# Patient Record
Sex: Male | Born: 1950 | Race: Black or African American | Hispanic: No | Smoking: Never smoker
Health system: Southern US, Community
[De-identification: ages and names within clinical notes are randomized; demographics above are authoritative.]

## PROBLEM LIST (undated history)

## (undated) DIAGNOSIS — Z950 Presence of cardiac pacemaker: Secondary | ICD-10-CM

## (undated) DIAGNOSIS — I5023 Acute on chronic systolic (congestive) heart failure: Secondary | ICD-10-CM

## (undated) DIAGNOSIS — I2510 Atherosclerotic heart disease of native coronary artery without angina pectoris: Secondary | ICD-10-CM

## (undated) DIAGNOSIS — N183 Chronic kidney disease, stage 3 unspecified: Secondary | ICD-10-CM

## (undated) DIAGNOSIS — E7800 Pure hypercholesterolemia, unspecified: Secondary | ICD-10-CM

## (undated) DIAGNOSIS — I4891 Unspecified atrial fibrillation: Secondary | ICD-10-CM

## (undated) DIAGNOSIS — K219 Gastro-esophageal reflux disease without esophagitis: Secondary | ICD-10-CM

## (undated) DIAGNOSIS — G629 Polyneuropathy, unspecified: Secondary | ICD-10-CM

## (undated) DIAGNOSIS — D869 Sarcoidosis, unspecified: Secondary | ICD-10-CM

## (undated) DIAGNOSIS — R972 Elevated prostate specific antigen [PSA]: Principal | ICD-10-CM

## (undated) DIAGNOSIS — I219 Acute myocardial infarction, unspecified: Secondary | ICD-10-CM

## (undated) DIAGNOSIS — I639 Cerebral infarction, unspecified: Secondary | ICD-10-CM

## (undated) DIAGNOSIS — R519 Headache, unspecified: Secondary | ICD-10-CM

## (undated) DIAGNOSIS — Z95810 Presence of automatic (implantable) cardiac defibrillator: Secondary | ICD-10-CM

## (undated) DIAGNOSIS — I420 Dilated cardiomyopathy: Secondary | ICD-10-CM

## (undated) DIAGNOSIS — I472 Ventricular tachycardia: Secondary | ICD-10-CM

## (undated) DIAGNOSIS — Z9981 Dependence on supplemental oxygen: Secondary | ICD-10-CM

## (undated) DIAGNOSIS — I4729 Other ventricular tachycardia: Secondary | ICD-10-CM

## (undated) DIAGNOSIS — I209 Angina pectoris, unspecified: Secondary | ICD-10-CM

## (undated) DIAGNOSIS — R51 Headache: Secondary | ICD-10-CM

## (undated) DIAGNOSIS — F419 Anxiety disorder, unspecified: Secondary | ICD-10-CM

## (undated) DIAGNOSIS — I509 Heart failure, unspecified: Secondary | ICD-10-CM

## (undated) DIAGNOSIS — M109 Gout, unspecified: Secondary | ICD-10-CM

## (undated) DIAGNOSIS — I10 Essential (primary) hypertension: Secondary | ICD-10-CM

## (undated) HISTORY — DX: Presence of automatic (implantable) cardiac defibrillator: Z95.810

## (undated) HISTORY — DX: Gastro-esophageal reflux disease without esophagitis: K21.9

## (undated) HISTORY — DX: Chronic kidney disease, stage 3 unspecified: N18.30

## (undated) HISTORY — DX: Cerebral infarction, unspecified: I63.9

## (undated) HISTORY — DX: Ventricular tachycardia: I47.2

## (undated) HISTORY — DX: Other ventricular tachycardia: I47.29

## (undated) HISTORY — DX: Pure hypercholesterolemia, unspecified: E78.00

## (undated) HISTORY — DX: Acute on chronic systolic (congestive) heart failure: I50.23

## (undated) HISTORY — DX: Unspecified atrial fibrillation: I48.91

## (undated) HISTORY — PX: BACK SURGERY: SHX140

## (undated) HISTORY — DX: Elevated prostate specific antigen (PSA): R97.20

## (undated) HISTORY — DX: Chronic kidney disease, stage 3 (moderate): N18.3

## (undated) HISTORY — PX: INSERT / REPLACE / REMOVE PACEMAKER: SUR710

## (undated) HISTORY — PX: CARDIAC CATHETERIZATION: SHX172

---

## 1997-11-21 ENCOUNTER — Emergency Department (HOSPITAL_COMMUNITY): Admission: EM | Admit: 1997-11-21 | Discharge: 1997-11-21 | Payer: Self-pay | Admitting: Emergency Medicine

## 1999-11-30 ENCOUNTER — Encounter: Payer: Self-pay | Admitting: Emergency Medicine

## 1999-11-30 ENCOUNTER — Emergency Department (HOSPITAL_COMMUNITY): Admission: EM | Admit: 1999-11-30 | Discharge: 1999-11-30 | Payer: Self-pay | Admitting: Emergency Medicine

## 2000-03-06 ENCOUNTER — Emergency Department (HOSPITAL_COMMUNITY): Admission: EM | Admit: 2000-03-06 | Discharge: 2000-03-06 | Payer: Self-pay | Admitting: Emergency Medicine

## 2000-03-06 ENCOUNTER — Encounter: Payer: Self-pay | Admitting: Emergency Medicine

## 2000-11-24 ENCOUNTER — Emergency Department (HOSPITAL_COMMUNITY): Admission: EM | Admit: 2000-11-24 | Discharge: 2000-11-24 | Payer: Self-pay | Admitting: Emergency Medicine

## 2001-06-01 ENCOUNTER — Encounter: Payer: Self-pay | Admitting: Emergency Medicine

## 2001-06-01 ENCOUNTER — Emergency Department (HOSPITAL_COMMUNITY): Admission: EM | Admit: 2001-06-01 | Discharge: 2001-06-01 | Payer: Self-pay | Admitting: Emergency Medicine

## 2002-01-29 ENCOUNTER — Encounter: Payer: Self-pay | Admitting: Emergency Medicine

## 2002-01-29 ENCOUNTER — Emergency Department (HOSPITAL_COMMUNITY): Admission: EM | Admit: 2002-01-29 | Discharge: 2002-01-29 | Payer: Self-pay | Admitting: Emergency Medicine

## 2006-04-26 ENCOUNTER — Inpatient Hospital Stay (HOSPITAL_COMMUNITY): Admission: EM | Admit: 2006-04-26 | Discharge: 2006-05-02 | Payer: Self-pay | Admitting: Emergency Medicine

## 2006-10-25 ENCOUNTER — Emergency Department (HOSPITAL_COMMUNITY): Admission: EM | Admit: 2006-10-25 | Discharge: 2006-10-25 | Payer: Self-pay | Admitting: Emergency Medicine

## 2007-01-03 HISTORY — PX: PACEMAKER INSERTION: SHX728

## 2007-01-06 ENCOUNTER — Ambulatory Visit: Payer: Self-pay | Admitting: Internal Medicine

## 2007-01-06 ENCOUNTER — Inpatient Hospital Stay (HOSPITAL_COMMUNITY): Admission: EM | Admit: 2007-01-06 | Discharge: 2007-01-11 | Payer: Self-pay | Admitting: Emergency Medicine

## 2007-01-08 ENCOUNTER — Encounter (INDEPENDENT_AMBULATORY_CARE_PROVIDER_SITE_OTHER): Payer: Self-pay | Admitting: Cardiology

## 2007-01-14 ENCOUNTER — Ambulatory Visit: Payer: Self-pay | Admitting: Internal Medicine

## 2007-01-14 ENCOUNTER — Ambulatory Visit (HOSPITAL_COMMUNITY): Admission: RE | Admit: 2007-01-14 | Discharge: 2007-01-15 | Payer: Self-pay | Admitting: Internal Medicine

## 2007-01-17 ENCOUNTER — Inpatient Hospital Stay (HOSPITAL_COMMUNITY): Admission: EM | Admit: 2007-01-17 | Discharge: 2007-01-19 | Payer: Self-pay | Admitting: Emergency Medicine

## 2007-01-17 ENCOUNTER — Ambulatory Visit: Payer: Self-pay | Admitting: Internal Medicine

## 2007-01-18 ENCOUNTER — Encounter: Payer: Self-pay | Admitting: Internal Medicine

## 2007-01-30 ENCOUNTER — Ambulatory Visit: Payer: Self-pay

## 2007-02-21 ENCOUNTER — Emergency Department (HOSPITAL_COMMUNITY): Admission: EM | Admit: 2007-02-21 | Discharge: 2007-02-21 | Payer: Self-pay | Admitting: Emergency Medicine

## 2007-03-05 ENCOUNTER — Emergency Department (HOSPITAL_COMMUNITY): Admission: EM | Admit: 2007-03-05 | Discharge: 2007-03-05 | Payer: Self-pay | Admitting: Emergency Medicine

## 2007-03-05 ENCOUNTER — Ambulatory Visit: Payer: Self-pay | Admitting: Cardiology

## 2007-03-20 ENCOUNTER — Ambulatory Visit: Payer: Self-pay | Admitting: Internal Medicine

## 2007-03-27 ENCOUNTER — Emergency Department (HOSPITAL_COMMUNITY): Admission: EM | Admit: 2007-03-27 | Discharge: 2007-03-27 | Payer: Self-pay | Admitting: Emergency Medicine

## 2007-04-23 ENCOUNTER — Inpatient Hospital Stay (HOSPITAL_COMMUNITY): Admission: AD | Admit: 2007-04-23 | Discharge: 2007-04-26 | Payer: Self-pay | Admitting: *Deleted

## 2007-06-30 ENCOUNTER — Inpatient Hospital Stay (HOSPITAL_COMMUNITY): Admission: EM | Admit: 2007-06-30 | Discharge: 2007-07-02 | Payer: Self-pay | Admitting: Emergency Medicine

## 2007-08-05 ENCOUNTER — Inpatient Hospital Stay (HOSPITAL_COMMUNITY): Admission: AD | Admit: 2007-08-05 | Discharge: 2007-08-09 | Payer: Self-pay | Admitting: *Deleted

## 2007-09-17 ENCOUNTER — Encounter (INDEPENDENT_AMBULATORY_CARE_PROVIDER_SITE_OTHER): Payer: Self-pay | Admitting: *Deleted

## 2007-09-17 ENCOUNTER — Ambulatory Visit (HOSPITAL_COMMUNITY): Admission: RE | Admit: 2007-09-17 | Discharge: 2007-09-17 | Payer: Self-pay | Admitting: *Deleted

## 2007-09-17 ENCOUNTER — Ambulatory Visit: Payer: Self-pay | Admitting: Vascular Surgery

## 2007-09-20 ENCOUNTER — Emergency Department (HOSPITAL_COMMUNITY): Admission: EM | Admit: 2007-09-20 | Discharge: 2007-09-20 | Payer: Self-pay | Admitting: Emergency Medicine

## 2007-10-08 IMAGING — CR DG CHEST 2V
2 series · 2 of 2 positions shown · non-contrast
Comparison: None.

CLINICAL DATA: Short of breath. 
 CHEST - 2 VIEW:

[w chest pa]
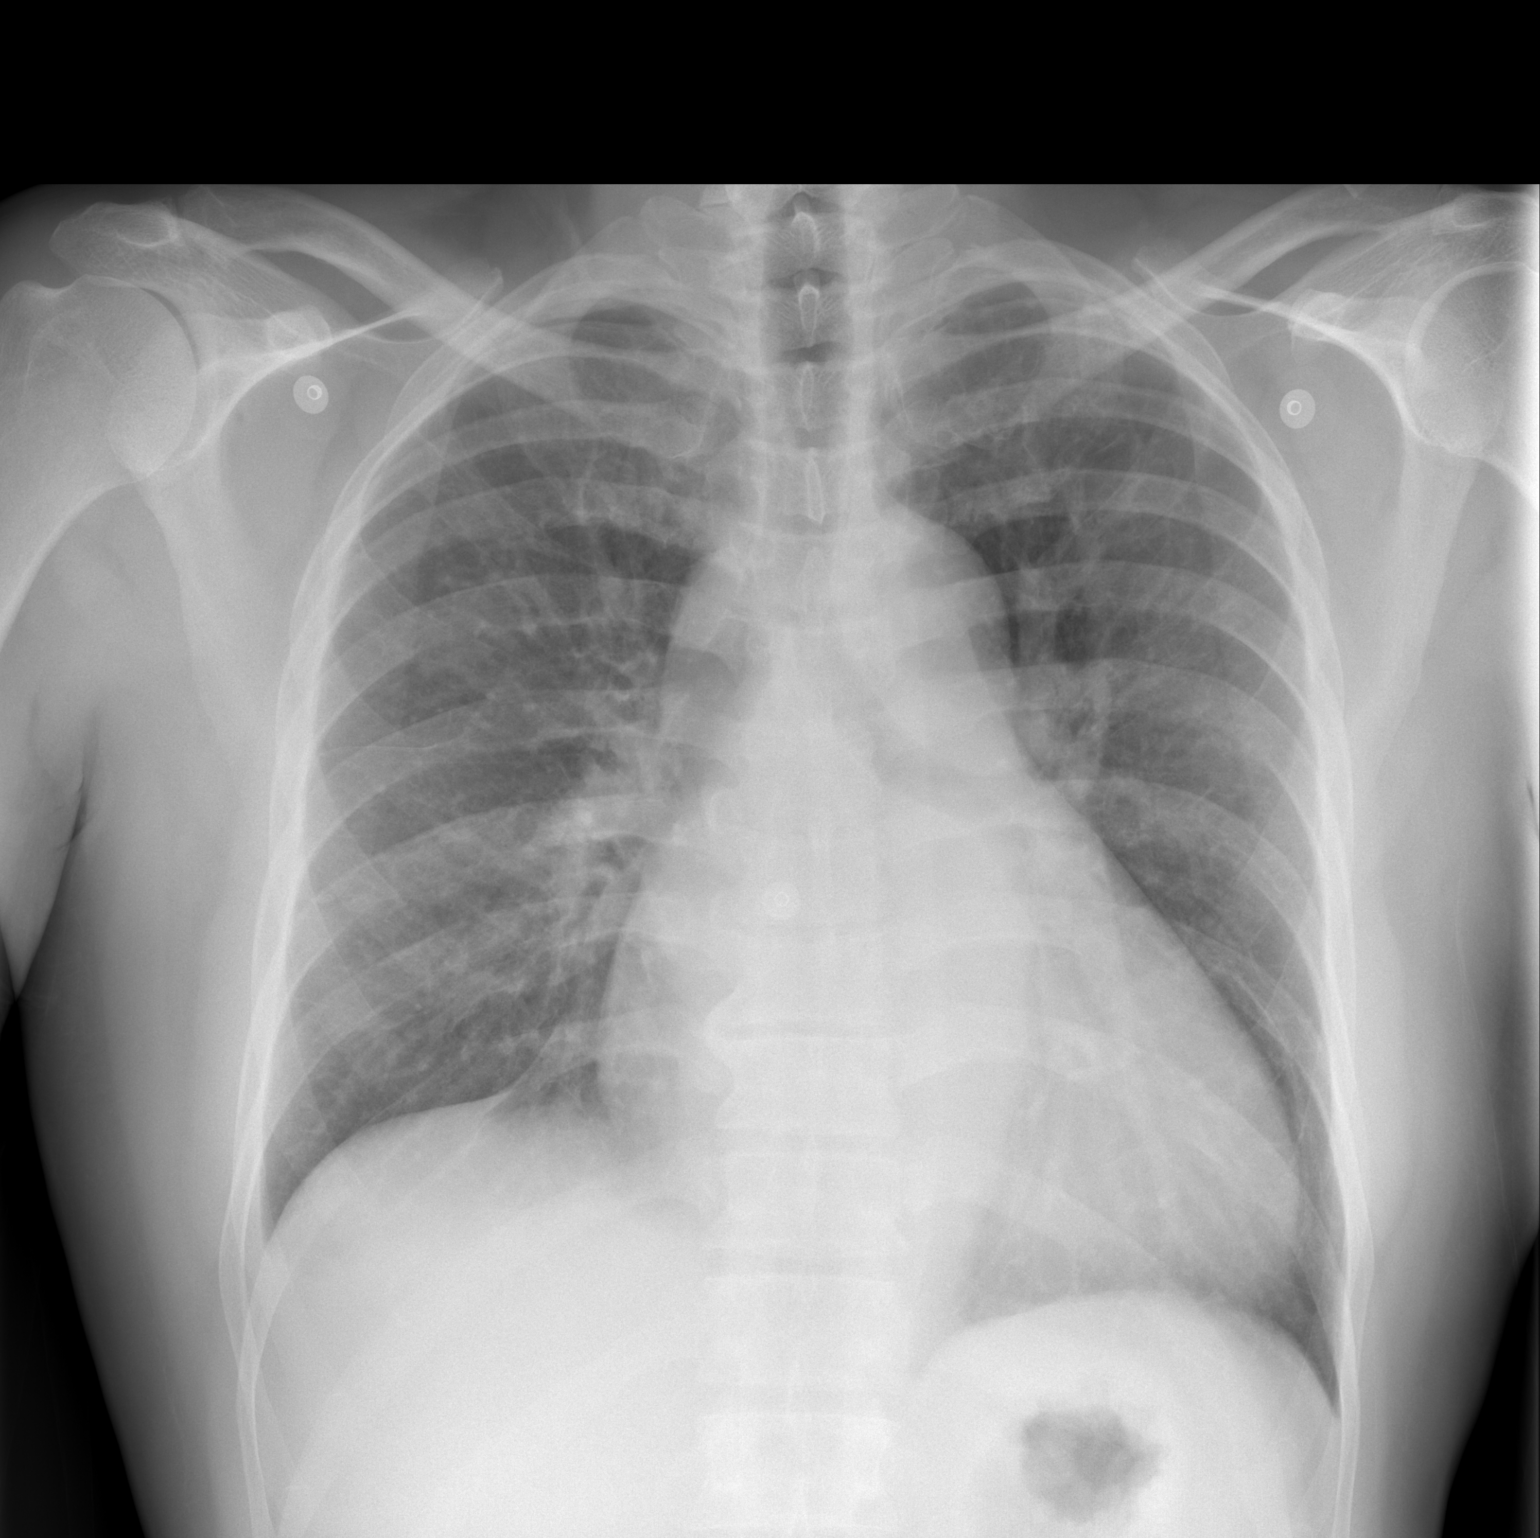

[w chest lat]
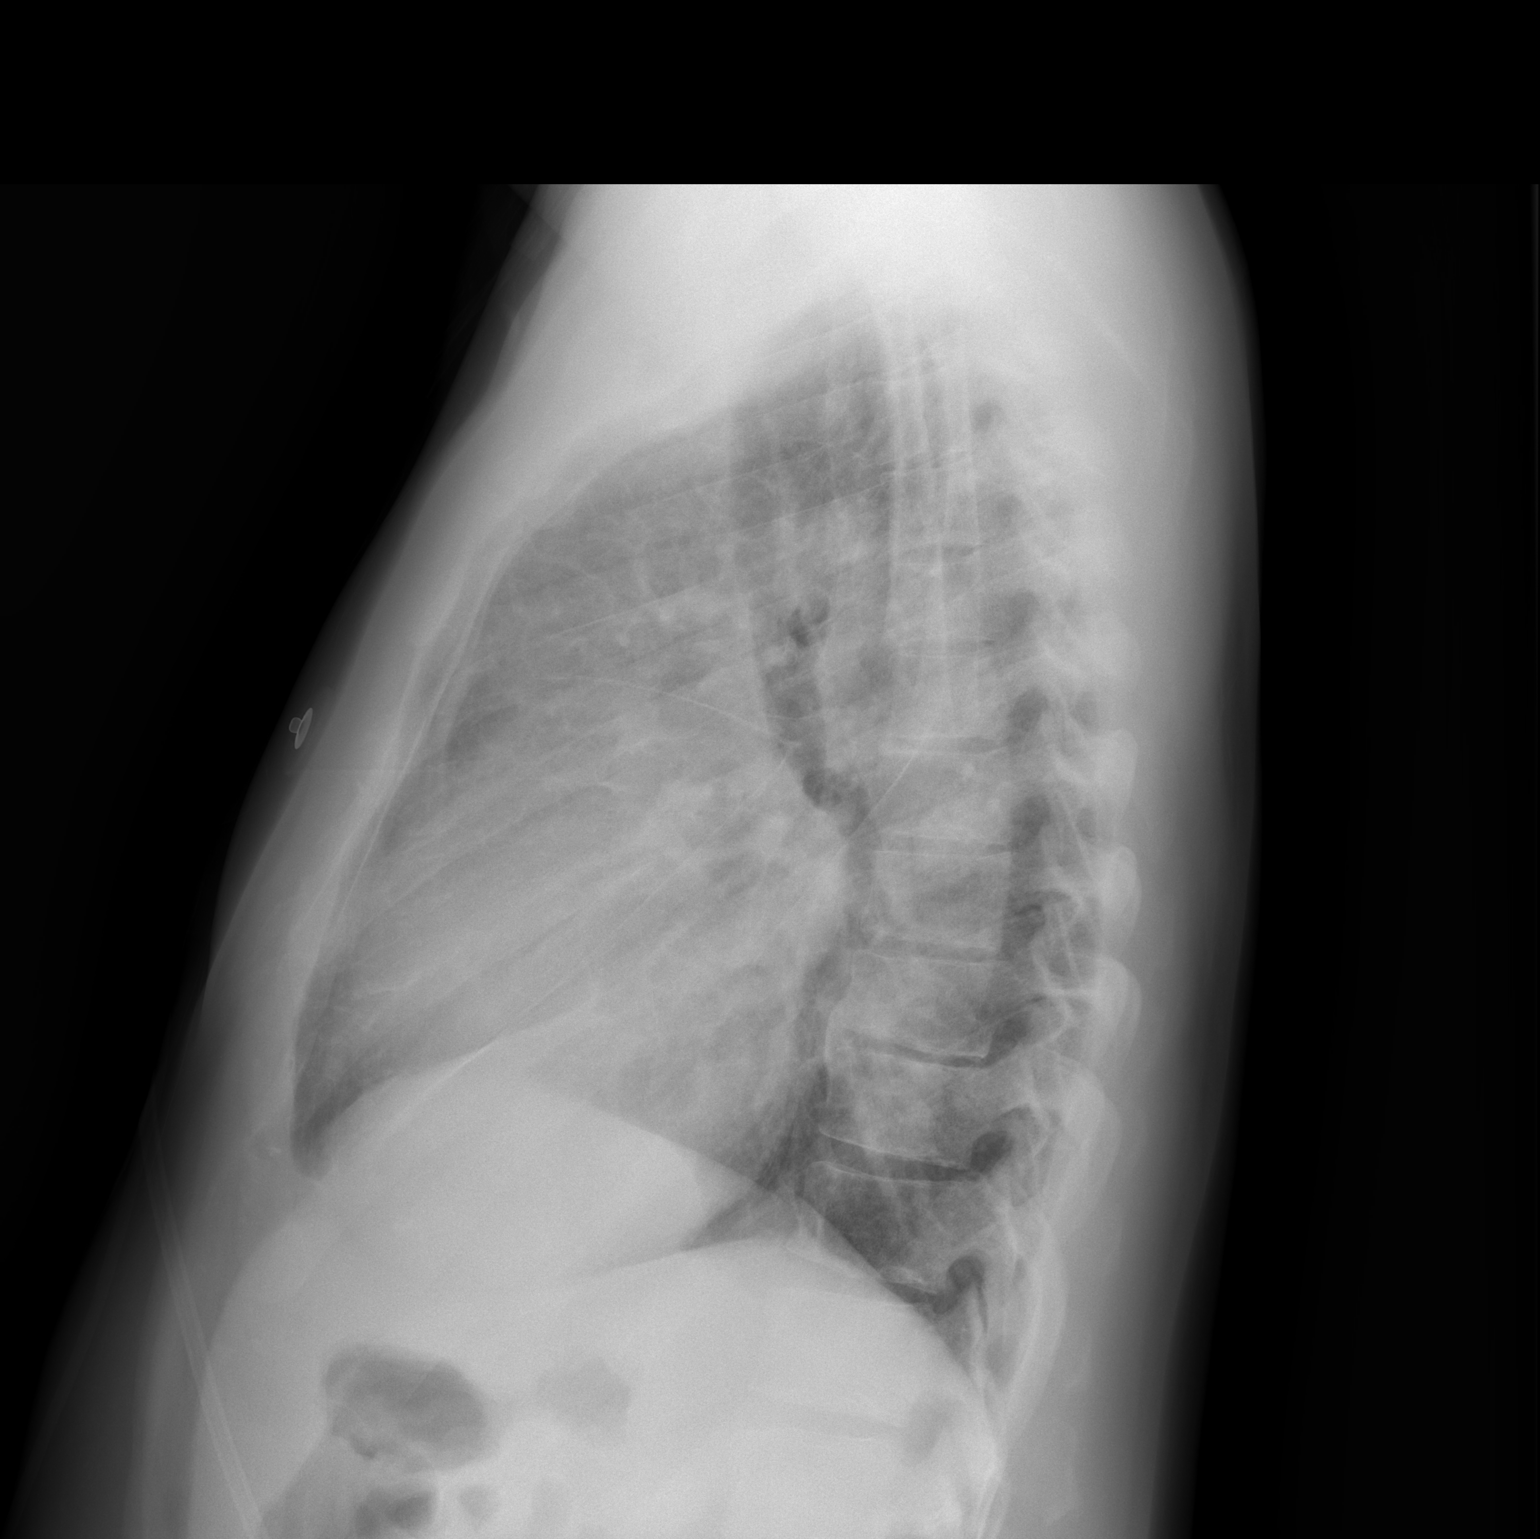

[2 of 2 positions shown; findings below may reference images not displayed]

FINDINGS: The heart is enlarged.  There is venous hypertension without frank congestive heart failure.  There may be a few Aufmim?Muri lines but there is no pleural fluid.  There is no pulmonary air space disease.
IMPRESSION: Cardiomegaly with venous hypertension ? no frank congestive heart failure or active disease.

## 2008-01-27 ENCOUNTER — Inpatient Hospital Stay (HOSPITAL_COMMUNITY): Admission: EM | Admit: 2008-01-27 | Discharge: 2008-02-05 | Payer: Self-pay | Admitting: Emergency Medicine

## 2008-01-27 ENCOUNTER — Ambulatory Visit: Payer: Self-pay | Admitting: Cardiology

## 2008-02-04 ENCOUNTER — Encounter (INDEPENDENT_AMBULATORY_CARE_PROVIDER_SITE_OTHER): Payer: Self-pay | Admitting: Gastroenterology

## 2008-04-07 IMAGING — CR DG CHEST 1V PORT
1 series · 1 of 1 positions shown · non-contrast
Comparison: 04/26/06.

CLINICAL DATA: Chest pain.  Short of breath.  Cough.
 PORTABLE CHEST ([DATE]):

[view not recorded]
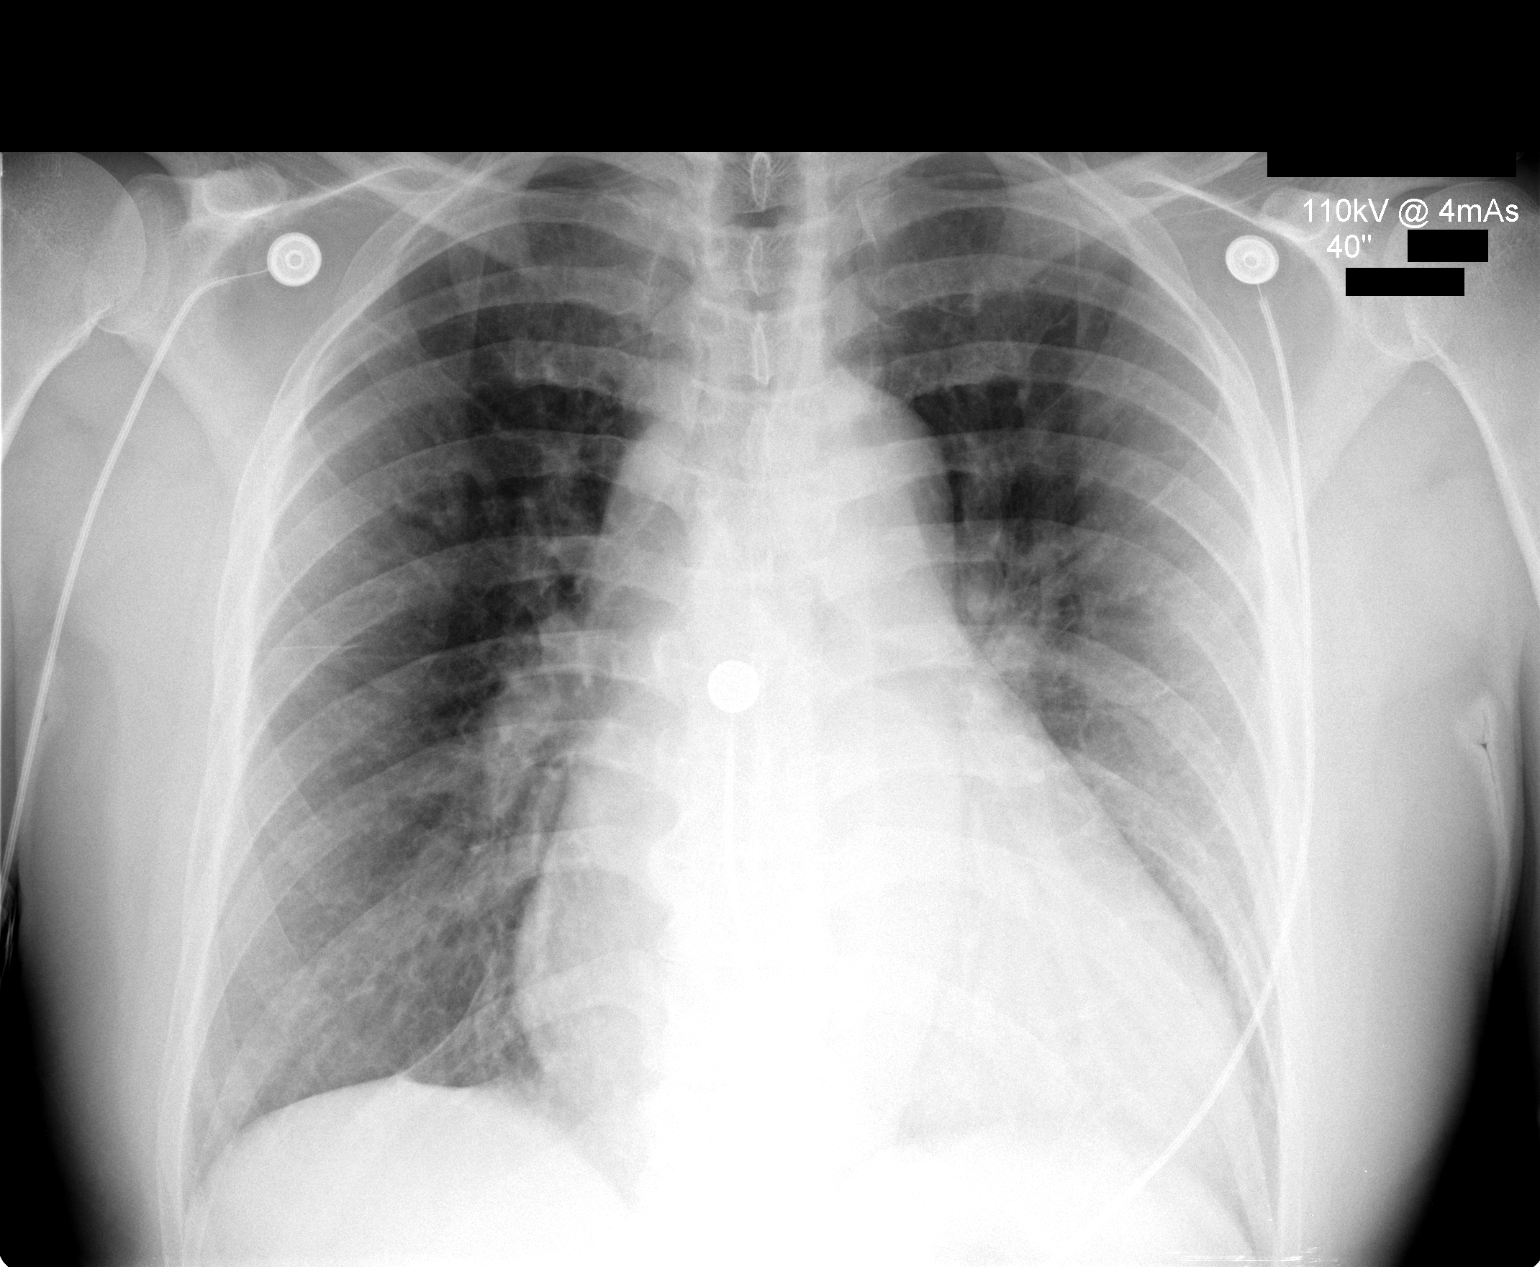

[1 of 1 positions shown; findings below may reference images not displayed]

FINDINGS: Heart size is enlarged.  There is mild vascular congestion without edema.  There is some mild left lower lobe atelectasis.  There is no effusion.
IMPRESSION: Cardiac enlargement with vascular congestion.  No significant edema.  There is mild left lower lobe atelectasis.

## 2008-06-21 ENCOUNTER — Emergency Department (HOSPITAL_COMMUNITY): Admission: EM | Admit: 2008-06-21 | Discharge: 2008-06-21 | Payer: Self-pay | Admitting: Emergency Medicine

## 2008-06-28 IMAGING — CR DG CHEST 2V
2 series · 2 of 2 positions shown · non-contrast
Comparison: 01/06/07.

CLINICAL DATA: Post pacer insertion.
 CHEST ? 2 VIEW:

[w chest pa]
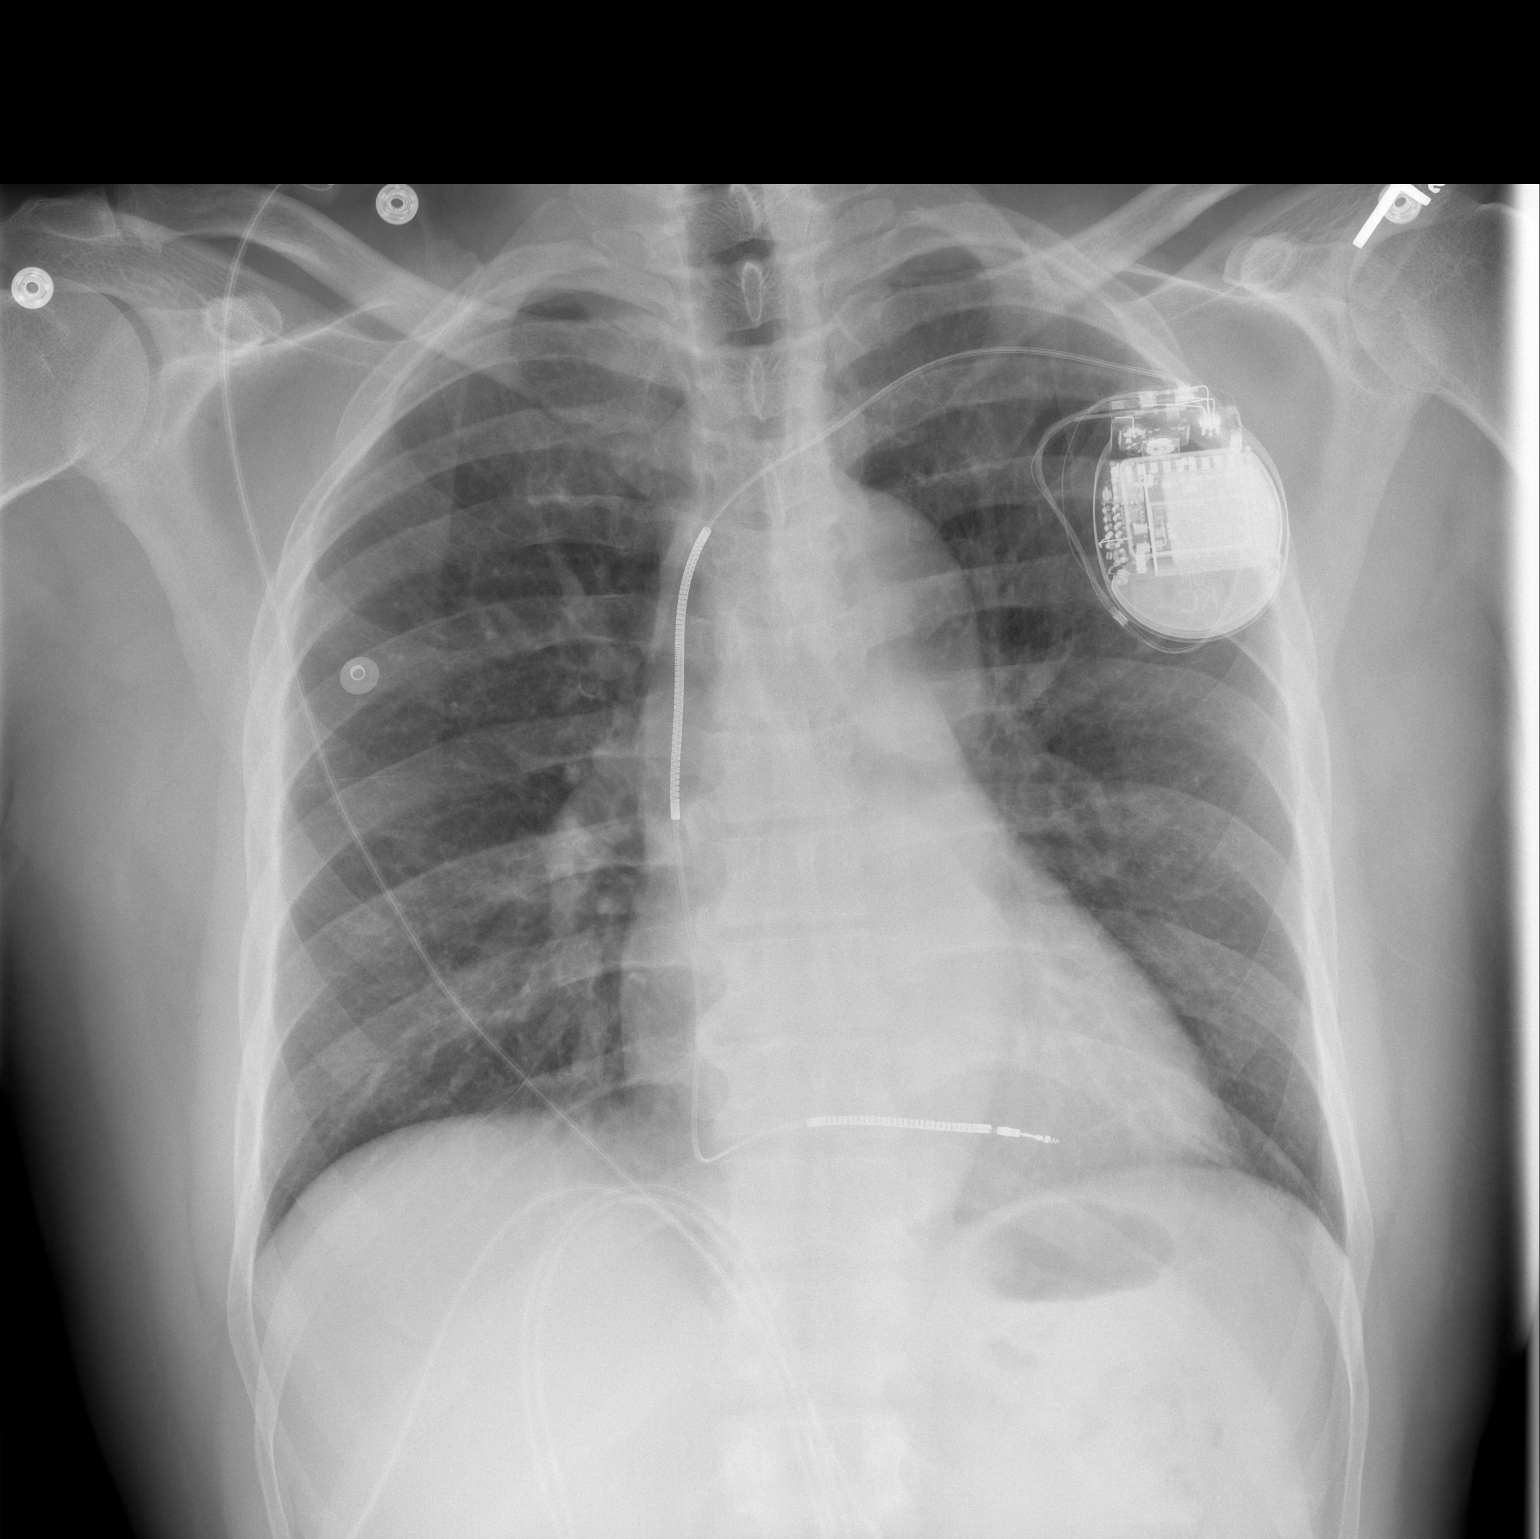

[w chest lat]
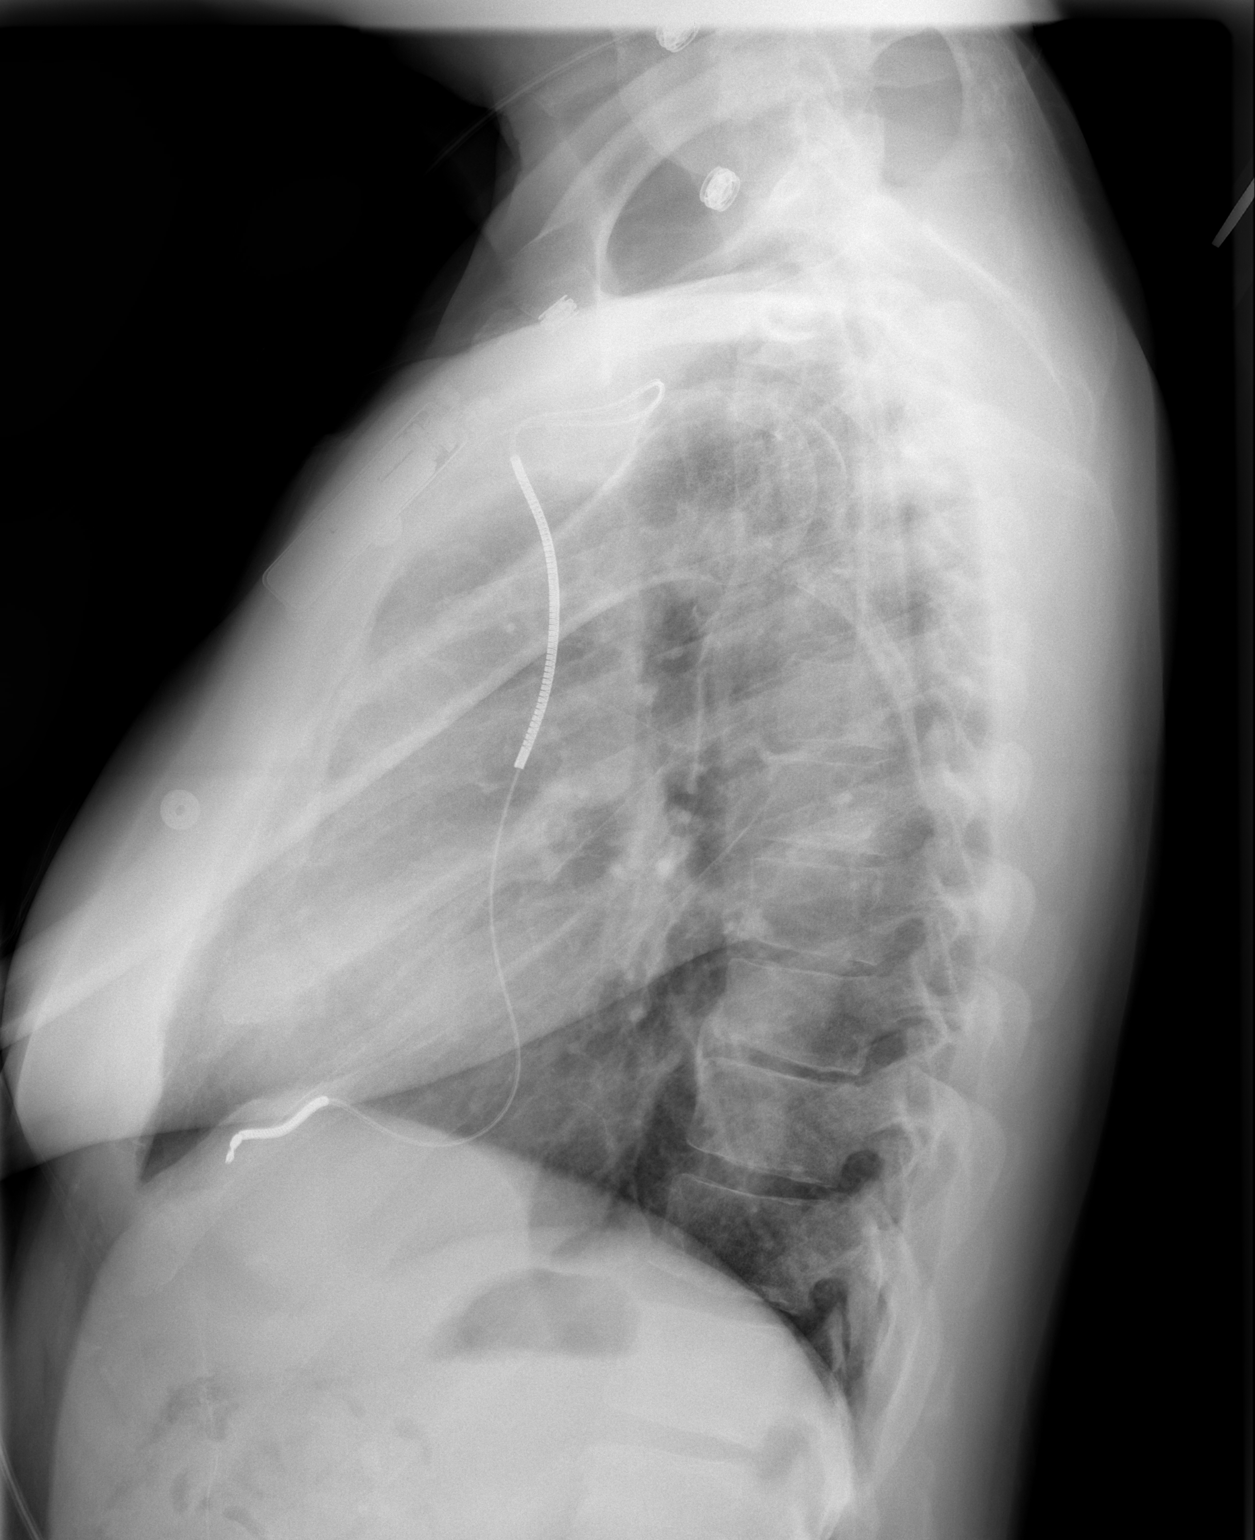

[2 of 2 positions shown; findings below may reference images not displayed]

FINDINGS: Two views of the chest show a permanent pacer present with AICD lead.  The lungs are clear, and no pneumothorax is noted.  Cardiomegaly is stable.
IMPRESSION: Permanent pacer now present with AICD lead.  No pneumothorax.

## 2008-08-04 IMAGING — CT CT HEAD W/O CM
1 series · 16 of 30 positions shown, 20 images · IV contrast (agent unspecified)
Comparison: none

CLINICAL DATA: Short of breath. Headache.
 HEAD CT WITHOUT CONTRAST:
TECHNIQUE: Contiguous axial images were obtained from the base of the skull through the vertex according to standard protocol without contrast.

[Series 2: head_seq 4.5 h37s st · axial · 0.43mm/px · z∈[+1026,+1170]mm · 16 of 36 slices shown, 20 images]
[im 2/36  brain]
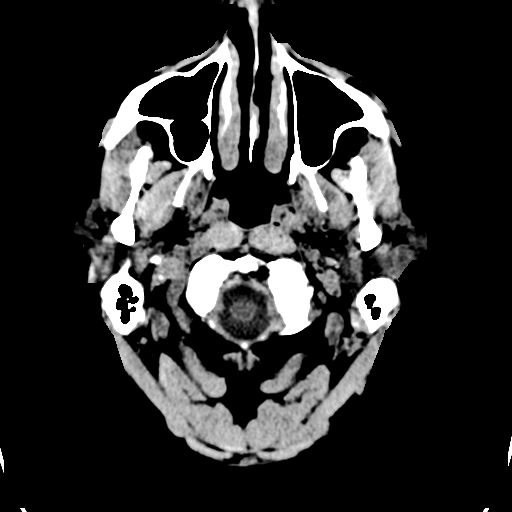
[im 2/36  bone]
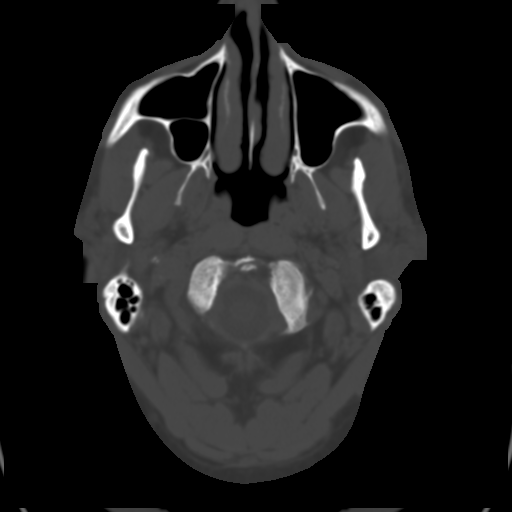
[im 4/36  brain]
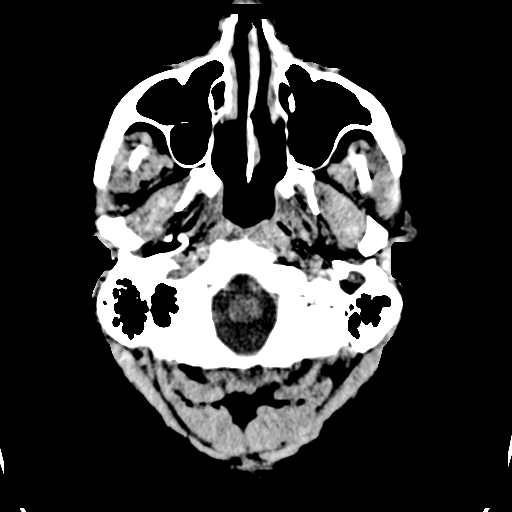
[im 7/36  brain]
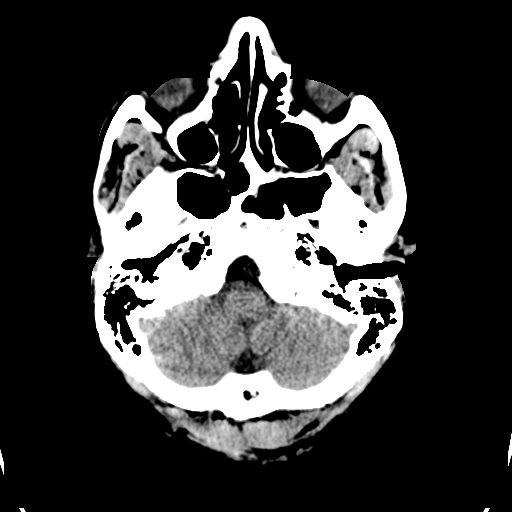
[im 9/36  brain]
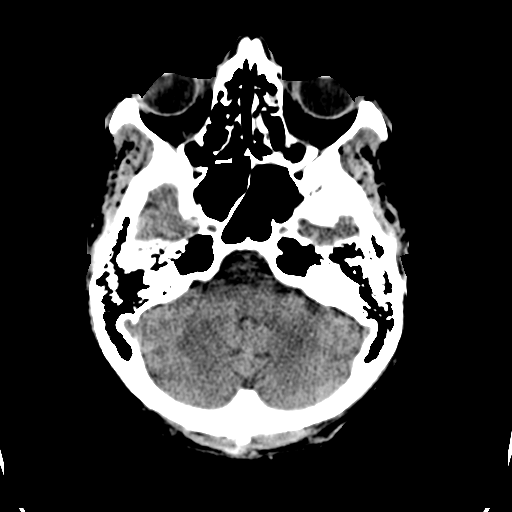
[im 10/36  brain]
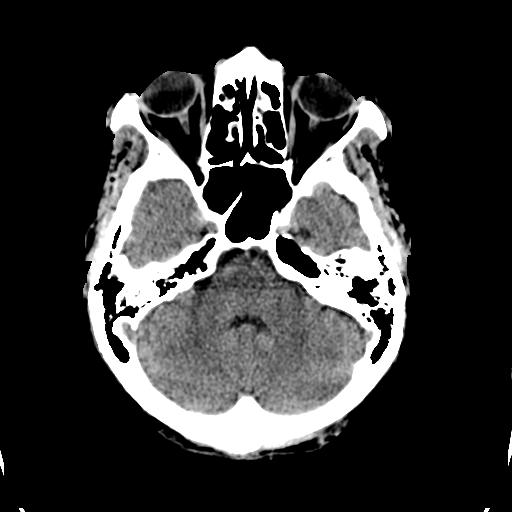
[im 10/36  bone]
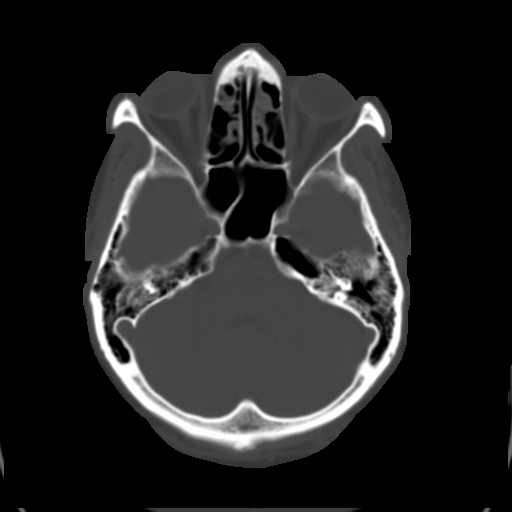
[im 13/36  brain]
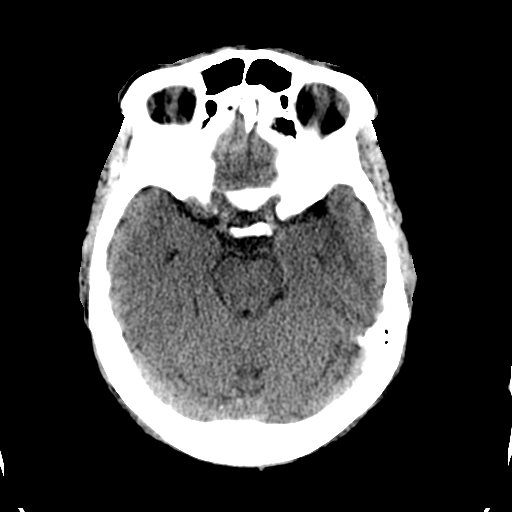
[im 15/36  brain]
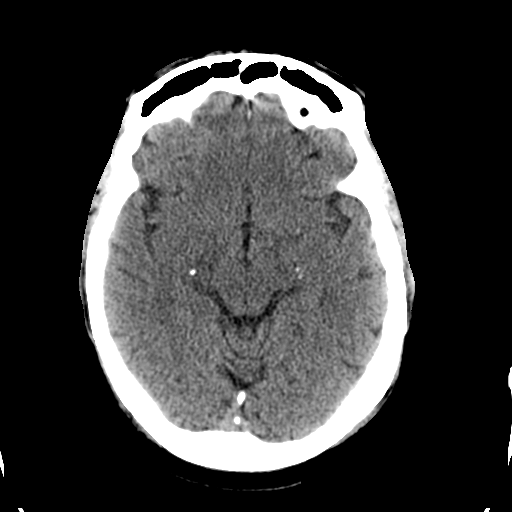
[im 17/36  brain]
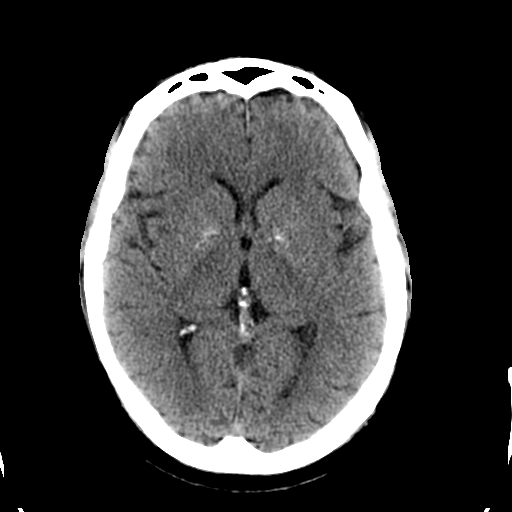
[im 19/36  brain]
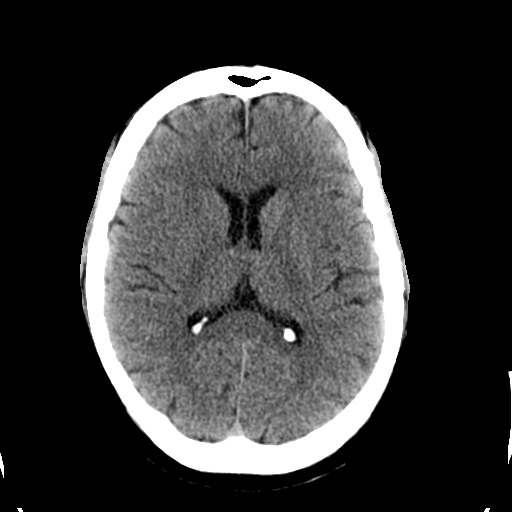
[im 19/36  bone]
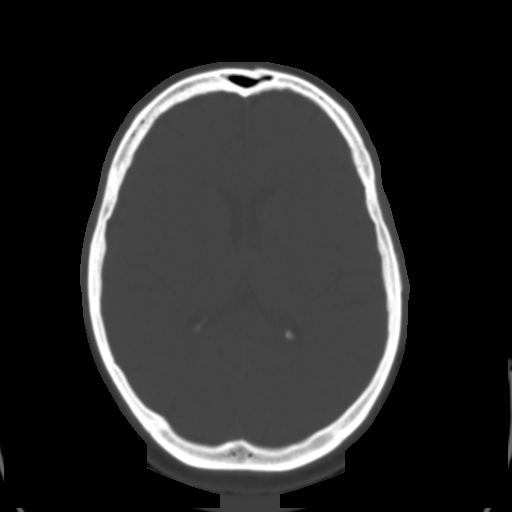
[im 21/36  brain]
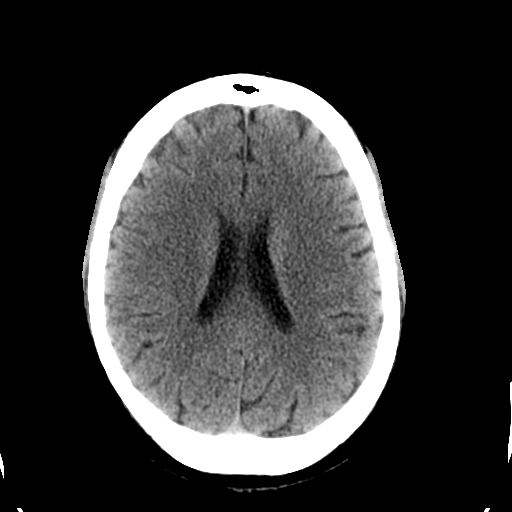
[im 23/36  brain]
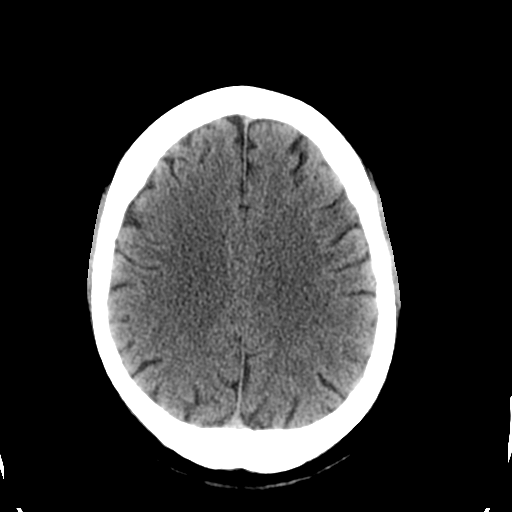
[im 26/36  brain]
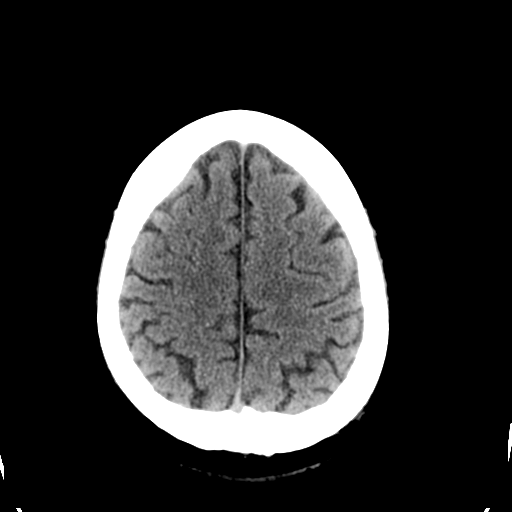
[im 27/36  brain]
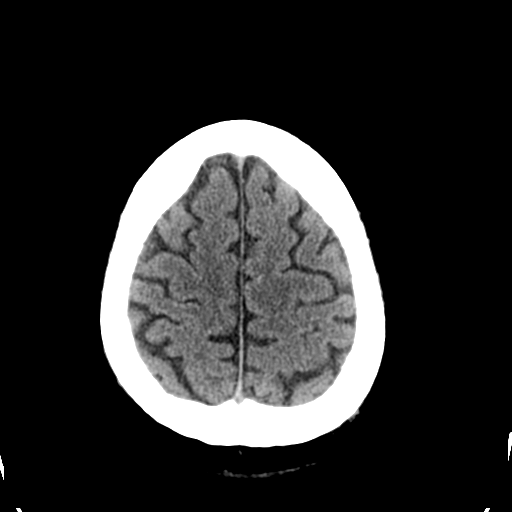
[im 27/36  bone]
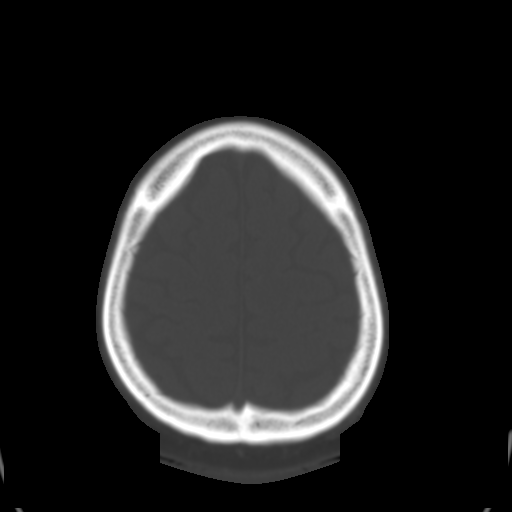
[im 29/36  brain]
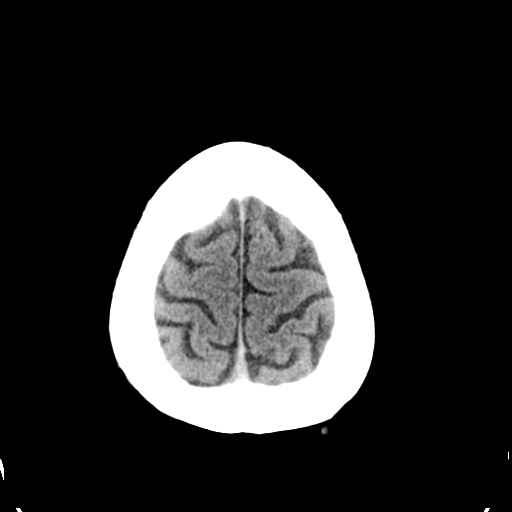
[im 32/36  brain]
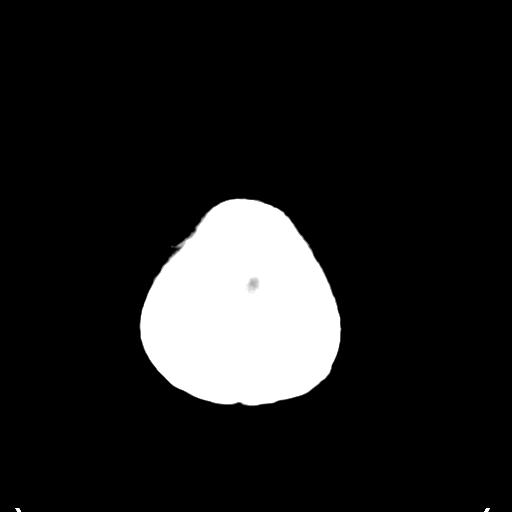
[im 34/36  brain]
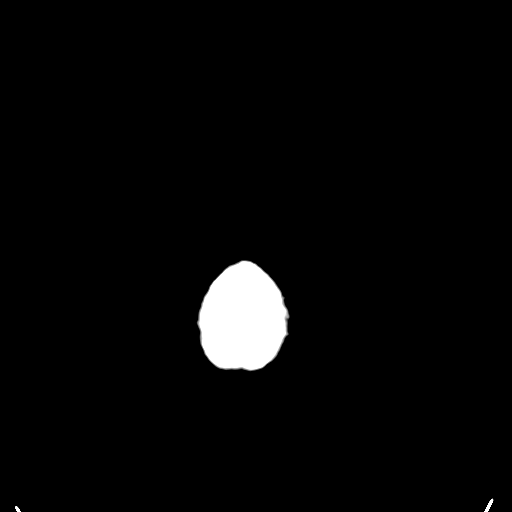

[16 of 30 positions shown; findings below may reference images not displayed]

FINDINGS: There is no mass effect, midline shift, or acute intracranial hemorrhage. Minimal chronic ischemic changes are present in the periventricular white matter. Mastoid air cells are clear. Mucosal thickening in the paranasal sinuses is present compatible with chronic inflammatory changes in the sinuses.
IMPRESSION: No acute intracranial pathology.  Chronic changes.

## 2008-08-11 ENCOUNTER — Inpatient Hospital Stay (HOSPITAL_COMMUNITY): Admission: AD | Admit: 2008-08-11 | Discharge: 2008-08-14 | Payer: Self-pay | Admitting: *Deleted

## 2008-10-04 IMAGING — CR DG CHEST 2V
2 series · 2 of 2 positions shown · non-contrast
Comparison: 03/05/2007

CLINICAL DATA: CHF, chest pain

CHEST - 2 VIEW

[w chest pa]
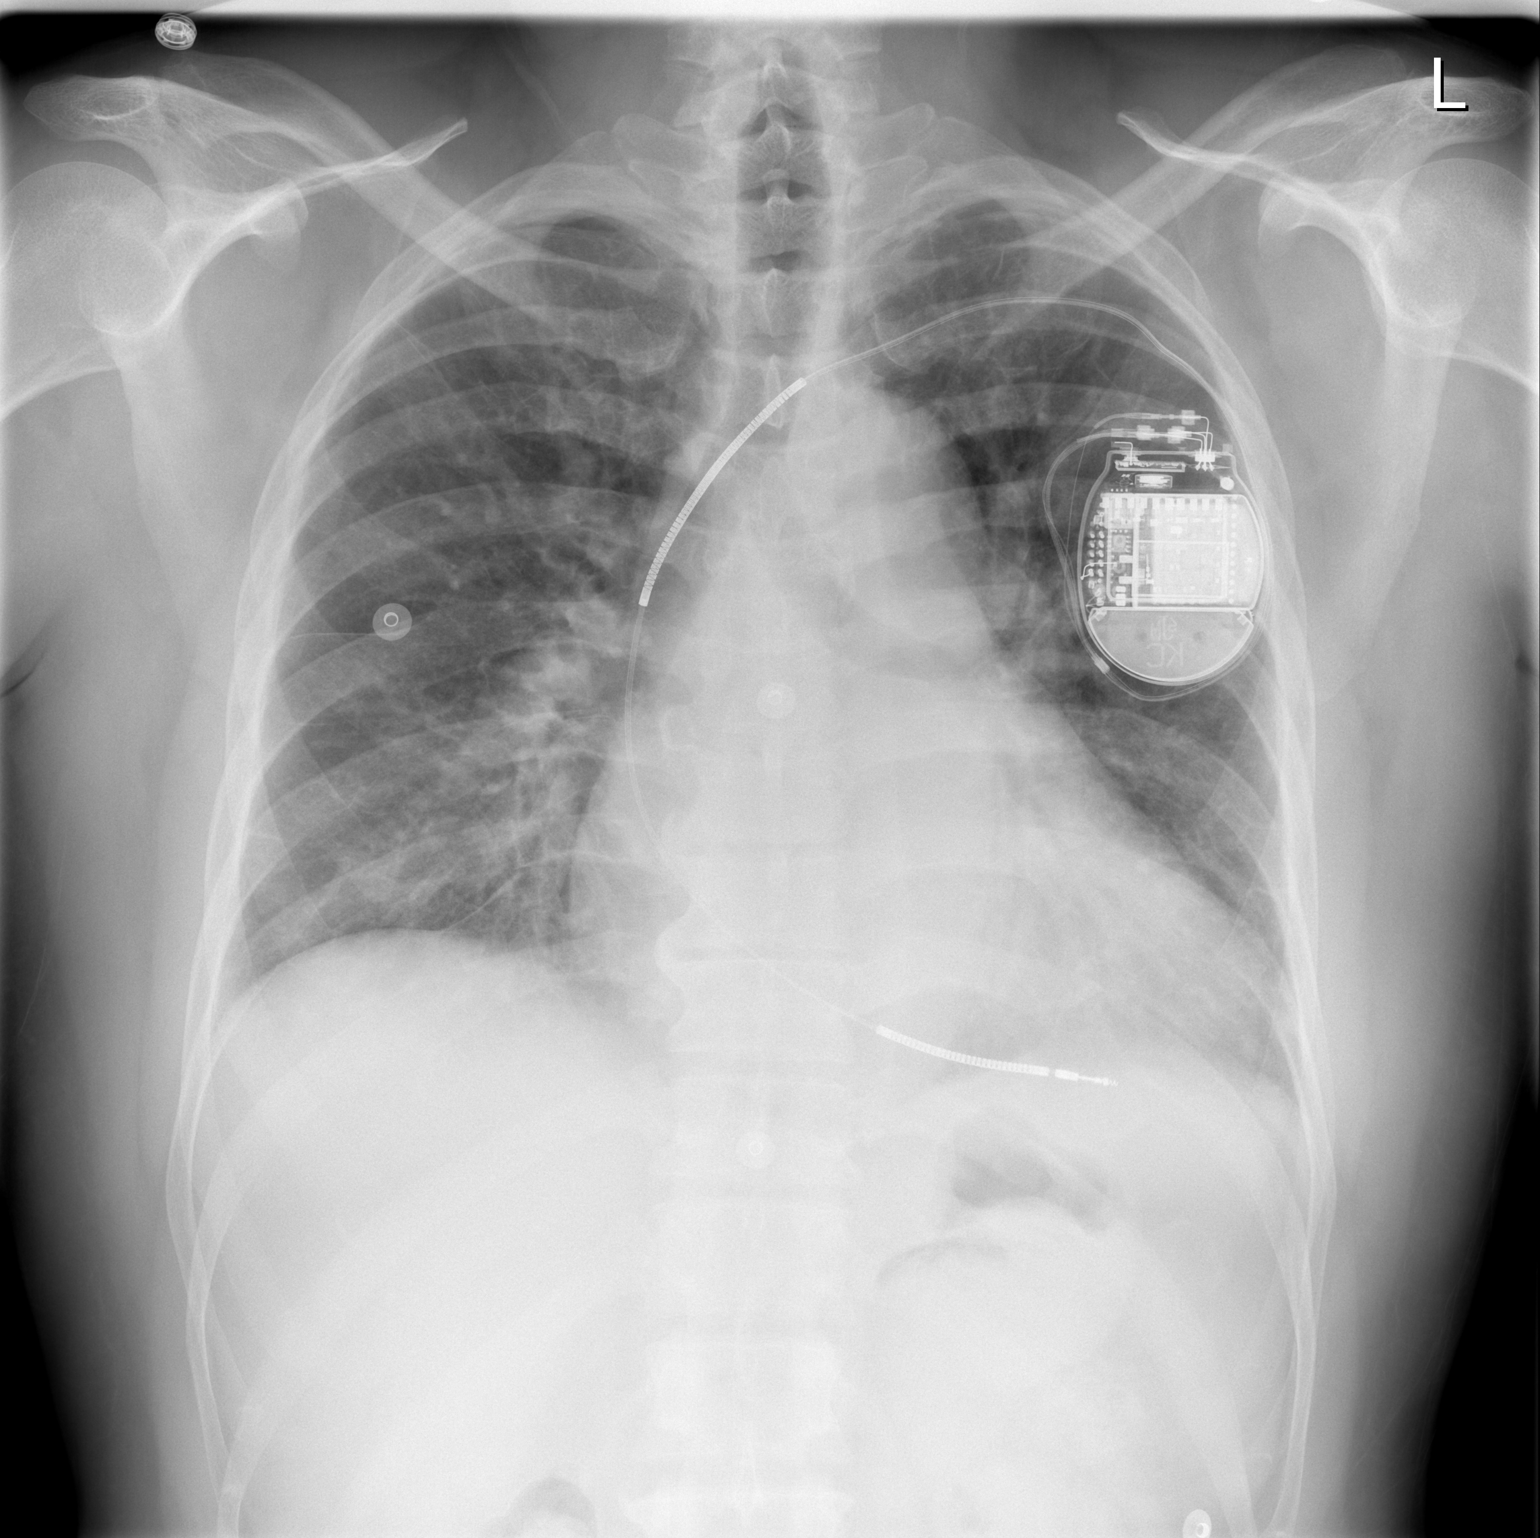

[w chest ap]
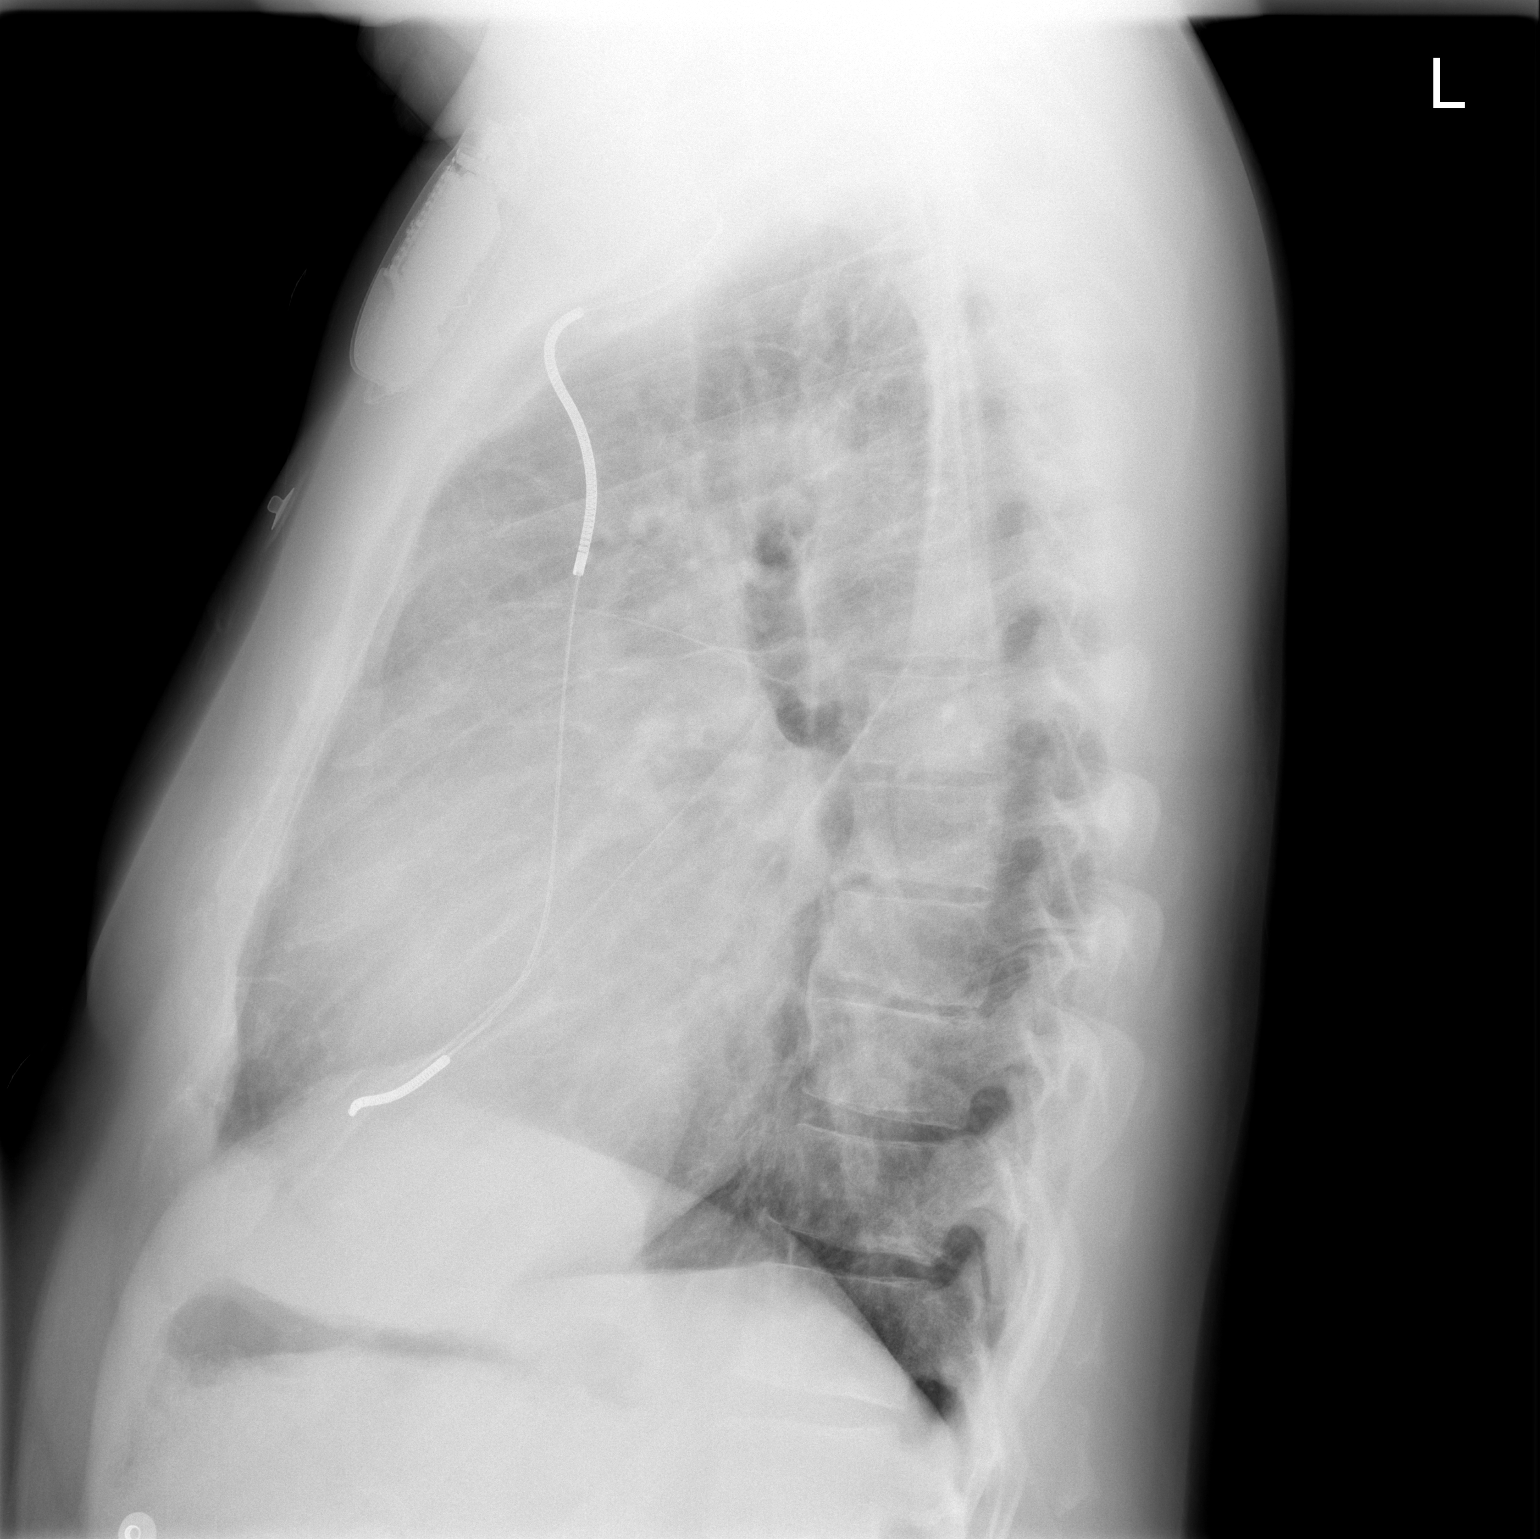

[2 of 2 positions shown; findings below may reference images not displayed]

FINDINGS: Left subclavian AICD stable.  Cardiomegaly stable.  Mild
perihilar and bibasilar interstitial infiltrates or edema slightly
more conspicuous than on prior study.  No effusion.  Septal lines
seen at the periphery of both lung bases.
IMPRESSION: 1.  Stable cardiomegaly with probable early interstitial edema.

## 2008-12-11 IMAGING — CR DG CHEST 2V
2 series · 2 of 2 positions shown · non-contrast
Comparison: 04/23/2007

CLINICAL DATA: Chest pain shortness of breath

CHEST - 2 VIEW

[w chest pa]
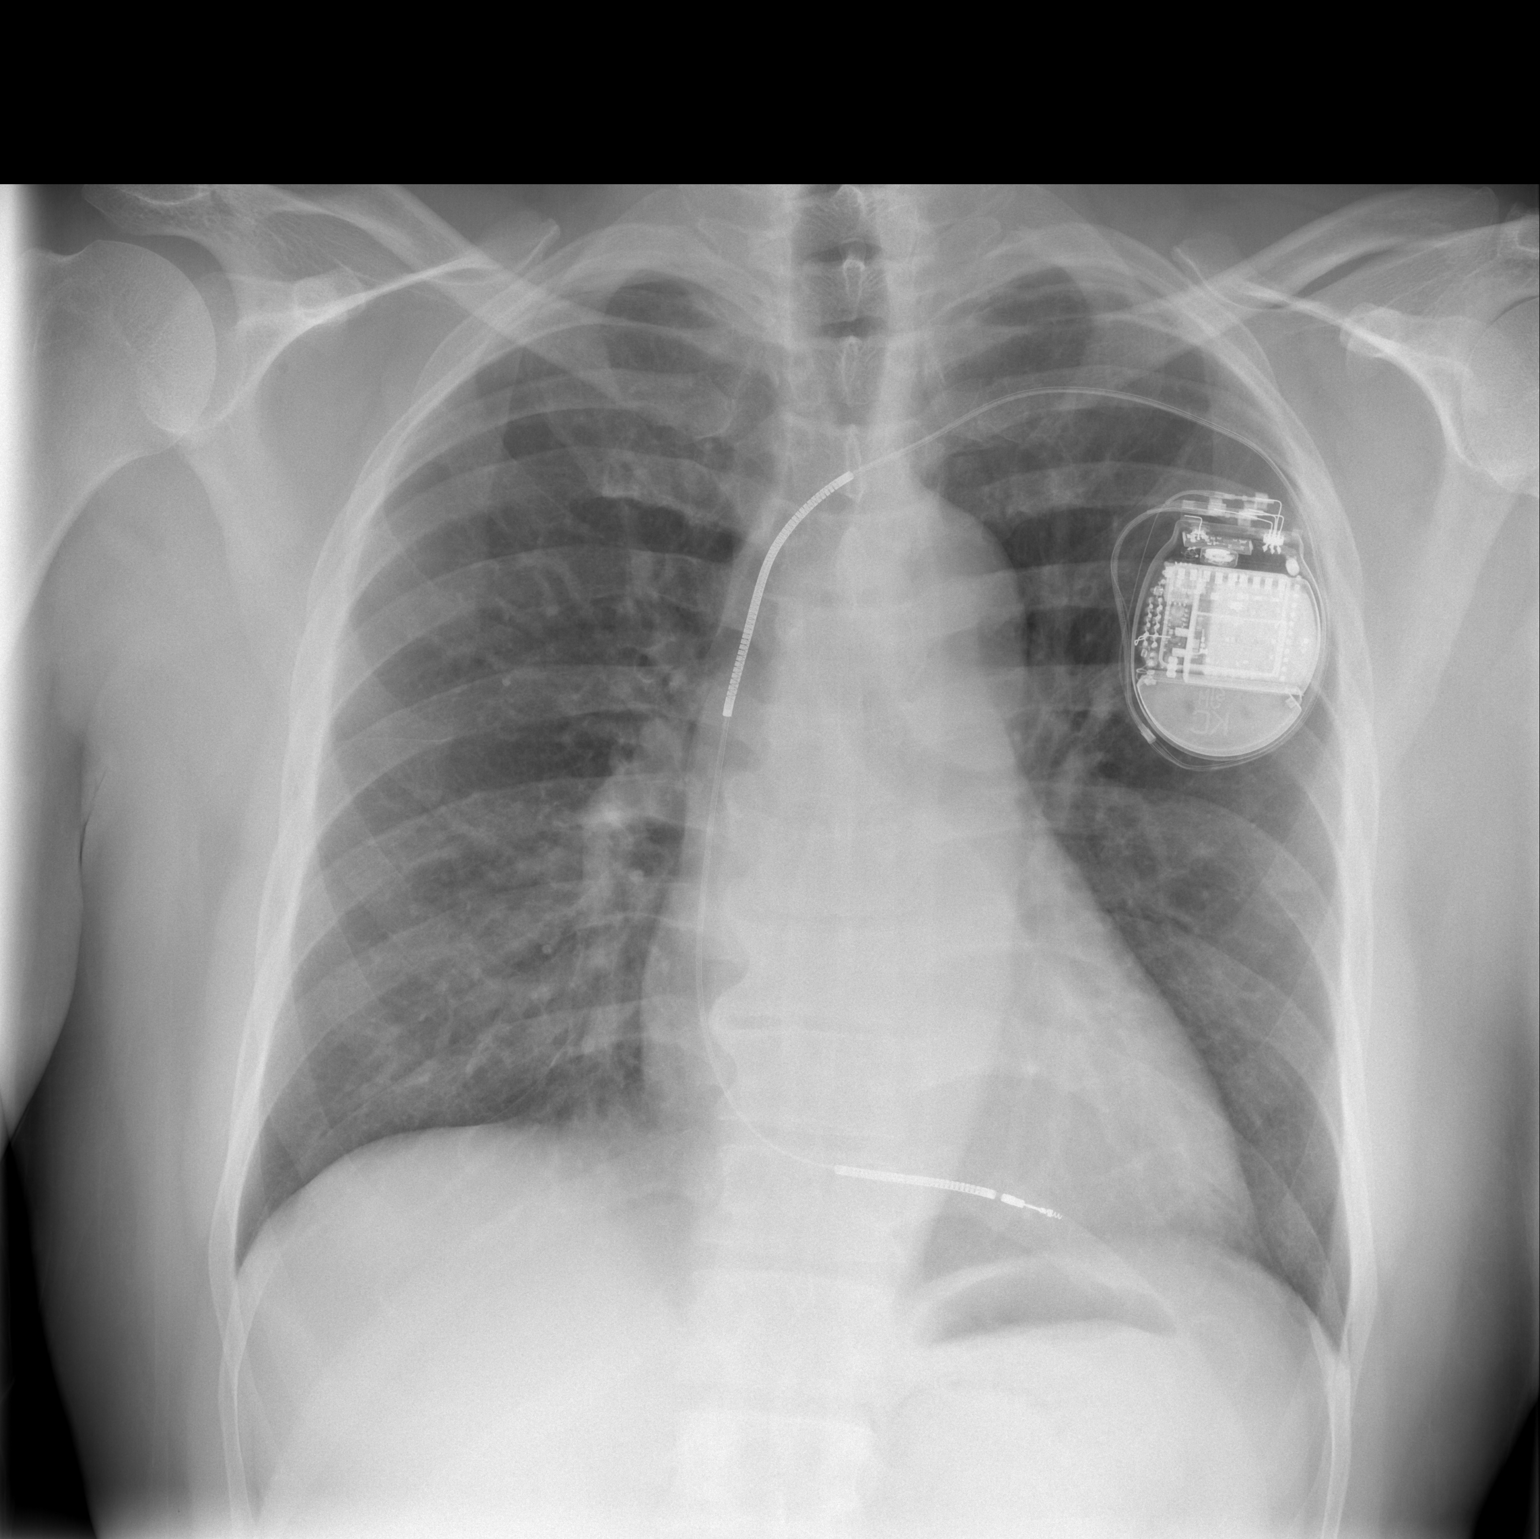

[w chest lat]
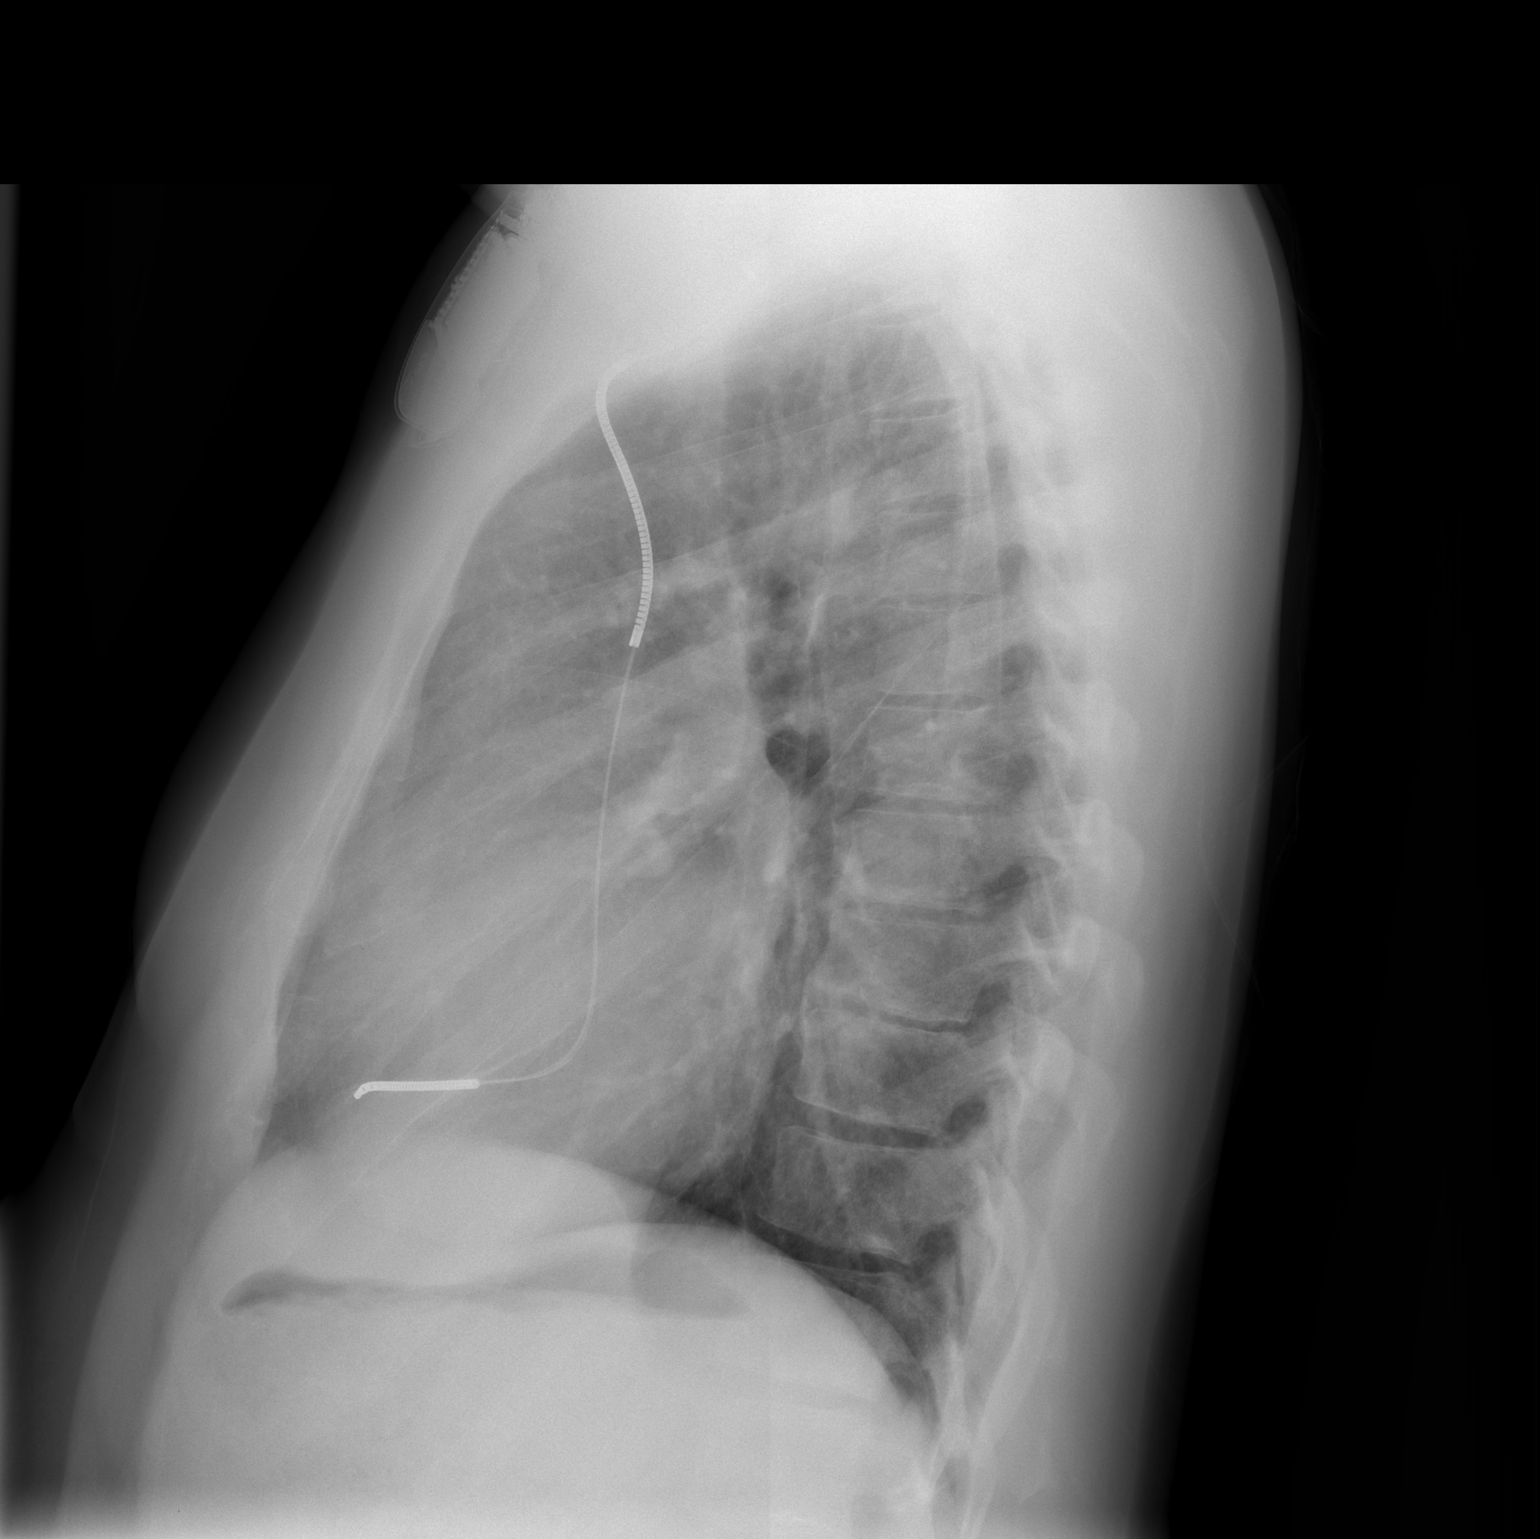

[2 of 2 positions shown; findings below may reference images not displayed]

FINDINGS: Pacer is in satisfactory position.  Mild cardiac
enlargement.  The lungs are clear.  Degenerative changes mid
thoracic spine.
IMPRESSION: Cardiac enlargement.  No acute pulmonary process.

## 2009-01-16 IMAGING — CR DG CHEST 2V
2 series · 2 of 2 positions shown · non-contrast
Comparison: Chest x-ray of 06/30/2007

CLINICAL DATA: Short of breath, hypertension, chest pain

CHEST - 2 VIEW

[w chest pa]
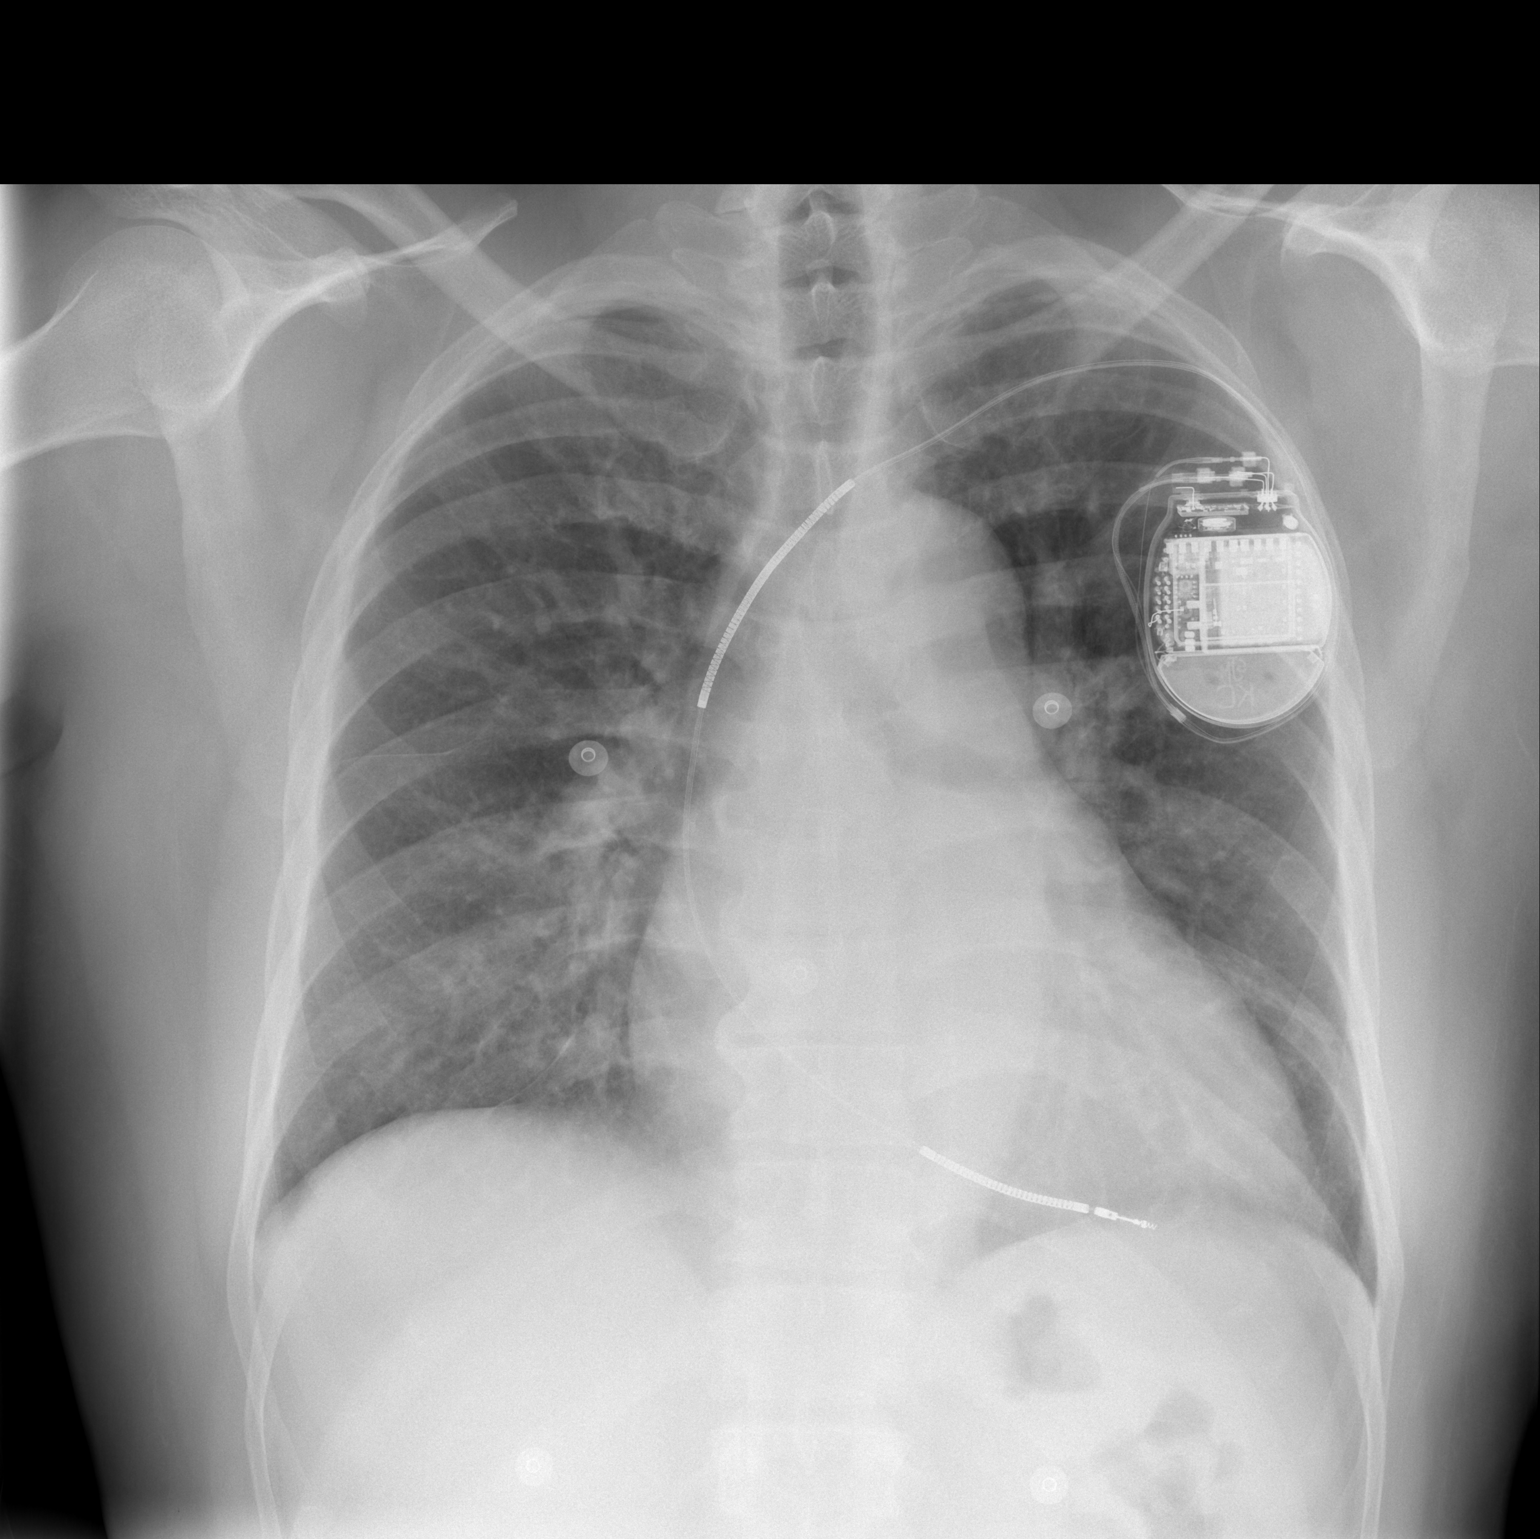

[w chest lat]
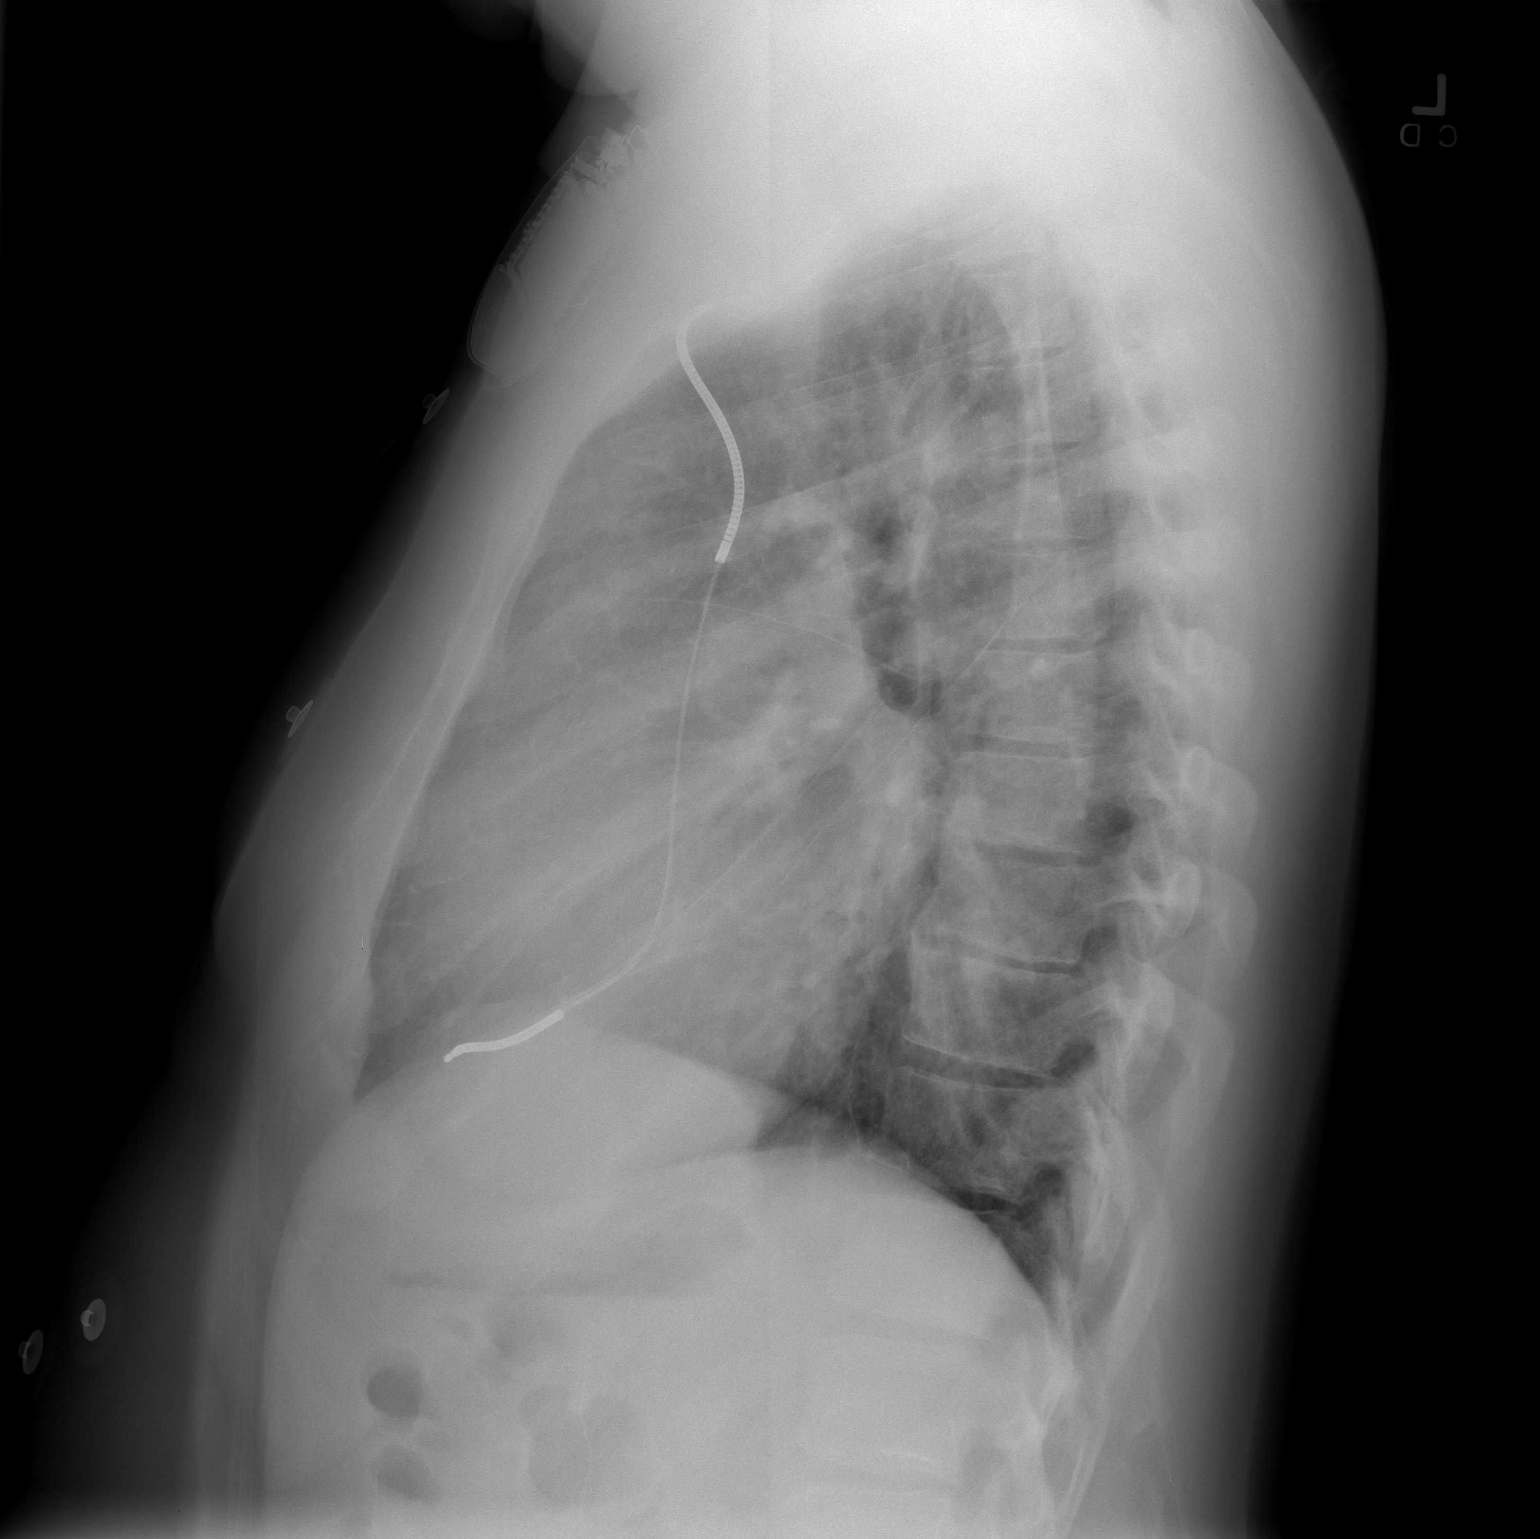

[2 of 2 positions shown; findings below may reference images not displayed]

FINDINGS: Cardiomegaly is stable.  There may be mild pulmonary
vascular congestion present.  Pacer with AICD lead remains.
IMPRESSION: Stable cardiomegaly.  Question of mild pulmonary vascular
congestion.

## 2009-01-16 IMAGING — US US ABDOMEN COMPLETE
1 series · 13 of 25 positions shown · non-contrast
Comparison: None

CLINICAL DATA: Right upper quadrant pain, nausea, sarcoidosis

ABDOMEN ULTRASOUND
TECHNIQUE: Complete abdominal ultrasound examination was performed
including evaluation of the liver, gallbladder, bile ducts,
pancreas, kidneys, spleen, IVC, and abdominal aorta.

[Series 1: unknown · 0.33mm/px · 13 of 89 slices shown]
[im 1/89]
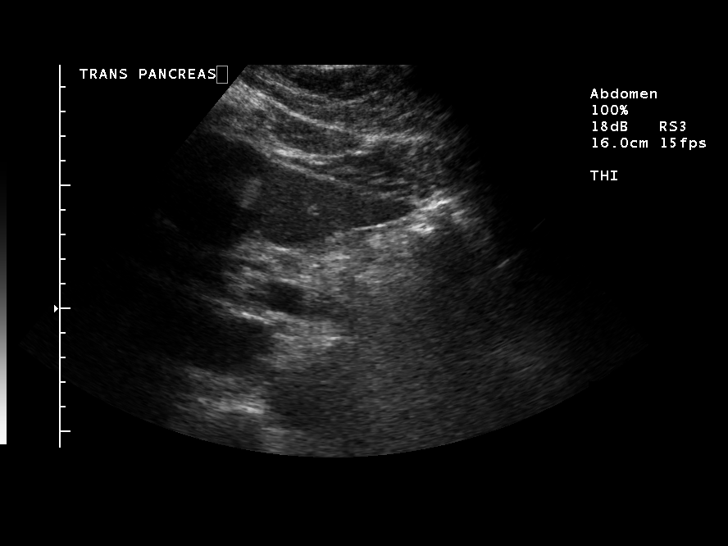
[im 8/89]
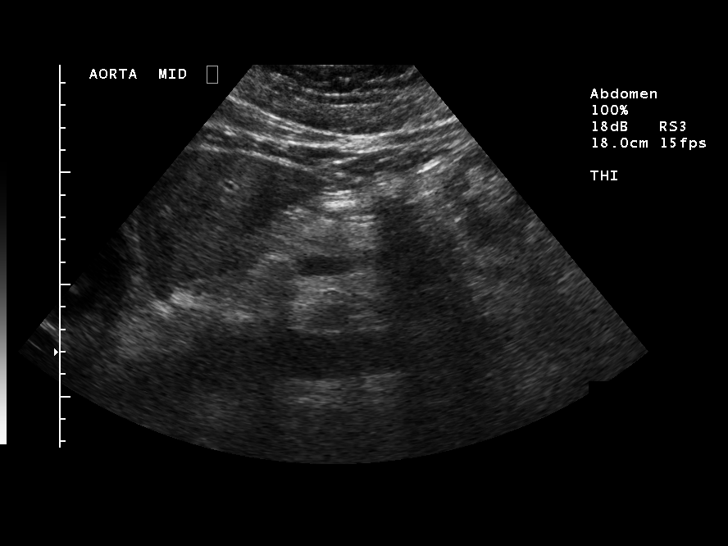
[im 15/89]
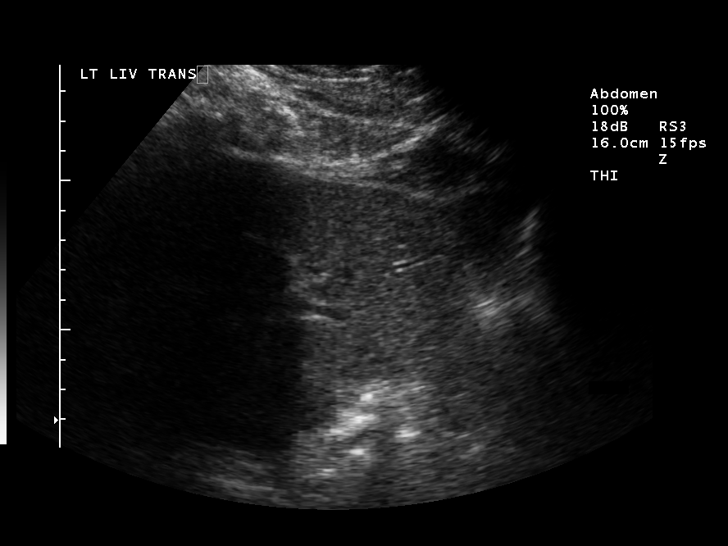
[im 23/89]
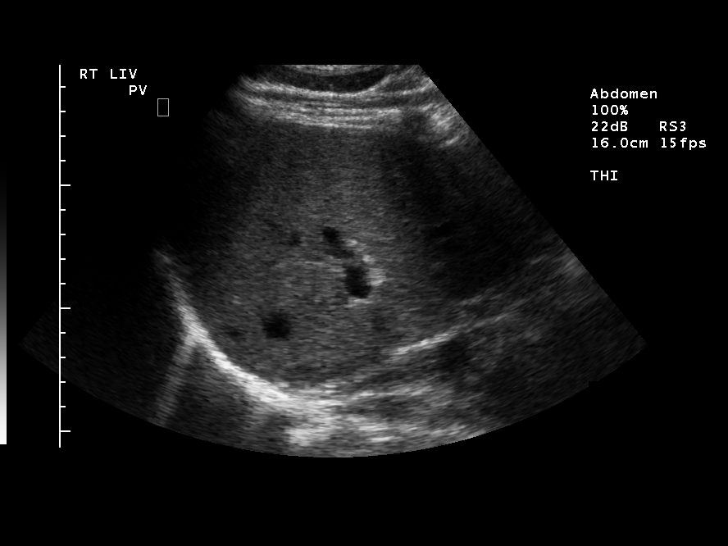
[im 30/89]
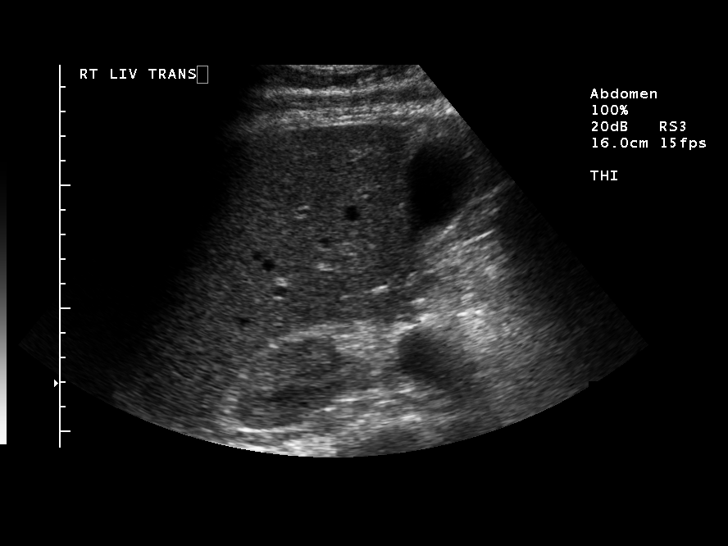
[im 37/89]
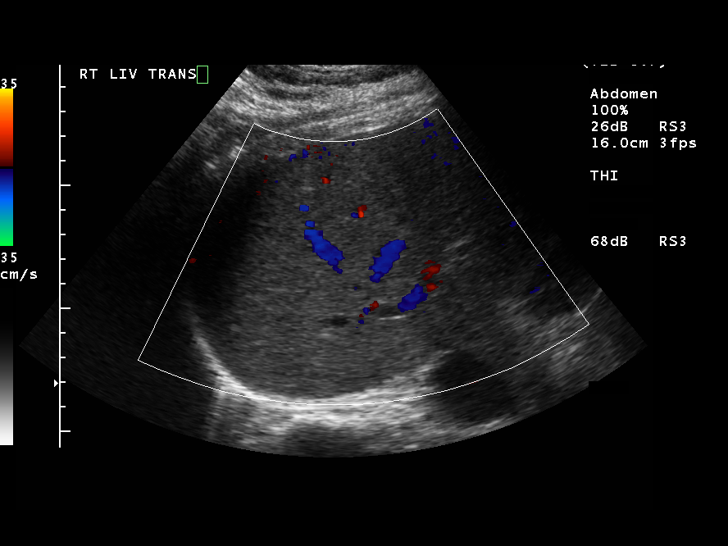
[im 45/89]
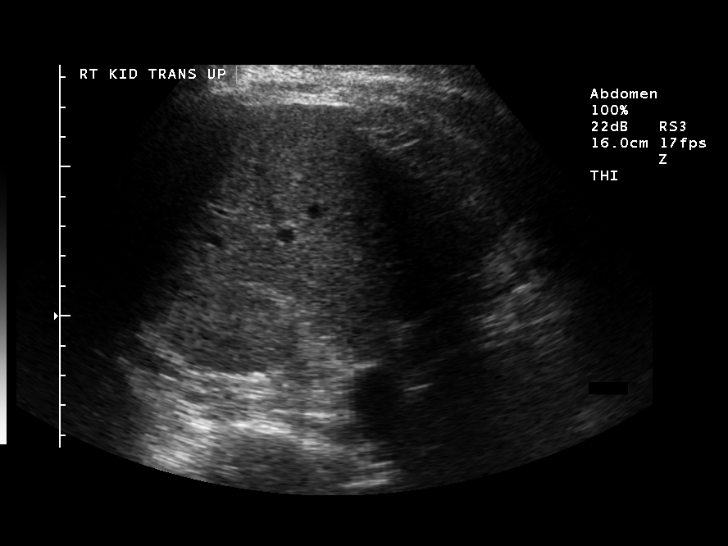
[im 52/89]
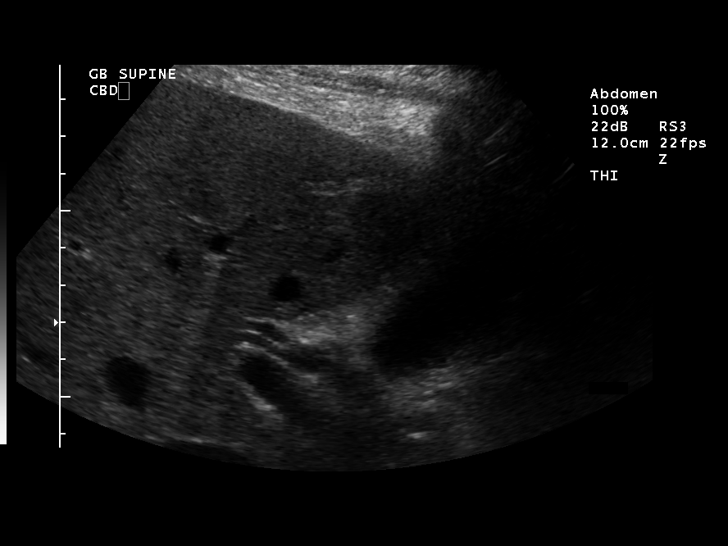
[im 59/89]
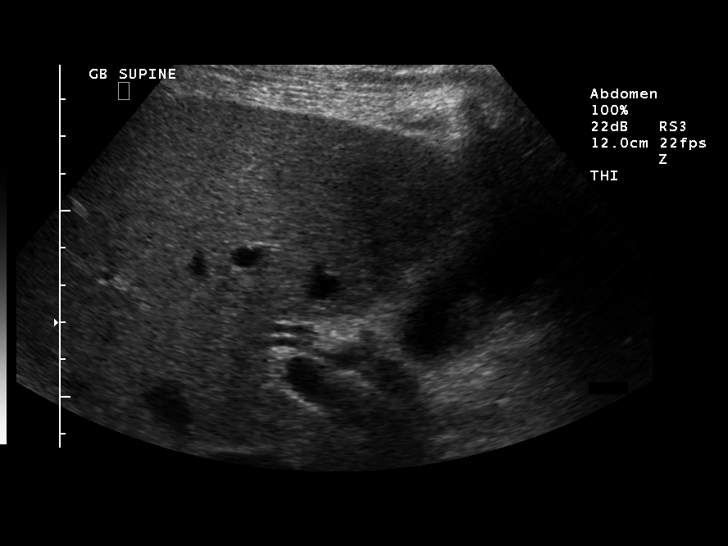
[im 67/89]
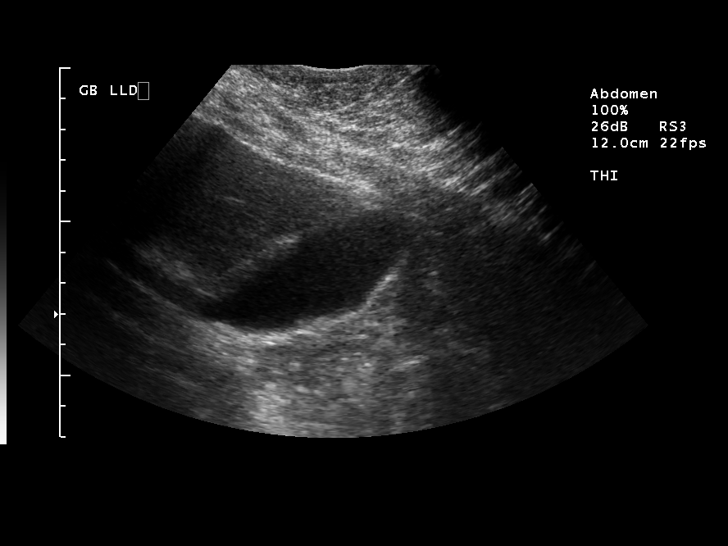
[im 74/89]
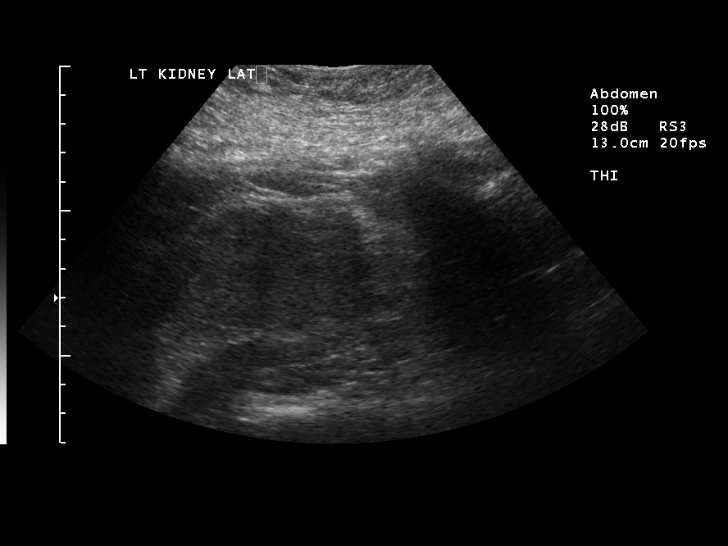
[im 81/89]
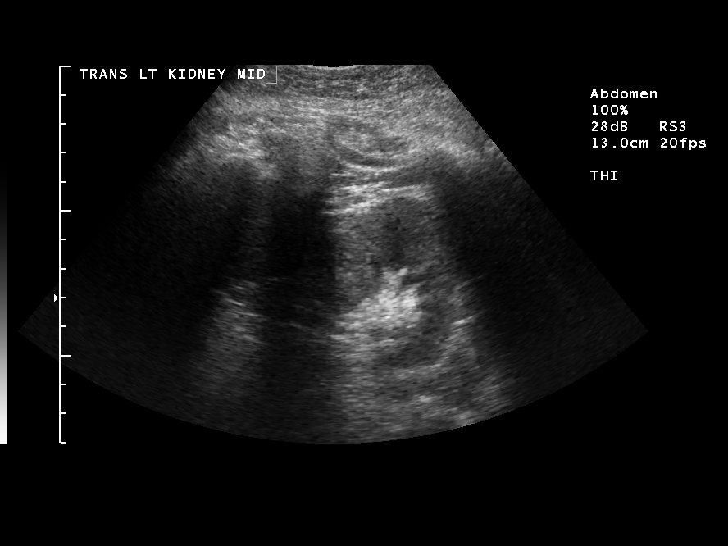
[im 89/89]
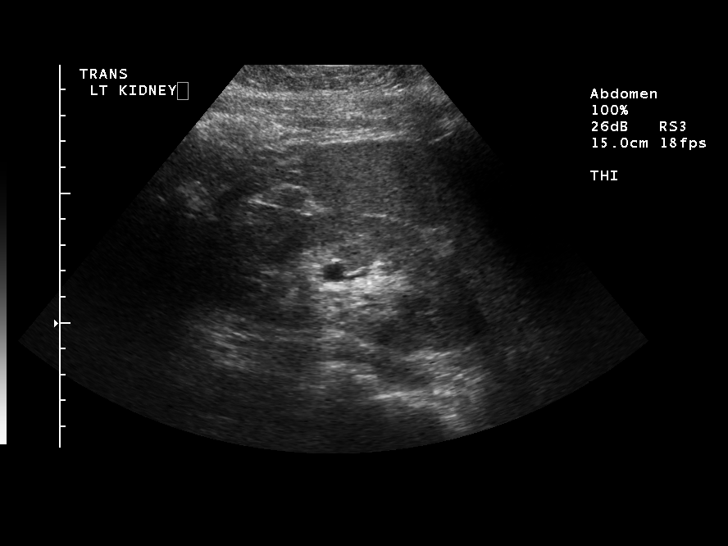

[13 of 25 positions shown; findings below may reference images not displayed]

FINDINGS: The gallbladder wall is somewhat thickened measuring up
to 7 mm.  However no gallstones are seen and no pain is present.
This finding could indicate either early edema of cholecystitis or
be due to hypoproteinemia.  The liver has a normal echogenic
pattern.  The intrahepatic ducts are slightly prominent.  Common
bile duct measures 5.8 mm which is within normal limits. IVC,
pancreas, and spleen appear normal.  No hydronephrosis is seen.
The right kidney measures 10.9 cm sagittally with left kidney
measuring 12.2 cm.  The kidneys appears slightly echogenic and
correlation with renal laboratory values is recommended.  The aorta
is normal in caliber.
IMPRESSION: 1.  Somewhat thickened gallbladder wall but no gallstones or pain.
Consider edema of cholecystitis versus wall thickening secondary to
hypoproteinemia.
2.  Slightly prominent intrahepatic bile ducts.
3.  Slightly echogenic renal parenchyma.  Correlate with renal
laboratory values.

## 2009-03-03 IMAGING — CR DG FOREARM 2V*R*
2 series · 2 of 2 positions shown · non-contrast
Comparison: No priors.

CLINICAL DATA: Injury.  Arm went through a glass table

RIGHT FOREARM - 2 VIEW

[x forearm ap right]
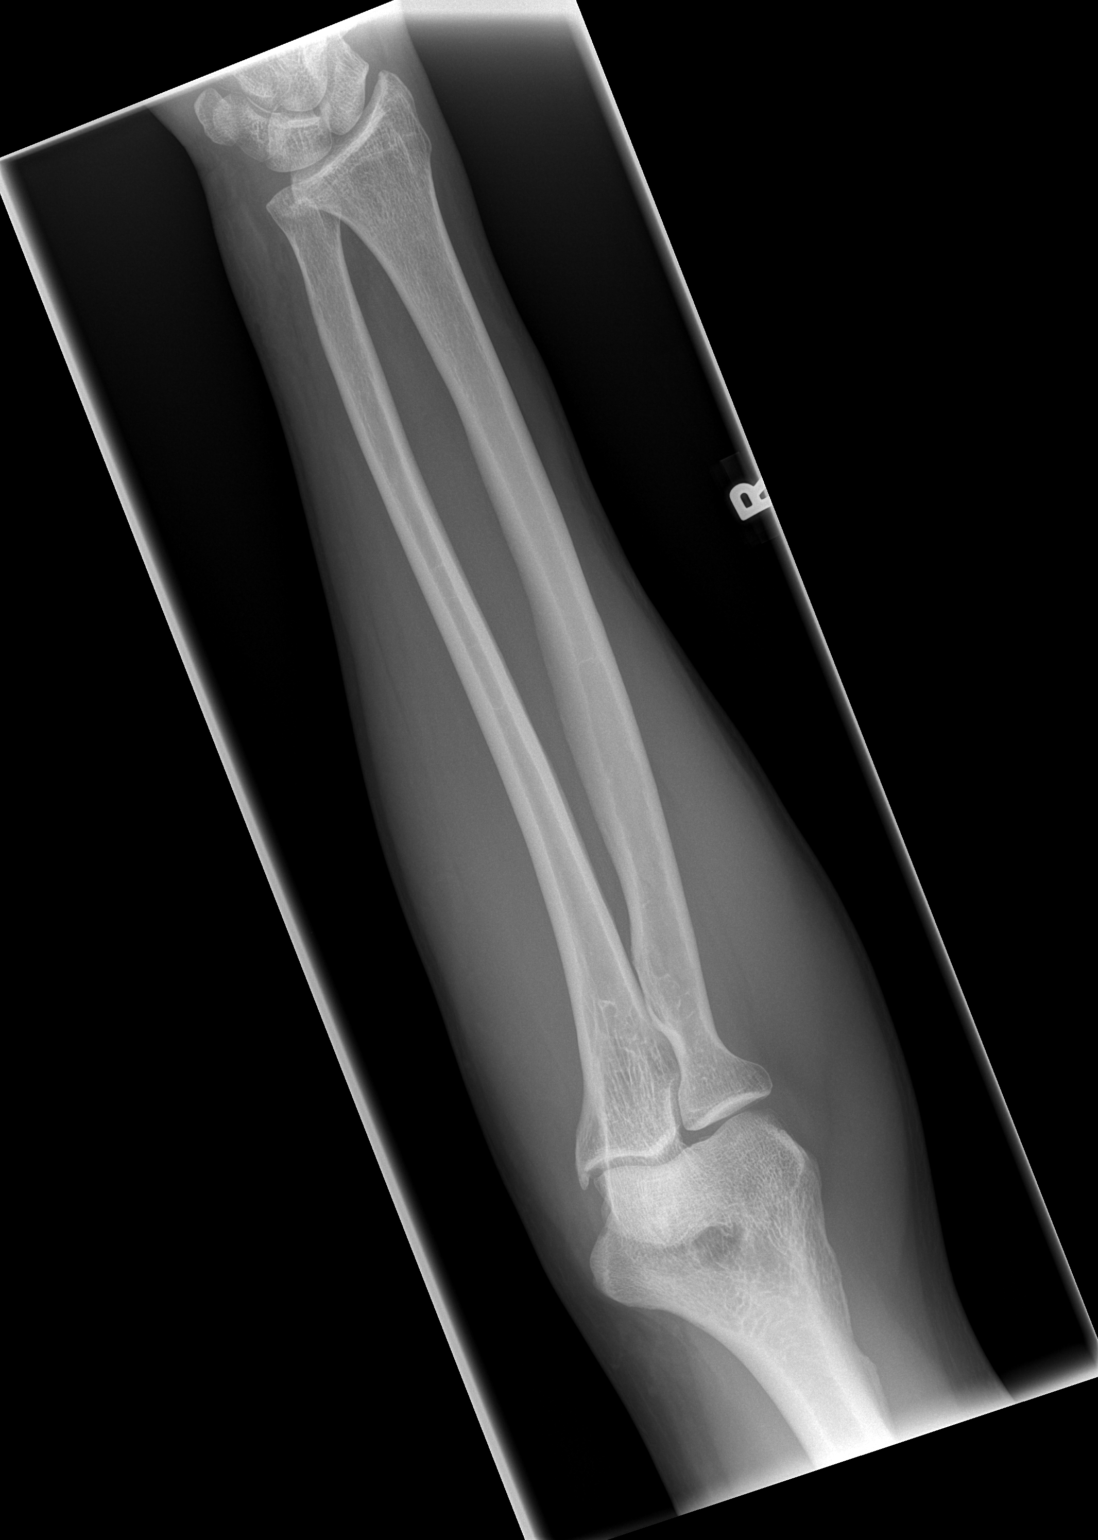

[x forearm lat right]
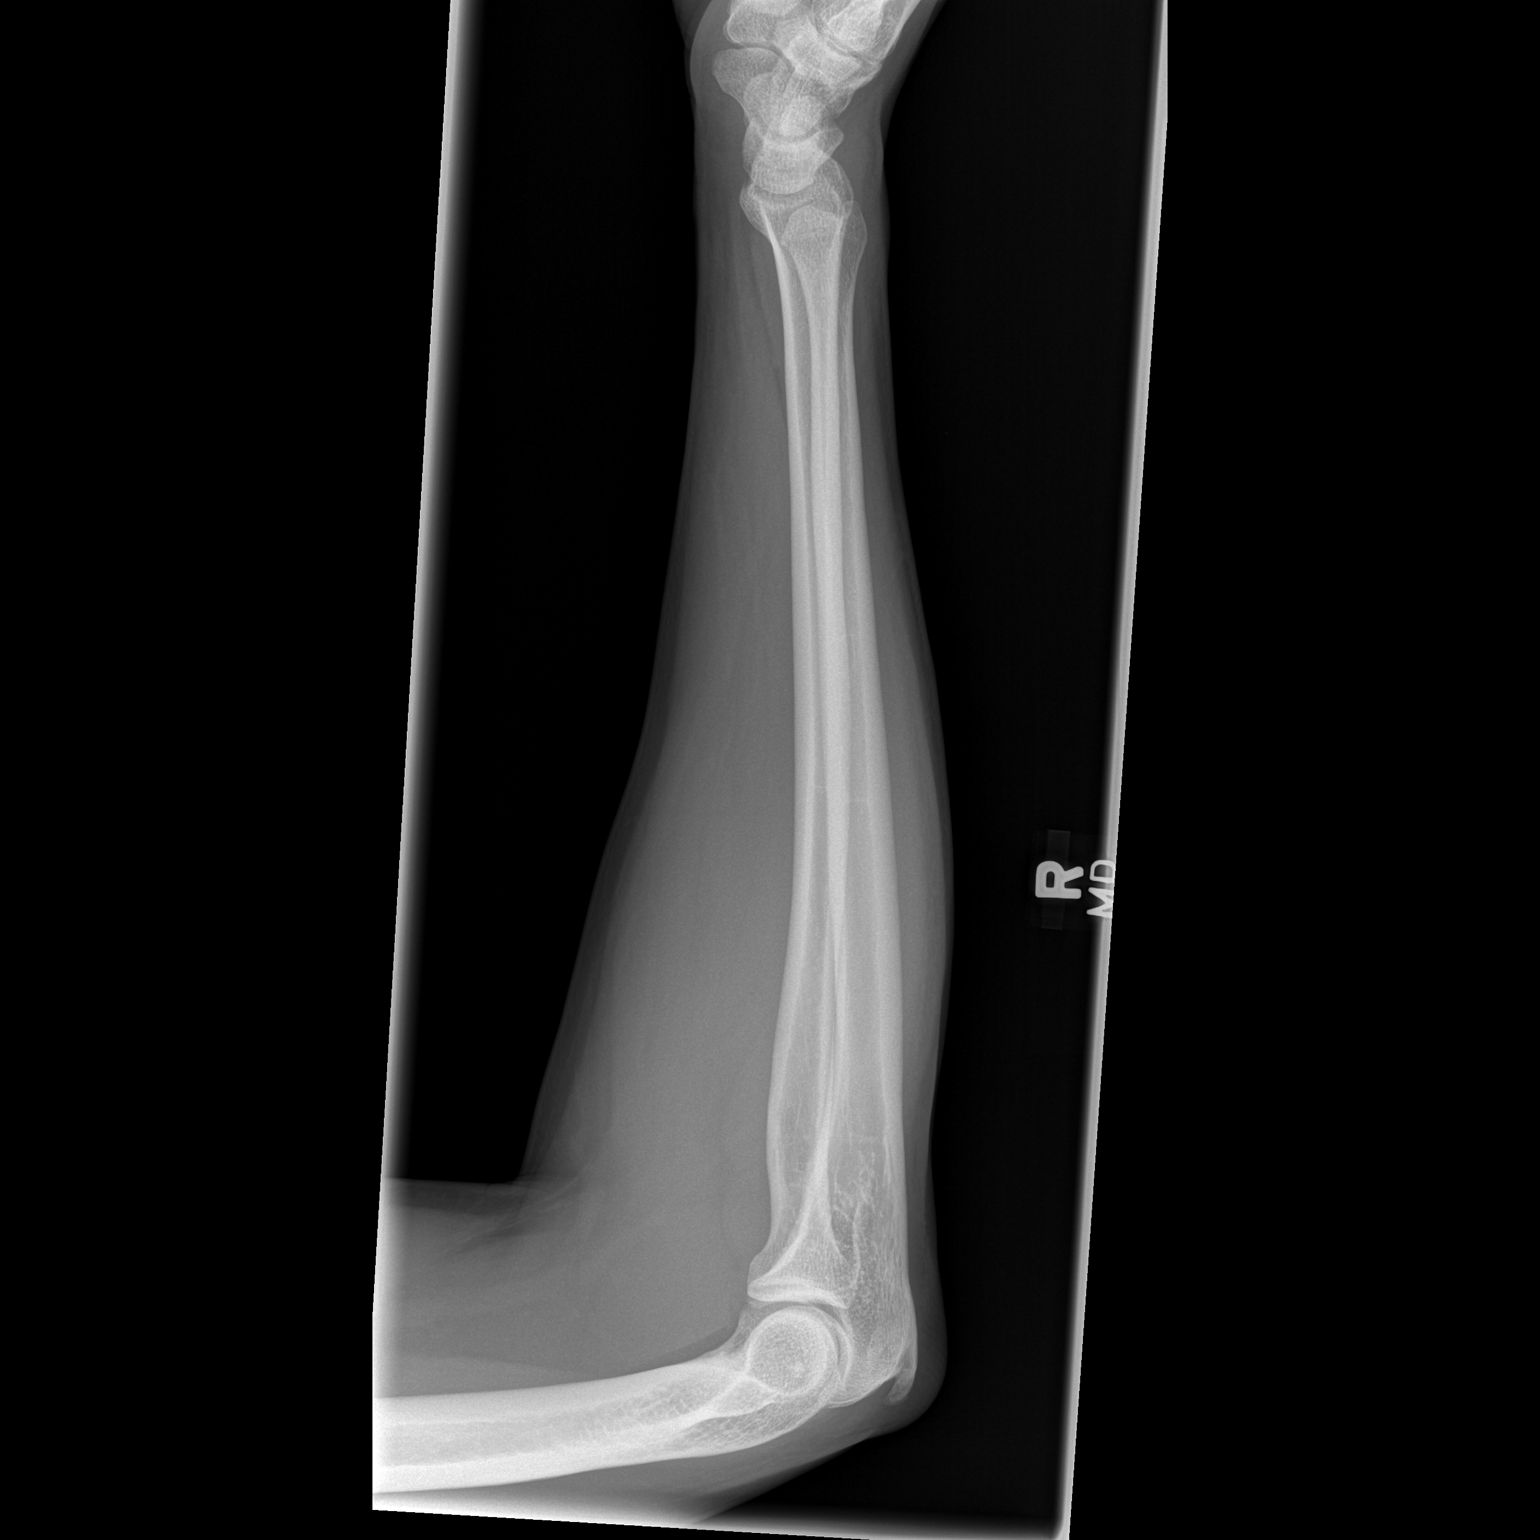

[2 of 2 positions shown; findings below may reference images not displayed]

FINDINGS: No acute osseous abnormality.  No radiopaque foreign
body.
IMPRESSION: No fracture.  No radiopaque foreign body.

## 2009-03-03 IMAGING — CR DG WRIST COMPLETE 3+V*R*
3 series · 3 of 3 positions shown · non-contrast
Comparison: None

CLINICAL DATA: Injury.  Laceration and soft tissue swelling.

RIGHT WRIST - COMPLETE 3+ VIEW

[x wrist pa right (1 of 2)]
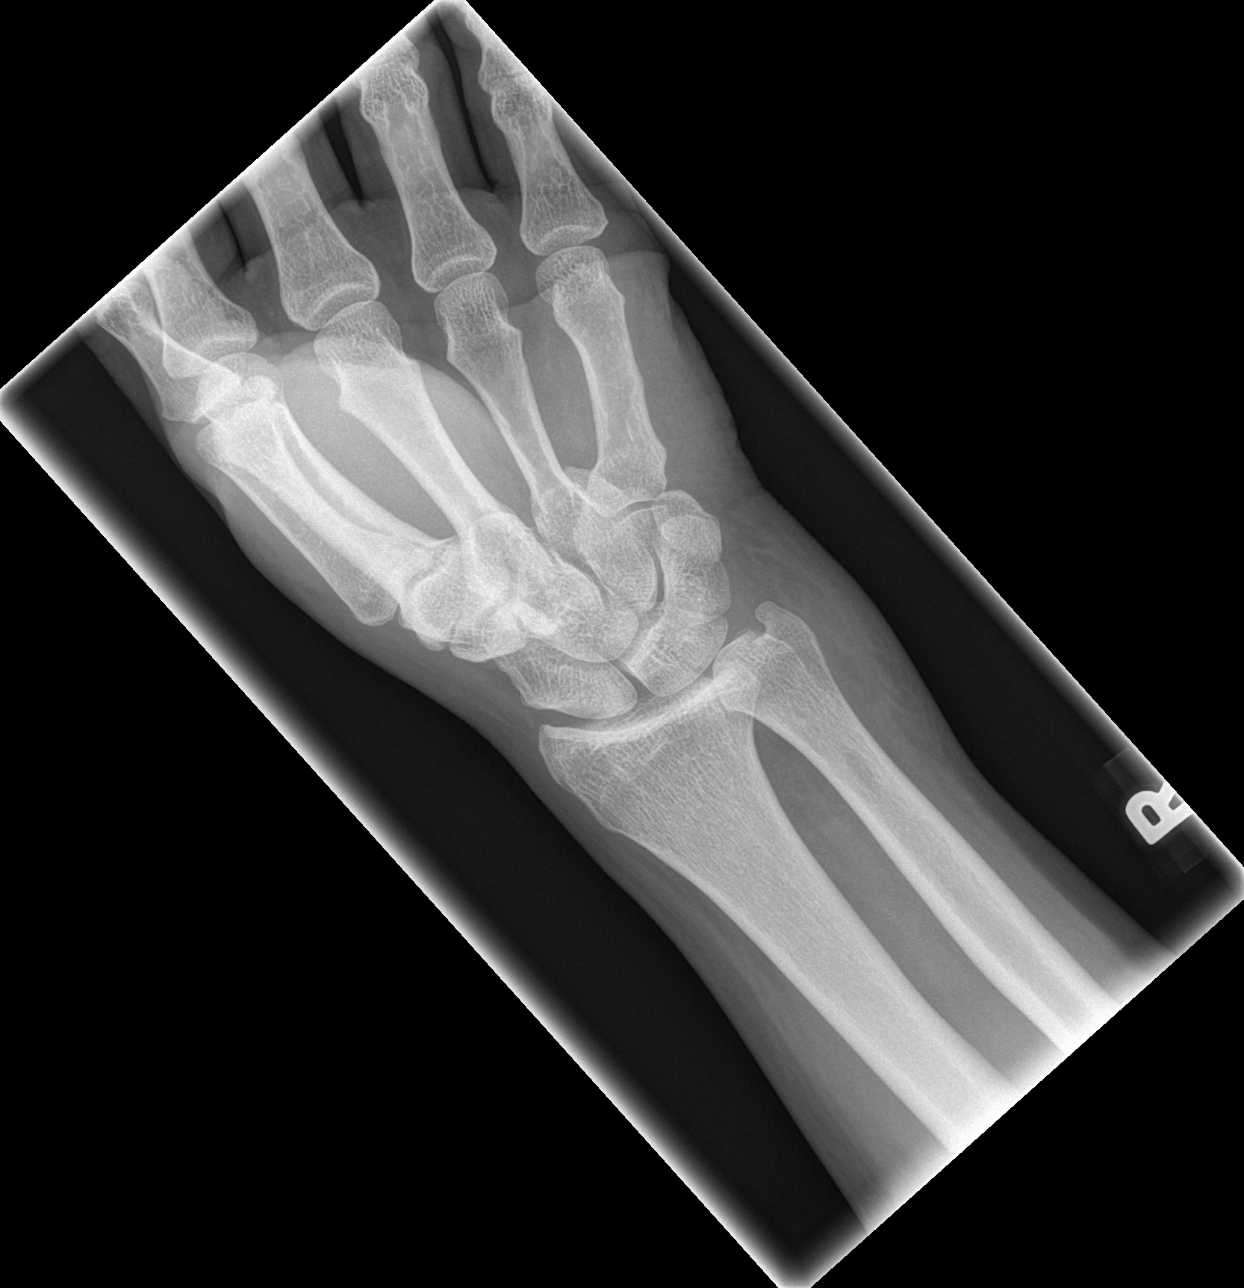

[x wrist pa right (2 of 2)]
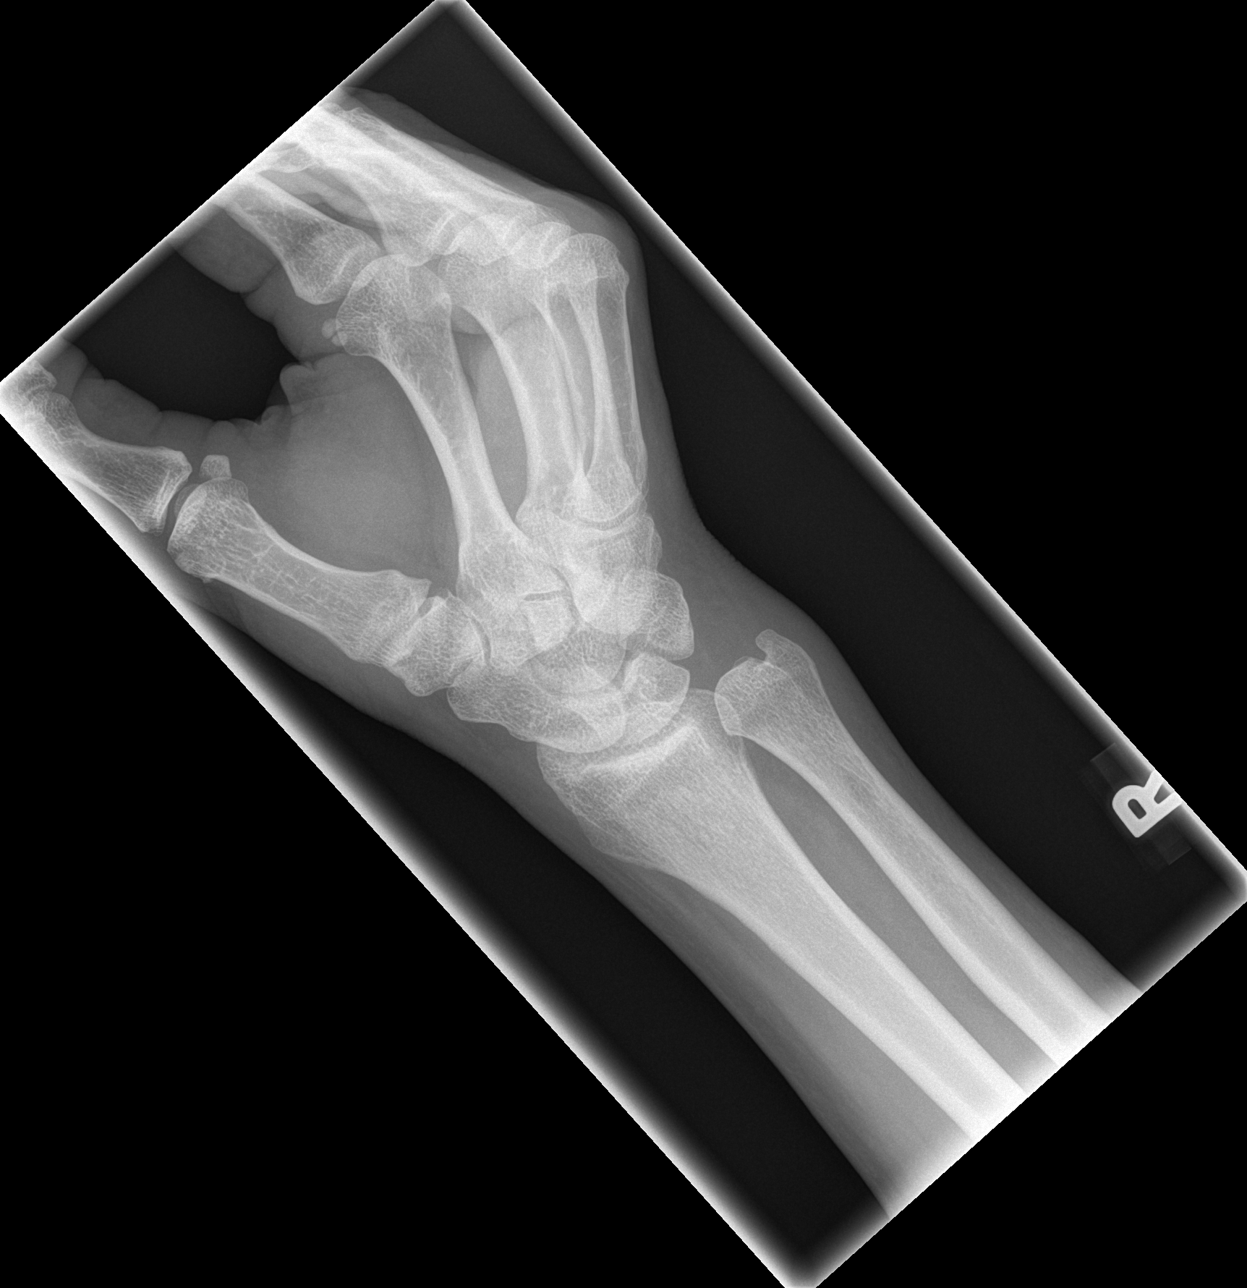

[x wrist lat right]
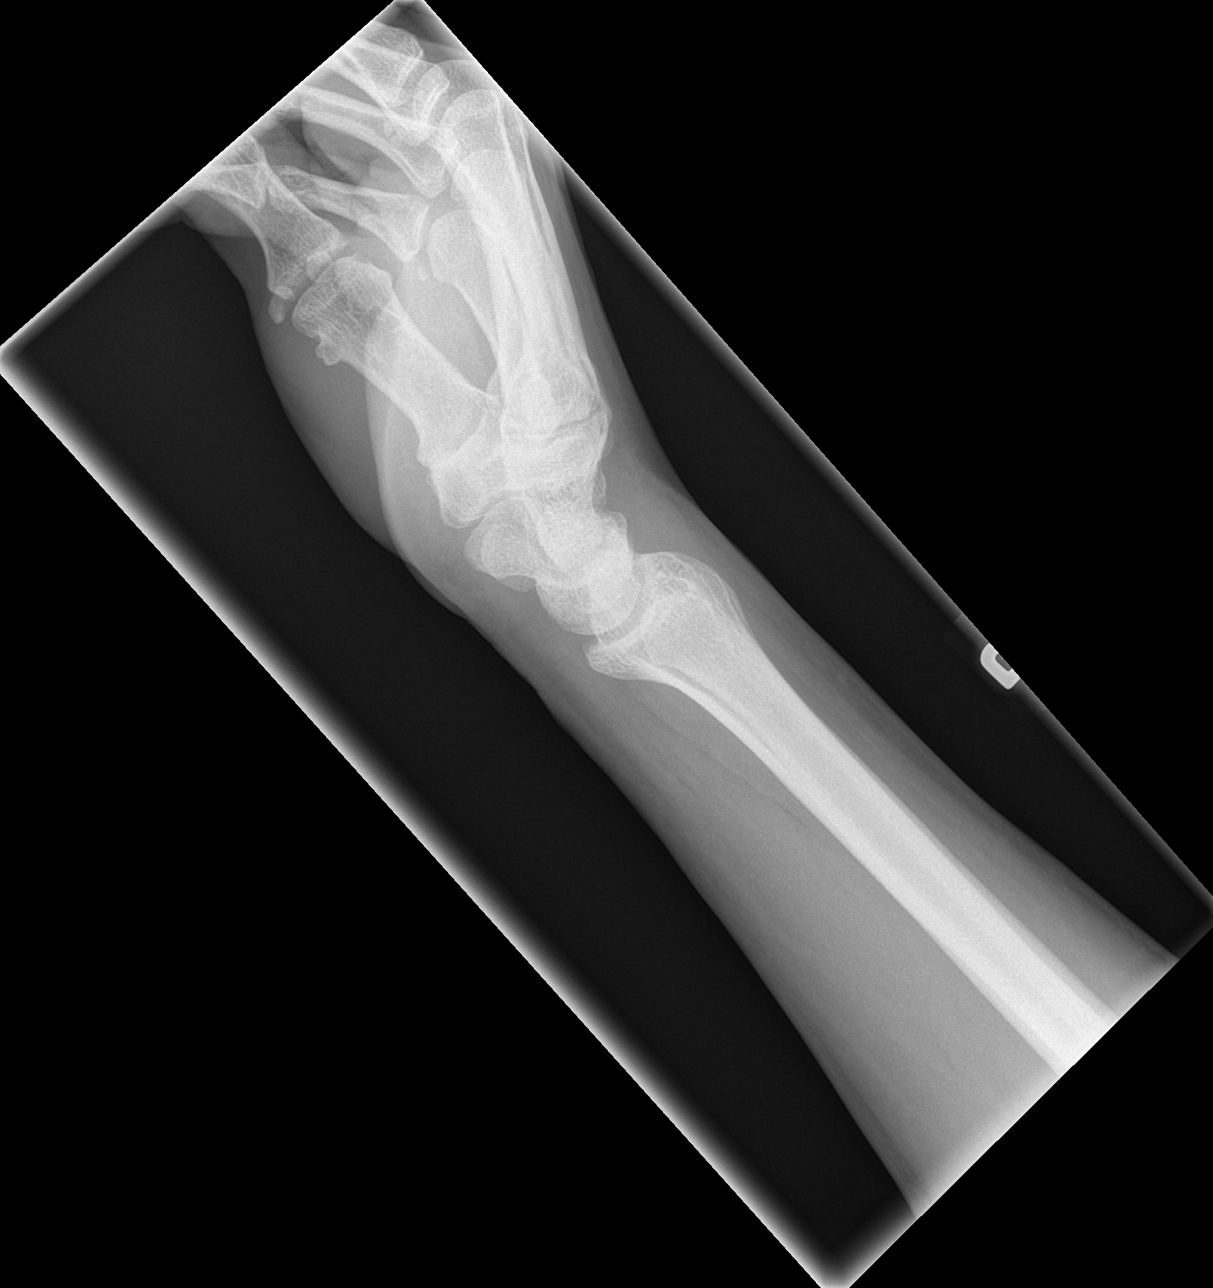

[3 of 3 positions shown; findings below may reference images not displayed]

FINDINGS: No fracture.  No radiopaque foreign body.  Soft tissue
swelling medially.
IMPRESSION: No acute osseous findings.  No radiopaque foreign body.

## 2009-03-03 IMAGING — CR DG HAND COMPLETE 3+V*R*
3 series · 3 of 3 positions shown · non-contrast
Comparison: Radiographs of forearm and wrist

CLINICAL DATA: Injury

RIGHT HAND - COMPLETE 3+ VIEW

[x hand ap right]
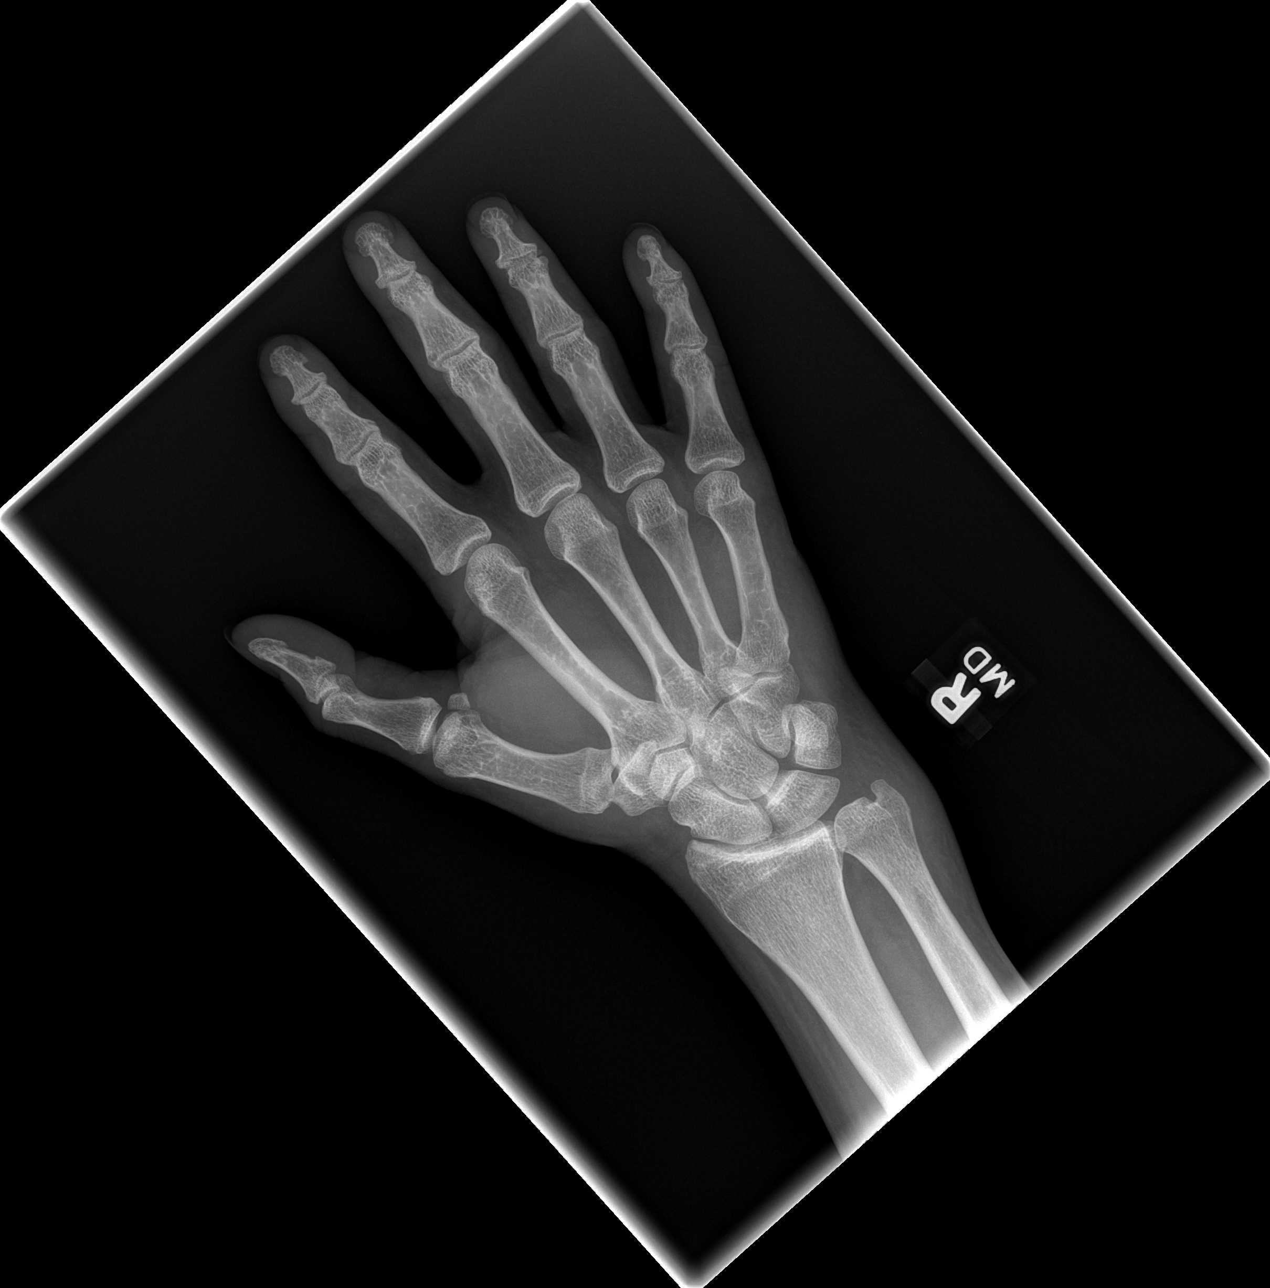

[x hand oblique right]
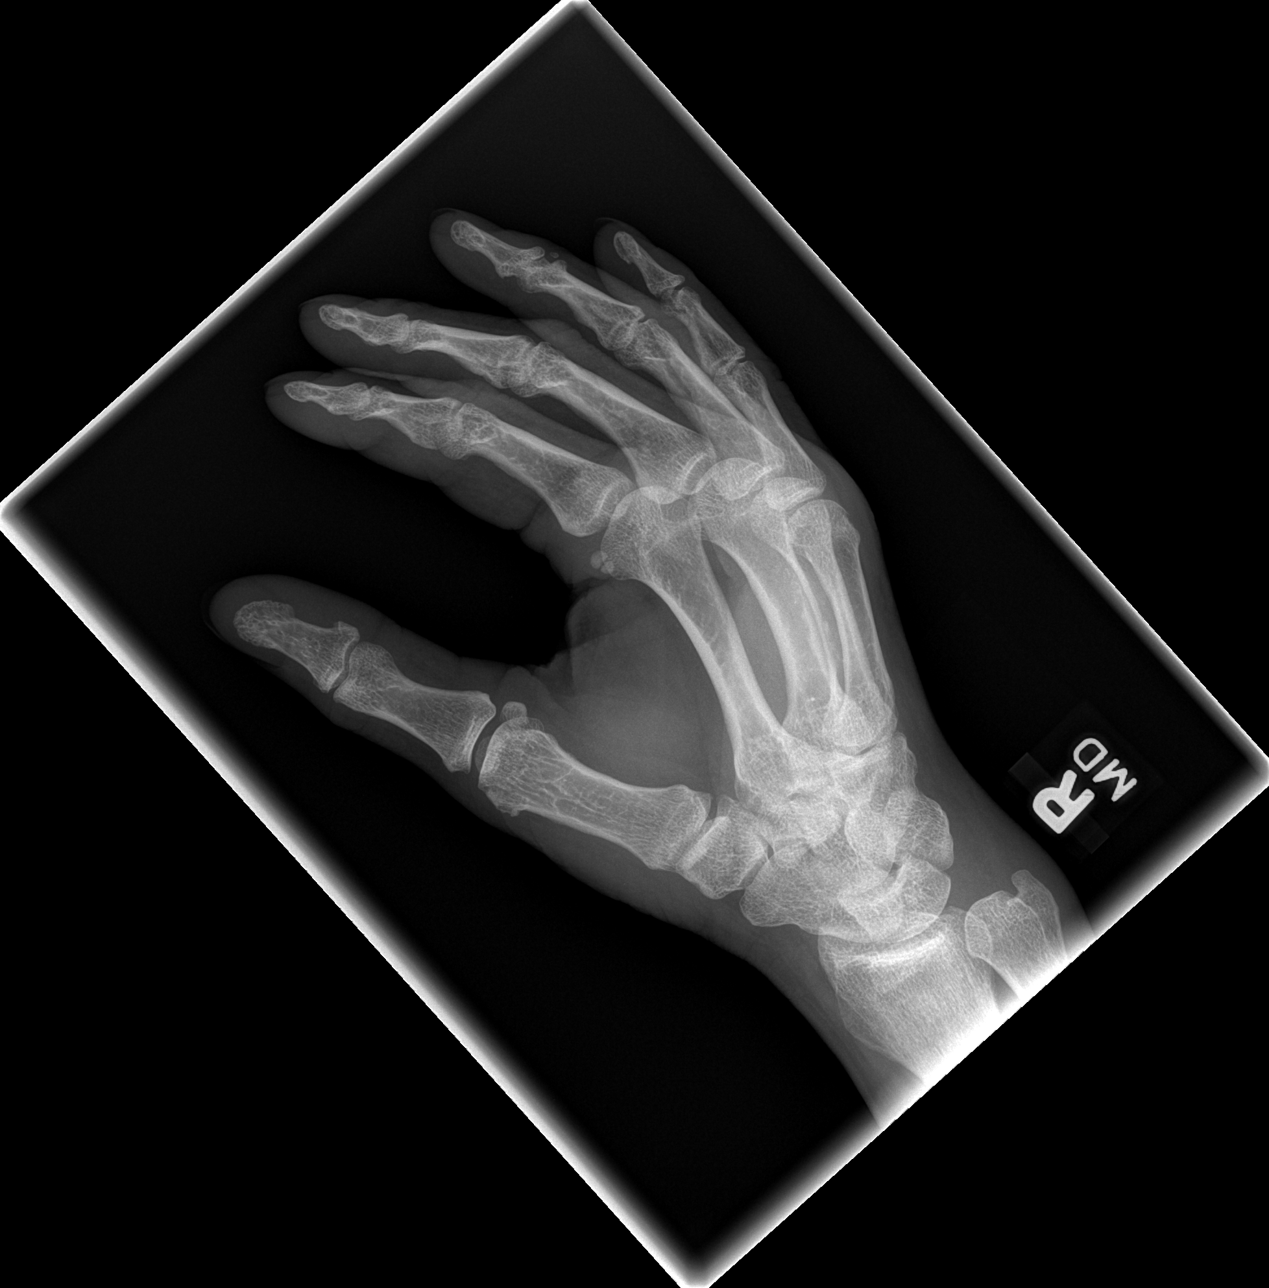

[x hand lat right]
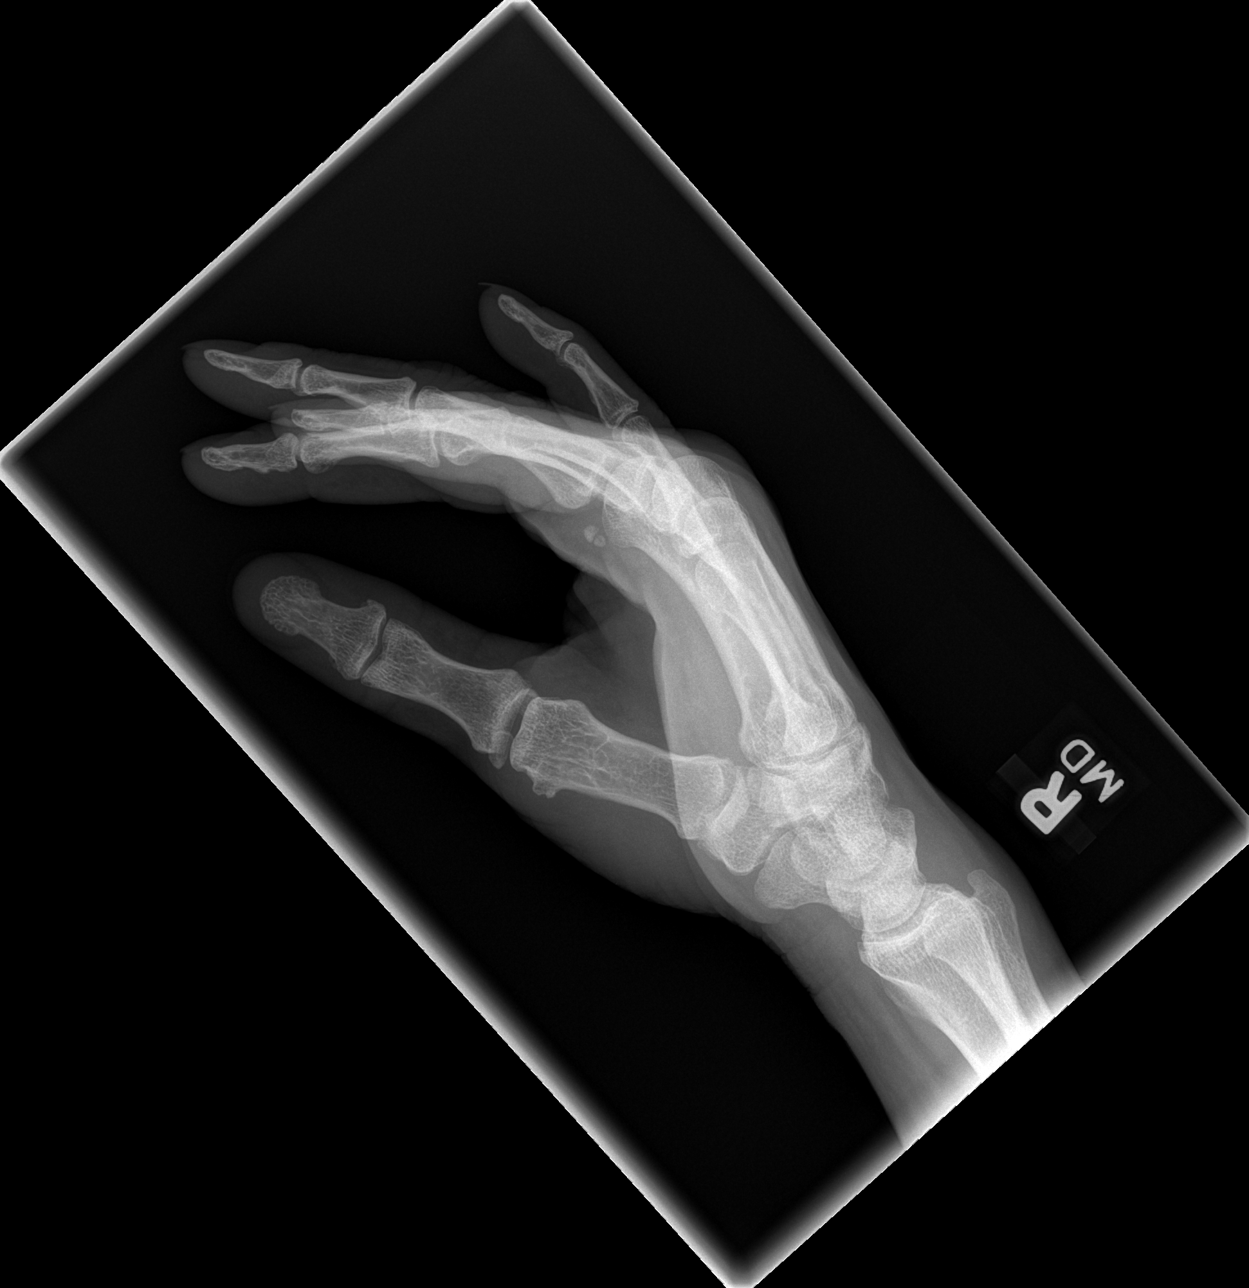

[3 of 3 positions shown; findings below may reference images not displayed]

FINDINGS: No fracture.  No radiopaque foreign body.
IMPRESSION: No acute bony abnormality.  No radiopaque foreign body.

## 2009-03-07 ENCOUNTER — Inpatient Hospital Stay (HOSPITAL_COMMUNITY): Admission: EM | Admit: 2009-03-07 | Discharge: 2009-03-11 | Payer: Self-pay | Admitting: Emergency Medicine

## 2009-04-27 ENCOUNTER — Ambulatory Visit: Payer: Self-pay | Admitting: Internal Medicine

## 2009-04-27 DIAGNOSIS — I10 Essential (primary) hypertension: Secondary | ICD-10-CM | POA: Insufficient documentation

## 2009-04-27 DIAGNOSIS — Z95810 Presence of automatic (implantable) cardiac defibrillator: Secondary | ICD-10-CM | POA: Insufficient documentation

## 2009-04-27 DIAGNOSIS — I472 Ventricular tachycardia, unspecified: Secondary | ICD-10-CM | POA: Insufficient documentation

## 2009-04-27 DIAGNOSIS — K219 Gastro-esophageal reflux disease without esophagitis: Secondary | ICD-10-CM

## 2009-04-27 DIAGNOSIS — E7800 Pure hypercholesterolemia, unspecified: Secondary | ICD-10-CM

## 2009-04-27 DIAGNOSIS — I5020 Unspecified systolic (congestive) heart failure: Secondary | ICD-10-CM | POA: Insufficient documentation

## 2009-04-27 DIAGNOSIS — I428 Other cardiomyopathies: Secondary | ICD-10-CM | POA: Insufficient documentation

## 2009-04-27 DIAGNOSIS — N182 Chronic kidney disease, stage 2 (mild): Secondary | ICD-10-CM

## 2009-04-29 ENCOUNTER — Telehealth: Payer: Self-pay | Admitting: Internal Medicine

## 2009-05-18 ENCOUNTER — Inpatient Hospital Stay (HOSPITAL_COMMUNITY): Admission: EM | Admit: 2009-05-18 | Discharge: 2009-05-25 | Payer: Self-pay | Admitting: Emergency Medicine

## 2009-05-19 ENCOUNTER — Encounter (INDEPENDENT_AMBULATORY_CARE_PROVIDER_SITE_OTHER): Payer: Self-pay | Admitting: Cardiology

## 2009-06-16 ENCOUNTER — Inpatient Hospital Stay (HOSPITAL_COMMUNITY): Admission: EM | Admit: 2009-06-16 | Discharge: 2009-06-24 | Payer: Self-pay | Admitting: Emergency Medicine

## 2009-07-03 ENCOUNTER — Emergency Department (HOSPITAL_COMMUNITY): Admission: EM | Admit: 2009-07-03 | Discharge: 2009-07-04 | Payer: Self-pay | Admitting: Emergency Medicine

## 2009-07-10 IMAGING — CR DG CHEST 2V
2 series · 2 of 2 positions shown · non-contrast
Comparison: 08/05/2007.

CLINICAL DATA: Chest pain.

CHEST - 2 VIEW

[w chest pa]
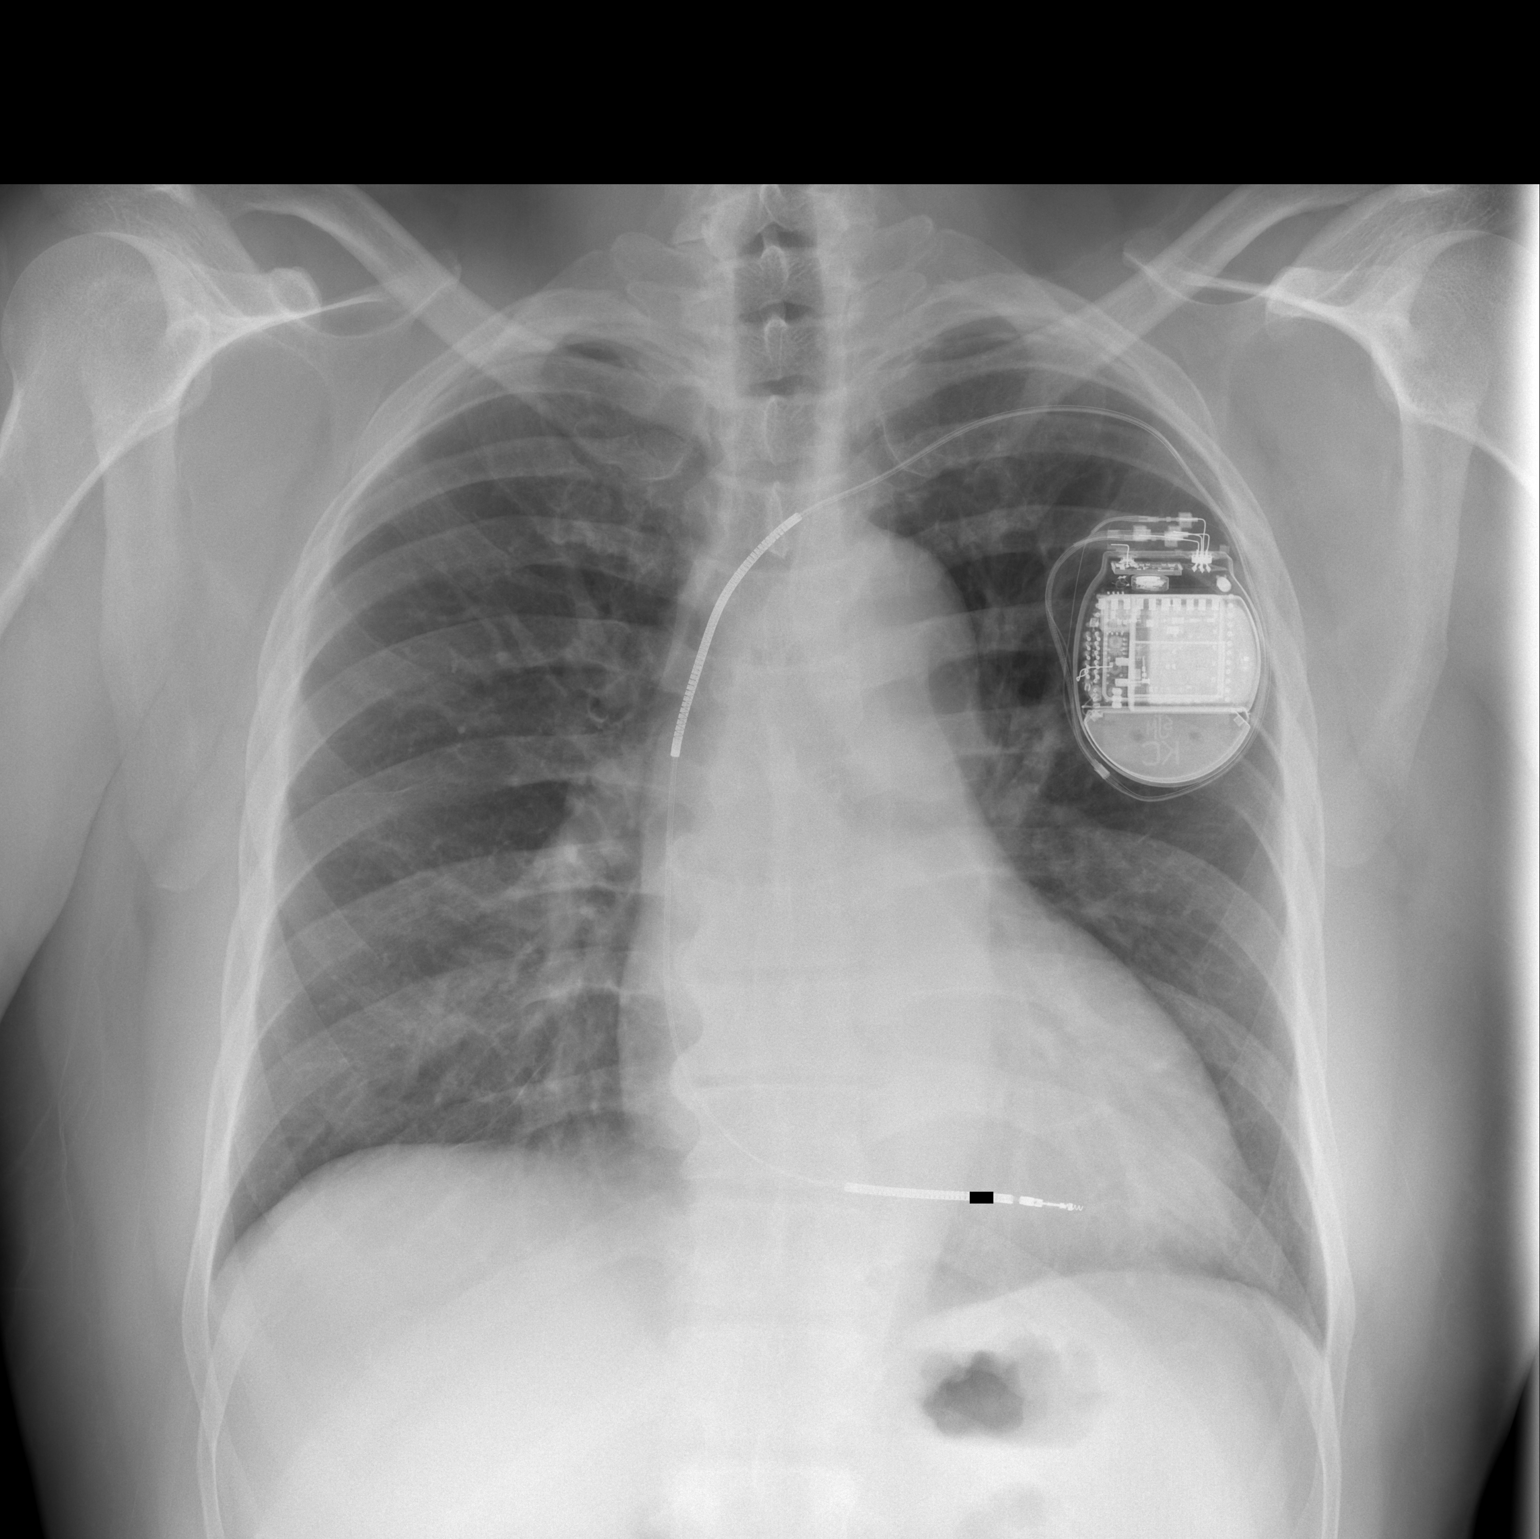

[w chest lat]
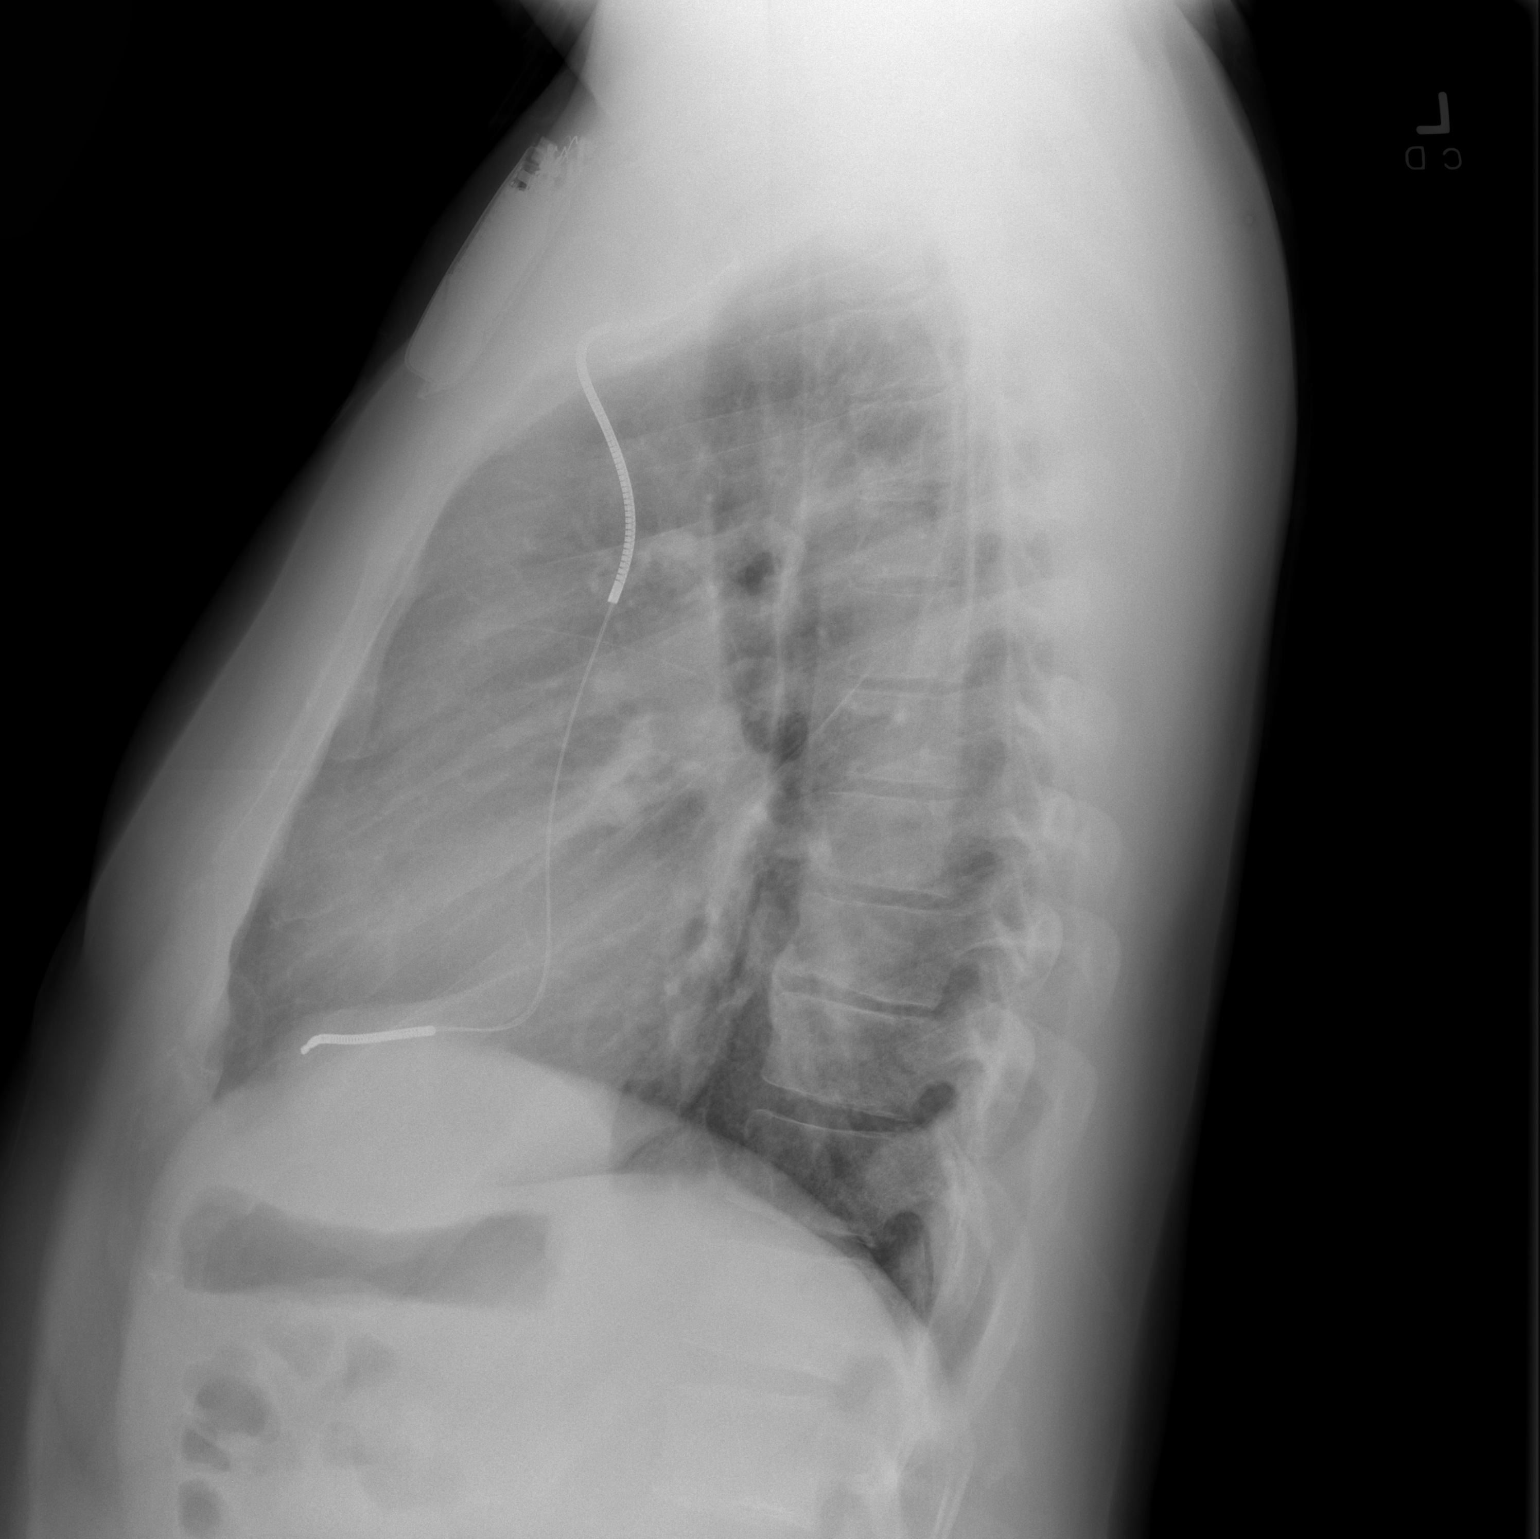

[2 of 2 positions shown; findings below may reference images not displayed]

FINDINGS: The heart is mildly enlarged.  AICD is in good position
with the tip in the right ventricle.  There is no heart failure.
The lungs are clear without infiltrate or effusion.
IMPRESSION: No active cardiopulmonary disease.

## 2009-07-13 IMAGING — CR DG CHEST 1V PORT
1 series · 1 of 1 positions shown · non-contrast
Comparison: [DATE]

CLINICAL DATA: Chest pain.  Cardiomyopathy.

PORTABLE CHEST - 1 VIEW

[view not recorded]
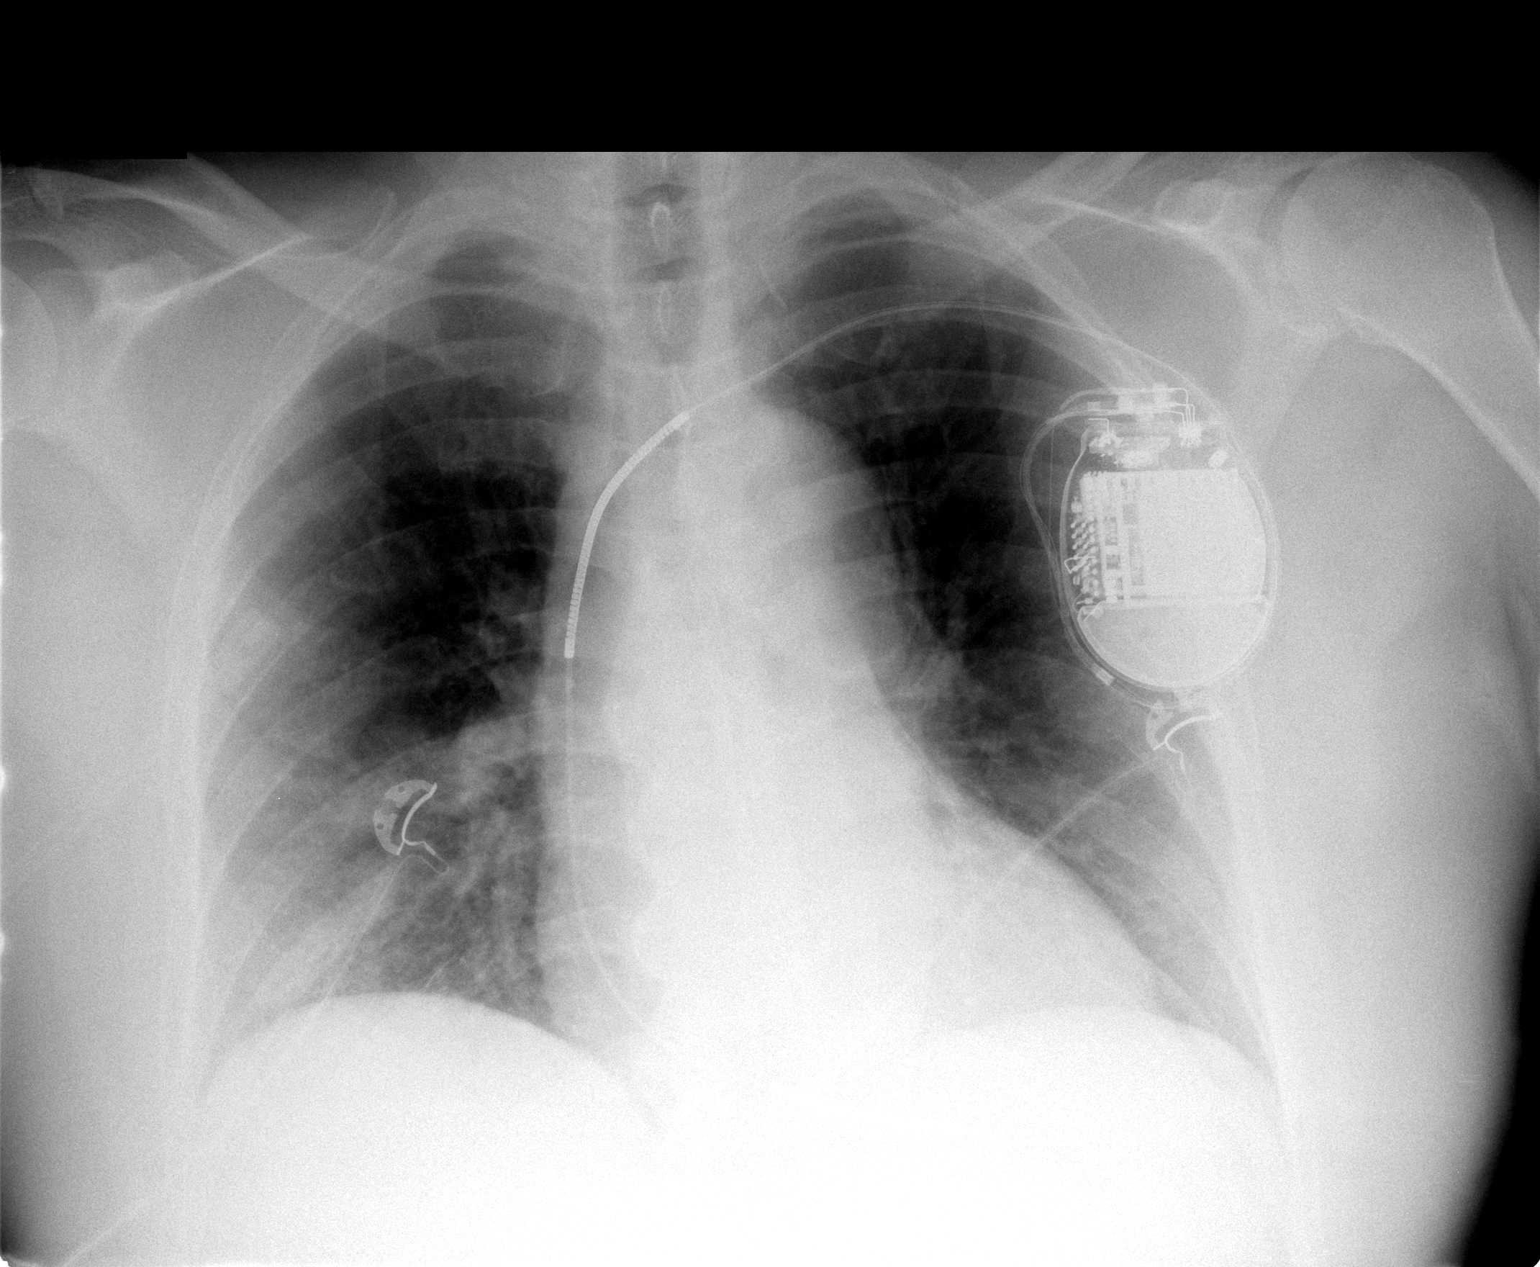

[1 of 1 positions shown; findings below may reference images not displayed]

FINDINGS: AICD remains in place.  Artifact overlies the chest
otherwise.  The patient has taken a poor inspiration.  Because of
this, I cannot completely rule out basilar atelectasis or
infiltrate.  Pulmonary vascularity is normal.  No effusions.
IMPRESSION: Poor inspiration.  No definite active process.  Prominent markings
at the lung bases probably because of the poor inspiration.  Cannot
completely rule out basilar atelectasis or infiltrate.

## 2009-09-01 ENCOUNTER — Emergency Department (HOSPITAL_COMMUNITY): Admission: EM | Admit: 2009-09-01 | Discharge: 2009-09-02 | Payer: Self-pay | Admitting: Emergency Medicine

## 2009-09-05 ENCOUNTER — Encounter (INDEPENDENT_AMBULATORY_CARE_PROVIDER_SITE_OTHER): Payer: Self-pay | Admitting: Cardiovascular Disease

## 2009-09-05 ENCOUNTER — Inpatient Hospital Stay (HOSPITAL_COMMUNITY): Admission: EM | Admit: 2009-09-05 | Discharge: 2009-09-13 | Payer: Self-pay | Admitting: Emergency Medicine

## 2009-09-18 ENCOUNTER — Emergency Department (HOSPITAL_COMMUNITY)
Admission: EM | Admit: 2009-09-18 | Discharge: 2009-09-18 | Payer: Self-pay | Source: Home / Self Care | Admitting: Emergency Medicine

## 2009-10-12 ENCOUNTER — Ambulatory Visit: Payer: Self-pay | Admitting: Internal Medicine

## 2009-10-13 LAB — CONVERTED CEMR LAB
Albumin: 3.9 g/dL (ref 3.5–5.2)
Alkaline Phosphatase: 59 units/L (ref 39–117)
Bilirubin, Direct: 0.1 mg/dL (ref 0.0–0.3)
TSH: 1.64 microintl units/mL (ref 0.35–5.50)
Total Bilirubin: 1 mg/dL (ref 0.3–1.2)

## 2009-12-03 ENCOUNTER — Inpatient Hospital Stay (HOSPITAL_COMMUNITY)
Admission: EM | Admit: 2009-12-03 | Discharge: 2009-12-13 | Payer: Self-pay | Source: Home / Self Care | Attending: Cardiology | Admitting: Cardiology

## 2009-12-03 IMAGING — CT CT HEAD W/O CM
1 series · 16 of 30 positions shown, 20 images · non-contrast
Comparison: 02/21/2007.

CLINICAL DATA: Chest pain.  Headache.  Neck pain.

CT HEAD WITHOUT CONTRAST
TECHNIQUE: Contiguous axial images were obtained from the base of
the skull through the vertex without contrast.

[Series 2: head_seq 4.5 h37s st · axial · 0.43mm/px · z∈[+261,+387]mm · 16 of 32 slices shown, 20 images]
[im 2/32  brain]
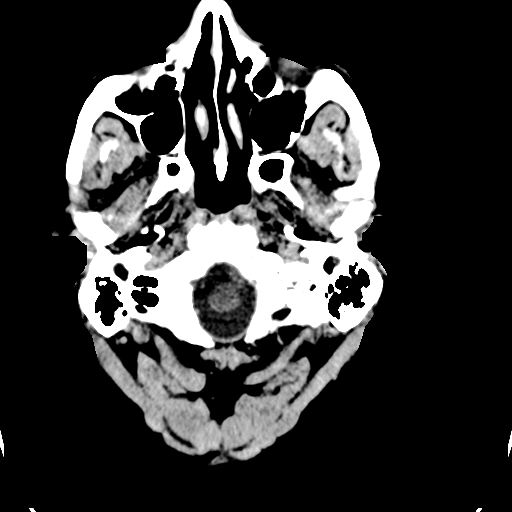
[im 2/32  bone]
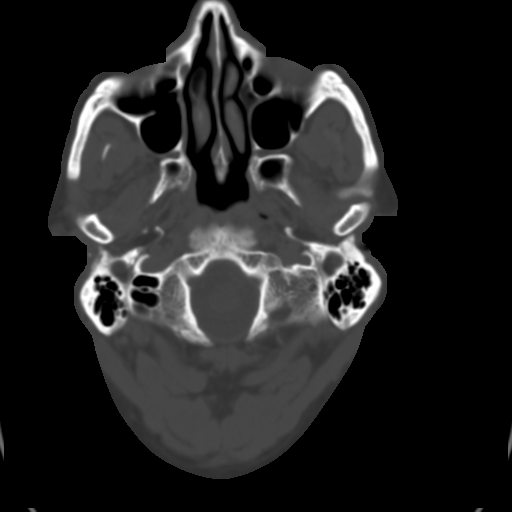
[im 4/32  brain]
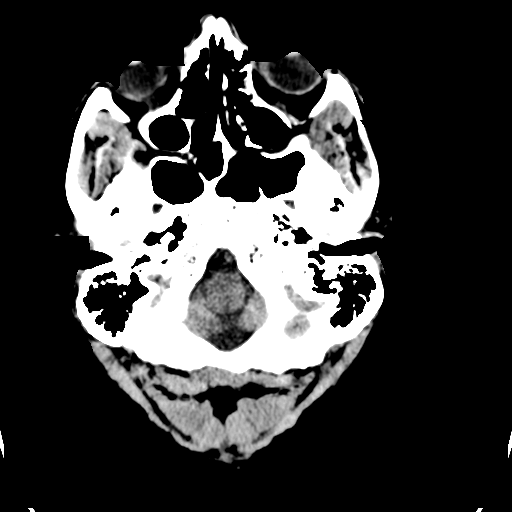
[im 6/32  brain]
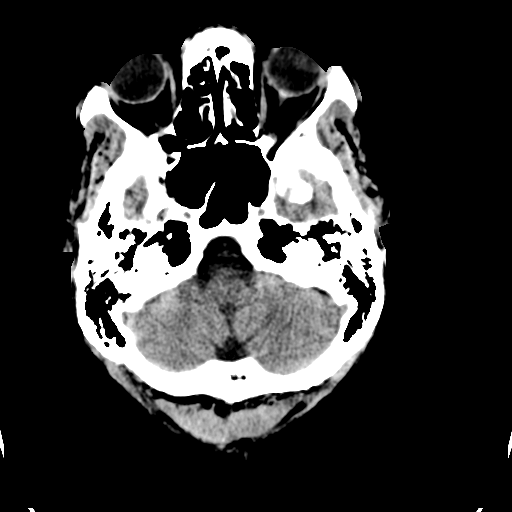
[im 8/32  brain]
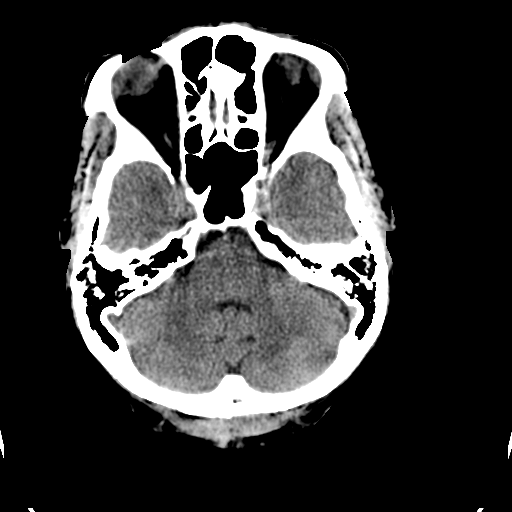
[im 9/32  brain]
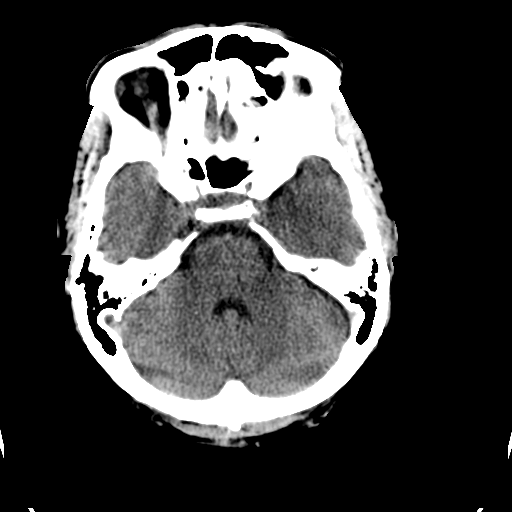
[im 9/32  bone]
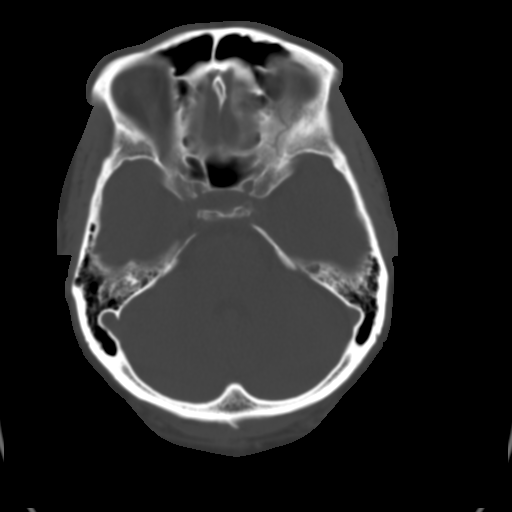
[im 11/32  brain]
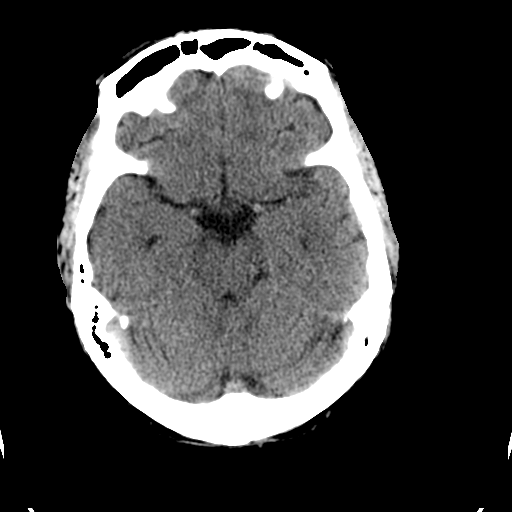
[im 13/32  brain]
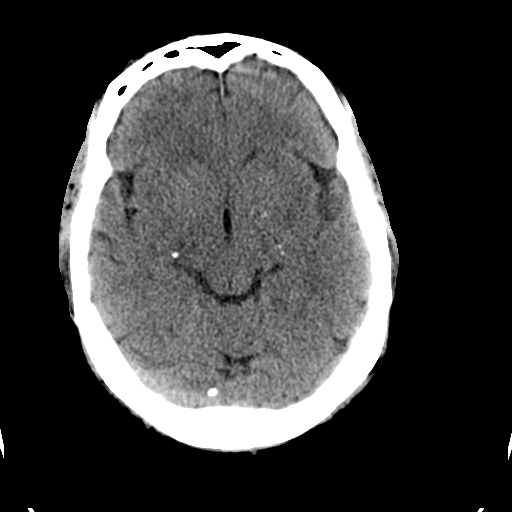
[im 15/32  brain]
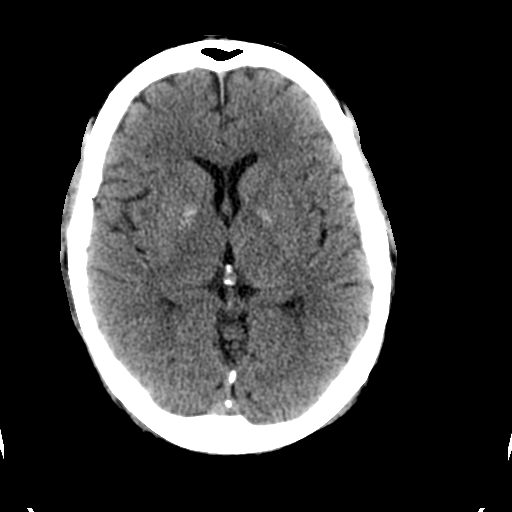
[im 17/32  brain]
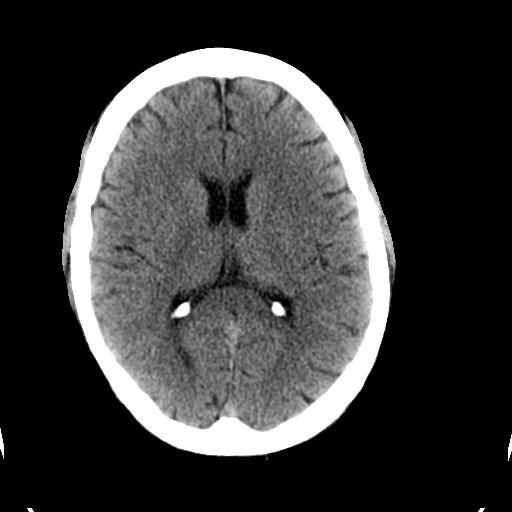
[im 17/32  bone]
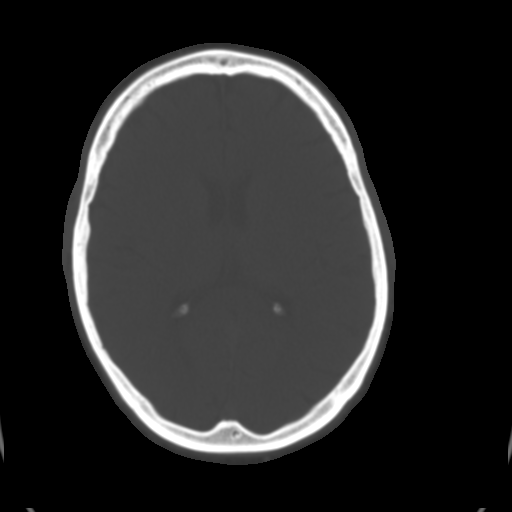
[im 19/32  brain]
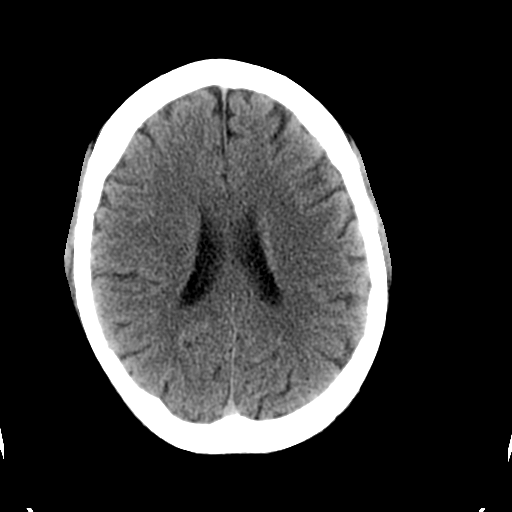
[im 21/32  brain]
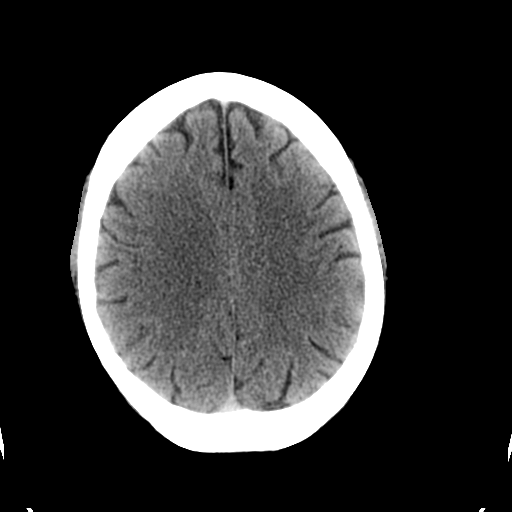
[im 23/32  brain]
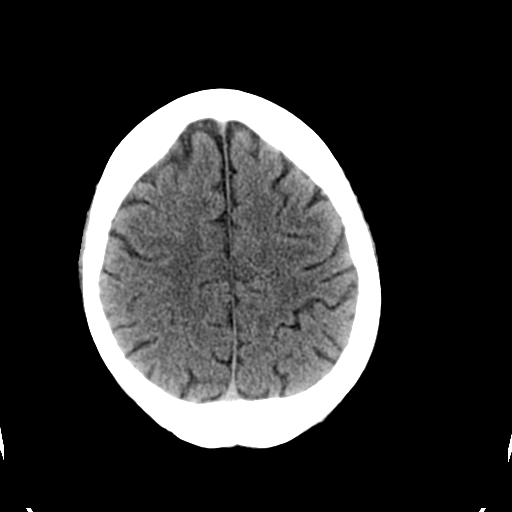
[im 24/32  brain]
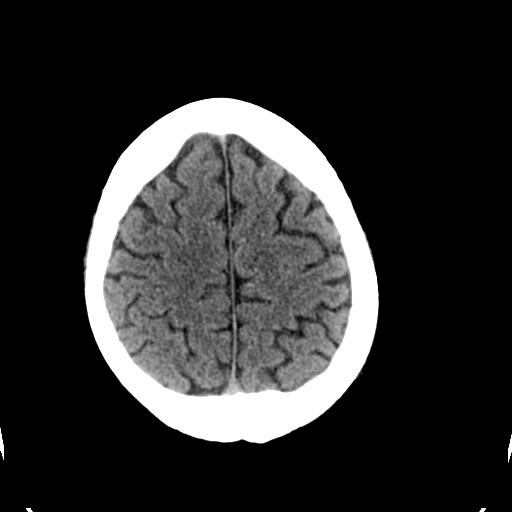
[im 24/32  bone]
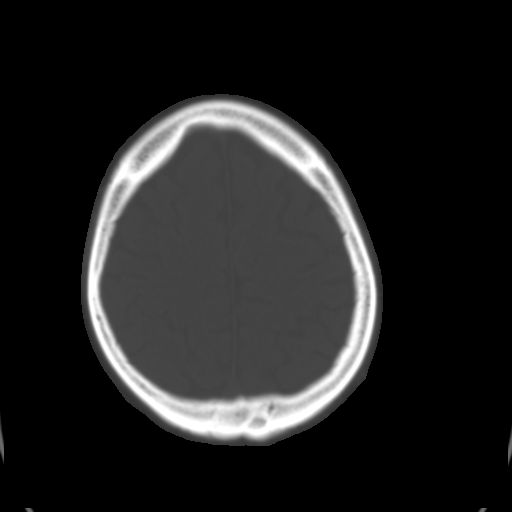
[im 26/32  brain]
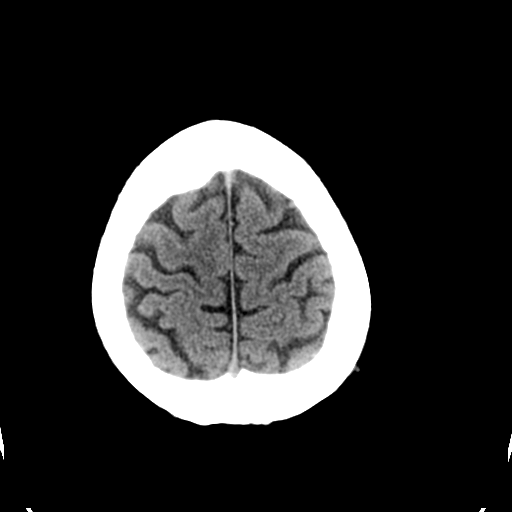
[im 28/32  brain]
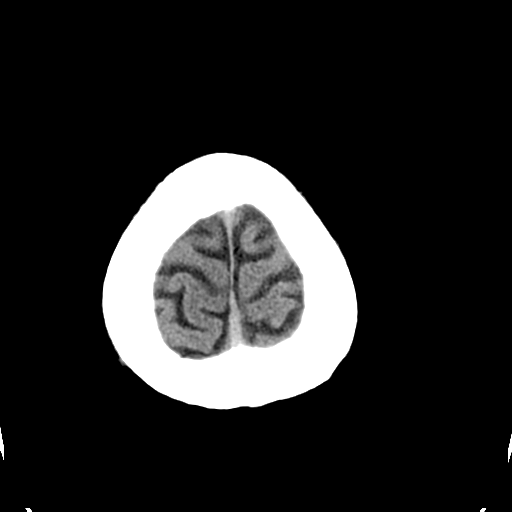
[im 30/32  brain]
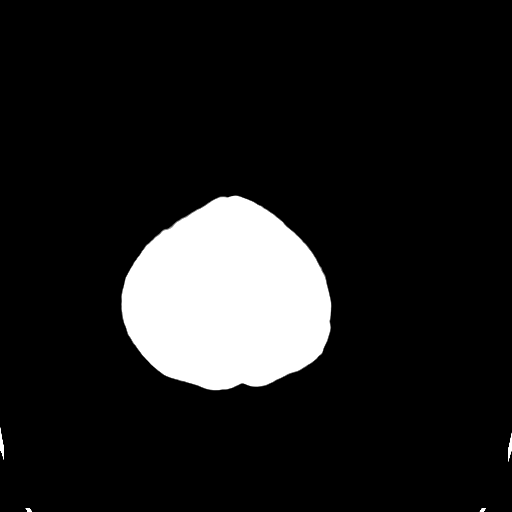

[16 of 30 positions shown; findings below may reference images not displayed]

FINDINGS: Benign basal ganglia calcifications are present
bilaterally. No mass lesion, mass effect, midline shift,
hydrocephalus, hemorrhage.  No territorial ischemia or acute
infarction. Mild ethmoid sinus mucosal thickening.
IMPRESSION: No acute intracranial abnormality.

## 2009-12-03 IMAGING — CR DG CHEST 2V
2 series · 2 of 2 positions shown · non-contrast
Comparison: 01/30/2008

CLINICAL DATA: Chest pain.  Shortness of breath.

CHEST - 2 VIEW

[w chest pa]
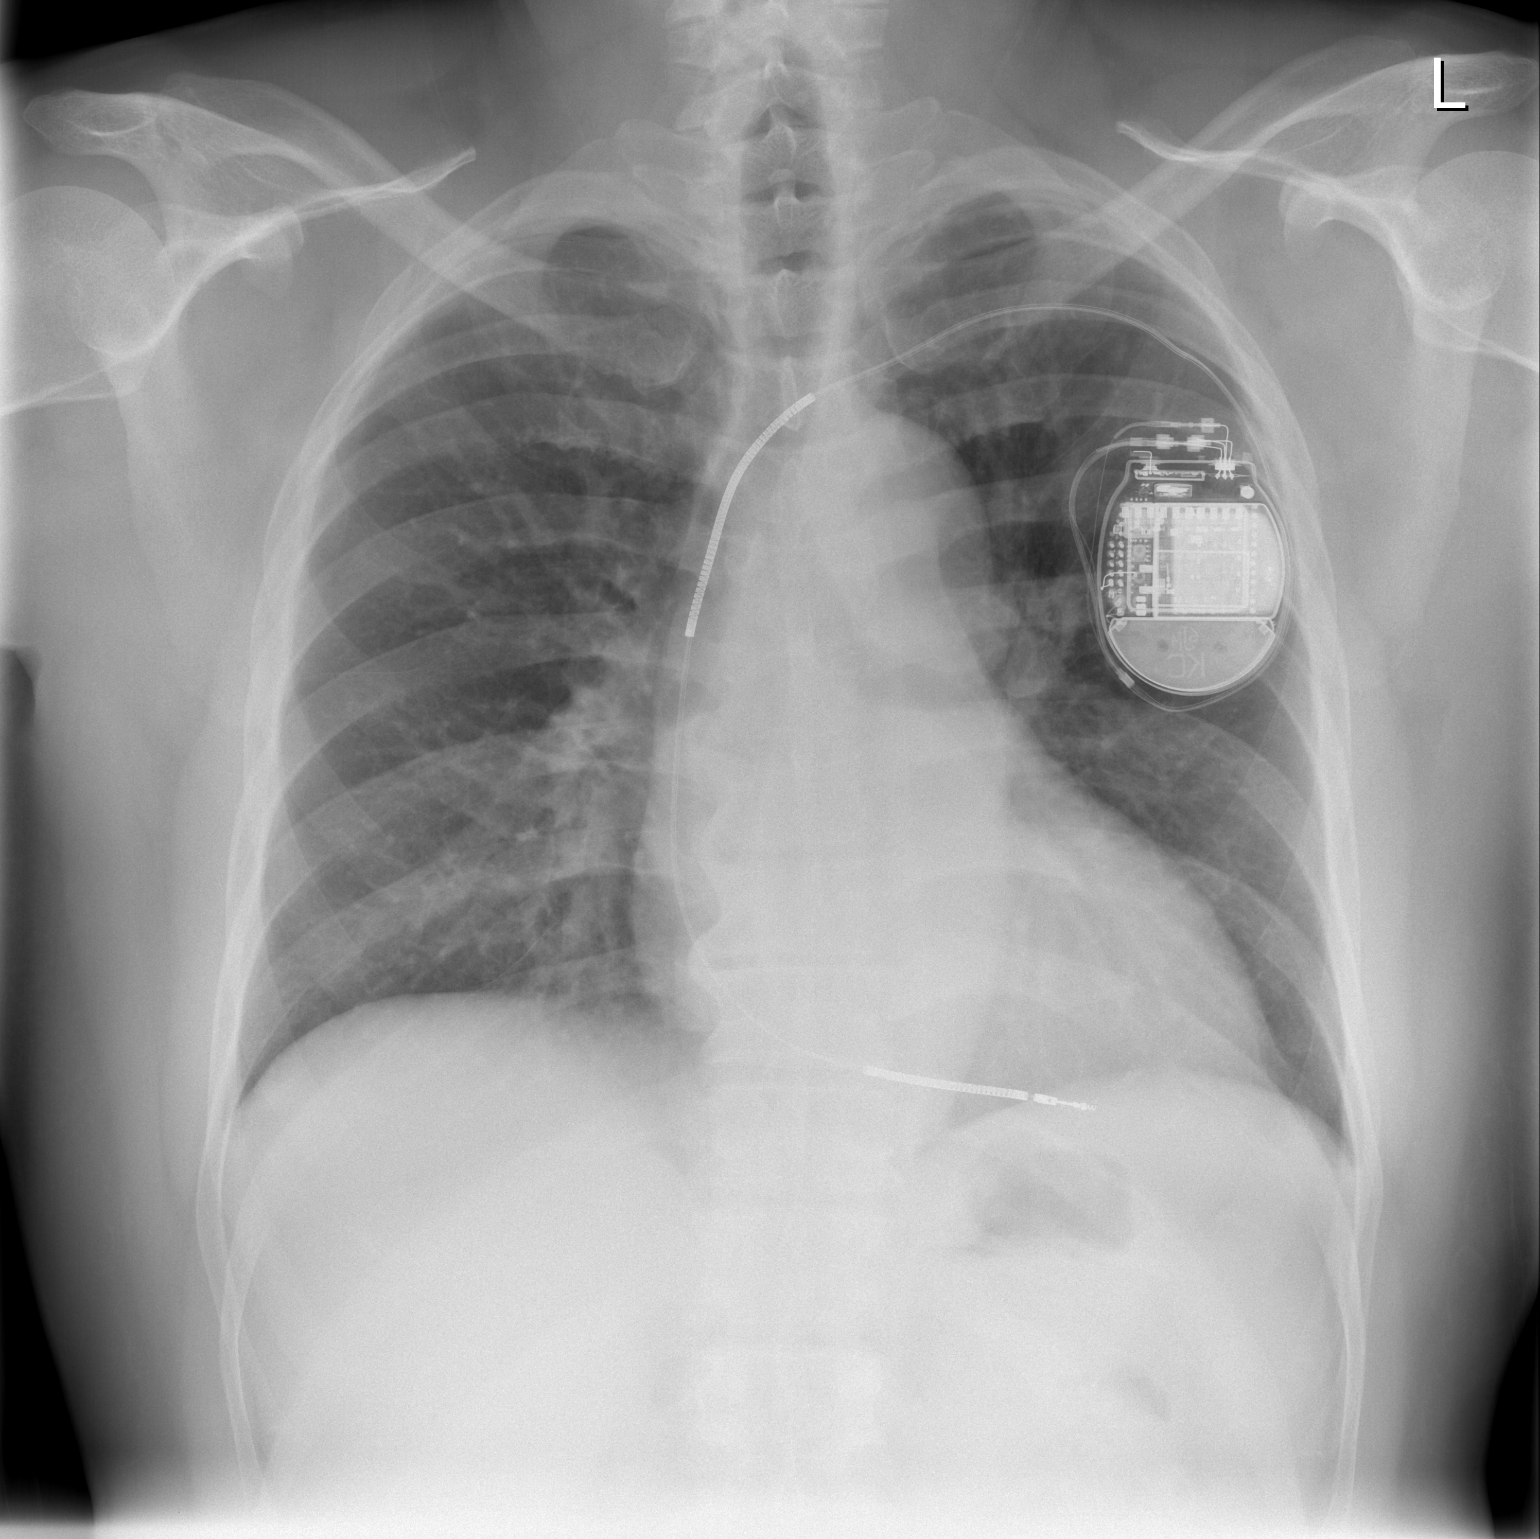

[w chest lat]
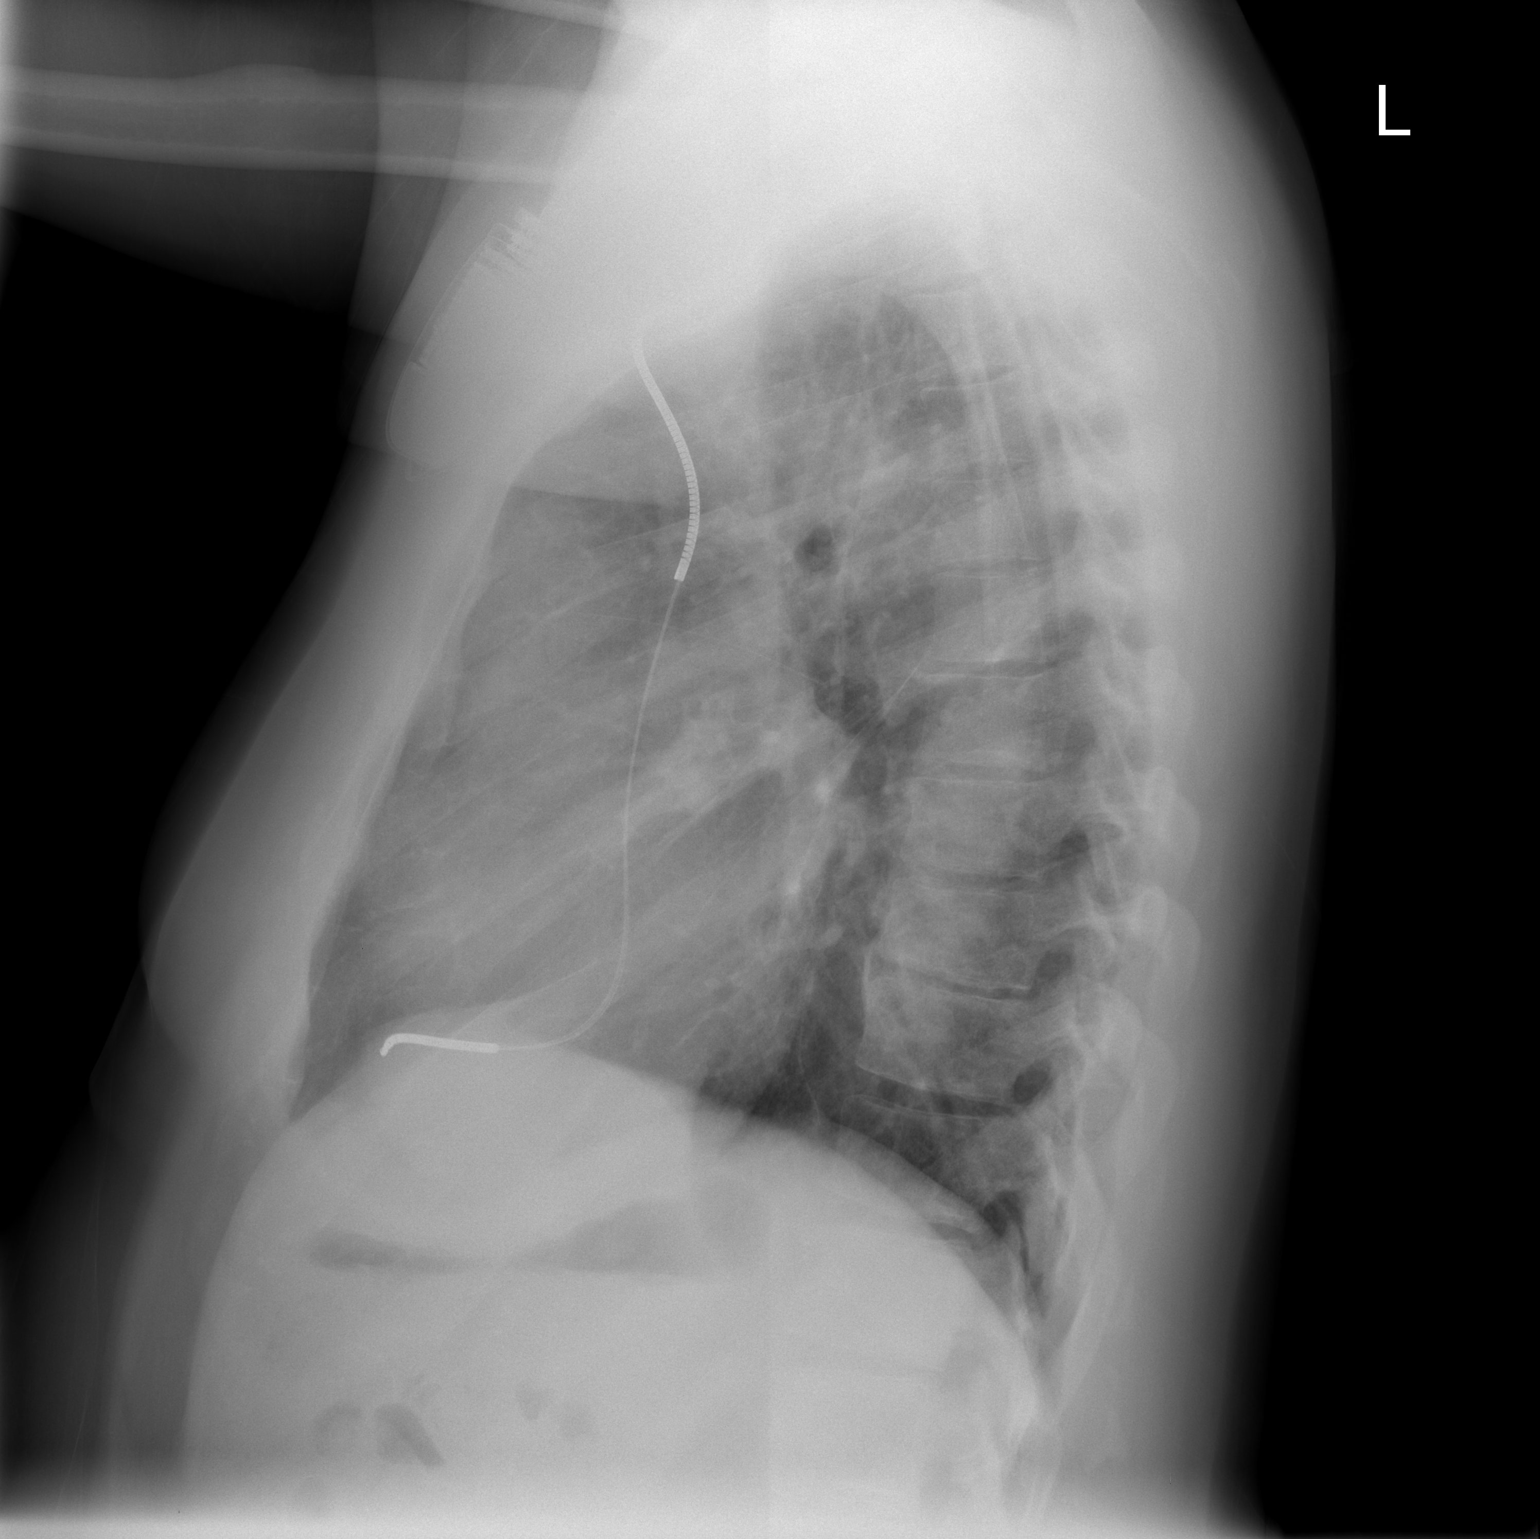

[2 of 2 positions shown; findings below may reference images not displayed]

FINDINGS: Mild cardiomegaly stable.  There is no evidence of
pulmonary infiltrate or edema.  There is no evidence of pleural
effusion.  AICD remains in appropriate position.
IMPRESSION: Stable mild cardiomegaly.  No acute findings.

## 2010-01-14 ENCOUNTER — Encounter (INDEPENDENT_AMBULATORY_CARE_PROVIDER_SITE_OTHER): Payer: Self-pay | Admitting: *Deleted

## 2010-01-23 IMAGING — CR DG CHEST 2V
2 series · 2 of 2 positions shown · non-contrast
Comparison: 06/21/2008 and earlier.

CLINICAL DATA: 58-year-old male chest pain.  Sarcoidosis.

CHEST - 2 VIEW

[w chest pa]
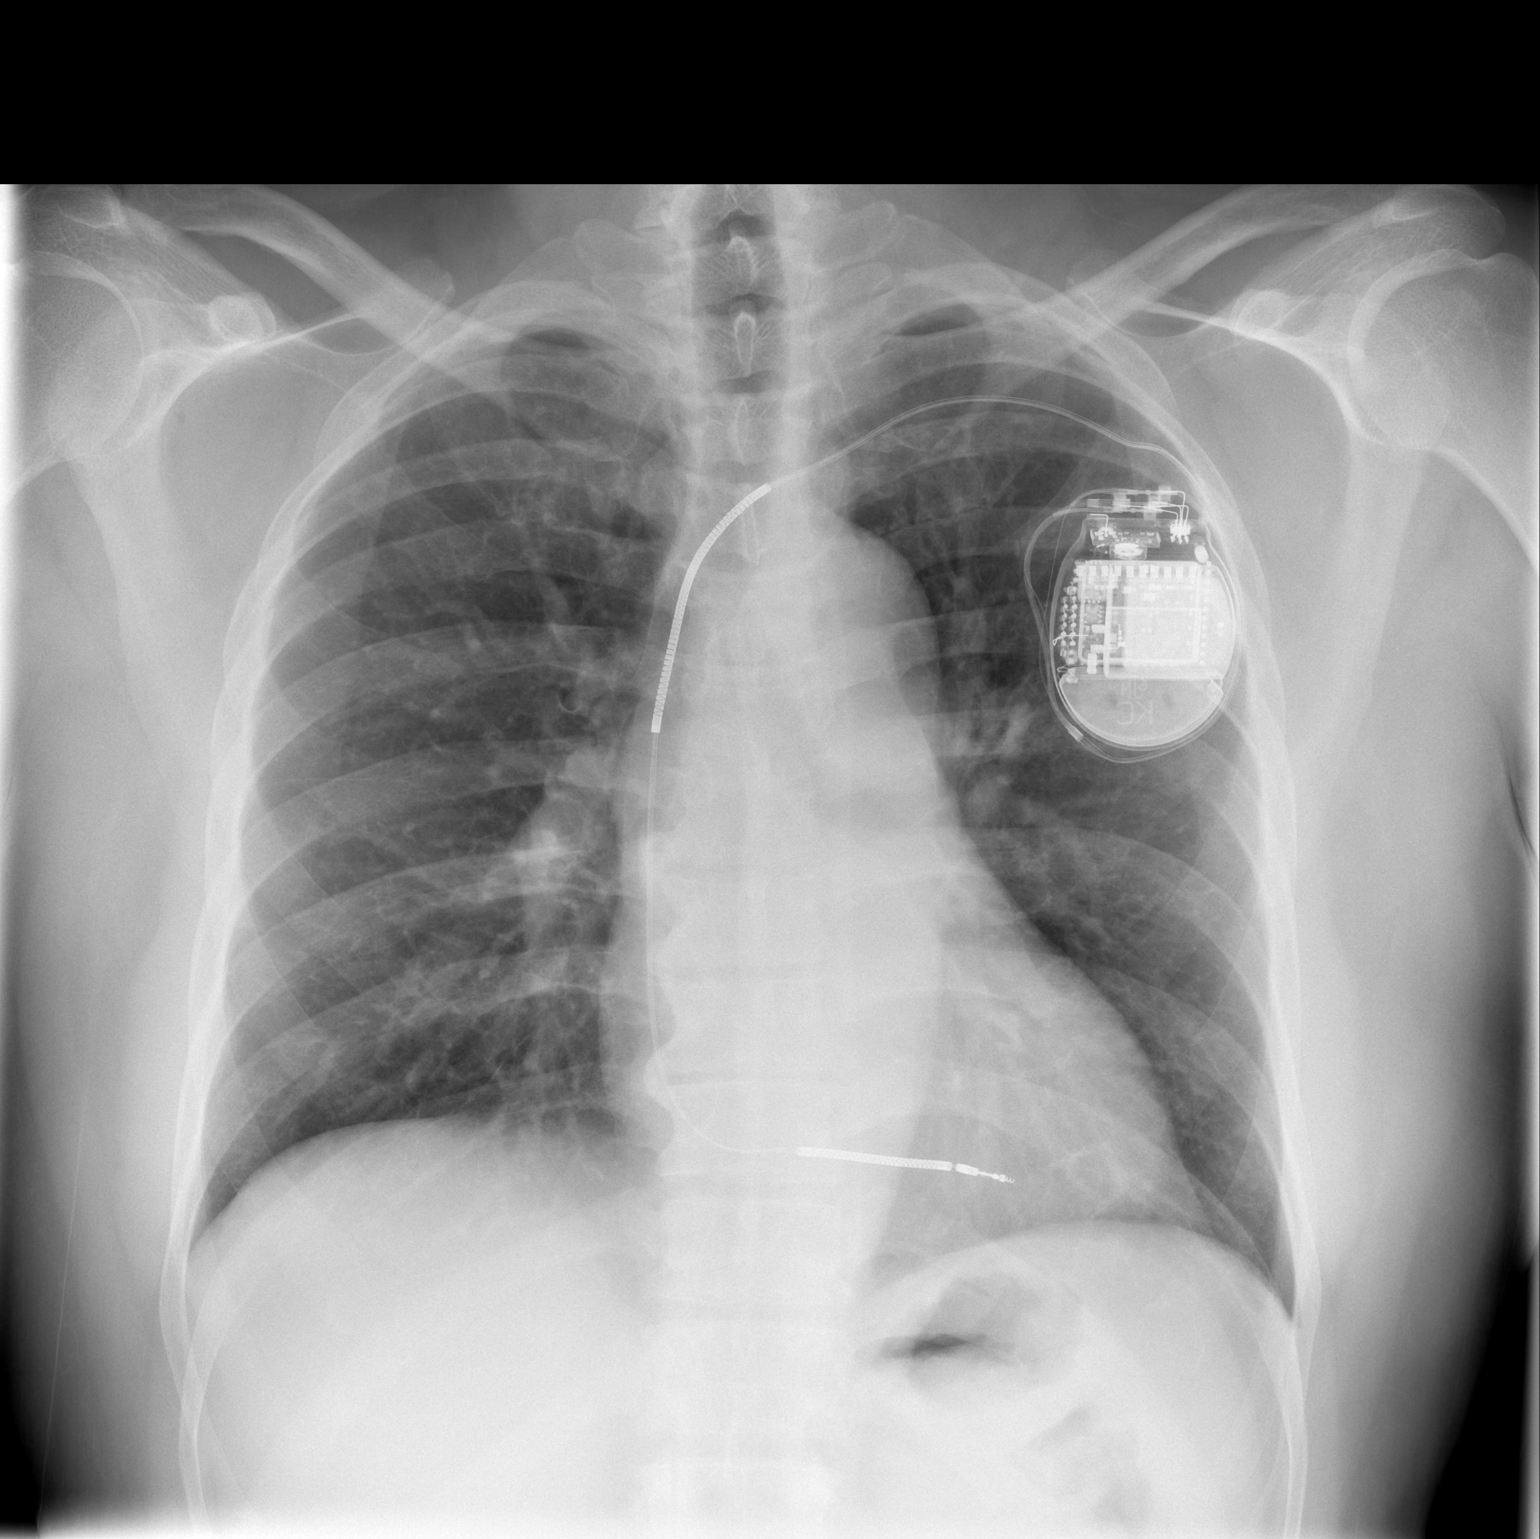

[w chest lat]
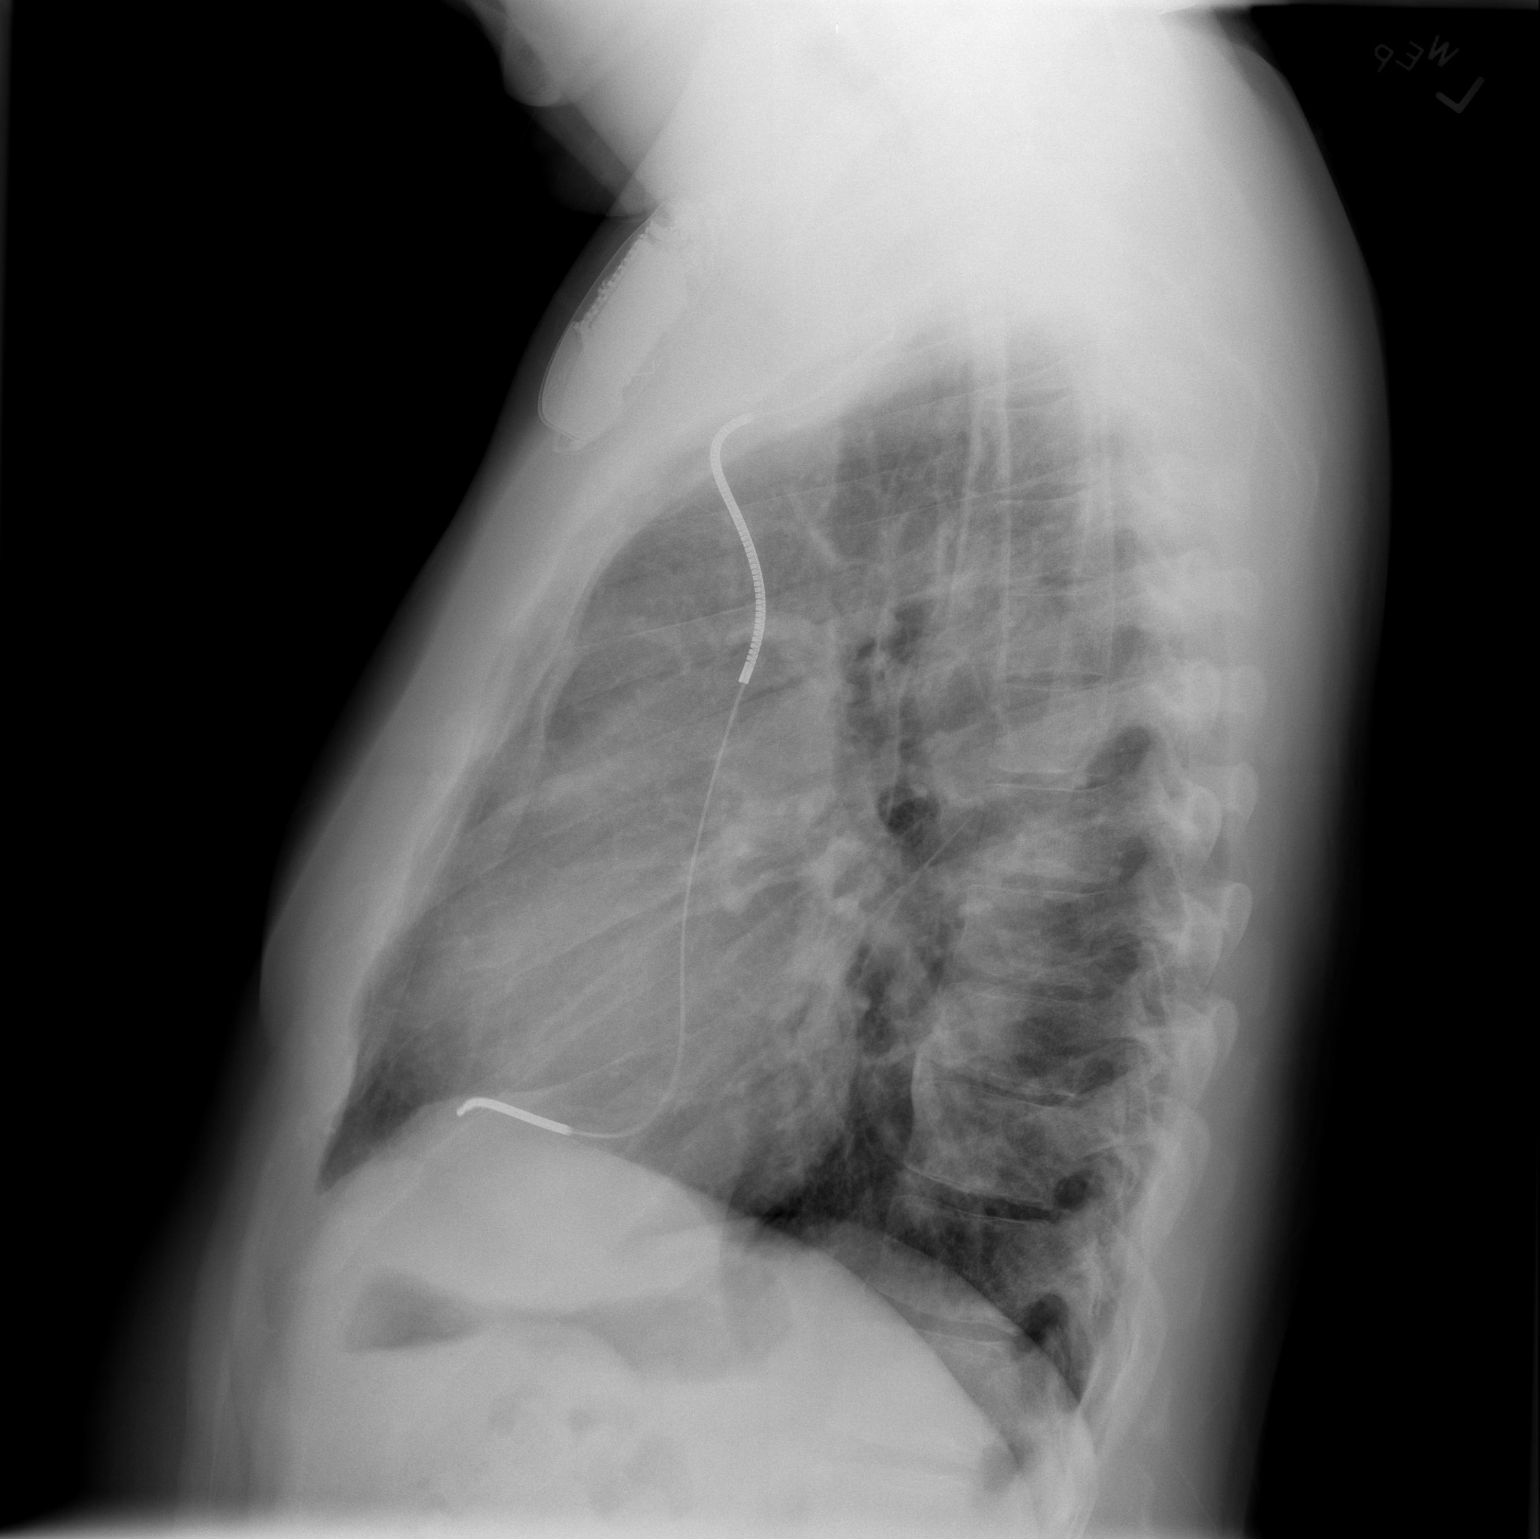

[2 of 2 positions shown; findings below may reference images not displayed]

FINDINGS: Stable left chest cardiac AICD. Stable cardiomegaly and
mediastinal contours.  Stable lung volumes.  No pneumothorax,
pulmonary edema, pleural effusion, consolidation, or confluent
airspace opacity. Stable visualized osseous structures.
IMPRESSION: No acute cardiopulmonary abnormality.

## 2010-02-01 NOTE — Cardiovascular Report (Signed)
Summary: Office Visit   Office Visit   Imported By: Roderic Ovens 10/14/2009 15:15:44  _____________________________________________________________________  External Attachment:    Type:   Image     Comment:   External Document

## 2010-02-01 NOTE — Assessment & Plan Note (Signed)
Summary: f2y/pacer firing   History of Present Illness: [name] returns today for followup.  He is a pleasant 60 yo man with a h/o DCM and class 2.  He is s/p ICD implant in 2009.  He denies any intercurrent ICD therapies.  No c/p or sob.  He denies peripheral edema.  He was hospitalized with Chest pain and CHF 6 weeks ago.  He does admit to dietary indiscretion.  Current Medications (verified): 1)  Carvedilol 12.5 Mg Tabs (Carvedilol) .... Take One Tablet By Mouth Twice A Day 2)  Furosemide 80 Mg Tabs (Furosemide) .... Take One Tablet By Mouth Two Times A Day 3)  Aldactone 25 Mg Tabs (Spironolactone) .... Two Times A Day 4)  Lisinopril 10 Mg Tabs (Lisinopril) .... Take One Tablet By Mouth Daily 5)  Protonix 40 Mg Tbec (Pantoprazole Sodium) .... Take One Tablet By Mouth Once Daily.  Allergies (verified): No Known Drug Allergies  Review of Systems  The patient denies chest pain, syncope, dyspnea on exertion, and peripheral edema.    Vital Signs:  Patient profile:   60 year old male Height:      74 inches Weight:      237 pounds BMI:     30.54 Pulse rate:   80 / minute BP sitting:   138 / 104  (left arm)  Vitals Entered By: Laurance Flatten CMA (April 27, 2009 4:30 PM)  Physical Exam  General:  Well developed, well nourished, in no acute distress.  HEENT: normal Neck: supple. No JVD. Carotids 2+ bilaterally no bruits Cor: RRR with a soft S4 gallop. Lungs: CTA with no wheezes, rales, or rhonchi. Well healed ICD incision. Ab: soft, nontender. nondistended. No HSM. Good bowel sounds Ext: warm. no cyanosis, clubbing or edema Neuro: alert and oriented. Grossly nonfocal. affect pleasant     ICD Specifications Following MD:  Lewayne Bunting, MD     ICD Vendor:  [employer]     ICD Model Number:  1207     ICD Serial Number:  [id number] ICD DOI:  01/14/2007     ICD Implanting MD:  Lewayne Bunting, MD  Lead 1:    Location: RV     DOI: 01/14/2007     Model #: 1308     Serial #: [id number]      Status: active  ICD Follow Up Remote Check?  No Battery Voltage:  3.14 V     Charge Time:  10.9 seconds     Battery Est. Longevity:  5.9 years Underlying rhythm:  SR ICD Dependent:  No       ICD Device Measurements Right Ventricle:  Amplitude: 12 mV, Impedance: 430 ohms, Threshold: 1.25 V at 0.5 msec Shock Impedance: 49 ohms   Episodes Coumadin:  No Shock:  5     ATP:  13     Nonsustained:  0     Ventricular Pacing:  <1%  Brady Parameters Mode VVI     Lower Rate Limit:  40      Tachy Zones VF:  240     VT:  210     VT1:  184     Tech Comments:  SVT discriminator time out off, sinus redect interval decreased to 3.  Checked by [employer].  ROV 3 months with [name]. Altha Harm, LPN  April 27, 2009 4:53 PM  MD Comments:  Agree with above.  Impression & Recommendations:  Problem # 1:  IMPLANTATION OF DEFIBRILLATOR, HX OF (ICD-V45.02) His device is  working normally.  Will recheck in several months. His device is reprogrammed to minimize ICD shocks for rapid atrial fib.  Problem # 2:  CONGESTIVE HEART FAILURE, SYSTOLIC DYSFUNCTION (ICD-428.20) His symptoms are class 2.  I have asked him to increase his carvedilol to 25 mg twice daily. A low sodium diet is recommended. His updated medication list for this problem includes:    Carvedilol 12.5 Mg Tabs (Carvedilol) .Marland Kitchen... Take one tablet by mouth twice a day    Furosemide 80 Mg Tabs (Furosemide) .Marland Kitchen... Take one tablet by mouth two times a day    Aldactone 25 Mg Tabs (Spironolactone) .Marland Kitchen..Marland Kitchen Two times a day    Lisinopril 10 Mg Tabs (Lisinopril) .Marland Kitchen... Take one tablet by mouth daily  Problem # 3:  VENTRICULAR TACHYCARDIA (ICD-427.1) He has had no VT since his last visit, though he has been shocked for what appears to be rapid atrial fibrillation. His updated medication list for this problem includes:    Carvedilol 12.5 Mg Tabs (Carvedilol) .Marland Kitchen... Take one tablet by mouth twice a day    Lisinopril 10 Mg Tabs (Lisinopril) .Marland Kitchen... Take one tablet  by mouth daily  Patient Instructions: 1)  Your physician recommends that you schedule a follow-up appointment in: 6 months with Dr Ladona Ridgel 2)  Your physician has recommended you make the following change in your medication: increase Carvedilol to 25mg (take 2 12.5 mg tablets twice daily) two times a day

## 2010-02-01 NOTE — Assessment & Plan Note (Signed)
Summary: device/saf   Visit Type:  Follow-up   History of Present Illness: Mr. Nill returns today after receiving another ICD shock. He states that he has had more and more SOB over the past few days.  He was in his USOH until last night when he felt dizzy and was shocked.  After he felt well.  After the episode he felt better.  He thinks he is more sob than usual.  He denies dietary indiscretion.  Current Medications (verified): 1)  Carvedilol 25 Mg Tabs (Carvedilol) .... Take One Tablet By Mouth Once Daily 2)  Furosemide 80 Mg Tabs (Furosemide) .... Take One Tablet By Mouth Once Daily 3)  Aldactone 25 Mg Tabs (Spironolactone) .... Once Daily 4)  Lisinopril 10 Mg Tabs (Lisinopril) .... Take One Tablet By Mouth Daily 5)  Protonix 40 Mg Tbec (Pantoprazole Sodium) .... Take One Tablet By Mouth Once Daily. 6)  Colcrys 0.6 Mg Tabs (Colchicine) .... Once Daily 7)  Warfarin Sodium 10 Mg Tabs (Warfarin Sodium) .... Use As Directed By Anticoagulation Clinic 8)  Allopurinol 100 Mg Tabs (Allopurinol) .... As Needed  Allergies: 1)  ! 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  The patient denies chest pain, syncope, and peripheral edema.    Vital Signs:  Patient profile:   60 year old male Height:      74 inches Weight:      227 pounds BMI:     29.25 Pulse rate:   71 / minute BP sitting:   82 / 66  (left arm)  Vitals Entered By: Laurance Flatten CMA (October 12, 2009 2:33 PM)  Physical Exam  General:  Well developed, well  nourished, in no acute distress.  HEENT: normal Neck: supple. No JVD. Carotids 2+ bilaterally no bruits Cor: RRR with a soft S4 gallop. Lungs: CTA with no wheezes, rales, or rhonchi. Well healed ICD incision. Ab: soft, nontender. nondistended. No HSM. Good bowel sounds Ext: warm. no cyanosis, clubbing or edema Neuro: alert and oriented. Grossly nonfocal. affect pleasant     ICD Specifications Following MD:  Lewayne Bunting, MD     ICD Vendor:  [employer]     ICD Model Number:  1207     ICD Serial Number:  [id number] ICD DOI:  01/14/2007     ICD Implanting MD:  Lewayne Bunting, MD  Lead 1:    Location: RV     DOI: 01/14/2007     Model #: 8119     Serial #: [id number]     Status: active  ICD Follow Up Remote Check?  No Battery Voltage:  3.14 V     Charge Time:  10.9 seconds     Battery Est. Longevity:  5.8 years Underlying rhythm:  SR ICD Dependent:  No       ICD Device Measurements Right Ventricle:  Amplitude: 12 mV, Impedance: 430 ohms, Threshold: 1.25 V at 0.5 msec Shock Impedance: 44 ohms   Episodes Coumadin:  No Shock:  1     ATP:  7     ICD Appropriate Therapy?  No Ventricular Pacing:  <1%  Brady Parameters Mode VVI     Lower  Rate Limit:  40      Tachy Zones VF:  240     VT:  210     VT1:  184     Next Remote Date:  01/13/2010     Next Cardiology Appt Due:  10/03/2010 Tech Comments:  Reprogrammed to 2 zone device.  ATP and shock therapy delivered for A-fib with RVR, + coumadin.  [facility] transmissions every 3 months. ROV 1 year with [name]. Altha Harm, LPN  October 12, 2009 2:54 PM  MD Comments:  Agree with above.  Impression & Recommendations:  Problem # 1:  VENTRICULAR TACHYCARDIA (ICD-427.1)  I suspect his recent shock is due to atrial fib.  Will followup in several months. His updated medication list for this problem includes:    Carvedilol 25 Mg Tabs (Carvedilol) .Marland Kitchen... Take 1/2 tablet by mouth once daily    Lisinopril 10 Mg Tabs (Lisinopril) .Marland Kitchen... Take one tablet by  mouth daily    Warfarin Sodium 10 Mg Tabs (Warfarin sodium) ..... Use as directed by anticoagulation clinic    Amiodarone Hcl 200 Mg Tabs (Amiodarone hcl) ..... One by mouth daily  Orders: TLB-TSH (Thyroid Stimulating Hormone) (84443-TSH) TLB-Hepatic/Liver Function Pnl (80076-HEPATIC)  His updated medication list for this problem includes:    Carvedilol 25 Mg Tabs (Carvedilol) .Marland Kitchen... Take 1/2 tablet by mouth once daily    Lisinopril 10 Mg Tabs (Lisinopril) .Marland Kitchen... Take one tablet by mouth daily    Warfarin Sodium 10 Mg Tabs (Warfarin sodium) ..... Use as directed by anticoagulation clinic    Amiodarone Hcl 200 Mg Tabs (Amiodarone hcl) ..... One by mouth daily  Problem # 2:  IMPLANTATION OF DEFIBRILLATOR, HX OF (ICD-V45.02) His device is working normally.  Will recheck in several months.   Problem # 3:  CONGESTIVE HEART FAILURE, SYSTOLIC DYSFUNCTION (ICD-428.20) His symptoms remain class 2-3.  Will continue current meds and a low sodium diet except for increasing lasix for 3 days. His updated medication list for this problem includes:    Carvedilol 25 Mg Tabs (Carvedilol) .Marland Kitchen... Take 1/2 tablet by mouth once daily    Furosemide 80 Mg Tabs (Furosemide) .Marland Kitchen... Take one tablet by mouth once daily    Aldactone 25 Mg Tabs (Spironolactone) ..... Once daily    Lisinopril 10 Mg Tabs (Lisinopril) .Marland Kitchen... Take one tablet by mouth daily    Warfarin Sodium 10 Mg Tabs (Warfarin sodium) ..... Use as directed by anticoagulation clinic    Amiodarone Hcl 200 Mg Tabs (Amiodarone hcl) ..... One by mouth daily  Orders: TLB-TSH (Thyroid Stimulating Hormone) (84443-TSH) TLB-Hepatic/Liver Function Pnl (80076-HEPATIC)  Patient Instructions: 1)  Your physician wants you to follow-up in: 6 months with Dr Court Joy will receive a reminder letter in the mail two months in advance. If you don't receive a letter, please call our office to schedule the follow-up appointment. 2)  Your physician has recommended you make  the following change in your medication: decrease Carvedilol to 12.5mg  two times a day(1/2 tablet twice daily of the 25mg ), increase Furosemide to 80mg  two times a day for 3 days then go back to once daily and start Amiodarone 200mg  daily 3)  Your physician recommends that you return for lab work today Prescriptions: AMIODARONE HCL 200 MG TABS (AMIODARONE HCL) one by mouth daily  #30 x 11   Entered by:   Dennis Bast, RN, BSN   Authorized by:   Laren Boom, MD, [facility]   Signed by:   Dennis Bast, RN, BSN  on 10/12/2009   Method used:   Electronically to        [facility] [phone]* (retail)       [address]       [place], [place]  [id number]       Ph: [phone] or [phone]       Fax: [phone]   RxID:   [id number]

## 2010-02-01 NOTE — Progress Notes (Signed)
Summary: calling regarding medication  Phone Note Call from Patient Call back at [phone]   Caller: Patient Summary of Call: Pt calling regarding medications Initial call taken by: Judie Grieve,  April 29, 2009 8:37 AM  Follow-up for Phone Call        called and spoke with lady at home number.  She will have him call me back Dennis Bast, RN, BSN  April 29, 2009 8:55 AM pt is already on Carvedilol 25mg  two times a day Will discuss with Dr Ladona Ridgel and call pt back.make sure he is taking.  Left message to call me  Dennis Bast, RN, BSN  April 29, 2009 3:04 PM

## 2010-02-03 ENCOUNTER — Ambulatory Visit: Admit: 2010-02-03 | Payer: Self-pay | Admitting: Internal Medicine

## 2010-02-03 NOTE — Letter (Signed)
Summary: Device-Delinquent Phone Journalist, newspaper, Main Office  [address]   [place], [place] [address]   Phone: [phone]  Fax: [phone]     January 14, 2010 MRN: [id number]   [facility] [address] [place], [place]  [address]   Dear Mr. Kue,  According to our records, you were scheduled for a device phone transmission on 01-13-2010.     We did not receive any results from this check.  If you transmitted on your scheduled day, please call us to help troubleshoot your system.  If you forgot to send your transmission, please send one upon receipt of this letter.  Thank you,   Arch[facility]ural [facility]

## 2010-02-13 ENCOUNTER — Emergency Department (HOSPITAL_COMMUNITY): Payer: PRIVATE HEALTH INSURANCE

## 2010-02-13 ENCOUNTER — Emergency Department (HOSPITAL_COMMUNITY)
Admission: EM | Admit: 2010-02-13 | Discharge: 2010-02-13 | Disposition: A | Discharge status: Home / Self Care | Payer: PRIVATE HEALTH INSURANCE | Attending: Emergency Medicine | Admitting: Emergency Medicine

## 2010-02-13 DIAGNOSIS — Z7901 Long term (current) use of anticoagulants: Secondary | ICD-10-CM | POA: Insufficient documentation

## 2010-02-13 DIAGNOSIS — R079 Chest pain, unspecified: Secondary | ICD-10-CM | POA: Insufficient documentation

## 2010-02-13 DIAGNOSIS — I509 Heart failure, unspecified: Secondary | ICD-10-CM | POA: Insufficient documentation

## 2010-02-13 DIAGNOSIS — E785 Hyperlipidemia, unspecified: Secondary | ICD-10-CM | POA: Insufficient documentation

## 2010-02-13 DIAGNOSIS — K219 Gastro-esophageal reflux disease without esophagitis: Secondary | ICD-10-CM | POA: Insufficient documentation

## 2010-02-13 DIAGNOSIS — R6889 Other general symptoms and signs: Secondary | ICD-10-CM | POA: Insufficient documentation

## 2010-02-13 DIAGNOSIS — Z79899 Other long term (current) drug therapy: Secondary | ICD-10-CM | POA: Insufficient documentation

## 2010-02-13 DIAGNOSIS — I10 Essential (primary) hypertension: Secondary | ICD-10-CM | POA: Insufficient documentation

## 2010-02-13 DIAGNOSIS — Z950 Presence of cardiac pacemaker: Secondary | ICD-10-CM | POA: Insufficient documentation

## 2010-02-13 DIAGNOSIS — I252 Old myocardial infarction: Secondary | ICD-10-CM | POA: Insufficient documentation

## 2010-02-13 DIAGNOSIS — I4891 Unspecified atrial fibrillation: Secondary | ICD-10-CM | POA: Insufficient documentation

## 2010-02-13 LAB — BASIC METABOLIC PANEL
BUN: 18 mg/dL (ref 6–23)
Creatinine, Ser: 1.67 mg/dL — ABNORMAL HIGH (ref 0.4–1.5)
GFR calc non Af Amer: 42 mL/min — ABNORMAL LOW (ref >60)
Potassium: 4.2 mEq/L (ref 3.5–5.1)

## 2010-02-13 LAB — CBC
HCT: 41.6 % (ref 39.0–52.0)
Platelets: 190 10*3/uL (ref 150–400)
RDW: 12.7 % (ref 11.5–15.5)
WBC: 6.6 10*3/uL (ref 4.0–10.5)

## 2010-02-13 LAB — DIFFERENTIAL
Basophils Absolute: 0 10*3/uL (ref 0.0–0.1)
Eosinophils Relative: 3 % (ref 0–5)
Lymphocytes Relative: 24 % (ref 12–46)
Neutrophils Relative %: 63 % (ref 43–77)

## 2010-02-13 LAB — URINE MICROSCOPIC-ADD ON

## 2010-02-13 LAB — CK TOTAL AND CKMB (NOT AT ARMC): Total CK: 165 U/L (ref 7–232)

## 2010-02-13 LAB — PROTIME-INR: INR: 0.96 (ref 0.00–1.49)

## 2010-02-13 LAB — URINALYSIS, ROUTINE W REFLEX MICROSCOPIC
Bilirubin Urine: NEGATIVE
Hgb urine dipstick: NEGATIVE
Specific Gravity, Urine: 1.022 (ref 1.005–1.030)
pH: 6 (ref 5.0–8.0)

## 2010-02-13 LAB — BRAIN NATRIURETIC PEPTIDE: Pro B Natriuretic peptide (BNP): 685 pg/mL — ABNORMAL HIGH (ref 0.0–100.0)

## 2010-02-13 LAB — POCT CARDIAC MARKERS
CKMB, poc: 2.1 ng/mL (ref 1.0–8.0)
Troponin i, poc: <0.05 ng/mL (ref 0.00–0.09)

## 2010-02-15 ENCOUNTER — Telehealth: Payer: Self-pay | Admitting: Internal Medicine

## 2010-02-21 ENCOUNTER — Encounter (INDEPENDENT_AMBULATORY_CARE_PROVIDER_SITE_OTHER): Payer: Self-pay | Admitting: *Deleted

## 2010-02-23 NOTE — Progress Notes (Signed)
Summary: no device  Phone Note Call from Patient Call back at [phone]   Caller: Patient Reason for Call: Talk to Nurse Summary of Call: pt states he does not have a deivce. pt states he keeps getting letters re transmitting when he does not have a device. Initial call taken by: Roe Coombs,  February 16, 2010 8:07 AM  Follow-up for Phone Call        pt has not received transmitter to send merlin transmissions.  transmitter ordered for patient.  pt to send transmission once received. Vella Kohler  February 16, 2010 5:16 PM

## 2010-02-27 ENCOUNTER — Emergency Department (HOSPITAL_COMMUNITY)
Admission: EM | Admit: 2010-02-27 | Discharge: 2010-02-27 | Disposition: A | Discharge status: Home / Self Care | Payer: PRIVATE HEALTH INSURANCE | Attending: Emergency Medicine | Admitting: Emergency Medicine

## 2010-02-27 ENCOUNTER — Emergency Department (HOSPITAL_COMMUNITY): Payer: PRIVATE HEALTH INSURANCE

## 2010-02-27 DIAGNOSIS — I509 Heart failure, unspecified: Secondary | ICD-10-CM | POA: Insufficient documentation

## 2010-02-27 DIAGNOSIS — Z95810 Presence of automatic (implantable) cardiac defibrillator: Secondary | ICD-10-CM | POA: Insufficient documentation

## 2010-02-27 DIAGNOSIS — I10 Essential (primary) hypertension: Secondary | ICD-10-CM | POA: Insufficient documentation

## 2010-02-27 DIAGNOSIS — I4891 Unspecified atrial fibrillation: Secondary | ICD-10-CM | POA: Insufficient documentation

## 2010-02-27 DIAGNOSIS — D869 Sarcoidosis, unspecified: Secondary | ICD-10-CM | POA: Insufficient documentation

## 2010-02-27 DIAGNOSIS — R0789 Other chest pain: Secondary | ICD-10-CM | POA: Insufficient documentation

## 2010-02-27 DIAGNOSIS — R42 Dizziness and giddiness: Secondary | ICD-10-CM | POA: Insufficient documentation

## 2010-02-27 DIAGNOSIS — K219 Gastro-esophageal reflux disease without esophagitis: Secondary | ICD-10-CM | POA: Insufficient documentation

## 2010-02-27 DIAGNOSIS — R209 Unspecified disturbances of skin sensation: Secondary | ICD-10-CM | POA: Insufficient documentation

## 2010-02-27 DIAGNOSIS — I4510 Unspecified right bundle-branch block: Secondary | ICD-10-CM | POA: Insufficient documentation

## 2010-02-27 DIAGNOSIS — E785 Hyperlipidemia, unspecified: Secondary | ICD-10-CM | POA: Insufficient documentation

## 2010-02-27 DIAGNOSIS — R110 Nausea: Secondary | ICD-10-CM | POA: Insufficient documentation

## 2010-02-27 DIAGNOSIS — M79609 Pain in unspecified limb: Secondary | ICD-10-CM | POA: Insufficient documentation

## 2010-02-27 DIAGNOSIS — I4949 Other premature depolarization: Secondary | ICD-10-CM | POA: Insufficient documentation

## 2010-02-27 DIAGNOSIS — R0602 Shortness of breath: Secondary | ICD-10-CM | POA: Insufficient documentation

## 2010-02-27 DIAGNOSIS — I252 Old myocardial infarction: Secondary | ICD-10-CM | POA: Insufficient documentation

## 2010-02-27 LAB — POCT CARDIAC MARKERS
CKMB, poc: 4.4 ng/mL (ref 1.0–8.0)
CKMB, poc: 4 ng/mL (ref 1.0–8.0)
Myoglobin, poc: 183 ng/mL (ref 12–200)
Troponin i, poc: <0.05 ng/mL (ref 0.00–0.09)

## 2010-02-27 LAB — DIFFERENTIAL
Basophils Absolute: 0 10*3/uL (ref 0.0–0.1)
Eosinophils Absolute: 0.4 10*3/uL (ref 0.0–0.7)
Lymphs Abs: 1.8 10*3/uL (ref 0.7–4.0)
Neutro Abs: 5.5 10*3/uL (ref 1.7–7.7)

## 2010-02-27 LAB — COMPREHENSIVE METABOLIC PANEL
ALT: 49 U/L (ref 0–53)
AST: 49 U/L — ABNORMAL HIGH (ref 0–37)
Albumin: 3.3 g/dL — ABNORMAL LOW (ref 3.5–5.2)
Alkaline Phosphatase: 50 U/L (ref 39–117)
Chloride: 107 mEq/L (ref 96–112)
GFR calc Af Amer: >60 mL/min (ref >60)
Potassium: 3.9 mEq/L (ref 3.5–5.1)
Sodium: 133 mEq/L — ABNORMAL LOW (ref 135–145)
Total Bilirubin: 0.8 mg/dL (ref 0.3–1.2)

## 2010-02-27 LAB — CBC
MCV: 88.4 fL (ref 78.0–100.0)
Platelets: 179 10*3/uL (ref 150–400)
RBC: 4.58 MIL/uL (ref 4.22–5.81)
WBC: 8.4 10*3/uL (ref 4.0–10.5)

## 2010-03-01 NOTE — Letter (Signed)
Summary: Device-Delinquent Phone Journalist, newspaper, Main Office  [address]   [place], [place] [address]   Phone: [phone]  Fax: [phone]     February 21, 2010 MRN: [id number]   [facility] [address] [place], [place]  [address]   Dear [name],  According to our records, you were scheduled for a device phone transmission on 02-17-2010.     We did not receive any results from this check.  If you transmitted on your scheduled day, please call us to help troubleshoot your system.  If you forgot to send your transmission, please send one upon receipt of this letter.  Thank you,   [facility]

## 2010-03-14 LAB — BASIC METABOLIC PANEL
BUN: 20 mg/dL (ref 6–23)
BUN: 27 mg/dL — ABNORMAL HIGH (ref 6–23)
BUN: 34 mg/dL — ABNORMAL HIGH (ref 6–23)
CO2: 26 mEq/L (ref 19–32)
CO2: 27 mEq/L (ref 19–32)
Calcium: 9.1 mg/dL (ref 8.4–10.5)
Calcium: 9.3 mg/dL (ref 8.4–10.5)
Calcium: 9.6 mg/dL (ref 8.4–10.5)
Chloride: 102 mEq/L (ref 96–112)
Chloride: 104 mEq/L (ref 96–112)
Chloride: 106 mEq/L (ref 96–112)
Chloride: 106 mEq/L (ref 96–112)
Creatinine, Ser: 1.54 mg/dL — ABNORMAL HIGH (ref 0.4–1.5)
Creatinine, Ser: 1.71 mg/dL — ABNORMAL HIGH (ref 0.4–1.5)
Creatinine, Ser: 1.82 mg/dL — ABNORMAL HIGH (ref 0.4–1.5)
GFR calc Af Amer: 46 mL/min — ABNORMAL LOW (ref >60)
GFR calc Af Amer: 50 mL/min — ABNORMAL LOW (ref >60)
GFR calc Af Amer: 50 mL/min — ABNORMAL LOW (ref >60)
GFR calc Af Amer: 56 mL/min — ABNORMAL LOW (ref >60)
GFR calc non Af Amer: 29 mL/min — ABNORMAL LOW (ref >60)
GFR calc non Af Amer: 46 mL/min — ABNORMAL LOW (ref >60)
GFR calc non Af Amer: 48 mL/min — ABNORMAL LOW (ref >60)
Glucose, Bld: 110 mg/dL — ABNORMAL HIGH (ref 70–99)
Glucose, Bld: 92 mg/dL (ref 70–99)
Glucose, Bld: 99 mg/dL (ref 70–99)
Potassium: 3.8 mEq/L (ref 3.5–5.1)
Potassium: 3.9 mEq/L (ref 3.5–5.1)
Potassium: 4.4 mEq/L (ref 3.5–5.1)
Potassium: 4.6 mEq/L (ref 3.5–5.1)
Sodium: 137 mEq/L (ref 135–145)
Sodium: 138 mEq/L (ref 135–145)
Sodium: 139 mEq/L (ref 135–145)
Sodium: 140 mEq/L (ref 135–145)

## 2010-03-14 LAB — URINE MICROSCOPIC-ADD ON

## 2010-03-14 LAB — COMPREHENSIVE METABOLIC PANEL
ALT: 38 U/L (ref 0–53)
BUN: 18 mg/dL (ref 6–23)
CO2: 25 mEq/L (ref 19–32)
Calcium: 9.3 mg/dL (ref 8.4–10.5)
Creatinine, Ser: 1.45 mg/dL (ref 0.4–1.5)
GFR calc non Af Amer: 50 mL/min — ABNORMAL LOW (ref >60)
Glucose, Bld: 104 mg/dL — ABNORMAL HIGH (ref 70–99)

## 2010-03-14 LAB — PROTIME-INR
INR: 1.04 (ref 0.00–1.49)
INR: 1.04 (ref 0.00–1.49)
INR: 1.96 — ABNORMAL HIGH (ref 0.00–1.49)
INR: 2.08 — ABNORMAL HIGH (ref 0.00–1.49)
Prothrombin Time: 13.8 seconds (ref 11.6–15.2)
Prothrombin Time: 17.4 seconds — ABNORMAL HIGH (ref 11.6–15.2)
Prothrombin Time: 25.4 seconds — ABNORMAL HIGH (ref 11.6–15.2)

## 2010-03-14 LAB — URINALYSIS, ROUTINE W REFLEX MICROSCOPIC
Bilirubin Urine: NEGATIVE
Glucose, UA: NEGATIVE mg/dL
Hgb urine dipstick: NEGATIVE
Ketones, ur: NEGATIVE mg/dL
pH: 6 (ref 5.0–8.0)

## 2010-03-14 LAB — MAGNESIUM: Magnesium: 2.3 mg/dL (ref 1.5–2.5)

## 2010-03-14 LAB — POCT CARDIAC MARKERS
CKMB, poc: 9.5 ng/mL (ref 1.0–8.0)
Troponin i, poc: <0.05 ng/mL (ref 0.00–0.09)

## 2010-03-14 LAB — CBC
HCT: 40.8 % (ref 39.0–52.0)
HCT: 41.5 % (ref 39.0–52.0)
HCT: 42.6 % (ref 39.0–52.0)
HCT: 44.6 % (ref 39.0–52.0)
Hemoglobin: 13.8 g/dL (ref 13.0–17.0)
Hemoglobin: 14 g/dL (ref 13.0–17.0)
Hemoglobin: 14.4 g/dL (ref 13.0–17.0)
Hemoglobin: 14.5 g/dL (ref 13.0–17.0)
MCH: 30.6 pg (ref 26.0–34.0)
MCH: 30.8 pg (ref 26.0–34.0)
MCH: 30.9 pg (ref 26.0–34.0)
MCHC: 33.7 g/dL (ref 30.0–36.0)
MCHC: 33.8 g/dL (ref 30.0–36.0)
MCHC: 34.1 g/dL (ref 30.0–36.0)
MCHC: 34.3 g/dL (ref 30.0–36.0)
MCV: 89.7 fL (ref 78.0–100.0)
MCV: 90.1 fL (ref 78.0–100.0)
MCV: 90.5 fL (ref 78.0–100.0)
MCV: 91.4 fL (ref 78.0–100.0)
Platelets: 182 10*3/uL (ref 150–400)
Platelets: 183 10*3/uL (ref 150–400)
Platelets: 184 10*3/uL (ref 150–400)
RBC: 4.57 MIL/uL (ref 4.22–5.81)
RBC: 4.68 MIL/uL (ref 4.22–5.81)
RBC: 4.75 MIL/uL (ref 4.22–5.81)
RDW: 12.9 % (ref 11.5–15.5)
RDW: 13.3 % (ref 11.5–15.5)
WBC: 4.5 10*3/uL (ref 4.0–10.5)
WBC: 4.9 10*3/uL (ref 4.0–10.5)

## 2010-03-14 LAB — DIFFERENTIAL
Basophils Relative: 0 % (ref 0–1)
Monocytes Relative: 13 % — ABNORMAL HIGH (ref 3–12)
Neutro Abs: 2.9 10*3/uL (ref 1.7–7.7)
Neutrophils Relative %: 46 % (ref 43–77)

## 2010-03-14 LAB — URINE CULTURE
Culture  Setup Time: 201112091852
Special Requests: NEGATIVE

## 2010-03-14 LAB — DIGOXIN LEVEL
Digoxin Level: 0.3 ng/mL — ABNORMAL LOW (ref 0.8–2.0)
Digoxin Level: <0.2 ng/mL — ABNORMAL LOW (ref 0.8–2.0)

## 2010-03-14 LAB — BRAIN NATRIURETIC PEPTIDE
Pro B Natriuretic peptide (BNP): 1623 pg/mL — ABNORMAL HIGH (ref 0.0–100.0)
Pro B Natriuretic peptide (BNP): 67 pg/mL (ref 0.0–100.0)

## 2010-03-14 LAB — CARDIAC PANEL(CRET KIN+CKTOT+MB+TROPI)
CK, MB: 11.4 ng/mL (ref 0.3–4.0)
Total CK: 629 U/L — ABNORMAL HIGH (ref 7–232)

## 2010-03-14 LAB — LIPID PANEL
Cholesterol: 209 mg/dL — ABNORMAL HIGH (ref 0–200)
HDL: 85 mg/dL (ref >39)

## 2010-03-14 LAB — ANTI-NUCLEAR AB-TITER (ANA TITER): ANA Titer 1: 1:320 {titer} — ABNORMAL HIGH

## 2010-03-14 LAB — POCT I-STAT, CHEM 8
Calcium, Ion: 1.21 mmol/L (ref 1.12–1.32)
Creatinine, Ser: 1.6 mg/dL — ABNORMAL HIGH (ref 0.4–1.5)
Glucose, Bld: 109 mg/dL — ABNORMAL HIGH (ref 70–99)
Potassium: 3.4 mEq/L — ABNORMAL LOW (ref 3.5–5.1)
TCO2: 23 mmol/L (ref 0–100)

## 2010-03-17 LAB — CBC
HCT: 46.3 % (ref 39.0–52.0)
HCT: 43.2 % (ref 39.0–52.0)
HCT: 43.8 % (ref 39.0–52.0)
HCT: 44.7 % (ref 39.0–52.0)
HCT: 45 % (ref 39.0–52.0)
HCT: 46.1 % (ref 39.0–52.0)
Hemoglobin: 16.1 g/dL (ref 13.0–17.0)
Hemoglobin: 15.5 g/dL (ref 13.0–17.0)
Hemoglobin: 15.7 g/dL (ref 13.0–17.0)
Hemoglobin: 15.8 g/dL (ref 13.0–17.0)
MCH: 31.8 pg (ref 26.0–34.0)
MCH: 31.2 pg (ref 26.0–34.0)
MCH: 31.7 pg (ref 26.0–34.0)
MCH: 31.7 pg (ref 26.0–34.0)
MCHC: 34 g/dL (ref 30.0–36.0)
MCHC: 34.4 g/dL (ref 30.0–36.0)
MCHC: 34.7 g/dL (ref 30.0–36.0)
MCHC: 35.4 g/dL (ref 30.0–36.0)
MCV: 91.4 fL (ref 78.0–100.0)
MCV: 90.7 fL (ref 78.0–100.0)
MCV: 91.4 fL (ref 78.0–100.0)
Platelets: 228 10*3/uL (ref 150–400)
Platelets: 188 10*3/uL (ref 150–400)
Platelets: 200 10*3/uL (ref 150–400)
Platelets: 203 10*3/uL (ref 150–400)
Platelets: 207 10*3/uL (ref 150–400)
Platelets: 214 10*3/uL (ref 150–400)
Platelets: 223 10*3/uL (ref 150–400)
RBC: 5.07 MIL/uL (ref 4.22–5.81)
RBC: 4.72 MIL/uL (ref 4.22–5.81)
RBC: 4.88 MIL/uL (ref 4.22–5.81)
RBC: 4.95 MIL/uL (ref 4.22–5.81)
RBC: 5.02 MIL/uL (ref 4.22–5.81)
RDW: 12.7 % (ref 11.5–15.5)
RDW: 12.9 % (ref 11.5–15.5)
RDW: 13 % (ref 11.5–15.5)
RDW: 13.1 % (ref 11.5–15.5)
RDW: 13.1 % (ref 11.5–15.5)
RDW: 13.1 % (ref 11.5–15.5)
WBC: 5.9 10*3/uL (ref 4.0–10.5)
WBC: 5 10*3/uL (ref 4.0–10.5)
WBC: 5.1 10*3/uL (ref 4.0–10.5)
WBC: 5.1 10*3/uL (ref 4.0–10.5)
WBC: 5.2 10*3/uL (ref 4.0–10.5)
WBC: 5.3 10*3/uL (ref 4.0–10.5)

## 2010-03-17 LAB — PROTIME-INR
INR: 0.98 (ref 0.00–1.49)
INR: 1.04 (ref 0.00–1.49)
INR: 1.25 (ref 0.00–1.49)
INR: 1.36 (ref 0.00–1.49)
INR: 1.97 — ABNORMAL HIGH (ref 0.00–1.49)
Prothrombin Time: 13.2 seconds (ref 11.6–15.2)
Prothrombin Time: 13.8 seconds (ref 11.6–15.2)
Prothrombin Time: 14.4 seconds (ref 11.6–15.2)
Prothrombin Time: 19.6 seconds — ABNORMAL HIGH (ref 11.6–15.2)
Prothrombin Time: 23.1 seconds — ABNORMAL HIGH (ref 11.6–15.2)

## 2010-03-17 LAB — POCT CARDIAC MARKERS
CKMB, poc: 4.2 ng/mL (ref 1.0–8.0)
Myoglobin, poc: 164 ng/mL (ref 12–200)
Myoglobin, poc: 270 ng/mL (ref 12–200)

## 2010-03-17 LAB — COMPREHENSIVE METABOLIC PANEL
Albumin: 3.8 g/dL (ref 3.5–5.2)
Alkaline Phosphatase: 58 U/L (ref 39–117)
BUN: 28 mg/dL — ABNORMAL HIGH (ref 6–23)
CO2: 22 mEq/L (ref 19–32)
Chloride: 110 mEq/L (ref 96–112)
Creatinine, Ser: 1.69 mg/dL — ABNORMAL HIGH (ref 0.4–1.5)
GFR calc non Af Amer: 42 mL/min — ABNORMAL LOW (ref >60)
Glucose, Bld: 127 mg/dL — ABNORMAL HIGH (ref 70–99)
Potassium: 5 mEq/L (ref 3.5–5.1)
Total Bilirubin: 1 mg/dL (ref 0.3–1.2)

## 2010-03-17 LAB — BASIC METABOLIC PANEL
BUN: 23 mg/dL (ref 6–23)
BUN: 27 mg/dL — ABNORMAL HIGH (ref 6–23)
BUN: 27 mg/dL — ABNORMAL HIGH (ref 6–23)
CO2: 31 mEq/L (ref 19–32)
Calcium: 9.2 mg/dL (ref 8.4–10.5)
Calcium: 9.3 mg/dL (ref 8.4–10.5)
Calcium: 9.8 mg/dL (ref 8.4–10.5)
Chloride: 103 mEq/L (ref 96–112)
Creatinine, Ser: 1.74 mg/dL — ABNORMAL HIGH (ref 0.4–1.5)
Creatinine, Ser: 2.18 mg/dL — ABNORMAL HIGH (ref 0.4–1.5)
GFR calc Af Amer: 38 mL/min — ABNORMAL LOW (ref >60)
GFR calc Af Amer: 49 mL/min — ABNORMAL LOW (ref >60)
GFR calc non Af Amer: 31 mL/min — ABNORMAL LOW (ref >60)
GFR calc non Af Amer: 33 mL/min — ABNORMAL LOW (ref >60)
GFR calc non Af Amer: 33 mL/min — ABNORMAL LOW (ref >60)
GFR calc non Af Amer: 39 mL/min — ABNORMAL LOW (ref >60)
GFR calc non Af Amer: 40 mL/min — ABNORMAL LOW (ref >60)
Glucose, Bld: 112 mg/dL — ABNORMAL HIGH (ref 70–99)
Glucose, Bld: 96 mg/dL (ref 70–99)
Potassium: 3.8 mEq/L (ref 3.5–5.1)
Potassium: 4.2 mEq/L (ref 3.5–5.1)
Sodium: 138 mEq/L (ref 135–145)

## 2010-03-17 LAB — DIFFERENTIAL
Basophils Absolute: 0 10*3/uL (ref 0.0–0.1)
Basophils Absolute: 0 10*3/uL (ref 0.0–0.1)
Basophils Relative: 0 % (ref 0–1)
Basophils Relative: 1 % (ref 0–1)
Eosinophils Relative: 9 % — ABNORMAL HIGH (ref 0–5)
Monocytes Absolute: 0.8 10*3/uL (ref 0.1–1.0)
Neutro Abs: 2.5 10*3/uL (ref 1.7–7.7)
Neutro Abs: 2 10*3/uL (ref 1.7–7.7)
Neutrophils Relative %: 35 % — ABNORMAL LOW (ref 43–77)

## 2010-03-17 LAB — LIPID PANEL
Cholesterol: 184 mg/dL (ref 0–200)
HDL: 75 mg/dL (ref >39)
Total CHOL/HDL Ratio: 2.5 RATIO

## 2010-03-17 LAB — HEPARIN LEVEL (UNFRACTIONATED)
Heparin Unfractionated: 0.23 IU/mL — ABNORMAL LOW (ref 0.30–0.70)
Heparin Unfractionated: 0.33 IU/mL (ref 0.30–0.70)
Heparin Unfractionated: 0.34 IU/mL (ref 0.30–0.70)
Heparin Unfractionated: 0.35 IU/mL (ref 0.30–0.70)
Heparin Unfractionated: 0.4 IU/mL (ref 0.30–0.70)
Heparin Unfractionated: 0.48 IU/mL (ref 0.30–0.70)
Heparin Unfractionated: 0.52 IU/mL (ref 0.30–0.70)
Heparin Unfractionated: 0.55 IU/mL (ref 0.30–0.70)

## 2010-03-17 LAB — BRAIN NATRIURETIC PEPTIDE
Pro B Natriuretic peptide (BNP): 664 pg/mL — ABNORMAL HIGH (ref 0.0–100.0)
Pro B Natriuretic peptide (BNP): 352 pg/mL — ABNORMAL HIGH (ref 0.0–100.0)

## 2010-03-17 LAB — CK TOTAL AND CKMB (NOT AT ARMC): Total CK: 342 U/L — ABNORMAL HIGH (ref 7–232)

## 2010-03-17 LAB — CARDIAC PANEL(CRET KIN+CKTOT+MB+TROPI)
Relative Index: 2.8 — ABNORMAL HIGH (ref 0.0–2.5)
Total CK: 265 U/L — ABNORMAL HIGH (ref 7–232)
Troponin I: 0.04 ng/mL (ref 0.00–0.06)
Troponin I: 0.04 ng/mL (ref 0.00–0.06)

## 2010-03-20 LAB — CBC
HCT: 43.7 % (ref 39.0–52.0)
HCT: 40.6 % (ref 39.0–52.0)
HCT: 40.6 % (ref 39.0–52.0)
HCT: 42 % (ref 39.0–52.0)
HCT: 43 % (ref 39.0–52.0)
HCT: 44.3 % (ref 39.0–52.0)
Hemoglobin: 15.3 g/dL (ref 13.0–17.0)
Hemoglobin: 13.5 g/dL (ref 13.0–17.0)
Hemoglobin: 13.7 g/dL (ref 13.0–17.0)
Hemoglobin: 14.1 g/dL (ref 13.0–17.0)
Hemoglobin: 14.3 g/dL (ref 13.0–17.0)
Hemoglobin: 14.6 g/dL (ref 13.0–17.0)
MCH: 31.9 pg (ref 26.0–34.0)
MCHC: 35 g/dL (ref 30.0–36.0)
MCHC: 32.9 g/dL (ref 30.0–36.0)
MCHC: 33.3 g/dL (ref 30.0–36.0)
MCHC: 33.4 g/dL (ref 30.0–36.0)
MCHC: 33.5 g/dL (ref 30.0–36.0)
MCHC: 33.7 g/dL (ref 30.0–36.0)
MCV: 91.1 fL (ref 78.0–100.0)
MCV: 90.2 fL (ref 78.0–100.0)
MCV: 90.3 fL (ref 78.0–100.0)
MCV: 92.4 fL (ref 78.0–100.0)
MCV: 92.7 fL (ref 78.0–100.0)
MCV: 93.2 fL (ref 78.0–100.0)
MCV: 93.5 fL (ref 78.0–100.0)
Platelets: 174 10*3/uL (ref 150–400)
Platelets: 183 10*3/uL (ref 150–400)
Platelets: 184 10*3/uL (ref 150–400)
Platelets: 209 10*3/uL (ref 150–400)
Platelets: 224 10*3/uL (ref 150–400)
RBC: 4.12 MIL/uL — ABNORMAL LOW (ref 4.22–5.81)
RBC: 4.21 MIL/uL — ABNORMAL LOW (ref 4.22–5.81)
RBC: 4.41 MIL/uL (ref 4.22–5.81)
RBC: 4.55 MIL/uL (ref 4.22–5.81)
RBC: 4.61 MIL/uL (ref 4.22–5.81)
RBC: 4.76 MIL/uL (ref 4.22–5.81)
RDW: 13.2 % (ref 11.5–15.5)
RDW: 13.3 % (ref 11.5–15.5)
RDW: 13.5 % (ref 11.5–15.5)
RDW: 13.6 % (ref 11.5–15.5)
RDW: 13.6 % (ref 11.5–15.5)
RDW: 13.7 % (ref 11.5–15.5)
RDW: 13.8 % (ref 11.5–15.5)
WBC: 5.2 10*3/uL (ref 4.0–10.5)
WBC: 5.4 10*3/uL (ref 4.0–10.5)
WBC: 5.5 10*3/uL (ref 4.0–10.5)
WBC: 6.1 10*3/uL (ref 4.0–10.5)
WBC: 6.4 10*3/uL (ref 4.0–10.5)
WBC: 6.5 10*3/uL (ref 4.0–10.5)

## 2010-03-20 LAB — DIFFERENTIAL
Basophils Absolute: 0.1 10*3/uL (ref 0.0–0.1)
Basophils Relative: 2 % — ABNORMAL HIGH (ref 0–1)
Basophils Relative: 1 % (ref 0–1)
Eosinophils Absolute: 0.3 10*3/uL (ref 0.0–0.7)
Eosinophils Relative: 6 % — ABNORMAL HIGH (ref 0–5)
Eosinophils Relative: 5 % (ref 0–5)
Monocytes Absolute: 0.8 10*3/uL (ref 0.1–1.0)
Monocytes Absolute: 0.6 10*3/uL (ref 0.1–1.0)
Monocytes Relative: 12 % (ref 3–12)
Monocytes Relative: 10 % (ref 3–12)
Neutro Abs: 1.9 10*3/uL (ref 1.7–7.7)
Neutro Abs: 3.3 10*3/uL (ref 1.7–7.7)

## 2010-03-20 LAB — BASIC METABOLIC PANEL
BUN: 29 mg/dL — ABNORMAL HIGH (ref 6–23)
BUN: 21 mg/dL (ref 6–23)
BUN: 29 mg/dL — ABNORMAL HIGH (ref 6–23)
CO2: 22 mEq/L (ref 19–32)
CO2: 24 mEq/L (ref 19–32)
CO2: 25 mEq/L (ref 19–32)
CO2: 25 mEq/L (ref 19–32)
Calcium: 8.9 mg/dL (ref 8.4–10.5)
Calcium: 9.3 mg/dL (ref 8.4–10.5)
Chloride: 109 mEq/L (ref 96–112)
Chloride: 107 mEq/L (ref 96–112)
Chloride: 111 mEq/L (ref 96–112)
Creatinine, Ser: 1.57 mg/dL — ABNORMAL HIGH (ref 0.4–1.5)
Creatinine, Ser: 1.67 mg/dL — ABNORMAL HIGH (ref 0.4–1.5)
Creatinine, Ser: 1.67 mg/dL — ABNORMAL HIGH (ref 0.4–1.5)
GFR calc Af Amer: 48 mL/min — ABNORMAL LOW (ref >60)
GFR calc Af Amer: 51 mL/min — ABNORMAL LOW (ref >60)
GFR calc Af Amer: 55 mL/min — ABNORMAL LOW (ref >60)
GFR calc non Af Amer: 30 mL/min — ABNORMAL LOW (ref >60)
GFR calc non Af Amer: 39 mL/min — ABNORMAL LOW (ref >60)
Glucose, Bld: 124 mg/dL — ABNORMAL HIGH (ref 70–99)
Glucose, Bld: 104 mg/dL — ABNORMAL HIGH (ref 70–99)
Glucose, Bld: 111 mg/dL — ABNORMAL HIGH (ref 70–99)
Glucose, Bld: 99 mg/dL (ref 70–99)
Potassium: 3.7 mEq/L (ref 3.5–5.1)
Potassium: 4 mEq/L (ref 3.5–5.1)
Potassium: 4.6 mEq/L (ref 3.5–5.1)
Sodium: 139 mEq/L (ref 135–145)
Sodium: 141 mEq/L (ref 135–145)
Sodium: 141 mEq/L (ref 135–145)

## 2010-03-20 LAB — COMPREHENSIVE METABOLIC PANEL
ALT: 53 U/L (ref 0–53)
AST: 44 U/L — ABNORMAL HIGH (ref 0–37)
Calcium: 9 mg/dL (ref 8.4–10.5)
GFR calc Af Amer: 51 mL/min — ABNORMAL LOW (ref >60)
Sodium: 140 mEq/L (ref 135–145)
Total Protein: 6.7 g/dL (ref 6.0–8.3)

## 2010-03-20 LAB — POCT CARDIAC MARKERS
CKMB, poc: 2.3 ng/mL (ref 1.0–8.0)
CKMB, poc: 4.3 ng/mL (ref 1.0–8.0)
Myoglobin, poc: 168 ng/mL (ref 12–200)
Myoglobin, poc: 237 ng/mL (ref 12–200)
Myoglobin, poc: 368 ng/mL (ref 12–200)
Troponin i, poc: <0.05 ng/mL (ref 0.00–0.09)

## 2010-03-20 LAB — PROTIME-INR
INR: 1.93 — ABNORMAL HIGH (ref 0.00–1.49)
INR: 1.96 — ABNORMAL HIGH (ref 0.00–1.49)
INR: 2.12 — ABNORMAL HIGH (ref 0.00–1.49)
INR: 2.18 — ABNORMAL HIGH (ref 0.00–1.49)
Prothrombin Time: 14.3 seconds (ref 11.6–15.2)
Prothrombin Time: 23.6 seconds — ABNORMAL HIGH (ref 11.6–15.2)
Prothrombin Time: 24.1 seconds — ABNORMAL HIGH (ref 11.6–15.2)

## 2010-03-20 LAB — LIPID PANEL
HDL: 67 mg/dL (ref >39)
Total CHOL/HDL Ratio: 2.6 RATIO
Triglycerides: 76 mg/dL (ref <150)
VLDL: 15 mg/dL (ref 0–40)

## 2010-03-20 LAB — CARDIAC PANEL(CRET KIN+CKTOT+MB+TROPI)
CK, MB: 2.1 ng/mL (ref 0.3–4.0)
Total CK: 110 U/L (ref 7–232)

## 2010-03-20 LAB — URINALYSIS, ROUTINE W REFLEX MICROSCOPIC
Glucose, UA: NEGATIVE mg/dL
Leukocytes, UA: NEGATIVE
Protein, ur: 100 mg/dL — AB
Specific Gravity, Urine: 1.017 (ref 1.005–1.030)
Urobilinogen, UA: 1 mg/dL (ref 0.0–1.0)

## 2010-03-20 LAB — MAGNESIUM: Magnesium: 2.1 mg/dL (ref 1.5–2.5)

## 2010-03-20 LAB — HEPARIN LEVEL (UNFRACTIONATED)
Heparin Unfractionated: 0.41 IU/mL (ref 0.30–0.70)
Heparin Unfractionated: 0.46 IU/mL (ref 0.30–0.70)
Heparin Unfractionated: 0.58 IU/mL (ref 0.30–0.70)

## 2010-03-20 LAB — URINE MICROSCOPIC-ADD ON

## 2010-03-20 LAB — BRAIN NATRIURETIC PEPTIDE: Pro B Natriuretic peptide (BNP): 119 pg/mL — ABNORMAL HIGH (ref 0.0–100.0)

## 2010-03-20 LAB — DIGOXIN LEVEL: Digoxin Level: 0.2 ng/mL — ABNORMAL LOW (ref 0.8–2.0)

## 2010-03-20 LAB — CK TOTAL AND CKMB (NOT AT ARMC): Total CK: 155 U/L (ref 7–232)

## 2010-03-20 LAB — APTT: aPTT: 35 seconds (ref 24–37)

## 2010-03-20 LAB — TROPONIN I: Troponin I: 0.05 ng/mL (ref 0.00–0.06)

## 2010-03-21 LAB — LIPID PANEL
Cholesterol: 210 mg/dL — ABNORMAL HIGH (ref 0–200)
HDL: 98 mg/dL (ref >39)
LDL Cholesterol: 95 mg/dL (ref 0–99)
Triglycerides: 83 mg/dL (ref <150)

## 2010-03-21 LAB — BRAIN NATRIURETIC PEPTIDE
Pro B Natriuretic peptide (BNP): 108 pg/mL — ABNORMAL HIGH (ref 0.0–100.0)
Pro B Natriuretic peptide (BNP): 806 pg/mL — ABNORMAL HIGH (ref 0.0–100.0)
Pro B Natriuretic peptide (BNP): 960 pg/mL — ABNORMAL HIGH (ref 0.0–100.0)

## 2010-03-21 LAB — BASIC METABOLIC PANEL
BUN: 22 mg/dL (ref 6–23)
BUN: 45 mg/dL — ABNORMAL HIGH (ref 6–23)
BUN: 54 mg/dL — ABNORMAL HIGH (ref 6–23)
BUN: 61 mg/dL — ABNORMAL HIGH (ref 6–23)
CO2: 24 mEq/L (ref 19–32)
CO2: 26 mEq/L (ref 19–32)
CO2: 27 mEq/L (ref 19–32)
Calcium: 10 mg/dL (ref 8.4–10.5)
Calcium: 10 mg/dL (ref 8.4–10.5)
Calcium: 9.8 mg/dL (ref 8.4–10.5)
Chloride: 102 mEq/L (ref 96–112)
Chloride: 103 mEq/L (ref 96–112)
Chloride: 104 mEq/L (ref 96–112)
Chloride: 106 mEq/L (ref 96–112)
Creatinine, Ser: 1.64 mg/dL — ABNORMAL HIGH (ref 0.4–1.5)
Creatinine, Ser: 2 mg/dL — ABNORMAL HIGH (ref 0.4–1.5)
Creatinine, Ser: 2 mg/dL — ABNORMAL HIGH (ref 0.4–1.5)
Creatinine, Ser: 2.44 mg/dL — ABNORMAL HIGH (ref 0.4–1.5)
GFR calc Af Amer: 38 mL/min — ABNORMAL LOW (ref >60)
GFR calc Af Amer: 42 mL/min — ABNORMAL LOW (ref >60)
GFR calc Af Amer: 51 mL/min — ABNORMAL LOW (ref >60)
GFR calc non Af Amer: 27 mL/min — ABNORMAL LOW (ref >60)
GFR calc non Af Amer: 30 mL/min — ABNORMAL LOW (ref >60)
GFR calc non Af Amer: 31 mL/min — ABNORMAL LOW (ref >60)
GFR calc non Af Amer: 42 mL/min — ABNORMAL LOW (ref >60)
GFR calc non Af Amer: 43 mL/min — ABNORMAL LOW (ref >60)
Glucose, Bld: 104 mg/dL — ABNORMAL HIGH (ref 70–99)
Glucose, Bld: 105 mg/dL — ABNORMAL HIGH (ref 70–99)
Glucose, Bld: 135 mg/dL — ABNORMAL HIGH (ref 70–99)
Glucose, Bld: 97 mg/dL (ref 70–99)
Potassium: 3.7 mEq/L (ref 3.5–5.1)
Potassium: 3.8 mEq/L (ref 3.5–5.1)
Potassium: 4.5 mEq/L (ref 3.5–5.1)
Potassium: 4.8 mEq/L (ref 3.5–5.1)
Sodium: 138 mEq/L (ref 135–145)
Sodium: 140 mEq/L (ref 135–145)
Sodium: 140 mEq/L (ref 135–145)
Sodium: 141 mEq/L (ref 135–145)

## 2010-03-21 LAB — CBC
HCT: 39.7 % (ref 39.0–52.0)
HCT: 45.5 % (ref 39.0–52.0)
HCT: 46.5 % (ref 39.0–52.0)
Hemoglobin: 15.4 g/dL (ref 13.0–17.0)
Hemoglobin: 16.9 g/dL (ref 13.0–17.0)
MCHC: 33.2 g/dL (ref 30.0–36.0)
MCHC: 34 g/dL (ref 30.0–36.0)
MCHC: 34.2 g/dL (ref 30.0–36.0)
MCHC: 34.2 g/dL (ref 30.0–36.0)
MCV: 92 fL (ref 78.0–100.0)
MCV: 92 fL (ref 78.0–100.0)
MCV: 92.2 fL (ref 78.0–100.0)
Platelets: 195 10*3/uL (ref 150–400)
Platelets: 199 10*3/uL (ref 150–400)
Platelets: 213 10*3/uL (ref 150–400)
Platelets: 223 10*3/uL (ref 150–400)
RBC: 4.93 MIL/uL (ref 4.22–5.81)
RBC: 5.19 MIL/uL (ref 4.22–5.81)
RBC: 5.47 MIL/uL (ref 4.22–5.81)
RDW: 13 % (ref 11.5–15.5)
RDW: 13.2 % (ref 11.5–15.5)
RDW: 13.2 % (ref 11.5–15.5)
RDW: 13.4 % (ref 11.5–15.5)
WBC: 5.4 10*3/uL (ref 4.0–10.5)
WBC: 5.6 10*3/uL (ref 4.0–10.5)
WBC: 6.6 10*3/uL (ref 4.0–10.5)

## 2010-03-21 LAB — CARDIAC PANEL(CRET KIN+CKTOT+MB+TROPI)
CK, MB: 2.5 ng/mL (ref 0.3–4.0)
Relative Index: 2.9 — ABNORMAL HIGH (ref 0.0–2.5)
Total CK: 110 U/L (ref 7–232)
Troponin I: 0.04 ng/mL (ref 0.00–0.06)

## 2010-03-21 LAB — DIFFERENTIAL
Basophils Absolute: 0 10*3/uL (ref 0.0–0.1)
Eosinophils Absolute: 0.3 10*3/uL (ref 0.0–0.7)
Eosinophils Relative: 6 % — ABNORMAL HIGH (ref 0–5)
Lymphocytes Relative: 41 % (ref 12–46)
Lymphs Abs: 2.2 10*3/uL (ref 0.7–4.0)
Neutrophils Relative %: 43 % (ref 43–77)

## 2010-03-21 LAB — PROTIME-INR
INR: 1 (ref 0.00–1.49)
INR: 1.81 — ABNORMAL HIGH (ref 0.00–1.49)
INR: 1.95 — ABNORMAL HIGH (ref 0.00–1.49)
INR: 1.95 — ABNORMAL HIGH (ref 0.00–1.49)
Prothrombin Time: 13.1 seconds (ref 11.6–15.2)
Prothrombin Time: 13.7 seconds (ref 11.6–15.2)
Prothrombin Time: 20.8 seconds — ABNORMAL HIGH (ref 11.6–15.2)
Prothrombin Time: 22.1 seconds — ABNORMAL HIGH (ref 11.6–15.2)

## 2010-03-21 LAB — HEPARIN LEVEL (UNFRACTIONATED)
Heparin Unfractionated: 0.39 IU/mL (ref 0.30–0.70)
Heparin Unfractionated: 0.43 IU/mL (ref 0.30–0.70)
Heparin Unfractionated: 0.63 IU/mL (ref 0.30–0.70)
Heparin Unfractionated: 0.64 IU/mL (ref 0.30–0.70)
Heparin Unfractionated: 0.77 IU/mL — ABNORMAL HIGH (ref 0.30–0.70)
Heparin Unfractionated: 0.92 IU/mL — ABNORMAL HIGH (ref 0.30–0.70)

## 2010-03-21 LAB — POCT CARDIAC MARKERS
CKMB, poc: 3.6 ng/mL (ref 1.0–8.0)
Myoglobin, poc: 108 ng/mL (ref 12–200)
Troponin i, poc: <0.05 ng/mL (ref 0.00–0.09)

## 2010-03-25 LAB — CBC
HCT: 44.7 % (ref 39.0–52.0)
HCT: 46.3 % (ref 39.0–52.0)
Hemoglobin: 15.3 g/dL (ref 13.0–17.0)
MCHC: 33.7 g/dL (ref 30.0–36.0)
MCHC: 34 g/dL (ref 30.0–36.0)
MCV: 92.6 fL (ref 78.0–100.0)
MCV: 93.3 fL (ref 78.0–100.0)
MCV: 94.3 fL (ref 78.0–100.0)
MCV: 94.7 fL (ref 78.0–100.0)
Platelets: 180 10*3/uL (ref 150–400)
Platelets: 193 10*3/uL (ref 150–400)
Platelets: 193 10*3/uL (ref 150–400)
Platelets: 215 10*3/uL (ref 150–400)
RBC: 4.73 MIL/uL (ref 4.22–5.81)
RBC: 4.85 MIL/uL (ref 4.22–5.81)
RBC: 4.91 MIL/uL (ref 4.22–5.81)
RDW: 11.8 % (ref 11.5–15.5)
WBC: 4.9 10*3/uL (ref 4.0–10.5)
WBC: 5 10*3/uL (ref 4.0–10.5)
WBC: 5.6 10*3/uL (ref 4.0–10.5)

## 2010-03-25 LAB — BASIC METABOLIC PANEL
BUN: 20 mg/dL (ref 6–23)
CO2: 26 mEq/L (ref 19–32)
CO2: 30 mEq/L (ref 19–32)
CO2: 33 mEq/L — ABNORMAL HIGH (ref 19–32)
Calcium: 9.6 mg/dL (ref 8.4–10.5)
Chloride: 103 mEq/L (ref 96–112)
Chloride: 104 mEq/L (ref 96–112)
Chloride: 105 mEq/L (ref 96–112)
Chloride: 110 mEq/L (ref 96–112)
Creatinine, Ser: 1.58 mg/dL — ABNORMAL HIGH (ref 0.4–1.5)
Creatinine, Ser: 1.96 mg/dL — ABNORMAL HIGH (ref 0.4–1.5)
GFR calc Af Amer: 43 mL/min — ABNORMAL LOW (ref >60)
GFR calc Af Amer: 47 mL/min — ABNORMAL LOW (ref >60)
GFR calc non Af Amer: 33 mL/min — ABNORMAL LOW (ref >60)
Glucose, Bld: 106 mg/dL — ABNORMAL HIGH (ref 70–99)
Glucose, Bld: 98 mg/dL (ref 70–99)
Potassium: 4.3 mEq/L (ref 3.5–5.1)
Potassium: 4.4 mEq/L (ref 3.5–5.1)
Sodium: 138 mEq/L (ref 135–145)
Sodium: 139 mEq/L (ref 135–145)
Sodium: 141 mEq/L (ref 135–145)

## 2010-03-25 LAB — GLUCOSE, CAPILLARY: Glucose-Capillary: 99 mg/dL (ref 70–99)

## 2010-03-25 LAB — POCT CARDIAC MARKERS
Myoglobin, poc: 128 ng/mL (ref 12–200)
Troponin i, poc: <0.05 ng/mL (ref 0.00–0.09)
Troponin i, poc: <0.05 ng/mL (ref 0.00–0.09)

## 2010-03-25 LAB — LIPID PANEL
HDL: 89 mg/dL (ref >39)
Total CHOL/HDL Ratio: 2.3 RATIO
VLDL: 22 mg/dL (ref 0–40)

## 2010-03-25 LAB — CARDIAC PANEL(CRET KIN+CKTOT+MB+TROPI)
CK, MB: 3.9 ng/mL (ref 0.3–4.0)
Relative Index: 2.4 (ref 0.0–2.5)
Relative Index: 2.6 — ABNORMAL HIGH (ref 0.0–2.5)
Total CK: 141 U/L (ref 7–232)
Total CK: 161 U/L (ref 7–232)
Troponin I: 0.02 ng/mL (ref 0.00–0.06)

## 2010-03-25 LAB — DIFFERENTIAL
Basophils Relative: 1 % (ref 0–1)
Eosinophils Absolute: 0.4 10*3/uL (ref 0.0–0.7)
Eosinophils Relative: 8 % — ABNORMAL HIGH (ref 0–5)
Neutrophils Relative %: 33 % — ABNORMAL LOW (ref 43–77)

## 2010-03-25 LAB — PROTIME-INR: Prothrombin Time: 13 seconds (ref 11.6–15.2)

## 2010-03-25 LAB — BRAIN NATRIURETIC PEPTIDE
Pro B Natriuretic peptide (BNP): 331 pg/mL — ABNORMAL HIGH (ref 0.0–100.0)
Pro B Natriuretic peptide (BNP): 428 pg/mL — ABNORMAL HIGH (ref 0.0–100.0)

## 2010-03-25 LAB — TSH: TSH: 2.864 u[IU]/mL (ref 0.350–4.500)

## 2010-03-25 LAB — HEMOGLOBIN A1C: Mean Plasma Glucose: 126 mg/dL

## 2010-03-25 LAB — D-DIMER, QUANTITATIVE: D-Dimer, Quant: <0.22 ug/mL-FEU (ref 0.00–0.48)

## 2010-04-08 ENCOUNTER — Inpatient Hospital Stay (HOSPITAL_COMMUNITY)
Admission: EM | Admit: 2010-04-08 | Discharge: 2010-04-13 | DRG: 292 | Disposition: A | Discharge status: Home / Self Care | Payer: PRIVATE HEALTH INSURANCE | Attending: Cardiology | Admitting: Cardiology

## 2010-04-08 ENCOUNTER — Emergency Department (HOSPITAL_COMMUNITY): Payer: PRIVATE HEALTH INSURANCE

## 2010-04-08 DIAGNOSIS — I428 Other cardiomyopathies: Secondary | ICD-10-CM | POA: Diagnosis present

## 2010-04-08 DIAGNOSIS — K219 Gastro-esophageal reflux disease without esophagitis: Secondary | ICD-10-CM | POA: Diagnosis present

## 2010-04-08 DIAGNOSIS — I509 Heart failure, unspecified: Secondary | ICD-10-CM | POA: Diagnosis present

## 2010-04-08 DIAGNOSIS — Z91199 Patient's noncompliance with other medical treatment and regimen due to unspecified reason: Secondary | ICD-10-CM

## 2010-04-08 DIAGNOSIS — I129 Hypertensive chronic kidney disease with stage 1 through stage 4 chronic kidney disease, or unspecified chronic kidney disease: Secondary | ICD-10-CM | POA: Diagnosis present

## 2010-04-08 DIAGNOSIS — I4891 Unspecified atrial fibrillation: Secondary | ICD-10-CM | POA: Diagnosis present

## 2010-04-08 DIAGNOSIS — E119 Type 2 diabetes mellitus without complications: Secondary | ICD-10-CM | POA: Diagnosis present

## 2010-04-08 DIAGNOSIS — I5023 Acute on chronic systolic (congestive) heart failure: Principal | ICD-10-CM | POA: Diagnosis present

## 2010-04-08 DIAGNOSIS — Z95810 Presence of automatic (implantable) cardiac defibrillator: Secondary | ICD-10-CM

## 2010-04-08 DIAGNOSIS — Z7901 Long term (current) use of anticoagulants: Secondary | ICD-10-CM

## 2010-04-08 DIAGNOSIS — E7800 Pure hypercholesterolemia, unspecified: Secondary | ICD-10-CM | POA: Diagnosis present

## 2010-04-08 DIAGNOSIS — N183 Chronic kidney disease, stage 3 unspecified: Secondary | ICD-10-CM | POA: Diagnosis present

## 2010-04-08 DIAGNOSIS — N289 Disorder of kidney and ureter, unspecified: Secondary | ICD-10-CM | POA: Diagnosis present

## 2010-04-08 DIAGNOSIS — Z9119 Patient's noncompliance with other medical treatment and regimen: Secondary | ICD-10-CM

## 2010-04-08 LAB — BASIC METABOLIC PANEL
BUN: 15 mg/dL (ref 6–23)
CO2: 22 mEq/L (ref 19–32)
Calcium: 9.3 mg/dL (ref 8.4–10.5)
Chloride: 108 mEq/L (ref 96–112)
Creatinine, Ser: 1.66 mg/dL — ABNORMAL HIGH (ref 0.4–1.5)
GFR calc Af Amer: 51 mL/min — ABNORMAL LOW (ref >60)
GFR calc non Af Amer: 42 mL/min — ABNORMAL LOW (ref >60)
Glucose, Bld: 97 mg/dL (ref 70–99)
Potassium: 3.8 mEq/L (ref 3.5–5.1)
Sodium: 137 mEq/L (ref 135–145)

## 2010-04-08 LAB — DIFFERENTIAL
Basophils Absolute: 0 10*3/uL (ref 0.0–0.1)
Eosinophils Relative: 5 % (ref 0–5)
Lymphocytes Relative: 36 % (ref 12–46)
Lymphs Abs: 2.4 10*3/uL (ref 0.7–4.0)
Monocytes Absolute: 0.6 10*3/uL (ref 0.1–1.0)
Monocytes Relative: 10 % (ref 3–12)
Neutro Abs: 3.3 10*3/uL (ref 1.7–7.7)

## 2010-04-08 LAB — CBC
HCT: 41.8 % (ref 39.0–52.0)
Hemoglobin: 14.4 g/dL (ref 13.0–17.0)
MCHC: 34.4 g/dL (ref 30.0–36.0)
MCV: 89.9 fL (ref 78.0–100.0)

## 2010-04-08 LAB — PROTIME-INR
INR: 1.05 (ref 0.00–1.49)
Prothrombin Time: 13.9 seconds (ref 11.6–15.2)

## 2010-04-08 LAB — BRAIN NATRIURETIC PEPTIDE: Pro B Natriuretic peptide (BNP): 1386 pg/mL — ABNORMAL HIGH (ref 0.0–100.0)

## 2010-04-08 LAB — POCT CARDIAC MARKERS: Myoglobin, poc: 153 ng/mL (ref 12–200)

## 2010-04-09 LAB — CARDIAC PANEL(CRET KIN+CKTOT+MB+TROPI)
CK, MB: 3.9 ng/mL (ref 0.3–4.0)
CK, MB: 4.1 ng/mL — ABNORMAL HIGH (ref 0.3–4.0)
Relative Index: 3 — ABNORMAL HIGH (ref 0.0–2.5)
Relative Index: 3.1 — ABNORMAL HIGH (ref 0.0–2.5)
Relative Index: 3.3 — ABNORMAL HIGH (ref 0.0–2.5)
Relative Index: 3.1 — ABNORMAL HIGH (ref 0.0–2.5)
Total CK: 131 U/L (ref 7–232)
Total CK: 121 U/L (ref 7–232)
Total CK: 125 U/L (ref 7–232)
Total CK: 102 U/L (ref 7–232)
Total CK: 180 U/L (ref 7–232)
Total CK: 239 U/L — ABNORMAL HIGH (ref 7–232)
Troponin I: 0.05 ng/mL (ref 0.00–0.06)
Troponin I: 0.08 ng/mL — ABNORMAL HIGH (ref 0.00–0.06)
Troponin I: 0.07 ng/mL — ABNORMAL HIGH (ref 0.00–0.06)
Troponin I: 0.03 ng/mL (ref 0.00–0.06)
Troponin I: 0.03 ng/mL (ref 0.00–0.06)

## 2010-04-09 LAB — URINE CULTURE: Colony Count: NO GROWTH

## 2010-04-09 LAB — BASIC METABOLIC PANEL
BUN: 22 mg/dL (ref 6–23)
BUN: 23 mg/dL (ref 6–23)
CO2: 21 mEq/L (ref 19–32)
CO2: 25 mEq/L (ref 19–32)
CO2: 26 mEq/L (ref 19–32)
CO2: 28 mEq/L (ref 19–32)
Calcium: 9.1 mg/dL (ref 8.4–10.5)
Calcium: 8.8 mg/dL (ref 8.4–10.5)
Chloride: 104 mEq/L (ref 96–112)
Chloride: 106 mEq/L (ref 96–112)
Chloride: 107 mEq/L (ref 96–112)
Creatinine, Ser: 1.91 mg/dL — ABNORMAL HIGH (ref 0.4–1.5)
Creatinine, Ser: 1.53 mg/dL — ABNORMAL HIGH (ref 0.4–1.5)
Creatinine, Ser: 1.67 mg/dL — ABNORMAL HIGH (ref 0.4–1.5)
Creatinine, Ser: 1.73 mg/dL — ABNORMAL HIGH (ref 0.4–1.5)
Creatinine, Ser: 1.86 mg/dL — ABNORMAL HIGH (ref 0.4–1.5)
GFR calc Af Amer: 45 mL/min — ABNORMAL LOW (ref >60)
GFR calc non Af Amer: 38 mL/min — ABNORMAL LOW (ref >60)
Glucose, Bld: 119 mg/dL — ABNORMAL HIGH (ref 70–99)
Glucose, Bld: 103 mg/dL — ABNORMAL HIGH (ref 70–99)
Glucose, Bld: 106 mg/dL — ABNORMAL HIGH (ref 70–99)
Glucose, Bld: 123 mg/dL — ABNORMAL HIGH (ref 70–99)

## 2010-04-09 LAB — PROTIME-INR
INR: 1.07 (ref 0.00–1.49)
Prothrombin Time: 14.1 seconds (ref 11.6–15.2)

## 2010-04-09 LAB — LIPID PANEL: VLDL: 17 mg/dL (ref 0–40)

## 2010-04-09 LAB — CBC
HCT: 43.3 % (ref 39.0–52.0)
Hemoglobin: 15.2 g/dL (ref 13.0–17.0)
MCHC: 34.8 g/dL (ref 30.0–36.0)
MCHC: 34.4 g/dL (ref 30.0–36.0)
MCV: 88.9 fL (ref 78.0–100.0)
MCV: 92.5 fL (ref 78.0–100.0)
Platelets: 194 10*3/uL (ref 150–400)
RDW: 12.7 % (ref 11.5–15.5)
RDW: 12.8 % (ref 11.5–15.5)
WBC: 6.9 10*3/uL (ref 4.0–10.5)
WBC: 4.6 10*3/uL (ref 4.0–10.5)

## 2010-04-09 LAB — TSH: TSH: 2.47 u[IU]/mL (ref 0.350–4.500)

## 2010-04-09 LAB — COMPREHENSIVE METABOLIC PANEL
AST: 58 U/L — ABNORMAL HIGH (ref 0–37)
Albumin: 3.4 g/dL — ABNORMAL LOW (ref 3.5–5.2)
BUN: 15 mg/dL (ref 6–23)
Calcium: 9.4 mg/dL (ref 8.4–10.5)
Chloride: 109 mEq/L (ref 96–112)
Creatinine, Ser: 1.54 mg/dL — ABNORMAL HIGH (ref 0.4–1.5)
GFR calc Af Amer: 56 mL/min — ABNORMAL LOW (ref >60)
Glucose, Bld: 157 mg/dL — ABNORMAL HIGH (ref 70–99)
Total Bilirubin: 0.6 mg/dL (ref 0.3–1.2)
Total Protein: 7.8 g/dL (ref 6.0–8.3)

## 2010-04-09 LAB — URINALYSIS, ROUTINE W REFLEX MICROSCOPIC
Glucose, UA: NEGATIVE mg/dL
Nitrite: NEGATIVE
Protein, ur: NEGATIVE mg/dL
pH: 5.5 (ref 5.0–8.0)

## 2010-04-09 LAB — DIGOXIN LEVEL: Digoxin Level: <0.2 ng/mL — ABNORMAL LOW (ref 0.8–2.0)

## 2010-04-09 LAB — BRAIN NATRIURETIC PEPTIDE
Pro B Natriuretic peptide (BNP): 168 pg/mL — ABNORMAL HIGH (ref 0.0–100.0)
Pro B Natriuretic peptide (BNP): 63 pg/mL (ref 0.0–100.0)

## 2010-04-09 LAB — APTT: aPTT: 32 seconds (ref 24–37)

## 2010-04-10 LAB — BRAIN NATRIURETIC PEPTIDE: Pro B Natriuretic peptide (BNP): 383 pg/mL — ABNORMAL HIGH (ref 0.0–100.0)

## 2010-04-10 LAB — BASIC METABOLIC PANEL
Chloride: 103 mEq/L (ref 96–112)
GFR calc Af Amer: 41 mL/min — ABNORMAL LOW (ref >60)
Potassium: 3.7 mEq/L (ref 3.5–5.1)

## 2010-04-10 LAB — PROTIME-INR
INR: 1.48 (ref 0.00–1.49)
Prothrombin Time: 18.1 seconds — ABNORMAL HIGH (ref 11.6–15.2)

## 2010-04-10 LAB — HEPARIN LEVEL (UNFRACTIONATED): Heparin Unfractionated: 0.47 IU/mL (ref 0.30–0.70)

## 2010-04-11 LAB — CBC
MCH: 31.3 pg (ref 26.0–34.0)
MCV: 89.5 fL (ref 78.0–100.0)
MCV: 90.9 fL (ref 78.0–100.0)
Platelets: 202 10*3/uL (ref 150–400)
Platelets: 195 10*3/uL (ref 150–400)
RDW: 12.7 % (ref 11.5–15.5)
RDW: 13.4 % (ref 11.5–15.5)
WBC: 4.8 10*3/uL (ref 4.0–10.5)

## 2010-04-11 LAB — POCT CARDIAC MARKERS
CKMB, poc: 2.8 ng/mL (ref 1.0–8.0)
Troponin i, poc: <0.05 ng/mL (ref 0.00–0.09)

## 2010-04-11 LAB — DIFFERENTIAL
Eosinophils Relative: 9 % — ABNORMAL HIGH (ref 0–5)
Lymphs Abs: 2.3 10*3/uL (ref 0.7–4.0)
Neutrophils Relative %: 27 % — ABNORMAL LOW (ref 43–77)

## 2010-04-11 LAB — BASIC METABOLIC PANEL
BUN: 24 mg/dL — ABNORMAL HIGH (ref 6–23)
BUN: 19 mg/dL (ref 6–23)
Chloride: 104 mEq/L (ref 96–112)
Chloride: 110 mEq/L (ref 96–112)
Creatinine, Ser: 2.14 mg/dL — ABNORMAL HIGH (ref 0.4–1.5)
Creatinine, Ser: 1.43 mg/dL (ref 0.4–1.5)
GFR calc non Af Amer: 32 mL/min — ABNORMAL LOW (ref >60)

## 2010-04-11 LAB — BRAIN NATRIURETIC PEPTIDE: Pro B Natriuretic peptide (BNP): 309 pg/mL — ABNORMAL HIGH (ref 0.0–100.0)

## 2010-04-11 LAB — HEPARIN LEVEL (UNFRACTIONATED): Heparin Unfractionated: 0.42 IU/mL (ref 0.30–0.70)

## 2010-04-12 ENCOUNTER — Inpatient Hospital Stay (HOSPITAL_COMMUNITY): Payer: PRIVATE HEALTH INSURANCE

## 2010-04-12 LAB — BASIC METABOLIC PANEL
CO2: 27 mEq/L (ref 19–32)
Calcium: 9.2 mg/dL (ref 8.4–10.5)
Creatinine, Ser: 2.22 mg/dL — ABNORMAL HIGH (ref 0.4–1.5)
GFR calc Af Amer: 37 mL/min — ABNORMAL LOW (ref >60)
GFR calc non Af Amer: 30 mL/min — ABNORMAL LOW (ref >60)

## 2010-04-12 LAB — PROTIME-INR
INR: 2.26 — ABNORMAL HIGH (ref 0.00–1.49)
Prothrombin Time: 25.1 seconds — ABNORMAL HIGH (ref 11.6–15.2)

## 2010-04-12 LAB — CBC
HCT: 44.6 % (ref 39.0–52.0)
Hemoglobin: 15.3 g/dL (ref 13.0–17.0)
RBC: 5 MIL/uL (ref 4.22–5.81)
WBC: 5.3 10*3/uL (ref 4.0–10.5)

## 2010-04-12 LAB — HEPARIN LEVEL (UNFRACTIONATED): Heparin Unfractionated: 0.76 IU/mL — ABNORMAL HIGH (ref 0.30–0.70)

## 2010-04-12 LAB — BRAIN NATRIURETIC PEPTIDE: Pro B Natriuretic peptide (BNP): 251 pg/mL — ABNORMAL HIGH (ref 0.0–100.0)

## 2010-04-13 ENCOUNTER — Inpatient Hospital Stay (HOSPITAL_COMMUNITY): Payer: PRIVATE HEALTH INSURANCE

## 2010-04-13 LAB — PROTIME-INR
INR: 2.24 — ABNORMAL HIGH (ref 0.00–1.49)
Prothrombin Time: 24.9 seconds — ABNORMAL HIGH (ref 11.6–15.2)

## 2010-04-13 LAB — BASIC METABOLIC PANEL
BUN: 31 mg/dL — ABNORMAL HIGH (ref 6–23)
Calcium: 9.7 mg/dL (ref 8.4–10.5)
Chloride: 100 mEq/L (ref 96–112)
Creatinine, Ser: 2.33 mg/dL — ABNORMAL HIGH (ref 0.4–1.5)

## 2010-04-13 LAB — BRAIN NATRIURETIC PEPTIDE: Pro B Natriuretic peptide (BNP): 211 pg/mL — ABNORMAL HIGH (ref 0.0–100.0)

## 2010-04-18 LAB — DIFFERENTIAL
Basophils Absolute: 0 10*3/uL (ref 0.0–0.1)
Basophils Relative: 1 % (ref 0–1)
Basophils Relative: 1 % (ref 0–1)
Eosinophils Absolute: 0.2 10*3/uL (ref 0.0–0.7)
Eosinophils Relative: 10 % — ABNORMAL HIGH (ref 0–5)
Eosinophils Relative: 10 % — ABNORMAL HIGH (ref 0–5)
Eosinophils Relative: 5 % (ref 0–5)
Lymphocytes Relative: 29 % (ref 12–46)
Lymphocytes Relative: 53 % — ABNORMAL HIGH (ref 12–46)
Lymphs Abs: 1.1 10*3/uL (ref 0.7–4.0)
Monocytes Absolute: 0.5 10*3/uL (ref 0.1–1.0)
Neutro Abs: 1 10*3/uL — ABNORMAL LOW (ref 1.7–7.7)
Neutrophils Relative %: 63 % (ref 43–77)

## 2010-04-18 LAB — CBC
HCT: 41.5 % (ref 39.0–52.0)
HCT: 43.1 % (ref 39.0–52.0)
HCT: 43.2 % (ref 39.0–52.0)
Hemoglobin: 14.2 g/dL (ref 13.0–17.0)
Hemoglobin: 14.8 g/dL (ref 13.0–17.0)
MCHC: 34.5 g/dL (ref 30.0–36.0)
MCV: 92 fL (ref 78.0–100.0)
MCV: 92.2 fL (ref 78.0–100.0)
Platelets: 169 10*3/uL (ref 150–400)
Platelets: 178 10*3/uL (ref 150–400)
Platelets: 189 10*3/uL (ref 150–400)
Platelets: 189 10*3/uL (ref 150–400)
RBC: 4.7 MIL/uL (ref 4.22–5.81)
RDW: 13 % (ref 11.5–15.5)
RDW: 13.2 % (ref 11.5–15.5)
WBC: 4 10*3/uL (ref 4.0–10.5)
WBC: 4.3 10*3/uL (ref 4.0–10.5)
WBC: 4.6 10*3/uL (ref 4.0–10.5)

## 2010-04-18 LAB — COMPREHENSIVE METABOLIC PANEL
Albumin: 3.3 g/dL — ABNORMAL LOW (ref 3.5–5.2)
Alkaline Phosphatase: 53 U/L (ref 39–117)
BUN: 29 mg/dL — ABNORMAL HIGH (ref 6–23)
CO2: 26 mEq/L (ref 19–32)
Chloride: 104 mEq/L (ref 96–112)
Creatinine, Ser: 1.68 mg/dL — ABNORMAL HIGH (ref 0.4–1.5)
GFR calc non Af Amer: 42 mL/min — ABNORMAL LOW (ref >60)
Potassium: 4.8 mEq/L (ref 3.5–5.1)
Total Bilirubin: 0.8 mg/dL (ref 0.3–1.2)

## 2010-04-18 LAB — BASIC METABOLIC PANEL
BUN: 11 mg/dL (ref 6–23)
BUN: 25 mg/dL — ABNORMAL HIGH (ref 6–23)
CO2: 24 mEq/L (ref 19–32)
CO2: 28 mEq/L (ref 19–32)
CO2: 30 mEq/L (ref 19–32)
Calcium: 8.9 mg/dL (ref 8.4–10.5)
Calcium: 9.2 mg/dL (ref 8.4–10.5)
Calcium: 9.3 mg/dL (ref 8.4–10.5)
Chloride: 100 mEq/L (ref 96–112)
Chloride: 104 mEq/L (ref 96–112)
Chloride: 105 mEq/L (ref 96–112)
Creatinine, Ser: 1.63 mg/dL — ABNORMAL HIGH (ref 0.4–1.5)
Creatinine, Ser: 1.96 mg/dL — ABNORMAL HIGH (ref 0.4–1.5)
Creatinine, Ser: 2.07 mg/dL — ABNORMAL HIGH (ref 0.4–1.5)
GFR calc Af Amer: 40 mL/min — ABNORMAL LOW (ref >60)
GFR calc Af Amer: 43 mL/min — ABNORMAL LOW (ref >60)
GFR calc Af Amer: 48 mL/min — ABNORMAL LOW (ref >60)
GFR calc Af Amer: >60 mL/min (ref >60)
GFR calc non Af Amer: 33 mL/min — ABNORMAL LOW (ref >60)
GFR calc non Af Amer: 35 mL/min — ABNORMAL LOW (ref >60)
GFR calc non Af Amer: 40 mL/min — ABNORMAL LOW (ref >60)
GFR calc non Af Amer: 56 mL/min — ABNORMAL LOW (ref >60)
Glucose, Bld: 126 mg/dL — ABNORMAL HIGH (ref 70–99)
Glucose, Bld: 162 mg/dL — ABNORMAL HIGH (ref 70–99)
Glucose, Bld: 99 mg/dL (ref 70–99)
Potassium: 3.7 mEq/L (ref 3.5–5.1)
Potassium: 4 mEq/L (ref 3.5–5.1)
Potassium: 4.1 mEq/L (ref 3.5–5.1)
Sodium: 138 mEq/L (ref 135–145)
Sodium: 139 mEq/L (ref 135–145)
Sodium: 139 mEq/L (ref 135–145)
Sodium: 140 mEq/L (ref 135–145)

## 2010-04-18 LAB — CK TOTAL AND CKMB (NOT AT ARMC)
CK, MB: 1.7 ng/mL (ref 0.3–4.0)
CK, MB: 2.1 ng/mL (ref 0.3–4.0)
Relative Index: 1.3 (ref 0.0–2.5)
Relative Index: 1.7 (ref 0.0–2.5)
Total CK: 125 U/L (ref 7–232)
Total CK: 130 U/L (ref 7–232)

## 2010-04-18 LAB — POCT CARDIAC MARKERS
CKMB, poc: 2.2 ng/mL (ref 1.0–8.0)
CKMB, poc: 2.5 ng/mL (ref 1.0–8.0)
Troponin i, poc: <0.05 ng/mL (ref 0.00–0.09)

## 2010-04-18 LAB — TROPONIN I: Troponin I: 0.01 ng/mL (ref 0.00–0.06)

## 2010-04-18 LAB — PROTIME-INR
INR: 1 (ref 0.00–1.49)
Prothrombin Time: 13.8 seconds (ref 11.6–15.2)

## 2010-04-18 LAB — D-DIMER, QUANTITATIVE: D-Dimer, Quant: 0.23 ug/mL-FEU (ref 0.00–0.48)

## 2010-04-18 LAB — DIGOXIN LEVEL: Digoxin Level: <0.2 ng/mL — ABNORMAL LOW (ref 0.8–2.0)

## 2010-04-18 LAB — MAGNESIUM: Magnesium: 2.1 mg/dL (ref 1.5–2.5)

## 2010-04-18 LAB — APTT: aPTT: 35 seconds (ref 24–37)

## 2010-05-05 NOTE — Discharge Summary (Signed)
[name], [name]             ACCOUNT NO.:  [id number]  MEDICAL RECORD NO.:  [id number]           PATIENT TYPE:  I  LOCATION:  4711                         FACILITY:  [facility]  PHYSICIAN:  [name], M.D. DATE OF BIRTH:  1950-11-18  DATE OF ADMISSION:  04/08/2010 DATE OF DISCHARGE:  04/13/2010                              DISCHARGE SUMMARY   ADMITTING DIAGNOSES: 1. Decompensated systolic heart failure secondary to noncompliance to     medication and diet. 2. Uncontrolled hypertension. 3. Severe nonischemic cardiomyopathy status post implantable     cardioverter defibrillator in the past. 4. History of atrial fibrillation and nonsustained ventricular     tachycardia. 5. Hypercholesteremia. 6. Sarcoidosis. 7. Glucose intolerance. 8. Chronic kidney disease. 9. Gastroesophageal reflux disease.  DISCHARGE DIAGNOSES: 1. Compensated systolic heart failure. 2. Hypertension. 3. Severe nonischemic cardiomyopathy status post implantable     cardioverter defibrillator. 4. History of atrial fibrillation and nonsustained ventricular     tachycardia, asymptomatic. 5. Hypercholesteremia. 6. Sarcoidosis. 7. Glucose intolerance. 8. Gastroesophageal reflux disease. 9. Chronic kidney disease.  DISCHARGE HOME MEDICATIONS: 1. Amiodarone 100 mg 1 tablet daily. 2. Carvedilol 12.5 mg 1 tablet twice daily. 3. Lasix 80 mg 1 tablet twice daily. 4. Percocet 5/325 1-2 tablets every 8 hours for pain. 5. Protonix 40 mg 1 tablet daily. 6. Crestor 5 mg 1 tablet daily at night. 7. Aldactone 25 mg 1 tablet twice daily. 8. Carafate 1 gram every 6 hours with meals. 9. Warfarin 12.5 mg 1 tablet daily. 10.Allopurinol 100 mg 1 tablet daily.  DIET:  Low-salt, low cholesterol, 1800 calories ADA diet.  The patient has been advised to monitor his weight and blood pressure daily.  If there is gain of 3-4 pounds in 1-2 days or 2 pounds overnight or develops shortness of breath, abdominal or hand or  feet swelling should call my office immediately.  The patient has been advised to followup with me in 1 week.  We will check CBC, BMP and PT/INR in 1 week.  The patient has been advised to stay off the ACE inhibitor at present.  Once we will check his renal function as outpatient, if renal function remained stable we will restart him on ACE inhibitor as indicated as an outpatient.  CONDITION AT DISCHARGE:  Stable.  BRIEF HISTORY AND HOSPITAL COURSE:  [name] is a 60 year old black male with past medical history significant for severe nonischemic cardiomyopathy status post ICD in the past, history of recurrent CHF secondary to systolic dysfunction, hypertension, history of AFib and nonsustained VT in the past, glucose intolerance, sarcoidosis, GERD, and chronic kidney disease.  He came to the ER complaining of progressive increasing shortness of breath associated with abdominal swelling and leg swelling for last 1 week.  Denies any chest pain, nausea, vomiting, diaphoresis.  Denies palpitation, lightheadedness or syncope butcomplains of generalized weakness and no energy.  Denies any ICD discharges.  Denies noncompliance to medicine although his INR is only 1.05.  Denies excessive salty food intake.  Denies cough, fever, chills. Denies any urinary complaints.  PAST MEDICAL HISTORY:  As above.  PAST SURGICAL HISTORY:  He had ICD  in the past.  ALLERGIES:  No known drug allergies.  MEDICATIONS:  At home: 1. He was on warfarin 12.5 mg p.o. daily. 2. Allopurinol 100 mg daily. 3. Amiodarone 100 mg p.o. daily. 4. Coreg 3.125 mg p.o. b.i.d. 5. Crestor 5 mg p.o. daily. 6. Digoxin 0.25 mg daily. 7. Lasix 80 mg p.o. daily. 8. Lisinopril 5 mg p.o. daily. 9. Protonix 40 mg p.o. daily. 10.Aldactone 25 mg p.o. daily.  SOCIAL HISTORY:  He is divorced on disability.  No history of smoking or alcohol abuse.  FAMILY HISTORY:  Positive for coronary artery disease.  PHYSICAL EXAMINATION:   GENERAL:  He is alert, awake and oriented x3. Blood pressure was 139/101, pulse was 91, respiration rate 18, temperature was 97.8. EYES:  Conjunctivae was pink. NECK:  Supple.  Positive JVD and he has bibasilar rales. CARDIOVASCULAR:  S1-S2 are normal.  There was soft systolic murmur heard and had S3 gallop. ABDOMEN:  Soft.  Bowel sounds were present, distended. EXTREMITIES:  There is no clubbing, cyanosis.  There was 1+ edema.  LABORATORY DATA:  His hemoglobin was 14.4, hematocrit 41.8, white count was 6.7.  Cardiac enzymes CK-MB was 2.1, troponin-I was less than 0.01, his PT/INR was 13.9.  His BNP was 1386.  Sodium was 137, potassium 3.8, BUN 15, creatinine 1.66, glucose was 97.  His dig level was less than 0.2.  His repeat BNP on April 09, 2010, was 1349, on April 12, 2010, BNP is 251, today BNP is 211, which is trending down.  BRIEF HOSPITAL COURSE:  The patient was admitted to telemetry unit.  MI was ruled out by serial enzymes and EKG.  The patient was started on IV Lasix and continued on his home medication and was started on heparin plus Coumadin per pharmacy protocol.  The patient did not had any episodes of chest pain during the hospital stay but complained of vague abdominal pain.  The patient had extensive GI workup in the past which has been negative.  The patient's chest x-ray today showed no evidence of pulmonary edema or infiltrate.  The patient has been ambulating in the hallway without any problems.  His abdominal and leg swelling is completely resolved.  The patient has been discussed in the past regarding cardiac transplant which he has refused but stated he will talk to doctor at [facility].  The patient is being also followed at [place].     Eduardo Osier. Sharyn [name], M.D.     MNH/MEDQ  D:  04/13/2010  T:  04/14/2010  Job:  [id number]  Electronically Signed by Rinaldo Cloud M.D. on 05/05/2010 08:30:37 AM

## 2010-05-17 NOTE — Consult Note (Signed)
NAMEMANOJ, Darrell             ACCOUNT NO.:  [id number]   MEDICAL RECORD NO.:  [id number]          PATIENT TYPE:  INP   LOCATION:  1423                         FACILITY:  [facility]   PHYSICIAN:  Doylene Canning. Ladona Ridgel, MD    DATE OF BIRTH:  [birth year]   DATE OF CONSULTATION:  01/11/2007  DATE OF DISCHARGE:  01/11/2007                                 CONSULTATION   REQUESTED BY:  Dr. Rinaldo Cloud.   INDICATION FOR CONSULTATION:  Evaluation of nonischemic cardiomyopathy,  congestive heart failure, EF 15%, for consideration for ICD  implantation.  The patient also has a history of nonsustained VT.  The  patient is a 60 year old man with longstanding nonischemic  cardiomyopathy, who was admitted to the hospital with shortness of  breath, chest pain and cough and was found to be fairly decompensated  with regard to his heart failure.  He has been treated with intravenous  diuresis and has improved.  He has nonsustained VT and notes symptomatic  palpitations, though he has never had frank syncope associated with his  palpitations.  He does have dizziness.  He at baseline has class II  heart failure symptoms.   PAST MEDICAL HISTORY:  Notable for:  1. A remote history of drug abuse.  2. He has a history of sarcoidosis.  3. He has a history of dyslipidemia and hypertension.   FAMILY HISTORY:  Notable for hypertension and bradycardia.  His father  has a pacemaker and also has heart failure.   SOCIAL HISTORY:  The patient presently denies alcohol or drug use.   MEDICATIONS:  1. Carvedilol 6.25 twice daily.  2. Altace 2.5 mg daily.  3. Digoxin 0.25 mg daily.  4. Lasix 40 mg twice daily.  5. Aldactone 25 mg daily.  6. Lipitor 40 mg daily.   REVIEW OF SYSTEMS:  Otherwise unremarkable.  He does admit to occasional  problems with dietary and medical noncompliance.   PHYSICAL EXAM:  GENERAL:  He is pleasant, well-appearing 60 year old man  in no acute distress.  VITAL SIGNS:  The blood  pressure was 118/60, the pulse was 78 and  regular and the respirations were 18.  HEENT:  Normocephalic and atraumatic.  Pupils are equal and round.  The  oropharynx is moist.  The sclerae are anicteric.  NECK:  Revealed 7 cm of jugular venous distention.  There is no  thyromegaly.  Trachea is midline.  The carotid are 2+ and symmetric.  LUNGS:  Clear bilaterally to auscultation; no wheezes, rales or rhonchi  are present.  There is no increased work of breathing.  CARDIOVASCULAR:  Exam revealed a regular rate and rhythm and a normal S1 and S2.  There  is soft S4 gallop present.  The PMI was enlarged and laterally  displaced.  ABDOMEN:  Soft, nontender and nondistended.  There is no organomegaly.  The bowel sounds are present.  There is no rebound or guarding.  EXTREMITIES:  Demonstrate no cyanosis, clubbing or edema.  The pulses  are 2+ and symmetric.  NEUROLOGIC:  Alert and oriented x3 with cranial nerves intact.  The  strength was  5/5 and symmetric.   EKG:  Demonstrates sinus rhythm with left atrial enlargement and  incomplete right bundle branch block.  The QRS duration was 100  milliseconds.   IMPRESSION:  1. Nonischemic cardiomyopathy with severe left ventricular      dysfunction, ejection fraction of 20%.  2. Congestive heart failure, acute on chronic systolic heart failure      with a narrow QRS.  3. Hypertension.  4. Dyslipidemia.  5. Nonsustained ventricular tachycardia.   DISCUSSION:  I have discussed the treatment options with the patient and  the risks, benefits, goals and expectations of prophylactic ICD  insertion have been discussed with him.  He is considering his options.  I have recommended that he be allowed to go home and return early next  week for ICD implantation.      Doylene Canning. Ladona Ridgel, MD  Electronically Signed     GWT/MEDQ  D:  01/11/2007  T:  01/12/2007  Job:  [id number]

## 2010-05-17 NOTE — Discharge Summary (Signed)
[name], [name]             ACCOUNT NO.:  [id number]   MEDICAL RECORD NO.:  [id number]          PATIENT TYPE:  INP   LOCATION:  3703                         FACILITY:  [facility]   PHYSICIAN:  Elmore Guise., M.D.DATE OF BIRTH:  07-12-1950   DATE OF ADMISSION:  04/23/2007  DATE OF DISCHARGE:  04/26/2007                               DISCHARGE SUMMARY   DISCHARGE DIAGNOSES:  1. Congestive heart failure exacerbation.  2. 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  3. Hypertension.  4. Dyslipidemia  5. Chronic headaches.   HISTORY OF PRESENT ILLNESS:  [name] is a 60 year old male who  presented with worsening dyspnea.  We tried to treat him as an  outpatient with increasing dose of Lasix.  However, he continued to have  worsening orthopnea, dyspnea on exertion.  He was admitted for IV  diuresis.   HOSPITAL COURSE:  The patient's hospital course was uncomplicated.  He  underwent diuresis with Lasix initially at 80 mg IV 3 times daily.  He  had excellent diuresis of approximately 1.6-2 liters every day.  His  Lasix dose was then decrease down to twice daily and then changed to  oral.  His creatinine did peak at 2.0.  His blood pressure normalized on  his current treatment.  He now is having no further orthopnea.  He has  been up and ambulatory without any significant problems.   MEDICATIONS:  He will be discharged home today on the following  medications:  1. Coreg 12.5 mg twice daily.  2. Digoxin 0.25 mg daily.  3. Aldactone 25 mg daily.  4. Lipitor 10 mg daily.  5. Lasix 120 mg in the morning and 80 mg in the evening.  6. Altace 5 mg daily.  7. Hydralazine 25 mg twice daily.  8. Topamax once daily.  9. Percocet 5/325 mg, as needed for pain.   Unless he has any further problems, I plan to see him back in the office  in 2 weeks.  His lab results on discharge showed a BUN and creatinine of  35 and 2.0.  His BNP peaked at  1480.      Elmore Guise., M.D.  Electronically Signed     TWK/MEDQ  D:  04/26/2007  T:  04/27/2007  Job:  [id number]

## 2010-05-17 NOTE — H&P (Signed)
[name], [name]             ACCOUNT NO.:  [id number]   MEDICAL RECORD NO.:  [id number]          PATIENT TYPE:  EMS   LOCATION:  ED                           [facility]ACILITY:  [facility]   PHYSICIAN:  Armanda Magic, M.D.     DATE OF BIRTH:  11-09-1950   DATE OF ADMISSION:  06/30/2007  DATE OF DISCHARGE:                              HISTORY & PHYSICAL   PRIMARY CARDIOLOGIST:  Rosine Abe, M.D.   CHIEF COMPLAINT:  Chest pain.   HISTORY OF PRESENT ILLNESS:  This is a 60 year old African-American male  who was in his usual state of health until 4 days ago when he developed  weakness, increasing shortness of breath, fluid retention and occasional  chest pressure, midsternal location, with no radiation.  He has a  history of a non-ischemic dilated cardiomyopathy and was actually  admitted back in April of 2009 for CHF exacerbation.  Since then, he has  seen Dr. Reyes Ivan and has had his Lasix dose adjusted.  The patient is  unclear as to what medications he is on at this time.  He states his  chest pain has been present constantly for 4 days.  It currently is  7/10.  He says he always gets the say type of chest pain whenever he  gets volume overloaded.  He also complains of a headache and apparently  has chronic headaches.  Apparently, the patient has no nitroglycerin to  take and has not tried any.  He states when he gets volume overloaded,  he has this chest pain that will not go away until he is diuresed.  Over  the past 4 days, he has taken 120 mg of Lasix daily.   PAST MEDICAL HISTORY:  1. Questionable MI per the patient, although there is no evidence of      this in the records.  2. Congestive heart failure status post a recent hospitalization in      April of 2009.  3. Status post AICD implantation.  4. Chronic headaches.  5. 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  6. Dyslipidemia.  7. Hypertension.   PAST SURGICAL HISTORY:  Status post AICD  implantation.   ALLERGIES:  He has an intolerance to INDOMETHACIN.   MEDICATIONS:  1. He thinks possibly digoxin 0.25 mg a day, which is on his discharge      sheet from April.  2. He states he thinks he is on Diovan 320 mg a day, although there is      no record of this on his discharge medications back in April.  3. Lasix 40 mg b.i.d.  4. Coreg 12.5 mg b.i.d.  5. Aspirin 325 mg every other day.  6. Aldactone 25 mg a day.  7. He also thinks he might be on Avapro 150 mg a day, but he is not      sure if it might not be Altace 5 mg a day.  8. Also, under his discharge medications from April, he was on      hydralazine 25 mg b.i.d., and he does not remember if he is on this  or not.  9. Also from his discharge, he was on Lipitor 10 mg a day.  10.From his discharge, he was on Topamax.   SOCIAL HISTORY:  He denies any tobacco or alcohol use, except for a very  rare drinking of beer.   FAMILY HISTORY:  His father is 75.  He had a history of an MI and  congestive heart failure.  His mother is 48 with hypertension.  He has a  sister who died of an MI at 63, a nephew who died of an MI or 96 and a  brother with a heart murmur.   REVIEW OF SYSTEMS:  His 12 review of systems, other than what is stated  in the HPI, is negative.   PHYSICAL EXAMINATION:  VITAL SIGNS:  Blood pressure is 132/81, heart  rate 67, O2 saturation 96% on room air.  GENERAL:  A well-developed, well-nourished, black male in no acute  distress.  HEENT:  Benign.  Pupils equal, round and reactive to light and  accommodation.  Extraocular muscles are intact bilaterally.  NECK:  Supple without bruit.  LUNGS:  Clear to auscultation anteriorly except for crackles at the  bases bilaterally.  HEART:  Regular rate and rhythm.  No murmurs, rubs, or gallops.  Normal  S1 and S2.  ABDOMEN:  Soft, nontender, nondistended, with active bowel sounds.  No  hepatosplenomegaly.  EXTREMITIES:  No edema.   LABORATORY DATA:  BNP  of 601.  Sodium 140, potassium 3.8, chloride 106,  bicarbonate 26, BUN 19, creatinine 1.64.  White cell count is 4.6,  hemoglobin 14.1, hematocrit 41.7, platelet count 114.  Point of care  markers x1 is negative.  Chest x-ray shows no acute disease.  There is  cardiomegaly.  EKG shows sinus rhythm with LVH by voltage criteria, left  anterior fascicular block, incomplete right bundle branch block and T  wave inversions in V3 through V6 and one in aVL.  I do not have an old  EKG for comparison.   ASSESSMENT:  1. Congestive heart failure exacerbation with elevated BNP.  There is      no overt congestive heart failure on chest x-ray, but he does have      crackles in his lung bases.  2. Chest pain, questionably secondary to congestive heart failure.      His enzymes are negative x1, and pain has been constant for 4 days      with no elevation in his enzymes.  He says he gets similar pain      every time he has been admitted with congestive heart failure      exacerbation.  3. Dilated cardiomyopathy, ejection fraction of 10%.  4. Hypertension, stable.   PLAN:  1. Admit to telemetry bed under Dr. Silva Bandy service.  Rule out      myocardial infarction with serial enzymes.  2. IV Lasix 40 mg b.i.d.  3. Will have the patient's family bring in his medications from home      since there is a discrepancy in the discharge medications from      April hospitalization and what he can remember today.  I will go      ahead and continue him on Aldactone 25 mg a day, aspirin, Coreg      12.5 mg b.i.d., digoxin 0.25 mg a day, Lipitor 10 mg a day, Altace      5 mg a day and hydralazine 25 mg b.i.d. since this is what his  discharge summary stated several months ago, and we will have a      reconciliation of these in the morning.  4. Subcutaneous Lovenox per pharmacy.  5. IV nitroglycerin drip for chest pain.  6. Chest a BMET and BNP, as well as a TSH in the a.m.  Further      treatment per Dr.  Reyes Ivan.      Armanda Magic, M.D.  Electronically Signed     TT/MEDQ  D:  06/30/2007  T:  06/30/2007  Job:  [id number]   cc:   Elmore Guise., M.D.  Fax: [phone]

## 2010-05-17 NOTE — H&P (Signed)
NAMEVARNELL, ORVIS             ACCOUNT NO.:  [id number]   MEDICAL RECORD NO.:  [id number]          PATIENT TYPE:  INP   LOCATION:  3703                         FACILITY:  [facility]   PHYSICIAN:  Elmore Guise., M.D.DATE OF BIRTH:  01-10-1950   DATE OF ADMISSION:  04/23/2007  DATE OF DISCHARGE:                              HISTORY & PHYSICAL   INDICATION FOR ADMISSION:  CHF exacerbation.   HISTORY OF PRESENT ILLNESS:  Mr. Riolo is a very pleasant 60 year old  African American male with past medical history of nonischemic  cardiomyopathy, hypertension, dyslipidemia and history of sarcoidosis  who presents with one month history of progressive shortness of breath.  He has over the same time period had required increasing doses of Lasix,  and actually over the last week has started Zaroxolyn with no  significant effects or improvement in his symptoms.  Actually, his  symptoms have worsened.  He is now limited to exertion around for short  steps, primarily around the house or for a short burst of activity  because of his shortness of breath.  He has three-pillow orthopnea and  in some cases is sitting upright in a recliner.  He also notes PND, as  well as lower extremity edema.  His weight has not changed despite  increasing doses of Lasix and Zaroxolyn.  Since starting Zaroxolyn, he  has also noticed some loose stools.  He had a fever last week and some  tightness, as well as early satiety in his abdomen.  He has had  occasional nausea, but no vomiting and no cough.  He is taking his  medications as instructed.   CURRENT MEDICATIONS:  1. Diovan 320 mg daily.  2. Coreg 12.5 mg twice daily.  3. Digoxin 0.125 mg daily.  4. Aldactone 25 mg daily.  5. Lipitor 10 mg daily.  6. Lasix 160 mg twice daily.  7. Altace 5 mg daily.  8. Hydralazine 50 mg twice daily.  9. Zaroxolyn 5 mg twice daily.  10.Topamax 25 mg daily.  11.Baclofen p.r.n.   ALLERGIES:  INDOMETHACIN.   REVIEW OF  SYSTEMS:  As per HPI.  All others are negative.   FAMILY HISTORY:  Positive for heart disease (CHF and coronary disease),  as well as CVA.   SOCIAL HISTORY:  He is single.  No tobacco or alcohol currently.   PHYSICAL EXAMINATION:  VITAL SIGNS:  His weight is to 232 pounds, his  dry weight is 221 pounds.  Blood pressure is 138/100.  His heart rate is  84 and regular.  GENERAL:  He is a very pleasant middle-aged African American male alert  and oriented x4 in no acute distress.  NECK:  He has mild JVD.  LUNGS:  Bibasilar crackles.  HEART:  Regular with S3/S4 gallop.  ABDOMEN:  Soft, nontender, nondistended.  No rebound or guarding.  EXTREMITIES:  Have 1+ pretibial edema.   His blood work done today in the office showed a BNP level of 935,  typically he runs in the 200-300 range.  His BUN and creatinine are 23  and 1.4 with a potassium level of  4.7.   He has had full workup in the past and has undergone an ICD implant by  Dr. Lewayne Bunting for a diagnosis of nonischemic cardiomyopathy.   IMPRESSION:  1. Congestive heart failure exacerbation.  2. History of dilated cardiomyopathy.  3. Hypertension.  4. Dyslipidemia.  5. History of sarcoidosis.   PLAN:  1. The patient will be admitted for IV diuretics.  We will start Lasix      80 mg IV q.8 h.  My goal is to achieve somewhere between 2-3 liters      negative per day.  We will continue his Diovan, Coreg, digoxin,      Aldactone and Lipitor as before, as well as his hydralazine.  He      will have blood work, including a CBC, CMP, as well as a chest x-      ray and an EKG on admission.  Because of his headaches, we will      continue Topamax, as well as cardiology p.r.n. orders to help with      any other musculoskeletal pains or complaints of nausea, vomiting      or diarrhea.      Elmore Guise., M.D.  Electronically Signed     TWK/MEDQ  D:  04/23/2007  T:  04/23/2007  Job:  [id number]

## 2010-05-17 NOTE — Assessment & Plan Note (Signed)
[facility]                         ELECTROPHYSIOLOGY OFFICE NOTE   EDD, REPPERT                    MRN:          [id number]  DATE:03/20/2007                            DOB:          1950/08/18    [name] returns today for follow-up.  He is a very pleasant, middle-  aged male with a nonischemic cardiomyopathy and nonsustained ventricular  tachycardia.  He underwent ICD implantation back in January of this  year.  He returns today for follow-up.  The patient has had a history of  dizzy spells in the past and these have resolved.  He had no specific  complaints today.   PHYSICAL EXAM:  He is a pleasant well-appearing middle-aged man in no  acute distress.  Blood pressure was 138/74, the pulse was 60 and regular, respirations  were 18.  NECK:  Revealed no jugular venous distention.  LUNGS:  Clear bilaterally to auscultation.  No wheezes, rales or rhonchi  are present.  CARDIOVASCULAR:  A regular rate and rhythm with normal S1 and S2. There  were no murmurs, rubs or gallops today except for an S4.  EXTREMITIES:  Demonstrated no edema.   Interrogation of his defibrillator demonstrates a St. Jude current VRRF  model 1207-36 single chamber device. The R waves were greater than 12,  the impedance was 410,  the threshold was less than a volt at 0.4  milliseconds.  He was 99% V sensed.  There were no intercurrent IC  therapies.  His threshold was 1.5 and 0.5.   IMPRESSION:  1. Nonischemic cardiomyopathy.  2. Congestive heart failure, presently class II.  3. Hypertension.   DISCUSSION:  Overall [name] is stable. His defibrillator is working  normally.  We will plan to see him back in the office in approximately 9  months.     Doylene Canning. Ladona Ridgel, MD  Electronically Signed    GWT/MedQ  DD: 03/20/2007  DT: 03/21/2007  Job #: [id number]   cc:   Elmore Guise., M.D.

## 2010-05-17 NOTE — Assessment & Plan Note (Signed)
[facility]                         ELECTROPHYSIOLOGY OFFICE NOTE   ALTAN, KRAAI                    MRN:          [id number]  DATE:01/17/2007                            DOB:          10-11-1950    [name] is an add-on today.  His presenting circumstance is I hurt  all over in my chest; it has gotten worse and I cough a lot more.   HISTORY OF PRESENT ILLNESS:  Mr. Kron is a 60 year old male.  He has  a history of nonischemic cardiomyopathy, ejection fraction estimated at  15%.  He has a history of NSVT and recently December 14, 2007, had a  cardioverter-defibrillator implanted.  At the time of his admission  December 12th he was on Zithromax for URI/bronchial distress.   The implant went well but the patient was complaining of chest  discomfort so it was thought to be secondary to implant site and that is  where the patient indicated that most of the discomfort was.  He was  discharged December 15, 2007, with Percocet which seemed to be working  well.  On the next day, January 14th, his cough had become more severe.  Before it was productive of clear sputum, now the sputum was yellow and  very viscous.  The patient has felt hot and cold all Wednesday, with  cough or movement he notes increased chest pain.  He is unable to lie  flat secondary to the chest pain, swallow is also difficult If you put  something down my throat.  Chest x-ray in the office shows slight right  lower lobe atelectasis.  The patient was not febrile when his  temperature was taken here at the office.   ALLERGY:  No known drug allergies.   PAST MEDICAL HISTORY:  1. Nonischemic cardiomyopathy, ejection fraction 15%.  2. Patient is day 3 status post implant St. Jude Current single      chamber cardioverter-defibrillator.  3. Hypertension.  4. Dyslipidemia.  5. Sarcoidosis.  6. Recent URI/bronchial infection.  7. History of nonsustained ventricular  tachycardia.   MEDS:  1. Diovan 320 mg daily.  2. Coreg 12.5 mg twice daily.  3. BiDil three times daily.  4. Digoxin 0.25 mg daily.  5. Aldactone 25 mg daily.  6. Lasix 40 mg daily.  7. Lipitor 10 mg daily at bedtime.   REVIEW OF SYSTEMS:  Hot and cold flashes.  Chest pain with movement with  cough with deep breathing.  Upper extremities feel numb.  The patient is  not complaining of dyspnea.  He does have a productive cough which has  gotten worse.  He has no palpitation, no dizziness, no presyncope.   PHYSICAL EXAM:  He is alert and oriented x3, he is in distress  definitely from chest pain, he feels sick.  Temperature 98.3, blood  pressure 132/85, respirations 18, pulse was 80.  LUNGS:  Rales at the right base, otherwise clear.  HEART:  Regular rate and rhythm without murmur.  Electrocardiogram shows  sinus arrhythmia, rate of 80.  ABDOMEN:  Soft, nondistended, bowel sounds are present.  EXTREMITIES:  Show  no clubbing, cyanosis or edema.  NEUROLOGIC:  Grossly intact.  The patient has chest pain with  inspiration or movement, it is centralized chest pain.  Actual palpation  in the area of the cardioverter-defibrillator does not produce or  inspire chest pain or pain of any kind.   PLAN:  1. Admit to telemetry.  2. Admission laboratories will be CBC, BMET, CK, CK MB and troponin I.  3. Culture of urine, sputum and blood x2.  4. Start IV Rocephin after cultures.  5. Pain control with both Percocet and morphine.  6. Continue home meds.  7. Cardiology admission but he may need may need Internal Medicine      consult.      Maple Mirza, PA  Electronically Signed      Doylene Canning. Ladona Ridgel, MD  Electronically Signed   GM/MedQ  DD: 01/17/2007  DT: 01/17/2007  Job #: [id number]

## 2010-05-17 NOTE — Discharge Summary (Signed)
[name], [name]             ACCOUNT NO.:  [id number]   MEDICAL RECORD NO.:  [id number]          PATIENT TYPE:  INP   LOCATION:  4731                         FACILITY:  [facility]   PHYSICIAN:  Elmore Guise., M.D.DATE OF BIRTH:  [birth year]   DATE OF ADMISSION:  08/11/2008  DATE OF DISCHARGE:  08/14/2008                               DISCHARGE SUMMARY   DISCHARGE DIAGNOSES:  1. Nonischemic cardiomyopathy (ejection fraction 10-15%).  2. Hypertension.  3. Gastroesophageal reflux disease.  4. History of liver function test abnormalities.  5. Questionable history of noncompliance.   HISTORY OF PRESENT ILLNESS:  [name] is a 60 year old African  American male who presented with increasing shortness of breath, chest  pain, orthopnea, PND.  He was admitted for mild heart failure  exacerbation.   HOSPITAL COURSE:  The patient's hospital course was uncomplicated.  He  was initially placed on a milrinone drip to help with better cardiac  output.  He was also placed on Lasix 60 mg IV twice daily.  He had  excellent urine output.  He was weaned off his milrinone over the next  36 hours.  We changed his Lasix to oral dosing.  We did have to decrease  down his medications decreasing his Altace to 5 mg once daily and  decreasing down his Coreg to 12.5 mg twice daily.  His blood pressure  was stable, running from 105-115 over 60-70.  He has now been up and  ambulatory.  He denies any further problems with shortness of breath.  He has had no orthopnea or PND.  His renal function ranged from 1.6-1.8  during his hospitalization.  Because of burning with urination, A UA was  checked and it was negative.  He will be discharged home today to  continue the following medications.  1. Coreg 12.5 mg twice daily.  2. Altace 5 mg daily.  3. Lasix 80 mg once daily.  4. Spironolactone 25 mg once daily.  5. Protonix 40 mg daily.  6. Digoxin 0.125 mg daily.   I recommend followup appointment with  Dr. Reyes Ivan of [facility]  Cardiology in 2 weeks.  He should have routine post hospitalization  recommendations.  He should have less than 2 L fluid intake per day.  He  should do daily weights and should his weight increase greater than 3  pounds in any 24 or 48-hour period, he is to increase his Lasix to twice  daily.  I strongly encouraged him to take his medications as listed  above.  If he can continue with good compliance, I would recommend  referral to a tertiary care center for possible transplant evaluation.      Elmore Guise., M.D.  Electronically Signed     TWK/MEDQ  D:  08/14/2008  T:  08/15/2008  Job:  [id number]

## 2010-05-17 NOTE — H&P (Signed)
NAMEEMIDIO, WARRELL             ACCOUNT NO.:  [id number]   MEDICAL RECORD NO.:  [id number]          PATIENT TYPE:  INP   LOCATION:  4731                         FACILITY:  [facility]   PHYSICIAN:  Elmore Guise., M.D.DATE OF BIRTH:  1950/11/27   DATE OF ADMISSION:  08/11/2008  DATE OF DISCHARGE:                              HISTORY & PHYSICAL   INDICATION FOR ADMISSION:  CHF exacerbation.   HISTORY OF PRESENT ILLNESS:  Mr. Eskelson is a 60 year old white male  with past medical history of nonischemic cardiomyopathy, hypertension,  reflux disease, status post single chamber ICD implant, dyslipidemia,  who presents with 3-week history of increasing dyspnea.  The patient has  had increased stress here recently.  His father passed away  approximately 1 week ago.  Since that time, Mr. Whitby reports  increased dyspnea on exertion, orthopnea, PND, very mild lower extremity  edema.  His weight is up 2 pounds.  He also reports some heaviness in  his chest.  He states that when he tries to get up to do anything he  just has no energy.  He also states I hurt all over.  He came for an  office visit yesterday, when seen, he had very mild crackles in his  bases as well as a soft S4 on exam.  I recommend that he increase his  Lasix.  He did this overnight; however, continues to feel bad today.  He  now presents for hospital admission.  He denies any fever or chills.  He  does report a knot in his stomach.  This comes and goes, but tends to  get worse when his fluid level was high.  He denies any diarrhea or  constipation.  He reports good urine output.   REVIEW OF SYSTEMS:  As per HPI.  All others are negative.   CURRENT MEDICATIONS:  1. Coreg 25 mg twice daily.  2. Lasix 120 mg twice daily.  3. Protonix 40 mg daily.  4. Ramipril 5 mg daily.  5. Digoxin 0.125 mg daily.  6. Spironolactone 25 mg daily.   ALLERGIES:  INDOMETHACIN.   SOCIAL HISTORY:  The patient is currently on disability.   He denies any  tobacco or alcohol use.   FAMILY HISTORY:  Positive for heart disease, heart failure, and stroke.   PHYSICAL EXAMINATION:  VITAL SIGNS:  Blood pressure 150/70, heart rate  80.  GENERAL:  He is a very pleasant middle-aged [place] American male, alert  and oriented x4 in no acute distress.  HEENT:  Unremarkable.  Sclera are anicteric.  Mucous membranes are  moist.  NECK:  Supple with very mild JVD.  LUNGS:  Bibasilar crackles.  HEART:  Regular with 2/6 holosystolic murmur with soft S4 noted.  ABDOMEN:  Soft, nontender, nondistended.  No rebound or guarding.  No  masses noted.  EXTREMITIES:  Warm with trace pretibial edema.  NEUROLOGIC:  No focal deficits.   His last echo done, January 11, 2008, showed an EF of 10-15% with severe  diffuse global hypokinesis.  Mild aortic valve regurgitation was noted.   IMPRESSION:  1. 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  2. History of nonischemic dilated cardiomyopathy.  3. Hypertension.  4. Gastroesophageal reflux disease.   PLAN:  The patient will be admitted to the hospital.  We will start him  on nitroglycerin as well as milrinone.  I will continue his Coreg for  now.  He will be placed on IV Lasix 60 mg q.12 h.  He will have routine  blood work including CBC, CMP, BNP, TSH.  He does have known mild  chronic liver enzyme abnormalities, which was thought secondary to  hepatic congestion.  He has undergone upper quadrant ultrasound, which  had shown thickened gallbladder wall, but no gallstones and no  significant liver abnormalities.  Further measures as his course  dictates.      Elmore Guise., M.D.  Electronically Signed     TWK/MEDQ  D:  08/11/2008  T:  08/12/2008  Job:  [id number]

## 2010-05-17 NOTE — Discharge Summary (Signed)
NAMELAMON, ROTUNDO             ACCOUNT NO.:  [id number]   MEDICAL RECORD NO.:  [id number]          PATIENT TYPE:  INP   LOCATION:  2040                         FACILITY:  [facility]   PHYSICIAN:  Maple Mirza, PA   DATE OF BIRTH:  1950/12/30   DATE OF ADMISSION:  01/14/2007  DATE OF DISCHARGE:  01/15/2007                               DISCHARGE SUMMARY   ALLERGIES:  No known drug allergies.   This dictation and exam greater than 35 minutes.   FINAL DIAGNOSES:  1. Discharged day 1 status post implant of a St. Jude CURRENT, VR, RS      single-chamber cardioverter defibrillator.  2. Non-sustained ventricular tachycardia/non-ischemic cardiomyopathy.   SECONDARY DIAGNOSES:  1. Recent admission January 4, to January 11, 2007 with mild acute on      chronic New York Heart Association class II-III congestive heart      failure.  2. Non-ischemic cardiomyopathy, ejection fraction 15%.  3. Hypertension.  4. Dyslipidemia.  5. Sarcoidosis.   PROCEDURE:  January 14, 2007, implant St. Jude CURRENT single-chamber  cardioverter defibrillator by Dr. Lewayne Bunting.  Defibrillator threshold  study less than or equal to 15 joules.   BRIEF HISTORY:  [name] is a 60 year old male recently admitted to  [facility], January 4 to January 9.  At that time he  was complaining of chest pain.  He was found to have mild acute on  chronic New [place] Association class II congestive heart failure.  He had  successful IV diuretic therapy.   PAST MEDICAL HISTORY:  Significant for:  1. 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  He has an non-      ischemic cardiomyopathy.  He has refused cardioverter defibrillator      in the past.  He has a history of non-sustained ventricular      tachycardia, hypertension, dyslipidemia and sarcoidosis.  During      this hospitalization on January 4, an ARB was substituted for ACE I      secondary to cough.  He was seen by Dr. Lewayne Bunting  January 11, 2007.  [name] suggested cardioverter defibrillator and plans      for prophylaxis against sudden cardiac death.  ICD was planned for      January 14, 2007.   HOSPITAL COURSE:  The patient presented January 14, 2007.  He underwent  successful implant of single-chamber cardioverter defibrillator.  He has  only complaint of soreness at the incision, which will be treated with  oral analgesics.  His device has been interrogated.  All values within  normal limits.  Chest x-ray shows that the lead is in appropriate  position.  There is no pneumothorax and the lungs are without congestive  heart failure.  There is dilated cardiomyopathy.  His mobility and  incision care has been discussed with the patient.  He discharges  January 13 on:  1. Diovan 320 mg daily.  2. Coreg 12.5 mg twice daily, this is a new dose given to him January      9 by Dr. [name].  3. BiDil 1 tab 3 times daily.  4. Digoxin 0.25 mg daily.  5. Aldactone 25 mg daily.  6. Lasix 40 mg daily.  7. Zithromax 250 mg daily.  8. Lipitor 10 mg daily at bedtime.   He is asked to keep his incision dry for the next 7 days, sponge bathe  until Monday, January 19.  Followup with [facility], [address]  1. ICD clinic Wednesday, January 28 at 9:40.  2. See [name] Thursday, April 23 at 11 o'clock.   LABORATORY STUDIES:  For this admission:  Digoxin level 0.5.  Serum  electrolytes on January 12, the day of admission:  Sodium 138, potassium  4.3, chloride 102, carbonate 28, BUN 35, creatinine 1.5, free glucose  78.  This basic metabolic profile was obtained because his creatinine  had risen during his hospital stay from January 4 to January 9.  On  January 9, his creatinine was 2.05, on January 12 2.53.  Other  laboratory studies:  Complete blood count:  White cells 6.6, hemoglobin  15.3, hematocrit 44, platelets 251.  Cholesterol 179, triglycerides 126,  HDL 70, LDL 84.  Troponin I studies  during the admission for congestive  heart failure, 0.05 and 0.03, 0.04.      Maple Mirza, PA     GM/MEDQ  D:  01/15/2007  T:  01/15/2007  Job:  [id number]   cc:   Doylene Canning. Ladona Ridgel, MD  Eduardo Osier. [name], M.D.

## 2010-05-17 NOTE — Discharge Summary (Signed)
[name], [name]             ACCOUNT NO.:  [id number]   MEDICAL RECORD NO.:  [id number]          PATIENT TYPE:  INP   LOCATION:  1423                         FACILITY:  [facility]   PHYSICIAN:  Mohan N. [name], M.D. DATE OF BIRTH:  1950-12-25   DATE OF ADMISSION:  01/06/2007  DATE OF DISCHARGE:  01/11/2007                               DISCHARGE SUMMARY   ADMITTING DIAGNOSES:  1. Atypical chest pain.  2. Congestive heart failure.  3. Nonischemic dilated cardiomyopathy.   FINAL DIAGNOSES:  1. Compensated congestive heart failure.  2. Nonischemic severe dilated cardiomyopathy, status post nonsustained      ventricular tachycardia.  3. Hypertension.  4. Mild renal insufficiency.  5. Hypercholesterolemia.  6. History of sarcoidosis.   DISCHARGE HOME MEDICATIONS:  1. Diovan 320 mg one tablet daily.  2. Coreg 12.5 mg one tablet every 12 hours.  3. BiDil 1 tablet three times a day.  4. Digoxin 0.25 mg one tablet daily.  5. Aldactone 25 mg one tablet daily.  6. Lasix 40 mg one tablet daily.  7. Zithromax 250 mg one tablet daily x3 days.  8. Lipitor 10 mg one tablet daily.   DIET:  Low salt, low cholesterol.  The patient has been advised to  restrict fluid to 1 liter per day and monitor his weight every day.   ACTIVITY:  As tolerated.   The patient is advised to follow up with [name] on Monday at [facility] and with me in one week.   CONDITION ON DISCHARGE:  Stable.   HISTORY:  Mr. Paskett is a 60 year old black male with a past medical  history significant for severe nonischemic dilated cardiomyopathy,  congestive heart failure, hypertension, hypercholesterolemia,  sarcoidosis.  He was admitted by Dr. Shana Chute on January 06, 2007 because  of chest pain.  The pain was localized without radiation and was noted  to be in mild congestive heart failure.   PAST MEDICAL HISTORY:  As above.   PAST SURGICAL HISTORY:  Back surgeries in the past.   No known drug  allergies.   HOME MEDICATIONS:  He was on:  1. Coreg 25 mg one tablet every 12 hours.  2. Altace 5 mg daily.  3. Digoxin 0.25 mg daily.  4. Lasix 40 mg daily.  5. Aldactone 25 mg daily.  6. Lipitor 10 mg daily.   SOCIAL HISTORY:  He is married and has 3 children.  No history of  smoking or alcohol abuse.  History of doing cocaine 7-8 years ago, quit  in 2004.   FAMILY HISTORY:  Father is alive, also has nonischemic dilated  cardiomyopathy.  He is hypertensive and has CA of the prostate.  Mother  is alive.  She has hypertension.  One sister and one brother in good  health.   PHYSICAL EXAMINATION:  GENERAL:  He was alert, awake, oriented in mild  respiratory distress.  VITAL SIGNS:  His blood pressure was 168/110, pulse 91, temperature  97.9.  NECK:  Supple.  No JVD.  LUNGS:  Bibasilar rales.  CARDIOVASCULAR:  S1 S2 normal.  Soft systolic murmur and  S3 gallop.  EXTREMITIES:  There is no clubbing, cyanosis, or edema.   LABORATORY:  A chest x-ray showed cardiomegaly with a question of mild  vascular congestion.  His BNP was 1280.  BNP on January 5, was 438.  Today BNP is 52.  His 2 sets of cardiac enzymes are negative.  His dig  level was 7.08.  CBC 14.9, hematocrit 43.2, white count of 5.8.  His  potassium is 3.7, BUN 28, creatinine 1.85, glucose is 90.   HOSPITAL COURSE:  The patient was admitted to a telemetry unit.  Initially, he was started on subcutaneous Lovenox and serial enzymes and  EKG was obtained.  The patient was ruled out for MI.  The patient did  not have any episodes of chest pain during the hospital stay.  The  patient had an episode of epistaxis.  Lovenox was discontinued.  The  patient was started on IV diuretics with good diuresis and control of  his blood pressure.  The patient was started on ARB and ACE inhibitors  were held because of coughing.  The patient's renal function remained  stable.  His creatinine has slightly gone up.  We will cut down the  dose  of diuretics, and probably his  renal insufficiency is attributed to  hypotension and hypovolemia.   The patient will be discharged home on the above medications and will be  followed up in my office in 1 week.  The patient will be scheduled for  ICD as an outpatient early next week.  We will check his BMP in 1 week.      Eduardo Osier. [name], M.D.  Electronically Signed     MNH/MEDQ  D:  01/11/2007  T:  01/11/2007  Job:  [id number]   cc:   Dr Juline Patch Cardiology

## 2010-05-17 NOTE — H&P (Signed)
[name], STOCKHAUSEN             ACCOUNT NO.:  [id number]   MEDICAL RECORD NO.:  [id number]          PATIENT TYPE:  INP   LOCATION:  1404                         FACILITY:  [facility]   PHYSICIAN:  [name], MD    DATE OF BIRTH:  19-Oct-1950   DATE OF ADMISSION:  01/27/2008  DATE OF DISCHARGE:                              HISTORY & PHYSICAL   CARDIOLOGIST:  Rosine Abe, M.D., [name].   PRIMARY CARE PHYSICIAN:  The patient has no primary care physician.   CHIEF COMPLAINT:  Chest pain.   HISTORY OF THE PRESENT ILLNESS:  The patient is a 60 year old male with  a history of nonischemic cardiomyopathy.  Echocardiogram performed in  January 2009 showed an ejection fraction of 10-15% with moderate RV  systolic dysfunction and a left ventricular end-diastolic dimension of  56 mm.  He status post AICD and negative course following cath in April  2008.  He presents with substernal chest pain.  He notes he has been  doing well over the last 3 months ago.  Apparently 3 months ago he was  hospitalized in [facility].  He notes that he had a myocardial  infarction.  The patient states he had a cardiac catheterization there  that was negative, however.  He probably was admitted for decompensated  heart failure; but, since then over the last few months he has done well  until yesterday where he notes chest tightness, substernal, severe  associated with sweats and arm numbness.  The patient last night had  significant orthopnea and shooting chest pains to the neck.  He also  notes recent weight gain; he is usually stable in the range of 232  pounds and he is now 242 pounds per the patient as he does weigh himself  frequently; and, he is also noted the sensation of bloating and  decreased appetite, no lower extremity edema and no paroxysmal nocturnal  dyspnea.  In the emergency room he was given aspirin 162 mg and morphine  4 mg IV, which helped the pain.   PAST  MEDICAL AND SURGICAL HISTORY:  The patient's past medical history  and PAS surgical history are as above.  He also is a history of:  1. Hypertension.  2. Hyperlipidemia.  3. History of chronic headaches.  4. History of gastroesophageal reflux disease.  5. He has a history of polysubstance abuse, but known since 2004.   ALLERGIES:  THE PATIENT IS ALLERGIC TO INDOMETHACIN, WHICH CAUSES  NAUSEA, VOMITING AND DIARRHEA.   MEDICATIONS:  The medications the patient is on include:  1. Coreg 25 mg twice a day.  2. Digoxin 0.125 mg daily.  3. Spironolactone 25 mg daily.  4. Hydralazine 25 mg twice a day.  5. Diovan 40 mg daily.  6. Lasix 60 mg twice a day.  7. Protonix 40 mg daily.  8. Aspirin 81 mg daily.   SOCIAL HISTORY:  The patient is unemployed and currently is on  disability.  No history of tobacco or alcohol.  History of drug use in  the past as above.   FAMILY HISTORY:  The family history is positive for coronary artery  disease, congestive heart failure and stroke.   REVIEW OF SYSTEMS:  The 14-point review of systems is negative unless  stated above.   PHYSICAL EXAMINATION:  VITAL SIGNS:  Temperature is 98 with a blood  pressure of 165/74, respiratory rate of 18, and sat is 97% on room air.  The patient notes his weight as above.  GENERAL APPEARANCE:  In general he appears comfortable and does not  appear to be chronically ill.  HEENT:  Head is normocephalic and atraumatic.  Pupils are equal, round  and react to light with the extraocular movements being intact.  Oropharynx reveals poor dentition, but no posterior pharyngeal lesions.  NECK:  The neck is supple with no lymphadenopathy or thyromegaly.  He  has jugulovenous distention to the jaw.  No carotid bruits.  LUNGS:  The lungs are clear to auscultation bilaterally.  HEART:  Cardiovascular exam reveals a displacement of his point of  maximal impulse leftward, no S3 noted however, and irregular not fast or  slow.   ABDOMEN:  The patient's abdomen is full, nondistended with no  hepatosplenomegaly.  He does have hepatojugular reflux.  The patient is  nontender in the abdomen.  EXTREMITIES:  The extremities show no clubbing, cyanosis or edema.  SKIN:  The skin shows no rashes.  NEUROLOGIC:  On neurologic exam he is alert and oriented x4.  Cranial  nerves II-XII are grossly intact.  Sensation is grossly intact.   LABORATORY DATA:  The laboratory data showed a digoxin level of less  than 0.2.  White count is 4 with a hemoglobin of 14.2, hematocrit 41.5,  platelets of 189,000 with segs of 40% and leukocytes of 29%.  Sodium was  139 with a potassium 4.1, chloride 109, bicarb 24, BUN 12, creatinine  1.32, and glucose 91.  BNP was 614 (this the B-natriuretic peptide).  The B-natriuretic peptide in August 2009 was 276.  The patient's chest x-  ray showed no acute cardiopulmonary disease.  There was some mild  cardiomegaly and the AICD was in place.  The patient's cardiac markers  point care at1 9:08 and 20:13 were negative.  EKG showed normal sinus  rhythm with a vent rate of 74 beats/minute, left ventricular hypertrophy  and left axis deviation noted, PR interval was 188, QRS 106 and QT  corrected at 479.   ASSESSMENT AND PLAN:  This is a patient with a history of significant  systolic heart failure who presents with acute on chronic systolic heart  disease decompensation.  The patient will be admitted to a telemetry bed  and we will draw cardiac markers x3, and get an electrocardiogram in  morning, although his ischemic rule out may be low yield.  Primarily we  will try to diurese him, vasodilate him and decrease his afterload for  his heart failure.  He will be given Lasix 80 mg intravenously now as  well as nitrates and we will follow his urine output closely.  Strict  ins and outs, 2 grams sodium diet with fluid restriction.  His weights  will be checked daily.  We will also get a D-dimer to rule out   pulmonary embolus as an exacerbater of this or another alternative  explanation for his symptoms.  I suspect that his decompensation is due  to dietary indiscretion and possibly a nonadherence of medications as  his digoxin level is low.      [name], MD  Electronically  Signed     DDP/MEDQ  D:  01/27/2008  T:  01/28/2008  Job:  [id number]

## 2010-05-17 NOTE — Discharge Summary (Signed)
[name], Darrell             ACCOUNT NO.:  [id number]   MEDICAL RECORD NO.:  [id number]          PATIENT TYPE:  INP   LOCATION:  1426                         FACILITY:  [facility]   PHYSICIAN:  Elmore Guise., M.D.DATE OF BIRTH:  18-Sep-1950   DATE OF ADMISSION:  08/05/2007  DATE OF DISCHARGE:  08/09/2007                               DISCHARGE SUMMARY   DISCHARGE DIAGNOSES:  1. Congestive heart failure exacerbation.  2. History of nonischemic dilated cardiomyopathy (ejection fraction 10-      15%).  3. Hypertension.  4. Dyslipidemia.  5. Gastroesophageal reflux disease.   HISTORY OF PRESENT ILLNESS:  [name] is a 60 year old African  American male who presented with increasing dyspnea on exertion, weight  gain, orthopnea and mild lower extremity edema.  He was admitted for  treatment of volume overload and CHF.   HOSPITAL COURSE:  The patient's hospital course was uncomplicated.  We  maximized his Coreg as well as increase his hydralazine to help with  afterload reduction.  He was treated with IV Lasix 80 mg q.8 hours  initially decreasing down to q.12 hours then changing to p.o. Lasix 120  mg in the morning and 80 at night.  He had excellent diuresis minus 3-4  liters daily.  His BNP on admission was 2200 and decrease down to 200 on  discharge.  His creatinine did peak at 2.1; however, on day of discharge  was 1.9.  He has had no further problems with orthopnea or PND.  He has  been up and ambulatory with no significant shortness of breath.   He will be discharged home to continue the following medications:  1. Coreg 25 mg twice daily.  2. Digoxin 0.125 mg daily.  3. Spironolactone 25 mg daily.  4. Hydralazine 50 mg 2 times daily.  5. Altace 5 mg daily.  6. Lasix 40 mg 3 tablets in the morning, 2 tablets at night.  7. Protonix 40 mg daily.  8. Aspirin 81 mg daily.  He is to stop his Diovan, Zocor and nitroglycerin patch.  His statin was  stopped because of muscle  aches and pains as well as pain around his  liver.   He had an abdominal ultrasound which showed mild thickening in the  gallbladder wall; however, no stones.  The IVC, pancreas, spleen all  appear normal.  The aorta was normal.  His pain and liver congestion was  likely secondary to his CHF.  He is to continue CHF restrictions of less  than 2 liters of fluid intake per day.  He is to do daily weights.  If he has any trouble with orthopnea or increasing shortness of breath,  he is to give me a call.  At that time I would like him to increase his  Lasix up to 120 mg twice daily.  Otherwise his followup appointment will  be with Dr. Reyes Ivan in 2 weeks.      Elmore Guise., M.D.  Electronically Signed     TWK/MEDQ  D:  08/09/2007  T:  08/09/2007  Job:  [id number]

## 2010-05-17 NOTE — Discharge Summary (Signed)
NAMEJUN, OSMENT             ACCOUNT NO.:  [id number]   MEDICAL RECORD NO.:  [id number]          PATIENT TYPE:  INP   LOCATION:  5524                         FACILITY:  [facility]   PHYSICIAN:  Thomas C. Wall, MD, FACCDATE OF BIRTH:  09-29-1950   DATE OF ADMISSION:  01/17/2007  DATE OF DISCHARGE:  01/19/2007                               DISCHARGE SUMMARY   PRIMARY CARDIOLOGIST:  Eduardo Osier. [name], M.D., EP is Dr. Lewayne Bunting.   DISCHARGE DIAGNOSES:  Generalized chest discomfort with increased cough  in the setting of recent implantation of a single chamber ICD on  December 14, 2007.  The patient's symptoms were felt most likely  secondary to bronchitis.   PAST MEDICAL HISTORY:  1. Includes nonischemic cardiomyopathy with EF of 15%, status post      implantable a St Jude current single chamber cardioverter      defibrillator.  2. Hypertension.  3. Dyslipidemia.  4. Sarcoidosis.  5. Recent upper respiratory infection.  6. History of nonsustained VTach.   HOSPITAL COURSE:  Mr. Hauk was just discharged here on January 13,  status post implant of his ICD.  Saw [name] in the office for EP  followup appointment January 15 as a walk in.  He complained of  generalized chest discomfort with increased cough.  When the patient had  been admitted on December 12 he was already on Zithromax for upper  respiratory infection.  The patient had no problems during implant of  his device.  He was discharged home in stable condition on January 13.  When he presented to the office on January 15 chest x-ray showed slight  right lower lobe atelectasis.  The patient was afebrile but sent to  [facility] for further followup.  On the 16th the patient complained of  some difficulty swallowing.  WBC within normal limits, H and H normal,  felt to be pleuritic chest discomfort in the setting of recent  bronchitis.  The patient underwent 2-D echocardiogram on January 16.  No  signs of pericardial  effusion, EF of 10-15% without change.  Dr. Daleen Squibb in  to see the patient on day of discharge.  The patient is still  complaining of difficulty swallowing and mild chest discomfort  generalized, worse with coughing.  Enzymes were negative.  Afebrile.  The patient is being treated with amoxicillin.  If no further problems  will plan on discharging him home this evening tentatively around 5 p.m.  The patient is instructed to continue his medications as prescribed at  discharge on the 13th.   MEDICATIONS:  1. This includes Diovan 320.  2. Coreg 12.5 b.i.d.  3. BiDil t.i.d.  4. Digoxin 0.25 daily.  5. Aldactone 25 daily.  6. Lasix 40 daily.  7. Lipitor daily.  8. He has been given a prescription for amoxicillin 500 mg t.i.d. for      5 days.  9. He may use Naprosyn (Aleve) one every 12 hours as needed for      discomfort.   Followup and wound care as previously prescribed on January 15, 2007, in  [place]  thorough note.   Discharge time less than 30 minutes.      Dorian Pod, ACNP      Jesse Sans. Daleen Squibb, MD, [facility]  Electronically Signed    MB/MEDQ  D:  01/19/2007  T:  01/19/2007  Job:  [id number]   cc:   Eduardo Osier. [name], M.D.

## 2010-05-17 NOTE — Op Note (Signed)
[name], [name]             ACCOUNT NO.:  [id number]   MEDICAL RECORD NO.:  [id number]          PATIENT TYPE:  INP   LOCATION:  1404                         FACILITY:  [facility]   PHYSICIAN:  Shirley Friar, MDDATE OF BIRTH:  02/20/1950   DATE OF PROCEDURE:  DATE OF DISCHARGE:  02/05/2008                               OPERATIVE REPORT   PROCEDURE:  Upper endoscopy.   INDICATIONS:  Epigastric pain, dysphagia.   MEDICINES:  Fentanyl 100 mcg IV, Versed 10 mg IV.   FINDINGS:  The endoscope was inserted to the oropharynx.  Esophagus was  intubated which was normal in its entirety.  There was no evidence of  esophageal stricture or esophageal ulcer seen.  The scope was easily  passed down into the stomach where a moderate amount of liquid and food  particles were seen in dependent portion of the fundus.  Retroflexion  was done which revealed a small hiatal hernia.  The scope was  straightened and advanced down to the distal part of stomach where there  were a few small benign appearing gastric antral nodules with yellowish  overlying mucosa.  The surrounding mucosa was normal in appearance.  The  endoscope was advanced through the pyloric channel into the duodenal  bulb and second portion of the duodenum which were both normal.  The  endoscope was withdrawn back into the stomach and a biopsy of one these  nodules was obtained for histology.  A random biopsy was also taken of  the gastric antral mucosa to check for histology and H pylori.  The  endoscope was then withdrawn to confirm the above findings.   ASSESSMENT:  1. Gastric antral nodule, status post biopsy.  Suspect benign reactive      tissue.  Other possibilities include carcinoid.  2. Small hiatal hernia.  3. No endoscopic evidence of esophageal stricture or peptic ulcer      disease.   PLAN:  1. Follow-up on path.  2. Heart-healthy diet.  3. Change proton pump inhibitor to Prilosec 2 pills once a day.  4. No  further inpatient workup.  5. The patient needs to have an outpatient screening colonoscopy at      some point in the near future.      Shirley Friar, MD  Electronically Signed    VCS/MEDQ  D:  02/04/2008  T:  02/05/2008  Job:  [id number]   cc:   Elmore Guise., M.D.  Fax: [phone]

## 2010-05-17 NOTE — Discharge Summary (Signed)
NAMEKENITH, TRICKEL             ACCOUNT NO.:  [id number]   MEDICAL RECORD NO.:  [id number]          PATIENT TYPE:  INP   LOCATION:  1404                         FACILITY:  [facility]   PHYSICIAN:  Elmore Guise., M.D.DATE OF BIRTH:  02-25-1950   DATE OF ADMISSION:  01/27/2008  DATE OF DISCHARGE:  02/05/2008                               DISCHARGE SUMMARY   DISCHARGE DIAGNOSES:  1. History of dilated nonischemic cardiomyopathy.  2. Recent viral syndrome.  3. Gastroesophageal reflux disease (status post      esophagogastroduodenoscopy during this hospitalization).  4. Hypertension.   HISTORY OF PRESENT ILLNESS:  Mr. Lindstrom is a 60 year old gentleman who  was admitted with chest pain and CHF exacerbation.   HOSPITAL COURSE:  Patient's hospital course was uncomplicated.  He was  noted to have a viral syndrome versus possible upper respiratory tract  infection and was started on azithromycin.  He had a fever up to 100.8  for approximately 48 hours which resolved after 24 hours of p.o.  antibiotics.  He had been up and ambulatory but continued to have off  and on epigastric pain, dysphasia, and GI burning.  A GI consult was  obtained.  Patient then underwent EGD showing no ulcer, stricture, or  web.  He was noted to have a nodule which was biopsied.  It was  recommended at that time to treat him with Prilosec.  Patient has  tolerated a normal diet for the last 5 days without any problems with  nausea, vomiting, or diarrhea.  He has had no further trouble with chest  pain or shortness of breath.  His GI discomfort resolved after his EGD.  He will be discharged home today to continue the following medications:  1. Coreg 25 mg twice daily.  2. Lasix 80 mg daily.  3. Protonix 40 mg daily until he finishes his current supply then      changing to Prilosec 40 mg daily.  4. Hydralazine 25 mg daily.  5. Digoxin 0.125 mg daily.  6. Patient did finish a 7-day course of azithromycin and  therefore      does not need any further antibiotics.  7. He will also take spironolactone 25 mg daily.  8. Mucinex on a p.r.n. basis.   He should have heart failure restrictions including performing daily  weights.  Should his weight increase more than 1 to 2 pounds in 24 hours  or 3 to 4 pounds in 48 hours, he is to increase his Lasix up to twice  daily.  He did have pain when he took his Diovan therefore this was  stopped.  I discussed his heart failure restrictions with him at  length.  He understands that he needs to take in less than 2 L of fluid  intake per day.  He will call the office if he has any further problems.  I plan to see him back here in the office in 2 weeks.  He will follow up  with Dr. Bosie Clos with [facility] in 4 weeks.      Elmore Guise., M.D.  Electronically Signed  TWK/MEDQ  D:  02/05/2008  T:  02/05/2008  Job:  [id number]

## 2010-05-17 NOTE — Consult Note (Signed)
NAMELOREN, SAWAYA             ACCOUNT NO.:  [id number]   MEDICAL RECORD NO.:  [id number]          PATIENT TYPE:  INP   LOCATION:  1404                         FACILITY:  [facility]   PHYSICIAN:  Shirley Friar, MDDATE OF BIRTH:  1950-07-07   DATE OF CONSULTATION:  02/04/2008  DATE OF DISCHARGE:                                 CONSULTATION   REQUESTING PHYSICIAN:  Dr. Lady Deutscher   REASON FOR CONSULT:  Dysphagia, epigastric abdominal pain.   HISTORY OF PRESENT ILLNESS:  Mr. Tebbetts is a 60 year old black male who  was admitted on January 27, 2008, secondary to chest pain and was  treated for congestive heart failure exacerbation.  Patient was set up  to go home yesterday after resolution of his CHF exacerbation, but then  he started having some abdominal pain and indigestion-type symptoms.  He  was given  a GI cocktail for resolution.  He reports a 2-year history of  solid food dysphagia and intermittent epigastric pain.  He feels like it  is difficult to swallow, and it has worsened over the last couple of  years.  He denies any trouble swallowing liquids.  He also describes a  burning in his chest and epigastric discomfort.  He gives a remote  history of stomach ulcers and has been on Tagamet in the past.  He  denies any regular NSAID use.  The epigastric discomfort and indigestion  he had yesterday were relieved with a GI cocktail.  He takes  pantoprazole at home but says that does not help with his burning  sensation.   PAST MEDICAL HISTORY:  1. Coronary artery disease.  2. Congestive heart failure.  3. Status post AICD and pacemaker placement.  4. Hypertension.  5. Hyperlipidemia.  6. History of reflux as stated above.  7. History of chronic headaches.  8. History of polysubstance abuse.   CURRENT MEDICINES:  Tylenol, Xanax, aspirin, Zithromax, Coreg, Flonase,  furosemide, hydralazine, Claritin.   ALLERGIES:  INDOMETHACIN.   SOCIAL HISTORY:  He denies alcohol,  history of drug abuse per chart  history.   PHYSICAL EXAMINATION:  VITAL SIGNS:  Temperature 98.1, pulse 72, blood  pressure 114/83, O2 saturation 98% on room air.  GENERAL:  Alert, in no acute distress.  ABDOMEN:  Minimal tenderness in epigastric region, otherwise nontender,  soft, nondistended, positive bowel sounds.   IMPRESSION:  A 60 year old black male with 2 years of progressive solid  food dysphagia and intermittent epigastric pain concerning for peptic  ulcer disease versus gastroesophageal reflux disease.  I doubt he has an  esophageal stricture or esophageal ring based on his presentation.  I  would recommend an upper endoscopy to  further evaluate his symptoms prior to discharge with possible  esophageal dilation.  I discussed the risks and benefits of upper  endoscopy, and he agrees to proceed.  He will most likely need to be on  continued antireflux medicine depending on the results of his upper  endoscopy.      Shirley Friar, MD  Electronically Signed     VCS/MEDQ  D:  02/04/2008  T:  02/04/2008  Job:  [id number]   cc:   Elmore Guise., M.D.  Fax: [phone]

## 2010-05-17 NOTE — Op Note (Signed)
NAMEEMAN, MORIMOTO             ACCOUNT NO.:  [id number]   MEDICAL RECORD NO.:  [id number]          PATIENT TYPE:  INP   LOCATION:  2040                         FACILITY:  [facility]   PHYSICIAN:  Doylene Canning. Ladona Ridgel, MD    DATE OF BIRTH:  03/31/1950   DATE OF PROCEDURE:  01/14/2007  DATE OF DISCHARGE:                               OPERATIVE REPORT   PROCEDURE PERFORMED:  Implantation of a single chamber ICD.   INTRODUCTION:  The patient is a 60 year old male with longstanding  nonischemic cardiomyopathy, nonsustained VT and near syncope who is  admitted to the hospital with heart failure exacerbation.  He had  recurrent dizzy spells, documented nonsustained VT and is now referred  for ICD implantation.   PROCEDURE:  After informed was obtained, the patient was taken to the  diagnostic catheterization lab in the fasting state.  After the usual  preparation and draping, intravenous fentanyl and midazolam was given  for sedation.  Lidocaine 30 mL was infiltrated in the left  infraclavicular region.  A 7 cm incision was carried out over this  region.  Electrocautery utilized to dissect down to the fascial plane.  The left subclavian vein was then punctured and the St. Jude model 7121  65 cm active fixation defibrillation lead serial number [id number] was  advanced into the right ventricle.  Mapping was carried out at the final  site.  The R-waves measured 23 mV and impedance was 612 ohms, threshold  0.7 volts at 0.5 milliseconds and 10 volts pacing did not stimulate the  diaphragm.  With the ventricular lead in satisfactory position, it was  secure the subpectoralis fascia with a figure-of-eight silk suture and  the sewing sleeve also secured with silk suture.  Electrocautery was  utilized to make a subcutaneous pocket.  Kanamycin irrigation was  utilized to irrigate the pocket.  Electrocautery was utilized to assure  hemostasis.  The St. Jude current VRRF model 1207-36 single chamber  defibrillator was connected to the defibrillation lead and placed back  in the subcutaneous pocket and secured with silk suture.  The pocket was  again irrigated with Kanamycin and the incision closed with a layer of 2-  0 Vicryl, followed by layer of 3-0 Vicryl.  Benzoin was painted on the  skin, Steri-Strips were applied and pressure dressing was placed.  The  patient was returned to his room in satisfactory condition.   COMPLICATIONS:  There were no immediate procedure complications.   RESULTS:  This demonstrates successful implantation of a St. Jude single  chamber defibrillator in a patient with nonischemic cardiomyopathy,  congestive heart failure class II and nonsustained ventricular  tachycardia associated with near syncope.      Doylene Canning. Ladona Ridgel, MD  Electronically Signed     GWT/MEDQ  D:  01/14/2007  T:  01/15/2007  Job:  [id number]   cc:   Eduardo Osier. [name], M.D.

## 2010-05-17 NOTE — Discharge Summary (Signed)
[name], [name]             ACCOUNT NO.:  [id number]   MEDICAL RECORD NO.:  [id number]          PATIENT TYPE:  INP   LOCATION:  1440                         FACILITY:  [facility]   PHYSICIAN:  Cassell Clement, M.D. DATE OF BIRTH:  20-Oct-1950   DATE OF ADMISSION:  06/30/2007  DATE OF DISCHARGE:  07/02/2007                               DISCHARGE SUMMARY   FINAL DIAGNOSIS:  1. 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  2. Exacerbation of congestive heart failure.  3. Status post automatic implantable cardioverter defibrillator      implant.  4. Chronic headaches.  5. Dyslipidemia.  6. Hypertensive cardiovascular disease.  7. Chest pain, myocardial infarction ruled out.   OPERATIONS PERFORMED:  None.   HISTORY:  This 60 year old African American male was in his usual state  of health until four days prior to admission when he developed weakness  and increasing shortness of breath.  He had been having a lot of  headaches and had been using a lot of BC powders at the time.  He noted  evidence of fluid retention, shortness of breath and some mid sternal  chest tightness without radiation.  He had been hospitalized in April  2009 for exacerbation of CHF and he has been followed in the office by  Dr. Reyes Ivan.  He has had his Lasix dose adjusted.   EXAM:  His blood pressure is 132/81, heart rate 67, O2 saturation 96% on  room air.  LUNGS:  Are essentially clear except for a few crackles at the bases.  HEART:  Reveals no gallop.  ABDOMEN:  Nontender.  AICD site looks fine.  EXTREMITIES:  Show no phlebitis.   LAB DATA:  Include a B-natriuretic peptide of 601, sodium 140, potassium  of 3.8, BUN 19, creatinine 1.6, hemoglobin 14, white count 4600.  Chest  X-ray showed cardiomegaly but no acute disease and no radiographic  evidence of CHF.  EKG showed sinus rhythm with LVH by voltage with left  anterior hemi-block and an incomplete right bundle branch  block with T-  wave inversions V3-V6.   HOSPITAL COURSE:  The patient was admitted to Dr. Silva Bandy service by  Dr. Mayford Knife, he was treated with IV Lasix.  He was continued on his home  medication.  He ruled out for an acute myocardial infarction by enzymes.  B-natriuretic peptide improved in the hospital.  Telemetry showed normal  sinus rhythm.  Blood pressure remained normal.  By July 02, 2007 the  patient was stable from the cardiac standpoint.  He continued to have  headaches and on the outpatient basis he is followed by a headache  specialist.  He requires Zofran IV for nausea.  On the day of discharge  blood pressure is 128/89, pulse 66, his B-natriuretic peptide was down  to 455, potassium 3.9, BUN 23, creatinine 1.56.   The patient is being discharged on the following regimen:  He will be on  a low-sodium heart-healthy diet.  He is to see Dr. Lady Deutscher in one  week and he is to call the office for  an appointment.   DISCHARGE MEDICATIONS:  1. Diovan 321 daily.  2. Coreg 12.5 mg b.i.d.  3. Digoxin 0.25 mg daily.  4. Spironolactone 25 mg daily.  5. Lipitor 10 mg daily.  6. Lasix 120 mg twice a day, and he may use extra Lasix p.r.n.  7. Altace 5 mg daily.  8. Tylenol as needed for headache.  9. Phenergan 25 mg p.o. if needed for nausea.  10.He was advised to avoid large doses of BC powders because this      could exacerbate his cardiac condition.   CONDITION ON DISCHARGE:  Improved.           ______________________________  Cassell Clement, M.D.     TB/MEDQ  D:  07/02/2007  T:  07/02/2007  Job:  [id number]   cc:   Elmore Guise., M.D.  Fax: [phone]

## 2010-05-17 NOTE — H&P (Signed)
[name], [name]             ACCOUNT NO.:  [id number]   MEDICAL RECORD NO.:  [id number]          PATIENT TYPE:  INP   LOCATION:  1426                         FACILITY:  [facility] [place]   PHYSICIAN:  Elmore Guise., M.D.DATE OF BIRTH:  Feb 21, 1950   DATE OF ADMISSION:  08/05/2007  DATE OF DISCHARGE:                              HISTORY & PHYSICAL   INDICATION FOR ADMISSION:  Congestive heart failure exacerbation.   HISTORY OF PRESENT ILLNESS:  [name] is a very pleasant 60 year old  African American male with past medical history of dilated  cardiomyopathy (EF of 10%), dyslipidemia, hypertension, remote history  of substance abuse who presents for evaluation of orthopnea and  increased dyspnea.  The patient reports over the last 3-4 days he has  been having progressive dyspnea on exertion, weight gain (5 pounds),  orthopnea, PND and now some burning in his chest.  He has increased his  Lasix up to help him with his swelling.  Unfortunately, he has seen no  improvement in his urine output.  He had to stop his nitroglycerin patch  because of headaches.  He does report increased stress which likely is  an exacerbator for his current CHF exacerbation.  He states that he has  been up at the hospital off-and-on for the last week because his father  was admitted with pneumonia.  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  While at the hospital, he was off of his normal schedule,  having difficulty with his sleep.  He denies any salt intake.  He does  report compliance with his medications.  He has not used any illegal  substances.  He has watched his fluid intake, but feels like his  medications just aren't working.  He denies any discharges from his  device.  He has had no fever, chills, nausea, vomiting or diarrhea.   REVIEW OF SYSTEMS:  Are otherwise negative.   CURRENT MEDICATIONS:  1. Diovan 320 mg daily.  2. Coreg 12.5 mg twice daily.  3. Digoxin 0.125 mg daily.  4.  Aldactone 25 mg daily.  5. Lipitor 10 mg daily.  6. Lasix 160 mg twice daily.  7. Altace 5 mg daily.  8. Hydralazine 25 mg twice daily.  9. Baclofen p.r.n.   ALLERGIES:  TO INDOMETHACIN CAUSING NAUSEA, VOMITING, DIARRHEA.   FAMILY HISTORY:  Is positive for heart failure, coronary disease and  stroke.   SOCIAL HISTORY:  He is unemployed.  No tobacco or alcohol use.  He  drinks 1 to 2 soft drinks daily.   PHYSICAL EXAMINATION:  Weight is 237 up from 232.  His blood pressure is  142/100.  His heart rate is 96 and regular.  In general, he is very pleasant middle-aged [place] American male alert  and oriented x4.  No acute distress.  HEENT:  Is normal.  NECK:  Supple.  No lymphadenopathy, 2+ carotids.  No JVD.  No bruits.  LUNGS:  Have coarse breath sounds with bibasilar crackles.  HEART:  Is regular with S3-S4 gallop noted.  ABDOMEN:  Is soft, nontender, nondistended.  No rebound or guarding.  He  does have a hepatomegaly with liver felt 5 fingerbreadths below the  right costal margin.  He is mildly tender there.  EXTREMITIES:  Warm with trace pretibial edema.   His most recent blood work was done July 14 showing a BNP of 511.  This  was down from 935 earlier.  He also had a BUN and creatinine of 19 and  1.3.  He had a urine sodium of 242.  His blood work is still pending at  time of dictation now.  His defibrillator was interrogated to evaluate  for any arrhythmias.  He has had no tachyarrhythmias noted.  His  threshold is stable at 1.25 volts at 0.5 milliseconds.  His sensing is  normal.  He has had no discharges from his device.   IMPRESSION:  1. Congestive heart failure exacerbation.  2. History of dilated cardiomyopathy (ejection fraction 10%).  3. Hypertension.  4. Dyslipidemia.   PLAN:  The patient will be admitted for IV diuresis.  We will restart  his nitroglycerin patch.  I plan to increase his Coreg up 25 mg twice  daily.  I will stop his ACE inhibitor and continue  his Diovan for now.  I would also increase his hydralazine to 50 mg twice daily.  We will get  a right upper quadrant ultrasound as well as check his LFTs.  Unless he  has any further problems, further recommendations pending his blood work  and response to diuresis.  The patient has now had multiple cardiac  admissions after he has improved.  He will likely need referral to the  transplant center since he is essentially failing medical therapy.      Elmore Guise., M.D.  Electronically Signed     TWK/MEDQ  D:  08/05/2007  T:  08/05/2007  Job:  [id number]

## 2010-05-17 NOTE — Consult Note (Signed)
[name], [name]             ACCOUNT NO.:  [id number]   MEDICAL RECORD NO.:  [id number]          PATIENT TYPE:  EMS   LOCATION:  MAJO                         FACILITY:  [facility]   PHYSICIAN:  Darrell Mirza, PA   DATE OF BIRTH:  09-10-1950   DATE OF CONSULTATION:  DATE OF DISCHARGE:                                 CONSULTATION   ALLERGIES:  This patient has no known drug allergies.   TIME FOR THIS CONSULTATION AND DICTATION:  Greater than 55 minutes.   BRIEF HISTORY OF PRESENTING CIRCUMSTANCE:  My head hurts so badly and  my chest is on fire when I cough.   HISTORY OF PRESENT ILLNESS:  [name] is a 60 year old male.  He has  a history of nonischemic cardiomyopathy, nonsustained ventricular  tachycardia, and presyncope.  He was admitted in January with acute on  chronic congestive heart failure.  At that time, he had a [employer]. Jude  Current VR R single chamber cardioverter defibrillator implanted by Dr.  Ladona Ridgel on January 14, 2007.  Past medical history includes hypertension,  dyslipidemia, sarcoidosis.  We are asked to see in the emergency room  because he called our office stating that it felt as if his device was  popping out of his chest.  It was thought that perhaps the patient had a  dehiscence at the pocket of his cardioverter defibrillator.  The long  and short of this is, this is not so.  His pocket is healing nicely.  There is no abnormal swelling or erythema.   When asked if his device had ever delivered a shocking therapy, he says  that about 2 weeks after the implant he had a significant spasm.  He  saw Dr. Reyes Ivan apparently soon after.  The patient is unsure as to the  result of what sounded like an interrogation of the device in Dr.  Silva Bandy office.   TODAY, March 05, 2007:  The patient presents with a constellation of  painful symptoms:   1. For the longest, he has been plagued by headaches.  In the past,      these have been attributed to increased blood  pressure (and today      the patient's blood pressure is up).  2. She also has complaints of being beset in the last 2-3 days by:      a.     Paranasal/frontal pain.      b.     Bilateral temporal pain.      c.     Sore throat and hoarseness.      d.     Stiff neck.      e.     Pleuritic chest pain.  Terrible with cough and movement.      f.     Pain at the cardioverter defibrillator site.  Moving his       left arm gives him a pulling sensation at the ICD site.      g.     Two days of copious dark yellow nasal drainage for which       actually Dr. [name]  has prescribed an antibiotic, unknown brand       or dosage.      h.     Fevers and chills for the last 2 days.      i.     Night sweats for about one week.  Of note, this cough is not       particularly productive but, however, his sinus drainage is the       one producing symptoms.      j.     Diarrhea this morning three to four times.      k.     Bilateral knee aches and pain which started after all the       above symptoms have been existing for about 48 hours.   ALLERGIES:  The patient has no known drug allergies.   MEDICATIONS:  The patient was started on antibiotic therapy yesterday  prescribed by Dr. [name]   1. Altace 5 mg daily.  2. Coreg 12.5 mg daily.  3. Lanoxin 0.125 mg daily.  4. Lipitor 10 mg at bedtime.  5. Spirolactone 25 mg daily.  6. Diovan 320 mg daily.  Of note, the patient was seen in the      emergency room February 21, 2007.  He was seen for headache.  The      patient has no history of migraines, a CT of the head February 19      shows no acute intracranial pathology, however, there is mucosal      thickening in the paranasal sinuses consistent with chronic      inflammatory changes.   REVIEW OF SYSTEMS:  The patient is complaining of fevers and chills.  This could be subjective.  He is not feverish on vitals on today.  He is  complaining of headache, stiff neck, temporal and paranasal pain.  He   has hoarseness for about 2-3 days with sore throat, pleuritic chest pain  and diarrhea.  Also, night sweats.   PHYSICAL EXAMINATION:  VITAL SIGNS:  Temperature 97.7, pulses 81,  respirations 22, blood pressure 163/112, oxygen saturation 99% on room  air.  Later, his blood pressure had moderated to 140/82.  HEENT:  The patient is tender at the bilateral temporal zones, bilateral  paranasal sinus areas and tender at the nape of the neck.  Oropharynx is  erythematous, but there is no purulent crusting.  NECK:  Tender to palpation, especially at the nape, but there are no  bruits.  CHEST:  Lungs are clear to auscultation bilaterally.  Chest x-ray in the  emergency room today no active chest disease.  HEART:  Regular rate and rhythm.  Electrocardiogram shows sinus rhythm,  no ST - T changes.  ABDOMEN:  Bowel sounds are present, nondistended, nontender.  Once  again, the patient had diarrhea this morning two to three times.  EXTREMITIES:  No edema.   The ICD pocket has been inspected as well and by the physician assistant  and also by Dr. Juanda Chance.  Incision is well-healed.  There is no taught  swelling.  Manipulation of the ICD site only shows very minor  discomfort.  There is no erythema.   IMPRESSION:  1. Acute sinusitis, pharyngitis, pleurisy versus septic insult.  2. Hypertension will need to better control.  The patient took all      antihypertensive this morning.  3. ICD implant January 14, 2007.      a.     Nonischemic cardiomyopathy.  b.     An SVT.      c.     Presyncope .  4. New York Heart Association class II congestive heart failure with      acute on chronic admission January 2009.  5. Chronic headaches, negative CT scan February 21, 2007, on emergency      room.  Admit.  6. Sarcoidosis question latest update on this.  The patient has not      been seen for this for quite some time he says.   PLAN:  1. Culture the blood.  Nasal discharge.  A stat strip tests as well  as      urine sampling.  2. Labs are ESR, ANA and complete blood count.  3. Better blood pressure control going forward.  4. Possible device interrogation the patient claims at discharge, 2      weeks after implant.   LABORATORY STUDIES:  Laboratory studies that I know of here and done in  the emergency room, sodium is 137, potassium 4.7, chloride 110,  carbonate 31, BUN is 19, creatinine 1.3, glucose 109.  The BNP is 429, D-  dimer 0.31.  Complete blood count is in white cells 6.2, hemoglobin  14.3, hematocrit 41.9, platelets are 202.  Studies and progress are  urine culture and sensitivity, ANA and the ESR.   ASSESSMENT/PLAN:  Once again, Dr. Juanda Chance has seen this patient with me,  manipulated the pacer site.  It looks in good according to him.  No  swelling or erythema.  The patient probably has chronic sinus infection.  The patient has not started antibiotics yet.  This will continue, and  the patient will have referral to outpatient office visit with Dr.  Dillard Cannon Dr. Allene Pyo telephone number is [phone], and the  office visit is scheduled for March 12.   MEDICATIONS ON DISCHARGE FROM THE EMERGENCY ROOM:  1. Altace 5 mg daily.  2. Coreg 12.5 mg recommending twice a day.  3. Lanoxin 0.125 mg daily.  4. Lipitor 10 mg daily.  5. Spironolactone 25 mg daily.  6. Diovan 320 mg daily.  7. Lasix to take as before this admission.  8. Add the antibiotic as prescribed by Dr. [name].  9. Add Indomethacin 50 mg three times daily with food for a 10-day      course.  10.Pepcid AC 20 mg twice daily.   FOLLOWUP:  Once again, outpatient follow-up with Dr. Dillard Cannon  March 12.      Darrell Mirza, PA     GM/MEDQ  D:  03/05/2007  T:  03/05/2007  Job:  [id number]   cc:   Doylene Canning. Ladona Ridgel, MD  Eduardo Osier. [name], M.D.  Osvaldo Shipper. Spruill, M.D.  Kristine Garbe. Ezzard Standing, M.D.

## 2010-05-20 NOTE — Discharge Summary (Signed)
NAMEBLANCHE, GALLIEN             ACCOUNT NO.:  [id number]   MEDICAL RECORD NO.:  [id number]          PATIENT TYPE:  INP   LOCATION:  3739                         FACILITY:  [facility]   PHYSICIAN:  Mohan N. [name], M.D. DATE OF BIRTH:  24-Oct-1950   DATE OF ADMISSION:  04/27/2006  DATE OF DISCHARGE:  05/02/2006                               DISCHARGE SUMMARY   ADMITTING DIAGNOSES:  1. Probable small non-Q-wave myocardial infarction.  2. Mild congestive heart failure.  3. Uncontrolled hypertension.  4. Coronary artery disease.  5. History of myocardial infarction in the past.  6. Sarcoidosis.   DISCHARGE DIAGNOSES:  1. Severe nonischemic dilated cardiomyopathy.  2. Compensated congestive heart failure.  3. Hypertension.  4. Questionable history of myocardial infarction in the past.  5. Sarcoidosis.  6. Hypercholesteremia.   DISCHARGE MEDICATIONS:  1. Coreg 6.25 mg 1 tablet every 12 hours.  2. Altace 2.5 mg 1 capsule twice daily.  3. Digoxin 0.25 mg 1 tablet daily.  4. Lasix 40 mg 1 tablet twice daily.  5. Aldactone 25 mg 1 tablet daily.  6. Lipitor 10 mg 1 tablet daily.   The patient has been advised for low salt, low cholesterol diet.  The  patient has been advised to monitor his weight daily.  If he has any  shortness of breath, swelling of the legs or weight gain more than 2  pounds, should report to me immediately.  CBC BMP and BNP blood work in  1 week.  Follow-up with me in 1 week.   CONDITION AT DISCHARGE:  Stable.   BRIEF HISTORY AND HOSPITAL COURSE:  Mr. Falco is a 60 year old black  male with past medical history significant for coronary artery disease,  history of MI in 2003, hypertension, history of congestive heart  failure, enlarged heart, history of sarcoidosis, was admitted via ER  complaining of retrosternal chest pain grade 7/10 radiating to the right  arm associated shortness of breath, nausea, vomiting. States the past  few months is having recurrent  chest pain and progressive increasing  shortness of breath with minimal exertion associated with palpitation.  Denies lightheadedness or syncope.  The patient was seen in the [place] in  January 2008, and was told to have enlarged heart.  Denies any PND,  orthopnea, leg swelling.  Cardiology consult is called as the patient  had recurrent chest pain earlier today and was noted to have elevated  CPK-MB.   PAST MEDICAL HISTORY:  As above.   PAST SURGICAL HISTORY:  He had back surgery in the past.   ALLERGIES:  NO KNOWN DRUG ALLERGIES.   MEDICATION:  He is on aspirin, Lasix and metoprolol.  He is noncompliant  to medications.   SOCIAL HISTORY:  He is married, three children.  No history of smoking  or alcohol abuse.  Used to do cocaine 7-8 years ago, quit in 2004.   FAMILY HISTORY:  Father is alive, he has nonischemic dilated  cardiomyopathy, he has hypertension and CA of prostate.  Mother is  alive.  She has hypertension and vertigo.  One sister and one brother in  good  health.   PHYSICAL EXAMINATION:  GENERAL APPEARANCE:  He was alert and oriented x3  in no acute distress.  VITAL SIGNS:  Blood pressure was 135/95, pulse was 86 regular.  HEENT:  Conjunctivae was pink.  NECK:  Supple, positive JVD, no bruit.  LUNGS:  He has decreased breath sounds at bases.  CARDIOVASCULAR:  S1 and S2 was normal.  There was soft systolic murmur  and S3 gallop.  ABDOMEN:  Soft.  Bowel sounds present, obese, nontender.  EXTREMITIES:  There was no clubbing, cyanosis or edema.   His EKG showed normal sinus rhythm with ST depression in anterolateral  leads which were more pronounced than prior EKG done earlier today and  also changes of LVH.   His hemoglobin was 13.4, hematocrit 39.7, white count of 4.9.  Potassium  was 3.4.  BNP was 1490.  BUN 19, creatinine 1.63.  His cardiac enzymes:  CPK was 242, MB 6.8, relative index 2.8 which was slightly elevated,  however, troponin-I was 0.04.  Repeat troponin  and cardiac panels were  also negative.   BRIEF HOSPITAL COURSE:  The patient was admitted to telemetry unit.  The  patient ruled in for probable very small non-Q-wave MI due to elevated  CPK-MB.  The patient subsequently underwent left cath which showed  severely depressed LV systolic function with EF of 10-15%.  All his  native coronary arteries and precordial coronary arteries were patent.  The patient did not have any further episodes of chest pain.  The  patient was started on beta blockers and ACE inhibitors and diuretics  with good diuresis.  The patient's BNP has gradually come down.  The  patient is eager to go home.  The patient will be discharged home on  above medications and will be followed up in my office in 1 week.  He  will get 2-D echo in few months.  If his LV function remains depressed  below 35%, will get EP consult for possible ICD.           ______________________________  Eduardo Osier. [name], M.D.     MNH/MEDQ  D:  05/02/2006  T:  05/02/2006  Job:  [id number]

## 2010-05-20 NOTE — Cardiovascular Report (Signed)
NAMEJAHLANI, LORENTZ             ACCOUNT NO.:  [id number]   MEDICAL RECORD NO.:  [id number]           PATIENT TYPE:   LOCATION:                                 FACILITY:   PHYSICIAN:  Mohan N. [name], M.D.      DATE OF BIRTH:   DATE OF PROCEDURE:  04/27/2006  DATE OF DISCHARGE:                            CARDIAC CATHETERIZATION   PROCEDURES:  1. Left cardiac catheterization.  2. Selective left and right coronary angiography.  3. Left ventriculography via right groin using Judkins technique.   INDICATION FOR THE PROCEDURE:  Mr. Hu is a 60 year old black male  with past medical history significant for coronary artery disease,  history of MI in 2003, hypertension, congestive heart failure, history  of sarcoidosis, was admitted via ER complaining of retrosternal chest  pain rated 7/10 radiating to the right arm associated with shortness of  breath, nausea and vomiting.  States for the past few months he is  having recurrent chest pain and increasing progressive shortness of  breath with minimal exertion associated with palpitations.  Denies any  lightheadedness or syncope.  States was seen at [facility] in January 2008 and  was told to have enlarged heart.  Denies any PND, orthopnea, leg  swelling.  Cardiology consultation is called as the patient had  recurrent chest pain earlier today and was noted to have elevated CPK MB  of 6.2, total CK of 242 with total  index of 2.8 due to multiple risk  factors and recurrent congestive heart failure admitted, elevated CPK MB  and ST depression and anterolateral leads associated with LVH.  Discussed with patient regarding left cath, possible PTCA stenting.  Risks and benefits, i.e. at that time are stroke, need for emergency  CABG, risk of restenosis, local vascular complications, et Karie Soda, and  consented for the procedure.   PROCEDURE:  After obtaining the informed consent, patient was brought to  the catheterization lab and was placed on  the fluoroscopy table.  The  right groin was prepped and draped in the usual fashion.  Xylocaine 2%  was used for local anesthesia in the right groin.  With the help of thin  walled needle, 6-French arterial sheath was placed.  The sheath was  aspirated and flushed.  Next, a 6-French left Judkins catheter was  advanced over the wire under fluoroscopic guidance up to the ascending  aorta.  The wire was pulled down.  The catheter was aspirated and  connected to the manifold.  The catheter was further advanced and  engaged into left coronary ostium.  Multiple views of the left system  were taken.  Next, the catheter was disengaged and was pulled out over  the wire and was replaced with a 6-French right Judkins catheter, which  was advanced over the wire under fluoroscopic guidance up to the  ascending aorta.  The wire was pulled out.  The catheter was aspirated  and connected to the manifold.  The catheter was further advanced and  engaged into the right coronary ostium.  Multiple views of the right  system were taken.  Next, the catheter was  disengaged and was pulled out  over the wire and was replaced with a 6-French pigtail catheter, which  was advanced over the wire under fluoroscopic guidance up to the  ascending aorta.  The wire was pulled out.  The catheter was aspirated  and connected to the manifold.  Catheter was further advanced across the  aortic valve into the LV.  LV pressures were recorded.  Next, LV-graph  was done in 30 degree RAO position.  Post angiographic pressures were  recorded from LV and then pullback pressures were recorded from the  aorta.  There was no gradient across the aortic valve.  Next, the  pigtail catheter was pulled out over the wire, the sheaths were  aspirated and flushed.   FINDINGS:  The LV was severely dilated.  There was global hypokinesia.  EF of 10% to 15%.  The left main was long which was patent.  LAD was  patent.  Diagonal 1 to diagonal 2 were  small, which were patent.  Ramus  was  patent.  Left circumflex was patent.  The OM1 was moderate size, which  was patent.  RCA has patent.  The patient tolerated the procedure well.  There were no complications.  The patient was transferred to the  recovery room in stable condition.      Eduardo Osier. [name], M.D.  Electronically Signed     MNH/MEDQ  D:  11/22/2006  T:  11/22/2006  Job:  [id number]   cc:   catheterization laboratory  Mohan N. [name], M.D.

## 2010-05-20 NOTE — H&P (Signed)
[name], [name]             ACCOUNT NO.:  [id number]   MEDICAL RECORD NO.:  [id number]          PATIENT TYPE:  INP   LOCATION:  1410                         FACILITY:  [facility]   PHYSICIAN:  Ladell Pier, M.D.   DATE OF BIRTH:  1950/11/05   DATE OF ADMISSION:  04/26/2006  DATE OF DISCHARGE:                              HISTORY & PHYSICAL   CHIEF COMPLAINT:  Shortness of breath, chest pain and epigastric pain.   HISTORY OF PRESENT ILLNESS:  The patient is a 60 year old African-  American male with a past medical history significant for sarcoidosis.  The patient stated that, over the past month, he has been getting  progressively short of breath.  He states that now he can not even walk  up his driveway.  He states that, whenever he walks, he gets really  short of breath with nausea and sometimes vomiting.  He complains of  chest pain, more so on the right side, radiating down to the right arm.  He states that, for the past 2 days, he has been getting more nausea and  vomiting but no diaphoresis.   PAST MEDICAL HISTORY:  Significant for sarcoidosis, diagnosed by Dr.  Bruna Potter in 1980.   FAMILY HISTORY:  His mother is 37 years old with a history of vertigo.  Father is 50 years old with dementia, prostate cancer, CHF, two siblings  that are healthy.   SOCIAL HISTORY:  He is married with three children.  No tobacco or  alcohol use.  Quit cocaine back in May 2004.  He is employed doing [employer].   MEDICATIONS:  He was started on these medications January 2008:  1. Aspirin 325 mg daily.  2. Lasix 40 mg daily.  3. Metoprolol tartrate 50 mg half b.i.d.   ALLERGIES:  NONE.   REVIEW OF SYSTEMS:  As per stated in HPI.   PHYSICAL EXAM:  VITAL SIGNS:  Temperature 98, blood pressure 139/109,  pulse 88, respirations 20, pulse ox 98% on room air.  HEENT:  Head normocephalic, atraumatic.  Pupils equal, round, reactive  to light.  Throat without erythema.  CARDIOVASCULAR:  Regular rate rhythm.  LUNGS:  Clear bilaterally.  ABDOMEN:  Positive bowel sounds.  EXTREMITIES:  Without edema.   LABORATORY:  BNP 1,490, sodium 141, potassium 3.4, chloride 108, CO2 26,  glucose 97, BUN 19, creatinine 1.63.  WBC 4.9, hemoglobin 13.4,  platelets 212,  first set of a cardiac enzymes normal at CK, myoglobin  192, MB 6.3, troponin less than 0.05. this is a day.  We reviewed chest  x-ray.  EKG showed sinus tachycardia with first-degree AV block.   ASSESSMENT/PLAN:  1. 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  Will admit him to a tele-      bed, give him IV Lasix, replace potassium.  Will cycle enzymes,      check his TSH, lipid panel.  Will also get a cardiology consult.      We will also start him on prednisone 40 mg b.i.d.  for presumptive      cardiac sarcoid.  He is also on a beta blocker, will continue      Toprol XL at 50 mg daily.  2. Hypokalemia.  Will replace his potassium and check his magnesium      level.  3. Sarcoidosis as per above.  Will start on prednisone and check a 2-D      echo to rule out as part of the workup for cardiac sarcoid      prophylaxis.  Will put him on Lovenox for DVT prophylaxis and      Protonix for GI prophylaxis.      Ladell Pier, M.D.  Electronically Signed     NJ/MEDQ  D:  04/26/2006  T:  04/26/2006  Job:  [id number]

## 2010-06-26 ENCOUNTER — Inpatient Hospital Stay (HOSPITAL_COMMUNITY)
Admission: EM | Admit: 2010-06-26 | Discharge: 2010-07-02 | DRG: 292 | Disposition: A | Discharge status: Home Health | Payer: PRIVATE HEALTH INSURANCE | Source: Ambulatory Visit | Attending: Cardiology | Admitting: Cardiology

## 2010-06-26 ENCOUNTER — Emergency Department (HOSPITAL_COMMUNITY)
Admission: EM | Admit: 2010-06-26 | Discharge: 2010-06-26 | Disposition: A | Discharge status: Other Acute Inpatient Hospital | Payer: PRIVATE HEALTH INSURANCE | Source: Home / Self Care | Attending: Emergency Medicine | Admitting: Emergency Medicine

## 2010-06-26 ENCOUNTER — Emergency Department (HOSPITAL_COMMUNITY): Payer: PRIVATE HEALTH INSURANCE

## 2010-06-26 DIAGNOSIS — R12 Heartburn: Secondary | ICD-10-CM | POA: Diagnosis present

## 2010-06-26 DIAGNOSIS — N179 Acute kidney failure, unspecified: Secondary | ICD-10-CM | POA: Diagnosis present

## 2010-06-26 DIAGNOSIS — I4891 Unspecified atrial fibrillation: Secondary | ICD-10-CM | POA: Insufficient documentation

## 2010-06-26 DIAGNOSIS — K219 Gastro-esophageal reflux disease without esophagitis: Secondary | ICD-10-CM | POA: Diagnosis present

## 2010-06-26 DIAGNOSIS — I10 Essential (primary) hypertension: Secondary | ICD-10-CM | POA: Insufficient documentation

## 2010-06-26 DIAGNOSIS — I509 Heart failure, unspecified: Secondary | ICD-10-CM | POA: Diagnosis present

## 2010-06-26 DIAGNOSIS — E7800 Pure hypercholesterolemia, unspecified: Secondary | ICD-10-CM | POA: Diagnosis present

## 2010-06-26 DIAGNOSIS — M109 Gout, unspecified: Secondary | ICD-10-CM | POA: Diagnosis present

## 2010-06-26 DIAGNOSIS — I428 Other cardiomyopathies: Secondary | ICD-10-CM | POA: Diagnosis present

## 2010-06-26 DIAGNOSIS — R0989 Other specified symptoms and signs involving the circulatory and respiratory systems: Secondary | ICD-10-CM | POA: Insufficient documentation

## 2010-06-26 DIAGNOSIS — I5021 Acute systolic (congestive) heart failure: Principal | ICD-10-CM | POA: Diagnosis present

## 2010-06-26 DIAGNOSIS — I129 Hypertensive chronic kidney disease with stage 1 through stage 4 chronic kidney disease, or unspecified chronic kidney disease: Secondary | ICD-10-CM | POA: Diagnosis present

## 2010-06-26 DIAGNOSIS — E875 Hyperkalemia: Secondary | ICD-10-CM | POA: Diagnosis present

## 2010-06-26 DIAGNOSIS — Z95810 Presence of automatic (implantable) cardiac defibrillator: Secondary | ICD-10-CM | POA: Insufficient documentation

## 2010-06-26 DIAGNOSIS — N189 Chronic kidney disease, unspecified: Secondary | ICD-10-CM | POA: Diagnosis present

## 2010-06-26 DIAGNOSIS — R0789 Other chest pain: Secondary | ICD-10-CM | POA: Diagnosis present

## 2010-06-26 DIAGNOSIS — E785 Hyperlipidemia, unspecified: Secondary | ICD-10-CM | POA: Insufficient documentation

## 2010-06-26 DIAGNOSIS — R0602 Shortness of breath: Secondary | ICD-10-CM | POA: Insufficient documentation

## 2010-06-26 DIAGNOSIS — I252 Old myocardial infarction: Secondary | ICD-10-CM | POA: Insufficient documentation

## 2010-06-26 DIAGNOSIS — R0609 Other forms of dyspnea: Secondary | ICD-10-CM | POA: Insufficient documentation

## 2010-06-26 LAB — COMPREHENSIVE METABOLIC PANEL
AST: 48 U/L — ABNORMAL HIGH (ref 0–37)
Albumin: 3.1 g/dL — ABNORMAL LOW (ref 3.5–5.2)
Calcium: 9.1 mg/dL (ref 8.4–10.5)
Creatinine, Ser: 1.74 mg/dL — ABNORMAL HIGH (ref 0.50–1.35)
GFR calc non Af Amer: 40 mL/min — ABNORMAL LOW (ref >60)
Sodium: 136 mEq/L (ref 135–145)
Total Protein: 7.3 g/dL (ref 6.0–8.3)

## 2010-06-26 LAB — URINALYSIS, ROUTINE W REFLEX MICROSCOPIC
Glucose, UA: NEGATIVE mg/dL
Leukocytes, UA: NEGATIVE
Protein, ur: 100 mg/dL — AB
Specific Gravity, Urine: 1.015 (ref 1.005–1.030)
pH: 6 (ref 5.0–8.0)

## 2010-06-26 LAB — DIFFERENTIAL
Basophils Absolute: 0 10*3/uL (ref 0.0–0.1)
Eosinophils Absolute: 0.4 10*3/uL (ref 0.0–0.7)
Eosinophils Relative: 8 % — ABNORMAL HIGH (ref 0–5)
Lymphocytes Relative: 26 % (ref 12–46)
Neutrophils Relative %: 52 % (ref 43–77)

## 2010-06-26 LAB — PROTIME-INR
INR: 1.03 (ref 0.00–1.49)
Prothrombin Time: 13.7 seconds (ref 11.6–15.2)

## 2010-06-26 LAB — APTT: aPTT: 34 seconds (ref 24–37)

## 2010-06-26 LAB — CBC
Platelets: 244 10*3/uL (ref 150–400)
RBC: 4.73 MIL/uL (ref 4.22–5.81)
RDW: 13.5 % (ref 11.5–15.5)
WBC: 4.9 10*3/uL (ref 4.0–10.5)

## 2010-06-26 LAB — URINE MICROSCOPIC-ADD ON

## 2010-06-27 LAB — PROTIME-INR
INR: 1.14 (ref 0.00–1.49)
INR: 1.09 (ref 0.00–1.49)
Prothrombin Time: 14.8 seconds (ref 11.6–15.2)
Prothrombin Time: 14.3 seconds (ref 11.6–15.2)

## 2010-06-27 LAB — CARDIAC PANEL(CRET KIN+CKTOT+MB+TROPI)
CK, MB: 2.8 ng/mL (ref 0.3–4.0)
Relative Index: 2.2 (ref 0.0–2.5)
Relative Index: 2.5 (ref 0.0–2.5)
Total CK: 149 U/L (ref 7–232)
Troponin I: <0.3 ng/mL (ref <0.30)
Troponin I: <0.3 ng/mL (ref <0.30)

## 2010-06-27 LAB — BASIC METABOLIC PANEL
BUN: 20 mg/dL (ref 6–23)
Chloride: 101 mEq/L (ref 96–112)
Creatinine, Ser: 1.56 mg/dL — ABNORMAL HIGH (ref 0.50–1.35)
GFR calc Af Amer: 55 mL/min — ABNORMAL LOW (ref >60)
GFR calc non Af Amer: 46 mL/min — ABNORMAL LOW (ref >60)
Glucose, Bld: 93 mg/dL (ref 70–99)

## 2010-06-27 LAB — CBC
Hemoglobin: 14.4 g/dL (ref 13.0–17.0)
MCH: 31 pg (ref 26.0–34.0)
MCHC: 34.9 g/dL (ref 30.0–36.0)
MCHC: 35.2 g/dL (ref 30.0–36.0)
Platelets: 212 10*3/uL (ref 150–400)
RDW: 13.5 % (ref 11.5–15.5)
WBC: 6.2 10*3/uL (ref 4.0–10.5)

## 2010-06-27 LAB — COMPREHENSIVE METABOLIC PANEL
ALT: 32 U/L (ref 0–53)
Albumin: 2.9 g/dL — ABNORMAL LOW (ref 3.5–5.2)
Alkaline Phosphatase: 65 U/L (ref 39–117)
GFR calc Af Amer: 51 mL/min — ABNORMAL LOW (ref >60)
Glucose, Bld: 107 mg/dL — ABNORMAL HIGH (ref 70–99)
Potassium: 3.9 mEq/L (ref 3.5–5.1)
Sodium: 135 mEq/L (ref 135–145)
Total Protein: 7.2 g/dL (ref 6.0–8.3)

## 2010-06-27 LAB — DIGOXIN LEVEL: Digoxin Level: <0.3 ng/mL — ABNORMAL LOW (ref 0.8–2.0)

## 2010-06-27 LAB — PRO B NATRIURETIC PEPTIDE: Pro B Natriuretic peptide (BNP): 2051 pg/mL — ABNORMAL HIGH (ref 0–125)

## 2010-06-27 LAB — HEPARIN LEVEL (UNFRACTIONATED)
Heparin Unfractionated: 0.36 IU/mL (ref 0.30–0.70)
Heparin Unfractionated: 0.31 IU/mL (ref 0.30–0.70)

## 2010-06-28 LAB — PROTIME-INR
INR: 1.18 (ref 0.00–1.49)
Prothrombin Time: 15.2 seconds (ref 11.6–15.2)

## 2010-06-28 LAB — BASIC METABOLIC PANEL
BUN: 27 mg/dL — ABNORMAL HIGH (ref 6–23)
Calcium: 9.2 mg/dL (ref 8.4–10.5)
Creatinine, Ser: 1.83 mg/dL — ABNORMAL HIGH (ref 0.50–1.35)
GFR calc Af Amer: 46 mL/min — ABNORMAL LOW (ref >60)
GFR calc non Af Amer: 38 mL/min — ABNORMAL LOW (ref >60)
Glucose, Bld: 129 mg/dL — ABNORMAL HIGH (ref 70–99)
Potassium: 4.2 mEq/L (ref 3.5–5.1)

## 2010-06-28 LAB — URINE CULTURE: Culture: NO GROWTH

## 2010-06-28 LAB — HEPARIN LEVEL (UNFRACTIONATED): Heparin Unfractionated: 0.43 IU/mL (ref 0.30–0.70)

## 2010-06-28 LAB — CBC
HCT: 43.6 % (ref 39.0–52.0)
MCHC: 34.9 g/dL (ref 30.0–36.0)
RDW: 13.2 % (ref 11.5–15.5)
WBC: 4.9 10*3/uL (ref 4.0–10.5)

## 2010-06-29 LAB — CBC
HCT: 45.6 % (ref 39.0–52.0)
Hemoglobin: 16.1 g/dL (ref 13.0–17.0)
MCHC: 35.3 g/dL (ref 30.0–36.0)
RBC: 5.28 MIL/uL (ref 4.22–5.81)
WBC: 4.2 10*3/uL (ref 4.0–10.5)

## 2010-06-29 LAB — PROTIME-INR
INR: 1.42 (ref 0.00–1.49)
Prothrombin Time: 17.6 seconds — ABNORMAL HIGH (ref 11.6–15.2)

## 2010-06-29 LAB — HEPARIN LEVEL (UNFRACTIONATED): Heparin Unfractionated: 0.52 IU/mL (ref 0.30–0.70)

## 2010-06-30 LAB — BASIC METABOLIC PANEL
CO2: 24 mEq/L (ref 19–32)
Calcium: 9.2 mg/dL (ref 8.4–10.5)
Creatinine, Ser: 1.99 mg/dL — ABNORMAL HIGH (ref 0.50–1.35)
GFR calc non Af Amer: 34 mL/min — ABNORMAL LOW (ref >60)
Glucose, Bld: 91 mg/dL (ref 70–99)
Sodium: 132 mEq/L — ABNORMAL LOW (ref 135–145)

## 2010-06-30 LAB — CBC
Hemoglobin: 16.5 g/dL (ref 13.0–17.0)
MCH: 30.7 pg (ref 26.0–34.0)
Platelets: 228 10*3/uL (ref 150–400)
RBC: 5.37 MIL/uL (ref 4.22–5.81)

## 2010-06-30 LAB — PROTIME-INR
INR: 1.68 — ABNORMAL HIGH (ref 0.00–1.49)
Prothrombin Time: 20.1 seconds — ABNORMAL HIGH (ref 11.6–15.2)

## 2010-06-30 LAB — PRO B NATRIURETIC PEPTIDE: Pro B Natriuretic peptide (BNP): 621.6 pg/mL — ABNORMAL HIGH (ref 0–125)

## 2010-07-01 LAB — BASIC METABOLIC PANEL
CO2: 18 mEq/L — ABNORMAL LOW (ref 19–32)
Calcium: 9.9 mg/dL (ref 8.4–10.5)
Chloride: 93 mEq/L — ABNORMAL LOW (ref 96–112)
Sodium: 128 mEq/L — ABNORMAL LOW (ref 135–145)

## 2010-07-01 LAB — CBC
HCT: 45.7 % (ref 39.0–52.0)
Hemoglobin: 15.5 g/dL (ref 13.0–17.0)
MCH: 29.5 pg (ref 26.0–34.0)
MCV: 86.9 fL (ref 78.0–100.0)
RBC: 5.26 MIL/uL (ref 4.22–5.81)

## 2010-07-01 LAB — PRO B NATRIURETIC PEPTIDE: Pro B Natriuretic peptide (BNP): 453.1 pg/mL — ABNORMAL HIGH (ref 0–125)

## 2010-07-01 LAB — DIGOXIN LEVEL: Digoxin Level: 1.1 ng/mL (ref 0.8–2.0)

## 2010-07-02 ENCOUNTER — Inpatient Hospital Stay (HOSPITAL_COMMUNITY): Payer: PRIVATE HEALTH INSURANCE

## 2010-07-02 LAB — BASIC METABOLIC PANEL
Calcium: 10 mg/dL (ref 8.4–10.5)
Creatinine, Ser: 2.28 mg/dL — ABNORMAL HIGH (ref 0.50–1.35)
GFR calc non Af Amer: 29 mL/min — ABNORMAL LOW (ref >60)
Glucose, Bld: 93 mg/dL (ref 70–99)
Sodium: 130 mEq/L — ABNORMAL LOW (ref 135–145)

## 2010-07-02 LAB — HEPARIN LEVEL (UNFRACTIONATED): Heparin Unfractionated: <0.1 IU/mL — ABNORMAL LOW (ref 0.30–0.70)

## 2010-07-02 LAB — CBC
MCHC: 35.1 g/dL (ref 30.0–36.0)
MCV: 86.2 fL (ref 78.0–100.0)
Platelets: 210 10*3/uL (ref 150–400)
RDW: 13 % (ref 11.5–15.5)
WBC: 6.4 10*3/uL (ref 4.0–10.5)

## 2010-07-02 LAB — PROTIME-INR: INR: 2.82 — ABNORMAL HIGH (ref 0.00–1.49)

## 2010-07-07 NOTE — Consult Note (Signed)
[name], [name]             ACCOUNT NO.:  [id number]  MEDICAL RECORD NO.:  [id number]  LOCATION:  4742                         FACILITY:  [facility]  PHYSICIAN:  Shirley Friar, MDDATE OF BIRTH:  07-16-1950  DATE OF CONSULTATION:  06/29/2010 DATE OF DISCHARGE:                                CONSULTATION   REQUESTING PHYSICIAN:  Mohan N. [name], MD  REASON FOR CONSULTATION:  Abdominal pain and heartburn.  HISTORY OF PRESENT ILLNESS:  [name] is a pleasant 60 year old black male with congestive heart failure who was admitted secondary to CHF exacerbation and has been medically managed for that.  He has a long history of heartburn and epigastric pain as well as dysphagia and was seen in consultation by me in February 2010; and at that time, an upper endoscopy was done, which was unrevealing.  It did show a small hiatal hernia, but no endoscopic evidence of esophageal stricture or peptic ulcer disease.  Benign-appearing antral nodules noted.  He states that despite daily proton pump inhibitor therapy, he continues to have burning especially with eating and feels like his food hangs up a lot after his first swallow of food.  He denies any nausea, vomiting, or regurgitation, is able to maintain his weight despite this heartburn sensation.  He does note that he feels like he has problems with his sinuses and feels like he has postnasal drip with coughing up a lot of phlegm.  He describes a gagging sensation when he eats followed by burning and then cramping in his upper part of his abdomen in the midline.  PAST MEDICAL HISTORY: 1. Congestive heart failure. 2. Hypertension. 3. Atrial fibrillation. 4. Hypercholesterolemia. 5. Sarcoidosis. 6. Chronic kidney disease. 7. Gastroesophageal reflux disease. 8. Cardiomyopathy. 9. Sarcoidosis.  CURRENT MEDICATIONS:  Heparin IV, Coumadin, allopurinol, amiodarone, Coreg, Lanoxin, Lasix, Protonix, Crestor, Aldactone, Carafate;  doses listed in the hospital record.  ALLERGIES:  No known drug allergies.  REVIEW OF SYSTEMS:  Negative from GI standpoint except as stated above.  PHYSICAL EXAM:  VITAL SIGNS:  Temperature 98.1, pulse 72, blood pressure 90/58. GENERAL:  Alert, well nourished, in no acute distress. ABDOMEN:  Soft, nontender, and nondistended.  Positive bowel sounds.  LABORATORY DATA:  White blood count 4.2, hemoglobin 16.1, platelet count 224, INR 1.42.  Other labs noted and listed in the hospital record.  IMPRESSION:  A 60 year old black male with a longstanding history of epigastric pain, heartburn, and dysphagia.  His esophagogastroduodenoscopy in 2010 was unrevealing and history is not consistent with obstructive process in his esophagus.  I suspect he has dysmotility, but he could have an infectious esophagitis such as Candida esophagitis with his worsening burning sensation.  I would recommend upper endoscopy in a.m. to further evaluate and if that is unrevealing, he will need an outpatient barium swallow and/or a manometry study will need to be considered if his dysphagia worsens.  He will need to remain on a proton pump inhibitor therapy daily and maybe twice a day as an outpatient depending on his upper endoscopy results.     Shirley Friar, MD     VCS/MEDQ  D:  06/29/2010  T:  06/30/2010  Job:  [id number]  cc:   Eduardo Osier. [name], M.D.  Electronically Signed by Charlott Rakes MD on 07/07/2010 04:36:43 PM

## 2010-07-07 NOTE — Op Note (Signed)
  [name], [name]             ACCOUNT NO.:  [id number]  MEDICAL RECORD NO.:  [id number]  LOCATION:  4742                         FACILITY:  [facility]  PHYSICIAN:  Shirley Friar, MDDATE OF BIRTH:  Aug 06, 1950  DATE OF PROCEDURE:  06/30/2010 DATE OF DISCHARGE:                              OPERATIVE REPORT   INDICATIONS:  Heartburn, abdominal pain.  MEDICATIONS:  Fentanyl 37.5 mcg IV, Versed 4 mg IV, Cetacaine spray x2.  FINDINGS:  Endoscope was inserted through oropharynx and esophagus was intubated which was normal in its entirety.  The Z-line was regular and was noted to be at 46 cm from the incisors.  Endoscope was advanced down to the stomach and in the distal, there were 3 benign-appearing yellowish small nodules that were noted.  The endoscope was retroflexed and the proximal stomach was unremarkable.  Endoscope was straightened and advanced into the duodenum.  Bulb and second portion of duodenum which were both normal.  Endoscope was then withdrawn to confirm above findings.  ASSESSMENT: 1. Normal upper endoscopy. 2. Benign nodule biopsied in 2010, they were xanthelasmas (benign).  PLAN: 1. Resume diet. 2. Outpatient follow up of his dysphagia to determine whether barium     swallow and/or manometry is necessary.     Shirley Friar, MD     VCS/MEDQ  D:  06/30/2010  T:  07/01/2010  Job:  [id number]  cc:   Eduardo Osier. [name], M.D.  Electronically Signed by Charlott Rakes MD on 07/07/2010 04:36:49 PM

## 2010-07-20 NOTE — Discharge Summary (Signed)
[name], [name]             ACCOUNT NO.:  [id number]  MEDICAL RECORD NO.:  [id number]  LOCATION:  4742                         FACILITY:  [facility]  PHYSICIAN:  Arney Mayabb N. [name], M.D. DATE OF BIRTH:  March 12, 1950  DATE OF ADMISSION:  06/26/2010 DATE OF DISCHARGE:  07/02/2010                              DISCHARGE SUMMARY   ADMITTING DIAGNOSES: 1. Atypical chest pain, rule out myocardial infarction. 2. Decompensated systolic heart failure. 3. Severe nonischemic dilated cardiomyopathy. 4. Hypertension. 5. Glucose intolerance. 6. Hypercholesteremia. 7. Chronic kidney disease. 8. History of nonsustained ventricular tachycardia and paroxysmal     atrial fibrillation. 9. Gastroesophageal reflux disease. 10.Gouty arthritis.  DISCHARGE DIAGNOSES: 1. Status post atypical chest pain, myocardial infarction ruled out. 2. Compensated systolic heart failure. 3. Severe nonischemic cardiomyopathy. 4. Hypertension. 5. Glucose intolerance. 6. Hypercholesteremia. 7. Status post hyperkalemia. 8. Acute on chronic kidney disease, stable. 9. History of nonsustained ventricular tachycardia and paroxysmal     atrial fibrillation in the past. 10.Gastroesophageal reflux disease. 11.Gouty arthritis. 12.History of sarcoidosis.  DISCHARGE MEDICATIONS: 1. Carvedilol 6.25 mg 1 tablet twice daily with meals. 2. Digoxin 0.25 mg 1 tablet daily. 3. Protonix 40 mg 1 tablet twice daily. 4. Aldactone 25 mg 1 tablet daily. 5. Allopurinol 100 mg 1 tablet daily. 6. Amiodarone 100 mg 1 tablet daily. 7. Furosemide 1 tablet 80 mg 1 tablet twice daily. 8. Crestor 5 mg 1 tablet daily. 9. Carafate 1 g twice daily. 10.Warfarin 10 mg 1 tablet every evening.  DIET:  Low salt, low cholesterol.  The patient has been advised to avoid sweets.  The patient has been advised to monitor weight daily and reduce weight.  The patient has been advised to follow the heart failure instructions and if he notices any  increasing shortness of breath or coughing or swelling in the legs, arm, or feet or abdominal swelling or weight gain of 3 pounds overnight or 5 pounds in a week should call my office immediately.  Follow up with me in 1 week.  Follow up with Dr. Bosie Clos, GI in 3 weeks.  CONDITION AT DISCHARGE:  Stable.  BRIEF HISTORY AND HOSPITAL COURSE:  [name] is a 60 year old black male with past medical history significant for severe nonischemic dilated cardiomyopathy status post ICD in the past, history of recent recurrent systolic heart failure, hypertension, history of paroxysmal AFib and nonsustained VT, glucose intolerance, sarcoidosis, GERD, chronic kidney disease, and multiple admissions to the hospital.  He came to the ER complaining of progressive increasing shortness of breath associated with pleuritic chest pain and generalized weakness.  Denies any history of PND or orthopnea, but denies any palpitation, lightheadedness, or syncope.  Denies noncompliance to the medication or diet.  Denies any fever or chills.  Denies any urinary complaints. Complains of dry cough, but the patient was noted to have decompensated CHF with pro-BNP of 2361.  PAST MEDICAL HISTORY:  As above.  PAST SURGICAL HISTORY:  He had ICD placed in the past.  ALLERGIES:  No known drug allergies.  MEDICATION:  At home, he was on: 1. Lasix 80 mg p.o. b.i.d. 2. Aldactone 25 mg p.o. b.i.d. 3. Coreg 12.5 mg p.o. b.i.d. 4. Amiodarone 100 mg  p.o. daily. 5. Protonix 40 mg p.o. daily. 6. Crestor 5 mg p.o. daily. 7. Coumadin 10 mg p.o. daily, which probably he has stopped as his INR     was in therapeutic range.  SOCIAL HISTORY:  He is divorced, on disability.  No history of smoking or alcohol abuse.  FAMILY HISTORY:  Positive for coronary artery disease.  PHYSICAL EXAMINATION:  GENERAL:  He is alert, awake, and oriented x3, in no acute distress. VITAL SIGNS:  Blood pressure was 131/100.  Pulse was 89 and  regular. Afebrile. HEENT: Conjunctivae were pink. NECK:  Supple.  Positive JVD.  No bruit. LUNGS:  He has bibasilar rales. CARDIOVASCULAR:  S1 and S2 were normal.  There was soft systolic murmur and S3 gallop. ABDOMEN:  Soft.  Bowel sounds present.  Mildly distended. EXTREMITIES:  There was no clubbing, cyanosis, or edema.  LABORATORY DATA:  Hemoglobin was 14.4, hematocrit 40.9, and white count of 4.9.  His pro-BNP was 2361.  His two sets of cardiac enzymes were negative.  His PT was 14.3 and INR 1.09.  His TSH was 3.43.  Repeat pro- BNP on June 28, 2010, was 1027 which is trending down.  On June 30, 2010, pro-BNP is 621.  Today, pro-BNP is 635.  His digoxin level was 1.1.  His PT today is 30.1 and INR of 2.82.  His admission chest x-ray showed cardiomegaly with mild vascular congestion.  Repeat chest x-ray done today showed negative for congestive heart failure or pneumonia,  BRIEF HOSPITAL COURSE:  The patient was admitted to Telemetry Unit.  MI was ruled out by serial enzymes and EKG.  The patient was started on IV Lasix with good diuresis.  The patient did not have any further episodes of chest pain during the hospital stay, but the patient had vague abdominal pain and GI consultation was obtained with Dr. Bosie Clos.  The patient subsequently underwent upper endoscopy which showed normal EGD. The patient's Protonix dose was increased to b.i.d. with resolution of his abdominal pain.  The patient has been ambulating in the hallway without any problems.  The patient had multiple episodes of few beats of nonsustained VT during the hospital stay, but remained asymptomatic. His electrolytes and magnesium were normal range.  The patient will be discharged home on above medications and will be followed up in my office in 1 week.     [name]. [name], M.D.     MNH/MEDQ  D:  07/02/2010  T:  07/03/2010  Job:  [id number]  Electronically Signed by Rinaldo Cloud M.D. on 07/20/2010  11:10:48 PM

## 2010-08-19 IMAGING — CR DG CHEST 1V PORT
1 series · 1 of 1 positions shown · non-contrast
Comparison: 08/11/2008

CLINICAL DATA: Chest pain

PORTABLE CHEST - 1 VIEW

[view not recorded]
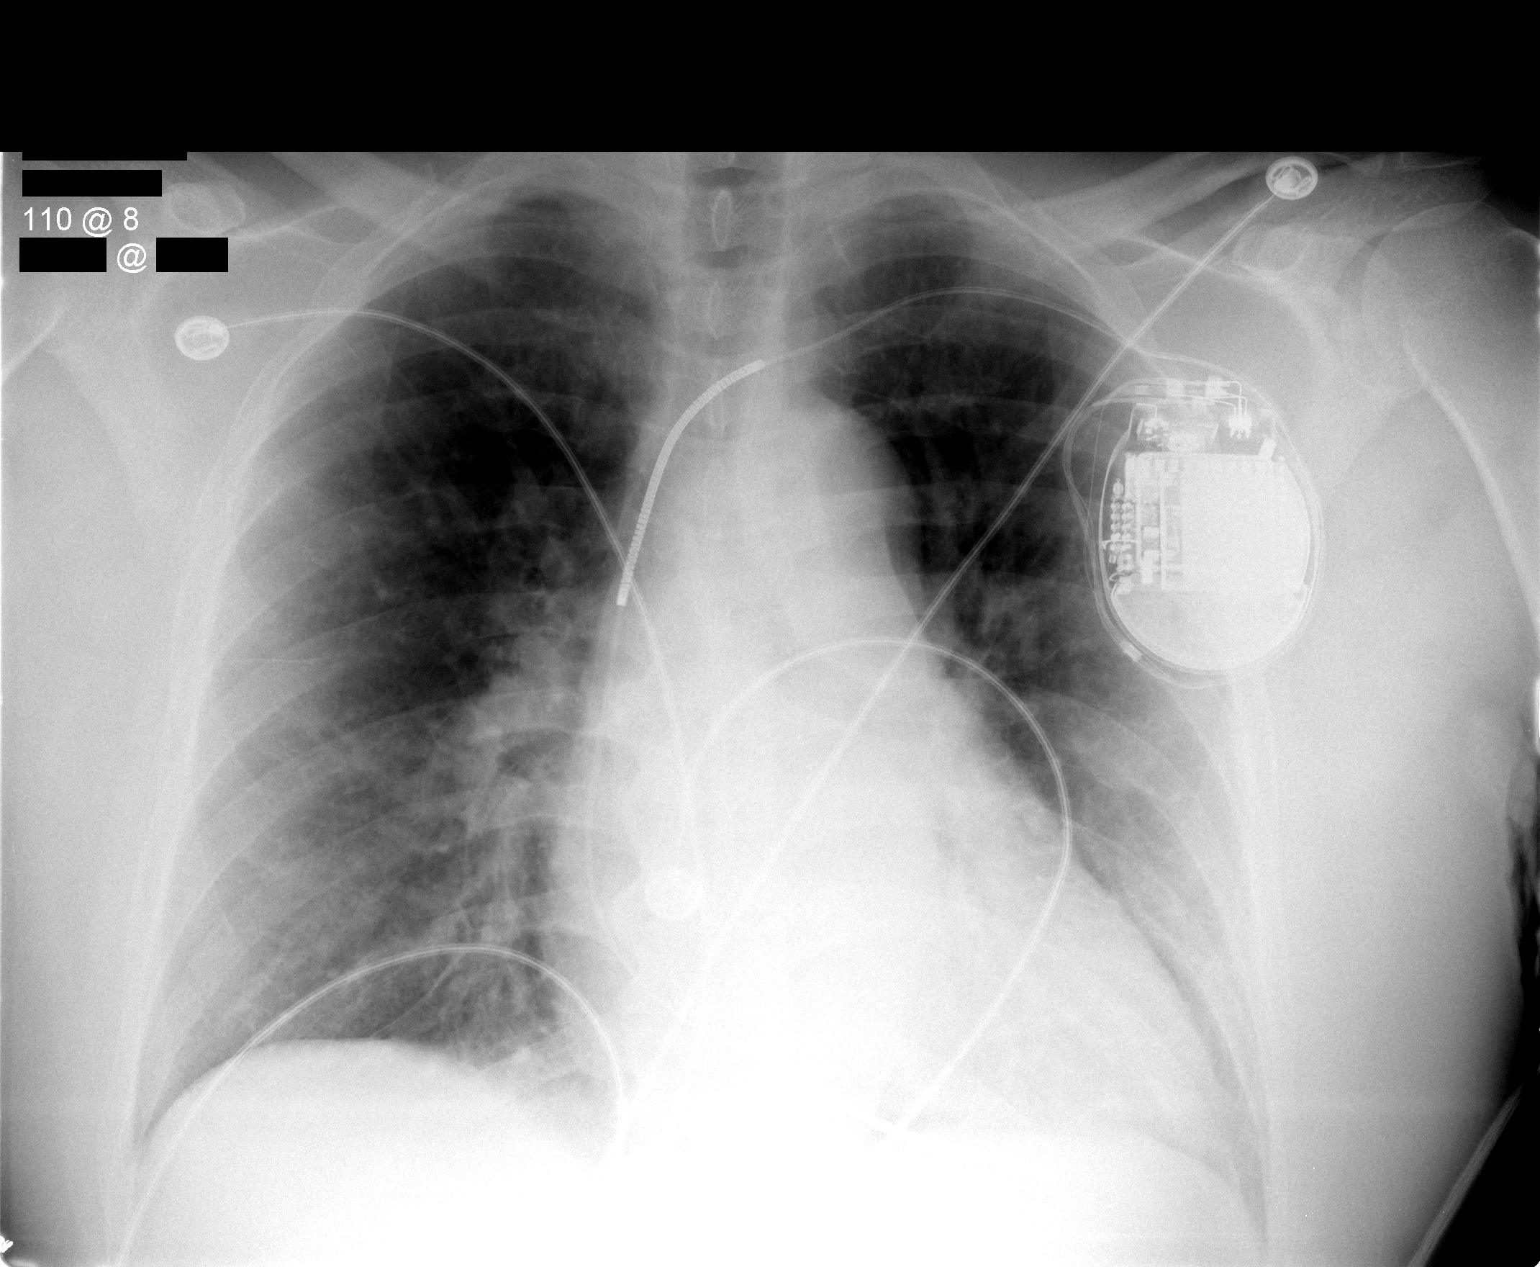

[1 of 1 positions shown; findings below may reference images not displayed]

FINDINGS: Left-sided AICD noted.  Mild enlargement of the
cardiomediastinal silhouette is noted.  No definite opacity is seen
allowing for technique.  No pleural effusion.
IMPRESSION: Mild cardiomegaly, no edema or focal acute finding. If the
patient's symptoms continue, consider PA and lateral chest
radiographs obtained at full inspiration when the patient is
clinically able.

## 2010-08-19 IMAGING — CR DG CERVICAL SPINE COMPLETE 4+V
6 series · 6 of 6 positions shown · non-contrast
Comparison: None.

CLINICAL DATA: Left arm numbness.

CERVICAL SPINE - COMPLETE 4+ VIEW

[w c-spine lat *]
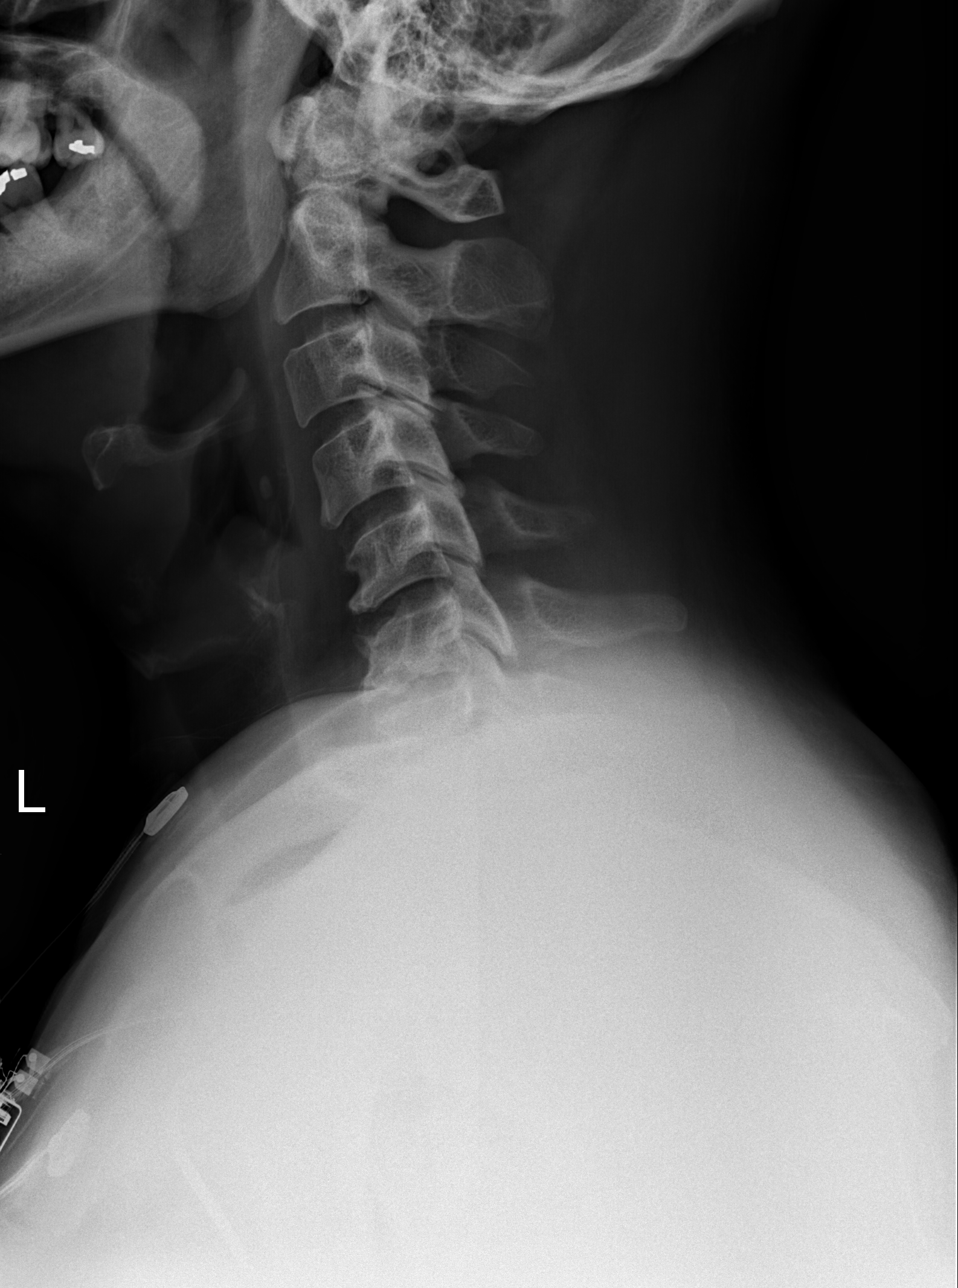

[w c-spine oblique (1 of 2)]
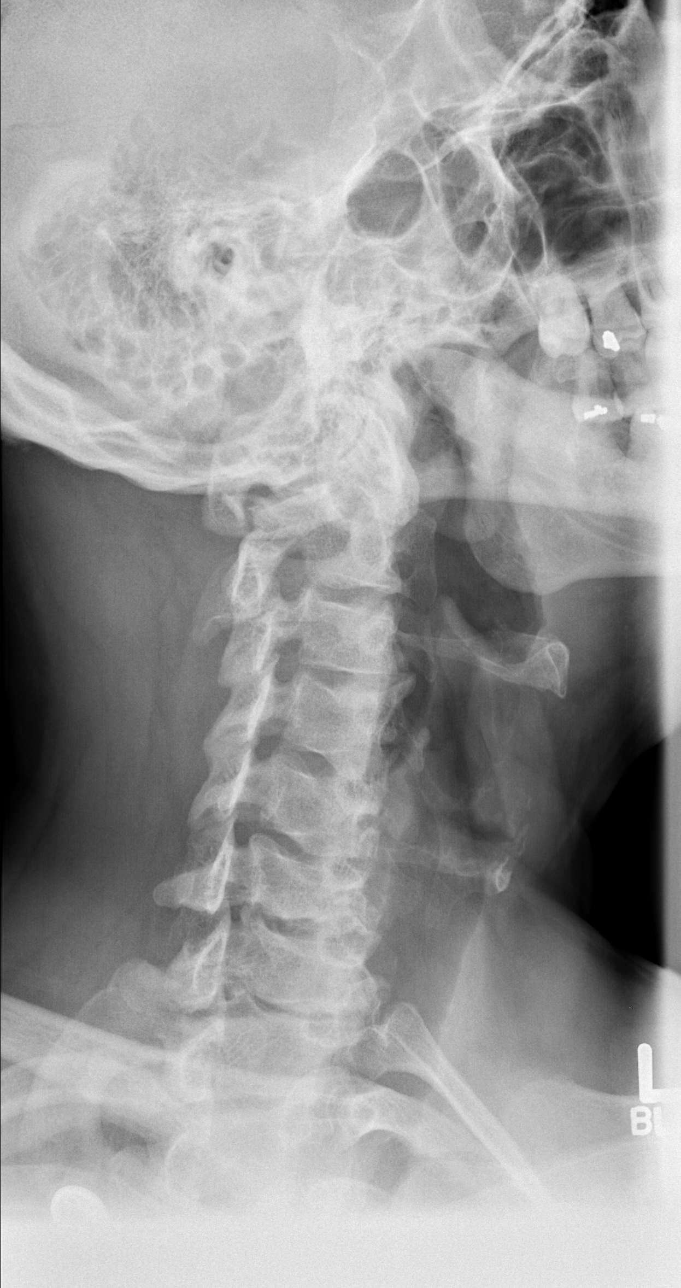

[w c-spine oblique (2 of 2)]
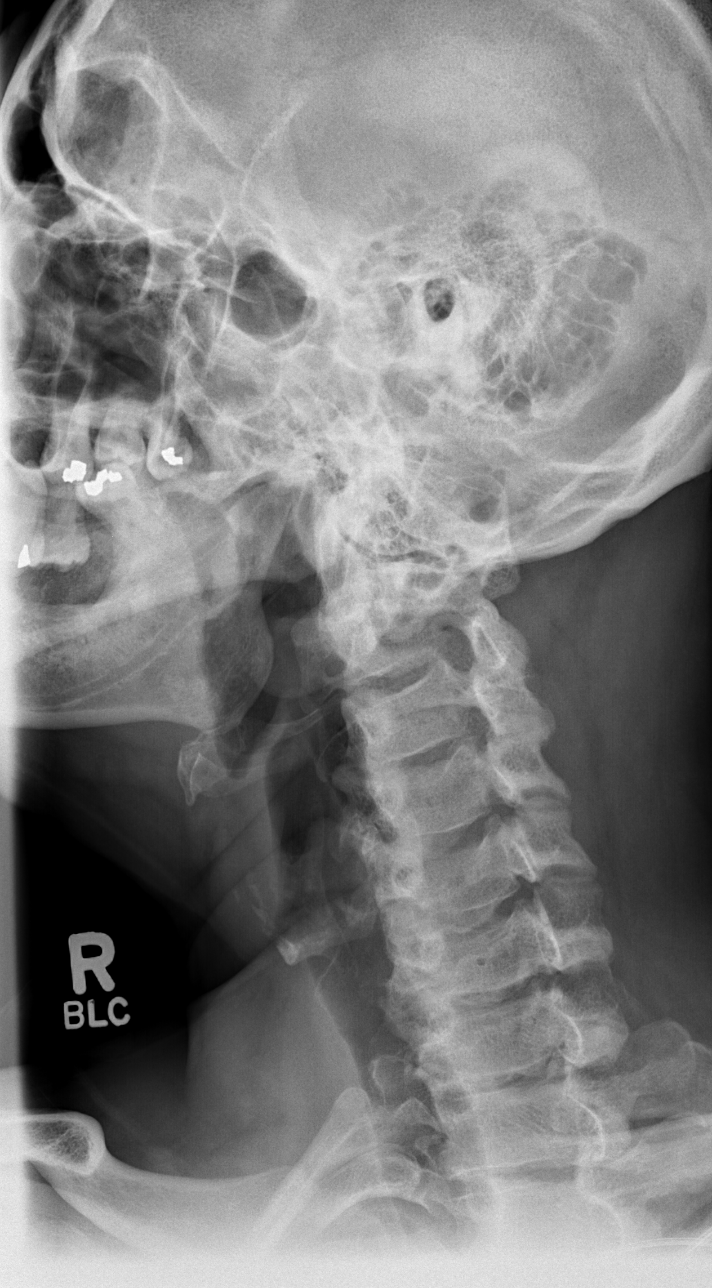

[w c-spine a.p.]
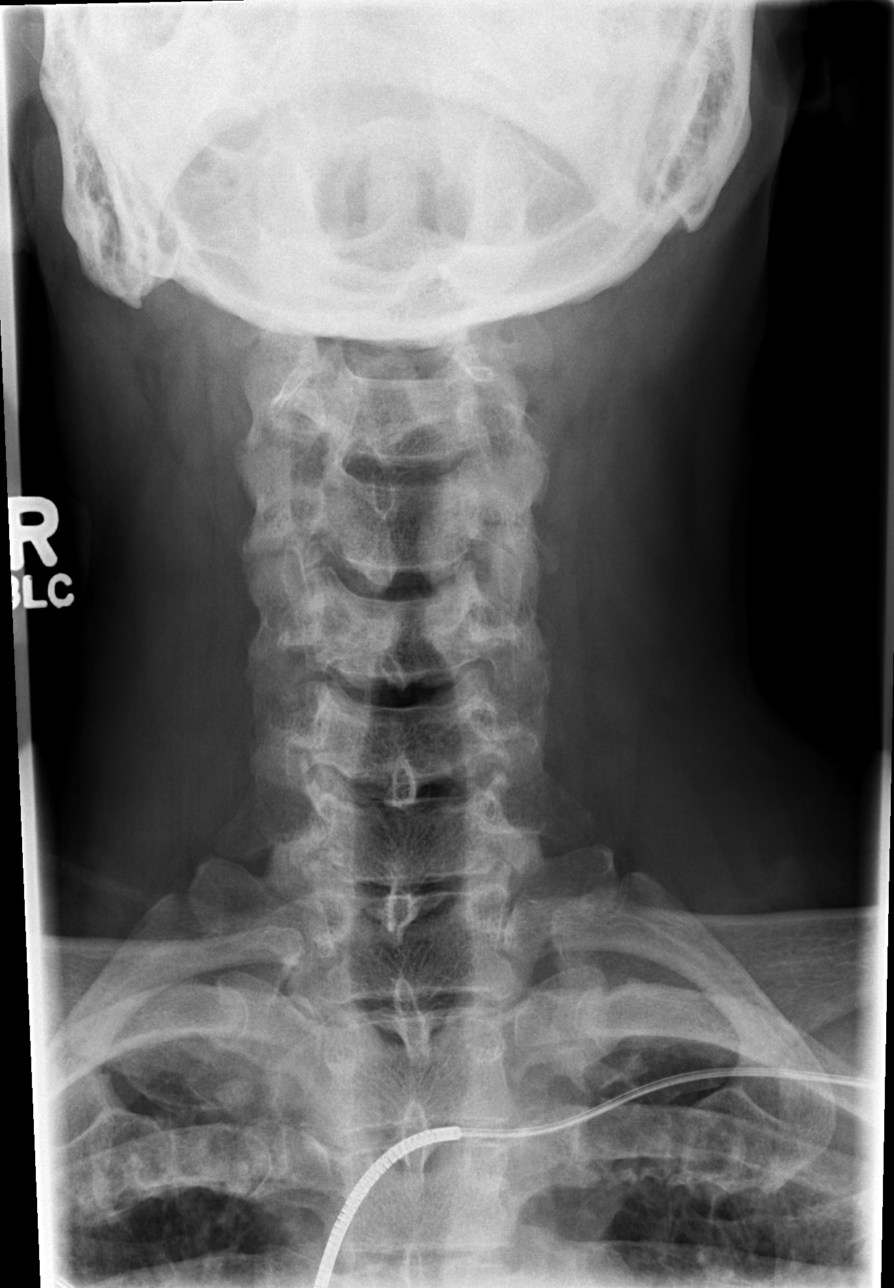

[w c-spine odontoid]
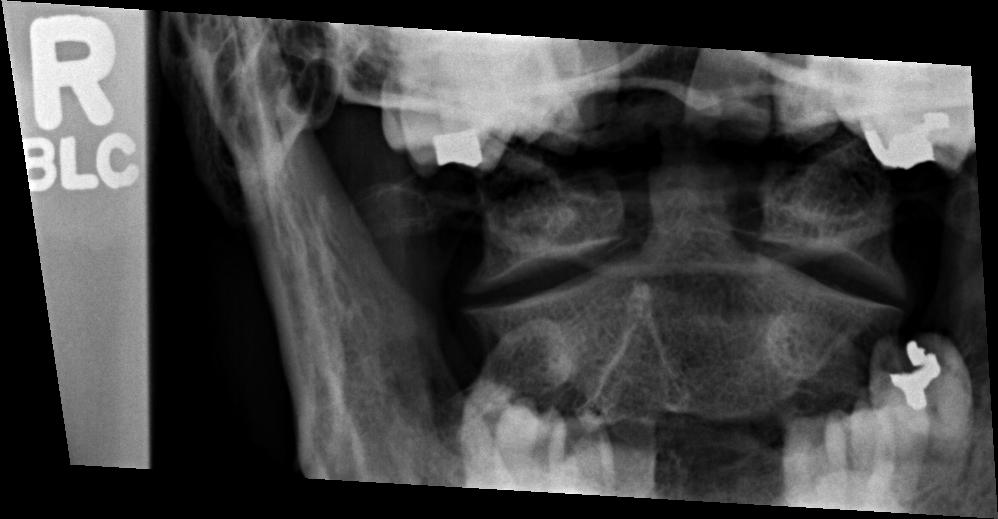

[w swimmers view *]
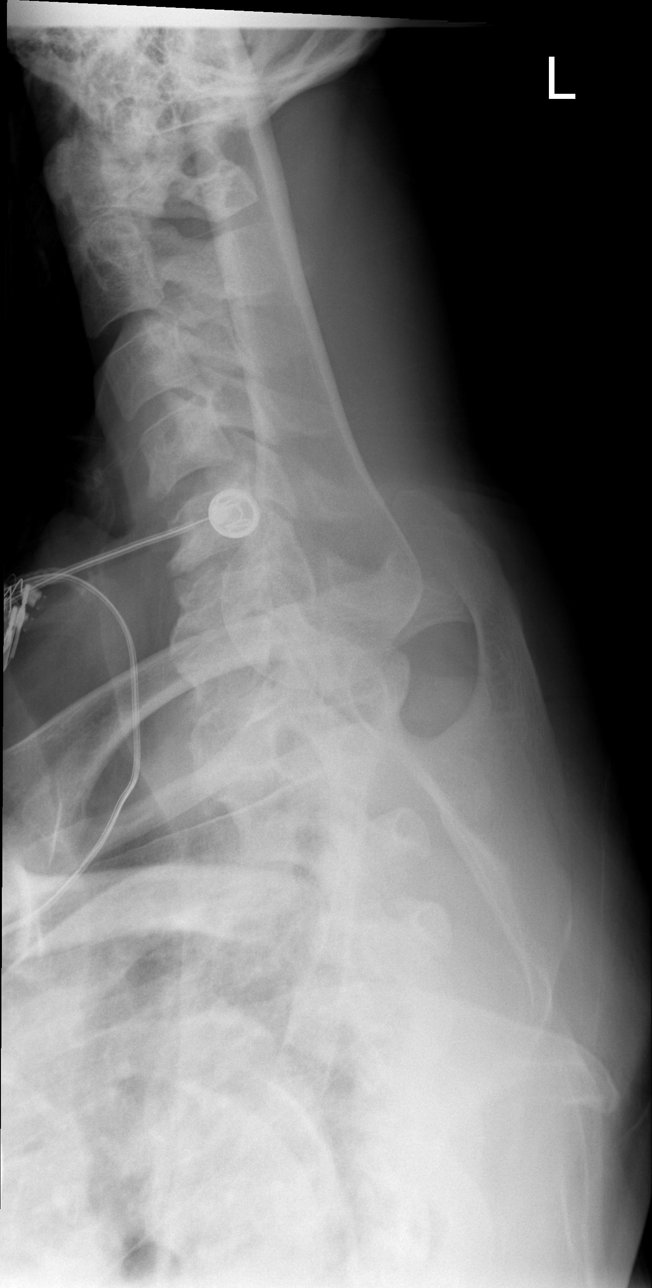

[6 of 6 positions shown; findings below may reference images not displayed]

FINDINGS: There is degenerative disc disease primarily at C6-7 and
to a lesser area of C5-6 and C7-T1.  There is slight reversal of
the cervical lordosis in the lower cervical spine.  There is no
fracture or prevertebral soft tissue swelling.  There are no severe
foraminal stenoses.
IMPRESSION: Degenerative disc disease in the lower cervical spine.

## 2010-08-22 IMAGING — CR DG CHEST 2V
2 series · 2 of 2 positions shown · non-contrast
Comparison: 03/07/2009.  08/11/2008

CLINICAL DATA: History of chest pain.  Cardiomyopathy.  Acute
coronary syndrome.  Dyspnea.

CHEST - 2 VIEW

[w chest pa]
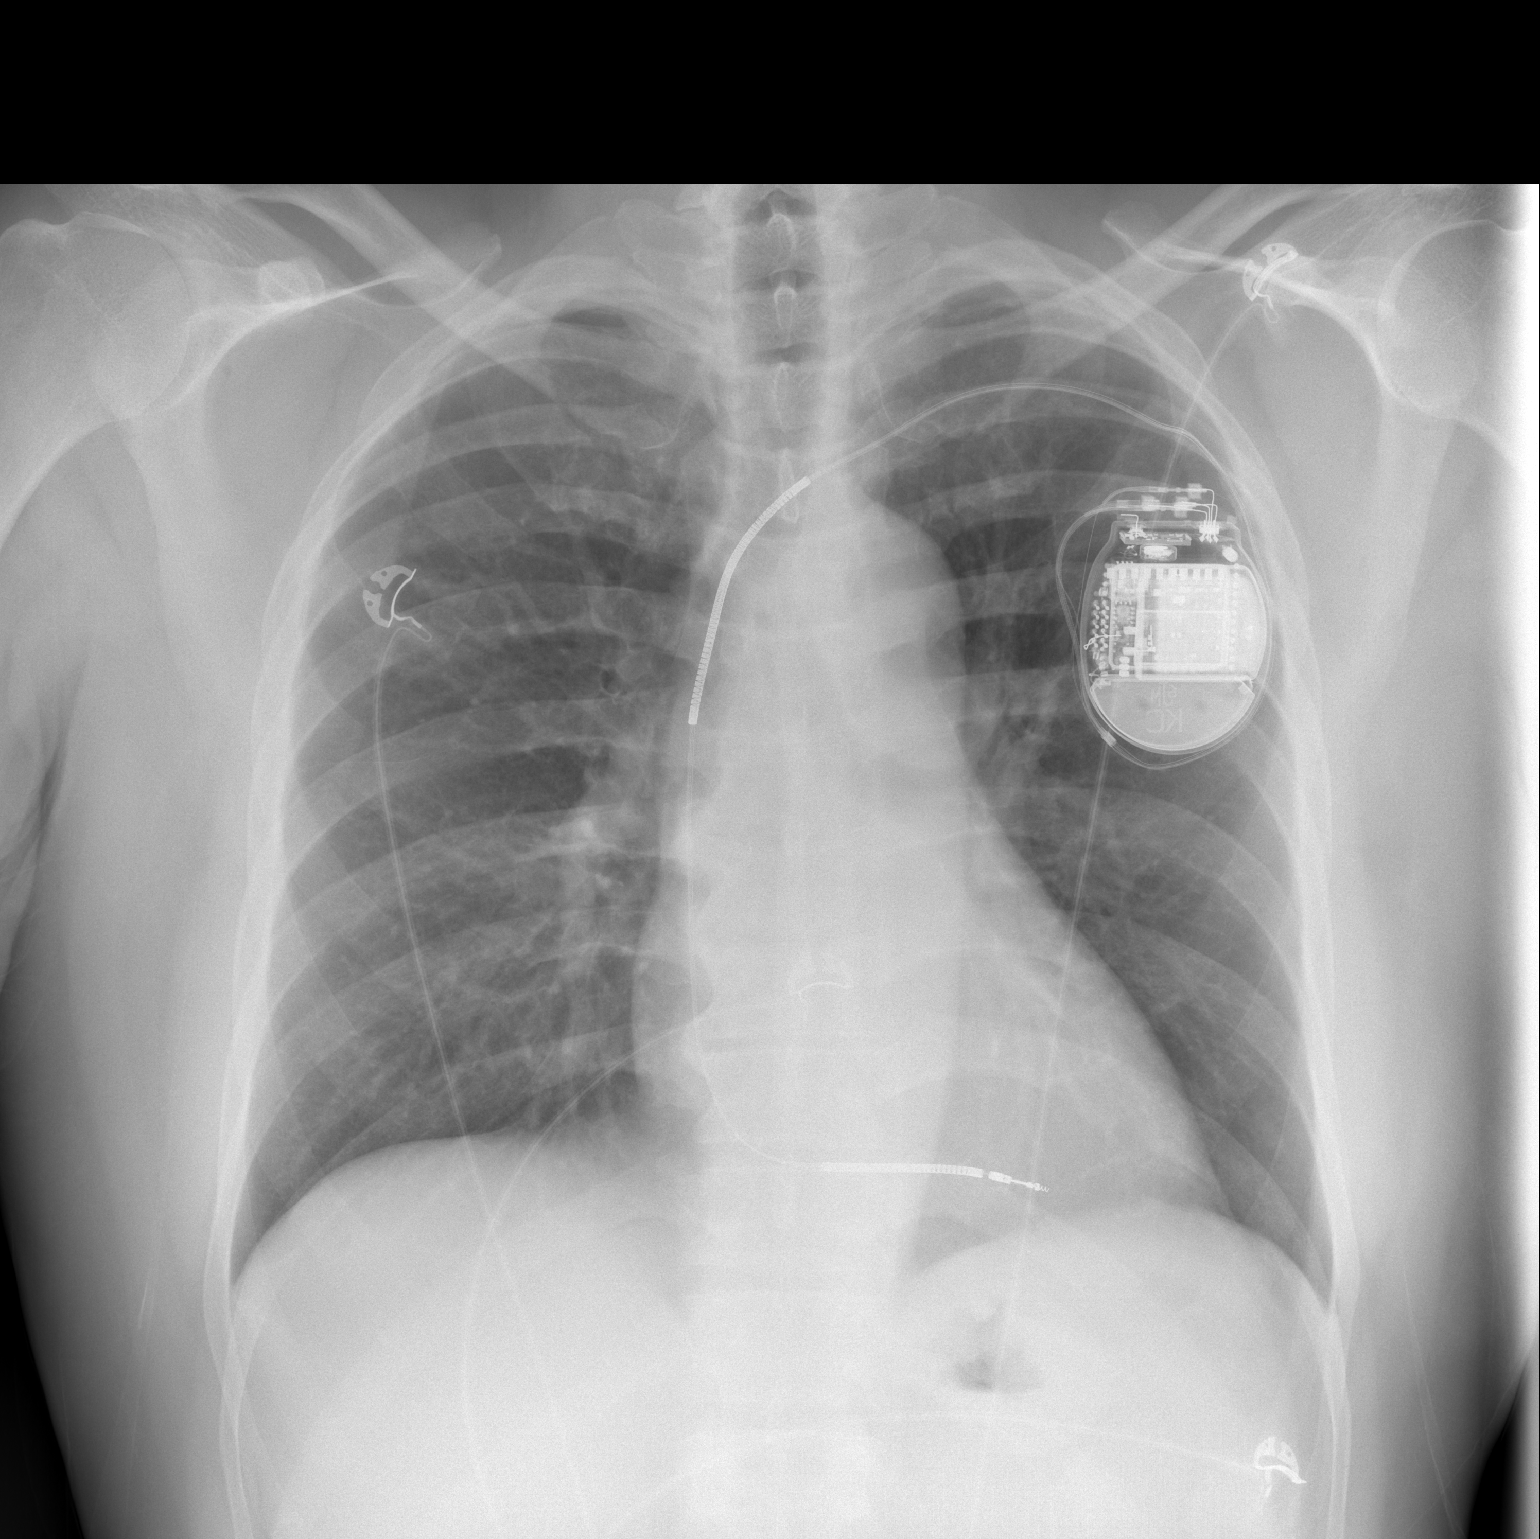

[w chest lat]
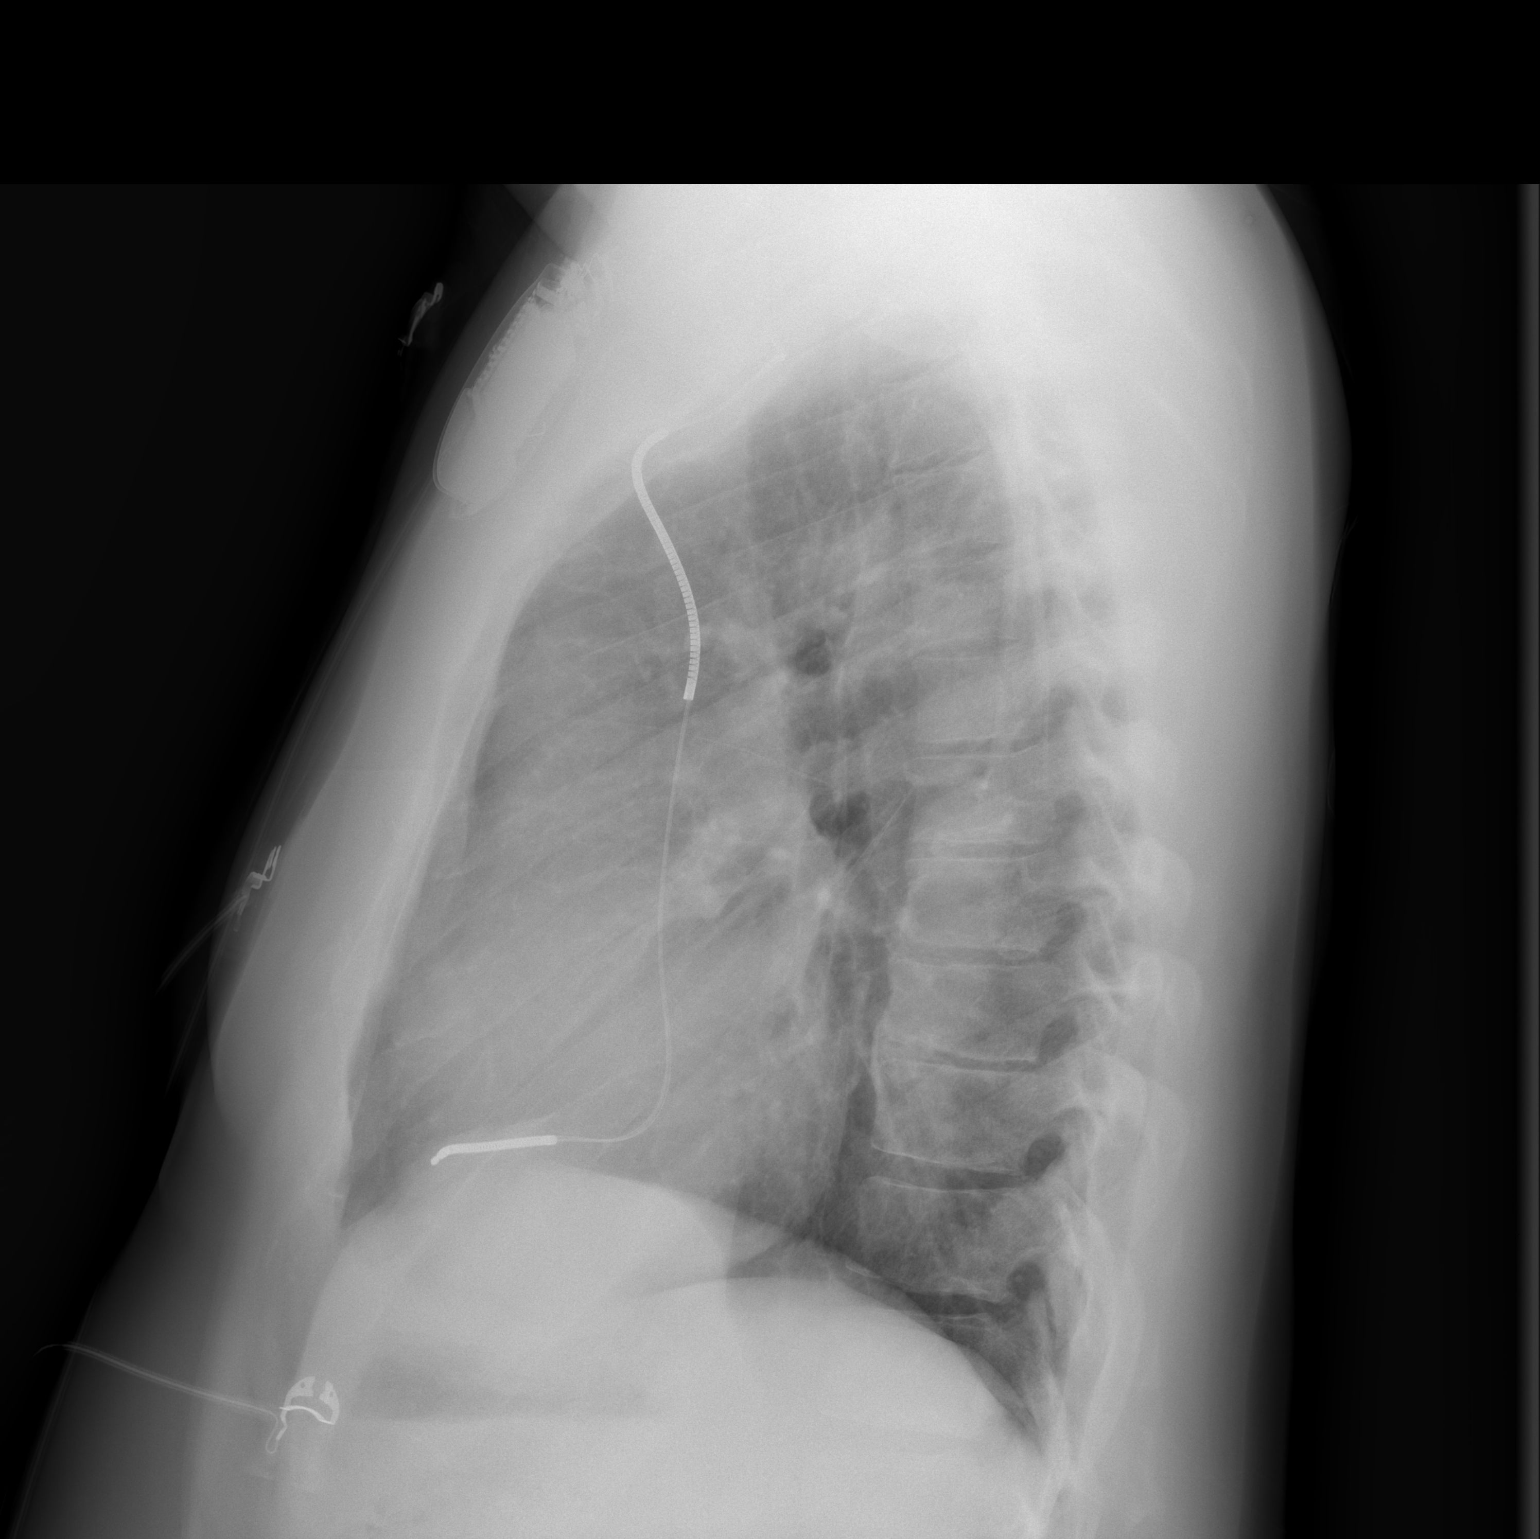

[2 of 2 positions shown; findings below may reference images not displayed]

FINDINGS: Single lead AICD is seen with controller device on the
left. The cardiac silhouette is normal size and shape. No pleural
abnormality is evident. Bones appear average for age. There are
osteophytes in the spine. The lungs are well aerated and free of
infiltrates.
IMPRESSION: AICD is in place.  No acute or active cardiopulmonary process is
identified.

## 2010-09-08 ENCOUNTER — Encounter: Payer: Self-pay | Admitting: *Deleted

## 2010-09-16 ENCOUNTER — Inpatient Hospital Stay (HOSPITAL_COMMUNITY)
Admission: EM | Admit: 2010-09-16 | Discharge: 2010-09-22 | DRG: 292 | Disposition: A | Discharge status: Home Health | Payer: PRIVATE HEALTH INSURANCE | Attending: Cardiology | Admitting: Cardiology

## 2010-09-16 ENCOUNTER — Emergency Department (HOSPITAL_COMMUNITY): Payer: PRIVATE HEALTH INSURANCE

## 2010-09-16 DIAGNOSIS — E7800 Pure hypercholesterolemia, unspecified: Secondary | ICD-10-CM | POA: Diagnosis present

## 2010-09-16 DIAGNOSIS — D869 Sarcoidosis, unspecified: Secondary | ICD-10-CM | POA: Diagnosis present

## 2010-09-16 DIAGNOSIS — Z79899 Other long term (current) drug therapy: Secondary | ICD-10-CM

## 2010-09-16 DIAGNOSIS — I129 Hypertensive chronic kidney disease with stage 1 through stage 4 chronic kidney disease, or unspecified chronic kidney disease: Secondary | ICD-10-CM | POA: Diagnosis present

## 2010-09-16 DIAGNOSIS — E739 Lactose intolerance, unspecified: Secondary | ICD-10-CM | POA: Diagnosis present

## 2010-09-16 DIAGNOSIS — I5020 Unspecified systolic (congestive) heart failure: Principal | ICD-10-CM | POA: Diagnosis present

## 2010-09-16 DIAGNOSIS — I428 Other cardiomyopathies: Secondary | ICD-10-CM | POA: Diagnosis present

## 2010-09-16 DIAGNOSIS — K219 Gastro-esophageal reflux disease without esophagitis: Secondary | ICD-10-CM | POA: Diagnosis present

## 2010-09-16 DIAGNOSIS — I4891 Unspecified atrial fibrillation: Secondary | ICD-10-CM | POA: Diagnosis present

## 2010-09-16 DIAGNOSIS — I509 Heart failure, unspecified: Secondary | ICD-10-CM | POA: Diagnosis present

## 2010-09-16 DIAGNOSIS — Z7901 Long term (current) use of anticoagulants: Secondary | ICD-10-CM

## 2010-09-16 DIAGNOSIS — N189 Chronic kidney disease, unspecified: Secondary | ICD-10-CM | POA: Diagnosis present

## 2010-09-16 DIAGNOSIS — Z95810 Presence of automatic (implantable) cardiac defibrillator: Secondary | ICD-10-CM

## 2010-09-16 LAB — LIPID PANEL
HDL: 71 mg/dL (ref >39)
Total CHOL/HDL Ratio: 2.5 RATIO
VLDL: 33 mg/dL (ref 0–40)

## 2010-09-16 LAB — POCT I-STAT TROPONIN I: Troponin i, poc: 0.21 ng/mL (ref 0.00–0.08)

## 2010-09-16 LAB — DIFFERENTIAL
Eosinophils Absolute: 0.4 10*3/uL (ref 0.0–0.7)
Eosinophils Relative: 8 % — ABNORMAL HIGH (ref 0–5)
Lymphs Abs: 2.3 10*3/uL (ref 0.7–4.0)
Monocytes Absolute: 0.7 10*3/uL (ref 0.1–1.0)
Monocytes Relative: 13 % — ABNORMAL HIGH (ref 3–12)

## 2010-09-16 LAB — CARDIAC PANEL(CRET KIN+CKTOT+MB+TROPI)
Relative Index: 2.9 — ABNORMAL HIGH (ref 0.0–2.5)
Total CK: 187 U/L (ref 7–232)
Troponin I: <0.3 ng/mL (ref <0.30)

## 2010-09-16 LAB — BASIC METABOLIC PANEL
BUN: 25 mg/dL — ABNORMAL HIGH (ref 6–23)
Calcium: 9.6 mg/dL (ref 8.4–10.5)
Creatinine, Ser: 1.65 mg/dL — ABNORMAL HIGH (ref 0.50–1.35)
GFR calc Af Amer: 52 mL/min — ABNORMAL LOW (ref >60)
GFR calc non Af Amer: 43 mL/min — ABNORMAL LOW (ref >60)

## 2010-09-16 LAB — PRO B NATRIURETIC PEPTIDE: Pro B Natriuretic peptide (BNP): 6407 pg/mL — ABNORMAL HIGH (ref 0–125)

## 2010-09-16 LAB — CBC
HCT: 38.3 % — ABNORMAL LOW (ref 39.0–52.0)
MCH: 30.9 pg (ref 26.0–34.0)
MCHC: 34.7 g/dL (ref 30.0–36.0)
MCV: 89.1 fL (ref 78.0–100.0)
Platelets: 183 10*3/uL (ref 150–400)
RDW: 13.9 % (ref 11.5–15.5)
WBC: 5.7 10*3/uL (ref 4.0–10.5)

## 2010-09-16 LAB — HEPARIN LEVEL (UNFRACTIONATED): Heparin Unfractionated: 0.5 IU/mL (ref 0.30–0.70)

## 2010-09-17 LAB — BASIC METABOLIC PANEL
CO2: 26 mEq/L (ref 19–32)
Calcium: 9.5 mg/dL (ref 8.4–10.5)
Chloride: 103 mEq/L (ref 96–112)
Creatinine, Ser: 1.72 mg/dL — ABNORMAL HIGH (ref 0.50–1.35)
GFR calc Af Amer: 49 mL/min — ABNORMAL LOW (ref >60)
Sodium: 138 mEq/L (ref 135–145)

## 2010-09-17 LAB — PROTIME-INR
INR: 1.03 (ref 0.00–1.49)
Prothrombin Time: 13.7 seconds (ref 11.6–15.2)

## 2010-09-18 LAB — CBC
HCT: 45.6 % (ref 39.0–52.0)
MCV: 87.7 fL (ref 78.0–100.0)
Platelets: 202 10*3/uL (ref 150–400)
RBC: 5.2 MIL/uL (ref 4.22–5.81)
RDW: 13.5 % (ref 11.5–15.5)
WBC: 5.2 10*3/uL (ref 4.0–10.5)

## 2010-09-18 LAB — BASIC METABOLIC PANEL
CO2: 29 mEq/L (ref 19–32)
Chloride: 98 mEq/L (ref 96–112)
Creatinine, Ser: 2.06 mg/dL — ABNORMAL HIGH (ref 0.50–1.35)
GFR calc Af Amer: 40 mL/min — ABNORMAL LOW (ref >60)
Potassium: 3.9 mEq/L (ref 3.5–5.1)

## 2010-09-18 LAB — MAGNESIUM: Magnesium: 2.2 mg/dL (ref 1.5–2.5)

## 2010-09-18 LAB — PROTIME-INR: Prothrombin Time: 13.7 seconds (ref 11.6–15.2)

## 2010-09-18 LAB — PRO B NATRIURETIC PEPTIDE: Pro B Natriuretic peptide (BNP): 1772 pg/mL — ABNORMAL HIGH (ref 0–125)

## 2010-09-18 LAB — DIGOXIN LEVEL: Digoxin Level: 0.7 ng/mL — ABNORMAL LOW (ref 0.8–2.0)

## 2010-09-18 LAB — HEPARIN LEVEL (UNFRACTIONATED): Heparin Unfractionated: 0.57 [IU]/mL (ref 0.30–0.70)

## 2010-09-19 LAB — CBC
MCH: 30.7 pg (ref 26.0–34.0)
MCHC: 35.4 g/dL (ref 30.0–36.0)
MCV: 86.8 fL (ref 78.0–100.0)
Platelets: 212 10*3/uL (ref 150–400)
RDW: 13.4 % (ref 11.5–15.5)

## 2010-09-19 LAB — HEPARIN LEVEL (UNFRACTIONATED): Heparin Unfractionated: 0.58 IU/mL (ref 0.30–0.70)

## 2010-09-20 LAB — CBC
HCT: 44.9 % (ref 39.0–52.0)
MCH: 31.2 pg (ref 26.0–34.0)
MCHC: 36.1 g/dL — ABNORMAL HIGH (ref 30.0–36.0)
MCV: 86.3 fL (ref 78.0–100.0)
Platelets: 213 10*3/uL (ref 150–400)
RDW: 13.5 % (ref 11.5–15.5)
WBC: 4.9 10*3/uL (ref 4.0–10.5)

## 2010-09-20 LAB — BASIC METABOLIC PANEL
BUN: 41 mg/dL — ABNORMAL HIGH (ref 6–23)
Calcium: 10.3 mg/dL (ref 8.4–10.5)
Chloride: 98 mEq/L (ref 96–112)
Creatinine, Ser: 2.19 mg/dL — ABNORMAL HIGH (ref 0.50–1.35)
GFR calc Af Amer: 37 mL/min — ABNORMAL LOW (ref >60)

## 2010-09-20 LAB — PROTIME-INR: INR: 1.43 (ref 0.00–1.49)

## 2010-09-21 ENCOUNTER — Inpatient Hospital Stay (HOSPITAL_COMMUNITY): Payer: PRIVATE HEALTH INSURANCE

## 2010-09-21 LAB — BASIC METABOLIC PANEL
BUN: 34 — ABNORMAL HIGH
CO2: 28
CO2: 29
Calcium: 10
Calcium: 9.4
Calcium: 9.5
Calcium: 9.6
Chloride: 103
Chloride: 99
Creatinine, Ser: 1.53 — ABNORMAL HIGH
Creatinine, Ser: 2.05 — ABNORMAL HIGH
GFR calc Af Amer: 46 — ABNORMAL LOW
GFR calc Af Amer: 46 — ABNORMAL LOW
GFR calc Af Amer: 54 — ABNORMAL LOW
GFR calc non Af Amer: 34 — ABNORMAL LOW
GFR calc non Af Amer: 38 — ABNORMAL LOW
GFR calc non Af Amer: 45 — ABNORMAL LOW
GFR calc non Af Amer: 47 — ABNORMAL LOW
Glucose, Bld: 109 — ABNORMAL HIGH
Glucose, Bld: 78
Glucose, Bld: 82
Glucose, Bld: 90
Glucose, Bld: 96
Potassium: 3.6
Potassium: 3.7
Potassium: 3.7
Potassium: 3.9
Sodium: 137
Sodium: 138
Sodium: 138

## 2010-09-21 LAB — CBC
HCT: 46.3 % (ref 39.0–52.0)
HCT: 39.1
HCT: 42
HCT: 43.2
HCT: 44
Hemoglobin: 15.7 g/dL (ref 13.0–17.0)
Hemoglobin: 14.7
Hemoglobin: 14.9
MCHC: 33.9 g/dL (ref 30.0–36.0)
MCHC: 34.6
MCHC: 34.7
MCV: 86.9 fL (ref 78.0–100.0)
MCV: 89
MCV: 89.7
Platelets: 258
RBC: 4.4
RBC: 4.71
RBC: 4.81
RDW: 13.3
RDW: 13.5
RDW: 13.9
WBC: 4.8
WBC: 6.2

## 2010-09-21 LAB — COMPREHENSIVE METABOLIC PANEL
BUN: 25 — ABNORMAL HIGH
CO2: 23
Calcium: 9.3
Chloride: 108
Creatinine, Ser: 1.55 — ABNORMAL HIGH
GFR calc non Af Amer: 47 — ABNORMAL LOW
Glucose, Bld: 107 — ABNORMAL HIGH
Total Bilirubin: 1.2

## 2010-09-21 LAB — HEPARIN LEVEL (UNFRACTIONATED)
Heparin Unfractionated: 0.77 IU/mL — ABNORMAL HIGH (ref 0.30–0.70)
Heparin Unfractionated: 0.53 IU/mL (ref 0.30–0.70)

## 2010-09-21 LAB — DIFFERENTIAL
Basophils Absolute: 0
Basophils Absolute: 0
Basophils Relative: 1
Eosinophils Relative: 8 — ABNORMAL HIGH
Eosinophils Relative: 8 — ABNORMAL HIGH
Lymphocytes Relative: 39
Monocytes Absolute: 1
Monocytes Relative: 17 — ABNORMAL HIGH
Neutro Abs: 1.8
Neutro Abs: 2.1
Neutrophils Relative %: 44

## 2010-09-21 LAB — CARDIAC PANEL(CRET KIN+CKTOT+MB+TROPI)
CK, MB: 5.7 — ABNORMAL HIGH
Relative Index: 2.7 — ABNORMAL HIGH
Relative Index: 2.8 — ABNORMAL HIGH
Total CK: 145
Total CK: 203
Troponin I: 0.04

## 2010-09-21 LAB — DIGOXIN LEVEL
Digoxin Level: 0.5 — ABNORMAL LOW
Digoxin Level: <0.8 — ABNORMAL LOW

## 2010-09-21 LAB — POCT CARDIAC MARKERS
CKMB, poc: 4.8
Myoglobin, poc: 191
Operator id: 4074
Troponin i, poc: <0.05

## 2010-09-21 LAB — LIPID PANEL
Cholesterol: 179
Total CHOL/HDL Ratio: 2.6

## 2010-09-21 LAB — PROTIME-INR
INR: 1.89 — ABNORMAL HIGH (ref 0.00–1.49)
INR: 1.1

## 2010-09-22 LAB — EXPECTORATED SPUTUM ASSESSMENT W GRAM STAIN, RFLX TO RESP C

## 2010-09-22 LAB — CBC
HCT: 48.1
Hemoglobin: 16.2
MCHC: 36.3 g/dL — ABNORMAL HIGH (ref 30.0–36.0)
Platelets: 216 10*3/uL (ref 150–400)
Platelets: 253
RDW: 13.3 % (ref 11.5–15.5)
RDW: 13.1
WBC: 5 10*3/uL (ref 4.0–10.5)
WBC: 8.1

## 2010-09-22 LAB — B-NATRIURETIC PEPTIDE (CONVERTED LAB): Pro B Natriuretic peptide (BNP): 383 — ABNORMAL HIGH

## 2010-09-22 LAB — BASIC METABOLIC PANEL
BUN: 21
Calcium: 10.1
Chloride: 95 mEq/L — ABNORMAL LOW (ref 96–112)
GFR calc Af Amer: 37 mL/min — ABNORMAL LOW (ref >60)
GFR calc non Af Amer: 30 mL/min — ABNORMAL LOW (ref >60)
GFR calc non Af Amer: 52 — ABNORMAL LOW
Glucose, Bld: 103 — ABNORMAL HIGH
Potassium: 3.9 mEq/L (ref 3.5–5.1)
Potassium: 4.1
Sodium: 131 — ABNORMAL LOW

## 2010-09-22 LAB — PRO B NATRIURETIC PEPTIDE: Pro B Natriuretic peptide (BNP): 616 pg/mL — ABNORMAL HIGH (ref 0–125)

## 2010-09-22 LAB — CULTURE, BLOOD (ROUTINE X 2)
Culture: NO GROWTH
Culture: NO GROWTH

## 2010-09-22 LAB — CULTURE, RESPIRATORY W GRAM STAIN: Culture: NORMAL

## 2010-09-22 LAB — URINE CULTURE: Colony Count: NO GROWTH

## 2010-09-22 LAB — PROTIME-INR
INR: 2.2 — ABNORMAL HIGH (ref 0.00–1.49)
Prothrombin Time: 24.8 seconds — ABNORMAL HIGH (ref 11.6–15.2)

## 2010-09-22 LAB — DIFFERENTIAL
Basophils Absolute: 0
Eosinophils Absolute: 0.2
Eosinophils Relative: 3
Lymphs Abs: 2.6
Neutrophils Relative %: 51

## 2010-09-22 LAB — CK TOTAL AND CKMB (NOT AT ARMC)
CK, MB: 2.9
Relative Index: 2.1

## 2010-09-22 LAB — HEPARIN LEVEL (UNFRACTIONATED): Heparin Unfractionated: 0.46 IU/mL (ref 0.30–0.70)

## 2010-09-22 LAB — TROPONIN I: Troponin I: 0.04

## 2010-09-23 LAB — CBC
HCT: 41.2
Hemoglobin: 14.3
MCHC: 34.7
RBC: 4.62

## 2010-09-23 LAB — DIFFERENTIAL
Basophils Relative: 2 — ABNORMAL HIGH
Eosinophils Relative: 10 — ABNORMAL HIGH
Lymphocytes Relative: 35
Monocytes Absolute: 0.8
Monocytes Relative: 15 — ABNORMAL HIGH
Neutro Abs: 2.1

## 2010-09-23 LAB — BASIC METABOLIC PANEL
CO2: 26
Calcium: 9.4
GFR calc Af Amer: >60
Glucose, Bld: 119 — ABNORMAL HIGH
Potassium: 4.2
Sodium: 143

## 2010-09-23 LAB — POCT CARDIAC MARKERS
CKMB, poc: 6.2
Myoglobin, poc: 119
Operator id: 2725
Troponin i, poc: <0.05

## 2010-09-26 LAB — DIFFERENTIAL
Basophils Absolute: 0.1
Basophils Relative: 1
Neutro Abs: 2.7
Neutrophils Relative %: 43

## 2010-09-26 LAB — I-STAT 8, (EC8 V) (CONVERTED LAB)
Acid-base deficit: 1
Hemoglobin: 15
Potassium: 4.7
Sodium: 137
TCO2: 22

## 2010-09-26 LAB — URINE MICROSCOPIC-ADD ON

## 2010-09-26 LAB — CULTURE, BLOOD (ROUTINE X 2)
Culture: NO GROWTH
Culture: NO GROWTH

## 2010-09-26 LAB — URINALYSIS, ROUTINE W REFLEX MICROSCOPIC
Hgb urine dipstick: NEGATIVE
Protein, ur: 100 — AB
Urobilinogen, UA: 0.2

## 2010-09-26 LAB — URINE CULTURE: Colony Count: NO GROWTH

## 2010-09-26 LAB — CBC
MCHC: 34.1
RDW: 13.8

## 2010-09-26 LAB — POCT I-STAT CREATININE
Creatinine, Ser: 1.3
Operator id: 295021

## 2010-09-27 ENCOUNTER — Emergency Department (HOSPITAL_COMMUNITY): Payer: PRIVATE HEALTH INSURANCE

## 2010-09-27 ENCOUNTER — Emergency Department (HOSPITAL_COMMUNITY)
Admission: EM | Admit: 2010-09-27 | Discharge: 2010-09-27 | Disposition: A | Discharge status: Home / Self Care | Payer: PRIVATE HEALTH INSURANCE | Attending: Emergency Medicine | Admitting: Emergency Medicine

## 2010-09-27 DIAGNOSIS — M79609 Pain in unspecified limb: Secondary | ICD-10-CM

## 2010-09-27 DIAGNOSIS — I519 Heart disease, unspecified: Secondary | ICD-10-CM | POA: Insufficient documentation

## 2010-09-27 DIAGNOSIS — I4891 Unspecified atrial fibrillation: Secondary | ICD-10-CM | POA: Insufficient documentation

## 2010-09-27 DIAGNOSIS — Z7901 Long term (current) use of anticoagulants: Secondary | ICD-10-CM | POA: Insufficient documentation

## 2010-09-27 DIAGNOSIS — Z950 Presence of cardiac pacemaker: Secondary | ICD-10-CM | POA: Insufficient documentation

## 2010-09-27 DIAGNOSIS — M25579 Pain in unspecified ankle and joints of unspecified foot: Secondary | ICD-10-CM | POA: Insufficient documentation

## 2010-09-27 DIAGNOSIS — I509 Heart failure, unspecified: Secondary | ICD-10-CM | POA: Insufficient documentation

## 2010-09-27 LAB — BASIC METABOLIC PANEL
BUN: 35 — ABNORMAL HIGH
CO2: 26
Calcium: 10
Calcium: 10
Calcium: 9.5
Creatinine, Ser: 1.71 — ABNORMAL HIGH
GFR calc Af Amer: 50 — ABNORMAL LOW
GFR calc non Af Amer: 34 — ABNORMAL LOW
GFR calc non Af Amer: 34 — ABNORMAL LOW
Glucose, Bld: 96
Glucose, Bld: 98
Sodium: 138
Sodium: 138

## 2010-09-27 LAB — POCT I-STAT, CHEM 8
Calcium, Ion: 1.27 mmol/L (ref 1.12–1.32)
Glucose, Bld: 108 mg/dL — ABNORMAL HIGH (ref 70–99)
HCT: 47 % (ref 39.0–52.0)
Hemoglobin: 16 g/dL (ref 13.0–17.0)

## 2010-09-27 LAB — B-NATRIURETIC PEPTIDE (CONVERTED LAB)
Pro B Natriuretic peptide (BNP): 1097 — ABNORMAL HIGH
Pro B Natriuretic peptide (BNP): 1438 — ABNORMAL HIGH

## 2010-09-27 LAB — COMPREHENSIVE METABOLIC PANEL
AST: 50 — ABNORMAL HIGH
BUN: 18
CO2: 24
Chloride: 106
Creatinine, Ser: 1.49
GFR calc non Af Amer: 49 — ABNORMAL LOW
Glucose, Bld: 115 — ABNORMAL HIGH
Total Bilirubin: 0.6

## 2010-09-27 LAB — CBC
HCT: 40.7
Hemoglobin: 13.9
MCV: 91.5
RBC: 4.45
WBC: 5.2

## 2010-09-27 LAB — PROTIME-INR: Prothrombin Time: 35.9 seconds — ABNORMAL HIGH (ref 11.6–15.2)

## 2010-09-29 LAB — BASIC METABOLIC PANEL
BUN: 21
BUN: 23
CO2: 26
CO2: 28
Chloride: 103
Chloride: 104
Chloride: 106
Creatinine, Ser: 1.56 — ABNORMAL HIGH
Creatinine, Ser: 1.61 — ABNORMAL HIGH
GFR calc Af Amer: 53 — ABNORMAL LOW
Potassium: 3.8

## 2010-09-29 LAB — COMPREHENSIVE METABOLIC PANEL
ALT: 87 — ABNORMAL HIGH
BUN: 21
CO2: 25
Calcium: 9.4
Creatinine, Ser: 1.5
GFR calc non Af Amer: 48 — ABNORMAL LOW
Glucose, Bld: 116 — ABNORMAL HIGH

## 2010-09-29 LAB — DIFFERENTIAL
Eosinophils Absolute: 0.3
Eosinophils Relative: 7 — ABNORMAL HIGH
Lymphs Abs: 2.2
Monocytes Absolute: 0.6
Monocytes Relative: 13 — ABNORMAL HIGH

## 2010-09-29 LAB — B-NATRIURETIC PEPTIDE (CONVERTED LAB)
Pro B Natriuretic peptide (BNP): 455 — ABNORMAL HIGH
Pro B Natriuretic peptide (BNP): 601 — ABNORMAL HIGH
Pro B Natriuretic peptide (BNP): 672 — ABNORMAL HIGH

## 2010-09-29 LAB — CBC
HCT: 41.7
MCHC: 33.9
MCV: 91.1
RBC: 4.57
WBC: 4.9

## 2010-09-29 LAB — PROTIME-INR
INR: 0.9
Prothrombin Time: 12.7

## 2010-09-29 LAB — POCT CARDIAC MARKERS: Operator id: 261601

## 2010-09-29 LAB — APTT: aPTT: 37

## 2010-09-29 LAB — CARDIAC PANEL(CRET KIN+CKTOT+MB+TROPI)
CK, MB: 3.2
Total CK: 107
Troponin I: 0.03

## 2010-09-29 LAB — DIGOXIN LEVEL: Digoxin Level: <0.2 — ABNORMAL LOW

## 2010-09-30 LAB — CBC
MCHC: 34.5
MCV: 92.8
Platelets: 207

## 2010-09-30 LAB — COMPREHENSIVE METABOLIC PANEL
AST: 40 — ABNORMAL HIGH
Albumin: 3.5
Calcium: 9.8
Creatinine, Ser: 1.46
GFR calc Af Amer: >60

## 2010-09-30 LAB — BASIC METABOLIC PANEL
CO2: 29
CO2: 29
Calcium: 9.4
Calcium: 9.4
Calcium: 9.6
Chloride: 100
Chloride: 104
Creatinine, Ser: 1.95 — ABNORMAL HIGH
GFR calc Af Amer: 39 — ABNORMAL LOW
GFR calc Af Amer: 41 — ABNORMAL LOW
GFR calc Af Amer: 43 — ABNORMAL LOW
GFR calc non Af Amer: 36 — ABNORMAL LOW
Sodium: 136
Sodium: 141

## 2010-09-30 LAB — B-NATRIURETIC PEPTIDE (CONVERTED LAB): Pro B Natriuretic peptide (BNP): 276 — ABNORMAL HIGH

## 2010-10-03 LAB — GLUCOSE, CAPILLARY
Glucose-Capillary: 101 — ABNORMAL HIGH
Glucose-Capillary: 115 — ABNORMAL HIGH

## 2010-10-12 LAB — DIFFERENTIAL
Basophils Absolute: 0
Basophils Relative: 1
Eosinophils Absolute: 0.7
Eosinophils Relative: 12 — ABNORMAL HIGH
Lymphocytes Relative: 34
Lymphs Abs: 1.9
Monocytes Absolute: 0.7
Monocytes Relative: 13 — ABNORMAL HIGH
Neutro Abs: 2.3
Neutrophils Relative %: 41 — ABNORMAL LOW

## 2010-10-12 LAB — POCT CARDIAC MARKERS
CKMB, poc: 2.8
CKMB, poc: 4
Myoglobin, poc: 130
Myoglobin, poc: 93.1
Operator id: 4708
Operator id: 4708
Troponin i, poc: <0.05
Troponin i, poc: <0.05

## 2010-10-12 LAB — BASIC METABOLIC PANEL WITH GFR
CO2: 25
GFR calc Af Amer: >60
GFR calc non Af Amer: 58 — ABNORMAL LOW
Glucose, Bld: 110 — ABNORMAL HIGH
Potassium: 4.2
Sodium: 140

## 2010-10-12 LAB — PROTIME-INR
INR: 1
Prothrombin Time: 13.3

## 2010-10-12 LAB — CBC
HCT: 41.3
Hemoglobin: 14.2
MCHC: 34.5
MCV: 89.7
Platelets: 222
RBC: 4.6
RDW: 13
WBC: 5.6

## 2010-10-12 LAB — APTT: aPTT: 34

## 2010-10-12 LAB — BASIC METABOLIC PANEL
BUN: 18
Calcium: 9.4
Chloride: 108
Creatinine, Ser: 1.29

## 2010-10-12 LAB — B-NATRIURETIC PEPTIDE (CONVERTED LAB): Pro B Natriuretic peptide (BNP): 1150 — ABNORMAL HIGH

## 2010-10-12 LAB — DIGOXIN LEVEL: Digoxin Level: <0.2 — ABNORMAL LOW

## 2010-10-12 NOTE — Discharge Summary (Signed)
[name], [name]             ACCOUNT NO.:  [id number]  MEDICAL RECORD NO.:  [id number]  LOCATION:  3738                         FACILITY:  [facility]  PHYSICIAN:  Eduardo Osier. [name], M.D. DATE OF BIRTH:  Sep 24, 1950  DATE OF ADMISSION:  09/16/2010 DATE OF DISCHARGE:  09/22/2010                              DISCHARGE SUMMARY   ADMITTING DIAGNOSES: 1. Decompensated systolic heart failure. 2. New onset atrial fibrillation. 3. History of paroxysmal atrial fibrillation in the past. 4. Atypical chest pain, rule out myocardial infarction. 5. Severe nonischemic dilated cardiomyopathy. 6. Hypertension. 7. History of nonsustained ventricular tachycardia and paroxysmal     atrial fibrillation in the past. 8. Hypercholesteremia. 9. Gastroesophageal reflux disease. 10.Chronic kidney disease, 11.Glucose intolerance. 12.History of sarcoidosis.  DISCHARGE DIAGNOSES: 1. Compensated systolic heart failure. 2. Chronic atrial fibrillation, status post atypical chest pain,     myocardial infarction ruled out. 3. Severe nonischemic dilated cardiomyopathy. 4. Hypertension. 5. History of nonsustained ventricular tachycardia in the past. 6. Hypercholesteremia. 7. Gastroesophageal reflux disease. 8. Chronic kidney disease. 9. Glucose intolerance. 10.History of sarcoidosis.  DISCHARGE HOME MEDICATIONS: 1. Amiodarone 200 mg 1 tablet daily. 2. Nexium 40 mg 1 capsule daily. 3. Warfarin 10 mg 1 tablet daily in the evening. 4. Carvedilol 6.25 mg 1 tablet twice daily. 5. Digoxin 0.25 mg 1 tablet daily. 6. Furosemide 80 mg 1 tablet twice daily. 7. Spironolactone 25 mg 1 tablet daily. 8. Crestor 5 mg 1 tablet daily. 9. Carafate 1 gram every 6 hours. 10.Allopurinol 100 mg 1 tablet daily.  DIET:  Low-salt, low-cholesterol.  The patient has been advised to avoid sweets.  ACTIVITY:  Increase activity slowly as tolerated.  FOLLOW UP:  With me in 1 week.  We will monitor his pro-time and  digoxin level in 1 week.  Heart failure instructions have been given.  The patient has been advised to weigh daily and chart.  The patient also has been advised if he notices any difficulty breathing, dry hacking cough or weight gain of 2 pounds in 1 day of 5 pounds in 1 week should call my office immediately.  CONDITION AT DISCHARGE:  Stable.  BRIEF HISTORY AND HOSPITAL COURSE:  [name] is a 60 year old black male with past medical history significant for multiple medical problems i.e. severe nonischemic cardiomyopathy, status post ICD in the past, history of recurrent systolic heart failure, hypertension, history of nonsustained VT in the past, paroxysmal AFib in the past, glucose intolerance, GERD, chronic kidney disease, history of sarcoidosis.  He came to the ER complaining of progressive increasing shortness of breath associated with pleuritic chest pain and abdominal pain and abdominal and ankle swelling for last few days.  The patient also complains of palpitation off and on for last 2 days associated with feeling weak and tired.  He states he missed Coumadin for last 2 days.  Denies any excessive salty food intake or excessive fluid intake.  Denies any cough, fever, or chills.  States has gained approximately 15 pounds in last few days.  PAST MEDICAL HISTORY:  As above.  PAST SURGICAL HISTORY:  ICD in the past.  ALLERGIES:  NO KNOWN DRUG ALLERGIES.  MEDICATION AT HOME:  He was on  Coreg, digoxin, Lasix, Aldactone, amiodarone, Crestor, Coumadin, Protonix, Carafate, and allopurinol.  SOCIAL HISTORY:  He is divorced, on disability.  No history of smoking or alcohol abuse.  FAMILY HISTORY:  Positive for coronary artery disease.  PHYSICAL EXAMINATION:  GENERAL:  He was alert and oriented x3, in no acute distress. VITAL SIGNS:  Blood pressure was 125/102, pulse was 109, irregularly irregular. HEENT:  Conjunctivae was pink. NECK:  Supple, positive JVD. LUNGS:  He has  bibasilar decreased breath sounds with rales. CARDIOVASCULAR:  Irregularly irregular.  S1 and S2 was normal.  There was soft systolic murmur and S3 gallop. ABDOMEN:  Soft.  Bowel sounds were present, mildly distended, nontender. EXTREMITIES:  There was no clubbing or cyanosis.  There was trace edema.  LABORATORY DATA:  Hemoglobin 13.3, hematocrit 38.3, white count 5.7. Sodium 141, potassium 4.0, glucose 90, BUN 25, creatinine 1.65.  Pro BNP was 6407 on September 14.  On September 16, Pro BNP was down to 1772. On September 18, Pro BNP was 1066.  Today, pro BNP is 616, which is trending down.  Today, hemoglobin is 17.6, hematocrit 48.5, white count 5.0.  PT/INR today is 24.8 with INR of 2.20.  Three sets of cardiac enzymes were negative.  His digoxin level was 0.7.  Cholesterol was 178, LDL 74, HDL 71, and triglycerides were 163.  Chest x-ray showed vascular congestion with mild cardiomegaly, mildly increased interstitial marking raising question of mild interstitial edema.  Repeat chest x-ray done this morning showed no evidence of acute cardiopulmonary disease.  BRIEF HOSPITAL COURSE:  The patient was admitted to telemetry unit.  MI was ruled out by serial enzymes and EKG.  The patient was started on IV heparin and Coumadin per pharmacy protocol.  The patient did not have any episodes of anginal chest pain during the hospital stay.  His 3 sets of cardiac enzymes were negative.  The patient was continued on p.o. amiodarone, had brief episodes of nonsustained few beats of V-tach, but remained asymptomatic.  His p.o. Lasix was switched to IV Lasix with good diuresis.  His pro BNP is trending down.  The patient has been ambulating in hallway without any problems.  The patient remained in AFib during the hospital stay with controlled ventricular response.  The patient's INR is in therapeutic range.  The patient will be discharged home on above medications and will be followed up in my  office in 1 week.  We will monitor his PT/INR closely.  The patient was discussed at length regarding importance of taking his Coumadin regularly as now he is in chronic AFib with severely depressed LV systolic function.  The patient is at high risk of cardiogenic CVA if he is noncompliant to his medications especially his Coumadin.  The patient understands the risk. The patient will need close follow-up.  He will be followed up in my office next week.     Eduardo Osier. [name], M.D.     [facility]  D:  09/22/2010  T:  09/22/2010  Job:  [id number]  Electronically Signed by Rinaldo Cloud M.D. on 10/12/2010 07:46:43 PM

## 2010-10-17 ENCOUNTER — Emergency Department (HOSPITAL_COMMUNITY)
Admission: EM | Admit: 2010-10-17 | Discharge: 2010-10-18 | Disposition: A | Discharge status: Home / Self Care | Payer: PRIVATE HEALTH INSURANCE | Attending: Emergency Medicine | Admitting: Emergency Medicine

## 2010-10-17 ENCOUNTER — Emergency Department (HOSPITAL_COMMUNITY): Payer: PRIVATE HEALTH INSURANCE

## 2010-10-17 DIAGNOSIS — Z7901 Long term (current) use of anticoagulants: Secondary | ICD-10-CM | POA: Insufficient documentation

## 2010-10-17 DIAGNOSIS — R0602 Shortness of breath: Secondary | ICD-10-CM | POA: Insufficient documentation

## 2010-10-17 DIAGNOSIS — Z950 Presence of cardiac pacemaker: Secondary | ICD-10-CM | POA: Insufficient documentation

## 2010-10-17 DIAGNOSIS — I4891 Unspecified atrial fibrillation: Secondary | ICD-10-CM | POA: Insufficient documentation

## 2010-10-17 DIAGNOSIS — R0989 Other specified symptoms and signs involving the circulatory and respiratory systems: Secondary | ICD-10-CM | POA: Insufficient documentation

## 2010-10-17 DIAGNOSIS — I519 Heart disease, unspecified: Secondary | ICD-10-CM | POA: Insufficient documentation

## 2010-10-17 DIAGNOSIS — R0609 Other forms of dyspnea: Secondary | ICD-10-CM | POA: Insufficient documentation

## 2010-10-17 DIAGNOSIS — R079 Chest pain, unspecified: Secondary | ICD-10-CM | POA: Insufficient documentation

## 2010-10-17 DIAGNOSIS — I509 Heart failure, unspecified: Secondary | ICD-10-CM | POA: Insufficient documentation

## 2010-10-17 LAB — COMPREHENSIVE METABOLIC PANEL
ALT: 54 U/L — ABNORMAL HIGH (ref 0–53)
AST: 53 U/L — ABNORMAL HIGH (ref 0–37)
Calcium: 10.1 mg/dL (ref 8.4–10.5)
GFR calc Af Amer: 50 mL/min — ABNORMAL LOW (ref >90)
Sodium: 138 mEq/L (ref 135–145)
Total Protein: 7.8 g/dL (ref 6.0–8.3)

## 2010-10-17 LAB — PRO B NATRIURETIC PEPTIDE: Pro B Natriuretic peptide (BNP): 4160 pg/mL — ABNORMAL HIGH (ref 0–125)

## 2010-10-17 LAB — CBC
Platelets: 196 10*3/uL (ref 150–400)
RDW: 13.2 % (ref 11.5–15.5)
WBC: 5.1 10*3/uL (ref 4.0–10.5)

## 2010-10-17 LAB — DIFFERENTIAL
Basophils Absolute: 0 10*3/uL (ref 0.0–0.1)
Basophils Relative: 1 % (ref 0–1)
Eosinophils Absolute: 0.4 10*3/uL (ref 0.0–0.7)
Eosinophils Relative: 8 % — ABNORMAL HIGH (ref 0–5)

## 2010-10-17 LAB — CK TOTAL AND CKMB (NOT AT ARMC)
CK, MB: 4.9 ng/mL — ABNORMAL HIGH (ref 0.3–4.0)
Relative Index: 2.6 — ABNORMAL HIGH (ref 0.0–2.5)

## 2010-10-17 LAB — POCT I-STAT TROPONIN I: Troponin i, poc: 0.02 ng/mL (ref 0.00–0.08)

## 2010-10-17 LAB — D-DIMER, QUANTITATIVE: D-Dimer, Quant: 0.45 ug/mL-FEU (ref 0.00–0.48)

## 2010-10-17 LAB — PROTIME-INR: Prothrombin Time: 15.3 seconds — ABNORMAL HIGH (ref 11.6–15.2)

## 2010-10-30 IMAGING — CR DG CHEST 1V PORT
1 series · 1 of 1 positions shown · non-contrast
Comparison: 03/10/2009 and earlier.

CLINICAL DATA: 59-year-old male with chest pain, weakness,
shortness of breath.

PORTABLE CHEST - 1 VIEW

[view not recorded]
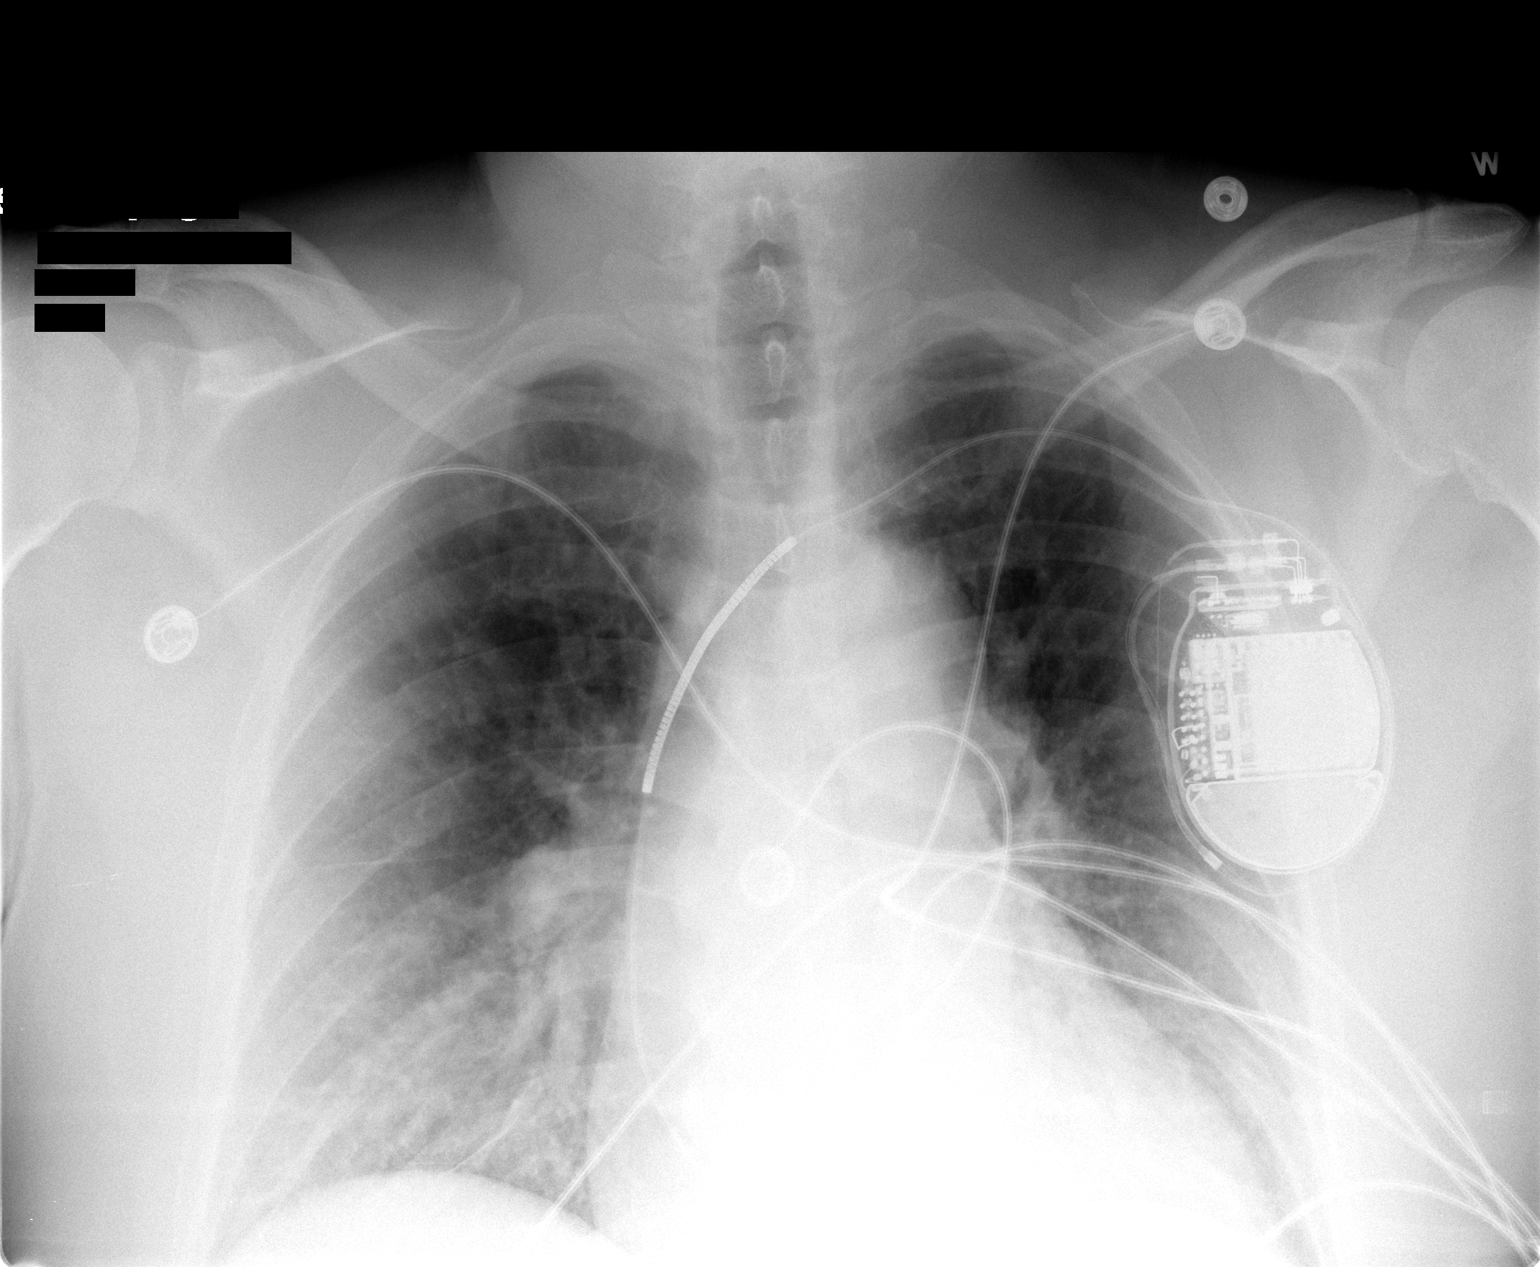

[1 of 1 positions shown; findings below may reference images not displayed]

FINDINGS: Portable AP semi upright view at 8395 hours.  Stable left
chest cardiac AICD.  Stable cardiac size and mediastinal contours.
Increased interstitial markings bilaterally, basilar predominant.
No pneumothorax, pleural effusion or consolidation. Visualized
tracheal air column is within normal limits.
IMPRESSION: Increased basilar interstitial markings suggestive of pulmonary
vascular congestion.  Query early interstitial edema.

## 2010-11-28 IMAGING — CR DG CHEST 1V PORT
1 series · 1 of 1 positions shown · non-contrast
Comparison: 05/18/2009

CLINICAL DATA: Chest pain/short of breath

PORTABLE CHEST - 1 VIEW

[view not recorded]
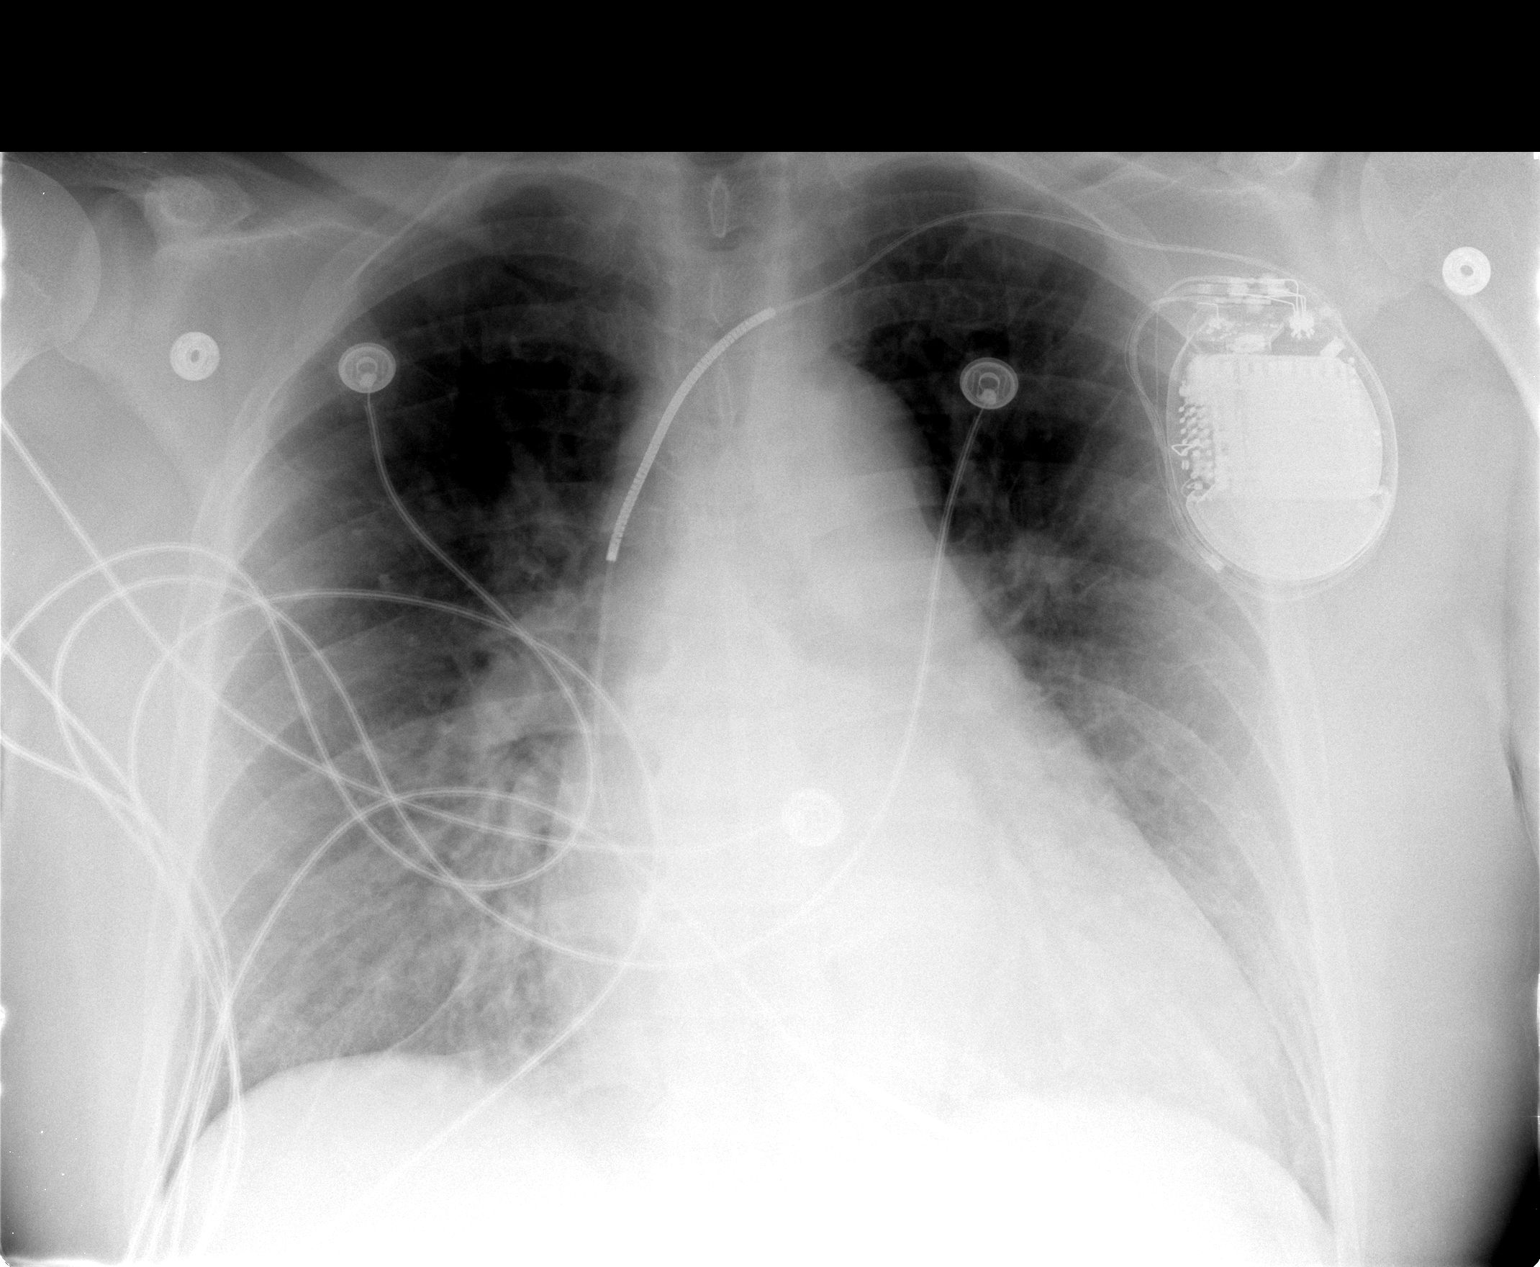

[1 of 1 positions shown; findings below may reference images not displayed]

FINDINGS: Heart mildly enlarged.  There is mild vascular congestion
without significant pleural fluid or airspace consolidation in one-
view.  A single lead AICD is unchanged.
IMPRESSION: Mild congestive heart failure - no lung consolidation or
significant pleural fluid in one-view.

## 2010-12-01 IMAGING — CR DG ABDOMEN 2V
3 series · 3 of 3 positions shown · non-contrast
Comparison: None.

CLINICAL DATA: Abdominal pain.

ABDOMEN - 2 VIEW

[w abdomen upright *]
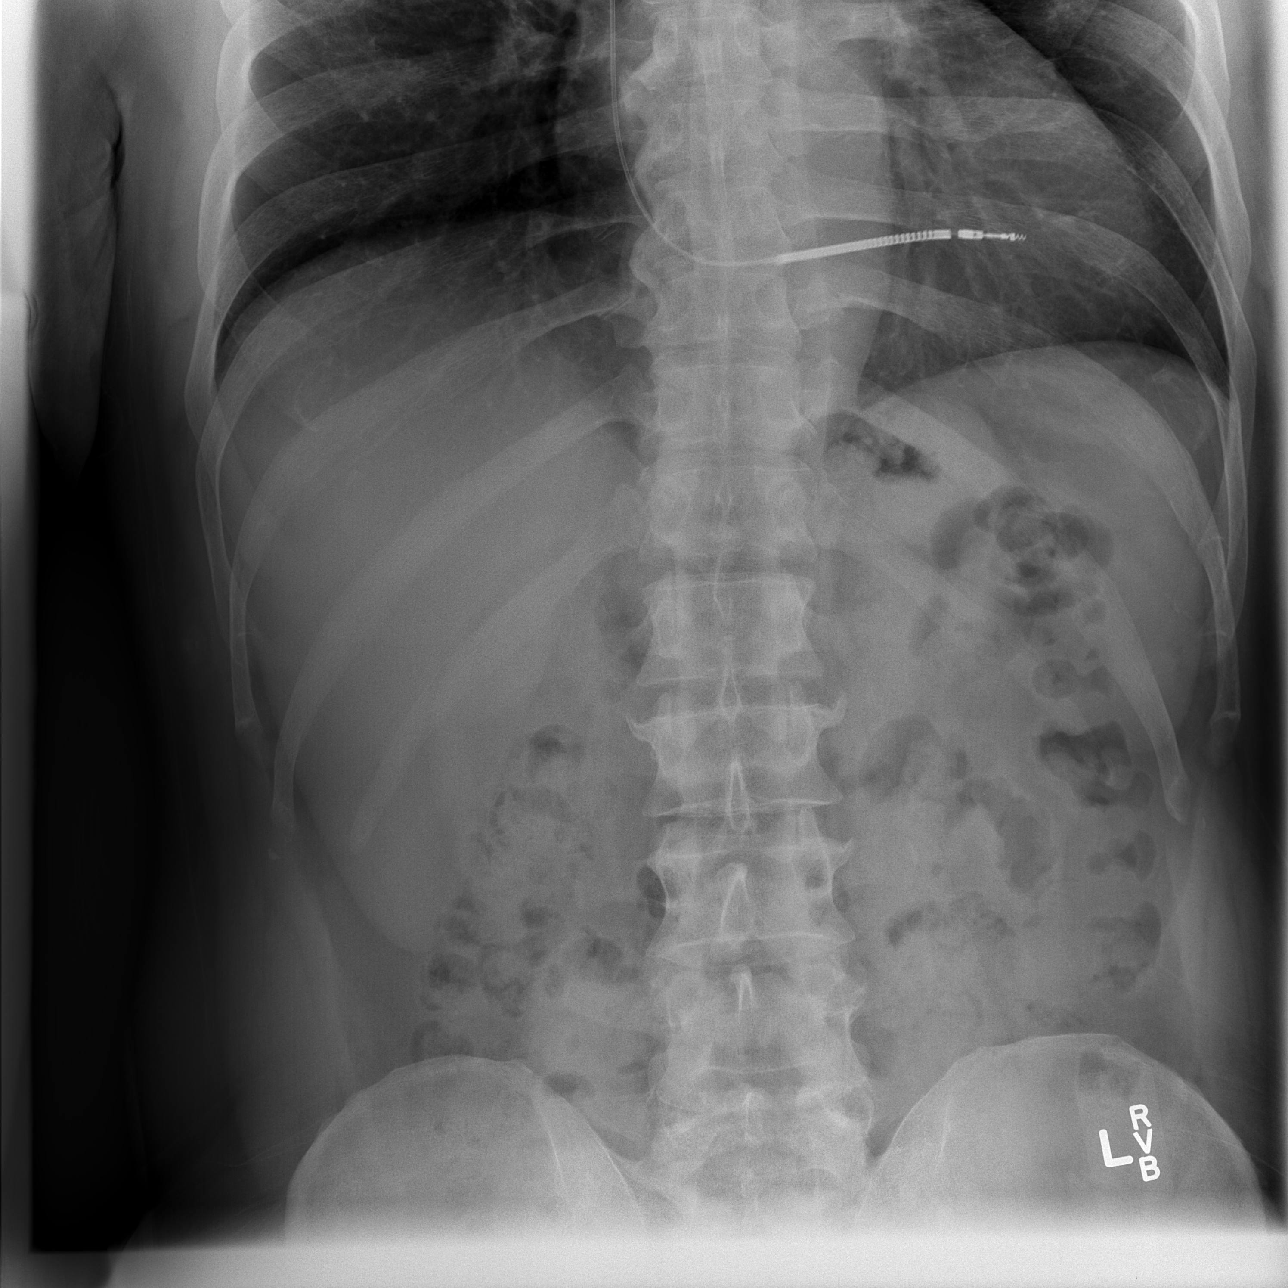

[t abdomen supine (1 of 2)]
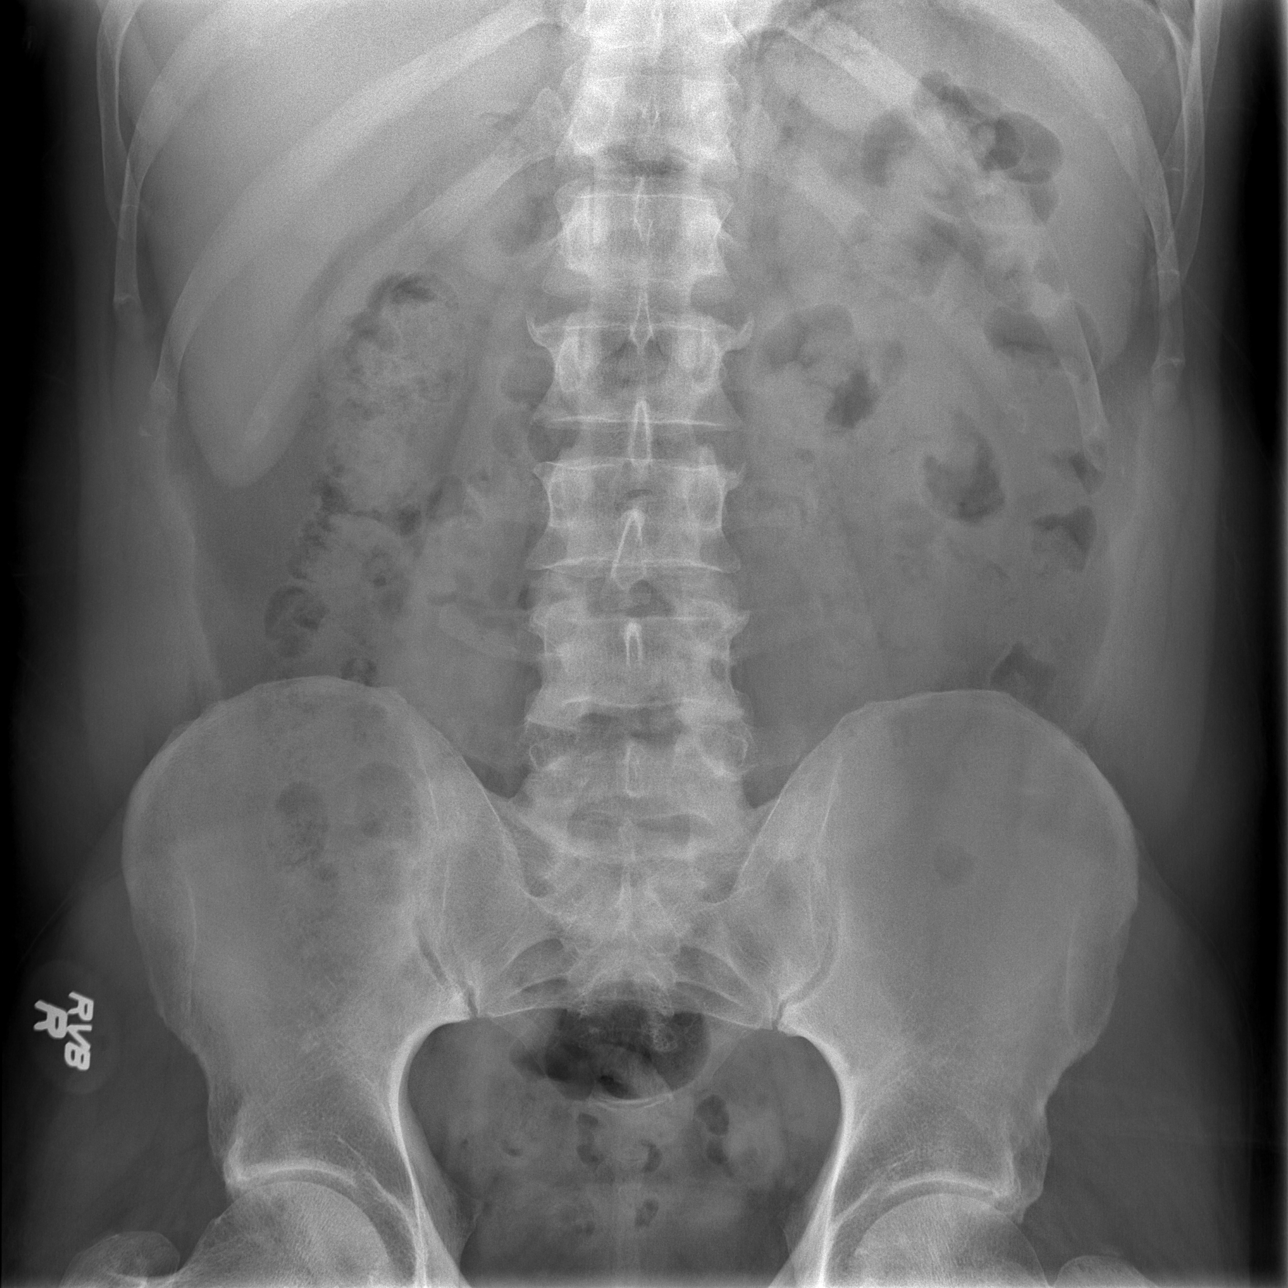

[t abdomen supine (2 of 2)]
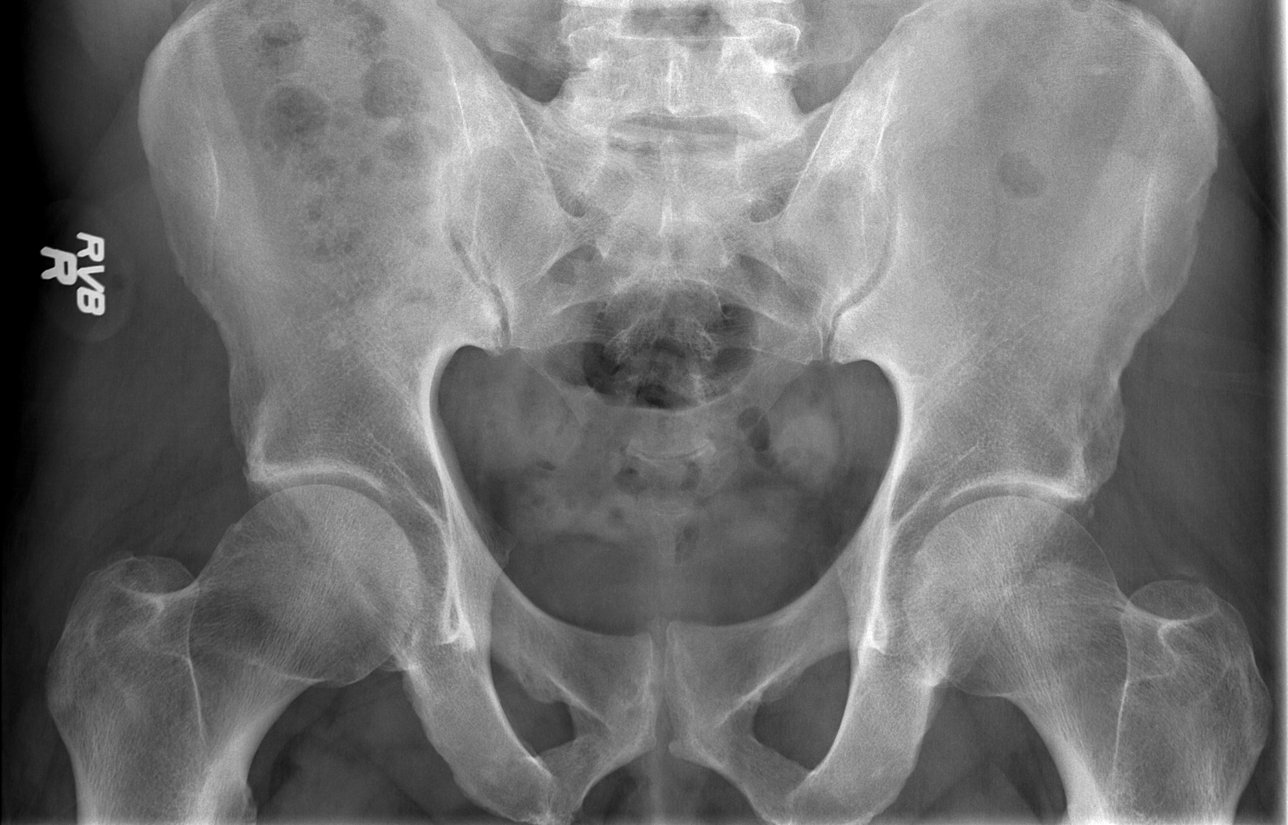

[3 of 3 positions shown; findings below may reference images not displayed]

FINDINGS: There is no free intraperitoneal air.  Bowel gas pattern
is unremarkable.  No unexpected calcification.
IMPRESSION: No acute finding.

## 2010-12-04 IMAGING — CR DG CHEST 2V
2 series · 2 of 2 positions shown · non-contrast
Comparison: 03/10/2009.  06/16/2009.

CLINICAL DATA: History of heart failure.  Coughing.  Congestion.

CHEST - 2 VIEW

[w chest pa]
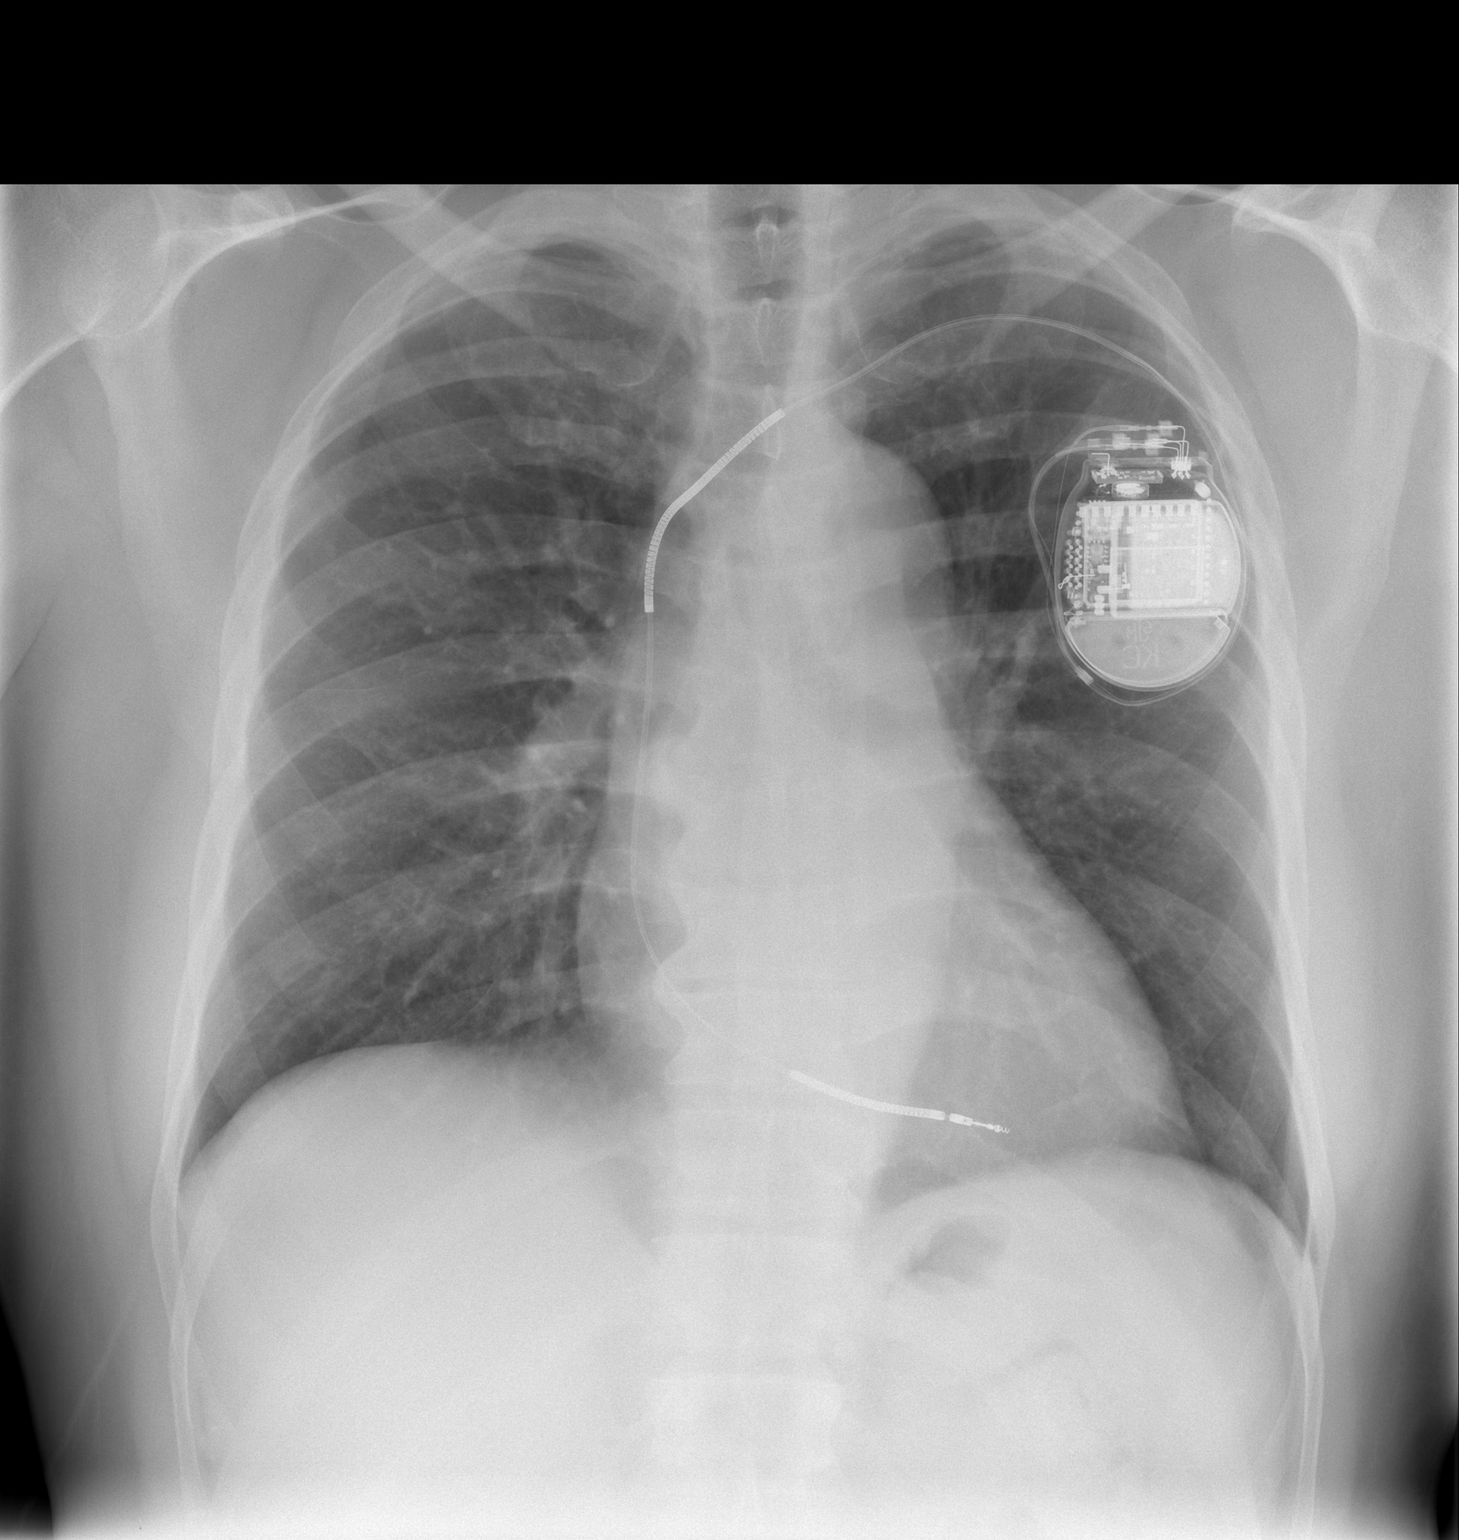

[w chest lat]
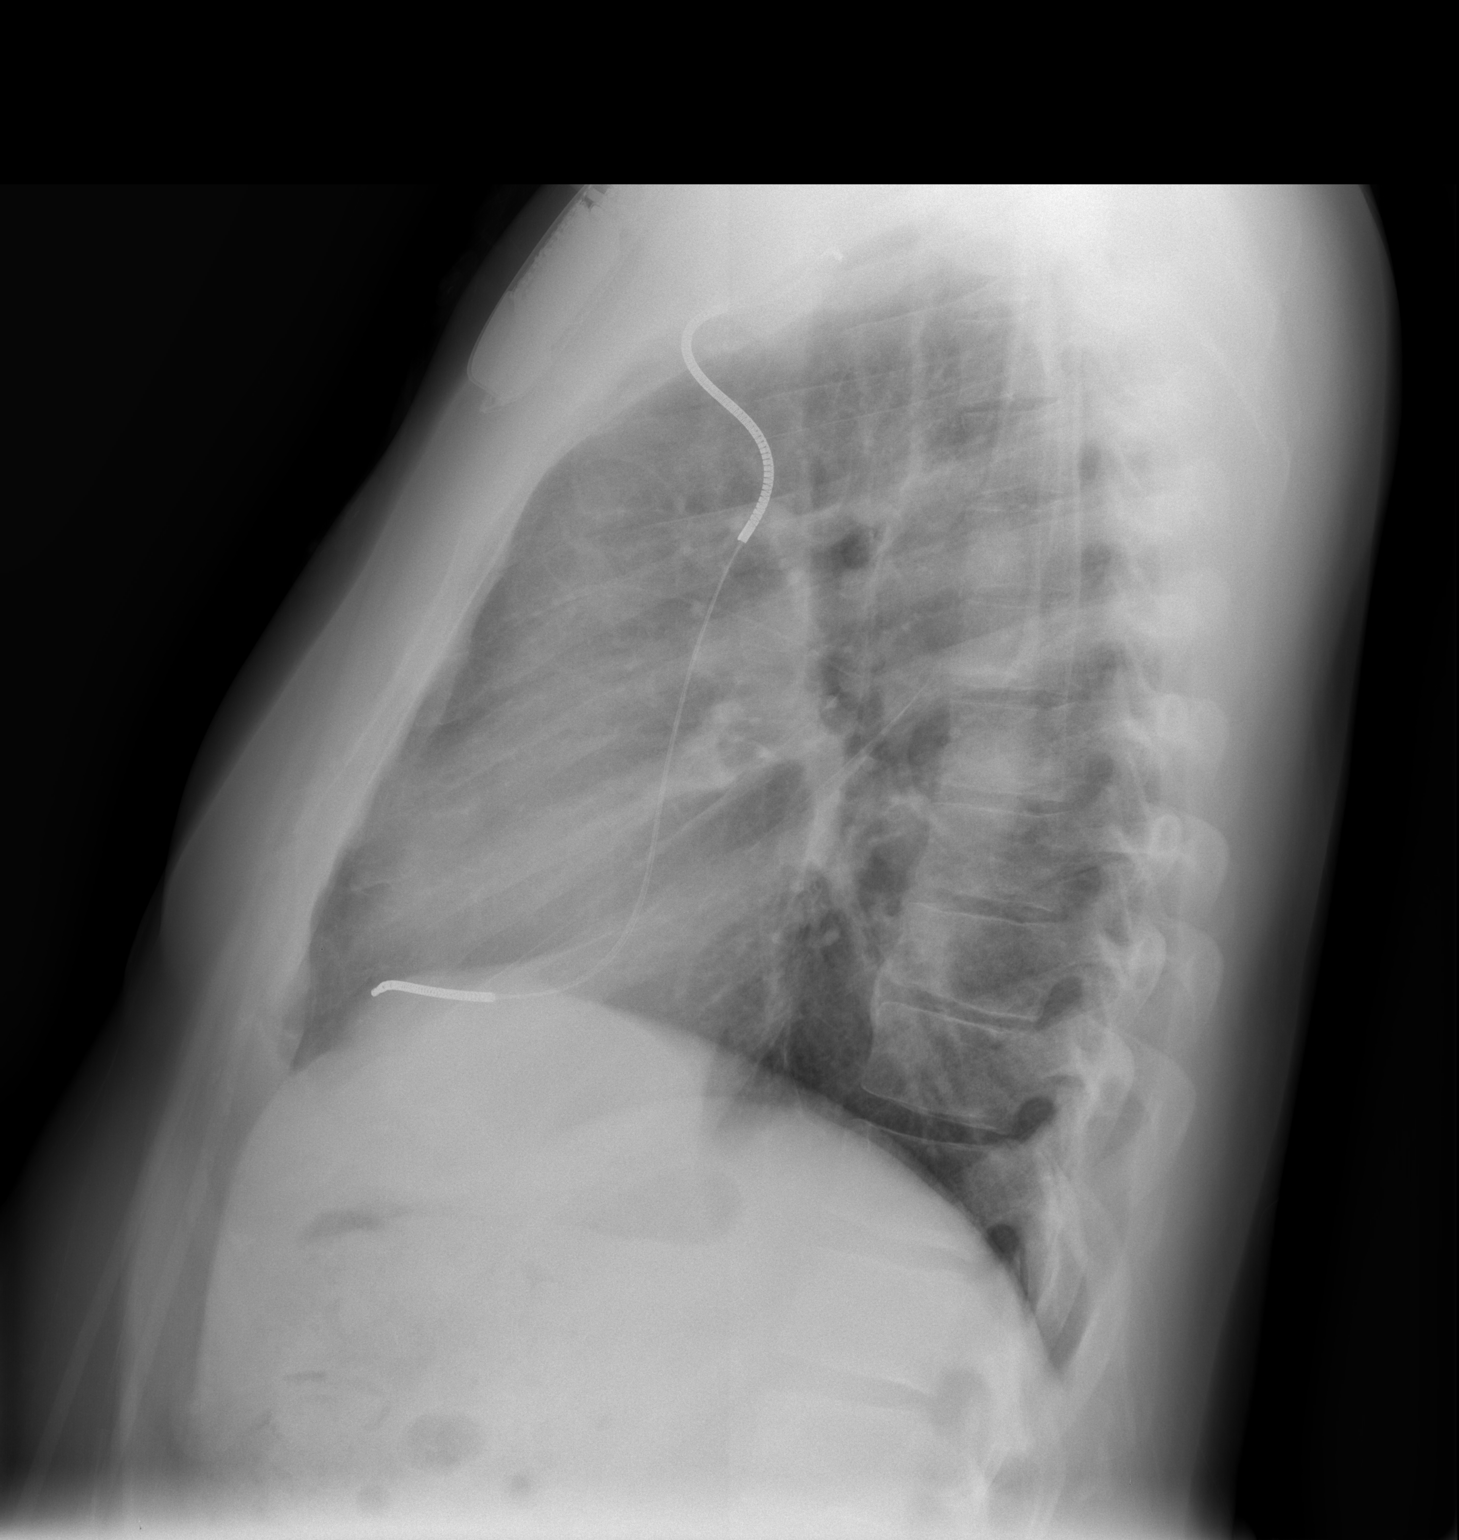

[2 of 2 positions shown; findings below may reference images not displayed]

FINDINGS: Single lead AICD is in place with controller device on
the left.  No pneumothorax is evident. The cardiac silhouette is
minimally enlarged. No pulmonary edema, pneumonia, or pleural
effusion is seen.  Osteophytes are present in the spine.
IMPRESSION: The cardiac silhouette is minimally enlarged. AICD in place.  No
pulmonary edema or pleural effusion is evident.

## 2010-12-07 ENCOUNTER — Encounter: Payer: Self-pay | Admitting: Emergency Medicine

## 2010-12-07 ENCOUNTER — Inpatient Hospital Stay (HOSPITAL_COMMUNITY)
Admission: EM | Admit: 2010-12-07 | Discharge: 2010-12-13 | DRG: 292 | Disposition: A | Discharge status: Home / Self Care | Payer: PRIVATE HEALTH INSURANCE | Attending: Cardiology | Admitting: Cardiology

## 2010-12-07 ENCOUNTER — Emergency Department (HOSPITAL_COMMUNITY): Payer: PRIVATE HEALTH INSURANCE

## 2010-12-07 DIAGNOSIS — I509 Heart failure, unspecified: Secondary | ICD-10-CM | POA: Diagnosis present

## 2010-12-07 DIAGNOSIS — D869 Sarcoidosis, unspecified: Secondary | ICD-10-CM | POA: Diagnosis present

## 2010-12-07 DIAGNOSIS — I5023 Acute on chronic systolic (congestive) heart failure: Principal | ICD-10-CM | POA: Diagnosis present

## 2010-12-07 DIAGNOSIS — I4891 Unspecified atrial fibrillation: Secondary | ICD-10-CM | POA: Diagnosis present

## 2010-12-07 DIAGNOSIS — Z95810 Presence of automatic (implantable) cardiac defibrillator: Secondary | ICD-10-CM

## 2010-12-07 DIAGNOSIS — Z7901 Long term (current) use of anticoagulants: Secondary | ICD-10-CM

## 2010-12-07 DIAGNOSIS — K219 Gastro-esophageal reflux disease without esophagitis: Secondary | ICD-10-CM | POA: Diagnosis present

## 2010-12-07 DIAGNOSIS — I472 Ventricular tachycardia, unspecified: Secondary | ICD-10-CM | POA: Diagnosis present

## 2010-12-07 DIAGNOSIS — E739 Lactose intolerance, unspecified: Secondary | ICD-10-CM | POA: Diagnosis present

## 2010-12-07 DIAGNOSIS — I129 Hypertensive chronic kidney disease with stage 1 through stage 4 chronic kidney disease, or unspecified chronic kidney disease: Secondary | ICD-10-CM | POA: Diagnosis present

## 2010-12-07 DIAGNOSIS — I4729 Other ventricular tachycardia: Secondary | ICD-10-CM | POA: Diagnosis present

## 2010-12-07 DIAGNOSIS — Z8249 Family history of ischemic heart disease and other diseases of the circulatory system: Secondary | ICD-10-CM

## 2010-12-07 DIAGNOSIS — I5021 Acute systolic (congestive) heart failure: Secondary | ICD-10-CM | POA: Diagnosis present

## 2010-12-07 DIAGNOSIS — I428 Other cardiomyopathies: Secondary | ICD-10-CM | POA: Diagnosis present

## 2010-12-07 DIAGNOSIS — R0600 Dyspnea, unspecified: Secondary | ICD-10-CM

## 2010-12-07 DIAGNOSIS — N189 Chronic kidney disease, unspecified: Secondary | ICD-10-CM | POA: Diagnosis present

## 2010-12-07 HISTORY — DX: Heart failure, unspecified: I50.9

## 2010-12-07 HISTORY — DX: Essential (primary) hypertension: I10

## 2010-12-07 HISTORY — DX: Sarcoidosis, unspecified: D86.9

## 2010-12-07 LAB — PROTIME-INR: INR: 1.3 (ref 0.00–1.49)

## 2010-12-07 LAB — CBC
Hemoglobin: 13.5 g/dL (ref 13.0–17.0)
MCH: 30.7 pg (ref 26.0–34.0)
RBC: 4.4 MIL/uL (ref 4.22–5.81)

## 2010-12-07 LAB — BASIC METABOLIC PANEL
CO2: 22 mEq/L (ref 19–32)
Calcium: 9.7 mg/dL (ref 8.4–10.5)
Glucose, Bld: 108 mg/dL — ABNORMAL HIGH (ref 70–99)
Sodium: 140 mEq/L (ref 135–145)

## 2010-12-07 LAB — POCT I-STAT TROPONIN I: Troponin i, poc: 0.03 ng/mL (ref 0.00–0.08)

## 2010-12-07 MED ORDER — FUROSEMIDE 10 MG/ML IJ SOLN
80.0000 mg | Freq: Once | INTRAMUSCULAR | Status: AC
  Administered 2010-12-07: 80 mg via INTRAVENOUS
  Filled 2010-12-07: qty 8

## 2010-12-07 NOTE — ED Provider Notes (Signed)
History     CSN: [id number] Arrival date & time: 12/07/2010  7:47 PM   First MD Initiated Contact with Patient 12/07/10 2053      Chief Complaint  Patient presents with  . Chest Pain    (Consider location/radiation/quality/duration/timing/severity/associated sxs/prior treatment) Patient is a 60 y.o. male presenting with shortness of breath. The history is provided by the patient.  Shortness of Breath  The current episode started yesterday. The onset was sudden. The problem occurs continuously. The problem has been unchanged. The problem is severe. The symptoms are relieved by nothing. The symptoms are aggravated by activity (lying flat). Associated symptoms include chest pressure, cough and shortness of breath. Pertinent negatives include no chest pain and no fever. The cough is non-productive. He has had prior hospitalizations. Past medical history comments: CHF. There were no sick contacts. He has received no recent medical care.    Past Medical History  Diagnosis Date  . Hypertension   . CHF (congestive heart failure)   . Sarcoidosis   . Cardiomyopathy     History reviewed. No pertinent past surgical history.  No family history on file.  History  Substance Use Topics  . Smoking status: Never Smoker   . Smokeless tobacco: Not on file  . Alcohol Use: No      Review of Systems  Unable to perform ROS Constitutional: Negative for fever and chills.  Respiratory: Positive for cough and shortness of breath.   Cardiovascular: Negative for chest pain, palpitations and leg swelling.  Gastrointestinal: Negative for nausea, vomiting, abdominal pain and abdominal distention.  Musculoskeletal: Negative for myalgias and arthralgias.  Skin: Negative for color change and rash.  Neurological: Negative for light-headedness and headaches.  All other systems reviewed and are negative.    Allergies  Indomethacin  Home Medications   Current Outpatient Rx  Name Route Sig Dispense  Refill  . ALLOPURINOL 100 MG PO TABS Oral Take 100 mg by mouth daily.      Marland Kitchen CARVEDILOL 25 MG PO TABS Oral Take 12.5 mg by mouth 2 (two) times daily with a meal.      . DIGOXIN 0.25 MG PO TABS Oral Take 250 mcg by mouth daily.      Marland Kitchen PANTOPRAZOLE SODIUM 40 MG PO TBEC Oral Take 40 mg by mouth daily.      Marland Kitchen ROSUVASTATIN CALCIUM 5 MG PO TABS Oral Take 5 mg by mouth daily.      . SUCRALFATE 1 G PO TABS Oral Take 1 g by mouth 4 (four) times daily.      . WARFARIN SODIUM 10 MG PO TABS Oral Take 12 mg by mouth daily. Pt cuts tab into 4 pieces to make 12 mg       BP 139/91  Pulse 77  Temp(Src) 97.4 F (36.3 C) (Oral)  Resp 20  SpO2 98%  Physical Exam  Nursing note and vitals reviewed. Constitutional: He is oriented to person, place, and time. He appears well-developed and well-nourished.  HENT:  Head: Normocephalic and atraumatic.  Eyes: EOM are normal. Pupils are equal, round, and reactive to light.  Cardiovascular: Normal rate and regular rhythm.  Exam reveals gallop and S3.   No murmur heard. Pulmonary/Chest: Effort normal. No respiratory distress. He has rhonchi (mild, bases).  Abdominal: Soft. He exhibits no fluid wave and no ascites. There is no tenderness.  Musculoskeletal: He exhibits no edema.  Neurological: He is alert and oriented to person, place, and time.  Skin: Skin is warm and  dry.  Psychiatric: He has a normal mood and affect.    ED Course  Procedures (including critical care time)  Labs Reviewed  BASIC METABOLIC PANEL - Abnormal; Notable for the following:    Glucose, Bld 108 (*)    BUN 27 (*)    Creatinine, Ser 1.72 (*)    GFR calc non Af Amer 41 (*)    GFR calc Af Amer 48 (*)    All other components within normal limits  PRO B NATRIURETIC PEPTIDE - Abnormal; Notable for the following:    BNP, POC 6737.0 (*)    All other components within normal limits  PROTIME-INR - Abnormal; Notable for the following:    Prothrombin Time 16.4 (*)    All other components  within normal limits  CBC  POCT I-STAT TROPONIN I  I-STAT TROPONIN I   Dg Chest 2 View  12/07/2010  *RADIOLOGY REPORT*  Clinical Data: Chest pain with weakness tonight.  CHEST - 2 VIEW  Comparison: 10/17/2010.  Findings: Left subclavian AICD is unchanged.  There is stable cardiomegaly.  The mediastinal contours and pulmonary vascularity are unchanged.  There is no pleural effusion.  IMPRESSION: Stable cardiomegaly and AICD position.  No acute cardiopulmonary process.  Original Report Authenticated By: Gerrianne Scale, M.D.    Date: 12/07/2010  Rate: 89  Rhythm: atrial fibrillation  QRS Axis: left  Intervals: normal  ST/T Wave abnormalities: nonspecific T wave changes  Conduction Disutrbances:none  Narrative Interpretation: a fib, non specific T wave changes, unchanged from prior  Old EKG Reviewed: uncanged    1. CHF exacerbation   2. Dyspnea       MDM  This is a 60 year old male with a history of CAD, as well as CHF who presents with 2 days worth of progressive shortness of breath, and chest heaviness, which she says is typical of when he gets fluid overloaded. This is accompanied by worsening dyspnea on exertion, inability to lay flat and he says that over the past 2 months since he was last discharged, he has gained 20 pounds gradually. Says that his abdomen is larger than when he is at his dry weight, but denies any abdominal pain, or distention. Exam is remarkable for mild crackles on exam, soft S3 noted, no significant edema. Will check labs, chest x-ray, EKG and treat symptoms with Lasix to evaluate for improvement.  Patient began to diurese, but has not noticed much improvement in his symptoms. Labs are remarkable for a BNP of 6700; the patient has had elevations this high in the past, however at discharge after his last hospitalization, his BNP was noted to be less than 1000. Discussed the patient with his cardiologist, who will admit the patient to the hospital for further  treatment. Patient expresses understanding of this plan      Theotis Burrow, MD 12/07/10 [phone]

## 2010-12-07 NOTE — Progress Notes (Addendum)
61 year old male with a history of congestive heart failure states he has gained about 20 pounds over the last several weeks and has noted some abdominal distention. Yesterday started having progressive dyspnea. Denies any chest pain. On exam, he has bibasilar rales worse on the right, 1+ presacral edema and trace pretibial edema. His BNP is elevated. He will need to be admitted to be diuresed.

## 2010-12-07 NOTE — ED Notes (Signed)
PT. REPORTS SUBSTERNAL CHEST PAIN WITH SOB AND PRODUCTIVE COUGH , PAIN RADIATING TO NECK AND RIGHT SHOULDER , DENIES NAUSEA OR VOMITTING , NO DIAPHORESIS.  EXERTIONAL DYSPNEA

## 2010-12-07 NOTE — ED Notes (Signed)
Pt noted to have a 9 beat run of v-tach.  Pt unaware, sitting up talking on the phone.  EDP made aware.  Copy of rhythm made and placed in pts chart.

## 2010-12-08 ENCOUNTER — Other Ambulatory Visit: Payer: Self-pay

## 2010-12-08 ENCOUNTER — Encounter (HOSPITAL_COMMUNITY): Payer: Self-pay | Admitting: *Deleted

## 2010-12-08 DIAGNOSIS — I5021 Acute systolic (congestive) heart failure: Secondary | ICD-10-CM | POA: Diagnosis present

## 2010-12-08 LAB — COMPREHENSIVE METABOLIC PANEL
AST: 33 U/L (ref 0–37)
BUN: 27 mg/dL — ABNORMAL HIGH (ref 6–23)
CO2: 25 mEq/L (ref 19–32)
Calcium: 10.1 mg/dL (ref 8.4–10.5)
Chloride: 108 mEq/L (ref 96–112)
Creatinine, Ser: 1.72 mg/dL — ABNORMAL HIGH (ref 0.50–1.35)
GFR calc Af Amer: 48 mL/min — ABNORMAL LOW (ref >90)
GFR calc non Af Amer: 41 mL/min — ABNORMAL LOW (ref >90)
Total Bilirubin: 0.6 mg/dL (ref 0.3–1.2)

## 2010-12-08 LAB — DIFFERENTIAL
Basophils Absolute: 0 10*3/uL (ref 0.0–0.1)
Eosinophils Relative: 7 % — ABNORMAL HIGH (ref 0–5)
Lymphocytes Relative: 50 % — ABNORMAL HIGH (ref 12–46)
Monocytes Absolute: 0.7 10*3/uL (ref 0.1–1.0)
Monocytes Relative: 11 % (ref 3–12)

## 2010-12-08 LAB — CARDIAC PANEL(CRET KIN+CKTOT+MB+TROPI)
CK, MB: 4.1 ng/mL — ABNORMAL HIGH (ref 0.3–4.0)
Total CK: 170 U/L (ref 7–232)
Troponin I: <0.3 ng/mL (ref <0.30)

## 2010-12-08 LAB — CBC
HCT: 40.4 % (ref 39.0–52.0)
Hemoglobin: 13.8 g/dL (ref 13.0–17.0)
MCHC: 34.2 g/dL (ref 30.0–36.0)
MCV: 90.8 fL (ref 78.0–100.0)
RDW: 13.2 % (ref 11.5–15.5)
WBC: 6.6 10*3/uL (ref 4.0–10.5)

## 2010-12-08 LAB — APTT: aPTT: 35 seconds (ref 24–37)

## 2010-12-08 LAB — PROTIME-INR
INR: 1.26 (ref 0.00–1.49)
Prothrombin Time: 16.1 seconds — ABNORMAL HIGH (ref 11.6–15.2)

## 2010-12-08 MED ORDER — BIOTENE DRY MOUTH MT LIQD
15.0000 mL | Freq: Two times a day (BID) | OROMUCOSAL | Status: DC
  Administered 2010-12-08 – 2010-12-13 (×10): 15 mL via OROMUCOSAL

## 2010-12-08 MED ORDER — SODIUM CHLORIDE 0.9 % IJ SOLN
3.0000 mL | Freq: Two times a day (BID) | INTRAMUSCULAR | Status: DC
  Administered 2010-12-08 – 2010-12-13 (×10): 3 mL via INTRAVENOUS

## 2010-12-08 MED ORDER — ACETAMINOPHEN 325 MG PO TABS
650.0000 mg | ORAL_TABLET | ORAL | Status: DC | PRN
  Filled 2010-12-08: qty 2

## 2010-12-08 MED ORDER — MORPHINE SULFATE 2 MG/ML IJ SOLN
2.0000 mg | INTRAMUSCULAR | Status: DC | PRN
  Administered 2010-12-08 – 2010-12-13 (×5): 2 mg via INTRAVENOUS
  Filled 2010-12-08 (×5): qty 1

## 2010-12-08 MED ORDER — DIGOXIN 250 MCG PO TABS
250.0000 ug | ORAL_TABLET | Freq: Every day | ORAL | Status: DC
  Administered 2010-12-08 – 2010-12-13 (×6): 250 ug via ORAL
  Filled 2010-12-08 (×6): qty 1

## 2010-12-08 MED ORDER — PANTOPRAZOLE SODIUM 40 MG PO TBEC
40.0000 mg | DELAYED_RELEASE_TABLET | Freq: Every day | ORAL | Status: DC
  Administered 2010-12-08 – 2010-12-13 (×6): 40 mg via ORAL
  Filled 2010-12-08 (×6): qty 1

## 2010-12-08 MED ORDER — SUCRALFATE 1 G PO TABS
1.0000 g | ORAL_TABLET | Freq: Four times a day (QID) | ORAL | Status: DC
  Administered 2010-12-08 – 2010-12-13 (×23): 1 g via ORAL
  Filled 2010-12-08 (×24): qty 1

## 2010-12-08 MED ORDER — WARFARIN SODIUM 6 MG PO TABS
12.0000 mg | ORAL_TABLET | Freq: Once | ORAL | Status: AC
  Administered 2010-12-08: 12 mg via ORAL
  Filled 2010-12-08: qty 2

## 2010-12-08 MED ORDER — AMIODARONE HCL 200 MG PO TABS
200.0000 mg | ORAL_TABLET | Freq: Every day | ORAL | Status: DC
  Administered 2010-12-08 – 2010-12-13 (×6): 200 mg via ORAL
  Filled 2010-12-08 (×6): qty 1

## 2010-12-08 MED ORDER — FUROSEMIDE 10 MG/ML IJ SOLN
80.0000 mg | Freq: Two times a day (BID) | INTRAMUSCULAR | Status: DC
  Administered 2010-12-08 – 2010-12-12 (×10): 80 mg via INTRAVENOUS
  Filled 2010-12-08 (×13): qty 8

## 2010-12-08 MED ORDER — SODIUM CHLORIDE 0.9 % IJ SOLN
3.0000 mL | INTRAMUSCULAR | Status: DC | PRN
  Administered 2010-12-12: 3 mL via INTRAVENOUS

## 2010-12-08 MED ORDER — ALLOPURINOL 100 MG PO TABS
100.0000 mg | ORAL_TABLET | Freq: Every day | ORAL | Status: DC
  Administered 2010-12-08 – 2010-12-13 (×6): 100 mg via ORAL
  Filled 2010-12-08 (×6): qty 1

## 2010-12-08 MED ORDER — ROSUVASTATIN CALCIUM 5 MG PO TABS
5.0000 mg | ORAL_TABLET | Freq: Every day | ORAL | Status: DC
  Administered 2010-12-08 – 2010-12-13 (×6): 5 mg via ORAL
  Filled 2010-12-08 (×6): qty 1

## 2010-12-08 MED ORDER — ONDANSETRON HCL 4 MG/2ML IJ SOLN
4.0000 mg | Freq: Four times a day (QID) | INTRAMUSCULAR | Status: DC | PRN
  Administered 2010-12-11 – 2010-12-13 (×2): 4 mg via INTRAVENOUS
  Filled 2010-12-08 (×2): qty 2

## 2010-12-08 MED ORDER — POTASSIUM CHLORIDE CRYS ER 20 MEQ PO TBCR
20.0000 meq | EXTENDED_RELEASE_TABLET | Freq: Two times a day (BID) | ORAL | Status: DC
  Administered 2010-12-08 – 2010-12-09 (×4): 20 meq via ORAL
  Filled 2010-12-08 (×5): qty 1

## 2010-12-08 MED ORDER — SODIUM CHLORIDE 0.9 % IV SOLN: 250.0000 mL | INTRAVENOUS | Status: DC | PRN

## 2010-12-08 MED ORDER — CARVEDILOL 3.125 MG PO TABS
3.1250 mg | ORAL_TABLET | Freq: Two times a day (BID) | ORAL | Status: DC
  Administered 2010-12-08 – 2010-12-13 (×12): 3.125 mg via ORAL
  Filled 2010-12-08 (×14): qty 1

## 2010-12-08 NOTE — Progress Notes (Signed)
Patient arrived to unit via stretcher from emergency department.  Patient alert and oriented x3, placed on telemetry.  Patient oriented to room.  Safety video to be viewed tomorrow per patient request.  Dr. [name] notified of patient's arrival.  MD aware of patient's vitals and midsternal pain of 5/10 described as "sharp."  Patient states this is the same pain he had while in the emergency department.  Dr. Marni Griffon aware and has no new orders at this time.  MD to put orders in for patient.  Advised that patient may drink water until orders are placed.  Skin intact-scar to left chest, BLE dry/flaky skin, scabs to right shin.  Continent of bowel and bladder, last bowel movement 12/07/10.  Will continue to monitor.

## 2010-12-08 NOTE — H&P (Signed)
[name] is an 60 y.o. male.   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 hypertension chronic atrial fibrillation history of nonsustained VT glucose intolerance GERD sarcoidosis chronic kidney disease came to the ER complaining of progressive increasing shortness of breath so seated with abdominal swelling and weight gain of approximately 20 pounds in last few weeks patient has history of PND orthopnea and minimal leg swelling denies any anginal chest pain nausea vomiting diaphoresis states feels occasional palpitation denies any lightheadedness or syncopal episode denies any ICD discharges patient had multiple admissions for recurrent decompensated systolic heart failure secondary to noncompliance to medication patient states he missed a day or 2 off Coumadin dose. Patient denies any dietary indiscretion or salty food intake states he has been taking his medications and in fact increased the dose of Lasix without improvement in his breathing so decided to come to the ED patient denies any cough fever chills denies any urinary complaints patient was noted to have BNP of her above 6000 her received IV Lasix in the ED with improvement in her symptoms  Past Medical History  Diagnosis Date  . Hypertension   . CHF (congestive heart failure)   . Sarcoidosis   . Cardiomyopathy     Past Surgical History  Procedure Date  . Back surgery 1987    Ruptured disk repair    History reviewed. No pertinent family history. Social History:  reports that he has never smoked. He does not have any smokeless tobacco history on file. He reports that he does not drink alcohol or use illicit drugs.  Allergies: No Active Allergies  Medications  Prior to Admission  Medication Dose Route Frequency Provider Last Rate Last Dose  . 0.9 %  sodium chloride infusion  250 mL Intravenous PRN Robynn Pane, MD      . acetaminophen (TYLENOL) tablet 650 mg  650 mg Oral Q4H PRN Robynn Pane, MD      . allopurinol (ZYLOPRIM) tablet 100 mg  100 mg Oral Daily Robynn Pane, MD      . antiseptic oral rinse (BIOTENE) solution 15 mL  15 mL Mouth Rinse BID Robynn Pane, MD   15 mL at 12/08/10 0327  . carvedilol (COREG) tablet 3.125 mg  3.125 mg Oral Q12H Robynn Pane, MD   3.125 mg at 12/08/10 0226  . digoxin (LANOXIN) tablet 250 mcg  250 mcg Oral Daily Robynn Pane, MD      . furosemide (LASIX) injection 80 mg  80 mg Intravenous Once Theotis Burrow, MD   80 mg at 12/07/10 2217  . furosemide (LASIX) injection 80 mg  80 mg Intravenous BID Robynn Pane, MD      . morphine 2 MG/ML injection 2 mg  2 mg Intravenous Q4H PRN Robynn Pane, MD   2 mg at 12/08/10 0323  . ondansetron (ZOFRAN) injection 4 mg  4 mg Intravenous Q6H PRN Robynn Pane, MD      . pantoprazole (PROTONIX) EC tablet 40 mg  40 mg Oral Daily Robynn Pane, MD      . potassium chloride SA (K-DUR,KLOR-CON) CR tablet 20 mEq  20 mEq Oral BID Robynn Pane, MD   20 mEq at 12/08/10 0225  . rosuvastatin (CRESTOR) tablet 5 mg  5 mg Oral q1800 Robynn Pane, MD      . sodium chloride 0.9 % injection 3 mL  3 mL Intravenous Q12H Robynn Pane, MD   3 mL at 12/08/10 0323  . sodium chloride 0.9 % injection 3 mL  3 mL Intravenous PRN Robynn Pane, MD      . sucralfate (CARAFATE) tablet 1 g  1 g Oral QID Robynn Pane, MD      . warfarin (COUMADIN) tablet 12 mg  12 mg Oral ONCE-1800 Janice Coffin, [facility]       No current outpatient prescriptions on file as of 12/08/2010.    Results for orders placed during the hospital encounter of 12/07/10 (from the past 48 hour(s))  CBC     Status: Normal   Collection Time   12/07/10  8:38 PM      Component Value Range  Comment   WBC 5.9  4.0 - 10.5 (K/uL)    RBC 4.40  4.22 - 5.81 (MIL/uL)    Hemoglobin 13.5  13.0 - 17.0 (g/dL)    HCT 16.1  09.6 - 04.5 (%)    MCV 91.6  78.0 - 100.0 (fL)    MCH 30.7  26.0 - 34.0 (pg)    MCHC 33.5  30.0 - 36.0 (g/dL)    RDW 40.9  81.1 - 91.4 (%)    Platelets 188  150 - 400 (K/uL)   BASIC METABOLIC PANEL     Status: Abnormal   Collection Time   12/07/10  8:38 PM      Component Value Range Comment   Sodium 140  135 - 145 (mEq/L)    Potassium 4.0  3.5 - 5.1 (mEq/L)    Chloride 110  96 - 112 (mEq/L)    CO2 22  19 - 32 (mEq/L)    Glucose, Bld 108 (*) 70 - 99 (mg/dL)    BUN 27 (*) 6 - 23 (mg/dL)    Creatinine, Ser 7.82 (*) 0.50 - 1.35 (mg/dL)    Calcium 9.7  8.4 - 10.5 (mg/dL)    GFR calc non Af Amer 41 (*) >90 (mL/min)    GFR calc Af Amer 48 (*) >90 (mL/min)   PRO B NATRIURETIC PEPTIDE     Status: Abnormal   Collection Time   12/07/10  8:40 PM      Component Value Range Comment   BNP, POC 6737.0 (*) 0 - 125 (pg/mL)   POCT I-STAT TROPONIN I     Status: Normal   Collection Time   12/07/10  9:00 PM      Component Value Range Comment   Troponin i, poc 0.03  0.00 - 0.08 (ng/mL)    Comment 3            PROTIME-INR     Status: Abnormal   Collection Time   12/07/10  9:56 PM      Component Value Range Comment   Prothrombin Time 16.4 (*) 11.6 - 15.2 (seconds)    INR 1.30  0.00 - 1.49    CBC     Status: Normal   Collection Time   12/08/10  2:02 AM      Component Value Range Comment   WBC 6.6  4.0 - 10.5 (K/uL)    RBC 4.45  4.22 - 5.81 (MIL/uL)    Hemoglobin 13.8  13.0 - 17.0 (g/dL)    HCT 95.6  21.3 - 08.6 (%)    MCV 90.8  78.0 - 100.0 (  fL)    MCH 31.0  26.0 - 34.0 (pg)    MCHC 34.2  30.0 - 36.0 (g/dL)    RDW 16.1  09.6 - 04.5 (%)    Platelets 193  150 - 400 (K/uL)   DIFFERENTIAL     Status: Abnormal   Collection Time   12/08/10  2:02 AM      Component Value Range Comment   Neutrophils Relative 32 (*) 43 - 77 (%)    Neutro Abs 2.1  1.7 - 7.7 (K/uL)    Lymphocytes  Relative 50 (*) 12 - 46 (%)    Lymphs Abs 3.3  0.7 - 4.0 (K/uL)    Monocytes Relative 11  3 - 12 (%)    Monocytes Absolute 0.7  0.1 - 1.0 (K/uL)    Eosinophils Relative 7 (*) 0 - 5 (%)    Eosinophils Absolute 0.4  0.0 - 0.7 (K/uL)    Basophils Relative 1  0 - 1 (%)    Basophils Absolute 0.0  0.0 - 0.1 (K/uL)   COMPREHENSIVE METABOLIC PANEL     Status: Abnormal   Collection Time   12/08/10  2:02 AM      Component Value Range Comment   Sodium 142  135 - 145 (mEq/L)    Potassium 3.6  3.5 - 5.1 (mEq/L)    Chloride 108  96 - 112 (mEq/L)    CO2 25  19 - 32 (mEq/L)    Glucose, Bld 96  70 - 99 (mg/dL)    BUN 27 (*) 6 - 23 (mg/dL)    Creatinine, Ser 4.09 (*) 0.50 - 1.35 (mg/dL)    Calcium 81.1  8.4 - 10.5 (mg/dL)    Total Protein 7.2  6.0 - 8.3 (g/dL)    Albumin 3.1 (*) 3.5 - 5.2 (g/dL)    AST 33  0 - 37 (U/L)    ALT 28  0 - 53 (U/L)    Alkaline Phosphatase 52  39 - 117 (U/L)    Total Bilirubin 0.6  0.3 - 1.2 (mg/dL)    GFR calc non Af Amer 41 (*) >90 (mL/min)    GFR calc Af Amer 48 (*) >90 (mL/min)   APTT     Status: Normal   Collection Time   12/08/10  2:02 AM      Component Value Range Comment   aPTT 35  24 - 37 (seconds)   PROTIME-INR     Status: Abnormal   Collection Time   12/08/10  2:02 AM      Component Value Range Comment   Prothrombin Time 16.1 (*) 11.6 - 15.2 (seconds)    INR 1.26  0.00 - 1.49    MAGNESIUM     Status: Normal   Collection Time   12/08/10  2:02 AM      Component Value Range Comment   Magnesium 2.0  1.5 - 2.5 (mg/dL)    Dg Chest 2 View  91/04/7827  *RADIOLOGY REPORT*  Clinical Data: Chest pain with weakness tonight.  CHEST - 2 VIEW  Comparison: 10/17/2010.  Findings: Left subclavian AICD is unchanged.  There is stable cardiomegaly.  The mediastinal contours and pulmonary vascularity are unchanged.  There is no pleural effusion.  IMPRESSION: Stable cardiomegaly and AICD position.  No acute cardiopulmonary process.  Original Report Authenticated By: Gerrianne Scale, M.D.    Review of Systems  Constitutional: Negative for fever and chills.  Eyes: Negative for blurred vision.  Respiratory: Positive for cough and  shortness of breath. Negative for hemoptysis and sputum production.   Cardiovascular: Positive for palpitations, orthopnea, leg swelling and PND. Negative for chest pain.  Gastrointestinal: Positive for heartburn.  Genitourinary: Negative for dysuria and urgency.  Musculoskeletal: Negative for myalgias.  Neurological: Negative for dizziness and headaches.    Blood pressure 126/90, pulse 94, temperature 97.3 F (36.3 C), temperature source Oral, resp. rate 18, height 6\' 3"  (1.905 m), weight 104.917 kg (231 lb 4.8 oz), SpO2 94.00%. Physical Exam  Constitutional: He appears well-developed. No distress.  HENT:  Head: Normocephalic.  Eyes: Conjunctivae are normal.  Neck: Neck supple. JVD present. No thyromegaly present.  Cardiovascular:       Irregularly irregular S1 and S2 is normal there is soft systolic murmur and S3 gallop present  Respiratory: He has no wheezes.       By basilar Rales present  GI: Soft. Bowel sounds are normal. He exhibits distension. There is no tenderness. There is no rebound.  Musculoskeletal:       No clubbing cyanosis trace edema noted  Lymphadenopathy:    He has no cervical adenopathy.     Assessment/Plan Decompensated systolic heart failure rule out ischemia Nonischemic dilated cardiomyopathy status post by V. ICD in the past Hypertension Chronic atrial fibrillation History of nonsustained VT asymptomatic Glucose intolerance Chronic kidney disease History of sarcoidosis GERD Plan Rule out MI protocol Restart home medications Change Lasix to IV Hold ACE and ARB in view of her chronic kidney disease Check labs in a.m.  Helmer Dull N 12/08/2010, 9:46 AM

## 2010-12-08 NOTE — Progress Notes (Signed)
Patient complaining of new onset of severe, throbbing headache rated 10/10, continues to complain of sharp chest pain 5 out of 10.  Dr. Basilio Cairo notified of patient pain and patient refusal to take tylenol for pain; patient states tylenol "tears his stomach up."  New order received for IV morphine.  Will continue to monitor.

## 2010-12-08 NOTE — ED Provider Notes (Signed)
I saw and evaluated the patient, reviewed the resident's note and I agree with the findings and plan. I reviewed the resident's reading of the ECG and agree with the exception that there is a borderline prolonged QT interval, and T wave inversions in the anterolateral leads are slightly more pronounced although this may be due to lead placement.  Dione Booze, MD 12/08/10 1040

## 2010-12-08 NOTE — Progress Notes (Signed)
ANTICOAGULATION CONSULT NOTE - Initial Consult  Pharmacy Consult for coumadin  Indication: atrial fibrillation  No Active Allergies  Patient Measurements: Height: 6\' 3"  (190.5 cm) Weight: 231 lb 4.8 oz (104.917 kg) (scale a) IBW/kg (Calculated) : 84.5  Adjusted Body Weight:    Vital Signs: Temp: 97.4 F (36.3 C) (12/06 0037) Temp src: Oral (12/05 2002) BP: 122/96 mmHg (12/06 0037) Pulse Rate: 99  (12/06 0037)  Labs:  Basename 12/07/10 2156 12/07/10 2038  HGB -- 13.5  HCT -- 40.3  PLT -- 188  APTT -- --  LABPROT 16.4* --  INR 1.30 --  HEPARINUNFRC -- --  CREATININE -- 1.72*  CKTOTAL -- --  CKMB -- --  TROPONINI -- --   Estimated Creatinine Clearance: 59.9 ml/min (by C-G formula based on Cr of 1.72).  Medical History: Past Medical History  Diagnosis Date  . Hypertension   . CHF (congestive heart failure)   . Sarcoidosis   . Cardiomyopathy     Medications:  Prescriptions prior to admission  Medication Sig Dispense Refill  . allopurinol (ZYLOPRIM) 100 MG tablet Take 100 mg by mouth daily.        . carvedilol (COREG) 25 MG tablet Take 12.5 mg by mouth 2 (two) times daily with a meal.        . digoxin (LANOXIN) 0.25 MG tablet Take 250 mcg by mouth daily.        . pantoprazole (PROTONIX) 40 MG tablet Take 40 mg by mouth daily.        . rosuvastatin (CRESTOR) 5 MG tablet Take 5 mg by mouth daily.        . sucralfate (CARAFATE) 1 G tablet Take 1 g by mouth 4 (four) times daily.        Marland Kitchen warfarin (COUMADIN) 10 MG tablet Take 12 mg by mouth daily. Pt cuts tab into 4 pieces to make 12 mg         Assessment: Coumadin for hx afib with inr of 1.3  Goal of Therapy:  INR 2-3   Plan:  Coumadin 12mg  at 1800. Daily INR for f/u and subsequent dosing   Janice Coffin 12/08/2010,2:05 AM

## 2010-12-08 NOTE — Progress Notes (Signed)
Pt. Had another 9 beat run of v-tach. Pt. Laying in bed awake, asymptomatic.  Strip placed in chart.  Will continue to monitor patient.

## 2010-12-09 LAB — CBC
HCT: 43.6 % (ref 39.0–52.0)
Hemoglobin: 15 g/dL (ref 13.0–17.0)
MCHC: 34.4 g/dL (ref 30.0–36.0)
MCV: 89.9 fL (ref 78.0–100.0)
RDW: 13.1 % (ref 11.5–15.5)
WBC: 4.8 10*3/uL (ref 4.0–10.5)

## 2010-12-09 LAB — BASIC METABOLIC PANEL
BUN: 31 mg/dL — ABNORMAL HIGH (ref 6–23)
CO2: 28 mEq/L (ref 19–32)
Calcium: 10.3 mg/dL (ref 8.4–10.5)
GFR calc non Af Amer: 36 mL/min — ABNORMAL LOW (ref >90)
Glucose, Bld: 139 mg/dL — ABNORMAL HIGH (ref 70–99)

## 2010-12-09 LAB — CARDIAC PANEL(CRET KIN+CKTOT+MB+TROPI)
CK, MB: 3.5 ng/mL (ref 0.3–4.0)
Total CK: 135 U/L (ref 7–232)

## 2010-12-09 LAB — DIGOXIN LEVEL: Digoxin Level: 0.6 ng/mL — ABNORMAL LOW (ref 0.8–2.0)

## 2010-12-09 LAB — HEPARIN LEVEL (UNFRACTIONATED): Heparin Unfractionated: 0.59 IU/mL (ref 0.30–0.70)

## 2010-12-09 MED ORDER — POTASSIUM CHLORIDE CRYS ER 20 MEQ PO TBCR
40.0000 meq | EXTENDED_RELEASE_TABLET | Freq: Once | ORAL | Status: AC
  Administered 2010-12-09: 40 meq via ORAL
  Filled 2010-12-09: qty 2

## 2010-12-09 MED ORDER — POTASSIUM CHLORIDE CRYS ER 20 MEQ PO TBCR: 40.0000 meq | EXTENDED_RELEASE_TABLET | Freq: Once | ORAL | Status: DC

## 2010-12-09 MED ORDER — HEPARIN BOLUS VIA INFUSION
4000.0000 [IU] | Freq: Once | INTRAVENOUS | Status: AC
  Administered 2010-12-09: 4000 [IU] via INTRAVENOUS

## 2010-12-09 MED ORDER — WARFARIN SODIUM 6 MG PO TABS
12.0000 mg | ORAL_TABLET | Freq: Once | ORAL | Status: AC
  Administered 2010-12-09: 12 mg via ORAL
  Filled 2010-12-09 (×2): qty 2

## 2010-12-09 MED ORDER — HEPARIN SOD (PORCINE) IN D5W 100 UNIT/ML IV SOLN
1250.0000 [IU]/h | INTRAVENOUS | Status: DC
  Administered 2010-12-09: 1500 [IU]/h via INTRAVENOUS
  Administered 2010-12-10: 1250 [IU]/h via INTRAVENOUS
  Administered 2010-12-10: 1500 [IU]/h via INTRAVENOUS
  Filled 2010-12-09 (×4): qty 250

## 2010-12-09 MED ORDER — POTASSIUM CHLORIDE CRYS ER 20 MEQ PO TBCR
20.0000 meq | EXTENDED_RELEASE_TABLET | Freq: Three times a day (TID) | ORAL | Status: DC
  Administered 2010-12-09 – 2010-12-10 (×3): 20 meq via ORAL
  Filled 2010-12-09 (×4): qty 1

## 2010-12-09 NOTE — Progress Notes (Signed)
Followed up with patient today in regards to holistic alternatives for heart failure.  At this time there are no in hospital holistic opportunities.  Spoke in regards to physician follow up.  Will continue to monitor and assist as needed.  Zella Ball 12/09/2010 [phone]

## 2010-12-09 NOTE — Progress Notes (Signed)
   CARE MANAGEMENT NOTE 12/09/2010  Patient:  [name], [name]   Account Number:  [id number]  Date Initiated:  12/08/2010  Documentation initiated by:  Shannan Harper  Subjective/Objective Assessment:   Patient admitted with worsening SOB, edema.  Hx of CHF, rule out MI as well.     Action/Plan:   Patient will discharge to home with [facility] RN.   Anticipated DC Date:  12/11/2010   Anticipated DC Plan:  HOME W HOME HEALTH SERVICES      DC Planning Services  CM consult      [facility] Choice  HOME HEALTH   Choice offered to / List presented to:  C-1 Patient        HH arranged  HH-1 RN  HH-10 DISEASE MANAGEMENT      HH agency  [facility]   Status of service:  In process, will continue to follow Medicare Important Message given?   (If response is "NO", the following Medicare IM given date fields will be blank) Date Medicare IM given:   Date Additional Medicare IM given:    Discharge Disposition:  HOME W HOME HEALTH SERVICES  Per UR Regulation:  Reviewed for med. necessity/level of care/duration of stay  Comments:  Heart Failure Patient:  Met with patient and significant other to discuss d/c planning.  He was reluctant at first with [facility] but significant other assisted with conversation to support the resource.  He agreed.  He also stated he was not pleased with his MD and wanted to change.  He has been wanting to be under Verdis Prime, MD.  Explained would look into this and speak with him tomorrow morning.  Also significant other wanted information on holistic alternatives here at hospital.  Hilda Lias called with [facility] and [facility] referral completed for [facility].  Spoke with patient on edcuation, daily weights, diet, and zones.  Will continue to monitor. 12/09/10 0920 Shannan Harper, RN, BSN late entry  Will meet with patient today for discharge planning related to CHF disease process. UR Completed. 12/08/10 1359 Shannan Harper, RN, BSN

## 2010-12-09 NOTE — Progress Notes (Signed)
ANTICOAGULATION CONSULT NOTE - Initial Consult  Pharmacy Consult for Heparin and Coumadin Indication: chest pain/ACS and atrial fibrillation  Assessment: 60 yo male on coumadin PTA for afib (home dose is 12mg  daily). INR subtherapeutic at 1.22. Watch INR rise due to potential for increase with amiodarone. Now starting IV heparin for bridging and r/o MI protocol.  Goal of Therapy:  INR 2-3 Heparin level 0.3-0.7 units/ml   Plan:  1. Coumadin 12mg  PO x1 today. Check INR in AM. 2. Heparin IV bolus: 4000 units x1. 3. Heparin IV drip rate: 1500 units/hr 4. Check 6 hour heparin level  5. Daily heparin level and CBC.  Ival Bible 12/09/2010,1:47 PM  No Active Allergies  Patient Measurements: Height: 6\' 3"  (190.5 cm) Weight: 223 lb 8.7 oz (101.4 kg) (a scale) IBW/kg (Calculated) : 84.5  Heparin Dosing Weight: 101.4kg  Vital Signs: Temp: 98.2 F (36.8 C) (12/07 0456) BP: 121/90 mmHg (12/07 1123) Pulse Rate: 60  (12/07 1123)  Labs:  Alvira Philips 12/09/10 4098 12/08/10 2355 12/08/10 1559 12/08/10 0950 12/08/10 0202 12/07/10 2156 12/07/10 2038  HGB 15.0 -- -- -- 13.8 -- --  HCT 43.6 -- -- -- 40.4 -- 40.3  PLT 207 -- -- -- 193 -- 188  APTT -- -- -- -- 35 -- --  LABPROT 15.7* -- -- -- 16.1* 16.4* --  INR 1.22 -- -- -- 1.26 1.30 --  HEPARINUNFRC -- -- -- -- -- -- --  CREATININE 1.92* -- -- -- 1.72* -- 1.72*  CKTOTAL -- 135 170 174 -- -- --  CKMB -- 3.5 4.1* 4.3* -- -- --  TROPONINI -- <0.30 <0.30 <0.30 -- -- --   Estimated Creatinine Clearance: 52.8 ml/min (by C-G formula based on Cr of 1.92).  Medical History: Past Medical History  Diagnosis Date  . Hypertension   . CHF (congestive heart failure)   . Sarcoidosis   . Cardiomyopathy     Medications:  Scheduled:    . allopurinol  100 mg Oral Daily  . amiodarone  200 mg Oral Daily  . antiseptic oral rinse  15 mL Mouth Rinse BID  . carvedilol  3.125 mg Oral Q12H  . digoxin  250 mcg Oral Daily  . furosemide  80 mg  Intravenous BID  . pantoprazole  40 mg Oral Daily  . potassium chloride  20 mEq Oral TID  . potassium chloride  40 mEq Oral Once  . rosuvastatin  5 mg Oral q1800  . sodium chloride  3 mL Intravenous Q12H  . sucralfate  1 g Oral QID  . warfarin  12 mg Oral ONCE-1800  . DISCONTD: potassium chloride  20 mEq Oral BID  . DISCONTD: potassium chloride  40 mEq Oral Once

## 2010-12-09 NOTE — Progress Notes (Signed)
MT informed pt had 9 beat v-tach pt is asymptomatic bp 117/85 hr 65 will continue to monitor

## 2010-12-09 NOTE — Progress Notes (Signed)
Subjective:  Patient complains of left-sided upper jaw pain states has seen dentists in the past and was told that will require dental extraction but could not afford it Denies any chest pain or shortness of breath patient denies any fever or chills Heparin was not restarted by mistake Continues to have vague abdominal pain had extensive GI workup which was negative recently had endoscopy also returns nonrevealing The patient requesting for a second opinion with Dr. Verdis Prime   Objective:  Vital Signs in the last 24 hours: Temp:  [96.8 F (36 C)-98.2 F (36.8 C)] 98.2 F (36.8 C) (12/07 0456) Pulse Rate:  [58-80] 60  (12/07 1123) Resp:  [18-19] 18  (12/07 0456) BP: (114-129)/(79-91) 121/90 mmHg (12/07 1123) SpO2:  [93 %-99 %] 93 % (12/07 0456) Weight:  [101.4 kg (223 lb 8.7 oz)] 223 lb 8.7 oz (101.4 kg) (12/07 0456)  Intake/Output from previous day: 12/06 0701 - 12/07 0700 In: 483 [P.O.:480; I.V.:3] Out: 3175 [Urine:3175] Intake/Output from this shift: Total I/O In: 243 [P.O.:240; I.V.:3] Out: -   Physical Exam: General appearance: alert and cooperative Neck: no adenopathy, no carotid bruit, no JVD and supple, symmetrical, trachea midline Lungs: diminished breath sounds bibasilar Heart: Irregularly irregular S1-S2 normal there is soft systolic murmur and S3 gallop present Abdomen: soft, non-tender; bowel sounds normal; no masses,  no organomegaly Extremities: extremities normal, atraumatic, no cyanosis or edema  Lab Results:  [facility] 12/09/10 0638 12/08/10 0202  WBC 4.8 6.6  HGB 15.0 13.8  PLT 207 193    Basename 12/09/10 0638 12/08/10 0202  NA 139 142  K 3.3* 3.6  CL 101 108  CO2 28 25  GLUCOSE 139* 96  BUN 31* 27*  CREATININE 1.92* 1.72*    Basename 12/08/10 2355 12/08/10 1559  TROPONINI <0.30 <0.30   Hepatic Function Panel  Basename 12/08/10 0202  PROT 7.2  ALBUMIN 3.1*  AST 33  ALT 28  ALKPHOS 52  BILITOT 0.6  BILIDIR --  IBILI --   No  results found for this basename: CHOL in the last 72 hours No results found for this basename: PROTIME in the last 72 hours  Imaging: Dg Chest 2 View  12/07/2010  *RADIOLOGY REPORT*  Clinical Data: Chest pain with weakness tonight.  CHEST - 2 VIEW  Comparison: 10/17/2010.  Findings: Left subclavian AICD is unchanged.  There is stable cardiomegaly.  The mediastinal contours and pulmonary vascularity are unchanged.  There is no pleural effusion.  IMPRESSION: Stable cardiomegaly and AICD position.  No acute cardiopulmonary process.  Original Report Authenticated By: Gerrianne Scale, M.D.    Cardiac Studies:  Assessment/Plan:  Resolving decompensated systolic heart failure  Nonischemic dilated cardiomyopathy status post by V. ICD Chronic atrial fibrillation Nonsustained VT asymptomatic Glucose intolerance Chronic kidney disease History of sarcoidosis GERD  left jaw pain her rule out dental caries/abscess Plan Restart heparin and continue Coumadin per pharmacy protocol Consult with dentist Consult with Dr. Verdis Prime for second opinion per patient's request Check labs in a.m.  LOS: 2 days    Ethlyn Alto N 12/09/2010, 12:14 PM

## 2010-12-09 NOTE — Progress Notes (Signed)
ANTICOAGULATION CONSULT NOTE:  Follow-Up Consult  Pharmacy Consult for Heparin Indication: chest pain/ACS and atrial fibrillation  Assessment: [name] with Afib + CP/ACS on heparin gtt.  Heparin level of 0.59 is therapeutic for goal 0.3-0.7.   Plan:  1.  Continue heparin gtt at 1500 units/hr 2.  F/U AM labs   Phillips Climes, PharmD

## 2010-12-10 LAB — PRO B NATRIURETIC PEPTIDE: Pro B Natriuretic peptide (BNP): 2072 pg/mL — ABNORMAL HIGH (ref 0–125)

## 2010-12-10 LAB — CBC
Hemoglobin: 16 g/dL (ref 13.0–17.0)
MCH: 30.7 pg (ref 26.0–34.0)
Platelets: 227 10*3/uL (ref 150–400)
RBC: 5.22 MIL/uL (ref 4.22–5.81)
WBC: 6.1 10*3/uL (ref 4.0–10.5)

## 2010-12-10 LAB — HEPARIN LEVEL (UNFRACTIONATED)
Heparin Unfractionated: 0.72 IU/mL — ABNORMAL HIGH (ref 0.30–0.70)
Heparin Unfractionated: 0.76 IU/mL — ABNORMAL HIGH (ref 0.30–0.70)

## 2010-12-10 LAB — BASIC METABOLIC PANEL
CO2: 24 mEq/L (ref 19–32)
Chloride: 100 mEq/L (ref 96–112)
Creatinine, Ser: 1.9 mg/dL — ABNORMAL HIGH (ref 0.50–1.35)
GFR calc Af Amer: 43 mL/min — ABNORMAL LOW (ref >90)
Potassium: 3.5 mEq/L (ref 3.5–5.1)

## 2010-12-10 LAB — PROTIME-INR: Prothrombin Time: 18.9 seconds — ABNORMAL HIGH (ref 11.6–15.2)

## 2010-12-10 MED ORDER — POTASSIUM CHLORIDE CRYS ER 20 MEQ PO TBCR
20.0000 meq | EXTENDED_RELEASE_TABLET | Freq: Three times a day (TID) | ORAL | Status: DC
  Administered 2010-12-10 – 2010-12-13 (×9): 20 meq via ORAL
  Filled 2010-12-10 (×9): qty 1

## 2010-12-10 MED ORDER — PENICILLIN V POTASSIUM 500 MG PO TABS
500.0000 mg | ORAL_TABLET | Freq: Four times a day (QID) | ORAL | Status: DC
  Administered 2010-12-10 – 2010-12-13 (×14): 500 mg via ORAL
  Filled 2010-12-10 (×16): qty 1

## 2010-12-10 MED ORDER — ZOLPIDEM TARTRATE 5 MG PO TABS
10.0000 mg | ORAL_TABLET | Freq: Every evening | ORAL | Status: DC | PRN
  Administered 2010-12-11 (×2): 10 mg via ORAL
  Filled 2010-12-10 (×2): qty 2

## 2010-12-10 MED ORDER — WARFARIN SODIUM 10 MG PO TABS
12.5000 mg | ORAL_TABLET | Freq: Once | ORAL | Status: AC
  Administered 2010-12-10: 12.5 mg via ORAL
  Filled 2010-12-10: qty 1

## 2010-12-10 MED ORDER — POTASSIUM CHLORIDE 20 MEQ/15ML (10%) PO LIQD
20.0000 meq | Freq: Three times a day (TID) | ORAL | Status: DC
  Administered 2010-12-10: 20 meq via ORAL
  Filled 2010-12-10 (×2): qty 15

## 2010-12-10 MED ORDER — HEPARIN SOD (PORCINE) IN D5W 100 UNIT/ML IV SOLN
1100.0000 [IU]/h | INTRAVENOUS | Status: AC
  Administered 2010-12-11: 1100 [IU]/h via INTRAVENOUS
  Filled 2010-12-10 (×3): qty 250

## 2010-12-10 NOTE — Progress Notes (Signed)
Subjective:  Patient complains of left-sided jaw pain states seen dentist as outpatient and was given some antibiotics States breathing has improved overall feels better but complaints of occasional painting feeling in the chest and lower abdominal  after eating No anginal chest pain or shortness of breath  Objective:  Vital Signs in the last 24 hours: Temp:  [97.4 F (36.3 C)-98.5 F (36.9 C)] 97.4 F (36.3 C) (12/08 0418) Pulse Rate:  [60-71] 70  (12/08 0418) Resp:  [18-20] 20  (12/08 0418) BP: (112-127)/(81-84) 112/84 mmHg (12/08 0418) SpO2:  [96 %-98 %] 96 % (12/08 0418) Weight:  [100.7 kg (222 lb 0.1 oz)] 222 lb 0.1 oz (100.7 kg) (12/08 0418)  Intake/Output from previous day: 12/07 0701 - 12/08 0700 In: 851 [P.O.:840; I.V.:3; IV Piggyback:8] Out: 875 [Urine:875] Intake/Output from this shift: Total I/O In: 240 [P.O.:240] Out: -   Physical Exam: General appearance: alert and cooperative Neck: no adenopathy, no carotid bruit and no JVD Lungs: Decreased breath sounds at bases air entry markedly improved Heart: irregularly irregular rhythm and S1 and S2 is soft there is soft systolic murmur and S3 gallop Abdomen: soft, non-tender; bowel sounds normal; no masses,  no organomegaly Extremities: extremities normal, atraumatic, no cyanosis or edema  Lab Results:  [facility] 12/10/10 0610 12/09/10 0638  WBC 6.1 4.8  HGB 16.0 15.0  PLT 227 207    Basename 12/10/10 0610 12/09/10 0638  NA 138 139  K 3.5 3.3*  CL 100 101  CO2 24 28  GLUCOSE 127* 139*  BUN 32* 31*  CREATININE 1.90* 1.92*    Basename 12/08/10 2355 12/08/10 1559  TROPONINI <0.30 <0.30   Hepatic Function Panel  Basename 12/08/10 0202  PROT 7.2  ALBUMIN 3.1*  AST 33  ALT 28  ALKPHOS 52  BILITOT 0.6  BILIDIR --  IBILI --   No results found for this basename: CHOL in the last 72 hours No results found for this basename: PROTIME in the last 72 hours  Imaging: Imaging results have been  reviewed  Cardiac Studies:  Assessment/Plan:  Resolving decompensated systolic heart failure EF 15-20% Nonischemic dilated cardiomyopathy status post ICD in the past Chronic A. fib Nonsustained VT Glucose intolerance Chronic kidney disease History of sarcoidosis GERD Dental caries Plan And Pen VK  Will DC home once INR above 2  LOS: 3 days    Dynisha Due N 12/10/2010, 11:40 AM

## 2010-12-10 NOTE — Consult Note (Signed)
ANTICOAGULATION CONSULT NOTE: Follow-Up Consult    Pharmacy Consult for Heparin, Coumadin  Indication: chest pain/ACS and atrial fibrillation   Assessment:  [name] with Afib + CP/ACS on heparin bridge.  Heparin infusing at 1500 units/hr with a Heparin level of 0.72  ( goal 0.3-0.7. ) INR 1.55  Goal 2 - 3.  Plan:  Decrease Heparin infusion to 1250 units/hr.  Next Heparin level in 6 hours. Coumadin 12.5mg  today.  Kirsten Spearing, Elisha Headland, Pharm.D. 12/10/2010 2:15 PM

## 2010-12-10 NOTE — Consult Note (Signed)
ANTICOAGULATION CONSULT NOTE: Follow-Up Consult    Pharmacy Consult for Heparin, Coumadin  Indication: chest pain/ACS and atrial fibrillation   Assessment:  [name] with Afib + CP/ACS on heparin bridge.  Heparin infusing at 1250 units/hr with a Heparin level of 0.76 ( goal 0.3-0.7. )  Plan:  Decrease Heparin infusion to 1100 units/hr.   Next Heparin level/cbc in 6 hours.   [name] L. Illene Bolus, PharmD, BCPS Clinical Pharmacist Pager: [phone] 12/10/2010 10:12 PM

## 2010-12-11 ENCOUNTER — Inpatient Hospital Stay (HOSPITAL_COMMUNITY): Payer: PRIVATE HEALTH INSURANCE

## 2010-12-11 LAB — BASIC METABOLIC PANEL
BUN: 36 mg/dL — ABNORMAL HIGH (ref 6–23)
CO2: 25 mEq/L (ref 19–32)
Calcium: 9.9 mg/dL (ref 8.4–10.5)
Chloride: 102 mEq/L (ref 96–112)
Creatinine, Ser: 2.06 mg/dL — ABNORMAL HIGH (ref 0.50–1.35)
GFR calc Af Amer: 39 mL/min — ABNORMAL LOW (ref >90)
GFR calc non Af Amer: 33 mL/min — ABNORMAL LOW (ref >90)
Glucose, Bld: 108 mg/dL — ABNORMAL HIGH (ref 70–99)
Potassium: 4.6 mEq/L (ref 3.5–5.1)
Sodium: 137 mEq/L (ref 135–145)

## 2010-12-11 LAB — CBC
MCV: 89.5 fL (ref 78.0–100.0)
Platelets: 226 10*3/uL (ref 150–400)
RDW: 12.9 % (ref 11.5–15.5)
WBC: 5.4 10*3/uL (ref 4.0–10.5)

## 2010-12-11 LAB — PROTIME-INR
INR: 2.16 — ABNORMAL HIGH (ref 0.00–1.49)
Prothrombin Time: 24.5 seconds — ABNORMAL HIGH (ref 11.6–15.2)

## 2010-12-11 MED ORDER — WARFARIN SODIUM 10 MG PO TABS
10.0000 mg | ORAL_TABLET | Freq: Once | ORAL | Status: AC
  Administered 2010-12-11: 10 mg via ORAL
  Filled 2010-12-11: qty 1

## 2010-12-11 NOTE — Progress Notes (Signed)
ANTICOAGULATION CONSULT NOTE: Follow-Up Consult    Pharmacy Consult for Heparin, Coumadin  Indication: chest pain/ACS and atrial fibrillation    Assessment: Day # 4/5 Coumadin + Heparin Bridge   [name] with Afib + CP/ACS on heparin bridge. Heparin infusing at 1100units/hr with a Heparin level of 0.54   ( goal 0.3-0.7. )  INR 2.16  with goal  2 - 3  Plan: Continue Heparin infusion until tomorrow at 6 AM  (will be Day #5/5) Coumadin 10 mg today.  Laportia Carley, Elisha Headland, Pharm.D. 12/11/2010 2:15 PM

## 2010-12-11 NOTE — Progress Notes (Signed)
Patient had 14 beat run v-tach. Patient asleep in bed.  Will continue to monitor patient.  [name]

## 2010-12-11 NOTE — Progress Notes (Signed)
Subjective:  Patient denies any chest pain or shortness of breath Left upper jaw pain is resolved denies any fever or chills  wishes to see her Dr. Katrinka Blazing for second opinion prior to discharge Objective:  Vital Signs in the last 24 hours: Temp:  [97.7 F (36.5 C)-98.7 F (37.1 C)] 97.7 F (36.5 C) (12/09 0545) Pulse Rate:  [60-72] 66  (12/09 0938) Resp:  [16-18] 16  (12/09 0545) BP: (93-123)/(59-87) 123/87 mmHg (12/09 0937) SpO2:  [96 %-98 %] 97 % (12/09 0545) Weight:  [100.4 kg (221 lb 5.5 oz)] 221 lb 5.5 oz (100.4 kg) (12/09 0545)  Intake/Output from previous day: 12/08 0701 - 12/09 0700 In: 1058.8 [P.O.:660; I.V.:382.8; IV Piggyback:16] Out: 1955 [Urine:1955] Intake/Output from this shift: Total I/O In: 120 [P.O.:120] Out: -   Physical Exam: General appearance: alert and cooperative Neck: no carotid bruit, no JVD and supple, symmetrical, trachea midline Lungs: clear to auscultation bilaterally Heart: irregularly irregular rhythm and S1-S2 soft there is soft systolic murmur and S3 gallop Abdomen: soft, non-tender; bowel sounds normal; no masses,  no organomegaly Extremities: extremities normal, atraumatic, no cyanosis or edema  Lab Results:  Basename 12/11/10 0500 12/10/10 0610  WBC 5.4 6.1  HGB 15.8 16.0  PLT 226 227    Basename 12/11/10 0500 12/10/10 0610  NA 137 138  K 4.6 3.5  CL 102 100  CO2 25 24  GLUCOSE 108* 127*  BUN 36* 32*  CREATININE 2.06* 1.90*    Basename 12/08/10 2355 12/08/10 1559  TROPONINI <0.30 <0.30   Hepatic Function Panel No results found for this basename: PROT,ALBUMIN,AST,ALT,ALKPHOS,BILITOT,BILIDIR,IBILI in the last 72 hours No results found for this basename: CHOL in the last 72 hours No results found for this basename: PROTIME in the last 72 hours  Imaging: Imaging results have been reviewed  Cardiac Studies:  Assessment/Plan:  Resolving decompensated systolic heart failure Nonischemic dilated cardiomyopathy status post  ICD Chronic atrial fibrillation Nonsustained VT asymptomatic Hypertension Chronic kidney disease Glucose intolerance GERD History of sarcoidosis Dental caries Plan Continue present management Check basic metabolic panel and pro BNP in a.m. Check chest x-ray in a.m. We will DC home tomorrow once seen by Dr. Katrinka Blazing  and if no further recommendation  LOS: 4 days    Joren Rehm N 12/11/2010, 10:52 AM

## 2010-12-11 NOTE — Progress Notes (Addendum)
ANTICOAGULATION CONSULT NOTE - Follow Up Consult  Pharmacy Consult for heparin Indication: Afib/CP  No Active Allergies  Patient Measurements: Height: 6\' 3"  (190.5 cm) Weight: 221 lb 5.5 oz (100.4 kg) IBW/kg (Calculated) : 84.5   Vital Signs: Temp: 97.7 F (36.5 C) (12/09 0545) Temp src: Oral (12/09 0545) BP: 93/59 mmHg (12/09 0545) Pulse Rate: 72  (12/09 0545)  Labs:  Basename 12/11/10 0500 12/10/10 2055 12/10/10 0610 12/09/10 4098 12/08/10 2355 12/08/10 1559 12/08/10 0950  HGB 15.8 -- 16.0 -- -- -- --  HCT 47.0 -- 46.9 43.6 -- -- --  PLT 226 -- 227 207 -- -- --  APTT -- -- -- -- -- -- --  LABPROT 24.5* -- 18.9* 15.7* -- -- --  INR 2.16* -- 1.55* 1.22 -- -- --  HEPARINUNFRC 0.68 0.76* 0.72* -- -- -- --  CREATININE 2.06* -- 1.90* 1.92* -- -- --  CKTOTAL -- -- -- -- 135 170 174  CKMB -- -- -- -- 3.5 4.1* 4.3*  TROPONINI -- -- -- -- <0.30 <0.30 <0.30   Estimated Creatinine Clearance: 45.6 ml/min (by C-G formula based on Cr of 2.06).   Medications:  Scheduled:    . allopurinol  100 mg Oral Daily  . amiodarone  200 mg Oral Daily  . antiseptic oral rinse  15 mL Mouth Rinse BID  . carvedilol  3.125 mg Oral Q12H  . digoxin  250 mcg Oral Daily  . furosemide  80 mg Intravenous BID  . pantoprazole  40 mg Oral Daily  . penicillin v potassium  500 mg Oral Q6H  . potassium chloride  20 mEq Oral TID  . rosuvastatin  5 mg Oral q1800  . sodium chloride  3 mL Intravenous Q12H  . sucralfate  1 g Oral QID  . warfarin  12.5 mg Oral ONCE-1800  . DISCONTD: potassium chloride  20 mEq Oral TID  . DISCONTD: potassium chloride  20 mEq Oral TID   Infusions:    . heparin 1,100 Units/hr (12/10/10 2301)  . DISCONTD: heparin 1,250 Units/hr (12/10/10 1842)    Assessment: 60yo male now therapeutic on heparin after rate changes.  Goal of Therapy:  Heparin level 0.3-0.5   Plan:  Will continue heparin gtt at current rate and confirm stable with additional level.  Colleen Can PharmD BCPS 12/11/2010,7:35 AM

## 2010-12-12 LAB — CBC
HCT: 48.3 % (ref 39.0–52.0)
Hemoglobin: 15.9 g/dL (ref 13.0–17.0)
MCH: 29.6 pg (ref 26.0–34.0)
MCHC: 32.9 g/dL (ref 30.0–36.0)

## 2010-12-12 LAB — BASIC METABOLIC PANEL
BUN: 35 mg/dL — ABNORMAL HIGH (ref 6–23)
Calcium: 9.8 mg/dL (ref 8.4–10.5)
GFR calc non Af Amer: 30 mL/min — ABNORMAL LOW (ref >90)
Glucose, Bld: 97 mg/dL (ref 70–99)
Sodium: 137 mEq/L (ref 135–145)

## 2010-12-12 LAB — PROTIME-INR: INR: 2.43 — ABNORMAL HIGH (ref 0.00–1.49)

## 2010-12-12 MED ORDER — WARFARIN SODIUM 7.5 MG PO TABS
7.5000 mg | ORAL_TABLET | Freq: Once | ORAL | Status: AC
  Administered 2010-12-12: 7.5 mg via ORAL
  Filled 2010-12-12: qty 1

## 2010-12-12 MED ORDER — FUROSEMIDE 80 MG PO TABS
80.0000 mg | ORAL_TABLET | Freq: Two times a day (BID) | ORAL | Status: DC
  Administered 2010-12-13 (×2): 80 mg via ORAL
  Filled 2010-12-12 (×5): qty 1

## 2010-12-12 NOTE — Progress Notes (Signed)
Subjective:  Patient complains of vague chest pain without any associated symptoms Tolerating heparin and Coumadin well his INR isn't therapeutic range Pro BNP is gradually trending down  Objective:  Vital Signs in the last 24 hours: Temp:  [97.1 F (36.2 C)-97.9 F (36.6 C)] 97.9 F (36.6 C) (12/10 1342) Pulse Rate:  [53-80] 80  (12/10 1342) Resp:  [18-20] 20  (12/10 1342) BP: (104-126)/(68-79) 107/71 mmHg (12/10 1342) SpO2:  [95 %-97 %] 97 % (12/10 1342) Weight:  [100.2 kg (220 lb 14.4 oz)] 220 lb 14.4 oz (100.2 kg) (12/10 0452)  Intake/Output from previous day: 12/09 0701 - 12/10 0700 In: 1403.6 [P.O.:1020; I.V.:383.6] Out: 1700 [Urine:1700] Intake/Output from this shift: Total I/O In: 360 [P.O.:360] Out: 825 [Urine:825]  Physical Exam: General appearance: alert and cooperative Neck: no carotid bruit, no JVD and supple, symmetrical, trachea midline Lungs: clear to auscultation bilaterally Heart: irregularly irregular rhythm and S1-S2 is soft there is no S3 gallop Abdomen: soft, non-tender; bowel sounds normal; no masses,  no organomegaly Extremities: extremities normal, atraumatic, no cyanosis or edema Pulses: 2+ and symmetric  Lab Results:  Basename 12/12/10 0635 12/11/10 0500  WBC 4.4 5.4  HGB 15.9 15.8  PLT 217 226    Basename 12/12/10 0635 12/11/10 0500  NA 137 137  K 4.3 4.6  CL 101 102  CO2 26 25  GLUCOSE 97 108*  BUN 35* 36*  CREATININE 2.26* 2.06*   No results found for this basename: TROPONINI:2,CK,MB:2 in the last 72 hours Hepatic Function Panel No results found for this basename: PROT,ALBUMIN,AST,ALT,ALKPHOS,BILITOT,BILIDIR,IBILI in the last 72 hours No results found for this basename: CHOL in the last 72 hours No results found for this basename: PROTIME in the last 72 hours  Imaging: Imaging results have been reviewed  Cardiac Studies:  Assessment/Plan:  Compensated systolic heart failure Nonischemic dilated cardiomyopathy status post  ICD in the past Chronic atrial fibrillation Status post nonsustained ventricular tachycardia asymptomatic Hypertension Chronic kidney disease Glucose intolerance GERD History of sarcoidosis  dental caries Plan Agree with DC heparin Continue with present management Patient requesting to see Dr. Katrinka Blazing  prior to discharge  LOS: 5 days    Davide Risdon N 12/12/2010, 6:24 PM

## 2010-12-12 NOTE — Progress Notes (Addendum)
ANTICOAGULATION CONSULT NOTE: Follow-Up Consult    Pharmacy Consult for Heparin, Coumadin  Indication: chest pain/ACS and atrial fibrillation    Assessment: Day # 5/5 Coumadin + Heparin Bridge   [name] with Afib + CP/ACS on heparin bridge.  Heparin level 0.62 (goal 0.3-0.7), INR 2.43 (goal 2-3) - therapeutic.  Hgb 15.9 -stable.  No bleed noted per notes.  ACS/Chest pain resolved, and pt awaiting d/c.    Amiodarone initiated this admission, which takes weeks to achieve steady state and can elevate INR.  Must monitor INR closely after d/c.     INR 2.43  with goal  2 - 3  Plan: D/C heparin Coumadin 7.5 mg today.  Carmella Kees E, Pharm.D. 12/12/2010 9:23 AM

## 2010-12-13 LAB — CBC
HCT: 51.8 % (ref 39.0–52.0)
Hemoglobin: 18 g/dL — ABNORMAL HIGH (ref 13.0–17.0)
MCH: 31.2 pg (ref 26.0–34.0)
MCHC: 34.7 g/dL (ref 30.0–36.0)

## 2010-12-13 MED ORDER — PENICILLIN V POTASSIUM 500 MG PO TABS: 500.0000 mg | ORAL_TABLET | Freq: Four times a day (QID) | ORAL | Status: AC

## 2010-12-13 MED ORDER — FUROSEMIDE 80 MG PO TABS: 80.0000 mg | ORAL_TABLET | Freq: Two times a day (BID) | ORAL | Status: DC

## 2010-12-13 MED ORDER — AMIODARONE HCL 200 MG PO TABS: 200.0000 mg | ORAL_TABLET | Freq: Every day | ORAL | Status: DC

## 2010-12-13 MED ORDER — WARFARIN SODIUM 10 MG PO TABS: 10.0000 mg | ORAL_TABLET | Freq: Once | ORAL | Status: DC

## 2010-12-13 MED ORDER — CARVEDILOL 6.25 MG PO TABS: 6.2500 mg | ORAL_TABLET | Freq: Two times a day (BID) | ORAL | Status: DC

## 2010-12-13 MED ORDER — WARFARIN SODIUM 7.5 MG PO TABS
7.5000 mg | ORAL_TABLET | Freq: Once | ORAL | Status: AC
  Administered 2010-12-13: 7.5 mg via ORAL
  Filled 2010-12-13: qty 1

## 2010-12-13 MED ORDER — POTASSIUM CHLORIDE CRYS ER 20 MEQ PO TBCR: 20.0000 meq | EXTENDED_RELEASE_TABLET | Freq: Two times a day (BID) | ORAL | Status: DC

## 2010-12-13 NOTE — Consults (Signed)
Admit date: 12/07/2010 Referring Physician  Dr. [name] Primary Physician Robynn Pane, MD, MD Primary Cardiologist  Dr. [name] Reason for Consultation  Evaluation of nonischemic cardiomyopathy, heart failure, second opinion at the request of patient.  HPI: 60 year old male with nonischemic cardiomyopathy EF 20-25% on echo in system 09/05/09, chronic systolic heart failure, defibrillator placed by [name], chronic kidney disease with creatinine of approximately 2.1, atrial fibrillation with chronic anticoagulation, hypertension, sarcoidosis who was admitted for increased shortness of breath, increased abdominal girth and symptoms of acute on chronic systolic heart failure.  Since his hospitalization he has diuresed with IV furosemide. His net output is 4.9 L and he overall feels better. He states that at home he has not seen a dramatic effect from Lasix 80 mg by mouth twice a day. He also admits that over the holidays he may have had more salt at church functions. He admits that he had had increased abdominal girth and sacral edema.  He has a history of non-sustained ventricular tachycardia and this was demonstrated on telemetry while he was admitted. Telemetry was personally viewed.  Currently he is ambulating the hallway well without any significant difficulty. He does at times feel some chest pressure/epigastric pressure when he walks. I reassured him that his prior cardiac catheterization demonstrated a nonischemic cardiomyopathy. I discussed this with Dr. [name].  He relates [place] a strong family history of cardiovascular disease. He had a nephew that was 77 years old that he says died from a massive heart attack. He is sister in her 30s and died from a "massive heart attack. I wonder if there was a degree of hypertrophic cardiomyopathy that may have led to their sudden death.  I discussed his digoxin dosing with Dr. [name]. With his chronic kidney disease he has checked a digoxin level and  it is in the normal range.  In regards to nonsustained ventricular tachycardia, he is on amiodarone as well as carvedilol. He has a defibrillator in place for protection. The patient did admit that he has been noncompliant with checking his defibrillator that was placed by [name]. I encouraged him to see their device clinic for interrogation.    PMH:   Past Medical History  Diagnosis Date  . Hypertension   . CHF (congestive heart failure)   . Sarcoidosis   . Cardiomyopathy     PSH:   Past Surgical History  Procedure Date  . Back surgery 1987    Ruptured disk repair   Allergies:  Review of patient's allergies indicates no active allergies. Prior to Admit Meds:   Prescriptions prior to admission  Medication Sig Dispense Refill  . allopurinol (ZYLOPRIM) 100 MG tablet Take 100 mg by mouth daily.        . carvedilol (COREG) 25 MG tablet Take 12.5 mg by mouth 2 (two) times daily with a meal.        . digoxin (LANOXIN) 0.25 MG tablet Take 250 mcg by mouth daily.        . pantoprazole (PROTONIX) 40 MG tablet Take 40 mg by mouth daily.        . rosuvastatin (CRESTOR) 5 MG tablet Take 5 mg by mouth daily.        . sucralfate (CARAFATE) 1 G tablet Take 1 g by mouth 4 (four) times daily.        Marland Kitchen warfarin (COUMADIN) 10 MG tablet Take 12 mg by mouth daily. Pt cuts tab into 4 pieces to make 12 mg  Fam HX:   History reviewed. No pertinent family history. Social HX:    History   Social History  . Marital Status: Divorced    Spouse Name: N/A    Number of Children: N/A  . Years of Education: N/A   Occupational History  . Not on file.   Social History Main Topics  . Smoking status: Never Smoker   . Smokeless tobacco: Not on file  . Alcohol Use: No  . Drug Use: No  . Sexually Active: Not Currently   Other Topics Concern  . Not on file   Social History Narrative  . No narrative on file     ROS:  All 11 ROS were addressed and are negative except what is stated in the  HPI  Physical Exam: Blood pressure 115/80, pulse 79, temperature 97.4 F (36.3 C), temperature source Oral, resp. rate 18, height 6\' 3"  (1.905 m), weight 99.3 kg (218 lb 14.7 oz), SpO2 95.00%.    General: Well developed, well nourished, in no acute distress Head: Eyes PERRLA, No xanthomas.   Normal cephalic and atramatic  Lungs:   Clear bilaterally to auscultation and percussion. Normal respiratory effort. No wheezes, no rales. Heart:  Irreg Irreg S1 S2 Pulses are 2+ & equal.            No carotid bruit. Mild JVD.   Abdomen: Bowel sounds are positive, abdomen soft and non-tender, mildly distended. Msk:  Back normal.  Normal strength and tone for age. Extremities:   No clubbing, cyanosis or edema.  DP +1 Neuro: Alert and oriented X 3, non-focal, MAE x 4 GU: Deferred Rectal: Deferred Psych:  Good affect, responds appropriately    Labs:   Lab Results  Component Value Date   WBC 5.5 12/13/2010   HGB 18.0* 12/13/2010   HCT 51.8 12/13/2010   MCV 89.8 12/13/2010   PLT 219 12/13/2010    Lab 12/12/10 0635 12/08/10 0202  NA 137 --  K 4.3 --  CL 101 --  CO2 26 --  BUN 35* --  CREATININE 2.26* --  CALCIUM 9.8 --  PROT -- 7.2  BILITOT -- 0.6  ALKPHOS -- 52  ALT -- 28  AST -- 33  GLUCOSE 97 --   No results found for this basename: PTT   Lab Results  Component Value Date   INR 2.52* 12/13/2010   INR 2.43* 12/12/2010   INR 2.16* 12/11/2010   Lab Results  Component Value Date   CKTOTAL 135 12/08/2010   CKMB 3.5 12/08/2010   TROPONINI <0.30 12/08/2010        Radiology:  Dg Chest 2 View  12/11/2010  *RADIOLOGY REPORT*  Clinical Data: Short of breath.  Cough.  Congestive heart failure.  CHEST - 2 VIEW  Comparison: 12/07/2010  Findings: Mild cardiomegaly stable.  AICD remains in appropriate position.  Both lungs remain clear.  No evidence of pleural effusion.  No mass or lymphadenopathy identified.  IMPRESSION: Stable cardiomegaly.  No active lung disease.  Original Report  Authenticated By: Danae Orleans, M.D.    EKG:  Atrial fibrillation, rate controlled, left anterior fascicular block  ASSESSMENT/PLAN:    Acute on Chronic systolic heart failure - nonischemic cardiomyopathy possibly from hypertension/sarcoidosis  I reviewed his medications and current medical strategy. I agree with current plan. Some suggestions are to increase his home Lasix dose from 80 mg twice a day up to 160 mg twice a day. With his abdominal distention, he may not be absorbing the  Lasix as well either. You could consider Demadex which sometimes is absorbed better in these situations. I agree with beta blocker usage and at one point he was on 25 mg twice a day. Continue to try to uptitrate carvedilol. Blood pressure is likely the issue. I agree with amiodarone given both his atrial fibrillation as well as nonsustained ventricular tachycardia. Defibrillator is in place and I encouraged him to have this interrogated, [name] placed. Given his chronic kidney disease and increased creatinine, he likely is not a candidate at this point for ACE inhibitor or ARB. I asked him about hydralazine and he has taken this in the past. He does not need this at this point do to lower blood pressure. I also discussed with he the digoxin and his levels have been checked recently and her fine.  We had it checked about the importance of continuation with Dr. [name] secondary to his long-standing relationship of understanding the complexity of his medical case. Nonetheless, I am happy to participate in his medical care if necessary.  Atrial fibrillation-currently well rate controlled. Amiodarone. Beta blocker.  Chronic anticoagulation-INR is therapeutic.  Hypertension-stable, well controlled.  He seems able to be discharged. I would encourage close followup within 7 days. Another possibility for him would be the [place] [facility] failure clinic necessary.  I have discussed the findings with Dr.  [name].     Donato Schultz, MD  12/13/2010  10:18 AM

## 2010-12-13 NOTE — Progress Notes (Signed)
ANTICOAGULATION CONSULT NOTE: Follow-Up Consult    Pharmacy Consult for Heparin, Coumadin  Indication: chest pain/ACS and atrial fibrillation    Assessment: [name] with Afib + Chest pain/ACS on Coumadin.  INR 2.43-->2.52 therapeutic.  Hgb stable.  No bleed noted per notes.   Amiodarone initiated this admission, which takes weeks to achieve steady state and can elevate INR.  Must monitor INR closely after d/c.     INR 2.52  with goal  2 - 3  Plan: Coumadin 7.5 mg PO x 1 today.  Briunna Leicht E, Pharm.D. 12/13/2010 9:38 AM

## 2010-12-13 NOTE — Discharge Summary (Signed)
  Discharge summary dictated on 12/13/2010 dictation number is [phone]

## 2010-12-13 NOTE — Discharge Summary (Signed)
[name], [name]             ACCOUNT NO.:  [id number]  MEDICAL RECORD NO.:  [id number]  LOCATION:  4705                         FACILITY:  [facility]  PHYSICIAN:  [name]. [name], M.D. DATE OF BIRTH:  Jun 18, 1950  DATE OF ADMISSION:  12/07/2010 DATE OF DISCHARGE:  12/13/2010                              DISCHARGE SUMMARY   ADMITTING DIAGNOSES: 1. Decompensated systolic heart failure. 2. Nonischemic dilated cardiomyopathy status post biventricular     implantable cardioverter defibrillator in the past. 3. Hypertension. 4. Chronic atrial fibrillation. 5. History of nonsustained ventricular tachycardia, asymptomatic. 6. Glucose intolerance. 7. Chronic kidney disease. 8. History of sarcoidosis. 9. Gastroesophageal reflux disease.  DISCHARGE DIAGNOSES: 1. Compensated systolic heart failure, severe. 2. Severe nonischemic dilated cardiomyopathy status post biventricular     implantable cardioverter defibrillator. 3. Hypertension. 4. Chronic atrial fibrillation. 5. Status post nonsustained ventricular tachycardia, asymptomatic. 6. Glucose intolerance. 7. Chronic kidney disease. 8. History of sarcoidosis. 9. Gastroesophageal reflux disease.  DISCHARGE HOME MEDICATIONS: 1. Amiodarone 200 mg 1 tablet daily. 2. Lasix 80 mg 1 tablet twice daily. 3. Potassium chloride 20 mEq 1 tablet twice daily. 4. Carvedilol 6.25 mg 1 tablet twice daily. 5. Warfarin 10 mg 1 tablet daily. 6. Allopurinol 100 mg 1 tablet daily. 7. Digoxin 0.25 mg 1 tablet daily. 8. Protonix 40 mg 1 tablet daily. 9. Crestor 5 mg 1 tablet daily. 10.Carafate 1 g 4 times daily. 11.Penicillin V potassium 500 mg 1 tablet every 6 hours for 7 more     days.  DIET:  Low salt, low cholesterol.  ACTIVITY:  Increase activity slowly as tolerated.  Heart failure instructions have been given.  The patient has been advised to monitor weight daily.  If he gains 3 pounds in 1 day or 5 pounds in a week, or if he notices any  abdominal swelling, shortness of breath, leg swelling, difficulty breathing, should call my office.  The patient has been advised to monitor blood pressure and weights daily.  Follow up with me in 1 week.  The patient has been advised to refrain from excessive salty food intake.  CONDITION AT DISCHARGE:  Stable.  BRIEF HISTORY AND HOSPITAL COURSE:  [name] is a 60 year old black male with past medical history significant for multiple medical problems, i.e., nonischemic dilated cardiomyopathy, status post ICD in the past, history of recurrent systolic heart failure secondary to noncompliance to meds and diet, hypertension, chronic atrial fibrillation, history of nonsustained VT, glucose intolerance, GERD, chronic kidney disease, history of sarcoidosis.  He came to the ER complaining of progressive increasing shortness of breath associated with abdominal swelling and weight gain of approximately 20 pounds in the last few weeks.  The patient gives history of PND, orthopnea, and minimal leg swelling.  Denies any anginal chest pain, nausea, vomiting, diaphoresis.  States he feels occasional palpitations, denies any lightheadedness or syncopal episodes.  Denies any ICD discharges.  The patient denies any salty food intake.  States he has been taking his medications recently, in fact he increased his does of Lasix without improvement in his breathing so decided to come to ED.  The patient denies any cough, fever, chills.  Denies any urinary complaints.  The patient  was noted to have BNP of above 6000.  The patient received IV Lasix in the ED with improvement in his symptoms.  PAST MEDICAL HISTORY:  As above.  PAST SURGICAL HISTORY:  He had back surgery in the past.  SOCIAL HISTORY:  The patient denies any social history.  Denies history of smoking or drug abuse.  ALLERGIES:  No known drug allergies.  PHYSICAL EXAMINATION:  GENERAL:  He was alert, awake, oriented x3. VITAL SIGNS:   His blood pressure was 126/90, pulse was 94. EYES:  Conjunctivae was pink. NECK:  Supple.  Positive JVD.  No thyromegaly. CARDIOVASCULAR:  Irregularly irregular, S1 and S2 were normal.  There was soft systolic murmur and S3 gallop. LUNGS:  He has bibasilar rales. ABDOMEN:  Soft.  Bowel sounds were present.  There was mild abdominal distention.  There was no rebound tenderness. EXTREMITIES:  There was no clubbing, cyanosis.  Trace edema.  LABORATORY DATA:  His sodium was 140, potassium 4.0, BUN 27, creatinine 1.72.  Hemoglobin 13.5, hematocrit 40.3, white count of 5.9.  His PT was 16.4, INR of 1.30, repeat PT was 18.9, INR 1.55.  Today PT is 27.6, INR 2.52.  His TSH is 5.23 which is slightly elevated.  His proBNP was 6737, repeat proBNP was 4062.  Repeat BNP on December 09, 2010, was 2072, December 12, 2010, BNP was trending down to 755.  Chest x-ray showed stable cardiomegaly and AICD, no acute cardiopulmonary process.  BRIEF HOSPITAL COURSE:  The patient was admitted to telemetry unit.  MI was ruled out by serial enzymes and EKG.  His p.o. Lasix was switched to IV Lasix with good diuresis with gradual normalization of his proBNP. The patient did not had any episodes of anginal chest pain during the hospital stay.  His ACE inhibitors and ARBs are held in view of chronic renal insufficiency.  The patient requested for a second opinion with Dr. Katrinka [name].  Dr. Anne [name] saw him in consult.  The patient's blood pressure has been in low 100s.  Coreg dose has been increased.  We will start BiDil as an outpatient if his blood pressure tolerates.  The patient was on BiDil in the past, but could not afford it, and then was switched to isosorbide and hydralazine generic, but he could not tolerate.  The patient was discussed in the past regarding further evaluation for cardiac transplant but has refused.  The patient will follow up with me in 1 week.  We will discuss with him if he still wishes to  follow up with Dr. Katrinka [name] and we will schedule him as an outpatient.     [name]. [name], M.D.     [facility]MED[facility]  D:  12/13/2010  T:  12/13/2010  Job:  [id number]

## 2010-12-14 ENCOUNTER — Encounter: Payer: Self-pay | Admitting: Cardiology

## 2010-12-15 IMAGING — CT CT HEAD W/O CM
1 series · 16 of 30 positions shown, 20 images · non-contrast
Comparison: 06/21/2008

CLINICAL DATA: Posterior headache.  Dizziness.  Hypertension.

CT HEAD WITHOUT CONTRAST
TECHNIQUE: Contiguous axial images were obtained from the base of
the skull through the vertex without contrast.

[Series 2: headseq 4.8 h45s · axial · 0.39mm/px · z∈[-130,+3]mm · 16 of 30 slices shown, 20 images]
[im 2/30  brain]
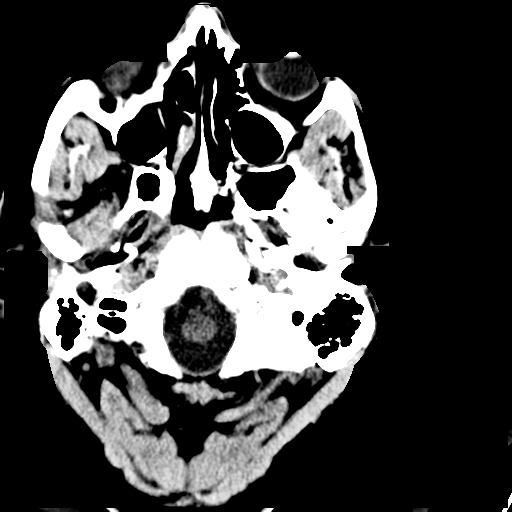
[im 2/30  bone]
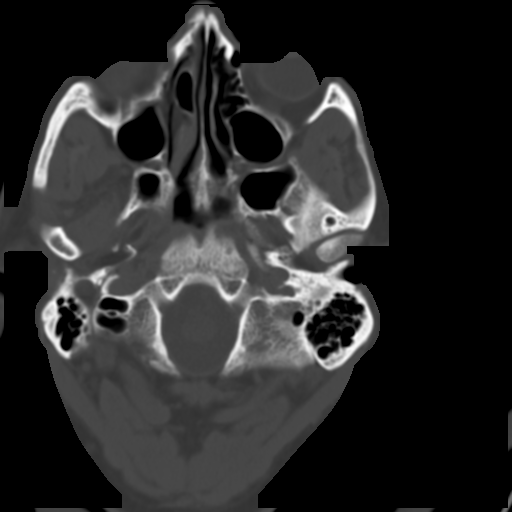
[im 4/30  brain]
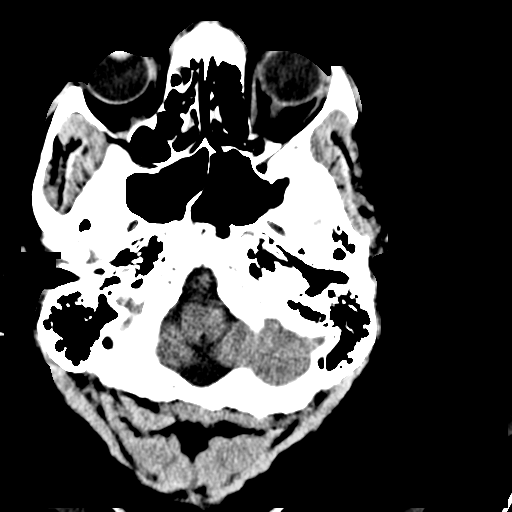
[im 6/30  brain]
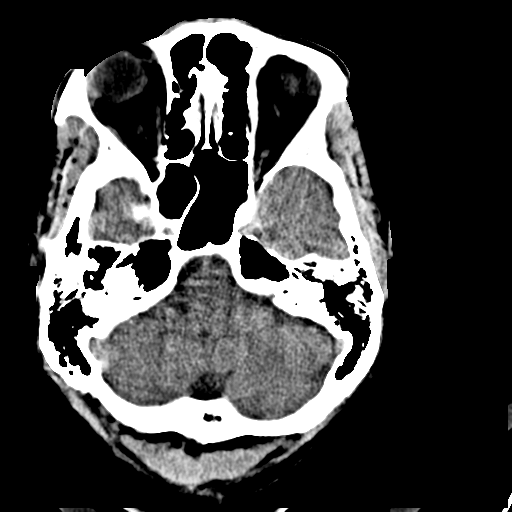
[im 8/30  brain]
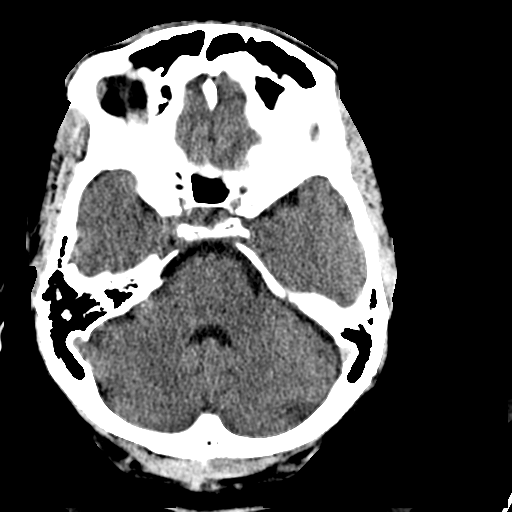
[im 9/30  brain]
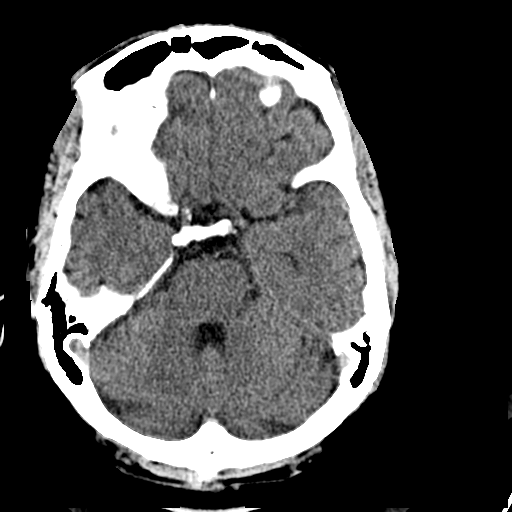
[im 9/30  bone]
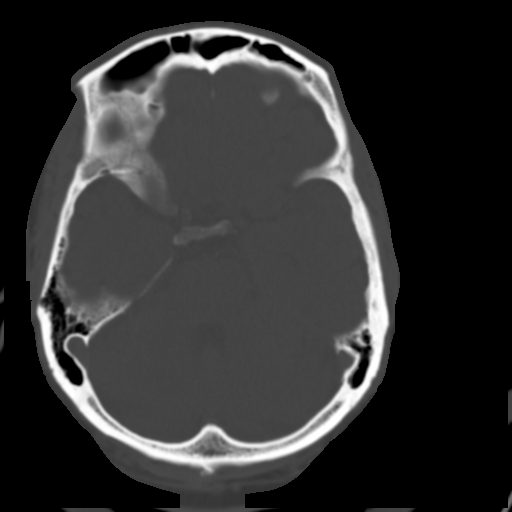
[im 11/30  brain]
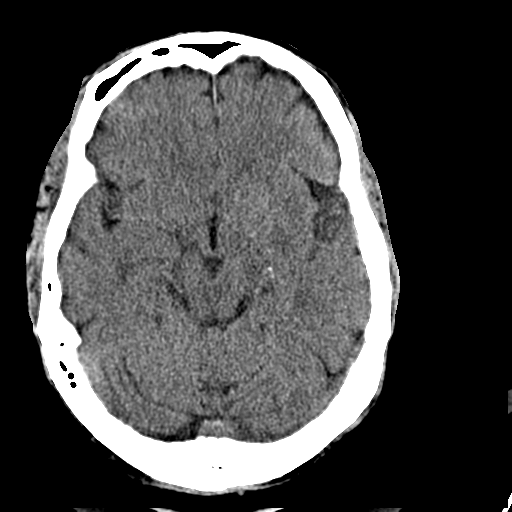
[im 13/30  brain]
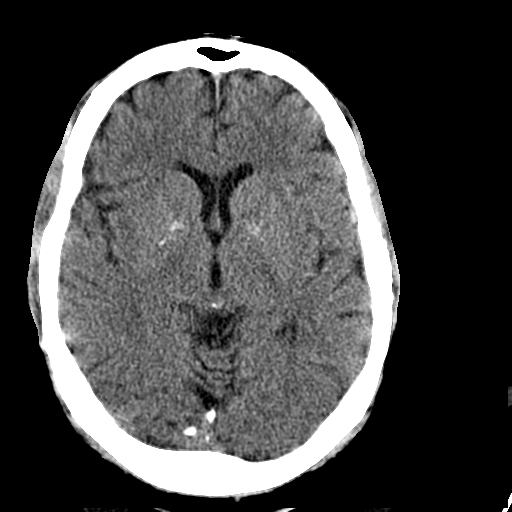
[im 15/30  brain]
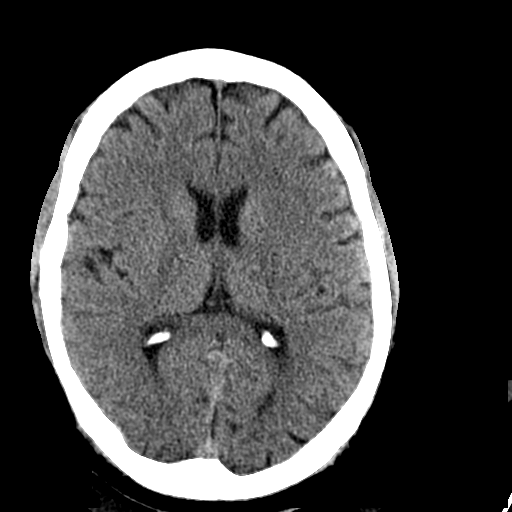
[im 16/30  brain]
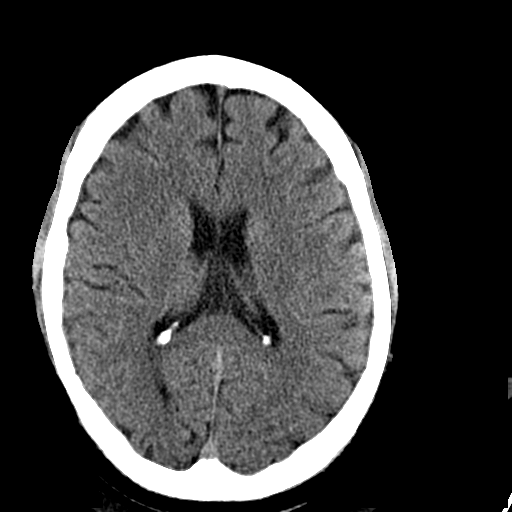
[im 16/30  bone]
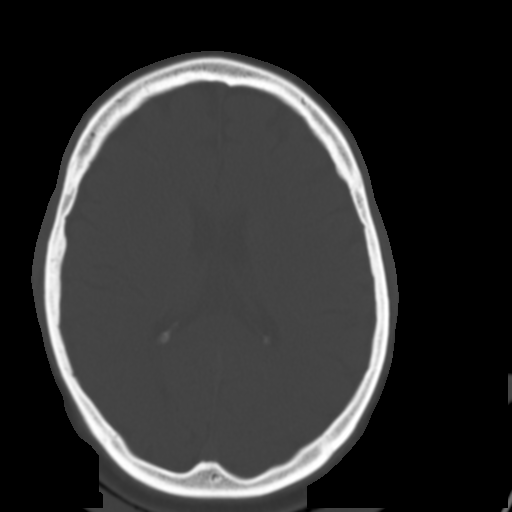
[im 18/30  brain]
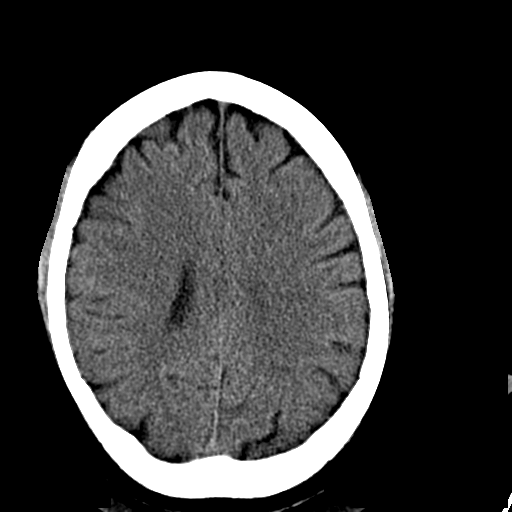
[im 20/30  brain]
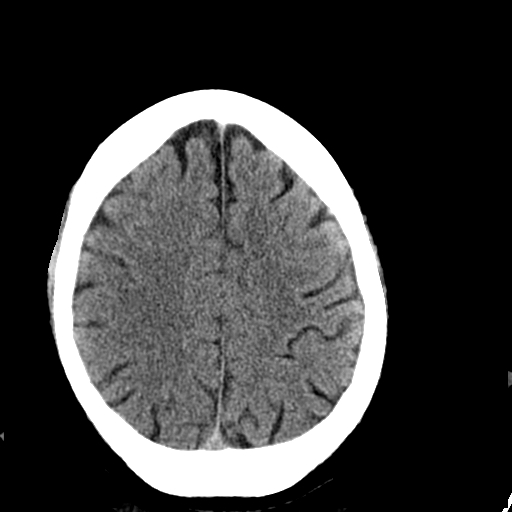
[im 22/30  brain]
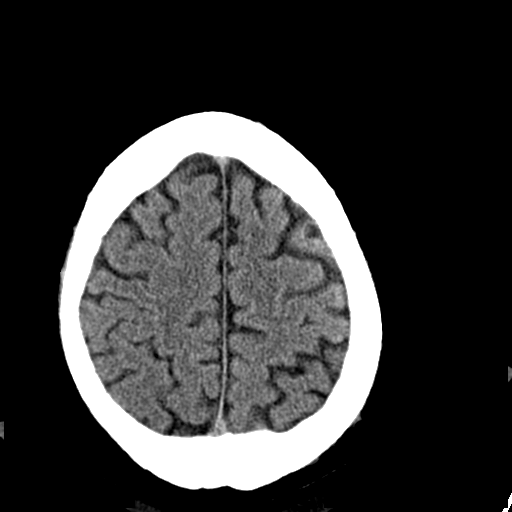
[im 23/30  brain]
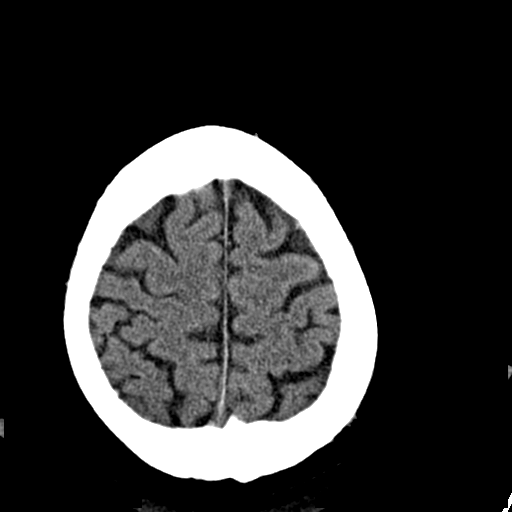
[im 23/30  bone]
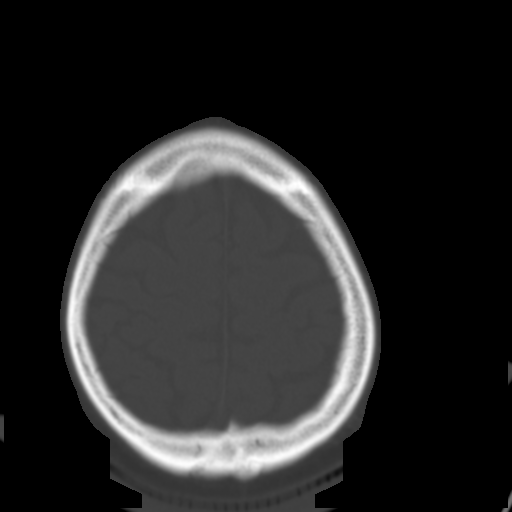
[im 25/30  brain]
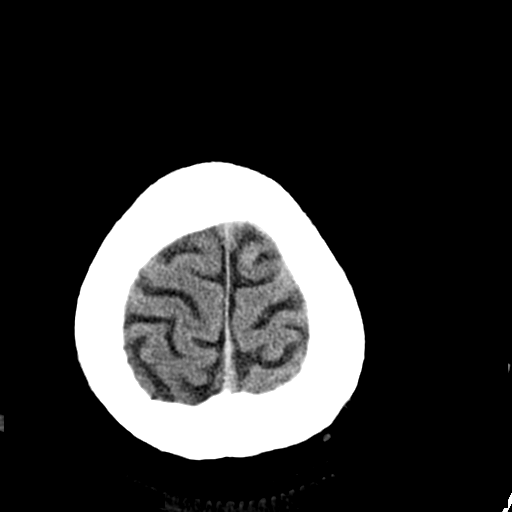
[im 27/30  brain]
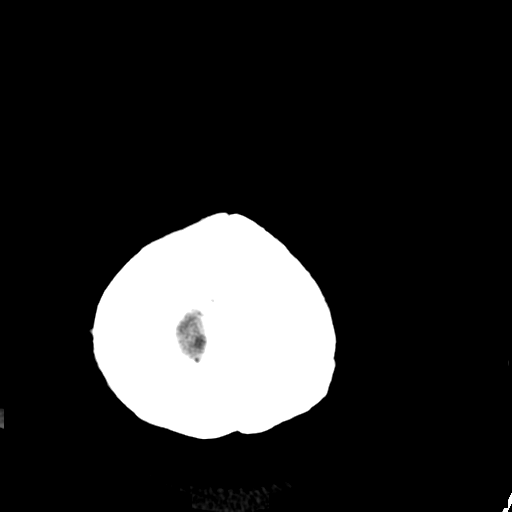
[im 29/30  brain]
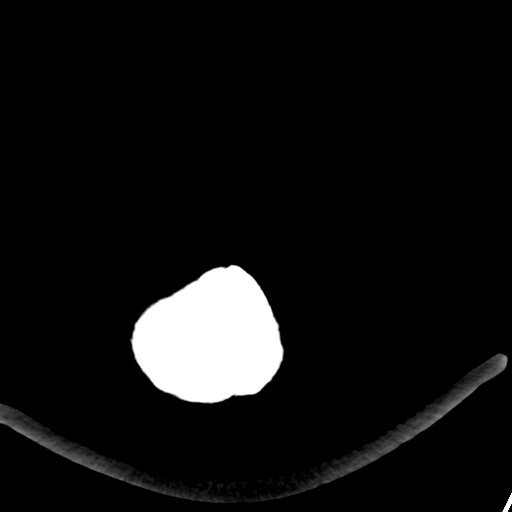

[16 of 30 positions shown; findings below may reference images not displayed]

FINDINGS: Bone windows demonstrate partially aerated petrous
apices. Clear paranasal sinuses and mastoid air cells.

Soft tissue windows demonstrate mildly age advanced cerebral
atrophy.  Physiologic calcifications in the basal ganglia.  Dural
based calcifications. No  mass lesion, hemorrhage, hydrocephalus,
acute infarct, intra-axial, or extra-axial fluid collection.
IMPRESSION: Mildly age advanced cerebral atrophy without acute finding.

## 2011-01-03 HISTORY — PX: PERIPHERALLY INSERTED CENTRAL CATHETER INSERTION: SHX2221

## 2011-01-06 ENCOUNTER — Encounter: Payer: Self-pay | Admitting: Internal Medicine

## 2011-01-10 ENCOUNTER — Ambulatory Visit (INDEPENDENT_AMBULATORY_CARE_PROVIDER_SITE_OTHER): Payer: PRIVATE HEALTH INSURANCE | Admitting: Internal Medicine

## 2011-01-10 ENCOUNTER — Other Ambulatory Visit: Payer: Self-pay | Admitting: Cardiology

## 2011-01-10 ENCOUNTER — Encounter: Payer: Self-pay | Admitting: Internal Medicine

## 2011-01-10 DIAGNOSIS — I5021 Acute systolic (congestive) heart failure: Secondary | ICD-10-CM

## 2011-01-10 DIAGNOSIS — I428 Other cardiomyopathies: Secondary | ICD-10-CM

## 2011-01-10 DIAGNOSIS — Z95810 Presence of automatic (implantable) cardiac defibrillator: Secondary | ICD-10-CM

## 2011-01-10 DIAGNOSIS — I509 Heart failure, unspecified: Secondary | ICD-10-CM

## 2011-01-10 LAB — ICD DEVICE OBSERVATION
BATTERY VOLTAGE: 2.9027 V
BRDY-0002RV: 40 {beats}/min
RV LEAD AMPLITUDE: 11.7 mv
RV LEAD IMPEDENCE ICD: 400 Ohm
TOT-0007: 1
TOT-0010: 7
TZAT-0013SLOWVT: 2
TZAT-0019SLOWVT: 7.5 V
TZAT-0020SLOWVT: 1 ms
TZON-0005SLOWVT: 6
TZST-0001SLOWVT: 2
TZST-0001SLOWVT: 4
TZST-0003SLOWVT: 36 J
VF: 0

## 2011-01-10 NOTE — Progress Notes (Signed)
HPI [name] returns today for followup. He is a 61 year old man with a nonischemic cardiomyopathy, atrial fibrillation, chronic systolic heart failure, status post ICD implantation. He has had multiple hospitalizations for congestive heart failure. He denies medical noncompliance and he denies dietary noncompliance. He has had no recent ICD shocks. No Known Allergies   Current Outpatient Prescriptions  Medication Sig Dispense Refill  . allopurinol (ZYLOPRIM) 100 MG tablet Take 100 mg by mouth daily.        Marland Kitchen amiodarone (PACERONE) 200 MG tablet Take 1 tablet (200 mg total) by mouth daily.  30 tablet  3  . carvedilol (COREG) 6.25 MG tablet Take 1 tablet (6.25 mg total) by mouth 2 (two) times daily with a meal.  60 tablet  3  . digoxin (LANOXIN) 0.25 MG tablet Take 250 mcg by mouth daily.        . furosemide (LASIX) 80 MG tablet Take 1 tablet (80 mg total) by mouth 2 (two) times daily.  60 tablet  3  . pantoprazole (PROTONIX) 40 MG tablet Take 40 mg by mouth daily.        . potassium chloride SA (K-DUR,KLOR-CON) 20 MEQ tablet Take 1 tablet (20 mEq total) by mouth 2 (two) times daily.  60 tablet  3  . rosuvastatin (CRESTOR) 5 MG tablet Take 5 mg by mouth daily.        . sucralfate (CARAFATE) 1 G tablet Take 1 g by mouth 4 (four) times daily.        Marland Kitchen warfarin (COUMADIN) 10 MG tablet Take 1 tablet (10 mg total) by mouth one time only at 6 PM.  35 tablet  3     Past Medical History  Diagnosis Date  . Hypertension   . CHF (congestive heart failure)   . Sarcoidosis   . Cardiomyopathy     non ischemic by cath  . Acute on chronic systolic heart failure   . Automatic implantable cardiac defibrillator in situ   . Atrial fibrillation   . NSVT (nonsustained ventricular tachycardia)   . Chronic kidney disease (CKD), stage III (moderate)   . GERD (gastroesophageal reflux disease)   . Hypercholesteremia     ROS:   All systems reviewed and negative except as noted in the HPI.   Past  Surgical History  Procedure Date  . Back surgery 1987    Ruptured disk repair     Family History  Problem Relation Age of Onset  . Heart disease    . Heart failure    . Stroke       History   Social History  . Marital Status: Divorced    Spouse Name: N/A    Number of Children: N/A  . Years of Education: N/A   Occupational History  . Not on file.   Social History Main Topics  . Smoking status: Never Smoker   . Smokeless tobacco: Not on file  . Alcohol Use: No  . Drug Use: No  . Sexually Active: Not Currently   Other Topics Concern  . Not on file   Social History Narrative  . No narrative on file     BP 120/88  Pulse 56  Ht 6\' 3"  (1.905 m)  Wt 106.051 kg (233 lb 12.8 oz)  BMI 29.22 kg/m2  Physical Exam:  Well appearing NAD HEENT: Unremarkable Neck:  No JVD, no thyromegally Lungs:  Clear with no wheezes, rales, or rhonchi. Well-healed ICD incision HEART:  IRegular rate rhythm, no murmurs, no  rubs, no clicks Abd:  soft, positive bowel sounds, no organomegally, no rebound, no guarding Ext:  2 plus pulses, no edema, no cyanosis, no clubbing Skin:  No rashes no nodules Neuro:  CN II through XII intact, motor grossly intact  DEVICE  Normal device function.  See PaceArt for details.   Assess/Plan:

## 2011-01-10 NOTE — Assessment & Plan Note (Signed)
His current symptoms are class 2-3. The easiest way to help his heart failure would be to return him to sinus rhythm. He is currently in atrial fibrillation. He will continue his current medical therapy for now, maintain a low-sodium diet, and we will increase his dose of amiodarone. My expectation is that we would attempt to cardiovert him once he has been therapeutic with his INR.

## 2011-01-10 NOTE — Patient Instructions (Signed)
Your physician recommends that you schedule a follow-up appointment in: 3 months with Dr Taylor  

## 2011-01-10 NOTE — Assessment & Plan Note (Signed)
His device is working normally. Plan to recheck in several months. He has had no recurrent ventricular arrhythmias.

## 2011-01-15 ENCOUNTER — Encounter (HOSPITAL_COMMUNITY): Payer: Self-pay | Admitting: *Deleted

## 2011-01-15 ENCOUNTER — Emergency Department (HOSPITAL_COMMUNITY): Payer: PRIVATE HEALTH INSURANCE

## 2011-01-15 ENCOUNTER — Other Ambulatory Visit: Payer: Self-pay

## 2011-01-15 ENCOUNTER — Inpatient Hospital Stay (HOSPITAL_COMMUNITY)
Admission: EM | Admit: 2011-01-15 | Discharge: 2011-01-18 | DRG: 292 | Disposition: A | Discharge status: Home / Self Care | Payer: PRIVATE HEALTH INSURANCE | Attending: Cardiology | Admitting: Cardiology

## 2011-01-15 DIAGNOSIS — D869 Sarcoidosis, unspecified: Secondary | ICD-10-CM | POA: Diagnosis present

## 2011-01-15 DIAGNOSIS — E739 Lactose intolerance, unspecified: Secondary | ICD-10-CM | POA: Diagnosis present

## 2011-01-15 DIAGNOSIS — Z95810 Presence of automatic (implantable) cardiac defibrillator: Secondary | ICD-10-CM

## 2011-01-15 DIAGNOSIS — I509 Heart failure, unspecified: Secondary | ICD-10-CM | POA: Diagnosis present

## 2011-01-15 DIAGNOSIS — K029 Dental caries, unspecified: Secondary | ICD-10-CM | POA: Diagnosis present

## 2011-01-15 DIAGNOSIS — I129 Hypertensive chronic kidney disease with stage 1 through stage 4 chronic kidney disease, or unspecified chronic kidney disease: Secondary | ICD-10-CM | POA: Diagnosis present

## 2011-01-15 DIAGNOSIS — K219 Gastro-esophageal reflux disease without esophagitis: Secondary | ICD-10-CM | POA: Diagnosis present

## 2011-01-15 DIAGNOSIS — I4891 Unspecified atrial fibrillation: Secondary | ICD-10-CM | POA: Diagnosis present

## 2011-01-15 DIAGNOSIS — I5023 Acute on chronic systolic (congestive) heart failure: Principal | ICD-10-CM | POA: Diagnosis present

## 2011-01-15 DIAGNOSIS — N184 Chronic kidney disease, stage 4 (severe): Secondary | ICD-10-CM | POA: Diagnosis present

## 2011-01-15 DIAGNOSIS — I428 Other cardiomyopathies: Secondary | ICD-10-CM | POA: Diagnosis present

## 2011-01-15 HISTORY — DX: Acute myocardial infarction, unspecified: I21.9

## 2011-01-15 HISTORY — DX: Atherosclerotic heart disease of native coronary artery without angina pectoris: I25.10

## 2011-01-15 LAB — CBC
HCT: 46.4 % (ref 39.0–52.0)
MCV: 88.7 fL (ref 78.0–100.0)
Platelets: 237 10*3/uL (ref 150–400)
RBC: 5.23 MIL/uL (ref 4.22–5.81)
RDW: 12.9 % (ref 11.5–15.5)
WBC: 4.8 10*3/uL (ref 4.0–10.5)

## 2011-01-15 LAB — CARDIAC PANEL(CRET KIN+CKTOT+MB+TROPI)
CK, MB: 4.1 ng/mL — ABNORMAL HIGH (ref 0.3–4.0)
Total CK: 156 U/L (ref 7–232)
Troponin I: <0.3 ng/mL (ref <0.30)

## 2011-01-15 LAB — COMPREHENSIVE METABOLIC PANEL
ALT: 29 U/L (ref 0–53)
AST: 35 U/L (ref 0–37)
CO2: 25 mEq/L (ref 19–32)
Chloride: 104 mEq/L (ref 96–112)
GFR calc Af Amer: 46 mL/min — ABNORMAL LOW (ref >90)
GFR calc non Af Amer: 39 mL/min — ABNORMAL LOW (ref >90)
Glucose, Bld: 104 mg/dL — ABNORMAL HIGH (ref 70–99)
Sodium: 140 mEq/L (ref 135–145)
Total Bilirubin: 0.3 mg/dL (ref 0.3–1.2)

## 2011-01-15 LAB — URINE MICROSCOPIC-ADD ON

## 2011-01-15 LAB — DIFFERENTIAL
Basophils Absolute: 0 10*3/uL (ref 0.0–0.1)
Lymphocytes Relative: 51 % — ABNORMAL HIGH (ref 12–46)
Lymphs Abs: 2.4 10*3/uL (ref 0.7–4.0)
Monocytes Absolute: 0.5 10*3/uL (ref 0.1–1.0)
Neutro Abs: 1.5 10*3/uL — ABNORMAL LOW (ref 1.7–7.7)

## 2011-01-15 LAB — URINALYSIS, ROUTINE W REFLEX MICROSCOPIC
Bilirubin Urine: NEGATIVE
Ketones, ur: NEGATIVE mg/dL
Nitrite: NEGATIVE
Protein, ur: 100 mg/dL — AB
Urobilinogen, UA: 0.2 mg/dL (ref 0.0–1.0)

## 2011-01-15 LAB — APTT: aPTT: 38 seconds — ABNORMAL HIGH (ref 24–37)

## 2011-01-15 LAB — URINE CULTURE: Culture: NO GROWTH

## 2011-01-15 MED ORDER — ASPIRIN 81 MG PO CHEW
162.0000 mg | CHEWABLE_TABLET | Freq: Once | ORAL | Status: AC
  Administered 2011-01-15: 162 mg via ORAL
  Filled 2011-01-15: qty 2

## 2011-01-15 MED ORDER — FUROSEMIDE 10 MG/ML IJ SOLN
80.0000 mg | Freq: Once | INTRAMUSCULAR | Status: AC
  Administered 2011-01-16: 80 mg via INTRAVENOUS
  Filled 2011-01-15: qty 8

## 2011-01-15 NOTE — ED Notes (Signed)
The pt has has some chest pain with sob and his heart has been racing since last pm with some lt arm pain also

## 2011-01-15 NOTE — ED Provider Notes (Signed)
History     CSN: [id number]  Arrival date & time 01/15/11  2031   First MD Initiated Contact with Patient 01/15/11 2201      Chief Complaint  Patient presents with  . Chest Pain    (Consider location/radiation/quality/duration/timing/severity/associated sxs/prior treatment) HPI History provided by pt.   Pt had intermittent, non-radiating, substernal chest heaviness w/ associated lightheadedness and worse than baseline SOB yesterday.  Aggravated by exertion.  Today the pain has been constant and increasingly more severe and is now associated w/ palpitations and LUE paresthesias as well.  Had a syncopal episode this evening that was witnessed by his mother.  Per prior chart, pt admitted to hospital for CHF exacerbation 12/5-12/11.  He is currently taking 120mg  lasix bid.    Past Medical History  Diagnosis Date  . Hypertension   . CHF (congestive heart failure)   . Sarcoidosis   . Cardiomyopathy     non ischemic by cath  . Acute on chronic systolic heart failure   . Automatic implantable cardiac defibrillator in situ   . Atrial fibrillation   . NSVT (nonsustained ventricular tachycardia)   . Chronic kidney disease (CKD), stage III (moderate)   . GERD (gastroesophageal reflux disease)   . Hypercholesteremia   . Coronary artery disease   . Myocardial infarction     Past Surgical History  Procedure Date  . Back surgery 1987    Ruptured disk repair    Family History  Problem Relation Age of Onset  . Heart disease    . Heart failure    . Stroke      History  Substance Use Topics  . Smoking status: Never Smoker   . Smokeless tobacco: Not on file  . Alcohol Use: No      Review of Systems  All other systems reviewed and are negative.    Allergies  Review of patient's allergies indicates no known allergies.  Home Medications   Current Outpatient Rx  Name Route Sig Dispense Refill  . ALLOPURINOL 100 MG PO TABS Oral Take 100 mg by mouth daily.      .  AMIODARONE HCL 200 MG PO TABS Oral Take 200 mg by mouth daily.    . ASPIRIN EC 81 MG PO TBEC Oral Take 81 mg by mouth daily.    Marland Kitchen CARVEDILOL 12.5 MG PO TABS Oral Take 12.5 mg by mouth 2 (two) times daily with a meal.    . DIGOXIN 0.25 MG PO TABS Oral Take 250 mcg by mouth daily.     . FUROSEMIDE 80 MG PO TABS Oral Take 80 mg by mouth 2 (two) times daily.    Marland Kitchen PANTOPRAZOLE SODIUM 40 MG PO TBEC Oral Take 40 mg by mouth daily.     Marland Kitchen POTASSIUM CHLORIDE CRYS ER 20 MEQ PO TBCR Oral Take 20 mEq by mouth 2 (two) times daily.    Marland Kitchen ROSUVASTATIN CALCIUM 5 MG PO TABS Oral Take 5 mg by mouth daily.     . SUCRALFATE 1 G PO TABS Oral Take 1 g by mouth 4 (four) times daily.     . WARFARIN SODIUM 10 MG PO TABS Oral Take 10 mg by mouth one time only at 6 PM.      BP 126/91  Pulse 53  Temp(Src) 98.3 F (36.8 C) (Oral)  Resp 16  SpO2 100%  Physical Exam  Nursing note and vitals reviewed. Constitutional: He is oriented to person, place, and time. He appears well-developed and well-nourished. No  distress.  HENT:  Head: Normocephalic and atraumatic.  Eyes:       Normal appearance  Neck: Normal range of motion.  Cardiovascular: Normal rate.        Irregular rhythm   Pulmonary/Chest: Effort normal and breath sounds normal.       Mild crackles at bilateral lung bases  Musculoskeletal:       No peripheral edema or calf ttp  Neurological: He is alert and oriented to person, place, and time.  Skin: Skin is warm and dry. No rash noted.  Psychiatric: He has a normal mood and affect. His behavior is normal.    ED Course  Procedures (including critical care time)  Labs Reviewed  DIFFERENTIAL - Abnormal; Notable for the following:    Neutrophils Relative 31 (*)    Neutro Abs 1.5 (*)    Lymphocytes Relative 51 (*)    Eosinophils Relative 6 (*)    All other components within normal limits  COMPREHENSIVE METABOLIC PANEL - Abnormal; Notable for the following:    Glucose, Bld 104 (*)    BUN 30 (*)     Creatinine, Ser 1.78 (*)    Albumin 3.2 (*)    GFR calc non Af Amer 39 (*)    GFR calc Af Amer 46 (*)    All other components within normal limits  URINALYSIS, ROUTINE W REFLEX MICROSCOPIC - Abnormal; Notable for the following:    Hgb urine dipstick TRACE (*)    Protein, ur 100 (*)    All other components within normal limits  CARDIAC PANEL(CRET KIN+CKTOT+MB+TROPI) - Abnormal; Notable for the following:    CK, MB 4.1 (*)    Relative Index 2.6 (*)    All other components within normal limits  PROTIME-INR - Abnormal; Notable for the following:    Prothrombin Time 20.5 (*)    INR 1.72 (*)    All other components within normal limits  APTT - Abnormal; Notable for the following:    aPTT 38 (*)    All other components within normal limits  PRO B NATRIURETIC PEPTIDE - Abnormal; Notable for the following:    Pro B Natriuretic peptide (BNP) 2490.0 (*)    All other components within normal limits  CBC  D-DIMER, QUANTITATIVE  URINE MICROSCOPIC-ADD ON  URINE CULTURE   Dg Chest Port 1 View  01/15/2011  *RADIOLOGY REPORT*  Clinical Data: Shortness of breath, CHF, chest pain  PORTABLE CHEST - 1 VIEW  Comparison: 12/11/2010  Findings: Stable cardiomegaly and left subclavian AICD.  Negative for CHF, pneumonia, effusion or pneumothorax.  Trachea midline.  IMPRESSION: Cardiomegaly without CHF or pneumonia.  Original Report Authenticated By: Judie Petit. Ruel Favors, M.D.     1. Congestive heart failure       MDM  Pt presents w/ c/o chest pain and worse than baseline SOB that is aggravated by exertion since yesterday.  Taking 120mg  po lasix bid.  Admitted for CHF exacerbation last month.  Afebrile, VS w/in nml limits, atrial fibrillation, no respiratory distress, mild crackles bilateral lung bases, no peripheral edema on exam.  CXR shows cardiomegaly w/out effusion.  BNP elevated at 2500 and labs otherwise unremarkable.  Consulted Dr. Algie Coffer and he will admit for CHF exacerbation.  80mg  IV lasix ordered  as well as 4mg  of morphine for CP.  Pt refuses nitroglycerin d/t SE profile.    Medical screening examination/treatment/procedure(s) were conducted as a shared visit with non-physician practitioner(s) and myself.  I personally evaluated the patient during the  encounter Pt is a 61 year old man with cardiomyopathy, has an AICD, and is on aggressive medical management for CHF.  He came in the a heaviness in his chest and shortness of breath.  Exam shows bibasilar rales.  Lab workup shows CHF with elevated BNP of 2490.  Admitted to Dr. [name], with Dr. Algie Coffer covering. Osvaldo Human, M.D.     Arie Sabina Mabie, PA 01/16/11 0045  Carleene Cooper III, MD 01/17/11 1318

## 2011-01-16 ENCOUNTER — Other Ambulatory Visit: Payer: Self-pay

## 2011-01-16 ENCOUNTER — Encounter (HOSPITAL_COMMUNITY): Payer: Self-pay | Admitting: *Deleted

## 2011-01-16 LAB — CARDIAC PANEL(CRET KIN+CKTOT+MB+TROPI)
Relative Index: 2.8 — ABNORMAL HIGH (ref 0.0–2.5)
Relative Index: 3 — ABNORMAL HIGH (ref 0.0–2.5)
Total CK: 120 U/L (ref 7–232)
Troponin I: <0.3 ng/mL (ref <0.30)

## 2011-01-16 MED ORDER — ALPRAZOLAM 0.25 MG PO TABS
0.2500 mg | ORAL_TABLET | Freq: Two times a day (BID) | ORAL | Status: DC | PRN
  Administered 2011-01-16: 0.25 mg via ORAL
  Filled 2011-01-16: qty 1

## 2011-01-16 MED ORDER — PANTOPRAZOLE SODIUM 40 MG PO TBEC
40.0000 mg | DELAYED_RELEASE_TABLET | Freq: Every day | ORAL | Status: DC
  Administered 2011-01-16 – 2011-01-18 (×3): 40 mg via ORAL
  Filled 2011-01-16 (×3): qty 1

## 2011-01-16 MED ORDER — CARVEDILOL 3.125 MG PO TABS
3.1250 mg | ORAL_TABLET | Freq: Two times a day (BID) | ORAL | Status: DC
  Administered 2011-01-16: 3.125 mg via ORAL
  Filled 2011-01-16 (×4): qty 1

## 2011-01-16 MED ORDER — ACETAMINOPHEN 325 MG PO TABS: 650.0000 mg | ORAL_TABLET | ORAL | Status: DC | PRN

## 2011-01-16 MED ORDER — FUROSEMIDE 10 MG/ML IJ SOLN
40.0000 mg | Freq: Two times a day (BID) | INTRAMUSCULAR | Status: DC
  Administered 2011-01-16: 40 mg via INTRAVENOUS
  Filled 2011-01-16 (×5): qty 4

## 2011-01-16 MED ORDER — SODIUM CHLORIDE 0.9 % IJ SOLN
3.0000 mL | Freq: Two times a day (BID) | INTRAMUSCULAR | Status: DC
  Administered 2011-01-16 – 2011-01-18 (×5): 3 mL via INTRAVENOUS

## 2011-01-16 MED ORDER — POTASSIUM CHLORIDE CRYS ER 10 MEQ PO TBCR
10.0000 meq | EXTENDED_RELEASE_TABLET | Freq: Every day | ORAL | Status: DC
  Administered 2011-01-17 – 2011-01-18 (×2): 10 meq via ORAL
  Filled 2011-01-16 (×3): qty 1

## 2011-01-16 MED ORDER — CARVEDILOL 6.25 MG PO TABS
6.2500 mg | ORAL_TABLET | Freq: Two times a day (BID) | ORAL | Status: DC
  Filled 2011-01-16 (×3): qty 1

## 2011-01-16 MED ORDER — DIGOXIN 125 MCG PO TABS
0.1250 mg | ORAL_TABLET | Freq: Every day | ORAL | Status: DC
  Administered 2011-01-17: 0.125 mg via ORAL
  Filled 2011-01-16 (×3): qty 1

## 2011-01-16 MED ORDER — SODIUM CHLORIDE 0.9 % IV SOLN: 250.0000 mL | INTRAVENOUS | Status: DC | PRN

## 2011-01-16 MED ORDER — HEPARIN SOD (PORCINE) IN D5W 100 UNIT/ML IV SOLN
1650.0000 [IU]/h | INTRAVENOUS | Status: DC
  Administered 2011-01-16: 1650 [IU]/h via INTRAVENOUS
  Administered 2011-01-16: 1400 [IU]/h via INTRAVENOUS
  Administered 2011-01-17: 1650 [IU]/h via INTRAVENOUS
  Filled 2011-01-16 (×3): qty 250

## 2011-01-16 MED ORDER — WARFARIN SODIUM 7.5 MG PO TABS
15.0000 mg | ORAL_TABLET | Freq: Once | ORAL | Status: AC
  Administered 2011-01-16: 15 mg via ORAL
  Filled 2011-01-16 (×2): qty 2

## 2011-01-16 MED ORDER — ONDANSETRON HCL 4 MG/2ML IJ SOLN
4.0000 mg | Freq: Four times a day (QID) | INTRAMUSCULAR | Status: DC | PRN
  Administered 2011-01-16: 4 mg via INTRAVENOUS
  Filled 2011-01-16: qty 2

## 2011-01-16 MED ORDER — MORPHINE SULFATE 4 MG/ML IJ SOLN
4.0000 mg | Freq: Once | INTRAMUSCULAR | Status: AC
  Administered 2011-01-16: 4 mg via INTRAVENOUS
  Filled 2011-01-16: qty 1

## 2011-01-16 MED ORDER — SODIUM CHLORIDE 0.9 % IJ SOLN: 3.0000 mL | INTRAMUSCULAR | Status: DC | PRN

## 2011-01-16 MED ORDER — HYDROCODONE-ACETAMINOPHEN 5-325 MG PO TABS
1.0000 | ORAL_TABLET | Freq: Four times a day (QID) | ORAL | Status: DC | PRN
  Administered 2011-01-16 – 2011-01-18 (×4): 2 via ORAL
  Filled 2011-01-16 (×4): qty 2

## 2011-01-16 MED ORDER — ALLOPURINOL 100 MG PO TABS
100.0000 mg | ORAL_TABLET | Freq: Every day | ORAL | Status: DC
  Administered 2011-01-17 – 2011-01-18 (×2): 100 mg via ORAL
  Filled 2011-01-16 (×3): qty 1

## 2011-01-16 MED ORDER — HEPARIN BOLUS VIA INFUSION
1500.0000 [IU] | Freq: Once | INTRAVENOUS | Status: AC
  Administered 2011-01-16: 1500 [IU] via INTRAVENOUS
  Filled 2011-01-16: qty 1500

## 2011-01-16 MED ORDER — AMIODARONE HCL 200 MG PO TABS
200.0000 mg | ORAL_TABLET | Freq: Every day | ORAL | Status: DC
  Administered 2011-01-17 – 2011-01-18 (×2): 200 mg via ORAL
  Filled 2011-01-16 (×3): qty 1

## 2011-01-16 MED ORDER — ISOSORB DINITRATE-HYDRALAZINE 20-37.5 MG PO TABS
1.0000 | ORAL_TABLET | Freq: Two times a day (BID) | ORAL | Status: DC
  Administered 2011-01-16 – 2011-01-18 (×4): 1 via ORAL
  Filled 2011-01-16 (×6): qty 1

## 2011-01-16 MED ORDER — ROSUVASTATIN CALCIUM 5 MG PO TABS
5.0000 mg | ORAL_TABLET | Freq: Every day | ORAL | Status: DC
  Administered 2011-01-16 – 2011-01-17 (×2): 5 mg via ORAL
  Filled 2011-01-16 (×3): qty 1

## 2011-01-16 NOTE — Progress Notes (Signed)
Patient arrived to unit from ED via stretcher. Patient is alert & oriented. Patient's vital signs, initial assessment, chf education, unit information done-see charting.  Patient afib on monitor. Call bell in reach, bed in low position. Will cont to monitor. awalker,rn

## 2011-01-16 NOTE — Progress Notes (Signed)
Patient's heart rate now sustaining in 40's-50's with "occasional" pacer spikes even though patient has an ICD which is to pace on demand.  Patient is awake, alert and asymptomatic. Vital signs taken, 97.4, 18, 103/69, 60, 93% on room air. Dr Algie Coffer on call and notified. Received order to hold this morning meds until md can assess icd/pacer. Will continue to monitor. awalker,rn

## 2011-01-16 NOTE — Progress Notes (Signed)
Subjective:  Complains of feeling tired fatigued with no energy noted to be in A. fib with slow ventricular response ICD reprogrammed Dr. Algie Coffer discussed with patient regarding TEE cardioversion His INR remains subtherapeutic  Objective:  Vital Signs in the last 24 hours: Temp:  [97.8 F (36.6 C)-98.3 F (36.8 C)] 98.3 F (36.8 C) (01/14 0248) Pulse Rate:  [42-101] 101  (01/14 1027) Resp:  [15-23] 18  (01/14 1027) BP: (103-143)/(69-98) 103/69 mmHg (01/14 1027) SpO2:  [93 %-100 %] 93 % (01/14 0248) Weight:  [101.263 kg (223 lb 3.9 oz)] 101.263 kg (223 lb 3.9 oz) (01/14 0248)  Intake/Output from previous day: 01/13 0701 - 01/14 0700 In: 240 [P.O.:240] Out: 925 [Urine:925] Intake/Output from this shift: Total I/O In: 480 [P.O.:480] Out: 500 [Urine:500]  Physical Exam: General appearance: alert and cooperative Neck: no carotid bruit, no JVD and supple, symmetrical, trachea midline Lungs: Decreased breath sounds at bases Heart: irregularly irregular rhythm and S1-S2 soft there is soft systolic murmur and S3 gallop Abdomen: soft, non-tender; bowel sounds normal; no masses,  no organomegaly Extremities: extremities normal, atraumatic, no cyanosis or edema  Lab Results:  [facility] 01/15/11 2156  WBC 4.8  HGB 16.2  PLT 237    Basename 01/15/11 2156  NA 140  K 4.2  CL 104  CO2 25  GLUCOSE 104*  BUN 30*  CREATININE 1.78*    Basename 01/16/11 0852 01/15/11 2156  TROPONINI <0.30 <0.30   Hepatic Function Panel  Basename 01/15/11 2156  PROT 7.9  ALBUMIN 3.2*  AST 35  ALT 29  ALKPHOS 60  BILITOT 0.3  BILIDIR --  IBILI --   No results found for this basename: CHOL in the last 72 hours No results found for this basename: PROTIME in the last 72 hours  Imaging:   Cardiac Studies:  Assessment/Plan:  Resolving acute on chronic mild systolic heart failure Nonischemic dilated cardiomyopathy Hypertension Chronic atrial fibrillation History of nonsustained VT  in the past Glucose intolerance Chronic kidney disease stage IV History of sarcoidosis GERD Plan Continue present management Restart heparin per pharmacy protocol with no bolus  LOS: 1 day    Daphane Odekirk N 01/16/2011, 2:04 PM

## 2011-01-16 NOTE — H&P (Signed)
[name] is an 61 y.o. male.   Chief Complaint: CP and shortness of breath.  HPI: 61 years old black male with 1 week history of exertional shortness of breath and 1 day history of substernal chest pain. No fever or cough and cold. Had recent follow up by [name] for ICD check-up.  Past Medical History  Diagnosis Date  . Hypertension   . CHF (congestive heart failure)   . Sarcoidosis   . Cardiomyopathy     non ischemic by cath  . Acute on chronic systolic heart failure   . Automatic implantable cardiac defibrillator in situ   . Atrial fibrillation   . NSVT (nonsustained ventricular tachycardia)   . Chronic kidney disease (CKD), stage III (moderate)   . GERD (gastroesophageal reflux disease)   . Hypercholesteremia   . Coronary artery disease   . Myocardial infarction       Past Surgical History  Procedure Date  . Back surgery 1987    Ruptured disk repair    Family History  Problem Relation Age of Onset  . Heart disease    . Heart failure    . Stroke     Social History:  reports that he has never smoked. He does not have any smokeless tobacco history on file. He reports that he does not drink alcohol or use illicit drugs.  Allergies: No Known Allergies  Medications Prior to Admission  Medication Dose Route Frequency Provider Last Rate Last Dose  . aspirin chewable tablet 162 mg  162 mg Oral Once Otilio Miu, PA   162 mg at 01/15/11 2231  . furosemide (LASIX) injection 80 mg  80 mg Intravenous Once Otilio Miu, PA   80 mg at 01/16/11 0007  . morphine 4 MG/ML injection 4 mg  4 mg Intravenous Once Otilio Miu, PA   4 mg at 01/16/11 0023   Medications Prior to Admission  Medication Sig Dispense Refill  . allopurinol (ZYLOPRIM) 100 MG tablet Take 100 mg by mouth daily.        Marland Kitchen amiodarone (PACERONE) 200 MG tablet Take 200 mg by mouth daily.      . digoxin (LANOXIN) 0.25 MG tablet Take 250 mcg by mouth daily.       . furosemide  (LASIX) 80 MG tablet Take 80 mg by mouth 2 (two) times daily.      . pantoprazole (PROTONIX) 40 MG tablet Take 40 mg by mouth daily.       . potassium chloride SA (K-DUR,KLOR-CON) 20 MEQ tablet Take 20 mEq by mouth 2 (two) times daily.      . rosuvastatin (CRESTOR) 5 MG tablet Take 5 mg by mouth daily.       . sucralfate (CARAFATE) 1 G tablet Take 1 g by mouth 4 (four) times daily.       Marland Kitchen warfarin (COUMADIN) 10 MG tablet Take 10 mg by mouth one time only at 6 PM.        Results for orders placed during the hospital encounter of 01/15/11 (from the past 48 hour(s))  CBC     Status: Normal   Collection Time   01/15/11  9:56 PM      Component Value Range Comment   WBC 4.8  4.0 - 10.5 (K/uL)    RBC 5.23  4.22 - 5.81 (MIL/uL)    Hemoglobin 16.2  13.0 - 17.0 (g/dL)    HCT 16.1  09.6 - 04.5 (%)    MCV  88.7  78.0 - 100.0 (fL)    MCH 31.0  26.0 - 34.0 (pg)    MCHC 34.9  30.0 - 36.0 (g/dL)    RDW 21.3  08.6 - 57.8 (%)    Platelets 237  150 - 400 (K/uL)   DIFFERENTIAL     Status: Abnormal   Collection Time   01/15/11  9:56 PM      Component Value Range Comment   Neutrophils Relative 31 (*) 43 - 77 (%)    Neutro Abs 1.5 (*) 1.7 - 7.7 (K/uL)    Lymphocytes Relative 51 (*) 12 - 46 (%)    Lymphs Abs 2.4  0.7 - 4.0 (K/uL)    Monocytes Relative 11  3 - 12 (%)    Monocytes Absolute 0.5  0.1 - 1.0 (K/uL)    Eosinophils Relative 6 (*) 0 - 5 (%)    Eosinophils Absolute 0.3  0.0 - 0.7 (K/uL)    Basophils Relative 1  0 - 1 (%)    Basophils Absolute 0.0  0.0 - 0.1 (K/uL)   COMPREHENSIVE METABOLIC PANEL     Status: Abnormal   Collection Time   01/15/11  9:56 PM      Component Value Range Comment   Sodium 140  135 - 145 (mEq/L)    Potassium 4.2  3.5 - 5.1 (mEq/L)    Chloride 104  96 - 112 (mEq/L)    CO2 25  19 - 32 (mEq/L)    Glucose, Bld 104 (*) 70 - 99 (mg/dL)    BUN 30 (*) 6 - 23 (mg/dL)    Creatinine, Ser 4.69 (*) 0.50 - 1.35 (mg/dL)    Calcium 62.9  8.4 - 10.5 (mg/dL)    Total Protein 7.9   6.0 - 8.3 (g/dL)    Albumin 3.2 (*) 3.5 - 5.2 (g/dL)    AST 35  0 - 37 (U/L) HEMOLYSIS AT THIS LEVEL MAY AFFECT RESULT   ALT 29  0 - 53 (U/L)    Alkaline Phosphatase 60  39 - 117 (U/L)    Total Bilirubin 0.3  0.3 - 1.2 (mg/dL)    GFR calc non Af Amer 39 (*) >90 (mL/min)    GFR calc Af Amer 46 (*) >90 (mL/min)   CARDIAC PANEL(CRET KIN+CKTOT+MB+TROPI)     Status: Abnormal   Collection Time   01/15/11  9:56 PM      Component Value Range Comment   Total CK 156  7 - 232 (U/L)    CK, MB 4.1 (*) 0.3 - 4.0 (ng/mL)    Troponin I <0.30  <0.30 (ng/mL)    Relative Index 2.6 (*) 0.0 - 2.5    PROTIME-INR     Status: Abnormal   Collection Time   01/15/11  9:56 PM      Component Value Range Comment   Prothrombin Time 20.5 (*) 11.6 - 15.2 (seconds)    INR 1.72 (*) 0.00 - 1.49    APTT     Status: Abnormal   Collection Time   01/15/11  9:56 PM      Component Value Range Comment   aPTT 38 (*) 24 - 37 (seconds)   D-DIMER, QUANTITATIVE     Status: Normal   Collection Time   01/15/11  9:56 PM      Component Value Range Comment   D-Dimer, Quant 0.26  0.00 - 0.48 (ug/mL-FEU)   PRO B NATRIURETIC PEPTIDE     Status: Abnormal   Collection Time  01/15/11  9:56 PM      Component Value Range Comment   Pro B Natriuretic peptide (BNP) 2490.0 (*) 0 - 125 (pg/mL)   URINALYSIS, ROUTINE W REFLEX MICROSCOPIC     Status: Abnormal   Collection Time   01/15/11 10:01 PM      Component Value Range Comment   Color, Urine YELLOW  YELLOW     APPearance CLEAR  CLEAR     Specific Gravity, Urine 1.008  1.005 - 1.030     pH 5.5  5.0 - 8.0     Glucose, UA NEGATIVE  NEGATIVE (mg/dL)    Hgb urine dipstick TRACE (*) NEGATIVE     Bilirubin Urine NEGATIVE  NEGATIVE     Ketones, ur NEGATIVE  NEGATIVE (mg/dL)    Protein, ur 962 (*) NEGATIVE (mg/dL)    Urobilinogen, UA 0.2  0.0 - 1.0 (mg/dL)    Nitrite NEGATIVE  NEGATIVE     Leukocytes, UA NEGATIVE  NEGATIVE    URINE MICROSCOPIC-ADD ON     Status: Normal   Collection Time    01/15/11 10:01 PM      Component Value Range Comment   Squamous Epithelial / LPF RARE  RARE     WBC, UA 0-2  <3 (WBC/hpf)    RBC / HPF 0-2  <3 (RBC/hpf)    Bacteria, UA RARE  RARE     Dg Chest Port 1 View  01/15/2011  *RADIOLOGY REPORT*  Clinical Data: Shortness of breath, CHF, chest pain  PORTABLE CHEST - 1 VIEW  Comparison: 12/11/2010  Findings: Stable cardiomegaly and left subclavian AICD.  Negative for CHF, pneumonia, effusion or pneumothorax.  Trachea midline.  IMPRESSION: Cardiomegaly without CHF or pneumonia.  Original Report Authenticated By: Judie Petit. Ruel Favors, M.D.    @ROS @ Wears reading glasses, No hearing loss, No weight gain or loss, + chest pain, + congestive heart failure, + dilated cardiomyopathy, + shortness of breath, + Sarcoidosis, No hepatitis, No renal stone, No CVA, No joint pain, No rash.  Blood pressure 138/87, pulse 65, temperature 97.8 F (36.6 C), temperature source Oral, resp. rate 18, SpO2 100.00%. Constitutional: He is oriented to person, place, and time. He appears well-developed and well-nourished. No distress.  HENT: Head: Normocephalic and atraumatic, Brown eyes, Conj- pink, Sclera-non-icteric.  Neck: Normal range of motion. + JVD at 30 degree angle. Cardiovascular: Irregular rhythm, normal S1, S2. III/VI systolic murmur. Pulmonary/Chest: Effort normal and breath sounds normal. Rare basal crackles. Abdomen: Soft and non-tender. Ext. : No peripheral edema or calf tenderness on palpation  Neurological: He is alert and oriented to person, place, and time.  Skin: Skin is warm and dry. No rash noted.  Psychiatric: He has a normal mood and affect. His behavior is normal.    Assessment/Plan Acute on chronic left heart systolic murmur. Chest pain Dilated cardiomyopathy with ICD Sarcoidosis  Admit to telebed, IV lasix, home medication, O2, 2-D echo.  Vincenzina Jagoda S 01/16/2011, 12:58 AM

## 2011-01-16 NOTE — Progress Notes (Signed)
PHARMACY - ANTICOAGULATION CONSULT INITIAL NOTE for heparin  Pharmacy Consult for: Heparin (patient on coumadin with subtherapeutic INR)  Indication: atrial fibrillation    Patient Data:   Allergies: No Known Allergies  Patient Measurements: Height: 6\' 3"  (190.5 cm) Weight: 223 lb 3.9 oz (101.263 kg) (scale c) IBW/kg (Calculated) : 84.5    Vital Signs: Temp:  [97.8 F (36.6 C)-98.3 F (36.8 C)] 98.3 F (36.8 C) (01/14 0248) Pulse Rate:  [42-101] 101  (01/14 1027) Resp:  [15-23] 18  (01/14 1027) BP: (103-143)/(69-98) 103/69 mmHg (01/14 1027) SpO2:  [93 %-100 %] 93 % (01/14 0248) Weight:  [223 lb 3.9 oz (101.263 kg)] 223 lb 3.9 oz (101.263 kg) (01/14 0248)  Intake/Output from previous day:  Intake/Output Summary (Last 24 hours) at 01/16/11 1426 Last data filed at 01/16/11 1320  Gross per 24 hour  Intake    720 ml  Output   1425 ml  Net   -705 ml    Labs:  Basename 01/16/11 0852 01/15/11 2156  HGB -- 16.2  HCT -- 46.4  PLT -- 237  APTT -- 38*  LABPROT -- 20.5*  INR -- 1.72*  HEPARINUNFRC -- --  CREATININE -- 1.78*  CKTOTAL 121 156  CKMB 3.4 4.1*  TROPONINI <0.30 <0.30   Estimated Creatinine Clearance: 52.1 ml/min (by C-G formula based on Cr of 1.78).  Medical History: Past Medical History  Diagnosis Date  . Hypertension   . CHF (congestive heart failure)   . Sarcoidosis   . Cardiomyopathy     non ischemic by cath  . Acute on chronic systolic heart failure   . Automatic implantable cardiac defibrillator in situ   . Atrial fibrillation   . NSVT (nonsustained ventricular tachycardia)   . Chronic kidney disease (CKD), stage III (moderate)   . GERD (gastroesophageal reflux disease)   . Hypercholesteremia   . Coronary artery disease   . Myocardial infarction   . Shortness of breath     Scheduled medications:     . allopurinol  100 mg Oral Daily  . amiodarone  200 mg Oral Daily  . aspirin  162 mg Oral Once  . carvedilol  3.125 mg Oral BID WC  .  digoxin  0.125 mg Oral Daily  . furosemide  40 mg Intravenous Q12H  . furosemide  80 mg Intravenous Once  . isosorbide-hydrALAZINE  1 tablet Oral BID  .  morphine injection  4 mg Intravenous Once  . pantoprazole  40 mg Oral Q1200  . potassium chloride  10 mEq Oral Daily  . rosuvastatin  5 mg Oral Q2000  . sodium chloride  3 mL Intravenous Q12H  . warfarin  15 mg Oral ONCE-1800  . DISCONTD: carvedilol  6.25 mg Oral BID WC     Assessment:  61 y.o. male with h/o atrial fibrillation admitted on 01/15/2011 with chest pain and shortness of breath. Pharmacy consulted to manage Heparin, pharmacy alreadly dosing coumadin.   Goal of Therapy:  1. Heparin level= 0.3-0.7 2. PT/INR=2-3  Plan: Start heparin drip at 1400 units/hr. (no bolus) Check heparin level 6 hours after drip start. Continue coumadin 15mg  po today at 1800. Daily heparin level/CBC, PT/INR.  Wendie Simmer, PharmD, BCPS Clinical Pharmacist  Pager: [phone]

## 2011-01-16 NOTE — Progress Notes (Signed)
PHARMACY - ANTICOAGULATION CONSULT INITIAL NOTE  Pharmacy Consult for: Coumadin  Indication: atrial fibrillation    Patient Data:   Allergies: No Known Allergies  Patient Measurements:   Adjusted Body Weight:   Vital Signs: Temp:  [97.8 F (36.6 C)-98.3 F (36.8 C)] 97.8 F (36.6 C) (01/14 0019) Pulse Rate:  [42-65] 58  (01/14 0130) Resp:  [15-23] 15  (01/14 0130) BP: (126-143)/(82-98) 126/83 mmHg (01/14 0130) SpO2:  [95 %-100 %] 99 % (01/14 0130)  Intake/Output from previous day:  Intake/Output Summary (Last 24 hours) at 01/16/11 0233 Last data filed at 01/16/11 0015  Gross per 24 hour  Intake      0 ml  Output    500 ml  Net   -500 ml    Labs:  [facility] 01/15/11 2156  HGB 16.2  HCT 46.4  PLT 237  APTT 38*  LABPROT 20.5*  INR 1.72*  HEPARINUNFRC --  CREATININE 1.78*  CKTOTAL 156  CKMB 4.1*  TROPONINI <0.30   The CrCl is unknown because both a height and weight (above a minimum accepted value) are required for this calculation.  Medical History: Past Medical History  Diagnosis Date  . Hypertension   . CHF (congestive heart failure)   . Sarcoidosis   . Cardiomyopathy     non ischemic by cath  . Acute on chronic systolic heart failure   . Automatic implantable cardiac defibrillator in situ   . Atrial fibrillation   . NSVT (nonsustained ventricular tachycardia)   . Chronic kidney disease (CKD), stage III (moderate)   . GERD (gastroesophageal reflux disease)   . Hypercholesteremia   . Coronary artery disease   . Myocardial infarction     Scheduled medications:     . allopurinol  100 mg Oral Daily  . amiodarone  200 mg Oral Daily  . aspirin  162 mg Oral Once  . carvedilol  6.25 mg Oral Q12H  . digoxin  0.125 mg Oral Daily  . furosemide  40 mg Intravenous Q12H  . furosemide  80 mg Intravenous Once  . isosorbide-hydrALAZINE  1 tablet Oral BID  .  morphine injection  4 mg Intravenous Once  . pantoprazole  40 mg Oral Q1200  . potassium  chloride  10 mEq Oral Daily  . rosuvastatin  5 mg Oral Q2000  . sodium chloride  3 mL Intravenous Q12H     Assessment:  61 y.o. male with h/o atrial fibrillation admitted on 01/15/2011 with chest pain and shortness of breath. Pharmacy consulted to manage Coumadin. Home Coumadin regimen of 10mg  po daily. INR is below-goal.  Goal of Therapy:  1. INR 2-3  Plan:  1. Coumadin 15mg  po today at 18:00. 2. Daily PT / INR starting 01/17/11.   Dineen Kid Thad Ranger, PharmD 01/16/2011, 2:33 AM

## 2011-01-16 NOTE — Progress Notes (Signed)
PHARMACY - ANTICOAGULATION CONSULT FOLLOW UP (HEPARIN LEVEL)  Pharmacy Consult for: Heparin (patient on coumadin with subtherapeutic INR)  Indication: atrial fibrillation    Patient Data:   Allergies: No Known Allergies  Patient Measurements: Height: 6\' 3"  (190.5 cm) Weight: 223 lb 3.9 oz (101.263 kg) (scale c) IBW/kg (Calculated) : 84.5    Vital Signs: Temp:  [97.6 F (36.4 C)-98.3 F (36.8 C)] 97.8 F (36.6 C) (01/14 2107) Pulse Rate:  [42-101] 75  (01/14 2107) Resp:  [15-18] 18  (01/14 2107) BP: (103-143)/(66-98) 111/66 mmHg (01/14 2107) SpO2:  [93 %-100 %] 97 % (01/14 2107) Weight:  [223 lb 3.9 oz (101.263 kg)] 223 lb 3.9 oz (101.263 kg) (01/14 0248)  Intake/Output from previous day:  Intake/Output Summary (Last 24 hours) at 01/16/11 2203 Last data filed at 01/16/11 2133  Gross per 24 hour  Intake   1118 ml  Output   2150 ml  Net  -1032 ml    Labs:  Basename 01/16/11 2101 01/16/11 1542 01/16/11 0852 01/15/11 2156  HGB -- -- -- 16.2  HCT -- -- -- 46.4  PLT -- -- -- 237  APTT -- -- -- 38*  LABPROT -- -- -- 20.5*  INR -- -- -- 1.72*  HEPARINUNFRC 0.19* -- -- --  CREATININE -- -- -- 1.78*  CKTOTAL -- 120 121 156  CKMB -- 3.6 3.4 4.1*  TROPONINI -- <0.30 <0.30 <0.30   Estimated Creatinine Clearance: 52.1 ml/min (by C-G formula based on Cr of 1.78).  Medical History: Past Medical History  Diagnosis Date  . Hypertension   . CHF (congestive heart failure)   . Sarcoidosis   . Cardiomyopathy     non ischemic by cath  . Acute on chronic systolic heart failure   . Automatic implantable cardiac defibrillator in situ   . Atrial fibrillation   . NSVT (nonsustained ventricular tachycardia)   . Chronic kidney disease (CKD), stage III (moderate)   . GERD (gastroesophageal reflux disease)   . Hypercholesteremia   . Coronary artery disease   . Myocardial infarction   . Shortness of breath     Scheduled medications:     . allopurinol  100 mg Oral Daily  .  amiodarone  200 mg Oral Daily  . aspirin  162 mg Oral Once  . carvedilol  3.125 mg Oral BID WC  . digoxin  0.125 mg Oral Daily  . furosemide  40 mg Intravenous Q12H  . furosemide  80 mg Intravenous Once  . isosorbide-hydrALAZINE  1 tablet Oral BID  .  morphine injection  4 mg Intravenous Once  . pantoprazole  40 mg Oral Q1200  . potassium chloride  10 mEq Oral Daily  . rosuvastatin  5 mg Oral Q2000  . sodium chloride  3 mL Intravenous Q12H  . warfarin  15 mg Oral ONCE-1800  . DISCONTD: carvedilol  6.25 mg Oral BID WC     Assessment:  61 y.o. male with h/o atrial fibrillation admitted on 01/15/2011 with chest pain and shortness of breath. On Coumadin prior to admission.  Heparin IV drip started today @1400  units/hr  The 6 hr Heparin level =0.19 is sub-therapeutic.  No bleeding reported.   Goal of Therapy:  1. Heparin level= 0.3-0.7 2.  INR =2-3  Plan:  Give bolus IV heparin 1500 units x1 now and increase heparin drip to 1650 units/hr. Check heparin level 6 hours after rate increase. Continue daily INR, Heparin level and CBC.   Arman Filter, RPh Clinical  Pharmacist  Pager: [phone]

## 2011-01-16 NOTE — Progress Notes (Signed)
DR Algie Coffer wanted AM meds held until pt's pacemaker is interrogated  [employer] production planner

## 2011-01-16 NOTE — Progress Notes (Signed)
*  PRELIMINARY RESULTS* Echocardiogram 2D Echocardiogram has been performed.  [name] 01/16/2011, 4:11 PM 

## 2011-01-16 NOTE — Plan of Care (Signed)
Problem: Consults Goal: Heart Failure Patient Education (See Patient Education module for education specifics.) Outcome: Completed/Met Date Met:  01/16/11 Patient has frequent admits. Patient is very knowledgeable of chf, daily weights, food/diet, when to contact md, etc.

## 2011-01-17 ENCOUNTER — Encounter (HOSPITAL_COMMUNITY)
Admission: EM | Disposition: A | Discharge status: Home / Self Care | Payer: Self-pay | Source: Home / Self Care | Attending: Cardiology

## 2011-01-17 ENCOUNTER — Encounter (HOSPITAL_COMMUNITY): Payer: Self-pay | Admitting: *Deleted

## 2011-01-17 ENCOUNTER — Inpatient Hospital Stay (HOSPITAL_COMMUNITY): Payer: PRIVATE HEALTH INSURANCE | Admitting: Certified Registered"

## 2011-01-17 ENCOUNTER — Inpatient Hospital Stay (HOSPITAL_COMMUNITY): Payer: PRIVATE HEALTH INSURANCE

## 2011-01-17 ENCOUNTER — Other Ambulatory Visit: Payer: Self-pay

## 2011-01-17 ENCOUNTER — Encounter (HOSPITAL_COMMUNITY): Payer: Self-pay | Admitting: Certified Registered"

## 2011-01-17 HISTORY — PX: CARDIOVERSION: SHX1299

## 2011-01-17 HISTORY — PX: TEE WITHOUT CARDIOVERSION: SHX5443

## 2011-01-17 LAB — CBC
HCT: 45.5 % (ref 39.0–52.0)
Hemoglobin: 16 g/dL (ref 13.0–17.0)
MCHC: 35.2 g/dL (ref 30.0–36.0)
WBC: 6 10*3/uL (ref 4.0–10.5)

## 2011-01-17 LAB — CARDIAC PANEL(CRET KIN+CKTOT+MB+TROPI)
CK, MB: 3.2 ng/mL (ref 0.3–4.0)
Relative Index: 3.2 — ABNORMAL HIGH (ref 0.0–2.5)
Total CK: 100 U/L (ref 7–232)
Troponin I: <0.3 ng/mL (ref <0.30)

## 2011-01-17 LAB — HEPARIN LEVEL (UNFRACTIONATED): Heparin Unfractionated: 1.04 IU/mL — ABNORMAL HIGH (ref 0.30–0.70)

## 2011-01-17 LAB — BASIC METABOLIC PANEL
CO2: 28 mEq/L (ref 19–32)
Calcium: 10.2 mg/dL (ref 8.4–10.5)
Glucose, Bld: 104 mg/dL — ABNORMAL HIGH (ref 70–99)
Sodium: 139 mEq/L (ref 135–145)

## 2011-01-17 LAB — PROTIME-INR: INR: 1.71 — ABNORMAL HIGH (ref 0.00–1.49)

## 2011-01-17 SURGERY — ECHOCARDIOGRAM, TRANSESOPHAGEAL
Anesthesia: Moderate Sedation

## 2011-01-17 MED ORDER — MIDAZOLAM HCL 10 MG/2ML IJ SOLN: 10.0000 mg | Freq: Once | INTRAMUSCULAR | Status: DC

## 2011-01-17 MED ORDER — FENTANYL CITRATE 0.05 MG/ML IJ SOLN
INTRAMUSCULAR | Status: DC | PRN
  Administered 2011-01-17 (×2): 25 ug via INTRAVENOUS

## 2011-01-17 MED ORDER — FENTANYL CITRATE 0.05 MG/ML IJ SOLN: 250.0000 ug | Freq: Once | INTRAMUSCULAR | Status: DC

## 2011-01-17 MED ORDER — SODIUM CHLORIDE 0.9 % IV SOLN: 250.0000 mL | INTRAVENOUS | Status: DC | PRN

## 2011-01-17 MED ORDER — CARVEDILOL 3.125 MG PO TABS
3.1250 mg | ORAL_TABLET | Freq: Two times a day (BID) | ORAL | Status: DC
  Administered 2011-01-18: 3.125 mg via ORAL
  Filled 2011-01-17 (×3): qty 1

## 2011-01-17 MED ORDER — HEPARIN SOD (PORCINE) IN D5W 100 UNIT/ML IV SOLN
1450.0000 [IU]/h | INTRAVENOUS | Status: DC
  Filled 2011-01-17 (×2): qty 250

## 2011-01-17 MED ORDER — PENICILLIN V POTASSIUM 500 MG PO TABS
500.0000 mg | ORAL_TABLET | Freq: Four times a day (QID) | ORAL | Status: DC
  Filled 2011-01-17 (×2): qty 1

## 2011-01-17 MED ORDER — FENTANYL CITRATE 0.05 MG/ML IJ SOLN
INTRAMUSCULAR | Status: AC
  Filled 2011-01-17: qty 2

## 2011-01-17 MED ORDER — SODIUM CHLORIDE 0.9 % IJ SOLN: 3.0000 mL | INTRAMUSCULAR | Status: DC | PRN

## 2011-01-17 MED ORDER — MIDAZOLAM HCL 10 MG/2ML IJ SOLN
INTRAMUSCULAR | Status: DC | PRN
  Administered 2011-01-17: 1 mg via INTRAVENOUS

## 2011-01-17 MED ORDER — PROPOFOL 10 MG/ML IV EMUL
INTRAVENOUS | Status: DC | PRN
  Administered 2011-01-17 (×2): 50 mg via INTRAVENOUS

## 2011-01-17 MED ORDER — SODIUM CHLORIDE 0.45 % IV SOLN: INTRAVENOUS | Status: DC

## 2011-01-17 MED ORDER — BUTAMBEN-TETRACAINE-BENZOCAINE 2-2-14 % EX AERO
INHALATION_SPRAY | CUTANEOUS | Status: DC | PRN
  Administered 2011-01-17: 2 via TOPICAL

## 2011-01-17 MED ORDER — BENZOCAINE 20 % MT SOLN
1.0000 "application " | OROMUCOSAL | Status: DC | PRN
  Filled 2011-01-17: qty 57

## 2011-01-17 MED ORDER — FUROSEMIDE 10 MG/ML IJ SOLN
40.0000 mg | Freq: Two times a day (BID) | INTRAMUSCULAR | Status: DC
  Administered 2011-01-18: 40 mg via INTRAVENOUS
  Filled 2011-01-17 (×3): qty 4

## 2011-01-17 MED ORDER — WARFARIN SODIUM 10 MG PO TABS
10.0000 mg | ORAL_TABLET | Freq: Once | ORAL | Status: AC
  Administered 2011-01-17: 10 mg via ORAL
  Filled 2011-01-17: qty 1

## 2011-01-17 MED ORDER — SODIUM CHLORIDE 0.9 % IJ SOLN: 3.0000 mL | Freq: Two times a day (BID) | INTRAMUSCULAR | Status: DC

## 2011-01-17 MED ORDER — HEPARIN SOD (PORCINE) IN D5W 100 UNIT/ML IV SOLN
1150.0000 [IU]/h | INTRAVENOUS | Status: DC
  Administered 2011-01-17 – 2011-01-18 (×2): 1150 [IU]/h via INTRAVENOUS
  Filled 2011-01-17 (×2): qty 250

## 2011-01-17 MED ORDER — MORPHINE SULFATE 2 MG/ML IJ SOLN
2.0000 mg | Freq: Four times a day (QID) | INTRAMUSCULAR | Status: DC | PRN
  Administered 2011-01-17 – 2011-01-18 (×3): 2 mg via INTRAVENOUS
  Filled 2011-01-17 (×3): qty 1

## 2011-01-17 MED ORDER — MIDAZOLAM HCL 10 MG/2ML IJ SOLN
INTRAMUSCULAR | Status: AC
  Filled 2011-01-17: qty 2

## 2011-01-17 MED ORDER — PENICILLIN V POTASSIUM 250 MG PO TABS
500.0000 mg | ORAL_TABLET | Freq: Four times a day (QID) | ORAL | Status: DC
  Administered 2011-01-17 – 2011-01-18 (×3): 500 mg via ORAL
  Filled 2011-01-17 (×7): qty 2

## 2011-01-17 NOTE — Transfer of Care (Signed)
Immediate Anesthesia Transfer of Care Note  Patient: [name]  Procedure(s) Performed:  TRANSESOPHAGEAL ECHOCARDIOGRAM (TEE); CARDIOVERSION  Patient Location: PACU  Anesthesia Type: MAC  Level of Consciousness: awake, alert , oriented and sedated  Airway & Oxygen Therapy: Patient Spontanous Breathing and Patient connected to nasal cannula oxygen  Post-op Assessment: Report given to PACU RN, Post -op Vital signs reviewed and stable and Patient moving all extremities  Post vital signs: Reviewed and stable Filed Vitals:   01/17/11 1400  BP: 127/61  Pulse:   Temp:   Resp: 17    Complications: No apparent anesthesia complications

## 2011-01-17 NOTE — Anesthesia Postprocedure Evaluation (Signed)
Anesthesia Post Note  Patient: [name]  Procedure(s) Performed:  TRANSESOPHAGEAL ECHOCARDIOGRAM (TEE); CARDIOVERSION  Anesthesia type: MAC  Patient location: PACU  Post pain: Pain level controlled and Adequate analgesia  Post assessment: Post-op Vital signs reviewed, Patient's Cardiovascular Status Stable and Respiratory Function Stable  Last Vitals:  Filed Vitals:   01/17/11 1510  BP: 103/52  Pulse:   Temp:   Resp: 22    Post vital signs: Reviewed and stable  Level of consciousness: awake, alert  and oriented  Complications: No apparent anesthesia complications

## 2011-01-17 NOTE — Progress Notes (Signed)
PHARMACY - ANTICOAGULATION CONSULT FOLLOW UP (HEPARIN LEVEL)  Pharmacy Consult for: Heparin and Coumadin  Indication: atrial fibrillation    Patient Data:   Allergies: No Known Allergies  Patient Measurements: Height: 6\' 3"  (190.5 cm) Weight: 223 lb 14.4 oz (101.56 kg) (scale b) IBW/kg (Calculated) : 84.5    Vital Signs: Temp:  [97.5 F (36.4 C)-97.8 F (36.6 C)] 97.5 F (36.4 C) (01/15 0300) Pulse Rate:  [54-101] 61  (01/15 0300) Resp:  [18] 18  (01/15 0300) BP: (103-120)/(66-88) 107/77 mmHg (01/15 0300) SpO2:  [96 %-98 %] 98 % (01/15 0300) Weight:  [223 lb 14.4 oz (101.56 kg)] 223 lb 14.4 oz (101.56 kg) (01/15 0300)  Intake/Output from previous day:  Intake/Output Summary (Last 24 hours) at 01/17/11 0816 Last data filed at 01/17/11 0811  Gross per 24 hour  Intake    878 ml  Output   1225 ml  Net   -347 ml    Labs:  Basename 01/17/11 0535 01/17/11 0238 01/16/11 2346 01/16/11 2101 01/16/11 1542 01/15/11 2156  HGB 16.0 -- -- -- -- 16.2  HCT 45.5 -- -- -- -- 46.4  PLT 215 -- -- -- -- 237  APTT -- -- -- -- -- 38*  LABPROT 20.4* -- -- -- -- 20.5*  INR 1.71* -- -- -- -- 1.72*  HEPARINUNFRC 0.88* -- -- 0.19* -- --  CREATININE 1.84* -- -- -- -- 1.78*  CKTOTAL -- 100 HEMOLYZED SPECIMEN, RESULTS MAY BE AFFECTED -- 120 --  CKMB -- 3.2 HEMOLYZED SPECIMEN, RESULTS MAY BE AFFECTED -- 3.6 --  TROPONINI -- <0.30 HEMOLYZED SPECIMEN, RESULTS MAY BE AFFECTED -- <0.30 --   Estimated Creatinine Clearance: 54.4 ml/min (by C-G formula based on Cr of 1.84).   Assessment:  61 y.o. male with h/o atrial fibrillation for anticoagulation  Goal of Therapy:  1. Heparin level= 0.3-0.7 2.  INR =2-3  Plan:    Hold Heparin x 1 hour, then decrease Heparin 1450 units/hr.  Check heparin level in 8 hours. Coumadin 10 mg today  Geannie Risen, PharmD, BCPS

## 2011-01-17 NOTE — Progress Notes (Signed)
Pt BP: 97/69 HR: 52.  Dr. Algie Coffer notified. Orders given not to give AM dose of Lasix and Coreg.

## 2011-01-17 NOTE — Anesthesia Preprocedure Evaluation (Signed)
Anesthesia Evaluation    Airway Mallampati: II  Neck ROM: full    Dental   Pulmonary shortness of breath,          Cardiovascular hypertension, + CAD, + Past MI and +CHF + dysrhythmias Atrial Fibrillation     Neuro/Psych    GI/Hepatic GERD-  ,  Endo/Other    Renal/GU      Musculoskeletal   Abdominal   Peds  Hematology   Anesthesia Other Findings   Reproductive/Obstetrics                           Anesthesia Physical Anesthesia Plan  ASA: III  Anesthesia Plan: MAC   Post-op Pain Management:    Induction:   Airway Management Planned:   Additional Equipment:   Intra-op Plan:   Post-operative Plan:   Informed Consent: I have reviewed the patients History and Physical, chart, labs and discussed the procedure including the risks, benefits and alternatives for the proposed anesthesia with the patient or authorized representative who has indicated his/her understanding and acceptance.     Plan Discussed with:   Anesthesia Plan Comments:         Anesthesia Quick Evaluation

## 2011-01-17 NOTE — Preoperative (Signed)
Beta Blockers   Reason not to administer Beta Blockers:Not Applicable 

## 2011-01-17 NOTE — Progress Notes (Signed)
01/17/11 1750 Nursing Note: Pt's heparin drip to be stopped for an hour and at 1900 decreased to 11.15ml/hr per pharmacy. Pt stable with no complaints at this time. Will continue to monitor pt. Luismario Coston Scientist, clinical (histocompatibility and immunogenetics).

## 2011-01-17 NOTE — Progress Notes (Signed)
ANTICOAGULATION CONSULT NOTE - Follow Up Consult  Pharmacy Consult for Heparin Indication: atrial fibrillation  No Known Allergies  Patient Measurements: Height: 6\' 3"  (190.5 cm) Weight: 223 lb 14.4 oz (101.56 kg) (scale b) IBW/kg (Calculated) : 84.5  Heparin dosing Weight: 100 KG  Vital Signs: Temp: 97.4 F (36.3 C) (01/15 1527) Temp src: Oral (01/15 1527) BP: 116/81 mmHg (01/15 1527) Pulse Rate: 67  (01/15 1527)  Labs:  Basename 01/17/11 1602 01/17/11 0535 01/17/11 0238 01/16/11 2346 01/16/11 2101 01/16/11 1542 01/15/11 2156  HGB -- 16.0 -- -- -- -- 16.2  HCT -- 45.5 -- -- -- -- 46.4  PLT -- 215 -- -- -- -- 237  APTT -- -- -- -- -- -- 38*  LABPROT -- 20.4* -- -- -- -- 20.5*  INR -- 1.71* -- -- -- -- 1.72*  HEPARINUNFRC 1.04* 0.88* -- -- 0.19* -- --  CREATININE -- 1.84* -- -- -- -- 1.78*  CKTOTAL -- -- 100 HEMOLYZED SPECIMEN, RESULTS MAY BE AFFECTED -- 120 --  CKMB -- -- 3.2 HEMOLYZED SPECIMEN, RESULTS MAY BE AFFECTED -- 3.6 --  TROPONINI -- -- <0.30 HEMOLYZED SPECIMEN, RESULTS MAY BE AFFECTED -- <0.30 --   Estimated Creatinine Clearance: 54.4 ml/min (by C-G formula based on Cr of 1.84).  Assessment: Heparin level continues to increase today despite rate decrease.  No bleeding noted & level drawn appropriately.  Goal of Therapy:  Heparin level 0.3-0.7 units/ml   Plan:  1) Hold heparin x 1 hr then decrease heparin rate to 1150 units/hr 2) Recheck 6hr heparin level after restarted.    [name] 01/17/2011,5:49 PM

## 2011-01-17 NOTE — Progress Notes (Signed)
01/17/11 1850 Nursing Note: Heparin restarted at a rate of 11.5 per pharmacy. Pt is stable with no complaints. Will continue to monitor pt. Tiara Bartoli Scientist, clinical (histocompatibility and immunogenetics).

## 2011-01-17 NOTE — Op Note (Signed)
INDICATIONS:   The patient is 61 years old black male with chronic atrial fibrillation and weakness and shortness of breath.Marland Kitchen  PROCEDURE:  Informed consent was discussed including risks, benefits and alternatives for the procedure.  Risks include, but are not limited to, cough, sore throat, vomiting, nausea, somnolence, esophageal and stomach trauma or perforation, bleeding, low blood pressure, aspiration, pneumonia, infection, trauma to the teeth and death.    Patient was given sedation.  The oropharynx was anesthetized with topical lidocaine.  The transesophageal probe was inserted in the esophagus and stomach and multiple views were obtained.  Agitated saline was used after the transesophageal probe was removed from the body.  The patient was kept under observation until the patient left the procedure room.  The patient left the procedure room in stable condition.   COMPLICATIONS:  There were no immediate complications.  FINDINGS:  1. LEFT VENTRICLE: The left ventricle is normal in structure with mild dysfunction.  Mild generalized hypokinesia. No thrombus or masses seen in the left ventricle.  2. RIGHT VENTRICLE:  The right ventricle is normal in structure and function without any thrombus or masses.    3. LEFT ATRIUM:  The left atrium is normal without any thrombus or masses. Significant smoke seen  4. LEFT ATRIAL APPENDAGE:  The left atrial appendage is free of any thrombus or masses.  5. RIGHT ATRIUM:  The right atrium is free of any thrombus or masses.    6. ATRIAL SEPTUM:  The atrial septum is normal without any ASD or PFO with sonicated saline injection.   7. MITRAL VALVE:  The mitral valve is normal in structure and function with mild to moderate regurgitation. No masses, stenosis or vegetations.  8. TRICUSPID VALVE:  The tricuspid valve is normal in structure and function with mild to moderate regurgitation. No masses, stenosis or vegetations.  9. AORTIC VALVE:  The aortic valve  is normal in structure and function without regurgitation, masses, stenosis or vegetations.   10. PULMONIC VALVE:  The pulmonic valve is normal in structure and function without regurgitation, masses, stenosis or vegetations.  11. AORTIC ARCH, ASCENDING AND DESCENDING AORTA:  The aorta had mild atherosclerosis in the ascending or descending aorta.  The aortic arch was normal.  IMPRESSION:  Mild LV and RV systolic dysfunction.Marland Kitchen  RECOMMENDATIONS:   Biphasic cardioversion for A. fibrillation.

## 2011-01-17 NOTE — OR Nursing (Signed)
01-17-2011 1408 Cardioversion Note  1410 -One Shock at 150j-successful

## 2011-01-17 NOTE — Progress Notes (Signed)
  Echocardiogram Echocardiogram Transesophageal has been performed.  [name] 01/17/2011, 2:16 PM

## 2011-01-17 NOTE — Progress Notes (Signed)
01/15/11 1527 Nursing Note: Pt returned from TEE. Pt stable with no complaints and alert and oriented. Pt sitting on side of bed at this time. Will continue to monitor pt. Vitals signs : 97.4,67,93% room air,20,116/81. [name] Scientist, clinical (histocompatibility and immunogenetics).

## 2011-01-17 NOTE — Progress Notes (Signed)
Subjective:  Patient denies any chest pain or shortness of breath Complains of vague abdominal discomfort no nausea vomiting or diarrhea Patient remains in atrial fibrillation with controlled ventricular response and is scheduled for TEE assisted cardioversion today  Objective:  Vital Signs in the last 24 hours: Temp:  [97.5 F (36.4 C)-97.8 F (36.6 C)] 97.5 F (36.4 C) (01/15 0300) Pulse Rate:  [52-75] 52  (01/15 0841) Resp:  [18] 18  (01/15 0300) BP: (97-120)/(66-88) 97/69 mmHg (01/15 0841) SpO2:  [96 %-98 %] 98 % (01/15 0300) Weight:  [101.56 kg (223 lb 14.4 oz)] 101.56 kg (223 lb 14.4 oz) (01/15 0300)  Intake/Output from previous day: 01/14 0701 - 01/15 0700 In: 878 [P.O.:840; I.V.:38] Out: 1225 [Urine:1225] Intake/Output from this shift: Total I/O In: 0  Out: 300 [Urine:300]  Physical Exam: General appearance: alert and cooperative Neck: no carotid bruit, no JVD and supple, symmetrical, trachea midline Lungs: clear to auscultation bilaterally Heart: irregularly irregular rhythm and S1-S2 soft there is soft systolic murmur and S3 gallop Abdomen: soft, non-tender; bowel sounds normal; no masses,  no organomegaly Extremities: extremities normal, atraumatic, no cyanosis or edema  Lab Results:  Basename 01/17/11 0535 01/15/11 2156  WBC 6.0 4.8  HGB 16.0 16.2  PLT 215 237    Basename 01/17/11 0535 01/15/11 2156  NA 139 140  K 3.6 4.2  CL 100 104  CO2 28 25  GLUCOSE 104* 104*  BUN 32* 30*  CREATININE 1.84* 1.78*    Basename 01/17/11 0238 01/16/11 2346  TROPONINI <0.30 HEMOLYZED SPECIMEN, RESULTS MAY BE AFFECTED   Hepatic Function Panel  Basename 01/15/11 2156  PROT 7.9  ALBUMIN 3.2*  AST 35  ALT 29  ALKPHOS 60  BILITOT 0.3  BILIDIR --  IBILI --   No results found for this basename: CHOL in the last 72 hours No results found for this basename: PROTIME in the last 72 hours  Imaging: Ct Head Wo Contrast  01/17/2011  *RADIOLOGY REPORT*  Clinical  Data: Severe progressive headache.  CT HEAD WITHOUT CONTRAST  Technique:  Contiguous axial images were obtained from the base of the skull through the vertex without contrast.  Comparison: 09/01/2009  Findings: There is no acute intracranial hemorrhage, infarction, or mass lesion.  No significant abnormality of the brain parenchyma. Benign basal ganglia calcifications, unchanged.  No acute osseous abnormality.  Old healed fracture of the anterior wall of the frontal sinus.  IMPRESSION: No acute abnormalities.  Original Report Authenticated By: Gwynn Burly, M.D.   Dg Chest Port 1 View  01/15/2011  *RADIOLOGY REPORT*  Clinical Data: Shortness of breath, CHF, chest pain  PORTABLE CHEST - 1 VIEW  Comparison: 12/11/2010  Findings: Stable cardiomegaly and left subclavian AICD.  Negative for CHF, pneumonia, effusion or pneumothorax.  Trachea midline.  IMPRESSION: Cardiomegaly without CHF or pneumonia.  Original Report Authenticated By: Judie Petit. Ruel Favors, M.D.    Cardiac Studies:  Assessment/Plan:  Resolving mild decompensated systolic heart failure Nonischemic cardiomyopathy Hypertension Chronic atrial fibrillation History of nonsustained VT Glucose intolerance Chronic kidney disease stage IV History of sarcoidosis GERD Plan Continue present management Schedule for TEE cardioversion today by Dr. Algie Coffer Continue heparin and Coumadin per pharmacy Will DC home once INR about 2   LOS: 2 days    Varetta Chavers N 01/17/2011, 12:06 PM

## 2011-01-18 ENCOUNTER — Encounter (HOSPITAL_COMMUNITY): Payer: Self-pay | Admitting: Cardiovascular Disease

## 2011-01-18 MED ORDER — CARVEDILOL 6.25 MG PO TABS: 6.2500 mg | ORAL_TABLET | Freq: Two times a day (BID) | ORAL | Status: DC

## 2011-01-18 MED ORDER — RAMIPRIL 2.5 MG PO CAPS
2.5000 mg | ORAL_CAPSULE | Freq: Every day | ORAL | Status: DC
  Administered 2011-01-18: 2.5 mg via ORAL
  Filled 2011-01-18: qty 1

## 2011-01-18 MED ORDER — PENICILLIN V POTASSIUM 500 MG PO TABS: 500.0000 mg | ORAL_TABLET | Freq: Four times a day (QID) | ORAL | Status: DC

## 2011-01-18 MED ORDER — WARFARIN SODIUM 2.5 MG PO TABS
12.5000 mg | ORAL_TABLET | Freq: Once | ORAL | Status: DC
  Filled 2011-01-18: qty 1

## 2011-01-18 MED ORDER — HYDROCODONE-ACETAMINOPHEN 5-325 MG PO TABS
1.0000 | ORAL_TABLET | Freq: Four times a day (QID) | ORAL | Status: DC | PRN
Start: 2011-01-18 — End: 2011-01-24

## 2011-01-18 MED ORDER — RAMIPRIL 2.5 MG PO CAPS: 2.5000 mg | ORAL_CAPSULE | Freq: Every day | ORAL | Status: DC

## 2011-01-18 NOTE — Progress Notes (Signed)
   CARE MANAGEMENT NOTE HEART FAILURE  01/18/2011   Patient:  [name], [name]   Account Number:  [id number]    Date Initiated:  01/18/2011  Documentation initiated by:  Tera Mater  Subjective/Objective Assessment:   61yo male admitted from ED with c/o SOB and Chest Pain. Pt. lives alone.  HX CHF, AFIB, HTN.   Action/Plan:   Spoke with pt. at length about HF.  Pt. has book and could repeat the warning signs and the colors of the zones.  Pt. stated he was using [facility] prior to admit for [facility] RN and would be interested in continuing this.   Anticipated DC Date:  01/20/2011  Anticipated DC Plan:  HOME W HOME HEALTH SERVICES  DC Planning Services:  CM consult    PAC Choice:  Resumption Of Svcs/PTA Provider   Choice offered to / List presented to:      [facility] arranged:  HH-1 RN  HH-10 DISEASE MANAGEMENT     HH agency:  [facility]    Status of service:  In process, will continue to follow  Medicare Important Message Given:  NO (If response is "NO", the following Medicare IM given date fields will be blank) Date Medicare IM Given:   Date Additional Medicare IM Given:    Discharge Disposition:    Per UR Regulation:  Reviewed for med. necessity/level of care/duration of stay  Comments:   01/18/11 1150  Spoke with pt. about [facility] services and HF managment.  Pt. has been given a scale.  Spoke with Hilda Lias, with [facility] to give order for [facility] RN.  Order placed in [facility]. UR completed.  Tera Mater, RN, BSN [phone]   Initial CM contact:  01/18/2011 11:50 AM  By:  Tera Mater Initial CSW contact:     By:      Is this an INP Readmission < 30 days:  N (If "YES" please see readmission information at the bottom of note)  Patient living status prior to this admission:  ALONE  Patient setting prior to this admission:  HOME  Comorbid conditions being treated that contributed to this admission:  AFIB, CHF, HTN  CHF Readmission Risk:   high  Type of patient education provided  HF Patient Education Assessment / Teach Back  HF Zone Tool / Magnet  Limit salt intake  Weigh daily     Patient education provided by  [facility]    Was referral made to [facility]:  N  Is the patient's PCP the same as attending:  N PCP:    Readmission < 30 Days If pt has HH, did they contact the agency before going to the ED:   Name of [facility] agency:    Was the follow-up physician visit scheduled prior to discharge:    Did the patient follow-up with the physician prior to this readmission:    Was there HF Clinic visits prior to readmission:    Were there ED visits between admissions:    Readmit type:    If unscheduled and related indicate reason for readmit:

## 2011-01-18 NOTE — Progress Notes (Signed)
ANTICOAGULATION CONSULT NOTE - Follow Up Consult  Pharmacy Consult for Heparin/Coumadin Indication: atrial fibrillation  No Known Allergies  Patient Measurements: Height: 6\' 3"  (190.5 cm) Weight: 225 lb 12.8 oz (102.422 kg) (scale C) IBW/kg (Calculated) : 84.5  Heparin dosing Weight: 100 KG  Vital Signs: Temp: 98.3 F (36.8 C) (01/16 0506) Temp src: Oral (01/16 0506) BP: 119/79 mmHg (01/16 0646) Pulse Rate: 57  (01/16 0932)  Labs:  Basename 01/18/11 0106 01/17/11 1602 01/17/11 0535 01/17/11 0238 01/16/11 2346 01/16/11 1542 01/15/11 2156  HGB -- -- 16.0 -- -- -- 16.2  HCT -- -- 45.5 -- -- -- 46.4  PLT -- -- 215 -- -- -- 237  APTT -- -- -- -- -- -- 38*  LABPROT 24.2* -- 20.4* -- -- -- 20.5*  INR 2.13* -- 1.71* -- -- -- 1.72*  HEPARINUNFRC 0.23* 1.04* 0.88* -- -- -- --  CREATININE -- -- 1.84* -- -- -- 1.78*  CKTOTAL -- -- -- 100 HEMOLYZED SPECIMEN, RESULTS MAY BE AFFECTED 120 --  CKMB -- -- -- 3.2 HEMOLYZED SPECIMEN, RESULTS MAY BE AFFECTED 3.6 --  TROPONINI -- -- -- <0.30 HEMOLYZED SPECIMEN, RESULTS MAY BE AFFECTED <0.30 --   Estimated Creatinine Clearance: 54.7 ml/min (by C-G formula based on Cr of 1.84).  Assessment: 61 yo male with afib, s/p DCCV.  On IV heparin and Coumadin. Heparin level a little subtherapeutic this AM, but INR is now therapeutic.  No bleeding noted.  Goal of Therapy:  Heparin level 0.3-0.7 units/ml INR 2-3   Plan:  1. Spoke with Dr. [name], will d/c IV heparin since INR therapeutic. 2) Coumadin 12.5 mg tonight to keep INR in therapeutic range.  Usual home dose of Coumadin 10 mg daily.   Samyiah Halvorsen C 01/18/2011,11:33 AM

## 2011-01-18 NOTE — Discharge Instructions (Signed)
Heart Failure Heart failure (HF) is a condition in which the heart has trouble pumping blood. This means your heart does not pump blood efficiently for your body to work well. In some cases of HF, fluid may back up into your lungs or you may have swelling (edema) in your lower legs. HF is a long-term (chronic) condition. It is important for you to take good care of yourself and follow your caregiver's treatment plan. CAUSES   Health conditions:   High blood pressure (hypertension) causes the heart muscle to work harder than normal. When pressure in the blood vessels is high, the heart needs to pump (contract) with more force in order to circulate blood throughout the body. High blood pressure eventually causes the heart to become stiff and weak.   Coronary artery disease (CAD) is the buildup of cholesterol and fat (plaques) in the arteries of the heart. The blockage in the arteries deprives the heart muscle of oxygen and blood. This can cause chest pain and may lead to a heart attack. High blood pressure can also contribute to CAD.   Heart attack (myocardial infarction) occurs when 1 or more arteries in the heart become blocked. The loss of oxygen damages the muscle tissue of the heart. When this happens, part of the heart muscle dies. The injured tissue does not contract as well and weakens the heart's ability to pump blood.   Abnormal heart valves can cause HF when the heart valves do not open and close properly. This makes the heart muscle pump harder to keep the blood flowing.   Heart muscle disease (cardiomyopathy or myocarditis) is damage to the heart muscle from a variety of causes. These can include drug or alcohol abuse, infections, or unknown reasons. These can increase the risk of HF.   Lung disease makes the heart work harder because the lungs do not work properly. This can cause a strain on the heart leading it to fail.   Diabetes increases the risk of HF. High blood sugar contributes  to high fat (lipid) levels in the blood. Diabetes can also cause slow damage to tiny blood vessels that carry important nutrients to the heart muscle. When the heart does not get enough oxygen and food, it can cause the heart to become weak and stiff. This leads to a heart that does not contract efficiently.   Other diseases can contribute to HF. These include abnormal heart rhythms, thyroid problems, and low blood counts (anemia).   Unhealthy lifestyle habits:   Obesity.   Smoking.   Eating foods high in fat and cholesterol.   Eating or drinking beverages high in salt.   Drug or alcohol abuse.   Lack of exercise.  SYMPTOMS  HF symptoms may vary and can be hard to detect. Symptoms may include:  Shortness of breath with activity, such as climbing stairs.   Persistent cough.   Swelling of the feet, ankles, legs, or abdomen.   Unexplained weight gain.   Difficulty breathing when lying flat.   Waking from sleep because of the need to sit up and get more air.   Rapid heartbeat.   Fatigue and loss of energy.   Feeling lightheaded or close to fainting.  DIAGNOSIS  A diagnosis of HF is based on your history, symptoms, physical examination, and diagnostic tests. Diagnostic tests for HF may include:  EKG.   Chest X-ray.   Blood tests.   Exercise stress test.   Blood oxygen test (arterial blood gas).   Evaluation   by a heart doctor (cardiologist).   Ultrasound evaluation of the heart (echocardiogram).   Heart artery test to look for blockages (angiogram).   Radioactive imaging to look at the heart (radionuclide test).  TREATMENT  Treatment is aimed at managing the symptoms of HF. Medicines, lifestyle changes, or surgical intervention may be necessary to treat HF.  Medicines to help treat HF may include:   Angiotensin-converting enzyme (ACE) inhibitors. These block the effects of a blood protein called angiotensin-converting enzyme. ACE inhibitors relax (dilate) the  blood vessels and help lower blood pressure. This decreases the workload of the heart, slows the progression of HF, and improves symptoms.   Angiotensin receptor blockers (ARBs). These medications work similar to ACE inhibitors. ARBs may be an alternative for people who cannot tolerate an ACE inhibitor.   Aldosterone antagonists. This medication helps get rid of extra fluid from your body. This lowers the volume of blood the heart has to pump.   Water pills (diuretics). Diuretics cause the kidneys to remove salt and water from the blood. The extra fluid is removed by urination. By removing extra fluid from the body, diuretics help lower the workload of the heart and help prevent fluid buildup in the lungs so breathing is easier.   Beta blockers. These prevent the heart from beating too fast and improve heart muscle strength. Beta blockers help maintain a normal heart rate, control blood pressure, and improve HF symptoms.   Digitalis. This increases the force of the heartbeat and may be helpful to people with HF or heart rhythm problems.   Healthy lifestyle changes include:   Stopping smoking.   Eating a healthy diet. Avoid foods high in fat. Avoid foods fried in oil or made with fat. A dietician can help with healthy food choices.   Limiting how much salt you eat.   Limiting alcohol intake to no more than 1 drink per day for women and 2 drinks per day for men. Drinking more than that is harmful to your heart. If your heart has already been damaged by alcohol or you have severe HF, drinking alcohol should be stopped completely.   Exercising as directed by your caregiver.   Surgical treatment for HF may include:   Procedures to open blocked arteries, repair damaged heart valves, or remove damaged heart muscle tissue.   A pacemaker to help heart muscle function and to control certain abnormal heart rhythms.   A defibrillator to possibly prevent sudden cardiac death.  HOME CARE  INSTRUCTIONS   Activity level. Your caregiver can help you determine what type of exercise program may be helpful. It is important to maintain your strength. Pace your physical activity to avoid shortness of breath or chest pain. Rest for 1 hour before and after meals. A cardiac rehabilitation program may be helpful to some people with HF.   Diet. Eat a heart healthy diet. Food choices should be low in saturated fat and cholesterol. Talk to a dietician to learn about heart healthy foods.   Salt intake. When you have HF, you need to limit the amount of salt you eat. Eat less than 1500 milligrams (mg) of salt per day or as recommended by your caregiver.   Weight monitoring. Weigh yourself every day. You should weigh yourself in the morning after you urinate and before you eat breakfast. Wear the same amount of clothing each time you weigh yourself. Record your weight daily. Bring your recorded weights to your clinic visits. Tell your caregiver right away if   you have gained 3 lb/1.4 kg in 1 day, or 5 lb/2.3 kg in a week or whatever amount you were told to report.   Blood pressure monitoring. This should be done as directed by your caregiver. A home blood pressure cuff can be purchased at a drugstore. Record your blood pressure numbers and bring them to your clinic visits. Tell your caregiver if you become dizzy or lightheaded upon standing up.   Smoking. If you are currently a smoker, it is time to quit. Nicotine makes your heart work harder by causing your blood vessels to constrict. Do not use nicotine gum or patches before talking to your caregiver.   Follow up. Be sure to schedule a follow-up visit with your caregiver. Keep all your appointments.  SEEK MEDICAL CARE IF:   Your weight increases by 3 lb/1.4 kg in 1 day or 5 lb/2.3 kg in a week.   You notice increasing shortness of breath that is unusual for you. This may happen during rest, sleep, or with activity.   You cough more than normal,  especially with physical activity.   You notice more swelling in your hands, feet, ankles, or belly (abdomen).   You are unable to sleep because it is hard to breathe.   You cough up bloody mucus (sputum).   You begin to feel "jumping" or "fluttering" sensations (palpitations) in your chest.  SEEK IMMEDIATE MEDICAL CARE IF:   You have severe chest pain or pressure which may include symptoms such as:   Pain or pressure in the arms, neck, jaw, or back.   Feeling sweaty.   Feeling sick to your stomach (nauseous).   Feeling short of breath while at rest.   Having a fast or irregular heartbeat.   You experience stroke symptoms. These symptoms include:   Facial weakness or numbness.   Weakness or numbness in an arm, leg, or on one side of your body.   Blurred vision.   Difficulty talking or thinking.   Dizziness or fainting.   Severe headache.  THESE ARE MEDICAL EMERGENCIES. Do not wait to see if the symptoms go away. Call your local emergency services (911 in [place] DO NOT drive yourself to the hospital. IMPORTANT  Make a list of every medicine, vitamin, or herbal supplement you are taking. Keep the list with you at all times. Show it to your caregiver at every visit. Keep the list up-to-date.   Ask your caregiver or pharmacist to write an explanation of each medicine you are taking. This should include:   Why you are taking it.   The possible side effects.   The best time of day to take it.   Foods to take with it or what foods to avoid.   When to stop taking it.  MAKE SURE YOU:   Understand these instructions.   Will watch your condition.   Will get help right away if you are not doing well or get worse.  Document Released: 12/19/2004 Document Revised: 08/31/2010 Document Reviewed: 04/02/2009 [employer] Patient Information 2012 [employer]. 

## 2011-01-18 NOTE — Discharge Summary (Signed)
  Discharge summary dictated on 01/18/2011 dictation number is [phone]

## 2011-01-18 NOTE — Progress Notes (Signed)
01/18/11 1550 Nursing Note: Pt to be discharged per MD's order. IV and cardiac monitor discontinued per MD's order. Pt recieved all discharge information including CHF packet and education. Pt verbalize understanding. Will escort pt to car safely. George Hugh RN

## 2011-01-19 NOTE — Discharge Summary (Signed)
NAMEQUANTARIUS, [name]             ACCOUNT NO.:  [id number]  MEDICAL RECORD NO.:  [id number]  LOCATION:  4732                         FACILITY:  [facility]  PHYSICIAN:  Marisah Laker N. [name], M.D. DATE OF BIRTH:  09/14/1950  DATE OF ADMISSION:  01/15/2011 DATE OF DISCHARGE:  01/18/2011                              DISCHARGE SUMMARY   ADMITTING DIAGNOSES:  Acute on chronic systolic heart failure, chest pain, dilated cardiomyopathy with ICD, sarcoidosis and chronic atrial fibrillation.  DISCHARGE DIAGNOSES: 1. Compensated systolic heart failure, severe nonischemic dilated     cardiomyopathy, status post BiV ICD. 2. Status post atrial fibrillation, status post DC cardioversion with     TEE assistance. 3. Hypertension. 4. History of nonsustained VT in the past. 5. Glucose intolerance. 6. Chronic kidney disease, stage 4 stable. 7. History of sarcoidosis. 8. Gastroesophageal reflux disease. 9. Dental caries, left jaw.  DISCHARGE HOME MEDICATIONS: 1. Hydrocodone/acetaminophen 5/325, 1-2 tablets every 6 h. as needed     for pain. 2. Penicillin V potassium 500 mg 1 tablet every 6 h. for 2 weeks. 3. Ramipril 2.5 mg 1 capsule daily. 4. Allopurinol 100 mg 1 tablet daily. 5. Amiodarone 200 mg 1 tablet daily. 6. Enteric-coated aspirin 81 mg 1 tablet daily. 7. Carvedilol 6.25 mg 1 tablet twice daily. 8. Digoxin 0.25 mg 1 tablet daily. 9. Lasix 80 mg 1 tablet twice daily. 10.Protonix 40 mg 1 tablet daily. 11.Potassium chloride 20 mEq 1 tablet twice daily. 12.Crestor 5 mg 1 tablet daily. 13.Carafate 1 g 4 times daily. 14.Warfarin 10 mg 1 tablet daily.  DIET:  Low salt, low cholesterol 1800 calories ADA diet.  The patient has been advised to monitor blood pressure and weight daily.  Heart failure instructions have been given.  The patient has been advised to monitor ProTime every week.  We will also check renal function next week while on ACE inhibitors.  CONDITION AT DISCHARGE:   Stable.  FOLLOWUP:  With me in 1 week.  BRIEF HISTORY AND HOSPITAL COURSE:  [name] is a 61 year old male with past medical history significantfor multiple medical problems, i.e., nonischemic dilated cardiomyopathy, status post ICD in the past, history of recurrent systolic heart failure secondary to noncompliance to meds and diet, hypertension, chronic atrial fibrillation, history of nonsustained VT, glucose intolerance, GERD, chronic kidney disease, history of sarcoidosis was admitted by Dr. Algie Coffer because of 1-week history of exertional shortness of breath and 1-day history of substernal vague chest pain.  The patient denies any fever, chills, cough and cold. The patient recently saw [name] for ICD evaluation.  PAST MEDICAL HISTORY:  As above.  PAST SURGICAL HISTORY:  He had back surgery in the past.  SOCIAL HISTORY:  Denies any history of alcohol or tobacco abuse.  ALLERGIES:  No known drug allergies.  PHYSICAL EXAMINATION:  GENERAL:  He was alert, awake and oriented x3. VITAL SIGNS:  His blood pressure was 138/87, pulse was 65, temperature was 97.8 and O2 sats were 100%. EYES:  Conjunctivae was pink.  Sclerae was nonicteric. NECK:  Supple.  Positive JVD. CARDIOVASCULAR:  Irregularly irregular.  S1 and S2 was normal.  There was 3/6 systolic murmur.  LUNGS:  He has bibasilar crackles.  ABDOMEN:  Soft and nontender. EXTREMITIES:  There is no clubbing, cyanosis or edema. NEURO:  Grossly intact.  LABORATORY DATA:  Sodium was 140, potassium 4.2, BUN 30, creatinine 1.78.  Three sets of cardiac enzymes were negative.  His proBNP was 2490, hemoglobin was 16.2, hematocrit 46.4, white count of 4.8.  His D- dimer were negative 0.26.  TSH was slightly elevated at 6.07.  BRIEF HOSPITAL COURSE:  The patient was admitted to Telemetry Unit.  MI was ruled out by serial enzymes and EKG.  The patient had episode of AFib with slow ventricular response.  His ICD was reprogrammed.   The patient subsequently underwent a TEE-assisted cardioversion without any problems.  The patient was started on IV Lasix with good diuresis.  The patient remained after cardioversion in sinus rhythm.  His INR is in therapeutic range today.  The patient will be discharged home on above medications and will be followed up in my office in 1 week.  The patient has been advised to check PT/INR every week and take his medications regularly.  The patient also has been instructed regarding fluid restriction and lifestyle modification and avoidance of salty food intake.     Eduardo Osier. [name], M.D.     [facility]  D:  01/18/2011  T:  01/19/2011  Job:  [id number]

## 2011-01-24 ENCOUNTER — Emergency Department (HOSPITAL_COMMUNITY)
Admission: EM | Admit: 2011-01-24 | Discharge: 2011-01-24 | Disposition: A | Discharge status: Home / Self Care | Payer: PRIVATE HEALTH INSURANCE | Attending: Emergency Medicine | Admitting: Emergency Medicine

## 2011-01-24 ENCOUNTER — Encounter (HOSPITAL_COMMUNITY): Payer: Self-pay | Admitting: Emergency Medicine

## 2011-01-24 DIAGNOSIS — R51 Headache: Secondary | ICD-10-CM | POA: Insufficient documentation

## 2011-01-24 DIAGNOSIS — I2510 Atherosclerotic heart disease of native coronary artery without angina pectoris: Secondary | ICD-10-CM | POA: Insufficient documentation

## 2011-01-24 DIAGNOSIS — Z7901 Long term (current) use of anticoagulants: Secondary | ICD-10-CM | POA: Insufficient documentation

## 2011-01-24 DIAGNOSIS — I4891 Unspecified atrial fibrillation: Secondary | ICD-10-CM | POA: Insufficient documentation

## 2011-01-24 DIAGNOSIS — W503XXA Accidental bite by another person, initial encounter: Secondary | ICD-10-CM | POA: Insufficient documentation

## 2011-01-24 DIAGNOSIS — E7800 Pure hypercholesterolemia, unspecified: Secondary | ICD-10-CM | POA: Insufficient documentation

## 2011-01-24 DIAGNOSIS — K219 Gastro-esophageal reflux disease without esophagitis: Secondary | ICD-10-CM | POA: Insufficient documentation

## 2011-01-24 DIAGNOSIS — R5383 Other fatigue: Secondary | ICD-10-CM | POA: Insufficient documentation

## 2011-01-24 DIAGNOSIS — I252 Old myocardial infarction: Secondary | ICD-10-CM | POA: Insufficient documentation

## 2011-01-24 DIAGNOSIS — I129 Hypertensive chronic kidney disease with stage 1 through stage 4 chronic kidney disease, or unspecified chronic kidney disease: Secondary | ICD-10-CM | POA: Insufficient documentation

## 2011-01-24 DIAGNOSIS — Z7982 Long term (current) use of aspirin: Secondary | ICD-10-CM | POA: Insufficient documentation

## 2011-01-24 DIAGNOSIS — S01512A Laceration without foreign body of oral cavity, initial encounter: Secondary | ICD-10-CM

## 2011-01-24 DIAGNOSIS — R5381 Other malaise: Secondary | ICD-10-CM | POA: Insufficient documentation

## 2011-01-24 DIAGNOSIS — S01502A Unspecified open wound of oral cavity, initial encounter: Secondary | ICD-10-CM | POA: Insufficient documentation

## 2011-01-24 DIAGNOSIS — D869 Sarcoidosis, unspecified: Secondary | ICD-10-CM | POA: Insufficient documentation

## 2011-01-24 DIAGNOSIS — I509 Heart failure, unspecified: Secondary | ICD-10-CM | POA: Insufficient documentation

## 2011-01-24 DIAGNOSIS — N183 Chronic kidney disease, stage 3 unspecified: Secondary | ICD-10-CM | POA: Insufficient documentation

## 2011-01-24 DIAGNOSIS — Z79899 Other long term (current) drug therapy: Secondary | ICD-10-CM | POA: Insufficient documentation

## 2011-01-24 LAB — CBC
HCT: 43.9 % (ref 39.0–52.0)
Hemoglobin: 15.2 g/dL (ref 13.0–17.0)
RBC: 4.98 MIL/uL (ref 4.22–5.81)

## 2011-01-24 LAB — PROTIME-INR
INR: 3.69 — ABNORMAL HIGH (ref 0.00–1.49)
Prothrombin Time: 37.2 seconds — ABNORMAL HIGH (ref 11.6–15.2)

## 2011-01-24 MED ORDER — PHENYLEPHRINE HCL 1 % NA SOLN
1.0000 [drp] | NASAL | Status: AC
  Administered 2011-01-24: 1 [drp] via NASAL
  Filled 2011-01-24: qty 15

## 2011-01-24 MED ORDER — ALBUTEROL (5 MG/ML) CONTINUOUS INHALATION SOLN: 15.0000 mg/h | INHALATION_SOLUTION | RESPIRATORY_TRACT | Status: DC

## 2011-01-24 MED ORDER — MORPHINE SULFATE 4 MG/ML IJ SOLN
6.0000 mg | Freq: Once | INTRAMUSCULAR | Status: AC
  Administered 2011-01-24: 6 mg via INTRAVENOUS
  Filled 2011-01-24: qty 2

## 2011-01-24 MED ORDER — PHYTONADIONE 5 MG PO TABS
5.0000 mg | ORAL_TABLET | ORAL | Status: AC
  Administered 2011-01-24: 5 mg via ORAL
  Filled 2011-01-24 (×2): qty 1

## 2011-01-24 MED ORDER — EPINEPHRINE HCL (NASAL) 0.1 % NA SOLN
1.0000 [drp] | NASAL | Status: DC
  Filled 2011-01-24: qty 30

## 2011-01-24 MED ORDER — PHYTONADIONE 5 MG PO TABS: 10.0000 mg | ORAL_TABLET | Freq: Once | ORAL | Status: DC

## 2011-01-24 MED ORDER — HYDROCODONE-ACETAMINOPHEN 5-500 MG PO TABS: 1.0000 | ORAL_TABLET | Freq: Four times a day (QID) | ORAL | Status: AC | PRN

## 2011-01-24 MED ORDER — METHYLPREDNISOLONE SODIUM SUCC 125 MG IJ SOLR: 125.0000 mg | Freq: Once | INTRAMUSCULAR | Status: DC

## 2011-01-24 NOTE — ED Notes (Signed)
Bite his tongue last night and now tongue will not stop bleeding has a bad h/a

## 2011-01-24 NOTE — ED Provider Notes (Signed)
  I performed a history and physical examination of [name] and discussed his management with Dr. Meredith Pel  I agree with the history, physical, assessment, and plan of care, with the following exceptions: None  I was present for the following procedures: Laceration Repair and attempts at hemostasis of the tongue laceration. Time Spent in Critical Care of the patient: None   Markeesha Char, Elvis Coil, MD 01/24/11 [phone]

## 2011-01-24 NOTE — ED Notes (Signed)
Tongue continues to bleed

## 2011-01-24 NOTE — ED Notes (Signed)
Bleeding has been controlled. Laceration to tongue no longer bleeding

## 2011-01-24 NOTE — ED Notes (Signed)
Pt reports biting tongue last night. Bleeding has persisted since last night. At this time pt has laceration to dorsal surface of right side tongue. Bleeding present. Pt provided with gauze and emesis bag.

## 2011-01-24 NOTE — ED Notes (Signed)
Pt moved to room. Pt reports dizziness with ambulation.

## 2011-01-24 NOTE — ED Notes (Signed)
Called pharmacy to remind need to send vitamin k tablet.

## 2011-01-24 NOTE — ED Provider Notes (Signed)
History     CSN: [id number]  Arrival date & time 01/24/11  7829   First MD Initiated Contact with Patient 01/24/11 1055      Chief Complaint  Patient presents with  . Facial Pain    (Consider location/radiation/quality/duration/timing/severity/associated sxs/prior treatment) Patient is a 61 y.o. male presenting with skin laceration. The history is provided by the patient.  Laceration  Incident onset: last night  Pain location: tongue. The laceration is 1 cm in size. Injury mechanism: accidentally bit tongue while eating  The pain is mild. The pain has been constant since onset. He reports no foreign bodies present.    Past Medical History  Diagnosis Date  . Hypertension   . CHF (congestive heart failure)   . Sarcoidosis   . Cardiomyopathy     non ischemic by cath  . Acute on chronic systolic heart failure   . Automatic implantable cardiac defibrillator in situ   . Atrial fibrillation   . NSVT (nonsustained ventricular tachycardia)   . Chronic kidney disease (CKD), stage III (moderate)   . GERD (gastroesophageal reflux disease)   . Hypercholesteremia   . Coronary artery disease   . Myocardial infarction   . Shortness of breath     Past Surgical History  Procedure Date  . Back surgery 1987    Ruptured disk repair  . Pacemaker insertion     with ICD  . Tee without cardioversion 01/17/2011    Procedure: TRANSESOPHAGEAL ECHOCARDIOGRAM (TEE);  Surgeon: Ricki Rodriguez, MD;  Location: [facility] ENDOSCOPY;  Service: Cardiovascular;  Laterality: N/A;  . Cardioversion 01/17/2011    Procedure: CARDIOVERSION;  Surgeon: Ricki Rodriguez, MD;  Location: [facility] ENDOSCOPY;  Service: Cardiovascular;  Laterality: N/A;    Family History  Problem Relation Age of Onset  . Heart disease    . Heart failure    . Stroke    . Anesthesia problems Neg Hx   . Hypotension Neg Hx   . Malignant hyperthermia Neg Hx   . Pseudochol deficiency Neg Hx     History  Substance Use Topics  . Smoking status:  Never Smoker   . Smokeless tobacco: Not on file  . Alcohol Use: No      Review of Systems  Constitutional: Positive for fatigue (mild). Negative for fever, chills, activity change and appetite change.  HENT: Negative for congestion, sore throat, rhinorrhea, neck pain and neck stiffness.   Eyes: Negative for photophobia, redness and visual disturbance.  Respiratory: Negative for cough, shortness of breath and wheezing.   Cardiovascular: Negative for chest pain, palpitations and leg swelling.  Gastrointestinal: Negative for nausea, vomiting, abdominal pain, diarrhea, constipation and blood in stool.  Genitourinary: Negative for dysuria, urgency, hematuria and flank pain.  Musculoskeletal: Negative for back pain.  Skin: Positive for wound (tongue laceration). Negative for rash.  Neurological: Negative for dizziness, seizures, facial asymmetry, speech difficulty, weakness, light-headedness, numbness and headaches.  Psychiatric/Behavioral: Negative for confusion.  All other systems reviewed and are negative.    Allergies  Review of patient's allergies indicates no known allergies.  Home Medications   Current Outpatient Rx  Name Route Sig Dispense Refill  . AMIODARONE HCL 200 MG PO TABS Oral Take 200 mg by mouth daily.    . ASPIRIN EC 81 MG PO TBEC Oral Take 81 mg by mouth daily.    Marland Kitchen CARVEDILOL 6.25 MG PO TABS Oral Take 6.25 mg by mouth 2 (two) times daily with a meal.    . DIGOXIN 0.25 MG  PO TABS Oral Take 250 mcg by mouth daily.     . FUROSEMIDE 80 MG PO TABS Oral Take 80 mg by mouth 2 (two) times daily.    Marland Kitchen HYDROCODONE-ACETAMINOPHEN 5-325 MG PO TABS Oral Take 1-2 tablets by mouth every 6 (six) hours as needed. For pain    . PENICILLIN V POTASSIUM 500 MG PO TABS Oral Take 500 mg by mouth 4 (four) times daily. Starting on 01-18-11 -- for 14 days    . POTASSIUM CHLORIDE CRYS ER 20 MEQ PO TBCR Oral Take 20 mEq by mouth 2 (two) times daily.    Marland Kitchen RAMIPRIL 2.5 MG PO CAPS Oral Take 2.5 mg  by mouth daily.    Marland Kitchen ROSUVASTATIN CALCIUM 5 MG PO TABS Oral Take 5 mg by mouth at bedtime.     . SUCRALFATE 1 G PO TABS Oral Take 1 g by mouth 4 (four) times daily -  with meals and at bedtime.     . WARFARIN SODIUM 10 MG PO TABS Oral Take 10 mg by mouth at bedtime.       BP 125/90  Pulse 60  Temp(Src) 98.1 F (36.7 C) (Oral)  Resp 16  SpO2 99%  Physical Exam  Nursing note and vitals reviewed. Constitutional: He is oriented to person, place, and time. He appears well-developed and well-nourished.  Non-toxic appearance. No distress.  HENT:  Head: Normocephalic and atraumatic.  Mouth/Throat: Oropharynx is clear and moist.       1cm superficial laceration to right underside of tongue with blood oozing from laceration.   Eyes: Conjunctivae and EOM are normal. Pupils are equal, round, and reactive to light. No scleral icterus.  Neck: Normal range of motion. Neck supple. No JVD present.  Cardiovascular: Normal rate, regular rhythm, normal heart sounds and intact distal pulses.   No murmur heard. Pulmonary/Chest: Effort normal and breath sounds normal. No respiratory distress. He has no wheezes. He has no rales.  Abdominal: Soft. Bowel sounds are normal. He exhibits no distension. There is no tenderness. There is no rebound and no guarding.  Musculoskeletal: Normal range of motion.  Neurological: He is alert and oriented to person, place, and time. He has normal strength. No cranial nerve deficit. GCS eye subscore is 4. GCS verbal subscore is 5. GCS motor subscore is 6.  Skin: Skin is warm and dry. No rash noted. He is not diaphoretic.  Psychiatric: He has a normal mood and affect.    ED Course  LACERATION REPAIR Date/Time: 01/24/2011 1:55 PM Performed by: Verne Carrow Authorized by: Wyona Almas B Consent: Verbal consent obtained. Consent given by: patient Patient understanding: patient states understanding of the procedure being performed Patient consent: the patient's  understanding of the procedure matches consent given Procedure consent: procedure consent matches procedure scheduled Site marked: the operative site was marked Patient identity confirmed: verbally with patient, arm band, provided demographic data and hospital-assigned identification number Location: tongue. Laceration length: 1 cm Foreign bodies: no foreign bodies Tendon involvement: none Nerve involvement: none Vascular damage: no Anesthesia: local infiltration Local anesthetic: lidocaine 1% with epinephrine Anesthetic total: 0.5 ml Patient sedated: no Wound mucous membrane closure material used: 5-0 vicryl. Number of sutures: 2 Technique: simple and horizontal mattress Approximation: close Approximation difficulty: simple Dressing: 4x4 sterile gauze Patient tolerance: Patient tolerated the procedure well with no immediate complications.   (including critical care time)  Labs Reviewed  PROTIME-INR - Abnormal; Notable for the following:    Prothrombin Time 37.2 (*)  INR 3.69 (*)    All other components within normal limits  CBC  URINALYSIS, ROUTINE W REFLEX MICROSCOPIC   No results found.   1. Tongue laceration       MDM  61yo AAM with PMH significant for CHF, CAD, and atrial fibrillation on coumadin who presents to the ED due to tongue laceration. He accidentally bit his tongue while eating last night. Has had bleeding from tongue since. On exam he has superficial 1cm laceration to tongue. Blood does not appear to be arterial as it is just oozing out slowly. He states on ROS that he has had mild fatigue but no dyspnea, palpitations, lightheadedness, or dizziness. Will apply phenylephrine topically with compression and reassess. Checking CBC due to fatigue and will check INR.    INR 3.6. Pt reassessed. Bleeding as slowed to minimal oozing with topical phenylephrine. Additional phenylephrine applied. Will give pain med and will suture. 5mg  oral vit K given.   Tongue  sutured. After lac repair and lido c epi bleeding stopped.   Pt monitored to return of bleeding. He has already called his doctor for follow up tomorrow. Return precautions given.       Verne Carrow, MD 01/24/11 [phone]

## 2011-01-26 ENCOUNTER — Encounter (HOSPITAL_COMMUNITY): Payer: Self-pay | Admitting: *Deleted

## 2011-01-26 ENCOUNTER — Emergency Department (HOSPITAL_COMMUNITY)
Admission: EM | Admit: 2011-01-26 | Discharge: 2011-01-26 | Disposition: A | Discharge status: Home / Self Care | Payer: PRIVATE HEALTH INSURANCE | Attending: Emergency Medicine | Admitting: Emergency Medicine

## 2011-01-26 DIAGNOSIS — E7800 Pure hypercholesterolemia, unspecified: Secondary | ICD-10-CM | POA: Insufficient documentation

## 2011-01-26 DIAGNOSIS — R040 Epistaxis: Secondary | ICD-10-CM | POA: Insufficient documentation

## 2011-01-26 DIAGNOSIS — S01512A Laceration without foreign body of oral cavity, initial encounter: Secondary | ICD-10-CM

## 2011-01-26 DIAGNOSIS — I509 Heart failure, unspecified: Secondary | ICD-10-CM | POA: Insufficient documentation

## 2011-01-26 DIAGNOSIS — W503XXA Accidental bite by another person, initial encounter: Secondary | ICD-10-CM | POA: Insufficient documentation

## 2011-01-26 DIAGNOSIS — I129 Hypertensive chronic kidney disease with stage 1 through stage 4 chronic kidney disease, or unspecified chronic kidney disease: Secondary | ICD-10-CM | POA: Insufficient documentation

## 2011-01-26 DIAGNOSIS — I252 Old myocardial infarction: Secondary | ICD-10-CM | POA: Insufficient documentation

## 2011-01-26 DIAGNOSIS — Z7901 Long term (current) use of anticoagulants: Secondary | ICD-10-CM | POA: Insufficient documentation

## 2011-01-26 DIAGNOSIS — I4891 Unspecified atrial fibrillation: Secondary | ICD-10-CM | POA: Insufficient documentation

## 2011-01-26 DIAGNOSIS — D869 Sarcoidosis, unspecified: Secondary | ICD-10-CM | POA: Insufficient documentation

## 2011-01-26 DIAGNOSIS — Z79899 Other long term (current) drug therapy: Secondary | ICD-10-CM | POA: Insufficient documentation

## 2011-01-26 DIAGNOSIS — I5022 Chronic systolic (congestive) heart failure: Secondary | ICD-10-CM | POA: Insufficient documentation

## 2011-01-26 DIAGNOSIS — S01502A Unspecified open wound of oral cavity, initial encounter: Secondary | ICD-10-CM | POA: Insufficient documentation

## 2011-01-26 DIAGNOSIS — Z7982 Long term (current) use of aspirin: Secondary | ICD-10-CM | POA: Insufficient documentation

## 2011-01-26 DIAGNOSIS — N183 Chronic kidney disease, stage 3 unspecified: Secondary | ICD-10-CM | POA: Insufficient documentation

## 2011-01-26 DIAGNOSIS — Z95810 Presence of automatic (implantable) cardiac defibrillator: Secondary | ICD-10-CM | POA: Insufficient documentation

## 2011-01-26 DIAGNOSIS — I2510 Atherosclerotic heart disease of native coronary artery without angina pectoris: Secondary | ICD-10-CM | POA: Insufficient documentation

## 2011-01-26 DIAGNOSIS — K219 Gastro-esophageal reflux disease without esophagitis: Secondary | ICD-10-CM | POA: Insufficient documentation

## 2011-01-26 LAB — BASIC METABOLIC PANEL
BUN: 26 mg/dL — ABNORMAL HIGH (ref 6–23)
CO2: 23 mEq/L (ref 19–32)
Calcium: 9.8 mg/dL (ref 8.4–10.5)
Chloride: 107 mEq/L (ref 96–112)
Creatinine, Ser: 1.64 mg/dL — ABNORMAL HIGH (ref 0.50–1.35)
GFR calc Af Amer: 51 mL/min — ABNORMAL LOW (ref >90)

## 2011-01-26 LAB — URINALYSIS, ROUTINE W REFLEX MICROSCOPIC
Bilirubin Urine: NEGATIVE
Ketones, ur: NEGATIVE mg/dL
Nitrite: NEGATIVE
Protein, ur: 100 mg/dL — AB
pH: 5.5 (ref 5.0–8.0)

## 2011-01-26 LAB — CBC
MCV: 88.7 fL (ref 78.0–100.0)
Platelets: 176 10*3/uL (ref 150–400)
RDW: 12.8 % (ref 11.5–15.5)
WBC: 4.8 10*3/uL (ref 4.0–10.5)

## 2011-01-26 LAB — PROTIME-INR: Prothrombin Time: 23.5 seconds — ABNORMAL HIGH (ref 11.6–15.2)

## 2011-01-26 LAB — APTT: aPTT: 41 seconds — ABNORMAL HIGH (ref 24–37)

## 2011-01-26 MED ORDER — OXYMETAZOLINE HCL 0.05 % NA SOLN: 2.0000 | Freq: Three times a day (TID) | NASAL | Status: AC

## 2011-01-26 MED ORDER — AMINOCAPROIC ACID SOLUTION 5% (50 MG/ML)
5.0000 mL | Freq: Once | ORAL | Status: AC
  Administered 2011-01-26: 5 mL via ORAL
  Filled 2011-01-26: qty 100

## 2011-01-26 MED ORDER — AMINOCAPROIC ACID 25 % PO SYRP: 1.0000 g | ORAL_SOLUTION | ORAL | Status: AC

## 2011-01-26 MED ORDER — AMINOCAPROIC ACID SOLUTION 5% (50 MG/ML): 5.0000 mL | Freq: Once | ORAL | Status: DC

## 2011-01-26 NOTE — ED Notes (Addendum)
Pt bit tongue and had 2 stitches on right side of tongue.  Stitches are still bleeding. Pt also complains of rt side of his nose bleeding during his sleep.  Pt does take blood thinners, doesn't recall name of med.  Pt oriented and alert X4.  Pain 8/10

## 2011-01-26 NOTE — ED Provider Notes (Signed)
History     CSN: [id number]  Arrival date & time 01/26/11  0913   First MD Initiated Contact with Patient 01/26/11 1026      Chief Complaint  Patient presents with  . Coagulation Disorder    (Consider location/radiation/quality/duration/timing/severity/associated sxs/prior treatment) Patient is a 61 y.o. male presenting with nosebleeds and skin laceration. The history is provided by the patient.  Epistaxis  This is a new problem. The current episode started yesterday. Episode frequency: intermittently. The problem has been resolved. The problem is associated with anticoagulants. The bleeding has been from both nares. He has tried applying pressure for the symptoms. The treatment provided significant relief.  Laceration  Incident onset: about 3 days ago  Pain location: tongue. The laceration is 1 cm in size. Injury mechanism: bit tongue. The pain is mild.    Past Medical History  Diagnosis Date  . Hypertension   . CHF (congestive heart failure)   . Sarcoidosis   . Cardiomyopathy     non ischemic by cath  . Acute on chronic systolic heart failure   . Automatic implantable cardiac defibrillator in situ   . Atrial fibrillation   . NSVT (nonsustained ventricular tachycardia)   . Chronic kidney disease (CKD), stage III (moderate)   . GERD (gastroesophageal reflux disease)   . Hypercholesteremia   . Coronary artery disease   . Myocardial infarction   . Shortness of breath     Past Surgical History  Procedure Date  . Back surgery 1987    Ruptured disk repair  . Pacemaker insertion     with ICD  . Tee without cardioversion 01/17/2011    Procedure: TRANSESOPHAGEAL ECHOCARDIOGRAM (TEE);  Surgeon: Ricki Rodriguez, MD;  Location: [facility] ENDOSCOPY;  Service: Cardiovascular;  Laterality: N/A;  . Cardioversion 01/17/2011    Procedure: CARDIOVERSION;  Surgeon: Ricki Rodriguez, MD;  Location: [facility] ENDOSCOPY;  Service: Cardiovascular;  Laterality: N/A;    Family History  Problem Relation Age  of Onset  . Heart disease    . Heart failure    . Stroke    . Anesthesia problems Neg Hx   . Hypotension Neg Hx   . Malignant hyperthermia Neg Hx   . Pseudochol deficiency Neg Hx     History  Substance Use Topics  . Smoking status: Never Smoker   . Smokeless tobacco: Not on file  . Alcohol Use: No      Review of Systems  Constitutional: Negative for fever, chills, activity change, appetite change and fatigue.  HENT: Positive for nosebleeds. Negative for congestion, sore throat, rhinorrhea, neck pain and neck stiffness.   Eyes: Negative for photophobia, redness and visual disturbance.  Respiratory: Negative for cough, shortness of breath and wheezing.   Cardiovascular: Negative for chest pain, palpitations and leg swelling.  Gastrointestinal: Negative for nausea, vomiting, abdominal pain, diarrhea, constipation and blood in stool.  Genitourinary: Negative for dysuria, urgency, hematuria and flank pain.  Musculoskeletal: Negative for back pain.  Skin: Positive for wound (laceration to tongue). Negative for rash.  Neurological: Negative for dizziness, seizures, facial asymmetry, speech difficulty, weakness, light-headedness, numbness and headaches.  Psychiatric/Behavioral: Negative for confusion.  All other systems reviewed and are negative.    Allergies  Review of patient's allergies indicates no known allergies.  Home Medications   Current Outpatient Rx  Name Route Sig Dispense Refill  . AMIODARONE HCL 200 MG PO TABS Oral Take 200 mg by mouth daily.    . ASPIRIN EC 81 MG PO TBEC Oral  Take 81 mg by mouth daily.    Marland Kitchen CARVEDILOL 6.25 MG PO TABS Oral Take 6.25 mg by mouth 2 (two) times daily with a meal.    . DIGOXIN 0.25 MG PO TABS Oral Take 250 mcg by mouth daily.     . FUROSEMIDE 80 MG PO TABS Oral Take 80 mg by mouth 2 (two) times daily.    Marland Kitchen HYDROCODONE-ACETAMINOPHEN 5-500 MG PO TABS Oral Take 1 tablet by mouth every 6 (six) hours as needed for pain. 10 tablet 0  .  PENICILLIN V POTASSIUM 500 MG PO TABS Oral Take 500 mg by mouth 4 (four) times daily. Starting on 01-18-11 -- for 14 days    . POTASSIUM CHLORIDE CRYS ER 20 MEQ PO TBCR Oral Take 20 mEq by mouth 2 (two) times daily.    Marland Kitchen RAMIPRIL 2.5 MG PO CAPS Oral Take 2.5 mg by mouth daily.    Marland Kitchen ROSUVASTATIN CALCIUM 5 MG PO TABS Oral Take 5 mg by mouth at bedtime.     . SUCRALFATE 1 G PO TABS Oral Take 1 g by mouth 4 (four) times daily -  with meals and at bedtime.     . WARFARIN SODIUM 10 MG PO TABS Oral Take 10 mg by mouth at bedtime.       BP 131/94  Pulse 61  Temp(Src) 98.7 F (37.1 C) (Oral)  Resp 16  SpO2 96%  Physical Exam  Nursing note and vitals reviewed. Constitutional: He is oriented to person, place, and time. He appears well-developed and well-nourished.  Non-toxic appearance. No distress.  HENT:  Head: Normocephalic and atraumatic.  Nose: No sinus tenderness. Epistaxis (dried blood in bilateral nares with erosion medially in right nostril and on lateral aspect of left nostril anteriorly. ) is observed.  Mouth/Throat: Oropharynx is clear and moist.       Tongue laceration appears to be healing. No active bleeding. Sutures still in place.     Eyes: Conjunctivae and EOM are normal. Pupils are equal, round, and reactive to light. No scleral icterus.  Neck: Normal range of motion. Neck supple. No JVD present.  Cardiovascular: Normal rate, regular rhythm, normal heart sounds and intact distal pulses.   No murmur heard. Pulmonary/Chest: Effort normal and breath sounds normal. No respiratory distress. He has no wheezes. He has no rales.  Abdominal: Soft. Bowel sounds are normal. He exhibits no distension. There is no tenderness. There is no rebound and no guarding.  Musculoskeletal: Normal range of motion.  Neurological: He is alert and oriented to person, place, and time. He has normal strength. No cranial nerve deficit. GCS eye subscore is 4. GCS verbal subscore is 5. GCS motor subscore is  6.  Skin: Skin is warm and dry. No rash noted. He is not diaphoretic.  Psychiatric: He has a normal mood and affect.    ED Course  Procedures (including critical care time)  Labs Reviewed  PROTIME-INR - Abnormal; Notable for the following:    Prothrombin Time 23.5 (*)    INR 2.05 (*)    All other components within normal limits  APTT - Abnormal; Notable for the following:    aPTT 41 (*)    All other components within normal limits  URINALYSIS, ROUTINE W REFLEX MICROSCOPIC - Abnormal; Notable for the following:    Color, Urine AMBER (*) BIOCHEMICALS MAY BE AFFECTED BY COLOR   APPearance CLOUDY (*)    Protein, ur 100 (*)    All other components within normal limits  BASIC METABOLIC PANEL - Abnormal; Notable for the following:    Glucose, Bld 116 (*)    BUN 26 (*)    Creatinine, Ser 1.64 (*)    GFR calc non Af Amer 44 (*)    GFR calc Af Amer 51 (*)    All other components within normal limits  URINE MICROSCOPIC-ADD ON - Abnormal; Notable for the following:    Bacteria, UA MANY (*)    All other components within normal limits  CBC   No results found.   1. Tongue laceration   2. Epistaxis       MDM  61yo AAM with PMH significant for CHF, CAD, and atrial fibrillation on coumadin who was seen in the ED 2 days ago by me for tongue laceration from accidentally biting his tongue. His INR was elevated at that time and we had a little bit of difficulty stopping the bleeding although it was just oozing from the laceration. Direct pressure was applied with topical phenylephrine, eventually he received 5mg  oral vitamin K and laceration repair. The bleeding stopped 2 days ago after lac repair and 0.54mL injection of lido with epi. He returns today due to return of bleeding from tongue last night also having bleeding from bilateral nares and thought his urine was pink one time last night. On exam there is no active tongue or nose bleeding. No blood in stool although he says it was maybe a  little dark. Do not think heme checking the stool will be beneficial as melena could be explained by swallowed blood from tongue lac or nose bleeding. Checking INR and CBC. Will also get UA.   INR 2. Hg normal. No bleeding. Discussed with pts cardiologist who is also his PCP will f/u patient as needed. Does not want pt to stop coumadin.   Oral surgery consulted by phone only recs amicar oral rinse and spit hourly will follow up with patient in clinic on Monday.   Pt reassessed after swish has had intermittent mild oozing. No active bleeding. Wants to go home. Will d/c.          Verne Carrow, MD 01/26/11 [phone]

## 2011-01-26 NOTE — ED Notes (Signed)
Pt d/c in NAD. Pt ambulated with quick, steady gait. D/c instructions discussed and pt voiced understanding

## 2011-01-26 NOTE — ED Provider Notes (Signed)
I saw and evaluated the patient, reviewed the resident's note and I agree with the findings and plan.  Jonaven Hilgers, MD 01/26/11 2242 

## 2011-01-26 NOTE — ED Notes (Signed)
To ed for eval of bleeding tongue. Pt has sutures in right side of tongue place 2 days ago. Site started bleeding last night. No airway compromise noted.

## 2011-02-07 ENCOUNTER — Emergency Department (HOSPITAL_COMMUNITY)
Admission: EM | Admit: 2011-02-07 | Discharge: 2011-02-07 | Disposition: A | Discharge status: Home / Self Care | Payer: PRIVATE HEALTH INSURANCE | Attending: Emergency Medicine | Admitting: Emergency Medicine

## 2011-02-07 ENCOUNTER — Encounter (HOSPITAL_COMMUNITY): Payer: Self-pay | Admitting: *Deleted

## 2011-02-07 DIAGNOSIS — Z7901 Long term (current) use of anticoagulants: Secondary | ICD-10-CM | POA: Insufficient documentation

## 2011-02-07 DIAGNOSIS — I5022 Chronic systolic (congestive) heart failure: Secondary | ICD-10-CM | POA: Insufficient documentation

## 2011-02-07 DIAGNOSIS — M25476 Effusion, unspecified foot: Secondary | ICD-10-CM | POA: Insufficient documentation

## 2011-02-07 DIAGNOSIS — D869 Sarcoidosis, unspecified: Secondary | ICD-10-CM | POA: Insufficient documentation

## 2011-02-07 DIAGNOSIS — I4891 Unspecified atrial fibrillation: Secondary | ICD-10-CM | POA: Insufficient documentation

## 2011-02-07 DIAGNOSIS — I129 Hypertensive chronic kidney disease with stage 1 through stage 4 chronic kidney disease, or unspecified chronic kidney disease: Secondary | ICD-10-CM | POA: Insufficient documentation

## 2011-02-07 DIAGNOSIS — Z7982 Long term (current) use of aspirin: Secondary | ICD-10-CM | POA: Insufficient documentation

## 2011-02-07 DIAGNOSIS — M25473 Effusion, unspecified ankle: Secondary | ICD-10-CM | POA: Insufficient documentation

## 2011-02-07 DIAGNOSIS — M7989 Other specified soft tissue disorders: Secondary | ICD-10-CM | POA: Insufficient documentation

## 2011-02-07 DIAGNOSIS — Z950 Presence of cardiac pacemaker: Secondary | ICD-10-CM | POA: Insufficient documentation

## 2011-02-07 DIAGNOSIS — E7800 Pure hypercholesterolemia, unspecified: Secondary | ICD-10-CM | POA: Insufficient documentation

## 2011-02-07 DIAGNOSIS — I2510 Atherosclerotic heart disease of native coronary artery without angina pectoris: Secondary | ICD-10-CM | POA: Insufficient documentation

## 2011-02-07 DIAGNOSIS — N183 Chronic kidney disease, stage 3 unspecified: Secondary | ICD-10-CM | POA: Insufficient documentation

## 2011-02-07 DIAGNOSIS — M109 Gout, unspecified: Secondary | ICD-10-CM | POA: Insufficient documentation

## 2011-02-07 DIAGNOSIS — Z79899 Other long term (current) drug therapy: Secondary | ICD-10-CM | POA: Insufficient documentation

## 2011-02-07 DIAGNOSIS — K219 Gastro-esophageal reflux disease without esophagitis: Secondary | ICD-10-CM | POA: Insufficient documentation

## 2011-02-07 DIAGNOSIS — I509 Heart failure, unspecified: Secondary | ICD-10-CM | POA: Insufficient documentation

## 2011-02-07 DIAGNOSIS — M79609 Pain in unspecified limb: Secondary | ICD-10-CM | POA: Insufficient documentation

## 2011-02-07 DIAGNOSIS — I252 Old myocardial infarction: Secondary | ICD-10-CM | POA: Insufficient documentation

## 2011-02-07 MED ORDER — OXYCODONE-ACETAMINOPHEN 5-325 MG PO TABS: 1.0000 | ORAL_TABLET | Freq: Four times a day (QID) | ORAL | Status: AC | PRN

## 2011-02-07 MED ORDER — HYDROMORPHONE HCL PF 2 MG/ML IJ SOLN
2.0000 mg | Freq: Once | INTRAMUSCULAR | Status: AC
  Administered 2011-02-07: 2 mg via INTRAMUSCULAR
  Filled 2011-02-07: qty 1

## 2011-02-07 MED ORDER — HYDROMORPHONE HCL PF 1 MG/ML IJ SOLN: 1.0000 mg | Freq: Once | INTRAMUSCULAR | Status: DC

## 2011-02-07 NOTE — ED Provider Notes (Signed)
History     CSN: [id number]  Arrival date & time 02/07/11  1232   First MD Initiated Contact with Patient 02/07/11 1348      Chief Complaint  Patient presents with  . Foot Pain    right    (Consider location/radiation/quality/duration/timing/severity/associated sxs/prior treatment) Patient is a 61 y.o. male presenting with lower extremity pain. The history is provided by the patient.  Foot Pain This is a new problem. The current episode started 2 days ago. The problem occurs constantly. The problem has been gradually worsening. Associated symptoms comments: No fever, injury, other issues. The symptoms are aggravated by walking and standing. The symptoms are relieved by nothing. He has tried nothing for the symptoms. The treatment provided no relief.    Past Medical History  Diagnosis Date  . Hypertension   . CHF (congestive heart failure)   . Sarcoidosis   . Cardiomyopathy     non ischemic by cath  . Acute on chronic systolic heart failure   . Automatic implantable cardiac defibrillator in situ   . Atrial fibrillation   . NSVT (nonsustained ventricular tachycardia)   . Chronic kidney disease (CKD), stage III (moderate)   . GERD (gastroesophageal reflux disease)   . Hypercholesteremia   . Coronary artery disease   . Myocardial infarction   . Shortness of breath     Past Surgical History  Procedure Date  . Back surgery 1987    Ruptured disk repair  . Pacemaker insertion     with ICD  . Tee without cardioversion 01/17/2011    Procedure: TRANSESOPHAGEAL ECHOCARDIOGRAM (TEE);  Surgeon: Ricki Rodriguez, MD;  Location: [facility] ENDOSCOPY;  Service: Cardiovascular;  Laterality: N/A;  . Cardioversion 01/17/2011    Procedure: CARDIOVERSION;  Surgeon: Ricki Rodriguez, MD;  Location: [facility] ENDOSCOPY;  Service: Cardiovascular;  Laterality: N/A;    Family History  Problem Relation Age of Onset  . Heart disease    . Heart failure    . Stroke    . Anesthesia problems Neg Hx   .  Hypotension Neg Hx   . Malignant hyperthermia Neg Hx   . Pseudochol deficiency Neg Hx     History  Substance Use Topics  . Smoking status: Never Smoker   . Smokeless tobacco: Not on file  . Alcohol Use: No      Review of Systems  Constitutional: Negative for fever.  Cardiovascular: Positive for leg swelling.  Musculoskeletal: Positive for joint swelling.  All other systems reviewed and are negative.    Allergies  Review of patient's allergies indicates no known allergies.  Home Medications   Current Outpatient Rx  Name Route Sig Dispense Refill  . AMIODARONE HCL 200 MG PO TABS Oral Take 200 mg by mouth daily.    . ASPIRIN EC 81 MG PO TBEC Oral Take 81 mg by mouth daily.    Marland Kitchen CARVEDILOL 6.25 MG PO TABS Oral Take 6.25 mg by mouth 2 (two) times daily with a meal.    . DIGOXIN 0.25 MG PO TABS Oral Take 250 mcg by mouth daily.     . FUROSEMIDE 80 MG PO TABS Oral Take 80 mg by mouth 2 (two) times daily.    Marland Kitchen POTASSIUM CHLORIDE CRYS ER 20 MEQ PO TBCR Oral Take 20 mEq by mouth 2 (two) times daily.    Marland Kitchen RAMIPRIL 2.5 MG PO CAPS Oral Take 2.5 mg by mouth daily.    Marland Kitchen ROSUVASTATIN CALCIUM 5 MG PO TABS Oral Take 5 mg  by mouth at bedtime.     . SUCRALFATE 1 G PO TABS Oral Take 1 g by mouth 4 (four) times daily -  with meals and at bedtime.     . WARFARIN SODIUM 10 MG PO TABS Oral Take 10 mg by mouth at bedtime.       BP 119/88  Pulse 76  Temp(Src) 98.2 F (36.8 C) (Oral)  Resp 18  SpO2 96%  Physical Exam  Nursing note and vitals reviewed. Constitutional: He is oriented to person, place, and time. He appears well-developed and well-nourished. He appears distressed.  HENT:  Head: Normocephalic and atraumatic.  Musculoskeletal:       Right foot: He exhibits bony tenderness.       Feet:  Neurological: He is alert and oriented to person, place, and time.  Skin: Skin is warm and dry.    ED Course  Procedures (including critical care time)  Labs Reviewed - No data to  display No results found.   No diagnosis found.    MDM   Patient with right MTP joint pain consistent with gout. Started 2 days ago he has warmth swelling and pain over that area of his foot. He's had similar episodes in the past. However patient is on Coumadin and has a history of chronic kidney disease he is not a candidate for colchicine or indomethacin. In the past he's only been able to receive pain medications. Also he cannot get steroids due to being on Coumadin. Will give IM pain medication and oral pain medication.  3:16 PM After pain medication patient feeling much better. Will discharge home with pain medication and followup with history her doctor.       Gwyneth Sprout, MD 02/07/11 [phone]

## 2011-02-07 NOTE — Progress Notes (Signed)
Orthopedic Tech Progress Note Patient Details:  [name] 1950/05/23 [id number]  Other Ortho Devices Ortho Device Location: cam walker Ortho Device Interventions: Application   Cammer, Mickie Bail 02/07/2011, 3:15 PM

## 2011-02-07 NOTE — ED Notes (Signed)
Pt thinks he is having gout flare up in right foot

## 2011-02-13 IMAGING — CT CT HEAD W/O CM
3 of 4 series · 16 of 40 positions shown, 19 images · non-contrast
Comparison: Head CT 07/03/2009.  Cervical spine radiographs
03/07/2009.

CT HEAD

CLINICAL DATA: Headache and right-sided neck pain.  Loss of
consciousness.  Head injury.

CT HEAD WITHOUT CONTRAST
CT CERVICAL SPINE WITHOUT CONTRAST
TECHNIQUE: Multidetector CT imaging of the head and cervical spine
was performed following the standard protocol without intravenous
contrast.  Multiplanar CT image reconstructions of the cervical
spine were also generated.

[Series 3: head_seq 4.5 h37s st · axial · 0.43mm/px · z∈[-93,-10]mm · 3 of 36 slices shown]
[im 9/36  brain]
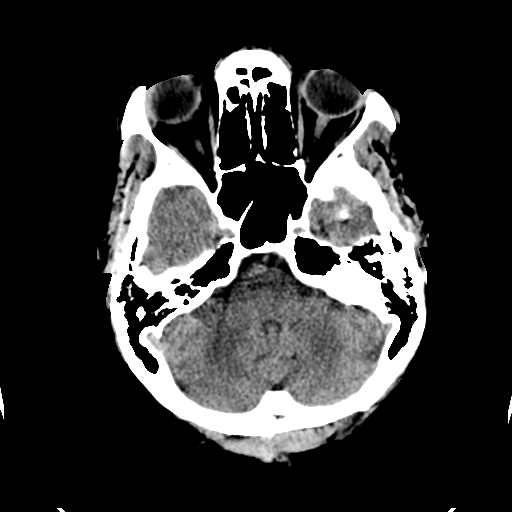
[im 18/36  brain]
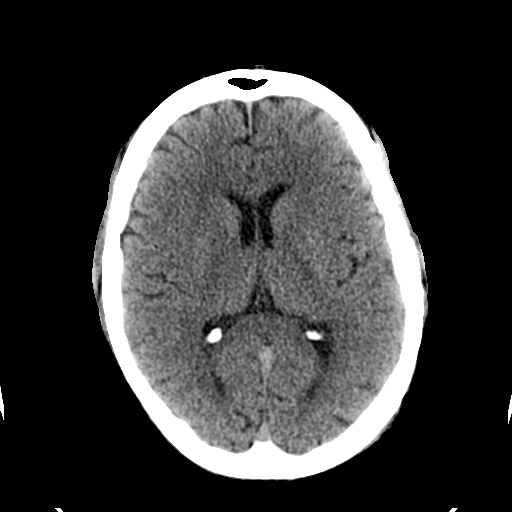
[im 27/36  brain]
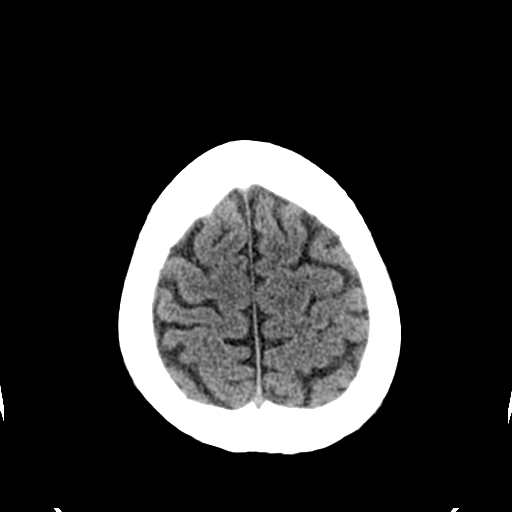

[Series 602: <mpr thick range> · axial · 0.34mm/px · z∈[-323,-185]mm · 10 of 87 slices shown, 13 images]
[im 8/87  brain]
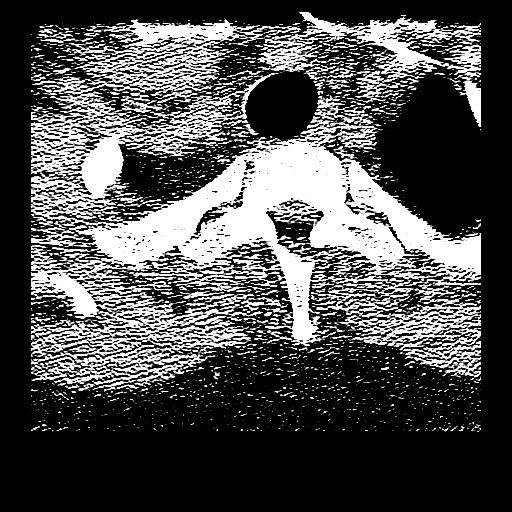
[im 8/87  bone]
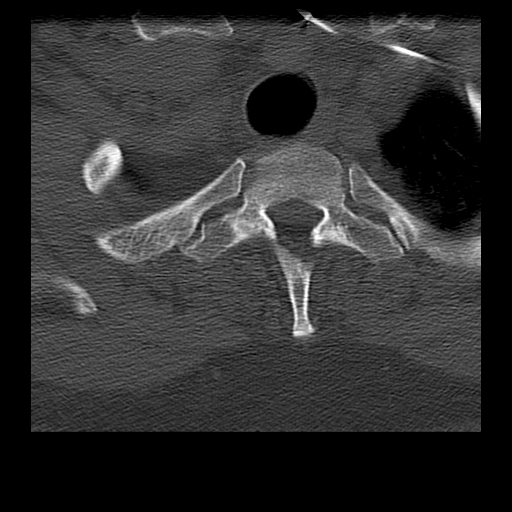
[im 16/87  brain]
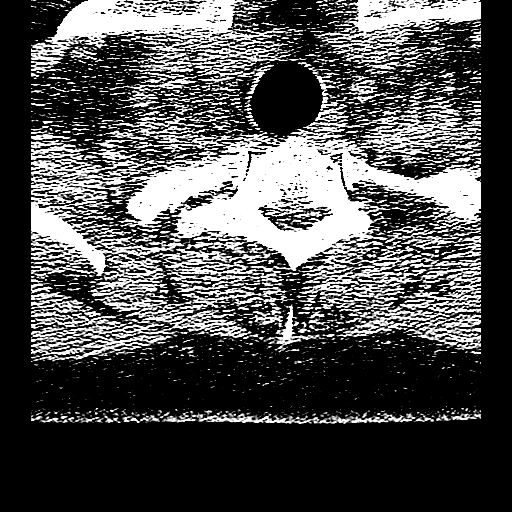
[im 24/87  brain]
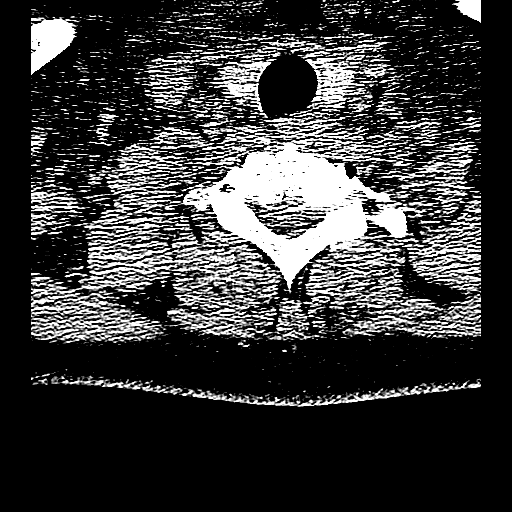
[im 32/87  brain]
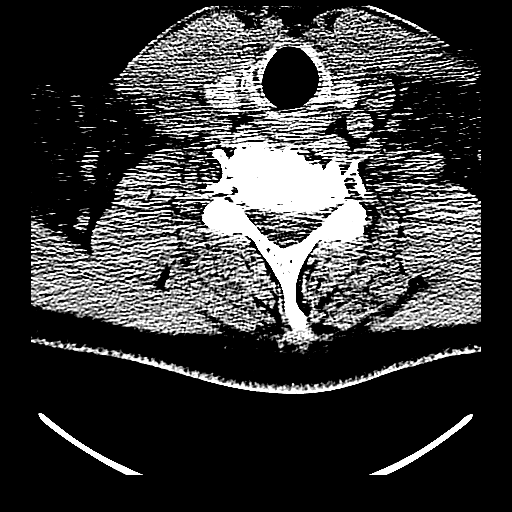
[im 40/87  brain]
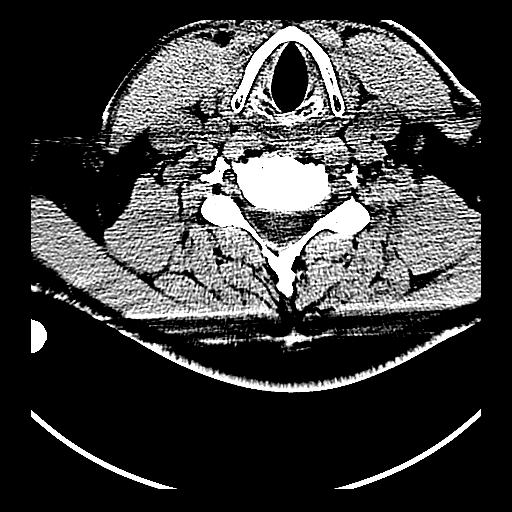
[im 40/87  bone]
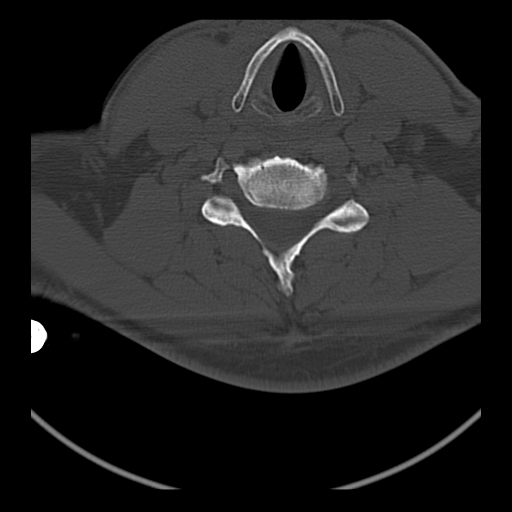
[im 47/87  brain]
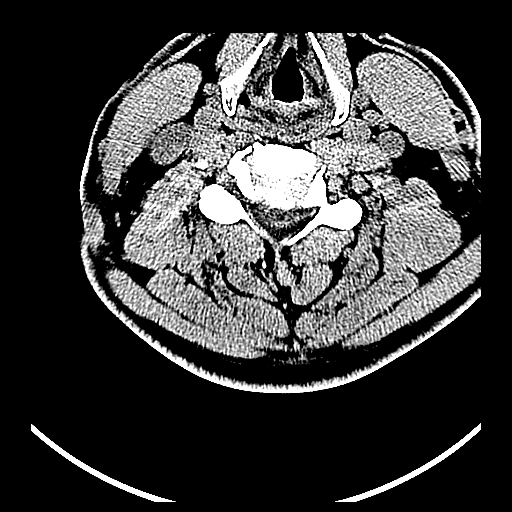
[im 55/87  brain]
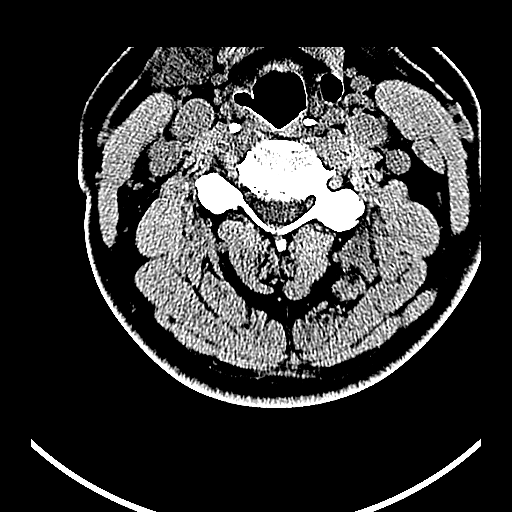
[im 63/87  brain]
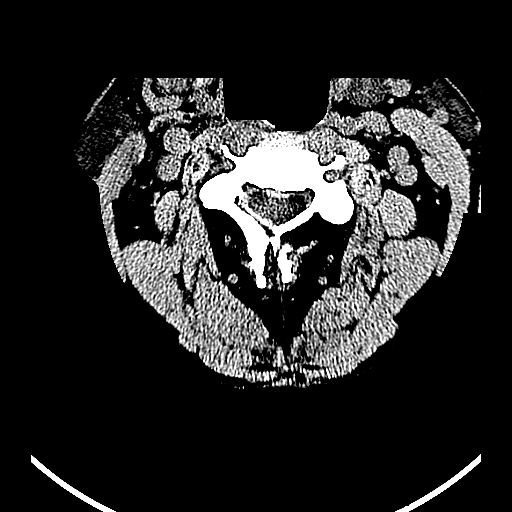
[im 71/87  brain]
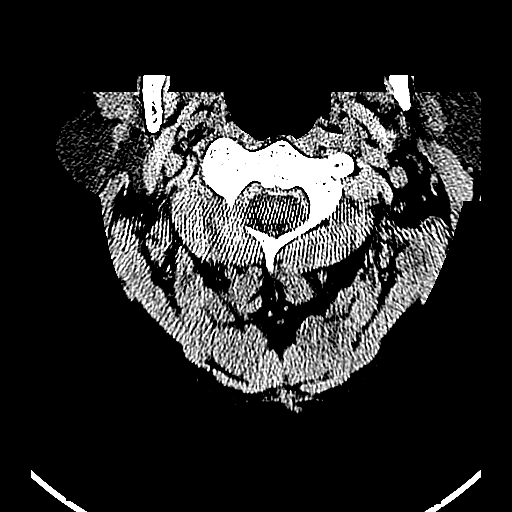
[im 71/87  bone]
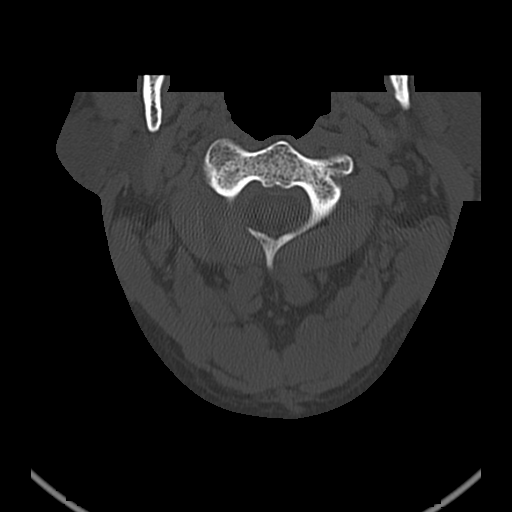
[im 79/87  brain]
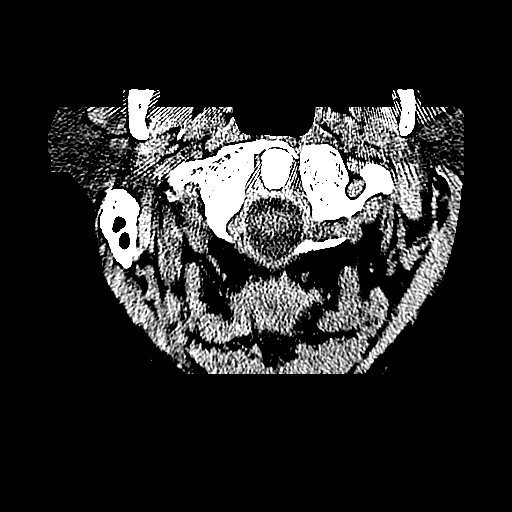

[Series 603: <mpr thick range(1)> · coronal · 0.34mm/px · 3 of 42 slices shown]
[im 14/42  brain]
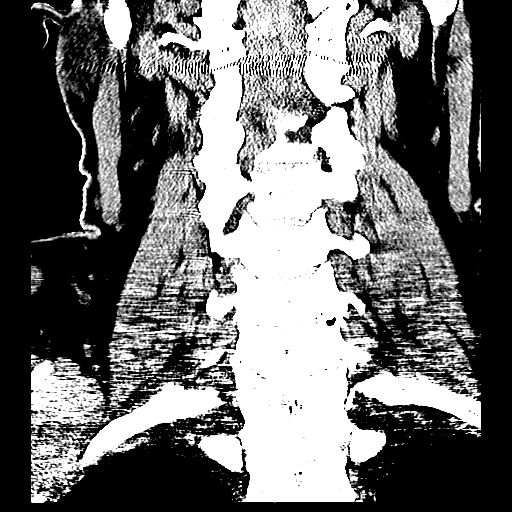
[im 19/42  brain]
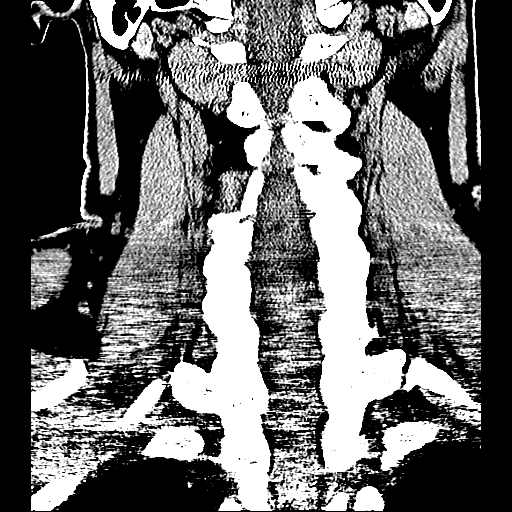
[im 23/42  brain]
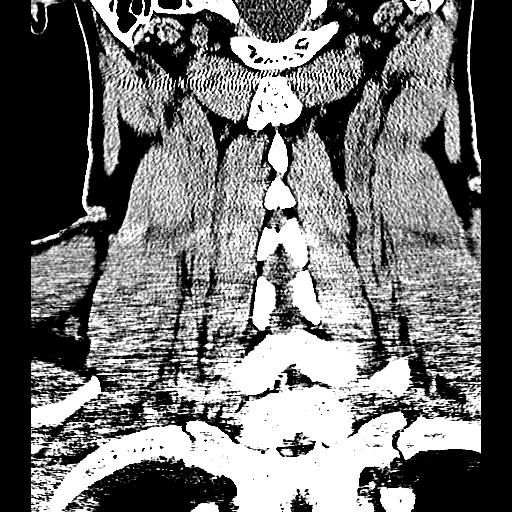

[16 of 40 positions shown; findings below may reference images not displayed]

FINDINGS: There is no evidence of acute intracranial hemorrhage,
mass lesion, brain edema or extra-axial fluid collection.  The
ventricles and subarachnoid spaces are appropriately sized for age.
There is no CT evidence of acute cortical infarction.  There are
stable calcifications within the basal ganglia bilaterally.

The visualized paranasal sinuses are clear. The calvarium is
intact. There is a stable osseous septation in the right maxillary
sinus.
IMPRESSION: Stable examination.  No acute intracranial or calvarial findings.

CT CERVICAL SPINE
FINDINGS: There is reversal of the usual cervical lordosis.  There
is no focal angulation or listhesis.  There is no evidence of acute
fracture.

There is multilevel spondylosis with disc bulging and osteophytes
most advanced at C6-C7.  Central disc protrusions are present at
multiple levels.  No acute soft tissue findings are demonstrated.
IMPRESSION: 1.  Negative for acute cervical spine fracture, subluxation or
static signs of instability.
2.  Diffuse spondylosis, similar to prior radiographs.

## 2011-02-17 IMAGING — CR DG CHEST 2V
2 series · 2 of 2 positions shown · non-contrast
Comparison: 07/03/2009

CLINICAL DATA: Shortness of breath.  Chest pain.  Hypertension.

CHEST - 2 VIEW

[w chest pa]
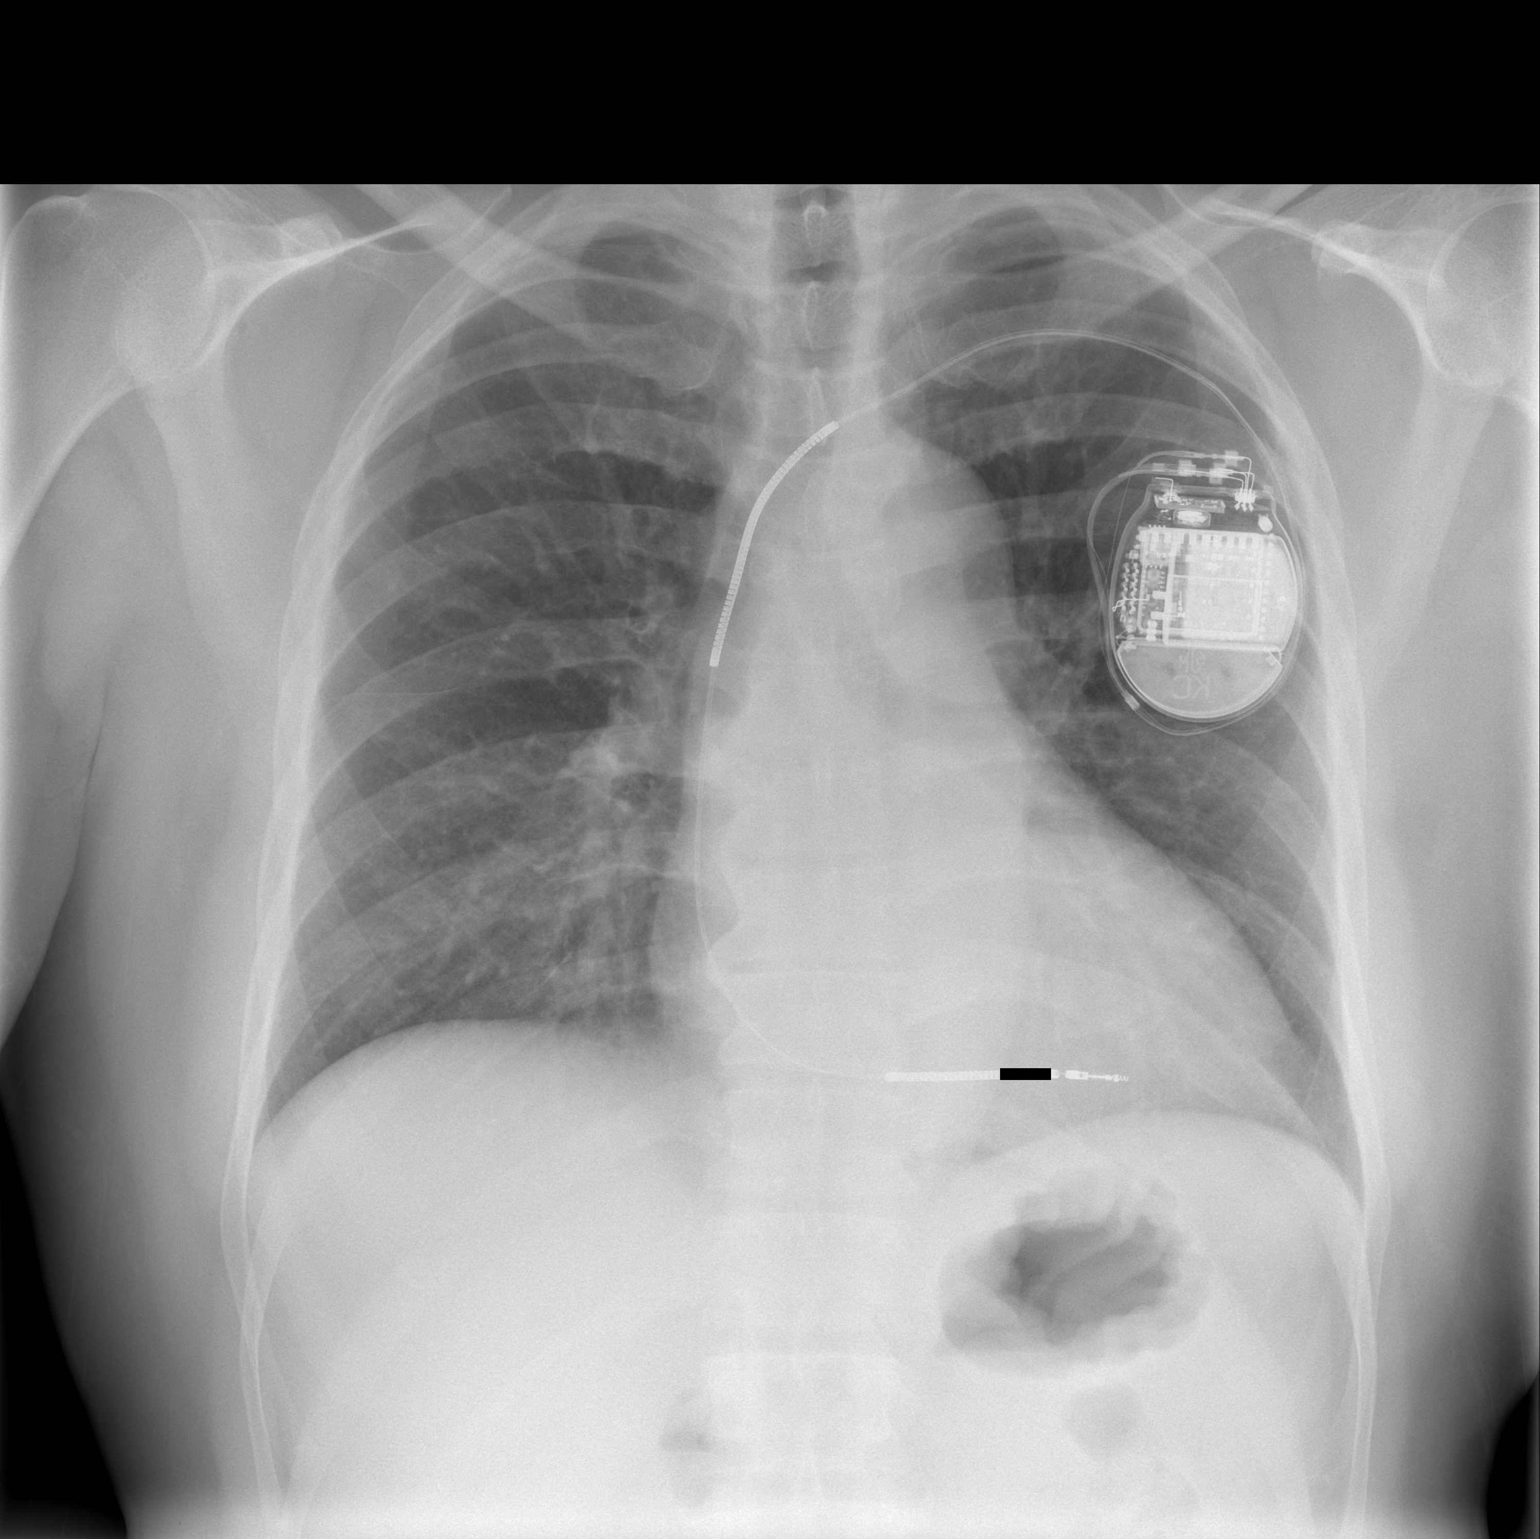

[w chest lat]
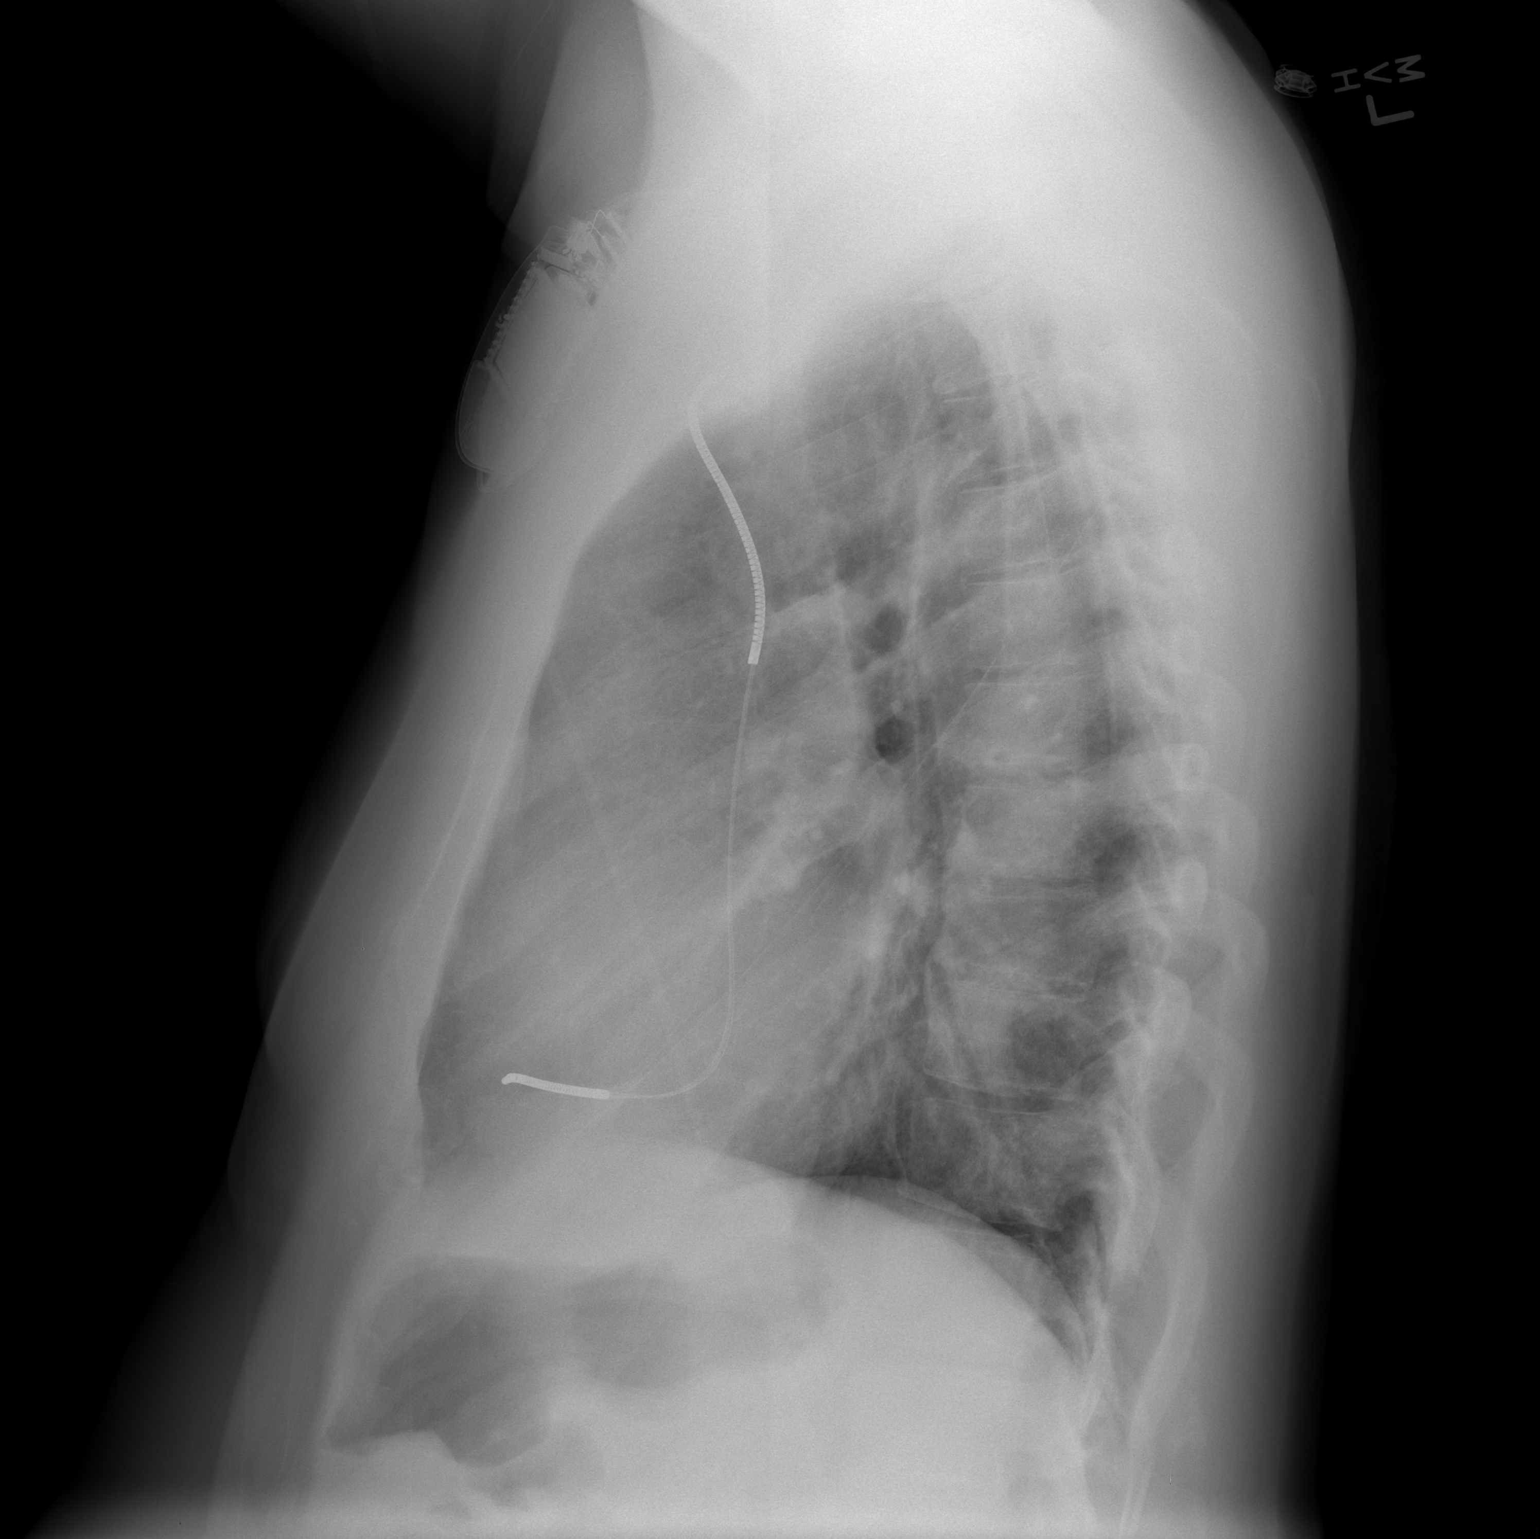

[2 of 2 positions shown; findings below may reference images not displayed]

FINDINGS: A single lead AICD is in place.  Heart is mildly
enlarged.  Chronic interstitial coarsening is stable.  There is no
evidence for edema or effusions to suggest failure.  No focal
airspace disease is evident.
IMPRESSION: 1.  Stable cardiomegaly without failure.
2.  Mild chronic interstitial coarsening.

## 2011-02-19 ENCOUNTER — Emergency Department (HOSPITAL_COMMUNITY): Payer: PRIVATE HEALTH INSURANCE

## 2011-02-19 ENCOUNTER — Encounter (HOSPITAL_COMMUNITY): Payer: Self-pay | Admitting: Family Medicine

## 2011-02-19 ENCOUNTER — Inpatient Hospital Stay (HOSPITAL_COMMUNITY)
Admission: EM | Admit: 2011-02-19 | Discharge: 2011-02-23 | DRG: 292 | Disposition: A | Discharge status: Home / Self Care | Payer: PRIVATE HEALTH INSURANCE | Attending: Cardiology | Admitting: Cardiology

## 2011-02-19 ENCOUNTER — Other Ambulatory Visit: Payer: Self-pay

## 2011-02-19 DIAGNOSIS — I129 Hypertensive chronic kidney disease with stage 1 through stage 4 chronic kidney disease, or unspecified chronic kidney disease: Secondary | ICD-10-CM | POA: Diagnosis present

## 2011-02-19 DIAGNOSIS — I428 Other cardiomyopathies: Secondary | ICD-10-CM | POA: Diagnosis present

## 2011-02-19 DIAGNOSIS — I5023 Acute on chronic systolic (congestive) heart failure: Principal | ICD-10-CM | POA: Diagnosis present

## 2011-02-19 DIAGNOSIS — R0600 Dyspnea, unspecified: Secondary | ICD-10-CM

## 2011-02-19 DIAGNOSIS — Z95810 Presence of automatic (implantable) cardiac defibrillator: Secondary | ICD-10-CM

## 2011-02-19 DIAGNOSIS — I2510 Atherosclerotic heart disease of native coronary artery without angina pectoris: Secondary | ICD-10-CM | POA: Diagnosis present

## 2011-02-19 DIAGNOSIS — K219 Gastro-esophageal reflux disease without esophagitis: Secondary | ICD-10-CM | POA: Diagnosis present

## 2011-02-19 DIAGNOSIS — I4891 Unspecified atrial fibrillation: Secondary | ICD-10-CM | POA: Diagnosis present

## 2011-02-19 DIAGNOSIS — I509 Heart failure, unspecified: Secondary | ICD-10-CM | POA: Diagnosis present

## 2011-02-19 DIAGNOSIS — Z8249 Family history of ischemic heart disease and other diseases of the circulatory system: Secondary | ICD-10-CM

## 2011-02-19 DIAGNOSIS — N184 Chronic kidney disease, stage 4 (severe): Secondary | ICD-10-CM | POA: Diagnosis present

## 2011-02-19 LAB — CBC
HCT: 39.7 % (ref 39.0–52.0)
Hemoglobin: 13.7 g/dL (ref 13.0–17.0)
MCH: 30 pg (ref 26.0–34.0)
MCHC: 34 g/dL (ref 30.0–36.0)
MCHC: 34.5 g/dL (ref 30.0–36.0)
MCV: 88.2 fL (ref 78.0–100.0)
Platelets: 229 10*3/uL (ref 150–400)
RBC: 4.57 MIL/uL (ref 4.22–5.81)
RDW: 13.1 % (ref 11.5–15.5)
WBC: 6 10*3/uL (ref 4.0–10.5)

## 2011-02-19 LAB — COMPREHENSIVE METABOLIC PANEL
BUN: 30 mg/dL — ABNORMAL HIGH (ref 6–23)
Calcium: 10 mg/dL (ref 8.4–10.5)
GFR calc Af Amer: 54 mL/min — ABNORMAL LOW (ref >90)
Glucose, Bld: 96 mg/dL (ref 70–99)
Sodium: 142 mEq/L (ref 135–145)
Total Protein: 7.4 g/dL (ref 6.0–8.3)

## 2011-02-19 LAB — CREATININE, SERUM
GFR calc Af Amer: 60 mL/min — ABNORMAL LOW (ref >90)
GFR calc non Af Amer: 51 mL/min — ABNORMAL LOW (ref >90)

## 2011-02-19 LAB — POCT I-STAT TROPONIN I

## 2011-02-19 LAB — DIGOXIN LEVEL: Digoxin Level: 0.3 ng/mL — ABNORMAL LOW (ref 0.8–2.0)

## 2011-02-19 LAB — CARDIAC PANEL(CRET KIN+CKTOT+MB+TROPI)
CK, MB: 4.9 ng/mL — ABNORMAL HIGH (ref 0.3–4.0)
Total CK: 199 U/L (ref 7–232)
Troponin I: <0.3 ng/mL (ref <0.30)

## 2011-02-19 LAB — PRO B NATRIURETIC PEPTIDE: Pro B Natriuretic peptide (BNP): 5177 pg/mL — ABNORMAL HIGH (ref 0–125)

## 2011-02-19 MED ORDER — ASPIRIN 325 MG PO TABS
325.0000 mg | ORAL_TABLET | ORAL | Status: AC
  Administered 2011-02-19: 325 mg via ORAL
  Filled 2011-02-19: qty 1

## 2011-02-19 MED ORDER — HEPARIN SODIUM (PORCINE) 5000 UNIT/ML IJ SOLN
5000.0000 [IU] | Freq: Three times a day (TID) | INTRAMUSCULAR | Status: DC
  Administered 2011-02-20 (×2): 5000 [IU] via SUBCUTANEOUS
  Filled 2011-02-19 (×5): qty 1

## 2011-02-19 MED ORDER — WARFARIN SODIUM 10 MG PO TABS
10.0000 mg | ORAL_TABLET | ORAL | Status: AC
  Administered 2011-02-20: 10 mg via ORAL
  Filled 2011-02-19: qty 1

## 2011-02-19 MED ORDER — SODIUM CHLORIDE 0.9 % IV SOLN: 250.0000 mL | INTRAVENOUS | Status: DC | PRN

## 2011-02-19 MED ORDER — SODIUM CHLORIDE 0.9 % IJ SOLN
3.0000 mL | Freq: Two times a day (BID) | INTRAMUSCULAR | Status: DC
  Administered 2011-02-20 – 2011-02-21 (×3): 3 mL via INTRAVENOUS

## 2011-02-19 MED ORDER — FUROSEMIDE 10 MG/ML IJ SOLN
80.0000 mg | Freq: Two times a day (BID) | INTRAMUSCULAR | Status: DC
  Administered 2011-02-20 – 2011-02-23 (×7): 80 mg via INTRAVENOUS
  Filled 2011-02-19 (×9): qty 8

## 2011-02-19 MED ORDER — ONDANSETRON HCL 4 MG/2ML IJ SOLN: 4.0000 mg | Freq: Four times a day (QID) | INTRAMUSCULAR | Status: DC | PRN

## 2011-02-19 MED ORDER — DIGOXIN 250 MCG PO TABS
0.2500 mg | ORAL_TABLET | Freq: Every day | ORAL | Status: DC
  Administered 2011-02-20 – 2011-02-23 (×5): 0.25 mg via ORAL
  Filled 2011-02-19 (×5): qty 1

## 2011-02-19 MED ORDER — MORPHINE SULFATE 2 MG/ML IJ SOLN
2.0000 mg | Freq: Once | INTRAMUSCULAR | Status: AC
  Administered 2011-02-19: 2 mg via INTRAVENOUS
  Filled 2011-02-19: qty 1

## 2011-02-19 MED ORDER — ASPIRIN EC 81 MG PO TBEC
81.0000 mg | DELAYED_RELEASE_TABLET | Freq: Every day | ORAL | Status: DC
  Administered 2011-02-20 – 2011-02-23 (×5): 81 mg via ORAL
  Filled 2011-02-19 (×5): qty 1

## 2011-02-19 MED ORDER — ROSUVASTATIN CALCIUM 5 MG PO TABS
5.0000 mg | ORAL_TABLET | Freq: Every day | ORAL | Status: DC
  Administered 2011-02-20 – 2011-02-22 (×4): 5 mg via ORAL
  Filled 2011-02-19 (×5): qty 1

## 2011-02-19 MED ORDER — CARVEDILOL 6.25 MG PO TABS
6.2500 mg | ORAL_TABLET | Freq: Two times a day (BID) | ORAL | Status: DC
  Administered 2011-02-20 – 2011-02-23 (×8): 6.25 mg via ORAL
  Filled 2011-02-19 (×9): qty 1

## 2011-02-19 MED ORDER — ACETAMINOPHEN 325 MG PO TABS
650.0000 mg | ORAL_TABLET | ORAL | Status: DC | PRN
  Administered 2011-02-20: 650 mg via ORAL
  Filled 2011-02-19: qty 2

## 2011-02-19 MED ORDER — SODIUM CHLORIDE 0.9 % IJ SOLN: 3.0000 mL | INTRAMUSCULAR | Status: DC | PRN

## 2011-02-19 MED ORDER — RAMIPRIL 2.5 MG PO CAPS
2.5000 mg | ORAL_CAPSULE | Freq: Every day | ORAL | Status: DC
  Administered 2011-02-20 – 2011-02-23 (×5): 2.5 mg via ORAL
  Filled 2011-02-19 (×5): qty 1

## 2011-02-19 NOTE — ED Notes (Signed)
Per pt, started having increased SOB and chest pain that started last night. sts feel like CHF. sts chest pain in the middle of his chest, non-radiating. Slight swelling in foot, hand, and stomach.

## 2011-02-19 NOTE — ED Notes (Signed)
Pt pain free after morphine was given.  Attempt to call report to [phone]

## 2011-02-19 NOTE — ED Notes (Signed)
Paged [name]

## 2011-02-19 NOTE — Progress Notes (Signed)
ANTICOAGULATION CONSULT NOTE - Initial Consult  Pharmacy Consult for Coumadin Indication: atrial fibrillation  No Known Allergies  Patient Measurements: Height: 6\' 3"  (190.5 cm) Weight: 234 lb 2.1 oz (106.2 kg) IBW/kg (Calculated) : 84.5  Heparin Dosing Weight: n/a  Vital Signs: Temp: 97.5 F (36.4 C) (02/17 2107) Temp src: Oral (02/17 2107) BP: 154/84 mmHg (02/17 2115) Pulse Rate: 86  (02/17 2107)  Labs:  Basename 02/19/11 1630 02/19/11 1621  HGB 13.7 --  HCT 40.3 --  PLT 229 --  APTT -- --  LABPROT 13.9 --  INR 1.05 --  HEPARINUNFRC -- --  CREATININE -- 1.55*  CKTOTAL -- --  CKMB -- --  TROPONINI -- --   Estimated Creatinine Clearance: 66 ml/min (by C-G formula based on Cr of 1.55).  Medical History: Past Medical History  Diagnosis Date  . Hypertension   . CHF (congestive heart failure)   . Sarcoidosis   . Cardiomyopathy     non ischemic by cath  . Acute on chronic systolic heart failure   . Automatic implantable cardiac defibrillator in situ   . Atrial fibrillation   . NSVT (nonsustained ventricular tachycardia)   . Chronic kidney disease (CKD), stage III (moderate)   . GERD (gastroesophageal reflux disease)   . Hypercholesteremia   . Coronary artery disease   . Myocardial infarction   . Shortness of breath     Medications:  Prescriptions prior to admission  Medication Sig Dispense Refill  . amiodarone (PACERONE) 200 MG tablet Take 200 mg by mouth daily.      Marland Kitchen aspirin EC 81 MG tablet Take 81 mg by mouth daily.      . carvedilol (COREG) 6.25 MG tablet Take 6.25 mg by mouth 2 (two) times daily with a meal.      . digoxin (LANOXIN) 0.25 MG tablet Take 250 mcg by mouth daily.       . furosemide (LASIX) 80 MG tablet Take 80 mg by mouth 2 (two) times daily.      . potassium chloride SA (K-DUR,KLOR-CON) 20 MEQ tablet Take 20 mEq by mouth 2 (two) times daily.      . ramipril (ALTACE) 2.5 MG capsule Take 2.5 mg by mouth daily.      . rosuvastatin  (CRESTOR) 5 MG tablet Take 5 mg by mouth at bedtime.       . sucralfate (CARAFATE) 1 G tablet Take 1 g by mouth 4 (four) times daily -  with meals and at bedtime.       Marland Kitchen warfarin (COUMADIN) 10 MG tablet Take 10 mg by mouth at bedtime.         Assessment: 61 yo male on chronic Coumadin for afib.  Home dose 10 mg daily, last dose unknown.  INR subtherapeutic today.  Goal of Therapy:  INR 2-3   Plan:  1. Coumadin 12.5 mg po x 1 tonight. 2. Daily INR.  Seri Kimmer, Gwenlyn Found 02/19/2011,9:49 PM

## 2011-02-19 NOTE — ED Provider Notes (Signed)
History     CSN: [id number]  Arrival date & time 02/19/11  1545   First MD Initiated Contact with Patient 02/19/11 1613      Chief Complaint  Patient presents with  . Shortness of Breath  . Chest Pain    (Consider location/radiation/quality/duration/timing/severity/associated sxs/prior treatment) Patient is a 61 y.o. male presenting with shortness of breath. The history is provided by the patient.  Shortness of Breath  The problem occurs occasionally. The problem has been gradually worsening. The problem is moderate. The symptoms are relieved by nothing. The symptoms are aggravated by activity and a supine position. Associated symptoms include chest pressure (when lying flat) and shortness of breath. Pertinent negatives include no chest pain, no fever, no rhinorrhea and no sore throat. Associated symptoms comments: Feels distended in his abdomen. He has had prior hospitalizations. There were no sick contacts.    Past Medical History  Diagnosis Date  . Hypertension   . CHF (congestive heart failure)   . Sarcoidosis   . Cardiomyopathy     non ischemic by cath  . Acute on chronic systolic heart failure   . Automatic implantable cardiac defibrillator in situ   . Atrial fibrillation   . NSVT (nonsustained ventricular tachycardia)   . Chronic kidney disease (CKD), stage III (moderate)   . GERD (gastroesophageal reflux disease)   . Hypercholesteremia   . Coronary artery disease   . Myocardial infarction   . Shortness of breath     Past Surgical History  Procedure Date  . Back surgery 1987    Ruptured disk repair  . Pacemaker insertion     with ICD  . Tee without cardioversion 01/17/2011    Procedure: TRANSESOPHAGEAL ECHOCARDIOGRAM (TEE);  Surgeon: Ricki Rodriguez, MD;  Location: [facility] ENDOSCOPY;  Service: Cardiovascular;  Laterality: N/A;  . Cardioversion 01/17/2011    Procedure: CARDIOVERSION;  Surgeon: Ricki Rodriguez, MD;  Location: [facility] ENDOSCOPY;  Service: Cardiovascular;   Laterality: N/A;    Family History  Problem Relation Age of Onset  . Heart disease    . Heart failure    . Stroke    . Anesthesia problems Neg Hx   . Hypotension Neg Hx   . Malignant hyperthermia Neg Hx   . Pseudochol deficiency Neg Hx     History  Substance Use Topics  . Smoking status: Never Smoker   . Smokeless tobacco: Not on file  . Alcohol Use: No      Review of Systems  Constitutional: Negative for fever and chills.  HENT: Negative for congestion, sore throat, rhinorrhea and neck pain.   Eyes: Negative for pain, redness and visual disturbance.  Respiratory: Positive for shortness of breath. Negative for chest tightness.   Cardiovascular: Negative for chest pain and leg swelling.  Gastrointestinal: Negative for nausea, vomiting, abdominal pain, diarrhea and constipation.  Genitourinary: Negative for dysuria and difficulty urinating.  Musculoskeletal: Negative for back pain and arthralgias.  Skin: Negative for rash and wound.  Neurological: Negative for dizziness, weakness and headaches.  Psychiatric/Behavioral: Negative for confusion and dysphoric mood.  All other systems reviewed and are negative.    Allergies  Review of patient's allergies indicates no known allergies.  Home Medications   Current Outpatient Rx  Name Route Sig Dispense Refill  . AMIODARONE HCL 200 MG PO TABS Oral Take 200 mg by mouth daily.    . ASPIRIN EC 81 MG PO TBEC Oral Take 81 mg by mouth daily.    Marland Kitchen CARVEDILOL 6.25 MG  PO TABS Oral Take 6.25 mg by mouth 2 (two) times daily with a meal.    . DIGOXIN 0.25 MG PO TABS Oral Take 250 mcg by mouth daily.     . FUROSEMIDE 80 MG PO TABS Oral Take 80 mg by mouth 2 (two) times daily.    Marland Kitchen POTASSIUM CHLORIDE CRYS ER 20 MEQ PO TBCR Oral Take 20 mEq by mouth 2 (two) times daily.    Marland Kitchen RAMIPRIL 2.5 MG PO CAPS Oral Take 2.5 mg by mouth daily.    Marland Kitchen ROSUVASTATIN CALCIUM 5 MG PO TABS Oral Take 5 mg by mouth at bedtime.     . SUCRALFATE 1 G PO TABS Oral  Take 1 g by mouth 4 (four) times daily -  with meals and at bedtime.     . WARFARIN SODIUM 10 MG PO TABS Oral Take 10 mg by mouth at bedtime.       BP 134/94  Pulse 54  Temp 98.3 F (36.8 C)  Resp 21  SpO2 100%  Physical Exam  Constitutional: He is oriented to person, place, and time. He appears well-developed and well-nourished. No distress.  HENT:  Head: Normocephalic and atraumatic.  Right Ear: External ear normal.  Left Ear: External ear normal.  Mouth/Throat: Oropharynx is clear and moist.  Eyes: Pupils are equal, round, and reactive to light.  Neck: Normal range of motion. Neck supple.  Cardiovascular: Normal rate, regular rhythm, normal heart sounds and intact distal pulses.  Exam reveals no gallop and no friction rub.   No murmur heard.      Pacemaker scar at left chest  Pulmonary/Chest: Breath sounds normal. No respiratory distress. He has no wheezes. He has no rales.       Tachypnea  Abdominal: Soft. He exhibits distension (mild distention, but not tender). There is no tenderness. There is no rebound and no guarding.  Musculoskeletal: Normal range of motion. He exhibits no edema and no tenderness.  Lymphadenopathy:    He has no cervical adenopathy.  Neurological: He is alert and oriented to person, place, and time.  Skin: Skin is warm and dry. No rash noted. No erythema.  Psychiatric: He has a normal mood and affect. His behavior is normal.    ED Course  Procedures (including critical care time)  Results for orders placed during the hospital encounter of 02/19/11  CBC      Component Value Range   WBC 4.6  4.0 - 10.5 (K/uL)   RBC 4.57  4.22 - 5.81 (MIL/uL)   Hemoglobin 13.7  13.0 - 17.0 (g/dL)   HCT 16.1  09.6 - 04.5 (%)   MCV 88.2  78.0 - 100.0 (fL)   MCH 30.0  26.0 - 34.0 (pg)   MCHC 34.0  30.0 - 36.0 (g/dL)   RDW 40.9  81.1 - 91.4 (%)   Platelets 229  150 - 400 (K/uL)  PRO B NATRIURETIC PEPTIDE      Component Value Range   Pro B Natriuretic peptide (BNP)  5177.0 (*) 0 - 125 (pg/mL)  PROTIME-INR      Component Value Range   Prothrombin Time 13.9  11.6 - 15.2 (seconds)   INR 1.05  0.00 - 1.49   COMPREHENSIVE METABOLIC PANEL      Component Value Range   Sodium 142  135 - 145 (mEq/L)   Potassium 4.6  3.5 - 5.1 (mEq/L)   Chloride 111  96 - 112 (mEq/L)   CO2 23  19 - 32 (mEq/L)  Glucose, Bld 96  70 - 99 (mg/dL)   BUN 30 (*) 6 - 23 (mg/dL)   Creatinine, Ser 4.09 (*) 0.50 - 1.35 (mg/dL)   Calcium 81.1  8.4 - 10.5 (mg/dL)   Total Protein 7.4  6.0 - 8.3 (g/dL)   Albumin 3.0 (*) 3.5 - 5.2 (g/dL)   AST 39 (*) 0 - 37 (U/L)   ALT 34  0 - 53 (U/L)   Alkaline Phosphatase 64  39 - 117 (U/L)   Total Bilirubin 0.3  0.3 - 1.2 (mg/dL)   GFR calc non Af Amer 47 (*) >90 (mL/min)   GFR calc Af Amer 54 (*) >90 (mL/min)  DIGOXIN LEVEL      Component Value Range   Digoxin Level 0.3 (*) 0.8 - 2.0 (ng/mL)  POCT I-STAT TROPONIN I      Component Value Range   Troponin i, poc 0.02  0.00 - 0.08 (ng/mL)   Comment 3           CARDIAC PANEL(CRET KIN+CKTOT+MB+TROPI)      Component Value Range   Total CK 199  7 - 232 (U/L)   CK, MB 4.9 (*) 0.3 - 4.0 (ng/mL)   Troponin I <0.30  <0.30 (ng/mL)   Relative Index 2.5  0.0 - 2.5   CBC      Component Value Range   WBC 6.0  4.0 - 10.5 (K/uL)   RBC 4.50  4.22 - 5.81 (MIL/uL)   Hemoglobin 13.7  13.0 - 17.0 (g/dL)   HCT 91.4  78.2 - 95.6 (%)   MCV 88.2  78.0 - 100.0 (fL)   MCH 30.4  26.0 - 34.0 (pg)   MCHC 34.5  30.0 - 36.0 (g/dL)   RDW 21.3  08.6 - 57.8 (%)   Platelets 241  150 - 400 (K/uL)  CREATININE, SERUM      Component Value Range   Creatinine, Ser 1.43 (*) 0.50 - 1.35 (mg/dL)   GFR calc non Af Amer 51 (*) >90 (mL/min)   GFR calc Af Amer 60 (*) >90 (mL/min)    Chest Portable 1 View  02/19/2011  *RADIOLOGY REPORT*  Clinical Data: Short of breath.  CHF.  Sarcoidosis.  PORTABLE CHEST - 1 VIEW  Comparison: 01/14/2010.  Findings: Left subclavian AICD.  Cardiopericardial silhouette upper limits of normal for  projection.  Pulmonary vascular congestion. Stable appearance of the pulmonary hila, mildly enlarged.  Basilar atelectasis.  No airspace disease or effusion.  IMPRESSION: Borderline cardiomegaly with pulmonary vascular congestion.  Original Report Authenticated By: Andreas Newport, M.D.   Imaging independently viewed by me, and interpreted by radiologist.   Date: 02/20/2011  Rate: 84  Rhythm: atrial fibrillation  QRS Axis: normal  Intervals: normal  ST/T Wave abnormalities: ST depressions laterally  Conduction Disutrbances:none  Narrative Interpretation:   Old EKG Reviewed: unchanged   1. Dyspnea   2. CHF (congestive heart failure)       MDM  6657: 61 year-old male with history of CHF with recent EF of 35-40% in January, hypertension, sarcoidosis, CAD he was dyspnea that started yesterday. Patient states he's also been nauseated for the past few days as result has not been able taking his medications for the past 3 days. His dyspnea is worse with exertion and worse with lying flat. At baseline he has a two-pillow orthopnea. He is not hypoxic and satting in the upper 90s on room air. His lungs are clear. He does have some fluid in his abdomen but does not  appear volume overloaded his extremities. His BNP is elevated at 5000. Initial troponin is negative. Chest x-ray shows pulmonary vascular congestion. Cardiology has been consulted and pt admitted for further management.         Sheran Luz, MD 02/20/11 [phone]

## 2011-02-19 NOTE — H&P (Signed)
[name] is an 61 y.o. male.   Chief Complaint: Shortness of breath and nausea HPI: 61 years old black male has progressively worsening of shortness of breath without chest pain, cough or fever. H/O dilated cardiomyopathy with ICD implantation. Off medications x 3 days for nausea.  Past Medical History  Diagnosis Date  . Hypertension   . CHF (congestive heart failure)   . Sarcoidosis   . Cardiomyopathy     non ischemic by cath  . Acute on chronic systolic heart failure   . Automatic implantable cardiac defibrillator in situ   . Atrial fibrillation   . NSVT (nonsustained ventricular tachycardia)   . Chronic kidney disease (CKD), stage III (moderate)   . GERD (gastroesophageal reflux disease)   . Hypercholesteremia   . Coronary artery disease   . Myocardial infarction   . Shortness of breath       Past Surgical History  Procedure Date  . Back surgery 1987    Ruptured disk repair  . Pacemaker insertion     with ICD  . Tee without cardioversion 01/17/2011    Procedure: TRANSESOPHAGEAL ECHOCARDIOGRAM (TEE);  Surgeon: Ricki Rodriguez, MD;  Location: [facility] ENDOSCOPY;  Service: Cardiovascular;  Laterality: N/A;  . Cardioversion 01/17/2011    Procedure: CARDIOVERSION;  Surgeon: Ricki Rodriguez, MD;  Location: [facility] ENDOSCOPY;  Service: Cardiovascular;  Laterality: N/A;    Family History  Problem Relation Age of Onset  . Heart disease    . Heart failure    . Stroke    . Anesthesia problems Neg Hx   . Hypotension Neg Hx   . Malignant hyperthermia Neg Hx   . Pseudochol deficiency Neg Hx    Social History:  reports that he has never smoked. He does not have any smokeless tobacco history on file. He reports that he does not drink alcohol or use illicit drugs.  Allergies: No Known Allergies  Medications Prior to Admission  Medication Dose Route Frequency Provider Last Rate Last Dose  . aspirin tablet 325 mg  325 mg Oral STAT Glynn Octave, MD   325 mg at 02/19/11 1636    Medications Prior to Admission  Medication Sig Dispense Refill  . amiodarone (PACERONE) 200 MG tablet Take 200 mg by mouth daily.      Marland Kitchen aspirin EC 81 MG tablet Take 81 mg by mouth daily.      . carvedilol (COREG) 6.25 MG tablet Take 6.25 mg by mouth 2 (two) times daily with a meal.      . digoxin (LANOXIN) 0.25 MG tablet Take 250 mcg by mouth daily.       . furosemide (LASIX) 80 MG tablet Take 80 mg by mouth 2 (two) times daily.      . potassium chloride SA (K-DUR,KLOR-CON) 20 MEQ tablet Take 20 mEq by mouth 2 (two) times daily.      . ramipril (ALTACE) 2.5 MG capsule Take 2.5 mg by mouth daily.      . rosuvastatin (CRESTOR) 5 MG tablet Take 5 mg by mouth at bedtime.       . sucralfate (CARAFATE) 1 G tablet Take 1 g by mouth 4 (four) times daily -  with meals and at bedtime.       Marland Kitchen warfarin (COUMADIN) 10 MG tablet Take 10 mg by mouth at bedtime.         Results for orders placed during the hospital encounter of 02/19/11 (from the past 48 hour(s))  COMPREHENSIVE METABOLIC PANEL  Status: Abnormal   Collection Time   02/19/11  4:21 PM      Component Value Range Comment   Sodium 142  135 - 145 (mEq/L)    Potassium 4.6  3.5 - 5.1 (mEq/L)    Chloride 111  96 - 112 (mEq/L)    CO2 23  19 - 32 (mEq/L)    Glucose, Bld 96  70 - 99 (mg/dL)    BUN 30 (*) 6 - 23 (mg/dL)    Creatinine, Ser 1.19 (*) 0.50 - 1.35 (mg/dL)    Calcium 14.7  8.4 - 10.5 (mg/dL)    Total Protein 7.4  6.0 - 8.3 (g/dL)    Albumin 3.0 (*) 3.5 - 5.2 (g/dL)    AST 39 (*) 0 - 37 (U/L)    ALT 34  0 - 53 (U/L)    Alkaline Phosphatase 64  39 - 117 (U/L)    Total Bilirubin 0.3  0.3 - 1.2 (mg/dL)    GFR calc non Af Amer 47 (*) >90 (mL/min)    GFR calc Af Amer 54 (*) >90 (mL/min)   DIGOXIN LEVEL     Status: Abnormal   Collection Time   02/19/11  4:21 PM      Component Value Range Comment   Digoxin Level 0.3 (*) 0.8 - 2.0 (ng/mL)   CBC     Status: Normal   Collection Time   02/19/11  4:30 PM      Component Value Range  Comment   WBC 4.6  4.0 - 10.5 (K/uL)    RBC 4.57  4.22 - 5.81 (MIL/uL)    Hemoglobin 13.7  13.0 - 17.0 (g/dL)    HCT 82.9  56.2 - 13.0 (%)    MCV 88.2  78.0 - 100.0 (fL)    MCH 30.0  26.0 - 34.0 (pg)    MCHC 34.0  30.0 - 36.0 (g/dL)    RDW 86.5  78.4 - 69.6 (%)    Platelets 229  150 - 400 (K/uL)   PROTIME-INR     Status: Normal   Collection Time   02/19/11  4:30 PM      Component Value Range Comment   Prothrombin Time 13.9  11.6 - 15.2 (seconds)    INR 1.05  0.00 - 1.49    PRO B NATRIURETIC PEPTIDE     Status: Abnormal   Collection Time   02/19/11  4:31 PM      Component Value Range Comment   Pro B Natriuretic peptide (BNP) 5177.0 (*) 0 - 125 (pg/mL)   POCT I-STAT TROPONIN I     Status: Normal   Collection Time   02/19/11  5:39 PM      Component Value Range Comment   Troponin i, poc 0.02  0.00 - 0.08 (ng/mL)    Comment 3             Chest Portable 1 View  02/19/2011  *RADIOLOGY REPORT*  Clinical Data: Short of breath.  CHF.  Sarcoidosis.  PORTABLE CHEST - 1 VIEW  Comparison: 01/14/2010.  Findings: Left subclavian AICD.  Cardiopericardial silhouette upper limits of normal for projection.  Pulmonary vascular congestion. Stable appearance of the pulmonary hila, mildly enlarged.  Basilar atelectasis.  No airspace disease or effusion.  IMPRESSION: Borderline cardiomegaly with pulmonary vascular congestion.  Original Report Authenticated By: Andreas Newport, M.D.    @ROS @  Constitutional: Positive for fatigue (mild). Negative for fever, chills, activity change and appetite change.  HENT: Negative for congestion, sore  throat, rhinorrhea, neck pain and neck stiffness.  Eyes: Negative for photophobia, redness and visual disturbance.  Respiratory: Negative for cough and wheezing. + shortness of breath. Cardiovascular: Negative for chest pain, palpitations and leg swelling.  Gastrointestinal: Negative for nausea, vomiting, abdominal pain, diarrhea, constipation and blood in stool.    Genitourinary: Negative for dysuria, urgency, hematuria and flank pain.  Musculoskeletal: Negative for back pain.  Skin: Negative for rash.  Neurological: Negative for dizziness, seizures, facial asymmetry, speech difficulty, weakness, light-headedness, numbness and headaches.  Psychiatric/Behavioral: Negative for confusion.   Blood pressure 134/94, pulse 54, temperature 98.3 F (36.8 C), resp. rate 21, SpO2 100.00%.   Assessment/Plan Acute on chronic left heart systolic failure Atrial fibrillation Hypertension CKD, II H/O Sarcoidosis Non-ischemic cardiomyopathy  Plan: IV lasix R/O MI Home medications  Lloyd Ayo S 02/19/2011, 7:03 PM

## 2011-02-19 NOTE — ED Notes (Signed)
Pt continues to have 6/10 CP.  Will notify Dr. [name],

## 2011-02-19 NOTE — ED Notes (Signed)
Printed two old ekgs from [facility]. 

## 2011-02-19 NOTE — ED Notes (Signed)
Per dr Algie Coffer, pt has had negative enzymes.  He is to have 2mg  IV morphine and then go to 4700

## 2011-02-20 ENCOUNTER — Other Ambulatory Visit: Payer: Self-pay

## 2011-02-20 LAB — CARDIAC PANEL(CRET KIN+CKTOT+MB+TROPI)
CK, MB: 4.4 ng/mL — ABNORMAL HIGH (ref 0.3–4.0)
CK, MB: 3.9 ng/mL (ref 0.3–4.0)
Total CK: 160 U/L (ref 7–232)
Total CK: 152 U/L (ref 7–232)

## 2011-02-20 LAB — BASIC METABOLIC PANEL
BUN: 27 mg/dL — ABNORMAL HIGH (ref 6–23)
Calcium: 10 mg/dL (ref 8.4–10.5)
Chloride: 109 mEq/L (ref 96–112)
Creatinine, Ser: 1.41 mg/dL — ABNORMAL HIGH (ref 0.50–1.35)
GFR calc Af Amer: 61 mL/min — ABNORMAL LOW (ref >90)

## 2011-02-20 LAB — PROTIME-INR: INR: 1.06 (ref 0.00–1.49)

## 2011-02-20 LAB — HEPARIN LEVEL (UNFRACTIONATED): Heparin Unfractionated: 0.64 IU/mL (ref 0.30–0.70)

## 2011-02-20 MED ORDER — WARFARIN SODIUM 7.5 MG PO TABS
15.0000 mg | ORAL_TABLET | Freq: Once | ORAL | Status: AC
  Administered 2011-02-20: 15 mg via ORAL
  Filled 2011-02-20: qty 2

## 2011-02-20 MED ORDER — ZOLPIDEM TARTRATE 5 MG PO TABS
10.0000 mg | ORAL_TABLET | Freq: Every evening | ORAL | Status: DC | PRN
  Administered 2011-02-20 – 2011-02-22 (×3): 10 mg via ORAL
  Filled 2011-02-20 (×3): qty 2

## 2011-02-20 MED ORDER — HEPARIN BOLUS VIA INFUSION
4000.0000 [IU] | Freq: Once | INTRAVENOUS | Status: AC
  Administered 2011-02-20: 4000 [IU] via INTRAVENOUS
  Filled 2011-02-20: qty 4000

## 2011-02-20 MED ORDER — HEPARIN SOD (PORCINE) IN D5W 100 UNIT/ML IV SOLN
1500.0000 [IU]/h | INTRAVENOUS | Status: DC
  Administered 2011-02-20 – 2011-02-22 (×4): 1500 [IU]/h via INTRAVENOUS
  Filled 2011-02-20 (×7): qty 250

## 2011-02-20 NOTE — Progress Notes (Signed)
  Echocardiogram 2D Echocardiogram has been performed.  [name] 02/20/2011, 11:37 AM

## 2011-02-20 NOTE — Progress Notes (Signed)
ANTICOAGULATION CONSULT NOTE - Follow Up Consult  Pharmacy Consult for Coumadin Indication: atrial fibrillation  No Known Allergies  Patient Measurements: Height: 6\' 3"  (190.5 cm) Weight: 234 lb 1.6 oz (106.187 kg) (scale c) IBW/kg (Calculated) : 84.5   Vital Signs: Temp: 97.9 F (36.6 C) (02/18 1353) Temp src: Oral (02/18 1353) BP: 103/63 mmHg (02/18 1353) Pulse Rate: 62  (02/18 1353)  Labs:  Basename 02/20/11 1939 02/20/11 1329 02/20/11 0535 02/19/11 2030 02/19/11 2020 02/19/11 1630 02/19/11 1621  HGB -- -- -- 13.7 -- 13.7 --  HCT -- -- -- 39.7 -- 40.3 --  PLT -- -- -- 241 -- 229 --  APTT -- -- -- -- -- -- --  LABPROT -- -- 14.0 -- -- 13.9 --  INR -- -- 1.06 -- -- 1.05 --  HEPARINUNFRC 0.64 -- -- -- -- -- --  CREATININE -- -- 1.41* 1.43* -- -- 1.55*  CKTOTAL -- 152 160 -- 199 -- --  CKMB -- 3.9 4.4* -- 4.9* -- --  TROPONINI -- <0.30 <0.30 -- <0.30 -- --   Estimated Creatinine Clearance: 72.5 ml/min (by C-G formula based on Cr of 1.41).   Medications:  Scheduled:     . aspirin EC  81 mg Oral Daily  . carvedilol  6.25 mg Oral Q12H  . digoxin  0.25 mg Oral Daily  . furosemide  80 mg Intravenous BID  . heparin  4,000 Units Intravenous Once  . ramipril  2.5 mg Oral Daily  . rosuvastatin  5 mg Oral Q2000  . sodium chloride  3 mL Intravenous Q12H  . warfarin  10 mg Oral NOW  . warfarin  15 mg Oral ONCE-1800  . DISCONTD: heparin  5,000 Units Subcutaneous Q8H    Assessment: Heparin bridge with subtherapeutic INR for afib. Heparin level 0.64 in goal.  Goal of Therapy:  INR 2-3, Heparin level 0.3-0.7   Plan:  Continue heparin at 1500 units/hr and check next level in am.  Pasty Spillers 02/20/2011,8:47 PM

## 2011-02-20 NOTE — Progress Notes (Signed)
INITIAL ADULT NUTRITION ASSESSMENT Date: 02/20/2011   Time: 12:37 PM Reason for Assessment: Consult  ASSESSMENT: Male 61 y.o.  Dx: SOB, nausea  Hx:  Past Medical History  Diagnosis Date  . Hypertension   . CHF (congestive heart failure)   . Sarcoidosis   . Cardiomyopathy     non ischemic by cath  . Acute on chronic systolic heart failure   . Automatic implantable cardiac defibrillator in situ   . Atrial fibrillation   . NSVT (nonsustained ventricular tachycardia)   . Chronic kidney disease (CKD), stage III (moderate)   . GERD (gastroesophageal reflux disease)   . Hypercholesteremia   . Coronary artery disease   . Myocardial infarction   . Shortness of breath     Related Meds:     . aspirin EC  81 mg Oral Daily  . aspirin  325 mg Oral STAT  . carvedilol  6.25 mg Oral Q12H  . digoxin  0.25 mg Oral Daily  . furosemide  80 mg Intravenous BID  . heparin  4,000 Units Intravenous Once  .  morphine injection  2 mg Intravenous Once  . ramipril  2.5 mg Oral Daily  . rosuvastatin  5 mg Oral Q2000  . sodium chloride  3 mL Intravenous Q12H  . warfarin  10 mg Oral NOW  . warfarin  15 mg Oral ONCE-1800  . DISCONTD: heparin  5,000 Units Subcutaneous Q8H     Ht: 6\' 3"  (190.5 cm)  Wt: 234 lb 1.6 oz (106.187 kg) (scale c)  Ideal Wt:  89.1 kg % Ideal Wt: 119%  Usual Wt:  Wt Readings from Last 3 Encounters:  02/20/11 234 lb 1.6 oz (106.187 kg)  01/18/11 225 lb 12.8 oz (102.422 kg)  01/18/11 225 lb 12.8 oz (102.422 kg)  01/10/11   233 lbs   % Usual Wt: 104%  Body mass index is 29.26 kg/(m^2). Overweight  Food/Nutrition Related Hx: Patient recently completed a 32 day fast, he only ate fruit. The fast was for "personal reasons." thinks he lost about 7 lbs.   Labs:  CMP     Component Value Date/Time   NA 140 02/20/2011 0535   K 4.1 02/20/2011 0535   CL 109 02/20/2011 0535   CO2 23 02/20/2011 0535   GLUCOSE 93 02/20/2011 0535   BUN 27* 02/20/2011 0535   CREATININE 1.41*  02/20/2011 0535   CALCIUM 10.0 02/20/2011 0535   PROT 7.4 02/19/2011 1621   ALBUMIN 3.0* 02/19/2011 1621   AST 39* 02/19/2011 1621   ALT 34 02/19/2011 1621   ALKPHOS 64 02/19/2011 1621   BILITOT 0.3 02/19/2011 1621   GFRNONAA 52* 02/20/2011 0535   GFRAA 61* 02/20/2011 0535     Intake/Output Summary (Last 24 hours) at 02/20/11 1246 Last data filed at 02/20/11 1207  Gross per 24 hour  Intake    266 ml  Output   1425 ml  Net  -1159 ml      Diet Order: Cardiac  Supplements/Tube Feeding: none  IVF:    heparin    Estimated Nutritional Needs:   Kcal: 2200-2400 Protein: 85-95 gm Fluid: 1000 ml fluid restriction  RD was asked to see patient from HR rounds, concerns about his fasting. Patient has since started eating normal foods, no problems since returning to meals. Does not plan on completing another fast any time soon. RD explained the importance of nutrition for good health, patient understands. Weight appears to be moderately stable, possibly elevated during fast. Patient was  recently discharged, at last admission received CHF booklet and had education. RD provided the booklet again but patient denied need for RD to go over it with him again.   NUTRITION DIAGNOSIS: -Inadequate oral intake (NI-2.1).  Status: Resolved  RELATED TO: 32 day fast  AS EVIDENCE BY: reportedly only ate a little bit of fruit for 32 day  MONITORING/EVALUATION(Goals): Goal: patient will continue to eat a normal meal pattern Monitor: Education needs  EDUCATION NEEDS: -Education needs addressed  INTERVENTION: 1. RD provided CHF education booklet and stressed the importance of eating regular meals   Dietitian [phone]  DOCUMENTATION CODES Per approved criteria  -Not Applicable    Clarene Duke MARIE 02/20/2011, 12:37 PM

## 2011-02-20 NOTE — Progress Notes (Addendum)
ANTICOAGULATION CONSULT NOTE - Follow Up Consult  Pharmacy Consult for Coumadin Indication: atrial fibrillation  No Known Allergies  Patient Measurements: Height: 6\' 3"  (190.5 cm) Weight: 234 lb 1.6 oz (106.187 kg) (scale c) IBW/kg (Calculated) : 84.5   Vital Signs: Temp: 97.7 F (36.5 C) (02/18 0418) Temp src: Oral (02/18 0418) BP: 122/88 mmHg (02/18 1021) Pulse Rate: 80  (02/18 1025)  Labs:  Basename 02/20/11 0535 02/19/11 2030 02/19/11 2020 02/19/11 1630 02/19/11 1621  HGB -- 13.7 -- 13.7 --  HCT -- 39.7 -- 40.3 --  PLT -- 241 -- 229 --  APTT -- -- -- -- --  LABPROT 14.0 -- -- 13.9 --  INR 1.06 -- -- 1.05 --  HEPARINUNFRC -- -- -- -- --  CREATININE 1.41* 1.43* -- -- 1.55*  CKTOTAL 160 -- 199 -- --  CKMB 4.4* -- 4.9* -- --  TROPONINI <0.30 -- <0.30 -- --   Estimated Creatinine Clearance: 72.5 ml/min (by C-G formula based on Cr of 1.41).   Medications:  Scheduled:    . aspirin EC  81 mg Oral Daily  . aspirin  325 mg Oral STAT  . carvedilol  6.25 mg Oral Q12H  . digoxin  0.25 mg Oral Daily  . furosemide  80 mg Intravenous BID  . heparin  5,000 Units Subcutaneous Q8H  .  morphine injection  2 mg Intravenous Once  . ramipril  2.5 mg Oral Daily  . rosuvastatin  5 mg Oral Q2000  . sodium chloride  3 mL Intravenous Q12H  . warfarin  10 mg Oral NOW    Assessment: Pt on chronic warfarin for Afib.  Noted medication non-compliance immediately prior to admission due to nausea x 3 days.  INR at baseline on admission with no change today after 10mg  dose given last night. No bleeding noted.   Goal of Therapy:  INR 2-3   Plan:  1) Warfarin 15mg  today 2) F/U INR  Rasheen Schewe, [facility] 02/20/2011,10:39 AM  Addendum: MD request to start heparin bridge while INR subtherapeutic.  Heparin dosing wt= 105.8 kg; Will start heparin to target heparin level 0.3-0.7 1) Heparin 4000 units IV bolus x1 2) Heparin drip at 1500 units/hr 3) Check 6 hr heparin level 4) Daily  heparin level & CBC while on heparin

## 2011-02-20 NOTE — ED Provider Notes (Signed)
I saw and evaluated the patient, reviewed the resident's note and I agree with the findings and plan.  Hx nonischemic cardiomyopathy, off meds x 3 days due to nausea.  Increased SOB, orthopnea, abdominal swelling.  No chest pain.  Lungs clear.  tachypneic but no distress.  Glynn Octave, MD 02/20/11 [phone]

## 2011-02-20 NOTE — Progress Notes (Signed)
Subjective:  Patient denies any chest pain states breathing is improved States missed his medication for few days Patient am back in atrial fibrillation recently had TEE cardioversion INR remains subtherapeutic  Objective:  Vital Signs in the last 24 hours: Temp:  [97.5 F (36.4 C)-98.3 F (36.8 C)] 97.7 F (36.5 C) (02/18 0418) Pulse Rate:  [54-96] 80  (02/18 1025) Resp:  [17-21] 18  (02/18 0418) BP: (122-156)/(84-112) 122/88 mmHg (02/18 1021) SpO2:  [97 %-100 %] 100 % (02/18 0418) Weight:  [106.187 kg (234 lb 1.6 oz)-106.2 kg (234 lb 2.1 oz)] 106.187 kg (234 lb 1.6 oz) (02/18 0418)  Intake/Output from previous day: 02/17 0701 - 02/18 0700 In: 123 [P.O.:120; I.V.:3] Out: 425 [Urine:425] Intake/Output from this shift: Total I/O In: 143 [P.O.:140; I.V.:3] Out: -   Physical Exam: General appearance: alert and cooperative Neck: no carotid bruit, no JVD and supple, symmetrical, trachea midline Lungs: Decreased breath sounds at bases with occasional rales Heart: irregularly irregular rhythm, S1, S2 normal and Soft systolic murmur and S3 gallop noted Abdomen: soft, non-tender; bowel sounds normal; no masses,  no organomegaly Extremities: extremities normal, atraumatic, no cyanosis or edema  Lab Results:  Basename 02/19/11 2030 02/19/11 1630  WBC 6.0 4.6  HGB 13.7 13.7  PLT 241 229    Basename 02/20/11 0535 02/19/11 2030 02/19/11 1621  NA 140 -- 142  K 4.1 -- 4.6  CL 109 -- 111  CO2 23 -- 23  GLUCOSE 93 -- 96  BUN 27* -- 30*  CREATININE 1.41* 1.43* --    Basename 02/20/11 0535 02/19/11 2020  TROPONINI <0.30 <0.30   Hepatic Function Panel  Basename 02/19/11 1621  PROT 7.4  ALBUMIN 3.0*  AST 39*  ALT 34  ALKPHOS 64  BILITOT 0.3  BILIDIR --  IBILI --   No results found for this basename: CHOL in the last 72 hours No results found for this basename: PROTIME in the last 72 hours  Imaging: Imaging results have been reviewed and Chest Portable 1  View  02/19/2011  *RADIOLOGY REPORT*  Clinical Data: Short of breath.  CHF.  Sarcoidosis.  PORTABLE CHEST - 1 VIEW  Comparison: 01/14/2010.  Findings: Left subclavian AICD.  Cardiopericardial silhouette upper limits of normal for projection.  Pulmonary vascular congestion. Stable appearance of the pulmonary hila, mildly enlarged.  Basilar atelectasis.  No airspace disease or effusion.  IMPRESSION: Borderline cardiomegaly with pulmonary vascular congestion.  Original Report Authenticated By: Andreas Newport, M.D.    Cardiac Studies:  Assessment/Plan:  Decompensated acute systolic heart failure Recurrent atrial fibrillation Hypertension History of nonsustained VT in the past Glucose intolerance Nonischemic cardiomyopathy Chronic kidney  disease stage IV GERD History of sarcoidosis Plan Change heparin to IV per pharmacy Continue Coumadin per pharmacy protocol Check labs in a.m.  LOS: 1 day    Avrohom Mckelvin N 02/20/2011, 12:05 PM

## 2011-02-20 NOTE — Progress Notes (Signed)
Pt had a 14 beat run of v.tach. VS stable. Pt states he was feeling funny but now feels fine. Dr. [name] was paged. Will continue to monitor.

## 2011-02-20 NOTE — Progress Notes (Signed)
02/20/11 1600 UR Completed. Tera Mater, RN, BSN [phone]

## 2011-02-21 LAB — PROTIME-INR: Prothrombin Time: 16.1 seconds — ABNORMAL HIGH (ref 11.6–15.2)

## 2011-02-21 LAB — CBC
HCT: 43.5 % (ref 39.0–52.0)
Hemoglobin: 15.3 g/dL (ref 13.0–17.0)
MCH: 30.5 pg (ref 26.0–34.0)
MCV: 86.8 fL (ref 78.0–100.0)
RBC: 5.01 MIL/uL (ref 4.22–5.81)
WBC: 6.5 10*3/uL (ref 4.0–10.5)

## 2011-02-21 LAB — BASIC METABOLIC PANEL
BUN: 30 mg/dL — ABNORMAL HIGH (ref 6–23)
CO2: 24 mEq/L (ref 19–32)
Calcium: 10.1 mg/dL (ref 8.4–10.5)
Chloride: 103 mEq/L (ref 96–112)
Creatinine, Ser: 1.62 mg/dL — ABNORMAL HIGH (ref 0.50–1.35)

## 2011-02-21 MED ORDER — SUCRALFATE 1 GM/10ML PO SUSP
1.0000 g | Freq: Three times a day (TID) | ORAL | Status: DC
  Administered 2011-02-21 – 2011-02-23 (×7): 1 g via ORAL
  Filled 2011-02-21 (×11): qty 10

## 2011-02-21 MED ORDER — PANTOPRAZOLE SODIUM 40 MG PO TBEC: 40.0000 mg | DELAYED_RELEASE_TABLET | Freq: Every day | ORAL | Status: DC

## 2011-02-21 MED ORDER — COLCHICINE 0.6 MG PO TABS
0.6000 mg | ORAL_TABLET | Freq: Every day | ORAL | Status: DC
  Administered 2011-02-21 – 2011-02-23 (×3): 0.6 mg via ORAL
  Filled 2011-02-21 (×4): qty 1

## 2011-02-21 MED ORDER — PANTOPRAZOLE SODIUM 40 MG PO TBEC
40.0000 mg | DELAYED_RELEASE_TABLET | Freq: Every day | ORAL | Status: DC
  Administered 2011-02-21 – 2011-02-23 (×3): 40 mg via ORAL
  Filled 2011-02-21 (×3): qty 1

## 2011-02-21 MED ORDER — WARFARIN SODIUM 7.5 MG PO TABS
15.0000 mg | ORAL_TABLET | Freq: Once | ORAL | Status: AC
  Administered 2011-02-21: 15 mg via ORAL
  Filled 2011-02-21: qty 2

## 2011-02-21 MED ORDER — AMIODARONE HCL 200 MG PO TABS: 200.0000 mg | ORAL_TABLET | Freq: Two times a day (BID) | ORAL | Status: DC

## 2011-02-21 MED ORDER — AMIODARONE HCL 200 MG PO TABS
200.0000 mg | ORAL_TABLET | Freq: Two times a day (BID) | ORAL | Status: DC
  Administered 2011-02-21 – 2011-02-23 (×4): 200 mg via ORAL
  Filled 2011-02-21 (×5): qty 1

## 2011-02-21 NOTE — Progress Notes (Signed)
Pt complaining of nausea. Dr. [name] was paged. Will continue to monitor.

## 2011-02-21 NOTE — Progress Notes (Signed)
Subjective:  Patient complains of vague abdominal pain and right  foot pain Denies any chest pain breathing has improved Patient remains in atrial fibrillation patient had few beats of nonsustained V. tach asymptomatic  Objective:  Vital Signs in the last 24 hours: Temp:  [97.7 F (36.5 C)-99.5 F (37.5 C)] 97.7 F (36.5 C) (02/19 1329) Pulse Rate:  [50-72] 72  (02/19 1329) Resp:  [18-19] 18  (02/19 1329) BP: (110-135)/(75-82) 135/75 mmHg (02/19 1329) SpO2:  [95 %-96 %] 96 % (02/19 1329) Weight:  [103.057 kg (227 lb 3.2 oz)] 103.057 kg (227 lb 3.2 oz) (02/19 0444)  Intake/Output from previous day: 02/18 0701 - 02/19 0700 In: 1258 [P.O.:980; I.V.:278] Out: 4925 [Urine:4925] Intake/Output from this shift:    Physical Exam: Neck: no carotid bruit, no JVD and supple, symmetrical, trachea midline Lungs: Decreased breath sounds at bases with fine rales Heart: irregularly irregular rhythm, S1, S2 normal and Soft systolic murmur and S3 gallop noted Abdomen: soft, non-tender; bowel sounds normal; no masses,  no organomegaly Extremities: extremities normal, atraumatic, no cyanosis or edema Pulses: 2+ and symmetric  Lab Results:  Basename 02/21/11 0638 02/19/11 2030  WBC 6.5 6.0  HGB 15.3 13.7  PLT 235 241    Basename 02/21/11 0638 02/20/11 0535  NA 137 140  K 3.7 4.1  CL 103 109  CO2 24 23  GLUCOSE 110* 93  BUN 30* 27*  CREATININE 1.62* 1.41*    Basename 02/20/11 1329 02/20/11 0535  TROPONINI <0.30 <0.30   Hepatic Function Panel  Basename 02/19/11 1621  PROT 7.4  ALBUMIN 3.0*  AST 39*  ALT 34  ALKPHOS 64  BILITOT 0.3  BILIDIR --  IBILI --   No results found for this basename: CHOL in the last 72 hours No results found for this basename: PROTIME in the last 72 hours  Imaging: Imaging results have been reviewed and No results found.  Cardiac Studies:  Assessment/Plan:  Decompensated acute systolic heart failure Recurrent atrial  fibrillation Hypertension Recurrent nonsustained VT Glucose intolerance Nonischemic cardiomyopathy Chronic kidney disease GERD History of sarcoidosis Gouty arthritis Plan Add amiodarone per orders Add colchicine per orders Restart PPIs and Carafate  LOS: 2 days    [name] N 02/21/2011, 7:09 PM

## 2011-02-21 NOTE — Progress Notes (Signed)
ANTICOAGULATION CONSULT NOTE - Follow Up Consult  Pharmacy Consult for Heparin --> Coumadin Indication: atrial fibrillation  No Known Allergies  Patient Measurements: Height: 6\' 3"  (190.5 cm) Weight: 227 lb 3.2 oz (103.057 kg) (scale c) IBW/kg (Calculated) : 84.5  Heparin Dosing Wt: 103 kg  Vital Signs: Temp: 98 F (36.7 C) (02/19 0444) Temp src: Oral (02/19 0444) BP: 112/82 mmHg (02/19 0444) Pulse Rate: 50  (02/19 0444)  Labs:  Basename 02/21/11 1610 02/20/11 1939 02/20/11 1329 02/20/11 0535 02/19/11 2030 02/19/11 2020 02/19/11 1630  HGB 15.3 -- -- -- 13.7 -- --  HCT 43.5 -- -- -- 39.7 -- 40.3  PLT 235 -- -- -- 241 -- 229  APTT -- -- -- -- -- -- --  LABPROT 16.1* -- -- 14.0 -- -- 13.9  INR 1.26 -- -- 1.06 -- -- 1.05  HEPARINUNFRC 0.58 0.64 -- -- -- -- --  CREATININE 1.62* -- -- 1.41* 1.43* -- --  CKTOTAL -- -- 152 160 -- 199 --  CKMB -- -- 3.9 4.4* -- 4.9* --  TROPONINI -- -- <0.30 <0.30 -- <0.30 --   Estimated Creatinine Clearance: 62.2 ml/min (by C-G formula based on Cr of 1.62).   Medications:  Scheduled:     . aspirin EC  81 mg Oral Daily  . carvedilol  6.25 mg Oral Q12H  . digoxin  0.25 mg Oral Daily  . furosemide  80 mg Intravenous BID  . heparin  4,000 Units Intravenous Once  . ramipril  2.5 mg Oral Daily  . rosuvastatin  5 mg Oral Q2000  . sodium chloride  3 mL Intravenous Q12H  . warfarin  15 mg Oral ONCE-1800  . DISCONTD: heparin  5,000 Units Subcutaneous Q8H    Assessment: Pt on chronic warfarin for Afib. Noted medication non-compliance immediately prior to admission due to nausea x 3 days. INR at baseline on admission.  Slowly rising to goal.  He is being bridged with heparin while INR<2. No bleeding noted.   Goal of Therapy:  INR 2-3, Heparin level 0.3-0.7   Plan:  1) Repeat warfarin 15mg  po x 1 today. 2) Continue heparin at 1500 units/hr  3) Check heparin level, CBC, INR in am.  [name] 02/21/2011,9:31 AM

## 2011-02-21 NOTE — Progress Notes (Signed)
Pt had 6 beat run of V.tach. Dr. [name] notified.

## 2011-02-22 LAB — CBC
HCT: 43.9 % (ref 39.0–52.0)
Hemoglobin: 15.7 g/dL (ref 13.0–17.0)
MCH: 31 pg (ref 26.0–34.0)
MCV: 86.6 fL (ref 78.0–100.0)
Platelets: 240 10*3/uL (ref 150–400)
RBC: 5.07 MIL/uL (ref 4.22–5.81)
WBC: 6.3 10*3/uL (ref 4.0–10.5)

## 2011-02-22 LAB — BASIC METABOLIC PANEL
BUN: 35 mg/dL — ABNORMAL HIGH (ref 6–23)
CO2: 24 mEq/L (ref 19–32)
Calcium: 9.9 mg/dL (ref 8.4–10.5)
Chloride: 101 mEq/L (ref 96–112)
Creatinine, Ser: 1.8 mg/dL — ABNORMAL HIGH (ref 0.50–1.35)
Glucose, Bld: 128 mg/dL — ABNORMAL HIGH (ref 70–99)

## 2011-02-22 LAB — PROTIME-INR: Prothrombin Time: 19.6 seconds — ABNORMAL HIGH (ref 11.6–15.2)

## 2011-02-22 MED ORDER — WARFARIN SODIUM 10 MG PO TABS
10.0000 mg | ORAL_TABLET | Freq: Once | ORAL | Status: AC
  Administered 2011-02-22: 10 mg via ORAL
  Filled 2011-02-22: qty 1

## 2011-02-22 NOTE — Progress Notes (Signed)
Subjective:  Patient denies any chest pain or shortness of breath Abdominal pain improved Patient remains in A. fib with controlled ventricle response no further episodes of nonsustained VT  Objective:  Vital Signs in the last 24 hours: Temp:  [97.9 F (36.6 C)-98.7 F (37.1 C)] 97.9 F (36.6 C) (02/20 1400) Pulse Rate:  [52-72] 67  (02/20 1400) Resp:  [18-20] 20  (02/20 1400) BP: (107-115)/(71-77) 110/72 mmHg (02/20 1400) SpO2:  [93 %-98 %] 98 % (02/20 1400) Weight:  [102.1 kg (225 lb 1.4 oz)] 102.1 kg (225 lb 1.4 oz) (02/20 0424)  Intake/Output from previous day: 02/19 0701 - 02/20 0700 In: 1355 [P.O.:1080; I.V.:275] Out: 2100 [Urine:2100] Intake/Output from this shift: Total I/O In: 480 [P.O.:480] Out: 300 [Urine:300]  Physical Exam: Neck: no adenopathy, no carotid bruit, no JVD, supple, symmetrical, trachea midline and thyroid not enlarged, symmetric, no tenderness/mass/nodules Lungs: clear to auscultation bilaterally Heart: irregularly irregular rhythm, S1, S2 normal and Soft S3 gallop noted and soft systolic murmur noted Abdomen: soft, non-tender; bowel sounds normal; no masses,  no organomegaly Extremities: extremities normal, atraumatic, no cyanosis or edema  Lab Results:  Basename 02/22/11 0525 02/21/11 0638  WBC 6.3 6.5  HGB 15.7 15.3  PLT 240 235    Basename 02/22/11 0525 02/21/11 0638  NA 136 137  K 3.4* 3.7  CL 101 103  CO2 24 24  GLUCOSE 128* 110*  BUN 35* 30*  CREATININE 1.80* 1.62*    Basename 02/20/11 1329 02/20/11 0535  TROPONINI <0.30 <0.30   Hepatic Function Panel No results found for this basename: PROT,ALBUMIN,AST,ALT,ALKPHOS,BILITOT,BILIDIR,IBILI in the last 72 hours No results found for this basename: CHOL in the last 72 hours No results found for this basename: PROTIME in the last 72 hours  Imaging: Imaging results have been reviewed and No results found.  Cardiac Studies:  Assessment/Plan:  Resolving acute decompensated  systolic heart failure Chronic atrial fibrillation Hypertension Recurrent status post nonsustained VT Glucose intolerance Nonischemic cardiomyopathy Chronic kidney disease stage IV GERD History of sarcoidosis Gouty arthritis Plan Continue present management Check labs in a.m.  LOS: 3 days    Henreitta Spittler N 02/22/2011, 6:12 PM

## 2011-02-22 NOTE — Progress Notes (Signed)
ANTICOAGULATION CONSULT NOTE - Follow Up Consult  Pharmacy Consult for Heparin --> Coumadin Indication: atrial fibrillation  No Known Allergies  Patient Measurements: Height: 6\' 3"  (190.5 cm) Weight: 225 lb 1.4 oz (102.1 kg) (c scale) IBW/kg (Calculated) : 84.5  Heparin Dosing Wt: 103 kg  Vital Signs: Temp: 98.5 F (36.9 C) (02/20 0424) Temp src: Oral (02/20 0424) BP: 115/75 mmHg (02/20 0424) Pulse Rate: 72  (02/20 0424)  Labs:  [name] 02/22/11 0525 02/21/11 4540 02/20/11 1939 02/20/11 1329 02/20/11 0535 02/19/11 2030 02/19/11 2020  HGB 15.7 15.3 -- -- -- -- --  HCT 43.9 43.5 -- -- -- 39.7 --  PLT 240 235 -- -- -- 241 --  APTT -- -- -- -- -- -- --  LABPROT 19.6* 16.1* -- -- 14.0 -- --  INR 1.63* 1.26 -- -- 1.06 -- --  HEPARINUNFRC 0.42 0.58 0.64 -- -- -- --  CREATININE 1.80* 1.62* -- -- 1.41* -- --  CKTOTAL -- -- -- 152 160 -- 199  CKMB -- -- -- 3.9 4.4* -- 4.9*  TROPONINI -- -- -- <0.30 <0.30 -- <0.30   Estimated Creatinine Clearance: 55.8 ml/min (by C-G formula based on Cr of 1.8).   Medications:  Scheduled:     . amiodarone  200 mg Oral BID  . aspirin EC  81 mg Oral Daily  . carvedilol  6.25 mg Oral Q12H  . colchicine  0.6 mg Oral Daily  . digoxin  0.25 mg Oral Daily  . furosemide  80 mg Intravenous BID  . pantoprazole  40 mg Oral Q1200  . ramipril  2.5 mg Oral Daily  . rosuvastatin  5 mg Oral Q2000  . sodium chloride  3 mL Intravenous Q12H  . sucralfate  1 g Oral TID WC & HS  . warfarin  15 mg Oral ONCE-1800  . DISCONTD: amiodarone  200 mg Oral BID  . DISCONTD: pantoprazole  40 mg Oral Q0600  . DISCONTD: pantoprazole  40 mg Oral Q0600    Assessment: Pt on chronic warfarin for Afib. Noted medication non-compliance immediately prior to admission due to nausea x 3 days. INR at baseline on admission.    INR rising to goal.  He is being bridged with heparin while INR<2. No bleeding noted. Noted home amiodarone resumed.  Goal of Therapy:  INR 2-3,  Heparin level 0.3-0.7   Plan:  1) Resume patient's home warfarin dose of 10mg  daily 2) Continue heparin at 1500 units/hr  3) Check heparin level, CBC, INR in am.  [name] 02/22/2011,9:47 AM

## 2011-02-22 NOTE — Progress Notes (Signed)
Pt had 7 beats of V-Tach, pt was asymptomatic vital signs were stable, BP-110/75, P-71, O2- 93ra. Will continue to monitor pt.---Ajwa Kimberley, D, rn

## 2011-02-23 LAB — BASIC METABOLIC PANEL
CO2: 23 mEq/L (ref 19–32)
Calcium: 9.8 mg/dL (ref 8.4–10.5)
Chloride: 101 mEq/L (ref 96–112)
Creatinine, Ser: 1.97 mg/dL — ABNORMAL HIGH (ref 0.50–1.35)
Glucose, Bld: 107 mg/dL — ABNORMAL HIGH (ref 70–99)

## 2011-02-23 LAB — CBC
HCT: 44.9 % (ref 39.0–52.0)
MCV: 87.4 fL (ref 78.0–100.0)
Platelets: 220 10*3/uL (ref 150–400)
RBC: 5.14 MIL/uL (ref 4.22–5.81)
RDW: 13.2 % (ref 11.5–15.5)
WBC: 6.1 10*3/uL (ref 4.0–10.5)

## 2011-02-23 LAB — HEPARIN LEVEL (UNFRACTIONATED): Heparin Unfractionated: 0.56 IU/mL (ref 0.30–0.70)

## 2011-02-23 MED ORDER — WARFARIN SODIUM 10 MG PO TABS
10.0000 mg | ORAL_TABLET | Freq: Once | ORAL | Status: DC
  Filled 2011-02-23: qty 1

## 2011-02-23 MED ORDER — ALLOPURINOL 100 MG PO TABS: 100.0000 mg | ORAL_TABLET | Freq: Every day | ORAL | Status: DC

## 2011-02-23 MED ORDER — PANTOPRAZOLE SODIUM 40 MG PO TBEC: 40.0000 mg | DELAYED_RELEASE_TABLET | Freq: Every day | ORAL | Status: DC

## 2011-02-23 NOTE — Discharge Instructions (Signed)
Heart Failure Heart failure (HF) is a condition in which the heart has trouble pumping blood. This means your heart does not pump blood efficiently for your body to work well. In some cases of HF, fluid may back up into your lungs or you may have swelling (edema) in your lower legs. HF is a long-term (chronic) condition. It is important for you to take good care of yourself and follow your caregiver's treatment plan. CAUSES   Health conditions:   High blood pressure (hypertension) causes the heart muscle to work harder than normal. When pressure in the blood vessels is high, the heart needs to pump (contract) with more force in order to circulate blood throughout the body. High blood pressure eventually causes the heart to become stiff and weak.   Coronary artery disease (CAD) is the buildup of cholesterol and fat (plaques) in the arteries of the heart. The blockage in the arteries deprives the heart muscle of oxygen and blood. This can cause chest pain and may lead to a heart attack. High blood pressure can also contribute to CAD.   Heart attack (myocardial infarction) occurs when 1 or more arteries in the heart become blocked. The loss of oxygen damages the muscle tissue of the heart. When this happens, part of the heart muscle dies. The injured tissue does not contract as well and weakens the heart's ability to pump blood.   Abnormal heart valves can cause HF when the heart valves do not open and close properly. This makes the heart muscle pump harder to keep the blood flowing.   Heart muscle disease (cardiomyopathy or myocarditis) is damage to the heart muscle from a variety of causes. These can include drug or alcohol abuse, infections, or unknown reasons. These can increase the risk of HF.   Lung disease makes the heart work harder because the lungs do not work properly. This can cause a strain on the heart leading it to fail.   Diabetes increases the risk of HF. High blood sugar contributes  to high fat (lipid) levels in the blood. Diabetes can also cause slow damage to tiny blood vessels that carry important nutrients to the heart muscle. When the heart does not get enough oxygen and food, it can cause the heart to become weak and stiff. This leads to a heart that does not contract efficiently.   Other diseases can contribute to HF. These include abnormal heart rhythms, thyroid problems, and low blood counts (anemia).   Unhealthy lifestyle habits:   Obesity.   Smoking.   Eating foods high in fat and cholesterol.   Eating or drinking beverages high in salt.   Drug or alcohol abuse.   Lack of exercise.  SYMPTOMS  HF symptoms may vary and can be hard to detect. Symptoms may include:  Shortness of breath with activity, such as climbing stairs.   Persistent cough.   Swelling of the feet, ankles, legs, or abdomen.   Unexplained weight gain.   Difficulty breathing when lying flat.   Waking from sleep because of the need to sit up and get more air.   Rapid heartbeat.   Fatigue and loss of energy.   Feeling lightheaded or close to fainting.  DIAGNOSIS  A diagnosis of HF is based on your history, symptoms, physical examination, and diagnostic tests. Diagnostic tests for HF may include:  EKG.   Chest X-ray.   Blood tests.   Exercise stress test.   Blood oxygen test (arterial blood gas).   Evaluation   by a heart doctor (cardiologist).   Ultrasound evaluation of the heart (echocardiogram).   Heart artery test to look for blockages (angiogram).   Radioactive imaging to look at the heart (radionuclide test).  TREATMENT  Treatment is aimed at managing the symptoms of HF. Medicines, lifestyle changes, or surgical intervention may be necessary to treat HF.  Medicines to help treat HF may include:   Angiotensin-converting enzyme (ACE) inhibitors. These block the effects of a blood protein called angiotensin-converting enzyme. ACE inhibitors relax (dilate) the  blood vessels and help lower blood pressure. This decreases the workload of the heart, slows the progression of HF, and improves symptoms.   Angiotensin receptor blockers (ARBs). These medications work similar to ACE inhibitors. ARBs may be an alternative for people who cannot tolerate an ACE inhibitor.   Aldosterone antagonists. This medication helps get rid of extra fluid from your body. This lowers the volume of blood the heart has to pump.   Water pills (diuretics). Diuretics cause the kidneys to remove salt and water from the blood. The extra fluid is removed by urination. By removing extra fluid from the body, diuretics help lower the workload of the heart and help prevent fluid buildup in the lungs so breathing is easier.   Beta blockers. These prevent the heart from beating too fast and improve heart muscle strength. Beta blockers help maintain a normal heart rate, control blood pressure, and improve HF symptoms.   Digitalis. This increases the force of the heartbeat and may be helpful to people with HF or heart rhythm problems.   Healthy lifestyle changes include:   Stopping smoking.   Eating a healthy diet. Avoid foods high in fat. Avoid foods fried in oil or made with fat. A dietician can help with healthy food choices.   Limiting how much salt you eat.   Limiting alcohol intake to no more than 1 drink per day for women and 2 drinks per day for men. Drinking more than that is harmful to your heart. If your heart has already been damaged by alcohol or you have severe HF, drinking alcohol should be stopped completely.   Exercising as directed by your caregiver.   Surgical treatment for HF may include:   Procedures to open blocked arteries, repair damaged heart valves, or remove damaged heart muscle tissue.   A pacemaker to help heart muscle function and to control certain abnormal heart rhythms.   A defibrillator to possibly prevent sudden cardiac death.  HOME CARE  INSTRUCTIONS   Activity level. Your caregiver can help you determine what type of exercise program may be helpful. It is important to maintain your strength. Pace your physical activity to avoid shortness of breath or chest pain. Rest for 1 hour before and after meals. A cardiac rehabilitation program may be helpful to some people with HF.   Diet. Eat a heart healthy diet. Food choices should be low in saturated fat and cholesterol. Talk to a dietician to learn about heart healthy foods.   Salt intake. When you have HF, you need to limit the amount of salt you eat. Eat less than 1500 milligrams (mg) of salt per day or as recommended by your caregiver.   Weight monitoring. Weigh yourself every day. You should weigh yourself in the morning after you urinate and before you eat breakfast. Wear the same amount of clothing each time you weigh yourself. Record your weight daily. Bring your recorded weights to your clinic visits. Tell your caregiver right away if   you have gained 3 lb/1.4 kg in 1 day, or 5 lb/2.3 kg in a week or whatever amount you were told to report.   Blood pressure monitoring. This should be done as directed by your caregiver. A home blood pressure cuff can be purchased at a drugstore. Record your blood pressure numbers and bring them to your clinic visits. Tell your caregiver if you become dizzy or lightheaded upon standing up.   Smoking. If you are currently a smoker, it is time to quit. Nicotine makes your heart work harder by causing your blood vessels to constrict. Do not use nicotine gum or patches before talking to your caregiver.   Follow up. Be sure to schedule a follow-up visit with your caregiver. Keep all your appointments.  SEEK MEDICAL CARE IF:   Your weight increases by 3 lb/1.4 kg in 1 day or 5 lb/2.3 kg in a week.   You notice increasing shortness of breath that is unusual for you. This may happen during rest, sleep, or with activity.   You cough more than normal,  especially with physical activity.   You notice more swelling in your hands, feet, ankles, or belly (abdomen).   You are unable to sleep because it is hard to breathe.   You cough up bloody mucus (sputum).   You begin to feel "jumping" or "fluttering" sensations (palpitations) in your chest.  SEEK IMMEDIATE MEDICAL CARE IF:   You have severe chest pain or pressure which may include symptoms such as:   Pain or pressure in the arms, neck, jaw, or back.   Feeling sweaty.   Feeling sick to your stomach (nauseous).   Feeling short of breath while at rest.   Having a fast or irregular heartbeat.   You experience stroke symptoms. These symptoms include:   Facial weakness or numbness.   Weakness or numbness in an arm, leg, or on one side of your body.   Blurred vision.   Difficulty talking or thinking.   Dizziness or fainting.   Severe headache.  THESE ARE MEDICAL EMERGENCIES. Do not wait to see if the symptoms go away. Call your local emergency services (911 in [place] DO NOT drive yourself to the hospital. IMPORTANT  Make a list of every medicine, vitamin, or herbal supplement you are taking. Keep the list with you at all times. Show it to your caregiver at every visit. Keep the list up-to-date.   Ask your caregiver or pharmacist to write an explanation of each medicine you are taking. This should include:   Why you are taking it.   The possible side effects.   The best time of day to take it.   Foods to take with it or what foods to avoid.   When to stop taking it.  MAKE SURE YOU:   Understand these instructions.   Will watch your condition.   Will get help right away if you are not doing well or get worse.  Document Released: 12/19/2004 Document Revised: 08/31/2010 Document Reviewed: 04/02/2009 [employer] Patient Information 2012 [employer]. 

## 2011-02-23 NOTE — Progress Notes (Signed)
ANTICOAGULATION CONSULT NOTE - Follow Up Consult  Pharmacy Consult for  Heparin->Coumadin Indication: atrial fibrillation  No Known Allergies  Patient Measurements: Height: 6\' 3"  (190.5 cm) Weight: 225 lb 1.4 oz (102.1 kg) (scale c) IBW/kg (Calculated) : 84.5  Heparin Dosing Weight: 103 kg  Vital Signs: Temp: 98 F (36.7 C) (02/21 0556) Temp src: Oral (02/21 0556) BP: 116/77 mmHg (02/21 1033) Pulse Rate: 71  (02/21 1033)  Labs:  Basename 02/23/11 0535 02/22/11 0525 02/21/11 0638 02/20/11 1329  HGB 15.7 15.7 -- --  HCT 44.9 43.9 43.5 --  PLT 220 240 235 --  APTT -- -- -- --  LABPROT 25.2* 19.6* 16.1* --  INR 2.24* 1.63* 1.26 --  HEPARINUNFRC 0.56 0.42 0.58 --  CREATININE 1.97* 1.80* 1.62* --  CKTOTAL -- -- -- 152  CKMB -- -- -- 3.9  TROPONINI -- -- -- <0.30   Estimated Creatinine Clearance: 51 ml/min (by C-G formula based on Cr of 1.97).  Assessment:   Heparin level therapeutic on 1500 units/hr.   INR now into therapeutic range.   CBC stable.      Amiodarone resumed 2/19 pm - 200 mg BID.  Goal of Therapy:  INR 2-3 Heparin level 0.3-0.7   Plan:     Will discontinue Heparin as INR back into target range.     Continue with home Coumadin dose of 10 mg tonight.   Continue daily PT/INR for now.  Dennie Fetters, [place] Pager: [phone] 02/23/2011,12:43 PM

## 2011-02-23 NOTE — Discharge Summary (Signed)
  Discharge summary dictated on 02/23/2011 dictation number is [id number]

## 2011-02-24 NOTE — Discharge Summary (Signed)
[name], [name]             ACCOUNT NO.:  [id number]  MEDICAL RECORD NO.:  [id number]  LOCATION:  4702                         FACILITY:  [facility]  PHYSICIAN:  [name]. [name], M.D. DATE OF BIRTH:  [birth year]  DATE OF ADMISSION:  02/19/2011 DATE OF DISCHARGE:  02/23/2011                              DISCHARGE SUMMARY   ADMITTING DIAGNOSES:  Acute on chronic left heart systolic failure, new onset atrial fibrillation, hypertension, chronic kidney disease stage II, history of sarcoidosis, nonischemic cardiomyopathy.  DISCHARGE DIAGNOSES: 1. Compensated systolic heart failure. 2. 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 3. Recurrent atrial fibrillation. 4. Hypertension. 5. Status post nonsustained ventricular tachycardia, asymptomatic. 6. Glucose intolerance. 7. Chronic kidney disease stage IV. 8. History of sarcoidosis. 9. Gastroesophageal reflux disease. 10.History of gouty arthritis.  DISCHARGE HOME MEDICATIONS:  Protonix 40 mg 1 tablet daily, allopurinol 100 mg 1 tablet daily, amiodarone 200 mg 1 tablet daily, enteric-coated aspirin 81 mg 1 tablet daily, carvedilol 6.25 mg 1 tablet twice daily, colchicine 0.6 mg 1 tablet daily, digoxin 0.25 mg 1 tablet daily, furosemide 80 mg 1 tablet twice daily, potassium chloride 20 mEq twice daily, ramipril 2.5 mg 1 capsule daily, Crestor 5 mg 1 tablet daily, Carafate 1 g 4 times daily, warfarin 10 mg 1 tablet daily at bedtime.  DIET:  Low salt, low cholesterol 1800 calories ADA diet.  The patient has been given instructions regarding heart failure and restrict fluid to 1 L per 24 hours.  The patient has been advised to monitor weight daily.  We will check PT/INR early next week.  CONDITION AT DISCHARGE:  Stable.  BRIEF HISTORY AND HOSPITAL COURSE:  [name] is a 61 year old male with past medical history significant for multiple medical problems and multiple admissions, i.e., nonischemic  dilated cardiomyopathy status post ICD in the past, history of recurrent systolic heart failure secondary to noncompliance to meds and diet, hypertension, history of atrial fibrillation, recently had TEE cardioversion in January 2013, glucose intolerance, history of nonsustained VT, GERD, chronic kidney disease, history of sarcoidosis, was admitted by Dr. Algie Coffer because of progressive worsening shortness of breath without chest pain, cough, or fever.  The patient states he has not taken medication for last 3 days because of nausea.  The patient was noted to be in congestive heart failure with new onset of AFib with controlled ventricular response. The patient denies any palpitation, lightheadedness, or syncope.  Denies any ICD discharges.  PHYSICAL EXAMINATION:  VITAL SIGNS:  His blood pressure was 134/94, pulse was 54, he was afebrile. NECK:  Supple.  Positive JVD. LUNGS:  He has bibasilar rales. CARDIOVASCULAR:  Irregularly irregular.  S1, S2 was normal.  There was soft systolic murmur and S3 gallop. ABDOMEN:  Soft.  Bowel sounds are present.  Nontender. EXTREMITIES:  There is no clubbing, cyanosis, or edema.  LABS:  Sodium was 142, potassium 4.6, BUN 30, creatinine 1.55.  His 3 sets of cardiac enzymes, troponin I were negative.  BNP was 5177. Repeat proBNP today is 418.  His electrolytes today, sodium 136, potassium 3.6, BUN 39, creatinine 1.97, glucose is 107, hemoglobin is 15.7, hematocrit 44.9, white count  of 6.1.  Admission PT was 13.9, INR of 1.05.  Today PT/INR, PT is 25.2, INR is 2.24.  His TSH is in therapeutic range 2.38.  Chest x-ray showed borderline cardiomegaly with pulmonary vascular congestion.  BRIEF HOSPITAL COURSE:  The patient was admitted to telemetry unit, was started on IV Lasix and continued on his home medication with good diuresis.  The patient was started on IV heparin and Coumadin was restarted per pharmacy protocol.  His proBNP is trending  down. Clinically his exams suggest no evidence of heart failure.  The patient remained in AFib during the hospital stay with controlled ventricular response.  The patient's INR is in therapeutic range and will be discharged home on above medications.  The patient was discussed briefly regarding TEE-assisted cardioversion again but due to dilated left atrium and very high risk of reoccurrence of recurrent atrial fibrillation, the patient decided to be treated medically with rate controlled and chronic anticoagulation.  The patient will be discharged home on above medications and will be followed up in my office in 1 week.     [name]. [name], M.D.     [facility]/MEDQ  D:  02/23/2011  T:  02/24/2011  Job:  [id number]

## 2011-03-02 IMAGING — CR DG CHEST 2V
2 series · 2 of 2 positions shown · non-contrast
Comparison: 09/05/2009

CLINICAL DATA: Short of breath

CHEST - 2 VIEW

[w chest pa]
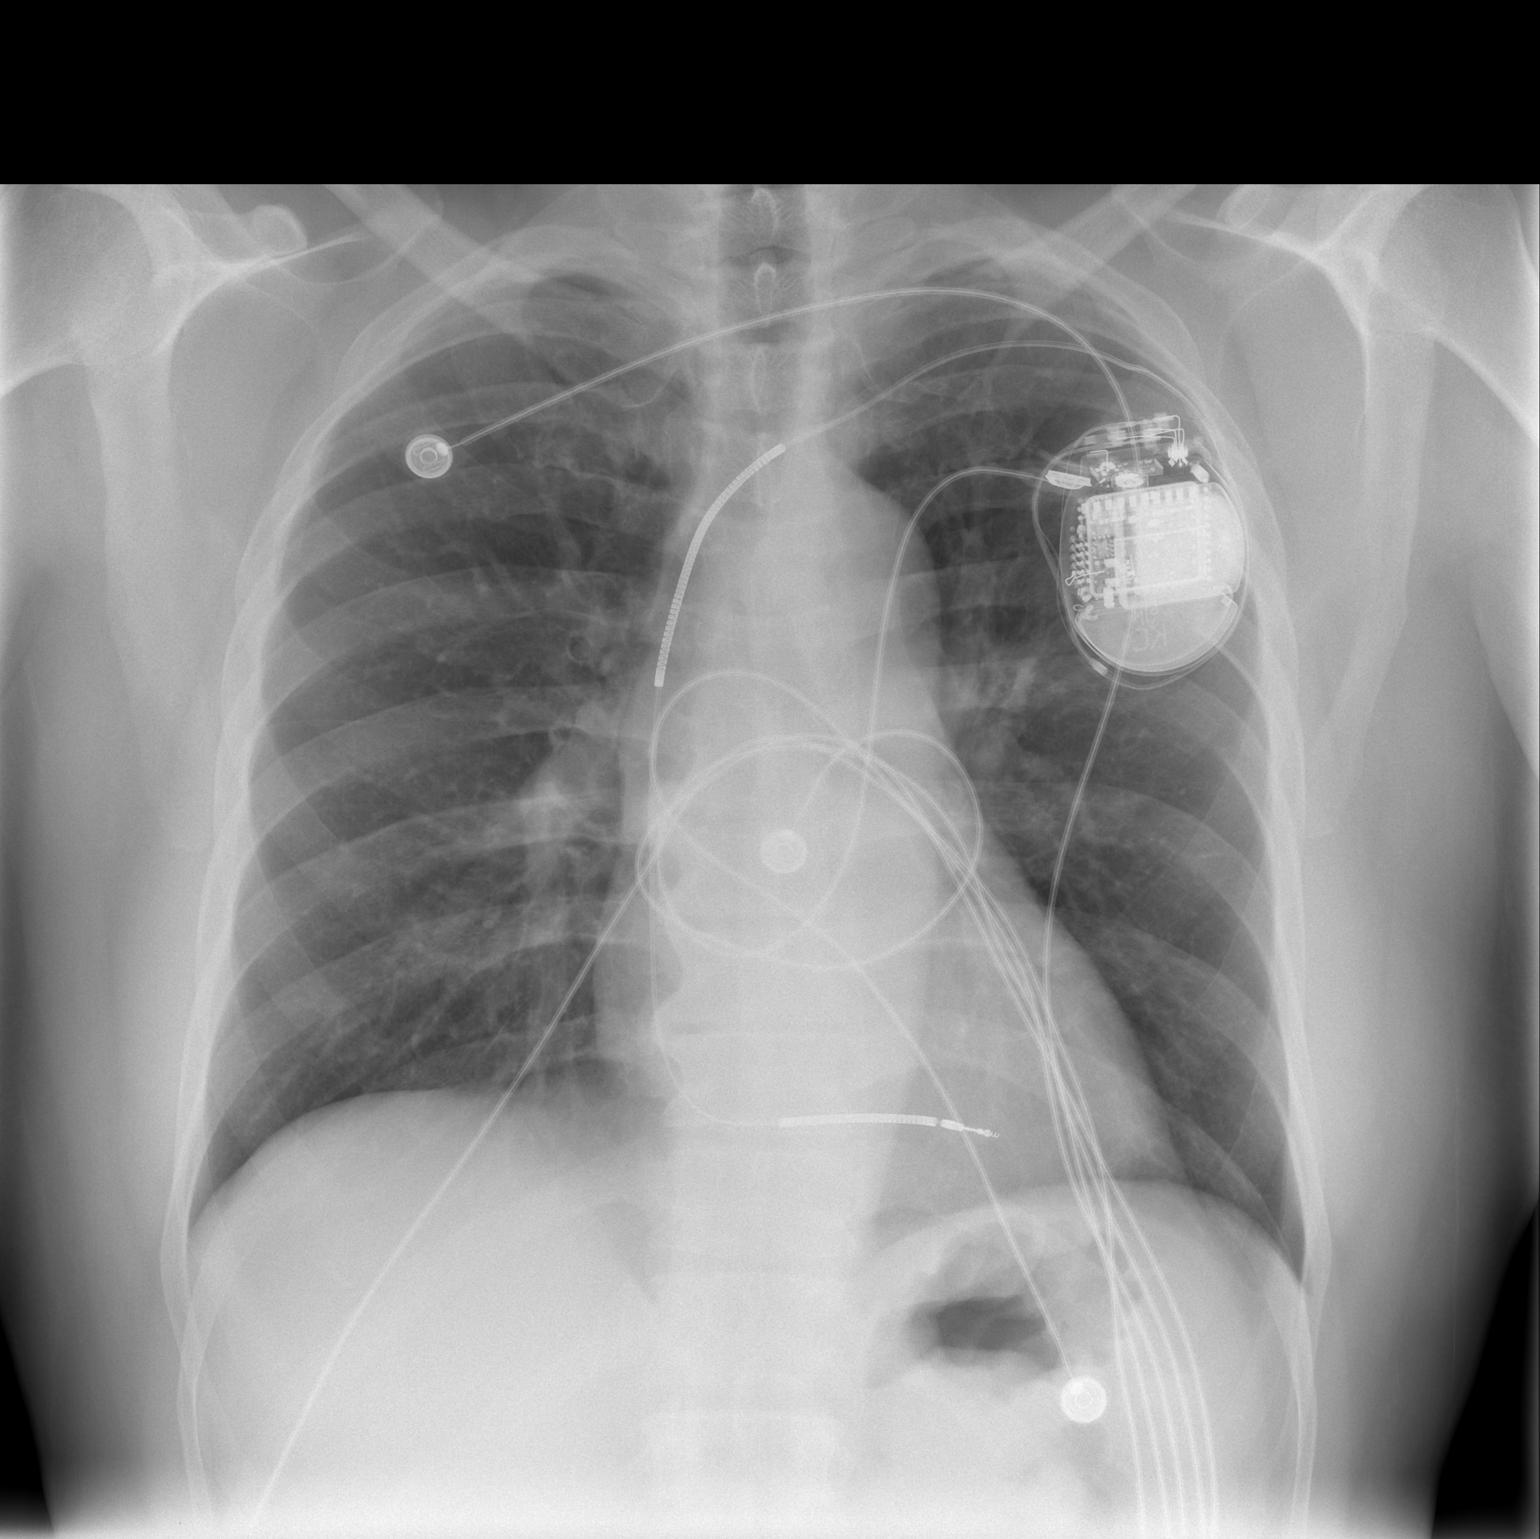

[w chest lat]
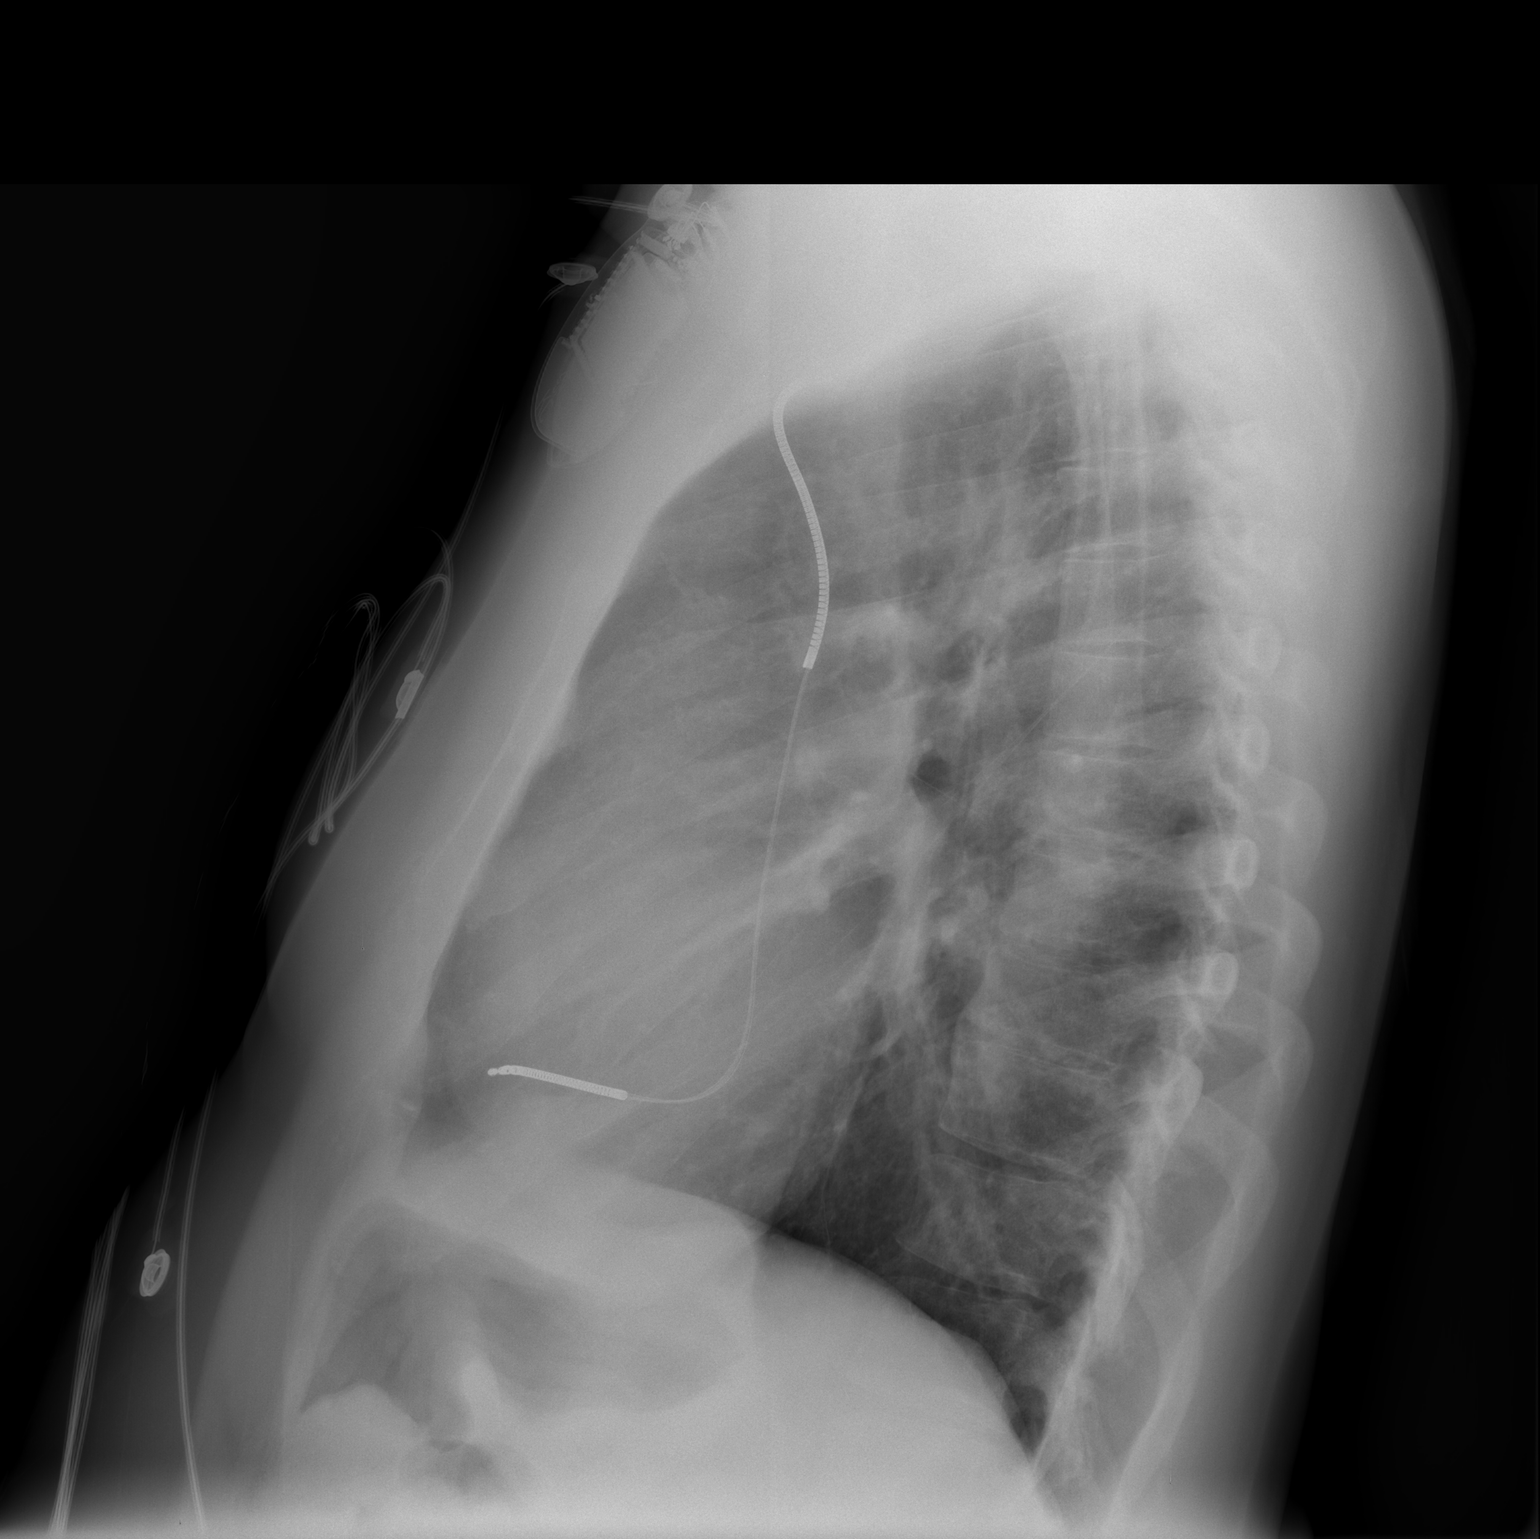

[2 of 2 positions shown; findings below may reference images not displayed]

FINDINGS: Stable left subclavian AICD device.  Upper normal heart
size.  Clear lungs.  Bronchitic changes.
IMPRESSION: No active cardiopulmonary disease.

## 2011-03-16 ENCOUNTER — Telehealth: Payer: Self-pay | Admitting: Internal Medicine

## 2011-03-16 NOTE — Telephone Encounter (Signed)
Please return call to patient at [phone] Until 3:30pm    Patient experiencing frequent A-Fib, would like to be seen

## 2011-03-16 NOTE — Telephone Encounter (Signed)
Spoke with pt. He was hospitalized in February and found to be in A-fib. Had TEE cardioversion in January 2013. He states he is now in Afib and wondering if he needs to be cardioverted again. He has seen Dr. [name] since hospitalization and has appt to see him next week.   No symptoms now and states this has been ongoing.  He is taking Coumadin.  I asked pt to contact Dr. [name] regarding on going Afib. Dr. [name] can then contact us if he feels pt needs to be seen prior to his next scheduled appt. Pt agreeable with this plan

## 2011-04-17 ENCOUNTER — Other Ambulatory Visit: Payer: Self-pay | Admitting: Cardiology

## 2011-04-25 ENCOUNTER — Inpatient Hospital Stay (HOSPITAL_COMMUNITY)
Admission: EM | Admit: 2011-04-25 | Discharge: 2011-04-28 | DRG: 292 | Disposition: A | Discharge status: Home / Self Care | Payer: PRIVATE HEALTH INSURANCE | Attending: Cardiology | Admitting: Cardiology

## 2011-04-25 ENCOUNTER — Emergency Department (HOSPITAL_COMMUNITY): Payer: PRIVATE HEALTH INSURANCE

## 2011-04-25 ENCOUNTER — Encounter (HOSPITAL_COMMUNITY): Payer: Self-pay | Admitting: Emergency Medicine

## 2011-04-25 DIAGNOSIS — I4729 Other ventricular tachycardia: Secondary | ICD-10-CM | POA: Diagnosis present

## 2011-04-25 DIAGNOSIS — I5023 Acute on chronic systolic (congestive) heart failure: Principal | ICD-10-CM | POA: Diagnosis present

## 2011-04-25 DIAGNOSIS — I252 Old myocardial infarction: Secondary | ICD-10-CM

## 2011-04-25 DIAGNOSIS — Z91199 Patient's noncompliance with other medical treatment and regimen due to unspecified reason: Secondary | ICD-10-CM

## 2011-04-25 DIAGNOSIS — E739 Lactose intolerance, unspecified: Secondary | ICD-10-CM | POA: Diagnosis present

## 2011-04-25 DIAGNOSIS — D869 Sarcoidosis, unspecified: Secondary | ICD-10-CM | POA: Diagnosis present

## 2011-04-25 DIAGNOSIS — I2510 Atherosclerotic heart disease of native coronary artery without angina pectoris: Secondary | ICD-10-CM | POA: Diagnosis present

## 2011-04-25 DIAGNOSIS — I129 Hypertensive chronic kidney disease with stage 1 through stage 4 chronic kidney disease, or unspecified chronic kidney disease: Secondary | ICD-10-CM | POA: Diagnosis present

## 2011-04-25 DIAGNOSIS — M109 Gout, unspecified: Secondary | ICD-10-CM | POA: Diagnosis present

## 2011-04-25 DIAGNOSIS — Z95810 Presence of automatic (implantable) cardiac defibrillator: Secondary | ICD-10-CM

## 2011-04-25 DIAGNOSIS — I472 Ventricular tachycardia, unspecified: Secondary | ICD-10-CM | POA: Diagnosis present

## 2011-04-25 DIAGNOSIS — E7800 Pure hypercholesterolemia, unspecified: Secondary | ICD-10-CM | POA: Diagnosis present

## 2011-04-25 DIAGNOSIS — N184 Chronic kidney disease, stage 4 (severe): Secondary | ICD-10-CM | POA: Diagnosis present

## 2011-04-25 DIAGNOSIS — K219 Gastro-esophageal reflux disease without esophagitis: Secondary | ICD-10-CM | POA: Diagnosis present

## 2011-04-25 DIAGNOSIS — I509 Heart failure, unspecified: Secondary | ICD-10-CM | POA: Diagnosis present

## 2011-04-25 DIAGNOSIS — Z9119 Patient's noncompliance with other medical treatment and regimen: Secondary | ICD-10-CM

## 2011-04-25 DIAGNOSIS — I428 Other cardiomyopathies: Secondary | ICD-10-CM | POA: Diagnosis present

## 2011-04-25 DIAGNOSIS — I4891 Unspecified atrial fibrillation: Secondary | ICD-10-CM | POA: Diagnosis present

## 2011-04-25 LAB — CARDIAC PANEL(CRET KIN+CKTOT+MB+TROPI)
CK, MB: 11.1 ng/mL (ref 0.3–4.0)
Relative Index: 2 (ref 0.0–2.5)
Total CK: 567 U/L — ABNORMAL HIGH (ref 7–232)
Troponin I: <0.3 ng/mL (ref <0.30)

## 2011-04-25 LAB — CBC
HCT: 38.7 % — ABNORMAL LOW (ref 39.0–52.0)
MCH: 31.3 pg (ref 26.0–34.0)
MCV: 91.1 fL (ref 78.0–100.0)
Platelets: 202 10*3/uL (ref 150–400)
RBC: 4.25 MIL/uL (ref 4.22–5.81)
RDW: 14.3 % (ref 11.5–15.5)
WBC: 6.3 10*3/uL (ref 4.0–10.5)

## 2011-04-25 LAB — DIGOXIN LEVEL: Digoxin Level: 0.3 ng/mL — ABNORMAL LOW (ref 0.8–2.0)

## 2011-04-25 LAB — COMPREHENSIVE METABOLIC PANEL
AST: 39 U/L — ABNORMAL HIGH (ref 0–37)
BUN: 30 mg/dL — ABNORMAL HIGH (ref 6–23)
CO2: 22 mEq/L (ref 19–32)
Calcium: 9.8 mg/dL (ref 8.4–10.5)
Chloride: 108 mEq/L (ref 96–112)
Creatinine, Ser: 1.93 mg/dL — ABNORMAL HIGH (ref 0.50–1.35)
GFR calc non Af Amer: 36 mL/min — ABNORMAL LOW (ref >90)
Total Bilirubin: 0.7 mg/dL (ref 0.3–1.2)

## 2011-04-25 LAB — DIFFERENTIAL
Basophils Absolute: 0 10*3/uL (ref 0.0–0.1)
Basophils Relative: 0 % (ref 0–1)
Lymphocytes Relative: 42 % (ref 12–46)
Neutro Abs: 2.6 10*3/uL (ref 1.7–7.7)
Neutrophils Relative %: 46 % (ref 43–77)

## 2011-04-25 MED ORDER — FUROSEMIDE 10 MG/ML IJ SOLN
80.0000 mg | Freq: Once | INTRAMUSCULAR | Status: AC
  Administered 2011-04-25: 80 mg via INTRAVENOUS
  Filled 2011-04-25: qty 8

## 2011-04-25 MED ORDER — WARFARIN SODIUM 10 MG PO TABS
10.0000 mg | ORAL_TABLET | ORAL | Status: AC
  Administered 2011-04-25: 10 mg via ORAL
  Filled 2011-04-25 (×2): qty 1

## 2011-04-25 MED ORDER — WARFARIN SODIUM 10 MG PO TABS
10.0000 mg | ORAL_TABLET | Freq: Every day | ORAL | Status: DC
  Administered 2011-04-26: 10 mg via ORAL
  Filled 2011-04-25 (×3): qty 1

## 2011-04-25 MED ORDER — WARFARIN - PHARMACIST DOSING INPATIENT: Freq: Every day | Status: DC

## 2011-04-25 MED ORDER — ASPIRIN 81 MG PO CHEW
324.0000 mg | CHEWABLE_TABLET | Freq: Once | ORAL | Status: AC
  Administered 2011-04-25: 324 mg via ORAL
  Filled 2011-04-25: qty 4

## 2011-04-25 NOTE — ED Provider Notes (Signed)
History     CSN: [id number]  Arrival date & time 04/25/11  1757   First MD Initiated Contact with Patient 04/25/11 1832      Chief Complaint  Patient presents with  . Chest Pain    (Consider location/radiation/quality/duration/timing/severity/associated sxs/prior treatment) HPI Comments: Patient presents with three-day history of chest pain, shortness of breath, dyspnea on exertion, fatigue, PND orthopnea. 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 He normally takes the Lasix twice a day but states he is not responding to it for the past couple days has taken 100 twice a day. He is a constant left-sided chest pain for the past 3 days and is unable walk around his house which is normally able to. Had increased swelling in his hands, feet and abdomen. Good by mouth intake or decreased urine output. Patient of Dr. [name].  The history is provided by the patient.    Past Medical History  Diagnosis Date  . Hypertension   . CHF (congestive heart failure)   . Sarcoidosis   . Cardiomyopathy     non ischemic by cath  . Acute on chronic systolic heart failure   . Automatic implantable cardiac defibrillator in situ   . Atrial fibrillation   . NSVT (nonsustained ventricular tachycardia)   . Chronic kidney disease (CKD), stage III (moderate)   . GERD (gastroesophageal reflux disease)   . Hypercholesteremia   . Coronary artery disease   . Myocardial infarction   . Shortness of breath     Past Surgical History  Procedure Date  . Back surgery 1987    Ruptured disk repair  . Pacemaker insertion     with ICD  . Tee without cardioversion 01/17/2011    Procedure: TRANSESOPHAGEAL ECHOCARDIOGRAM (TEE);  Surgeon: Ricki Rodriguez, MD;  Location: [facility] ENDOSCOPY;  Service: Cardiovascular;  Laterality: N/A;  . Cardioversion 01/17/2011    Procedure: CARDIOVERSION;  Surgeon: Ricki Rodriguez, MD;  Location: [facility] ENDOSCOPY;  Service: Cardiovascular;  Laterality: N/A;    Family  History  Problem Relation Age of Onset  . Heart disease    . Heart failure    . Stroke    . Anesthesia problems Neg Hx   . Hypotension Neg Hx   . Malignant hyperthermia Neg Hx   . Pseudochol deficiency Neg Hx     History  Substance Use Topics  . Smoking status: Never Smoker   . Smokeless tobacco: Not on file  . Alcohol Use: No      Review of Systems  Constitutional: Positive for activity change and fatigue. Negative for fever.  HENT: Negative for rhinorrhea.   Respiratory: Positive for cough, chest tightness and shortness of breath.   Cardiovascular: Positive for chest pain and leg swelling.  Gastrointestinal: Negative for nausea, vomiting and abdominal pain.  Genitourinary: Negative for dysuria and hematuria.  Musculoskeletal: Negative for back pain.  Skin: Negative for rash.  Neurological: Negative for syncope and headaches.    Allergies  Review of patient's allergies indicates no known allergies.  Home Medications   Current Outpatient Rx  Name Route Sig Dispense Refill  . ALLOPURINOL 100 MG PO TABS Oral Take 1 tablet (100 mg total) by mouth daily. 30 tablet 3  . AMIODARONE HCL 200 MG PO TABS Oral Take 200 mg by mouth daily.    . ASPIRIN EC 81 MG PO TBEC Oral Take 81 mg by mouth daily.    Marland Kitchen CARVEDILOL 6.25 MG PO TABS Oral Take 6.25 mg  by mouth 2 (two) times daily with a meal.    . COLCHICINE 0.6 MG PO TABS Oral Take 0.6 mg by mouth daily.    Marland Kitchen DIGOXIN 0.25 MG PO TABS Oral Take 250 mcg by mouth daily.     . FUROSEMIDE 80 MG PO TABS Oral Take 80 mg by mouth 2 (two) times daily.    . ISOSORB DINITRATE-HYDRALAZINE 20-37.5 MG PO TABS Oral Take 1 tablet by mouth daily.    Marland Kitchen PANTOPRAZOLE SODIUM 40 MG PO TBEC Oral Take 40 mg by mouth daily at 12 noon.    Marland Kitchen POTASSIUM CHLORIDE CRYS ER 20 MEQ PO TBCR Oral Take 20 mEq by mouth 2 (two) times daily.    Marland Kitchen RAMIPRIL 2.5 MG PO CAPS Oral Take 2.5 mg by mouth daily.    Marland Kitchen ROSUVASTATIN CALCIUM 5 MG PO TABS Oral Take 5 mg by mouth at  bedtime.     . SUCRALFATE 1 G PO TABS Oral Take 1 g by mouth 4 (four) times daily -  with meals and at bedtime.     . WARFARIN SODIUM 10 MG PO TABS Oral Take 10 mg by mouth at bedtime.     Marland Kitchen ZOLPIDEM TARTRATE 10 MG PO TABS Oral Take 10 mg by mouth at bedtime as needed. For sleep      BP 131/97  Pulse 119  Temp(Src) 98 F (36.7 C) (Oral)  Resp 22  SpO2 93%  Physical Exam  Constitutional: He is oriented to person, place, and time. He appears well-developed and well-nourished. No distress.  HENT:  Head: Normocephalic.  Mouth/Throat: Oropharynx is clear and moist. No oropharyngeal exudate.  Eyes: Conjunctivae are normal. Pupils are equal, round, and reactive to light.  Neck: Normal range of motion. Neck supple. JVD present.  Cardiovascular: Normal rate, regular rhythm and normal heart sounds.   Pulmonary/Chest: Effort normal. He has rales.       Bibasilar crackle  Abdominal: Soft. There is no tenderness. There is no rebound and no guarding.  Musculoskeletal: He exhibits edema and tenderness.       +2 pedal edema  Neurological: He is alert and oriented to person, place, and time.  Skin: Skin is warm.    ED Course  Procedures (including critical care time)  Labs Reviewed  CBC - Abnormal; Notable for the following:    HCT 38.7 (*)    All other components within normal limits  COMPREHENSIVE METABOLIC PANEL - Abnormal; Notable for the following:    Glucose, Bld 114 (*)    BUN 30 (*)    Creatinine, Ser 1.93 (*)    Albumin 3.3 (*)    AST 39 (*)    GFR calc non Af Amer 36 (*)    GFR calc Af Amer 41 (*)    All other components within normal limits  CARDIAC PANEL(CRET KIN+CKTOT+MB+TROPI) - Abnormal; Notable for the following:    Total CK 567 (*)    CK, MB 11.1 (*)    All other components within normal limits  PRO B NATRIURETIC PEPTIDE - Abnormal; Notable for the following:    Pro B Natriuretic peptide (BNP) 9779.0 (*)    All other components within normal limits  PROTIME-INR -  Abnormal; Notable for the following:    Prothrombin Time 26.2 (*)    INR 2.36 (*)    All other components within normal limits  DIFFERENTIAL  POCT I-STAT TROPONIN I  DIGOXIN LEVEL   Dg Chest 2 View  04/25/2011  *RADIOLOGY REPORT*  Clinical Data: Chest pain  CHEST - 2 VIEW  Comparison: 02/19/2011  Findings: Left chest wall AICD is noted with lead in the right ventricle.  The heart size appears mildly enlarged.  No pleural effusion or edema identified.  No airspace consolidation identified.  IMPRESSION:  1.  No active cardiopulmonary abnormality. 2.  Mild cardiac enlargement.  Original Report Authenticated By: Rosealee Albee, M.D.     No diagnosis found.    MDM  Chest pain, shortness of breath, PND, orthopnea dyspnea on exertion. Vitals stable, no distress. Atrial fibrillation with variable response.  Acute on chronic heart failure from nonischemic cardio myopathy.Volume overload on exam.  BNP elevated, increased PND and orthopnea. DOubt MI. Doubt PE given therapeutic INR.  I discussed with Dr. [name] who recommended a second dose of 80 mg lasix and reassessment. Patient with clean cath and does not think he needs rule out.  He received 160 mg of Lasix and had 1200 cc of urine output. He still remains short of breath and unable to walk. We'll discuss admission with Dr. [name].   Date: 04/25/2011  Rate: 109  Rhythm: atrial fibrillation  QRS Axis: normal  Intervals: QT prolonged  ST/T Wave abnormalities: nonspecific ST/T changes and ST depressions laterally  Conduction Disutrbances:none  Narrative Interpretation: Lateral ST depressions and T-wave inversions unchanged  Old EKG Reviewed: unchanged          Glynn Octave, MD 04/26/11 1121

## 2011-04-25 NOTE — ED Notes (Signed)
Patient transported to X-ray 

## 2011-04-25 NOTE — ED Notes (Signed)
Pt here from home with c/o chest pain and sob that started 2 days ago. Pt has hx ofchf and takes lasix 80mg  bid

## 2011-04-25 NOTE — ED Notes (Signed)
Placed pt on 2L O2 for O2 sat ranging from 86-97%.

## 2011-04-25 NOTE — H&P (Signed)
[name] is an 61 y.o. male.   Chief Complaint: Atypical chest pain/progressive increasing shortness of breath HPI: Patient is 61 year old male with past medical history significant for multiple medical problems i.e. nonischemic dilated cardiomyopathy status post ICD in the past history of recurrent acute systolic heart failure secondary to noncompliance to diet medication, hypertension, chronic atrial fibrillation, glucose intolerance, history of nonsustained VT, GERD of chronic kidney disease stage IV, history of sarcoidosis, history of gouty arthritis, came to the ER complaining of progressive increasing shortness of breath associated with and and abdominal swelling associated with vague left-sided chest pain off and on for last 3 days. Patient also gives history of PND orthopnea. Denies any fever or chills. Denies any urinary complaints. Denies any noncompliance to medication or excessive salty food intake. States he increased the dose of Lasix 200 mg twice daily despite that his breathing progressively got worse and could barely walk in the house without getting shortness of breath so decided to come to the ER. Patient was noted to be in mild acute decompensated systolic heart failure with elevated BNP of of 9000  Past Medical History  Diagnosis Date  . Hypertension   . CHF (congestive heart failure)   . Sarcoidosis   . Cardiomyopathy     non ischemic by cath  . Acute on chronic systolic heart failure   . Automatic implantable cardiac defibrillator in situ   . Atrial fibrillation   . NSVT (nonsustained ventricular tachycardia)   . Chronic kidney disease (CKD), stage III (moderate)   . GERD (gastroesophageal reflux disease)   . Hypercholesteremia   . Coronary artery disease   . Myocardial infarction   . Shortness of breath     Past Surgical History  Procedure Date  . Back surgery 1987    Ruptured disk repair  . Pacemaker insertion     with ICD  . Tee without cardioversion  01/17/2011    Procedure: TRANSESOPHAGEAL ECHOCARDIOGRAM (TEE);  Surgeon: Ricki Rodriguez, MD;  Location: [facility] ENDOSCOPY;  Service: Cardiovascular;  Laterality: N/A;  . Cardioversion 01/17/2011    Procedure: CARDIOVERSION;  Surgeon: Ricki Rodriguez, MD;  Location: [facility] ENDOSCOPY;  Service: Cardiovascular;  Laterality: N/A;    Family History  Problem Relation Age of Onset  . Heart disease    . Heart failure    . Stroke    . Anesthesia problems Neg Hx   . Hypotension Neg Hx   . Malignant hyperthermia Neg Hx   . Pseudochol deficiency Neg Hx    Social History:  reports that he has never smoked. He does not have any smokeless tobacco history on file. He reports that he does not drink alcohol or use illicit drugs.  Allergies: No Known Allergies   (Not in a hospital admission)  Results for orders placed during the hospital encounter of 04/25/11 (from the past 48 hour(s))  CBC     Status: Abnormal   Collection Time   04/25/11  6:18 PM      Component Value Range Comment   WBC 6.3  4.0 - 10.5 (K/uL)    RBC 4.25  4.22 - 5.81 (MIL/uL)    Hemoglobin 13.3  13.0 - 17.0 (g/dL)    HCT 16.1 (*) 09.6 - 52.0 (%)    MCV 91.1  78.0 - 100.0 (fL)    MCH 31.3  26.0 - 34.0 (pg)    MCHC 34.4  30.0 - 36.0 (g/dL)    RDW 04.5  40.9 - 81.1 (%)  Platelets 202  150 - 400 (K/uL)   COMPREHENSIVE METABOLIC PANEL     Status: Abnormal   Collection Time   04/25/11  6:18 PM      Component Value Range Comment   Sodium 141  135 - 145 (mEq/L)    Potassium 4.4  3.5 - 5.1 (mEq/L)    Chloride 108  96 - 112 (mEq/L)    CO2 22  19 - 32 (mEq/L)    Glucose, Bld 114 (*) 70 - 99 (mg/dL)    BUN 30 (*) 6 - 23 (mg/dL)    Creatinine, Ser 1.61 (*) 0.50 - 1.35 (mg/dL)    Calcium 9.8  8.4 - 10.5 (mg/dL)    Total Protein 7.3  6.0 - 8.3 (g/dL)    Albumin 3.3 (*) 3.5 - 5.2 (g/dL)    AST 39 (*) 0 - 37 (U/L)    ALT 30  0 - 53 (U/L)    Alkaline Phosphatase 62  39 - 117 (U/L)    Total Bilirubin 0.7  0.3 - 1.2 (mg/dL)    GFR calc non Af  Amer 36 (*) >90 (mL/min)    GFR calc Af Amer 41 (*) >90 (mL/min)   DIFFERENTIAL     Status: Normal   Collection Time   04/25/11  6:34 PM      Component Value Range Comment   Neutrophils Relative 46  43 - 77 (%)    Neutro Abs 2.6  1.7 - 7.7 (K/uL)    Lymphocytes Relative 42  12 - 46 (%)    Lymphs Abs 2.4  0.7 - 4.0 (K/uL)    Monocytes Relative 11  3 - 12 (%)    Monocytes Absolute 0.6  0.1 - 1.0 (K/uL)    Eosinophils Relative 1  0 - 5 (%)    Eosinophils Absolute 0.1  0.0 - 0.7 (K/uL)    Basophils Relative 0  0 - 1 (%)    Basophils Absolute 0.0  0.0 - 0.1 (K/uL)   PROTIME-INR     Status: Abnormal   Collection Time   04/25/11  6:34 PM      Component Value Range Comment   Prothrombin Time 26.2 (*) 11.6 - 15.2 (seconds)    INR 2.36 (*) 0.00 - 1.49    POCT I-STAT TROPONIN I     Status: Normal   Collection Time   04/25/11  6:34 PM      Component Value Range Comment   Troponin i, poc 0.04  0.00 - 0.08 (ng/mL)    Comment 3            CARDIAC PANEL(CRET KIN+CKTOT+MB+TROPI)     Status: Abnormal   Collection Time   04/25/11  6:35 PM      Component Value Range Comment   Total CK 567 (*) 7 - 232 (U/L)    CK, MB 11.1 (*) 0.3 - 4.0 (ng/mL)    Troponin I <0.30  <0.30 (ng/mL)    Relative Index 2.0  0.0 - 2.5    PRO B NATRIURETIC PEPTIDE     Status: Abnormal   Collection Time   04/25/11  6:35 PM      Component Value Range Comment   Pro B Natriuretic peptide (BNP) 9779.0 (*) 0 - 125 (pg/mL)   DIGOXIN LEVEL     Status: Abnormal   Collection Time   04/25/11  6:35 PM      Component Value Range Comment   Digoxin Level 0.3 (*) 0.8 - 2.0 (ng/mL)  Dg Chest 2 View  04/25/2011  *RADIOLOGY REPORT*  Clinical Data: Chest pain  CHEST - 2 VIEW  Comparison: 02/19/2011  Findings: Left chest wall AICD is noted with lead in the right ventricle.  The heart size appears mildly enlarged.  No pleural effusion or edema identified.  No airspace consolidation identified.  IMPRESSION:  1.  No active cardiopulmonary  abnormality. 2.  Mild cardiac enlargement.  Original Report Authenticated By: Rosealee Albee, M.D.    Review of Systems  Constitutional: Negative for fever, chills and weight loss.  HENT: Negative for hearing loss.   Eyes: Negative for blurred vision and double vision.  Respiratory: Positive for shortness of breath. Negative for cough, hemoptysis and sputum production.   Cardiovascular: Positive for chest pain, orthopnea and PND. Negative for palpitations.  Gastrointestinal: Negative for heartburn, nausea, vomiting and abdominal pain.  Genitourinary: Negative for dysuria.  Neurological: Negative for dizziness and headaches.    Blood pressure 121/94, pulse 86, temperature 98 F (36.7 C), temperature source Oral, resp. rate 20, SpO2 98.00%. Physical Exam  Constitutional: He is oriented to person, place, and time. He appears well-developed.  HENT:  Head: Normocephalic and atraumatic.  Nose: Nose normal.  Mouth/Throat: No oropharyngeal exudate.  Eyes: Conjunctivae are normal. Left eye exhibits no discharge. No scleral icterus.  Neck: Normal range of motion. Neck supple. JVD present. No tracheal deviation present. No thyromegaly present.  Cardiovascular:       Irregularly irregular S1-S2 soft no soft systolic murmur and S3 gallop  Respiratory:       Decreased breath sounds at bases with occasional basilar rales  GI: Soft. Bowel sounds are normal. He exhibits distension. There is no tenderness. There is no rebound.  Musculoskeletal:       No clubbing cyanosis trace edema  Lymphadenopathy:    He has no cervical adenopathy.  Neurological: He is alert and oriented to person, place, and time.     Assessment/Plan Acute on chronic systolic heart failure Atypical chest pain rule out MI Nonischemic dilated cardiomyopathy status post ICD Chronic atrial fibrillation History of nonsustained VT Hypertension Glucose intolerance Chronic kidney disease stage IV History of gouty  arthritis GERD History of sarcoidosis Plan As per orders Check old records  [facility] N 04/25/2011, 9:51 PM

## 2011-04-25 NOTE — Progress Notes (Signed)
ANTICOAGULATION CONSULT NOTE - Initial Consult  Pharmacy Consult for Coumadin Indication: atrial fibrillation  No Known Allergies  Patient Measurements:   Ht 75 inches Wt ~100 kg  Vital Signs: Temp: 98 F (36.7 C) (04/23 1806) Temp src: Oral (04/23 1806) BP: 121/94 mmHg (04/23 2045) Pulse Rate: 86  (04/23 2045)  Labs:  Basename 04/25/11 1835 04/25/11 1834 04/25/11 1818  HGB -- -- 13.3  HCT -- -- 38.7*  PLT -- -- 202  APTT -- -- --  LABPROT -- 26.2* --  INR -- 2.36* --  HEPARINUNFRC -- -- --  CREATININE -- -- 1.93*  CKTOTAL 567* -- --  CKMB 11.1* -- --  TROPONINI <0.30 -- --   Estimated CrCl ~ 40 ml/min The CrCl is unknown because both a height and weight (above a minimum accepted value) are required for this calculation.  Medical History: Past Medical History  Diagnosis Date  . Hypertension   . CHF (congestive heart failure)   . Sarcoidosis   . Cardiomyopathy     non ischemic by cath  . Acute on chronic systolic heart failure   . Automatic implantable cardiac defibrillator in situ   . Atrial fibrillation   . NSVT (nonsustained ventricular tachycardia)   . Chronic kidney disease (CKD), stage III (moderate)   . GERD (gastroesophageal reflux disease)   . Hypercholesteremia   . Coronary artery disease   . Myocardial infarction   . Shortness of breath     Medications:  See electronic med rec - coumadin 10mg  daily at bedtime  Assessment: 61 y.o. male presents with chest pain/SOB - acute on chronic systolic heart failure.  Pt on chronic coumadin for afib. Baseline INR therapeutic=2.36. CBC stable.  Goal of Therapy:  INR 2-3   Plan:  1. Daily INR 2. Continue home dose - coumadin 10mg  po daily. First dose tonight.  Christoper Fabian, PharmD, BCPS Clinical pharmacist, pager [phone] 04/25/2011,10:17 PM

## 2011-04-25 NOTE — ED Notes (Signed)
Pt unable to walk and check pulse ox.  Pt states he does not feel able to.

## 2011-04-25 NOTE — ED Notes (Signed)
Attempted to call report.  RN unavailable to take report and will call back.

## 2011-04-25 NOTE — ED Notes (Signed)
Notified MD Rancour about CKMB value

## 2011-04-26 ENCOUNTER — Encounter (HOSPITAL_COMMUNITY): Payer: Self-pay | Admitting: *Deleted

## 2011-04-26 LAB — CBC
MCH: 30.7 pg (ref 26.0–34.0)
MCHC: 33.9 g/dL (ref 30.0–36.0)
MCV: 90.7 fL (ref 78.0–100.0)
Platelets: 226 10*3/uL (ref 150–400)
RDW: 14.3 % (ref 11.5–15.5)
WBC: 6.5 10*3/uL (ref 4.0–10.5)

## 2011-04-26 LAB — COMPREHENSIVE METABOLIC PANEL
ALT: 32 U/L (ref 0–53)
AST: 40 U/L — ABNORMAL HIGH (ref 0–37)
Calcium: 9.9 mg/dL (ref 8.4–10.5)
Creatinine, Ser: 1.88 mg/dL — ABNORMAL HIGH (ref 0.50–1.35)
GFR calc Af Amer: 43 mL/min — ABNORMAL LOW (ref >90)
Sodium: 140 mEq/L (ref 135–145)
Total Protein: 7.8 g/dL (ref 6.0–8.3)

## 2011-04-26 LAB — BASIC METABOLIC PANEL
BUN: 29 mg/dL — ABNORMAL HIGH (ref 6–23)
CO2: 26 mEq/L (ref 19–32)
Calcium: 9.4 mg/dL (ref 8.4–10.5)
Creatinine, Ser: 1.94 mg/dL — ABNORMAL HIGH (ref 0.50–1.35)
Glucose, Bld: 93 mg/dL (ref 70–99)

## 2011-04-26 LAB — PROTIME-INR
INR: 1.99 — ABNORMAL HIGH (ref 0.00–1.49)
INR: 2.19 — ABNORMAL HIGH (ref 0.00–1.49)
Prothrombin Time: 22.9 seconds — ABNORMAL HIGH (ref 11.6–15.2)

## 2011-04-26 LAB — CARDIAC PANEL(CRET KIN+CKTOT+MB+TROPI)
CK, MB: 9.3 ng/mL (ref 0.3–4.0)
CK, MB: 7.1 ng/mL (ref 0.3–4.0)
CK, MB: 6 ng/mL — ABNORMAL HIGH (ref 0.3–4.0)
Relative Index: 2 (ref 0.0–2.5)
Total CK: 490 U/L — ABNORMAL HIGH (ref 7–232)
Total CK: 349 U/L — ABNORMAL HIGH (ref 7–232)
Total CK: 297 U/L — ABNORMAL HIGH (ref 7–232)

## 2011-04-26 LAB — DIFFERENTIAL
Basophils Absolute: 0 10*3/uL (ref 0.0–0.1)
Eosinophils Absolute: 0.2 10*3/uL (ref 0.0–0.7)
Eosinophils Relative: 4 % (ref 0–5)
Monocytes Absolute: 0.7 10*3/uL (ref 0.1–1.0)

## 2011-04-26 LAB — MAGNESIUM: Magnesium: 2 mg/dL (ref 1.5–2.5)

## 2011-04-26 LAB — PRO B NATRIURETIC PEPTIDE: Pro B Natriuretic peptide (BNP): 7359 pg/mL — ABNORMAL HIGH (ref 0–125)

## 2011-04-26 MED ORDER — PANTOPRAZOLE SODIUM 40 MG PO TBEC
40.0000 mg | DELAYED_RELEASE_TABLET | Freq: Every day | ORAL | Status: DC
  Administered 2011-04-26 – 2011-04-28 (×3): 40 mg via ORAL
  Filled 2011-04-26 (×3): qty 1

## 2011-04-26 MED ORDER — DIGOXIN 250 MCG PO TABS
0.2500 mg | ORAL_TABLET | Freq: Every day | ORAL | Status: DC
  Administered 2011-04-26 – 2011-04-28 (×3): 0.25 mg via ORAL
  Filled 2011-04-26 (×3): qty 1

## 2011-04-26 MED ORDER — ACETAMINOPHEN 325 MG PO TABS: 650.0000 mg | ORAL_TABLET | ORAL | Status: DC | PRN

## 2011-04-26 MED ORDER — ISOSORB DINITRATE-HYDRALAZINE 20-37.5 MG PO TABS
1.0000 | ORAL_TABLET | Freq: Two times a day (BID) | ORAL | Status: DC
  Administered 2011-04-26 – 2011-04-28 (×5): 1 via ORAL
  Filled 2011-04-26 (×7): qty 1

## 2011-04-26 MED ORDER — SODIUM CHLORIDE 0.9 % IJ SOLN: 3.0000 mL | INTRAMUSCULAR | Status: DC | PRN

## 2011-04-26 MED ORDER — ATORVASTATIN CALCIUM 10 MG PO TABS
10.0000 mg | ORAL_TABLET | Freq: Every day | ORAL | Status: DC
  Administered 2011-04-26 – 2011-04-27 (×2): 10 mg via ORAL
  Filled 2011-04-26 (×3): qty 1

## 2011-04-26 MED ORDER — ALLOPURINOL 100 MG PO TABS
100.0000 mg | ORAL_TABLET | Freq: Every day | ORAL | Status: DC
  Administered 2011-04-26 – 2011-04-28 (×3): 100 mg via ORAL
  Filled 2011-04-26 (×3): qty 1

## 2011-04-26 MED ORDER — SUCRALFATE 1 G PO TABS
1.0000 g | ORAL_TABLET | Freq: Three times a day (TID) | ORAL | Status: DC
  Administered 2011-04-26 – 2011-04-28 (×10): 1 g via ORAL
  Filled 2011-04-26 (×13): qty 1

## 2011-04-26 MED ORDER — POTASSIUM CHLORIDE CRYS ER 20 MEQ PO TBCR
20.0000 meq | EXTENDED_RELEASE_TABLET | Freq: Two times a day (BID) | ORAL | Status: DC
  Administered 2011-04-26 (×2): 20 meq via ORAL
  Filled 2011-04-26 (×3): qty 1

## 2011-04-26 MED ORDER — ONDANSETRON HCL 4 MG/2ML IJ SOLN: 4.0000 mg | Freq: Four times a day (QID) | INTRAMUSCULAR | Status: DC | PRN

## 2011-04-26 MED ORDER — DIGOXIN 250 MCG PO TABS: 250.0000 ug | ORAL_TABLET | Freq: Every day | ORAL | Status: DC

## 2011-04-26 MED ORDER — SODIUM CHLORIDE 0.9 % IJ SOLN
3.0000 mL | Freq: Two times a day (BID) | INTRAMUSCULAR | Status: DC
  Administered 2011-04-26 – 2011-04-27 (×5): 3 mL via INTRAVENOUS

## 2011-04-26 MED ORDER — ALPRAZOLAM 0.25 MG PO TABS: 0.2500 mg | ORAL_TABLET | Freq: Two times a day (BID) | ORAL | Status: DC | PRN

## 2011-04-26 MED ORDER — ASPIRIN EC 81 MG PO TBEC
81.0000 mg | DELAYED_RELEASE_TABLET | Freq: Every day | ORAL | Status: DC
  Administered 2011-04-26 – 2011-04-28 (×3): 81 mg via ORAL
  Filled 2011-04-26 (×3): qty 1

## 2011-04-26 MED ORDER — FUROSEMIDE 10 MG/ML IJ SOLN
80.0000 mg | Freq: Two times a day (BID) | INTRAMUSCULAR | Status: DC
  Administered 2011-04-26 – 2011-04-27 (×3): 80 mg via INTRAVENOUS
  Filled 2011-04-26 (×5): qty 8

## 2011-04-26 MED ORDER — ASPIRIN EC 81 MG PO TBEC: 81.0000 mg | DELAYED_RELEASE_TABLET | Freq: Every day | ORAL | Status: DC

## 2011-04-26 MED ORDER — POTASSIUM CHLORIDE CRYS ER 20 MEQ PO TBCR
20.0000 meq | EXTENDED_RELEASE_TABLET | Freq: Two times a day (BID) | ORAL | Status: DC
  Administered 2011-04-26 – 2011-04-28 (×5): 20 meq via ORAL
  Filled 2011-04-26 (×5): qty 1

## 2011-04-26 MED ORDER — SODIUM CHLORIDE 0.9 % IV SOLN: 250.0000 mL | INTRAVENOUS | Status: DC | PRN

## 2011-04-26 MED ORDER — CARVEDILOL 3.125 MG PO TABS
3.1250 mg | ORAL_TABLET | Freq: Two times a day (BID) | ORAL | Status: DC
  Administered 2011-04-26 – 2011-04-28 (×5): 3.125 mg via ORAL
  Filled 2011-04-26 (×7): qty 1

## 2011-04-26 NOTE — Progress Notes (Signed)
ANTICOAGULATION CONSULT NOTE - Follow Up Consult  Pharmacy Consult for coumadin Indication: atrial fibrillation  No Known Allergies  Patient Measurements: Height: 6' 3.5" (191.8 cm) Weight: 224 lb 4.8 oz (101.742 kg) IBW/kg (Calculated) : 85.65    Vital Signs: Temp: 97.6 F (36.4 C) (04/24 0622) Temp src: Oral (04/24 0021) BP: 116/72 mmHg (04/24 0622) Pulse Rate: 77  (04/24 0622)  Labs:  [name] 04/26/11 0550 04/26/11 0038 04/25/11 1835 04/25/11 1834 04/25/11 1818  HGB -- 14.2 -- -- 13.3  HCT -- 41.9 -- -- 38.7*  PLT -- 226 -- -- 202  APTT -- 44* -- -- --  LABPROT 24.7* 22.9* -- 26.2* --  INR 2.19* 1.99* -- 2.36* --  HEPARINUNFRC -- -- -- -- --  CREATININE 1.94* 1.88* -- -- 1.93*  CKTOTAL -- 490* 567* -- --  CKMB -- 9.3* 11.1* -- --  TROPONINI -- <0.30 <0.30 -- --   Estimated Creatinine Clearance: 48.5 ml/min (by C-G formula based on Cr of 1.94).   Medications:  Scheduled:    . allopurinol  100 mg Oral Daily  . aspirin  324 mg Oral Once  . aspirin EC  81 mg Oral Daily  . atorvastatin  10 mg Oral q1800  . carvedilol  3.125 mg Oral BID WC  . digoxin  0.25 mg Oral Daily  . furosemide  80 mg Intravenous Once  . furosemide  80 mg Intravenous Once  . furosemide  80 mg Intravenous BID  . isosorbide-hydrALAZINE  1 tablet Oral BID  . pantoprazole  40 mg Oral Q1200  . potassium chloride SA  20 mEq Oral BID  . sodium chloride  3 mL Intravenous Q12H  . sucralfate  1 g Oral TID WC & HS  . warfarin  10 mg Oral To Minor  . warfarin  10 mg Oral q1800  . Warfarin - Pharmacist Dosing Inpatient   Does not apply q1800  . DISCONTD: aspirin EC  81 mg Oral Daily  . DISCONTD: digoxin  250 mcg Oral Daily    Assessment: 61 y.o. male presents with chest pain/SOB - acute on chronic systolic heart failure. Pt on chronic coumadin for afib and INR therapeutic=2.19. CBC stable.  Goal of Therapy:  INR 2-3   Plan:  -Will continue coumadin 10mg /day (home regimen) -PT/INR daily for  now  Harland German, Pharm D 04/26/2011 8:23 AM

## 2011-04-26 NOTE — Progress Notes (Signed)
Noted pt had not received lunch tray.  Spoke with Christiane Ha will stated she will order meal tray for lunch.  Pt made aware.  Amanda Pea, Charity fundraiser.

## 2011-04-26 NOTE — Progress Notes (Signed)
Pt with 5 beats of V. Tach. VS stable and documented. Pt asymptomatic. Dr. [name] notified. Orders received to ensure BMET and pro-BNP were to be drawn by lab this AM. Will continue to montor.

## 2011-04-26 NOTE — Plan of Care (Signed)
Problem: Phase I Progression Outcomes Goal: EF % per last Echo/documented,Core Reminder form on chart Outcome: Completed/Met Date Met:  04/26/11 EF 20-25% per ECHO performed on 02/20/11.

## 2011-04-26 NOTE — Progress Notes (Signed)
Pt c/o chest pain to upper chest.  Dr. [name] notified.  MD stated not cardiac related.  Kcl ordered.  Will cont. To monitor.  Amanda Pea, RN

## 2011-04-26 NOTE — Progress Notes (Signed)
Subjective:  Patient complains of leg chest and upper abdominal pain increases with deep breathing. No hemoptysis. States breathing has improved abdominal and and swelling also has improved  Objective:  Vital Signs in the last 24 hours: Temp:  [97.6 F (36.4 C)-98 F (36.7 C)] 97.6 F (36.4 C) (04/24 0622) Pulse Rate:  [68-119] 91  (04/24 1235) Resp:  [20-22] 20  (04/24 0622) BP: (98-153)/(72-126) 98/73 mmHg (04/24 1235) SpO2:  [93 %-100 %] 100 % (04/24 0622) Weight:  [101.742 kg (224 lb 4.8 oz)] 101.742 kg (224 lb 4.8 oz) (04/24 0021)  Intake/Output from previous day: 04/23 0701 - 04/24 0700 In: 483 [P.O.:480; I.V.:3] Out: 3525 [Urine:3525] Intake/Output from this shift: Total I/O In: 240 [P.O.:240] Out: 650 [Urine:650]  Physical Exam: Neck: JVD - a few cm above sternal notch, no adenopathy, no carotid bruit and supple, symmetrical, trachea midline Lungs: Degrees Presser at bases with bibasilar Rales air entry improved Heart: irregularly irregular rhythm, S1, S2 normal and Soft systolic murmur and S3 gallop noted Abdomen: soft, non-tender; bowel sounds normal; no masses,  no organomegaly Extremities: extremities normal, atraumatic, no cyanosis or edema  Lab Results:  Basename 04/26/11 0038 04/25/11 1818  WBC 6.5 6.3  HGB 14.2 13.3  PLT 226 202    Basename 04/26/11 0550 04/26/11 0038  NA 141 140  K 3.3* 3.7  CL 105 104  CO2 26 26  GLUCOSE 93 90  BUN 29* 28*  CREATININE 1.94* 1.88*    Basename 04/26/11 0819 04/26/11 0038  TROPONINI <0.30 <0.30   Hepatic Function Panel  Basename 04/26/11 0038  PROT 7.8  ALBUMIN 3.7  AST 40*  ALT 32  ALKPHOS 61  BILITOT 1.0  BILIDIR --  IBILI --   No results found for this basename: CHOL in the last 72 hours No results found for this basename: PROTIME in the last 72 hours  Imaging: Imaging results have been reviewed and Dg Chest 2 View  04/25/2011  *RADIOLOGY REPORT*  Clinical Data: Chest pain  CHEST - 2 VIEW   Comparison: 02/19/2011  Findings: Left chest wall AICD is noted with lead in the right ventricle.  The heart size appears mildly enlarged.  No pleural effusion or edema identified.  No airspace consolidation identified.  IMPRESSION:  1.  No active cardiopulmonary abnormality. 2.  Mild cardiac enlargement.  Original Report Authenticated By: Rosealee Albee, M.D.    Cardiac Studies:  Assessment/Plan:  Resolving Acute on chronic systolic heart failure  Atypical chest pain rule out MI  Nonischemic dilated cardiomyopathy status post ICD  Chronic atrial fibrillation  History of nonsustained VT  Hypertension  Glucose intolerance  Chronic kidney disease stage IV  History of gouty arthritis  GERD  History of sarcoidosis Plan Continue present management Check labs in a.m.  LOS: 1 day    Shahzad Thomann N 04/26/2011, 1:09 PM

## 2011-04-26 NOTE — Progress Notes (Signed)
Pt c/o chest discomfort and Dr. [name] in to see pt at this time.  Sucrafate and ptotonix given.  MD. States not cardiac related.  Meds given are ok.  Amanda Pea, RN

## 2011-04-27 LAB — BASIC METABOLIC PANEL
GFR calc Af Amer: 43 mL/min — ABNORMAL LOW (ref >90)
GFR calc non Af Amer: 37 mL/min — ABNORMAL LOW (ref >90)
Glucose, Bld: 91 mg/dL (ref 70–99)
Potassium: 3.5 mEq/L (ref 3.5–5.1)
Sodium: 139 mEq/L (ref 135–145)

## 2011-04-27 LAB — CBC
HCT: 42.8 % (ref 39.0–52.0)
Platelets: 212 10*3/uL (ref 150–400)
RDW: 14.1 % (ref 11.5–15.5)
WBC: 5.2 10*3/uL (ref 4.0–10.5)

## 2011-04-27 LAB — PROTIME-INR
INR: 2.87 — ABNORMAL HIGH (ref 0.00–1.49)
Prothrombin Time: 30.5 seconds — ABNORMAL HIGH (ref 11.6–15.2)

## 2011-04-27 LAB — PRO B NATRIURETIC PEPTIDE: Pro B Natriuretic peptide (BNP): 2609 pg/mL — ABNORMAL HIGH (ref 0–125)

## 2011-04-27 MED ORDER — POTASSIUM CHLORIDE CRYS ER 20 MEQ PO TBCR
40.0000 meq | EXTENDED_RELEASE_TABLET | Freq: Once | ORAL | Status: AC
  Administered 2011-04-27: 40 meq via ORAL
  Filled 2011-04-27: qty 2

## 2011-04-27 MED ORDER — WARFARIN SODIUM 7.5 MG PO TABS
7.5000 mg | ORAL_TABLET | Freq: Once | ORAL | Status: AC
  Administered 2011-04-27: 7.5 mg via ORAL
  Filled 2011-04-27: qty 1

## 2011-04-27 MED ORDER — FUROSEMIDE 80 MG PO TABS
80.0000 mg | ORAL_TABLET | Freq: Two times a day (BID) | ORAL | Status: DC
  Administered 2011-04-27 – 2011-04-28 (×2): 80 mg via ORAL
  Filled 2011-04-27 (×4): qty 1

## 2011-04-27 MED ORDER — METOLAZONE 5 MG PO TABS
5.0000 mg | ORAL_TABLET | Freq: Every day | ORAL | Status: DC
  Administered 2011-04-27 – 2011-04-28 (×2): 5 mg via ORAL
  Filled 2011-04-27 (×2): qty 1

## 2011-04-27 MED ORDER — HYDROCODONE-ACETAMINOPHEN 5-325 MG PO TABS
1.0000 | ORAL_TABLET | Freq: Four times a day (QID) | ORAL | Status: DC | PRN
  Administered 2011-04-27: 1 via ORAL
  Filled 2011-04-27: qty 1

## 2011-04-27 NOTE — Progress Notes (Signed)
UR Completed. Toivo Bordon, RN, Nurse Case Manager [phone]     

## 2011-04-27 NOTE — Progress Notes (Signed)
Subjective:  Patient complains of vague abdominal pain. Denies any chest pain states breathing is improved. Had extensive workup by GI in the past which has been negative  Objective:  Vital Signs in the last 24 hours: Temp:  [97.3 F (36.3 C)-98.7 F (37.1 C)] 97.3 F (36.3 C) (04/25 0605) Pulse Rate:  [72-91] 86  (04/25 1036) Resp:  [18-20] 18  (04/25 0605) BP: (97-114)/(61-73) 106/73 mmHg (04/25 0605) SpO2:  [94 %-99 %] 95 % (04/25 0605) Weight:  [101.4 kg (223 lb 8.7 oz)] 101.4 kg (223 lb 8.7 oz) (04/25 0605)  Intake/Output from previous day: 04/24 0701 - 04/25 0700 In: 543 [P.O.:540; I.V.:3] Out: 2400 [Urine:2400] Intake/Output from this shift: Total I/O In: 3 [I.V.:3] Out: 700 [Urine:700]  Physical Exam: Neck: no adenopathy, no carotid bruit, no JVD and supple, symmetrical, trachea midline Lungs: Decreased breath sounds at bases air entry improved Heart: irregularly irregular rhythm, S1, S2 normal and Soft systolic murmur and S3 gallop noted Abdomen: soft, non-tender; bowel sounds normal; no masses,  no organomegaly Extremities: extremities normal, atraumatic, no cyanosis or edema  Lab Results:  Basename 04/27/11 0458 04/26/11 0038  WBC 5.2 6.5  HGB 14.9 14.2  PLT 212 226    Basename 04/27/11 0458 04/26/11 0550  NA 139 141  K 3.5 3.3*  CL 103 105  CO2 24 26  GLUCOSE 91 93  BUN 33* 29*  CREATININE 1.88* 1.94*    Basename 04/26/11 1617 04/26/11 0819  TROPONINI <0.30 <0.30   Hepatic Function Panel  Basename 04/26/11 0038  PROT 7.8  ALBUMIN 3.7  AST 40*  ALT 32  ALKPHOS 61  BILITOT 1.0  BILIDIR --  IBILI --   No results found for this basename: CHOL in the last 72 hours No results found for this basename: PROTIME in the last 72 hours  Imaging: Imaging results have been reviewed and Dg Chest 2 View  04/25/2011  *RADIOLOGY REPORT*  Clinical Data: Chest pain  CHEST - 2 VIEW  Comparison: 02/19/2011  Findings: Left chest wall AICD is noted with lead in  the right ventricle.  The heart size appears mildly enlarged.  No pleural effusion or edema identified.  No airspace consolidation identified.  IMPRESSION:  1.  No active cardiopulmonary abnormality. 2.  Mild cardiac enlargement.  Original Report Authenticated By: Rosealee Albee, M.D.    Cardiac Studies:  Assessment/Plan:  Resolving Acute on chronic systolic heart failure  Atypical chest pain rule out MI  Nonischemic dilated cardiomyopathy status post ICD  Chronic atrial fibrillation  History of nonsustained VT  Hypertension  Glucose intolerance  Chronic kidney disease stage IV  History of gouty arthritis  GERD  History of sarcoidosis Plan Change IV Lasix to by mouth Add Zaroxolyn per orders Check labs in a.m.  LOS: 2 days    Adreona Brand N 04/27/2011, 10:59 AM

## 2011-04-27 NOTE — Progress Notes (Signed)
ANTICOAGULATION CONSULT NOTE - Follow Up Consult  Pharmacy Consult for coumadin Indication: atrial fibrillation  No Known Allergies  Patient Measurements: Height: 6' 3.5" (191.8 cm) Weight: 223 lb 8.7 oz (101.4 kg) IBW/kg (Calculated) : 85.65    Vital Signs: Temp: 97.3 F (36.3 C) (04/25 0605) Temp src: Oral (04/25 0605) BP: 106/73 mmHg (04/25 0605) Pulse Rate: 72  (04/25 0605)  Labs:  Alvira Philips 04/27/11 0458 04/26/11 1617 04/26/11 0819 04/26/11 0550 04/26/11 0038 04/25/11 1818  HGB 14.9 -- -- -- 14.2 --  HCT 42.8 -- -- -- 41.9 38.7*  PLT 212 -- -- -- 226 202  APTT -- -- -- -- 44* --  LABPROT 30.5* -- -- 24.7* 22.9* --  INR 2.87* -- -- 2.19* 1.99* --  HEPARINUNFRC -- -- -- -- -- --  CREATININE 1.88* -- -- 1.94* 1.88* --  CKTOTAL -- 297* 349* -- 490* --  CKMB -- 6.0* 7.1* -- 9.3* --  TROPONINI -- <0.30 <0.30 -- <0.30 --   Estimated Creatinine Clearance: 50 ml/min (by C-G formula based on Cr of 1.88).  Assessment: 61 y.o. male presents with chest pain/SOB - acute on chronic systolic heart failure. Pt on chronic coumadin for afib and INR therapeutic=2.8. CBC stable. No bleeding issues have been noted. INR continues to steadily rise, will decrease dose slightly for tonight.  Goal of Therapy:  INR 2-3   Plan:  Warfarin 7.5 tonight PT/INR daily for now  Sheppard Coil PharmD. 04/27/2011 10:19 AM

## 2011-04-28 LAB — BASIC METABOLIC PANEL
Calcium: 9.6 mg/dL (ref 8.4–10.5)
GFR calc Af Amer: 37 mL/min — ABNORMAL LOW (ref >90)
GFR calc non Af Amer: 32 mL/min — ABNORMAL LOW (ref >90)
Potassium: 3.8 mEq/L (ref 3.5–5.1)
Sodium: 137 mEq/L (ref 135–145)

## 2011-04-28 LAB — CBC
MCHC: 35.1 g/dL (ref 30.0–36.0)
Platelets: 238 10*3/uL (ref 150–400)
RDW: 14 % (ref 11.5–15.5)
WBC: 5 10*3/uL (ref 4.0–10.5)

## 2011-04-28 LAB — PROTIME-INR
INR: 3.62 — ABNORMAL HIGH (ref 0.00–1.49)
Prothrombin Time: 36.6 seconds — ABNORMAL HIGH (ref 11.6–15.2)

## 2011-04-28 MED ORDER — WARFARIN SODIUM 6 MG PO TABS: 9.0000 mg | ORAL_TABLET | Freq: Every day | ORAL | Status: DC

## 2011-04-28 MED ORDER — METOLAZONE 5 MG PO TABS: 5.0000 mg | ORAL_TABLET | Freq: Every day | ORAL | Status: DC

## 2011-04-28 MED ORDER — PANTOPRAZOLE SODIUM 40 MG PO TBEC: 40.0000 mg | DELAYED_RELEASE_TABLET | Freq: Every day | ORAL | Status: DC

## 2011-04-28 MED ORDER — HYDROCODONE-ACETAMINOPHEN 5-325 MG PO TABS: 1.0000 | ORAL_TABLET | Freq: Four times a day (QID) | ORAL | Status: AC | PRN

## 2011-04-28 NOTE — Progress Notes (Signed)
ANTICOAGULATION CONSULT NOTE - Follow Up Consult  Pharmacy Consult for coumadin Indication: atrial fibrillation  No Known Allergies  Patient Measurements: Height: 6' 3.5" (191.8 cm) Weight: 221 lb 1.9 oz (100.3 kg) (a scale) IBW/kg (Calculated) : 85.65    Vital Signs: Temp: 97.6 F (36.4 C) (04/26 0604) BP: 100/75 mmHg (04/26 1009) Pulse Rate: 89  (04/26 1009)  Labs:  Basename 04/28/11 0459 04/27/11 0458 04/26/11 1617 04/26/11 0819 04/26/11 0550 04/26/11 0038  HGB 15.8 14.9 -- -- -- --  HCT 45.0 42.8 -- -- -- 41.9  PLT 238 212 -- -- -- 226  APTT -- -- -- -- -- 44*  LABPROT 36.6* 30.5* -- -- 24.7* --  INR 3.62* 2.87* -- -- 2.19* --  HEPARINUNFRC -- -- -- -- -- --  CREATININE 2.12* 1.88* -- -- 1.94* --  CKTOTAL -- -- 297* 349* -- 490*  CKMB -- -- 6.0* 7.1* -- 9.3*  TROPONINI -- -- <0.30 <0.30 -- <0.30   Estimated Creatinine Clearance: 44.4 ml/min (by C-G formula based on Cr of 2.12).  Assessment: 61 y.o. male presents with chest pain/SOB - acute on chronic systolic heart failure. Pt on chronic coumadin for Afib. Home dose was 10 mg/day. INR supratherapeutic at 3.6. CBC stable. No bleeding issues have been noted. Renal function is worsening with SCr increasing from 1.88 to 2.12. UOP 1 mL/kg/hr (625 mL). Patient is to be discharged today. Recommend holding today and tomorrows dose.   Goal of Therapy:  INR 2-3   Plan:  - Hold warfarin dose today and tomorrow - Recommend restarting warfarin Sunday at 5 mg and follow up Monday for further evaluation.  Irineo Axon. [place] D Candidate  04/28/2011 1:38 PM

## 2011-04-28 NOTE — Progress Notes (Signed)
ACE inhibitor therapy not prescribed due to worsening renal function. Sheppard Coil PharmD.

## 2011-04-28 NOTE — Progress Notes (Signed)
Nursing Note: Pt to be discharged per MD's order. Pt's IV and cardiac monitor discontinued per MD's order. Pt received all discharge information including CHF teaching booklet. Pt verbalized understanding. Will continue to monitor pt until discharged. [name] Scientist, clinical (histocompatibility and immunogenetics).

## 2011-04-28 NOTE — Progress Notes (Signed)
The agree with the above assessment and plan. INR continues to rise and now above goal. Noted dose change at discharge by cardiology. Sheppard Coil PharmD

## 2011-04-28 NOTE — Discharge Summary (Signed)
[name], [name]             ACCOUNT NO.:  [id number]  MEDICAL RECORD NO.:  [id number]  LOCATION:  4711                         FACILITY:  [facility]  PHYSICIAN:  Srihan Brutus N. [name], M.D. DATE OF BIRTH:  21-Nov-1950  DATE OF ADMISSION:  04/25/2011 DATE OF DISCHARGE:  04/28/2011                              DISCHARGE SUMMARY   ADMITTING DIAGNOSIS: 1. Acute on chronic systolic heart failure. 2. Atypical chest pain, rule out myocardial infarction. 3. 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 4. Chronic atrial fibrillation. 5. History of nonsustained ventricular tachycardia. 6. Hypertension. 7. Glucose intolerance. 8. Chronic kidney disease, stage 4. 9. History of gouty arthritis. 10.Gastroesophageal reflux disease. 11.History of sarcoidosis.  DISCHARGE HOME MEDICATIONS: 1. Warfarin 9 mg daily. 2. Zaroxolyn 5 mg 1 tablet daily. 3. Lasix 80 mg twice daily. 4. Allopurinol 100 mg daily. 5. Amiodarone 200 mg daily. 6. Enteric-coated aspirin 81 mg daily. 7. Carvedilol 6.25 mg twice daily. 8. Colchicine 0.6 mg daily. 9. Digoxin 0.25 mg 1 tablet daily. 10.BiDil 1 tablet twice daily. 11.Protonix 40 mg daily. 12.Potassium chloride 20 mEq twice daily. 13.Crestor 5 mg daily. 14.Carafate 1 g every 6 hours. 15.Ambien 10 mg 1 tablet daily as needed. 16.The patient has been advised to stop ramipril for now.  DIET:  Low-salt, low-cholesterol, 1800-calorie ADA diet.  The patient has been advised to monitor weights daily and chart, heart failure instructions have been given.  CONDITION AT DISCHARGE:  Stable.  Follow up with me in 1 week.  Check PT/INR in 1 week.  BRIEF HISTORY AND HOSPITAL COURSE:  [name] is a 61 year old male with past medical history significant for multiple medical problems, i.e., nonischemic dilated cardiomyopathy, status post ICD in the past, history of recurrent systolic heart failure secondary to noncompliance to diet and medication,  hypertension, chronic atrial fibrillation, history of recent DC cardioversion to sinus rhythm, subsequently went back into atrial fibrillation, glucose intolerance, history of nonsustained VT,  GERD, chronic kidney disease stage 4,  history of sarcoidosis, history of gouty arthritis.  He came to the ER complaining of progressive increasing shortness of breath associated with abdominal swelling associated with vague left-sided chest pain off and on for last 3 days.  The patient also gives history of PND or orthopnea.  Denies any fever or chills.  Denies any urinary complaints.  Denies any noncompliance to medication or excessive salty food intake.  States he increased the dose of Lasix 200 mg twice a day.  Despite that, his breathing progressively got worst and could barely walk in the house without getting short of breath, so decided to come to the ER.  The patient was noted to be in mild acute decompensated systolic heart failure with elevated BNP of above 9000.  PAST MEDICAL HISTORY:  As above.  EXAMINATION:  GENERAL: He was alert, awake, oriented, in no acute distress. VITAL SIGNS: Blood pressure was 121/94, pulse was 86 after receiving IV 80 of Lasix. EYES: Conjunctivae were pink. NECK: Supple.  Positive JVD. LUNGS: Decreased breath sounds at bases with basilar rales. CARDIOVASCULAR: Irregularly irregular.  S1-S2 were soft.  There was soft systolic murmur and S3 gallop. ABDOMEN: Soft, mildly distended, nontender. EXTREMITIES: There  was no clubbing, cyanosis.  There was trace edema.  ADMISSION LABS:  ProBNP was 9,779, repeat proBNP on 24th was 7359, yesterday proBNP was 2609, today proBNP is 1651 which is trending down. His 4 sets of troponin I were negative.  Sodium was 141, potassium 4.4, BUN 30, creatinine 1.93.  Today sodium is 137, potassium 3.8, BUN 34, creatinine 2.12, hemoglobin is 15.8, hematocrit 45.0, white count of 5.0.  His PT/INR; PT was 26.2, INR 2.36, yesterday  PT was 30.5, INR 2.87, today PT is 36.6, INR 3.62.  His TSH was slightly high at 6.24. We will recheck as outpatient, may need to be started on Synthroid low dose if his TSH remains elevated.  BRIEF HOSPITAL COURSE:  The patient was admitted to telemetry unit,  MI was ruled out by serial enzymes and EKG.  The patient did not had any episodes of chest pain during the hospital stay and was started on IV Lasix and home medications with good diuresis and progressive normalization of proBNP.  The patient lost approximately 10 pounds during his hospital stay in last 3 days.  The patient is ambulating in hallway without any problems.  IV Lasix was switched to p.o. and p.o. Zaroxolyn was added to his medication regimen.  His BiDil dose was also increased to once a day to twice a day.  His ACE inhibitors have been discontinued for now.  The patient will be closely followed up in my office in 1 week.  We will check his renal function.  ProBNP and INR in 1 week.  His Coumadin dose has been reduced to 9 mg daily.     Eduardo Osier. [name], M.D.     [facility]  D:  04/28/2011  T:  04/28/2011  Job:  [id number]

## 2011-04-28 NOTE — Progress Notes (Signed)
   CARE MANAGEMENT NOTE HEART FAILURE  04/28/2011   Patient:  [name], [name]   Account Number:  [id number]    Date Initiated:  04/27/2011  Documentation initiated by:  Tera Mater  Subjective/Objective Assessment:   61yo male admitted with SOB and abdominal swelling.  HX: CHF, CKD, HTN, AICD.  Pt. lives with parents in [name].   Action/Plan:   Discharge planning for [facility] RN for HF management   Anticipated DC Date:  04/30/2011  Anticipated DC Plan:  HOME W HOME HEALTH SERVICES  DC Planning Services:  CM consult    [facility] Choice:  HOME HEALTH   Choice offered to / List presented to:  C-1 Patient    HH arranged:  HH-11 PATIENT REFUSED      Status of service:  Completed, signed off  Medicare Important Message Given:  NO (If response is "NO", the following Medicare IM given date fields will be blank) Date Medicare IM Given:   Date Additional Medicare IM Given:    Discharge Disposition:  HOME/SELF CARE  Per UR Regulation:  Reviewed for med. necessity/level of care/duration of stay  If discussed at Long Length of Stay Meetings, dates discussed:    Comments:   04/28/11 1345 Spoke with pt. about HH services and HF education.  Pt. had Living Better with Heart Failure and is well educated on the material.  Pt. states he does weigh himself everyday.  He stated he had went out of town this weekend and he ate at a buffet.  He admits to eating way too much.  He states he will now watch more closely what and how much he eats.  He did not want HH services at this time.  Pt. will discharge today. Tera Mater, RN, BSN Nurse Case Manager [phone]   Initial CM contact:  04/28/2011 01:45 PM  By:  Tera Mater Initial CSW contact:     By:      Is this an INP Readmission < 30 days:  N (If "YES" please see readmission information at the bottom of note)  Patient living status prior to this admission:  FAMILY  Patient setting prior to this admission:  HOME  Comorbid  conditions being treated that contributed to this admission:  CHF, CKD, HTN, CKD, AICD  CHF Readmission Risk:  high  Type of patient education provided  HF Zone Tool / Magnet  HF Patient Education Assessment / Teach Back  Limit salt intake  Weigh daily     Patient education provided by  [facility]    Was referral made to [facility]:  N  Is the patient's PCP the same as attending:  N PCP:    Readmission < 30 Days If pt has HH, did they contact the agency before going to the ED:   Name of [facility] agency:    Was the follow-up physician visit scheduled prior to discharge:    Did the patient follow-up with the physician prior to this readmission:    Was there HF Clinic visits prior to readmission:    Were there ED visits between admissions:    Readmit type:    If unscheduled and related indicate reason for readmit:

## 2011-04-28 NOTE — Discharge Instructions (Signed)
Heart Failure Heart failure (HF) is a condition in which the heart has trouble pumping blood. This means your heart does not pump blood efficiently for your body to work well. In some cases of HF, fluid may back up into your lungs or you may have swelling (edema) in your lower legs. HF is a long-term (chronic) condition. It is important for you to take good care of yourself and follow your caregiver's treatment plan. CAUSES   Health conditions:   High blood pressure (hypertension) causes the heart muscle to work harder than normal. When pressure in the blood vessels is high, the heart needs to pump (contract) with more force in order to circulate blood throughout the body. High blood pressure eventually causes the heart to become stiff and weak.   Coronary artery disease (CAD) is the buildup of cholesterol and fat (plaques) in the arteries of the heart. The blockage in the arteries deprives the heart muscle of oxygen and blood. This can cause chest pain and may lead to a heart attack. High blood pressure can also contribute to CAD.   Heart attack (myocardial infarction) occurs when 1 or more arteries in the heart become blocked. The loss of oxygen damages the muscle tissue of the heart. When this happens, part of the heart muscle dies. The injured tissue does not contract as well and weakens the heart's ability to pump blood.   Abnormal heart valves can cause HF when the heart valves do not open and close properly. This makes the heart muscle pump harder to keep the blood flowing.   Heart muscle disease (cardiomyopathy or myocarditis) is damage to the heart muscle from a variety of causes. These can include drug or alcohol abuse, infections, or unknown reasons. These can increase the risk of HF.   Lung disease makes the heart work harder because the lungs do not work properly. This can cause a strain on the heart leading it to fail.   Diabetes increases the risk of HF. High blood sugar contributes  to high fat (lipid) levels in the blood. Diabetes can also cause slow damage to tiny blood vessels that carry important nutrients to the heart muscle. When the heart does not get enough oxygen and food, it can cause the heart to become weak and stiff. This leads to a heart that does not contract efficiently.   Other diseases can contribute to HF. These include abnormal heart rhythms, thyroid problems, and low blood counts (anemia).   Unhealthy lifestyle habits:   Obesity.   Smoking.   Eating foods high in fat and cholesterol.   Eating or drinking beverages high in salt.   Drug or alcohol abuse.   Lack of exercise.  SYMPTOMS  HF symptoms may vary and can be hard to detect. Symptoms may include:  Shortness of breath with activity, such as climbing stairs.   Persistent cough.   Swelling of the feet, ankles, legs, or abdomen.   Unexplained weight gain.   Difficulty breathing when lying flat.   Waking from sleep because of the need to sit up and get more air.   Rapid heartbeat.   Fatigue and loss of energy.   Feeling lightheaded or close to fainting.  DIAGNOSIS  A diagnosis of HF is based on your history, symptoms, physical examination, and diagnostic tests. Diagnostic tests for HF may include:  EKG.   Chest X-ray.   Blood tests.   Exercise stress test.   Blood oxygen test (arterial blood gas).   Evaluation   by a heart doctor (cardiologist).   Ultrasound evaluation of the heart (echocardiogram).   Heart artery test to look for blockages (angiogram).   Radioactive imaging to look at the heart (radionuclide test).  TREATMENT  Treatment is aimed at managing the symptoms of HF. Medicines, lifestyle changes, or surgical intervention may be necessary to treat HF.  Medicines to help treat HF may include:   Angiotensin-converting enzyme (ACE) inhibitors. These block the effects of a blood protein called angiotensin-converting enzyme. ACE inhibitors relax (dilate) the  blood vessels and help lower blood pressure. This decreases the workload of the heart, slows the progression of HF, and improves symptoms.   Angiotensin receptor blockers (ARBs). These medications work similar to ACE inhibitors. ARBs may be an alternative for people who cannot tolerate an ACE inhibitor.   Aldosterone antagonists. This medication helps get rid of extra fluid from your body. This lowers the volume of blood the heart has to pump.   Water pills (diuretics). Diuretics cause the kidneys to remove salt and water from the blood. The extra fluid is removed by urination. By removing extra fluid from the body, diuretics help lower the workload of the heart and help prevent fluid buildup in the lungs so breathing is easier.   Beta blockers. These prevent the heart from beating too fast and improve heart muscle strength. Beta blockers help maintain a normal heart rate, control blood pressure, and improve HF symptoms.   Digitalis. This increases the force of the heartbeat and may be helpful to people with HF or heart rhythm problems.   Healthy lifestyle changes include:   Stopping smoking.   Eating a healthy diet. Avoid foods high in fat. Avoid foods fried in oil or made with fat. A dietician can help with healthy food choices.   Limiting how much salt you eat.   Limiting alcohol intake to no more than 1 drink per day for women and 2 drinks per day for men. Drinking more than that is harmful to your heart. If your heart has already been damaged by alcohol or you have severe HF, drinking alcohol should be stopped completely.   Exercising as directed by your caregiver.   Surgical treatment for HF may include:   Procedures to open blocked arteries, repair damaged heart valves, or remove damaged heart muscle tissue.   A pacemaker to help heart muscle function and to control certain abnormal heart rhythms.   A defibrillator to possibly prevent sudden cardiac death.  HOME CARE  INSTRUCTIONS   Activity level. Your caregiver can help you determine what type of exercise program may be helpful. It is important to maintain your strength. Pace your physical activity to avoid shortness of breath or chest pain. Rest for 1 hour before and after meals. A cardiac rehabilitation program may be helpful to some people with HF.   Diet. Eat a heart healthy diet. Food choices should be low in saturated fat and cholesterol. Talk to a dietician to learn about heart healthy foods.   Salt intake. When you have HF, you need to limit the amount of salt you eat. Eat less than 1500 milligrams (mg) of salt per day or as recommended by your caregiver.   Weight monitoring. Weigh yourself every day. You should weigh yourself in the morning after you urinate and before you eat breakfast. Wear the same amount of clothing each time you weigh yourself. Record your weight daily. Bring your recorded weights to your clinic visits. Tell your caregiver right away if   you have gained 3 lb/1.4 kg in 1 day, or 5 lb/2.3 kg in a week or whatever amount you were told to report.   Blood pressure monitoring. This should be done as directed by your caregiver. A home blood pressure cuff can be purchased at a drugstore. Record your blood pressure numbers and bring them to your clinic visits. Tell your caregiver if you become dizzy or lightheaded upon standing up.   Smoking. If you are currently a smoker, it is time to quit. Nicotine makes your heart work harder by causing your blood vessels to constrict. Do not use nicotine gum or patches before talking to your caregiver.   Follow up. Be sure to schedule a follow-up visit with your caregiver. Keep all your appointments.  SEEK MEDICAL CARE IF:   Your weight increases by 3 lb/1.4 kg in 1 day or 5 lb/2.3 kg in a week.   You notice increasing shortness of breath that is unusual for you. This may happen during rest, sleep, or with activity.   You cough more than normal,  especially with physical activity.   You notice more swelling in your hands, feet, ankles, or belly (abdomen).   You are unable to sleep because it is hard to breathe.   You cough up bloody mucus (sputum).   You begin to feel "jumping" or "fluttering" sensations (palpitations) in your chest.  SEEK IMMEDIATE MEDICAL CARE IF:   You have severe chest pain or pressure which may include symptoms such as:   Pain or pressure in the arms, neck, jaw, or back.   Feeling sweaty.   Feeling sick to your stomach (nauseous).   Feeling short of breath while at rest.   Having a fast or irregular heartbeat.   You experience stroke symptoms. These symptoms include:   Facial weakness or numbness.   Weakness or numbness in an arm, leg, or on one side of your body.   Blurred vision.   Difficulty talking or thinking.   Dizziness or fainting.   Severe headache.  THESE ARE MEDICAL EMERGENCIES. Do not wait to see if the symptoms go away. Call your local emergency services (911 in [place] DO NOT drive yourself to the hospital. IMPORTANT  Make a list of every medicine, vitamin, or herbal supplement you are taking. Keep the list with you at all times. Show it to your caregiver at every visit. Keep the list up-to-date.   Ask your caregiver or pharmacist to write an explanation of each medicine you are taking. This should include:   Why you are taking it.   The possible side effects.   The best time of day to take it.   Foods to take with it or what foods to avoid.   When to stop taking it.  MAKE SURE YOU:   Understand these instructions.   Will watch your condition.   Will get help right away if you are not doing well or get worse.  Document Released: 12/19/2004 Document Revised: 12/08/2010 Document Reviewed: 04/02/2009 [employer] Patient Information 2012 [employer].Heart Failure Heart failure (HF) is a condition in which the heart has trouble pumping blood. This means your heart  does not pump blood efficiently for your body to work well. In some cases of HF, fluid may back up into your lungs or you may have swelling (edema) in your lower legs. HF is a long-term (chronic) condition. It is important for you to take good care of yourself and follow your caregiver's treatment plan. CAUSES     Health conditions:   High blood pressure (hypertension) causes the heart muscle to work harder than normal. When pressure in the blood vessels is high, the heart needs to pump (contract) with more force in order to circulate blood throughout the body. High blood pressure eventually causes the heart to become stiff and weak.   Coronary artery disease (CAD) is the buildup of cholesterol and fat (plaques) in the arteries of the heart. The blockage in the arteries deprives the heart muscle of oxygen and blood. This can cause chest pain and may lead to a heart attack. High blood pressure can also contribute to CAD.   Heart attack (myocardial infarction) occurs when 1 or more arteries in the heart become blocked. The loss of oxygen damages the muscle tissue of the heart. When this happens, part of the heart muscle dies. The injured tissue does not contract as well and weakens the heart's ability to pump blood.   Abnormal heart valves can cause HF when the heart valves do not open and close properly. This makes the heart muscle pump harder to keep the blood flowing.   Heart muscle disease (cardiomyopathy or myocarditis) is damage to the heart muscle from a variety of causes. These can include drug or alcohol abuse, infections, or unknown reasons. These can increase the risk of HF.   Lung disease makes the heart work harder because the lungs do not work properly. This can cause a strain on the heart leading it to fail.   Diabetes increases the risk of HF. High blood sugar contributes to high fat (lipid) levels in the blood. Diabetes can also cause slow damage to tiny blood vessels that carry  important nutrients to the heart muscle. When the heart does not get enough oxygen and food, it can cause the heart to become weak and stiff. This leads to a heart that does not contract efficiently.   Other diseases can contribute to HF. These include abnormal heart rhythms, thyroid problems, and low blood counts (anemia).   Unhealthy lifestyle habits:   Obesity.   Smoking.   Eating foods high in fat and cholesterol.   Eating or drinking beverages high in salt.   Drug or alcohol abuse.   Lack of exercise.  SYMPTOMS  HF symptoms may vary and can be hard to detect. Symptoms may include:  Shortness of breath with activity, such as climbing stairs.   Persistent cough.   Swelling of the feet, ankles, legs, or abdomen.   Unexplained weight gain.   Difficulty breathing when lying flat.   Waking from sleep because of the need to sit up and get more air.   Rapid heartbeat.   Fatigue and loss of energy.   Feeling lightheaded or close to fainting.  DIAGNOSIS  A diagnosis of HF is based on your history, symptoms, physical examination, and diagnostic tests. Diagnostic tests for HF may include:  EKG.   Chest X-ray.   Blood tests.   Exercise stress test.   Blood oxygen test (arterial blood gas).   Evaluation by a heart doctor (cardiologist).   Ultrasound evaluation of the heart (echocardiogram).   Heart artery test to look for blockages (angiogram).   Radioactive imaging to look at the heart (radionuclide test).  TREATMENT  Treatment is aimed at managing the symptoms of HF. Medicines, lifestyle changes, or surgical intervention may be necessary to treat HF.  Medicines to help treat HF may include:   Angiotensin-converting enzyme (ACE) inhibitors. These block the effects of a blood   protein called angiotensin-converting enzyme. ACE inhibitors relax (dilate) the blood vessels and help lower blood pressure. This decreases the workload of the heart, slows the progression  of HF, and improves symptoms.   Angiotensin receptor blockers (ARBs). These medications work similar to ACE inhibitors. ARBs may be an alternative for people who cannot tolerate an ACE inhibitor.   Aldosterone antagonists. This medication helps get rid of extra fluid from your body. This lowers the volume of blood the heart has to pump.   Water pills (diuretics). Diuretics cause the kidneys to remove salt and water from the blood. The extra fluid is removed by urination. By removing extra fluid from the body, diuretics help lower the workload of the heart and help prevent fluid buildup in the lungs so breathing is easier.   Beta blockers. These prevent the heart from beating too fast and improve heart muscle strength. Beta blockers help maintain a normal heart rate, control blood pressure, and improve HF symptoms.   Digitalis. This increases the force of the heartbeat and may be helpful to people with HF or heart rhythm problems.   Healthy lifestyle changes include:   Stopping smoking.   Eating a healthy diet. Avoid foods high in fat. Avoid foods fried in oil or made with fat. A dietician can help with healthy food choices.   Limiting how much salt you eat.   Limiting alcohol intake to no more than 1 drink per day for women and 2 drinks per day for men. Drinking more than that is harmful to your heart. If your heart has already been damaged by alcohol or you have severe HF, drinking alcohol should be stopped completely.   Exercising as directed by your caregiver.   Surgical treatment for HF may include:   Procedures to open blocked arteries, repair damaged heart valves, or remove damaged heart muscle tissue.   A pacemaker to help heart muscle function and to control certain abnormal heart rhythms.   A defibrillator to possibly prevent sudden cardiac death.  HOME CARE INSTRUCTIONS   Activity level. Your caregiver can help you determine what type of exercise program may be helpful. It  is important to maintain your strength. Pace your physical activity to avoid shortness of breath or chest pain. Rest for 1 hour before and after meals. A cardiac rehabilitation program may be helpful to some people with HF.   Diet. Eat a heart healthy diet. Food choices should be low in saturated fat and cholesterol. Talk to a dietician to learn about heart healthy foods.   Salt intake. When you have HF, you need to limit the amount of salt you eat. Eat less than 1500 milligrams (mg) of salt per day or as recommended by your caregiver.   Weight monitoring. Weigh yourself every day. You should weigh yourself in the morning after you urinate and before you eat breakfast. Wear the same amount of clothing each time you weigh yourself. Record your weight daily. Bring your recorded weights to your clinic visits. Tell your caregiver right away if you have gained 3 lb/1.4 kg in 1 day, or 5 lb/2.3 kg in a week or whatever amount you were told to report.   Blood pressure monitoring. This should be done as directed by your caregiver. A home blood pressure cuff can be purchased at a drugstore. Record your blood pressure numbers and bring them to your clinic visits. Tell your caregiver if you become dizzy or lightheaded upon standing up.   Smoking. If you are   currently a smoker, it is time to quit. Nicotine makes your heart work harder by causing your blood vessels to constrict. Do not use nicotine gum or patches before talking to your caregiver.   Follow up. Be sure to schedule a follow-up visit with your caregiver. Keep all your appointments.  SEEK MEDICAL CARE IF:   Your weight increases by 3 lb/1.4 kg in 1 day or 5 lb/2.3 kg in a week.   You notice increasing shortness of breath that is unusual for you. This may happen during rest, sleep, or with activity.   You cough more than normal, especially with physical activity.   You notice more swelling in your hands, feet, ankles, or belly (abdomen).   You  are unable to sleep because it is hard to breathe.   You cough up bloody mucus (sputum).   You begin to feel "jumping" or "fluttering" sensations (palpitations) in your chest.  SEEK IMMEDIATE MEDICAL CARE IF:   You have severe chest pain or pressure which may include symptoms such as:   Pain or pressure in the arms, neck, jaw, or back.   Feeling sweaty.   Feeling sick to your stomach (nauseous).   Feeling short of breath while at rest.   Having a fast or irregular heartbeat.   You experience stroke symptoms. These symptoms include:   Facial weakness or numbness.   Weakness or numbness in an arm, leg, or on one side of your body.   Blurred vision.   Difficulty talking or thinking.   Dizziness or fainting.   Severe headache.  THESE ARE MEDICAL EMERGENCIES. Do not wait to see if the symptoms go away. Call your local emergency services (911 in [place] DO NOT drive yourself to the hospital. IMPORTANT  Make a list of every medicine, vitamin, or herbal supplement you are taking. Keep the list with you at all times. Show it to your caregiver at every visit. Keep the list up-to-date.   Ask your caregiver or pharmacist to write an explanation of each medicine you are taking. This should include:   Why you are taking it.   The possible side effects.   The best time of day to take it.   Foods to take with it or what foods to avoid.   When to stop taking it.  MAKE SURE YOU:   Understand these instructions.   Will watch your condition.   Will get help right away if you are not doing well or get worse.  Document Released: 12/19/2004 Document Revised: 12/08/2010 Document Reviewed: 04/02/2009 [employer] Patient Information 2012 [employer]. 

## 2011-04-28 NOTE — Discharge Summary (Signed)
  Discharge summary dictated on 04/28/2011 dictation number is [phone]

## 2011-05-13 NOTE — Discharge Summary (Signed)
[name], [name]             ACCOUNT NO.:  [id number]  MEDICAL RECORD NO.:  [id number]  LOCATION:  4711                         FACILITY:  [facility]  PHYSICIAN:  Sehaj Mcenroe N. [name], M.D. DATE OF BIRTH:  11-12-1950  DATE OF ADMISSION:  04/25/2011 DATE OF DISCHARGE:  04/28/2011                              DISCHARGE SUMMARY   ADDENDUM  We need to add in discharge home medications, Norco 5/325 mg tablet 1-2 tablets every 6 hours as needed for pain.     Eduardo Osier. [name], M.D.     [facility]  D:  05/12/2011  T:  05/12/2011  Job:  [id number]

## 2011-05-17 IMAGING — CR DG CHEST 1V PORT
1 series · 1 of 1 positions shown · non-contrast
Comparison: 09/18/2009

CLINICAL DATA: Chest pain.

PORTABLE CHEST - 1 VIEW

[view not recorded]
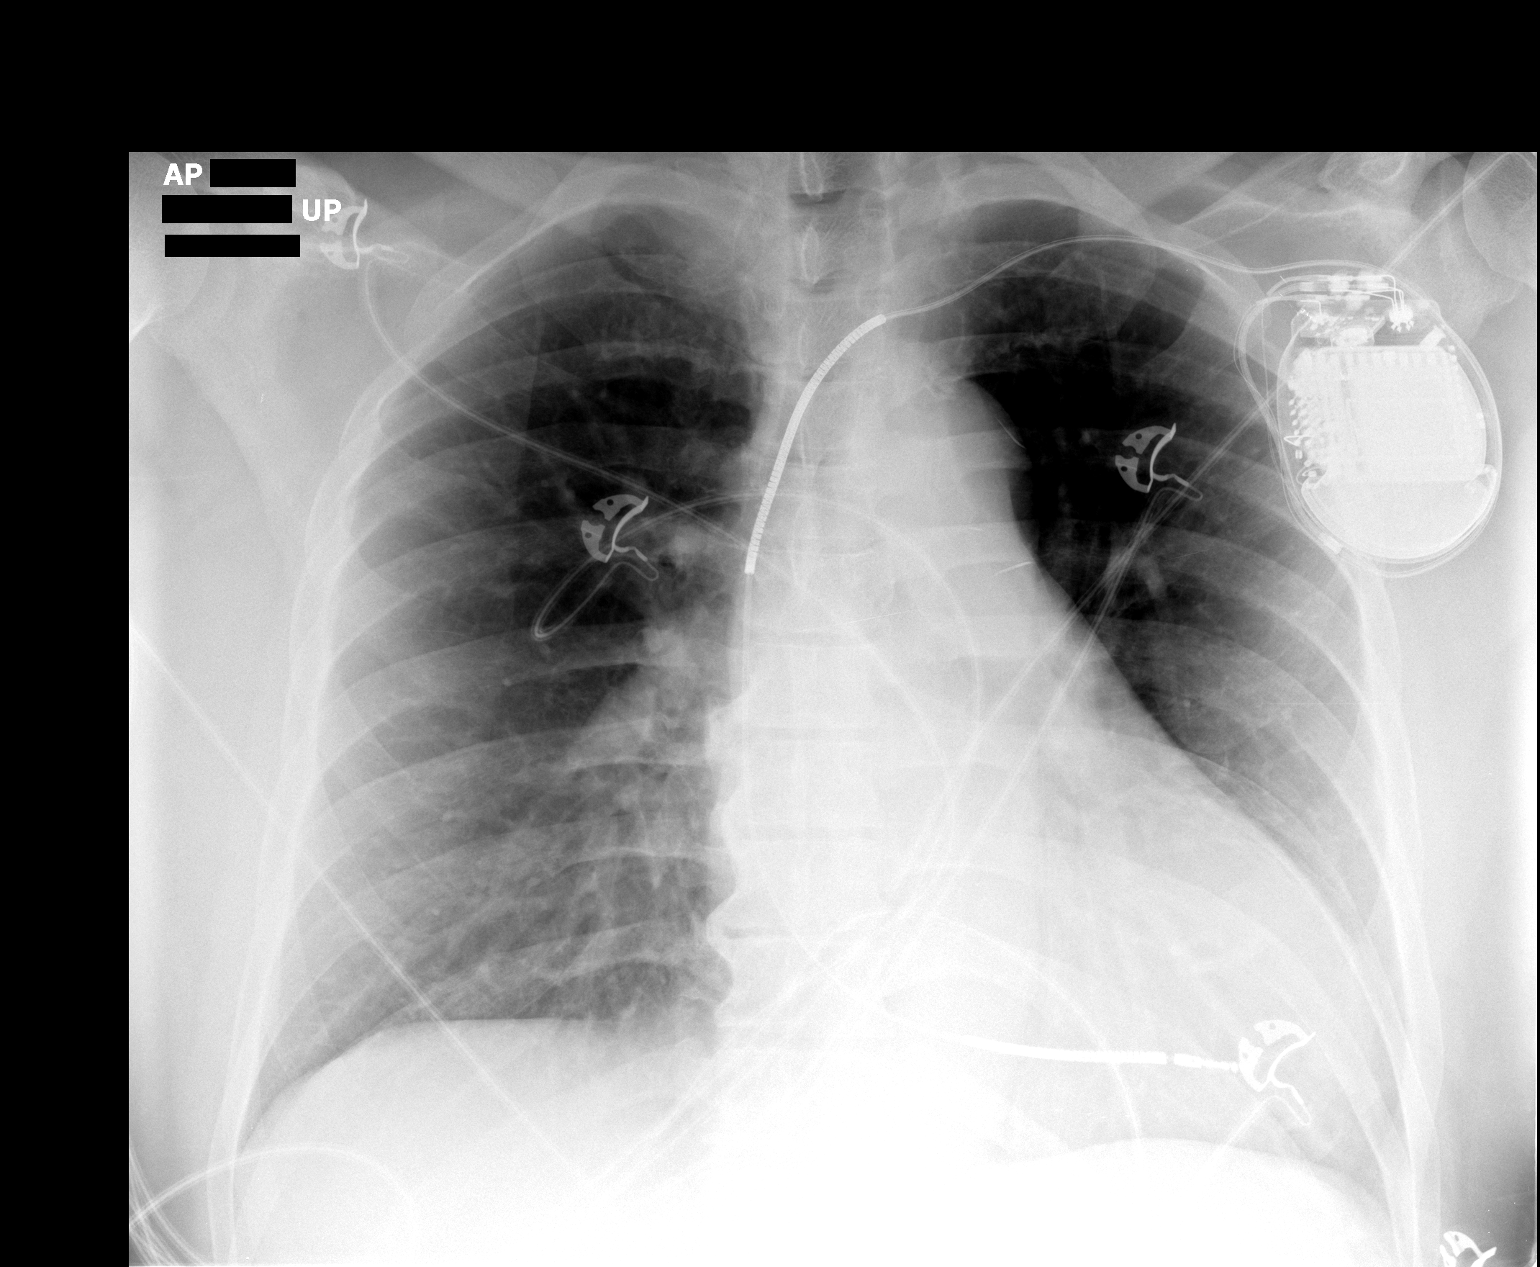

[1 of 1 positions shown; findings below may reference images not displayed]

FINDINGS: Transvenous defibrillator is in place.  The heart size
and vascularity are normal and the lungs are clear.  No acute
osseous abnormality.
IMPRESSION: No acute abnormalities.

## 2011-05-24 IMAGING — US US RENAL
1 series · 14 of 25 positions shown · non-contrast
Comparison: None.

CLINICAL DATA: Hypertension, left flank pain

RENAL/URINARY TRACT ULTRASOUND COMPLETE

[Series 1: us renal · 0.30mm/px · 14 of 47 slices shown]
[im 1/47]
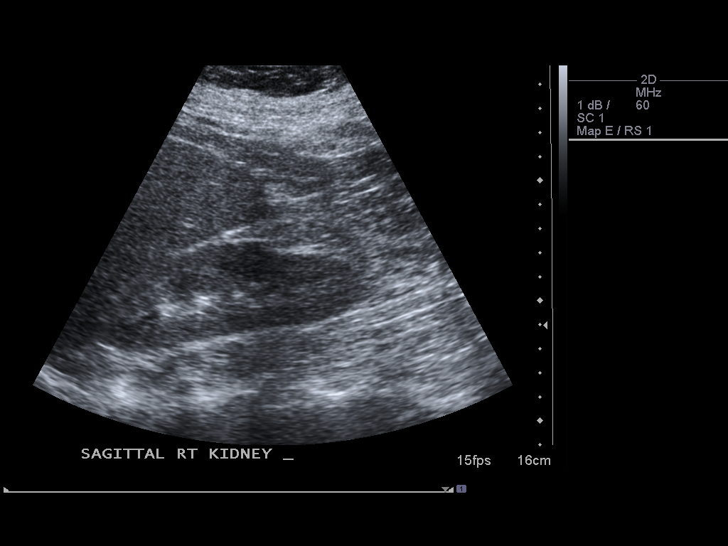
[im 4/47]
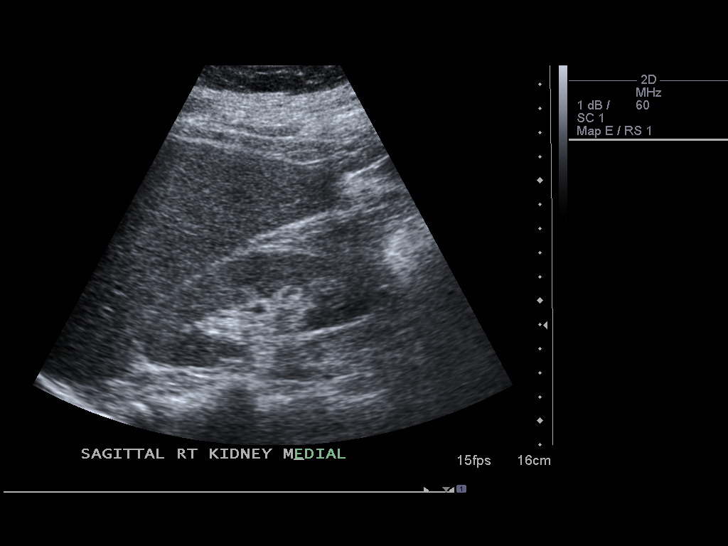
[im 8/47]
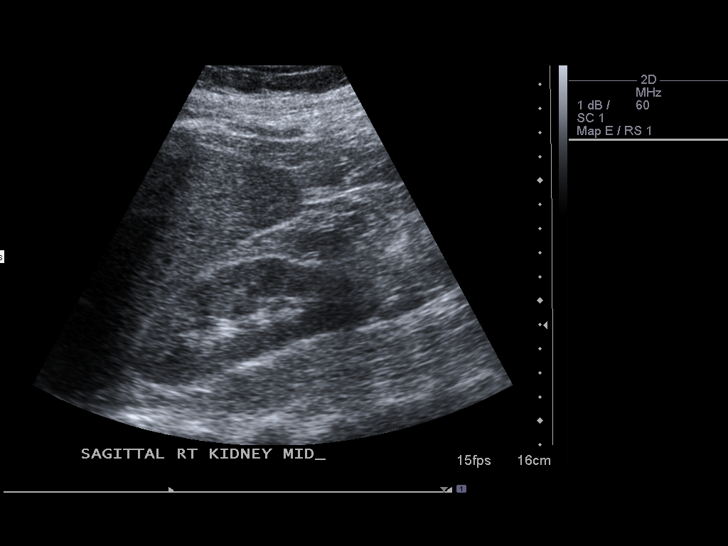
[im 12/47]
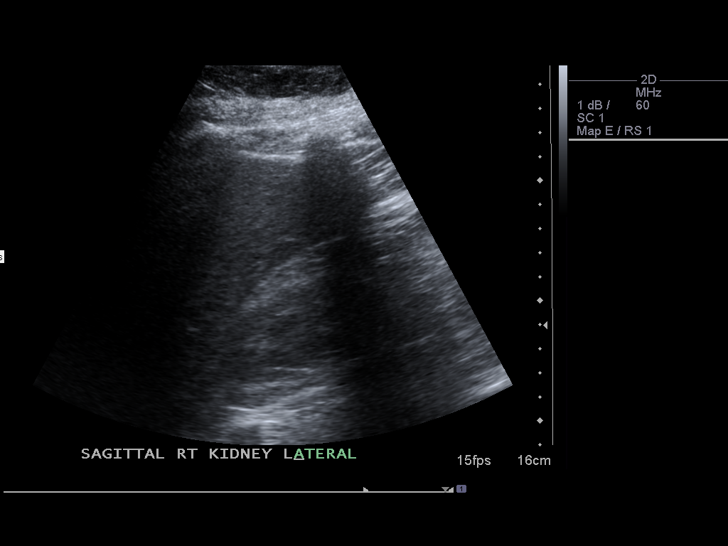
[im 16/47]
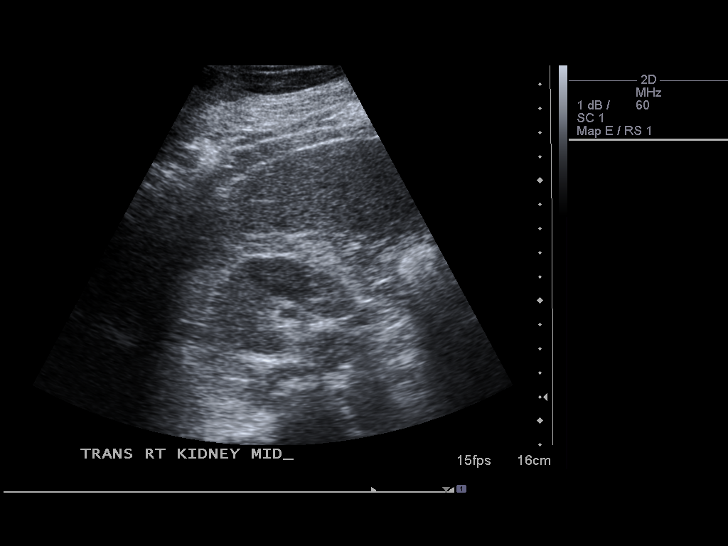
[im 18/47]
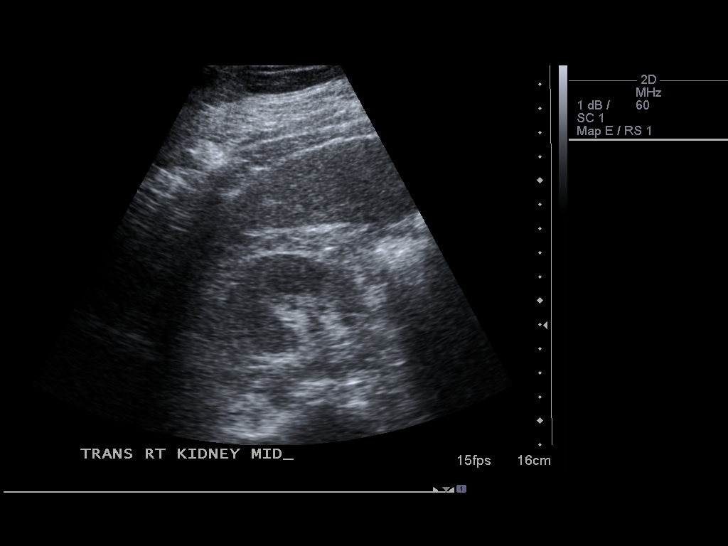
[im 22/47]
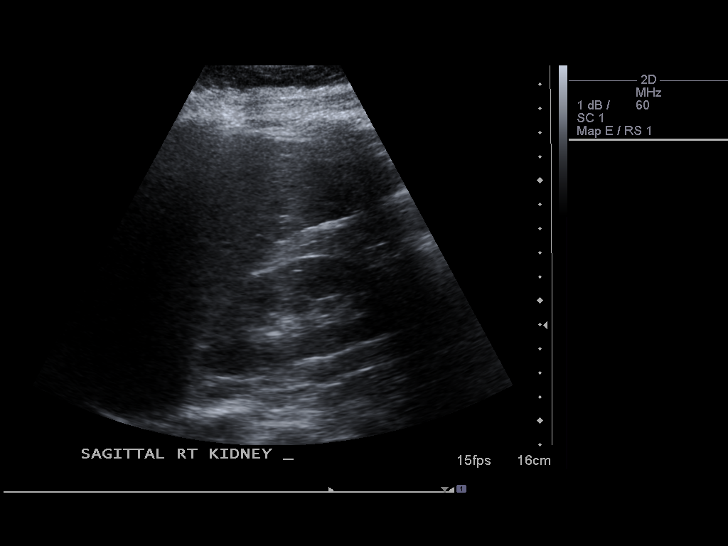
[im 25/47]
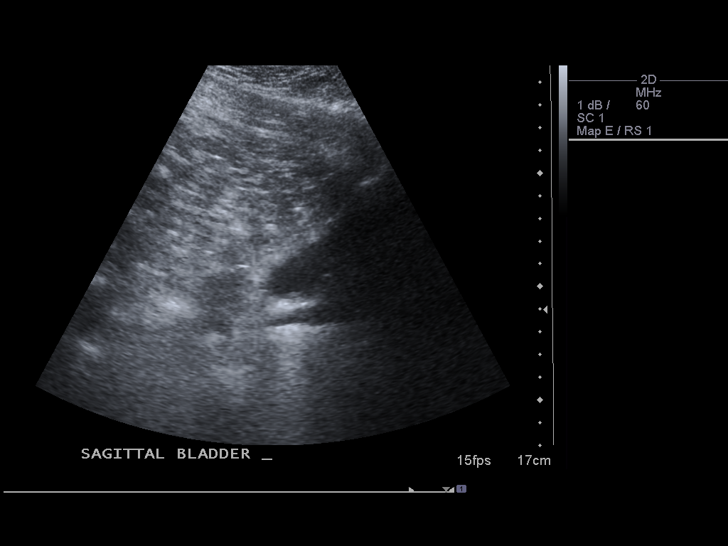
[im 29/47]
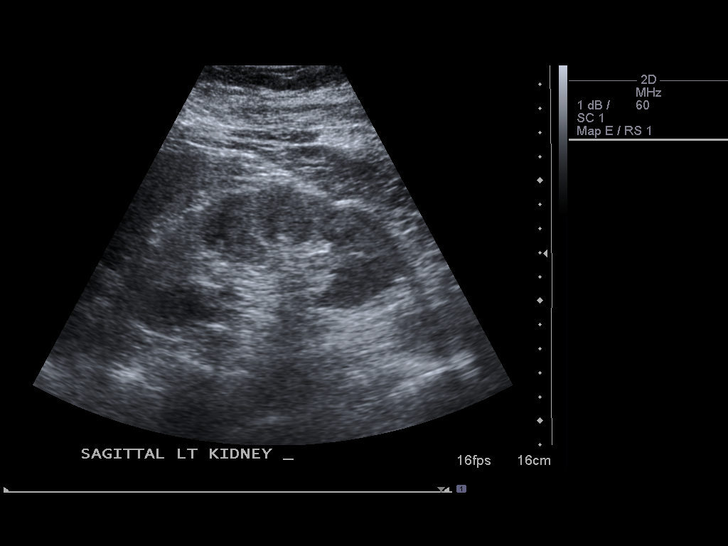
[im 31/47]
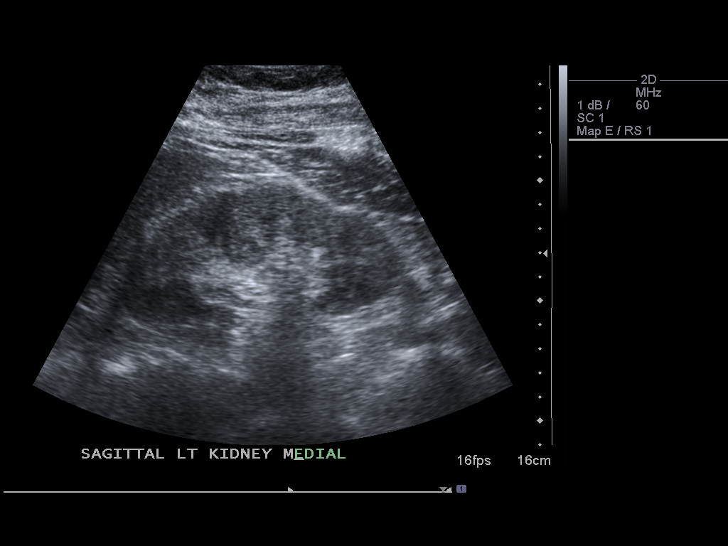
[im 35/47]
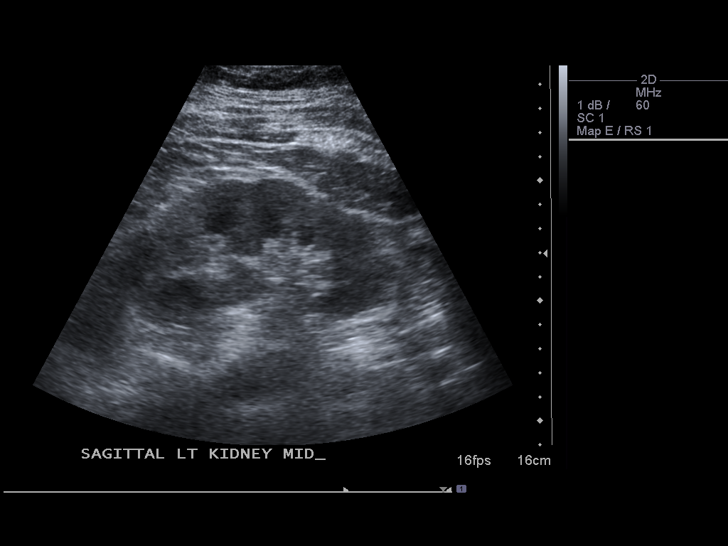
[im 39/47]
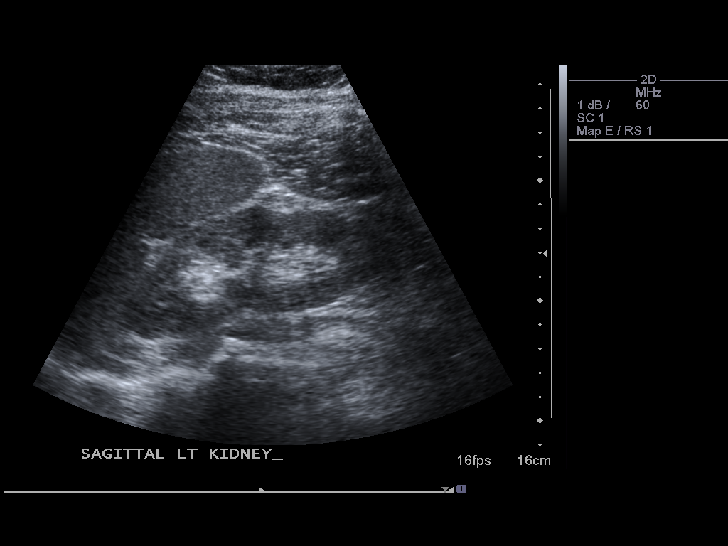
[im 43/47]
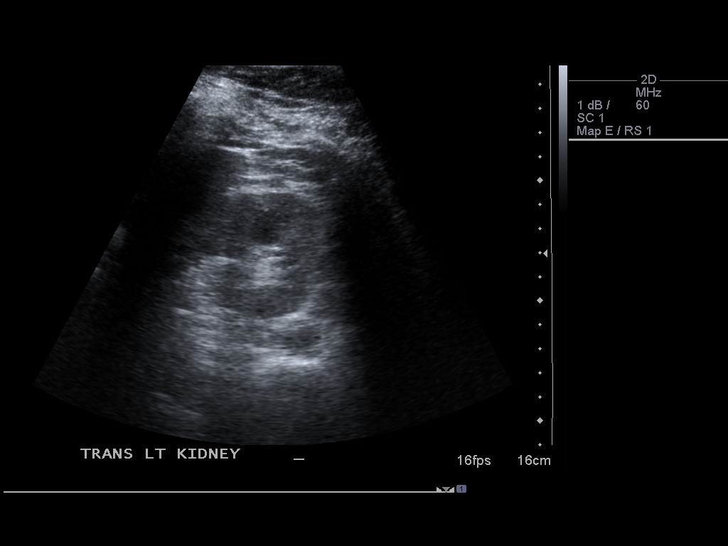
[im 47/47]
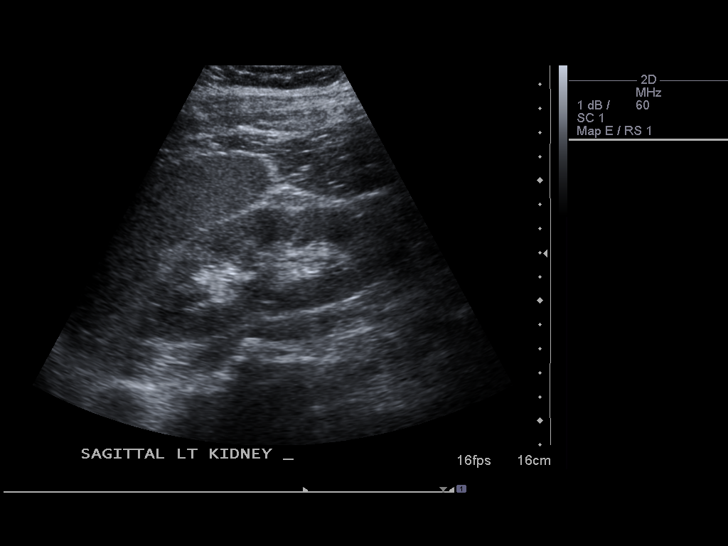

[14 of 25 positions shown; findings below may reference images not displayed]

FINDINGS: Right Kidney:  No hydronephrosis is seen.  The right kidney
measures 11.3 cm sagittally.

Left Kidney:  No hydronephrosis.  The left kidney measures 12.0 cm.

Bladder:  The urinary bladder is not well distended.
IMPRESSION: No hydronephrosis.

## 2011-06-15 ENCOUNTER — Ambulatory Visit (INDEPENDENT_AMBULATORY_CARE_PROVIDER_SITE_OTHER): Payer: PRIVATE HEALTH INSURANCE | Admitting: *Deleted

## 2011-06-15 DIAGNOSIS — I472 Ventricular tachycardia, unspecified: Secondary | ICD-10-CM

## 2011-06-15 DIAGNOSIS — I428 Other cardiomyopathies: Secondary | ICD-10-CM

## 2011-06-15 LAB — ICD DEVICE OBSERVATION
BATTERY VOLTAGE: 2.6621 V
BRDY-0002RV: 45 {beats}/min
DEVICE MODEL ICD: 521670
HV IMPEDENCE: 51 Ohm
PACEART VT: 0
RV LEAD THRESHOLD: 1.5 V
TOT-0009: 4
TOT-0010: 7
TZAT-0001SLOWVT: 1
TZAT-0012SLOWVT: 200 ms
TZAT-0019SLOWVT: 7.5 V
TZAT-0020SLOWVT: 1 ms
TZON-0004SLOWVT: 20
TZON-0005SLOWVT: 6
TZON-0010SLOWVT: 80 ms
TZST-0001SLOWVT: 2
TZST-0001SLOWVT: 4
TZST-0003SLOWVT: 20 J
TZST-0003SLOWVT: 36 J
TZST-0003SLOWVT: 36 J
VENTRICULAR PACING ICD: 6.3 pct
VF: 0

## 2011-06-15 NOTE — Progress Notes (Signed)
defib check in clinic  

## 2011-06-19 ENCOUNTER — Emergency Department (HOSPITAL_COMMUNITY)
Admission: EM | Admit: 2011-06-19 | Discharge: 2011-06-20 | Disposition: A | Discharge status: Home / Self Care | Payer: PRIVATE HEALTH INSURANCE | Attending: Emergency Medicine | Admitting: Emergency Medicine

## 2011-06-19 ENCOUNTER — Encounter (HOSPITAL_COMMUNITY): Payer: Self-pay | Admitting: Emergency Medicine

## 2011-06-19 ENCOUNTER — Emergency Department (HOSPITAL_COMMUNITY): Payer: PRIVATE HEALTH INSURANCE

## 2011-06-19 DIAGNOSIS — I4891 Unspecified atrial fibrillation: Secondary | ICD-10-CM | POA: Insufficient documentation

## 2011-06-19 DIAGNOSIS — Z79899 Other long term (current) drug therapy: Secondary | ICD-10-CM | POA: Insufficient documentation

## 2011-06-19 DIAGNOSIS — Z7901 Long term (current) use of anticoagulants: Secondary | ICD-10-CM | POA: Insufficient documentation

## 2011-06-19 DIAGNOSIS — I509 Heart failure, unspecified: Secondary | ICD-10-CM | POA: Insufficient documentation

## 2011-06-19 DIAGNOSIS — E7800 Pure hypercholesterolemia, unspecified: Secondary | ICD-10-CM | POA: Insufficient documentation

## 2011-06-19 DIAGNOSIS — I129 Hypertensive chronic kidney disease with stage 1 through stage 4 chronic kidney disease, or unspecified chronic kidney disease: Secondary | ICD-10-CM | POA: Insufficient documentation

## 2011-06-19 DIAGNOSIS — Z7982 Long term (current) use of aspirin: Secondary | ICD-10-CM | POA: Insufficient documentation

## 2011-06-19 DIAGNOSIS — I2510 Atherosclerotic heart disease of native coronary artery without angina pectoris: Secondary | ICD-10-CM | POA: Insufficient documentation

## 2011-06-19 DIAGNOSIS — N183 Chronic kidney disease, stage 3 unspecified: Secondary | ICD-10-CM | POA: Insufficient documentation

## 2011-06-19 DIAGNOSIS — Z950 Presence of cardiac pacemaker: Secondary | ICD-10-CM | POA: Insufficient documentation

## 2011-06-19 DIAGNOSIS — I5022 Chronic systolic (congestive) heart failure: Secondary | ICD-10-CM | POA: Insufficient documentation

## 2011-06-19 DIAGNOSIS — M62838 Other muscle spasm: Secondary | ICD-10-CM

## 2011-06-19 DIAGNOSIS — I252 Old myocardial infarction: Secondary | ICD-10-CM | POA: Insufficient documentation

## 2011-06-19 LAB — CBC
HCT: 43.9 % (ref 39.0–52.0)
Hemoglobin: 15.5 g/dL (ref 13.0–17.0)
MCH: 31.1 pg (ref 26.0–34.0)
MCV: 88 fL (ref 78.0–100.0)
Platelets: 207 10*3/uL (ref 150–400)
RBC: 4.99 MIL/uL (ref 4.22–5.81)
WBC: 6 10*3/uL (ref 4.0–10.5)

## 2011-06-19 LAB — BASIC METABOLIC PANEL
CO2: 27 mEq/L (ref 19–32)
Chloride: 98 mEq/L (ref 96–112)
Creatinine, Ser: 2.33 mg/dL — ABNORMAL HIGH (ref 0.50–1.35)
Glucose, Bld: 130 mg/dL — ABNORMAL HIGH (ref 70–99)

## 2011-06-19 LAB — TROPONIN I: Troponin I: <0.3 ng/mL (ref <0.30)

## 2011-06-19 MED ORDER — OXYCODONE-ACETAMINOPHEN 5-325 MG PO TABS: 2.0000 | ORAL_TABLET | Freq: Four times a day (QID) | ORAL | Status: AC | PRN

## 2011-06-19 MED ORDER — DIAZEPAM 5 MG PO TABS
5.0000 mg | ORAL_TABLET | Freq: Once | ORAL | Status: AC
  Administered 2011-06-19: 5 mg via ORAL
  Filled 2011-06-19: qty 1

## 2011-06-19 MED ORDER — OXYCODONE-ACETAMINOPHEN 5-325 MG PO TABS
2.0000 | ORAL_TABLET | Freq: Once | ORAL | Status: AC
  Administered 2011-06-19: 2 via ORAL
  Filled 2011-06-19: qty 2

## 2011-06-19 MED ORDER — DIAZEPAM 5 MG PO TABS: 5.0000 mg | ORAL_TABLET | Freq: Three times a day (TID) | ORAL | Status: AC | PRN

## 2011-06-19 NOTE — ED Notes (Addendum)
Pt reports chest, back, neck, arm pain since yesterday. He reports nothing makes it better or worse. No SOB or nausea or diaphoresis associated. Cardiac hx. Pt took aspirin today. He does not take nitro bc "it makes it feel like his head is going to burst wide open".  Pacemaker checked yesterday by cardiologist and he said no issues.

## 2011-06-19 NOTE — Discharge Instructions (Signed)
Muscle Spasm  Take medications as prescribed.  Warm moist heat, either from warm bath, hot water bottle, or heating pad to the spasm area will help with symptoms.  Expect to be sore for 7-10 days.  Follow up with your regular doctor.  If you do not have a doctor, follow up with one of the numbers listed.  Return to the ER for worsening pain, new weakness, numbness, loss of bowel or bladder control, or other concerning symptoms.  PSYCH ANXIOLYTICS BENZODIAZEPINES  PSYCH ANXIOLYTICS BENZODIAZEPINES: You have been prescribed a medication that belongs to a class called Benzodiazepines.      These medicines are used to treat anxiety (nervousness), tremors, seizures, vertigo, insomnia, nausea (especially that associated with chemotherapy), alcohol or sedative drug withdrawal, and muscle spasm; they may also be given (usually intravenously) in the ED for sedation during procedures. This medication is a "scheduled substance" that means it is against the law to share it or give it to anyone else.     You have been given a medication, or a prescription for a medication, that causes drowsiness or dizziness.  DO NOT drive a car, operate machinery, ride a bike, or perform jobs that require you to be alert until you know how you are going to react to this medicine.     Make sure your doctor knows if you have any of these conditions before you take this medication:  an alcohol or drug abuse problem, depression, glaucoma, kidney or liver disease, lung disease or breathing difficulties, myasthenia gravis, Parkinson's disease.     If you are on any of the following medications make sure that your doctor knows before you start this medication as they can cause some undesirable interactions:  Seizure medicines (used for convulsion or epilepsy), chloroquine, cimetidine (Tagamet), disulfiram (Antabuse), or erythromycin.     DO NOT drink alcoholic beverages while taking this medicine.     If you become dizzy, sit or  lie down at the first signs.  You should be careful going up and down stairs.     This medication may cause side-effects.  If they are bothersome, discontinue the medication.  If they are severe, follow-up with your physician or return to the Emergency Department for a recheck.  These side-effects include:  dizziness, depression, headaches, blurry vision, and problems sleeping. Tell your doctor if you are taking any of the following medicines:      DO NOT drink alcoholic beverages while taking this medicine.     If you become dizzy, sit or lie down at the first signs.  You should be careful going up and down stairs. DO NOT take more of this medicine than prescribed.  Taking too much of this medicine can cause DEATH.      If you miss a dose do not "double up."  DO NOT take extra doses as this will not help you feel any better any faster.  It may even cause unwanted side-effects.     Contact your doctor immediately if you develop an allergic reaction, you feel lightheaded, confused, drowsy, or experience thoughts of hurting yourself or others.  Call also if you experience yellowing of the eyes or skin, or abnormal muscle twitching or movements.     DO NOT take this medication if you are pregnant or are nursing or you are actively trying to become pregnant.     Keep this medication out of the reach of children.  Always keep this medication in child-proof containers.  DO  NOT give your medication to anyone else. This drug may be habit-forming (addictive).  DO NOT use it for more than one week without discussing it with your regular doctor.  You have been given a medication, or a prescription for a medication, that causes drowsiness or dizziness.  DO NOT drive a car, operate machinery, ride a bike, or perform jobs that require you to be alert until you know how you are going to react to this medicine.  THESE INSTRUCTIONS ARE NOT COMPREHENSIVE (complete):  Ask your pharmacist for additional  information and precautions for this medication.  PAIN ACETAMINOPHEN OXYCODONE  PAIN ACETAMINOPHEN OXYCODONE: You have been given a medication that contains acetaminophen and oxycodone.      This medication is used to relieve pain.     DO NOT take this medication if you have liver disease or drink alcohol on a daily basis.     DO NOT take this medication if you are taking other over-the-counter medications that contain Tylenol or acetaminophen (the active ingredient in Tylenol).     If you have side-effects that you think are caused by this medicine, tell your doctor.     DO NOT drink alcoholic beverages while taking this medicine.     If you become dizzy, sit or lie down at the first signs.  You should be careful going up and down stairs.     If you are pregnant or breastfeeding, notify your doctor before taking this medication.     Keep this medication out of the reach of children.  Always keep this medication in child-proof containers.  DO NOT give your medication to anyone else. This medication can be HABIT-FORMING.  Discontinue use when no longer needed and never give this medication to others.  You have been given a medication, or a prescription for a medication, that causes drowsiness or dizziness.  DO NOT drive a car, operate machinery, or perform jobs that require you to be alert until you know how you are going to react to this medicine.  THESE INSTRUCTIONS ARE NOT COMPREHENSIVE (complete):  Ask your pharmacist for additional information and precautions for this medication.   PAIN, GENERAL - WITH PAIN MEDICATION  PAIN, GENERAL: You have been seen today for treatment of your pain.  The physician who treated you did not feel that the cause of your pain was dangerous and felt it was OK to treat your symptoms.  You will be given a prescription for pain medication to treat your pain. Use the pain medication as needed for discomfort. It may be to your advantage to take the  pain medications regularly, around the clock as directed for the next a few days. By doing this, you can build up a helpful amount of the medicine in your system.  YOU SHOULD SEEK MEDICAL ATTENTION IMMEDIATELY, EITHER HERE OR AT THE NEAREST EMERGENCY DEPARTMENT, IF ANY OF THE FOLLOWING OCCURS:      Your pain becomes worse, despite treatment with pain medications.     You develop any other significant symptoms.  MUSCLE STRAIN, GENERAL  MUSCLE STRAIN, GENERAL: You have been diagnosed with a muscle strain.  Any muscle in the body can be strained. A strain is an injury to muscles where some of the muscle fibers are injured by being stretched or partially torn. This usually happens by overusing the muscle or performing an activity that the muscle is not used to doing.  Some of the symptoms of a strain include pain, muscle cramping, and  soreness to the touch.  Often, the pain and stiffness in the muscle is worse the next day. This is much like what happens when a person begins exercising for the first time. After the exercise session, the person may feel pretty good, however the next day all of the exercised muscles feel stiff and sore.  The general treatment for a strain includes the following:      Resting the affected part.     Pain medication.     Muscle relaxant medications.     Warm compresses (such as a warm, moist towel).     Gentle stretching of the injured muscle.     And when tolerated, gentle massage of the affected area. This injury is self-limited (it gets better on its own) and rarely requires specific treatment.  YOU SHOULD SEEK MEDICAL ATTENTION IMMEDIATELY, EITHER HERE OR AT THE NEAREST EMERGENCY DEPARTMENT, IF ANY OF THE FOLLOWING OCCURS:      Significant increase in swelling of the affected area.     Worsening pain instead of gradual improvement.     Redness of the skin over the affected area.     Inability to use the affected limb. Weakness or numbness of  the limb.

## 2011-06-19 NOTE — ED Provider Notes (Signed)
History     CSN: [id number]  Arrival date & time 06/19/11  1948   First MD Initiated Contact with Patient 06/19/11 2304      Chief Complaint  Patient presents with  . Chest Pain    (Consider location/radiation/quality/duration/timing/severity/associated sxs/prior treatment) HPI 61 year old male presents to emergency room complaining of right shoulder arm chest and back pain starting yesterday morning. Pain has been constant since onset. Pain is worse with movement of his shoulder and arm. Patient describes pain as sharp burning stinging. It feels like the pain he had with sciatica in the past. Patient denies any trauma. Patient went fishing on Saturday, do a lot of casting, also lifted a chair above his head. Pain came on gradually while sitting in church on Sunday. Patient denies any heaviness substernal pain, no nausea vomiting no sweating. Patient with significant history of CHF, coronary disease status post MI. Patient is not missed any doses of his medications.  Past Medical History  Diagnosis Date  . Hypertension   . CHF (congestive heart failure)   . Sarcoidosis   . Cardiomyopathy     non ischemic by cath  . Acute on chronic systolic heart failure   . Automatic implantable cardiac defibrillator in situ   . Atrial fibrillation   . NSVT (nonsustained ventricular tachycardia)   . GERD (gastroesophageal reflux disease)   . Hypercholesteremia   . Coronary artery disease   . Myocardial infarction   . Shortness of breath   . Chronic kidney disease (CKD), stage III (moderate)     Past Surgical History  Procedure Date  . Back surgery 1987    Ruptured disk repair  . Pacemaker insertion     with ICD  . Tee without cardioversion 01/17/2011    Procedure: TRANSESOPHAGEAL ECHOCARDIOGRAM (TEE);  Surgeon: Ricki Rodriguez, MD;  Location: [facility] ENDOSCOPY;  Service: Cardiovascular;  Laterality: N/A;  . Cardioversion 01/17/2011    Procedure: CARDIOVERSION;  Surgeon: Ricki Rodriguez, MD;   Location: [facility] ENDOSCOPY;  Service: Cardiovascular;  Laterality: N/A;    Family History  Problem Relation Age of Onset  . Heart disease    . Heart failure    . Stroke    . Anesthesia problems Neg Hx   . Hypotension Neg Hx   . Malignant hyperthermia Neg Hx   . Pseudochol deficiency Neg Hx     History  Substance Use Topics  . Smoking status: Never Smoker   . Smokeless tobacco: Not on file  . Alcohol Use: No      Review of Systems  All other systems reviewed and are negative.    Allergies  Review of patient's allergies indicates no known allergies.  Home Medications   Current Outpatient Rx  Name Route Sig Dispense Refill  . ALLOPURINOL 100 MG PO TABS Oral Take 1 tablet (100 mg total) by mouth daily. 30 tablet 3  . AMIODARONE HCL 200 MG PO TABS Oral Take 200 mg by mouth daily.    . ASPIRIN EC 81 MG PO TBEC Oral Take 81 mg by mouth every other day.     Marland Kitchen CARVEDILOL 6.25 MG PO TABS Oral Take 6.25 mg by mouth 2 (two) times daily with a meal.    . COLCHICINE 0.6 MG PO TABS Oral Take 0.6 mg by mouth daily.    Marland Kitchen DIGOXIN 0.25 MG PO TABS Oral Take 250 mcg by mouth daily.     . FUROSEMIDE 80 MG PO TABS Oral Take 80 mg by mouth 2 (  two) times daily.    Marland Kitchen METOLAZONE 5 MG PO TABS Oral Take 1 tablet (5 mg total) by mouth daily. 30 tablet 3  . PANTOPRAZOLE SODIUM 40 MG PO TBEC Oral Take 40 mg by mouth daily at 12 noon.    Marland Kitchen POTASSIUM CHLORIDE CRYS ER 20 MEQ PO TBCR Oral Take 20 mEq by mouth 2 (two) times daily.    Marland Kitchen ROSUVASTATIN CALCIUM 5 MG PO TABS Oral Take 5 mg by mouth at bedtime.     . SUCRALFATE 1 G PO TABS Oral Take 1 g by mouth 4 (four) times daily -  with meals and at bedtime.     . WARFARIN SODIUM 6 MG PO TABS Oral Take 9 mg by mouth at bedtime.      BP 110/88  Pulse 74  Temp 98 F (36.7 C) (Oral)  Resp 20  SpO2 98%  Physical Exam  Nursing note and vitals reviewed. Constitutional: He is oriented to person, place, and time. He appears well-developed and well-nourished.    HENT:  Head: Normocephalic and atraumatic.  Nose: Nose normal.  Mouth/Throat: Oropharynx is clear and moist.  Eyes: Conjunctivae and EOM are normal. Pupils are equal, round, and reactive to light.  Neck: Normal range of motion. Neck supple. No JVD present. No tracheal deviation present. No thyromegaly present.  Cardiovascular: Normal rate, regular rhythm, normal heart sounds and intact distal pulses.  Exam reveals no gallop and no friction rub.   No murmur heard. Pulmonary/Chest: Effort normal and breath sounds normal. No stridor. No respiratory distress. He has no wheezes. He has no rales. He exhibits no tenderness.  Abdominal: Soft. Bowel sounds are normal. He exhibits no distension and no mass. There is no tenderness. There is no rebound and no guarding.  Musculoskeletal: Normal range of motion. He exhibits tenderness (tenderness with palpation of musculature around the right scapular area, pain with movement of the shoulder reproducing the pain.). He exhibits no edema.  Lymphadenopathy:    He has no cervical adenopathy.  Neurological: He is oriented to person, place, and time. He has normal reflexes. No cranial nerve deficit. He exhibits normal muscle tone. Coordination normal.  Skin: Skin is dry. No rash noted. No erythema. No pallor.  Psychiatric: He has a normal mood and affect. His behavior is normal. Judgment and thought content normal.    ED Course  Procedures (including critical care time)  Labs Reviewed  BASIC METABOLIC PANEL - Abnormal; Notable for the following:    Glucose, Bld 130 (*)     BUN 46 (*)     Creatinine, Ser 2.33 (*)     GFR calc non Af Amer 29 (*)     GFR calc Af Amer 33 (*)     All other components within normal limits  CBC  TROPONIN I  POCT I-STAT TROPONIN I   Dg Chest 2 View  06/19/2011  *RADIOLOGY REPORT*  Clinical Data: Chest pain, shortness of breath.  CHEST - 2 VIEW  Comparison: 04/25/2011  Findings: Heart and mediastinal contours are within  normal limits. No focal opacities or effusions.  No acute bony abnormality. Left AICD remains in place, unchanged.  IMPRESSION: No active cardiopulmonary disease.  Original Report Authenticated By: Cyndie Chime, M.D.    Date: 06/19/2011  Rate: 82  Rhythm: atrial fibrillation  QRS Axis: left  Intervals: afib  ST/T Wave abnormalities: nonspecific ST/T changes  Conduction Disutrbances:none  Narrative Interpretation:   Old EKG Reviewed: none available and unchanged  1. Muscle spasm       MDM  61 year old male with right-sided pain appears to be muscle spasm. Will treat with Percocet and Valium. Do not feel this is ACS, PE, or other life-threatening event        Olivia Mackie, MD 06/19/11 2354

## 2011-06-20 NOTE — ED Notes (Signed)
Pt denies any questions and reports decrease pain upon discharge. 

## 2011-07-12 ENCOUNTER — Encounter: Payer: Self-pay | Admitting: Internal Medicine

## 2011-07-21 ENCOUNTER — Other Ambulatory Visit: Payer: Self-pay | Admitting: Orthopaedic Surgery

## 2011-07-21 DIAGNOSIS — M542 Cervicalgia: Secondary | ICD-10-CM

## 2011-07-24 ENCOUNTER — Ambulatory Visit
Admission: RE | Admit: 2011-07-24 | Discharge: 2011-07-24 | Disposition: A | Discharge status: Home / Self Care | Payer: PRIVATE HEALTH INSURANCE | Source: Ambulatory Visit | Attending: Orthopaedic Surgery | Admitting: Orthopaedic Surgery

## 2011-07-24 DIAGNOSIS — M542 Cervicalgia: Secondary | ICD-10-CM

## 2011-07-28 IMAGING — CR DG CHEST 1V PORT
1 series · 1 of 1 positions shown · non-contrast
Comparison: 12/03/2009

CLINICAL DATA: Chest pain

PORTABLE CHEST - 1 VIEW

[AP]
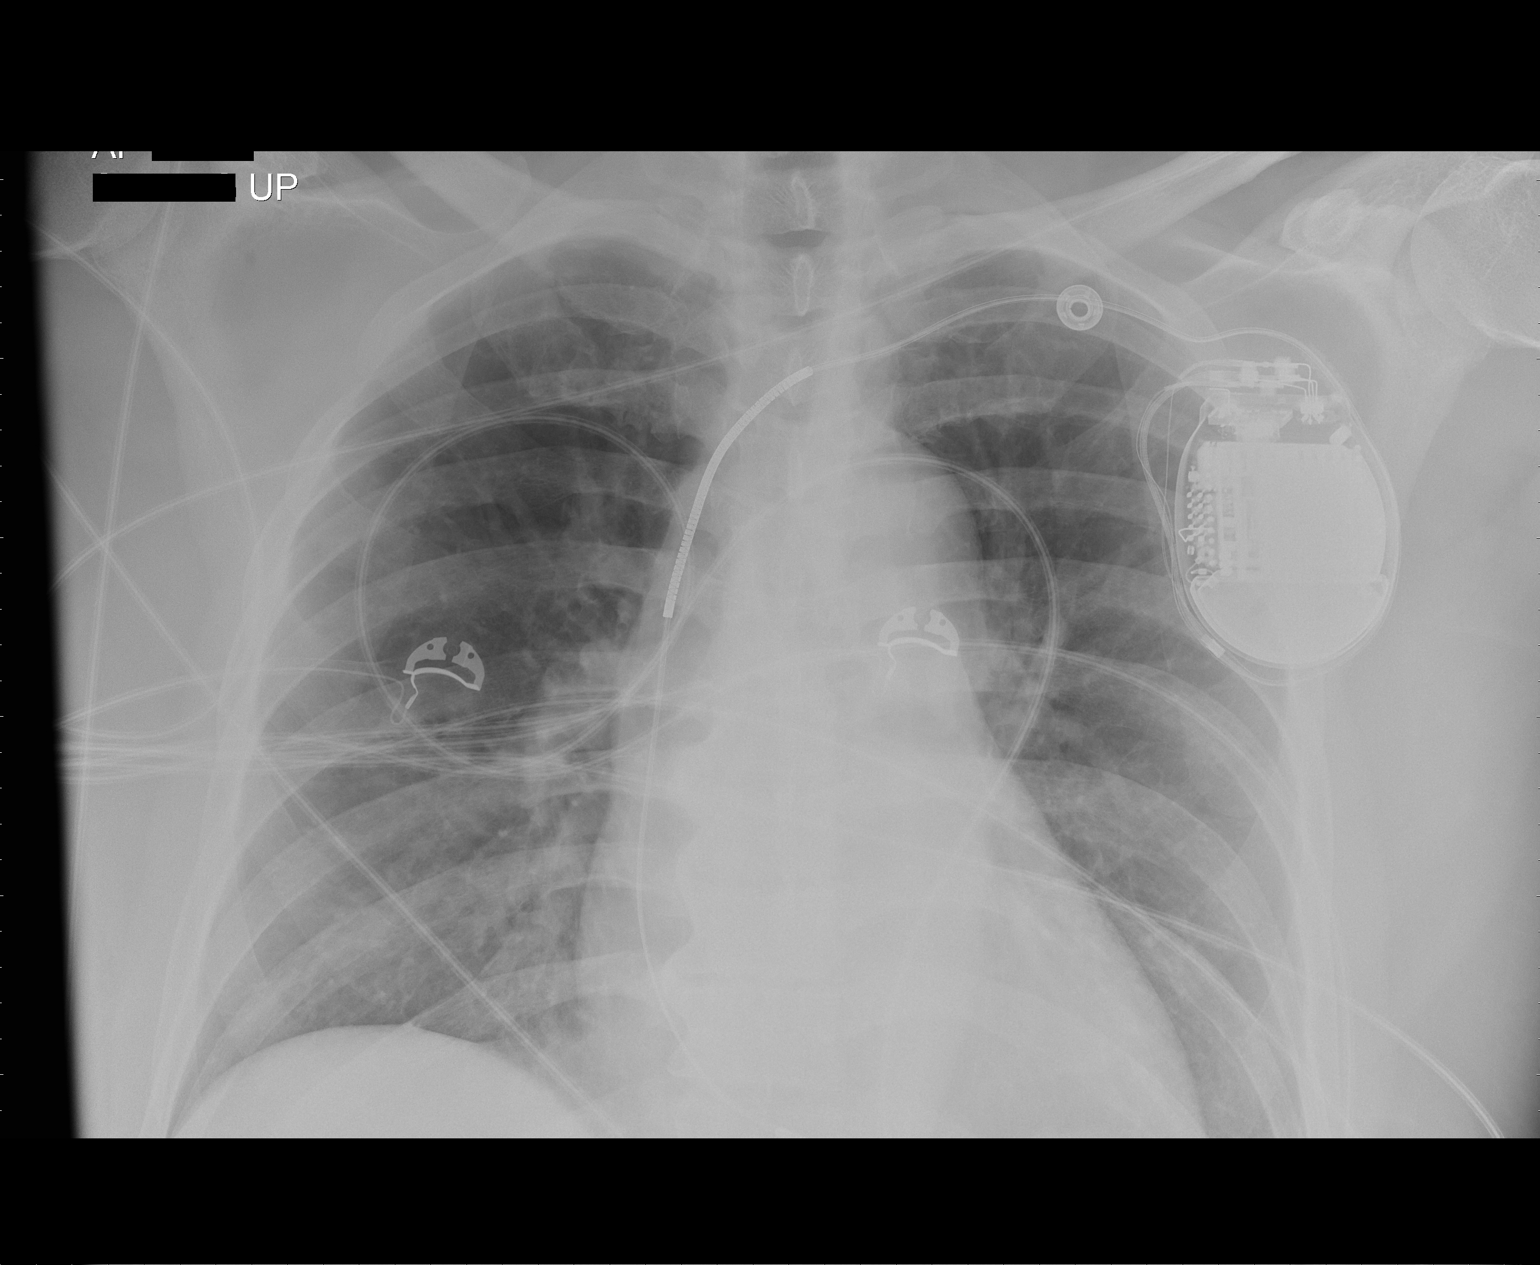

[1 of 1 positions shown; findings below may reference images not displayed]

FINDINGS: Left subclavian AICD stable.  Telemetry leads overlie the
chest.  Heart size stable compared to prior portable radiograph.
There is mild pulmonary vascular congestion without edema.
Negative for pneumothorax.  No visible pleural effusion.
IMPRESSION: Pulmonary vascular congestion without edema.

## 2011-07-31 ENCOUNTER — Emergency Department (HOSPITAL_COMMUNITY): Payer: PRIVATE HEALTH INSURANCE

## 2011-07-31 ENCOUNTER — Encounter (HOSPITAL_COMMUNITY): Payer: Self-pay | Admitting: *Deleted

## 2011-07-31 ENCOUNTER — Inpatient Hospital Stay (HOSPITAL_COMMUNITY)
Admission: EM | Admit: 2011-07-31 | Discharge: 2011-08-05 | DRG: 292 | Disposition: A | Discharge status: Home / Self Care | Payer: PRIVATE HEALTH INSURANCE | Attending: Cardiology | Admitting: Cardiology

## 2011-07-31 DIAGNOSIS — E739 Lactose intolerance, unspecified: Secondary | ICD-10-CM | POA: Diagnosis present

## 2011-07-31 DIAGNOSIS — I252 Old myocardial infarction: Secondary | ICD-10-CM

## 2011-07-31 DIAGNOSIS — N184 Chronic kidney disease, stage 4 (severe): Secondary | ICD-10-CM | POA: Diagnosis present

## 2011-07-31 DIAGNOSIS — Z79899 Other long term (current) drug therapy: Secondary | ICD-10-CM

## 2011-07-31 DIAGNOSIS — K219 Gastro-esophageal reflux disease without esophagitis: Secondary | ICD-10-CM | POA: Diagnosis present

## 2011-07-31 DIAGNOSIS — D869 Sarcoidosis, unspecified: Secondary | ICD-10-CM | POA: Diagnosis present

## 2011-07-31 DIAGNOSIS — I2510 Atherosclerotic heart disease of native coronary artery without angina pectoris: Secondary | ICD-10-CM | POA: Diagnosis present

## 2011-07-31 DIAGNOSIS — I5023 Acute on chronic systolic (congestive) heart failure: Principal | ICD-10-CM

## 2011-07-31 DIAGNOSIS — I509 Heart failure, unspecified: Secondary | ICD-10-CM | POA: Diagnosis present

## 2011-07-31 DIAGNOSIS — M109 Gout, unspecified: Secondary | ICD-10-CM | POA: Diagnosis present

## 2011-07-31 DIAGNOSIS — Z7982 Long term (current) use of aspirin: Secondary | ICD-10-CM

## 2011-07-31 DIAGNOSIS — Z9119 Patient's noncompliance with other medical treatment and regimen: Secondary | ICD-10-CM

## 2011-07-31 DIAGNOSIS — I428 Other cardiomyopathies: Secondary | ICD-10-CM | POA: Diagnosis present

## 2011-07-31 DIAGNOSIS — I4891 Unspecified atrial fibrillation: Secondary | ICD-10-CM | POA: Diagnosis present

## 2011-07-31 DIAGNOSIS — Z91199 Patient's noncompliance with other medical treatment and regimen due to unspecified reason: Secondary | ICD-10-CM

## 2011-07-31 DIAGNOSIS — Z7901 Long term (current) use of anticoagulants: Secondary | ICD-10-CM

## 2011-07-31 DIAGNOSIS — Z95810 Presence of automatic (implantable) cardiac defibrillator: Secondary | ICD-10-CM

## 2011-07-31 DIAGNOSIS — R0789 Other chest pain: Secondary | ICD-10-CM | POA: Diagnosis present

## 2011-07-31 DIAGNOSIS — I129 Hypertensive chronic kidney disease with stage 1 through stage 4 chronic kidney disease, or unspecified chronic kidney disease: Secondary | ICD-10-CM | POA: Diagnosis present

## 2011-07-31 DIAGNOSIS — N179 Acute kidney failure, unspecified: Secondary | ICD-10-CM | POA: Diagnosis present

## 2011-07-31 HISTORY — DX: Presence of cardiac pacemaker: Z95.0

## 2011-07-31 HISTORY — DX: Angina pectoris, unspecified: I20.9

## 2011-07-31 LAB — CBC WITH DIFFERENTIAL/PLATELET
Basophils Absolute: 0 10*3/uL (ref 0.0–0.1)
Eosinophils Absolute: 0.4 10*3/uL (ref 0.0–0.7)
Eosinophils Absolute: 0.5 10*3/uL (ref 0.0–0.7)
Eosinophils Relative: 8 % — ABNORMAL HIGH (ref 0–5)
Eosinophils Relative: 8 % — ABNORMAL HIGH (ref 0–5)
HCT: 38.4 % — ABNORMAL LOW (ref 39.0–52.0)
HCT: 44.2 % (ref 39.0–52.0)
Hemoglobin: 14.8 g/dL (ref 13.0–17.0)
Lymphocytes Relative: 45 % (ref 12–46)
Lymphs Abs: 2.1 10*3/uL (ref 0.7–4.0)
Lymphs Abs: 2.7 10*3/uL (ref 0.7–4.0)
MCH: 31 pg (ref 26.0–34.0)
MCH: 30.8 pg (ref 26.0–34.0)
MCV: 91.6 fL (ref 78.0–100.0)
MCV: 91.9 fL (ref 78.0–100.0)
Monocytes Absolute: 0.6 10*3/uL (ref 0.1–1.0)
Monocytes Absolute: 0.6 10*3/uL (ref 0.1–1.0)
Monocytes Relative: 9 % (ref 3–12)
Platelets: 212 10*3/uL (ref 150–400)
Platelets: 245 10*3/uL (ref 150–400)
RBC: 4.81 MIL/uL (ref 4.22–5.81)
RDW: 14.8 % (ref 11.5–15.5)

## 2011-07-31 LAB — COMPREHENSIVE METABOLIC PANEL
BUN: 24 mg/dL — ABNORMAL HIGH (ref 6–23)
CO2: 26 mEq/L (ref 19–32)
Calcium: 10.4 mg/dL (ref 8.4–10.5)
Creatinine, Ser: 1.76 mg/dL — ABNORMAL HIGH (ref 0.50–1.35)
GFR calc Af Amer: 46 mL/min — ABNORMAL LOW (ref >90)
GFR calc non Af Amer: 40 mL/min — ABNORMAL LOW (ref >90)
Glucose, Bld: 100 mg/dL — ABNORMAL HIGH (ref 70–99)
Total Protein: 8.7 g/dL — ABNORMAL HIGH (ref 6.0–8.3)

## 2011-07-31 LAB — PROTIME-INR
INR: 1.08 (ref 0.00–1.49)
Prothrombin Time: 14.6 seconds (ref 11.6–15.2)

## 2011-07-31 LAB — CARDIAC PANEL(CRET KIN+CKTOT+MB+TROPI)
Relative Index: 1.3 (ref 0.0–2.5)
Total CK: 877 U/L — ABNORMAL HIGH (ref 7–232)
Troponin I: <0.3 ng/mL (ref <0.30)

## 2011-07-31 LAB — BASIC METABOLIC PANEL
Calcium: 9.7 mg/dL (ref 8.4–10.5)
Creatinine, Ser: 1.74 mg/dL — ABNORMAL HIGH (ref 0.50–1.35)
GFR calc non Af Amer: 41 mL/min — ABNORMAL LOW (ref >90)
Glucose, Bld: 112 mg/dL — ABNORMAL HIGH (ref 70–99)
Sodium: 142 mEq/L (ref 135–145)

## 2011-07-31 LAB — MAGNESIUM: Magnesium: 1.9 mg/dL (ref 1.5–2.5)

## 2011-07-31 LAB — APTT: aPTT: 32 seconds (ref 24–37)

## 2011-07-31 MED ORDER — ONDANSETRON HCL 4 MG/2ML IJ SOLN
4.0000 mg | Freq: Four times a day (QID) | INTRAMUSCULAR | Status: DC | PRN
  Administered 2011-08-01 – 2011-08-04 (×3): 4 mg via INTRAVENOUS
  Filled 2011-07-31 (×3): qty 2

## 2011-07-31 MED ORDER — HEPARIN BOLUS VIA INFUSION
4000.0000 [IU] | Freq: Once | INTRAVENOUS | Status: AC
  Administered 2011-07-31: 4000 [IU] via INTRAVENOUS
  Filled 2011-07-31: qty 4000

## 2011-07-31 MED ORDER — METOLAZONE 5 MG PO TABS
5.0000 mg | ORAL_TABLET | ORAL | Status: DC
  Administered 2011-08-01 – 2011-08-05 (×5): 5 mg via ORAL
  Filled 2011-07-31 (×6): qty 1

## 2011-07-31 MED ORDER — ALLOPURINOL 100 MG PO TABS
100.0000 mg | ORAL_TABLET | Freq: Every day | ORAL | Status: DC
  Administered 2011-08-01 – 2011-08-05 (×4): 100 mg via ORAL
  Filled 2011-07-31 (×5): qty 1

## 2011-07-31 MED ORDER — WARFARIN - PHARMACIST DOSING INPATIENT: Freq: Every day | Status: DC

## 2011-07-31 MED ORDER — FUROSEMIDE 10 MG/ML IJ SOLN
80.0000 mg | Freq: Two times a day (BID) | INTRAMUSCULAR | Status: DC
  Administered 2011-08-01 – 2011-08-02 (×4): 80 mg via INTRAVENOUS
  Filled 2011-07-31 (×6): qty 8

## 2011-07-31 MED ORDER — SODIUM CHLORIDE 0.9 % IV SOLN: 250.0000 mL | INTRAVENOUS | Status: DC | PRN

## 2011-07-31 MED ORDER — WARFARIN SODIUM 6 MG PO TABS
12.0000 mg | ORAL_TABLET | Freq: Once | ORAL | Status: AC
  Administered 2011-08-01: 12 mg via ORAL
  Filled 2011-07-31 (×2): qty 2

## 2011-07-31 MED ORDER — COLCHICINE 0.6 MG PO TABS
0.6000 mg | ORAL_TABLET | Freq: Every day | ORAL | Status: DC
  Administered 2011-08-01 – 2011-08-05 (×4): 0.6 mg via ORAL
  Filled 2011-07-31 (×5): qty 1

## 2011-07-31 MED ORDER — SODIUM CHLORIDE 0.9 % IJ SOLN
3.0000 mL | INTRAMUSCULAR | Status: DC | PRN
  Administered 2011-08-04: 3 mL via INTRAVENOUS

## 2011-07-31 MED ORDER — AMIODARONE HCL 200 MG PO TABS
200.0000 mg | ORAL_TABLET | Freq: Every day | ORAL | Status: DC
  Administered 2011-08-01 – 2011-08-05 (×5): 200 mg via ORAL
  Filled 2011-07-31 (×5): qty 1

## 2011-07-31 MED ORDER — ATORVASTATIN CALCIUM 10 MG PO TABS
10.0000 mg | ORAL_TABLET | Freq: Every day | ORAL | Status: DC
  Administered 2011-08-01 – 2011-08-04 (×4): 10 mg via ORAL
  Filled 2011-07-31 (×5): qty 1

## 2011-07-31 MED ORDER — CARVEDILOL 6.25 MG PO TABS
6.2500 mg | ORAL_TABLET | Freq: Two times a day (BID) | ORAL | Status: DC
  Administered 2011-08-01 – 2011-08-03 (×5): 6.25 mg via ORAL
  Filled 2011-07-31 (×7): qty 1

## 2011-07-31 MED ORDER — DIGOXIN 250 MCG PO TABS
250.0000 ug | ORAL_TABLET | Freq: Every day | ORAL | Status: DC
  Administered 2011-08-01 – 2011-08-05 (×5): 250 ug via ORAL
  Filled 2011-07-31 (×5): qty 1

## 2011-07-31 MED ORDER — HEPARIN (PORCINE) IN NACL 100-0.45 UNIT/ML-% IJ SOLN
1400.0000 [IU]/h | INTRAMUSCULAR | Status: DC
  Administered 2011-07-31 – 2011-08-02 (×3): 1400 [IU]/h via INTRAVENOUS
  Filled 2011-07-31 (×7): qty 250

## 2011-07-31 MED ORDER — ISOSORB DINITRATE-HYDRALAZINE 20-37.5 MG PO TABS
1.0000 | ORAL_TABLET | Freq: Three times a day (TID) | ORAL | Status: DC
  Administered 2011-07-31 – 2011-08-05 (×11): 1 via ORAL
  Filled 2011-07-31 (×16): qty 1

## 2011-07-31 MED ORDER — ASPIRIN EC 81 MG PO TBEC
81.0000 mg | DELAYED_RELEASE_TABLET | ORAL | Status: DC
  Administered 2011-08-01 – 2011-08-05 (×3): 81 mg via ORAL
  Filled 2011-07-31 (×3): qty 1

## 2011-07-31 MED ORDER — MORPHINE SULFATE 2 MG/ML IJ SOLN
2.0000 mg | Freq: Four times a day (QID) | INTRAMUSCULAR | Status: DC | PRN
  Administered 2011-08-01 – 2011-08-05 (×6): 2 mg via INTRAVENOUS
  Filled 2011-07-31 (×6): qty 1

## 2011-07-31 MED ORDER — SUCRALFATE 1 G PO TABS
1.0000 g | ORAL_TABLET | Freq: Three times a day (TID) | ORAL | Status: DC
  Administered 2011-08-01 – 2011-08-05 (×6): 1 g via ORAL
  Filled 2011-07-31 (×21): qty 1

## 2011-07-31 MED ORDER — SODIUM CHLORIDE 0.9 % IJ SOLN
3.0000 mL | Freq: Two times a day (BID) | INTRAMUSCULAR | Status: DC
  Administered 2011-07-31 – 2011-08-05 (×5): 3 mL via INTRAVENOUS

## 2011-07-31 MED ORDER — ACETAMINOPHEN 325 MG PO TABS: 650.0000 mg | ORAL_TABLET | ORAL | Status: DC | PRN

## 2011-07-31 MED ORDER — FUROSEMIDE 10 MG/ML IJ SOLN
80.0000 mg | Freq: Once | INTRAMUSCULAR | Status: AC
  Administered 2011-07-31: 80 mg via INTRAVENOUS
  Filled 2011-07-31: qty 8

## 2011-07-31 MED ORDER — PANTOPRAZOLE SODIUM 40 MG PO TBEC
40.0000 mg | DELAYED_RELEASE_TABLET | Freq: Every day | ORAL | Status: DC
  Administered 2011-08-01 – 2011-08-05 (×4): 40 mg via ORAL
  Filled 2011-07-31 (×5): qty 1

## 2011-07-31 MED ORDER — POTASSIUM CHLORIDE CRYS ER 20 MEQ PO TBCR
20.0000 meq | EXTENDED_RELEASE_TABLET | Freq: Two times a day (BID) | ORAL | Status: DC
  Administered 2011-07-31 – 2011-08-05 (×10): 20 meq via ORAL
  Filled 2011-07-31 (×11): qty 1

## 2011-07-31 NOTE — ED Notes (Signed)
Pt here for chest pain radiaiting to ribs, afib with rvr in triage, pt sts pain increases with ambulation and movemnet, and sts pressure in his back, feels sob when he walkes. Hx of similar pain and sts it was CHF

## 2011-07-31 NOTE — ED Provider Notes (Signed)
I have seen and examined this patient with the resident.  I agree with the resident's note, assessment and plan except as indicated.    I seen the patient's EKG and agree with the resident's read.  Patient presents with worsening shortness of breath and decreased ability to complete his normal daily activities secondary to medication noncompliance.  Patient admits that he has not had his medications over the last week.  He does have a known history of CHF.  Patient will be given a dose of Lasix here.  We've contacted Dr. [name] the patient's cardiologist for admission for his CHF exacerbation.  Nat Christen, MD 07/31/11 2020

## 2011-07-31 NOTE — ED Provider Notes (Signed)
History     CSN: [id number]  Arrival date & time 07/31/11  1727   First MD Initiated Contact with Patient 07/31/11 1838      Chief Complaint  Patient presents with  . Chest Pain  . Shortness of Breath    (Consider location/radiation/quality/duration/timing/severity/associated sxs/prior treatment) Patient is a 61 y.o. male presenting with shortness of breath. The history is provided by the patient.  Shortness of Breath  The current episode started 2 days ago. The onset was gradual. The problem occurs frequently. The problem has been gradually worsening. The problem is moderate. The symptoms are relieved by rest. The symptoms are aggravated by activity. Associated symptoms include chest pressure, orthopnea, cough and shortness of breath. Pertinent negatives include no chest pain, no fever, no rhinorrhea, no sore throat, no stridor and no wheezing. Urine output has been normal. There were no sick contacts. He has received no recent medical care.    Past Medical History  Diagnosis Date  . Hypertension   . CHF (congestive heart failure)   . Sarcoidosis   . Cardiomyopathy     non ischemic by cath  . Acute on chronic systolic heart failure   . Automatic implantable cardiac defibrillator in situ   . Atrial fibrillation   . NSVT (nonsustained ventricular tachycardia)   . GERD (gastroesophageal reflux disease)   . Hypercholesteremia   . Coronary artery disease   . Myocardial infarction   . Shortness of breath   . Chronic kidney disease (CKD), stage III (moderate)   . Pacemaker   . Anginal pain     Past Surgical History  Procedure Date  . Back surgery 1987    Ruptured disk repair  . Pacemaker insertion     with ICD  . Tee without cardioversion 01/17/2011    Procedure: TRANSESOPHAGEAL ECHOCARDIOGRAM (TEE);  Surgeon: Ricki Rodriguez, MD;  Location: [facility] ENDOSCOPY;  Service: Cardiovascular;  Laterality: N/A;  . Cardioversion 01/17/2011    Procedure: CARDIOVERSION;  Surgeon: Ricki Rodriguez, MD;  Location: [facility] ENDOSCOPY;  Service: Cardiovascular;  Laterality: N/A;  . Cardiac catheterization   . Insert / replace / remove pacemaker     Family History  Problem Relation Age of Onset  . Heart disease    . Heart failure    . Stroke    . Anesthesia problems Neg Hx   . Hypotension Neg Hx   . Malignant hyperthermia Neg Hx   . Pseudochol deficiency Neg Hx     History  Substance Use Topics  . Smoking status: Never Smoker   . Smokeless tobacco: Not on file  . Alcohol Use: No      Review of Systems  Constitutional: Negative for fever, activity change, appetite change and fatigue.  HENT: Negative for congestion, sore throat, facial swelling, rhinorrhea, trouble swallowing, neck pain, neck stiffness, voice change and sinus pressure.   Eyes: Negative.   Respiratory: Positive for cough and shortness of breath. Negative for choking, chest tightness, wheezing and stridor.   Cardiovascular: Positive for orthopnea. Negative for chest pain.  Gastrointestinal: Negative for nausea, vomiting and abdominal pain.  Genitourinary: Negative for dysuria, urgency, frequency, hematuria, flank pain and difficulty urinating.  Musculoskeletal: Negative for back pain and gait problem.  Skin: Negative for rash and wound.  Neurological: Negative for facial asymmetry, weakness, numbness and headaches.  Psychiatric/Behavioral: Negative for behavioral problems, confusion and agitation. The patient is not nervous/anxious and is not hyperactive.   All other systems reviewed and are negative.  Allergies  Review of patient's allergies indicates no known allergies.  Home Medications   No current outpatient prescriptions on file.  BP 128/90  Pulse 84  Temp 98.5 F (36.9 C) (Oral)  Resp 22  Ht 6\' 3"  (1.905 m)  Wt 230 lb (104.327 kg)  BMI 28.75 kg/m2  SpO2 100%  Physical Exam  Nursing note and vitals reviewed. Constitutional: He is oriented to person, place, and time. He appears  well-developed and well-nourished. No distress.  HENT:  Head: Normocephalic and atraumatic.  Right Ear: External ear normal.  Left Ear: External ear normal.  Mouth/Throat: No oropharyngeal exudate.  Eyes: Conjunctivae and EOM are normal. Pupils are equal, round, and reactive to light. Right eye exhibits no discharge. Left eye exhibits no discharge.  Neck: Normal range of motion. Neck supple. JVD present. No tracheal deviation present. No thyromegaly present.  Cardiovascular: Normal rate, regular rhythm, normal heart sounds and intact distal pulses.  Exam reveals no gallop and no friction rub.   No murmur heard. Pulmonary/Chest: Effort normal. No respiratory distress. He has no wheezes. He has rales (bilateral basilar rales). He exhibits no tenderness.  Abdominal: Soft. Bowel sounds are normal. He exhibits no distension. There is no tenderness. There is no rebound and no guarding.  Musculoskeletal: Normal range of motion. He exhibits edema (1+ pitting edema around the ankles). He exhibits no tenderness.  Lymphadenopathy:    He has no cervical adenopathy.  Neurological: He is alert and oriented to person, place, and time. No cranial nerve deficit.  Skin: Skin is warm and dry. No rash noted. He is not diaphoretic. No pallor.  Psychiatric: He has a normal mood and affect. His behavior is normal.    ED Course  Procedures (including critical care time)  Labs Reviewed  CBC WITH DIFFERENTIAL - Abnormal; Notable for the following:    RBC 4.19 (*)     HCT 38.4 (*)     Neutrophils Relative 42 (*)     Eosinophils Relative 8 (*)     All other components within normal limits  BASIC METABOLIC PANEL - Abnormal; Notable for the following:    Glucose, Bld 112 (*)     BUN 26 (*)     Creatinine, Ser 1.74 (*)     GFR calc non Af Amer 41 (*)     GFR calc Af Amer 47 (*)     All other components within normal limits  PRO B NATRIURETIC PEPTIDE - Abnormal; Notable for the following:    Pro B Natriuretic  peptide (BNP) 7983.0 (*)     All other components within normal limits  CBC WITH DIFFERENTIAL - Abnormal; Notable for the following:    Neutrophils Relative 38 (*)     Eosinophils Relative 8 (*)     All other components within normal limits  COMPREHENSIVE METABOLIC PANEL - Abnormal; Notable for the following:    Glucose, Bld 100 (*)     BUN 24 (*)     Creatinine, Ser 1.76 (*)     Total Protein 8.7 (*)     AST 83 (*)     ALT 63 (*)     GFR calc non Af Amer 40 (*)     GFR calc Af Amer 46 (*)     All other components within normal limits  PRO B NATRIURETIC PEPTIDE - Abnormal; Notable for the following:    Pro B Natriuretic peptide (BNP) 10112.0 (*)     All other components within normal limits  DIGOXIN LEVEL - Abnormal; Notable for the following:    Digoxin Level 0.5 (*)     All other components within normal limits  CARDIAC PANEL(CRET KIN+CKTOT+MB+TROPI) - Abnormal; Notable for the following:    Total CK 877 (*)     CK, MB 11.1 (*)     All other components within normal limits  TROPONIN I  PROTIME-INR  APTT  PROTIME-INR  MAGNESIUM  PROTIME-INR  CBC  HEPARIN LEVEL (UNFRACTIONATED)  TSH  CARDIAC PANEL(CRET KIN+CKTOT+MB+TROPI)  BASIC METABOLIC PANEL  CARDIAC PANEL(CRET KIN+CKTOT+MB+TROPI)   Dg Chest 2 View  07/31/2011  *RADIOLOGY REPORT*  Clinical Data: Chest pain, shortness of breath  CHEST - 2 VIEW  Comparison: 06/19/2011  Findings: Cardiomegaly is noted.  No acute infiltrate or pulmonary edema.  A single lead cardiac pacemaker is unchanged in position. There is linear atelectasis left base.  IMPRESSION: Cardiomegaly.  No acute infiltrate or pulmonary edema.  Linear atelectasis left base.  Original Report Authenticated By: Natasha Mead, M.D.     No diagnosis found.    MDM  61 year old male patient with past medical history of congestive heart failure cardiomyopathy coronary artery disease presents with progressing shortness of breath and dyspnea on exertion. Patient  complains of orthopnea cough chest pressure and shortness of breath has been progressively worsening over one week. Patient says she's been noncompliant with his medications for the past week as he lost them by accident. Patient without fevers nausea vomiting chest pain, pain or rashes. Patient with exam findings above consistent with heart failure. Patient says he is unable to walk around his house and do his daily activities because of his dyspnea. Patient says this is similar to his previous heart failure exacerbations. No evidence of ACS as EKG is unchanged chest x-ray is normal troponin is negative. Patient with no evidence of pneumonia pulmonary embolism pneumothorax. We'll give patient 80 mg Lasix and he'll likely need admitting to the hospital because of his dyspnea on exertion with congestive heart failure exacerbation  Results for orders placed during the hospital encounter of 07/31/11  TROPONIN I      Component Value Range   Troponin I <0.30  <0.30 ng/mL  CBC WITH DIFFERENTIAL      Component Value Range   WBC 5.4  4.0 - 10.5 K/uL   RBC 4.19 (*) 4.22 - 5.81 MIL/uL   Hemoglobin 13.0  13.0 - 17.0 g/dL   HCT 21.3 (*) 08.6 - 57.8 %   MCV 91.6  78.0 - 100.0 fL   MCH 31.0  26.0 - 34.0 pg   MCHC 33.9  30.0 - 36.0 g/dL   RDW 46.9  62.9 - 52.8 %   Platelets 212  150 - 400 K/uL   Neutrophils Relative 42 (*) 43 - 77 %   Neutro Abs 2.3  1.7 - 7.7 K/uL   Lymphocytes Relative 38  12 - 46 %   Lymphs Abs 2.1  0.7 - 4.0 K/uL   Monocytes Relative 11  3 - 12 %   Monocytes Absolute 0.6  0.1 - 1.0 K/uL   Eosinophils Relative 8 (*) 0 - 5 %   Eosinophils Absolute 0.4  0.0 - 0.7 K/uL   Basophils Relative 1  0 - 1 %   Basophils Absolute 0.0  0.0 - 0.1 K/uL  BASIC METABOLIC PANEL      Component Value Range   Sodium 142  135 - 145 mEq/L   Potassium 4.1  3.5 - 5.1 mEq/L   Chloride 111  96 - 112 mEq/L   CO2 19  19 - 32 mEq/L   Glucose, Bld 112 (*) 70 - 99 mg/dL   BUN 26 (*) 6 - 23 mg/dL   Creatinine,  Ser 2.95 (*) 0.50 - 1.35 mg/dL   Calcium 9.7  8.4 - 62.1 mg/dL   GFR calc non Af Amer 41 (*) >90 mL/min   GFR calc Af Amer 47 (*) >90 mL/min  PRO B NATRIURETIC PEPTIDE      Component Value Range   Pro B Natriuretic peptide (BNP) 7983.0 (*) 0 - 125 pg/mL  PROTIME-INR      Component Value Range   Prothrombin Time 14.6  11.6 - 15.2 seconds   INR 1.12  0.00 - 1.49  CBC WITH DIFFERENTIAL      Component Value Range   WBC 6.0  4.0 - 10.5 K/uL   RBC 4.81  4.22 - 5.81 MIL/uL   Hemoglobin 14.8  13.0 - 17.0 g/dL   HCT 30.8  65.7 - 84.6 %   MCV 91.9  78.0 - 100.0 fL   MCH 30.8  26.0 - 34.0 pg   MCHC 33.5  30.0 - 36.0 g/dL   RDW 96.2  95.2 - 84.1 %   Platelets 245  150 - 400 K/uL   Neutrophils Relative 38 (*) 43 - 77 %   Neutro Abs 2.3  1.7 - 7.7 K/uL   Lymphocytes Relative 45  12 - 46 %   Lymphs Abs 2.7  0.7 - 4.0 K/uL   Monocytes Relative 9  3 - 12 %   Monocytes Absolute 0.6  0.1 - 1.0 K/uL   Eosinophils Relative 8 (*) 0 - 5 %   Eosinophils Absolute 0.5  0.0 - 0.7 K/uL   Basophils Relative 0  0 - 1 %   Basophils Absolute 0.0  0.0 - 0.1 K/uL  COMPREHENSIVE METABOLIC PANEL      Component Value Range   Sodium 140  135 - 145 mEq/L   Potassium 3.6  3.5 - 5.1 mEq/L   Chloride 104  96 - 112 mEq/L   CO2 26  19 - 32 mEq/L   Glucose, Bld 100 (*) 70 - 99 mg/dL   BUN 24 (*) 6 - 23 mg/dL   Creatinine, Ser 3.24 (*) 0.50 - 1.35 mg/dL   Calcium 40.1  8.4 - 02.7 mg/dL   Total Protein 8.7 (*) 6.0 - 8.3 g/dL   Albumin 3.5  3.5 - 5.2 g/dL   AST 83 (*) 0 - 37 U/L   ALT 63 (*) 0 - 53 U/L   Alkaline Phosphatase 67  39 - 117 U/L   Total Bilirubin 1.0  0.3 - 1.2 mg/dL   GFR calc non Af Amer 40 (*) >90 mL/min   GFR calc Af Amer 46 (*) >90 mL/min  APTT      Component Value Range   aPTT 32  24 - 37 seconds  PROTIME-INR      Component Value Range   Prothrombin Time 14.2  11.6 - 15.2 seconds   INR 1.08  0.00 - 1.49  MAGNESIUM      Component Value Range   Magnesium 1.9  1.5 - 2.5 mg/dL  PRO B  NATRIURETIC PEPTIDE      Component Value Range   Pro B Natriuretic peptide (BNP) 10112.0 (*) 0 - 125 pg/mL  DIGOXIN LEVEL      Component Value Range   Digoxin Level 0.5 (*) 0.8 - 2.0 ng/mL  CARDIAC  PANEL(CRET KIN+CKTOT+MB+TROPI)      Component Value Range   Total CK 877 (*) 7 - 232 U/L   CK, MB 11.1 (*) 0.3 - 4.0 ng/mL   Troponin I <0.30  <0.30 ng/mL   Relative Index 1.3  0.0 - 2.5   DG Chest 2 View (Final result)   Result time:07/31/11 1826    Final result by Rad Results In Interface (07/31/11 18:26:38)    Narrative:   *RADIOLOGY REPORT*  Clinical Data: Chest pain, shortness of breath  CHEST - 2 VIEW  Comparison: 06/19/2011  Findings: Cardiomegaly is noted. No acute infiltrate or pulmonary edema. A single lead cardiac pacemaker is unchanged in position. There is linear atelectasis left base.  IMPRESSION: Cardiomegaly. No acute infiltrate or pulmonary edema. Linear atelectasis left base.  Original Report Authenticated By: Natasha Mead, M.D.    Date: 07/31/2011  Rate: 104  Rhythm: atrial fibrillation  QRS Axis: left  Intervals: normal  ST/T Wave abnormalities: normal  Conduction Disutrbances:none  Narrative Interpretation:   Old EKG Reviewed: unchanged    Case discussed with Dr. Bobby Rumpf, MD 08/01/11 [phone]

## 2011-07-31 NOTE — ED Notes (Signed)
Patient transported to X-ray 

## 2011-07-31 NOTE — H&P (Signed)
[name] is an 61 y.o. male.   Chief Complaint: Progressive increasing shortness of breath/chest pain HPI: Patient is 61 year old male with past medical history significant for multiple medical problems i.e. nonischemic dilated cardiomyopathy status post ICD in the past history of recurrent systolic heart failure secondary to noncompliance to diet and medication, hypertension, chronic atrial fibrillation, history of DC cardioversion in the past converted back to his A. fib, glucose intolerance, history of nonsustained VT, GERD, chronic kidney disease stage IV, history of sarcoidosis, history of gouty arthritis, came to ER complaining of progressive increasing shortness of breath associated with abdominal and leg swelling for last few days. Also complains of vague right-sided chest pain off and on. Patient states he went to [place] and while coming back he lost his bag age and his medication for last 1-1/2 week he has not taken any medications and has noticed progressive increasing shortness of breath associated with abdominal and leg swelling. Patient also gives history of PND orthopnea and states has been sleeping on recliner for last few days. Patient denies any fever or chills. Denies noncompliance to diet states in fact he was doing well for last few months prior to this episode.  Past Medical History  Diagnosis Date  . Hypertension   . CHF (congestive heart failure)   . Sarcoidosis   . Cardiomyopathy     non ischemic by cath  . Acute on chronic systolic heart failure   . Automatic implantable cardiac defibrillator in situ   . Atrial fibrillation   . NSVT (nonsustained ventricular tachycardia)   . GERD (gastroesophageal reflux disease)   . Hypercholesteremia   . Coronary artery disease   . Myocardial infarction   . Shortness of breath   . Chronic kidney disease (CKD), stage III (moderate)     Past Surgical History  Procedure Date  . Back surgery 1987    Ruptured disk repair    . Pacemaker insertion     with ICD  . Tee without cardioversion 01/17/2011    Procedure: TRANSESOPHAGEAL ECHOCARDIOGRAM (TEE);  Surgeon: Ricki Rodriguez, MD;  Location: [facility] ENDOSCOPY;  Service: Cardiovascular;  Laterality: N/A;  . Cardioversion 01/17/2011    Procedure: CARDIOVERSION;  Surgeon: Ricki Rodriguez, MD;  Location: [facility] ENDOSCOPY;  Service: Cardiovascular;  Laterality: N/A;    Family History  Problem Relation Age of Onset  . Heart disease    . Heart failure    . Stroke    . Anesthesia problems Neg Hx   . Hypotension Neg Hx   . Malignant hyperthermia Neg Hx   . Pseudochol deficiency Neg Hx    Social History:  reports that he has never smoked. He does not have any smokeless tobacco history on file. He reports that he does not drink alcohol or use illicit drugs.  Allergies: No Known Allergies   (Not in a hospital admission)  Results for orders placed during the hospital encounter of 07/31/11 (from the past 48 hour(s))  TROPONIN I     Status: Normal   Collection Time   07/31/11  5:50 PM      Component Value Range Comment   Troponin I <0.30  <0.30 ng/mL   CBC WITH DIFFERENTIAL     Status: Abnormal   Collection Time   07/31/11  5:50 PM      Component Value Range Comment   WBC 5.4  4.0 - 10.5 K/uL    RBC 4.19 (*) 4.22 - 5.81 MIL/uL    Hemoglobin 13.0  13.0 - 17.0 g/dL    HCT 16.1 (*) 09.6 - 52.0 %    MCV 91.6  78.0 - 100.0 fL    MCH 31.0  26.0 - 34.0 pg    MCHC 33.9  30.0 - 36.0 g/dL    RDW 04.5  40.9 - 81.1 %    Platelets 212  150 - 400 K/uL    Neutrophils Relative 42 (*) 43 - 77 %    Neutro Abs 2.3  1.7 - 7.7 K/uL    Lymphocytes Relative 38  12 - 46 %    Lymphs Abs 2.1  0.7 - 4.0 K/uL    Monocytes Relative 11  3 - 12 %    Monocytes Absolute 0.6  0.1 - 1.0 K/uL    Eosinophils Relative 8 (*) 0 - 5 %    Eosinophils Absolute 0.4  0.0 - 0.7 K/uL    Basophils Relative 1  0 - 1 %    Basophils Absolute 0.0  0.0 - 0.1 K/uL   BASIC METABOLIC PANEL     Status: Abnormal    Collection Time   07/31/11  5:50 PM      Component Value Range Comment   Sodium 142  135 - 145 mEq/L    Potassium 4.1  3.5 - 5.1 mEq/L    Chloride 111  96 - 112 mEq/L    CO2 19  19 - 32 mEq/L    Glucose, Bld 112 (*) 70 - 99 mg/dL    BUN 26 (*) 6 - 23 mg/dL    Creatinine, Ser 9.14 (*) 0.50 - 1.35 mg/dL    Calcium 9.7  8.4 - 78.2 mg/dL    GFR calc non Af Amer 41 (*) >90 mL/min    GFR calc Af Amer 47 (*) >90 mL/min   PRO B NATRIURETIC PEPTIDE     Status: Abnormal   Collection Time   07/31/11  5:50 PM      Component Value Range Comment   Pro B Natriuretic peptide (BNP) 7983.0 (*) 0 - 125 pg/mL   PROTIME-INR     Status: Normal   Collection Time   07/31/11  5:50 PM      Component Value Range Comment   Prothrombin Time 14.6  11.6 - 15.2 seconds    INR 1.12  0.00 - 1.49    Dg Chest 2 View  07/31/2011  *RADIOLOGY REPORT*  Clinical Data: Chest pain, shortness of breath  CHEST - 2 VIEW  Comparison: 06/19/2011  Findings: Cardiomegaly is noted.  No acute infiltrate or pulmonary edema.  A single lead cardiac pacemaker is unchanged in position. There is linear atelectasis left base.  IMPRESSION: Cardiomegaly.  No acute infiltrate or pulmonary edema.  Linear atelectasis left base.  Original Report Authenticated By: Natasha Mead, M.D.    Review of Systems  Constitutional: Negative for fever and chills.  HENT: Negative for hearing loss.   Eyes: Negative for blurred vision and double vision.  Respiratory: Positive for shortness of breath. Negative for cough, hemoptysis and sputum production.   Cardiovascular: Positive for chest pain, orthopnea, leg swelling and PND. Negative for palpitations and claudication.  Gastrointestinal: Negative for nausea, vomiting and abdominal pain.  Neurological: Negative for dizziness and headaches.    Blood pressure 137/102, pulse 93, temperature 98.2 F (36.8 C), temperature source Oral, resp. rate 28, SpO2 95.00%. Physical Exam  Constitutional: He is oriented to  person, place, and time. He appears well-developed and well-nourished.  HENT:  Head: Normocephalic and  atraumatic.  Eyes: Conjunctivae are normal. Left eye exhibits no discharge. No scleral icterus.  Neck: Normal range of motion. Neck supple. JVD present. No tracheal deviation present. No thyromegaly present.  Cardiovascular:       Irregularly irregular S1 and S2 soft there was soft systolic murmur and S3 gallop  Respiratory:       Decreased breath sound at bases with bibasilar Rales  GI: Soft. Bowel sounds are normal. He exhibits distension. There is no tenderness. There is no rebound and no guarding.  Musculoskeletal:       No clubbing cyanosis 1+ edema noted  Neurological: He is alert and oriented to person, place, and time.     Assessment/Plan Acute on chronic systolic heart failure secondary to noncompliance to medication Atypical chest pain rule out MI Nonischemic dilated cardiomyopathy status post ICD in the past Chronic atrial fibrillation with subtherapeutic INR secondary to noncompliance to medication History of nonsustained VT Hypertension Glucose intolerance Chronic kidney disease stage IV History of gouty arthritis History of GERD History of sarcoidosis Plan As per orders  Dontavion Noxon N 07/31/2011, 9:22 PM

## 2011-07-31 NOTE — Progress Notes (Signed)
ANTICOAGULATION CONSULT NOTE - Initial Consult  Pharmacy Consult for heparin + coumadin Indication: atrial fibrillation  No Known Allergies  Patient Measurements:   Heparin Dosing Weight: 100.3kg  Vital Signs: Temp: 98.2 F (36.8 C) (07/29 2117) Temp src: Oral (07/29 2117) BP: 137/102 mmHg (07/29 2117) Pulse Rate: 93  (07/29 1733)  Labs:  Basename 07/31/11 1750  HGB 13.0  HCT 38.4*  PLT 212  APTT --  LABPROT 14.6  INR 1.12  HEPARINUNFRC --  CREATININE 1.74*  CKTOTAL --  CKMB --  TROPONINI <0.30    The CrCl is unknown because both a height and weight (above a minimum accepted value) are required for this calculation.   Medical History: Past Medical History  Diagnosis Date  . Hypertension   . CHF (congestive heart failure)   . Sarcoidosis   . Cardiomyopathy     non ischemic by cath  . Acute on chronic systolic heart failure   . Automatic implantable cardiac defibrillator in situ   . Atrial fibrillation   . NSVT (nonsustained ventricular tachycardia)   . GERD (gastroesophageal reflux disease)   . Hypercholesteremia   . Coronary artery disease   . Myocardial infarction   . Shortness of breath   . Chronic kidney disease (CKD), stage III (moderate)     Medications:   (Not in a hospital admission)  Assessment: 17 yom presented to the ED with increasing SOB + CP. He has been out of his meds for about a week because his luggage had been lost while traveling. He is on chronic coumadin for afib. His INR is subtherapeutic at 1.12. H/H and plts are WNL. To initiate heparin while INR is subtherapeutic and resume his warfarin.   Goal of Therapy:  INR 2-3 Heparin level 0.3-0.7 units/ml Monitor platelets by anticoagulation protocol: Yes   Plan:  1. Heparin bolus 4000 units IV x 1 2. Heparin gtt 1400 units/hr 3. Check a 6 hour heparin level 4. Daily heparin level and CBC 5. Coumadin 12mg  PO x 1 tonight 6. Daily INR  Sarinity Dicicco, Drake Leach 07/31/2011,9:52  PM

## 2011-07-31 NOTE — ED Notes (Signed)
Pt reports several day hx of sob with swelling to stomach and lower extremities. Pt reports 2 day hx of central chest pressure. Denies diaphoresis or nausea.

## 2011-08-01 LAB — BASIC METABOLIC PANEL
BUN: 24 mg/dL — ABNORMAL HIGH (ref 6–23)
CO2: 21 mEq/L (ref 19–32)
Calcium: 9.3 mg/dL (ref 8.4–10.5)
Chloride: 107 mEq/L (ref 96–112)
Creatinine, Ser: 2 mg/dL — ABNORMAL HIGH (ref 0.50–1.35)

## 2011-08-01 LAB — CBC
MCH: 30.7 pg (ref 26.0–34.0)
Platelets: 194 10*3/uL (ref 150–400)
RBC: 4.23 MIL/uL (ref 4.22–5.81)
WBC: 6.3 10*3/uL (ref 4.0–10.5)

## 2011-08-01 LAB — PROTIME-INR: Prothrombin Time: 14.1 seconds (ref 11.6–15.2)

## 2011-08-01 LAB — CARDIAC PANEL(CRET KIN+CKTOT+MB+TROPI)
Relative Index: 1.4 (ref 0.0–2.5)
Relative Index: 1.5 (ref 0.0–2.5)
Total CK: 539 U/L — ABNORMAL HIGH (ref 7–232)
Total CK: 423 U/L — ABNORMAL HIGH (ref 7–232)
Troponin I: <0.3 ng/mL (ref <0.30)

## 2011-08-01 LAB — HEPARIN LEVEL (UNFRACTIONATED): Heparin Unfractionated: 0.52 IU/mL (ref 0.30–0.70)

## 2011-08-01 LAB — TSH: TSH: 4.478 u[IU]/mL (ref 0.350–4.500)

## 2011-08-01 MED ORDER — WARFARIN SODIUM 6 MG PO TABS
12.0000 mg | ORAL_TABLET | Freq: Once | ORAL | Status: AC
  Administered 2011-08-01: 12 mg via ORAL
  Filled 2011-08-01: qty 2

## 2011-08-01 NOTE — Progress Notes (Signed)
CRITICAL VALUE ALERT  Critical value received:  CKMB: 11.1  Date of notification:  07/31/11  Time of notification:  2348  Critical value read back:yes  Nurse who received alert:  Nickolas Madrid, RN  MD notified (1st page):  Dr. [name]  MD aware of Critical value.  Orders given to continue cycles of cardiac enzymes. Will continue to monitor.

## 2011-08-01 NOTE — Progress Notes (Signed)
Subjective:  No anginal chest pain or states breathing is improved feels little better Objective:  Vital Signs in the last 24 hours: Temp:  [98.1 F (36.7 C)-98.5 F (36.9 C)] 98.1 F (36.7 C) (07/30 0515) Pulse Rate:  [82-93] 82  (07/30 0515) Resp:  [20-28] 20  (07/30 0515) BP: (106-137)/(75-102) 106/75 mmHg (07/30 0515) SpO2:  [95 %-100 %] 100 % (07/30 0515) FiO2 (%):  [2 %] 2 % (07/29 1808) Weight:  [103.42 kg (228 lb)-104.327 kg (230 lb)] 103.42 kg (228 lb) (07/30 0515)  Intake/Output from previous day: 07/29 0701 - 07/30 0700 In: 243 [P.O.:240; I.V.:3] Out: 900 [Urine:900] Intake/Output from this shift: Total I/O In: 120 [P.O.:120] Out: -   Physical Exam: Neck: no adenopathy, no carotid bruit, no JVD and supple, symmetrical, trachea midline Lungs: Bibasilar Rales noted Heart: irregularly irregular rhythm, S1, S2 normal and Soft systolic murmur and S3 gallop noted Abdomen: soft, non-tender; bowel sounds normal; no masses,  no organomegaly Extremities: extremities normal, atraumatic, no cyanosis or edema  Lab Results:  [facility] 08/01/11 0633 07/31/11 2237  WBC 6.3 6.0  HGB 13.0 14.8  PLT 194 245    Basename 08/01/11 0633 07/31/11 2237  NA 141 140  K 3.7 3.6  CL 107 104  CO2 21 26  GLUCOSE 86 100*  BUN 24* 24*  CREATININE 2.00* 1.76*    Basename 08/01/11 0981 07/31/11 2236  TROPONINI <0.30 <0.30   Hepatic Function Panel  Basename 07/31/11 2237  PROT 8.7*  ALBUMIN 3.5  AST 83*  ALT 63*  ALKPHOS 67  BILITOT 1.0  BILIDIR --  IBILI --   No results found for this basename: CHOL in the last 72 hours No results found for this basename: PROTIME in the last 72 hours  Imaging: Imaging results have been reviewed and Dg Chest 2 View  07/31/2011  *RADIOLOGY REPORT*  Clinical Data: Chest pain, shortness of breath  CHEST - 2 VIEW  Comparison: 06/19/2011  Findings: Cardiomegaly is noted.  No acute infiltrate or pulmonary edema.  A single lead cardiac pacemaker  is unchanged in position. There is linear atelectasis left base.  IMPRESSION: Cardiomegaly.  No acute infiltrate or pulmonary edema.  Linear atelectasis left base.  Original Report Authenticated By: Natasha Mead, M.D.    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  LOS: 1 day    Ralpheal Zappone N 08/01/2011, 9:55 AM

## 2011-08-01 NOTE — Progress Notes (Signed)
Pts BP 98/48 manually this AM. Spoke with Dr. [name], he asked to hold Bidil this AM. That was done. Will continue to monitor. [name], [name]

## 2011-08-01 NOTE — Progress Notes (Signed)
Dr. [name] notified of patients elevated CKMB 7.6 and CK 539. No further orders at this time. [name], Melida Quitter

## 2011-08-01 NOTE — Progress Notes (Signed)
ANTICOAGULATION CONSULT NOTE   Pharmacy Consult for Heparin and Coumadin Indication: atrial fibrillation  No Known Allergies  Patient Measurements: Height: 6\' 3"  (190.5 cm) Weight: 228 lb (103.42 kg) (scale a) IBW/kg (Calculated) : 84.5  Heparin Dosing Weight: 100.3kg  Vital Signs: Temp: 98.1 F (36.7 C) (07/30 0515) Temp src: Oral (07/30 0515) BP: 106/75 mmHg (07/30 0515) Pulse Rate: 82  (07/30 0515)  Labs:  Basename 08/01/11 1610 07/31/11 2237 07/31/11 2236 07/31/11 1750  HGB 13.0 14.8 -- --  HCT 38.3* 44.2 -- 38.4*  PLT 194 245 -- 212  APTT -- 32 -- --  LABPROT 14.1 14.2 -- 14.6  INR 1.07 1.08 -- 1.12  HEPARINUNFRC 0.52 -- -- --  CREATININE -- 1.76* -- 1.74*  CKTOTAL -- -- 877* --  CKMB -- -- 11.1* --  TROPONINI -- -- <0.30 <0.30    Estimated Creatinine Clearance: 57.4 ml/min (by C-G formula based on Cr of 1.76).  Assessment: 61 yo male Afib for anticoagulation   Goal of Therapy:  INR 2-3 Heparin level 0.3-0.7 units/ml Monitor platelets by anticoagulation protocol: Yes   Plan:  Continue Heparin at current rate  Coumadin 12 mg today  Eddie Candle 08/01/2011,7:20 AM

## 2011-08-02 LAB — BASIC METABOLIC PANEL
CO2: 25 mEq/L (ref 19–32)
Calcium: 9.4 mg/dL (ref 8.4–10.5)
Chloride: 102 mEq/L (ref 96–112)
Glucose, Bld: 93 mg/dL (ref 70–99)
Sodium: 139 mEq/L (ref 135–145)

## 2011-08-02 LAB — HEPARIN LEVEL (UNFRACTIONATED)
Heparin Unfractionated: 1.07 IU/mL — ABNORMAL HIGH (ref 0.30–0.70)
Heparin Unfractionated: 1.04 IU/mL — ABNORMAL HIGH (ref 0.30–0.70)

## 2011-08-02 LAB — CBC
HCT: 40.8 % (ref 39.0–52.0)
Hemoglobin: 14.1 g/dL (ref 13.0–17.0)
RBC: 4.53 MIL/uL (ref 4.22–5.81)
WBC: 5.2 10*3/uL (ref 4.0–10.5)

## 2011-08-02 LAB — PRO B NATRIURETIC PEPTIDE: Pro B Natriuretic peptide (BNP): 4120 pg/mL — ABNORMAL HIGH (ref 0–125)

## 2011-08-02 LAB — PROTIME-INR
INR: 1.32 (ref 0.00–1.49)
Prothrombin Time: 16.6 seconds — ABNORMAL HIGH (ref 11.6–15.2)

## 2011-08-02 MED ORDER — ZOLPIDEM TARTRATE 5 MG PO TABS
5.0000 mg | ORAL_TABLET | Freq: Every evening | ORAL | Status: DC | PRN
  Administered 2011-08-02 – 2011-08-05 (×3): 5 mg via ORAL
  Filled 2011-08-02 (×3): qty 1

## 2011-08-02 MED ORDER — HEPARIN (PORCINE) IN NACL 100-0.45 UNIT/ML-% IJ SOLN
1000.0000 [IU]/h | INTRAMUSCULAR | Status: DC
  Administered 2011-08-03: 1000 [IU]/h via INTRAVENOUS
  Filled 2011-08-02 (×3): qty 250

## 2011-08-02 MED ORDER — WARFARIN SODIUM 6 MG PO TABS
12.0000 mg | ORAL_TABLET | Freq: Once | ORAL | Status: AC
  Administered 2011-08-02: 12 mg via ORAL
  Filled 2011-08-02: qty 2

## 2011-08-02 MED ORDER — HEPARIN (PORCINE) IN NACL 100-0.45 UNIT/ML-% IJ SOLN
1250.0000 [IU]/h | INTRAMUSCULAR | Status: DC
  Administered 2011-08-02: 1250 [IU]/h via INTRAVENOUS
  Filled 2011-08-02 (×2): qty 250

## 2011-08-02 NOTE — Progress Notes (Signed)
ANTICOAGULATION CONSULT NOTE   Pharmacy Consult for Heparin and Coumadin Indication: atrial fibrillation  No Known Allergies  Patient Measurements: Height: 6\' 3"  (190.5 cm) Weight: 224 lb (101.606 kg) (a scale) IBW/kg (Calculated) : 84.5  Heparin Dosing Weight: 100.3kg  Vital Signs: Temp: 97.7 F (36.5 C) (07/31 0426) Temp src: Oral (07/31 0426) BP: 98/79 mmHg (07/31 0426) Pulse Rate: 77  (07/31 0426)  Labs:  Basename 08/02/11 0545 08/01/11 1419 08/01/11 0633 07/31/11 2237 07/31/11 2236 07/31/11 1750  HGB 14.1 -- 13.0 -- -- --  HCT 40.8 -- 38.3* 44.2 -- --  PLT 224 -- 194 245 -- --  APTT -- -- -- 32 -- --  LABPROT 16.6* -- 14.1 14.2 -- --  INR 1.32 -- 1.07 1.08 -- --  HEPARINUNFRC 1.07* -- 0.52 -- -- --  CREATININE -- -- 2.00* 1.76* -- 1.74*  CKTOTAL -- 423* 539* -- 877* --  CKMB -- 6.5* 7.6* -- 11.1* --  TROPONINI -- <0.30 <0.30 -- <0.30 --    Estimated Creatinine Clearance: 50.1 ml/min (by C-G formula based on Cr of 2).  Assessment: 61 yo male Afib for anticoagulation   Goal of Therapy:  INR 2-3 Heparin level 0.3-0.7 units/ml Monitor platelets by anticoagulation protocol: Yes   Plan:  Hold Heparin x 1 hour, then decrease Heparin 1250 units/hr Check heparin level in 6 hours. Coumadin 12 mg today  Eddie Candle 08/02/2011,6:51 AM

## 2011-08-02 NOTE — Progress Notes (Signed)
Subjective:  Patient denies any chest pain states breathing has improved Objective:  Vital Signs in the last 24 hours: Temp:  [97.3 F (36.3 C)-98.4 F (36.9 C)] 97.4 F (36.3 C) (07/31 1421) Pulse Rate:  [72-85] 79  (07/31 1421) Resp:  [20-22] 20  (07/31 1421) BP: (96-122)/(72-85) 122/85 mmHg (07/31 1421) SpO2:  [96 %-100 %] 98 % (07/31 1421) Weight:  [101.606 kg (224 lb)] 101.606 kg (224 lb) (07/31 0426)  Intake/Output from previous day: 07/30 0701 - 07/31 0700 In: 1880.5 [P.O.:1320; I.V.:560.5] Out: 3875 [Urine:3875] Intake/Output from this shift: Total I/O In: 960 [P.O.:960] Out: 1125 [Urine:1125]  Physical Exam: Neck: no adenopathy, no carotid bruit, no JVD and supple, symmetrical, trachea midline Lungs: Bibasilar Rales noted in air entry has improved Heart: irregularly irregular rhythm, S1, S2 normal and Soft systolic murmur and S3 gallop noted Abdomen: soft, non-tender; bowel sounds normal; no masses,  no organomegaly Extremities: extremities normal, atraumatic, no cyanosis or edema  Lab Results:  Basename 08/02/11 0545 08/01/11 0633  WBC 5.2 6.3  HGB 14.1 13.0  PLT 224 194    Basename 08/02/11 0545 08/01/11 0633  NA 139 141  K 3.8 3.7  CL 102 107  CO2 25 21  GLUCOSE 93 86  BUN 31* 24*  CREATININE 2.40* 2.00*    Basename 08/01/11 1419 08/01/11 0633  TROPONINI <0.30 <0.30   Hepatic Function Panel  Basename 07/31/11 2237  PROT 8.7*  ALBUMIN 3.5  AST 83*  ALT 63*  ALKPHOS 67  BILITOT 1.0  BILIDIR --  IBILI --   No results found for this basename: CHOL in the last 72 hours No results found for this basename: PROTIME in the last 72 hours  Imaging: Imaging results have been reviewed and Dg Chest 2 View  07/31/2011  *RADIOLOGY REPORT*  Clinical Data: Chest pain, shortness of breath  CHEST - 2 VIEW  Comparison: 06/19/2011  Findings: Cardiomegaly is noted.  No acute infiltrate or pulmonary edema.  A single lead cardiac pacemaker is unchanged in  position. There is linear atelectasis left base.  IMPRESSION: Cardiomegaly.  No acute infiltrate or pulmonary edema.  Linear atelectasis left base.  Original Report Authenticated By: Natasha Mead, M.D.    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

## 2011-08-02 NOTE — Progress Notes (Signed)
ANTICOAGULATION CONSULT NOTE - Follow Up Consult  Pharmacy Consult for Heparin Indication: atrial fibrillation  No Known Allergies  Patient Measurements: Height: 6\' 3"  (190.5 cm) Weight: 224 lb (101.606 kg) (a scale) IBW/kg (Calculated) : 84.5  Heparin Dosing Weight: 100.3 kg  Vital Signs: Temp: 97.4 F (36.3 C) (07/31 1421) Temp src: Oral (07/31 1421) BP: 122/85 mmHg (07/31 1421) Pulse Rate: 79  (07/31 1421)  Labs:  Basename 08/02/11 1605 08/02/11 0545 08/01/11 1419 08/01/11 0633 07/31/11 2237 07/31/11 2236  HGB -- 14.1 -- 13.0 -- --  HCT -- 40.8 -- 38.3* 44.2 --  PLT -- 224 -- 194 245 --  APTT -- -- -- -- 32 --  LABPROT -- 16.6* -- 14.1 14.2 --  INR -- 1.32 -- 1.07 1.08 --  HEPARINUNFRC 1.04* 1.07* -- 0.52 -- --  CREATININE -- 2.40* -- 2.00* 1.76* --  CKTOTAL -- -- 423* 539* -- 877*  CKMB -- -- 6.5* 7.6* -- 11.1*  TROPONINI -- -- <0.30 <0.30 -- <0.30    Estimated Creatinine Clearance: 41.7 ml/min (by C-G formula based on Cr of 2.4).   Medications:  Infusions:    . heparin 1,250 Units/hr (08/02/11 0730)  . DISCONTD: heparin 1,400 Units/hr (08/02/11 0343)    Assessment: 61 yo male on heparin to bridge Coumadin for Afib while INR is low. Heparin level this morning was >1, heparin was held for 1 hr and the rate was decreased to 1250 units/hr. Follow-up heparin level is also >1. I spoke with the nurse who was in the room with the patient. Lab drew blood from right arm and heparin is infusing in the left arm. Appears level was drawn appropriately. Pump settings were correct. No bleeding noted.  Goal of Therapy:  Heparin level 0.3-0.7 units/ml Monitor platelets by anticoagulation protocol: Yes   Plan:  -Hold heparin for 1 hr then resume at 1000 units/hr -Heparin level 6 hours after resumed   [facility], [place].D., BCPS Clinical Pharmacist Pager: [phone] 08/02/2011 5:59 PM

## 2011-08-02 NOTE — Plan of Care (Signed)
Problem: Phase I Progression Outcomes Goal: EF % per last Echo/documented,Core Reminder form on chart Outcome: Completed/Met Date Met:  08/02/11 ECHO 02/20/11 20 to 25 %

## 2011-08-03 LAB — CBC
Hemoglobin: 15.1 g/dL (ref 13.0–17.0)
MCH: 31.6 pg (ref 26.0–34.0)
MCHC: 35.6 g/dL (ref 30.0–36.0)
RDW: 14.5 % (ref 11.5–15.5)

## 2011-08-03 LAB — PROTIME-INR: Prothrombin Time: 18.7 seconds — ABNORMAL HIGH (ref 11.6–15.2)

## 2011-08-03 LAB — BASIC METABOLIC PANEL
BUN: 36 mg/dL — ABNORMAL HIGH (ref 6–23)
BUN: 40 mg/dL — ABNORMAL HIGH (ref 6–23)
Chloride: 97 mEq/L (ref 96–112)
Creatinine, Ser: 2.56 mg/dL — ABNORMAL HIGH (ref 0.50–1.35)
GFR calc non Af Amer: 25 mL/min — ABNORMAL LOW (ref >90)
Glucose, Bld: 113 mg/dL — ABNORMAL HIGH (ref 70–99)
Glucose, Bld: 86 mg/dL (ref 70–99)
Potassium: 3.7 mEq/L (ref 3.5–5.1)
Potassium: 4.1 mEq/L (ref 3.5–5.1)

## 2011-08-03 MED ORDER — FUROSEMIDE 80 MG PO TABS
80.0000 mg | ORAL_TABLET | Freq: Two times a day (BID) | ORAL | Status: DC
  Administered 2011-08-03 – 2011-08-05 (×4): 80 mg via ORAL
  Filled 2011-08-03 (×6): qty 1

## 2011-08-03 MED ORDER — WARFARIN SODIUM 6 MG PO TABS
12.0000 mg | ORAL_TABLET | Freq: Once | ORAL | Status: AC
  Administered 2011-08-03: 12 mg via ORAL
  Filled 2011-08-03: qty 2

## 2011-08-03 MED ORDER — CARVEDILOL 3.125 MG PO TABS
3.1250 mg | ORAL_TABLET | Freq: Two times a day (BID) | ORAL | Status: DC
  Administered 2011-08-03 – 2011-08-05 (×3): 3.125 mg via ORAL
  Filled 2011-08-03 (×7): qty 1

## 2011-08-03 NOTE — Progress Notes (Signed)
3:30 AM    Heparin level remains slightly high at 0.76. No bleeding complications noted. Will continue at current rate of 10units/kg/hr and recheck in 6 hours. And will adjust downward if the Xa level remains elevated. Presuming saturation from the previous levels--most likely via bolus.  Janice Coffin

## 2011-08-03 NOTE — Progress Notes (Signed)
Subjective:  Patient denies any chest pain or shortness of breath denies any palpitations. Hent few beats of nonsustained VT asymptomatic on the monitor Objective:  Vital Signs in the last 24 hours: Temp:  [97.4 F (36.3 C)-98 F (36.7 C)] 98 F (36.7 C) (08/01 0439) Pulse Rate:  [57-79] 72  (08/01 1013) Resp:  [18-20] 18  (08/01 0439) BP: (107-122)/(68-85) 108/68 mmHg (08/01 1013) SpO2:  [93 %-98 %] 96 % (08/01 0439) Weight:  [100.109 kg (220 lb 11.2 oz)] 100.109 kg (220 lb 11.2 oz) (08/01 0439)  Intake/Output from previous day: 07/31 0701 - 08/01 0700 In: 2566.3 [P.O.:2296; I.V.:270.3] Out: 2850 [Urine:2850] Intake/Output from this shift: Total I/O In: 240 [P.O.:240] Out: 0   Physical Exam: Neck: no adenopathy, no carotid bruit, no JVD and supple, symmetrical, trachea midline Lungs: Decreased breath sounds at the bases with occasional rales  Heart: irregularly irregular rhythm, S1, S2 normal and Soft systolic murmur and Ama that appropriate Abdomen: soft, non-tender; bowel sounds normal; no masses,  no organomegaly Extremities: extremities normal, atraumatic, no cyanosis or edema Pulses: 2+ and symmetric  Lab Results:  Basename 08/03/11 0050 08/02/11 0545  WBC 5.0 5.2  HGB 15.1 14.1  PLT 252 224    Basename 08/03/11 0050 08/02/11 0545  NA 137 139  K 3.7 3.8  CL 98 102  CO2 26 25  GLUCOSE 113* 93  BUN 36* 31*  CREATININE 2.56* 2.40*    Basename 08/01/11 1419 08/01/11 0633  TROPONINI <0.30 <0.30   Hepatic Function Panel  Basename 07/31/11 2237  PROT 8.7*  ALBUMIN 3.5  AST 83*  ALT 63*  ALKPHOS 67  BILITOT 1.0  BILIDIR --  IBILI --   No results found for this basename: CHOL in the last 72 hours No results found for this basename: PROTIME in the last 72 hours  Imaging: Imaging results have been reviewed and No results found.  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

## 2011-08-03 NOTE — Progress Notes (Signed)
Pharmacist Heart Failure Core Measure Documentation  Assessment: [name] has an EF documented as 20-25% on 02/20/11 by ECHO.  Rationale: Heart failure patients with left ventricular systolic dysfunction (LVSD) and an EF < 40% should be prescribed an angiotensin converting enzyme inhibitor (ACEI) or angiotensin receptor blocker (ARB) at discharge unless a contraindication is documented in the medical record.  This patient is not currently on an ACEI or ARB for HF.  This note is being placed in the record in order to provide documentation that a contraindication to the use of these agents is present for this encounter.  ACE Inhibitor or Angiotensin Receptor Blocker is contraindicated (specify all that apply)  []   ACEI allergy AND ARB allergy []   Angioedema []   Moderate or severe aortic stenosis []   Hyperkalemia []   Hypotension []   Renal artery stenosis [x]   Worsening renal function, preexisting renal disease or dysfunction   [name], [name] 08/03/2011 1:32 PM

## 2011-08-03 NOTE — ED Provider Notes (Signed)
I have seen and examined this patient with the resident.  I agree with the resident's note, assessment and plan except as indicated.     Nat Christen, MD 08/03/11 0100

## 2011-08-03 NOTE — Progress Notes (Signed)
Pt with 21 beat run of VT, sitting up in chair, asymptomatic with no no distress. VS stable. MD made aware/ orders noted.

## 2011-08-03 NOTE — Clinical Documentation Improvement (Signed)
RENAL FAILURE DOCUMENTATION CLARIFICATION QUERY  THIS DOCUMENT IS NOT A PERMANENT PART OF THE MEDICAL RECORD  Please update your documentation within the medical record to reflect your response to this query.                                                                                     08/03/11  Dr. [name] and/or Associates,  In a better effort to capture your patient's severity of illness, reflect appropriate length of stay and utilization of resources, a review of the patient medical record has revealed the following indicators:   Admitted for Acute on Chronic Systolic Heart Failure Treated with IV Lasix Serial Chemistries  "Chronic kidney disease stage IV" [name],Darrell N  07/31/2011, 9:22 PM     H&P  Bun/Cr/GFR   (black male)  this admission Creat has increased 0.82 mg/dl GFR has decreased > 65%, from 47 to 29   Component     Latest Ref Rng 07/31/2011 08/01/2011 08/02/2011 08/03/2011  BUN     6 - 23 mg/dL 26 (H) 24 (H) 31 (H) 36 (H)  Creat     0.50 - 1.35 mg/dL 7.84 (H) 6.96 (H) 2.95 (H) 2.56 (H)  GFR calc Af Amer     >90 mL/min 47 (L) 40 (L) 32 (L) 29 (L)     Based on your clinical judgment, please document in the progress notes and discharge summary if a condition below provides greater specificity regarding the patient's renal status during this admission:   - Acute Renal Failure on Stage 4 Chronic Kidney Disease   - Acute Kidney Injury on Stage 4 Chronic Kidney Disease   - Acute on Chronic Renal Failure   - Other Condition   - Unable to Clinically Determine   In responding to this query please exercise your independent judgment.    The fact that a query is asked, does not imply that any particular answer is desired or expected.   Reviewed: 08/10/11 - query not addressed - [email or web address].  [name] rn  Thank You,  [name]  RN BSN CCDS Certified Clinical Documentation Specialist:  Pager  Health Information Management [place]    TO  RESPOND TO THE THIS QUERY, FOLLOW THE INSTRUCTIONS BELOW:  1. If needed, update documentation for the patient's encounter via the notes activity.  2. Access this query again and click edit on the In [employer].  3. After updating, or not, click F2 to complete all highlighted (required) fields concerning your review. Select "additional documentation in the medical record" OR "no additional documentation provided".  4. Click Sign note button.  5. The deficiency will fall out of your In Basket *Please let us know if you are not able to complete this workflow by phone or e-mail (listed below).

## 2011-08-03 NOTE — Progress Notes (Signed)
ANTICOAGULATION CONSULT NOTE - Follow Up Consult  Pharmacy Consult for Heparin/Coumadin Indication: atrial fibrillation  No Known Allergies  Patient Measurements: Height: 6\' 3"  (190.5 cm) Weight: 220 lb 11.2 oz (100.109 kg) (a scale) IBW/kg (Calculated) : 84.5  Heparin Dosing Weight: 100.3 kg  Vital Signs: Temp: 98 F (36.7 C) (08/01 0439) Temp src: Oral (08/01 0439) BP: 108/68 mmHg (08/01 1013) Pulse Rate: 72  (08/01 1013)  Labs:  Basename 08/03/11 0904 08/03/11 0050 08/02/11 1605 08/02/11 0545 08/01/11 1419 08/01/11 0633 07/31/11 2237 07/31/11 2236  HGB -- 15.1 -- 14.1 -- -- -- --  HCT -- 42.4 -- 40.8 -- 38.3* -- --  PLT -- 252 -- 224 -- 194 -- --  APTT -- -- -- -- -- -- 32 --  LABPROT -- 18.7* -- 16.6* -- 14.1 -- --  INR -- 1.53* -- 1.32 -- 1.07 -- --  HEPARINUNFRC 0.52 0.76* 1.04* -- -- -- -- --  CREATININE -- 2.56* -- 2.40* -- 2.00* -- --  CKTOTAL -- -- -- -- 423* 539* -- 877*  CKMB -- -- -- -- 6.5* 7.6* -- 11.1*  TROPONINI -- -- -- -- <0.30 <0.30 -- <0.30    Estimated Creatinine Clearance: 36.2 ml/min (by C-G formula based on Cr of 2.56).   Medications:  Infusions:     . heparin 1,000 Units/hr (08/03/11 0414)  . DISCONTD: heparin Stopped (08/02/11 1819)    Assessment: 61 yo male on heparin to bridge Coumadin for Afib while INR is subtherapeutic. Heparin level this am 0.52 units/ml. INR 1.53 No bleeding noted.  Goal of Therapy:  Heparin level 0.3-0.7 units/ml Monitor platelets by anticoagulation protocol: Yes   Plan:  -Cont heparin at 1000 units/hr -Coumadin 12 mg again today. -Cont daily heparin level, CBC and INR.   Talbert Cage, Pharm.D. Clinical Pharmacist Pager: [phone] 08/03/2011 10:45 AM

## 2011-08-04 LAB — BASIC METABOLIC PANEL
CO2: 27 mEq/L (ref 19–32)
Chloride: 97 mEq/L (ref 96–112)
Glucose, Bld: 100 mg/dL — ABNORMAL HIGH (ref 70–99)
Potassium: 4.2 mEq/L (ref 3.5–5.1)
Sodium: 136 mEq/L (ref 135–145)

## 2011-08-04 LAB — CBC
HCT: 45.6 % (ref 39.0–52.0)
Hemoglobin: 15.9 g/dL (ref 13.0–17.0)
WBC: 5.1 10*3/uL (ref 4.0–10.5)

## 2011-08-04 LAB — PROTIME-INR: INR: 1.96 — ABNORMAL HIGH (ref 0.00–1.49)

## 2011-08-04 MED ORDER — ALUM & MAG HYDROXIDE-SIMETH 200-200-20 MG/5ML PO SUSP
30.0000 mL | Freq: Four times a day (QID) | ORAL | Status: DC | PRN
  Administered 2011-08-05: 30 mL via ORAL
  Filled 2011-08-04: qty 30

## 2011-08-04 MED ORDER — WARFARIN SODIUM 6 MG PO TABS
9.0000 mg | ORAL_TABLET | Freq: Once | ORAL | Status: AC
  Administered 2011-08-04: 9 mg via ORAL
  Filled 2011-08-04: qty 1

## 2011-08-04 MED ORDER — HEPARIN (PORCINE) IN NACL 100-0.45 UNIT/ML-% IJ SOLN
950.0000 [IU]/h | INTRAMUSCULAR | Status: DC
  Administered 2011-08-04 – 2011-08-05 (×2): 950 [IU]/h via INTRAVENOUS
  Filled 2011-08-04 (×3): qty 250

## 2011-08-04 NOTE — Progress Notes (Signed)
   CARE MANAGEMENT NOTE 08/04/2011  Patient:  Darrell, [name]   Account Number:  [id number]  Date Initiated:  08/04/2011  Documentation initiated by:  Tera Mater  Subjective/Objective Assessment:   61yo male admitted with CHF.  Pt. lives with his parents.     Action/Plan:   Discharge planning   Anticipated DC Date:  08/05/2011   Anticipated DC Plan:  HOME W HOME HEALTH SERVICES      DC Planning Services  CM consult      Choice offered to / List presented to:             Status of service:  In process, will continue to follow Medicare Important Message given?   (If response is "NO", the following Medicare IM given date fields will be blank) Date Medicare IM given:   Date Additional Medicare IM given:    Discharge Disposition:    Per UR Regulation:  Reviewed for med. necessity/level of care/duration of stay  If discussed at Long Length of Stay Meetings, dates discussed:    Comments:  08/04/11 1507 Tera Mater, RN, BSN NCM [phone] Has had IV diuretics, po Lasix started 8/1.  May discharge home tomorrow 8/3.  No needs noted at this time.

## 2011-08-04 NOTE — Progress Notes (Signed)
Subjective:  Patient denies any further palpitations. Denies any chest pain or shortness of breath. Patient had the episode of nonsustained VT yesterday no ICD discharges. Feels better today  Objective:  Vital Signs in the last 24 hours: Temp:  [97.2 F (36.2 C)-98 F (36.7 C)] 97.2 F (36.2 C) (08/02 0600) Pulse Rate:  [68-81] 79  (08/02 1022) Resp:  [18-20] 18  (08/02 0600) BP: (98-118)/(72-83) 98/79 mmHg (08/02 0910) SpO2:  [97 %-100 %] 100 % (08/02 0600) Weight:  [99.292 kg (218 lb 14.4 oz)] 99.292 kg (218 lb 14.4 oz) (08/02 0600)  Intake/Output from previous day: 08/01 0701 - 08/02 0700 In: 1189.8 [P.O.:900; I.V.:289.8] Out: 1425 [Urine:1425] Intake/Output from this shift: Total I/O In: 240 [P.O.:240] Out: -   Physical Exam: Neck: no adenopathy, no carotid bruit, no JVD and supple, symmetrical, trachea midline Lungs: Decreased breath sound at bases Heart: irregularly irregular rhythm, S1, S2 normal and Soft systolic murmur and S3 gallop noted Abdomen: soft, non-tender; bowel sounds normal; no masses,  no organomegaly Extremities: extremities normal, atraumatic, no cyanosis or edema  Lab Results:  Basename 08/04/11 0650 08/03/11 0050  WBC 5.1 5.0  HGB 15.9 15.1  PLT 221 252    Basename 08/04/11 0650 08/03/11 2055  NA 136 138  K 4.2 4.1  CL 97 97  CO2 27 29  GLUCOSE 100* 86  BUN 43* 40*  CREATININE 2.74* 2.58*    Basename 08/01/11 1419  TROPONINI <0.30   Hepatic Function Panel No results found for this basename: PROT,ALBUMIN,AST,ALT,ALKPHOS,BILITOT,BILIDIR,IBILI in the last 72 hours No results found for this basename: CHOL in the last 72 hours No results found for this basename: PROTIME in the last 72 hours  Imaging: Imaging results have been reviewed and No results found.  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  LOS: 4 days    Josi Roediger N 08/04/2011, 12:01 PM

## 2011-08-04 NOTE — Progress Notes (Signed)
Pt's BP 98/79.   Asymptomatic.  Dr. [name] notified.  INstructed to hold coreg this am.  Will cont. To monitor.  Amanda Pea, Charity fundraiser.

## 2011-08-04 NOTE — Progress Notes (Signed)
ANTICOAGULATION CONSULT NOTE - Follow Up Consult  Pharmacy Consult for Heparin/Coumadin Indication: atrial fibrillation  No Known Allergies  Patient Measurements: Height: 6\' 3"  (190.5 cm) Weight: 218 lb 14.4 oz (99.292 kg) (Scale A.) IBW/kg (Calculated) : 84.5  Heparin Dosing Weight: 100.3 kg  Vital Signs: Temp: 97.2 F (36.2 C) (08/02 0600) Temp src: Oral (08/02 0600) BP: 98/79 mmHg (08/02 0910) Pulse Rate: 79  (08/02 1022)  Labs:  Basename 08/04/11 0650 08/03/11 2055 08/03/11 0904 08/03/11 0050 08/02/11 0545 08/01/11 1419  HGB 15.9 -- -- 15.1 -- --  HCT 45.6 -- -- 42.4 40.8 --  PLT 221 -- -- 252 224 --  APTT -- -- -- -- -- --  LABPROT 22.7* -- -- 18.7* 16.6* --  INR 1.96* -- -- 1.53* 1.32 --  HEPARINUNFRC 0.62 -- 0.52 0.76* -- --  CREATININE 2.74* 2.58* -- 2.56* -- --  CKTOTAL -- -- -- -- -- 423*  CKMB -- -- -- -- -- 6.5*  TROPONINI -- -- -- -- -- <0.30    Estimated Creatinine Clearance: 33.8 ml/min (by C-G formula based on Cr of 2.74).  Assessment: 61 yo male on heparin to bridge Coumadin for Afib while INR is subtherapeutic. Heparin level this am 0.62 units/ml, starting to slightly creep up again. INR 1.96 after good increase from yesterday. No bleeding noted.  Goal of Therapy:  Heparin level 0.3-0.7 units/ml Monitor platelets by anticoagulation protocol: Yes   Plan:  1. Slightly decrease heparin to 950 units/hr 2. Coumadin 9mg  PO x 1 tonight (home dose) 3. F/u AM INR and heparin level 4. Consider reducing colchicine dose due to renal function (see sticky note)  Lysle Pearl, PharmD, BCPS Pager # [phone] 08/04/2011 11:14 AM

## 2011-08-05 LAB — BASIC METABOLIC PANEL
CO2: 23 mEq/L (ref 19–32)
Calcium: 9.9 mg/dL (ref 8.4–10.5)
Chloride: 94 mEq/L — ABNORMAL LOW (ref 96–112)
Sodium: 133 mEq/L — ABNORMAL LOW (ref 135–145)

## 2011-08-05 LAB — CBC
Hemoglobin: 16.4 g/dL (ref 13.0–17.0)
MCHC: 36.8 g/dL — ABNORMAL HIGH (ref 30.0–36.0)
RBC: 5.08 MIL/uL (ref 4.22–5.81)

## 2011-08-05 LAB — PROTIME-INR
INR: 2.58 — ABNORMAL HIGH (ref 0.00–1.49)
Prothrombin Time: 28.1 seconds — ABNORMAL HIGH (ref 11.6–15.2)

## 2011-08-05 LAB — PRO B NATRIURETIC PEPTIDE: Pro B Natriuretic peptide (BNP): 949.2 pg/mL — ABNORMAL HIGH (ref 0–125)

## 2011-08-05 LAB — HEPARIN LEVEL (UNFRACTIONATED): Heparin Unfractionated: 0.58 IU/mL (ref 0.30–0.70)

## 2011-08-05 MED ORDER — DIGOXIN 250 MCG PO TABS: 250.0000 ug | ORAL_TABLET | Freq: Every day | ORAL | Status: DC

## 2011-08-05 MED ORDER — AMIODARONE HCL 200 MG PO TABS: 200.0000 mg | ORAL_TABLET | Freq: Every day | ORAL | Status: DC

## 2011-08-05 MED ORDER — PANTOPRAZOLE SODIUM 40 MG PO TBEC: 40.0000 mg | DELAYED_RELEASE_TABLET | Freq: Every day | ORAL | Status: DC

## 2011-08-05 MED ORDER — ALLOPURINOL 100 MG PO TABS: 100.0000 mg | ORAL_TABLET | Freq: Every day | ORAL | Status: DC

## 2011-08-05 MED ORDER — FUROSEMIDE 80 MG PO TABS: 80.0000 mg | ORAL_TABLET | Freq: Two times a day (BID) | ORAL | Status: DC

## 2011-08-05 MED ORDER — CARVEDILOL 6.25 MG PO TABS: 3.1250 mg | ORAL_TABLET | Freq: Two times a day (BID) | ORAL | Status: DC

## 2011-08-05 MED ORDER — SUCRALFATE 1 G PO TABS: 1.0000 g | ORAL_TABLET | Freq: Three times a day (TID) | ORAL | Status: DC

## 2011-08-05 MED ORDER — METOLAZONE 5 MG PO TABS: 2.5000 mg | ORAL_TABLET | Freq: Every day | ORAL | Status: DC

## 2011-08-05 MED ORDER — ISOSORB DINITRATE-HYDRALAZINE 20-37.5 MG PO TABS: 1.0000 | ORAL_TABLET | Freq: Three times a day (TID) | ORAL | Status: DC

## 2011-08-05 MED ORDER — WARFARIN SODIUM 6 MG PO TABS
9.0000 mg | ORAL_TABLET | Freq: Every day | ORAL | Status: DC
  Filled 2011-08-05: qty 1

## 2011-08-05 MED ORDER — WARFARIN SODIUM 6 MG PO TABS: 9.0000 mg | ORAL_TABLET | Freq: Every day | ORAL | Status: DC

## 2011-08-05 MED ORDER — POTASSIUM CHLORIDE CRYS ER 20 MEQ PO TBCR: 20.0000 meq | EXTENDED_RELEASE_TABLET | Freq: Two times a day (BID) | ORAL | Status: DC

## 2011-08-05 MED ORDER — ROSUVASTATIN CALCIUM 5 MG PO TABS: 5.0000 mg | ORAL_TABLET | Freq: Every day | ORAL | Status: DC

## 2011-08-05 NOTE — Discharge Summary (Signed)
  Discharge summary dictated on 08/05/2011 dictation number is [phone]

## 2011-08-05 NOTE — Progress Notes (Signed)
ANTICOAGULATION CONSULT NOTE - Follow Up Consult  Pharmacy Consult for Heparin/Coumadin Indication: atrial fibrillation  No Known Allergies  Patient Measurements: Height: 6\' 3"  (190.5 cm) Weight: 218 lb 1.6 oz (98.93 kg) (scale a) IBW/kg (Calculated) : 84.5  Heparin Dosing Weight: 100.3 kg  Vital Signs: Temp: 97.5 F (36.4 C) (08/03 0451) Temp src: Oral (08/03 0451) BP: 92/68 mmHg (08/03 0451) Pulse Rate: 71  (08/03 0451)  Labs:  Alvira Philips 08/05/11 5784 08/04/11 0650 08/03/11 2055 08/03/11 0904 08/03/11 0050  HGB 16.4 15.9 -- -- --  HCT 44.6 45.6 -- -- 42.4  PLT 232 221 -- -- 252  APTT -- -- -- -- --  LABPROT 28.1* 22.7* -- -- 18.7*  INR 2.58* 1.96* -- -- 1.53*  HEPARINUNFRC 0.58 0.62 -- 0.52 --  CREATININE 2.74* 2.74* 2.58* -- --  CKTOTAL -- -- -- -- --  CKMB -- -- -- -- --  TROPONINI -- -- -- -- --    Estimated Creatinine Clearance: 33.8 ml/min (by C-G formula based on Cr of 2.74).  Assessment: 61 yo male on heparin to bridge Coumadin for Afib while INR is subtherapeutic. Heparin level this am 0.58 units/ml.  INR 2.58  No bleeding noted.  Goal of Therapy:  INR 2-3 Heparin level 0.3-0.7 units/ml Monitor platelets by anticoagulation protocol: Yes   Plan:  1. D/C heparin 2. Coumadin 9mg  PO x 1 tonight (home dose) 3. F/u AM INR  4. Consider reducing colchicine dose due to renal function (see sticky note)  Talbert Cage, PharmD Pager # [phone]  08/05/2011 11:50 AM

## 2011-08-05 NOTE — Discharge Summary (Signed)
[name], [name]             ACCOUNT NO.:  [id number]  MEDICAL RECORD NO.:  [id number]  LOCATION:  4711                         FACILITY:  [facility]  PHYSICIAN:  Kennidi Yoshida N. [name], M.D. DATE OF BIRTH:  [birth year]  DATE OF ADMISSION:  07/31/2011 DATE OF DISCHARGE:  08/05/2011                              DISCHARGE SUMMARY   ADMITTING DIAGNOSES: 1. Acute on chronic systolic heart failure secondary to noncompliance     to medication. 2. Atypical chest pain, rule out myocardial infarction. 3. 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 4. Chronic atrial fibrillation with subtherapeutic INR secondary to     noncompliance to the medication. 5. History of nonsustained ventricular tachycardia. 6. Hypertension. 7. Glucose intolerance. 8. Chronic kidney disease, stage IV. 9. History of gouty arthritis. 10.History of gastroesophageal reflux disease. 11.History of sarcoidosis.  DISCHARGE DIAGNOSES: 1. Compensated systolic heart failure. 2. Status post atypical chest pain, myocardial infarction ruled out. 3. 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 4. Chronic atrial fibrillation. 5. History of nonsustained ventricular tachycardia. 6. Hypertension. 7. Glucose intolerance. 8. Acute on chronic kidney disease stage IV, stable. 9. History of gouty arthritis. 10.Gastroesophageal reflux disease. 11.History of sarcoidosis.  DISCHARGE HOME MEDICATIONS: 1. BiDil 20/37.5 mg 1 tablet 3 times daily. 2. Carvedilol 3.125 mg 1 tablet twice daily. 3. Zaroxolyn 2.5 mg 1 tablet daily. 4. Allopurinol 100 mg 1 tablet daily as before. 5. Amiodarone 200 mg 1 tablet daily. 6. Enteric-coated aspirin 81 mg 1 tablet daily. 7. Colchicine 0.6 mg daily. 8. Digoxin 0.25 mg 1 tablet daily. 9. Lasix 80 mg 1 tablet twice daily. 10.Protonix 40 mg 1 tablet daily. 11.Potassium chloride 20 mEq 1 tablet twice daily. 12.Crestor 5 mg 1 tablet daily. 13.Carafate 1 g 4 times  daily as before. 14.Warfarin 9 mg daily.  DIET:  Low salt, low cholesterol.  The patient has been advised to monitor PT/INR early next week.  The patient has been advised to check PT/INR early next week and then every monthly.  Heart failure instructions have been given.  The patient has been advised to take medications regularly.  Follow up with me in 1 week.  CONDITION AT DISCHARGE:  Stable.  BRIEF HISTORY AND HOSPITAL COURSE:  Line is a 61 year old black male with past medical history significant for multiple medical problems, i.e., nonischemic dilated cardiomyopathy status post ICD in the past, history of recurrent systolic heart failure secondary to noncompliance to diet and medications, hypertension, chronic atrial fibrillation, history of DC cardioversion in the past, converted back to AFib, glucose intolerance, history of nonsustained VT, GERD, chronic kidney disease stage IV, history of sarcoidosis, history of gouty arthritis.  He came to the ER complaining of progressive increasing shortness of breath associated with abdominal and leg swelling for last few days.  Also complains of vague right-sided chest pain off and on.  The patient states he went to [place] and while coming back, he lost his bag and his medication.  For the last 1-1/2 weeks, he has not taken any medication.  He was noted to have progressive increasing shortness of breath associated with abdominal and leg swelling.  The patient also gives history of PND, orthopnea,  and states has been sleeping on recliner for last few days.  The patient denies any fever or chills. Denies noncompliance to diet.  States in fact he was doing well for last 3 months prior to this episode.  PAST MEDICAL HISTORY:  As above.  PAST SURGICAL HISTORY:  He had back surgery in the past.  Had ICD placed in the past.  Had TEE cardioversion in January of 2013.  PHYSICAL EXAMINATION:  GENERAL:  He was alert, awake, oriented x3 in  no acute distress. VITAL SIGNS:  Blood pressure was 137/102, pulse was 93.  He was afebrile. HEENT:  Conjunctivae pink. NECK:  Supple.  Positive JVD. CARDIOVASCULAR EXAM:  Irregularly irregular.  S1, S2 was soft.  There was soft systolic murmur and S3 gallop. LUNGS:  He has decreased breath sounds at bases with bibasilar rales. ABDOMEN:  Soft.  Bowel sounds are present.  Mildly distended. EXTREMITIES:  There was no clubbing, cyanosis.  There was 1+ edema.  LABS:  Sodium was 142, potassium 4.1, BUN 26, creatinine 1.74.  His BNP was 57846.  Repeat BNP was 4120.  Today BNP is 949.  His 4 sets of troponin I were negative.  CK was 877, MB 11.1, repeat CK 539, MB 7.6, next set CK 423, MB 6.5, which is trending down.  Troponin I 4 sets were less than 0.30.  Hemoglobin was 13, hematocrit 38.4, white count of 5.4. Repeat hemoglobin today is 16.4, hematocrit 44.6, white count of 5.6. His chest x-ray showed cardiomegaly with no acute infiltrate or pulmonary edema, linear atelectasis, left base.  EKG showed atrial fibrillation with PVCs, left anterior fascicular block, ST-T wave changes in lateral leads.  BRIEF HOSPITAL COURSE:  The patient was admitted to telemetry unit, was restarted on his all medication.  Lasix was switched initially to IV and then switched back to p.o.  His BNP is trending down.  The patient is diuresing well.  His creatinine did bump up to 2.74, which has been stable for last 2 days.  His Zaroxolyn dose has been reduced.  The patient will be discharged home on above medications and will follow his renal function and PT/INR as outpatient.  Heart failure instructions have been given.  The patient has been advised regarding his lifestyle modification, diet, and compliance with medication.     [name]. [name], M.D.     [facility]/MEDQ  D:  08/05/2011  T:  08/05/2011  Job:  [id number]

## 2011-08-11 IMAGING — CR DG CHEST 1V PORT
1 series · 1 of 1 positions shown · non-contrast
Comparison: 02/13/2010

CLINICAL DATA: 60-year-old with chest palpitations.  Shortness of
breath with exertion.

PORTABLE CHEST - 1 VIEW

[AP]
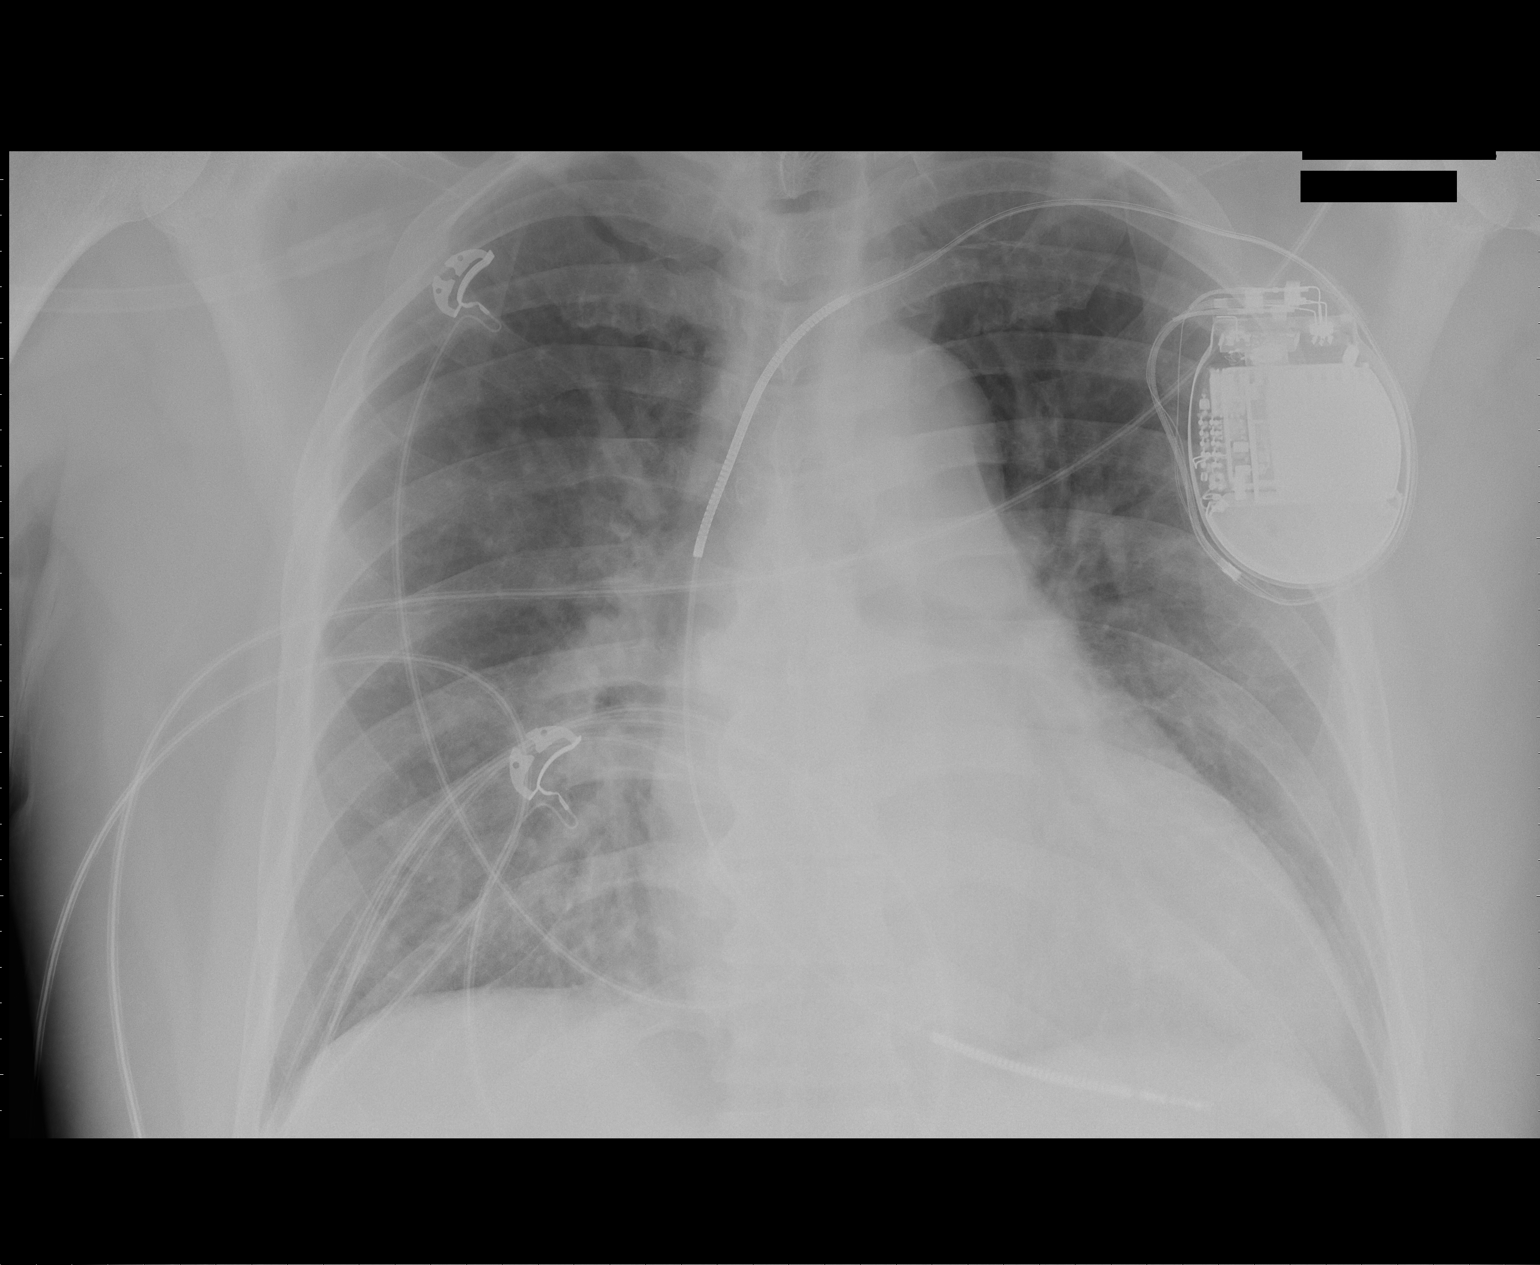

[1 of 1 positions shown; findings below may reference images not displayed]

FINDINGS: Single view of the chest demonstrates a left cardiac ICD.
There are perihilar densities suggesting vascular congestion.
Heart size is upper limits of normal.  No focal airspace disease.
IMPRESSION: Perihilar densities suggestive for vascular congestion.  No focal
airspace disease.

## 2011-08-15 ENCOUNTER — Encounter (HOSPITAL_COMMUNITY): Payer: Self-pay | Admitting: Pharmacy Technician

## 2011-08-16 ENCOUNTER — Other Ambulatory Visit (HOSPITAL_COMMUNITY): Payer: Self-pay | Admitting: Orthopaedic Surgery

## 2011-08-17 ENCOUNTER — Encounter (HOSPITAL_COMMUNITY): Payer: Self-pay

## 2011-08-17 ENCOUNTER — Encounter (HOSPITAL_COMMUNITY)
Admission: RE | Admit: 2011-08-17 | Discharge: 2011-08-17 | Disposition: A | Discharge status: Home / Self Care | Payer: PRIVATE HEALTH INSURANCE | Source: Ambulatory Visit | Attending: Orthopaedic Surgery | Admitting: Orthopaedic Surgery

## 2011-08-17 HISTORY — DX: Gout, unspecified: M10.9

## 2011-08-17 LAB — URINALYSIS, ROUTINE W REFLEX MICROSCOPIC
Glucose, UA: NEGATIVE mg/dL
Hgb urine dipstick: NEGATIVE
Leukocytes, UA: NEGATIVE
Specific Gravity, Urine: 1.011 (ref 1.005–1.030)
pH: 5 (ref 5.0–8.0)

## 2011-08-17 LAB — COMPREHENSIVE METABOLIC PANEL
ALT: 83 U/L — ABNORMAL HIGH (ref 0–53)
AST: 84 U/L — ABNORMAL HIGH (ref 0–37)
Albumin: 3.4 g/dL — ABNORMAL LOW (ref 3.5–5.2)
Alkaline Phosphatase: 73 U/L (ref 39–117)
Glucose, Bld: 107 mg/dL — ABNORMAL HIGH (ref 70–99)
Potassium: 4 mEq/L (ref 3.5–5.1)
Sodium: 138 mEq/L (ref 135–145)
Total Protein: 7.5 g/dL (ref 6.0–8.3)

## 2011-08-17 LAB — CBC
Hemoglobin: 14.5 g/dL (ref 13.0–17.0)
MCHC: 34.8 g/dL (ref 30.0–36.0)
Platelets: 234 10*3/uL (ref 150–400)

## 2011-08-17 LAB — APTT: aPTT: 35 seconds (ref 24–37)

## 2011-08-17 MED ORDER — CEFAZOLIN SODIUM-DEXTROSE 2-3 GM-% IV SOLR
2.0000 g | INTRAVENOUS | Status: DC
  Filled 2011-08-17: qty 50

## 2011-08-17 NOTE — Consult Note (Addendum)
Anesthesia Chart Review:  Patient is a 61 year old male posted for C5-6, C6-7 ACDF by Dr. Ophelia Charter on 08/18/2011. His PAT appointment was on the afternoon of 08/17/2011.   His history includes nonischemic dilated cardiomyopathy, status post [employer] ICD 01/14/07, chronic atrial fibrillation s/p cardioversion 01/2011, chronic systolic CHF, CKD stage III-IV, GERD, Sarcoidosis, HTN, gouty arthritis, non-smoker, hypercholesterolemia.  He was hospitalized from 07/31/2011 to 08/05/2011 for acute on chronic systolic heart failure secondary to noncompliance. He also presented with atypical chest pain, but ruled out for myocardial infarction. He also had an episode of nonsustained ventricular tachycardia without ICD discharges during that admission.     His primary Cardiologist is Dr. [name].  His EP Cardiologist is [name]. Dr. [name] has not seen Mr. Molner since his discharge from the hospital on 08/05/11.  EKG on 08/01/11 showed atrial fibrillation at 86 beats per minute with premature ventricular or aberrantly conducted complexes, left anterior fascicular block, lateral ST/T wave abnormality, consider ischemia. Although the lateral T-wave abnormality was not really apparent on his unconfirmed EKG from 06/19/11, it was present on a prior EKG from 04/26/11.    2D Echo on 02/20/11 showed: - Left ventricle: The cavity size was mildly dilated. Systolic function was severely reduced. The estimated ejection fraction was in the range of 20% to 25%. Diffuse hypokinesis. - Aortic valve: Mild regurgitation. - Mitral valve: Mild regurgitation. - Left atrium: The atrium was mildly dilated. - Atrial septum: No defect or patent foramen ovale was identified. - Tricuspid valve: Moderate regurgitation. - Pulmonary arteries: PA peak pressure: 56mm Hg (S).  TEE on 01/17/11 showed: - Left ventricle: Systolic function was moderately reduced. The estimated ejection fraction was in the range of 35% to 40%. Diffuse  hypokinesis. - Aortic valve: Trivial regurgitation. - Mitral valve: Mild regurgitation. - Left atrium: The atrium was dilated. No evidence of thrombus in the atrial cavity or appendage. There was moderatecontinuous spontaneous echo contrast ("smoke") in the cavity. - Right ventricle: Systolic function was mildly reduced. - Right atrium: No evidence of thrombus in the atrial cavity or appendage. - Atrial septum: No defect or patent foramen ovale was Identified.  Nuclear stress test on 12/06/09 showed: Reduced left ventricular function with quantitative ejection  fraction of 20%. Global hypokinesis present as well as dyskinetic wall motion involving the septum and inferior wall. Inferior wall scar present without evidence of inducible ischemia.  Cardiac cath on 04/27/06 showed "The LV was severely dilated. There was global hypokinesia. EF of 10% to 15%. The left main was long which was patent. LAD was  patent. Diagonal 1 to diagonal 2 were small, which were patent. Ramus was patent. Left circumflex was patent. The OM1 was moderate size, which was patent. RCA has patent."  CXR on 07/31/11 showed Cardiomegaly. No acute infiltrate or pulmonary edema. Linear atelectasis left base.  Labs reviewed.  BUN/Cr are stable at 51/2.50.  Glucose is 107.  AST/ALT are elevated at 84/83 (AST/ALT were 83/63 on 07/31/11).  CBC is WNL.  PT/INR 19.0/1.56.  PTT 35.  (I called his PT/INR and PTT results to [facility] at Dr. Bonnita Hollow office.)  Patient will need repeat coags and BMET pre-operatively.  I reviewed above with Anesthesiologist Dr. Gypsy Balsam who agrees that due to patient's extensive medical history, recent admission for CHF, and history of non-compliance that he will need clearance from Dr. [name].  I have contacted Dr. Annitta Jersey and Elnita Maxwell at Dr. Bonnita Hollow office.    Shonna Chock, PA-C 08/17/11 1610  Update 08/17/11 1700:  I received a note from Dr. [name] that stated" "Patient high risk for cardioembolic CVA/MI in  view of (decreased) LV systolic function and chronic afib.  Patient non-compliant to meds and follow-up.  Not seen since d/c in 04/28/11 and 08/05/11.  No labs either since above discharges as out patient."  I called and spoke with Dr. [name] to further clarify.  He said that because Mr. Boyajian had non-ischemic cardiomyopathy with recent echo (02/2011) and no ischemia by 2011 stress and patent coronaries by cath that he would not plan to repeat any cardiac testing at this time.  If he had known about surgery, he would have recommended a Lovenox bridge at this point would not be feasible since patient's surgery is scheduled for tomorrow.  Dr. [name] thought Mr. Egolf may have seen Nephrologist Dr. Lowell Guitar in the past, but could not confirm this.  He reports that patient's baseline Cr is ~ 2.0, but had been up to ~2.75 during his last admission.  Patient will need to be examined by his Anesthesiologist pre-operatively to evaluate for any recurrent CHF symptoms and follow-up lab results.  If labs are reasonable and patient is not experiencing any acute CV/CHF then can likely proceed to the OR--with known risk as outlined by Dr. [name].  I reviewed above again with Dr. Maryann Conners after my communication with Dr. [name], and he agreed with this plan.  (I did not update Dr. Bonnita Hollow office further since his office was closed.)

## 2011-08-17 NOTE — Pre-Procedure Instructions (Signed)
20 [name]  08/17/2011   Your procedure is scheduled on:  Friday, August 16th  Report to [facility] at 0530 AM.  Call this number if you have problems the morning of surgery: [phone]   Remember:   Do not eat food or drink:After Midnight.  Take these medicines the morning of surgery with A SIP OF WATER: coreg, colchicine, digoxin, protonix, allopurinol, carafate   Do not wear jewelry, make-up or nail polish.  Do not wear lotions, powders, or perfumes.  Do not shave 48 hours prior to surgery. Men may shave face and neck.  Do not bring valuables to the hospital.  Contacts, dentures or bridgework may not be worn into surgery.  Leave suitcase in the car. After surgery it may be brought to your room.  For patients admitted to the hospital, checkout time is 11:00 AM the day of discharge.    Special Instructions: CHG Shower Use Special Wash: 1/2 bottle night before surgery and 1/2 bottle morning of surgery.   Please read over the following fact sheets that you were given: Pain Booklet, Coughing and Deep Breathing, MRSA Information and Surgical Site Infection Prevention

## 2011-08-17 NOTE — Progress Notes (Signed)
Arlys John with [employer] notified. Also form faxed to dr Ladona Ridgel.  Revonda Standard will review chart pre op. Recent hospitalization in epic.

## 2011-08-18 ENCOUNTER — Encounter (HOSPITAL_COMMUNITY): Payer: Self-pay | Admitting: *Deleted

## 2011-08-18 ENCOUNTER — Ambulatory Visit (HOSPITAL_COMMUNITY)
Admission: RE | Admit: 2011-08-18 | Discharge: 2011-08-18 | Disposition: A | Discharge status: Home / Self Care | Payer: PRIVATE HEALTH INSURANCE | Source: Ambulatory Visit | Attending: Orthopaedic Surgery | Admitting: Orthopaedic Surgery

## 2011-08-18 ENCOUNTER — Encounter (HOSPITAL_COMMUNITY): Payer: Self-pay | Admitting: Pharmacy Technician

## 2011-08-18 ENCOUNTER — Inpatient Hospital Stay (HOSPITAL_COMMUNITY)
Admission: EM | Admit: 2011-08-18 | Discharge: 2011-08-23 | DRG: 982 | Disposition: A | Discharge status: Home / Self Care | Payer: PRIVATE HEALTH INSURANCE | Attending: Cardiovascular Disease | Admitting: Cardiovascular Disease

## 2011-08-18 ENCOUNTER — Encounter (HOSPITAL_COMMUNITY): Payer: Self-pay

## 2011-08-18 ENCOUNTER — Encounter (HOSPITAL_COMMUNITY)
Admission: RE | Disposition: A | Discharge status: Home / Self Care | Payer: Self-pay | Source: Ambulatory Visit | Attending: Orthopaedic Surgery

## 2011-08-18 ENCOUNTER — Emergency Department (HOSPITAL_COMMUNITY): Payer: PRIVATE HEALTH INSURANCE

## 2011-08-18 DIAGNOSIS — E739 Lactose intolerance, unspecified: Secondary | ICD-10-CM | POA: Diagnosis present

## 2011-08-18 DIAGNOSIS — I428 Other cardiomyopathies: Secondary | ICD-10-CM | POA: Diagnosis present

## 2011-08-18 DIAGNOSIS — K089 Disorder of teeth and supporting structures, unspecified: Secondary | ICD-10-CM | POA: Diagnosis present

## 2011-08-18 DIAGNOSIS — I5022 Chronic systolic (congestive) heart failure: Secondary | ICD-10-CM | POA: Diagnosis present

## 2011-08-18 DIAGNOSIS — M47812 Spondylosis without myelopathy or radiculopathy, cervical region: Secondary | ICD-10-CM | POA: Diagnosis present

## 2011-08-18 DIAGNOSIS — I472 Ventricular tachycardia, unspecified: Secondary | ICD-10-CM | POA: Diagnosis present

## 2011-08-18 DIAGNOSIS — I4891 Unspecified atrial fibrillation: Secondary | ICD-10-CM | POA: Diagnosis present

## 2011-08-18 DIAGNOSIS — Z95810 Presence of automatic (implantable) cardiac defibrillator: Secondary | ICD-10-CM

## 2011-08-18 DIAGNOSIS — D869 Sarcoidosis, unspecified: Secondary | ICD-10-CM | POA: Diagnosis present

## 2011-08-18 DIAGNOSIS — Z01812 Encounter for preprocedural laboratory examination: Secondary | ICD-10-CM

## 2011-08-18 DIAGNOSIS — I129 Hypertensive chronic kidney disease with stage 1 through stage 4 chronic kidney disease, or unspecified chronic kidney disease: Secondary | ICD-10-CM | POA: Diagnosis present

## 2011-08-18 DIAGNOSIS — Z5309 Procedure and treatment not carried out because of other contraindication: Secondary | ICD-10-CM | POA: Insufficient documentation

## 2011-08-18 DIAGNOSIS — J329 Chronic sinusitis, unspecified: Secondary | ICD-10-CM | POA: Diagnosis present

## 2011-08-18 DIAGNOSIS — R339 Retention of urine, unspecified: Secondary | ICD-10-CM | POA: Diagnosis not present

## 2011-08-18 DIAGNOSIS — M109 Gout, unspecified: Secondary | ICD-10-CM | POA: Diagnosis present

## 2011-08-18 DIAGNOSIS — I509 Heart failure, unspecified: Secondary | ICD-10-CM | POA: Diagnosis present

## 2011-08-18 DIAGNOSIS — N184 Chronic kidney disease, stage 4 (severe): Secondary | ICD-10-CM | POA: Diagnosis present

## 2011-08-18 DIAGNOSIS — Z5181 Encounter for therapeutic drug level monitoring: Secondary | ICD-10-CM

## 2011-08-18 DIAGNOSIS — R0789 Other chest pain: Principal | ICD-10-CM | POA: Diagnosis present

## 2011-08-18 DIAGNOSIS — Z7901 Long term (current) use of anticoagulants: Secondary | ICD-10-CM | POA: Insufficient documentation

## 2011-08-18 DIAGNOSIS — I2510 Atherosclerotic heart disease of native coronary artery without angina pectoris: Secondary | ICD-10-CM | POA: Diagnosis present

## 2011-08-18 DIAGNOSIS — I4729 Other ventricular tachycardia: Secondary | ICD-10-CM | POA: Diagnosis present

## 2011-08-18 DIAGNOSIS — E7800 Pure hypercholesterolemia, unspecified: Secondary | ICD-10-CM | POA: Diagnosis present

## 2011-08-18 DIAGNOSIS — E876 Hypokalemia: Secondary | ICD-10-CM | POA: Diagnosis not present

## 2011-08-18 DIAGNOSIS — K219 Gastro-esophageal reflux disease without esophagitis: Secondary | ICD-10-CM | POA: Diagnosis present

## 2011-08-18 DIAGNOSIS — R079 Chest pain, unspecified: Secondary | ICD-10-CM

## 2011-08-18 DIAGNOSIS — I252 Old myocardial infarction: Secondary | ICD-10-CM

## 2011-08-18 DIAGNOSIS — G479 Sleep disorder, unspecified: Secondary | ICD-10-CM | POA: Diagnosis present

## 2011-08-18 LAB — POCT I-STAT, CHEM 8
BUN: 53 mg/dL — ABNORMAL HIGH (ref 6–23)
Calcium, Ion: 1.25 mmol/L (ref 1.13–1.30)
Chloride: 106 meq/L (ref 96–112)
Creatinine, Ser: 2.5 mg/dL — ABNORMAL HIGH (ref 0.50–1.35)
Glucose, Bld: 104 mg/dL — ABNORMAL HIGH (ref 70–99)
HCT: 44 % (ref 39.0–52.0)
Hemoglobin: 15 g/dL (ref 13.0–17.0)
Potassium: 3.5 mEq/L (ref 3.5–5.1)
Sodium: 140 mEq/L (ref 135–145)
TCO2: 25 mmol/L (ref 0–100)

## 2011-08-18 LAB — PROTIME-INR
INR: 1.5 — ABNORMAL HIGH (ref 0.00–1.49)
INR: 1.4 (ref 0.00–1.49)
Prothrombin Time: 18.4 seconds — ABNORMAL HIGH (ref 11.6–15.2)
Prothrombin Time: 17.4 seconds — ABNORMAL HIGH (ref 11.6–15.2)

## 2011-08-18 LAB — CBC WITH DIFFERENTIAL/PLATELET
Basophils Absolute: 0 10*3/uL (ref 0.0–0.1)
Basophils Relative: 1 % (ref 0–1)
Eosinophils Absolute: 0.4 10*3/uL (ref 0.0–0.7)
Eosinophils Relative: 8 % — ABNORMAL HIGH (ref 0–5)
HCT: 41.5 % (ref 39.0–52.0)
Hemoglobin: 14.3 g/dL (ref 13.0–17.0)
Lymphocytes Relative: 44 % (ref 12–46)
Lymphs Abs: 2.1 K/uL (ref 0.7–4.0)
MCH: 31.2 pg (ref 26.0–34.0)
MCHC: 34.5 g/dL (ref 30.0–36.0)
MCV: 90.4 fL (ref 78.0–100.0)
Monocytes Absolute: 0.6 K/uL (ref 0.1–1.0)
Monocytes Relative: 12 % (ref 3–12)
Neutro Abs: 1.7 10*3/uL (ref 1.7–7.7)
Neutrophils Relative %: 35 % — ABNORMAL LOW (ref 43–77)
Platelets: 243 K/uL (ref 150–400)
RBC: 4.59 MIL/uL (ref 4.22–5.81)
RDW: 13.7 % (ref 11.5–15.5)
WBC: 4.8 K/uL (ref 4.0–10.5)

## 2011-08-18 LAB — TROPONIN I: Troponin I: <0.3 ng/mL (ref <0.30)

## 2011-08-18 LAB — BASIC METABOLIC PANEL
CO2: 26 mEq/L (ref 19–32)
Glucose, Bld: 99 mg/dL (ref 70–99)
Potassium: 3.7 mEq/L (ref 3.5–5.1)
Sodium: 140 mEq/L (ref 135–145)

## 2011-08-18 LAB — APTT: aPTT: 31 seconds (ref 24–37)

## 2011-08-18 SURGERY — ANTERIOR CERVICAL DECOMPRESSION/DISCECTOMY FUSION 2 LEVELS
Anesthesia: General

## 2011-08-18 MED ORDER — HEPARIN (PORCINE) IN NACL 100-0.45 UNIT/ML-% IJ SOLN
1000.0000 [IU]/h | INTRAMUSCULAR | Status: DC
  Administered 2011-08-18 – 2011-08-21 (×3): 1000 [IU]/h via INTRAVENOUS
  Filled 2011-08-18 (×6): qty 250

## 2011-08-18 MED ORDER — MORPHINE SULFATE 4 MG/ML IJ SOLN
4.0000 mg | Freq: Once | INTRAMUSCULAR | Status: AC
  Administered 2011-08-19: 4 mg via INTRAVENOUS
  Filled 2011-08-18: qty 1

## 2011-08-18 MED ORDER — AMIODARONE HCL 200 MG PO TABS
200.0000 mg | ORAL_TABLET | Freq: Every day | ORAL | Status: DC
  Administered 2011-08-19 – 2011-08-23 (×5): 200 mg via ORAL
  Filled 2011-08-18 (×6): qty 1

## 2011-08-18 MED ORDER — HEPARIN BOLUS VIA INFUSION
4000.0000 [IU] | Freq: Once | INTRAVENOUS | Status: AC
  Administered 2011-08-18: 4000 [IU] via INTRAVENOUS

## 2011-08-18 MED ORDER — FUROSEMIDE 80 MG PO TABS
80.0000 mg | ORAL_TABLET | Freq: Two times a day (BID) | ORAL | Status: DC
  Administered 2011-08-19 – 2011-08-23 (×7): 80 mg via ORAL
  Filled 2011-08-18 (×4): qty 4
  Filled 2011-08-18: qty 1
  Filled 2011-08-18 (×2): qty 4
  Filled 2011-08-18 (×3): qty 1
  Filled 2011-08-18: qty 4

## 2011-08-18 MED ORDER — BUPIVACAINE-EPINEPHRINE PF 0.25-1:200000 % IJ SOLN
INTRAMUSCULAR | Status: AC
  Filled 2011-08-18: qty 30

## 2011-08-18 MED ORDER — POTASSIUM CHLORIDE CRYS ER 20 MEQ PO TBCR
20.0000 meq | EXTENDED_RELEASE_TABLET | Freq: Two times a day (BID) | ORAL | Status: DC
  Administered 2011-08-19 – 2011-08-21 (×5): 20 meq via ORAL
  Filled 2011-08-18 (×7): qty 1

## 2011-08-18 MED ORDER — THROMBIN 20000 UNITS EX SOLR
CUTANEOUS | Status: AC
  Filled 2011-08-18: qty 20000

## 2011-08-18 MED ORDER — ALLOPURINOL 100 MG PO TABS
100.0000 mg | ORAL_TABLET | Freq: Every day | ORAL | Status: DC
  Administered 2011-08-19 – 2011-08-23 (×5): 100 mg via ORAL
  Filled 2011-08-18 (×6): qty 1

## 2011-08-18 MED ORDER — PANTOPRAZOLE SODIUM 40 MG PO TBEC
40.0000 mg | DELAYED_RELEASE_TABLET | Freq: Every day | ORAL | Status: DC
  Administered 2011-08-19 – 2011-08-23 (×4): 40 mg via ORAL
  Filled 2011-08-18: qty 1

## 2011-08-18 MED ORDER — ISOSORB DINITRATE-HYDRALAZINE 20-37.5 MG PO TABS
1.0000 | ORAL_TABLET | Freq: Three times a day (TID) | ORAL | Status: DC
  Administered 2011-08-19 – 2011-08-20 (×7): 1 via ORAL
  Filled 2011-08-18 (×10): qty 1

## 2011-08-18 MED ORDER — SUCRALFATE 1 G PO TABS
1.0000 g | ORAL_TABLET | Freq: Four times a day (QID) | ORAL | Status: DC
  Administered 2011-08-19 – 2011-08-23 (×10): 1 g via ORAL
  Filled 2011-08-18 (×21): qty 1

## 2011-08-18 MED ORDER — METOLAZONE 2.5 MG PO TABS
2.5000 mg | ORAL_TABLET | Freq: Every day | ORAL | Status: DC
  Administered 2011-08-19 – 2011-08-23 (×5): 2.5 mg via ORAL
  Filled 2011-08-18 (×5): qty 1

## 2011-08-18 MED ORDER — DIGOXIN 250 MCG PO TABS
0.2500 mg | ORAL_TABLET | Freq: Every day | ORAL | Status: DC
  Administered 2011-08-19 – 2011-08-23 (×4): 0.25 mg via ORAL
  Filled 2011-08-18: qty 1
  Filled 2011-08-18: qty 2
  Filled 2011-08-18: qty 1
  Filled 2011-08-18 (×2): qty 2
  Filled 2011-08-18: qty 1

## 2011-08-18 SURGICAL SUPPLY — 48 items
APL SKNCLS STERI-STRIP NONHPOA (GAUZE/BANDAGES/DRESSINGS)
BENZOIN TINCTURE PRP APPL 2/3 (GAUZE/BANDAGES/DRESSINGS) ×2 IMPLANT
BLADE SURG ROTATE 9660 (MISCELLANEOUS) IMPLANT
BUPIVACAINE 0.25% W/EPI IMPLANT
BUR ROUND FLUTED 4 SOFT TCH (BURR) ×2 IMPLANT
CLOTH BEACON ORANGE TIMEOUT ST (SAFETY) ×3 IMPLANT
COLLAR CERV LO CONTOUR FIRM DE (SOFTGOODS) ×1 IMPLANT
CORDS BIPOLAR (ELECTRODE) ×3 IMPLANT
COVER SURGICAL LIGHT HANDLE (MISCELLANEOUS) ×3 IMPLANT
DRAPE C-ARM 42X72 X-RAY (DRAPES) ×3 IMPLANT
DRAPE MICROSCOPE LEICA (MISCELLANEOUS) ×3 IMPLANT
DRAPE PROXIMA HALF (DRAPES) ×3 IMPLANT
DURAPREP 6ML APPLICATOR 50/CS (WOUND CARE) ×3 IMPLANT
ELECT COATED BLADE 2.86 ST (ELECTRODE) ×3 IMPLANT
ELECT REM PT RETURN 9FT ADLT (ELECTROSURGICAL)
ELECTRODE REM PT RTRN 9FT ADLT (ELECTROSURGICAL) ×1 IMPLANT
EVACUATOR 1/8 PVC DRAIN (DRAIN) ×1 IMPLANT
GAUZE XEROFORM 1X8 LF (GAUZE/BANDAGES/DRESSINGS) ×1 IMPLANT
GLOVE BIOGEL PI IND STRL 7.5 (GLOVE) ×2 IMPLANT
GLOVE BIOGEL PI IND STRL 8 (GLOVE) ×2 IMPLANT
GLOVE BIOGEL PI INDICATOR 7.5 (GLOVE) ×1
GLOVE BIOGEL PI INDICATOR 8 (GLOVE) ×1
GLOVE ECLIPSE 7.0 STRL STRAW (GLOVE) ×3 IMPLANT
GLOVE ORTHO TXT STRL SZ7.5 (GLOVE) ×3 IMPLANT
GOWN PREVENTION PLUS LG XLONG (DISPOSABLE) IMPLANT
GOWN PREVENTION PLUS XLARGE (GOWN DISPOSABLE) ×3 IMPLANT
GOWN STRL NON-REIN LRG LVL3 (GOWN DISPOSABLE) ×6 IMPLANT
HEAD HALTER (SOFTGOODS) ×1 IMPLANT
HEMOSTAT SURGICEL 2X14 (HEMOSTASIS) IMPLANT
KIT BASIN OR (CUSTOM PROCEDURE TRAY) ×3 IMPLANT
KIT ROOM TURNOVER OR (KITS) ×1 IMPLANT
MANIFOLD NEPTUNE II (INSTRUMENTS) ×1 IMPLANT
NDL 25GX 5/8IN NON SAFETY (NEEDLE) ×1 IMPLANT
NEEDLE 25GX 5/8IN NON SAFETY (NEEDLE) ×2 IMPLANT
NS IRRIG 1000ML POUR BTL (IV SOLUTION) ×3 IMPLANT
PACK ORTHO CERVICAL (CUSTOM PROCEDURE TRAY) ×3 IMPLANT
PAD ARMBOARD 7.5X6 YLW CONV (MISCELLANEOUS) ×2 IMPLANT
PATTIES SURGICAL .5 X.5 (GAUZE/BANDAGES/DRESSINGS) ×2 IMPLANT
SPONGE GAUZE 4X4 12PLY (GAUZE/BANDAGES/DRESSINGS) ×1 IMPLANT
STAPLER VISISTAT 35W (STAPLE) ×1 IMPLANT
STRIP CLOSURE SKIN 1/2X4 (GAUZE/BANDAGES/DRESSINGS) ×1 IMPLANT
SURGIFLO TRUKIT (HEMOSTASIS) IMPLANT
SUT VIC AB 3-0 X1 27 (SUTURE) ×3 IMPLANT
SUT VICRYL 4-0 PS2 18IN ABS (SUTURE) ×4 IMPLANT
SYR 30ML SLIP (SYRINGE) ×3 IMPLANT
TOWEL OR 17X24 6PK STRL BLUE (TOWEL DISPOSABLE) ×3 IMPLANT
TOWEL OR 17X26 10 PK STRL BLUE (TOWEL DISPOSABLE) ×3 IMPLANT
WATER STERILE IRR 1000ML POUR (IV SOLUTION) ×1 IMPLANT

## 2011-08-18 NOTE — OR Nursing (Signed)
Case cancelled due to pt INR elevated.

## 2011-08-18 NOTE — Progress Notes (Signed)
Spoke to [facility] in Dr. Bonnita Hollow office requesting new orders since pt rescheduled.

## 2011-08-18 NOTE — ED Provider Notes (Signed)
I saw and evaluated the patient, reviewed the resident's note and I agree with the findings and plan.  I reviewed and agree with Dr. Viann Shove ECG interpretation.  Pt with pending low back surgery, however requires to be constantly anticoagulated.  Pt is here to be admitted for bridge from coumadin to heparin in anticipation of surgery on Monday with Dr. Ophelia Charter.  Dr. Algie Coffer has seen pt and will admit.  Pt has no complaints at present.    Gavin Pound. Oletta Lamas, MD 08/18/11 2312

## 2011-08-18 NOTE — Progress Notes (Signed)
Patient arrives back to short stay alert and oriented. Patient cancelled per Dr. Ophelia Charter. Patient did not receive any medications for sedation will discharge patient to follow instructions per Dr. Ophelia Charter

## 2011-08-18 NOTE — H&P (Signed)
[name] is an 61 y.o. male.   Chief Complaint: Chest pain. HPI: 61 years old male is scheduled to have C-spine fusion surgery on Monday. He also has atrial fibrillation requiring chronic anticoagulation. He stopped his coumadin x 2 days and now has dull chest pain off and on. No fever or cough.  Past Medical History  Diagnosis Date  . CHF (congestive heart failure)   . Sarcoidosis   . Cardiomyopathy     non ischemic by cath  . Acute on chronic systolic heart failure   . Automatic implantable cardiac defibrillator in situ   . Atrial fibrillation   . NSVT (nonsustained ventricular tachycardia)   . GERD (gastroesophageal reflux disease)   . Hypercholesteremia   . Coronary artery disease   . Myocardial infarction   . Shortness of breath   . Chronic kidney disease (CKD), stage III (moderate)   . Pacemaker   . Anginal pain   . Gout   . Hypertension     dr [name]      Past Surgical History  Procedure Date  . Back surgery 1987    Ruptured disk repair  . Pacemaker insertion     with ICD  . Tee without cardioversion 01/17/2011    Procedure: TRANSESOPHAGEAL ECHOCARDIOGRAM (TEE);  Surgeon: Ricki Rodriguez, MD;  Location: [facility] ENDOSCOPY;  Service: Cardiovascular;  Laterality: N/A;  . Cardioversion 01/17/2011    Procedure: CARDIOVERSION;  Surgeon: Ricki Rodriguez, MD;  Location: [facility] ENDOSCOPY;  Service: Cardiovascular;  Laterality: N/A;  . Cardiac catheterization   . Insert / replace / remove pacemaker     Family History  Problem Relation Age of Onset  . Heart disease    . Heart failure    . Stroke    . Anesthesia problems Neg Hx   . Hypotension Neg Hx   . Malignant hyperthermia Neg Hx   . Pseudochol deficiency Neg Hx    Social History:  reports that he has never smoked. He does not have any smokeless tobacco history on file. He reports that he does not drink alcohol or use illicit drugs.  Allergies: No Known Allergies   (Not in a hospital admission)  Results for  orders placed during the hospital encounter of 08/18/11 (from the past 48 hour(s))  PROTIME-INR     Status: Abnormal   Collection Time   08/18/11  6:18 PM      Component Value Range Comment   Prothrombin Time 17.4 (*) 11.6 - 15.2 seconds    INR 1.40  0.00 - 1.49   CBC WITH DIFFERENTIAL     Status: Abnormal   Collection Time   08/18/11  9:13 PM      Component Value Range Comment   WBC 4.8  4.0 - 10.5 K/uL    RBC 4.59  4.22 - 5.81 MIL/uL    Hemoglobin 14.3  13.0 - 17.0 g/dL    HCT 16.1  09.6 - 04.5 %    MCV 90.4  78.0 - 100.0 fL    MCH 31.2  26.0 - 34.0 pg    MCHC 34.5  30.0 - 36.0 g/dL    RDW 40.9  81.1 - 91.4 %    Platelets 243  150 - 400 K/uL    Neutrophils Relative 35 (*) 43 - 77 %    Neutro Abs 1.7  1.7 - 7.7 K/uL    Lymphocytes Relative 44  12 - 46 %    Lymphs Abs 2.1  0.7 - 4.0  K/uL    Monocytes Relative 12  3 - 12 %    Monocytes Absolute 0.6  0.1 - 1.0 K/uL    Eosinophils Relative 8 (*) 0 - 5 %    Eosinophils Absolute 0.4  0.0 - 0.7 K/uL    Basophils Relative 1  0 - 1 %    Basophils Absolute 0.0  0.0 - 0.1 K/uL   TROPONIN I     Status: Normal   Collection Time   08/18/11  9:14 PM      Component Value Range Comment   Troponin I <0.30  <0.30 ng/mL   POCT I-STAT, CHEM 8     Status: Abnormal   Collection Time   08/18/11  9:56 PM      Component Value Range Comment   Sodium 140  135 - 145 mEq/L    Potassium 3.5  3.5 - 5.1 mEq/L    Chloride 106  96 - 112 mEq/L    BUN 53 (*) 6 - 23 mg/dL    Creatinine, Ser 0.45 (*) 0.50 - 1.35 mg/dL    Glucose, Bld 409 (*) 70 - 99 mg/dL    Calcium, Ion 8.11  9.14 - 1.30 mmol/L    TCO2 25  0 - 100 mmol/L    Hemoglobin 15.0  13.0 - 17.0 g/dL    HCT 78.2  95.6 - 21.3 %    Dg Chest Portable 1 View  08/18/2011  *RADIOLOGY REPORT*  Clinical Data: Chest pain and shortness of breath.  History of hypertension, CHF.  PORTABLE CHEST - 1 VIEW  Comparison: 07/31/2011  Findings: Patient has left-sided AICD, lead to the right ventricle. Heart is  enlarged.  There are no focal consolidations or pleural effusions.  No overt edema. Visualized osseous structures have a normal appearance.  IMPRESSION:  1.  Cardiomegaly. 2.  No overt edema.  Original Report Authenticated By: Patterson Hammersmith, M.D.    @ROS @ Constitutional: Negative for fever and chills.  HENT: Negative for hearing loss.  Eyes: Negative for blurred vision and double vision.  Respiratory: Positive for shortness of breath. Negative for cough, hemoptysis and sputum production.  Cardiovascular: Positive for chest pain, orthopnea, leg swelling and PND. Negative for palpitations and claudication.  Gastrointestinal: Negative for nausea, vomiting and abdominal pain.  Neurological: Negative for dizziness and headaches.   Blood pressure 118/76, pulse 49, temperature 97.8 F (36.6 C), temperature source Oral, resp. rate 23, SpO2 98.00%. Constitutional: He appears well-developed and well-nourished.  HENT: Normocephalic, atraumatic, Brown eyes, conjunctivae are normal. No scleral icterus.  Neck: Normal range of motion. Neck supple. JVD present. No tracheal deviation present. No thyromegaly present.  Cardiovascular: Irregularly irregular S1 and S2, soft systolic murmur.  Respiratory: Decreased breath sound at bases.  GI: Soft. Bowel sounds are normal. There is no tenderness. There is no rebound and no guarding.  EXT:No clubbing cyanosis Trace edema noted  Neurological: He is alert and oriented to person, place, and time.    Assessment/Plan Chest pain Cervical disc disease Dilated cardiomyopathy Hypertension Sarcoidosis S/P ICD  R/O MI,  Start IV heparin Home medications but hold warfarin  [name] S 08/18/2011, 10:25 PM

## 2011-08-18 NOTE — ED Notes (Signed)
Pt due for surgery and started bleeding today and his INR is off was told to come here to be admitted for this.

## 2011-08-18 NOTE — Progress Notes (Signed)
Pt failed to take Coreg.  Dr Sampson Goon called..instructed to not give Coreg.

## 2011-08-18 NOTE — Preoperative (Addendum)
Beta Blockers   Reason not to administer Beta Blockers:Not Applicable, last dose 08/21/11 at 0930

## 2011-08-18 NOTE — Anesthesia Preprocedure Evaluation (Addendum)
Anesthesia Evaluation  Patient identified by MRN, date of birth, ID band Patient awake    Reviewed: Allergy & Precautions, H&P , NPO status , Patient's Chart, lab work & pertinent test results, reviewed documented beta blocker date and time   Airway Mallampati: I TM Distance: >3 FB Neck ROM: Full    Dental  (+) Teeth Intact and Dental Advisory Given   Pulmonary shortness of breath and with exertion,  breath sounds clear to auscultation        Cardiovascular hypertension, Pt. on medications and Pt. on home beta blockers + angina with exertion + CAD, + Past MI and +CHF + dysrhythmias Atrial Fibrillation + pacemaker + Cardiac Defibrillator Rhythm:Regular Rate:Normal  Needs disabling of AICD before OR   Neuro/Psych negative neurological ROS  negative psych ROS   GI/Hepatic negative GI ROS, Neg liver ROS, GERD-  Medicated and Controlled,  Endo/Other  negative endocrine ROS  Renal/GU Renal InsufficiencyRenal disease  negative genitourinary   Musculoskeletal   Abdominal   Peds  Hematology negative hematology ROS (+)   Anesthesia Other Findings See surgeon's H&P   Reproductive/Obstetrics negative OB ROS                          Anesthesia Physical Anesthesia Plan  ASA: IV  Anesthesia Plan: General   Post-op Pain Management:    Induction: Intravenous  Airway Management Planned: Oral ETT  Additional Equipment: Arterial line  Intra-op Plan:   Post-operative Plan: Extubation in OR  Informed Consent: I have reviewed the patients History and Physical, chart, labs and discussed the procedure including the risks, benefits and alternatives for the proposed anesthesia with the patient or authorized representative who has indicated his/her understanding and acceptance.   Dental advisory given  Plan Discussed with: CRNA, Anesthesiologist and Surgeon  Anesthesia Plan Comments:        Anesthesia  Quick Evaluation

## 2011-08-18 NOTE — Transfer of Care (Signed)
Immediate Anesthesia Transfer of Care Note  Case Cancelled 

## 2011-08-18 NOTE — Progress Notes (Signed)
SPOKE WITH BRIAN SMALL ADVISED OF PTS ARRIVAL AND SURGERY  SCHEDULED AT 0730.  THE REP WILL BE IN OR HOLDING.

## 2011-08-18 NOTE — Anesthesia Postprocedure Evaluation (Signed)
Case Cancelled

## 2011-08-18 NOTE — Progress Notes (Signed)
Patient ID: [name], male   DOB: 12-27-1950, 61 y.o.   MRN: [id number] INR at 1.5 .   Patient had stopped coumadin 2 days ago and not the five days as requested. AM labs returned and INR is 1.5 .     This would put him at increased risk for cervical epidural hematoma and possible cord compression with neurologic deficit.   This elective procedure will be rescheduled next Monday if possible.  Discussed with patient , he understands and agrees. Discussed with his family.

## 2011-08-18 NOTE — Progress Notes (Signed)
Paged [employer] rep Kerry Fort, notified pt was rescheduled for Monday at 1130.

## 2011-08-18 NOTE — ED Provider Notes (Signed)
History     CSN: [id number]  Arrival date & time 08/18/11  1742   First MD Initiated Contact with Patient 08/18/11 2106      Chief Complaint  Patient presents with  . Coagulation Disorder    (Consider location/radiation/quality/duration/timing/severity/associated sxs/prior treatment) Patient is a 61 y.o. male presenting with chest pain. The history is provided by the patient.  Chest Pain The chest pain began 3 - 5 hours ago. Chest pain occurs constantly. The chest pain is improving. The pain is currently at 6/10. The severity of the pain is moderate. The quality of the pain is described as similar to previous episodes and heavy. The pain does not radiate. Pertinent negatives for primary symptoms include no fever, no shortness of breath, no cough, no abdominal pain, no nausea and no vomiting. He tried nothing for the symptoms.     Past Medical History  Diagnosis Date  . CHF (congestive heart failure)   . Sarcoidosis   . Cardiomyopathy     non ischemic by cath  . Acute on chronic systolic heart failure   . Automatic implantable cardiac defibrillator in situ   . Atrial fibrillation   . NSVT (nonsustained ventricular tachycardia)   . GERD (gastroesophageal reflux disease)   . Hypercholesteremia   . Coronary artery disease   . Myocardial infarction   . Shortness of breath   . Chronic kidney disease (CKD), stage III (moderate)   . Pacemaker   . Anginal pain   . Gout   . Hypertension     [name]    Past Surgical History  Procedure Date  . Back surgery 1987    Ruptured disk repair  . Pacemaker insertion     with ICD  . Tee without cardioversion 01/17/2011    Procedure: TRANSESOPHAGEAL ECHOCARDIOGRAM (TEE);  Surgeon: Ricki Rodriguez, MD;  Location: [facility] ENDOSCOPY;  Service: Cardiovascular;  Laterality: N/A;  . Cardioversion 01/17/2011    Procedure: CARDIOVERSION;  Surgeon: Ricki Rodriguez, MD;  Location: [facility] ENDOSCOPY;  Service: Cardiovascular;  Laterality: N/A;  . Cardiac  catheterization   . Insert / replace / remove pacemaker     Family History  Problem Relation Age of Onset  . Heart disease    . Heart failure    . Stroke    . Anesthesia problems Neg Hx   . Hypotension Neg Hx   . Malignant hyperthermia Neg Hx   . Pseudochol deficiency Neg Hx     History  Substance Use Topics  . Smoking status: Never Smoker   . Smokeless tobacco: Not on file  . Alcohol Use: No      Review of Systems  Constitutional: Negative for fever and chills.  HENT: Negative for neck pain.   Respiratory: Negative for cough and shortness of breath.   Cardiovascular: Positive for chest pain. Negative for leg swelling.  Gastrointestinal: Negative for nausea, vomiting, abdominal pain and blood in stool.  Genitourinary: Negative.   Musculoskeletal: Negative.   Skin: Negative.   Neurological: Negative for headaches.  Psychiatric/Behavioral: Negative.   All other systems reviewed and are negative.    Allergies  Review of patient's allergies indicates no known allergies.  Home Medications   Current Outpatient Rx  Name Route Sig Dispense Refill  . ALLOPURINOL 100 MG PO TABS Oral Take 100 mg by mouth daily.    . AMIODARONE HCL 200 MG PO TABS Oral Take 200 mg by mouth daily.    . ASPIRIN EC 81 MG PO TBEC Oral  Take 81 mg by mouth every other day.     Marland Kitchen CARVEDILOL 3.125 MG PO TABS Oral Take 3.125 mg by mouth 2 (two) times daily with a meal.    . COLCHICINE 0.6 MG PO TABS Oral Take 0.6 mg by mouth daily.    Marland Kitchen DIGOXIN 0.25 MG PO TABS Oral Take 0.25 mg by mouth daily.    . FUROSEMIDE 80 MG PO TABS Oral Take 80 mg by mouth 2 (two) times daily.    . ISOSORB DINITRATE-HYDRALAZINE 20-37.5 MG PO TABS Oral Take 1 tablet by mouth 3 (three) times daily.    Marland Kitchen METOLAZONE 2.5 MG PO TABS Oral Take 2.5 mg by mouth daily.    Marland Kitchen PANTOPRAZOLE SODIUM 40 MG PO TBEC Oral Take 40 mg by mouth daily at 12 noon.    Marland Kitchen POTASSIUM CHLORIDE CRYS ER 20 MEQ PO TBCR Oral Take 20 mEq by mouth 2 (two) times  daily.    Marland Kitchen ROSUVASTATIN CALCIUM 5 MG PO TABS Oral Take 5 mg by mouth at bedtime.    . SUCRALFATE 1 G PO TABS Oral Take 1 g by mouth 4 (four) times daily.    . WARFARIN SODIUM 6 MG PO TABS Oral Take 9 mg by mouth at bedtime.       BP 117/81  Pulse 45  Temp 97.8 F (36.6 C) (Oral)  Resp 15  SpO2 99%  Physical Exam  Nursing note and vitals reviewed. Constitutional: He is oriented to person, place, and time. He appears well-developed and well-nourished.  HENT:  Head: Normocephalic and atraumatic.  Right Ear: External ear normal.  Left Ear: External ear normal.  Nose: Nose normal.  Eyes: Right eye exhibits no discharge. Left eye exhibits no discharge.  Neck: Neck supple.  Cardiovascular: Normal rate, regular rhythm, normal heart sounds and intact distal pulses.   Pulmonary/Chest: Effort normal and breath sounds normal. He has no wheezes. He has no rales. He exhibits no tenderness.    Abdominal: Soft. He exhibits no distension and no mass. There is no tenderness. There is no rebound and no guarding.  Musculoskeletal: He exhibits no edema.  Neurological: He is alert and oriented to person, place, and time.  Skin: Skin is warm and dry.    ED Course  Procedures (including critical care time)  Labs Reviewed  PROTIME-INR - Abnormal; Notable for the following:    Prothrombin Time 17.4 (*)     All other components within normal limits  CBC WITH DIFFERENTIAL - Abnormal; Notable for the following:    Neutrophils Relative 35 (*)     Eosinophils Relative 8 (*)     All other components within normal limits  POCT I-STAT, CHEM 8 - Abnormal; Notable for the following:    BUN 53 (*)     Creatinine, Ser 2.50 (*)     Glucose, Bld 104 (*)     All other components within normal limits  TROPONIN I   Dg Chest Portable 1 View  08/18/2011  *RADIOLOGY REPORT*  Clinical Data: Chest pain and shortness of breath.  History of hypertension, CHF.  PORTABLE CHEST - 1 VIEW  Comparison: 07/31/2011   Findings: Patient has left-sided AICD, lead to the right ventricle. Heart is enlarged.  There are no focal consolidations or pleural effusions.  No overt edema. Visualized osseous structures have a normal appearance.  IMPRESSION:  1.  Cardiomegaly. 2.  No overt edema.  Original Report Authenticated By: Patterson Hammersmith, M.D.     Date: 08/18/2011  Rate: 61  Rhythm: paced rhythm  QRS Axis: left  Intervals: QT prolonged  ST/T Wave abnormalities: nonspecific ST/T changes  Conduction Disutrbances:none  Narrative Interpretation:   Old EKG Reviewed: changes noted   1. Chest pain       MDM  61 yo male who was scheduled for neck surgery this AM but INR was still 1.5 who's told to come in by his cardiologist to bridge over the weekend with heparin. Now also having chest pressure over past 3 hours. Pain improved with morphine. Discussed with cardiology, who will place patient on heparin and admit to hospital. Doubt ACS at this time with his chest pain without other symptoms, but will likely need rule out while in the hospital prior to his surgery.        Pricilla Loveless, MD 08/18/11 [phone]

## 2011-08-18 NOTE — Progress Notes (Signed)
ANTICOAGULATION CONSULT NOTE - Initial Consult  Pharmacy Consult for heparin Indication: chest pain/ACS and atrial fibrillation  No Known Allergies  Vital Signs: Temp: 97.8 F (36.6 C) (08/16 2211) Temp src: Oral (08/16 2211) BP: 118/76 mmHg (08/16 2211) Pulse Rate: 49  (08/16 2211)  Labs:  Basename 08/18/11 2156 08/18/11 2114 08/18/11 2113 08/18/11 1818 08/18/11 0641 08/17/11 1415  HGB 15.0 -- 14.3 -- -- --  HCT 44.0 -- 41.5 -- -- 41.7  PLT -- -- 243 -- -- 234  APTT -- -- -- -- 31 35  LABPROT -- -- -- 17.4* 18.4* 19.0*  INR -- -- -- 1.40 1.50* 1.56*  HEPARINUNFRC -- -- -- -- -- --  CREATININE 2.50* -- -- -- 2.52* 2.50*  CKTOTAL -- -- -- -- -- --  CKMB -- -- -- -- -- --  TROPONINI -- <0.30 -- -- -- --    The CrCl is unknown because both a height and weight (above a minimum accepted value) are required for this calculation.   Medical History: Past Medical History  Diagnosis Date  . CHF (congestive heart failure)   . Sarcoidosis   . Cardiomyopathy     non ischemic by cath  . Acute on chronic systolic heart failure   . Automatic implantable cardiac defibrillator in situ   . Atrial fibrillation   . NSVT (nonsustained ventricular tachycardia)   . GERD (gastroesophageal reflux disease)   . Hypercholesteremia   . Coronary artery disease   . Myocardial infarction   . Shortness of breath   . Chronic kidney disease (CKD), stage III (moderate)   . Pacemaker   . Anginal pain   . Gout   . Hypertension     [name]   Assessment: 44 yom on chronic coumadin for history of afib. Coumadin has been on hold for pending surgery on Monday. INR today is 1.4. Presented to the ED with CP to start heparin for CP while coumadin is on hold. CBC is WNL and no bleeding noted.   Goal of Therapy:  Heparin level 0.3-0.7 units/ml Monitor platelets by anticoagulation protocol: Yes   Plan:  1. Heparin bolus 4000 units/hr 2. Heparin gtt 1000 units/hr 3. Check a 6 hour heparin  level 4. Daily heparin level and CBC  Jaaliyah Lucatero, Drake Leach 08/18/2011,11:08 PM

## 2011-08-18 NOTE — Progress Notes (Signed)
Pt ptt drawn 0650 today

## 2011-08-19 ENCOUNTER — Other Ambulatory Visit: Payer: Self-pay

## 2011-08-19 ENCOUNTER — Encounter (HOSPITAL_COMMUNITY): Payer: Self-pay | Admitting: Nurse Practitioner

## 2011-08-19 LAB — CARDIAC PANEL(CRET KIN+CKTOT+MB+TROPI)
CK, MB: 3.6 ng/mL (ref 0.3–4.0)
Relative Index: 2.2 (ref 0.0–2.5)
Relative Index: 2.3 (ref 0.0–2.5)
Relative Index: 2.5 (ref 0.0–2.5)
Total CK: 183 U/L (ref 7–232)
Troponin I: <0.3 ng/mL (ref <0.30)
Troponin I: <0.3 ng/mL (ref <0.30)

## 2011-08-19 LAB — CBC WITH DIFFERENTIAL/PLATELET
Basophils Absolute: 0 10*3/uL (ref 0.0–0.1)
Basophils Relative: 0 % (ref 0–1)
Eosinophils Absolute: 0.4 10*3/uL (ref 0.0–0.7)
HCT: 45 % (ref 39.0–52.0)
Hemoglobin: 15.3 g/dL (ref 13.0–17.0)
MCH: 30.5 pg (ref 26.0–34.0)
MCHC: 34 g/dL (ref 30.0–36.0)
Monocytes Absolute: 0.6 10*3/uL (ref 0.1–1.0)
Monocytes Relative: 11 % (ref 3–12)
Neutro Abs: 1.9 10*3/uL (ref 1.7–7.7)
RDW: 13.5 % (ref 11.5–15.5)

## 2011-08-19 LAB — LIPID PANEL
HDL: 87 mg/dL (ref >39)
LDL Cholesterol: 74 mg/dL (ref 0–99)

## 2011-08-19 LAB — HEPARIN LEVEL (UNFRACTIONATED)
Heparin Unfractionated: 0.43 IU/mL (ref 0.30–0.70)
Heparin Unfractionated: 0.39 IU/mL (ref 0.30–0.70)

## 2011-08-19 LAB — BASIC METABOLIC PANEL
BUN: 49 mg/dL — ABNORMAL HIGH (ref 6–23)
Chloride: 99 mEq/L (ref 96–112)
GFR calc Af Amer: 31 mL/min — ABNORMAL LOW (ref >90)
GFR calc non Af Amer: 27 mL/min — ABNORMAL LOW (ref >90)
Potassium: 3.3 mEq/L — ABNORMAL LOW (ref 3.5–5.1)
Sodium: 141 mEq/L (ref 135–145)

## 2011-08-19 LAB — CBC
MCV: 89.6 fL (ref 78.0–100.0)
Platelets: 220 10*3/uL (ref 150–400)
RDW: 13.5 % (ref 11.5–15.5)
WBC: 5.5 10*3/uL (ref 4.0–10.5)

## 2011-08-19 LAB — PROTIME-INR
INR: 1.53 — ABNORMAL HIGH (ref 0.00–1.49)
INR: 1.5 — ABNORMAL HIGH (ref 0.00–1.49)
Prothrombin Time: 18.4 seconds — ABNORMAL HIGH (ref 11.6–15.2)

## 2011-08-19 LAB — APTT: aPTT: 161 seconds — ABNORMAL HIGH (ref 24–37)

## 2011-08-19 MED ORDER — AMOXICILLIN 500 MG PO CAPS
500.0000 mg | ORAL_CAPSULE | Freq: Three times a day (TID) | ORAL | Status: DC
  Administered 2011-08-19 – 2011-08-23 (×12): 500 mg via ORAL
  Filled 2011-08-19 (×17): qty 1

## 2011-08-19 MED ORDER — ATORVASTATIN CALCIUM 10 MG PO TABS
10.0000 mg | ORAL_TABLET | ORAL | Status: AC
  Administered 2011-08-19: 10 mg via ORAL
  Filled 2011-08-19: qty 1

## 2011-08-19 MED ORDER — NITROGLYCERIN 0.4 MG SL SUBL: 0.4000 mg | SUBLINGUAL_TABLET | SUBLINGUAL | Status: DC | PRN

## 2011-08-19 MED ORDER — ONDANSETRON HCL 4 MG/2ML IJ SOLN
4.0000 mg | Freq: Four times a day (QID) | INTRAMUSCULAR | Status: DC | PRN
  Administered 2011-08-19 – 2011-08-21 (×5): 4 mg via INTRAVENOUS
  Filled 2011-08-19 (×4): qty 2

## 2011-08-19 MED ORDER — SODIUM CHLORIDE 0.9 % IJ SOLN: 3.0000 mL | INTRAMUSCULAR | Status: DC | PRN

## 2011-08-19 MED ORDER — SODIUM CHLORIDE 0.9 % IJ SOLN
3.0000 mL | Freq: Two times a day (BID) | INTRAMUSCULAR | Status: DC
  Administered 2011-08-19 – 2011-08-21 (×3): 3 mL via INTRAVENOUS

## 2011-08-19 MED ORDER — METOPROLOL TARTRATE 12.5 MG HALF TABLET
12.5000 mg | ORAL_TABLET | Freq: Two times a day (BID) | ORAL | Status: DC
  Administered 2011-08-19 – 2011-08-21 (×5): 12.5 mg via ORAL
  Filled 2011-08-19 (×7): qty 1

## 2011-08-19 MED ORDER — ACETAMINOPHEN 325 MG PO TABS: 650.0000 mg | ORAL_TABLET | ORAL | Status: DC | PRN

## 2011-08-19 MED ORDER — PHYTONADIONE 5 MG PO TABS
2.5000 mg | ORAL_TABLET | Freq: Once | ORAL | Status: AC
  Administered 2011-08-19: 11:00:00 via ORAL
  Filled 2011-08-19: qty 1

## 2011-08-19 MED ORDER — ALPRAZOLAM 0.25 MG PO TABS
0.2500 mg | ORAL_TABLET | Freq: Two times a day (BID) | ORAL | Status: DC | PRN
  Administered 2011-08-23: 0.25 mg via ORAL
  Filled 2011-08-19: qty 1

## 2011-08-19 MED ORDER — SODIUM CHLORIDE 0.9 % IV SOLN: 250.0000 mL | INTRAVENOUS | Status: DC | PRN

## 2011-08-19 MED ORDER — POTASSIUM CHLORIDE CRYS ER 20 MEQ PO TBCR
40.0000 meq | EXTENDED_RELEASE_TABLET | Freq: Once | ORAL | Status: AC
  Administered 2011-08-19: 40 meq via ORAL

## 2011-08-19 MED ORDER — ZOLPIDEM TARTRATE 5 MG PO TABS
5.0000 mg | ORAL_TABLET | Freq: Every evening | ORAL | Status: DC | PRN
  Administered 2011-08-20 – 2011-08-22 (×3): 5 mg via ORAL
  Filled 2011-08-19 (×4): qty 1

## 2011-08-19 MED ORDER — ATORVASTATIN CALCIUM 10 MG PO TABS
10.0000 mg | ORAL_TABLET | Freq: Every day | ORAL | Status: DC
  Administered 2011-08-19 – 2011-08-22 (×4): 10 mg via ORAL
  Filled 2011-08-19 (×7): qty 1

## 2011-08-19 NOTE — Progress Notes (Signed)
ANTICOAGULATION CONSULT NOTE - Follow Up Consult  Pharmacy Consult for Heparin Indication: chest pain/ACS, atrial fibrillation  No Known Allergies  Vital Signs: Temp: 98.2 F (36.8 C) (08/17 0434) Temp src: Oral (08/17 0434) BP: 93/66 mmHg (08/17 0434) Pulse Rate: 49  (08/17 0434)  Labs:  Basename 08/19/11 1216 08/19/11 0500 08/19/11 0129 08/18/11 2156 08/18/11 2113 08/18/11 1818 08/18/11 0641 08/17/11 1415  HGB -- 15.1 15.3 -- -- -- -- --  HCT -- 43.8 45.0 44.0 -- -- -- --  PLT -- 220 221 -- 243 -- -- --  APTT -- -- 161* -- -- -- 31 35  LABPROT -- 18.4* 18.7* -- -- 17.4* -- --  INR -- 1.50* 1.53* -- -- 1.40 -- --  HEPARINUNFRC 0.39 0.43 -- -- -- -- -- --  CREATININE -- 2.43* -- 2.50* -- -- 2.52* --  CKTOTAL 143 164 183 -- -- -- -- --  CKMB 3.6 3.8 4.0 -- -- -- -- --  TROPONINI <0.30 <0.30 <0.30 -- -- -- -- --    Estimated Creatinine Clearance: 38.2 ml/min (by C-G formula based on Cr of 2.43).   Medications:  Heparin @ 1000 units/hr  Assessment: 61yom on coumadin pta for afib, now on heparin gtt while coumadin on hold awaiting spinal surgery on 8/19. Confirmatory heparin level is therapeutic. INR down to 1.5 and vitamin k 2.5mg  po x 1 ordered for today.  Patient also with CP but enzymes negative thus far. CBC is stable. No bleeding noted.  Goal of Therapy:  Heparin level 0.3-0.7 units/ml Monitor platelets by anticoagulation protocol: Yes   Plan:  1) Continue heparin at 1000 units/hr 2) Follow up heparin level and CBC in AM  Fredrik Rigger 08/19/2011,1:26 PM

## 2011-08-19 NOTE — Progress Notes (Signed)
ANTICOAGULATION CONSULT NOTE - Initial Consult  Pharmacy Consult for heparin Indication: chest pain/ACS and atrial fibrillation  No Known Allergies  Vital Signs: Temp: 98.2 F (36.8 C) (08/17 0434) Temp src: Oral (08/17 0434) BP: 93/66 mmHg (08/17 0434) Pulse Rate: 49  (08/17 0434)  Labs:  Basename 08/19/11 0500 08/19/11 0129 08/18/11 2156 08/18/11 2114 08/18/11 2113 08/18/11 1818 08/18/11 0641 08/17/11 1415  HGB 15.1 15.3 -- -- -- -- -- --  HCT 43.8 45.0 44.0 -- -- -- -- --  PLT 220 221 -- -- 243 -- -- --  APTT -- 161* -- -- -- -- 31 35  LABPROT -- 18.7* -- -- -- 17.4* 18.4* --  INR -- 1.53* -- -- -- 1.40 1.50* --  HEPARINUNFRC 0.43 -- -- -- -- -- -- --  CREATININE -- -- 2.50* -- -- -- 2.52* 2.50*  CKTOTAL -- 183 -- -- -- -- -- --  CKMB -- 4.0 -- -- -- -- -- --  TROPONINI -- <0.30 -- <0.30 -- -- -- --    Estimated Creatinine Clearance: 37.1 ml/min (by C-G formula based on Cr of 2.5).  Assessment: 61 yom on chronic coumadin for history of afib. Coumadin has been on hold for pending surgery on Monday. INR today is 1.53. Presented to the ED with CP to start heparin for CP while coumadin is on hold. CBC is WNL and no bleeding noted. Initial heparin level is at goal at 0.43.   Goal of Therapy:  Heparin level 0.3-0.7 units/ml Monitor platelets by anticoagulation protocol: Yes   Plan:  1. Continue heparin gtt at 1000 units/hr 2. Check a 6 hour heparin level to confirm  Barbarajean Kinzler, Drake Leach 08/19/2011,6:47 AM

## 2011-08-19 NOTE — Progress Notes (Signed)
Subjective:  Little chest pain. Wants sleeping medication. Some tooth pain with sinus drainage.  Objective:  Vital Signs in the last 24 hours: Temp:  [97.8 F (36.6 C)-98.2 F (36.8 C)] 98.2 F (36.8 C) (08/17 0434) Pulse Rate:  [45-73] 49  (08/17 0434) Cardiac Rhythm:  [-] Atrial fibrillation (08/17 0845) Resp:  [15-23] 18  (08/17 0434) BP: (93-136)/(63-81) 93/66 mmHg (08/17 0434) SpO2:  [98 %-99 %] 98 % (08/17 0434) Weight:  [100.381 kg (221 lb 4.8 oz)] 100.381 kg (221 lb 4.8 oz) (08/17 0041)  Physical Exam: BP Readings from Last 1 Encounters:  08/19/11 93/66    Wt Readings from Last 1 Encounters:  08/19/11 100.381 kg (221 lb 4.8 oz)    Weight change:   HEENT: [place]/AT, Eyes-Brown, PERL, EOMI, Conjunctiva-Pink, Sclera-Non-icteric Neck: No JVD, No bruit, Trachea midline. Lungs:  Clear, Bilateral. Cardiac:  Regular rhythm, normal S1 and S2, no S3.  Abdomen:  Soft, non-tender. Extremities:  No edema present. No cyanosis. No clubbing. CNS: AxOx3, Cranial nerves grossly intact, moves all 4 extremities. Right handed. Skin: Warm and dry.   Intake/Output from previous day: 08/16 0701 - 08/17 0700 In: 502 [P.O.:360; I.V.:140; IV Piggyback:2] Out: 300 [Urine:300]    Lab Results: BMET    Component Value Date/Time   NA 141 08/19/2011 0500   K 3.3* 08/19/2011 0500   CL 99 08/19/2011 0500   CO2 28 08/19/2011 0500   GLUCOSE 103* 08/19/2011 0500   BUN 49* 08/19/2011 0500   CREATININE 2.43* 08/19/2011 0500   CALCIUM 10.2 08/19/2011 0500   GFRNONAA 27* 08/19/2011 0500   GFRAA 31* 08/19/2011 0500   CBC    Component Value Date/Time   WBC 5.5 08/19/2011 0500   RBC 4.89 08/19/2011 0500   HGB 15.1 08/19/2011 0500   HCT 43.8 08/19/2011 0500   PLT 220 08/19/2011 0500   MCV 89.6 08/19/2011 0500   MCH 30.9 08/19/2011 0500   MCHC 34.5 08/19/2011 0500   RDW 13.5 08/19/2011 0500   LYMPHSABS 2.6 08/19/2011 0129   MONOABS 0.6 08/19/2011 0129   EOSABS 0.4 08/19/2011 0129   BASOSABS 0.0 08/19/2011 0129    CARDIAC ENZYMES Lab Results  Component Value Date   CKTOTAL 164 08/19/2011   CKMB 3.8 08/19/2011   TROPONINI <0.30 08/19/2011    Assessment/Plan:  Patient Active Hospital Problem List:  Chest pain  Cervical disc disease  Dilated cardiomyopathy  Hypertension  Sarcoidosis  S/P ICD Sleep disturbance Sinusitis/toothache  Add ambien and amoxicillin   LOS: 1 day    Orpah Cobb  MD  08/19/2011, 9:31 AM

## 2011-08-19 NOTE — ED Notes (Signed)
Nurse on 4700 given report. Patient transferred without problem. Alert, oriented and eager to get to inpatient room. Understands plan of care. No further problems identified.

## 2011-08-20 LAB — BASIC METABOLIC PANEL
Calcium: 9.7 mg/dL (ref 8.4–10.5)
GFR calc Af Amer: 29 mL/min — ABNORMAL LOW (ref >90)
GFR calc non Af Amer: 25 mL/min — ABNORMAL LOW (ref >90)
Glucose, Bld: 102 mg/dL — ABNORMAL HIGH (ref 70–99)
Potassium: 3.5 mEq/L (ref 3.5–5.1)
Sodium: 138 mEq/L (ref 135–145)

## 2011-08-20 LAB — CBC
MCV: 90.3 fL (ref 78.0–100.0)
Platelets: 185 10*3/uL (ref 150–400)
RBC: 4.84 MIL/uL (ref 4.22–5.81)
RDW: 13.4 % (ref 11.5–15.5)
WBC: 6 10*3/uL (ref 4.0–10.5)

## 2011-08-20 LAB — HEPARIN LEVEL (UNFRACTIONATED): Heparin Unfractionated: 0.38 IU/mL (ref 0.30–0.70)

## 2011-08-20 LAB — PROTIME-INR: Prothrombin Time: 15.3 seconds — ABNORMAL HIGH (ref 11.6–15.2)

## 2011-08-20 MED ORDER — OXYCODONE HCL 5 MG PO TABS
5.0000 mg | ORAL_TABLET | Freq: Four times a day (QID) | ORAL | Status: DC | PRN
  Administered 2011-08-20 (×2): 5 mg via ORAL
  Filled 2011-08-20 (×2): qty 1

## 2011-08-20 NOTE — Progress Notes (Signed)
ANTICOAGULATION CONSULT NOTE - Follow Up Consult  Pharmacy Consult for Heparin Indication: chest pain/ACS, atrial fibrillation  No Known Allergies  Vital Signs: Temp: 98.2 F (36.8 C) (08/18 0652) Temp src: Oral (08/18 0652) BP: 102/66 mmHg (08/18 0652) Pulse Rate: 71  (08/18 0652)  Labs:  Basename 08/20/11 0510 08/19/11 1216 08/19/11 0500 08/19/11 0129 08/18/11 2156 08/18/11 0641 08/17/11 1415  HGB 14.9 -- 15.1 -- -- -- --  HCT 43.7 -- 43.8 45.0 -- -- --  PLT 185 -- 220 221 -- -- --  APTT -- -- -- 161* -- 31 35  LABPROT 15.3* -- 18.4* 18.7* -- -- --  INR 1.18 -- 1.50* 1.53* -- -- --  HEPARINUNFRC 0.38 0.39 0.43 -- -- -- --  CREATININE 2.60* -- 2.43* -- 2.50* -- --  CKTOTAL -- 143 164 183 -- -- --  CKMB -- 3.6 3.8 4.0 -- -- --  TROPONINI -- <0.30 <0.30 <0.30 -- -- --    Estimated Creatinine Clearance: 35.7 ml/min (by C-G formula based on Cr of 2.6).   Medications:  Heparin @ 1000 units/hr  Assessment: [name] on coumadin pta for afib, now on heparin gtt while coumadin on hold awaiting spinal surgery tomorrow. Heparin level is therapeutic. INR is now at baseline - vitamin k 2.5mg  po x 1 given yesterday. CBC stable. No bleeding noted.  Goal of Therapy:  Heparin level 0.3-0.7 units/ml Monitor platelets by anticoagulation protocol: Yes   Plan:  1) Continue heparin at 1000 units/hr 2) Follow up heparin level in AM 3) Follow up resuming coumadin after surgery  Fredrik Rigger 08/20/2011,10:17 AM

## 2011-08-20 NOTE — Progress Notes (Signed)
Subjective:  Feeling better. INR back to normal range. Awaiting neck surgery.  Objective:  Vital Signs in the last 24 hours: Temp:  [97 F (36.1 C)-98.2 F (36.8 C)] 98.2 F (36.8 C) (08/18 0652) Pulse Rate:  [66-74] 71  (08/18 0652) Cardiac Rhythm:  [-] Atrial fibrillation (08/18 0733) Resp:  [18-20] 20  (08/18 0652) BP: (95-107)/(62-71) 102/66 mmHg (08/18 0652) SpO2:  [95 %-100 %] 98 % (08/18 0652) Weight:  [99.7 kg (219 lb 12.8 oz)] 99.7 kg (219 lb 12.8 oz) (08/18 4259)  Physical Exam: BP Readings from Last 1 Encounters:  08/20/11 102/66    Wt Readings from Last 1 Encounters:  08/20/11 99.7 kg (219 lb 12.8 oz)    Weight change: -0.681 kg (-1 lb 8 oz)  HEENT: [place]/AT, Eyes-Brown, PERL, EOMI, Conjunctiva-Pink, Sclera-Non-icteric Neck: No JVD, No bruit, Trachea midline. Lungs:  Clear, Bilateral. Cardiac:  Regular rhythm, normal S1 and S2, no S3.  Abdomen:  Soft, non-tender. Extremities:  No edema present. No cyanosis. No clubbing. CNS: AxOx3, Cranial nerves grossly intact, moves all 4 extremities. Right handed. Skin: Warm and dry.   Intake/Output from previous day: 08/17 0701 - 08/18 0700 In: 830 [P.O.:730; I.V.:100] Out: 2400 [Urine:2400]    Lab Results: BMET    Component Value Date/Time   NA 138 08/20/2011 0510   K 3.5 08/20/2011 0510   CL 99 08/20/2011 0510   CO2 25 08/20/2011 0510   GLUCOSE 102* 08/20/2011 0510   BUN 50* 08/20/2011 0510   CREATININE 2.60* 08/20/2011 0510   CALCIUM 9.7 08/20/2011 0510   GFRNONAA 25* 08/20/2011 0510   GFRAA 29* 08/20/2011 0510   CBC    Component Value Date/Time   WBC 6.0 08/20/2011 0510   RBC 4.84 08/20/2011 0510   HGB 14.9 08/20/2011 0510   HCT 43.7 08/20/2011 0510   PLT 185 08/20/2011 0510   MCV 90.3 08/20/2011 0510   MCH 30.8 08/20/2011 0510   MCHC 34.1 08/20/2011 0510   RDW 13.4 08/20/2011 0510   LYMPHSABS 2.6 08/19/2011 0129   MONOABS 0.6 08/19/2011 0129   EOSABS 0.4 08/19/2011 0129   BASOSABS 0.0 08/19/2011 0129   CARDIAC  ENZYMES Lab Results  Component Value Date   CKTOTAL 143 08/19/2011   CKMB 3.6 08/19/2011   TROPONINI <0.30 08/19/2011    Assessment/Plan:  Patient Active Hospital Problem List: Chest pain   -MI ruled out Cervical disc disease -Surgery tomorrow.  Dilated cardiomyopathy  Hypertension  Sarcoidosis  S/P ICD  Sleep disturbance  Sinusitis/toothache     LOS: 2 days    Orpah Cobb  MD  08/20/2011, 9:30 AM

## 2011-08-21 ENCOUNTER — Inpatient Hospital Stay (HOSPITAL_COMMUNITY)
Admission: RE | Admit: 2011-08-21 | Payer: PRIVATE HEALTH INSURANCE | Source: Ambulatory Visit | Admitting: Orthopaedic Surgery

## 2011-08-21 ENCOUNTER — Encounter (HOSPITAL_COMMUNITY): Payer: Self-pay | Admitting: Vascular Surgery

## 2011-08-21 ENCOUNTER — Other Ambulatory Visit (HOSPITAL_COMMUNITY): Payer: Self-pay | Admitting: Orthopaedic Surgery

## 2011-08-21 ENCOUNTER — Encounter (HOSPITAL_COMMUNITY)
Admission: EM | Disposition: A | Discharge status: Home / Self Care | Payer: Self-pay | Source: Home / Self Care | Attending: Cardiovascular Disease

## 2011-08-21 ENCOUNTER — Inpatient Hospital Stay (HOSPITAL_COMMUNITY): Payer: PRIVATE HEALTH INSURANCE | Admitting: Vascular Surgery

## 2011-08-21 ENCOUNTER — Inpatient Hospital Stay (HOSPITAL_COMMUNITY): Payer: PRIVATE HEALTH INSURANCE

## 2011-08-21 HISTORY — PX: ANTERIOR CERVICAL DECOMP/DISCECTOMY FUSION: SHX1161

## 2011-08-21 LAB — CBC
MCH: 30.9 pg (ref 26.0–34.0)
MCV: 89 fL (ref 78.0–100.0)
Platelets: 211 10*3/uL (ref 150–400)
RBC: 4.98 MIL/uL (ref 4.22–5.81)
RDW: 13.2 % (ref 11.5–15.5)
WBC: 5.7 10*3/uL (ref 4.0–10.5)

## 2011-08-21 LAB — HEPARIN LEVEL (UNFRACTIONATED): Heparin Unfractionated: 0.44 [IU]/mL (ref 0.30–0.70)

## 2011-08-21 SURGERY — ANTERIOR CERVICAL DECOMPRESSION/DISCECTOMY FUSION 2 LEVELS
Anesthesia: General | Site: Neck | Wound class: Clean

## 2011-08-21 MED ORDER — SODIUM CHLORIDE 0.9 % IV SOLN
10.0000 mg | INTRAVENOUS | Status: DC | PRN
  Administered 2011-08-21: 20 ug/min via INTRAVENOUS

## 2011-08-21 MED ORDER — BUPIVACAINE-EPINEPHRINE 0.25% -1:200000 IJ SOLN
INTRAMUSCULAR | Status: DC | PRN
  Administered 2011-08-21: 7 mL

## 2011-08-21 MED ORDER — CEFAZOLIN SODIUM-DEXTROSE 2-3 GM-% IV SOLR
INTRAVENOUS | Status: DC | PRN
  Administered 2011-08-21: 2 g via INTRAVENOUS

## 2011-08-21 MED ORDER — ROCURONIUM BROMIDE 100 MG/10ML IV SOLN
INTRAVENOUS | Status: DC | PRN
  Administered 2011-08-21: 50 mg via INTRAVENOUS

## 2011-08-21 MED ORDER — METHOCARBAMOL 500 MG PO TABS
500.0000 mg | ORAL_TABLET | Freq: Four times a day (QID) | ORAL | Status: DC | PRN
  Administered 2011-08-23 (×2): 500 mg via ORAL
  Filled 2011-08-21 (×2): qty 1

## 2011-08-21 MED ORDER — BUPIVACAINE-EPINEPHRINE PF 0.25-1:200000 % IJ SOLN
INTRAMUSCULAR | Status: AC
  Filled 2011-08-21: qty 30

## 2011-08-21 MED ORDER — ISOSORB DINITRATE-HYDRALAZINE 20-37.5 MG PO TABS
1.0000 | ORAL_TABLET | Freq: Two times a day (BID) | ORAL | Status: DC
  Administered 2011-08-22 – 2011-08-23 (×3): 1 via ORAL
  Filled 2011-08-21 (×5): qty 1

## 2011-08-21 MED ORDER — HYDROCODONE-ACETAMINOPHEN 5-325 MG PO TABS
1.0000 | ORAL_TABLET | ORAL | Status: DC | PRN
  Administered 2011-08-22: 1 via ORAL
  Administered 2011-08-22 – 2011-08-23 (×3): 2 via ORAL
  Administered 2011-08-23: 1 via ORAL
  Filled 2011-08-21: qty 2
  Filled 2011-08-21 (×2): qty 1
  Filled 2011-08-21 (×2): qty 2

## 2011-08-21 MED ORDER — ONDANSETRON HCL 4 MG PO TABS
4.0000 mg | ORAL_TABLET | Freq: Four times a day (QID) | ORAL | Status: DC | PRN
  Administered 2011-08-22: 4 mg via ORAL
  Filled 2011-08-21: qty 1

## 2011-08-21 MED ORDER — NEOSTIGMINE METHYLSULFATE 1 MG/ML IJ SOLN
INTRAMUSCULAR | Status: DC | PRN
  Administered 2011-08-21: 3 mg via INTRAVENOUS

## 2011-08-21 MED ORDER — WARFARIN - PHARMACIST DOSING INPATIENT
Freq: Every day | Status: DC
  Administered 2011-08-23: 1

## 2011-08-21 MED ORDER — KCL IN DEXTROSE-NACL 20-5-0.45 MEQ/L-%-% IV SOLN
INTRAVENOUS | Status: AC
  Filled 2011-08-21: qty 1000

## 2011-08-21 MED ORDER — METOCLOPRAMIDE HCL 5 MG/ML IJ SOLN: 10.0000 mg | Freq: Once | INTRAMUSCULAR | Status: DC | PRN

## 2011-08-21 MED ORDER — MORPHINE SULFATE 2 MG/ML IJ SOLN
1.0000 mg | INTRAMUSCULAR | Status: DC | PRN
  Administered 2011-08-21 – 2011-08-23 (×7): 1 mg via INTRAVENOUS
  Filled 2011-08-21 (×7): qty 1

## 2011-08-21 MED ORDER — THROMBIN 20000 UNITS EX SOLR
CUTANEOUS | Status: DC | PRN
  Administered 2011-08-21: 14:00:00 via TOPICAL

## 2011-08-21 MED ORDER — SODIUM BICARBONATE 8.4 % IV SOLN
INTRAVENOUS | Status: DC | PRN
  Administered 2011-08-21 (×5): 10 meq via INTRAVENOUS

## 2011-08-21 MED ORDER — ONDANSETRON HCL 4 MG/2ML IJ SOLN
INTRAMUSCULAR | Status: DC | PRN
  Administered 2011-08-21: 4 mg via INTRAVENOUS

## 2011-08-21 MED ORDER — FENTANYL CITRATE 0.05 MG/ML IJ SOLN
INTRAMUSCULAR | Status: DC | PRN
  Administered 2011-08-21 (×2): 100 ug via INTRAVENOUS
  Administered 2011-08-21 (×4): 50 ug via INTRAVENOUS

## 2011-08-21 MED ORDER — PHENYLEPHRINE HCL 10 MG/ML IJ SOLN
INTRAMUSCULAR | Status: DC | PRN
  Administered 2011-08-21: 80 ug via INTRAVENOUS
  Administered 2011-08-21: 120 ug via INTRAVENOUS
  Administered 2011-08-21: 40 ug via INTRAVENOUS

## 2011-08-21 MED ORDER — SODIUM CHLORIDE 0.9 % IV SOLN
INTRAVENOUS | Status: DC
  Administered 2011-08-21 – 2011-08-22 (×3): via INTRAVENOUS

## 2011-08-21 MED ORDER — ONDANSETRON HCL 4 MG/2ML IJ SOLN: 4.0000 mg | Freq: Four times a day (QID) | INTRAMUSCULAR | Status: DC | PRN

## 2011-08-21 MED ORDER — METHOCARBAMOL 100 MG/ML IJ SOLN
500.0000 mg | Freq: Four times a day (QID) | INTRAVENOUS | Status: DC | PRN
  Filled 2011-08-21: qty 5

## 2011-08-21 MED ORDER — OXYCODONE-ACETAMINOPHEN 5-325 MG PO TABS: 1.0000 | ORAL_TABLET | ORAL | Status: DC | PRN

## 2011-08-21 MED ORDER — WARFARIN SODIUM 10 MG PO TABS
10.0000 mg | ORAL_TABLET | Freq: Once | ORAL | Status: AC
  Administered 2011-08-21: 10 mg via ORAL
  Filled 2011-08-21: qty 1

## 2011-08-21 MED ORDER — THROMBIN 20000 UNITS EX SOLR
CUTANEOUS | Status: AC
  Filled 2011-08-21: qty 20000

## 2011-08-21 MED ORDER — KCL IN DEXTROSE-NACL 20-5-0.45 MEQ/L-%-% IV SOLN
INTRAVENOUS | Status: DC
  Administered 2011-08-21: 18:00:00 via INTRAVENOUS
  Filled 2011-08-21 (×2): qty 1000

## 2011-08-21 MED ORDER — HYDROMORPHONE HCL PF 1 MG/ML IJ SOLN
0.2500 mg | INTRAMUSCULAR | Status: DC | PRN
  Administered 2011-08-21 (×2): 0.5 mg via INTRAVENOUS

## 2011-08-21 MED ORDER — MIDAZOLAM HCL 5 MG/5ML IJ SOLN
INTRAMUSCULAR | Status: DC | PRN
  Administered 2011-08-21: 2 mg via INTRAVENOUS

## 2011-08-21 MED ORDER — GLYCOPYRROLATE 0.2 MG/ML IJ SOLN
INTRAMUSCULAR | Status: DC | PRN
  Administered 2011-08-21: 0.4 mg via INTRAVENOUS
  Administered 2011-08-21 (×2): 0.2 mg via INTRAVENOUS

## 2011-08-21 MED ORDER — ETOMIDATE 2 MG/ML IV SOLN
INTRAVENOUS | Status: DC | PRN
  Administered 2011-08-21: 14 mg via INTRAVENOUS

## 2011-08-21 MED ORDER — OXYCODONE HCL 5 MG PO TABS: 5.0000 mg | ORAL_TABLET | Freq: Once | ORAL | Status: DC | PRN

## 2011-08-21 MED ORDER — OXYCODONE HCL 5 MG/5ML PO SOLN: 5.0000 mg | Freq: Once | ORAL | Status: DC | PRN

## 2011-08-21 MED ORDER — HYDROMORPHONE HCL PF 1 MG/ML IJ SOLN
INTRAMUSCULAR | Status: AC
  Filled 2011-08-21: qty 1

## 2011-08-21 MED ORDER — CEFAZOLIN SODIUM-DEXTROSE 2-3 GM-% IV SOLR
INTRAVENOUS | Status: AC
  Filled 2011-08-21: qty 50

## 2011-08-21 SURGICAL SUPPLY — 53 items
APL SKNCLS STERI-STRIP NONHPOA (GAUZE/BANDAGES/DRESSINGS) ×1
BENZOIN TINCTURE PRP APPL 2/3 (GAUZE/BANDAGES/DRESSINGS) ×3 IMPLANT
BLADE SURG ROTATE 9660 (MISCELLANEOUS) IMPLANT
BUR ROUND FLUTED 4 SOFT TCH (BURR) IMPLANT
CLOTH BEACON ORANGE TIMEOUT ST (SAFETY) ×2 IMPLANT
CLSR STERI-STRIP ANTIMIC 1/2X4 (GAUZE/BANDAGES/DRESSINGS) ×1 IMPLANT
COLLAR CERV LO CONTOUR FIRM DE (SOFTGOODS) ×2 IMPLANT
COMPOSITE CERV ALLOGRAFT 6MM (Orthopedic Implant) ×2 IMPLANT
CORDS BIPOLAR (ELECTRODE) ×2 IMPLANT
COVER SURGICAL LIGHT HANDLE (MISCELLANEOUS) ×2 IMPLANT
DRAPE C-ARM 42X72 X-RAY (DRAPES) ×2 IMPLANT
DRAPE MICROSCOPE LEICA (MISCELLANEOUS) ×2 IMPLANT
DRAPE PROXIMA HALF (DRAPES) ×2 IMPLANT
DRILL BIT VUELOCK 12MML (BIT) ×1 IMPLANT
DURAPREP 6ML APPLICATOR 50/CS (WOUND CARE) ×2 IMPLANT
ELECT COATED BLADE 2.86 ST (ELECTRODE) ×2 IMPLANT
ELECT REM PT RETURN 9FT ADLT (ELECTROSURGICAL) ×2
ELECTRODE REM PT RTRN 9FT ADLT (ELECTROSURGICAL) ×1 IMPLANT
EVACUATOR 1/8 PVC DRAIN (DRAIN) ×2 IMPLANT
GAUZE XEROFORM 1X8 LF (GAUZE/BANDAGES/DRESSINGS) ×2 IMPLANT
GLOVE BIOGEL PI IND STRL 7.5 (GLOVE) ×1 IMPLANT
GLOVE BIOGEL PI IND STRL 8 (GLOVE) ×1 IMPLANT
GLOVE BIOGEL PI INDICATOR 7.5 (GLOVE) ×1
GLOVE BIOGEL PI INDICATOR 8 (GLOVE) ×1
GLOVE ECLIPSE 7.0 STRL STRAW (GLOVE) ×2 IMPLANT
GLOVE ORTHO TXT STRL SZ7.5 (GLOVE) ×2 IMPLANT
GOWN PREVENTION PLUS LG XLONG (DISPOSABLE) ×1 IMPLANT
GOWN PREVENTION PLUS XLARGE (GOWN DISPOSABLE) ×2 IMPLANT
GOWN STRL NON-REIN LRG LVL3 (GOWN DISPOSABLE) ×4 IMPLANT
HEAD HALTER (SOFTGOODS) ×2 IMPLANT
HEMOSTAT SURGICEL 2X14 (HEMOSTASIS) IMPLANT
KIT BASIN OR (CUSTOM PROCEDURE TRAY) ×2 IMPLANT
KIT ROOM TURNOVER OR (KITS) ×2 IMPLANT
MANIFOLD NEPTUNE II (INSTRUMENTS) ×2 IMPLANT
NDL 25GX 5/8IN NON SAFETY (NEEDLE) ×1 IMPLANT
NEEDLE 25GX 5/8IN NON SAFETY (NEEDLE) ×2 IMPLANT
NS IRRIG 1000ML POUR BTL (IV SOLUTION) ×2 IMPLANT
PACK ORTHO CERVICAL (CUSTOM PROCEDURE TRAY) ×2 IMPLANT
PAD ARMBOARD 7.5X6 YLW CONV (MISCELLANEOUS) ×4 IMPLANT
PATTIES SURGICAL .5 X.5 (GAUZE/BANDAGES/DRESSINGS) IMPLANT
PLATE 32MM (Plate) ×1 IMPLANT
SCREW SELF TAP 4X14 (Screw) ×6 IMPLANT
SPONGE GAUZE 4X4 12PLY (GAUZE/BANDAGES/DRESSINGS) ×2 IMPLANT
STAPLER VISISTAT 35W (STAPLE) ×2 IMPLANT
STRIP CLOSURE SKIN 1/2X4 (GAUZE/BANDAGES/DRESSINGS) ×2 IMPLANT
SURGIFLO TRUKIT (HEMOSTASIS) IMPLANT
SUT VIC AB 3-0 X1 27 (SUTURE) ×2 IMPLANT
SUT VICRYL 4-0 PS2 18IN ABS (SUTURE) ×4 IMPLANT
SYR 30ML SLIP (SYRINGE) ×2 IMPLANT
TAPE CLOTH SURG 4X10 WHT LF (GAUZE/BANDAGES/DRESSINGS) ×1 IMPLANT
TOWEL OR 17X24 6PK STRL BLUE (TOWEL DISPOSABLE) ×2 IMPLANT
TOWEL OR 17X26 10 PK STRL BLUE (TOWEL DISPOSABLE) ×2 IMPLANT
WATER STERILE IRR 1000ML POUR (IV SOLUTION) ×2 IMPLANT

## 2011-08-21 NOTE — Anesthesia Postprocedure Evaluation (Signed)
  Anesthesia Post-op Note  Patient: [name]  Procedure(s) Performed: Procedure(s) (LRB): ANTERIOR CERVICAL DECOMPRESSION/DISCECTOMY FUSION 2 LEVELS (N/A)  Patient Location: PACU  Anesthesia Type: General  Level of Consciousness: awake, alert  and oriented  Airway and Oxygen Therapy: Patient Spontanous Breathing and Patient connected to nasal cannula oxygen  Post-op Pain: mild  Post-op Assessment: Post-op Vital signs reviewed and Patient's Cardiovascular Status Stable  Post-op Vital Signs: stable  Complications: No apparent anesthesia complications

## 2011-08-21 NOTE — Progress Notes (Signed)
Subjective:  Patient denies any chest pain or shortness of breath. Patient tolerated anterior cervical decompression/discectomy fusion well  Objective:  Vital Signs in the last 24 hours: Temp:  [97.7 F (36.5 C)-98.2 F (36.8 C)] 97.7 F (36.5 C) (08/19 1528) Pulse Rate:  [43-84] 48  (08/19 1700) Resp:  [18-23] 18  (08/19 1700) BP: (92-115)/(58-89) 99/58 mmHg (08/19 1700) SpO2:  [96 %-100 %] 97 % (08/19 1700) Weight:  [98.7 kg (217 lb 9.5 oz)] 98.7 kg (217 lb 9.5 oz) (08/19 0634)  Intake/Output from previous day: 08/18 0701 - 08/19 0700 In: 1226 [P.O.:980; I.V.:240; IV Piggyback:6] Out: 1425 [Urine:1425] Intake/Output from this shift:    Physical Exam: Neck: no adenopathy, no carotid bruit, no JVD and supple, symmetrical, trachea midline Lungs: Decreased breath sound at bases Heart: irregularly irregular rhythm, S1, S2 normal and Soft systolic murmur noted Abdomen: soft, non-tender; bowel sounds normal; no masses,  no organomegaly Extremities: extremities normal, atraumatic, no cyanosis or edema  Lab Results:  [facility] 08/21/11 0610 08/20/11 0510  WBC 5.7 6.0  HGB 15.4 14.9  PLT 211 185    Basename 08/20/11 0510 08/19/11 0500  NA 138 141  K 3.5 3.3*  CL 99 99  CO2 25 28  GLUCOSE 102* 103*  BUN 50* 49*  CREATININE 2.60* 2.43*    Basename 08/19/11 1216 08/19/11 0500  TROPONINI <0.30 <0.30   Hepatic Function Panel No results found for this basename: PROT,ALBUMIN,AST,ALT,ALKPHOS,BILITOT,BILIDIR,IBILI in the last 72 hours  Basename 08/19/11 0500  CHOL 193   No results found for this basename: PROTIME in the last 72 hours  Imaging: Imaging results have been reviewed and Dg Cervical Spine 2-3 Views  08/21/2011  *RADIOLOGY REPORT*  Clinical Data: Neck pain  CERVICAL SPINE - 2+ VIEWS  Comparison: CT myelogram 07/24/2011.  Findings: C-arm films document apparent C5-C7 ACDF.  Recommend correlation with plain films postoperatively, as the upper cervical region cannot  be visualized to count down from the odontoid to confirm plate placement.  IMPRESSION: Two level fusion as described.   Original Report Authenticated By: Elsie Stain, M.D.    Dg C-arm [phone] Min  08/21/2011  *RADIOLOGY REPORT*  Clinical Data: Neck pain  CERVICAL SPINE - 2+ VIEWS  Comparison: CT myelogram 07/24/2011.  Findings: C-arm films document apparent C5-C7 ACDF.  Recommend correlation with plain films postoperatively, as the upper cervical region cannot be visualized to count down from the odontoid to confirm plate placement.  IMPRESSION: Two level fusion as described.   Original Report Authenticated By: Elsie Stain, M.D.     Cardiac Studies:  Assessment/Plan:  Status post anterior cervical decompression/discectomy with fusion at C5/C7. 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 fib with slow ventricular response and hypotension Check labs in a.m.    LOS: 3 days    Chrisma Hurlock N 08/21/2011, 7:05 PM

## 2011-08-21 NOTE — Interval H&P Note (Signed)
History and Physical Interval Note:  08/21/2011 12:22 PM  [name]  has presented today for surgery, with the diagnosis of C5-6, C6-7 Spondylosis  The various methods of treatment have been discussed with the patient and family. After consideration of risks, benefits and other options for treatment, the patient has consented to  Procedure(s) (LRB): ANTERIOR CERVICAL DECOMPRESSION/DISCECTOMY FUSION 2 LEVELS (N/A) as a surgical intervention .  The patient's history has been reviewed, patient examined, no change in status, stable for surgery.  I have reviewed the patient's chart and labs.  Questions were answered to the patient's satisfaction.     [name]  Patients Protime is normal . He has been on Amoxicillin for Hx of dental abscess  and is ready for C5-6 , C6-7 ACDF.  Plan allograft and plate fixation.   All ?'s answered. He has not been able to find a dentist to extract his teeth left top posterior 3 teeth he states need to be pulled.  Risks of post op neck infection discussed , he understands and wishes to proceed.

## 2011-08-21 NOTE — Progress Notes (Signed)
ANTICOAGULATION CONSULT NOTE - Follow Up Consult  Pharmacy Consult for Heparin Indication: chest pain/ACS, atrial fibrillation  No Known Allergies  Vital Signs: Temp: 98.2 F (36.8 C) (08/19 0634) Temp src: Oral (08/19 0634) BP: 93/61 mmHg (08/19 0634) Pulse Rate: 76  (08/19 0634)  Labs:  Basename 08/21/11 0610 08/20/11 0510 08/19/11 1216 08/19/11 0500 08/19/11 0129 08/18/11 2156  HGB 15.4 14.9 -- -- -- --  HCT 44.3 43.7 -- 43.8 -- --  PLT 211 185 -- 220 -- --  APTT -- -- -- -- 161* --  LABPROT -- 15.3* -- 18.4* 18.7* --  INR -- 1.18 -- 1.50* 1.53* --  HEPARINUNFRC 0.44 0.38 0.39 -- -- --  CREATININE -- 2.60* -- 2.43* -- 2.50*  CKTOTAL -- -- 143 164 183 --  CKMB -- -- 3.6 3.8 4.0 --  TROPONINI -- -- <0.30 <0.30 <0.30 --    Estimated Creatinine Clearance: 35.7 ml/min (by C-G formula based on Cr of 2.6).   Medications:  Heparin @ 1000 units/hr  Assessment: [name] on coumadin pta for afib, now on heparin gtt while coumadin on hold awaiting spinal surgery today. Heparin level is therapeutic. INR is now wnl - vitamin k 2.5mg  po x 1 given 8/17. CBC stable. No bleeding noted.  Goal of Therapy:  Heparin level 0.3-0.7 units/ml Monitor platelets by anticoagulation protocol: Yes   Plan:  1) Continue heparin at 1000 units/hr 2) Daily heparin level and CBC 3) Follow up resuming coumadin after surgery  Christoper Fabian, PharmD, BCPS Clinical pharmacist, pager [phone] 08/21/2011,8:43 AM

## 2011-08-21 NOTE — Progress Notes (Signed)
ANTICOAGULATION CONSULT NOTE - Follow Up Consult  Pharmacy Consult for Coumadin Indication: atrial fibrillation  No Known Allergies  Patient Measurements: Height: 6\' 3"  (190.5 cm) Weight: 217 lb 9.5 oz (98.7 kg) (scale a) IBW/kg (Calculated) : 84.5  Heparin Dosing Weight:   Vital Signs: Temp: 97.7 F (36.5 C) (08/19 1528) BP: 99/58 mmHg (08/19 1700) Pulse Rate: 48  (08/19 1700)  Labs:  Basename 08/21/11 0610 08/20/11 0510 08/19/11 1216 08/19/11 0500 08/19/11 0129 08/18/11 2156  HGB 15.4 14.9 -- -- -- --  HCT 44.3 43.7 -- 43.8 -- --  PLT 211 185 -- 220 -- --  APTT -- -- -- -- 161* --  LABPROT -- 15.3* -- 18.4* 18.7* --  INR -- 1.18 -- 1.50* 1.53* --  HEPARINUNFRC 0.44 0.38 0.39 -- -- --  CREATININE -- 2.60* -- 2.43* -- 2.50*  CKTOTAL -- -- 143 164 183 --  CKMB -- -- 3.6 3.8 4.0 --  TROPONINI -- -- <0.30 <0.30 <0.30 --    Estimated Creatinine Clearance: 35.7 ml/min (by C-G formula based on Cr of 2.6).   Medications:  Scheduled:    . allopurinol  100 mg Oral Daily  . amiodarone  200 mg Oral Daily  . amoxicillin  500 mg Oral Q8H  . atorvastatin  10 mg Oral q1800  . digoxin  0.25 mg Oral Daily  . furosemide  80 mg Oral BID  . HYDROmorphone      . isosorbide-hydrALAZINE  1 tablet Oral BID  . metolazone  2.5 mg Oral Daily  . pantoprazole  40 mg Oral Q1200  . sucralfate  1 g Oral QID  . warfarin  10 mg Oral Once  . Warfarin - Pharmacist Dosing Inpatient   Does not apply q1800  . DISCONTD: isosorbide-hydrALAZINE  1 tablet Oral TID  . DISCONTD: metoprolol tartrate  12.5 mg Oral BID  . DISCONTD: potassium chloride SA  20 mEq Oral BID  . DISCONTD: sodium chloride  3 mL Intravenous Q12H    Assessment: 61 yr old male admitted for heparin bridging prior to neck surgery. Vitamin K 2.5 mg po was given 8/17. Pt had neck surgery today and is ready to resume coumadin. Home dose was 9 mg daily. Goal of Therapy:  INR 2-3    Plan:  Will give 10 mg coumadin today. Daily  PT/INR ordered.  [name] 08/21/2011,8:37 PM

## 2011-08-21 NOTE — Anesthesia Procedure Notes (Signed)
Procedure Name: Intubation Date/Time: 08/21/2011 12:47 PM Performed by: Jefm Miles E Pre-anesthesia Checklist: Patient identified, Timeout performed, Emergency Drugs available, Suction available and Patient being monitored Patient Re-evaluated:Patient Re-evaluated prior to inductionOxygen Delivery Method: Circle system utilized Preoxygenation: Pre-oxygenation with 100% oxygen Intubation Type: IV induction Ventilation: Two handed mask ventilation required and Oral airway inserted - appropriate to patient size Laryngoscope Size: Mac and 3 Grade View: Grade I Tube type: Oral Tube size: 7.5 mm Number of attempts: 1 Airway Equipment and Method: Stylet Placement Confirmation: ETT inserted through vocal cords under direct vision,  breath sounds checked- equal and bilateral and positive ETCO2 Secured at: 23 cm Tube secured with: Tape Dental Injury: Teeth and Oropharynx as per pre-operative assessment

## 2011-08-21 NOTE — Transfer of Care (Signed)
Immediate Anesthesia Transfer of Care Note  Patient: [name]  Procedure(s) Performed: Procedure(s) (LRB): ANTERIOR CERVICAL DECOMPRESSION/DISCECTOMY FUSION 2 LEVELS (N/A)  Patient Location: PACU  Anesthesia Type: General  Level of Consciousness: awake, confused and lethargic  Airway & Oxygen Therapy: Patient Spontanous Breathing and Patient connected to face mask oxygen  Post-op Assessment: Report given to PACU RN  Post vital signs: Reviewed and stable  Complications: No apparent anesthesia complications

## 2011-08-22 ENCOUNTER — Encounter (HOSPITAL_COMMUNITY): Payer: Self-pay | Admitting: Orthopaedic Surgery

## 2011-08-22 LAB — CBC
HCT: 45.3 % (ref 39.0–52.0)
Hemoglobin: 15.5 g/dL (ref 13.0–17.0)
MCH: 31.3 pg (ref 26.0–34.0)
MCHC: 34.2 g/dL (ref 30.0–36.0)
MCV: 91.5 fL (ref 78.0–100.0)

## 2011-08-22 LAB — BASIC METABOLIC PANEL
BUN: 47 mg/dL — ABNORMAL HIGH (ref 6–23)
CO2: 34 mEq/L — ABNORMAL HIGH (ref 19–32)
Chloride: 95 mEq/L — ABNORMAL LOW (ref 96–112)
Creatinine, Ser: 2.84 mg/dL — ABNORMAL HIGH (ref 0.50–1.35)
Glucose, Bld: 113 mg/dL — ABNORMAL HIGH (ref 70–99)

## 2011-08-22 MED ORDER — WARFARIN SODIUM 10 MG PO TABS
10.0000 mg | ORAL_TABLET | Freq: Once | ORAL | Status: AC
  Administered 2011-08-22: 10 mg via ORAL
  Filled 2011-08-22: qty 1

## 2011-08-22 MED ORDER — MENTHOL 3 MG MT LOZG
1.0000 | LOZENGE | OROMUCOSAL | Status: DC | PRN
  Administered 2011-08-22 – 2011-08-23 (×2): 3 mg via ORAL
  Filled 2011-08-22: qty 9

## 2011-08-22 MED ORDER — DOCUSATE SODIUM 100 MG PO CAPS
100.0000 mg | ORAL_CAPSULE | Freq: Two times a day (BID) | ORAL | Status: DC
  Administered 2011-08-22 – 2011-08-23 (×2): 100 mg via ORAL
  Filled 2011-08-22 (×3): qty 1

## 2011-08-22 NOTE — Progress Notes (Deleted)
Pt. C/o difficulty urinating. Stated he has the urge to urinate yet is unable to void. MD notified. Orders to in/out cath given. Will carry out treatment as ordered and continue to monitor pt. [name], [name]

## 2011-08-22 NOTE — Progress Notes (Signed)
Pt. C/o difficulty urinating. Stated he has the urge to urinate yet is unable to void. 582 mL of urine noted in bladder via bladder scan.  MD notified. Orders to in/out cath given. Will carry out treatment as ordered and continue to monitor pt. Nayel Purdy, [name]

## 2011-08-22 NOTE — Progress Notes (Signed)
Pts. Hemovac drain to neck noted unconnected, on call ortho MD notified. Instructed to monitor pts. Dressing to neck.. Will continue to monitor. Beatriz Settles, [name]

## 2011-08-22 NOTE — Progress Notes (Signed)
ANTICOAGULATION CONSULT NOTE - Follow Up Consult  Pharmacy Consult for Coumadin Indication: atrial fibrillation  No Known Allergies  Patient Measurements: Height: 6\' 3"  (190.5 cm) Weight: 220 lb 3.8 oz (99.9 kg) IBW/kg (Calculated) : 84.5   Vital Signs: Temp: 97.3 F (36.3 C) (08/20 0542) Temp src: Axillary (08/20 0542) BP: 123/82 mmHg (08/20 0913) Pulse Rate: 52  (08/20 0913)  Labs:  Basename 08/22/11 0549 08/21/11 0610 08/20/11 0510 08/19/11 1216  HGB 15.5 15.4 -- --  HCT 45.3 44.3 43.7 --  PLT 195 211 185 --  APTT -- -- -- --  LABPROT 13.9 -- 15.3* --  INR 1.05 -- 1.18 --  HEPARINUNFRC -- 0.44 0.38 0.39  CREATININE 2.84* -- 2.60* --  CKTOTAL -- -- -- 143  CKMB -- -- -- 3.6  TROPONINI -- -- -- <0.30    Estimated Creatinine Clearance: 32.6 ml/min (by C-G formula based on Cr of 2.84).   Assessment: 61 yr old male admitted for heparin bridging prior to neck surgery. Vitamin K 2.5 mg po was given 8/17. Pt had neck surgery 8/20 and is ready to resume coumadin without load per Ortho MD. Home dose was 9 mg daily.  Goal of Therapy:  INR 2-3  Plan:  1) Will give 10 mg coumadin again today.  2) F/u daily PT/INR   Christoper Fabian, PharmD, BCPS Clinical pharmacist, pager [phone] 08/22/2011,9:59 AM

## 2011-08-22 NOTE — Plan of Care (Signed)
Problem: Consults Goal: Skin Care Protocol Initiated - if indicated If consults are not indicated, leave blank or document N/A Outcome: Progressing Pt has dsg to neck s/p surgery, dsg is clean/dry/intact, no drainage noted, pt has neck collar on, no further skin issues noted, will continue to monitor Worthy Flank, RN     Problem: Phase I Progression Outcomes Goal: Pain controlled with appropriate interventions Outcome: Completed/Met Date Met:  08/22/11 Pt has had pain to surgical site, PRN pain meds given as ordered with some relief, pt states the pain is starting to "ease up" pt has been using distraction, change of position and medication to help relieve pain, will continue to monitor Worthy Flank, RN  Goal: OOB as tolerated unless otherwise ordered Outcome: Completed/Met Date Met:  08/22/11 Pt oob independently, tolerating well, BP 111/80  Pulse 71  Temp 98.3 F (36.8 C) (Oral)  Resp 20  Ht 6\' 3"  (1.905 m)  Wt 99.9 kg (220 lb 3.8 oz)  BMI 27.53 kg/m2  SpO2 94% will continue to monitor Worthy Flank, RN

## 2011-08-22 NOTE — Op Note (Signed)
[name], [name]             ACCOUNT NO.:  [id number]  MEDICAL RECORD NO.:  [id number]  LOCATION:  4709                         FACILITY:  [facility]  PHYSICIAN:  Amirrah Quigley C. Ophelia Charter, M.D.    DATE OF BIRTH:  June 09, 1950  DATE OF PROCEDURE:  08/21/2011 DATE OF DISCHARGE:                              OPERATIVE REPORT   PREOPERATIVE DIAGNOSIS:  Cervical spondylosis with right foraminal stenosis, C5-6 and C6-7.  POSTOPERATIVE DIAGNOSIS:  Cervical spondylosis with right foraminal stenosis, C5-6 and C6-7.  PROCEDURE:  C5-6 and C6-7 anterior cervical diskectomy and fusion with allograft and plate.  SURGEON:  Hailey Stormer C. Ophelia Charter, MD  ANESTHESIA:  GOT.  EBL:  100 mL.  DRAINS:  One Hemovac, neck.  COMPLICATIONS:  None.  This patient with pacemaker, on chronic Coumadin therapy, was scheduled to have surgery 3 days ago on Friday, but had stopped his Coumadin only 2 days prior to the procedure instead of the 5 as instructed and was still anticoagulated and surgery was delayed due to this.  He was admitted by the Cardiology Service, kept on some heparin for cardiac protection and Coumadin was not given over the weekend and he now was taken to the operating room for the planned surgery, treatment with normal protime.  After prepping and draping with the arm tucked at the side, intubation, soft collar was available during the case and head halter traction was applied without weight.  Tincture of benzoin and tape applied.  DuraPrep was used.  Prepping with DuraPrep.  The area squared with towels, sterile Mayo stand at the head, sterile skin marker on the planned level incision based on palpable landmarks and Betadine, Steri-Drape.  Thyroid sheets and drapes were applied.  Time-out procedure was completed. Incision was started at the midline, extended to the left.  Platysma was divided in line with the fibers.  Longus coli and palpable spurs were noted, expected level at C5-6 was noted and a  cross-table lateral C-arm spot picture after draping was used to confirm at this level.  After placement of T-plated Cloward retractor right and left smooth blades up and down, the patient was ready for diskectomy.  Operative microscope was brought in.  Diskectomy was performed using a scalpel, pituitaries, Cloward curettes, microdissection techniques, 1-2 mm Kerrison.  A 4-mm burr was used to smooth off some of the endplates.  The posterior spurs were removed with 1 mm Kerrison.  Dura was exposed and foraminotomy was performed on the right side removing spurs posteriorly.  Trial sizes showed 6-mm, restored the height of the disk space that had collapsed. A 7-mm was too tight.  After resting the endplate, rehydration of 6-mm lordotic graft, cortical cancellous traction was applied by the CRNA as the graft was tapped into place.  Identical procedure was then repeated at the C6-7 level.  On this level, the graft was countersunk 2 mm exactly as the C5-6 level had been done.  Foramina was tight.  There was disk bulging and once the dura was decompressed, another 6-mm graft was placed based on sizing, marked in the midline, countersunk 2 mm.  A 36 plate was little bit long, VueLock, Biomet.  34 gave the appropriate length.  Prong was placed in the left side in the C6 vertebrae.  Hand drilling placement of 14-mm screws.  Confirmation of good position AP and lateral was performed.  All screws were down flushed, locked in, tight with a ring.  Hemovac was placed, in and out technique along with skin incision.  Platysma was reapproximated with 3-0 Vicryl, 4-0 Vicryl subcuticular closure, 4x4s, tape and soft cervical collar.  The patient tolerated the procedure well, and was transferred to the recovery room in stable condition.     [name] C. Ophelia Charter, M.D.     MCY/MEDQ  D:  08/21/2011  T:  08/22/2011  Job:  [id number]

## 2011-08-22 NOTE — Progress Notes (Addendum)
Subjective: 1 Day Post-Op Procedure(s) (LRB): ANTERIOR CERVICAL DECOMPRESSION/DISCECTOMY FUSION 2 LEVELS (N/A) Patient reports pain as moderate.    Objective: Vital signs in last 24 hours: Temp:  [97.3 F (36.3 C)-97.7 F (36.5 C)] 97.3 F (36.3 C) (08/20 0542) Pulse Rate:  [43-84] 54  (08/20 0542) Resp:  [18-23] 20  (08/20 0542) BP: (92-138)/(58-92) 113/79 mmHg (08/20 0542) SpO2:  [97 %-100 %] 98 % (08/20 0542) Weight:  [99.9 kg (220 lb 3.8 oz)] 99.9 kg (220 lb 3.8 oz) (08/20 0542)  Intake/Output from previous day: 08/19 0701 - 08/20 0700 In: 1558.8 [P.O.:240; I.V.:1318.8] Out: 750 [Urine:700; Blood:50] Intake/Output this shift: Total I/O In: 690 [P.O.:240; I.V.:450] Out: 700 [Urine:700]   Basename 08/22/11 0549 08/21/11 0610 08/20/11 0510  HGB 15.5 15.4 14.9    Basename 08/22/11 0549 08/21/11 0610  WBC 7.5 5.7  RBC 4.95 4.98  HCT 45.3 44.3  PLT 195 211    Basename 08/20/11 0510  NA 138  K 3.5  CL 99  CO2 25  BUN 50*  CREATININE 2.60*  GLUCOSE 102*  CALCIUM 9.7    Basename 08/22/11 0549 08/20/11 0510  LABPT -- --  INR 1.05 1.18    Neurologically intact  Assessment/Plan: 1 Day Post-Op Procedure(s) (LRB): ANTERIOR CERVICAL DECOMPRESSION/DISCECTOMY FUSION 2 LEVELS (N/A) Difficulty voiding,  Bladder scan more than 500cc. I and O cath times one.  Likely secondary to  Pain med use.    Drain pulled out during the night . Patient was very restless.    Incision looks good.  Rx put on chart for vicodin  for post op pain.  OK for coumadin resumption without load. No heparin due to risk of epidural hematoma and paralysis.  Home once OK by cardiology and voiding.  Office follow up with me one week from Friday KVO IV.    Pending potassium level this AM. SL IV.  Kenzly Rogoff C 08/22/2011, 6:45 AM

## 2011-08-22 NOTE — Progress Notes (Signed)
Orthopedic Tech Progress Note Patient Details:  [name] Sep 10, 1950 [id number]  Patient ID: [name], male   DOB: 26-Dec-1950, 61 y.o.   MRN: [id number]   [name] 08/22/2011, 2:13 PM PT HAS COLLAR ON

## 2011-08-22 NOTE — Progress Notes (Signed)
Subjective:  Patient denies any chest pain or shortness of breath. Denies any palpitation. Complains of inability to void requiring straight cath earlier today  Objective:  Vital Signs in the last 24 hours: Temp:  [97.3 F (36.3 C)-97.7 F (36.5 C)] 97.3 F (36.3 C) (08/20 0542) Pulse Rate:  [43-84] 52  (08/20 0913) Resp:  [18-23] 20  (08/20 0542) BP: (92-138)/(58-92) 123/82 mmHg (08/20 0913) SpO2:  [97 %-100 %] 98 % (08/20 0542) Weight:  [99.9 kg (220 lb 3.8 oz)] 99.9 kg (220 lb 3.8 oz) (08/20 0542)  Intake/Output from previous day: 08/19 0701 - 08/20 0700 In: 1558.8 [P.O.:240; I.V.:1318.8] Out: 1200 [Urine:1150; Blood:50] Intake/Output from this shift: Total I/O In: 240 [P.O.:240] Out: 1179 [Urine:1179]  Physical Exam: Neck: Cervical collar noted Lungs: clear to auscultation bilaterally Heart: irregularly irregular rhythm, S1, S2 normal and Soft systolic murmur noted Abdomen: soft, non-tender; bowel sounds normal; no masses,  no organomegaly Extremities: extremities normal, atraumatic, no cyanosis or edema  Lab Results:  Basename 08/22/11 0549 08/21/11 0610  WBC 7.5 5.7  HGB 15.5 15.4  PLT 195 211    Basename 08/22/11 0549 08/20/11 0510  NA 138 138  K 3.4* 3.5  CL 95* 99  CO2 34* 25  GLUCOSE 113* 102*  BUN 47* 50*  CREATININE 2.84* 2.60*    Basename 08/19/11 1216  TROPONINI <0.30   Hepatic Function Panel No results found for this basename: PROT,ALBUMIN,AST,ALT,ALKPHOS,BILITOT,BILIDIR,IBILI in the last 72 hours No results found for this basename: CHOL in the last 72 hours No results found for this basename: PROTIME in the last 72 hours  Imaging: Imaging results have been reviewed and Dg Cervical Spine 2-3 Views  08/21/2011  *RADIOLOGY REPORT*  Clinical Data: Neck pain  CERVICAL SPINE - 2+ VIEWS  Comparison: CT myelogram 07/24/2011.  Findings: C-arm films document apparent C5-C7 ACDF.  Recommend correlation with plain films postoperatively, as the upper  cervical region cannot be visualized to count down from the odontoid to confirm plate placement.  IMPRESSION: Two level fusion as described.   Original Report Authenticated By: Elsie Stain, M.D.    Dg C-arm [phone] Min  08/21/2011  *RADIOLOGY REPORT*  Clinical Data: Neck pain  CERVICAL SPINE - 2+ VIEWS  Comparison: CT myelogram 07/24/2011.  Findings: C-arm films document apparent C5-C7 ACDF.  Recommend correlation with plain films postoperatively, as the upper cervical region cannot be visualized to count down from the odontoid to confirm plate placement.  IMPRESSION: Two level fusion as described.   Original Report Authenticated By: Elsie Stain, M.D.     Cardiac Studies:  Assessment/Plan:  Status post anterior cervical decompression/discectomy with fusion at C5/C7. 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 Discussed with patient and understands. Will DC home tomorrow if stable if okay with neurosurgery  LOS: 4 days    [name] N 08/22/2011, 12:05 PM

## 2011-08-23 LAB — BASIC METABOLIC PANEL
BUN: 45 mg/dL — ABNORMAL HIGH (ref 6–23)
Calcium: 10.1 mg/dL (ref 8.4–10.5)
GFR calc Af Amer: 28 mL/min — ABNORMAL LOW (ref >90)
GFR calc non Af Amer: 24 mL/min — ABNORMAL LOW (ref >90)
Potassium: 3.3 mEq/L — ABNORMAL LOW (ref 3.5–5.1)
Sodium: 137 mEq/L (ref 135–145)

## 2011-08-23 LAB — CBC
MCH: 31.1 pg (ref 26.0–34.0)
Platelets: 181 10*3/uL (ref 150–400)
RBC: 4.85 MIL/uL (ref 4.22–5.81)
RDW: 13.3 % (ref 11.5–15.5)
WBC: 7.8 10*3/uL (ref 4.0–10.5)

## 2011-08-23 LAB — PROTIME-INR: Prothrombin Time: 15.1 seconds (ref 11.6–15.2)

## 2011-08-23 MED ORDER — AMOXICILLIN 500 MG PO CAPS: 500.0000 mg | ORAL_CAPSULE | Freq: Three times a day (TID) | ORAL | Status: AC

## 2011-08-23 MED ORDER — POTASSIUM CHLORIDE CRYS ER 20 MEQ PO TBCR
40.0000 meq | EXTENDED_RELEASE_TABLET | Freq: Once | ORAL | Status: AC
  Administered 2011-08-23: 40 meq via ORAL
  Filled 2011-08-23: qty 2

## 2011-08-23 MED ORDER — BISACODYL 10 MG RE SUPP
10.0000 mg | Freq: Once | RECTAL | Status: AC
  Administered 2011-08-23: 10 mg via RECTAL
  Filled 2011-08-23: qty 1

## 2011-08-23 MED ORDER — METHOCARBAMOL 500 MG PO TABS: 500.0000 mg | ORAL_TABLET | Freq: Four times a day (QID) | ORAL | Status: AC | PRN

## 2011-08-23 MED ORDER — WARFARIN SODIUM 10 MG PO TABS: 10.0000 mg | ORAL_TABLET | Freq: Once | ORAL | Status: DC

## 2011-08-23 MED ORDER — WARFARIN SODIUM 10 MG PO TABS
10.0000 mg | ORAL_TABLET | Freq: Once | ORAL | Status: AC
  Administered 2011-08-23: 10 mg via ORAL
  Filled 2011-08-23: qty 1

## 2011-08-23 NOTE — Discharge Summary (Signed)
  Discharge summary dated on 08/23/2011 dictation number is [phone]

## 2011-08-23 NOTE — Progress Notes (Addendum)
Patient ID: [name], male   DOB: 24-Oct-1950, 61 y.o.   MRN: [id number] Neck incision looks good. Reflexes intact.  Ambulation yesterday.  C/O   Shoulder spasms. Robaxin given.Dulcolax supp ordered for no BM for 5 days.    FROM ORTHO STANDPOINT OK FOR DISCHARGE. OFFICE FOLLOW UP 10 TO 12 DAYS  MY OFFICE .  PHONE [phone]  Lonna Cobb BEEPER [phone]

## 2011-08-23 NOTE — Progress Notes (Signed)
ANTICOAGULATION CONSULT NOTE - Follow Up Consult  Pharmacy Consult for Coumadin Indication: atrial fibrillation  No Known Allergies  Patient Measurements: Height: 6\' 3"  (190.5 cm) Weight: 216 lb 1.6 oz (98.022 kg) (scale a) IBW/kg (Calculated) : 84.5   Vital Signs: Temp: 98 F (36.7 C) (08/21 0610) Temp src: Oral (08/21 0610) BP: 113/86 mmHg (08/21 0610) Pulse Rate: 77  (08/21 0610)  Labs:  Basename 08/23/11 0522 08/22/11 0549 08/21/11 0610  HGB 15.1 15.5 --  HCT 43.6 45.3 44.3  PLT 181 195 211  APTT -- -- --  LABPROT 15.1 13.9 --  INR 1.17 1.05 --  HEPARINUNFRC -- -- 0.44  CREATININE 2.65* 2.84* --  CKTOTAL -- -- --  CKMB -- -- --  TROPONINI -- -- --    Estimated Creatinine Clearance: 35 ml/min (by C-G formula based on Cr of 2.65).   Assessment: 61 yr old male admitted for heparin bridging prior to neck surgery. On coumadin PTA for afib. Vitamin K 2.5 mg po was given 8/17. Pt had neck surgery 8/20 and coumadin resumed without load per Ortho MD s/p surgery. Home dose was 9 mg daily. INR 1.17 - slight movement on 10mg  x 2 doses. No bleeding noted. CBC stable. Noted plan to possibly d/c home today.  Goal of Therapy:  INR 2-3  Plan:  1) Will give 10 mg coumadin again today.  2) F/u daily PT/INR  3) If pt d/c home - can probably go back on home 9mg  daily with INR f/u in ~5 days  Christoper Fabian, PharmD, BCPS Clinical pharmacist, pager [phone] 08/23/2011,8:48 AM

## 2011-08-23 NOTE — Progress Notes (Signed)
Went over all d/c info and instructions with pt. Pt wearing soft C collar aware he is not to shower and wear collar until he has follow up visit with surgeon. Pt given prescriptions , went over follow  up appts, home meds, and when to call MD. D/C per W/C with all belongings to friend in private vehicle who will take him home.

## 2011-08-24 ENCOUNTER — Encounter (HOSPITAL_COMMUNITY): Payer: Self-pay

## 2011-08-24 NOTE — Discharge Summary (Signed)
[name], [name]             ACCOUNT NO.:  [id number]  MEDICAL RECORD NO.:  [id number]  LOCATION:  4709                         FACILITY:  [facility]  PHYSICIAN:  [name], M.D. DATE OF BIRTH:  1950/11/07  DATE OF ADMISSION:  08/18/2011 DATE OF DISCHARGE:  08/23/2011                              DISCHARGE SUMMARY   ADMITTING DIAGNOSES: 1. Atypical chest pain. 2. Cervical spondylosis with right foraminal stenosis at several     levels of C5-C6 and C6-C7. 3. Nonischemic cardiomyopathy. 4. Chronic atrial fibrillation. 5. History of nonsustained ventricular tachycardia. 6. Hypertension. 7. Glucose intolerance. 8. Chronic kidney disease, stage 4. 9. History of gouty arthritis. 10.Gastroesophageal reflux disease. 11.History of sarcoidosis.  DISCHARGE DIAGNOSES: 1. Status post cervical spondylosis with right foraminal stenosis at     level C5-6, C6-7, status post anterior cervical     decompression/diskectomy with fusion at level of C5-C7. 2. 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 3. Compensated systolic heart failure. 4. Chronic atrial fibrillation. 5. History of nonsustained ventricular tachycardia. 6. Hypertension. 7. Glucose intolerance. 8. Chronic kidney disease, stage 4. 9. History of gouty arthritis. 10.History of gastroesophageal reflux disease. 11.History of sarcoidosis. 12.Status post urinary retention. 13.Hypocalcemia.  DISCHARGE HOME MEDICATIONS: 1. Amoxicillin 1 capsule 500 mg every 8 hours as before for 7 more     days. 2. Robaxin 500 mg 1 every 6 hours as needed. 3. Warfarin 10 mg 1 tablet daily. 4. Allopurinol 100 mg 1 tablet daily. 5. Amiodarone 200 mg 1 tablet daily. 6. Aspirin 81 mg 1 tablet daily. 7. Carvedilol 3.125 mg twice daily. 8. Colchicine 0.6 mg daily. 9. Digoxin 0.25 mg 1 tablet daily. 10.BiDil 1 tablet 3 times daily. 11.Zaroxolyn 2.5 mg 1 tablet daily. 12.Protonix 40 mg 1 tablet daily. 13.Potassium  chloride 20 mEq twice daily. 14.Crestor 5 mg 1 tablet at bedtime. 15.Carafate 1 g every 6 hours as before.  DIET:  Low salt, low cholesterol.  ACTIVITY:  Increase activity slowly as tolerated.  Follow up with Dr. Ophelia Charter in 10-12 days as scheduled.  Follow up with me in 2 weeks.  The patient has been advised to check PT/INR on coming Monday.  CONDITION ON DISCHARGE:  Stable.  BRIEF HISTORY:  [name] is a 61 year old male with past medical history significant for multiple medical problems, i.e., nonischemic dilated cardiomyopathy, status post ICD in the past; history of recurrent congestive heart failure, secondary to noncompliance to diet and medications; hypertension; chronic atrial fibrillation;  history of nonsustained VT; GERD; chronic kidney disease; history of sarcoidosis; history of gouty arthritis.  He was admitted by Dr. Algie Coffer on August 16 because of preop-bright to Coumadin for a cervical neck fusion surgery. The patient also complained of vague chest pain, off and on, without any associated symptoms.  The patient was scheduled to have C spinal surgery on Monday. He has atrial fibrillation and chronic anticoagulation, I have stopped Coumadin 2 days prior to the admission.  He came to ER for bridge to Coumadin and also complains of vague lower abdominal chest pain.  The patient denies any fever or chills.  PAST MEDICAL HISTORY:  As above.  PHYSICAL EXAMINATION:  VITAL SIGNS: His blood  pressure was 118/76, pulse was 49, he was in AFib with slow ventricular response, he was afebrile. HEENT:  Conjunctivae was pink.  Sclerae were nonicteric. NECK:  Supple.  No JVD. LUNGS:  Clear to auscultation. CARDIOVASCULAR:  Irregularly irregular.  There was soft systolic murmur. ABDOMEN: Soft.  Bowel sounds were present.  Nontender. EXTREMITIES:  No clubbing, cyanosis, or edema.  LABORATORY DATA:  Sodium was 140, potassium 3.7, BUN 54, creatinine 2.52.  Three sets of cardiac  enzymes were negative.  Cholesterol was 193, LDL 74, HDL 87, triglycerides 161. Hemoglobin was 14.3, hematocrit 41.5, white count 4.8.  Repeat electrolytes today, sodium 137, potassium 3.8, BUN 45, creatinine 2.65.  Hemoglobin is 15.1, hematocrit 43.6, white count 7.8.  His PT/INR today is 1.17.  BRIEF HOSPITAL COURSE:  The patient was admitted to telemetry unit and was started on IV heparin.  Coumadin was held.  The patient subsequently underwent anterior cervical decompression/diskectomy with fusion of C5- 67 on August 19. The patient tolerated the procedure well. Postoperatively, the patient did not have any episodes of weakness or numbness in the upper extremities.  The patient's Coumadin has been restarted by Surgery, which he is tolerating well.  The patient is not the candidate for restarting the heparin in view of high risk of epidural hematoma and paralysis.  The patient understands slightly high risk of stroke in view of INR being not in therapeutic range.  The patient has been started on Coumadin, which he is tolerating well.  His incision is healing well.  The patient will be discharged home on his home medications and will be followed up closely by Surgery, and also we will monitor his INR closely as outpatient.     [name], M.D.     [facility]/MEDQ  D:  08/23/2011  T:  08/24/2011  Job:  [id number]

## 2011-08-24 NOTE — Op Note (Signed)
[name], [name]             ACCOUNT NO.:  [id number]  MEDICAL RECORD NO.:  [id number]  LOCATION:  PERIO                        FACILITY:  [facility]  PHYSICIAN:  Quinesha Selinger C. Ophelia Charter, M.D.    DATE OF BIRTH:  16-Jun-1950  DATE OF PROCEDURE:  08/21/2011 DATE OF DISCHARGE:                              OPERATIVE REPORT   ADDENDUM  PROCEDURE:  Two-level anterior cervical diskectomy and fusion, C5-6, C6- 7.  Skip Mayer, [facility] was assistant during the procedure and was medically necessary for the care of the patient and was present for the entire procedure.     Angelis Gates C. Ophelia Charter, M.D.     [facility]/MEDQ  D:  08/23/2011  T:  08/24/2011  Job:  [id number]

## 2011-09-15 ENCOUNTER — Encounter: Payer: Self-pay | Admitting: *Deleted

## 2011-09-20 IMAGING — CR DG CHEST 2V
2 series · 2 of 2 positions shown · non-contrast
Comparison: 02/27/2010

CLINICAL DATA: Shortness of breath

CHEST - 2 VIEW

[w chest pa]
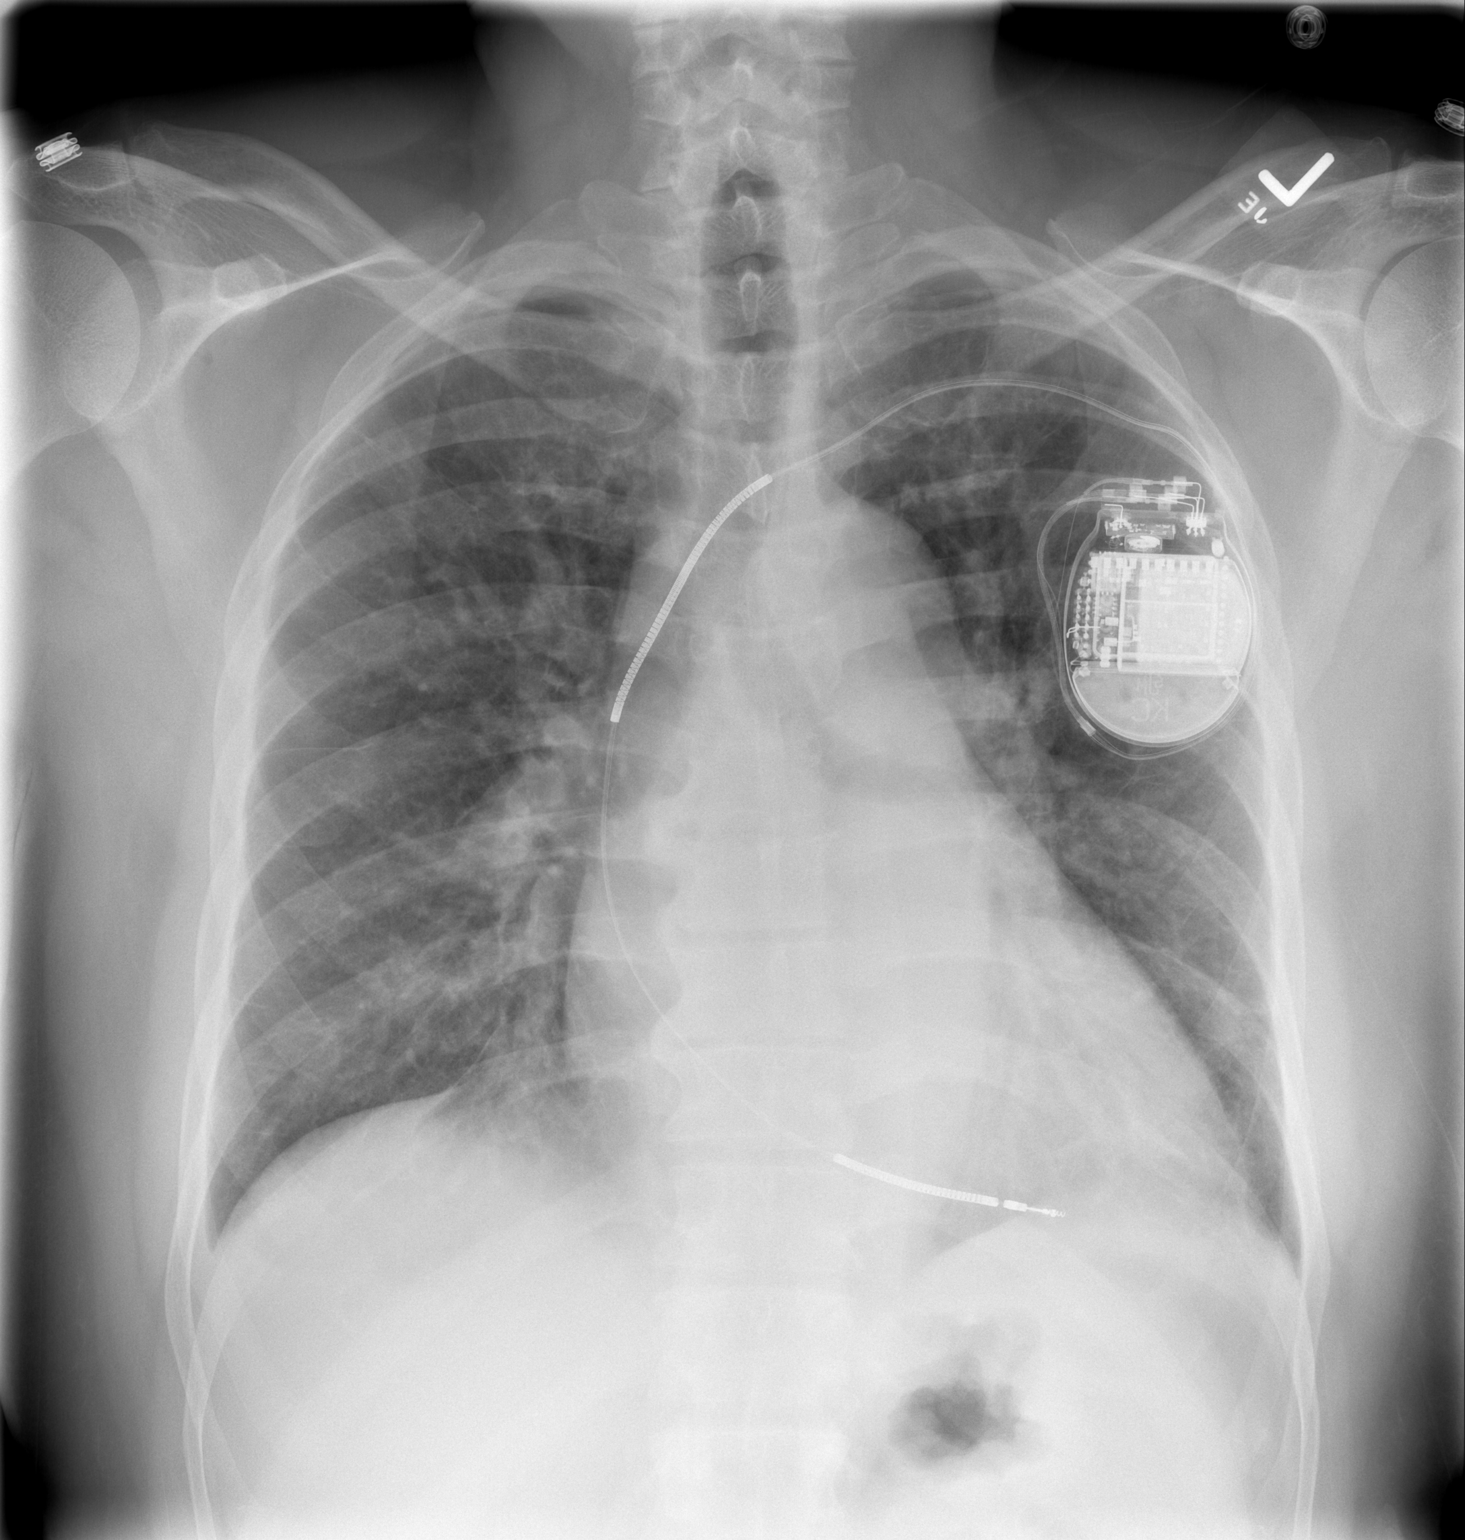

[w chest lat]
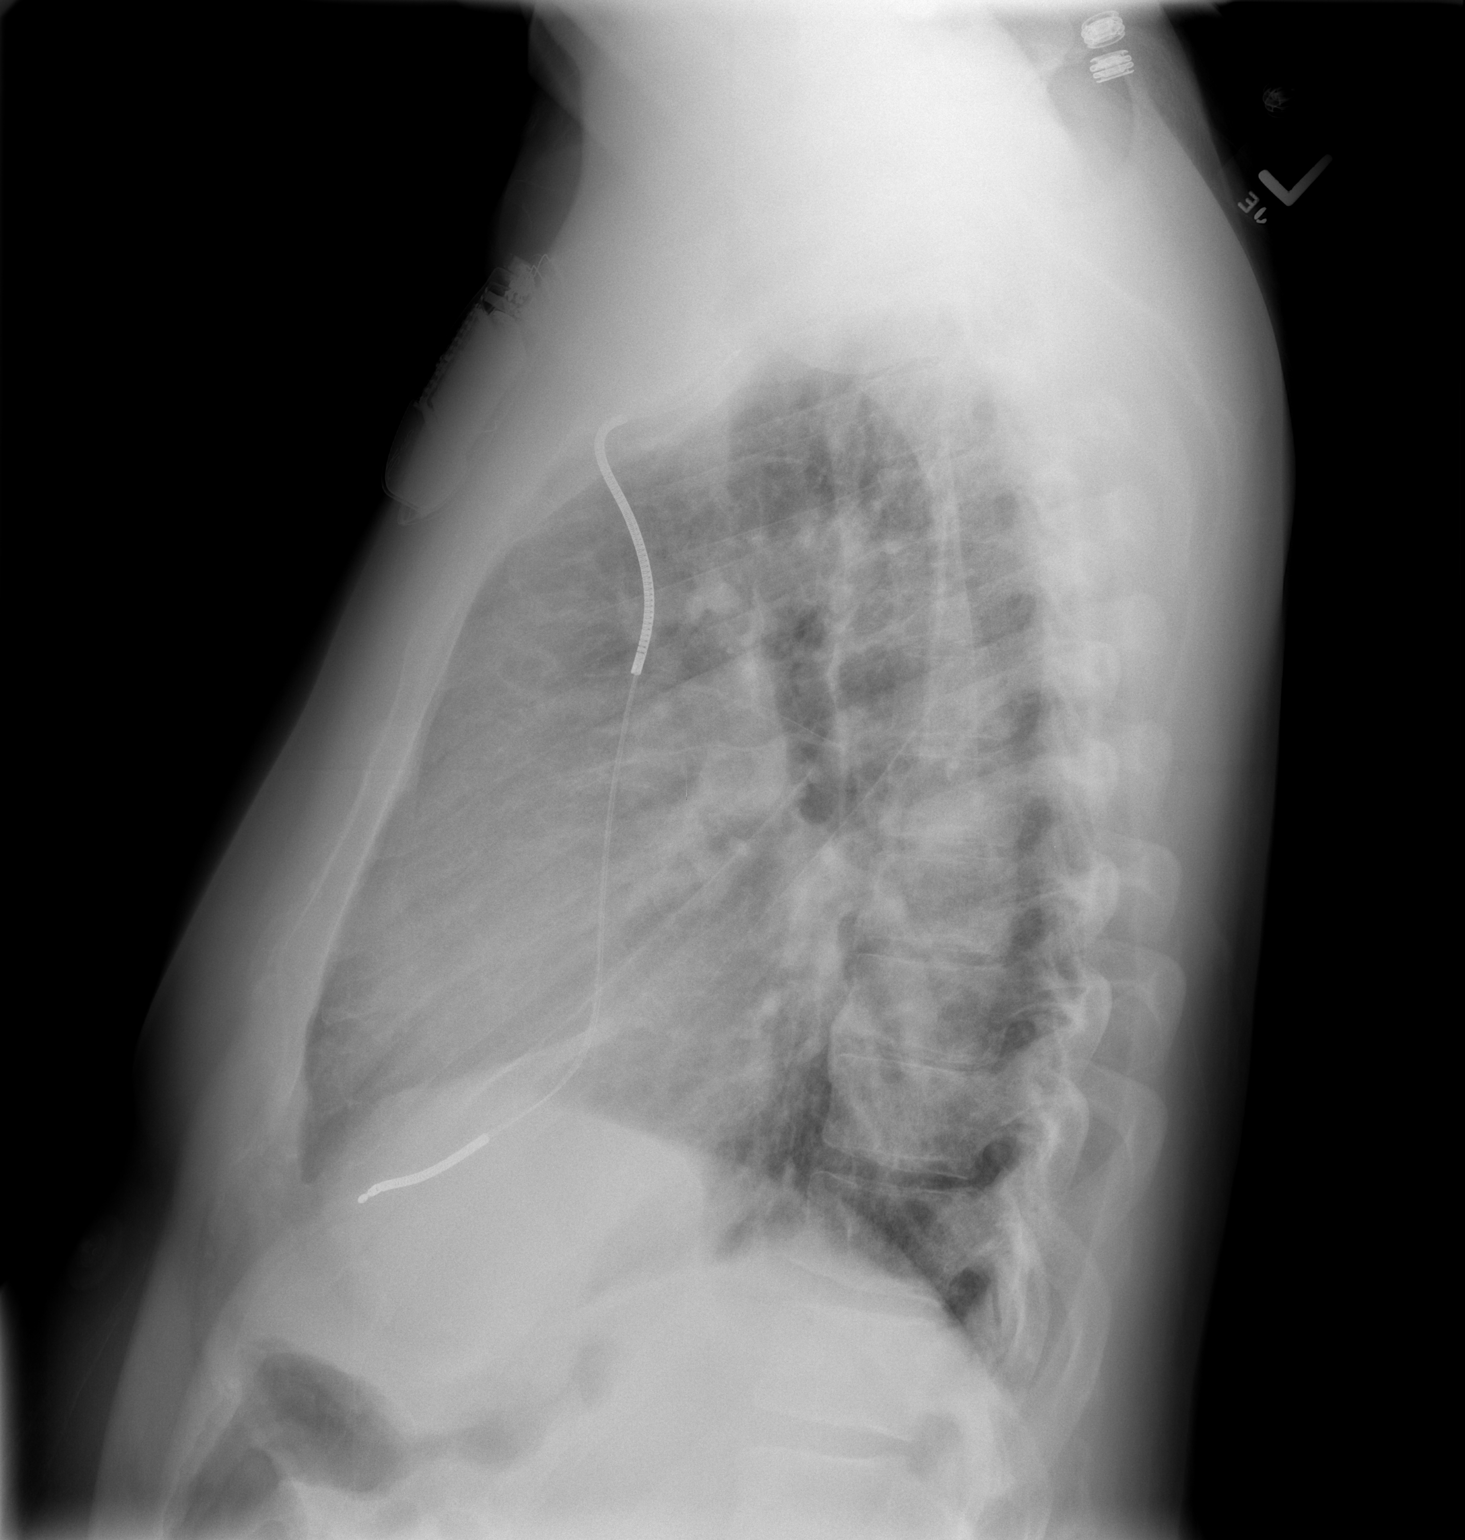

[2 of 2 positions shown; findings below may reference images not displayed]

FINDINGS: Mild interstitial opacities, similar to prior study, most
suggestive of interstitial edema. Mild bibasilar atelectasis.  No
pneumothorax.

Stable cardiomegaly.   Left subclavian pacemaker.

Degenerative changes of the visualized thoracolumbar spine.
IMPRESSION: Mild interstitial edema.  Stable cardiomegaly.

## 2011-09-25 IMAGING — CR DG CHEST 2V
2 series · 2 of 2 positions shown · non-contrast
Comparison: PA and lateral chest 04/08/2010.

CLINICAL DATA: Congestive heart failure.  Shortness of breath.

CHEST - 2 VIEW

[w chest pa]
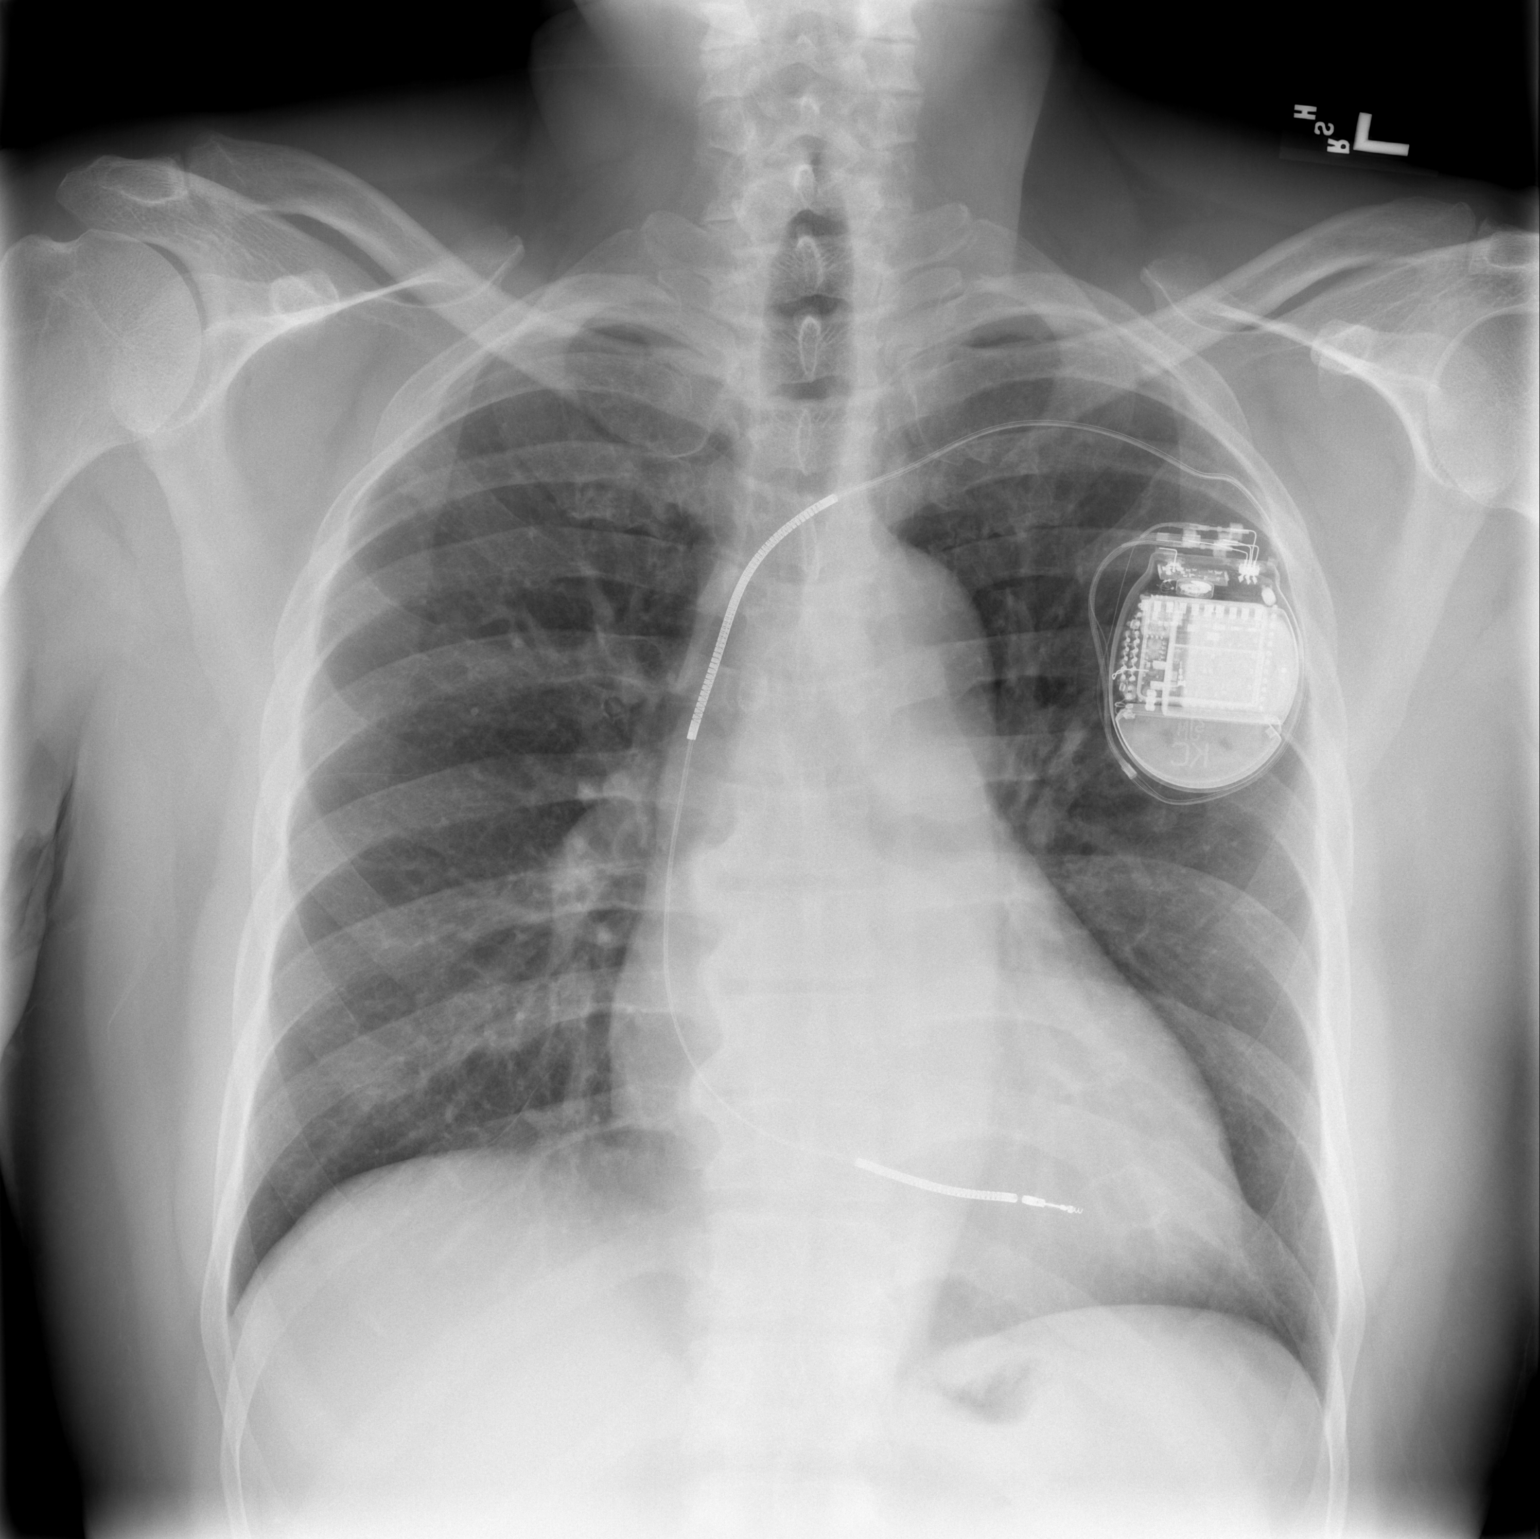

[w chest lat]
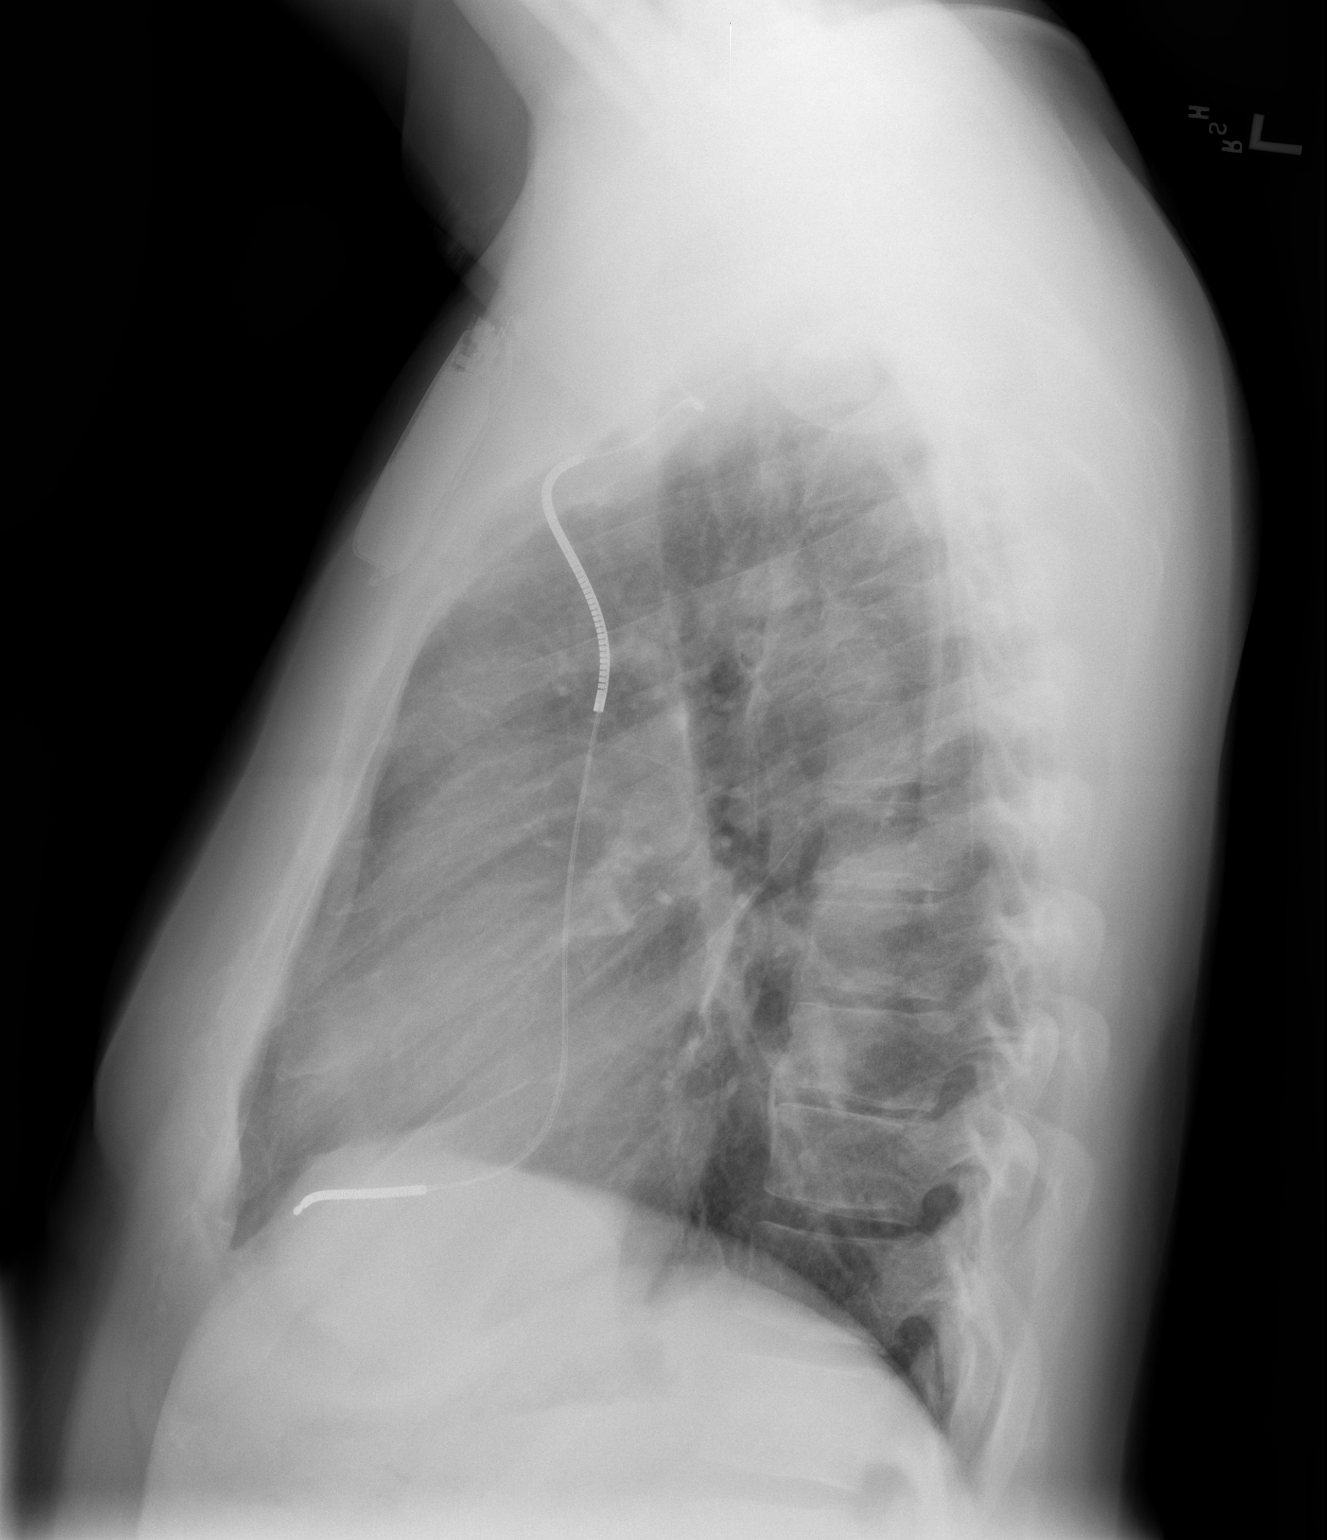

[2 of 2 positions shown; findings below may reference images not displayed]

FINDINGS: There is cardiomegaly but no edema.  Lungs are clear. No
pneumothorax or pleural effusion.  AICD noted.
IMPRESSION: Cardiomegaly without acute disease.

## 2011-09-26 ENCOUNTER — Encounter: Payer: PRIVATE HEALTH INSURANCE | Admitting: Internal Medicine

## 2011-09-30 ENCOUNTER — Other Ambulatory Visit: Payer: Self-pay

## 2011-09-30 ENCOUNTER — Inpatient Hospital Stay (HOSPITAL_COMMUNITY)
Admission: EM | Admit: 2011-09-30 | Discharge: 2011-10-04 | DRG: 312 | Disposition: A | Discharge status: Home / Self Care | Payer: PRIVATE HEALTH INSURANCE | Attending: Internal Medicine | Admitting: Internal Medicine

## 2011-09-30 ENCOUNTER — Encounter (HOSPITAL_COMMUNITY): Payer: Self-pay | Admitting: Emergency Medicine

## 2011-09-30 ENCOUNTER — Emergency Department (HOSPITAL_COMMUNITY): Payer: PRIVATE HEALTH INSURANCE

## 2011-09-30 DIAGNOSIS — I5022 Chronic systolic (congestive) heart failure: Secondary | ICD-10-CM | POA: Diagnosis present

## 2011-09-30 DIAGNOSIS — D869 Sarcoidosis, unspecified: Secondary | ICD-10-CM | POA: Diagnosis present

## 2011-09-30 DIAGNOSIS — Z23 Encounter for immunization: Secondary | ICD-10-CM

## 2011-09-30 DIAGNOSIS — R079 Chest pain, unspecified: Secondary | ICD-10-CM

## 2011-09-30 DIAGNOSIS — I5021 Acute systolic (congestive) heart failure: Secondary | ICD-10-CM

## 2011-09-30 DIAGNOSIS — R55 Syncope and collapse: Secondary | ICD-10-CM

## 2011-09-30 DIAGNOSIS — Z79899 Other long term (current) drug therapy: Secondary | ICD-10-CM

## 2011-09-30 DIAGNOSIS — R0789 Other chest pain: Secondary | ICD-10-CM | POA: Diagnosis present

## 2011-09-30 DIAGNOSIS — I4891 Unspecified atrial fibrillation: Secondary | ICD-10-CM | POA: Diagnosis present

## 2011-09-30 DIAGNOSIS — I472 Ventricular tachycardia, unspecified: Secondary | ICD-10-CM | POA: Diagnosis present

## 2011-09-30 DIAGNOSIS — I10 Essential (primary) hypertension: Secondary | ICD-10-CM

## 2011-09-30 DIAGNOSIS — I129 Hypertensive chronic kidney disease with stage 1 through stage 4 chronic kidney disease, or unspecified chronic kidney disease: Secondary | ICD-10-CM | POA: Diagnosis present

## 2011-09-30 DIAGNOSIS — I428 Other cardiomyopathies: Secondary | ICD-10-CM | POA: Diagnosis present

## 2011-09-30 DIAGNOSIS — Z95810 Presence of automatic (implantable) cardiac defibrillator: Secondary | ICD-10-CM

## 2011-09-30 DIAGNOSIS — K219 Gastro-esophageal reflux disease without esophagitis: Secondary | ICD-10-CM | POA: Diagnosis present

## 2011-09-30 DIAGNOSIS — IMO0002 Reserved for concepts with insufficient information to code with codable children: Secondary | ICD-10-CM

## 2011-09-30 DIAGNOSIS — E7800 Pure hypercholesterolemia, unspecified: Secondary | ICD-10-CM | POA: Diagnosis present

## 2011-09-30 DIAGNOSIS — N182 Chronic kidney disease, stage 2 (mild): Secondary | ICD-10-CM

## 2011-09-30 DIAGNOSIS — I4729 Other ventricular tachycardia: Secondary | ICD-10-CM | POA: Diagnosis present

## 2011-09-30 DIAGNOSIS — I420 Dilated cardiomyopathy: Secondary | ICD-10-CM | POA: Diagnosis present

## 2011-09-30 DIAGNOSIS — I252 Old myocardial infarction: Secondary | ICD-10-CM

## 2011-09-30 DIAGNOSIS — I509 Heart failure, unspecified: Secondary | ICD-10-CM | POA: Diagnosis present

## 2011-09-30 DIAGNOSIS — I951 Orthostatic hypotension: Principal | ICD-10-CM

## 2011-09-30 DIAGNOSIS — I5023 Acute on chronic systolic (congestive) heart failure: Secondary | ICD-10-CM

## 2011-09-30 DIAGNOSIS — M109 Gout, unspecified: Secondary | ICD-10-CM | POA: Diagnosis present

## 2011-09-30 DIAGNOSIS — Z7901 Long term (current) use of anticoagulants: Secondary | ICD-10-CM

## 2011-09-30 HISTORY — DX: Dilated cardiomyopathy: I42.0

## 2011-09-30 LAB — BASIC METABOLIC PANEL
BUN: 40 mg/dL — ABNORMAL HIGH (ref 6–23)
CO2: 25 mEq/L (ref 19–32)
Chloride: 101 mEq/L (ref 96–112)
Chloride: 101 mEq/L (ref 96–112)
Creatinine, Ser: 1.87 mg/dL — ABNORMAL HIGH (ref 0.50–1.35)
GFR calc Af Amer: 42 mL/min — ABNORMAL LOW (ref >90)
GFR calc Af Amer: 43 mL/min — ABNORMAL LOW (ref >90)
GFR calc non Af Amer: 36 mL/min — ABNORMAL LOW (ref >90)
Potassium: 3.3 mEq/L — ABNORMAL LOW (ref 3.5–5.1)
Sodium: 137 mEq/L (ref 135–145)
Sodium: 137 mEq/L (ref 135–145)

## 2011-09-30 LAB — CBC WITH DIFFERENTIAL/PLATELET
Basophils Absolute: 0 10*3/uL (ref 0.0–0.1)
Basophils Relative: 1 % (ref 0–1)
Eosinophils Absolute: 0.4 10*3/uL (ref 0.0–0.7)
Hemoglobin: 14.5 g/dL (ref 13.0–17.0)
MCHC: 34.8 g/dL (ref 30.0–36.0)
Monocytes Relative: 8 % (ref 3–12)
Neutro Abs: 2.5 10*3/uL (ref 1.7–7.7)
Neutrophils Relative %: 48 % (ref 43–77)
Platelets: 201 10*3/uL (ref 150–400)

## 2011-09-30 LAB — POCT I-STAT TROPONIN I: Troponin i, poc: 0.01 ng/mL (ref 0.00–0.08)

## 2011-09-30 LAB — CBC
Platelets: 198 10*3/uL (ref 150–400)
RBC: 4.63 MIL/uL (ref 4.22–5.81)
RDW: 12.8 % (ref 11.5–15.5)
WBC: 4.7 10*3/uL (ref 4.0–10.5)

## 2011-09-30 LAB — TROPONIN I: Troponin I: <0.3 ng/mL (ref <0.30)

## 2011-09-30 MED ORDER — ASPIRIN EC 325 MG PO TBEC
325.0000 mg | DELAYED_RELEASE_TABLET | Freq: Once | ORAL | Status: AC
  Administered 2011-09-30: 325 mg via ORAL
  Filled 2011-09-30: qty 1

## 2011-09-30 MED ORDER — HYDROMORPHONE HCL PF 1 MG/ML IJ SOLN
0.5000 mg | INTRAMUSCULAR | Status: AC
  Administered 2011-10-01: 0.5 mg via INTRAVENOUS
  Filled 2011-09-30: qty 1

## 2011-09-30 MED ORDER — SODIUM CHLORIDE 0.9 % IV BOLUS (SEPSIS)
1000.0000 mL | Freq: Once | INTRAVENOUS | Status: AC
  Administered 2011-09-30: 1000 mL via INTRAVENOUS

## 2011-09-30 NOTE — ED Provider Notes (Signed)
History     CSN: [id number]  Arrival date & time 09/30/11  9562   First MD Initiated Contact with Patient 09/30/11 1825      Chief Complaint  Patient presents with  . Loss of Consciousness  . Irregular Heart Beat    (Consider location/radiation/quality/duration/timing/severity/associated sxs/prior treatment) Patient is a 61 y.o. male presenting with syncope.  Loss of Consciousness This is a new problem. The current episode started yesterday. The problem occurs constantly. The problem has been unchanged. Associated symptoms include chest pain and nausea. Pertinent negatives include no abdominal pain, anorexia, arthralgias, change in bowel habit, chills, congestion, coughing, diaphoresis, fatigue, fever, headaches, joint swelling, myalgias, neck pain, numbness, rash, sore throat, swollen glands, urinary symptoms, vertigo, visual change, vomiting or weakness. The symptoms are aggravated by exertion and walking. He has tried nothing for the symptoms.      He is a 61 year old man with a nonischemic cardiomyopathy, atrial fibrillation, chronic systolic heart failure (EF 35-40 % on echo 01/2011), status post ICD implantation, cath in 2008 with no lesions.  Presents with 2 days of central CP non radiating with SOB and worsened with walking and exertion.  He also reports two syncopal episodes today where after standing from a seated position his vision become blurry , felt lightheaded and collapsed.    Past Medical History  Diagnosis Date  . CHF (congestive heart failure)   . Sarcoidosis   . Cardiomyopathy     non ischemic by cath  . Acute on chronic systolic heart failure   . Automatic implantable cardiac defibrillator in situ   . Atrial fibrillation   . NSVT (nonsustained ventricular tachycardia)   . GERD (gastroesophageal reflux disease)   . Hypercholesteremia   . Coronary artery disease   . Myocardial infarction   . Shortness of breath   . Chronic kidney disease (CKD), stage III  (moderate)   . Pacemaker   . Anginal pain   . Gout   . Hypertension     [name]    Past Surgical History  Procedure Date  . Back surgery 1987    Ruptured disk repair  . Pacemaker insertion     with ICD  . Tee without cardioversion 01/17/2011    Procedure: TRANSESOPHAGEAL ECHOCARDIOGRAM (TEE);  Surgeon: Ricki Rodriguez, MD;  Location: [facility] ENDOSCOPY;  Service: Cardiovascular;  Laterality: N/A;  . Cardioversion 01/17/2011    Procedure: CARDIOVERSION;  Surgeon: Ricki Rodriguez, MD;  Location: [facility] ENDOSCOPY;  Service: Cardiovascular;  Laterality: N/A;  . Cardiac catheterization   . Insert / replace / remove pacemaker   . Anterior cervical decomp/discectomy fusion 08/21/2011    Procedure: ANTERIOR CERVICAL DECOMPRESSION/DISCECTOMY FUSION 2 LEVELS;  Surgeon: Eldred Manges, MD;  Location: [facility] OR;  Service: Orthopedics;  Laterality: N/A;  C5-6, C6-7 Anterior Cervical Discectomy and Fusion, allograft, plate    Family History  Problem Relation Age of Onset  . Heart disease    . Heart failure    . Stroke    . Anesthesia problems Neg Hx   . Hypotension Neg Hx   . Malignant hyperthermia Neg Hx   . Pseudochol deficiency Neg Hx     History  Substance Use Topics  . Smoking status: Never Smoker   . Smokeless tobacco: Never Used  . Alcohol Use: No      Review of Systems  Constitutional: Negative for fever, chills, diaphoresis, activity change, appetite change and fatigue.  HENT: Negative for ear pain, congestion, sore throat, rhinorrhea, neck pain,  neck stiffness and sinus pressure.   Eyes: Negative for pain and redness.  Respiratory: Positive for shortness of breath. Negative for cough and chest tightness.   Cardiovascular: Positive for chest pain and syncope. Negative for palpitations.  Gastrointestinal: Positive for nausea. Negative for vomiting, abdominal pain, diarrhea, abdominal distention, anorexia and change in bowel habit.  Genitourinary: Negative for dysuria, flank pain and  difficulty urinating.  Musculoskeletal: Negative for myalgias, back pain, joint swelling and arthralgias.  Skin: Negative for rash and wound.  Neurological: Positive for syncope. Negative for dizziness, vertigo, weakness, light-headedness, numbness and headaches.  Hematological: Negative for adenopathy.  Psychiatric/Behavioral: Negative for behavioral problems, confusion and agitation.    Allergies  Review of patient's allergies indicates no known allergies.  Home Medications   Current Outpatient Rx  Name Route Sig Dispense Refill  . ALLOPURINOL 100 MG PO TABS Oral Take 100 mg by mouth daily.    . AMIODARONE HCL 200 MG PO TABS Oral Take 200 mg by mouth daily.    . ASPIRIN EC 81 MG PO TBEC Oral Take 81 mg by mouth every other day.     Marland Kitchen CARVEDILOL 3.125 MG PO TABS Oral Take 3.125 mg by mouth 2 (two) times daily with a meal.    . COLCHICINE 0.6 MG PO TABS Oral Take 0.6 mg by mouth daily.    Marland Kitchen DIGOXIN 0.25 MG PO TABS Oral Take 0.25 mg by mouth daily.    . FUROSEMIDE 80 MG PO TABS Oral Take 80 mg by mouth 2 (two) times daily.    Marland Kitchen HYDROCODONE-ACETAMINOPHEN 5-325 MG PO TABS      . ISOSORB DINITRATE-HYDRALAZINE 20-37.5 MG PO TABS Oral Take 1 tablet by mouth 3 (three) times daily.    Marland Kitchen METHOCARBAMOL 500 MG PO TABS      . METOLAZONE 2.5 MG PO TABS Oral Take 2.5 mg by mouth daily.    . OXYCODONE-ACETAMINOPHEN 5-325 MG PO TABS      . PANTOPRAZOLE SODIUM 40 MG PO TBEC Oral Take 40 mg by mouth daily at 12 noon.    Marland Kitchen POTASSIUM CHLORIDE CRYS ER 20 MEQ PO TBCR Oral Take 20 mEq by mouth 2 (two) times daily.    Marland Kitchen ROSUVASTATIN CALCIUM 5 MG PO TABS Oral Take 5 mg by mouth at bedtime.    . SUCRALFATE 1 G PO TABS Oral Take 1 g by mouth 4 (four) times daily.    . TRAMADOL HCL 50 MG PO TABS      . WARFARIN SODIUM 10 MG PO TABS Oral Take 1 tablet (10 mg total) by mouth one time only at 6 PM. 35 tablet 3    There were no vitals taken for this visit.  Physical Exam  Constitutional: He is oriented to  person, place, and time. He appears well-developed and well-nourished. No distress.  HENT:  Head: Normocephalic and atraumatic.  Nose: Nose normal.  Mouth/Throat: Oropharynx is clear and moist.  Eyes: Conjunctivae normal and EOM are normal. Pupils are equal, round, and reactive to light.  Neck: Normal range of motion. Neck supple. No tracheal deviation present.  Cardiovascular: Normal rate, regular rhythm, normal heart sounds and intact distal pulses.   Pulmonary/Chest: Effort normal and breath sounds normal. No respiratory distress. He has no rales. He exhibits tenderness.  Abdominal: Soft. Bowel sounds are normal. He exhibits no distension. There is no tenderness. There is no rebound and no guarding.  Musculoskeletal: Normal range of motion. He exhibits no edema and no tenderness.  Neurological:  He is alert and oriented to person, place, and time.  Skin: Skin is warm and dry.  Psychiatric: He has a normal mood and affect. His behavior is normal.    ED Course  Procedures (including critical care time)    Results for orders placed during the hospital encounter of 09/30/11  CBC WITH DIFFERENTIAL      Component Value Range   WBC 5.2  4.0 - 10.5 K/uL   RBC 4.70  4.22 - 5.81 MIL/uL   Hemoglobin 14.5  13.0 - 17.0 g/dL   HCT 81.1  91.4 - 78.2 %   MCV 88.7  78.0 - 100.0 fL   MCH 30.9  26.0 - 34.0 pg   MCHC 34.8  30.0 - 36.0 g/dL   RDW 95.6  21.3 - 08.6 %   Platelets 201  150 - 400 K/uL   Neutrophils Relative 48  43 - 77 %   Neutro Abs 2.5  1.7 - 7.7 K/uL   Lymphocytes Relative 35  12 - 46 %   Lymphs Abs 1.8  0.7 - 4.0 K/uL   Monocytes Relative 8  3 - 12 %   Monocytes Absolute 0.4  0.1 - 1.0 K/uL   Eosinophils Relative 8 (*) 0 - 5 %   Eosinophils Absolute 0.4  0.0 - 0.7 K/uL   Basophils Relative 1  0 - 1 %   Basophils Absolute 0.0  0.0 - 0.1 K/uL  BASIC METABOLIC PANEL      Component Value Range   Sodium 137  135 - 145 mEq/L   Potassium 3.3 (*) 3.5 - 5.1 mEq/L   Chloride 101  96  - 112 mEq/L   CO2 25  19 - 32 mEq/L   Glucose, Bld 208 (*) 70 - 99 mg/dL   BUN 40 (*) 6 - 23 mg/dL   Creatinine, Ser 5.78 (*) 0.50 - 1.35 mg/dL   Calcium 46.9  8.4 - 62.9 mg/dL   GFR calc non Af Amer 36 (*) >90 mL/min   GFR calc Af Amer 42 (*) >90 mL/min  POCT I-STAT TROPONIN I      Component Value Range   Troponin i, poc 0.01  0.00 - 0.08 ng/mL   Comment 3           CBC      Component Value Range   WBC 4.7  4.0 - 10.5 K/uL   RBC 4.63  4.22 - 5.81 MIL/uL   Hemoglobin 14.4  13.0 - 17.0 g/dL   HCT 52.8  41.3 - 24.4 %   MCV 88.8  78.0 - 100.0 fL   MCH 31.1  26.0 - 34.0 pg   MCHC 35.0  30.0 - 36.0 g/dL   RDW 01.0  27.2 - 53.6 %   Platelets 198  150 - 400 K/uL  BASIC METABOLIC PANEL      Component Value Range   Sodium 137  135 - 145 mEq/L   Potassium 3.3 (*) 3.5 - 5.1 mEq/L   Chloride 101  96 - 112 mEq/L   CO2 25  19 - 32 mEq/L   Glucose, Bld 140 (*) 70 - 99 mg/dL   BUN 40 (*) 6 - 23 mg/dL   Creatinine, Ser 6.44 (*) 0.50 - 1.35 mg/dL   Calcium 9.9  8.4 - 03.4 mg/dL   GFR calc non Af Amer 37 (*) >90 mL/min   GFR calc Af Amer 43 (*) >90 mL/min  TROPONIN I      Component Value  Range   Troponin I <0.30  <0.30 ng/mL  PRO B NATRIURETIC PEPTIDE      Component Value Range   Pro B Natriuretic peptide (BNP) 883.3 (*) 0 - 125 pg/mL  PROTIME-INR      Component Value Range   Prothrombin Time 14.1  11.6 - 15.2 seconds   INR 1.10  0.00 - 1.49      1. Chest pain   2. Syncope       MDM     61 yo M in no acute distress, afebrile, vital signs stable, non toxic appearing who presents with  CP and syncope. Initial troponin negative. BNP 883 similar to prior levels. CRT 1.87 baseline. CBC unremarkable.  CXR with no acute findings. ASA and nitro in ED. No relief of chest pain.  Dilaudid 0.5 improved the CP.  Given hx and risk factors case discussed with Dr. Sharrell Ku (Cardiology) Patient admitted for further care.         Nadara Mustard, MD 10/02/11 [phone]

## 2011-09-30 NOTE — ED Notes (Signed)
Pt reports syncope episode x 2 today. Pt reports felt like his "heart stopped." Skin w/d.

## 2011-09-30 NOTE — ED Notes (Signed)
MD at bedside, aware of pt vital signs, pt skin warm and dry, no distress noted, c/o midsternal chest pain, NS bolus initiated

## 2011-09-30 NOTE — ED Notes (Signed)
Pt reports mid-sternum chest pain/pressure, SOB when standing, dizziness, and weakness. Pt denies abd/back pain, N/V/D, cough or congestion

## 2011-10-01 ENCOUNTER — Encounter (HOSPITAL_COMMUNITY): Payer: Self-pay | Admitting: *Deleted

## 2011-10-01 DIAGNOSIS — R079 Chest pain, unspecified: Secondary | ICD-10-CM

## 2011-10-01 DIAGNOSIS — R55 Syncope and collapse: Secondary | ICD-10-CM

## 2011-10-01 LAB — BASIC METABOLIC PANEL
BUN: 37 mg/dL — ABNORMAL HIGH (ref 6–23)
Calcium: 9.6 mg/dL (ref 8.4–10.5)
Chloride: 99 mEq/L (ref 96–112)
Creatinine, Ser: 1.87 mg/dL — ABNORMAL HIGH (ref 0.50–1.35)
GFR calc Af Amer: 43 mL/min — ABNORMAL LOW (ref >90)
GFR calc non Af Amer: 37 mL/min — ABNORMAL LOW (ref >90)

## 2011-10-01 LAB — TROPONIN I: Troponin I: <0.3 ng/mL (ref <0.30)

## 2011-10-01 LAB — MAGNESIUM: Magnesium: 1.7 mg/dL (ref 1.5–2.5)

## 2011-10-01 MED ORDER — PANTOPRAZOLE SODIUM 40 MG PO TBEC
40.0000 mg | DELAYED_RELEASE_TABLET | Freq: Every day | ORAL | Status: DC
  Administered 2011-10-01 – 2011-10-03 (×3): 40 mg via ORAL
  Filled 2011-10-01 (×3): qty 1

## 2011-10-01 MED ORDER — POTASSIUM CHLORIDE CRYS ER 20 MEQ PO TBCR
EXTENDED_RELEASE_TABLET | ORAL | Status: AC
  Filled 2011-10-01: qty 2

## 2011-10-01 MED ORDER — OXYCODONE-ACETAMINOPHEN 5-325 MG PO TABS
1.0000 | ORAL_TABLET | Freq: Four times a day (QID) | ORAL | Status: DC | PRN
  Administered 2011-10-01 – 2011-10-04 (×10): 1 via ORAL
  Filled 2011-10-01 (×10): qty 1

## 2011-10-01 MED ORDER — SODIUM CHLORIDE 0.9 % IJ SOLN
3.0000 mL | Freq: Two times a day (BID) | INTRAMUSCULAR | Status: DC
  Administered 2011-10-01 – 2011-10-04 (×7): 3 mL via INTRAVENOUS

## 2011-10-01 MED ORDER — METHOCARBAMOL 500 MG PO TABS
500.0000 mg | ORAL_TABLET | Freq: Every day | ORAL | Status: DC
  Administered 2011-10-01 – 2011-10-04 (×4): 500 mg via ORAL
  Filled 2011-10-01 (×4): qty 1

## 2011-10-01 MED ORDER — ZOLPIDEM TARTRATE 5 MG PO TABS
5.0000 mg | ORAL_TABLET | Freq: Every evening | ORAL | Status: DC | PRN
  Administered 2011-10-01 – 2011-10-03 (×4): 5 mg via ORAL
  Filled 2011-10-01 (×4): qty 1

## 2011-10-01 MED ORDER — WARFARIN SODIUM 6 MG PO TABS
6.0000 mg | ORAL_TABLET | Freq: Every day | ORAL | Status: DC
  Administered 2011-10-01 – 2011-10-03 (×3): 6 mg via ORAL
  Filled 2011-10-01 (×4): qty 1

## 2011-10-01 MED ORDER — WARFARIN - PHYSICIAN DOSING INPATIENT: Freq: Every day | Status: DC

## 2011-10-01 MED ORDER — ATORVASTATIN CALCIUM 10 MG PO TABS
10.0000 mg | ORAL_TABLET | Freq: Every day | ORAL | Status: DC
  Administered 2011-10-01 – 2011-10-03 (×3): 10 mg via ORAL
  Filled 2011-10-01 (×4): qty 1

## 2011-10-01 MED ORDER — ISOSORB DINITRATE-HYDRALAZINE 20-37.5 MG PO TABS
1.0000 | ORAL_TABLET | Freq: Two times a day (BID) | ORAL | Status: DC
  Filled 2011-10-01 (×4): qty 1

## 2011-10-01 MED ORDER — ONDANSETRON HCL 4 MG/2ML IJ SOLN: 4.0000 mg | Freq: Four times a day (QID) | INTRAMUSCULAR | Status: DC | PRN

## 2011-10-01 MED ORDER — SODIUM CHLORIDE 0.9 % IJ SOLN: 3.0000 mL | INTRAMUSCULAR | Status: DC | PRN

## 2011-10-01 MED ORDER — POTASSIUM CHLORIDE CRYS ER 20 MEQ PO TBCR
20.0000 meq | EXTENDED_RELEASE_TABLET | ORAL | Status: DC
  Administered 2011-10-01 – 2011-10-03 (×2): 20 meq via ORAL
  Filled 2011-10-01 (×2): qty 1

## 2011-10-01 MED ORDER — CARVEDILOL 3.125 MG PO TABS
3.1250 mg | ORAL_TABLET | Freq: Two times a day (BID) | ORAL | Status: DC
  Administered 2011-10-01: 3.125 mg via ORAL
  Filled 2011-10-01 (×5): qty 1

## 2011-10-01 MED ORDER — SUCRALFATE 1 G PO TABS
1.0000 g | ORAL_TABLET | Freq: Four times a day (QID) | ORAL | Status: DC
  Administered 2011-10-01 – 2011-10-02 (×2): 1 g via ORAL
  Filled 2011-10-01 (×16): qty 1

## 2011-10-01 MED ORDER — INFLUENZA VIRUS VACC SPLIT PF IM SUSP
0.5000 mL | INTRAMUSCULAR | Status: AC
  Administered 2011-10-02: 0.5 mL via INTRAMUSCULAR
  Filled 2011-10-01: qty 0.5

## 2011-10-01 MED ORDER — SODIUM CHLORIDE 0.9 % IV SOLN: 250.0000 mL | INTRAVENOUS | Status: DC | PRN

## 2011-10-01 MED ORDER — ACETAMINOPHEN 325 MG PO TABS: 650.0000 mg | ORAL_TABLET | ORAL | Status: DC | PRN

## 2011-10-01 MED ORDER — ALLOPURINOL 100 MG PO TABS
100.0000 mg | ORAL_TABLET | Freq: Every day | ORAL | Status: DC
  Administered 2011-10-01 – 2011-10-04 (×4): 100 mg via ORAL
  Filled 2011-10-01 (×4): qty 1

## 2011-10-01 MED ORDER — POTASSIUM CHLORIDE CRYS ER 20 MEQ PO TBCR
40.0000 meq | EXTENDED_RELEASE_TABLET | Freq: Once | ORAL | Status: AC
  Administered 2011-10-01: 40 meq via ORAL

## 2011-10-01 MED ORDER — AMIODARONE HCL 200 MG PO TABS
200.0000 mg | ORAL_TABLET | Freq: Every day | ORAL | Status: DC
  Administered 2011-10-01: 200 mg via ORAL
  Filled 2011-10-01 (×2): qty 1

## 2011-10-01 MED ORDER — ASPIRIN EC 81 MG PO TBEC
81.0000 mg | DELAYED_RELEASE_TABLET | Freq: Every day | ORAL | Status: DC
Start: 2011-10-01 — End: 2011-10-01
  Filled 2011-10-01: qty 1

## 2011-10-01 MED ORDER — NITROGLYCERIN 0.4 MG SL SUBL: 0.4000 mg | SUBLINGUAL_TABLET | SUBLINGUAL | Status: DC | PRN

## 2011-10-01 NOTE — Progress Notes (Signed)
Patient admitted with syncope felt possibly secondary to orthostasis; also with CP that is apparently chronic; enzymes neg; diuretics on hold (will hold again today and may need reduced dose at DC). Interrogate device. Patient not on ACEI or ARB; will need to review with his primary cardiologist (? Intolerance in the past). Patient on coumadin for PAF; dc ASA. Olga Millers

## 2011-10-01 NOTE — Progress Notes (Signed)
At 1715, MT called author to inform of WCT. Hard copy to chart--patient was afib (40's-50's?), had a V-paced beat, then had 11 beats of WCT at approximately 140 bpm, followed by two afib beats and then V-paced beats. Assessed patient for symptoms. BP WNL. However, patient stated that he felt "those hard beats I've been telling you about". Describes them as "wham, wham..." Patient also reported feeling dizzy (lying in the bed at the time of occurrence), then like he was "fading away", and then felt "a stillness in my chest".  Paged and reported events to PA. Labs ordered and resulted. Potassium and Magnesium WNL. Held Coreg per order. Will continue to monitor closely. Patient again advised not to get OOB without staff assistance. Patient verbalized that he would follow directions. Bed Alarm engaged. Harlow Asa

## 2011-10-01 NOTE — H&P (Signed)
[name] is an 61 y.o. male.   Chief Complaint: chest pain and syncope HPI: [name] is a middle aged man with a h/o non-ischemic CM, chronic systolic CHF who has had recurrent syncope and palpitations associated with chest pressure and presents for additional evaluation. He has had a long h/o LV dysfunction and chronic systolic heart failure. He was admitted 6 weeks ago with chest pain and atrial fib. He notes 2 episodes over the past days where he got up and passed out. He was out only seconds and did not bite his tongue or lose control of his bowel or bladder. No chest pain then. Yesterday he had palpitations and associated pain in his chest associated with a hard heart beat. He denies any ICD shocks. No peripheral edema.  Past Medical History  Diagnosis Date  . CHF (congestive heart failure)   . Sarcoidosis   . Cardiomyopathy     non ischemic by cath  . Acute on chronic systolic heart failure   . Automatic implantable cardiac defibrillator in situ   . Atrial fibrillation   . NSVT (nonsustained ventricular tachycardia)   . GERD (gastroesophageal reflux disease)   . Hypercholesteremia   . Coronary artery disease   . Myocardial infarction   . Shortness of breath   . Chronic kidney disease (CKD), stage III (moderate)   . Pacemaker   . Anginal pain   . Gout   . Hypertension     [name]    Past Surgical History  Procedure Date  . Back surgery 1987    Ruptured disk repair  . Pacemaker insertion     with ICD  . Tee without cardioversion 01/17/2011    Procedure: TRANSESOPHAGEAL ECHOCARDIOGRAM (TEE);  Surgeon: Ricki Rodriguez, MD;  Location: [facility] ENDOSCOPY;  Service: Cardiovascular;  Laterality: N/A;  . Cardioversion 01/17/2011    Procedure: CARDIOVERSION;  Surgeon: Ricki Rodriguez, MD;  Location: [facility] ENDOSCOPY;  Service: Cardiovascular;  Laterality: N/A;  . Cardiac catheterization   . Insert / replace / remove pacemaker   . Anterior cervical decomp/discectomy fusion  08/21/2011    Procedure: ANTERIOR CERVICAL DECOMPRESSION/DISCECTOMY FUSION 2 LEVELS;  Surgeon: Eldred Manges, MD;  Location: [facility] OR;  Service: Orthopedics;  Laterality: N/A;  C5-6, C6-7 Anterior Cervical Discectomy and Fusion, allograft, plate    Family History  Problem Relation Age of Onset  . Heart disease    . Heart failure    . Stroke    . Anesthesia problems Neg Hx   . Hypotension Neg Hx   . Malignant hyperthermia Neg Hx   . Pseudochol deficiency Neg Hx    Social History:  reports that he has never smoked. He has never used smokeless tobacco. He reports that he does not drink alcohol or use illicit drugs.  Allergies: No Known Allergies   (Not in a hospital admission)  Results for orders placed during the hospital encounter of 09/30/11 (from the past 48 hour(s))  CBC WITH DIFFERENTIAL     Status: Abnormal   Collection Time   09/30/11  6:36 PM      Component Value Range Comment   WBC 5.2  4.0 - 10.5 K/uL    RBC 4.70  4.22 - 5.81 MIL/uL    Hemoglobin 14.5  13.0 - 17.0 g/dL    HCT 16.1  09.6 - 04.5 %    MCV 88.7  78.0 - 100.0 fL    MCH 30.9  26.0 - 34.0 pg    MCHC  34.8  30.0 - 36.0 g/dL    RDW 16.1  09.6 - 04.5 %    Platelets 201  150 - 400 K/uL    Neutrophils Relative 48  43 - 77 %    Neutro Abs 2.5  1.7 - 7.7 K/uL    Lymphocytes Relative 35  12 - 46 %    Lymphs Abs 1.8  0.7 - 4.0 K/uL    Monocytes Relative 8  3 - 12 %    Monocytes Absolute 0.4  0.1 - 1.0 K/uL    Eosinophils Relative 8 (*) 0 - 5 %    Eosinophils Absolute 0.4  0.0 - 0.7 K/uL    Basophils Relative 1  0 - 1 %    Basophils Absolute 0.0  0.0 - 0.1 K/uL   BASIC METABOLIC PANEL     Status: Abnormal   Collection Time   09/30/11  6:36 PM      Component Value Range Comment   Sodium 137  135 - 145 mEq/L    Potassium 3.3 (*) 3.5 - 5.1 mEq/L    Chloride 101  96 - 112 mEq/L    CO2 25  19 - 32 mEq/L    Glucose, Bld 208 (*) 70 - 99 mg/dL    BUN 40 (*) 6 - 23 mg/dL    Creatinine, Ser 4.09 (*) 0.50 - 1.35 mg/dL     Calcium 81.1  8.4 - 10.5 mg/dL    GFR calc non Af Amer 36 (*) >90 mL/min    GFR calc Af Amer 42 (*) >90 mL/min   POCT I-STAT TROPONIN I     Status: Normal   Collection Time   09/30/11  6:51 PM      Component Value Range Comment   Troponin i, poc 0.01  0.00 - 0.08 ng/mL    Comment 3            CBC     Status: Normal   Collection Time   09/30/11  7:37 PM      Component Value Range Comment   WBC 4.7  4.0 - 10.5 K/uL    RBC 4.63  4.22 - 5.81 MIL/uL    Hemoglobin 14.4  13.0 - 17.0 g/dL    HCT 91.4  78.2 - 95.6 %    MCV 88.8  78.0 - 100.0 fL    MCH 31.1  26.0 - 34.0 pg    MCHC 35.0  30.0 - 36.0 g/dL    RDW 21.3  08.6 - 57.8 %    Platelets 198  150 - 400 K/uL   BASIC METABOLIC PANEL     Status: Abnormal   Collection Time   09/30/11  7:37 PM      Component Value Range Comment   Sodium 137  135 - 145 mEq/L    Potassium 3.3 (*) 3.5 - 5.1 mEq/L    Chloride 101  96 - 112 mEq/L    CO2 25  19 - 32 mEq/L    Glucose, Bld 140 (*) 70 - 99 mg/dL    BUN 40 (*) 6 - 23 mg/dL    Creatinine, Ser 4.69 (*) 0.50 - 1.35 mg/dL    Calcium 9.9  8.4 - 62.9 mg/dL    GFR calc non Af Amer 37 (*) >90 mL/min    GFR calc Af Amer 43 (*) >90 mL/min   TROPONIN I     Status: Normal   Collection Time   09/30/11  7:39 PM  Component Value Range Comment   Troponin I <0.30  <0.30 ng/mL   PRO B NATRIURETIC PEPTIDE     Status: Abnormal   Collection Time   09/30/11  7:39 PM      Component Value Range Comment   Pro B Natriuretic peptide (BNP) 883.3 (*) 0 - 125 pg/mL   PROTIME-INR     Status: Normal   Collection Time   09/30/11 11:44 PM      Component Value Range Comment   Prothrombin Time 14.1  11.6 - 15.2 seconds    INR 1.10  0.00 - 1.49    Dg Chest Portable 1 View  09/30/2011  *RADIOLOGY REPORT*  Clinical Data: Loss of consciousness, irregular heart B  PORTABLE CHEST - 1 VIEW  Comparison: Chest radiograph 08/18/2011  Findings: Left-sided pacemaker with single obtuse lead overlies stable heart silhouette.  No  effusion, infiltrate, or pneumothorax. Anterior cervical fusion noted.  IMPRESSION: No acute cardiopulmonary process.   Original Report Authenticated By: Genevive Bi, M.D.     ROS All systems reviewed and negative except as noted in the HPI  PHYSICAL EXAM  Blood pressure 110/73, pulse 71, temperature 98.8 F (37.1 C), temperature source Oral, resp. rate 18, SpO2 100.00%.  Well appearing middle aged man, NAD HEENT: Unremarkable Neck:  No JVD, no thyromegally Lungs:  Clear with no wheezes, rales, or rhonchi HEART:  IRegular rate rhythm, no murmurs, no rubs, no clicks Abd:  soft, positive bowel sounds, no organomegally, no rebound, no guarding Ext:  2 plus pulses, no edema, no cyanosis, no clubbing Skin:  No rashes no nodules Neuro:  CN II through XII intact, motor grossly intact  Tele - atrial fib with intermittent ventricular pacing EKG: Atrial fib with intermittent ventricular pacing  Assessment/Plan  1. Syncope - sounds orthostatic but will want to interogate his ICD. I suspect that as the episodes have both occurred with standing, he needs to back off of his diuretic.  2. Chronic systolic heart failure - His symptoms are fairly well controlled. Will check labs. He has longstanding LV dysfunction.  3. Chest pain - this occurred in the setting of palpitations and I suspect may be due to NSVT. Will r/o for MI, though he has a long standing non-ischemic CM Lewayne Bunting, M.D. 10/01/2011, 1:49 AM

## 2011-10-01 NOTE — Progress Notes (Signed)
Monitor tech called to notify [name] that patient's heart rate dropped into the 40's and ICD began pacing, raising heart rate to the 50's (Coreg given after speaking with MD). Patient asymptomatic at this time. Will continue to monitor. [name]

## 2011-10-01 NOTE — Progress Notes (Signed)
Obtained orthostatic v/s given pt's c/c of syncope. Orthostatic vitals as follows: lying: 120/79, HR 53; sitting: 108/75, HR 58; standing 94/58, HR 84. Paged and informed PA. Order given to hold Bidil today. Advised patient to stay in bed and call for assistance before going to the bathroom. Bed alarm engaged.  Patient also c/o pain in left ankle r/t gout. Takes Percocet at home. Informed PA also--see order management.  Will continue to monitor. Harlow Asa

## 2011-10-02 LAB — BASIC METABOLIC PANEL
BUN: 34 mg/dL — ABNORMAL HIGH (ref 6–23)
Calcium: 9.9 mg/dL (ref 8.4–10.5)
Creatinine, Ser: 1.79 mg/dL — ABNORMAL HIGH (ref 0.50–1.35)
GFR calc Af Amer: 45 mL/min — ABNORMAL LOW (ref >90)
GFR calc non Af Amer: 39 mL/min — ABNORMAL LOW (ref >90)
Glucose, Bld: 93 mg/dL (ref 70–99)
Potassium: 3.8 mEq/L (ref 3.5–5.1)

## 2011-10-02 LAB — CBC
MCH: 31.4 pg (ref 26.0–34.0)
MCHC: 35.4 g/dL (ref 30.0–36.0)
Platelets: 204 10*3/uL (ref 150–400)
RDW: 12.7 % (ref 11.5–15.5)

## 2011-10-02 LAB — PROTIME-INR: Prothrombin Time: 14.1 seconds (ref 11.6–15.2)

## 2011-10-02 MED ORDER — METOPROLOL SUCCINATE ER 25 MG PO TB24
25.0000 mg | ORAL_TABLET | Freq: Every day | ORAL | Status: DC
  Administered 2011-10-02 – 2011-10-04 (×3): 25 mg via ORAL
  Filled 2011-10-02 (×3): qty 1

## 2011-10-02 MED ORDER — SODIUM CHLORIDE 0.9 % IV BOLUS (SEPSIS)
250.0000 mL | Freq: Once | INTRAVENOUS | Status: AC
  Administered 2011-10-02: 250 mL via INTRAVENOUS

## 2011-10-02 MED ORDER — LOSARTAN POTASSIUM 25 MG PO TABS
25.0000 mg | ORAL_TABLET | Freq: Every day | ORAL | Status: DC
  Administered 2011-10-02 – 2011-10-04 (×3): 25 mg via ORAL
  Filled 2011-10-02 (×3): qty 1

## 2011-10-02 NOTE — Progress Notes (Signed)
10/02/2011 8:29 AM Nursing note Upon shift assessment pt. C/o dizziness when sitting up. Orthostatic VS positive from yesterday. Current Bp 109/50. HR 58. [name] paged and made aware. Orders received and enacted. Will continue to closely monitor patient.  Tekisha Darcey, Blanchard Kelch

## 2011-10-02 NOTE — Progress Notes (Signed)
Patient ID: [name], male   DOB: 05-Sep-1950, 61 y.o.   MRN: [id number] Subjective:  Still c/o orthostatic symptoms.   Objective:  Vital Signs in the last 24 hours: Temp:  [97.9 F (36.6 C)-98.2 F (36.8 C)] 97.9 F (36.6 C) (09/30 0755) Pulse Rate:  [53-84] 68  (09/30 0755) Resp:  [18-20] 20  (09/30 0755) BP: (94-120)/(58-79) 109/69 mmHg (09/30 0755) SpO2:  [93 %-100 %] 100 % (09/30 0755)  Intake/Output from previous day: 09/29 0701 - 09/30 0700 In: 300 [P.O.:300] Out: 750 [Urine:750] Intake/Output from this shift: Total I/O In: 240 [P.O.:240] Out: 300 [Urine:300]  Physical Exam: Well appearing NAD HEENT: Unremarkable Neck:  No JVD, no thyromegally Lungs:  Clear with no wheezes, rales, or rhonchi HEART:  IRegular rate rhythm, no murmurs, no rubs, no clicks Abd:  soft, positive bowel sounds, no organomegally, no rebound, no guarding Ext:  2 plus pulses, no edema, no cyanosis, no clubbing Skin:  No rashes no nodules Neuro:  CN II through XII intact, motor grossly intact  Lab Results:  Basename 10/02/11 0640 09/30/11 1937  WBC 5.3 4.7  HGB 14.9 14.4  PLT 204 198    Basename 10/02/11 0640 10/01/11 1753  NA 137 135  K 3.8 3.6  CL 100 99  CO2 30 25  GLUCOSE 93 132*  BUN 34* 37*  CREATININE 1.79* 1.87*    Basename 10/01/11 0151 09/30/11 1939  TROPONINI <0.30 <0.30   Hepatic Function Panel No results found for this basename: PROT,ALBUMIN,AST,ALT,ALKPHOS,BILITOT,BILIDIR,IBILI in the last 72 hours No results found for this basename: CHOL in the last 72 hours No results found for this basename: PROTIME in the last 72 hours  Imaging: Dg Chest Portable 1 View  09/30/2011  *RADIOLOGY REPORT*  Clinical Data: Loss of consciousness, irregular heart B  PORTABLE CHEST - 1 VIEW  Comparison: Chest radiograph 08/18/2011  Findings: Left-sided pacemaker with single obtuse lead overlies stable heart silhouette.  No effusion, infiltrate, or pneumothorax. Anterior cervical  fusion noted.  IMPRESSION: No acute cardiopulmonary process.   Original Report Authenticated By: Genevive Bi, M.D.     Cardiac Studies: Tele- atrial fib with intermittant ventricular pacing Assessment/Plan:  1. orthostasis causing syncope 2. Chest pain Rec: at this point, will give a fluid bolus and stop/change his meds. He has severe LV dysfunction but is also orthostatic. I will stop Bidil, switch coreg to toprol, add low dose losartan, and dc amio as he is now chronically in atrial fib.  LOS: 2 days    Lewayne Bunting, M.D. 10/02/2011, 9:24 AM

## 2011-10-02 NOTE — Progress Notes (Signed)
Utilization review complete 

## 2011-10-03 LAB — BASIC METABOLIC PANEL
Calcium: 9.8 mg/dL (ref 8.4–10.5)
GFR calc Af Amer: 45 mL/min — ABNORMAL LOW (ref >90)
GFR calc non Af Amer: 39 mL/min — ABNORMAL LOW (ref >90)
Potassium: 3.9 mEq/L (ref 3.5–5.1)
Sodium: 135 mEq/L (ref 135–145)

## 2011-10-03 LAB — PROTIME-INR: INR: 1.22 (ref 0.00–1.49)

## 2011-10-03 MED ORDER — METHYLPREDNISOLONE SODIUM SUCC 125 MG IJ SOLR
125.0000 mg | Freq: Once | INTRAMUSCULAR | Status: AC
  Administered 2011-10-03: 125 mg via INTRAVENOUS
  Filled 2011-10-03: qty 2

## 2011-10-03 NOTE — Progress Notes (Signed)
Patient ID: [name], male   DOB: [birth year], 61 y.o.   MRN: [id number] Subjective:  Complains of pain in his feet and ankles  Objective:  Vital Signs in the last 24 hours: Temp:  [97.8 F (36.6 C)-98.4 F (36.9 C)] 97.8 F (36.6 C) (10/01 0945) Pulse Rate:  [58-109] 109  (10/01 0945) Resp:  [18-19] 18  (10/01 0945) BP: (106-113)/(63-83) 113/83 mmHg (10/01 0945) SpO2:  [98 %-100 %] 98 % (10/01 0945) Weight:  [218 lb (98.884 kg)] 218 lb (98.884 kg) (10/01 0700)  Intake/Output from previous day: 09/30 0701 - 10/01 0700 In: 960 [P.O.:960] Out: 1950 [Urine:1950] Intake/Output from this shift: Total I/O In: 360 [P.O.:360] Out: 1500 [Urine:1500]  Physical Exam: Well appearing NAD HEENT: Unremarkable Neck:  No JVD, no thyromegally Lungs:  Clear with no wheezes, rales, rhonchi.  HEART:  Regular rate rhythm, no murmurs, no rubs, no clicks Abd:  soft, positive bowel sounds, no organomegally, no rebound, no guarding Ext:  2 plus pulses, no edema, no cyanosis, no clubbing tender over the lateral aspect of the left greater than right ankle Skin:  No rashes no nodules Neuro:  CN II through XII intact, motor grossly intact  Lab Results:  Basename 10/02/11 0640 09/30/11 1937  WBC 5.3 4.7  HGB 14.9 14.4  PLT 204 198    Basename 10/03/11 0005 10/02/11 0640  NA 135 137  K 3.9 3.8  CL 101 100  CO2 25 30  GLUCOSE 98 93  BUN 32* 34*  CREATININE 1.79* 1.79*    Basename 10/01/11 0151 09/30/11 1939  TROPONINI <0.30 <0.30   Hepatic Function Panel No results found for this basename: PROT,ALBUMIN,AST,ALT,ALKPHOS,BILITOT,BILIDIR,IBILI in the last 72 hours No results found for this basename: CHOL in the last 72 hours No results found for this basename: PROTIME in the last 72 hours  Imaging: No results found.  Cardiac Studies: Tele - Normal sinus rhythm Assessment/Plan:  1. Syncope 2. orthostasis 3. chronic systolic heart failure 4. Gout Rec: We'll continue his current  cardiac medications. He appears to be tolerating these without much in the way of symptoms. His big problem today is gout. We'll give some steroid injection to see if we can improve his symptoms.  LOS: 3 days    Lewayne Bunting M.D. 10/03/2011, 12:02 PM

## 2011-10-04 ENCOUNTER — Encounter (HOSPITAL_COMMUNITY): Payer: Self-pay | Admitting: Physician Assistant

## 2011-10-04 DIAGNOSIS — I420 Dilated cardiomyopathy: Secondary | ICD-10-CM | POA: Diagnosis present

## 2011-10-04 DIAGNOSIS — I951 Orthostatic hypotension: Secondary | ICD-10-CM | POA: Diagnosis present

## 2011-10-04 DIAGNOSIS — I4891 Unspecified atrial fibrillation: Secondary | ICD-10-CM | POA: Diagnosis present

## 2011-10-04 DIAGNOSIS — I5022 Chronic systolic (congestive) heart failure: Secondary | ICD-10-CM | POA: Diagnosis present

## 2011-10-04 DIAGNOSIS — I472 Ventricular tachycardia: Secondary | ICD-10-CM | POA: Diagnosis present

## 2011-10-04 DIAGNOSIS — R0789 Other chest pain: Secondary | ICD-10-CM | POA: Diagnosis present

## 2011-10-04 LAB — PROTIME-INR: Prothrombin Time: 17.2 seconds — ABNORMAL HIGH (ref 11.6–15.2)

## 2011-10-04 MED ORDER — METOPROLOL SUCCINATE ER 25 MG PO TB24: 25.0000 mg | ORAL_TABLET | Freq: Every day | ORAL | Status: DC

## 2011-10-04 MED ORDER — OXYCODONE-ACETAMINOPHEN 5-325 MG PO TABS: 1.0000 | ORAL_TABLET | Freq: Four times a day (QID) | ORAL | Status: DC | PRN

## 2011-10-04 MED ORDER — LOSARTAN POTASSIUM 25 MG PO TABS: 25.0000 mg | ORAL_TABLET | Freq: Every day | ORAL | Status: DC

## 2011-10-04 MED ORDER — METHYLPREDNISOLONE SODIUM SUCC 125 MG IJ SOLR
125.0000 mg | Freq: Once | INTRAMUSCULAR | Status: AC
  Administered 2011-10-04: 125 mg via INTRAVENOUS
  Filled 2011-10-04 (×2): qty 2

## 2011-10-04 MED ORDER — PREDNISONE (PAK) 10 MG PO TABS: 10.0000 mg | ORAL_TABLET | Freq: Every day | ORAL | Status: DC

## 2011-10-04 NOTE — ED Provider Notes (Signed)
I was present consultation and examined the patient in question during their ED stay and agree with the resident's documentation and resident's medical plan.  High-risk for ACS chest pain syncope patient admitted by cardiology. Stable  Jones Skene, MD   Jones Skene, MD 10/04/11 1244

## 2011-10-04 NOTE — Progress Notes (Signed)
Pt being discharged home with family.  All instructions and prescriptions given and reviewed, all questions answered.  Pt awaits ride at approx 1530.

## 2011-10-04 NOTE — Progress Notes (Signed)
Patient ID: [name], male   DOB: 1950/11/19, 61 y.o.   MRN: [id number]  Subjective:  Ankle pain is improved. He is now able to walk. No additional orthostasis.  Objective:  Vital Signs in the last 24 hours: Temp:  [97.8 F (36.6 C)-100.3 F (37.9 C)] 98.1 F (36.7 C) (10/02 0523) Pulse Rate:  [58-109] 74  (10/02 0523) Resp:  [18] 18  (10/02 0523) BP: (113-129)/(75-83) 116/75 mmHg (10/02 0523) SpO2:  [97 %-98 %] 97 % (10/02 0523)  Intake/Output from previous day: 10/01 0701 - 10/02 0700 In: 600 [P.O.:600] Out: 1701 [Urine:1700; Stool:1] Intake/Output from this shift:    Physical Exam: Well appearing NAD HEENT: Unremarkable Neck:  No JVD, no thyromegally Lungs:  Clear with no wheezes, rales, rhonchi.  HEART:  Regular rate rhythm, no murmurs, no rubs, no clicks Abd:  soft, positive bowel sounds, no organomegally, no rebound, no guarding Ext:  2 plus pulses, no edema, no cyanosis, no clubbing tender over the lateral aspect of the left greater than right ankle Skin:  No rashes no nodules Neuro:  CN II through XII intact, motor grossly intact  Lab Results:  Basename 10/02/11 0640  WBC 5.3  HGB 14.9  PLT 204    Basename 10/03/11 0005 10/02/11 0640  NA 135 137  K 3.9 3.8  CL 101 100  CO2 25 30  GLUCOSE 98 93  BUN 32* 34*  CREATININE 1.79* 1.79*   No results found for this basename: TROPONINI:2,CK,MB:2 in the last 72 hours Hepatic Function Panel No results found for this basename: PROT,ALBUMIN,AST,ALT,ALKPHOS,BILITOT,BILIDIR,IBILI in the last 72 hours No results found for this basename: CHOL in the last 72 hours No results found for this basename: PROTIME in the last 72 hours  Imaging: No results found.  Cardiac Studies: Tele - Normal sinus rhythm Assessment/Plan:  1. Syncope 2. orthostasis 3. chronic systolic heart failure 4. Gout Rec: We'll continue his current cardiac medications. He appears to be tolerating these without much in the way of  symptoms. His gout symptoms have improved. I will give him another dose of IV steroids today. We'll plan to send him home on a prednisone taper. This may also help his predilection to orthostasis.  LOS: 4 days    Lewayne Bunting M.D. 10/04/2011, 9:37 AM

## 2011-10-04 NOTE — Discharge Summary (Signed)
CARDIOLOGY DISCHARGE SUMMARY   Patient ID: [name] MRN: [id number] DOB/AGE: [birth year] 61 y.o.  Admit date: 09/30/2011 Discharge date: 10/04/2011  Primary Discharge Diagnosis:   Orthostatic syncope  Secondary Discharge Diagnosis:   HYPERTENSION  CHRONIC KIDNEY DISEASE STAGE II (MILD)  Chest pain, mid sternal  Chronic systolic CHF (congestive heart failure), NYHA class 2  Cardiomyopathy, dilated, nonischemic Paroxysmal VT Atrial fibrillation  Hospital Course: [name] is a 61 year old male with a history of nonischemic cardiomyopathy and CHF. He had 2 episodes of syncope, possibly orthostatic in nature. He came to the hospital where he was admitted for further evaluation and treatment.  He had chest pain in the setting of palpitations. He has a history of nonischemic cardiomyopathy and his cardiac enzymes were negative for MI. We suspected nonsustained VT and his ICD was interrogated. His ventricular tachycardia detection setting is greater than 200 beats per minute for 20 beats. On the histograms, his heart rate was in that range less than 1% of the time. There were no episodes of ventricular tachycardia detected. His telemetry was carefully reviewed. He had occasional PVCs and pairs, occasional longer runs were seen, up to 11 beats. This correlated with his palpitations. He is to follow up with EP as an outpatient.  Recording the orthostasis, he was hydrated with IV fluids and his diuretics were held. His orthostatic vital signs were followed. His systolic blood pressure with dropped into the 90s at times with standing. His hypertensive meds were adjusted. The orthostasis improved over the course of his hospital stay. His renal function was followed closely during his hospital stay. On admission, his BUN was 40 with a creatinine of 1.9. After holding his diuretics and hydration, this improved to a BUN of 32 and creatinine of 1.79.  He will follow up on this as an outpatient.  His weight at discharge was 98.8 kg.   [name] was in atrial fibrillation on admission. His rate was maintained with as needed pacing. His device was in VVI mode, set at 50. His device was detecting appropriately and pacing at times. With interrogation, no changes were made.  [name] had a gout flare and was having problems walking due to pain in his feet and ankles. This was treated with IV steroids. It improved some but did not completely resolve. He is to continue the steroids as an outpatient with an oral taper and follow up with primary care if needed.  By 10/04/2011, [name] was ambulating, with respirations at baseline and tolerating his gout symptoms. He was evaluated by [name] and considered stable for discharge, in improved condition, to follow up as an outpatient.  Labs:   Lab Results  Component Value Date   WBC 5.3 10/02/2011   HGB 14.9 10/02/2011   HCT 42.1 10/02/2011   MCV 88.6 10/02/2011   PLT 204 10/02/2011     Lab 10/03/11 0005  NA 135  K 3.9  CL 101  CO2 25  BUN 32*  CREATININE 1.79*  CALCIUM 9.8  PROT --  BILITOT --  ALKPHOS --  ALT --  AST --  GLUCOSE 98    Pro B Natriuretic peptide (BNP)  Date/Time Value Range Status  09/30/2011  7:39 PM 883.3* 0 - 125 pg/mL Final  08/05/2011  6:14 AM 949.2* 0 - 125 pg/mL Final    Basename 10/04/11 0550  INR 1.44      Radiology: Dg Chest Portable 1 View 09/30/2011  *RADIOLOGY REPORT*  Clinical Data: Loss  of consciousness, irregular heart B  PORTABLE CHEST - 1 VIEW  Comparison: Chest radiograph 08/18/2011  Findings: Left-sided pacemaker with single obtuse lead overlies stable heart silhouette.  No effusion, infiltrate, or pneumothorax. Anterior cervical fusion noted.  IMPRESSION: No acute cardiopulmonary process.   Original Report Authenticated By: Genevive Bi, M.D.    EKG: 01-Oct-2011 03:45:57  Atrial fibrillation Left axis deviation ST & T wave abnormality, consider lateral ischemia - no significant  change since 08/19/2011 Abnormal ECG 72mm/s 5mm/mV 100Hz  8.0.1 12SL 241 HD CID: 1 Referred by: Confirmed By: Orpah Cobb MD Vent. rate 63 BPM PR interval * ms QRS duration 116 ms QT/QTc 480/491 ms P-R-T axes * -60 156  FOLLOW UP PLANS AND APPOINTMENTS No Known Allergies   Medication List     As of 10/04/2011 11:18 AM    STOP taking these medications         amiodarone 200 MG tablet   Commonly known as: PACERONE      aspirin EC 81 MG tablet      carvedilol 3.125 MG tablet   Commonly known as: COREG      digoxin 0.25 MG tablet   Commonly known as: LANOXIN      furosemide 80 MG tablet   Commonly known as: LASIX      isosorbide-hydrALAZINE 20-37.5 MG per tablet   Commonly known as: BIDIL      metolazone 2.5 MG tablet   Commonly known as: ZAROXOLYN      potassium chloride SA 20 MEQ tablet   Commonly known as: K-DUR,KLOR-CON      TAKE these medications         allopurinol 100 MG tablet   Commonly known as: ZYLOPRIM   Take 100 mg by mouth daily.      colchicine 0.6 MG tablet   Take 0.6 mg by mouth daily as needed. For gout      losartan 25 MG tablet   Commonly known as: COZAAR   Take 1 tablet (25 mg total) by mouth daily.      methocarbamol 500 MG tablet   Commonly known as: ROBAXIN   Take 500 mg by mouth daily.      metoprolol succinate 25 MG 24 hr tablet   Commonly known as: TOPROL-XL   Take 1 tablet (25 mg total) by mouth daily.      pantoprazole 40 MG tablet   Commonly known as: PROTONIX   Take 40 mg by mouth daily at 12 noon.      predniSONE 10 MG tablet   Commonly known as: STERAPRED UNI-PAK   Take 1-6 tablets (10-60 mg total) by mouth daily. Take as directed.      rosuvastatin 5 MG tablet   Commonly known as: CRESTOR   Take 5 mg by mouth at bedtime.      sucralfate 1 G tablet   Commonly known as: CARAFATE   Take 1 g by mouth 4 (four) times daily.      warfarin 6 MG tablet   Commonly known as: COUMADIN   Take 6 mg by mouth every  evening.         Discharge Orders    Future Appointments: Provider: Department: Dept Phone: Center:   10/31/2011 11:00 AM Marinus Maw, MD [facility] [phone] [facility]     Future Orders Please Complete By Expires   Diet - low sodium heart healthy      Increase activity slowly      (HEART FAILURE PATIENTS)  Call MD:  Anytime you have any of the following symptoms: 1) 3 pound weight gain in 24 hours or 5 pounds in 1 week 2) shortness of breath, with or without a dry hacking cough 3) swelling in the hands, feet or stomach 4) if you have to sleep on extra pillows at night in order to breathe.        Follow-up Information    Follow up with Lewayne Bunting, MD. On 10/31/2011. (11:00 am)    Contact information:   [address] [place] [address] [phone]       Follow up with Robynn Pane, MD. Schedule an appointment as soon as possible for a visit in 1 week.   Contact information:   [address] [place] [address] [phone]          BRING ALL MEDICATIONS WITH YOU TO FOLLOW UP APPOINTMENTS  Time spent with patient to include physician time: 36 min Signed: Theodore Demark 10/04/2011, 11:18 AM Co-Sign MD

## 2011-10-07 ENCOUNTER — Emergency Department (HOSPITAL_COMMUNITY): Payer: PRIVATE HEALTH INSURANCE

## 2011-10-07 ENCOUNTER — Other Ambulatory Visit: Payer: Self-pay

## 2011-10-07 ENCOUNTER — Encounter (HOSPITAL_COMMUNITY): Payer: Self-pay | Admitting: *Deleted

## 2011-10-07 ENCOUNTER — Inpatient Hospital Stay (HOSPITAL_COMMUNITY)
Admission: EM | Admit: 2011-10-07 | Discharge: 2011-10-12 | DRG: 313 | Disposition: A | Discharge status: Home / Self Care | Payer: PRIVATE HEALTH INSURANCE | Attending: Cardiovascular Disease | Admitting: Cardiovascular Disease

## 2011-10-07 DIAGNOSIS — Z79899 Other long term (current) drug therapy: Secondary | ICD-10-CM

## 2011-10-07 DIAGNOSIS — K219 Gastro-esophageal reflux disease without esophagitis: Secondary | ICD-10-CM | POA: Diagnosis present

## 2011-10-07 DIAGNOSIS — Z95810 Presence of automatic (implantable) cardiac defibrillator: Secondary | ICD-10-CM

## 2011-10-07 DIAGNOSIS — M109 Gout, unspecified: Secondary | ICD-10-CM | POA: Diagnosis present

## 2011-10-07 DIAGNOSIS — I5023 Acute on chronic systolic (congestive) heart failure: Secondary | ICD-10-CM | POA: Diagnosis present

## 2011-10-07 DIAGNOSIS — I428 Other cardiomyopathies: Secondary | ICD-10-CM | POA: Diagnosis present

## 2011-10-07 DIAGNOSIS — I472 Ventricular tachycardia, unspecified: Secondary | ICD-10-CM | POA: Diagnosis present

## 2011-10-07 DIAGNOSIS — N184 Chronic kidney disease, stage 4 (severe): Secondary | ICD-10-CM | POA: Diagnosis present

## 2011-10-07 DIAGNOSIS — Z981 Arthrodesis status: Secondary | ICD-10-CM

## 2011-10-07 DIAGNOSIS — I4891 Unspecified atrial fibrillation: Secondary | ICD-10-CM | POA: Diagnosis present

## 2011-10-07 DIAGNOSIS — I2510 Atherosclerotic heart disease of native coronary artery without angina pectoris: Secondary | ICD-10-CM | POA: Diagnosis present

## 2011-10-07 DIAGNOSIS — I4729 Other ventricular tachycardia: Secondary | ICD-10-CM | POA: Diagnosis present

## 2011-10-07 DIAGNOSIS — I129 Hypertensive chronic kidney disease with stage 1 through stage 4 chronic kidney disease, or unspecified chronic kidney disease: Secondary | ICD-10-CM | POA: Diagnosis present

## 2011-10-07 DIAGNOSIS — I509 Heart failure, unspecified: Secondary | ICD-10-CM | POA: Diagnosis present

## 2011-10-07 DIAGNOSIS — D869 Sarcoidosis, unspecified: Secondary | ICD-10-CM | POA: Diagnosis present

## 2011-10-07 DIAGNOSIS — M542 Cervicalgia: Secondary | ICD-10-CM | POA: Diagnosis present

## 2011-10-07 DIAGNOSIS — R0789 Other chest pain: Principal | ICD-10-CM | POA: Diagnosis present

## 2011-10-07 DIAGNOSIS — Z7901 Long term (current) use of anticoagulants: Secondary | ICD-10-CM

## 2011-10-07 LAB — CBC WITH DIFFERENTIAL/PLATELET
Basophils Absolute: 0 10*3/uL (ref 0.0–0.1)
Eosinophils Relative: 5 % (ref 0–5)
HCT: 39.6 % (ref 39.0–52.0)
Lymphocytes Relative: 41 % (ref 12–46)
Lymphs Abs: 2.9 10*3/uL (ref 0.7–4.0)
MCV: 88 fL (ref 78.0–100.0)
Monocytes Absolute: 0.7 10*3/uL (ref 0.1–1.0)
Neutro Abs: 3.2 10*3/uL (ref 1.7–7.7)
RBC: 4.5 MIL/uL (ref 4.22–5.81)
WBC: 7.1 10*3/uL (ref 4.0–10.5)

## 2011-10-07 LAB — COMPREHENSIVE METABOLIC PANEL
ALT: 21 U/L (ref 0–53)
AST: 31 U/L (ref 0–37)
CO2: 23 mEq/L (ref 19–32)
Calcium: 9.4 mg/dL (ref 8.4–10.5)
Chloride: 103 mEq/L (ref 96–112)
Creatinine, Ser: 1.65 mg/dL — ABNORMAL HIGH (ref 0.50–1.35)
GFR calc Af Amer: 50 mL/min — ABNORMAL LOW (ref >90)
GFR calc non Af Amer: 43 mL/min — ABNORMAL LOW (ref >90)
Glucose, Bld: 111 mg/dL — ABNORMAL HIGH (ref 70–99)
Sodium: 136 mEq/L (ref 135–145)
Total Bilirubin: 0.3 mg/dL (ref 0.3–1.2)

## 2011-10-07 LAB — PROTIME-INR
INR: 1.23 (ref 0.00–1.49)
Prothrombin Time: 15.3 seconds — ABNORMAL HIGH (ref 11.6–15.2)

## 2011-10-07 LAB — CK TOTAL AND CKMB (NOT AT ARMC): CK, MB: 4.6 ng/mL — ABNORMAL HIGH (ref 0.3–4.0)

## 2011-10-07 MED ORDER — MORPHINE SULFATE 4 MG/ML IJ SOLN
4.0000 mg | Freq: Once | INTRAMUSCULAR | Status: AC
  Administered 2011-10-07: 4 mg via INTRAVENOUS
  Filled 2011-10-07: qty 1

## 2011-10-07 NOTE — ED Notes (Signed)
RN into reassess pt. Pt now c/o of weakness all over and a warm sensation all over. c-collar remains in place. MD made aware.
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
## 2011-10-07 NOTE — ED Notes (Signed)
Pt to xray

## 2011-10-08 ENCOUNTER — Encounter (HOSPITAL_COMMUNITY): Payer: Self-pay | Admitting: *Deleted

## 2011-10-08 LAB — HEPARIN LEVEL (UNFRACTIONATED): Heparin Unfractionated: 0.25 IU/mL — ABNORMAL LOW (ref 0.30–0.70)

## 2011-10-08 MED ORDER — ATORVASTATIN CALCIUM 40 MG PO TABS
40.0000 mg | ORAL_TABLET | Freq: Every day | ORAL | Status: DC
  Administered 2011-10-08 – 2011-10-11 (×4): 40 mg via ORAL
  Filled 2011-10-08 (×5): qty 1

## 2011-10-08 MED ORDER — ASPIRIN EC 81 MG PO TBEC
81.0000 mg | DELAYED_RELEASE_TABLET | Freq: Every day | ORAL | Status: DC
  Administered 2011-10-09 – 2011-10-12 (×4): 81 mg via ORAL
  Filled 2011-10-08 (×4): qty 1

## 2011-10-08 MED ORDER — SODIUM CHLORIDE 0.9 % IJ SOLN
3.0000 mL | Freq: Two times a day (BID) | INTRAMUSCULAR | Status: DC
Start: 2011-10-08 — End: 2011-10-12
  Administered 2011-10-08 – 2011-10-12 (×5): 3 mL via INTRAVENOUS

## 2011-10-08 MED ORDER — LOSARTAN POTASSIUM 25 MG PO TABS
25.0000 mg | ORAL_TABLET | Freq: Every day | ORAL | Status: DC
  Administered 2011-10-08 – 2011-10-12 (×5): 25 mg via ORAL
  Filled 2011-10-08 (×6): qty 1

## 2011-10-08 MED ORDER — NITROGLYCERIN 0.4 MG SL SUBL: 0.4000 mg | SUBLINGUAL_TABLET | SUBLINGUAL | Status: DC | PRN

## 2011-10-08 MED ORDER — WARFARIN SODIUM 10 MG PO TABS
10.0000 mg | ORAL_TABLET | Freq: Every day | ORAL | Status: DC
  Administered 2011-10-08 – 2011-10-11 (×4): 10 mg via ORAL
  Filled 2011-10-08 (×5): qty 1

## 2011-10-08 MED ORDER — ACETAMINOPHEN 325 MG PO TABS: 650.0000 mg | ORAL_TABLET | ORAL | Status: DC | PRN

## 2011-10-08 MED ORDER — ASPIRIN 300 MG RE SUPP
300.0000 mg | RECTAL | Status: AC
  Filled 2011-10-08: qty 1

## 2011-10-08 MED ORDER — ONDANSETRON HCL 4 MG/2ML IJ SOLN: 4.0000 mg | Freq: Four times a day (QID) | INTRAMUSCULAR | Status: DC | PRN

## 2011-10-08 MED ORDER — METOPROLOL SUCCINATE ER 25 MG PO TB24
25.0000 mg | ORAL_TABLET | Freq: Every day | ORAL | Status: DC
  Administered 2011-10-08 – 2011-10-12 (×5): 25 mg via ORAL
  Filled 2011-10-08 (×5): qty 1

## 2011-10-08 MED ORDER — ASPIRIN 81 MG PO CHEW
324.0000 mg | CHEWABLE_TABLET | ORAL | Status: AC
  Administered 2011-10-08: 324 mg via ORAL

## 2011-10-08 MED ORDER — FENTANYL CITRATE 0.05 MG/ML IJ SOLN
50.0000 ug | Freq: Once | INTRAMUSCULAR | Status: AC
  Administered 2011-10-08: 50 ug via INTRAVENOUS
  Filled 2011-10-08: qty 2

## 2011-10-08 MED ORDER — COLCHICINE 0.6 MG PO TABS
0.6000 mg | ORAL_TABLET | Freq: Every day | ORAL | Status: DC
  Administered 2011-10-08 – 2011-10-12 (×5): 0.6 mg via ORAL
  Filled 2011-10-08 (×5): qty 1

## 2011-10-08 MED ORDER — WARFARIN - PHYSICIAN DOSING INPATIENT
Freq: Every day | Status: DC
  Administered 2011-10-08: 18:00:00

## 2011-10-08 MED ORDER — METHOCARBAMOL 500 MG PO TABS
500.0000 mg | ORAL_TABLET | Freq: Two times a day (BID) | ORAL | Status: DC | PRN
  Filled 2011-10-08: qty 1

## 2011-10-08 MED ORDER — SODIUM CHLORIDE 0.9 % IV SOLN: 250.0000 mL | INTRAVENOUS | Status: DC | PRN

## 2011-10-08 MED ORDER — HEPARIN (PORCINE) IN NACL 100-0.45 UNIT/ML-% IJ SOLN
1150.0000 [IU]/h | INTRAMUSCULAR | Status: DC
  Administered 2011-10-08: 1150 [IU]/h via INTRAVENOUS
  Administered 2011-10-08: 1000 [IU]/h via INTRAVENOUS
  Administered 2011-10-09 – 2011-10-11 (×3): 1150 [IU]/h via INTRAVENOUS
  Filled 2011-10-08 (×8): qty 250

## 2011-10-08 MED ORDER — PANTOPRAZOLE SODIUM 40 MG PO TBEC
40.0000 mg | DELAYED_RELEASE_TABLET | Freq: Every day | ORAL | Status: DC
  Administered 2011-10-08 – 2011-10-09 (×2): 40 mg via ORAL
  Filled 2011-10-08: qty 1

## 2011-10-08 MED ORDER — ALLOPURINOL 100 MG PO TABS
100.0000 mg | ORAL_TABLET | Freq: Every day | ORAL | Status: DC
  Administered 2011-10-08 – 2011-10-12 (×5): 100 mg via ORAL
  Filled 2011-10-08 (×5): qty 1

## 2011-10-08 MED ORDER — SODIUM CHLORIDE 0.9 % IJ SOLN: 3.0000 mL | INTRAMUSCULAR | Status: DC | PRN

## 2011-10-08 MED ORDER — OXYCODONE-ACETAMINOPHEN 5-325 MG PO TABS
1.0000 | ORAL_TABLET | Freq: Four times a day (QID) | ORAL | Status: DC | PRN
  Administered 2011-10-08 – 2011-10-12 (×4): 1 via ORAL
  Filled 2011-10-08 (×4): qty 1

## 2011-10-08 MED ORDER — HEPARIN BOLUS VIA INFUSION
2000.0000 [IU] | Freq: Once | INTRAVENOUS | Status: AC
  Administered 2011-10-08: 2000 [IU] via INTRAVENOUS
  Filled 2011-10-08: qty 2000

## 2011-10-08 NOTE — ED Notes (Signed)
Pt returned from xray

## 2011-10-08 NOTE — ED Notes (Signed)
Cardiology paged for pt. Admit.  

## 2011-10-08 NOTE — Progress Notes (Addendum)
ANTICOAGULATION CONSULT NOTE - Initial Consult  Pharmacy Consult for Heparin Indication: chest pain/ACS and hx Afib with SUBtherapeutic INR  No Known Allergies  Patient Measurements: Height: 6\' 3"  (190.5 cm) Weight: 223 lb 1.7 oz (101.2 kg) IBW/kg (Calculated) : 84.5  Heparin Dosing Weight: 101.2 kg Vital Signs: Temp: 97.7 F (36.5 C) (10/06 1120) Temp src: Oral (10/06 1120) BP: 117/85 mmHg (10/06 1120) Pulse Rate: 67  (10/06 1120)  Labs:  Basename 10/07/11 2228 10/07/11 2218  HGB 13.7 --  HCT 39.6 --  PLT 234 --  APTT -- --  LABPROT 15.3* --  INR 1.23 --  HEPARINUNFRC -- --  CREATININE 1.65* --  CKTOTAL -- 119  CKMB -- 4.6*  TROPONINI -- <0.30    Estimated Creatinine Clearance: 56.2 ml/min (by C-G formula based on Cr of 1.65).   Medical History: Past Medical History  Diagnosis Date  . CHF (congestive heart failure)   . Sarcoidosis   . Cardiomyopathy, dilated, nonischemic     non ischemic by cath  . Acute on chronic systolic heart failure   . Automatic implantable cardiac defibrillator in situ   . Atrial fibrillation   . NSVT (nonsustained ventricular tachycardia)   . GERD (gastroesophageal reflux disease)   . Hypercholesteremia   . Myocardial infarction   . Shortness of breath   . Chronic kidney disease (CKD), stage III (moderate)   . Pacemaker   . Anginal pain   . Gout   . Hypertension     [name]  . Coronary artery disease     Assessment: 61 y.o. M who [facility] to the [facility] on 10[facility]5 with CP. Pharmacy has been consulted to start heparin while awaiting further cardiac evaluation. Of noting, the patient was on warfarin PTA for hx Afib and was admitted with a SUBtherapeutic INR of 1.23. The physician has resumed the patient's home warfarin dose -- which is noted to be 10 mg daily. Hgb/Hc/Plt ok. No s/sx of bleeding noted. Heparin dosing weight~101.2 kg  The patient is noted to have recent surgery on 08/21/11 for an anterior cervical  decompression/discectomy fusion. Will only give a small bolus and will start at a drip rate known to achieve therapeutic levels for this patient.  Goal of Therapy:  Heparin level 0.3-0.7 units/ml Monitor platelets by anticoagulation protocol: Yes   Plan:  1. Heparin bolus of 2000 units x 1 2. Initiate heparin drip at rate of 1000 units/hr (10 ml/hr) 3. Will continue to monitor for any signs/symptoms of bleeding and will follow up with heparin level in 6 hours   Georgina Pillion, PharmD, BCPS Clinical Pharmacist Pager: [phone] 10/08/2011 12:02 PM     ----------------------------------------------------------------------- Addendum:  Heparin level this evening is slightly SUBtherapeutic (HL 0.25, goal of 0.3-0.7). No s/sx of bleeding noted.  Plan: 1. Increase heparin drip rate to 1150 units/hr (11.5 ml/hr) 2. Will continue to monitor for any signs/symptoms of bleeding and will follow up with heparin level in 6 hours   Georgina Pillion, PharmD, BCPS Clinical Pharmacist Pager: [phone] 10/08/2011 9:21 PM
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
## 2011-10-08 NOTE — ED Notes (Signed)
Cardiology paged for pt. Admit.

## 2011-10-09 ENCOUNTER — Other Ambulatory Visit: Payer: Self-pay

## 2011-10-09 LAB — LIPID PANEL
Cholesterol: 144 mg/dL (ref 0–200)
HDL: 70 mg/dL (ref >39)
LDL Cholesterol: 55 mg/dL (ref 0–99)
Triglycerides: 96 mg/dL (ref <150)
VLDL: 19 mg/dL (ref 0–40)

## 2011-10-09 LAB — BASIC METABOLIC PANEL
Chloride: 108 mEq/L (ref 96–112)
GFR calc Af Amer: 56 mL/min — ABNORMAL LOW (ref >90)
GFR calc non Af Amer: 49 mL/min — ABNORMAL LOW (ref >90)
Potassium: 3.7 mEq/L (ref 3.5–5.1)

## 2011-10-09 LAB — CBC
HCT: 37.4 % — ABNORMAL LOW (ref 39.0–52.0)
Hemoglobin: 12.8 g/dL — ABNORMAL LOW (ref 13.0–17.0)
RDW: 12.9 % (ref 11.5–15.5)
WBC: 6.4 10*3/uL (ref 4.0–10.5)

## 2011-10-09 LAB — HEPARIN LEVEL (UNFRACTIONATED)
Heparin Unfractionated: 0.41 IU/mL (ref 0.30–0.70)
Heparin Unfractionated: 0.55 IU/mL (ref 0.30–0.70)

## 2011-10-09 MED ORDER — SPIRONOLACTONE 25 MG PO TABS
25.0000 mg | ORAL_TABLET | Freq: Every day | ORAL | Status: DC
  Administered 2011-10-09 – 2011-10-12 (×4): 25 mg via ORAL
  Filled 2011-10-09 (×4): qty 1

## 2011-10-09 NOTE — Progress Notes (Signed)
Subjective:  Patient complains of shortness of breath denies anginal chest pain.  Objective:  Vital Signs in the last 24 hours: Temp:  [97.8 F (36.6 C)-98.5 F (36.9 C)] 97.8 F (36.6 C) (10/07 1350) Pulse Rate:  [61-77] 66  (10/07 1350) Resp:  [16-18] 18  (10/07 1350) BP: (112-127)/(62-82) 112/78 mmHg (10/07 1350) SpO2:  [95 %-100 %] 100 % (10/07 1350) Weight:  [101.3 kg (223 lb 5.2 oz)] 101.3 kg (223 lb 5.2 oz) (10/07 0507)  Intake/Output from previous day: 10/06 0701 - 10/07 0700 In: 780 [P.O.:780] Out: 1150 [Urine:1150] Intake/Output from this shift: Total I/O In: 480 [P.O.:480] Out: 475 [Urine:475]  Physical Exam: Neck: no adenopathy, no carotid bruit, no JVD and supple, symmetrical, trachea midline Lungs: Decreased breath sound with bibasilar Rales noted Heart: irregularly irregular rhythm, S1, S2 normal and Soft systolic murmur and S3 gallop noted Abdomen: soft, non-tender; bowel sounds normal; no masses,  no organomegaly Extremities: extremities normal, atraumatic, no cyanosis or edema  Lab Results:  Basename 10/09/11 0600 10/07/11 2228  WBC 6.4 7.1  HGB 12.8* 13.7  PLT 208 234    Basename 10/09/11 0600 10/07/11 2228  NA 139 136  K 3.7 3.8  CL 108 103  CO2 23 23  GLUCOSE 87 111*  BUN 23 32*  CREATININE 1.50* 1.65*    Basename 10/07/11 2218  TROPONINI <0.30   Hepatic Function Panel  Basename 10/07/11 2228  PROT 7.0  ALBUMIN 2.9*  AST 31  ALT 21  ALKPHOS 76  BILITOT 0.3  BILIDIR --  IBILI --    Basename 10/09/11 0600  CHOL 144   No results found for this basename: PROTIME in the last 72 hours  Imaging: Imaging results have been reviewed and Dg Chest 2 View  10/07/2011  *RADIOLOGY REPORT*  Clinical Data: Chest pain and shortness of breath.  History of sarcoidosis.  CHEST - 2 VIEW  Comparison: 09/30/2011.  Findings: The cardiac silhouette remains borderline enlarged. Stable left subclavian AICD lead.  Clear lungs with normal vascularity.   Cervical spine fixation hardware.  Thoracic spine degenerative changes.  IMPRESSION: No acute abnormality.   Original Report Authenticated By: Darrol Angel, M.D.    Ct Head Wo Contrast  10/08/2011  *RADIOLOGY REPORT*  Clinical Data:  ASSAULT VICTIM CHEST PAIN SHORTNESS OF BREATH.  CT HEAD WITHOUT CONTRAST CT CERVICAL SPINE WITHOUT CONTRAST  Technique:  Multidetector CT imaging of the head and cervical spine was performed following the standard protocol without IV contrast. Multiplanar CT image reconstructions of the cervical spine were also generated.  Comparison: 07/24/2011 and earlier studies  CT HEAD  Findings: Atherosclerotic and physiologic intracranial calcifications.  Bilateral basal ganglia mineralization.  Focal cortical hypoattenuation in the left occipital lobe is new since previous exam.  Negative for acute intracranial hemorrhage, midline shift, mass, or mass effect.  The ventricles and sulci remain normal in size and symmetry.  IMPRESSION: 1.  Negative for acute intracranial hemorrhage or fracture. 2.  Focal cortical hypoattenuation in the left occipital lobe may represent small subacute   infarct or other lesion, new since previous exam.  Consider elective MRI with contrast for further characterization.  CT CERVICAL SPINE  Findings: Reversal of the normal cervical lordosis.  Interval instrumented ACDF C5-C7, hardware intact without surrounding lucency.  Graft in the interspaces appears well positioned.  Stable partially calcified protrusion C3-4.  Facets seated.  Stable degenerative disc disease C7-T1.  Negative for fracture. No prevertebral soft tissue swelling.  IMPRESSION:  1.  Negative for fracture or other acute bony abnormality. 2.  Interval ACDF C5-C7 without apparent complication. 3.  Degenerative changes C3-4 and C7-C1 as above. 4. Loss of the normal cervical spine lordosis, which may be secondary to positioning, spasm, or soft tissue injury.   Original Report Authenticated By: Osa Craver, M.D.    Ct Cervical Spine Wo Contrast  10/08/2011  *RADIOLOGY REPORT*  Clinical Data:  ASSAULT VICTIM CHEST PAIN SHORTNESS OF BREATH.  CT HEAD WITHOUT CONTRAST CT CERVICAL SPINE WITHOUT CONTRAST  Technique:  Multidetector CT imaging of the head and cervical spine was performed following the standard protocol without IV contrast. Multiplanar CT image reconstructions of the cervical spine were also generated.  Comparison: 07/24/2011 and earlier studies  CT HEAD  Findings: Atherosclerotic and physiologic intracranial calcifications.  Bilateral basal ganglia mineralization.  Focal cortical hypoattenuation in the left occipital lobe is new since previous exam.  Negative for acute intracranial hemorrhage, midline shift, mass, or mass effect.  The ventricles and sulci remain normal in size and symmetry.  IMPRESSION: 1.  Negative for acute intracranial hemorrhage or fracture. 2.  Focal cortical hypoattenuation in the left occipital lobe may represent small subacute   infarct or other lesion, new since previous exam.  Consider elective MRI with contrast for further characterization.  CT CERVICAL SPINE  Findings: Reversal of the normal cervical lordosis.  Interval instrumented ACDF C5-C7, hardware intact without surrounding lucency.  Graft in the interspaces appears well positioned.  Stable partially calcified protrusion C3-4.  Facets seated.  Stable degenerative disc disease C7-T1.  Negative for fracture. No prevertebral soft tissue swelling.  IMPRESSION:  1.  Negative for fracture or other acute bony abnormality. 2.  Interval ACDF C5-C7 without apparent complication. 3.  Degenerative changes C3-4 and C7-C1 as above. 4. Loss of the normal cervical spine lordosis, which may be secondary to positioning, spasm, or soft tissue injury.   Original Report Authenticated By: Thora Lance III, M.D.    Dg Foot Complete Left  10/07/2011  *RADIOLOGY REPORT*  Clinical Data: Left foot pain and swelling.  History of  gout.  LEFT FOOT - COMPLETE 3+ VIEW  Comparison: None.  Findings: Small erosions in the distal aspect of the first metatarsal head.  No soft tissue calcifications are seen.  Mild inferior calcaneal spur formation.  IMPRESSION:  1.  No acute abnormality. 2.  Small erosions in the first metatarsal head, compatible with the history of gout.   Original Report Authenticated By: Darrol Angel, M.D.     Cardiac Studies:  Assessment/Plan:  Status post atypical chest pain Decompensated systolic heart failure Nonischemic cardiomyopathy Chronic atrial fibrillation Cervical disc disease Probable subacute occipital infarct History of sarcoidosis Subtherapeutic INR Operative and Chronic kidney disease stage IV improved Plan as per orders  LOS: 2 days    Shoni Quijas N 10/09/2011, 2:34 PM

## 2011-10-09 NOTE — Progress Notes (Signed)
ANTICOAGULATION CONSULT NOTE - Follow Up Consult  Pharmacy Consult for Heparin Indication: chest pain/ACS and hx Afib with SUBtherapeutic INR  No Known Allergies  Patient Measurements: Height: 6\' 3"  (190.5 cm) Weight: 223 lb 5.2 oz (101.3 kg) (scale c) IBW/kg (Calculated) : 84.5  Heparin Dosing Weight: 101.2 kg Vital Signs: Temp: 98.1 F (36.7 C) (10/07 1021) Temp src: Oral (10/07 1021) BP: 116/81 mmHg (10/07 1021) Pulse Rate: 77  (10/07 1021)  Labs:  Basename 10/09/11 1136 10/09/11 0600 10/08/11 2042 10/07/11 2228 10/07/11 2218  HGB -- 12.8* -- 13.7 --  HCT -- 37.4* -- 39.6 --  PLT -- 208 -- 234 --  APTT -- -- -- -- --  LABPROT -- 15.5* -- 15.3* --  INR -- 1.25 -- 1.23 --  HEPARINUNFRC 0.55 0.41 0.25* -- --  CREATININE -- 1.50* -- 1.65* --  CKTOTAL -- -- -- -- 119  CKMB -- -- -- -- 4.6*  TROPONINI -- -- -- -- <0.30    Estimated Creatinine Clearance: 61.8 ml/min (by C-G formula based on Cr of 1.5).   Assessment: 60 y.o. M who [facility] to [facility]5 with CP. Pharmacy has been consulted to start heparin while awaiting further cardiac evaluation. Of noting, the patient was on warfarin PTA for hx Afib and was admitted with a SUBtherapeutic INR of 1.23. The patient is currently taking coumadin at his home dose per MD. A recheck heparin level (0.55) for today is at-goal on 1150 units/hr. INR today is 1.25.  Goal of Therapy:  Heparin level 0.3-0.7 units/ml Monitor platelets by anticoagulation protocol: Yes   Plan:  -No heparin changes needed -Daily CBC and heparin level  Harland German, Pharm D 10/09/2011 12:44 PM

## 2011-10-09 NOTE — Progress Notes (Signed)
ANTICOAGULATION CONSULT NOTE - Follow Up Consult  Pharmacy Consult for Heparin Indication: chest pain/ACS and hx Afib with SUBtherapeutic INR  No Known Allergies  Patient Measurements: Height: 6\' 3"  (190.5 cm) Weight: 223 lb 5.2 oz (101.3 kg) (scale c) IBW/kg (Calculated) : 84.5  Heparin Dosing Weight: 101.2 kg Vital Signs: Temp: 97.9 F (36.6 C) (10/07 0507) Temp src: Oral (10/07 0507) BP: 115/62 mmHg (10/07 0507) Pulse Rate: 61  (10/07 0507)  Labs:  Basename 10/09/11 0600 10/08/11 2042 10/07/11 2228 10/07/11 2218  HGB 12.8* -- 13.7 --  HCT 37.4* -- 39.6 --  PLT 208 -- 234 --  APTT -- -- -- --  LABPROT 15.5* -- 15.3* --  INR 1.25 -- 1.23 --  HEPARINUNFRC 0.41 0.25* -- --  CREATININE -- -- 1.65* --  CKTOTAL -- -- -- 119  CKMB -- -- -- 4.6*  TROPONINI -- -- -- <0.30    Estimated Creatinine Clearance: 56.2 ml/min (by C-G formula based on Cr of 1.65).   Medical History: Past Medical History  Diagnosis Date  . CHF (congestive heart failure)   . Sarcoidosis   . Cardiomyopathy, dilated, nonischemic     non ischemic by cath  . Acute on chronic systolic heart failure   . Automatic implantable cardiac defibrillator in situ   . Atrial fibrillation   . NSVT (nonsustained ventricular tachycardia)   . GERD (gastroesophageal reflux disease)   . Hypercholesteremia   . Myocardial infarction   . Shortness of breath   . Chronic kidney disease (CKD), stage III (moderate)   . Pacemaker   . Anginal pain   . Gout   . Hypertension     [name]  . Coronary artery disease     Assessment: 61 y.o. M who [facility] to the [facility] on [facility]5 with CP. Pharmacy has been consulted to start heparin while awaiting further cardiac evaluation. Of noting, the patient was on warfarin PTA for hx Afib and was admitted with a SUBtherapeutic INR of 1.23. The physician has resumed the patient's home warfarin dose -- which is noted to be 10 mg daily. Heparin level (0.41) is at-goal on 1150 units/hr.     Goal of Therapy:  Heparin level 0.3-0.7 units/ml Monitor platelets by anticoagulation protocol: Yes   Plan:  1. Continue IV heparin at 1150 units/hr.  2. Confirmatory heparin level in 6  Lorre Munroe, PharmD 10/09/2011 6:48 AM

## 2011-10-10 ENCOUNTER — Other Ambulatory Visit: Payer: Self-pay

## 2011-10-10 LAB — HEPARIN LEVEL (UNFRACTIONATED): Heparin Unfractionated: 0.58 IU/mL (ref 0.30–0.70)

## 2011-10-10 LAB — BASIC METABOLIC PANEL
CO2: 24 mEq/L (ref 19–32)
Chloride: 109 mEq/L (ref 96–112)
Creatinine, Ser: 1.57 mg/dL — ABNORMAL HIGH (ref 0.50–1.35)
GFR calc Af Amer: 53 mL/min — ABNORMAL LOW (ref >90)
Potassium: 3.7 mEq/L (ref 3.5–5.1)

## 2011-10-10 LAB — PROTIME-INR: INR: 1.34 (ref 0.00–1.49)

## 2011-10-10 LAB — CBC
HCT: 36.7 % — ABNORMAL LOW (ref 39.0–52.0)
Hemoglobin: 12.7 g/dL — ABNORMAL LOW (ref 13.0–17.0)
MCV: 88.4 fL (ref 78.0–100.0)
RDW: 13.1 % (ref 11.5–15.5)
WBC: 6.4 10*3/uL (ref 4.0–10.5)

## 2011-10-10 LAB — PRO B NATRIURETIC PEPTIDE: Pro B Natriuretic peptide (BNP): 9821 pg/mL — ABNORMAL HIGH (ref 0–125)

## 2011-10-10 LAB — CK TOTAL AND CKMB (NOT AT ARMC)
CK, MB: 2.6 ng/mL (ref 0.3–4.0)
Relative Index: INVALID (ref 0.0–2.5)
Total CK: 50 U/L (ref 7–232)

## 2011-10-10 LAB — TROPONIN I: Troponin I: <0.3 ng/mL (ref <0.30)

## 2011-10-10 MED ORDER — PANTOPRAZOLE SODIUM 40 MG PO TBEC
40.0000 mg | DELAYED_RELEASE_TABLET | Freq: Two times a day (BID) | ORAL | Status: DC
  Administered 2011-10-10 – 2011-10-12 (×6): 40 mg via ORAL
  Filled 2011-10-10 (×7): qty 1

## 2011-10-10 MED ORDER — ZOLPIDEM TARTRATE 5 MG PO TABS
5.0000 mg | ORAL_TABLET | Freq: Every evening | ORAL | Status: DC | PRN
  Administered 2011-10-10: 5 mg via ORAL
  Filled 2011-10-10: qty 1

## 2011-10-10 NOTE — Clinical Documentation Improvement (Signed)
GENERIC DOCUMENTATION CLARIFICATION QUERY  THIS DOCUMENT IS NOT A PERMANENT PART OF THE MEDICAL RECORD  Please update your documentation within the medical record to reflect your response to this query.                                                                                         10/10/11   Dr. [name] and/or Associates,  In a better effort to capture your patient's severity of illness, reflect appropriate length of stay and utilization of resources, a review of the patient medical record has revealed the following information:  Past Medical History: Acute on Chronic Systolic Heart Failure  "Decompensated systolic heart failure" [name],[name]  10/09/2011, 2:34 PM  CHEST - 2 VIEW   10/07/11 Comparison: 09/30/2011.  Findings: The cardiac silhouette remains borderline enlarged. Stable left subclavian AICD lead. Clear lungs with normal  vascularity. Cervical spine fixation hardware. Thoracic spine degenerative changes.  IMPRESSION:  No acute abnormality.  Original Report Authenticated By: Darrol Angel, M.D.  "Cardiovascular: Irregularly irregular S1 and S2, soft systolic murmur.  Respiratory: Decreased breath sound at bases.  Assessment/Plan  Chest pain  Cervical disc disease  Dilated cardiomyopathy  Hypertension  Sarcoidosis  S/P ICD  Sub therapeutic INR Admit/Monitor/R/O MI"  KADAKIA,AJAY S  10/08/2011, 8:34 AM H&P   Based on your clinical judgment, please clarify and document the STATUS of the patient's Heart Failure this admission in the progress notes and discharge summary:   - Acute on Chronic Systolic Heart Failure, present on admission   - Chronic Systolic Heart Failure without acute decompensation this admission   In responding to this query please exercise your independent judgment.    The fact that a query is asked, does not imply that any particular answer is desired or expected.    Reviewed: 10/11/11 - "decompensated systolic hf improved"  documented by dr. [name] pn 10/11/11.   [name] RN   Thank You,  Darrell Ralph  RN BSN CCDS Certified Clinical Documentation Specialist: Cell   [phone]  Health Information Management [place]  TO RESPOND TO THE THIS QUERY, FOLLOW THE INSTRUCTIONS BELOW:  1. If needed, update documentation for the patient's encounter via the notes activity.  2. Access this query again and click edit on the In [employer].  3. After updating, or not, click F2 to complete all highlighted (required) fields concerning your review. Select "additional documentation in the medical record" OR "no additional documentation provided".  4. Click Sign note button.  5. The deficiency will fall out of your In Basket *Please let us know if you are not able to complete this workflow by phone or e-mail (listed below).

## 2011-10-10 NOTE — Progress Notes (Signed)
Notified by monitor tech that pt had 10 beat run of Vtach. Assessed pt and verified strip. Pt c/o lightheadedness and dizziness. VS WNL. Notified MD. MD ordered Mg level and BMET scheduled for tomorrow morning. Will continue to monitor pt closely.  Juliane Lack, RN

## 2011-10-10 NOTE — Progress Notes (Signed)
ANTICOAGULATION CONSULT NOTE - Follow Up Consult  Pharmacy Consult for Heparin Indication: chest pain/ACS and hx Afib with SUBtherapeutic INR  No Known Allergies  Patient Measurements: Height: 6\' 3"  (190.5 cm) Weight: 224 lb 3.3 oz (101.7 kg) (scale C) IBW/kg (Calculated) : 84.5  Heparin Dosing Weight: 101.2 kg Vital Signs: Temp: 98.7 F (37.1 C) (10/08 0531) Temp src: Oral (10/08 0531) BP: 125/90 mmHg (10/08 0531) Pulse Rate: 48  (10/08 0531)  Labs:  Basename 10/10/11 0515 10/09/11 1136 10/09/11 0600 10/07/11 2228 10/07/11 2218  HGB 12.7* -- 12.8* -- --  HCT 36.7* -- 37.4* 39.6 --  PLT 223 -- 208 234 --  APTT -- -- -- -- --  LABPROT 16.3* -- 15.5* 15.3* --  INR 1.34 -- 1.25 1.23 --  HEPARINUNFRC 0.58 0.55 0.41 -- --  CREATININE 1.57* -- 1.50* 1.65* --  CKTOTAL 50 -- -- -- 119  CKMB 2.6 -- -- -- 4.6*  TROPONINI <0.30 -- -- -- <0.30    Estimated Creatinine Clearance: 63.9 ml/min (by C-G formula based on Cr of 1.57).   Assessment: 61 y.o. M who [facility] with CP. Pharmacy has been consulted to start heparin while awaiting further cardiac evaluation. Of noting, the patient was on warfarin PTA for hx Afib and was admitted with a SUBtherapeutic INR of 1.23. The patient is currently taking coumadin at his home dose per MD. Heparin level (0.34) for today is at-goal on 1150 units/hr. INR today is 1.25.  Goal of Therapy:  Heparin level 0.3-0.7 units/ml Monitor platelets by anticoagulation protocol: Yes   Plan:  -No heparin changes needed -Daily CBC and heparin level  Harland German, Pharm D 10/10/2011 8:54 AM

## 2011-10-10 NOTE — Progress Notes (Signed)
Subjective:  Patient denies any chest pain or shortness of breath. States feels little better. INR remained subtherapeutic. Tolerating heparin okay  Objective:  Vital Signs in the last 24 hours: Temp:  [97.8 F (36.6 C)-98.7 F (37.1 C)] 97.8 F (36.6 C) (10/08 0955) Pulse Rate:  [48-69] 69  (10/08 0955) Resp:  [18] 18  (10/08 0955) BP: (112-134)/(52-90) 119/82 mmHg (10/08 0955) SpO2:  [98 %-100 %] 99 % (10/08 0955) Weight:  [101.7 kg (224 lb 3.3 oz)] 101.7 kg (224 lb 3.3 oz) (10/08 0531)  Intake/Output from previous day: 10/07 0701 - 10/08 0700 In: 1313.4 [P.O.:840; I.V.:473.4] Out: 1275 [Urine:1275] Intake/Output from this shift: Total I/O In: 120 [P.O.:120] Out: 225 [Urine:225]  Physical Exam: Neck: no adenopathy, no carotid bruit, no JVD and supple, symmetrical, trachea midline Lungs: Decreased breath sound at bases air entry improved Heart: irregularly irregular rhythm, S1, S2 normal and Soft systolic murmur and S3 gallop noted Abdomen: soft, non-tender; bowel sounds normal; no masses,  no organomegaly Extremities: extremities normal, atraumatic, no cyanosis or edema  Lab Results:  Basename 10/10/11 0515 10/09/11 0600  WBC 6.4 6.4  HGB 12.7* 12.8*  PLT 223 208    Basename 10/10/11 0515 10/09/11 0600  NA 140 139  K 3.7 3.7  CL 109 108  CO2 24 23  GLUCOSE 96 87  BUN 23 23  CREATININE 1.57* 1.50*    Basename 10/10/11 0515 10/07/11 2218  TROPONINI <0.30 <0.30   Hepatic Function Panel  Basename 10/07/11 2228  PROT 7.0  ALBUMIN 2.9*  AST 31  ALT 21  ALKPHOS 76  BILITOT 0.3  BILIDIR --  IBILI --    Basename 10/09/11 0600  CHOL 144   No results found for this basename: PROTIME in the last 72 hours  Imaging: Imaging results have been reviewed and No results found.  Cardiac Studies:  Assessment/Plan:  Status post atypical chest pain  Decompensated systolic heart failure  Nonischemic cardiomyopathy  Chronic atrial fibrillation  Cervical disc  disease  Probable subacute occipital infarct  History of sarcoidosis  Subtherapeutic INR  Gouty arthritis Chronic kidney disease stage IV improved Plan Present management Will DC home once INR above 2  LOS: 3 days    [name] N 10/10/2011, 12:02 PM

## 2011-10-10 NOTE — Plan of Care (Signed)
Problem: Food- and Nutrition-Related Knowledge Deficit (NB-1.1) Goal: Nutrition education Formal process to instruct or train a patient/client in a skill or to impart knowledge to help patients/clients voluntarily manage or modify food choices and eating behavior to maintain or improve health.  Outcome: Completed/Met Date Met:  10/10/11 RD verbally consulted by RN for pt with gout who had many questions about what foods would be better/worse for gout. Pt states that he has had education about this in the past, but it has been a long time. RD provided pt with hand out from the [employer] for "Low Purine Nutrition Therapy." RD went over foods to eat most often, in moderate amounts and foods to avoid. Pt had questions about specific foods, RD showed pt how to use the chart provided to look up foods and determine if he should eat them or not. Pt thankful for information. No additional questions.   Body mass index is 28.02 kg/(m^2).  Pt is overweight per current BMI.  Current Diet: Heart. PO intake: 100%  Chart Reviewed: no additional nutrition interventions at this time. Please consult as needed.   Clarene Duke RD, LDN Pager [phone] After Hours pager [phone]

## 2011-10-10 NOTE — Progress Notes (Signed)
1610 was notified by CNA that patient said that he was having chest pain which he described as being "worse than before". Entered patient's room. Patient alert and oriented x 4 and was sitting in bed eating. Obtained vital signs and documented and also got an EKG. Patient denied that chest pain was radiating. MD called and notified. New orders were written and prn given.

## 2011-10-11 LAB — CBC
MCH: 30.4 pg (ref 26.0–34.0)
MCHC: 35 g/dL (ref 30.0–36.0)
Platelets: 205 10*3/uL (ref 150–400)
RDW: 12.9 % (ref 11.5–15.5)

## 2011-10-11 LAB — BASIC METABOLIC PANEL
CO2: 23 mEq/L (ref 19–32)
Calcium: 9.6 mg/dL (ref 8.4–10.5)
GFR calc Af Amer: 51 mL/min — ABNORMAL LOW (ref >90)
Sodium: 140 mEq/L (ref 135–145)

## 2011-10-11 LAB — MAGNESIUM: Magnesium: 1.6 mg/dL (ref 1.5–2.5)

## 2011-10-11 LAB — PROTIME-INR
INR: 1.83 — ABNORMAL HIGH (ref 0.00–1.49)
Prothrombin Time: 20.5 seconds — ABNORMAL HIGH (ref 11.6–15.2)

## 2011-10-11 NOTE — Progress Notes (Signed)
ANTICOAGULATION CONSULT NOTE - Follow Up Consult  Pharmacy Consult for Heparin Indication: chest pain/ACS and hx Afib with SUBtherapeutic INR  No Known Allergies  Patient Measurements: Height: 6\' 3"  (190.5 cm) Weight: 223 lb 8.7 oz (101.4 kg) (scale C) IBW/kg (Calculated) : 84.5  Heparin Dosing Weight: 101.2 kg Vital Signs: Temp: 99.5 F (37.5 C) (10/09 0450) Temp src: Oral (10/09 0450) BP: 134/90 mmHg (10/09 0450) Pulse Rate: 73  (10/09 0450)  Labs:  Basename 10/11/11 0600 10/10/11 0515 10/09/11 1136 10/09/11 0600  HGB 12.5* 12.7* -- --  HCT 35.7* 36.7* -- 37.4*  PLT 205 223 -- 208  APTT -- -- -- --  LABPROT 20.5* 16.3* -- 15.5*  INR 1.83* 1.34 -- 1.25  HEPARINUNFRC 0.51 0.58 0.55 --  CREATININE 1.64* 1.57* -- 1.50*  CKTOTAL -- 50 -- --  CKMB -- 2.6 -- --  TROPONINI -- <0.30 -- --    Estimated Creatinine Clearance: 61.1 ml/min (by C-G formula based on Cr of 1.64).   Assessment: 61 y.o. M who presented to [facility] with CP. Pharmacy has been consulted to start heparin while awaiting further cardiac evaluation. Of noting, the patient was on warfarin PTA for hx Afib and was admitted with a SUBtherapeutic INR of 1.23. The patient is currently taking coumadin at his home dose per MD. Heparin level (0.51) for today is at-goal on 1150 units/hr. INR today is 1.83.  Goal of Therapy:  Heparin level 0.3-0.7 units/ml Monitor platelets by anticoagulation protocol: Yes   Plan:  -No heparin changes needed -Daily CBC and heparin level  Harland German, Pharm D 10/11/2011 9:21 AM

## 2011-10-11 NOTE — Progress Notes (Signed)
Subjective:  Patient denies any chest pain or shortness of breath. Had nonsustained VT last night asymptomatic. INR gradually creeping up still remains subtherapeutic  Objective:  Vital Signs in the last 24 hours: Temp:  [98.2 F (36.8 C)-99.5 F (37.5 C)] 99.5 F (37.5 C) (10/09 0450) Pulse Rate:  [62-77] 77  (10/09 0951) Resp:  [16-18] 16  (10/09 0450) BP: (123-147)/(81-94) 147/94 mmHg (10/09 0951) SpO2:  [95 %-99 %] 95 % (10/09 0450) Weight:  [101.4 kg (223 lb 8.7 oz)] 101.4 kg (223 lb 8.7 oz) (10/09 0450)  Intake/Output from previous day: 10/08 0701 - 10/09 0700 In: 1107.5 [P.O.:840; I.V.:267.5] Out: 1175 [Urine:1175] Intake/Output from this shift: Total I/O In: 240 [P.O.:240] Out: 225 [Urine:225]  Physical Exam: Neck: no adenopathy, no carotid bruit, no JVD and supple, symmetrical, trachea midline Lungs: clear to auscultation bilaterally Heart: irregularly irregular rhythm, S1, S2 normal and Soft systolic murmur noted Abdomen: soft, non-tender; bowel sounds normal; no masses,  no organomegaly Extremities: extremities normal, atraumatic, no cyanosis or edema  Lab Results:  Basename 10/11/11 0600 10/10/11 0515  WBC 7.4 6.4  HGB 12.5* 12.7*  PLT 205 223    Basename 10/11/11 0600 10/10/11 0515  NA 140 140  K 3.9 3.7  CL 108 109  CO2 23 24  GLUCOSE 82 96  BUN 19 23  CREATININE 1.64* 1.57*    Basename 10/10/11 0515  TROPONINI <0.30   Hepatic Function Panel No results found for this basename: PROT,ALBUMIN,AST,ALT,ALKPHOS,BILITOT,BILIDIR,IBILI in the last 72 hours  Basename 10/09/11 0600  CHOL 144   No results found for this basename: PROTIME in the last 72 hours  Imaging: Imaging results have been reviewed and No results found.  Cardiac Studies:  Assessment/Plan:  Status post atypical chest pain  Decompensated systolic heart failure improved Nonischemic cardiomyopathy  Chronic atrial fibrillation  Cervical disc disease  Probable subacute occipital  infarct  History of sarcoidosis  Subtherapeutic INR  Gouty arthritis  Chronic kidney disease stage IV improved  Plan  Present management Will DC home once INR above 2.  LOS: 4 days    [name] N 10/11/2011, 11:39 AM

## 2011-10-11 NOTE — Clinical Documentation Improvement (Signed)
CHF DOCUMENTATION CLARIFICATION QUERY  THIS DOCUMENT IS NOT A PERMANENT PART OF THE MEDICAL RECORD   Please update your documentation within the medical record to reflect your response to this query.                                                                                     10/11/11  Dr. [name] and/or Associates,  In a better effort to capture your patient's severity of illness, reflect appropriate length of stay and utilization of resources, a review of the patient medical record has revealed the following indicators:  Recent admission for Orthostatic Syncope 09/30/11 - 10/04/11 Instructed to stop Lasix and Metolazone  "Decompensated systolic heart failure Lungs: Decreased breath sound with bibasilar Rales noted" HARWANI,MOHAN N  10/09/2011, 2:34 PM  Spironolactone 25mg  daily added 10/09/11 per [facility]  "Decompensated systolic heart failure improved"  HARWANI,MOHAN N  10/11/2011, 11:39 AM   Admission BNP 10/07/11 - 3313.0  Repeat BNP 10/10/11 - 9821.0   Based on your clinical judgment, please document in the progress notes and discharge summary if a condition below provides greater specificity regarding the patient's heart failure:   - Acute on Chronic Systolic Heart Failure   - Other Condition   - Unable to Clinically Determine   In responding to this query please exercise your independent judgment.    The fact that a query is asked, does not imply that any particular answer is desired or expected.   10/17/11 - query not needed.  [name] RN  Thank You,  [name]  RN BSN CCDS Certified Clinical Documentation Specialist: Cell   [phone]  Health Information Management [place]  TO RESPOND TO THE THIS QUERY, FOLLOW THE INSTRUCTIONS BELOW:  1. If needed, update documentation for the patient's encounter via the notes activity.  2. Access this query again and click edit on the In [employer].  3. After updating, or not, click F2 to  complete all highlighted (required) fields concerning your review. Select "additional documentation in the medical record" OR "no additional documentation provided".  4. Click Sign note button.  5. The deficiency will fall out of your In Basket *Please let us know if you are not able to complete this workflow by phone or e-mail (listed below).

## 2011-10-11 NOTE — Progress Notes (Signed)
0028 Patient heart rate decreased to 46 bpm non-sustained then increased back to 77 bpm. Patient was alert and oriented x 4, asymptomatic, and was resting in bed at the time.

## 2011-10-12 LAB — PROTIME-INR
INR: 2.24 — ABNORMAL HIGH (ref 0.00–1.49)
Prothrombin Time: 23.8 seconds — ABNORMAL HIGH (ref 11.6–15.2)

## 2011-10-12 LAB — HEPARIN LEVEL (UNFRACTIONATED): Heparin Unfractionated: 0.13 IU/mL — ABNORMAL LOW (ref 0.30–0.70)

## 2011-10-12 LAB — CBC
HCT: 36 % — ABNORMAL LOW (ref 39.0–52.0)
Platelets: 201 10*3/uL (ref 150–400)
RDW: 13.2 % (ref 11.5–15.5)
WBC: 8.3 10*3/uL (ref 4.0–10.5)

## 2011-10-12 MED ORDER — SPIRONOLACTONE 25 MG PO TABS: 25.0000 mg | ORAL_TABLET | Freq: Every day | ORAL | Status: DC

## 2011-10-12 NOTE — Discharge Summary (Signed)
  Discharge summary dictated on 10/12/2011 the patient number is [phone]

## 2011-10-12 NOTE — Progress Notes (Signed)
Verbalizes understanding of discharge instructions.  Reviewed discharge instructions with patient.  Thanks, [employer]

## 2011-10-12 NOTE — Progress Notes (Signed)
ANTICOAGULATION CONSULT NOTE - Follow Up Consult  Pharmacy Consult for Heparin Indication: chest pain/ACS and hx Afib with SUBtherapeutic INR  No Known Allergies  Patient Measurements: Height: 6\' 3"  (190.5 cm) Weight: 222 lb 6.4 oz (100.88 kg) (scale c) IBW/kg (Calculated) : 84.5  Heparin Dosing Weight: 101.2 kg Vital Signs: Temp: 98.5 F (36.9 C) (10/10 0549) Temp src: Oral (10/10 0549) BP: 137/97 mmHg (10/10 0549) Pulse Rate: 73  (10/10 0549)  Labs:  Basename 10/12/11 0525 10/11/11 0600 10/10/11 0515  HGB 12.6* 12.5* --  HCT 36.0* 35.7* 36.7*  PLT 201 205 223  APTT -- -- --  LABPROT 23.8* 20.5* 16.3*  INR 2.24* 1.83* 1.34  HEPARINUNFRC 0.13* 0.51 0.58  CREATININE -- 1.64* 1.57*  CKTOTAL -- -- 50  CKMB -- -- 2.6  TROPONINI -- -- <0.30    Estimated Creatinine Clearance: 56.5 ml/min (by C-G formula based on Cr of 1.64).   Assessment: 61 y.o. M who presented to the [facility] on 10/5 with CP. Pt has been on IV heparin for chest pain/ACS and hx Afib with SUBtherapeutic INR. The chest pain was atypical, and plan for D/C home once INR > 2 per MD note. INR = 2.24 today. Hgb and Plt are stable. Heparin level subtherapeutic this morning.   Goal of Therapy:  Heparin level 0.3-0.7 units/ml Monitor platelets by anticoagulation protocol: Yes   Plan:  - D/C heparin infusion. - coumadin 10mg  daily is an appropriate dose for him if discharge home. - will monitor PT/INR daily if pt. stays.  Bayard Hugger, PharmD, BCPS  Clinical Pharmacist  Pager: [phone]   10/12/2011 8:30 AM

## 2011-10-12 NOTE — Progress Notes (Signed)
Utilization Review Completed.  

## 2011-10-12 NOTE — Progress Notes (Signed)
Peripheral IV removed from LFA.  Catheter intact.  Site clean, dry, and intact.  Tele d/c'd.  Pt d/c'd to home.  Home meds and d/c instructions have been discussed with pt.  Pt denies any questions or concerns at this time.  Pt waiting on transportation and appears in no acute distress at this time. Nino Glow RN
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
## 2011-10-31 ENCOUNTER — Ambulatory Visit (INDEPENDENT_AMBULATORY_CARE_PROVIDER_SITE_OTHER): Payer: PRIVATE HEALTH INSURANCE | Admitting: Internal Medicine

## 2011-10-31 ENCOUNTER — Encounter: Payer: Self-pay | Admitting: Internal Medicine

## 2011-10-31 VITALS — BP 135/89 | HR 68 | Ht 75.0 in | Wt 229.0 lb

## 2011-10-31 DIAGNOSIS — I420 Dilated cardiomyopathy: Secondary | ICD-10-CM

## 2011-10-31 DIAGNOSIS — I5023 Acute on chronic systolic (congestive) heart failure: Secondary | ICD-10-CM

## 2011-10-31 DIAGNOSIS — I509 Heart failure, unspecified: Secondary | ICD-10-CM

## 2011-10-31 DIAGNOSIS — Z95810 Presence of automatic (implantable) cardiac defibrillator: Secondary | ICD-10-CM

## 2011-10-31 DIAGNOSIS — I428 Other cardiomyopathies: Secondary | ICD-10-CM

## 2011-10-31 LAB — ICD DEVICE OBSERVATION
BATTERY VOLTAGE: 2.617 V
BRDY-0002RV: 45 {beats}/min
FVT: 0
HV IMPEDENCE: 50 Ohm
RV LEAD AMPLITUDE: 12 mv
RV LEAD IMPEDENCE ICD: 462.5 Ohm
RV LEAD THRESHOLD: 1.25 V
TOT-0010: 7
TZAT-0020SLOWVT: 1 ms
TZON-0005SLOWVT: 6
TZON-0010SLOWVT: 80 ms
TZST-0001SLOWVT: 2
TZST-0001SLOWVT: 4
TZST-0001SLOWVT: 5
TZST-0003SLOWVT: 36 J
TZST-0003SLOWVT: 36 J
VF: 0

## 2011-10-31 MED ORDER — METOLAZONE 5 MG PO TABS: 2.5000 mg | ORAL_TABLET | Freq: Every day | ORAL | Status: DC

## 2011-10-31 NOTE — Assessment & Plan Note (Signed)
Today he appears to be a little dry. I've asked the patient to reduce his dose of Zaroxolyn to 2.5 mg daily. He is instructed to weigh himself. If his weight begins to go up, he is instructed to increase his dose of Zaroxolyn to 5 mg daily.

## 2011-10-31 NOTE — Patient Instructions (Signed)
Your physician wants you to follow-up in: 3 months with device clinic on 02/01/12 at 11:00am and in 12 months with Dr Court Joy will receive a reminder letter in the mail two months in advance. If you don't receive a letter, please call our office to schedule the follow-up appointment.   Your physician has recommended you make the following change in your medication:  1)Decrease Metolazone to 1/2 tablet daily.  If your weight goes up by 3 pounds take a whole tablet daily until weight back down

## 2011-10-31 NOTE — Assessment & Plan Note (Signed)
His device is working normally. We'll plan to recheck in several months. 

## 2011-10-31 NOTE — Progress Notes (Signed)
HPI [name] returns today for followup. He is a very pleasant middle-age man with a long-standing dilated cardiomyopathy, chronic systolic heart failure, ventricular tachycardia, status post ICD implantation. He also is a history of paroxysmal atrial arrhythmias which have been controlled on amiodarone. The patient was hospitalized several weeks ago and was found to be in worsening heart failure. His medications were adjusted and he was discharged home. Today he states that his energy level is down and that his dizziness is worsened. He states that after he takes his diuretic therapy, it feels like all the energy is pulled away from him. No Known Allergies   Current Outpatient Prescriptions  Medication Sig Dispense Refill  . allopurinol (ZYLOPRIM) 100 MG tablet Take 100 mg by mouth daily.      Marland Kitchen amiodarone (PACERONE) 200 MG tablet Take 1 tablet by mouth daily.      Marland Kitchen BIDIL 20-37.5 MG per tablet Take 1 tablet by mouth daily.      . carvedilol (COREG) 6.25 MG tablet Take 1 tablet by mouth Twice daily.      . colchicine 0.6 MG tablet Take 0.6 mg by mouth daily as needed. For gout      . furosemide (LASIX) 80 MG tablet Take 1 tablet by mouth Twice daily.      Marland Kitchen KLOR-CON M20 20 MEQ tablet Take 1 tablet by mouth daily.      Marland Kitchen losartan (COZAAR) 25 MG tablet Take 25 mg by mouth daily.      . methocarbamol (ROBAXIN) 500 MG tablet Take 500 mg by mouth 2 (two) times daily as needed. For muscle spasms      . metolazone (ZAROXOLYN) 5 MG tablet Take 0.5 tablets (2.5 mg total) by mouth daily.      . metoprolol succinate (TOPROL-XL) 25 MG 24 hr tablet Take 25 mg by mouth daily.      . pantoprazole (PROTONIX) 40 MG tablet Take 40 mg by mouth daily at 12 noon.      . rosuvastatin (CRESTOR) 5 MG tablet Take 5 mg by mouth at bedtime.      Marland Kitchen spironolactone (ALDACTONE) 25 MG tablet Take 1 tablet (25 mg total) by mouth daily.  30 tablet  3  . warfarin (COUMADIN) 10 MG tablet Take 10 mg by mouth every evening.           Past Medical History  Diagnosis Date  . CHF (congestive heart failure)   . Sarcoidosis   . Cardiomyopathy, dilated, nonischemic     non ischemic by cath  . Acute on chronic systolic heart failure   . Automatic implantable cardiac defibrillator in situ   . Atrial fibrillation   . NSVT (nonsustained ventricular tachycardia)   . GERD (gastroesophageal reflux disease)   . Hypercholesteremia   . Myocardial infarction   . Shortness of breath   . Chronic kidney disease (CKD), stage III (moderate)   . Pacemaker   . Anginal pain   . Gout   . Hypertension     [name]  . Coronary artery disease     ROS:   All systems reviewed and negative except as noted in the HPI.   Past Surgical History  Procedure Date  . Back surgery 1987    Ruptured disk repair  . Pacemaker insertion     with ICD  . Tee without cardioversion 01/17/2011    Procedure: TRANSESOPHAGEAL ECHOCARDIOGRAM (TEE);  Surgeon: Ricki Rodriguez, MD;  Location[facility] ENDOSCOPY;  Service: Cardiovascular;  Laterality:  N/A;  . Cardioversion 01/17/2011    Procedure: CARDIOVERSION;  Surgeon: Ricki Rodriguez, MD;  Location: [facility] ENDOSCOPY;  Service: Cardiovascular;  Laterality: N/A;  . Cardiac catheterization   . Insert / replace / remove pacemaker   . Anterior cervical decomp/discectomy fusion 08/21/2011    Procedure: ANTERIOR CERVICAL DECOMPRESSION/DISCECTOMY FUSION 2 LEVELS;  Surgeon: Eldred Manges, MD;  Location: [facility] OR;  Service: Orthopedics;  Laterality: N/A;  C5-6, C6-7 Anterior Cervical Discectomy and Fusion, allograft, plate     Family History  Problem Relation Age of Onset  . Heart disease    . Heart failure    . Stroke    . Anesthesia problems Neg Hx   . Hypotension Neg Hx   . Malignant hyperthermia Neg Hx   . Pseudochol deficiency Neg Hx      History   Social History  . Marital Status: Divorced    Spouse Name: N/A    Number of Children: N/A  . Years of Education: N/A   Occupational History  . Not on  file.   Social History Main Topics  . Smoking status: Never Smoker   . Smokeless tobacco: Never Used  . Alcohol Use: No  . Drug Use: No  . Sexually Active: Not Currently   Other Topics Concern  . Not on file   Social History Narrative  . No narrative on file     BP 135/89  Pulse 68  Ht 6\' 3"  (1.905 m)  Wt 229 lb (103.874 kg)  BMI 28.62 kg/m2  SpO2 98%  Physical Exam:  Well appearing middle-aged man, NAD HEENT: Unremarkable Neck:  No JVD, no thyromegally Lungs:  Clear with no wheezes, rales, or rhonchi. HEART:  Regular rate rhythm, no murmurs, no rubs, no clicks Abd:  soft, positive bowel sounds, no organomegally, no rebound, no guarding Ext:  2 plus pulses, no edema, no cyanosis, no clubbing Skin:  No rashes no nodules Neuro:  CN II through XII intact, motor grossly intact  Labs -reviewed from a week ago. Kidney function is stable  DEVICE  Normal device function.  See PaceArt for details.   Assess/Plan:

## 2011-11-06 ENCOUNTER — Encounter (HOSPITAL_COMMUNITY): Payer: Self-pay | Admitting: *Deleted

## 2011-11-06 ENCOUNTER — Emergency Department (HOSPITAL_COMMUNITY)
Admission: EM | Admit: 2011-11-06 | Discharge: 2011-11-07 | Disposition: A | Discharge status: Other Acute Inpatient Hospital | Payer: PRIVATE HEALTH INSURANCE | Attending: Emergency Medicine | Admitting: Emergency Medicine

## 2011-11-06 DIAGNOSIS — N183 Chronic kidney disease, stage 3 unspecified: Secondary | ICD-10-CM | POA: Insufficient documentation

## 2011-11-06 DIAGNOSIS — I5023 Acute on chronic systolic (congestive) heart failure: Secondary | ICD-10-CM | POA: Insufficient documentation

## 2011-11-06 DIAGNOSIS — I129 Hypertensive chronic kidney disease with stage 1 through stage 4 chronic kidney disease, or unspecified chronic kidney disease: Secondary | ICD-10-CM | POA: Insufficient documentation

## 2011-11-06 DIAGNOSIS — I498 Other specified cardiac arrhythmias: Secondary | ICD-10-CM | POA: Insufficient documentation

## 2011-11-06 DIAGNOSIS — Z79899 Other long term (current) drug therapy: Secondary | ICD-10-CM | POA: Insufficient documentation

## 2011-11-06 DIAGNOSIS — M109 Gout, unspecified: Secondary | ICD-10-CM | POA: Insufficient documentation

## 2011-11-06 DIAGNOSIS — I2510 Atherosclerotic heart disease of native coronary artery without angina pectoris: Secondary | ICD-10-CM | POA: Insufficient documentation

## 2011-11-06 DIAGNOSIS — Z950 Presence of cardiac pacemaker: Secondary | ICD-10-CM | POA: Insufficient documentation

## 2011-11-06 DIAGNOSIS — E7800 Pure hypercholesterolemia, unspecified: Secondary | ICD-10-CM | POA: Insufficient documentation

## 2011-11-06 DIAGNOSIS — I252 Old myocardial infarction: Secondary | ICD-10-CM | POA: Insufficient documentation

## 2011-11-06 DIAGNOSIS — I209 Angina pectoris, unspecified: Secondary | ICD-10-CM | POA: Insufficient documentation

## 2011-11-06 DIAGNOSIS — I4891 Unspecified atrial fibrillation: Secondary | ICD-10-CM | POA: Insufficient documentation

## 2011-11-06 DIAGNOSIS — R0602 Shortness of breath: Secondary | ICD-10-CM | POA: Insufficient documentation

## 2011-11-06 DIAGNOSIS — I428 Other cardiomyopathies: Secondary | ICD-10-CM | POA: Insufficient documentation

## 2011-11-06 DIAGNOSIS — R45851 Suicidal ideations: Secondary | ICD-10-CM | POA: Insufficient documentation

## 2011-11-06 DIAGNOSIS — K219 Gastro-esophageal reflux disease without esophagitis: Secondary | ICD-10-CM | POA: Insufficient documentation

## 2011-11-06 DIAGNOSIS — Z7901 Long term (current) use of anticoagulants: Secondary | ICD-10-CM | POA: Insufficient documentation

## 2011-11-06 LAB — COMPREHENSIVE METABOLIC PANEL
ALT: 24 U/L (ref 0–53)
AST: 29 U/L (ref 0–37)
Alkaline Phosphatase: 66 U/L (ref 39–117)
CO2: 21 mEq/L (ref 19–32)
Chloride: 107 mEq/L (ref 96–112)
GFR calc Af Amer: 58 mL/min — ABNORMAL LOW (ref >90)
GFR calc non Af Amer: 50 mL/min — ABNORMAL LOW (ref >90)
Glucose, Bld: 92 mg/dL (ref 70–99)
Potassium: 4 mEq/L (ref 3.5–5.1)
Sodium: 138 mEq/L (ref 135–145)
Total Bilirubin: 0.4 mg/dL (ref 0.3–1.2)

## 2011-11-06 LAB — RAPID URINE DRUG SCREEN, HOSP PERFORMED
Amphetamines: NOT DETECTED
Barbiturates: NOT DETECTED
Opiates: NOT DETECTED
Tetrahydrocannabinol: NOT DETECTED

## 2011-11-06 LAB — CBC
Hemoglobin: 13.4 g/dL (ref 13.0–17.0)
MCH: 30.5 pg (ref 26.0–34.0)
Platelets: 214 10*3/uL (ref 150–400)
RBC: 4.39 MIL/uL (ref 4.22–5.81)
WBC: 6.6 10*3/uL (ref 4.0–10.5)

## 2011-11-06 MED ORDER — FUROSEMIDE 80 MG PO TABS
80.0000 mg | ORAL_TABLET | Freq: Two times a day (BID) | ORAL | Status: DC
  Administered 2011-11-07 (×2): 80 mg via ORAL
  Filled 2011-11-06 (×4): qty 1

## 2011-11-06 MED ORDER — ALLOPURINOL 100 MG PO TABS
100.0000 mg | ORAL_TABLET | Freq: Every day | ORAL | Status: DC
Start: 2011-11-07 — End: 2011-11-07
  Administered 2011-11-07: 100 mg via ORAL
  Filled 2011-11-06: qty 1

## 2011-11-06 MED ORDER — ACETAMINOPHEN 325 MG PO TABS: 650.0000 mg | ORAL_TABLET | ORAL | Status: DC | PRN

## 2011-11-06 MED ORDER — CARVEDILOL 3.125 MG PO TABS: 3.1250 mg | ORAL_TABLET | Freq: Two times a day (BID) | ORAL | Status: DC

## 2011-11-06 MED ORDER — ONDANSETRON HCL 4 MG PO TABS: 4.0000 mg | ORAL_TABLET | Freq: Three times a day (TID) | ORAL | Status: DC | PRN

## 2011-11-06 MED ORDER — SPIRONOLACTONE 25 MG PO TABS
25.0000 mg | ORAL_TABLET | Freq: Every day | ORAL | Status: DC
  Administered 2011-11-07: 25 mg via ORAL
  Filled 2011-11-06: qty 1

## 2011-11-06 MED ORDER — ISOSORB DINITRATE-HYDRALAZINE 20-37.5 MG PO TABS
1.0000 | ORAL_TABLET | Freq: Every day | ORAL | Status: DC
  Administered 2011-11-07: 1 via ORAL
  Filled 2011-11-06: qty 1

## 2011-11-06 MED ORDER — DIGOXIN 250 MCG PO TABS
0.2500 mg | ORAL_TABLET | Freq: Every day | ORAL | Status: DC
  Administered 2011-11-07: 0.25 mg via ORAL
  Filled 2011-11-06: qty 1

## 2011-11-06 MED ORDER — LORAZEPAM 1 MG PO TABS: 1.0000 mg | ORAL_TABLET | Freq: Three times a day (TID) | ORAL | Status: DC | PRN

## 2011-11-06 MED ORDER — PANTOPRAZOLE SODIUM 40 MG PO TBEC
40.0000 mg | DELAYED_RELEASE_TABLET | Freq: Every day | ORAL | Status: DC
  Administered 2011-11-07: 40 mg via ORAL
  Filled 2011-11-06: qty 1

## 2011-11-06 MED ORDER — AMIODARONE HCL 200 MG PO TABS
200.0000 mg | ORAL_TABLET | Freq: Every day | ORAL | Status: DC
  Administered 2011-11-07: 200 mg via ORAL
  Filled 2011-11-06: qty 1

## 2011-11-06 MED ORDER — ZOLPIDEM TARTRATE 5 MG PO TABS: 5.0000 mg | ORAL_TABLET | Freq: Every evening | ORAL | Status: DC | PRN

## 2011-11-06 MED ORDER — ATORVASTATIN CALCIUM 10 MG PO TABS
10.0000 mg | ORAL_TABLET | Freq: Every day | ORAL | Status: DC
  Administered 2011-11-07: 10 mg via ORAL
  Filled 2011-11-06: qty 1

## 2011-11-06 MED ORDER — IBUPROFEN 200 MG PO TABS: 600.0000 mg | ORAL_TABLET | Freq: Three times a day (TID) | ORAL | Status: DC | PRN

## 2011-11-06 MED ORDER — METOLAZONE 2.5 MG PO TABS
2.5000 mg | ORAL_TABLET | Freq: Every day | ORAL | Status: DC
  Administered 2011-11-07: 2.5 mg via ORAL
  Filled 2011-11-06: qty 1

## 2011-11-06 MED ORDER — WARFARIN SODIUM 10 MG PO TABS
10.0000 mg | ORAL_TABLET | Freq: Every evening | ORAL | Status: DC
  Administered 2011-11-07 (×2): 10 mg via ORAL
  Filled 2011-11-06 (×2): qty 1

## 2011-11-06 MED ORDER — METOPROLOL SUCCINATE ER 25 MG PO TB24
25.0000 mg | ORAL_TABLET | Freq: Every day | ORAL | Status: DC
  Administered 2011-11-07: 25 mg via ORAL
  Filled 2011-11-06: qty 1

## 2011-11-06 NOTE — ED Notes (Signed)
Pt wanded by security, 2 pt belonging bags and 1 trash bag at triage desk

## 2011-11-06 NOTE — ED Notes (Signed)
Pt states he has been dealing with depression for a long time and that everything has built up now and he feels like he is going to explode. Pt states that when he was having thoughts of walking in front of a car he knew it was time to go and seek help. Pt states he has no family support, but has the support of his church. Pt states that he holds a lot of resentment with his family after his father died 3 years ago. Pt also states he was disappointed in his self due to him getting high off of crack again after being clean since 2007. Pt states he got high in hopes that his pacemaker/defibrillator would not kick in.

## 2011-11-06 NOTE — ED Notes (Signed)
Pt belongings placed in locker #28. Pt also asked that his mother, Odin Mariani [phone]) be notified that he was in the hospital.

## 2011-11-06 NOTE — ED Provider Notes (Signed)
History     CSN: [id number]  Arrival date & time 11/06/11  2118   First MD Initiated Contact with Patient 11/06/11 2305      Chief Complaint  Patient presents with  . Medical Clearance     The history is provided by the patient.   patient reports recent relapse of cocaine with cocaine abuse this weekend.  He reports increasing depression over the past several months.  He had vague thoughts of walking in front of her car this evening and thus he brought himself to the emergency room for evaluation.  He denies intentional overdose.  He has not tried to kill himself today.  He has no thoughts of killing anyone else.  He denies hallucinations.  No headache.  No chest pain.  No shortness of breath or abdominal pain.  No other complaints.  His symptoms are mild to moderate in severity.  He denies additional drug use.  He is on Coumadin.  Past Medical History  Diagnosis Date  . CHF (congestive heart failure)   . Sarcoidosis   . Cardiomyopathy, dilated, nonischemic     non ischemic by cath  . Acute on chronic systolic heart failure   . Automatic implantable cardiac defibrillator in situ   . Atrial fibrillation   . NSVT (nonsustained ventricular tachycardia)   . GERD (gastroesophageal reflux disease)   . Hypercholesteremia   . Myocardial infarction   . Shortness of breath   . Chronic kidney disease (CKD), stage III (moderate)   . Pacemaker   . Anginal pain   . Gout   . Hypertension     [name]  . Coronary artery disease     Past Surgical History  Procedure Date  . Back surgery 1987    Ruptured disk repair  . Pacemaker insertion     with ICD  . Tee without cardioversion 01/17/2011    Procedure: TRANSESOPHAGEAL ECHOCARDIOGRAM (TEE);  Surgeon: Ricki Rodriguez, MD;  Location: [facility] ENDOSCOPY;  Service: Cardiovascular;  Laterality: N/A;  . Cardioversion 01/17/2011    Procedure: CARDIOVERSION;  Surgeon: Ricki Rodriguez, MD;  Location: [facility] ENDOSCOPY;  Service: Cardiovascular;   Laterality: N/A;  . Cardiac catheterization   . Insert / replace / remove pacemaker   . Anterior cervical decomp/discectomy fusion 08/21/2011    Procedure: ANTERIOR CERVICAL DECOMPRESSION/DISCECTOMY FUSION 2 LEVELS;  Surgeon: Eldred Manges, MD;  Location: [facility] OR;  Service: Orthopedics;  Laterality: N/A;  C5-6, C6-7 Anterior Cervical Discectomy and Fusion, allograft, plate    Family History  Problem Relation Age of Onset  . Heart disease    . Heart failure    . Stroke    . Anesthesia problems Neg Hx   . Hypotension Neg Hx   . Malignant hyperthermia Neg Hx   . Pseudochol deficiency Neg Hx     History  Substance Use Topics  . Smoking status: Never Smoker   . Smokeless tobacco: Never Used  . Alcohol Use: No      Review of Systems  All other systems reviewed and are negative.    Allergies  Review of patient's allergies indicates no known allergies.  Home Medications   Current Outpatient Rx  Name  Route  Sig  Dispense  Refill  . ALLOPURINOL 100 MG PO TABS   Oral   Take 100 mg by mouth daily.         . AMIODARONE HCL 200 MG PO TABS   Oral   Take 1 tablet by mouth daily.         Marland Kitchen  BIDIL 20-37.5 MG PO TABS   Oral   Take 1 tablet by mouth daily.         Marland Kitchen CARVEDILOL 6.25 MG PO TABS   Oral   Take 3.125 mg by mouth 2 (two) times daily with a meal.          . COLCHICINE 0.6 MG PO TABS   Oral   Take 0.6 mg by mouth daily as needed. For gout         . DIGOXIN 0.25 MG PO TABS   Oral   Take 0.25 mg by mouth daily.         . FUROSEMIDE 80 MG PO TABS   Oral   Take 1 tablet by mouth Twice daily.         Marland Kitchen KLOR-CON M20 20 MEQ PO TBCR   Oral   Take 1 tablet by mouth daily.         Marland Kitchen METOLAZONE 5 MG PO TABS   Oral   Take 0.5 tablets (2.5 mg total) by mouth daily.         Marland Kitchen METOPROLOL SUCCINATE ER 25 MG PO TB24   Oral   Take 25 mg by mouth daily.         . OXYCODONE-ACETAMINOPHEN 5-325 MG PO TABS   Oral   Take 1 tablet by mouth every 6 (six)  hours as needed. Pain         . PANTOPRAZOLE SODIUM 40 MG PO TBEC   Oral   Take 40 mg by mouth daily at 12 noon.         Marland Kitchen ROSUVASTATIN CALCIUM 5 MG PO TABS   Oral   Take 5 mg by mouth at bedtime.         . SPIRONOLACTONE 25 MG PO TABS   Oral   Take 1 tablet (25 mg total) by mouth daily.   30 tablet   3   . WARFARIN SODIUM 10 MG PO TABS   Oral   Take 10 mg by mouth every evening.           BP 123/81  Pulse 77  Temp 98.3 F (36.8 C) (Oral)  Resp 20  SpO2 100%  Physical Exam  Nursing note and vitals reviewed. Constitutional: He is oriented to person, place, and time. He appears well-developed and well-nourished.  HENT:  Head: Normocephalic and atraumatic.  Eyes: EOM are normal.  Neck: Normal range of motion.  Cardiovascular: Normal rate, regular rhythm, normal heart sounds and intact distal pulses.   Pulmonary/Chest: Effort normal and breath sounds normal. No respiratory distress.  Abdominal: Soft. He exhibits no distension. There is no tenderness.  Musculoskeletal: Normal range of motion.  Neurological: He is alert and oriented to person, place, and time.  Skin: Skin is warm and dry.  Psychiatric: His speech is normal and behavior is normal. Judgment normal. Cognition and memory are normal. He exhibits a depressed mood. He expresses suicidal ideation. He expresses suicidal plans. He expresses no homicidal plans.    ED Course  Procedures (including critical care time)  Labs Reviewed  COMPREHENSIVE METABOLIC PANEL - Abnormal; Notable for the following:    BUN 25 (*)     Creatinine, Ser 1.47 (*)     Albumin 3.0 (*)     GFR calc non Af Amer 50 (*)     GFR calc Af Amer 58 (*)     All other components within normal limits  SALICYLATE LEVEL - Abnormal; Notable  for the following:    Salicylate Lvl <2.0 (*)     All other components within normal limits  URINE RAPID DRUG SCREEN (HOSP PERFORMED) - Abnormal; Notable for the following:    Cocaine POSITIVE (*)       All other components within normal limits  PROTIME-INR - Abnormal; Notable for the following:    Prothrombin Time 18.0 (*)     INR 1.54 (*)     All other components within normal limits  DIGOXIN LEVEL - Abnormal; Notable for the following:    Digoxin Level 0.3 (*)     All other components within normal limits  ACETAMINOPHEN LEVEL  CBC  ETHANOL   No results found.  1. depression 2.  Suicidal ideation     MDM  I will ask the psychiatrist to evaluate the patient for mental health needs and disposition planning.  Medically clear at this time  1:28 AM Seen by psychiatry who is recommending admission to a psychiatric unit.  He is recommending Geodon 20 mg IM every 12 hours when necessary agitation.  Recommending Ativan 2 mg by mouth every 6 hours when necessary anxiety        Lyanne Co, MD 11/07/11 [phone]

## 2011-11-06 NOTE — ED Notes (Signed)
Pt states he's recently been depressed, anxious, states this is something new, states "I just haven't been myself", pt states he's been clean from crack since 2007 but this past weekend did crack again, states "that's not me to do that again". Pt states he does SI, denies HI, states he does have an anger problem lately and has been fighting with people but does not have HI. Pt states "I just want help, I need help".

## 2011-11-07 ENCOUNTER — Encounter (HOSPITAL_COMMUNITY): Payer: Self-pay | Admitting: Licensed Clinical Social Worker

## 2011-11-07 ENCOUNTER — Inpatient Hospital Stay (HOSPITAL_COMMUNITY)
Admission: AD | Admit: 2011-11-07 | Discharge: 2011-11-13 | DRG: 897 | Disposition: A | Discharge status: Other Acute Inpatient Hospital | Payer: 59 | Source: Ambulatory Visit | Attending: Psychiatry | Admitting: Psychiatry

## 2011-11-07 DIAGNOSIS — I509 Heart failure, unspecified: Secondary | ICD-10-CM | POA: Diagnosis present

## 2011-11-07 DIAGNOSIS — Z7901 Long term (current) use of anticoagulants: Secondary | ICD-10-CM

## 2011-11-07 DIAGNOSIS — R079 Chest pain, unspecified: Secondary | ICD-10-CM | POA: Diagnosis not present

## 2011-11-07 DIAGNOSIS — Z79899 Other long term (current) drug therapy: Secondary | ICD-10-CM

## 2011-11-07 DIAGNOSIS — I252 Old myocardial infarction: Secondary | ICD-10-CM

## 2011-11-07 DIAGNOSIS — E7800 Pure hypercholesterolemia, unspecified: Secondary | ICD-10-CM

## 2011-11-07 DIAGNOSIS — D869 Sarcoidosis, unspecified: Secondary | ICD-10-CM | POA: Diagnosis present

## 2011-11-07 DIAGNOSIS — E739 Lactose intolerance, unspecified: Secondary | ICD-10-CM

## 2011-11-07 DIAGNOSIS — R0789 Other chest pain: Secondary | ICD-10-CM

## 2011-11-07 DIAGNOSIS — I2510 Atherosclerotic heart disease of native coronary artery without angina pectoris: Secondary | ICD-10-CM | POA: Diagnosis present

## 2011-11-07 DIAGNOSIS — K219 Gastro-esophageal reflux disease without esophagitis: Secondary | ICD-10-CM

## 2011-11-07 DIAGNOSIS — F1994 Other psychoactive substance use, unspecified with psychoactive substance-induced mood disorder: Principal | ICD-10-CM

## 2011-11-07 DIAGNOSIS — I5022 Chronic systolic (congestive) heart failure: Secondary | ICD-10-CM

## 2011-11-07 DIAGNOSIS — I951 Orthostatic hypotension: Secondary | ICD-10-CM

## 2011-11-07 DIAGNOSIS — I5021 Acute systolic (congestive) heart failure: Secondary | ICD-10-CM

## 2011-11-07 DIAGNOSIS — I959 Hypotension, unspecified: Secondary | ICD-10-CM

## 2011-11-07 DIAGNOSIS — I10 Essential (primary) hypertension: Secondary | ICD-10-CM

## 2011-11-07 DIAGNOSIS — R197 Diarrhea, unspecified: Secondary | ICD-10-CM

## 2011-11-07 DIAGNOSIS — I472 Ventricular tachycardia, unspecified: Secondary | ICD-10-CM

## 2011-11-07 DIAGNOSIS — I420 Dilated cardiomyopathy: Secondary | ICD-10-CM

## 2011-11-07 DIAGNOSIS — R531 Weakness: Secondary | ICD-10-CM

## 2011-11-07 DIAGNOSIS — Z95810 Presence of automatic (implantable) cardiac defibrillator: Secondary | ICD-10-CM

## 2011-11-07 DIAGNOSIS — I428 Other cardiomyopathies: Secondary | ICD-10-CM

## 2011-11-07 DIAGNOSIS — R001 Bradycardia, unspecified: Secondary | ICD-10-CM

## 2011-11-07 DIAGNOSIS — M109 Gout, unspecified: Secondary | ICD-10-CM | POA: Diagnosis present

## 2011-11-07 DIAGNOSIS — F1420 Cocaine dependence, uncomplicated: Secondary | ICD-10-CM

## 2011-11-07 DIAGNOSIS — I4891 Unspecified atrial fibrillation: Secondary | ICD-10-CM

## 2011-11-07 DIAGNOSIS — I5023 Acute on chronic systolic (congestive) heart failure: Secondary | ICD-10-CM

## 2011-11-07 DIAGNOSIS — N182 Chronic kidney disease, stage 2 (mild): Secondary | ICD-10-CM

## 2011-11-07 LAB — DIGOXIN LEVEL: Digoxin Level: 0.3 ng/mL — ABNORMAL LOW (ref 0.8–2.0)

## 2011-11-07 LAB — PROTIME-INR
INR: 1.54 — ABNORMAL HIGH (ref 0.00–1.49)
Prothrombin Time: 18 seconds — ABNORMAL HIGH (ref 11.6–15.2)

## 2011-11-07 MED ORDER — ZIPRASIDONE MESYLATE 20 MG IM SOLR: 20.0000 mg | Freq: Two times a day (BID) | INTRAMUSCULAR | Status: DC | PRN

## 2011-11-07 MED ORDER — WARFARIN - PHYSICIAN DOSING INPATIENT: Freq: Every day | Status: DC

## 2011-11-07 MED ORDER — LORAZEPAM 1 MG PO TABS: 2.0000 mg | ORAL_TABLET | Freq: Four times a day (QID) | ORAL | Status: DC | PRN

## 2011-11-07 NOTE — ED Notes (Signed)
Patient resting in bed; no inappropriate behaviors noted at this time. Pt denies SI.

## 2011-11-07 NOTE — BH Assessment (Addendum)
Assessment Note   [name] is an 61 y.o. male. Patient presents to the ED for increased depression and passive suicidal thoughts. Per EDP notes,  "He had vague thoughts of walking in front of her car this evening and thus he brought himself to the emergency room for a evaluation". EDP notes also indicate that pt "denies intentional overdose; He has not tried to kill himself today; He has no thoughts of killing anyone else". Patient completed a [facility] assessment and also denied Suicidal ideations for today. He reported no plan and was able to contract for safety today. Pt does admit to increased depression over the past 2-3 weeks sts his symptoms are triggered by legal issues and conflict with family. Sts that his sister is the POA over his mother and this has torn is family apart. He lives with both sister and mother. Pt feels that his sister wants him out of the home and went to [employer] reporting that he stole her possessions. Patient sts that his sister lied to the police and now he is facing 20+ yrs in prison if found guilty of theft.   Patient denied HI and AVH's. Patient reported recent relapse of cocaine with cocaine abuse this weekend stating, "It was Homecoming and I couldn't help myself from the drug use". Patient upset that he relapsed this weekend stating that he had remained sober since May 2013.   Following the assessment patient asked if he could go to [facility] for help. Patient sts, "I need help for my substance abuse problem". Explained to patient that although he currently denies SI and is able to contract for safety the telepsych consult recommended a inpt psych admission. Patient completed a telepsych consult and a inpatient hospitalization was recommended. However, the telepsych does not specify or note if patient is SI, HI, and/or psychotic; depression is only noted. Patient sts that he never told the psychiatrist he was SI or HI. Patient continued to inquire more about the [facility] program  stating, "Ok I'll go because I need somewhere to stay anyway and can't go back in that house for my sister".    Axis I: Major Depression, single episode and Cocaine Abuse Axis II: Deferred Axis III:  Past Medical History  Diagnosis Date  . CHF (congestive heart failure)   . Sarcoidosis   . Cardiomyopathy, dilated, nonischemic     non ischemic by cath  . Acute on chronic systolic heart failure   . Automatic implantable cardiac defibrillator in situ   . Atrial fibrillation   . NSVT (nonsustained ventricular tachycardia)   . GERD (gastroesophageal reflux disease)   . Hypercholesteremia   . Myocardial infarction   . Shortness of breath   . Chronic kidney disease (CKD), stage III (moderate)   . Pacemaker   . Anginal pain   . Gout   . Hypertension     [name]  . Coronary artery disease    Axis IV: housing problems, other psychosocial or environmental problems, problems related to legal system/crime, problems related to social environment, problems with access to health care services and problems with primary support group Axis V: 31-40 impairment in reality testing  Past Medical History:  Past Medical History  Diagnosis Date  . CHF (congestive heart failure)   . Sarcoidosis   . Cardiomyopathy, dilated, nonischemic     non ischemic by cath  . Acute on chronic systolic heart failure   . Automatic implantable cardiac defibrillator in situ   . Atrial fibrillation   .  NSVT (nonsustained ventricular tachycardia)   . GERD (gastroesophageal reflux disease)   . Hypercholesteremia   . Myocardial infarction   . Shortness of breath   . Chronic kidney disease (CKD), stage III (moderate)   . Pacemaker   . Anginal pain   . Gout   . Hypertension     [name]  . Coronary artery disease     Past Surgical History  Procedure Date  . Back surgery 1987    Ruptured disk repair  . Pacemaker insertion     with ICD  . Tee without cardioversion 01/17/2011    Procedure:  TRANSESOPHAGEAL ECHOCARDIOGRAM (TEE);  Surgeon: Ricki Rodriguez, MD;  Location: [facility] ENDOSCOPY;  Service: Cardiovascular;  Laterality: N/A;  . Cardioversion 01/17/2011    Procedure: CARDIOVERSION;  Surgeon: Ricki Rodriguez, MD;  Location: [facility] ENDOSCOPY;  Service: Cardiovascular;  Laterality: N/A;  . Cardiac catheterization   . Insert / replace / remove pacemaker   . Anterior cervical decomp/discectomy fusion 08/21/2011    Procedure: ANTERIOR CERVICAL DECOMPRESSION/DISCECTOMY FUSION 2 LEVELS;  Surgeon: Eldred Manges, MD;  Location: [facility] OR;  Service: Orthopedics;  Laterality: N/A;  C5-6, C6-7 Anterior Cervical Discectomy and Fusion, allograft, plate    Family History:  Family History  Problem Relation Age of Onset  . Heart disease    . Heart failure    . Stroke    . Anesthesia problems Neg Hx   . Hypotension Neg Hx   . Malignant hyperthermia Neg Hx   . Pseudochol deficiency Neg Hx     Social History:  reports that he has never smoked. He has never used smokeless tobacco. He reports that he uses illicit drugs ("Crack" cocaine). He reports that he does not drink alcohol.  Additional Social History:  Alcohol / Drug Use Pain Medications: SEE MAR Prescriptions: SEE MAR Over the Counter: SEE MAR History of alcohol / drug use?: Yes Substance #1 Name of Substance 1: Crack Cocaine 1 - Age of First Use: "I started in my early 6's" 1 - Amount (size/oz): varies 1 - Frequency: used this past weekend; however no use prior to this weekend since May 30, 2011 1 - Duration: weekend use only 1 - Last Use / Amount: 11/07/2011  CIWA: CIWA-Ar BP: 115/77 mmHg Pulse Rate: 72  COWS:    Allergies: No Known Allergies  Home Medications:  (Not in a hospital admission)  OB/GYN Status:  No LMP for male patient.  General Assessment Data Location of Assessment: [facility] Living Arrangements: Other (Comment) (lives with mother and sister) Can pt return to current living arrangement?: Yes Admission Status:  Voluntary Is patient capable of signing voluntary admission?: Yes Transfer from: Acute Hospital Referral Source: Self/Family/Friend     Risk to self Suicidal Ideation: Yes-Currently Present Suicidal Intent: No Is patient at risk for suicide?: No Suicidal Plan?: No (denies current plan; thoughts of walking in traffic 1 wk ago) Access to Means: No What has been your use of drugs/alcohol within the last 12 months?:  (pt reportedly relapsed on crack cocaine this past weekend) Previous Attempts/Gestures: No How many times?:  (0) Other Self Harm Risks:  (none reported) Triggers for Past Attempts:  (n/a) Intentional Self Injurious Behavior: None Family Suicide History: No Recent stressful life event(s): Other (Comment);Turmoil (Comment);Conflict (Comment);Legal Issues (conflct w/ family mainly sister, poor health, living arrange) Persecutory voices/beliefs?: No Depression: No Depression Symptoms: Feeling worthless/self pity;Loss of interest in usual pleasures;Fatigue Substance abuse history and/or treatment for substance abuse?: No Suicide prevention  information given to non-admitted patients: Not applicable  Risk to Others Homicidal Ideation: No Thoughts of Harm to Others: No Current Homicidal Intent: No Current Homicidal Plan: No Access to Homicidal Means: No Identified Victim:  (n/a) History of harm to others?: No Assessment of Violence: None Noted Violent Behavior Description:  (Patient is calm and cooperative) Does patient have access to weapons?: No Criminal Charges Pending?: Yes Describe Pending Criminal Charges:  (Charged w/ theft for stealing sisters property) Does patient have a court date: Yes (Sts, "I don't know .Marland KitchenMy lawyer has to get back with me")  Psychosis Hallucinations: None noted Delusions: None noted  Mental Status Report Appear/Hygiene: Disheveled Eye Contact: Good Motor Activity: Freedom of movement Speech: Logical/coherent Level of Consciousness:  Alert Mood: Depressed Affect: Appropriate to circumstance Anxiety Level: Minimal Thought Processes: Coherent Judgement: Impaired Orientation: Person;Time;Place;Situation Obsessive Compulsive Thoughts/Behaviors: None  Cognitive Functioning Concentration: Normal Memory: Recent Intact;Remote Intact IQ: Average Insight: Poor Impulse Control: Fair Appetite: Good Weight Loss:  (none reported) Weight Gain:  (none reported) Sleep: Decreased (Pt sts that for the past 3 wks his sleep has been poor) Total Hours of Sleep:  (3-4 hrs per night for the past 3 wks) Vegetative Symptoms: None  ADLScreening [facility] Assessment Services) Patient's cognitive ability adequate to safely complete daily activities?: Yes Patient able to express need for assistance with ADLs?: Yes Independently performs ADLs?: Yes (appropriate for developmental age)  Abuse/Neglect [facility]) Physical Abuse: Denies Verbal Abuse: Denies Sexual Abuse: Denies  Prior Inpatient Therapy Prior Inpatient Therapy: No Prior Therapy Dates:  (n/a) Prior Therapy Facilty/Provider(s):  (n/a) Reason for Treatment:  (n/a)  Prior Outpatient Therapy Prior Outpatient Therapy: No Prior Therapy Dates:  (n/a) Prior Therapy Facilty/Provider(s):  (n/a) Reason for Treatment:  (n/a)  ADL Screening (condition at time of admission) Patient's cognitive ability adequate to safely complete daily activities?: Yes Patient able to express need for assistance with ADLs?: Yes Independently performs ADLs?: Yes (appropriate for developmental age) Weakness of Legs: None Weakness of Arms/Hands: None  Home Assistive Devices/Equipment Home Assistive Devices/Equipment: None    Abuse/Neglect Assessment (Assessment to be complete while patient is alone) Physical Abuse: Denies Verbal Abuse: Denies Sexual Abuse: Denies Exploitation of patient/patient's resources: Denies Self-Neglect: Denies Values / Beliefs Cultural Requests During Hospitalization:  None Spiritual Requests During Hospitalization: None   Advance Directives (For Healthcare) Advance Directive: Patient does not have advance directive Nutrition Screen- [facility] Patient's home diet: Regular  Additional Information 1:1 In Past 12 Months?: No CIRT Risk: No Elopement Risk: No Does patient have medical clearance?: Yes     Disposition:   Pt referred to [facility] for inpatient treatment, per recommendations of the telepsych consult.   On Site Evaluation by:   Reviewed with Physician:     Melynda Ripple [facility] 11/07/2011 6:02 PM

## 2011-11-07 NOTE — ED Provider Notes (Addendum)
Filed Vitals:   11/07/11 0538  BP: 124/88  Pulse: 75  Temp: 98.6 F (37 C)  Resp: 20   Pt seen this am. No new complaints. Continued vague SI. INR subtherapeutic. Pt is ordered home dose and has daily INR ordered.  Raeford Razor, MD 11/07/11 [id number]  Raeford Razor, MD 11/07/11 [phone]

## 2011-11-07 NOTE — Progress Notes (Signed)
PHARMACIST - PHYSICIAN COMMUNICATION DR:  Patria Mane CONCERNING: Pharmacy Care Issues Regarding Warfarin Labs  RECOMMENDATION (Action Taken): A baseline and daily protime for three days has been ordered to meet the [facility] Patient safety goal and comply with the current [facility] policy.   The Pharmacy will defer all warfarin dose order changes and follow up of lab results to the prescriber unless an additional order to initiate a "pharmacy Coumadin consult" is placed.  DESCRIPTION:  While hospitalized, to be in compliance with The [employer] Commission National Patient Safety Goals, all patients on warfarin must have a baseline and/or current protime prior to the administration of warfarin. Pharmacy has received your order for warfarin without these required laboratory assessments.  Thanks Luetta Nutting PharmD, BCPS  11/07/2011, 6:10 AM

## 2011-11-07 NOTE — ED Notes (Signed)
Patient transported to [facility] via security, accompanied by nurse tech. No distress noted.

## 2011-11-07 NOTE — ED Notes (Signed)
Report called to Erskine Squibb at [facility]. Patient calm and cooperative; no s/s of distress noted. Security notified of need for transportation.

## 2011-11-08 ENCOUNTER — Encounter (HOSPITAL_COMMUNITY): Payer: Self-pay | Admitting: *Deleted

## 2011-11-08 DIAGNOSIS — F1420 Cocaine dependence, uncomplicated: Secondary | ICD-10-CM | POA: Diagnosis present

## 2011-11-08 DIAGNOSIS — F1994 Other psychoactive substance use, unspecified with psychoactive substance-induced mood disorder: Secondary | ICD-10-CM | POA: Diagnosis present

## 2011-11-08 LAB — PROTIME-INR
INR: 1.71 — ABNORMAL HIGH (ref 0.00–1.49)
Prothrombin Time: 19.5 seconds — ABNORMAL HIGH (ref 11.6–15.2)

## 2011-11-08 MED ORDER — ISOSORBIDE DINITRATE 20 MG PO TABS
20.0000 mg | ORAL_TABLET | Freq: Every day | ORAL | Status: DC
  Administered 2011-11-08 – 2011-11-12 (×5): 20 mg via ORAL
  Filled 2011-11-08 (×8): qty 1

## 2011-11-08 MED ORDER — ACETAMINOPHEN 325 MG PO TABS
650.0000 mg | ORAL_TABLET | Freq: Four times a day (QID) | ORAL | Status: DC | PRN
Start: 2011-11-08 — End: 2011-11-13

## 2011-11-08 MED ORDER — ALUM & MAG HYDROXIDE-SIMETH 200-200-20 MG/5ML PO SUSP: 30.0000 mL | ORAL | Status: DC | PRN

## 2011-11-08 MED ORDER — HYDRALAZINE HCL 25 MG PO TABS
37.5000 mg | ORAL_TABLET | Freq: Every day | ORAL | Status: DC
  Administered 2011-11-08 – 2011-11-12 (×5): 37.5 mg via ORAL
  Filled 2011-11-08 (×8): qty 1.5

## 2011-11-08 MED ORDER — PANTOPRAZOLE SODIUM 40 MG PO TBEC
40.0000 mg | DELAYED_RELEASE_TABLET | Freq: Every day | ORAL | Status: DC
  Administered 2011-11-08 – 2011-11-12 (×5): 40 mg via ORAL
  Filled 2011-11-08 (×7): qty 1

## 2011-11-08 MED ORDER — DIGOXIN 250 MCG PO TABS
0.2500 mg | ORAL_TABLET | Freq: Every day | ORAL | Status: DC
  Administered 2011-11-08 – 2011-11-12 (×5): 0.25 mg via ORAL
  Filled 2011-11-08 (×8): qty 1

## 2011-11-08 MED ORDER — METOPROLOL SUCCINATE ER 25 MG PO TB24
25.0000 mg | ORAL_TABLET | Freq: Every day | ORAL | Status: DC
  Administered 2011-11-08 – 2011-11-12 (×5): 25 mg via ORAL
  Filled 2011-11-08 (×8): qty 1

## 2011-11-08 MED ORDER — AMIODARONE HCL 200 MG PO TABS
200.0000 mg | ORAL_TABLET | Freq: Every day | ORAL | Status: DC
  Administered 2011-11-08 – 2011-11-12 (×5): 200 mg via ORAL
  Filled 2011-11-08 (×8): qty 1

## 2011-11-08 MED ORDER — NICOTINE 21 MG/24HR TD PT24
21.0000 mg | MEDICATED_PATCH | Freq: Every day | TRANSDERMAL | Status: DC
  Filled 2011-11-08: qty 1

## 2011-11-08 MED ORDER — HYDROXYZINE HCL 25 MG PO TABS
50.0000 mg | ORAL_TABLET | Freq: Every evening | ORAL | Status: DC | PRN
  Administered 2011-11-08 – 2011-11-12 (×6): 50 mg via ORAL
  Filled 2011-11-08 (×15): qty 1

## 2011-11-08 MED ORDER — METOLAZONE 2.5 MG PO TABS
2.5000 mg | ORAL_TABLET | Freq: Every day | ORAL | Status: DC
  Administered 2011-11-08 – 2011-11-12 (×5): 2.5 mg via ORAL
  Filled 2011-11-08 (×8): qty 1

## 2011-11-08 MED ORDER — SPIRONOLACTONE 25 MG PO TABS
25.0000 mg | ORAL_TABLET | Freq: Every day | ORAL | Status: DC
  Administered 2011-11-08 – 2011-11-12 (×5): 25 mg via ORAL
  Filled 2011-11-08 (×8): qty 1

## 2011-11-08 MED ORDER — CHLORDIAZEPOXIDE HCL 25 MG PO CAPS: 25.0000 mg | ORAL_CAPSULE | Freq: Four times a day (QID) | ORAL | Status: DC | PRN

## 2011-11-08 MED ORDER — WARFARIN - PHARMACIST DOSING INPATIENT
Freq: Every day | Status: DC
  Administered 2011-11-08 – 2011-11-12 (×3)
  Filled 2011-11-08 (×15): qty 1

## 2011-11-08 MED ORDER — ALLOPURINOL 100 MG PO TABS
100.0000 mg | ORAL_TABLET | Freq: Every day | ORAL | Status: DC
  Administered 2011-11-08 – 2011-11-11 (×4): 100 mg via ORAL
  Filled 2011-11-08 (×5): qty 1

## 2011-11-08 MED ORDER — MAGNESIUM HYDROXIDE 400 MG/5ML PO SUSP: 30.0000 mL | Freq: Every day | ORAL | Status: DC | PRN

## 2011-11-08 MED ORDER — WARFARIN SODIUM 10 MG PO TABS
10.0000 mg | ORAL_TABLET | Freq: Every evening | ORAL | Status: DC
  Administered 2011-11-08 – 2011-11-10 (×3): 10 mg via ORAL
  Filled 2011-11-08 (×4): qty 1

## 2011-11-08 MED ORDER — ISOSORB DINITRATE-HYDRALAZINE 20-37.5 MG PO TABS
1.0000 | ORAL_TABLET | Freq: Every day | ORAL | Status: DC
  Filled 2011-11-08 (×2): qty 1

## 2011-11-08 MED ORDER — POTASSIUM CHLORIDE CRYS ER 20 MEQ PO TBCR
20.0000 meq | EXTENDED_RELEASE_TABLET | Freq: Every day | ORAL | Status: DC
  Administered 2011-11-08 – 2011-11-13 (×6): 20 meq via ORAL
  Filled 2011-11-08 (×8): qty 1

## 2011-11-08 MED ORDER — ATORVASTATIN CALCIUM 10 MG PO TABS
10.0000 mg | ORAL_TABLET | Freq: Every day | ORAL | Status: DC
  Administered 2011-11-08 – 2011-11-12 (×5): 10 mg via ORAL
  Filled 2011-11-08 (×7): qty 1

## 2011-11-08 MED ORDER — FUROSEMIDE 80 MG PO TABS
80.0000 mg | ORAL_TABLET | Freq: Every day | ORAL | Status: DC
  Administered 2011-11-08 – 2011-11-12 (×5): 80 mg via ORAL
  Filled 2011-11-08 (×8): qty 1

## 2011-11-08 NOTE — Tx Team (Signed)
Interdisciplinary Treatment Plan Update (Adult)  Date:  11/08/2011  Time Reviewed:  9:55 AM   Progress in Treatment: Attending groups: Yes Participating in groups:  Yes Taking medication as prescribed: Yes Tolerating medication:  Yes Family/Significant othe contact made:  CSW assessing for appropriate contact Patient understands diagnosis:  Yes Discussing patient identified problems/goals with staff:  Yes Medical problems stabilized or resolved:  Yes Denies suicidal/homicidal ideation: Yes Issues/concerns per patient self-inventory:  None identified Other: N/A  New problem(s) identified: None Identified  Reason for Continuation of Hospitalization: Anxiety Depression Medication stabilization Withdrawal symptoms  Interventions implemented related to continuation of hospitalization: mood stabilization, medication monitoring and adjustment, group therapy and psycho education, safety checks q 15 mins  Additional comments: N/A  Estimated length of stay: 3-5 days  Discharge Plan: CSW is assessing for appropriate referrals.    New goal(s): N/A  Review of initial/current patient goals per problem list:    1.  Goal(s): Address substance use  Met:  No  Target date: by discharge  As evidenced by: completing detox protocol and refer to appropriate treatment  2.  Goal (s): Reduce depressive and anxiety symptoms  Met:  No  Target date: by discharge  As evidenced by: Reducing depression from a 10 to a 3 as reported by pt.    Attendees: Patient:     Family:     Physician: Geoffery Lyons, MD 11/08/2011 9:55 AM   Nursing: Roswell Miners, RN 11/08/2011 9:55 AM   Clinical Social Worker:  Reyes Ivan, LCSWA 11/08/2011  9:55 AM   Other: Dennison Nancy, RN 11/08/2011  9:55 AM   Other:  Nanine Means, NP 11/08/2011 9:58 AM   Other:  Liborio Nixon, RN 11/08/2011 9:59 AM   Other:  Bubba Camp, Psyc intern 11/08/2011 9:59 AM   Other:      Scribe for Treatment Team:   Reyes Ivan 11/08/2011  9:55 AM

## 2011-11-08 NOTE — Progress Notes (Signed)
Vol admit to the 300 hall requesting help to detox from crack cocaine which he relapsed on over the weekend during [employer] homecoming.  He reports being very depressed over his declining health and financial problems.   He also said he was having some passive suicidal thoughts, but denies those thoughts now.  He told them at the ED that his sister accused him of stealing from her and reported this to the police.  He lives with his sister and mother, and the sister is POA over their mother.  If he is found guilty of her charges, he could go to prison.  He did not mentioned his sister during the [facility] admission, and only stated he lived with his mother.  Pt has multiple health issues and has an implanted defibillator.  Pt was pleasant/cooperative with the admission process.  Paperwork signed and pt briefly oriented to unit/room.  Safety checks q15 minutes.  Pt was allowed to keep his tennis shoes with laces d/t his gout and unsteady gait.

## 2011-11-08 NOTE — [facility] Suicide Risk Assessment (Signed)
Suicide Risk Assessment  Admission Assessment     Nursing information obtained from:  Patient Demographic factors:  Male;Divorced or widowed;Low socioeconomic status;Unemployed Current Mental Status:  NA Loss Factors:  Decline in physical health;Financial problems / change in socioeconomic status Historical Factors:  NA Risk Reduction Factors:  Sense of responsibility to family;Religious beliefs about death;Living with another person, especially a relative;Positive social support  CLINICAL FACTORS:   Depression:   Comorbid alcohol abuse/dependence Alcohol/Substance Abuse/Dependencies Medical Diagnoses and Treatments/Surgeries  COGNITIVE FEATURES THAT CONTRIBUTE TO RISK: None   SUICIDE RISK:   Mild:  Suicidal ideation of limited frequency, intensity, duration, and specificity.  There are no identifiable plans, no associated intent, mild dysphoria and related symptoms, good self-control (both objective and subjective assessment), few other risk factors, and identifiable protective factors, including available and accessible social support.  PLAN OF CARE: Supportive approach/coping skills/relaspe prevention                               Reassess mood   Diar Berkel A 11/08/2011, 10:49 AM

## 2011-11-08 NOTE — Social Work (Signed)
Aftercare Planning Group: 11/08/2011 9:45 AM  Pt attended discharge planning group and actively participated in group.  CSW provided pt with today's workbook.  Pt presents with calm mood and affect.  Pt was open with sharing reason for entering the hospital.  Pt states that he had to move back home for help with his heart problems.  Pt states that he has a lot of conflict with his sister which causes him a lot of stress.  Pt states that relapsed on crack cocaine after being clean for 9 years.  Pt states that he has a lot of guilt and disappointment about his relapse.  Pt states that he can return home and has transportation.  Pt was open to following up with [facility] for medication management and therapy.  No further needs voiced by pt at this time.    [facility] Group Note : Clinical Social Worker Group Therapy  11/08/2011  1:15 PM  Type of Therapy:  Group Therapy  Participation Level:  Appropriate  Participation Quality:  Appropriate   Affect:  Appropriate  Cognitive:  Alert  Insight:  Good  Engagement in Group:  Good  Engagement in Therapy:  Good  Modes of Intervention:  Activity, Clarification, Education, Socialization and Support  Summary of Progress/Problems: The topic of group discussion today was emotion regulation and mindfulness.  CSW discussed being aware of thoughts and how they affect your mood, physical sensations and behaviors.  CSW discussed how pt can use this to stay in the moment and suspending judgment on self.  Pt actively participated in discussion and expressed how they could use this in their life.     Maycie Luera Horton, LCSWA 11/08/2011 3:00 pm
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
## 2011-11-08 NOTE — Progress Notes (Signed)
ANTICOAGULATION CONSULT NOTE - Initial Consult  Pharmacy Consult for Coumadin Indication: atrial fibrillation  No Known Allergies  Patient Measurements: Height: 6\' 3"  (190.5 cm) Weight: 221 lb (100.245 kg) IBW/kg (Calculated) : 84.5    Vital Signs: Temp: 98.2 F (36.8 C) (11/06 0600) Temp src: Oral (11/06 0600) BP: 113/83 mmHg (11/06 0829) Pulse Rate: 76  (11/06 0829)  Labs:  [name] 11/08/11 0620 11/07/11 0606 11/06/11 2250  HGB -- -- 13.4  HCT -- -- 39.1  PLT -- -- 214  APTT -- -- --  LABPROT 19.5* 18.2* 18.0*  INR 1.71* 1.56* 1.54*  HEPARINUNFRC -- -- --  CREATININE -- -- 1.47*  CKTOTAL -- -- --  CKMB -- -- --  TROPONINI -- -- --    Estimated Creatinine Clearance: 63.1 ml/min (by C-G formula based on Cr of 1.47).   Medical History: Past Medical History  Diagnosis Date  . CHF (congestive heart failure)   . Sarcoidosis   . Cardiomyopathy, dilated, nonischemic     non ischemic by cath  . Acute on chronic systolic heart failure   . Automatic implantable cardiac defibrillator in situ   . Atrial fibrillation   . NSVT (nonsustained ventricular tachycardia)   . GERD (gastroesophageal reflux disease)   . Hypercholesteremia   . Myocardial infarction   . Shortness of breath   . Chronic kidney disease (CKD), stage III (moderate)   . Pacemaker   . Anginal pain   . Gout   . Hypertension     [name]  . Coronary artery disease     Medications:  Scheduled:    . allopurinol  100 mg Oral Daily  . amiodarone  200 mg Oral Daily  . atorvastatin  10 mg Oral q1800  . digoxin  0.25 mg Oral Daily  . furosemide  80 mg Oral Daily  . isosorbide dinitrate  20 mg Oral Daily   And  . hydrALAZINE  37.5 mg Oral Daily  . hydrOXYzine  50 mg Oral QHS,MR X 1  . metolazone  2.5 mg Oral Daily  . metoprolol succinate  25 mg Oral Daily  . pantoprazole  40 mg Oral Q1200  . potassium chloride SA  20 mEq Oral Daily  . spironolactone  25 mg Oral Daily  . warfarin  10 mg  Oral QPM  . Warfarin - Pharmacist Dosing Inpatient   Does not apply q1800  . [DISCONTINUED] isosorbide-hydrALAZINE  1 tablet Oral Daily  . [DISCONTINUED] nicotine  21 mg Transdermal Q0600    Assessment: INR below goal.  No problems noted with therapy  Goal of Therapy:  INR 2-3    Plan:  Coumadin 10 mg po q1800 for now.  Will draw PT/INR in am labs.  Will f/u with patient in am.    [name] 11/08/2011,1:27 PM
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
## 2011-11-08 NOTE — Tx Team (Signed)
Initial Interdisciplinary Treatment Plan  PATIENT STRENGTHS: (choose at least two) Ability for insight Average or above average intelligence Capable of independent living Communication skills General fund of knowledge Motivation for treatment/growth Supportive family/friends  PATIENT STRESSORS: Financial difficulties Health problems Substance abuse   PROBLEM LIST: Problem List/Patient Goals Date to be addressed Date deferred Reason deferred Estimated date of resolution  Substance abuse-relapse on crack cocaine      Anxiety over finances      Depression      Multiple medical issues-declining health                                     DISCHARGE CRITERIA:  Ability to meet basic life and health needs Improved stabilization in mood, thinking, and/or behavior Medical problems require only outpatient monitoring Motivation to continue treatment in a less acute level of care Verbal commitment to aftercare and medication compliance  PRELIMINARY DISCHARGE PLAN: Attend aftercare/continuing care group Outpatient therapy Return to previous living arrangement  PATIENT/FAMIILY INVOLVEMENT: This treatment plan has been presented to and reviewed with the patient, [name], and/or family member.  The patient and family have been given the opportunity to ask questions and make suggestions.  [name] [facility] 11/08/2011, 1:49 AM

## 2011-11-09 LAB — PROTIME-INR: Prothrombin Time: 21.5 seconds — ABNORMAL HIGH (ref 11.6–15.2)

## 2011-11-09 MED ORDER — TRAMADOL HCL 50 MG PO TABS
50.0000 mg | ORAL_TABLET | Freq: Four times a day (QID) | ORAL | Status: DC | PRN
  Administered 2011-11-09 – 2011-11-12 (×8): 50 mg via ORAL
  Filled 2011-11-09 (×8): qty 1

## 2011-11-09 NOTE — Progress Notes (Signed)
[facility] MD Progress Note  11/09/2011 7:11 PM [name]  MRN:  [id number]  Diagnosis:  Cocaine Dependence, Substance induced mood disorder  ADL's:  Intact  Sleep: Fair  Appetite:  Fair  Suicidal Ideation:  Plan:  Deneis Intent:  denies Means:  Denies Homicidal Ideation:  Plan:  Denies Intent:  Denies Means:  denies  Has not communicated with his mother or sister. He is concerned about his sister influencing his mother against him. Would like to talk with her alone. He is committed to abstain. Says he is not going to allow himself to get to this point again. He is experiencing gout pain. Still worried about the charges, but hopes that the sister is going to eventually decide to drop them.  Mental Status Examination/Evaluation: Objective:  Appearance: Fairly Groomed  Patent attorney::  Fair  Speech:  Clear and Coherent  Volume:  Normal  Mood:  In pain (gout)  Affect:  Appropriate  Thought Process:  Coherent and Goal Directed  Orientation:  Full  Thought Content:  WDL  Suicidal Thoughts:  No  Homicidal Thoughts:  No  Memory:  Immediate;   Fair Recent;   Fair Remote;   Fair  Judgement:  Fair  Insight:  Present  Psychomotor Activity:  Normal  Concentration:  Fair  Recall:  Fair  Akathisia:  No  Handed:  Right  AIMS (if indicated):     Assets:  Desire for Improvement  Sleep:  Number of Hours: 6.75    Vital Signs:Blood pressure 103/71, pulse 60, temperature 98.1 F (36.7 C), temperature source Oral, resp. rate 18, height 6\' 3"  (1.905 m), weight 100.245 kg (221 lb), SpO2 99.00%. Current Medications: Current Facility-Administered Medications  Medication Dose Route Frequency Provider Last Rate Last Dose  . acetaminophen (TYLENOL) tablet 650 mg  650 mg Oral Q6H PRN Kerry Hough, PA      . allopurinol (ZYLOPRIM) tablet 100 mg  100 mg Oral Daily Kerry Hough, PA   100 mg at 11/09/11 0801  . alum & mag hydroxide-simeth (MAALOX/MYLANTA) 200-200-20 MG/5ML suspension 30 mL   30 mL Oral Q4H PRN Kerry Hough, PA      . amiodarone (PACERONE) tablet 200 mg  200 mg Oral Daily Kerry Hough, PA   200 mg at 11/09/11 0801  . atorvastatin (LIPITOR) tablet 10 mg  10 mg Oral q1800 Kerry Hough, PA   10 mg at 11/09/11 1722  . chlordiazePOXIDE (LIBRIUM) capsule 25 mg  25 mg Oral QID PRN Kerry Hough, PA      . digoxin (LANOXIN) tablet 0.25 mg  0.25 mg Oral Daily Kerry Hough, PA   0.25 mg at 11/09/11 0800  . furosemide (LASIX) tablet 80 mg  80 mg Oral Daily Kerry Hough, PA   80 mg at 11/09/11 0801  . isosorbide dinitrate (ISORDIL) tablet 20 mg  20 mg Oral Daily Rachael Fee, MD   20 mg at 11/09/11 0801   And  . hydrALAZINE (APRESOLINE) tablet 37.5 mg  37.5 mg Oral Daily Rachael Fee, MD   37.5 mg at 11/09/11 0800  . hydrOXYzine (ATARAX/VISTARIL) tablet 50 mg  50 mg Oral QHS,MR X 1 Kerry Hough, PA   50 mg at 11/08/11 2152  . magnesium hydroxide (MILK OF MAGNESIA) suspension 30 mL  30 mL Oral Daily PRN Kerry Hough, PA      . metolazone (ZAROXOLYN) tablet 2.5 mg  2.5 mg Oral Daily Kerry Hough, PA  2.5 mg at 11/09/11 0801  . metoprolol succinate (TOPROL-XL) 24 hr tablet 25 mg  25 mg Oral Daily Kerry Hough, PA   25 mg at 11/09/11 0801  . pantoprazole (PROTONIX) EC tablet 40 mg  40 mg Oral Q1200 Kerry Hough, PA   40 mg at 11/09/11 1206  . potassium chloride SA (K-DUR,KLOR-CON) CR tablet 20 mEq  20 mEq Oral Daily Kerry Hough, PA   20 mEq at 11/09/11 0800  . spironolactone (ALDACTONE) tablet 25 mg  25 mg Oral Daily Kerry Hough, PA   25 mg at 11/09/11 0801  . warfarin (COUMADIN) tablet 10 mg  10 mg Oral QPM Kerry Hough, PA   10 mg at 11/09/11 1722  . Warfarin - Pharmacist Dosing Inpatient   Does not apply q1800 Rachael Fee, MD        Lab Results:  Results for orders placed during the hospital encounter of 11/07/11 (from the past 48 hour(s))  PROTIME-INR     Status: Abnormal   Collection Time   11/08/11  6:20 AM      Component  Value Range Comment   Prothrombin Time 19.5 (*) 11.6 - 15.2 seconds    INR 1.71 (*) 0.00 - 1.49   PROTIME-INR     Status: Abnormal   Collection Time   11/09/11  6:25 AM      Component Value Range Comment   Prothrombin Time 21.5 (*) 11.6 - 15.2 seconds    INR 1.95 (*) 0.00 - 1.49     Physical Findings: AIMS: Facial and Oral Movements Muscles of Facial Expression: None, normal Lips and Perioral Area: None, normal Jaw: None, normal Tongue: None, normal,Extremity Movements Upper (arms, wrists, hands, fingers): None, normal Lower (legs, knees, ankles, toes): None, normal, Trunk Movements Neck, shoulders, hips: None, normal, Overall Severity Severity of abnormal movements (highest score from questions above): None, normal Incapacitation due to abnormal movements: None, normal Patient's awareness of abnormal movements (rate only patient's report): No Awareness, Dental Status Current problems with teeth and/or dentures?: No Does patient usually wear dentures?: No  CIWA:    COWS:  COWS Total Score: 1   Treatment Plan Summary: Daily contact with patient to assess and evaluate symptoms and progress in treatment Medication management  Plan: Supportive approach/coping skills/relapse prevention  Aditya Nastasi A 11/09/2011, 7:11 PM

## 2011-11-09 NOTE — Progress Notes (Signed)
ANTICOAGULATION CONSULT NOTE - Follow Up Consult  Pharmacy Consult for Coumadin Indication: atrial fibrillation  No Known Allergies  Patient Measurements:   Vital Signs: Temp: 98.1 F (36.7 C) (11/07 0600) BP: 103/71 mmHg (11/07 0601) Pulse Rate: 60  (11/07 0601)  Labs:  Alvira Philips 11/09/11 0625 11/08/11 0620 11/07/11 0606 11/06/11 2250  HGB -- -- -- 13.4  HCT -- -- -- 39.1  PLT -- -- -- 214  APTT -- -- -- --  LABPROT 21.5* 19.5* 18.2* --  INR 1.95* 1.71* 1.56* --  HEPARINUNFRC -- -- -- --  CREATININE -- -- -- 1.47*  CKTOTAL -- -- -- --  CKMB -- -- -- --  TROPONINI -- -- -- --    Estimated Creatinine Clearance: 63.1 ml/min (by C-G formula based on Cr of 1.47).   Medications:  Scheduled:    . allopurinol  100 mg Oral Daily  . amiodarone  200 mg Oral Daily  . atorvastatin  10 mg Oral q1800  . digoxin  0.25 mg Oral Daily  . furosemide  80 mg Oral Daily  . isosorbide dinitrate  20 mg Oral Daily   And  . hydrALAZINE  37.5 mg Oral Daily  . hydrOXYzine  50 mg Oral QHS,MR X 1  . metolazone  2.5 mg Oral Daily  . metoprolol succinate  25 mg Oral Daily  . pantoprazole  40 mg Oral Q1200  . potassium chloride SA  20 mEq Oral Daily  . spironolactone  25 mg Oral Daily  . warfarin  10 mg Oral QPM  . Warfarin - Pharmacist Dosing Inpatient   Does not apply q1800  . [DISCONTINUED] isosorbide-hydrALAZINE  1 tablet Oral Daily    Assessment: Patient's INR is rising.  Today it is 1.95  Goal of Therapy:  INR 2-3    Plan:  Will continue Coumadin 10 mg today and recheck INR in AM  Pamala Duffel L 11/09/2011,7:48 AM

## 2011-11-09 NOTE — Progress Notes (Signed)
D:Pt has been visible in milieu, limited interaction or participation and chose not to participate in evening group, has no complaints of depression today and no signs of any withdrawal, pt has received all medications without incident this evening. A: Pt provided with support and encouragement. R: Pt remains safe, will continue to monitor.

## 2011-11-09 NOTE — Progress Notes (Signed)
D:  Per pt self inventory pt slept fair, appetite is good, energy level is low, ability to pay attention is good, rates depression at a 1 out of 10 scale and hopelessness at a 1 out of 10 scale, denies SI/HI/AVH at this time, pt c/o gout pain in BLE.  A:  Encouraged pt to attend groups, Encouraged pt to talk with MD about gout pain, Pt received Allopurinol this am per MD orders  R:  Pt receptive to treatment, cooperative and pleasant with staff, pt states that his plan when he goes home is to talk his problems out.

## 2011-11-09 NOTE — Social Work (Signed)
[facility] Adult Inpatient Family/Significant Other Suicide Prevention Education  Suicide Prevention Education:   Patient Refusal for Family/Significant Other Suicide Prevention Education: The patient has refused to provide written consent for family/significant other to be provided Family/Significant Other Suicide Prevention Education during admission and/or prior to discharge.  Physician notified.  CSW provided suicide prevention information with patient.    The suicide prevention education provided includes the following:  Suicide risk factors  Suicide prevention and interventions  National Suicide Hotline telephone number  [name] assessment telephone number  [facility] Emergency Assistance 911  [facility] and/or Residential Mobile Crisis Unit telephone number   [name], [place] 11/09/2011 11:10 AM

## 2011-11-09 NOTE — Progress Notes (Signed)
Did not attend group 

## 2011-11-09 NOTE — Progress Notes (Signed)
Patient ID: [name], male   DOB: 1950/06/29, 62 y.o.   MRN: [id number] D: patient in bed sleeping. Respiration regular and unlabored. No sign of distress noted at this time A: 15 mins observation for safety R: patient remains asleep.

## 2011-11-09 NOTE — Social Work (Signed)
Aftercare Planning Group: 11/09/2011 9:45 AM  Pt attended discharge planning group and actively participated in group.  CSW provided pt with today's workbook.  Pt presents with agitated affect and mood.  Pt states that he is in a lot of pain today in his feet from gout.  Pt also was irritated with another pt and confronted him in group in an appropriate way.  The conflict appeared to be resolved between them.  Pt is scheduled to follow up with [facility] for medication management and therapy.  No further needs voiced by pt at this time.  Safety planning and suicide prevention discussed.  Pt participated in discussion and acknowledged an understanding of the information provided.       [facility] Group Note : Clinical Social Worker Group Therapy  11/09/2011  1:15 PM  Type of Therapy:  Group Therapy  Participation Level:  Did Not attend group on balance in life.  Betsie Peckman Horton, LCSWA 11/09/2011 3:00 pm

## 2011-11-10 NOTE — Progress Notes (Signed)
[facility] MD Progress Note  11/10/2011 1:20 PM [name]  MRN:  [id number]  Diagnosis:   Axis I: Cocaine Dependence, Substance Induced Mood Disorder Axis II: Deferred Axis III:  Past Medical History  Diagnosis Date  . CHF (congestive heart failure)   . Sarcoidosis   . Cardiomyopathy, dilated, nonischemic     non ischemic by cath  . Acute on chronic systolic heart failure   . Automatic implantable cardiac defibrillator in situ   . Atrial fibrillation   . NSVT (nonsustained ventricular tachycardia)   . GERD (gastroesophageal reflux disease)   . Hypercholesteremia   . Myocardial infarction   . Shortness of breath   . Chronic kidney disease (CKD), stage III (moderate)   . Pacemaker   . Anginal pain   . Gout   . Hypertension     [name]  . Coronary artery disease    Axis IV: economic problems, problems related to legal system/crime and problems with primary support group Axis V: 51-60 moderate symptoms  ADL's:  Intact  Sleep: Fair  Appetite:  Fair  Suicidal Ideation:  Plan:  Denies Intent:  Denies Means:  Denies Homicidal Ideation:  Plan:  Denies Intent:  Denies Means:  Denies  Endorses that he is still having a hard time. As the days go on, he has experienced more anger towards his sister. Admits that he has been having thought of wanting to hurt her. He initially was wanting to hurt him self. He was anticipating that if he was to be found guilty, he was going to get up to 27 years in prison. He was ready to end it. He saw his use of cocaine as a way out. He still doesn't  know how this is going to go, if the sister was not to drop the charges.  Mental Status Examination/Evaluation: Objective:  Appearance: Fairly Groomed  Patent attorney::  Fair  Speech:  Clear and Coherent  Volume:  Normal  Mood:  Worried  Affect:  Worried  Thought Process:  Coherent and Goal Directed  Orientation:  Full  Thought Content:  Worries and concerns about how he is going to react  to his sister  Suicidal Thoughts:  No  Homicidal Thoughts:  Yes.  without intent/plan  Memory:  Immediate;   Fair Recent;   Fair Remote;   Fair  Judgement:  Fair  Insight:  Present  Psychomotor Activity:  Normal  Concentration:  Fair  Recall:  Fair  Akathisia:  No  Handed:  Right  AIMS (if indicated):     Assets:  Desire for Improvement Financial Resources/Insurance  Sleep:  Number of Hours: 4.75    Vital Signs:Blood pressure 100/65, pulse 45, temperature 99.8 F (37.7 C), temperature source Oral, resp. rate 18, height 6\' 3"  (1.905 m), weight 100.245 kg (221 lb), SpO2 99.00%. Current Medications: Current Facility-Administered Medications  Medication Dose Route Frequency Provider Last Rate Last Dose  . acetaminophen (TYLENOL) tablet 650 mg  650 mg Oral Q6H PRN Kerry Hough, PA      . allopurinol (ZYLOPRIM) tablet 100 mg  100 mg Oral Daily Kerry Hough, PA   100 mg at 11/10/11 0829  . alum & mag hydroxide-simeth (MAALOX/MYLANTA) 200-200-20 MG/5ML suspension 30 mL  30 mL Oral Q4H PRN Kerry Hough, PA      . amiodarone (PACERONE) tablet 200 mg  200 mg Oral Daily Kerry Hough, PA   200 mg at 11/10/11 0829  . atorvastatin (LIPITOR) tablet 10 mg  10 mg Oral q1800 Kerry Hough, PA   10 mg at 11/09/11 1722  . chlordiazePOXIDE (LIBRIUM) capsule 25 mg  25 mg Oral QID PRN Kerry Hough, PA      . digoxin (LANOXIN) tablet 0.25 mg  0.25 mg Oral Daily Kerry Hough, PA   0.25 mg at 11/10/11 6578  . furosemide (LASIX) tablet 80 mg  80 mg Oral Daily Kerry Hough, PA   80 mg at 11/10/11 0829  . isosorbide dinitrate (ISORDIL) tablet 20 mg  20 mg Oral Daily Rachael Fee, MD   20 mg at 11/10/11 4696   And  . hydrALAZINE (APRESOLINE) tablet 37.5 mg  37.5 mg Oral Daily Rachael Fee, MD   37.5 mg at 11/10/11 2952  . hydrOXYzine (ATARAX/VISTARIL) tablet 50 mg  50 mg Oral QHS,MR X 1 Kerry Hough, PA   50 mg at 11/09/11 2220  . magnesium hydroxide (MILK OF MAGNESIA) suspension 30  mL  30 mL Oral Daily PRN Kerry Hough, PA      . metolazone (ZAROXOLYN) tablet 2.5 mg  2.5 mg Oral Daily Kerry Hough, PA   2.5 mg at 11/10/11 0829  . metoprolol succinate (TOPROL-XL) 24 hr tablet 25 mg  25 mg Oral Daily Kerry Hough, PA   25 mg at 11/10/11 8413  . pantoprazole (PROTONIX) EC tablet 40 mg  40 mg Oral Q1200 Kerry Hough, PA   40 mg at 11/10/11 1252  . potassium chloride SA (K-DUR,KLOR-CON) CR tablet 20 mEq  20 mEq Oral Daily Kerry Hough, PA   20 mEq at 11/10/11 2440  . spironolactone (ALDACTONE) tablet 25 mg  25 mg Oral Daily Kerry Hough, PA   25 mg at 11/10/11 1027  . traMADol (ULTRAM) tablet 50 mg  50 mg Oral Q6H PRN Rachael Fee, MD   50 mg at 11/10/11 2536  . warfarin (COUMADIN) tablet 10 mg  10 mg Oral QPM Kerry Hough, PA   10 mg at 11/09/11 1722  . Warfarin - Pharmacist Dosing Inpatient   Does not apply q1800 Rachael Fee, MD        Lab Results:  Results for orders placed during the hospital encounter of 11/07/11 (from the past 48 hour(s))  PROTIME-INR     Status: Abnormal   Collection Time   11/09/11  6:25 AM      Component Value Range Comment   Prothrombin Time 21.5 (*) 11.6 - 15.2 seconds    INR 1.95 (*) 0.00 - 1.49   PROTIME-INR     Status: Abnormal   Collection Time   11/10/11  6:27 AM      Component Value Range Comment   Prothrombin Time 24.7 (*) 11.6 - 15.2 seconds    INR 2.35 (*) 0.00 - 1.49     Physical Findings: AIMS: Facial and Oral Movements Muscles of Facial Expression: None, normal Lips and Perioral Area: None, normal Jaw: None, normal Tongue: None, normal,Extremity Movements Upper (arms, wrists, hands, fingers): None, normal Lower (legs, knees, ankles, toes): None, normal, Trunk Movements Neck, shoulders, hips: None, normal, Overall Severity Severity of abnormal movements (highest score from questions above): None, normal Incapacitation due to abnormal movements: None, normal Patient's awareness of abnormal movements  (rate only patient's report): No Awareness, Dental Status Current problems with teeth and/or dentures?: No Does patient usually wear dentures?: No  CIWA:    COWS:  COWS Total Score: 1   Treatment Plan Summary:  Daily contact with patient to assess and evaluate symptoms and progress in treatment Medication management Supportive approach/coping skills/relapse prevention  Plan: Help to process feelings towards sister and have a safety plan in place.   Arlee Santosuosso A 11/10/2011, 1:20 PM

## 2011-11-10 NOTE — Progress Notes (Signed)
D- Patient is quiet but appropriate. Did not attend early group. Attended late am groups.  Denies SI.  Rates depression at 1 and hopelessness at 2. Denies SI.  C/O gout pain at 8-9 that is unrelieved by currently prescribed prn pain medications.  Reports good sleep and appetite.  A- Instructed to inform provider of discomfort.  Support and encouragement given, Continue current POC and evaluation of treatment goals.  PRN medications requested and received.  R- Remains safe. Appropriate affect/behavior.

## 2011-11-10 NOTE — Progress Notes (Signed)
ANTICOAGULATION CONSULT NOTE - Follow Up Consult  Pharmacy Consult for Coumadin  Indication: atrial fibrillation  No Known Allergies  Patient Measurements:   Vital Signs: Temp: 99.8 F (37.7 C) (11/08 0700) BP: 93/68 mmHg (11/08 0700) Pulse Rate: 69  (11/08 0700)  Labs:  Alvira Philips 11/10/11 0627 11/09/11 0625 11/08/11 0620  HGB -- -- --  HCT -- -- --  PLT -- -- --  APTT -- -- --  LABPROT 24.7* 21.5* 19.5*  INR 2.35* 1.95* 1.71*  HEPARINUNFRC -- -- --  CREATININE -- -- --  CKTOTAL -- -- --  CKMB -- -- --  TROPONINI -- -- --    Estimated Creatinine Clearance: 63.1 ml/min (by C-G formula based on Cr of 1.47).   Medications:  Scheduled:    . allopurinol  100 mg Oral Daily  . amiodarone  200 mg Oral Daily  . atorvastatin  10 mg Oral q1800  . digoxin  0.25 mg Oral Daily  . furosemide  80 mg Oral Daily  . isosorbide dinitrate  20 mg Oral Daily   And  . hydrALAZINE  37.5 mg Oral Daily  . hydrOXYzine  50 mg Oral QHS,MR X 1  . metolazone  2.5 mg Oral Daily  . metoprolol succinate  25 mg Oral Daily  . pantoprazole  40 mg Oral Q1200  . potassium chloride SA  20 mEq Oral Daily  . spironolactone  25 mg Oral Daily  . warfarin  10 mg Oral QPM  . Warfarin - Pharmacist Dosing Inpatient   Does not apply q1800    Assessment: INR is therapeutic at 2.35   Goal of Therapy:  INR 2-3    Plan:  Will continue home dose of 10 mg Coumadin daily and recheck INR in AM.  Talked with patient and  Confirmed his home dose . Patient reports no signs of bleeding.  Pamala Duffel L 11/10/2011,8:31 AM

## 2011-11-10 NOTE — Progress Notes (Signed)
Patient ID: [name], male   DOB: 07/28/1950, 61 y.o.   MRN: [id number] D: Patient in dayroom on approach. Pt presented with anxiety and sad affect. Pt interacting appropriately with peers. Calm and cooperative with assessment. No acute distressed noted. Denies SI/HI/AV . Pt encouraged to come to staff with any question or concerns  A: Medication administered as prescribed. Safety has been maintained with Q15 minutes observation. Supported and encouragement provided to attend groups.  R: Patient remains safe. She/ he is complaint with medications and group programming. Safety has been maintained Q15 and continue current POC.

## 2011-11-10 NOTE — Social Work (Signed)
Aftercare Planning Group: 11/10/2011 9:45 AM  Pt attended discharge planning group and actively participated in group.  CSW provided pt with today's workbook.  Pt presents with calm mood and affect.  Pt denies having depression, anxiety and SI/HI.  Pt reports being in a lot of pain with his foot.  Pt has follow up scheduled at [facility] for medication management and therapy.  Pt also stated that he will follow up at [facility] meetings.  No further needs voiced by pt at this time.    [facility] Group Note : Clinical Social Worker Group Therapy  11/10/2011  1:15 PM  Type of Therapy:  Group Therapy  Participation Level:  Did Not Attend group on feelings towards relapse.    Adaya Garmany Horton, LCSWA 11/10/2011 3:00 pm

## 2011-11-10 NOTE — Progress Notes (Signed)
Psychoeducational Group Note  Date:  11/10/2011 Time: 2000  Group Topic/Focus:  AA group  Participation Level:  Active  Participation Quality:  Attentive  Affect:  Appropriate  Cognitive:  Appropriate  Insight:  Good  Engagement in Group:  Good  Additional Comments:  Pt. attended and participated in AA group  [name] 11/10/2011, 9:58 PM

## 2011-11-10 NOTE — Tx Team (Signed)
Interdisciplinary Treatment Plan Update (Adult)   Date: 11/10/2011  Time Reviewed: 9:54 AM   Progress in Treatment:  Attending groups: Yes  Participating in groups: Yes  Taking medication as prescribed: Yes  Tolerating medication: Yes  Family/Significant othe contact made: Pt refused Patient understands diagnosis: Yes  Discussing patient identified problems/goals with staff: Yes  Medical problems stabilized or resolved: Yes  Denies suicidal/homicidal ideation: Yes  Issues/concerns per patient self-inventory: None identified  Other: N/A  New problem(s) identified: None Identified   Reason for Continuation of Hospitalization:  Anxiety  Depression  Medication stabilization   Interventions implemented related to continuation of hospitalization: mood stabilization, medication monitoring and adjustment, group therapy and psycho education, safety checks q 15 mins   Additional comments: N/A   Estimated length of stay: 3-4 days   Discharge Plan: SW is assessing for appropriate referrals. Pt has follow up scheduled at [facility].   New goal(s): N/A   Review of initial/current patient goals per problem list:   1. Goal(s): Address substance use  Met: No  Target date: by discharge  As evidenced by: completing detox protocol and refer to appropriate treatment 2. Goal (s): Reduce depressive and anxiety symptoms  Met: Yes Target date: by discharge  As evidenced by: Reducing depression from a 10 to a 3 as reported by pt. Pt denies having depression and anxiety.      Attendees:  Patient:    Family:    Physician: Geoffery Lyons, MD  11/10/2011 9:54 AM   Nursing: Alease Frame, RN  11/10/2011 9:54 AM   Clinical Social Worker: Reyes Ivan, LCSWA  11/10/2011 9:54 AM   Other:  11/10/2011 9:54 AM   Other:    Other:    Other:    Other:    Scribe for Treatment Team:  Reyes Ivan 11/10/2011 9:54 AM

## 2011-11-10 NOTE — Progress Notes (Signed)
Psychoeducational Group Note  Date:  11/10/2011 Time:  1315  Group Topic/Focus:  Relapse Prevention Planning:   The focus of this group is to define relapse and discuss the need for planning to combat relapse.  Participation Level:  Active  Participation Quality:  Appropriate, Attentive, Sharing and Supportive  Affect:  Appropriate and Excited  Cognitive:  Alert and Appropriate  Insight:  Good  Engagement in Group:  Good  Additional Comments:  Pt was engaged in group discussion, sharing his feelings about his experience and time here at [facility] and what he has learned. Pt stated how he has gained knowledge not just from the staff but from the other pts during group and their experiences.   Dalia Heading 11/10/2011, 4:11 PM

## 2011-11-10 NOTE — Progress Notes (Signed)
Patient did not attend the evening karaoke group. Pt reported not feeling well, experiencing pain,  and wanted to sleep.

## 2011-11-10 NOTE — Progress Notes (Signed)
Patient ID: [name], male   DOB: 1950/07/17, 61 y.o.   MRN: [id number]  D: Patient in bed sleeping. Respiration regular and unlabored. No sign of distress noted at this time A: 15 mins observation for safety R: patient remains asleep.

## 2011-11-11 DIAGNOSIS — F1410 Cocaine abuse, uncomplicated: Secondary | ICD-10-CM

## 2011-11-11 MED ORDER — COLCHICINE 0.6 MG PO TABS
0.6000 mg | ORAL_TABLET | Freq: Two times a day (BID) | ORAL | Status: DC
  Filled 2011-11-11 (×2): qty 1

## 2011-11-11 MED ORDER — COLCHICINE 0.6 MG PO TABS
1.2000 mg | ORAL_TABLET | Freq: Once | ORAL | Status: AC
  Administered 2011-11-11: 1.2 mg via ORAL
  Filled 2011-11-11: qty 2

## 2011-11-11 MED ORDER — COLCHICINE 0.6 MG PO TABS
0.6000 mg | ORAL_TABLET | Freq: Two times a day (BID) | ORAL | Status: DC
  Administered 2011-11-12 – 2011-11-13 (×3): 0.6 mg via ORAL
  Filled 2011-11-11 (×7): qty 1

## 2011-11-11 MED ORDER — COLCHICINE 0.6 MG PO TABS
0.6000 mg | ORAL_TABLET | Freq: Once | ORAL | Status: AC
  Administered 2011-11-11: 0.6 mg via ORAL
  Filled 2011-11-11: qty 1

## 2011-11-11 NOTE — Progress Notes (Signed)
Psychoeducational Group Note  Date:  11/11/2011 Time:  1315  Group Topic/Focus:  Identifying Needs:   The focus of this group is to help patients identify their personal needs that have been historically problematic and identify healthy behaviors to address their needs.  Participation Level:  Did Not Attend  Participation Quality:     Affect:     Cognitive:    Insight:    Engagement in Group:    Additional Comments:    Cresenciano Lick 11/11/2011, 2:28 PM

## 2011-11-11 NOTE — Progress Notes (Signed)
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 CHF (congestive heart failure)   . Sarcoidosis   . Cardiomyopathy, dilated, nonischemic     non ischemic by cath  . Acute on chronic systolic heart failure   . Automatic implantable cardiac defibrillator in situ   . Atrial fibrillation   . NSVT (nonsustained ventricular tachycardia)   . GERD (gastroesophageal reflux disease)   . Hypercholesteremia   . Myocardial infarction   . Shortness of breath   . Chronic kidney disease (CKD), stage III (moderate)   . Pacemaker   . Anginal pain   . Gout   . Hypertension     [name]  . Coronary artery disease    Subjective: [name] is complaining of severe gout pain in his left foot. He reports that the pain disturbed his sleep last night. He denies any cravings for cocaine. He denies any suicidal or homicidal ideation. He denies any auditory or visual hallucinations. His appetite has been good.  ADL's:  Impaired  Sleep: Poor  Appetite:  Good  Suicidal Ideation:  Patient denies any thought, plan, or intent Homicidal Ideation:  Patient denies any thought, plan, or intent  AEB (as evidenced by):  Mental Status Examination/Evaluation: Objective:  Appearance: Disheveled  Eye Contact::  Fair  Speech:  Clear and Coherent  Volume:  Normal  Mood:  Anxious and Dysphoric  Affect:  Congruent  Thought Process:  Linear  Orientation:  Full  Thought Content:  WDL  Suicidal Thoughts:  No  Homicidal Thoughts:  No  Memory:  Immediate;   Good Recent;   Good Remote;   Good  Judgement:  Fair  Insight:  Fair  Psychomotor Activity:  Restlessness  Concentration:  Good  Recall:  Good  Akathisia:  No  Handed:    AIMS (if indicated):     Assets:  Communication Skills Desire for Improvement  Sleep:  Number of Hours:  4.5    Vital Signs:Blood pressure 97/61, pulse 55, temperature 97 F (36.1 C), temperature source Oral, resp. rate 16, height 6\' 3"  (1.905 m), weight 100.245 kg (221 lb), SpO2 99.00%. Current Medications: Current Facility-Administered Medications  Medication Dose Route Frequency Provider Last Rate Last Dose  . acetaminophen (TYLENOL) tablet 650 mg  650 mg Oral Q6H PRN Kerry Hough, PA      . alum & mag hydroxide-simeth (MAALOX/MYLANTA) 200-200-20 MG/5ML suspension 30 mL  30 mL Oral Q4H PRN Kerry Hough, PA      . amiodarone (PACERONE) tablet 200 mg  200 mg Oral Daily Kerry Hough, PA   200 mg at 11/11/11 9147  . atorvastatin (LIPITOR) tablet 10 mg  10 mg Oral q1800 Kerry Hough, PA   10 mg at 11/10/11 1715  . chlordiazePOXIDE (LIBRIUM) capsule 25 mg  25 mg Oral QID PRN Kerry Hough, PA      . colchicine tablet 0.6 mg  0.6 mg Oral BID Jorje Guild, PA-C      . colchicine tablet 1.2 mg  1.2 mg Oral Once Jorje Guild, PA-C      . digoxin (LANOXIN) tablet 0.25 mg  0.25 mg Oral Daily Kerry Hough, PA   0.25 mg at 11/11/11 0831  . furosemide (LASIX) tablet 80 mg  80 mg Oral Daily Kerry Hough, PA  80 mg at 11/11/11 0832  . isosorbide dinitrate (ISORDIL) tablet 20 mg  20 mg Oral Daily Rachael Fee, MD   20 mg at 11/11/11 0865   And  . hydrALAZINE (APRESOLINE) tablet 37.5 mg  37.5 mg Oral Daily Rachael Fee, MD   37.5 mg at 11/11/11 0831  . hydrOXYzine (ATARAX/VISTARIL) tablet 50 mg  50 mg Oral QHS,MR X 1 Kerry Hough, PA   50 mg at 11/10/11 2223  . magnesium hydroxide (MILK OF MAGNESIA) suspension 30 mL  30 mL Oral Daily PRN Kerry Hough, PA      . metolazone (ZAROXOLYN) tablet 2.5 mg  2.5 mg Oral Daily Kerry Hough, PA   2.5 mg at 11/11/11 0832  . metoprolol succinate (TOPROL-XL) 24 hr tablet 25 mg  25 mg Oral Daily Kerry Hough, PA   25 mg at 11/11/11 7846  . pantoprazole (PROTONIX) EC tablet 40 mg  40 mg Oral Q1200 Kerry Hough, PA   40 mg at 11/10/11 1252  .  potassium chloride SA (K-DUR,KLOR-CON) CR tablet 20 mEq  20 mEq Oral Daily Kerry Hough, PA   20 mEq at 11/11/11 9629  . spironolactone (ALDACTONE) tablet 25 mg  25 mg Oral Daily Kerry Hough, PA   25 mg at 11/11/11 5284  . traMADol (ULTRAM) tablet 50 mg  50 mg Oral Q6H PRN Rachael Fee, MD   50 mg at 11/11/11 1324  . Warfarin - Pharmacist Dosing Inpatient   Does not apply q1800 Rachael Fee, MD      . [DISCONTINUED] allopurinol (ZYLOPRIM) tablet 100 mg  100 mg Oral Daily Kerry Hough, PA   100 mg at 11/11/11 4010  . [DISCONTINUED] warfarin (COUMADIN) tablet 10 mg  10 mg Oral QPM Kerry Hough, PA   10 mg at 11/10/11 1715    Lab Results:  Results for orders placed during the hospital encounter of 11/07/11 (from the past 48 hour(s))  PROTIME-INR     Status: Abnormal   Collection Time   11/10/11  6:27 AM      Component Value Range Comment   Prothrombin Time 24.7 (*) 11.6 - 15.2 seconds    INR 2.35 (*) 0.00 - 1.49   PROTIME-INR     Status: Abnormal   Collection Time   11/11/11  6:47 AM      Component Value Range Comment   Prothrombin Time 29.6 (*) 11.6 - 15.2 seconds    INR 3.01 (*) 0.00 - 1.49     Physical Findings: AIMS: Facial and Oral Movements Muscles of Facial Expression: None, normal Lips and Perioral Area: None, normal Jaw: None, normal Tongue: None, normal,Extremity Movements Upper (arms, wrists, hands, fingers): None, normal Lower (legs, knees, ankles, toes): None, normal, Trunk Movements Neck, shoulders, hips: None, normal, Overall Severity Severity of abnormal movements (highest score from questions above): None, normal Incapacitation due to abnormal movements: None, normal Patient's awareness of abnormal movements (rate only patient's report): No Awareness, Dental Status Current problems with teeth and/or dentures?: No Does patient usually wear dentures?: No  CIWA:    COWS:  COWS Total Score: 1   Treatment Plan Summary: Daily contact with patient to  assess and evaluate symptoms and progress in treatment Medication management  Plan: We will stop his allopurinol, and start him on colchicine to treat his gouty arthritis. Otherwise we will continue his current plan of care. His recommended that in an addition to his church activities, he  get involved in 12-step recovery groups. Rhegan Trunnell 11/11/2011, 10:06 AM

## 2011-11-11 NOTE — Progress Notes (Signed)
D.  Pt pleasant and bright on approach, denies complaints.  Did want to take his tramadol at night for his gout pain so that he can sleep better.  Positive for group, see group notes.  Interacting appropriately within milieu.  Denies SI/HI/hallucinations at this time.  A.  Support and encouragement offered, medication given as ordered.  R.  No acute distress noted, will continue to monitor.

## 2011-11-11 NOTE — Progress Notes (Signed)
D-Patient is out in milieu interacting with peers with minimal group participation.  Rates depression at 1 and hopelessness at 2. Reports fair sleep and good appetite.  Continues to focus on left foot pain r/t gout.  Provider informed and new medications administered. A- Medication education done. Support and encouragement given. POC continued with evaluation of treatment goals. Denies SI. 15' checks cont for safety. R- Remains safe on unit. Appropriate affect/behavior.

## 2011-11-11 NOTE — Progress Notes (Signed)
ANTICOAGULATION CONSULT NOTE - Follow Up Consult  Pharmacy Consult for Coumadin  Indication: DVT  No Known Allergies  Patient Measurements: Height: 6\' 3"  (190.5 cm) Weight: 221 lb (100.245 kg) IBW/kg (Calculated) : 84.5    Vital Signs: Temp: 97 F (36.1 C) (11/09 0817) BP: 97/61 mmHg (11/09 0818) Pulse Rate: 55  (11/09 0818)  Labs:  Alvira Philips 11/11/11 1610 11/10/11 0627 11/09/11 0625  HGB -- -- --  HCT -- -- --  PLT -- -- --  APTT -- -- --  LABPROT 29.6* 24.7* 21.5*  INR 3.01* 2.35* 1.95*  HEPARINUNFRC -- -- --  CREATININE -- -- --  CKTOTAL -- -- --  CKMB -- -- --  TROPONINI -- -- --    Estimated Creatinine Clearance: 63.1 ml/min (by C-G formula based on Cr of 1.47).   Medications:  Scheduled:    . allopurinol  100 mg Oral Daily  . amiodarone  200 mg Oral Daily  . atorvastatin  10 mg Oral q1800  . digoxin  0.25 mg Oral Daily  . furosemide  80 mg Oral Daily  . isosorbide dinitrate  20 mg Oral Daily   And  . hydrALAZINE  37.5 mg Oral Daily  . hydrOXYzine  50 mg Oral QHS,MR X 1  . metolazone  2.5 mg Oral Daily  . metoprolol succinate  25 mg Oral Daily  . pantoprazole  40 mg Oral Q1200  . potassium chloride SA  20 mEq Oral Daily  . spironolactone  25 mg Oral Daily  . Warfarin - Pharmacist Dosing Inpatient   Does not apply q1800  . [DISCONTINUED] warfarin  10 mg Oral QPM    Assessment: INR has Jumped to 3.01  Goal of Therapy:  INR 2-3    Plan:  Will hold dose today and recheck INR in AM  Pamala Duffel L 11/11/2011,9:25 AM

## 2011-11-11 NOTE — Progress Notes (Signed)
Date: 11/11/2011  Time: 1315  Group Topic/Focus:  Identifying Needs Participation Level: Active  Participation Quality: Appropriate, Attentive and Sharing  Affect: Appropriate and Excited  Cognitive: Alert and Appropriate  Insight: Good  Engagement in Group: Good

## 2011-11-11 NOTE — Progress Notes (Signed)
Psychoeducational Group Note  Date:  11/11/2011 Time:  2000  Group Topic/Focus:  Wrap-Up Group:   The focus of this group is to help patients review their daily goal of treatment and discuss progress on daily workbooks.  Participation Level:  Active  Participation Quality:  Appropriate, Sharing and Supportive  Affect:  Appropriate  Cognitive:  Appropriate  Insight:  Good  Engagement in Group:  Good  Additional Comments:  none  Kassem Kibbe M 11/11/2011, 9:28 PM

## 2011-11-11 NOTE — Progress Notes (Signed)
[facility] Group Notes:  (Counselor/Nursing/MHT/Case Management/Adjunct)  11/11/2011 1:09 PM  Type of Therapy:  Group Therapy  Participation Level:  None  Participation Quality:  Drowsy  Affect:  Blunted  Cognitive:  Oriented  Insight:  None  Engagement in Group:  None  Engagement in Therapy:  None  Modes of Intervention:  Clarification  Summary of Progress/Problems: The patient was asleep for the entire group.   [name] [name] 11/11/2011, 1:09 PM

## 2011-11-11 NOTE — Progress Notes (Signed)
Psychoeducational Group Note  Date:  11/11/2011 Time:  1515  Group Topic/Focus:  Coping With Mental Health Crisis:   The purpose of this group is to help patients identify strategies for coping with mental health crisis.  Group discusses possible causes of crisis and ways to manage them effectively.  Participation Level:  Did Not Attend  Participation Quality:    Affect:    Cognitive:    Insight:    Engagement in Group:    Additional Comments:  Pt was in the bed sleeping  [name] M 11/11/2011, 5:53 PM

## 2011-11-12 DIAGNOSIS — F1910 Other psychoactive substance abuse, uncomplicated: Secondary | ICD-10-CM

## 2011-11-12 LAB — PROTIME-INR
INR: 2.96 — ABNORMAL HIGH (ref 0.00–1.49)
Prothrombin Time: 29.3 seconds — ABNORMAL HIGH (ref 11.6–15.2)

## 2011-11-12 MED ORDER — ONDANSETRON HCL 4 MG PO TABS
4.0000 mg | ORAL_TABLET | Freq: Three times a day (TID) | ORAL | Status: DC | PRN
  Administered 2011-11-12: 4 mg via ORAL

## 2011-11-12 MED ORDER — WARFARIN SODIUM 7.5 MG PO TABS
7.5000 mg | ORAL_TABLET | Freq: Once | ORAL | Status: AC
  Administered 2011-11-12: 7.5 mg via ORAL
  Filled 2011-11-12: qty 1

## 2011-11-12 MED ORDER — IBUPROFEN 800 MG PO TABS: 800.0000 mg | ORAL_TABLET | Freq: Four times a day (QID) | ORAL | Status: DC | PRN

## 2011-11-12 MED ORDER — SALINE SPRAY 0.65 % NA SOLN
1.0000 | NASAL | Status: DC | PRN
  Filled 2011-11-12: qty 44

## 2011-11-12 NOTE — Progress Notes (Signed)
Patient did attend the evening speaker AA meeting.  

## 2011-11-12 NOTE — Progress Notes (Signed)
D.  Pt experiencing some dizziness at beginning of shift, heartrate of 44, with other vital signs within normal limits. Pt does have a permanent pacemaker in place.  Pt did attend evening group, and was up in dayroom eating snack and interacting appropriately with peers.  No further distress noted.  Pt denies SI/HI/hallucinations at this time.  A.  Support and encouragement offered R.  Will continue to monitor.

## 2011-11-12 NOTE — Progress Notes (Signed)
I agree with this note.  

## 2011-11-12 NOTE — Clinical Social Work Note (Signed)
[facility] Group Notes:  (Clinical Social Work)  11/12/2011  10:00-11:00AM  Summary of Progress/Problems:   The main focus of today's process group was for the patient to identify ways to remain safe upon discharge and ways to prevent future hospitalizations.  It was also discussed how people may sabotage their own recovery, and reasons they may do this.  The patient expressed that his trigger is his son's mother, and that he needs to distance himself from her.  He has sabotaged himself by smoking crack at 7:30am when he was supposed to be at work by [employer].  He recognizes that the self-sabotage started much earlier than the drug use, and actually started when he though he no longer needed a sponsor.  He plans to obtain a new sponsor and go to 12-step groups regularly, knows exactly where he will do this.  Type of Therapy:  Group Therapy  Participation Level:  Active  Participation Quality:  Appropriate, Drowsy and Sharing  Affect:  Depressed  Cognitive:  Appropriate  Insight:  Good  Engagement in Group:  Limited  Engagement in Therapy:  Good  Modes of Intervention:  Clarification, Education, Limit-setting, Problem-solving, Socialization, Support and Processing   Ambrose Mantle, LCSW 11/12/2011, 12:44 PM

## 2011-11-12 NOTE — Progress Notes (Signed)
Psychoeducational Group Note  Date:  11/12/2011 Time:  1515  Group Topic/Focus:  Making Healthy Choices:   The focus of this group is to help patients identify negative/unhealthy choices they were using prior to admission and identify positive/healthier coping strategies to replace them upon discharge.  Participation Level:  Active  Participation Quality:  Appropriate, Attentive, Sharing and Supportive  Affect:  Appropriate  Cognitive:  Appropriate  Insight:  Good  Engagement in Group:  Good  Additional Comments:  Focus of this group was to discuss gratitude and what it means to each pt. Pt stated he was grateful for the negative things in his life as he has made the best out of the worst situations hes been in.    [name] 11/12/2011, 7:17 PM

## 2011-11-12 NOTE — Progress Notes (Signed)
[facility] MD Progress Note  11/12/2011 12:22 PM [name]  MRN:  [id number]  Diagnosis:   Axis I: Substance Abuse and Substance Induced Mood Disorder Axis II: Deferred Axis III:  Past Medical History  Diagnosis Date  . CHF (congestive heart failure)   . Sarcoidosis   . Cardiomyopathy, dilated, nonischemic     non ischemic by cath  . Acute on chronic systolic heart failure   . Automatic implantable cardiac defibrillator in situ   . Atrial fibrillation   . NSVT (nonsustained ventricular tachycardia)   . GERD (gastroesophageal reflux disease)   . Hypercholesteremia   . Myocardial infarction   . Shortness of breath   . Chronic kidney disease (CKD), stage III (moderate)   . Pacemaker   . Anginal pain   . Gout   . Hypertension     [name]  . Coronary artery disease    Axis IV: economic problems, occupational problems, other psychosocial or environmental problems and problems related to social environment Axis V: 41-50 serious symptoms  ADL's:  Intact  Sleep: Poor  Appetite:  Fair  Suicidal Ideation:  Denies Homicidal Ideation:  Denies  Mental Status Examination/Evaluation: Objective:  Appearance: Casual  Eye Contact::  Fair  Speech:  Normal Rate  Volume:  Normal  Mood:  Depressed  Affect:  Depressed  Thought Process:  Logical  Orientation:  Full  Thought Content:  WDL  Suicidal Thoughts:  No  Homicidal Thoughts:  No  Memory:  Immediate;   Fair Recent;   Fair Remote;   Fair  Judgement:  Fair  Insight:  Fair  Psychomotor Activity:  Decreased  Concentration:  Fair  Recall:  Fair  Akathisia:  No  Handed:  Right  AIMS (if indicated):     Assets:  Desire for Improvement Resilience  Sleep:  Number of Hours: 2.5    Vital Signs:Blood pressure 119/83, pulse 74, temperature 97.3 F (36.3 C), temperature source Oral, resp. rate 20, height 6\' 3"  (1.905 m), weight 100.245 kg (221 lb), SpO2 99.00%. Current Medications: Current Facility-Administered  Medications  Medication Dose Route Frequency Provider Last Rate Last Dose  . acetaminophen (TYLENOL) tablet 650 mg  650 mg Oral Q6H PRN Kerry Hough, PA      . alum & mag hydroxide-simeth (MAALOX/MYLANTA) 200-200-20 MG/5ML suspension 30 mL  30 mL Oral Q4H PRN Kerry Hough, PA      . amiodarone (PACERONE) tablet 200 mg  200 mg Oral Daily Kerry Hough, PA   200 mg at 11/12/11 0819  . atorvastatin (LIPITOR) tablet 10 mg  10 mg Oral q1800 Kerry Hough, PA   10 mg at 11/11/11 1647  . chlordiazePOXIDE (LIBRIUM) capsule 25 mg  25 mg Oral QID PRN Kerry Hough, PA      . [COMPLETED] colchicine tablet 0.6 mg  0.6 mg Oral Once Jorje Guild, PA-C   0.6 mg at 11/11/11 1647  . colchicine tablet 0.6 mg  0.6 mg Oral BID Jorje Guild, PA-C   0.6 mg at 11/12/11 0819  . digoxin (LANOXIN) tablet 0.25 mg  0.25 mg Oral Daily Kerry Hough, PA   0.25 mg at 11/12/11 0818  . furosemide (LASIX) tablet 80 mg  80 mg Oral Daily Kerry Hough, PA   80 mg at 11/12/11 0820  . isosorbide dinitrate (ISORDIL) tablet 20 mg  20 mg Oral Daily Rachael Fee, MD   20 mg at 11/12/11 4098   And  . hydrALAZINE (APRESOLINE) tablet  37.5 mg  37.5 mg Oral Daily Rachael Fee, MD   37.5 mg at 11/12/11 0819  . hydrOXYzine (ATARAX/VISTARIL) tablet 50 mg  50 mg Oral QHS,MR X 1 Kerry Hough, PA   50 mg at 11/12/11 0047  . ibuprofen (ADVIL,MOTRIN) tablet 800 mg  800 mg Oral Q6H PRN Nanine Means, NP      . magnesium hydroxide (MILK OF MAGNESIA) suspension 30 mL  30 mL Oral Daily PRN Kerry Hough, PA      . metolazone (ZAROXOLYN) tablet 2.5 mg  2.5 mg Oral Daily Kerry Hough, PA   2.5 mg at 11/12/11 0820  . metoprolol succinate (TOPROL-XL) 24 hr tablet 25 mg  25 mg Oral Daily Kerry Hough, PA   25 mg at 11/12/11 0819  . ondansetron (ZOFRAN) tablet 4 mg  4 mg Oral Q8H PRN Nanine Means, NP      . pantoprazole (PROTONIX) EC tablet 40 mg  40 mg Oral Q1200 Kerry Hough, PA   40 mg at 11/11/11 1111  . potassium chloride SA  (K-DUR,KLOR-CON) CR tablet 20 mEq  20 mEq Oral Daily Kerry Hough, PA   20 mEq at 11/12/11 1610  . spironolactone (ALDACTONE) tablet 25 mg  25 mg Oral Daily Kerry Hough, PA   25 mg at 11/12/11 0819  . traMADol (ULTRAM) tablet 50 mg  50 mg Oral Q6H PRN Rachael Fee, MD   50 mg at 11/12/11 9604  . warfarin (COUMADIN) tablet 7.5 mg  7.5 mg Oral ONCE-1800 Malva Cogan, [facility]      . Warfarin - Pharmacist Dosing Inpatient   Does not apply q1800 Rachael Fee, MD        Lab Results:  Results for orders placed during the hospital encounter of 11/07/11 (from the past 48 hour(s))  PROTIME-INR     Status: Abnormal   Collection Time   11/11/11  6:47 AM      Component Value Range Comment   Prothrombin Time 29.6 (*) 11.6 - 15.2 seconds    INR 3.01 (*) 0.00 - 1.49   PROTIME-INR     Status: Abnormal   Collection Time   11/12/11  6:55 AM      Component Value Range Comment   Prothrombin Time 29.3 (*) 11.6 - 15.2 seconds    INR 2.96 (*) 0.00 - 1.49     Physical Findings: AIMS: Facial and Oral Movements Muscles of Facial Expression: None, normal Lips and Perioral Area: None, normal Jaw: None, normal Tongue: None, normal,Extremity Movements Upper (arms, wrists, hands, fingers): None, normal Lower (legs, knees, ankles, toes): None, normal, Trunk Movements Neck, shoulders, hips: None, normal, Overall Severity Severity of abnormal movements (highest score from questions above): None, normal Incapacitation due to abnormal movements: None, normal Patient's awareness of abnormal movements (rate only patient's report): No Awareness, Dental Status Current problems with teeth and/or dentures?: No Does patient usually wear dentures?: No  CIWA:    COWS:  COWS Total Score: 1   Treatment Plan Summary: Daily contact with patient to assess and evaluate symptoms and progress in treatment Medication management  Plan:  Individual and group therapy, patient complaining of generalized discomfort and  nausea-advised not to eat sausage or bacon for breakfast due to heavy fat and grease content, ibuprofen and Zofran PRN ordered for his discomfort, fluids encouraged, denies suicidal/homicidal ideations and auditory and visual hallucinations, monitoring continues  Nanine Means, NP 11/12/2011, 12:22 PM

## 2011-11-12 NOTE — Progress Notes (Signed)
Psychoeducational Group Note  Date:  11/12/2011 Time:  0915  Group Topic/Focus:  Spirituality:   The focus of this group is to discuss how one's spirituality can aide in recovery.  Participation Level:  Active  Participation Quality:  Appropriate, Attentive, Sharing and Supportive  Affect:  Appropriate  Cognitive:  Appropriate  Insight:  Good  Engagement in Group:  Good  Additional Comments:  Pt attended and participated in group. Pt stated that he feels people go through many different cycles, just like the seasons, just at different times. Pt stated that with this it is a part of human nature and allows each person to grow and change, taking what they have experienced and try to give(share) it with others. Pt stated to his peers that he believes no one in the group needs to change who they are, that they need to keep a part of them, especially the part that give and care for people in their lives.    Dalia Heading 11/12/2011, 10:56 AM

## 2011-11-12 NOTE — Progress Notes (Signed)
ANTICOAGULATION CONSULT NOTE - Follow Up Consult  Pharmacy Consult for Coumadin Indication: atrial fibrillation  No Known Allergies  Patient Measurements: Height: 6\' 3"  (190.5 cm) Weight: 221 lb (100.245 kg) IBW/kg (Calculated) : 84.5    Vital Signs: Temp: 97.3 F (36.3 C) (11/10 0630) Temp src: Oral (11/10 0630) BP: 119/83 mmHg (11/10 0630) Pulse Rate: 74  (11/10 0630)  Labs:  Basename 11/12/11 0655 11/11/11 0647 11/10/11 0627  HGB -- -- --  HCT -- -- --  PLT -- -- --  APTT -- -- --  LABPROT 29.3* 29.6* 24.7*  INR 2.96* 3.01* 2.35*  HEPARINUNFRC -- -- --  CREATININE -- -- --  CKTOTAL -- -- --  CKMB -- -- --  TROPONINI -- -- --    Estimated Creatinine Clearance: 63.1 ml/min (by C-G formula based on Cr of 1.47).   Medications:  Scheduled:    . amiodarone  200 mg Oral Daily  . atorvastatin  10 mg Oral q1800  . [COMPLETED] colchicine  0.6 mg Oral Once  . colchicine  0.6 mg Oral BID  . [COMPLETED] colchicine  1.2 mg Oral Once  . digoxin  0.25 mg Oral Daily  . furosemide  80 mg Oral Daily  . isosorbide dinitrate  20 mg Oral Daily   And  . hydrALAZINE  37.5 mg Oral Daily  . hydrOXYzine  50 mg Oral QHS,MR X 1  . metolazone  2.5 mg Oral Daily  . metoprolol succinate  25 mg Oral Daily  . pantoprazole  40 mg Oral Q1200  . potassium chloride SA  20 mEq Oral Daily  . spironolactone  25 mg Oral Daily  . warfarin  7.5 mg Oral ONCE-1800  . Warfarin - Pharmacist Dosing Inpatient   Does not apply q1800  . [DISCONTINUED] allopurinol  100 mg Oral Daily  . [DISCONTINUED] colchicine  0.6 mg Oral BID  . [DISCONTINUED] warfarin  10 mg Oral QPM    Assessment: INR 2.96 after holding dose yesterday.  Goal of Therapy:  INR 2-3    Plan:  Will start patient back on Coumadin 7.5 mg today and recheck INR in AM.  Pamala Duffel L 11/12/2011,8:22 AM

## 2011-11-12 NOTE — Progress Notes (Signed)
Psychoeducational Group Note  Date: 11/12/2011  Time: 1315  Group Topic/Focus:  Healthy Support Systems  Participation Level: Active  Participation Quality: Appropriate, Attentive and Sharing  Affect: Appropriate  Cognitive: Alert and Appropriate  Insight: Good  Engagement in Group: Good   

## 2011-11-13 ENCOUNTER — Encounter (HOSPITAL_COMMUNITY): Payer: Self-pay | Admitting: *Deleted

## 2011-11-13 ENCOUNTER — Observation Stay (HOSPITAL_COMMUNITY)
Admission: AD | Admit: 2011-11-13 | Discharge: 2011-11-16 | Disposition: A | Discharge status: Home / Self Care | Payer: PRIVATE HEALTH INSURANCE | Source: Ambulatory Visit | Attending: Internal Medicine | Admitting: Internal Medicine

## 2011-11-13 ENCOUNTER — Observation Stay (HOSPITAL_COMMUNITY): Payer: PRIVATE HEALTH INSURANCE

## 2011-11-13 ENCOUNTER — Other Ambulatory Visit: Payer: Self-pay

## 2011-11-13 DIAGNOSIS — R5383 Other fatigue: Secondary | ICD-10-CM

## 2011-11-13 DIAGNOSIS — Z950 Presence of cardiac pacemaker: Secondary | ICD-10-CM | POA: Insufficient documentation

## 2011-11-13 DIAGNOSIS — I959 Hypotension, unspecified: Secondary | ICD-10-CM

## 2011-11-13 DIAGNOSIS — R531 Weakness: Secondary | ICD-10-CM

## 2011-11-13 DIAGNOSIS — I509 Heart failure, unspecified: Secondary | ICD-10-CM | POA: Insufficient documentation

## 2011-11-13 DIAGNOSIS — R001 Bradycardia, unspecified: Secondary | ICD-10-CM

## 2011-11-13 DIAGNOSIS — R112 Nausea with vomiting, unspecified: Secondary | ICD-10-CM | POA: Insufficient documentation

## 2011-11-13 DIAGNOSIS — F1994 Other psychoactive substance use, unspecified with psychoactive substance-induced mood disorder: Secondary | ICD-10-CM

## 2011-11-13 DIAGNOSIS — Z95810 Presence of automatic (implantable) cardiac defibrillator: Secondary | ICD-10-CM

## 2011-11-13 DIAGNOSIS — I5022 Chronic systolic (congestive) heart failure: Secondary | ICD-10-CM | POA: Insufficient documentation

## 2011-11-13 DIAGNOSIS — I4891 Unspecified atrial fibrillation: Secondary | ICD-10-CM

## 2011-11-13 DIAGNOSIS — I129 Hypertensive chronic kidney disease with stage 1 through stage 4 chronic kidney disease, or unspecified chronic kidney disease: Secondary | ICD-10-CM | POA: Insufficient documentation

## 2011-11-13 DIAGNOSIS — R197 Diarrhea, unspecified: Secondary | ICD-10-CM

## 2011-11-13 DIAGNOSIS — R5381 Other malaise: Secondary | ICD-10-CM

## 2011-11-13 DIAGNOSIS — I951 Orthostatic hypotension: Secondary | ICD-10-CM

## 2011-11-13 DIAGNOSIS — R079 Chest pain, unspecified: Secondary | ICD-10-CM | POA: Insufficient documentation

## 2011-11-13 DIAGNOSIS — I498 Other specified cardiac arrhythmias: Secondary | ICD-10-CM

## 2011-11-13 DIAGNOSIS — R0789 Other chest pain: Secondary | ICD-10-CM

## 2011-11-13 DIAGNOSIS — Z79899 Other long term (current) drug therapy: Secondary | ICD-10-CM | POA: Insufficient documentation

## 2011-11-13 DIAGNOSIS — I5021 Acute systolic (congestive) heart failure: Secondary | ICD-10-CM

## 2011-11-13 DIAGNOSIS — K219 Gastro-esophageal reflux disease without esophagitis: Secondary | ICD-10-CM | POA: Insufficient documentation

## 2011-11-13 DIAGNOSIS — F1420 Cocaine dependence, uncomplicated: Secondary | ICD-10-CM

## 2011-11-13 DIAGNOSIS — R072 Precordial pain: Secondary | ICD-10-CM

## 2011-11-13 DIAGNOSIS — Z7901 Long term (current) use of anticoagulants: Secondary | ICD-10-CM | POA: Insufficient documentation

## 2011-11-13 DIAGNOSIS — E7800 Pure hypercholesterolemia, unspecified: Secondary | ICD-10-CM

## 2011-11-13 DIAGNOSIS — I10 Essential (primary) hypertension: Secondary | ICD-10-CM

## 2011-11-13 DIAGNOSIS — N179 Acute kidney failure, unspecified: Secondary | ICD-10-CM

## 2011-11-13 DIAGNOSIS — I428 Other cardiomyopathies: Secondary | ICD-10-CM

## 2011-11-13 DIAGNOSIS — E739 Lactose intolerance, unspecified: Secondary | ICD-10-CM

## 2011-11-13 DIAGNOSIS — I5023 Acute on chronic systolic (congestive) heart failure: Secondary | ICD-10-CM

## 2011-11-13 DIAGNOSIS — N182 Chronic kidney disease, stage 2 (mild): Secondary | ICD-10-CM | POA: Diagnosis present

## 2011-11-13 DIAGNOSIS — I472 Ventricular tachycardia: Secondary | ICD-10-CM

## 2011-11-13 DIAGNOSIS — I420 Dilated cardiomyopathy: Secondary | ICD-10-CM

## 2011-11-13 LAB — COMPREHENSIVE METABOLIC PANEL
ALT: 49 U/L (ref 0–53)
Alkaline Phosphatase: 76 U/L (ref 39–117)
BUN: 48 mg/dL — ABNORMAL HIGH (ref 6–23)
GFR calc Af Amer: 30 mL/min — ABNORMAL LOW (ref >90)
GFR calc non Af Amer: 26 mL/min — ABNORMAL LOW (ref >90)
Glucose, Bld: 88 mg/dL (ref 70–99)
Total Bilirubin: 0.4 mg/dL (ref 0.3–1.2)
Total Protein: 8.4 g/dL — ABNORMAL HIGH (ref 6.0–8.3)

## 2011-11-13 LAB — PROTIME-INR: Prothrombin Time: 26.4 seconds — ABNORMAL HIGH (ref 11.6–15.2)

## 2011-11-13 LAB — CBC
MCHC: 34.5 g/dL (ref 30.0–36.0)
Platelets: 266 10*3/uL (ref 150–400)
RDW: 13 % (ref 11.5–15.5)
WBC: 4.2 10*3/uL (ref 4.0–10.5)

## 2011-11-13 LAB — PRO B NATRIURETIC PEPTIDE: Pro B Natriuretic peptide (BNP): 511.7 pg/mL — ABNORMAL HIGH (ref 0–125)

## 2011-11-13 LAB — TROPONIN I: Troponin I: <0.3 ng/mL (ref <0.30)

## 2011-11-13 MED ORDER — METOLAZONE 5 MG PO TABS: 2.5000 mg | ORAL_TABLET | Freq: Every day | ORAL | Status: DC

## 2011-11-13 MED ORDER — WARFARIN - PHARMACIST DOSING INPATIENT: Freq: Every day | Status: DC

## 2011-11-13 MED ORDER — ONDANSETRON HCL 4 MG/2ML IJ SOLN
4.0000 mg | Freq: Four times a day (QID) | INTRAMUSCULAR | Status: DC | PRN
  Administered 2011-11-13: 4 mg via INTRAVENOUS
  Filled 2011-11-13: qty 2

## 2011-11-13 MED ORDER — TRAMADOL HCL 50 MG PO TABS: 50.0000 mg | ORAL_TABLET | Freq: Four times a day (QID) | ORAL | Status: DC | PRN

## 2011-11-13 MED ORDER — SODIUM CHLORIDE 0.9 % IJ SOLN
3.0000 mL | Freq: Two times a day (BID) | INTRAMUSCULAR | Status: DC
  Administered 2011-11-13 – 2011-11-15 (×3): 3 mL via INTRAVENOUS

## 2011-11-13 MED ORDER — SENNOSIDES-DOCUSATE SODIUM 8.6-50 MG PO TABS
1.0000 | ORAL_TABLET | Freq: Every evening | ORAL | Status: DC | PRN
  Filled 2011-11-13: qty 1

## 2011-11-13 MED ORDER — HYDROXYZINE HCL 50 MG PO TABS: 50.0000 mg | ORAL_TABLET | Freq: Every evening | ORAL | Status: DC | PRN

## 2011-11-13 MED ORDER — COLCHICINE 0.6 MG PO TABS
0.6000 mg | ORAL_TABLET | Freq: Two times a day (BID) | ORAL | Status: DC
  Administered 2011-11-13 – 2011-11-16 (×6): 0.6 mg via ORAL
  Filled 2011-11-13 (×7): qty 1

## 2011-11-13 MED ORDER — WARFARIN SODIUM 7.5 MG PO TABS
7.5000 mg | ORAL_TABLET | Freq: Once | ORAL | Status: AC
  Administered 2011-11-13: 7.5 mg via ORAL
  Filled 2011-11-13: qty 1

## 2011-11-13 MED ORDER — COLCHICINE 0.6 MG PO TABS: 0.6000 mg | ORAL_TABLET | Freq: Two times a day (BID) | ORAL | Status: DC

## 2011-11-13 MED ORDER — POTASSIUM CHLORIDE CRYS ER 20 MEQ PO TBCR: 20.0000 meq | EXTENDED_RELEASE_TABLET | Freq: Every day | ORAL | Status: DC

## 2011-11-13 MED ORDER — WARFARIN SODIUM 10 MG PO TABS: 10.0000 mg | ORAL_TABLET | Freq: Every evening | ORAL | Status: DC

## 2011-11-13 MED ORDER — ONDANSETRON HCL 4 MG PO TABS: 4.0000 mg | ORAL_TABLET | Freq: Four times a day (QID) | ORAL | Status: DC | PRN

## 2011-11-13 MED ORDER — FUROSEMIDE 80 MG PO TABS: 80.0000 mg | ORAL_TABLET | Freq: Every day | ORAL | Status: DC

## 2011-11-13 MED ORDER — PANTOPRAZOLE SODIUM 40 MG PO TBEC
40.0000 mg | DELAYED_RELEASE_TABLET | Freq: Every day | ORAL | Status: DC
  Administered 2011-11-14 – 2011-11-16 (×3): 40 mg via ORAL
  Filled 2011-11-13 (×3): qty 1

## 2011-11-13 MED ORDER — ACETAMINOPHEN 325 MG PO TABS
650.0000 mg | ORAL_TABLET | Freq: Four times a day (QID) | ORAL | Status: DC | PRN
Start: 2011-11-13 — End: 2011-11-16

## 2011-11-13 MED ORDER — ACETAMINOPHEN 650 MG RE SUPP: 650.0000 mg | Freq: Four times a day (QID) | RECTAL | Status: DC | PRN

## 2011-11-13 MED ORDER — ROSUVASTATIN CALCIUM 5 MG PO TABS: 5.0000 mg | ORAL_TABLET | Freq: Every day | ORAL | Status: DC

## 2011-11-13 MED ORDER — SPIRONOLACTONE 25 MG PO TABS: 25.0000 mg | ORAL_TABLET | Freq: Every day | ORAL | Status: DC

## 2011-11-13 MED ORDER — METOPROLOL SUCCINATE ER 25 MG PO TB24: 25.0000 mg | ORAL_TABLET | Freq: Every day | ORAL | Status: DC

## 2011-11-13 MED ORDER — AMIODARONE HCL 200 MG PO TABS: 200.0000 mg | ORAL_TABLET | Freq: Every day | ORAL | Status: DC

## 2011-11-13 MED ORDER — TRAMADOL HCL 50 MG PO TABS
50.0000 mg | ORAL_TABLET | Freq: Four times a day (QID) | ORAL | Status: DC | PRN
  Administered 2011-11-14 – 2011-11-16 (×3): 50 mg via ORAL
  Filled 2011-11-13 (×3): qty 1

## 2011-11-13 MED ORDER — ATORVASTATIN CALCIUM 10 MG PO TABS
10.0000 mg | ORAL_TABLET | Freq: Every day | ORAL | Status: DC
  Administered 2011-11-14 – 2011-11-15 (×2): 10 mg via ORAL
  Filled 2011-11-13 (×3): qty 1

## 2011-11-13 MED ORDER — SODIUM CHLORIDE 0.9 % IV SOLN
INTRAVENOUS | Status: AC
  Administered 2011-11-13: 22:00:00 via INTRAVENOUS

## 2011-11-13 MED ORDER — ASPIRIN EC 81 MG PO TBEC
81.0000 mg | DELAYED_RELEASE_TABLET | Freq: Every day | ORAL | Status: DC
  Administered 2011-11-13 – 2011-11-16 (×4): 81 mg via ORAL
  Filled 2011-11-13 (×4): qty 1

## 2011-11-13 MED ORDER — PANTOPRAZOLE SODIUM 40 MG PO TBEC: 40.0000 mg | DELAYED_RELEASE_TABLET | Freq: Every day | ORAL | Status: DC

## 2011-11-13 MED ORDER — DIGOXIN 250 MCG PO TABS: 0.2500 mg | ORAL_TABLET | Freq: Every day | ORAL | Status: DC

## 2011-11-13 MED ORDER — ISOSORB DINITRATE-HYDRALAZINE 20-37.5 MG PO TABS: 1.0000 | ORAL_TABLET | Freq: Every day | ORAL | Status: DC

## 2011-11-13 MED ORDER — AMIODARONE HCL 200 MG PO TABS
200.0000 mg | ORAL_TABLET | Freq: Every day | ORAL | Status: DC
  Administered 2011-11-14 – 2011-11-16 (×3): 200 mg via ORAL
  Filled 2011-11-13 (×3): qty 1

## 2011-11-13 NOTE — Progress Notes (Signed)
Patient ID: [name], male   DOB: [birth year], 61 y.o.   MRN: [id number]  Patient transported to [facility] via EMS. No acute distress noted at this time.

## 2011-11-13 NOTE — Progress Notes (Signed)
Patient ID: [name], male   DOB: 1950/03/09, 61 y.o.   MRN: [id number]  Report called to Marissa on [facility] unit of [facility]. Pt with no s/s of distress noted at this time.

## 2011-11-13 NOTE — Progress Notes (Signed)
ANTICOAGULATION CONSULT NOTE - Initial Consult  Pharmacy Consult for Coumadin Indication: atrial fibrillation  No Known Allergies  Patient Measurements: Height: 6\' 3"  (190.5 cm) Weight: 212 lb 15.4 oz (96.6 kg) IBW/kg (Calculated) : 84.5   Labs:  Basename 11/13/11 0645 11/12/11 0655 11/11/11 0647  HGB -- -- --  HCT -- -- --  PLT -- -- --  APTT -- -- --  LABPROT 26.4* 29.3* 29.6*  INR 2.58* 2.96* 3.01*  HEPARINUNFRC -- -- --  CREATININE -- -- --  CKTOTAL -- -- --  CKMB -- -- --  TROPONINI -- -- --    Estimated Creatinine Clearance: 63.1 ml/min (by C-G formula based on Cr of 1.47).  Medical History: Past Medical History  Diagnosis Date  . CHF (congestive heart failure)   . Sarcoidosis   . Cardiomyopathy, dilated, nonischemic     non ischemic by cath  . Acute on chronic systolic heart failure   . Automatic implantable cardiac defibrillator in situ   . Atrial fibrillation   . NSVT (nonsustained ventricular tachycardia)   . GERD (gastroesophageal reflux disease)   . Hypercholesteremia   . Myocardial infarction   . Shortness of breath   . Chronic kidney disease (CKD), stage III (moderate)   . Pacemaker   . Anginal pain   . Gout   . Hypertension     [name]  . Coronary artery disease    Assessment:  61 yom on Coumadin PTA for h/o atrial fibrillation.  Pt's Coumadin was managed at [facility] by pharmacy and pt transferred to [facility] 11/11 secondary to weakness, hypotensive, bradycardia and diarrhea.  Pt reportedly takes Coumadin 10mg /day PTA (also on Amiodarone PTA). Per EPIC, INR was trending up on 10mg  daily and dose held 11/9 and Coumadin resumed 11/10 on a lower dose of 7.5mg .  INR today 2.58.    CBC pending    Goal of Therapy:  INR 2-3 Monitor platelets by anticoagulation protocol: Yes   Plan:   Coumadin 7.5mg  po x 1 tonight  Daily PT/INR  Pharmacy will f/u  Geoffry Paradise, PharmD, BCPS Pager: [phone] 7:18 PM Pharmacy #: [phone]

## 2011-11-13 NOTE — H&P (Signed)
[facility]  History and Physical  PCP:  HARWANI,MOHAN N, MD  Chief Complaint:  Weakness, fatigue, diarrhea  HPI:  61 y/o man with PMH significant for dilated cardiomyopathy with EF of 20-25%, a fib anticoagulated on coumadin, CKD Stage III, was discharged from the hospital 10/10 by Dr. Harwani for CP. He was voluntarily admitted to [facility] on 11/6 for a cocaine relapse. Today he complained of feeling very weak and not even able to get OOB. Has been hypotensive with SBP in the 90s, bradycardic to the 40s. Has been having diarrhea since Saturday (2 days). Also c/o SSCP, non-radiating, not related to exertion. We were asked to see him in consultation. However, I believe the scope of his medical issues require that he be transferred to [facility] hospital. His PCP and cardiologist, Dr. Harwani, has been contacted and he will see the patient in consultation.  Allergies:  No Known Allergies  Past Medical History   Diagnosis  Date   .  CHF (congestive heart failure)    .  Sarcoidosis    .  Cardiomyopathy, dilated, nonischemic      non ischemic by cath   .  Acute on chronic systolic heart failure    .  Automatic implantable cardiac defibrillator in situ    .  Atrial fibrillation    .  NSVT (nonsustained ventricular tachycardia)    .  GERD (gastroesophageal reflux disease)    .  Hypercholesteremia    .  Myocardial infarction    .  Shortness of breath    .  Chronic kidney disease (CKD), stage III (moderate)    .  Pacemaker    .  Anginal pain    .  Gout    .  Hypertension      dr harwarnia   .  Coronary artery disease     Past Surgical History   Procedure  Date   .  Back surgery  1987     Ruptured disk repair   .  Pacemaker insertion      with ICD   .  Tee without cardioversion  01/17/2011     Procedure: TRANSESOPHAGEAL ECHOCARDIOGRAM (TEE); Surgeon: Ajay S Kadakia, MD; Location: [facility] ENDOSCOPY; Service: Cardiovascular; Laterality: N/A;   .  Cardioversion  01/17/2011     Procedure:  CARDIOVERSION; Surgeon: Ajay S Kadakia, MD; Location: [facility] ENDOSCOPY; Service: Cardiovascular; Laterality: N/A;   .  Cardiac catheterization    .  Insert / replace / remove pacemaker    .  Anterior cervical decomp/discectomy fusion  08/21/2011     Procedure: ANTERIOR CERVICAL DECOMPRESSION/DISCECTOMY FUSION 2 LEVELS; Surgeon: Mark C Yates, MD; Location: [facility] OR; Service: Orthopedics; Laterality: N/A; C5-6, C6-7 Anterior Cervical Discectomy and Fusion, allograft, plate    Prior to Admission medications   Medication  Sig  Start Date  End Date  Taking?  Authorizing Provider   amiodarone (PACERONE) 200 MG tablet  Take 1 tablet (200 mg total) by mouth daily.  11/13/11    Neil Mashburn, PA-C   colchicine 0.6 MG tablet  Take 1 tablet (0.6 mg total) by mouth 2 (two) times daily. For gout  11/13/11    Jamison Lord, NP   digoxin (LANOXIN) 0.25 MG tablet  Take 1 tablet (0.25 mg total) by mouth daily.  11/13/11    Neil Mashburn, PA-C   furosemide (LASIX) 80 MG tablet  Take 1 tablet (80 mg total) by mouth daily.  11/13/11    Neil Mashburn, PA-C   hydrOXYzine (ATARAX/VISTARIL)   50 MG tablet  Take 1 tablet (50 mg total) by mouth at bedtime and may repeat dose one time if needed.  11/13/11    Neil Mashburn, PA-C   isosorbide-hydrALAZINE (BIDIL) 20-37.5 MG per tablet  Take 1 tablet by mouth daily.  11/13/11    Neil Mashburn, PA-C   metolazone (ZAROXOLYN) 5 MG tablet  Take 0.5 tablets (2.5 mg total) by mouth daily.  11/13/11    Neil Mashburn, PA-C   metoprolol succinate (TOPROL-XL) 25 MG 24 hr tablet  Take 1 tablet (25 mg total) by mouth daily.  11/13/11    Neil Mashburn, PA-C   pantoprazole (PROTONIX) 40 MG tablet  Take 1 tablet (40 mg total) by mouth daily at 12 noon.  11/13/11    Neil Mashburn, PA-C   potassium chloride SA (KLOR-CON M20) 20 MEQ tablet  Take 1 tablet (20 mEq total) by mouth daily.  11/13/11    Neil Mashburn, PA-C   rosuvastatin (CRESTOR) 5 MG tablet  Take 1 tablet (5 mg total) by mouth at bedtime.   11/13/11    Neil Mashburn, PA-C   spironolactone (ALDACTONE) 25 MG tablet  Take 1 tablet (25 mg total) by mouth daily.  11/13/11    Neil Mashburn, PA-C   traMADol (ULTRAM) 50 MG tablet  Take 1 tablet (50 mg total) by mouth every 6 (six) hours as needed for pain.  11/13/11    Neil Mashburn, PA-C   warfarin (COUMADIN) 10 MG tablet  Take 1 tablet (10 mg total) by mouth every evening.  11/13/11    Neil Mashburn, PA-C   Social History:  reports that he has never smoked. He has never used smokeless tobacco. He reports that he uses illicit drugs ("Crack" cocaine and Marijuana). He reports that he does not drink alcohol.  Family History   Problem  Relation  Age of Onset   .  Heart disease     .  Heart failure     .  Stroke     .  Anesthesia problems  Neg Hx    .  Hypotension  Neg Hx    .  Malignant hyperthermia  Neg Hx    .  Pseudochol deficiency  Neg Hx     Review of Systems:  Constitutional: Denies fever, chills, diaphoresis, appetite change.  HEENT: Denies photophobia, eye pain, redness, hearing loss, ear pain, congestion, sore throat, rhinorrhea, sneezing, mouth sores, trouble swallowing, neck pain, neck stiffness and tinnitus.  Respiratory: Denies SOB, DOE, cough, and wheezing.  Cardiovascular: Denies palpitations and leg swelling.  Gastrointestinal: Denies abdominal pain, constipation, blood in stool and abdominal distention.  Genitourinary: Denies dysuria, urgency, frequency, hematuria, flank pain and difficulty urinating.  Musculoskeletal: Denies myalgias, back pain, joint swelling, arthralgias and gait problem.  Skin: Denies pallor, rash and wound.  Neurological: Denies dizziness, seizures, syncope, weakness, light-headedness, numbness and headaches.  Hematological: Denies adenopathy. Easy bruising, personal or family bleeding history  Psychiatric/Behavioral: Denies suicidal ideation, mood changes, confusion, nervousness, sleep disturbance and agitation  Physical Exam:  Blood pressure  96/61, pulse 46, temperature 97.4 F (36.3 C), temperature source Oral, resp. rate 20, height 6' 3" (1.905 m), weight 100.245 kg (221 lb), SpO2 99.00%.  Gen: AA Ox3, lying in bed. Feels very weak.  HEENT: [place]/AT/PERRL  Neck: supple, no JVD, no LAD, no bruits, no goiter.  CV: brady, regular, no M/R/G  Lungs: CTA B  Abd: S/NT/ND/+BS  Ext: no C/C/E +pedal pulses.  Neuro: grossly intact and non-focal.  Labs on   Admission:  Results for orders placed during the hospital encounter of 11/07/11 (from the past 48 hour(s))   PROTIME-INR Status: Abnormal    Collection Time    11/12/11 6:55 AM   Component  Value  Range  Comment    Prothrombin Time  29.3 (*)  11.6 - 15.2 seconds     INR  2.96 (*)  0.00 - 1.49    PROTIME-INR Status: Abnormal    Collection Time    11/13/11 6:45 AM   Component  Value  Range  Comment    Prothrombin Time  26.4 (*)  11.6 - 15.2 seconds     INR  2.58 (*)  0.00 - 1.49     Radiological Exams on Admission:  No results found.  Assessment/Plan  Principal Problem:  *Weakness generalized  Active Problems:  CARDIOMYOPATHY, DILATED  Chronic systolic CHF (congestive heart failure), NYHA class 2  Atrial fibrillation  Cocaine dependence  Substance induced mood disorder  Hypotension  Bradycardia  Diarrhea   Generalized Weakness  -Broad differential at this time as I have no lab tests or images.  -With his diarrhea and recent hospitalization feel important to r/o c diff.  -Will also consider other infectious causes: Check CXR, UA, blood cx.  -Could also be acute CHF: CXR and BNP ordered.  -Also check TSH to r/o hypothyroidism and B12 to r/o B12 deficiency.  -Given concurrent CP and cardiac history, will r/o ACS with serial troponins and EKG.  -Do not believe we need to consider CVA given the non-focality of his exam.  Hypotension  -Likely 2/2 diarrhea.  -Hold antihypertensive agents for now.  -Will gently rehydrate, given h/o EF of 20%, with NS @ 50 for 12 hours.    -Orthostatic VS have been ordered.  Chest Pain  -Serial troponins and EKG.  -Cardiology consult with Dr. Harwani has been requested.  Bradycardia  -Will hold offending meds including digoxin, metoprolol, lasix, metolazone, spironolactone.  -May also be contributing to his weakness.  Atrial Fibrillation  -Continue coumadin as dosed by pharmacy.  DVT Prophylaxis  -Already fully anticoagulated on warfarin with therapeutic INR.  Time Spent on Admission:  80 minutes  HERNANDEZ ACOSTA,ESTELA  [facility]  Pager: [phone]  11/13/2011, 4:31 PM  

## 2011-11-13 NOTE — Progress Notes (Signed)
Patient ID: [name], male   DOB: 12-23-1950, 61 y.o.   MRN: [id number]  Vitals taken as ordered by PA Mashburn. Taken both with dinamap and manually. Pt resting in bed with no s/s of distress at this time.

## 2011-11-13 NOTE — Progress Notes (Signed)
Patient ID: [name], male   DOB: Jul 25, 1950, 61 y.o.   MRN: [id number]  Problem: Polysubstance Abuse, Depression  D: Patient states he does not feel well enough to go home today and that he feels weak. Dr. Dub Mikes aware.  A: Monitor patient Q 15 minutes for safety, encourage staff/peer interaction and group participation. Administer medications as ordered by MD.  R: Patient denies SI or plans to harm himself at this time. Pt did not take his cardiac medications due to weakness. Consult placed by MD.

## 2011-11-13 NOTE — H&P (Signed)
[facility]  History and Physical  PCP:  Robynn Pane, MD  Chief Complaint:  Weakness, fatigue, diarrhea  HPI:  61 y/o man with PMH significant for dilated cardiomyopathy with EF of 20-25%, a fib anticoagulated on coumadin, CKD Stage III, was discharged from the hospital 10/10 by Dr. [name] for CP. He was voluntarily admitted to [facility] on 11/6 for a cocaine relapse. Today he complained of feeling very weak and not even able to get OOB. Has been hypotensive with SBP in the 90s, bradycardic to the 40s. Has been having diarrhea since Saturday (2 days). Also c/o SSCP, non-radiating, not related to exertion. We were asked to see him in consultation. However, I believe the scope of his medical issues require that he be transferred to [facility] hospital. His PCP and cardiologist, Dr. [name], has been contacted and he will see the patient in consultation.  Allergies:  No Known Allergies  Past Medical History   Diagnosis  Date   .  CHF (congestive heart failure)    .  Sarcoidosis    .  Cardiomyopathy, dilated, nonischemic      non ischemic by cath   .  Acute on chronic systolic heart failure    .  Automatic implantable cardiac defibrillator in situ    .  Atrial fibrillation    .  NSVT (nonsustained ventricular tachycardia)    .  GERD (gastroesophageal reflux disease)    .  Hypercholesteremia    .  Myocardial infarction    .  Shortness of breath    .  Chronic kidney disease (CKD), stage III (moderate)    .  Pacemaker    .  Anginal pain    .  Gout    .  Hypertension      [name]   .  Coronary artery disease     Past Surgical History   Procedure  Date   .  Back surgery  1987     Ruptured disk repair   .  Pacemaker insertion      with ICD   .  Tee without cardioversion  01/17/2011     Procedure: TRANSESOPHAGEAL ECHOCARDIOGRAM (TEE); Surgeon: Ricki Rodriguez, MD; Location: [facility] ENDOSCOPY; Service: Cardiovascular; Laterality: N/A;   .  Cardioversion  01/17/2011     Procedure:  CARDIOVERSION; Surgeon: Ricki Rodriguez, MD; Location: [facility] ENDOSCOPY; Service: Cardiovascular; Laterality: N/A;   .  Cardiac catheterization    .  Insert / replace / remove pacemaker    .  Anterior cervical decomp/discectomy fusion  08/21/2011     Procedure: ANTERIOR CERVICAL DECOMPRESSION/DISCECTOMY FUSION 2 LEVELS; Surgeon: Eldred Manges, MD; Location: [facility] OR; Service: Orthopedics; Laterality: N/A; C5-6, C6-7 Anterior Cervical Discectomy and Fusion, allograft, plate    Prior to Admission medications   Medication  Sig  Start Date  End Date  Taking?  Authorizing Provider   amiodarone (PACERONE) 200 MG tablet  Take 1 tablet (200 mg total) by mouth daily.  11/13/11    Verne Spurr, PA-C   colchicine 0.6 MG tablet  Take 1 tablet (0.6 mg total) by mouth 2 (two) times daily. For gout  11/13/11    Nanine Means, NP   digoxin (LANOXIN) 0.25 MG tablet  Take 1 tablet (0.25 mg total) by mouth daily.  11/13/11    Verne Spurr, PA-C   furosemide (LASIX) 80 MG tablet  Take 1 tablet (80 mg total) by mouth daily.  11/13/11    Verne Spurr, PA-C   hydrOXYzine (ATARAX/VISTARIL)  50 MG tablet  Take 1 tablet (50 mg total) by mouth at bedtime and may repeat dose one time if needed.  11/13/11    Verne Spurr, PA-C   isosorbide-hydrALAZINE (BIDIL) 20-37.5 MG per tablet  Take 1 tablet by mouth daily.  11/13/11    Verne Spurr, PA-C   metolazone (ZAROXOLYN) 5 MG tablet  Take 0.5 tablets (2.5 mg total) by mouth daily.  11/13/11    Verne Spurr, PA-C   metoprolol succinate (TOPROL-XL) 25 MG 24 hr tablet  Take 1 tablet (25 mg total) by mouth daily.  11/13/11    Verne Spurr, PA-C   pantoprazole (PROTONIX) 40 MG tablet  Take 1 tablet (40 mg total) by mouth daily at 12 noon.  11/13/11    Verne Spurr, PA-C   potassium chloride SA (KLOR-CON M20) 20 MEQ tablet  Take 1 tablet (20 mEq total) by mouth daily.  11/13/11    Verne Spurr, PA-C   rosuvastatin (CRESTOR) 5 MG tablet  Take 1 tablet (5 mg total) by mouth at bedtime.   11/13/11    Verne Spurr, PA-C   spironolactone (ALDACTONE) 25 MG tablet  Take 1 tablet (25 mg total) by mouth daily.  11/13/11    Verne Spurr, PA-C   traMADol (ULTRAM) 50 MG tablet  Take 1 tablet (50 mg total) by mouth every 6 (six) hours as needed for pain.  11/13/11    Verne Spurr, PA-C   warfarin (COUMADIN) 10 MG tablet  Take 1 tablet (10 mg total) by mouth every evening.  11/13/11    Verne Spurr, PA-C   Social History:  reports that he has never smoked. He has never used smokeless tobacco. He reports that he uses illicit drugs ("Crack" cocaine and Marijuana). He reports that he does not drink alcohol.  Family History   Problem  Relation  Age of Onset   .  Heart disease     .  Heart failure     .  Stroke     .  Anesthesia problems  Neg Hx    .  Hypotension  Neg Hx    .  Malignant hyperthermia  Neg Hx    .  Pseudochol deficiency  Neg Hx     Review of Systems:  Constitutional: Denies fever, chills, diaphoresis, appetite change.  HEENT: Denies photophobia, eye pain, redness, hearing loss, ear pain, congestion, sore throat, rhinorrhea, sneezing, mouth sores, trouble swallowing, neck pain, neck stiffness and tinnitus.  Respiratory: Denies SOB, DOE, cough, and wheezing.  Cardiovascular: Denies palpitations and leg swelling.  Gastrointestinal: Denies abdominal pain, constipation, blood in stool and abdominal distention.  Genitourinary: Denies dysuria, urgency, frequency, hematuria, flank pain and difficulty urinating.  Musculoskeletal: Denies myalgias, back pain, joint swelling, arthralgias and gait problem.  Skin: Denies pallor, rash and wound.  Neurological: Denies dizziness, seizures, syncope, weakness, light-headedness, numbness and headaches.  Hematological: Denies adenopathy. Easy bruising, personal or family bleeding history  Psychiatric/Behavioral: Denies suicidal ideation, mood changes, confusion, nervousness, sleep disturbance and agitation  Physical Exam:  Blood pressure  96/61, pulse 46, temperature 97.4 F (36.3 C), temperature source Oral, resp. rate 20, height 6\' 3"  (1.905 m), weight 100.245 kg (221 lb), SpO2 99.00%.  Gen: AA Ox3, lying in bed. Feels very weak.  HEENT: [place]/AT/PERRL  Neck: supple, no JVD, no LAD, no bruits, no goiter.  CV: brady, regular, no M/R/G  Lungs: CTA B  Abd: S/NT/ND/+BS  Ext: no C/C/E +pedal pulses.  Neuro: grossly intact and non-focal.  Labs on  Admission:  Results for orders placed during the hospital encounter of 11/07/11 (from the past 48 hour(s))   PROTIME-INR Status: Abnormal    Collection Time    11/12/11 6:55 AM   Component  Value  Range  Comment    Prothrombin Time  29.3 (*)  11.6 - 15.2 seconds     INR  2.96 (*)  0.00 - 1.49    PROTIME-INR Status: Abnormal    Collection Time    11/13/11 6:45 AM   Component  Value  Range  Comment    Prothrombin Time  26.4 (*)  11.6 - 15.2 seconds     INR  2.58 (*)  0.00 - 1.49     Radiological Exams on Admission:  No results found.  Assessment/Plan  Principal Problem:  *Weakness generalized  Active Problems:  CARDIOMYOPATHY, DILATED  Chronic systolic CHF (congestive heart failure), NYHA class 2  Atrial fibrillation  Cocaine dependence  Substance induced mood disorder  Hypotension  Bradycardia  Diarrhea   Generalized Weakness  -Broad differential at this time as I have no lab tests or images.  -With his diarrhea and recent hospitalization feel important to r/o c diff.  -Will also consider other infectious causes: Check CXR, UA, blood cx.  -Could also be acute CHF: CXR and BNP ordered.  -Also check TSH to r/o hypothyroidism and B12 to r/o B12 deficiency.  -Given concurrent CP and cardiac history, will r/o ACS with serial troponins and EKG.  -Do not believe we need to consider CVA given the non-focality of his exam.  Hypotension  -Likely 2/2 diarrhea.  -Hold antihypertensive agents for now.  -Will gently rehydrate, given h/o EF of 20%, with NS @ 50 for 12 hours.    -Orthostatic VS have been ordered.  Chest Pain  -Serial troponins and EKG.  -Cardiology consult with Dr. [name] has been requested.  Bradycardia  -Will hold offending meds including digoxin, metoprolol, lasix, metolazone, spironolactone.  -May also be contributing to his weakness.  Atrial Fibrillation  -Continue coumadin as dosed by pharmacy.  DVT Prophylaxis  -Already fully anticoagulated on warfarin with therapeutic INR.  Time Spent on Admission:  80 minutes  HERNANDEZ ACOSTA,ESTELA  [facility]  Pager: [phone]  11/13/2011, 4:31 PM

## 2011-11-13 NOTE — Tx Team (Signed)
Interdisciplinary Treatment Plan Update (Adult)  Date:  11/13/2011  Time Reviewed:  11:48 AM   Progress in Treatment: Attending groups: Yes Participating in groups:  Yes Taking medication as prescribed: Yes Tolerating medication:  Yes Family/Significant othe contact made:  No, pt refused Patient understands diagnosis:  Yes Discussing patient identified problems/goals with staff:  Yes Medical problems stabilized or resolved:  Yes Denies suicidal/homicidal ideation: Yes Issues/concerns per patient self-inventory:  None identified Other: N/A  New problem(s) identified: None Identified  Reason for Continuation of Hospitalization: Medical Issues Medication stabilization  Interventions implemented related to continuation of hospitalization: mood stabilization, medication monitoring and adjustment, group therapy and psycho education, safety checks q 15 mins  Additional comments: N/A  Estimated length of stay: 1 day  Discharge Plan: Pt has follow up scheduled at [facility] for medication management and therapy.    New goal(s): N/A  Review of initial/current patient goals per problem list:    1.  Goal(s): Address substance use  Met:  Yes  Target date: by discharge  As evidenced by: completing detox protocol and refer to appropriate treatment  2.  Goal (s): Reduce depressive and anxiety symptoms  Met:  No  Target date: by discharge  As evidenced by: Reducing depression from a 10 to a 3 as reported by pt.    3.  Goal(s): Eliminate SI  Met:  Yes  Target date: by discharge  As evidenced by: Pt denying SI. Pt has denies SI for the past 48 hours.     Attendees: Patient:  [name]  11/13/2011 11:48 AM  Family:     Physician: Geoffery Lyons, MD 11/13/2011 11:48 AM   Nursing: Rosemarie Beath, RN 11/13/2011 11:48 AM   Clinical Social Worker:  Reyes Ivan, LCSWA 11/13/2011  11:48 AM   Other: Nanine Means, NP 11/13/2011  11:48 AM   Other:  Bubba Camp, Psyc intern  11/13/2011 11:49 AM   Other:     Other:     Other:      Scribe for Treatment Team:   Reyes Ivan 11/13/2011 11:48 AM

## 2011-11-13 NOTE — Progress Notes (Signed)
Patient ID: RONNIE DOO, male   DOB: 01/04/1950, 61 y.o.   MRN: [id number]  Patient refusing AM medications, stating "I just don't feel good". Molli Knock, NP notified. No new orders obtained.

## 2011-11-13 NOTE — Progress Notes (Signed)
[facility] MD Progress Note  11/13/2011 3:24 PM [name]  MRN:  [id number]  Diagnosis:  Cocaine Dependence, Substance Induced Mood Disorder  ADL's:  Intact  Sleep: Fair  Appetite:  Poor  Suicidal Ideation:  Plan:  Denies Intent:  Denies Means:  Denies Homicidal Ideation:  Plan:  Denies Intent:  Denies Means:  Denies  AEB (as evidenced by):  Mental Status Examination/Evaluation: Objective:  Appearance: Disheveled  Eye Contact::  Minimal  Speech:  Slow  Volume:  Decreased  Mood:  Depressed, Worried  Affect:  Restricted  Thought Process:  Coherent and Goal Directed  Orientation:  Full  Thought Content:  Does not feel well physically wise, Worried about his symptoms of ches pain  Suicidal Thoughts:  No  Homicidal Thoughts:  No  Memory:  Immediate;   Fair Recent;   Fair Remote;   Fair  Judgement:  Fair  Insight:  Present  Psychomotor Activity:  Decreased  Concentration:  Fair  Recall:  Fair  Akathisia:  No  Handed:  Right  AIMS (if indicated):     Assets:  Desire for Improvement  Sleep:  Number of Hours: 5.5    Vital Signs:Blood pressure 96/61, pulse 46, temperature 97.4 F (36.3 C), temperature source Oral, resp. rate 20, height 6\' 3"  (1.905 m), weight 100.245 kg (221 lb), SpO2 99.00%. Current Medications: Current Facility-Administered Medications  Medication Dose Route Frequency Provider Last Rate Last Dose  . acetaminophen (TYLENOL) tablet 650 mg  650 mg Oral Q6H PRN Kerry Hough, PA      . alum & mag hydroxide-simeth (MAALOX/MYLANTA) 200-200-20 MG/5ML suspension 30 mL  30 mL Oral Q4H PRN Kerry Hough, PA      . amiodarone (PACERONE) tablet 200 mg  200 mg Oral Daily Kerry Hough, PA   200 mg at 11/12/11 0819  . atorvastatin (LIPITOR) tablet 10 mg  10 mg Oral q1800 Kerry Hough, PA   10 mg at 11/12/11 1616  . chlordiazePOXIDE (LIBRIUM) capsule 25 mg  25 mg Oral QID PRN Kerry Hough, PA      . colchicine tablet 0.6 mg  0.6 mg Oral BID Jorje Guild, PA-C   0.6 mg at 11/13/11 0835  . digoxin (LANOXIN) tablet 0.25 mg  0.25 mg Oral Daily Kerry Hough, PA   0.25 mg at 11/12/11 0818  . furosemide (LASIX) tablet 80 mg  80 mg Oral Daily Kerry Hough, PA   80 mg at 11/12/11 0820  . isosorbide dinitrate (ISORDIL) tablet 20 mg  20 mg Oral Daily Rachael Fee, MD   20 mg at 11/12/11 4098   And  . hydrALAZINE (APRESOLINE) tablet 37.5 mg  37.5 mg Oral Daily Rachael Fee, MD   37.5 mg at 11/12/11 0819  . hydrOXYzine (ATARAX/VISTARIL) tablet 50 mg  50 mg Oral QHS,MR X 1 Kerry Hough, PA   50 mg at 11/12/11 2208  . ibuprofen (ADVIL,MOTRIN) tablet 800 mg  800 mg Oral Q6H PRN Nanine Means, NP      . magnesium hydroxide (MILK OF MAGNESIA) suspension 30 mL  30 mL Oral Daily PRN Kerry Hough, PA      . metolazone (ZAROXOLYN) tablet 2.5 mg  2.5 mg Oral Daily Kerry Hough, PA   2.5 mg at 11/12/11 0820  . metoprolol succinate (TOPROL-XL) 24 hr tablet 25 mg  25 mg Oral Daily Kerry Hough, PA   25 mg at 11/12/11 0819  . ondansetron (ZOFRAN) tablet  4 mg  4 mg Oral Q8H PRN Nanine Means, NP   4 mg at 11/12/11 1617  . pantoprazole (PROTONIX) EC tablet 40 mg  40 mg Oral Q1200 Kerry Hough, PA   40 mg at 11/12/11 1244  . potassium chloride SA (K-DUR,KLOR-CON) CR tablet 20 mEq  20 mEq Oral Daily Kerry Hough, PA   20 mEq at 11/13/11 0839  . sodium chloride (OCEAN) 0.65 % nasal spray 1 spray  1 spray Each Nare PRN Verne Spurr, PA-C      . spironolactone (ALDACTONE) tablet 25 mg  25 mg Oral Daily Kerry Hough, PA   25 mg at 11/12/11 0819  . traMADol (ULTRAM) tablet 50 mg  50 mg Oral Q6H PRN Rachael Fee, MD   50 mg at 11/12/11 1643  . [COMPLETED] warfarin (COUMADIN) tablet 7.5 mg  7.5 mg Oral ONCE-1800 Malva Cogan, RPH   7.5 mg at 11/12/11 1616  . Warfarin - Pharmacist Dosing Inpatient   Does not apply q1800 Rachael Fee, MD        Lab Results:  Results for orders placed during the hospital encounter of 11/07/11 (from the past 48  hour(s))  PROTIME-INR     Status: Abnormal   Collection Time   11/12/11  6:55 AM      Component Value Range Comment   Prothrombin Time 29.3 (*) 11.6 - 15.2 seconds    INR 2.96 (*) 0.00 - 1.49   PROTIME-INR     Status: Abnormal   Collection Time   11/13/11  6:45 AM      Component Value Range Comment   Prothrombin Time 26.4 (*) 11.6 - 15.2 seconds    INR 2.58 (*) 0.00 - 1.49     Physical Findings: AIMS: Facial and Oral Movements Muscles of Facial Expression: None, normal Lips and Perioral Area: None, normal Jaw: None, normal Tongue: None, normal,Extremity Movements Upper (arms, wrists, hands, fingers): None, normal Lower (legs, knees, ankles, toes): None, normal, Trunk Movements Neck, shoulders, hips: None, normal, Overall Severity Severity of abnormal movements (highest score from questions above): None, normal Incapacitation due to abnormal movements: None, normal Patient's awareness of abnormal movements (rate only patient's report): No Awareness, Dental Status Current problems with teeth and/or dentures?: No Does patient usually wear dentures?: No  CIWA:    COWS:  COWS Total Score: 1   Treatment Plan Summary: Daily contact with patient to assess and evaluate symptoms and progress in treatment Medication management  Plan: Supportive approach/coping skills           Internal Medicine counsultation  Alix Lahmann A 11/13/2011, 3:24 PM

## 2011-11-13 NOTE — Progress Notes (Signed)
Patient ID: [name], male   DOB: 11/13/1950, 61 y.o.   MRN: [id number]  EKG performed at bedside as ordered, done by this RN.

## 2011-11-13 NOTE — Social Work (Signed)
Aftercare Planning Group: 11/13/2011 9:45 AM  Pt did not attend d/c planning group on this date.  CSW met with pt individually at this time.  Pt states that he is concerned about his medical issues.  Pt is scheduled to follow up at [facility] for medication management and therapy.  No further needs voiced by pt at this time.    [facility] Group Note : Clinical Social Worker Group Therapy  11/13/2011  1:15 PM  Type of Therapy:  Group Therapy  Participation Level:  Did Not Attend group on overcoming obstacles   Monda Chastain Horton, LCSWA 11/13/2011 3:00 pm

## 2011-11-14 DIAGNOSIS — N182 Chronic kidney disease, stage 2 (mild): Secondary | ICD-10-CM

## 2011-11-14 DIAGNOSIS — N179 Acute kidney failure, unspecified: Secondary | ICD-10-CM

## 2011-11-14 DIAGNOSIS — R5383 Other fatigue: Secondary | ICD-10-CM

## 2011-11-14 DIAGNOSIS — R072 Precordial pain: Secondary | ICD-10-CM

## 2011-11-14 DIAGNOSIS — I4891 Unspecified atrial fibrillation: Secondary | ICD-10-CM

## 2011-11-14 DIAGNOSIS — I498 Other specified cardiac arrhythmias: Secondary | ICD-10-CM

## 2011-11-14 LAB — BASIC METABOLIC PANEL
BUN: 50 mg/dL — ABNORMAL HIGH (ref 6–23)
Chloride: 101 mEq/L (ref 96–112)
GFR calc Af Amer: 32 mL/min — ABNORMAL LOW (ref >90)
GFR calc non Af Amer: 28 mL/min — ABNORMAL LOW (ref >90)
Glucose, Bld: 97 mg/dL (ref 70–99)
Potassium: 4.7 mEq/L (ref 3.5–5.1)
Sodium: 137 mEq/L (ref 135–145)

## 2011-11-14 LAB — URINE MICROSCOPIC-ADD ON

## 2011-11-14 LAB — CBC
HCT: 45.7 % (ref 39.0–52.0)
Hemoglobin: 15.8 g/dL (ref 13.0–17.0)
MCHC: 34.6 g/dL (ref 30.0–36.0)
RBC: 5.25 MIL/uL (ref 4.22–5.81)

## 2011-11-14 LAB — VITAMIN B12: Vitamin B-12: 723 pg/mL (ref 211–911)

## 2011-11-14 LAB — URINALYSIS, ROUTINE W REFLEX MICROSCOPIC
Bilirubin Urine: NEGATIVE
Hgb urine dipstick: NEGATIVE
Specific Gravity, Urine: 1.022 (ref 1.005–1.030)
Urobilinogen, UA: 1 mg/dL (ref 0.0–1.0)
pH: 6 (ref 5.0–8.0)

## 2011-11-14 LAB — PROTIME-INR: Prothrombin Time: 27.5 seconds — ABNORMAL HIGH (ref 11.6–15.2)

## 2011-11-14 LAB — TSH: TSH: 2.67 u[IU]/mL (ref 0.350–4.500)

## 2011-11-14 MED ORDER — ZOLPIDEM TARTRATE 5 MG PO TABS
5.0000 mg | ORAL_TABLET | Freq: Every evening | ORAL | Status: DC | PRN
  Administered 2011-11-14: 5 mg via ORAL
  Filled 2011-11-14: qty 1

## 2011-11-14 MED ORDER — SODIUM CHLORIDE 0.9 % IV SOLN
INTRAVENOUS | Status: AC
  Administered 2011-11-14: 22:00:00 via INTRAVENOUS

## 2011-11-14 MED ORDER — WARFARIN SODIUM 7.5 MG PO TABS
7.5000 mg | ORAL_TABLET | Freq: Once | ORAL | Status: AC
  Administered 2011-11-14: 7.5 mg via ORAL
  Filled 2011-11-14: qty 1

## 2011-11-14 NOTE — Consult Note (Signed)
Reason for Consult: Chest pain hypotension and bradycardia Referring Physician: [facility] hospitalist  [name] is an 61 y.o. male.  HPI: Patient is 61 year old male with past medical history significant for multiple medical problems i.e. 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 Patient states he is under a lot of stress at home because of her sister and started doing cocaine and finally got himself admitted at behavioral health. Yesterday patient her complaint of generalized weakness and vague chest pain and was noted to be hypotensive with marked bradycardia and was transferred to [facility]. Patient states he has been having nausea vomiting and diarrhea for last 2 days. Patient also complained of vague retrosternal chest pain off and on. Denies any exertional chest pain. Denies palpitation lightheadedness or syncopal episode recently. Patient states his diarrhea has stopped and is feeling better now  Past Medical History  Diagnosis Date  . CHF (congestive heart failure)   . Sarcoidosis   . Cardiomyopathy, dilated, nonischemic     non ischemic by cath  . Acute on chronic systolic heart failure   . Automatic implantable cardiac defibrillator in situ   . Atrial fibrillation   . NSVT (nonsustained ventricular tachycardia)   . GERD (gastroesophageal reflux disease)   . Hypercholesteremia   . Myocardial infarction   . Shortness of breath   . Chronic kidney disease (CKD), stage III (moderate)   . Pacemaker   . Anginal pain   . Gout   . Hypertension     [name]  . Coronary artery disease     Past Surgical History  Procedure Date    . Back surgery 1987    Ruptured disk repair  . Pacemaker insertion     with ICD  . Tee without cardioversion 01/17/2011    Procedure: TRANSESOPHAGEAL ECHOCARDIOGRAM (TEE);  Surgeon: Ricki Rodriguez, MD;  Location: [facility] ENDOSCOPY;  Service: Cardiovascular;  Laterality: N/A;  . Cardioversion 01/17/2011    Procedure: CARDIOVERSION;  Surgeon: Ricki Rodriguez, MD;  Location: [facility] ENDOSCOPY;  Service: Cardiovascular;  Laterality: N/A;  . Cardiac catheterization   . Insert / replace / remove pacemaker   . Anterior cervical decomp/discectomy fusion 08/21/2011    Procedure: ANTERIOR CERVICAL DECOMPRESSION/DISCECTOMY FUSION 2 LEVELS;  Surgeon: Eldred Manges, MD;  Location: [facility] OR;  Service: Orthopedics;  Laterality: N/A;  C5-6, C6-7 Anterior Cervical Discectomy and Fusion, allograft, plate    Family History  Problem Relation Age of Onset  . Heart disease    . Heart failure    . Stroke    . Anesthesia problems Neg Hx   . Hypotension Neg Hx   . Malignant hyperthermia Neg Hx   . Pseudochol deficiency Neg Hx     Social History:  reports that he has never smoked. He has never used smokeless tobacco. He reports that he uses illicit drugs ("Crack" cocaine and Marijuana). He reports that he does not drink alcohol.  Allergies: No Known Allergies  Medications: I have reviewed the patient's current medications.  Results for orders placed during the hospital encounter of 11/13/11 (from the past 48 hour(s))  COMPREHENSIVE METABOLIC PANEL     Status: Abnormal   Collection Time   11/13/11  7:14 PM  Component Value Range Comment   Sodium 133 (*) 135 - 145 mEq/L    Potassium 5.8 (*) 3.5 - 5.1 mEq/L    Chloride 97  96 - 112 mEq/L    CO2 27  19 - 32 mEq/L    Glucose, Bld 88  70 - 99 mg/dL    BUN 48 (*) 6 - 23 mg/dL    Creatinine, Ser 4.09 (*) 0.50 - 1.35 mg/dL    Calcium 81.1  8.4 - 10.5 mg/dL    Total Protein 8.4 (*) 6.0 - 8.3 g/dL    Albumin 3.4 (*) 3.5 - 5.2 g/dL    AST 77 (*) 0 - 37 U/L    ALT 49  0  - 53 U/L    Alkaline Phosphatase 76  39 - 117 U/L    Total Bilirubin 0.4  0.3 - 1.2 mg/dL    GFR calc non Af Amer 26 (*) >90 mL/min    GFR calc Af Amer 30 (*) >90 mL/min   CBC     Status: Normal   Collection Time   11/13/11  7:14 PM      Component Value Range Comment   WBC 4.2  4.0 - 10.5 K/uL    RBC 5.21  4.22 - 5.81 MIL/uL    Hemoglobin 15.6  13.0 - 17.0 g/dL    HCT 91.4  78.2 - 95.6 %    MCV 86.8  78.0 - 100.0 fL    MCH 29.9  26.0 - 34.0 pg    MCHC 34.5  30.0 - 36.0 g/dL    RDW 21.3  08.6 - 57.8 %    Platelets 266  150 - 400 K/uL   TSH     Status: Normal   Collection Time   11/13/11  7:14 PM      Component Value Range Comment   TSH 2.670  0.350 - 4.500 uIU/mL   PRO B NATRIURETIC PEPTIDE     Status: Abnormal   Collection Time   11/13/11  7:14 PM      Component Value Range Comment   Pro B Natriuretic peptide (BNP) 511.7 (*) 0 - 125 pg/mL   TROPONIN I     Status: Normal   Collection Time   11/13/11  7:14 PM      Component Value Range Comment   Troponin I <0.30  <0.30 ng/mL   HEMOGLOBIN A1C     Status: Abnormal   Collection Time   11/13/11  7:14 PM      Component Value Range Comment   Hemoglobin A1C 6.3 (*) <5.7 %    Mean Plasma Glucose 134 (*) <117 mg/dL   VITAMIN I69     Status: Normal   Collection Time   11/13/11  7:14 PM      Component Value Range Comment   Vitamin B-12 723  211 - 911 pg/mL   TROPONIN I     Status: Normal   Collection Time   11/14/11 12:47 AM      Component Value Range Comment   Troponin I <0.30  <0.30 ng/mL   TROPONIN I     Status: Normal   Collection Time   11/14/11  6:42 AM      Component Value Range Comment   Troponin I <0.30  <0.30 ng/mL   BASIC METABOLIC PANEL     Status: Abnormal   Collection Time   11/14/11  6:42 AM      Component Value Range Comment   Sodium 137  135 - 145 mEq/L    Potassium 4.7  3.5 - 5.1 mEq/L    Chloride 101  96 - 112 mEq/L    CO2 27  19 - 32 mEq/L    Glucose, Bld 97  70 - 99 mg/dL    BUN 50 (*) 6 - 23  mg/dL    Creatinine, Ser 4.09 (*) 0.50 - 1.35 mg/dL    Calcium 81.1  8.4 - 10.5 mg/dL    GFR calc non Af Amer 28 (*) >90 mL/min    GFR calc Af Amer 32 (*) >90 mL/min   CBC     Status: Normal   Collection Time   11/14/11  6:42 AM      Component Value Range Comment   WBC 5.2  4.0 - 10.5 K/uL    RBC 5.25  4.22 - 5.81 MIL/uL    Hemoglobin 15.8  13.0 - 17.0 g/dL    HCT 91.4  78.2 - 95.6 %    MCV 87.0  78.0 - 100.0 fL    MCH 30.1  26.0 - 34.0 pg    MCHC 34.6  30.0 - 36.0 g/dL    RDW 21.3  08.6 - 57.8 %    Platelets 251  150 - 400 K/uL   PROTIME-INR     Status: Abnormal   Collection Time   11/14/11  6:42 AM      Component Value Range Comment   Prothrombin Time 27.5 (*) 11.6 - 15.2 seconds    INR 2.72 (*) 0.00 - 1.49   URINALYSIS, ROUTINE W REFLEX MICROSCOPIC     Status: Abnormal   Collection Time   11/14/11 10:14 AM      Component Value Range Comment   Color, Urine YELLOW  YELLOW    APPearance CLEAR  CLEAR    Specific Gravity, Urine 1.022  1.005 - 1.030    pH 6.0  5.0 - 8.0    Glucose, UA NEGATIVE  NEGATIVE mg/dL    Hgb urine dipstick NEGATIVE  NEGATIVE    Bilirubin Urine NEGATIVE  NEGATIVE    Ketones, ur NEGATIVE  NEGATIVE mg/dL    Protein, ur 469 (*) NEGATIVE mg/dL    Urobilinogen, UA 1.0  0.0 - 1.0 mg/dL    Nitrite NEGATIVE  NEGATIVE    Leukocytes, UA NEGATIVE  NEGATIVE   URINE MICROSCOPIC-ADD ON     Status: Normal   Collection Time   11/14/11 10:14 AM      Component Value Range Comment   Squamous Epithelial / LPF RARE  RARE    WBC, UA 0-2  <3 WBC/hpf    RBC / HPF 0-2  <3 RBC/hpf     Portable Chest 1 View  11/13/2011  *RADIOLOGY REPORT*  Clinical Data: Chest pain and shortness of breath.  PORTABLE CHEST - 1 VIEW  Comparison: 10/07/2011  Findings: Pacer / AICD device with lead right ventricle.  Lower cervical spine fixation. Numerous leads and wires project over the chest.  Midline trachea.  Normal heart size.  No pleural effusion or pneumothorax.  No congestive failure.   Minimal biapical pleural thickening. Clear lungs.  IMPRESSION: No acute cardiopulmonary disease.   Original Report Authenticated By: Jeronimo Greaves, M.D.     Review of Systems  Constitutional: Negative for fever and chills.  HENT: Negative for hearing loss.   Eyes: Negative for blurred vision and double vision.  Respiratory: Negative for cough, hemoptysis and sputum production.   Cardiovascular: Positive for chest pain. Negative for palpitations, orthopnea,  claudication, leg swelling and PND.  Gastrointestinal: Positive for nausea, vomiting and diarrhea.  Genitourinary: Negative for dysuria.  Neurological: Negative for dizziness and headaches.   Blood pressure 104/73, pulse 65, temperature 98 F (36.7 C), temperature source Oral, resp. rate 19, height 6\' 3"  (1.905 m), weight 96.6 kg (212 lb 15.4 oz), SpO2 97.00%. Physical Exam  Constitutional: He is oriented to person, place, and time. He appears well-developed and well-nourished.  HENT:  Head: Normocephalic and atraumatic.  Nose: Nose normal.  Mouth/Throat: No oropharyngeal exudate.  Eyes: Conjunctivae normal and EOM are normal. Pupils are equal, round, and reactive to light. Left eye exhibits discharge. No scleral icterus.  Neck: Normal range of motion. Neck supple. No JVD present. No tracheal deviation present. No thyromegaly present.  Cardiovascular:       Irregularly irregular S1 and S2 soft there is soft systolic murmur no S3 or S4 gallop  Respiratory: Effort normal and breath sounds normal. No respiratory distress. He has no wheezes. He has no rales.  GI: Soft. Bowel sounds are normal. He exhibits no distension. There is no tenderness. There is no rebound.  Musculoskeletal: He exhibits no edema and no tenderness.  Neurological: He is alert and oriented to person, place, and time.    Assessment/Plan: Status post atypical chest pain MI ruled out Status post hypotensive shock secondary to meds and diarrhea Compensated systolic  heart failure Nonischemic cardiomyopathy status post ICD Hypertension Chronic A. fib History of nonsustained VT Chronic kidney disease stage IV GERD History of sarcoidosis History of gouty arthritis History of syncope in the past Degenerative joint disease Cocaine abuse Plan Agree with present management Restart home medicines has BP and heart rate tolerates Drug cessation consult No further cardiac workup needed at this point  [facility] N 11/14/2011, 12:38 PM

## 2011-11-14 NOTE — Progress Notes (Signed)
ANTICOAGULATION CONSULT NOTE - Initial Consult  Pharmacy Consult for Coumadin Indication: atrial fibrillation  No Known Allergies  Patient Measurements: Height: 6\' 3"  (190.5 cm) Weight: 212 lb 15.4 oz (96.6 kg) IBW/kg (Calculated) : 84.5   Labs:  Basename 11/14/11 0642 11/14/11 0047 11/13/11 1914 11/13/11 0645 11/12/11 0655  HGB 15.8 -- 15.6 -- --  HCT 45.7 -- 45.2 -- --  PLT 251 -- 266 -- --  APTT -- -- -- -- --  LABPROT 27.5* -- -- 26.4* 29.3*  INR 2.72* -- -- 2.58* 2.96*  HEPARINUNFRC -- -- -- -- --  CREATININE -- -- 2.53* -- --  CKTOTAL -- -- -- -- --  CKMB -- -- -- -- --  TROPONINI <0.30 <0.30 <0.30 -- --    Estimated Creatinine Clearance: 36.6 ml/min (by C-G formula based on Cr of 2.53).  Medical History: Past Medical History  Diagnosis Date  . CHF (congestive heart failure)   . Sarcoidosis   . Cardiomyopathy, dilated, nonischemic     non ischemic by cath  . Acute on chronic systolic heart failure   . Automatic implantable cardiac defibrillator in situ   . Atrial fibrillation   . NSVT (nonsustained ventricular tachycardia)   . GERD (gastroesophageal reflux disease)   . Hypercholesteremia   . Myocardial infarction   . Shortness of breath   . Chronic kidney disease (CKD), stage III (moderate)   . Pacemaker   . Anginal pain   . Gout   . Hypertension     [name]  . Coronary artery disease    Assessment:  61 yom on Coumadin PTA for h/o atrial fibrillation.  Pt's Coumadin was managed at [facility] by pharmacy and pt transferred to [facility] 11/11 secondary to weakness, hypotensive, bradycardia and diarrhea.    Pt reportedly takes Coumadin 10mg /day PTA (also on Amiodarone PTA).   INR was trending up on 10mg  daily and dose held 11/9 and Coumadin resumed 11/10 on a lower dose of 7.5mg .    INR up slightly this AM after 7.5mg  last night   No Bleeding reported, CBC wnl    Goal of Therapy:  INR 2-3    Plan:   Coumadin 7.5mg  po x 1 tonight  Daily  PT/INR  Pharmacy will f/u  Gwen Her PharmD  [phone] 11/14/2011 7:44 AM

## 2011-11-14 NOTE — Care Management Note (Unsigned)
    Page 1 of 1   11/16/2011     8:48:48 AM   CARE MANAGEMENT NOTE 11/16/2011  Patient:  [name], [name]   Account Number:  [id number]  Date Initiated:  11/14/2011  Documentation initiated by:  Lanier Clam  Subjective/Objective Assessment:   ADMITTED W/WEAKNESS, Delene Loll.AV:[id number],AOZ.COCAINE DEPENDANCE;INDUCED MOOD D/O.     Action/Plan:   FROM HOME   Anticipated DC Date:  11/16/2011   Anticipated DC Plan:  HOME/SELF CARE      DC Planning Services  CM consult      Choice offered to / List presented to:             Status of service:  In process, will continue to follow Medicare Important Message given?   (If response is "NO", the following Medicare IM given date fields will be blank) Date Medicare IM given:   Date Additional Medicare IM given:    Discharge Disposition:    Per UR Regulation:  Reviewed for med. necessity/level of care/duration of stay  If discussed at Long Length of Stay Meetings, dates discussed:    Comments:  11/16/11 Lealand Elting RN,BSN NCM [phone] NOTED 6 BEATS V TACH THIS AM.PSYCH-OTPT F/U.D/C PLAN HOME.  11/15/11 Javious Hallisey RN,BSN NCM [phone] PSYCH.CREAT ELEVATED.LABS.  11/14/11 Nicolaus Andel RN,BSN NCM [phone] PSYCH CONS-MED ADJUSTMENT.

## 2011-11-14 NOTE — Progress Notes (Signed)
[facility]             Progress Note   Subjective: Still feels weak. Has had no further emesis or diarrhea. C/o mild CP.  Objective: Vital signs in last 24 hours: Temp:  [98 F (36.7 C)-98.6 F (37 C)] 98 F (36.7 C) (11/12 0451) Pulse Rate:  [42-65] 65  (11/12 0451) Resp:  [18-20] 19  (11/11 2140) BP: (92-125)/(64-86) 104/73 mmHg (11/12 0451) SpO2:  [93 %-98 %] 97 % (11/12 0451) Weight:  [96.6 kg (212 lb 15.4 oz)] 96.6 kg (212 lb 15.4 oz) (11/11 1715) Weight change:  Last BM Date: 11/13/11  Intake/Output from previous day: 11/11 0701 - 11/12 0700 In: 271.7 [P.O.:240; I.V.:31.7] Out: -  Total I/O In: 120 [P.O.:120] Out: 375 [Urine:375]   Physical Exam: General: Alert, awake, oriented x3, in no acute distress. HEENT: No bruits, no goiter. Heart: irregular shythm, bradycardic. Lungs: Clear to auscultation bilaterally. Abdomen: Soft, nontender, nondistended, positive bowel sounds. Extremities: No clubbing cyanosis or edema with positive pedal pulses. Neuro: Grossly intact, nonfocal.    Lab Results: Basic Metabolic Panel:  [facility] 11/14/11 0642 11/13/11 1914  NA 137 133*  K 4.7 5.8*  CL 101 97  CO2 27 27  GLUCOSE 97 88  BUN 50* 48*  CREATININE 2.37* 2.53*  CALCIUM 10.0 10.1  MG -- --  PHOS -- --   Liver Function Tests:  [facility] 11/13/11 1914  AST 77*  ALT 49  ALKPHOS 76  BILITOT 0.4  PROT 8.4*  ALBUMIN 3.4*   CBC:  Basename 11/14/11 0642 11/13/11 1914  WBC 5.2 4.2  NEUTROABS -- --  HGB 15.8 15.6  HCT 45.7 45.2  MCV 87.0 86.8  PLT 251 266   Cardiac Enzymes:  Basename 11/14/11 0642 11/14/11 0047 11/13/11 1914  CKTOTAL -- -- --  CKMB -- -- --  CKMBINDEX -- -- --  TROPONINI <0.30 <0.30 <0.30   BNP:  Basename 11/13/11 1914  PROBNP 511.7*   Hemoglobin A1C:  Basename 11/13/11 1914  HGBA1C 6.3*   Thyroid Function Tests:  Basename 11/13/11 1914  TSH 2.670  T4TOTAL --  FREET4 --  T3FREE --  THYROIDAB --    Anemia Panel:  Basename 11/13/11 1914  VITAMINB12 723  FOLATE --  FERRITIN --  TIBC --  IRON --  RETICCTPCT --   Coagulation:  Basename 11/14/11 0642 11/13/11 0645  LABPROT 27.5* 26.4*  INR 2.72* 2.58*   Urine Drug Screen: Drugs of Abuse     Component Value Date/Time   LABOPIA NONE DETECTED 11/06/2011 2255   COCAINSCRNUR POSITIVE* 11/06/2011 2255   LABBENZ NONE DETECTED 11/06/2011 2255   AMPHETMU NONE DETECTED 11/06/2011 2255   THCU NONE DETECTED 11/06/2011 2255   LABBARB NONE DETECTED 11/06/2011 2255    Urinalysis:  Basename 11/14/11 1014  COLORURINE YELLOW  LABSPEC 1.022  PHURINE 6.0  GLUCOSEU NEGATIVE  HGBUR NEGATIVE  BILIRUBINUR NEGATIVE  KETONESUR NEGATIVE  PROTEINUR 100*  UROBILINOGEN 1.0  NITRITE NEGATIVE  LEUKOCYTESUR NEGATIVE    Studies/Results: Portable Chest 1 View  11/13/2011  *RADIOLOGY REPORT*  Clinical Data: Chest pain and shortness of breath.  PORTABLE CHEST - 1 VIEW  Comparison: 10/07/2011  Findings: Pacer / AICD device with lead right ventricle.  Lower cervical spine fixation. Numerous leads and wires project over the chest.  Midline trachea.  Normal heart size.  No pleural effusion or pneumothorax.  No congestive failure.  Minimal biapical pleural thickening. Clear lungs.  IMPRESSION: No acute cardiopulmonary disease.   Original  Report Authenticated By: Jeronimo Greaves, M.D.     Medications: Scheduled Meds:   . amiodarone  200 mg Oral Daily  . aspirin EC  81 mg Oral Daily  . atorvastatin  10 mg Oral q1800  . colchicine  0.6 mg Oral BID  . pantoprazole  40 mg Oral Q1200  . sodium chloride  3 mL Intravenous Q12H  . [COMPLETED] warfarin  7.5 mg Oral Once  . warfarin  7.5 mg Oral ONCE-1800  . Warfarin - Pharmacist Dosing Inpatient   Does not apply q1800   Continuous Infusions:   . [EXPIRED] sodium chloride 50 mL/hr at 11/13/11 2222   PRN Meds:.acetaminophen, acetaminophen, ondansetron (ZOFRAN) IV, ondansetron, senna-docusate,  traMADol  Assessment/Plan:  Principal Problem:  *Weakness generalized Active Problems:  CHRONIC KIDNEY DISEASE STAGE II (MILD)  Atrial fibrillation  Hypotension  Bradycardia  Diarrhea  ARF (acute renal failure)  Generalized Weakness  -Likely related to dehydration and ARF from n/v/d. -Will get PT/OT to assess.  Hypotension  -Likely 2/2 emesis/diarrhea.  -Hold antihypertensive agents for now.  -Will gently rehydrate, given h/o EF of 20%, with NS @ 50 for 12 hours.  -We can order more fluids if deemed necessary. -Once BP more stable, we can start adding back his meds.  ARF on CKD Stage II -Appears his baseline Cr is around 1.4. -2.5 on admission down to 2.3 today. -Acute factor likely related to prerenal azotemia from GI losses.  N/V/D -Resolved. -?Gastroenteritis. -C Diff PCR ordered but has not been able to produce a sample to send out.  Chest Pain  -Serial troponins and EKG -Dr. [name] has seen and no further work up is indicated at this point.  Atrial Fibrillation with Bradycardia  -Will hold offending meds including digoxin, metoprolol, lasix, metolazone, spironolactone.  -May also be contributing to his weakness.  -Continue coumadin as dosed by pharmacy.   Chronic Systolic CHF -Holding all meds given hypotension, bradycardia and ARF.  DVT Prophylaxis  -Already fully anticoagulated on warfarin with therapeutic INR.    Time spent coordinating care: 35 minutes.   LOS: 1 day   [facility] Pager: [phone] 11/14/2011, 1:35 PM

## 2011-11-15 DIAGNOSIS — I428 Other cardiomyopathies: Secondary | ICD-10-CM

## 2011-11-15 DIAGNOSIS — F1010 Alcohol abuse, uncomplicated: Secondary | ICD-10-CM

## 2011-11-15 LAB — CLOSTRIDIUM DIFFICILE BY PCR: Toxigenic C. Difficile by PCR: NEGATIVE

## 2011-11-15 LAB — BASIC METABOLIC PANEL
BUN: 42 mg/dL — ABNORMAL HIGH (ref 6–23)
CO2: 23 mEq/L (ref 19–32)
Calcium: 9.7 mg/dL (ref 8.4–10.5)
Creatinine, Ser: 2.01 mg/dL — ABNORMAL HIGH (ref 0.50–1.35)
GFR calc non Af Amer: 34 mL/min — ABNORMAL LOW (ref >90)
Glucose, Bld: 87 mg/dL (ref 70–99)

## 2011-11-15 MED ORDER — LORAZEPAM 0.5 MG PO TABS
0.5000 mg | ORAL_TABLET | Freq: Every evening | ORAL | Status: DC | PRN
  Administered 2011-11-15: 0.5 mg via ORAL
  Filled 2011-11-15: qty 1

## 2011-11-15 NOTE — Progress Notes (Signed)
[facility] PROGRESS NOTE  [name] ZOX:[id number] DOB: [birth year] DOA: 11/13/2011 PCP: Robynn Pane, MD  Assessment/Plan: Generalized Weakness  -Likely related to dehydration and ARF from n/v/d. -Will get PT/OT to assess.   Hypotension  -Likely 2/2 emesis/diarrhea.  -Hold antihypertensive agents for now.  -Will gently rehydrate, given h/o EF of 20%, with NS @ 50 for 12 hours.  -We can order more fluids if deemed necessary.  -Once BP more stable, we can start adding back his meds.   ARF on CKD Stage II  -Appears his baseline Cr is around 1.4.  -2.5 on admission down to 2.3 today.  -Acute factor likely related to prerenal azotemia from GI losses.   N/V/D  -Resolved.  -?Gastroenteritis.  -C Diff PCR negative  Chest Pain  -Serial troponins and EKG  -Dr. [name] has seen and no further work up is indicated at this point.   Atrial Fibrillation with Bradycardia  -Will hold offending meds including digoxin, metoprolol, lasix, metolazone, spironolactone.  -May also be contributing to his weakness.  -Continue coumadin as dosed by pharmacy.   Chronic Systolic CHF  -Holding all meds given hypotension, bradycardia and ARF.   Cocaine abuse- Asked psych social worker to see  DVT Prophylaxis  -Already fully anticoagulated on warfarin with therapeutic INR.    Code Status: full Family Communication: patient at bedside Disposition Plan: from [facility]   Consultants:  Psych- dispo?     HPI/Subjective: Feeling better, says he is going home tomm No CP, weak but better    Objective: Filed Vitals:   11/13/11 2156 11/14/11 0451 11/14/11 2136 11/15/11 0455  BP: 106/86 104/73 112/82 115/78  Pulse: 42 65 60 60  Temp:  98 F (36.7 C) 98.4 F (36.9 C) 98.2 F (36.8 C)  TempSrc:  Oral Oral Oral  Resp:   20 18  Height:      Weight:    99.61 kg (219 lb 9.6 oz)  SpO2: 97% 97% 99% 100%    Intake/Output Summary (Last 24 hours) at 11/15/11 0946 Last data filed  at 11/15/11 0600  Gross per 24 hour  Intake   2590 ml  Output   1775 ml  Net    815 ml   Filed Weights   11/13/11 1715 11/15/11 0455  Weight: 96.6 kg (212 lb 15.4 oz) 99.61 kg (219 lb 9.6 oz)    Exam:   General:  A+Ox3, NAD  Cardiovascular: rrr  Respiratory: clear anterior  Abdomen: +BS, soft, NT  Data Reviewed: Basic Metabolic Panel:  Lab 11/15/11 8119 11/14/11 0642 11/13/11 1914  NA 135 137 133*  K 4.6 4.7 5.8*  CL 102 101 97  CO2 23 27 27   GLUCOSE 87 97 88  BUN 42* 50* 48*  CREATININE 2.01* 2.37* 2.53*  CALCIUM 9.7 10.0 10.1  MG -- -- --  PHOS -- -- --   Liver Function Tests:  Lab 11/13/11 1914  AST 77*  ALT 49  ALKPHOS 76  BILITOT 0.4  PROT 8.4*  ALBUMIN 3.4*   No results found for this basename: LIPASE:5,AMYLASE:5 in the last 168 hours No results found for this basename: AMMONIA:5 in the last 168 hours CBC:  Lab 11/14/11 0642 11/13/11 1914  WBC 5.2 4.2  NEUTROABS -- --  HGB 15.8 15.6  HCT 45.7 45.2  MCV 87.0 86.8  PLT 251 266   Cardiac Enzymes:  Lab 11/14/11 0642 11/14/11 0047 11/13/11 1914  CKTOTAL -- -- --  CKMB -- -- --  CKMBINDEX -- -- --  TROPONINI <0.30 <0.30 <0.30   BNP (last 3 results)  Basename 11/13/11 1914 10/10/11 0515 10/07/11 2218  PROBNP 511.7* 9821.0* 3313.0*   CBG: No results found for this basename: GLUCAP:5 in the last 168 hours  Recent Results (from the past 240 hour(s))  CLOSTRIDIUM DIFFICILE BY PCR     Status: Normal   Collection Time   11/15/11  5:03 AM      Component Value Range Status Comment   C difficile by pcr NEGATIVE  NEGATIVE Final      Studies: Portable Chest 1 View  11/13/2011  *RADIOLOGY REPORT*  Clinical Data: Chest pain and shortness of breath.  PORTABLE CHEST - 1 VIEW  Comparison: 10/07/2011  Findings: Pacer / AICD device with lead right ventricle.  Lower cervical spine fixation. Numerous leads and wires project over the chest.  Midline trachea.  Normal heart size.  No pleural effusion or  pneumothorax.  No congestive failure.  Minimal biapical pleural thickening. Clear lungs.  IMPRESSION: No acute cardiopulmonary disease.   Original Report Authenticated By: Jeronimo Greaves, M.D.     Scheduled Meds:   . amiodarone  200 mg Oral Daily  . aspirin EC  81 mg Oral Daily  . atorvastatin  10 mg Oral q1800  . colchicine  0.6 mg Oral BID  . pantoprazole  40 mg Oral Q1200  . sodium chloride  3 mL Intravenous Q12H  . [COMPLETED] warfarin  7.5 mg Oral ONCE-1800  . Warfarin - Pharmacist Dosing Inpatient   Does not apply q1800   Continuous Infusions:   . [EXPIRED] sodium chloride 50 mL/hr at 11/14/11 2205    Principal Problem:  *Weakness generalized Active Problems:  CHRONIC KIDNEY DISEASE STAGE II (MILD)  Atrial fibrillation  Hypotension  Bradycardia  Diarrhea  ARF (acute renal failure)    Time spent: 35    [facility], [name]  [facility] Pager [phone]. If 8PM-8AM, please contact night-coverage at [email or web address], password [facility] 11/15/2011, 9:46 AM  LOS: 2 days

## 2011-11-15 NOTE — Progress Notes (Signed)
Pharmacist Heart Failure Core Measure Documentation   Patient has an EF documented as 20-25% onn 02/20/2011   by 2D ECHO   Rationale: Heart failure patients with left ventricular systolic dysfunction (LVSD) and an EF < 40% should be prescribed an angiotensin converting enzyme inhibitor (ACEI) or angiotensin receptor blocker (ARB) at discharge unless a contraindication is documented in the medical record.  This patient is not currently on an ACEI or ARB for HF.  This note is being placed in the record in order to provide documentation that a contraindication to the use of these agents is present for this encounter.   ACE Inhibitor or Angiotensin Receptor Blocker is contraindicated  (specify all that apply)  [ ]  ACEI allergy AND ARB allergy  [ ]  Angioedema  [ ]  Moderate or severe aortic stenosis  [ ]  Hyperkalemia  [ ]  Hypotension  [ ]  Renal artery stenosis  [x]  Worsening renal function, preexisting renal disease or dysfunction  Elie Goody, PharmD, BCPS Pager: [phone] 11/15/2011  11:21 AM

## 2011-11-15 NOTE — Progress Notes (Signed)
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    Pt sent from [facility] to [facility] for medical clearance.    Abuse/Neglect/Trauma Comments:   Psychiatric History (check all that apply)  Inpatient/hospitilization   Psychiatric medications:  none   Current Mental Health Hospitalizations/Previous Mental Health History:   Pt sent to [facility] from [facility] for medical clearance   Current provider:   Place and Date:   Current Medications:   see H&P   Previous Impatient Admission/Date/Reason:   Emotional Health / Current Symptoms    Suicide/Self Harm  None reported   Suicide attempt in the past:   Other harmful behavior:   Psychotic/Dissociative Symptoms  None reported   Other Psychotic/Dissociative Symptoms:    Attention/Behavioral Symptoms  Within Normal Limits   Other Attention / Behavioral Symptoms:    Cognitive Impairment  Within Normal Limits   Other Cognitive Impairment:    Mood and Adjustment  Euthymic    Stress, Anxiety, Trauma, Any Recent Loss/Stressor  Relationship   Anxiety (frequency):   Phobia (specify):   Compulsive behavior (specify):   Obsessive behavior (specify):   Other:   Pt reports a tumultuous relationship with his sister.  Pt and his sister live with their mom.   Substance Abuse/Use  Current substance use   SBIRT completed (please refer for detailed history):    Self-reported substance use:   Pt smoked crack on 11/4   Urinary Drug Screen Completed:  Y Alcohol level:    Environmental/Housing/Living Arrangement  With Family Member    With Biological Parent(s)   Who is in the home:   Mom, sister   Emergency contact:  mom   Financial  Medicare   Patient's Strengths and Goals (patient's own words):   Clinical Social Worker's Interpretive Summary:   Met with P t to discuss current admission and d/c plans.    Pt reports that he has a long, rocky relationship with his sister and that recent, negative interactions with her, combined with [employer]'s homecoming, contributed to his relapse.    Pt states that he is very disappointed in himself for relapsing.  He has since been communicating with his pastor and other members of his church who care about and support him.  Pt states that he has many non-using friends and intends to surround himself with these people, as well as various members of his church, including his pastor. Additionally, Pt intends to resume attending NA/AA meetings, as he found these beneficial in the past.    Pt denies current SI, HI, AVH, paranoia, delusions.    Pt was receptive to [address] for outpt services.  CSW provided Pt with information on [facility].    Pt doesn't feel that he needs to return to [facility] and would like to d/c home to his mom's house.    No further CSW needs identified.    CSW thanked Pt for his time.    Notified MD that psych CSW to sign off.    Psych MD signing off.   Disposition:  Psych Clinical Social Worker signing off  [place], [place] Clinical Social Work [phone]

## 2011-11-15 NOTE — Progress Notes (Signed)
OT Note:  Pt screened for OT.  He was independent with PT.  He has been staying with Mom.  She has a tub bench and grab bar in the shower. He feels he will be independent with adls and bathroom transfers.  [place], OTR/L [phone] 11/15/2011

## 2011-11-15 NOTE — Evaluation (Signed)
Physical Therapy Evaluation Patient Details Name: [name] MRN: [id number] DOB: Dec 06, 1950 Today's Date: 11/15/2011 Time: 4098-1191 PT Time Calculation (min): 13 min  PT Assessment / Plan / Recommendation Clinical Impression  no further PT needs;     PT Assessment  Patent does not need any further PT services    Follow Up Recommendations  No PT follow up    Does the patient have the potential to tolerate intense rehabilitation      Barriers to Discharge        Equipment Recommendations  None recommended by PT    Recommendations for Other Services     Frequency      Precautions / Restrictions Precautions Precautions: None   Pertinent Vitals/Pain      Mobility  Bed Mobility Bed Mobility: Supine to Sit Supine to Sit: 7: Independent Transfers Transfers: Sit to Stand;Stand to Sit Sit to Stand: 7: Independent Stand to Sit: 7: Independent Ambulation/Gait Ambulation/Gait Assistance: 7: Independent Ambulation Distance (Feet): 350 Feet Assistive device: None Gait Pattern: Within Functional Limits General Gait Details: pt with some CP and pressure while amb; RN aware; pt with PVCs, in afib  per monitor; RN present upon PT departure; pt in NAD; BP 114/84    Shoulder Instructions     Exercises     PT Diagnosis:    PT Problem List:   PT Treatment Interventions:     PT Goals    Visit Information  Last PT Received On: 11/15/11 Assistance Needed: +1    Subjective Data  Subjective: I haven't been in the hallway Patient Stated Goal: home   Prior Functioning  Home Living Lives With:  (mother and sister) Available Help at Discharge:  (pt states he and his sister have "issues" and don't speak) Type of Home: House Home Access: Stairs to enter [employer] of Steps: 0-back/4-side/6-front; uses all entrances Home Layout: One level Home Adaptive Equipment: None Additional Comments: pt mother has dementia Prior Function Level of Independence:  Independent Able to Take Stairs?: Yes Communication Communication: No difficulties    Cognition  Overall Cognitive Status: Appears within functional limits for tasks assessed/performed Arousal/Alertness: Awake/alert Orientation Level: Appears intact for tasks assessed Behavior During Session: [facility] for tasks performed    Extremity/Trunk Assessment Right Upper Extremity Assessment RUE ROM/Strength/Tone: [facility] for tasks assessed Left Upper Extremity Assessment LUE ROM/Strength/Tone: WFL for tasks assessed Right Lower Extremity Assessment RLE ROM/Strength/Tone: [facility] for tasks assessed Left Lower Extremity Assessment LLE ROM/Strength/Tone: WFL for tasks assessed   Balance    End of Session PT - End of Session Activity Tolerance: Patient tolerated treatment well Patient left: in chair;with call bell/phone within reach;with nursing in room Nurse Communication: Mobility status  GP Functional Assessment Tool Used: clinical judgement Functional Limitation: Mobility: Walking and moving around Mobility: Walking and Moving Around Current Status (Y7829): 0 percent impaired, limited or restricted Mobility: Walking and Moving Around Goal Status (F6213): 0 percent impaired, limited or restricted Mobility: Walking and Moving Around Discharge Status (Y8657): 0 percent impaired, limited or restricted   [facility] 11/15/2011, 10:44 AM

## 2011-11-15 NOTE — Progress Notes (Signed)
Pt reported increased chest pressure and SOB when up with PT. Also, multiple PVCs on monitor. SOB resolved with rest. V/S stable and MD notified. Will continue to monitor. [name]

## 2011-11-15 NOTE — Progress Notes (Signed)
Subjective:  Denies any chest pain or shortness of breath he did no further episodes of hypotension or bradycardia.  Objective:  Vital Signs in the last 24 hours: Temp:  [97.4 F (36.3 C)-98.4 F (36.9 C)] 97.4 F (36.3 C) (11/13 1415) Pulse Rate:  [60-70] 62  (11/13 1415) Resp:  [18-20] 18  (11/13 1415) BP: (112-116)/(78-86) 116/86 mmHg (11/13 1415) SpO2:  [99 %-100 %] 100 % (11/13 1415) Weight:  [99.61 kg (219 lb 9.6 oz)] 99.61 kg (219 lb 9.6 oz) (11/13 0455)  Intake/Output from previous day: 11/12 0701 - 11/13 0700 In: 2590 [P.O.:840; I.V.:1750] Out: 1775 [Urine:1775] Intake/Output from this shift: Total I/O In: 840 [P.O.:840] Out: 225 [Urine:225]  Physical Exam: Neck: no adenopathy, no carotid bruit, no JVD and supple, symmetrical, trachea midline Lungs: clear to auscultation bilaterally Heart: irregularly irregular rhythm, S1, S2 normal and Soft systolic murmur noted Abdomen: soft, non-tender; bowel sounds normal; no masses,  no organomegaly Extremities: extremities normal, atraumatic, no cyanosis or edema  Lab Results:  [facility] 11/14/11 0642 11/13/11 1914  WBC 5.2 4.2  HGB 15.8 15.6  PLT 251 266    Basename 11/15/11 0525 11/14/11 0642  NA 135 137  K 4.6 4.7  CL 102 101  CO2 23 27  GLUCOSE 87 97  BUN 42* 50*  CREATININE 2.01* 2.37*    Basename 11/14/11 0642 11/14/11 0047  TROPONINI <0.30 <0.30   Hepatic Function Panel  Basename 11/13/11 1914  PROT 8.4*  ALBUMIN 3.4*  AST 77*  ALT 49  ALKPHOS 76  BILITOT 0.4  BILIDIR --  IBILI --   No results found for this basename: CHOL in the last 72 hours No results found for this basename: PROTIME in the last 72 hours  Imaging: Imaging results have been reviewed and Portable Chest 1 View  11/13/2011  *RADIOLOGY REPORT*  Clinical Data: Chest pain and shortness of breath.  PORTABLE CHEST - 1 VIEW  Comparison: 10/07/2011  Findings: Pacer / AICD device with lead right ventricle.  Lower cervical spine  fixation. Numerous leads and wires project over the chest.  Midline trachea.  Normal heart size.  No pleural effusion or pneumothorax.  No congestive failure.  Minimal biapical pleural thickening. Clear lungs.  IMPRESSION: No acute cardiopulmonary disease.   Original Report Authenticated By: Jeronimo Greaves, M.D.     Cardiac Studies:  Assessment/Plan:  Status post atypical chest pain MI ruled out  Status post hypotensive shock secondary to meds and diarrhea  Compensated systolic heart failure  Nonischemic cardiomyopathy status post ICD  Hypertension  Chronic A. fib  History of nonsustained VT  Chronic kidney disease stage IV  GERD  History of sarcoidosis  History of gouty arthritis  History of syncope in the past  Degenerative joint disease  Cocaine abuse  Plan Continue present management I will sign off please call if needed Followup with me with PT INR in one week  LOS: 2 days    [name] 11/15/2011, 5:16 PM

## 2011-11-15 NOTE — Consult Note (Signed)
Reason for Consult:Possible transfer for [facility] for substance abuse and  Mood disorder Referring Physician: Dr. Rhunette Croft is an 61 y.o. male.  HPI: Patient was admitted with generalized weakness and vague chest pain. 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 He appreciate his treatment over there. He denied symptoms of depression, anxiety and SI/HI and psychosis at this time. He has male friend from church visiting him and has plans of atetnding church groups upon discharge.  MSE: He has fine mood and affect. He has normal speech and thought process. He has no self harm or harm to others. He has no evidence of psychosis.  Past Medical History  Diagnosis Date  . CHF (congestive heart failure)   . Sarcoidosis   . Cardiomyopathy, dilated, nonischemic     non ischemic by cath  . Acute on chronic systolic heart failure   . Automatic implantable cardiac defibrillator in situ   . Atrial fibrillation   . NSVT (nonsustained ventricular tachycardia)   . GERD (gastroesophageal reflux disease)   . Hypercholesteremia   . Myocardial infarction   . Shortness of breath   . Chronic kidney disease (CKD), stage III (moderate)   . Pacemaker   . Anginal pain   . Gout   . Hypertension     [name]  . Coronary artery disease     Past Surgical History  Procedure Date  . Back surgery 1987    Ruptured disk repair  . Pacemaker insertion     with ICD  . Tee without cardioversion 01/17/2011    Procedure: TRANSESOPHAGEAL ECHOCARDIOGRAM (TEE);  Surgeon: Ricki Rodriguez, MD;  Location: [facility] ENDOSCOPY;  Service: Cardiovascular;  Laterality: N/A;  . Cardioversion 01/17/2011    Procedure: CARDIOVERSION;  Surgeon: Ricki Rodriguez, MD;  Location: [facility] ENDOSCOPY;  Service: Cardiovascular;  Laterality: N/A;  . Cardiac catheterization   . Insert / replace / remove pacemaker   . Anterior cervical decomp/discectomy fusion 08/21/2011    Procedure:  ANTERIOR CERVICAL DECOMPRESSION/DISCECTOMY FUSION 2 LEVELS;  Surgeon: Eldred Manges, MD;  Location: [facility] OR;  Service: Orthopedics;  Laterality: N/A;  C5-6, C6-7 Anterior Cervical Discectomy and Fusion, allograft, plate    Family History  Problem Relation Age of Onset  . Heart disease    . Heart failure    . Stroke    . Anesthesia problems Neg Hx   . Hypotension Neg Hx   . Malignant hyperthermia Neg Hx   . Pseudochol deficiency Neg Hx     Social History:  reports that he has never smoked. He has never used smokeless tobacco. He reports that he uses illicit drugs ("Crack" cocaine and Marijuana). He reports that he does not drink alcohol.  Allergies: No Known Allergies  Medications: I have reviewed the patient's current medications.  Results for orders placed during the hospital encounter of 11/13/11 (from the past 48 hour(s))  COMPREHENSIVE METABOLIC PANEL     Status: Abnormal   Collection Time   11/13/11  7:14 PM      Component Value Range Comment   Sodium 133 (*) 135 - 145 mEq/L    Potassium 5.8 (*) 3.5 - 5.1 mEq/L    Chloride 97  96 - 112 mEq/L    CO2 27  19 - 32 mEq/L    Glucose, Bld 88  70 - 99 mg/dL    BUN 48 (*) 6 - 23 mg/dL    Creatinine, Ser 8.11 (*)  0.50 - 1.35 mg/dL    Calcium 16.1  8.4 - 10.5 mg/dL    Total Protein 8.4 (*) 6.0 - 8.3 g/dL    Albumin 3.4 (*) 3.5 - 5.2 g/dL    AST 77 (*) 0 - 37 U/L    ALT 49  0 - 53 U/L    Alkaline Phosphatase 76  39 - 117 U/L    Total Bilirubin 0.4  0.3 - 1.2 mg/dL    GFR calc non Af Amer 26 (*) >90 mL/min    GFR calc Af Amer 30 (*) >90 mL/min   CBC     Status: Normal   Collection Time   11/13/11  7:14 PM      Component Value Range Comment   WBC 4.2  4.0 - 10.5 K/uL    RBC 5.21  4.22 - 5.81 MIL/uL    Hemoglobin 15.6  13.0 - 17.0 g/dL    HCT 09.6  04.5 - 40.9 %    MCV 86.8  78.0 - 100.0 fL    MCH 29.9  26.0 - 34.0 pg    MCHC 34.5  30.0 - 36.0 g/dL    RDW 81.1  91.4 - 78.2 %    Platelets 266  150 - 400 K/uL   TSH     Status:  Normal   Collection Time   11/13/11  7:14 PM      Component Value Range Comment   TSH 2.670  0.350 - 4.500 uIU/mL   PRO B NATRIURETIC PEPTIDE     Status: Abnormal   Collection Time   11/13/11  7:14 PM      Component Value Range Comment   Pro B Natriuretic peptide (BNP) 511.7 (*) 0 - 125 pg/mL   TROPONIN I     Status: Normal   Collection Time   11/13/11  7:14 PM      Component Value Range Comment   Troponin I <0.30  <0.30 ng/mL   HEMOGLOBIN A1C     Status: Abnormal   Collection Time   11/13/11  7:14 PM      Component Value Range Comment   Hemoglobin A1C 6.3 (*) <5.7 %    Mean Plasma Glucose 134 (*) <117 mg/dL   VITAMIN N56     Status: Normal   Collection Time   11/13/11  7:14 PM      Component Value Range Comment   Vitamin B-12 723  211 - 911 pg/mL   TROPONIN I     Status: Normal   Collection Time   11/14/11 12:47 AM      Component Value Range Comment   Troponin I <0.30  <0.30 ng/mL   TROPONIN I     Status: Normal   Collection Time   11/14/11  6:42 AM      Component Value Range Comment   Troponin I <0.30  <0.30 ng/mL   BASIC METABOLIC PANEL     Status: Abnormal   Collection Time   11/14/11  6:42 AM      Component Value Range Comment   Sodium 137  135 - 145 mEq/L    Potassium 4.7  3.5 - 5.1 mEq/L    Chloride 101  96 - 112 mEq/L    CO2 27  19 - 32 mEq/L    Glucose, Bld 97  70 - 99 mg/dL    BUN 50 (*) 6 - 23 mg/dL    Creatinine, Ser 2.13 (*) 0.50 - 1.35 mg/dL    Calcium  10.0  8.4 - 10.5 mg/dL    GFR calc non Af Amer 28 (*) >90 mL/min    GFR calc Af Amer 32 (*) >90 mL/min   CBC     Status: Normal   Collection Time   11/14/11  6:42 AM      Component Value Range Comment   WBC 5.2  4.0 - 10.5 K/uL    RBC 5.25  4.22 - 5.81 MIL/uL    Hemoglobin 15.8  13.0 - 17.0 g/dL    HCT 16.1  09.6 - 04.5 %    MCV 87.0  78.0 - 100.0 fL    MCH 30.1  26.0 - 34.0 pg    MCHC 34.6  30.0 - 36.0 g/dL    RDW 40.9  81.1 - 91.4 %    Platelets 251  150 - 400 K/uL   PROTIME-INR     Status:  Abnormal   Collection Time   11/14/11  6:42 AM      Component Value Range Comment   Prothrombin Time 27.5 (*) 11.6 - 15.2 seconds    INR 2.72 (*) 0.00 - 1.49   URINALYSIS, ROUTINE W REFLEX MICROSCOPIC     Status: Abnormal   Collection Time   11/14/11 10:14 AM      Component Value Range Comment   Color, Urine YELLOW  YELLOW    APPearance CLEAR  CLEAR    Specific Gravity, Urine 1.022  1.005 - 1.030    pH 6.0  5.0 - 8.0    Glucose, UA NEGATIVE  NEGATIVE mg/dL    Hgb urine dipstick NEGATIVE  NEGATIVE    Bilirubin Urine NEGATIVE  NEGATIVE    Ketones, ur NEGATIVE  NEGATIVE mg/dL    Protein, ur 782 (*) NEGATIVE mg/dL    Urobilinogen, UA 1.0  0.0 - 1.0 mg/dL    Nitrite NEGATIVE  NEGATIVE    Leukocytes, UA NEGATIVE  NEGATIVE   URINE MICROSCOPIC-ADD ON     Status: Normal   Collection Time   11/14/11 10:14 AM      Component Value Range Comment   Squamous Epithelial / LPF RARE  RARE    WBC, UA 0-2  <3 WBC/hpf    RBC / HPF 0-2  <3 RBC/hpf   CLOSTRIDIUM DIFFICILE BY PCR     Status: Normal   Collection Time   11/15/11  5:03 AM      Component Value Range Comment   C difficile by pcr NEGATIVE  NEGATIVE   PROTIME-INR     Status: Abnormal   Collection Time   11/15/11  5:25 AM      Component Value Range Comment   Prothrombin Time 31.5 (*) 11.6 - 15.2 seconds    INR 3.27 (*) 0.00 - 1.49   BASIC METABOLIC PANEL     Status: Abnormal   Collection Time   11/15/11  5:25 AM      Component Value Range Comment   Sodium 135  135 - 145 mEq/L    Potassium 4.6  3.5 - 5.1 mEq/L    Chloride 102  96 - 112 mEq/L    CO2 23  19 - 32 mEq/L    Glucose, Bld 87  70 - 99 mg/dL    BUN 42 (*) 6 - 23 mg/dL    Creatinine, Ser 9.56 (*) 0.50 - 1.35 mg/dL    Calcium 9.7  8.4 - 21.3 mg/dL    GFR calc non Af Amer 34 (*) >90 mL/min    GFR  calc Af Amer 39 (*) >90 mL/min   MAGNESIUM     Status: Normal   Collection Time   11/15/11  5:40 AM      Component Value Range Comment   Magnesium 2.1  1.5 - 2.5 mg/dL      Portable Chest 1 View  11/13/2011  *RADIOLOGY REPORT*  Clinical Data: Chest pain and shortness of breath.  PORTABLE CHEST - 1 VIEW  Comparison: 10/07/2011  Findings: Pacer / AICD device with lead right ventricle.  Lower cervical spine fixation. Numerous leads and wires project over the chest.  Midline trachea.  Normal heart size.  No pleural effusion or pneumothorax.  No congestive failure.  Minimal biapical pleural thickening. Clear lungs.  IMPRESSION: No acute cardiopulmonary disease.   Original Report Authenticated By: Jeronimo Greaves, M.D.     Positive for anxiety, depression and illegal drug usage Blood pressure 116/86, pulse 62, temperature 97.4 F (36.3 C), temperature source Oral, resp. rate 18, height 6\' 3"  (1.905 m), weight 219 lb 9.6 oz (99.61 kg), SpO2 100.00%.   Assessment/Plan: Cocaine abuse vs dependence  Patient does not meet criteria for acute psychiatric hospitalization at this time. He will be referred to out patient psychiatric services at [facility] upon discharger from hospital. No medication is recommended. He has no further psychiatric needs and will sign off.     Shawanda Sievert,JANARDHAHA R. 11/15/2011, 2:47 PM

## 2011-11-15 NOTE — Progress Notes (Signed)
ANTICOAGULATION CONSULT NOTE  Pharmacy Consult for Warfarin Indication: atrial fibrillation  No Known Allergies  Patient Measurements: Height: 6\' 3"  (190.5 cm) Weight: 219 lb 9.6 oz (99.61 kg) IBW/kg (Calculated) : 84.5   Labs:  Basename 11/15/11 0525 11/14/11 0642 11/14/11 0047 11/13/11 1914 11/13/11 0645  HGB -- 15.8 -- 15.6 --  HCT -- 45.7 -- 45.2 --  PLT -- 251 -- 266 --  APTT -- -- -- -- --  LABPROT 31.5* 27.5* -- -- 26.4*  INR 3.27* 2.72* -- -- 2.58*  HEPARINUNFRC -- -- -- -- --  CREATININE 2.01* 2.37* -- 2.53* --  CKTOTAL -- -- -- -- --  CKMB -- -- -- -- --  TROPONINI -- <0.30 <0.30 <0.30 --    Estimated Creatinine Clearance: 46.1 ml/min (by C-G formula based on Cr of 2.01).  Medical History: Past Medical History  Diagnosis Date  . CHF (congestive heart failure)   . Sarcoidosis   . Cardiomyopathy, dilated, nonischemic     non ischemic by cath  . Acute on chronic systolic heart failure   . Automatic implantable cardiac defibrillator in situ   . Atrial fibrillation   . NSVT (nonsustained ventricular tachycardia)   . GERD (gastroesophageal reflux disease)   . Hypercholesteremia   . Myocardial infarction   . Shortness of breath   . Chronic kidney disease (CKD), stage III (moderate)   . Pacemaker   . Anginal pain   . Gout   . Hypertension     [name]  . Coronary artery disease    Warfarin doses administered 11/5 - 11/12: 20, 10, 10, 10, 0, 7.5, 7.5, 7.5 mg  Assessment:  61 yo M on warfarin PTA for h/o atrial fibrillation.  Warfarin dosing was managed at [facility] by pharmacy and pt transferred to [facility] 11/11 secondary to weakness, hypotensive, bradycardia and diarrhea.    Patient's usual warfarin dosage PTA reported as 10 mg daily. INR was trending up this dosage so was subsequently reduced - see above for doses administered since 11/5.  INR supratherapeutic today despite reduction in warfarin dosage.  Suspect due in part to reduced vitamin K intake /  absorption in setting of diarrhea and GI upset.  Concomitant amiodarone noted - can increase INR response to warfarin.   No bleeding reported.    Goal of Therapy:  INR 2-3    Plan:  1. No warfarin today 2. Daily PT/INR   Elie Goody, PharmD, BCPS Pager: [phone] 11/15/2011  9:05 AM

## 2011-11-16 DIAGNOSIS — I959 Hypotension, unspecified: Secondary | ICD-10-CM

## 2011-11-16 DIAGNOSIS — F1420 Cocaine dependence, uncomplicated: Secondary | ICD-10-CM

## 2011-11-16 DIAGNOSIS — Z95810 Presence of automatic (implantable) cardiac defibrillator: Secondary | ICD-10-CM

## 2011-11-16 MED ORDER — ASPIRIN 81 MG PO TBEC: 81.0000 mg | DELAYED_RELEASE_TABLET | Freq: Every day | ORAL | Status: DC

## 2011-11-16 MED ORDER — WARFARIN SODIUM 7.5 MG PO TABS: 7.5000 mg | ORAL_TABLET | Freq: Every day | ORAL | Status: DC

## 2011-11-16 NOTE — Discharge Summary (Signed)
Physician Discharge Summary  [name] ZOX:[id number] DOB: 10/25/1950 DOA: 11/13/2011  PCP: Robynn Pane, MD  Admit date: 11/13/2011 Discharge date: 11/16/2011  Time spent: 35 minutes  Recommendations for Outpatient Follow-up:  1. PT/INR next week with Dr. [name]  Discharge Diagnoses:  Principal Problem:  *Weakness generalized Active Problems:  CHRONIC KIDNEY DISEASE STAGE II (MILD)  Atrial fibrillation  Hypotension  Bradycardia  Diarrhea  ARF (acute renal failure)   Discharge Condition: improved  Diet recommendation: cardiac  Filed Weights   11/13/11 1715 11/15/11 0455 11/16/11 0451  Weight: 96.6 kg (212 lb 15.4 oz) 99.61 kg (219 lb 9.6 oz) 99.882 kg (220 lb 3.2 oz)    History of present illness:  61 y/o man with PMH significant for dilated cardiomyopathy with EF of 20-25%, a fib anticoagulated on coumadin, CKD Stage III, was discharged from the hospital 10/10 by Dr. [name] for CP. He was voluntarily admitted to [facility] on 11/6 for a cocaine relapse. Today he complained of feeling very weak and not even able to get OOB. Has been hypotensive with SBP in the 90s, bradycardic to the 40s. Has been having diarrhea since Saturday (2 days). Also c/o SSCP, non-radiating, not related to exertion. We were asked to see him in consultation. However, I believe the scope of his medical issues require that he be transferred to [facility] hospital. His PCP and cardiologist, Dr. [name], has been contacted and he will see the patient in consultation.    Hospital Course:  Generalized Weakness  -Likely related to dehydration and ARF from n/v/d. -improved  Hypotension  -Likely 2/2 emesis/diarrhea.  -Once BP more stable, we can start adding back his meds as an outpatient  ARF on CKD Stage II  -Appears his baseline Cr is around 1.4.  -2.5 on admission down to 2.3 today.  -Acute factor likely related to prerenal azotemia from GI losses.   N/V/D  -Resolved.  -?Gastroenteritis.  -C Diff  PCR negative   Chest Pain  -Serial troponins and EKG  -Dr. [name] has seen and no further work up is indicated at this point .  Atrial Fibrillation with Bradycardia  -Will hold offending meds including digoxin, metoprolol, lasix, metolazone, spironolactone.  -May also be contributing to his weakness.  -Continue coumadin as dosed by pharmacy.   Chronic Systolic CHF  -Holding all meds given hypotension, bradycardia and ARF.  Cocaine abuse-  Asked psych social worker to see   DVT Prophylaxis  -Already fully anticoagulated on warfarin with therapeutic INR.     Discharge Exam: Filed Vitals:   11/15/11 1415 11/15/11 2105 11/16/11 0451 11/16/11 0705  BP: 116/86 125/86 109/79 102/80  Pulse: 62 59 60 62  Temp: 97.4 F (36.3 C) 98.2 F (36.8 C) 97.7 F (36.5 C)   TempSrc: Oral Oral Oral   Resp: 18 18 18    Height:      Weight:   99.882 kg (220 lb 3.2 oz)   SpO2: 100% 100% 98%     General: A+Ox3, NAD Cardiovascular: rrr Respiratory: clear anterior  Discharge Instructions  Discharge Orders    Future Appointments: Provider: Department: Dept Phone: Center:   02/01/2012 11:00 AM [facility] [place] [employer] Main Office [place]) [phone] [facility]     Future Orders Please Complete By Expires   Diet - low sodium heart healthy      Increase activity slowly      Discharge instructions      Comments:   PT/INR- Tuesday with Dr. [name]  Medication List     As of 11/16/2011  9:21 AM    STOP taking these medications         digoxin 0.25 MG tablet   Commonly known as: LANOXIN      isosorbide-hydrALAZINE 20-37.5 MG per tablet   Commonly known as: BIDIL      metolazone 5 MG tablet   Commonly known as: ZAROXOLYN      metoprolol succinate 25 MG 24 hr tablet   Commonly known as: TOPROL-XL      potassium chloride SA 20 MEQ tablet   Commonly known as: K-DUR,KLOR-CON      spironolactone 25 MG tablet   Commonly known as: ALDACTONE      TAKE these  medications         amiodarone 200 MG tablet   Commonly known as: PACERONE   Take 1 tablet (200 mg total) by mouth daily.      aspirin 81 MG EC tablet   Take 1 tablet (81 mg total) by mouth daily.      colchicine 0.6 MG tablet   Take 1 tablet (0.6 mg total) by mouth 2 (two) times daily. For gout      pantoprazole 40 MG tablet   Commonly known as: PROTONIX   Take 1 tablet (40 mg total) by mouth daily at 12 noon.      rosuvastatin 5 MG tablet   Commonly known as: CRESTOR   Take 1 tablet (5 mg total) by mouth at bedtime.      warfarin 7.5 MG tablet   Commonly known as: COUMADIN   Take 1 tablet (7.5 mg total) by mouth daily.      ASK your doctor about these medications         furosemide 80 MG tablet   Commonly known as: LASIX   Take 1 tablet (80 mg total) by mouth daily.          The results of significant diagnostics from this hospitalization (including imaging, microbiology, ancillary and laboratory) are listed below for reference.    Significant Diagnostic Studies: Portable Chest 1 View  11/13/2011  *RADIOLOGY REPORT*  Clinical Data: Chest pain and shortness of breath.  PORTABLE CHEST - 1 VIEW  Comparison: 10/07/2011  Findings: Pacer / AICD device with lead right ventricle.  Lower cervical spine fixation. Numerous leads and wires project over the chest.  Midline trachea.  Normal heart size.  No pleural effusion or pneumothorax.  No congestive failure.  Minimal biapical pleural thickening. Clear lungs.  IMPRESSION: No acute cardiopulmonary disease.   Original Report Authenticated By: Jeronimo Greaves, M.D.     Microbiology: Recent Results (from the past 240 hour(s))  CLOSTRIDIUM DIFFICILE BY PCR     Status: Normal   Collection Time   11/15/11  5:03 AM      Component Value Range Status Comment   C difficile by pcr NEGATIVE  NEGATIVE Final      Labs: Basic Metabolic Panel:  Lab 11/15/11 0960 11/15/11 0525 11/14/11 0642 11/13/11 1914  NA -- 135 137 133*  K -- 4.6 4.7  5.8*  CL -- 102 101 97  CO2 -- 23 27 27   GLUCOSE -- 87 97 88  BUN -- 42* 50* 48*  CREATININE -- 2.01* 2.37* 2.53*  CALCIUM -- 9.7 10.0 10.1  MG 2.1 -- -- --  PHOS -- -- -- --   Liver Function Tests:  Lab 11/13/11 1914  AST 77*  ALT 49  ALKPHOS 76  BILITOT  0.4  PROT 8.4*  ALBUMIN 3.4*   No results found for this basename: LIPASE:5,AMYLASE:5 in the last 168 hours No results found for this basename: AMMONIA:5 in the last 168 hours CBC:  Lab 11/14/11 0642 11/13/11 1914  WBC 5.2 4.2  NEUTROABS -- --  HGB 15.8 15.6  HCT 45.7 45.2  MCV 87.0 86.8  PLT 251 266   Cardiac Enzymes:  Lab 11/14/11 0642 11/14/11 0047 11/13/11 1914  CKTOTAL -- -- --  CKMB -- -- --  CKMBINDEX -- -- --  TROPONINI <0.30 <0.30 <0.30   BNP: BNP (last 3 results)  Basename 11/13/11 1914 10/10/11 0515 10/07/11 2218  PROBNP 511.7* 9821.0* 3313.0*   CBG: No results found for this basename: GLUCAP:5 in the last 168 hours     Signed:  Marlin Canary  [facility] 11/16/2011, 9:21 AM

## 2011-11-16 NOTE — Progress Notes (Signed)
Patient Discharge Instructions:  Next Level Care Provider Has Access to the EMR, 11/16/11 Patient was transferred to the [facility] 4E telemetry entire EMR available via [facility]/[employer] Access. Jerelene Redden, 11/16/2011, 2:39 PM

## 2011-11-16 NOTE — Progress Notes (Addendum)
Pt with 6 beats of v-tach. Pt reports feeling "a few palpitations" says that this happens frequently. BP 102/80, HR 60 afib. Pt lying in bed in  No acute distress. Will notify Dr. [name]. Will monitor. Dr. [name] aware, no new orders.

## 2011-12-08 IMAGING — CR DG CHEST 2V
2 series · 2 of 2 positions shown · non-contrast
Comparison: 04/13/2010.

CLINICAL DATA: Chest pain and shortness of breath.

CHEST - 2 VIEW

[w chest pa]
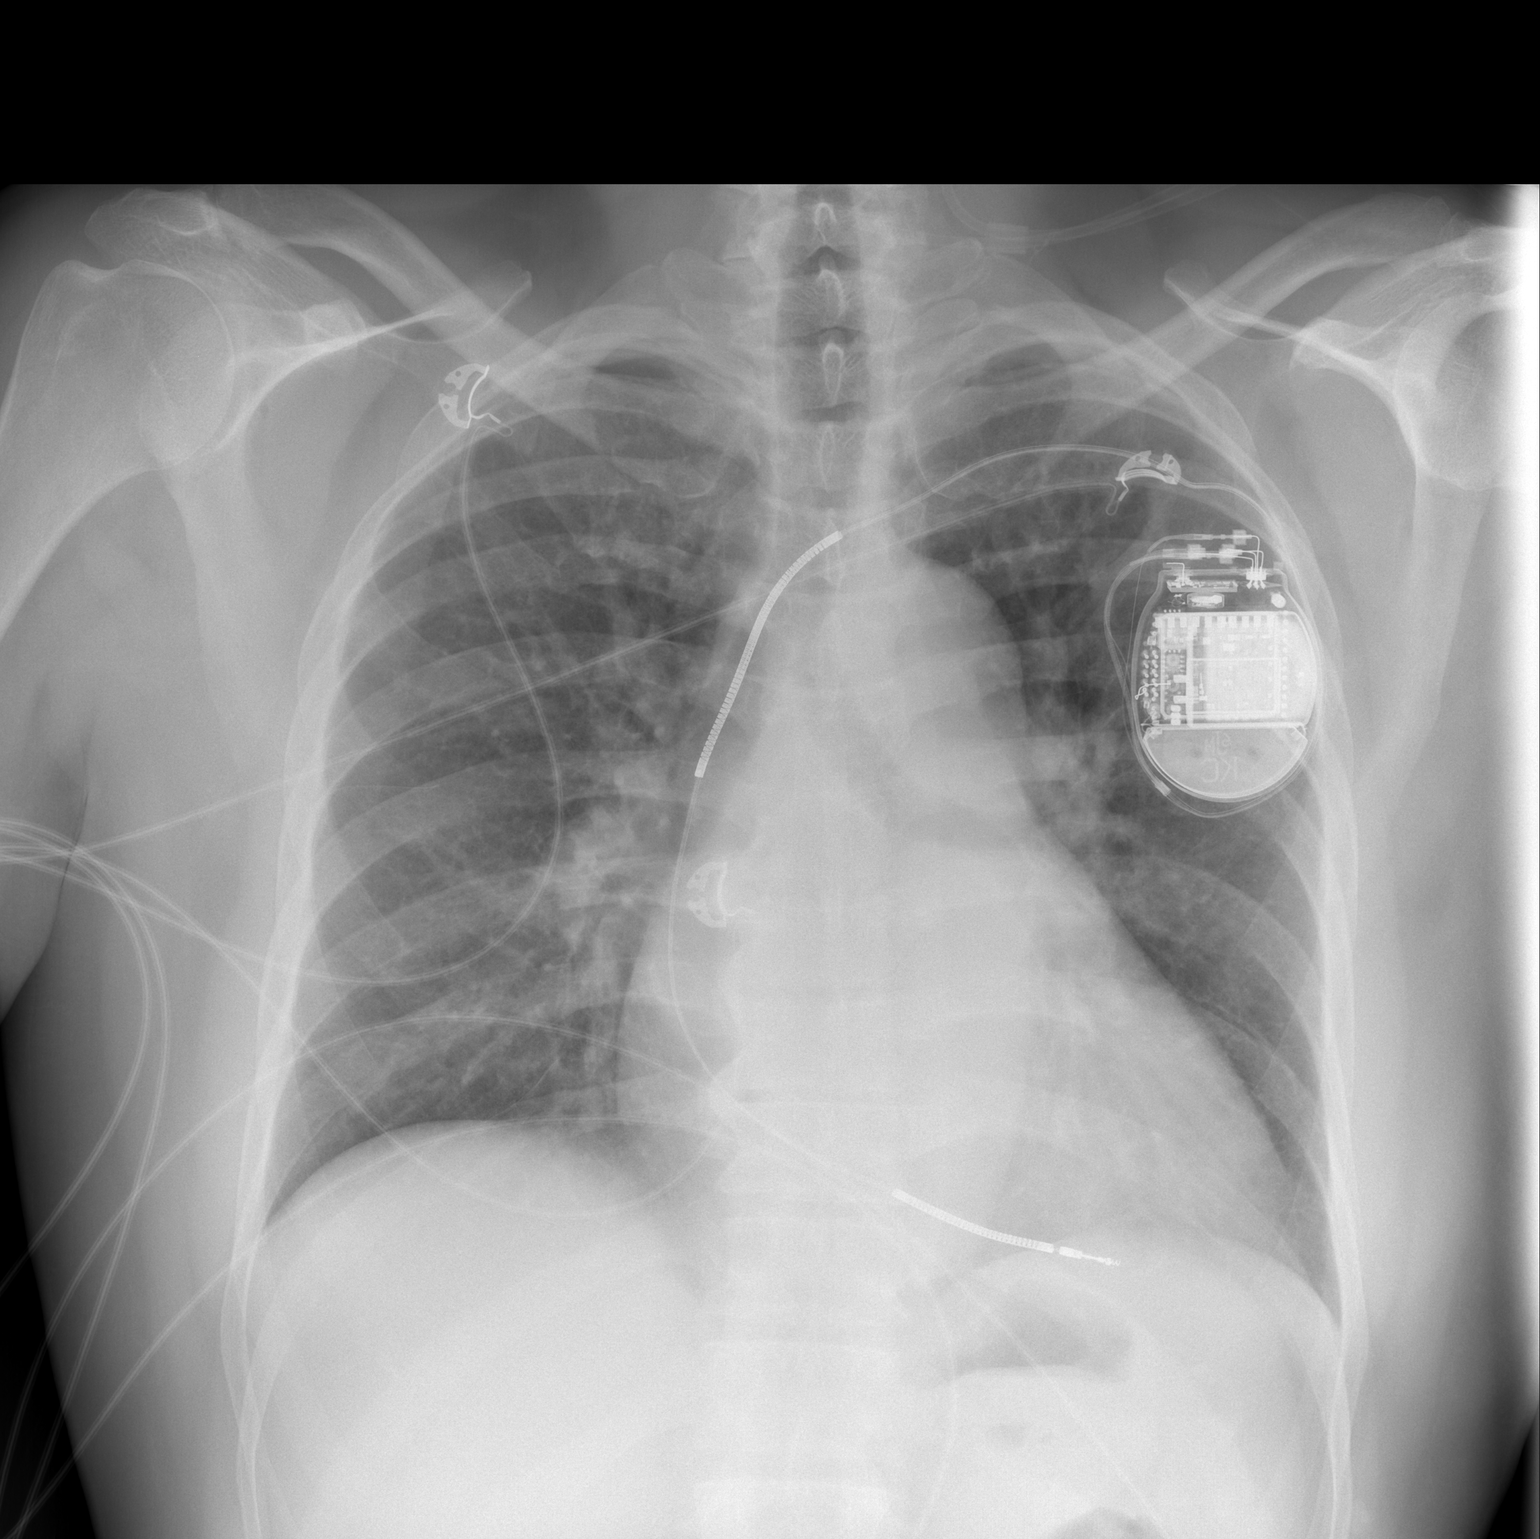

[w chest lat]
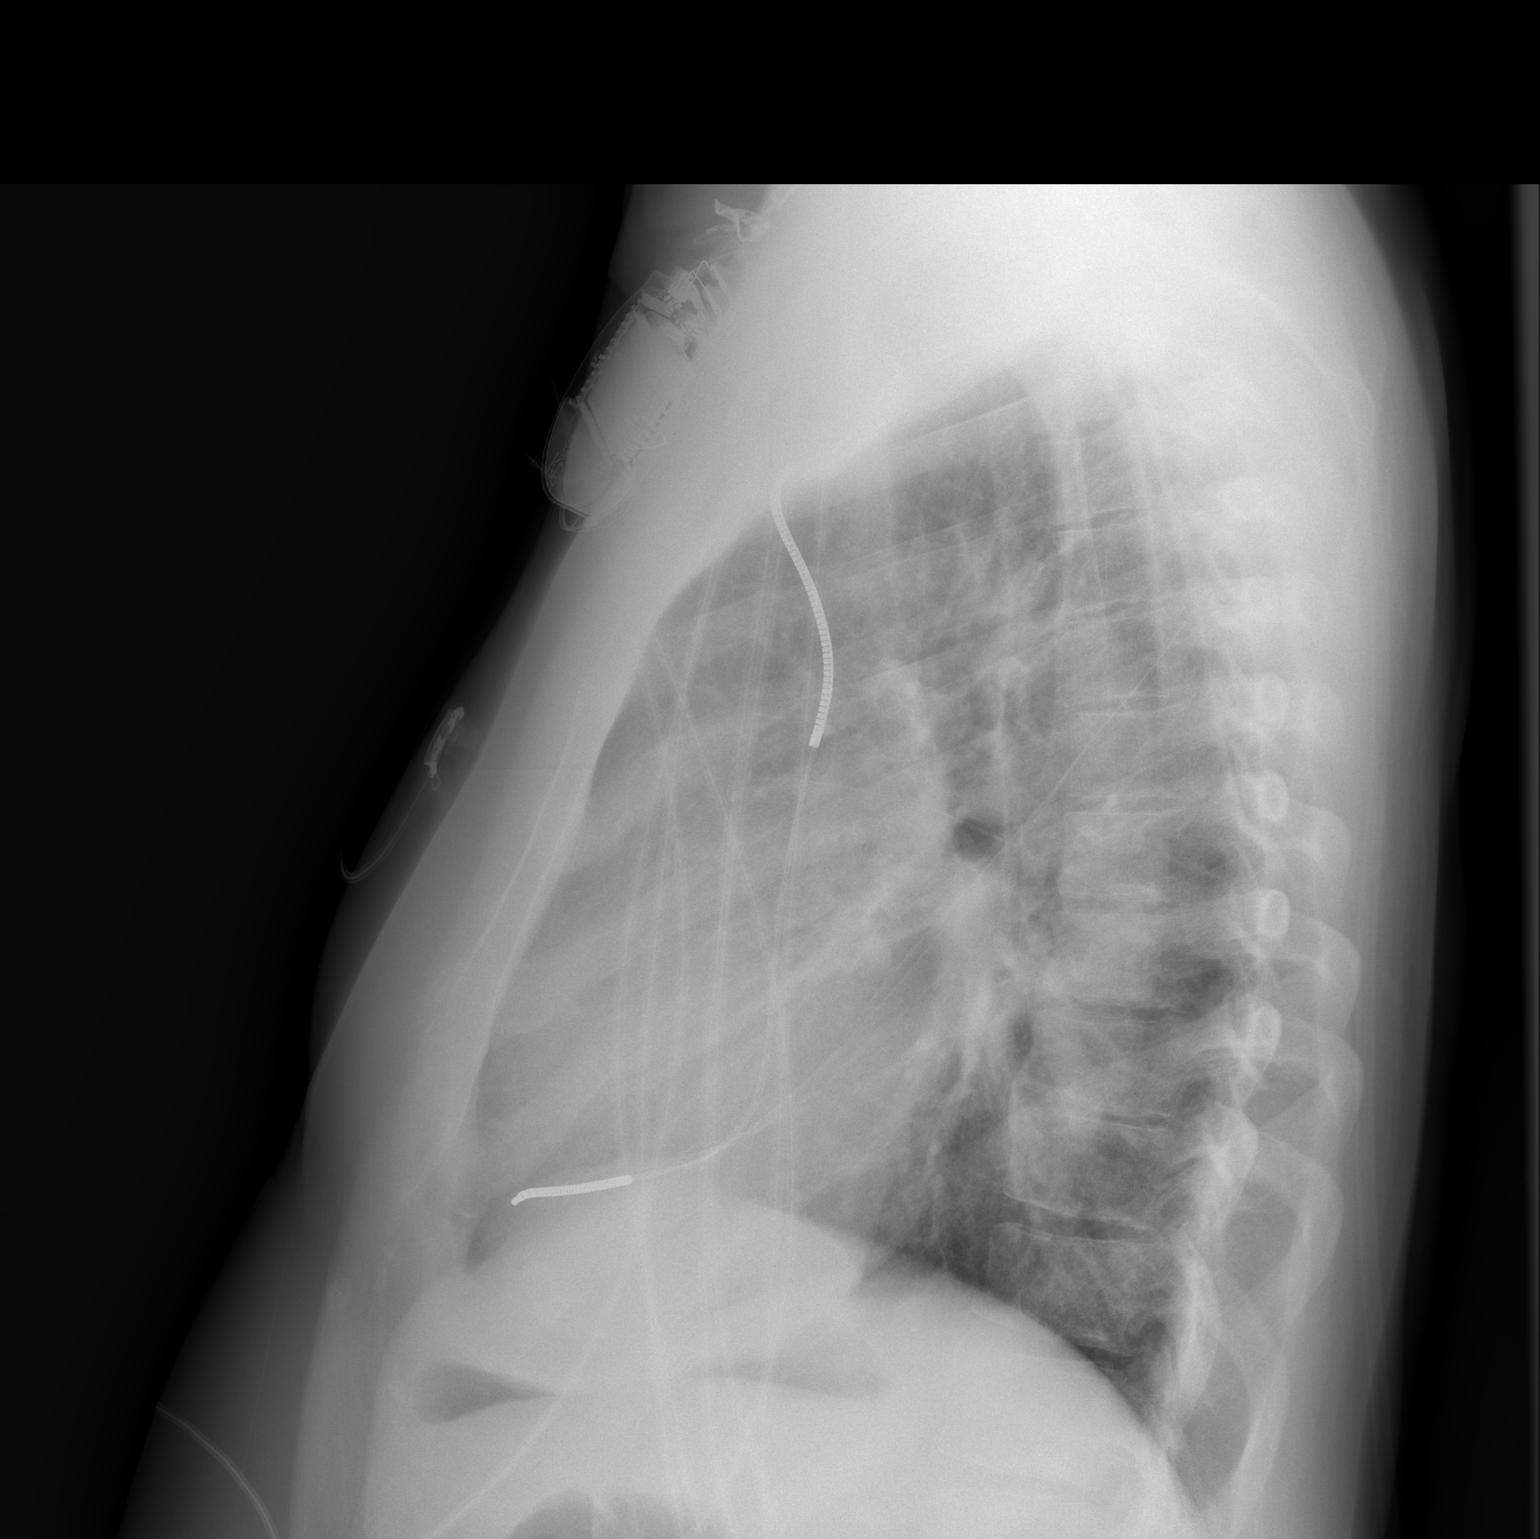

[2 of 2 positions shown; findings below may reference images not displayed]

FINDINGS: The cardiopericardial silhouette is enlarged. There is
pulmonary vascular congestion without overt pulmonary edema.  No
airspace pulmonary edema or focal airspace consolidation.  No
pleural effusion.  Left-sided pacer / AICD remains in place.
Telemetry leads overlie the chest.
IMPRESSION: Cardiomegaly with mild vascular congestion.

## 2011-12-09 ENCOUNTER — Inpatient Hospital Stay (HOSPITAL_COMMUNITY)
Admission: EM | Admit: 2011-12-09 | Discharge: 2011-12-15 | DRG: 292 | Disposition: A | Discharge status: Home / Self Care | Payer: PRIVATE HEALTH INSURANCE | Attending: Cardiovascular Disease | Admitting: Cardiovascular Disease

## 2011-12-09 ENCOUNTER — Encounter (HOSPITAL_COMMUNITY): Payer: Self-pay | Admitting: Emergency Medicine

## 2011-12-09 ENCOUNTER — Emergency Department (HOSPITAL_COMMUNITY): Payer: PRIVATE HEALTH INSURANCE

## 2011-12-09 DIAGNOSIS — Z981 Arthrodesis status: Secondary | ICD-10-CM

## 2011-12-09 DIAGNOSIS — I2510 Atherosclerotic heart disease of native coronary artery without angina pectoris: Secondary | ICD-10-CM | POA: Diagnosis present

## 2011-12-09 DIAGNOSIS — K219 Gastro-esophageal reflux disease without esophagitis: Secondary | ICD-10-CM | POA: Diagnosis present

## 2011-12-09 DIAGNOSIS — I129 Hypertensive chronic kidney disease with stage 1 through stage 4 chronic kidney disease, or unspecified chronic kidney disease: Secondary | ICD-10-CM | POA: Diagnosis present

## 2011-12-09 DIAGNOSIS — I472 Ventricular tachycardia, unspecified: Secondary | ICD-10-CM | POA: Diagnosis present

## 2011-12-09 DIAGNOSIS — I4729 Other ventricular tachycardia: Secondary | ICD-10-CM | POA: Diagnosis present

## 2011-12-09 DIAGNOSIS — I5020 Unspecified systolic (congestive) heart failure: Secondary | ICD-10-CM

## 2011-12-09 DIAGNOSIS — E7800 Pure hypercholesterolemia, unspecified: Secondary | ICD-10-CM | POA: Diagnosis present

## 2011-12-09 DIAGNOSIS — D869 Sarcoidosis, unspecified: Secondary | ICD-10-CM | POA: Diagnosis present

## 2011-12-09 DIAGNOSIS — Z95810 Presence of automatic (implantable) cardiac defibrillator: Secondary | ICD-10-CM

## 2011-12-09 DIAGNOSIS — I428 Other cardiomyopathies: Secondary | ICD-10-CM | POA: Diagnosis present

## 2011-12-09 DIAGNOSIS — I509 Heart failure, unspecified: Secondary | ICD-10-CM | POA: Diagnosis present

## 2011-12-09 DIAGNOSIS — M109 Gout, unspecified: Secondary | ICD-10-CM | POA: Diagnosis present

## 2011-12-09 DIAGNOSIS — I4891 Unspecified atrial fibrillation: Secondary | ICD-10-CM | POA: Diagnosis present

## 2011-12-09 DIAGNOSIS — I252 Old myocardial infarction: Secondary | ICD-10-CM

## 2011-12-09 DIAGNOSIS — N184 Chronic kidney disease, stage 4 (severe): Secondary | ICD-10-CM | POA: Diagnosis present

## 2011-12-09 DIAGNOSIS — I5023 Acute on chronic systolic (congestive) heart failure: Principal | ICD-10-CM | POA: Diagnosis present

## 2011-12-09 LAB — CBC WITH DIFFERENTIAL/PLATELET
Eosinophils Relative: 3 % (ref 0–5)
HCT: 37.1 % — ABNORMAL LOW (ref 39.0–52.0)
Hemoglobin: 12.1 g/dL — ABNORMAL LOW (ref 13.0–17.0)
Lymphocytes Relative: 31 % (ref 12–46)
Lymphs Abs: 2 10*3/uL (ref 0.7–4.0)
MCV: 91.4 fL (ref 78.0–100.0)
Monocytes Relative: 13 % — ABNORMAL HIGH (ref 3–12)
Platelets: 220 10*3/uL (ref 150–400)
RBC: 4.06 MIL/uL — ABNORMAL LOW (ref 4.22–5.81)
WBC: 6.6 10*3/uL (ref 4.0–10.5)

## 2011-12-09 LAB — PROTIME-INR: Prothrombin Time: 13.1 seconds (ref 11.6–15.2)

## 2011-12-09 LAB — TROPONIN I: Troponin I: <0.3 ng/mL (ref <0.30)

## 2011-12-09 LAB — BASIC METABOLIC PANEL
CO2: 20 mEq/L (ref 19–32)
Calcium: 9.6 mg/dL (ref 8.4–10.5)
Glucose, Bld: 94 mg/dL (ref 70–99)
Sodium: 141 mEq/L (ref 135–145)

## 2011-12-09 MED ORDER — WARFARIN - PHYSICIAN DOSING INPATIENT: Freq: Every day | Status: DC

## 2011-12-09 MED ORDER — ASPIRIN 81 MG PO CHEW
324.0000 mg | CHEWABLE_TABLET | Freq: Once | ORAL | Status: AC
  Administered 2011-12-09: 324 mg via ORAL
  Filled 2011-12-09: qty 4

## 2011-12-09 MED ORDER — PANTOPRAZOLE SODIUM 40 MG PO TBEC
40.0000 mg | DELAYED_RELEASE_TABLET | Freq: Every day | ORAL | Status: DC
  Administered 2011-12-10 – 2011-12-15 (×6): 40 mg via ORAL
  Filled 2011-12-09 (×6): qty 1

## 2011-12-09 MED ORDER — FUROSEMIDE 10 MG/ML IJ SOLN
80.0000 mg | Freq: Once | INTRAMUSCULAR | Status: AC
  Administered 2011-12-09: 80 mg via INTRAVENOUS
  Filled 2011-12-09: qty 8

## 2011-12-09 MED ORDER — SODIUM CHLORIDE 0.9 % IV SOLN
250.0000 mL | INTRAVENOUS | Status: DC | PRN
  Administered 2011-12-13: 250 mL via INTRAVENOUS

## 2011-12-09 MED ORDER — ACETAMINOPHEN 325 MG PO TABS: 650.0000 mg | ORAL_TABLET | ORAL | Status: DC | PRN

## 2011-12-09 MED ORDER — WARFARIN SODIUM 7.5 MG PO TABS
7.5000 mg | ORAL_TABLET | Freq: Every day | ORAL | Status: DC
  Administered 2011-12-09: 7.5 mg via ORAL
  Filled 2011-12-09 (×2): qty 1

## 2011-12-09 MED ORDER — BIOTENE DRY MOUTH MT LIQD
15.0000 mL | OROMUCOSAL | Status: DC | PRN
Start: 2011-12-09 — End: 2011-12-15

## 2011-12-09 MED ORDER — FUROSEMIDE 10 MG/ML IJ SOLN
80.0000 mg | Freq: Two times a day (BID) | INTRAMUSCULAR | Status: DC
  Administered 2011-12-10 – 2011-12-13 (×7): 80 mg via INTRAVENOUS
  Filled 2011-12-09 (×9): qty 8

## 2011-12-09 MED ORDER — SODIUM CHLORIDE 0.9 % IJ SOLN
3.0000 mL | Freq: Two times a day (BID) | INTRAMUSCULAR | Status: DC
  Administered 2011-12-10 – 2011-12-15 (×7): 3 mL via INTRAVENOUS

## 2011-12-09 MED ORDER — OXYCODONE-ACETAMINOPHEN 5-325 MG PO TABS
1.0000 | ORAL_TABLET | Freq: Four times a day (QID) | ORAL | Status: DC | PRN
Start: 2011-12-09 — End: 2011-12-15
  Administered 2011-12-09 – 2011-12-12 (×3): 1 via ORAL
  Administered 2011-12-13: 2 via ORAL
  Administered 2011-12-14 – 2011-12-15 (×2): 1 via ORAL
  Filled 2011-12-09 (×5): qty 1
  Filled 2011-12-09: qty 2
  Filled 2011-12-09 (×2): qty 1

## 2011-12-09 MED ORDER — ZOLPIDEM TARTRATE 5 MG PO TABS
5.0000 mg | ORAL_TABLET | Freq: Every evening | ORAL | Status: DC | PRN
  Administered 2011-12-09 – 2011-12-12 (×2): 5 mg via ORAL
  Filled 2011-12-09 (×2): qty 1

## 2011-12-09 MED ORDER — AMIODARONE HCL 200 MG PO TABS
200.0000 mg | ORAL_TABLET | Freq: Every day | ORAL | Status: DC
  Administered 2011-12-10 – 2011-12-15 (×6): 200 mg via ORAL
  Filled 2011-12-09 (×6): qty 1

## 2011-12-09 MED ORDER — HEPARIN BOLUS VIA INFUSION
4000.0000 [IU] | Freq: Once | INTRAVENOUS | Status: AC
  Administered 2011-12-09: 4000 [IU] via INTRAVENOUS

## 2011-12-09 MED ORDER — DIGOXIN 250 MCG PO TABS
0.2500 mg | ORAL_TABLET | Freq: Every day | ORAL | Status: AC
  Administered 2011-12-10 – 2011-12-12 (×3): 0.25 mg via ORAL
  Filled 2011-12-09 (×3): qty 1

## 2011-12-09 MED ORDER — SODIUM CHLORIDE 0.9 % IJ SOLN
3.0000 mL | INTRAMUSCULAR | Status: DC | PRN
  Administered 2011-12-12: 3 mL via INTRAVENOUS

## 2011-12-09 MED ORDER — ALPRAZOLAM 0.25 MG PO TABS
0.2500 mg | ORAL_TABLET | Freq: Two times a day (BID) | ORAL | Status: DC | PRN
  Filled 2011-12-09: qty 1

## 2011-12-09 MED ORDER — HEPARIN (PORCINE) IN NACL 100-0.45 UNIT/ML-% IJ SOLN
1150.0000 [IU]/h | INTRAMUSCULAR | Status: DC
  Administered 2011-12-09 – 2011-12-11 (×3): 1450 [IU]/h via INTRAVENOUS
  Administered 2011-12-13: 1350 [IU]/h via INTRAVENOUS
  Filled 2011-12-09 (×8): qty 250

## 2011-12-09 MED ORDER — COLCHICINE 0.6 MG PO TABS
0.6000 mg | ORAL_TABLET | Freq: Two times a day (BID) | ORAL | Status: DC
  Administered 2011-12-09 – 2011-12-14 (×9): 0.6 mg via ORAL
  Filled 2011-12-09 (×13): qty 1

## 2011-12-09 MED ORDER — ATORVASTATIN CALCIUM 20 MG PO TABS
20.0000 mg | ORAL_TABLET | Freq: Every day | ORAL | Status: DC
  Administered 2011-12-10 – 2011-12-14 (×5): 20 mg via ORAL
  Filled 2011-12-09 (×6): qty 1

## 2011-12-09 MED ORDER — ONDANSETRON HCL 4 MG/2ML IJ SOLN
4.0000 mg | Freq: Four times a day (QID) | INTRAMUSCULAR | Status: DC | PRN
  Administered 2011-12-11 – 2011-12-14 (×3): 4 mg via INTRAVENOUS
  Filled 2011-12-09 (×3): qty 2

## 2011-12-09 NOTE — ED Notes (Signed)
Pt. Stated, I'm also having a bad case of gout in my left toe, ankle and knee.

## 2011-12-09 NOTE — ED Notes (Signed)
Pt brought to room, pt undressed, in gown, on monitor, continuous pulse oximetry and blood pressure cuff

## 2011-12-09 NOTE — ED Notes (Signed)
Pt. Stated, I've been SOB since Monday with all this bloatiness.  I couldn't come to the Dr cause my best friends mother died. i feel nauseated also.

## 2011-12-09 NOTE — H&P (Signed)
[name] is an 61 y.o. male.   Chief Complaint: Chest pain with SOB. HPI: 61 yo AAM with history of CHF, last EF 20-25%, CKD, gout and HTN, presents with worsening SOB, chest pressure, orthopnea and abdominal swelling since 1 week. No leg edema. + Gout.    Past Medical History  Diagnosis Date  . CHF (congestive heart failure)   . Sarcoidosis   . Cardiomyopathy, dilated, nonischemic     non ischemic by cath  . Acute on chronic systolic heart failure   . Automatic implantable cardiac defibrillator in situ   . Atrial fibrillation   . NSVT (nonsustained ventricular tachycardia)   . GERD (gastroesophageal reflux disease)   . Hypercholesteremia   . Myocardial infarction   . Shortness of breath   . Chronic kidney disease (CKD), stage III (moderate)   . Pacemaker   . Anginal pain   . Gout   . Hypertension     [name]  . Coronary artery disease       Past Surgical History  Procedure Date  . Back surgery 1987    Ruptured disk repair  . Pacemaker insertion     with ICD  . Tee without cardioversion 01/17/2011    Procedure: TRANSESOPHAGEAL ECHOCARDIOGRAM (TEE);  Surgeon: Ricki Rodriguez, MD;  Location: [facility] ENDOSCOPY;  Service: Cardiovascular;  Laterality: N/A;  . Cardioversion 01/17/2011    Procedure: CARDIOVERSION;  Surgeon: Ricki Rodriguez, MD;  Location: [facility] ENDOSCOPY;  Service: Cardiovascular;  Laterality: N/A;  . Cardiac catheterization   . Insert / replace / remove pacemaker   . Anterior cervical decomp/discectomy fusion 08/21/2011    Procedure: ANTERIOR CERVICAL DECOMPRESSION/DISCECTOMY FUSION 2 LEVELS;  Surgeon: Eldred Manges, MD;  Location: [facility] OR;  Service: Orthopedics;  Laterality: N/A;  C5-6, C6-7 Anterior Cervical Discectomy and Fusion, allograft, plate    Family History  Problem Relation Age of Onset  . Heart disease    . Heart failure    . Stroke    . Anesthesia problems Neg Hx   . Hypotension Neg Hx   . Malignant hyperthermia Neg Hx   . Pseudochol  deficiency Neg Hx    Social History:  reports that he has never smoked. He has never used smokeless tobacco. He reports that he uses illicit drugs ("Crack" cocaine and Marijuana). He reports that he does not drink alcohol.  Allergies: No Known Allergies   (Not in a hospital admission)  Results for orders placed during the hospital encounter of 12/09/11 (from the past 48 hour(s))  CBC WITH DIFFERENTIAL     Status: Abnormal   Collection Time   12/09/11  4:50 PM      Component Value Range Comment   WBC 6.6  4.0 - 10.5 K/uL    RBC 4.06 (*) 4.22 - 5.81 MIL/uL    Hemoglobin 12.1 (*) 13.0 - 17.0 g/dL    HCT 29.5 (*) 62.1 - 52.0 %    MCV 91.4  78.0 - 100.0 fL    MCH 29.8  26.0 - 34.0 pg    MCHC 32.6  30.0 - 36.0 g/dL    RDW 30.8  65.7 - 84.6 %    Platelets 220  150 - 400 K/uL    Neutrophils Relative 53  43 - 77 %    Neutro Abs 3.5  1.7 - 7.7 K/uL    Lymphocytes Relative 31  12 - 46 %    Lymphs Abs 2.0  0.7 - 4.0 K/uL  Monocytes Relative 13 (*) 3 - 12 %    Monocytes Absolute 0.9  0.1 - 1.0 K/uL    Eosinophils Relative 3  0 - 5 %    Eosinophils Absolute 0.2  0.0 - 0.7 K/uL    Basophils Relative 0  0 - 1 %    Basophils Absolute 0.0  0.0 - 0.1 K/uL   BASIC METABOLIC PANEL     Status: Abnormal   Collection Time   12/09/11  4:50 PM      Component Value Range Comment   Sodium 141  135 - 145 mEq/L    Potassium 4.1  3.5 - 5.1 mEq/L    Chloride 110  96 - 112 mEq/L    CO2 20  19 - 32 mEq/L    Glucose, Bld 94  70 - 99 mg/dL    BUN 23  6 - 23 mg/dL    Creatinine, Ser 7.84 (*) 0.50 - 1.35 mg/dL    Calcium 9.6  8.4 - 69.6 mg/dL    GFR calc non Af Amer 34 (*) >90 mL/min    GFR calc Af Amer 39 (*) >90 mL/min   PRO B NATRIURETIC PEPTIDE     Status: Abnormal   Collection Time   12/09/11  4:50 PM      Component Value Range Comment   Pro B Natriuretic peptide (BNP) 11000.0 (*) 0 - 125 pg/mL   TROPONIN I     Status: Normal   Collection Time   12/09/11  4:50 PM      Component Value Range  Comment   Troponin I <0.30  <0.30 ng/mL   PROTIME-INR     Status: Normal   Collection Time   12/09/11  5:01 PM      Component Value Range Comment   Prothrombin Time 13.1  11.6 - 15.2 seconds    INR 1.00  0.00 - 1.49    Dg Chest 2 View  12/09/2011  *RADIOLOGY REPORT*  Clinical Data: Shortness of breath.  CHEST - 2 VIEW  Comparison: 11/13/2011.  Findings: The heart is borderline enlarged.  The right ventricular pacer wire / AICD is unchanged.  There is vascular congestion and pulmonary edema consistent with CHF.  No definite pleural effusions.  IMPRESSION: CHF.   Original Report Authenticated By: Rudie Meyer, M.D.      Review of Systems  Constitutional: Negative for fever, chills, appetite change and fatigue.  HENT: Negative for congestion, rhinorrhea, neck pain and neck stiffness.  Eyes: Negative for photophobia and visual disturbance.  Respiratory: Positive for shortness of breath. Negative for cough and wheezing.  Cardiovascular: Positive for chest pain. Negative for palpitations and leg swelling.  Gastrointestinal: Positive for nausea, vomiting, diarrhea and abdominal distention. Negative for abdominal pain.  Genitourinary: Positive for decreased urine volume. Negative for dysuria, urgency and hematuria.  Musculoskeletal: Positive for arthralgias (right great toe and ankle, chronic). Negative for myalgias.  Skin: Negative for pallor and rash.  Neurological: Negative for dizziness, syncope, weakness and headaches.  Psychiatric/Behavioral: Negative for confusion and agitation.   Blood pressure 134/95, pulse 106, temperature 98.1 F (36.7 C), resp. rate 22, height 6' 3.5" (1.918 m), weight 103.874 kg (229 lb), SpO2 100.00%.  Physical Exam    Constitutional: He appears well-developed and well-nourished. He is cooperative. No distress.  HENT: Head: Normocephalic and atraumatic. Mouth/Throat: Oropharynx is clear and moist and mucous membranes are normal. Eyes: Brown, Conjunctivae normal  and EOM are normal. Pupils are equal, round, and reactive to light.  Neck: Trachea normal and normal range of motion. + JVD. Cardiovascular: S1 normal, S2 normal and normal heart sounds. An irregular rhythm present. No extrasystoles are present. Tachycardia present. Exam reveals no decreased pulses.  Pulmonary/Chest: Tachypnea noted. Bilateral crackles at bases. Abdominal: Soft. distened and no tenderness. There is no rebound and no guarding.  Musculoskeletal: Great toe and ankle TTP, no swelling or redness  Neurological: He is alert and oriented to person, place, and time. He has normal strength and normal reflexes. No cranial nerve deficit  Assessment/Plan A. Fibrillation with rapid ventricular response Acute left heart systolic failure Hypertension CKD, III Gout CAD Chest pain S/P ICD Dilated cardiomyopathy  Admit, IV lasix, add Lanoxin for HR control and CHF   Wilbur Oakland S 12/09/2011, 7:28 PM

## 2011-12-09 NOTE — ED Provider Notes (Signed)
History     CSN: [id number]  Arrival date & time 12/09/11  1440   First MD Initiated Contact with Patient 12/09/11 1553     Chief Complaint  Patient presents with  . Shortness of Breath   HPI: [name] is a 61 yo AAM with history of CHF, last EF 20-25%, CKD, gout and HTN, presents with worsening SOB, chest pressure, orthopnea and abdominal swelling. Symptoms started 6 days ago with worsening SOB, decreased exercise tolerance and orthopnea. These symptoms have progressively worsened. His abdomen has become more distended as well, but denies pain. He also endorses intermittent chest pressure, substernal, non-radiating, no particular exacerbating factors, no alleviating factors. Chest pressure appears to be chronic in nature but did worsen last night. No associated diaphoresis but does endorse nausea. Normally he is able to perform all ADL's without difficulty. Now he gets SOB after walking 20 steps. No syncopal episodes. Two days ago he had a few episodes of diarrhea and vomiting. These symptoms are typical of his previous episodes of CHF.   Past Medical History  Diagnosis Date  . CHF (congestive heart failure)   . Sarcoidosis   . Cardiomyopathy, dilated, nonischemic     non ischemic by cath  . Acute on chronic systolic heart failure   . Automatic implantable cardiac defibrillator in situ   . Atrial fibrillation   . NSVT (nonsustained ventricular tachycardia)   . GERD (gastroesophageal reflux disease)   . Hypercholesteremia   . Myocardial infarction   . Shortness of breath   . Chronic kidney disease (CKD), stage III (moderate)   . Pacemaker   . Anginal pain   . Gout   . Hypertension     [name]  . Coronary artery disease     Past Surgical History  Procedure Date  . Back surgery 1987    Ruptured disk repair  . Pacemaker insertion     with ICD  . Tee without cardioversion 01/17/2011    Procedure: TRANSESOPHAGEAL ECHOCARDIOGRAM (TEE);  Surgeon: Ricki Rodriguez, MD;   Location: [facility] ENDOSCOPY;  Service: Cardiovascular;  Laterality: N/A;  . Cardioversion 01/17/2011    Procedure: CARDIOVERSION;  Surgeon: Ricki Rodriguez, MD;  Location: [facility] ENDOSCOPY;  Service: Cardiovascular;  Laterality: N/A;  . Cardiac catheterization   . Insert / replace / remove pacemaker   . Anterior cervical decomp/discectomy fusion 08/21/2011    Procedure: ANTERIOR CERVICAL DECOMPRESSION/DISCECTOMY FUSION 2 LEVELS;  Surgeon: Eldred Manges, MD;  Location: [facility] OR;  Service: Orthopedics;  Laterality: N/A;  C5-6, C6-7 Anterior Cervical Discectomy and Fusion, allograft, plate    Family History  Problem Relation Age of Onset  . Heart disease    . Heart failure    . Stroke    . Anesthesia problems Neg Hx   . Hypotension Neg Hx   . Malignant hyperthermia Neg Hx   . Pseudochol deficiency Neg Hx     History  Substance Use Topics  . Smoking status: Never Smoker   . Smokeless tobacco: Never Used  . Alcohol Use: No     Review of Systems  Constitutional: Negative for fever, chills, appetite change and fatigue.  HENT: Negative for congestion, rhinorrhea, neck pain and neck stiffness.   Eyes: Negative for photophobia and visual disturbance.  Respiratory: Positive for shortness of breath. Negative for cough and wheezing.   Cardiovascular: Positive for chest pain. Negative for palpitations and leg swelling.  Gastrointestinal: Positive for nausea, vomiting, diarrhea and abdominal distention. Negative for abdominal pain.  Genitourinary: Positive for decreased urine volume. Negative for dysuria, urgency and hematuria.  Musculoskeletal: Positive for arthralgias (right great toe and ankle, chronic). Negative for myalgias.  Skin: Negative for pallor and rash.  Neurological: Negative for dizziness, syncope, weakness and headaches.  Psychiatric/Behavioral: Negative for confusion and agitation.  All other systems reviewed and are negative.   Allergies  Review of patient's allergies indicates no  known allergies.  Home Medications   Current Outpatient Rx  Name  Route  Sig  Dispense  Refill  . AMIODARONE HCL 200 MG PO TABS   Oral   Take 1 tablet (200 mg total) by mouth daily.         . ASPIRIN 81 MG PO TBEC   Oral   Take 1 tablet (81 mg total) by mouth daily.         . COLCHICINE 0.6 MG PO TABS   Oral   Take 1 tablet (0.6 mg total) by mouth 2 (two) times daily. For gout   60 tablet   0   . FUROSEMIDE 80 MG PO TABS   Oral   Take 80 mg by mouth 2 (two) times daily.         Marland Kitchen PANTOPRAZOLE SODIUM 40 MG PO TBEC   Oral   Take 1 tablet (40 mg total) by mouth daily at 12 noon.         Marland Kitchen ROSUVASTATIN CALCIUM 5 MG PO TABS   Oral   Take 1 tablet (5 mg total) by mouth at bedtime.         . WARFARIN SODIUM 7.5 MG PO TABS   Oral   Take 1 tablet (7.5 mg total) by mouth daily.   30 tablet   0   . ZOLPIDEM TARTRATE 5 MG PO TABS   Oral   Take 5 mg by mouth at bedtime as needed. For sleep           BP 131/103  Pulse 92  Temp 98.1 F (36.7 C)  Resp 42  SpO2 100%  Physical Exam  Nursing note and vitals reviewed. Constitutional: He is oriented to person, place, and time. He appears well-developed and well-nourished. He is cooperative. No distress.  HENT:  Head: Normocephalic and atraumatic.  Mouth/Throat: Oropharynx is clear and moist and mucous membranes are normal.  Eyes: Conjunctivae normal and EOM are normal. Pupils are equal, round, and reactive to light.  Neck: Trachea normal and normal range of motion.  Cardiovascular: S1 normal, S2 normal and normal heart sounds.  An irregular rhythm present.  No extrasystoles are present. Tachycardia present.  Exam reveals no decreased pulses.   Pulmonary/Chest: Tachypnea noted. He has no decreased breath sounds. He has rales (at bases).  Abdominal: Soft. He exhibits distension. He exhibits no fluid wave. There is no tenderness. There is no rebound and no guarding.  Musculoskeletal:       Great toe and ankle TTP,  no swelling or redness  Neurological: He is alert and oriented to person, place, and time. He has normal strength and normal reflexes. No cranial nerve deficit or sensory deficit. GCS eye subscore is 4. GCS verbal subscore is 5. GCS motor subscore is 6.    ED Course  Procedures  Labs Reviewed  CBC WITH DIFFERENTIAL - Abnormal; Notable for the following:    RBC 4.06 (*)     Hemoglobin 12.1 (*)     HCT 37.1 (*)     Monocytes Relative 13 (*)     All  other components within normal limits  BASIC METABOLIC PANEL - Abnormal; Notable for the following:    Creatinine, Ser 2.02 (*)     GFR calc non Af Amer 34 (*)     GFR calc Af Amer 39 (*)     All other components within normal limits  PRO B NATRIURETIC PEPTIDE - Abnormal; Notable for the following:    Pro B Natriuretic peptide (BNP) 11000.0 (*)     All other components within normal limits  TROPONIN I  PROTIME-INR  CBC  PROTIME-INR  BASIC METABOLIC PANEL  HEPARIN LEVEL (UNFRACTIONATED)  TSH   Dg Chest 2 View  12/09/2011  *RADIOLOGY REPORT*  Clinical Data: Shortness of breath.  CHEST - 2 VIEW  Comparison: 11/13/2011.  Findings: The heart is borderline enlarged.  The right ventricular pacer wire / AICD is unchanged.  There is vascular congestion and pulmonary edema consistent with CHF.  No definite pleural effusions.  IMPRESSION: CHF.   Original Report Authenticated By: Rudie Meyer, M.D.    MDM  61 yo AAM with history of CHF, last EF 20-25%, CKD, gout and HTN, presents with worsening SOB, chest pressure, orthopnea and abdominal swelling. Afebrile, vital signs stable. Presentation c/w CHF exacerbation as BNP >11K, edema on CHF, volume overloaded on exam. Doubt ACS as troponin normal, ECG without acute changes. Mild tachycardia to low 100's. No evidence of PNA. Labs: Hgb stable at 12.1, Cr stable at 2, no other significant abnormalities. INR is 1, with patient's history of atrial fibrillation may require anticoagulation, this was deferred to  cardiology. He was treated with 80 mg of lasix. Admitted to Cardiology in stable condition.   Reviewed imaging, ECG, labs and previous medical records, utilized in MDM  Discussed case with Dr. Silverio Lay  ECG: atrial fibrillation, rate 103, LAD, inverted T waves in lateral leads. When compared to ECG from 11/2011, no significant change found.   Clinical Impression 1. Atrial fibrillation with RVR 2. Acute on chronic systolic heart failure 3. Subtherapeutic INR 4. Chronic kidney disease        Margie Billet, MD 12/10/11 [phone]

## 2011-12-09 NOTE — Progress Notes (Signed)
ANTICOAGULATION CONSULT NOTE - Initial Consult  Pharmacy Consult for heparin, MD dosing coumadin Indication: atrial fibrillation  No Known Allergies  Patient Measurements: Height: 6' 3.5" (191.8 cm) Weight: 229 lb (103.874 kg) IBW/kg (Calculated) : 85.65    Vital Signs: Temp: 98.1 F (36.7 C) (12/07 1445) BP: 134/95 mmHg (12/07 1810) Pulse Rate: 106  (12/07 1810)  Labs:  Basename 12/09/11 1701 12/09/11 1650  HGB -- 12.1*  HCT -- 37.1*  PLT -- 220  APTT -- --  LABPROT 13.1 --  INR 1.00 --  HEPARINUNFRC -- --  CREATININE -- 2.02*  CKTOTAL -- --  CKMB -- --  TROPONINI -- <0.30    Estimated Creatinine Clearance: 50.5 ml/min (by C-G formula based on Cr of 2.02).   Medical History: Past Medical History  Diagnosis Date  . CHF (congestive heart failure)   . Sarcoidosis   . Cardiomyopathy, dilated, nonischemic     non ischemic by cath  . Acute on chronic systolic heart failure   . Automatic implantable cardiac defibrillator in situ   . Atrial fibrillation   . NSVT (nonsustained ventricular tachycardia)   . GERD (gastroesophageal reflux disease)   . Hypercholesteremia   . Myocardial infarction   . Shortness of breath   . Chronic kidney disease (CKD), stage III (moderate)   . Pacemaker   . Anginal pain   . Gout   . Hypertension     [name]  . Coronary artery disease     Medications:  On coumadin 7.5 mg daily at home - last dose 12/08/11  Assessment: [name] is a 61 yo man admitted with chest pain and SOB and gout flare.  He is on coumadin PTA for a fib.  He is currently in afib with rapid ventricular response.  His home dose is 7.5 mg daily with last dose 12/6 per med history. His admission INR is 1.0 - so compliance is questionable but he has reported recent diarrhea and vomiting.  CBC is OK with no bleeding reported.   Goal of Therapy:  Heparin level 0.3-0.7 units/ml Monitor platelets by anticoagulation protocol: Yes   Plan:  Give 4000 units  bolus x 1 Start heparin infusion at 1450 units/hr Check anti-Xa level in 6 hours and daily while on heparin Continue to monitor H&H and platelets Herby Abraham, Pharm.D. [phone] 12/09/2011 7:48 PM

## 2011-12-10 LAB — CBC
HCT: 37.1 % — ABNORMAL LOW (ref 39.0–52.0)
MCH: 30.1 pg (ref 26.0–34.0)
MCHC: 33.4 g/dL (ref 30.0–36.0)
MCV: 90 fL (ref 78.0–100.0)
RDW: 14.8 % (ref 11.5–15.5)

## 2011-12-10 LAB — BASIC METABOLIC PANEL
BUN: 22 mg/dL (ref 6–23)
Calcium: 9.3 mg/dL (ref 8.4–10.5)
GFR calc non Af Amer: 34 mL/min — ABNORMAL LOW (ref >90)
Glucose, Bld: 94 mg/dL (ref 70–99)
Sodium: 140 mEq/L (ref 135–145)

## 2011-12-10 LAB — TSH: TSH: 5.053 u[IU]/mL — ABNORMAL HIGH (ref 0.350–4.500)

## 2011-12-10 MED ORDER — POTASSIUM CHLORIDE CRYS ER 20 MEQ PO TBCR
20.0000 meq | EXTENDED_RELEASE_TABLET | Freq: Two times a day (BID) | ORAL | Status: AC
  Administered 2011-12-10 – 2011-12-12 (×6): 20 meq via ORAL
  Filled 2011-12-10 (×7): qty 1

## 2011-12-10 MED ORDER — WARFARIN SODIUM 10 MG PO TABS
10.0000 mg | ORAL_TABLET | Freq: Once | ORAL | Status: AC
  Administered 2011-12-10: 10 mg via ORAL
  Filled 2011-12-10: qty 1

## 2011-12-10 NOTE — Progress Notes (Signed)
Subjective:  Feeling better but still short of breath.  Objective:  Vital Signs in the last 24 hours: Temp:  [97.4 F (36.3 C)-98.2 F (36.8 C)] 98.2 F (36.8 C) (12/08 0630) Pulse Rate:  [92-116] 100  (12/08 0630) Cardiac Rhythm:  [-] Atrial fibrillation (12/07 2035) Resp:  [18-42] 18  (12/08 0630) BP: (115-151)/(88-115) 115/92 mmHg (12/08 0630) SpO2:  [93 %-100 %] 94 % (12/08 0630) Weight:  [103.5 kg (228 lb 2.8 oz)-104.645 kg (230 lb 11.2 oz)] 103.5 kg (228 lb 2.8 oz) (12/08 0630)  Physical Exam: BP Readings from Last 1 Encounters:  12/10/11 115/92    Wt Readings from Last 1 Encounters:  12/10/11 103.5 kg (228 lb 2.8 oz)    Weight change:   HEENT: [place]/AT, Eyes-Brown, PERL, EOMI, Conjunctiva-Pink, Sclera-Non-icteric Neck: + JVD, No bruit, Trachea midline. Lungs:  Clearing, Bilateral. Cardiac:  Regular rhythm, normal S1 and S2, no S3.  Abdomen:  Soft, non-tender. Extremities:  No edema present. No cyanosis. No clubbing. CNS: AxOx3, Cranial nerves grossly intact, moves all 4 extremities. Right handed. Skin: Warm and dry.   Intake/Output from previous day: 12/07 0701 - 12/08 0700 In: 682.1 [P.O.:480; I.V.:194.1; IV Piggyback:8] Out: 3175 [Urine:3175]    Lab Results: BMET    Component Value Date/Time   NA 140 12/10/2011 0610   K 3.8 12/10/2011 0610   CL 107 12/10/2011 0610   CO2 22 12/10/2011 0610   GLUCOSE 94 12/10/2011 0610   BUN 22 12/10/2011 0610   CREATININE 2.03* 12/10/2011 0610   CALCIUM 9.3 12/10/2011 0610   GFRNONAA 34* 12/10/2011 0610   GFRAA 39* 12/10/2011 0610   CBC    Component Value Date/Time   WBC 7.6 12/10/2011 0610   RBC 4.12* 12/10/2011 0610   HGB 12.4* 12/10/2011 0610   HCT 37.1* 12/10/2011 0610   PLT 212 12/10/2011 0610   MCV 90.0 12/10/2011 0610   MCH 30.1 12/10/2011 0610   MCHC 33.4 12/10/2011 0610   RDW 14.8 12/10/2011 0610   LYMPHSABS 2.0 12/09/2011 1650   MONOABS 0.9 12/09/2011 1650   EOSABS 0.2 12/09/2011 1650   BASOSABS 0.0 12/09/2011 1650    CARDIAC ENZYMES Lab Results  Component Value Date   CKTOTAL 50 10/10/2011   CKMB 2.6 10/10/2011   TROPONINI <0.30 12/09/2011    Scheduled Meds:   . amiodarone  200 mg Oral Daily  . [COMPLETED] aspirin  324 mg Oral Once  . atorvastatin  20 mg Oral q1800  . colchicine  0.6 mg Oral BID  . digoxin  0.25 mg Oral Daily  . [COMPLETED] furosemide  80 mg Intravenous Once  . furosemide  80 mg Intravenous Q12H  . [COMPLETED] heparin  4,000 Units Intravenous Once  . pantoprazole  40 mg Oral Q1200  . potassium chloride  20 mEq Oral BID  . sodium chloride  3 mL Intravenous Q12H  . warfarin  10 mg Oral ONCE-1800  . Warfarin - Physician Dosing Inpatient   Does not apply q1800  . [DISCONTINUED] warfarin  7.5 mg Oral q1800   Continuous Infusions:   . heparin 1,450 Units/hr (12/09/11 2307)   PRN Meds:.sodium chloride, acetaminophen, ALPRAZolam, antiseptic oral rinse, ondansetron (ZOFRAN) IV, oxyCODONE-acetaminophen, sodium chloride, zolpidem  Assessment/Plan:  Patient Active Hospital Problem List:  A. Fibrillation with rapid ventricular response  Acute left heart systolic failure  Hypertension  CKD, III  Gout  CAD  Chest pain  S/P ICD  Dilated cardiomyopathy  Continue diuresis   LOS: 1 day  Orpah Cobb  MD  12/10/2011, 11:17 AM

## 2011-12-10 NOTE — Progress Notes (Signed)
ANTICOAGULATION CONSULT NOTE - follow up  Pharmacy Consult for heparin, MD dosing coumadin Indication: atrial fibrillation  No Known Allergies  Patient Measurements: Height: 6' 3.5" (191.8 cm) Weight: 228 lb 2.8 oz (103.5 kg) IBW/kg (Calculated) : 85.65    Vital Signs: Temp: 98.2 F (36.8 C) (12/08 0630) Temp src: Oral (12/08 0630) BP: 115/92 mmHg (12/08 0630) Pulse Rate: 100  (12/08 0630)  Labs:  Basename 12/10/11 1120 12/10/11 0610 12/10/11 0530 12/09/11 1701 12/09/11 1650  HGB -- 12.4* -- -- 12.1*  HCT -- 37.1* -- -- 37.1*  PLT -- 212 -- -- 220  APTT -- -- -- -- --  LABPROT -- 14.2 -- 13.1 --  INR -- 1.11 -- 1.00 --  HEPARINUNFRC 0.38 -- 0.34 -- --  CREATININE -- 2.03* -- -- 2.02*  CKTOTAL -- -- -- -- --  CKMB -- -- -- -- --  TROPONINI -- -- -- -- <0.30    Estimated Creatinine Clearance: 50.2 ml/min (by C-G formula based on Cr of 2.03).   Assessment: [name] is a 61 yo man admitted with chest pain and SOB and gout flare.  He is on coumadin PTA for a fib.  On admission he was in afib with rapid ventricular response.  His home dose is 7.5 mg daily with last dose 12/6 per med history. His admission INR is 1.0 - so compliance is questionable but he has reported recent diarrhea and vomiting.  Baseline CBC is OK with no bleeding reported.  His repeat heparin level is 0.38 on 1450 units/hr which remains therapeutic.  His INR this am is 1.11 after 7.5 mg coumadin given yesterday.   Goal of Therapy:  Heparin level 0.3-0.7 units/ml Monitor platelets by anticoagulation protocol: Yes   Plan:  1. Continue heparin drip at 1450 units/hr 2. Coumadin per MD is 10 mg x 1 dose today 3. Daily HL, CBC, INR Herby Abraham, Pharm.D. [phone] 12/10/2011 1:06 PM

## 2011-12-10 NOTE — Progress Notes (Signed)
  Echocardiogram 2D Echocardiogram has been performed.  Cathie Beams 12/10/2011, 1:56 PM

## 2011-12-10 NOTE — Progress Notes (Signed)
12/09/11 2035  Clinical Encounter Type  Visited With Patient  Visit Type Initial  Referral From Nurse  Advance Directives (For Healthcare)  Advance Directive Patient does not have advance directive  Pre-existing out of facility DNR order (yellow form or pink MOST form) No   Visited and prayed with the patient.  Veryl Speak

## 2011-12-10 NOTE — Progress Notes (Signed)
ANTICOAGULATION CONSULT NOTE - Follow Up Consult  Pharmacy Consult for heparin Indication: atrial fibrillation  Labs:  Basename 12/10/11 0610 12/10/11 0530 12/09/11 1701 12/09/11 1650  HGB 12.4* -- -- 12.1*  HCT 37.1* -- -- 37.1*  PLT 212 -- -- 220  APTT -- -- -- --  LABPROT 14.2 -- 13.1 --  INR 1.11 -- 1.00 --  HEPARINUNFRC -- 0.34 -- --  CREATININE 2.03* -- -- 2.02*  CKTOTAL -- -- -- --  CKMB -- -- -- --  TROPONINI -- -- -- <0.30    Assessment/Plan:  61yo male therapeutic on heparin with initial dosing for Afib.  Will continue gtt at current rate and confirm stable with additional level.  Colleen Can PharmD BCPS 12/10/2011,7:56 AM

## 2011-12-10 NOTE — Progress Notes (Signed)
Pt. Alarmed 9 beats v-tach.  Pt. Asymptomatic awake in bed.  VSS. Dr. Algie Coffer made aware.  Will continue to monitor patient.

## 2011-12-10 NOTE — Progress Notes (Signed)
Pt. Informed me around 1700 that he was having mid chest discomfort continuing since admission. He said the discomfort made it more difficult to breathe but this eased when the pain eased. Vital signs were taken. Pt. Refused nitroglycerin. Dr. Algie Coffer was made aware. He ordered EKG and cardiac panel which were done. Dr. Algie Coffer also came to see the pt. Will continue to monitor. Caralyn Twining, Melida Quitter

## 2011-12-10 NOTE — ED Provider Notes (Signed)
I have supervised the resident on the management of this patient and agree with the note above. I personally interviewed and examined the patient and my addendum is below.   [name] is a 61 y.o. male hx of CHF with EF 20-25%, CKD here with SOB. SOB worse at night, + abdominal swelling as well over last 6 days. Exam showed + JVD, bilateral crackles and 2+ edema bil legs. Labs stable, BNP >11,000. He was given IV lasix and admitted to cardiology for CHF exacerbation.    Richardean Canal, MD 12/10/11 [phone]

## 2011-12-11 LAB — CBC
Hemoglobin: 13.9 g/dL (ref 13.0–17.0)
MCH: 30.5 pg (ref 26.0–34.0)
MCV: 89 fL (ref 78.0–100.0)
RBC: 4.55 MIL/uL (ref 4.22–5.81)

## 2011-12-11 LAB — BASIC METABOLIC PANEL
BUN: 24 mg/dL — ABNORMAL HIGH (ref 6–23)
Chloride: 101 mEq/L (ref 96–112)
Glucose, Bld: 83 mg/dL (ref 70–99)
Potassium: 3.8 mEq/L (ref 3.5–5.1)

## 2011-12-11 LAB — TROPONIN I: Troponin I: <0.3 ng/mL (ref <0.30)

## 2011-12-11 MED ORDER — WARFARIN SODIUM 10 MG PO TABS
10.0000 mg | ORAL_TABLET | Freq: Once | ORAL | Status: AC
  Administered 2011-12-11: 10 mg via ORAL
  Filled 2011-12-11: qty 1

## 2011-12-11 MED ORDER — LOPERAMIDE HCL 2 MG PO CAPS: 4.0000 mg | ORAL_CAPSULE | ORAL | Status: DC | PRN

## 2011-12-11 MED ORDER — LOPERAMIDE HCL 2 MG PO CAPS
2.0000 mg | ORAL_CAPSULE | ORAL | Status: DC | PRN
  Administered 2011-12-13: 2 mg via ORAL
  Filled 2011-12-11 (×2): qty 1

## 2011-12-11 MED ORDER — LOPERAMIDE HCL 2 MG PO CAPS
4.0000 mg | ORAL_CAPSULE | Freq: Once | ORAL | Status: AC
  Administered 2011-12-11: 4 mg via ORAL
  Filled 2011-12-11: qty 2

## 2011-12-11 MED ORDER — WARFARIN - PHARMACIST DOSING INPATIENT: Freq: Every day | Status: DC

## 2011-12-11 NOTE — Progress Notes (Signed)
Chaplain visited patient after receiving a referral. Patient and family were in the room at the time of visit. Chaplain provided ministry of presence and empathic listening. Chaplain shared words of hope and encouragement with patient and family member. Chaplain will continue to provide spiritual care to patient and family as needed. Patient and family expressed appreciation for Chaplain's visit.

## 2011-12-11 NOTE — Progress Notes (Addendum)
  ANTICOAGULATION CONSULT NOTE - Follow Up Consult  Pharmacy Consult for Heparin Indication: atrial fibrillation  No Known Allergies  Patient Measurements: Height: 6' 3.5" (191.8 cm) Weight: 223 lb 12.8 oz (101.515 kg) (b scale) IBW/kg (Calculated) : 85.65  Heparin Dosing Weight: 101.5kg  Vital Signs: Temp: 98.8 F (37.1 C) (12/09 0657) Temp src: Oral (12/09 0657) BP: 128/87 mmHg (12/09 0657) Pulse Rate: 96  (12/09 0922)  Labs:  Basename 12/11/11 0615 12/11/11 0030 12/10/11 1738 12/10/11 1120 12/10/11 0610 12/10/11 0530 12/09/11 1701 12/09/11 1650  HGB 13.9 -- -- -- 12.4* -- -- --  HCT 40.5 -- -- -- 37.1* -- -- 37.1*  PLT 233 -- -- -- 212 -- -- 220  APTT -- -- -- -- -- -- -- --  LABPROT 14.8 -- -- -- 14.2 -- 13.1 --  INR 1.18 -- -- -- 1.11 -- 1.00 --  HEPARINUNFRC 0.44 -- -- 0.38 -- 0.34 -- --  CREATININE 2.12* -- -- -- 2.03* -- -- 2.02*  CKTOTAL -- -- -- -- -- -- -- --  CKMB -- -- -- -- -- -- -- --  TROPONINI -- <0.30 <0.30 -- -- -- -- <0.30    Estimated Creatinine Clearance: 44.4 ml/min (by C-G formula based on Cr of 2.12).   Medications:  Heparin 1450 units/hr  Assessment: 61yom continuing on Heparin bridging to Coumadin for Afib. Heparin level (0.44) remains therapeutic on 1450 units/hr. INR (1.18) is subtherapeutic and is slowly trending up. MD is managing Coumadin dosing. - H/H and Plts improving - No significant bleeding reported  Goal of Therapy:  INR 2-3 Heparin level 0.3-0.7 units/ml Monitor platelets by anticoagulation protocol: Yes   Plan:  1. Continue Heparin 1450 units/hr (14.5 m/hr) 2. Follow-up MD Coumadin dose - no orders yet 3. Follow-up AM INR, Heparin level and CBC  [name] [phone] 12/11/2011,9:30 AM   Addendum: Pharmacy now consulted to manage Coumadin dosing.   Plan: 1. Repeat Coumadin 10mg  po x 1 today 2. Follow-up AM INR  [name], PharmD 12/11/2011 1:13 PM

## 2011-12-11 NOTE — Progress Notes (Signed)
Subjective:  Denies any anginal chest pain. Complains of mild shortness of breath and vague abdominal pain diarrhea improved  Objective:  Vital Signs in the last 24 hours: Temp:  [98.5 F (36.9 C)-99.4 F (37.4 C)] 98.8 F (37.1 C) (12/09 0657) Pulse Rate:  [63-96] 96  (12/09 0922) Resp:  [18] 18  (12/09 0657) BP: (106-135)/(80-91) 128/87 mmHg (12/09 0657) SpO2:  [93 %-99 %] 99 % (12/09 0657) Weight:  [101.515 kg (223 lb 12.8 oz)] 101.515 kg (223 lb 12.8 oz) (12/09 0657)  Intake/Output from previous day: 12/08 0701 - 12/09 0700 In: 1204.1 [P.O.:960; I.V.:236.1; IV Piggyback:8] Out: 4375 [Urine:4375] Intake/Output from this shift: Total I/O In: 240 [P.O.:240] Out: 825 [Urine:825]  Physical Exam: Neck: no adenopathy, no carotid bruit, no JVD and supple, symmetrical, trachea midline Lungs: Decrease vessel at bases Heart: irregularly irregular rhythm, S1, S2 normal and Soft systolic murmur and S3 gallop noted Abdomen: soft, non-tender; bowel sounds normal; no masses,  no organomegaly Extremities: extremities normal, atraumatic, no cyanosis or edema  Lab Results:  [facility] 12/11/11 0615 12/10/11 0610  WBC 7.0 7.6  HGB 13.9 12.4*  PLT 233 212    Basename 12/11/11 0615 12/10/11 0610  NA 138 140  K 3.8 3.8  CL 101 107  CO2 25 22  GLUCOSE 83 94  BUN 24* 22  CREATININE 2.12* 2.03*    Basename 12/11/11 0030 12/10/11 1738  TROPONINI <0.30 <0.30   Hepatic Function Panel No results found for this basename: PROT,ALBUMIN,AST,ALT,ALKPHOS,BILITOT,BILIDIR,IBILI in the last 72 hours No results found for this basename: CHOL in the last 72 hours No results found for this basename: PROTIME in the last 72 hours  Imaging: Imaging results have been reviewed and Dg Chest 2 View  12/09/2011  *RADIOLOGY REPORT*  Clinical Data: Shortness of breath.  CHEST - 2 VIEW  Comparison: 11/13/2011.  Findings: The heart is borderline enlarged.  The right ventricular pacer wire / AICD is unchanged.   There is vascular congestion and pulmonary edema consistent with CHF.  No definite pleural effusions.  IMPRESSION: CHF.   Original Report Authenticated By: Rudie Meyer, M.D.     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  LOS: 2 days    [name] N 12/11/2011, 12:18 PM

## 2011-12-12 LAB — BASIC METABOLIC PANEL
CO2: 22 mEq/L (ref 19–32)
Chloride: 99 mEq/L (ref 96–112)
Sodium: 136 mEq/L (ref 135–145)

## 2011-12-12 LAB — CBC
HCT: 45.5 % (ref 39.0–52.0)
Hemoglobin: 15.6 g/dL (ref 13.0–17.0)
RDW: 14.7 % (ref 11.5–15.5)
WBC: 5.8 10*3/uL (ref 4.0–10.5)

## 2011-12-12 LAB — HEPARIN LEVEL (UNFRACTIONATED): Heparin Unfractionated: 0.72 IU/mL — ABNORMAL HIGH (ref 0.30–0.70)

## 2011-12-12 LAB — PROTIME-INR: INR: 1.32 (ref 0.00–1.49)

## 2011-12-12 LAB — MAGNESIUM: Magnesium: 2 mg/dL (ref 1.5–2.5)

## 2011-12-12 LAB — CLOSTRIDIUM DIFFICILE BY PCR: Toxigenic C. Difficile by PCR: NEGATIVE

## 2011-12-12 MED ORDER — CARVEDILOL 3.125 MG PO TABS
3.1250 mg | ORAL_TABLET | Freq: Two times a day (BID) | ORAL | Status: DC
  Administered 2011-12-12 – 2011-12-15 (×8): 3.125 mg via ORAL
  Filled 2011-12-12 (×9): qty 1

## 2011-12-12 MED ORDER — WARFARIN SODIUM 10 MG PO TABS
10.0000 mg | ORAL_TABLET | Freq: Once | ORAL | Status: AC
  Administered 2011-12-12: 10 mg via ORAL
  Filled 2011-12-12: qty 1

## 2011-12-12 MED ORDER — HYDRALAZINE HCL 10 MG PO TABS
10.0000 mg | ORAL_TABLET | Freq: Three times a day (TID) | ORAL | Status: DC
  Administered 2011-12-12 – 2011-12-15 (×7): 10 mg via ORAL
  Filled 2011-12-12 (×12): qty 1

## 2011-12-12 NOTE — Progress Notes (Signed)
ANTICOAGULATION CONSULT NOTE - Follow Up Consult  Pharmacy Consult for Heparin and Coumadin Indication: atrial fibrillation  No Known Allergies  Patient Measurements: Height: 6' 3.5" (191.8 cm) Weight: 214 lb 6.4 oz (97.251 kg) (b scale; pt reweighed twiice) IBW/kg (Calculated) : 85.65  Heparin Dosing Weight: 97.3 kg  Vital Signs: Temp: 97.2 F (36.2 C) (12/10 0555) Temp src: Oral (12/10 0555) BP: 110/77 mmHg (12/10 0555) Pulse Rate: 78  (12/10 0555)  Labs:  Basename 12/12/11 4098 12/11/11 0615 12/11/11 0030 12/10/11 1738 12/10/11 1120 12/10/11 0610 12/09/11 1650  HGB 15.6 13.9 -- -- -- -- --  HCT 45.5 40.5 -- -- -- 37.1* --  PLT 256 233 -- -- -- 212 --  APTT -- -- -- -- -- -- --  LABPROT 16.1* 14.8 -- -- -- 14.2 --  INR 1.32 1.18 -- -- -- 1.11 --  HEPARINUNFRC 0.72* 0.44 -- -- 0.38 -- --  CREATININE 2.42* 2.12* -- -- -- 2.03* --  CKTOTAL -- -- -- -- -- -- --  CKMB -- -- -- -- -- -- --  TROPONINI -- -- <0.30 <0.30 -- -- <0.30    Estimated Creatinine Clearance: 38.9 ml/min (by C-G formula based on Cr of 2.42).   Medications:  Heparin 1450 units/hr  Assessment: 61yom on heparin bridging to Coumadin for Afib. Heparin level (0.72) has increased to just above goal range - will decrease rate and follow-up AM level. INR (1.32) is subtherapeutic but is trending up. Patient reports PTA Coumadin regimen was 7.5mg  daily. - H/H and Plts improving - No significant bleeding reported  Goal of Therapy:  INR 2-3 Heparin level 0.3-0.7 units/ml Monitor platelets by anticoagulation protocol: Yes   Plan:  1. Decrease heparin drip to 1350 units/hr (13.5 ml/hr) 2. Repeat Coumadin 10mg  po x 1 today 3. Follow-up AM INR, heparin level and CBC 4. Patient's TSH is 5.053 - follow-up further labs (T4) or therapy initiation  [name] [phone] 12/12/2011,8:33 AM

## 2011-12-12 NOTE — Progress Notes (Signed)
Subjective:  Patient denies any chest pain complains of shortness of breath with minimal exertion. Denies any palpitations had 9 beats  of nonsustained VT on the monitor asymptomatic  Objective:  Vital Signs in the last 24 hours: Temp:  [97.2 F (36.2 C)-98.4 F (36.9 C)] 97.2 F (36.2 C) (12/10 0555) Pulse Rate:  [78-105] 78  (12/10 0555) Resp:  [16-20] 17  (12/10 0555) BP: (110-118)/(77-87) 110/77 mmHg (12/10 0555) SpO2:  [94 %-97 %] 97 % (12/10 0555) Weight:  [97.251 kg (214 lb 6.4 oz)] 97.251 kg (214 lb 6.4 oz) (12/10 0555)  Intake/Output from previous day: 12/09 0701 - 12/10 0700 In: 1325.9 [P.O.:960; I.V.:365.9] Out: 825 [Urine:825] Intake/Output from this shift: Total I/O In: 480 [P.O.:480] Out: 250 [Urine:250]  Physical Exam: Neck: no adenopathy, no carotid bruit, no JVD and supple, symmetrical, trachea midline Lungs: Decreased breath sound at bases Heart: irregularly irregular rhythm, S1, S2 normal and Soft systolic murmur noted and S3 gallop noted Abdomen: soft, non-tender; bowel sounds normal; no masses,  no organomegaly Extremities: extremities normal, atraumatic, no cyanosis or edema  Lab Results:  [facility] 12/12/11 0614 12/11/11 0615  WBC 5.8 7.0  HGB 15.6 13.9  PLT 256 233    Basename 12/12/11 0614 12/11/11 0615  NA 136 138  K 4.4 3.8  CL 99 101  CO2 22 25  GLUCOSE 101* 83  BUN 31* 24*  CREATININE 2.42* 2.12*    Basename 12/11/11 0030 12/10/11 1738  TROPONINI <0.30 <0.30   Hepatic Function Panel No results found for this basename: PROT,ALBUMIN,AST,ALT,ALKPHOS,BILITOT,BILIDIR,IBILI in the last 72 hours No results found for this basename: CHOL in the last 72 hours No results found for this basename: PROTIME in the last 72 hours  Imaging: Imaging results have been reviewed and No results found.  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

## 2011-12-12 NOTE — Progress Notes (Signed)
Pt asked for visit and remembered previous visits. He explained why he was here and had a recent loss of a friend who was like his mother. Pt thanked me for coming and asked for a return visit.  Marjory Lies Chaplain

## 2011-12-12 NOTE — Progress Notes (Signed)
Pt BP = 93/69 and 91/65.  MD notified with callback, verbal order to hold 10 mg Hydralazine.  Verbal order stating ok to give scheduled Coreg, Amiodarone and Digoxin.  Will con't to monitor and will notify MD of any changes.

## 2011-12-13 LAB — CBC
MCHC: 34.9 g/dL (ref 30.0–36.0)
RDW: 14.2 % (ref 11.5–15.5)
WBC: 4.8 10*3/uL (ref 4.0–10.5)

## 2011-12-13 LAB — PROTIME-INR
INR: 1.84 — ABNORMAL HIGH (ref 0.00–1.49)
Prothrombin Time: 20.6 seconds — ABNORMAL HIGH (ref 11.6–15.2)

## 2011-12-13 LAB — HEPARIN LEVEL (UNFRACTIONATED): Heparin Unfractionated: 0.57 IU/mL (ref 0.30–0.70)

## 2011-12-13 MED ORDER — SUCRALFATE 1 G PO TABS
1.0000 g | ORAL_TABLET | Freq: Four times a day (QID) | ORAL | Status: DC
  Administered 2011-12-13: 1 g via ORAL
  Filled 2011-12-13 (×11): qty 1

## 2011-12-13 MED ORDER — FUROSEMIDE 80 MG PO TABS
80.0000 mg | ORAL_TABLET | Freq: Two times a day (BID) | ORAL | Status: DC
Start: 2011-12-13 — End: 2011-12-14
  Administered 2011-12-13 – 2011-12-14 (×2): 80 mg via ORAL
  Filled 2011-12-13 (×4): qty 1

## 2011-12-13 MED ORDER — WARFARIN SODIUM 7.5 MG PO TABS
7.5000 mg | ORAL_TABLET | Freq: Once | ORAL | Status: AC
  Administered 2011-12-13: 7.5 mg via ORAL
  Filled 2011-12-13: qty 1

## 2011-12-13 NOTE — Progress Notes (Signed)
Pt was in fetal position when I arrived. He said he didn't feel well and that his stomach was burning. He wanted me to pray for him, which I did. He thanked me for the visit and ckng on him.  Marjory Lies Chaplain

## 2011-12-13 NOTE — Progress Notes (Signed)
Utilization Review Completed.   Hadlyn Amero, RN, BSN Nurse Case Manager  [phone]  

## 2011-12-13 NOTE — Progress Notes (Signed)
Subjective:  Patient denies any chest pain states breathing is improved overall feels better  Objective:  Vital Signs in the last 24 hours: Temp:  [97.1 F (36.2 C)-97.8 F (36.6 C)] 97.1 F (36.2 C) (12/11 0511) Pulse Rate:  [67-119] 77  (12/11 0511) Resp:  [18-19] 18  (12/11 0511) BP: (96-114)/(60-73) 108/73 mmHg (12/11 0511) SpO2:  [97 %-98 %] 98 % (12/11 0511) Weight:  [97 kg (213 lb 13.5 oz)] 97 kg (213 lb 13.5 oz) (12/11 0511)  Intake/Output from previous day: 12/10 0701 - 12/11 0700 In: 1878.2 [P.O.:1560; I.V.:314.2; IV Piggyback:4] Out: 1050 [Urine:1050] Intake/Output from this shift: Total I/O In: 180 [P.O.:180] Out: 700 [Urine:700]  Physical Exam: Neck: no adenopathy, no carotid bruit, no JVD and supple, symmetrical, trachea midline Lungs: clear to auscultation bilaterally Heart: irregularly irregular rhythm, S1, S2 normal and Soft systolic murmur noted Abdomen: soft, non-tender; bowel sounds normal; no masses,  no organomegaly Extremities: extremities normal, atraumatic, no cyanosis or edema  Lab Results:  [facility] 12/13/11 0550 12/12/11 0614  WBC 4.8 5.8  HGB 15.9 15.6  PLT 274 256    Basename 12/12/11 0614 12/11/11 0615  NA 136 138  K 4.4 3.8  CL 99 101  CO2 22 25  GLUCOSE 101* 83  BUN 31* 24*  CREATININE 2.42* 2.12*    Basename 12/11/11 0030 12/10/11 1738  TROPONINI <0.30 <0.30   Hepatic Function Panel No results found for this basename: PROT,ALBUMIN,AST,ALT,ALKPHOS,BILITOT,BILIDIR,IBILI in the last 72 hours No results found for this basename: CHOL in the last 72 hours No results found for this basename: PROTIME in the last 72 hours  Imaging: Imaging results have been reviewed and No results found.  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 hopefully discharge in a.m. once INR above 2   LOS: 4 days    [name] 12/13/2011, 12:31 PM

## 2011-12-13 NOTE — Progress Notes (Signed)
ANTICOAGULATION CONSULT NOTE - Follow Up Consult  Pharmacy Consult for Heparin and Coumadin Indication: atrial fibrillation  No Known Allergies  Patient Measurements: Height: 6' 3.5" (191.8 cm) Weight: 213 lb 13.5 oz (97 kg) (scale b) IBW/kg (Calculated) : 85.65  Heparin Dosing Weight: 97kg  Vital Signs: Temp: 97.1 F (36.2 C) (12/11 0511) Temp src: Oral (12/11 0511) BP: 108/73 mmHg (12/11 0511) Pulse Rate: 77  (12/11 0511)  Labs:  Basename 12/13/11 0550 12/12/11 0614 12/11/11 0615 12/11/11 0030 12/10/11 1738  HGB 15.9 15.6 -- -- --  HCT 45.5 45.5 40.5 -- --  PLT 274 256 233 -- --  APTT -- -- -- -- --  LABPROT 20.6* 16.1* 14.8 -- --  INR 1.84* 1.32 1.18 -- --  HEPARINUNFRC 0.57 0.72* 0.44 -- --  CREATININE -- 2.42* 2.12* -- --  CKTOTAL -- -- -- -- --  CKMB -- -- -- -- --  TROPONINI -- -- -- <0.30 <0.30    Estimated Creatinine Clearance: 38.9 ml/min (by C-G formula based on Cr of 2.42).   Medications:  Heparin 1350 units/hr  Assessment: 61yom on heparin bridging to Coumadin for Afib. Heparin level (0.57) is therapeutic - continue current rate. INR (1.84) is subtherapeutic but increased greater than desired with Coumadin 10mg  x 3 - will decrease dose and follow-up AM INR.  - H/H and Plts improving  - No significant bleeding reported  Goal of Therapy:  INR 2-3 Heparin level 0.3-0.7 units/ml Monitor platelets by anticoagulation protocol: Yes   Plan:  1. Continue heparin drip 1350 units/hr (13.5 ml/hr) 2. Coumadin 7.5mg  po x 1 today 3. Follow-up AM INR, heparin level and CBC 4. Patient's TSH is 5.053 - follow-up further labs (T4) or therapy initiation   [name] [phone] 12/13/2011,10:25 AM

## 2011-12-13 NOTE — Progress Notes (Signed)
Pt c/o of abdominal burning and pt stated his stool is white.  Dr. [name] notified and ordered Carafate 1 gram every 6 hours.  Will carry out MD orders and continue to monitor.

## 2011-12-14 LAB — CBC
MCH: 30.2 pg (ref 26.0–34.0)
MCHC: 34.2 g/dL (ref 30.0–36.0)
Platelets: 269 10*3/uL (ref 150–400)
RDW: 14.1 % (ref 11.5–15.5)

## 2011-12-14 LAB — BASIC METABOLIC PANEL
BUN: 44 mg/dL — ABNORMAL HIGH (ref 6–23)
Calcium: 10.1 mg/dL (ref 8.4–10.5)
Creatinine, Ser: 3.29 mg/dL — ABNORMAL HIGH (ref 0.50–1.35)
GFR calc Af Amer: 22 mL/min — ABNORMAL LOW (ref >90)
GFR calc non Af Amer: 19 mL/min — ABNORMAL LOW (ref >90)

## 2011-12-14 LAB — PROTIME-INR: Prothrombin Time: 24 seconds — ABNORMAL HIGH (ref 11.6–15.2)

## 2011-12-14 LAB — HEPARIN LEVEL (UNFRACTIONATED): Heparin Unfractionated: 0.88 IU/mL — ABNORMAL HIGH (ref 0.30–0.70)

## 2011-12-14 IMAGING — CR DG CHEST 2V
2 series · 2 of 2 positions shown · non-contrast
Comparison: 06/26/2010

CLINICAL DATA: CHF

CHEST - 2 VIEW

[w chest pa]
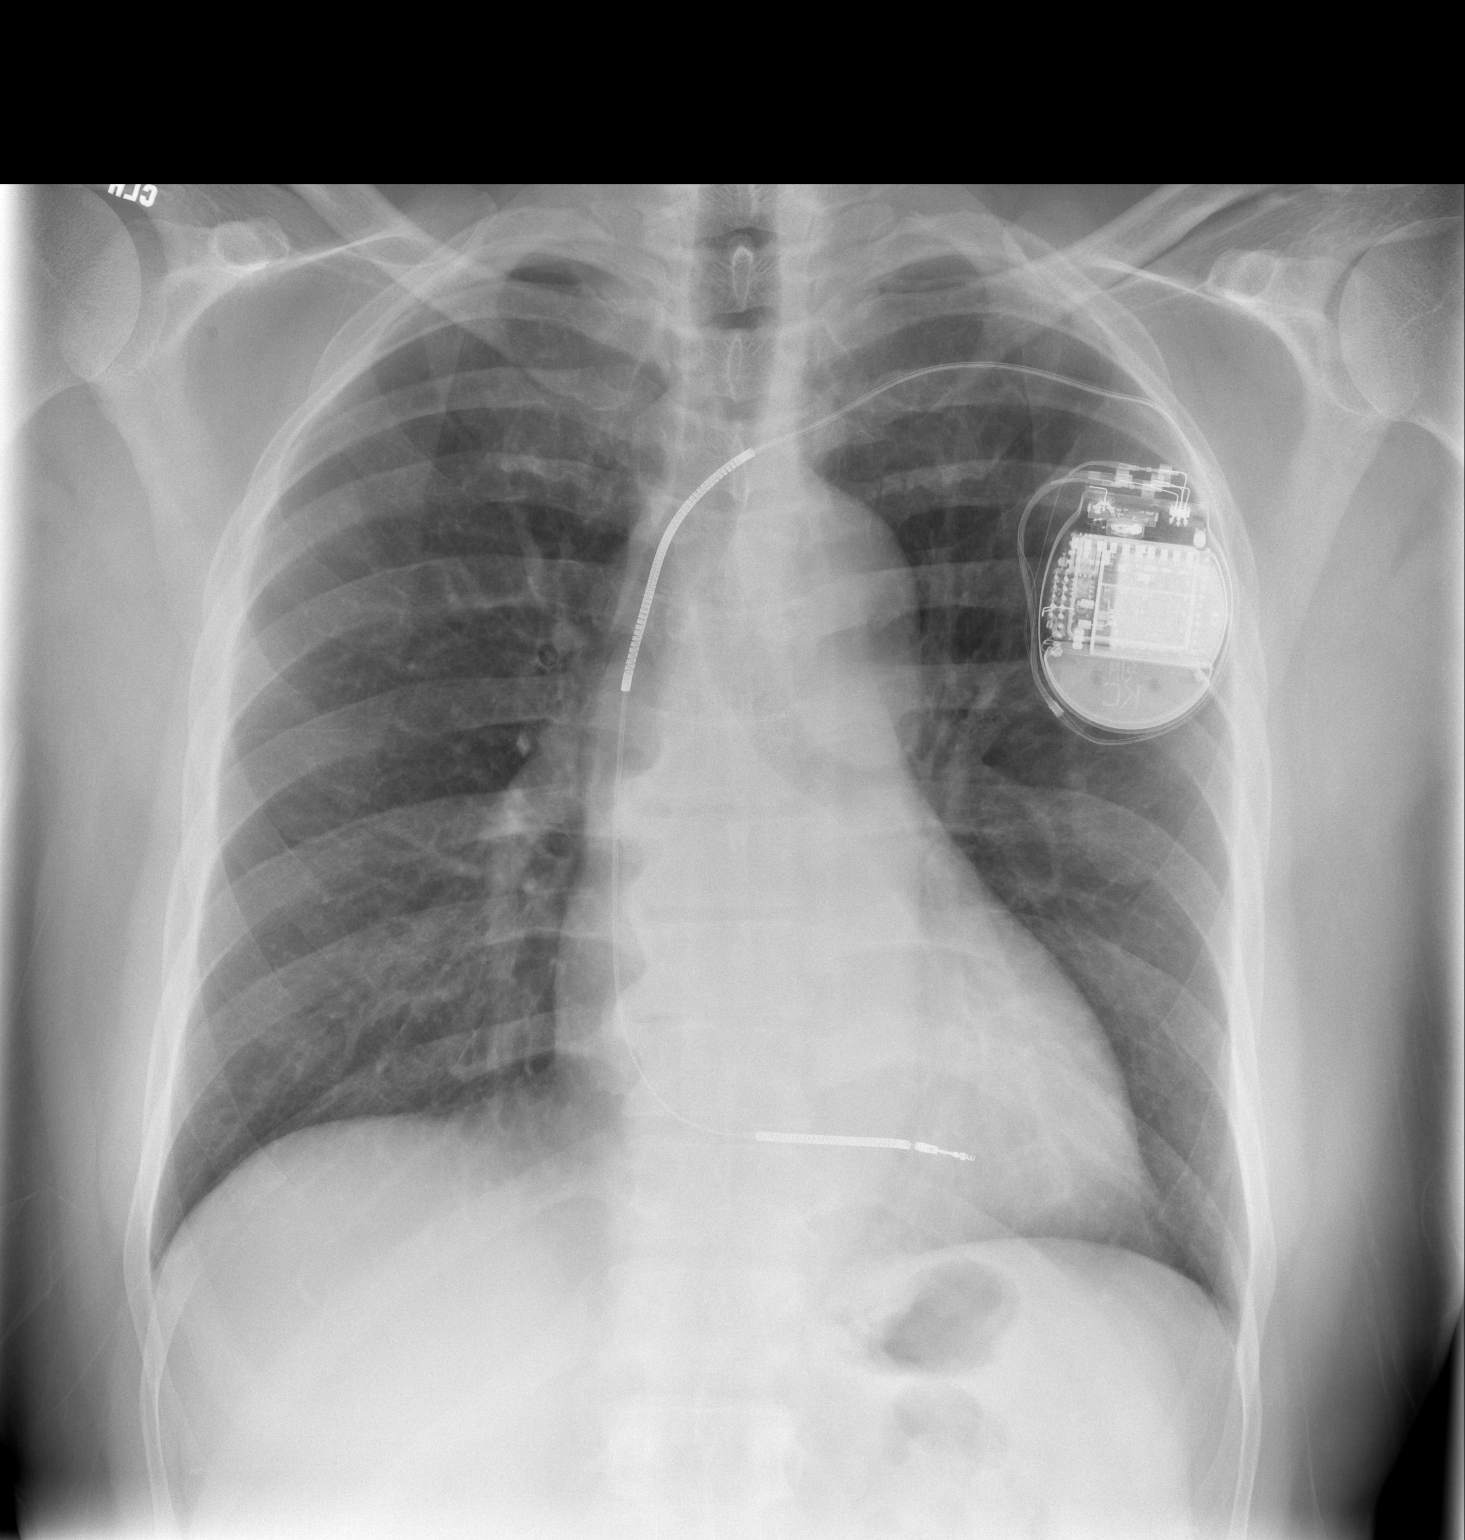

[w chest lat]
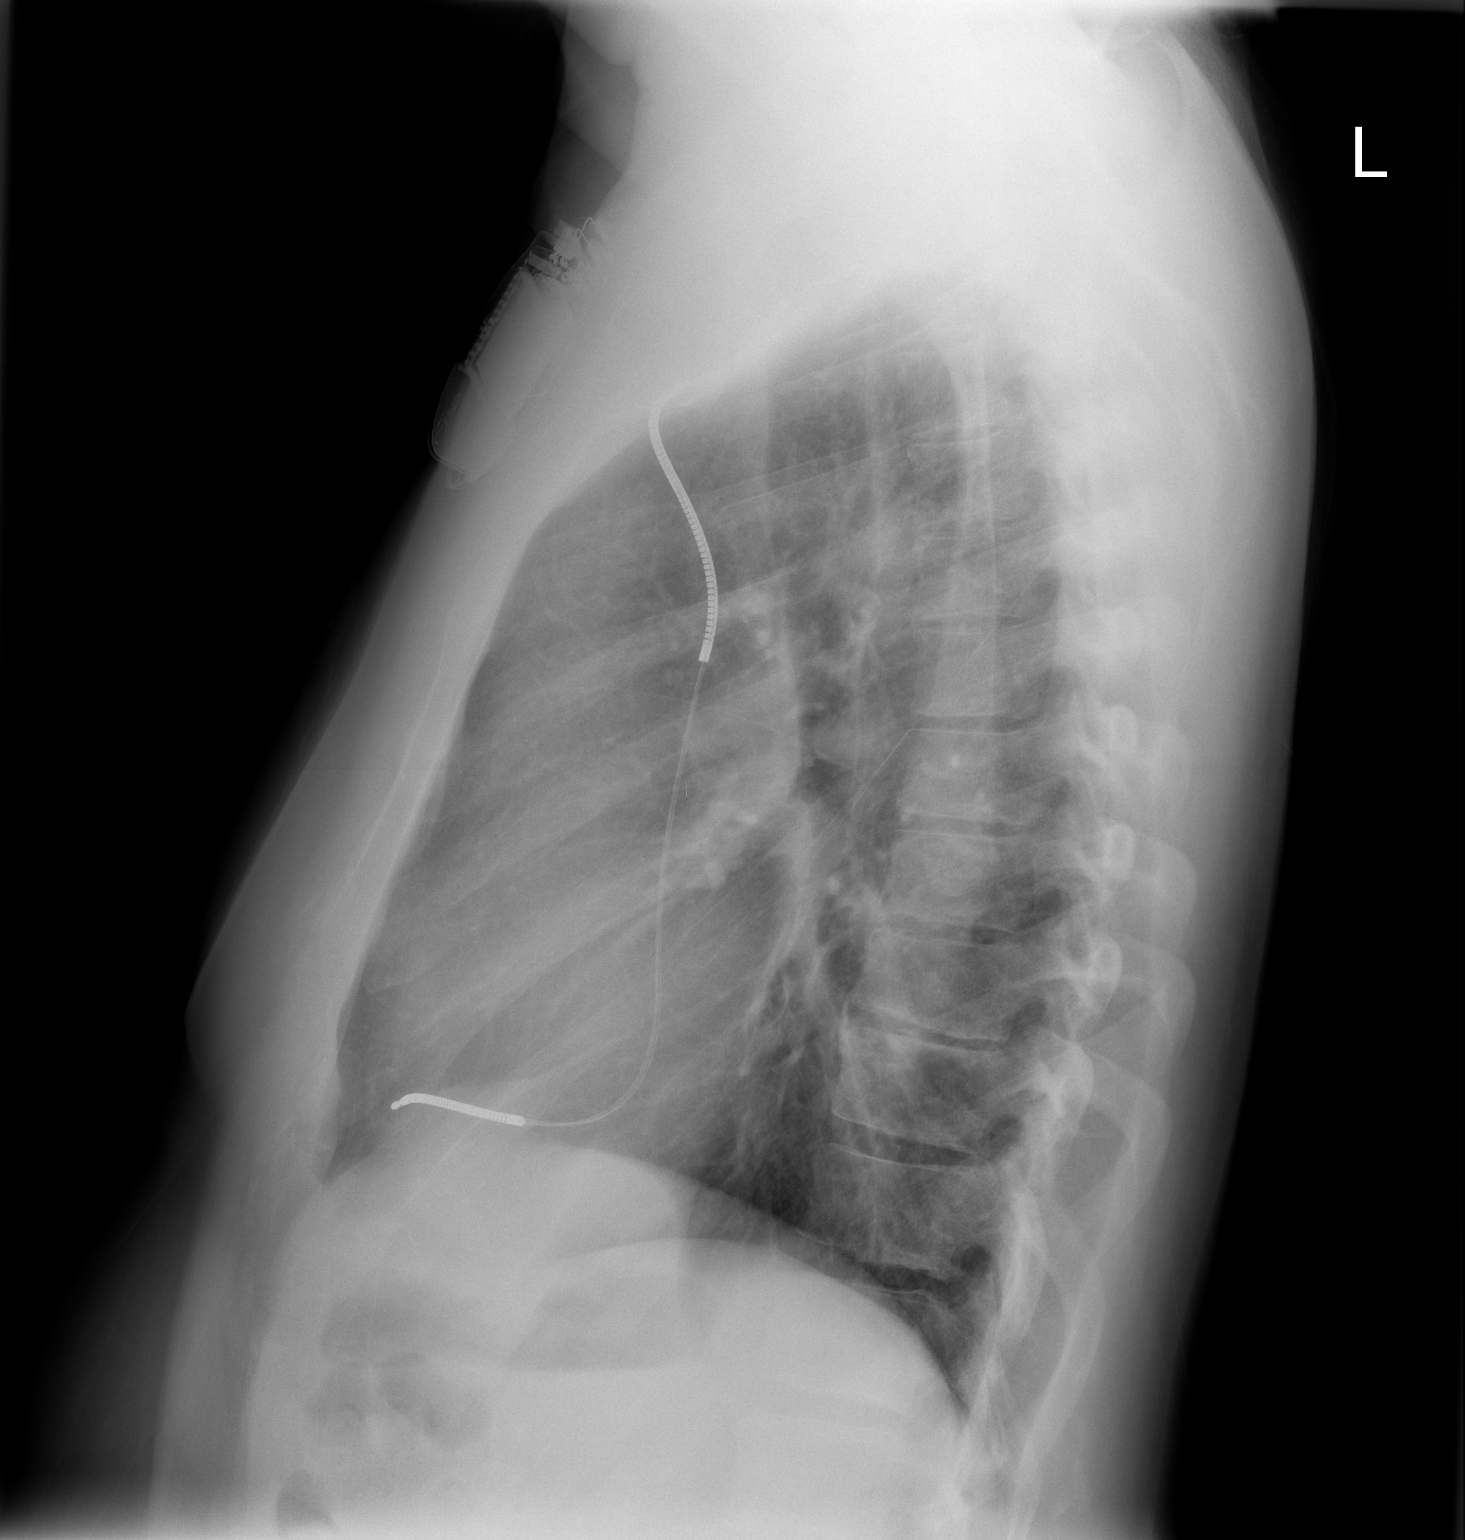

[2 of 2 positions shown; findings below may reference images not displayed]

FINDINGS: Improving central vascular congestion.  Borderline heart
size.  Negative for CHF or enlarging effusion.  No focal pneumonia
or pneumothorax.  Left subclavian pacer evident.
IMPRESSION: Negative for CHF or pneumonia.

## 2011-12-14 MED ORDER — FUROSEMIDE 40 MG PO TABS
40.0000 mg | ORAL_TABLET | Freq: Two times a day (BID) | ORAL | Status: DC
  Administered 2011-12-14 – 2011-12-15 (×2): 40 mg via ORAL
  Filled 2011-12-14 (×4): qty 1

## 2011-12-14 MED ORDER — WARFARIN SODIUM 7.5 MG PO TABS
7.5000 mg | ORAL_TABLET | Freq: Once | ORAL | Status: AC
  Administered 2011-12-14: 7.5 mg via ORAL
  Filled 2011-12-14: qty 1

## 2011-12-14 NOTE — Clinical Documentation Improvement (Signed)
CKD DOCUMENTATION CLARIFICATION QUERY   THIS DOCUMENT IS NOT A PERMANENT PART OF THE MEDICAL RECORD   Please update your documentation within the medical record to reflect your response to this query.                                                                                         12/14/11   Dr. [name] and/or Associates,  In a better effort to capture your patient's severity of illness, reflect appropriate length of stay and utilization of resources, a review of the patient medical record has revealed the following indicators:   "Acute on chronic kidney disease stage IV multifactorial" [name] N  12/14/2011, 8:42 AM Progress Note   Based on your clinical judgment, please document in the progress notes and discharge summary if a condition below provides greater specificity regarding the patient's kidney status during this admission:   - Acute Kidney Injury on Stage IV CKD   - Acute Renal Failure on Stage IV CKD   - Other Condition   - Unable to Clinically Determine    In responding to this query please exercise your independent judgment.    The fact that a query is asked, does not imply that any particular answer is desired or expected.   Reviewed:    Thank You,  [name]  RN BSN CCDS Certified Clinical Documentation Specialist: Cell   [phone]  Health Information Management [place]  TO RESPOND TO THE THIS QUERY, FOLLOW THE INSTRUCTIONS BELOW:  1. If needed, update documentation for the patient's encounter via the notes activity.  2. Access this query again and click edit on the In [employer].  3. After updating, or not, click F2 to complete all highlighted (required) fields concerning your review. Select "additional documentation in the medical record" OR "no additional documentation provided".  4. Click Sign note button.  5. The deficiency will fall out of your In Basket *Please let us know if you are not able to complete this  workflow by phone or e-mail (listed below).

## 2011-12-14 NOTE — Progress Notes (Addendum)
ANTICOAGULATION CONSULT NOTE - Follow Up Consult  Pharmacy Consult for Heparin and Coumadin Indication: atrial fibrillation  No Known Allergies  Patient Measurements: Height: 6' 3.5" (191.8 cm) Weight: 214 lb 8 oz (97.297 kg) (scale b) IBW/kg (Calculated) : 85.65  Heparin Dosing Weight: 97kg  Vital Signs: Temp: 97.7 F (36.5 C) (12/12 0548) Temp src: Oral (12/12 0548) BP: 93/75 mmHg (12/12 0548) Pulse Rate: 72  (12/12 0612)  Labs:  Basename 12/14/11 0455 12/13/11 0550 12/12/11 0614 12/11/11 0615  HGB 15.6 15.9 -- --  HCT 45.6 45.5 45.5 --  PLT 269 274 256 --  APTT -- -- -- --  LABPROT 24.0* 20.6* 16.1* --  INR 2.26* 1.84* 1.32 --  HEPARINUNFRC 0.88* 0.57 0.72* --  CREATININE -- -- 2.42* 2.12*  CKTOTAL -- -- -- --  CKMB -- -- -- --  TROPONINI -- -- -- --    Estimated Creatinine Clearance: 38.9 ml/min (by C-G formula based on Cr of 2.42).   Medications:  Heparin 1350 units/hr  Assessment: 61yom on heparin bridging to Coumadin for Afib. Heparin level (0.88) is above-goal on 1350 units/hr. Confirmed with RN that lab was drawn appropriately. INR (2.26) is at-goal.   Goal of Therapy:  INR 2-3 Heparin level 0.3-0.7 units/ml Monitor platelets by anticoagulation protocol: Yes   Plan:  1. Decrease IV heparin to 1150 units/hr.  2. Heparin level in 6 hours. 3. Coumadin 7.5mg  po today. 4. Daily PT / INR  [name] 12/14/2011,6:14 AM  Addendum: With therapeutic INR, MD has discontinued heparin drip. Continue with Coumadin 7.5mg  today. Recommend to continue Coumadin 7.5mg  daily on discharge with INR follow-up in 5-7 days. Follow-up AM INR and discharge plans.   [name], PharmD [phone] 12/14/2011, 10:51 AM

## 2011-12-14 NOTE — Progress Notes (Signed)
Subjective:  Patient denies any chest pain but complains of shortness of breath with minimal exertion. Also complains of vague epigastric pain. Patient had extensive GI workup in the past was negative. INR in therapeutic range states don't feel a going home today. Will reevaluate later  Objective:  Vital Signs in the last 24 hours: Temp:  [97.7 F (36.5 C)] 97.7 F (36.5 C) (12/12 0548) Pulse Rate:  [54-106] 72  (12/12 0612) Resp:  [18-19] 18  (12/12 0548) BP: (88-109)/(60-78) 93/75 mmHg (12/12 0548) SpO2:  [97 %-100 %] 100 % (12/12 0548) Weight:  [97.297 kg (214 lb 8 oz)] 97.297 kg (214 lb 8 oz) (12/12 0548)  Intake/Output from previous day: 12/11 0701 - 12/12 0700 In: 779 [P.O.:540; I.V.:234; IV Piggyback:5] Out: 1320 [Urine:1320] Intake/Output from this shift:    Physical Exam: Neck: no adenopathy, no carotid bruit, no JVD and supple, symmetrical, trachea midline Lungs: clear to auscultation bilaterally Heart: irregularly irregular rhythm, S1, S2 normal and Soft systolic murmur noted Abdomen: soft, non-tender; bowel sounds normal; no masses,  no organomegaly Extremities: extremities normal, atraumatic, no cyanosis or edema  Lab Results:  Basename 12/14/11 0455 12/13/11 0550  WBC 4.7 4.8  HGB 15.6 15.9  PLT 269 274    Basename 12/14/11 0455 12/12/11 0614  NA 136 136  K 4.3 4.4  CL 97 99  CO2 25 22  GLUCOSE 101* 101*  BUN 44* 31*  CREATININE 3.29* 2.42*   No results found for this basename: TROPONINI:2,CK,MB:2 in the last 72 hours Hepatic Function Panel No results found for this basename: PROT,ALBUMIN,AST,ALT,ALKPHOS,BILITOT,BILIDIR,IBILI in the last 72 hours No results found for this basename: CHOL in the last 72 hours No results found for this basename: PROTIME in the last 72 hours  Imaging: Imaging results have been reviewed and No results found.  Cardiac Studies:  Assessment/Plan:  Compensated systolic heart failure  Nonischemic dilated cardio myopathy  status post ICD  Chronic atrial fibrillation  Hypertension  Acute on chronic kidney disease stage IV multifactorial Gouty arthritis  Status post nonsustained VT asymptomatic   history of GERD Plan Restart Carafate as per orders DC heparin Reduce Lasix dose as per orders Will follow renal function closely as outpatient Will reevaluate patient later today and possible discharge  LOS: 5 days    [name] N 12/14/2011, 8:42 AM

## 2011-12-15 LAB — CBC
HCT: 44.5 % (ref 39.0–52.0)
MCH: 30.5 pg (ref 26.0–34.0)
MCHC: 34.6 g/dL (ref 30.0–36.0)
MCV: 88.1 fL (ref 78.0–100.0)
RDW: 14.1 % (ref 11.5–15.5)
WBC: 4.5 10*3/uL (ref 4.0–10.5)

## 2011-12-15 LAB — BASIC METABOLIC PANEL
BUN: 47 mg/dL — ABNORMAL HIGH (ref 6–23)
Calcium: 9.8 mg/dL (ref 8.4–10.5)
Chloride: 95 mEq/L — ABNORMAL LOW (ref 96–112)
Creatinine, Ser: 3.03 mg/dL — ABNORMAL HIGH (ref 0.50–1.35)
GFR calc Af Amer: 24 mL/min — ABNORMAL LOW (ref >90)

## 2011-12-15 MED ORDER — ALPRAZOLAM 0.25 MG PO TABS: 0.2500 mg | ORAL_TABLET | Freq: Two times a day (BID) | ORAL | Status: DC | PRN

## 2011-12-15 MED ORDER — WARFARIN SODIUM 7.5 MG PO TABS
7.5000 mg | ORAL_TABLET | Freq: Once | ORAL | Status: DC
  Filled 2011-12-15: qty 1

## 2011-12-15 MED ORDER — SUCRALFATE 1 G PO TABS: 1.0000 g | ORAL_TABLET | Freq: Four times a day (QID) | ORAL | Status: DC

## 2011-12-15 MED ORDER — HYDRALAZINE HCL 10 MG PO TABS: 10.0000 mg | ORAL_TABLET | Freq: Three times a day (TID) | ORAL | Status: DC

## 2011-12-15 MED ORDER — CARVEDILOL 3.125 MG PO TABS: 3.1250 mg | ORAL_TABLET | Freq: Two times a day (BID) | ORAL | Status: DC

## 2011-12-15 NOTE — Progress Notes (Signed)
D/c instructions given to pt regarding f/u care and appointments, s/s of when to notify MD, home medications and diet/activity restrictions.  Pt acknowledged receipt with all questions answered and pt verbalized understanding.  IV and telemetry removed from pt.  Will transport via wheelchair when ride is available.

## 2011-12-15 NOTE — Progress Notes (Signed)
ANTICOAGULATION CONSULT NOTE - Follow Up Consult  Pharmacy Consult for Heparin and Coumadin Indication: atrial fibrillation  No Known Allergies  Patient Measurements: Height: 6' 3.5" (191.8 cm) Weight: 213 lb 3 oz (96.7 kg) ( b scale) IBW/kg (Calculated) : 85.65  Heparin Dosing Weight: 97kg  Vital Signs: Temp: 97.8 F (36.6 C) (12/13 0509) Temp src: Oral (12/13 0509) BP: 112/90 mmHg (12/13 0947) Pulse Rate: 98  (12/13 0947)  Labs:  Basename 12/15/11 0500 12/14/11 0455 12/13/11 0550  HGB -- 15.6 15.9  HCT -- 45.6 45.5  PLT -- 269 274  APTT -- -- --  LABPROT 23.6* 24.0* 20.6*  INR 2.21* 2.26* 1.84*  HEPARINUNFRC -- 0.88* 0.57  CREATININE -- 3.29* --  CKTOTAL -- -- --  CKMB -- -- --  TROPONINI -- -- --    Estimated Creatinine Clearance: 28.6 ml/min (by C-G formula based on Cr of 3.29).   Medications:  Heparin 1350 units/hr  Assessment: 61yom on Coumadin PTA 7.5mg  daily for Afib with an admit INR of 1.0. He was on heparin bridging to Coumadin-- heparin now d/c'd due to INR being within therapeutic range. INR today is 2.21.  Goal of Therapy:  INR 2-3 Monitor platelets by anticoagulation protocol: Yes   Plan:  - Coumadin 7.5mg  po x 1 dose tonight - F/U AM INR and discharge plans    Thank you, Franchot Erichsen, Pharm.D. Clinical Pharmacist   12/15/2011 10:17 AM

## 2011-12-16 NOTE — Discharge Summary (Signed)
[name], [name]             ACCOUNT NO.:  [id number]  MEDICAL RECORD NO.:  [id number]  LOCATION:  4734                         FACILITY:  [facility]  PHYSICIAN:  Eduardo Osier. [name], M.D. DATE OF BIRTH:  03-03-1950  DATE OF ADMISSION:  12/09/2011 DATE OF DISCHARGE:  12/15/2011                              DISCHARGE SUMMARY   ADMITTING DIAGNOSES: 1. Acute left heart systolic failure. 2. Atrial fibrillation with rapid ventricular response. 3. Hypertension. 4. Chronic kidney disease. 5. Gouty arthritis. 6. Coronary artery disease. 7. Chest pain. 8. Ischemic cardiomyopathy, status post implantable cardioverter-     defibrillator.  FINAL DIAGNOSES: 1. Compensated systolic heart failure. 2. Nonischemic dilated cardiomyopathy, status post  implantable     cardioverter-defibrillator. 3. Chronic atrial fibrillation. 4. Hypertension. 5. Chronic kidney disease stage IV. 6. Gouty arthritis. 7. Status post nonsustained ventricular tachycardia, asymptomatic. 8. Gastroesophageal reflux disease.  DISCHARGE HOME MEDICATIONS: 1. Xanax 0.25 mg twice daily. 2. Carvedilol 3.125 mg twice daily. 3. Hydralazine 10 mg 3 times daily. 4. Carafate 1 g every 6 hours. 5. Amiodarone 200 mg one tablet daily. 6. Furosemide 80 mg twice daily. 7. Protonix 40 mg one tablet daily. 8. Crestor 5 mg daily at bedtime. 9. Coumadin 7.5 mg one tablet daily as before. 10.Ambien 5 mg daily at night. 11.The patient has been advised to stop aspirin and colchicine.  DIET:  Low salt, low cholesterol.  Heart failure instructions have been given.  The patient has also been given instructions regarding fluid restriction and sodium restrictions. The patient has been advised to monitor weights daily and chart.  The patient has been advised to take all the medications regularly.  Check PT/INR in one week.  Followup with me in one week.  CONDITION AT DISCHARGE:  Stable.  BRIEF HISTORY AND HOSPITAL COURSE:  [name]  is a 61 year old male with past medical history significant for multiple medical problems, i.e., nonischemic dilated cardiomyopathy status post BiV ICD, history of recurrent congestive systolic heart failure, chronic atrial fibrillation, history of nonsustained VT, GERD, hypercholesteremia, history of sarcoidosis, chronic kidney disease stage IV, hypertension was admitted by Dr. Algie Coffer on December 7 because of worsening shortness of breath associated with chest pain, orthopnea, abdominal swelling for 1 week and was noted to be in decompensated systolic heart failure.  PAST MEDICAL HISTORY:  As above.  PAST SURGICAL HISTORY:  He had back surgery in the past.  Had permanent pacemaker/ICD insertion in the past.  Had TEE cardioversion in January of 2013, subsequently converted back into atrial fibrillation as anterior cervical fusion in August 2013.  PHYSICAL EXAMINATION:  VITAL SIGNS:  His blood pressure was 134/95, pulse was 106.  He was afebrile.  HEENT:  Conjunctivae was pink.  NECK: Supple.  Positive JVD.  LUNGS:  He has bilateral crackles at the bases. CARDIOVASCULAR EXAM:  Irregularly irregular and was tachycardic. ABDOMEN:  Soft, distended, nontender.  EXTREMITIES:  No clubbing, cyanosis, or edema.  LABS:  Sodium was 141, potassium 4.1, BUN 23, creatinine 2.02.  His 3 sets of cardiac enzymes were normal.  His BNP was 11,000.  Repeat BNP was 2478.  Yesterday BNP was 1014 which was trending down.  Hemoglobin was 12.1, hematocrit  was 37.1, white count of 6.6.  Today, hemoglobin is 15.4, hematocrit 44.5, white count of 4.5.  This has been stable.  His INR admission, PT was 13.1, INR 1.0.  His PT is 23.6, INR 4.21.  A 2-D echo done on December 8 showed ejection fraction, LV was mildly dilated with severely reduced LV systolic function and EF of 35% with diffuse hypokinesia.  There was moderate tricuspid regurgitation and moderate mitral regurgitation.  BRIEF HOSPITAL COURSE:  The  patient was admitted to telemetry unit, and was ruled out by serial enzymes and EKG.  The patient did not have any anginal chest pain during the hospital stay.  The patient was started on IV Lasix and restarted on Coumadin and heparin.  His INR is in therapeutic range.  His liver function has progressively deteriorated because of IV Lasix which has been switched to p.o. and his renal function is trending downward.  The patient is ambulating in hallway without any problems.  The patient will be discharged home on above medications and will be followed up in my office for a week.  The patient will be followed as outpatient with heart failure clinic and [facility].     Eduardo Osier. [name], M.D.     [facility]  D:  12/15/2011  T:  12/16/2011  Job:  [id number]

## 2011-12-29 ENCOUNTER — Emergency Department (HOSPITAL_COMMUNITY)
Admission: EM | Admit: 2011-12-29 | Discharge: 2011-12-30 | Disposition: A | Discharge status: Home / Self Care | Payer: PRIVATE HEALTH INSURANCE | Attending: Emergency Medicine | Admitting: Emergency Medicine

## 2011-12-29 ENCOUNTER — Emergency Department (HOSPITAL_COMMUNITY): Payer: PRIVATE HEALTH INSURANCE

## 2011-12-29 ENCOUNTER — Encounter (HOSPITAL_COMMUNITY): Payer: Self-pay | Admitting: Emergency Medicine

## 2011-12-29 DIAGNOSIS — Z7901 Long term (current) use of anticoagulants: Secondary | ICD-10-CM | POA: Insufficient documentation

## 2011-12-29 DIAGNOSIS — Z79899 Other long term (current) drug therapy: Secondary | ICD-10-CM | POA: Insufficient documentation

## 2011-12-29 DIAGNOSIS — I4891 Unspecified atrial fibrillation: Secondary | ICD-10-CM | POA: Insufficient documentation

## 2011-12-29 DIAGNOSIS — I129 Hypertensive chronic kidney disease with stage 1 through stage 4 chronic kidney disease, or unspecified chronic kidney disease: Secondary | ICD-10-CM | POA: Insufficient documentation

## 2011-12-29 DIAGNOSIS — E7800 Pure hypercholesterolemia, unspecified: Secondary | ICD-10-CM | POA: Insufficient documentation

## 2011-12-29 DIAGNOSIS — Z8679 Personal history of other diseases of the circulatory system: Secondary | ICD-10-CM | POA: Insufficient documentation

## 2011-12-29 DIAGNOSIS — Z8639 Personal history of other endocrine, nutritional and metabolic disease: Secondary | ICD-10-CM | POA: Insufficient documentation

## 2011-12-29 DIAGNOSIS — M79609 Pain in unspecified limb: Secondary | ICD-10-CM | POA: Insufficient documentation

## 2011-12-29 DIAGNOSIS — Z950 Presence of cardiac pacemaker: Secondary | ICD-10-CM | POA: Insufficient documentation

## 2011-12-29 DIAGNOSIS — I252 Old myocardial infarction: Secondary | ICD-10-CM | POA: Insufficient documentation

## 2011-12-29 DIAGNOSIS — Z95810 Presence of automatic (implantable) cardiac defibrillator: Secondary | ICD-10-CM | POA: Insufficient documentation

## 2011-12-29 DIAGNOSIS — I509 Heart failure, unspecified: Secondary | ICD-10-CM | POA: Insufficient documentation

## 2011-12-29 DIAGNOSIS — I2510 Atherosclerotic heart disease of native coronary artery without angina pectoris: Secondary | ICD-10-CM | POA: Insufficient documentation

## 2011-12-29 DIAGNOSIS — N183 Chronic kidney disease, stage 3 unspecified: Secondary | ICD-10-CM | POA: Insufficient documentation

## 2011-12-29 DIAGNOSIS — K219 Gastro-esophageal reflux disease without esophagitis: Secondary | ICD-10-CM | POA: Insufficient documentation

## 2011-12-29 DIAGNOSIS — Z862 Personal history of diseases of the blood and blood-forming organs and certain disorders involving the immune mechanism: Secondary | ICD-10-CM | POA: Insufficient documentation

## 2011-12-29 DIAGNOSIS — Z8619 Personal history of other infectious and parasitic diseases: Secondary | ICD-10-CM | POA: Insufficient documentation

## 2011-12-29 LAB — CBC WITH DIFFERENTIAL/PLATELET
HCT: 40.6 % (ref 39.0–52.0)
Hemoglobin: 13.3 g/dL (ref 13.0–17.0)
Lymphocytes Relative: 40 % (ref 12–46)
Lymphs Abs: 2.1 10*3/uL (ref 0.7–4.0)
Monocytes Absolute: 0.5 10*3/uL (ref 0.1–1.0)
Monocytes Relative: 10 % (ref 3–12)
Neutro Abs: 2.2 10*3/uL (ref 1.7–7.7)
Neutrophils Relative %: 42 % — ABNORMAL LOW (ref 43–77)
RBC: 4.48 MIL/uL (ref 4.22–5.81)

## 2011-12-29 LAB — COMPREHENSIVE METABOLIC PANEL
ALT: 94 U/L — ABNORMAL HIGH (ref 0–53)
AST: 104 U/L — ABNORMAL HIGH (ref 0–37)
CO2: 21 mEq/L (ref 19–32)
Calcium: 9.7 mg/dL (ref 8.4–10.5)
Creatinine, Ser: 1.74 mg/dL — ABNORMAL HIGH (ref 0.50–1.35)
GFR calc Af Amer: 47 mL/min — ABNORMAL LOW (ref >90)
GFR calc non Af Amer: 41 mL/min — ABNORMAL LOW (ref >90)
Glucose, Bld: 120 mg/dL — ABNORMAL HIGH (ref 70–99)
Sodium: 139 mEq/L (ref 135–145)
Total Protein: 7.6 g/dL (ref 6.0–8.3)

## 2011-12-29 LAB — POCT I-STAT TROPONIN I: Troponin i, poc: 0.02 ng/mL (ref 0.00–0.08)

## 2011-12-29 NOTE — ED Notes (Signed)
Patient transported to X-ray 

## 2011-12-29 NOTE — ED Notes (Signed)
PT. REPORTS PROGRESSING SOB WITH PRODUCTIVE COUGH /CHEST TIGHTNESS / ABDOMINAL SWELLING ONSET YESTERDAY , STATES HISTORY OF CHF , INCREASED HIS DOSE OF LASIX TO 100 MG BID WITH NO IMPROVEMENT.

## 2011-12-30 LAB — PROTIME-INR
INR: 1.72 — ABNORMAL HIGH (ref 0.00–1.49)
Prothrombin Time: 19.6 seconds — ABNORMAL HIGH (ref 11.6–15.2)

## 2011-12-30 MED ORDER — DILTIAZEM HCL 25 MG/5ML IV SOLN
20.0000 mg | Freq: Once | INTRAVENOUS | Status: AC
  Administered 2011-12-30: 20 mg via INTRAVENOUS
  Filled 2011-12-30: qty 5

## 2011-12-30 MED ORDER — FUROSEMIDE 10 MG/ML IJ SOLN
40.0000 mg | Freq: Once | INTRAMUSCULAR | Status: AC
  Administered 2011-12-30: 40 mg via INTRAVENOUS
  Filled 2011-12-30: qty 4

## 2011-12-30 MED ORDER — WARFARIN SODIUM 10 MG PO TABS
10.0000 mg | ORAL_TABLET | Freq: Once | ORAL | Status: AC
  Administered 2011-12-30: 10 mg via ORAL
  Filled 2011-12-30: qty 1

## 2011-12-30 NOTE — ED Notes (Signed)
Per EDP request, pt ambulated in hallway. RR 18, even & unlabored, EDP at bedside and aware of improvement.

## 2011-12-30 NOTE — ED Notes (Signed)
Ambulated pt in hallway per EDP request. Pt became increasingly SOB, RR of 40. While ambulating pt c/o heaviness in chest. EDP aware.

## 2011-12-30 NOTE — ED Provider Notes (Addendum)
History     CSN: [id number]  Arrival date & time 12/29/11  2216   First MD Initiated Contact with Patient 12/30/11 0329      Chief Complaint  Patient presents with  . Shortness of Breath    (Consider location/radiation/quality/duration/timing/severity/associated sxs/prior treatment) HPI This is a 61 year old male with a history of CHF and atrial fibrillation. He is here with a two-day history of shortness of breath associated with a burning in his chest and pain in his left arm. Symptoms are exacerbated by exertion and improved with rest. He contacted his cardiologist yesterday who advised that he increase his Lasix to 100 mg twice daily. He states he did this but without relief. On arrival he was given 40 mg of Lasix IV per protocol with significant diuresis. He was noted to be an extra fibrillation with rapid ventricular response. He states the symptoms are moderate to severe at their worst. He is not having chest pain at the present time. He states he has had edema of the hands and abdomen but none in the legs. He has also had a productive cough.  Past Medical History  Diagnosis Date  . CHF (congestive heart failure)   . Sarcoidosis   . Cardiomyopathy, dilated, nonischemic     non ischemic by cath  . Acute on chronic systolic heart failure   . Automatic implantable cardiac defibrillator in situ   . Atrial fibrillation   . NSVT (nonsustained ventricular tachycardia)   . GERD (gastroesophageal reflux disease)   . Hypercholesteremia   . Myocardial infarction   . Shortness of breath   . Chronic kidney disease (CKD), stage III (moderate)   . Pacemaker   . Anginal pain   . Gout   . Hypertension     [name]  . Coronary artery disease     Past Surgical History  Procedure Date  . Back surgery 1987    Ruptured disk repair  . Pacemaker insertion     with ICD  . Tee without cardioversion 01/17/2011    Procedure: TRANSESOPHAGEAL ECHOCARDIOGRAM (TEE);  Surgeon: Ricki Rodriguez, MD;  Location: [facility] ENDOSCOPY;  Service: Cardiovascular;  Laterality: N/A;  . Cardioversion 01/17/2011    Procedure: CARDIOVERSION;  Surgeon: Ricki Rodriguez, MD;  Location: [facility] ENDOSCOPY;  Service: Cardiovascular;  Laterality: N/A;  . Cardiac catheterization   . Insert / replace / remove pacemaker   . Anterior cervical decomp/discectomy fusion 08/21/2011    Procedure: ANTERIOR CERVICAL DECOMPRESSION/DISCECTOMY FUSION 2 LEVELS;  Surgeon: Eldred Manges, MD;  Location: [facility] OR;  Service: Orthopedics;  Laterality: N/A;  C5-6, C6-7 Anterior Cervical Discectomy and Fusion, allograft, plate    Family History  Problem Relation Age of Onset  . Heart disease    . Heart failure    . Stroke    . Anesthesia problems Neg Hx   . Hypotension Neg Hx   . Malignant hyperthermia Neg Hx   . Pseudochol deficiency Neg Hx     History  Substance Use Topics  . Smoking status: Never Smoker   . Smokeless tobacco: Never Used  . Alcohol Use: No      Review of Systems  All other systems reviewed and are negative.    Allergies  Review of patient's allergies indicates no known allergies.  Home Medications   Current Outpatient Rx  Name  Route  Sig  Dispense  Refill  . ALLOPURINOL 300 MG PO TABS   Oral   Take 300 mg by mouth daily.         Marland Kitchen  ALPRAZOLAM 0.25 MG PO TABS   Oral   Take 1 tablet (0.25 mg total) by mouth 2 (two) times daily as needed for anxiety.   60 tablet   3   . AMIODARONE HCL 200 MG PO TABS   Oral   Take 1 tablet (200 mg total) by mouth daily.         Marland Kitchen CARVEDILOL 3.125 MG PO TABS   Oral   Take 1 tablet (3.125 mg total) by mouth 2 (two) times daily.   60 tablet   3   . FUROSEMIDE 80 MG PO TABS   Oral   Take 100 mg by mouth 2 (two) times daily.          . ISOSORB DINITRATE-HYDRALAZINE 20-37.5 MG PO TABS   Oral   Take 1 tablet by mouth 3 (three) times daily.         Marland Kitchen PANTOPRAZOLE SODIUM 40 MG PO TBEC   Oral   Take 1 tablet (40 mg total) by mouth daily at 12  noon.         Marland Kitchen ROSUVASTATIN CALCIUM 5 MG PO TABS   Oral   Take 1 tablet (5 mg total) by mouth at bedtime.         . WARFARIN SODIUM 10 MG PO TABS   Oral   Take 10 mg by mouth daily.         Marland Kitchen ZOLPIDEM TARTRATE 5 MG PO TABS   Oral   Take 5-10 mg by mouth at bedtime as needed. For sleep           BP 119/99  Pulse 40  Temp 98.3 F (36.8 C) (Oral)  Resp 26  SpO2 99%  Physical Exam General: Well-developed, well-nourished male in no acute distress; appearance consistent with age of record HENT: normocephalic, atraumatic Eyes: pupils equal round and reactive to light; extraocular muscles intact Neck: supple Heart: Irregular rhythm; tachycardia Lungs: clear to auscultation bilaterally Abdomen: soft; mildly distended; nontender; bowel sounds present Extremities: No deformity; full range of motion Neurologic: Awake, alert and oriented; motor function intact in all extremities and symmetric; no facial droop Skin: Warm and dry Psychiatric: Normal mood and affect    ED Course  Procedures (including critical care time)     MDM   Nursing notes and vitals signs, including pulse oximetry, reviewed.  Summary of this visit's results, reviewed by myself:  Labs:  Results for orders placed during the hospital encounter of 12/29/11 (from the past 24 hour(s))  COMPREHENSIVE METABOLIC PANEL     Status: Abnormal   Collection Time   12/29/11 10:30 PM      Component Value Range   Sodium 139  135 - 145 mEq/L   Potassium 4.1  3.5 - 5.1 mEq/L   Chloride 105  96 - 112 mEq/L   CO2 21  19 - 32 mEq/L   Glucose, Bld 120 (*) 70 - 99 mg/dL   BUN 36 (*) 6 - 23 mg/dL   Creatinine, Ser 1.61 (*) 0.50 - 1.35 mg/dL   Calcium 9.7  8.4 - 09.6 mg/dL   Total Protein 7.6  6.0 - 8.3 g/dL   Albumin 2.9 (*) 3.5 - 5.2 g/dL   AST 045 (*) 0 - 37 U/L   ALT 94 (*) 0 - 53 U/L   Alkaline Phosphatase 83  39 - 117 U/L   Total Bilirubin 0.4  0.3 - 1.2 mg/dL   GFR calc non Af Amer 41 (*) >90 mL/min  GFR calc Af Amer 47 (*) >90 mL/min  CBC WITH DIFFERENTIAL     Status: Abnormal   Collection Time   12/29/11 10:30 PM      Component Value Range   WBC 5.2  4.0 - 10.5 K/uL   RBC 4.48  4.22 - 5.81 MIL/uL   Hemoglobin 13.3  13.0 - 17.0 g/dL   HCT 21.3  08.6 - 57.8 %   MCV 90.6  78.0 - 100.0 fL   MCH 29.7  26.0 - 34.0 pg   MCHC 32.8  30.0 - 36.0 g/dL   RDW 46.9  62.9 - 52.8 %   Platelets 225  150 - 400 K/uL   Neutrophils Relative 42 (*) 43 - 77 %   Neutro Abs 2.2  1.7 - 7.7 K/uL   Lymphocytes Relative 40  12 - 46 %   Lymphs Abs 2.1  0.7 - 4.0 K/uL   Monocytes Relative 10  3 - 12 %   Monocytes Absolute 0.5  0.1 - 1.0 K/uL   Eosinophils Relative 7 (*) 0 - 5 %   Eosinophils Absolute 0.4  0.0 - 0.7 K/uL   Basophils Relative 0  0 - 1 %   Basophils Absolute 0.0  0.0 - 0.1 K/uL  PRO B NATRIURETIC PEPTIDE     Status: Abnormal   Collection Time   12/29/11 10:30 PM      Component Value Range   Pro B Natriuretic peptide (BNP) 5000.0 (*) 0 - 125 pg/mL  POCT I-STAT TROPONIN I     Status: Normal   Collection Time   12/29/11 10:47 PM      Component Value Range   Troponin i, poc 0.02  0.00 - 0.08 ng/mL   Comment 3           PROTIME-INR     Status: Abnormal   Collection Time   12/30/11  4:23 AM      Component Value Range   Prothrombin Time 19.6 (*) 11.6 - 15.2 seconds   INR 1.72 (*) 0.00 - 1.49    Imaging Studies: Dg Chest 2 View  12/29/2011  *RADIOLOGY REPORT*  Clinical Data: Shortness of breath and left-sided chest pain.  CHEST - 2 VIEW  Comparison: Chest radiograph performed 12/09/2011  Findings: The lungs are well-aerated.  Mild vascular congestion is noted.  There is no evidence of focal opacification, pleural effusion or pneumothorax.  The heart is mildly enlarged.  An AICD is noted at the left chest wall, with a single lead ending at the right ventricle.  No acute osseous abnormalities are seen. Cervical spinal fusion hardware is noted.  IMPRESSION: Mild vascular congestion and  mild cardiomegaly; lungs remain grossly clear.   Original Report Authenticated By: Tonia Ghent, M.D.    EKG Interpretation:  Date & Time: 12/29/2011 10:26 PM  Rate: 114  Rhythm: Atrial fibrillation with RVR  QRS Axis: normal  Intervals: normal  ST/T Wave abnormalities: nonspecific ST/T changes  Conduction Disutrbances:left anterior fascicular block  Narrative Interpretation:   Old EKG Reviewed: unchanged  5:15 AM Despite diuresis and rate control with Cardizem patient becomes intolerably dyspneic on ambulation with tachypnea in the 40s. Dr. [name] consulted; he recommends additional diuresis with discharge if patient improved.  6:53 AM Patient is diuresis to about 1.5 L after 2 doses of Lasix 40 mg IV. He is now able to ambulate without significant dyspnea and states he feels improved enough to return home.  Hanley Seamen, MD 12/30/11 1610  Hanley Seamen, MD 12/30/11 [id number]

## 2011-12-30 NOTE — ED Notes (Signed)
Discharge instructions reviewed. Pt verbalized understanding of follow up care.

## 2012-01-09 ENCOUNTER — Other Ambulatory Visit: Payer: Self-pay

## 2012-01-09 ENCOUNTER — Emergency Department (HOSPITAL_COMMUNITY)
Admission: EM | Admit: 2012-01-09 | Discharge: 2012-01-10 | Disposition: A | Discharge status: Home / Self Care | Payer: PRIVATE HEALTH INSURANCE | Attending: Emergency Medicine | Admitting: Emergency Medicine

## 2012-01-09 ENCOUNTER — Emergency Department (HOSPITAL_COMMUNITY): Payer: PRIVATE HEALTH INSURANCE

## 2012-01-09 ENCOUNTER — Encounter (HOSPITAL_COMMUNITY): Payer: Self-pay | Admitting: Emergency Medicine

## 2012-01-09 DIAGNOSIS — N19 Unspecified kidney failure: Secondary | ICD-10-CM

## 2012-01-09 DIAGNOSIS — R141 Gas pain: Secondary | ICD-10-CM | POA: Insufficient documentation

## 2012-01-09 DIAGNOSIS — N183 Chronic kidney disease, stage 3 unspecified: Secondary | ICD-10-CM | POA: Insufficient documentation

## 2012-01-09 DIAGNOSIS — R112 Nausea with vomiting, unspecified: Secondary | ICD-10-CM | POA: Insufficient documentation

## 2012-01-09 DIAGNOSIS — Z8619 Personal history of other infectious and parasitic diseases: Secondary | ICD-10-CM | POA: Insufficient documentation

## 2012-01-09 DIAGNOSIS — R079 Chest pain, unspecified: Secondary | ICD-10-CM | POA: Insufficient documentation

## 2012-01-09 DIAGNOSIS — I252 Old myocardial infarction: Secondary | ICD-10-CM | POA: Insufficient documentation

## 2012-01-09 DIAGNOSIS — I4891 Unspecified atrial fibrillation: Secondary | ICD-10-CM

## 2012-01-09 DIAGNOSIS — I472 Ventricular tachycardia, unspecified: Secondary | ICD-10-CM | POA: Insufficient documentation

## 2012-01-09 DIAGNOSIS — G8929 Other chronic pain: Secondary | ICD-10-CM | POA: Insufficient documentation

## 2012-01-09 DIAGNOSIS — K219 Gastro-esophageal reflux disease without esophagitis: Secondary | ICD-10-CM | POA: Insufficient documentation

## 2012-01-09 DIAGNOSIS — I2510 Atherosclerotic heart disease of native coronary artery without angina pectoris: Secondary | ICD-10-CM | POA: Insufficient documentation

## 2012-01-09 DIAGNOSIS — I5023 Acute on chronic systolic (congestive) heart failure: Secondary | ICD-10-CM | POA: Insufficient documentation

## 2012-01-09 DIAGNOSIS — R142 Eructation: Secondary | ICD-10-CM | POA: Insufficient documentation

## 2012-01-09 DIAGNOSIS — Z7901 Long term (current) use of anticoagulants: Secondary | ICD-10-CM | POA: Insufficient documentation

## 2012-01-09 DIAGNOSIS — R109 Unspecified abdominal pain: Secondary | ICD-10-CM

## 2012-01-09 DIAGNOSIS — M109 Gout, unspecified: Secondary | ICD-10-CM | POA: Insufficient documentation

## 2012-01-09 DIAGNOSIS — Z95810 Presence of automatic (implantable) cardiac defibrillator: Secondary | ICD-10-CM | POA: Insufficient documentation

## 2012-01-09 DIAGNOSIS — R1013 Epigastric pain: Secondary | ICD-10-CM | POA: Insufficient documentation

## 2012-01-09 DIAGNOSIS — Z79899 Other long term (current) drug therapy: Secondary | ICD-10-CM | POA: Insufficient documentation

## 2012-01-09 DIAGNOSIS — Z8709 Personal history of other diseases of the respiratory system: Secondary | ICD-10-CM | POA: Insufficient documentation

## 2012-01-09 DIAGNOSIS — E7800 Pure hypercholesterolemia, unspecified: Secondary | ICD-10-CM | POA: Insufficient documentation

## 2012-01-09 DIAGNOSIS — I129 Hypertensive chronic kidney disease with stage 1 through stage 4 chronic kidney disease, or unspecified chronic kidney disease: Secondary | ICD-10-CM | POA: Insufficient documentation

## 2012-01-09 DIAGNOSIS — I4729 Other ventricular tachycardia: Secondary | ICD-10-CM | POA: Insufficient documentation

## 2012-01-09 LAB — COMPREHENSIVE METABOLIC PANEL
ALT: 53 U/L (ref 0–53)
AST: 62 U/L — ABNORMAL HIGH (ref 0–37)
CO2: 23 mEq/L (ref 19–32)
Calcium: 9.9 mg/dL (ref 8.4–10.5)
Chloride: 107 mEq/L (ref 96–112)
GFR calc Af Amer: 33 mL/min — ABNORMAL LOW (ref >90)
GFR calc non Af Amer: 29 mL/min — ABNORMAL LOW (ref >90)
Glucose, Bld: 107 mg/dL — ABNORMAL HIGH (ref 70–99)
Sodium: 143 mEq/L (ref 135–145)
Total Bilirubin: 0.5 mg/dL (ref 0.3–1.2)

## 2012-01-09 LAB — CBC WITH DIFFERENTIAL/PLATELET
Basophils Absolute: 0 10*3/uL (ref 0.0–0.1)
Eosinophils Relative: 4 % (ref 0–5)
Lymphocytes Relative: 48 % — ABNORMAL HIGH (ref 12–46)
MCV: 92.1 fL (ref 78.0–100.0)
Neutro Abs: 1.8 10*3/uL (ref 1.7–7.7)
Neutrophils Relative %: 37 % — ABNORMAL LOW (ref 43–77)
Platelets: 220 10*3/uL (ref 150–400)
RBC: 4.29 MIL/uL (ref 4.22–5.81)
RDW: 14.6 % (ref 11.5–15.5)
WBC: 5 10*3/uL (ref 4.0–10.5)

## 2012-01-09 LAB — PRO B NATRIURETIC PEPTIDE: Pro B Natriuretic peptide (BNP): 9049 pg/mL — ABNORMAL HIGH (ref 0–125)

## 2012-01-09 LAB — PROTIME-INR: Prothrombin Time: 32.6 seconds — ABNORMAL HIGH (ref 11.6–15.2)

## 2012-01-09 MED ORDER — DIGOXIN 0.25 MG/ML IJ SOLN
0.2500 mg | Freq: Once | INTRAMUSCULAR | Status: AC
  Administered 2012-01-09: 0.25 mg via INTRAVENOUS
  Filled 2012-01-09 (×2): qty 2

## 2012-01-09 MED ORDER — CARVEDILOL 3.125 MG PO TABS
3.1250 mg | ORAL_TABLET | Freq: Two times a day (BID) | ORAL | Status: DC
  Filled 2012-01-09: qty 1

## 2012-01-09 MED ORDER — MORPHINE SULFATE 4 MG/ML IJ SOLN
4.0000 mg | Freq: Once | INTRAMUSCULAR | Status: AC
  Administered 2012-01-09: 4 mg via INTRAVENOUS
  Filled 2012-01-09: qty 1

## 2012-01-09 MED ORDER — LORAZEPAM 2 MG/ML IJ SOLN
2.0000 mg | Freq: Once | INTRAMUSCULAR | Status: AC
  Administered 2012-01-09: 2 mg via INTRAVENOUS
  Filled 2012-01-09: qty 1

## 2012-01-09 MED ORDER — DEXTROSE 5 % IV SOLN: 60.0000 mg/h | Freq: Once | INTRAVENOUS | Status: DC

## 2012-01-09 MED ORDER — DIGOXIN 0.25 MG/ML IJ SOLN
0.2500 mg | Freq: Once | INTRAMUSCULAR | Status: AC
  Administered 2012-01-09: 0.25 mg via INTRAVENOUS
  Filled 2012-01-09: qty 2

## 2012-01-09 MED ORDER — AMIODARONE HCL 200 MG PO TABS
200.0000 mg | ORAL_TABLET | Freq: Once | ORAL | Status: DC
  Filled 2012-01-09: qty 1

## 2012-01-09 MED ORDER — IOHEXOL 300 MG/ML  SOLN: 20.0000 mL | INTRAMUSCULAR | Status: AC

## 2012-01-09 MED ORDER — CARVEDILOL 3.125 MG PO TABS
3.1250 mg | ORAL_TABLET | Freq: Once | ORAL | Status: AC
  Administered 2012-01-09: 3.125 mg via ORAL

## 2012-01-09 MED ORDER — AMIODARONE HCL 150 MG/3ML IV SOLN: 100.0000 mg | Freq: Once | INTRAVENOUS | Status: DC

## 2012-01-09 MED ORDER — AMIODARONE HCL IN DEXTROSE 360-4.14 MG/200ML-% IV SOLN
60.0000 mg/h | Freq: Once | INTRAVENOUS | Status: DC
  Filled 2012-01-09: qty 200

## 2012-01-09 MED ORDER — IOHEXOL 300 MG/ML  SOLN: 100.0000 mL | Freq: Once | INTRAMUSCULAR | Status: AC | PRN

## 2012-01-09 MED ORDER — SODIUM CHLORIDE 0.9 % IV BOLUS (SEPSIS)
500.0000 mL | Freq: Once | INTRAVENOUS | Status: AC
  Administered 2012-01-09: 500 mL via INTRAVENOUS

## 2012-01-09 NOTE — ED Notes (Signed)
Pt is becoming frustrated. He states " I take my medications like I'm supposed to and I'm still sick" "the Dr. Pietro Cassis telling me I'm not taking them when I know I am."

## 2012-01-09 NOTE — ED Notes (Signed)
Not able to get accurate temp on patient.  Patient breathing too rapidly.

## 2012-01-09 NOTE — ED Notes (Signed)
Pt c/o CP and epigastric pain with SOB x 2 days; pt sts worse when laying flat and worse after eating

## 2012-01-09 NOTE — ED Notes (Signed)
Pt c/o sob with activity, pain in epigastric area described as pressure rated 6/10 that radiates into chest. Pt states he developed a productive cough last night with white sputum.

## 2012-01-09 NOTE — ED Notes (Signed)
Patient transported to CT 

## 2012-01-09 NOTE — ED Provider Notes (Addendum)
History     CSN: [id number]  Arrival date & time 01/09/12  1610   First MD Initiated Contact with Patient 01/09/12 1642      Chief Complaint  Patient presents with  . Shortness of Breath  . Chest Pain  . Abdominal Pain    (Consider location/radiation/quality/duration/timing/severity/associated sxs/prior treatment) HPI Patient complaining of epigastric pain with nausea and vomiting for two days.  Pain radiates up to right side of chest and patient has increased dyspnea.  He has not noted fever or chills.  He has been taking his medicine as prescribed.  He sees dr. [name] for all medicines.  He denies sick contacts.  Pain is pressure like and worse with movement, deep breaths, and palpation.    BRIEF HISTORY AND HOSPITAL COURSE: Mr. Stepanek is a 62 year old male  with past medical history significant for multiple medical problems,  i.e., nonischemic dilated cardiomyopathy status post BiV ICD, history of  recurrent congestive systolic heart failure, chronic atrial  fibrillation, history of nonsustained VT, GERD, hypercholesteremia,  history of sarcoidosis, chronic kidney disease stage IV, hypertension  was admitted by Dr. Algie Coffer on December 7 because of worsening shortness  of breath associated with chest pain, orthopnea, abdominal swelling for  1 week and was noted to be in decompensated systolic heart failure.  PAST MEDICAL HISTORY: As above.  Past Medical History  Diagnosis Date  . CHF (congestive heart failure)   . Sarcoidosis   . Cardiomyopathy, dilated, nonischemic     non ischemic by cath  . Acute on chronic systolic heart failure   . Automatic implantable cardiac defibrillator in situ   . Atrial fibrillation   . NSVT (nonsustained ventricular tachycardia)   . GERD (gastroesophageal reflux disease)   . Hypercholesteremia   . Myocardial infarction   . Shortness of breath   . Chronic kidney disease (CKD), stage III (moderate)   . Pacemaker   . Anginal pain   . Gout    . Hypertension     [name]  . Coronary artery disease     Past Surgical History  Procedure Date  . Back surgery 1987    Ruptured disk repair  . Pacemaker insertion     with ICD  . Tee without cardioversion 01/17/2011    Procedure: TRANSESOPHAGEAL ECHOCARDIOGRAM (TEE);  Surgeon: Ricki Rodriguez, MD;  Location: [facility] ENDOSCOPY;  Service: Cardiovascular;  Laterality: N/A;  . Cardioversion 01/17/2011    Procedure: CARDIOVERSION;  Surgeon: Ricki Rodriguez, MD;  Location: [facility] ENDOSCOPY;  Service: Cardiovascular;  Laterality: N/A;  . Cardiac catheterization   . Insert / replace / remove pacemaker   . Anterior cervical decomp/discectomy fusion 08/21/2011    Procedure: ANTERIOR CERVICAL DECOMPRESSION/DISCECTOMY FUSION 2 LEVELS;  Surgeon: Eldred Manges, MD;  Location: [facility] OR;  Service: Orthopedics;  Laterality: N/A;  C5-6, C6-7 Anterior Cervical Discectomy and Fusion, allograft, plate    Family History  Problem Relation Age of Onset  . Heart disease    . Heart failure    . Stroke    . Anesthesia problems Neg Hx   . Hypotension Neg Hx   . Malignant hyperthermia Neg Hx   . Pseudochol deficiency Neg Hx     History  Substance Use Topics  . Smoking status: Never Smoker   . Smokeless tobacco: Never Used  . Alcohol Use: No      Review of Systems  Cardiovascular: Positive for chest pain.  Gastrointestinal: Positive for nausea, vomiting and abdominal pain.  Allergies  Review of patient's allergies indicates no known allergies.  Home Medications   Current Outpatient Rx  Name  Route  Sig  Dispense  Refill  . ALLOPURINOL 300 MG PO TABS   Oral   Take 300 mg by mouth daily.         Marland Kitchen ALPRAZOLAM 0.25 MG PO TABS   Oral   Take 1 tablet (0.25 mg total) by mouth 2 (two) times daily as needed for anxiety.   60 tablet   3   . AMIODARONE HCL 200 MG PO TABS   Oral   Take 1 tablet (200 mg total) by mouth daily.         Marland Kitchen CARVEDILOL 3.125 MG PO TABS   Oral   Take 1 tablet (3.125  mg total) by mouth 2 (two) times daily.   60 tablet   3   . FUROSEMIDE 80 MG PO TABS   Oral   Take 100 mg by mouth 2 (two) times daily.          . ISOSORB DINITRATE-HYDRALAZINE 20-37.5 MG PO TABS   Oral   Take 1 tablet by mouth 3 (three) times daily.         Marland Kitchen PANTOPRAZOLE SODIUM 40 MG PO TBEC   Oral   Take 1 tablet (40 mg total) by mouth daily at 12 noon.         Marland Kitchen ROSUVASTATIN CALCIUM 5 MG PO TABS   Oral   Take 1 tablet (5 mg total) by mouth at bedtime.         . WARFARIN SODIUM 10 MG PO TABS   Oral   Take 10 mg by mouth daily.         Marland Kitchen ZOLPIDEM TARTRATE 5 MG PO TABS   Oral   Take 5-10 mg by mouth at bedtime as needed. For sleep           BP 116/86  Pulse 122  Temp 98 F (36.7 C) (Oral)  Resp 24  SpO2 97%  Physical Exam  Nursing note and vitals reviewed. Constitutional: He is oriented to person, place, and time. He appears well-developed and well-nourished.  HENT:  Head: Normocephalic and atraumatic.  Eyes: Conjunctivae normal are normal. Pupils are equal, round, and reactive to light.  Neck: Normal range of motion. Neck supple.  Cardiovascular: An irregularly irregular rhythm present.  Pulmonary/Chest: No respiratory distress.  Abdominal: Soft. He exhibits distension. He exhibits no mass. There is tenderness. There is guarding.  Musculoskeletal: Normal range of motion.  Neurological: He is alert and oriented to person, place, and time.  Skin: Skin is warm and dry.  Psychiatric: He has a normal mood and affect.    ED Course  Procedures (including critical care time)  Labs Reviewed  CBC WITH DIFFERENTIAL - Abnormal; Notable for the following:    Hemoglobin 12.7 (*)     Neutrophils Relative 37 (*)     Lymphocytes Relative 48 (*)     All other components within normal limits  PRO B NATRIURETIC PEPTIDE - Abnormal; Notable for the following:    Pro B Natriuretic peptide (BNP) 9049.0 (*)     All other components within normal limits    COMPREHENSIVE METABOLIC PANEL - Abnormal; Notable for the following:    Glucose, Bld 107 (*)     BUN 42 (*)     Creatinine, Ser 2.33 (*)     Albumin 3.3 (*)     AST 62 (*)  GFR calc non Af Amer 29 (*)     GFR calc Af Amer 33 (*)     All other components within normal limits  LIPASE, BLOOD - Abnormal; Notable for the following:    Lipase 61 (*)     All other components within normal limits  PROTIME-INR   Dg Chest Port 1 View  01/09/2012  *RADIOLOGY REPORT*  Clinical Data: Shortness of breath and epigastric pain for greater than 12 hours.  PORTABLE CHEST - 1 VIEW  Comparison: 12/29/2011  Findings: Cardiac enlargement.  Left subclavian pacemaker, with single lead.   Mild vascular congestion.  No infiltrates or failure.  No effusion or pneumothorax.  Prior cervical fusion. Similar appearance to priors.  IMPRESSION: Cardiomegaly.  Mild vascular congestion.  Stable exam.   Original Report Authenticated By: Davonna Belling, M.D.      No diagnosis found.    MDM  Patient care discussed with Dr. [name] and advised patient with extensive work up for abdominal pain and multiple evaluations for his afib with rvr from nonischemic cardiomyopathy.  Advises amiodarone, digoxin and carvedilol and discharge if rate reduces andpatient remains hemodynamically stable.  He is very familiar with patient and will see in a.m.  Patient received ativan iv as very anxious- SBP 94 and amiodarone held and coreg held.  Patient given digoxin.    Repeat digoxin ordered and patient with rebounding bp- amiodarone and coreg ordered. Patient with history of noncompliance and normal cad on last cath.  Patient advised to recheck with Dr. [name] in am.     CT and troponin pending    Patient's care discussed with shared visit with Kyung Bacca, PA.  She will follow up with troponin and ct results.  Hilario Quarry, MD 01/09/12 1610  Hilario Quarry, MD 01/09/12 [id number]  Hilario Quarry, MD 01/09/12 [phone]

## 2012-01-10 MED ORDER — ONDANSETRON 4 MG PO TBDP
8.0000 mg | ORAL_TABLET | Freq: Once | ORAL | Status: AC
  Administered 2012-01-10: 8 mg via ORAL
  Filled 2012-01-10: qty 2

## 2012-01-10 NOTE — ED Notes (Signed)
Pt alert, NAD, calm, interactive, sitting on edge of bed, vomiting/wretching, clear saliva noted, d/c plan explained to pt by EDPA, pt not wanting to leave, EDPA aware.

## 2012-01-10 NOTE — ED Notes (Signed)
zofran given, EDPA to see pt, pt speaking with son on phone.

## 2012-01-10 NOTE — ED Notes (Addendum)
Pt d/c'd during down time, d/c'd at 0039, out in w/c to d/c desk, out to wait on taxi, alert, NAD, calm, interactive, skin W&D, resps e/u, speaking in clear complete sentences. intermittant coughing previous emesis seems to be post tussive emesis (clear saliva). LS CTA. (Denies: sx, concerns, needs or questions unaddressed), expresses frustration with sx "which he will share with Dr. [name] in the morning". Also expresses stress and family issues at home making things worse.

## 2012-01-10 NOTE — ED Notes (Signed)
PA at BS.  

## 2012-01-15 ENCOUNTER — Encounter (HOSPITAL_COMMUNITY): Payer: Self-pay | Admitting: *Deleted

## 2012-01-15 ENCOUNTER — Emergency Department (HOSPITAL_COMMUNITY)
Admission: EM | Admit: 2012-01-15 | Discharge: 2012-01-15 | Disposition: A | Discharge status: Home / Self Care | Payer: PRIVATE HEALTH INSURANCE | Attending: Emergency Medicine | Admitting: Emergency Medicine

## 2012-01-15 ENCOUNTER — Emergency Department (HOSPITAL_COMMUNITY): Payer: PRIVATE HEALTH INSURANCE

## 2012-01-15 DIAGNOSIS — I129 Hypertensive chronic kidney disease with stage 1 through stage 4 chronic kidney disease, or unspecified chronic kidney disease: Secondary | ICD-10-CM | POA: Insufficient documentation

## 2012-01-15 DIAGNOSIS — R0602 Shortness of breath: Secondary | ICD-10-CM | POA: Insufficient documentation

## 2012-01-15 DIAGNOSIS — Z8679 Personal history of other diseases of the circulatory system: Secondary | ICD-10-CM | POA: Insufficient documentation

## 2012-01-15 DIAGNOSIS — R1013 Epigastric pain: Secondary | ICD-10-CM | POA: Insufficient documentation

## 2012-01-15 DIAGNOSIS — Z862 Personal history of diseases of the blood and blood-forming organs and certain disorders involving the immune mechanism: Secondary | ICD-10-CM | POA: Insufficient documentation

## 2012-01-15 DIAGNOSIS — M109 Gout, unspecified: Secondary | ICD-10-CM | POA: Insufficient documentation

## 2012-01-15 DIAGNOSIS — N182 Chronic kidney disease, stage 2 (mild): Secondary | ICD-10-CM

## 2012-01-15 DIAGNOSIS — I420 Dilated cardiomyopathy: Secondary | ICD-10-CM

## 2012-01-15 DIAGNOSIS — I252 Old myocardial infarction: Secondary | ICD-10-CM | POA: Insufficient documentation

## 2012-01-15 DIAGNOSIS — Z95810 Presence of automatic (implantable) cardiac defibrillator: Secondary | ICD-10-CM | POA: Insufficient documentation

## 2012-01-15 DIAGNOSIS — R112 Nausea with vomiting, unspecified: Secondary | ICD-10-CM | POA: Insufficient documentation

## 2012-01-15 DIAGNOSIS — I10 Essential (primary) hypertension: Secondary | ICD-10-CM

## 2012-01-15 DIAGNOSIS — Z79899 Other long term (current) drug therapy: Secondary | ICD-10-CM | POA: Insufficient documentation

## 2012-01-15 DIAGNOSIS — K219 Gastro-esophageal reflux disease without esophagitis: Secondary | ICD-10-CM | POA: Insufficient documentation

## 2012-01-15 DIAGNOSIS — I428 Other cardiomyopathies: Secondary | ICD-10-CM | POA: Insufficient documentation

## 2012-01-15 DIAGNOSIS — I2510 Atherosclerotic heart disease of native coronary artery without angina pectoris: Secondary | ICD-10-CM | POA: Insufficient documentation

## 2012-01-15 DIAGNOSIS — E7800 Pure hypercholesterolemia, unspecified: Secondary | ICD-10-CM | POA: Insufficient documentation

## 2012-01-15 DIAGNOSIS — I4891 Unspecified atrial fibrillation: Secondary | ICD-10-CM

## 2012-01-15 DIAGNOSIS — I5022 Chronic systolic (congestive) heart failure: Secondary | ICD-10-CM | POA: Insufficient documentation

## 2012-01-15 DIAGNOSIS — Z950 Presence of cardiac pacemaker: Secondary | ICD-10-CM | POA: Insufficient documentation

## 2012-01-15 LAB — PRO B NATRIURETIC PEPTIDE: Pro B Natriuretic peptide (BNP): 6109 pg/mL — ABNORMAL HIGH (ref 0–125)

## 2012-01-15 LAB — COMPREHENSIVE METABOLIC PANEL
ALT: 48 U/L (ref 0–53)
AST: 71 U/L — ABNORMAL HIGH (ref 0–37)
Albumin: 3.3 g/dL — ABNORMAL LOW (ref 3.5–5.2)
Alkaline Phosphatase: 71 U/L (ref 39–117)
Chloride: 105 mEq/L (ref 96–112)
Potassium: 3.8 mEq/L (ref 3.5–5.1)
Sodium: 143 mEq/L (ref 135–145)
Total Bilirubin: 0.9 mg/dL (ref 0.3–1.2)

## 2012-01-15 LAB — POCT I-STAT TROPONIN I

## 2012-01-15 LAB — CBC
Platelets: 213 10*3/uL (ref 150–400)
RBC: 4.17 MIL/uL — ABNORMAL LOW (ref 4.22–5.81)
WBC: 4.8 10*3/uL (ref 4.0–10.5)

## 2012-01-15 MED ORDER — OXYCODONE-ACETAMINOPHEN 5-325 MG PO TABS: 1.0000 | ORAL_TABLET | ORAL | Status: DC | PRN

## 2012-01-15 MED ORDER — LORAZEPAM 2 MG/ML IJ SOLN
1.0000 mg | Freq: Once | INTRAMUSCULAR | Status: AC
  Administered 2012-01-15: 1 mg via INTRAVENOUS
  Filled 2012-01-15: qty 1

## 2012-01-15 MED ORDER — MORPHINE SULFATE 4 MG/ML IJ SOLN
4.0000 mg | Freq: Once | INTRAMUSCULAR | Status: AC
  Administered 2012-01-15: 4 mg via INTRAVENOUS
  Filled 2012-01-15: qty 1

## 2012-01-15 MED ORDER — POLYETHYLENE GLYCOL 3350 17 GM/SCOOP PO POWD: 17.0000 g | Freq: Two times a day (BID) | ORAL | Status: DC

## 2012-01-15 MED ORDER — LORAZEPAM 1 MG PO TABS: 0.5000 mg | ORAL_TABLET | Freq: Two times a day (BID) | ORAL | Status: DC | PRN

## 2012-01-15 MED ORDER — ONDANSETRON HCL 4 MG/2ML IJ SOLN
4.0000 mg | Freq: Once | INTRAMUSCULAR | Status: AC
  Administered 2012-01-15: 4 mg via INTRAVENOUS
  Filled 2012-01-15: qty 2

## 2012-01-15 NOTE — ED Notes (Signed)
Pt states that he cannot walk or eat and reports sob.  Pt states that he is having chest pain and feel like his chest is about to bust wide open

## 2012-01-15 NOTE — ED Provider Notes (Signed)
History     CSN: [id number]  Arrival date & time 01/15/12  1329   First MD Initiated Contact with Patient 01/15/12 1646      Chief Complaint  Patient presents with  . Chest Pain    (Consider location/radiation/quality/duration/timing/severity/associated sxs/prior treatment) The history is provided by the patient and medical records.    [name] is a 62 y.o. male  with a hx of CHF, sarcoid, cardiomyopathy, a-fib, GERD, MI, SOB, pacemaker, angina, gout, CAD, HTN presents to the Emergency Department complaining of gradual, persistent, progressively worsening abdominal pain onset 2 weeks ago with acute worsening this morning. Associated symptoms include shortness of breath, chest pain, bloating, chills, post tussive vomiting last night x 6-7.  Pt also c/o "feeling his a-fib."  Pain is epigastric in location, rated at a 7/10, radiating up into his chest, described as pressure, burning, and sharp.  Pt took 4 enemas over the course of 2 days with some relief.  Pt is without Hx of abd surgery or bowel obstruction.  Nothing makes it better and nothing makes it worse.  Pt denies fever, neck pain, headache, diarrhea, weakness, dizziness, syncope.    Pt was at gastroenterology office this AM and was sent here 2/2 to tachypnea and SOB.    PCP/Cardiology - [name] GI: [facility]   Past Medical History  Diagnosis Date  . CHF (congestive heart failure)   . Sarcoidosis   . Cardiomyopathy, dilated, nonischemic     non ischemic by cath  . Acute on chronic systolic heart failure   . Automatic implantable cardiac defibrillator in situ   . Atrial fibrillation   . NSVT (nonsustained ventricular tachycardia)   . GERD (gastroesophageal reflux disease)   . Hypercholesteremia   . Myocardial infarction   . Shortness of breath   . Chronic kidney disease (CKD), stage III (moderate)   . Pacemaker   . Anginal pain   . Gout   . Hypertension     [name]  . Coronary artery disease     Past  Surgical History  Procedure Date  . Back surgery 1987    Ruptured disk repair  . Pacemaker insertion     with ICD  . Tee without cardioversion 01/17/2011    Procedure: TRANSESOPHAGEAL ECHOCARDIOGRAM (TEE);  Surgeon: Ricki Rodriguez, MD;  Location: [facility] ENDOSCOPY;  Service: Cardiovascular;  Laterality: N/A;  . Cardioversion 01/17/2011    Procedure: CARDIOVERSION;  Surgeon: Ricki Rodriguez, MD;  Location: [facility] ENDOSCOPY;  Service: Cardiovascular;  Laterality: N/A;  . Cardiac catheterization   . Insert / replace / remove pacemaker   . Anterior cervical decomp/discectomy fusion 08/21/2011    Procedure: ANTERIOR CERVICAL DECOMPRESSION/DISCECTOMY FUSION 2 LEVELS;  Surgeon: Eldred Manges, MD;  Location: [facility] OR;  Service: Orthopedics;  Laterality: N/A;  C5-6, C6-7 Anterior Cervical Discectomy and Fusion, allograft, plate    Family History  Problem Relation Age of Onset  . Heart disease    . Heart failure    . Stroke    . Anesthesia problems Neg Hx   . Hypotension Neg Hx   . Malignant hyperthermia Neg Hx   . Pseudochol deficiency Neg Hx     History  Substance Use Topics  . Smoking status: Never Smoker   . Smokeless tobacco: Never Used  . Alcohol Use: No      Review of Systems  Constitutional: Negative for fever, diaphoresis, appetite change, fatigue and unexpected weight change.  HENT: Negative for mouth sores and neck  stiffness.   Eyes: Negative for visual disturbance.  Respiratory: Positive for shortness of breath. Negative for cough, chest tightness and wheezing.   Cardiovascular: Positive for chest pain.  Gastrointestinal: Positive for nausea, vomiting and abdominal pain. Negative for diarrhea and constipation.  Genitourinary: Negative for dysuria, urgency, frequency and hematuria.  Skin: Negative for rash.  Neurological: Negative for syncope, light-headedness and headaches.  Psychiatric/Behavioral: Negative for sleep disturbance. The patient is not nervous/anxious.   All other systems  reviewed and are negative.    Allergies  Review of patient's allergies indicates no known allergies.  Home Medications   Current Outpatient Rx  Name  Route  Sig  Dispense  Refill  . ALLOPURINOL 300 MG PO TABS   Oral   Take 300 mg by mouth daily.         . AMIODARONE HCL 200 MG PO TABS   Oral   Take 1 tablet (200 mg total) by mouth daily.         Marland Kitchen CARVEDILOL 3.125 MG PO TABS   Oral   Take 1 tablet (3.125 mg total) by mouth 2 (two) times daily.   60 tablet   3   . FUROSEMIDE 80 MG PO TABS   Oral   Take 120 mg by mouth 2 (two) times daily.          . ISOSORB DINITRATE-HYDRALAZINE 20-37.5 MG PO TABS   Oral   Take 1 tablet by mouth 2 (two) times daily.          Marland Kitchen PANTOPRAZOLE SODIUM 40 MG PO TBEC   Oral   Take 40 mg by mouth daily.         Marland Kitchen ROSUVASTATIN CALCIUM 5 MG PO TABS   Oral   Take 1 tablet (5 mg total) by mouth at bedtime.         . WARFARIN SODIUM 10 MG PO TABS   Oral   Take 10 mg by mouth every evening.          Marland Kitchen ZOLPIDEM TARTRATE 5 MG PO TABS   Oral   Take 10 mg by mouth at bedtime as needed. For sleep           BP 118/90  Pulse 87  Temp 97.7 F (36.5 C) (Oral)  Resp 18  SpO2 99%  Physical Exam  Nursing note and vitals reviewed. Constitutional: He appears well-developed and well-nourished. No distress.  HENT:  Head: Normocephalic and atraumatic.  Mouth/Throat: Oropharynx is clear and moist. No oropharyngeal exudate.  Eyes: Conjunctivae normal are normal. No scleral icterus.  Neck: Normal range of motion. Neck supple.  Cardiovascular: Normal rate, regular rhythm and intact distal pulses.   Pulmonary/Chest: Effort normal and breath sounds normal. No respiratory distress. He has no wheezes.  Abdominal: Soft. Bowel sounds are normal. He exhibits no mass. There is no tenderness. There is no rebound and no guarding.  Musculoskeletal: Normal range of motion. He exhibits no edema.  Neurological: He is alert.       Speech is clear  and goal oriented Moves extremities without ataxia  Skin: Skin is warm and dry. He is not diaphoretic.  Psychiatric: He has a normal mood and affect.    ED Course  Procedures (including critical care time)  Labs Reviewed  CBC - Abnormal; Notable for the following:    RBC 4.17 (*)     Hemoglobin 12.2 (*)     HCT 38.3 (*)     All other components within normal  limits  PRO B NATRIURETIC PEPTIDE - Abnormal; Notable for the following:    Pro B Natriuretic peptide (BNP) 6109.0 (*)     All other components within normal limits  COMPREHENSIVE METABOLIC PANEL - Abnormal; Notable for the following:    Glucose, Bld 111 (*)     BUN 41 (*)     Creatinine, Ser 2.51 (*)     Albumin 3.3 (*)     AST 71 (*)     GFR calc non Af Amer 26 (*)     GFR calc Af Amer 30 (*)     All other components within normal limits  POCT I-STAT TROPONIN I  LIPASE, BLOOD  POCT I-STAT TROPONIN I   Dg Chest 2 View  01/15/2012  *RADIOLOGY REPORT*  Clinical Data: Chest pain  CHEST - 2 VIEW  Comparison: 01/09/2012  Findings: Moderate cardiomegaly.  Left subclavian AICD device and leads are stable.  No new consolidation or mass.  No pneumothorax. No pleural effusion.  Normal vascularity.  IMPRESSION: Cardiomegaly without decompensation.  Stable left subclavian AICD device.   Original Report Authenticated By: Jolaine Click, M.D.    ECG:  Date: 01/15/2012  Rate: 97  Rhythm: atrial fibrillation  QRS Axis: left  Intervals: QT prolonged and PR unmeasurable   ST/T Wave abnormalities: nonspecific T wave changes  Conduction Disutrbances:none  Narrative Interpretation: T wave flattening in V3 changed from previous, t wave inversion V5-V6 unchanged from previous, no evidence of STEMI  Old EKG Reviewed: changes noted     1. Epigastric pain   2. Chronic systolic CHF (congestive heart failure), NYHA class 2   3. Cardiomyopathy, dilated, nonischemic   4. Atrial fibrillation   5. HYPERTENSION   6. GASTROESOPHAGEAL REFLUX  DISEASE   7. CHRONIC KIDNEY DISEASE STAGE II (MILD)       MDM  Hazael J Lipuma presents with recurrent abdominal pain radiating into his chest. Evaluated on 01/09/2012 for A. fib with RVR, given diltiazem and discharged home.  Patient hospitalized second week of December with extensive abdominal and cardiac evaluations and discharged home.  Patient with elevated BUN and creatinine to baseline with a history of CTD, CBC with a hemoglobin of 12.2 and history of chronic anemia, lipase unremarkable, chest x-ray with cardiomegaly without decompensation no evidence of pleural effusion, edema or consolidation.  BNP 06/03/2007 with history of dilated cardiomyopathy.  I discussed patient with Dr. [name] who states that the abdominal pain is chronic for the patient has been worked up many times. From his cardiology standpoint the patient is stable to be discharged home as he is not in A. fib with RVR, has no evidence of acute CHF and chest pain resolved with abdominal pain.  Patient with negative delta troponin.  Patient remains non-tachycardic, NAD, nonseptic, nontoxic appearing.  His pain is adequately controlled here in the department.  He becomes very anxious when we discussed discharge home, stating that no one can figure out why his abdomen hurts.  Will give Ativan here and have him followup with gastroenterology for his abdominal pain and primary care for his anxiety.  Pt feeling much better after ativan and is amenable to discharge.  I discussed the need to talk to [name] about his anxiety and he agrees.    Patient is nontoxic, nonseptic appearing, in no apparent distress.  Patient's pain and other symptoms adequately managed in emergency department.  Labs and vitals reviewed; do not feel the patient warrants imaging at this time..  Patient does  not meet the SIRS or Sepsis criteria.  On repeat exam patient does not have a surgical abdomin and there are nor peritoneal signs.  No concern for  appendicitis, bowel obstruction, bowel perforation, cholecystitis, diverticulitis.  Patient discharged home with symptomatic treatment and given strict instructions for follow-up with their primary care physician.  I have also discussed reasons to return immediately to the ER.  Patient expresses understanding and agrees with plan.  Dr. Mancel Bale was consulted and agrees with the plan.     1. Medications: ativan, percocet, miralax, usual home medications 2. Treatment: rest, drink plenty of fluids, take medication as prescribed, if you take the Percocet for pain she take MiraLax to help with bowel movements 3. Follow Up: Please followup with your primary doctor for discussion of your diagnoses and further evaluation after today's visit; followup with Dr. [name] and [facility] gastroenterology further evaluation of your abdominal pain.   Dahlia Client Daneesha Quinteros, PA-C 01/15/12 2024

## 2012-01-15 NOTE — ED Notes (Signed)
Pt st's he's feeling anxious about everything, st's he's been having panic attacks at home.  Notified ED PA Dahlia Client, pt to be given ativan and discharged home.

## 2012-01-16 NOTE — ED Provider Notes (Signed)
Medical screening examination/treatment/procedure(s) were performed by non-physician practitioner and as supervising physician I was immediately available for consultation/collaboration.   Flint Melter, MD 01/16/12 0100

## 2012-01-18 ENCOUNTER — Emergency Department (HOSPITAL_COMMUNITY): Payer: PRIVATE HEALTH INSURANCE

## 2012-01-18 ENCOUNTER — Emergency Department (HOSPITAL_COMMUNITY)
Admission: EM | Admit: 2012-01-18 | Discharge: 2012-01-19 | Disposition: A | Discharge status: Home / Self Care | Payer: PRIVATE HEALTH INSURANCE | Attending: Emergency Medicine | Admitting: Emergency Medicine

## 2012-01-18 ENCOUNTER — Encounter (HOSPITAL_COMMUNITY): Payer: Self-pay | Admitting: Emergency Medicine

## 2012-01-18 DIAGNOSIS — Z7901 Long term (current) use of anticoagulants: Secondary | ICD-10-CM | POA: Insufficient documentation

## 2012-01-18 DIAGNOSIS — Z95810 Presence of automatic (implantable) cardiac defibrillator: Secondary | ICD-10-CM | POA: Insufficient documentation

## 2012-01-18 DIAGNOSIS — Z8679 Personal history of other diseases of the circulatory system: Secondary | ICD-10-CM | POA: Insufficient documentation

## 2012-01-18 DIAGNOSIS — M109 Gout, unspecified: Secondary | ICD-10-CM | POA: Insufficient documentation

## 2012-01-18 DIAGNOSIS — K219 Gastro-esophageal reflux disease without esophagitis: Secondary | ICD-10-CM | POA: Insufficient documentation

## 2012-01-18 DIAGNOSIS — Z8619 Personal history of other infectious and parasitic diseases: Secondary | ICD-10-CM | POA: Insufficient documentation

## 2012-01-18 DIAGNOSIS — Z79899 Other long term (current) drug therapy: Secondary | ICD-10-CM | POA: Insufficient documentation

## 2012-01-18 DIAGNOSIS — I129 Hypertensive chronic kidney disease with stage 1 through stage 4 chronic kidney disease, or unspecified chronic kidney disease: Secondary | ICD-10-CM | POA: Insufficient documentation

## 2012-01-18 DIAGNOSIS — Z950 Presence of cardiac pacemaker: Secondary | ICD-10-CM | POA: Insufficient documentation

## 2012-01-18 DIAGNOSIS — Z8709 Personal history of other diseases of the respiratory system: Secondary | ICD-10-CM | POA: Insufficient documentation

## 2012-01-18 DIAGNOSIS — R109 Unspecified abdominal pain: Secondary | ICD-10-CM

## 2012-01-18 DIAGNOSIS — R1084 Generalized abdominal pain: Secondary | ICD-10-CM | POA: Insufficient documentation

## 2012-01-18 DIAGNOSIS — N183 Chronic kidney disease, stage 3 unspecified: Secondary | ICD-10-CM | POA: Insufficient documentation

## 2012-01-18 DIAGNOSIS — I479 Paroxysmal tachycardia, unspecified: Secondary | ICD-10-CM | POA: Insufficient documentation

## 2012-01-18 DIAGNOSIS — D869 Sarcoidosis, unspecified: Secondary | ICD-10-CM | POA: Insufficient documentation

## 2012-01-18 DIAGNOSIS — I4891 Unspecified atrial fibrillation: Secondary | ICD-10-CM | POA: Insufficient documentation

## 2012-01-18 DIAGNOSIS — R112 Nausea with vomiting, unspecified: Secondary | ICD-10-CM | POA: Insufficient documentation

## 2012-01-18 DIAGNOSIS — I2510 Atherosclerotic heart disease of native coronary artery without angina pectoris: Secondary | ICD-10-CM | POA: Insufficient documentation

## 2012-01-18 DIAGNOSIS — I5023 Acute on chronic systolic (congestive) heart failure: Secondary | ICD-10-CM | POA: Insufficient documentation

## 2012-01-18 DIAGNOSIS — E7800 Pure hypercholesterolemia, unspecified: Secondary | ICD-10-CM | POA: Insufficient documentation

## 2012-01-18 DIAGNOSIS — I252 Old myocardial infarction: Secondary | ICD-10-CM | POA: Insufficient documentation

## 2012-01-18 LAB — PROTIME-INR: INR: 1.73 — ABNORMAL HIGH (ref 0.00–1.49)

## 2012-01-18 LAB — CBC WITH DIFFERENTIAL/PLATELET
Basophils Absolute: 0 10*3/uL (ref 0.0–0.1)
HCT: 41 % (ref 39.0–52.0)
Hemoglobin: 13.8 g/dL (ref 13.0–17.0)
Lymphocytes Relative: 40 % (ref 12–46)
Monocytes Absolute: 0.7 10*3/uL (ref 0.1–1.0)
Neutro Abs: 2.6 10*3/uL (ref 1.7–7.7)
RDW: 14.5 % (ref 11.5–15.5)
WBC: 5.8 10*3/uL (ref 4.0–10.5)

## 2012-01-18 LAB — COMPREHENSIVE METABOLIC PANEL
ALT: 55 U/L — ABNORMAL HIGH (ref 0–53)
AST: 82 U/L — ABNORMAL HIGH (ref 0–37)
CO2: 21 mEq/L (ref 19–32)
Chloride: 107 mEq/L (ref 96–112)
Creatinine, Ser: 2.16 mg/dL — ABNORMAL HIGH (ref 0.50–1.35)
GFR calc non Af Amer: 31 mL/min — ABNORMAL LOW (ref >90)
Total Bilirubin: 1.1 mg/dL (ref 0.3–1.2)

## 2012-01-18 MED ORDER — FUROSEMIDE 10 MG/ML IJ SOLN
40.0000 mg | Freq: Once | INTRAMUSCULAR | Status: DC
  Filled 2012-01-18: qty 4

## 2012-01-18 MED ORDER — MAGNESIUM CITRATE PO SOLN
1.0000 | Freq: Once | ORAL | Status: AC
  Administered 2012-01-18: 1 via ORAL
  Filled 2012-01-18: qty 296

## 2012-01-18 MED ORDER — ONDANSETRON HCL 4 MG/2ML IJ SOLN
4.0000 mg | Freq: Once | INTRAMUSCULAR | Status: DC
  Filled 2012-01-18: qty 2

## 2012-01-18 MED ORDER — MORPHINE SULFATE 4 MG/ML IJ SOLN
4.0000 mg | Freq: Once | INTRAMUSCULAR | Status: DC
  Filled 2012-01-18: qty 1

## 2012-01-18 NOTE — ED Notes (Signed)
[id number]<AV> Expected date:<BR> Expected time:<BR> Means of arrival:<BR> Comments:<BR> EMS-62 yo male with constipation for 2 weeks-abdominal distention and nausea

## 2012-01-18 NOTE — ED Notes (Signed)
Unsuccessfully attempted to draw labs.  Let phleo know labs still need to be collected.

## 2012-01-18 NOTE — ED Provider Notes (Signed)
History     CSN: [id number]  Arrival date & time 01/18/12  2005   First MD Initiated Contact with Patient 01/18/12 2007      Chief Complaint  Patient presents with  . Constipation   HPI  History provided by the patient. Patient is a 62 year old African American male with history of hypertension, hypercholesterolemia, sarcoidosis, CAD, CHF who presents with complaints of constipation and diffuse abdominal discomforts. Patient states he has not had a significant regular bowel movement for the past 2 weeks. He reports occasional small amounts of stool or occasionally liquid. He denies any blood in stool. There is occasional rectal discomfort and pressure. Over the past few days patient reports having increased abdominal distention with nausea and occasional vomiting symptoms. He reports having difficulty eating any foods due to discomforts. Patient has tried several enemas, daily stool softeners and laxatives without improvement. Patient denies having similar symptoms previously. He has no history of abdominal surgery. No history of past colonoscopy. Patient is occasionally taking narcotic pain medication.    Past Medical History  Diagnosis Date  . CHF (congestive heart failure)   . Sarcoidosis   . Cardiomyopathy, dilated, nonischemic     non ischemic by cath  . Acute on chronic systolic heart failure   . Automatic implantable cardiac defibrillator in situ   . Atrial fibrillation   . NSVT (nonsustained ventricular tachycardia)   . GERD (gastroesophageal reflux disease)   . Hypercholesteremia   . Myocardial infarction   . Shortness of breath   . Chronic kidney disease (CKD), stage III (moderate)   . Pacemaker   . Anginal pain   . Gout   . Hypertension     [name]  . Coronary artery disease     Past Surgical History  Procedure Date  . Back surgery 1987    Ruptured disk repair  . Pacemaker insertion     with ICD  . Tee without cardioversion 01/17/2011    Procedure:  TRANSESOPHAGEAL ECHOCARDIOGRAM (TEE);  Surgeon: Ricki Rodriguez, MD;  Location: [facility] ENDOSCOPY;  Service: Cardiovascular;  Laterality: N/A;  . Cardioversion 01/17/2011    Procedure: CARDIOVERSION;  Surgeon: Ricki Rodriguez, MD;  Location: [facility] ENDOSCOPY;  Service: Cardiovascular;  Laterality: N/A;  . Cardiac catheterization   . Insert / replace / remove pacemaker   . Anterior cervical decomp/discectomy fusion 08/21/2011    Procedure: ANTERIOR CERVICAL DECOMPRESSION/DISCECTOMY FUSION 2 LEVELS;  Surgeon: Eldred Manges, MD;  Location: [facility] OR;  Service: Orthopedics;  Laterality: N/A;  C5-6, C6-7 Anterior Cervical Discectomy and Fusion, allograft, plate    Family History  Problem Relation Age of Onset  . Heart disease    . Heart failure    . Stroke    . Anesthesia problems Neg Hx   . Hypotension Neg Hx   . Malignant hyperthermia Neg Hx   . Pseudochol deficiency Neg Hx     History  Substance Use Topics  . Smoking status: Never Smoker   . Smokeless tobacco: Never Used  . Alcohol Use: No      Review of Systems  Constitutional: Negative for fever.  Respiratory: Negative for shortness of breath.   Cardiovascular: Negative for chest pain.  Gastrointestinal: Positive for nausea, vomiting, abdominal pain and constipation. Negative for blood in stool.  Genitourinary: Negative for dysuria, frequency and flank pain.  All other systems reviewed and are negative.    Allergies  Review of patient's allergies indicates no known allergies.  Home Medications   Current  Outpatient Rx  Name  Route  Sig  Dispense  Refill  . ALLOPURINOL 300 MG PO TABS   Oral   Take 300 mg by mouth daily.         . AMIODARONE HCL 200 MG PO TABS   Oral   Take 1 tablet (200 mg total) by mouth daily.         Marland Kitchen CARVEDILOL 3.125 MG PO TABS   Oral   Take 1 tablet (3.125 mg total) by mouth 2 (two) times daily.   60 tablet   3   . FUROSEMIDE 80 MG PO TABS   Oral   Take 120 mg by mouth 2 (two) times daily.            . ISOSORB DINITRATE-HYDRALAZINE 20-37.5 MG PO TABS   Oral   Take 1 tablet by mouth 2 (two) times daily.          Marland Kitchen LORAZEPAM 1 MG PO TABS   Oral   Take 0.5 tablets (0.5 mg total) by mouth 2 (two) times daily as needed for anxiety.   15 tablet   0   . OXYCODONE-ACETAMINOPHEN 5-325 MG PO TABS   Oral   Take 1 tablet by mouth every 4 (four) hours as needed for pain.   20 tablet   0   . PANTOPRAZOLE SODIUM 40 MG PO TBEC   Oral   Take 40 mg by mouth daily.         Marland Kitchen POLYETHYLENE GLYCOL 3350 PO POWD   Oral   Take 17 g by mouth 2 (two) times daily. Until daily soft stools  OTC   255 g   0   . ROSUVASTATIN CALCIUM 5 MG PO TABS   Oral   Take 1 tablet (5 mg total) by mouth at bedtime.         . WARFARIN SODIUM 10 MG PO TABS   Oral   Take 10 mg by mouth every evening.          Marland Kitchen ZOLPIDEM TARTRATE 5 MG PO TABS   Oral   Take 10 mg by mouth at bedtime as needed. For sleep           BP 126/93  Temp 97.4 F (36.3 C) (Oral)  Resp 20  Ht 6\' 3"  (1.905 m)  Wt 232 lb (105.235 kg)  BMI 29.00 kg/m2  SpO2 98%  Physical Exam  Nursing note and vitals reviewed. Constitutional: He is oriented to person, place, and time. He appears well-developed and well-nourished. No distress.  HENT:  Head: Normocephalic.  Cardiovascular: Normal rate and regular rhythm.   Pulmonary/Chest: Effort normal and breath sounds normal.  Abdominal: Soft. He exhibits distension. He exhibits no mass. Bowel sounds are decreased. There is tenderness. There is no rebound and no guarding.       Mild distention.  Diffuse mild-moderate tenderness.  Musculoskeletal: Normal range of motion.  Neurological: He is alert and oriented to person, place, and time.  Skin: Skin is warm.  Psychiatric: He has a normal mood and affect. His behavior is normal.    ED Course  Procedures   Results for orders placed during the hospital encounter of 01/18/12  CBC WITH DIFFERENTIAL      Component Value Range    WBC 5.8  4.0 - 10.5 K/uL   RBC 4.45  4.22 - 5.81 MIL/uL   Hemoglobin 13.8  13.0 - 17.0 g/dL   HCT 45.4  09.8 - 11.9 %  MCV 92.1  78.0 - 100.0 fL   MCH 31.0  26.0 - 34.0 pg   MCHC 33.7  30.0 - 36.0 g/dL   RDW 45.4  09.8 - 11.9 %   Platelets 230  150 - 400 K/uL   Neutrophils Relative 45  43 - 77 %   Neutro Abs 2.6  1.7 - 7.7 K/uL   Lymphocytes Relative 40  12 - 46 %   Lymphs Abs 2.3  0.7 - 4.0 K/uL   Monocytes Relative 12  3 - 12 %   Monocytes Absolute 0.7  0.1 - 1.0 K/uL   Eosinophils Relative 3  0 - 5 %   Eosinophils Absolute 0.2  0.0 - 0.7 K/uL   Basophils Relative 0  0 - 1 %   Basophils Absolute 0.0  0.0 - 0.1 K/uL  COMPREHENSIVE METABOLIC PANEL      Component Value Range   Sodium 140  135 - 145 mEq/L   Potassium 3.9  3.5 - 5.1 mEq/L   Chloride 107  96 - 112 mEq/L   CO2 21  19 - 32 mEq/L   Glucose, Bld 97  70 - 99 mg/dL   BUN 33 (*) 6 - 23 mg/dL   Creatinine, Ser 1.47 (*) 0.50 - 1.35 mg/dL   Calcium 82.9  8.4 - 56.2 mg/dL   Total Protein 8.4 (*) 6.0 - 8.3 g/dL   Albumin 3.2 (*) 3.5 - 5.2 g/dL   AST 82 (*) 0 - 37 U/L   ALT 55 (*) 0 - 53 U/L   Alkaline Phosphatase 80  39 - 117 U/L   Total Bilirubin 1.1  0.3 - 1.2 mg/dL   GFR calc non Af Amer 31 (*) >90 mL/min   GFR calc Af Amer 36 (*) >90 mL/min  PROTIME-INR      Component Value Range   Prothrombin Time 19.7 (*) 11.6 - 15.2 seconds   INR 1.73 (*) 0.00 - 1.49  PRO B NATRIURETIC PEPTIDE      Component Value Range   Pro B Natriuretic peptide (BNP) 4235.0 (*) 0 - 125 pg/mL       Ct Abdomen Pelvis Wo Contrast  01/19/2012  *RADIOLOGY REPORT*  Clinical Data: Constipation and abdominal distension for 2 weeks. Pain, nausea, and vomiting.  Tarry stools.  CT ABDOMEN AND PELVIS WITHOUT CONTRAST  Technique:  Multidetector CT imaging of the abdomen and pelvis was performed following the standard protocol without intravenous contrast.  Comparison: [facility] CT abdomen and pelvis 01/09/2012.  Findings: There is a small  moderate sized right pleural effusion with mild basilar atelectasis.  Diffuse cardiac enlargement.  The unenhanced appearance of the liver, spleen, gallbladder, pancreas, adrenal glands, abdominal aorta, and retroperitoneal lymph nodes is unremarkable.  There is a hyperdense rounded structure adjacent to the mid pole of the right kidney centrally, measuring 2.1 cm diameter.  This may represent a hemorrhagic cyst or perhaps renal artery aneurysm.  This is stable since the previous study.  Again, I elected MRI evaluation should be considered.  Hydronephrosis in either kidney.  The stomach, small bowel, and colon are not abnormally distended.  No free air or free fluid in the abdomen.  Contrast material flows to the cecum without evidence of bowel obstruction.  Stool and liquid stool filled colon.  Pelvis:  Prostate gland is not enlarged.  Bladder wall is not thickened.  No free or loculated pelvic fluid collections.  No significant pelvic lymphadenopathy.  No evidence of diverticulitis. The appendix  is normal.  Degenerative changes in the lumbar spine. No destructive bone lesions appreciated.  No significant changes since the previous study.  IMPRESSION: No acute process demonstrated in the abdomen or pelvis.  Stable appearance since previous study.  Right pleural effusion with basilar atelectasis.  Hyperdense rounded structure in the right renal hilum could represent hemorrhagic cyst or renal artery aneurysm.  Consider further evaluation with elective MRI.   Original Report Authenticated By: Burman Nieves, M.D.    Dg Abd Acute W/chest  01/18/2012  *RADIOLOGY REPORT*  Clinical Data: Abdominal pain and constipation for 2 weeks.  Chest pain.  ACUTE ABDOMEN SERIES (ABDOMEN 2 VIEW & CHEST 1 VIEW)  Comparison: Chest 01/15/2012.  CT abdomen and pelvis 01/09/2012.  Findings: Stable appearance of cardiac pacemaker.  Cardiac enlargement with mild pulmonary vascular congestion vascular congestion is developing since  previous study.  No definite edema. No blunting of costophrenic angles.  No pneumothorax.  No focal consolidation.  Minimal scattered gas and stool in the colon.  No free intra- abdominal air.  No abnormal air fluid levels.  No radiopaque stones.  Mild degenerative changes in the spine.  IMPRESSION: Cardiac enlargement with developing pulmonary vascular congestion. Nonobstructive bowel gas pattern.   Original Report Authenticated By: Burman Nieves, M.D.      1. Abdominal pain       MDM  8:25PM patient seen and evaluated. Patient appears in mild to moderate discomfort.  Patient reports having improvement of symptoms after medications.   Patient's labs have been unremarkable. He continues to have some abdominal pain and discomfort. We'll plan to obtain CT scan.   CT unremarkable without any changes to mass and right kidney. Patient is aware of this finding and understands the need for followup with possible additional imaging of the MRI. At this time patient sees to feel improved and it is not appear to be any concerning medical condition at this time. Patient instructed to followup with his primary care provider patient. He agrees with this plan.     Angus Seller, [place] 01/20/12 [phone]

## 2012-01-18 NOTE — ED Notes (Signed)
Pt to radiology.

## 2012-01-18 NOTE — ED Notes (Signed)
Pt states constipation x2 weeks with abdominal distention, pain and n/v.  Pt states small black tarry stools when he is able to have a BM. Pt states he has had "enemas, suppositories, powders and prune juice."

## 2012-01-18 NOTE — ED Notes (Signed)
IV from IV team infiltrated.  IV team paged and stated that she used the ultrasound to find this one and the only other one is too deep.  EDP to be made aware.

## 2012-01-18 NOTE — ED Notes (Addendum)
Paged IV team for IV, 2 unsuccessful IV attempts at this time.

## 2012-01-18 NOTE — ED Notes (Signed)
Patient is resting comfortably. 

## 2012-01-19 MED ORDER — ONDANSETRON 8 MG PO TBDP
8.0000 mg | ORAL_TABLET | Freq: Once | ORAL | Status: AC
  Administered 2012-01-19: 8 mg via ORAL

## 2012-01-19 MED ORDER — MORPHINE SULFATE 4 MG/ML IJ SOLN
6.0000 mg | Freq: Once | INTRAMUSCULAR | Status: AC
  Administered 2012-01-19: 6 mg via INTRAMUSCULAR
  Filled 2012-01-19: qty 2

## 2012-01-19 MED ORDER — GI COCKTAIL ~~LOC~~
30.0000 mL | Freq: Once | ORAL | Status: AC
  Administered 2012-01-19: 30 mL via ORAL
  Filled 2012-01-19: qty 30

## 2012-01-19 MED ORDER — ONDANSETRON 8 MG PO TBDP: ORAL_TABLET | ORAL | Status: DC

## 2012-01-19 MED ORDER — OXYCODONE-ACETAMINOPHEN 5-325 MG PO TABS: 1.0000 | ORAL_TABLET | ORAL | Status: DC | PRN

## 2012-01-19 MED ORDER — PREDNISONE 10 MG PO TABS: 20.0000 mg | ORAL_TABLET | Freq: Every day | ORAL | Status: DC

## 2012-01-19 MED ORDER — ONDANSETRON 8 MG PO TBDP
ORAL_TABLET | ORAL | Status: AC
  Filled 2012-01-19: qty 1

## 2012-01-19 MED ORDER — ONDANSETRON 8 MG PO TBDP
8.0000 mg | ORAL_TABLET | Freq: Once | ORAL | Status: AC
  Administered 2012-01-19: 8 mg via ORAL
  Filled 2012-01-19: qty 1

## 2012-01-19 MED ORDER — DEXAMETHASONE SODIUM PHOSPHATE 10 MG/ML IJ SOLN
10.0000 mg | Freq: Once | INTRAMUSCULAR | Status: AC
  Administered 2012-01-19: 10 mg via INTRAMUSCULAR
  Filled 2012-01-19: qty 1

## 2012-01-19 NOTE — ED Notes (Signed)
Spoke to radiology about pt's CT, stated they should be with him in about 15 minutes.

## 2012-01-19 NOTE — ED Notes (Signed)
Spoke with PA about loss of IV access, stated medications will be changed and to call radiology about pt's contrast dye.

## 2012-01-19 NOTE — ED Notes (Signed)
Pt back from CT at this time 

## 2012-01-19 NOTE — ED Notes (Signed)
Pt states he experienced and episode of diarrhea after the CT that "relieved some of the pressure" but states he still feels "blocked up."

## 2012-01-20 ENCOUNTER — Emergency Department (HOSPITAL_COMMUNITY): Payer: PRIVATE HEALTH INSURANCE

## 2012-01-20 ENCOUNTER — Encounter (HOSPITAL_COMMUNITY): Payer: Self-pay | Admitting: Nurse Practitioner

## 2012-01-20 ENCOUNTER — Emergency Department (HOSPITAL_COMMUNITY)
Admission: EM | Admit: 2012-01-20 | Discharge: 2012-01-20 | Disposition: A | Discharge status: Home / Self Care | Payer: PRIVATE HEALTH INSURANCE | Attending: Emergency Medicine | Admitting: Emergency Medicine

## 2012-01-20 DIAGNOSIS — N183 Chronic kidney disease, stage 3 unspecified: Secondary | ICD-10-CM | POA: Insufficient documentation

## 2012-01-20 DIAGNOSIS — M109 Gout, unspecified: Secondary | ICD-10-CM | POA: Insufficient documentation

## 2012-01-20 DIAGNOSIS — I2510 Atherosclerotic heart disease of native coronary artery without angina pectoris: Secondary | ICD-10-CM | POA: Insufficient documentation

## 2012-01-20 DIAGNOSIS — R109 Unspecified abdominal pain: Secondary | ICD-10-CM

## 2012-01-20 DIAGNOSIS — Z7901 Long term (current) use of anticoagulants: Secondary | ICD-10-CM | POA: Insufficient documentation

## 2012-01-20 DIAGNOSIS — Z79899 Other long term (current) drug therapy: Secondary | ICD-10-CM | POA: Insufficient documentation

## 2012-01-20 DIAGNOSIS — Z8679 Personal history of other diseases of the circulatory system: Secondary | ICD-10-CM | POA: Insufficient documentation

## 2012-01-20 DIAGNOSIS — Z95810 Presence of automatic (implantable) cardiac defibrillator: Secondary | ICD-10-CM | POA: Insufficient documentation

## 2012-01-20 DIAGNOSIS — R1084 Generalized abdominal pain: Secondary | ICD-10-CM | POA: Insufficient documentation

## 2012-01-20 DIAGNOSIS — R0602 Shortness of breath: Secondary | ICD-10-CM | POA: Insufficient documentation

## 2012-01-20 DIAGNOSIS — E7800 Pure hypercholesterolemia, unspecified: Secondary | ICD-10-CM | POA: Insufficient documentation

## 2012-01-20 DIAGNOSIS — K5900 Constipation, unspecified: Secondary | ICD-10-CM | POA: Insufficient documentation

## 2012-01-20 DIAGNOSIS — I252 Old myocardial infarction: Secondary | ICD-10-CM | POA: Insufficient documentation

## 2012-01-20 DIAGNOSIS — I5023 Acute on chronic systolic (congestive) heart failure: Secondary | ICD-10-CM | POA: Insufficient documentation

## 2012-01-20 DIAGNOSIS — K219 Gastro-esophageal reflux disease without esophagitis: Secondary | ICD-10-CM | POA: Insufficient documentation

## 2012-01-20 DIAGNOSIS — R079 Chest pain, unspecified: Secondary | ICD-10-CM | POA: Insufficient documentation

## 2012-01-20 DIAGNOSIS — I129 Hypertensive chronic kidney disease with stage 1 through stage 4 chronic kidney disease, or unspecified chronic kidney disease: Secondary | ICD-10-CM | POA: Insufficient documentation

## 2012-01-20 DIAGNOSIS — Z950 Presence of cardiac pacemaker: Secondary | ICD-10-CM | POA: Insufficient documentation

## 2012-01-20 LAB — COMPREHENSIVE METABOLIC PANEL
ALT: 45 U/L (ref 0–53)
Alkaline Phosphatase: 77 U/L (ref 39–117)
BUN: 49 mg/dL — ABNORMAL HIGH (ref 6–23)
CO2: 19 mEq/L (ref 19–32)
Chloride: 103 mEq/L (ref 96–112)
GFR calc Af Amer: 27 mL/min — ABNORMAL LOW (ref >90)
Glucose, Bld: 129 mg/dL — ABNORMAL HIGH (ref 70–99)
Potassium: 4.5 mEq/L (ref 3.5–5.1)
Sodium: 137 mEq/L (ref 135–145)
Total Bilirubin: 0.6 mg/dL (ref 0.3–1.2)

## 2012-01-20 LAB — CBC WITH DIFFERENTIAL/PLATELET
Hemoglobin: 13.3 g/dL (ref 13.0–17.0)
Lymphocytes Relative: 8 % — ABNORMAL LOW (ref 12–46)
Lymphs Abs: 1.2 10*3/uL (ref 0.7–4.0)
MCH: 30.5 pg (ref 26.0–34.0)
Monocytes Relative: 7 % (ref 3–12)
Neutro Abs: 12.3 10*3/uL — ABNORMAL HIGH (ref 1.7–7.7)
Neutrophils Relative %: 85 % — ABNORMAL HIGH (ref 43–77)
Platelets: 256 10*3/uL (ref 150–400)
RBC: 4.36 MIL/uL (ref 4.22–5.81)
WBC: 14.6 10*3/uL — ABNORMAL HIGH (ref 4.0–10.5)

## 2012-01-20 LAB — PRO B NATRIURETIC PEPTIDE: Pro B Natriuretic peptide (BNP): 7679 pg/mL — ABNORMAL HIGH (ref 0–125)

## 2012-01-20 LAB — LIPASE, BLOOD: Lipase: 51 U/L (ref 11–59)

## 2012-01-20 MED ORDER — PROMETHAZINE HCL 25 MG/ML IJ SOLN
25.0000 mg | Freq: Once | INTRAMUSCULAR | Status: AC
  Administered 2012-01-20: 25 mg via INTRAMUSCULAR
  Filled 2012-01-20: qty 1

## 2012-01-20 MED ORDER — HYDROMORPHONE HCL PF 1 MG/ML IJ SOLN
1.0000 mg | Freq: Once | INTRAMUSCULAR | Status: AC
  Administered 2012-01-20: 1 mg via INTRAMUSCULAR
  Filled 2012-01-20: qty 1

## 2012-01-20 MED ORDER — DICYCLOMINE HCL 20 MG PO TABS: 20.0000 mg | ORAL_TABLET | Freq: Two times a day (BID) | ORAL | Status: DC

## 2012-01-20 NOTE — ED Provider Notes (Signed)
Medical screening examination/treatment/procedure(s) were performed by non-physician practitioner and as supervising physician I was immediately available for consultation/collaboration.    Avaree Gilberti L Alli Jasmer, MD 01/20/12 1529 

## 2012-01-20 NOTE — ED Notes (Signed)
Pt discharged to home with family. NAD.  

## 2012-01-20 NOTE — ED Notes (Addendum)
Per ems: pt was seen here yesterday for abd pain and discharged home. Reports pain persisted and now radiating into chest and buttock. En route, ekg showed Afib, pt has history of Afib and takes coumadin for same. A&Ox4, VSS, resp e/u en route.

## 2012-01-20 NOTE — ED Notes (Signed)
Pt.transported to x rays

## 2012-01-20 NOTE — ED Provider Notes (Signed)
History     CSN: [id number]  Arrival date & time 01/20/12  1544   First MD Initiated Contact with Patient 01/20/12 1546      Chief Complaint  Patient presents with  . Abdominal Pain    (Consider location/radiation/quality/duration/timing/severity/associated sxs/prior treatment) HPI Comments: Patient presents to the ER for evaluation of abdominal pain. Patient reports that he has been experiencing pain since yesterday. Patient was seen at [facility] yesterday and had an extensive workup. He reports that no abnormalities were found he was discharged home. After getting home he started having increasing pain. Pain is sharp and goes from his testicles although to his chest. He says that he has so much distention of his abdomen that is making him feel like he is short of breath. Patient reports that he is very constipated. He has not had a normal bowel movement for 2 weeks.  Patient is a 62 y.o. male presenting with abdominal pain.  Abdominal Pain The primary symptoms of the illness include abdominal pain and shortness of breath.  Additional symptoms associated with the illness include constipation.    Past Medical History  Diagnosis Date  . CHF (congestive heart failure)   . Sarcoidosis   . Cardiomyopathy, dilated, nonischemic     non ischemic by cath  . Acute on chronic systolic heart failure   . Automatic implantable cardiac defibrillator in situ   . Atrial fibrillation   . NSVT (nonsustained ventricular tachycardia)   . GERD (gastroesophageal reflux disease)   . Hypercholesteremia   . Myocardial infarction   . Shortness of breath   . Chronic kidney disease (CKD), stage III (moderate)   . Pacemaker   . Anginal pain   . Gout   . Hypertension     [name]  . Coronary artery disease     Past Surgical History  Procedure Date  . Back surgery 1987    Ruptured disk repair  . Pacemaker insertion     with ICD  . Tee without cardioversion 01/17/2011    Procedure:  TRANSESOPHAGEAL ECHOCARDIOGRAM (TEE);  Surgeon: Ricki Rodriguez, MD;  Location: [facility] ENDOSCOPY;  Service: Cardiovascular;  Laterality: N/A;  . Cardioversion 01/17/2011    Procedure: CARDIOVERSION;  Surgeon: Ricki Rodriguez, MD;  Location: [facility] ENDOSCOPY;  Service: Cardiovascular;  Laterality: N/A;  . Cardiac catheterization   . Insert / replace / remove pacemaker   . Anterior cervical decomp/discectomy fusion 08/21/2011    Procedure: ANTERIOR CERVICAL DECOMPRESSION/DISCECTOMY FUSION 2 LEVELS;  Surgeon: Eldred Manges, MD;  Location: [facility] OR;  Service: Orthopedics;  Laterality: N/A;  C5-6, C6-7 Anterior Cervical Discectomy and Fusion, allograft, plate    Family History  Problem Relation Age of Onset  . Heart disease    . Heart failure    . Stroke    . Anesthesia problems Neg Hx   . Hypotension Neg Hx   . Malignant hyperthermia Neg Hx   . Pseudochol deficiency Neg Hx     History  Substance Use Topics  . Smoking status: Never Smoker   . Smokeless tobacco: Never Used  . Alcohol Use: No      Review of Systems  Respiratory: Positive for shortness of breath.   Cardiovascular: Positive for chest pain.  Gastrointestinal: Positive for abdominal pain, constipation and abdominal distention.  All other systems reviewed and are negative.    Allergies  Review of patient's allergies indicates no known allergies.  Home Medications   Current Outpatient Rx  Name  Route  Sig  Dispense  Refill  . ALLOPURINOL 300 MG PO TABS   Oral   Take 300 mg by mouth daily.         . AMIODARONE HCL 200 MG PO TABS   Oral   Take 1 tablet (200 mg total) by mouth daily.         Marland Kitchen CARVEDILOL 3.125 MG PO TABS   Oral   Take 1 tablet (3.125 mg total) by mouth 2 (two) times daily.   60 tablet   3   . FUROSEMIDE 80 MG PO TABS   Oral   Take 120 mg by mouth 2 (two) times daily.          . ISOSORB DINITRATE-HYDRALAZINE 20-37.5 MG PO TABS   Oral   Take 1 tablet by mouth 2 (two) times daily.          Marland Kitchen  LORAZEPAM 1 MG PO TABS   Oral   Take 0.5 tablets (0.5 mg total) by mouth 2 (two) times daily as needed for anxiety.   15 tablet   0   . ONDANSETRON 8 MG PO TBDP      8mg  ODT q4 hours prn nausea   20 tablet   0   . OXYCODONE-ACETAMINOPHEN 5-325 MG PO TABS   Oral   Take 1 tablet by mouth every 4 (four) hours as needed for pain.   5 tablet   0   . OXYCODONE-ACETAMINOPHEN 5-325 MG PO TABS   Oral   Take 1 tablet by mouth every 4 (four) hours as needed for pain.   20 tablet   0   . PANTOPRAZOLE SODIUM 40 MG PO TBEC   Oral   Take 40 mg by mouth daily.         Marland Kitchen POLYETHYLENE GLYCOL 3350 PO POWD   Oral   Take 17 g by mouth 2 (two) times daily. Until daily soft stools  OTC   255 g   0   . PREDNISONE 10 MG PO TABS   Oral   Take 2 tablets (20 mg total) by mouth daily.   10 tablet   0   . ROSUVASTATIN CALCIUM 5 MG PO TABS   Oral   Take 1 tablet (5 mg total) by mouth at bedtime.         . WARFARIN SODIUM 10 MG PO TABS   Oral   Take 10 mg by mouth every evening.            There were no vitals taken for this visit.  Physical Exam  Constitutional: He is oriented to person, place, and time. He appears well-developed and well-nourished. No distress.  HENT:  Head: Normocephalic and atraumatic.  Right Ear: Hearing normal.  Nose: Nose normal.  Mouth/Throat: Oropharynx is clear and moist and mucous membranes are normal.  Eyes: Conjunctivae normal and EOM are normal. Pupils are equal, round, and reactive to light.  Neck: Normal range of motion. Neck supple.  Cardiovascular: Normal rate, regular rhythm, S1 normal and S2 normal.  Exam reveals no gallop and no friction rub.   No murmur heard. Pulmonary/Chest: Effort normal and breath sounds normal. No respiratory distress. He exhibits no tenderness.  Abdominal: Soft. Normal appearance and bowel sounds are normal. There is no hepatosplenomegaly. There is generalized tenderness. There is no rebound, no guarding, no  tenderness at McBurney's point and negative Murphy's sign. No hernia.  Musculoskeletal: Normal range of motion.  Neurological: He is alert and oriented to person,  place, and time. He has normal strength. No cranial nerve deficit or sensory deficit. Coordination normal. GCS eye subscore is 4. GCS verbal subscore is 5. GCS motor subscore is 6.  Skin: Skin is warm, dry and intact. No rash noted. No cyanosis.  Psychiatric: He has a normal mood and affect. His speech is normal and behavior is normal. Thought content normal.    ED Course  Procedures (including critical care time)  Labs Reviewed  CBC WITH DIFFERENTIAL - Abnormal; Notable for the following:    WBC 14.6 (*)     Neutrophils Relative 85 (*)     Neutro Abs 12.3 (*)     Lymphocytes Relative 8 (*)     Monocytes Absolute 1.1 (*)     All other components within normal limits  COMPREHENSIVE METABOLIC PANEL - Abnormal; Notable for the following:    Glucose, Bld 129 (*)     BUN 49 (*)     Creatinine, Ser 2.72 (*)     Albumin 3.2 (*)     AST 58 (*)     GFR calc non Af Amer 23 (*)     GFR calc Af Amer 27 (*)     All other components within normal limits  PRO B NATRIURETIC PEPTIDE - Abnormal; Notable for the following:    Pro B Natriuretic peptide (BNP) 7679.0 (*)     All other components within normal limits  PROTIME-INR - Abnormal; Notable for the following:    Prothrombin Time 19.0 (*)     INR 1.65 (*)     All other components within normal limits  LIPASE, BLOOD  TROPONIN I   Ct Abdomen Pelvis Wo Contrast  01/19/2012  *RADIOLOGY REPORT*  Clinical Data: Constipation and abdominal distension for 2 weeks. Pain, nausea, and vomiting.  Tarry stools.  CT ABDOMEN AND PELVIS WITHOUT CONTRAST  Technique:  Multidetector CT imaging of the abdomen and pelvis was performed following the standard protocol without intravenous contrast.  Comparison: [facility] [place] CT abdomen and pelvis 01/09/2012.  Findings: There is a small moderate sized  right pleural effusion with mild basilar atelectasis.  Diffuse cardiac enlargement.  The unenhanced appearance of the liver, spleen, gallbladder, pancreas, adrenal glands, abdominal aorta, and retroperitoneal lymph nodes is unremarkable.  There is a hyperdense rounded structure adjacent to the mid pole of the right kidney centrally, measuring 2.1 cm diameter.  This may represent a hemorrhagic cyst or perhaps renal artery aneurysm.  This is stable since the previous study.  Again, I elected MRI evaluation should be considered.  Hydronephrosis in either kidney.  The stomach, small bowel, and colon are not abnormally distended.  No free air or free fluid in the abdomen.  Contrast material flows to the cecum without evidence of bowel obstruction.  Stool and liquid stool filled colon.  Pelvis:  Prostate gland is not enlarged.  Bladder wall is not thickened.  No free or loculated pelvic fluid collections.  No significant pelvic lymphadenopathy.  No evidence of diverticulitis. The appendix is normal.  Degenerative changes in the lumbar spine. No destructive bone lesions appreciated.  No significant changes since the previous study.  IMPRESSION: No acute process demonstrated in the abdomen or pelvis.  Stable appearance since previous study.  Right pleural effusion with basilar atelectasis.  Hyperdense rounded structure in the right renal hilum could represent hemorrhagic cyst or renal artery aneurysm.  Consider further evaluation with elective MRI.   Original Report Authenticated By: Burman Nieves, M.D.    Dg  Abd Acute W/chest  01/18/2012  *RADIOLOGY REPORT*  Clinical Data: Abdominal pain and constipation for 2 weeks.  Chest pain.  ACUTE ABDOMEN SERIES (ABDOMEN 2 VIEW & CHEST 1 VIEW)  Comparison: Chest 01/15/2012.  CT abdomen and pelvis 01/09/2012.  Findings: Stable appearance of cardiac pacemaker.  Cardiac enlargement with mild pulmonary vascular congestion vascular congestion is developing since previous study.  No  definite edema. No blunting of costophrenic angles.  No pneumothorax.  No focal consolidation.  Minimal scattered gas and stool in the colon.  No free intra- abdominal air.  No abnormal air fluid levels.  No radiopaque stones.  Mild degenerative changes in the spine.  IMPRESSION: Cardiac enlargement with developing pulmonary vascular congestion. Nonobstructive bowel gas pattern.   Original Report Authenticated By: Burman Nieves, M.D.      Diagnosis: Abdominal pain    MDM  Patient presents to ER with complaints of abdominal pain. He has been seen twice in the last week and had workups including CAT scans each time. Now workup has shown any abnormalities. Patient reports that after he went home from the ER yesterday he had a large amount of diarrhea. Since then he has been having a lot of cramping pain. White count has elevated over his baseline, but as the only abnormality seen on his workup. He does have a history of congestive heart failure. I do not see any worsening by exam, x-ray and atraumatic peptide. No sign of acute ischemia or infarct. I did discuss this with the patient. He would like to be admitted, but I do not see any abnormalities that would require this at this time. Patient was told he needs to see his primary care physician, Dr. [name] the office Monday morning. We'll treat for pain and cramping.        Gilda Crease, MD 01/20/12 [phone]

## 2012-01-25 ENCOUNTER — Inpatient Hospital Stay (HOSPITAL_COMMUNITY)
Admission: EM | Admit: 2012-01-25 | Discharge: 2012-02-05 | DRG: 287 | Disposition: A | Discharge status: Home Health | Payer: PRIVATE HEALTH INSURANCE | Attending: Cardiology | Admitting: Cardiology

## 2012-01-25 ENCOUNTER — Encounter (HOSPITAL_COMMUNITY): Payer: Self-pay | Admitting: Emergency Medicine

## 2012-01-25 ENCOUNTER — Emergency Department (HOSPITAL_COMMUNITY): Payer: PRIVATE HEALTH INSURANCE

## 2012-01-25 ENCOUNTER — Ambulatory Visit (INDEPENDENT_AMBULATORY_CARE_PROVIDER_SITE_OTHER): Payer: PRIVATE HEALTH INSURANCE | Admitting: *Deleted

## 2012-01-25 DIAGNOSIS — I2510 Atherosclerotic heart disease of native coronary artery without angina pectoris: Secondary | ICD-10-CM | POA: Diagnosis present

## 2012-01-25 DIAGNOSIS — E669 Obesity, unspecified: Secondary | ICD-10-CM | POA: Diagnosis present

## 2012-01-25 DIAGNOSIS — I428 Other cardiomyopathies: Secondary | ICD-10-CM | POA: Diagnosis present

## 2012-01-25 DIAGNOSIS — R079 Chest pain, unspecified: Secondary | ICD-10-CM

## 2012-01-25 DIAGNOSIS — E7800 Pure hypercholesterolemia, unspecified: Secondary | ICD-10-CM | POA: Diagnosis present

## 2012-01-25 DIAGNOSIS — D869 Sarcoidosis, unspecified: Secondary | ICD-10-CM | POA: Diagnosis present

## 2012-01-25 DIAGNOSIS — N184 Chronic kidney disease, stage 4 (severe): Secondary | ICD-10-CM | POA: Diagnosis present

## 2012-01-25 DIAGNOSIS — K219 Gastro-esophageal reflux disease without esophagitis: Secondary | ICD-10-CM | POA: Diagnosis present

## 2012-01-25 DIAGNOSIS — Z7901 Long term (current) use of anticoagulants: Secondary | ICD-10-CM

## 2012-01-25 DIAGNOSIS — I509 Heart failure, unspecified: Secondary | ICD-10-CM | POA: Diagnosis present

## 2012-01-25 DIAGNOSIS — N179 Acute kidney failure, unspecified: Secondary | ICD-10-CM

## 2012-01-25 DIAGNOSIS — I129 Hypertensive chronic kidney disease with stage 1 through stage 4 chronic kidney disease, or unspecified chronic kidney disease: Secondary | ICD-10-CM | POA: Diagnosis present

## 2012-01-25 DIAGNOSIS — I498 Other specified cardiac arrhythmias: Secondary | ICD-10-CM | POA: Diagnosis present

## 2012-01-25 DIAGNOSIS — N189 Chronic kidney disease, unspecified: Secondary | ICD-10-CM

## 2012-01-25 DIAGNOSIS — E876 Hypokalemia: Secondary | ICD-10-CM | POA: Diagnosis not present

## 2012-01-25 DIAGNOSIS — I5023 Acute on chronic systolic (congestive) heart failure: Principal | ICD-10-CM | POA: Diagnosis present

## 2012-01-25 DIAGNOSIS — I252 Old myocardial infarction: Secondary | ICD-10-CM

## 2012-01-25 DIAGNOSIS — I4891 Unspecified atrial fibrillation: Secondary | ICD-10-CM | POA: Diagnosis present

## 2012-01-25 DIAGNOSIS — Z95810 Presence of automatic (implantable) cardiac defibrillator: Secondary | ICD-10-CM

## 2012-01-25 LAB — URINALYSIS, ROUTINE W REFLEX MICROSCOPIC
Ketones, ur: NEGATIVE mg/dL
Protein, ur: >300 mg/dL — AB
Urobilinogen, UA: 1 mg/dL (ref 0.0–1.0)

## 2012-01-25 LAB — COMPREHENSIVE METABOLIC PANEL
AST: 108 U/L — ABNORMAL HIGH (ref 0–37)
Albumin: 3.2 g/dL — ABNORMAL LOW (ref 3.5–5.2)
Albumin: 3.4 g/dL — ABNORMAL LOW (ref 3.5–5.2)
BUN: 39 mg/dL — ABNORMAL HIGH (ref 6–23)
Calcium: 10 mg/dL (ref 8.4–10.5)
Chloride: 103 mEq/L (ref 96–112)
Creatinine, Ser: 2.13 mg/dL — ABNORMAL HIGH (ref 0.50–1.35)
Creatinine, Ser: 2.07 mg/dL — ABNORMAL HIGH (ref 0.50–1.35)
Total Bilirubin: 1 mg/dL (ref 0.3–1.2)
Total Protein: 8.1 g/dL (ref 6.0–8.3)

## 2012-01-25 LAB — CBC WITH DIFFERENTIAL/PLATELET
Basophils Absolute: 0 10*3/uL (ref 0.0–0.1)
Basophils Relative: 0 % (ref 0–1)
Eosinophils Relative: 4 % (ref 0–5)
HCT: 36.9 % — ABNORMAL LOW (ref 39.0–52.0)
MCHC: 33.1 g/dL (ref 30.0–36.0)
Monocytes Absolute: 0.9 10*3/uL (ref 0.1–1.0)
Neutro Abs: 2.2 10*3/uL (ref 1.7–7.7)
Platelets: 279 10*3/uL (ref 150–400)
RDW: 14.4 % (ref 11.5–15.5)

## 2012-01-25 LAB — CBC
HCT: 40.1 % (ref 39.0–52.0)
HCT: 39.1 % (ref 39.0–52.0)
Hemoglobin: 13.2 g/dL (ref 13.0–17.0)
MCH: 29.7 pg (ref 26.0–34.0)
MCHC: 32.9 g/dL (ref 30.0–36.0)
MCHC: 33 g/dL (ref 30.0–36.0)
MCV: 90.9 fL (ref 78.0–100.0)
RBC: 4.45 MIL/uL (ref 4.22–5.81)
RDW: 14.5 % (ref 11.5–15.5)

## 2012-01-25 LAB — BASIC METABOLIC PANEL
BUN: 39 mg/dL — ABNORMAL HIGH (ref 6–23)
CO2: 23 mEq/L (ref 19–32)
Calcium: 9.8 mg/dL (ref 8.4–10.5)
GFR calc non Af Amer: 32 mL/min — ABNORMAL LOW (ref >90)
Glucose, Bld: 146 mg/dL — ABNORMAL HIGH (ref 70–99)
Potassium: 4 mEq/L (ref 3.5–5.1)
Sodium: 140 mEq/L (ref 135–145)

## 2012-01-25 LAB — PROTIME-INR
INR: 1.38 (ref 0.00–1.49)
INR: 1.51 — ABNORMAL HIGH (ref 0.00–1.49)
INR: 1.54 — ABNORMAL HIGH (ref 0.00–1.49)
Prothrombin Time: 16.6 seconds — ABNORMAL HIGH (ref 11.6–15.2)
Prothrombin Time: 17.8 seconds — ABNORMAL HIGH (ref 11.6–15.2)
Prothrombin Time: 18 seconds — ABNORMAL HIGH (ref 11.6–15.2)

## 2012-01-25 LAB — PRO B NATRIURETIC PEPTIDE: Pro B Natriuretic peptide (BNP): 5263 pg/mL — ABNORMAL HIGH (ref 0–125)

## 2012-01-25 LAB — URINE MICROSCOPIC-ADD ON

## 2012-01-25 LAB — POCT I-STAT TROPONIN I: Troponin i, poc: 0.05 ng/mL (ref 0.00–0.08)

## 2012-01-25 MED ORDER — ONDANSETRON HCL 4 MG/2ML IJ SOLN: 4.0000 mg | Freq: Four times a day (QID) | INTRAMUSCULAR | Status: DC | PRN

## 2012-01-25 MED ORDER — PANTOPRAZOLE SODIUM 40 MG PO TBEC
40.0000 mg | DELAYED_RELEASE_TABLET | Freq: Every day | ORAL | Status: DC
  Administered 2012-01-25 – 2012-02-05 (×12): 40 mg via ORAL
  Filled 2012-01-25 (×12): qty 1

## 2012-01-25 MED ORDER — ATORVASTATIN CALCIUM 20 MG PO TABS
20.0000 mg | ORAL_TABLET | Freq: Every day | ORAL | Status: DC
  Administered 2012-01-26 – 2012-02-04 (×10): 20 mg via ORAL
  Filled 2012-01-25 (×11): qty 1

## 2012-01-25 MED ORDER — HEPARIN (PORCINE) IN NACL 100-0.45 UNIT/ML-% IJ SOLN
1550.0000 [IU]/h | INTRAMUSCULAR | Status: DC
  Administered 2012-01-25: 1550 [IU]/h via INTRAVENOUS
  Filled 2012-01-25 (×2): qty 250

## 2012-01-25 MED ORDER — POTASSIUM CHLORIDE CRYS ER 20 MEQ PO TBCR
20.0000 meq | EXTENDED_RELEASE_TABLET | Freq: Two times a day (BID) | ORAL | Status: DC
  Administered 2012-01-25 – 2012-01-26 (×3): 20 meq via ORAL
  Filled 2012-01-25 (×5): qty 1

## 2012-01-25 MED ORDER — ALLOPURINOL 100 MG PO TABS
100.0000 mg | ORAL_TABLET | Freq: Every day | ORAL | Status: DC
  Administered 2012-01-25 – 2012-02-05 (×12): 100 mg via ORAL
  Filled 2012-01-25 (×12): qty 1

## 2012-01-25 MED ORDER — SODIUM CHLORIDE 0.9 % IJ SOLN: 3.0000 mL | Freq: Two times a day (BID) | INTRAMUSCULAR | Status: DC

## 2012-01-25 MED ORDER — ACETAMINOPHEN 325 MG PO TABS: 650.0000 mg | ORAL_TABLET | ORAL | Status: DC | PRN

## 2012-01-25 MED ORDER — AMIODARONE HCL 200 MG PO TABS
200.0000 mg | ORAL_TABLET | Freq: Every day | ORAL | Status: DC
  Administered 2012-01-26 – 2012-02-04 (×10): 200 mg via ORAL
  Filled 2012-01-25 (×11): qty 1

## 2012-01-25 MED ORDER — SODIUM CHLORIDE 0.9 % IV SOLN
INTRAVENOUS | Status: AC
  Administered 2012-01-25: 21:00:00 via INTRAVENOUS

## 2012-01-25 MED ORDER — SODIUM CHLORIDE 0.9 % IJ SOLN: 3.0000 mL | INTRAMUSCULAR | Status: DC | PRN

## 2012-01-25 MED ORDER — SODIUM CHLORIDE 0.9 % IV SOLN: 250.0000 mL | INTRAVENOUS | Status: DC | PRN

## 2012-01-25 MED ORDER — ISOSORB DINITRATE-HYDRALAZINE 20-37.5 MG PO TABS
1.0000 | ORAL_TABLET | Freq: Two times a day (BID) | ORAL | Status: DC
  Administered 2012-01-25 – 2012-01-26 (×3): 1 via ORAL
  Filled 2012-01-25 (×5): qty 1

## 2012-01-25 MED ORDER — POLYETHYLENE GLYCOL 3350 17 GM/SCOOP PO POWD
17.0000 g | Freq: Two times a day (BID) | ORAL | Status: DC
Start: 2012-01-25 — End: 2012-01-25
  Filled 2012-01-25: qty 255

## 2012-01-25 MED ORDER — FUROSEMIDE 10 MG/ML IJ SOLN
80.0000 mg | Freq: Two times a day (BID) | INTRAMUSCULAR | Status: DC
  Administered 2012-01-25 – 2012-01-26 (×3): 80 mg via INTRAVENOUS
  Filled 2012-01-25 (×6): qty 8

## 2012-01-25 MED ORDER — WARFARIN - PHARMACIST DOSING INPATIENT
Freq: Every day | Status: DC
  Administered 2012-01-31 – 2012-02-01 (×2)

## 2012-01-25 MED ORDER — SORBITOL 70 % SOLN
960.0000 mL | TOPICAL_OIL | Freq: Once | ORAL | Status: AC
  Administered 2012-01-25: 960 mL via RECTAL
  Filled 2012-01-25: qty 240

## 2012-01-25 MED ORDER — CARVEDILOL 3.125 MG PO TABS
3.1250 mg | ORAL_TABLET | Freq: Two times a day (BID) | ORAL | Status: DC
  Administered 2012-01-26 – 2012-01-27 (×2): 3.125 mg via ORAL
  Filled 2012-01-25 (×4): qty 1

## 2012-01-25 MED ORDER — WARFARIN SODIUM 7.5 MG PO TABS
15.0000 mg | ORAL_TABLET | Freq: Once | ORAL | Status: AC
  Administered 2012-01-25: 15 mg via ORAL
  Filled 2012-01-25 (×2): qty 2

## 2012-01-25 MED ORDER — POLYETHYLENE GLYCOL 3350 17 G PO PACK
17.0000 g | PACK | Freq: Two times a day (BID) | ORAL | Status: DC
  Administered 2012-01-25 – 2012-02-05 (×15): 17 g via ORAL
  Filled 2012-01-25 (×23): qty 1

## 2012-01-25 MED ORDER — FUROSEMIDE 10 MG/ML IJ SOLN
80.0000 mg | Freq: Once | INTRAMUSCULAR | Status: AC
  Administered 2012-01-25: 80 mg via INTRAVENOUS
  Filled 2012-01-25: qty 8

## 2012-01-25 NOTE — ED Notes (Signed)
Dr. Davidson at bedside.

## 2012-01-25 NOTE — ED Notes (Signed)
Pt c/o CP, abd pain, and sob. Pt states he was vomiting blood yesterday. Pt came from [place] they were concerned and had him sent to the ED.

## 2012-01-25 NOTE — H&P (Signed)
[name] is an 62 y.o. male.   Chief Complaint: Progressive increasing shortness of breath associated with cough and abdominal swelling  HPI: Patient is 62 year old male with past medical history significant for multiple medical problems i.e. 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 fib, history of nonsustained VT, GERD, hypercholesteremia, history of sarcoidosis, chronic kidney disease stage IV, hypertension, history of cocaine abuse, gouty arthritis, came to the ER by EMS from the [facility] cardiology as patient was noted to be hypoxic on room air complaining of progressive shortness of breath and was noted to be in heart failure. Patient went to see [name] for ICD followup. Patient denies any palpitation lightheadedness or syncope. Denies any PND orthopnea but complains of mild leg swelling. Complains of cough with occasional bloody sputum but denies any fever chills sore throat. Denies any excessive salty food intake or by mouth fluids states in fact has been taking higher dose of Lasix for last 2 days..  Past Medical History  Diagnosis Date  . CHF (congestive heart failure)   . Sarcoidosis   . Cardiomyopathy, dilated, nonischemic     non ischemic by cath  . Acute on chronic systolic heart failure   . Automatic implantable cardiac defibrillator in situ   . Atrial fibrillation   . NSVT (nonsustained ventricular tachycardia)   . GERD (gastroesophageal reflux disease)   . Hypercholesteremia   . Myocardial infarction   . Shortness of breath   . Chronic kidney disease (CKD), stage III (moderate)   . Pacemaker   . Anginal pain   . Gout   . Hypertension     [name]  . Coronary artery disease     Past Surgical History  Procedure Date  . Back surgery 1987    Ruptured disk repair  .  Pacemaker insertion     with ICD  . Tee without cardioversion 01/17/2011    Procedure: TRANSESOPHAGEAL ECHOCARDIOGRAM (TEE);  Surgeon: Ricki Rodriguez, MD;  Location: [facility] ENDOSCOPY;  Service: Cardiovascular;  Laterality: N/A;  . Cardioversion 01/17/2011    Procedure: CARDIOVERSION;  Surgeon: Ricki Rodriguez, MD;  Location: [facility] ENDOSCOPY;  Service: Cardiovascular;  Laterality: N/A;  . Cardiac catheterization   . Insert / replace / remove pacemaker   . Anterior cervical decomp/discectomy fusion 08/21/2011    Procedure: ANTERIOR CERVICAL DECOMPRESSION/DISCECTOMY FUSION 2 LEVELS;  Surgeon: Eldred Manges, MD;  Location: [facility] OR;  Service: Orthopedics;  Laterality: N/A;  C5-6, C6-7 Anterior Cervical Discectomy and Fusion, allograft, plate    Family History  Problem Relation Age of Onset  . Heart disease    . Heart failure    . Stroke    . Anesthesia problems Neg Hx   . Hypotension Neg Hx   . Malignant hyperthermia Neg Hx   . Pseudochol deficiency Neg Hx    Social History:  reports that he has never smoked. He has never used smokeless tobacco. He reports that he uses illicit drugs ("Crack" cocaine and Marijuana). He reports that he does not drink alcohol.  Allergies: No Known Allergies   (Not in a hospital admission)  Results for orders placed during the hospital encounter of 01/25/12 (from the past 48 hour(s))  CBC     Status: Normal   Collection Time   01/25/12  1:01 PM      Component Value Range  Comment   WBC 7.2  4.0 - 10.5 K/uL    RBC 4.45  4.22 - 5.81 MIL/uL    Hemoglobin 13.2  13.0 - 17.0 g/dL    HCT 16.1  09.6 - 04.5 %    MCV 90.1  78.0 - 100.0 fL    MCH 29.7  26.0 - 34.0 pg    MCHC 32.9  30.0 - 36.0 g/dL    RDW 40.9  81.1 - 91.4 %    Platelets 268  150 - 400 K/uL   BASIC METABOLIC PANEL     Status: Abnormal   Collection Time   01/25/12  1:01 PM      Component Value Range Comment   Sodium 140  135 - 145 mEq/L    Potassium 4.0  3.5 - 5.1 mEq/L    Chloride 102  96 - 112 mEq/L     CO2 23  19 - 32 mEq/L    Glucose, Bld 146 (*) 70 - 99 mg/dL    BUN 39 (*) 6 - 23 mg/dL    Creatinine, Ser 7.82 (*) 0.50 - 1.35 mg/dL    Calcium 9.8  8.4 - 95.6 mg/dL    GFR calc non Af Amer 32 (*) >90 mL/min    GFR calc Af Amer 37 (*) >90 mL/min   PRO B NATRIURETIC PEPTIDE     Status: Abnormal   Collection Time   01/25/12  1:01 PM      Component Value Range Comment   Pro B Natriuretic peptide (BNP) 5115.0 (*) 0 - 125 pg/mL   PROTIME-INR     Status: Abnormal   Collection Time   01/25/12  1:01 PM      Component Value Range Comment   Prothrombin Time 16.6 (*) 11.6 - 15.2 seconds    INR 1.38  0.00 - 1.49   POCT I-STAT TROPONIN I     Status: Normal   Collection Time   01/25/12  1:06 PM      Component Value Range Comment   Troponin i, poc 0.05  0.00 - 0.08 ng/mL    Comment 3            PRO B NATRIURETIC PEPTIDE     Status: Abnormal   Collection Time   01/25/12  2:01 PM      Component Value Range Comment   Pro B Natriuretic peptide (BNP) 5338.0 (*) 0 - 125 pg/mL   TROPONIN I     Status: Normal   Collection Time   01/25/12  2:01 PM      Component Value Range Comment   Troponin I <0.30  <0.30 ng/mL   CBC     Status: Abnormal   Collection Time   01/25/12  2:01 PM      Component Value Range Comment   WBC 8.0  4.0 - 10.5 K/uL    RBC 4.30  4.22 - 5.81 MIL/uL    Hemoglobin 12.9 (*) 13.0 - 17.0 g/dL    HCT 21.3  08.6 - 57.8 %    MCV 90.9  78.0 - 100.0 fL    MCH 30.0  26.0 - 34.0 pg    MCHC 33.0  30.0 - 36.0 g/dL    RDW 46.9  62.9 - 52.8 %    Platelets 285  150 - 400 K/uL   COMPREHENSIVE METABOLIC PANEL     Status: Abnormal   Collection Time   01/25/12  2:01 PM      Component Value Range Comment  Sodium 141  135 - 145 mEq/L    Potassium 3.8  3.5 - 5.1 mEq/L    Chloride 103  96 - 112 mEq/L    CO2 19  19 - 32 mEq/L    Glucose, Bld 87  70 - 99 mg/dL    BUN 39 (*) 6 - 23 mg/dL    Creatinine, Ser 1.61 (*) 0.50 - 1.35 mg/dL    Calcium 9.8  8.4 - 09.6 mg/dL    Total Protein 8.1  6.0 -  8.3 g/dL    Albumin 3.2 (*) 3.5 - 5.2 g/dL    AST 045 (*) 0 - 37 U/L    ALT 63 (*) 0 - 53 U/L    Alkaline Phosphatase 79  39 - 117 U/L    Total Bilirubin 1.0  0.3 - 1.2 mg/dL    GFR calc non Af Amer 32 (*) >90 mL/min    GFR calc Af Amer 37 (*) >90 mL/min   PROTIME-INR     Status: Abnormal   Collection Time   01/25/12  2:01 PM      Component Value Range Comment   Prothrombin Time 17.8 (*) 11.6 - 15.2 seconds    INR 1.51 (*) 0.00 - 1.49   APTT     Status: Normal   Collection Time   01/25/12  2:01 PM      Component Value Range Comment   aPTT 37  24 - 37 seconds   URINALYSIS, ROUTINE W REFLEX MICROSCOPIC     Status: Abnormal   Collection Time   01/25/12  3:04 PM      Component Value Range Comment   Color, Urine AMBER (*) YELLOW BIOCHEMICALS MAY BE AFFECTED BY COLOR   APPearance CLEAR  CLEAR    Specific Gravity, Urine 1.029  1.005 - 1.030    pH 5.0  5.0 - 8.0    Glucose, UA NEGATIVE  NEGATIVE mg/dL    Hgb urine dipstick SMALL (*) NEGATIVE    Bilirubin Urine SMALL (*) NEGATIVE    Ketones, ur NEGATIVE  NEGATIVE mg/dL    Protein, ur >409 (*) NEGATIVE mg/dL    Urobilinogen, UA 1.0  0.0 - 1.0 mg/dL    Nitrite NEGATIVE  NEGATIVE    Leukocytes, UA NEGATIVE  NEGATIVE   URINE MICROSCOPIC-ADD ON     Status: Abnormal   Collection Time   01/25/12  3:04 PM      Component Value Range Comment   Squamous Epithelial / LPF RARE  RARE    WBC, UA 0-2  <3 WBC/hpf    RBC / HPF 0-2  <3 RBC/hpf    Bacteria, UA RARE  RARE    Casts GRANULAR CAST (*) NEGATIVE HYALINE CASTS   Dg Chest Portable 1 View  01/25/2012  *RADIOLOGY REPORT*  Clinical Data: Shortness of breath, chest pain, cough  PORTABLE CHEST - 1 VIEW  Comparison: 01/19/2002  Findings: .  Cardiomegaly.  Single lead cardiac pacemaker is unchanged in position.  No pulmonary edema.  There is hazy right basilar atelectasis or infiltrate.  IMPRESSION: Cardiomegaly.  Hazy right basilar atelectasis or infiltrate.  No convincing pulmonary edema.   Original  Report Authenticated By: Natasha Mead, M.D.     Review of Systems  Constitutional: Negative for fever and chills.  Eyes: Negative for blurred vision and double vision.  Respiratory: Positive for cough, hemoptysis and shortness of breath. Negative for sputum production.   Cardiovascular: Positive for leg swelling. Negative for chest pain, palpitations, orthopnea and  claudication.  Gastrointestinal: Positive for constipation. Negative for nausea and vomiting.  Genitourinary: Negative for dysuria.  Neurological: Negative for headaches.    Blood pressure 110/73, pulse 76, resp. rate 24, SpO2 90.00%. Physical Exam  HENT:  Head: Atraumatic.  Eyes: Conjunctivae normal are normal. Pupils are equal, round, and reactive to light. Left eye exhibits no discharge. No scleral icterus.  Neck: Normal range of motion. Neck supple. JVD present. No thyromegaly present.  Cardiovascular:       There is soft systolic murmur and S3 gallop  Respiratory:       Decreased breath sound at bases with bibasilar Rales noted  GI: Soft. He exhibits distension. There is no tenderness. There is no rebound.  Musculoskeletal:       No clubbing cyanosis 1+ edema noted     Assessment/Plan Decompensated systolic heart failure Severe nonischemic dilated cardiomyopathy status post ICD Hypertension Hypercholesteremia Chronic kidney disease stage IV Chronic atrial fibrillation History of nonsustained VT in History of sarcoidosis History of Cocaine abuse Plan As per orders  [facility] N 01/25/2012, 4:39 PM

## 2012-01-25 NOTE — ED Notes (Signed)
Per EMS pt was sent over from [facility] for edema in extremities and sob. Pt was 50% RA, was put on non rebreather and came up to 88%. Pt has Hx of CHF, and A fib.

## 2012-01-25 NOTE — Consult Note (Signed)
Advanced Heart Failure Team Consult Note  Referring Physician: Dr. [name] Primary Physician:  Primary Cardiologist:  Dr. [name]  Reason for Consultation: Repeat hospitalizations for heart failure  HPI:    [name] is a 62 y.o. gentlemen with multiple hospital admissions for HF exacerbations.  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  He also has history of ventricular tachycardia s/p St Jude ICD by [name], chronic kidney disease: stage III, baseline Cr ~2, and paroxysmal atrial arrhythmias controlled by amiodarone.   As well as sarcoidosis, hypertension. Cath in 2008 by Dr. [name] showed no CAD.  Echo 12/2011: LVEF 20-25% with diffuse hypokinesis.  Mild AI.  Mod MR.  Mildly dilated LA.  Mildly reduced RV.  Mildly dilated RA.  Mod TR   He lives at home with his mother.  He says his father died of a weak heart at 75.  His sister died at 19 from MI.  Nephew died at 69 of SCD.  Denies alcohol and tobacco abuse.  History of cocaine abuse, + UDS in 11/2011.  He says he is compliant with his meds.  He uses his scale almost every morning.  Has 3 boys that live out of state.  He is on disability.    He presented with dyspnea/cough from Dr. Lubertha Basque office today.  Weight this morning 232 pounds which is up from his baseline at 227 pounds.  His discharge weight in Nov '13 was 219 pounds.  Currently he can barely walk up his driveway.  He noted his legs "tire out".  Denies orthopnea/PND.  Denies recent ICD shocks, dizziness or syncope.  Has increased lasix 120 mg BID because he felt overloaded but this did not help edema.    Review of Systems: [y] = yes, [ ]  = no   General: Weight gain [ y]; Weight loss [ ] ; Anorexia [ ] ; Fatigue Cove.Etienne ]; Fever [ ] ; Chills [ ] ; Weakness Cove.Etienne ]  Cardiac: Chest pain/pressure [ ] ; Resting SOB [ ] ; Exertional SOB Cove.Etienne ]; Orthopnea [ ] ; Pedal Edema [ ] ; Palpitations  [ ] ; Syncope [ ] ; Presyncope [ ] ; Paroxysmal nocturnal dyspnea[ ]   Pulmonary: Cough [ ] ; Wheezing[ ] ; Hemoptysis[ ] ; Sputum [ ] ; Snoring [ ]   GI: Vomiting[ ] ; Dysphagia[ ] ; Melena[ ] ; Hematochezia [ ] ; Heartburn[ ] ; Abdominal pain [ ] ; Constipation [ ] ; Diarrhea [ ] ; BRBPR [ ]   GU: Hematuria[ ] ; Dysuria [ ] ; Nocturia[ ]   Vascular: Pain in legs with walking [ ] ; Pain in feet with lying flat [ ] ; Non-healing sores [ ] ; Stroke [ ] ; TIA [ ] ; Slurred speech [ ] ;  Neuro: Headaches[ ] ; Vertigo[ ] ; Seizures[ ] ; Paresthesias[ ] ;Blurred vision [ ] ; Diplopia [ ] ; Vision changes [ ]   Ortho/Skin: Arthritis [ ] ; Joint pain [ ] ; Muscle pain [ ] ; Joint swelling [ ] ; Back Pain [ ] ; Rash [ ]   Psych: Depression[ ] ; Anxiety[ ]   Heme: Bleeding problems [ ] ; Clotting disorders [ ] ; Anemia [ ]   Endocrine: Diabetes Cove.Etienne ]; Thyroid dysfunction[ ]   Home Medications Prior to Admission medications   Medication Sig Start Date End Date Taking? Authorizing Provider  allopurinol (ZYLOPRIM) 300 MG tablet Take 300 mg by mouth daily.   Yes Historical Provider, MD  amiodarone (PACERONE) 200 MG tablet Take 1 tablet (200 mg total) by mouth daily. 11/13/11  Yes Verne Spurr, PA-C  carvedilol (COREG) 3.125 MG tablet Take 1 tablet (3.125 mg  total) by mouth 2 (two) times daily. 12/15/11  Yes Robynn Pane, MD  dicyclomine (BENTYL) 20 MG tablet Take 1 tablet (20 mg total) by mouth 2 (two) times daily. 01/20/12  Yes Gilda Crease, MD  furosemide (LASIX) 80 MG tablet Take 120 mg by mouth 2 (two) times daily.    Yes Historical Provider, MD  isosorbide-hydrALAZINE (BIDIL) 20-37.5 MG per tablet Take 1 tablet by mouth 2 (two) times daily.    Yes Historical Provider, MD  LORazepam (ATIVAN) 1 MG tablet Take 0.5 tablets (0.5 mg total) by mouth 2 (two) times daily as needed for anxiety. 01/15/12  Yes Hannah Muthersbaugh, PA-C  ondansetron (ZOFRAN-ODT) 8 MG disintegrating tablet Take 8 mg by mouth every 8 (eight) hours as needed. 8mg  ODT q4  hours prn nausea 01/19/12  Yes Angus Seller, PA  oxyCODONE-acetaminophen (PERCOCET/ROXICET) 5-325 MG per tablet Take 1 tablet by mouth every 4 (four) hours as needed. For pain. 01/19/12  Yes Phill Mutter Dammen, PA  pantoprazole (PROTONIX) 40 MG tablet Take 40 mg by mouth daily.   Yes Historical Provider, MD  polyethylene glycol powder (GLYCOLAX/MIRALAX) powder Take 17 g by mouth 2 (two) times daily. Until daily soft stools  OTC 01/15/12  Yes Hannah Muthersbaugh, PA-C  predniSONE (DELTASONE) 10 MG tablet Take 2 tablets (20 mg total) by mouth daily. 01/19/12  Yes Angus Seller, PA  rosuvastatin (CRESTOR) 5 MG tablet Take 1 tablet (5 mg total) by mouth at bedtime. 11/13/11  Yes Verne Spurr, PA-C  warfarin (COUMADIN) 10 MG tablet Take 10 mg by mouth every evening.    Yes Historical Provider, MD    Past Medical History: Past Medical History  Diagnosis Date  . CHF (congestive heart failure)   . Sarcoidosis   . Cardiomyopathy, dilated, nonischemic     non ischemic by cath  . Acute on chronic systolic heart failure   . Automatic implantable cardiac defibrillator in situ   . Atrial fibrillation   . NSVT (nonsustained ventricular tachycardia)   . GERD (gastroesophageal reflux disease)   . Hypercholesteremia   . Myocardial infarction   . Shortness of breath   . Chronic kidney disease (CKD), stage III (moderate)   . Pacemaker   . Anginal pain   . Gout   . Hypertension     [name]  . Coronary artery disease     Past Surgical History: Past Surgical History  Procedure Date  . Back surgery 1987    Ruptured disk repair  . Pacemaker insertion     with ICD  . Tee without cardioversion 01/17/2011    Procedure: TRANSESOPHAGEAL ECHOCARDIOGRAM (TEE);  Surgeon: Ricki Rodriguez, MD;  Location: [facility] ENDOSCOPY;  Service: Cardiovascular;  Laterality: N/A;  . Cardioversion 01/17/2011    Procedure: CARDIOVERSION;  Surgeon: Ricki Rodriguez, MD;  Location: [facility] ENDOSCOPY;  Service: Cardiovascular;   Laterality: N/A;  . Cardiac catheterization   . Insert / replace / remove pacemaker   . Anterior cervical decomp/discectomy fusion 08/21/2011    Procedure: ANTERIOR CERVICAL DECOMPRESSION/DISCECTOMY FUSION 2 LEVELS;  Surgeon: Eldred Manges, MD;  Location: [facility] OR;  Service: Orthopedics;  Laterality: N/A;  C5-6, C6-7 Anterior Cervical Discectomy and Fusion, allograft, plate    Family History: Family History  Problem Relation Age of Onset  . Heart disease    . Heart failure    . Stroke    . Anesthesia problems Neg Hx   . Hypotension Neg Hx   . Malignant hyperthermia Neg Hx   .  Pseudochol deficiency Neg Hx     Social History: History   Social History  . Marital Status: Divorced    Spouse Name: N/A    Number of Children: N/A  . Years of Education: N/A   Social History Main Topics  . Smoking status: Never Smoker   . Smokeless tobacco: Never Used  . Alcohol Use: No  . Drug Use: Yes    Special: "Crack" cocaine, Marijuana  . Sexually Active: Not Currently   Other Topics Concern  . None   Social History Narrative  . None    Allergies:  No Known Allergies  Objective:    Vital Signs:   Temp:  [97.5 F (36.4 C)] 97.5 F (36.4 C) 02-20-22 1650) Pulse Rate:  [25-106] 90  02/20/2022 1650) Resp:  [19-37] 20  02/20/2022 1650) BP: (92-133)/(50-109) 128/78 mmHg 02-20-22 1650) SpO2:  [88 %-100 %] 97 % 02/20/22 1650)      Physical Exam: General:  Well appearing. No resp difficulty HEENT: normal Neck: supple. JVP elevated. Carotids 2+ bilat; no bruits. No lymphadenopathy or thryomegaly appreciated. Cor: PMI nondisplaced. Regular rate & rhythm. 3/81m MR.  +S3 Lungs: clear Abdomen: soft, nontender, obese, + distention. Palpable liver edge. No bruits or masses. Good bowel sounds. Extremities: no cyanosis, clubbing, rash, trace lower extremity edema Neuro: alert & orientedx3, cranial nerves grossly intact. moves all 4 extremities w/o difficulty. Affect pleasant   Labs: Basic Metabolic  Panel:  Lab 21-Feb-2012 1401 02/21/12 1301 01/20/12 1621 01/18/12 2140  NA 141 140 137 140  K 3.8 4.0 4.5 3.9  CL 103 102 103 107  CO2 19 23 19 21   GLUCOSE 87 146* 129* 97  BUN 39* 39* 49* 33*  CREATININE 2.13* 2.09* 2.72* 2.16*  CALCIUM 9.8 9.8 10.1 --  MG -- -- -- --  PHOS -- -- -- --    Liver Function Tests:  Lab 02/21/12 1401 01/20/12 1621 01/18/12 2140  AST 100* 58* 82*  ALT 63* 45 55*  ALKPHOS 79 77 80  BILITOT 1.0 0.6 1.1  PROT 8.1 8.0 8.4*  ALBUMIN 3.2* 3.2* 3.2*    Lab 01/20/12 1621  LIPASE 51  AMYLASE --   No results found for this basename: AMMONIA:3 in the last 168 hours  CBC:  Lab 2012/02/21 1401 02-21-12 1301 01/20/12 1621 01/18/12 2140  WBC 8.0 7.2 14.6* 5.8  NEUTROABS -- -- 12.3* 2.6  HGB 12.9* 13.2 13.3 13.8  HCT 39.1 40.1 39.9 41.0  MCV 90.9 90.1 91.5 92.1  PLT 285 268 256 230    Cardiac Enzymes:  Lab 2012-02-21 1401 01/20/12 1622  CKTOTAL -- --  CKMB -- --  CKMBINDEX -- --  TROPONINI <0.30 <0.30    BNP: BNP (last 3 results)  Basename 02-21-2012 1401 02-21-2012 1301 01/20/12 1622  PROBNP 5338.0* 5115.0* 7679.0*    CBG: No results found for this basename: GLUCAP:5 in the last 168 hours  Coagulation Studies:  Basename Feb 21, 2012 1401 2012/02/21 1301  LABPROT 17.8* 16.6*  INR 1.51* 1.38    Other results: EKG:AF 100. IVCD. LAFB No ST-T wave abnormalities.    Imaging: Dg Chest Portable 1 View  02-21-2012  *RADIOLOGY REPORT*  Clinical Data: Shortness of breath, chest pain, cough  PORTABLE CHEST - 1 VIEW  Comparison: 01/19/2002  Findings: .  Cardiomegaly.  Single lead cardiac pacemaker is unchanged in position.  No pulmonary edema.  There is hazy right basilar atelectasis or infiltrate.  IMPRESSION: Cardiomegaly.  Hazy right basilar atelectasis or infiltrate.  No  convincing pulmonary edema.   Original Report Authenticated By: Natasha Mead, M.D.       Medications:    Scheduled Meds:   . sodium chloride   Intravenous STAT  . allopurinol  100  mg Oral Daily  . amiodarone  200 mg Oral Daily  . atorvastatin  20 mg Oral q1800  . carvedilol  3.125 mg Oral BID WC  . furosemide  80 mg Intravenous BID  . isosorbide-hydrALAZINE  1 tablet Oral BID  . pantoprazole  40 mg Oral Daily  . polyethylene glycol powder  17 g Oral BID  . potassium chloride  20 mEq Oral BID  . sodium chloride  3 mL Intravenous Q12H  . sorbitol, milk of mag, mineral oil, glycerin (SMOG) enema  960 mL Rectal Once  . warfarin  15 mg Oral ONCE-1800  . Warfarin - Pharmacist Dosing Inpatient   Does not apply q1800   Continuous Infusions:   . heparin     PRN Meds:.sodium chloride, acetaminophen, ondansetron (ZOFRAN) IV, sodium chloride   Assessment:   1. A/C systolic heart failure EF 20%     --with multiple readmissions 2. NICM  3. Chronic renal failure 4. H/o VT s/p ICD 5. H/o substance abuse      --cocaine positive 11/13 6. Chronic AF  Plan/Discussion:    Mr. Cappuccio presents with recurrent class IIIB/IV HF in the setting of severe NICM. He has multiple admissions over the last 6 months despite aggressive HF care with intolerance to multiple meds due to low output. On exam he appears low output. Ideally will need advanced therapies but recent + drug screen and renal insufficiency precludes much of this. We discussed the options at length and he says he is motivated to do what it takes to improve his health. We will plan RHC in am and will also repeat urine drug screen.   I discussed this with Dr. [name] who agrees. The HF team will continue to follow.   Darrell Boom Hanah Moultry,MD 7:24 PM       Length of Stay: 0  Robbi Garter 01/25/2012, 5:06 PM

## 2012-01-25 NOTE — ED Provider Notes (Signed)
History     CSN: [id number]  Arrival date & time 01/25/12  1205   None     Chief Complaint  Patient presents with  . Shortness of Breath  . Chest Pain    (Consider location/radiation/quality/duration/timing/severity/associated sxs/prior treatment) Patient is a 62 y.o. male presenting with shortness of breath. The history is provided by the patient.  Shortness of Breath  The current episode started 5 to 7 days ago (Pt says that he has been sick for 5 days.  He can't breath.  He  was at [facility] and was sent in from there.). The problem occurs continuously. The problem has been gradually worsening. The problem is severe. Nothing relieves the symptoms. Nothing aggravates the symptoms. Associated symptoms include cough and shortness of breath. Pertinent negatives include no fever. He has had prior hospitalizations. He has had no prior ICU admissions. He has had no prior intubations. Recently, medical care has been given by a specialist and at another facility (He has had several ED visits in the past few weeks for shortness of breath and for abdominal pain. ).    Past Medical History  Diagnosis Date  . CHF (congestive heart failure)   . Sarcoidosis   . Cardiomyopathy, dilated, nonischemic     non ischemic by cath  . Acute on chronic systolic heart failure   . Automatic implantable cardiac defibrillator in situ   . Atrial fibrillation   . NSVT (nonsustained ventricular tachycardia)   . GERD (gastroesophageal reflux disease)   . Hypercholesteremia   . Myocardial infarction   . Shortness of breath   . Chronic kidney disease (CKD), stage III (moderate)   . Pacemaker   . Anginal pain   . Gout   . Hypertension     [name]  . Coronary artery disease     Past Surgical History  Procedure Date  . Back surgery 1987    Ruptured disk repair  . Pacemaker insertion     with ICD  . Tee without cardioversion 01/17/2011    Procedure: TRANSESOPHAGEAL ECHOCARDIOGRAM (TEE);   Surgeon: Ricki Rodriguez, MD;  Location: [facility] ENDOSCOPY;  Service: Cardiovascular;  Laterality: N/A;  . Cardioversion 01/17/2011    Procedure: CARDIOVERSION;  Surgeon: Ricki Rodriguez, MD;  Location: [facility] ENDOSCOPY;  Service: Cardiovascular;  Laterality: N/A;  . Cardiac catheterization   . Insert / replace / remove pacemaker   . Anterior cervical decomp/discectomy fusion 08/21/2011    Procedure: ANTERIOR CERVICAL DECOMPRESSION/DISCECTOMY FUSION 2 LEVELS;  Surgeon: Eldred Manges, MD;  Location: [facility] OR;  Service: Orthopedics;  Laterality: N/A;  C5-6, C6-7 Anterior Cervical Discectomy and Fusion, allograft, plate    Family History  Problem Relation Age of Onset  . Heart disease    . Heart failure    . Stroke    . Anesthesia problems Neg Hx   . Hypotension Neg Hx   . Malignant hyperthermia Neg Hx   . Pseudochol deficiency Neg Hx     History  Substance Use Topics  . Smoking status: Never Smoker   . Smokeless tobacco: Never Used  . Alcohol Use: No      Review of Systems  Constitutional: Negative.  Negative for fever and chills.  HENT: Negative.   Eyes: Negative.   Respiratory: Positive for cough and shortness of breath.   Cardiovascular: Negative.   Gastrointestinal: Negative.   Genitourinary: Negative.   Musculoskeletal: Negative.   Neurological: Negative.   Psychiatric/Behavioral: Positive for agitation. The patient is nervous/anxious.  Allergies  Review of patient's allergies indicates no known allergies.  Home Medications   Current Outpatient Rx  Name  Route  Sig  Dispense  Refill  . ALLOPURINOL 300 MG PO TABS   Oral   Take 300 mg by mouth daily.         . AMIODARONE HCL 200 MG PO TABS   Oral   Take 1 tablet (200 mg total) by mouth daily.         Marland Kitchen CARVEDILOL 3.125 MG PO TABS   Oral   Take 1 tablet (3.125 mg total) by mouth 2 (two) times daily.   60 tablet   3   . DICYCLOMINE HCL 20 MG PO TABS   Oral   Take 1 tablet (20 mg total) by mouth 2 (two) times  daily.   20 tablet   0   . FUROSEMIDE 80 MG PO TABS   Oral   Take 120 mg by mouth 2 (two) times daily.          . ISOSORB DINITRATE-HYDRALAZINE 20-37.5 MG PO TABS   Oral   Take 1 tablet by mouth 2 (two) times daily.          Marland Kitchen LORAZEPAM 1 MG PO TABS   Oral   Take 0.5 tablets (0.5 mg total) by mouth 2 (two) times daily as needed for anxiety.   15 tablet   0   . ONDANSETRON 8 MG PO TBDP   Oral   Take 8 mg by mouth every 8 (eight) hours as needed. 8mg  ODT q4 hours prn nausea         . OXYCODONE-ACETAMINOPHEN 5-325 MG PO TABS   Oral   Take 1 tablet by mouth every 4 (four) hours as needed. For pain.         Marland Kitchen PANTOPRAZOLE SODIUM 40 MG PO TBEC   Oral   Take 40 mg by mouth daily.         Marland Kitchen POLYETHYLENE GLYCOL 3350 PO POWD   Oral   Take 17 g by mouth 2 (two) times daily. Until daily soft stools  OTC   255 g   0   . PREDNISONE 10 MG PO TABS   Oral   Take 2 tablets (20 mg total) by mouth daily.   10 tablet   0   . ROSUVASTATIN CALCIUM 5 MG PO TABS   Oral   Take 1 tablet (5 mg total) by mouth at bedtime.         . WARFARIN SODIUM 10 MG PO TABS   Oral   Take 10 mg by mouth every evening.            BP 121/103  Pulse 106  Resp 26  SpO2 100%  Physical Exam  Nursing note and vitals reviewed. Constitutional: He is oriented to person, place, and time. He appears well-developed and well-nourished. Distressed: in moderate distress complaining of shortness of breath.  HENT:  Head: Normocephalic and atraumatic.  Right Ear: External ear normal.  Left Ear: External ear normal.  Mouth/Throat: Oropharynx is clear and moist.  Eyes: Conjunctivae normal and EOM are normal. Pupils are equal, round, and reactive to light.  Neck: Normal range of motion. Neck supple.  Cardiovascular: Normal rate, regular rhythm and normal heart sounds.   Pulmonary/Chest:       Fine rales at both bases.  Abdominal: Soft.  Musculoskeletal: Normal range of motion. He exhibits no  edema and no tenderness.  Neurological: He is  alert and oriented to person, place, and time.       No sensory or motor deficit.  Skin: Skin is warm and dry.  Psychiatric:       Anxious, agitated.    ED Course  Procedures (including critical care time)  Labs Reviewed  PROTIME-INR - Abnormal; Notable for the following:    Prothrombin Time 16.6 (*)     All other components within normal limits  CBC  POCT I-STAT TROPONIN I  BASIC METABOLIC PANEL  PRO B NATRIURETIC PEPTIDE    Date: 01/25/2012  Rate: 100  Rhythm: atrial fibrillation and premature ventricular contractions (PVC)  QRS Axis: left  Intervals: QT prolonged  ST/T Wave abnormalities: normal  Conduction Disutrbances:left anterior fascicular block  Narrative Interpretation: Abnormal EKG  Old EKG Reviewed: unchanged  3:46 PM Results for orders placed during the hospital encounter of 01/25/12  CBC      Component Value Range   WBC 7.2  4.0 - 10.5 K/uL   RBC 4.45  4.22 - 5.81 MIL/uL   Hemoglobin 13.2  13.0 - 17.0 g/dL   HCT 16.1  09.6 - 04.5 %   MCV 90.1  78.0 - 100.0 fL   MCH 29.7  26.0 - 34.0 pg   MCHC 32.9  30.0 - 36.0 g/dL   RDW 40.9  81.1 - 91.4 %   Platelets 268  150 - 400 K/uL  BASIC METABOLIC PANEL      Component Value Range   Sodium 140  135 - 145 mEq/L   Potassium 4.0  3.5 - 5.1 mEq/L   Chloride 102  96 - 112 mEq/L   CO2 23  19 - 32 mEq/L   Glucose, Bld 146 (*) 70 - 99 mg/dL   BUN 39 (*) 6 - 23 mg/dL   Creatinine, Ser 7.82 (*) 0.50 - 1.35 mg/dL   Calcium 9.8  8.4 - 95.6 mg/dL   GFR calc non Af Amer 32 (*) >90 mL/min   GFR calc Af Amer 37 (*) >90 mL/min  PRO B NATRIURETIC PEPTIDE      Component Value Range   Pro B Natriuretic peptide (BNP) 5115.0 (*) 0 - 125 pg/mL  PROTIME-INR      Component Value Range   Prothrombin Time 16.6 (*) 11.6 - 15.2 seconds   INR 1.38  0.00 - 1.49  POCT I-STAT TROPONIN I      Component Value Range   Troponin i, poc 0.05  0.00 - 0.08 ng/mL   Comment 3           PRO B  NATRIURETIC PEPTIDE      Component Value Range   Pro B Natriuretic peptide (BNP) 5338.0 (*) 0 - 125 pg/mL  TROPONIN I      Component Value Range   Troponin I <0.30  <0.30 ng/mL  CBC      Component Value Range   WBC 8.0  4.0 - 10.5 K/uL   RBC 4.30  4.22 - 5.81 MIL/uL   Hemoglobin 12.9 (*) 13.0 - 17.0 g/dL   HCT 21.3  08.6 - 57.8 %   MCV 90.9  78.0 - 100.0 fL   MCH 30.0  26.0 - 34.0 pg   MCHC 33.0  30.0 - 36.0 g/dL   RDW 46.9  62.9 - 52.8 %   Platelets 285  150 - 400 K/uL  PROTIME-INR      Component Value Range   Prothrombin Time 17.8 (*) 11.6 - 15.2 seconds   INR 1.51 (*)  0.00 - 1.49  APTT      Component Value Range   aPTT 37  24 - 37 seconds  URINALYSIS, ROUTINE W REFLEX MICROSCOPIC      Component Value Range   Color, Urine AMBER (*) YELLOW   APPearance CLEAR  CLEAR   Specific Gravity, Urine 1.029  1.005 - 1.030   pH 5.0  5.0 - 8.0   Glucose, UA NEGATIVE  NEGATIVE mg/dL   Hgb urine dipstick SMALL (*) NEGATIVE   Bilirubin Urine SMALL (*) NEGATIVE   Ketones, ur NEGATIVE  NEGATIVE mg/dL   Protein, ur >956 (*) NEGATIVE mg/dL   Urobilinogen, UA 1.0  0.0 - 1.0 mg/dL   Nitrite NEGATIVE  NEGATIVE   Leukocytes, UA NEGATIVE  NEGATIVE  URINE MICROSCOPIC-ADD ON      Component Value Range   Squamous Epithelial / LPF RARE  RARE   WBC, UA 0-2  <3 WBC/hpf   RBC / HPF 0-2  <3 RBC/hpf   Bacteria, UA RARE  RARE   Casts GRANULAR CAST (*) NEGATIVE  Dg Chest Portable 1 View  01/25/2012  *RADIOLOGY REPORT*  Clinical Data: Shortness of breath, chest pain, cough  PORTABLE CHEST - 1 VIEW  Comparison: 01/19/2002  Findings: .  Cardiomegaly.  Single lead cardiac pacemaker is unchanged in position.  No pulmonary edema.  There is hazy right basilar atelectasis or infiltrate.  IMPRESSION: Cardiomegaly.  Hazy right basilar atelectasis or infiltrate.  No convincing pulmonary edema.   Original Report Authenticated By: Natasha Mead, M.D.     Lab tests show elevated BNP, chest x-ray showing cardiomegaly,  EKG non-acute. Rx IV Lasix.  Discussed with Dr. [name] --> admit to a telemetry bed.    1. Congestive heart failure           Carleene Cooper III, MD 01/25/12 [phone]

## 2012-01-25 NOTE — ED Notes (Signed)
Cardiology at bedside.

## 2012-01-25 NOTE — Progress Notes (Signed)
ANTICOAGULATION CONSULT NOTE - Initial Consult  Pharmacy Consult for Heparin and Coumadin Indication: atrial fibrillation  No Known Allergies  Patient Measurements: Height: 6\' 3"  (190.5 cm) Weight: 232 lb (105.235 kg) IBW/kg (Calculated) : 84.5  Heparin Dosing Weight: 105.2 kg  Vital Signs: Temp: 97.5 F (36.4 C) (01/23 1650) Temp src: Oral (01/23 1650) BP: 130/96 mmHg (01/23 1755) Pulse Rate: 95  (01/23 1755)  Labs:  Basename 01/25/12 1401 01/25/12 1301  HGB 12.9* 13.2  HCT 39.1 40.1  PLT 285 268  APTT 37 --  LABPROT 17.8* 16.6*  INR 1.51* 1.38  HEPARINUNFRC -- --  CREATININE 2.13* 2.09*  CKTOTAL -- --  CKMB -- --  TROPONINI <0.30 --    Estimated Creatinine Clearance: 47.2 ml/min (by C-G formula based on Cr of 2.13).   Medical History: Past Medical History  Diagnosis Date  . CHF (congestive heart failure)   . Sarcoidosis   . Cardiomyopathy, dilated, nonischemic     non ischemic by cath  . Acute on chronic systolic heart failure   . Automatic implantable cardiac defibrillator in situ   . Atrial fibrillation   . NSVT (nonsustained ventricular tachycardia)   . GERD (gastroesophageal reflux disease)   . Hypercholesteremia   . Myocardial infarction   . Shortness of breath   . Chronic kidney disease (CKD), stage III (moderate)   . Pacemaker   . Anginal pain   . Gout   . Hypertension     [name]  . Coronary artery disease     Medications:  Prescriptions prior to admission  Medication Sig Dispense Refill  . allopurinol (ZYLOPRIM) 300 MG tablet Take 300 mg by mouth daily.      Marland Kitchen amiodarone (PACERONE) 200 MG tablet Take 1 tablet (200 mg total) by mouth daily.      . carvedilol (COREG) 3.125 MG tablet Take 1 tablet (3.125 mg total) by mouth 2 (two) times daily.  60 tablet  3  . dicyclomine (BENTYL) 20 MG tablet Take 1 tablet (20 mg total) by mouth 2 (two) times daily.  20 tablet  0  . furosemide (LASIX) 80 MG tablet Take 120 mg by mouth 2 (two) times  daily.       . isosorbide-hydrALAZINE (BIDIL) 20-37.5 MG per tablet Take 1 tablet by mouth 2 (two) times daily.       Marland Kitchen LORazepam (ATIVAN) 1 MG tablet Take 0.5 tablets (0.5 mg total) by mouth 2 (two) times daily as needed for anxiety.  15 tablet  0  . ondansetron (ZOFRAN-ODT) 8 MG disintegrating tablet Take 8 mg by mouth every 8 (eight) hours as needed. 8mg  ODT q4 hours prn nausea      . oxyCODONE-acetaminophen (PERCOCET/ROXICET) 5-325 MG per tablet Take 1 tablet by mouth every 4 (four) hours as needed. For pain.      . pantoprazole (PROTONIX) 40 MG tablet Take 40 mg by mouth daily.      . polyethylene glycol powder (GLYCOLAX/MIRALAX) powder Take 17 g by mouth 2 (two) times daily. Until daily soft stools  OTC  255 g  0  . predniSONE (DELTASONE) 10 MG tablet Take 2 tablets (20 mg total) by mouth daily.  10 tablet  0  . rosuvastatin (CRESTOR) 5 MG tablet Take 1 tablet (5 mg total) by mouth at bedtime.      Marland Kitchen warfarin (COUMADIN) 10 MG tablet Take 10 mg by mouth every evening.         Assessment: 62 y/o male sent from  [place] to the ED for edema, chest pain, and SOB. Pharmacy consulted to begin heparin and Coumadin for Afib. He complains of cough with occasional bloody sputum although CBC is normal and INR is subtherapeutic. Will watch closely.  Confirmed with patient home Coumadin dosing is 10 mg daily. He has not taken dose today. He denies missing doses and any increased green intake.  Goal of Therapy:  INR 2-3 Heparin level 0.3-0.7 units/ml Monitor platelets by anticoagulation protocol: Yes   Plan:  -Begin heparin drip at 1550 units/hr with no bolus per physician request -Coumadin 15 mg po tonight -Heparin level 6 hours after started -Daily heparin level, INR, and CBC -Monitor for s/sx of bleeding  [facility], [place].D., BCPS Clinical Pharmacist Pager: [phone] 01/25/2012 6:06 PM

## 2012-01-25 NOTE — Progress Notes (Signed)
Walk in, thought he had pacer check today, c/o cp, sob, taken to room by w/c, c/o cough x 2 days with scant blood yesterday in phlegm,  spo2 80% on room air,  2 L O2 [place] spo2 99% bp 114/86 p 86 t 96.8 oral, pt states his abd is hard/ appears distended. Labs reviewed1/18/14   bnp 7679.0 BUN 49 creat 2.72, today EKG a fib  Rate 97, Per DOD Dr Patty Sermons call EMS, ems arrived, ED called to update, Rosann Auerbach was called, pt left by stretcher. Total time 35 minutes.

## 2012-01-26 ENCOUNTER — Encounter (HOSPITAL_COMMUNITY)
Admission: EM | Disposition: A | Discharge status: Home Health | Payer: Self-pay | Source: Home / Self Care | Attending: Cardiology

## 2012-01-26 DIAGNOSIS — I509 Heart failure, unspecified: Secondary | ICD-10-CM

## 2012-01-26 HISTORY — PX: RIGHT HEART CATHETERIZATION: SHX5447

## 2012-01-26 LAB — RAPID URINE DRUG SCREEN, HOSP PERFORMED
Amphetamines: NOT DETECTED
Barbiturates: NOT DETECTED
Benzodiazepines: NOT DETECTED
Cocaine: NOT DETECTED

## 2012-01-26 LAB — CBC
HCT: 39.8 % (ref 39.0–52.0)
Hemoglobin: 13.2 g/dL (ref 13.0–17.0)
RBC: 4.44 MIL/uL (ref 4.22–5.81)
RDW: 14.4 % (ref 11.5–15.5)
WBC: 6.1 10*3/uL (ref 4.0–10.5)

## 2012-01-26 LAB — PROTIME-INR
INR: 1.61 — ABNORMAL HIGH (ref 0.00–1.49)
Prothrombin Time: 18.6 seconds — ABNORMAL HIGH (ref 11.6–15.2)

## 2012-01-26 LAB — POCT I-STAT 3, VENOUS BLOOD GAS (G3P V)
Bicarbonate: 20.7 mEq/L (ref 20.0–24.0)
Bicarbonate: 22.6 mEq/L (ref 20.0–24.0)
O2 Saturation: 57 %
pCO2, Ven: 35.8 mmHg — ABNORMAL LOW (ref 45.0–50.0)
pCO2, Ven: 37.6 mmHg — ABNORMAL LOW (ref 45.0–50.0)
pH, Ven: 7.388 — ABNORMAL HIGH (ref 7.250–7.300)
pO2, Ven: 31 mmHg (ref 30.0–45.0)

## 2012-01-26 LAB — BASIC METABOLIC PANEL
CO2: 22 mEq/L (ref 19–32)
Chloride: 104 mEq/L (ref 96–112)
Glucose, Bld: 85 mg/dL (ref 70–99)
Potassium: 4.2 mEq/L (ref 3.5–5.1)
Sodium: 142 mEq/L (ref 135–145)

## 2012-01-26 LAB — TSH: TSH: 6.487 u[IU]/mL — ABNORMAL HIGH (ref 0.350–4.500)

## 2012-01-26 LAB — HEPARIN LEVEL (UNFRACTIONATED): Heparin Unfractionated: 0.38 IU/mL (ref 0.30–0.70)

## 2012-01-26 SURGERY — RIGHT HEART CATH
Anesthesia: LOCAL

## 2012-01-26 MED ORDER — HEPARIN (PORCINE) IN NACL 100-0.45 UNIT/ML-% IJ SOLN
1550.0000 [IU]/h | INTRAMUSCULAR | Status: DC
  Administered 2012-01-26 – 2012-01-27 (×2): 1550 [IU]/h via INTRAVENOUS
  Filled 2012-01-26 (×2): qty 250

## 2012-01-26 MED ORDER — MILRINONE IN DEXTROSE 20 MG/100ML IV SOLN
0.1250 ug/kg/min | INTRAVENOUS | Status: DC
  Administered 2012-01-26 – 2012-01-27 (×4): 0.25 ug/kg/min via INTRAVENOUS
  Administered 2012-01-28 – 2012-01-31 (×9): 0.375 ug/kg/min via INTRAVENOUS
  Administered 2012-01-31 – 2012-02-01 (×2): 0.25 ug/kg/min via INTRAVENOUS
  Administered 2012-02-01: 0.125 ug/kg/min via INTRAVENOUS
  Filled 2012-01-26 (×14): qty 100

## 2012-01-26 MED ORDER — SODIUM CHLORIDE 0.9 % IV SOLN: 250.0000 mL | INTRAVENOUS | Status: DC | PRN

## 2012-01-26 MED ORDER — SODIUM CHLORIDE 0.9 % IV SOLN
INTRAVENOUS | Status: DC
  Administered 2012-01-26: 20 mL/h via INTRAVENOUS
  Administered 2012-01-30 – 2012-02-01 (×2): via INTRAVENOUS
  Administered 2012-02-03: 10 mL via INTRAVENOUS

## 2012-01-26 MED ORDER — ACETAMINOPHEN 325 MG PO TABS
650.0000 mg | ORAL_TABLET | ORAL | Status: DC | PRN
  Administered 2012-01-26 (×2): 650 mg via ORAL
  Filled 2012-01-26 (×3): qty 2

## 2012-01-26 MED ORDER — LIDOCAINE HCL (PF) 1 % IJ SOLN
INTRAMUSCULAR | Status: AC
  Filled 2012-01-26: qty 30

## 2012-01-26 MED ORDER — HEPARIN (PORCINE) IN NACL 2-0.9 UNIT/ML-% IJ SOLN
INTRAMUSCULAR | Status: AC
  Filled 2012-01-26: qty 500

## 2012-01-26 MED ORDER — ALPRAZOLAM 0.25 MG PO TABS
0.2500 mg | ORAL_TABLET | Freq: Two times a day (BID) | ORAL | Status: DC | PRN
  Administered 2012-01-26 – 2012-02-02 (×8): 0.25 mg via ORAL
  Filled 2012-01-26 (×8): qty 1

## 2012-01-26 MED ORDER — ONDANSETRON HCL 4 MG/2ML IJ SOLN: 4.0000 mg | Freq: Four times a day (QID) | INTRAMUSCULAR | Status: DC | PRN

## 2012-01-26 MED ORDER — SODIUM CHLORIDE 0.9 % IJ SOLN
3.0000 mL | INTRAMUSCULAR | Status: DC | PRN
  Administered 2012-01-28: 3 mL via INTRAVENOUS

## 2012-01-26 MED ORDER — OXYCODONE-ACETAMINOPHEN 5-325 MG PO TABS
1.0000 | ORAL_TABLET | Freq: Four times a day (QID) | ORAL | Status: DC | PRN
  Administered 2012-01-26 – 2012-02-03 (×18): 1 via ORAL
  Filled 2012-01-26 (×18): qty 1

## 2012-01-26 MED ORDER — MIDAZOLAM HCL 2 MG/2ML IJ SOLN
INTRAMUSCULAR | Status: AC
  Filled 2012-01-26: qty 2

## 2012-01-26 MED ORDER — SODIUM CHLORIDE 0.9 % IJ SOLN
3.0000 mL | Freq: Two times a day (BID) | INTRAMUSCULAR | Status: DC
  Administered 2012-01-26 – 2012-01-31 (×10): 3 mL via INTRAVENOUS

## 2012-01-26 MED ORDER — FENTANYL CITRATE 0.05 MG/ML IJ SOLN
INTRAMUSCULAR | Status: AC
Start: 2012-01-26 — End: 2012-01-26
  Filled 2012-01-26: qty 2

## 2012-01-26 MED ORDER — WARFARIN SODIUM 7.5 MG PO TABS
15.0000 mg | ORAL_TABLET | Freq: Once | ORAL | Status: AC
  Administered 2012-01-26: 15 mg via ORAL
  Filled 2012-01-26: qty 2

## 2012-01-26 NOTE — Interval H&P Note (Signed)
History and Physical Interval Note:  01/26/2012 8:31 AM  [name]  has presented today for surgery, with the diagnosis of chf  The various methods of treatment have been discussed with the patient and family. After consideration of risks, benefits and other options for treatment, the patient has consented to  Procedure(s) (LRB) with comments: RIGHT HEART CATH (N/A) as a surgical intervention .  The patient's history has been reviewed, patient examined, no change in status, stable for surgery.  I have reviewed the patient's chart and labs.  Questions were answered to the patient's satisfaction.     Jeorge Reister

## 2012-01-26 NOTE — Progress Notes (Signed)
Subjective: Appreciate Dr. Gala Romney help. Swan readings noted Denies any chest pain states breathing has improved. Tolerating milrinone okay.  Objective:  Vital Signs in the last 24 hours: Temp:  [97.3 F (36.3 C)-97.9 F (36.6 C)] 97.5 F (36.4 C) (01/24 1127) Pulse Rate:  [25-105] 29  (01/24 1200) Resp:  [19-37] 25  (01/24 1200) BP: (92-134)/(50-109) 120/100 mmHg (01/24 1100) SpO2:  [90 %-100 %] 100 % (01/24 1200) Weight:  [103 kg (227 lb 1.2 oz)-105.235 kg (232 lb)] 103 kg (227 lb 1.2 oz) (01/24 0217)  Intake/Output from previous day: 01/23 0701 - 01/24 0700 In: 0  Out: 2175 [Urine:2175] Intake/Output from this shift: Total I/O In: 398.9 [P.O.:360; I.V.:38.9] Out: -   Physical Exam: Neck: JVD - 8 cm above sternal notch, no adenopathy, no carotid bruit and supple, symmetrical, trachea midline Lungs: Decreased breath sound at bases with bibasilar Rales air entry improved Heart: irregularly irregular rhythm, S1, S2 normal and Soft systolic murmur and S3 gallop noted Abdomen: soft, non-tender; bowel sounds normal; no masses,  no organomegaly Extremities: extremities normal, atraumatic, no cyanosis or edema  Lab Results:  Basename 01/26/12 0200 01/25/12 2005  WBC 6.1 6.7  HGB 13.2 12.2*  PLT 265 279    Basename 01/26/12 0200 01/25/12 2005  NA 142 141  K 4.2 3.7  CL 104 103  CO2 22 23  GLUCOSE 85 79  BUN 39* 39*  CREATININE 2.04* 2.07*    Basename 01/26/12 0145 01/25/12 2006  TROPONINI <0.30 <0.30   Hepatic Function Panel  Basename 01/25/12 2005  PROT 8.0  ALBUMIN 3.4*  AST 108*  ALT 68*  ALKPHOS 79  BILITOT 1.3*  BILIDIR --  IBILI --   No results found for this basename: CHOL in the last 72 hours No results found for this basename: PROTIME in the last 72 hours  Imaging: Imaging results have been reviewed and Dg Chest Portable 1 View  01/25/2012  *RADIOLOGY REPORT*  Clinical Data: Shortness of breath, chest pain, cough  PORTABLE CHEST - 1 VIEW   Comparison: 01/19/2002  Findings: .  Cardiomegaly.  Single lead cardiac pacemaker is unchanged in position.  No pulmonary edema.  There is hazy right basilar atelectasis or infiltrate.  IMPRESSION: Cardiomegaly.  Hazy right basilar atelectasis or infiltrate.  No convincing pulmonary edema.   Original Report Authenticated By: Natasha Mead, M.D.     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 Dr. Algie Coffer on-call for weekend  LOS: 1 day    Robynn Pane 01/26/2012, 12:36 PM

## 2012-01-26 NOTE — CV Procedure (Signed)
Cardiac Cath Procedure Note:  Indication:  Heart failure  Procedures performed:  1) Right heart catheterization  Description of procedure:   The risks and indication of the procedure were explained. Consent was signed and placed on the chart. An appropriate timeout was taken prior to the procedure. The right neck was prepped and draped in the routine sterile fashion and anesthetized with 1% local lidocaine.   A 7 FR venous sheath was placed in the right internal jugular vein using a modified Seldinger technique. A standard Swan-Ganz catheter was used for the procedure.   Complications: None apparent.  Findings:  RA = 14 RV = 51/16/15 PA = 57/34 (43)  PCW = 26 Fick cardiac output/index = 4.2/1.8 Thermo CO/CI = 3.9/1.7 PVR = 4.0 Woods O2 sat = 97% PA sat = 57%, 58% Ao Pressure (non-invasive): 116/80 (93) SVR = 1504  Assessment: 1. Decompensated HF with low output  Plan/Discussion:  Will leave swan in. Start milrinone in attempt to optimize cardiac and renal function. Continue IV lasix 80 mg bid.  Keep swan in over weekend. Will attempt to wean early next week and make decision whether or not he will need home inotropes. Titrate hydralazine/nitrates as tolerated.   Alyrica Thurow 9:06 AM

## 2012-01-26 NOTE — Progress Notes (Signed)
ANTICOAGULATION CONSULT NOTE - Follow Up Consult  Pharmacy Consult for Heparin and Coumadin Indication: atrial fibrillation  Allergies  Allergen Reactions  . Ativan (Lorazepam) Other (See Comments)    Didn't like the way it made him feel; hallucinations, talking to himself    Patient Measurements: Height: 6\' 3"  (190.5 cm) Weight: 227 lb 1.2 oz (103 kg) (scale c) IBW/kg (Calculated) : 84.5  Heparin Dosing Weight: 105.2 kg  Vital Signs: Temp: 97.9 F (36.6 C) (01/24 0217) Temp src: Oral (01/24 0217) BP: 131/79 mmHg (01/24 0217) Pulse Rate: 97  (01/24 0217)  Labs:  Basename 01/26/12 0200 01/25/12 2006 01/25/12 2005 01/25/12 1401 01/25/12 1301  HGB 13.2 -- 12.2* -- --  HCT 39.8 -- 36.9* 39.1 --  PLT 265 -- 279 285 --  APTT -- -- 39* 37 --  LABPROT 18.6* -- 18.0* 17.8* --  INR 1.61* -- 1.54* 1.51* --  HEPARINUNFRC 0.38 -- -- -- --  CREATININE -- -- 2.07* 2.13* 2.09*  CKTOTAL -- -- -- -- --  CKMB -- -- -- -- --  TROPONINI -- <0.30 -- <0.30 --    Estimated Creatinine Clearance: 48.1 ml/min (by C-G formula based on Cr of 2.07).   Medical History: Past Medical History  Diagnosis Date  . CHF (congestive heart failure)   . Sarcoidosis   . Cardiomyopathy, dilated, nonischemic     non ischemic by cath  . Acute on chronic systolic heart failure   . Automatic implantable cardiac defibrillator in situ   . Atrial fibrillation   . NSVT (nonsustained ventricular tachycardia)   . GERD (gastroesophageal reflux disease)   . Hypercholesteremia   . Myocardial infarction   . Shortness of breath   . Chronic kidney disease (CKD), stage III (moderate)   . Pacemaker   . Anginal pain   . Gout   . Hypertension     [name]  . Coronary artery disease     Medications:  Prescriptions prior to admission  Medication Sig Dispense Refill  . allopurinol (ZYLOPRIM) 300 MG tablet Take 300 mg by mouth daily.      Marland Kitchen amiodarone (PACERONE) 200 MG tablet Take 1 tablet (200 mg total) by  mouth daily.      . carvedilol (COREG) 3.125 MG tablet Take 1 tablet (3.125 mg total) by mouth 2 (two) times daily.  60 tablet  3  . dicyclomine (BENTYL) 20 MG tablet Take 1 tablet (20 mg total) by mouth 2 (two) times daily.  20 tablet  0  . furosemide (LASIX) 80 MG tablet Take 120 mg by mouth 2 (two) times daily.       . isosorbide-hydrALAZINE (BIDIL) 20-37.5 MG per tablet Take 1 tablet by mouth 2 (two) times daily.       Marland Kitchen LORazepam (ATIVAN) 1 MG tablet Take 0.5 tablets (0.5 mg total) by mouth 2 (two) times daily as needed for anxiety.  15 tablet  0  . ondansetron (ZOFRAN-ODT) 8 MG disintegrating tablet Take 8 mg by mouth every 8 (eight) hours as needed. 8mg  ODT q4 hours prn nausea      . oxyCODONE-acetaminophen (PERCOCET/ROXICET) 5-325 MG per tablet Take 1 tablet by mouth every 4 (four) hours as needed. For pain.      . pantoprazole (PROTONIX) 40 MG tablet Take 40 mg by mouth daily.      . polyethylene glycol powder (GLYCOLAX/MIRALAX) powder Take 17 g by mouth 2 (two) times daily. Until daily soft stools  OTC  255 g  0  .  predniSONE (DELTASONE) 10 MG tablet Take 2 tablets (20 mg total) by mouth daily.  10 tablet  0  . rosuvastatin (CRESTOR) 5 MG tablet Take 1 tablet (5 mg total) by mouth at bedtime.      Marland Kitchen warfarin (COUMADIN) 10 MG tablet Take 10 mg by mouth every evening.         Assessment: 62 y/o male sent from [place] to the ED for edema, chest pain, and SOB. Pharmacy consulted to manage heparin and Coumadin for Afib. INR (1.61) remains below-goal. Heparin level (0.38) is at-goal on 1550 units/hr.   Goal of Therapy:  INR 2-3 Heparin level 0.3-0.7 units/ml Monitor platelets by anticoagulation protocol: Yes   Plan:  1. Continue IV heparin at 1550 units/hr.  2. Heparin level in 6 hours to confirm dosing.  3. Coumadin 15mg  po at 18:00 today.   Lorre Munroe, PharmD 01/26/2012 3:20 AM

## 2012-01-26 NOTE — Progress Notes (Signed)
ANTICOAGULATION CONSULT NOTE - Follow Up Consult  Pharmacy Consult for Heparin and Coumadin Indication: atrial fibrillation  Allergies  Allergen Reactions  . Ativan (Lorazepam) Other (See Comments)    Didn't like the way it made him feel; hallucinations, talking to himself    Patient Measurements: Height: 6\' 3"  (190.5 cm) Weight: 227 lb 1.2 oz (103 kg) (scale c) IBW/kg (Calculated) : 84.5  Heparin Dosing Weight: 105.2 kg  Vital Signs: Temp: 97.5 F (36.4 C) (01/24 1127) Temp src: Oral (01/24 1127) BP: 120/100 mmHg (01/24 1100) Pulse Rate: 29  (01/24 1200)  Labs:  Basename 01/26/12 0200 01/26/12 0145 01/25/12 2006 01/25/12 2005 01/25/12 1401  HGB 13.2 -- -- 12.2* --  HCT 39.8 -- -- 36.9* 39.1  PLT 265 -- -- 279 285  APTT -- -- -- 39* 37  LABPROT 18.6* -- -- 18.0* 17.8*  INR 1.61* -- -- 1.54* 1.51*  HEPARINUNFRC 0.38 -- -- -- --  CREATININE 2.04* -- -- 2.07* 2.13*  CKTOTAL -- -- -- -- --  CKMB -- -- -- -- --  TROPONINI -- <0.30 <0.30 -- <0.30    Estimated Creatinine Clearance: 48.8 ml/min (by C-G formula based on Cr of 2.04).   Medical History: Assessment: 62 y/o male sent from [place] to the ED for edema, chest pain, and SOB. Pharmacy consulted to manage heparin and Coumadin for Afib. INR (1.61) remains below-goal. Heparin level (0.38) is at-goal on 1550 units/hr.   Patient now s/p RHC with SWAN in place, started on milrinone therapy. Orders to resume heparin 6 hours post cath and continue coumadin dosing.  Goal of Therapy:  INR 2-3 Heparin level 0.3-0.7 units/ml Monitor platelets by anticoagulation protocol: Yes   Plan:  1. Restart IV heparin at 1550 units/hr.  2. Recheck Heparin level/INR in am.  3. Coumadin 15mg  po at 18:00 today.   Sheppard Coil, PharmD 01/26/2012 2:23 PM

## 2012-01-26 NOTE — Care Management Note (Addendum)
    Page 1 of 2   02/01/2012     3:27:44 PM   CARE MANAGEMENT NOTE 02/01/2012  Patient:  [name], [name]   Account Number:  [id number]  Date Initiated:  01/26/2012  Documentation initiated by:  Junius Creamer  Subjective/Objective Assessment:   adm w chf     Action/Plan:   lives w mother, pcp [name], hx of adv homecare   Anticipated DC Date:     Anticipated DC Plan:  HOME W HOME HEALTH SERVICES      DC Planning Services  CM consult      [facility] Choice  HOME HEALTH   Choice offered to / List presented to:  C-1 Patient   DME arranged  IV PUMP/EQUIPMENT      DME agency  [facility]     [facility] arranged  HH-1 RN      [facility] agency  [facility]   Status of service:   Medicare Important Message given?   (If response is "NO", the following Medicare IM given date fields will be blank) Date Medicare IM given:   Date Additional Medicare IM given:    Discharge Disposition:  HOME W HOME HEALTH SERVICES  Per UR Regulation:  Reviewed for med. necessity/level of care/duration of stay  If discussed at Long Length of Stay Meetings, dates discussed:   02/01/2012    Comments:  1/30 1525p debbie Jihad Brownlow rn,bsn spoke w pt again. he still wants to use [facility] for home rn and iv milrinone. have alerted donna w [facility] of need for home milrinone and poss dc on sat. milrinone form on chart for md.  1/24 1015 debbie Aleni Andrus rn,bsn saw note that may need home milrinone. pt has hx of adv homecare.placed inotropic form in shadow chart if needed.

## 2012-01-27 LAB — CBC
HCT: 34.8 % — ABNORMAL LOW (ref 39.0–52.0)
MCH: 29.6 pg (ref 26.0–34.0)
MCHC: 33.6 g/dL (ref 30.0–36.0)
MCV: 88.1 fL (ref 78.0–100.0)
RDW: 13.9 % (ref 11.5–15.5)
WBC: 6.5 10*3/uL (ref 4.0–10.5)

## 2012-01-27 LAB — PRO B NATRIURETIC PEPTIDE: Pro B Natriuretic peptide (BNP): 2775 pg/mL — ABNORMAL HIGH (ref 0–125)

## 2012-01-27 LAB — BASIC METABOLIC PANEL
BUN: 35 mg/dL — ABNORMAL HIGH (ref 6–23)
CO2: 27 mEq/L (ref 19–32)
Calcium: 9.2 mg/dL (ref 8.4–10.5)
Creatinine, Ser: 2.26 mg/dL — ABNORMAL HIGH (ref 0.50–1.35)
Glucose, Bld: 91 mg/dL (ref 70–99)

## 2012-01-27 LAB — CARBOXYHEMOGLOBIN
Methemoglobin: 1.3 % (ref 0.0–1.5)
Total hemoglobin: 11.8 g/dL — ABNORMAL LOW (ref 13.5–18.0)

## 2012-01-27 MED ORDER — HYDRALAZINE HCL 25 MG PO TABS: 12.5000 mg | ORAL_TABLET | Freq: Three times a day (TID) | ORAL | Status: DC

## 2012-01-27 MED ORDER — POTASSIUM CHLORIDE CRYS ER 20 MEQ PO TBCR
40.0000 meq | EXTENDED_RELEASE_TABLET | Freq: Once | ORAL | Status: AC
  Administered 2012-01-27: 40 meq via ORAL
  Filled 2012-01-27: qty 2

## 2012-01-27 MED ORDER — MAGNESIUM SULFATE 40 MG/ML IJ SOLN: 2.0000 g | Freq: Once | INTRAMUSCULAR | Status: DC

## 2012-01-27 MED ORDER — ISOSORBIDE MONONITRATE ER 30 MG PO TB24: 30.0000 mg | ORAL_TABLET | Freq: Every day | ORAL | Status: DC

## 2012-01-27 MED ORDER — ISOSORB DINITRATE-HYDRALAZINE 20-37.5 MG PO TABS
1.5000 | ORAL_TABLET | Freq: Two times a day (BID) | ORAL | Status: DC
  Administered 2012-01-27 (×2): 1.5 via ORAL
  Filled 2012-01-27 (×4): qty 1.5

## 2012-01-27 MED ORDER — ISOSORBIDE MONONITRATE ER 30 MG PO TB24
30.0000 mg | ORAL_TABLET | Freq: Every day | ORAL | Status: DC
  Filled 2012-01-27: qty 1

## 2012-01-27 MED ORDER — POTASSIUM CHLORIDE CRYS ER 20 MEQ PO TBCR
40.0000 meq | EXTENDED_RELEASE_TABLET | Freq: Two times a day (BID) | ORAL | Status: DC
  Administered 2012-01-27 – 2012-01-28 (×4): 40 meq via ORAL
  Filled 2012-01-27 (×5): qty 2

## 2012-01-27 MED ORDER — DIGOXIN 0.25 MG/ML IJ SOLN
0.2500 mg | Freq: Once | INTRAMUSCULAR | Status: AC
  Administered 2012-01-27: 0.25 mg via INTRAVENOUS
  Filled 2012-01-27 (×2): qty 1

## 2012-01-27 MED ORDER — HYDRALAZINE HCL 25 MG PO TABS
12.5000 mg | ORAL_TABLET | Freq: Three times a day (TID) | ORAL | Status: DC
  Filled 2012-01-27 (×4): qty 0.5

## 2012-01-27 MED ORDER — POTASSIUM CHLORIDE CRYS ER 20 MEQ PO TBCR: 20.0000 meq | EXTENDED_RELEASE_TABLET | Freq: Once | ORAL | Status: DC

## 2012-01-27 MED ORDER — FUROSEMIDE 80 MG PO TABS
80.0000 mg | ORAL_TABLET | Freq: Two times a day (BID) | ORAL | Status: DC
  Administered 2012-01-27 – 2012-01-28 (×3): 80 mg via ORAL
  Filled 2012-01-27 (×7): qty 1

## 2012-01-27 MED ORDER — MAGNESIUM SULFATE IN D5W 10-5 MG/ML-% IV SOLN
1.0000 g | Freq: Once | INTRAVENOUS | Status: AC
  Administered 2012-01-27: 1 g via INTRAVENOUS
  Filled 2012-01-27: qty 100

## 2012-01-27 MED ORDER — FUROSEMIDE 10 MG/ML IJ SOLN
80.0000 mg | Freq: Once | INTRAMUSCULAR | Status: AC
  Administered 2012-01-27: 80 mg via INTRAVENOUS

## 2012-01-27 MED ORDER — WARFARIN SODIUM 10 MG PO TABS
10.0000 mg | ORAL_TABLET | Freq: Once | ORAL | Status: AC
  Administered 2012-01-27: 10 mg via ORAL
  Filled 2012-01-27: qty 1

## 2012-01-27 MED ORDER — CARVEDILOL 6.25 MG PO TABS
6.2500 mg | ORAL_TABLET | Freq: Two times a day (BID) | ORAL | Status: DC
  Administered 2012-01-27 – 2012-02-05 (×18): 6.25 mg via ORAL
  Filled 2012-01-27 (×21): qty 1

## 2012-01-27 NOTE — Progress Notes (Signed)
Patient complained of chest burning worse when coughs and inspiration. EKG done, no changes. Notified Dr. Algie Coffer. Chest pain resolved on its own.

## 2012-01-27 NOTE — Progress Notes (Addendum)
Advanced Heart Failure Team Consult Note  SUBJECTIVE:  He feels fatigued.  No acute SOB.  No pain   PHYSICAL EXAM Filed Vitals:   01/27/12 0400 01/27/12 0500 01/27/12 0600 01/27/12 0700  BP: 116/79 106/82 113/90 121/69  Pulse: 74 99 81 105  Temp: 98.1 F (36.7 C) 98.2 F (36.8 C) 98.4 F (36.9 C) 98.4 F (36.9 C)  TempSrc:      Resp: 24 20 23 24   Height:      Weight:  226 lb 3.1 oz (102.6 kg)    SpO2: 99% 99% 100% 100%   General:  No distress HEENT  PERRL Neck  Swan intact without erythema or bleeding Lungs:  Few basilar crackles, overall decreased breath sounds Heart:  Irregular Abdomen:  Positive bowel sounds, no rebound no guarding Extremities:  No edema NEURO  Nonfocal  LABS: Lab Results  Component Value Date   CKTOTAL 50 10/10/2011   CKMB 2.6 10/10/2011   TROPONINI <0.30 01/26/2012   Results for orders placed during the hospital encounter of 01/25/12 (from the past 24 hour(s))  POCT I-STAT 3, BLOOD GAS (G3P V)     Status: Abnormal   Collection Time   01/26/12  8:52 AM      Component Value Range   pH, Ven 7.388 (*) 7.250 - 7.300   pCO2, Ven 37.6 (*) 45.0 - 50.0 mmHg   pO2, Ven 30.0  30.0 - 45.0 mmHg   Bicarbonate 22.6  20.0 - 24.0 mEq/L   TCO2 24  0 - 100 mmol/L   O2 Saturation 58.0     Acid-base deficit 2.0  0.0 - 2.0 mmol/L   Sample type VENOUS     Comment NOTIFIED PHYSICIAN    POCT I-STAT 3, BLOOD GAS (G3P V)     Status: Abnormal   Collection Time   01/26/12  8:52 AM      Component Value Range   pH, Ven 7.370 (*) 7.250 - 7.300   pCO2, Ven 35.8 (*) 45.0 - 50.0 mmHg   pO2, Ven 31.0  30.0 - 45.0 mmHg   Bicarbonate 20.7  20.0 - 24.0 mEq/L   TCO2 22  0 - 100 mmol/L   O2 Saturation 57.0     Acid-base deficit 4.0 (*) 0.0 - 2.0 mmol/L   Sample type VENOUS     Comment NOTIFIED PHYSICIAN    MRSA PCR SCREENING     Status: Normal   Collection Time   01/26/12  9:42 AM      Component Value Range   MRSA by PCR NEGATIVE  NEGATIVE  BASIC METABOLIC PANEL      Status: Abnormal   Collection Time   01/27/12  4:25 AM      Component Value Range   Sodium 140  135 - 145 mEq/L   Potassium 3.4 (*) 3.5 - 5.1 mEq/L   Chloride 104  96 - 112 mEq/L   CO2 27  19 - 32 mEq/L   Glucose, Bld 91  70 - 99 mg/dL   BUN 35 (*) 6 - 23 mg/dL   Creatinine, Ser 4.09 (*) 0.50 - 1.35 mg/dL   Calcium 9.2  8.4 - 81.1 mg/dL   GFR calc non Af Amer 29 (*) >90 mL/min   GFR calc Af Amer 34 (*) >90 mL/min  HEPARIN LEVEL (UNFRACTIONATED)     Status: Normal   Collection Time   01/27/12  4:25 AM      Component Value Range   Heparin Unfractionated 0.68  0.30 -  0.70 IU/mL  CBC     Status: Abnormal   Collection Time   01/27/12  4:25 AM      Component Value Range   WBC 6.5  4.0 - 10.5 K/uL   RBC 3.95 (*) 4.22 - 5.81 MIL/uL   Hemoglobin 11.7 (*) 13.0 - 17.0 g/dL   HCT 16.1 (*) 09.6 - 04.5 %   MCV 88.1  78.0 - 100.0 fL   MCH 29.6  26.0 - 34.0 pg   MCHC 33.6  30.0 - 36.0 g/dL   RDW 40.9  81.1 - 91.4 %   Platelets 224  150 - 400 K/uL  PROTIME-INR     Status: Abnormal   Collection Time   01/27/12  4:25 AM      Component Value Range   Prothrombin Time 24.8 (*) 11.6 - 15.2 seconds   INR 2.37 (*) 0.00 - 1.49  PRO B NATRIURETIC PEPTIDE     Status: Abnormal   Collection Time   01/27/12  4:25 AM      Component Value Range   Pro B Natriuretic peptide (BNP) 2775.0 (*) 0 - 125 pg/mL  CARBOXYHEMOGLOBIN     Status: Abnormal   Collection Time   01/27/12  5:00 AM      Component Value Range   Total hemoglobin 11.8 (*) 13.5 - 18.0 g/dL   O2 Saturation 78.2     Carboxyhemoglobin 1.3  0.5 - 1.5 %   Methemoglobin 1.3  0.0 - 1.5 %    Intake/Output Summary (Last 24 hours) at 01/27/12 0837 Last data filed at 01/27/12 0700  Gross per 24 hour  Intake 1907.59 ml  Output   1975 ml  Net -67.41 ml   Right heart cath 1/24         1/25 RA = 14         19 RV = 51/16/15  PA = 57/34 (43)        54/40 PCW = 26         36 Fick cardiac output/index = 4.2/1.8       Thermo CO/CI = 3.9/1.7  PVR =  4.0 Woods  O2 sat = 97%  PA sat = 57%, 58%  Ao Pressure (non-invasive): 116/80 (93)  SVR = 1504        1222  ASSESSMENT AND PLAN:  A/C systolic heart failure EF 20%:  Swan readings as above.  No significant change.  Continue milrinone.  He is on BiDil.  I will increase the dose of hydralazine nitrates today but not double the dose yet.  Chronic renal failure:  Creat up slightly.  We will follow and treat in the context of treating the HF.   VT s/p ICD   Chronic AF  On systemic heparin.  Rate OK.    Hypokalemia:  Supplement.     Rollene Rotunda 01/27/2012 8:37 AM

## 2012-01-27 NOTE — Progress Notes (Signed)
Pt had 7 beat run of v tach.  Pt asymptomatic.  Called and notified dr. Algie Coffer and orders received.

## 2012-01-27 NOTE — Progress Notes (Signed)
Right arm pain.  EKG completed. No changes. No respiratory distress.  Patient stated that he felt he might have over did it today with bathing and company visiting.

## 2012-01-27 NOTE — Progress Notes (Signed)
ANTICOAGULATION CONSULT NOTE - Follow Up Consult  Pharmacy Consult for Heparin and Coumadin Indication: atrial fibrillation  Allergies  Allergen Reactions  . Ativan (Lorazepam) Other (See Comments)    Didn't like the way it made him feel; hallucinations, talking to himself    Patient Measurements: Height: 6\' 3"  (190.5 cm) Weight: 226 lb 3.1 oz (102.6 kg) IBW/kg (Calculated) : 84.5  Heparin Dosing Weight: 105.2 kg  Vital Signs: Temp: 98.1 F (36.7 C) (01/25 0900) BP: 121/71 mmHg (01/25 0800) Pulse Rate: 102  (01/25 0900)  Labs:  Basename 01/27/12 0425 01/26/12 0200 01/26/12 0145 01/25/12 2006 01/25/12 2005 01/25/12 1401  HGB 11.7* 13.2 -- -- -- --  HCT 34.8* 39.8 -- -- 36.9* --  PLT 224 265 -- -- 279 --  APTT -- -- -- -- 39* 37  LABPROT 24.8* 18.6* -- -- 18.0* --  INR 2.37* 1.61* -- -- 1.54* --  HEPARINUNFRC 0.68 0.38 -- -- -- --  CREATININE 2.26* 2.04* -- -- 2.07* --  CKTOTAL -- -- -- -- -- --  CKMB -- -- -- -- -- --  TROPONINI -- -- <0.30 <0.30 -- <0.30    Estimated Creatinine Clearance: 44 ml/min (by C-G formula based on Cr of 2.26).   Medical History: Assessment: 62 y/o male sent from [place] to the ED for edema, chest pain, and SOB. Pharmacy consulted to manage heparin and Coumadin for Afib. INR (2.4) now at goal. Heparin level (0.68) is at-goal on 1550 units/hr, can now be D/Ced due to therapeutic INR.   Goal of Therapy:  INR 2-3 Heparin level 0.3-0.7 units/ml Monitor platelets by anticoagulation protocol: Yes   Plan:  1. Stop IV heparin drip.   2. Coumadin 10mg  po at 18:00 today.  3. Daily INR   Vania Rea. Darin Engels.D. Clinical Pharmacist Pager [phone] Phone [phone] 01/27/2012 10:28 AM

## 2012-01-27 NOTE — Progress Notes (Signed)
Subjective:  Feeling better. Afebrile. Mild worsening of renal failure.  Objective:  Vital Signs in the last 24 hours: Temp:  [97.5 F (36.4 C)-98.4 F (36.9 C)] 98.4 F (36.9 C) (01/25 0700) Pulse Rate:  [29-110] 105  (01/25 0700) Cardiac Rhythm:  [-] Atrial fibrillation (01/25 0400) Resp:  [14-30] 24  (01/25 0700) BP: (104-127)/(60-101) 121/69 mmHg (01/25 0700) SpO2:  [91 %-100 %] 100 % (01/25 0700) Weight:  [102.6 kg (226 lb 3.1 oz)] 102.6 kg (226 lb 3.1 oz) (01/25 0500)  Physical Exam: BP Readings from Last 1 Encounters:  01/27/12 121/69    Wt Readings from Last 1 Encounters:  01/27/12 102.6 kg (226 lb 3.1 oz)    Weight change: -2.635 kg (-5 lb 12.9 oz)  HEENT: [place]/AT, Eyes-Brown, PERL, EOMI, Conjunctiva-Pale pink, Sclera-Non-icteric. Dry tongue Neck: + JVD, No bruit, Trachea midline. Lungs:  Clearing, Bilateral. Cardiac:  Irregular rhythm, normal S1 and S2, no S3. II/VI systolic murmur. Abdomen:  Soft, non-tender. Extremities:  No edema present. No cyanosis. No clubbing. CNS: AxOx3, Cranial nerves grossly intact, moves all 4 extremities. Right handed. Skin: Warm and dry.   Intake/Output from previous day: 01/24 0701 - 01/25 0700 In: 1907.6 [P.O.:700; I.V.:1183.6; IV Piggyback:24] Out: 1975 [Urine:1975]    Lab Results: BMET    Component Value Date/Time   NA 140 01/27/2012 0425   K 3.4* 01/27/2012 0425   CL 104 01/27/2012 0425   CO2 27 01/27/2012 0425   GLUCOSE 91 01/27/2012 0425   BUN 35* 01/27/2012 0425   CREATININE 2.26* 01/27/2012 0425   CALCIUM 9.2 01/27/2012 0425   GFRNONAA 29* 01/27/2012 0425   GFRAA 34* 01/27/2012 0425   CBC    Component Value Date/Time   WBC 6.5 01/27/2012 0425   RBC 3.95* 01/27/2012 0425   HGB 11.7* 01/27/2012 0425   HCT 34.8* 01/27/2012 0425   PLT 224 01/27/2012 0425   MCV 88.1 01/27/2012 0425   MCH 29.6 01/27/2012 0425   MCHC 33.6 01/27/2012 0425   RDW 13.9 01/27/2012 0425   LYMPHSABS 3.3 01/25/2012 2005   MONOABS 0.9 01/25/2012 2005   EOSABS 0.3 01/25/2012 2005   BASOSABS 0.0 01/25/2012 2005   CARDIAC ENZYMES Lab Results  Component Value Date   CKTOTAL 50 10/10/2011   CKMB 2.6 10/10/2011   TROPONINI <0.30 01/26/2012    Scheduled Meds:   . allopurinol  100 mg Oral Daily  . amiodarone  200 mg Oral Daily  . atorvastatin  20 mg Oral q1800  . carvedilol  3.125 mg Oral BID WC  . furosemide  80 mg Oral BID  . isosorbide-hydrALAZINE  1 tablet Oral BID  . pantoprazole  40 mg Oral Daily  . polyethylene glycol  17 g Oral BID  . potassium chloride  20 mEq Oral Once  . potassium chloride  40 mEq Oral BID  . sodium chloride  3 mL Intravenous Q12H  . Warfarin - Pharmacist Dosing Inpatient   Does not apply q1800   Continuous Infusions:   . sodium chloride 20 mL/hr (01/26/12 1954)  . heparin 1,550 Units/hr (01/26/12 1744)  . 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 Increase K+ and coreg.   LOS: 2 days    Darrell Cobb  MD  01/27/2012, 8:59 AM

## 2012-01-27 NOTE — Progress Notes (Signed)
Patient had episodes of V-Tach while giving self a bed bath in bedside chair.  Became SOB.  Took frequent breaks during bath.  Patient was hooked back up to [place] 2L.  Patient returned to normal once placed back in bed.  Contacted Dr. Algie Coffer concerning V-Tach episodes. Orders received.

## 2012-01-28 LAB — CARBOXYHEMOGLOBIN
Methemoglobin: 1.2 % (ref 0.0–1.5)
O2 Saturation: 44.7 %
Total hemoglobin: 11.8 g/dL — ABNORMAL LOW (ref 13.5–18.0)

## 2012-01-28 LAB — CBC WITH DIFFERENTIAL/PLATELET
Basophils Absolute: 0 10*3/uL (ref 0.0–0.1)
Lymphocytes Relative: 42 % (ref 12–46)
Neutro Abs: 1.9 10*3/uL (ref 1.7–7.7)
Platelets: 207 10*3/uL (ref 150–400)
RDW: 13.9 % (ref 11.5–15.5)
WBC: 5.7 10*3/uL (ref 4.0–10.5)

## 2012-01-28 LAB — MAGNESIUM: Magnesium: 2 mg/dL (ref 1.5–2.5)

## 2012-01-28 LAB — BASIC METABOLIC PANEL
BUN: 28 mg/dL — ABNORMAL HIGH (ref 6–23)
Chloride: 102 mEq/L (ref 96–112)
Creatinine, Ser: 2.13 mg/dL — ABNORMAL HIGH (ref 0.50–1.35)
GFR calc non Af Amer: 32 mL/min — ABNORMAL LOW (ref >90)
Glucose, Bld: 97 mg/dL (ref 70–99)
Potassium: 4.4 mEq/L (ref 3.5–5.1)

## 2012-01-28 MED ORDER — WARFARIN SODIUM 2 MG PO TABS
2.0000 mg | ORAL_TABLET | Freq: Once | ORAL | Status: AC
  Administered 2012-01-28: 2 mg via ORAL
  Filled 2012-01-28: qty 1

## 2012-01-28 MED ORDER — ISOSORB DINITRATE-HYDRALAZINE 20-37.5 MG PO TABS
1.5000 | ORAL_TABLET | Freq: Three times a day (TID) | ORAL | Status: DC
  Administered 2012-01-28 (×3): 1.5 via ORAL
  Filled 2012-01-28 (×7): qty 1.5

## 2012-01-28 MED ORDER — HYDROMORPHONE HCL PF 1 MG/ML IJ SOLN
1.0000 mg | Freq: Two times a day (BID) | INTRAMUSCULAR | Status: DC | PRN
  Administered 2012-01-28 – 2012-02-04 (×11): 1 mg via INTRAVENOUS
  Filled 2012-01-28 (×12): qty 1

## 2012-01-28 MED ORDER — ISOSORB DINITRATE-HYDRALAZINE 20-37.5 MG PO TABS
2.0000 | ORAL_TABLET | Freq: Two times a day (BID) | ORAL | Status: DC
  Filled 2012-01-28 (×2): qty 2

## 2012-01-28 MED ORDER — HYDROMORPHONE HCL PF 1 MG/ML IJ SOLN
1.0000 mg | Freq: Once | INTRAMUSCULAR | Status: AC
  Administered 2012-01-28: 1 mg via INTRAVENOUS
  Filled 2012-01-28: qty 1

## 2012-01-28 NOTE — Progress Notes (Signed)
Subjective:  Good diuresis and breathing better. Afebrile.  Objective:  Vital Signs in the last 24 hours: Temp:  [97.5 F (36.4 C)-98.6 F (37 C)] 97.9 F (36.6 C) (01/26 1400) Pulse Rate:  [35-94] 82  (01/26 1400) Cardiac Rhythm:  [-] Atrial fibrillation (01/26 0855) Resp:  [13-27] 15  (01/26 1400) BP: (91-125)/(54-88) 101/71 mmHg (01/26 1400) SpO2:  [82 %-100 %] 99 % (01/26 1400) Weight:  [104.7 kg (230 lb 13.2 oz)] 104.7 kg (230 lb 13.2 oz) (01/26 0500)  Physical Exam: BP Readings from Last 1 Encounters:  01/28/12 101/71    Wt Readings from Last 1 Encounters:  01/28/12 104.7 kg (230 lb 13.2 oz)    Weight change: 2.1 kg (4 lb 10.1 oz)  HEENT: [place]/AT, Eyes-Brown, PERL, EOMI, Conjunctiva-Pale pink, Sclera-Non-icteric Neck: + JVD, No bruit, Trachea midline. Lungs:  Clear, Bilateral. Cardiac:  Regular rhythm, normal S1 and S2, no S3. II/VIsystolic murmur. Abdomen:  Soft, non-tender. Extremities:  No edema present. No cyanosis. No clubbing. CNS: AxOx3, Cranial nerves grossly intact, moves all 4 extremities. Right handed. Skin: Warm and dry.   Intake/Output from previous day: 01/25 0701 - 01/26 0700 In: 2045.7 [P.O.:1200; I.V.:837.7; IV Piggyback:8] Out: 3055 [Urine:3055]    Lab Results: BMET    Component Value Date/Time   NA 136 01/28/2012 0445   K 4.4 01/28/2012 0445   CL 102 01/28/2012 0445   CO2 28 01/28/2012 0445   GLUCOSE 97 01/28/2012 0445   BUN 28* 01/28/2012 0445   CREATININE 2.13* 01/28/2012 0445   CALCIUM 9.5 01/28/2012 0445   GFRNONAA 32* 01/28/2012 0445   GFRAA 37* 01/28/2012 0445   CBC    Component Value Date/Time   WBC 5.7 01/28/2012 0445   RBC 4.05* 01/28/2012 0445   HGB 11.8* 01/28/2012 0445   HCT 35.9* 01/28/2012 0445   PLT 207 01/28/2012 0445   MCV 88.6 01/28/2012 0445   MCH 29.1 01/28/2012 0445   MCHC 32.9 01/28/2012 0445   RDW 13.9 01/28/2012 0445   LYMPHSABS 2.4 01/28/2012 0445   MONOABS 0.9 01/28/2012 0445   EOSABS 0.5 01/28/2012 0445   BASOSABS 0.0  01/28/2012 0445   CARDIAC ENZYMES Lab Results  Component Value Date   CKTOTAL 50 10/10/2011   CKMB 2.6 10/10/2011   TROPONINI <0.30 01/26/2012    Scheduled Meds:   . allopurinol  100 mg Oral Daily  . amiodarone  200 mg Oral Daily  . atorvastatin  20 mg Oral q1800  . carvedilol  6.25 mg Oral BID WC  . furosemide  80 mg Oral BID  . isosorbide-hydrALAZINE  1.5 tablet Oral TID  . pantoprazole  40 mg Oral Daily  . polyethylene glycol  17 g Oral BID  . potassium chloride  40 mEq Oral BID  . sodium chloride  3 mL Intravenous Q12H  . warfarin  2 mg Oral ONCE-1800  . Warfarin - Pharmacist Dosing Inpatient   Does not apply q1800   Continuous Infusions:   . sodium chloride 20 mL/hr at 01/28/12 1000  . 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

## 2012-01-28 NOTE — Progress Notes (Signed)
ANTICOAGULATION CONSULT NOTE - Follow Up Consult  Pharmacy Consult for Coumadin Indication: atrial fibrillation  Allergies  Allergen Reactions  . Ativan (Lorazepam) Other (See Comments)    Didn't like the way it made him feel; hallucinations, talking to himself    Patient Measurements: Height: 6\' 3"  (190.5 cm) Weight: 230 lb 13.2 oz (104.7 kg) IBW/kg (Calculated) : 84.5  Heparin Dosing Weight: 105.2 kg  Vital Signs: Temp: 97.9 F (36.6 C) (01/26 1400) BP: 101/71 mmHg (01/26 1400) Pulse Rate: 82  (01/26 1400)  Labs:  Basename 01/28/12 0445 01/27/12 0425 01/26/12 0200 01/26/12 0145 01/25/12 2006 01/25/12 2005  HGB 11.8* 11.7* -- -- -- --  HCT 35.9* 34.8* 39.8 -- -- --  PLT 207 224 265 -- -- --  APTT -- -- -- -- -- 39*  LABPROT 34.6* 24.8* 18.6* -- -- --  INR 3.71* 2.37* 1.61* -- -- --  HEPARINUNFRC -- 0.68 0.38 -- -- --  CREATININE 2.13* 2.26* 2.04* -- -- --  CKTOTAL -- -- -- -- -- --  CKMB -- -- -- -- -- --  TROPONINI -- -- -- <0.30 <0.30 --    Estimated Creatinine Clearance: 47.1 ml/min (by C-G formula based on Cr of 2.13).   Medical History: Assessment: 62 y/o male sent from [place] to the ED for edema, chest pain, and SOB. Pharmacy consulted to manage Coumadin for Afib. INR (3.7) above goal today. She is on 10mg  daily at home.  Goal of Therapy:  INR 2-3 Heparin level 0.3-0.7 units/ml Monitor platelets by anticoagulation protocol: Yes   Plan:  1. Coumadin 2 mg po at 18:00 today.  2. Daily INR 3. Monitor for s/sx of bleeding   Vania Rea. Darin Engels.D. Clinical Pharmacist Pager [phone] Phone [phone] 01/28/2012 2:53 PM

## 2012-01-28 NOTE — Progress Notes (Signed)
Advanced Heart Failure Team Consult Note  SUBJECTIVE:  Seven beat run of V tach noted.   He feels better today since he got pain med for his neck yesterday.  Denies SOB.   PHYSICAL EXAM Filed Vitals:   01/28/12 0500 01/28/12 0600 01/28/12 0623 01/28/12 0700  BP: 122/83 125/84  114/75  Pulse: 65 86 86 86  Temp: 98.1 F (36.7 C) 97.9 F (36.6 C) 98.1 F (36.7 C) 97.9 F (36.6 C)  TempSrc:      Resp: 22 19 20 21   Height:      Weight: 230 lb 13.2 oz (104.7 kg)     SpO2: 86% 83% 98% 94%   General:  No distress HEENT  PERRL Neck  Swan intact without erythema or bleeding Lungs:  Few basilar crackles, overall decreased breath sounds Heart:  Irregular Abdomen:  Positive bowel sounds, no rebound no guarding Extremities:  No edema NEURO  Nonfocal  LABS: Lab Results  Component Value Date   CKTOTAL 50 10/10/2011   CKMB 2.6 10/10/2011   TROPONINI <0.30 01/26/2012   Results for orders placed during the hospital encounter of 01/25/12 (from the past 24 hour(s))  BASIC METABOLIC PANEL     Status: Abnormal   Collection Time   01/28/12  4:45 AM      Component Value Range   Sodium 136  135 - 145 mEq/L   Potassium 4.4  3.5 - 5.1 mEq/L   Chloride 102  96 - 112 mEq/L   CO2 28  19 - 32 mEq/L   Glucose, Bld 97  70 - 99 mg/dL   BUN 28 (*) 6 - 23 mg/dL   Creatinine, Ser 1.61 (*) 0.50 - 1.35 mg/dL   Calcium 9.5  8.4 - 09.6 mg/dL   GFR calc non Af Amer 32 (*) >90 mL/min   GFR calc Af Amer 37 (*) >90 mL/min  PROTIME-INR     Status: Abnormal   Collection Time   01/28/12  4:45 AM      Component Value Range   Prothrombin Time 34.6 (*) 11.6 - 15.2 seconds   INR 3.71 (*) 0.00 - 1.49  CBC WITH DIFFERENTIAL     Status: Abnormal   Collection Time   01/28/12  4:45 AM      Component Value Range   WBC 5.7  4.0 - 10.5 K/uL   RBC 4.05 (*) 4.22 - 5.81 MIL/uL   Hemoglobin 11.8 (*) 13.0 - 17.0 g/dL   HCT 04.5 (*) 40.9 - 81.1 %   MCV 88.6  78.0 - 100.0 fL   MCH 29.1  26.0 - 34.0 pg   MCHC 32.9  30.0 -  36.0 g/dL   RDW 91.4  78.2 - 95.6 %   Platelets 207  150 - 400 K/uL   Neutrophils Relative 34 (*) 43 - 77 %   Neutro Abs 1.9  1.7 - 7.7 K/uL   Lymphocytes Relative 42  12 - 46 %   Lymphs Abs 2.4  0.7 - 4.0 K/uL   Monocytes Relative 16 (*) 3 - 12 %   Monocytes Absolute 0.9  0.1 - 1.0 K/uL   Eosinophils Relative 8 (*) 0 - 5 %   Eosinophils Absolute 0.5  0.0 - 0.7 K/uL   Basophils Relative 0  0 - 1 %   Basophils Absolute 0.0  0.0 - 0.1 K/uL  MAGNESIUM     Status: Normal   Collection Time   01/28/12  4:45 AM  Component Value Range   Magnesium 2.0  1.5 - 2.5 mg/dL  CARBOXYHEMOGLOBIN     Status: Abnormal   Collection Time   01/28/12  5:00 AM      Component Value Range   Total hemoglobin 11.8 (*) 13.5 - 18.0 g/dL   O2 Saturation 96.0     Carboxyhemoglobin 1.1  0.5 - 1.5 %   Methemoglobin 1.2  0.0 - 1.5 %    Intake/Output Summary (Last 24 hours) at 01/28/12 0840 Last data filed at 01/28/12 0700  Gross per 24 hour  Intake 1676.1 ml  Output   3055 ml  Net -1378.9 ml   Right heart cath 1/24         1/26 RA = 14         17 RV = 51/16/15  PA = 57/34 (43)        53/31 PCW = 26         22 Fick cardiac output/index = 4.2/1.8       Thermo CO/CI = 3.9/1.7  PVR = 4.0 Woods  O2 sat = 97%  PA sat = 57%, 58%  Ao Pressure (non-invasive): 116/80 (93)  SVR = 1504        1613  ASSESSMENT AND PLAN:  A/C systolic heart failure EF 20%:  Swan readings as above.  No significant change.  I will leave the Milrinone at current dosing with his renal insufficiency .  He is on BiDil and tolerated an increased dose yesterday.  I will change him to this dose at three times per day per A-HeFT protocol.   Dr. Algie Coffer switched to PO Lasix yesterday.   Chronic renal failure:  Creat stable.   VT s/p ICD   Chronic AF:  On therapeutic warfarin per pharmacy.    Hypokalemia:  Supplemented.     Fayrene Fearing [facility] 01/28/2012 8:40 AM

## 2012-01-29 DIAGNOSIS — N189 Chronic kidney disease, unspecified: Secondary | ICD-10-CM

## 2012-01-29 DIAGNOSIS — I4891 Unspecified atrial fibrillation: Secondary | ICD-10-CM

## 2012-01-29 LAB — CBC
Hemoglobin: 10 g/dL — ABNORMAL LOW (ref 13.0–17.0)
RBC: 3.47 MIL/uL — ABNORMAL LOW (ref 4.22–5.81)
WBC: 4.8 10*3/uL (ref 4.0–10.5)

## 2012-01-29 LAB — CARBOXYHEMOGLOBIN
Carboxyhemoglobin: 1.1 % (ref 0.5–1.5)
O2 Saturation: 54.4 %

## 2012-01-29 LAB — PROTIME-INR
INR: 4 — ABNORMAL HIGH (ref 0.00–1.49)
Prothrombin Time: 36.6 seconds — ABNORMAL HIGH (ref 11.6–15.2)

## 2012-01-29 LAB — BASIC METABOLIC PANEL
BUN: 25 mg/dL — ABNORMAL HIGH (ref 6–23)
Chloride: 101 mEq/L (ref 96–112)
Creatinine, Ser: 2 mg/dL — ABNORMAL HIGH (ref 0.50–1.35)
GFR calc Af Amer: 39 mL/min — ABNORMAL LOW (ref >90)
GFR calc non Af Amer: 34 mL/min — ABNORMAL LOW (ref >90)

## 2012-01-29 MED ORDER — METOLAZONE 5 MG PO TABS
5.0000 mg | ORAL_TABLET | Freq: Once | ORAL | Status: AC
  Administered 2012-01-29: 5 mg via ORAL
  Filled 2012-01-29: qty 1

## 2012-01-29 MED ORDER — FUROSEMIDE 80 MG PO TABS
80.0000 mg | ORAL_TABLET | Freq: Two times a day (BID) | ORAL | Status: DC
  Filled 2012-01-29: qty 1

## 2012-01-29 MED ORDER — ISOSORBIDE MONONITRATE ER 30 MG PO TB24
30.0000 mg | ORAL_TABLET | Freq: Every day | ORAL | Status: DC
  Administered 2012-01-29 – 2012-02-05 (×8): 30 mg via ORAL
  Filled 2012-01-29 (×9): qty 1

## 2012-01-29 MED ORDER — HYDRALAZINE HCL 25 MG PO TABS
75.0000 mg | ORAL_TABLET | Freq: Three times a day (TID) | ORAL | Status: DC
  Administered 2012-01-29 – 2012-02-05 (×22): 75 mg via ORAL
  Filled 2012-01-29 (×24): qty 1

## 2012-01-29 MED ORDER — FUROSEMIDE 10 MG/ML IJ SOLN
40.0000 mg | Freq: Once | INTRAMUSCULAR | Status: AC
  Administered 2012-01-29: 40 mg via INTRAVENOUS
  Filled 2012-01-29: qty 4

## 2012-01-29 MED ORDER — FUROSEMIDE 10 MG/ML IJ SOLN
80.0000 mg | Freq: Three times a day (TID) | INTRAMUSCULAR | Status: DC
  Administered 2012-01-29 (×2): 80 mg via INTRAVENOUS
  Filled 2012-01-29 (×5): qty 8

## 2012-01-29 MED ORDER — FUROSEMIDE 10 MG/ML IJ SOLN: 40.0000 mg | Freq: Two times a day (BID) | INTRAMUSCULAR | Status: DC

## 2012-01-29 NOTE — Progress Notes (Signed)
Subjective:   patient denies any chest pain states breathing is improved with restarting IV Lasix with good diuresis Objective:  Vital Signs in the last 24 hours: Temp:  [97.7 F (36.5 C)-98.6 F (37 C)] 98.4 F (36.9 C) (01/27 1800) Pulse Rate:  [40-99] 84  (01/27 1800) Resp:  [11-26] 24  (01/27 1800) BP: (98-131)/(61-97) 106/76 mmHg (01/27 1700) SpO2:  [85 %-100 %] 96 % (01/27 1800) Weight:  [102.9 kg (226 lb 13.7 oz)] 102.9 kg (226 lb 13.7 oz) (01/27 0500)  Intake/Output from previous day: 01/26 0701 - 01/27 0700 In: 1366.6 [P.O.:620; I.V.:746.6] Out: 1975 [Urine:1975] Intake/Output from this shift: Total I/O In: 1555.6 [P.O.:1200; I.V.:347.6; IV Piggyback:8] Out: 3330 [Urine:3330]  Physical Exam: Neck: no adenopathy, no carotid bruit, supple, symmetrical, trachea midline and JVD plus swan site okay Lungs: Clear to auscultation anterolaterally Heart: irregularly irregular rhythm, S1, S2 normal and Soft systolic murmur and S3 gallop noted Abdomen: soft, non-tender; bowel sounds normal; no masses,  no organomegaly Extremities: extremities normal, atraumatic, no cyanosis or edema  Lab Results:  Basename 01/29/12 0500 01/28/12 0445  WBC 4.8 5.7  HGB 10.0* 11.8*  PLT 163 207    Basename 01/29/12 0719 01/28/12 0445  NA 135 136  K 4.7 4.4  CL 101 102  CO2 27 28  GLUCOSE 95 97  BUN 25* 28*  CREATININE 2.00* 2.13*   No results found for this basename: TROPONINI:2,CK,MB:2 in the last 72 hours Hepatic Function Panel No results found for this basename: PROT,ALBUMIN,AST,ALT,ALKPHOS,BILITOT,BILIDIR,IBILI in the last 72 hours No results found for this basename: CHOL in the last 72 hours No results found for this basename: PROTIME in the last 72 hours  Imaging: Imaging results have been reviewed and No results found.  Cardiac Studies:  Assessment/Plan:  Resolving decompensated systolic heart failure Status post right heart cath Severe nonischemic dilated cardiomyopathy  status post ICD  Hypertension  Hypercholesteremia  Chronic kidney disease stage IV  Chronic atrial fibrillation  History of nonsustained VT in  History of sarcoidosis  History of Cocaine abuse  Plan Agree with present management Check labs in a.m.  LOS: 4 days    Tangi Shroff N 01/29/2012, 6:17 PM

## 2012-01-29 NOTE — Progress Notes (Signed)
Advanced Heart Failure Rounding Note   Subjective:    [name] is a 62 y.o. gentlemen with multiple hospital admissions for HF exacerbations. 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 He also has history of ventricular tachycardia s/p [employer] ICD by [name], chronic kidney disease: stage III, baseline Cr ~2, and paroxysmal atrial arrhythmias controlled by amiodarone. As well as sarcoidosis, hypertension. Cath in 2008 by Dr. [name] showed no CAD. Dry weight is 219 (11/2011).   Primary cardiologist [name]. Admitted with NYHA IIIB/IV HF with low out put symptoms. RHC performed with low output. Milrinone post cath. Over the weekend BIDIL titrated up and he was switched to po lasix.  Overall weight down 6 pounds. 24 hour I/O -671.   CO-OX 44>54   RHC 1/2 4/14 RA = 14  RV = 51/16/15  PA = 57/34 (43)  PCW = 26  Fick cardiac output/index = 4.2/1.8  Thermo CO/CI = 3.9/1.7  PVR = 4.0 Woods  O2 sat = 97%  PA sat = 57%, 58%  Ao Pressure (non-invasive): 116/80 (93)  SVR = 1504   Swan numbers today (checked personally)  PA 63/39 (44) PAWP 44 SVR 1633 CVP 19 CO 3.28 CI 1.44  Over weekend was switched back to po lasix. Remains SOB with exertion. Denies Orthopnea/PND/CP. Co-ox remains marginal.   Objective:   Weight Range:  Vital Signs:   Temp:  [97.7 F (36.5 C)-98.6 F (37 C)] 98.1 F (36.7 C) (01/27 0500) Pulse Rate:  [42-99] 98  (01/27 0500) Resp:  [13-27] 19  (01/27 0500) BP: (98-124)/(54-97) 121/82 mmHg (01/27 0500) SpO2:  [82 %-100 %] 97 % (01/27 0500) Weight:  [226 lb 13.7 oz (102.9 kg)] 226 lb 13.7 oz (102.9 kg) (01/27 0500) Last BM Date: 01/26/12  Weight change: Filed Weights   01/27/12 0500 01/28/12 0500 01/29/12 0500  Weight: 226 lb 3.1 oz (102.6 kg) 230 lb 13.2 oz (104.7 kg) 226 lb 13.7 oz (102.9 kg)    Intake/Output:   Intake/Output  Summary (Last 24 hours) at 01/29/12 0711 Last data filed at 01/29/12 0500  Gross per 24 hour  Intake 1303.37 ml  Output   1975 ml  Net -671.63 ml     Physical Exam:  General: Well appearing. No resp difficulty  HEENT: normal  Neck: supple. JVP elevated. Carotids 2+ bilat; no bruits. No lymphadenopathy or thryomegaly appreciated. R IJ swan Cor: PMI nondisplaced. Regular rate & rhythm. 3/64m MR. +S3  Lungs: clear 2 liters  Abdomen: soft, nontender, obese, + distention. Palpable liver edge. No bruits or masses. Good bowel sounds.  Extremities: no cyanosis, clubbing, rash, trace lower extremity edema  Neuro: alert & orientedx3, cranial nerves grossly intact. moves all 4 extremities w/o difficulty. Affect pleasant     Telemetry: A Fib 98  Labs: Basic Metabolic Panel:  Lab 01/28/12 1610 01/27/12 0425 01/26/12 0200 01/25/12 2005 01/25/12 1401  NA 136 140 142 141 141  K 4.4 3.4* 4.2 3.7 3.8  CL 102 104 104 103 103  CO2 28 27 22 23 19   GLUCOSE 97 91 85 79 87  BUN 28* 35* 39* 39* 39*  CREATININE 2.13* 2.26* 2.04* 2.07* 2.13*  CALCIUM 9.5 9.2 9.9 -- --  MG 2.0 -- -- 1.9 --  PHOS -- -- -- -- --    Liver Function Tests:  Lab 01/25/12 2005 01/25/12 1401  AST 108* 100*  ALT 68* 63*  ALKPHOS 79 79  BILITOT 1.3* 1.0  PROT 8.0 8.1  ALBUMIN 3.4* 3.2*   No results found for this basename: LIPASE:5,AMYLASE:5 in the last 168 hours No results found for this basename: AMMONIA:3 in the last 168 hours  CBC:  Lab 01/29/12 0500 01/28/12 0445 01/27/12 0425 01/26/12 0200 01/25/12 2005  WBC 4.8 5.7 6.5 6.1 6.7  NEUTROABS -- 1.9 -- -- 2.2  HGB 10.0* 11.8* 11.7* 13.2 12.2*  HCT 30.4* 35.9* 34.8* 39.8 36.9*  MCV 87.6 88.6 88.1 89.6 90.2  PLT 163 207 224 265 279    Cardiac Enzymes:  Lab 01/26/12 0145 01/25/12 2006 01/25/12 1401  CKTOTAL -- -- --  CKMB -- -- --  CKMBINDEX -- -- --  TROPONINI <0.30 <0.30 <0.30    BNP: BNP (last 3 results)  Basename 01/27/12 0425 01/25/12 2006  01/25/12 1401  PROBNP 2775.0* 5263.0* 5338.0*     Other results:     Imaging: No results found.   Medications:     Scheduled Medications:    . allopurinol  100 mg Oral Daily  . amiodarone  200 mg Oral Daily  . atorvastatin  20 mg Oral q1800  . carvedilol  6.25 mg Oral BID WC  . furosemide  80 mg Oral BID  . isosorbide-hydrALAZINE  1.5 tablet Oral TID  . pantoprazole  40 mg Oral Daily  . polyethylene glycol  17 g Oral BID  . potassium chloride  40 mEq Oral BID  . sodium chloride  3 mL Intravenous Q12H  . Warfarin - Pharmacist Dosing Inpatient   Does not apply q1800    Infusions:    . sodium chloride 20 mL/hr at 01/28/12 1000  . milrinone 0.375 mcg/kg/min (01/28/12 2241)    PRN Medications: sodium chloride, acetaminophen, ALPRAZolam, HYDROmorphone (DILAUDID) injection, ondansetron (ZOFRAN) IV, oxyCODONE-acetaminophen, sodium chloride   Assessment:  1. A/C systolic heart failure EF 20%  --with multiple readmissions  2. NICM  3. Chronic renal failure  4. H/o VT s/p ICD  5. 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 Chronic AF 7. NSVT    Plan/Discussion:   Continues to have exertional dyspnea with movement in bed. CO/CI remains low and PCWP 26. Continue Milrinone 0.375 mcg. Will give IV lasix 40 mg bid. Overall weight down 6 pounds. Will give IV lasix 40 mg bid. Increase hydralazine 75 mg po bid. Switch to IMDUR 30 mg daily. BMET pending.   Rate controlled. Per pharmacy hold coumadin INR 4  Will need to get out of bed today. Hopefully can start cardiac rehab tomorrow.   Length of Stay: 4   CLEGG,AMY NP-C 01/29/2012, 7:11 AM  Patient seen and examined with Tonye Becket, NP. We discussed all aspects of the encounter. I agree with the assessment and plan as stated above.   Remains volume overloaded. Co-ox marginal still with a way to go. We need to get him dry first. Increase lasix to 80 tid. Give dose of metolazone. Continue milrinone. Agree with  titration of hydralazine/nitrates. Would not start milrinone wean until his volume status improves. Despite inotropes Cr remains > 2.0 which will likely limit advanced therapies  H&H down significantly but no active signs of bleeding. Continue to follow.   Daniel Bensimhon,MD 8:03 AM

## 2012-01-29 NOTE — Progress Notes (Signed)
ANTICOAGULATION CONSULT NOTE - Follow Up Consult  Pharmacy Consult for Coumadin Indication: atrial fibrillation  Allergies  Allergen Reactions  . Ativan (Lorazepam) Other (See Comments)    Didn't like the way it made him feel; hallucinations, talking to himself    Patient Measurements: Height: 6\' 3"  (190.5 cm) Weight: 226 lb 13.7 oz (102.9 kg) IBW/kg (Calculated) : 84.5   Vital Signs: Temp: 98.4 F (36.9 C) (01/27 0900) Temp src: Oral (01/27 0800) BP: 117/75 mmHg (01/27 0900) Pulse Rate: 90  (01/27 0900)  Labs:  Basename 01/29/12 0719 01/29/12 0500 01/28/12 0445 01/27/12 0425  HGB -- 10.0* 11.8* --  HCT -- 30.4* 35.9* 34.8*  PLT -- 163 207 224  APTT -- -- -- --  LABPROT -- 36.6* 34.6* 24.8*  INR -- 4.00* 3.71* 2.37*  HEPARINUNFRC -- -- -- 0.68  CREATININE 2.00* -- 2.13* 2.26*  CKTOTAL -- -- -- --  CKMB -- -- -- --  TROPONINI -- -- -- --    Estimated Creatinine Clearance: 49.8 ml/min (by C-G formula based on Cr of 2).  Assessment: 62yom continues on coumadin for afib. INR remains supratherapeutic. Smaller dose given last night even though INR had jumped. Hgb/Hct/Plts trending down - watch. No bleeding reported.  Goal of Therapy:  INR 2-3 Monitor platelets by anticoagulation protocol: Yes   Plan:  1) No coumadin tonight 2) Follow up INR, CBC in AM  Fredrik Rigger 01/29/2012,10:16 AM

## 2012-01-30 LAB — BASIC METABOLIC PANEL
Calcium: 10.1 mg/dL (ref 8.4–10.5)
Creatinine, Ser: 2.46 mg/dL — ABNORMAL HIGH (ref 0.50–1.35)
GFR calc non Af Amer: 26 mL/min — ABNORMAL LOW (ref >90)
Glucose, Bld: 102 mg/dL — ABNORMAL HIGH (ref 70–99)
Sodium: 133 mEq/L — ABNORMAL LOW (ref 135–145)

## 2012-01-30 LAB — CBC
HCT: 40.2 % (ref 39.0–52.0)
MCV: 85.7 fL (ref 78.0–100.0)
Platelets: 193 10*3/uL (ref 150–400)
RBC: 4.69 MIL/uL (ref 4.22–5.81)
WBC: 5.8 10*3/uL (ref 4.0–10.5)

## 2012-01-30 LAB — CARBOXYHEMOGLOBIN
O2 Saturation: 56.4 %
Total hemoglobin: 14.2 g/dL (ref 13.5–18.0)

## 2012-01-30 MED ORDER — WARFARIN SODIUM 2.5 MG PO TABS
12.5000 mg | ORAL_TABLET | Freq: Once | ORAL | Status: AC
  Administered 2012-01-30: 12.5 mg via ORAL
  Filled 2012-01-30: qty 1

## 2012-01-30 NOTE — Progress Notes (Signed)
Advanced Heart Failure Rounding Note   Subjective:    [name] is a 62 y.o. gentlemen with multiple hospital admissions for HF exacerbations. 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 He also has history of ventricular tachycardia s/p St Jude ICD by [name], chronic kidney disease: stage III, baseline Cr ~2, and paroxysmal atrial arrhythmias controlled by amiodarone. As well as sarcoidosis, hypertension. Cath in 2008 by Dr. [name] showed no CAD. Dry weight is 219 (11/2011).   Primary cardiologist [name]. Admitted with NYHA IIIB/IV HF with low out put symptoms. RHC performed with low output. Milrinone post cath. Over the weekend BIDIL titrated up and he was switched to po lasix.  Yesterday received 80 mg IV lasix every 8 hours and Metolazone. Hydralazine increased to 75 mg tid. Overall weight down 9 pounds. 24 hour I/O -2.3.   CO-OX 44>54>56   RHC 1/2 4/14 RA = 14  RV = 51/16/15  PA = 57/34 (43)  PCW = 26  Fick cardiac output/index = 4.2/1.8  Thermo CO/CI = 3.9/1.7  PVR = 4.0 Woods  O2 sat = 97%  PA sat = 57%, 58%  Ao Pressure (non-invasive): 116/80 (93)  SVR = 1504   Swan numbers today   PA 40/17  (24) PAWP 10 SVR 1197 CVP 5 CO 5.21 CI 2.3  Dyspnea improved. Remains SOB with exertion. Denies Orthopnea/PND/CP.    Objective:   Weight Range:  Vital Signs:   Temp:  [98.1 F (36.7 C)-98.8 F (37.1 C)] 98.4 F (36.9 C) (01/28 0700) Pulse Rate:  [62-105] 73  (01/28 0700) Resp:  [11-25] 18  (01/28 0700) BP: (92-131)/(53-93) 92/53 mmHg (01/28 0700) SpO2:  [92 %-100 %] 97 % (01/28 0500) Weight:  [223 lb 12.3 oz (101.5 kg)] 223 lb 12.3 oz (101.5 kg) (01/28 0400) Last BM Date: 01/26/12  Weight change: Filed Weights   01/28/12 0500 01/29/12 0500 01/30/12 0400  Weight: 230 lb 13.2 oz (104.7 kg) 226 lb 13.7 oz (102.9 kg) 223 lb 12.3 oz (101.5 kg)     Intake/Output:   Intake/Output Summary (Last 24 hours) at 01/30/12 0711 Last data filed at 01/30/12 0700  Gross per 24 hour  Intake 2366.4 ml  Output   4830 ml  Net -2463.6 ml     Physical Exam:  General: Well appearing. No resp difficulty  HEENT: normal  Neck: supple. JVP 5-6. Carotids 2+ bilat; no bruits. No lymphadenopathy or thryomegaly appreciated. R IJ swan Cor: PMI nondisplaced. Regular rate & rhythm. 3/51m MR. +S3  Lungs: clear 2 liters  Abdomen: soft, nontender, obese, + distention. Palpable liver edge. No bruits or masses. Good bowel sounds.  Extremities: no cyanosis, clubbing, rash, trace lower extremity edema  Neuro: alert & orientedx3, cranial nerves grossly intact. moves all 4 extremities w/o difficulty. Affect pleasant     Telemetry: A Fib 98  Labs: Basic Metabolic Panel:  Lab 01/30/12 1191 01/29/12 0719 01/28/12 0445 01/27/12 0425 01/26/12 0200 01/25/12 2005  NA 133* 135 136 140 142 --  K 4.1 4.7 4.4 3.4* 4.2 --  CL 93* 101 102 104 104 --  CO2 29 27 28 27 22  --  GLUCOSE 102* 95 97 91 85 --  BUN 33* 25* 28* 35* 39* --  CREATININE 2.46* 2.00* 2.13* 2.26* 2.04* --  CALCIUM 10.1 9.4 9.5 -- -- --  MG -- -- 2.0 -- -- 1.9  PHOS -- -- -- -- -- --  Liver Function Tests:  Lab 01/25/12 2005 01/25/12 1401  AST 108* 100*  ALT 68* 63*  ALKPHOS 79 79  BILITOT 1.3* 1.0  PROT 8.0 8.1  ALBUMIN 3.4* 3.2*   No results found for this basename: LIPASE:5,AMYLASE:5 in the last 168 hours No results found for this basename: AMMONIA:3 in the last 168 hours  CBC:  Lab 01/30/12 0326 01/29/12 0500 01/28/12 0445 01/27/12 0425 01/26/12 0200 01/25/12 2005  WBC 5.8 4.8 5.7 6.5 6.1 --  NEUTROABS -- -- 1.9 -- -- 2.2  HGB 13.8 10.0* 11.8* 11.7* 13.2 --  HCT 40.2 30.4* 35.9* 34.8* 39.8 --  MCV 85.7 87.6 88.6 88.1 89.6 --  PLT 193 163 207 224 265 --    Cardiac Enzymes:  Lab 01/26/12 0145 01/25/12 2006 01/25/12 1401  CKTOTAL -- -- --  CKMB -- -- --  CKMBINDEX -- --  --  TROPONINI <0.30 <0.30 <0.30    BNP: BNP (last 3 results)  Basename 01/30/12 0326 01/27/12 0425 01/25/12 2006  PROBNP 2314.0* 2775.0* 5263.0*     Other results:     Imaging: No results found.   Medications:     Scheduled Medications:    . allopurinol  100 mg Oral Daily  . amiodarone  200 mg Oral Daily  . atorvastatin  20 mg Oral q1800  . carvedilol  6.25 mg Oral BID WC  . furosemide  80 mg Intravenous TID PC  . hydrALAZINE  75 mg Oral TID  . isosorbide mononitrate  30 mg Oral Daily  . pantoprazole  40 mg Oral Daily  . polyethylene glycol  17 g Oral BID  . sodium chloride  3 mL Intravenous Q12H  . Warfarin - Pharmacist Dosing Inpatient   Does not apply q1800    Infusions:    . sodium chloride 20 mL/hr at 01/28/12 1000  . milrinone 0.375 mcg/kg/min (01/30/12 0134)    PRN Medications: sodium chloride, acetaminophen, ALPRAZolam, HYDROmorphone (DILAUDID) injection, ondansetron (ZOFRAN) IV, oxyCODONE-acetaminophen, sodium chloride   Assessment:  1. A/C systolic heart failure EF 20%  --with multiple readmissions  2. NICM  3. Chronic renal failure  4. H/o VT s/p ICD  5. 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 Chronic AF 7. NSVT 8. Gout    Plan/Discussion:   Brisk diuresis noted over the lat 24 hours. CO/CI improved. PCWP 10.  Continue Milrinone 0.375 mcg. Start Milrinone wean tomorrow. If unable to wean Milrionone will need PICC placed.  Hold Lasix today due creatinine bump and reduced CVP. Overnight weight down 3 pounds. Hold off on beta blocker. No spironolactone/Ace due to renal failure.   Rate controlled. Per pharmacy. Resume Coumadin today INR 2.2  Consult cardiac rehab. Length of Stay: 5   CLEGG,AMY NP-C 01/30/2012, 7:11 AM  Patient seen and examined with Tonye Becket, NP. We discussed all aspects of the encounter. I agree with the assessment and plan as stated above.   Volume status improved.  Now dry.  Cardiac output good. Renal  function worse with diuresis. Will hold lasix today. Continue milrinone at current regimen. Would not start milrinone wean until renal function improves.   Given lack of improvement in renal function with inotropic support, advanced options will be somewhat limited. Will need to focus closely on volume control.   Mekiyah Gladwell,MD 9:53 AM

## 2012-01-30 NOTE — Progress Notes (Signed)
ANTICOAGULATION CONSULT NOTE - Follow Up Consult  Pharmacy Consult for Coumadin Indication: atrial fibrillation  Allergies  Allergen Reactions  . Ativan (Lorazepam) Other (See Comments)    Didn't like the way it made him feel; hallucinations, talking to himself    Patient Measurements: Height: 6\' 3"  (190.5 cm) Weight: 223 lb 12.3 oz (101.5 kg) IBW/kg (Calculated) : 84.5   Vital Signs: Temp: 99.5 F (37.5 C) (01/28 1100) Temp src: Core (Comment) (01/28 0800) BP: 95/81 mmHg (01/28 0900) Pulse Rate: 73  (01/28 1100)  Labs:  Basename 01/30/12 0326 01/29/12 0719 01/29/12 0500 01/28/12 0445  HGB 13.8 -- 10.0* --  HCT 40.2 -- 30.4* 35.9*  PLT 193 -- 163 207  APTT -- -- -- --  LABPROT 23.7* -- 36.6* 34.6*  INR 2.23* -- 4.00* 3.71*  HEPARINUNFRC -- -- -- --  CREATININE 2.46* 2.00* -- 2.13*  CKTOTAL -- -- -- --  CKMB -- -- -- --  TROPONINI -- -- -- --    Estimated Creatinine Clearance: 40.2 ml/min (by C-G formula based on Cr of 2.46).  Assessment: 62yom continues on coumadin for afib. INR now back within therapeutic range after holding yesterday's dose. CBC improved. No bleeding reported.  Home dose = 10mg  daily but will given a higher dose tonight as INR dropped pretty significantly overnight.  Continues on amiodarone as pta.  Goal of Therapy:  INR 2-3 Monitor platelets by anticoagulation protocol: Yes   Plan:  1) Coumadin 12.5mg  x 1 tonight 2) Follow up INR in AM  Fredrik Rigger 01/30/2012,2:14 PM

## 2012-01-30 NOTE — Progress Notes (Signed)
CARDIAC REHAB PHASE I   PRE:  Rate/Rhythm: 105 afib    BP: sitting 16109    SaO2:   MODE:  Ambulation: 150 ft   POST:  Rate/Rhythm: 104 afib    BP: sitting 60454     SaO2:   Pt able to walk short distance. Sts it felt good and really wasn't that SOB. Sts he is more winded after he eats and feels very tired. To recliner. Very content after walk. Encouraged pt that we were all going to take good care of him (feeling frustrated and discouraged before walk). Will continue to follow. [id number]  Harriet Masson CES, ACSM

## 2012-01-30 NOTE — Progress Notes (Signed)
Subjective:  Denies any chest pain or shortness of breath. Had good diuresis with increased Lasix dose. Renal function worsened due to hypotension and hypovolemia.  Objective:  Vital Signs in the last 24 hours: Temp:  [98.1 F (36.7 C)-98.8 F (37.1 C)] 98.4 F (36.9 C) (01/28 0700) Pulse Rate:  [62-105] 105  (01/28 0754) Resp:  [11-25] 18  (01/28 0700) BP: (92-131)/(53-83) 92/53 mmHg (01/28 0754) SpO2:  [92 %-100 %] 97 % (01/28 0500) Weight:  [101.5 kg (223 lb 12.3 oz)] 101.5 kg (223 lb 12.3 oz) (01/28 0400)  Intake/Output from previous day: 01/27 0701 - 01/28 0700 In: 2366.4 [P.O.:1600; I.V.:758.4; IV Piggyback:8] Out: 4830 [Urine:4830] Intake/Output from this shift:    Physical Exam: Neck: no adenopathy, no carotid bruit, no JVD and supple, symmetrical, trachea midline Lungs: clear to auscultation bilaterally Heart: irregularly irregular rhythm, S1, S2 normal and Soft systolic murmur and S3 gallop noted Abdomen: soft, non-tender; bowel sounds normal; no masses,  no organomegaly Extremities: extremities normal, atraumatic, no cyanosis or edema  Lab Results:  Basename 01/30/12 0326 01/29/12 0500  WBC 5.8 4.8  HGB 13.8 10.0*  PLT 193 163    Basename 01/30/12 0326 01/29/12 0719  NA 133* 135  K 4.1 4.7  CL 93* 101  CO2 29 27  GLUCOSE 102* 95  BUN 33* 25*  CREATININE 2.46* 2.00*   No results found for this basename: TROPONINI:2,CK,MB:2 in the last 72 hours Hepatic Function Panel No results found for this basename: PROT,ALBUMIN,AST,ALT,ALKPHOS,BILITOT,BILIDIR,IBILI in the last 72 hours No results found for this basename: CHOL in the last 72 hours No results found for this basename: PROTIME in the last 72 hours  Imaging: Imaging results have been reviewed and No results found.  Cardiac Studies:  Assessment/Plan:  Resolving decompensated systolic heart failure  Status post right heart cath  Severe nonischemic dilated cardiomyopathy status post ICD  Hypertension    Hypercholesteremia  Acute on Chronic kidney disease stage IV multifactorial Chronic atrial fibrillation  History of nonsustained VT in  History of sarcoidosis  History of Cocaine abuse  Plan Continue present management as per heart failure team. Check labs in a.m.  LOS: 5 days    Kamori Kitchens N 01/30/2012, 9:38 AM

## 2012-01-31 DIAGNOSIS — N179 Acute kidney failure, unspecified: Secondary | ICD-10-CM

## 2012-01-31 LAB — CARBOXYHEMOGLOBIN: Total hemoglobin: 4.7 g/dL — CL (ref 13.5–18.0)

## 2012-01-31 LAB — CBC
MCH: 29.6 pg (ref 26.0–34.0)
MCHC: 34.5 g/dL (ref 30.0–36.0)
MCV: 85.7 fL (ref 78.0–100.0)
Platelets: 182 10*3/uL (ref 150–400)
RDW: 13.8 % (ref 11.5–15.5)
WBC: 6.9 10*3/uL (ref 4.0–10.5)

## 2012-01-31 LAB — BASIC METABOLIC PANEL
CO2: 27 mEq/L (ref 19–32)
Chloride: 96 mEq/L (ref 96–112)
Glucose, Bld: 108 mg/dL — ABNORMAL HIGH (ref 70–99)
Potassium: 3.9 mEq/L (ref 3.5–5.1)
Sodium: 133 mEq/L — ABNORMAL LOW (ref 135–145)

## 2012-01-31 MED ORDER — WARFARIN SODIUM 2.5 MG PO TABS
12.5000 mg | ORAL_TABLET | Freq: Once | ORAL | Status: AC
  Administered 2012-01-31: 12.5 mg via ORAL
  Filled 2012-01-31: qty 1

## 2012-01-31 NOTE — Progress Notes (Signed)
CARDIAC REHAB PHASE I   PRE:  Rate/Rhythm: 85 AFib  BP:  Supine: 106/74  Sitting:   Standing:    SaO2: 95RA  MODE:  Ambulation: 350 ft   POST:  Rate/Rhythem: 99 Afib  BP:  Supine: 117/84  Sitting:   Standing:    SaO2: 97RA 1348-1425 Pt ambulated well with assistance x1 and IV. Pt stated that he was feeling better today and denies SOB while walking. Educated on diet, sodium intake, and daily weights. Pt said he has book at home, but gave him to read during his stay. Will educate on exercise in the future. Return pt to bed with call light and phone in reach.   [name]

## 2012-01-31 NOTE — Progress Notes (Signed)
Subjective:  Complains of feeling tired. Denies any chest pain or shortness of breath activity Limited  Objective:  Vital Signs in the last 24 hours: Temp:  [97.7 F (36.5 C)-99.5 F (37.5 C)] 98.1 F (36.7 C) (01/29 1000) Pulse Rate:  [68-128] 97  (01/29 1000) Resp:  [10-29] 22  (01/29 1000) BP: (82-111)/(48-82) 104/73 mmHg (01/29 1000) SpO2:  [95 %-99 %] 96 % (01/29 0800) Weight:  [99.6 kg (219 lb 9.3 oz)] 99.6 kg (219 lb 9.3 oz) (01/29 0500)  Intake/Output from previous day: 01/28 0701 - 01/29 0700 In: 2130.6 [P.O.:1380; I.V.:750.6] Out: 1400 [Urine:1400] Intake/Output from this shift: Total I/O In: 83.1 [I.V.:83.1] Out: -   Physical Exam: Neck: no adenopathy, no carotid bruit, no JVD and supple, symmetrical, trachea midline Lungs: clear to auscultation bilaterally Heart: irregularly irregular rhythm, S1, S2 normal and Soft systolic murmur and S3 gallop noted Abdomen: soft, non-tender; bowel sounds normal; no masses,  no organomegaly Extremities: extremities normal, atraumatic, no cyanosis or edema  Lab Results:  Basename 01/31/12 0430 01/30/12 0326  WBC 6.9 5.8  HGB 13.7 13.8  PLT 182 193    Basename 01/31/12 0430 01/30/12 0326  NA 133* 133*  K 3.9 4.1  CL 96 93*  CO2 27 29  GLUCOSE 108* 102*  BUN 33* 33*  CREATININE 2.16* 2.46*   No results found for this basename: TROPONINI:2,CK,MB:2 in the last 72 hours Hepatic Function Panel No results found for this basename: PROT,ALBUMIN,AST,ALT,ALKPHOS,BILITOT,BILIDIR,IBILI in the last 72 hours No results found for this basename: CHOL in the last 72 hours No results found for this basename: PROTIME in the last 72 hours  Imaging: Imaging results have been reviewed and No results found.  Cardiac Studies:  Assessment/Plan:  Resolving decompensated systolic heart failure  Status post right heart cath  Severe nonischemic dilated cardiomyopathy status post ICD  Hypertension  Hypercholesteremia  Acute on Chronic  kidney disease stage IV multifactorial  Chronic atrial fibrillation  History of nonsustained VT in  History of sarcoidosis  History of Cocaine abuse  Plan Continue present management Increase ambulation Agree with discontine swan and weaning off milrinone. Consider restarting Lasix  LOS: 6 days    [name] N 01/31/2012, 10:16 AM

## 2012-01-31 NOTE — Progress Notes (Signed)
Assumed care at 1000, pt resting in bed. C/o pain from tape at swan insertion site. Amy Georgie Chard, NP updated on pt status. Order recevied to d/c swan and leave cordus in place

## 2012-01-31 NOTE — Progress Notes (Signed)
ANTICOAGULATION CONSULT NOTE - Follow Up Consult  Pharmacy Consult for Coumadin Indication: atrial fibrillation  Allergies  Allergen Reactions  . Ativan (Lorazepam) Other (See Comments)    Didn't like the way it made him feel; hallucinations, talking to himself   Patient Measurements: Height: 6\' 3"  (190.5 cm) Weight: 219 lb 9.3 oz (99.6 kg) IBW/kg (Calculated) : 84.5   Vital Signs: Temp: 98.6 F (37 C) (01/29 0700) Temp src: Core (Comment) (01/29 0400) BP: 110/75 mmHg (01/29 0700) Pulse Rate: 69  (01/29 0700)  Labs:  Basename 01/31/12 0430 01/30/12 0326 01/29/12 0719 01/29/12 0500  HGB 13.7 13.8 -- --  HCT 39.7 40.2 -- 30.4*  PLT 182 193 -- 163  APTT -- -- -- --  LABPROT 22.0* 23.7* -- 36.6*  INR 2.01* 2.23* -- 4.00*  HEPARINUNFRC -- -- -- --  CREATININE 2.16* 2.46* 2.00* --  CKTOTAL -- -- -- --  CKMB -- -- -- --  TROPONINI -- -- -- --    Estimated Creatinine Clearance: 42.4 ml/min (by C-G formula based on Cr of 2.16).  Assessment: 62yom continues on coumadin for afib. INR remains therapeutic. CBC stable. No bleeding reported. Continues on amiodarone as pta. Will give another higher dose tonight to try and prevent INR from becoming subtherapeutic. Can maybe resume home dose tomorrow.  Goal of Therapy:  INR 2-3 Monitor platelets by anticoagulation protocol: Yes   Plan:  1) Coumadin 12.5mg  x 1 tonight 2) Follow up INR in AM  Fredrik Rigger 01/31/2012,8:04 AM

## 2012-01-31 NOTE — Progress Notes (Addendum)
Advanced Heart Failure Rounding Note   Subjective:    [name] is a 62 y.o. gentlemen with multiple hospital admissions for HF exacerbations. 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 He also has history of ventricular tachycardia s/p St Jude ICD by [name], chronic kidney disease: stage III, baseline Cr ~2, and paroxysmal atrial arrhythmias controlled by amiodarone. As well as sarcoidosis, hypertension. Cath in 2008 by Dr. [name] showed no CAD. Dry weight is 219 (11/2011).   Primary cardiologist [name]. Admitted with NYHA IIIB/IV HF with low out put symptoms. RHC performed with low output. Milrinone post cath. Over the weekend BIDIL titrated up and he was switched to po lasix.  Yesterday received 80 mg IV lasix every 8 hours and Metolazone. Hydralazine increased to 75 mg tid. Overall weight down 9 pounds. 24 hour I/O -2.3.   RHC 1/2 4/14 RA = 14  RV = 51/16/15  PA = 57/34 (43)  PCW = 26  Fick cardiac output/index = 4.2/1.8  Thermo CO/CI = 3.9/1.7  PVR = 4.0 Woods  O2 sat = 97%  PA sat = 57%, 58%  Ao Pressure (non-invasive): 116/80 (93)  SVR = 1504   Yesterday lasix stopped due to volume depletion and worsening renal function. Remains on milrinone. Feels much better. No dyspnea, orthopnea or PND.  CO-OX 44>54>56>67  Weight down 4 pounds from yesterday despite lasix being held and even I/Os (?). 13 pounds down total. BMET pending.   Swan numbers checked personally this am   CVP 5 PA 39/19 PCWP 15   Objective:   Weight Range:  Vital Signs:   Temp:  [97.7 F (36.5 C)-99.5 F (37.5 C)] 98.1 F (36.7 C) (01/29 0500) Pulse Rate:  [68-136] 98  (01/29 0500) Resp:  [10-29] 20  (01/29 0500) BP: (82-111)/(48-81) 94/73 mmHg (01/29 0500) SpO2:  [95 %-99 %] 97 % (01/29 0400) Weight:  [99.6 kg (219 lb 9.3 oz)] 99.6 kg (219 lb 9.3 oz) (01/29 0500) Last BM Date:  01/29/12  Weight change: Filed Weights   01/29/12 0500 01/30/12 0400 01/31/12 0500  Weight: 102.9 kg (226 lb 13.7 oz) 101.5 kg (223 lb 12.3 oz) 99.6 kg (219 lb 9.3 oz)    Intake/Output:   Intake/Output Summary (Last 24 hours) at 01/31/12 0534 Last data filed at 01/31/12 0500  Gross per 24 hour  Intake 2138.4 ml  Output   1700 ml  Net  438.4 ml     Physical Exam:  General: Well appearing. No resp difficulty  HEENT: normal  Neck: supple. JVP 5-6. Carotids 2+ bilat; no bruits. No lymphadenopathy or thryomegaly appreciated. R IJ swan Cor: PMI nondisplaced. Regular rate & rhythm. 3/64m MR. +S3  Lungs: clear 2 liters  Abdomen: soft, nontender, obese, + distention. Palpable liver edge. No bruits or masses. Good bowel sounds.  Extremities: no cyanosis, clubbing, rash, trace lower extremity edema  Neuro: alert & orientedx3, cranial nerves grossly intact. moves all 4 extremities w/o difficulty. Affect pleasant     Telemetry: A Fib 98  Labs: Basic Metabolic Panel:  Lab 01/30/12 4098 01/29/12 0719 01/28/12 0445 01/27/12 0425 01/26/12 0200 01/25/12 2005  NA 133* 135 136 140 142 --  K 4.1 4.7 4.4 3.4* 4.2 --  CL 93* 101 102 104 104 --  CO2 29 27 28 27 22  --  GLUCOSE 102* 95 97 91 85 --  BUN 33* 25* 28* 35* 39* --  CREATININE  2.46* 2.00* 2.13* 2.26* 2.04* --  CALCIUM 10.1 9.4 9.5 -- -- --  MG -- -- 2.0 -- -- 1.9  PHOS -- -- -- -- -- --    Liver Function Tests:  Lab 01/25/12 2005 01/25/12 1401  AST 108* 100*  ALT 68* 63*  ALKPHOS 79 79  BILITOT 1.3* 1.0  PROT 8.0 8.1  ALBUMIN 3.4* 3.2*   No results found for this basename: LIPASE:5,AMYLASE:5 in the last 168 hours No results found for this basename: AMMONIA:3 in the last 168 hours  CBC:  Lab 01/31/12 0430 01/30/12 0326 01/29/12 0500 01/28/12 0445 01/27/12 0425 01/25/12 2005  WBC 6.9 5.8 4.8 5.7 6.5 --  NEUTROABS -- -- -- 1.9 -- 2.2  HGB 13.7 13.8 10.0* 11.8* 11.7* --  HCT 39.7 40.2 30.4* 35.9* 34.8* --  MCV 85.7 85.7  87.6 88.6 88.1 --  PLT 182 193 163 207 224 --    Cardiac Enzymes:  Lab 01/26/12 0145 01/25/12 2006 01/25/12 1401  CKTOTAL -- -- --  CKMB -- -- --  CKMBINDEX -- -- --  TROPONINI <0.30 <0.30 <0.30    BNP: BNP (last 3 results)  Basename 01/30/12 0326 01/27/12 0425 01/25/12 2006  PROBNP 2314.0* 2775.0* 5263.0*     Other results:     Imaging: No results found.   Medications:     Scheduled Medications:    . allopurinol  100 mg Oral Daily  . amiodarone  200 mg Oral Daily  . atorvastatin  20 mg Oral q1800  . carvedilol  6.25 mg Oral BID WC  . hydrALAZINE  75 mg Oral TID  . isosorbide mononitrate  30 mg Oral Daily  . pantoprazole  40 mg Oral Daily  . polyethylene glycol  17 g Oral BID  . sodium chloride  3 mL Intravenous Q12H  . Warfarin - Pharmacist Dosing Inpatient   Does not apply q1800    Infusions:    . sodium chloride 20 mL/hr at 01/30/12 1209  . milrinone 0.375 mcg/kg/min (01/31/12 0420)    PRN Medications: sodium chloride, acetaminophen, ALPRAZolam, HYDROmorphone (DILAUDID) injection, ondansetron (ZOFRAN) IV, oxyCODONE-acetaminophen, sodium chloride   Assessment:  1. A/C systolic heart failure EF 20%  --with multiple readmissions  2. NICM  3. Chronic renal failure  4. H/o VT s/p ICD  5. 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 Chronic AF 7. NSVT 8. Gout    Plan/Discussion:    Much improved. Challenege will be to keep him this way off inotropes. Begin milrinone wean today (need to go slow). Continue to hold diuretics until tomorrow. Can d/c Swan but keep sheath in. Ambulate with cardiac rehab. Await BMET   No spironolactone/Ace due to renal failure.   Given lack of improvement in renal function with inotropic support, advanced options will be somewhat limited. Will need to focus closely on volume control. Home inotropes may be short-term option.  Rate controlled. Coumadin per pharmacy.  Can go to stepdown (2900 only) once [place]  out. Continue to follow CVPs and co-ox through sheath.   Length of Stay: 6  Noni Stonesifer,MD 5:34 AM

## 2012-01-31 NOTE — Progress Notes (Signed)
Critical value of hemoglobin 4.7 noted from carboxyhemoglobin. Hemoglobin from CBC was 13.7. Will inform day nurse.

## 2012-02-01 ENCOUNTER — Encounter: Payer: PRIVATE HEALTH INSURANCE | Admitting: Cardiology

## 2012-02-01 LAB — BASIC METABOLIC PANEL
BUN: 29 mg/dL — ABNORMAL HIGH (ref 6–23)
GFR calc Af Amer: 39 mL/min — ABNORMAL LOW (ref >90)
GFR calc non Af Amer: 33 mL/min — ABNORMAL LOW (ref >90)
Potassium: 4 mEq/L (ref 3.5–5.1)

## 2012-02-01 LAB — CBC
HCT: 40.3 % (ref 39.0–52.0)
Hemoglobin: 13.3 g/dL (ref 13.0–17.0)
MCH: 28.2 pg (ref 26.0–34.0)
MCV: 85.6 fL (ref 78.0–100.0)
RBC: 4.71 MIL/uL (ref 4.22–5.81)
WBC: 5.5 10*3/uL (ref 4.0–10.5)

## 2012-02-01 LAB — CARBOXYHEMOGLOBIN
Carboxyhemoglobin: 1.5 % (ref 0.5–1.5)
Carboxyhemoglobin: 1.8 % — ABNORMAL HIGH (ref 0.5–1.5)
Methemoglobin: 1.1 % (ref 0.0–1.5)
O2 Saturation: 47.1 %

## 2012-02-01 MED ORDER — ZOLPIDEM TARTRATE 5 MG PO TABS
5.0000 mg | ORAL_TABLET | Freq: Every evening | ORAL | Status: DC | PRN
  Administered 2012-02-01 – 2012-02-04 (×4): 5 mg via ORAL
  Filled 2012-02-01 (×4): qty 1

## 2012-02-01 MED ORDER — WARFARIN SODIUM 10 MG PO TABS
10.0000 mg | ORAL_TABLET | Freq: Once | ORAL | Status: AC
  Administered 2012-02-01: 10 mg via ORAL
  Filled 2012-02-01: qty 1

## 2012-02-01 NOTE — Progress Notes (Signed)
. Advanced Heart Failure Rounding Note   Subjective:    [name] is a 62 y.o. gentlemen with multiple hospital admissions for HF exacerbations. 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 He also has history of ventricular tachycardia s/p St Jude ICD by [name], chronic kidney disease: stage III, baseline Cr ~2, and paroxysmal atrial arrhythmias controlled by amiodarone. As well as sarcoidosis, hypertension. Cath in 2008 by Dr. [name] showed no CAD. Dry weight is 219 (11/2011).   Primary cardiologist [name]. Admitted with NYHA IIIB/IV HF with low out put symptoms. RHC performed with low output. Milrinone post cath. Over the weekend BIDIL titrated up and he was switched to po lasix.  Yesterday received 80 mg IV lasix every 8 hours and Metolazone. Hydralazine increased to 75 mg tid. Overall weight down 9 pounds. 24 hour I/O -2.3.   RHC 1/2 4/14 RA = 14  RV = 51/16/15  PA = 57/34 (43)  PCW = 26  Fick cardiac output/index = 4.2/1.8  Thermo CO/CI = 3.9/1.7  PVR = 4.0 Woods  O2 sat = 97%  PA sat = 57%, 58%  Ao Pressure (non-invasive): 116/80 (93)  SVR = 1504   01/30/12 lasix stopped due to volume depletion and worsening renal function. Yesterday Milrinone weaned to 0.25 mcg. Ambulated  350 feet without dyspnea. Overall weight down 12 pounds since admit.   CO-OX 44>54>56>67>47> repeat  Creatinine 2.16>2.03   CVP 5  Complaining of dyspnea. Denies PND/Orthopnea     Objective:   Weight Range:  Vital Signs:   Temp:  [97.7 F (36.5 C)-98.6 F (37 C)] 97.7 F (36.5 C) (01/30 0358) Pulse Rate:  [69-171] 86  (01/29 1638) Resp:  [15-28] 18  (01/30 0500) BP: (93-117)/(65-87) 111/85 mmHg (01/30 0344) SpO2:  [91 %-97 %] 97 % (01/30 0500) Weight:  [220 lb 0.3 oz (99.8 kg)] 220 lb 0.3 oz (99.8 kg) (01/30 0500) Last BM Date: 01/31/12  Weight change: Filed Weights    01/30/12 0400 01/31/12 0500 02/01/12 0500  Weight: 223 lb 12.3 oz (101.5 kg) 219 lb 9.3 oz (99.6 kg) 220 lb 0.3 oz (99.8 kg)    Intake/Output:   Intake/Output Summary (Last 24 hours) at 02/01/12 0628 Last data filed at 02/01/12 0400  Gross per 24 hour  Intake 1744.4 ml  Output    300 ml  Net 1444.4 ml     Physical Exam: CVP 5-6 General: Well appearing. No resp difficulty  HEENT: normal  Neck: supple. JVP 5-6. Carotids 2+ bilat; no bruits. No lymphadenopathy or thryomegaly appreciated. R IJ swan sleeve Cor: PMI nondisplaced. Regular rate & rhythm. 3/68m MR. +S3  Lungs: clear 2 liters  Abdomen: soft, nontender, obese, + distention. Palpable liver edge. No bruits or masses. Good bowel sounds.  Extremities: no cyanosis, clubbing, rash, no lower extremity edema  Neuro: alert & orientedx3, cranial nerves grossly intact. moves all 4 extremities w/o difficulty. Affect pleasant     Telemetry: A Fib 98  Labs: Basic Metabolic Panel:  Lab 02/01/12 4098 01/31/12 0430 01/30/12 0326 01/29/12 0719 01/28/12 0445 01/25/12 2005  NA 134* 133* 133* 135 136 --  K 4.0 3.9 4.1 4.7 4.4 --  CL 98 96 93* 101 102 --  CO2 26 27 29 27 28  --  GLUCOSE 101* 108* 102* 95 97 --  BUN 29* 33* 33* 25* 28* --  CREATININE 2.03* 2.16* 2.46* 2.00* 2.13* --  CALCIUM 10.1  10.0 10.1 -- -- --  MG -- -- -- -- 2.0 1.9  PHOS -- -- -- -- -- --    Liver Function Tests:  Lab 01/25/12 2005 01/25/12 1401  AST 108* 100*  ALT 68* 63*  ALKPHOS 79 79  BILITOT 1.3* 1.0  PROT 8.0 8.1  ALBUMIN 3.4* 3.2*   No results found for this basename: LIPASE:5,AMYLASE:5 in the last 168 hours No results found for this basename: AMMONIA:3 in the last 168 hours  CBC:  Lab 02/01/12 0355 01/31/12 0430 01/30/12 0326 01/29/12 0500 01/28/12 0445 01/25/12 2005  WBC 5.5 6.9 5.8 4.8 5.7 --  NEUTROABS -- -- -- -- 1.9 2.2  HGB 13.3 13.7 13.8 10.0* 11.8* --  HCT 40.3 39.7 40.2 30.4* 35.9* --  MCV 85.6 85.7 85.7 87.6 88.6 --  PLT 197 182  193 163 207 --    Cardiac Enzymes:  Lab 01/26/12 0145 01/25/12 2006 01/25/12 1401  CKTOTAL -- -- --  CKMB -- -- --  CKMBINDEX -- -- --  TROPONINI <0.30 <0.30 <0.30    BNP: BNP (last 3 results)  Basename 01/30/12 0326 01/27/12 0425 01/25/12 2006  PROBNP 2314.0* 2775.0* 5263.0*     Other results:     Imaging: No results found.   Medications:     Scheduled Medications:    . allopurinol  100 mg Oral Daily  . amiodarone  200 mg Oral Daily  . atorvastatin  20 mg Oral q1800  . carvedilol  6.25 mg Oral BID WC  . hydrALAZINE  75 mg Oral TID  . isosorbide mononitrate  30 mg Oral Daily  . pantoprazole  40 mg Oral Daily  . polyethylene glycol  17 g Oral BID  . Warfarin - Pharmacist Dosing Inpatient   Does not apply q1800    Infusions:    . sodium chloride 20 mL/hr at 01/31/12 0800  . milrinone 0.25 mcg/kg/min (01/31/12 1840)    PRN Medications: acetaminophen, ALPRAZolam, HYDROmorphone (DILAUDID) injection, oxyCODONE-acetaminophen   Assessment:  1. A/C systolic heart failure EF 20%  --with multiple readmissions  2. NICM  3. Chronic renal failure  4. H/o VT s/p ICD  5. 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 Chronic AF 7. NSVT 8. Gout    Plan/Discussion:    Increased dyspnea this am. Repeat CO-OX now. IF CO-OX  < 50 will need to increase Milrinone back to 0.375 mcg. If Milrinone increased will need to place PICC for home inotropes. CVP 5-6 hold off on diuretics.      No spironolactone/Ace due to renal failure.   Given lack of improvement in renal function with inotropic support, advanced options will be somewhat limited. Will need to focus closely on volume control. Home inotropes may be short-term option.  Rate controlled. Coumadin per pharmacy.   Length of Stay: 7  CLEGG,AMY,NP-C 6:28 AM  Patient seen and examined with Tonye Becket, NP. We discussed all aspects of the encounter. I agree with the assessment and plan as stated above.    Overnight had episode of orthopnea but now better. CVP OK. Co-ox widely variable. Will cut milrinone to 0.125 and see how he does with lower dose. I suspect he will need home inotropes.    Daniel Bensimhon,MD 3:11 PM

## 2012-02-01 NOTE — Progress Notes (Signed)
Subjective:  Patient denies any chest pain states had episode of shortness of breath last night but feels okay now O2 sats 90s on room air. Remains on low-dose inotropes and being evaluated for home inotropes.  Objective:  Vital Signs in the last 24 hours: Temp:  [97.7 F (36.5 C)-98.6 F (37 C)] 98.1 F (36.7 C) (01/30 0822) Pulse Rate:  [78-171] 86  (01/30 0935) Resp:  [16-28] 18  (01/30 0500) BP: (93-119)/(65-87) 107/86 mmHg (01/30 0935) SpO2:  [91 %-97 %] 91 % (01/30 0935) Weight:  [99.8 kg (220 lb 0.3 oz)] 99.8 kg (220 lb 0.3 oz) (01/30 0500)  Intake/Output from previous day: 01/29 0701 - 01/30 0700 In: 1956.7 [P.O.:1375; I.V.:581.7] Out: 500 [Urine:500] Intake/Output from this shift: Total I/O In: 180 [P.O.:180] Out: -   Physical Exam: Neck: no adenopathy, no carotid bruit, no JVD and supple, symmetrical, trachea midline Lungs: clear to auscultation bilaterally Heart: irregularly irregular rhythm, S1, S2 normal and Soft systolic murmur noted Abdomen: soft, non-tender; bowel sounds normal; no masses,  no organomegaly Extremities: extremities normal, atraumatic, no cyanosis or edema  Lab Results:  Basename 02/01/12 0355 01/31/12 0430  WBC 5.5 6.9  HGB 13.3 13.7  PLT 197 182    Basename 02/01/12 0355 01/31/12 0430  NA 134* 133*  K 4.0 3.9  CL 98 96  CO2 26 27  GLUCOSE 101* 108*  BUN 29* 33*  CREATININE 2.03* 2.16*   No results found for this basename: TROPONINI:2,CK,MB:2 in the last 72 hours Hepatic Function Panel No results found for this basename: PROT,ALBUMIN,AST,ALT,ALKPHOS,BILITOT,BILIDIR,IBILI in the last 72 hours No results found for this basename: CHOL in the last 72 hours No results found for this basename: PROTIME in the last 72 hours  Imaging: Imaging results have been reviewed and No results found.  Cardiac Studies:  Assessment/Plan:  Resolving decompensated systolic heart failure  Status post right heart cath  Severe nonischemic dilated  cardiomyopathy status post ICD  Hypertension  Hypercholesteremia  Acute on Chronic kidney disease stage IV multifactorial  Chronic atrial fibrillation  History of nonsustained VT in  History of sarcoidosis  History of Cocaine abuse  Plan Continue present management as per advanced heart failure team.  LOS: 7 days    Kellyn Mccary N 02/01/2012, 10:28 AM

## 2012-02-01 NOTE — Progress Notes (Signed)
ANTICOAGULATION CONSULT NOTE - Follow Up Consult  Pharmacy Consult for Coumadin Indication: atrial fibrillation  Allergies  Allergen Reactions  . Ativan (Lorazepam) Other (See Comments)    Didn't like the way it made him feel; hallucinations, talking to himself    Patient Measurements: Height: 6\' 3"  (190.5 cm) Weight: 220 lb 0.3 oz (99.8 kg) IBW/kg (Calculated) : 84.5   Vital Signs: Temp: 98.1 F (36.7 C) (01/30 0822) Temp src: Oral (01/30 0822) BP: 109/62 mmHg (01/30 1039) Pulse Rate: 82  (01/30 1039)  Labs:  Basename 02/01/12 0355 01/31/12 0430 01/30/12 0326  HGB 13.3 13.7 --  HCT 40.3 39.7 40.2  PLT 197 182 193  APTT -- -- --  LABPROT 25.5* 22.0* 23.7*  INR 2.46* 2.01* 2.23*  HEPARINUNFRC -- -- --  CREATININE 2.03* 2.16* 2.46*  CKTOTAL -- -- --  CKMB -- -- --  TROPONINI -- -- --    Estimated Creatinine Clearance: 45.1 ml/min (by C-G formula based on Cr of 2.03).  Assessment: 62yom continues on coumadin for afib. INR remains therapeutic. CBC stable. No bleeding reported. Continues on amiodarone as pta. Will try to re-initiate home dose today.  Goal of Therapy:  INR 2-3 Monitor platelets by anticoagulation protocol: Yes   Plan:  1) Coumadin 10mg  x 1 tonight 2) Follow up INR in AM  Fredrik Rigger 02/01/2012,12:08 PM

## 2012-02-01 NOTE — Progress Notes (Signed)
CARDIAC REHAB PHASE I   PRE:  Rate/Rhythm: 91 afib    BP: sitting 119/84    SaO2: 97 2L, 97 RA  MODE:  Ambulation: 520 ft   POST:  Rate/Rhythm: 104 afib with PVC    BP: sitting 107/86     SaO2: 95-97 RA  Tolerated well. No O2 needed. Increased distance today. Somewhat exerted after walk but feels good. Glad to walk. Sts he is going to walk more today and sit in recliner.  [id number] Harriet Masson CES, ACSM

## 2012-02-02 ENCOUNTER — Encounter: Payer: Self-pay | Admitting: Internal Medicine

## 2012-02-02 LAB — CBC
HCT: 38.1 % — ABNORMAL LOW (ref 39.0–52.0)
Hemoglobin: 12.9 g/dL — ABNORMAL LOW (ref 13.0–17.0)
MCH: 29.1 pg (ref 26.0–34.0)
MCHC: 33.9 g/dL (ref 30.0–36.0)

## 2012-02-02 LAB — BASIC METABOLIC PANEL
Chloride: 99 mEq/L (ref 96–112)
GFR calc Af Amer: 42 mL/min — ABNORMAL LOW (ref >90)
Potassium: 4.1 mEq/L (ref 3.5–5.1)

## 2012-02-02 LAB — CARBOXYHEMOGLOBIN
Carboxyhemoglobin: 1.2 % (ref 0.5–1.5)
Carboxyhemoglobin: 1.3 % (ref 0.5–1.5)
Methemoglobin: 0.8 % (ref 0.0–1.5)
Methemoglobin: 1 % (ref 0.0–1.5)
O2 Saturation: 46.6 %
Total hemoglobin: 11.1 g/dL — ABNORMAL LOW (ref 13.5–18.0)

## 2012-02-02 LAB — PROTIME-INR: Prothrombin Time: 33 seconds — ABNORMAL HIGH (ref 11.6–15.2)

## 2012-02-02 NOTE — Progress Notes (Signed)
Subjective:  Patient denies any chest pain or shortness of breath states his breathing has improved. Patient is off inotropes now. Tentatively plan for discharge for tomorrow  Objective:  Vital Signs in the last 24 hours: Temp:  [96.5 F (35.8 C)-98.3 F (36.8 C)] 97.5 F (36.4 C) (01/31 0700) Pulse Rate:  [70-90] 70  (01/31 1205) Resp:  [16-18] 18  (01/31 1205) BP: (88-123)/(70-102) 95/72 mmHg (01/31 1205) SpO2:  [94 %-98 %] 96 % (01/31 1205) Weight:  [100 kg (220 lb 7.4 oz)] 100 kg (220 lb 7.4 oz) (01/31 0500)  Intake/Output from previous day: 01/30 0701 - 01/31 0700 In: 1168 [P.O.:500; I.V.:668] Out: 1650 [Urine:1650] Intake/Output from this shift: Total I/O In: 180 [P.O.:150; I.V.:30] Out: 500 [Urine:500]  Physical Exam: Neck: no adenopathy, no carotid bruit, no JVD and supple, symmetrical, trachea midline Lungs: clear to auscultation bilaterally Heart: irregularly irregular rhythm, S1, S2 normal and Soft systolic murmur and soft S3 gallop noted Abdomen: soft, non-tender; bowel sounds normal; no masses,  no organomegaly Extremities: extremities normal, atraumatic, no cyanosis or edema  Lab Results:  Basename 02/02/12 0430 02/01/12 0355  WBC 6.0 5.5  HGB 12.9* 13.3  PLT 202 197    Basename 02/02/12 0430 02/01/12 0355  NA 132* 134*  K 4.1 4.0  CL 99 98  CO2 24 26  GLUCOSE 82 101*  BUN 30* 29*  CREATININE 1.91* 2.03*   No results found for this basename: TROPONINI:2,CK,MB:2 in the last 72 hours Hepatic Function Panel No results found for this basename: PROT,ALBUMIN,AST,ALT,ALKPHOS,BILITOT,BILIDIR,IBILI in the last 72 hours No results found for this basename: CHOL in the last 72 hours No results found for this basename: PROTIME in the last 72 hours  Imaging: Imaging results have been reviewed and No results found.  Cardiac Studies:  Assessment/Plan:  Resolving decompensated systolic heart failure  Status post right heart cath  Severe nonischemic dilated  cardiomyopathy status post ICD  Hypertension  Hypercholesteremia  Acute on Chronic kidney disease stage IV multifactorial stable. Chronic atrial fibrillation  History of nonsustained VT in the past History of sarcoidosis  History of Cocaine abuse  Plan Continue present management Possible home tomorrow Dr. Algie Coffer on-call for weekend Followup with me in one week   LOS: 8 days    Leilyn Frayre N 02/02/2012, 12:17 PM

## 2012-02-02 NOTE — Progress Notes (Signed)
ANTICOAGULATION CONSULT NOTE - Follow Up Consult  Pharmacy Consult for Coumadin Indication: atrial fibrillation  Allergies  Allergen Reactions  . Ativan (Lorazepam) Other (See Comments)    Didn't like the way it made him feel; hallucinations, talking to himself   Patient Measurements: Height: 6\' 3"  (190.5 cm) Weight: 220 lb 7.4 oz (100 kg) IBW/kg (Calculated) : 84.5   Vital Signs: Temp: 97.5 F (36.4 C) (01/31 0700) Temp src: Axillary (01/31 0700) BP: 116/93 mmHg (01/31 0700) Pulse Rate: 89  (01/31 0700)  Labs:  Basename 02/02/12 0430 02/01/12 0355 01/31/12 0430  HGB 12.9* 13.3 --  HCT 38.1* 40.3 39.7  PLT 202 197 182  APTT -- -- --  LABPROT 33.0* 25.5* 22.0*  INR 3.48* 2.46* 2.01*  HEPARINUNFRC -- -- --  CREATININE 1.91* 2.03* 2.16*  CKTOTAL -- -- --  CKMB -- -- --  TROPONINI -- -- --    Estimated Creatinine Clearance: 47.9 ml/min (by C-G formula based on Cr of 1.91).  Assessment: 62yom continues on coumadin for afib. INR has jumped overnight and is now supratherapeutic. Probably due to the 2 higher doses given 1/28 and 1/29. HF improving. CBC stable. No bleeding reported. Continues on amiodarone as pta.   Goal of Therapy:  INR 2-3 Monitor platelets by anticoagulation protocol: Yes   Plan:  1) No coumadin tonight 2) Follow up INR in AM  Fredrik Rigger 02/02/2012,10:45 AM

## 2012-02-02 NOTE — Progress Notes (Addendum)
. Advanced Heart Failure Rounding Note   Subjective:    [name] is a 62 y.o. gentlemen with multiple hospital admissions for HF exacerbations. 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 He also has history of ventricular tachycardia s/p St Jude ICD by [name], chronic kidney disease: stage III, baseline Cr ~2, and paroxysmal atrial arrhythmias controlled by amiodarone. As well as sarcoidosis, hypertension. Cath in 2008 by Dr. [name] showed no CAD. Dry weight is 219 (11/2011).   Primary cardiologist [name]. Admitted with NYHA IIIB/IV HF with low out put symptoms. RHC performed with low output. Milrinone post cath. Over the weekend BIDIL titrated up and he was switched to po lasix.  Yesterday received 80 mg IV lasix every 8 hours and Metolazone. Hydralazine increased to 75 mg tid. Overall weight down 9 pounds. 24 hour I/O -2.3.   RHC 1/2 4/14 RA = 14  RV = 51/16/15  PA = 57/34 (43)  PCW = 26  Fick cardiac output/index = 4.2/1.8  Thermo CO/CI = 3.9/1.7  PVR = 4.0 Woods  O2 sat = 97%  PA sat = 57%, 58%  Ao Pressure (non-invasive): 116/80 (93)  SVR = 1504   01/30/12 lasix stopped due to volume depletion and worsening renal function. Yesterday Milrinone weaned to 0.125 mcg. Ambulated  500 without dyspnea. Overall weight down 12 pounds since admit.   CO-OX 44>54>56>67>47> 81>62 Creatinine 2.16>2.03>1.91   CVP 9-10   Denies SOB/PND/Orthopnea     Objective:   Weight Range:  Vital Signs:   Temp:  [96.5 F (35.8 C)-98.3 F (36.8 C)] 97.5 F (36.4 C) (01/31 0700) Pulse Rate:  [82-90] 89  (01/31 0700) Resp:  [16] 16  (01/31 0700) BP: (88-123)/(62-102) 116/93 mmHg (01/31 0700) SpO2:  [91 %-98 %] 94 % (01/31 0700) Weight:  [220 lb 7.4 oz (100 kg)] 220 lb 7.4 oz (100 kg) (01/31 0500) Last BM Date: 01/31/12  Weight change: Filed Weights   01/31/12 0500 02/01/12  0500 02/02/12 0500  Weight: 219 lb 9.3 oz (99.6 kg) 220 lb 0.3 oz (99.8 kg) 220 lb 7.4 oz (100 kg)    Intake/Output:   Intake/Output Summary (Last 24 hours) at 02/02/12 0745 Last data filed at 02/02/12 0639  Gross per 24 hour  Intake 1152.41 ml  Output   1650 ml  Net -497.59 ml     Physical Exam: CVP 9-10 General: Well appearing. No resp difficulty  HEENT: normal  Neck: supple. JVP 9-10. Carotids 2+ bilat; no bruits. No lymphadenopathy or thryomegaly appreciated. R IJ swan sleeve Cor: PMI nondisplaced. Regular rate & rhythm. 3/73m MR. +S3  Lungs: clear 2 liters  Abdomen: soft, nontender, obese, + distention. Palpable liver edge. No bruits or masses. Good bowel sounds.  Extremities: no cyanosis, clubbing, rash, no lower extremity edema  Neuro: alert & orientedx3, cranial nerves grossly intact. moves all 4 extremities w/o difficulty. Affect pleasant     Telemetry: A Fib 98  Labs: Basic Metabolic Panel:  Lab 02/02/12 1610 02/01/12 0355 01/31/12 0430 01/30/12 0326 01/29/12 0719 01/28/12 0445  NA 132* 134* 133* 133* 135 --  K 4.1 4.0 3.9 4.1 4.7 --  CL 99 98 96 93* 101 --  CO2 24 26 27 29 27  --  GLUCOSE 82 101* 108* 102* 95 --  BUN 30* 29* 33* 33* 25* --  CREATININE 1.91* 2.03* 2.16* 2.46* 2.00* --  CALCIUM 9.8 10.1 10.0 -- -- --  MG -- -- -- -- -- 2.0  PHOS -- -- -- -- -- --    Liver Function Tests: No results found for this basename: AST:5,ALT:5,ALKPHOS:5,BILITOT:5,PROT:5,ALBUMIN:5 in the last 168 hours No results found for this basename: LIPASE:5,AMYLASE:5 in the last 168 hours No results found for this basename: AMMONIA:3 in the last 168 hours  CBC:  Lab 02/02/12 0430 02/01/12 0355 01/31/12 0430 01/30/12 0326 01/29/12 0500 01/28/12 0445  WBC 6.0 5.5 6.9 5.8 4.8 --  NEUTROABS -- -- -- -- -- 1.9  HGB 12.9* 13.3 13.7 13.8 10.0* --  HCT 38.1* 40.3 39.7 40.2 30.4* --  MCV 85.8 85.6 85.7 85.7 87.6 --  PLT 202 197 182 193 163 --    Cardiac Enzymes: No results found  for this basename: CKTOTAL:5,CKMB:5,CKMBINDEX:5,TROPONINI:5 in the last 168 hours  BNP: BNP (last 3 results)  Basename 01/30/12 0326 01/27/12 0425 01/25/12 2006  PROBNP 2314.0* 2775.0* 5263.0*     Other results:     Imaging: No results found.   Medications:     Scheduled Medications:    . allopurinol  100 mg Oral Daily  . amiodarone  200 mg Oral Daily  . atorvastatin  20 mg Oral q1800  . carvedilol  6.25 mg Oral BID WC  . hydrALAZINE  75 mg Oral TID  . isosorbide mononitrate  30 mg Oral Daily  . pantoprazole  40 mg Oral Daily  . polyethylene glycol  17 g Oral BID  . Warfarin - Pharmacist Dosing Inpatient   Does not apply q1800    Infusions:    . sodium chloride 20 mL/hr at 02/01/12 1726    PRN Medications: acetaminophen, ALPRAZolam, HYDROmorphone (DILAUDID) injection, oxyCODONE-acetaminophen, zolpidem   Assessment:  1. A/C systolic heart failure EF 20%  --with multiple readmissions  2. NICM  3. Chronic renal failure  4. H/o VT s/p ICD  5. 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 Chronic AF 7. NSVT 8. Gout    Plan/Discussion:   Volume status mildly elevated. CVP 9-10 . Will restart Lasix 40 mg  po twice a day. CO-OX 62. Stop Milrinone. Repeat CO-OX at 12:00. Creatinine trending down. Consider transferring to telemetry today.    No spironolactone/Ace due to renal failure.   Given lack of improvement in renal function with inotropic support, advanced options will be somewhat limited. Will need to focus closely on volume control. Home inotropes may be short-term option.  Rate controlled. INR > 3 hold Coumadin. Coumadin per pharmacy.   Length of Stay: 8  CLEGG,AMY,NP-C 7:45 AM  Patient seen and examined with Tonye Becket, NP. We discussed all aspects of the encounter. I agree with the assessment and plan as stated above.   Much improved. Tolerating milrinone wean. Weight down 12 pounds. CVP and co-ox look good. Will switch to po demadex 40  bid (was on lasix at home) - has Medicaid so can afford  No ACE/ARB due to renal failure. Continue low-dose carvedilol. If co-ox >=55% likely can go home tomorrow on current regimen. Will arrange for f/u HF clinic next week. Suspect he will need home inotropes in near future.   Darrell Aldrete,MD 9:23 AM

## 2012-02-02 NOTE — Discharge Summary (Signed)
Advanced Heart Failure Team  Discharge Summary   Patient ID: [name] MRN: [id number], DOB/AGE: 62-Oct-1952 62 y.o. Admit date: 01/25/2012 D/C date:     02/05/2012   Primary Discharge Diagnoses:  1. A/C systolic heart failure EF 20%  --with multiple readmissions  2. NICM  3. Chronic renal failure  4. H/o VT s/p ICD  5. 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 Chronic AF  7. NSVT  8. Gout  Hospital Course:  Mr. Schreur is a 62 y.o. gentlemen with multiple hospital admissions for HF exacerbations. 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 He also has history of ventricular tachycardia s/p St Jude ICD by [name], chronic kidney disease: stage III, baseline Cr ~2, and paroxysmal atrial arrhythmias controlled by amiodarone. As well as sarcoidosis, hypertension. Cath in 2008 by Dr. [name] showed no CAD.    He was admitted 01/25/12 with NYHA IIIB/IV HF with low out put symptoms. HF team consulted by [name] due worsening heart failure and renal function. RHC performed as noted with low output noted. Milrinone was started post cath. Hemodynamics monitored via swan. As his hemodynamics improved Milrionone was weaned off. He was diuresed with IV lasix and transitioned to lasix 40 mg po bid. Renal function somewhat improved with inotropic support. He failed milrinone wean on 1/30 when co-ox fell from 62 to 46%.  Milrinone was restarted and discharge co-ox 66%.  Will be continued on home inotropes and followed closely in clinic for possible advanced therapies although this may be limited given renal function.  Will be followed by [facility] for inotrope support.  Will need to follow volume status closely in the community.  He will not be placed on Ace Inhibitor due to renal failure.  Will try low dose spironolactone for hypokalemia. He diuresed ~8 liters.  Discharge weight 219  pounds.   Rate remained controlled on Amiodarone. Coumadin therapeutic per pharmacy. Coumadin will be followed at the [facility].  Amiodarone has been stopped at discharge as patient remains in chronic atrial fibrillation and rate controled on carvedilol.  Will continue to titrate at follow up.    RHC 01/26/12 4/14  RA = 14  RV = 51/16/15  PA = 57/34 (43)  PCW = 26  Fick cardiac output/index = 4.2/1.8  Thermo CO/CI = 3.9/1.7  PVR = 4.0 Woods  O2 sat = 97%  PA sat = 57%, 58%  Ao Pressure (non-invasive): 116/80 (93)  SVR = 1504   Lengthy discussions occurred about daily weights, medication compliance, low salt food choices. He will continue to be followed closely in the Heart Failure Clinic with his first follow up appointment February 09, 2012 at 10:45.    Discharge Weight Range: 218-220 pounds Discharge Vitals: Blood pressure 107/85, pulse 98, temperature 98.1 F (36.7 C), temperature source Oral, resp. rate 19, height 6\' 3"  (1.905 m), weight 219 lb 5.7 oz (99.5 kg), SpO2 95.00%.  Labs: Lab Results  Component Value Date   WBC 4.3 02/05/2012   HGB 13.3 02/05/2012   HCT 39.8 02/05/2012   MCV 86.9 02/05/2012   PLT 229 02/05/2012     Lab 02/05/12 0500  NA 134*  K 3.3*  CL 100  CO2 25  BUN 38*  CREATININE 1.96*  CALCIUM 9.4  PROT --  BILITOT --  ALKPHOS --  ALT --  AST --  GLUCOSE 153*   Lab Results  Component Value Date   CHOL 144 10/09/2011   HDL 70 10/09/2011   LDLCALC 55 10/09/2011   TRIG 96 10/09/2011   BNP (last 3 results)  Basename 02/03/12 0500 01/30/12 0326 01/27/12 0425  PROBNP 3722.0* 2314.0* 2775.0*    Diagnostic Studies/Procedures   Dg Chest Port 1 View  02/03/2012  *RADIOLOGY REPORT*  Clinical Data: Confirm PICC line placement  PORTABLE CHEST - 1 VIEW  Comparison: 01/25/2012; 01/20/2012  Findings:  Grossly unchanged enlarged cardiac silhouette mediastinal contours. Interval placement right upper extremity approach PICC line with tip projected  over the superior cavoatrial junction.  Otherwise, stable position of support apparatus.  Mild pulmonary venous congestion without frank evidence of edema. Minimal improved aeration of the bilateral lung bases with persistent left basilar/retrocardiac opacity.  No new focal airspace opacity.  No definite pleural effusion or pneumothorax.  Unchanged bones of the lower cervical ACDF, incompletely imaged.  IMPRESSION: 1.  Right upper extremity approach PICC line tip projects of the superior cavoatrial junction. 2.  Pulmonary venous congestion with improved basilar atelectasis.   Original Report Authenticated By: Tacey Ruiz, MD     Discharge Medications     Medication List     As of 02/05/2012  9:46 AM    STOP taking these medications         amiodarone 200 MG tablet   Commonly known as: PACERONE      dicyclomine 20 MG tablet   Commonly known as: BENTYL      predniSONE 10 MG tablet   Commonly known as: DELTASONE      TAKE these medications         allopurinol 100 MG tablet   Commonly known as: ZYLOPRIM   Take 1 tablet (100 mg total) by mouth daily.      carvedilol 3.125 MG tablet   Commonly known as: COREG   Take 2 tablets (6.25 mg total) by mouth 2 (two) times daily.      furosemide 80 MG tablet   Commonly known as: LASIX   Take 1 tablet (80 mg total) by mouth 2 (two) times daily.      isosorbide-hydrALAZINE 20-37.5 MG per tablet   Commonly known as: BIDIL   Take 1 tablet by mouth 3 (three) times daily.      LORazepam 1 MG tablet   Commonly known as: ATIVAN   Take 0.5 tablets (0.5 mg total) by mouth 2 (two) times daily as needed for anxiety.      milrinone 20 MG/100ML Soln infusion   Commonly known as: PRIMACOR   Inject 24.875 mcg/min into the vein continuous.      ondansetron 8 MG disintegrating tablet   Commonly known as: ZOFRAN-ODT   Take 8 mg by mouth every 8 (eight) hours as needed. 8mg  ODT q4 hours prn nausea      oxyCODONE-acetaminophen 5-325 MG per tablet    Commonly known as: PERCOCET/ROXICET   Take 1 tablet by mouth every 4 (four) hours as needed. For pain.      pantoprazole 40 MG tablet   Commonly known as: PROTONIX   Take 40 mg by mouth daily.      polyethylene glycol powder powder   Commonly known as: GLYCOLAX/MIRALAX   Take 17 g by mouth 2 (two) times daily. Until daily soft stools    OTC      rosuvastatin 5 MG tablet   Commonly known as: CRESTOR   Take 1 tablet (5 mg total) by mouth at bedtime.  spironolactone 25 MG tablet   Commonly known as: ALDACTONE   Take 0.5 tablets (12.5 mg total) by mouth daily.      warfarin 10 MG tablet   Commonly known as: COUMADIN   Take 10 mg by mouth every evening.          Disposition   The patient will be discharged in stable condition to home. Discharge Orders    Future Appointments: Provider: Department: Dept Phone: Center:   02/08/2012 2:30 PM [facility] [phone] None   02/09/2012 10:45 AM [facility] [phone] None     Future Orders Please Complete By Expires   Heart failure home health orders      Comments:   Heart Failure Follow-up Care:  Verify follow-up appointments per Patient Discharge Instructions. Confirm transportation arranged. Reconcile home medications with discharge medication list. Remove discontinued medications from use. Assist patient/caregiver to manage medications using pill box. Reinforce low sodium food selection Assessments: Vital signs and oxygen saturation at each visit. Assess home environment for safety concerns, caregiver support and availability of low-sodium foods. Consult Child psychotherapist, PT/OT, Dietitian, and CNA based on assessments. Perform comprehensive cardiopulmonary assessment. Notify MD for any change in condition or weight gain of 3 pounds in one day or 5 pounds in one week with symptoms. Daily Weights and Symptom  Monitoring: Ensure patient has access to scales. Teach patient/caregiver to weigh daily before breakfast and after voiding using same scale and record.    Teach patient/caregiver to track weight and symptoms and when to notify Provider. Activity: Develop individualized activity plan with patient/caregiver.   Questions: Responses:   Skilled Nurse to notify MD of weight trends weekly for first 2 weeks. May fax or call: [facility] Clinic at [phone] (fax) or [phone]   Heart Failure Follow-up Care [facility] at [phone]   Home Health Visits    Obtain the following labs Basic Metabolic Panel   Lab frequency Weekly   Fax lab results to [facility] Clinic at [phone]   Diet Low Sodium Heart Healthy   Fluid restrictions: 1500 mL Fluid   Diet - low sodium heart healthy      Increase activity slowly      Heart Failure patients record your daily weight using the same scale at the same time of day      STOP any activity that causes chest pain, shortness of breath, dizziness, sweating, or exessive weakness      (HEART FAILURE PATIENTS) Call MD:  Anytime you have any of the following symptoms: 1) 3 pound weight gain in 24 hours or 5 pounds in 1 week 2) shortness of breath, with or without a dry hacking cough 3) swelling in the hands, feet or stomach 4) if you have to sleep on extra pillows at night in order to breathe.      Face-to-face encounter (required for Medicare/Medicaid patients)      Comments:   I Robbi Garter certify that this patient is under my care and that I, or a nurse practitioner or physician's assistant working with me, had a face-to-face encounter that meets the physician face-to-face encounter requirements with this patient on 02/05/2012. The encounter with the patient was in whole, or in part for the following medical condition(s) which is the primary reason for home health care (List medical condition):  Systolic heart failure requiring home inotropes.   Questions: Responses:    The encounter with  the patient was in whole, or in part, for the following medical condition, which is the primary reason for home health care Systolic heart failure and home inotropes   I certify that, based on my findings, the following services are medically necessary home health services Nursing   My clinical findings support the need for the above services OTHER SEE COMMENTS   Further, I certify that my clinical findings support that this patient is homebound due to: Shortness of Breath with activity   To provide the following care/treatments RN   Contraindication to ACEI at discharge        Follow-up Information    Follow up with Arvilla Meres, MD. On 02/09/2012. (At 10:45 Garage Code is 0900)    Contact information:   [address] [place] [address] [phone]       Follow up with [facility]. On 02/08/2012. (2:30)       Follow up with [facility] . [facility] RN)    Contact information:   [phone]           Duration of Discharge Encounter: Greater than 35 minutes   Signed, Robbi Garter, [facility]  02/05/2012, 9:46 AM  Patient seen and examined with Ulyess Blossom, PA-C. We discussed all aspects of the encounter. I agree with the assessment and plan as stated above. Mr. Popper has advanced HF and failed milrinone wean in the hospital. He is being discharged on home milrinone. We will continue discussions regarding possible VAD candidacy in the outpatient setting. He will be followed closely in the HF clinic.  Truman Hayward 6:25 PM

## 2012-02-02 NOTE — Progress Notes (Signed)
CARDIAC REHAB PHASE I   PRE:  Rate/Rhythm: 93 Afib  BP:  Supine: 107/78  Sitting:   Standing:    SaO2: 95 RS  MODE:  Ambulation: 350 ft   POST:  Rate/Rhythem: 97 Afib  BP:  Supine:   Sitting: 114/78  Standing:    SaO2: 97 RA [id number] Assisted X 1 to ambulate, Gait steady. Vs stable . Pt tolerated ambulation well without c/o of SOB. Unable to get any further due to it being to cold in hallway for him. reviewed CHF education with pt,fluid restrictions and encouraged daily exercise. He voices understanding.  Beatrix Fetters

## 2012-02-03 ENCOUNTER — Inpatient Hospital Stay (HOSPITAL_COMMUNITY): Payer: PRIVATE HEALTH INSURANCE

## 2012-02-03 LAB — BASIC METABOLIC PANEL
BUN: 33 mg/dL — ABNORMAL HIGH (ref 6–23)
CO2: 26 mEq/L (ref 19–32)
Chloride: 99 mEq/L (ref 96–112)
Creatinine, Ser: 2 mg/dL — ABNORMAL HIGH (ref 0.50–1.35)

## 2012-02-03 LAB — CBC
MCHC: 34 g/dL (ref 30.0–36.0)
RDW: 14 % (ref 11.5–15.5)

## 2012-02-03 LAB — PRO B NATRIURETIC PEPTIDE: Pro B Natriuretic peptide (BNP): 3722 pg/mL — ABNORMAL HIGH (ref 0–125)

## 2012-02-03 LAB — PROTIME-INR
INR: 3.5 — ABNORMAL HIGH (ref 0.00–1.49)
Prothrombin Time: 33.1 seconds — ABNORMAL HIGH (ref 11.6–15.2)

## 2012-02-03 MED ORDER — SODIUM CHLORIDE 0.9 % IJ SOLN: 10.0000 mL | INTRAMUSCULAR | Status: DC | PRN

## 2012-02-03 MED ORDER — MILRINONE IN DEXTROSE 20 MG/100ML IV SOLN
0.2500 ug/kg/min | INTRAVENOUS | Status: DC
  Administered 2012-02-03 – 2012-02-05 (×3): 0.25 ug/kg/min via INTRAVENOUS
  Filled 2012-02-03 (×4): qty 100

## 2012-02-03 MED ORDER — FUROSEMIDE 10 MG/ML IJ SOLN
80.0000 mg | Freq: Two times a day (BID) | INTRAMUSCULAR | Status: AC
  Administered 2012-02-03 (×2): 80 mg via INTRAVENOUS
  Filled 2012-02-03: qty 8

## 2012-02-03 MED ORDER — SODIUM CHLORIDE 0.9 % IJ SOLN
10.0000 mL | Freq: Two times a day (BID) | INTRAMUSCULAR | Status: DC
  Administered 2012-02-03: 20 mL
  Administered 2012-02-04 (×2): 10 mL

## 2012-02-03 NOTE — Plan of Care (Signed)
Problem: Phase III Progression Outcomes Goal: Discharge plan remains appropriate-arrangements made Outcome: Progressing Case manager aware of plan to dc Sunday 02/04/12 on home milrinone, for picc placement today.

## 2012-02-03 NOTE — Progress Notes (Signed)
. Advanced Heart Failure Rounding Note   Subjective:    [name] is a 62 y.o. gentlemen with multiple hospital admissions for HF exacerbations. 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 He also has history of ventricular tachycardia s/p St Jude ICD by [name], chronic kidney disease: stage III, baseline Cr ~2, and paroxysmal atrial arrhythmias controlled by amiodarone. As well as sarcoidosis, hypertension. Cath in 2008 by Dr. [name] showed no CAD. Dry weight is 219 (11/2011).   Primary cardiologist [name]. Admitted with NYHA IIIB/IV HF with low out put symptoms. RHC performed with low output. Milrinone post cath. Over the weekend BIDIL titrated up and he was switched to po lasix.  Yesterday received 80 mg IV lasix every 8 hours and Metolazone. Hydralazine increased to 75 mg tid. Overall weight down 9 pounds. 24 hour I/O -2.3.   RHC 01/26/12 RA = 14  RV = 51/16/15  PA = 57/34 (43)  PCW = 26  Fick cardiac output/index = 4.2/1.8  Thermo CO/CI = 3.9/1.7  PVR = 4.0 Woods  O2 sat = 97%  PA sat = 57%, 58%  Ao Pressure (non-invasive): 116/80 (93)  SVR = 1504   Milrinone turned off yesterday. Had episode of orthopnea last night. More dyspneic brushing his teeth. CVP 12  Co-ox back down to 43% (63->47->43). Renal function stable with Cr = 2.0.     Objective:   Weight Range:  Vital Signs:   Temp:  [97.4 F (36.3 C)-98.4 F (36.9 C)] 98.4 F (36.9 C) (02/01 0819) Pulse Rate:  [70-87] 85  (02/01 0819) Resp:  [14-18] 16  (02/01 0819) BP: (93-139)/(72-116) 93/72 mmHg (02/01 0819) SpO2:  [94 %-97 %] 96 % (02/01 0819) Weight:  [99.882 kg (220 lb 3.2 oz)] 99.882 kg (220 lb 3.2 oz) (02/01 0500) Last BM Date: 02/02/12  Weight change: Filed Weights   02/01/12 0500 02/02/12 0500 02/03/12 0500  Weight: 99.8 kg (220 lb 0.3 oz) 100 kg (220 lb 7.4 oz) 99.882 kg (220 lb 3.2 oz)     Intake/Output:   Intake/Output Summary (Last 24 hours) at 02/03/12 0839 Last data filed at 02/03/12 0700  Gross per 24 hour  Intake   1040 ml  Output   1125 ml  Net    -85 ml     Physical Exam: CVP 12 General: Well appearing. No resp difficulty  HEENT: normal  Neck: supple. JVP jaw. Carotids 2+ bilat; no bruits. No lymphadenopathy or thryomegaly appreciated. R IJ swan sleeve Cor: PMI nondisplaced. Regular rate & rhythm. 3/5m MR. +S3  Lungs: clear 2 liters  Abdomen: soft, nontender, obese, + distention. Palpable liver edge. No bruits or masses. Good bowel sounds.  Extremities: no cyanosis, clubbing, rash, no lower extremity edema  Neuro: alert & orientedx3, cranial nerves grossly intact. moves all 4 extremities w/o difficulty. Affect pleasant     Telemetry: A Fib 98  Labs: Basic Metabolic Panel:  Lab 02/03/12 1610 02/02/12 0430 02/01/12 0355 01/31/12 0430 01/30/12 0326 01/28/12 0445  NA 134* 132* 134* 133* 133* --  K 4.0 4.1 4.0 3.9 4.1 --  CL 99 99 98 96 93* --  CO2 26 24 26 27 29  --  GLUCOSE 98 82 101* 108* 102* --  BUN 33* 30* 29* 33* 33* --  CREATININE 2.00* 1.91* 2.03* 2.16* 2.46* --  CALCIUM 9.9 9.8 10.1 -- -- --  MG -- -- -- -- -- 2.0  PHOS -- -- -- -- -- --    Liver Function Tests: No results found for this basename: AST:5,ALT:5,ALKPHOS:5,BILITOT:5,PROT:5,ALBUMIN:5 in the last 168 hours No results found for this basename: LIPASE:5,AMYLASE:5 in the last 168 hours No results found for this basename: AMMONIA:3 in the last 168 hours  CBC:  Lab 02/03/12 0500 02/02/12 0430 02/01/12 0355 01/31/12 0430 01/30/12 0326 01/28/12 0445  WBC 5.9 6.0 5.5 6.9 5.8 --  NEUTROABS -- -- -- -- -- 1.9  HGB 13.0 12.9* 13.3 13.7 13.8 --  HCT 38.2* 38.1* 40.3 39.7 40.2 --  MCV 86.8 85.8 85.6 85.7 85.7 --  PLT 235 202 197 182 193 --    Cardiac Enzymes: No results found for this basename: CKTOTAL:5,CKMB:5,CKMBINDEX:5,TROPONINI:5 in the last 168 hours  BNP: BNP (last 3  results)  Basename 02/03/12 0500 01/30/12 0326 01/27/12 0425  PROBNP 3722.0* 2314.0* 2775.0*     Other results:     Imaging: No results found.   Medications:     Scheduled Medications:    . allopurinol  100 mg Oral Daily  . amiodarone  200 mg Oral Daily  . atorvastatin  20 mg Oral q1800  . carvedilol  6.25 mg Oral BID WC  . hydrALAZINE  75 mg Oral TID  . isosorbide mononitrate  30 mg Oral Daily  . pantoprazole  40 mg Oral Daily  . polyethylene glycol  17 g Oral BID  . Warfarin - Pharmacist Dosing Inpatient   Does not apply q1800    Infusions:    . sodium chloride 20 mL/hr at 02/01/12 1726    PRN Medications: acetaminophen, ALPRAZolam, HYDROmorphone (DILAUDID) injection, oxyCODONE-acetaminophen, zolpidem   Assessment:  1. A/C systolic heart failure EF 20%  --with multiple readmissions  2. NICM  3. Chronic renal failure  4. H/o VT s/p ICD  5. 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 Chronic AF 7. NSVT 8. Gout    Plan/Discussion:    He has failed milrinone wean and is back in shock. He will need home inotropes. Will order PICC. Milrinone form filled out. Will contact IV team and case management. Give IV lasix today.   He has been very compliant in house and I suspect he might do well with a VAD if we can keep renal function stable. Will need routine cotinine and drug screens based on previous history.   No spironolactone/Ace due to renal failure.   AF is rate controlled Coumadin per pharmacy.  [name] 8:39 AM

## 2012-02-03 NOTE — Progress Notes (Signed)
Subjective:  Last night short of breath again. Afebrile. Co-Ox down to 43 % per Dr. Gala Romney.  Objective:  Vital Signs in the last 24 hours: Temp:  [97.4 F (36.3 C)-98.4 F (36.9 C)] 98.4 F (36.9 C) (02/01 0819) Pulse Rate:  [70-87] 85  (02/01 0819) Cardiac Rhythm:  [-] Atrial fibrillation (01/31 2000) Resp:  [14-18] 16  (02/01 0819) BP: (93-139)/(72-116) 93/72 mmHg (02/01 0819) SpO2:  [94 %-97 %] 96 % (02/01 0819) Weight:  [99.882 kg (220 lb 3.2 oz)] 99.882 kg (220 lb 3.2 oz) (02/01 0500)  Physical Exam: BP Readings from Last 1 Encounters:  02/03/12 93/72    Wt Readings from Last 1 Encounters:  02/03/12 99.882 kg (220 lb 3.2 oz)    Weight change: -0.118 kg (-4.2 oz)  HEENT: [place]/AT, Eyes-Brown, PERL, EOMI, Conjunctiva-Pink, Sclera-Non-icteric Neck: No JVD, No bruit, Trachea midline. Lungs:  Clearing, Bilateral. Cardiac:  Regular rhythm, normal S1 and S2, no S3.  Abdomen:  Soft, non-tender. Extremities:  No edema present. No cyanosis. No clubbing. CNS: AxOx3, Cranial nerves grossly intact, moves all 4 extremities. Right handed. Skin: Warm and dry.   Intake/Output from previous day: 01/31 0701 - 02/01 0700 In: 1050 [P.O.:700; I.V.:350] Out: 1125 [Urine:1125]    Lab Results: BMET    Component Value Date/Time   NA 134* 02/03/2012 0500   K 4.0 02/03/2012 0500   CL 99 02/03/2012 0500   CO2 26 02/03/2012 0500   GLUCOSE 98 02/03/2012 0500   BUN 33* 02/03/2012 0500   CREATININE 2.00* 02/03/2012 0500   CALCIUM 9.9 02/03/2012 0500   GFRNONAA 34* 02/03/2012 0500   GFRAA 39* 02/03/2012 0500   CBC    Component Value Date/Time   WBC 5.9 02/03/2012 0500   RBC 4.40 02/03/2012 0500   HGB 13.0 02/03/2012 0500   HCT 38.2* 02/03/2012 0500   PLT 235 02/03/2012 0500   MCV 86.8 02/03/2012 0500   MCH 29.5 02/03/2012 0500   MCHC 34.0 02/03/2012 0500   RDW 14.0 02/03/2012 0500   LYMPHSABS 2.4 01/28/2012 0445   MONOABS 0.9 01/28/2012 0445   EOSABS 0.5 01/28/2012 0445   BASOSABS 0.0 01/28/2012 0445   CARDIAC  ENZYMES Lab Results  Component Value Date   CKTOTAL 50 10/10/2011   CKMB 2.6 10/10/2011   TROPONINI <0.30 01/26/2012    Scheduled Meds:   . allopurinol  100 mg Oral Daily  . amiodarone  200 mg Oral Daily  . atorvastatin  20 mg Oral q1800  . carvedilol  6.25 mg Oral BID WC  . furosemide  80 mg Intravenous BID  . hydrALAZINE  75 mg Oral TID  . isosorbide mononitrate  30 mg Oral Daily  . pantoprazole  40 mg Oral Daily  . polyethylene glycol  17 g Oral BID  . Warfarin - Pharmacist Dosing Inpatient   Does not apply q1800   Continuous Infusions:   . sodium chloride 20 mL/hr at 02/01/12 1726  . milrinone     PRN Meds:.acetaminophen, ALPRAZolam, HYDROmorphone (DILAUDID) injection, oxyCODONE-acetaminophen, zolpidem  Assessment/Plan:  Patient Active Hospital Problem List:  Resolving decompensated systolic heart failure  Status post right heart cath  Severe nonischemic dilated cardiomyopathy status post ICD  Hypertension  Hypercholesteremia  Acute on Chronic kidney disease stage IV multifactorial stable.  Chronic atrial fibrillation  History of nonsustained VT in the past  History of sarcoidosis  History of Cocaine abuse   Follow with heart failure team   LOS: 9 days    Orpah Cobb  MD  02/03/2012, 10:17 AM

## 2012-02-03 NOTE — Progress Notes (Signed)
NCM contacted [facility] with instructions for dc on 2/2 with IV Milrinone. NCM contacted [facility] for [facility] RN set up time on 2/2. Will need dc time early in am to make [facility], [phone] aware so they can have [facility] RN come to pt's room to set up medication. [facility] contact info added to dc instructions. Isidoro Donning RN CCM Case Mgmt phone [phone]

## 2012-02-03 NOTE — Progress Notes (Signed)
ANTICOAGULATION CONSULT NOTE - Follow Up Consult  Pharmacy Consult for Coumadin Indication: atrial fibrillation  Allergies  Allergen Reactions  . Ativan (Lorazepam) Other (See Comments)    Didn't like the way it made him feel; hallucinations, talking to himself    Patient Measurements: Height: 6\' 3"  (190.5 cm) Weight: 220 lb 3.2 oz (99.882 kg) IBW/kg (Calculated) : 84.5   Vital Signs: Temp: 98.4 F (36.9 C) (02/01 0819) Temp src: Oral (02/01 0819) BP: 105/72 mmHg (02/01 1240) Pulse Rate: 85  (02/01 0819)  Labs:  Basename 02/03/12 0500 02/02/12 0430 02/01/12 0355  HGB 13.0 12.9* --  HCT 38.2* 38.1* 40.3  PLT 235 202 197  APTT -- -- --  LABPROT 33.1* 33.0* 25.5*  INR 3.50* 3.48* 2.46*  HEPARINUNFRC -- -- --  CREATININE 2.00* 1.91* 2.03*  CKTOTAL -- -- --  CKMB -- -- --  TROPONINI -- -- --    Estimated Creatinine Clearance: 45.8 ml/min (by C-G formula based on Cr of 2).   Medications:  Scheduled:    . allopurinol  100 mg Oral Daily  . amiodarone  200 mg Oral Daily  . atorvastatin  20 mg Oral q1800  . carvedilol  6.25 mg Oral BID WC  . furosemide  80 mg Intravenous BID  . hydrALAZINE  75 mg Oral TID  . isosorbide mononitrate  30 mg Oral Daily  . pantoprazole  40 mg Oral Daily  . polyethylene glycol  17 g Oral BID  . Warfarin - Pharmacist Dosing Inpatient   Does not apply q1800   Infusions:    . sodium chloride 20 mL/hr at 02/01/12 1726  . milrinone 0.25 mcg/kg/min (02/03/12 1300)    Assessment: [name] continues on coumadin for afib. INR increased quickly and remains supratherapeutic (3.5) despite holding last night's dose. CBC stable. No bleeding reported. Continues on amiodarone as pta.   Goal of Therapy:  INR 2-3 Monitor platelets by anticoagulation protocol: Yes   Plan:  1) No coumadin tonight  2) Follow up INR in AM  [name], PharmD Clinical Pharmacist Pager: [phone] Phone: [phone] 02/03/2012 2:55 PM

## 2012-02-04 LAB — BASIC METABOLIC PANEL
Calcium: 9.7 mg/dL (ref 8.4–10.5)
Chloride: 98 mEq/L (ref 96–112)
Creatinine, Ser: 2.52 mg/dL — ABNORMAL HIGH (ref 0.50–1.35)
GFR calc Af Amer: 30 mL/min — ABNORMAL LOW (ref >90)
GFR calc non Af Amer: 26 mL/min — ABNORMAL LOW (ref >90)

## 2012-02-04 LAB — CARBOXYHEMOGLOBIN
Carboxyhemoglobin: 1.5 % (ref 0.5–1.5)
Methemoglobin: 0.8 % (ref 0.0–1.5)
O2 Saturation: 64.5 %

## 2012-02-04 LAB — CBC
HCT: 37.5 % — ABNORMAL LOW (ref 39.0–52.0)
Hemoglobin: 12.5 g/dL — ABNORMAL LOW (ref 13.0–17.0)
MCHC: 33.3 g/dL (ref 30.0–36.0)
RBC: 4.32 MIL/uL (ref 4.22–5.81)

## 2012-02-04 LAB — PROTIME-INR: INR: 3.09 — ABNORMAL HIGH (ref 0.00–1.49)

## 2012-02-04 MED ORDER — POTASSIUM CHLORIDE CRYS ER 20 MEQ PO TBCR
40.0000 meq | EXTENDED_RELEASE_TABLET | Freq: Once | ORAL | Status: AC
  Administered 2012-02-04: 40 meq via ORAL
  Filled 2012-02-04: qty 2

## 2012-02-04 MED ORDER — FUROSEMIDE 80 MG PO TABS
80.0000 mg | ORAL_TABLET | Freq: Two times a day (BID) | ORAL | Status: DC
  Administered 2012-02-04 – 2012-02-05 (×2): 80 mg via ORAL
  Filled 2012-02-04 (×4): qty 1

## 2012-02-04 MED ORDER — POTASSIUM CHLORIDE 20 MEQ PO PACK
40.0000 meq | PACK | Freq: Once | ORAL | Status: DC
  Filled 2012-02-04: qty 2

## 2012-02-04 MED ORDER — HYDROCORTISONE 0.5 % EX CREA
TOPICAL_CREAM | Freq: Three times a day (TID) | CUTANEOUS | Status: DC
  Administered 2012-02-04: 22:00:00 via TOPICAL
  Filled 2012-02-04: qty 28.35

## 2012-02-04 NOTE — Progress Notes (Signed)
ANTICOAGULATION CONSULT NOTE - Follow Up Consult  Pharmacy Consult for Coumadin Indication: atrial fibrillation  Allergies  Allergen Reactions  . Ativan (Lorazepam) Other (See Comments)    Didn't like the way it made him feel; hallucinations, talking to himself    Patient Measurements: Height: 6\' 3"  (190.5 cm) Weight: 218 lb 14.7 oz (99.3 kg) IBW/kg (Calculated) : 84.5   Vital Signs: Temp: 98.8 F (37.1 C) (02/02 1150) Temp src: Oral (02/02 1150) BP: 94/67 mmHg (02/02 0755)  Labs:  Basename 02/04/12 0350 02/03/12 0500 02/02/12 0430  HGB 12.5* 13.0 --  HCT 37.5* 38.2* 38.1*  PLT 224 235 202  APTT -- -- --  LABPROT 30.2* 33.1* 33.0*  INR 3.09* 3.50* 3.48*  HEPARINUNFRC -- -- --  CREATININE 2.52* 2.00* 1.91*  CKTOTAL -- -- --  CKMB -- -- --  TROPONINI -- -- --    Estimated Creatinine Clearance: 36.3 ml/min (by C-G formula based on Cr of 2.52).   Medications:  Scheduled:    . allopurinol  100 mg Oral Daily  . amiodarone  200 mg Oral Daily  . atorvastatin  20 mg Oral q1800  . carvedilol  6.25 mg Oral BID WC  . [COMPLETED] furosemide  80 mg Intravenous BID  . furosemide  80 mg Oral BID  . hydrALAZINE  75 mg Oral TID  . isosorbide mononitrate  30 mg Oral Daily  . pantoprazole  40 mg Oral Daily  . polyethylene glycol  17 g Oral BID  . [COMPLETED] potassium chloride  40 mEq Oral Once  . sodium chloride  10-40 mL Intracatheter Q12H  . Warfarin - Pharmacist Dosing Inpatient   Does not apply q1800  . [DISCONTINUED] potassium chloride  40 mEq Oral Once    Assessment: [name] continues on coumadin for afib. INR increased quickly and remains supratherapeutic (3.09) despite holding several doses. CBC stable. No bleeding reported. Continues on amiodarone as pta.  Goal of Therapy:  INR 2-3 Monitor platelets by anticoagulation protocol: Yes   Plan:  - No Coumadin again tonight - If discharged, would recommend, again, holding tonight's dose then resuming home regimen  tomorrow (with INR f/u in a few days)  [name], PharmD Clinical Pharmacist 02/04/2012 3:18 PM

## 2012-02-04 NOTE — Progress Notes (Signed)
Patient ID: ZAE KIRTZ, male   DOB: 1950/06/27, 62 y.o.   MRN: [id number] . Advanced Heart Failure Rounding Note   Subjective:    [name] is a 62 y.o. gentlemen with multiple hospital admissions for HF exacerbations. 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 He also has history of ventricular tachycardia s/p St Jude ICD by [name], chronic kidney disease: stage III, baseline Cr ~2, and paroxysmal atrial arrhythmias controlled by amiodarone. As well as sarcoidosis, hypertension. Cath in 2008 by Dr. [name] showed no CAD. Dry weight is 219 (11/2011).   Primary cardiologist [name]. Admitted with NYHA IIIB/IV HF with low out put symptoms. RHC performed with low output. Milrinone post cath.   RHC 01/26/12 RA = 14  RV = 51/16/15  PA = 57/34 (43)  PCW = 26  Fick cardiac output/index = 4.2/1.8  Thermo CO/CI = 3.9/1.7  PVR = 4.0 Woods  O2 sat = 97%  PA sat = 57%, 58%  Ao Pressure (non-invasive): 116/80 (93)  SVR = 1504   Milrinone turned off Friday. Had episode of orthopnea last night. More dyspneic brushing his teeth. CVP 12 and Co-ox down to 43% on Saturday so milrinone was restarted at 0.25.  He was given 2 doses of IV Lasix yesterday and weight is down 2 lbs.  Creatinine up to 2.5. PICC was placed.  CVP this morning is about 8.  He is less short of breath back on milrinone but still dyspneic with exertion.    Objective:   Weight Range:  Vital Signs:   Temp:  [98 F (36.7 C)-99.1 F (37.3 C)] 98.7 F (37.1 C) (02/02 0755) Pulse Rate:  [86-87] 87  (02/01 2324) Resp:  [14-16] 16  (02/02 0755) BP: (94-152)/(59-109) 94/67 mmHg (02/02 0755) SpO2:  [95 %-98 %] 98 % (02/02 0755) FiO2 (%):  [2 %] 2 % (02/01 1546) Weight:  [218 lb 14.7 oz (99.3 kg)] 218 lb 14.7 oz (99.3 kg) (02/02 0300) Last BM Date: 02/03/12  Weight change: Filed Weights   02/02/12 0500 02/03/12  0500 02/04/12 0300  Weight: 220 lb 7.4 oz (100 kg) 220 lb 3.2 oz (99.882 kg) 218 lb 14.7 oz (99.3 kg)    Intake/Output:   Intake/Output Summary (Last 24 hours) at 02/04/12 1009 Last data filed at 02/04/12 0900  Gross per 24 hour  Intake 1425.05 ml  Output   2450 ml  Net -1024.95 ml     Physical Exam: CVP 12 General: Well appearing. No resp difficulty  HEENT: normal  Neck: supple. JVP jaw. Carotids 2+ bilat; no bruits. No lymphadenopathy or thryomegaly appreciated. R IJ swan sleeve Cor: PMI nondisplaced. Regular rate & rhythm. 3/49m MR. +S3  Lungs: clear 2 liters  Abdomen: soft, nontender, obese, + distention. Palpable liver edge. No bruits or masses. Good bowel sounds.  Extremities: no cyanosis, clubbing, rash, no lower extremity edema  Neuro: alert & orientedx3, cranial nerves grossly intact. moves all 4 extremities w/o difficulty. Affect pleasant     Telemetry: A Fib 98  Labs: Basic Metabolic Panel:  Lab 02/04/12 1478 02/03/12 0500 02/02/12 0430 02/01/12 0355 01/31/12 0430  NA 135 134* 132* 134* 133*  K 3.3* 4.0 4.1 4.0 3.9  CL 98 99 99 98 96  CO2 25 26 24 26 27   GLUCOSE 138* 98 82 101* 108*  BUN 43* 33* 30* 29* 33*  CREATININE 2.52* 2.00* 1.91* 2.03* 2.16*  CALCIUM  9.7 9.9 9.8 -- --  MG -- -- -- -- --  PHOS -- -- -- -- --    Liver Function Tests: No results found for this basename: AST:5,ALT:5,ALKPHOS:5,BILITOT:5,PROT:5,ALBUMIN:5 in the last 168 hours No results found for this basename: LIPASE:5,AMYLASE:5 in the last 168 hours No results found for this basename: AMMONIA:3 in the last 168 hours  CBC:  Lab 02/04/12 0350 02/03/12 0500 02/02/12 0430 02/01/12 0355 01/31/12 0430  WBC 4.8 5.9 6.0 5.5 6.9  NEUTROABS -- -- -- -- --  HGB 12.5* 13.0 12.9* 13.3 13.7  HCT 37.5* 38.2* 38.1* 40.3 39.7  MCV 86.8 86.8 85.8 85.6 85.7  PLT 224 235 202 197 182    Cardiac Enzymes: No results found for this basename: CKTOTAL:5,CKMB:5,CKMBINDEX:5,TROPONINI:5 in the last 168  hours  BNP: BNP (last 3 results)  Basename 02/03/12 0500 01/30/12 0326 01/27/12 0425  PROBNP 3722.0* 2314.0* 2775.0*     Other results:     Imaging: Dg Chest Port 1 View  02/03/2012  *RADIOLOGY REPORT*  Clinical Data: Confirm PICC line placement  PORTABLE CHEST - 1 VIEW  Comparison: 01/25/2012; 01/20/2012  Findings:  Grossly unchanged enlarged cardiac silhouette mediastinal contours. Interval placement right upper extremity approach PICC line with tip projected over the superior cavoatrial junction.  Otherwise, stable position of support apparatus.  Mild pulmonary venous congestion without frank evidence of edema. Minimal improved aeration of the bilateral lung bases with persistent left basilar/retrocardiac opacity.  No new focal airspace opacity.  No definite pleural effusion or pneumothorax.  Unchanged bones of the lower cervical ACDF, incompletely imaged.  IMPRESSION: 1.  Right upper extremity approach PICC line tip projects of the superior cavoatrial junction. 2.  Pulmonary venous congestion with improved basilar atelectasis.   Original Report Authenticated By: Tacey Ruiz, MD      Medications:     Scheduled Medications:    . allopurinol  100 mg Oral Daily  . amiodarone  200 mg Oral Daily  . atorvastatin  20 mg Oral q1800  . carvedilol  6.25 mg Oral BID WC  . furosemide  80 mg Oral BID  . hydrALAZINE  75 mg Oral TID  . isosorbide mononitrate  30 mg Oral Daily  . pantoprazole  40 mg Oral Daily  . polyethylene glycol  17 g Oral BID  . potassium chloride  40 mEq Oral Once  . sodium chloride  10-40 mL Intracatheter Q12H  . Warfarin - Pharmacist Dosing Inpatient   Does not apply q1800    Infusions:    . sodium chloride 10 mL (02/03/12 2148)  . milrinone 0.25 mcg/kg/min (02/03/12 2353)    PRN Medications: acetaminophen, ALPRAZolam, HYDROmorphone (DILAUDID) injection, oxyCODONE-acetaminophen, sodium chloride, zolpidem   Assessment:  1. A/C systolic heart failure EF  20%  --with multiple readmissions  2. NICM  3. Chronic renal failure  4. H/o VT s/p ICD  5. 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 Chronic AF 7. NSVT 8. Gout    Plan/Discussion:    He failed milrinone wean and will need home inotropes. PICC is in place. Milrinone form filled out. Will contact IV team and case management. Remove IJ today.   He has been very compliant in house and I suspect he might do well with a VAD if we can keep renal function stable.  Creatinine is up a bit today and CVP 8.  I will put him on po Lasix today (had IV yesterday).   Will need routine drug screens based on  previous history.   No spironolactone/Ace due to renal failure.   AF is rate controlled.  Coumadin per pharmacy.  Consider stopping amiodarone and titrating up Coreg a bit given chronic atrial fibrillation.   Darrell Paprocki,MD 10:09 AM

## 2012-02-04 NOTE — Progress Notes (Signed)
Subjective:  Has anxiety over going home today. Also K+ dropped to 3.3 meq.T max 99.1  Objective:  Vital Signs in the last 24 hours: Temp:  [98 F (36.7 C)-99.1 F (37.3 C)] 98.7 F (37.1 C) (02/02 0755) Pulse Rate:  [86-87] 87  (02/01 2324) Cardiac Rhythm:  [-] Atrial fibrillation (02/02 0755) Resp:  [14-16] 16  (02/02 0755) BP: (94-152)/(59-109) 94/67 mmHg (02/02 0755) SpO2:  [95 %-98 %] 98 % (02/02 0755) FiO2 (%):  [2 %] 2 % (02/01 1546) Weight:  [99.3 kg (218 lb 14.7 oz)] 99.3 kg (218 lb 14.7 oz) (02/02 0300)  Physical Exam: BP Readings from Last 1 Encounters:  02/04/12 94/67    Wt Readings from Last 1 Encounters:  02/04/12 99.3 kg (218 lb 14.7 oz)    Weight change: -0.582 kg (-1 lb 4.5 oz)  HEENT: [place]/AT, Eyes-Brown, PERL, EOMI, Conjunctiva-Pink, Sclera-Non-icteric Neck: No JVD, No bruit, Trachea midline. Lungs:  Clear, Bilateral. Cardiac:  Regular rhythm, normal S1 and S2, no S3. II/VI systolic murmur. Abdomen:  Soft, non-tender. Extremities:  No edema present. No cyanosis. No clubbing. CNS: AxOx3, Cranial nerves grossly intact, moves all 4 extremities. Right handed. Skin: Warm and dry.   Intake/Output from previous day: 02/01 0701 - 02/02 0700 In: 1560.1 [P.O.:1060; I.V.:492.1; IV Piggyback:8] Out: 2650 [Urine:2650]    Lab Results: BMET    Component Value Date/Time   NA 135 02/04/2012 0350   K 3.3* 02/04/2012 0350   CL 98 02/04/2012 0350   CO2 25 02/04/2012 0350   GLUCOSE 138* 02/04/2012 0350   BUN 43* 02/04/2012 0350   CREATININE 2.52* 02/04/2012 0350   CALCIUM 9.7 02/04/2012 0350   GFRNONAA 26* 02/04/2012 0350   GFRAA 30* 02/04/2012 0350   CBC    Component Value Date/Time   WBC 4.8 02/04/2012 0350   RBC 4.32 02/04/2012 0350   HGB 12.5* 02/04/2012 0350   HCT 37.5* 02/04/2012 0350   PLT 224 02/04/2012 0350   MCV 86.8 02/04/2012 0350   MCH 28.9 02/04/2012 0350   MCHC 33.3 02/04/2012 0350   RDW 13.9 02/04/2012 0350   LYMPHSABS 2.4 01/28/2012 0445   MONOABS 0.9 01/28/2012 0445   EOSABS 0.5 01/28/2012 0445   BASOSABS 0.0 01/28/2012 0445   CARDIAC ENZYMES Lab Results  Component Value Date   CKTOTAL 50 10/10/2011   CKMB 2.6 10/10/2011   TROPONINI <0.30 01/26/2012    Scheduled Meds:   . allopurinol  100 mg Oral Daily  . amiodarone  200 mg Oral Daily  . atorvastatin  20 mg Oral q1800  . carvedilol  6.25 mg Oral BID WC  . furosemide  80 mg Oral BID  . hydrALAZINE  75 mg Oral TID  . isosorbide mononitrate  30 mg Oral Daily  . pantoprazole  40 mg Oral Daily  . polyethylene glycol  17 g Oral BID  . sodium chloride  10-40 mL Intracatheter Q12H  . Warfarin - Pharmacist Dosing Inpatient   Does not apply q1800   Continuous Infusions:   . sodium chloride 10 mL (02/03/12 2148)  . milrinone 0.25 mcg/kg/min (02/03/12 2353)   PRN Meds:.acetaminophen, ALPRAZolam, HYDROmorphone (DILAUDID) injection, oxyCODONE-acetaminophen, sodium chloride, zolpidem  Assessment/Plan:  Patient Active Hospital Problem List: Resolving decompensated systolic heart failure  Status post right heart cath  Severe nonischemic dilated cardiomyopathy status post ICD  Hypertension  Hypercholesteremia  Acute on Chronic kidney disease, stage IV multifactorial stable.  Chronic atrial fibrillation  History of nonsustained VT in the past  History of  sarcoidosis  History of Cocaine abuse  Hypokalemia  K+ supplementation   LOS: 10 days    Orpah Cobb  MD  02/04/2012, 11:18 AM

## 2012-02-05 LAB — BASIC METABOLIC PANEL
CO2: 25 mEq/L (ref 19–32)
Chloride: 100 mEq/L (ref 96–112)
Creatinine, Ser: 1.96 mg/dL — ABNORMAL HIGH (ref 0.50–1.35)
Glucose, Bld: 153 mg/dL — ABNORMAL HIGH (ref 70–99)

## 2012-02-05 LAB — CBC
HCT: 39.8 % (ref 39.0–52.0)
MCHC: 33.4 g/dL (ref 30.0–36.0)
Platelets: 229 10*3/uL (ref 150–400)
RDW: 14.1 % (ref 11.5–15.5)
WBC: 4.3 10*3/uL (ref 4.0–10.5)

## 2012-02-05 LAB — CARBOXYHEMOGLOBIN: Methemoglobin: 0 % (ref 0.0–1.5)

## 2012-02-05 LAB — PROTIME-INR: INR: 2.35 — ABNORMAL HIGH (ref 0.00–1.49)

## 2012-02-05 MED ORDER — FUROSEMIDE 80 MG PO TABS: 80.0000 mg | ORAL_TABLET | Freq: Two times a day (BID) | ORAL | Status: DC

## 2012-02-05 MED ORDER — CARVEDILOL 3.125 MG PO TABS: 6.2500 mg | ORAL_TABLET | Freq: Two times a day (BID) | ORAL | Status: DC

## 2012-02-05 MED ORDER — ISOSORB DINITRATE-HYDRALAZINE 20-37.5 MG PO TABS: 1.0000 | ORAL_TABLET | Freq: Three times a day (TID) | ORAL | Status: DC

## 2012-02-05 MED ORDER — ALUM & MAG HYDROXIDE-SIMETH 200-200-20 MG/5ML PO SUSP
30.0000 mL | ORAL | Status: DC | PRN
  Administered 2012-02-05: 30 mL via ORAL
  Filled 2012-02-05: qty 30

## 2012-02-05 MED ORDER — ALLOPURINOL 100 MG PO TABS: 100.0000 mg | ORAL_TABLET | Freq: Every day | ORAL | Status: AC

## 2012-02-05 MED ORDER — WARFARIN SODIUM 10 MG PO TABS
10.0000 mg | ORAL_TABLET | Freq: Once | ORAL | Status: DC
  Filled 2012-02-05: qty 1

## 2012-02-05 MED ORDER — MILRINONE IN DEXTROSE 20 MG/100ML IV SOLN: 0.2500 ug/kg/min | INTRAVENOUS | Status: DC

## 2012-02-05 MED ORDER — SPIRONOLACTONE 25 MG PO TABS: 12.5000 mg | ORAL_TABLET | Freq: Every day | ORAL | Status: DC

## 2012-02-05 MED ORDER — POTASSIUM CHLORIDE CRYS ER 20 MEQ PO TBCR
40.0000 meq | EXTENDED_RELEASE_TABLET | ORAL | Status: DC
  Administered 2012-02-05: 40 meq via ORAL
  Filled 2012-02-05: qty 2

## 2012-02-05 NOTE — Progress Notes (Signed)
Pt is scheduled a CVP set up change but RN informed RT that pt will be leaving this morning and being sent to an adult center and will not need a new set up.

## 2012-02-05 NOTE — Progress Notes (Signed)
CARDIAC REHAB PHASE I   PRE:  Rate/Rhythm: 92Afib  BP:  Supine:   Sitting: 100/63  Standing:    SaO2: 96%RA  MODE:  Ambulation: 500 ft   POST:  Rate/Rhythm: 105Afib  BP:  Supine:   Sitting: 107/85  Standing:    SaO2: 96%RA 0850-0926 Pt ambulated well today independently. Pt stated that he got up every couple of hours and walked around the halls. Educated on daily weights, sodium reduction, and exercise. Pt is motivated and willing to work on behavior changes. Returned pt to side of bed with call light and phone in reach.   Deetta Perla

## 2012-02-05 NOTE — Progress Notes (Signed)
[facility]  Patient Status: New patient to [facility] services  [facility] is providing the following services:   Mr. Copeman will receive [facility] nursing and Infusion [facility] services.  Plan is for D/C home today.   [facility] Infusion Coordinator will connect patient to [facility] ambulatory pump for continuous Milrinone infusion prior to D/C home.  [facility] RN will make home visit in the a.m. For continued teaching and assessment.  If patient discharges after hours, please call [phone].   Sedalia Muta 02/05/2012, 12:02 PM

## 2012-02-05 NOTE — Progress Notes (Signed)
Patient ID: [name], male   DOB: 1950/05/23, 62 y.o.   MRN: [id number] . Advanced Heart Failure Rounding Note   Subjective:    Mr. Beckstrand is a 62 y.o. gentlemen with multiple hospital admissions for HF exacerbations. 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 He also has history of ventricular tachycardia s/p St Jude ICD by [name], chronic kidney disease: stage III, baseline Cr ~2, and paroxysmal atrial arrhythmias controlled by amiodarone. As well as sarcoidosis, hypertension. Cath in 2008 by Dr. [name] showed no CAD. Dry weight is 219 (11/2011).   Primary cardiologist [name]. Admitted with NYHA IIIB/IV HF with low out put symptoms. RHC performed with low output. Milrinone post cath.   RHC 01/26/12 RA = 14  RV = 51/16/15  PA = 57/34 (43)  PCW = 26  Fick cardiac output/index = 4.2/1.8  Thermo CO/CI = 3.9/1.7  PVR = 4.0 Woods  O2 sat = 97%  PA sat = 57%, 58%  Ao Pressure (non-invasive): 116/80 (93)  SVR = 1504    CVP this morning is about 7.  PICC placed. Feels much better on milrinone.    Objective:   Weight Range:  Vital Signs:   Temp:  [97.6 F (36.4 C)-98.8 F (37.1 C)] 98.1 F (36.7 C) (02/03 0745) Pulse Rate:  [86-98] 98  (02/03 0659) Resp:  [18-19] 19  (02/02 1954) BP: (91-116)/(64-86) 105/86 mmHg (02/03 0700) SpO2:  [94 %-98 %] 95 % (02/03 0745) Weight:  [99.5 kg (219 lb 5.7 oz)] 99.5 kg (219 lb 5.7 oz) (02/03 0500) Last BM Date: 02/03/12  Weight change: Filed Weights   02/03/12 0500 02/04/12 0300 02/05/12 0500  Weight: 99.882 kg (220 lb 3.2 oz) 99.3 kg (218 lb 14.7 oz) 99.5 kg (219 lb 5.7 oz)    Intake/Output:   Intake/Output Summary (Last 24 hours) at 02/05/12 0804 Last data filed at 02/05/12 0745  Gross per 24 hour  Intake  772.5 ml  Output   2375 ml  Net -1602.5 ml     Physical Exam: CVP 7 General: Well appearing. No resp  difficulty  HEENT: normal  Neck: supple. JVP7. Carotids 2+ bilat; no bruits. No lymphadenopathy or thryomegaly appreciated. R IJ swan sleeve Cor: PMI nondisplaced. Regular rate & rhythm. 3/16m MR. +S3  Lungs: clear 2 liters  Abdomen: soft, nontender, obese, + distention. Palpable liver edge. No bruits or masses. Good bowel sounds.  Extremities: no cyanosis, clubbing, rash, no lower extremity edema  Neuro: alert & orientedx3, cranial nerves grossly intact. moves all 4 extremities w/o difficulty. Affect pleasant     Telemetry: A Fib 98  Labs: Basic Metabolic Panel:  Lab 02/05/12 6295 02/04/12 0350 02/03/12 0500 02/02/12 0430 02/01/12 0355  NA 134* 135 134* 132* 134*  K 3.3* 3.3* 4.0 4.1 4.0  CL 100 98 99 99 98  CO2 25 25 26 24 26   GLUCOSE 153* 138* 98 82 101*  BUN 38* 43* 33* 30* 29*  CREATININE 1.96* 2.52* 2.00* 1.91* 2.03*  CALCIUM 9.4 9.7 9.9 -- --  MG -- -- -- -- --  PHOS -- -- -- -- --    Liver Function Tests: No results found for this basename: AST:5,ALT:5,ALKPHOS:5,BILITOT:5,PROT:5,ALBUMIN:5 in the last 168 hours No results found for this basename: LIPASE:5,AMYLASE:5 in the last 168 hours No results found for this basename: AMMONIA:3 in the last 168 hours  CBC:  Lab 02/05/12 0500 02/04/12 0350 02/03/12  0500 02/02/12 0430 02/01/12 0355  WBC 4.3 4.8 5.9 6.0 5.5  NEUTROABS -- -- -- -- --  HGB 13.3 12.5* 13.0 12.9* 13.3  HCT 39.8 37.5* 38.2* 38.1* 40.3  MCV 86.9 86.8 86.8 85.8 85.6  PLT 229 224 235 202 197    Cardiac Enzymes: No results found for this basename: CKTOTAL:5,CKMB:5,CKMBINDEX:5,TROPONINI:5 in the last 168 hours  BNP: BNP (last 3 results)  Basename 02/03/12 0500 01/30/12 0326 01/27/12 0425  PROBNP 3722.0* 2314.0* 2775.0*     Other results:     Imaging: Dg Chest Port 1 View  02/03/2012  *RADIOLOGY REPORT*  Clinical Data: Confirm PICC line placement  PORTABLE CHEST - 1 VIEW  Comparison: 01/25/2012; 01/20/2012  Findings:  Grossly unchanged enlarged  cardiac silhouette mediastinal contours. Interval placement right upper extremity approach PICC line with tip projected over the superior cavoatrial junction.  Otherwise, stable position of support apparatus.  Mild pulmonary venous congestion without frank evidence of edema. Minimal improved aeration of the bilateral lung bases with persistent left basilar/retrocardiac opacity.  No new focal airspace opacity.  No definite pleural effusion or pneumothorax.  Unchanged bones of the lower cervical ACDF, incompletely imaged.  IMPRESSION: 1.  Right upper extremity approach PICC line tip projects of the superior cavoatrial junction. 2.  Pulmonary venous congestion with improved basilar atelectasis.   Original Report Authenticated By: Tacey Ruiz, MD      Medications:     Scheduled Medications:    . allopurinol  100 mg Oral Daily  . amiodarone  200 mg Oral Daily  . atorvastatin  20 mg Oral q1800  . carvedilol  6.25 mg Oral BID WC  . furosemide  80 mg Oral BID  . hydrALAZINE  75 mg Oral TID  . hydrocortisone cream   Topical TID  . isosorbide mononitrate  30 mg Oral Daily  . pantoprazole  40 mg Oral Daily  . polyethylene glycol  17 g Oral BID  . sodium chloride  10-40 mL Intracatheter Q12H  . Warfarin - Pharmacist Dosing Inpatient   Does not apply q1800    Infusions:    . sodium chloride 10 mL (02/03/12 2148)  . milrinone 0.25 mcg/kg/min (02/05/12 0053)    PRN Medications: acetaminophen, ALPRAZolam, alum & mag hydroxide-simeth, HYDROmorphone (DILAUDID) injection, oxyCODONE-acetaminophen, sodium chloride, zolpidem   Assessment:  1. A/C systolic heart failure EF 20%  --with multiple readmissions  2. NICM  3. Chronic renal failure  4. H/o VT s/p ICD  5. 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 Chronic AF 7. NSVT 8. Gout    Plan/Discussion:    He failed milrinone wean and will need home inotropes. PICC is in place. Milrinone form filled out. He should be ready to go  home today.. Renal function improving.   He has been very compliant in house and I suspect he might do well with a VAD if we can keep renal function stable.   Will need routine drug screens based on previous history.   No spironolactone/Ace due to renal failure.   AF is rate controlled.  Coumadin per pharmacy.  Stop amio. Can titrate Coreg a bit given chronic atrial fibrillation.   Eimy Plaza,MD 8:04 AM

## 2012-02-05 NOTE — Progress Notes (Signed)
ANTICOAGULATION CONSULT NOTE - Follow Up Consult  Pharmacy Consult for Coumadin Indication: atrial fibrillation  Allergies  Allergen Reactions  . Ativan (Lorazepam) Other (See Comments)    Didn't like the way it made him feel; hallucinations, talking to himself    Patient Measurements: Height: 6\' 3"  (190.5 cm) Weight: 219 lb 5.7 oz (99.5 kg) IBW/kg (Calculated) : 84.5   Vital Signs: Temp: 98.1 F (36.7 C) (02/03 0745) Temp src: Oral (02/03 0745) BP: 105/86 mmHg (02/03 0700) Pulse Rate: 98  (02/03 0659)  Labs:  Basename 02/05/12 0500 02/04/12 0350 02/03/12 0500  HGB 13.3 12.5* --  HCT 39.8 37.5* 38.2*  PLT 229 224 235  APTT -- -- --  LABPROT 24.7* 30.2* 33.1*  INR 2.35* 3.09* 3.50*  HEPARINUNFRC -- -- --  CREATININE 1.96* 2.52* 2.00*  CKTOTAL -- -- --  CKMB -- -- --  TROPONINI -- -- --    Estimated Creatinine Clearance: 46.7 ml/min (by C-G formula based on Cr of 1.96).   Medications:  Scheduled:     . allopurinol  100 mg Oral Daily  . amiodarone  200 mg Oral Daily  . atorvastatin  20 mg Oral q1800  . carvedilol  6.25 mg Oral BID WC  . furosemide  80 mg Oral BID  . hydrALAZINE  75 mg Oral TID  . hydrocortisone cream   Topical TID  . isosorbide mononitrate  30 mg Oral Daily  . pantoprazole  40 mg Oral Daily  . polyethylene glycol  17 g Oral BID  . [COMPLETED] potassium chloride  40 mEq Oral Once  . sodium chloride  10-40 mL Intracatheter Q12H  . Warfarin - Pharmacist Dosing Inpatient   Does not apply q1800  . [DISCONTINUED] potassium chloride  40 mEq Oral Once   Infusions:     . sodium chloride 10 mL (02/03/12 2148)  . milrinone 0.25 mcg/kg/min (02/05/12 0053)    Assessment: 62yom continues on coumadin for afib. INR increased quickly this admission but is now at goal. CBC stable. No bleeding reported. Plan to stop amiodarone and rate control, will need close INR follow up after discharge as this may affect INR.  Goal of Therapy:  INR 2-3 Monitor  platelets by anticoagulation protocol: Yes   Plan:  1) Warfarin 10 mg daily 2) Follow up INR in AM  Thank you for allowing pharmacy to be a part of this patients care team.  Lovenia Kim Pharm.D., BCPS Clinical Pharmacist 02/05/2012 7:57 AM Pager: [phone] Phone: [phone]

## 2012-02-06 ENCOUNTER — Encounter (HOSPITAL_COMMUNITY): Payer: Self-pay

## 2012-02-06 DIAGNOSIS — Z029 Encounter for administrative examinations, unspecified: Secondary | ICD-10-CM

## 2012-02-08 ENCOUNTER — Ambulatory Visit (INDEPENDENT_AMBULATORY_CARE_PROVIDER_SITE_OTHER): Payer: PRIVATE HEALTH INSURANCE | Admitting: *Deleted

## 2012-02-08 DIAGNOSIS — I4891 Unspecified atrial fibrillation: Secondary | ICD-10-CM

## 2012-02-08 DIAGNOSIS — I5023 Acute on chronic systolic (congestive) heart failure: Secondary | ICD-10-CM

## 2012-02-08 DIAGNOSIS — I509 Heart failure, unspecified: Secondary | ICD-10-CM

## 2012-02-08 LAB — POCT INR: INR: 1.3

## 2012-02-09 ENCOUNTER — Ambulatory Visit (HOSPITAL_COMMUNITY)
Admit: 2012-02-09 | Discharge: 2012-02-09 | Disposition: A | Discharge status: Home / Self Care | Payer: PRIVATE HEALTH INSURANCE | Attending: Internal Medicine | Admitting: Internal Medicine

## 2012-02-09 ENCOUNTER — Encounter (HOSPITAL_COMMUNITY): Payer: Self-pay | Admitting: *Deleted

## 2012-02-09 VITALS — BP 114/76 | HR 103 | Wt 221.5 lb

## 2012-02-09 DIAGNOSIS — I5022 Chronic systolic (congestive) heart failure: Secondary | ICD-10-CM | POA: Insufficient documentation

## 2012-02-09 DIAGNOSIS — I4891 Unspecified atrial fibrillation: Secondary | ICD-10-CM | POA: Insufficient documentation

## 2012-02-09 LAB — RAPID URINE DRUG SCREEN, HOSP PERFORMED
Amphetamines: NOT DETECTED
Benzodiazepines: NOT DETECTED
Cocaine: NOT DETECTED

## 2012-02-09 MED ORDER — AMIODARONE HCL 200 MG PO TABS: 200.0000 mg | ORAL_TABLET | Freq: Two times a day (BID) | ORAL | Status: DC

## 2012-02-09 MED ORDER — APIXABAN 5 MG PO TABS: 5.0000 mg | ORAL_TABLET | Freq: Two times a day (BID) | ORAL | Status: DC

## 2012-02-09 NOTE — Assessment & Plan Note (Addendum)
INR < 2 . Coumadin adjusted yesterday. Given he remains in A fib. Will need TEE and DC-CV next week. Increase Amiodarone to 200 mg twice a day. Stop coumadin and start Elliquis 5 mg twice a day.   Attending: He tells me he has been in AF at least a year. He thinks it makes him feel worse. Given low output HF, I think it is reasonable to try to get him back in NSR and see if it helps. Will switch to Eliquis and increase amio. Schedule TEE/DC-CV next week.

## 2012-02-09 NOTE — Progress Notes (Signed)
Patient ID: [name], male   DOB: 1950/01/07, 62 y.o.   MRN: [id number]  Weight Range   Baseline proBNP     HPI: [name] is a 61 y.o. gentlemen with multiple hospital admissions for HF exacerbations. 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 He also has history of ventricular tachycardia s/p St Jude ICD by [name], chronic kidney disease: stage III, baseline Cr ~2, and paroxysmal atrial arrhythmias controlled by amiodarone. As well as sarcoidosis, hypertension. Cath in 2008 by Dr. [name] showed no CAD. Dry weight is 219 (11/2011).   Primary cardiologist [name]. Admitted with NYHA IIIB/IV HF with low out put symptoms. RHC performed with low output. Milrinone post cath. Discharged form [facility] 02/05/12 on Milrionone at 0.25 mcg via PICC.  Discharge weight 219  pounds.   RHC 01/26/12  RA = 14  RV = 51/16/15  PA = 57/34 (43)  PCW = 26  Fick cardiac output/index = 4.2/1.8  Thermo CO/CI = 3.9/1.7  PVR = 4.0 Woods  O2 sat = 97%  PA sat = 57%, 58%  Ao Pressure (non-invasive): 116/80 (93)  SVR = 1504   He returns for post hospital follow up. Complains of fatigue. Breathing better but he remains SOB completing ADLs. Denies dizziness/PND/CP. Weight at home 214-215. He has taken extra lasix 2 days. Lives at home with Mother. He continues on Milrinone at 0.25 mcg via PICC. Followed by [facility] for PICC and weekly labs. Coumadin management by [facility].     ROS: All systems negative except as listed in HPI, PMH and Problem List.  Past Medical History  Diagnosis Date  . CHF (congestive heart failure)   . Sarcoidosis   . Cardiomyopathy, dilated, nonischemic     non ischemic by cath  . Acute on chronic systolic heart failure   . Automatic implantable cardiac defibrillator in situ   . Atrial fibrillation   . NSVT (nonsustained ventricular tachycardia)   . GERD  (gastroesophageal reflux disease)   . Hypercholesteremia   . Myocardial infarction   . Shortness of breath   . Chronic kidney disease (CKD), stage III (moderate)   . Pacemaker   . Anginal pain   . Gout   . Hypertension     [name]  . Coronary artery disease     Current Outpatient Prescriptions  Medication Sig Dispense Refill  . allopurinol (ZYLOPRIM) 100 MG tablet Take 1 tablet (100 mg total) by mouth daily.  30 tablet  6  . carvedilol (COREG) 3.125 MG tablet Take 2 tablets (6.25 mg total) by mouth 2 (two) times daily.  60 tablet  3  . furosemide (LASIX) 80 MG tablet Take 1 tablet (80 mg total) by mouth 2 (two) times daily.      . isosorbide-hydrALAZINE (BIDIL) 20-37.5 MG per tablet Take 1 tablet by mouth 3 (three) times daily.  30 tablet  6  . LORazepam (ATIVAN) 1 MG tablet Take 0.5 tablets (0.5 mg total) by mouth 2 (two) times daily as needed for anxiety.  15 tablet  0  . milrinone (PRIMACOR) 20 MG/100ML SOLN infusion Inject 24.875 mcg/min into the vein continuous.  100 mL    . ondansetron (ZOFRAN-ODT) 8 MG disintegrating tablet Take 8 mg by mouth every 8 (eight) hours as needed. 8mg  ODT q4 hours prn nausea      . oxyCODONE-acetaminophen (PERCOCET/ROXICET) 5-325 MG per tablet Take 1 tablet by mouth  every 4 (four) hours as needed. For pain.      . pantoprazole (PROTONIX) 40 MG tablet Take 40 mg by mouth daily.      . polyethylene glycol powder (GLYCOLAX/MIRALAX) powder Take 17 g by mouth 2 (two) times daily. Until daily soft stools  OTC  255 g  0  . rosuvastatin (CRESTOR) 5 MG tablet Take 1 tablet (5 mg total) by mouth at bedtime.      Marland Kitchen spironolactone (ALDACTONE) 25 MG tablet Take 0.5 tablets (12.5 mg total) by mouth daily.  15 tablet  6  . warfarin (COUMADIN) 10 MG tablet Take 10 mg by mouth every evening.          PHYSICAL EXAM: Filed Vitals:   02/09/12 1111  BP: 114/76  Pulse: 103  Weight: 221 lb 8 oz (100.472 kg)  SpO2: 97%    General: Well appearing. No resp  difficulty  HEENT: normal  Neck: supple. JVP 5-6. Carotids 2+ bilat; no bruits. No lymphadenopathy or thryomegaly appreciated.  Cor: PMI nondisplaced. Irregular rate & rhythm. 3/71m MR. +S3  Lungs: clear 2 liters  Abdomen: soft, nontender, obese, . Palpable liver edge. No bruits or masses. Good bowel sounds.  Extremities: no cyanosis, clubbing, rash, no lower extremity edema RUE PICC Neuro: alert & orientedx3, cranial nerves grossly intact. moves all 4 extremities w/o difficulty. Affect pleasant     ASSESSMENT & PLAN:

## 2012-02-09 NOTE — Patient Instructions (Addendum)
Plan for TEE / DCCV 02/16/12   Stop Coumadin  Take Amiodarone 200 mg twice a day  Take Eliquis 5 mg twice a day  Do the following things EVERYDAY: 1) Weigh yourself in the morning before breakfast. Write it down and keep it in a log. 2) Take your medicines as prescribed 3) Eat low salt foods-Limit salt (sodium) to 2000 mg per day.  4) Stay as active as you can everyday 5) Limit all fluids for the day to less than 2 liters

## 2012-02-09 NOTE — Assessment & Plan Note (Addendum)
NYHA IIIB despite Milrionone at 0.25 mcg via PICC. Remains short of breath with ADLs. Volume status stable. May need to pursue LVAD at some point but renal funciton is an issue. [facility] to check labs and fax results next week. Check Urine drug screen. Follow up in 3 weeks.   Patient seen and examined with Tonye Becket, NP. We discussed all aspects of the encounter. I agree with the assessment and plan as stated above. He is mildly improved with milrinone but still NYHA IIIB. Volume status looks good. Extensive discussion about possible need for LVAD support and what it would take on his part (particularly with regard to family support). We provided him with educational material and will bring him back in a week or two to further discuss. In the interim we will plan on TEE/DC-CV of his AF to see if this will help his HF.   Will not change meds at this point.

## 2012-02-12 ENCOUNTER — Other Ambulatory Visit (HOSPITAL_COMMUNITY): Payer: Self-pay | Admitting: *Deleted

## 2012-02-15 ENCOUNTER — Telehealth: Payer: Self-pay | Admitting: Physician Assistant

## 2012-02-15 NOTE — Telephone Encounter (Signed)
Our office is closed and coordinator is out due to snow. Received call from [place] with endoscopy. Per her report, the wrong [name] is pre-admitted for TEE tomorrow (the patient's father). When the patient arrives for his procedure tomorrow, she believes that registration will be able to re-direct the pre-admission to the correct chart. Dayna Dunn PA-C

## 2012-02-16 ENCOUNTER — Encounter (HOSPITAL_COMMUNITY)
Admission: RE | Disposition: A | Discharge status: Home / Self Care | Payer: Self-pay | Source: Ambulatory Visit | Attending: Internal Medicine

## 2012-02-16 ENCOUNTER — Other Ambulatory Visit (HOSPITAL_COMMUNITY): Payer: Self-pay | Admitting: Adult Health

## 2012-02-16 ENCOUNTER — Ambulatory Visit (HOSPITAL_COMMUNITY)
Admission: RE | Admit: 2012-02-16 | Discharge: 2012-02-16 | Disposition: A | Discharge status: Home / Self Care | Payer: PRIVATE HEALTH INSURANCE | Source: Ambulatory Visit | Attending: Internal Medicine | Admitting: Internal Medicine

## 2012-02-16 ENCOUNTER — Encounter (HOSPITAL_COMMUNITY): Payer: Self-pay | Admitting: *Deleted

## 2012-02-16 DIAGNOSIS — I4891 Unspecified atrial fibrillation: Secondary | ICD-10-CM

## 2012-02-16 DIAGNOSIS — I359 Nonrheumatic aortic valve disorder, unspecified: Secondary | ICD-10-CM | POA: Insufficient documentation

## 2012-02-16 DIAGNOSIS — I079 Rheumatic tricuspid valve disease, unspecified: Secondary | ICD-10-CM | POA: Insufficient documentation

## 2012-02-16 HISTORY — PX: TEE WITHOUT CARDIOVERSION: SHX5443

## 2012-02-16 SURGERY — ECHOCARDIOGRAM, TRANSESOPHAGEAL
Anesthesia: Moderate Sedation

## 2012-02-16 MED ORDER — FENTANYL CITRATE 0.05 MG/ML IJ SOLN
INTRAMUSCULAR | Status: DC | PRN
  Administered 2012-02-16 (×3): 25 ug via INTRAVENOUS

## 2012-02-16 MED ORDER — BUTAMBEN-TETRACAINE-BENZOCAINE 2-2-14 % EX AERO
INHALATION_SPRAY | CUTANEOUS | Status: DC | PRN
  Administered 2012-02-16: 2 via TOPICAL

## 2012-02-16 MED ORDER — SODIUM CHLORIDE 0.9 % IV SOLN
INTRAVENOUS | Status: DC
  Administered 2012-02-16: 500 mL via INTRAVENOUS

## 2012-02-16 MED ORDER — FENTANYL CITRATE 0.05 MG/ML IJ SOLN
INTRAMUSCULAR | Status: AC
  Filled 2012-02-16: qty 2

## 2012-02-16 MED ORDER — MIDAZOLAM HCL 10 MG/2ML IJ SOLN
INTRAMUSCULAR | Status: DC | PRN
  Administered 2012-02-16 (×4): 2 mg via INTRAVENOUS

## 2012-02-16 MED ORDER — MIDAZOLAM HCL 5 MG/ML IJ SOLN
INTRAMUSCULAR | Status: AC
  Filled 2012-02-16: qty 2

## 2012-02-16 MED ORDER — HEPARIN SOD (PORK) LOCK FLUSH 100 UNIT/ML IV SOLN
500.0000 [IU] | Freq: Once | INTRAVENOUS | Status: AC
  Administered 2012-02-16: 500 [IU] via INTRAVENOUS

## 2012-02-16 NOTE — H&P (View-Only) (Signed)
Patient ID: [name], male   DOB: 02/20/1950, 62 y.o.   MRN: [id number]  Weight Range   Baseline proBNP     HPI: [name] is a 62 y.o. gentlemen with multiple hospital admissions for HF exacerbations. 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 He also has history of ventricular tachycardia s/p St Jude ICD by Dr. Taylor, chronic kidney disease: stage III, baseline Cr ~2, and paroxysmal atrial arrhythmias controlled by amiodarone. As well as sarcoidosis, hypertension. Cath in 2008 by Dr. Harwani showed no CAD. Dry weight is 219 (11/2011).   Primary cardiologist Dr Harwani. Admitted with NYHA IIIB/IV HF with low out put symptoms. RHC performed with low output. Milrinone post cath. Discharged form [facility] 02/05/12 on Milrionone at 0.25 mcg via PICC.  Discharge weight 219  pounds.   RHC 01/26/12  RA = 14  RV = 51/16/15  PA = 57/34 (43)  PCW = 26  Fick cardiac output/index = 4.2/1.8  Thermo CO/CI = 3.9/1.7  PVR = 4.0 Woods  O2 sat = 97%  PA sat = 57%, 58%  Ao Pressure (non-invasive): 116/80 (93)  SVR = 1504   He returns for post hospital follow up. Complains of fatigue. Breathing better but he remains SOB completing ADLs. Denies dizziness/PND/CP. Weight at home 214-215. He has taken extra lasix 2 days. Lives at home with Mother. He continues on Milrinone at 0.25 mcg via PICC. Followed by [facility] for PICC and weekly labs. Coumadin management by [facility].     ROS: All systems negative except as listed in HPI, PMH and Problem List.  Past Medical History  Diagnosis Date  . CHF (congestive heart failure)   . Sarcoidosis   . Cardiomyopathy, dilated, nonischemic     non ischemic by cath  . Acute on chronic systolic heart failure   . Automatic implantable cardiac defibrillator in situ   . Atrial fibrillation   . NSVT (nonsustained ventricular tachycardia)   . GERD  (gastroesophageal reflux disease)   . Hypercholesteremia   . Myocardial infarction   . Shortness of breath   . Chronic kidney disease (CKD), stage III (moderate)   . Pacemaker   . Anginal pain   . Gout   . Hypertension     dr harwarnia  . Coronary artery disease     Current Outpatient Prescriptions  Medication Sig Dispense Refill  . allopurinol (ZYLOPRIM) 100 MG tablet Take 1 tablet (100 mg total) by mouth daily.  30 tablet  6  . carvedilol (COREG) 3.125 MG tablet Take 2 tablets (6.25 mg total) by mouth 2 (two) times daily.  60 tablet  3  . furosemide (LASIX) 80 MG tablet Take 1 tablet (80 mg total) by mouth 2 (two) times daily.      . isosorbide-hydrALAZINE (BIDIL) 20-37.5 MG per tablet Take 1 tablet by mouth 3 (three) times daily.  30 tablet  6  . LORazepam (ATIVAN) 1 MG tablet Take 0.5 tablets (0.5 mg total) by mouth 2 (two) times daily as needed for anxiety.  15 tablet  0  . milrinone (PRIMACOR) 20 MG/100ML SOLN infusion Inject 24.875 mcg/min into the vein continuous.  100 mL    . ondansetron (ZOFRAN-ODT) 8 MG disintegrating tablet Take 8 mg by mouth every 8 (eight) hours as needed. 8mg ODT q4 hours prn nausea      . oxyCODONE-acetaminophen (PERCOCET/ROXICET) 5-325 MG per tablet Take 1 tablet by mouth   every 4 (four) hours as needed. For pain.      . pantoprazole (PROTONIX) 40 MG tablet Take 40 mg by mouth daily.      . polyethylene glycol powder (GLYCOLAX/MIRALAX) powder Take 17 g by mouth 2 (two) times daily. Until daily soft stools  OTC  255 g  0  . rosuvastatin (CRESTOR) 5 MG tablet Take 1 tablet (5 mg total) by mouth at bedtime.      . spironolactone (ALDACTONE) 25 MG tablet Take 0.5 tablets (12.5 mg total) by mouth daily.  15 tablet  6  . warfarin (COUMADIN) 10 MG tablet Take 10 mg by mouth every evening.          PHYSICAL EXAM: Filed Vitals:   02/09/12 1111  BP: 114/76  Pulse: 103  Weight: 221 lb 8 oz (100.472 kg)  SpO2: 97%    General: Well appearing. No resp  difficulty  HEENT: normal  Neck: supple. JVP 5-6. Carotids 2+ bilat; no bruits. No lymphadenopathy or thryomegaly appreciated.  Cor: PMI nondisplaced. Irregular rate & rhythm. 3/6m MR. +S3  Lungs: clear 2 liters  Abdomen: soft, nontender, obese, . Palpable liver edge. No bruits or masses. Good bowel sounds.  Extremities: no cyanosis, clubbing, rash, no lower extremity edema RUE PICC Neuro: alert & orientedx3, cranial nerves grossly intact. moves all 4 extremities w/o difficulty. Affect pleasant     ASSESSMENT & PLAN:  
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
## 2012-02-16 NOTE — Interval H&P Note (Signed)
History and Physical Interval Note:  02/16/2012 2:19 PM  [name]  has presented today for surgery, with the diagnosis of afib  The various methods of treatment have been discussed with the patient and family. After consideration of risks, benefits and other options for treatment, the patient has consented to  Procedure(s) with comments: TRANSESOPHAGEAL ECHOCARDIOGRAM (TEE) (N/A) - original case scheduled under his dad (who is deceased), rescheduled under correct mrn/pt/dob. [place]/dl CARDIOVERSION (N/A) as a surgical intervention .  The patient's history has been reviewed, patient examined, no change in status, stable for surgery.  I have reviewed the patient's chart and labs.  Questions were answered to the patient's satisfaction.     Daniel Bensimhon

## 2012-02-16 NOTE — Progress Notes (Signed)
  Echocardiogram Echocardiogram Transesophageal has been performed.  Georgian Co 02/16/2012, 3:54 PM

## 2012-02-17 ENCOUNTER — Encounter (HOSPITAL_COMMUNITY): Payer: Self-pay | Admitting: Internal Medicine

## 2012-02-18 ENCOUNTER — Observation Stay (HOSPITAL_COMMUNITY)
Admission: EM | Admit: 2012-02-18 | Discharge: 2012-02-21 | Disposition: A | Discharge status: Home / Self Care | Payer: PRIVATE HEALTH INSURANCE | Attending: Internal Medicine | Admitting: Internal Medicine

## 2012-02-18 ENCOUNTER — Emergency Department (HOSPITAL_COMMUNITY): Payer: PRIVATE HEALTH INSURANCE

## 2012-02-18 ENCOUNTER — Encounter (HOSPITAL_COMMUNITY): Payer: Self-pay | Admitting: Emergency Medicine

## 2012-02-18 DIAGNOSIS — I5023 Acute on chronic systolic (congestive) heart failure: Secondary | ICD-10-CM

## 2012-02-18 DIAGNOSIS — I509 Heart failure, unspecified: Secondary | ICD-10-CM | POA: Insufficient documentation

## 2012-02-18 DIAGNOSIS — K219 Gastro-esophageal reflux disease without esophagitis: Secondary | ICD-10-CM | POA: Insufficient documentation

## 2012-02-18 DIAGNOSIS — I2510 Atherosclerotic heart disease of native coronary artery without angina pectoris: Secondary | ICD-10-CM | POA: Insufficient documentation

## 2012-02-18 DIAGNOSIS — R112 Nausea with vomiting, unspecified: Secondary | ICD-10-CM | POA: Insufficient documentation

## 2012-02-18 DIAGNOSIS — N183 Chronic kidney disease, stage 3 unspecified: Secondary | ICD-10-CM | POA: Insufficient documentation

## 2012-02-18 DIAGNOSIS — N182 Chronic kidney disease, stage 2 (mild): Secondary | ICD-10-CM | POA: Diagnosis present

## 2012-02-18 DIAGNOSIS — I129 Hypertensive chronic kidney disease with stage 1 through stage 4 chronic kidney disease, or unspecified chronic kidney disease: Secondary | ICD-10-CM | POA: Insufficient documentation

## 2012-02-18 DIAGNOSIS — I420 Dilated cardiomyopathy: Secondary | ICD-10-CM | POA: Diagnosis present

## 2012-02-18 DIAGNOSIS — R0602 Shortness of breath: Secondary | ICD-10-CM | POA: Insufficient documentation

## 2012-02-18 DIAGNOSIS — I5022 Chronic systolic (congestive) heart failure: Secondary | ICD-10-CM | POA: Insufficient documentation

## 2012-02-18 DIAGNOSIS — I4891 Unspecified atrial fibrillation: Secondary | ICD-10-CM | POA: Insufficient documentation

## 2012-02-18 DIAGNOSIS — I429 Cardiomyopathy, unspecified: Secondary | ICD-10-CM

## 2012-02-18 DIAGNOSIS — I428 Other cardiomyopathies: Secondary | ICD-10-CM | POA: Insufficient documentation

## 2012-02-18 DIAGNOSIS — Z79899 Other long term (current) drug therapy: Secondary | ICD-10-CM | POA: Insufficient documentation

## 2012-02-18 DIAGNOSIS — E7800 Pure hypercholesterolemia, unspecified: Secondary | ICD-10-CM | POA: Insufficient documentation

## 2012-02-18 DIAGNOSIS — Z95810 Presence of automatic (implantable) cardiac defibrillator: Secondary | ICD-10-CM | POA: Insufficient documentation

## 2012-02-18 DIAGNOSIS — D869 Sarcoidosis, unspecified: Secondary | ICD-10-CM | POA: Insufficient documentation

## 2012-02-18 DIAGNOSIS — R079 Chest pain, unspecified: Principal | ICD-10-CM | POA: Insufficient documentation

## 2012-02-18 LAB — HEPATIC FUNCTION PANEL
AST: 32 U/L (ref 0–37)
Albumin: 3.1 g/dL — ABNORMAL LOW (ref 3.5–5.2)
Total Bilirubin: 0.5 mg/dL (ref 0.3–1.2)

## 2012-02-18 LAB — POCT I-STAT, CHEM 8
Calcium, Ion: 1.29 mmol/L (ref 1.13–1.30)
Glucose, Bld: 87 mg/dL (ref 70–99)
HCT: 41 % (ref 39.0–52.0)
Hemoglobin: 13.9 g/dL (ref 13.0–17.0)

## 2012-02-18 LAB — CBC WITH DIFFERENTIAL/PLATELET
Basophils Relative: 1 % (ref 0–1)
Eosinophils Absolute: 0.3 10*3/uL (ref 0.0–0.7)
Eosinophils Relative: 5 % (ref 0–5)
MCV: 88 fL (ref 78.0–100.0)
Monocytes Absolute: 0.5 10*3/uL (ref 0.1–1.0)
Monocytes Relative: 8 % (ref 3–12)
Neutrophils Relative %: 46 % (ref 43–77)
Platelets: 262 10*3/uL (ref 150–400)
RBC: 4.57 MIL/uL (ref 4.22–5.81)
RDW: 14.2 % (ref 11.5–15.5)
WBC: 6 10*3/uL (ref 4.0–10.5)

## 2012-02-18 LAB — LIPASE, BLOOD: Lipase: 46 U/L (ref 11–59)

## 2012-02-18 MED ORDER — MORPHINE SULFATE 4 MG/ML IJ SOLN
4.0000 mg | Freq: Once | INTRAMUSCULAR | Status: AC
  Administered 2012-02-18: 4 mg via INTRAVENOUS
  Filled 2012-02-18: qty 1

## 2012-02-18 MED ORDER — ONDANSETRON HCL 4 MG PO TABS: 4.0000 mg | ORAL_TABLET | Freq: Four times a day (QID) | ORAL | Status: DC | PRN

## 2012-02-18 MED ORDER — LORAZEPAM 0.5 MG PO TABS
0.5000 mg | ORAL_TABLET | Freq: Two times a day (BID) | ORAL | Status: DC | PRN
  Administered 2012-02-19: 0.5 mg via ORAL
  Filled 2012-02-18: qty 1

## 2012-02-18 MED ORDER — ISOSORB DINITRATE-HYDRALAZINE 20-37.5 MG PO TABS
1.0000 | ORAL_TABLET | Freq: Three times a day (TID) | ORAL | Status: DC
  Administered 2012-02-19 – 2012-02-20 (×5): 1 via ORAL
  Filled 2012-02-18 (×7): qty 1

## 2012-02-18 MED ORDER — CARVEDILOL 6.25 MG PO TABS
6.2500 mg | ORAL_TABLET | Freq: Two times a day (BID) | ORAL | Status: DC
  Administered 2012-02-19 – 2012-02-20 (×3): 6.25 mg via ORAL
  Filled 2012-02-18 (×6): qty 1

## 2012-02-18 MED ORDER — NITROGLYCERIN 0.4 MG SL SUBL: 0.4000 mg | SUBLINGUAL_TABLET | SUBLINGUAL | Status: DC | PRN

## 2012-02-18 MED ORDER — SPIRONOLACTONE 12.5 MG HALF TABLET
12.5000 mg | ORAL_TABLET | Freq: Every day | ORAL | Status: DC
  Administered 2012-02-19 – 2012-02-21 (×3): 12.5 mg via ORAL
  Filled 2012-02-18 (×3): qty 1

## 2012-02-18 MED ORDER — MORPHINE SULFATE 2 MG/ML IJ SOLN
1.0000 mg | INTRAMUSCULAR | Status: DC | PRN
  Administered 2012-02-19 (×2): 1 mg via INTRAVENOUS
  Filled 2012-02-18 (×2): qty 1

## 2012-02-18 MED ORDER — APIXABAN 5 MG PO TABS
5.0000 mg | ORAL_TABLET | Freq: Two times a day (BID) | ORAL | Status: DC
  Administered 2012-02-19 – 2012-02-21 (×6): 5 mg via ORAL
  Filled 2012-02-18 (×7): qty 1

## 2012-02-18 MED ORDER — ONDANSETRON HCL 4 MG/2ML IJ SOLN
4.0000 mg | Freq: Once | INTRAMUSCULAR | Status: AC
  Administered 2012-02-18: 4 mg via INTRAVENOUS
  Filled 2012-02-18: qty 2

## 2012-02-18 MED ORDER — ALLOPURINOL 100 MG PO TABS
100.0000 mg | ORAL_TABLET | Freq: Every day | ORAL | Status: DC
  Administered 2012-02-19 – 2012-02-21 (×3): 100 mg via ORAL
  Filled 2012-02-18 (×3): qty 1

## 2012-02-18 MED ORDER — ONDANSETRON HCL 4 MG/2ML IJ SOLN
4.0000 mg | Freq: Four times a day (QID) | INTRAMUSCULAR | Status: DC | PRN
  Administered 2012-02-21: 4 mg via INTRAVENOUS
  Filled 2012-02-18: qty 2

## 2012-02-18 MED ORDER — AMIODARONE HCL 200 MG PO TABS
200.0000 mg | ORAL_TABLET | Freq: Two times a day (BID) | ORAL | Status: DC
  Administered 2012-02-19 – 2012-02-21 (×6): 200 mg via ORAL
  Filled 2012-02-18 (×8): qty 1

## 2012-02-18 MED ORDER — ATORVASTATIN CALCIUM 10 MG PO TABS
10.0000 mg | ORAL_TABLET | Freq: Every day | ORAL | Status: DC
  Administered 2012-02-19 – 2012-02-21 (×3): 10 mg via ORAL
  Filled 2012-02-18 (×3): qty 1

## 2012-02-18 MED ORDER — SODIUM CHLORIDE 0.9 % IJ SOLN: 3.0000 mL | Freq: Two times a day (BID) | INTRAMUSCULAR | Status: DC

## 2012-02-18 MED ORDER — FUROSEMIDE 80 MG PO TABS
80.0000 mg | ORAL_TABLET | Freq: Two times a day (BID) | ORAL | Status: DC
  Administered 2012-02-19: 80 mg via ORAL
  Filled 2012-02-18 (×3): qty 1

## 2012-02-18 MED ORDER — MILRINONE IN DEXTROSE 20 MG/100ML IV SOLN
0.2500 ug/kg/min | INTRAVENOUS | Status: DC
  Administered 2012-02-19 – 2012-02-21 (×5): 0.25 ug/kg/min via INTRAVENOUS
  Filled 2012-02-18 (×5): qty 100

## 2012-02-18 MED ORDER — PANTOPRAZOLE SODIUM 40 MG PO TBEC
40.0000 mg | DELAYED_RELEASE_TABLET | Freq: Every day | ORAL | Status: DC
  Administered 2012-02-19 – 2012-02-21 (×3): 40 mg via ORAL
  Filled 2012-02-18 (×3): qty 1

## 2012-02-18 NOTE — ED Notes (Signed)
Patient is resting comfortably. 

## 2012-02-18 NOTE — H&P (Signed)
Hospital Admission Note Date: 02/18/2012  Patient name: [name] Medical record number: [id number] Date of birth: March 04, 1950 Age: 62 y.o. Gender: male PCP: Robynn Pane, MD  Medical Service: [facility]  Attending physician:  [name]    1st Contact: Dr. Virgina Organ   Pager: [phone] 2nd Contact: Dr. 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 [name] reports that on Friday night, he had an episode of dizziness, "needles in his chest,"and nausea. In addition to the needlelike sensation in his chest, he reports left neck pain that radiates into his left shoulder and left chest. He took Xanax because he felt that symptoms represented a panic attack, which she's had in the past. He reports that Xanax did not help his symptoms, and he began to feel like his heart was racing and then slowing down. He is concerned that his atrial fibrillation was worsening. He said he slept poorly overnight due to anxiety. The following day, Saturday, he had to repeat similar episodes. With these episodes, he also had emesis of stomach contents.  He denies any worsening of his baseline dyspnea or orthopnea. He denies any PND. He reports that he usually swells in his hands and abdominal wall when he is volume overloaded, but reports he has not noticed any increase in the swelling. [name] was most recently admitted to cardiology service here on 01-25-12 stage 3/4 NYHA heart failure and acute on chronic  renal failure. He had a right heart cath performed at that time notable for low cardiac output. He was diuresed with Lasix and discharged on milrinone. Since his hospital follow, he's had one office visit with Dr.Behnsimhon on 02/09/12. At this visit, his INR was subtherapeutic, Coumadin was discontinued, and he was started on apixiban for anticoagulation.  A TEE with possible cardioversion was scheduled for 02-16-12; however cardioversion was not completed during the procedure secondary to clot in left atrial appendage. [name] reports that after the procedure on Friday he was feeling "great, "until he learned of clot found on TEE. He said this made him very upset. He reports several nosebleeds over the past few days. He has had these in the past. Hemostasis achieved quickly with pressure. He has had a couple of days of cough, productive of clear sputum.  Denies syncope, fevers, chills, abdominal pain, diarrhea, constipation, hematochezia, dysuria, hematuria, rash, joint pain.  Meds: Current Outpatient Rx  Name  Route  Sig  Dispense  Refill  . allopurinol (ZYLOPRIM) 100 MG tablet   Oral   Take 1 tablet (100 mg total) by mouth daily.   30 tablet   6   . amiodarone (PACERONE) 200 MG tablet   Oral   Take 1 tablet (200 mg total) by mouth 2 (two) times daily.   60 tablet   6   . apixaban (ELIQUIS) 5 MG TABS tablet   Oral   Take 1 tablet (5 mg total) by mouth 2 (two) times daily.   60 tablet   3   . carvedilol (COREG) 3.125 MG tablet  Oral   Take 2 tablets (6.25 mg total) by mouth 2 (two) times daily.   60 tablet   3   . furosemide (LASIX) 80 MG tablet   Oral   Take 1 tablet (80 mg total) by mouth 2 (two) times daily.         . isosorbide-hydrALAZINE (BIDIL) 20-37.5 MG per tablet   Oral   Take 1 tablet by mouth 3 (three) times daily.   30 tablet   6   . LORazepam (ATIVAN) 1 MG tablet   Oral   Take 0.5 tablets (0.5 mg total) by mouth 2 (two) times daily as needed for  anxiety.   15 tablet   0   . milrinone (PRIMACOR) 20 MG/100ML SOLN infusion   Intravenous   Inject 24.875 mcg/min into the vein continuous.   100 mL      . ondansetron (ZOFRAN-ODT) 8 MG disintegrating tablet   Oral   Take 8 mg by mouth every 8 (eight) hours as needed. 8mg  ODT q4 hours prn nausea         . pantoprazole (PROTONIX) 40 MG tablet   Oral   Take 40 mg by mouth daily.         . polyethylene glycol powder (GLYCOLAX/MIRALAX) powder   Oral   Take 17 g by mouth 2 (two) times daily. Until daily soft stools  OTC   255 g   0   . rosuvastatin (CRESTOR) 5 MG tablet   Oral   Take 1 tablet (5 mg total) by mouth at bedtime.         Marland Kitchen spironolactone (ALDACTONE) 25 MG tablet   Oral   Take 0.5 tablets (12.5 mg total) by mouth daily.   15 tablet   6     Allergies: Allergies as of 02/18/2012  . (No Active Allergies)   Past Medical History  Diagnosis Date  . CHF (congestive heart failure)   . Sarcoidosis   . Cardiomyopathy, dilated, nonischemic     non ischemic by cath  . Acute on chronic systolic heart failure   . Automatic implantable cardiac defibrillator in situ   . Atrial fibrillation   . NSVT (nonsustained ventricular tachycardia)   . GERD (gastroesophageal reflux disease)   . Hypercholesteremia   . Myocardial infarction   . Shortness of breath   . Chronic kidney disease (CKD), stage III (moderate)   . Pacemaker   . Anginal pain   . Gout   . Hypertension     [name]  . Coronary artery disease    Past Surgical History  Procedure Laterality Date  . Back surgery  1987    Ruptured disk repair  . Pacemaker insertion      with ICD  . Tee without cardioversion  01/17/2011    Procedure: TRANSESOPHAGEAL ECHOCARDIOGRAM (TEE);  Surgeon: Ricki Rodriguez, MD;  Location[facility] ENDOSCOPY;  Service: Cardiovascular;  Laterality: N/A;  . Cardioversion  01/17/2011    Procedure: CARDIOVERSION;  Surgeon: Ricki Rodriguez, MD;  Location: [facility] ENDOSCOPY;  Service:  Cardiovascular;  Laterality: N/A;  . Cardiac catheterization    . Insert / replace / remove pacemaker    . Anterior cervical decomp/discectomy fusion  08/21/2011    Procedure: ANTERIOR CERVICAL DECOMPRESSION/DISCECTOMY FUSION 2 LEVELS;  Surgeon: Eldred Manges, MD;  Location: [facility] OR;  Service: Orthopedics;  Laterality: N/A;  C5-6, C6-7 Anterior Cervical Discectomy and Fusion, allograft, plate  . Tee without cardioversion N/A 02/16/2012    Procedure: TRANSESOPHAGEAL  ECHOCARDIOGRAM (TEE);  Surgeon: Dolores Patty, MD;  Location: [facility] ENDOSCOPY;  Service: Cardiovascular;  Laterality: N/A;  original case scheduled under his dad (who is deceased), rescheduled under correct mrn/pt/dob. [place]/dl   Family History  Problem Relation Age of Onset  . Heart disease    . Heart failure    . Stroke    . Anesthesia problems Neg Hx   . Hypotension Neg Hx   . Malignant hyperthermia Neg Hx   . Pseudochol deficiency Neg Hx    History   Social History  . Marital Status: Divorced    Spouse Name: N/A    Number of Children: N/A  . Years of Education: N/A   Occupational History  . Not on file.   Social History Main Topics  . Smoking status: Never Smoker   . Smokeless tobacco: Never Used  . Alcohol Use: No  . Drug Use: Yes    Special: "Crack" cocaine, Marijuana, Cocaine     Comment: last use Novemenber  . Sexually Active: Not Currently   Other Topics Concern  . Not on file   Social History Narrative  . No narrative on file    Review of Systems: 10 pt ROS performed, pertinent positives and negatives noted in HPI  Physical Exam: Blood pressure 114/77, pulse 101, temperature 98.4 F (36.9 C), temperature source Oral, resp. rate 24, SpO2 98.00%. O2 sats remained >95% after oxygen turned off and patient observed for 10-64min.  Vitals reviewed. General: resting in bed, NAD HEENT: PERRL, EOMI, no scleral icterus, MMM of OP Neck: TTP of L neck around from jaw line to clavicle, no massess Cardiac: HR  100-110, irregularly irregular, 2/6 apical systolic murmur Chest: ICD implantation site on L upper chest without erythema/induration/pain/warmth/ fluctuance Pulm: Normal work of breathing. CTAB. No rales appreciated. Abd: soft, nontender, nondistended, normoactive bowel sounds Ext: Full ROM of shoulder joints with no effusions. R PICC line insertion site with no erythema/induration/pain/warmth/ fluctuance. Extremities warm and well perfused, no pedal edema Neuro: alert and oriented X3, cranial nerves II-XII grossly intact, strength and sensation to light touch equal in bilateral upper and lower extremities  Lab results: Basic Metabolic Panel:  Recent Labs  40/98/11 1723  NA 144  K 3.5  CL 113*  GLUCOSE 87  BUN 31*  CREATININE 2.20*   CBC:  Recent Labs  02/18/12 1655 02/18/12 1723  WBC 6.0  --   NEUTROABS 2.8  --   HGB 13.4 13.9  HCT 40.2 41.0  MCV 88.0  --   PLT 262  --    Urine Drug Screen: Drugs of Abuse     Component Value Date/Time   LABOPIA NONE DETECTED 02/09/2012 1213   COCAINSCRNUR NONE DETECTED 02/09/2012 1213   LABBENZ NONE DETECTED 02/09/2012 1213   AMPHETMU NONE DETECTED 02/09/2012 1213   THCU NONE DETECTED 02/09/2012 1213   LABBARB NONE DETECTED 02/09/2012 1213    Imaging results:  Dg Chest 2 View  02/18/2012  *RADIOLOGY REPORT*  Clinical Data: Chest pain, shortness of breath, nausea, vomiting.  CHEST - 2 VIEW  Comparison: 02/03/2012  Findings: Left AICD and right PICC line remain in place, unchanged. Cardiomegaly.  No edema.  No focal opacities or effusions.  No acute bony abnormality.  IMPRESSION: Stable cardiomegaly.  No acute cardiopulmonary disease.   Original Report Authenticated By: Charlett Nose, M.D.     Other results: EKG: Atrial fibrillation with rate control (99), LAD, prolonged QTc ( ), incomplete LBBB. Compared to prior 01/28/12, no signficicant changes.  Assessment & Plan by Problem:  1) Chest pain Patient presents with 3 days of "needle-like"  chest pain, anxiety, and nausea.  He does report pain radiating from left shoulder as well as nausea, but no worsening of baseline dyspnea and no chest pressure. His EKG without ischemic changes and no change from prior. Symptoms are episodic, and appeared be provoked by anxiety. He's had similar symptoms with panic attacks in the past.  He does have NYHA class 3/4 heart failure with EF of 20% (see #3 below); however he has not noticed any worsening of his baseline dyspnea. He appears euvolemic without evidence of acute decompensated heart failure exacerbation this time. He does describe episodes of feeling his heart racing, and is concerned that his atrial fibrillation is out of control. On admission, he appears to be rate controlled with ventricular rate of 99 on EKG and 100-110 on the cardiac monitor at bedside. In terms of other etiologies for chest pain, PE is less likely this time. Geneva score is 5 (moderate risk) for tachycardia, but this is explained by his atrial fibrillation. Pain could be musculoskeletal in nature, and his neck pain is reproducible by palpation on physical exam. Other, less likely, considerations would be esophageal trauma during TEE. No evidence of esophageal perforation on his chest x-ray. Patient has not had hematemesis and has stable hemoglobin. Ultimately, his symptoms are most likely related to panic attack in the setting of atrial fibrillation. He may have had rapid ventricular rate at home which has resolved upon admission. At any rate, due to his multiple risk factors, will monitor him overnight on telemetry and cycle his enzymes to rule him out for ACS. - Admit to tele - Cycle CEx3 - Repeat EKG in am -Substitute atorvastatin for home rosuvastatin  2) Atrial fibrillation, rate controlled Patient with greater than one year history of atrial fibrillation. He is on carvedilol and amiodarone for this at home. As documented in history of present illness, he had a TEE  performed 02/16/12 and was planned to undergo cardioversion at that time. The hope was that returned to normal sinus rhythm and improve his heart failure symptoms. Cardioversion was not able to be completed as a result of left atrial appendage clot seen on echo. He had been on Coumadin previously, which was discontinued at his clinic appointment on 02/09/12. Since that time, he's been taking apixaban for anticoagulation. He appears rate controlled on admission. - Monitor on telemetry - Continue carvedilol and amiodarone - Cards consult in am - Will need thyroid follow-up on amiodarone therapy  3) NYHA 3IIb/IV Heart Failure w EF 20-25% (NICM) Last echo 12/13 w EF 20-25%. Patient is s/p ICD for Vtach.  Recently admitted for heart failure exacerbation at the end of January. During that admission, right heart cath was performed and notable for low output. He was started on milrinone after catheterization and diuresed w IV Lasix. He was unable to be weaned from milrinone prior to discharge. He continues to receive milrinone from PICC line at 0.25 mcg per minute. Does not appear to have acute exacerbation of heart failure at this time. Weight today 222 pounds comparable to prior discharge weight of 219 pounds. No pulmonary edema on chest x-ray, no increasing dyspnea. -Continue home Lasix 80 mg twice a day spironolactone 12.5 mg daily -Continue isosorbide-hydralazine 20-37.5 mg 3 times a day -Continue milrinone infusion 0.25 mcg per minute - fluid/sodium restriction  4) Stage III CKD Baseline Cr is around 2. Today is 2.2, close to  baseline, does not appear to have ARF. - Monitor Cr, continue diuresis  5) Gout Without flare currently. - Continue allopurinol 100mg  daily    Dispo: Disposition is deferred at this time, awaiting improvement of current medical problems. Anticipated discharge in approximately 1-2 day(s).   The patient does have a current PCP Robynn Pane, MD), therefore will not be  requiring OPC follow-up after discharge.   The patient does not have transportation limitations that hinder transportation to clinic appointments.  Signed: Bronson Curb 02/18/2012, 10:01 PM

## 2012-02-18 NOTE — ED Notes (Signed)
Pt had TEE done on Friday. Pt began experiencing pain across chest onset yesterday with shortness of breath, nausea, and weakness. Pt currently infusing Milaron into right PICC.

## 2012-02-18 NOTE — ED Provider Notes (Signed)
History     CSN: [id number]  Arrival date & time 02/18/12  1629   First MD Initiated Contact with Patient 02/18/12 1712      Chief Complaint  Patient presents with  . Chest Pain    (Consider location/radiation/quality/duration/timing/severity/associated sxs/prior treatment) Patient is a 62 y.o. male presenting with chest pain. The history is provided by the patient.  Chest Pain He was in the hospital 2 days ago for a cardiovascular procedure. He is in atrial fibrillation and he thought that he was supposed to have something done to get him out of atrial fibrillation. He felt well when he went home from the hospital, but yesterday, he noted an irregular heartbeat associated with a sharp and heavy chest pain with associated dyspnea and nausea and vomiting. This is how he feels when he is in atrial fibrillation. Pain is moderately severe and he rates it at 6/10. He denies diaphoresis. Nothing makes symptoms better nothing makes worse. He took his regular medications but no other treatment was attempted.  Past Medical History  Diagnosis Date  . CHF (congestive heart failure)   . Sarcoidosis   . Cardiomyopathy, dilated, nonischemic     non ischemic by cath  . Acute on chronic systolic heart failure   . Automatic implantable cardiac defibrillator in situ   . Atrial fibrillation   . NSVT (nonsustained ventricular tachycardia)   . GERD (gastroesophageal reflux disease)   . Hypercholesteremia   . Myocardial infarction   . Shortness of breath   . Chronic kidney disease (CKD), stage III (moderate)   . Pacemaker   . Anginal pain   . Gout   . Hypertension     [name]  . Coronary artery disease     Past Surgical History  Procedure Laterality Date  . Back surgery  1987    Ruptured disk repair  . Pacemaker insertion      with ICD  . Tee without cardioversion  01/17/2011    Procedure: TRANSESOPHAGEAL ECHOCARDIOGRAM (TEE);  Surgeon: Ricki Rodriguez, MD;  Location: [facility] ENDOSCOPY;   Service: Cardiovascular;  Laterality: N/A;  . Cardioversion  01/17/2011    Procedure: CARDIOVERSION;  Surgeon: Ricki Rodriguez, MD;  Location: [facility] ENDOSCOPY;  Service: Cardiovascular;  Laterality: N/A;  . Cardiac catheterization    . Insert / replace / remove pacemaker    . Anterior cervical decomp/discectomy fusion  08/21/2011    Procedure: ANTERIOR CERVICAL DECOMPRESSION/DISCECTOMY FUSION 2 LEVELS;  Surgeon: Eldred Manges, MD;  Location: [facility] OR;  Service: Orthopedics;  Laterality: N/A;  C5-6, C6-7 Anterior Cervical Discectomy and Fusion, allograft, plate  . Tee without cardioversion N/A 02/16/2012    Procedure: TRANSESOPHAGEAL ECHOCARDIOGRAM (TEE);  Surgeon: Dolores Patty, MD;  Location: [facility] ENDOSCOPY;  Service: Cardiovascular;  Laterality: N/A;  original case scheduled under his dad (who is deceased), rescheduled under correct mrn/pt/dob. [place]/dl    Family History  Problem Relation Age of Onset  . Heart disease    . Heart failure    . Stroke    . Anesthesia problems Neg Hx   . Hypotension Neg Hx   . Malignant hyperthermia Neg Hx   . Pseudochol deficiency Neg Hx     History  Substance Use Topics  . Smoking status: Never Smoker   . Smokeless tobacco: Never Used  . Alcohol Use: No      Review of Systems  Cardiovascular: Positive for chest pain.  All other systems reviewed and are negative.    Allergies  Ativan  Home Medications   Current Outpatient Rx  Name  Route  Sig  Dispense  Refill  . allopurinol (ZYLOPRIM) 100 MG tablet   Oral   Take 1 tablet (100 mg total) by mouth daily.   30 tablet   6   . amiodarone (PACERONE) 200 MG tablet   Oral   Take 1 tablet (200 mg total) by mouth 2 (two) times daily.   60 tablet   6   . apixaban (ELIQUIS) 5 MG TABS tablet   Oral   Take 1 tablet (5 mg total) by mouth 2 (two) times daily.   60 tablet   3   . carvedilol (COREG) 3.125 MG tablet   Oral   Take 2 tablets (6.25 mg total) by mouth 2 (two) times daily.   60 tablet    3   . furosemide (LASIX) 80 MG tablet   Oral   Take 1 tablet (80 mg total) by mouth 2 (two) times daily.         . isosorbide-hydrALAZINE (BIDIL) 20-37.5 MG per tablet   Oral   Take 1 tablet by mouth 3 (three) times daily.   30 tablet   6   . LORazepam (ATIVAN) 1 MG tablet   Oral   Take 0.5 tablets (0.5 mg total) by mouth 2 (two) times daily as needed for anxiety.   15 tablet   0   . milrinone (PRIMACOR) 20 MG/100ML SOLN infusion   Intravenous   Inject 24.875 mcg/min into the vein continuous.   100 mL      . ondansetron (ZOFRAN-ODT) 8 MG disintegrating tablet   Oral   Take 8 mg by mouth every 8 (eight) hours as needed. 8mg  ODT q4 hours prn nausea         . pantoprazole (PROTONIX) 40 MG tablet   Oral   Take 40 mg by mouth daily.         . polyethylene glycol powder (GLYCOLAX/MIRALAX) powder   Oral   Take 17 g by mouth 2 (two) times daily. Until daily soft stools  OTC   255 g   0   . rosuvastatin (CRESTOR) 5 MG tablet   Oral   Take 1 tablet (5 mg total) by mouth at bedtime.         Marland Kitchen spironolactone (ALDACTONE) 25 MG tablet   Oral   Take 0.5 tablets (12.5 mg total) by mouth daily.   15 tablet   6     BP 98/85  Pulse 100  Temp(Src) 97.6 F (36.4 C) (Oral)  Resp 22  SpO2 99%  Physical Exam  Nursing note and vitals reviewed.  62 year old male, resting comfortably and in no acute distress. Vital signs are significant for tachypnea with respiratory of 22, and borderline hypertension with blood pressure 98/55. Oxygen saturation is 99%, which is normal. Head is normocephalic and atraumatic. PERRLA, EOMI. Oropharynx is clear. Neck is nontender and supple without adenopathy or JVD. There are no carotid bruits. Back is nontender and there is no CVA tenderness. Lungs are clear without rales, wheezes, or rhonchi. Chest is nontender. Heart has regular rate and rhythm without murmur. Abdomen is soft, flat, nontender without masses or hepatosplenomegaly and  peristalsis is normoactive. Extremities have no cyanosis or edema, full range of motion is present. Skin is warm and dry without rash. Neurologic: Mental status is normal, cranial nerves are intact, there are no motor or sensory deficits.  ED Course  Procedures (including critical  care time)  Results for orders placed during the hospital encounter of 02/18/12  CBC WITH DIFFERENTIAL      Result Value Range   WBC 6.0  4.0 - 10.5 K/uL   RBC 4.57  4.22 - 5.81 MIL/uL   Hemoglobin 13.4  13.0 - 17.0 g/dL   HCT 82.9  56.2 - 13.0 %   MCV 88.0  78.0 - 100.0 fL   MCH 29.3  26.0 - 34.0 pg   MCHC 33.3  30.0 - 36.0 g/dL   RDW 86.5  78.4 - 69.6 %   Platelets 262  150 - 400 K/uL   Neutrophils Relative 46  43 - 77 %   Neutro Abs 2.8  1.7 - 7.7 K/uL   Lymphocytes Relative 41  12 - 46 %   Lymphs Abs 2.5  0.7 - 4.0 K/uL   Monocytes Relative 8  3 - 12 %   Monocytes Absolute 0.5  0.1 - 1.0 K/uL   Eosinophils Relative 5  0 - 5 %   Eosinophils Absolute 0.3  0.0 - 0.7 K/uL   Basophils Relative 1  0 - 1 %   Basophils Absolute 0.0  0.0 - 0.1 K/uL  POCT I-STAT, CHEM 8      Result Value Range   Sodium 144  135 - 145 mEq/L   Potassium 3.5  3.5 - 5.1 mEq/L   Chloride 113 (*) 96 - 112 mEq/L   BUN 31 (*) 6 - 23 mg/dL   Creatinine, Ser 2.95 (*) 0.50 - 1.35 mg/dL   Glucose, Bld 87  70 - 99 mg/dL   Calcium, Ion 2.84  1.32 - 1.30 mmol/L   TCO2 25  0 - 100 mmol/L   Hemoglobin 13.9  13.0 - 17.0 g/dL   HCT 44.0  10.2 - 72.5 %  POCT I-STAT TROPONIN I      Result Value Range   Troponin i, poc 0.02  0.00 - 0.08 ng/mL   Comment 3            Dg Chest 2 View  02/18/2012  *RADIOLOGY REPORT*  Clinical Data: Chest pain, shortness of breath, nausea, vomiting.  CHEST - 2 VIEW  Comparison: 02/03/2012  Findings: Left AICD and right PICC line remain in place, unchanged. Cardiomegaly.  No edema.  No focal opacities or effusions.  No acute bony abnormality.  IMPRESSION: Stable cardiomegaly.  No acute cardiopulmonary  disease.   Original Report Authenticated By: Charlett Nose, M.D.      Date: 02/18/2012  Rate: 99  Rhythm: atrial fibrillation  QRS Axis: left  Intervals: QT prolonged  ST/T Wave abnormalities: nonspecific ST/T changes  Conduction Disutrbances:left anterior fascicular block  Narrative Interpretation: Atrial fibrillation with controlled ventricular response, left anterior fascicular block, prolonged QT interval, nonspecific ST and T changes. When compared with ECG of 01/27/2012, no significant changes are seen.  Old EKG Reviewed: unchanged    1. Chest pain   2. Atrial fibrillation   3. Cardiomyopathy       MDM  Atrial fibrillation with controlled ventricular response. Patient with a known history of congestive heart failure on the milrinone infusion. Chest pain is of uncertain cause. I reviewed his hospital record and it appears that he had a transesophageal echocardiogram done prior to a planned cardioversion but clot was seen in the left atrial appendage and it appears that he did not receive cardioversion.  He was given morphine for pain with temporary relief. He will need to be admitted to cycle  cardiac markers. Case is discussed with Dr. Katha Cabal of cardiology service to states he will see him in consultation requested medicine be done by internal medicine. Case is discussed with Dr. Gae Gallop of internal medicine service who agrees to come and admit the patient.     Dione Booze, MD 02/18/12 2156

## 2012-02-18 NOTE — ED Notes (Addendum)
Patient says he has been having chest pain, SOB, weakness since Saturday and he was trying to deal with the pain however his home health nurse advised him he should get to the hospital right away due to his "monitor being in the red zone".   Patient does have a defibrillator/pacemaker on the left side of chest.

## 2012-02-19 ENCOUNTER — Other Ambulatory Visit: Payer: Self-pay

## 2012-02-19 DIAGNOSIS — I5023 Acute on chronic systolic (congestive) heart failure: Secondary | ICD-10-CM

## 2012-02-19 DIAGNOSIS — R079 Chest pain, unspecified: Principal | ICD-10-CM

## 2012-02-19 DIAGNOSIS — I429 Cardiomyopathy, unspecified: Secondary | ICD-10-CM | POA: Insufficient documentation

## 2012-02-19 DIAGNOSIS — I428 Other cardiomyopathies: Secondary | ICD-10-CM

## 2012-02-19 DIAGNOSIS — I4891 Unspecified atrial fibrillation: Secondary | ICD-10-CM

## 2012-02-19 DIAGNOSIS — I509 Heart failure, unspecified: Secondary | ICD-10-CM

## 2012-02-19 LAB — TROPONIN I: Troponin I: <0.3 ng/mL (ref <0.30)

## 2012-02-19 LAB — CBC
MCV: 87.9 fL (ref 78.0–100.0)
Platelets: 214 10*3/uL (ref 150–400)
RDW: 14.1 % (ref 11.5–15.5)
WBC: 5.9 10*3/uL (ref 4.0–10.5)

## 2012-02-19 LAB — COMPREHENSIVE METABOLIC PANEL
Albumin: 3.1 g/dL — ABNORMAL LOW (ref 3.5–5.2)
Alkaline Phosphatase: 66 U/L (ref 39–117)
BUN: 28 mg/dL — ABNORMAL HIGH (ref 6–23)
Chloride: 108 mEq/L (ref 96–112)
GFR calc Af Amer: 44 mL/min — ABNORMAL LOW (ref >90)
Glucose, Bld: 95 mg/dL (ref 70–99)
Potassium: 3.5 mEq/L (ref 3.5–5.1)
Total Bilirubin: 0.6 mg/dL (ref 0.3–1.2)

## 2012-02-19 LAB — RAPID URINE DRUG SCREEN, HOSP PERFORMED: Amphetamines: NOT DETECTED

## 2012-02-19 MED ORDER — FUROSEMIDE 10 MG/ML IJ SOLN
80.0000 mg | Freq: Once | INTRAMUSCULAR | Status: DC
  Administered 2012-02-19: 80 mg via INTRAVENOUS

## 2012-02-19 MED ORDER — FUROSEMIDE 10 MG/ML IJ SOLN
80.0000 mg | Freq: Three times a day (TID) | INTRAMUSCULAR | Status: DC
  Administered 2012-02-19: 80 mg via INTRAVENOUS
  Filled 2012-02-19 (×4): qty 8

## 2012-02-19 MED ORDER — TRAMADOL HCL 50 MG PO TABS
50.0000 mg | ORAL_TABLET | Freq: Four times a day (QID) | ORAL | Status: DC | PRN
  Administered 2012-02-20 – 2012-02-21 (×3): 50 mg via ORAL
  Filled 2012-02-19 (×3): qty 1

## 2012-02-19 MED ORDER — SODIUM CHLORIDE 0.9 % IJ SOLN
10.0000 mL | INTRAMUSCULAR | Status: DC | PRN
  Administered 2012-02-19 – 2012-02-21 (×4): 10 mL

## 2012-02-19 MED ORDER — LORAZEPAM 0.5 MG PO TABS
0.5000 mg | ORAL_TABLET | Freq: Four times a day (QID) | ORAL | Status: DC | PRN
  Administered 2012-02-19 – 2012-02-21 (×5): 0.5 mg via ORAL
  Filled 2012-02-19 (×5): qty 1

## 2012-02-19 NOTE — Progress Notes (Addendum)
Subjective: [name] was seen and examined at bedside this morning.  He claims to feel very anxious at times and is worried since he was not able to be cardioverted at the time of his TEE recently.  He continues to complain of occasional substernal chest pressure, non-radiating, and improves by sitting up. He has shortness of breath with his anxiety.  He denies any fever, chills, N/V/D, abdominal pain, or any urinary complaints at this time.    Objective: Vital signs in last 24 hours: Filed Vitals:   02/18/12 2321 02/19/12 0445 02/19/12 0623 02/19/12 0938  BP: 105/73 112/72  109/78  Pulse: 79 104  89  Temp: 98.6 F (37 C) 98.4 F (36.9 C)    TempSrc: Oral Oral    Resp: 18 18    Height: 6\' 3"  (1.905 m)     Weight: 222 lb 0.1 oz (100.7 kg)  222 lb 10.6 oz (101 kg)   SpO2: 96% 94%  97%   Weight change:   Intake/Output Summary (Last 24 hours) at 02/19/12 1147 Last data filed at 02/19/12 0940  Gross per 24 hour  Intake    900 ml  Output    575 ml  Net    325 ml   Vitals reviewed. General: sitting up on side of bed, anxious HEENT: PERRLA, EOMI, no scleral icterus Cardiac: Irregularly irregular, +2/6 SEM, loudest at apex, +ICD L upper chest. Tenderness to palpation of midsternal chest.   Pulm: clear to auscultation bilaterally, no wheezes, rales, or rhonchi Abd: soft, nontender, nondistended, BS present Ext: warm and well perfused, no pedal edema, +2 dp b/l, R PICC in place.  Neuro: alert and oriented X3, cranial nerves II-XII grossly intact, strength and sensation to light touch equal in bilateral upper and lower extremities  Lab Results: Basic Metabolic Panel:  Recent Labs Lab 02/18/12 1723 02/19/12 0430  NA 144 142  K 3.5 3.5  CL 113* 108  CO2  --  24  GLUCOSE 87 95  BUN 31* 28*  CREATININE 2.20* 1.83*  CALCIUM  --  9.7   Liver Function Tests:  Recent Labs Lab 02/18/12 2210 02/19/12 0430  AST 32 30  ALT 28 26  ALKPHOS 71 66  BILITOT 0.5 0.6  PROT 7.7 7.2   ALBUMIN 3.1* 3.1*    Recent Labs Lab 02/18/12 2210  LIPASE 46   CBC:  Recent Labs Lab 02/18/12 1655 02/18/12 1723 02/19/12 0430  WBC 6.0  --  5.9  NEUTROABS 2.8  --   --   HGB 13.4 13.9 11.4*  HCT 40.2 41.0 35.0*  MCV 88.0  --  87.9  PLT 262  --  214   Cardiac Enzymes:  Recent Labs Lab 02/18/12 2210 02/19/12 0430 02/19/12 0826  TROPONINI <0.30 <0.30 <0.30   BNP:  Recent Labs Lab 02/18/12 2210  PROBNP 3490.0*   Studies/Results: Dg Chest 2 View  02/18/2012  *RADIOLOGY REPORT*  Clinical Data: Chest pain, shortness of breath, nausea, vomiting.  CHEST - 2 VIEW  Comparison: 02/03/2012  Findings: Left AICD and right PICC line remain in place, unchanged. Cardiomegaly.  No edema.  No focal opacities or effusions.  No acute bony abnormality.  IMPRESSION: Stable cardiomegaly.  No acute cardiopulmonary disease.   Original Report Authenticated By: Charlett Nose, M.D.    Medications: I have reviewed the patient's current medications. Scheduled Meds: . allopurinol  100 mg Oral Daily  . amiodarone  200 mg Oral BID  . apixaban  5 mg Oral BID  .  atorvastatin  10 mg Oral q1800  . carvedilol  6.25 mg Oral BID WC  . furosemide  80 mg Oral BID  . isosorbide-hydrALAZINE  1 tablet Oral TID  . pantoprazole  40 mg Oral Daily  . sodium chloride  3 mL Intravenous Q12H  . spironolactone  12.5 mg Oral Daily   Continuous Infusions: . milrinone 0.25 mcg/kg/min (02/19/12 0101)   PRN Meds:.LORazepam, morphine injection, nitroGLYCERIN, ondansetron (ZOFRAN) IV, ondansetron, sodium chloride Assessment/Plan: 1) Chest pain--presented with 3 days of "needle-like" chest pain, anxiety, and nausea. Initially, pain radiating from left shoulder as well as nausea, but no worsening of baseline dyspnea and no chest pressure. EKG without ischemic changes and no change from prior. Symptoms are episodic, and appeared be provoked by anxiety in setting of atrial fibrillation. He's had similar symptoms with  panic attacks in the past. He may have had rapid ventricular rate at home which resolved upon admission.  I discussed with Dr. [name] on the phone today, he knows Mr. Witty well and does not think his pain is cardiac in origin, especially with negative enzymes and unchanged EKG.  He recommends discharge at this time, control of anxiety, and follow up with him as an outpatient later this week.    He does have NYHA class 3/4 heart failure with EF of 20% (see #3 below); however he has not noticed any worsening of his baseline dyspnea. He appears euvolemic without evidence of acute decompensated heart failure exacerbation. He does describe episodes of feeling his heart racing, and is concerned that his atrial fibrillation is out of control. On admission, he appeared rate controlled with ventricular rate of 99 on EKG and 100-110 on the cardiac monitor at bedside. In terms of other etiologies for chest pain, PE is less likely this time. Geneva score is 5 (moderate risk) for tachycardia, but this is explained by his atrial fibrillation. Pain could be musculoskeletal in nature, and his neck pain is reproducible by palpation on physical exam. Other, less likely, considerations would be esophageal trauma during TEE, but no evidence of esophageal perforation on his chest x-ray. No hematemesis and stable hemoglobin.   - Telemonitoring - CEx3 negative - EKG in am no change from admission, prolonged qtc--improved, atrial fibrillation, hr 90bpm, LAD and incomplete LBBB.  - Substitute atorvastatin for home rosuvastatin   2) Atrial fibrillation, rate controlled--history of atrial fibrillation >1 year. He is on carvedilol and amiodarone for this at home.  TEE performed 02/16/12 and was planned to undergo cardioversion at that time with hope to return to normal sinus rhythm and improve heart failure symptoms. Cardioversion was not able to be completed as a result of left atrial appendage clot seen on echo. He had been on  Coumadin previously, which was discontinued at his clinic appointment on 02/09/12 and transitioned to Apixiban for anticoagulation. He appears rate controlled on admission.  - continue telemonitoring - Continue carvedilol and amiodarone  - Cardiology consulted - Will need thyroid follow-up on amiodarone therapy: TSH 6.487 on 1/23  3) NYHA 3IIb/IV Heart Failure w EF 20-25% (NICM)--echo 12/13 w EF 20-25%. Patient is s/p ICD for Vtach. Recently admitted for heart failure exacerbation at the end of January. During that admission, right heart cath was performed and notable for low output. He was started on milrinone after catheterization and diuresed w IV Lasix. He was unable to be weaned from milrinone prior to discharge. He continues to receive milrinone from PICC line at 0.25 mcg per minute.  Does not  appear to have acute exacerbation of heart failure at this time although probnp 3490 on admission. Weight on admission 222 pounds comparable to prior discharge weight of 219 pounds. No pulmonary edema on chest x-ray, no increasing dyspnea.  -Continue home Lasix 80 mg BID and spironolactone 12.5 mg daily  -Continue isosorbide-hydralazine 20-37.5 mg 3 times a day  -Continue milrinone infusion 0.25 mcg per minute  - fluid/sodium restriction   4) Stage III CKD--improving.  Baseline Cr is around 2. Cr 2.2 on admission, close to baseline, does not appear to have ARF. Cr improved to 1.83 2/17.   - Monitor Cr, continue diuresis   5) Gout  Without flare currently.  - Continue allopurinol 100mg  daily   Dispo: Disposition is deferred at this time, awaiting improvement of current medical problems. Anticipated discharge in approximately 1-2 day(s).   The patient does have a current PCP Robynn Pane, MD), therefore will not be requiring OPC follow-up after discharge.  The patient does not have transportation limitations that hinder transportation to clinic appointments.  Services Needed at time of discharge:  Y = Yes, Blank = No PT:   OT:   RN:   Equipment:   Other:     LOS: 1 day   Darden Palmer 02/19/2012, 11:47 AM

## 2012-02-19 NOTE — Progress Notes (Signed)
I have reviewed and agree with Medical student Rhea Pink. Please see my separate note .   Signed: Darden Palmer, MD PGY-I, Internal Medicine Resident Pager: [phone]  02/19/2012,3:46 PM

## 2012-02-19 NOTE — Progress Notes (Signed)
Utilization Review Completed.   Quanasia Defino, RN, BSN Nurse Case Manager  [phone]  

## 2012-02-19 NOTE — H&P (Signed)
Patient seen and examined, above confirmed.  NIDCM and afib presented with chest pain associated with heart racing and anxiety.  He  Has been sleeping poorly and continues to be anxious.  Cardiology to see but does not appear to have any acute cardiac issues.  I agree with control of anxiety.

## 2012-02-19 NOTE — Progress Notes (Signed)
Medical Student Daily Progress Note  Subjective: Patient reports having dyspnea overnight especially when he tries to lay down to sleep. He felt that there was something pressing up on his lungs causing him not be able to breath well. He continues to complain of left sided neck pain and headache in the left frontal sinus area. He says that the headache feels like a sinus headache. He continues to feel a sharp and cramp-like pain across his left chest that lasts for 30 minutes to hours. It is better when he sits up, and worse when he lies down. He no longer feels nauseated. Objective: Vital signs in last 24 hours: Filed Vitals:   02/18/12 2318 02/18/12 2321 02/19/12 0445 02/19/12 0623  BP: 117/75 105/73 112/72   Pulse: 75 79 104   Temp:  98.6 F (37 C) 98.4 F (36.9 C)   TempSrc:  Oral Oral   Resp:  18 18   Height:  6\' 3"  (1.905 m)    Weight:  100.7 kg (222 lb 0.1 oz)  101 kg (222 lb 10.6 oz)  SpO2:  96% 94%    Weight change:   Intake/Output Summary (Last 24 hours) at 02/19/12 0854 Last data filed at 02/19/12 1610  Gross per 24 hour  Intake    800 ml  Output    575 ml  Net    225 ml   Physical Exam: Gen: alert, oriented, appears anxious, sitting on the edge of his bed, interacting appropriately  HEENT: PERRLA, EOMI, MMM, there is some white-to-gray opacities in both eyes (patient said that he was diagnosed with glaucoma 2 years ago), equal hearing bilaterally, supple neck, no lymphadenopathy CV: irregularly irregular heart rate, no murmurs, no gallops, no thrills, no cyanosis, no peripheral edema Pulm: CTAB, tympanic on percussion Neuro: A&Ox3, CN II-XII grossly intact, 2+ patellar reflex bilaterally, no focal loss of sensation MSK: 5/5 UE and LE strength, good range of motion in knee and shoulder Skin: warm, dry, intact, no rash  Lab Results: BMET    Component Value Date/Time   NA 142 02/19/2012 0430   K 3.5 02/19/2012 0430   CL 108 02/19/2012 0430   CO2 24 02/19/2012 0430   GLUCOSE 95 02/19/2012 0430   BUN 28* 02/19/2012 0430   CREATININE 1.83* 02/19/2012 0430   CALCIUM 9.7 02/19/2012 0430   GFRNONAA 38* 02/19/2012 0430   GFRAA 44* 02/19/2012 0430   CBC    Component Value Date/Time   WBC 5.9 02/19/2012 0430   RBC 3.98* 02/19/2012 0430   HGB 11.4* 02/19/2012 0430   HCT 35.0* 02/19/2012 0430   PLT 214 02/19/2012 0430   MCV 87.9 02/19/2012 0430   MCH 28.6 02/19/2012 0430   MCHC 32.6 02/19/2012 0430   RDW 14.1 02/19/2012 0430   LYMPHSABS 2.5 02/18/2012 1655   MONOABS 0.5 02/18/2012 1655   EOSABS 0.3 02/18/2012 1655   BASOSABS 0.0 02/18/2012 1655   Cardiac Panel (last 3 results)  Recent Labs  02/18/12 2210 02/19/12 0430 02/19/12 0826  TROPONINI <0.30 <0.30 <0.30     BNP    Component Value Date/Time   PROBNP 3490.0* 02/18/2012 2210   Glucose: 95  ECG: 02/19/2012 -Irregularly irregular rhythm -PR interval: cannot be appreciated -QRS interval: prolonged -Ventricular rate of 90bpm -QT/QTc: 414/506 -Axis: LAD -No hypertrophy -T wave inversions in AVR and AVL, same from before -Incomplete left bundle branch block; less prominent than before  Micro Results: No results found for this or any previous visit (from the past  240 hour(s)). Studies/Results: Dg Chest 2 View  02/18/2012  *RADIOLOGY REPORT*  Clinical Data: Chest pain, shortness of breath, nausea, vomiting.  CHEST - 2 VIEW  Comparison: 02/03/2012  Findings: Left AICD and right PICC line remain in place, unchanged. Cardiomegaly.  No edema.  No focal opacities or effusions.  No acute bony abnormality.  IMPRESSION: Stable cardiomegaly.  No acute cardiopulmonary disease.   Original Report Authenticated By: Charlett Nose, M.D.    Medications: I have reviewed the patient's current medications. Scheduled Meds: . allopurinol  100 mg Oral Daily  . amiodarone  200 mg Oral BID  . apixaban  5 mg Oral BID  . atorvastatin  10 mg Oral q1800  . carvedilol  6.25 mg Oral BID WC  . furosemide  80 mg Oral BID  .  isosorbide-hydrALAZINE  1 tablet Oral TID  . pantoprazole  40 mg Oral Daily  . sodium chloride  3 mL Intravenous Q12H  . spironolactone  12.5 mg Oral Daily   Continuous Infusions: . milrinone 0.25 mcg/kg/min (02/19/12 0101)   PRN Meds:.LORazepam, morphine injection, nitroGLYCERIN, ondansetron (ZOFRAN) IV, ondansetron, sodium chloride Assessment/Plan: Principal Problem:   Chest pain Active Problems:   GLUCOSE INTOLERANCE   HYPERCHOLESTEROLEMIA   HYPERTENSION   CARDIOMYOPATHY, DILATED   GASTROESOPHAGEAL REFLUX DISEASE   CHRONIC KIDNEY DISEASE STAGE II (MILD)   Chronic systolic CHF (congestive heart failure), NYHA class 2   Cardiomyopathy, dilated, nonischemic   Atrial fibrillation  Mr. 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   1. Chest pain Anxiety: Patient's symptoms are likely due to anxiety because his symptoms appeared after he received news that cardioversion cannot be performed due to a clot in the left atrial appendage. Patient appeared anxious, and says that Ativan gives him a few hours of relief, but the sensation would return. His needle-like chest pain, nausea, and headache could be symptoms of anxiety. The most concerning fact is that his chest pain does not relieve with anxiety medication. Cardiology was consulted, and they have decided that patient can be discharged at this time.  CHF: CHF can lead to chest pain, but rarely. Patient does have nonischemic dilated cardiac myopathy with EF of 20-25%. Patient's pro-BNP level is also elevated at 3490.The low cardiac output can explain some of his symptoms of dizziness and shortness of breath on exertion. Patient does not have overt signs of CHF  such as peripheral edema, but does demonstrate orthopnea.  MSK causes: the description of patient's pain could be MSK in origin, but patient has not experienced any trauma to the region. Thus, this is not a likely cause.  ACS: Patient is unlikely to have ACS because his troponin I levels have been normal on 3 different occasions. The T wave inversions have been there since January 2014. His chest pain symptoms are not crushing in nature and it does not radiate. New ECG today is unchanged. -Continue to treat anxiety with Ativan PRN at home -Continue atorvastatin  2. Atrial fibrillation, rate controlled Patient was diagnosed with atrial fibrillation last year and is on carvedilol and amiodarone for this at home. He had a TEE performed on 02/16/12. His scheduled cardioversion was canceled at that time due to the finding of clot in the left atrial appendage on ECHO. He was treated  with warfarin for anticoagulation, but has switched to apixaban on 02/09/2012. He appears to be controlled with ventricular rate of 90bpm. -Continue apixaban -Continue amiodarone -Continue carvedilol  3. NYHA 3IIb/IV heart failure with EF 20-25% Patient presented with SOB, but no peripheral edema or pulmonary findings indicating exacerbated HF. His EF is at 20-25% chronically, and is s/p ICD for Vtach. He was recently admitted for heart failure exacerbation at the end of January, but does not seem to have symptoms of acute exacerbation of HF at this time.  -Continue home Lasix 80mg  BID,  -Continue spironolactone 12.5mg  qD -Continue isosorbide-hydralazine 20-37.5mg  TID -Continue milrinone infusion 0.42mcg per minute (continued from last hospitalization)  -Fluid/sodium restriction  4. Stage III CKD Baseline Cr is around 2. Today is 1.83, close to baseline. Patient does not have ARF. -Monitor Cr, continue diuresis  5. Gout No active flare -continue on allopurinol 100mg  qD  FEN/GI: -Heart-healthy diet  Proph: -DVT: not  indicated at this time. Patient is on apixaban.   LOS: 1 day   This is a Psychologist, occupational Note.  The care of the patient was discussed with Dr. Virgina Organ and the assessment and plan formulated with their assistance.  Please see their attached note for official documentation of the daily encounter.  Rhea Pink 02/19/2012, 8:54 AM

## 2012-02-19 NOTE — Consult Note (Addendum)
Advanced Heart Failure Team History and Physical Note   Primary Physician: Primary Cardiologist:    Reason for Admission: Chest pain and systolic heart failure  HPI:    [name] is a 62 y.o. gentlemen with multiple hospital admissions for HF exacerbations. 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 He also has history of ventricular tachycardia s/p St Jude ICD by [name], chronic kidney disease: stage III, baseline Cr ~2, and paroxysmal atrial arrhythmias controlled by amiodarone. As well as sarcoidosis, hypertension. Cath in 2008 by Dr. [name] showed no CAD.   RHC 01/26/12  4/14  RA = 14  RV = 51/16/15  PA = 57/34 (43)  PCW = 26  Fick cardiac output/index = 4.2/1.8  Thermo CO/CI = 3.9/1.7  PVR = 4.0 [place]  O2 sat = 97%  PA sat = 57%, 58%  Ao Pressure (non-invasive): 116/80 (93)  SVR = 1504   Discharged on milrinone 0.25 mcg/kg/min via PICC with some improvements in his symptoms.  He had follow up in the HF clinic on 2/7.   Weight stable but remained in Afib.  He was started on apixaban and amiodarone increased 200 mg BID.  He underwent TEE on 2/14 showing heavy smoke and early clot LAA.  Will be continued on apixaban and rate control for atrial fibrillation.  He states over the last several days he has been feeling his heart flutter and weight increased 4 pounds overnight.  He tried to take extra 1/2 of lasix without any change.  He presented to the ER with "chest pain" that is a tingling/pin prick feeling that occurs at rest or movement.  His weight is up 3 pounds since discharge.  He ruled out for MI with negative troponin x3 but continues to have pain in chest.  ProBNP 3490.  Cr mildly improved from 2.2 to 1.8.     Review of Systems: [y] = yes, [ ]  = no   General: Weight gain [place] ]; Weight loss [ ] ; Anorexia [ ] ; Fatigue [ y]; Fever [ ] ; Chills [ ] ; Weakness [ ]    Cardiac: Chest pain/pressure [ y]; Resting SOB [ ] ; Exertional SOB [place] ]; Orthopnea [ ] ; Pedal Edema [ ] ; Palpitations [place] ]; Syncope [ ] ; Presyncope [ ] ; Paroxysmal nocturnal dyspnea[ ]   Pulmonary: Cough [ ] ; Wheezing[ ] ; Hemoptysis[ ] ; Sputum [ ] ; Snoring [ ]   GI: Vomiting[ ] ; Dysphagia[ ] ; Melena[ ] ; Hematochezia [ ] ; Heartburn[ ] ; Abdominal pain [ ] ; Constipation [ ] ; Diarrhea [ ] ; BRBPR [ ]   GU: Hematuria[ ] ; Dysuria [ ] ; Nocturia[ ]   Vascular: Pain in legs with walking [ ] ; Pain in feet with lying flat [ ] ; Non-healing sores [ ] ; Stroke [ ] ; TIA [ ] ; Slurred speech [ ] ;  Neuro: Headaches[ ] ; Vertigo[ ] ; Seizures[ ] ; Paresthesias[ ] ;Blurred vision [ ] ; Diplopia [ ] ; Vision changes [ ]   Ortho/Skin: Arthritis [ ] ; Joint pain [ ] ; Muscle pain [ ] ; Joint swelling [ ] ; Back Pain [ ] ; Rash [ ]   Psych: Depression[ ] ; Anxiety[ ]   Heme: Bleeding problems [ ] ; Clotting disorders [ ] ; Anemia [ ]   Endocrine: Diabetes [ ] ; Thyroid dysfunction[ ]   Home Medications Prior to Admission medications   Medication Sig Start Date End Date Taking? Authorizing Provider  allopurinol (ZYLOPRIM) 100 MG tablet Take 1 tablet (100 mg total) by mouth daily. 02/05/12  Yes Hadassah Pais, PA  amiodarone (PACERONE)  200 MG tablet Take 1 tablet (200 mg total) by mouth 2 (two) times daily. 02/09/12  Yes Amy D Clegg, NP  apixaban (ELIQUIS) 5 MG TABS tablet Take 1 tablet (5 mg total) by mouth 2 (two) times daily. 02/09/12  Yes Amy D Clegg, NP  carvedilol (COREG) 3.125 MG tablet Take 2 tablets (6.25 mg total) by mouth 2 (two) times daily. 02/05/12  Yes Hadassah Pais, PA  furosemide (LASIX) 80 MG tablet Take 1 tablet (80 mg total) by mouth 2 (two) times daily. 02/05/12  Yes Hadassah Pais, PA  isosorbide-hydrALAZINE (BIDIL) 20-37.5 MG per tablet Take 1 tablet by mouth 3 (three) times daily. 02/05/12  Yes Hadassah Pais, PA  LORazepam (ATIVAN) 1 MG tablet Take 0.5 tablets (0.5 mg total) by mouth 2 (two) times daily as needed for anxiety.  01/15/12  Yes Hannah Muthersbaugh, PA-C  milrinone (PRIMACOR) 20 MG/100ML SOLN infusion Inject 24.875 mcg/min into the vein continuous. 02/05/12  Yes Hadassah Pais, PA  ondansetron (ZOFRAN-ODT) 8 MG disintegrating tablet Take 8 mg by mouth every 8 (eight) hours as needed. 8mg  ODT q4 hours prn nausea 01/19/12  Yes Phill Mutter Dammen, PA  pantoprazole (PROTONIX) 40 MG tablet Take 40 mg by mouth daily.   Yes Historical Provider, MD  polyethylene glycol powder (GLYCOLAX/MIRALAX) powder Take 17 g by mouth 2 (two) times daily. Until daily soft stools  OTC 01/15/12  Yes Hannah Muthersbaugh, PA-C  rosuvastatin (CRESTOR) 5 MG tablet Take 1 tablet (5 mg total) by mouth at bedtime. 11/13/11  Yes Verne Spurr, PA-C  spironolactone (ALDACTONE) 25 MG tablet Take 0.5 tablets (12.5 mg total) by mouth daily. 02/05/12  Yes Hadassah Pais, PA    Past Medical History: Past Medical History  Diagnosis Date  . CHF (congestive heart failure)   . Sarcoidosis   . Cardiomyopathy, dilated, nonischemic     non ischemic by cath  . Acute on chronic systolic heart failure   . Automatic implantable cardiac defibrillator in situ   . Atrial fibrillation   . NSVT (nonsustained ventricular tachycardia)   . GERD (gastroesophageal reflux disease)   . Hypercholesteremia   . Myocardial infarction   . Shortness of breath   . Chronic kidney disease (CKD), stage III (moderate)   . Pacemaker   . Anginal pain   . Gout   . Hypertension     [name]  . Coronary artery disease     Past Surgical History: Past Surgical History  Procedure Laterality Date  . Back surgery  1987    Ruptured disk repair  . Pacemaker insertion      with ICD  . Tee without cardioversion  01/17/2011    Procedure: TRANSESOPHAGEAL ECHOCARDIOGRAM (TEE);  Surgeon: Ricki Rodriguez, MD;  Location: [facility] ENDOSCOPY;  Service: Cardiovascular;  Laterality: N/A;  . Cardioversion  01/17/2011    Procedure: CARDIOVERSION;  Surgeon: Ricki Rodriguez, MD;  Location: [facility]  ENDOSCOPY;  Service: Cardiovascular;  Laterality: N/A;  . Cardiac catheterization    . Insert / replace / remove pacemaker    . Anterior cervical decomp/discectomy fusion  08/21/2011    Procedure: ANTERIOR CERVICAL DECOMPRESSION/DISCECTOMY FUSION 2 LEVELS;  Surgeon: Eldred Manges, MD;  Location: [facility] OR;  Service: Orthopedics;  Laterality: N/A;  C5-6, C6-7 Anterior Cervical Discectomy and Fusion, allograft, plate  . Tee without cardioversion N/A 02/16/2012    Procedure: TRANSESOPHAGEAL ECHOCARDIOGRAM (TEE);  Surgeon: Dolores Patty, MD;  Location: [facility] ENDOSCOPY;  Service: Cardiovascular;  Laterality: N/A;  original case scheduled under his dad (who is deceased), rescheduled under correct mrn/pt/dob. [place]/dl    Family History: Family History  Problem Relation Age of Onset  . Heart disease    . Heart failure    . Stroke    . Anesthesia problems Neg Hx   . Hypotension Neg Hx   . Malignant hyperthermia Neg Hx   . Pseudochol deficiency Neg Hx     Social History: History   Social History  . Marital Status: Divorced    Spouse Name: N/A    Number of Children: N/A  . Years of Education: N/A   Social History Main Topics  . Smoking status: Never Smoker   . Smokeless tobacco: Never Used  . Alcohol Use: No  . Drug Use: Yes    Special: "Crack" cocaine, Marijuana, Cocaine     Comment: last use Novemenber  . Sexually Active: Not Currently   Other Topics Concern  . None   Social History Narrative  . None    Allergies:  No Active Allergies  Objective:    Vital Signs:   Temp:  [98.3 F (36.8 C)-98.6 F (37 C)] 98.3 F (36.8 C) (02/17 1449) Pulse Rate:  [25-106] 95 (02/17 1449) Resp:  [18-31] 20 (02/17 1449) BP: (93-129)/(67-114) 107/77 mmHg (02/17 1449) SpO2:  [93 %-100 %] 93 % (02/17 1449) Weight:  [222 lb 0.1 oz (100.7 kg)-222 lb 10.6 oz (101 kg)] 222 lb 10.6 oz (101 kg) (02/17 0623) Last BM Date: 02/18/12 Filed Weights   02/18/12 2300 02/18/12 2321 02/19/12 1610  Weight:  222 lb 0.1 oz (100.7 kg) 222 lb 0.1 oz (100.7 kg) 222 lb 10.6 oz (101 kg)    Physical Exam: General: Well appearing. No resp difficulty  HEENT: normal  Neck: supple. JVP 8-9. Carotids 2+ bilat; no bruits. No lymphadenopathy or thryomegaly appreciated.  Cor: PMI nondisplaced. Irregular rate & rhythm. 3/46m MR. +S3  Lungs: clear 2 liters  Abdomen: soft, nontender, obese, . Palpable liver edge. No bruits or masses. Good bowel sounds.  Extremities: no cyanosis, clubbing, rash, trace lower extremity edema. RUE PICC  Neuro: alert & orientedx3, cranial nerves grossly intact. moves all 4 extremities w/o difficulty. Affect pleasant    Telemetry: atrial fib 90-120s  Labs: Basic Metabolic Panel:  Recent Labs Lab 02/18/12 1723 02/19/12 0430  NA 144 142  K 3.5 3.5  CL 113* 108  CO2  --  24  GLUCOSE 87 95  BUN 31* 28*  CREATININE 2.20* 1.83*  CALCIUM  --  9.7    Liver Function Tests:  Recent Labs Lab 02/18/12 2210 02/19/12 0430  AST 32 30  ALT 28 26  ALKPHOS 71 66  BILITOT 0.5 0.6  PROT 7.7 7.2  ALBUMIN 3.1* 3.1*    Recent Labs Lab 02/18/12 2210  LIPASE 46   No results found for this basename: AMMONIA,  in the last 168 hours  CBC:  Recent Labs Lab 02/18/12 1655 02/18/12 1723 02/19/12 0430  WBC 6.0  --  5.9  NEUTROABS 2.8  --   --   HGB 13.4 13.9 11.4*  HCT 40.2 41.0 35.0*  MCV 88.0  --  87.9  PLT 262  --  214    Cardiac Enzymes:  Recent Labs Lab 02/18/12 2210 02/19/12 0430 02/19/12 0826  TROPONINI <0.30 <0.30 <0.30    BNP: BNP (last 3 results)  Recent Labs  01/30/12 0326 02/03/12 0500 02/18/12 2210  PROBNP 2314.0* 3722.0* 3490.0*    CBG: No results found  for this basename: GLUCAP,  in the last 168 hours  Coagulation Studies: No results found for this basename: LABPROT, INR,  in the last 72 hours  Other results: EKG: afib 90 bpm, no acute changes.  Imaging: Dg Chest 2 View  02/18/2012  *RADIOLOGY REPORT*  Clinical Data: Chest pain,  shortness of breath, nausea, vomiting.  CHEST - 2 VIEW  Comparison: 02/03/2012  Findings: Left AICD and right PICC line remain in place, unchanged. Cardiomegaly.  No edema.  No focal opacities or effusions.  No acute bony abnormality.  IMPRESSION: Stable cardiomegaly.  No acute cardiopulmonary disease.   Original Report Authenticated By: Charlett Nose, M.D.          Assessment:   1. A/C systolic heart failure EF 20%  --with multiple readmissions  2. NICM  3. Chronic renal failure  4. H/o VT s/p ICD  5. 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 Chronic AF  7. Chest pain  Plan/Discussion:    Mr. Ligman presents with chest pain and volume overload despite increase dose of oral lasix at home.  Chest pain is most likely secondary to increased volume given normal troponins x3 and no ischemic changes on EKG.  Will give a dose of IV lasix 80 mg now.  Reassess volume status in the morning.  He may benefit from changing lasix to torsemide at discharge as lasix 80 mg BID with increase to 120/80 mg was not enough to keep euvolemic.  He states he is compliant with diet and fluid restrictions.    He is currently being considered for LVAD.  With improvement in Cr can undergo further discussion of possible VAD placement. Of note, had history of +cocaine on drug screen in November but since then 3 negative drug screens.   Length of Stay: 1   Robbi Garter, [facility] 02/19/2012, 5:08 PM  Patient seen with PA, agree with the above note.  Patient does appear volume overloaded on exam.  1. Chest pain: I do not think this is ischemic chest pain.  It could be related to volume overload.  Cardiac enzymes negative.  No further workup of this for now.  2. Acute on chronic systolic CHF: Patient is volume overloaded on exam with elevated JVP and abdominal distention.  He is more short of breath.  He is on home milrinone.  - Lasix 80 mg IV every 8 hours and continue other medications.  Will probably send home  on torsemide rather than Lasix.  - Check co-ox.  - Long-term, he may be an LVAD candidate.  Need further discussion with patient and need to attempt conversion to NSR.  3. Atrial fibrillation: Persistent.  On Friday, TEE showed possible early LAA thrombus.  Would continue apixaban x 1 month then attempt cardioversion on amiodarone.  Will then see if milrinone can be titrated off while in NSR.    Marca Ancona 02/19/2012 5:45 PM

## 2012-02-20 ENCOUNTER — Other Ambulatory Visit: Payer: Self-pay

## 2012-02-20 LAB — CARBOXYHEMOGLOBIN
O2 Saturation: 79 %
Total hemoglobin: 11.4 g/dL — ABNORMAL LOW (ref 13.5–18.0)

## 2012-02-20 LAB — BASIC METABOLIC PANEL
Calcium: 9.6 mg/dL (ref 8.4–10.5)
Chloride: 100 mEq/L (ref 96–112)
Creatinine, Ser: 2.12 mg/dL — ABNORMAL HIGH (ref 0.50–1.35)
GFR calc Af Amer: 37 mL/min — ABNORMAL LOW (ref >90)

## 2012-02-20 LAB — CBC
MCV: 86.4 fL (ref 78.0–100.0)
Platelets: 192 10*3/uL (ref 150–400)
RDW: 13.9 % (ref 11.5–15.5)
WBC: 4.6 10*3/uL (ref 4.0–10.5)

## 2012-02-20 LAB — TYPE AND SCREEN: ABO/RH(D): O POS

## 2012-02-20 MED ORDER — FUROSEMIDE 10 MG/ML IJ SOLN
80.0000 mg | Freq: Two times a day (BID) | INTRAMUSCULAR | Status: DC
  Administered 2012-02-20 (×2): 80 mg via INTRAVENOUS
  Filled 2012-02-20 (×2): qty 8

## 2012-02-20 MED ORDER — POTASSIUM CHLORIDE CRYS ER 20 MEQ PO TBCR
40.0000 meq | EXTENDED_RELEASE_TABLET | Freq: Three times a day (TID) | ORAL | Status: AC
  Administered 2012-02-20 – 2012-02-21 (×3): 40 meq via ORAL
  Filled 2012-02-20 (×3): qty 2

## 2012-02-20 MED ORDER — ISOSORB DINITRATE-HYDRALAZINE 20-37.5 MG PO TABS
0.5000 | ORAL_TABLET | Freq: Three times a day (TID) | ORAL | Status: DC
  Administered 2012-02-20 – 2012-02-21 (×4): 0.5 via ORAL
  Filled 2012-02-20 (×5): qty 0.5

## 2012-02-20 MED ORDER — CARVEDILOL 3.125 MG PO TABS
3.1250 mg | ORAL_TABLET | Freq: Two times a day (BID) | ORAL | Status: DC
  Administered 2012-02-20 – 2012-02-21 (×3): 3.125 mg via ORAL
  Filled 2012-02-20 (×4): qty 1

## 2012-02-20 NOTE — Progress Notes (Signed)
Patient seen and examined with the resident team, see Dr. Waynard Reeds note for full details. He continues to have bouts of anxiety and his volume status is still positive.  Appreciate recs and help from heart failure team.  Will continue diuresis and likely dc in 1-2 days.

## 2012-02-20 NOTE — Progress Notes (Signed)
Subjective: [name] was seen and examined at bedside this morning.  He was lying comfortably in bed but still complains of occasional chest pain and shortness of breath.  He was seen by heart failure team yesterday.  He claims the pain improves with ativan and pain medication.  He denies any fever, chills, N/V/D, abdominal pain, or any urinary complaints at this time.    Objective: Vital signs in last 24 hours: Filed Vitals:   02/19/12 1449 02/19/12 2156 02/20/12 0639 02/20/12 0953  BP: 107/77 92/68 90/72  84/64  Pulse: 95 85 92 73  Temp: 98.3 F (36.8 C) 98.3 F (36.8 C) 98.9 F (37.2 C)   TempSrc: Oral Oral Oral   Resp: 20 20 18    Height:      Weight:   221 lb 9.6 oz (100.517 kg)   SpO2: 93% 96% 92% 100%   Weight change: -6.5 oz (-0.183 kg)  Intake/Output Summary (Last 24 hours) at 02/20/12 1013 Last data filed at 02/20/12 1000  Gross per 24 hour  Intake   1430 ml  Output   3075 ml  Net  -1645 ml   Vitals reviewed. General: sitting up on side of bed, anxious HEENT: PERRLA, EOMI, no scleral icterus Cardiac: Irregularly irregular, +2/6 SEM, loudest at apex, +ICD L upper chest. Tenderness to palpation of midsternal chest.    Pulm: clear to auscultation bilaterally, no wheezes, rales, or rhonchi Abd: soft, nontender, nondistended, BS present Ext: warm and well perfused, no pedal edema, +2 dp b/l, R PICC in place.  Neuro: alert and oriented X3, cranial nerves II-XII grossly intact, strength and sensation to light touch equal in bilateral upper and lower extremities  Lab Results: Basic Metabolic Panel:  Recent Labs Lab 02/18/12 1723 02/19/12 0430  NA 144 142  K 3.5 3.5  CL 113* 108  CO2  --  24  GLUCOSE 87 95  BUN 31* 28*  CREATININE 2.20* 1.83*  CALCIUM  --  9.7   Liver Function Tests:  Recent Labs Lab 02/18/12 2210 02/19/12 0430  AST 32 30  ALT 28 26  ALKPHOS 71 66  BILITOT 0.5 0.6  PROT 7.7 7.2  ALBUMIN 3.1* 3.1*    Recent Labs Lab 02/18/12 2210   LIPASE 46   CBC:  Recent Labs Lab 02/18/12 1655 02/18/12 1723 02/19/12 0430  WBC 6.0  --  5.9  NEUTROABS 2.8  --   --   HGB 13.4 13.9 11.4*  HCT 40.2 41.0 35.0*  MCV 88.0  --  87.9  PLT 262  --  214   Cardiac Enzymes:  Recent Labs Lab 02/18/12 2210 02/19/12 0430 02/19/12 0826  TROPONINI <0.30 <0.30 <0.30   BNP:  Recent Labs Lab 02/18/12 2210  PROBNP 3490.0*   Studies/Results: Dg Chest 2 View  02/18/2012  *RADIOLOGY REPORT*  Clinical Data: Chest pain, shortness of breath, nausea, vomiting.  CHEST - 2 VIEW  Comparison: 02/03/2012  Findings: Left AICD and right PICC line remain in place, unchanged. Cardiomegaly.  No edema.  No focal opacities or effusions.  No acute bony abnormality.  IMPRESSION: Stable cardiomegaly.  No acute cardiopulmonary disease.   Original Report Authenticated By: Charlett Nose, M.D.    Medications: I have reviewed the patient's current medications. Scheduled Meds: . allopurinol  100 mg Oral Daily  . amiodarone  200 mg Oral BID  . apixaban  5 mg Oral BID  . atorvastatin  10 mg Oral q1800  . carvedilol  6.25 mg Oral BID WC  .  furosemide  80 mg Intravenous TID  . isosorbide-hydrALAZINE  1 tablet Oral TID  . pantoprazole  40 mg Oral Daily  . sodium chloride  3 mL Intravenous Q12H  . spironolactone  12.5 mg Oral Daily   Continuous Infusions: . milrinone 0.25 mcg/kg/min (02/20/12 0647)   PRN Meds:.LORazepam, morphine injection, nitroGLYCERIN, ondansetron (ZOFRAN) IV, ondansetron, sodium chloride, traMADol Assessment/Plan: [name] is a 62 y.o. 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, CKD stage III, and paroxysmal atrial arrhythmias controlled by amiodarone. As well as sarcoidosis, hypertension. Cath in 2008 by Dr. [name] showed no CAD.   1) Chest pain--likely not ischemic, possibly  secondary to volume overload in setting of chronic CHF and provoked by anxiety.  Presented with 3 days of episodic "needle-like" chest pain, anxiety, and nausea, pain radiating from left shoulder but no worsening of baseline dyspnea. EKG without ischemic changes and no change from prior. Symptoms are episodic, and appeared be provoked by anxiety in setting of atrial fibrillation. He's had similar symptoms with panic attacks in the past. He may have had rapid ventricular rate at home which resolved upon admission?  I discussed with Dr. [name] on the phone 2/17, he knows [name] well and does not think his pain is ischemic in origin, especially with negative enzymesx3 and unchanged EKG.  He recommends discharge at this time, control of anxiety, and follow up with him as an outpatient later this week.  proBNP 3490 on admission.    Other causes of chest pain include but less less likely PE with Geneva score of 5 (moderate risk) for tachycardia, but this is also explained by his atrial fibrillation, chronic dyspnea, no lower extremity pain, and pt is fairly mobile. Pain could be musculoskeletal in nature, and his neck pain is reproducible by palpation on physical exam.  -appreciate heart failure team input--Dr. Shirlee Latch also does not think CP is ischemic, ?2/2 volume overload.  Continue IV Lasix 80mg  q8h, may d/c on torsemide in place of lasix.  Possible LVAD candidate.   - continue telemonitoring - EKG in am no change from admission,improved qtc (476 today) - Continue atorvastatin - nitroglycerin prn--although patient says he does not like nitro and how he feels with no help in symptoms - pain control: prn tramadol  2) Atrial fibrillation, rate controlled--history of atrial fibrillation >1 year. He is on carvedilol and amiodarone for this at home.  TEE performed 02/16/12 and was planned to undergo cardioversion at that time with hope to return to normal sinus rhythm and improve heart failure symptoms.  Cardioversion was not able to be completed as a result of left atrial appendage clot seen on echo. He had been on Coumadin previously, which was discontinued at his clinic appointment on 02/09/12 and transitioned to Apixiban for anticoagulation. He appears rate controlled on admission.  - continue telemonitoring - Continue carvedilol and amiodarone - Continue apixaban x1 month and attempt cardioversion on amiodarone per Dr. Shirlee Latch - Will need thyroid follow-up on amiodarone therapy: TSH 6.487 on 1/23  3) NYHA 3IIb/IV Heart Failure w EF 20-25% (NICM)--echo 12/13 w EF 20-25%. Multiple hospital admissions for HF exacerbations with most recent admission at the end of January. During that admission, right heart cath was performed and notable for low output. He was started on milrinone after catheterization and diuresed w IV Lasix. He continues to receive milrinone from PICC  line at 0.25 mcg per minute.  Probnp 3490 on admission, likely volume overloaded initially. Weight on admission 222 pounds comparable to prior discharge weight of 219 pounds. No pulmonary edema on chest x-ray, chronic dyspnea.  -Lasix IV 80 mg q8h per heart failure team  -Continue spironolactone 12.5 mg daily  -Continue isosorbide-hydralazine 20-37.5 mg 3 times a day  -Continue milrinone infusion 0.25 mcg per minute  - fluid/sodium restriction   4) Stage III CKD--improving.  Baseline Cr is around 2. Cr 2.2 on admission, close to baseline, does not appear to have ARF. Cr improved to 1.83 2/17.   - Monitor Cr, continue diuresis   5) Gout  Without flare currently.  - Continue allopurinol 100mg  daily   Dispo: Disposition is deferred at this time, awaiting improvement of current medical problems. Anticipated discharge in approximately 1 day(s).   The patient does have a current PCP Robynn Pane, MD), therefore will not be requiring OPC follow-up after discharge.  The patient does not have transportation limitations that hinder  transportation to clinic appointments.  Services Needed at time of discharge: Y = Yes, Blank = No PT:   OT:   RN:   Equipment:   Other:     LOS: 2 days   Darden Palmer 02/20/2012, 10:13 AM

## 2012-02-20 NOTE — Progress Notes (Signed)
I have reviewed and agree with Medical student Rhea Pink. Please see my separate note .   Signed: Darden Palmer, MD PGY-I, Internal Medicine Resident Pager: [phone]  02/20/2012,5:52 PM

## 2012-02-20 NOTE — Progress Notes (Signed)
Medical Student Daily Progress Note  Subjective: Patient complained of chest pain at 8/10 in the evening, but pain was resolved with morphine. Patient said that this pain was similar to pain that he experienced before, heavy, needle-like pain that does not radiate. It is not made worse with arm movement or movement. Patient did not notice any increase in dyspnea during this episode or sweating or palpitations. He says that his headache and left neck pain is still there, and has not worsen.   Patient has experienced some anxiety yesterday due to his talk with Dr. Opal Sidles, and having to arrange things for his court date for a lawsuit against his sister.   Objective: Vital signs in last 24 hours: Filed Vitals:   02/19/12 0938 02/19/12 1449 02/19/12 2156 02/20/12 0639  BP: 109/78 107/77 92/68 90/72   Pulse: 89 95 85 92  Temp:  98.3 F (36.8 C) 98.3 F (36.8 C) 98.9 F (37.2 C)  TempSrc:  Oral Oral Oral  Resp:  20 20 18   Height:      Weight:    100.517 kg (221 lb 9.6 oz)  SpO2: 97% 93% 96% 92%   Weight change: -0.183 kg (-6.5 oz)  Intake/Output Summary (Last 24 hours) at 02/20/12 0734 Last data filed at 02/20/12 0724  Gross per 24 hour  Intake   1430 ml  Output   3075 ml  Net  -1645 ml   Physical Exam: Gen: alert, oriented, appears anxious, and out of breath after sitting up and lying down for physical exam, interacting appropriately  HEENT: PERRLA, EOMI, MMM, there is some white-to-gray opacities in both eyes (patient said that he was diagnosed with glaucoma 2 years ago), equal hearing bilaterally, supple neck, no lymphadenopathy  CV: irregularly irregular heart rate, no murmurs, no gallops, no thrills, no cyanosis, no peripheral edema  Pulm: increased WOB at baseline, becomes dyspneic when eating, and walking from bed to bathroom, diminished lung sounds, tympanic on percussion, no crackles, no ronchi  Neuro: A&Ox3, CN II-XII grossly intact, 2+ patellar reflex bilaterally, no focal  loss of sensation  MSK: 5/5 UE and LE strength, good range of motion in knee and shoulder  Skin: warm, dry, intact, no rash Lab Results: BMET    Component Value Date/Time   NA 136 02/20/2012 1035   K 3.3* 02/20/2012 1035   CL 100 02/20/2012 1035   CO2 27 02/20/2012 1035   GLUCOSE 131* 02/20/2012 1035   BUN 28* 02/20/2012 1035   CREATININE 2.12* 02/20/2012 1035   CALCIUM 9.6 02/20/2012 1035   GFRNONAA 32* 02/20/2012 1035   GFRAA 37* 02/20/2012 1035    CBC    Component Value Date/Time   WBC 4.6 02/20/2012 1035   RBC 3.74* 02/20/2012 1035   HGB 10.9* 02/20/2012 1035   HCT 32.3* 02/20/2012 1035   PLT 192 02/20/2012 1035   MCV 86.4 02/20/2012 1035   MCH 29.1 02/20/2012 1035   MCHC 33.7 02/20/2012 1035   RDW 13.9 02/20/2012 1035   LYMPHSABS 2.5 02/18/2012 1655   MONOABS 0.5 02/18/2012 1655   EOSABS 0.3 02/18/2012 1655   BASOSABS 0.0 02/18/2012 1655   Glucose: 131  ECG: 02/20/2012 -Irregularly irregular heart rate, 82bpm -Rhythm: atrial fibrillation -PR internal: can't be appreciated -QRS interval: widened, left anterior accessory branch block -Ischemia: There are some T wave inversions in V4 and V5, but not consistent on each beat -No hypertrophy Micro Results: No results found for this or any previous visit (from the past 240 hour(s)). Studies/Results:  Dg Chest 2 View  02/18/2012  *RADIOLOGY REPORT*  Clinical Data: Chest pain, shortness of breath, nausea, vomiting.  CHEST - 2 VIEW  Comparison: 02/03/2012  Findings: Left AICD and right PICC line remain in place, unchanged. Cardiomegaly.  No edema.  No focal opacities or effusions.  No acute bony abnormality.  IMPRESSION: Stable cardiomegaly.  No acute cardiopulmonary disease.   Original Report Authenticated By: Charlett Nose, M.D.    Medications: I have reviewed the patient's current medications. Scheduled Meds: . allopurinol  100 mg Oral Daily  . amiodarone  200 mg Oral BID  . apixaban  5 mg Oral BID  . atorvastatin  10 mg Oral q1800  .  carvedilol  6.25 mg Oral BID WC  . furosemide  80 mg Intravenous TID  . isosorbide-hydrALAZINE  1 tablet Oral TID  . pantoprazole  40 mg Oral Daily  . sodium chloride  3 mL Intravenous Q12H  . spironolactone  12.5 mg Oral Daily   Continuous Infusions: . milrinone 0.25 mcg/kg/min (02/20/12 0647)   PRN Meds:.LORazepam, morphine injection, nitroGLYCERIN, ondansetron (ZOFRAN) IV, ondansetron, sodium chloride, traMADol Assessment/Plan: Principal Problem:   Chest pain Active Problems:   GLUCOSE INTOLERANCE   HYPERCHOLESTEROLEMIA   HYPERTENSION   CARDIOMYOPATHY, DILATED   GASTROESOPHAGEAL REFLUX DISEASE   CHRONIC KIDNEY DISEASE STAGE II (MILD)   Chronic systolic CHF (congestive heart failure), NYHA class 2   Cardiomyopathy, dilated, nonischemic   Atrial fibrillation  [name] is a 62 y.o. 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   1. Chest pain Causes of chest pain and shortness of breath include anxiety, CHF, MSK causes, and ACS. Anxiety is most likely because the patient had initially experienced the chest pain, nausea, and headache after he received that news that he could not undergo cardioversion because of a clot found in his left atrial appendage. He then said that Ativan helps to relieve his symptoms for several hours, which indicates that as his anxiety was treated, his symptoms abated. Please my CHF discussion as a separate point below. MSK causes are possible due to reproducible pain upon palpation, but he has not complained of chest pain when moving arms or torso, and has not suffered any trauma to the chest. Finally, ACS is not likely because of the negative cardiac enzyme labs and no changes on ECG today.   Cardiology has been  consulted because of this persistent chest pain that will not completely resolve even with Ativan. Cardiology cleared patient to be discharged.  -Continue to treat anxiety with Ativan PRN  -Continue atorvastatin 10mg  PO qD  2. NYHA 3IIb/IV heart failure with EF 20-25% CHF is a likely cause of the shortness of breath and chest pain that the patient experiences. Patient has EF of 20-25% and pro-BNP of 3490 chronically. He also has SOB at baseline. He was recently admitted for HF exacerbation and was staged at NYHA 3IIb/IV. However, patient has not shown peripheral edema to suggest a full exacerbation of CHF at this time. When we consulted the heart failure team, they recommended to continue diuresis as patient is still volume overloaded with weight change of only 0.5kg. The plan from HF team is that the only durable option for this patient with RV dysfunction is transplant. However, it is complicated by his social history and renal dysfunction, though  not prohibitive. HF team also wants to give patient torsemide on discharge and may increase milrinone due to the severity of his dyspnea.  -Continue home Lasix 80mg  BID,  -Continue spironolactone 12.5mg  qD  -Continue isosorbide-hydralazine 20-37.5mg  TID  -Continue milrinone infusion 0.44mcg per minute (continued from last hospitalization)  -Fluid/sodium restriction  3. Atrial fibrillation  Patient's atrial fibrillation is rate controlled at 90bpm today with carvedilol and amiodarone at home. He has an ICD placed to prevent ventricular tachycardia. He had a TEE performed on 02/16/12 that showed a clot in the left atrial appendage, thus canceling his schedule cardioversion.  He was treated with warfarin for anticoagulation, but has switched to apixaban on 02/09/2012. He appears to be controlled with ventricular rate of 90bpm. -Continue apixaban 5mg  PO BID -Continue amiodarone 200mg  PO BID  4. HTN Patient's BP decreased from 100's/70's to 90s/70's. This may  be due to the HTN medication that patient has been on, and the furosemide that patient was started on yesterday. Thus, we will stop carvedilol and decrease bidil and monitor for any signs of dizziness or syncope.  - Discontinue carvedilol - Decrease isosorbide-hydralazine 20-37.5mg /tablet from 1 tablet to 0.5 tablet TID  5. Stage III CKD  Patient's Cr is at 2.12, up from 1.83 yesterday. This increase in Cr is most likely due to furosemide use. He has a net balance of -1,366mL yesterday, and his Cr is reflecting this loss of fluid.  -Monitor Cr, continue diuresis   6. Hypokalemia Patient has mild hypokalemia of 3.3 (down from 3.5). This is most likely due to furosemide use. Will replace K as appropriate. -Potassium chloride PO TID  7. Anemia Patient's Hgb decreased to 10.9 today from 11.4 yesterday and 13.4 on admission. Initially, we thought that patient may have just been hemo-concentrated who just had a dilution of hemoglobin with fluid intake. However, patient has had a net -1,332mL given that he is taking furosemide, and the Hgb should be stable or even more concentrated at this point. Patient's Cr is at 2.12, up from 1.83 from yesterday, indicating dehydration, as expected with furosemide use. This change in Hgb is small enough for it to be due to normal lab variation. Thus, we will monitor until tomorrow and ask if patient has noticed blood in stool or any signs of bleeding in AM.   8. Gout No active flare  -continue on allopurinol 100mg  qD   FEN/GI:  -Heart-healthy diet  Proph:  -DVT: not indicated at this time. Patient is on apixaban.   LOS: 2 days   This is a Psychologist, occupational Note.  The care of the patient was discussed with Dr. Virgina Organ and the assessment and plan formulated with their assistance.  Please see their attached note for official documentation of the daily encounter.  Rhea Pink 02/20/2012, 7:34 AM

## 2012-02-20 NOTE — Progress Notes (Addendum)
Advanced Heart Failure Rounding Note   Subjective:    [name] is a 62 y.o. gentlemen with multiple [name] admissions for HF exacerbations. 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 He also has history of ventricular tachycardia s/p St Jude ICD by [name], chronic kidney disease: stage III, baseline Cr ~2, and paroxysmal atrial arrhythmias controlled by amiodarone. As well as sarcoidosis, hypertension. Cath in 2008 by Dr. [name] showed no CAD.   On home milrinone 0.25 mcg/kg/min for low output HF.  Started on apixaban and amio for atrial fib.  Underwent TEE showing LAA clot therefore no DCCV.   Admitted for chest pain and weight gain of 3 pounds.  Troponin neg x3.  Started on IV lasix yesterday.  Weight down 1 pound, -1.3 liters  BMET pending. No further CP.     Objective:   Weight Range:  Vital Signs:   Temp:  [98.3 F (36.8 C)-98.9 F (37.2 C)] 98.9 F (37.2 C) (02/18 0639) Pulse Rate:  [85-95] 92 (02/18 0639) Resp:  [18-20] 18 (02/18 0639) BP: (90-109)/(68-78) 90/72 mmHg (02/18 0639) SpO2:  [92 %-97 %] 92 % (02/18 0639) Weight:  [221 lb 9.6 oz (100.517 kg)] 221 lb 9.6 oz (100.517 kg) (02/18 0639) Last BM Date: 02/18/12  Weight change: Filed Weights   02/18/12 2321 02/19/12 0623 02/20/12 0639  Weight: 222 lb 0.1 oz (100.7 kg) 222 lb 10.6 oz (101 kg) 221 lb 9.6 oz (100.517 kg)    Intake/Output:   Intake/Output Summary (Last 24 hours) at 02/20/12 0842 Last data filed at 02/20/12 0724  Gross per 24 hour  Intake   1430 ml  Output   3075 ml  Net  -1645 ml     Physical Exam: General:  Well appearing. No resp difficulty HEENT: normal Neck: supple. JVP 8 . Carotids 2+ bilat; no bruits. No lymphadenopathy or thryomegaly appreciated. Cor: PMI nondisplaced. Irreg Abdomen: soft, nontender, nondistended. No hepatosplenomegaly. No bruits or masses. Good bowel  sounds. Extremities: no cyanosis, clubbing, rash, edema Neuro: alert & orientedx3, cranial nerves grossly intact. moves all 4 extremities w/o difficulty. Affect pleasant   Labs: Basic Metabolic Panel:  Recent Labs Lab 02/18/12 1723 02/19/12 0430  NA 144 142  K 3.5 3.5  CL 113* 108  CO2  --  24  GLUCOSE 87 95  BUN 31* 28*  CREATININE 2.20* 1.83*  CALCIUM  --  9.7    Liver Function Tests:  Recent Labs Lab 02/18/12 2210 02/19/12 0430  AST 32 30  ALT 28 26  ALKPHOS 71 66  BILITOT 0.5 0.6  PROT 7.7 7.2  ALBUMIN 3.1* 3.1*    Recent Labs Lab 02/18/12 2210  LIPASE 46   No results found for this basename: AMMONIA,  in the last 168 hours  CBC:  Recent Labs Lab 02/18/12 1655 02/18/12 1723 02/19/12 0430  WBC 6.0  --  5.9  NEUTROABS 2.8  --   --   HGB 13.4 13.9 11.4*  HCT 40.2 41.0 35.0*  MCV 88.0  --  87.9  PLT 262  --  214    Cardiac Enzymes:  Recent Labs Lab 02/18/12 2210 02/19/12 0430 02/19/12 0826  TROPONINI <0.30 <0.30 <0.30    BNP: BNP (last 3 results)  Recent Labs  01/30/12 0326 02/03/12 0500 02/18/12 2210  PROBNP 2314.0* 3722.0* 3490.0*     Other results:  Imaging: Dg Chest 2 View  02/18/2012  *RADIOLOGY REPORT*  Clinical Data: Chest pain, shortness of breath, nausea, vomiting.  CHEST - 2 VIEW  Comparison: 02/03/2012  Findings: Left AICD and right PICC line remain in place, unchanged. Cardiomegaly.  No edema.  No focal opacities or effusions.  No acute bony abnormality.  IMPRESSION: Stable cardiomegaly.  No acute cardiopulmonary disease.   Original Report Authenticated By: Charlett Nose, M.D.       Medications:     Scheduled Medications: . allopurinol  100 mg Oral Daily  . amiodarone  200 mg Oral BID  . apixaban  5 mg Oral BID  . atorvastatin  10 mg Oral q1800  . carvedilol  6.25 mg Oral BID WC  . furosemide  80 mg Intravenous TID  . isosorbide-hydrALAZINE  1 tablet Oral TID  . pantoprazole  40 mg Oral Daily  . sodium  chloride  3 mL Intravenous Q12H  . spironolactone  12.5 mg Oral Daily     Infusions: . milrinone 0.25 mcg/kg/min (02/20/12 0647)     PRN Medications:  LORazepam, morphine injection, nitroGLYCERIN, ondansetron (ZOFRAN) IV, ondansetron, sodium chloride, traMADol   Assessment:   1. A/C systolic heart failure EF 20%  --with multiple readmissions  2. NICM  3. Chronic renal failure  4. H/o VT s/p ICD  5. 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 Chronic AF  7. Chest pain   Plan/Discussion:    Still with volume overload. Would continue IV lasix.   Await co-ox. Given severe RV dysfunction only durable option is transplant. However this is complicated by his social history and renal dysfunction - though I don't think this is prohibitive.   Length of Stay: 2   [name], [name] 02/20/2012, 8:42 AM  Patient seen and examined with Darrell Blossom, PA-C. We discussed all aspects of the encounter. I agree with the assessment and plan as stated above. I have edited note above with my changes. Continue diuresis. Recheck co-ox. Continue transplant w/u. On discharge would change to demadex. May need to increase milrinone.   BP is low. Will cut carvedilol and bidil back. Not on ACE-I due to low BP and renal dysfunction.   Darrell Angert,MD 10:46 AM

## 2012-02-21 LAB — BASIC METABOLIC PANEL
BUN: 30 mg/dL — ABNORMAL HIGH (ref 6–23)
Calcium: 9.8 mg/dL (ref 8.4–10.5)
Creatinine, Ser: 2.21 mg/dL — ABNORMAL HIGH (ref 0.50–1.35)
GFR calc non Af Amer: 30 mL/min — ABNORMAL LOW (ref >90)
Glucose, Bld: 87 mg/dL (ref 70–99)
Sodium: 139 mEq/L (ref 135–145)

## 2012-02-21 MED ORDER — NITROGLYCERIN 0.4 MG SL SUBL: 0.4000 mg | SUBLINGUAL_TABLET | SUBLINGUAL | Status: DC | PRN

## 2012-02-21 MED ORDER — CARVEDILOL 3.125 MG PO TABS: 3.1250 mg | ORAL_TABLET | Freq: Two times a day (BID) | ORAL | Status: DC

## 2012-02-21 MED ORDER — KETOROLAC TROMETHAMINE 10 MG PO TABS: 20.0000 mg | ORAL_TABLET | Freq: Once | ORAL | Status: DC

## 2012-02-21 MED ORDER — ISOSORB DINITRATE-HYDRALAZINE 20-37.5 MG PO TABS: 0.5000 | ORAL_TABLET | Freq: Three times a day (TID) | ORAL | Status: DC

## 2012-02-21 MED ORDER — TORSEMIDE 20 MG PO TABS: ORAL_TABLET | ORAL | Status: DC

## 2012-02-21 NOTE — Progress Notes (Addendum)
Advanced Heart Failure Rounding Note   Subjective:    [name] is a 62 y.o. gentlemen with multiple hospital admissions for HF exacerbations. 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 He also has history of ventricular tachycardia s/p St Jude ICD by [name], chronic kidney disease: stage III, baseline Cr ~2, and paroxysmal atrial arrhythmias controlled by amiodarone. As well as sarcoidosis, hypertension. Cath in 2008 by Dr. [name] showed no CAD.   On home milrinone 0.25 mcg/kg/min for low output HF.  Started on apixaban and amio for atrial fib.  Underwent TEE showing LAA clot therefore no DCCV.   Admitted for chest pain and weight gain of 3 pounds.  Troponin neg x3.  Started on IV lasix yesterday.  Weight only down 1 pound since admit but Cr climbing, 2.2>1.8>2.12>2.21.  Co-ox yesterday 79% on milrinone 0.25 mcg/kg/min.   Objective:     Vital Signs:   Temp:  [97.1 F (36.2 C)-97.7 F (36.5 C)] 97.7 F (36.5 C) (02/19 0630) Pulse Rate:  [73-95] 82 (02/19 0633) Resp:  [18-20] 20 (02/19 0500) BP: (84-102)/(64-80) 96/79 mmHg (02/19 0633) SpO2:  [91 %-100 %] 96 % (02/19 0500) Weight:  [221 lb 5.5 oz (100.4 kg)] 221 lb 5.5 oz (100.4 kg) (02/19 0500) Last BM Date: 02/20/12  Weight change: Filed Weights   02/19/12 0623 02/20/12 0639 02/21/12 0500  Weight: 222 lb 10.6 oz (101 kg) 221 lb 9.6 oz (100.517 kg) 221 lb 5.5 oz (100.4 kg)    Intake/Output:   Intake/Output Summary (Last 24 hours) at 02/21/12 0945 Last data filed at 02/21/12 0850  Gross per 24 hour  Intake 1726.88 ml  Output   1580 ml  Net 146.88 ml     Physical Exam: General:  Well appearing. No resp difficulty HEENT: normal Neck: supple. JVP 7-8 . Carotids 2+ bilat; no bruits. No lymphadenopathy or thryomegaly appreciated. Cor: PMI nondisplaced. Irreg Abdomen: obese, soft, nontender, nondistended. No  hepatosplenomegaly. No bruits or masses. Good bowel sounds. Extremities: no cyanosis, clubbing, rash, edema Neuro: alert & orientedx3, cranial nerves grossly intact. moves all 4 extremities w/o difficulty. Affect pleasant   Labs: Basic Metabolic Panel:  Recent Labs Lab 02/18/12 1723 02/19/12 0430 02/20/12 1035 02/21/12 0500  NA 144 142 136 139  K 3.5 3.5 3.3* 3.9  CL 113* 108 100 104  CO2  --  24 27 27   GLUCOSE 87 95 131* 87  BUN 31* 28* 28* 30*  CREATININE 2.20* 1.83* 2.12* 2.21*  CALCIUM  --  9.7 9.6 9.8    Liver Function Tests:  Recent Labs Lab 02/18/12 2210 02/19/12 0430  AST 32 30  ALT 28 26  ALKPHOS 71 66  BILITOT 0.5 0.6  PROT 7.7 7.2  ALBUMIN 3.1* 3.1*    Recent Labs Lab 02/18/12 2210  LIPASE 46   No results found for this basename: AMMONIA,  in the last 168 hours  CBC:  Recent Labs Lab 02/18/12 1655 02/18/12 1723 02/19/12 0430 02/20/12 1035  WBC 6.0  --  5.9 4.6  NEUTROABS 2.8  --   --   --   HGB 13.4 13.9 11.4* 10.9*  HCT 40.2 41.0 35.0* 32.3*  MCV 88.0  --  87.9 86.4  PLT 262  --  214 192    Cardiac Enzymes:  Recent Labs Lab 02/18/12 2210 02/19/12 0430 02/19/12 0826  TROPONINI <0.30 <0.30 <0.30    BNP: BNP (last 3 results)  Recent Labs  01/30/12 0326 02/03/12 0500 02/18/12 2210  PROBNP 2314.0* 3722.0* 3490.0*     Other results:  Imaging: No results found.   Medications:     Scheduled Medications: . allopurinol  100 mg Oral Daily  . amiodarone  200 mg Oral BID  . apixaban  5 mg Oral BID  . atorvastatin  10 mg Oral q1800  . carvedilol  3.125 mg Oral BID WC  . furosemide  80 mg Intravenous BID  . isosorbide-hydrALAZINE  0.5 tablet Oral TID  . pantoprazole  40 mg Oral Daily  . potassium chloride  40 mEq Oral TID  . sodium chloride  3 mL Intravenous Q12H  . spironolactone  12.5 mg Oral Daily    Infusions: . milrinone 0.25 mcg/kg/min (02/20/12 2012)    PRN Medications: LORazepam, nitroGLYCERIN,  ondansetron (ZOFRAN) IV, ondansetron, sodium chloride, traMADol   Assessment:   1. A/C systolic heart failure EF 20%  --with multiple readmissions  2. NICM  3. Chronic renal failure  4. H/o VT s/p ICD  5. 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 Chronic AF  7. Chest pain  Plan/Discussion:    Volume status stable and Cr at baseline.  Will change lasix to torsemide for better volume control.  Have re-educated on low sodium diet and daily weights.    Will discuss further transplant evaluation at [facility].    Remains in Afib.  Will plan for repeat TEE and DCCV in 4 weeks as outpatient  HF discharge meds: 1. Amiodarone 200 mg BID 2. Carvedilol 3.125 mg BID 3. bidil 0.5 tab TID 4. apixaban 5 mg BID 5. Spiro 12.5 mg daily  6. Torsemide 40/20 mg   Length of Stay: 3  Robbi Garter, [facility] 02/21/2012, 9:45 AM  Patient seen and examined with Ulyess Blossom, PA-C. We discussed all aspects of the encounter. I agree with the assessment and plan as stated above.   Volume status looks good. CR up slightly. Long talk about his situation and borderline eligibility for VAD/transplant give RV dysfunction and CRI. Will discuss with [facility] transplant team.   OK for d/c today with very close f/u in HF clinic. Continue milrinone.   Daniel Bensimhon,MD 3:03 PM

## 2012-02-21 NOTE — Progress Notes (Signed)
I have reviewed and agree with Medical student Rhea Pink. Please see my separate note .   Signed: Darden Palmer, MD PGY-I, Internal Medicine Resident Pager: [phone]  02/21/2012,1:32 PM

## 2012-02-21 NOTE — Progress Notes (Signed)
SATURATION QUALIFICATIONS: (This note is used to comply with regulatory documentation for home oxygen)  Patient Saturations on Room Air at Rest = 93%  Patient Saturations on Room Air while Ambulating = 85%  Patient Saturations on 3 Liters of oxygen while Ambulating = 94%  Please briefly explain why patient needs home oxygen:Patient SOB while ambulating.

## 2012-02-21 NOTE — Progress Notes (Signed)
Medical Student Daily Progress Note  Subjective: [name] had two more episodes of brief chest pain that resolved with pain medications. He says that he is tired of being short of breath when he eats and walking around, and he is tired of never having this problem resolved and having to come back to the hospital. However, when told that he will be discharged today unless HF team says otherwise, patient says that he feel comfortable going home with his mom monitoring him.   Objective: Vital signs in last 24 hours: Filed Vitals:   02/20/12 2116 02/21/12 0500 02/21/12 0630 02/21/12 0633  BP: 102/72 93/78  96/79  Pulse: 87 85  82  Temp: 97.1 F (36.2 C) 97.7 F (36.5 C) 97.7 F (36.5 C)   TempSrc: Oral Oral Oral   Resp: 20 20    Height:      Weight:  100.4 kg (221 lb 5.5 oz)    SpO2: 95% 96%     Weight change: -0.117 kg (-4.1 oz)  Intake/Output Summary (Last 24 hours) at 02/21/12 0655 Last data filed at 02/21/12 0150  Gross per 24 hour  Intake   1202 ml  Output   1480 ml  Net   -278 ml   Physical Exam: Gen: alert, oriented, appears anxious, and out of breath after sitting up and lying down for physical exam, interacting appropriately  HEENT: PERRLA, EOMI, MMM, there is some white-to-gray opacities in both eyes (patient said that he was diagnosed with glaucoma 2 years ago), equal hearing bilaterally, supple neck, no lymphadenopathy  CV: irregularly irregular heart rate, no murmurs, no gallops, no thrills, no cyanosis, no peripheral edema  Pulm: increased WOB at baseline, becomes dyspneic when eating, and walking from bed to bathroom, diminished lung sounds, tympanic on percussion, no crackles, no ronchi  Neuro: A&Ox3, CN II-XII grossly intact, 2+ patellar reflex bilaterally, no focal loss of sensation  MSK: 5/5 UE and LE strength, good range of motion in knee and shoulder  Skin: warm, dry, intact, no rash Lab Results: BMET    Component Value Date/Time   NA 139 02/21/2012 0500    K 3.9 02/21/2012 0500   CL 104 02/21/2012 0500   CO2 27 02/21/2012 0500   GLUCOSE 87 02/21/2012 0500   BUN 30* 02/21/2012 0500   CREATININE 2.21* 02/21/2012 0500   CALCIUM 9.8 02/21/2012 0500   GFRNONAA 30* 02/21/2012 0500   GFRAA 35* 02/21/2012 0500    Glucose: 131  ECG: 02/21/2012 Pending  02/20/2012 -Irregularly irregular heart rate, 82bpm -Rhythm: atrial fibrillation -PR internal: can't be appreciated -QRS interval: widened, left anterior accessory branch block -Ischemia: There are some T wave inversions in V4 and V5, but not consistent on each beat -No hypertrophy Micro Results: No results found for this or any previous visit (from the past 240 hour(s)). Studies/Results: No results found. Medications: I have reviewed the patient's current medications. Scheduled Meds: . allopurinol  100 mg Oral Daily  . amiodarone  200 mg Oral BID  . apixaban  5 mg Oral BID  . atorvastatin  10 mg Oral q1800  . carvedilol  3.125 mg Oral BID WC  . furosemide  80 mg Intravenous BID  . isosorbide-hydrALAZINE  0.5 tablet Oral TID  . pantoprazole  40 mg Oral Daily  . potassium chloride  40 mEq Oral TID  . sodium chloride  3 mL Intravenous Q12H  . spironolactone  12.5 mg Oral Daily   Continuous Infusions: . milrinone 0.25 mcg/kg/min (02/20/12 2012)  PRN Meds:.LORazepam, nitroGLYCERIN, ondansetron (ZOFRAN) IV, ondansetron, sodium chloride, traMADol Assessment/Plan: Principal Problem:   Chest pain Active Problems:   GLUCOSE INTOLERANCE   HYPERCHOLESTEROLEMIA   HYPERTENSION   CARDIOMYOPATHY, DILATED   GASTROESOPHAGEAL REFLUX DISEASE   CHRONIC KIDNEY DISEASE STAGE II (MILD)   Chronic systolic CHF (congestive heart failure), NYHA class 2   Cardiomyopathy, dilated, nonischemic   Atrial fibrillation  [name] is a 62 y.o. 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   1. Chest pain Causes of chest pain and shortness of breath include anxiety, CHF, MSK causes, and ACS. Anxiety is most likely because the patient had initially experienced the chest pain, nausea, and headache after he received that news that he could not undergo cardioversion because of a clot found in his left atrial appendage. He then said that Ativan helps to relieve his symptoms for several hours, which indicates that as his anxiety was treated, his symptoms abated. Please my CHF discussion as a separate point below. MSK causes are possible due to reproducible pain upon palpation, but he has not complained of chest pain when moving arms or torso, and has not suffered any trauma to the chest. Finally, ACS is not likely because of the negative cardiac enzyme labs and no changes on ECGs thus far. Patient will follow up with cardiology, Dr. Montey Hora, after discharge.  -Continue to treat anxiety with Ativan PRN  -Continue atorvastatin 10mg  PO qD  2. NYHA 3IIb/IV heart failure with EF 20-25% CHF is a likely cause of the shortness of breath and chest pain that the patient experiences. Patient has EF of 20-25% and pro-BNP of 3490 chronically. He also has SOB at baseline. He was recently admitted for HF exacerbation and was staged at NYHA 3IIb/IV. However, patient has not shown peripheral edema to suggest a full exacerbation of CHF at this time. When we consulted the heart failure team, they recommended to continue diuresis as patient is still volume overloaded with weight change of only 0.5kg. HF team states that the only durable option for this patient with RV dysfunction is transplant. However, it is complicated by his social history and renal dysfunction, though not prohibitive. HF team also wants to give patient torsemide on discharge and may increase milrinone due to the  severity of his dyspnea. -Continue home Lasix 80mg  BID,  -Continue spironolactone 12.5mg  qD  -Continue milrinone infusion 0.29mcg per minute (continued from last hospitalization)  -Fluid/sodium restriction  3. Atrial fibrillation  Patient's atrial fibrillation is rate controlled at 90bpm today with carvedilol and amiodarone at home. He has an ICD placed to prevent ventricular tachycardia. He had a TEE performed on 02/16/12 that showed a clot in the left atrial appendage, thus canceling his schedule cardioversion.  He was treated with warfarin for anticoagulation, but has switched to apixaban on 02/09/2012. He appears to be controlled with ventricular rate of 90bpm. -Continue apixaban 5mg  PO BID -Continue amiodarone 200mg  PO BID  4. HTN Patient's BP is now stable in the 100's/70's. Will continue to monitor HTN, dizziness, and lightheadedness. - Continue on current dose of carvedilol - Continue isosorbide-hydralazine 20-37.5mg /tablet  0.5 tablet TID  5. Stage III CKD  Patient's Cr is trending up to 2.21 from 2.12 yesterday. This increase in Cr is most likely due to furosemide use. He has a net balance  of - yesterday, and -1,365mL the day before, and his Cr is reflecting this loss of fluid.  -Monitor Cr, continue diuresis   6. Hypokalemia Patient's potassium level has returned to 3.9 from 3.3 yesterday. The brief episode of hypokalemia is most likely due to furosemide use. Will monitor K levels for now and replace as necessary.  -Potassium chloride PO TID  7. Anemia Not concerning today because patient's Hgb dropped after given fluids, and Hgb was stable. Patient has not complained of blood in stool. No CBC was obtained today because it is likely due to fluid dilution rather than blood loss.  8. Gout No active flare  -continue on allopurinol 100mg  qD   FEN/GI:  -Heart-healthy diet  Proph:  -DVT: not indicated at this time. Patient is on apixaban.  Dispo: -Discharge depends  on HF recommendations for patient. Ready for discharge on the part of med team.    LOS: 3 days   This is a Psychologist, occupational Note.  The care of the patient was discussed with Dr. Virgina Organ and the assessment and plan formulated with their assistance.  Please see their attached note for official documentation of the daily encounter.  Rhea Pink 02/21/2012, 6:55 AM

## 2012-02-21 NOTE — Progress Notes (Signed)
Went over all D/C info with Pt. Aware of home meds, follow up appts, and when to call MD. Pt went home on Milrinone IV and Home 02. [facility] will follow pt. Pt has all belongings. D/C per W/C accompanied by RN to friend with private car. Marland Kitchen

## 2012-02-21 NOTE — Discharge Summary (Deleted)
Physician Discharge Summary  Patient ID: [name] MRN: [id number] DOB/AGE: 03-25-1950 62 y.o.  Admit date: 02/18/2012 Discharge date: 02/21/2012  Admission Diagnoses:  Chest pain likely due to anxiety Atrial fibrillation Non-ischemic dilated cardiomyopathy NYHA III/IV heart failure with EF 20-25% Stage III CKD HTN Gout Sarcoidosis GERD  Discharge Diagnoses:  Anxiety NYHA III/IV heart failure with EF 20-25% Non-ischemic dilated cardiomyopathy Acute renal failure: furosemide use Hyponatremia (resolved) Atrial fibrillation Stage III CKD HTN Gout Sarcoidosis GERD  Procedures performed 1. ECGs: atrial fibrillation, LAD, non specific T wave changes, prolonged QTc ([id number]), incomplete LBBB, no significant changes since ECG from 01/28/2012. 2. CXR (02/18/2012): no change in cardiomegaly, no acute cardiovascular changes  Consultations Heart Failure team: Dr. 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  [name] reports that on Friday night, he had an episode of dizziness, "needles in his chest,"and nausea. In addition to the needlelike sensation in his chest, he reports left neck pain that radiates into his left shoulder and left chest. He took Xanax because he felt that symptoms represented a panic attack, which she's had in the past. He reports that Xanax did not help his symptoms, and he began to feel like his heart was racing and then slowing down. He is concerned that his atrial fibrillation was worsening. He said he slept poorly overnight due to anxiety. The following day, Saturday, he had to repeat similar episodes. With these episodes, he also  had emesis of stomach contents.  He denies any worsening of his baseline dyspnea or orthopnea. He denies any PND. He reports that he usually swells in his hands and abdominal wall when he is volume overloaded, but reports he has not noticed any increase in the swelling.  [name] was most recently admitted to cardiology service here on 01-25-12 stage 3/4 NYHA heart failure and acute on chronic renal failure. He had a right heart cath performed at that time notable for low cardiac output. He was diuresed with Lasix and discharged on milrinone.  Since his hospital follow, he's had one office visit with Dr.Behnsimhon on 02/09/12. At this visit, his INR was subtherapeutic, Coumadin was discontinued, and he was started on apixiban for anticoagulation. A TEE with possible cardioversion was scheduled for 02-16-12; however cardioversion was not completed during the procedure secondary to clot in left atrial appendage.  [name] reports that after the procedure on Friday he was feeling "great, "until he learned of clot found on TEE. He said this made him very upset.  He reports several nosebleeds over the past few days. He has had these in the past. Hemostasis achieved quickly with pressure. He has had a couple of days of cough, productive of clear sputum.  Denies syncope, fevers, chills, abdominal pain, diarrhea, constipation, hematochezia, dysuria, hematuria, rash, joint pain.     Admitting Labs Basic Metabolic Panel:   Recent Labs   02/18/12 1723   NA  144   K  3.5   CL  113*   GLUCOSE  87   BUN  31*   CREATININE  2.20*    CBC:   Recent Labs   02/18/12 1655  02/18/12 1723   WBC  6.0  --   NEUTROABS  2.8  --  HGB  13.4  13.9   HCT  40.2  41.0   MCV  88.0  --   PLT  262  --    Urine Drug Screen:  Drugs of Abuse    Component  Value  Date/Time    LABOPIA  NONE DETECTED  02/09/2012 1213    COCAINSCRNUR  NONE DETECTED  02/09/2012 1213    LABBENZ  NONE DETECTED  02/09/2012 1213    AMPHETMU   NONE DETECTED  02/09/2012 1213    THCU  NONE DETECTED  02/09/2012 1213    LABBARB  NONE DETECTED  02/09/2012 1213   Imaging results:  Dg Chest 2 View  02/18/2012 *RADIOLOGY REPORT* Clinical Data: Chest pain, shortness of breath, nausea, vomiting. CHEST - 2 VIEW Comparison: 02/03/2012 Findings: Left AICD and right PICC line remain in place, unchanged. Cardiomegaly. No edema. No focal opacities or effusions. No acute bony abnormality. IMPRESSION: Stable cardiomegaly. No acute cardiopulmonary disease. Original Report Authenticated By: Charlett Nose, M.D.   Other results:  EKG: Atrial fibrillation with rate control (99), LAD, prolonged QTc ( ), incomplete LBBB. Compared to prior 01/28/12, no signficicant changes.    Hospital Course:  1. Chest pain Patient most likely had anxiety induced chest pain as well as CHF induced chest pain. Patient's chest pain symptoms were relieved with Ativan and tramadol, and patient appeared anxious. Patient also has severe CHF that can cause chest pain. Please see my discussion of CHF below. Patient had negative troponin I x 3, non-concerning CXR with stable cardiomegaly and no pulmonary edema. His initial ECG showed nonspecific T wave abnormalities in leads V4 and V5 that were not present with each beat, and appeared to be resolved on consequent ECGs. There was no evidence of ST elevation or depression. Thus, ACS was ruled out. PE was considered a possibility with Geneva score of 5 for tachycardia, but that can be explained by atrial fibrillation, chronic dyspnea, and pain. MSK causes are considered because patient had pain on palpation, and his pain is controlled with tramadol. Finally, patient's UDS was negative for cocaine at this admission consistent with 2 prior screenings since November, when he reported using cocaine.   2. NYHA III/IV heart failure with EF 20-25% CHF is a likely cause of the shortness of breath and chest pain that the patient experiences. Patient has EF  of 20-25% and pro-BNP of 3490, which was also present on last admission in 01/27/2012.  He was recently admitted for HF exacerbation and was staged at NYHA 3IIb/IV. However, patient has not shown peripheral edema to suggest a full exacerbation of CHF at this time. When we consulted the heart failure team, they recommended to continue diuresis (furosemide 80mg  BID) because they believe that patient is volume overloaded. Patient diuresed -1,636mL on the first day, but only - on the second day. HF team said that the only durable option for this patient with RV dysfunction is transplant. However, it is complicated by his social history and renal dysfunction, though not prohibitive. HF team also recommended starting torsemide on discharge and increase milrinone due to the severity of his dyspnea. Otherwise, patient continued on home medication of spironolactone, and milrinone.  3. Atrial fibrillation Patient is rate controlled during this admission with rates between 72-100. He continued to take apixaban, and amiodarone. His carvedilol dosage was cut in half (see HTN section for reasoning).  4. HTN Patient had low BP in the 90's/70's during diuresis. Thus, we decreased isosorbide-hydralazine and carvedilol dosages by half, and patient's  BP increased to 100's/70's.   5. Stage III CKD Patient came in with Cr of 1.83, which is near his baseline. His Cr. increased to 2.21 during hospitalization, most likely due to furosimide use. Patient was given fluids after his Cr increased.  6. Hypokalemia Hypokalemia was caused by furosemide diuresis and is resolved with potassium chloride replacement.   7. Anemia Patient had a Hgb drop of 13.4 to 10.9 during admission, but it was likely caused by serum dilution with fluids given on admission and the day after admissions. Patient did not report any blood in stool.   8. Gout No active flare during this admission. Patient continued on allopurinol.   Discharge  Exam: Blood pressure 96/79, pulse 82, temperature 97.7 F (36.5 C), temperature source Oral, resp. rate 20, height 6\' 3"  (1.905 m), weight 100.4 kg (221 lb 5.5 oz), SpO2 96.00%. Gen: alert, orientedx3, appears anxious, not in acute distress, interacting appropriately  HEENT: PERRLA, EOMI, MMM, there is some white-to-gray opacities in both eyes (patient said that he was diagnosed with glaucoma 2 years ago), equal hearing bilaterally, supple neck, no lymphadenopathy  CV: irregularly irregular heart rate, no murmurs, no gallops, no thrills, no cyanosis, no peripheral edema  Pulm: increased WOB at baseline, becomes dyspneic when eating, and walking from bed to bathroom, diminished lung sounds, tympanic on percussion, no crackles, no ronchi  Neuro: A&Ox3, CN II-XII grossly intact, 2+ patellar reflex bilaterally, no focal loss of sensation  MSK: 5/5 UE and LE strength, good range of motion in knee and shoulder  Skin: warm, dry, intact, no rash  Disposition: 01-Home or Self Care   Future Appointments Provider Department Dept Phone   03/01/2012 9:30 AM [facility] [phone]   03/11/2012 9:15 AM [facility] [phone]       Medication List    ASK your doctor about these medications       allopurinol 100 MG tablet  Commonly known as:  ZYLOPRIM  Take 1 tablet (100 mg total) by mouth daily.     amiodarone 200 MG tablet  Commonly known as:  PACERONE  Take 1 tablet (200 mg total) by mouth 2 (two) times daily.     apixaban 5 MG Tabs tablet  Commonly known as:  ELIQUIS  Take 1 tablet (5 mg total) by mouth 2 (two) times daily.     carvedilol 3.125 MG tablet  Commonly known as:  COREG  Take 2 tablets (6.25 mg total) by mouth 2 (two) times daily.     furosemide 80 MG tablet  Commonly known as:  LASIX  Take 1 tablet (80 mg total) by mouth 2 (two) times daily.     isosorbide-hydrALAZINE  20-37.5 MG per tablet  Commonly known as:  BIDIL  Take 1 tablet by mouth 3 (three) times daily.     LORazepam 1 MG tablet  Commonly known as:  ATIVAN  Take 0.5 tablets (0.5 mg total) by mouth 2 (two) times daily as needed for anxiety.     milrinone 20 MG/100ML Soln infusion  Commonly known as:  PRIMACOR  Inject 24.875 mcg/min into the vein continuous.     ondansetron 8 MG disintegrating tablet  Commonly known as:  ZOFRAN-ODT  Take 8 mg by mouth every 8 (eight) hours as needed. 8mg  ODT q4 hours prn nausea     pantoprazole 40 MG tablet  Commonly known as:  PROTONIX  Take 40 mg by mouth  daily.     polyethylene glycol powder powder  Commonly known as:  GLYCOLAX/MIRALAX  Take 17 g by mouth 2 (two) times daily. Until daily soft stools    OTC     rosuvastatin 5 MG tablet  Commonly known as:  CRESTOR  Take 1 tablet (5 mg total) by mouth at bedtime.     spironolactone 25 MG tablet  Commonly known as:  ALDACTONE  Take 0.5 tablets (12.5 mg total) by mouth daily.           Follow-up Information   Follow up with Robynn Pane, MD On 02/23/2012. (@415pm )    Contact information:   [address] [place] [address] [phone]       Signed: Rhea Pink 02/21/2012, 9:44 AM

## 2012-02-21 NOTE — Progress Notes (Addendum)
Subjective: [name] was seen and examined at bedside this morning.  He was lying  in bed but still complains of occasional chest pain and shortness of breath that is no different from before.  He is being followed heart failure team.  He claims the pain improves occasionally with ativan and pain medication.  He denies any fever, chills, N/V/D, abdominal pain, or any urinary complaints at this time.  Net negative 1.4L since admission.  He does endorse that he is currently in the middle of some family issues and having to go to court with his sister.    Objective: Vital signs in last 24 hours: Filed Vitals:   02/20/12 2116 02/21/12 0500 02/21/12 0630 02/21/12 0633  BP: 102/72 93/78  96/79  Pulse: 87 85  82  Temp: 97.1 F (36.2 C) 97.7 F (36.5 C) 97.7 F (36.5 C)   TempSrc: Oral Oral Oral   Resp: 20 20    Height:      Weight:  221 lb 5.5 oz (100.4 kg)    SpO2: 95% 96%     Weight change: -4.1 oz (-0.117 kg)  Intake/Output Summary (Last 24 hours) at 02/21/12 0713 Last data filed at 02/21/12 0150  Gross per 24 hour  Intake   1202 ml  Output   1480 ml  Net   -278 ml   Vitals reviewed. General: sitting up on side of bed, anxious HEENT: PERRLA, EOMI, no scleral icterus Cardiac: Irregularly irregular, +2/6 SEM, loudest at apex, +ICD L upper chest. Tenderness to palpation of midsternal chest.    Pulm: clear to auscultation bilaterally, no wheezes, rales, or rhonchi Abd: soft, nontender, nondistended, BS present Ext: warm and well perfused, no pedal edema, +2 dp b/l, R PICC in place.  Neuro: alert and oriented X3, cranial nerves II-XII grossly intact, strength and sensation to light touch equal in bilateral upper and lower extremities  Lab Results: Basic Metabolic Panel:  Recent Labs Lab 02/20/12 1035 02/21/12 0500  NA 136 139  K 3.3* 3.9  CL 100 104  CO2 27 27  GLUCOSE 131* 87  BUN 28* 30*  CREATININE 2.12* 2.21*  CALCIUM 9.6 9.8   Liver Function Tests:  Recent  Labs Lab 02/18/12 2210 02/19/12 0430  AST 32 30  ALT 28 26  ALKPHOS 71 66  BILITOT 0.5 0.6  PROT 7.7 7.2  ALBUMIN 3.1* 3.1*    Recent Labs Lab 02/18/12 2210  LIPASE 46   CBC:  Recent Labs Lab 02/18/12 1655  02/19/12 0430 02/20/12 1035  WBC 6.0  --  5.9 4.6  NEUTROABS 2.8  --   --   --   HGB 13.4  < > 11.4* 10.9*  HCT 40.2  < > 35.0* 32.3*  MCV 88.0  --  87.9 86.4  PLT 262  --  214 192  < > = values in this interval not displayed. Cardiac Enzymes:  Recent Labs Lab 02/18/12 2210 02/19/12 0430 02/19/12 0826  TROPONINI <0.30 <0.30 <0.30   BNP:  Recent Labs Lab 02/18/12 2210  PROBNP 3490.0*   Medications: I have reviewed the patient's current medications. Scheduled Meds: . allopurinol  100 mg Oral Daily  . amiodarone  200 mg Oral BID  . apixaban  5 mg Oral BID  . atorvastatin  10 mg Oral q1800  . carvedilol  3.125 mg Oral BID WC  . furosemide  80 mg Intravenous BID  . isosorbide-hydrALAZINE  0.5 tablet Oral TID  . pantoprazole  40 mg Oral  Daily  . potassium chloride  40 mEq Oral TID  . sodium chloride  3 mL Intravenous Q12H  . spironolactone  12.5 mg Oral Daily   Continuous Infusions: . milrinone 0.25 mcg/kg/min (02/20/12 2012)   PRN Meds:.LORazepam, nitroGLYCERIN, ondansetron (ZOFRAN) IV, ondansetron, sodium chloride, traMADol Assessment/Plan: Mr. Ohms is a 62 y.o. 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, CKD stage III, and paroxysmal atrial arrhythmias controlled by amiodarone. As well as sarcoidosis, hypertension. Cath in 2008 by Dr. [name] showed no CAD.   1) Chest pain--likely not ischemic, possibly secondary to volume overload in setting of chronic CHF and provoked by anxiety.  Presented with 3 days of episodic "needle-like" chest pain, anxiety, and nausea, pain radiating from left  shoulder but no worsening of baseline dyspnea. EKG without ischemic changes and no change from prior. Symptoms are episodic, and appeared be provoked by anxiety in setting of atrial fibrillation. He's had similar symptoms with panic attacks in the past. He may have had rapid ventricular rate at home which resolved upon admission?  I discussed with Dr. [name] on the phone 2/17, he knows [name] well and does not think his pain is ischemic in origin, especially with negative enzymesx3 and unchanged EKG.  He recommends discharge at this time, control of anxiety, and follow up with him as an outpatient later this week.  proBNP 3490 on admission.    Other causes of chest pain include but less less likely PE with Geneva score of 5 (moderate risk) for tachycardia, but this is also explained by his atrial fibrillation, chronic dyspnea, no lower extremity pain, and pt is fairly mobile. Pain could be musculoskeletal in nature, and his neck pain is reproducible by palpation on physical exam.  -appreciate heart failure team input--Dr. Shirlee Latch also does not think CP is ischemic, ?2/2 volume overload.  Continue IV Lasix 80mg  q8h, may d/c on torsemide in place of lasix.  Possible LVAD candidate.   - continue telemonitoring - Continue atorvastatin - nitroglycerin prn--although patient says he does not like nitro and how he feels with no help in symptoms - pain control: prn tramadol  2) Atrial fibrillation, rate controlled--history of atrial fibrillation >1 year. He is on carvedilol and amiodarone for this at home.  TEE performed 02/16/12 and was planned to undergo cardioversion at that time with hope to return to normal sinus rhythm and improve heart failure symptoms. Cardioversion was not able to be completed as a result of left atrial appendage clot seen on echo. He had been on Coumadin previously, which was discontinued at his clinic appointment on 02/09/12 and transitioned to Apixiban for anticoagulation. He appears  rate controlled on admission.  - continue telemonitoring - Continue carvedilol and amiodarone - Continue apixaban x1 month and attempt cardioversion on amiodarone per Dr. Shirlee Latch - Will need thyroid follow-up on amiodarone therapy: TSH 6.487 on 1/23  3) NYHA 3IIb/IV Heart Failure w EF 20-25% (NICM)--echo 12/13 w EF 20-25%. Multiple hospital admissions for HF exacerbations with most recent admission at the end of January. During that admission, right heart cath was performed and notable for low output. He was started on milrinone after catheterization and diuresed w IV Lasix. He continues to receive milrinone from PICC line at 0.25 mcg per minute.  Probnp 3490 on admission, likely volume overloaded initially. Weight on admission 222 pounds comparable to prior discharge weight  of 219 pounds. No pulmonary edema on chest x-ray, chronic dyspnea.  -d/c lasix and start torsemide 40mg  in AM and 20mg  in PM per heart failure team   -Continue spironolactone 12.5 mg daily  -Continue Bidil -Continue milrinone infusion 0.25 mcg per minute  - fluid/sodium restriction  -Continue Coreg 3.125mg  bid--reduced in setting of hypotension  4) Stage III CKD--Baseline Cr is around 2. Cr 2.2 on admission, close to baseline, does not appear to have ARF. Cr increased to 2.21 (2/19) likely secondary to diuretics.    - Monitor Cr, continue diuresis with caution  5) Gout  Without flare currently.  - Continue allopurinol 100mg  daily   Dispo: possible d/c home today? With follow up in heart failure clinic and cardiologist.  The patient does have a current PCP Robynn Pane, MD), therefore will not be requiring OPC follow-up after discharge.  The patient does not have transportation limitations that hinder transportation to clinic appointments.  Services Needed at time of discharge: Y = Yes, Blank = No PT:   OT:   RN:   Equipment:   Other:     LOS: 3 days   Darden Palmer 02/21/2012, 7:13 AM

## 2012-02-22 DIAGNOSIS — I5023 Acute on chronic systolic (congestive) heart failure: Secondary | ICD-10-CM

## 2012-02-22 NOTE — Discharge Summary (Signed)
Internal Medicine Teaching [facility] Discharge Note  Name: [name] MRN: [id number] DOB: [birth year] 62 y.o.  Date of Admission: 02/18/2012  4:46 PM Date of Discharge: 02/22/2012 Attending Physician: [name] Discharge Diagnosis: Principal Problem:   Chest pain Active Problems:   Chronic systolic CHF (congestive heart failure), NYHA class 3-4   Cardiomyopathy, dilated, nonischemic   Atrial fibrillation   CHRONIC KIDNEY DISEASE STAGE II (MILD)  Discharge Medications:   Medication List    STOP taking these medications       furosemide 80 MG tablet  Commonly known as:  LASIX      TAKE these medications       allopurinol 100 MG tablet  Commonly known as:  ZYLOPRIM  Take 1 tablet (100 mg total) by mouth daily.     amiodarone 200 MG tablet  Commonly known as:  PACERONE  Take 1 tablet (200 mg total) by mouth 2 (two) times daily.     apixaban 5 MG Tabs tablet  Commonly known as:  ELIQUIS  Take 1 tablet (5 mg total) by mouth 2 (two) times daily.     carvedilol 3.125 MG tablet  Commonly known as:  COREG  Take 1 tablet (3.125 mg total) by mouth 2 (two) times daily with a meal.     isosorbide-hydrALAZINE 20-37.5 MG per tablet  Commonly known as:  BIDIL  Take 0.5 tablets by mouth 3 (three) times daily.     LORazepam 1 MG tablet  Commonly known as:  ATIVAN  Take 0.5 tablets (0.5 mg total) by mouth 2 (two) times daily as needed for anxiety.     milrinone 20 MG/100ML Soln infusion  Commonly known as:  PRIMACOR  Inject 24.875 mcg/min into the vein continuous.     ondansetron 8 MG disintegrating tablet  Commonly known as:  ZOFRAN-ODT  Take 8 mg by mouth every 8 (eight) hours as needed. 8mg  ODT q4 hours prn nausea     pantoprazole 40 MG tablet  Commonly known as:  PROTONIX  Take 40 mg by mouth daily.     polyethylene glycol powder powder  Commonly known as:  GLYCOLAX/MIRALAX  Take 17 g by mouth 2 (two) times daily. Until daily soft stools    OTC      rosuvastatin 5 MG tablet  Commonly known as:  CRESTOR  Take 1 tablet (5 mg total) by mouth at bedtime.     spironolactone 25 MG tablet  Commonly known as:  ALDACTONE  Take 0.5 tablets (12.5 mg total) by mouth daily.     torsemide 20 MG tablet  Commonly known as:  DEMADEX  Take 40mg  in AM and 20mg  in PM daily       Disposition and follow-up:   [name] was discharged from [facility] in stable condition.  At the hospital follow up visit please address: CHF/Afib/Chest pain: weight on discharge 221lbs.  Please see above for change in medications. Discontinued lasix and transitioned to Torsemide.  ?LVAD candidate.   SOB likely related to CHF: Sent home with home O2 due to o2 sat on room air while ambulating down to 85% and improved to 94% on 3L oxygen while ambulating. CKD: Cr 2.21 on discharge, appears close to baseline. Continue to monitor with diuresis.   Anxiety: on PRN ativan.     Follow-up Appointments:     Follow-up Information   Follow up with Robynn Pane, MD On 02/23/2012. (@415pm )    Contact information:   [address] [place] [address] [phone]       Follow up with Arvilla Meres, MD On 03/01/2012. (@930am )    Contact information:   [address] [place] [address] [phone]      Discharge Orders   Future Appointments Provider Department Dept Phone   03/01/2012 9:30 AM [facility] [phone]   03/11/2012 9:15 AM [facility] [phone]   Future Orders Complete By Expires     Call MD for:  difficulty breathing, headache or visual disturbances  As directed     Call MD for:  severe uncontrolled pain  As directed     Diet - low sodium heart healthy  As directed     Increase activity slowly  As directed       Consultations: Treatment Team:  Dolores Patty, MD  Procedures  Performed:  Dg Chest 2 View  02/18/2012  *RADIOLOGY REPORT*  Clinical Data: Chest pain, shortness of breath, nausea, vomiting.  CHEST - 2 VIEW  Comparison: 02/03/2012  Findings: Left AICD and right PICC line remain in place, unchanged. Cardiomegaly.  No edema.  No focal opacities or effusions.  No acute bony abnormality.  IMPRESSION: Stable cardiomegaly.  No acute cardiopulmonary disease.   Original Report Authenticated By: Charlett Nose, M.D.    Dg Chest Port 1 View  02/03/2012  *RADIOLOGY REPORT*  Clinical Data: Confirm PICC line placement  PORTABLE CHEST - 1 VIEW  Comparison: 01/25/2012; 01/20/2012  Findings:  Grossly unchanged enlarged cardiac silhouette mediastinal contours. Interval placement right upper extremity approach PICC line with tip projected over the superior cavoatrial junction.  Otherwise, stable position of support apparatus.  Mild pulmonary venous congestion without frank evidence of edema. Minimal improved aeration of the bilateral lung bases with persistent left basilar/retrocardiac opacity.  No new focal airspace opacity.  No definite pleural effusion or pneumothorax.  Unchanged bones of the lower cervical ACDF, incompletely imaged.  IMPRESSION: 1.  Right upper extremity approach PICC line tip projects of the superior cavoatrial junction. 2.  Pulmonary venous congestion with improved basilar atelectasis.   Original Report Authenticated By: Tacey Ruiz, MD    Dg Chest Portable 1 View  01/25/2012  *RADIOLOGY REPORT*  Clinical Data: Shortness of breath, chest pain, cough  PORTABLE CHEST - 1 VIEW  Comparison: 01/19/2002  Findings: .  Cardiomegaly.  Single lead cardiac pacemaker is unchanged in position.  No pulmonary edema.  There is hazy right basilar atelectasis or infiltrate.  IMPRESSION: Cardiomegaly.  Hazy right basilar atelectasis or infiltrate.  No convincing pulmonary edema.   Original Report Authenticated By: Natasha Mead, M.D.    Transesophageal Echocardiogram 02/16/12:  FINDINGS:   LEFT VENTRICLE: EF = 10-15 with global HK   RIGHT VENTRICLE: Moderate to severe diffuse HK  LEFT ATRIUM: Markedly dilated. Heavy smoke  LEFT ATRIAL APPENDAGE: Heavy smoke with early clot  RIGHT ATRIUM: Dilated  AORTIC VALVE: Trileaflet. Mild AI. No AS  MITRAL VALVE: Moderate MR  TRICUSPID VALVE: Mild TR  PULMONIC VALVE: Not well seen  PERICARDIUM: Small circumfrential effusion  DESCENDING AORTA: No plaque  DC-CV not performed due to clot.  Admission HPI: [name] is a 62 year old male with past medical history notable for nonischemic dilated cardiac myopathy with ejection fraction 20-25%, chronic kidney disease stage III, paroxysmal atrial fibrillation, sarcoidosis, hypertension, and frequent admissions for heart failure exacerbation presenting with 3 days complaints of intermittent chest  pain.  [name] reports that on Friday night, he had an episode of dizziness, "needles in his chest,"and nausea. In addition to the needlelike sensation in his chest, he reports left neck pain that radiates into his left shoulder and left chest. He took Xanax because he felt that symptoms represented a panic attack, which she's had in the past. He reports that Xanax did not help his symptoms, and he began to feel like his heart was racing and then slowing down. He is concerned that his atrial fibrillation was worsening. He said he slept poorly overnight due to anxiety. The following day, Saturday, he had to repeat similar episodes. With these episodes, he also had emesis of stomach contents.  He denies any worsening of his baseline dyspnea or orthopnea. He denies any PND. He reports that he usually swells in his hands and abdominal wall when he is volume overloaded, but reports he has not noticed any increase in the swelling.  [name] was most recently admitted to cardiology service here on 01-25-12 stage 3/4 NYHA heart failure and acute on chronic renal failure. He had a right heart cath performed at  that time notable for low cardiac output. He was diuresed with Lasix and discharged on milrinone.  Since his hospital follow, he's had one office visit with Dr.Behnsimhon on 02/09/12. At this visit, his INR was subtherapeutic, Coumadin was discontinued, and he was started on apixiban for anticoagulation. A TEE with possible cardioversion was scheduled for 02-16-12; however cardioversion was not completed during the procedure secondary to clot in left atrial appendage.  [name] reports that after the procedure on Friday he was feeling "great, "until he learned of clot found on TEE. He said this made him very upset.  He reports several nosebleeds over the past few days. He has had these in the past. Hemostasis achieved quickly with pressure. He has had a couple of days of cough, productive of clear sputum.  Denies syncope, fevers, chills, abdominal pain, diarrhea, constipation, hematochezia, dysuria, hematuria, rash, joint pain.  Hospital Course by problem list:   Chest pain--likely not ischemic, possibly secondary to volume overload in setting of chronic CHF and provoked by anxiety. Presented with 3 days of episodic "needle-like" chest pain, anxiety, and nausea, pain radiating from left shoulder but no worsening of baseline dyspnea. EKG without ischemic changes and no change from prior. Symptoms are episodic, and appeared be provoked by anxiety in setting of atrial fibrillation. He has had similar symptoms with panic attacks in the past. He may have had rapid ventricular rate at home which resolved upon admission? We discussed with Dr. [name] on the phone 2/17, he knows [name] well and does not think his pain is ischemic in origin, especially with negative enzymes x3 and unchanged EKG. He recommended discharge, control of anxiety, and follow up with him as an outpatient later in the week of discharge. proBNP 3490 on admission.  Other causes of chest pain include but less less likely PE with Geneva  score of 5 (moderate risk) for tachycardia, but this is also explained by his atrial fibrillation, chronic dyspnea, no lower extremity pain, and pt is fairly mobile. Pain could be musculoskeletal in nature, and his neck pain is reproducible by palpation on physical exam.  Heart failure team was also consulted.  Also did not think chest pain is ischemic, likely secondary to volume overload. Follow up with Cardiologist and     Chronic systolic CHF (congestive heart failure), NYHA class 3-4--in setting  of nonischemic dilated cardiomyopathy.echo 12/13 w EF 20-25%. Multiple hospital admissions for HF exacerbations with most recent admission at the end of January. During that admission, right heart cath was performed and notable for low output. He was started on milrinone after catheterization and diuresed w IV Lasix. He continues to receive milrinone from PICC line at 0.25 mcg per minute. Probnp 3490 on admission, likely volume overloaded initially. Weight on admission 222 pounds comparable to prior discharge weight of 219 pounds. Weight 221 pounds on discharge this visit.  No pulmonary edema on chest x-ray.  Chronic dyspnea. Consulted heart failure team during admission, started on IV lasix but weight down only 1 pound during admission with rise in creatinine.  Co-ox was 79% on milrinone 0.29mcg/kg/min.  Daily weights, strict intake and output records, and placed on low sodium and fluid restriction.  Lasix changed to torsemide for improved volume control.   Possible LVAD candidate.  Discharged on Amiodarone 200mg  BID, Carvedilol 3.125mg  BID, Bidil 0.5 tab TID, Apixaban 5mg  BID, Spironolactone 12.5mg  QD, Torsemide 40mg  in AM and 20mg  in PM, and continue Milrinone.   Sent home with home O2 due to o2 sat on room air while ambulating down to 85% and improved to 94% on 3L oxygen while ambulating.  Will need to follow up closely with HF clinic and further discussions with [facility] transplant team per Dr. Gala Romney.        Atrial fibrillation--history of atrial fibrillation >1 year. He is on carvedilol and amiodarone for this at home. TEE performed 02/16/12 and was planned to undergo cardioversion at that time with hope to return to normal sinus rhythm and improve heart failure symptoms. Cardioversion was not able to be completed as a result of left atrial appendage clot seen on echo. He had been on Coumadin previously, which was discontinued at his clinic appointment on 02/09/12 and transitioned to Apixiban for anticoagulation. He appeared rate controlled during hospital course.  Plan is to repeat TEE and DCCV in 4 weeks as an outpatient.  Continued on home medications and discharged on Amiodarone 200mg  BID, Carvedilol 3.125mg  BID, and Apixaban 5mg  BID on discharge per HF team.        CHRONIC KIDNEY DISEASE STAGE III--Baseline Cr is around 2. Cr 2.2 on admission, close to baseline, dID not appear to have ARF. Cr increased to 2.21 on discharge likely secondary to diuretics.  Continue diuresis with caution, lasix discontinued and transitioned to torsemide.  Close follow up in HF clinic.  Will need to monitor Cr.    Discharge Vitals:  BP 99/75  Pulse 76  Temp(Src) 98.2 F (36.8 C) (Oral)  Resp 20  Ht 6\' 3"  (1.905 m)  Wt 221 lb 5.5 oz (100.4 kg)  BMI 27.67 kg/m2  SpO2 98%  Discharge Labs:  Basic Metabolic Panel:  Recent Labs Lab 02/20/12 1035 02/21/12 0500  NA 136 139  K 3.3* 3.9  CL 100 104  CO2 27 27  GLUCOSE 131* 87  BUN 28* 30*  CREATININE 2.12* 2.21*  CALCIUM 9.6 9.8   Liver Function Tests:  Recent Labs Lab 02/18/12 2210 02/19/12 0430  AST 32 30  ALT 28 26  ALKPHOS 71 66  BILITOT 0.5 0.6  PROT 7.7 7.2  ALBUMIN 3.1* 3.1*    Recent Labs Lab 02/18/12 2210  LIPASE 46   CBC:  Recent Labs Lab 02/18/12 1655  02/19/12 0430 02/20/12 1035  WBC 6.0  --  5.9 4.6  NEUTROABS 2.8  --   --   --  HGB 13.4  < > 11.4* 10.9*  HCT 40.2  < > 35.0* 32.3*  MCV 88.0  --  87.9 86.4  PLT 262  --  214  192  < > = values in this interval not displayed. Cardiac Enzymes:  Recent Labs Lab 02/18/12 2210 02/19/12 0430 02/19/12 0826  TROPONINI <0.30 <0.30 <0.30   BNP:  Recent Labs Lab 02/18/12 2210  PROBNP 3490.0*   Urine Drug Screen: Drugs of Abuse     Component Value Date/Time   LABOPIA POSITIVE* 02/19/2012 1232   COCAINSCRNUR NONE DETECTED 02/19/2012 1232   LABBENZ POSITIVE* 02/19/2012 1232   AMPHETMU NONE DETECTED 02/19/2012 1232   THCU NONE DETECTED 02/19/2012 1232   LABBARB NONE DETECTED 02/19/2012 1232    Signed: Darden Palmer 02/22/2012, 4:56 PM   Time Spent on Discharge: 35 minutes Services Ordered on Discharge: none Equipment Ordered on Discharge: none

## 2012-02-27 ENCOUNTER — Telehealth (HOSPITAL_COMMUNITY): Payer: Self-pay | Admitting: *Deleted

## 2012-02-27 NOTE — Telephone Encounter (Signed)
Received labs from [facility], bun/cr elevated to 57/2.59, called pt he reports his wt is down to 213 today, he states he usually runs 220, per Tonye Becket, NP hold torsemide for 2 days, pt is aware and agreeable, he will keep f/u appt as sch on Fri 2/28

## 2012-02-28 IMAGING — CR DG CHEST 2V
2 series · 2 of 2 positions shown · non-contrast
Comparison: Chest radiograph performed 07/02/2010

CLINICAL DATA: Shortness of breath and chest tightness.

CHEST - 2 VIEW

[w chest pa]
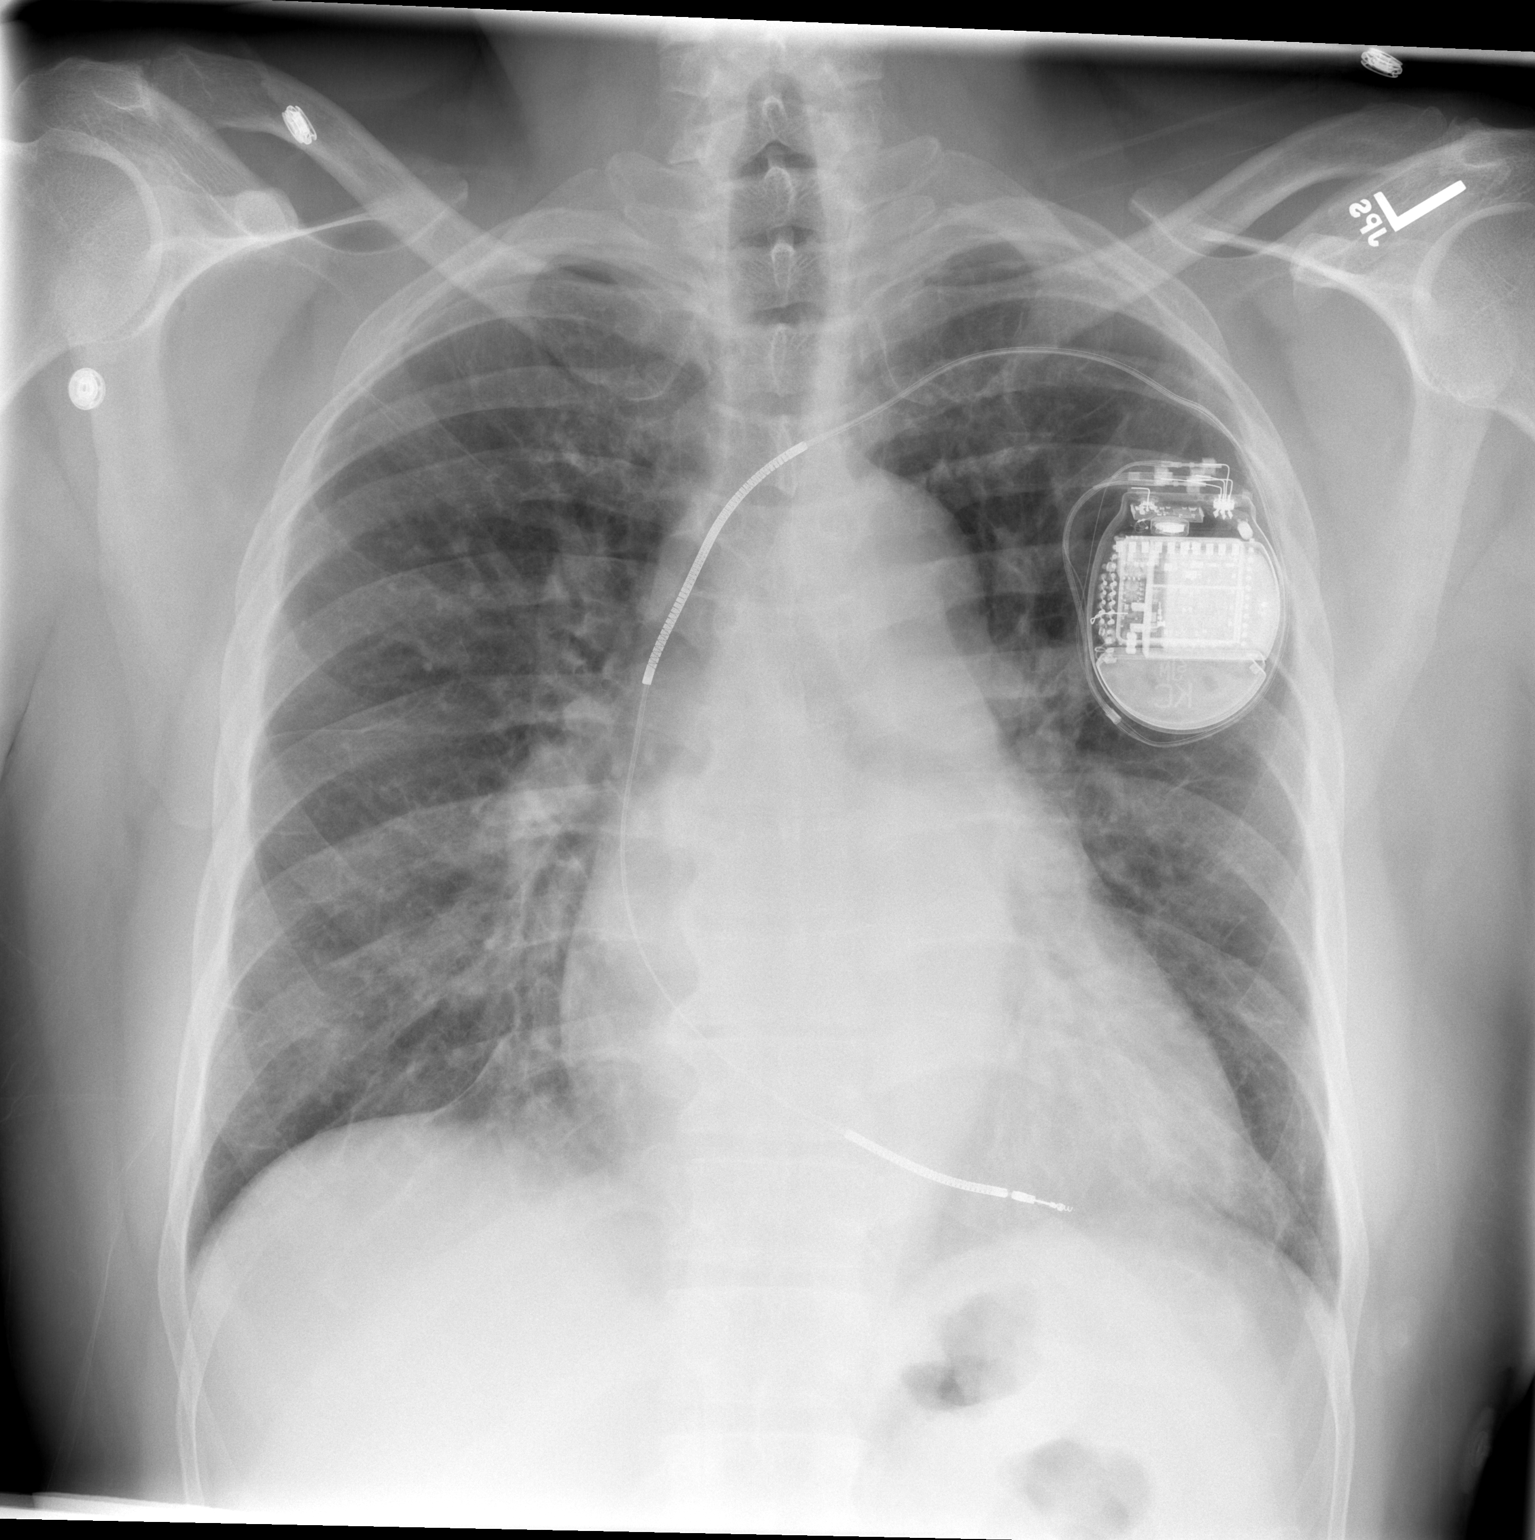

[w chest lat]
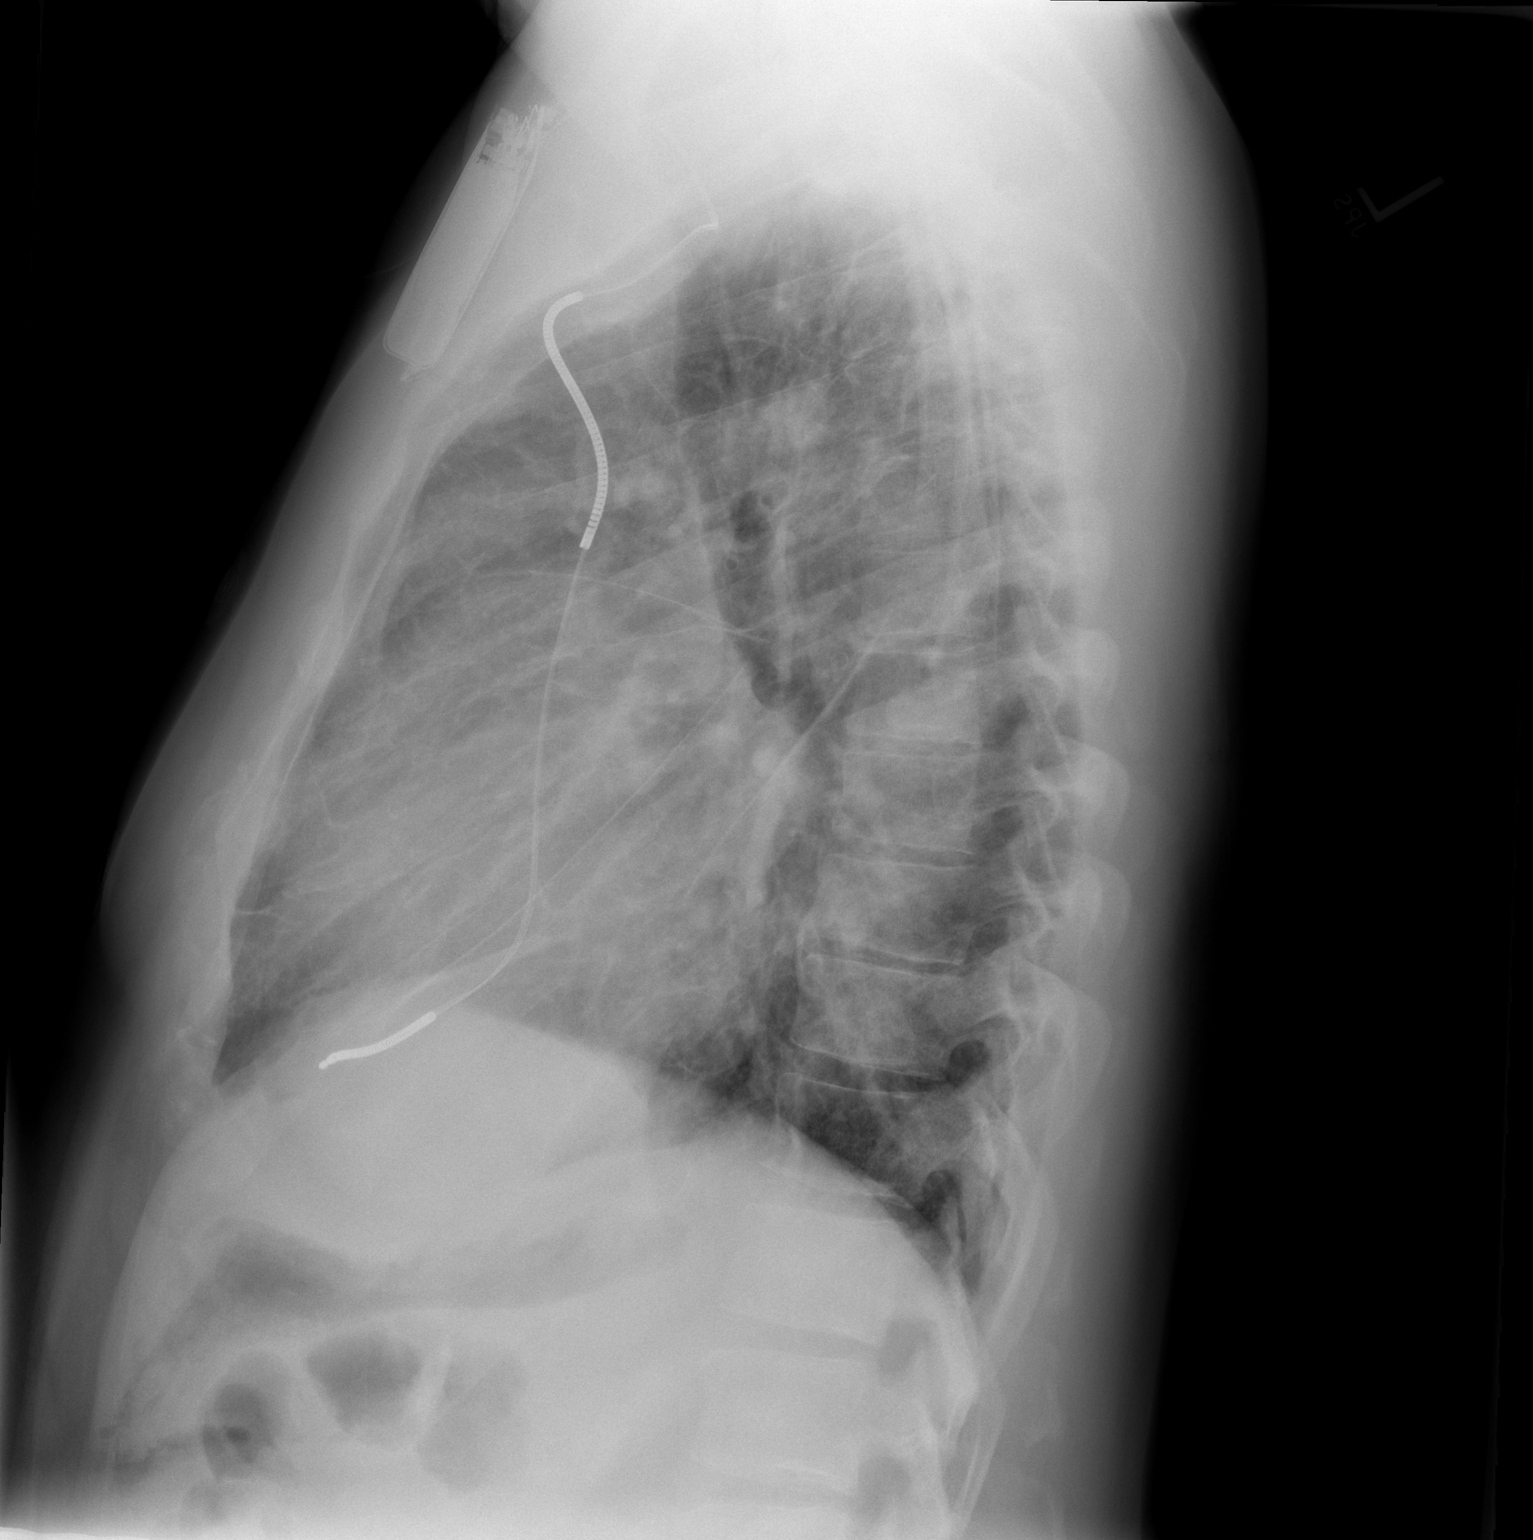

[2 of 2 positions shown; findings below may reference images not displayed]

FINDINGS: The lungs are well-aerated.  Vascular congestion is
noted, with mildly increased interstitial markings, possibly
reflecting mild interstitial edema.  There is no evidence of
pleural effusion or pneumothorax.

The heart is mildly enlarged.  An AICD is noted at the left chest
wall, with a single lead ending at the right ventricle.  No acute
osseous abnormalities are seen.
IMPRESSION: Vascular congestion and mild cardiomegaly, with mildly increased
interstitial markings, raising question for mild interstitial
edema.

## 2012-02-29 ENCOUNTER — Telehealth (HOSPITAL_COMMUNITY): Payer: Self-pay | Admitting: Cardiology

## 2012-02-29 NOTE — Telephone Encounter (Signed)
[name] called with 3 lb weight gain over night. (219) Pt is SOB with conversational talking, abdominal swelling, lungs are clear, B/P 98/58 HR 70 and irregular Pt has been very compliant with 2L fluid restrictions.   Please call with further instructions.

## 2012-02-29 NOTE — Telephone Encounter (Signed)
Spoke w/pt, his kidney fx was elevated on 2/25 and he was instructed to hold torsemide for 2 days, on 2/25 wt was 213, 2/26 214, 2/27 218, he states baseline should be around 218-220 but he does feel SOB, he will take 40 mg of torsemide now and will keep f/u appt tomorrow as sch

## 2012-03-01 ENCOUNTER — Encounter: Payer: Self-pay | Admitting: Internal Medicine

## 2012-03-01 ENCOUNTER — Ambulatory Visit (HOSPITAL_COMMUNITY)
Admission: RE | Admit: 2012-03-01 | Discharge: 2012-03-01 | Disposition: A | Discharge status: Home / Self Care | Payer: PRIVATE HEALTH INSURANCE | Source: Ambulatory Visit | Attending: Internal Medicine | Admitting: Internal Medicine

## 2012-03-01 ENCOUNTER — Encounter (HOSPITAL_COMMUNITY): Payer: Self-pay

## 2012-03-01 VITALS — BP 110/74 | HR 88 | Wt 224.4 lb

## 2012-03-01 DIAGNOSIS — I509 Heart failure, unspecified: Secondary | ICD-10-CM | POA: Insufficient documentation

## 2012-03-01 DIAGNOSIS — I5022 Chronic systolic (congestive) heart failure: Secondary | ICD-10-CM | POA: Insufficient documentation

## 2012-03-01 DIAGNOSIS — N182 Chronic kidney disease, stage 2 (mild): Secondary | ICD-10-CM | POA: Insufficient documentation

## 2012-03-01 DIAGNOSIS — I4891 Unspecified atrial fibrillation: Secondary | ICD-10-CM | POA: Insufficient documentation

## 2012-03-01 MED ORDER — TORSEMIDE 20 MG PO TABS: 40.0000 mg | ORAL_TABLET | Freq: Every day | ORAL | Status: DC

## 2012-03-01 MED ORDER — MILRINONE IN DEXTROSE 20 MG/100ML IV SOLN: 0.3750 ug/kg/min | INTRAVENOUS | Status: DC

## 2012-03-01 NOTE — Assessment & Plan Note (Signed)
Remains in afib.  Will continue apixaban and amiodarone for 3 more weeks with repeat TEE/DCCV.

## 2012-03-01 NOTE — Patient Instructions (Addendum)
Torsemide 2 tabs daily.  Labs on Monday.  Increase milrinone through [facility]  Follow up 2 weeks.

## 2012-03-01 NOTE — Assessment & Plan Note (Signed)
As above, will increase milrinone 0.375 mcg/kg/min and repeat BMET in 4 days.

## 2012-03-01 NOTE — Assessment & Plan Note (Signed)
NYHA IIIb on home milrinone 0.25 mcg/kg/min.  He presents with increased fatigue as well as a rise in Cr that could suggest worsening output.  Will plan to titrate milrinone 0.375 mcg/kg/min.  Will continue to follow him closely as it appears his window for advanced therapies may be tightening.  Will recheck BMET early next week.  Will restart torsemide 40 mg daily with mild fluid on board.

## 2012-03-01 NOTE — Progress Notes (Addendum)
Weight Range    219-221 pounds  Baseline proBNP    511 on 11/13/11    HPI: [name] is a 62 y.o. gentlemen with multiple hospital admissions for HF exacerbations. 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 He also has history of ventricular tachycardia s/p St Jude ICD by [name], chronic kidney disease: stage III, baseline Cr ~2, and paroxysmal atrial arrhythmias controlled by amiodarone. As well as sarcoidosis, hypertension. Cath in 2008 by Dr. [name] showed no CAD. Dry weight is 219 (11/2011).   Primary cardiologist [name]. Admitted with NYHA IIIB/IV HF with low out put symptoms. RHC performed with low output. Milrinone post cath. Discharged form [facility] 02/05/12 on Milrionone at 0.25 mcg via PICC.  Discharge weight 219  pounds.   RHC 01/26/12  RA = 14  RV = 51/16/15  PA = 57/34 (43)  PCW = 26  Fick cardiac output/index = 4.2/1.8  Thermo CO/CI = 3.9/1.7  PVR = 4.0 Woods  O2 sat = 97%  PA sat = 57%, 58%  Ao Pressure (non-invasive): 116/80 (93)  SVR = 1504   He returns for follow up today.  He was recently admitted with chest pain and 3 pounds weight gain.  Troponin neg x3.  He was diuresed with IV lasix.  There were plans for TEE/DCCV but DCCV canceled due to LAA clot found on TEE.  He was discharged home on torsemide 40/20.  Repeat labs on 2/24 showed increased Cr from 2.21 to 2.59.  He was told to hold torsemide for 2 days but felt increased shortness of breath so took 40 mg yesterday.  He has also noted increased fatigue.  Weight 217 pounds at home.  He is followed by [facility] for milrinone 0.25 mcg/kg/min.  He has been referred to [facility] for possible transplant and has discussion with Dr. Donata Clay for possible LVAD  ROS: All systems negative except as listed in HPI, PMH and Problem List.  Past Medical History  Diagnosis Date  . CHF (congestive heart failure)   . Sarcoidosis    . Cardiomyopathy, dilated, nonischemic     non ischemic by cath  . Acute on chronic systolic heart failure   . Automatic implantable cardiac defibrillator in situ   . Atrial fibrillation   . NSVT (nonsustained ventricular tachycardia)   . GERD (gastroesophageal reflux disease)   . Hypercholesteremia   . Myocardial infarction   . Shortness of breath   . Chronic kidney disease (CKD), stage III (moderate)   . Pacemaker   . Anginal pain   . Gout   . Hypertension     [name]  . Coronary artery disease     Current Outpatient Prescriptions  Medication Sig Dispense Refill  . allopurinol (ZYLOPRIM) 100 MG tablet Take 1 tablet (100 mg total) by mouth daily.  30 tablet  6  . amiodarone (PACERONE) 200 MG tablet Take 1 tablet (200 mg total) by mouth 2 (two) times daily.  60 tablet  6  . apixaban (ELIQUIS) 5 MG TABS tablet Take 1 tablet (5 mg total) by mouth 2 (two) times daily.  60 tablet  3  . carvedilol (COREG) 3.125 MG tablet Take 1 tablet (3.125 mg total) by mouth 2 (two) times daily with a meal.  60 tablet  1  . isosorbide-hydrALAZINE (BIDIL) 20-37.5 MG per tablet Take 0.5 tablets by mouth 3 (three) times daily.  45 tablet  1  .  LORazepam (ATIVAN) 1 MG tablet Take 0.5 tablets (0.5 mg total) by mouth 2 (two) times daily as needed for anxiety.  15 tablet  0  . milrinone (PRIMACOR) 20 MG/100ML SOLN infusion Inject 24.875 mcg/min into the vein continuous.  100 mL    . ondansetron (ZOFRAN-ODT) 8 MG disintegrating tablet Take 8 mg by mouth every 8 (eight) hours as needed. 8mg  ODT q4 hours prn nausea      . pantoprazole (PROTONIX) 40 MG tablet Take 40 mg by mouth daily.      . polyethylene glycol powder (GLYCOLAX/MIRALAX) powder Take 17 g by mouth 2 (two) times daily. Until daily soft stools  OTC  255 g  0  . rosuvastatin (CRESTOR) 5 MG tablet Take 1 tablet (5 mg total) by mouth at bedtime.      Marland Kitchen spironolactone (ALDACTONE) 25 MG tablet Take 0.5 tablets (12.5 mg total) by mouth daily.  15  tablet  6  . torsemide (DEMADEX) 20 MG tablet Take 40mg  in AM and 20mg  in PM daily  90 tablet  1   No current facility-administered medications for this encounter.     PHYSICAL EXAM: Filed Vitals:   03/01/12 0923  BP: 110/74  Pulse: 88  Weight: 224 lb 6.4 oz (101.787 kg)  SpO2: 96%    General: Well appearing. No resp difficulty  HEENT: normal  Neck: supple. JVP 6-7. Carotids 2+ bilat; no bruits. No lymphadenopathy or thryomegaly appreciated.  Cor: PMI nondisplaced. Irregular rate & rhythm. 3/55m MR. +S3  Lungs: clear 2 liters  Abdomen: soft, nontender, obese, palpable liver edge. No bruits or masses. Good bowel sounds.  Extremities: no cyanosis, clubbing, rash, no lower extremity edema RUE PICC Neuro: alert & orientedx3, cranial nerves grossly intact. moves all 4 extremities w/o difficulty. Affect pleasant     ASSESSMENT & PLAN:

## 2012-03-04 ENCOUNTER — Encounter: Payer: Self-pay | Admitting: Cardiothoracic Surgery

## 2012-03-04 ENCOUNTER — Institutional Professional Consult (permissible substitution) (INDEPENDENT_AMBULATORY_CARE_PROVIDER_SITE_OTHER): Payer: PRIVATE HEALTH INSURANCE | Admitting: Cardiothoracic Surgery

## 2012-03-04 VITALS — BP 112/75 | HR 78 | Resp 16 | Ht 75.0 in | Wt 211.0 lb

## 2012-03-04 DIAGNOSIS — I509 Heart failure, unspecified: Secondary | ICD-10-CM

## 2012-03-04 IMAGING — CR DG CHEST 2V
2 series · 2 of 2 positions shown · non-contrast
Comparison: 09/16/2010

CLINICAL DATA: 60-year-old male with chest pain and shortness of
breath.

CHEST - 2 VIEW

[w chest pa]
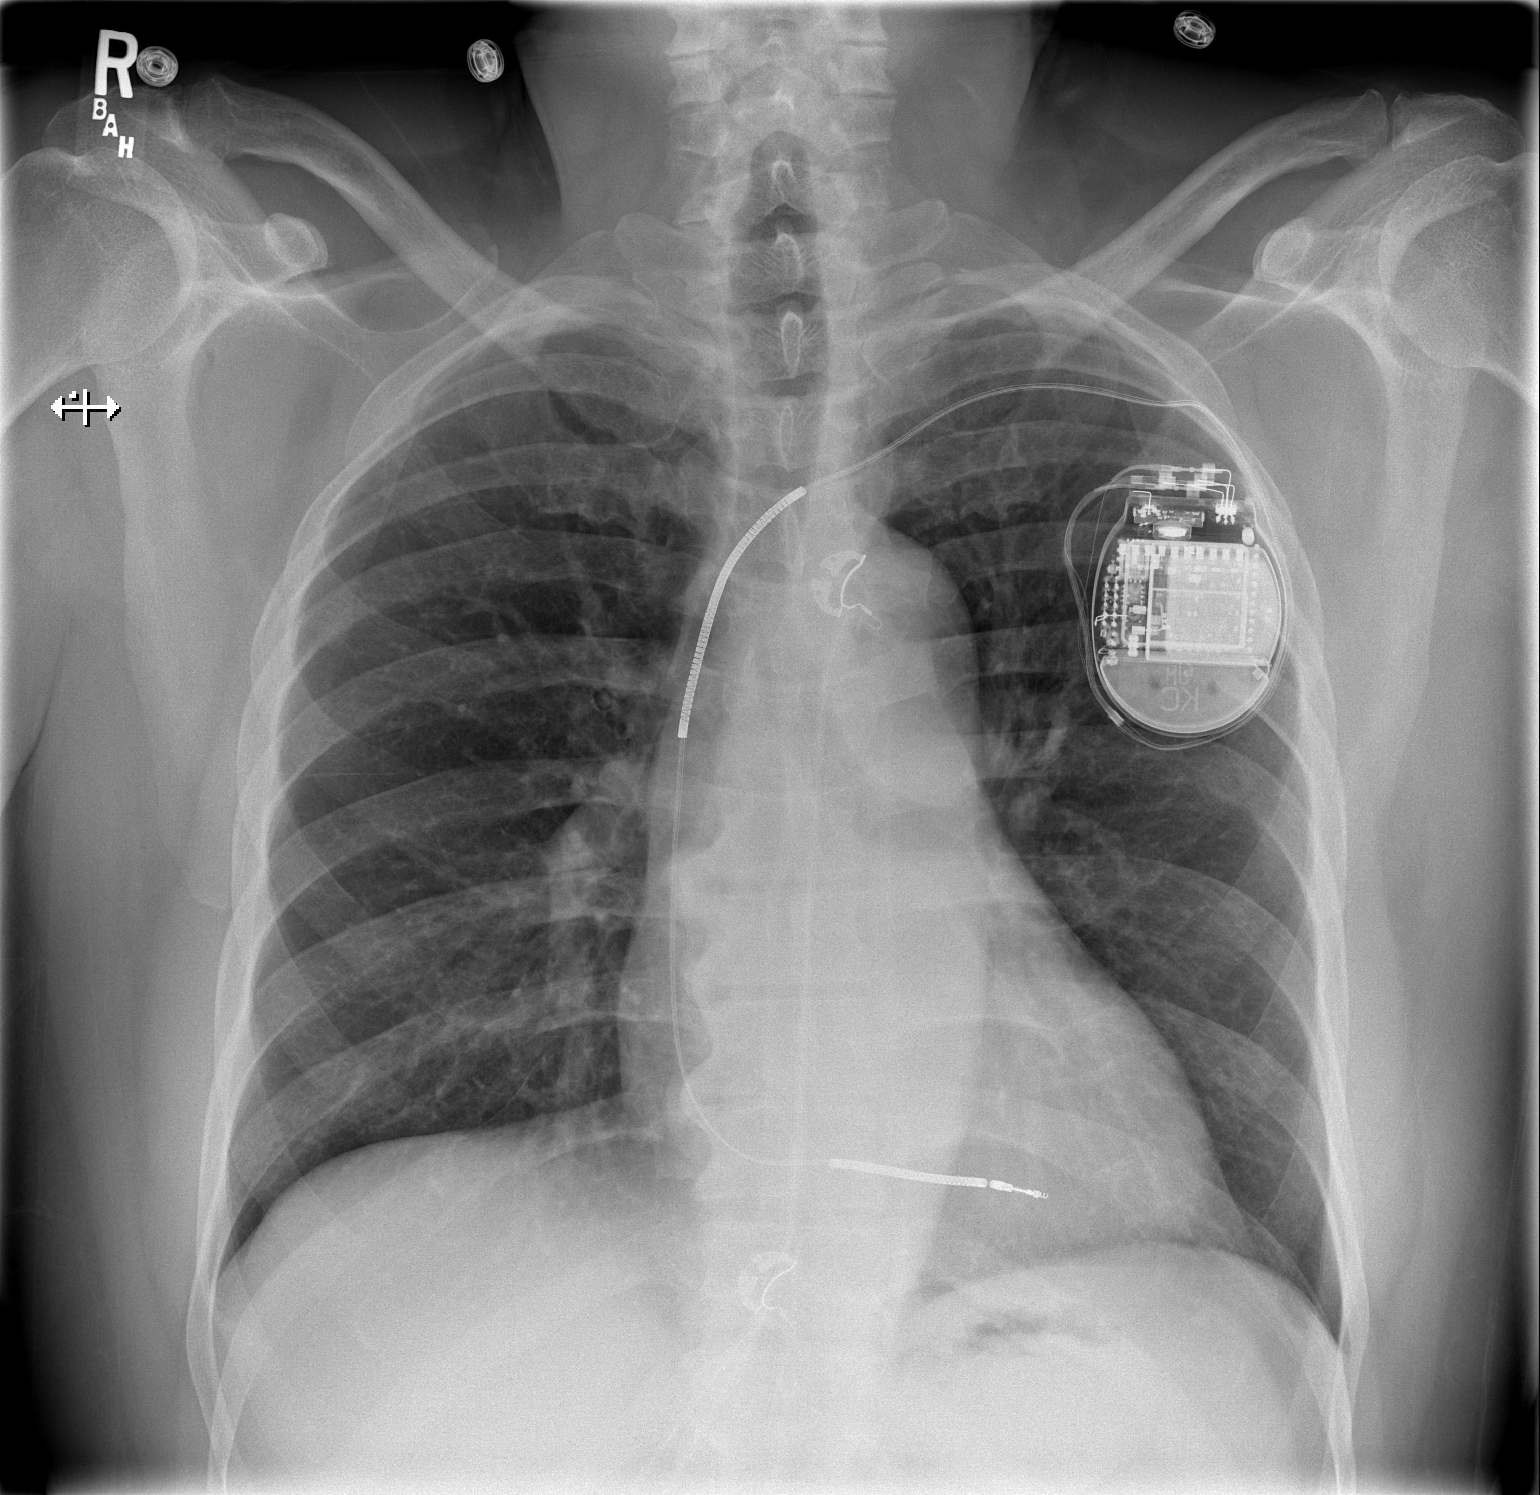

[w chest lat]
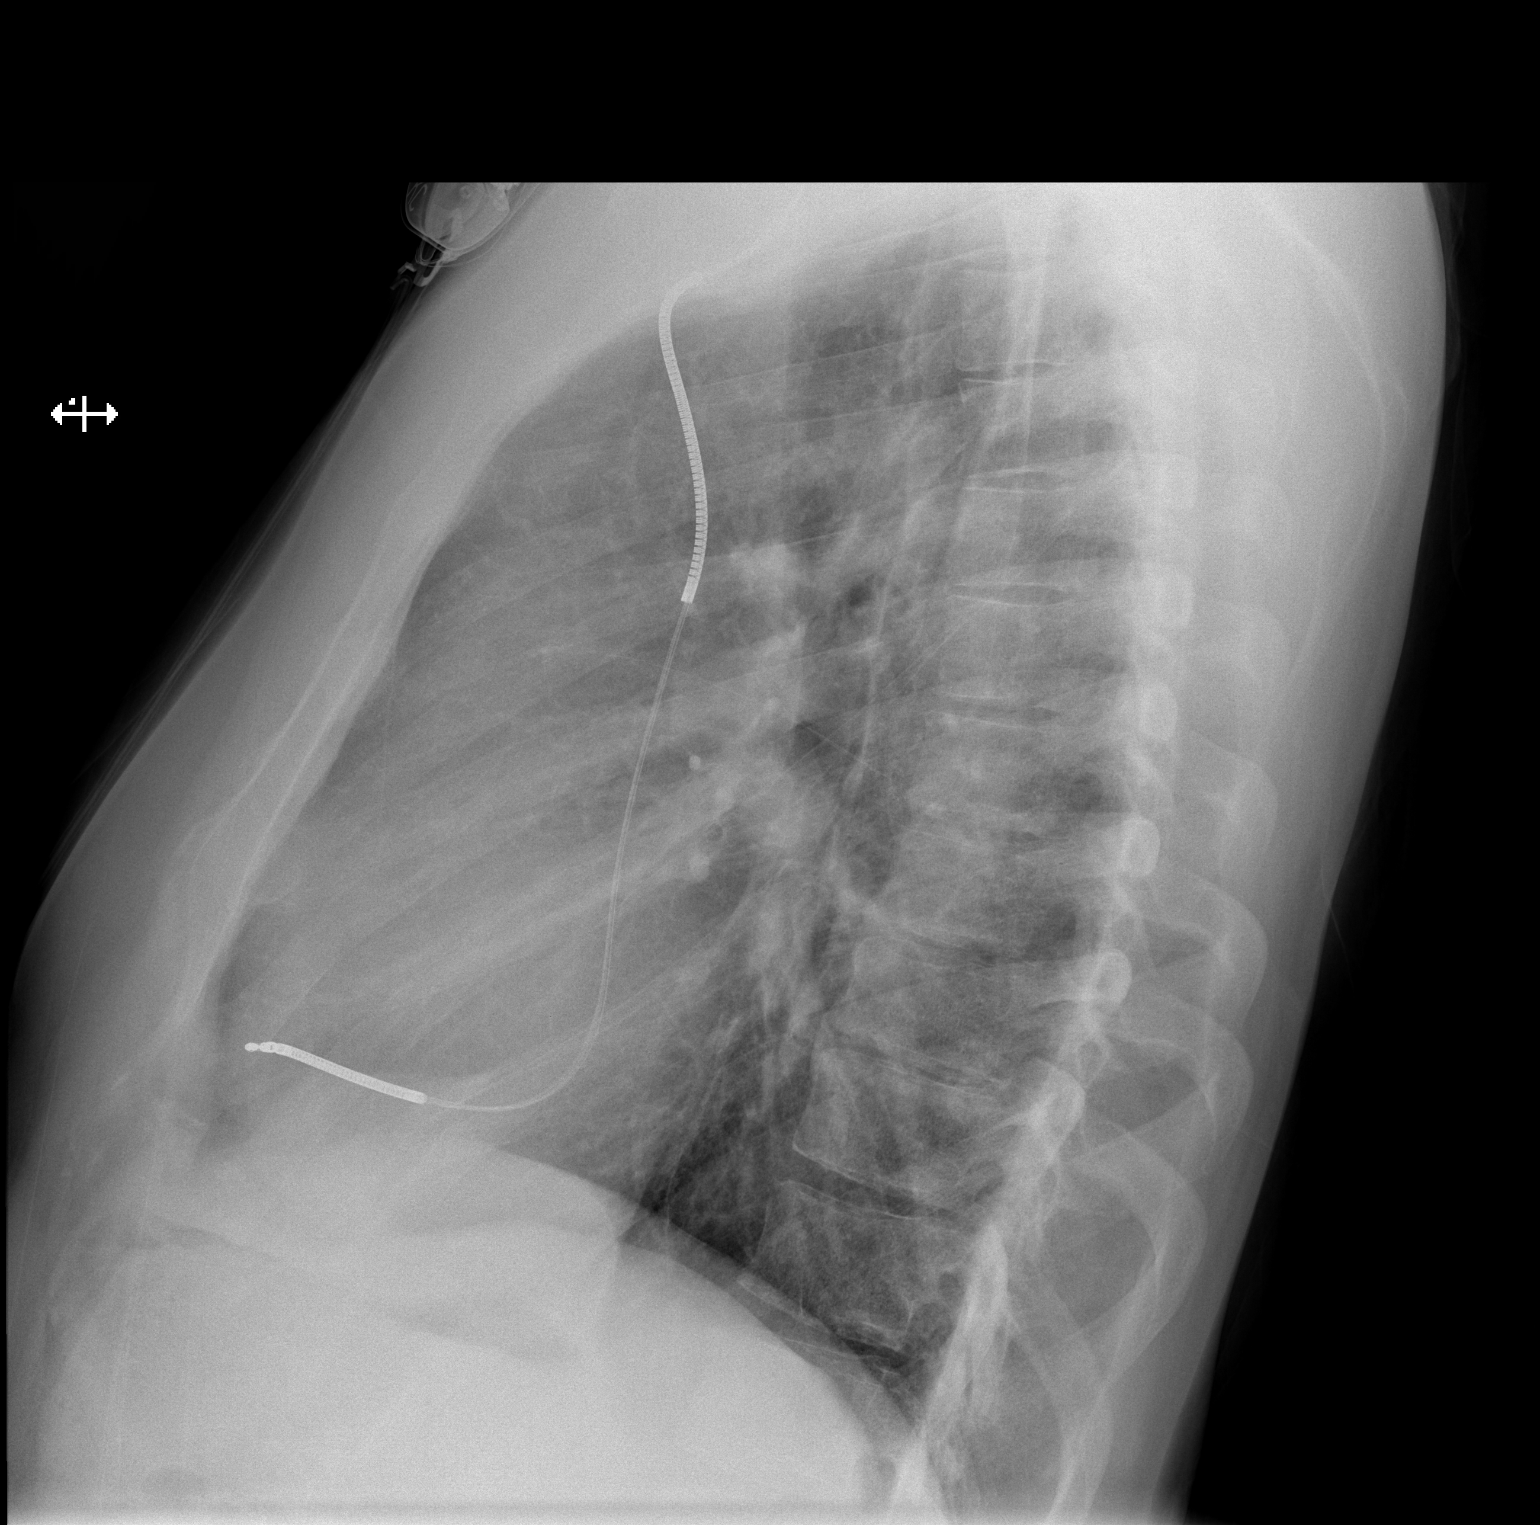

[2 of 2 positions shown; findings below may reference images not displayed]

FINDINGS: The cardiomediastinal silhouette is unremarkable.
A left AICD is again noted.
The lungs are clear.
There is no evidence of focal airspace disease, pulmonary edema,
pulmonary nodule/mass, pleural effusion, or pneumothorax.
No acute bony abnormalities are identified.
IMPRESSION: No evidence of active cardiopulmonary disease.

## 2012-03-04 NOTE — Progress Notes (Signed)
PCP is Robynn Pane, MD Referring Provider is Bensimhon, Bevelyn Buckles, MD  Chief Complaint  Patient presents with  . Congestive Heart Failure    to discuss LVAD placement    HPI: 62 year old black male with fairly long history of heart failure with normal coronaries by catheterization within the past 3 years. He is now followed in the heart failure clinic for approximately 3-4 months and is on home milrinone for the past 3-4 weeks. His EF is 20% and hehas class 4 CHF symptoms. He has dyspnea on exertion, decreased exercise tolerance and weight loss. His symptoms however improved since his milrinone dose was increased. He has a right PICC line for home IV milrinone.  The patient has a history of substance abuse including cocaine and smoking but has gone through therapy at the [facility] and is now substance free.  Currently complains of gout in his right foot and uses a walking boot because of the pain.  He has had no major surgical procedures. His had no significant previous thoracic trauma or pneumothorax. He has an implanted AICD which has shock M. on a few occasions but over a year ago.  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  The patient has a history of black tarry stools and underwent upper endoscopy last year which was negative. The patient has taken Coumadin in the past but is now on apixaban. He has not yet had a colonoscopy. He has chronic atrial fibrillation with left atrial thrombus noted on his last TEE.  His TEE last month showed fairly significant RV dysfunction with moderate TR, moderate to severe MR no significant AI Right heart cath last month showed cardiac index 1.7 PA pressures of 50/28 CVP of 15   Past Medical History  Diagnosis Date  . CHF (congestive heart failure)   . Sarcoidosis   . Cardiomyopathy, dilated, nonischemic     non ischemic by cath   . Acute on chronic systolic heart failure   . Automatic implantable cardiac defibrillator in situ   . Atrial fibrillation   . NSVT (nonsustained ventricular tachycardia)   . GERD (gastroesophageal reflux disease)   . Hypercholesteremia   . Myocardial infarction   . Shortness of breath   . Chronic kidney disease (CKD), stage III (moderate)   . Pacemaker   . Anginal pain   . Gout   . Hypertension     [name]  . Coronary artery disease     Past Surgical History  Procedure Laterality Date  . Back surgery  1987    Ruptured disk repair  . Pacemaker insertion      with ICD  . Tee without cardioversion  01/17/2011    Procedure: TRANSESOPHAGEAL ECHOCARDIOGRAM (TEE);  Surgeon: Ricki Rodriguez, MD;  Location: [facility] ENDOSCOPY;  Service: Cardiovascular;  Laterality: N/A;  . Cardioversion  01/17/2011    Procedure: CARDIOVERSION;  Surgeon: Ricki Rodriguez, MD;  Location: [facility] ENDOSCOPY;  Service: Cardiovascular;  Laterality: N/A;  . Cardiac catheterization    . Insert / replace / remove pacemaker    . Anterior cervical decomp/discectomy fusion  08/21/2011    Procedure: ANTERIOR CERVICAL DECOMPRESSION/DISCECTOMY FUSION 2 LEVELS;  Surgeon: Eldred Manges, MD;  Location: [facility] OR;  Service: Orthopedics;  Laterality: N/A;  C5-6, C6-7 Anterior Cervical Discectomy and Fusion, allograft, plate  . Tee without cardioversion N/A 02/16/2012    Procedure: TRANSESOPHAGEAL  ECHOCARDIOGRAM (TEE);  Surgeon: Dolores Patty, MD;  Location: [facility] ENDOSCOPY;  Service: Cardiovascular;  Laterality: N/A;  original case scheduled under his dad (who is deceased), rescheduled under correct mrn/pt/dob. Jauca/dl    Family History  Problem Relation Age of Onset  . Heart disease    . Heart failure    . Stroke    . Anesthesia problems Neg Hx   . Hypotension Neg Hx   . Malignant hyperthermia Neg Hx   . Pseudochol deficiency Neg Hx     Social History History  Substance Use Topics  . Smoking status: Never Smoker   . Smokeless  tobacco: Never Used  . Alcohol Use: No    Current Outpatient Prescriptions  Medication Sig Dispense Refill  . allopurinol (ZYLOPRIM) 100 MG tablet Take 1 tablet (100 mg total) by mouth daily.  30 tablet  6  . amiodarone (PACERONE) 200 MG tablet Take 1 tablet (200 mg total) by mouth 2 (two) times daily.  60 tablet  6  . apixaban (ELIQUIS) 5 MG TABS tablet Take 1 tablet (5 mg total) by mouth 2 (two) times daily.  60 tablet  3  . carvedilol (COREG) 3.125 MG tablet Take 1 tablet (3.125 mg total) by mouth 2 (two) times daily with a meal.  60 tablet  1  . isosorbide-hydrALAZINE (BIDIL) 20-37.5 MG per tablet Take 0.5 tablets by mouth 3 (three) times daily.  45 tablet  1  . LORazepam (ATIVAN) 1 MG tablet Take 0.5 tablets (0.5 mg total) by mouth 2 (two) times daily as needed for anxiety.  15 tablet  0  . milrinone (PRIMACOR) 20 MG/100ML SOLN infusion Inject 37.3125 mcg/min into the vein continuous.  100 mL    . ondansetron (ZOFRAN-ODT) 8 MG disintegrating tablet Take 8 mg by mouth every 8 (eight) hours as needed. 8mg  ODT q4 hours prn nausea      . pantoprazole (PROTONIX) 40 MG tablet Take 40 mg by mouth daily.      . polyethylene glycol powder (GLYCOLAX/MIRALAX) powder Take 17 g by mouth 2 (two) times daily. Until daily soft stools  OTC  255 g  0  . rosuvastatin (CRESTOR) 5 MG tablet Take 1 tablet (5 mg total) by mouth at bedtime.      Marland Kitchen spironolactone (ALDACTONE) 25 MG tablet Take 0.5 tablets (12.5 mg total) by mouth daily.  15 tablet  6  . torsemide (DEMADEX) 20 MG tablet Take 2 tablets (40 mg total) by mouth daily.  90 tablet  1   No current facility-administered medications for this visit.    No Known Allergies  Review of Systems  Weight loss 230 down to2 15   no fever Patient had a flu influenza shot is fall No history thoracic trauma No history of diabetes No current substance abuse practice No history of malignancy Prior history of mini stroke probable embolus from A. Fib   No  significant motor deficit No history of peripheral vascular disease  BP 112/75  Pulse 78  Resp 16  Ht 6\' 3"  (1.905 m)  Wt 211 lb (95.709 kg)  BMI 26.37 kg/m2  SpO2 92% Physical Exam General appearance middle-aged [place] male chronically ill no acute distress pleasant and appropriate HEENT normocephalic pupils equal dentition poor 1 left upper broken tooth Neck mild JVD no carotid bruit mass or adenopathy Chest without deformity breath sounds distant but clear Cardiac irregular rhythm with a positive S3 gallop no murmur Abdomen soft without pulsatile mass Extremities PICC line right  upper extremity, amild clubbing and no cyanosis edema or tenderness Neuro no focal motor deficit  Diagnostic Tests: Chest x-ray, TEE, right heart cath data all reviewed  Impression: Idiopathic dilated cardiomyopathy class for CHF on home milrinone. Patient currently stable but not doing well with weight loss.  Possibility mechanical support with a LVAD discussed with patient. However issues to be resolved would include RV dysfunction, history of sarcoidosis-needs PFTs and chest CT, history of melena and would need colonoscopy. We'll discuss patient a VAD conference  Plan:

## 2012-03-07 ENCOUNTER — Other Ambulatory Visit: Payer: Self-pay

## 2012-03-08 ENCOUNTER — Other Ambulatory Visit: Payer: Self-pay

## 2012-03-08 DIAGNOSIS — I509 Heart failure, unspecified: Secondary | ICD-10-CM

## 2012-03-12 ENCOUNTER — Encounter (HOSPITAL_BASED_OUTPATIENT_CLINIC_OR_DEPARTMENT_OTHER)
Admission: RE | Disposition: A | Discharge status: Home / Self Care | Payer: Self-pay | Source: Ambulatory Visit | Attending: Internal Medicine

## 2012-03-12 ENCOUNTER — Encounter (HOSPITAL_COMMUNITY): Payer: Self-pay | Admitting: Internal Medicine

## 2012-03-12 ENCOUNTER — Inpatient Hospital Stay (HOSPITAL_BASED_OUTPATIENT_CLINIC_OR_DEPARTMENT_OTHER)
Admission: RE | Admit: 2012-03-12 | Discharge: 2012-03-12 | Disposition: A | Discharge status: Home / Self Care | Payer: PRIVATE HEALTH INSURANCE | Source: Ambulatory Visit | Attending: Internal Medicine | Admitting: Internal Medicine

## 2012-03-12 ENCOUNTER — Ambulatory Visit (INDEPENDENT_AMBULATORY_CARE_PROVIDER_SITE_OTHER): Payer: PRIVATE HEALTH INSURANCE

## 2012-03-12 ENCOUNTER — Ambulatory Visit (HOSPITAL_COMMUNITY)
Admission: RE | Admit: 2012-03-12 | Discharge: 2012-03-12 | Disposition: A | Discharge status: Home / Self Care | Payer: PRIVATE HEALTH INSURANCE | Source: Ambulatory Visit | Attending: Internal Medicine | Admitting: Internal Medicine

## 2012-03-12 VITALS — BP 98/68 | HR 85 | Wt 221.5 lb

## 2012-03-12 DIAGNOSIS — R079 Chest pain, unspecified: Secondary | ICD-10-CM | POA: Insufficient documentation

## 2012-03-12 DIAGNOSIS — I5022 Chronic systolic (congestive) heart failure: Secondary | ICD-10-CM

## 2012-03-12 DIAGNOSIS — I509 Heart failure, unspecified: Secondary | ICD-10-CM

## 2012-03-12 DIAGNOSIS — I4891 Unspecified atrial fibrillation: Secondary | ICD-10-CM | POA: Insufficient documentation

## 2012-03-12 DIAGNOSIS — M7989 Other specified soft tissue disorders: Secondary | ICD-10-CM | POA: Insufficient documentation

## 2012-03-12 LAB — POCT I-STAT 3, VENOUS BLOOD GAS (G3P V)
Acid-Base Excess: 1 mmol/L (ref 0.0–2.0)
Acid-Base Excess: 2 mmol/L (ref 0.0–2.0)
Bicarbonate: 26.5 mEq/L — ABNORMAL HIGH (ref 20.0–24.0)
O2 Saturation: 49 %
TCO2: 29 mmol/L (ref 0–100)
pCO2, Ven: 41.9 mmHg — ABNORMAL LOW (ref 45.0–50.0)
pH, Ven: 7.425 — ABNORMAL HIGH (ref 7.250–7.300)
pH, Ven: 7.404 — ABNORMAL HIGH (ref 7.250–7.300)
pO2, Ven: 29 mmHg — CL (ref 30.0–45.0)

## 2012-03-12 LAB — CBC
Hemoglobin: 13.9 g/dL (ref 13.0–17.0)
MCH: 28.8 pg (ref 26.0–34.0)
Platelets: 285 10*3/uL (ref 150–400)
RBC: 4.82 MIL/uL (ref 4.22–5.81)
WBC: 5.9 10*3/uL (ref 4.0–10.5)

## 2012-03-12 LAB — BASIC METABOLIC PANEL
CO2: 29 mEq/L (ref 19–32)
Chloride: 98 mEq/L (ref 96–112)
Glucose, Bld: 103 mg/dL — ABNORMAL HIGH (ref 70–99)
Potassium: 3.3 mEq/L — ABNORMAL LOW (ref 3.5–5.3)
Sodium: 137 mEq/L (ref 135–145)

## 2012-03-12 SURGERY — JV RIGHT HEART CATHETERIZATION

## 2012-03-12 MED ORDER — SODIUM CHLORIDE 0.9 % IV SOLN
INTRAVENOUS | Status: DC
  Administered 2012-03-12: 14:00:00 via INTRAVENOUS

## 2012-03-12 MED ORDER — ACETAMINOPHEN 325 MG PO TABS: 650.0000 mg | ORAL_TABLET | ORAL | Status: DC | PRN

## 2012-03-12 MED ORDER — SODIUM CHLORIDE 0.9 % IV SOLN: INTRAVENOUS | Status: AC

## 2012-03-12 MED ORDER — SODIUM CHLORIDE 0.9 % IJ SOLN
10.0000 mL | INTRAMUSCULAR | Status: DC | PRN
  Administered 2012-03-12: 10 mL

## 2012-03-12 MED ORDER — ONDANSETRON HCL 4 MG/2ML IJ SOLN: 4.0000 mg | Freq: Four times a day (QID) | INTRAMUSCULAR | Status: DC | PRN

## 2012-03-12 MED ORDER — SODIUM CHLORIDE 0.9 % IJ SOLN: 10.0000 mL | Freq: Two times a day (BID) | INTRAMUSCULAR | Status: DC

## 2012-03-12 MED ORDER — HEPARIN SOD (PORK) LOCK FLUSH 100 UNIT/ML IV SOLN
250.0000 [IU] | INTRAVENOUS | Status: AC | PRN
  Administered 2012-03-12: 250 [IU]

## 2012-03-12 NOTE — CV Procedure (Signed)
Cardiac Cath Procedure Note:  Indication:  Heart failure  Procedures performed:  1) Right heart catheterization  Description of procedure:   The risks and indication of the procedure were explained. Consent was signed and placed on the chart. An appropriate timeout was taken prior to the procedure. The right groin was prepped and draped in the routine sterile fashion and anesthetized with 1% local lidocaine.   A 7 FR venous sheath was placed in the right femoral vein using a modified Seldinger technique. A standard Swan-Ganz catheter was used for the procedure.   Complications: None apparent.  Findings:  RA =  1 RV = 24/0/2 PA = 24/13 (18) PCW = 6 Fick cardiac output/index = 4.1/1.8 Thermo CO/CI = 3.3/1.4 PVR = 2.9 FA sat = 93% PA sat =55%, 57% SBP (non-invasive) =  97/70 (79) SVR = 1560 dynes  Assessment:   1. Volume depletion with low output HF despite inotropic therapy   Plan/Discussion:  Will hold diuretics. Will need to discuss timing/eligibility for advanced therapies with Dr. Donata Clay and entire VAD team. PICC line is too far in RA and will need to be retracted.   Truman Hayward 2:38 PM

## 2012-03-12 NOTE — Addendum Note (Signed)
Encounter addended by: Dolores Patty, MD on: 03/12/2012  2:10 PM<BR>     Documentation filed: Follow-up Section, LOS Section

## 2012-03-12 NOTE — Assessment & Plan Note (Addendum)
Home milrinone increased last visit to 0.375 mcg/kg/min due to possible worsening low output symptoms.  This did improve his symptoms until yesterday.  He presents today with increased fatigue and weakness.  He is having episodes where his HR ranges from 40-150 but this appears out of proportion to his symptoms.  Will plan for RHC today to reassess hemodynamics on milrinone and further discussion of advanced therapies.   Patient seen and examined with Darrell Blossom, PA-C. We discussed all aspects of the encounter. I agree with the assessment and plan as stated above. He continues to struggle with HF symptoms despite increasing doses of milrinone. It is unclear to me if this is more driven by his AF or simply worsening HF. Will plan RHC today. If outputs are low will need admit for IABP and consideration of LVAD. If numbers ok plan repeat TEE/DCVV next week.

## 2012-03-12 NOTE — Progress Notes (Signed)
Patients records faxed to Dr Shaune Pollack at [facility] at [phone] per Dr Gala Romney

## 2012-03-12 NOTE — Patient Instructions (Addendum)

## 2012-03-12 NOTE — Progress Notes (Addendum)
Weight Range    219-221 pounds  Baseline proBNP    511 on 11/13/11    HPI: [name] is a 62 y.o. gentlemen with multiple hospital admissions for HF exacerbations. 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 He also has history of ventricular tachycardia s/p St Jude ICD by [name], chronic kidney disease: stage III, baseline Cr ~2, and paroxysmal atrial arrhythmias controlled by amiodarone. As well as sarcoidosis, hypertension. Cath in 2008 by Dr. [name] showed no CAD. Dry weight is 219 (11/2011).   Primary cardiologist [name]. Admitted with NYHA IIIB/IV HF with low out put symptoms. RHC performed with low output. Milrinone post cath. Discharged form [facility] 02/05/12 on Milrionone at 0.25 mcg via PICC.  Discharge weight 219  pounds.   RHC 01/26/12  RA = 14  RV = 51/16/15  PA = 57/34 (43)  PCW = 26  Fick cardiac output/index = 4.2/1.8  Thermo CO/CI = 3.9/1.7  PVR = 4.0 Woods  O2 sat = 97%  PA sat = 57%, 58%  Ao Pressure (non-invasive): 116/80 (93)  SVR = 1504   He was recently admitted with chest pain and 3 pounds weight gain.  Troponin neg x3.  He was diuresed with IV lasix.  There were plans for TEE/DCCV but DCCV canceled due to LAA clot found on TEE.  He was discharged home on torsemide 40/20.    He returns for work in visit today.  He was evaluated at the coumadin clinic today and SBP 82 and HR ranging 40-140s and c/o not feeling well.  He has increased fatigue and weakness.  Last visit milrinone increased 0.375 due to rise in Cr and fatigue suggestive of worsening low output.  He felt improvement in his symptoms until yesterday morning.  When he began to feel increased fatigue and weakness.  He noted pounding in his chest and palpitations.  SBP 118  HR 47  This morning.  Wt 213-217.  213 recently.  SBP running 88 (lowest) mostly 100-110s.  ICD interrogation showed 2.9% pacing  when HR falls below 45 bpm.  HR mostly stays around 70-90.  Does have burst up to 130-150 bpm.     ROS: All systems negative except as listed in HPI, PMH and Problem List.  Past Medical History  Diagnosis Date  . CHF (congestive heart failure)   . Sarcoidosis   . Cardiomyopathy, dilated, nonischemic     non ischemic by cath  . Acute on chronic systolic heart failure   . Automatic implantable cardiac defibrillator in situ   . Atrial fibrillation   . NSVT (nonsustained ventricular tachycardia)   . GERD (gastroesophageal reflux disease)   . Hypercholesteremia   . Myocardial infarction   . Shortness of breath   . Chronic kidney disease (CKD), stage III (moderate)   . Pacemaker   . Anginal pain   . Gout   . Hypertension     [name]  . Coronary artery disease     Current Outpatient Prescriptions  Medication Sig Dispense Refill  . allopurinol (ZYLOPRIM) 100 MG tablet Take 1 tablet (100 mg total) by mouth daily.  30 tablet  6  . amiodarone (PACERONE) 200 MG tablet Take 1 tablet (200 mg total) by mouth 2 (two) times daily.  60 tablet  6  . apixaban (ELIQUIS) 5 MG TABS tablet Take 1 tablet (5 mg total) by mouth 2 (two) times daily.  60  tablet  3  . carvedilol (COREG) 3.125 MG tablet Take 1 tablet (3.125 mg total) by mouth 2 (two) times daily with a meal.  60 tablet  1  . isosorbide-hydrALAZINE (BIDIL) 20-37.5 MG per tablet Take 0.5 tablets by mouth 3 (three) times daily.  45 tablet  1  . LORazepam (ATIVAN) 1 MG tablet Take 0.5 tablets (0.5 mg total) by mouth 2 (two) times daily as needed for anxiety.  15 tablet  0  . milrinone (PRIMACOR) 20 MG/100ML SOLN infusion Inject 37.3125 mcg/min into the vein continuous.  100 mL    . ondansetron (ZOFRAN-ODT) 8 MG disintegrating tablet Take 8 mg by mouth every 8 (eight) hours as needed. 8mg  ODT q4 hours prn nausea      . pantoprazole (PROTONIX) 40 MG tablet Take 40 mg by mouth daily.      . polyethylene glycol powder (GLYCOLAX/MIRALAX) powder  Take 17 g by mouth 2 (two) times daily. Until daily soft stools  OTC  255 g  0  . rosuvastatin (CRESTOR) 5 MG tablet Take 1 tablet (5 mg total) by mouth at bedtime.      Marland Kitchen spironolactone (ALDACTONE) 25 MG tablet Take 0.5 tablets (12.5 mg total) by mouth daily.  15 tablet  6  . torsemide (DEMADEX) 20 MG tablet Take 2 tablets (40 mg total) by mouth daily.  90 tablet  1   No current facility-administered medications for this encounter.     PHYSICAL EXAM: Filed Vitals:   03/12/12 1110  BP: 98/68  Pulse: 85  Weight: 221 lb 8 oz (100.472 kg)  SpO2: 99%    General: Well appearing. No resp difficulty  HEENT: normal  Neck: supple. JVP flat. Carotids 2+ bilat; no bruits. No lymphadenopathy or thryomegaly appreciated.  Cor: PMI nondisplaced. Irregular rate & rhythm. 3/38m MR. +S3  Lungs: clear 2   Abdomen: soft, nontender, obese, palpable liver edge. No bruits or masses. Good bowel sounds.  Extremities: no cyanosis, clubbing, rash, no lower extremity edema RUE PICC Neuro: alert & orientedx3, cranial nerves grossly intact. moves all 4 extremities w/o difficulty. Affect pleasant   EKG: atrial fib 76 bpm.  QTc 528 ms   ASSESSMENT & PLAN:

## 2012-03-12 NOTE — Progress Notes (Signed)
Pt was started on Eliquis 5mg  BID for atrial fib on 02/09/12  By Dr Gala Romney.  Reviewed patients medication list.  Pt is not currently on any combined P-gp and strong CYP3A4 inhibitors/inducers (ketoconazole, traconazole, ritonavir, carbamazepine, phenytoin, rifampin, St. John's wort).  Reviewed labs.  SCr 2.67 , Weight 100 Kg, CrCl- 40.  Dose appropriate based on age, weight, and Scr.   Hgb and HCT 13.9/40.6  WNL. Pt aware that we will follow up in .

## 2012-03-13 ENCOUNTER — Other Ambulatory Visit (HOSPITAL_COMMUNITY): Payer: Self-pay

## 2012-03-13 ENCOUNTER — Ambulatory Visit (HOSPITAL_COMMUNITY)
Admission: RE | Admit: 2012-03-13 | Discharge: 2012-03-13 | Disposition: A | Discharge status: Home / Self Care | Payer: PRIVATE HEALTH INSURANCE | Source: Ambulatory Visit | Attending: Cardiothoracic Surgery | Admitting: Cardiothoracic Surgery

## 2012-03-13 ENCOUNTER — Encounter (HOSPITAL_COMMUNITY): Payer: Self-pay

## 2012-03-13 ENCOUNTER — Inpatient Hospital Stay (HOSPITAL_COMMUNITY)
Admission: RE | Admit: 2012-03-13 | Discharge: 2012-03-13 | Disposition: A | Discharge status: Home / Self Care | Payer: PRIVATE HEALTH INSURANCE | Source: Ambulatory Visit | Attending: Cardiothoracic Surgery | Admitting: Cardiothoracic Surgery

## 2012-03-13 DIAGNOSIS — R0989 Other specified symptoms and signs involving the circulatory and respiratory systems: Secondary | ICD-10-CM

## 2012-03-13 DIAGNOSIS — Z01810 Encounter for preprocedural cardiovascular examination: Secondary | ICD-10-CM

## 2012-03-13 DIAGNOSIS — Z01818 Encounter for other preprocedural examination: Secondary | ICD-10-CM | POA: Insufficient documentation

## 2012-03-13 DIAGNOSIS — Z01811 Encounter for preprocedural respiratory examination: Secondary | ICD-10-CM | POA: Insufficient documentation

## 2012-03-13 DIAGNOSIS — K029 Dental caries, unspecified: Secondary | ICD-10-CM | POA: Insufficient documentation

## 2012-03-13 DIAGNOSIS — M7989 Other specified soft tissue disorders: Secondary | ICD-10-CM

## 2012-03-13 LAB — POCT I-STAT 3, VENOUS BLOOD GAS (G3P V)
Acid-Base Excess: 2 mmol/L (ref 0.0–2.0)
Bicarbonate: 25.4 mEq/L — ABNORMAL HIGH (ref 20.0–24.0)
O2 Saturation: 93 %
TCO2: 27 mmol/L (ref 0–100)
pO2, Ven: 63 mmHg — ABNORMAL HIGH (ref 30.0–45.0)

## 2012-03-13 LAB — PULMONARY FUNCTION TEST

## 2012-03-13 NOTE — Progress Notes (Signed)
Bilateral:  No evidence of hemodynamically significant internal carotid artery stenosis.   Vertebral artery flow is antegrade.     

## 2012-03-13 NOTE — Progress Notes (Signed)
Bilateral:  No evidence of DVT, superficial thrombosis, or Baker's Cyst.   

## 2012-03-14 ENCOUNTER — Encounter (HOSPITAL_COMMUNITY): Payer: Self-pay

## 2012-03-14 NOTE — Addendum Note (Signed)
Encounter addended by: Simon Rhein, CCT on: 03/14/2012 10:21 AM<BR>     Documentation filed: Charges VN

## 2012-03-20 ENCOUNTER — Other Ambulatory Visit (HOSPITAL_COMMUNITY): Payer: Self-pay | Admitting: Adult Health

## 2012-03-20 ENCOUNTER — Telehealth (HOSPITAL_COMMUNITY): Payer: Self-pay | Admitting: *Deleted

## 2012-03-20 NOTE — Telephone Encounter (Signed)
Per Dr Gala Romney pt needed TEE/DCCV this week for a-fib, pt sch for 3/21 at 10, pt aware of instructions and to arrive at short stay at 8:30, he has been holding his torsemide since Monday evening b/c he was feeling bad again, he is feeling better his wt is stable at 218, no edema or SOB, per Tonye Becket, NP have pt hold torsemide until wt reaches 220 then restart at 20 mg daily, pt is aware and agreeable, he is going to [place] tomorrow for his transplant eval

## 2012-03-22 ENCOUNTER — Encounter (HOSPITAL_COMMUNITY): Payer: Self-pay

## 2012-03-22 ENCOUNTER — Ambulatory Visit (HOSPITAL_COMMUNITY)
Admission: RE | Admit: 2012-03-22 | Discharge: 2012-03-22 | Disposition: A | Discharge status: Home / Self Care | Payer: PRIVATE HEALTH INSURANCE | Source: Ambulatory Visit | Attending: Cardiology | Admitting: Cardiology

## 2012-03-22 ENCOUNTER — Encounter (HOSPITAL_COMMUNITY)
Admission: RE | Disposition: A | Discharge status: Home / Self Care | Payer: Self-pay | Source: Ambulatory Visit | Attending: Cardiology

## 2012-03-22 ENCOUNTER — Encounter (HOSPITAL_COMMUNITY): Payer: Self-pay | Admitting: *Deleted

## 2012-03-22 DIAGNOSIS — I4891 Unspecified atrial fibrillation: Secondary | ICD-10-CM | POA: Insufficient documentation

## 2012-03-22 DIAGNOSIS — I428 Other cardiomyopathies: Secondary | ICD-10-CM | POA: Insufficient documentation

## 2012-03-22 DIAGNOSIS — I5020 Unspecified systolic (congestive) heart failure: Secondary | ICD-10-CM | POA: Insufficient documentation

## 2012-03-22 DIAGNOSIS — I059 Rheumatic mitral valve disease, unspecified: Secondary | ICD-10-CM | POA: Insufficient documentation

## 2012-03-22 DIAGNOSIS — I079 Rheumatic tricuspid valve disease, unspecified: Secondary | ICD-10-CM | POA: Insufficient documentation

## 2012-03-22 DIAGNOSIS — N183 Chronic kidney disease, stage 3 unspecified: Secondary | ICD-10-CM | POA: Insufficient documentation

## 2012-03-22 DIAGNOSIS — I359 Nonrheumatic aortic valve disorder, unspecified: Secondary | ICD-10-CM | POA: Insufficient documentation

## 2012-03-22 DIAGNOSIS — D869 Sarcoidosis, unspecified: Secondary | ICD-10-CM | POA: Insufficient documentation

## 2012-03-22 DIAGNOSIS — I129 Hypertensive chronic kidney disease with stage 1 through stage 4 chronic kidney disease, or unspecified chronic kidney disease: Secondary | ICD-10-CM | POA: Insufficient documentation

## 2012-03-22 DIAGNOSIS — I379 Nonrheumatic pulmonary valve disorder, unspecified: Secondary | ICD-10-CM | POA: Insufficient documentation

## 2012-03-22 HISTORY — PX: TEE WITHOUT CARDIOVERSION: SHX5443

## 2012-03-22 HISTORY — PX: CARDIOVERSION: SHX1299

## 2012-03-22 SURGERY — ECHOCARDIOGRAM, TRANSESOPHAGEAL
Anesthesia: Moderate Sedation

## 2012-03-22 MED ORDER — MIDAZOLAM HCL 10 MG/2ML IJ SOLN
INTRAMUSCULAR | Status: DC | PRN
  Administered 2012-03-22 (×3): 2 mg via INTRAVENOUS

## 2012-03-22 MED ORDER — FENTANYL CITRATE 0.05 MG/ML IJ SOLN
INTRAMUSCULAR | Status: DC | PRN
  Administered 2012-03-22 (×2): 25 ug via INTRAVENOUS

## 2012-03-22 MED ORDER — MIDAZOLAM HCL 5 MG/ML IJ SOLN
INTRAMUSCULAR | Status: AC
  Filled 2012-03-22: qty 3

## 2012-03-22 MED ORDER — DIPHENHYDRAMINE HCL 50 MG/ML IJ SOLN
INTRAMUSCULAR | Status: AC
  Filled 2012-03-22: qty 1

## 2012-03-22 MED ORDER — SODIUM CHLORIDE 0.9 % IV SOLN
INTRAVENOUS | Status: DC
  Administered 2012-03-22: 500 mL via INTRAVENOUS

## 2012-03-22 MED ORDER — FENTANYL CITRATE 0.05 MG/ML IJ SOLN
INTRAMUSCULAR | Status: AC
  Filled 2012-03-22: qty 4

## 2012-03-22 MED ORDER — BUTAMBEN-TETRACAINE-BENZOCAINE 2-2-14 % EX AERO
INHALATION_SPRAY | CUTANEOUS | Status: DC | PRN
  Administered 2012-03-22: 2 via TOPICAL

## 2012-03-22 NOTE — H&P (Signed)
Hospital Encounter  03/01/2012  Hospital Visit   [name]    MRN: [id number]           Patient Demographics and Encounter Information     Patient Demographics    Patient Name Sex DOB         Age SSN Address Phone    [name], Darrell Male September 24, 1950 [phone] y.o.) [id number] [place] [address] [phone] [facility])      Hospital Account# [id number]    Payor Plan    [place] [employer] [facility]      Progress Notes    Hadassah Pais, [place] at 03/01/2012  2:49 PM    Status: Addendum                      Weight Range     219-221 pounds   Baseline proBNP     511 on 11/13/11        HPI: [name] is a 62 y.o. gentlemen with multiple hospital admissions for HF exacerbations. 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 He also has history of ventricular tachycardia s/p St Jude ICD by [name], chronic kidney disease: stage III, baseline Cr ~2, and paroxysmal atrial arrhythmias controlled by amiodarone. As well as sarcoidosis, hypertension. Cath in 2008 by Dr. [name] showed no CAD. Dry weight is 219 (11/2011).    Primary cardiologist [name]. Admitted with NYHA IIIB/IV HF with low out put symptoms. RHC performed with low output. Milrinone post cath. Discharged form [facility] 02/05/12 on Milrionone at 0.25 mcg via PICC.  Discharge weight 219  pounds.    RHC 01/26/12  RA = 14   RV = 51/16/15   PA = 57/34 (43)   PCW = 26   Fick cardiac output/index = 4.2/1.8   Thermo CO/CI = 3.9/1.7   PVR = 4.0 Woods   O2 sat = 97%   PA sat = 57%, 58%   Ao Pressure (non-invasive): 116/80 (93)   SVR = 1504    He returns for follow up today.  He was recently admitted with chest pain and 3 pounds weight gain.  Troponin neg x3.  He was diuresed with IV lasix.  There were plans for TEE/DCCV but DCCV canceled due to LAA clot found on TEE.  He was discharged home on  torsemide 40/20.  Repeat labs on 2/24 showed increased Cr from 2.21 to 2.59. He was told to hold torsemide for 2 days but felt increased shortness of breath so took 40 mg yesterday.  He has also noted increased fatigue.  Weight 217 pounds at home.  He is followed by [facility] for milrinone 0.25 mcg/kg/min.  He has been referred to [facility] for possible transplant and has discussion with Dr. Donata Clay for possible LVAD   ROS: All systems negative except as listed in HPI, PMH and Problem List.    Past Medical History   Diagnosis  Date   .  CHF (congestive heart failure)     .  Sarcoidosis     .  Cardiomyopathy, dilated, nonischemic         non ischemic by cath   .  Acute on chronic systolic heart failure     .  Automatic implantable cardiac defibrillator in situ     .  Atrial fibrillation     .  NSVT (nonsustained ventricular tachycardia)     .  GERD (gastroesophageal reflux disease)     .  Hypercholesteremia     .  Myocardial infarction     .  Shortness of breath     .  Chronic kidney disease (CKD), stage III (moderate)     .  Pacemaker     .  Anginal pain     .  Gout     .  Hypertension         [name]   .  Coronary artery disease           Current Outpatient Prescriptions   Medication  Sig  Dispense  Refill   .  allopurinol (ZYLOPRIM) 100 MG tablet  Take 1 tablet (100 mg total) by mouth daily.   30 tablet   6   .  amiodarone (PACERONE) 200 MG tablet  Take 1 tablet (200 mg total) by mouth 2 (two) times daily.   60 tablet   6   .  apixaban (ELIQUIS) 5 MG TABS tablet  Take 1 tablet (5 mg total) by mouth 2 (two) times daily.   60 tablet   3   .  carvedilol (COREG) 3.125 MG tablet  Take 1 tablet (3.125 mg total) by mouth 2 (two) times daily with a meal.   60 tablet   1   .  isosorbide-hydrALAZINE (BIDIL) 20-37.5 MG per tablet  Take 0.5 tablets by mouth 3 (three) times daily.   45 tablet   1   .  LORazepam (ATIVAN) 1 MG tablet  Take 0.5 tablets (0.5 mg total) by mouth 2 (two) times daily as  needed for anxiety.   15 tablet   0   .  milrinone (PRIMACOR) 20 MG/100ML SOLN infusion  Inject 24.875 mcg/min into the vein continuous.   100 mL      .  ondansetron (ZOFRAN-ODT) 8 MG disintegrating tablet  Take 8 mg by mouth every 8 (eight) hours as needed. 8mg  ODT q4 hours prn nausea         .  pantoprazole (PROTONIX) 40 MG tablet  Take 40 mg by mouth daily.         .  polyethylene glycol powder (GLYCOLAX/MIRALAX) powder  Take 17 g by mouth 2 (two) times daily. Until daily soft stools   OTC   255 g   0   .  rosuvastatin (CRESTOR) 5 MG tablet  Take 1 tablet (5 mg total) by mouth at bedtime.         Marland Kitchen  spironolactone (ALDACTONE) 25 MG tablet  Take 0.5 tablets (12.5 mg total) by mouth daily.   15 tablet   6   .  torsemide (DEMADEX) 20 MG tablet  Take 40mg  in AM and 20mg  in PM daily   90 tablet   1       No current facility-administered medications for this encounter.          PHYSICAL EXAM: Filed Vitals:     03/01/12 0923   BP:  110/74   Pulse:  88   Weight:  224 lb 6.4 oz (101.787 kg)   SpO2:  96%        General: Well appearing. No resp difficulty   HEENT: normal   Neck: supple. JVP 6-7. Carotids 2+ bilat; no bruits. No lymphadenopathy or thryomegaly appreciated.   Cor: PMI nondisplaced. Irregular rate & rhythm. 3/96m MR. +S3   Lungs: clear 2 liters   Abdomen: soft, nontender, obese, palpable liver edge. No bruits or masses.  Good bowel sounds.   Extremities: no cyanosis, clubbing, rash, no lower extremity edema RUE PICC Neuro: alert & orientedx3, cranial nerves grossly intact. moves all 4 extremities w/o difficulty. Affect pleasant         ASSESSMENT & PLAN:              Revision History       Date/Time User Action    > 03/01/2012  2:49 PM Hadassah Pais, PA Addendum      03/01/2012  2:46 PM Hadassah Pais, PA Signed              Chronic systolic CHF (congestive heart failure), NYHA class 3-4 - Hadassah Pais, PA at 03/01/2012  2:59 PM    Status: Written  Related Problem: Chronic systolic CHF (congestive heart failure), NYHA class 3-4           NYHA IIIb on home milrinone 0.25 mcg/kg/min.  He presents with increased fatigue as well as a rise in Cr that could suggest worsening output.  Will plan to titrate milrinone 0.375 mcg/kg/min.  Will continue to follow him closely as it appears his window for advanced therapies may be tightening.  Will recheck BMET early next week.  Will restart torsemide 40 mg daily with mild fluid on board.            Atrial fibrillation - Hadassah Pais, PA at 03/01/2012  3:01 PM    Status: Written Related Problem: Atrial fibrillation           Remains in afib.  Will continue apixaban and amiodarone for 3 more weeks with repeat TEE/DCCV.         CHRONIC KIDNEY DISEASE STAGE II (MILD) - Hadassah Pais, PA at 03/01/2012  3:02 PM    Status: Written Related Problem: CHRONIC KIDNEY DISEASE STAGE II (MILD)           As above, will increase milrinone 0.375 mcg/kg/min and repeat BMET in 4 days.    For TEE DCCV; patient states he is taking apixiban twice daily and has missed no doses. Olga Millers

## 2012-03-22 NOTE — CV Procedure (Signed)
See full TEE report in [facility]; severe spontaneous contrast in LAA and possible small thrombus at tip. Continue apixiban; DCCV cancelled.  Olga Millers

## 2012-03-22 NOTE — Progress Notes (Signed)
*  PRELIMINARY RESULTS* Echocardiogram TEE has been performed.  Jeryl Columbia 03/22/2012, 11:09 AM

## 2012-03-22 NOTE — Interval H&P Note (Signed)
History and Physical Interval Note:  03/22/2012 10:18 AM  [name]  has presented today for surgery, with the diagnosis of a-fib  The various methods of treatment have been discussed with the patient and family. After consideration of risks, benefits and other options for treatment, the patient has consented to  Procedure(s): TRANSESOPHAGEAL ECHOCARDIOGRAM (TEE) (N/A) CARDIOVERSION (N/A) as a surgical intervention .  The patient's history has been reviewed, patient examined, no change in status, stable for surgery.  I have reviewed the patient's chart and labs.  Questions were answered to the patient's satisfaction.     Olga Millers

## 2012-03-25 ENCOUNTER — Encounter (HOSPITAL_COMMUNITY): Payer: Self-pay | Admitting: Cardiology

## 2012-03-27 ENCOUNTER — Telehealth (HOSPITAL_COMMUNITY): Payer: Self-pay | Admitting: *Deleted

## 2012-03-27 ENCOUNTER — Encounter: Payer: Self-pay | Admitting: Internal Medicine

## 2012-03-27 MED ORDER — MAGNESIUM OXIDE 400 MG PO TABS: 400.0000 mg | ORAL_TABLET | Freq: Every day | ORAL | Status: DC

## 2012-03-27 NOTE — Telephone Encounter (Signed)
Received labs from [facility], bun/cr improved at 35/2.2, mag 1.7 per Ulyess Blossom, PA start mag-ox 400 mg daily, pt is aware

## 2012-03-30 IMAGING — CR DG CHEST 2V
2 series · 2 of 2 positions shown · non-contrast
Comparison: 09/21/2010

CLINICAL DATA: Chest pain.

CHEST - 2 VIEW

[w chest pa]
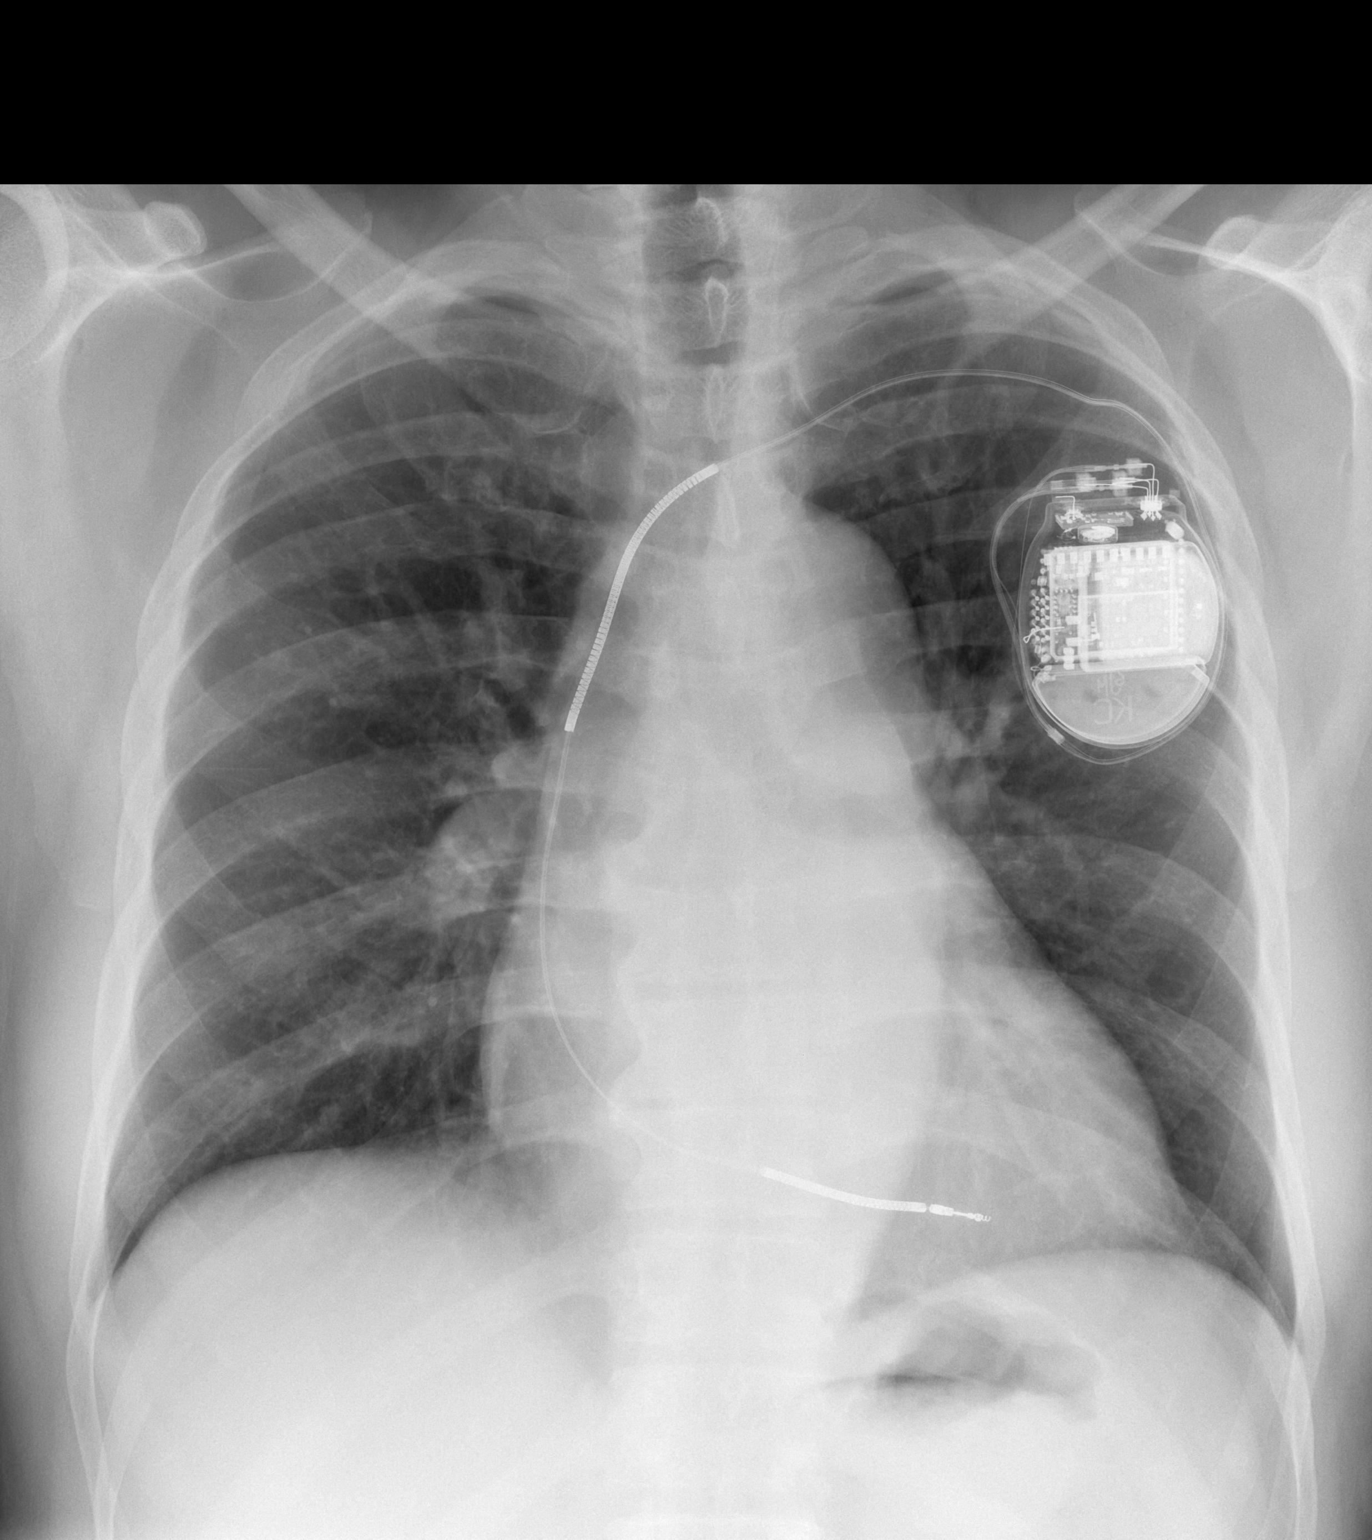

[w chest lat]
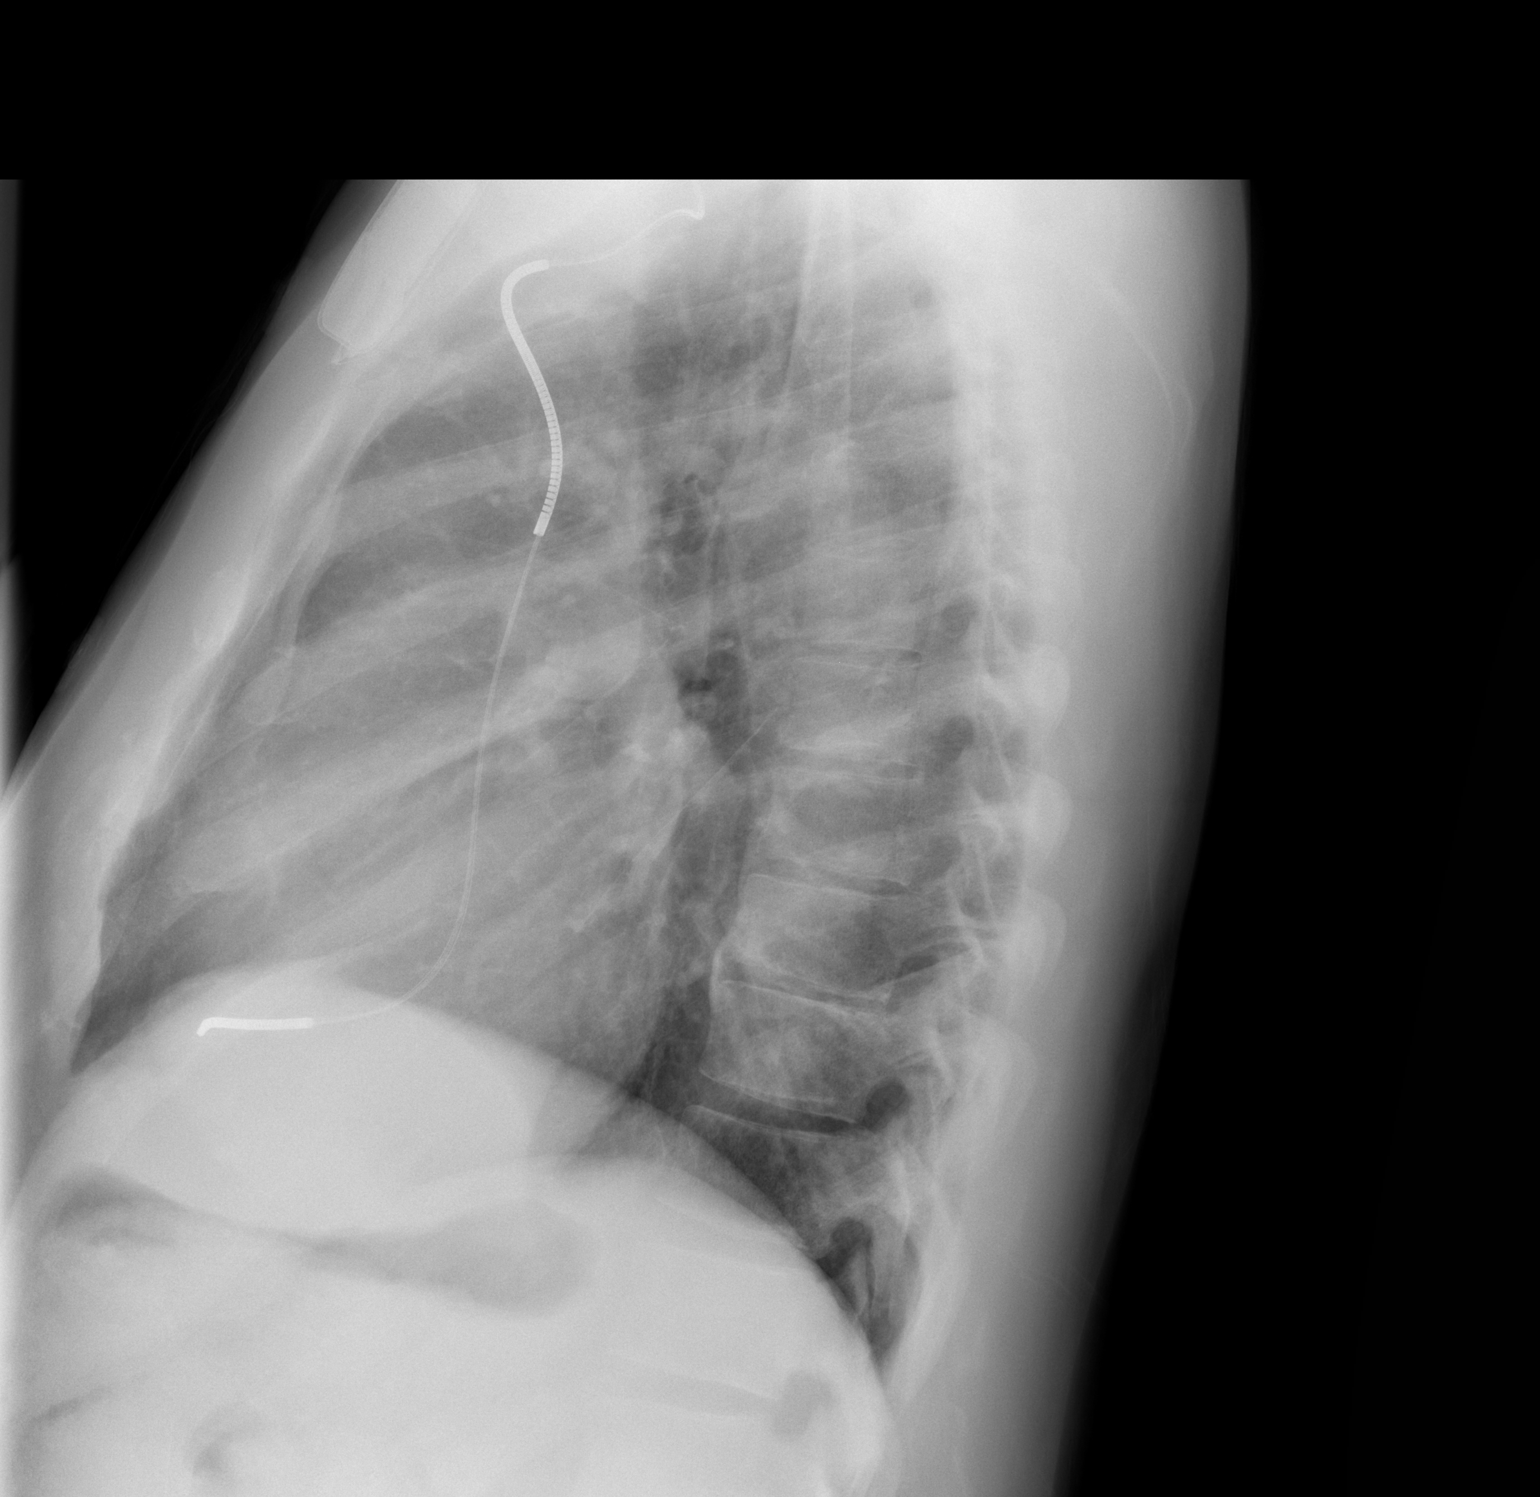

[2 of 2 positions shown; findings below may reference images not displayed]

FINDINGS: Left AICD is unchanged.  Mild cardiomegaly.  Lungs are
clear.  No effusions.  No acute bony abnormality.  Early
degenerative changes in the thoracic spine.
IMPRESSION: Cardiomegaly.  No active disease.

## 2012-04-02 ENCOUNTER — Telehealth (HOSPITAL_COMMUNITY): Payer: Self-pay | Admitting: Cardiology

## 2012-04-02 NOTE — Telephone Encounter (Signed)
Pt called to ask when will he have tee/cardioversion again?

## 2012-04-03 ENCOUNTER — Telehealth (HOSPITAL_COMMUNITY): Payer: Self-pay | Admitting: Adult Health

## 2012-04-03 ENCOUNTER — Encounter: Payer: Self-pay | Admitting: *Deleted

## 2012-04-03 ENCOUNTER — Encounter (HOSPITAL_COMMUNITY): Payer: Self-pay

## 2012-04-03 ENCOUNTER — Ambulatory Visit (HOSPITAL_COMMUNITY)
Admission: RE | Admit: 2012-04-03 | Discharge: 2012-04-03 | Disposition: A | Discharge status: Home / Self Care | Payer: PRIVATE HEALTH INSURANCE | Source: Ambulatory Visit | Attending: Internal Medicine | Admitting: Internal Medicine

## 2012-04-03 VITALS — BP 116/80 | HR 112 | Wt 224.0 lb

## 2012-04-03 DIAGNOSIS — I509 Heart failure, unspecified: Secondary | ICD-10-CM

## 2012-04-03 DIAGNOSIS — Z95810 Presence of automatic (implantable) cardiac defibrillator: Secondary | ICD-10-CM

## 2012-04-03 DIAGNOSIS — I5022 Chronic systolic (congestive) heart failure: Secondary | ICD-10-CM

## 2012-04-03 DIAGNOSIS — I4891 Unspecified atrial fibrillation: Secondary | ICD-10-CM

## 2012-04-03 LAB — ICD DEVICE OBSERVATION

## 2012-04-03 MED ORDER — AMIODARONE HCL 200 MG PO TABS: 200.0000 mg | ORAL_TABLET | Freq: Two times a day (BID) | ORAL | Status: DC

## 2012-04-03 NOTE — Assessment & Plan Note (Signed)
He felt as if ICD fired last week. We interrogated device in clinic and no events.

## 2012-04-03 NOTE — Addendum Note (Signed)
Encounter addended by: Dolores Patty, MD on: 04/03/2012 11:55 PM<BR>     Documentation filed: Charges VN, Problem List

## 2012-04-03 NOTE — Patient Instructions (Addendum)
Labs next week with [facility]  Your physician recommends that you schedule a follow-up appointment in: 3-4 weeks/hms

## 2012-04-03 NOTE — Progress Notes (Signed)
Weight Range    219-221 pounds  Baseline proBNP    511 on 11/13/11    HPI: Mr. Twist is a 62 y.o. gentlemen with multiple hospital admissions for HF exacerbations. 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 He also has history of ventricular tachycardia s/p St Jude ICD by [name], chronic kidney disease: stage III, baseline Cr ~2, and paroxysmal atrial arrhythmias controlled by amiodarone. As well as sarcoidosis, hypertension. Cath in 2008 by Dr. [name] showed no CAD. Dry weight is 219 (11/2011).   Primary cardiologist [name]. Admitted with NYHA IIIB/IV HF with low out put symptoms. RHC performed with low output. Milrinone post cath. Discharged form [facility] 02/05/12 on Milrionone at 0.25 mcg via PICC.  Discharge weight 219  pounds.   RHC 01/26/12  RA = 14  RV = 51/16/15  PA = 57/34 (43)  PCW = 26  Fick cardiac output/index = 4.2/1.8  Thermo CO/CI = 3.9/1.7  PVR = 4.0 Woods  O2 sat = 97%  PA sat = 57%, 58%  Ao Pressure (non-invasive): 116/80 (93)  SVR = 1504   Last month milrinone increased 0.3l66mc/kg/min  Returns today for follow-up. Had TEE on 3/21 for possible DC-CV however showed persistent LA clot so no DC-CV attempted. Seen at [facility] and not felt to be a transplant candidate. Remains with NYHA IIIb symptoms with dyspnea on minimal exertion. Weighs every day. Weight usually 214-215. 2 days ago weight up 5 pounds and took extra demadex. Weight came right off. Reports ICD shock last week. Working on arranging for his brother's 49 year old grandson to help him.   Taking demadex 40 am 20 pm says if he misses a dose weight goes right up.    ROS: All systems negative except as listed in HPI, PMH and Problem List.  Past Medical History  Diagnosis Date  . CHF (congestive heart failure)   . Sarcoidosis   . Cardiomyopathy, dilated, nonischemic     non ischemic by cath  . Acute  on chronic systolic heart failure   . Automatic implantable cardiac defibrillator in situ   . Atrial fibrillation   . NSVT (nonsustained ventricular tachycardia)   . GERD (gastroesophageal reflux disease)   . Hypercholesteremia   . Myocardial infarction   . Shortness of breath   . Chronic kidney disease (CKD), stage III (moderate)   . Pacemaker   . Anginal pain   . Gout   . Hypertension     [name]  . Coronary artery disease     Current Outpatient Prescriptions  Medication Sig Dispense Refill  . allopurinol (ZYLOPRIM) 100 MG tablet Take 1 tablet (100 mg total) by mouth daily.  30 tablet  6  . amiodarone (PACERONE) 200 MG tablet Take 1 tablet (200 mg total) by mouth 2 (two) times daily.  60 tablet  6  . apixaban (ELIQUIS) 5 MG TABS tablet Take 1 tablet (5 mg total) by mouth 2 (two) times daily.  60 tablet  3  . carvedilol (COREG) 3.125 MG tablet Take 1 tablet (3.125 mg total) by mouth 2 (two) times daily with a meal.  60 tablet  1  . isosorbide-hydrALAZINE (BIDIL) 20-37.5 MG per tablet Take 0.5 tablets by mouth 3 (three) times daily.  45 tablet  1  . LORazepam (ATIVAN) 1 MG tablet Take 0.5 tablets (0.5 mg total) by mouth 2 (two) times daily as needed for anxiety.  15  tablet  0  . magnesium oxide (MAG-OX 400) 400 MG tablet Take 1 tablet (400 mg total) by mouth daily.  30 tablet  6  . milrinone (PRIMACOR) 20 MG/100ML SOLN infusion Inject 37.3125 mcg/min into the vein continuous.  100 mL    . ondansetron (ZOFRAN-ODT) 8 MG disintegrating tablet Take 8 mg by mouth every 8 (eight) hours as needed. 8mg  ODT q4 hours prn nausea      . pantoprazole (PROTONIX) 40 MG tablet Take 40 mg by mouth daily.      . polyethylene glycol powder (GLYCOLAX/MIRALAX) powder Take 17 g by mouth 2 (two) times daily. Until daily soft stools  OTC  255 g  0  . rosuvastatin (CRESTOR) 5 MG tablet Take 1 tablet (5 mg total) by mouth at bedtime.      Marland Kitchen spironolactone (ALDACTONE) 25 MG tablet Take 0.5 tablets (12.5  mg total) by mouth daily.  15 tablet  6  . torsemide (DEMADEX) 20 MG tablet Take 2 tablets (40 mg total) by mouth daily.  90 tablet  1   No current facility-administered medications for this encounter.     PHYSICAL EXAM: Filed Vitals:   04/03/12 1055  BP: 116/80  Pulse: 112  Weight: 224 lb (101.606 kg)  SpO2: 95%    General: Well appearing. No resp difficulty  HEENT: normal  Neck: supple. JVP flat. Carotids 2+ bilat; no bruits. No lymphadenopathy or thryomegaly appreciated.  Cor: PMI nondisplaced. Irregular rate & rhythm. 3/61m MR. +S3  Lungs: clear 2   Abdomen: soft, nontender, obese, palpable liver edge. No bruits or masses. Good bowel sounds.  Extremities: no cyanosis, clubbing, rash, no lower extremity edema RUE PICC Neuro: alert & orientedx3, cranial nerves grossly intact. moves all 4 extremities w/o difficulty. Affect pleasant    Cr today 2.59 (was 2.2)   ASSESSMENT & PLAN:

## 2012-04-03 NOTE — Telephone Encounter (Signed)
No answer.   Called to provide lab work. Creatinine trending up.  If weight ok 218-220 pounds. He will need to hold diuretics.   Syncere Kaminski 9:18 AM

## 2012-04-03 NOTE — Telephone Encounter (Signed)
Pt seen in clinic 04/03/12

## 2012-04-03 NOTE — Assessment & Plan Note (Signed)
He is feeling better with milrinone but continues to struggle with advanced HF symptoms. Creatinine back up but volume status rises quickly if he cuts diuretics back. He has been turned down for OHTx at [facility] due to renal function and lack of consistent social support. We have been discussing VAD but once again I do not think he will be good candidate for the reasons mentioned above. He feels he would do much better if he was back in NSR but has had persistent LAA clot on TEE and thus not candidate for DC-CV. Will continue current regimen for now. Check renal function next week. If getting worse will have to cut demadex back. Options becoming increasingly limited.

## 2012-04-03 NOTE — Assessment & Plan Note (Signed)
See above. Unable to perform DC-CV due to LAA clot. Continue apixaban. Consider repeat TEE in 4 weeks.

## 2012-04-10 ENCOUNTER — Encounter: Payer: Self-pay | Admitting: Internal Medicine

## 2012-04-15 ENCOUNTER — Encounter: Payer: Self-pay | Admitting: Internal Medicine

## 2012-04-16 ENCOUNTER — Ambulatory Visit (HOSPITAL_COMMUNITY)
Admission: RE | Admit: 2012-04-16 | Discharge: 2012-04-16 | Disposition: A | Discharge status: Home / Self Care | Payer: PRIVATE HEALTH INSURANCE | Source: Ambulatory Visit | Attending: Internal Medicine | Admitting: Internal Medicine

## 2012-04-16 VITALS — BP 96/64 | HR 100 | Wt 225.0 lb

## 2012-04-16 DIAGNOSIS — I509 Heart failure, unspecified: Secondary | ICD-10-CM

## 2012-04-16 DIAGNOSIS — I129 Hypertensive chronic kidney disease with stage 1 through stage 4 chronic kidney disease, or unspecified chronic kidney disease: Secondary | ICD-10-CM | POA: Insufficient documentation

## 2012-04-16 DIAGNOSIS — E7800 Pure hypercholesterolemia, unspecified: Secondary | ICD-10-CM | POA: Insufficient documentation

## 2012-04-16 DIAGNOSIS — M109 Gout, unspecified: Secondary | ICD-10-CM | POA: Insufficient documentation

## 2012-04-16 DIAGNOSIS — Z79899 Other long term (current) drug therapy: Secondary | ICD-10-CM | POA: Insufficient documentation

## 2012-04-16 DIAGNOSIS — I10 Essential (primary) hypertension: Secondary | ICD-10-CM | POA: Insufficient documentation

## 2012-04-16 DIAGNOSIS — N189 Chronic kidney disease, unspecified: Secondary | ICD-10-CM | POA: Insufficient documentation

## 2012-04-16 DIAGNOSIS — I2510 Atherosclerotic heart disease of native coronary artery without angina pectoris: Secondary | ICD-10-CM | POA: Insufficient documentation

## 2012-04-16 DIAGNOSIS — I5022 Chronic systolic (congestive) heart failure: Secondary | ICD-10-CM

## 2012-04-16 DIAGNOSIS — I428 Other cardiomyopathies: Secondary | ICD-10-CM | POA: Insufficient documentation

## 2012-04-16 DIAGNOSIS — I252 Old myocardial infarction: Secondary | ICD-10-CM | POA: Insufficient documentation

## 2012-04-16 DIAGNOSIS — K219 Gastro-esophageal reflux disease without esophagitis: Secondary | ICD-10-CM | POA: Insufficient documentation

## 2012-04-16 DIAGNOSIS — D869 Sarcoidosis, unspecified: Secondary | ICD-10-CM | POA: Insufficient documentation

## 2012-04-16 DIAGNOSIS — I4891 Unspecified atrial fibrillation: Secondary | ICD-10-CM

## 2012-04-16 DIAGNOSIS — Z95810 Presence of automatic (implantable) cardiac defibrillator: Secondary | ICD-10-CM | POA: Insufficient documentation

## 2012-04-16 DIAGNOSIS — Z950 Presence of cardiac pacemaker: Secondary | ICD-10-CM | POA: Insufficient documentation

## 2012-04-16 NOTE — Progress Notes (Signed)
Patient ID: [name], male   DOB: 31-Aug-1950, 62 y.o.   MRN: [id number]  Weight Range    219-221 pounds  Baseline proBNP    511 on 11/13/11    HPI: [name] is a 62 y.o. gentlemen with severe HF due to NICM (EF 20-25%) with multiple hospital admissions for HF exacerbations. He also has history of ventricular tachycardia s/p St Jude ICD by [name], chronic kidney disease: stage III, baseline Cr ~2, and paroxysmal atrial arrhythmias controlled by amiodarone. As well as sarcoidosis, hypertension. Cath in 2008 by Dr. [name] showed no CAD. Dry weight is 219 (11/2011).   Milrinone initiated in January for low output.  RHC 01/26/12  RA = 14  RV = 51/16/15  PA = 57/34 (43)  PCW = 26  Fick cardiac output/index = 4.2/1.8  Thermo CO/CI = 3.9/1.7  PVR = 4.0 Woods  O2 sat = 97%  PA sat = 57%, 58%  Ao Pressure (non-invasive): 116/80 (93)  SVR = 1504   03/01/12 milrinone increased 0.353mc/kg/min.   Returns today for follow-up. Had TEE in February and again on 3/21 for possible DC-CV however showed persistent LA clot so no DC-CV attempted. Seen at [facility] and not felt to be a transplant candidate. Due to renal failure and lack of family support.  04/15/12 Creatinine 2.3 Potassium 4.4  He returns for an acute work in due to progressive fatigue and DOE. Feels like milrinone not working any more.  Weight at home stable at baseline. 217-220 pounds. No ICD shocks. Continue on Milrinone at 0.375 mcg via PICC. Followed by [facility] for home Milrinone and weekly BMET. No bleeding problems.      ROS: All systems negative except as listed in HPI, PMH and Problem List.  Past Medical History  Diagnosis Date  . CHF (congestive heart failure)   . Sarcoidosis   . Cardiomyopathy, dilated, nonischemic     non ischemic by cath  . Acute on chronic systolic heart failure   . Automatic implantable cardiac defibrillator in situ   . Atrial fibrillation   . NSVT (nonsustained ventricular tachycardia)   . GERD  (gastroesophageal reflux disease)   . Hypercholesteremia   . Myocardial infarction   . Shortness of breath   . Chronic kidney disease (CKD), stage III (moderate)   . Pacemaker   . Anginal pain   . Gout   . Hypertension     [name]  . Coronary artery disease     Current Outpatient Prescriptions  Medication Sig Dispense Refill  . allopurinol (ZYLOPRIM) 100 MG tablet Take 1 tablet (100 mg total) by mouth daily.  30 tablet  6  . amiodarone (PACERONE) 200 MG tablet Take 1 tablet (200 mg total) by mouth 2 (two) times daily.  60 tablet  6  . apixaban (ELIQUIS) 5 MG TABS tablet Take 1 tablet (5 mg total) by mouth 2 (two) times daily.  60 tablet  3  . carvedilol (COREG) 3.125 MG tablet Take 1 tablet (3.125 mg total) by mouth 2 (two) times daily with a meal.  60 tablet  1  . isosorbide-hydrALAZINE (BIDIL) 20-37.5 MG per tablet Take 0.5 tablets by mouth 3 (three) times daily.  45 tablet  1  . LORazepam (ATIVAN) 1 MG tablet Take 0.5 tablets (0.5 mg total) by mouth 2 (two) times daily as needed for anxiety.  15 tablet  0  . magnesium oxide (MAG-OX 400) 400 MG tablet Take 1 tablet (400 mg total) by mouth daily.  30 tablet  6  . milrinone (PRIMACOR) 20 MG/100ML SOLN infusion Inject 37.3125 mcg/min into the vein continuous.  100 mL    . ondansetron (ZOFRAN-ODT) 8 MG disintegrating tablet Take 8 mg by mouth every 8 (eight) hours as needed. 8mg  ODT q4 hours prn nausea      . pantoprazole (PROTONIX) 40 MG tablet Take 40 mg by mouth daily.      . polyethylene glycol powder (GLYCOLAX/MIRALAX) powder Take 17 g by mouth 2 (two) times daily. Until daily soft stools  OTC  255 g  0  . rosuvastatin (CRESTOR) 5 MG tablet Take 1 tablet (5 mg total) by mouth at bedtime.      Marland Kitchen spironolactone (ALDACTONE) 25 MG tablet Take 0.5 tablets (12.5 mg total) by mouth daily.  15 tablet  6  . torsemide (DEMADEX) 20 MG tablet 2 tabs in AM and 1 tab in PM       No current facility-administered medications for this  encounter.     PHYSICAL EXAM: Filed Vitals:   04/16/12 1204  BP: 96/64  Pulse: 100  Weight: 225 lb (102.059 kg)  SpO2: 99%    General: Chronically ill appearing. SOB at rest.   HEENT: normal  Neck: supple. JVP flat. Carotids 2+ bilat; no bruits. No lymphadenopathy or thryomegaly appreciated.  Cor: PMI nondisplaced. Irregular rate & rhythm. 3/67m MR. +S3  Lungs: clear Abdomen: soft, nontender, obese, mildly distended, No bruits or masses. Good bowel sounds.  Extremities: no cyanosis, clubbing, rash, no lower extremity edema RUE PICC Neuro: alert & orientedx3, cranial nerves grossly intact. moves all 4 extremities w/o difficulty. Affect pleasant     ASSESSMENT & PLAN:

## 2012-04-16 NOTE — Patient Instructions (Addendum)
Follow-up in 3 weeks

## 2012-04-16 NOTE — Assessment & Plan Note (Addendum)
-   See HF section.

## 2012-04-17 NOTE — Assessment & Plan Note (Signed)
He has endstage HF symptoms despite fairly high dose milrinone. His volume status looks fine. Suspect this is just low output. I had a very frank talk with him that, after a very careful consdieration, we would not be able to offer him VAD due to CRI and lack of social support. I offered to refer him back to [facility] or [facility] for further consideration but he deferred at this time. He feels that he would feel better if he were in NSR. We discussed the risks of stroke or peripheral embolism with DC-CV in the setting of persistent LAA clot/smoke despite several months of full anti-coagulation. He is considering the risks/benefit of this. i told him that despite the small risk given several months of full anti-coagulation I would consider if he wanted to proceed.

## 2012-04-22 ENCOUNTER — Encounter: Payer: Self-pay | Admitting: Internal Medicine

## 2012-04-22 ENCOUNTER — Other Ambulatory Visit (HOSPITAL_COMMUNITY): Payer: Self-pay | Admitting: Internal Medicine

## 2012-04-23 ENCOUNTER — Telehealth (HOSPITAL_COMMUNITY): Payer: Self-pay | Admitting: *Deleted

## 2012-04-23 MED ORDER — METOLAZONE 2.5 MG PO TABS: 2.5000 mg | ORAL_TABLET | ORAL | Status: DC

## 2012-04-23 NOTE — Telephone Encounter (Signed)
Pt called to report that his wt is up to 222 lb, he has increased SOB and his abd is bloated, he states he is also more fatigued than usual, BP today 94/80, he states he has been taking his torsemide 40 mg in AM and 20 mg in PM, discussed all above with Dr Gala Romney he wants pt to take Metolazone 2.5 mg today, he may adjust milrinone on Friday when he does pt's DCCV, pt is aware and agreeable, rx sent in

## 2012-04-25 ENCOUNTER — Telehealth (HOSPITAL_COMMUNITY): Payer: Self-pay | Admitting: *Deleted

## 2012-04-25 ENCOUNTER — Encounter (HOSPITAL_COMMUNITY): Payer: Self-pay | Admitting: *Deleted

## 2012-04-25 ENCOUNTER — Other Ambulatory Visit (HOSPITAL_COMMUNITY): Payer: Self-pay | Admitting: Physician Assistant

## 2012-04-25 NOTE — Telephone Encounter (Signed)
Pt called and wants to have the DCCV done, sch for 4/25 at 1 pm, pt is aware and instructions reviewed with him via phone

## 2012-04-26 ENCOUNTER — Encounter (HOSPITAL_COMMUNITY): Payer: Self-pay | Admitting: Gastroenterology

## 2012-04-26 ENCOUNTER — Ambulatory Visit (HOSPITAL_COMMUNITY): Payer: PRIVATE HEALTH INSURANCE | Admitting: Anesthesiology

## 2012-04-26 ENCOUNTER — Other Ambulatory Visit (HOSPITAL_COMMUNITY): Payer: Self-pay | Admitting: *Deleted

## 2012-04-26 ENCOUNTER — Encounter (HOSPITAL_COMMUNITY): Payer: Self-pay | Admitting: Anesthesiology

## 2012-04-26 ENCOUNTER — Ambulatory Visit (HOSPITAL_COMMUNITY)
Admission: RE | Admit: 2012-04-26 | Discharge: 2012-04-26 | Disposition: A | Discharge status: Home / Self Care | Payer: PRIVATE HEALTH INSURANCE | Source: Ambulatory Visit | Attending: Internal Medicine | Admitting: Internal Medicine

## 2012-04-26 ENCOUNTER — Encounter (HOSPITAL_COMMUNITY)
Admission: RE | Disposition: A | Discharge status: Home / Self Care | Payer: Self-pay | Source: Ambulatory Visit | Attending: Internal Medicine

## 2012-04-26 DIAGNOSIS — I509 Heart failure, unspecified: Secondary | ICD-10-CM | POA: Insufficient documentation

## 2012-04-26 DIAGNOSIS — Z79899 Other long term (current) drug therapy: Secondary | ICD-10-CM | POA: Insufficient documentation

## 2012-04-26 DIAGNOSIS — I129 Hypertensive chronic kidney disease with stage 1 through stage 4 chronic kidney disease, or unspecified chronic kidney disease: Secondary | ICD-10-CM | POA: Insufficient documentation

## 2012-04-26 DIAGNOSIS — E669 Obesity, unspecified: Secondary | ICD-10-CM | POA: Insufficient documentation

## 2012-04-26 DIAGNOSIS — N183 Chronic kidney disease, stage 3 unspecified: Secondary | ICD-10-CM | POA: Insufficient documentation

## 2012-04-26 DIAGNOSIS — I2510 Atherosclerotic heart disease of native coronary artery without angina pectoris: Secondary | ICD-10-CM | POA: Insufficient documentation

## 2012-04-26 DIAGNOSIS — M109 Gout, unspecified: Secondary | ICD-10-CM | POA: Insufficient documentation

## 2012-04-26 DIAGNOSIS — E7800 Pure hypercholesterolemia, unspecified: Secondary | ICD-10-CM | POA: Insufficient documentation

## 2012-04-26 DIAGNOSIS — I4891 Unspecified atrial fibrillation: Secondary | ICD-10-CM

## 2012-04-26 DIAGNOSIS — I428 Other cardiomyopathies: Secondary | ICD-10-CM | POA: Insufficient documentation

## 2012-04-26 DIAGNOSIS — D869 Sarcoidosis, unspecified: Secondary | ICD-10-CM | POA: Insufficient documentation

## 2012-04-26 DIAGNOSIS — I252 Old myocardial infarction: Secondary | ICD-10-CM | POA: Insufficient documentation

## 2012-04-26 DIAGNOSIS — I5043 Acute on chronic combined systolic (congestive) and diastolic (congestive) heart failure: Secondary | ICD-10-CM | POA: Insufficient documentation

## 2012-04-26 DIAGNOSIS — Z6826 Body mass index (BMI) 26.0-26.9, adult: Secondary | ICD-10-CM | POA: Insufficient documentation

## 2012-04-26 DIAGNOSIS — Z7901 Long term (current) use of anticoagulants: Secondary | ICD-10-CM | POA: Insufficient documentation

## 2012-04-26 DIAGNOSIS — Z95810 Presence of automatic (implantable) cardiac defibrillator: Secondary | ICD-10-CM | POA: Insufficient documentation

## 2012-04-26 DIAGNOSIS — K219 Gastro-esophageal reflux disease without esophagitis: Secondary | ICD-10-CM | POA: Insufficient documentation

## 2012-04-26 HISTORY — PX: CARDIOVERSION: SHX1299

## 2012-04-26 SURGERY — CARDIOVERSION
Anesthesia: Monitor Anesthesia Care

## 2012-04-26 MED ORDER — ETOMIDATE 2 MG/ML IV SOLN
INTRAVENOUS | Status: DC | PRN
  Administered 2012-04-26: 10 mg via INTRAVENOUS

## 2012-04-26 MED ORDER — SODIUM CHLORIDE 0.9 % IJ SOLN
10.0000 mL | INTRAMUSCULAR | Status: DC | PRN
  Administered 2012-04-26: 10 mL

## 2012-04-26 MED ORDER — MAGNESIUM OXIDE 400 MG PO TABS: 400.0000 mg | ORAL_TABLET | Freq: Every day | ORAL | Status: DC

## 2012-04-26 MED ORDER — ONDANSETRON HCL 4 MG/2ML IJ SOLN
INTRAMUSCULAR | Status: AC
  Filled 2012-04-26: qty 2

## 2012-04-26 MED ORDER — HEPARIN SOD (PORK) LOCK FLUSH 100 UNIT/ML IV SOLN
250.0000 [IU] | INTRAVENOUS | Status: AC | PRN
  Administered 2012-04-26: 250 [IU]

## 2012-04-26 MED ORDER — SODIUM CHLORIDE 0.9 % IV SOLN: INTRAVENOUS | Status: DC

## 2012-04-26 NOTE — Transfer of Care (Signed)
Immediate Anesthesia Transfer of Care Note  Patient: [name]  Procedure(s) Performed: Procedure(s): CARDIOVERSION (N/A)  Patient Location: Endoscopy Unit  Anesthesia Type:General  Level of Consciousness: responds to stimulation  Airway & Oxygen Therapy: Patient Spontanous Breathing and Patient connected to nasal cannula oxygen  Post-op Assessment: Report given to PACU RN and Post -op Vital signs reviewed and stable  Post vital signs: Reviewed and stable  Complications: No apparent anesthesia complications

## 2012-04-26 NOTE — Interval H&P Note (Signed)
History and Physical Interval Note:  04/26/2012 1:30 PM  [name]  has presented today for surgery, with the diagnosis of a-fib  The various methods of treatment have been discussed with the patient and family. After consideration of risks, benefits and other options for treatment, the patient has consented to  Procedure(s): CARDIOVERSION (N/A) as a surgical intervention .  The patient's history has been reviewed, patient examined, no change in status, stable for surgery.  I have reviewed the patient's chart and labs.  Questions were answered to the patient's satisfaction.     Hezekiah Veltre

## 2012-04-26 NOTE — H&P (View-Only) (Signed)
Patient ID: [name], male   DOB: 09/28/1950, 62 y.o.   MRN: [id number]  Weight Range    219-221 pounds  Baseline proBNP    511 on 11/13/11    HPI: [name] is a 62 y.o. gentlemen with severe HF due to NICM (EF 20-25%) with multiple hospital admissions for HF exacerbations. He also has history of ventricular tachycardia s/p St Jude ICD by Dr. Taylor, chronic kidney disease: stage III, baseline Cr ~2, and paroxysmal atrial arrhythmias controlled by amiodarone. As well as sarcoidosis, hypertension. Cath in 2008 by Dr. Harwani showed no CAD. Dry weight is 219 (11/2011).   Milrinone initiated in January for low output.  RHC 01/26/12  RA = 14  RV = 51/16/15  PA = 57/34 (43)  PCW = 26  Fick cardiac output/index = 4.2/1.8  Thermo CO/CI = 3.9/1.7  PVR = 4.0 Woods  O2 sat = 97%  PA sat = 57%, 58%  Ao Pressure (non-invasive): 116/80 (93)  SVR = 1504   03/01/12 milrinone increased 0.375mc/kg/min.   Returns today for follow-up. Had TEE in February and again on 3/21 for possible DC-CV however showed persistent LA clot so no DC-CV attempted. Seen at [facility] and not felt to be a transplant candidate. Due to renal failure and lack of family support.  04/15/12 Creatinine 2.3 Potassium 4.4  He returns for an acute work in due to progressive fatigue and DOE. Feels like milrinone not working any more.  Weight at home stable at baseline. 217-220 pounds. No ICD shocks. Continue on Milrinone at 0.375 mcg via PICC. Followed by [facility] for home Milrinone and weekly BMET. No bleeding problems.      ROS: All systems negative except as listed in HPI, PMH and Problem List.  Past Medical History  Diagnosis Date  . CHF (congestive heart failure)   . Sarcoidosis   . Cardiomyopathy, dilated, nonischemic     non ischemic by cath  . Acute on chronic systolic heart failure   . Automatic implantable cardiac defibrillator in situ   . Atrial fibrillation   . NSVT (nonsustained ventricular tachycardia)   . GERD  (gastroesophageal reflux disease)   . Hypercholesteremia   . Myocardial infarction   . Shortness of breath   . Chronic kidney disease (CKD), stage III (moderate)   . Pacemaker   . Anginal pain   . Gout   . Hypertension     dr harwarnia  . Coronary artery disease     Current Outpatient Prescriptions  Medication Sig Dispense Refill  . allopurinol (ZYLOPRIM) 100 MG tablet Take 1 tablet (100 mg total) by mouth daily.  30 tablet  6  . amiodarone (PACERONE) 200 MG tablet Take 1 tablet (200 mg total) by mouth 2 (two) times daily.  60 tablet  6  . apixaban (ELIQUIS) 5 MG TABS tablet Take 1 tablet (5 mg total) by mouth 2 (two) times daily.  60 tablet  3  . carvedilol (COREG) 3.125 MG tablet Take 1 tablet (3.125 mg total) by mouth 2 (two) times daily with a meal.  60 tablet  1  . isosorbide-hydrALAZINE (BIDIL) 20-37.5 MG per tablet Take 0.5 tablets by mouth 3 (three) times daily.  45 tablet  1  . LORazepam (ATIVAN) 1 MG tablet Take 0.5 tablets (0.5 mg total) by mouth 2 (two) times daily as needed for anxiety.  15 tablet  0  . magnesium oxide (MAG-OX 400) 400 MG tablet Take 1 tablet (400 mg total) by mouth daily.    30 tablet  6  . milrinone (PRIMACOR) 20 MG/100ML SOLN infusion Inject 37.3125 mcg/min into the vein continuous.  100 mL    . ondansetron (ZOFRAN-ODT) 8 MG disintegrating tablet Take 8 mg by mouth every 8 (eight) hours as needed. 8mg ODT q4 hours prn nausea      . pantoprazole (PROTONIX) 40 MG tablet Take 40 mg by mouth daily.      . polyethylene glycol powder (GLYCOLAX/MIRALAX) powder Take 17 g by mouth 2 (two) times daily. Until daily soft stools  OTC  255 g  0  . rosuvastatin (CRESTOR) 5 MG tablet Take 1 tablet (5 mg total) by mouth at bedtime.      . spironolactone (ALDACTONE) 25 MG tablet Take 0.5 tablets (12.5 mg total) by mouth daily.  15 tablet  6  . torsemide (DEMADEX) 20 MG tablet 2 tabs in AM and 1 tab in PM       No current facility-administered medications for this  encounter.     PHYSICAL EXAM: Filed Vitals:   04/16/12 1204  BP: 96/64  Pulse: 100  Weight: 225 lb (102.059 kg)  SpO2: 99%    General: Chronically ill appearing. SOB at rest.   HEENT: normal  Neck: supple. JVP flat. Carotids 2+ bilat; no bruits. No lymphadenopathy or thryomegaly appreciated.  Cor: PMI nondisplaced. Irregular rate & rhythm. 3/6m MR. +S3  Lungs: clear Abdomen: soft, nontender, obese, mildly distended, No bruits or masses. Good bowel sounds.  Extremities: no cyanosis, clubbing, rash, no lower extremity edema RUE PICC Neuro: alert & orientedx3, cranial nerves grossly intact. moves all 4 extremities w/o difficulty. Affect pleasant     ASSESSMENT & PLAN:   

## 2012-04-26 NOTE — Anesthesia Preprocedure Evaluation (Addendum)
Anesthesia Evaluation  Patient identified by MRN, date of birth, ID band Patient awake    Reviewed: Allergy & Precautions, H&P , NPO status , Patient's Chart, lab work & pertinent test results  Airway Mallampati: II TM Distance: >3 FB Neck ROM: Full    Dental   Pulmonary shortness of breath and with exertion,  breath sounds clear to auscultation        Cardiovascular hypertension, Pt. on medications + angina with exertion + CAD, + Past MI and +CHF + dysrhythmias Atrial Fibrillation + pacemaker Rhythm:Irregular     Neuro/Psych PSYCHIATRIC DISORDERS Anxiety    GI/Hepatic GERD-  Medicated and Controlled,  Endo/Other    Renal/GU Renal disease     Musculoskeletal   Abdominal   Peds  Hematology   Anesthesia Other Findings   Reproductive/Obstetrics                          Anesthesia Physical Anesthesia Plan  ASA: III  Anesthesia Plan: General   Post-op Pain Management:    Induction: Intravenous  Airway Management Planned: Mask  Additional Equipment:   Intra-op Plan:   Post-operative Plan:   Informed Consent: I have reviewed the patients History and Physical, chart, labs and discussed the procedure including the risks, benefits and alternatives for the proposed anesthesia with the patient or authorized representative who has indicated his/her understanding and acceptance.   Dental advisory given  Plan Discussed with:   Anesthesia Plan Comments:         Anesthesia Quick Evaluation

## 2012-04-26 NOTE — Anesthesia Postprocedure Evaluation (Signed)
  Anesthesia Post-op Note  Patient: [name]  Procedure(s) Performed: Procedure(s): CARDIOVERSION (N/A)  Patient Location: PACU and Endoscopy Unit  Anesthesia Type:General  Level of Consciousness: awake and oriented  Airway and Oxygen Therapy: Patient Spontanous Breathing  Post-op Pain: none  Post-op Assessment: Post-op Vital signs reviewed, Patient's Cardiovascular Status Stable, Respiratory Function Stable, Patent Airway and Pain level controlled  Post-op Vital Signs: stable  Complications: No apparent anesthesia complications

## 2012-04-26 NOTE — CV Procedure (Signed)
     DIRECT CURRENT CARDIOVERSION  NAME:  [name]   MRN: [id number] DOB:  04-02-1950   ADMIT DATE: 04/26/2012   INDICATIONS: Atrial fibrillation    PROCEDURE:   Informed consent was obtained prior to the procedure. The risks, benefits and alternatives for the procedure were discussed and the patient comprehended these risks. Due to his h/o LAA clot, we specifically addressed the risk of stroke several times but felt the benefits of DC-CV outweighed the risks in light of him being anticoagulated fully for several months. Once an appropriate time out was taken, the patient had the defibrillator pads placed in the anterior and posterior position. The patient then underwent sedation by the anesthesia service. Once an appropriate level of sedation was achieved, the patient received a single biphasic, synchronized 200J shock with prompt conversion to sinus rhythm. No apparent complications.   CONLCUSION:   1.  Successful DC-CV  Truman Hayward 1:37 PM

## 2012-04-29 ENCOUNTER — Ambulatory Visit (HOSPITAL_COMMUNITY)
Admission: RE | Admit: 2012-04-29 | Discharge: 2012-04-29 | Disposition: A | Discharge status: Home / Self Care | Payer: PRIVATE HEALTH INSURANCE | Source: Ambulatory Visit | Attending: Internal Medicine | Admitting: Internal Medicine

## 2012-04-29 ENCOUNTER — Other Ambulatory Visit (HOSPITAL_COMMUNITY): Payer: Self-pay | Admitting: Internal Medicine

## 2012-04-29 ENCOUNTER — Encounter (HOSPITAL_COMMUNITY): Payer: Self-pay

## 2012-04-29 ENCOUNTER — Encounter (HOSPITAL_COMMUNITY): Payer: Self-pay | Admitting: Internal Medicine

## 2012-04-29 ENCOUNTER — Telehealth (HOSPITAL_COMMUNITY): Payer: Self-pay | Admitting: *Deleted

## 2012-04-29 DIAGNOSIS — I5022 Chronic systolic (congestive) heart failure: Secondary | ICD-10-CM

## 2012-04-29 DIAGNOSIS — I509 Heart failure, unspecified: Secondary | ICD-10-CM | POA: Insufficient documentation

## 2012-04-29 NOTE — Telephone Encounter (Signed)
Pt's PICC line came out last night in his sleep, he is scheduled to have a new one placed today at Radiology, he is aware to come now and be prepared to wait

## 2012-04-30 ENCOUNTER — Telehealth (HOSPITAL_COMMUNITY): Payer: Self-pay | Admitting: Adult Health

## 2012-04-30 ENCOUNTER — Encounter: Payer: Self-pay | Admitting: Internal Medicine

## 2012-04-30 NOTE — Telephone Encounter (Signed)
Provided with lab results. Instructed to hold diuretics and Magnesium for 2 days. Resume diuretics on 5/4 and cut magnesium back to 200 mg daily.   Repeat BMET Monday 05/06/12. Mr Rosko verbalized understanding.   CLEGG,AMY  NP-C  11:12 AM

## 2012-05-08 ENCOUNTER — Other Ambulatory Visit (HOSPITAL_COMMUNITY): Payer: Self-pay | Admitting: Internal Medicine

## 2012-05-08 ENCOUNTER — Ambulatory Visit (HOSPITAL_COMMUNITY)
Admission: RE | Admit: 2012-05-08 | Discharge: 2012-05-08 | Disposition: A | Discharge status: Home / Self Care | Payer: PRIVATE HEALTH INSURANCE | Source: Ambulatory Visit | Attending: Internal Medicine | Admitting: Internal Medicine

## 2012-05-08 ENCOUNTER — Encounter (HOSPITAL_COMMUNITY): Payer: Self-pay | Admitting: *Deleted

## 2012-05-08 ENCOUNTER — Emergency Department (HOSPITAL_COMMUNITY)
Admission: EM | Admit: 2012-05-08 | Discharge: 2012-05-08 | Disposition: A | Discharge status: Home / Self Care | Payer: PRIVATE HEALTH INSURANCE | Attending: Emergency Medicine | Admitting: Emergency Medicine

## 2012-05-08 VITALS — BP 90/64 | HR 82 | Wt 228.5 lb

## 2012-05-08 DIAGNOSIS — N183 Chronic kidney disease, stage 3 unspecified: Secondary | ICD-10-CM | POA: Insufficient documentation

## 2012-05-08 DIAGNOSIS — I509 Heart failure, unspecified: Secondary | ICD-10-CM | POA: Insufficient documentation

## 2012-05-08 DIAGNOSIS — K219 Gastro-esophageal reflux disease without esophagitis: Secondary | ICD-10-CM | POA: Insufficient documentation

## 2012-05-08 DIAGNOSIS — I252 Old myocardial infarction: Secondary | ICD-10-CM | POA: Insufficient documentation

## 2012-05-08 DIAGNOSIS — I2510 Atherosclerotic heart disease of native coronary artery without angina pectoris: Secondary | ICD-10-CM | POA: Insufficient documentation

## 2012-05-08 DIAGNOSIS — M109 Gout, unspecified: Secondary | ICD-10-CM | POA: Insufficient documentation

## 2012-05-08 DIAGNOSIS — M10071 Idiopathic gout, right ankle and foot: Secondary | ICD-10-CM

## 2012-05-08 DIAGNOSIS — Z8619 Personal history of other infectious and parasitic diseases: Secondary | ICD-10-CM | POA: Insufficient documentation

## 2012-05-08 DIAGNOSIS — I129 Hypertensive chronic kidney disease with stage 1 through stage 4 chronic kidney disease, or unspecified chronic kidney disease: Secondary | ICD-10-CM | POA: Insufficient documentation

## 2012-05-08 DIAGNOSIS — Z79899 Other long term (current) drug therapy: Secondary | ICD-10-CM | POA: Insufficient documentation

## 2012-05-08 DIAGNOSIS — Z9861 Coronary angioplasty status: Secondary | ICD-10-CM | POA: Insufficient documentation

## 2012-05-08 DIAGNOSIS — Z95810 Presence of automatic (implantable) cardiac defibrillator: Secondary | ICD-10-CM | POA: Insufficient documentation

## 2012-05-08 DIAGNOSIS — I4891 Unspecified atrial fibrillation: Secondary | ICD-10-CM

## 2012-05-08 DIAGNOSIS — K0889 Other specified disorders of teeth and supporting structures: Secondary | ICD-10-CM

## 2012-05-08 DIAGNOSIS — E7800 Pure hypercholesterolemia, unspecified: Secondary | ICD-10-CM | POA: Insufficient documentation

## 2012-05-08 DIAGNOSIS — K089 Disorder of teeth and supporting structures, unspecified: Secondary | ICD-10-CM | POA: Insufficient documentation

## 2012-05-08 DIAGNOSIS — I5022 Chronic systolic (congestive) heart failure: Secondary | ICD-10-CM

## 2012-05-08 DIAGNOSIS — Z8679 Personal history of other diseases of the circulatory system: Secondary | ICD-10-CM | POA: Insufficient documentation

## 2012-05-08 DIAGNOSIS — Z950 Presence of cardiac pacemaker: Secondary | ICD-10-CM | POA: Insufficient documentation

## 2012-05-08 DIAGNOSIS — I5023 Acute on chronic systolic (congestive) heart failure: Secondary | ICD-10-CM | POA: Insufficient documentation

## 2012-05-08 MED ORDER — OXYCODONE-ACETAMINOPHEN 5-325 MG PO TABS
1.0000 | ORAL_TABLET | Freq: Once | ORAL | Status: AC
  Administered 2012-05-08: 1 via ORAL
  Filled 2012-05-08: qty 1

## 2012-05-08 MED ORDER — OXYCODONE-ACETAMINOPHEN 5-325 MG PO TABS: 1.0000 | ORAL_TABLET | ORAL | Status: DC | PRN

## 2012-05-08 MED ORDER — PREDNISONE 10 MG PO TABS: 20.0000 mg | ORAL_TABLET | Freq: Every day | ORAL | Status: DC

## 2012-05-08 MED ORDER — AMOXICILLIN 500 MG PO CAPS
1000.0000 mg | ORAL_CAPSULE | Freq: Once | ORAL | Status: AC
  Administered 2012-05-08: 1000 mg via ORAL
  Filled 2012-05-08: qty 2

## 2012-05-08 MED ORDER — PREDNISONE 20 MG PO TABS
60.0000 mg | ORAL_TABLET | Freq: Once | ORAL | Status: AC
  Administered 2012-05-08: 60 mg via ORAL
  Filled 2012-05-08: qty 3

## 2012-05-08 MED ORDER — AMOXICILLIN 500 MG PO CAPS: 1000.0000 mg | ORAL_CAPSULE | Freq: Two times a day (BID) | ORAL | Status: DC

## 2012-05-08 NOTE — Patient Instructions (Addendum)
Follow up 1 month.  Do the following things EVERYDAY: 1) Weigh yourself in the morning before breakfast. Write it down and keep it in a log. 2) Take your medicines as prescribed 3) Eat low salt foods-Limit salt (sodium) to 2000 mg per day.  4) Stay as active as you can everyday 5) Limit all fluids for the day to less than 2 liters  

## 2012-05-08 NOTE — Progress Notes (Signed)
Patient ID: [name], male   DOB: [birth year], 62 y.o.   MRN: [id number]  Weight Range    219-221 pounds  Baseline proBNP    511 on 11/13/11    HPI: [name] is a 62 y.o. gentlemen with severe HF due to NICM (EF 20-25%) with multiple hospital admissions for HF exacerbations. He also has history of ventricular tachycardia s/p St Jude ICD by [name], chronic kidney disease: stage III, baseline Cr ~2, and paroxysmal atrial arrhythmias controlled by amiodarone. As well as sarcoidosis, hypertension. Cath in 2008 by Dr. [name] showed no CAD. Dry weight is 219 (11/2011). Seen at [facility] and not felt to be a transplant candidate. Due to renal failure and lack of family support.  Milrinone initiated in January 2014 for low output. 03/01/12 milrinone increased 0.369mc/kg/min.   RHC 01/26/12  RA = 14  RV = 51/16/15  PA = 57/34 (43)  PCW = 26  Fick cardiac output/index = 4.2/1.8  Thermo CO/CI = 3.9/1.7  PVR = 4.0 Woods  O2 sat = 97%  PA sat = 57%, 58%  Ao Pressure (non-invasive): 116/80 (93)  SVR = 1504   04/15/12 Creatinine 2.3 Potassium 4.4 04/22/12 Creatinine 2.55  04/26/12 S/P Successful DC-CV  He returns for follow up. 04/30/12 Diuretics and Magnesium held for 2 days. Mild dyspnea noted on inclined surfaces. He is able to walk 4 blocks. Denies PND. + Orthopnea (2 pillows) Weight at home 217-221 pounds.  He continues on home Milrinone at 0.375 mcg. No ICD shocks. Followed by [facility] for home Milrinone and weekly BMET. [facility] unable to draw labs this week. No bleeding problems.    Seen at [facility] and not felt to be a transplant candidate. Due to renal failure and lack of family support.   ROS: All systems negative except as listed in HPI, PMH and Problem List.  Past Medical History  Diagnosis Date  . CHF (congestive heart failure)   . Sarcoidosis   . Cardiomyopathy, dilated, nonischemic     non ischemic by cath  . Acute on chronic systolic heart failure   . Automatic implantable cardiac  defibrillator in situ   . Atrial fibrillation   . NSVT (nonsustained ventricular tachycardia)   . GERD (gastroesophageal reflux disease)   . Hypercholesteremia   . Myocardial infarction   . Shortness of breath   . Chronic kidney disease (CKD), stage III (moderate)   . Pacemaker   . Anginal pain   . Gout   . Hypertension     [name]  . Coronary artery disease     Current Outpatient Prescriptions  Medication Sig Dispense Refill  . allopurinol (ZYLOPRIM) 100 MG tablet Take 1 tablet (100 mg total) by mouth daily.  30 tablet  6  . amiodarone (PACERONE) 200 MG tablet Take 1 tablet (200 mg total) by mouth 2 (two) times daily.  60 tablet  6  . apixaban (ELIQUIS) 5 MG TABS tablet Take 1 tablet (5 mg total) by mouth 2 (two) times daily.  60 tablet  3  . carvedilol (COREG) 3.125 MG tablet Take 1 tablet (3.125 mg total) by mouth 2 (two) times daily with a meal.  60 tablet  1  . isosorbide-hydrALAZINE (BIDIL) 20-37.5 MG per tablet Take 0.5 tablets by mouth 3 (three) times daily.  45 tablet  1  . LORazepam (ATIVAN) 1 MG tablet Take 0.5 tablets (0.5 mg total) by mouth 2 (two) times daily as needed for anxiety.  15 tablet  0  .  magnesium oxide (MAG-OX 400) 400 MG tablet Take 1 tablet (400 mg total) by mouth daily.  90 tablet  3  . metolazone (ZAROXOLYN) 2.5 MG tablet Take 1 tablet (2.5 mg total) by mouth as directed.  5 tablet  1  . milrinone (PRIMACOR) 20 MG/100ML SOLN infusion Inject 37.3125 mcg/min into the vein continuous.  100 mL    . ondansetron (ZOFRAN-ODT) 8 MG disintegrating tablet Take 8 mg by mouth every 8 (eight) hours as needed. 8mg  ODT q4 hours prn nausea      . pantoprazole (PROTONIX) 40 MG tablet Take 40 mg by mouth daily.      . polyethylene glycol powder (GLYCOLAX/MIRALAX) powder Take 17 g by mouth 2 (two) times daily. Until daily soft stools  OTC  255 g  0  . rosuvastatin (CRESTOR) 5 MG tablet Take 1 tablet (5 mg total) by mouth at bedtime.      Marland Kitchen spironolactone (ALDACTONE)  25 MG tablet Take 0.5 tablets (12.5 mg total) by mouth daily.  15 tablet  6  . torsemide (DEMADEX) 20 MG tablet 2 tabs in AM and 1 tab in PM       No current facility-administered medications for this encounter.     PHYSICAL EXAM: Filed Vitals:   05/08/12 0948  BP: 90/64  Pulse: 82  Weight: 228 lb 8 oz (103.647 kg)  SpO2: 99%    General: Chronically ill appearing. NAD   HEENT: normal  Neck: supple. JVP flat. Carotids 2+ bilat; no bruits. No lymphadenopathy or thryomegaly appreciated.  Cor: PMI nondisplaced. Regular rate & rhythm. 3/39m MR. +S3  Lungs: clear Abdomen: soft, nontender, obese, mildly distended, No bruits or masses. Good bowel sounds.  Extremities: no cyanosis, clubbing, rash, no lower extremity edema RUE PICC Neuro: alert & orientedx3, cranial nerves grossly intact. moves all 4 extremities w/o difficulty. Affect pleasant   EKG: SR 82 bpm  ASSESSMENT & PLAN:

## 2012-05-08 NOTE — Assessment & Plan Note (Signed)
Functionally he is doing better post DC-CV. Able to walk 4 blocks. NYHA IIIB. Volume status stable. Continue current diuretic regimen. Will not titrate HF meds due to soft bp. Continue Milrinone at 0.375 mcg via PICC. As noted in previous visits he is not a candidate for LVAD due to renal failure and lack of social support.Contacted [facility] to draw BMET today and fax results. He is not on Ace due to renal failure. Follow up in 1 month.

## 2012-05-08 NOTE — Assessment & Plan Note (Signed)
S/P successful DC/CV. Remains in NSR. Continue amiodarone at 200 mg bid. Will decrease to 200 mg daily at next visit if he remains in NSR.

## 2012-05-08 NOTE — Telephone Encounter (Signed)
Not a pt at [facility]; Please send back to the pharmacy  Thanks

## 2012-05-08 NOTE — ED Notes (Signed)
Pt reports pain to left side of face and head x 2 days and gout pain to feet. No acute distress noted at triage.

## 2012-05-08 NOTE — ED Provider Notes (Signed)
History     CSN: [id number]  Arrival date & time 05/08/12  1027   First MD Initiated Contact with Patient 05/08/12 1035      Chief Complaint  Patient presents with  . Headache  . Gout    (Consider location/radiation/quality/duration/timing/severity/associated sxs/prior treatment) Patient is a 62 y.o. male presenting with headaches. The history is provided by the patient.  Headache He has been having left facial pain for the last 2 days. Pain is moderate to severe and he rates it at 8/10. He has a history of having abscessed teeth which cause similar pain in the past. He was unable to get them extracted because it was too expensive. Nothing makes the pain better and nothing makes it worse. He has tried taking ibuprofen with little relief. It did a, he has developed pain in his right first toe typical of his gout. Pain is severe and he rates it at 10/10. He denies fever, chills, sweats. Denies nausea, vomiting, diarrhea.  Past Medical History  Diagnosis Date  . CHF (congestive heart failure)   . Sarcoidosis   . Cardiomyopathy, dilated, nonischemic     non ischemic by cath  . Acute on chronic systolic heart failure   . Automatic implantable cardiac defibrillator in situ   . Atrial fibrillation   . NSVT (nonsustained ventricular tachycardia)   . GERD (gastroesophageal reflux disease)   . Hypercholesteremia   . Myocardial infarction   . Shortness of breath   . Chronic kidney disease (CKD), stage III (moderate)   . Pacemaker   . Anginal pain   . Gout   . Hypertension     [name]  . Coronary artery disease     Past Surgical History  Procedure Laterality Date  . Back surgery  1987    Ruptured disk repair  . Pacemaker insertion      with ICD  . Tee without cardioversion  01/17/2011    Procedure: TRANSESOPHAGEAL ECHOCARDIOGRAM (TEE);  Surgeon: Ricki Rodriguez, MD;  Location: [facility] ENDOSCOPY;  Service: Cardiovascular;  Laterality: N/A;  . Cardioversion  01/17/2011    Procedure:  CARDIOVERSION;  Surgeon: Ricki Rodriguez, MD;  Location: [facility] ENDOSCOPY;  Service: Cardiovascular;  Laterality: N/A;  . Cardiac catheterization    . Insert / replace / remove pacemaker    . Anterior cervical decomp/discectomy fusion  08/21/2011    Procedure: ANTERIOR CERVICAL DECOMPRESSION/DISCECTOMY FUSION 2 LEVELS;  Surgeon: Eldred Manges, MD;  Location: [facility] OR;  Service: Orthopedics;  Laterality: N/A;  C5-6, C6-7 Anterior Cervical Discectomy and Fusion, allograft, plate  . Tee without cardioversion N/A 02/16/2012    Procedure: TRANSESOPHAGEAL ECHOCARDIOGRAM (TEE);  Surgeon: Dolores Patty, MD;  Location: [facility] ENDOSCOPY;  Service: Cardiovascular;  Laterality: N/A;  original case scheduled under his dad (who is deceased), rescheduled under correct mrn/pt/dob. [place]/dl  . Tee without cardioversion N/A 03/22/2012    Procedure: TRANSESOPHAGEAL ECHOCARDIOGRAM (TEE);  Surgeon: Lewayne Bunting, MD;  Location: [facility] ENDOSCOPY;  Service: Cardiovascular;  Laterality: N/A;  . Cardioversion N/A 03/22/2012    Procedure: CARDIOVERSION;  Surgeon: Lewayne Bunting, MD;  Location: [facility] ENDOSCOPY;  Service: Cardiovascular;  Laterality: N/A;  . Cardioversion N/A 04/26/2012    Procedure: CARDIOVERSION;  Surgeon: Dolores Patty, MD;  Location: [facility] ENDOSCOPY;  Service: Cardiovascular;  Laterality: N/A;    Family History  Problem Relation Age of Onset  . Heart disease    . Heart failure    . Stroke    . Anesthesia problems Neg  Hx   . Hypotension Neg Hx   . Malignant hyperthermia Neg Hx   . Pseudochol deficiency Neg Hx     History  Substance Use Topics  . Smoking status: Never Smoker   . Smokeless tobacco: Never Used  . Alcohol Use: No      Review of Systems  Neurological: Positive for headaches.  All other systems reviewed and are negative.    Allergies  Review of patient's allergies indicates no known allergies.  Home Medications   Current Outpatient Rx  Name  Route  Sig  Dispense  Refill  .  allopurinol (ZYLOPRIM) 100 MG tablet   Oral   Take 1 tablet (100 mg total) by mouth daily.   30 tablet   6   . amiodarone (PACERONE) 200 MG tablet   Oral   Take 1 tablet (200 mg total) by mouth 2 (two) times daily.   60 tablet   6   . apixaban (ELIQUIS) 5 MG TABS tablet   Oral   Take 1 tablet (5 mg total) by mouth 2 (two) times daily.   60 tablet   3   . carvedilol (COREG) 3.125 MG tablet   Oral   Take 1 tablet (3.125 mg total) by mouth 2 (two) times daily with a meal.   60 tablet   1   . isosorbide-hydrALAZINE (BIDIL) 20-37.5 MG per tablet   Oral   Take 0.5 tablets by mouth 3 (three) times daily.   45 tablet   1   . LORazepam (ATIVAN) 1 MG tablet   Oral   Take 0.5 tablets (0.5 mg total) by mouth 2 (two) times daily as needed for anxiety.   15 tablet   0   . magnesium oxide (MAG-OX 400) 400 MG tablet   Oral   Take 1 tablet (400 mg total) by mouth daily.   90 tablet   3   . metolazone (ZAROXOLYN) 2.5 MG tablet   Oral   Take 1 tablet (2.5 mg total) by mouth as directed.   5 tablet   1   . milrinone (PRIMACOR) 20 MG/100ML SOLN infusion   Intravenous   Inject 37.3125 mcg/min into the vein continuous.   100 mL      . ondansetron (ZOFRAN-ODT) 8 MG disintegrating tablet   Oral   Take 8 mg by mouth every 8 (eight) hours as needed for nausea. 8mg  ODT q4 hours prn nausea         . pantoprazole (PROTONIX) 40 MG tablet   Oral   Take 40 mg by mouth daily.         . polyethylene glycol powder (GLYCOLAX/MIRALAX) powder   Oral   Take 17 g by mouth 2 (two) times daily. Until daily soft stools  OTC   255 g   0   . rosuvastatin (CRESTOR) 5 MG tablet   Oral   Take 1 tablet (5 mg total) by mouth at bedtime.         Marland Kitchen spironolactone (ALDACTONE) 25 MG tablet   Oral   Take 0.5 tablets (12.5 mg total) by mouth daily.   15 tablet   6   . torsemide (DEMADEX) 20 MG tablet   Oral   Take 20-40 mg by mouth 2 (two) times daily. 2 tabs in AM and 1 tab in PM          . amoxicillin (AMOXIL) 500 MG capsule   Oral   Take 2 capsules (1,000 mg total) by mouth  2 (two) times daily.   40 capsule   0   . oxyCODONE-acetaminophen (PERCOCET/ROXICET) 5-325 MG per tablet   Oral   Take 1 tablet by mouth every 4 (four) hours as needed for pain.   20 tablet   0   . predniSONE (DELTASONE) 10 MG tablet   Oral   Take 2 tablets (20 mg total) by mouth daily.   15 tablet   0     BP 99/71  Pulse 86  Temp(Src) 97.8 F (36.6 C)  Resp 20  SpO2 97%  Physical Exam  Nursing note and vitals reviewed.  62 year old male, resting comfortably and in no acute distress. Vital signs are normal. Oxygen saturation is 97%, which is normal. Head is normocephalic and atraumatic. PERRLA, EOMI. Oropharynx is clear. Dentition is generally poor. There is no obvious gingival swelling or pallor or erythema, but teeth #13, 14, 15 are tender to percussion. Neck is nontender and supple without adenopathy or JVD. Back is nontender and there is no CVA tenderness. Lungs are clear without rales, wheezes, or rhonchi. Chest is nontender. Heart has regular rate and rhythm without murmur. Abdomen is soft, flat, nontender without masses or hepatosplenomegaly and peristalsis is normoactive. Extremities have no cyanosis or edema, full range of motion is present. There is swelling, erythema, warmth, tenderness at the right first MTP joint consistent with gout. Skin is warm and dry without rash. Neurologic: Mental status is normal, cranial nerves are intact, there are no motor or sensory deficits.  ED Course  Procedures (including critical care time)  Labs Reviewed - No data to display No results found.   1. Pain, dental   2. Gouty arthritis of toe, right       MDM  Exacerbation of gout. Facial pain secondary to dental pain with possible occult infection. He is discharged with prescriptions for amoxicillin, prednisone, Percocet. He is to continue taking ibuprofen for her to the  on-call dentist. Old records of been reviewed and he has prior ED visits for dental pain and for gout. He is followed in the heart failure clinic.        Dione Booze, MD 05/08/12 [phone]

## 2012-05-09 NOTE — Addendum Note (Signed)
Encounter addended by: Simon Rhein, CCT on: 05/09/2012  8:37 AM<BR>     Documentation filed: Charges VN

## 2012-05-15 ENCOUNTER — Other Ambulatory Visit (HOSPITAL_COMMUNITY): Payer: Self-pay | Admitting: *Deleted

## 2012-05-15 MED ORDER — METOLAZONE 2.5 MG PO TABS: 2.5000 mg | ORAL_TABLET | ORAL | Status: DC

## 2012-05-15 MED ORDER — TORSEMIDE 20 MG PO TABS: ORAL_TABLET | ORAL | Status: DC

## 2012-05-20 IMAGING — CR DG CHEST 2V
2 series · 2 of 2 positions shown · non-contrast
Comparison: 10/17/2010.

CLINICAL DATA: Chest pain with weakness tonight.

CHEST - 2 VIEW

[w chest pa]
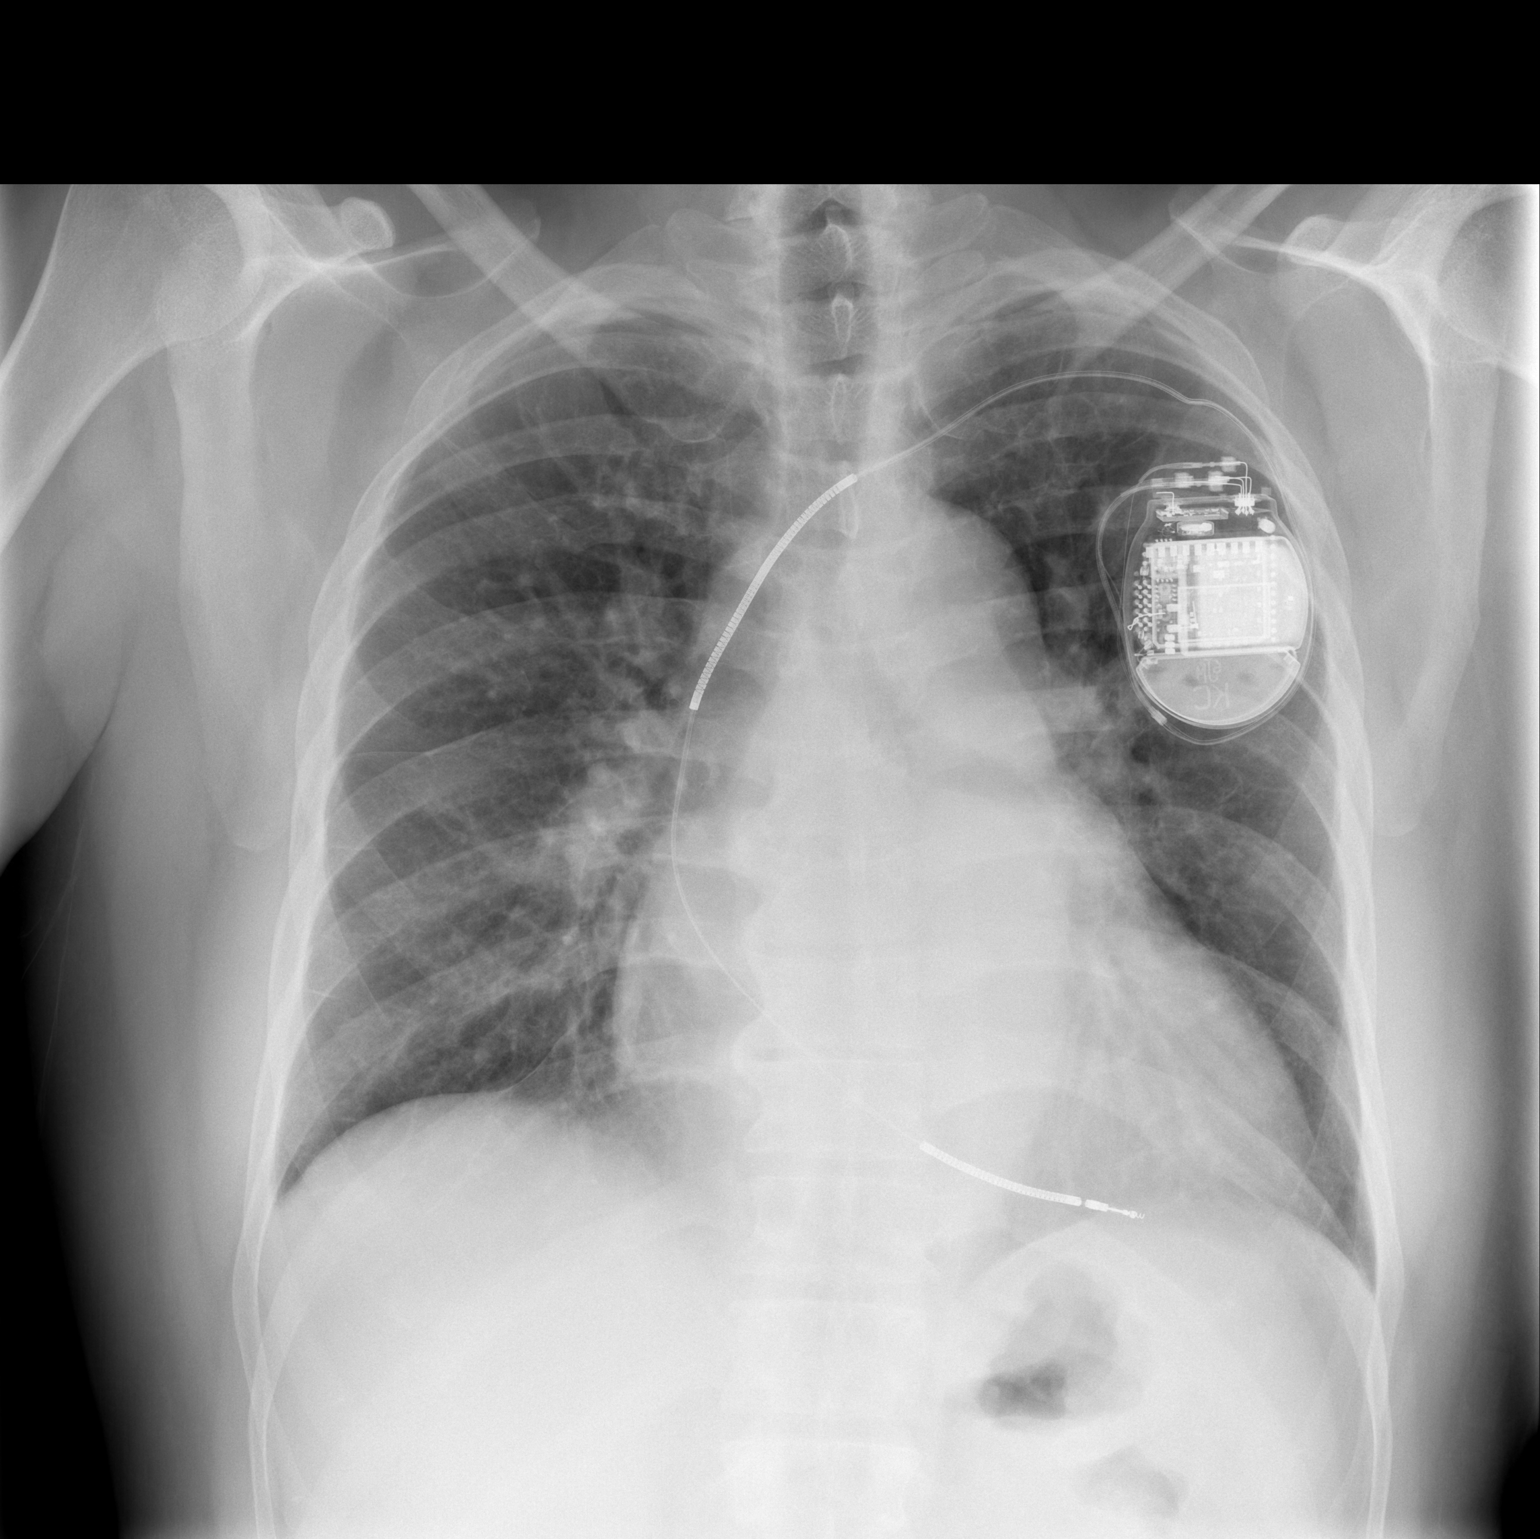

[w chest lat]
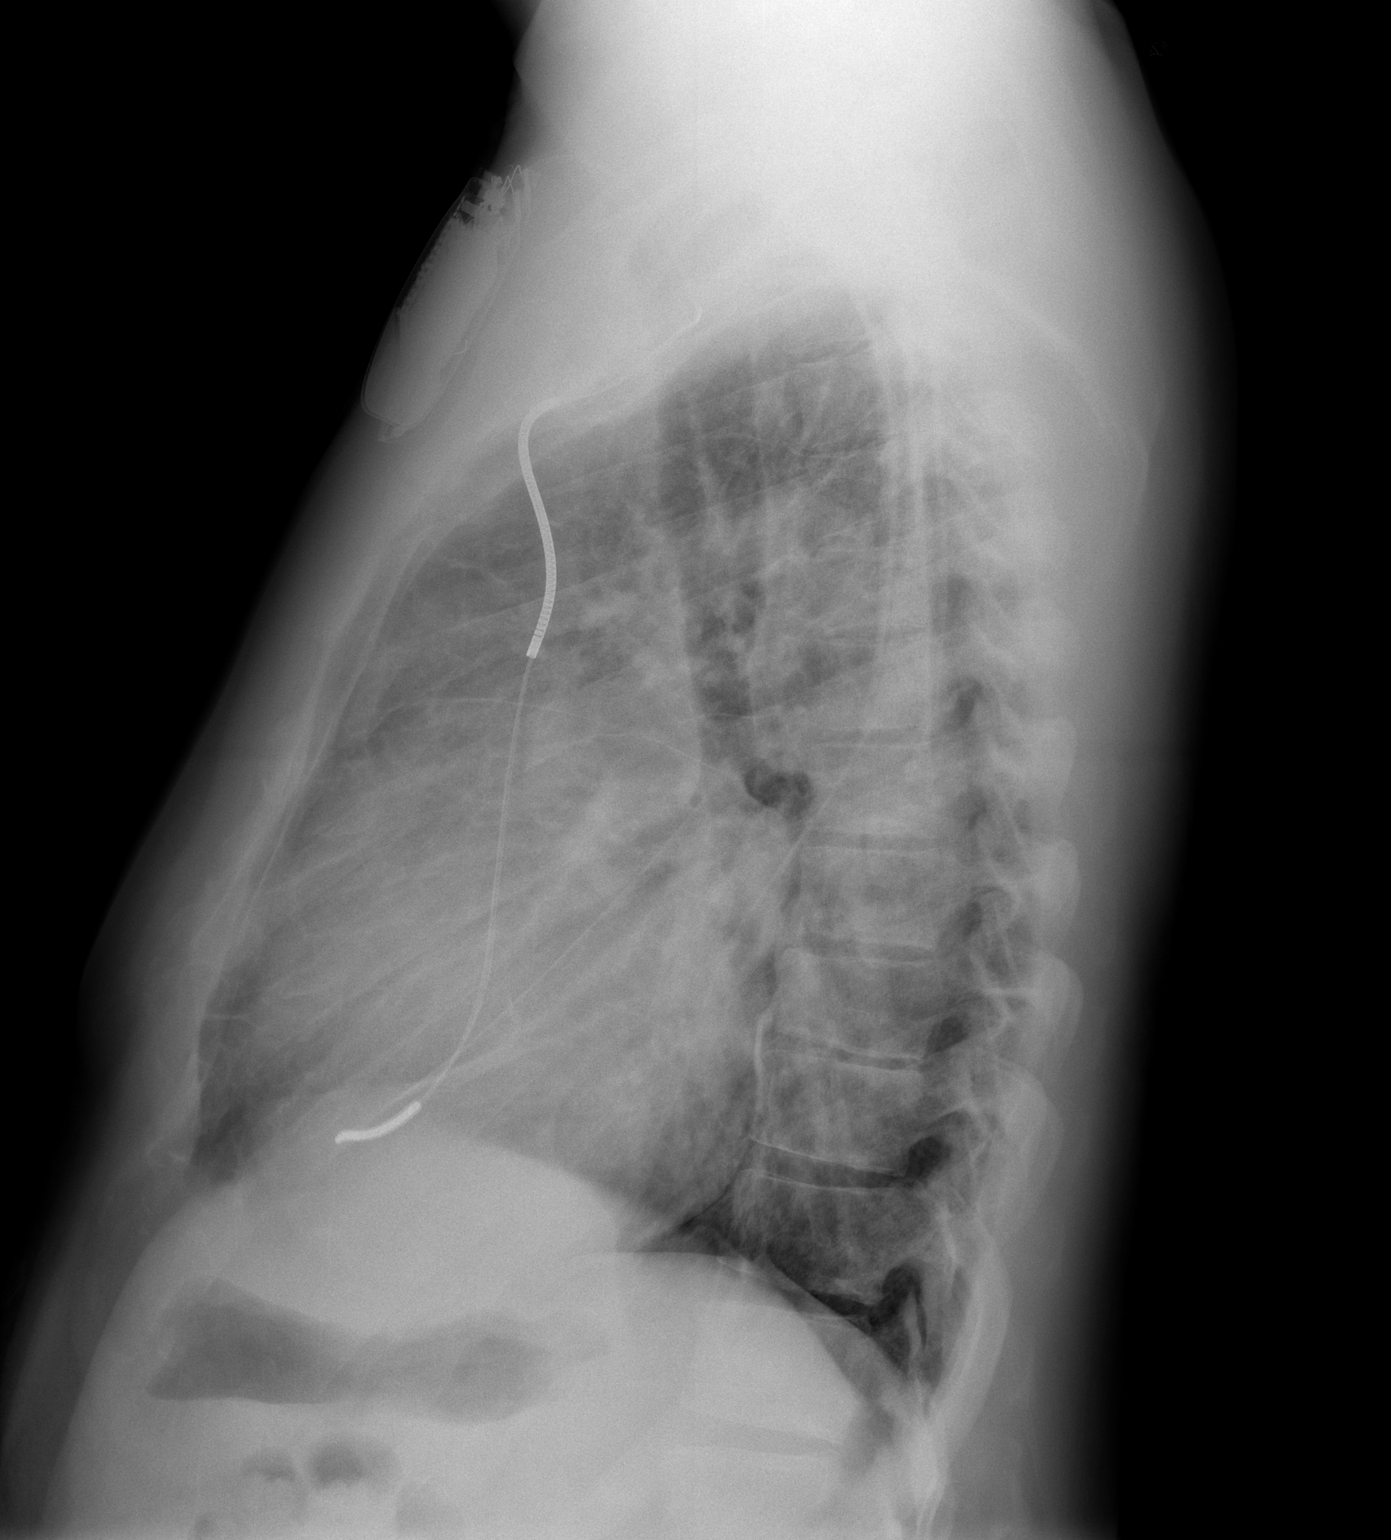

[2 of 2 positions shown; findings below may reference images not displayed]

FINDINGS: Left subclavian AICD is unchanged.  There is stable
cardiomegaly.  The mediastinal contours and pulmonary vascularity
are unchanged.  There is no pleural effusion.
IMPRESSION: Stable cardiomegaly and AICD position.  No acute cardiopulmonary
process.

## 2012-05-21 ENCOUNTER — Other Ambulatory Visit (HOSPITAL_COMMUNITY): Payer: Self-pay | Admitting: Internal Medicine

## 2012-05-21 ENCOUNTER — Telehealth (HOSPITAL_COMMUNITY): Payer: Self-pay | Admitting: *Deleted

## 2012-05-21 ENCOUNTER — Telehealth (HOSPITAL_COMMUNITY): Payer: Self-pay | Admitting: Physician Assistant

## 2012-05-21 ENCOUNTER — Encounter: Payer: Self-pay | Admitting: Internal Medicine

## 2012-05-21 ENCOUNTER — Ambulatory Visit (HOSPITAL_COMMUNITY)
Admission: RE | Admit: 2012-05-21 | Discharge: 2012-05-21 | Disposition: A | Discharge status: Home / Self Care | Payer: PRIVATE HEALTH INSURANCE | Source: Ambulatory Visit | Attending: Internal Medicine | Admitting: Internal Medicine

## 2012-05-21 DIAGNOSIS — R188 Other ascites: Secondary | ICD-10-CM

## 2012-05-21 DIAGNOSIS — N189 Chronic kidney disease, unspecified: Secondary | ICD-10-CM | POA: Insufficient documentation

## 2012-05-21 DIAGNOSIS — I509 Heart failure, unspecified: Secondary | ICD-10-CM | POA: Insufficient documentation

## 2012-05-21 MED ORDER — CARVEDILOL 3.125 MG PO TABS: 3.1250 mg | ORAL_TABLET | Freq: Two times a day (BID) | ORAL | Status: DC

## 2012-05-21 NOTE — Telephone Encounter (Signed)
Paracentesis sch for 1pm today pt aware

## 2012-05-21 NOTE — Telephone Encounter (Signed)
Lorin Picket, RN with [facility] called yesterday concerned about pt's abd being swollen and bloated, pt's wt is down to 215 lb but he states abd is large and distended.  Spoke w/pt last night and although his wt is down he does feel like his abd is full of fluid, discussed with Ulyess Blossom, PA and Dr Gala Romney, they would like pt to get paracentesis, pt is aware.

## 2012-05-21 NOTE — Telephone Encounter (Signed)
Not IM patient - deny

## 2012-05-21 NOTE — Progress Notes (Signed)
Patient ID: [name], male   DOB: 10-10-1950, 62 y.o.   MRN: [id number] Pt presented to [place] dept today for paracentesis. On limited US of abd(all four quadrants), there is no ascites present. Procedure was cancelled and pt informed of findings.

## 2012-05-21 NOTE — Telephone Encounter (Signed)
Refill carvedilol sent in.  Stop spironolactone due to renal failure.

## 2012-05-22 ENCOUNTER — Telehealth (HOSPITAL_COMMUNITY): Payer: Self-pay | Admitting: *Deleted

## 2012-05-24 IMAGING — CR DG CHEST 2V
2 series · 2 of 2 positions shown · non-contrast
Comparison: 12/07/2010

CLINICAL DATA: Short of breath.  Cough.  Congestive heart failure.

CHEST - 2 VIEW

[w chest pa]
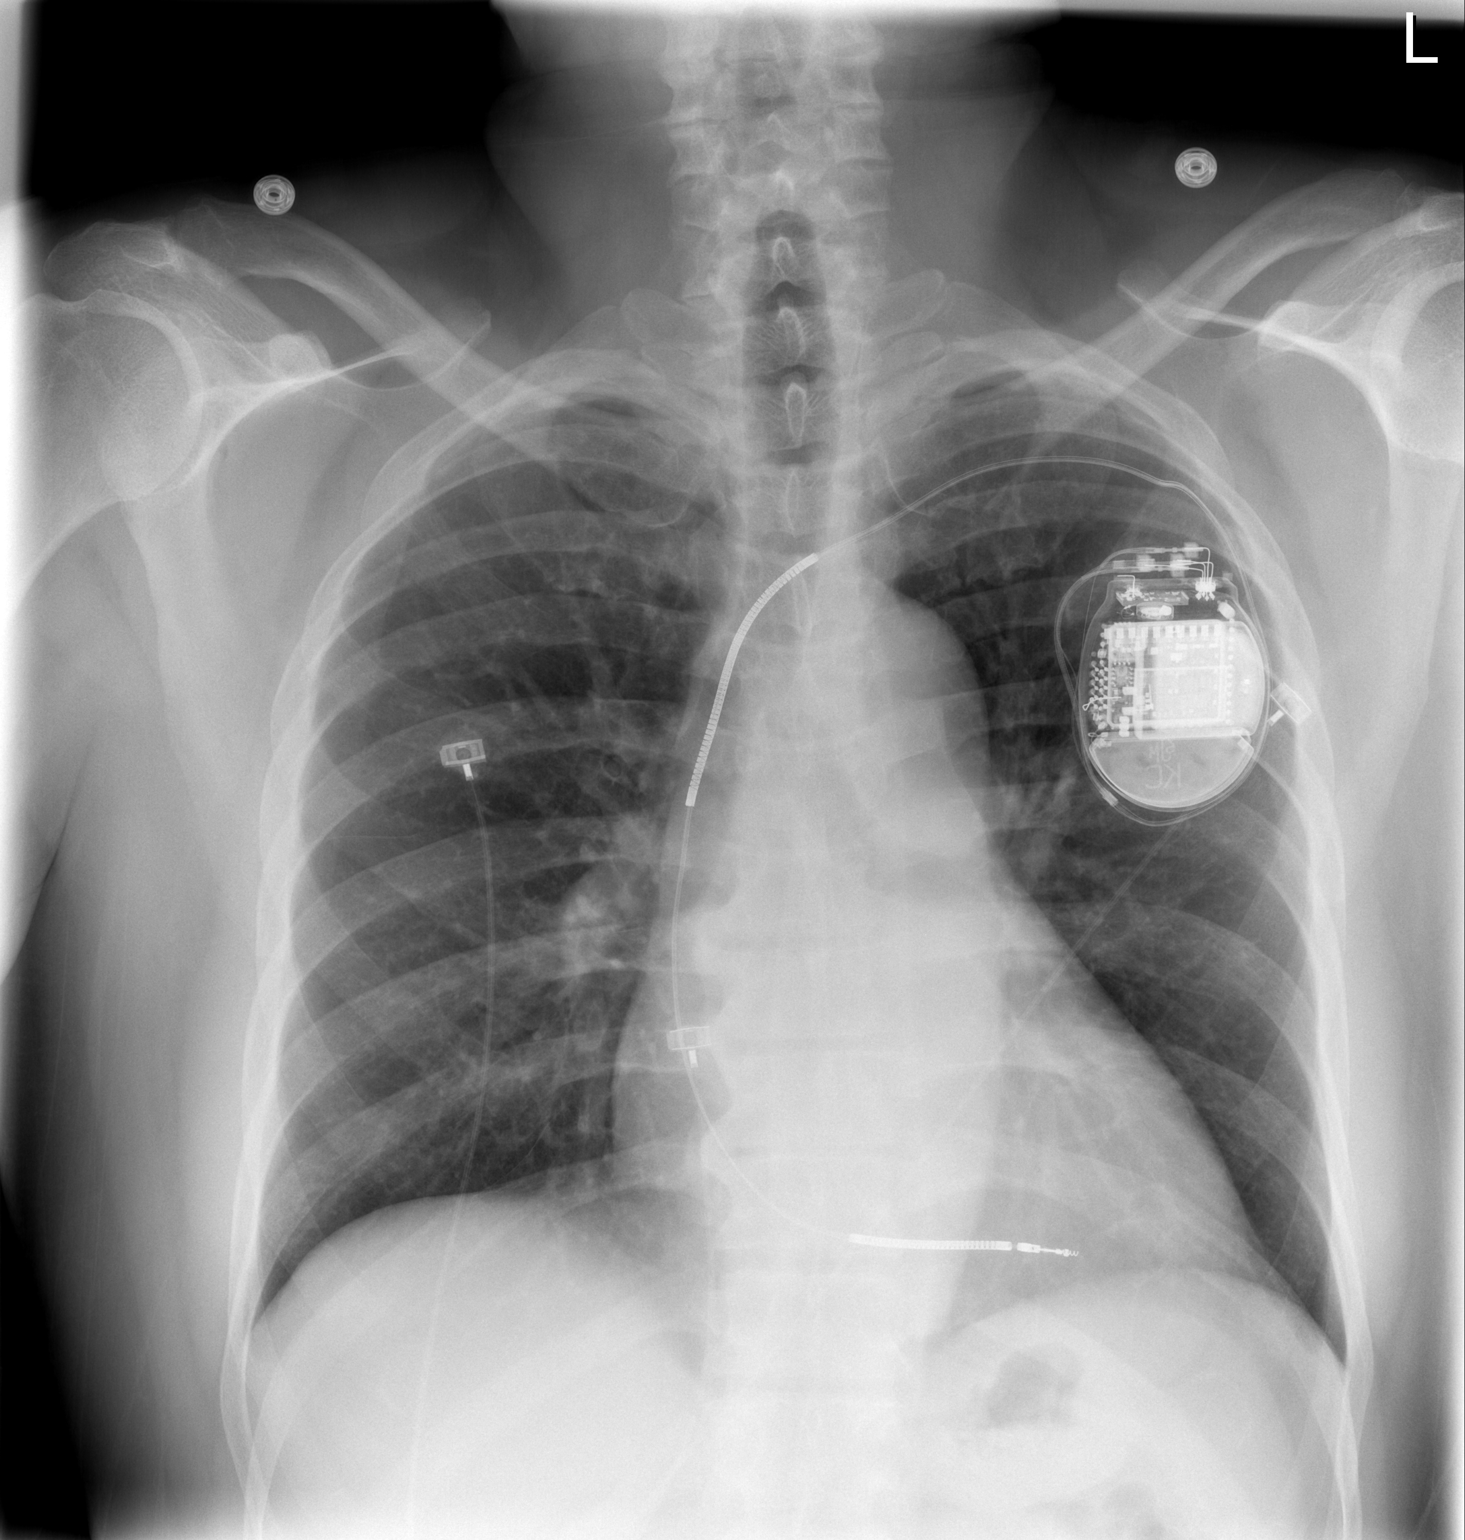

[w chest lat]
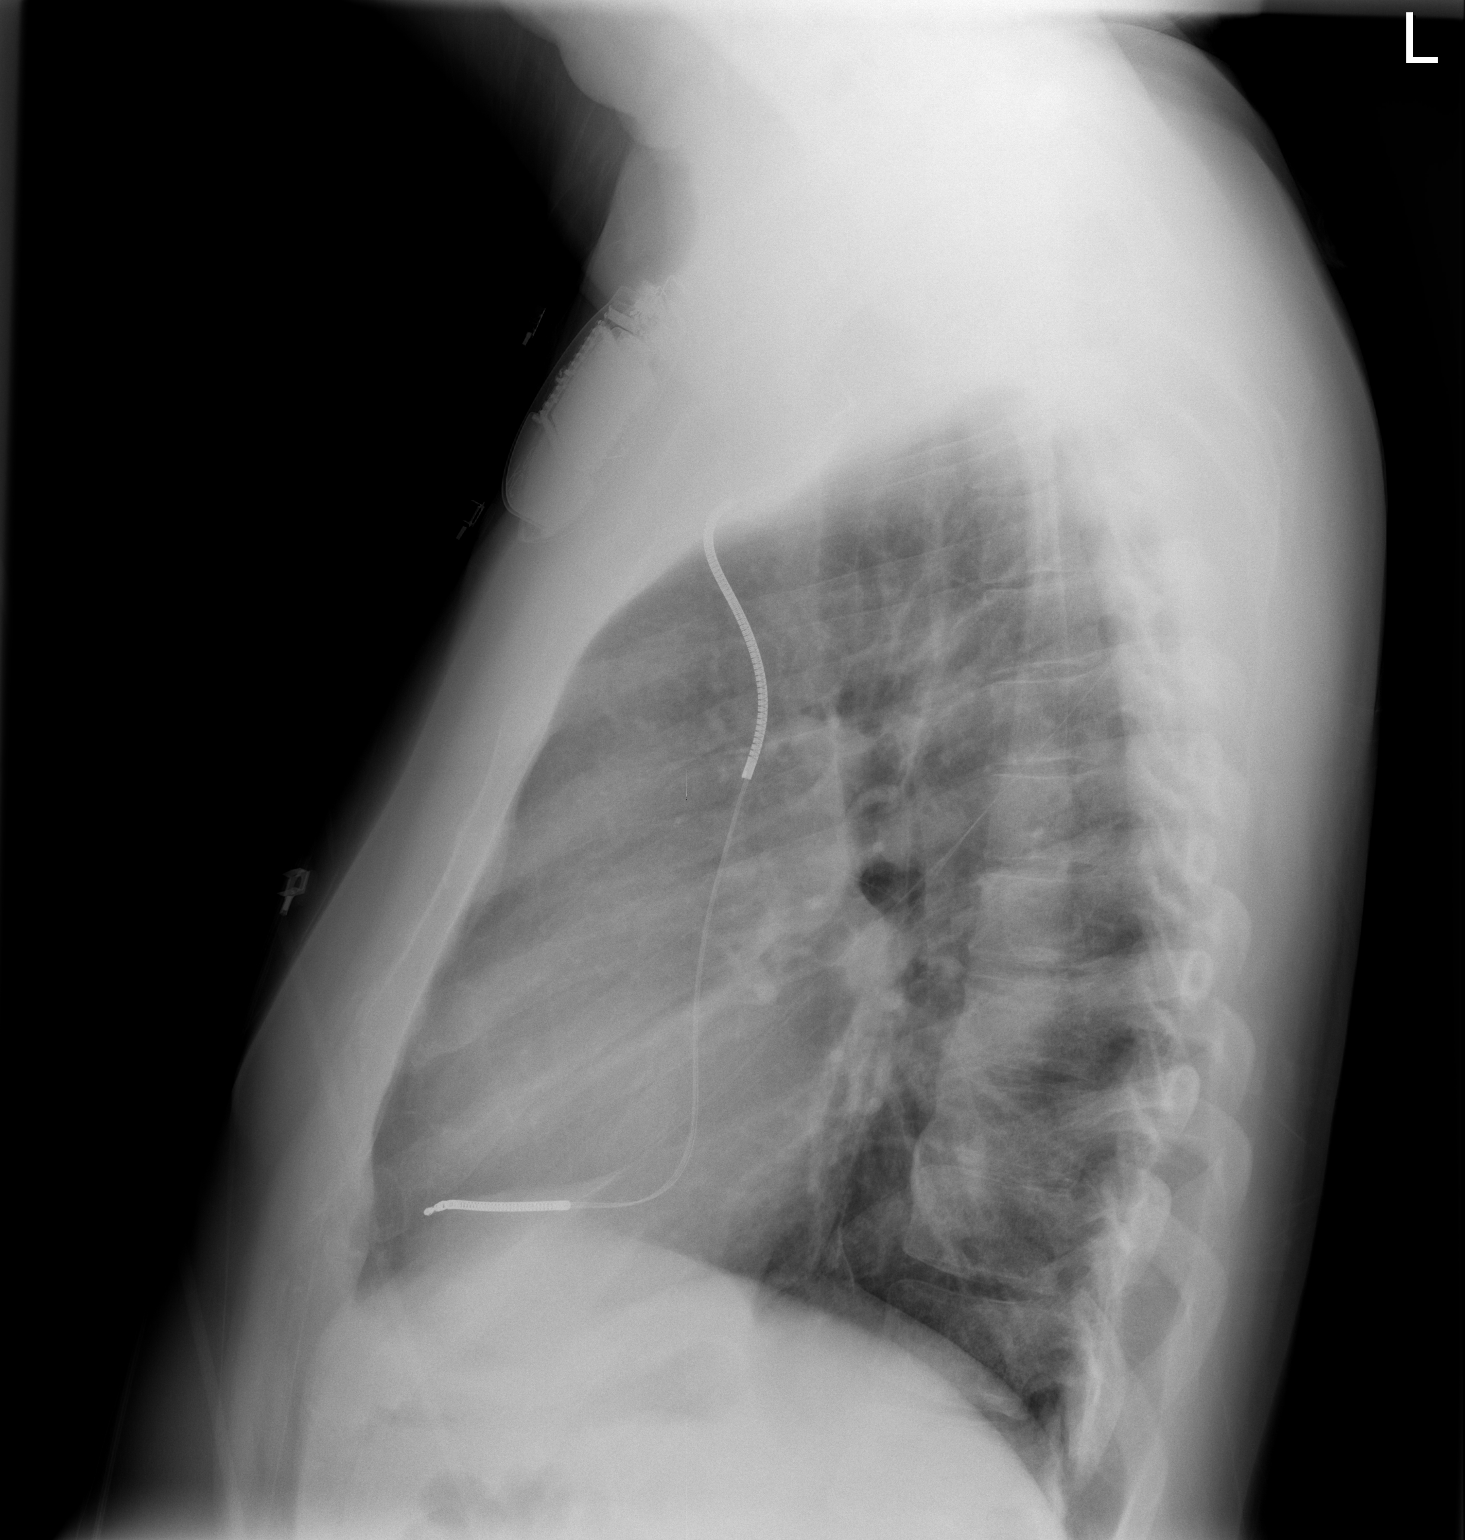

[2 of 2 positions shown; findings below may reference images not displayed]

FINDINGS: Mild cardiomegaly stable.  AICD remains in appropriate
position.  Both lungs remain clear.  No evidence of pleural
effusion.  No mass or lymphadenopathy identified.
IMPRESSION: Stable cardiomegaly.  No active lung disease.

## 2012-05-30 ENCOUNTER — Encounter: Payer: Self-pay | Admitting: Internal Medicine

## 2012-06-01 ENCOUNTER — Emergency Department (HOSPITAL_COMMUNITY): Payer: PRIVATE HEALTH INSURANCE

## 2012-06-01 ENCOUNTER — Encounter (HOSPITAL_COMMUNITY): Payer: Self-pay | Admitting: *Deleted

## 2012-06-01 ENCOUNTER — Emergency Department (HOSPITAL_COMMUNITY)
Admission: EM | Admit: 2012-06-01 | Discharge: 2012-06-01 | Disposition: A | Discharge status: Home / Self Care | Payer: PRIVATE HEALTH INSURANCE | Attending: Emergency Medicine | Admitting: Emergency Medicine

## 2012-06-01 DIAGNOSIS — I129 Hypertensive chronic kidney disease with stage 1 through stage 4 chronic kidney disease, or unspecified chronic kidney disease: Secondary | ICD-10-CM | POA: Insufficient documentation

## 2012-06-01 DIAGNOSIS — I252 Old myocardial infarction: Secondary | ICD-10-CM | POA: Insufficient documentation

## 2012-06-01 DIAGNOSIS — Y9289 Other specified places as the place of occurrence of the external cause: Secondary | ICD-10-CM | POA: Insufficient documentation

## 2012-06-01 DIAGNOSIS — M25521 Pain in right elbow: Secondary | ICD-10-CM

## 2012-06-01 DIAGNOSIS — N183 Chronic kidney disease, stage 3 unspecified: Secondary | ICD-10-CM | POA: Insufficient documentation

## 2012-06-01 DIAGNOSIS — Y9389 Activity, other specified: Secondary | ICD-10-CM | POA: Insufficient documentation

## 2012-06-01 DIAGNOSIS — Z8619 Personal history of other infectious and parasitic diseases: Secondary | ICD-10-CM | POA: Insufficient documentation

## 2012-06-01 DIAGNOSIS — Z9861 Coronary angioplasty status: Secondary | ICD-10-CM | POA: Insufficient documentation

## 2012-06-01 DIAGNOSIS — Z79899 Other long term (current) drug therapy: Secondary | ICD-10-CM | POA: Insufficient documentation

## 2012-06-01 DIAGNOSIS — I5023 Acute on chronic systolic (congestive) heart failure: Secondary | ICD-10-CM | POA: Insufficient documentation

## 2012-06-01 DIAGNOSIS — K219 Gastro-esophageal reflux disease without esophagitis: Secondary | ICD-10-CM | POA: Insufficient documentation

## 2012-06-01 DIAGNOSIS — Z8709 Personal history of other diseases of the respiratory system: Secondary | ICD-10-CM | POA: Insufficient documentation

## 2012-06-01 DIAGNOSIS — S59909A Unspecified injury of unspecified elbow, initial encounter: Secondary | ICD-10-CM | POA: Insufficient documentation

## 2012-06-01 DIAGNOSIS — I2510 Atherosclerotic heart disease of native coronary artery without angina pectoris: Secondary | ICD-10-CM | POA: Insufficient documentation

## 2012-06-01 DIAGNOSIS — Z95810 Presence of automatic (implantable) cardiac defibrillator: Secondary | ICD-10-CM | POA: Insufficient documentation

## 2012-06-01 DIAGNOSIS — E7800 Pure hypercholesterolemia, unspecified: Secondary | ICD-10-CM | POA: Insufficient documentation

## 2012-06-01 DIAGNOSIS — S6990XA Unspecified injury of unspecified wrist, hand and finger(s), initial encounter: Secondary | ICD-10-CM | POA: Insufficient documentation

## 2012-06-01 DIAGNOSIS — M109 Gout, unspecified: Secondary | ICD-10-CM | POA: Insufficient documentation

## 2012-06-01 DIAGNOSIS — Z8679 Personal history of other diseases of the circulatory system: Secondary | ICD-10-CM | POA: Insufficient documentation

## 2012-06-01 DIAGNOSIS — I209 Angina pectoris, unspecified: Secondary | ICD-10-CM | POA: Insufficient documentation

## 2012-06-01 DIAGNOSIS — R296 Repeated falls: Secondary | ICD-10-CM | POA: Insufficient documentation

## 2012-06-01 MED ORDER — OXYCODONE-ACETAMINOPHEN 5-325 MG PO TABS
1.0000 | ORAL_TABLET | Freq: Once | ORAL | Status: AC
  Administered 2012-06-01: 1 via ORAL
  Filled 2012-06-01: qty 1

## 2012-06-01 MED ORDER — OXYCODONE-ACETAMINOPHEN 5-325 MG PO TABS
1.0000 | ORAL_TABLET | Freq: Once | ORAL | Status: AC
  Administered 2012-06-01: 1 via ORAL
  Filled 2012-06-01 (×2): qty 1

## 2012-06-01 MED ORDER — OXYCODONE-ACETAMINOPHEN 5-325 MG PO TABS: 1.0000 | ORAL_TABLET | Freq: Four times a day (QID) | ORAL | Status: DC | PRN

## 2012-06-01 NOTE — ED Notes (Signed)
Ortho is aware that pt needs sling. Informed pt. Will continue to monitor.

## 2012-06-01 NOTE — ED Provider Notes (Signed)
History     CSN: [id number]  Arrival date & time 06/01/12  1314   First MD Initiated Contact with Patient 06/01/12 1329      Chief Complaint  Patient presents with  . Arm Pain    (Consider location/radiation/quality/duration/timing/severity/associated sxs/prior treatment) HPI Comments: Patient with past medical history of CHF, on milrinone drip -- presents with complaint of acute onset right elbow pain that began yesterday when he fell from standing onto a wooden floor landing directly on his right elbow. Patient has had pain since that time. He denies numbness, tingling. The pain is worse with movement or palpation. Patient states the elbow is swollen. He of note, he is on apixaban. No other injuries from fall. Patient did not hit his head. He denies neck pain. Course is constant.  Patient is a 62 y.o. male presenting with arm pain. The history is provided by the patient.  Arm Pain Associated symptoms include arthralgias and joint swelling. Pertinent negatives include no neck pain, numbness or weakness.    Past Medical History  Diagnosis Date  . CHF (congestive heart failure)   . Sarcoidosis   . Cardiomyopathy, dilated, nonischemic     non ischemic by cath  . Acute on chronic systolic heart failure   . Automatic implantable cardiac defibrillator in situ   . Atrial fibrillation   . NSVT (nonsustained ventricular tachycardia)   . GERD (gastroesophageal reflux disease)   . Hypercholesteremia   . Myocardial infarction   . Shortness of breath   . Chronic kidney disease (CKD), stage III (moderate)   . Pacemaker   . Anginal pain   . Gout   . Hypertension     [name]  . Coronary artery disease     Past Surgical History  Procedure Laterality Date  . Back surgery  1987    Ruptured disk repair  . Pacemaker insertion      with ICD  . Tee without cardioversion  01/17/2011    Procedure: TRANSESOPHAGEAL ECHOCARDIOGRAM (TEE);  Surgeon: Ricki Rodriguez, MD;  Location: [facility]  ENDOSCOPY;  Service: Cardiovascular;  Laterality: N/A;  . Cardioversion  01/17/2011    Procedure: CARDIOVERSION;  Surgeon: Ricki Rodriguez, MD;  Location: [facility] ENDOSCOPY;  Service: Cardiovascular;  Laterality: N/A;  . Cardiac catheterization    . Insert / replace / remove pacemaker    . Anterior cervical decomp/discectomy fusion  08/21/2011    Procedure: ANTERIOR CERVICAL DECOMPRESSION/DISCECTOMY FUSION 2 LEVELS;  Surgeon: Eldred Manges, MD;  Location: [facility] OR;  Service: Orthopedics;  Laterality: N/A;  C5-6, C6-7 Anterior Cervical Discectomy and Fusion, allograft, plate  . Tee without cardioversion N/A 02/16/2012    Procedure: TRANSESOPHAGEAL ECHOCARDIOGRAM (TEE);  Surgeon: Dolores Patty, MD;  Location: [facility] ENDOSCOPY;  Service: Cardiovascular;  Laterality: N/A;  original case scheduled under his dad (who is deceased), rescheduled under correct mrn/pt/dob. [place]/dl  . Tee without cardioversion N/A 03/22/2012    Procedure: TRANSESOPHAGEAL ECHOCARDIOGRAM (TEE);  Surgeon: Lewayne Bunting, MD;  Location: [facility] ENDOSCOPY;  Service: Cardiovascular;  Laterality: N/A;  . Cardioversion N/A 03/22/2012    Procedure: CARDIOVERSION;  Surgeon: Lewayne Bunting, MD;  Location: [facility] ENDOSCOPY;  Service: Cardiovascular;  Laterality: N/A;  . Cardioversion N/A 04/26/2012    Procedure: CARDIOVERSION;  Surgeon: Dolores Patty, MD;  Location: [facility] ENDOSCOPY;  Service: Cardiovascular;  Laterality: N/A;    Family History  Problem Relation Age of Onset  . Heart disease    . Heart failure    . Stroke    .  Anesthesia problems Neg Hx   . Hypotension Neg Hx   . Malignant hyperthermia Neg Hx   . Pseudochol deficiency Neg Hx     History  Substance Use Topics  . Smoking status: Never Smoker   . Smokeless tobacco: Never Used  . Alcohol Use: No      Review of Systems  Constitutional: Positive for activity change.  HENT: Negative for neck pain.   Musculoskeletal: Positive for joint swelling and arthralgias. Negative for back  pain and gait problem.  Skin: Negative for wound.  Neurological: Negative for weakness and numbness.    Allergies  Review of patient's allergies indicates no known allergies.  Home Medications   Current Outpatient Rx  Name  Route  Sig  Dispense  Refill  . allopurinol (ZYLOPRIM) 100 MG tablet   Oral   Take 1 tablet (100 mg total) by mouth daily.   30 tablet   6   . amiodarone (PACERONE) 200 MG tablet   Oral   Take 1 tablet (200 mg total) by mouth 2 (two) times daily.   60 tablet   6   . amoxicillin (AMOXIL) 500 MG capsule   Oral   Take 2 capsules (1,000 mg total) by mouth 2 (two) times daily.   40 capsule   0   . apixaban (ELIQUIS) 5 MG TABS tablet   Oral   Take 1 tablet (5 mg total) by mouth 2 (two) times daily.   60 tablet   3   . carvedilol (COREG) 3.125 MG tablet   Oral   Take 1 tablet (3.125 mg total) by mouth 2 (two) times daily with a meal.   60 tablet   2   . isosorbide-hydrALAZINE (BIDIL) 20-37.5 MG per tablet   Oral   Take 0.5 tablets by mouth 3 (three) times daily.   45 tablet   1   . LORazepam (ATIVAN) 1 MG tablet   Oral   Take 0.5 tablets (0.5 mg total) by mouth 2 (two) times daily as needed for anxiety.   15 tablet   0   . magnesium oxide (MAG-OX 400) 400 MG tablet   Oral   Take 1 tablet (400 mg total) by mouth daily.   90 tablet   3   . metolazone (ZAROXOLYN) 2.5 MG tablet   Oral   Take 1 tablet (2.5 mg total) by mouth as directed.   5 tablet   1   . milrinone (PRIMACOR) 20 MG/100ML SOLN infusion   Intravenous   Inject 37.3125 mcg/min into the vein continuous.   100 mL      . ondansetron (ZOFRAN-ODT) 8 MG disintegrating tablet   Oral   Take 8 mg by mouth every 8 (eight) hours as needed for nausea. 8mg  ODT q4 hours prn nausea         . oxyCODONE-acetaminophen (PERCOCET/ROXICET) 5-325 MG per tablet   Oral   Take 1 tablet by mouth every 4 (four) hours as needed for pain.   20 tablet   0   . pantoprazole (PROTONIX) 40 MG  tablet   Oral   Take 40 mg by mouth daily.         . polyethylene glycol powder (GLYCOLAX/MIRALAX) powder   Oral   Take 17 g by mouth 2 (two) times daily. Until daily soft stools  OTC   255 g   0   . predniSONE (DELTASONE) 10 MG tablet   Oral   Take 2 tablets (20 mg total) by  mouth daily.   15 tablet   0   . rosuvastatin (CRESTOR) 5 MG tablet   Oral   Take 1 tablet (5 mg total) by mouth at bedtime.         . torsemide (DEMADEX) 20 MG tablet      2 tabs in AM and 1 tab in PM   90 tablet   6     BP 102/76  Pulse 100  Temp(Src) 97.6 F (36.4 C) (Oral)  Resp 18  SpO2 96%  Physical Exam  Nursing note and vitals reviewed. Constitutional: He is oriented to person, place, and time. He appears well-developed and well-nourished.  HENT:  Head: Normocephalic and atraumatic.  Right Ear: Tympanic membrane, external ear and ear canal normal.  Left Ear: Tympanic membrane, external ear and ear canal normal.  Nose: Nose normal.  Mouth/Throat: Uvula is midline, oropharynx is clear and moist and mucous membranes are normal.  Eyes: Conjunctivae, EOM and lids are normal. Pupils are equal, round, and reactive to light.  Neck: Normal range of motion. Neck supple.  Cardiovascular: Normal rate, regular rhythm and normal pulses.   Pulses:      Radial pulses are 2+ on the right side, and 2+ on the left side.  Pulmonary/Chest: Effort normal and breath sounds normal.  Abdominal: Soft. There is no tenderness.  Musculoskeletal: He exhibits tenderness. He exhibits no edema.       Right shoulder: Normal.       Right elbow: He exhibits decreased range of motion, swelling and effusion. Tenderness found. Olecranon process tenderness noted. No radial head, no medial epicondyle and no lateral epicondyle tenderness noted.       Right wrist: Normal.       Cervical back: He exhibits normal range of motion, no tenderness and no bony tenderness.       Right upper arm: He exhibits no tenderness.        Right forearm: Normal.       Arms:      Right hand: Normal. Normal sensation noted. Normal strength noted.  Neurological: He is alert and oriented to person, place, and time. He has normal strength and normal reflexes. No cranial nerve deficit or sensory deficit. He exhibits normal muscle tone. He displays a negative Romberg sign. Coordination and gait normal. GCS eye subscore is 4. GCS verbal subscore is 5. GCS motor subscore is 6.  Motor, sensation, and vascular distal to the injury is fully intact.   Skin: Skin is warm and dry.  Psychiatric: He has a normal mood and affect.    ED Course  Procedures (including critical care time)  Labs Reviewed - No data to display Dg Elbow Complete Right  06/01/2012   *RADIOLOGY REPORT*  Clinical Data: Fall, right elbow pain  RIGHT ELBOW - COMPLETE 3+ VIEW  Comparison: None.  Findings: No fracture or dislocation is seen.  Mild degenerative changes.  No displaced elbow joint fat pads to suggest an elbow joint effusion.  IMPRESSION: No fracture or dislocation is seen.  Mild degenerative changes.   Original Report Authenticated By: Charline Bills, M.D.     1. Elbow pain, right     1:41 PM Patient seen and examined. Work-up initiated. Medications ordered.   Vital signs reviewed and are as follows: Filed Vitals:   06/01/12 1322  BP: 102/76  Pulse: 100  Temp: 97.6 F (36.4 C)  Resp: 18   X-ray reviewed by myself.  Patient informed of negative x-ray results. Splint by  orthopedic technician.  Patient counseled on use of narcotic pain medications. Counseled not to combine these medications with others containing tylenol. Urged not to drink alcohol, drive, or perform any other activities that requires focus while taking these medications. The patient verbalizes understanding and agrees with the plan.  Patient has orthopedic follow-up. Urged orthopedic visit if no improvement in the next several days.   Patient verbalizes understanding and agrees  with plan.    MDM  Patient with right elbow pain after fall. X-rays negative for fracture. I suspect that the patient probably has an effusion, potentially hemarthrosis 2/2 anticoagulation. This would explain pain. There is no evidence of compartment syndrome. Patient has normal pulses and sensation distal to the entry. Conservative management, RICE protocol, pain medication indicated.        Renne Crigler, PA-C 06/01/12 1743

## 2012-06-01 NOTE — ED Notes (Signed)
Pt stated that he fell on his right elbow yesterday. He woke up with right elbow pain and minimal movement. CNS intact. Minimal swelling noted. Will continue to monitor.

## 2012-06-01 NOTE — Progress Notes (Signed)
Orthopedic Tech Progress Note Patient Details:  [name] [birth year] [id number]  Ortho Devices Type of Ortho Device: Arm sling Ortho Device/Splint Interventions: Application   [name], Darrell Bail 06/01/2012, 2:13 PM

## 2012-06-01 NOTE — ED Notes (Signed)
Pt states that he fell yesterday on  Right elbow and today has pain with minimal movement.  Pt has milrinone gtt going into right upper arm picc line. Pulse present

## 2012-06-02 NOTE — ED Provider Notes (Signed)
Medical screening examination/treatment/procedure(s) were performed by non-physician practitioner and as supervising physician I was immediately available for consultation/collaboration.   Bee Marchiano Y. Roselyn Doby, MD 06/02/12 1057 

## 2012-06-04 ENCOUNTER — Encounter: Payer: Self-pay | Admitting: Pharmacist

## 2012-06-04 ENCOUNTER — Telehealth (HOSPITAL_COMMUNITY): Payer: Self-pay | Admitting: Adult Health

## 2012-06-04 NOTE — Progress Notes (Signed)
This encounter was created in error - please disregard.

## 2012-06-04 NOTE — Telephone Encounter (Signed)
Spoke with his mother. Mr [name] is out of town for appointment at  [facility] regarding VAD.   I have asked that she tell him to call HF clinic regarding lab work.  CLEGG,AMY 3:48 PM

## 2012-06-06 ENCOUNTER — Telehealth (HOSPITAL_COMMUNITY): Payer: Self-pay | Admitting: *Deleted

## 2012-06-06 NOTE — Telephone Encounter (Signed)
Received labs from [facility] drawn 6/2 K 2.8, bun 86 cr 3.26, per Tonye Becket, NP hold Torsemide for 2 days take extra 40 meq of KCL, have been trying to reach pt since 6/3, finally spoke w/him this AM he states wt was up but is back down now, he will hold Torsemide today and tomorrow and take extra KCL [facility] will recheck 6/9

## 2012-06-07 ENCOUNTER — Encounter: Payer: Self-pay | Admitting: Cardiology

## 2012-06-07 NOTE — Progress Notes (Signed)
This encounter was created in error - please disregard.

## 2012-06-10 ENCOUNTER — Encounter (HOSPITAL_COMMUNITY): Payer: Self-pay

## 2012-06-10 ENCOUNTER — Emergency Department (HOSPITAL_COMMUNITY)
Admission: EM | Admit: 2012-06-10 | Discharge: 2012-06-10 | Disposition: A | Discharge status: Home / Self Care | Payer: PRIVATE HEALTH INSURANCE | Attending: Emergency Medicine | Admitting: Emergency Medicine

## 2012-06-10 ENCOUNTER — Ambulatory Visit (HOSPITAL_COMMUNITY)
Admission: RE | Admit: 2012-06-10 | Discharge: 2012-06-10 | Disposition: A | Discharge status: Home / Self Care | Payer: PRIVATE HEALTH INSURANCE | Source: Ambulatory Visit | Attending: Internal Medicine | Admitting: Internal Medicine

## 2012-06-10 ENCOUNTER — Encounter (HOSPITAL_COMMUNITY): Payer: Self-pay | Admitting: Emergency Medicine

## 2012-06-10 VITALS — BP 112/82 | HR 94 | Wt 237.0 lb

## 2012-06-10 DIAGNOSIS — Z79899 Other long term (current) drug therapy: Secondary | ICD-10-CM | POA: Insufficient documentation

## 2012-06-10 DIAGNOSIS — I5023 Acute on chronic systolic (congestive) heart failure: Secondary | ICD-10-CM | POA: Insufficient documentation

## 2012-06-10 DIAGNOSIS — N183 Chronic kidney disease, stage 3 unspecified: Secondary | ICD-10-CM | POA: Insufficient documentation

## 2012-06-10 DIAGNOSIS — Z8619 Personal history of other infectious and parasitic diseases: Secondary | ICD-10-CM | POA: Insufficient documentation

## 2012-06-10 DIAGNOSIS — I209 Angina pectoris, unspecified: Secondary | ICD-10-CM | POA: Insufficient documentation

## 2012-06-10 DIAGNOSIS — N182 Chronic kidney disease, stage 2 (mild): Secondary | ICD-10-CM | POA: Insufficient documentation

## 2012-06-10 DIAGNOSIS — I509 Heart failure, unspecified: Secondary | ICD-10-CM | POA: Insufficient documentation

## 2012-06-10 DIAGNOSIS — I2510 Atherosclerotic heart disease of native coronary artery without angina pectoris: Secondary | ICD-10-CM | POA: Insufficient documentation

## 2012-06-10 DIAGNOSIS — Z95810 Presence of automatic (implantable) cardiac defibrillator: Secondary | ICD-10-CM | POA: Insufficient documentation

## 2012-06-10 DIAGNOSIS — I129 Hypertensive chronic kidney disease with stage 1 through stage 4 chronic kidney disease, or unspecified chronic kidney disease: Secondary | ICD-10-CM | POA: Insufficient documentation

## 2012-06-10 DIAGNOSIS — Z8679 Personal history of other diseases of the circulatory system: Secondary | ICD-10-CM | POA: Insufficient documentation

## 2012-06-10 DIAGNOSIS — M25529 Pain in unspecified elbow: Secondary | ICD-10-CM | POA: Insufficient documentation

## 2012-06-10 DIAGNOSIS — M25521 Pain in right elbow: Secondary | ICD-10-CM

## 2012-06-10 DIAGNOSIS — E7800 Pure hypercholesterolemia, unspecified: Secondary | ICD-10-CM | POA: Insufficient documentation

## 2012-06-10 DIAGNOSIS — I252 Old myocardial infarction: Secondary | ICD-10-CM | POA: Insufficient documentation

## 2012-06-10 DIAGNOSIS — K219 Gastro-esophageal reflux disease without esophagitis: Secondary | ICD-10-CM | POA: Insufficient documentation

## 2012-06-10 DIAGNOSIS — F1420 Cocaine dependence, uncomplicated: Secondary | ICD-10-CM | POA: Insufficient documentation

## 2012-06-10 DIAGNOSIS — I4891 Unspecified atrial fibrillation: Secondary | ICD-10-CM | POA: Insufficient documentation

## 2012-06-10 DIAGNOSIS — M2550 Pain in unspecified joint: Secondary | ICD-10-CM | POA: Insufficient documentation

## 2012-06-10 MED ORDER — HYDRALAZINE HCL 25 MG PO TABS
37.5000 mg | ORAL_TABLET | Freq: Three times a day (TID) | ORAL | Status: DC
Start: 2012-06-10 — End: 2012-07-15

## 2012-06-10 MED ORDER — METOLAZONE 2.5 MG PO TABS: 2.5000 mg | ORAL_TABLET | ORAL | Status: DC

## 2012-06-10 MED ORDER — OXYCODONE-ACETAMINOPHEN 5-325 MG PO TABS: ORAL_TABLET | ORAL | Status: DC

## 2012-06-10 MED ORDER — OXYCODONE-ACETAMINOPHEN 5-325 MG PO TABS
2.0000 | ORAL_TABLET | Freq: Once | ORAL | Status: AC
  Administered 2012-06-10: 2 via ORAL
  Filled 2012-06-10: qty 2

## 2012-06-10 MED ORDER — SILDENAFIL CITRATE 100 MG PO TABS: 100.0000 mg | ORAL_TABLET | Freq: Every day | ORAL | Status: DC | PRN

## 2012-06-10 MED ORDER — POTASSIUM CHLORIDE CRYS ER 20 MEQ PO TBCR: 20.0000 meq | EXTENDED_RELEASE_TABLET | Freq: Two times a day (BID) | ORAL | Status: DC

## 2012-06-10 NOTE — ED Provider Notes (Signed)
History     CSN: [id number]  Arrival date & time 06/10/12  1024   First MD Initiated Contact with Patient 06/10/12 1045      Chief Complaint  Patient presents with  . Extremity Pain    (Consider location/radiation/quality/duration/timing/severity/associated sxs/prior treatment) HPI Comments: 62 y.o. Male with PMHx of a-fib (anti-coagulated with Eliquis), CKD stage 3, CAD, HTN, CHF with milrinone drip, MI, pacemaker, and narcotic abuse presents today complaining of right elbow pain that has not subsided since his fall on 06/01/12. Pt was seen here on that day (no fx, no fat pad, immobilized w sling and ortho follow up), but was unable to get an ortho f-up until July 25. Pt states he is here today for pain management. Pt states the pain is unchanged, worse with movement, better with narcotic pain meds. . Pt states he has not used the sling since he got home, but does still have it.   Patient is a 62 y.o. male presenting with extremity pain.  Extremity Pain Associated symptoms include arthralgias. Pertinent negatives include no chest pain, chills, diaphoresis, fever, headaches, nausea, neck pain, numbness, rash, vomiting or weakness. The symptoms are aggravated by bending. He has tried oral narcotics for the symptoms. The treatment provided moderate relief.    Past Medical History  Diagnosis Date  . CHF (congestive heart failure)   . Sarcoidosis   . Cardiomyopathy, dilated, nonischemic     non ischemic by cath  . Acute on chronic systolic heart failure   . Automatic implantable cardiac defibrillator in situ   . Atrial fibrillation   . NSVT (nonsustained ventricular tachycardia)   . GERD (gastroesophageal reflux disease)   . Hypercholesteremia   . Myocardial infarction   . Shortness of breath   . Chronic kidney disease (CKD), stage III (moderate)   . Pacemaker   . Anginal pain   . Gout   . Hypertension     [name]  . Coronary artery disease     Past Surgical History   Procedure Laterality Date  . Back surgery  1987    Ruptured disk repair  . Pacemaker insertion      with ICD  . Tee without cardioversion  01/17/2011    Procedure: TRANSESOPHAGEAL ECHOCARDIOGRAM (TEE);  Surgeon: Ricki Rodriguez, MD;  Location: [facility] ENDOSCOPY;  Service: Cardiovascular;  Laterality: N/A;  . Cardioversion  01/17/2011    Procedure: CARDIOVERSION;  Surgeon: Ricki Rodriguez, MD;  Location: [facility] ENDOSCOPY;  Service: Cardiovascular;  Laterality: N/A;  . Cardiac catheterization    . Insert / replace / remove pacemaker    . Anterior cervical decomp/discectomy fusion  08/21/2011    Procedure: ANTERIOR CERVICAL DECOMPRESSION/DISCECTOMY FUSION 2 LEVELS;  Surgeon: Eldred Manges, MD;  Location: [facility] OR;  Service: Orthopedics;  Laterality: N/A;  C5-6, C6-7 Anterior Cervical Discectomy and Fusion, allograft, plate  . Tee without cardioversion N/A 02/16/2012    Procedure: TRANSESOPHAGEAL ECHOCARDIOGRAM (TEE);  Surgeon: Dolores Patty, MD;  Location: [facility] ENDOSCOPY;  Service: Cardiovascular;  Laterality: N/A;  original case scheduled under his dad (who is deceased), rescheduled under correct mrn/pt/dob. [place]/dl  . Tee without cardioversion N/A 03/22/2012    Procedure: TRANSESOPHAGEAL ECHOCARDIOGRAM (TEE);  Surgeon: Lewayne Bunting, MD;  Location: [facility] ENDOSCOPY;  Service: Cardiovascular;  Laterality: N/A;  . Cardioversion N/A 03/22/2012    Procedure: CARDIOVERSION;  Surgeon: Lewayne Bunting, MD;  Location: [facility] ENDOSCOPY;  Service: Cardiovascular;  Laterality: N/A;  . Cardioversion N/A 04/26/2012    Procedure: CARDIOVERSION;  Surgeon: Dolores Patty, MD;  Location: [facility] ENDOSCOPY;  Service: Cardiovascular;  Laterality: N/A;    Family History  Problem Relation Age of Onset  . Heart disease    . Heart failure    . Stroke    . Anesthesia problems Neg Hx   . Hypotension Neg Hx   . Malignant hyperthermia Neg Hx   . Pseudochol deficiency Neg Hx     History  Substance Use Topics  . Smoking status: Never  Smoker   . Smokeless tobacco: Never Used  . Alcohol Use: No      Review of Systems  Constitutional: Negative for fever, chills and diaphoresis.  HENT: Negative for neck pain and neck stiffness.   Eyes: Negative for visual disturbance.  Respiratory: Negative for apnea, chest tightness and shortness of breath.   Cardiovascular: Negative for chest pain and palpitations.  Gastrointestinal: Negative for nausea, vomiting, diarrhea and constipation.  Genitourinary: Negative for dysuria.  Musculoskeletal: Positive for arthralgias. Negative for gait problem.       Right elbow  Skin: Negative for rash.  Neurological: Negative for dizziness, weakness, light-headedness, numbness and headaches.    Allergies  Review of patient's allergies indicates no known allergies.  Home Medications   Current Outpatient Rx  Name  Route  Sig  Dispense  Refill  . allopurinol (ZYLOPRIM) 100 MG tablet   Oral   Take 1 tablet (100 mg total) by mouth daily.   30 tablet   6   . amiodarone (PACERONE) 200 MG tablet   Oral   Take 1 tablet (200 mg total) by mouth 2 (two) times daily.   60 tablet   6   . apixaban (ELIQUIS) 5 MG TABS tablet   Oral   Take 5 mg by mouth 3 (three) times daily.         . carvedilol (COREG) 3.125 MG tablet   Oral   Take 1 tablet (3.125 mg total) by mouth 2 (two) times daily with a meal.   60 tablet   2   . esomeprazole (NEXIUM) 40 MG capsule   Oral   Take 40 mg by mouth daily as needed (for acid reflux).          . LORazepam (ATIVAN) 1 MG tablet   Oral   Take 0.5 tablets (0.5 mg total) by mouth 2 (two) times daily as needed for anxiety.   15 tablet   0   . metolazone (ZAROXOLYN) 2.5 MG tablet   Oral   Take 1 tablet (2.5 mg total) by mouth See admin instructions. Patient takes as directed by MD for fluid retention   8 tablet   3   . milrinone (PRIMACOR) 20 MG/100ML SOLN infusion   Intravenous   Inject 37.3125 mcg/min into the vein continuous.   100 mL       . ondansetron (ZOFRAN-ODT) 8 MG disintegrating tablet   Oral   Take 8 mg by mouth every 8 (eight) hours as needed for nausea. 8mg  ODT q4 hours prn nausea         . potassium chloride SA (K-DUR,KLOR-CON) 20 MEQ tablet   Oral   Take 1 tablet (20 mEq total) by mouth 2 (two) times daily.   60 tablet   3   . rosuvastatin (CRESTOR) 5 MG tablet   Oral   Take 1 tablet (5 mg total) by mouth at bedtime.         . torsemide (DEMADEX) 20 MG tablet   Oral  Take 20 mg by mouth 2 (two) times daily.          Marland Kitchen zolpidem (AMBIEN) 5 MG tablet   Oral   Take 5 mg by mouth at bedtime as needed for sleep.         . hydrALAZINE (APRESOLINE) 25 MG tablet   Oral   Take 1.5 tablets (37.5 mg total) by mouth 3 (three) times daily.   135 tablet   3   . sildenafil (VIAGRA) 100 MG tablet   Oral   Take 1 tablet (100 mg total) by mouth daily as needed for erectile dysfunction.   4 tablet   1     BP 122/80  Pulse 94  Temp(Src) 98 F (36.7 C) (Oral)  Resp 18  SpO2 97%  Physical Exam  Nursing note and vitals reviewed. Constitutional: He is oriented to person, place, and time. He appears well-developed and well-nourished. No distress.  HENT:  Head: Normocephalic and atraumatic.  Eyes: Conjunctivae and EOM are normal. Pupils are equal, round, and reactive to light.  Neck: Normal range of motion. Neck supple.  No meningeal signs  Cardiovascular: Normal rate, regular rhythm, normal heart sounds and intact distal pulses.  Exam reveals no gallop and no friction rub.   No murmur heard. Pulmonary/Chest: Effort normal and breath sounds normal. No respiratory distress. He has no wheezes. He has no rales. He exhibits no tenderness.  Abdominal: Soft. Bowel sounds are normal. He exhibits no distension. There is no tenderness. There is no rebound and no guarding.  Musculoskeletal: Normal range of motion. He exhibits edema and tenderness.  4/5 strength of injured right elbow. Tender to palpation. Mild  swelling. No erythema. No warmth. No effusion. Intact distal pulses. No evidence of compartment syndrome.   Neurological: He is alert and oriented to person, place, and time. No cranial nerve deficit.  Speech is clear and goal oriented, follows commands Sensation normal to light touch and two point discrimination Moves extremities without ataxia, coordination intact Normal gait and balance Decreased strength in right upper extremity secondary to pain  Skin: Skin is warm and dry. He is not diaphoretic. No erythema.  PICC line in place, right arm proximal to the antecubital fossa  Psychiatric: He has a normal mood and affect.    ED Course  Procedures (including critical care time)  Labs Reviewed - No data to display No results found.   1. Right elbow pain       MDM  Discussed pt case with Dr. Rosalia Hammers. No evidence of compartment syndrome. Motor, sensation, and vascular distal to the injury is fully intact. Mild swelling and tenderness to palpation, decreased ROM secondary to pain, no erythema, no warmth, no effusion, no deformity, no fever. not concerned for acute bony injury or septic arthritis. Discussed importance of earlier follow up with ortho and immobilization. Pt states he does not want to be charged for another sling and he still has the other one at home. Provided a second ortho referral and urged pt to be more aggressive in securing an earlier appointment. Will write a script for a few percocet to bring pt relief.  At this time there does not appear to be any evidence of an acute emergency medical condition and the patient appears stable for discharge with appropriate outpatient follow up.Diagnosis was discussed with patient who verbalizes understanding and is agreeable to discharge.   Glade Nurse, PA-C 06/10/12 2219  Glade Nurse, PA-C 06/10/12 2250

## 2012-06-10 NOTE — Assessment & Plan Note (Signed)
Elevated creatinine. Stop spironolactone. BMET by [facility] tomorrow.

## 2012-06-10 NOTE — Progress Notes (Signed)
Patient ID: [name], male   DOB: 07/08/1950, 62 y.o.   MRN: [id number]   Weight Range    219-221 pounds  Baseline proBNP    511 on 11/13/11    HPI: [name] is a 62 y.o. gentlemen with severe HF due to NICM (EF 20-25%) with multiple hospital admissions for HF exacerbations. He also has history of ventricular tachycardia s/p St Jude ICD by [name], chronic kidney disease: stage III, baseline Cr ~2, and paroxysmal atrial arrhythmias controlled by amiodarone. As well as sarcoidosis, hypertension. Cath in 2008 by Dr. [name] showed no CAD. Dry weight is 219 (11/2011). Seen at [facility] and not felt to be a transplant candidate. Due to renal failure and lack of family support.  Milrinone initiated in January 2014 for low output. 03/01/12 milrinone increased 0.332mc/kg/min.   RHC 01/26/12  RA = 14  RV = 51/16/15  PA = 57/34 (43)  PCW = 26  Fick cardiac output/index = 4.2/1.8  Thermo CO/CI = 3.9/1.7  PVR = 4.0 Woods  O2 sat = 97%  PA sat = 57%, 58%  Ao Pressure (non-invasive): 116/80 (93)  SVR = 1504   04/15/12 Creatinine 2.3 Potassium 4.4 04/22/12 Creatinine 2.55 06/03/12 Creatinine 3.26 Potassium 2.8  04/26/12 S/P Successful DC-CV  He returns for follow up. Last week he was instructed to hold torsemide for 2 days due to elevated renal functions. Complains of fatigue. Breathing better. Denies PND. + Orthopnea. Sleeps on 2 pillows. No shocks.  He continues on milrionone at 0.375 mcg. Weight at home up to 231 from 221 pounds. Followed by [facility] for home Milrinone and weekly BMET (every Monday).  No bleeding problems.    Seen at [facility] and not felt to be a transplant candidate. Due to renal failure and lack of family support.   ROS: All systems negative except as listed in HPI, PMH and Problem List.  Past Medical History  Diagnosis Date  . CHF (congestive heart failure)   . Sarcoidosis   . Cardiomyopathy, dilated, nonischemic     non ischemic by cath  . Acute on chronic systolic heart  failure   . Automatic implantable cardiac defibrillator in situ   . Atrial fibrillation   . NSVT (nonsustained ventricular tachycardia)   . GERD (gastroesophageal reflux disease)   . Hypercholesteremia   . Myocardial infarction   . Shortness of breath   . Chronic kidney disease (CKD), stage III (moderate)   . Pacemaker   . Anginal pain   . Gout   . Hypertension     dr [name]  . Coronary artery disease     Current Outpatient Prescriptions  Medication Sig Dispense Refill  . allopurinol (ZYLOPRIM) 100 MG tablet Take 1 tablet (100 mg total) by mouth daily.  30 tablet  6  . amiodarone (PACERONE) 200 MG tablet Take 1 tablet (200 mg total) by mouth 2 (two) times daily.  60 tablet  6  . apixaban (ELIQUIS) 5 MG TABS tablet Take 1 tablet (5 mg total) by mouth 2 (two) times daily.  60 tablet  3  . carvedilol (COREG) 3.125 MG tablet Take 1 tablet (3.125 mg total) by mouth 2 (two) times daily with a meal.  60 tablet  2  . esomeprazole (NEXIUM) 40 MG capsule Take 40 mg by mouth daily before breakfast.      . isosorbide-hydrALAZINE (BIDIL) 20-37.5 MG per tablet Take 0.5 tablets by mouth 3 (three) times daily.  45 tablet  1  . LORazepam (  ATIVAN) 1 MG tablet Take 0.5 tablets (0.5 mg total) by mouth 2 (two) times daily as needed for anxiety.  15 tablet  0  . magnesium oxide (MAG-OX 400) 400 MG tablet Take 1 tablet (400 mg total) by mouth daily.  90 tablet  3  . metolazone (ZAROXOLYN) 2.5 MG tablet Take 2.5 mg by mouth See admin instructions. Patient takes as directed by MD for fluid retention      . milrinone (PRIMACOR) 20 MG/100ML SOLN infusion Inject 37.3125 mcg/min into the vein continuous.  100 mL    . ondansetron (ZOFRAN-ODT) 8 MG disintegrating tablet Take 8 mg by mouth every 8 (eight) hours as needed for nausea. 8mg  ODT q4 hours prn nausea      . rosuvastatin (CRESTOR) 5 MG tablet Take 1 tablet (5 mg total) by mouth at bedtime.      Marland Kitchen spironolactone (ALDACTONE) 25 MG tablet Take 25 mg by mouth  daily.      Marland Kitchen torsemide (DEMADEX) 20 MG tablet Take 20-40 mg by mouth daily. Takes 2 tablets in the morning and 1 tablet in the evening.      . zolpidem (AMBIEN) 5 MG tablet Take 5 mg by mouth at bedtime as needed for sleep.       No current facility-administered medications for this encounter.     PHYSICAL EXAM: Filed Vitals:   06/10/12 0933  BP: 112/82  Pulse: 94  Weight: 237 lb (107.502 kg)  SpO2: 95%    General: Chronically ill appearing. NAD   HEENT: normal  Neck: supple. JVP flat. Carotids 2+ bilat; no bruits. No lymphadenopathy or thryomegaly appreciated.  Cor: PMI nondisplaced. Regular rate & rhythm. 3/82m MR. +S3  Lungs: clear Abdomen: soft, nontender, obese, mildly distended, No bruits or masses. Good bowel sounds.  Extremities: no cyanosis, clubbing, rash, no lower extremity edema RUE PICC Neuro: alert & orientedx3, cranial nerves grossly intact. moves all 4 extremities w/o difficulty. Affect pleasant    ASSESSMENT & PLAN:

## 2012-06-10 NOTE — Assessment & Plan Note (Signed)
Volume status stable despite weight gain. Continue current diuretic regimen. Stop spironolactone due to elevated creatinine. He is not on an ace inhibitor due to elevated creatinine. Continue milrinone 0.375 mcg via picc. As noted at previous visits he is not a candidate for advanced therapies due to social situation and renal failure. Requesting Viagra. Will stop nitrates. Follow up in 1 month.

## 2012-06-10 NOTE — ED Notes (Signed)
Pt reports right elbow pain for the last week. States seen here last week and given ortho referral but appt is not until July 25th. Pt reports he cannot sleep at night due to shooting pain in his right arm and elbow.

## 2012-06-10 NOTE — Patient Instructions (Addendum)
Stop Spironolactone  Follow up in 1 month  Do the following things EVERYDAY: 1) Weigh yourself in the morning before breakfast. Write it down and keep it in a log. 2) Take your medicines as prescribed 3) Eat low salt foods-Limit salt (sodium) to 2000 mg per day.  4) Stay as active as you can everyday 5) Limit all fluids for the day to less than 2 liters

## 2012-06-12 ENCOUNTER — Encounter: Payer: Self-pay | Admitting: Internal Medicine

## 2012-06-14 DIAGNOSIS — R0989 Other specified symptoms and signs involving the circulatory and respiratory systems: Secondary | ICD-10-CM

## 2012-06-14 NOTE — ED Provider Notes (Signed)
History/physical exam/procedure(s) were performed by non-physician practitioner and as supervising physician I was immediately available for consultation/collaboration. I have reviewed all notes and am in agreement with care and plan.   Bray Vickerman S Bettina Warn, MD 06/14/12 0025 

## 2012-06-18 ENCOUNTER — Telehealth (HOSPITAL_COMMUNITY): Payer: Self-pay | Admitting: *Deleted

## 2012-06-18 NOTE — Telephone Encounter (Signed)
Pt called and requested records be faxed to his fiancee Sharlyne Pacas, NP at [phone], last 2 ov notes and labs faxed

## 2012-06-24 ENCOUNTER — Encounter: Payer: Self-pay | Admitting: Internal Medicine

## 2012-06-27 ENCOUNTER — Encounter: Payer: Self-pay | Admitting: Internal Medicine

## 2012-06-27 NOTE — Progress Notes (Signed)
This encounter was created in error - please disregard.

## 2012-06-30 IMAGING — CT CT HEAD W/O CM
1 of 2 series · 13 of 30 positions shown, 17 images · non-contrast
Comparison: 09/01/2009

CLINICAL DATA: Severe progressive headache.

CT HEAD WITHOUT CONTRAST
TECHNIQUE: Contiguous axial images were obtained from the base of
the skull through the vertex without contrast.

[Series 2: brain · axial · 0.47mm/px · z∈[-91,+43]mm · 13 of 32 slices shown, 17 images]
[im 3/32  brain]
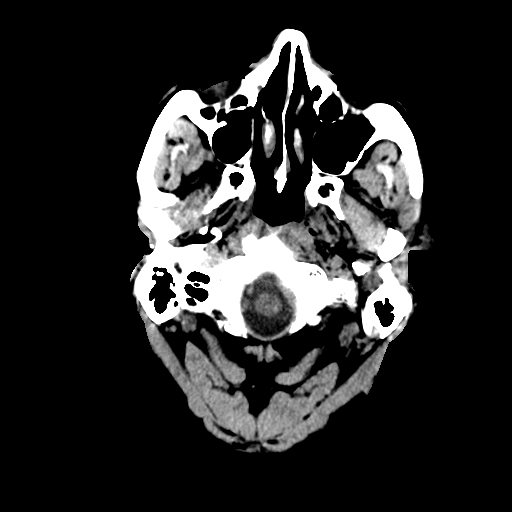
[im 3/32  bone]
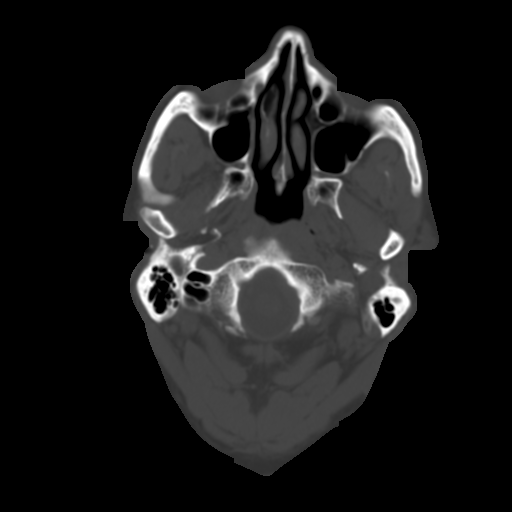
[im 5/32  brain]
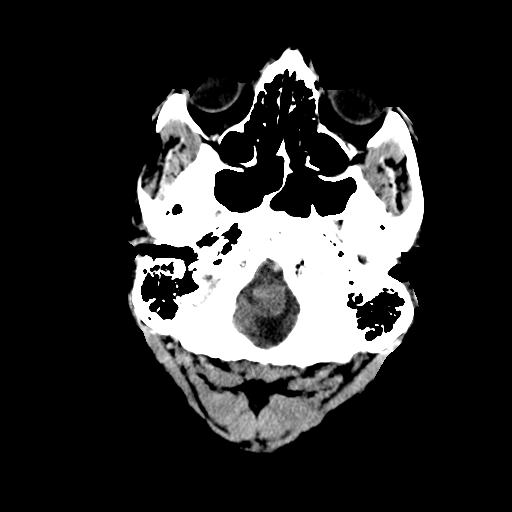
[im 7/32  brain]
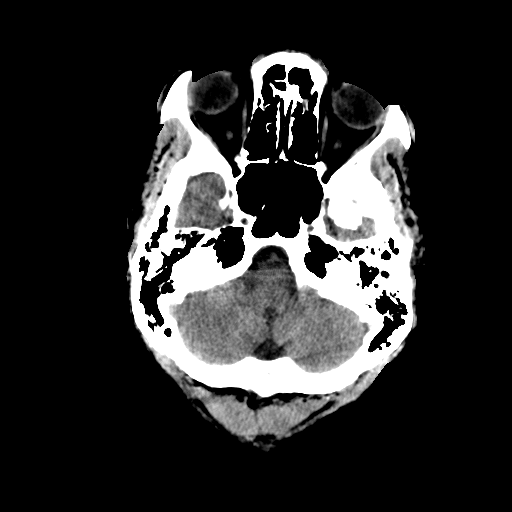
[im 9/32  brain]
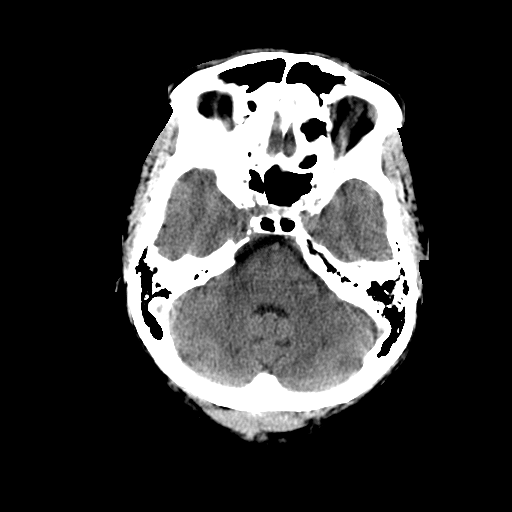
[im 12/32  brain]
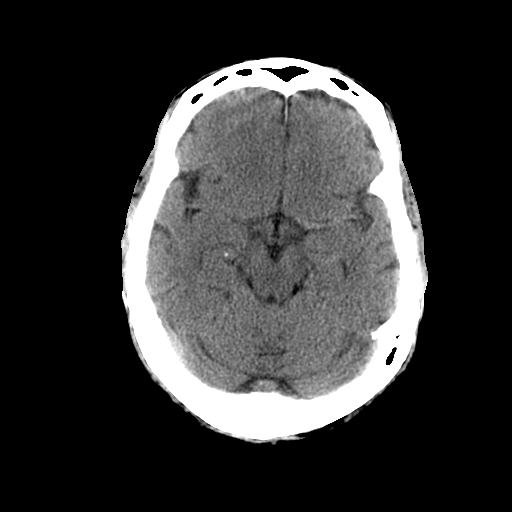
[im 12/32  bone]
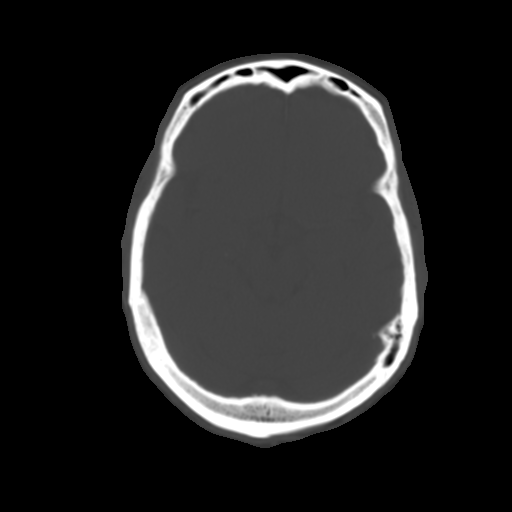
[im 14/32  brain]
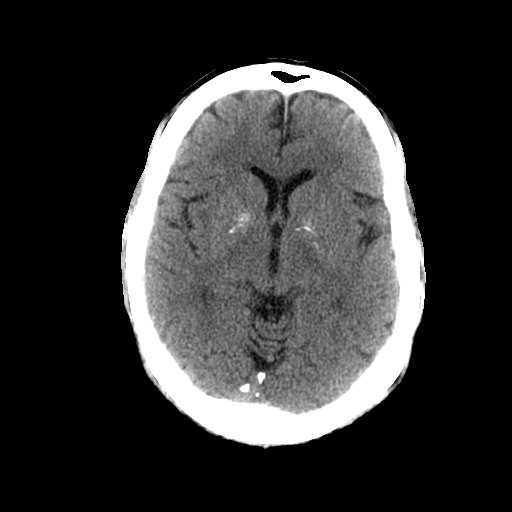
[im 16/32  brain]
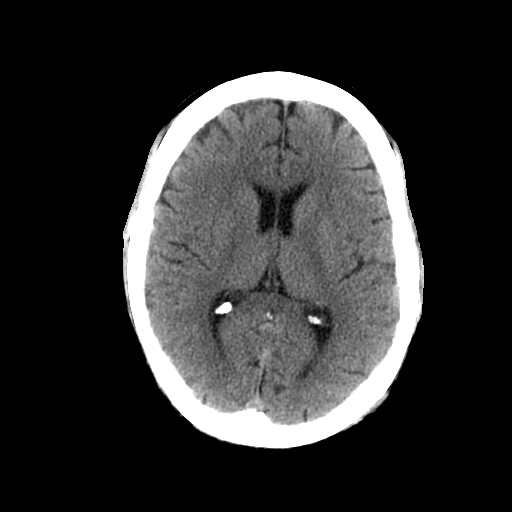
[im 18/32  brain]
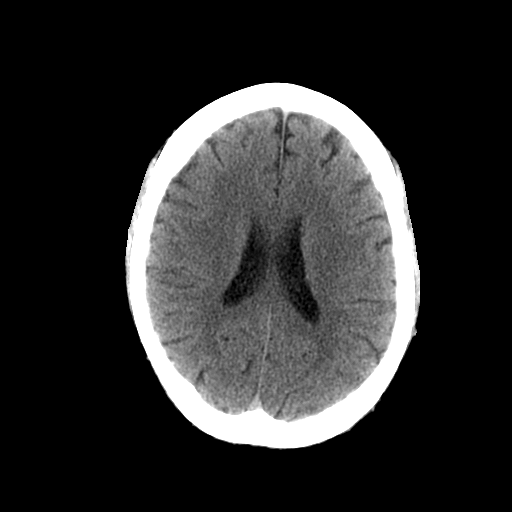
[im 20/32  brain]
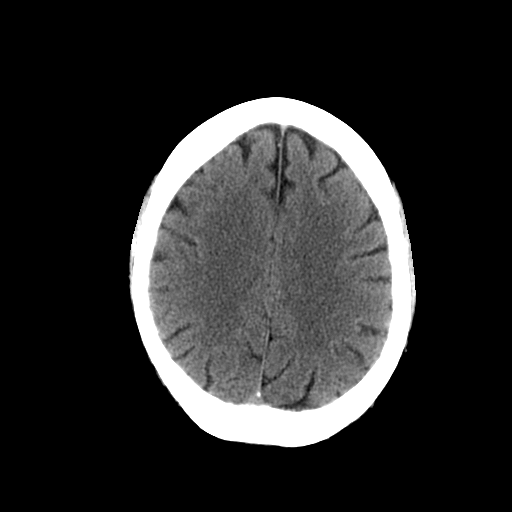
[im 20/32  bone]
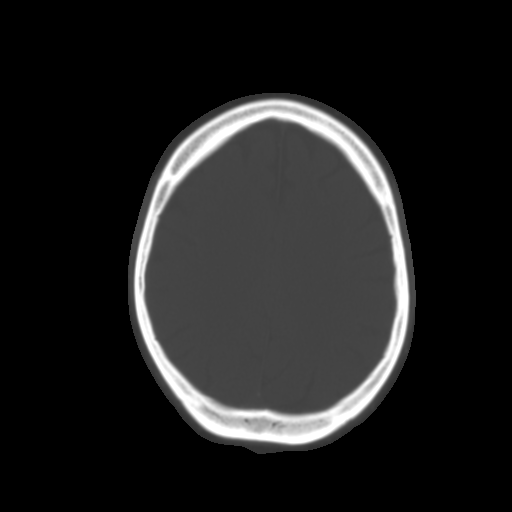
[im 23/32  brain]
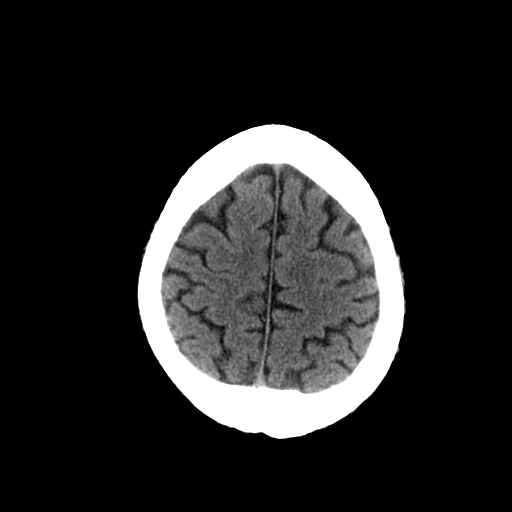
[im 25/32  brain]
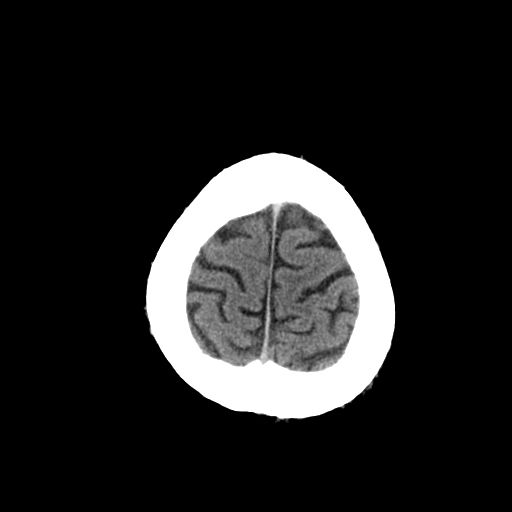
[im 27/32  brain]
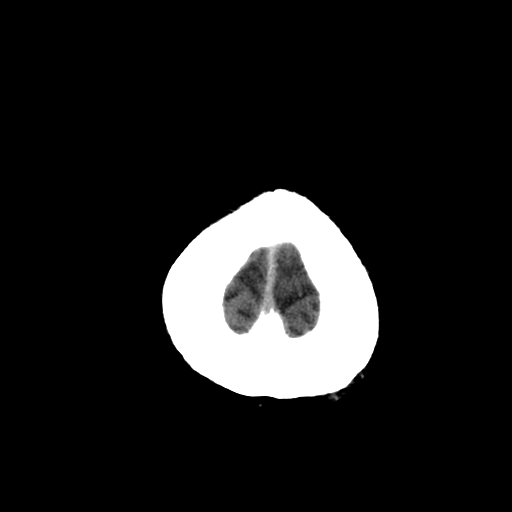
[im 29/32  brain]
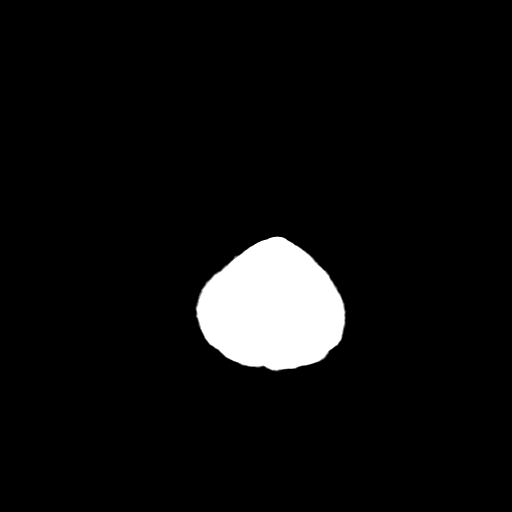
[im 29/32  bone]
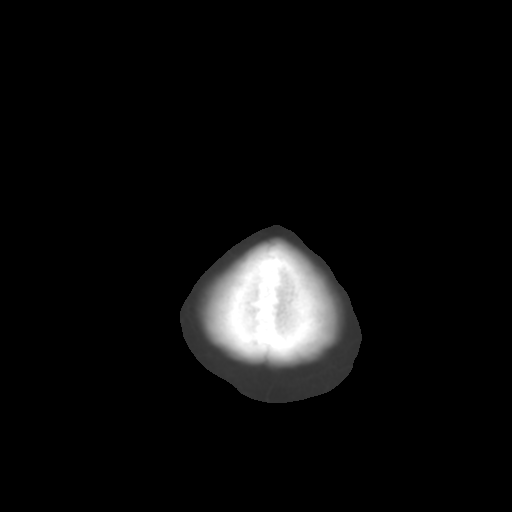

[13 of 30 positions shown; findings below may reference images not displayed]

FINDINGS: There is no acute intracranial hemorrhage, infarction, or
mass lesion.  No significant abnormality of the brain parenchyma.
Benign basal ganglia calcifications, unchanged.

No acute osseous abnormality.  Old healed fracture of the anterior
wall of the frontal sinus.
IMPRESSION: No acute abnormalities.

## 2012-07-01 ENCOUNTER — Ambulatory Visit (INDEPENDENT_AMBULATORY_CARE_PROVIDER_SITE_OTHER): Payer: PRIVATE HEALTH INSURANCE | Admitting: *Deleted

## 2012-07-01 ENCOUNTER — Encounter: Payer: Self-pay | Admitting: Internal Medicine

## 2012-07-01 DIAGNOSIS — I428 Other cardiomyopathies: Secondary | ICD-10-CM

## 2012-07-01 DIAGNOSIS — I420 Dilated cardiomyopathy: Secondary | ICD-10-CM

## 2012-07-01 LAB — ICD DEVICE OBSERVATION
DEV-0020ICD: NEGATIVE
DEVICE MODEL ICD: 521670
HV IMPEDENCE: 54 Ohm
PACEART VT: 0
RV LEAD THRESHOLD: 1.25 V
TOT-0007: 1
TOT-0008: 0
TOT-0009: 4
TZAT-0004SLOWVT: 8
TZAT-0012SLOWVT: 200 ms
TZAT-0013SLOWVT: 2
TZAT-0018SLOWVT: NEGATIVE
TZAT-0019SLOWVT: 7.5 V
TZON-0003SLOWVT: 300 ms
TZON-0004SLOWVT: 20
TZON-0010SLOWVT: 80 ms
TZST-0001SLOWVT: 3
TZST-0001SLOWVT: 5
TZST-0003SLOWVT: 36 J
TZST-0003SLOWVT: 36 J
TZST-0003SLOWVT: 36 J

## 2012-07-01 NOTE — Progress Notes (Signed)
ICD check in office. 

## 2012-07-09 ENCOUNTER — Telehealth (HOSPITAL_COMMUNITY): Payer: Self-pay | Admitting: Adult Health

## 2012-07-09 NOTE — Telephone Encounter (Signed)
Provided with lab results.Creatinine 3.15 Potassium 3.3  Instructed to hold torsemide and take 40 meq of KDUR tonight and in am.   Follow up in HF clinic in am as scheduled.   Will need repeat BMET next week.   CLEGG,AMY 4:50 PM

## 2012-07-10 ENCOUNTER — Encounter: Payer: Self-pay | Admitting: Internal Medicine

## 2012-07-10 ENCOUNTER — Encounter (HOSPITAL_COMMUNITY): Payer: Self-pay

## 2012-07-10 ENCOUNTER — Ambulatory Visit (HOSPITAL_COMMUNITY)
Admission: RE | Admit: 2012-07-10 | Discharge: 2012-07-10 | Disposition: A | Discharge status: Home / Self Care | Payer: PRIVATE HEALTH INSURANCE | Source: Ambulatory Visit | Attending: Internal Medicine | Admitting: Internal Medicine

## 2012-07-10 VITALS — BP 90/70 | HR 100 | Wt 227.0 lb

## 2012-07-10 DIAGNOSIS — I428 Other cardiomyopathies: Secondary | ICD-10-CM | POA: Insufficient documentation

## 2012-07-10 DIAGNOSIS — E7800 Pure hypercholesterolemia, unspecified: Secondary | ICD-10-CM | POA: Insufficient documentation

## 2012-07-10 DIAGNOSIS — Z95810 Presence of automatic (implantable) cardiac defibrillator: Secondary | ICD-10-CM | POA: Insufficient documentation

## 2012-07-10 DIAGNOSIS — K219 Gastro-esophageal reflux disease without esophagitis: Secondary | ICD-10-CM | POA: Insufficient documentation

## 2012-07-10 DIAGNOSIS — I4891 Unspecified atrial fibrillation: Secondary | ICD-10-CM | POA: Insufficient documentation

## 2012-07-10 DIAGNOSIS — I2510 Atherosclerotic heart disease of native coronary artery without angina pectoris: Secondary | ICD-10-CM | POA: Insufficient documentation

## 2012-07-10 DIAGNOSIS — M109 Gout, unspecified: Secondary | ICD-10-CM | POA: Insufficient documentation

## 2012-07-10 DIAGNOSIS — I5022 Chronic systolic (congestive) heart failure: Secondary | ICD-10-CM | POA: Insufficient documentation

## 2012-07-10 DIAGNOSIS — I509 Heart failure, unspecified: Secondary | ICD-10-CM | POA: Insufficient documentation

## 2012-07-10 DIAGNOSIS — I129 Hypertensive chronic kidney disease with stage 1 through stage 4 chronic kidney disease, or unspecified chronic kidney disease: Secondary | ICD-10-CM | POA: Insufficient documentation

## 2012-07-10 DIAGNOSIS — N183 Chronic kidney disease, stage 3 unspecified: Secondary | ICD-10-CM | POA: Insufficient documentation

## 2012-07-10 DIAGNOSIS — N182 Chronic kidney disease, stage 2 (mild): Secondary | ICD-10-CM

## 2012-07-10 MED ORDER — POTASSIUM CHLORIDE CRYS ER 20 MEQ PO TBCR: 20.0000 meq | EXTENDED_RELEASE_TABLET | Freq: Two times a day (BID) | ORAL | Status: DC

## 2012-07-10 MED ORDER — TORSEMIDE 20 MG PO TABS: 40.0000 mg | ORAL_TABLET | Freq: Every day | ORAL | Status: DC

## 2012-07-10 NOTE — Patient Instructions (Addendum)
Follow up in 1 month  Hold Torsemide today and tomorrow then restart Torsemide 40 mg once a day  Do the following things EVERYDAY: 1) Weigh yourself in the morning before breakfast. Write it down and keep it in a log. 2) Take your medicines as prescribed 3) Eat low salt foods-Limit salt (sodium) to 2000 mg per day.  4) Stay as active as you can everyday 5) Limit all fluids for the day to less than 2 liters

## 2012-07-10 NOTE — Assessment & Plan Note (Signed)
Creatinine 3.1. Instructed to hold torsemide for 48 hours and restart torsemide at 40 mg daily. Repeat BMET next week.

## 2012-07-10 NOTE — Assessment & Plan Note (Signed)
NYHA III b. Volume status stable. Reviewed BMET form this week. Creatinine 3.1. Instructed to hold torsemide for 2 days and then cut back torsemide to 40 mg daily. Repeat BMET next week. Reviewed by [facility] and he is not candidate for advanced therapies. He will remain on Milrinone at 0.375 mcg indefinitely. He is not on an Ace Inhibitor/Spironoactone due to renal failure and no nitrates due to Viagra use. Follow up in 1 month.

## 2012-07-10 NOTE — Progress Notes (Signed)
Patient ID: [name], male   DOB: 09/17/1950, 62 y.o.   MRN: [id number]   Weight Range    219-221 pounds  Baseline proBNP    511 on 11/13/11    HPI: [name] is a 62 y.o. gentlemen with severe HF due to NICM (EF 20-25%) with multiple hospital admissions for HF exacerbations. He also has history of ventricular tachycardia s/p St Jude ICD by Darrell. Ladona Ridgel, chronic kidney disease: stage III, baseline Cr ~2, and paroxysmal atrial arrhythmias controlled by amiodarone. As well as sarcoidosis, hypertension. Cath in 2008 by Darrell. [name] showed no CAD. Dry weight is 219 (11/2011). Seen at [facility] and not felt to be a transplant candidate. Due to renal failure and lack of family support.  Milrinone initiated in January 2014 for low output. 03/01/12 milrinone increased 0.3108mc/kg/min.   RHC 01/26/12  RA = 14  RV = 51/16/15  PA = 57/34 (43)  PCW = 26  Fick cardiac output/index = 4.2/1.8  Thermo CO/CI = 3.9/1.7  PVR = 4.0 Woods  O2 sat = 97%  PA sat = 57%, 58%  Ao Pressure (non-invasive): 116/80 (93)  SVR = 1504   04/15/12 Creatinine 2.3 Potassium 4.4 04/22/12 Creatinine 2.55 06/03/12 Creatinine 3.26 Potassium 2.8 06/17/12 Creatinine 2.76 Potassium 3.9 07/08/12 Creatinine 3.15 Potassium 3.3  04/26/12 S/P Successful DC-CV  He returns for follow up. Last week spironolactone stopped due to elevated creatinine and IMDUR stopped due to use of Viagra.   Mild exertional dyspnea. Denies PND/Orthopnea. Weight at home 221-222 pounds. Goal for weight is 219-221 pounds. He continues on Milrinone at 0.375 mcg via picc. Followed by [facility] for inotropes and labs. Had high salt foods over the weekend. He was evaluated at [facility] for possible advanced therapies 2 weeks ago and he was not a candidate for LVAD.     He is not on an ace inhibitor/Spironolactone due to elevated creatinine. Not on nitrates due to Viagra use.   ROS: All systems negative except as listed in HPI, PMH and Problem List.  Past Medical History   Diagnosis Date  . CHF (congestive heart failure)   . Sarcoidosis   . Cardiomyopathy, dilated, nonischemic     non ischemic by cath  . Acute on chronic systolic heart failure   . Automatic implantable cardiac defibrillator in situ   . Atrial fibrillation   . NSVT (nonsustained ventricular tachycardia)   . GERD (gastroesophageal reflux disease)   . Hypercholesteremia   . Myocardial infarction   . Shortness of breath   . Chronic kidney disease (CKD), stage III (moderate)   . Pacemaker   . Anginal pain   . Gout   . Hypertension     [name]  . Coronary artery disease     Current Outpatient Prescriptions  Medication Sig Dispense Refill  . allopurinol (ZYLOPRIM) 100 MG tablet Take 1 tablet (100 mg total) by mouth daily.  30 tablet  6  . amiodarone (PACERONE) 200 MG tablet Take 1 tablet (200 mg total) by mouth 2 (two) times daily.  60 tablet  6  . apixaban (ELIQUIS) 5 MG TABS tablet Take 5 mg by mouth 3 (three) times daily.      . carvedilol (COREG) 3.125 MG tablet Take 1 tablet (3.125 mg total) by mouth 2 (two) times daily with a meal.  60 tablet  2  . esomeprazole (NEXIUM) 40 MG capsule Take 40 mg by mouth daily as needed (for acid reflux).       Marland Kitchen  hydrALAZINE (APRESOLINE) 25 MG tablet Take 1.5 tablets (37.5 mg total) by mouth 3 (three) times daily.  135 tablet  3  . LORazepam (ATIVAN) 1 MG tablet Take 0.5 tablets (0.5 mg total) by mouth 2 (two) times daily as needed for anxiety.  15 tablet  0  . metolazone (ZAROXOLYN) 2.5 MG tablet Take 1 tablet (2.5 mg total) by mouth See admin instructions. Patient takes as directed by MD for fluid retention  8 tablet  3  . milrinone (PRIMACOR) 20 MG/100ML SOLN infusion Inject 37.3125 mcg/min into the vein continuous.  100 mL    . ondansetron (ZOFRAN-ODT) 8 MG disintegrating tablet Take 8 mg by mouth every 8 (eight) hours as needed for nausea. 8mg  ODT q4 hours prn nausea      . potassium chloride SA (K-DUR,KLOR-CON) 20 MEQ tablet Take 1 tablet  (20 mEq total) by mouth 2 (two) times daily.  60 tablet  3  . rosuvastatin (CRESTOR) 5 MG tablet Take 1 tablet (5 mg total) by mouth at bedtime.      . sildenafil (VIAGRA) 100 MG tablet Take 1 tablet (100 mg total) by mouth daily as needed for erectile dysfunction.  4 tablet  1  . torsemide (DEMADEX) 20 MG tablet Take 20 mg by mouth 2 (two) times daily. 2 po in the am (40mg ) and 1 in the pm (20mg ).       No current facility-administered medications for this encounter.     PHYSICAL EXAM: Filed Vitals:   07/10/12 0912  BP: 90/70  Pulse: 100  Weight: 227 lb (102.967 kg)  SpO2: 98%    General: Chronically ill appearing. NAD   HEENT: normal  Neck: supple. JVP flat. Carotids 2+ bilat; no bruits. No lymphadenopathy or thryomegaly appreciated.  Cor: PMI nondisplaced. Regular rate & rhythm. 3/37m MR. +S3  Lungs: clear Abdomen: soft, nontender, obese, mildly distended, No bruits or masses. Good bowel sounds.  Extremities: no cyanosis, clubbing, rash, no lower extremity edema RUE PICC Neuro: alert & orientedx3, cranial nerves grossly intact. moves all 4 extremities w/o difficulty. Affect pleasant    ASSESSMENT & PLAN:

## 2012-07-15 ENCOUNTER — Other Ambulatory Visit (HOSPITAL_COMMUNITY): Payer: Self-pay | Admitting: Anesthesiology

## 2012-07-15 ENCOUNTER — Encounter (HOSPITAL_COMMUNITY): Payer: Self-pay

## 2012-07-15 ENCOUNTER — Other Ambulatory Visit: Payer: Self-pay

## 2012-07-15 ENCOUNTER — Ambulatory Visit (HOSPITAL_COMMUNITY)
Admission: RE | Admit: 2012-07-15 | Discharge: 2012-07-15 | Disposition: A | Discharge status: Home / Self Care | Payer: PRIVATE HEALTH INSURANCE | Source: Ambulatory Visit | Attending: Cardiology | Admitting: Cardiology

## 2012-07-15 ENCOUNTER — Encounter (HOSPITAL_COMMUNITY): Payer: Self-pay | Admitting: *Deleted

## 2012-07-15 ENCOUNTER — Telehealth (HOSPITAL_COMMUNITY): Payer: Self-pay | Admitting: *Deleted

## 2012-07-15 ENCOUNTER — Encounter: Payer: Self-pay | Admitting: Internal Medicine

## 2012-07-15 VITALS — BP 88/1 | HR 96 | Wt 228.8 lb

## 2012-07-15 DIAGNOSIS — I509 Heart failure, unspecified: Secondary | ICD-10-CM | POA: Insufficient documentation

## 2012-07-15 DIAGNOSIS — N182 Chronic kidney disease, stage 2 (mild): Secondary | ICD-10-CM | POA: Diagnosis present

## 2012-07-15 DIAGNOSIS — I5022 Chronic systolic (congestive) heart failure: Secondary | ICD-10-CM | POA: Diagnosis present

## 2012-07-15 DIAGNOSIS — I4892 Unspecified atrial flutter: Secondary | ICD-10-CM | POA: Diagnosis present

## 2012-07-15 DIAGNOSIS — I5040 Unspecified combined systolic (congestive) and diastolic (congestive) heart failure: Secondary | ICD-10-CM | POA: Insufficient documentation

## 2012-07-15 DIAGNOSIS — N189 Chronic kidney disease, unspecified: Secondary | ICD-10-CM | POA: Insufficient documentation

## 2012-07-15 MED ORDER — APIXABAN 5 MG PO TABS: 5.0000 mg | ORAL_TABLET | Freq: Two times a day (BID) | ORAL | Status: DC

## 2012-07-15 MED ORDER — AMIODARONE HCL 400 MG PO TABS: 400.0000 mg | ORAL_TABLET | Freq: Two times a day (BID) | ORAL | Status: DC

## 2012-07-15 NOTE — Progress Notes (Signed)
Patient ID: [name], male   DOB: 29-Nov-1950, 62 y.o.   MRN: [id number]   Weight Range    219-221 pounds  Baseline proBNP    511 on 11/13/11    HPI: [name] is a 62 y.o. gentlemen with severe HF due to NICM (EF 20-25%) with multiple hospital admissions for HF exacerbations. He also has history of ventricular tachycardia s/p St Jude ICD by [name], chronic kidney disease: stage III, baseline Cr ~2, and paroxysmal atrial arrhythmias on amiodarone as well as sarcoidosis and hypertension. Cath in 2008 by Dr. [name] showed no CAD. Dry weight is 219 (11/2011). Seen at [facility] and not felt to be a transplant candidate due to renal failure and lack of family support.  Milrinone initiated in January 2014 for low output. 03/01/12 milrinone increased 0.334mc/kg/min.   RHC 01/26/12  RA = 14  RV = 51/16/15  PA = 57/34 (43)  PCW = 26  Fick cardiac output/index = 4.2/1.8  Thermo CO/CI = 3.9/1.7  PVR = 4.0 Woods  O2 sat = 97%  PA sat = 57%, 58%  Ao Pressure (non-invasive): 116/80 (93)  SVR = 1504   04/26/12 S/P Successful DC-CV for atrial fibrillation  04/15/12 Creatinine 2.3 Potassium 4.4 04/22/12 Creatinine 2.55 06/03/12 Creatinine 3.26 Potassium 2.8 06/17/12 Creatinine 2.76 Potassium 3.9 07/08/12 Creatinine 3.15 Potassium 3.3 07/15/12 Creatinine 3.21  Potassium 3.6  He returns for a urgent work in appt due to increased SOB, palpitations and dizziness. Called on Friday and his hydralazine was cut back to 25 mg TID however has been out since Saturday. Last visit his torsemide was held for 2 days due to Cr being increased to 3.1. Weight at home 224 lbs, goal weight is 219-221 (only up 1 lb compared to prior office appointment). +orthopnea.    He is not on an ace inhibitor/spironolactone due to elevated creatinine. Not on nitrates due to Viagra use.   ROS: All systems negative except as listed in HPI, PMH and Problem List.  Past Medical History  Diagnosis Date  . CHF (congestive heart  failure)   . Sarcoidosis   . Cardiomyopathy, dilated, nonischemic     non ischemic by cath  . Acute on chronic systolic heart failure   . Automatic implantable cardiac defibrillator in situ   . Atrial fibrillation   . NSVT (nonsustained ventricular tachycardia)   . GERD (gastroesophageal reflux disease)   . Hypercholesteremia   . Myocardial infarction   . Shortness of breath   . Chronic kidney disease (CKD), stage III (moderate)   . Pacemaker   . Anginal pain   . Gout   . Hypertension             Current Outpatient Prescriptions  Medication Sig Dispense Refill  . allopurinol (ZYLOPRIM) 100 MG tablet Take 1 tablet (100 mg total) by mouth daily.  30 tablet  6  . amiodarone (PACERONE) 400 MG tablet Take 1 tablet (400 mg total) by mouth 2 (two) times daily.  60 tablet  6  . apixaban (ELIQUIS) 5 MG TABS tablet Take 1 tablet (5 mg total) by mouth 2 (two) times daily.  60 tablet  3  . carvedilol (COREG) 3.125 MG tablet Take 1 tablet (3.125 mg total) by mouth 2 (two) times daily with a meal.  60 tablet  2  . esomeprazole (NEXIUM) 40 MG capsule Take 40 mg by mouth daily as needed (for acid reflux).       . LORazepam (ATIVAN) 1 MG  tablet Take 0.5 tablets (0.5 mg total) by mouth 2 (two) times daily as needed for anxiety.  15 tablet  0  . metolazone (ZAROXOLYN) 2.5 MG tablet Take 1 tablet (2.5 mg total) by mouth See admin instructions. Patient takes as directed by MD for fluid retention  8 tablet  3  . milrinone (PRIMACOR) 20 MG/100ML SOLN infusion Inject 37.3125 mcg/min into the vein continuous.  100 mL    . ondansetron (ZOFRAN-ODT) 8 MG disintegrating tablet Take 8 mg by mouth every 8 (eight) hours as needed for nausea. 8mg  ODT q4 hours prn nausea      . potassium chloride SA (K-DUR,KLOR-CON) 20 MEQ tablet Take 1 tablet (20 mEq total) by mouth 2 (two) times daily.  60 tablet  3  . rosuvastatin (CRESTOR) 5 MG tablet Take 1 tablet (5 mg total) by mouth at bedtime.      . sildenafil (VIAGRA) 100  MG tablet Take 1 tablet (100 mg total) by mouth daily as needed for erectile dysfunction.  4 tablet  1  . torsemide (DEMADEX) 20 MG tablet Take 2 tablets (40 mg total) by mouth daily.  60 tablet  3   No current facility-administered medications for this encounter.     PHYSICAL EXAM: Filed Vitals:   07/15/12 1543  BP: 88/1  Pulse: 96  Weight: 228 lb 12 oz (103.76 kg)  SpO2: 98%   EKG: Suspect atypical atrial flutter, rate 121 bpm   General: Chronically ill appearing. NAD   HEENT: normal  Neck: supple. JVP difficult to assess but does not appear elevated. Carotids 2+ bilat; no bruits. No lymphadenopathy or thryomegaly appreciated.  Cor: PMI nondisplaced. Tachycardic and regular S1S2 with no gallop and 2/6 HSM apex.  Lungs: clear Abdomen: soft, nontender, obese, mildly distended, No bruits or masses. Good bowel sounds.  Extremities: no cyanosis, clubbing, rash, no lower extremity edema RUE PICC Neuro: alert & orientedx3, cranial nerves grossly intact. moves all 4 extremities w/o difficulty. Affect pleasant    ASSESSMENT & PLAN:   1) Chronic systolic HF: Nonischemic cardiomyopathy with EF 15% on last echo.  NYHA IV symptoms. Cr elevated from last week to 3.21.  I suspect worsened symptoms are due to the onset of atypical atrial flutter with RVR.  He does not appear markedly volume overloaded and weight is only up 1 lb since last appointment.  I will continue demadex at 40 mg daily, especially given rise in creatinine. SBP 80-90s with dizziness, will stop hydralazine.  Continue current milrinone.  Can continue low dose Coreg for now to help with rate control. Lengthy discussion with patient about poor prognosis and would like to consult palliative/hospice care to help with symptom management. He was interested in this and very appreciative of the care he has received from the team. Will place consult.  2) Aflutter: Appears to be atypical flutter with RVR on exam.  This likely has led to  exacerbation of his symptoms.  He has been taking apixaban without missing doses for > 1 month.  Will increase Amiodarone to 400 mg BID and set up for DCCV on Wednesday.  Reload with amiodarone and hope that this can hold him in NSR despite need for milrinone.  Check TSH/LFTs with next set of labs given amiodarone use.  3) CKD: Cr 3.21, has been rising, will continue diuretics as ordered (without increase today). Suspect cardiorenal component despite milrinone.  Resumption of NSR may help with better hemodynamics.  BMET/BNP to be repeated in 1 week.  Followup next week after DCCV.   Marca Ancona 07/15/2012

## 2012-07-15 NOTE — Telephone Encounter (Signed)
Referral called into [facility]

## 2012-07-15 NOTE — Patient Instructions (Addendum)
Stop taking hydralazine.  Increase Amiodarone to 400 mg twice a day.  Stop taking Eliquis (apixaban) 5mg  three times a day and start taking 2 times a day.  Continue taking torsemide 40 mg daily.   Will set up for cardioversion tomorrow.   Will set up Palliative Care/Hospice referral

## 2012-07-17 ENCOUNTER — Encounter (HOSPITAL_COMMUNITY): Payer: Self-pay | Admitting: Anesthesiology

## 2012-07-17 ENCOUNTER — Ambulatory Visit (HOSPITAL_COMMUNITY)
Admission: RE | Admit: 2012-07-17 | Discharge: 2012-07-17 | Disposition: A | Discharge status: Home / Self Care | Payer: PRIVATE HEALTH INSURANCE | Source: Ambulatory Visit | Attending: Cardiology | Admitting: Cardiology

## 2012-07-17 ENCOUNTER — Encounter (HOSPITAL_COMMUNITY)
Admission: RE | Disposition: A | Discharge status: Home / Self Care | Payer: Self-pay | Source: Ambulatory Visit | Attending: Cardiology

## 2012-07-17 DIAGNOSIS — Z538 Procedure and treatment not carried out for other reasons: Secondary | ICD-10-CM | POA: Insufficient documentation

## 2012-07-17 DIAGNOSIS — I4892 Unspecified atrial flutter: Secondary | ICD-10-CM | POA: Insufficient documentation

## 2012-07-17 DIAGNOSIS — Z79899 Other long term (current) drug therapy: Secondary | ICD-10-CM | POA: Insufficient documentation

## 2012-07-17 LAB — BASIC METABOLIC PANEL
CO2: 27 mEq/L (ref 19–32)
Chloride: 86 mEq/L — ABNORMAL LOW (ref 96–112)
Sodium: 129 mEq/L — ABNORMAL LOW (ref 135–145)

## 2012-07-17 SURGERY — CANCELLED PROCEDURE

## 2012-07-17 SURGERY — CARDIOVERSION
Anesthesia: Monitor Anesthesia Care

## 2012-07-17 MED ORDER — SODIUM CHLORIDE 0.9 % IV SOLN
INTRAVENOUS | Status: DC
  Administered 2012-07-17: 13:00:00 via INTRAVENOUS

## 2012-07-17 NOTE — H&P (Signed)
Patient presented today for cardioversion: was in atypical atrial flutter with rate in the 120s when I saw him in clinic earlier this week.  Today, he is in NSR and does not need cardioversion.  He feels better today.   - Continue amiodarone 400 mg bid x 2 weeks total.  - Decrease amiodarone to 200 mg bid after 2 wks.  - Needs followup in CHF clinic next week.   [name] 07/17/2012 1:07 PM

## 2012-07-19 ENCOUNTER — Encounter: Payer: Self-pay | Admitting: Internal Medicine

## 2012-07-19 ENCOUNTER — Telehealth (HOSPITAL_COMMUNITY): Payer: Self-pay | Admitting: Anesthesiology

## 2012-07-19 MED ORDER — APIXABAN 5 MG PO TABS: 2.5000 mg | ORAL_TABLET | Freq: Two times a day (BID) | ORAL | Status: DC

## 2012-07-19 NOTE — Telephone Encounter (Signed)
Patient had labs for DC-CV this past Wednesday and Cr up to 4.04, K+ 5.0 and Na+ 129. Instructed patient to hold torsemide for 2 days and to stop taking his potassium. Also instructed to cut apixaban back to 2.5 mg BID. Will have labs rechecked by [facility] on Tuesday. Discussed with patient again about concern of his HF symptoms and getting Palliative Care/Hospice involved. Patient wants to keep ICD on at this point, so cannot enroll in palliative care/hospice. Patient has another appt with [place] on Wednesday next week will reassess.  Addendum: Talked to patient's fiance and explained his condition and why palliative care/hospice will not take patient without turning ICD off. Explained to her and the patient that the patient is end stage HF and there are no advanced options we can offer him. Discussed that our main concern right now is his renal function is getting worse and it is hard to keep the fluid off. Our goal is to keep patient comfortable. Will re-discuss next appt.    Aundria Rud 12:53 PM

## 2012-07-24 ENCOUNTER — Ambulatory Visit (HOSPITAL_COMMUNITY)
Admission: RE | Admit: 2012-07-24 | Discharge: 2012-07-24 | Disposition: A | Discharge status: Home / Self Care | Payer: PRIVATE HEALTH INSURANCE | Source: Ambulatory Visit | Attending: Cardiology | Admitting: Cardiology

## 2012-07-24 ENCOUNTER — Encounter (HOSPITAL_COMMUNITY): Payer: Self-pay

## 2012-07-24 VITALS — BP 100/74 | HR 103 | Wt 231.8 lb

## 2012-07-24 DIAGNOSIS — Z79899 Other long term (current) drug therapy: Secondary | ICD-10-CM | POA: Insufficient documentation

## 2012-07-24 DIAGNOSIS — N183 Chronic kidney disease, stage 3 unspecified: Secondary | ICD-10-CM | POA: Insufficient documentation

## 2012-07-24 DIAGNOSIS — I5023 Acute on chronic systolic (congestive) heart failure: Secondary | ICD-10-CM | POA: Insufficient documentation

## 2012-07-24 DIAGNOSIS — I509 Heart failure, unspecified: Secondary | ICD-10-CM | POA: Insufficient documentation

## 2012-07-24 DIAGNOSIS — N184 Chronic kidney disease, stage 4 (severe): Secondary | ICD-10-CM

## 2012-07-24 DIAGNOSIS — I129 Hypertensive chronic kidney disease with stage 1 through stage 4 chronic kidney disease, or unspecified chronic kidney disease: Secondary | ICD-10-CM | POA: Insufficient documentation

## 2012-07-24 DIAGNOSIS — Z95810 Presence of automatic (implantable) cardiac defibrillator: Secondary | ICD-10-CM | POA: Insufficient documentation

## 2012-07-24 DIAGNOSIS — E7800 Pure hypercholesterolemia, unspecified: Secondary | ICD-10-CM | POA: Insufficient documentation

## 2012-07-24 DIAGNOSIS — I5022 Chronic systolic (congestive) heart failure: Secondary | ICD-10-CM

## 2012-07-24 DIAGNOSIS — I4729 Other ventricular tachycardia: Secondary | ICD-10-CM | POA: Insufficient documentation

## 2012-07-24 DIAGNOSIS — I4892 Unspecified atrial flutter: Secondary | ICD-10-CM

## 2012-07-24 DIAGNOSIS — I472 Ventricular tachycardia, unspecified: Secondary | ICD-10-CM | POA: Insufficient documentation

## 2012-07-24 MED ORDER — METOLAZONE 2.5 MG PO TABS: 2.5000 mg | ORAL_TABLET | ORAL | Status: DC

## 2012-07-24 NOTE — Patient Instructions (Addendum)
Take 2.5 mg of Metolazone today  Follow up August 13 at 9:20  Do the following things EVERYDAY: 1) Weigh yourself in the morning before breakfast. Write it down and keep it in a log. 2) Take your medicines as prescribed 3) Eat low salt foods-Limit salt (sodium) to 2000 mg per day.  4) Stay as active as you can everyday 5) Limit all fluids for the day to less than 2 liters

## 2012-07-24 NOTE — Progress Notes (Signed)
Patient ID: [name], male   DOB: 1950-04-11, 62 y.o.   MRN: [id number]   Weight Range    219-221 pounds  Baseline proBNP    511 on 11/13/11    HPI: [name] is a 62 y.o. gentlemen with severe HF due to NICM (EF 20-25%) with multiple hospital admissions for HF exacerbations. He also has history of ventricular tachycardia s/p St Jude ICD by [name], CKD: stage IV, baseline Cr ~2, and paroxysmal atrial arrhythmias on amiodarone as well as sarcoidosis and hypertension. Cath in 2008 by Dr. [name] showed no CAD.Seen at [facility] and not felt to be a transplant candidate due to renal failure and lack of family support.  Milrinone initiated in January 2014 for low output. 03/01/12 milrinone increased 0.338mc/kg/min.   RHC 01/26/12  RA = 14  RV = 51/16/15  PA = 57/34 (43)  PCW = 26  Fick cardiac output/index = 4.2/1.8  Thermo CO/CI = 3.9/1.7  PVR = 4.0 Woods  O2 sat = 97%  PA sat = 57%, 58%  Ao Pressure (non-invasive): 116/80 (93)  SVR = 1504   ECHO 03/22/12 EF 15%   04/26/12 S/P Successful DC-CV for atrial fibrillation  04/15/12 Creatinine 2.3 Potassium 4.4 04/22/12 Creatinine 2.55 06/03/12 Creatinine 3.26 Potassium 2.8 06/17/12 Creatinine 2.76 Potassium 3.9 07/08/12 Creatinine 3.15 Potassium 3.3 07/15/12 Creatinine 3.21  Potassium 3.6 07/22/12 Creatinine 2.6 Potassium 3.5   He returns for follow up with his girlfriend. Evaluated last week for an urgent work in and was noted to be in  A flutter. Amiodarone was increased to 400 mg twice a day. Was schedulued for DC-CV 07/17/12  but later cancelled because he was back in NSR. Complains of fatigue and abdominal bloating. SOB with exertion. + Orthopnea. No shocks. Compliant with medications. Weight at home trending up 227-229 pounds. He continues on Milrinone at 0.375 mcg. Followed by [facility] for home Milrinone.   He is not on an ace inhibitor/spironolactone due to elevated creatinine. Not on nitrates due to Viagra use.   ROS: All systems  negative except as listed in HPI, PMH and Problem List.  Past Medical History  Diagnosis Date  . CHF (congestive heart failure)   . Sarcoidosis   . Cardiomyopathy, dilated, nonischemic     non ischemic by cath  . Acute on chronic systolic heart failure   . Automatic implantable cardiac defibrillator in situ   . Atrial fibrillation   . NSVT (nonsustained ventricular tachycardia)   . GERD (gastroesophageal reflux disease)   . Hypercholesteremia   . Myocardial infarction   . Shortness of breath   . Chronic kidney disease (CKD), stage III (moderate)   . Pacemaker   . Anginal pain   . Gout   . Hypertension             Current Outpatient Prescriptions  Medication Sig Dispense Refill  . allopurinol (ZYLOPRIM) 100 MG tablet Take 1 tablet (100 mg total) by mouth daily.  30 tablet  6  . amiodarone (PACERONE) 400 MG tablet Take 1 tablet (400 mg total) by mouth 2 (two) times daily.  60 tablet  6  . apixaban (ELIQUIS) 5 MG TABS tablet Take 0.5 tablets (2.5 mg total) by mouth 2 (two) times daily.  60 tablet  3  . carvedilol (COREG) 3.125 MG tablet Take 1 tablet (3.125 mg total) by mouth 2 (two) times daily with a meal.  60 tablet  2  . esomeprazole (NEXIUM) 40 MG capsule Take 40 mg  by mouth daily as needed (for acid reflux).       . LORazepam (ATIVAN) 1 MG tablet Take 0.5 tablets (0.5 mg total) by mouth 2 (two) times daily as needed for anxiety.  15 tablet  0  . metolazone (ZAROXOLYN) 2.5 MG tablet Take 1 tablet (2.5 mg total) by mouth See admin instructions. Patient takes as directed by MD for fluid retention  8 tablet  3  . milrinone (PRIMACOR) 20 MG/100ML SOLN infusion Inject 37.3125 mcg/min into the vein continuous.  100 mL    . ondansetron (ZOFRAN-ODT) 8 MG disintegrating tablet Take 8 mg by mouth every 8 (eight) hours as needed for nausea. 8mg  ODT q4 hours prn nausea      . rosuvastatin (CRESTOR) 5 MG tablet Take 1 tablet (5 mg total) by mouth at bedtime.      . sildenafil (VIAGRA) 100  MG tablet Take 1 tablet (100 mg total) by mouth daily as needed for erectile dysfunction.  4 tablet  1  . torsemide (DEMADEX) 20 MG tablet Take 2 tablets (40 mg total) by mouth daily.  60 tablet  3   No current facility-administered medications for this encounter.     PHYSICAL EXAM: Filed Vitals:   07/24/12 1416  BP: 100/74  Pulse: 103  Weight: 231 lb 12.8 oz (105.144 kg)  SpO2: 95%     General: Chronically ill appearing. NAD Girlfriend present  HEENT: normal  Neck: supple. JVP difficult to assess but appears elevated. Carotids 2+ bilat; no bruits. No lymphadenopathy or thryomegaly appreciated.  Cor: PMI nondisplaced. Tachycardic and regular S1S2 with +S3 and 2/6 HSM apex.  Lungs: clear Abdomen: soft, nontender, obese, mildly distended, No bruits or masses. Good bowel sounds.  Extremities: no cyanosis, clubbing, rash, no lower extremity edema RUE PICC Neuro: alert & orientedx3, cranial nerves grossly intact. moves all 4 extremities w/o difficulty. Affect pleasant    ASSESSMENT & PLAN:   1) Chronic systolic HF: Nonischemic cardiomyopathy with EF 15% on last echo.  NYHA IV symptoms. Volume status elevated today. Instructed to 2.5 mg Metolazone and continue current dose of torsemide. He will get BMET next week at [facility] and fax results.  Continue milrinone at 0.375 mcg.  Can continue low dose Coreg for now to help with rate control. Lengthy discussion with [name] and his fiance regarding his current condition. He is not ready to transition to Hospice but will think about over the next couple of week. They understand he is not a candidate for advanced therapies due to renal failure and he will remain on Milrinone for the rest of his life. Follow up in 2-3 weeks.  2) Aflutter: Back in NSR. Continue amiodarone at 400 mg twice a day. Check TSH/LFTs with next set of labs when he returns to [place].   3) CKD: Cr down to 2.6.  Suspect cardiorenal component despite milrinone.     CLEGG,AMY 07/24/2012 NP-C

## 2012-07-25 ENCOUNTER — Encounter: Payer: Self-pay | Admitting: Internal Medicine

## 2012-07-31 ENCOUNTER — Other Ambulatory Visit: Payer: Self-pay | Admitting: Internal Medicine

## 2012-07-31 ENCOUNTER — Telehealth (HOSPITAL_COMMUNITY): Payer: Self-pay | Admitting: *Deleted

## 2012-07-31 NOTE — Telephone Encounter (Signed)
Received tc from [facility] with [facility].  They have not heard from pt about his weights this week.  She will go ahead and send in a refill for his Milrinone

## 2012-07-31 NOTE — Progress Notes (Addendum)
Received phone call from Crystal at [facility] re: critical lab result for patient with a K+ 2.6. Attempted to call patient's home phone number and received no answer. Could not leave a message. Per Crystal, Dr. Jens Som was the ordering physician. Will route result to him.   Lab ordered in CHF clinic; have discussed with Avie Arenas and will forward to CHF clinic. Olga Millers

## 2012-08-01 NOTE — Patient Instructions (Signed)
Was able to locate a phone number to contact pt at [phone] - left message for pt to call back ASAP to discuss blood work

## 2012-08-02 IMAGING — CR DG CHEST 1V PORT
1 series · 1 of 1 positions shown · non-contrast
Comparison: 01/14/2010.

CLINICAL DATA: Short of breath.  CHF.  Sarcoidosis.

PORTABLE CHEST - 1 VIEW

[AP]
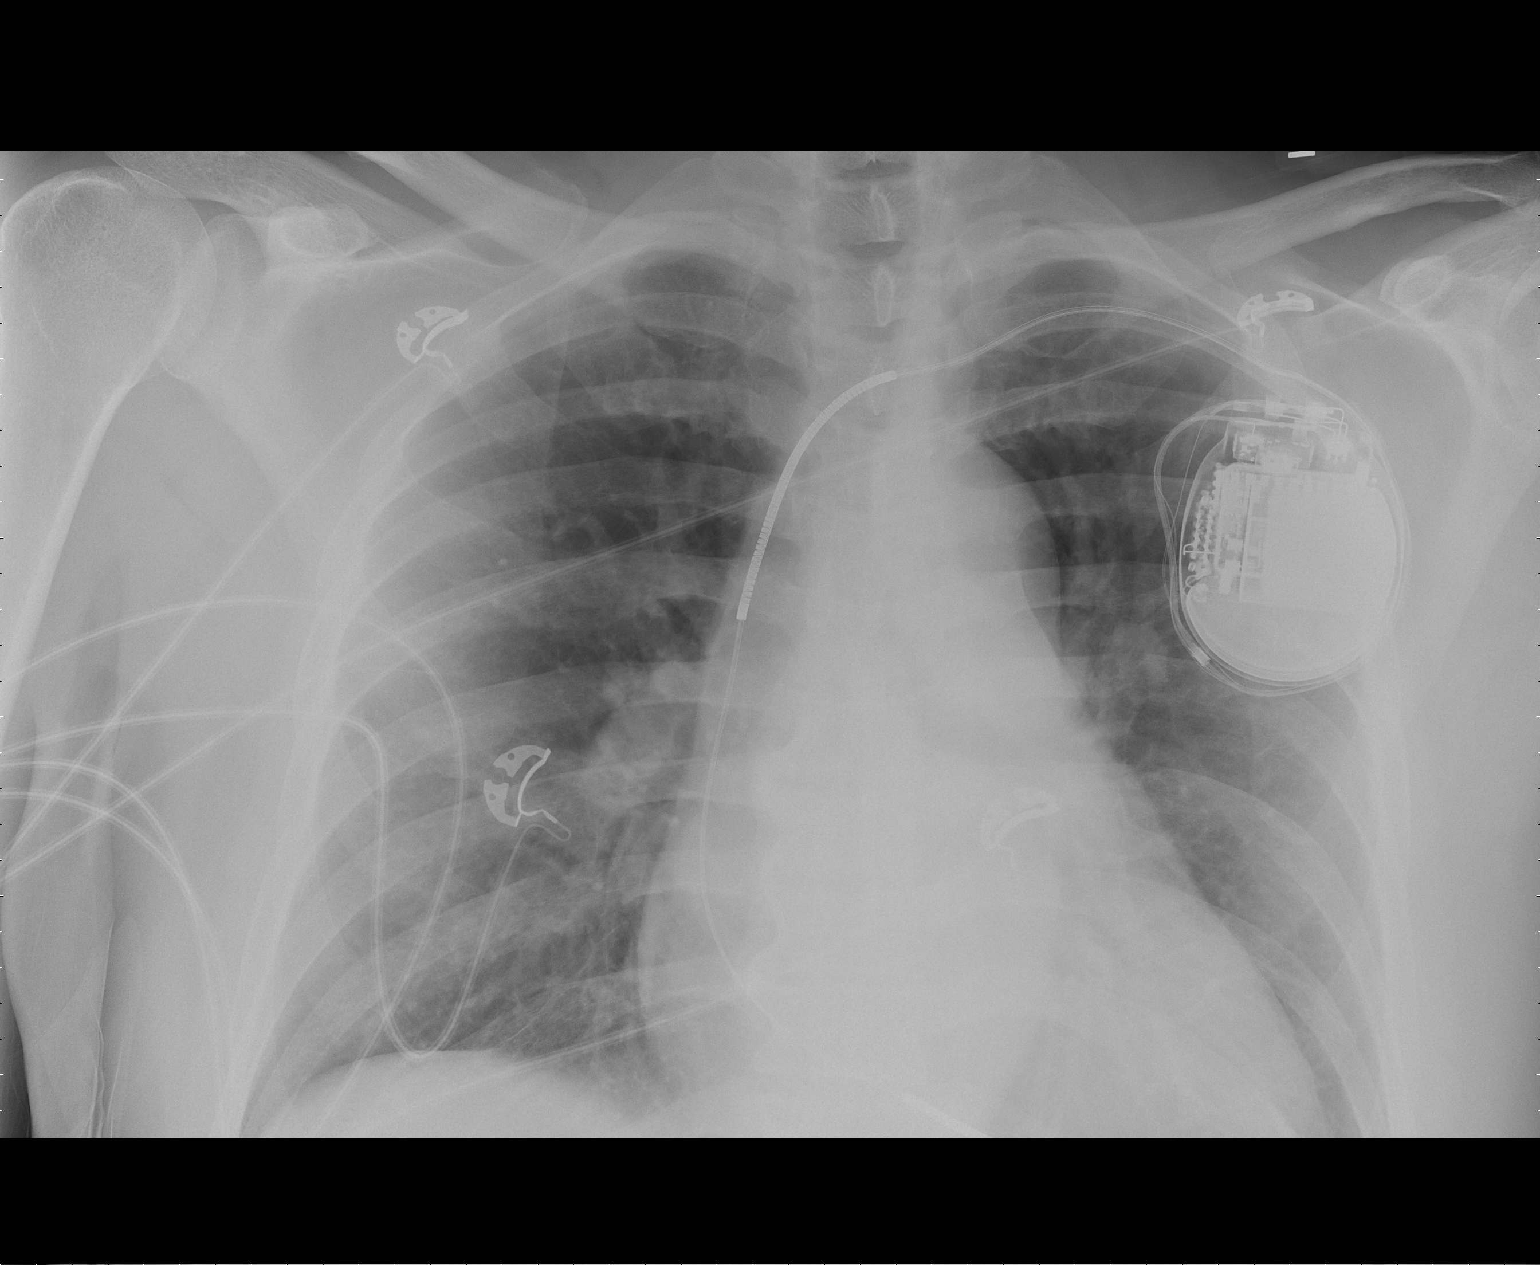

[1 of 1 positions shown; findings below may reference images not displayed]

FINDINGS: Left subclavian AICD.  Cardiopericardial silhouette upper
limits of normal for projection.  Pulmonary vascular congestion.
Stable appearance of the pulmonary hila, mildly enlarged.  Basilar
atelectasis.  No airspace disease or effusion.
IMPRESSION: Borderline cardiomegaly with pulmonary vascular congestion.

## 2012-08-05 ENCOUNTER — Telehealth (HOSPITAL_COMMUNITY): Payer: Self-pay | Admitting: Cardiology

## 2012-08-05 ENCOUNTER — Encounter: Payer: Self-pay | Admitting: Internal Medicine

## 2012-08-05 MED ORDER — POTASSIUM CHLORIDE CRYS ER 20 MEQ PO TBCR: 20.0000 meq | EXTENDED_RELEASE_TABLET | ORAL | Status: DC

## 2012-08-05 NOTE — Telephone Encounter (Signed)
Pt did not leave call back number and he is out of town, attempted to call home number but no answere

## 2012-08-05 NOTE — Telephone Encounter (Signed)
Pt called to review lab results. During phone call pt c/o burning CP and heaviness x 2 days. States he hade CP in the past but this is a different pain Pt denies SOB however he does have dizziness.

## 2012-08-05 NOTE — Telephone Encounter (Signed)
Pt finally called back with his number ([phone]) he states he has been feeling bad past 2 days, like heart is fluttering again, he states he has started feeling better today, he is still out of town and isn't returning until Sat advised could work him in if he comes home or if worse could go to ER there, he states he is feeling better and will keep his appt as sch on 8/13.  Also received pt's labs from 7/30 bun 92 Cr 3.8 K 2.6 per Dr Gala Romney have pt take 80 meq of KCL today pt aware and agreeable, rx sent [facility]

## 2012-08-09 ENCOUNTER — Telehealth (HOSPITAL_COMMUNITY): Payer: Self-pay | Admitting: Cardiology

## 2012-08-09 NOTE — Telephone Encounter (Signed)
Pt called to request appt on 08/12/12 for chest pains, increased flutter in chest, and SOB States he will return from out of town in 08/11/12  Multiple attempts were made to contact pt regarding low potassium level  Per VO Tonye Becket, NP Pt advise to go to nearest ER for further evaluation

## 2012-08-14 ENCOUNTER — Encounter (HOSPITAL_COMMUNITY): Payer: PRIVATE HEALTH INSURANCE

## 2012-08-15 ENCOUNTER — Ambulatory Visit (HOSPITAL_COMMUNITY)
Admission: RE | Admit: 2012-08-15 | Discharge: 2012-08-15 | Disposition: A | Discharge status: Home / Self Care | Payer: PRIVATE HEALTH INSURANCE | Source: Ambulatory Visit | Attending: Internal Medicine | Admitting: Internal Medicine

## 2012-08-15 ENCOUNTER — Telehealth (HOSPITAL_COMMUNITY): Payer: Self-pay | Admitting: Anesthesiology

## 2012-08-15 VITALS — BP 86/74 | HR 84 | Wt 231.4 lb

## 2012-08-15 DIAGNOSIS — I509 Heart failure, unspecified: Secondary | ICD-10-CM | POA: Insufficient documentation

## 2012-08-15 DIAGNOSIS — Z79899 Other long term (current) drug therapy: Secondary | ICD-10-CM | POA: Insufficient documentation

## 2012-08-15 DIAGNOSIS — Z950 Presence of cardiac pacemaker: Secondary | ICD-10-CM | POA: Insufficient documentation

## 2012-08-15 DIAGNOSIS — I130 Hypertensive heart and chronic kidney disease with heart failure and stage 1 through stage 4 chronic kidney disease, or unspecified chronic kidney disease: Secondary | ICD-10-CM | POA: Insufficient documentation

## 2012-08-15 DIAGNOSIS — I428 Other cardiomyopathies: Secondary | ICD-10-CM | POA: Insufficient documentation

## 2012-08-15 DIAGNOSIS — I2510 Atherosclerotic heart disease of native coronary artery without angina pectoris: Secondary | ICD-10-CM | POA: Insufficient documentation

## 2012-08-15 DIAGNOSIS — E7800 Pure hypercholesterolemia, unspecified: Secondary | ICD-10-CM | POA: Insufficient documentation

## 2012-08-15 DIAGNOSIS — N189 Chronic kidney disease, unspecified: Secondary | ICD-10-CM

## 2012-08-15 DIAGNOSIS — D869 Sarcoidosis, unspecified: Secondary | ICD-10-CM | POA: Insufficient documentation

## 2012-08-15 DIAGNOSIS — K219 Gastro-esophageal reflux disease without esophagitis: Secondary | ICD-10-CM | POA: Insufficient documentation

## 2012-08-15 DIAGNOSIS — N183 Chronic kidney disease, stage 3 unspecified: Secondary | ICD-10-CM | POA: Insufficient documentation

## 2012-08-15 DIAGNOSIS — I4892 Unspecified atrial flutter: Secondary | ICD-10-CM | POA: Insufficient documentation

## 2012-08-15 DIAGNOSIS — M109 Gout, unspecified: Secondary | ICD-10-CM | POA: Insufficient documentation

## 2012-08-15 DIAGNOSIS — I5022 Chronic systolic (congestive) heart failure: Secondary | ICD-10-CM | POA: Insufficient documentation

## 2012-08-15 DIAGNOSIS — I252 Old myocardial infarction: Secondary | ICD-10-CM | POA: Insufficient documentation

## 2012-08-15 LAB — BASIC METABOLIC PANEL
BUN: 103 mg/dL — ABNORMAL HIGH (ref 6–23)
Chloride: 92 mEq/L — ABNORMAL LOW (ref 96–112)
GFR calc non Af Amer: 14 mL/min — ABNORMAL LOW (ref >90)
Glucose, Bld: 203 mg/dL — ABNORMAL HIGH (ref 70–99)
Potassium: 3.3 mEq/L — ABNORMAL LOW (ref 3.5–5.1)

## 2012-08-15 MED ORDER — AZITHROMYCIN 250 MG PO TABS: ORAL_TABLET | ORAL | Status: DC

## 2012-08-15 NOTE — Patient Instructions (Addendum)
Hold torsemide for 2 days.   Take extra 40 meq K+ today.

## 2012-08-15 NOTE — Telephone Encounter (Signed)
Talked to patient about his labs. Cr up today to 4.2. Will hold diuretics for 2 days. K+ 3.2 instructed patient to taek 40 meq KCL toda. Patient states he understands.

## 2012-08-15 NOTE — Progress Notes (Signed)
Patient ID: [name], male   DOB: 08-16-1950, 62 y.o.   MRN: [id number]  Weight Range    219-221 pounds  Baseline proBNP    511 on 11/13/11   HPI: [name] is a 62 y.o. gentlemen with severe HF due to NICM (EF 20-25%) with multiple hospital admissions for HF exacerbations. [name] also has history of ventricular tachycardia s/p St Jude ICD by [name], CKD: stage IV, baseline Cr ~2, and paroxysmal atrial arrhythmias on amiodarone as well as sarcoidosis and hypertension. Cath in 2008 by Dr. [name] showed no CAD.Seen at [facility] and not felt to be a transplant candidate due to renal failure and lack of family support.  Milrinone initiated in January 2014 for low output. 03/01/12 milrinone increased 0.318mc/kg/min.   RHC 01/26/12  RA = 14  RV = 51/16/15  PA = 57/34 (43)  PCW = 26  Fick cardiac output/index = 4.2/1.8  Thermo CO/CI = 3.9/1.7  PVR = 4.0 Woods  O2 sat = 97%  PA sat = 57%, 58%  Ao Pressure (non-invasive): 116/80 (93)  SVR = 1504   ECHO 03/22/12 EF 15%   04/26/12 S/P Successful DC-CV for atrial fibrillation  04/15/12 Creatinine 2.3 Potassium 4.4 04/22/12 Creatinine 2.55 06/03/12 Creatinine 3.26 Potassium 2.8 06/17/12 Creatinine 2.76 Potassium 3.9 07/08/12 Creatinine 3.15 Potassium 3.3 07/15/12 Creatinine 3.21  Potassium 3.6 07/22/12 Creatinine 2.6 Potassium 3.5  08/12/12 Creatinine not correct 1.10, Potassium 3.1?   Follow up: [place] beach [place] 2[place]3 [place] with fiance. Not feeling well for a week. Reports that [name] heart has been fluttering and [name] is SOB and dizzy. +orthopnea, sleeping in a recliner. Compliant with medications. Weight at home trending up 224-226 lbs. [name] continues on Milrinone at 0.375 mcg. Followed by [facility] for home Milrinone.   [name] is not on an ace inhibitor/spironolactone due to elevated creatinine. Not on nitrates due to Viagra use.   ROS: All systems negative except as listed in HPI, PMH and Problem List.  Past Medical History  Diagnosis Date  .  CHF (congestive heart failure)   . Sarcoidosis   . Cardiomyopathy, dilated, nonischemic     non ischemic by cath  . Acute on chronic systolic heart failure   . Automatic implantable cardiac defibrillator in situ   . Atrial fibrillation   . NSVT (nonsustained ventricular tachycardia)   . GERD (gastroesophageal reflux disease)   . Hypercholesteremia   . Myocardial infarction   . Shortness of breath   . Chronic kidney disease (CKD), stage III (moderate)   . Pacemaker   . Anginal pain   . Gout   . Hypertension     [name]  . Coronary artery disease    Current Outpatient Prescriptions  Medication Sig Dispense Refill  . allopurinol (ZYLOPRIM) 100 MG tablet Take 1 tablet (100 mg total) by mouth daily.  30 tablet  6  . amiodarone (PACERONE) 400 MG tablet Take 1 tablet (400 mg total) by mouth 2 (two) times daily.  60 tablet  6  . apixaban (ELIQUIS) 5 MG TABS tablet Take 0.5 tablets (2.5 mg total) by mouth 2 (two) times daily.  60 tablet  3  . carvedilol (COREG) 3.125 MG tablet Take 1 tablet (3.125 mg total) by mouth 2 (two) times daily with a meal.  60 tablet  2  . esomeprazole (NEXIUM) 40 MG capsule Take 40 mg by mouth daily as needed (for acid reflux).       . LORazepam (ATIVAN) 1  MG tablet Take 0.5 tablets (0.5 mg total) by mouth 2 (two) times daily as needed for anxiety.  15 tablet  0  . metolazone (ZAROXOLYN) 2.5 MG tablet Take 1 tablet (2.5 mg total) by mouth See admin instructions. Patient takes as directed by MD for fluid retention  10 tablet  3  . milrinone (PRIMACOR) 20 MG/100ML SOLN infusion Inject 37.3125 mcg/min into the vein continuous.  100 mL    . ondansetron (ZOFRAN-ODT) 8 MG disintegrating tablet Take 8 mg by mouth every 8 (eight) hours as needed for nausea. 8mg  ODT q4 hours prn nausea      . potassium chloride SA (K-DUR,KLOR-CON) 20 MEQ tablet Take 1 tablet (20 mEq total) by mouth as directed.  30 tablet  1  . rosuvastatin (CRESTOR) 5 MG tablet Take 1 tablet (5 mg  total) by mouth at bedtime.      . sildenafil (VIAGRA) 100 MG tablet Take 1 tablet (100 mg total) by mouth daily as needed for erectile dysfunction.  4 tablet  1  . torsemide (DEMADEX) 20 MG tablet Take 2 tablets (40 mg total) by mouth daily.  60 tablet  3   No current facility-administered medications for this encounter.   PHYSICAL EXAM: Filed Vitals:   08/15/12 0912  BP: 86/74  Pulse: 84  Weight: 231 lb 6.4 oz (104.962 kg)  SpO2: 99%   General: Chronically ill appearing. NAD Girlfriend present  HEENT: normal  Neck: supple. JVP 6-7. Carotids 2+ bilat; no bruits. No lymphadenopathy or thryomegaly appreciated.  Cor: PMI nondisplaced. regular S1S2 with +S3 and 2/6 HSM apex.  Lungs: clear Abdomen: soft, nontender, obese, mildly distended, No bruits or masses. Good bowel sounds.  Extremities: no cyanosis, clubbing, rash, no lower extremity edema RUE PICC Neuro: alert & orientedx3, cranial nerves grossly intact. moves all 4 extremities w/o difficulty. Affect pleasant   ASSESSMENT & PLAN:   1) Chronic systolic HF: Nonischemic cardiomyopathy with EF 15% on last echo.  - NYHA IIIb/IV symptoms. Weight is stable, but appears to be mildly elevated. [name] reports feeling better now that [name] heart is not fluttering. BMET came back and Cr increased to 4.24. Difficult situation hard to assess whether Cr is elevated d/t decreased perfusion or diuretic usage. Will hold diuretics for 2 days and then go back to 40 mg daily. K+ 3.3 take 40 meq potassium today. - Continue milrinone at 0.375 mcg. - [name] remains reluctant to get Hospice involved however they have met with him to discuss options. [name] wants leave the ICD currently. - 2) Aflutter:  - Appears to be in NSR this am. Will continue Amiodarone at 400 mg BID. Take extra 40 meq K+ today    3) CKD:  - elevated 4.24 with cardiorenal syndrome.  - hold diuretics for 2 days.   [name] B 08/15/2012 NP-C

## 2012-08-19 ENCOUNTER — Telehealth (HOSPITAL_COMMUNITY): Payer: Self-pay | Admitting: Cardiology

## 2012-08-19 NOTE — Telephone Encounter (Signed)
Pt's wt up 10 lbs, he states Abd is large and he is SOB, he states he held Torsemide for 2 days as instructed and wt started increasing, he increased Torsemide to 60 mg daily and took Metolazone yesterday but wt still increased today, he will take metolazone now and see [place] in the AM

## 2012-08-19 NOTE — Telephone Encounter (Signed)
Scott called to report pts weight has increased x 10 lbs. (234 lbs)  110/79 HR 68 O2 sat 97% RA Pt did not take torsemide x 2 days and increased potassium x 2 days as instructed by the HF team. He also did take Metolazone x 1 dose Labs were drawn today at visit. Pt does have SOB

## 2012-08-20 ENCOUNTER — Ambulatory Visit (HOSPITAL_COMMUNITY)
Admission: RE | Admit: 2012-08-20 | Discharge: 2012-08-20 | Disposition: A | Discharge status: Home / Self Care | Payer: PRIVATE HEALTH INSURANCE | Source: Ambulatory Visit | Attending: Internal Medicine | Admitting: Internal Medicine

## 2012-08-20 VITALS — BP 90/1 | HR 102 | Wt 234.0 lb

## 2012-08-20 DIAGNOSIS — K219 Gastro-esophageal reflux disease without esophagitis: Secondary | ICD-10-CM | POA: Insufficient documentation

## 2012-08-20 DIAGNOSIS — I4892 Unspecified atrial flutter: Secondary | ICD-10-CM | POA: Insufficient documentation

## 2012-08-20 DIAGNOSIS — I5022 Chronic systolic (congestive) heart failure: Secondary | ICD-10-CM

## 2012-08-20 DIAGNOSIS — M109 Gout, unspecified: Secondary | ICD-10-CM | POA: Insufficient documentation

## 2012-08-20 DIAGNOSIS — Z95810 Presence of automatic (implantable) cardiac defibrillator: Secondary | ICD-10-CM | POA: Insufficient documentation

## 2012-08-20 DIAGNOSIS — I509 Heart failure, unspecified: Secondary | ICD-10-CM | POA: Insufficient documentation

## 2012-08-20 DIAGNOSIS — R42 Dizziness and giddiness: Secondary | ICD-10-CM | POA: Insufficient documentation

## 2012-08-20 DIAGNOSIS — R3911 Hesitancy of micturition: Secondary | ICD-10-CM | POA: Insufficient documentation

## 2012-08-20 DIAGNOSIS — E7800 Pure hypercholesterolemia, unspecified: Secondary | ICD-10-CM | POA: Insufficient documentation

## 2012-08-20 DIAGNOSIS — N183 Chronic kidney disease, stage 3 unspecified: Secondary | ICD-10-CM | POA: Insufficient documentation

## 2012-08-20 DIAGNOSIS — I428 Other cardiomyopathies: Secondary | ICD-10-CM | POA: Insufficient documentation

## 2012-08-20 DIAGNOSIS — N189 Chronic kidney disease, unspecified: Secondary | ICD-10-CM

## 2012-08-20 DIAGNOSIS — I252 Old myocardial infarction: Secondary | ICD-10-CM | POA: Insufficient documentation

## 2012-08-20 DIAGNOSIS — Z79899 Other long term (current) drug therapy: Secondary | ICD-10-CM | POA: Insufficient documentation

## 2012-08-20 DIAGNOSIS — I2510 Atherosclerotic heart disease of native coronary artery without angina pectoris: Secondary | ICD-10-CM | POA: Insufficient documentation

## 2012-08-20 NOTE — Progress Notes (Signed)
Patient ID: [name], male   DOB: Jul 07, 1950, 62 y.o.   MRN: [id number]   Weight Range    219-221 pounds  Baseline proBNP    511 on 11/13/11   HPI: [name] is a 62 y.o. gentlemen with severe HF due to NICM (EF 20-25%) with multiple hospital admissions for HF exacerbations. He also has history of ventricular tachycardia s/p St Jude ICD by [name], CKD: stage IV, baseline Cr ~2, and paroxysmal atrial arrhythmias on amiodarone as well as sarcoidosis and hypertension. Cath in 2008 by Dr. [name] showed no CAD.Seen at [facility] and not felt to be a transplant candidate due to renal failure and lack of family support.  Milrinone initiated in January 2014 for low output. 03/01/12 milrinone increased 0.38mc/kg/min.   RHC 01/26/12  RA = 14  RV = 51/16/15  PA = 57/34 (43)  PCW = 26  Fick cardiac output/index = 4.2/1.8  Thermo CO/CI = 3.9/1.7  PVR = 4.0 Woods  O2 sat = 97%  PA sat = 57%, 58%  Ao Pressure (non-invasive): 116/80 (93)  SVR = 1504   ECHO 03/22/12 EF 15%   04/26/12 S/P Successful DC-CV for atrial fibrillation  04/15/12 Creatinine 2.3 Potassium 4.4 04/22/12 Creatinine 2.55 06/03/12 Creatinine 3.26 Potassium 2.8 06/17/12 Creatinine 2.76 Potassium 3.9 07/08/12 Creatinine 3.15 Potassium 3.3 07/15/12 Creatinine 3.21  Potassium 3.6 07/22/12 Creatinine 2.6 Potassium 3.5  08/12/12 Creatinine not correct 1.10, Potassium 3.1?  08/15/12 Creatinine 4.24 Potassium 3.3   He returns for follow up. Last week he was instructed to hold diuretics for 2 days due to elevated creatinine. Complains of fatigue and dyspnea. Complains of dizziness. Weight at home trending up  229 to 234 pounds. Taking metolazone every Monday and Friday.  He continues on Milrinone at 0.375 mcg. Followed by [facility] for home Milrinone. Complaining of urinary hesitancy.   He is not on an ace inhibitor/spironolactone due to elevated creatinine. Not on nitrates due to Viagra use.   ROS: All systems negative except as listed in  HPI, PMH and Problem List.  Past Medical History  Diagnosis Date  . CHF (congestive heart failure)   . Sarcoidosis   . Cardiomyopathy, dilated, nonischemic     non ischemic by cath  . Acute on chronic systolic heart failure   . Automatic implantable cardiac defibrillator in situ   . Atrial fibrillation   . NSVT (nonsustained ventricular tachycardia)   . GERD (gastroesophageal reflux disease)   . Hypercholesteremia   . Myocardial infarction   . Shortness of breath   . Chronic kidney disease (CKD), stage III (moderate)   . Pacemaker   . Anginal pain   . Gout   . Hypertension     [name]  . Coronary artery disease    Current Outpatient Prescriptions  Medication Sig Dispense Refill  . allopurinol (ZYLOPRIM) 100 MG tablet Take 1 tablet (100 mg total) by mouth daily.  30 tablet  6  . amiodarone (PACERONE) 400 MG tablet Take 1 tablet (400 mg total) by mouth 2 (two) times daily.  60 tablet  6  . apixaban (ELIQUIS) 5 MG TABS tablet Take 0.5 tablets (2.5 mg total) by mouth 2 (two) times daily.  60 tablet  3  . carvedilol (COREG) 3.125 MG tablet Take 1 tablet (3.125 mg total) by mouth 2 (two) times daily with a meal.  60 tablet  2  . esomeprazole (NEXIUM) 40 MG capsule Take 40 mg by mouth daily as needed (for acid reflux).       Marland Kitchen  LORazepam (ATIVAN) 1 MG tablet Take 0.5 tablets (0.5 mg total) by mouth 2 (two) times daily as needed for anxiety.  15 tablet  0  . metolazone (ZAROXOLYN) 2.5 MG tablet Take 1 tablet (2.5 mg total) by mouth See admin instructions. Patient takes as directed by MD for fluid retention  10 tablet  3  . milrinone (PRIMACOR) 20 MG/100ML SOLN infusion Inject 37.3125 mcg/min into the vein continuous.  100 mL    . ondansetron (ZOFRAN-ODT) 8 MG disintegrating tablet Take 8 mg by mouth every 8 (eight) hours as needed for nausea. 8mg  ODT q4 hours prn nausea      . potassium chloride SA (K-DUR,KLOR-CON) 20 MEQ tablet Take 1 tablet (20 mEq total) by mouth as directed.  30  tablet  1  . rosuvastatin (CRESTOR) 5 MG tablet Take 1 tablet (5 mg total) by mouth at bedtime.      . sildenafil (VIAGRA) 100 MG tablet Take 1 tablet (100 mg total) by mouth daily as needed for erectile dysfunction.  4 tablet  1  . torsemide (DEMADEX) 20 MG tablet Take 60 mg by mouth daily.       No current facility-administered medications for this encounter.   PHYSICAL EXAM: Filed Vitals:   08/20/12 0929  BP: 90/1  Pulse: 102  Weight: 234 lb (106.142 kg)  SpO2: 99%   General: Chronically ill appearing. NAD   HEENT: normal  Neck: supple. JVP 9-10. Carotids 2+ bilat; no bruits. No lymphadenopathy or thryomegaly appreciated.  Cor: PMI nondisplaced. regular S1S2 with +S3 and 2/6 HSM apex.  Lungs: clear Abdomen: soft, nontender, obese, distended, No bruits or masses. Good bowel sounds.  Extremities: no cyanosis, clubbing, rash, no lower extremity edema RUE PICC Neuro: alert & orientedx3, cranial nerves grossly intact. moves all 4 extremities w/o difficulty. Affect pleasant   EKG: NSR 88 bpm  ASSESSMENT & PLAN:   1) Chronic systolic HF: Nonischemic cardiomyopathy with EF 15% on last echo.  - NYHA IIId/IV symptoms. He is not a candidate for advanced therapies due to CKD.  - Volume status elevated (he did not take torsemide this am) Continue torsemide 60 mg daily.  -Due to dizziness  And hypotension will stop carvedilol - Continue milrinone at 0.375 mcg. Continue [facility] for home milrinone and picc.  - He is not on ace/arb/spiro due to CKD.  - He declines Hospice. He has met with Hospice. ICD remains on.   2) Aflutter:  - Maintaining SR.  Rate controlled. Will continue Amiodarone at 400 mg BID. Continue apixaban 2.5 mg twice a day.     3) CKD:  - BMET results pending.   4) Urinary Hesitancy Refer to urology.   CLEGG,AMY 08/20/2012 NP-C

## 2012-08-20 NOTE — Patient Instructions (Addendum)
Follow up in 2 weeks  Do the following things EVERYDAY: 1) Weigh yourself in the morning before breakfast. Write it down and keep it in a log. 2) Take your medicines as prescribed 3) Eat low salt foods--Limit salt (sodium) to 2000 mg per day.  4) Stay as active as you can everyday 5) Limit all fluids for the day to less than 2 liters 

## 2012-08-23 NOTE — Addendum Note (Signed)
Encounter addended by: Alysia M Roberts on: 08/23/2012 12:32 PM<BR>     Documentation filed: Charges VN

## 2012-08-26 ENCOUNTER — Encounter: Payer: Self-pay | Admitting: Internal Medicine

## 2012-08-27 ENCOUNTER — Telehealth (HOSPITAL_COMMUNITY): Payer: Self-pay | Admitting: Anesthesiology

## 2012-08-27 MED ORDER — SPIRONOLACTONE 25 MG PO TABS: 12.5000 mg | ORAL_TABLET | Freq: Every day | ORAL | Status: DC

## 2012-08-27 NOTE — Telephone Encounter (Signed)
Called patient about labs. K+ 2.8 and Cr. 3.34. Told patient to take 40 meq extra of potassium today and then start taking 12.5 mg Spironolactone daily. Patient already has [name] at home and will start taking today. Resent prescription as well

## 2012-09-04 ENCOUNTER — Telehealth (HOSPITAL_COMMUNITY): Payer: Self-pay | Admitting: Adult Health

## 2012-09-04 ENCOUNTER — Encounter (HOSPITAL_COMMUNITY): Payer: Self-pay

## 2012-09-04 ENCOUNTER — Ambulatory Visit (HOSPITAL_COMMUNITY)
Admission: RE | Admit: 2012-09-04 | Discharge: 2012-09-04 | Disposition: A | Discharge status: Home / Self Care | Payer: PRIVATE HEALTH INSURANCE | Source: Ambulatory Visit | Attending: Internal Medicine | Admitting: Internal Medicine

## 2012-09-04 VITALS — BP 86/64 | HR 120 | Wt 233.8 lb

## 2012-09-04 DIAGNOSIS — I509 Heart failure, unspecified: Secondary | ICD-10-CM

## 2012-09-04 DIAGNOSIS — I428 Other cardiomyopathies: Secondary | ICD-10-CM | POA: Insufficient documentation

## 2012-09-04 DIAGNOSIS — K219 Gastro-esophageal reflux disease without esophagitis: Secondary | ICD-10-CM | POA: Insufficient documentation

## 2012-09-04 DIAGNOSIS — Z950 Presence of cardiac pacemaker: Secondary | ICD-10-CM | POA: Insufficient documentation

## 2012-09-04 DIAGNOSIS — I129 Hypertensive chronic kidney disease with stage 1 through stage 4 chronic kidney disease, or unspecified chronic kidney disease: Secondary | ICD-10-CM | POA: Insufficient documentation

## 2012-09-04 DIAGNOSIS — I4892 Unspecified atrial flutter: Secondary | ICD-10-CM

## 2012-09-04 DIAGNOSIS — N184 Chronic kidney disease, stage 4 (severe): Secondary | ICD-10-CM | POA: Insufficient documentation

## 2012-09-04 DIAGNOSIS — N189 Chronic kidney disease, unspecified: Secondary | ICD-10-CM

## 2012-09-04 DIAGNOSIS — R3911 Hesitancy of micturition: Secondary | ICD-10-CM | POA: Insufficient documentation

## 2012-09-04 DIAGNOSIS — I4891 Unspecified atrial fibrillation: Secondary | ICD-10-CM

## 2012-09-04 DIAGNOSIS — I5022 Chronic systolic (congestive) heart failure: Secondary | ICD-10-CM | POA: Insufficient documentation

## 2012-09-04 LAB — BASIC METABOLIC PANEL
BUN: 98 mg/dL — ABNORMAL HIGH (ref 6–23)
CO2: 28 mEq/L (ref 19–32)
Chloride: 90 mEq/L — ABNORMAL LOW (ref 96–112)
Creatinine, Ser: 3.88 mg/dL — ABNORMAL HIGH (ref 0.50–1.35)
Glucose, Bld: 143 mg/dL — ABNORMAL HIGH (ref 70–99)

## 2012-09-04 MED ORDER — POTASSIUM CHLORIDE CRYS ER 20 MEQ PO TBCR: 40.0000 meq | EXTENDED_RELEASE_TABLET | Freq: Two times a day (BID) | ORAL | Status: DC

## 2012-09-04 NOTE — Patient Instructions (Addendum)
Follow up in 1 month  Do the following things EVERYDAY: 1) Weigh yourself in the morning before breakfast. Write it down and keep it in a log. 2) Take your medicines as prescribed 3) Eat low salt foods-Limit salt (sodium) to 2000 mg per day.  4) Stay as active as you can everyday 5) Limit all fluids for the day to less than 2 liters 6)

## 2012-09-04 NOTE — Telephone Encounter (Signed)
Left message to return call regarding lab work    Potassium 2.9.   Will need to take Kdur 40 meq twice a day  Repeat BMET in one week.   CLEGG,AMY 11:02 AM

## 2012-09-04 NOTE — Progress Notes (Signed)
Patient ID: [name], male   DOB: [birth year], 62 y.o.   MRN: [id number]  Weight Range    219-221 pounds  Baseline proBNP    511 on 11/13/11   HPI: [name] is a 62 y.o. gentlemen with severe HF due to NICM (EF 20-25%) with multiple hospital admissions for HF exacerbations. He also has history of ventricular tachycardia s/p [employer] ICD by [name], CKD: stage IV, baseline Cr ~2, and paroxysmal atrial arrhythmias on amiodarone as well as sarcoidosis and hypertension. Cath in 2008 by Dr. [name] showed no CAD.Seen at [facility] and not felt to be a transplant candidate due to renal failure and lack of family support.  Milrinone initiated in January 2014 for low output. 03/01/12 milrinone increased 0.365mc/kg/min.   RHC 01/26/12  RA = 14  RV = 51/16/15  PA = 57/34 (43)  PCW = 26  Fick cardiac output/index = 4.2/1.8  Thermo CO/CI = 3.9/1.7  PVR = 4.0 [place]  O2 sat = 97%  PA sat = 57%, 58%  Ao Pressure (non-invasive): 116/80 (93)  SVR = 1504   ECHO 03/22/12 EF 15%   04/26/12 S/P Successful DC-CV for atrial fibrillation  04/15/12 Creatinine 2.3 Potassium 4.4 04/22/12 Creatinine 2.55 06/03/12 Creatinine 3.26 Potassium 2.8 06/17/12 Creatinine 2.76 Potassium 3.9 07/08/12 Creatinine 3.15 Potassium 3.3 07/15/12 Creatinine 3.21  Potassium 3.6 07/22/12 Creatinine 2.6 Potassium 3.5  08/12/12 Creatinine not correct 1.10, Potassium 3.1?  08/15/12 Creatinine 4.24 Potassium 3.3    He returns for follow up. Last visit carvedilol stopped due to hypotension and dizziness. He restarted 3.125 of carvedilol once a day when his  his BP went back up into the 170s.  Complaining of dyspnea, orthopnea, PND and fatigue. Weight at home 229-231 pounds.  Taking metolazone every Monday and Friday.  He continues on Milrinone at 0.375 mcg. Followed by [facility] for home Milrinone. Complaining of ongoing urinary hesitancy. He has met with Hospice and he declines.   He is not on an ace inhibitor/spironolactone due to elevated  creatinine. Not on nitrates due to Viagra use.   ROS: All systems negative except as listed in HPI, PMH and Problem List.  Past Medical History  Diagnosis Date  . CHF (congestive heart failure)   . Sarcoidosis   . Cardiomyopathy, dilated, nonischemic     non ischemic by cath  . Acute on chronic systolic heart failure   . Automatic implantable cardiac defibrillator in situ   . Atrial fibrillation   . NSVT (nonsustained ventricular tachycardia)   . GERD (gastroesophageal reflux disease)   . Hypercholesteremia   . Myocardial infarction   . Shortness of breath   . Chronic kidney disease (CKD), stage III (moderate)   . Pacemaker   . Anginal pain   . Gout   . Hypertension     [name]  . Coronary artery disease    Current Outpatient Prescriptions  Medication Sig Dispense Refill  . allopurinol (ZYLOPRIM) 100 MG tablet Take 1 tablet (100 mg total) by mouth daily.  30 tablet  6  . amiodarone (PACERONE) 400 MG tablet Take 1 tablet (400 mg total) by mouth 2 (two) times daily.  60 tablet  6  . apixaban (ELIQUIS) 5 MG TABS tablet Take 0.5 tablets (2.5 mg total) by mouth 2 (two) times daily.  60 tablet  3  . carvedilol (COREG) 3.125 MG tablet Take 3.125 mg by mouth 2 (two) times daily with a meal.      . esomeprazole (NEXIUM)  40 MG capsule Take 40 mg by mouth daily as needed (for acid reflux).       . LORazepam (ATIVAN) 1 MG tablet Take 0.5 tablets (0.5 mg total) by mouth 2 (two) times daily as needed for anxiety.  15 tablet  0  . metolazone (ZAROXOLYN) 2.5 MG tablet Take 1 tablet (2.5 mg total) by mouth See admin instructions. Patient takes as directed by MD for fluid retention  10 tablet  3  . milrinone (PRIMACOR) 20 MG/100ML SOLN infusion Inject 37.3125 mcg/min into the vein continuous.  100 mL    . ondansetron (ZOFRAN-ODT) 8 MG disintegrating tablet Take 8 mg by mouth every 8 (eight) hours as needed for nausea. 8mg  ODT q4 hours prn nausea      . potassium chloride SA (K-DUR,KLOR-CON)  20 MEQ tablet Take 1 tablet (20 mEq total) by mouth as directed.  30 tablet  1  . rosuvastatin (CRESTOR) 5 MG tablet Take 1 tablet (5 mg total) by mouth at bedtime.      . sildenafil (VIAGRA) 100 MG tablet Take 1 tablet (100 mg total) by mouth daily as needed for erectile dysfunction.  4 tablet  1  . spironolactone (ALDACTONE) 25 MG tablet Take 0.5 tablets (12.5 mg total) by mouth daily.  45 tablet  3  . torsemide (DEMADEX) 20 MG tablet Take 40 mg in AM and 20 mg in PM       No current facility-administered medications for this encounter.   PHYSICAL EXAM: Filed Vitals:   09/04/12 0904  BP: 86/64  Pulse: 120  Weight: 233 lb 12.8 oz (106.051 kg)  SpO2: 97%   General: Chronically ill appearing. NAD   HEENT: normal  Neck: supple. JVP 10-11. Carotids 2+ bilat; no bruits. No lymphadenopathy or thryomegaly appreciated.  Cor: PMI nondisplaced. regular S1S2 with +S3 and 2/6 HSM apex.  Lungs: clear Abdomen: soft, nontender, obese, distended, No bruits or masses. Good bowel sounds.  Extremities: no cyanosis, clubbing, rash, no lower extremity edema RUE PICC Neuro: alert & orientedx3, cranial nerves grossly intact. moves all 4 extremities w/o difficulty. Affect pleasant    ASSESSMENT & PLAN:   1) Chronic systolic HF: Nonischemic cardiomyopathy with EF 15% on last echo.  - NYHA IIId/IV symptoms. Functionally he continues to decline. He is not a candidate for advanced therapies due to CKD. Evaluated at [facility] and he was not a candidate for advanced therapies due to CKD and lack of social support.  - Volume status elevated (he did not take torsemide this am) Continue torsemide 60 mg daily.  -Continue carvedilol 3.125 mg daily. Carvedilol was stopped at last visit but he restarted 3.125 mg daily. Will continue for now.  - Continue milrinone at 0.375 mcg. Continue [facility] for home milrinone and picc.  - He is not on ace/arb due to CKD.  Cleda Daub started per Dr Gala Romney due to ongoing hypoklemia - Discussed  Hospice again and he is now considering. He has already met with Hospice and will discuss at follow up appointment. He understand when he elects to transition to Hospice ICD will need to turned off.    Check BMET today   2) Aflutter:  - Maintaining SR.  Rate controlled. Will continue Amiodarone at 400 mg BID. Continue apixaban 2.5 mg twice a day.     3) CKD:  - BMET results pending.   4) Urinary Hesitancy Urology consult pending   CLEGG,AMY 09/04/2012 NP-C

## 2012-09-04 NOTE — Telephone Encounter (Signed)
Pt aware of lab results and med adjustments Pt will be out of town next week [place] [facility] [place] [facility] [phone] fax # [phone]) Order faxed to registration and pt will have to check in to have labs drawn next week

## 2012-09-06 ENCOUNTER — Encounter: Payer: Self-pay | Admitting: Internal Medicine

## 2012-09-07 ENCOUNTER — Telehealth: Payer: Self-pay | Admitting: Internal Medicine

## 2012-09-07 DIAGNOSIS — L299 Pruritus, unspecified: Secondary | ICD-10-CM

## 2012-09-07 MED ORDER — HYDROXYZINE HCL 10 MG PO TABS: 10.0000 mg | ORAL_TABLET | Freq: Three times a day (TID) | ORAL | Status: DC | PRN

## 2012-09-07 NOTE — Telephone Encounter (Signed)
I spoke with Stacy at his home care team. He is having a lot of itching around the PICC line site. It is red, but not hot. He isn't having any fevers or chills. There is no drainage. She believes that this is due to the material of the dressing. She is going to change up the material of the dressing. In the interim he is trying benadryl 50mg  PO which is helping a little bit. I will add atarax 10mg  prn to see if that helps as well.

## 2012-09-16 ENCOUNTER — Other Ambulatory Visit (HOSPITAL_COMMUNITY): Payer: Self-pay | Admitting: Internal Medicine

## 2012-09-16 ENCOUNTER — Ambulatory Visit (HOSPITAL_COMMUNITY)
Admission: RE | Admit: 2012-09-16 | Discharge: 2012-09-16 | Disposition: A | Discharge status: Home / Self Care | Payer: PRIVATE HEALTH INSURANCE | Source: Ambulatory Visit | Attending: Internal Medicine | Admitting: Internal Medicine

## 2012-09-16 ENCOUNTER — Telehealth (HOSPITAL_COMMUNITY): Payer: Self-pay | Admitting: Cardiology

## 2012-09-16 ENCOUNTER — Encounter (HOSPITAL_COMMUNITY): Payer: Self-pay

## 2012-09-16 DIAGNOSIS — T80218A Other infection due to central venous catheter, initial encounter: Secondary | ICD-10-CM | POA: Insufficient documentation

## 2012-09-16 DIAGNOSIS — I5022 Chronic systolic (congestive) heart failure: Secondary | ICD-10-CM | POA: Insufficient documentation

## 2012-09-16 DIAGNOSIS — Y849 Medical procedure, unspecified as the cause of abnormal reaction of the patient, or of later complication, without mention of misadventure at the time of the procedure: Secondary | ICD-10-CM | POA: Insufficient documentation

## 2012-09-16 DIAGNOSIS — Z452 Encounter for adjustment and management of vascular access device: Secondary | ICD-10-CM | POA: Insufficient documentation

## 2012-09-16 HISTORY — PX: CENTRAL VENOUS CATHETER TUNNELED INSERTION SINGLE LUMEN: SHX1325

## 2012-09-16 MED ORDER — FENTANYL CITRATE 0.05 MG/ML IJ SOLN
INTRAMUSCULAR | Status: AC
  Filled 2012-09-16: qty 2

## 2012-09-16 MED ORDER — MIDAZOLAM HCL 2 MG/2ML IJ SOLN
INTRAMUSCULAR | Status: AC
  Filled 2012-09-16: qty 4

## 2012-09-16 MED ORDER — FENTANYL CITRATE 0.05 MG/ML IJ SOLN
INTRAMUSCULAR | Status: AC | PRN
  Administered 2012-09-16: 50 ug via INTRAVENOUS

## 2012-09-16 MED ORDER — MIDAZOLAM HCL 2 MG/2ML IJ SOLN
INTRAMUSCULAR | Status: AC | PRN
  Administered 2012-09-16: 1 mg via INTRAVENOUS

## 2012-09-16 NOTE — Telephone Encounter (Signed)
Called to let us know pts wight today is 237 [place] prednisone and abx as his PICC line site did not look well and he was having swelling from PICC line site down into his fingers. Pt only has  4x4 dressing around site now. Lorin Picket was unable to replace dressing today due to the gauze. States PICC line should be replaced as it looks very irritated Lorin Picket was unable to draw his labs after 4 unsuccessful attempts.

## 2012-09-16 NOTE — Procedures (Signed)
Placement of right jugular tunneled central venous cathter (Powerline).  Tip in lower SVC and ready to use.  Plan to remove right arm PICC.

## 2012-09-16 NOTE — Telephone Encounter (Signed)
PICC replaced sch for today, pt aware and will come to radiology now

## 2012-09-16 NOTE — H&P (Signed)
HPI: [name] is an 62 y.o. male with CHF who is on home milrinone. He has a RUE PICC but the site has become swollen and red concerning for infection. IR is asked to place a new tunneled IJ PICC and remove his existing Rt UE PICC. PMHx and meds reviewed. Pt has been NPO today and requested sedation.   Past Medical History:  Past Medical History  Diagnosis Date  . CHF (congestive heart failure)   . Sarcoidosis   . Cardiomyopathy, dilated, nonischemic     non ischemic by cath  . Acute on chronic systolic heart failure   . Automatic implantable cardiac defibrillator in situ   . Atrial fibrillation   . NSVT (nonsustained ventricular tachycardia)   . GERD (gastroesophageal reflux disease)   . Hypercholesteremia   . Myocardial infarction   . Shortness of breath   . Chronic kidney disease (CKD), stage III (moderate)   . Pacemaker   . Anginal pain   . Gout   . Hypertension     [name]  . Coronary artery disease     Past Surgical History:  Past Surgical History  Procedure Laterality Date  . Back surgery  1987    Ruptured disk repair  . Pacemaker insertion      with ICD  . Tee without cardioversion  01/17/2011    Procedure: TRANSESOPHAGEAL ECHOCARDIOGRAM (TEE);  Surgeon: Ricki Rodriguez, MD;  Location: [facility] ENDOSCOPY;  Service: Cardiovascular;  Laterality: N/A;  . Cardioversion  01/17/2011    Procedure: CARDIOVERSION;  Surgeon: Ricki Rodriguez, MD;  Location: [facility] ENDOSCOPY;  Service: Cardiovascular;  Laterality: N/A;  . Cardiac catheterization    . Insert / replace / remove pacemaker    . Anterior cervical decomp/discectomy fusion  08/21/2011    Procedure: ANTERIOR CERVICAL DECOMPRESSION/DISCECTOMY FUSION 2 LEVELS;  Surgeon: Eldred Manges, MD;  Location: [facility] OR;  Service: Orthopedics;  Laterality: N/A;  C5-6, C6-7 Anterior Cervical Discectomy and Fusion, allograft, plate  . Tee without cardioversion N/A 02/16/2012    Procedure: TRANSESOPHAGEAL ECHOCARDIOGRAM (TEE);   Surgeon: Dolores Patty, MD;  Location: [facility] ENDOSCOPY;  Service: Cardiovascular;  Laterality: N/A;  original case scheduled under his dad (who is deceased), rescheduled under correct mrn/pt/dob. [place]/dl  . Tee without cardioversion N/A 03/22/2012    Procedure: TRANSESOPHAGEAL ECHOCARDIOGRAM (TEE);  Surgeon: Lewayne Bunting, MD;  Location: [facility] ENDOSCOPY;  Service: Cardiovascular;  Laterality: N/A;  . Cardioversion N/A 03/22/2012    Procedure: CARDIOVERSION;  Surgeon: Lewayne Bunting, MD;  Location: [facility] ENDOSCOPY;  Service: Cardiovascular;  Laterality: N/A;  . Cardioversion N/A 04/26/2012    Procedure: CARDIOVERSION;  Surgeon: Dolores Patty, MD;  Location: [facility] ENDOSCOPY;  Service: Cardiovascular;  Laterality: N/A;    Family History:  Family History  Problem Relation Age of Onset  . Heart disease    . Heart failure    . Stroke    . Anesthesia problems Neg Hx   . Hypotension Neg Hx   . Malignant hyperthermia Neg Hx   . Pseudochol deficiency Neg Hx     Social History:  reports that he has never smoked. He has never used smokeless tobacco. He reports that he uses illicit drugs ("Crack" cocaine, Marijuana, and Cocaine). He reports that he does not drink alcohol.  Allergies: No Known Allergies  Medications:   Medication List    ASK your doctor about these medications       allopurinol 100 MG tablet  Commonly known as:  ZYLOPRIM  Take 1 tablet (100 mg total) by mouth daily.     amiodarone 400 MG tablet  Commonly known as:  PACERONE  Take 1 tablet (400 mg total) by mouth 2 (two) times daily.     apixaban 5 MG Tabs tablet  Commonly known as:  ELIQUIS  Take 0.5 tablets (2.5 mg total) by mouth 2 (two) times daily.     carvedilol 3.125 MG tablet  Commonly known as:  COREG  Take 3.125 mg by mouth 2 (two) times daily with a meal.     esomeprazole 40 MG capsule  Commonly known as:  NEXIUM  Take 40 mg by mouth daily as needed (for acid reflux).     hydrOXYzine 10 MG tablet   Commonly known as:  ATARAX/VISTARIL  Take 1 tablet (10 mg total) by mouth 3 (three) times daily as needed for itching.     LORazepam 1 MG tablet  Commonly known as:  ATIVAN  Take 0.5 tablets (0.5 mg total) by mouth 2 (two) times daily as needed for anxiety.     metolazone 2.5 MG tablet  Commonly known as:  ZAROXOLYN  Take 1 tablet (2.5 mg total) by mouth See admin instructions. Patient takes as directed by MD for fluid retention     milrinone 20 MG/100ML Soln infusion  Commonly known as:  PRIMACOR  Inject 37.3125 mcg/min into the vein continuous.     ondansetron 8 MG disintegrating tablet  Commonly known as:  ZOFRAN-ODT  Take 8 mg by mouth every 8 (eight) hours as needed for nausea. 8mg  ODT q4 hours prn nausea     potassium chloride SA 20 MEQ tablet  Commonly known as:  K-DUR,KLOR-CON  Take 2 tablets (40 mEq total) by mouth 2 (two) times daily.     rosuvastatin 5 MG tablet  Commonly known as:  CRESTOR  Take 1 tablet (5 mg total) by mouth at bedtime.     sildenafil 100 MG tablet  Commonly known as:  VIAGRA  Take 1 tablet (100 mg total) by mouth daily as needed for erectile dysfunction.     spironolactone 25 MG tablet  Commonly known as:  ALDACTONE  Take 0.5 tablets (12.5 mg total) by mouth daily.     torsemide 20 MG tablet  Commonly known as:  DEMADEX  Take 40 mg in AM and 20 mg in PM        Please HPI for pertinent positives, otherwise complete 10 system ROS negative.  Physical Exam: There were no vitals taken for this visit. There is no weight on file to calculate BMI.   General Appearance:  Alert, cooperative, no distress, appears stated age  Head:  Normocephalic, without obvious abnormality, atraumatic  ENT: Unremarkable  Neck: Supple, symmetrical, trachea midline  Lungs:   Clear to auscultation bilaterally.  Chest Wall:  No tenderness or deformity  Heart:  Regular rate and rhythm, S1, S2 normal, no murmur, rub or gallop.  Neurologic: Normal affect, no  gross deficits.   No results found for this or any previous visit (from the past 48 hour(s)). No results found.  Assessment/Plan CHF For tunneled IJ PICC with sedation Procedure discussed including risks, complications, use of sedation Consent signed in chart  Brayton El PA-C 09/16/2012, 12:17 PM

## 2012-09-19 ENCOUNTER — Emergency Department (HOSPITAL_COMMUNITY): Payer: PRIVATE HEALTH INSURANCE

## 2012-09-19 ENCOUNTER — Observation Stay (HOSPITAL_COMMUNITY)
Admission: EM | Admit: 2012-09-19 | Discharge: 2012-09-20 | Disposition: A | Discharge status: Home / Self Care | Payer: PRIVATE HEALTH INSURANCE | Attending: Internal Medicine | Admitting: Internal Medicine

## 2012-09-19 ENCOUNTER — Encounter (HOSPITAL_COMMUNITY): Payer: Self-pay

## 2012-09-19 DIAGNOSIS — R42 Dizziness and giddiness: Secondary | ICD-10-CM | POA: Insufficient documentation

## 2012-09-19 DIAGNOSIS — Z23 Encounter for immunization: Secondary | ICD-10-CM | POA: Insufficient documentation

## 2012-09-19 DIAGNOSIS — I5023 Acute on chronic systolic (congestive) heart failure: Secondary | ICD-10-CM | POA: Insufficient documentation

## 2012-09-19 DIAGNOSIS — I5022 Chronic systolic (congestive) heart failure: Secondary | ICD-10-CM

## 2012-09-19 DIAGNOSIS — E7800 Pure hypercholesterolemia, unspecified: Secondary | ICD-10-CM | POA: Insufficient documentation

## 2012-09-19 DIAGNOSIS — N289 Disorder of kidney and ureter, unspecified: Secondary | ICD-10-CM | POA: Insufficient documentation

## 2012-09-19 DIAGNOSIS — I509 Heart failure, unspecified: Secondary | ICD-10-CM | POA: Insufficient documentation

## 2012-09-19 DIAGNOSIS — I2510 Atherosclerotic heart disease of native coronary artery without angina pectoris: Secondary | ICD-10-CM | POA: Insufficient documentation

## 2012-09-19 DIAGNOSIS — I4891 Unspecified atrial fibrillation: Secondary | ICD-10-CM | POA: Insufficient documentation

## 2012-09-19 DIAGNOSIS — R079 Chest pain, unspecified: Principal | ICD-10-CM

## 2012-09-19 DIAGNOSIS — Z95810 Presence of automatic (implantable) cardiac defibrillator: Secondary | ICD-10-CM | POA: Insufficient documentation

## 2012-09-19 DIAGNOSIS — I428 Other cardiomyopathies: Secondary | ICD-10-CM | POA: Insufficient documentation

## 2012-09-19 DIAGNOSIS — R5381 Other malaise: Secondary | ICD-10-CM | POA: Insufficient documentation

## 2012-09-19 DIAGNOSIS — Z981 Arthrodesis status: Secondary | ICD-10-CM | POA: Insufficient documentation

## 2012-09-19 LAB — BASIC METABOLIC PANEL
CO2: 26 mEq/L (ref 19–32)
Calcium: 10 mg/dL (ref 8.4–10.5)
Calcium: 9.7 mg/dL (ref 8.4–10.5)
Creatinine, Ser: 3.78 mg/dL — ABNORMAL HIGH (ref 0.50–1.35)
Creatinine, Ser: 3.89 mg/dL — ABNORMAL HIGH (ref 0.50–1.35)
GFR calc Af Amer: 18 mL/min — ABNORMAL LOW (ref >90)
Glucose, Bld: 168 mg/dL — ABNORMAL HIGH (ref 70–99)
Sodium: 131 mEq/L — ABNORMAL LOW (ref 135–145)

## 2012-09-19 LAB — CBC
Hemoglobin: 14.2 g/dL (ref 13.0–17.0)
MCH: 31.4 pg (ref 26.0–34.0)
MCV: 89.4 fL (ref 78.0–100.0)
RBC: 4.52 MIL/uL (ref 4.22–5.81)

## 2012-09-19 LAB — CBC WITH DIFFERENTIAL/PLATELET
Basophils Absolute: 0.1 10*3/uL (ref 0.0–0.1)
Eosinophils Absolute: 0.3 10*3/uL (ref 0.0–0.7)
HCT: 42 % (ref 39.0–52.0)
Lymphs Abs: 2.3 10*3/uL (ref 0.7–4.0)
MCH: 32.4 pg (ref 26.0–34.0)
MCHC: 36.7 g/dL — ABNORMAL HIGH (ref 30.0–36.0)
MCV: 88.2 fL (ref 78.0–100.0)
Neutro Abs: 4.7 10*3/uL (ref 1.7–7.7)
RDW: 13.5 % (ref 11.5–15.5)

## 2012-09-19 LAB — TROPONIN I: Troponin I: <0.3 ng/mL (ref <0.30)

## 2012-09-19 MED ORDER — MORPHINE SULFATE 4 MG/ML IJ SOLN
4.0000 mg | Freq: Once | INTRAMUSCULAR | Status: AC
  Administered 2012-09-19: 4 mg via INTRAVENOUS
  Filled 2012-09-19: qty 1

## 2012-09-19 MED ORDER — ALLOPURINOL 100 MG PO TABS
100.0000 mg | ORAL_TABLET | Freq: Every day | ORAL | Status: DC
  Administered 2012-09-19 – 2012-09-20 (×2): 100 mg via ORAL
  Filled 2012-09-19 (×2): qty 1

## 2012-09-19 MED ORDER — PANTOPRAZOLE SODIUM 40 MG PO TBEC
40.0000 mg | DELAYED_RELEASE_TABLET | Freq: Every day | ORAL | Status: DC
  Administered 2012-09-19 – 2012-09-20 (×2): 40 mg via ORAL
  Filled 2012-09-19 (×2): qty 1

## 2012-09-19 MED ORDER — ACETAMINOPHEN 325 MG PO TABS: 650.0000 mg | ORAL_TABLET | ORAL | Status: DC | PRN

## 2012-09-19 MED ORDER — ONDANSETRON HCL 4 MG/2ML IJ SOLN: 4.0000 mg | Freq: Four times a day (QID) | INTRAMUSCULAR | Status: DC | PRN

## 2012-09-19 MED ORDER — APIXABAN 5 MG PO TABS
5.0000 mg | ORAL_TABLET | Freq: Two times a day (BID) | ORAL | Status: DC
  Administered 2012-09-19 – 2012-09-20 (×3): 5 mg via ORAL
  Filled 2012-09-19 (×4): qty 1

## 2012-09-19 MED ORDER — LORAZEPAM 0.5 MG PO TABS: 0.5000 mg | ORAL_TABLET | Freq: Two times a day (BID) | ORAL | Status: DC | PRN

## 2012-09-19 MED ORDER — MILRINONE IN DEXTROSE 20 MG/100ML IV SOLN: 0.3750 ug/kg/min | INTRAVENOUS | Status: DC

## 2012-09-19 MED ORDER — CARVEDILOL 3.125 MG PO TABS
3.1250 mg | ORAL_TABLET | Freq: Two times a day (BID) | ORAL | Status: DC
  Administered 2012-09-19 – 2012-09-20 (×3): 3.125 mg via ORAL
  Filled 2012-09-19 (×5): qty 1

## 2012-09-19 MED ORDER — NITROGLYCERIN 0.4 MG SL SUBL: 0.4000 mg | SUBLINGUAL_TABLET | SUBLINGUAL | Status: DC | PRN

## 2012-09-19 MED ORDER — AMIODARONE HCL 200 MG PO TABS
200.0000 mg | ORAL_TABLET | Freq: Two times a day (BID) | ORAL | Status: DC
  Administered 2012-09-19 – 2012-09-20 (×3): 200 mg via ORAL
  Filled 2012-09-19 (×4): qty 1

## 2012-09-19 MED ORDER — SPIRONOLACTONE 12.5 MG HALF TABLET
12.5000 mg | ORAL_TABLET | Freq: Every day | ORAL | Status: DC
  Administered 2012-09-19 – 2012-09-20 (×2): 12.5 mg via ORAL
  Filled 2012-09-19 (×2): qty 1

## 2012-09-19 MED ORDER — GUAIFENESIN 100 MG/5ML PO SYRP
200.0000 mg | ORAL_SOLUTION | ORAL | Status: DC | PRN
  Administered 2012-09-19 – 2012-09-20 (×3): 200 mg via ORAL
  Filled 2012-09-19 (×3): qty 10

## 2012-09-19 MED ORDER — INFLUENZA VAC SPLIT QUAD 0.5 ML IM SUSP
0.5000 mL | INTRAMUSCULAR | Status: AC
  Administered 2012-09-20: 0.5 mL via INTRAMUSCULAR
  Filled 2012-09-19: qty 0.5

## 2012-09-19 MED ORDER — HYDROXYZINE HCL 10 MG PO TABS
10.0000 mg | ORAL_TABLET | Freq: Three times a day (TID) | ORAL | Status: DC | PRN
  Administered 2012-09-19: 10 mg via ORAL
  Filled 2012-09-19 (×2): qty 1

## 2012-09-19 MED ORDER — HYDROCODONE-ACETAMINOPHEN 5-325 MG PO TABS
1.0000 | ORAL_TABLET | Freq: Four times a day (QID) | ORAL | Status: DC | PRN
  Administered 2012-09-19 – 2012-09-20 (×4): 1 via ORAL
  Filled 2012-09-19 (×4): qty 1

## 2012-09-19 MED ORDER — SODIUM CHLORIDE 0.9 % IJ SOLN: 10.0000 mL | Freq: Two times a day (BID) | INTRAMUSCULAR | Status: DC

## 2012-09-19 MED ORDER — ATORVASTATIN CALCIUM 10 MG PO TABS
10.0000 mg | ORAL_TABLET | Freq: Every day | ORAL | Status: DC
  Administered 2012-09-19: 10 mg via ORAL
  Filled 2012-09-19 (×2): qty 1

## 2012-09-19 MED ORDER — SODIUM CHLORIDE 0.9 % IV SOLN
INTRAVENOUS | Status: AC
  Administered 2012-09-19: 06:00:00 via INTRAVENOUS

## 2012-09-19 MED ORDER — POTASSIUM CHLORIDE CRYS ER 20 MEQ PO TBCR
40.0000 meq | EXTENDED_RELEASE_TABLET | Freq: Two times a day (BID) | ORAL | Status: DC
  Administered 2012-09-19 – 2012-09-20 (×3): 40 meq via ORAL
  Filled 2012-09-19 (×4): qty 2

## 2012-09-19 MED ORDER — BIOTENE DRY MOUTH MT LIQD
15.0000 mL | Freq: Two times a day (BID) | OROMUCOSAL | Status: DC
  Administered 2012-09-19 – 2012-09-20 (×3): 15 mL via OROMUCOSAL

## 2012-09-19 MED ORDER — MILRINONE IN DEXTROSE 20 MG/100ML IV SOLN
0.3750 ug/kg/min | INTRAVENOUS | Status: DC
  Filled 2012-09-19: qty 100

## 2012-09-19 MED ORDER — SODIUM CHLORIDE 0.9 % IJ SOLN
10.0000 mL | INTRAMUSCULAR | Status: DC | PRN
  Administered 2012-09-19 – 2012-09-20 (×2): 10 mL

## 2012-09-19 MED ORDER — BOOST / RESOURCE BREEZE PO LIQD
1.0000 | Freq: Three times a day (TID) | ORAL | Status: DC
  Administered 2012-09-19 – 2012-09-20 (×4): 1 via ORAL

## 2012-09-19 MED ORDER — SULFAMETHOXAZOLE-TRIMETHOPRIM 400-80 MG PO TABS
1.0000 | ORAL_TABLET | Freq: Two times a day (BID) | ORAL | Status: DC
  Administered 2012-09-19 – 2012-09-20 (×2): 1 via ORAL
  Filled 2012-09-19 (×3): qty 1

## 2012-09-19 NOTE — ED Notes (Signed)
Report called to 3w rm 34

## 2012-09-19 NOTE — Progress Notes (Signed)
Palliative Medicine Team consult received spoke with [name] NP who indicates consult request was for evaluation for hospice services at discharge -spoke with patient who informs he has spoken with Karie Mainland NP and Dr Gala Romney and is aware of recommended hospice evaluation and is agreeable- Mr Ticas stated he is not yet ready to turn his ICD off, he was informed an active ICD may not be a barrier to hospice admission - Mr Ernest stated he has questions for Dr Gala Romney about future treatments - he is agreeable to the Case Manager confirming hospice choice to allow the hospice agency to review his information, [name] [facility] notified  - as there are no additional needs PMT will sign off at this time   Valente David, RN 09/19/2012, 4:24 PM Palliative Medicine Team RN Liaison [phone]

## 2012-09-19 NOTE — Progress Notes (Signed)
[facility]  Patient Status: [name] is an active pt with [facility] at the time of his readmission to hospital.  [facility] is providing the following services:   [facility] HH RN, [facility] for home IV Milrinone.   [facility] hospital coordinator will follow pt and support transition to home when deemed appropriate. Pt remains on [facility] ambulatory pump for IV Milrinone.  Please contact our Pharmacy Team for any questions or problems with the pump.  [facility] IV Team aware pt is on pump as well.  If patient discharges after hours, please call [phone].   Sedalia Muta 09/19/2012, 3:09 PM

## 2012-09-19 NOTE — ED Provider Notes (Signed)
CSN: [id number]     Arrival date & time 09/19/12  0001 History   First MD Initiated Contact with Patient 09/19/12 0002     Chief Complaint  Patient presents with  . Chest Pain   (Consider location/radiation/quality/duration/timing/severity/associated sxs/prior Treatment) The history is provided by the patient.   62 year old male with history of congestive heart failure comes in because of chest pain which has been present all day. He describes a dull, pressure feeling across his anterior chest without radiation. Pain is rated at 8/10. Nothing makes it better nothing makes it worse. He is also noted weakness and dizziness. He states that when he tries to walk, he falls to the right. There's been no nausea vomiting and no dyspnea and no diaphoresis. He was brought in by ambulance and refused nitroglycerin because it gives him a severe headache. He was given aspirin in the ambulance. He has noted some distention of his abdomen but denies any peripheral edema.  Past Medical History  Diagnosis Date  . CHF (congestive heart failure)   . Sarcoidosis   . Cardiomyopathy, dilated, nonischemic     non ischemic by cath  . Acute on chronic systolic heart failure   . Automatic implantable cardiac defibrillator in situ   . Atrial fibrillation   . NSVT (nonsustained ventricular tachycardia)   . GERD (gastroesophageal reflux disease)   . Hypercholesteremia   . Myocardial infarction   . Shortness of breath   . Chronic kidney disease (CKD), stage III (moderate)   . Pacemaker   . Anginal pain   . Gout   . Hypertension     [name]  . Coronary artery disease    Past Surgical History  Procedure Laterality Date  . Back surgery  1987    Ruptured disk repair  . Pacemaker insertion      with ICD  . Tee without cardioversion  01/17/2011    Procedure: TRANSESOPHAGEAL ECHOCARDIOGRAM (TEE);  Surgeon: Ricki Rodriguez, MD;  Location: [facility] ENDOSCOPY;  Service: Cardiovascular;  Laterality: N/A;  .  Cardioversion  01/17/2011    Procedure: CARDIOVERSION;  Surgeon: Ricki Rodriguez, MD;  Location: [facility] ENDOSCOPY;  Service: Cardiovascular;  Laterality: N/A;  . Cardiac catheterization    . Insert / replace / remove pacemaker    . Anterior cervical decomp/discectomy fusion  08/21/2011    Procedure: ANTERIOR CERVICAL DECOMPRESSION/DISCECTOMY FUSION 2 LEVELS;  Surgeon: Eldred Manges, MD;  Location: [facility] OR;  Service: Orthopedics;  Laterality: N/A;  C5-6, C6-7 Anterior Cervical Discectomy and Fusion, allograft, plate  . Tee without cardioversion N/A 02/16/2012    Procedure: TRANSESOPHAGEAL ECHOCARDIOGRAM (TEE);  Surgeon: Dolores Patty, MD;  Location: [facility] ENDOSCOPY;  Service: Cardiovascular;  Laterality: N/A;  original case scheduled under his dad (who is deceased), rescheduled under correct mrn/pt/dob. [place]/dl  . Tee without cardioversion N/A 03/22/2012    Procedure: TRANSESOPHAGEAL ECHOCARDIOGRAM (TEE);  Surgeon: Lewayne Bunting, MD;  Location: [facility] ENDOSCOPY;  Service: Cardiovascular;  Laterality: N/A;  . Cardioversion N/A 03/22/2012    Procedure: CARDIOVERSION;  Surgeon: Lewayne Bunting, MD;  Location: [facility] ENDOSCOPY;  Service: Cardiovascular;  Laterality: N/A;  . Cardioversion N/A 04/26/2012    Procedure: CARDIOVERSION;  Surgeon: Dolores Patty, MD;  Location: [facility] ENDOSCOPY;  Service: Cardiovascular;  Laterality: N/A;   Family History  Problem Relation Age of Onset  . Heart disease    . Heart failure    . Stroke    . Anesthesia problems Neg Hx   . Hypotension Neg  Hx   . Malignant hyperthermia Neg Hx   . Pseudochol deficiency Neg Hx    History  Substance Use Topics  . Smoking status: Never Smoker   . Smokeless tobacco: Never Used  . Alcohol Use: No    Review of Systems  All other systems reviewed and are negative.    Allergies  Review of patient's allergies indicates no known allergies.  Home Medications   Current Outpatient Rx  Name  Route  Sig  Dispense  Refill  . allopurinol  (ZYLOPRIM) 100 MG tablet   Oral   Take 1 tablet (100 mg total) by mouth daily.   30 tablet   6   . amiodarone (PACERONE) 400 MG tablet   Oral   Take 1 tablet (400 mg total) by mouth 2 (two) times daily.   60 tablet   6   . apixaban (ELIQUIS) 5 MG TABS tablet   Oral   Take 0.5 tablets (2.5 mg total) by mouth 2 (two) times daily.   60 tablet   3   . carvedilol (COREG) 3.125 MG tablet   Oral   Take 3.125 mg by mouth 2 (two) times daily with a meal.         . esomeprazole (NEXIUM) 40 MG capsule   Oral   Take 40 mg by mouth daily as needed (for acid reflux).          . hydrOXYzine (ATARAX/VISTARIL) 10 MG tablet   Oral   Take 1 tablet (10 mg total) by mouth 3 (three) times daily as needed for itching.   30 tablet   0   . LORazepam (ATIVAN) 1 MG tablet   Oral   Take 0.5 tablets (0.5 mg total) by mouth 2 (two) times daily as needed for anxiety.   15 tablet   0   . metolazone (ZAROXOLYN) 2.5 MG tablet   Oral   Take 1 tablet (2.5 mg total) by mouth See admin instructions. Patient takes as directed by MD for fluid retention   10 tablet   3   . milrinone (PRIMACOR) 20 MG/100ML SOLN infusion   Intravenous   Inject 37.3125 mcg/min into the vein continuous.   100 mL      . ondansetron (ZOFRAN-ODT) 8 MG disintegrating tablet   Oral   Take 8 mg by mouth every 8 (eight) hours as needed for nausea. 8mg  ODT q4 hours prn nausea         . potassium chloride SA (K-DUR,KLOR-CON) 20 MEQ tablet   Oral   Take 2 tablets (40 mEq total) by mouth 2 (two) times daily.   120 tablet   3   . rosuvastatin (CRESTOR) 5 MG tablet   Oral   Take 1 tablet (5 mg total) by mouth at bedtime.         . sildenafil (VIAGRA) 100 MG tablet   Oral   Take 1 tablet (100 mg total) by mouth daily as needed for erectile dysfunction.   4 tablet   1   . spironolactone (ALDACTONE) 25 MG tablet   Oral   Take 0.5 tablets (12.5 mg total) by mouth daily.   45 tablet   3     Please file patient  already has some at home.   . torsemide (DEMADEX) 20 MG tablet      Take 40 mg in AM and 20 mg in PM          BP 111/85  Temp(Src) 98.2 F (36.8 C) (  Oral)  Resp 20  SpO2 94% Physical Exam  Nursing note and vitals reviewed.  62 year old male, resting comfortably and in no acute distress. Vital signs are normal. Oxygen saturation is 94%, which is normal. Head is normocephalic and atraumatic. PERRLA, EOMI. Oropharynx is clear. Neck is nontender and supple without adenopathy. Back is nontender and there is no CVA tenderness. Lungs are clear without rales, wheezes, or rhonchi. Chest is nontender. Heart has regular rate and rhythm with a 2/6 early systolic murmur heard along left sternal border. Abdomen is soft, flat, nontender without masses or hepatosplenomegaly and peristalsis is normoactive. Extremities have no cyanosis or edema, full range of motion is present. Skin is warm and dry without rash. Neurologic: Mental status is normal, cranial nerves are intact, there are no motor or sensory deficits. Finger to nose testing shows significant tremor but no past pointing. Gait is not tested.  ED Course  Procedures (including critical care time) Labs Review Results for orders placed during the hospital encounter of 09/19/12  CBC WITH DIFFERENTIAL      Result Value Range   WBC 8.5  4.0 - 10.5 K/uL   RBC 4.76  4.22 - 5.81 MIL/uL   Hemoglobin 15.4  13.0 - 17.0 g/dL   HCT 16.1  09.6 - 04.5 %   MCV 88.2  78.0 - 100.0 fL   MCH 32.4  26.0 - 34.0 pg   MCHC 36.7 (*) 30.0 - 36.0 g/dL   RDW 40.9  81.1 - 91.4 %   Platelets 227  150 - 400 K/uL   Neutrophils Relative % 55  43 - 77 %   Lymphocytes Relative 27  12 - 46 %   Monocytes Relative 13 (*) 3 - 12 %   Eosinophils Relative 4  0 - 5 %   Basophils Relative 1  0 - 1 %   Neutro Abs 4.7  1.7 - 7.7 K/uL   Lymphs Abs 2.3  0.7 - 4.0 K/uL   Monocytes Absolute 1.1 (*) 0.1 - 1.0 K/uL   Eosinophils Absolute 0.3  0.0 - 0.7 K/uL   Basophils  Absolute 0.1  0.0 - 0.1 K/uL  BASIC METABOLIC PANEL      Result Value Range   Sodium 135  135 - 145 mEq/L   Potassium 3.5  3.5 - 5.1 mEq/L   Chloride 93 (*) 96 - 112 mEq/L   CO2 26  19 - 32 mEq/L   Glucose, Bld 183 (*) 70 - 99 mg/dL   BUN 78 (*) 6 - 23 mg/dL   Creatinine, Ser 7.82 (*) 0.50 - 1.35 mg/dL   Calcium 95.6  8.4 - 21.3 mg/dL   GFR calc non Af Amer 16 (*) >90 mL/min   GFR calc Af Amer 18 (*) >90 mL/min  TROPONIN I      Result Value Range   Troponin I <0.30  <0.30 ng/mL   Ct Head Wo Contrast  09/19/2012   CLINICAL DATA:  Chest pain  EXAM: CT HEAD WITHOUT CONTRAST  TECHNIQUE: Contiguous axial images were obtained from the base of the skull through the vertex without intravenous contrast.  COMPARISON:  10/07/2011.  FINDINGS: Skull:No acute osseous abnormality. No lytic or blastic lesion.  Orbits: No acute abnormality.  Brain: No evidence of acute abnormality, such as acute infarction, hemorrhage, hydrocephalus, or mass lesion/mass effect. There is been a remote cortical infarct involving the left occipital pole. Question mild, patchy bilateral cerebral white matter low-attenuation, compatible with chronic small vessel ischemia.  IMPRESSION: 1. No evidence of acute intracranial disease. 2. Remote, small left occipital cortical infarct.   Electronically Signed   By: Tiburcio Pea   On: 09/19/2012 01:07   Dg Chest Port 1 View  09/19/2012   CLINICAL DATA:  Chest pain. Shortness of breath.  EXAM: PORTABLE CHEST - 1 VIEW  COMPARISON:  02/18/2012.  FINDINGS: Single chamber left subclavian approach AICD/pacer in remarkable position. Right IJ central venous catheter, tip at the mid SVC.  Chronic cardiopericardial enlargement. Tortuous thoracic aorta, similar in appearance to radiograph 01/25/2012. No overt edema. No infiltrate, effusion, or pneumothorax. No acute osseous findings. Prior lower cervical ACDF with ventral plate and screw fixation.  IMPRESSION: Cardiomegaly without failure.    Electronically Signed   By: Tiburcio Pea   On: 09/19/2012 00:54    Imaging Review Ct Head Wo Contrast  09/19/2012   CLINICAL DATA:  Chest pain  EXAM: CT HEAD WITHOUT CONTRAST  TECHNIQUE: Contiguous axial images were obtained from the base of the skull through the vertex without intravenous contrast.  COMPARISON:  10/07/2011.  FINDINGS: Skull:No acute osseous abnormality. No lytic or blastic lesion.  Orbits: No acute abnormality.  Brain: No evidence of acute abnormality, such as acute infarction, hemorrhage, hydrocephalus, or mass lesion/mass effect. There is been a remote cortical infarct involving the left occipital pole. Question mild, patchy bilateral cerebral white matter low-attenuation, compatible with chronic small vessel ischemia.  IMPRESSION: 1. No evidence of acute intracranial disease. 2. Remote, small left occipital cortical infarct.   Electronically Signed   By: Tiburcio Pea   On: 09/19/2012 01:07   Dg Chest Port 1 View  09/19/2012   CLINICAL DATA:  Chest pain. Shortness of breath.  EXAM: PORTABLE CHEST - 1 VIEW  COMPARISON:  02/18/2012.  FINDINGS: Single chamber left subclavian approach AICD/pacer in remarkable position. Right IJ central venous catheter, tip at the mid SVC.  Chronic cardiopericardial enlargement. Tortuous thoracic aorta, similar in appearance to radiograph 01/25/2012. No overt edema. No infiltrate, effusion, or pneumothorax. No acute osseous findings. Prior lower cervical ACDF with ventral plate and screw fixation.  IMPRESSION: Cardiomegaly without failure.   Electronically Signed   By: Tiburcio Pea   On: 09/19/2012 00:54     Date: 09/19/2012  Rate: 97  Rhythm: normal sinus rhythm  QRS Axis: left  Intervals: QT prolonged  ST/T Wave abnormalities: nonspecific T wave changes  Conduction Disutrbances:left anterior fascicular block and Incomplete right bundle-branch block  Narrative Interpretation: Prolonged QT interval, left anterior fascicular block, incomplete  right bundle-branch block. When compared with ECG of 08/20/2012, QT has shortened.  Old EKG Reviewed: changes noted   MDM   1. Chest pain   2. NICM (nonischemic cardiomyopathy)   3. Renal insufficiency   4. Dizziness    Chest pain in patient with long history of congestive heart failure. Dizziness is worrisome for central vertigo. Old records are reviewed and he has nonischemic cardiomyopathy. He is status post implanted defibrillator and has had several hospital admissions for difficulty breathing and chest pain. You'll be sent for CT of his head, and laboratory workup is initiated. He is given morphine for pain.  He required an additional doses of morphine for pain control. CT is unremarkable. Renal function is at baseline. Case is discussed with Dr. Ronney Asters, on-call for cardiology, and agrees to admit the patient.    Dione Booze, MD 09/19/12 [phone]

## 2012-09-19 NOTE — Progress Notes (Addendum)
INITIAL NUTRITION ASSESSMENT  DOCUMENTATION CODES Per approved criteria  -Not Applicable   INTERVENTION:  Resource Breeze PO TID to maximize protein and calorie intake, each supplement provides 250 kcal and 9 grams of protein.  NUTRITION DIAGNOSIS: Inadequate oral intake related to poor appetite as evidenced by minimal intake of meals.   Goal: Intake to meet >90% of estimated nutrition needs.  Monitor:  PO intake, labs, weight trend.  Reason for Assessment: MST=2  62 y.o. male  Admitting Dx: Chest Pain  ASSESSMENT: Patient admitted with fatigue, palpitations, low energy, nausea, and orthostasis with chest heaviness. History of severe HF due to NICM (EF 20-25%) with multiple hospital admissions for HF exacerbations. Dry weight per MD notes is 219 lb (11/2011).  Patient reports poor appetite and poor intake, but has been gaining weight, up to 338 lbs a few days PTA, then dropped 8 lbs to 330 lb in one day. Per MD notes, he is mildly volume depleted in the setting of possible recent gastro-enteritis and poor po intake. Patient ate none of his food at breakfast this morning, drank all the liquids (coffee, juice). Noted possibility for D/C home tomorrow AM.  Nutrition Focused Physical Exam:  Subcutaneous Fat:  Orbital Region: WNL Upper Arm Region: WNL Thoracic and Lumbar Region: WNL  Muscle:  Temple Region: WNL Clavicle Bone Region: WNL Clavicle and Acromion Bone Region: WNL Scapular Bone Region: WNL Dorsal Hand: WNL Patellar Region: mild depletion Anterior Thigh Region: NA Posterior Calf Region: mild depletion  Edema: none   Height: Ht Readings from Last 1 Encounters:  09/19/12 6\' 3"  (1.905 m)    Weight: Wt Readings from Last 1 Encounters:  09/19/12 231 lb 6.4 oz (104.962 kg)   Ideal Body Weight: 89.1 kg  % Ideal Body Weight: 118%  Wt Readings from Last 10 Encounters:  09/19/12 231 lb 6.4 oz (104.962 kg)  09/04/12 233 lb 12.8 oz (106.051 kg)  08/20/12 234  lb (106.142 kg)  08/15/12 231 lb 6.4 oz (104.962 kg)  07/24/12 231 lb 12.8 oz (105.144 kg)  07/17/12 228 lb (103.42 kg)  07/17/12 228 lb (103.42 kg)  07/17/12 228 lb (103.42 kg)  07/15/12 228 lb 12 oz (103.76 kg)  07/10/12 227 lb (102.967 kg)    Usual Body Weight: 227 lb  % Usual Body Weight: 102%  BMI:  Body mass index is 28.92 kg/(m^2). overweight  Estimated Nutritional Needs: Kcal: 2200-2400 Protein: 115-130 gm Fluid: ~2 L or per MD discretion  Skin: no issues  Diet Order: Cardiac  EDUCATION NEEDS: -Education not appropriate at this time  No intake or output data in the 24 hours ending 09/19/12 0957  Last BM: none documented   Labs:   Recent Labs Lab 09/19/12 0038 09/19/12 0640  NA 135 131*  K 3.5 3.1*  CL 93* 91*  CO2 26 26  BUN 78* 78*  CREATININE 3.78* 3.89*  CALCIUM 10.0 9.7  GLUCOSE 183* 168*    CBG (last 3)  No results found for this basename: GLUCAP,  in the last 72 hours  Scheduled Meds: . allopurinol  100 mg Oral Daily  . amiodarone  200 mg Oral BID  . antiseptic oral rinse  15 mL Mouth Rinse BID  . apixaban  5 mg Oral BID  . atorvastatin  10 mg Oral q1800  . carvedilol  3.125 mg Oral BID WC  . [START ON 09/20/2012] influenza vac split quadrivalent PF  0.5 mL Intramuscular Tomorrow-1000  . pantoprazole  40 mg Oral Daily  .  potassium chloride SA  40 mEq Oral BID  . spironolactone  12.5 mg Oral Daily    Continuous Infusions: . milrinone      Past Medical History  Diagnosis Date  . CHF (congestive heart failure)   . Sarcoidosis   . Cardiomyopathy, dilated, nonischemic     non ischemic by cath  . Acute on chronic systolic heart failure   . Automatic implantable cardiac defibrillator in situ   . Atrial fibrillation   . NSVT (nonsustained ventricular tachycardia)   . GERD (gastroesophageal reflux disease)   . Hypercholesteremia   . Myocardial infarction   . Shortness of breath   . Chronic kidney disease (CKD), stage III  (moderate)   . Pacemaker   . Anginal pain   . Gout   . Hypertension     [name]  . Coronary artery disease     Past Surgical History  Procedure Laterality Date  . Back surgery  1987    Ruptured disk repair  . Pacemaker insertion      with ICD  . Tee without cardioversion  01/17/2011    Procedure: TRANSESOPHAGEAL ECHOCARDIOGRAM (TEE);  Surgeon: Ricki Rodriguez, MD;  Location: [facility] ENDOSCOPY;  Service: Cardiovascular;  Laterality: N/A;  . Cardioversion  01/17/2011    Procedure: CARDIOVERSION;  Surgeon: Ricki Rodriguez, MD;  Location: [facility] ENDOSCOPY;  Service: Cardiovascular;  Laterality: N/A;  . Cardiac catheterization    . Insert / replace / remove pacemaker    . Anterior cervical decomp/discectomy fusion  08/21/2011    Procedure: ANTERIOR CERVICAL DECOMPRESSION/DISCECTOMY FUSION 2 LEVELS;  Surgeon: Eldred Manges, MD;  Location: [facility] OR;  Service: Orthopedics;  Laterality: N/A;  C5-6, C6-7 Anterior Cervical Discectomy and Fusion, allograft, plate  . Tee without cardioversion N/A 02/16/2012    Procedure: TRANSESOPHAGEAL ECHOCARDIOGRAM (TEE);  Surgeon: Dolores Patty, MD;  Location: [facility] ENDOSCOPY;  Service: Cardiovascular;  Laterality: N/A;  original case scheduled under his dad (who is deceased), rescheduled under correct mrn/pt/dob. [place]/dl  . Tee without cardioversion N/A 03/22/2012    Procedure: TRANSESOPHAGEAL ECHOCARDIOGRAM (TEE);  Surgeon: Lewayne Bunting, MD;  Location: [facility] ENDOSCOPY;  Service: Cardiovascular;  Laterality: N/A;  . Cardioversion N/A 03/22/2012    Procedure: CARDIOVERSION;  Surgeon: Lewayne Bunting, MD;  Location: [facility] ENDOSCOPY;  Service: Cardiovascular;  Laterality: N/A;  . Cardioversion N/A 04/26/2012    Procedure: CARDIOVERSION;  Surgeon: Dolores Patty, MD;  Location: [facility] ENDOSCOPY;  Service: Cardiovascular;  Laterality: N/A;    Joaquin Courts, RD, LDN, CNSC Pager [phone] After Hours Pager [phone]

## 2012-09-19 NOTE — Progress Notes (Signed)
[facility]  Patient Status: Active (receiving services up to time of hospitalization)  [facility] is providing the following services: RN and Home Infusion Services (teaching and education will be done by nurse in the home with patient and caregiver)  If patient discharges after hours, please call [phone].   [name] 09/19/2012, 10:25 AM

## 2012-09-19 NOTE — Progress Notes (Signed)
Utilization review completed.  

## 2012-09-19 NOTE — ED Notes (Signed)
Per EMS, pt has been c/o weakness and substernal chest pain with no radiation. Pt sts he feels his heart is racing with palpitations.  Also reporting shortness of breath and right sided abdominal pain. Sts abdomen is more distended than normal. Pt recently had PICC placed on Monday for Mildarone pump for his CHF.  Pt given 324 ASA PTA.  Refused nitro due to headache side effect.

## 2012-09-19 NOTE — H&P (Signed)
Cardiology H&P  Primary Care Povider: Darrell Meres, MD Primary Cardiologist: Dr. Gala [name]   HPI: [name] is a 62 y.o.male is a 62 y.o. gentlemen with severe HF due to NICM (EF 20-25%) with multiple hospital admissions for HF exacerbations. He also has history of ventricular tachycardia s/p St Jude ICD by Dr. Ladona [name], chronic kidney disease: stage III, baseline Cr ~2, and paroxysmal atrial arrhythmias controlled by amiodarone. As well as sarcoidosis, hypertension. Cath in 2008 by Dr. Sharyn [name] showed no CAD. Dry weight is 219 (11/2011). Milrinone initiated in January for low output.  He presented to the ED this evening after feeling quite poorly all day long. He describes fatigue, palpitations, low energy, nausea, and orthostasis. He also described a chest heaviness throughout the day. He largely stayed in bed throughout today. When his sx persisted, he finally decided to present to the ED. There his evaluation was largely reassuring. CXR showed cardiomegaly without overt acute failure. Labs were largely unremarkable. He was provided morphine x 2 with improvement in chest discomfort. He is being placed in obs for further eval and mgmt.   Of note, he has been compliant with his medications. He reports almost 10 lb. change in weight from Tuesday to Wednesday (338 lbs to 330 lbs). I performed bedsided orthostatics. Lying HR 96, SBP 93. Sitting HR 110s, SBP 70.    Past Medical History  Diagnosis Date  . CHF (congestive heart failure)   . Sarcoidosis   . Cardiomyopathy, dilated, nonischemic     non ischemic by cath  . Acute on chronic systolic heart failure   . Automatic implantable cardiac defibrillator in situ   . Atrial fibrillation   . NSVT (nonsustained ventricular tachycardia)   . GERD (gastroesophageal reflux disease)   . Hypercholesteremia   . Myocardial infarction   . Shortness of breath   . Chronic kidney disease (CKD), stage III (moderate)   . Pacemaker   . Anginal pain   .  Gout   . Hypertension     dr [name]  . Coronary artery disease     Past Surgical History  Procedure Laterality Date  . Back surgery  1987    Ruptured disk repair  . Pacemaker insertion      with ICD  . Tee without cardioversion  01/17/2011    Procedure: TRANSESOPHAGEAL ECHOCARDIOGRAM (TEE);  Surgeon: Ricki Rodriguez, MD;  Location: [facility] ENDOSCOPY;  Service: Cardiovascular;  Laterality: N/A;  . Cardioversion  01/17/2011    Procedure: CARDIOVERSION;  Surgeon: Ricki Rodriguez, MD;  Location: [facility] ENDOSCOPY;  Service: Cardiovascular;  Laterality: N/A;  . Cardiac catheterization    . Insert / replace / remove pacemaker    . Anterior cervical decomp/discectomy fusion  08/21/2011    Procedure: ANTERIOR CERVICAL DECOMPRESSION/DISCECTOMY FUSION 2 LEVELS;  Surgeon: Eldred Manges, MD;  Location: [facility] OR;  Service: Orthopedics;  Laterality: N/A;  C5-6, C6-7 Anterior Cervical Discectomy and Fusion, allograft, plate  . Tee without cardioversion N/A 02/16/2012    Procedure: TRANSESOPHAGEAL ECHOCARDIOGRAM (TEE);  Surgeon: Dolores Patty, MD;  Location: [facility] ENDOSCOPY;  Service: Cardiovascular;  Laterality: N/A;  original case scheduled under his dad (who is deceased), rescheduled under correct mrn/pt/dob. Ashe/dl  . Tee without cardioversion N/A 03/22/2012    Procedure: TRANSESOPHAGEAL ECHOCARDIOGRAM (TEE);  Surgeon: Lewayne Bunting, MD;  Location: [facility] ENDOSCOPY;  Service: Cardiovascular;  Laterality: N/A;  . Cardioversion N/A 03/22/2012    Procedure: CARDIOVERSION;  Surgeon: Lewayne Bunting, MD;  Location: [facility] ENDOSCOPY;  Service:  Cardiovascular;  Laterality: N/A;  . Cardioversion N/A 04/26/2012    Procedure: CARDIOVERSION;  Surgeon: Dolores Patty, MD;  Location: [facility] ENDOSCOPY;  Service: Cardiovascular;  Laterality: N/A;    Family History  Problem Relation Age of Onset  . Heart disease    . Heart failure    . Stroke    . Anesthesia problems Neg Hx   . Hypotension Neg Hx   . Malignant hyperthermia Neg Hx    . Pseudochol deficiency Neg Hx     Social History:  reports that he has never smoked. He has never used smokeless tobacco. He reports that he uses illicit drugs ("Crack" cocaine, Marijuana, and Cocaine). He reports that he does not drink alcohol.  Allergies: No Known Allergies  No current facility-administered medications for this encounter.   Current Outpatient Prescriptions  Medication Sig Dispense Refill  . allopurinol (ZYLOPRIM) 100 MG tablet Take 1 tablet (100 mg total) by mouth daily.  30 tablet  6  . amiodarone (PACERONE) 200 MG tablet Take 200 mg by mouth 2 (two) times daily.      Marland Kitchen apixaban (ELIQUIS) 5 MG TABS tablet Take 0.5 tablets (2.5 mg total) by mouth 2 (two) times daily.  60 tablet  3  . carvedilol (COREG) 3.125 MG tablet Take 3.125 mg by mouth 2 (two) times daily with a meal.      . esomeprazole (NEXIUM) 40 MG capsule Take 40 mg by mouth daily as needed (for acid reflux).       . famotidine (PEPCID) 20 MG tablet Take 20 mg by mouth 2 (two) times daily.      Marland Kitchen HYDROcodone-acetaminophen (NORCO/VICODIN) 5-325 MG per tablet Take 1 tablet by mouth every 6 (six) hours as needed for pain.      . hydrOXYzine (ATARAX/VISTARIL) 10 MG tablet Take 1 tablet (10 mg total) by mouth 3 (three) times daily as needed for itching.  30 tablet  0  . LORazepam (ATIVAN) 0.5 MG tablet Take 0.5 mg by mouth 2 (two) times daily as needed for anxiety.      . metolazone (ZAROXOLYN) 2.5 MG tablet Take 1 tablet (2.5 mg total) by mouth See admin instructions. Patient takes as directed by MD for fluid retention  10 tablet  3  . milrinone (PRIMACOR) 20 MG/100ML SOLN infusion Inject 37.3125 mcg/min into the vein continuous.  100 mL    . potassium chloride SA (K-DUR,KLOR-CON) 20 MEQ tablet Take 2 tablets (40 mEq total) by mouth 2 (two) times daily.  120 tablet  3  . rosuvastatin (CRESTOR) 5 MG tablet Take 1 tablet (5 mg total) by mouth at bedtime.      . sildenafil (VIAGRA) 100 MG tablet Take 1 tablet (100 mg  total) by mouth daily as needed for erectile dysfunction.  4 tablet  1  . spironolactone (ALDACTONE) 25 MG tablet Take 0.5 tablets (12.5 mg total) by mouth daily.  45 tablet  3  . sulfamethoxazole-trimethoprim (BACTRIM DS,SEPTRA DS) 800-160 MG per tablet Take 1 tablet by mouth 2 (two) times daily. For 10 days. Started 09/14/12      . torsemide (DEMADEX) 20 MG tablet Take 40 mg in AM and 20 mg in PM        ROS: A full review of systems is obtained and is negative except as noted in the HPI.  Physical Exam: Blood pressure 93/68, pulse 99, temperature 98.2 F (36.8 C), temperature source Oral, resp. rate 20, SpO2 97.00%.  GENERAL: Middle-aged AAM, lying in bed.  Feels poorly but in no acute distress.  EYES: Extra ocular movements are intact. There is no lid lag. Sclera is anicteric.  ENT: Oropharynx is clear. Dentition is within normal limits.  NECK: Supple. The thyroid is not enlarged.  LYMPH: There are no masses or lymphadenopathy present.  HEART: PMI inferolaterally displaced. RRR. Faint holosystolic murmur I/VI at LSB. No apparent S3 or S4 noted. No JVD LUNGS: Clear to auscultation There are no rales, rhonchi, or wheezes.  ABDOMEN: Soft, non-tender, and non-distended.  Abdominal aorta non-palpable.  EXTREMITIES: No clubbing, cyanosis, or edema.  PULSES: Carotids were +2 and equal bilaterally with no bruits. Peripheral pulses 2+.  SKIN: Warm, dry, and intact.  NEUROLOGIC: The patient was oriented to person, place, and time. No overt neurologic deficits were detected.  PSYCH: Normal judgment and insight, mood is appropriate.   Results: Results for orders placed during the hospital encounter of 09/19/12 (from the past 24 hour(s))  CBC WITH DIFFERENTIAL     Status: Abnormal   Collection Time    09/19/12 12:38 AM      Result Value Range   WBC 8.5  4.0 - 10.5 K/uL   RBC 4.76  4.22 - 5.81 MIL/uL   Hemoglobin 15.4  13.0 - 17.0 g/dL   HCT 78.2  95.6 - 21.3 %   MCV 88.2  78.0 - 100.0 fL    MCH 32.4  26.0 - 34.0 pg   MCHC 36.7 (*) 30.0 - 36.0 g/dL   RDW 08.6  57.8 - 46.9 %   Platelets 227  150 - 400 K/uL   Neutrophils Relative % 55  43 - 77 %   Lymphocytes Relative 27  12 - 46 %   Monocytes Relative 13 (*) 3 - 12 %   Eosinophils Relative 4  0 - 5 %   Basophils Relative 1  0 - 1 %   Neutro Abs 4.7  1.7 - 7.7 K/uL   Lymphs Abs 2.3  0.7 - 4.0 K/uL   Monocytes Absolute 1.1 (*) 0.1 - 1.0 K/uL   Eosinophils Absolute 0.3  0.0 - 0.7 K/uL   Basophils Absolute 0.1  0.0 - 0.1 K/uL  BASIC METABOLIC PANEL     Status: Abnormal   Collection Time    09/19/12 12:38 AM      Result Value Range   Sodium 135  135 - 145 mEq/L   Potassium 3.5  3.5 - 5.1 mEq/L   Chloride 93 (*) 96 - 112 mEq/L   CO2 26  19 - 32 mEq/L   Glucose, Bld 183 (*) 70 - 99 mg/dL   BUN 78 (*) 6 - 23 mg/dL   Creatinine, Ser 6.29 (*) 0.50 - 1.35 mg/dL   Calcium 52.8  8.4 - 41.3 mg/dL   GFR calc non Af Amer 16 (*) >90 mL/min   GFR calc Af Amer 18 (*) >90 mL/min  TROPONIN I     Status: None   Collection Time    09/19/12 12:38 AM      Result Value Range   Troponin I <0.30  <0.30 ng/mL    EKG: Sinus tachycardia. RBBB. LAFB.  CXR: Cardiomegaly without overt pulm edema  Assessment/Plan: 62 y/o male with chronic severe non-ischemic cardiomyopathy, multiple medical co-morbidities now with symptoms of lightheadedness, fatigue, nausea, and chest tightness. He appears to be volume depleted on exam and is orthostatic. His labwork is largely unremarkable, and he does not appear to be infected (clean new PICC line site). Plan is to  place patient in observation under Dr. Gala [name]. Will very gently hydrate and hold AM diuretics and reassess volume status and sx in AM. In interim, will otherwise c/w his home medication program including milrinone infusion.    [name] 09/19/2012, 2:53 AM

## 2012-09-19 NOTE — Progress Notes (Signed)
  Patient seen & examined. Dr. Lyn Henri H&P reviewed.   Agree that he appears mildly volume depleted in the setting of possible recent gastro-enteritis and poor po intake. BP improved but still feels weak.  Would keep him in observation one more day and plan d/c in am. Hold diuretics. Can increase apixaban to 5 bid.   We did discuss the fact that his renal function has been getting progressively worse and long-term prognosis is not good as this precludes our ability to offer him advanced therapies.   Latorsha Curling,MD 9:41 AM

## 2012-09-19 NOTE — Care Management Note (Signed)
    Page 1 of 2   09/20/2012     12:04:35 PM   CARE MANAGEMENT NOTE 09/20/2012  Patient:  [name], [name]   Account Number:  [id number]  Date Initiated:  09/19/2012  Documentation initiated by:  [name]  Subjective/Objective Assessment:   Pt admitted with c/p and CHF     Action/Plan:   PTA pt lived at home and is on home IV Mil[facility]one gtt with [facility]   Anticipated DC Date:  09/20/2012   Anticipated DC Plan:  HOME W HOME HEALTH SERVICES      DC Planning Services  CM consult      [facility] Choice  Resumption Of Svcs/PTA Provider   Choice offered to / List presented to:  C-1 Patient        HH arranged  HH-10 DISEASE MANAGEMENT      HH agency  [facility]  [facility] [place]   Status of service:  Completed, signed off Medicare Important Message given?   (If response is "NO", the following Medicare IM given date fields will be blank) Date Medicare IM given:   Date Additional Medicare IM given:    Discharge Disposition:  HOME W HOSPICE CARE  Per UR Regulation:  Reviewed for med. necessity/level of care/duration of stay  If discussed at Long Length of Stay Meetings, dates discussed:    Comments:  09/20/12- 1200- [name] RN, BSN [phone] Per Margie with [facility]- pt has met criteria for home hospice services and can continue IV milrinone for now with [facility]- have spoken with Pam-with [facility] to notify of pt's d/c for today and plans to go home with [facility]. Spoke with pt at bedside-to let him know plan for home hospice and cont. iv milrinone at this time with [facility]- pt voices that he is glad it has worked out for home hospice- questions answered and explained to pt that Hospice representative would also be coming by to answer questions. Pt to travel home by private car.    09/19/12- 1615- [name] RN, BSN [phone] Referral for [facility]- HF - pt is already active with [facility] for home IV Milrinone gtt and is currently on his home pump. Also received call  from [facility] with [facility] team regarding possible home with hospice. In to speak with pt at bedside to discuss d/c needs and plans. Per conversation with pt- pt lives at home here- but travels to [place] [place] to see finance once a month- he plans to continue to do that as long as he can- he states that he is interested in starting home hospice services for the support- as he knows that things will get worse and he does not have a lot of support from family here. He does state that he wants to continue his IV milrinone gtt at this time and understands that he might not be allowed to have both [facility] and Hospice services at the same time. Offered pt list of Home Hospice agencies- and pt states that his choice for hospice services would be [facility]. Informed pt that things would be looked into as far as his IV med needs and hospice and CM would f/u with him in am to finalize his d/c plans and needs.

## 2012-09-20 DIAGNOSIS — D869 Sarcoidosis, unspecified: Secondary | ICD-10-CM

## 2012-09-20 DIAGNOSIS — I509 Heart failure, unspecified: Secondary | ICD-10-CM

## 2012-09-20 DIAGNOSIS — I4901 Ventricular fibrillation: Secondary | ICD-10-CM

## 2012-09-20 DIAGNOSIS — I4891 Unspecified atrial fibrillation: Secondary | ICD-10-CM

## 2012-09-20 DIAGNOSIS — N184 Chronic kidney disease, stage 4 (severe): Secondary | ICD-10-CM

## 2012-09-20 MED ORDER — HEPARIN SOD (PORK) LOCK FLUSH 100 UNIT/ML IV SOLN
250.0000 [IU] | Freq: Every day | INTRAVENOUS | Status: DC
  Administered 2012-09-20: 250 [IU]
  Filled 2012-09-20: qty 3

## 2012-09-20 MED ORDER — APIXABAN 5 MG PO TABS: 5.0000 mg | ORAL_TABLET | Freq: Two times a day (BID) | ORAL | Status: DC

## 2012-09-20 MED ORDER — GUAIFENESIN 100 MG/5ML PO SYRP: 200.0000 mg | ORAL_SOLUTION | ORAL | Status: DC | PRN

## 2012-09-20 MED ORDER — HYDROCODONE-ACETAMINOPHEN 5-325 MG PO TABS: 1.0000 | ORAL_TABLET | Freq: Four times a day (QID) | ORAL | Status: DC | PRN

## 2012-09-20 MED ORDER — HEPARIN SOD (PORK) LOCK FLUSH 100 UNIT/ML IV SOLN
250.0000 [IU] | INTRAVENOUS | Status: DC | PRN
  Administered 2012-09-20: 250 [IU]
  Filled 2012-09-20: qty 3

## 2012-09-20 NOTE — Progress Notes (Signed)
Advanced Heart Failure Rounding Note   Subjective:    [name] is a 62 y.o.male is a 62 y.o. gentlemen with severe HF due to NICM (EF 20-25%) with multiple hospital admissions for HF exacerbations. He also has history of ventricular tachycardia s/p St Jude ICD by [name], chronic kidney disease: stage III, baseline Cr ~2, and paroxysmal atrial arrhythmias controlled by amiodarone. As well as sarcoidosis, hypertension. Cath in 2008 by Dr. [name] showed no CAD. Dry weight is 219 (11/2011). Milrinone initiated in January for low output.   He presented to the ED 09/19/12 for fatigue, palpitations, nausea and orthostasis.   Diuretics have been on hold. Weight today up 3 lbs. Denies SOB, orthopnea, or CP. Consult with Hospice team yesterday and he decided he would like their services at this time, but keep his ICD on currently.   Objective:   Weight Range:  Vital Signs:   Temp:  [97.4 F (36.3 C)-98.3 F (36.8 C)] 98.3 F (36.8 C) (09/19 0458) Pulse Rate:  [65-92] 65 (09/19 0458) Resp:  [18-19] 18 (09/19 0458) BP: (92-117)/(67-81) 104/72 mmHg (09/19 0458) SpO2:  [95 %-97 %] 95 % (09/19 0458) Weight:  [236 lb 8 oz (107.276 kg)] 236 lb 8 oz (107.276 kg) (09/19 0458)    Weight change: Filed Weights   09/19/12 0419 09/19/12 0508 09/20/12 0458  Weight: 233 lb 14.5 oz (106.1 kg) 231 lb 6.4 oz (104.962 kg) 236 lb 8 oz (107.276 kg)    Intake/Output:   Intake/Output Summary (Last 24 hours) at 09/20/12 1002 Last data filed at 09/20/12 0900  Gross per 24 hour  Intake   1200 ml  Output   1800 ml  Net   -600 ml     Physical Exam: General: Chronically ill appearing. No resp difficulty; lying in bed HEENT: normal Neck: supple. JVP 7 . Carotids 2+ bilat; no bruits. No lymphadenopathy or thryomegaly appreciated. Cor: PMI laterally displaced. Regular rate & rhythm. No rubs, gallops or murmurs. Lungs: clear Abdomen: soft, nontender, nondistended. No hepatosplenomegaly. No bruits or  masses. Good bowel sounds. Extremities: no cyanosis, clubbing, rash, edema Neuro: alert & orientedx3, cranial nerves grossly intact. moves all 4 extremities w/o difficulty. Affect pleasant  Telemetry: NSR   Labs: Basic Metabolic Panel:  Recent Labs Lab 09/19/12 0038 09/19/12 0640  NA 135 131*  K 3.5 3.1*  CL 93* 91*  CO2 26 26  GLUCOSE 183* 168*  BUN 78* 78*  CREATININE 3.78* 3.89*  CALCIUM 10.0 9.7    Liver Function Tests: No results found for this basename: AST, ALT, ALKPHOS, BILITOT, PROT, ALBUMIN,  in the last 168 hours No results found for this basename: LIPASE, AMYLASE,  in the last 168 hours No results found for this basename: AMMONIA,  in the last 168 hours  CBC:  Recent Labs Lab 09/19/12 0038 09/19/12 0640  WBC 8.5 6.9  NEUTROABS 4.7  --   HGB 15.4 14.2  HCT 42.0 40.4  MCV 88.2 89.4  PLT 227 191    Cardiac Enzymes:  Recent Labs Lab 09/19/12 0038  TROPONINI <0.30    BNP: BNP (last 3 results)  Recent Labs  02/03/12 0500 02/18/12 2210 09/19/12 0640  PROBNP 3722.0* 3490.0* 596.8*     Other results:    Imaging: Ct Head Wo Contrast  09/19/2012   CLINICAL DATA:  Chest pain  EXAM: CT HEAD WITHOUT CONTRAST  TECHNIQUE: Contiguous axial images were obtained from the base of the skull through the vertex without intravenous contrast.  COMPARISON:  10/07/2011.  FINDINGS: Skull:No acute osseous abnormality. No lytic or blastic lesion.  Orbits: No acute abnormality.  Brain: No evidence of acute abnormality, such as acute infarction, hemorrhage, hydrocephalus, or mass lesion/mass effect. There is been a remote cortical infarct involving the left occipital pole. Question mild, patchy bilateral cerebral white matter low-attenuation, compatible with chronic small vessel ischemia.  IMPRESSION: 1. No evidence of acute intracranial disease. 2. Remote, small left occipital cortical infarct.   Electronically Signed   By: Tiburcio Pea   On: 09/19/2012 01:07    Dg Chest Port 1 View  09/19/2012   CLINICAL DATA:  Chest pain. Shortness of breath.  EXAM: PORTABLE CHEST - 1 VIEW  COMPARISON:  02/18/2012.  FINDINGS: Single chamber left subclavian approach AICD/pacer in remarkable position. Right IJ central venous catheter, tip at the mid SVC.  Chronic cardiopericardial enlargement. Tortuous thoracic aorta, similar in appearance to radiograph 01/25/2012. No overt edema. No infiltrate, effusion, or pneumothorax. No acute osseous findings. Prior lower cervical ACDF with ventral plate and screw fixation.  IMPRESSION: Cardiomegaly without failure.   Electronically Signed   By: Tiburcio Pea   On: 09/19/2012 00:54      Medications:     Scheduled Medications: . allopurinol  100 mg Oral Daily  . amiodarone  200 mg Oral BID  . antiseptic oral rinse  15 mL Mouth Rinse BID  . apixaban  5 mg Oral BID  . atorvastatin  10 mg Oral q1800  . carvedilol  3.125 mg Oral BID WC  . feeding supplement  1 Container Oral TID BM  . influenza vac split quadrivalent PF  0.5 mL Intramuscular Tomorrow-1000  . pantoprazole  40 mg Oral Daily  . potassium chloride SA  40 mEq Oral BID  . sodium chloride  10-40 mL Intracatheter Q12H  . spironolactone  12.5 mg Oral Daily  . sulfamethoxazole-trimethoprim  1 tablet Oral Q12H     Infusions: . milrinone       PRN Medications:  acetaminophen, guaifenesin, HYDROcodone-acetaminophen, hydrOXYzine, LORazepam, nitroGLYCERIN, ondansetron (ZOFRAN) IV, sodium chloride   Assessment:   1) A/C systolic HF, NICM EF 20-25%      - on milrinone 2) PAF, in NSR on eliquis 3) Acute on chronic renal failure, Stage IV (baseline Cr 3.7-4.2)  Plan/Discussion:    Patient unfortunately has end-stage HF and is on home inotropes. Met with Hospice yesterday and would like to pursue these options. At this time he would like to be a full code including intubation and keeping his ICD on. We will continue to address these topics on an outpatient  basis.   His weight is up 3 lbs, will restart home diuretics. He is stable and will be discharged home today with follow up in HF clinic.   Length of Stay: 1 [name] 09/20/2012, 10:02 AM  Advanced Heart Failure Team Pager [phone] (M-F; 7a - 4p)  Please contact [facility] for night-coverage after hours (4p -7a ) and weekends on [email or web address]  Patient seen and examined with [name], NP. We discussed all aspects of the encounter. I agree with the assessment and plan as stated above. He is stable for d/c. Agree with hospice referral. Will continue to follow in HF clinic.  Darrell Beumer,MD 1:32 PM

## 2012-09-20 NOTE — Discharge Summary (Signed)
Advanced Heart Failure Team  Discharge Summary   Patient ID: [name] MRN: [id number], DOB/AGE: December 24, 1950 62 y.o. Admit date: 09/19/2012 D/C date:     09/20/2012   Primary Discharge Diagnoses:  1) A/C systolic HF, NICM EF 20-25%      - on milrinone, Stage IV  Secondary Discharge Diagnoses:  1) PAF, in NSR on Eliquis 2) Acute on chronic renal failure, Stage IV (baseline Cr 3.7-4.2)  Hospital Course:  [name] is a 62 y.o.male with severe HF due to NICM (EF 20-25%) with multiple hospital admissions for HF exacerbations. He also has history of ventricular tachycardia s/p St Jude ICD by Dr. Ladona [name], chronic kidney disease: stage IV, baseline Cr ~ 3.7-4.2, and paroxysmal atrial arrhythmias controlled by amiodarone. As well as sarcoidosis and hypertension. Milrinone initiated in January for low output.   He is followed closely in the HF clinic and presented to the ED on 09/19/12 for increased fatigue, palpitations, nausea, and orthostasis. On admission pertinent labs were pBNP 596, Cr 3.89, K+ 3.1, and Na 131. He had a PICC line discontinued before admission d/t possible infection, however growth was negative x 5 days. His new IJ tunneled PICC showed no signs of infection and he denied fevers/chills. His WBC stable 6.9. His diuretics were held for 1 day and was restarted on his home dose demadex 40 mg am/ 20 mg pm today. His Eliquis was also increased to 5 mg BID d/t he did not meet two criteria to be on 2.5 BID. During his stay Palliative Care/Hospice was consulted. [name] met criteria for Hospice services and he has decided to proceed with their help in his management of his heart failure. At this time he would like to remain full code with his ICD turned on. We will continue to address these issues on an outpatient basis.  Today he reports he feels better and denies any SOB, orthopnea, dizziness, nausea or CP. We will discharge him home back on his home diuretics and follow up next week  in HF clinic. His discharge weight is 236 lbs. He will need a follow up BMET next week.   Discharge Weight Range: 235-240 lbs Discharge Vitals: Blood pressure 104/72, pulse 65, temperature 98.3 F (36.8 C), temperature source Oral, resp. rate 18, height 6\' 3"  (1.905 m), weight 236 lb 8 oz (107.276 kg), SpO2 95.00%.  Labs: Lab Results  Component Value Date   WBC 6.9 09/19/2012   HGB 14.2 09/19/2012   HCT 40.4 09/19/2012   MCV 89.4 09/19/2012   PLT 191 09/19/2012    Recent Labs Lab 09/19/12 0640  NA 131*  K 3.1*  CL 91*  CO2 26  BUN 78*  CREATININE 3.89*  CALCIUM 9.7  GLUCOSE 168*   Lab Results  Component Value Date   CHOL 144 10/09/2011   HDL 70 10/09/2011   LDLCALC 55 10/09/2011   TRIG 96 10/09/2011   BNP (last 3 results)  Recent Labs  02/03/12 0500 02/18/12 2210 09/19/12 0640  PROBNP 3722.0* 3490.0* 596.8*    Diagnostic Studies/Procedures   Ct Head Wo Contrast  09/19/2012   CLINICAL DATA:  Chest pain  EXAM: CT HEAD WITHOUT CONTRAST  TECHNIQUE: Contiguous axial images were obtained from the base of the skull through the vertex without intravenous contrast.  COMPARISON:  10/07/2011.  FINDINGS: Skull:No acute osseous abnormality. No lytic or blastic lesion.  Orbits: No acute abnormality.  Brain: No evidence of acute abnormality, such as acute infarction, hemorrhage, hydrocephalus, or mass lesion/mass  effect. There is been a remote cortical infarct involving the left occipital pole. Question mild, patchy bilateral cerebral white matter low-attenuation, compatible with chronic small vessel ischemia.  IMPRESSION: 1. No evidence of acute intracranial disease. 2. Remote, small left occipital cortical infarct.   Electronically Signed   By: Tiburcio Pea   On: 09/19/2012 01:07   Dg Chest Port 1 View  09/19/2012   CLINICAL DATA:  Chest pain. Shortness of breath.  EXAM: PORTABLE CHEST - 1 VIEW  COMPARISON:  02/18/2012.  FINDINGS: Single chamber left subclavian approach AICD/pacer in  remarkable position. Right IJ central venous catheter, tip at the mid SVC.  Chronic cardiopericardial enlargement. Tortuous thoracic aorta, similar in appearance to radiograph 01/25/2012. No overt edema. No infiltrate, effusion, or pneumothorax. No acute osseous findings. Prior lower cervical ACDF with ventral plate and screw fixation.  IMPRESSION: Cardiomegaly without failure.   Electronically Signed   By: Tiburcio Pea   On: 09/19/2012 00:54    Discharge Medications     Medication List    ASK your doctor about these medications       allopurinol 100 MG tablet  Commonly known as:  ZYLOPRIM  Take 1 tablet (100 mg total) by mouth daily.     amiodarone 200 MG tablet  Commonly known as:  PACERONE  Take 200 mg by mouth 2 (two) times daily.     apixaban 5 MG Tabs tablet  Commonly known as:  ELIQUIS  Take 0.5 tablets (2.5 mg total) by mouth 2 (two) times daily.     carvedilol 3.125 MG tablet  Commonly known as:  COREG  Take 3.125 mg by mouth 2 (two) times daily with a meal.     esomeprazole 40 MG capsule  Commonly known as:  NEXIUM  Take 40 mg by mouth daily as needed (for acid reflux).     famotidine 20 MG tablet  Commonly known as:  PEPCID  Take 20 mg by mouth 2 (two) times daily.     HYDROcodone-acetaminophen 5-325 MG per tablet  Commonly known as:  NORCO/VICODIN  Take 1 tablet by mouth every 6 (six) hours as needed for pain.     hydrOXYzine 10 MG tablet  Commonly known as:  ATARAX/VISTARIL  Take 1 tablet (10 mg total) by mouth 3 (three) times daily as needed for itching.     LORazepam 0.5 MG tablet  Commonly known as:  ATIVAN  Take 0.5 mg by mouth 2 (two) times daily as needed for anxiety.     metolazone 2.5 MG tablet  Commonly known as:  ZAROXOLYN  Take 1 tablet (2.5 mg total) by mouth See admin instructions. Patient takes as directed by MD for fluid retention     milrinone 20 MG/100ML Soln infusion  Commonly known as:  PRIMACOR  Inject 37.3125 mcg/min into the  vein continuous.     potassium chloride SA 20 MEQ tablet  Commonly known as:  K-DUR,KLOR-CON  Take 2 tablets (40 mEq total) by mouth 2 (two) times daily.     rosuvastatin 5 MG tablet  Commonly known as:  CRESTOR  Take 1 tablet (5 mg total) by mouth at bedtime.     sildenafil 100 MG tablet  Commonly known as:  VIAGRA  Take 1 tablet (100 mg total) by mouth daily as needed for erectile dysfunction.     spironolactone 25 MG tablet  Commonly known as:  ALDACTONE  Take 0.5 tablets (12.5 mg total) by mouth daily.     sulfamethoxazole-trimethoprim 800-160  MG per tablet  Commonly known as:  BACTRIM DS,SEPTRA DS  Take 1 tablet by mouth 2 (two) times daily. For 10 days. Started 09/14/12     torsemide 20 MG tablet  Commonly known as:  DEMADEX  Take 40 mg in AM and 20 mg in PM        Disposition   The patient will be discharged in stable condition to home.      Future Appointments Provider Department Dept Phone   10/09/2012 9:40 AM [facility] [phone]         Duration of Discharge Encounter: Greater than 35 minutes   Signed, Aundria Rud  09/20/2012, 10:20 AM  Patient seen and examined with [name], NP. We discussed all aspects of the encounter. I agree with the assessment and plan as stated above. He is improved. Agree with Hospice referral. Will continue to follow closely in HF Clinic. D/c home today.   Symantha Steeber,MD 1:37 PM

## 2012-09-20 NOTE — Progress Notes (Signed)
[facility]  Patient Status: Pt is active with [facility] up to [facility] readmission for [facility] nursing and Pharmacy for home Milrinone. Note below for [facility] for clarity of plan for [facility] visit at home tonight. Discussed directly already with Valente David, RN, Hospital Coordinator with [facility].   Hospice RN to make [facility] tonight for admission.   Pts CADD pump has Reservoir Volume at 12:30 PM today of 163 ml, 71 hour supply. Discussed with  Delray Alt and we  agree RN will change bag on Sunday.   Pt already has a new bag in the home delivered on 09/19/12.   Pt will still be able to travel to [facility] when desired as long as Hospice and [facility] are aware in advance.   Pt is independent with and  will continue to flush his unused lumen with heparin daily.  RN will add extension to unused lumen on visit tonight.  Pt admits to pulling on chest PICC to see well enough to flush.  If patient discharges after hours, please call [phone].   Sedalia Muta 09/20/2012, 1:12 PM

## 2012-09-20 NOTE — Progress Notes (Addendum)
Notified by [facility], patient and family request services of Hospcie and Palliative Care of [facility]) after discharge.  Patient information reviewed with Dr Elliot Gurney, [facility] Medical Director hospice eligible with dx: ESCHF . Spoke with patient at bedside to initiate education related to hospice services, philosophy and team approach to care he voiced good understanding of information provided. Per discussion with patient and primary team patient patient will discharge on Milrinone at 0.375 mcg/kg/min 11.9 ml/hr -he currently has this drip via CADD pump in place infusing through R IJ PICC  - Patient is being followed by [facility] for Milrinone drip and representative Pam aware [facility] will also be involved after discharge Per notes plan is to d/c later today- Mr Cathey indicated he would call a friend to pick him up and transport via personal vehicle  DME needs discussed patient has O2 at home and uses 2-3 LNC as needed; patient  voiced no other equipment needs at this time   Patient's PCP Dr Gala Romney will attend with [facility]; pharmacy [facility] [address] Initial paperwork faxed to [facility] Referral Center    Completed d/c summary will need to be faxed to [facility] Referral Center @ [phone] when final Please notify [facility] when patient is ready to leave unit at d/c call [phone] (or [phone] if after 5 pm);  [facility] information and contact numbers also given to patient during visit.   Above information shared with [name] [facility] Please call with any questions or concerns   Valente David, RN 09/20/2012, 9:54 AM [facility] and [facility] Palliative Medicine Team RN Liaison [phone]

## 2012-09-21 ENCOUNTER — Telehealth: Payer: Self-pay | Admitting: Nurse Practitioner

## 2012-09-21 NOTE — Telephone Encounter (Signed)
Hospice RN called stating that pt is at home with tunneled central line (placed 9/15) for milrinone infusion.  He has been having a fair amt of bleeding/oozing around that site since yesterday.  I reviewed his history.  He is on eliquis for afib.  He has no prior h/o stroke according to PMH in Epic. I advised to hold Eliquis over the weekend and resume on Tuesday morning and to call back if bleeding persists despite holding Eliquis.  RN verbalized understanding.

## 2012-09-27 ENCOUNTER — Telehealth (HOSPITAL_COMMUNITY): Payer: Self-pay | Admitting: *Deleted

## 2012-09-27 ENCOUNTER — Ambulatory Visit (HOSPITAL_COMMUNITY)
Admission: RE | Admit: 2012-09-27 | Discharge: 2012-09-27 | Disposition: A | Discharge status: Home / Self Care | Payer: Medicare Other | Source: Ambulatory Visit | Attending: Internal Medicine | Admitting: Internal Medicine

## 2012-09-27 VITALS — BP 86/58 | HR 92 | Wt 239.8 lb

## 2012-09-27 DIAGNOSIS — I509 Heart failure, unspecified: Secondary | ICD-10-CM | POA: Diagnosis not present

## 2012-09-27 DIAGNOSIS — I5022 Chronic systolic (congestive) heart failure: Secondary | ICD-10-CM

## 2012-09-27 DIAGNOSIS — N189 Chronic kidney disease, unspecified: Secondary | ICD-10-CM

## 2012-09-27 DIAGNOSIS — I4892 Unspecified atrial flutter: Secondary | ICD-10-CM

## 2012-09-27 LAB — BASIC METABOLIC PANEL
BUN: 54 mg/dL — ABNORMAL HIGH (ref 6–23)
GFR calc non Af Amer: 23 mL/min — ABNORMAL LOW (ref >90)
Glucose, Bld: 124 mg/dL — ABNORMAL HIGH (ref 70–99)
Potassium: 3.4 mEq/L — ABNORMAL LOW (ref 3.5–5.1)

## 2012-09-27 MED ORDER — SPIRONOLACTONE 25 MG PO TABS: 12.5000 mg | ORAL_TABLET | Freq: Every day | ORAL | Status: DC

## 2012-09-27 MED ORDER — AMIODARONE HCL 200 MG PO TABS: 400.0000 mg | ORAL_TABLET | Freq: Two times a day (BID) | ORAL | Status: DC

## 2012-09-27 NOTE — Progress Notes (Signed)
Patient ID: WEN MERCED, male   DOB: 1950-12-31, 62 y.o.   MRN: [id number]   Weight Range    219-221 pounds  Baseline proBNP    511 on 11/13/11   HPI: Mr. Tolson is a 62 y.o. gentlemen with severe HF due to NICM (EF 20-25%) with multiple hospital admissions for HF exacerbations. He also has history of ventricular tachycardia s/p St Jude ICD by [name], CKD: stage IV, baseline Cr ~2, and paroxysmal atrial arrhythmias on amiodarone as well as sarcoidosis and hypertension. Cath in 2008 by Dr. [name] showed no CAD.Seen at [facility] and not felt to be a transplant candidate due to renal failure and lack of family support.  Milrinone initiated in January 2014 for low output. 03/01/12 milrinone increased 0.351mc/kg/min.   RHC 01/26/12  RA = 14  RV = 51/16/15  PA = 57/34 (43)  PCW = 26  Fick cardiac output/index = 4.2/1.8  Thermo CO/CI = 3.9/1.7  PVR = 4.0 [place]  O2 sat = 97%  PA sat = 57%, 58%  Ao Pressure (non-invasive): 116/80 (93)  SVR = 1504   ECHO 03/22/12 EF 15%   04/26/12 S/P Successful DC-CV for atrial fibrillation. Remains on amiodarone.   04/15/12 Creatinine 2.3 Potassium 4.4 04/22/12 Creatinine 2.55 06/03/12 Creatinine 3.26 Potassium 2.8 06/17/12 Creatinine 2.76 Potassium 3.9 07/08/12 Creatinine 3.15 Potassium 3.3 07/15/12 Creatinine 3.21  Potassium 3.6 07/22/12 Creatinine 2.6 Potassium 3.5  08/12/12 Creatinine not correct 1.10, Potassium 3.1?  08/15/12 Creatinine 4.24 Potassium 3.3 09/19/12 Creatinine 3.98, Potassium 3.1, BUN 78   Follow up: Since last visit was admitted 9/18 for nausea, increased fatigue and palpitations. He was orthostatic and felt to be dry and he received fluids. During his stay we increased his Eliquis to 5 mg BID and he decided that he wanted [facility] to help in his care. His ICD remains on and he is full code at this time. Feels that his heart fluttering a lot. Says it comes and goes about 2-3x per day for 30-45 mins at a time. Otherwise feeling  pretty good. Weight up a couple pounds. On torsemide 40/20. BP remains low. Dizziness improving.    He is not on an ace inhibitor/spironolactone due to elevated creatinine. Not on nitrates due to Viagra use.   ROS: All systems negative except as listed in HPI, PMH and Problem List.  Past Medical History  Diagnosis Date  . CHF (congestive heart failure)   . Sarcoidosis   . Cardiomyopathy, dilated, nonischemic     non ischemic by cath  . Acute on chronic systolic heart failure   . Automatic implantable cardiac defibrillator in situ   . Atrial fibrillation   . NSVT (nonsustained ventricular tachycardia)   . GERD (gastroesophageal reflux disease)   . Hypercholesteremia   . Myocardial infarction   . Shortness of breath   . Chronic kidney disease (CKD), stage III (moderate)   . Pacemaker   . Anginal pain   . Gout   . Hypertension     [name]  . Coronary artery disease    Current Outpatient Prescriptions  Medication Sig Dispense Refill  . allopurinol (ZYLOPRIM) 100 MG tablet Take 1 tablet (100 mg total) by mouth daily.  30 tablet  6  . amiodarone (PACERONE) 200 MG tablet Take 200 mg by mouth 2 (two) times daily.      Marland Kitchen apixaban (ELIQUIS) 5 MG TABS tablet Take 1 tablet (5 mg total) by mouth 2 (two) times daily.  60  tablet  6  . carvedilol (COREG) 3.125 MG tablet Take 3.125 mg by mouth 2 (two) times daily with a meal.      . esomeprazole (NEXIUM) 40 MG capsule Take 40 mg by mouth daily as needed (for acid reflux).       Marland Kitchen guaifenesin (ROBITUSSIN) 100 MG/5ML syrup Take 10 mLs (200 mg total) by mouth every 4 (four) hours as needed for congestion.  120 mL  3  . HYDROcodone-acetaminophen (NORCO/VICODIN) 5-325 MG per tablet Take 1-2 tablets by mouth every 6 (six) hours as needed.  45 tablet  0  . LORazepam (ATIVAN) 0.5 MG tablet Take 0.5 mg by mouth 2 (two) times daily as needed for anxiety.      . metolazone (ZAROXOLYN) 2.5 MG tablet Take 1 tablet (2.5 mg total) by mouth See admin  instructions. Patient takes as directed by MD for fluid retention  10 tablet  3  . milrinone (PRIMACOR) 20 MG/100ML SOLN infusion Inject 37.3125 mcg/min into the vein continuous.  100 mL    . potassium chloride SA (K-DUR,KLOR-CON) 20 MEQ tablet Take 2 tablets (40 mEq total) by mouth 2 (two) times daily.  120 tablet  3  . rosuvastatin (CRESTOR) 5 MG tablet Take 1 tablet (5 mg total) by mouth at bedtime.      . sildenafil (VIAGRA) 100 MG tablet Take 1 tablet (100 mg total) by mouth daily as needed for erectile dysfunction.  4 tablet  1  . torsemide (DEMADEX) 20 MG tablet Take 40 mg in AM and 20 mg in PM       No current facility-administered medications for this encounter.   PHYSICAL EXAM: Filed Vitals:   09/27/12 0854  BP: 86/58  Pulse: 92  Weight: 239 lb 12 oz (108.75 kg)  SpO2: 98%   General: Well appearing. NAD   HEENT: normal  Neck: supple. JVP 6-7. Carotids 2+ bilat; no bruits. No lymphadenopathy or thryomegaly appreciated.  Cor: PMI laterally displaced. regular S1S2 with +S3 and 2/6 HSM apex. Dried blood around hickman site Lungs: clear Abdomen: soft, nontender, obese, + distended, No bruits or masses. Good bowel sounds.  Extremities: no cyanosis, clubbing, rash, no lower extremity edema  Neuro: alert & orientedx3, cranial nerves grossly intact. moves all 4 extremities w/o difficulty. Affect pleasant    ASSESSMENT & PLAN:   1) Chronic systolic HF: Nonischemic cardiomyopathy with EF 15% (03/2012); Now with Hospice - NYHA IIIB.  Evaluated at [facility] and he was not a candidate for advanced therapies due to CKD and lack of social support.  - Continue milrinone at 0.375 mcg. Continue [facility] for home milrinone and picc.  - Continue spiro due to h/o severe hypokalemia - Continue Hospice - Check BMET today   2) Aflutter:  - Now in SR. On amio and apixaban but having frequent prolonged palpitation. Will interrogate device today. If high burden will increase amio to 400 bid.      3) CKD:   - BMET results pending.   Daniel Bensimhon,MD 9:24 AM  Addendum: ICD interrogation done in person with device rep shows multiple episodes of what appear to be AFL (he does not have atrial lead so hard to confirm). Will increase amio to 400 bid for a month to see if this limits burden.  Reuel Boom Bensimhon,MD 9:42 PM

## 2012-09-27 NOTE — Telephone Encounter (Signed)
Message copied by Noralee Space on Fri Sep 27, 2012  1:32 PM ------      Message from: Dolores Patty      Created: Fri Sep 27, 2012 10:38 AM       k low restart spiro ------

## 2012-09-27 NOTE — Patient Instructions (Addendum)
Increase Amiodarone to 400 mg (2 tabs) Twice daily   Will call you with lab results  Your physician recommends that you schedule a follow-up appointment in: 3-4 weeks

## 2012-09-30 ENCOUNTER — Encounter: Payer: Self-pay | Admitting: Internal Medicine

## 2012-09-30 ENCOUNTER — Telehealth: Payer: Self-pay | Admitting: Physician Assistant

## 2012-09-30 ENCOUNTER — Encounter: Payer: Self-pay | Admitting: Physician Assistant

## 2012-09-30 NOTE — Telephone Encounter (Signed)
[facility] called because [name] was complaining of pain at the catheter site when she cleaned it today. She drew a CBC/diff but no results are available. There is no redness, swelling or drainage at the site. He has not been running fevers. Per DB note 9/26, area is scabbed but no problems. He feels tired but that may be from the intermittent atrial flutter/fib he is having. No palpitations right now. She did not see him until late because he was at Dr. Annitta Jersey office today. No mention from patient that he told Dr. [name] of any pain at site.  Requested she check on him again tomorrow. Once CBC data are reviewed and she sees him, we will know more about the site. If he is having drainage, the CBC is abnormal or he begins running fevers, he may need to be admitted.   Otherwise, he should call for symptoms.

## 2012-10-01 ENCOUNTER — Telehealth (HOSPITAL_COMMUNITY): Payer: Self-pay | Admitting: *Deleted

## 2012-10-01 DIAGNOSIS — I5022 Chronic systolic (congestive) heart failure: Secondary | ICD-10-CM

## 2012-10-01 NOTE — Telephone Encounter (Signed)
Received call from [facility], RN with Hospice she is concerned about pt's central catheter, she states it is swollen around site and painful, pt states it even hurts if he turns his head, she reports he is grimacing with pain when she cleaned site, pt unable to come to radiology for eval today due to transportation, have sch appt w/Dr Lowella Dandy tomorrow at 10:45, pt is aware and states he is ok, if gets worse will go to ER overnight

## 2012-10-02 ENCOUNTER — Ambulatory Visit
Admission: RE | Admit: 2012-10-02 | Discharge: 2012-10-02 | Disposition: A | Discharge status: Home / Self Care | Payer: Medicare Other | Source: Ambulatory Visit | Attending: Internal Medicine | Admitting: Internal Medicine

## 2012-10-02 DIAGNOSIS — I5022 Chronic systolic (congestive) heart failure: Secondary | ICD-10-CM

## 2012-10-03 ENCOUNTER — Other Ambulatory Visit (HOSPITAL_COMMUNITY): Payer: Self-pay | Admitting: *Deleted

## 2012-10-03 ENCOUNTER — Encounter: Payer: Self-pay | Admitting: Internal Medicine

## 2012-10-03 MED ORDER — HYDROXYZINE HCL 10 MG PO TABS: 10.0000 mg | ORAL_TABLET | Freq: Three times a day (TID) | ORAL | Status: DC | PRN

## 2012-10-03 NOTE — Telephone Encounter (Signed)
Pam, RN with hospice called requesting refill on pt's hydroxyzine for itching, she states he had it previously and has ran out, ok per Dr Gala Romney to refill, rx sent in pt aware

## 2012-10-05 ENCOUNTER — Encounter: Payer: Self-pay | Admitting: Internal Medicine

## 2012-10-06 IMAGING — CR DG CHEST 2V
2 series · 2 of 2 positions shown · non-contrast
Comparison: 02/19/2011

CLINICAL DATA: Chest pain

CHEST - 2 VIEW

[w chest pa]
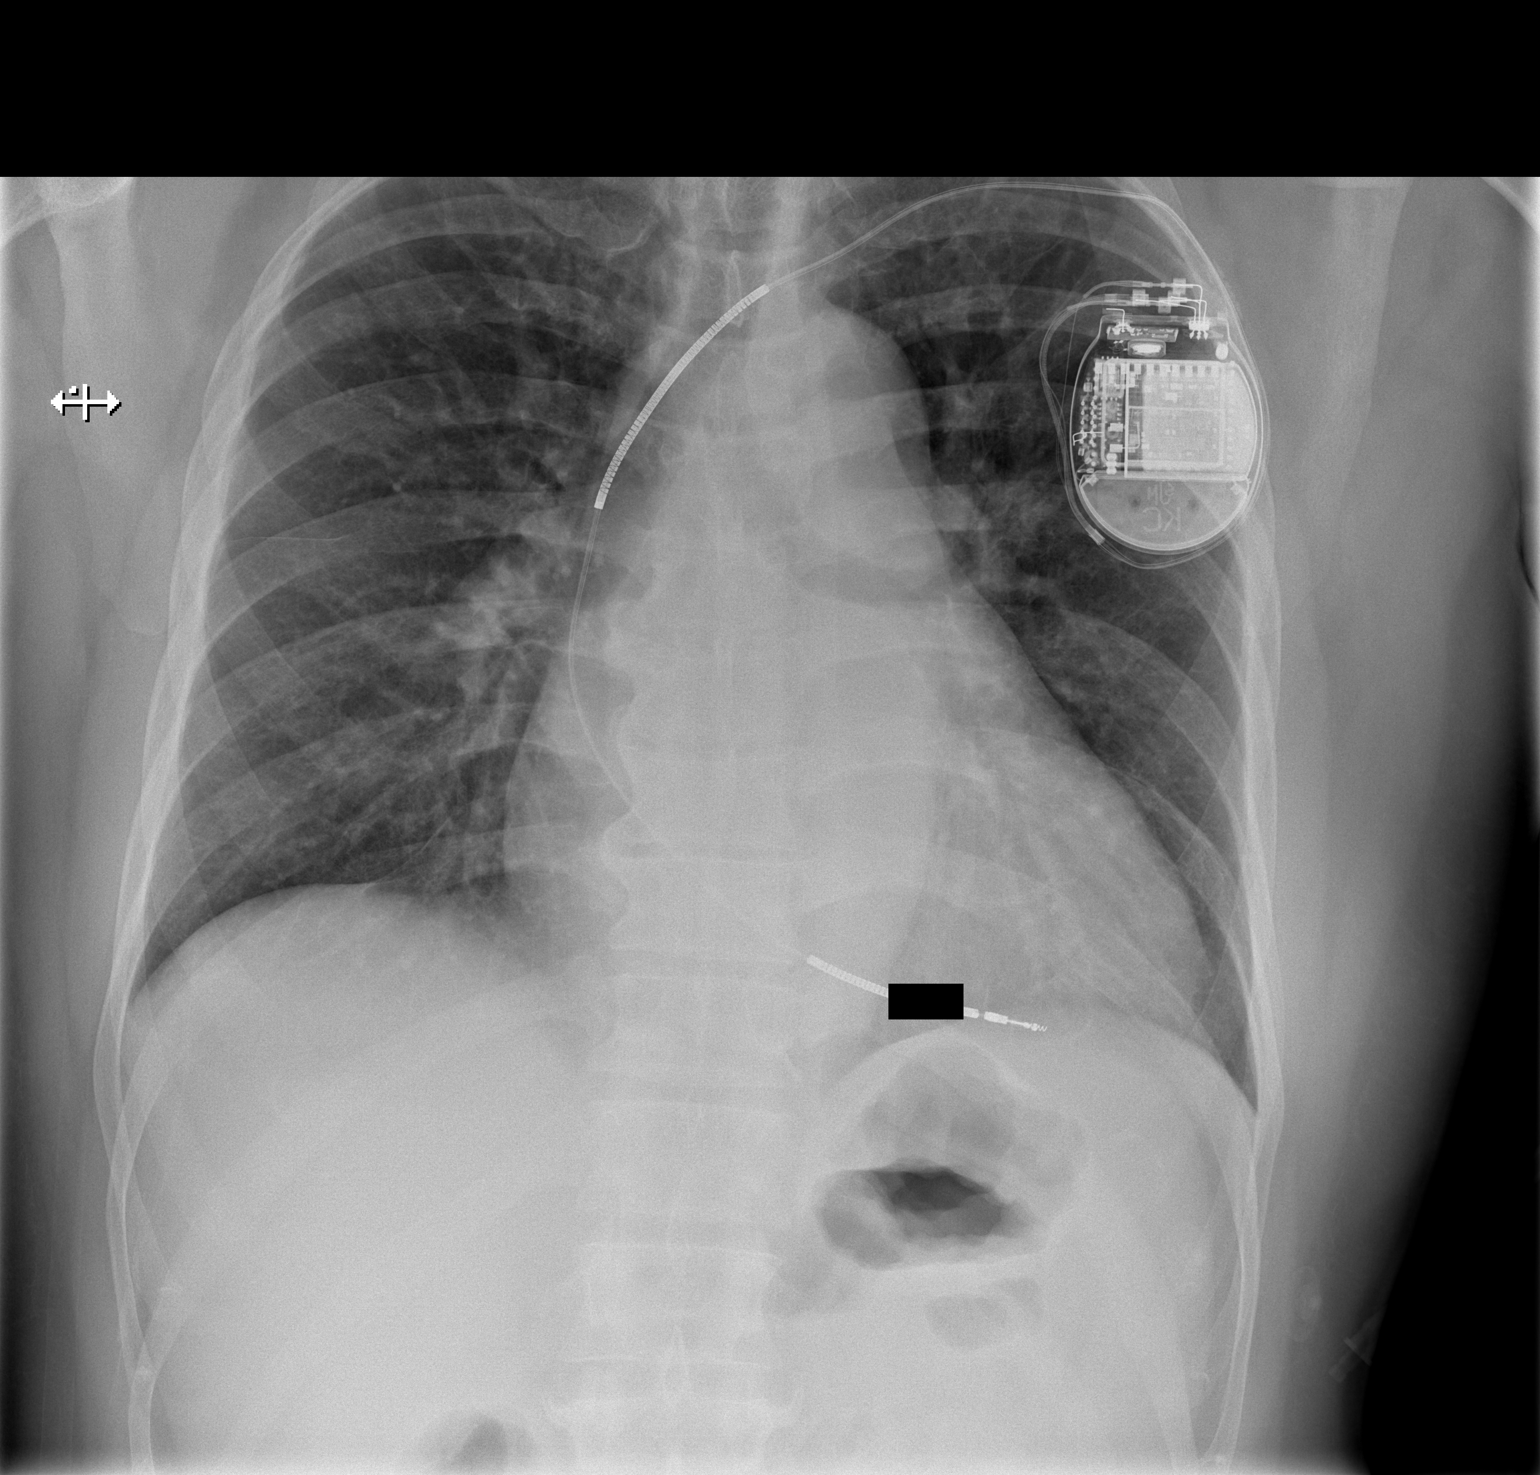

[w chest lat]
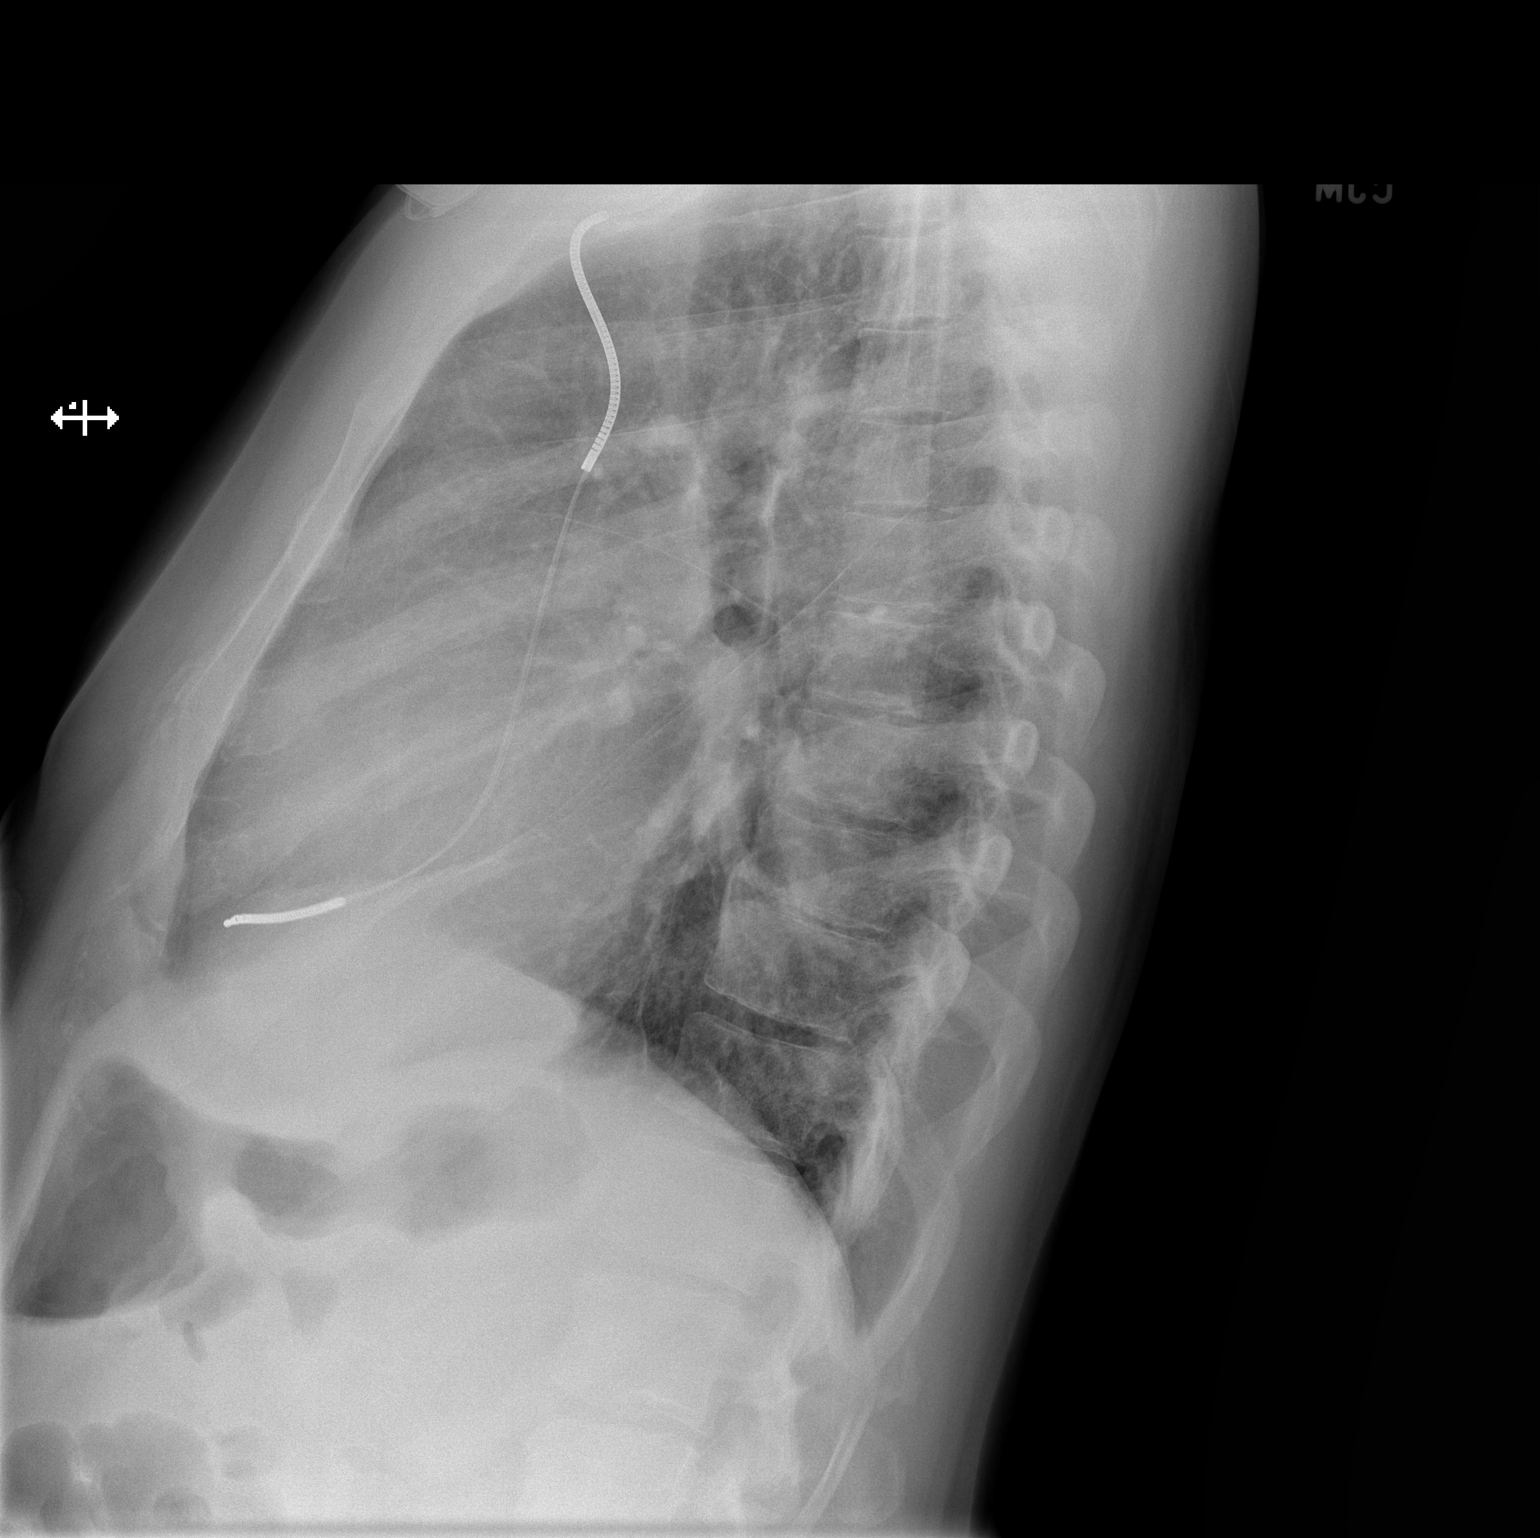

[2 of 2 positions shown; findings below may reference images not displayed]

FINDINGS: Left chest wall AICD is noted with lead in the right
ventricle.

The heart size appears mildly enlarged.

No pleural effusion or edema identified.

No airspace consolidation identified.
IMPRESSION: 1.  No active cardiopulmonary abnormality.
2.  Mild cardiac enlargement.

## 2012-10-09 ENCOUNTER — Encounter (HOSPITAL_COMMUNITY): Payer: Self-pay

## 2012-10-09 ENCOUNTER — Ambulatory Visit (HOSPITAL_COMMUNITY)
Admission: RE | Admit: 2012-10-09 | Discharge: 2012-10-09 | Disposition: A | Discharge status: Home / Self Care | Source: Ambulatory Visit | Attending: Cardiology | Admitting: Cardiology

## 2012-10-09 VITALS — BP 88/64 | HR 94 | Wt 237.8 lb

## 2012-10-09 DIAGNOSIS — D869 Sarcoidosis, unspecified: Secondary | ICD-10-CM | POA: Diagnosis not present

## 2012-10-09 DIAGNOSIS — N189 Chronic kidney disease, unspecified: Secondary | ICD-10-CM

## 2012-10-09 DIAGNOSIS — Z95810 Presence of automatic (implantable) cardiac defibrillator: Secondary | ICD-10-CM | POA: Insufficient documentation

## 2012-10-09 DIAGNOSIS — Z79899 Other long term (current) drug therapy: Secondary | ICD-10-CM | POA: Insufficient documentation

## 2012-10-09 DIAGNOSIS — I129 Hypertensive chronic kidney disease with stage 1 through stage 4 chronic kidney disease, or unspecified chronic kidney disease: Secondary | ICD-10-CM | POA: Insufficient documentation

## 2012-10-09 DIAGNOSIS — I428 Other cardiomyopathies: Secondary | ICD-10-CM | POA: Insufficient documentation

## 2012-10-09 DIAGNOSIS — Z515 Encounter for palliative care: Secondary | ICD-10-CM | POA: Diagnosis not present

## 2012-10-09 DIAGNOSIS — I5022 Chronic systolic (congestive) heart failure: Secondary | ICD-10-CM | POA: Diagnosis present

## 2012-10-09 DIAGNOSIS — K219 Gastro-esophageal reflux disease without esophagitis: Secondary | ICD-10-CM | POA: Diagnosis not present

## 2012-10-09 DIAGNOSIS — M109 Gout, unspecified: Secondary | ICD-10-CM | POA: Diagnosis not present

## 2012-10-09 DIAGNOSIS — I252 Old myocardial infarction: Secondary | ICD-10-CM | POA: Diagnosis not present

## 2012-10-09 DIAGNOSIS — E7800 Pure hypercholesterolemia, unspecified: Secondary | ICD-10-CM | POA: Insufficient documentation

## 2012-10-09 DIAGNOSIS — I509 Heart failure, unspecified: Secondary | ICD-10-CM | POA: Diagnosis not present

## 2012-10-09 DIAGNOSIS — N183 Chronic kidney disease, stage 3 unspecified: Secondary | ICD-10-CM | POA: Insufficient documentation

## 2012-10-09 DIAGNOSIS — I4891 Unspecified atrial fibrillation: Secondary | ICD-10-CM | POA: Diagnosis not present

## 2012-10-09 MED ORDER — POTASSIUM CHLORIDE CRYS ER 20 MEQ PO TBCR: 60.0000 meq | EXTENDED_RELEASE_TABLET | Freq: Two times a day (BID) | ORAL | Status: DC

## 2012-10-09 MED ORDER — AMIODARONE HCL 200 MG PO TABS: 200.0000 mg | ORAL_TABLET | Freq: Two times a day (BID) | ORAL | Status: DC

## 2012-10-09 MED ORDER — APIXABAN 5 MG PO TABS: 5.0000 mg | ORAL_TABLET | Freq: Two times a day (BID) | ORAL | Status: DC

## 2012-10-09 NOTE — Patient Instructions (Addendum)
Decreased Amiodarone to 200 mg twice a day.  Increase Potassium to 60 meq (3 tablets) twice a day.  Increase Eliquis to 5 mg BID.  Call any issues.  Will see in 3 weeks.   Do the following things EVERYDAY: 1) Weigh yourself in the morning before breakfast. Write it down and keep it in a log. 2) Take your medicines as prescribed 3) Eat low salt foods-Limit salt (sodium) to 2000 mg per day.  4) Stay as active as you can everyday 5) Limit all fluids for the day to less than 2 liters

## 2012-10-09 NOTE — Progress Notes (Signed)
Patient ID: [name], male   DOB: 05-10-1950, 62 y.o.   MRN: [id number]  Weight Range    219-221 pounds  Baseline proBNP    511 on 11/13/11   HPI: [name] is a 62 y.o. gentlemen with severe HF due to NICM (EF 20-25%) with multiple hospital admissions for HF exacerbations. He also has history of ventricular tachycardia s/p St Jude ICD by [name], CKD: stage IV, baseline Cr ~2, and paroxysmal atrial arrhythmias on amiodarone as well as sarcoidosis and hypertension. Cath in 2008 by Dr. [name] showed no CAD.Seen at [facility] and not felt to be a transplant candidate due to renal failure and lack of family support.  Milrinone initiated in January 2014 for low output. 03/01/12 milrinone increased 0.366mc/kg/min.   RHC 01/26/12  RA = 14  RV = 51/16/15  PA = 57/34 (43)  PCW = 26  Fick cardiac output/index = 4.2/1.8  Thermo CO/CI = 3.9/1.7  PVR = 4.0 Woods  O2 sat = 97%  PA sat = 57%, 58%  Ao Pressure (non-invasive): 116/80 (93)  SVR = 1504   ECHO 03/22/12 EF 15%   04/26/12 S/P Successful DC-CV for atrial fibrillation. Remains on amiodarone.   07/08/12 Creatinine 3.15 Potassium 3.3 07/15/12 Creatinine 3.21  Potassium 3.6 07/22/12 Creatinine 2.6 Potassium 3.5  08/12/12 Creatinine not correct 1.10, Potassium 3.1?  08/15/12 Creatinine 4.24 Potassium 3.3 09/19/12 Creatinine 3.98, Potassium 3.1, BUN 78 10/07/12 Creatinine 2.62, Potassium 3.4  Follow up: Last visit increased amiodarone to 400 mg BID. Reports he does not feel good for the past few days. + bloating and minimal appetite. +Weakness and reports being sluggish. Weight at home 234 lbs. Here, weight is down 2 lbs since last appointment.  Denies SOB, orthopnea or CP. +SOB when heart is fluttering, which is everyday lasts for about 5 min. The tachypalpitations have not been improved by increasing amiodarone.  Going to [facility] for 3 weeks tomorrow.   He is not on an ace inhibitor due to elevated creatinine. Not on nitrates due to  Viagra use.   ROS: All systems negative except as listed in HPI, PMH and Problem List.  Past Medical History  Diagnosis Date  . CHF (congestive heart failure)   . Sarcoidosis   . Cardiomyopathy, dilated, nonischemic     non ischemic by cath  . Acute on chronic systolic heart failure   . Automatic implantable cardiac defibrillator in situ   . Atrial fibrillation   . NSVT (nonsustained ventricular tachycardia)   . GERD (gastroesophageal reflux disease)   . Hypercholesteremia   . Myocardial infarction   . Shortness of breath   . Chronic kidney disease (CKD), stage III (moderate)   . Pacemaker   . Anginal pain   . Gout   . Hypertension     [name]  . Coronary artery disease    Current Outpatient Prescriptions  Medication Sig Dispense Refill  . allopurinol (ZYLOPRIM) 100 MG tablet Take 1 tablet (100 mg total) by mouth daily.  30 tablet  6  . apixaban (ELIQUIS) 5 MG TABS tablet Take 5 mg by mouth daily.      . carvedilol (COREG) 3.125 MG tablet Take 3.125 mg by mouth 2 (two) times daily with a meal.      . esomeprazole (NEXIUM) 40 MG capsule Take 40 mg by mouth daily as needed (for acid reflux).       Marland Kitchen HYDROcodone-acetaminophen (NORCO/VICODIN) 5-325 MG per tablet Take 1-2 tablets by mouth every  6 (six) hours as needed.  45 tablet  0  . hydrOXYzine (ATARAX/VISTARIL) 10 MG tablet Take 1 tablet (10 mg total) by mouth 3 (three) times daily as needed for itching.  60 tablet  3  . LORazepam (ATIVAN) 0.5 MG tablet Take 0.5 mg by mouth 2 (two) times daily as needed for anxiety.      . metolazone (ZAROXOLYN) 2.5 MG tablet Take 1 tablet (2.5 mg total) by mouth See admin instructions. Patient takes as directed by MD for fluid retention  10 tablet  3  . milrinone (PRIMACOR) 20 MG/100ML SOLN infusion Inject 37.3125 mcg/min into the vein continuous.  100 mL    . potassium chloride SA (K-DUR,KLOR-CON) 20 MEQ tablet Take 2 tablets (40 mEq total) by mouth 2 (two) times daily.  120 tablet  3  .  sildenafil (VIAGRA) 100 MG tablet Take 1 tablet (100 mg total) by mouth daily as needed for erectile dysfunction.  4 tablet  1  . spironolactone (ALDACTONE) 25 MG tablet Take 0.5 tablets (12.5 mg total) by mouth daily.  90 tablet  3  . torsemide (DEMADEX) 20 MG tablet Take 40 mg in AM and 20 mg in PM      . amiodarone (PACERONE) 200 MG tablet Take 2 tablets (400 mg total) by mouth 2 (two) times daily.  120 tablet  3   No current facility-administered medications for this encounter.   PHYSICAL EXAM: Filed Vitals:   10/09/12 0936  BP: 88/64  Pulse: 94  Weight: 237 lb 12.8 oz (107.865 kg)  SpO2: 97%   General: Well appearing. NAD   HEENT: normal  Neck: supple. JVP 7. Carotids 2+ bilat; no bruits. No lymphadenopathy or thryomegaly appreciated.  Cor: PMI laterally displaced. regular S1S2 with +S3 and 2/6 HSM apex. Dried blood around hickman site Lungs: clear Abdomen: soft, nontender, obese, + distended, No bruits or masses. Good bowel sounds.  Extremities: no cyanosis, clubbing, rash, no lower extremity edema  Neuro: alert & orientedx3, cranial nerves grossly intact. moves all 4 extremities w/o difficulty. Affect pleasant    ASSESSMENT & PLAN:   1) Chronic systolic HF: Nonischemic cardiomyopathy with EF 15% (03/2012).  Unfortunately [name] continues to struggle with end-stage heart failure and now is followed with Hospice. He has NYHA IV symptoms. Volume status appears at baseline for patient. Continue torsemide 40 qam, 20 qpm. He has weekly blood work with Hospice. - Continue milrinone 0.375 mcg through hickman cath, Hospice to clean around hickman site today d/t dried blood. - He continues to have low K+ will increase KCL to 60 meq BID. - Remain on coreg 3.125 mg BID and Spiro 12.5 mg daily (for severe hypokalemia) - No ACEI with CKD - Reinforced the need and importance of daily weights, a low sodium diet, and fluid restriction (less than 2 L a day). Instructed to call the HF clinic  if weight increases more than 3 lbs overnight or 5 lbs in a week.  2) Atrial fibrillation/flutter: Paroxysmal.  He is in NSR today but has frequent bothersome tachypalpitations.  Increasing amiodarone did not help.   - Can decrease amiodarone to 200 mg bid (may be affecting appetite). - Increase apixaban to 5 mg bid (only taking qd).  - Check TSH/LFTs with next lab draw by hospice.  3) CKD:  - Cr stable. Continue to follow weekly BMETs.   [name] B,NP-C 9:48 AM  Patient seen with NP, agree with the above note.  End-stage NICM.  He looks  near-euvolemic with current torsemide dosing, will continue.  Continue home milrinone.  Follow creatinine closely.  Decrease amiodarone back to 200 mg bid.   Marca Ancona 10/09/2012

## 2012-10-17 ENCOUNTER — Encounter (HOSPITAL_COMMUNITY): Payer: PRIVATE HEALTH INSURANCE

## 2012-10-22 ENCOUNTER — Telehealth (HOSPITAL_COMMUNITY): Payer: Self-pay | Admitting: Cardiology

## 2012-10-22 NOTE — Telephone Encounter (Signed)
Pt is requesting to restart hospice and [facility] services- they will come pick up O2 if services are not restarted Pt will return from [facility] on 10/23

## 2012-10-25 ENCOUNTER — Telehealth (HOSPITAL_COMMUNITY): Payer: Self-pay | Admitting: Anesthesiology

## 2012-10-25 ENCOUNTER — Ambulatory Visit (HOSPITAL_COMMUNITY)
Admission: RE | Admit: 2012-10-25 | Discharge: 2012-10-25 | Disposition: A | Discharge status: Home / Self Care | Source: Ambulatory Visit | Attending: Internal Medicine | Admitting: Internal Medicine

## 2012-10-25 ENCOUNTER — Encounter: Payer: Self-pay | Admitting: Internal Medicine

## 2012-10-25 VITALS — BP 104/78 | HR 105 | Wt 240.0 lb

## 2012-10-25 DIAGNOSIS — I129 Hypertensive chronic kidney disease with stage 1 through stage 4 chronic kidney disease, or unspecified chronic kidney disease: Secondary | ICD-10-CM | POA: Insufficient documentation

## 2012-10-25 DIAGNOSIS — Z79899 Other long term (current) drug therapy: Secondary | ICD-10-CM | POA: Diagnosis not present

## 2012-10-25 DIAGNOSIS — Z95810 Presence of automatic (implantable) cardiac defibrillator: Secondary | ICD-10-CM | POA: Diagnosis not present

## 2012-10-25 DIAGNOSIS — R0609 Other forms of dyspnea: Secondary | ICD-10-CM

## 2012-10-25 DIAGNOSIS — N184 Chronic kidney disease, stage 4 (severe): Secondary | ICD-10-CM | POA: Insufficient documentation

## 2012-10-25 DIAGNOSIS — N189 Chronic kidney disease, unspecified: Secondary | ICD-10-CM

## 2012-10-25 DIAGNOSIS — I4892 Unspecified atrial flutter: Secondary | ICD-10-CM | POA: Diagnosis not present

## 2012-10-25 DIAGNOSIS — D869 Sarcoidosis, unspecified: Secondary | ICD-10-CM

## 2012-10-25 DIAGNOSIS — I4891 Unspecified atrial fibrillation: Secondary | ICD-10-CM

## 2012-10-25 DIAGNOSIS — I509 Heart failure, unspecified: Secondary | ICD-10-CM | POA: Diagnosis not present

## 2012-10-25 DIAGNOSIS — I2510 Atherosclerotic heart disease of native coronary artery without angina pectoris: Secondary | ICD-10-CM | POA: Insufficient documentation

## 2012-10-25 DIAGNOSIS — I428 Other cardiomyopathies: Secondary | ICD-10-CM | POA: Insufficient documentation

## 2012-10-25 DIAGNOSIS — I5022 Chronic systolic (congestive) heart failure: Secondary | ICD-10-CM | POA: Diagnosis present

## 2012-10-25 DIAGNOSIS — K219 Gastro-esophageal reflux disease without esophagitis: Secondary | ICD-10-CM | POA: Insufficient documentation

## 2012-10-25 DIAGNOSIS — I4901 Ventricular fibrillation: Secondary | ICD-10-CM

## 2012-10-25 DIAGNOSIS — E7800 Pure hypercholesterolemia, unspecified: Secondary | ICD-10-CM | POA: Diagnosis not present

## 2012-10-25 DIAGNOSIS — M109 Gout, unspecified: Secondary | ICD-10-CM | POA: Diagnosis not present

## 2012-10-25 DIAGNOSIS — R0989 Other specified symptoms and signs involving the circulatory and respiratory systems: Secondary | ICD-10-CM

## 2012-10-25 LAB — BASIC METABOLIC PANEL
BUN: 68 mg/dL — ABNORMAL HIGH (ref 6–23)
CO2: 26 mEq/L (ref 19–32)
Calcium: 10.3 mg/dL (ref 8.4–10.5)
Chloride: 95 mEq/L — ABNORMAL LOW (ref 96–112)
Creatinine, Ser: 3.36 mg/dL — ABNORMAL HIGH (ref 0.50–1.35)
Glucose, Bld: 126 mg/dL — ABNORMAL HIGH (ref 70–99)

## 2012-10-25 NOTE — Telephone Encounter (Signed)
[name], NP discussed w/pt at [facility] today, he is agreeable for hospice, referral called into them

## 2012-10-25 NOTE — Patient Instructions (Signed)
Have fun this weekend at [facility].  Take an extra 20 mg torsmide whenever your weight on home scale is greater than or equal to 236 lbs. Whenever you take an extra torsmide take an extra 20 mew KCL.  Will follow up with [facility].  Follow up in 3 weeks.  Do the following things EVERYDAY: 1) Weigh yourself in the morning before breakfast. Write it down and keep it in a log. 2) Take your medicines as prescribed 3) Eat low salt foods-Limit salt (sodium) to 2000 mg per day.  4) Stay as active as you can everyday 5) Limit all fluids for the day to less than 2 liters 6)

## 2012-10-25 NOTE — Telephone Encounter (Signed)
K+ low today 2.8, he has not taken am dose of KCL. He struggles with hypokalemia will have him take extra 40 meq KCL today (total 160 meq). Followed by [facility] with weekly labs. Patient aware.

## 2012-10-25 NOTE — Progress Notes (Signed)
Patient ID: [name], male   DOB: 1950-12-20, 62 y.o.   MRN: [id number]   Weight Range    219-221 pounds  Baseline proBNP    511 on 11/13/11   HPI: [name] is a 62 y.o. gentlemen with severe HF due to NICM (EF 20-25%) with multiple hospital admissions for HF exacerbations. He also has history of ventricular tachycardia s/p St Jude ICD by [name], CKD: stage IV, baseline Cr ~2, and paroxysmal atrial arrhythmias on amiodarone as well as sarcoidosis and hypertension. Cath in 2008 by [name] showed no CAD.Seen at [facility] and not felt to be a transplant candidate due to renal failure and lack of family support.  Milrinone initiated in January 2014 for low output. 03/01/12 milrinone increased 0.340mc/kg/min.   RHC 01/26/12  RA = 14  RV = 51/16/15  PA = 57/34 (43)  PCW = 26  Fick cardiac output/index = 4.2/1.8  Thermo CO/CI = 3.9/1.7  PVR = 4.0 Woods  O2 sat = 97%  PA sat = 57%, 58%  Ao Pressure (non-invasive): 116/80 (93)  SVR = 1504   ECHO 03/22/12 EF 15%   04/26/12 S/P Successful DC-CV for atrial fibrillation. Remains on amiodarone.   07/08/12 Creatinine 3.15 Potassium 3.3 07/15/12 Creatinine 3.21  Potassium 3.6 07/22/12 Creatinine 2.6 Potassium 3.5  08/12/12 Creatinine not correct 1.10, Potassium 3.1?  08/15/12 Creatinine 4.24 Potassium 3.3 09/19/12 Creatinine 3.98, Potassium 3.1, BUN 78 10/07/12 Creatinine 2.62, Potassium 3.4 10/18/12 Creatinine 3.3, K+3.1  Follow up: Last visit decreased amiodarone to 200 mg BID and increased KCL to 60 meq BID. [place] and unfortunately has A/C HF and was admitted to the hospital. He was given IV lasix, stabilized and discharged home on previous home medications. Feeling ok. Getting ready for [employer]. Feels fatigued. Denies SOB, orthopnea, or CP. + DOE with minimal exertion. Weight at home 235 lbs.    He is not on an ace inhibitor due to elevated creatinine. Not on nitrates due to Viagra use.   ROS: All  systems negative except as listed in HPI, PMH and Problem List.  Past Medical History  Diagnosis Date  . CHF (congestive heart failure)   . Sarcoidosis   . Cardiomyopathy, dilated, nonischemic     non ischemic by cath  . Acute on chronic systolic heart failure   . Automatic implantable cardiac defibrillator in situ   . Atrial fibrillation   . NSVT (nonsustained ventricular tachycardia)   . GERD (gastroesophageal reflux disease)   . Hypercholesteremia   . Myocardial infarction   . Shortness of breath   . Chronic kidney disease (CKD), stage III (moderate)   . Pacemaker   . Anginal pain   . Gout   . Hypertension     [name]  . Coronary artery disease    Current Outpatient Prescriptions  Medication Sig Dispense Refill  . allopurinol (ZYLOPRIM) 100 MG tablet Take 1 tablet (100 mg total) by mouth daily.  30 tablet  6  . amiodarone (PACERONE) 200 MG tablet Take 1 tablet (200 mg total) by mouth 2 (two) times daily.  60 tablet  6  . apixaban (ELIQUIS) 5 MG TABS tablet Take 1 tablet (5 mg total) by mouth 2 (two) times daily.  60 tablet  4  . carvedilol (COREG) 3.125 MG tablet Take 3.125 mg by mouth 2 (two) times daily with a meal.      . diphenhydrAMINE (BENADRYL) 25 MG tablet Take 25 mg by mouth  every 6 (six) hours as needed for itching.      . esomeprazole (NEXIUM) 40 MG capsule Take 40 mg by mouth daily as needed (for acid reflux).       . fluticasone (FLONASE) 50 MCG/ACT nasal spray Place 2 sprays into the nose daily.      Marland Kitchen HYDROcodone-acetaminophen (NORCO/VICODIN) 5-325 MG per tablet Take 1-2 tablets by mouth every 6 (six) hours as needed.  45 tablet  0  . hydrOXYzine (ATARAX/VISTARIL) 10 MG tablet Take 1 tablet (10 mg total) by mouth 3 (three) times daily as needed for itching.  60 tablet  3  . LORazepam (ATIVAN) 0.5 MG tablet Take 0.5 mg by mouth 2 (two) times daily as needed for anxiety.      . metolazone (ZAROXOLYN) 2.5 MG tablet Take 1 tablet (2.5 mg total) by mouth See  admin instructions. Patient takes as directed by MD for fluid retention  10 tablet  3  . milrinone (PRIMACOR) 20 MG/100ML SOLN infusion Inject 37.3125 mcg/min into the vein continuous.  100 mL    . potassium chloride SA (K-DUR,KLOR-CON) 20 MEQ tablet Take 3 tablets (60 mEq total) by mouth 2 (two) times daily.  180 tablet  6  . sildenafil (VIAGRA) 100 MG tablet Take 1 tablet (100 mg total) by mouth daily as needed for erectile dysfunction.  4 tablet  1  . spironolactone (ALDACTONE) 25 MG tablet Take 0.5 tablets (12.5 mg total) by mouth daily.  90 tablet  3  . torsemide (DEMADEX) 20 MG tablet Take 40 mg in AM and 20 mg in PM       No current facility-administered medications for this encounter.   PHYSICAL EXAM: Filed Vitals:   10/25/12 0852  BP: 104/78  Pulse: 105  Weight: 240 lb (108.863 kg)  SpO2: 97%   General: Well appearing. NAD   HEENT: normal  Neck: supple. JVP 7-8. Carotids 2+ bilat; no bruits. No lymphadenopathy or thryomegaly appreciated.  Cor: PMI laterally displaced. regular S1S2 with +S3 and 2/6 HSM apex. Dried blood around hickman site Lungs: clear Abdomen: soft, nontender, obese, + distended, No bruits or masses. Good bowel sounds.  Extremities: no cyanosis, clubbing, rash, no lower extremity edema  Neuro: alert & orientedx3, cranial nerves grossly intact. moves all 4 extremities w/o difficulty. Affect pleasant    ASSESSMENT & PLAN:   1) Chronic systolic HF: Nonischemic cardiomyopathy with EF 15% (03/2012) - [name] just returned from [facility] and during his trip had to be admitted to the hospital for A/C HF. Since he was admitted he was discharged from Hospice. He reports he is not going to be traveling anymore so will send referral again for Hospice. - NYHA III symptoms and volume status appears stable. Patient planning on going to [employer] this weekend and reinforced following low salt diet or to take an extra 20 mg torsemide if weight on home scale > or  equal to 236 lbs. Told him to take an extra KCL as well whenever he has to take an extra torsemide. Will continue daily torsmide 40/20. - Continue milrinone 0.375 mcg through hickman cath - Remain on coreg 3.125 mg BID and Spiro 12.5 mg daily (for severe hypokalemia) - No ACEI with CKD - Reinforced the need and importance of daily weights, a low sodium diet, and fluid restriction (less than 2 L a day). Instructed to call the HF clinic if weight increases more than 3 lbs overnight or 5 lbs in a week.  2)  Atrial fibrillation/flutter: Paroxysmal He is in NSR today but has frequent bothersome tachypalpitations.  Increasing amiodarone did not help.   - Continue amiodarone to 200 mg bid and apixaban 5 mg BID.  3) CKD, stage IV - Continue to follow will get BMET today.   F/U 3 weeks  [name] B NP-C 12:05 PM

## 2012-10-31 ENCOUNTER — Encounter (HOSPITAL_COMMUNITY): Payer: PRIVATE HEALTH INSURANCE

## 2012-11-06 ENCOUNTER — Encounter (HOSPITAL_COMMUNITY): Payer: Self-pay | Admitting: *Deleted

## 2012-11-06 ENCOUNTER — Telehealth (HOSPITAL_COMMUNITY): Payer: Self-pay | Admitting: *Deleted

## 2012-11-06 NOTE — Telephone Encounter (Signed)
Received call from [facility], RN with Hospice, she states pt has been in a-fib since last week, he increased his Amio on Sat but it has not helped, HR now is 124 and it has been 120s since the weekend, per Dr Gala Romney sch for DCCV.  Procedure scheduled for Fri 11/7 at 11am pt aware and instructions reviewed with him

## 2012-11-07 ENCOUNTER — Other Ambulatory Visit (HOSPITAL_COMMUNITY): Payer: Self-pay | Admitting: Adult Health

## 2012-11-07 DIAGNOSIS — I4891 Unspecified atrial fibrillation: Secondary | ICD-10-CM

## 2012-11-08 ENCOUNTER — Ambulatory Visit (HOSPITAL_COMMUNITY): Admitting: Certified Registered"

## 2012-11-08 ENCOUNTER — Encounter (HOSPITAL_COMMUNITY): Admitting: Certified Registered"

## 2012-11-08 ENCOUNTER — Encounter (HOSPITAL_COMMUNITY)
Admission: RE | Disposition: A | Discharge status: Home / Self Care | Payer: Medicare Other | Source: Ambulatory Visit | Attending: Internal Medicine

## 2012-11-08 ENCOUNTER — Encounter (HOSPITAL_COMMUNITY): Payer: Self-pay | Admitting: Gastroenterology

## 2012-11-08 ENCOUNTER — Ambulatory Visit (HOSPITAL_COMMUNITY)
Admission: RE | Admit: 2012-11-08 | Discharge: 2012-11-08 | Disposition: A | Discharge status: Home / Self Care | Source: Ambulatory Visit | Attending: Internal Medicine | Admitting: Internal Medicine

## 2012-11-08 DIAGNOSIS — E876 Hypokalemia: Secondary | ICD-10-CM | POA: Insufficient documentation

## 2012-11-08 DIAGNOSIS — Z95810 Presence of automatic (implantable) cardiac defibrillator: Secondary | ICD-10-CM | POA: Diagnosis not present

## 2012-11-08 DIAGNOSIS — N184 Chronic kidney disease, stage 4 (severe): Secondary | ICD-10-CM | POA: Diagnosis not present

## 2012-11-08 DIAGNOSIS — Z79899 Other long term (current) drug therapy: Secondary | ICD-10-CM | POA: Diagnosis not present

## 2012-11-08 DIAGNOSIS — I509 Heart failure, unspecified: Secondary | ICD-10-CM | POA: Insufficient documentation

## 2012-11-08 DIAGNOSIS — K219 Gastro-esophageal reflux disease without esophagitis: Secondary | ICD-10-CM | POA: Insufficient documentation

## 2012-11-08 DIAGNOSIS — D869 Sarcoidosis, unspecified: Secondary | ICD-10-CM | POA: Insufficient documentation

## 2012-11-08 DIAGNOSIS — I2510 Atherosclerotic heart disease of native coronary artery without angina pectoris: Secondary | ICD-10-CM | POA: Insufficient documentation

## 2012-11-08 DIAGNOSIS — Z7901 Long term (current) use of anticoagulants: Secondary | ICD-10-CM | POA: Insufficient documentation

## 2012-11-08 DIAGNOSIS — I252 Old myocardial infarction: Secondary | ICD-10-CM | POA: Insufficient documentation

## 2012-11-08 DIAGNOSIS — I5022 Chronic systolic (congestive) heart failure: Secondary | ICD-10-CM | POA: Insufficient documentation

## 2012-11-08 DIAGNOSIS — I428 Other cardiomyopathies: Secondary | ICD-10-CM | POA: Insufficient documentation

## 2012-11-08 DIAGNOSIS — I4891 Unspecified atrial fibrillation: Secondary | ICD-10-CM | POA: Insufficient documentation

## 2012-11-08 DIAGNOSIS — I129 Hypertensive chronic kidney disease with stage 1 through stage 4 chronic kidney disease, or unspecified chronic kidney disease: Secondary | ICD-10-CM | POA: Insufficient documentation

## 2012-11-08 HISTORY — PX: CARDIOVERSION: SHX1299

## 2012-11-08 LAB — POCT I-STAT 4, (NA,K, GLUC, HGB,HCT)
Glucose, Bld: 107 mg/dL — ABNORMAL HIGH (ref 70–99)
HCT: 36 % — ABNORMAL LOW (ref 39.0–52.0)
Hemoglobin: 12.2 g/dL — ABNORMAL LOW (ref 13.0–17.0)
Potassium: 3.8 mEq/L (ref 3.5–5.1)
Sodium: 142 mEq/L (ref 135–145)

## 2012-11-08 SURGERY — CARDIOVERSION
Anesthesia: General

## 2012-11-08 MED ORDER — HEPARIN SOD (PORK) LOCK FLUSH 100 UNIT/ML IV SOLN
250.0000 [IU] | Freq: Every day | INTRAVENOUS | Status: DC
  Filled 2012-11-08: qty 3

## 2012-11-08 MED ORDER — HEPARIN SOD (PORK) LOCK FLUSH 100 UNIT/ML IV SOLN
250.0000 [IU] | INTRAVENOUS | Status: DC | PRN
  Administered 2012-11-08: 250 [IU]
  Filled 2012-11-08: qty 3

## 2012-11-08 MED ORDER — SODIUM CHLORIDE 0.9 % IV SOLN
INTRAVENOUS | Status: DC
  Administered 2012-11-08: 10:00:00 via INTRAVENOUS
  Administered 2012-11-08: 500 mL via INTRAVENOUS

## 2012-11-08 MED ORDER — PROPOFOL 10 MG/ML IV BOLUS
INTRAVENOUS | Status: DC | PRN
  Administered 2012-11-08: 60 mg via INTRAVENOUS

## 2012-11-08 NOTE — Anesthesia Preprocedure Evaluation (Signed)
Anesthesia Evaluation    Airway Mallampati: II TM Distance: >3 FB Neck ROM: Full    Dental  (+) Poor Dentition   Pulmonary shortness of breath,          Cardiovascular hypertension, Pt. on medications + Past MI and +CHF + dysrhythmias     Neuro/Psych Anxiety    GI/Hepatic GERD-  Medicated and Controlled,  Endo/Other    Renal/GU Renal InsufficiencyRenal disease     Musculoskeletal   Abdominal   Peds  Hematology   Anesthesia Other Findings   Reproductive/Obstetrics                           Anesthesia Physical Anesthesia Plan  ASA: III  Anesthesia Plan: General   Post-op Pain Management:    Induction: Intravenous  Airway Management Planned: Mask  Additional Equipment:   Intra-op Plan:   Post-operative Plan:   Informed Consent: I have reviewed the patients History and Physical, chart, labs and discussed the procedure including the risks, benefits and alternatives for the proposed anesthesia with the patient or authorized representative who has indicated his/her understanding and acceptance.   Dental advisory given  Plan Discussed with: CRNA and Surgeon  Anesthesia Plan Comments:         Anesthesia Quick Evaluation

## 2012-11-08 NOTE — Anesthesia Postprocedure Evaluation (Signed)
  Anesthesia Post-op Note  Patient: [name]  Procedure(s) Performed: Procedure(s): CARDIOVERSION (N/A)  Patient Location: Endoscopy Unit  Anesthesia Type:MAC  Level of Consciousness: awake and patient cooperative  Airway and Oxygen Therapy: Patient Spontanous Breathing and Patient connected to nasal cannula oxygen  Post-op Pain: none  Post-op Assessment: Post-op Vital signs reviewed, Patient's Cardiovascular Status Stable, Respiratory Function Stable, Patent Airway, No signs of Nausea or vomiting, Pain level controlled, No headache and No residual numbness  Post-op Vital Signs: Reviewed and stable  Complications: No apparent anesthesia complications

## 2012-11-08 NOTE — Transfer of Care (Signed)
Immediate Anesthesia Transfer of Care Note  Patient: [name]  Procedure(s) Performed: Procedure(s): CARDIOVERSION (N/A)  Patient Location: Endoscopy Unit  Anesthesia Type:MAC  Level of Consciousness: awake, oriented and patient cooperative  Airway & Oxygen Therapy: Patient Spontanous Breathing and Patient connected to nasal cannula oxygen  Post-op Assessment: Report given to PACU RN, Post -op Vital signs reviewed and stable and Patient moving all extremities X 4  Post vital signs: Reviewed and stable  Complications: No apparent anesthesia complications

## 2012-11-08 NOTE — Preoperative (Signed)
Beta Blockers   Reason not to administer Beta Blockers:Not Applicable 

## 2012-11-08 NOTE — Interval H&P Note (Signed)
History and Physical Interval Note:  11/08/2012 9:33 AM  [name]  has presented today for surgery, with the diagnosis of A FIB   The various methods of treatment have been discussed with the patient and family. After consideration of risks, benefits and other options for treatment, the patient has consented to  Procedure(s): CARDIOVERSION (N/A) as a surgical intervention .  The patient's history has been reviewed, patient examined, no change in status, stable for surgery.  I have reviewed the patient's chart and labs.  Questions were answered to the patient's satisfaction.     Fabiano Ginley

## 2012-11-08 NOTE — H&P (View-Only) (Signed)
Patient ID: [name], male   DOB: 02/03/1950, 62 y.o.   MRN: [id number]   Weight Range    219-221 pounds  Baseline proBNP    511 on 11/13/11   HPI: Mr. Date is a 62 y.o. gentlemen with severe HF due to NICM (EF 20-25%) with multiple hospital admissions for HF exacerbations. He also has history of ventricular tachycardia s/p St Jude ICD by Dr. Taylor, CKD: stage IV, baseline Cr ~2, and paroxysmal atrial arrhythmias on amiodarone as well as sarcoidosis and hypertension. Cath in 2008 by Dr. Harwani showed no CAD.Seen at [facility] and not felt to be a transplant candidate due to renal failure and lack of family support.  Milrinone initiated in January 2014 for low output. 03/01/12 milrinone increased 0.375mc/kg/min.   RHC 01/26/12  RA = 14  RV = 51/16/15  PA = 57/34 (43)  PCW = 26  Fick cardiac output/index = 4.2/1.8  Thermo CO/CI = 3.9/1.7  PVR = 4.0 Woods  O2 sat = 97%  PA sat = 57%, 58%  Ao Pressure (non-invasive): 116/80 (93)  SVR = 1504   ECHO 03/22/12 EF 15%   04/26/12 S/P Successful DC-CV for atrial fibrillation. Remains on amiodarone.   07/08/12 Creatinine 3.15 Potassium 3.3 07/15/12 Creatinine 3.21  Potassium 3.6 07/22/12 Creatinine 2.6 Potassium 3.5  08/12/12 Creatinine not correct 1.10, Potassium 3.1?  08/15/12 Creatinine 4.24 Potassium 3.3 09/19/12 Creatinine 3.98, Potassium 3.1, BUN 78 10/07/12 Creatinine 2.62, Potassium 3.4 10/18/12 Creatinine 3.3, K+3.1  Follow up: Last visit decreased amiodarone to 200 mg BID and increased KCL to 60 meq BID. Just returned from [place] and unfortunately has A/C HF and was admitted to the hospital. He was given IV lasix, stabilized and discharged home on previous home medications. Feeling ok. Getting ready for [employer]. Feels fatigued. Denies SOB, orthopnea, or CP. + DOE with minimal exertion. Weight at home 235 lbs.    He is not on an ace inhibitor due to elevated creatinine. Not on nitrates due to Viagra use.   ROS: All  systems negative except as listed in HPI, PMH and Problem List.  Past Medical History  Diagnosis Date  . CHF (congestive heart failure)   . Sarcoidosis   . Cardiomyopathy, dilated, nonischemic     non ischemic by cath  . Acute on chronic systolic heart failure   . Automatic implantable cardiac defibrillator in situ   . Atrial fibrillation   . NSVT (nonsustained ventricular tachycardia)   . GERD (gastroesophageal reflux disease)   . Hypercholesteremia   . Myocardial infarction   . Shortness of breath   . Chronic kidney disease (CKD), stage III (moderate)   . Pacemaker   . Anginal pain   . Gout   . Hypertension     dr harwarnia  . Coronary artery disease    Current Outpatient Prescriptions  Medication Sig Dispense Refill  . allopurinol (ZYLOPRIM) 100 MG tablet Take 1 tablet (100 mg total) by mouth daily.  30 tablet  6  . amiodarone (PACERONE) 200 MG tablet Take 1 tablet (200 mg total) by mouth 2 (two) times daily.  60 tablet  6  . apixaban (ELIQUIS) 5 MG TABS tablet Take 1 tablet (5 mg total) by mouth 2 (two) times daily.  60 tablet  4  . carvedilol (COREG) 3.125 MG tablet Take 3.125 mg by mouth 2 (two) times daily with a meal.      . diphenhydrAMINE (BENADRYL) 25 MG tablet Take 25 mg by mouth   every 6 (six) hours as needed for itching.      . esomeprazole (NEXIUM) 40 MG capsule Take 40 mg by mouth daily as needed (for acid reflux).       . fluticasone (FLONASE) 50 MCG/ACT nasal spray Place 2 sprays into the nose daily.      . HYDROcodone-acetaminophen (NORCO/VICODIN) 5-325 MG per tablet Take 1-2 tablets by mouth every 6 (six) hours as needed.  45 tablet  0  . hydrOXYzine (ATARAX/VISTARIL) 10 MG tablet Take 1 tablet (10 mg total) by mouth 3 (three) times daily as needed for itching.  60 tablet  3  . LORazepam (ATIVAN) 0.5 MG tablet Take 0.5 mg by mouth 2 (two) times daily as needed for anxiety.      . metolazone (ZAROXOLYN) 2.5 MG tablet Take 1 tablet (2.5 mg total) by mouth See  admin instructions. Patient takes as directed by MD for fluid retention  10 tablet  3  . milrinone (PRIMACOR) 20 MG/100ML SOLN infusion Inject 37.3125 mcg/min into the vein continuous.  100 mL    . potassium chloride SA (K-DUR,KLOR-CON) 20 MEQ tablet Take 3 tablets (60 mEq total) by mouth 2 (two) times daily.  180 tablet  6  . sildenafil (VIAGRA) 100 MG tablet Take 1 tablet (100 mg total) by mouth daily as needed for erectile dysfunction.  4 tablet  1  . spironolactone (ALDACTONE) 25 MG tablet Take 0.5 tablets (12.5 mg total) by mouth daily.  90 tablet  3  . torsemide (DEMADEX) 20 MG tablet Take 40 mg in AM and 20 mg in PM       No current facility-administered medications for this encounter.   PHYSICAL EXAM: Filed Vitals:   10/25/12 0852  BP: 104/78  Pulse: 105  Weight: 240 lb (108.863 kg)  SpO2: 97%   General: Well appearing. NAD   HEENT: normal  Neck: supple. JVP 7-8. Carotids 2+ bilat; no bruits. No lymphadenopathy or thryomegaly appreciated.  Cor: PMI laterally displaced. regular S1S2 with +S3 and 2/6 HSM apex. Dried blood around hickman site Lungs: clear Abdomen: soft, nontender, obese, + distended, No bruits or masses. Good bowel sounds.  Extremities: no cyanosis, clubbing, rash, no lower extremity edema  Neuro: alert & orientedx3, cranial nerves grossly intact. moves all 4 extremities w/o difficulty. Affect pleasant    ASSESSMENT & PLAN:   1) Chronic systolic HF: Nonischemic cardiomyopathy with EF 15% (03/2012) - Mr. Ridolfi just returned from [place] and during his trip had to be admitted to the hospital for A/C HF. Since he was admitted he was discharged from Hospice. He reports he is not going to be traveling anymore so will send referral again for Hospice. - NYHA III symptoms and volume status appears stable. Patient planning on going to [employer] this weekend and reinforced following low salt diet or to take an extra 20 mg torsemide if weight on home scale > or  equal to 236 lbs. Told him to take an extra KCL as well whenever he has to take an extra torsemide. Will continue daily torsmide 40/20. - Continue milrinone 0.375 mcg through hickman cath - Remain on coreg 3.125 mg BID and Spiro 12.5 mg daily (for severe hypokalemia) - No ACEI with CKD - Reinforced the need and importance of daily weights, a low sodium diet, and fluid restriction (less than 2 L a day). Instructed to call the HF clinic if weight increases more than 3 lbs overnight or 5 lbs in a week.  2)   Atrial fibrillation/flutter: Paroxysmal He is in NSR today but has frequent bothersome tachypalpitations.  Increasing amiodarone did not help.   - Continue amiodarone to 200 mg bid and apixaban 5 mg BID.  3) CKD, stage IV - Continue to follow will get BMET today.   F/U 3 weeks  Abdou Stocks B NP-C 12:05 PM               

## 2012-11-11 ENCOUNTER — Encounter (HOSPITAL_COMMUNITY): Payer: Self-pay | Admitting: Internal Medicine

## 2012-11-14 ENCOUNTER — Ambulatory Visit (HOSPITAL_COMMUNITY)
Admission: RE | Admit: 2012-11-14 | Discharge: 2012-11-14 | Disposition: A | Discharge status: Home / Self Care | Source: Ambulatory Visit | Attending: Internal Medicine | Admitting: Internal Medicine

## 2012-11-14 ENCOUNTER — Encounter (HOSPITAL_COMMUNITY): Payer: Self-pay

## 2012-11-14 VITALS — BP 102/60 | HR 90 | Wt 239.0 lb

## 2012-11-14 DIAGNOSIS — Z7901 Long term (current) use of anticoagulants: Secondary | ICD-10-CM | POA: Insufficient documentation

## 2012-11-14 DIAGNOSIS — R9431 Abnormal electrocardiogram [ECG] [EKG]: Secondary | ICD-10-CM | POA: Insufficient documentation

## 2012-11-14 DIAGNOSIS — I4891 Unspecified atrial fibrillation: Secondary | ICD-10-CM | POA: Insufficient documentation

## 2012-11-14 DIAGNOSIS — Z950 Presence of cardiac pacemaker: Secondary | ICD-10-CM | POA: Insufficient documentation

## 2012-11-14 DIAGNOSIS — I499 Cardiac arrhythmia, unspecified: Secondary | ICD-10-CM | POA: Insufficient documentation

## 2012-11-14 DIAGNOSIS — I4892 Unspecified atrial flutter: Secondary | ICD-10-CM

## 2012-11-14 DIAGNOSIS — I509 Heart failure, unspecified: Secondary | ICD-10-CM

## 2012-11-14 DIAGNOSIS — N19 Unspecified kidney failure: Secondary | ICD-10-CM | POA: Insufficient documentation

## 2012-11-14 DIAGNOSIS — N182 Chronic kidney disease, stage 2 (mild): Secondary | ICD-10-CM

## 2012-11-14 DIAGNOSIS — I5022 Chronic systolic (congestive) heart failure: Secondary | ICD-10-CM

## 2012-11-14 NOTE — Patient Instructions (Addendum)
Follow up next week EKG  Follow up in 2 months   Do the following things EVERYDAY: 1) Weigh yourself in the morning before breakfast. Write it down and keep it in a log. 2) Take your medicines as prescribed 3) Eat low salt foods-Limit salt (sodium) to 2000 mg per day.  4) Stay as active as you can everyday 5) Limit all fluids for the day to less than 2 liters

## 2012-11-14 NOTE — Progress Notes (Addendum)
Patient ID: [name], male   DOB: 11/30/1950, 62 y.o.   MRN: [id number]   Weight Range    219-221 pounds  Baseline proBNP    511 on 11/13/11   HPI: [name] is a 62 y.o. gentlemen with severe HF due to NICM (EF 20-25%) with multiple hospital admissions for HF exacerbations. He also has history of ventricular tachycardia s/p St Jude ICD by [name], CKD: stage IV, baseline Cr ~2, and paroxysmal atrial arrhythmias on amiodarone as well as sarcoidosis and hypertension. Cath in 2008 by Dr. [name] showed no CAD.Seen at [facility] and not felt to be a transplant candidate due to renal failure and lack of family support.  Milrinone initiated in January 2014 for low output. 03/01/12 milrinone increased 0.332mc/kg/min.   RHC 01/26/12  RA = 14  RV = 51/16/15  PA = 57/34 (43)  PCW = 26  Fick cardiac output/index = 4.2/1.8  Thermo CO/CI = 3.9/1.7  PVR = 4.0 Woods  O2 sat = 97%  PA sat = 57%, 58%  Ao Pressure (non-invasive): 116/80 (93)  SVR = 1504   ECHO 03/22/12 EF 15%   04/26/12 S/P Successful DC-CV for atrial fibrillation. Remains on amiodarone.   07/08/12 Creatinine 3.15 Potassium 3.3 07/15/12 Creatinine 3.21  Potassium 3.6 07/22/12 Creatinine 2.6 Potassium 3.5  08/12/12 Creatinine not correct 1.10, Potassium 3.1?  08/15/12 Creatinine 4.24 Potassium 3.3 09/19/12 Creatinine 3.98, Potassium 3.1, BUN 78 10/07/12 Creatinine 2.62, Potassium 3.4 10/18/12 Creatinine 3.3, K+3.1 11/08/12 Creatinine 2.15 K 4.0  He returns for follow up. Remains SOB with exertion. + Orthopnea + 2-3 pillows.  Says he has limited activity at home. Weight at home 234 pounds. He continues on 0.375 mcg Milrinone. Followed by [facility]. Taking all meds.      He is not on an ace inhibitor due to elevated creatinine. Not on nitrates due to Viagra use.   ROS: All systems negative except as listed in HPI, PMH and Problem List.  Past Medical History  Diagnosis Date  . CHF (congestive heart failure)   .  Sarcoidosis   . Cardiomyopathy, dilated, nonischemic     non ischemic by cath  . Acute on chronic systolic heart failure   . Automatic implantable cardiac defibrillator in situ   . Atrial fibrillation   . NSVT (nonsustained ventricular tachycardia)   . GERD (gastroesophageal reflux disease)   . Hypercholesteremia   . Myocardial infarction   . Shortness of breath   . Chronic kidney disease (CKD), stage III (moderate)   . Pacemaker   . Anginal pain   . Gout   . Hypertension     [name]  . Coronary artery disease    Current Outpatient Prescriptions  Medication Sig Dispense Refill  . allopurinol (ZYLOPRIM) 100 MG tablet Take 1 tablet (100 mg total) by mouth daily.  30 tablet  6  . amiodarone (PACERONE) 200 MG tablet Take 1 tablet (200 mg total) by mouth 2 (two) times daily.  60 tablet  6  . apixaban (ELIQUIS) 5 MG TABS tablet Take 1 tablet (5 mg total) by mouth 2 (two) times daily.  60 tablet  4  . carvedilol (COREG) 3.125 MG tablet Take 3.125 mg by mouth 2 (two) times daily with a meal.      . diphenhydrAMINE (BENADRYL) 25 MG tablet Take 25 mg by mouth every 6 (six) hours as needed for itching.      . esomeprazole (NEXIUM) 40 MG capsule Take 40 mg  by mouth daily as needed (for acid reflux).       . fluticasone (FLONASE) 50 MCG/ACT nasal spray Place 2 sprays into the nose daily.      Marland Kitchen HYDROcodone-acetaminophen (NORCO/VICODIN) 5-325 MG per tablet Take 1-2 tablets by mouth every 6 (six) hours as needed.  45 tablet  0  . hydrOXYzine (ATARAX/VISTARIL) 10 MG tablet Take 1 tablet (10 mg total) by mouth 3 (three) times daily as needed for itching.  60 tablet  3  . LORazepam (ATIVAN) 0.5 MG tablet Take 0.5 mg by mouth 2 (two) times daily as needed for anxiety.      . metolazone (ZAROXOLYN) 2.5 MG tablet Take 1 tablet (2.5 mg total) by mouth See admin instructions. Patient takes as directed by MD for fluid retention  10 tablet  3  . milrinone (PRIMACOR) 20 MG/100ML SOLN infusion Inject  37.3125 mcg/min into the vein continuous.  100 mL    . potassium chloride SA (K-DUR,KLOR-CON) 20 MEQ tablet Take 3 tablets (60 mEq total) by mouth 2 (two) times daily.  180 tablet  6  . sildenafil (VIAGRA) 100 MG tablet Take 1 tablet (100 mg total) by mouth daily as needed for erectile dysfunction.  4 tablet  1  . spironolactone (ALDACTONE) 25 MG tablet Take 0.5 tablets (12.5 mg total) by mouth daily.  90 tablet  3  . torsemide (DEMADEX) 20 MG tablet Take 40 mg in AM and 20 mg in PM       No current facility-administered medications for this encounter.   PHYSICAL EXAM: Filed Vitals:   11/14/12 0929  BP: 102/60  Pulse: 90  Weight: 239 lb (108.41 kg)  SpO2: 94%   General: Well appearing. NAD   HEENT: normal  Neck: supple. JVP 8-9. Carotids 2+ bilat; no bruits. No lymphadenopathy or thryomegaly appreciated.  Cor: PMI laterally displaced. regular S1S2 with +S3 and 2/6 HSM apex. R up[per chest hickman site Lungs: clear Abdomen: soft, nontender, obese, + distended, No bruits or masses. Good bowel sounds.  Extremities: no cyanosis, clubbing, rash, no lower extremity edema  Neuro: alert & orientedx3, cranial nerves grossly intact. moves all 4 extremities w/o difficulty. Affect pleasant    EKG:Per Dr Gala Romney SR QTc 656  ASSESSMENT & PLAN:   1) Chronic systolic HF: Nonischemic cardiomyopathy with EF 15% (03/2012) - Actively followded by [facility]. - NYHA IIIb.  Volume status stable. Continue torsemide 40 mg in am and 20 mg in pm. Continue to take extra 20 mg torsemide if his weight is 238  Or greater.  [facility] to assist with symptom management for dyspnea and anxiety.   - Continue milrinone 0.375 mcg through hickman cath - Remain on coreg 3.125 mg BID and Spiro 12.5 mg daily (for severe hypokalemia) Reviewed lab work from 11/07/12 .Renal fucntion and potassium ok.  Continue weekly BMET  - No ACEI with CKD - Reinforced the need and importance of daily weights, a low  sodium diet, and fluid restriction (less than 2 L a day). Instructed to call the HF clinic if weight increases more than 3 lbs overnight or 5 lbs in a week.  2) Atrial fibrillation/flutter: Paroxysmal He is in NSR today with long qtc noted on EKG. Stop atarax, benadryl, and amiodarone. Repeat EKG next week.  Continue Apixaban 5 mg BID.  3) CKD, stage IV Continue weekly bmet.   Follow up next week for an EKG. Follow up in 2 months   Darrell Bula NP-C 9:42 AM

## 2012-11-15 NOTE — Addendum Note (Signed)
Encounter addended by: Deitra Mayo, CCT on: 11/15/2012  9:07 AM<BR>     Documentation filed: Charges VN

## 2012-11-16 ENCOUNTER — Emergency Department (HOSPITAL_COMMUNITY)

## 2012-11-16 ENCOUNTER — Encounter (HOSPITAL_COMMUNITY): Payer: Self-pay | Admitting: Emergency Medicine

## 2012-11-16 ENCOUNTER — Telehealth: Payer: Self-pay | Admitting: Physician Assistant

## 2012-11-16 ENCOUNTER — Inpatient Hospital Stay (HOSPITAL_COMMUNITY)
Admission: EM | Admit: 2012-11-16 | Discharge: 2012-11-20 | DRG: 309 | Disposition: A | Discharge status: Home Health | Attending: Internal Medicine | Admitting: Internal Medicine

## 2012-11-16 DIAGNOSIS — I4729 Other ventricular tachycardia: Secondary | ICD-10-CM | POA: Diagnosis present

## 2012-11-16 DIAGNOSIS — I2510 Atherosclerotic heart disease of native coronary artery without angina pectoris: Secondary | ICD-10-CM | POA: Diagnosis present

## 2012-11-16 DIAGNOSIS — E871 Hypo-osmolality and hyponatremia: Secondary | ICD-10-CM | POA: Diagnosis not present

## 2012-11-16 DIAGNOSIS — I129 Hypertensive chronic kidney disease with stage 1 through stage 4 chronic kidney disease, or unspecified chronic kidney disease: Secondary | ICD-10-CM | POA: Diagnosis present

## 2012-11-16 DIAGNOSIS — R0789 Other chest pain: Secondary | ICD-10-CM | POA: Diagnosis present

## 2012-11-16 DIAGNOSIS — Z8249 Family history of ischemic heart disease and other diseases of the circulatory system: Secondary | ICD-10-CM

## 2012-11-16 DIAGNOSIS — Z95810 Presence of automatic (implantable) cardiac defibrillator: Secondary | ICD-10-CM | POA: Diagnosis present

## 2012-11-16 DIAGNOSIS — Z515 Encounter for palliative care: Secondary | ICD-10-CM

## 2012-11-16 DIAGNOSIS — I4891 Unspecified atrial fibrillation: Secondary | ICD-10-CM | POA: Diagnosis present

## 2012-11-16 DIAGNOSIS — N182 Chronic kidney disease, stage 2 (mild): Secondary | ICD-10-CM | POA: Diagnosis present

## 2012-11-16 DIAGNOSIS — I5023 Acute on chronic systolic (congestive) heart failure: Secondary | ICD-10-CM

## 2012-11-16 DIAGNOSIS — R079 Chest pain, unspecified: Secondary | ICD-10-CM

## 2012-11-16 DIAGNOSIS — I5022 Chronic systolic (congestive) heart failure: Secondary | ICD-10-CM | POA: Diagnosis present

## 2012-11-16 DIAGNOSIS — I4892 Unspecified atrial flutter: Principal | ICD-10-CM | POA: Diagnosis present

## 2012-11-16 DIAGNOSIS — N184 Chronic kidney disease, stage 4 (severe): Secondary | ICD-10-CM | POA: Diagnosis present

## 2012-11-16 DIAGNOSIS — I491 Atrial premature depolarization: Secondary | ICD-10-CM | POA: Diagnosis present

## 2012-11-16 DIAGNOSIS — E7800 Pure hypercholesterolemia, unspecified: Secondary | ICD-10-CM | POA: Diagnosis present

## 2012-11-16 DIAGNOSIS — I252 Old myocardial infarction: Secondary | ICD-10-CM

## 2012-11-16 DIAGNOSIS — I509 Heart failure, unspecified: Secondary | ICD-10-CM | POA: Diagnosis present

## 2012-11-16 DIAGNOSIS — I959 Hypotension, unspecified: Secondary | ICD-10-CM | POA: Diagnosis present

## 2012-11-16 DIAGNOSIS — I472 Ventricular tachycardia, unspecified: Secondary | ICD-10-CM | POA: Diagnosis present

## 2012-11-16 DIAGNOSIS — I420 Dilated cardiomyopathy: Secondary | ICD-10-CM | POA: Diagnosis present

## 2012-11-16 DIAGNOSIS — N189 Chronic kidney disease, unspecified: Secondary | ICD-10-CM

## 2012-11-16 DIAGNOSIS — M109 Gout, unspecified: Secondary | ICD-10-CM | POA: Diagnosis present

## 2012-11-16 DIAGNOSIS — E876 Hypokalemia: Secondary | ICD-10-CM | POA: Diagnosis present

## 2012-11-16 DIAGNOSIS — I428 Other cardiomyopathies: Secondary | ICD-10-CM | POA: Diagnosis present

## 2012-11-16 DIAGNOSIS — K219 Gastro-esophageal reflux disease without esophagitis: Secondary | ICD-10-CM | POA: Diagnosis present

## 2012-11-16 DIAGNOSIS — Z823 Family history of stroke: Secondary | ICD-10-CM

## 2012-11-16 DIAGNOSIS — T502X5A Adverse effect of carbonic-anhydrase inhibitors, benzothiadiazides and other diuretics, initial encounter: Secondary | ICD-10-CM | POA: Diagnosis not present

## 2012-11-16 DIAGNOSIS — D869 Sarcoidosis, unspecified: Secondary | ICD-10-CM | POA: Diagnosis present

## 2012-11-16 DIAGNOSIS — Z981 Arthrodesis status: Secondary | ICD-10-CM

## 2012-11-16 LAB — BASIC METABOLIC PANEL
BUN: 67 mg/dL — ABNORMAL HIGH (ref 6–23)
CO2: 25 mEq/L (ref 19–32)
Chloride: 97 mEq/L (ref 96–112)
GFR calc Af Amer: 26 mL/min — ABNORMAL LOW (ref >90)
GFR calc non Af Amer: 23 mL/min — ABNORMAL LOW (ref >90)
Glucose, Bld: 112 mg/dL — ABNORMAL HIGH (ref 70–99)
Potassium: 3 mEq/L — ABNORMAL LOW (ref 3.5–5.1)
Sodium: 134 mEq/L — ABNORMAL LOW (ref 135–145)

## 2012-11-16 LAB — CBC
MCHC: 36.5 g/dL — ABNORMAL HIGH (ref 30.0–36.0)
MCV: 87.8 fL (ref 78.0–100.0)
Platelets: 245 10*3/uL (ref 150–400)
RBC: 4.77 MIL/uL (ref 4.22–5.81)
RDW: 12.6 % (ref 11.5–15.5)
WBC: 5.2 10*3/uL (ref 4.0–10.5)

## 2012-11-16 LAB — PRO B NATRIURETIC PEPTIDE: Pro B Natriuretic peptide (BNP): 281.2 pg/mL — ABNORMAL HIGH (ref 0–125)

## 2012-11-16 LAB — POCT I-STAT TROPONIN I: Troponin i, poc: 0.03 ng/mL (ref 0.00–0.08)

## 2012-11-16 MED ORDER — MORPHINE SULFATE 2 MG/ML IJ SOLN
2.0000 mg | Freq: Once | INTRAMUSCULAR | Status: AC
  Administered 2012-11-16: 2 mg via INTRAVENOUS
  Filled 2012-11-16: qty 1

## 2012-11-16 NOTE — ED Notes (Signed)
First contact with pt. Pt resting with pa at bedside. Nothing needed at this time. Report received from [place], V.

## 2012-11-16 NOTE — Telephone Encounter (Signed)
Hospice RN called with labs from yesterday with K+ 2.7 Daily K+ dose is 60 mEq bid.  Baseline creatinine 2.80-3.89.  He is on spironolactone 12.5 QD and Torsemide 40 in AM and 20 in PM. I asked her to give him an extra K+ 60 mEq today. Increase daily K+ to 80 mEq bid. Repeat BMET tomorrow. Repeat BMET again Wednesday of next week as well.  Signed,  Tereso Newcomer, PA-C   11/16/2012 8:43 AM

## 2012-11-16 NOTE — ED Provider Notes (Signed)
CSN: [id number]     Arrival date & time 11/16/12  2147 History   First MD Initiated Contact with Patient 11/16/12 2233     Chief Complaint  Patient presents with  . Chest Pain   (Consider location/radiation/quality/duration/timing/severity/associated sxs/prior Treatment) HPI Comments: The patient is a 62 year old patient currently a [facility] patient with a past medical history of severe HF (EF-15%) with multiple hospital admissions for HF exacerbations, ventricular tachycardia s/p St Jude ICD, chronic kidney disease (baseline Cr approximately 2.8-4), and paroxysmal atrial arrhythmias controlled by amiodarone, sarcoidosis, and hypertension presents today with increasing shortness of breath and atrial flutter. He is currently on 2L of oxygen at home and has been compliant with all of his medical therapy at this time. The patient reports the worsening dyspnea started several days ago.  He reports the atrial flutter increased yesterday and reports he "could not take it anymore".   The patient also reports mild constant chest pain that is central without radiation.  He also reports that he had a recent cardioversion scheduled but after sedation went into NSR.  No nausea, vomiting, fever or chills.  The history is provided by the patient.    Past Medical History  Diagnosis Date  . CHF (congestive heart failure)   . Sarcoidosis   . Cardiomyopathy, dilated, nonischemic     non ischemic by cath  . Acute on chronic systolic heart failure   . Automatic implantable cardiac defibrillator in situ   . Atrial fibrillation   . NSVT (nonsustained ventricular tachycardia)   . GERD (gastroesophageal reflux disease)   . Hypercholesteremia   . Myocardial infarction   . Shortness of breath   . Chronic kidney disease (CKD), stage III (moderate)   . Pacemaker   . Anginal pain   . Gout   . Hypertension     [name]  . Coronary artery disease    Past Surgical History  Procedure Laterality  Date  . Back surgery  1987    Ruptured disk repair  . Pacemaker insertion      with ICD  . Tee without cardioversion  01/17/2011    Procedure: TRANSESOPHAGEAL ECHOCARDIOGRAM (TEE);  Surgeon: Ricki Rodriguez, MD;  Location: [facility] ENDOSCOPY;  Service: Cardiovascular;  Laterality: N/A;  . Cardioversion  01/17/2011    Procedure: CARDIOVERSION;  Surgeon: Ricki Rodriguez, MD;  Location: [facility] ENDOSCOPY;  Service: Cardiovascular;  Laterality: N/A;  . Cardiac catheterization    . Insert / replace / remove pacemaker    . Anterior cervical decomp/discectomy fusion  08/21/2011    Procedure: ANTERIOR CERVICAL DECOMPRESSION/DISCECTOMY FUSION 2 LEVELS;  Surgeon: Eldred Manges, MD;  Location: [facility] OR;  Service: Orthopedics;  Laterality: N/A;  C5-6, C6-7 Anterior Cervical Discectomy and Fusion, allograft, plate  . Tee without cardioversion N/A 02/16/2012    Procedure: TRANSESOPHAGEAL ECHOCARDIOGRAM (TEE);  Surgeon: Dolores Patty, MD;  Location: [facility] ENDOSCOPY;  Service: Cardiovascular;  Laterality: N/A;  original case scheduled under his dad (who is deceased), rescheduled under correct mrn/pt/dob. [place]/dl  . Tee without cardioversion N/A 03/22/2012    Procedure: TRANSESOPHAGEAL ECHOCARDIOGRAM (TEE);  Surgeon: Lewayne Bunting, MD;  Location: [facility] ENDOSCOPY;  Service: Cardiovascular;  Laterality: N/A;  . Cardioversion N/A 03/22/2012    Procedure: CARDIOVERSION;  Surgeon: Lewayne Bunting, MD;  Location: [facility] ENDOSCOPY;  Service: Cardiovascular;  Laterality: N/A;  . Cardioversion N/A 04/26/2012    Procedure: CARDIOVERSION;  Surgeon: Dolores Patty, MD;  Location: [facility] ENDOSCOPY;  Service: Cardiovascular;  Laterality: N/A;  . Central venous catheter tunneled insertion single lumen  09/16/2012    right IJ  . Cardioversion N/A 11/08/2012    Procedure: CARDIOVERSION;  Surgeon: Dolores Patty, MD;  Location: [facility] ENDOSCOPY;  Service: Cardiovascular;  Laterality: N/A;   Family History  Problem Relation Age of Onset  . Heart  disease    . Heart failure    . Stroke    . Anesthesia problems Neg Hx   . Hypotension Neg Hx   . Malignant hyperthermia Neg Hx   . Pseudochol deficiency Neg Hx    History  Substance Use Topics  . Smoking status: Never Smoker   . Smokeless tobacco: Never Used  . Alcohol Use: No    Review of Systems  Allergies  Nitroglycerin  Home Medications   Current Outpatient Rx  Name  Route  Sig  Dispense  Refill  . allopurinol (ZYLOPRIM) 100 MG tablet   Oral   Take 1 tablet (100 mg total) by mouth daily.   30 tablet   6   . amiodarone (PACERONE) 200 MG tablet   Oral   Take 200 mg by mouth 2 (two) times daily.         Marland Kitchen apixaban (ELIQUIS) 5 MG TABS tablet   Oral   Take 1 tablet (5 mg total) by mouth 2 (two) times daily.   60 tablet   4     Please file dose change.   . carvedilol (COREG) 3.125 MG tablet   Oral   Take 3.125 mg by mouth 2 (two) times daily with a meal.         . esomeprazole (NEXIUM) 40 MG capsule   Oral   Take 40 mg by mouth daily as needed (for acid reflux).          . fluticasone (FLONASE) 50 MCG/ACT nasal spray   Each Nare   Place 2 sprays into both nostrils daily as needed for allergies.          Marland Kitchen LORazepam (ATIVAN) 0.5 MG tablet   Oral   Take 0.5 mg by mouth 2 (two) times daily as needed for anxiety.         . metolazone (ZAROXOLYN) 2.5 MG tablet   Oral   Take 1 tablet (2.5 mg total) by mouth See admin instructions. Patient takes as directed by MD for fluid retention   10 tablet   3   . milrinone (PRIMACOR) 20 MG/100ML SOLN infusion   Intravenous   Inject 37.3125 mcg/min into the vein continuous.   100 mL      . potassium chloride SA (K-DUR,KLOR-CON) 20 MEQ tablet   Oral   Take 3 tablets (60 mEq total) by mouth 2 (two) times daily.   180 tablet   6     Please file dose change patient has medication   . sildenafil (VIAGRA) 100 MG tablet   Oral   Take 1 tablet (100 mg total) by mouth daily as needed for erectile  dysfunction.   4 tablet   1   . spironolactone (ALDACTONE) 25 MG tablet   Oral   Take 0.5 tablets (12.5 mg total) by mouth daily.   90 tablet   3   . torsemide (DEMADEX) 20 MG tablet      Take 40 mg in AM and 20 mg in PM          BP 115/91  Pulse 88  Temp(Src) 98.2 F (36.8 C) (Oral)  Resp 25  Ht 6\' 2"  (1.88 m)  Wt 237 lb (107.502 kg)  BMI 30.42 kg/m2  SpO2 99% Physical Exam  Nursing note and vitals reviewed. Constitutional: He appears well-developed and well-nourished.  HENT:  Head: Normocephalic and atraumatic.  Neck: Normal range of motion. Neck supple.  Cardiovascular: Normal rate.   Murmur heard. Pulmonary/Chest: No accessory muscle usage. No respiratory distress. He has no wheezes. He has no rhonchi. He has no rales.  Patient is able to speak in complete sentence.   Abdominal: Soft. Normal appearance. There is tenderness in the epigastric area. There is no rigidity, no rebound and no guarding.    ED Course  Procedures (including critical care time) Labs Review Labs Reviewed  CBC  BASIC METABOLIC PANEL  PRO B NATRIURETIC PEPTIDE   Imaging Review No results found.  EKG Interpretation    Date/Time:    Ventricular Rate:  86 PR Interval:  171 QRS Duration: 113 QT Interval:  495 QTC Calculation: 592 R Axis:   -87 Text Interpretation:  Ectopic atrial rhythm Left anterior fascicular block Abnormal R-wave progression, late transition Nonspecific T abnrm, anterolateral leads Prolonged QT interval ED PHYSICIAN INTERPRETATION AVAILABLE IN [facility]            MDM   1. Systolic CHF, acute on chronic   2. Atrial fibrillation   3. Automatic implantable cardiac defibrillator in situ   4. Chest pain, mid sternal   5. Chronic kidney disease, stage II (mild)   6. Hypokalemia   7. Hypotension   8. Atrial flutter   9. Chronic systolic CHF (congestive heart failure), NYHA class 3   10. Chest pain   11. CKD (chronic kidney disease)    The  patient is currently under hospice care and has an increase in shortness of breath and palpitations.  Labs and imaging ordered.  Discussed patient history and condition with Dr. Dierdre Highman, after his evaluation of the patient he states he will consult Internal medicine for admission.   Clabe Seal, PA-C 11/19/12 0710  Clabe Seal, PA-C 11/23/12 1622

## 2012-11-16 NOTE — ED Notes (Addendum)
Per EMS pt started having CP last night described as sharp and radiating down right arm. EMS reports pt did not call 911 last night because his pain usually subsides but did not this time. Pt reports he feels his heart fluttering- also feels SOB, lightheaded with the CP. VSS. NSR on monitor. Pt has milrinone pump for CHF. EMS gave pt 2mg  morphine IV. Pt refused nitroglycerin SL tablet.

## 2012-11-17 ENCOUNTER — Encounter (HOSPITAL_COMMUNITY): Payer: Self-pay | Admitting: *Deleted

## 2012-11-17 ENCOUNTER — Other Ambulatory Visit: Payer: Self-pay | Admitting: Internal Medicine

## 2012-11-17 DIAGNOSIS — I509 Heart failure, unspecified: Secondary | ICD-10-CM

## 2012-11-17 DIAGNOSIS — E876 Hypokalemia: Secondary | ICD-10-CM | POA: Diagnosis present

## 2012-11-17 DIAGNOSIS — I959 Hypotension, unspecified: Secondary | ICD-10-CM | POA: Diagnosis present

## 2012-11-17 DIAGNOSIS — I5023 Acute on chronic systolic (congestive) heart failure: Secondary | ICD-10-CM

## 2012-11-17 DIAGNOSIS — Z95810 Presence of automatic (implantable) cardiac defibrillator: Secondary | ICD-10-CM

## 2012-11-17 DIAGNOSIS — I4892 Unspecified atrial flutter: Principal | ICD-10-CM

## 2012-11-17 LAB — TROPONIN I
Troponin I: <0.3 ng/mL (ref <0.30)
Troponin I: <0.3 ng/mL (ref <0.30)

## 2012-11-17 LAB — MAGNESIUM: Magnesium: 2.1 mg/dL (ref 1.5–2.5)

## 2012-11-17 MED ORDER — LORAZEPAM 0.5 MG PO TABS
0.5000 mg | ORAL_TABLET | Freq: Two times a day (BID) | ORAL | Status: DC | PRN
  Administered 2012-11-18 – 2012-11-19 (×2): 0.5 mg via ORAL
  Filled 2012-11-17 (×2): qty 1

## 2012-11-17 MED ORDER — FENTANYL CITRATE 0.05 MG/ML IJ SOLN
50.0000 ug | Freq: Once | INTRAMUSCULAR | Status: AC
  Administered 2012-11-17: 50 ug via INTRAVENOUS
  Filled 2012-11-17: qty 2

## 2012-11-17 MED ORDER — TORSEMIDE 20 MG PO TABS
40.0000 mg | ORAL_TABLET | ORAL | Status: DC
  Administered 2012-11-18 – 2012-11-19 (×2): 40 mg via ORAL
  Filled 2012-11-17 (×3): qty 2

## 2012-11-17 MED ORDER — CARVEDILOL 3.125 MG PO TABS
3.1250 mg | ORAL_TABLET | Freq: Two times a day (BID) | ORAL | Status: DC
Start: 2012-11-17 — End: 2012-11-17
  Filled 2012-11-17 (×3): qty 1

## 2012-11-17 MED ORDER — APIXABAN 5 MG PO TABS
5.0000 mg | ORAL_TABLET | Freq: Two times a day (BID) | ORAL | Status: DC
  Administered 2012-11-17 – 2012-11-20 (×7): 5 mg via ORAL
  Filled 2012-11-17 (×8): qty 1

## 2012-11-17 MED ORDER — MAGNESIUM OXIDE 400 (241.3 MG) MG PO TABS: 400.0000 mg | ORAL_TABLET | Freq: Two times a day (BID) | ORAL | Status: DC

## 2012-11-17 MED ORDER — SODIUM CHLORIDE 0.9 % IV SOLN: 250.0000 mL | INTRAVENOUS | Status: DC | PRN

## 2012-11-17 MED ORDER — POTASSIUM CHLORIDE CRYS ER 20 MEQ PO TBCR
20.0000 meq | EXTENDED_RELEASE_TABLET | Freq: Once | ORAL | Status: AC
  Administered 2012-11-17: 10:00:00 20 meq via ORAL
  Filled 2012-11-17: qty 1

## 2012-11-17 MED ORDER — TORSEMIDE 20 MG PO TABS
40.0000 mg | ORAL_TABLET | Freq: Two times a day (BID) | ORAL | Status: DC
  Administered 2012-11-17: 10:00:00 40 mg via ORAL
  Filled 2012-11-17 (×3): qty 2

## 2012-11-17 MED ORDER — SODIUM CHLORIDE 0.9 % IJ SOLN
3.0000 mL | Freq: Two times a day (BID) | INTRAMUSCULAR | Status: DC
  Administered 2012-11-17 – 2012-11-20 (×7): 3 mL via INTRAVENOUS

## 2012-11-17 MED ORDER — SODIUM CHLORIDE 0.9 % IJ SOLN: 3.0000 mL | INTRAMUSCULAR | Status: DC | PRN

## 2012-11-17 MED ORDER — AMIODARONE HCL 200 MG PO TABS
200.0000 mg | ORAL_TABLET | Freq: Two times a day (BID) | ORAL | Status: DC
  Administered 2012-11-17: 200 mg via ORAL
  Filled 2012-11-17 (×2): qty 1

## 2012-11-17 MED ORDER — CARVEDILOL 3.125 MG PO TABS: 3.1250 mg | ORAL_TABLET | Freq: Two times a day (BID) | ORAL | Status: DC

## 2012-11-17 MED ORDER — ALLOPURINOL 100 MG PO TABS
100.0000 mg | ORAL_TABLET | Freq: Every day | ORAL | Status: DC
  Administered 2012-11-17 – 2012-11-20 (×4): 100 mg via ORAL
  Filled 2012-11-17 (×4): qty 1

## 2012-11-17 MED ORDER — ZOLPIDEM TARTRATE 5 MG PO TABS
5.0000 mg | ORAL_TABLET | Freq: Once | ORAL | Status: AC
  Administered 2012-11-17: 5 mg via ORAL
  Filled 2012-11-17: qty 1

## 2012-11-17 MED ORDER — TORSEMIDE 20 MG PO TABS
40.0000 mg | ORAL_TABLET | Freq: Two times a day (BID) | ORAL | Status: DC
  Filled 2012-11-17 (×3): qty 2

## 2012-11-17 MED ORDER — TORSEMIDE 20 MG PO TABS
20.0000 mg | ORAL_TABLET | Freq: Every evening | ORAL | Status: DC
  Administered 2012-11-17 – 2012-11-18 (×2): 20 mg via ORAL
  Filled 2012-11-17 (×3): qty 1

## 2012-11-17 MED ORDER — FLUTICASONE PROPIONATE 50 MCG/ACT NA SUSP: 2.0000 | Freq: Every day | NASAL | Status: DC | PRN

## 2012-11-17 MED ORDER — MORPHINE SULFATE 2 MG/ML IJ SOLN: 2.0000 mg | Freq: Once | INTRAMUSCULAR | Status: AC

## 2012-11-17 MED ORDER — CARVEDILOL 3.125 MG PO TABS
3.1250 mg | ORAL_TABLET | Freq: Two times a day (BID) | ORAL | Status: DC
  Administered 2012-11-17 – 2012-11-19 (×5): 3.125 mg via ORAL
  Filled 2012-11-17 (×8): qty 1

## 2012-11-17 MED ORDER — MORPHINE SULFATE 2 MG/ML IJ SOLN
2.0000 mg | Freq: Once | INTRAMUSCULAR | Status: AC
  Administered 2012-11-17: 2 mg via INTRAVENOUS
  Filled 2012-11-17: qty 1

## 2012-11-17 MED ORDER — AMIODARONE HCL 200 MG PO TABS
400.0000 mg | ORAL_TABLET | Freq: Two times a day (BID) | ORAL | Status: DC
  Administered 2012-11-17 – 2012-11-18 (×2): 400 mg via ORAL
  Filled 2012-11-17 (×3): qty 2

## 2012-11-17 MED ORDER — SPIRONOLACTONE 12.5 MG HALF TABLET
12.5000 mg | ORAL_TABLET | Freq: Every day | ORAL | Status: DC
  Administered 2012-11-17 – 2012-11-20 (×4): 12.5 mg via ORAL
  Filled 2012-11-17 (×4): qty 1

## 2012-11-17 MED ORDER — ONDANSETRON HCL 4 MG/2ML IJ SOLN
4.0000 mg | Freq: Four times a day (QID) | INTRAMUSCULAR | Status: DC | PRN
  Administered 2012-11-17 (×2): 4 mg via INTRAVENOUS
  Filled 2012-11-17 (×2): qty 2

## 2012-11-17 MED ORDER — ACETAMINOPHEN 325 MG PO TABS: 650.0000 mg | ORAL_TABLET | ORAL | Status: DC | PRN

## 2012-11-17 MED ORDER — SODIUM CHLORIDE 0.9 % IJ SOLN
10.0000 mL | INTRAMUSCULAR | Status: DC | PRN
  Administered 2012-11-18: 07:00:00 30 mL
  Administered 2012-11-19: 10 mL

## 2012-11-17 MED ORDER — PANTOPRAZOLE SODIUM 40 MG PO TBEC
40.0000 mg | DELAYED_RELEASE_TABLET | Freq: Every day | ORAL | Status: DC
  Administered 2012-11-17 – 2012-11-20 (×4): 40 mg via ORAL
  Filled 2012-11-17 (×4): qty 1

## 2012-11-17 MED ORDER — MILRINONE IN DEXTROSE 20 MG/100ML IV SOLN
0.3750 ug/kg/min | INTRAVENOUS | Status: DC
  Administered 2012-11-18: 13:00:00 0.375 ug/kg/min via INTRAVENOUS

## 2012-11-17 MED ORDER — POTASSIUM CHLORIDE CRYS ER 20 MEQ PO TBCR
80.0000 meq | EXTENDED_RELEASE_TABLET | Freq: Two times a day (BID) | ORAL | Status: DC
  Administered 2012-11-17: 80 meq via ORAL
  Filled 2012-11-17 (×3): qty 4

## 2012-11-17 MED ORDER — POTASSIUM CHLORIDE CRYS ER 20 MEQ PO TBCR
60.0000 meq | EXTENDED_RELEASE_TABLET | Freq: Two times a day (BID) | ORAL | Status: DC
  Administered 2012-11-17: 10:00:00 60 meq via ORAL
  Filled 2012-11-17: qty 3

## 2012-11-17 MED ORDER — METOLAZONE 2.5 MG PO TABS: 2.5000 mg | ORAL_TABLET | ORAL | Status: DC

## 2012-11-17 MED ORDER — MORPHINE SULFATE 2 MG/ML IJ SOLN
2.0000 mg | INTRAMUSCULAR | Status: DC | PRN
  Administered 2012-11-17 – 2012-11-20 (×8): 2 mg via INTRAVENOUS
  Filled 2012-11-17 (×9): qty 1

## 2012-11-17 NOTE — ED Provider Notes (Signed)
0030 - CAR consulted - Dr Don Broach to evaluated in the ED, requests hospitalist admit to [facility]    Darrell Nielsen, MD 11/17/12 [phone]

## 2012-11-17 NOTE — Progress Notes (Signed)
Utilization Review completed.  

## 2012-11-17 NOTE — ED Notes (Addendum)
Pt is on Milrinone 0.361mcg/kg/min or 2.4cc/hr via home IV infusion to right subclavian.  Concentration is 420mg /461ml.

## 2012-11-17 NOTE — Progress Notes (Signed)
Patient arrived from ED via stretcher.  Pt. Ambulated to bed with minimal assist.  Patient alert and oriented x4 and pain free.  Pt. Has home milrinone drip running until pharmacy sends up hospital dose of milrinone.  Will continue to monitor patient.

## 2012-11-17 NOTE — ED Notes (Signed)
Pt is on Milirinone gtt via left subclavian line.

## 2012-11-17 NOTE — H&P (Addendum)
[facility] History and Physical  JAESEAN LITZAU ZOX:[id number] DOB: [birth year] DOA: 11/16/2012  Referring physician: EDP PCP: Arvilla Meres, MD  Specialists:   Chief Complaint:  Chest Pain, Palpitations and SOB  HPI: [name] is a 62 y.o. male with a history of end Stage Chronic Systolic CHF and Non-Ischemic Cardiomyopathy with an EF= 15 % (on 2-D ECHO 03/2102) and Atrial fibrillation S/P Multiple Cardioversions in the past with a recent cardioversion on 11/08/2012 who presents to the ED with complaints of mid-chest area chest pain and palpitations as well as worsening SOB.  He denies any fever chills or nausea or vomiting.   The pain is crushing and sharp, and he rates his chest pain as an 8/10 at the worse.  The pain is in the mid-chest and radiates across his chest.  He has chronic SOB, and Orthopnea and DOE.   He is followed by Dr Clarise Cruz and is on a continuous Milrinone infusion.       Review of Systems: The patient denies anorexia, fever, chills, headaches, weight loss, vision loss, diplopia, dizziness, decreased hearing, rhinitis, hoarseness, syncope, peripheral edema, balance deficits, cough, hemoptysis, abdominal pain, nausea, vomiting, diarrhea, constipation, hematemesis, melena, hematochezia, severe indigestion/heartburn, dysuria, hematuria, incontinence, muscle weakness, suspicious skin lesions, transient blindness, difficulty walking, depression, unusual weight change, abnormal bleeding, enlarged lymph nodes, angioedema, and breast masses.    Past Medical History  Diagnosis Date  . CHF (congestive heart failure)   . Sarcoidosis   . Cardiomyopathy, dilated, nonischemic     non ischemic by cath  . Acute on chronic systolic heart failure   . Automatic implantable cardiac defibrillator in situ   . Atrial fibrillation   . NSVT (nonsustained ventricular tachycardia)   . GERD (gastroesophageal reflux disease)   . Hypercholesteremia   . Myocardial infarction    . Shortness of breath   . Chronic kidney disease (CKD), stage III (moderate)   . Pacemaker   . Anginal pain   . Gout   . Hypertension     [name]  . Coronary artery disease     Past Surgical History  Procedure Laterality Date  . Back surgery  1987    Ruptured disk repair  . Pacemaker insertion      with ICD  . Tee without cardioversion  01/17/2011    Procedure: TRANSESOPHAGEAL ECHOCARDIOGRAM (TEE);  Surgeon: Ricki Rodriguez, MD;  Location: [facility] ENDOSCOPY;  Service: Cardiovascular;  Laterality: N/A;  . Cardioversion  01/17/2011    Procedure: CARDIOVERSION;  Surgeon: Ricki Rodriguez, MD;  Location: [facility] ENDOSCOPY;  Service: Cardiovascular;  Laterality: N/A;  . Cardiac catheterization    . Insert / replace / remove pacemaker    . Anterior cervical decomp/discectomy fusion  08/21/2011    Procedure: ANTERIOR CERVICAL DECOMPRESSION/DISCECTOMY FUSION 2 LEVELS;  Surgeon: Eldred Manges, MD;  Location: [facility] OR;  Service: Orthopedics;  Laterality: N/A;  C5-6, C6-7 Anterior Cervical Discectomy and Fusion, allograft, plate  . Tee without cardioversion N/A 02/16/2012    Procedure: TRANSESOPHAGEAL ECHOCARDIOGRAM (TEE);  Surgeon: Dolores Patty, MD;  Location: [facility] ENDOSCOPY;  Service: Cardiovascular;  Laterality: N/A;  original case scheduled under his dad (who is deceased), rescheduled under correct mrn/pt/dob. Bassett/dl  . Tee without cardioversion N/A 03/22/2012    Procedure: TRANSESOPHAGEAL ECHOCARDIOGRAM (TEE);  Surgeon: Lewayne Bunting, MD;  Location: [facility] ENDOSCOPY;  Service: Cardiovascular;  Laterality: N/A;  . Cardioversion N/A 03/22/2012    Procedure: CARDIOVERSION;  Surgeon: Lewayne Bunting, MD;  Location:  [facility] ENDOSCOPY;  Service: Cardiovascular;  Laterality: N/A;  . Cardioversion N/A 04/26/2012    Procedure: CARDIOVERSION;  Surgeon: Dolores Patty, MD;  Location: [facility] ENDOSCOPY;  Service: Cardiovascular;  Laterality: N/A;  . Central venous catheter tunneled insertion single lumen  09/16/2012     right IJ  . Cardioversion N/A 11/08/2012    Procedure: CARDIOVERSION;  Surgeon: Dolores Patty, MD;  Location: [facility] ENDOSCOPY;  Service: Cardiovascular;  Laterality: N/A;    Prior to Admission medications   Medication Sig Start Date End Date Taking? Authorizing Provider  allopurinol (ZYLOPRIM) 100 MG tablet Take 1 tablet (100 mg total) by mouth daily. 02/05/12  Yes Hadassah Pais, PA-C  amiodarone (PACERONE) 200 MG tablet Take 200 mg by mouth 2 (two) times daily.   Yes Historical Provider, MD  apixaban (ELIQUIS) 5 MG TABS tablet Take 1 tablet (5 mg total) by mouth 2 (two) times daily. 10/09/12  Yes Aundria Rud, NP  carvedilol (COREG) 3.125 MG tablet Take 3.125 mg by mouth 2 (two) times daily with a meal.   Yes Historical Provider, MD  esomeprazole (NEXIUM) 40 MG capsule Take 40 mg by mouth daily as needed (for acid reflux).    Yes Historical Provider, MD  fluticasone (FLONASE) 50 MCG/ACT nasal spray Place 2 sprays into both nostrils daily as needed for allergies.    Yes Historical Provider, MD  LORazepam (ATIVAN) 0.5 MG tablet Take 0.5 mg by mouth 2 (two) times daily as needed for anxiety.   Yes Historical Provider, MD  metolazone (ZAROXOLYN) 2.5 MG tablet Take 1 tablet (2.5 mg total) by mouth See admin instructions. Patient takes as directed by MD for fluid retention 07/24/12  Yes Dolores Patty, MD  milrinone [facility]) 20 MG/100ML SOLN infusion Inject 37.3125 mcg/min into the vein continuous. 03/01/12  Yes Hadassah Pais, PA-C  potassium chloride SA (K-DUR,KLOR-CON) 20 MEQ tablet Take 3 tablets (60 mEq total) by mouth 2 (two) times daily. 10/09/12  Yes Aundria Rud, NP  sildenafil (VIAGRA) 100 MG tablet Take 1 tablet (100 mg total) by mouth daily as needed for erectile dysfunction. 06/10/12  Yes Amy D Clegg, NP  spironolactone (ALDACTONE) 25 MG tablet Take 0.5 tablets (12.5 mg total) by mouth daily. 09/27/12  Yes Dolores Patty, MD  torsemide (DEMADEX) 20 MG tablet Take 40 mg in AM and  20 mg in PM 07/10/12  Yes Amy D Clegg, NP    Allergies  Allergen Reactions  . Nitroglycerin Other (See Comments)    "feels like head is going to bust open"    Social History:            He denies tobacco Usage and He denies ETOH and Illicit Drug Usage at this time reports that he quit years ago.        Family History  Problem Relation Age of Onset  . Heart disease    . Heart failure    . Stroke    . Anesthesia problems Neg Hx   . Hypotension Neg Hx   . Malignant hyperthermia Neg Hx   . Pseudochol deficiency Neg Hx       Physical Exam:  GEN:  Pleasant Ill appearing elderly 62 y.o. African American male  examined  and in no acute distress; cooperative with exam Filed Vitals:   11/17/12 0335 11/17/12 0430 11/17/12 0546 11/17/12 0618  BP:  111/84 112/85 103/63  Pulse: 83 83 81 90  Temp:      TempSrc:  Resp:  19 20 18   Height:      Weight:      SpO2:  97% 99% 99%   Blood pressure 103/63, pulse 90, temperature 97.9 F (36.6 C), temperature source Oral, resp. rate 18, height 6\' 2"  (1.88 m), weight 107.502 kg (237 lb), SpO2 99.00%. PSYCH: He is alert and oriented x4; does not appear anxious does not appear depressed; affect is normal HEENT: Normocephalic and Atraumatic, Mucous membranes pink; PERRLA; EOM intact; Fundi:  Benign;  No scleral icterus, Nares: Patent, Oropharynx: Clear, Fair Dentition, Neck:  FROM, no cervical lymphadenopathy nor thyromegaly or carotid bruit; no JVD; Breasts:: Not examined CHEST WALL: No tenderness CHEST: Normal respiration, clear to auscultation bilaterally HEART: Regular rate and rhythm; no murmurs rubs or gallops BACK: No kyphosis or scoliosis; no CVA tenderness ABDOMEN: Positive Bowel Sounds, soft non-tender; no masses, no organomegaly. Rectal Exam: Not done EXTREMITIES: No  cyanosis, clubbing or edema; no ulcerations. Genitalia: not examined PULSES: 2+ and symmetric SKIN: Normal hydration no rash or ulceration CNS: Cranial nerves  2-12 grossly intact no focal neurologic deficit    Labs on Admission:  Basic Metabolic Panel:  Recent Labs Lab 11/16/12 2220  NA 134*  K 3.0*  CL 97  CO2 25  GLUCOSE 112*  BUN 67*  CREATININE 2.80*  CALCIUM 10.2   Liver Function Tests: No results found for this basename: AST, ALT, ALKPHOS, BILITOT, PROT, ALBUMIN,  in the last 168 hours No results found for this basename: LIPASE, AMYLASE,  in the last 168 hours No results found for this basename: AMMONIA,  in the last 168 hours CBC:  Recent Labs Lab 11/16/12 2220  WBC 5.2  HGB 15.3  HCT 41.9  MCV 87.8  PLT 245   Cardiac Enzymes: No results found for this basename: CKTOTAL, CKMB, CKMBINDEX, TROPONINI,  in the last 168 hours  BNP (last 3 results)  Recent Labs  02/18/12 2210 09/19/12 0640 11/16/12 2220  PROBNP 3490.0* 596.8* 281.2*   CBG: No results found for this basename: GLUCAP,  in the last 168 hours  Radiological Exams on Admission: Dg Chest Port 1 View  11/16/2012   CLINICAL DATA:  CHEST PAIN .  EXAM: PORTABLE CHEST - 1 VIEW  COMPARISON:  DG CHEST 1V PORT dated 09/19/2012  FINDINGS: Left-sided pacemaker with continuous lesion overlies normal cardiac silhouette. Right central venous line is unchanged. No effusion, infiltrate, or pneumothorax. Anterior cervical fusion noted.  IMPRESSION: No acute cardiopulmonary process.   Electronically Signed   By: Genevive Bi M.D.   On: 11/16/2012 22:50     EKG: Independently reviewed. Sinus Rhythm with premature atrial contractions.      Assessment/Plan Principal Problem:   Systolic CHF, acute on chronic Active Problems:   Chest pain, mid sternal   Cardiomyopathy, dilated, nonischemic   Atrial fibrillation   Hypokalemia   Hypotension   GASTROESOPHAGEAL REFLUX DISEASE   CHRONIC KIDNEY DISEASE STAGE II (MILD)   ICD-St.Jude   1.   Systolic CHF(Endstage)-  EF=15%,  Placed on the CHF protocol, to diurese as his Blood Pressures tolerate. Notify Dr, Clarise Cruz  in the AM.    2.   Chest Pain- cycle troponins,  (Note ALLERGY to NTG).     3.    Atrial fibrillation- continue Amiodarone Rx, and Eliquis.  Check TSH level.    4.    Hypokalemia- Replete K+ .  Caused by diuretic Rx.    5.    Hypotension- due to Diuretics and Anti-Hypertensives.  6.    GERD- continue on Protonix Rx.    7.     CKD stage   II- III-  monitor BUN/Cr.     8.     ICD/pacer - on Eliquis Rx.    9.     Other - He is a  Full Code on Hospice as an Outpatient.       Code Status:     FULL CODE  Family Communication:   No Family Present Disposition Plan:       Inpatient  Time spent:  44 Minutes  Ron Parker [facility] Pager [phone]  If 7PM-7AM, please contact night-coverage [email or web address] Password [facility] 11/17/2012, 6:45 AM

## 2012-11-17 NOTE — Progress Notes (Signed)
Patient ID: [name], male   DOB: January 25, 1950, 62 y.o.   MRN: [id number]   SUBJECTIVE: Patient is not having CP this morning and is in NSR.  However, overnight he appears to have had a run of atrial flutter, rate around 140.  He says that he came in yesterday because he felt his heart race and pound (feels like prior atrial flutter) that was associated with severe chest pain.  Troponin negative, chest pain resolved.  Marland Kitchen allopurinol  100 mg Oral Daily  . amiodarone  400 mg Oral BID  . apixaban  5 mg Oral BID  . carvedilol  3.125 mg Oral BID WC  . pantoprazole  40 mg Oral Daily  . potassium chloride SA  80 mEq Oral BID  . sodium chloride  3 mL Intravenous Q12H  . spironolactone  12.5 mg Oral Daily  . torsemide  40 mg Oral BID  milrinone gtt    Filed Vitals:   11/17/12 0546 11/17/12 0618 11/17/12 0648 11/17/12 0951  BP: 112/85 103/63 102/73 102/72  Pulse: 81 90 90 80  Temp:   97.5 F (36.4 C) 97.6 F (36.4 C)  TempSrc:   Oral Oral  Resp: 20 18 20 20   Height:   6\' 3"  (1.905 m)   Weight:   233 lb 14.5 oz (106.1 kg)   SpO2: 99% 99% 98% 98%    Intake/Output Summary (Last 24 hours) at 11/17/12 1304 Last data filed at 11/17/12 0900  Gross per 24 hour  Intake   1140 ml  Output      0 ml  Net   1140 ml    LABS: Basic Metabolic Panel:  Recent Labs  40/98/11 2220 11/17/12 0800  NA 134*  --   K 3.0*  --   CL 97  --   CO2 25  --   GLUCOSE 112*  --   BUN 67*  --   CREATININE 2.80*  --   CALCIUM 10.2  --   MG  --  2.1   Liver Function Tests: No results found for this basename: AST, ALT, ALKPHOS, BILITOT, PROT, ALBUMIN,  in the last 72 hours No results found for this basename: LIPASE, AMYLASE,  in the last 72 hours CBC:  Recent Labs  11/16/12 2220  WBC 5.2  HGB 15.3  HCT 41.9  MCV 87.8  PLT 245   Cardiac Enzymes:  Recent Labs  11/17/12 0800  TROPONINI <0.30   BNP: No components found with this basename: POCBNP,  D-Dimer: No results found for this  basename: DDIMER,  in the last 72 hours Hemoglobin A1C: No results found for this basename: HGBA1C,  in the last 72 hours Fasting Lipid Panel: No results found for this basename: CHOL, HDL, LDLCALC, TRIG, CHOLHDL, LDLDIRECT,  in the last 72 hours Thyroid Function Tests: No results found for this basename: TSH, T4TOTAL, FREET3, T3FREE, THYROIDAB,  in the last 72 hours Anemia Panel: No results found for this basename: VITAMINB12, FOLATE, FERRITIN, TIBC, IRON, RETICCTPCT,  in the last 72 hours  RADIOLOGY: Dg Chest Port 1 View  11/16/2012   CLINICAL DATA:  CHEST PAIN .  EXAM: PORTABLE CHEST - 1 VIEW  COMPARISON:  DG CHEST 1V PORT dated 09/19/2012  FINDINGS: Left-sided pacemaker with continuous lesion overlies normal cardiac silhouette. Right central venous line is unchanged. No effusion, infiltrate, or pneumothorax. Anterior cervical fusion noted.  IMPRESSION: No acute cardiopulmonary process.   Electronically Signed   By: Loura Halt.D.  On: 11/16/2012 22:50    PHYSICAL EXAM General: NAD Neck: No JVD, no thyromegaly or thyroid nodule.  Lungs: Clear to auscultation bilaterally with normal respiratory effort. CV: Nondisplaced PMI.  Heart regular S1/S2, +S3, no murmur.  No peripheral edema.   Abdomen: Soft, nontender, no hepatosplenomegaly, no distention.  Neurologic: Alert and oriented x 3.  Psych: Normal affect. Extremities: No clubbing or cyanosis.   TELEMETRY: Reviewed telemetry pt in NSR   ASSESSMENT AND PLAN: 62 yo with history of nonischemic CMP on home milrinone and home hospice, paroxysmal atrial flutter and CKD presented with chest pain associated with tachypalpitations.  1. Chest pain/tachypalpitations: Suspect recurrent atrial flutter.  He had atrial flutter briefly overnight and is now in NSR.  He feels fine now.  Historically, his atrial flutter has been difficult to control. He is hypokalemic despite high doses of K at home.  - Increase KCl to 80 bid.  - Increase  amiodarone to 400 mg bid - Increase Coreg to 3.125 mg bid.  - Watch overnight to see if atrial flutter recurs.  2. Chronic systolic CHF: Continue milrinone and home torsemide.  He does not appear significantly volume overloaded.  3. CKD: Actually improved from most recent prior.   Marca Ancona 11/17/2012 1:12 PM

## 2012-11-17 NOTE — Progress Notes (Signed)
[facility] PROGRESS NOTE Interim History: 62 y.o.male is a 62 y.o. gentlemen with severe HF due to NICM (EF 15%) with multiple hospital admissions for HF exacerbations. He also has history of ventricular tachycardia s/p St Jude ICD by [name], chronic kidney disease: stage III, baseline Cr ~2, and paroxysmal atrial arrhythmias controlled s/p multiple cardioversion on amiodarone. As well as sarcoidosis, hypertension. Cath in 2008 by Dr. [name] showed no CAD. Dry weight is 219 (11/2011). Milrinone initiated in January for low output. Comes in for mid-sternal chest pain.  Filed Weights   11/16/12 2158 11/17/12 0648  Weight: 107.502 kg (237 lb) 106.1 kg (233 lb 14.5 oz)        Intake/Output Summary (Last 24 hours) at 11/17/12 1009 Last data filed at 11/17/12 0900  Gross per 24 hour  Intake   1140 ml  Output      0 ml  Net   1140 ml     Assessment/Plan: *Chest pain, mid sternal/ Atrial fibrillation: - He relates he felt palpitation then started having chest pain. - First set cardiac enzymes negative. Cont to cycle. - EKG showed prolong QTc improved from the previous EKG. With some atrial ectopy. - Potasium is low, which is probably contributing to his ectopy, check a mag and replete, moniotr electrolytes. - Cont current dose of milrinone (on home dose infusion pump) and amiodarone. - Some alarm on telemetry not sure it is V-tach  Systolic CHF, acute on chronic/Cardiomyopathy, dilated, nonischemic: - Bp stable, only  with mild hyponatremia. - Cont diuretics, cannot appreciate JVD.  Hypokalemia: - Replete check a mag. - On aldactone.  CHRONIC KIDNEY DISEASE STAGE II (MILD) - at baseline Cr.  GASTROESOPHAGEAL REFLUX DISEASE - PPI, no pain on Physical exam. - Hbg stable, no melanotic stool or BRBPR.     Code Status: FULL CODE  Family Communication: No Family Present  Disposition Plan: Inpatient    Consultants:  cardiology  Procedures: ECHO:  none  Antibiotics:  none  HPI/Subjective: He relates he feels much better than when he came in.  Objective: Filed Vitals:   11/17/12 0546 11/17/12 0618 11/17/12 0648 11/17/12 0951  BP: 112/85 103/63 102/73 102/72  Pulse: 81 90 90 80  Temp:   97.5 F (36.4 C) 97.6 F (36.4 C)  TempSrc:   Oral Oral  Resp: 20 18 20 20   Height:   6\' 3"  (1.905 m)   Weight:   106.1 kg (233 lb 14.5 oz)   SpO2: 99% 99% 98% 98%     Exam:  General: Alert, awake, oriented x3, in no acute distress.  HEENT: No bruits, no goiter. -JVD Heart: Regular rate and rhythm, without murmurs, rubs, gallops.  Lungs: Good air movement, bilateral air movement.  Abdomen: Soft, nontender, nondistended, positive bowel sounds.  Neuro: Grossly intact, nonfocal.   Data Reviewed: Basic Metabolic Panel:  Recent Labs Lab 11/16/12 2220  NA 134*  K 3.0*  CL 97  CO2 25  GLUCOSE 112*  BUN 67*  CREATININE 2.80*  CALCIUM 10.2   Liver Function Tests: No results found for this basename: AST, ALT, ALKPHOS, BILITOT, PROT, ALBUMIN,  in the last 168 hours No results found for this basename: LIPASE, AMYLASE,  in the last 168 hours No results found for this basename: AMMONIA,  in the last 168 hours CBC:  Recent Labs Lab 11/16/12 2220  WBC 5.2  HGB 15.3  HCT 41.9  MCV 87.8  PLT 245   Cardiac Enzymes:  Recent Labs Lab 11/17/12  0800  TROPONINI <0.30   BNP (last 3 results)  Recent Labs  02/18/12 2210 09/19/12 0640 11/16/12 2220  PROBNP 3490.0* 596.8* 281.2*   CBG: No results found for this basename: GLUCAP,  in the last 168 hours  No results found for this or any previous visit (from the past 240 hour(s)).   Studies: Dg Chest Port 1 View  11/16/2012   CLINICAL DATA:  CHEST PAIN .  EXAM: PORTABLE CHEST - 1 VIEW  COMPARISON:  DG CHEST 1V PORT dated 09/19/2012  FINDINGS: Left-sided pacemaker with continuous lesion overlies normal cardiac silhouette. Right central venous line is unchanged. No effusion,  infiltrate, or pneumothorax. Anterior cervical fusion noted.  IMPRESSION: No acute cardiopulmonary process.   Electronically Signed   By: Genevive Bi M.D.   On: 11/16/2012 22:50    Scheduled Meds: . allopurinol  100 mg Oral Daily  . amiodarone  200 mg Oral BID  . apixaban  5 mg Oral BID  . pantoprazole  40 mg Oral Daily  . potassium chloride  20 mEq Oral Once  . potassium chloride SA  60 mEq Oral BID  . sodium chloride  3 mL Intravenous Q12H  . spironolactone  12.5 mg Oral Daily  . torsemide  40 mg Oral BID   Continuous Infusions: . milrinone       Marinda Elk  [facility] Pager [phone]. If 8PM-8AM, please contact night-coverage at [email or web address], password [facility] 11/17/2012, 10:09 AM  LOS: 1 day

## 2012-11-17 NOTE — Progress Notes (Signed)
Pt c/o 10/10 sever lt sided chest pain. EKG obtained no changes HR elevated 120s  [facility] notified. Orders to give dose of iv Morphine. Pain gradually relieving. Place on 2L [place] SPo2 100% will monitor.

## 2012-11-17 NOTE — Progress Notes (Signed)
Pt. Had another episode of 8/10 chest pain.  Heart rate up to 120s.  Otherwise VSS.  Morphine given as per ordered.  Will continue to monitor patient.  Dr. Donnie Aho on call made aware.

## 2012-11-17 NOTE — Consult Note (Signed)
Cardiology Consultation Note  Patient ID: [name], MRN: [id number], DOB/AGE: 04/24/1950 62 y.o. Admit date: 11/16/2012   Date of Consult: 11/17/2012 Primary Physician: Arvilla Meres, MD Primary Cardiologist:    Ass / Plan   1) Chronic systolic HF: Nonischemic cardiomyopathy with EF 15% (03/2012) likely exacerbation a nature of the progressive End stage CMP and flutter .   - Continue milrinone 0.375 mcg through [facility] cath  - Remain on coreg 3.125 mg BID and Spiro 12.5 mg daily (for severe hypokalemia)  - No ACEI with CKD   2) Atrial fibrillation/flutter: Paroxysmal  He is in NSR today but has frequent bothersome tachypalpitations. Increasing amiodarone did not help.  - Continue amiodarone to 200 mg bid and apixaban 5 mg BID, currently in NSR , so cardioversion would not be a consideration    3) CKD, stage IV  - Continue to monitor Cr   4) Hypokalemia ; replete before any  Diuresis attempt   5) Prolonged QTc; replete K  check Mag   6 ) Home Hospice at [facility]   Chief Complaint: SOB  Reason for Consult; CHF exacerbation   Weight Range  219-221 pounds   Baseline proBNP  511 on 11/13/11   HPI:  [name] is a 62 y.o. gentlemen with severe HF due to NICM (EF 20-25%) with multiple hospital admissions for HF exacerbations. He also has history of ventricular tachycardia s/p St Jude ICD by [name], CKD: stage IV, baseline Cr ~2, and paroxysmal atrial arrhythmias on amiodarone as well as sarcoidosis and hypertension. Cath in 2008 by Dr. [name] showed no CAD.Seen at [facility] and not felt to be a transplant candidate due to renal failure and lack of family support.  Milrinone initiated in January 2014 for low output. 03/01/12 milrinone increased 0.373mc/kg/min.  He is not on an ace inhibitor due to elevated creatinine. Not on nitrates due to Viagra use.  Currently he reports increased fatigue and generalized discomfort, with vague feeling of fluid built up in his abdomen. Reports  Orthopnea, PND , no LE edema,  . Reports compliance with medications. States that he felt his heart became fast and was concerned about SVT and associated chest discomfort which is why he came in . Denies any shock therapy   ROS per HPI    Recent cardiac work up  RHC 01/26/12  RA = 14  RV = 51/16/15  PA = 57/34 (43)  PCW = 26  Fick cardiac output/index = 4.2/1.8  Thermo CO/CI = 3.9/1.7  PVR = 4.0 [place]  O2 sat = 97%  PA sat = 57%, 58%  Ao Pressure (non-invasive): 116/80 (93)  SVR = 1504  ECHO 03/22/12 EF 15%  04/26/12 S/P Successful DC-CV for atrial fibrillation. Remains on amiodarone.  07/08/12 Creatinine 3.15 Potassium 3.3  07/15/12 Creatinine 3.21 Potassium 3.6  07/22/12 Creatinine 2.6 Potassium 3.5  08/12/12 Creatinine not correct 1.10, Potassium 3.1?  08/15/12 Creatinine 4.24 Potassium 3.3  09/19/12 Creatinine 3.98, Potassium 3.1, BUN 78  10/07/12 Creatinine 2.62, Potassium 3.4  10/18/12 Creatinine 3.3, K+3.1  11/16/2012 Cr 2.9 and K 3.0  He is not on an ace inhibitor due to elevated creatinine. Not on nitrates due to Viagra use.     Past Medical History  Diagnosis Date  . CHF (congestive heart failure)   . Sarcoidosis   . Cardiomyopathy, dilated, nonischemic     non ischemic by cath  . Acute on chronic systolic heart failure   . Automatic implantable cardiac defibrillator in  situ   . Atrial fibrillation   . NSVT (nonsustained ventricular tachycardia)   . GERD (gastroesophageal reflux disease)   . Hypercholesteremia   . Myocardial infarction   . Shortness of breath   . Chronic kidney disease (CKD), stage III (moderate)   . Pacemaker   . Anginal pain   . Gout   . Hypertension     [name]  . Coronary artery disease        Surgical History:  Past Surgical History  Procedure Laterality Date  . Back surgery  1987    Ruptured disk repair  . Pacemaker insertion      with ICD  . Tee without cardioversion  01/17/2011    Procedure: TRANSESOPHAGEAL ECHOCARDIOGRAM  (TEE);  Surgeon: Ricki Rodriguez, MD;  Location: [facility] ENDOSCOPY;  Service: Cardiovascular;  Laterality: N/A;  . Cardioversion  01/17/2011    Procedure: CARDIOVERSION;  Surgeon: Ricki Rodriguez, MD;  Location: [facility] ENDOSCOPY;  Service: Cardiovascular;  Laterality: N/A;  . Cardiac catheterization    . Insert / replace / remove pacemaker    . Anterior cervical decomp/discectomy fusion  08/21/2011    Procedure: ANTERIOR CERVICAL DECOMPRESSION/DISCECTOMY FUSION 2 LEVELS;  Surgeon: Eldred Manges, MD;  Location: [facility] OR;  Service: Orthopedics;  Laterality: N/A;  C5-6, C6-7 Anterior Cervical Discectomy and Fusion, allograft, plate  . Tee without cardioversion N/A 02/16/2012    Procedure: TRANSESOPHAGEAL ECHOCARDIOGRAM (TEE);  Surgeon: Dolores Patty, MD;  Location: [facility] ENDOSCOPY;  Service: Cardiovascular;  Laterality: N/A;  original case scheduled under his dad (who is deceased), rescheduled under correct mrn/pt/dob. [place]/dl  . Tee without cardioversion N/A 03/22/2012    Procedure: TRANSESOPHAGEAL ECHOCARDIOGRAM (TEE);  Surgeon: Lewayne Bunting, MD;  Location: [facility] ENDOSCOPY;  Service: Cardiovascular;  Laterality: N/A;  . Cardioversion N/A 03/22/2012    Procedure: CARDIOVERSION;  Surgeon: Lewayne Bunting, MD;  Location: [facility] ENDOSCOPY;  Service: Cardiovascular;  Laterality: N/A;  . Cardioversion N/A 04/26/2012    Procedure: CARDIOVERSION;  Surgeon: Dolores Patty, MD;  Location: [facility] ENDOSCOPY;  Service: Cardiovascular;  Laterality: N/A;  . Central venous catheter tunneled insertion single lumen  09/16/2012    right IJ  . Cardioversion N/A 11/08/2012    Procedure: CARDIOVERSION;  Surgeon: Dolores Patty, MD;  Location: [facility] ENDOSCOPY;  Service: Cardiovascular;  Laterality: N/A;     Home Meds: Prior to Admission medications   Medication Sig Start Date End Date Taking? Authorizing Provider  allopurinol (ZYLOPRIM) 100 MG tablet Take 1 tablet (100 mg total) by mouth daily. 02/05/12  Yes Hadassah Pais, PA-C    amiodarone (PACERONE) 200 MG tablet Take 200 mg by mouth 2 (two) times daily.   Yes Historical Provider, MD  apixaban (ELIQUIS) 5 MG TABS tablet Take 1 tablet (5 mg total) by mouth 2 (two) times daily. 10/09/12  Yes Aundria Rud, NP  carvedilol (COREG) 3.125 MG tablet Take 3.125 mg by mouth 2 (two) times daily with a meal.   Yes Historical Provider, MD  esomeprazole (NEXIUM) 40 MG capsule Take 40 mg by mouth daily as needed (for acid reflux).    Yes Historical Provider, MD  fluticasone (FLONASE) 50 MCG/ACT nasal spray Place 2 sprays into both nostrils daily as needed for allergies.    Yes Historical Provider, MD  LORazepam (ATIVAN) 0.5 MG tablet Take 0.5 mg by mouth 2 (two) times daily as needed for anxiety.   Yes Historical Provider, MD  metolazone (ZAROXOLYN) 2.5 MG tablet Take 1 tablet (2.5 mg  total) by mouth See admin instructions. Patient takes as directed by MD for fluid retention 07/24/12  Yes Dolores Patty, MD  milrinone [facility]) 20 MG/100ML SOLN infusion Inject 37.3125 mcg/min into the vein continuous. 03/01/12  Yes Hadassah Pais, PA-C  potassium chloride SA (K-DUR,KLOR-CON) 20 MEQ tablet Take 3 tablets (60 mEq total) by mouth 2 (two) times daily. 10/09/12  Yes Aundria Rud, NP  sildenafil (VIAGRA) 100 MG tablet Take 1 tablet (100 mg total) by mouth daily as needed for erectile dysfunction. 06/10/12  Yes Amy D Clegg, NP  spironolactone (ALDACTONE) 25 MG tablet Take 0.5 tablets (12.5 mg total) by mouth daily. 09/27/12  Yes Dolores Patty, MD  torsemide (DEMADEX) 20 MG tablet Take 40 mg in AM and 20 mg in PM 07/10/12  Yes Amy Georgie Chard, NP    Inpatient Medications:     Allergies:  Allergies  Allergen Reactions  . Nitroglycerin Other (See Comments)    "feels like head is going to bust open"    History   Social History  . Marital Status: Divorced    Spouse Name: N/A    Number of Children: N/A  . Years of Education: N/A   Occupational History  . Not on file.   Social  History Main Topics  . Smoking status: Never Smoker   . Smokeless tobacco: Never Used  . Alcohol Use: No  . Drug Use: Yes    Special: "Crack" cocaine, Marijuana, Cocaine     Comment: last use Novemenber  . Sexual Activity: Not Currently   Other Topics Concern  . Not on file   Social History Narrative  . No narrative on file     Family History  Problem Relation Age of Onset  . Heart disease    . Heart failure    . Stroke    . Anesthesia problems Neg Hx   . Hypotension Neg Hx   . Malignant hyperthermia Neg Hx   . Pseudochol deficiency Neg Hx        Labs: No results found for this basename: CKTOTAL, CKMB, TROPONINI,  in the last 72 hours Lab Results  Component Value Date   WBC 5.2 11/16/2012   HGB 15.3 11/16/2012   HCT 41.9 11/16/2012   MCV 87.8 11/16/2012   PLT 245 11/16/2012    Recent Labs Lab 11/16/12 2220  NA 134*  K 3.0*  CL 97  CO2 25  BUN 67*  CREATININE 2.80*  CALCIUM 10.2  GLUCOSE 112*   Lab Results  Component Value Date   CHOL 144 10/09/2011   HDL 70 10/09/2011   LDLCALC 55 10/09/2011   TRIG 96 10/09/2011   Lab Results  Component Value Date   DDIMER 0.26 01/15/2011    Radiology/Studies:  Dg Chest Port 1 View  11/16/2012   CLINICAL DATA:  CHEST PAIN .  EXAM: PORTABLE CHEST - 1 VIEW  COMPARISON:  DG CHEST 1V PORT dated 09/19/2012  FINDINGS: Left-sided pacemaker with continuous lesion overlies normal cardiac silhouette. Right central venous line is unchanged. No effusion, infiltrate, or pneumothorax. Anterior cervical fusion noted.  IMPRESSION: No acute cardiopulmonary process.   Electronically Signed   By: Genevive Bi M.D.   On: 11/16/2012 22:50    EKG: poor baseline unable to ascertain ryhthym , Prolonged QT @ 590  Physical Exam: Blood pressure 102/84, pulse 89, temperature 98.2 F (36.8 C), temperature source Oral, resp. rate 18, height 6\' 2"  (1.88 m), weight 107.502 kg (237 lb), SpO2 100.00%.  General: Well developed, mild discomfort    Head: Normocephalic, atraumatic, sclera non-icteric, no xanthomas, nares are without discharge.  Neck: Negative for carotid bruits. JVD not elevated. Lungs: Clear bilaterally to auscultation without wheezes, rales, or rhonchi. Breathing is unlabored. Heart: RRR with S1 S2. No murmurs, rubs, or gallops appreciated. Abdomen: Soft, non-tender, mildly distended with normoactive bowel sounds. No hepatomegaly. No rebound/guarding. No obvious abdominal masses. Extremities: No clubbing or cyanosis. No edema.  Distal pedal pulses are 2+ and equal bilaterally. Neuro: Alert and oriented X 3. No facial asymmetry. No focal deficit. Moves all extremities spontaneously. Psych:  Responds to questions appropriately        Signed, Deborah Chalk, A M.D  11/17/2012, 2:10 AM

## 2012-11-17 NOTE — Progress Notes (Signed)
Spoke with pt. regarding transfer IV home medication to hospital pump. Pt stated he usually remains on his own pump, and home health will come change medication on Monday. Pt called family to bring in medication,medication placed in refrigerator on the unit.pt notified home health and RN will change pump tomorrow. MD notified and ok'd pt remaining on home pump.

## 2012-11-17 NOTE — Progress Notes (Signed)
Patient admitted with chest pain, severe heart failure.  Patient is a full code.  Found patient awake and alert, ambulating in his room.  Patient denied pain and stated that he was feeling much better.  Reviewed chart and patient continues to receive telemetry monitoring and cardiac enzymes, which have been negative up to this point.  Patient receiving treatment for hypokalemia.  Continue current plan of care and anticipate discharge home with continued Hospice services.  April Costella Hatcher, RN, BSN Hospice

## 2012-11-18 DIAGNOSIS — R072 Precordial pain: Secondary | ICD-10-CM

## 2012-11-18 DIAGNOSIS — I5022 Chronic systolic (congestive) heart failure: Secondary | ICD-10-CM

## 2012-11-18 DIAGNOSIS — N182 Chronic kidney disease, stage 2 (mild): Secondary | ICD-10-CM

## 2012-11-18 DIAGNOSIS — I4891 Unspecified atrial fibrillation: Secondary | ICD-10-CM

## 2012-11-18 DIAGNOSIS — R079 Chest pain, unspecified: Secondary | ICD-10-CM

## 2012-11-18 LAB — COMPREHENSIVE METABOLIC PANEL
Albumin: 3.3 g/dL — ABNORMAL LOW (ref 3.5–5.2)
BUN: 65 mg/dL — ABNORMAL HIGH (ref 6–23)
Chloride: 93 mEq/L — ABNORMAL LOW (ref 96–112)
Creatinine, Ser: 3.19 mg/dL — ABNORMAL HIGH (ref 0.50–1.35)
GFR calc Af Amer: 22 mL/min — ABNORMAL LOW (ref >90)
Glucose, Bld: 129 mg/dL — ABNORMAL HIGH (ref 70–99)
Total Bilirubin: 0.4 mg/dL (ref 0.3–1.2)
Total Protein: 7.4 g/dL (ref 6.0–8.3)

## 2012-11-18 MED ORDER — GI COCKTAIL ~~LOC~~
30.0000 mL | Freq: Once | ORAL | Status: AC
  Administered 2012-11-18: 30 mL via ORAL
  Filled 2012-11-18: qty 30

## 2012-11-18 MED ORDER — POTASSIUM CHLORIDE CRYS ER 20 MEQ PO TBCR
80.0000 meq | EXTENDED_RELEASE_TABLET | Freq: Three times a day (TID) | ORAL | Status: DC
  Administered 2012-11-18 – 2012-11-19 (×4): 80 meq via ORAL
  Filled 2012-11-18 (×5): qty 4

## 2012-11-18 MED ORDER — ZOLPIDEM TARTRATE 5 MG PO TABS
5.0000 mg | ORAL_TABLET | Freq: Every evening | ORAL | Status: DC | PRN
  Administered 2012-11-18 – 2012-11-19 (×2): 5 mg via ORAL
  Filled 2012-11-18 (×2): qty 1

## 2012-11-18 MED ORDER — AMIODARONE HCL 200 MG PO TABS
400.0000 mg | ORAL_TABLET | Freq: Every day | ORAL | Status: DC
  Administered 2012-11-19 – 2012-11-20 (×2): 400 mg via ORAL
  Filled 2012-11-18 (×2): qty 2

## 2012-11-18 MED ORDER — POTASSIUM CHLORIDE CRYS ER 20 MEQ PO TBCR: 40.0000 meq | EXTENDED_RELEASE_TABLET | Freq: Once | ORAL | Status: DC

## 2012-11-18 MED ORDER — POLYETHYLENE GLYCOL 3350 17 G PO PACK
17.0000 g | PACK | Freq: Two times a day (BID) | ORAL | Status: AC
  Administered 2012-11-18 – 2012-11-19 (×3): 17 g via ORAL
  Filled 2012-11-18 (×4): qty 1

## 2012-11-18 NOTE — Progress Notes (Signed)
[facility] PROGRESS NOTE Interim History: 62 y.o.male is a 62 y.o. gentlemen with severe HF due to NICM (EF 15%) with multiple hospital admissions for HF exacerbations. He also has history of ventricular tachycardia s/p St Jude ICD by [name], chronic kidney disease: stage III, baseline Cr ~2, and paroxysmal atrial arrhythmias controlled s/p multiple cardioversion on amiodarone. As well as sarcoidosis, hypertension. Cath in 2008 by Dr. [name] showed no CAD. Dry weight is 219 (11/2011). Milrinone initiated in January for low output. Comes in for mid-sternal chest pain.  Filed Weights   11/16/12 2158 11/17/12 0648 11/18/12 0500  Weight: 107.502 kg (237 lb) 106.1 kg (233 lb 14.5 oz) 107.5 kg (236 lb 15.9 oz)        Intake/Output Summary (Last 24 hours) at 11/18/12 1041 Last data filed at 11/18/12 0636  Gross per 24 hour  Intake   1083 ml  Output   1625 ml  Net   -542 ml     Assessment/Plan: *Chest pain, mid sternal/ Atrial fibrillation: - He relates he felt palpitation then started having chest pain. - Cardiac enzymes cont to be negative.  - Potasium is low. Replete, agree with increasing, moniotr electrolytes. - Cont current dose of milrinone (on home dose infusion pump) and amiodarone. - Appreciate cardiology assistance. - what bothering him the most is the chest pain, if we control this chest pain, I think he will be able to manage it at home. Does not want to use narcotics excessively to manage pain.  Systolic CHF, acute on chronic/Cardiomyopathy, dilated, nonischemic: - Bp stable, hyponatremia resolved. - Cont diuretics, cannot appreciate JVD.  Hypokalemia: - Replete check a mag. - On aldactone.  CHRONIC KIDNEY DISEASE STAGE II (MILD) - at baseline Cr.  GASTROESOPHAGEAL REFLUX DISEASE - PPI, no pain on Physical exam. - Hbg stable, no melanotic stool or BRBPR.     Code Status: FULL CODE  Family Communication: No Family Present  Disposition Plan:  Inpatient    Consultants:  cardiology  Procedures: ECHO: none  Antibiotics:  none  HPI/Subjective: Having intermittent chest pain  Objective: Filed Vitals:   11/17/12 2038 11/18/12 0154 11/18/12 0500 11/18/12 0954  BP: 98/73 98/70 98/79  96/72  Pulse: 88 88 89 76  Temp: 97.9 F (36.6 C) 97.6 F (36.4 C) 97.7 F (36.5 C)   TempSrc: Oral Oral Oral   Resp: 18 18 18    Height:      Weight:   107.5 kg (236 lb 15.9 oz)   SpO2: 98% 99% 98%      Exam:  General: Alert, awake, oriented x3, in no acute distress.  HEENT: No bruits, no goiter. -JVD Heart: Regular rate and rhythm, without murmurs, rubs, gallops.  Lungs: Good air movement, bilateral air movement.  Abdomen: Soft, nontender, nondistended, positive bowel sounds.  Neuro: Grossly intact, nonfocal.   Data Reviewed: Basic Metabolic Panel:  Recent Labs Lab 11/16/12 2220 11/17/12 0800 11/18/12 0600  NA 134*  --  135  K 3.0*  --  3.2*  CL 97  --  93*  CO2 25  --  30  GLUCOSE 112*  --  129*  BUN 67*  --  65*  CREATININE 2.80*  --  3.19*  CALCIUM 10.2  --  9.8  MG  --  2.1  --    Liver Function Tests:  Recent Labs Lab 11/18/12 0600  AST 32  ALT 21  ALKPHOS 86  BILITOT 0.4  PROT 7.4  ALBUMIN 3.3*   No results  found for this basename: LIPASE, AMYLASE,  in the last 168 hours No results found for this basename: AMMONIA,  in the last 168 hours CBC:  Recent Labs Lab 11/16/12 2220  WBC 5.2  HGB 15.3  HCT 41.9  MCV 87.8  PLT 245   Cardiac Enzymes:  Recent Labs Lab 11/17/12 0800 11/17/12 1300 11/17/12 1815  TROPONINI <0.30 <0.30 <0.30   BNP (last 3 results)  Recent Labs  02/18/12 2210 09/19/12 0640 11/16/12 2220  PROBNP 3490.0* 596.8* 281.2*   CBG: No results found for this basename: GLUCAP,  in the last 168 hours  No results found for this or any previous visit (from the past 240 hour(s)).   Studies: Dg Chest Port 1 View  11/16/2012   CLINICAL DATA:  CHEST PAIN .  EXAM:  PORTABLE CHEST - 1 VIEW  COMPARISON:  DG CHEST 1V PORT dated 09/19/2012  FINDINGS: Left-sided pacemaker with continuous lesion overlies normal cardiac silhouette. Right central venous line is unchanged. No effusion, infiltrate, or pneumothorax. Anterior cervical fusion noted.  IMPRESSION: No acute cardiopulmonary process.   Electronically Signed   By: Genevive Bi M.D.   On: 11/16/2012 22:50    Scheduled Meds: . allopurinol  100 mg Oral Daily  . amiodarone  400 mg Oral BID  . apixaban  5 mg Oral BID  . carvedilol  3.125 mg Oral BID WC  . pantoprazole  40 mg Oral Daily  . potassium chloride SA  40 mEq Oral Once  . potassium chloride SA  80 mEq Oral TID WC  . sodium chloride  3 mL Intravenous Q12H  . spironolactone  12.5 mg Oral Daily  . torsemide  20 mg Oral QPM  . torsemide  40 mg Oral [id number]   Continuous Infusions: . milrinone       Marinda Elk  [facility] Pager [phone]. If 8PM-8AM, please contact night-coverage at [email or web address], password [facility] 11/18/2012, 10:41 AM  LOS: 2 days

## 2012-11-18 NOTE — Evaluation (Signed)
Physical Therapy Evaluation and Discharge Patient Details Name: [name] MRN: [id number] DOB: 07/06/1950 Today's Date: 11/18/2012 Time: 4098-1191 PT Time Calculation (min): 15 min  PT Assessment / Plan / Recommendation History of Present Illness  Pt admitted with SOB/CHF exacerbation. Pt with EF of 15%  Clinical Impression  Pt functioning at baseline. Activity tolerance remains limited due to cardiopulmonary status. Pt with no further skilled acute PT needs at this time. Pt to con't to benefit from cardiac rehab. Pt safe to d/c home once medically stable. Please reconsult if needed in future. Thanks! Acute PT signing off.    PT Assessment  Patent does not need any further PT services    Follow Up Recommendations  No PT follow up    Does the patient have the potential to tolerate intense rehabilitation      Barriers to Discharge        Equipment Recommendations  None recommended by PT    Recommendations for Other Services     Frequency      Precautions / Restrictions Precautions Precautions: None Restrictions Weight Bearing Restrictions: No   Pertinent Vitals/Pain Denies pain, Spo2 at 100% on 2Lo2 via Gail      Mobility  Bed Mobility Bed Mobility: Supine to Sit;Sit to Supine Supine to Sit: 7: Independent Sit to Supine: 7: Independent Transfers Transfers: Sit to Stand;Stand to Sit Sit to Stand: 7: Independent Stand to Sit: 7: Independent Details for Transfer Assistance: safe technique Ambulation/Gait Ambulation/Gait Assistance: 6: Modified independent (Device/Increase time) Ambulation Distance (Feet): 250 Feet Assistive device: None Ambulation/Gait Assistance Details: + SOB, pt with decreased pace. no episodes of LOB Gait Pattern: Step-through pattern Stairs: Yes Stairs Assistance: 6: Modified independent (Device/Increase time) Stair Management Technique: One rail Left;Alternating pattern Number of Stairs: 10    Exercises     PT Diagnosis:    PT  Problem List:   PT Treatment Interventions:       PT Goals(Current goals can be found in the care plan section) Acute Rehab PT Goals Patient Stated Goal: home PT Goal Formulation: No goals set, d/c therapy  Visit Information  Last PT Received On: 11/18/12 Assistance Needed: +1 History of Present Illness: Pt admitted with SOB/CHF exacerbation. Pt with EF of 15%       Prior Functioning  Home Living Family/patient expects to be discharged to:: Private residence Living Arrangements: Parent Available Help at Discharge: Family;Available 24 hours/day Type of Home: House Home Access: Stairs to enter [employer] of Steps: 3-6 Entrance Stairs-Rails: Can reach both Home Layout: One level Home Equipment: Shower seat Prior Function Level of Independence: Independent Communication Communication: No difficulties Dominant Hand: Right    Cognition  Cognition Arousal/Alertness: Awake/alert Behavior During Therapy: WFL for tasks assessed/performed Overall Cognitive Status: Within Functional Limits for tasks assessed    Extremity/Trunk Assessment Upper Extremity Assessment Upper Extremity Assessment: Overall WFL for tasks assessed Lower Extremity Assessment Lower Extremity Assessment: Overall WFL for tasks assessed Cervical / Trunk Assessment Cervical / Trunk Assessment: Normal   Balance    End of Session PT - End of Session Equipment Utilized During Treatment: Gait belt Activity Tolerance: Patient tolerated treatment well (limited by cardiopulmonary status) Patient left: in bed;with call bell/phone within reach Nurse Communication: Mobility status  GP     Marcene Brawn 11/18/2012, 4:02 PM  Lewis Shock, PT, DPT Pager #: [phone] Office #: [phone]

## 2012-11-18 NOTE — Progress Notes (Signed)
Patient ID: [name], male   DOB: 01/10/1950, 62 y.o.   MRN: [id number]   SUBJECTIVE:  Readmitted Saturday with chest pain and palpitations. He has been followed closely in the HF clinic with his last appointment 11/14/12. He ha been on home Milrionone and followed by [facility]. Due to prolonged QTc 656,  amiodarone, benadryl, and atarax stopped.    CEs negative. EKG SR 86 bpm QTc 592. Given Morphine and chest pain improved.  Amiodarone was restarted and increaed yesterday to 400 mg bid.  Weight up 3 pounds.   Denies SOB/Orthopnea. Says he slept ok after he had an ativan.   On tele multiple brief episodes of atrial tach with RVR     . allopurinol  100 mg Oral Daily  . amiodarone  400 mg Oral BID  . apixaban  5 mg Oral BID  . carvedilol  3.125 mg Oral BID WC  . pantoprazole  40 mg Oral Daily  . potassium chloride SA  80 mEq Oral BID  . sodium chloride  3 mL Intravenous Q12H  . spironolactone  12.5 mg Oral Daily  . torsemide  20 mg Oral QPM  . torsemide  40 mg Oral BH-q7a  milrinone gtt    Filed Vitals:   11/17/12 1523 11/17/12 2038 11/18/12 0154 11/18/12 0500  BP: 102/79 98/73 98/70  98/79  Pulse: 120 88 88 89  Temp:  97.9 F (36.6 C) 97.6 F (36.4 C) 97.7 F (36.5 C)  TempSrc:  Oral Oral Oral  Resp: 18 18 18 18   Height:      Weight:    236 lb 15.9 oz (107.5 kg)  SpO2: 100% 98% 99% 98%    Intake/Output Summary (Last 24 hours) at 11/18/12 0651 Last data filed at 11/18/12 0636  Gross per 24 hour  Intake   1323 ml  Output   1625 ml  Net   -302 ml    LABS: Basic Metabolic Panel:  Recent Labs  40/98/11 2220 11/17/12 0800  NA 134*  --   K 3.0*  --   CL 97  --   CO2 25  --   GLUCOSE 112*  --   BUN 67*  --   CREATININE 2.80*  --   CALCIUM 10.2  --   MG  --  2.1   Liver Function Tests: No results found for this basename: AST, ALT, ALKPHOS, BILITOT, PROT, ALBUMIN,  in the last 72 hours No results found for this basename: LIPASE, AMYLASE,   in the last 72 hours CBC:  Recent Labs  11/16/12 2220  WBC 5.2  HGB 15.3  HCT 41.9  MCV 87.8  PLT 245   Cardiac Enzymes:  Recent Labs  11/17/12 0800 11/17/12 1300 11/17/12 1815  TROPONINI <0.30 <0.30 <0.30   BNP: No components found with this basename: POCBNP,  D-Dimer: No results found for this basename: DDIMER,  in the last 72 hours Hemoglobin A1C: No results found for this basename: HGBA1C,  in the last 72 hours Fasting Lipid Panel: No results found for this basename: CHOL, HDL, LDLCALC, TRIG, CHOLHDL, LDLDIRECT,  in the last 72 hours Thyroid Function Tests: No results found for this basename: TSH, T4TOTAL, FREET3, T3FREE, THYROIDAB,  in the last 72 hours Anemia Panel: No results found for this basename: VITAMINB12, FOLATE, FERRITIN, TIBC, IRON, RETICCTPCT,  in the last 72 hours  RADIOLOGY: Dg Chest Port 1 View  11/16/2012   CLINICAL DATA:  CHEST PAIN .  EXAM: PORTABLE CHEST -  1 VIEW  COMPARISON:  DG CHEST 1V PORT dated 09/19/2012  FINDINGS: Left-sided pacemaker with continuous lesion overlies normal cardiac silhouette. Right central venous line is unchanged. No effusion, infiltrate, or pneumothorax. Anterior cervical fusion noted.  IMPRESSION: No acute cardiopulmonary process.   Electronically Signed   By: Genevive Bi M.D.   On: 11/16/2012 22:50    PHYSICAL EXAM General: NAD Neck: No JVD, no thyromegaly or thyroid nodule.  Lungs: Clear to auscultation bilaterally with normal respiratory effort. CV: Nondisplaced PMI.  Heart regular S1/S2, +S3, no murmur.  No peripheral edema.   Abdomen: Soft, nontender, no hepatosplenomegaly, ++ distention.  Neurologic: Alert and oriented x 3.  Psych: Normal affect. Extremities: No clubbing or cyanosis. 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  1. Chest pain/tachypalpitations: with history of .A Fib/FL  successful S/P DC-CV 04/27/12   - Continue  KCl to 80 meq  bid.  -Cut amiodarone back to amiodarone to 400 mg daily ---> QTc 696 - Continue Coreg to 3.125 mg bid.   2. Chronic systolic CHF: NYHA IIIB. On chronic  milrinone 0.375 mcg and home torsemide 40 mg  am and 20 mg in pm. Volume status ok. Supplement k. Renal function trending up. On 12.5 spironolactone due to ongoing hyporkalemia.   3. CKD, stage IV: Creatinine trending up.   4. Hypokalemia  5. Prolonged QT  CLEGG,AMY 11/18/2012 6:51 AM  Patient seen and examined with Tonye Becket, NP. We discussed all aspects of the encounter. I agree with the assessment and plan as stated above.   Remains a difficult situation. HF very advanced but relatively stable. From a symptomatic standpoint main issue currently seems to be frequent episodes of atypical flutter vs a tach. Was on amio but QT continues to prolong. (Qtc now 690). Will decrease amio to 400 daily and review options with EP. Suspect AFL.tach may be left sided so not readily ablatable and also has h/o PAF making ablation less attractive.   Daniel Bensimhon,MD 4:22 PM

## 2012-11-18 NOTE — Progress Notes (Signed)
[facility]  Patient Status:Patient active up to this admission    [facility] is providing the following services:   Pt receiving [facility] services for home inotropes.  Nursing provided by [facility]. [facility] hospital team will follow and support DC home when deemed appropriate.   If patient discharges after hours, please call [phone].   Sedalia Muta 11/18/2012, 9:54 AM

## 2012-11-18 NOTE — Progress Notes (Signed)
[facility] Social Work note  Patient was admitted over the weekend for chest pain and had several episodes of fluttering and pain during visit. He shared no desire for morphine to treat pain as he worries about addiction. He requests any other types of meds to treat. He also reports some anxiety and wants something to treat. Patient shared coping, stressors at home. He also shared goal to return there upon return. He shared goal to keep living as long as he can. He voiced that he is tired, yet not that tired that he wants to discontinue medications.  Support offered. Orson Gear, [place]   [phone]

## 2012-11-18 NOTE — Progress Notes (Signed)
Report given to receiving RN. Patient is stable. No verbal complaints and no signs or symptoms of distress or discomfort.  

## 2012-11-18 NOTE — Progress Notes (Signed)
Pt reclined in bed.  He reported having episodes of chest pain and A-Fib earlier today.  He wants to avoid using morphine if at all possible.  He has concerns about using morphine at home.  Pt is hopeful that medications can be adjusted to at least reduce the frequency of chest pain/ A-Fib.  Milrinone bag changed today.  29.7 ml left in old bag wasted.  New bag with 420 ml hung.  Rate remains at 2.4 ml/hr.    Reviewed chart.  This is a hospice related admission.  Please notify [facility] of any pt movements at [phone].  Elijah Birk RN, [facility] RN, [facility] [phone]

## 2012-11-18 NOTE — Progress Notes (Signed)
CARDIAC REHAB PHASE I   PRE:  Rate/Rhythm: 75 SR 1st degree HB  BP:  Supine:   Sitting: 96/59  Standing:    SaO2: 97 RA  MODE:  Ambulation: 460 ft   POST:  Rate/Rhythm: 85  BP:  Supine:   Sitting: 96/69  Standing:    SaO2: 98 RA 0810-0850 Assisted X 1 to ambulate. Gait steady. VS stable.Pt able to walk 460 feet with several standing rest stops.Pt to side of bed after walk. Pt states that he weighs himself daily and takes his medications. Admits that he does not follow a low sodium diet. Pt voices frustration with a fib. He states that he feels really bad when he is in it.   Melina Copa RN 11/18/2012 8:48 AM

## 2012-11-18 NOTE — Progress Notes (Signed)
Patient had a family member bring them [employer] and stated that , "They told me I could eat whatever makes me happy."

## 2012-11-18 NOTE — Progress Notes (Signed)
Patient's BP is 96/72. Patient is asymptomatic at the time with no verbal complaints and no signs or symptoms of distress or discomfort. Scheduled Amiodarone and spironolactone is due. PA notified. Ok to administer medication. Will continue to monitor patient for further changes in condition.

## 2012-11-19 ENCOUNTER — Other Ambulatory Visit: Payer: Self-pay

## 2012-11-19 ENCOUNTER — Inpatient Hospital Stay (HOSPITAL_COMMUNITY)

## 2012-11-19 DIAGNOSIS — N189 Chronic kidney disease, unspecified: Secondary | ICD-10-CM

## 2012-11-19 LAB — BASIC METABOLIC PANEL
CO2: 31 mEq/L (ref 19–32)
Calcium: 9.4 mg/dL (ref 8.4–10.5)
GFR calc Af Amer: 21 mL/min — ABNORMAL LOW (ref >90)
GFR calc non Af Amer: 18 mL/min — ABNORMAL LOW (ref >90)
Glucose, Bld: 117 mg/dL — ABNORMAL HIGH (ref 70–99)
Potassium: 3.6 mEq/L (ref 3.5–5.1)
Sodium: 136 mEq/L (ref 135–145)

## 2012-11-19 LAB — CARBOXYHEMOGLOBIN
Carboxyhemoglobin: 1.8 % — ABNORMAL HIGH (ref 0.5–1.5)
O2 Saturation: 66.6 %
Total hemoglobin: 13.4 g/dL — ABNORMAL LOW (ref 13.5–18.0)

## 2012-11-19 LAB — MRSA PCR SCREENING: MRSA by PCR: NEGATIVE

## 2012-11-19 LAB — CALCIUM, IONIZED: Calcium, Ion: 1.25 mmol/L (ref 1.13–1.30)

## 2012-11-19 MED ORDER — MILRINONE IN DEXTROSE 20 MG/100ML IV SOLN: 0.3750 ug/kg/min | INTRAVENOUS | Status: DC

## 2012-11-19 NOTE — Progress Notes (Signed)
An EKG was obtained and placed in the patient's chart.   

## 2012-11-19 NOTE — Progress Notes (Addendum)
Room  [facility] RN Visit-R.Emanuella Nickle RN  Related admission to [facility] diagnosis of CHF.   Pt is FULL code.    Pt alert & oriented, lying in bed, with no complaints at time of visit of pain or discomfort.    No family present.   Patient's home medication list is on shadow chart.   Pt being transferred to Cardiac Step down unit for CVP monitoring per staff RN Junie Panning.  Staff RN states pt does complain of chest pain often - allergic to Nitro so medication available is MS.  Staff RN reports that U/S of abdomen did not reveal any fluid for paracentesis.  Per staff RN, family bringing in "[employer]" for pt to eat - pt attempted to hide it from staff RN- weight up today.   Please call [facility] @ [phone]- ask for RN Liaison or after hours,ask for on-call RN with any hospice needs.   Thank you.  Joneen Boers, RN  [facility]  Hospice Liaison  [phone])

## 2012-11-19 NOTE — Care Management Note (Signed)
    Page 1 of 1   11/19/2012     3:47:35 PM   CARE MANAGEMENT NOTE 11/19/2012  Patient:  [name], [name]   Account Number:  [id number]  Date Initiated:  11/19/2012  Documentation initiated by:  Junius Creamer  Subjective/Objective Assessment:   adm w heart failure     Action/Plan:   lives w fam, act w [facility]   Anticipated DC Date:     Anticipated DC Plan:        DC Planning Services  CM consult      [facility] Choice  Resumption Of Svcs/PTA Provider   Choice offered to / List presented to:          [facility] arranged  HH-1 RN  HH-6 SOCIAL WORKER      HH agency  HOSPICE   Status of service:   Medicare Important Message given?   (If response is "NO", the following Medicare IM given date fields will be blank) Date Medicare IM given:   Date Additional Medicare IM given:    Discharge Disposition:  HOME W HOSPICE CARE  Per UR Regulation:  Reviewed for med. necessity/level of care/duration of stay  If discussed at Long Length of Stay Meetings, dates discussed:    Comments:  11-18 1546 debbie Darrell Meisenheimer rn,bsn have alerted rose w [facility] of pt's adm. will cont to follow.

## 2012-11-19 NOTE — Progress Notes (Signed)
Per pt he prefers his home health milrinone, Dr Gala Romney and pharm aware

## 2012-11-19 NOTE — Progress Notes (Signed)
Limited US of abdomen finds no ascites. Paracentesis not performed.   Brayton El PA-C Interventional Radiology 11/19/2012 11:14 AM

## 2012-11-19 NOTE — Progress Notes (Signed)
Co-signed for [employer] RN/BSN for assessments, IV assessments, medication administrations, I's & O's, vital signs, patient education, care plan, and progress notes. Ernesta Amble, RN/BSN

## 2012-11-19 NOTE — Progress Notes (Signed)
The patient complained of brief pain at 2220 stating it was from A. flutter just after receiving Ativan for anxiety.  By the time the morphine was pushed, he stated that it had subsided.  He stated that he was having some indigestion and requested a GI cocktail, which was given.  He was concerned about not having a bowel movement since Friday.  Scheduled Miralax was given to the patient.

## 2012-11-19 NOTE — Progress Notes (Signed)
Patient ID: [name], male   DOB: 1950/02/08, 62 y.o.   MRN: [id number]   SUBJECTIVE:  Readmitted Saturday with chest pain and palpitations. He has been followed closely in the HF clinic with his last appointment 11/14/12. He ha been on home Milrionone and followed by [facility]. Due to prolonged QTc 656,  amiodarone, benadryl, and atarax stopped.    CEs negative. EKG SR 86 bpm QTc 592. Given Morphine and chest pain improved.  Yesterday amiodarone cut back to 400 mg daily due to long QT-c 696. Had bojangels last night.  Weight up 5 pounds.   Denies SOB/Orthopnea.       Marland Kitchen allopurinol  100 mg Oral Daily  . amiodarone  400 mg Oral Daily  . apixaban  5 mg Oral BID  . carvedilol  3.125 mg Oral BID WC  . pantoprazole  40 mg Oral Daily  . polyethylene glycol  17 g Oral BID  . potassium chloride SA  40 mEq Oral Once  . potassium chloride SA  80 mEq Oral TID WC  . sodium chloride  3 mL Intravenous Q12H  . spironolactone  12.5 mg Oral Daily  . torsemide  20 mg Oral QPM  . torsemide  40 mg Oral BH-q7a  milrinone gtt    Filed Vitals:   11/18/12 1443 11/18/12 1702 11/18/12 1954 11/19/12 0551  BP: 98/67 98/79 105/71 103/84  Pulse: 86 92 74 77  Temp: 97.7 F (36.5 C)  97.7 F (36.5 C) 97.6 F (36.4 C)  TempSrc: Oral  Oral Oral  Resp: 20  18 19   Height:      Weight:    241 lb (109.317 kg)  SpO2: 97%  98% 97%    Intake/Output Summary (Last 24 hours) at 11/19/12 0851 Last data filed at 11/19/12 4098  Gross per 24 hour  Intake   1623 ml  Output   1575 ml  Net     48 ml    LABS: Basic Metabolic Panel:  Recent Labs  11/91/47 0800 11/18/12 0600 11/19/12 0601  NA  --  135 136  K  --  3.2* 3.6  CL  --  93* 96  CO2  --  30 31  GLUCOSE  --  129* 117*  BUN  --  65* 65*  CREATININE  --  3.19* 3.43*  CALCIUM  --  9.8 9.4  MG 2.1  --   --    Liver Function Tests:  Recent Labs  11/18/12 0600  AST 32  ALT 21  ALKPHOS 86  BILITOT 0.4  PROT 7.4  ALBUMIN  3.3*   No results found for this basename: LIPASE, AMYLASE,  in the last 72 hours CBC:  Recent Labs  11/16/12 2220  WBC 5.2  HGB 15.3  HCT 41.9  MCV 87.8  PLT 245   Cardiac Enzymes:  Recent Labs  11/17/12 0800 11/17/12 1300 11/17/12 1815  TROPONINI <0.30 <0.30 <0.30   BNP: No components found with this basename: POCBNP,  D-Dimer: No results found for this basename: DDIMER,  in the last 72 hours Hemoglobin A1C: No results found for this basename: HGBA1C,  in the last 72 hours Fasting Lipid Panel: No results found for this basename: CHOL, HDL, LDLCALC, TRIG, CHOLHDL, LDLDIRECT,  in the last 72 hours Thyroid Function Tests: No results found for this basename: TSH, T4TOTAL, FREET3, T3FREE, THYROIDAB,  in the last 72 hours Anemia Panel: No results found for this basename: VITAMINB12, FOLATE, FERRITIN, TIBC, IRON,  RETICCTPCT,  in the last 72 hours  RADIOLOGY: Dg Chest Port 1 View  11/16/2012   CLINICAL DATA:  CHEST PAIN .  EXAM: PORTABLE CHEST - 1 VIEW  COMPARISON:  DG CHEST 1V PORT dated 09/19/2012  FINDINGS: Left-sided pacemaker with continuous lesion overlies normal cardiac silhouette. Right central venous line is unchanged. No effusion, infiltrate, or pneumothorax. Anterior cervical fusion noted.  IMPRESSION: No acute cardiopulmonary process.   Electronically Signed   By: Genevive Bi M.D.   On: 11/16/2012 22:50    PHYSICAL EXAM General: NAD Resting in bed.  Neck: No JVD, no thyromegaly or thyroid nodule.  Lungs: Clear to auscultation bilaterally with normal respiratory effort. CV: Nondisplaced PMI.  Heart regular S1/S2, +S3, no murmur.  No peripheral edema.   Abdomen: Soft, nontender, no hepatosplenomegaly, +++ distention.  Neurologic: Alert and oriented x 3.  Psych: Normal affect. Extremities: No clubbing or cyanosis. 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  1. Chest pain/tachypalpitations: with history of .A Fib/FL  successful S/P DC-CV 04/27/12  Rate controlled. - Continue  KCl to 80 meq  bid.  -Continue  amiodarone  400 mg daily ---> QTc down 520 - Continue Coreg to 3.125 mg bid.  - On eliquis 5 mg twice a day  2. Chronic systolic CHF: NYHA IIIB. On chronic  milrinone 0.375 mcg. Weight up 5 pounds. Renal function trending up. On 12.5 spironolactone due to ongoing hypokalemia.   3. CKD, stage IV: Creatinine trending up. 3.1>3.4  4. Hypokalemia> resolved  5. Prolonged QT. Amiodarone cut back to 400 mg daily   6. Ascites- Get Paracentesis today.   CLEGG,AMY NP-C 11/19/2012 8:51 AM  Patient seen and examined with Tonye Becket, NP. We discussed all aspects of the encounter. I agree with the assessment and plan as stated above.   Difficult situation. Weight is up but renal function is worse. Hard to assess volume status on exam. Ab u/s without ascites. Will transfer to SDU to measure CVP and co-ox to get a better handle on his hemodynamic status.   Is in and out of AF/A tach. amio cut back due to long QT. Will review ECGs with EP but doubt there is anything to ablate.   Akaiya Touchette,MD 12:16 PM

## 2012-11-19 NOTE — Progress Notes (Signed)
[facility])  Hospice home care Chaplain note  Patient (pt): [name]  Room: [phone]  Hospice homecare chaplain visited to assess for spiritual needs.  Pt about to go for imaging, but indicated he was doing a little better.  He reported he continues to have chest pain.  Pt reporting feelings of anxiety and worry, but is leaning into his strong faith for coping.  Pt said he was hopeful that the next time chaplain saw him he would be back home.  Pt desired prayer for testing being done.  Chaplain provided active listening, normalizing feelings and affirming faith.  Chaplain provided pastoral presence, counsel, and prayer with blessing.  Chaplain will continue to provide ongoing spiritual support and will follow.  Beverly Isley-Landreth ThM, BCCC [facility] Clinical Chaplain

## 2012-11-19 NOTE — Progress Notes (Signed)
[id number] Cardiac Rehab Pt states that he has walked twice today. He states that his " legs feel like rubber now and he does not feel like walking now." We will follow pt tomorrow.  Beatrix Fetters 2:41 PM 11/19/2012

## 2012-11-19 NOTE — Progress Notes (Signed)
Report given to receiving RN. Medications and personal belongings sent to unit with patient. Patient in stable condition with no verbal complaints and no signs or symptoms of distress or discomfort.

## 2012-11-19 NOTE — Progress Notes (Signed)
[facility] PROGRESS NOTE Interim History: 62 y.o.male is a 62 y.o. gentlemen with severe HF due to NICM (EF 15%) with multiple hospital admissions for HF exacerbations. He also has history of ventricular tachycardia s/p [employer] Jude ICD by [name], chronic kidney disease: stage III, baseline Cr ~2, and paroxysmal atrial arrhythmias controlled s/p multiple cardioversion on amiodarone. As well as sarcoidosis, hypertension. Cath in 2008 by Dr. [name] showed no CAD. Dry weight is 219 (11/2011). Milrinone initiated in January for low output. Comes in for mid-sternal chest pain.  Filed Weights   11/17/12 0648 11/18/12 0500 11/19/12 0551  Weight: 106.1 kg (233 lb 14.5 oz) 107.5 kg (236 lb 15.9 oz) 109.317 kg (241 lb)        Intake/Output Summary (Last 24 hours) at 11/19/12 0454 Last data filed at 11/19/12 0981  Gross per 24 hour  Intake   1263 ml  Output   1175 ml  Net     88 ml     Assessment/Plan: *Chest pain, mid sternal/ Atrial fibrillation: - He relates he felt palpitation then started having chest pain. - electrolytes within normal. - Cont current dose of milrinone (on home dose infusion pump) and amiodarone. - Appreciate cardiology assistance. - what bothering him the most is the chest pain, if we control this chest pain, I think he will be able to manage it at home. Does not want to use narcotics excessively to manage pain.  Systolic CHF, acute on chronic/Cardiomyopathy, dilated, nonischemic: - Bp stable, hyponatremia resolved. - Cont diuretics, cannot appreciate JVD. - Significant abd distention. Abdominal US - Paracentesis today avoid > 5.0 L use albumin  Hypokalemia: - Replete check a mag. - On aldactone.  CHRONIC KIDNEY DISEASE STAGE II (MILD) - Cr trending up 2.8->3.1->3.4.  GASTROESOPHAGEAL REFLUX DISEASE - PPI, no pain on Physical exam. - Hbg stable, no melanotic stool or BRBPR.     Code Status: FULL CODE  Family Communication: No Family Present   Disposition Plan: Inpatient    Consultants:  cardiology  Procedures: ECHO: none  Antibiotics:  none  HPI/Subjective: Having intermittent chest pain  Objective: Filed Vitals:   11/18/12 1443 11/18/12 1702 11/18/12 1954 11/19/12 0551  BP: 98/67 98/79 105/71 103/84  Pulse: 86 92 74 77  Temp: 97.7 F (36.5 C)  97.7 F (36.5 C) 97.6 F (36.4 C)  TempSrc: Oral  Oral Oral  Resp: 20  18 19   Height:      Weight:    109.317 kg (241 lb)  SpO2: 97%  98% 97%     Exam:  General: Alert, awake, oriented x3, in no acute distress.  HEENT: No bruits, no goiter. -JVD Heart: Regular rate and rhythm, without murmurs, rubs, gallops.  Lungs: Good air movement, bilateral air movement.  Abdomen: Soft, nontender, nondistended, positive bowel sounds.  Neuro: Grossly intact, nonfocal.   Data Reviewed: Basic Metabolic Panel:  Recent Labs Lab 11/16/12 2220 11/17/12 0800 11/18/12 0600 11/19/12 0601  NA 134*  --  135 136  K 3.0*  --  3.2* 3.6  CL 97  --  93* 96  CO2 25  --  30 31  GLUCOSE 112*  --  129* 117*  BUN 67*  --  65* 65*  CREATININE 2.80*  --  3.19* 3.43*  CALCIUM 10.2  --  9.8 9.4  MG  --  2.1  --   --    Liver Function Tests:  Recent Labs Lab 11/18/12 0600  AST 32  ALT 21  ALKPHOS 86  BILITOT 0.4  PROT 7.4  ALBUMIN 3.3*   No results found for this basename: LIPASE, AMYLASE,  in the last 168 hours No results found for this basename: AMMONIA,  in the last 168 hours CBC:  Recent Labs Lab 11/16/12 2220  WBC 5.2  HGB 15.3  HCT 41.9  MCV 87.8  PLT 245   Cardiac Enzymes:  Recent Labs Lab 11/17/12 0800 11/17/12 1300 11/17/12 1815  TROPONINI <0.30 <0.30 <0.30   BNP (last 3 results)  Recent Labs  02/18/12 2210 09/19/12 0640 11/16/12 2220  PROBNP 3490.0* 596.8* 281.2*   CBG: No results found for this basename: GLUCAP,  in the last 168 hours  No results found for this or any previous visit (from the past 240 hour(s)).   Studies: No  results found.  Scheduled Meds: . allopurinol  100 mg Oral Daily  . amiodarone  400 mg Oral Daily  . apixaban  5 mg Oral BID  . carvedilol  3.125 mg Oral BID WC  . pantoprazole  40 mg Oral Daily  . polyethylene glycol  17 g Oral BID  . potassium chloride SA  80 mEq Oral TID WC  . sodium chloride  3 mL Intravenous Q12H  . spironolactone  12.5 mg Oral Daily  . torsemide  20 mg Oral QPM  . torsemide  40 mg Oral BH-q7a   Continuous Infusions: . milrinone 0.375 mcg/kg/min (11/18/12 1309)     Marinda Elk  [facility] Pager [phone]. If 8PM-8AM, please contact night-coverage at [email or web address], password [facility] 11/19/2012, 9:56 AM  LOS: 3 days

## 2012-11-20 LAB — BASIC METABOLIC PANEL
BUN: 60 mg/dL — ABNORMAL HIGH (ref 6–23)
CO2: 30 mEq/L (ref 19–32)
Calcium: 9.8 mg/dL (ref 8.4–10.5)
Chloride: 98 mEq/L (ref 96–112)
Creatinine, Ser: 3.02 mg/dL — ABNORMAL HIGH (ref 0.50–1.35)
GFR calc Af Amer: 24 mL/min — ABNORMAL LOW (ref >90)
GFR calc non Af Amer: 21 mL/min — ABNORMAL LOW (ref >90)
Glucose, Bld: 127 mg/dL — ABNORMAL HIGH (ref 70–99)
Potassium: 4.5 mEq/L (ref 3.5–5.1)

## 2012-11-20 LAB — CARBOXYHEMOGLOBIN: O2 Saturation: 63.7 %

## 2012-11-20 MED ORDER — LORAZEPAM 0.5 MG PO TABS: 0.5000 mg | ORAL_TABLET | Freq: Two times a day (BID) | ORAL | Status: DC | PRN

## 2012-11-20 MED ORDER — OXYCODONE-ACETAMINOPHEN 5-325 MG PO TABS: 1.0000 | ORAL_TABLET | ORAL | Status: DC | PRN

## 2012-11-20 MED ORDER — OXYCODONE-ACETAMINOPHEN 5-325 MG PO TABS
1.0000 | ORAL_TABLET | ORAL | Status: DC | PRN
  Administered 2012-11-20: 2 via ORAL
  Filled 2012-11-20: qty 2

## 2012-11-20 MED ORDER — TORSEMIDE 20 MG PO TABS: 20.0000 mg | ORAL_TABLET | Freq: Every day | ORAL | Status: DC

## 2012-11-20 MED ORDER — POTASSIUM CHLORIDE CRYS ER 20 MEQ PO TBCR: 40.0000 meq | EXTENDED_RELEASE_TABLET | Freq: Every day | ORAL | Status: DC

## 2012-11-20 MED ORDER — CARVEDILOL 3.125 MG PO TABS
3.1250 mg | ORAL_TABLET | Freq: Two times a day (BID) | ORAL | Status: DC
  Administered 2012-11-20: 3.125 mg via ORAL
  Filled 2012-11-20 (×3): qty 1

## 2012-11-20 NOTE — Progress Notes (Signed)
CARDIAC REHAB PHASE I   PRE:  Rate/Rhythm: 66 SR    BP: sitting 112/74    SaO2: 95 RA  MODE:  Ambulation: 391ft   POST:  Rate/Rhythm: 85 SR    BP: sitting 104/76     SaO2: 96 RA  Pt tolerated fairly well during walk but at end c/o severely weak legs and plopped onto bed. Then c/o CP coming on. This CP was still present when I left after 15 min. Sts he is used to it. No Afib. Discussed low sodium choices v/s high sodium. Discussed fried chicken from a restaurant and pickles. Pt was surprised at the amount of sodium present. Did not know 2000 mg was his max. Gave handout that lists sodium content of many foods. Pt liked the handout and seemed to be comprehending. Voiced understanding of daily wts and when to call. [phone]   Elissa Lovett [place] CES, ACSM 11/20/2012 11:56 AM

## 2012-11-20 NOTE — Clinical Documentation Improvement (Signed)
Conflicting Information documented regarding the Acuity of the patient's Systolic Heart Failure   - Acute on Chronic Systolic Heart Failure documented in H&P and Hospitalist Progress Notes   - Chronic systolic HF: Nonischemic cardiomyopathy with EF 15% (03/2012) likely exacerbation a nature of the progressive End stage CMP and flutter .  - Continue milrinone 0.375 mcg through [facility] cath  - Remain on coreg 3.125 mg BID and Spiro 12.5 mg daily (for severe hypokalemia)  - No ACEI with CKD  Documented in Cards Consult by Dr. Don Broach 11/17/12 at 2:10 am   - Chronic systolic CHF: Continue milrinone and home torsemide. He does not appear significantly volume                        overloaded documented in Cardiology Progress Notes 11/17/12  - No IV diuretics given this admission   - CXR on Admission: Left-sided pacemaker with continuous lesion overlies normal cardiac silhouette. Right central venous line is unchanged. No effusion, infiltrate, or pneumothorax. Anterior cervical fusion noted.  IMPRESSION:  No acute cardiopulmonary process.  Electronically Signed By: Genevive Bi M.D. On: 11/16/2012 22:50   Please clarify and document the ACUITY and TYPE of Heart Failure monitored and treated this admission:   - Acute on Chronic Systolic Heart Failure   - Chronic Systolic Heart Failure   - Other Acuity and Type of Heart Failure   - Other Condition   - Unable to Clinically Determine   Thank You,  [name] ,RN BSN CCDS Certified Clinical Documentation Specialist [phone] [facility]- Health Information Management

## 2012-11-20 NOTE — Progress Notes (Addendum)
Room [facility] 2H27 - [name] - [facility] RN Visit-R.Beckie Viscardi RN  Related admission to [facility] diagnosis of CHF.   Pt is FULL code.    Pt alert & oriented, sitting up off side of bed eating lunch of fruit plate.  Pt complains of a couple episodes of chest pain today.  No family present. Patient's home medication list is on shadow chart.   Pt states he does not want to be on MS at home - states it does not help his chest pain at home.  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  Advised pt his cardiology team needs to determine pain meds.   Pt continues on milrinone pump which is monitored by [facility] RN staff and supplied by [facility].    Please call [facility] @ [phone]- ask for RN Liaison or after hours,ask for on-call RN with any hospice needs.   Thank you.  Joneen Boers, RN  [facility]  Hospice Liaison  [phone])

## 2012-11-20 NOTE — Discharge Summary (Addendum)
DISCHARGE SUMMARY  [name]  MR#: [id number]  DOB:07/17/1950  Date of Admission: 11/16/2012 Date of Discharge: 11/20/2012  Attending Physician:Arling Cerone T  Patient's WUJ:WJXBJY Bensimhon, MD  Consults: Laurey Morale, MD - CHF Team  [facility]  Disposition: D/C home   Follow-up Appts:     Follow-up Information   Follow up with [facility] On 11/26/2012. (1:40 PM)    Specialty:  Cardiology   Contact information:   [address] [place] [address] [phone]     Tests Needing Follow-up: BMET to be arranged for 11/25/12 by CHF Team   Discharge Diagnoses: Chest pain/tachypalpitations Chronic systolic CHF: NYHA IIIB CKD, stage IV  Hypokalemia> resolved  Prolonged QT Ascites  Initial presentation: 62 y.o. gentlemen with severe HF due to NICM (EF 15%) with multiple hospital admissions for HF exacerbations. He also has history of ventricular tachycardia s/p [employer] ICD by [name], chronic kidney disease: stage III baseline Cr ~2, and paroxysmal atrial arrhythmias controlled s/p multiple cardioversions on amiodarone, as well as sarcoidosis, and hypertension. Cath in 2008 by Dr. [name] showed no CAD. Dry weight is 219 (11/2011). Milrinone initiated in January for low output. The pt was re-admitted on 11/16/12 for mid-sternal chest pain.  Hospital Course: (as per CHF Team, who attended to care)  Chest pain/tachypalpitations:  -with history of .A Fib/FL successful S/P DC-CV 04/27/12 Fewer palpitations. Rate controlled 60-70s  - Potassium stopped because diuretics stopped.  - Continue amiodarone 400 mg daily ---> QTc down 520  - Continue Coreg to 3.125 mg bid.  - On eliquis 5 mg twice a day   Chronic systolic CHF NYHA IIIB. Volume status stable. CVP 4. Continue to hold Torsemide due to low volume. Would restart torsemide 20 mg daily 11/20 with 40 meq K daily. On chronic milrinone 0.375 mcg. Renal function ok.  Creatinine baseline 3.0-3.2. On 12.5 spironolactone due to ongoing hypokalemia.   CKD, stage IV Creatinine trending down.3.1>3.4>3.0   Hypokalemia > resolved   Prolonged QT EKG 11/18 Qtc 520. Continue Amiodarone 400 mg daily   Ascites Evaluated by IR 11/18 no significant ascites fluid.     Medication List    STOP taking these medications       metolazone 2.5 MG tablet  Commonly known as:  ZAROXOLYN      TAKE these medications       allopurinol 100 MG tablet  Commonly known as:  ZYLOPRIM  Take 1 tablet (100 mg total) by mouth daily.     amiodarone 200 MG tablet  Commonly known as:  PACERONE  Take 200 mg by mouth 2 (two) times daily.     apixaban 5 MG Tabs tablet  Commonly known as:  ELIQUIS  Take 1 tablet (5 mg total) by mouth 2 (two) times daily.     carvedilol 3.125 MG tablet  Commonly known as:  COREG  Take 3.125 mg by mouth 2 (two) times daily with a meal.     esomeprazole 40 MG capsule  Commonly known as:  NEXIUM  Take 40 mg by mouth daily as needed (for acid reflux).     fluticasone 50 MCG/ACT nasal spray  Commonly known as:  FLONASE  Place 2 sprays into both nostrils daily as needed for allergies.     LORazepam 0.5 MG tablet  Commonly known as:  ATIVAN  Take 1-2 tablets (0.5-1 mg total) by mouth 2 (two) times daily as needed for anxiety.  milrinone 20 MG/100ML Soln infusion  Commonly known as:  PRIMACOR  Inject 37.3125 mcg/min into the vein continuous.     oxyCODONE-acetaminophen 5-325 MG per tablet  Commonly known as:  PERCOCET/ROXICET  Take 1-2 tablets by mouth every 4 (four) hours as needed for severe pain.     potassium chloride SA 20 MEQ tablet  Commonly known as:  K-DUR,KLOR-CON  Take 2 tablets (40 mEq total) by mouth daily.  Start taking on:  11/21/2012     sildenafil 100 MG tablet  Commonly known as:  VIAGRA  Take 1 tablet (100 mg total) by mouth daily as needed for erectile dysfunction.     spironolactone 25 MG tablet  Commonly  known as:  ALDACTONE  Take 0.5 tablets (12.5 mg total) by mouth daily.     torsemide 20 MG tablet  Commonly known as:  DEMADEX  Take 1 tablet (20 mg total) by mouth daily.  Start taking on:  11/21/2012       Day of Discharge BP 108/70  Pulse 64  Temp(Src) 98.5 F (36.9 C) (Oral)  Resp 17  Ht 6\' 3"  (1.905 m)  Wt 109.8 kg (242 lb 1 oz)  BMI 30.26 kg/m2  SpO2 99%  Physical Exam: General: No acute respiratory distress at rest Lungs: Clear to auscultation bilaterally without wheezes or crackles Cardiovascular: Regular rate and rhythm without murmur gallop or rub normal S1 and S2 Abdomen: Nontender, nondistended, soft, bowel sounds positive, no rebound, no ascites, no appreciable mass Extremities: No significant cyanosis, clubbing, or edema bilateral lower extremities  Results for orders placed during the hospital encounter of 11/16/12 (from the past 24 hour(s))  BASIC METABOLIC PANEL     Status: Abnormal   Collection Time    11/20/12  5:00 AM      Result Value Range   Sodium 135  135 - 145 mEq/L   Potassium 4.5  3.5 - 5.1 mEq/L   Chloride 98  96 - 112 mEq/L   CO2 30  19 - 32 mEq/L   Glucose, Bld 127 (*) 70 - 99 mg/dL   BUN 60 (*) 6 - 23 mg/dL   Creatinine, Ser 4.09 (*) 0.50 - 1.35 mg/dL   Calcium 9.8  8.4 - 81.1 mg/dL   GFR calc non Af Amer 21 (*) >90 mL/min   GFR calc Af Amer 24 (*) >90 mL/min  CARBOXYHEMOGLOBIN     Status: Abnormal   Collection Time    11/20/12  5:24 AM      Result Value Range   Total hemoglobin 13.8  13.5 - 18.0 g/dL   O2 Saturation 91.4     Carboxyhemoglobin 1.6 (*) 0.5 - 1.5 %   Methemoglobin 1.5  0.0 - 1.5 %    Time spent in discharge (includes decision making & examination of pt): >30 minutes  11/20/2012, 3:30 PM   Lonia Blood, MD [facility] Office  [phone] Pager [phone]  On-Call/Text Page:      [email or web address]      password [facility]

## 2012-11-20 NOTE — Progress Notes (Signed)
Patient ID: [name], male   DOB: 1950-02-19, 62 y.o.   MRN: [id number]   SUBJECTIVE:  Readmitted Saturday with chest pain and palpitations. He has been followed closely in the HF clinic with his last appointment 11/14/12. He ha been on home Milrionone and followed by [facility]. Due to prolonged QTc 656,  amiodarone, benadryl, and atarax stopped.    CEs negative. EKG SR 86 bpm QTc 592. Given Morphine and chest pain improved.    Amiodarone cut back to 400 mg daily due to long QT-c 696. Yesterday he was transferred to SDU for CVPs. Sent for paracentesis but abdominal U/S without ascites. CVP 4   Denies SOB/PND/Orthopnea. Wants to go home.      Marland Kitchen allopurinol  100 mg Oral Daily  . amiodarone  400 mg Oral Daily  . apixaban  5 mg Oral BID  . carvedilol  3.125 mg Oral BID WC  . pantoprazole  40 mg Oral Daily  . polyethylene glycol  17 g Oral BID  . sodium chloride  3 mL Intravenous Q12H  . spironolactone  12.5 mg Oral Daily  milrinone gtt    Filed Vitals:   11/19/12 2346 11/19/12 2357 11/20/12 0446 11/20/12 0500  BP: 115/84  109/80   Pulse:      Temp:  98.5 F (36.9 C) 98.7 F (37.1 C)   TempSrc:  Oral Oral   Resp:   16   Height:      Weight:    242 lb 1 oz (109.8 kg)  SpO2:  93% 98%     Intake/Output Summary (Last 24 hours) at 11/20/12 0617 Last data filed at 11/20/12 0500  Gross per 24 hour  Intake    960 ml  Output   2100 ml  Net  -1140 ml    LABS: Basic Metabolic Panel:  Recent Labs  40/98/11 0800  11/19/12 0601 11/20/12 0500  NA  --   < > 136 135  K  --   < > 3.6 4.5  CL  --   < > 96 98  CO2  --   < > 31 30  GLUCOSE  --   < > 117* 127*  BUN  --   < > 65* 60*  CREATININE  --   < > 3.43* 3.02*  CALCIUM  --   < > 9.4 9.8  MG 2.1  --   --   --   < > = values in this interval not displayed. Liver Function Tests:  Recent Labs  11/18/12 0600  AST 32  ALT 21  ALKPHOS 86  BILITOT 0.4  PROT 7.4  ALBUMIN 3.3*   No results found for  this basename: LIPASE, AMYLASE,  in the last 72 hours CBC: No results found for this basename: WBC, NEUTROABS, HGB, HCT, MCV, PLT,  in the last 72 hours Cardiac Enzymes:  Recent Labs  11/17/12 0800 11/17/12 1300 11/17/12 1815  TROPONINI <0.30 <0.30 <0.30   BNP: No components found with this basename: POCBNP,  D-Dimer: No results found for this basename: DDIMER,  in the last 72 hours Hemoglobin A1C: No results found for this basename: HGBA1C,  in the last 72 hours Fasting Lipid Panel: No results found for this basename: CHOL, HDL, LDLCALC, TRIG, CHOLHDL, LDLDIRECT,  in the last 72 hours Thyroid Function Tests: No results found for this basename: TSH, T4TOTAL, FREET3, T3FREE, THYROIDAB,  in the last 72 hours Anemia Panel: No results found for this basename: VITAMINB12,  FOLATE, FERRITIN, TIBC, IRON, RETICCTPCT,  in the last 72 hours  RADIOLOGY: Dg Chest Port 1 View  11/16/2012   CLINICAL DATA:  CHEST PAIN .  EXAM: PORTABLE CHEST - 1 VIEW  COMPARISON:  DG CHEST 1V PORT dated 09/19/2012  FINDINGS: Left-sided pacemaker with continuous lesion overlies normal cardiac silhouette. Right central venous line is unchanged. No effusion, infiltrate, or pneumothorax. Anterior cervical fusion noted.  IMPRESSION: No acute cardiopulmonary process.   Electronically Signed   By: Genevive Bi M.D.   On: 11/16/2012 22:50    PHYSICAL EXAM- CVP 4 General: NAD Resting in bed.  Neck: No JVD, no thyromegaly or thyroid nodule.  Lungs: Clear to auscultation bilaterally with normal respiratory effort. CV: Nondisplaced PMI.  Heart regular S1/S2, +S3, no murmur.  No peripheral edema.   Abdomen: obese Soft, nontender, no hepatosplenomegaly,  distention.  Neurologic: Alert and oriented x 3.  Psych: Normal affect. Extremities: No clubbing or cyanosis. RUE PICC  TELEMETRY: SR 60s      ASSESSMENT AND PLAN: 62 yo with history of nonischemic CMP on home milrinone and home hospice, paroxysmal atrial  flutter/a tach and CKD presented with chest pain associated with tachypalpitations.  1. Chest pain/tachypalpitations: with history of .A Fib/FL  successful S/P DC-CV 04/27/12 Fewer palpitations.  Rate controlled 60-70s - Potassium stopped because diuretics stopped.  -Continue  amiodarone  400 mg daily ---> QTc down 520 - Continue Coreg to 3.125 mg bid.  - On eliquis 5 mg twice a day  2. Chronic systolic CHF: NYHA IIIB. Volume status stable. CVP 4. Continue to hold Torsemide due to low volume. Would restart torsemide 20 mg daily tomorrow with 40 meq K daily.   On chronic  milrinone 0.375 mcg. Renal function ok. Creatinine baseline 3.0-3.2.  On 12.5 spironolactone due to ongoing hypokalemia.   3. CKD, stage IV: Creatinine trending down.3.1>3.4>3.0  4. Hypokalemia> resolved  5. Prolonged QT. EKG 11/18 Qtc 520. Continue Amiodarone 400 mg daily   6. Ascites. Evaluated by IR 11/18 no significant ascites fluid.   Should be ok to go home today with [facility] to follow for home Milrinone.   CLEGG,AMY NP-C 11/20/2012 6:17 AM  Patient seen with NP, agree with the above note.  Palpitations and chest pain have resolved.  CVP 4 today, agree with holding torsemide and restarting tomorrow at 20 mg daily.  If we can keep his K up, he may be less likely to go into atrial flutter.  He is now on a lower dose of amiodarone now as well due to prolonged QT (improved on 400 daily versus bid).   - Restart torsemide 20 mg daily + KCl 40 mEq daily tomorrow (was on 40 bid torsemide and 60 bid KCl at admission with hypokalemia).   - Will need closely followup, would get BMET on Monday and office visit next week as well.   Marca Ancona 11/20/2012 7:46 AM

## 2012-11-21 ENCOUNTER — Encounter (HOSPITAL_COMMUNITY): Payer: Self-pay | Admitting: Internal Medicine

## 2012-11-21 ENCOUNTER — Ambulatory Visit (HOSPITAL_COMMUNITY)
Admission: RE | Admit: 2012-11-21 | Discharge: 2012-11-21 | Disposition: A | Discharge status: Home / Self Care | Source: Ambulatory Visit | Attending: Internal Medicine | Admitting: Internal Medicine

## 2012-11-21 DIAGNOSIS — I4460 Unspecified fascicular block: Secondary | ICD-10-CM | POA: Insufficient documentation

## 2012-11-21 DIAGNOSIS — I440 Atrioventricular block, first degree: Secondary | ICD-10-CM | POA: Insufficient documentation

## 2012-11-21 DIAGNOSIS — R9431 Abnormal electrocardiogram [ECG] [EKG]: Secondary | ICD-10-CM | POA: Insufficient documentation

## 2012-11-21 DIAGNOSIS — I4892 Unspecified atrial flutter: Secondary | ICD-10-CM | POA: Insufficient documentation

## 2012-11-21 NOTE — Progress Notes (Signed)
Pt in for repeat EKG to check QTC, which is 557 per Dr Gala Romney this is ok, continue Amio but remain off of Benadryl and Atarax, pt aware and agreeable, follow up appt scheduled for 12/2

## 2012-11-23 ENCOUNTER — Telehealth: Payer: Self-pay | Admitting: Physician Assistant

## 2012-11-23 NOTE — Telephone Encounter (Signed)
Received call from [place] with [facility] re: the patient experiencing a 6 lbs weight gain since 11/19 discharge. He has stage D HF-- on home milrinone. He had previously been on metolazone 2.5mg  PRN and torsemide 40mg  qAM, 20mg  qPM. Metolazone discontinued and torsemide reduced to 20mg  daily on discharge due to low volume (CVP 4). Spironolactone continued for hypokalemia. Since discharge, his weight has steadily increased. Advised if still available, take a dose of metolazone today, then increase torsemide to 20mg  BID. If not, take torsemide 40mg  x 1 tonight, then 20mg  BID starting tomorrow. He has follow-up with the HF clinic on 12/2, but may need closer follow-up if weight continues to trend up. She understood and will continue to monitor.   Jacqulyn Bath, PA-C 11/23/2012 4:45 PM

## 2012-11-24 NOTE — ED Provider Notes (Signed)
Medical screening examination/treatment/procedure(s) were performed by non-physician practitioner and as supervising physician I was immediately available for consultation/collaboration.  EKG Interpretation    Date/Time:    Ventricular Rate:  86 PR Interval:  171 QRS Duration: 113 QT Interval:  495 QTC Calculation: 592 R Axis:   -87 Text Interpretation:  Ectopic atrial rhythm Left anterior fascicular block Abnormal R-wave progression, late transition Nonspecific T abnrm, anterolateral leads Prolonged QT interval ED PHYSICIAN INTERPRETATION AVAILABLE IN [place] [facility]             Sunnie Nielsen, MD 11/24/12 1349

## 2012-11-25 ENCOUNTER — Emergency Department (HOSPITAL_COMMUNITY)

## 2012-11-25 ENCOUNTER — Emergency Department (HOSPITAL_COMMUNITY)
Admission: EM | Admit: 2012-11-25 | Discharge: 2012-11-26 | Disposition: A | Discharge status: Home / Self Care | Attending: Emergency Medicine | Admitting: Emergency Medicine

## 2012-11-25 ENCOUNTER — Telehealth (HOSPITAL_COMMUNITY): Payer: Self-pay | Admitting: Cardiology

## 2012-11-25 DIAGNOSIS — N183 Chronic kidney disease, stage 3 unspecified: Secondary | ICD-10-CM | POA: Insufficient documentation

## 2012-11-25 DIAGNOSIS — I129 Hypertensive chronic kidney disease with stage 1 through stage 4 chronic kidney disease, or unspecified chronic kidney disease: Secondary | ICD-10-CM | POA: Insufficient documentation

## 2012-11-25 DIAGNOSIS — I5023 Acute on chronic systolic (congestive) heart failure: Secondary | ICD-10-CM | POA: Insufficient documentation

## 2012-11-25 DIAGNOSIS — R079 Chest pain, unspecified: Secondary | ICD-10-CM | POA: Diagnosis present

## 2012-11-25 DIAGNOSIS — Z7902 Long term (current) use of antithrombotics/antiplatelets: Secondary | ICD-10-CM | POA: Diagnosis not present

## 2012-11-25 DIAGNOSIS — M109 Gout, unspecified: Secondary | ICD-10-CM | POA: Diagnosis not present

## 2012-11-25 DIAGNOSIS — Z79899 Other long term (current) drug therapy: Secondary | ICD-10-CM | POA: Diagnosis not present

## 2012-11-25 DIAGNOSIS — Z9861 Coronary angioplasty status: Secondary | ICD-10-CM | POA: Diagnosis not present

## 2012-11-25 DIAGNOSIS — I2510 Atherosclerotic heart disease of native coronary artery without angina pectoris: Secondary | ICD-10-CM | POA: Insufficient documentation

## 2012-11-25 DIAGNOSIS — Z8639 Personal history of other endocrine, nutritional and metabolic disease: Secondary | ICD-10-CM | POA: Insufficient documentation

## 2012-11-25 DIAGNOSIS — Z8619 Personal history of other infectious and parasitic diseases: Secondary | ICD-10-CM | POA: Insufficient documentation

## 2012-11-25 DIAGNOSIS — I4891 Unspecified atrial fibrillation: Secondary | ICD-10-CM | POA: Diagnosis not present

## 2012-11-25 DIAGNOSIS — Z95810 Presence of automatic (implantable) cardiac defibrillator: Secondary | ICD-10-CM | POA: Insufficient documentation

## 2012-11-25 DIAGNOSIS — K219 Gastro-esophageal reflux disease without esophagitis: Secondary | ICD-10-CM | POA: Diagnosis not present

## 2012-11-25 DIAGNOSIS — I509 Heart failure, unspecified: Secondary | ICD-10-CM

## 2012-11-25 DIAGNOSIS — Z862 Personal history of diseases of the blood and blood-forming organs and certain disorders involving the immune mechanism: Secondary | ICD-10-CM | POA: Diagnosis not present

## 2012-11-25 DIAGNOSIS — I252 Old myocardial infarction: Secondary | ICD-10-CM | POA: Insufficient documentation

## 2012-11-25 MED ORDER — ONDANSETRON HCL 4 MG/2ML IJ SOLN
4.0000 mg | Freq: Once | INTRAMUSCULAR | Status: AC
  Administered 2012-11-26: 4 mg via INTRAVENOUS
  Filled 2012-11-25: qty 2

## 2012-11-25 MED ORDER — FUROSEMIDE 10 MG/ML IJ SOLN
40.0000 mg | Freq: Once | INTRAMUSCULAR | Status: AC
  Administered 2012-11-26: 40 mg via INTRAVENOUS
  Filled 2012-11-25: qty 4

## 2012-11-25 MED ORDER — MORPHINE SULFATE 4 MG/ML IJ SOLN
4.0000 mg | Freq: Once | INTRAMUSCULAR | Status: AC
  Administered 2012-11-26: 4 mg via INTRAVENOUS
  Filled 2012-11-25: qty 1

## 2012-11-25 NOTE — ED Notes (Signed)
Per EMS pt has recurrent Chest pain; states pain started this AM; Pt states pain came on suddenly while doing nothing. Pt states pain is shooting pain in generalized chest. Pt c/o itching bilaterally from the waist up front and back. Per EMS EKG show new onset of first degree heart block. Pt c/o SOB, dizziness, and lightheadedness upon standing. 22G placed in Left hand. Pt has Central line in left side of chest. Pt is connected heart med through central line Milrinone.

## 2012-11-25 NOTE — ED Provider Notes (Signed)
CSN: [id number]     Arrival date & time 11/25/12  2236 History   First MD Initiated Contact with Patient 11/25/12 2329     Chief Complaint  Patient presents with  . Chest Pain   (Consider location/radiation/quality/duration/timing/severity/associated sxs/prior Treatment) HPI HX per PT - CP and SOB, weighs 243 today and states baseline 235-237.  He has end stage CHF and is followed by heart failure clinic. He is on home hospice. He had blood work today drawn by Patent examiner. Tonight he states the a flutter is out of control, he feels bloated and has worsening CP and SOB/ DOE. Symptoms mod in severity. He is requesting IV lasix and IV morphine. CP is substernal sharp and does not radiate Past Medical History  Diagnosis Date  . CHF (congestive heart failure)   . Sarcoidosis   . Cardiomyopathy, dilated, nonischemic     non ischemic by cath  . Acute on chronic systolic heart failure   . Automatic implantable cardiac defibrillator in situ   . Atrial fibrillation   . NSVT (nonsustained ventricular tachycardia)   . GERD (gastroesophageal reflux disease)   . Hypercholesteremia   . Myocardial infarction   . Shortness of breath   . Chronic kidney disease (CKD), stage III (moderate)   . Pacemaker   . Anginal pain   . Gout   . Hypertension     [name]  . Coronary artery disease    Past Surgical History  Procedure Laterality Date  . Back surgery  1987    Ruptured disk repair  . Pacemaker insertion      with ICD  . Tee without cardioversion  01/17/2011    Procedure: TRANSESOPHAGEAL ECHOCARDIOGRAM (TEE);  Surgeon: Ricki Rodriguez, MD;  Location: [facility] ENDOSCOPY;  Service: Cardiovascular;  Laterality: N/A;  . Cardioversion  01/17/2011    Procedure: CARDIOVERSION;  Surgeon: Ricki Rodriguez, MD;  Location: [facility] ENDOSCOPY;  Service: Cardiovascular;  Laterality: N/A;  . Cardiac catheterization    . Insert / replace / remove pacemaker    . Anterior cervical decomp/discectomy fusion  08/21/2011     Procedure: ANTERIOR CERVICAL DECOMPRESSION/DISCECTOMY FUSION 2 LEVELS;  Surgeon: Eldred Manges, MD;  Location: [facility] OR;  Service: Orthopedics;  Laterality: N/A;  C5-6, C6-7 Anterior Cervical Discectomy and Fusion, allograft, plate  . Tee without cardioversion N/A 02/16/2012    Procedure: TRANSESOPHAGEAL ECHOCARDIOGRAM (TEE);  Surgeon: Dolores Patty, MD;  Location: [facility] ENDOSCOPY;  Service: Cardiovascular;  Laterality: N/A;  original case scheduled under his dad (who is deceased), rescheduled under correct mrn/pt/dob. [place]/dl  . Tee without cardioversion N/A 03/22/2012    Procedure: TRANSESOPHAGEAL ECHOCARDIOGRAM (TEE);  Surgeon: Lewayne Bunting, MD;  Location: [facility] ENDOSCOPY;  Service: Cardiovascular;  Laterality: N/A;  . Cardioversion N/A 03/22/2012    Procedure: CARDIOVERSION;  Surgeon: Lewayne Bunting, MD;  Location: [facility] ENDOSCOPY;  Service: Cardiovascular;  Laterality: N/A;  . Cardioversion N/A 04/26/2012    Procedure: CARDIOVERSION;  Surgeon: Dolores Patty, MD;  Location: [facility] ENDOSCOPY;  Service: Cardiovascular;  Laterality: N/A;  . Central venous catheter tunneled insertion single lumen  09/16/2012    right IJ  . Cardioversion N/A 11/08/2012    Procedure: CARDIOVERSION;  Surgeon: Dolores Patty, MD;  Location: [facility] ENDOSCOPY;  Service: Cardiovascular;  Laterality: N/A;   Family History  Problem Relation Age of Onset  . Heart disease    . Heart failure    . Stroke    . Anesthesia problems Neg Hx   .  Hypotension Neg Hx   . Malignant hyperthermia Neg Hx   . Pseudochol deficiency Neg Hx    History  Substance Use Topics  . Smoking status: Never Smoker   . Smokeless tobacco: Never Used  . Alcohol Use: No    Review of Systems  Constitutional: Negative for fever and chills.  Eyes: Negative for visual disturbance.  Respiratory: Positive for shortness of breath. Negative for cough.   Cardiovascular: Positive for chest pain.  Gastrointestinal: Negative for abdominal pain.   Genitourinary: Negative for flank pain.  Musculoskeletal: Negative for back pain.  Skin: Negative for rash.  Neurological: Negative for headaches.  All other systems reviewed and are negative.    Allergies  Nitroglycerin  Home Medications   Current Outpatient Rx  Name  Route  Sig  Dispense  Refill  . allopurinol (ZYLOPRIM) 100 MG tablet   Oral   Take 1 tablet (100 mg total) by mouth daily.   30 tablet   6   . amiodarone (PACERONE) 200 MG tablet   Oral   Take 200 mg by mouth 2 (two) times daily.         Marland Kitchen apixaban (ELIQUIS) 5 MG TABS tablet   Oral   Take 1 tablet (5 mg total) by mouth 2 (two) times daily.   60 tablet   4     Please file dose change.   . carvedilol (COREG) 3.125 MG tablet   Oral   Take 3.125 mg by mouth 2 (two) times daily with a meal.         . esomeprazole (NEXIUM) 40 MG capsule   Oral   Take 40 mg by mouth daily.          . fluticasone (FLONASE) 50 MCG/ACT nasal spray   Each Nare   Place 2 sprays into both nostrils daily as needed for allergies.          Marland Kitchen LORazepam (ATIVAN) 0.5 MG tablet   Oral   Take 1-2 tablets (0.5-1 mg total) by mouth 2 (two) times daily as needed for anxiety.   50 tablet   0   . metolazone (ZAROXOLYN) 2.5 MG tablet   Oral   Take 2.5 mg by mouth daily as needed (for fluid retention).         . milrinone (PRIMACOR) 20 MG/100ML SOLN infusion   Intravenous   Inject 37.3125 mcg/min into the vein continuous.   100 mL      . oxyCODONE-acetaminophen (PERCOCET/ROXICET) 5-325 MG per tablet   Oral   Take 1-2 tablets by mouth every 4 (four) hours as needed for severe pain.   30 tablet   0   . potassium chloride SA (K-DUR,KLOR-CON) 20 MEQ tablet   Oral   Take 2 tablets (40 mEq total) by mouth daily.   180 tablet   6     Please file dose change patient has medication   . sildenafil (VIAGRA) 100 MG tablet   Oral   Take 1 tablet (100 mg total) by mouth daily as needed for erectile dysfunction.   4  tablet   1   . spironolactone (ALDACTONE) 25 MG tablet   Oral   Take 0.5 tablets (12.5 mg total) by mouth daily.   90 tablet   3   . torsemide (DEMADEX) 20 MG tablet   Oral   Take 20 mg by mouth 2 (two) times daily.          BP 114/81  Pulse 83  Resp  17  Ht 6\' 2"  (1.88 m)  Wt 248 lb (112.492 kg)  BMI 31.83 kg/m2  SpO2 98% Physical Exam  Constitutional: He is oriented to person, place, and time. He appears well-developed and well-nourished.  HENT:  Head: Normocephalic and atraumatic.  Eyes: EOM are normal. Pupils are equal, round, and reactive to light.  Neck: Neck supple.  Cardiovascular: Normal rate, regular rhythm and intact distal pulses.   Pulmonary/Chest: Effort normal and breath sounds normal. No respiratory distress. He exhibits no tenderness.  Musculoskeletal: Normal range of motion.  Trace pretibial edema  Neurological: He is alert and oriented to person, place, and time.  Skin: Skin is warm and dry.    ED Course  Procedures (including critical care time) Labs Review Labs Reviewed  PRO B NATRIURETIC PEPTIDE - Abnormal; Notable for the following:    Pro B Natriuretic peptide (BNP) 263.2 (*)    All other components within normal limits  POCT I-STAT, CHEM 8 - Abnormal; Notable for the following:    BUN 52 (*)    Creatinine, Ser 3.10 (*)    Glucose, Bld 119 (*)    All other components within normal limits  CBC  POCT I-STAT TROPONIN I   Imaging Review Dg Chest Portable 1 View  11/26/2012   CLINICAL DATA:  Chest pain. Shortness of breath. History of previous heart attacks.  EXAM: PORTABLE CHEST - 1 VIEW  COMPARISON:  11/16/2012  FINDINGS: Stable appearance of cardiac pacemaker, right central venous catheter, and postoperative changes in the cervical spine. Superimposed wires obscure visualization of are of the right chest. The heart size and pulmonary vascularity appear normal for technique. No focal airspace consolidation in the lungs. No pleural effusion or  pneumothorax. No significant change since previous study.  IMPRESSION: No active disease.   Electronically Signed   By: Burman Nieves M.D.   On: 11/26/2012 00:20    EKG Interpretation    Date/Time:  Monday November 25 2012 22:40:41 EST Ventricular Rate:  84 PR Interval:  237 QRS Duration: 120 QT Interval:  463 QTC Calculation: 547 R Axis:   -75 Text Interpretation:  Sinus rhythm Prolonged PR interval Left axis deviation Non-specific intra-ventricular conduction delay Nonspecific ST abnormality Abnormal ECG Confirmed by Keily Lepp  MD, Glen Kesinger ([id number]) on 11/25/2012 11:36:16 PM           Lasix and morphine provided with good UOP and pain resolved. No hypoxia or destas.   2:11 AM recheck pain resolved - PT feeling better.  2:35 AM d/w CAR, reviewed ECG, Labs, medication and CXR as above, recs OK for d/c home, consider inc torsemide for a few days and call Dr Gala Romney in the am for further recommendations.  PT agreeable to plan, is reliable and stable at this time for discharge  MDM  Dx: CP, CHF  ECG, labs, CXR Medications provided Symptomatically improved CAR consulted, plan close outpatient follow up    Sunnie Nielsen, MD 11/26/12 [phone]

## 2012-11-25 NOTE — Telephone Encounter (Signed)
Pam called with  Concerns of increased weight (244) Pt is holding fluid around belly and chest area Lungs are clear Pt was given increased torsemide and metolazone x 1 Torsemide was increased to BID after Saturday Pt is not doing well  Please advise

## 2012-11-25 NOTE — Telephone Encounter (Signed)
Discussed w/Amy Filbert Schilder, NP she does not feel like this is fluid, spoke w/Pam she states she did draw labs today and pt did increase Torsemide to 20 mg bid yesterday, will await lab results and check on pt tomorrow

## 2012-11-26 LAB — CBC
HCT: 40.4 % (ref 39.0–52.0)
Hemoglobin: 14.2 g/dL (ref 13.0–17.0)
MCH: 31.8 pg (ref 26.0–34.0)
MCHC: 35.1 g/dL (ref 30.0–36.0)
MCV: 90.4 fL (ref 78.0–100.0)
RBC: 4.47 MIL/uL (ref 4.22–5.81)
WBC: 5 10*3/uL (ref 4.0–10.5)

## 2012-11-26 LAB — POCT I-STAT, CHEM 8
BUN: 52 mg/dL — ABNORMAL HIGH (ref 6–23)
Calcium, Ion: 1.2 mmol/L (ref 1.13–1.30)
Chloride: 103 mEq/L (ref 96–112)
Creatinine, Ser: 3.1 mg/dL — ABNORMAL HIGH (ref 0.50–1.35)
Potassium: 3.9 mEq/L (ref 3.5–5.1)
Sodium: 139 mEq/L (ref 135–145)
TCO2: 23 mmol/L (ref 0–100)

## 2012-11-26 LAB — POCT I-STAT TROPONIN I: Troponin i, poc: 0.03 ng/mL (ref 0.00–0.08)

## 2012-11-26 NOTE — Telephone Encounter (Signed)
Pt was seen in ER last night and given IV Lasix he states wt is down to 241 today, per Dr Gala Romney go back to Torsemide he was on before hospitalization, pt aware and agreeable he will go back to Torsemide 40 am and 20 pm, Pam is also aware and order given for her to draw bmet on Fri

## 2012-11-30 IMAGING — CR DG CHEST 2V
2 series · 2 of 2 positions shown · non-contrast
Comparison: 04/25/2011

CLINICAL DATA: Chest pain, shortness of breath.

CHEST - 2 VIEW

[w chest pa]
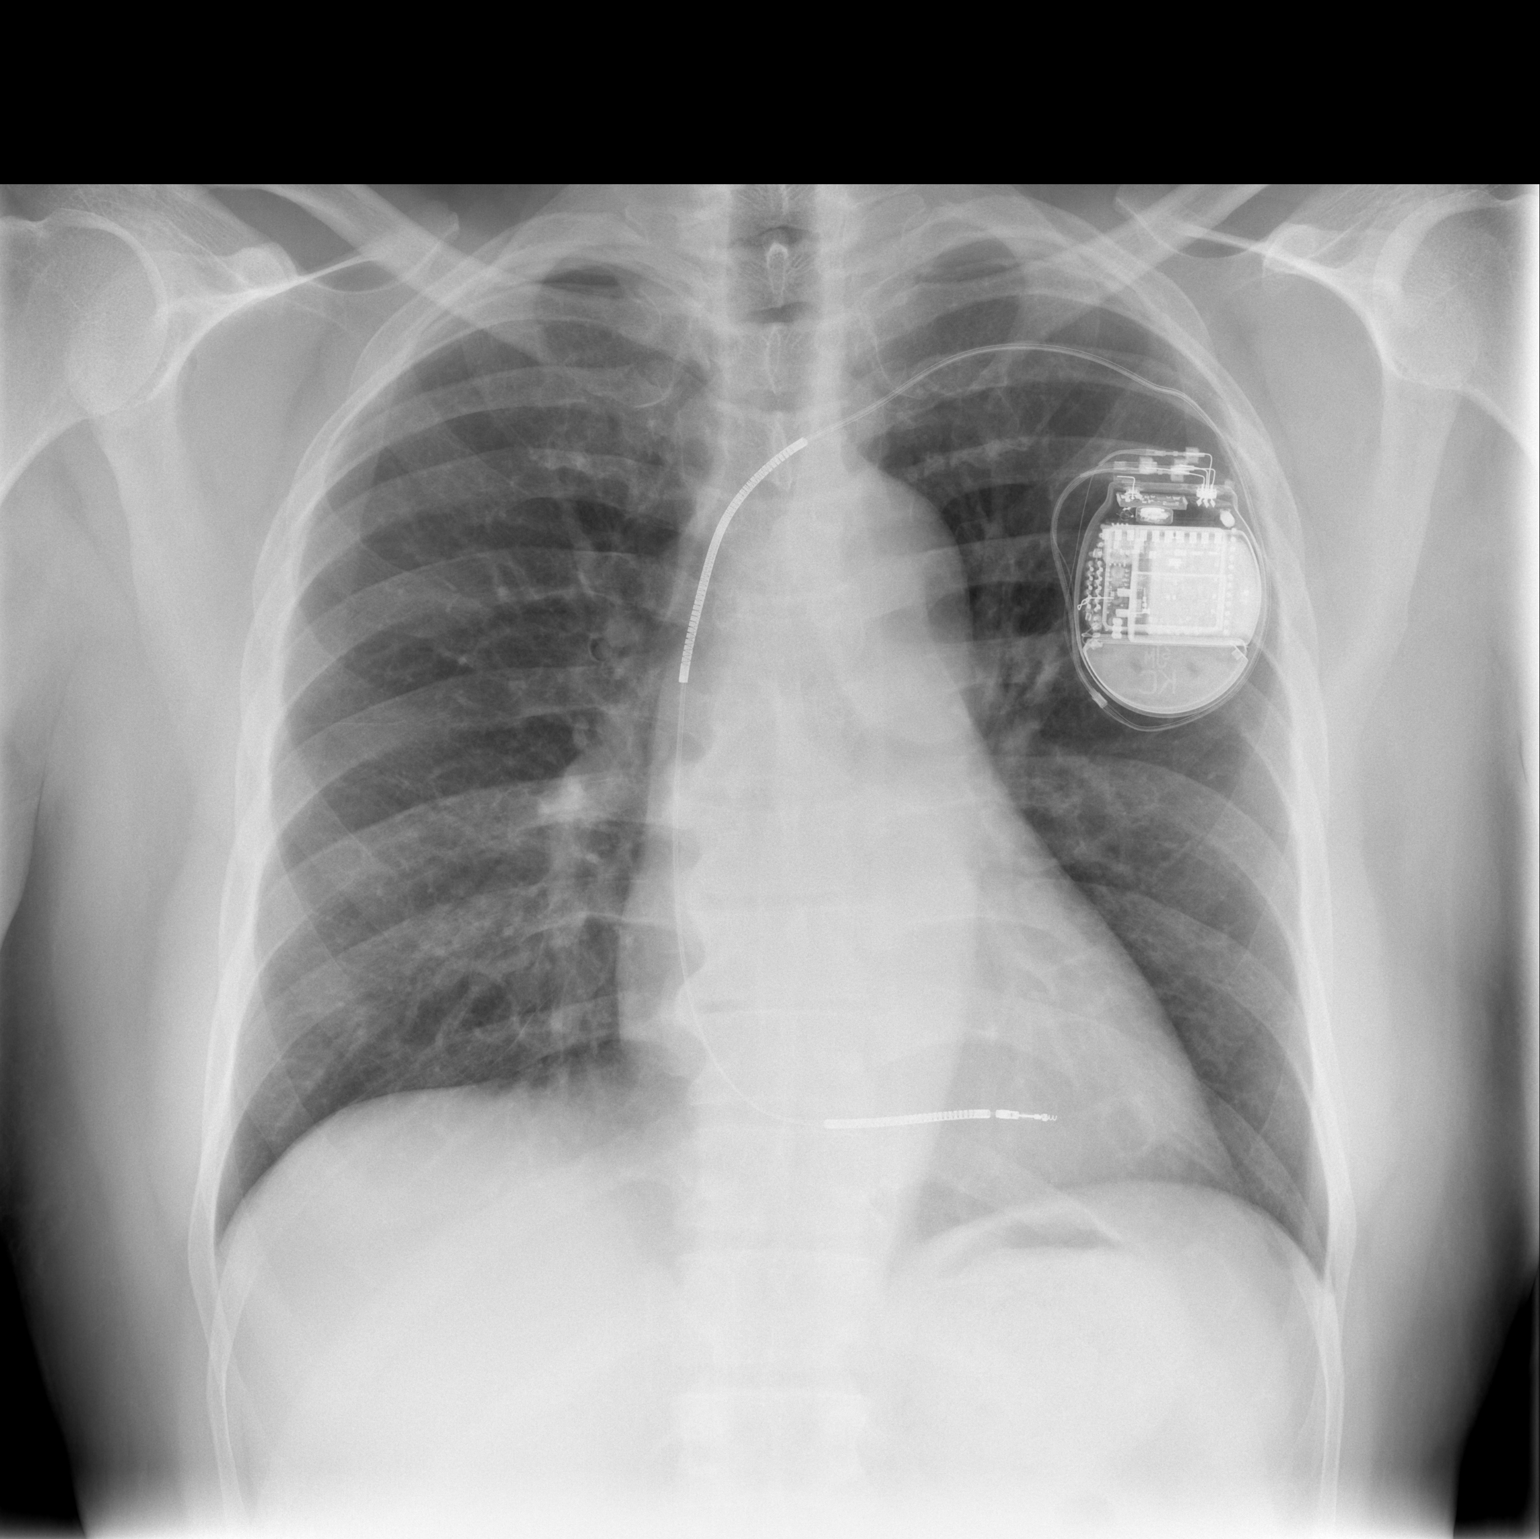

[w chest lat]
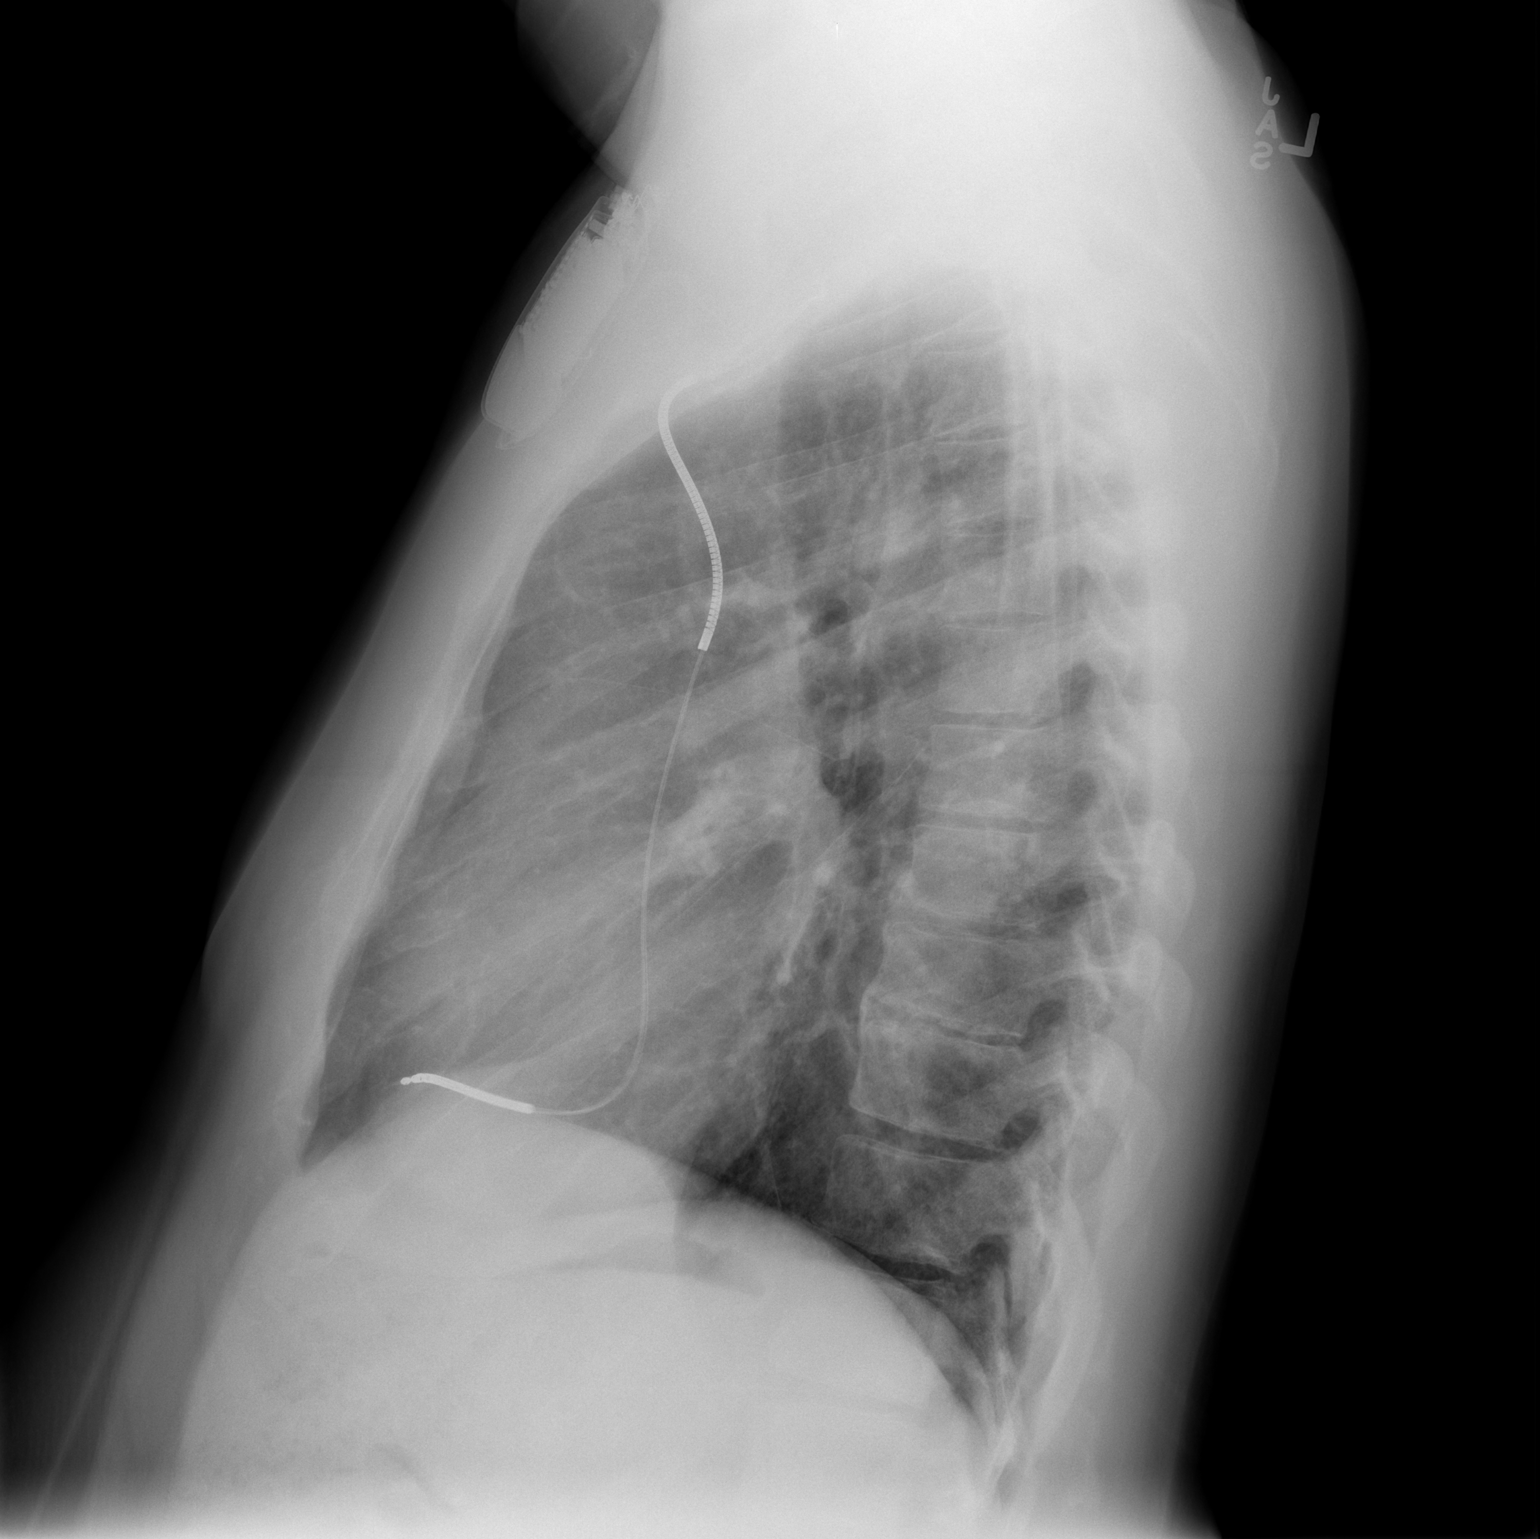

[2 of 2 positions shown; findings below may reference images not displayed]

FINDINGS: Heart and mediastinal contours are within normal limits.
No focal opacities or effusions.  No acute bony abnormality. Left
AICD remains in place, unchanged.
IMPRESSION: No active cardiopulmonary disease.

## 2012-12-03 ENCOUNTER — Ambulatory Visit (HOSPITAL_COMMUNITY)
Admission: RE | Admit: 2012-12-03 | Discharge: 2012-12-03 | Disposition: A | Discharge status: Home / Self Care | Source: Ambulatory Visit | Attending: Internal Medicine | Admitting: Internal Medicine

## 2012-12-03 ENCOUNTER — Encounter (HOSPITAL_COMMUNITY): Payer: Self-pay

## 2012-12-03 VITALS — BP 114/88 | HR 89 | Wt 241.0 lb

## 2012-12-03 DIAGNOSIS — I509 Heart failure, unspecified: Secondary | ICD-10-CM | POA: Insufficient documentation

## 2012-12-03 DIAGNOSIS — I4891 Unspecified atrial fibrillation: Secondary | ICD-10-CM

## 2012-12-03 DIAGNOSIS — N184 Chronic kidney disease, stage 4 (severe): Secondary | ICD-10-CM | POA: Insufficient documentation

## 2012-12-03 DIAGNOSIS — I129 Hypertensive chronic kidney disease with stage 1 through stage 4 chronic kidney disease, or unspecified chronic kidney disease: Secondary | ICD-10-CM | POA: Insufficient documentation

## 2012-12-03 DIAGNOSIS — N182 Chronic kidney disease, stage 2 (mild): Secondary | ICD-10-CM

## 2012-12-03 DIAGNOSIS — I252 Old myocardial infarction: Secondary | ICD-10-CM | POA: Insufficient documentation

## 2012-12-03 DIAGNOSIS — N189 Chronic kidney disease, unspecified: Secondary | ICD-10-CM

## 2012-12-03 DIAGNOSIS — I5022 Chronic systolic (congestive) heart failure: Secondary | ICD-10-CM | POA: Insufficient documentation

## 2012-12-03 NOTE — Patient Instructions (Signed)
Have a wonderful Christmas and New Year!  Have fun in [place].  Will call with lab results.  Follow up in 4-6 weeks.  Do the following things EVERYDAY: 1) Weigh yourself in the morning before breakfast. Write it down and keep it in a log. 2) Take your medicines as prescribed 3) Eat low salt foods-Limit salt (sodium) to 2000 mg per day.  4) Stay as active as you can everyday 5) Limit all fluids for the day to less than 2 liters 6)

## 2012-12-03 NOTE — Progress Notes (Signed)
Patient ID: [name], male   DOB: 1950/12/19, 62 y.o.   MRN: [id number]  HPI: [name] is a 62 y.o. gentlemen with severe HF due to NICM (EF 20-25%) with multiple [name] admissions for HF exacerbations. He also has history of ventricular tachycardia s/p [employer] ICD by Dr. Ladona [name], CKD: stage IV, baseline Cr ~2, and paroxysmal atrial arrhythmias on amiodarone as well as sarcoidosis and hypertension. Cath in 2008 by Dr. Sharyn [name] showed no CAD.Seen at [name] and not felt to be a transplant candidate due to renal failure and lack of family support.  Milrinone initiated in January 2014 for low output. 03/01/12 milrinone increased 0.366mc/kg/min.   RHC 01/26/12  RA = 14  RV = 51/16/15  PA = 57/34 (43)  PCW = 26  Fick cardiac output/index = 4.2/1.8  Thermo CO/CI = 3.9/1.7  PVR = 4.0 [place]  O2 sat = 97%  PA sat = 57%, 58%  Ao Pressure (non-invasive): 116/80 (93)  SVR = 1504   ECHO 03/22/12 EF 15%   04/26/12 S/P Successful DC-CV for atrial fibrillation. Remains on amiodarone.   08/15/12 Creatinine 4.24 Potassium 3.3 09/19/12 Creatinine 3.98, Potassium 3.1, BUN 78 10/07/12 Creatinine 2.62, Potassium 3.4 10/18/12 Creatinine 3.3, K+3.1 11/08/12 Creatinine 2.15 K 4.0 11/25/12 Creatinine 2.93, K+ 3.5  Post [name] follow up: Was admitted to [name] 11/16/12 for mid-sternal CP, CVP in [name] 4. Went to ED 11/25/12 told by Hospice nurse for heart palpitations, he was given IV lasix and morphine and sent home. Feeling ok today. Denies SOB, CP, palpitations or edema. +orthopnea sleeps in [name] bed. Following low salt diet and drinking less than 2L a day.   He is not on an ace inhibitor due to elevated creatinine. Not on nitrates due to Viagra use.   ROS: All systems negative except as listed in HPI, PMH and Problem List.  Past Medical History  Diagnosis Date  . CHF (congestive heart failure)   . Sarcoidosis   . Cardiomyopathy, dilated, nonischemic     non ischemic by cath  . Acute on  chronic systolic heart failure   . Automatic implantable cardiac defibrillator in situ   . Atrial fibrillation   . NSVT (nonsustained ventricular tachycardia)   . GERD (gastroesophageal reflux disease)   . Hypercholesteremia   . Myocardial infarction   . Shortness of breath   . Chronic kidney disease (CKD), stage III (moderate)   . Pacemaker   . Anginal pain   . Gout   . Hypertension     dr [name]  . Coronary artery disease    Current Outpatient Prescriptions  Medication Sig Dispense Refill  . allopurinol (ZYLOPRIM) 100 MG tablet Take 1 tablet (100 mg total) by mouth daily.  30 tablet  6  . amiodarone (PACERONE) 200 MG tablet Take 200 mg by mouth 2 (two) times daily.      Marland Kitchen apixaban (ELIQUIS) 5 MG TABS tablet Take 1 tablet (5 mg total) by mouth 2 (two) times daily.  60 tablet  4  . carvedilol (COREG) 3.125 MG tablet Take 3.125 mg by mouth 2 (two) times daily with a meal.      . esomeprazole (NEXIUM) 40 MG capsule Take 40 mg by mouth daily.       . fluticasone (FLONASE) 50 MCG/ACT nasal spray Place 2 sprays into both nostrils daily as needed for allergies.       Marland Kitchen LORazepam (ATIVAN) 0.5 MG tablet Take 1-2 tablets (0.5-1 mg total) by mouth 2 (two)  times daily as needed for anxiety.  50 tablet  0  . metolazone (ZAROXOLYN) 2.5 MG tablet Take 2.5 mg by mouth daily as needed (for fluid retention).      . milrinone (PRIMACOR) 20 MG/100ML SOLN infusion Inject 37.3125 mcg/min into the vein continuous.  100 mL    . oxyCODONE-acetaminophen (PERCOCET/ROXICET) 5-325 MG per tablet Take 1-2 tablets by mouth every 4 (four) hours as needed for severe pain.  30 tablet  0  . potassium chloride SA (K-DUR,KLOR-CON) 20 MEQ tablet Take 2 tablets (40 mEq total) by mouth daily.  180 tablet  6  . sildenafil (VIAGRA) 100 MG tablet Take 1 tablet (100 mg total) by mouth daily as needed for erectile dysfunction.  4 tablet  1  . spironolactone (ALDACTONE) 25 MG tablet Take 0.5 tablets (12.5 mg total) by mouth  daily.  90 tablet  3  . torsemide (DEMADEX) 20 MG tablet Take 20 mg by mouth 2 (two) times daily.       No current facility-administered medications for this encounter.   Filed Vitals:   12/03/12 0846  BP: 114/88  Pulse: 89  Weight: 241 lb (109.317 kg)  SpO2: 98%   PHYSICAL EXAM: General: Well appearing. NAD   HEENT: normal  Neck: supple. JVP flat. Carotids 2+ bilat; no bruits. No lymphadenopathy or thryomegaly appreciated.  Cor: PMI laterally displaced. regular S1S2 with +S3 and 2/6 HSM apex. R upper chest hickman site Lungs: clear Abdomen: soft, nontender, obese, mildly distended, No bruits or masses. Good bowel sounds.  Extremities: no cyanosis, clubbing, rash, no lower extremity edema  Neuro: alert & orientedx3, cranial nerves grossly intact. moves all 4 extremities w/o difficulty. Affect pleasant    ASSESSMENT & PLAN:   1) Chronic systolic HF: Nonischemic cardiomyopathy with EF 15% (03/2012) - [name] is unfortunately suffering from end stage HF on home milrinone. He continues to have frequent readmissions or ED visits d/t SOB and CP. He is actively followded by [name] and gets weekly labs. - Currently NYHA IIIb symptoms and volume status stable. Was recently admitted to the [name] and CVP 4. Will continue torsemide 20 mg BID. Labs today with hospice.    - Continue milrinone 0.375 mcg through [facility] cath - Remain on coreg 3.125 mg BID and Spiro 12.5 mg daily (for severe hypokalemia) - No ACEI with CKD - Patient would like to go to [name] for the holidays. Will call Hospice and see if we can arrange for another Hospice to follow him while he is there. Biggest issue right now is symptom management and keeping the patient home and out of the [name]. If hospice in [name] will accept patient will provide them with number to call with questions. When he ends up in ED usually receives morphine and IV lasix with symptom management. It would be great if  this could be done at home.   2) Atrial fibrillation/flutter: Paroxysmal - He is in NSR today. Continue eliquis 5 mg BID and amiodarone 400 mg daily. He is not having any bleeding issues. - recent  3) CKD, stage IV - BMET today with hospice and will review and address.  Follow up 4-6 weeks [name] B NP-C 8:54 AM

## 2012-12-05 ENCOUNTER — Telehealth (HOSPITAL_COMMUNITY): Payer: Self-pay | Admitting: Anesthesiology

## 2012-12-05 NOTE — Telephone Encounter (Signed)
Reviewed weekly labs.  K+ 3.4 and Cr 2.53  Take extra 20 meq of potassium today. Patient aware. Will follow up weekly labs on milrinone.  [name] B NP-C 4:47 PM

## 2012-12-09 ENCOUNTER — Encounter: Payer: Self-pay | Admitting: Internal Medicine

## 2012-12-09 DIAGNOSIS — I5022 Chronic systolic (congestive) heart failure: Secondary | ICD-10-CM

## 2012-12-11 ENCOUNTER — Telehealth (HOSPITAL_COMMUNITY): Payer: Self-pay | Admitting: Cardiology

## 2012-12-11 NOTE — Telephone Encounter (Signed)
Need order to begin O2, pt will be in town until 01/04/13 Pt states he does have O 2 in [place] that he uses at home prn  Ok to send Rx to [facility] fax# [phone] or to Wynona Canes and she will take care of the rest

## 2012-12-11 NOTE — Telephone Encounter (Signed)
Can not find that we ever ordered oxygen, per [name], NP when she has tested him in clinic is o2 never dropped low enough to qualify for oxygen, unsure how he had oxygen in the past unless it was from a hospital stay, left Christine a VM that we can not order as pt does not meet criteria if they test him and he is low then can order

## 2012-12-16 ENCOUNTER — Telehealth (HOSPITAL_COMMUNITY): Payer: Self-pay | Admitting: *Deleted

## 2012-12-16 NOTE — Telephone Encounter (Signed)
Received labs from [facility], K 3.3 per [name], NP take extra 40 meq KCL today, pt aware

## 2012-12-23 ENCOUNTER — Telehealth (HOSPITAL_COMMUNITY): Payer: Self-pay

## 2012-12-23 MED ORDER — POTASSIUM CHLORIDE CRYS ER 20 MEQ PO TBCR: 40.0000 meq | EXTENDED_RELEASE_TABLET | Freq: Three times a day (TID) | ORAL | Status: DC

## 2012-12-23 NOTE — Telephone Encounter (Signed)
k 3.4, pt instructed to take TID (was taking BID), patient aware and agreeable, no questions or concerns.

## 2012-12-25 ENCOUNTER — Other Ambulatory Visit (HOSPITAL_COMMUNITY): Payer: Self-pay | Admitting: Adult Health

## 2013-01-04 IMAGING — CT CT CERVICAL SPINE W/O CM
5 series · 16 of 33 positions shown, 18 images · non-contrast
Comparison: None.

CLINICAL DATA: Neck pain.  Upper back pain.  Swelling and right arm
pain.  Numbness and weakness on the right.

CT CERVICAL SPINE WITHOUT CONTRAST
TECHNIQUE: Multidetector CT imaging of the cervical spine was
performed. Multiplanar CT image reconstructions were also
generated.

[Series 2: c spine bone · axial · 0.27mm/px · z∈[-176,-86]mm · 3 of 74 slices shown, 4 images]
[im 19/74  soft-tissue]
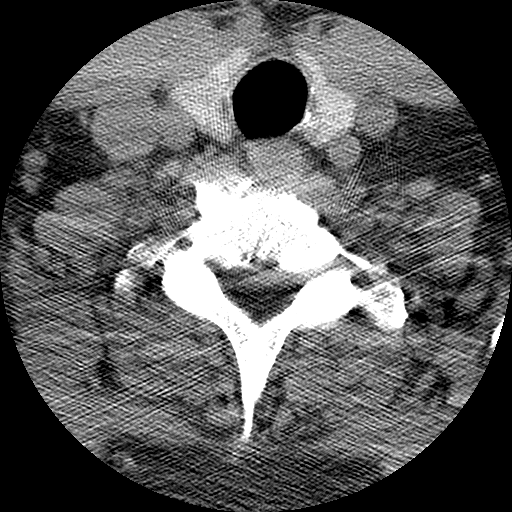
[im 19/74  bone]
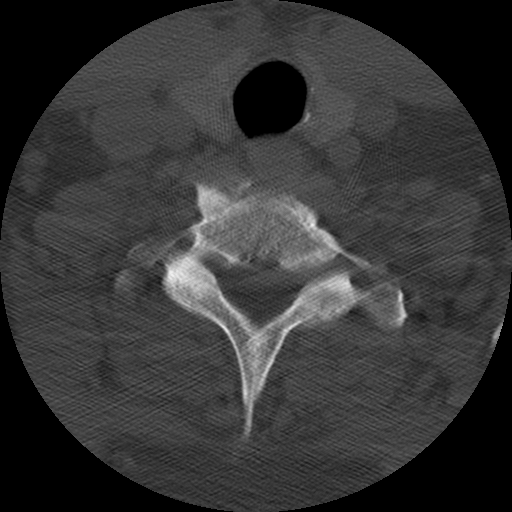
[im 37/74  bone]
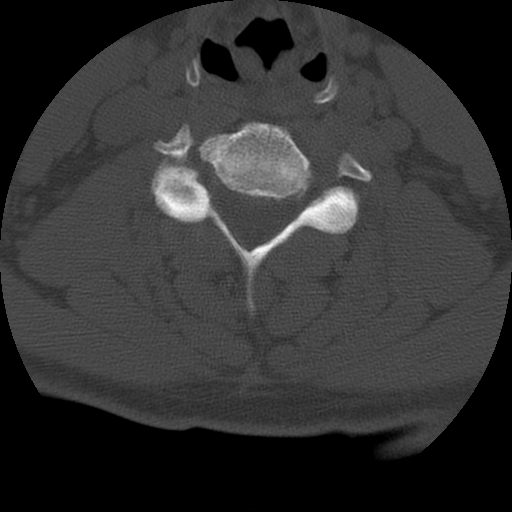
[im 55/74  bone]
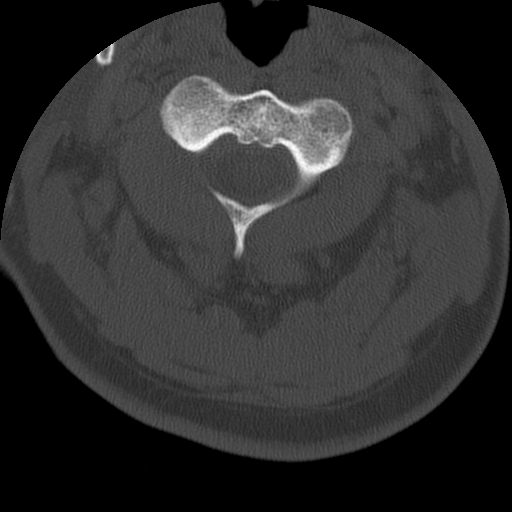

[Series 3: c spine soft · axial · 0.27mm/px · z∈[-161,-101]mm · 2 of 74 slices shown]
[im 25/74  soft-tissue]
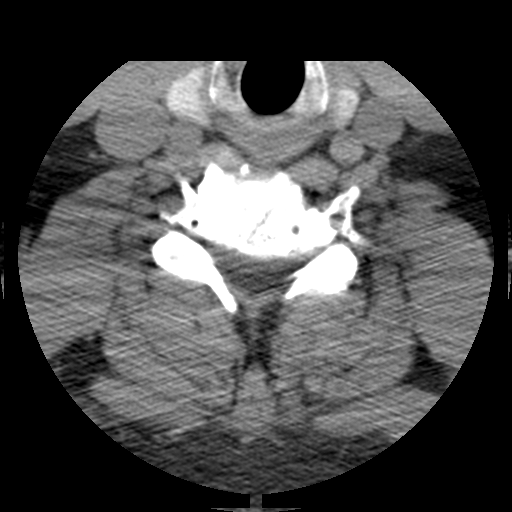
[im 49/74  soft-tissue]
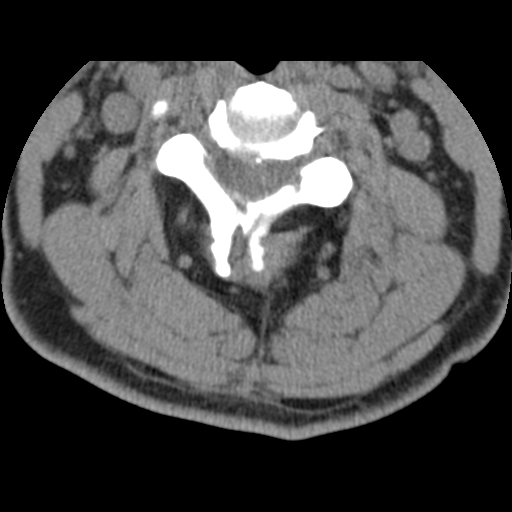

[Series 103: axial · axial · 0.23mm/px · z∈[-183,-101]mm · 3 of 89 slices shown]
[im 23/89  bone]
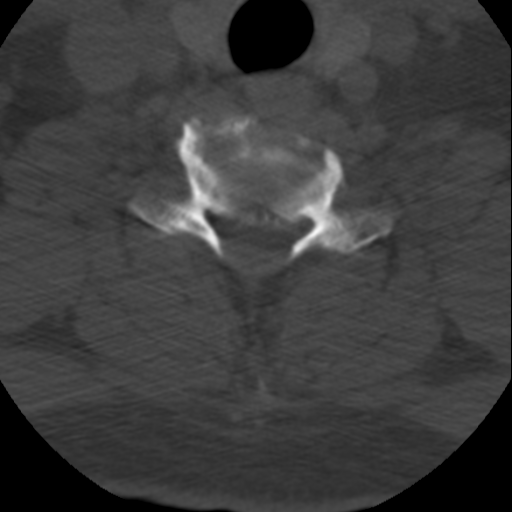
[im 45/89  bone]
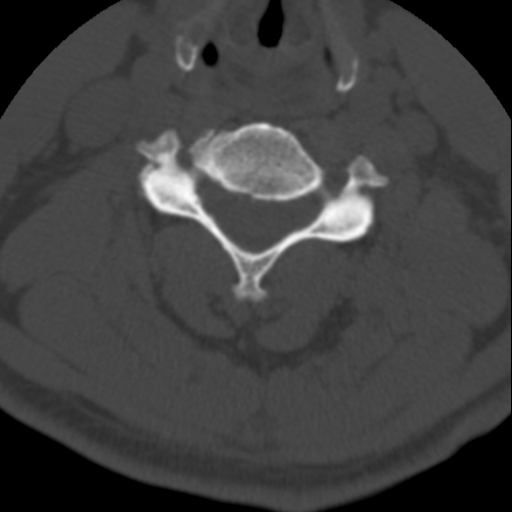
[im 67/89  bone]
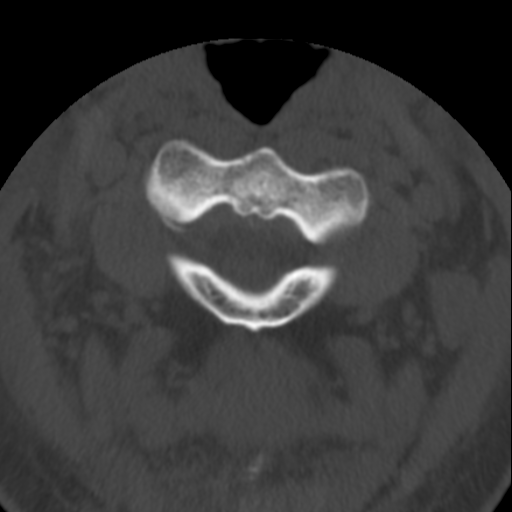

[Series 400: cor · coronal · 0.37mm/px · 3 of 42 slices shown]
[im 9/42  bone]
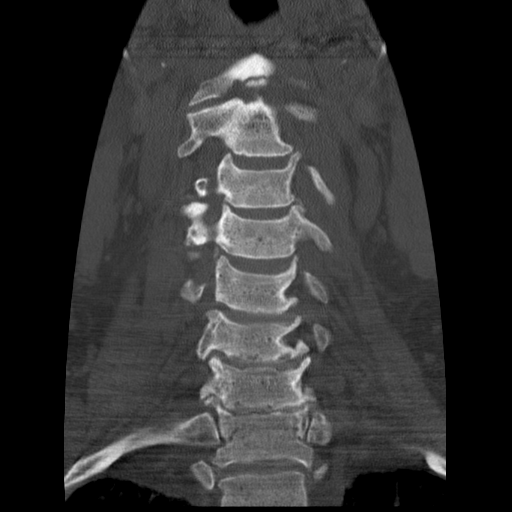
[im 17/42  bone]
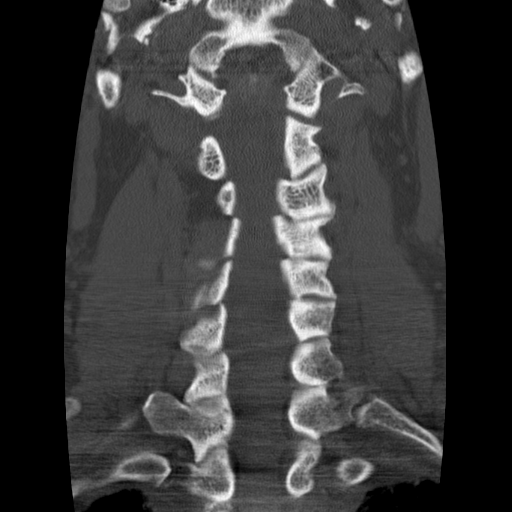
[im 25/42  bone]
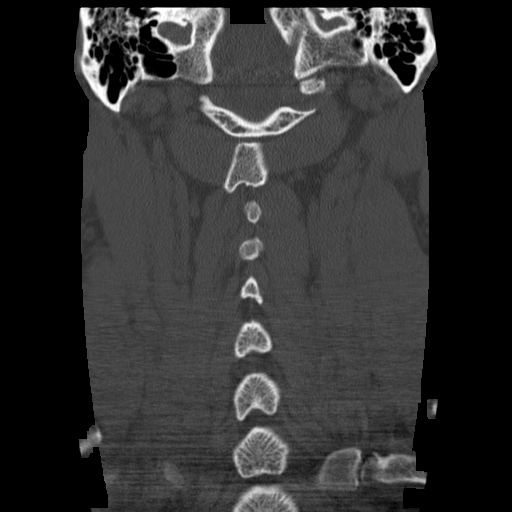

[Series 401: sag · sagittal · 0.37mm/px · 5 of 42 slices shown, 6 images]
[im 14/42  bone]
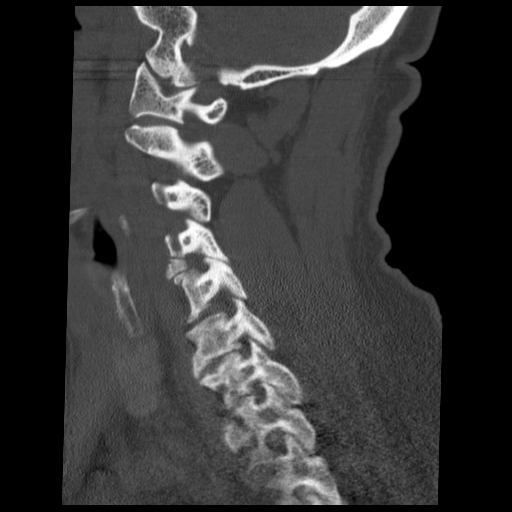
[im 18/42  bone]
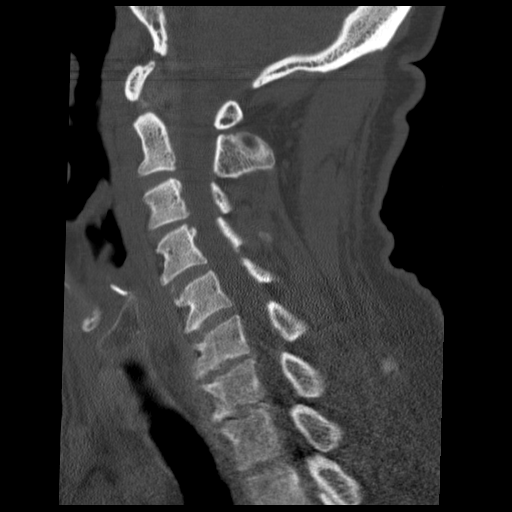
[im 21/42  soft-tissue]
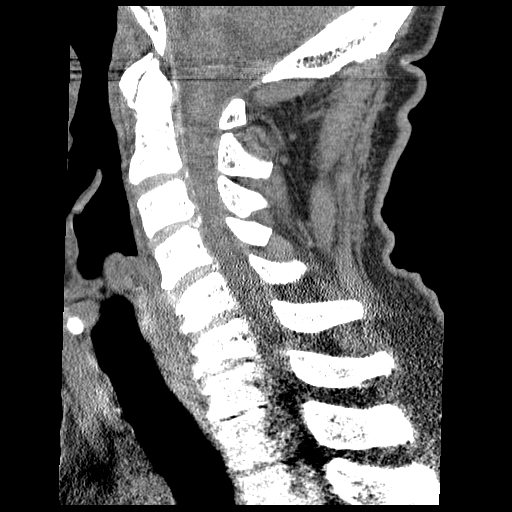
[im 21/42  bone]
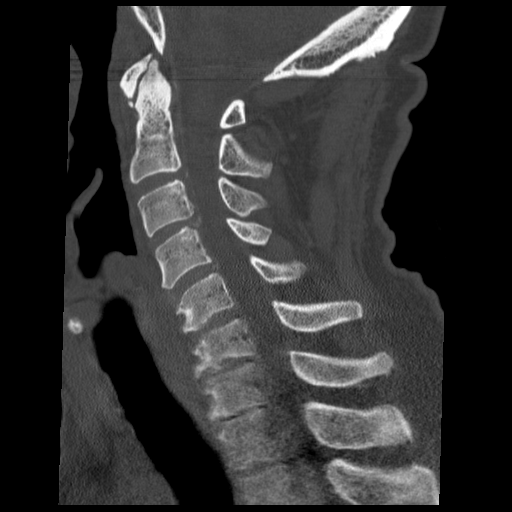
[im 24/42  bone]
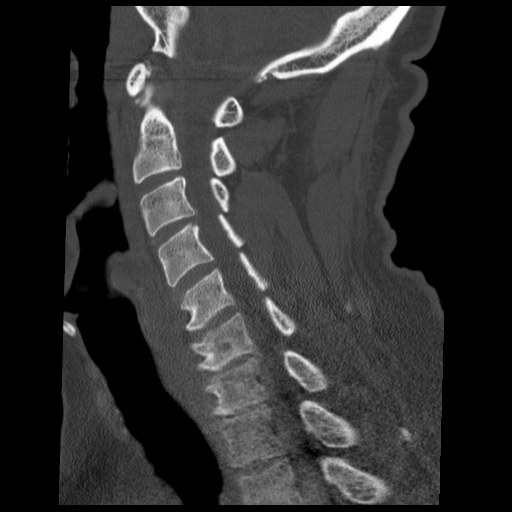
[im 28/42  bone]
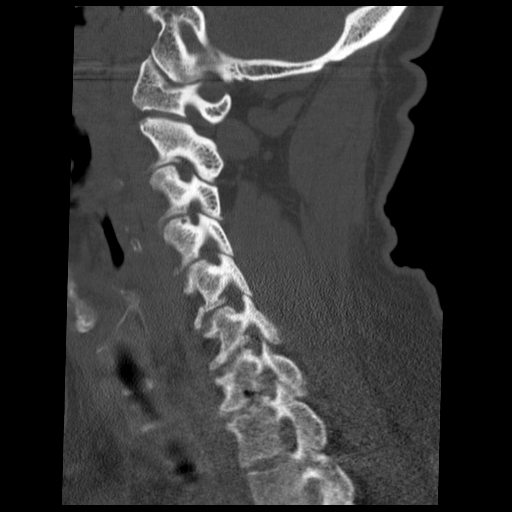

[16 of 33 positions shown; findings below may reference images not displayed]

FINDINGS: Reversal of the normal cervical lordosis is present with
apex at C6.  No spondylolisthesis is present.  Multilevel
degenerative disc disease.  Lung apices appear clear.  Paraspinal
soft tissues are within normal limits.  Atlantodental degenerative
disease is present.

C2-C3:  Mild left uncovertebral spurring and foraminal
encroachment.  Small central disc protrusion is partially
calcified.  There are mild central stenosis.

C3-C4:  Broad-based disc osteophyte complex is present with
calcified disc protrusion.  Mild central stenosis.  Left foraminal
stenosis potentially affecting the left C4 nerve.  Minimal right
foraminal encroachment.

C4-C5:  Shallow broad-based disc bulge which is partially
calcified.  Right greater than left foraminal stenosis associated
uncovertebral spurring and facet arthrosis.  Central canal appears
patent.

C5-C6:  Right greater than left foraminal stenosis associated with
uncovertebral spurring.  Shallow disc bulge.  Central canal patent.

C6-C7:  Shallow broad-based disc osteophyte complex with mild
central stenosis.  The study is technically degraded from C6-C7
inferiorly.  Bilateral foraminal stenosis is present secondary to
uncovertebral spurring, left greater than right potentially
affecting both C7 nerves.

C7-T1:  Severe bilateral uncovertebral spurring with left greater
than right foraminal stenosis potentially affecting both C8 nerves.
Central canal appears patent.
IMPRESSION: Multilevel moderate cervical spondylosis as described above.  Right-
sided foraminal stenosis most pronounced at C6-C7 and C7-T1
potentially affecting the right C7 and C8 nerves.

## 2013-01-10 ENCOUNTER — Other Ambulatory Visit (HOSPITAL_COMMUNITY): Payer: Self-pay | Admitting: Cardiology

## 2013-01-10 ENCOUNTER — Other Ambulatory Visit (HOSPITAL_COMMUNITY): Payer: Self-pay | Admitting: Adult Health

## 2013-01-10 DIAGNOSIS — K219 Gastro-esophageal reflux disease without esophagitis: Secondary | ICD-10-CM

## 2013-01-10 DIAGNOSIS — I5023 Acute on chronic systolic (congestive) heart failure: Secondary | ICD-10-CM

## 2013-01-10 MED ORDER — ESOMEPRAZOLE MAGNESIUM 40 MG PO CPDR: 40.0000 mg | DELAYED_RELEASE_CAPSULE | Freq: Every day | ORAL | Status: DC

## 2013-01-10 MED ORDER — METOLAZONE 2.5 MG PO TABS: 2.5000 mg | ORAL_TABLET | Freq: Every day | ORAL | Status: DC | PRN

## 2013-01-15 ENCOUNTER — Ambulatory Visit (HOSPITAL_COMMUNITY)
Admission: RE | Admit: 2013-01-15 | Discharge: 2013-01-15 | Disposition: A | Discharge status: Home / Self Care | Source: Ambulatory Visit | Attending: Internal Medicine | Admitting: Internal Medicine

## 2013-01-15 ENCOUNTER — Encounter (HOSPITAL_COMMUNITY): Payer: Self-pay

## 2013-01-15 VITALS — BP 98/82 | HR 106 | Wt 240.1 lb

## 2013-01-15 DIAGNOSIS — I5022 Chronic systolic (congestive) heart failure: Secondary | ICD-10-CM

## 2013-01-15 DIAGNOSIS — N189 Chronic kidney disease, unspecified: Secondary | ICD-10-CM | POA: Insufficient documentation

## 2013-01-15 DIAGNOSIS — K219 Gastro-esophageal reflux disease without esophagitis: Secondary | ICD-10-CM | POA: Insufficient documentation

## 2013-01-15 DIAGNOSIS — Z79899 Other long term (current) drug therapy: Secondary | ICD-10-CM | POA: Insufficient documentation

## 2013-01-15 DIAGNOSIS — I252 Old myocardial infarction: Secondary | ICD-10-CM | POA: Insufficient documentation

## 2013-01-15 DIAGNOSIS — I4891 Unspecified atrial fibrillation: Secondary | ICD-10-CM | POA: Insufficient documentation

## 2013-01-15 DIAGNOSIS — D869 Sarcoidosis, unspecified: Secondary | ICD-10-CM | POA: Insufficient documentation

## 2013-01-15 DIAGNOSIS — I4892 Unspecified atrial flutter: Secondary | ICD-10-CM

## 2013-01-15 DIAGNOSIS — I129 Hypertensive chronic kidney disease with stage 1 through stage 4 chronic kidney disease, or unspecified chronic kidney disease: Secondary | ICD-10-CM | POA: Insufficient documentation

## 2013-01-15 DIAGNOSIS — I509 Heart failure, unspecified: Secondary | ICD-10-CM | POA: Insufficient documentation

## 2013-01-15 DIAGNOSIS — I428 Other cardiomyopathies: Secondary | ICD-10-CM | POA: Insufficient documentation

## 2013-01-15 DIAGNOSIS — E7800 Pure hypercholesterolemia, unspecified: Secondary | ICD-10-CM | POA: Insufficient documentation

## 2013-01-15 DIAGNOSIS — I2510 Atherosclerotic heart disease of native coronary artery without angina pectoris: Secondary | ICD-10-CM | POA: Insufficient documentation

## 2013-01-15 DIAGNOSIS — Z95810 Presence of automatic (implantable) cardiac defibrillator: Secondary | ICD-10-CM | POA: Insufficient documentation

## 2013-01-15 NOTE — Patient Instructions (Signed)
Come tomorrow Demetrius Charity 1/15 at 9:15 to meet with Kennyth Lose, social worker  Your physician recommends that you schedule a follow-up appointment in: 1 month

## 2013-01-15 NOTE — Addendum Note (Signed)
Encounter addended by: Scarlette Calico, RN on: 01/15/2013  3:48 PM<BR>     Documentation filed: Patient Instructions Section

## 2013-01-15 NOTE — Progress Notes (Signed)
Patient ID: [name], male   DOB: [birth year], 63 y.o.   MRN: [id number]  HPI: [name] is a 63 y.o. gentlemen with severe HF due to NICM (EF 20-25%) with multiple hospital admissions for HF exacerbations. He also has history of ventricular tachycardia s/p [employer] ICD by Dr. Lovena Le, CKD: stage IV, baseline Cr ~2, and paroxysmal atrial arrhythmias on amiodarone as well as sarcoidosis and hypertension. Cath in 2008 by Dr. Terrence Dupont showed no CAD.Seen at [facility] and not felt to be a transplant candidate due to renal failure and lack of family support.  Milrinone initiated in January 2014 for low output. 03/01/12 milrinone increased 0.36mc/kg/min.   RHC 01/26/12  RA = 14  RV = 51/16/15  PA = 57/34 (43)  PCW = 26  Fick cardiac output/index = 4.2/1.8  Thermo CO/CI = 3.9/1.7  PVR = 4.0 [place]  O2 sat = 97%  PA sat = 57%, 58%  Ao Pressure (non-invasive): 116/80 (93)  SVR = 1504   ECHO 03/22/12 EF 15%   04/26/12 S/P Successful DC-CV for atrial fibrillation. Remains on amiodarone.   08/15/12 Creatinine 4.24 Potassium 3.3 09/19/12 Creatinine 3.98, Potassium 3.1, BUN 78 10/07/12 Creatinine 2.62, Potassium 3.4 10/18/12 Creatinine 3.3, K+3.1 11/08/12 Creatinine 2.15 K 4.0 11/25/12 Creatinine 2.93, K+ 3.5  Follow up; Remains on milrinone 0.375. Not feeling well since Monday. No energy. No appetite. Unable to walk more than a few feet. Weight coming down. Has not taken Arlyce Harman for 2 weeks d/t breast tenderness. + orthopnea and  palpitaitons since yesterday. Weight 235 lbs. For about a month has been seeing black stars.   Following low salt diet and drinking less than 2L a day.   He is not on an ace inhibitor due to elevated creatinine. Not on nitrates due to Viagra use.   ROS: All systems negative except as listed in HPI, PMH and Problem List.  Past Medical History  Diagnosis Date  . CHF (congestive heart failure)   . Sarcoidosis   . Cardiomyopathy, dilated, nonischemic     non ischemic by cath   . Acute on chronic systolic heart failure   . Automatic implantable cardiac defibrillator in situ   . Atrial fibrillation   . NSVT (nonsustained ventricular tachycardia)   . GERD (gastroesophageal reflux disease)   . Hypercholesteremia   . Myocardial infarction   . Shortness of breath   . Chronic kidney disease (CKD), stage III (moderate)   . Pacemaker   . Anginal pain   . Gout   . Hypertension     dr [name]  . Coronary artery disease    Current Outpatient Prescriptions  Medication Sig Dispense Refill  . allopurinol (ZYLOPRIM) 100 MG tablet Take 1 tablet (100 mg total) by mouth daily.  30 tablet  6  . amiodarone (PACERONE) 200 MG tablet Take 200 mg by mouth 2 (two) times daily.      . carvedilol (COREG) 3.125 MG tablet Take 3.125 mg by mouth 2 (two) times daily with a meal.      . esomeprazole (NEXIUM) 40 MG capsule Take 1 capsule (40 mg total) by mouth daily.  30 capsule  3  . fluticasone (FLONASE) 50 MCG/ACT nasal spray Place 2 sprays into both nostrils daily as needed for allergies.       Marland Kitchen LORazepam (ATIVAN) 0.5 MG tablet Take 1-2 tablets (0.5-1 mg total) by mouth 2 (two) times daily as needed for anxiety.  50 tablet  0  . metolazone (ZAROXOLYN) 2.5  MG tablet Take 1 tablet (2.5 mg total) by mouth daily as needed (for fluid retention).  10 tablet  1  . milrinone (PRIMACOR) 20 MG/100ML SOLN infusion Inject 37.3125 mcg/min into the vein continuous.  100 mL    . morphine (MS CONTIN) 15 MG 12 hr tablet Take 15 mg by mouth every 12 (twelve) hours.      . potassium chloride SA (K-DUR,KLOR-CON) 20 MEQ tablet Take 2 tablets (40 mEq total) by mouth 3 (three) times daily.  180 tablet  6  . sildenafil (VIAGRA) 100 MG tablet Take 1 tablet (100 mg total) by mouth daily as needed for erectile dysfunction.  4 tablet  1  . torsemide (DEMADEX) 20 MG tablet TAKE 2 TABLETS (40 MG TOTAL) BY MOUTH DAILY.  60 tablet  3  . apixaban (ELIQUIS) 5 MG TABS tablet Take 1 tablet (5 mg total) by mouth 2 (two)  times daily.  60 tablet  4   No current facility-administered medications for this encounter.   Filed Vitals:   01/15/13 1431  BP: 98/82  Pulse: 106  Weight: 240 lb 1.9 oz (108.918 kg)  SpO2: 97%   PHYSICAL EXAM: General: Farigued appearing. NAD   HEENT: normal  Neck: supple. JVP flat. Carotids 2+ bilat; no bruits. No lymphadenopathy or thryomegaly appreciated.  Cor: PMI laterally displaced. Tachy S1 S2 with +S3 and 2/6 HSM apex. R upper chest hickman site Lungs: clear Abdomen: soft, nontender, obese, +distended, No bruits or masses. Good bowel sounds.  Extremities: no cyanosis, clubbing, rash, no lower extremity edema  Neuro: alert & orientedx3, cranial nerves grossly intact. moves all 4 extremities w/o difficulty. Affect pleasant   ECG sinus tach with 1avb   ASSESSMENT & PLAN:   1) Chronic systolic HF: Nonischemic cardiomyopathy with EF 15% (03/2012) - 63 y.o. gentleman is unfortunately suffering from end stage HF on home milrinone. He continues to have frequent readmissions or ED visits d/t SOB and CP. He is actively followded by [facility] and gets weekly labs. - Currently NYHA IIIb symptoms and volume status stable. Was recently admitted to the hospital and CVP 4. Will continue torsemide 20 mg BID. Labs today with hospice.    - Continue milrinone 0.375 mcg through hickman cath - Remain on coreg 3.125 mg BID and Spiro 12.5 mg daily (for severe hypokalemia) - No ACEI with CKD - Patient would like to go to [facility] for the holidays. Will call Hospice and see if we can arrange for another Hospice to follow him while he is there. Biggest issue right now is symptom management and keeping the patient home and out of the hospital. If hospice in [place] will accept patient will provide them with number to call with questions. When he ends up in ED usually receives morphine and IV lasix with symptom management. It would be great if this could be done at home.   2) Atrial  fibrillation/flutter: Paroxysmal - He is in NSR today. Continue eliquis 5 mg BID and amiodarone 400 mg daily. He is not having any bleeding issues. - recent  3) CKD, stage IV - BMET today with hospice and will review and address.  Follow up 4-6 weeks Junie Bame B NP-C 2:36 PM  Patient seen and examined with Junie Bame, NP. We discussed all aspects of the encounter. I agree with the assessment and plan as stated above.   [name] unfortunately has end-stage HF with low-output symptoms despite milrinone support. We unfortunately have no more options, have  recommended [employer]. He wants to think about it some more. Will continue Home Hospice support.  Time spent 45 mins with 1/2 that time spent discussing above/  Benay Spice 3:42 PM

## 2013-01-16 ENCOUNTER — Encounter: Payer: Self-pay | Admitting: Licensed Clinical Social Worker

## 2013-01-16 NOTE — Progress Notes (Signed)
CSW met with patient in the clinic to provide support. [name] reports he met with Dr. Haroldine Laws yesterday in the clinic and had a discussion of admission to [facility]. Patient reports he is currently on home hospice and states that his symptoms of SOB, unable to walk any distance, numbing arm and constant chest pain have all increased and contributed to his decline. He stated that "my heart is not keeping up". He states that he "knew this was coming" and stated that [facility] place is "a place of peace". He feels he needs to have some closure with his 2 sons [name] (20yo) and [name] (63yo) before being admitted to [facility]. He lives with his Mom (69yo) and sister but reports his support system is his friend [name] and [name] both who provide both emotional support during this difficult time. He confirmed Living Will and POA documents. He states that he has "no remorse". "My life journey brought me to the [place]". He has a strong faith and support from his [employer] and states "I am good". [name] has an appointment at 1pm this afternoon to meet with his case manager from [facility] and plans to discuss options for admission to [facility]. He states he does not want to be admitted today as he has a few things to finish but is ready to pursue option. CSW provided supportive intervention and discussed end of life issues. Patient was very open and realistic and appears to be at peace with his current medical status and pending admission to [facility]. CSW provided name and number for any further needs that might arise. [name], [name]

## 2013-01-20 ENCOUNTER — Encounter: Payer: Self-pay | Admitting: Internal Medicine

## 2013-01-21 ENCOUNTER — Encounter: Payer: Self-pay | Admitting: Internal Medicine

## 2013-01-23 ENCOUNTER — Other Ambulatory Visit (HOSPITAL_COMMUNITY): Payer: Self-pay | Admitting: Adult Health

## 2013-01-23 ENCOUNTER — Telehealth (HOSPITAL_COMMUNITY): Payer: Self-pay

## 2013-01-23 MED ORDER — EPLERENONE 25 MG PO TABS: 12.5000 mg | ORAL_TABLET | Freq: Every day | ORAL | Status: DC

## 2013-01-23 NOTE — Telephone Encounter (Signed)
Mary from hospice and [facility] called regarding patient's weight gain of 7 pounds in 1 week.  Denies any other s/s (no swelling, coughing, or SOB).  Patient states he has not been taking his spiro for some time due to breast tenderness.  Patient did take PRN dose of metolazone this morning with little result of urinary output so far.  Dr. Haroldine Laws made aware, instructed to take one more PRN dose of metolazone tomorrow morning, DC'd spiro, and started patient on inspra 12.5mg  once daily.  Mary verbalized instructions, no questions or concerns.  Instructed to call us with update on weight with new orders.

## 2013-01-24 ENCOUNTER — Encounter (HOSPITAL_COMMUNITY): Payer: Self-pay | Admitting: Emergency Medicine

## 2013-01-24 ENCOUNTER — Emergency Department (HOSPITAL_COMMUNITY)
Admission: EM | Admit: 2013-01-24 | Discharge: 2013-01-25 | Disposition: A | Discharge status: Home / Self Care | Attending: Emergency Medicine | Admitting: Emergency Medicine

## 2013-01-24 DIAGNOSIS — Z8619 Personal history of other infectious and parasitic diseases: Secondary | ICD-10-CM | POA: Insufficient documentation

## 2013-01-24 DIAGNOSIS — I472 Ventricular tachycardia, unspecified: Secondary | ICD-10-CM | POA: Insufficient documentation

## 2013-01-24 DIAGNOSIS — S8010XA Contusion of unspecified lower leg, initial encounter: Secondary | ICD-10-CM | POA: Insufficient documentation

## 2013-01-24 DIAGNOSIS — I2510 Atherosclerotic heart disease of native coronary artery without angina pectoris: Secondary | ICD-10-CM | POA: Insufficient documentation

## 2013-01-24 DIAGNOSIS — R55 Syncope and collapse: Secondary | ICD-10-CM

## 2013-01-24 DIAGNOSIS — I5023 Acute on chronic systolic (congestive) heart failure: Secondary | ICD-10-CM | POA: Insufficient documentation

## 2013-01-24 DIAGNOSIS — S8012XA Contusion of left lower leg, initial encounter: Secondary | ICD-10-CM

## 2013-01-24 DIAGNOSIS — Z7902 Long term (current) use of antithrombotics/antiplatelets: Secondary | ICD-10-CM | POA: Insufficient documentation

## 2013-01-24 DIAGNOSIS — Y9301 Activity, walking, marching and hiking: Secondary | ICD-10-CM | POA: Insufficient documentation

## 2013-01-24 DIAGNOSIS — Z95810 Presence of automatic (implantable) cardiac defibrillator: Secondary | ICD-10-CM | POA: Insufficient documentation

## 2013-01-24 DIAGNOSIS — M109 Gout, unspecified: Secondary | ICD-10-CM | POA: Insufficient documentation

## 2013-01-24 DIAGNOSIS — N186 End stage renal disease: Secondary | ICD-10-CM | POA: Insufficient documentation

## 2013-01-24 DIAGNOSIS — R296 Repeated falls: Secondary | ICD-10-CM | POA: Insufficient documentation

## 2013-01-24 DIAGNOSIS — I428 Other cardiomyopathies: Secondary | ICD-10-CM | POA: Insufficient documentation

## 2013-01-24 DIAGNOSIS — E7800 Pure hypercholesterolemia, unspecified: Secondary | ICD-10-CM | POA: Insufficient documentation

## 2013-01-24 DIAGNOSIS — Z79899 Other long term (current) drug therapy: Secondary | ICD-10-CM | POA: Insufficient documentation

## 2013-01-24 DIAGNOSIS — K219 Gastro-esophageal reflux disease without esophagitis: Secondary | ICD-10-CM | POA: Insufficient documentation

## 2013-01-24 DIAGNOSIS — I252 Old myocardial infarction: Secondary | ICD-10-CM | POA: Insufficient documentation

## 2013-01-24 DIAGNOSIS — I4729 Other ventricular tachycardia: Secondary | ICD-10-CM | POA: Insufficient documentation

## 2013-01-24 DIAGNOSIS — Y92009 Unspecified place in unspecified non-institutional (private) residence as the place of occurrence of the external cause: Secondary | ICD-10-CM | POA: Insufficient documentation

## 2013-01-24 DIAGNOSIS — Z950 Presence of cardiac pacemaker: Secondary | ICD-10-CM | POA: Insufficient documentation

## 2013-01-24 DIAGNOSIS — I4891 Unspecified atrial fibrillation: Secondary | ICD-10-CM | POA: Insufficient documentation

## 2013-01-24 DIAGNOSIS — I120 Hypertensive chronic kidney disease with stage 5 chronic kidney disease or end stage renal disease: Secondary | ICD-10-CM | POA: Insufficient documentation

## 2013-01-24 NOTE — ED Provider Notes (Signed)
CSN: [id number]     Arrival date & time 01/24/13  2312 History   First MD Initiated Contact with Patient 01/24/13 2315     No chief complaint on file.  (Consider location/radiation/quality/duration/timing/severity/associated sxs/prior Treatment) HPI This patient is a 63 year old man under hospice care for end-stage cardiomyopathy and end-stage renal disease not being treated with dialysis.  He presents today after an episode of mechanical fall versus syncope. The patient said that he was walking over a threshold and then ended up on the floor. He said he cannot say for sure that not have syncope.But, he does not specifically recall tripping.   He denies head trauma. He has pain throughout his left leg from the knee down. Pain is aching and worse with movements of the left leg. Patient denies trauma or pain to any other region.   Of note, he is receiving continuous milrinone infusion via and right chest port.   Past Medical History  Diagnosis Date  . CHF (congestive heart failure)   . Sarcoidosis   . Cardiomyopathy, dilated, nonischemic     non ischemic by cath  . Acute on chronic systolic heart failure   . Automatic implantable cardiac defibrillator in situ   . Atrial fibrillation   . NSVT (nonsustained ventricular tachycardia)   . GERD (gastroesophageal reflux disease)   . Hypercholesteremia   . Myocardial infarction   . Shortness of breath   . Chronic kidney disease (CKD), stage III (moderate)   . Pacemaker   . Anginal pain   . Gout   . Hypertension     dr [name]  . Coronary artery disease    Past Surgical History  Procedure Laterality Date  . Back surgery  1987    Ruptured disk repair  . Pacemaker insertion      with ICD  . Tee without cardioversion  01/17/2011    Procedure: TRANSESOPHAGEAL ECHOCARDIOGRAM (TEE);  Surgeon: Birdie Riddle, MD;  Location: [employer];  Service: Cardiovascular;  Laterality: N/A;  . Cardioversion  01/17/2011    Procedure: CARDIOVERSION;   Surgeon: Birdie Riddle, MD;  Location: [facility];  Service: Cardiovascular;  Laterality: N/A;  . Cardiac catheterization    . Insert / replace / remove pacemaker    . Anterior cervical decomp/discectomy fusion  08/21/2011    Procedure: ANTERIOR CERVICAL DECOMPRESSION/DISCECTOMY FUSION 2 LEVELS;  Surgeon: Marybelle Killings, MD;  Location: [facility];  Service: Orthopedics;  Laterality: N/A;  C5-6, C6-7 Anterior Cervical Discectomy and Fusion, allograft, plate  . Tee without cardioversion N/A 02/16/2012    Procedure: TRANSESOPHAGEAL ECHOCARDIOGRAM (TEE);  Surgeon: Jolaine Artist, MD;  Location: [facility] ENDOSCOPY;  Service: Cardiovascular;  Laterality: N/A;  original case scheduled under his dad (who is deceased), rescheduled under correct mrn/pt/dob. [place]/dl  . Tee without cardioversion N/A 03/22/2012    Procedure: TRANSESOPHAGEAL ECHOCARDIOGRAM (TEE);  Surgeon: Lelon Perla, MD;  Location: [place];  Service: Cardiovascular;  Laterality: N/A;  . Cardioversion N/A 03/22/2012    Procedure: CARDIOVERSION;  Surgeon: Lelon Perla, MD;  Location: [facility] ENDOSCOPY;  Service: Cardiovascular;  Laterality: N/A;  . Cardioversion N/A 04/26/2012    Procedure: CARDIOVERSION;  Surgeon: Jolaine Artist, MD;  Location: [facility] ENDOSCOPY;  Service: Cardiovascular;  Laterality: N/A;  . Central venous catheter tunneled insertion single lumen  09/16/2012    right IJ  . Cardioversion N/A 11/08/2012    Procedure: CARDIOVERSION;  Surgeon: Jolaine Artist, MD;  Location: [facility] ENDOSCOPY;  Service: Cardiovascular;  Laterality: N/A;  Family History  Problem Relation Age of Onset  . Heart disease    . Heart failure    . Stroke    . Anesthesia problems Neg Hx   . Hypotension Neg Hx   . Malignant hyperthermia Neg Hx   . Pseudochol deficiency Neg Hx    History  Substance Use Topics  . Smoking status: Never Smoker   . Smokeless tobacco: Never Used  . Alcohol Use: No    Review of Systems Ten point review of symptoms  performed and is negative with the exception of symptoms noted above.   Allergies  Nitroglycerin  Home Medications   Current Outpatient Rx  Name  Route  Sig  Dispense  Refill  . allopurinol (ZYLOPRIM) 100 MG tablet   Oral   Take 1 tablet (100 mg total) by mouth daily.   30 tablet   6   . amiodarone (PACERONE) 200 MG tablet   Oral   Take 200 mg by mouth 2 (two) times daily.         Marland Kitchen apixaban (ELIQUIS) 5 MG TABS tablet   Oral   Take 1 tablet (5 mg total) by mouth 2 (two) times daily.   60 tablet   4     Please file dose change.   . carvedilol (COREG) 3.125 MG tablet   Oral   Take 3.125 mg by mouth 2 (two) times daily with a meal.         . eplerenone (INSPRA) 25 MG tablet   Oral   Take 0.5 tablets (12.5 mg total) by mouth daily.   15 tablet   6   . esomeprazole (NEXIUM) 40 MG capsule   Oral   Take 1 capsule (40 mg total) by mouth daily.   30 capsule   3   . fluticasone (FLONASE) 50 MCG/ACT nasal spray   Each Nare   Place 2 sprays into both nostrils daily as needed for allergies.          Marland Kitchen LORazepam (ATIVAN) 0.5 MG tablet   Oral   Take 1-2 tablets (0.5-1 mg total) by mouth 2 (two) times daily as needed for anxiety.   50 tablet   0   . metolazone (ZAROXOLYN) 2.5 MG tablet   Oral   Take 1 tablet (2.5 mg total) by mouth daily as needed (for fluid retention).   10 tablet   1   . milrinone (PRIMACOR) 20 MG/100ML SOLN infusion   Intravenous   Inject 37.3125 mcg/min into the vein continuous.   100 mL      . morphine (MS CONTIN) 15 MG 12 hr tablet   Oral   Take 15 mg by mouth every 12 (twelve) hours.         . potassium chloride SA (K-DUR,KLOR-CON) 20 MEQ tablet   Oral   Take 2 tablets (40 mEq total) by mouth 3 (three) times daily.   180 tablet   6     Please file dose change patient has medication   . sildenafil (VIAGRA) 100 MG tablet   Oral   Take 1 tablet (100 mg total) by mouth daily as needed for erectile dysfunction.   4 tablet    1   . torsemide (DEMADEX) 20 MG tablet      TAKE 2 TABLETS (40 MG TOTAL) BY MOUTH DAILY.   60 tablet   3    There were no vitals taken for this visit. Physical Exam Gen: well developed and well nourished appearing  Head: NCAT Eyes: PERL, EOMI Nose: no epistaixis or rhinorrhea Mouth/throat: mucosa is moist and pink Neck: supple, no stridor Lungs: CTA B, no wheezing, rhonchi or rales, no C-spine tenderness CV: regular rate and rythm, good distal pulses. Chest wall: port right upper chest with infusion line - site appears normal Abd: soft, notender, nondistended Back: No midline tenderness to palpation Skin: warm and dry Ext: bilateral pretibial brawny edema which is symmetric, normal to inspection, no deformity appreciated. However, there is significant tenderness to palpation over the entire right lower leg, right ankle and foot. DP pulses are detectable with Doppler and are symmetric bilaterally. Neuro: CN ii-xii grossly intact, no focal deficits Psyche; normal affect,  calm and cooperative.  ED Course  Procedures (including critical care time) Labs Review Labs Reviewed - No data to display  EKG: nsr, no acute ischemic changes, prolonged PR interval, , nonspecific IVCD with left axis deviation, nonspecific T-wave abnormalities in the lateral leads.   MDM  This is a Home Hospice patient who is s/p mechanical fall v. Syncope and has an ICD. He has left lower leg and foot pain following trauma. We will limit the patient's medical work up as he wishes are to focus of palliative care. Plain films ordered to rule out fx. Symptomatic pain management. Anticipate discharge.     Elyn Peers, MD 01/25/13 [phone]

## 2013-01-24 NOTE — ED Notes (Signed)
Per EMS pt came from home where he had an unwitnessed fall with injuries to left knee, left ankle and left foot. EMS gave 50 mcg of fentanyl spray in right nare and 50 mcg of fentanyl spray to left nare. Pt is on hospice for heart failure and renal failure. Pt AAOx4 upon arrival. Rates pain 5/10 to left leg. VSS.

## 2013-01-25 ENCOUNTER — Emergency Department (HOSPITAL_COMMUNITY)

## 2013-01-25 LAB — GLUCOSE, CAPILLARY: GLUCOSE-CAPILLARY: 108 mg/dL — AB (ref 70–99)

## 2013-01-25 MED ORDER — OXYCODONE-ACETAMINOPHEN 5-325 MG PO TABS
2.0000 | ORAL_TABLET | Freq: Once | ORAL | Status: AC
  Administered 2013-01-25: 2 via ORAL
  Filled 2013-01-25: qty 2

## 2013-01-25 NOTE — ED Notes (Addendum)
Lab at [facility], "difficult stick".

## 2013-01-25 NOTE — ED Notes (Signed)
Lab unable to get blood, pt to xray, alert, NAD, calm, interactive.

## 2013-01-25 NOTE — ED Notes (Signed)
Ortho tech paged  

## 2013-01-25 NOTE — ED Notes (Signed)
Pt back from x-ray, hooked back up on monitor.

## 2013-01-25 NOTE — ED Notes (Signed)
Ortho tech en route ED.

## 2013-01-29 IMAGING — CR DG CHEST 1V PORT
1 series · 1 of 1 positions shown · non-contrast
Comparison: 07/31/2011

CLINICAL DATA: Chest pain and shortness of breath.  History of
hypertension, CHF.

PORTABLE CHEST - 1 VIEW

[AP]
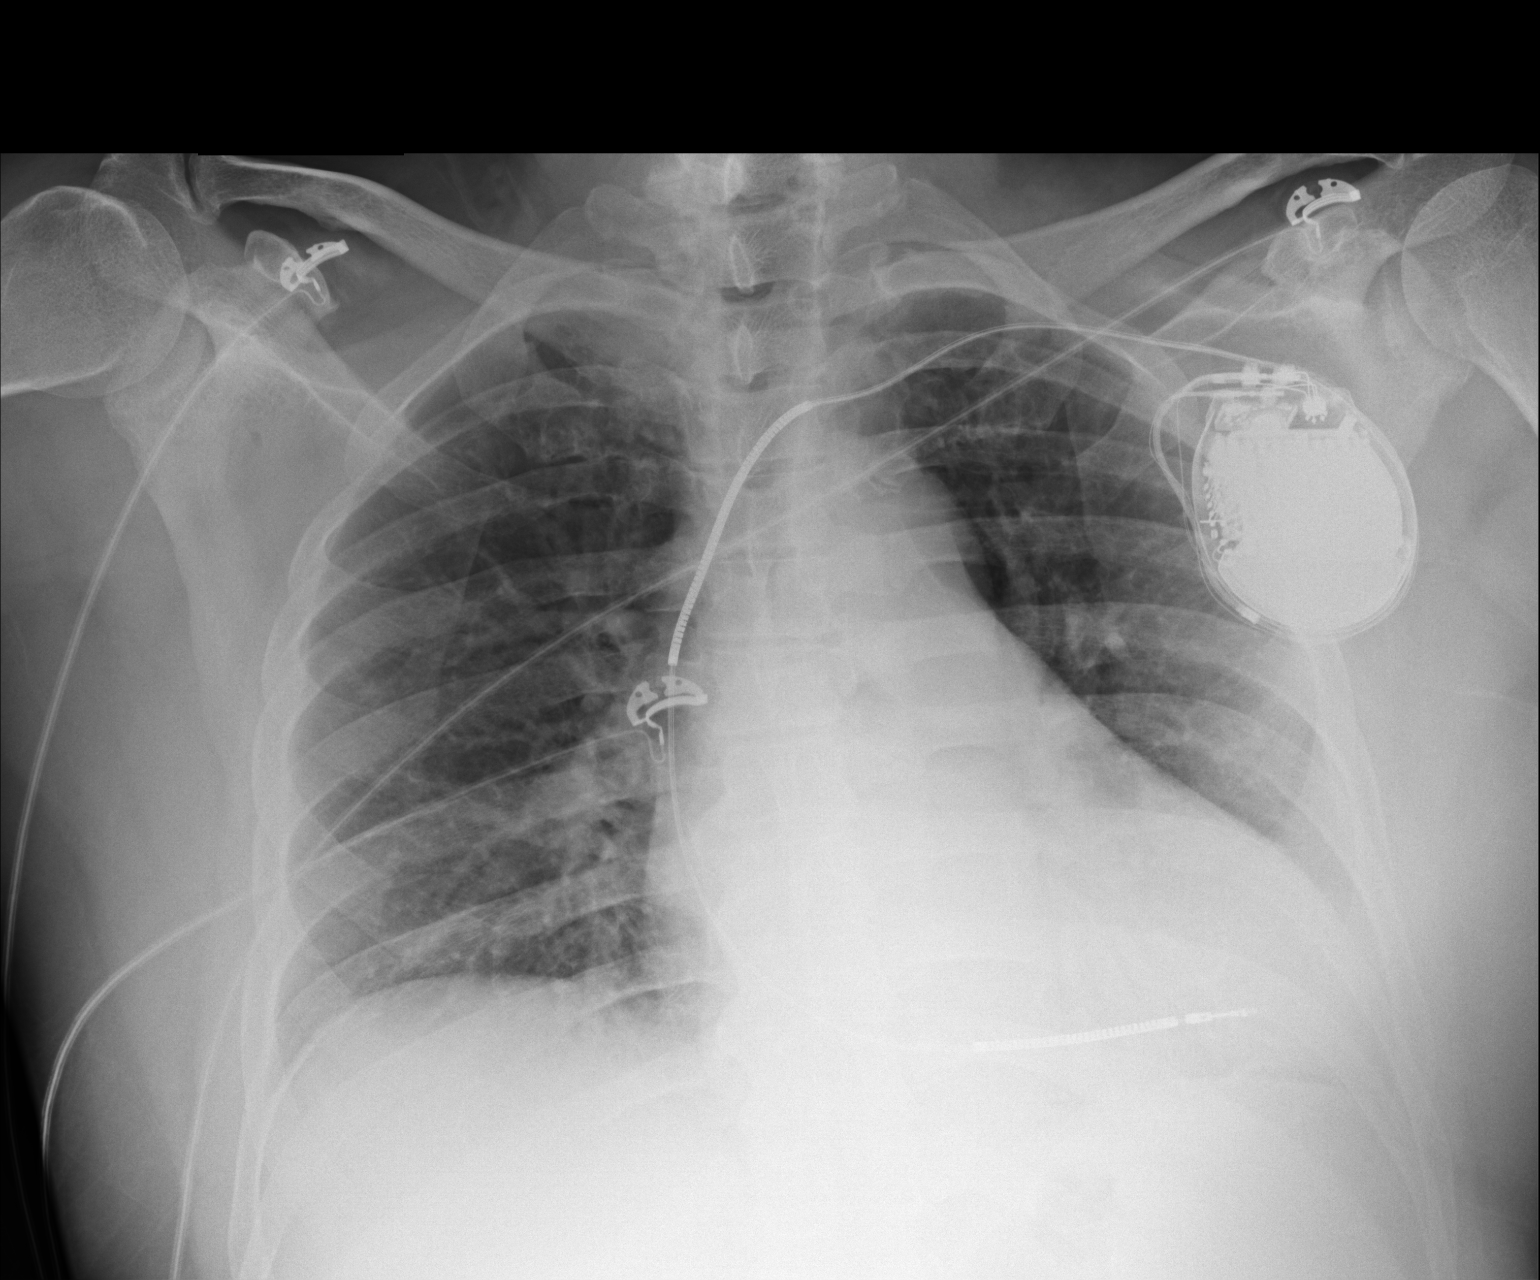

[1 of 1 positions shown; findings below may reference images not displayed]

FINDINGS: Patient has left-sided AICD, lead to the right ventricle.
Heart is enlarged.  There are no focal consolidations or pleural
effusions.  No overt edema. Visualized osseous structures have a
normal appearance.
IMPRESSION: 1.  Cardiomegaly.
2.  No overt edema.

## 2013-02-05 ENCOUNTER — Encounter (HOSPITAL_COMMUNITY): Payer: Self-pay

## 2013-02-05 ENCOUNTER — Ambulatory Visit (HOSPITAL_COMMUNITY)
Admission: RE | Admit: 2013-02-05 | Discharge: 2013-02-05 | Disposition: A | Discharge status: Home / Self Care | Source: Ambulatory Visit | Attending: Internal Medicine | Admitting: Internal Medicine

## 2013-02-05 ENCOUNTER — Encounter: Payer: Self-pay | Admitting: Licensed Clinical Social Worker

## 2013-02-05 VITALS — BP 100/70 | HR 99 | Wt 247.1 lb

## 2013-02-05 DIAGNOSIS — Z95810 Presence of automatic (implantable) cardiac defibrillator: Secondary | ICD-10-CM | POA: Insufficient documentation

## 2013-02-05 DIAGNOSIS — I252 Old myocardial infarction: Secondary | ICD-10-CM | POA: Insufficient documentation

## 2013-02-05 DIAGNOSIS — I428 Other cardiomyopathies: Secondary | ICD-10-CM | POA: Insufficient documentation

## 2013-02-05 DIAGNOSIS — Z79899 Other long term (current) drug therapy: Secondary | ICD-10-CM | POA: Insufficient documentation

## 2013-02-05 DIAGNOSIS — I5032 Chronic diastolic (congestive) heart failure: Secondary | ICD-10-CM | POA: Insufficient documentation

## 2013-02-05 DIAGNOSIS — N184 Chronic kidney disease, stage 4 (severe): Secondary | ICD-10-CM | POA: Insufficient documentation

## 2013-02-05 DIAGNOSIS — I509 Heart failure, unspecified: Secondary | ICD-10-CM | POA: Insufficient documentation

## 2013-02-05 DIAGNOSIS — M109 Gout, unspecified: Secondary | ICD-10-CM | POA: Insufficient documentation

## 2013-02-05 DIAGNOSIS — D869 Sarcoidosis, unspecified: Secondary | ICD-10-CM | POA: Insufficient documentation

## 2013-02-05 DIAGNOSIS — N189 Chronic kidney disease, unspecified: Secondary | ICD-10-CM

## 2013-02-05 DIAGNOSIS — I4892 Unspecified atrial flutter: Secondary | ICD-10-CM | POA: Insufficient documentation

## 2013-02-05 DIAGNOSIS — I5022 Chronic systolic (congestive) heart failure: Secondary | ICD-10-CM

## 2013-02-05 DIAGNOSIS — I129 Hypertensive chronic kidney disease with stage 1 through stage 4 chronic kidney disease, or unspecified chronic kidney disease: Secondary | ICD-10-CM | POA: Insufficient documentation

## 2013-02-05 DIAGNOSIS — I4891 Unspecified atrial fibrillation: Secondary | ICD-10-CM | POA: Insufficient documentation

## 2013-02-05 DIAGNOSIS — I2510 Atherosclerotic heart disease of native coronary artery without angina pectoris: Secondary | ICD-10-CM | POA: Insufficient documentation

## 2013-02-05 DIAGNOSIS — K219 Gastro-esophageal reflux disease without esophagitis: Secondary | ICD-10-CM | POA: Insufficient documentation

## 2013-02-05 NOTE — Patient Instructions (Signed)
Follow up in 6 weeks  Do the following things EVERYDAY: 1) Weigh yourself in the morning before breakfast. Write it down and keep it in a log. 2) Take your medicines as prescribed 3) Eat low salt foods-Limit salt (sodium) to 2000 mg per day.  4) Stay as active as you can everyday 5) Limit all fluids for the day to less than 2 liters 

## 2013-02-05 NOTE — Progress Notes (Signed)
CSW met with patient in the clinic to follow up on last visit.Patient reports he is feeling well and "I am happy today". Patient spoke at length about his disease process and aware that he is declining. He stated that he notices that he is able to do less and less and sometimes just get dressed is all he can do for the day. He spoke about hospice and feels that going to [facility] is inevitable but "not my time yet". Patient appears to have a good understanding of his disease process and options for care in the future. CSW will continue to be available as needed. Raquel Sarna, [place]

## 2013-02-05 NOTE — Progress Notes (Signed)
Patient ID: JOSELITO FIELDHOUSE, male   DOB: 1950-07-04, 63 y.o.   MRN: [id number] [facility] [place]   HPI: Mr. Keelan is a 63 y.o. gentlemen with severe HF due to NICM (EF 20-25%) with multiple hospital admissions for HF exacerbations. He also has history of ventricular tachycardia s/p St Jude ICD by Dr. Lovena Le, CKD: stage IV, baseline Cr ~2, and paroxysmal atrial arrhythmias on amiodarone as well as sarcoidosis and hypertension. Cath in 2008 by Dr. Terrence Dupont showed no CAD.Seen at [facility] and not felt to be a transplant candidate due to renal failure and lack of family support.  Milrinone initiated in January 2014 for low output. 03/01/12 milrinone increased 0.327mc/kg/min.   RHC 01/26/12  RA = 14  RV = 51/16/15  PA = 57/34 (43)  PCW = 26  Fick cardiac output/index = 4.2/1.8  Thermo CO/CI = 3.9/1.7  PVR = 4.0 Woods  O2 sat = 97%  PA sat = 57%, 58%  Ao Pressure (non-invasive): 116/80 (93)  SVR = 1504   ECHO 03/22/12 EF 15%   04/26/12 S/P Successful DC-CV for atrial fibrillation. Remains on amiodarone.   08/15/12 Creatinine 4.24 Potassium 3.3 09/19/12 Creatinine 3.98, Potassium 3.1, BUN 78 10/07/12 Creatinine 2.62, Potassium 3.4 10/18/12 Creatinine 3.3, K+3.1 11/08/12 Creatinine 2.15 K 4.0 11/25/12 Creatinine 2.93, K+ 3.5  He returns for follow up. One week ago he tripped and fell at home. He broke his L great toe, 4th and 5th toe. Overall he says he has been having much better days. Denies SOB/PND. Sleeps in hospital bed with HOB elevated. Weight at home 239-241 pounds. He did not take diuretics this am. Says he has a good urine output with torsemide. Appetite fair. He continues on Milrinone 0.375 mcg. Followed by [facility].    He is not on an ace inhibitor due to elevated creatinine. Not on nitrates due to Viagra use.   ROS: All systems negative except as listed in HPI, PMH and Problem List.  Past Medical History  Diagnosis Date  . CHF (congestive heart failure)   .  Sarcoidosis   . Cardiomyopathy, dilated, nonischemic     non ischemic by cath  . Acute on chronic systolic heart failure   . Automatic implantable cardiac defibrillator in situ   . Atrial fibrillation   . NSVT (nonsustained ventricular tachycardia)   . GERD (gastroesophageal reflux disease)   . Hypercholesteremia   . Myocardial infarction   . Shortness of breath   . Chronic kidney disease (CKD), stage III (moderate)   . Pacemaker   . Anginal pain   . Gout   . Hypertension     dr [name]  . Coronary artery disease    Current Outpatient Prescriptions  Medication Sig Dispense Refill  . allopurinol (ZYLOPRIM) 100 MG tablet Take 1 tablet (100 mg total) by mouth daily.  30 tablet  6  . amiodarone (PACERONE) 200 MG tablet Take 200 mg by mouth 2 (two) times daily.      Marland Kitchen apixaban (ELIQUIS) 5 MG TABS tablet Take 1 tablet (5 mg total) by mouth 2 (two) times daily.  60 tablet  4  . carvedilol (COREG) 3.125 MG tablet Take 3.125 mg by mouth 2 (two) times daily with a meal.      . eplerenone (INSPRA) 25 MG tablet Take 0.5 tablets (12.5 mg total) by mouth daily.  15 tablet  6  . esomeprazole (NEXIUM) 40 MG capsule Take 1 capsule (40 mg total) by mouth daily.  [place]  capsule  3  . fluticasone (FLONASE) 50 MCG/ACT nasal spray Place 2 sprays into both nostrils daily as needed for allergies.       Marland Kitchen LORazepam (ATIVAN) 0.5 MG tablet Take 1-2 tablets (0.5-1 mg total) by mouth 2 (two) times daily as needed for anxiety.  50 tablet  0  . Melatonin 3 MG CAPS Take by mouth.      . metolazone (ZAROXOLYN) 2.5 MG tablet Take 1 tablet (2.5 mg total) by mouth daily as needed (for fluid retention).  10 tablet  1  . milrinone (PRIMACOR) 20 MG/100ML SOLN infusion Inject 37.3125 mcg/min into the vein continuous.  100 mL    . morphine (MS CONTIN) 15 MG 12 hr tablet Take 15 mg by mouth every 12 (twelve) hours as needed. For pain      . potassium chloride SA (K-DUR,KLOR-CON) 20 MEQ tablet Take 60 mEq by mouth 2 (two) times  daily.      . promethazine (PHENERGAN) 25 MG tablet Take 25 mg by mouth every 6 (six) hours as needed for nausea or vomiting.      . senna-docusate (SENOKOT-S) 8.6-50 MG per tablet Take 1-2 tablets by mouth 2 (two) times daily as needed for mild constipation or moderate constipation.      . sildenafil (VIAGRA) 100 MG tablet Take 1 tablet (100 mg total) by mouth daily as needed for erectile dysfunction.  4 tablet  1  . torsemide (DEMADEX) 20 MG tablet Take 20-40 mg by mouth 2 (two) times daily. Take 2 tablets (up to 40 mg)in the morning and take 1 tablet (20mg ) in the evening      . zolpidem (AMBIEN) 5 MG tablet Take 5 mg by mouth at bedtime as needed for sleep.       No current facility-administered medications for this encounter.   Filed Vitals:   02/05/13 0929  BP: 100/70  Pulse: 99  Weight: 247 lb 1.9 oz (112.093 kg)  SpO2: 97%   PHYSICAL EXAM: General: NAD.   HEENT: normal  Neck: supple. JVP flat. Carotids 2+ bilat; no bruits. No lymphadenopathy or thryomegaly appreciated.  Cor: PMI laterally displaced. Regular S1 S2 with +S3 and 2/6 HSM apex. R upper chest hickman site with Milrinone infusing.  Lungs: clear Abdomen: soft, nontender, obese, +distended, No bruits or masses. Good bowel sounds.  Extremities: no cyanosis, clubbing, rash, no lower extremity edema L foot orthotic shoe. Neuro: alert & orientedx3, cranial nerves grossly intact. moves all 4 extremities w/o difficulty. Affect pleasant     ASSESSMENT & PLAN:   1) Chronic systolic HF: Nonischemic cardiomyopathy with EF 15% (03/2012). On home Milrinone at 0.375 mcg  -  He is actively followed by [place] and weekly lab work.  - Currently NYHA IIIb symptoms and having more good days than bad.  Volume status stable despite weight gain. Continue torsemide 40 mg in am 20 mg in pm. Tolerating Inspra 12.5 mg daily. Unable to tolerate Spironolactone due to gynecomastia. He is no longer on Imdur due to viagra use.  -  Continue milrinone 0.375 mcg through hickman cath - Remain on coreg 3.125 mg BID- No ACEI with CKD -Reinforced limiting fluid intake to < 2 liters per day and low sat food choices.  Follow up with HF SW today.  2) Atrial fibrillation/flutter: Paroxysmal -.Rate controlled.  Continue eliquis 5 mg BID and amiodarone 400 mg daily. He is not having any bleeding issues. 3) CKD, stage IV - BMET weekly per Hospice.  Follow up 4-6 weeks Britton Bera NP-C 9:44 AM

## 2013-02-17 ENCOUNTER — Telehealth: Payer: Self-pay | Admitting: Nurse Practitioner

## 2013-02-17 NOTE — Telephone Encounter (Signed)
Received phone call from [facility] with Hospice - [phone].  Concerned about patient's central line. The vein is quite firm. Not red. Not draining. Dressing looks ok. He is having "nerve pain" down his right side - which he has had before.   CHF clinic has closed.   Will try to talk with Dr. Haroldine Laws - he feels this is probably superficial thrombophlebitis and has advised to use warm compresses. If his symptoms progress, he will need to go to the ER.  I have relayed this information to [facility] with Hospice.  Burtis Junes, RN, [facility] [address] [place], [place]  [phone]

## 2013-02-20 ENCOUNTER — Encounter: Payer: Self-pay | Admitting: Internal Medicine

## 2013-03-10 ENCOUNTER — Encounter: Payer: Self-pay | Admitting: Internal Medicine

## 2013-03-11 ENCOUNTER — Telehealth (HOSPITAL_COMMUNITY): Payer: Self-pay

## 2013-03-11 NOTE — Telephone Encounter (Signed)
Patient called concerning most recent lab values, Informed of low potassium.  Instructed to hold diuretics for today and tomorrow, and resumes Thursday as usual.  Patient has appointment with our clinic next week, BMET added on to reevaluate potassium levels. [name] Pain

## 2013-03-13 IMAGING — CR DG CHEST 1V PORT
1 series · 1 of 1 positions shown · non-contrast
Comparison: Chest radiograph 08/18/2011

CLINICAL DATA: Loss of consciousness, irregular heart B

PORTABLE CHEST - 1 VIEW

[AP]
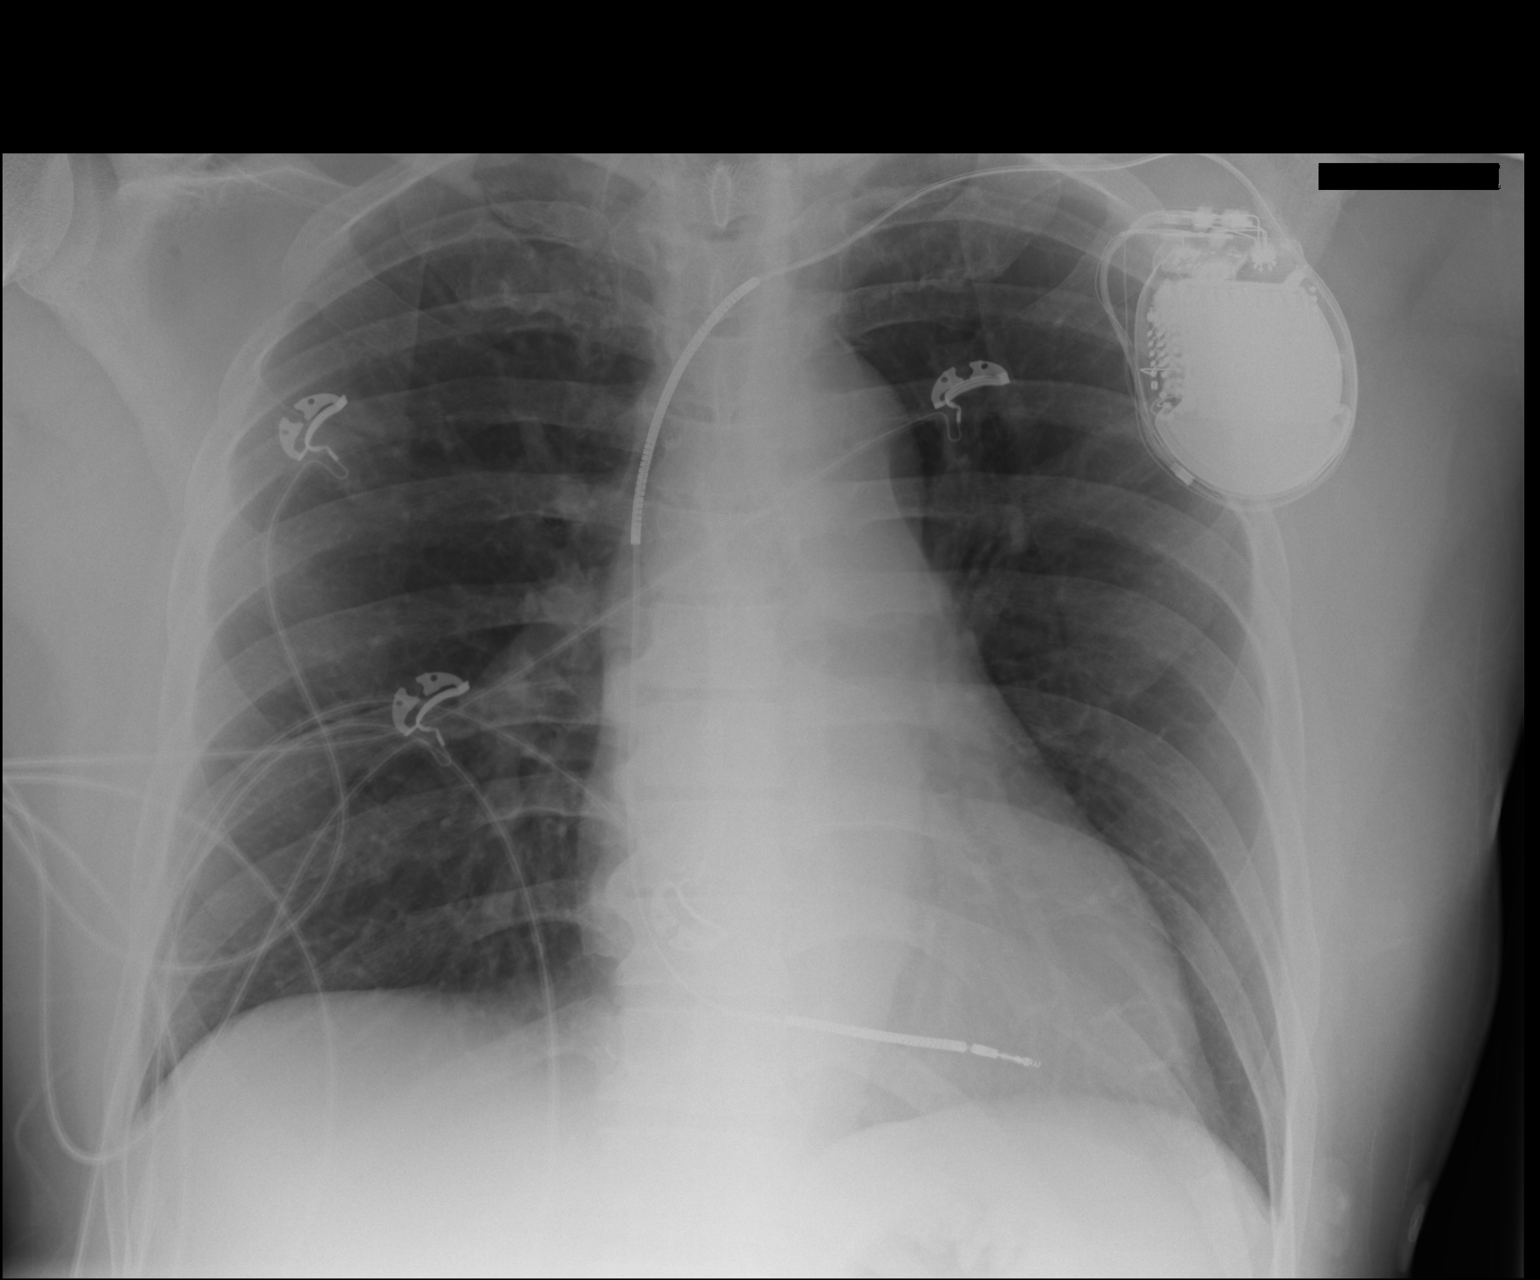

[1 of 1 positions shown; findings below may reference images not displayed]

FINDINGS: Left-sided pacemaker with single obtuse lead overlies
stable heart silhouette.  No effusion, infiltrate, or pneumothorax.
Anterior cervical fusion noted.
IMPRESSION: No acute cardiopulmonary process.

## 2013-03-19 ENCOUNTER — Encounter (HOSPITAL_COMMUNITY): Payer: Self-pay

## 2013-03-19 ENCOUNTER — Ambulatory Visit (HOSPITAL_COMMUNITY)
Admission: RE | Admit: 2013-03-19 | Discharge: 2013-03-19 | Disposition: A | Discharge status: Home / Self Care | Payer: PRIVATE HEALTH INSURANCE | Source: Ambulatory Visit | Attending: Internal Medicine | Admitting: Internal Medicine

## 2013-03-19 ENCOUNTER — Encounter: Payer: Self-pay | Admitting: Internal Medicine

## 2013-03-19 VITALS — BP 124/88 | HR 75 | Resp 18 | Wt 254.2 lb

## 2013-03-19 DIAGNOSIS — I509 Heart failure, unspecified: Secondary | ICD-10-CM

## 2013-03-19 DIAGNOSIS — E876 Hypokalemia: Secondary | ICD-10-CM | POA: Diagnosis not present

## 2013-03-19 DIAGNOSIS — I5022 Chronic systolic (congestive) heart failure: Secondary | ICD-10-CM | POA: Diagnosis not present

## 2013-03-19 DIAGNOSIS — N182 Chronic kidney disease, stage 2 (mild): Secondary | ICD-10-CM | POA: Insufficient documentation

## 2013-03-19 DIAGNOSIS — I4891 Unspecified atrial fibrillation: Secondary | ICD-10-CM | POA: Insufficient documentation

## 2013-03-19 NOTE — Patient Instructions (Signed)
Take an extra torsemide today along with extra potassium  Follow up in 6-8 weeks.  Call any issues  .Do the following things EVERYDAY: 1) Weigh yourself in the morning before breakfast. Write it down and keep it in a log. 2) Take your medicines as prescribed 3) Eat low salt foods-Limit salt (sodium) to 2000 mg per day.  4) Stay as active as you can everyday Limit all fluids for the day to less than 2 liters

## 2013-03-19 NOTE — Progress Notes (Signed)
Patient ID: [name], male   DOB: 1950/08/14, 63 y.o.   MRN: [id number] [facility] [place]   HPI: [name] is a 63 y.o. gentlemen with severe HF due to NICM (EF 20-25%) with multiple hospital admissions for HF exacerbations. He also has history of ventricular tachycardia s/p St Jude ICD by [name], CKD: stage IV, and paroxysmal atrial arrhythmias on amiodarone as well as sarcoidosis and hypertension. Cath in 2008 by [name] showed no CAD.Seen at [facility] and not felt to be a transplant candidate due to renal failure and lack of family support.  Milrinone initiated in January 2014 for low output. 03/01/12 milrinone increased 0.325mc/kg/min.   RHC 01/26/12  RA = 14  RV = 51/16/15  PA = 57/34 (43)  PCW = 26  Fick cardiac output/index = 4.2/1.8  Thermo CO/CI = 3.9/1.7  PVR = 4.0 Woods  O2 sat = 97%  PA sat = 57%, 58%  Ao Pressure (non-invasive): 116/80 (93)  SVR = 1504   ECHO 03/22/12 EF 15%   04/26/12 S/P Successful DC-CV for atrial fibrillation. Remains on amiodarone.   Follow up: Since last visit had nerve pain at PICC line and used warm compresses which it improved. Reports being weak all the time. Denies SOB, PND, or edema. Reports shooting pain in chest all day every day and with palpitations and now is on oxycodone which helps. Occassional palpitations. Reports he does not sleep well and Hospice prescribed him meds to try and help, but not helping. No appetite. Weight at home 246-248 lbs. Has hospital bed at home.   He is not on an ace inhibitor due to elevated creatinine. Not on nitrates due to Viagra use.   08/15/12 Creatinine 4.24 Potassium 3.3 09/19/12 Creatinine 3.98, Potassium 3.1, BUN 78 10/07/12 Creatinine 2.62, Potassium 3.4 10/18/12 Creatinine 3.3, K+3.1 11/08/12 Creatinine 2.15 K 4.0 11/25/12 Creatinine 2.93, K+ 3.5 03/10/13: K+ 2.9, Creatinine 2.63  ROS: All systems negative except as listed in HPI, PMH and Problem List.  Past Medical History  Diagnosis  Date  . CHF (congestive heart failure)   . Sarcoidosis   . Cardiomyopathy, dilated, nonischemic     non ischemic by cath  . Acute on chronic systolic heart failure   . Automatic implantable cardiac defibrillator in situ   . Atrial fibrillation   . NSVT (nonsustained ventricular tachycardia)   . GERD (gastroesophageal reflux disease)   . Hypercholesteremia   . Myocardial infarction   . Shortness of breath   . Chronic kidney disease (CKD), stage III (moderate)   . Pacemaker   . Anginal pain   . Gout   . Hypertension     [name]  . Coronary artery disease    Current Outpatient Prescriptions  Medication Sig Dispense Refill  . allopurinol (ZYLOPRIM) 100 MG tablet Take 1 tablet (100 mg total) by mouth daily.  30 tablet  6  . amiodarone (PACERONE) 200 MG tablet Take 200 mg by mouth 2 (two) times daily.      Marland Kitchen apixaban (ELIQUIS) 5 MG TABS tablet Take 1 tablet (5 mg total) by mouth 2 (two) times daily.  60 tablet  4  . carvedilol (COREG) 3.125 MG tablet Take 3.125 mg by mouth 2 (two) times daily with a meal.      . eplerenone (INSPRA) 25 MG tablet Take 0.5 tablets (12.5 mg total) by mouth daily.  15 tablet  6  . esomeprazole (NEXIUM) 40 MG capsule Take 1 capsule (40 mg total) by  mouth daily.  30 capsule  3  . fluticasone (FLONASE) 50 MCG/ACT nasal spray Place 2 sprays into both nostrils daily as needed for allergies.       Marland Kitchen LORazepam (ATIVAN) 0.5 MG tablet Take 1-2 tablets (0.5-1 mg total) by mouth 2 (two) times daily as needed for anxiety.  50 tablet  0  . Melatonin 3 MG CAPS Take by mouth.      . metolazone (ZAROXOLYN) 2.5 MG tablet Take 1 tablet (2.5 mg total) by mouth daily as needed (for fluid retention).  10 tablet  1  . milrinone (PRIMACOR) 20 MG/100ML SOLN infusion Inject 37.3125 mcg/min into the vein continuous.  100 mL    . oxycodone (OXY-IR) 5 MG capsule Take 10 mg by mouth every 4 (four) hours as needed.      . potassium chloride SA (K-DUR,KLOR-CON) 20 MEQ tablet Take 60  mEq by mouth 2 (two) times daily.      . promethazine (PHENERGAN) 25 MG tablet Take 25 mg by mouth every 6 (six) hours as needed for nausea or vomiting.      . senna-docusate (SENOKOT-S) 8.6-50 MG per tablet Take 1-2 tablets by mouth 2 (two) times daily as needed for mild constipation or moderate constipation.      . sildenafil (VIAGRA) 100 MG tablet Take 1 tablet (100 mg total) by mouth daily as needed for erectile dysfunction.  4 tablet  1  . zolpidem (AMBIEN) 5 MG tablet Take 5 mg by mouth at bedtime as needed for sleep.       No current facility-administered medications for this encounter.   Filed Vitals:   03/19/13 0926  Weight: 254 lb 4 oz (115.327 kg)    PHYSICAL EXAM: General: NAD; chronically ill appearing HEENT: normal  Neck: supple. JVP 8-9. Carotids 2+ bilat; no bruits. No lymphadenopathy or thryomegaly appreciated.  Cor: PMI laterally displaced. Regular S1 S2 with +S3 and 2/6 HSM apex. R upper chest hickman site with Milrinone infusing.  Lungs: clear Abdomen: soft, nontender, obese, +distended, No bruits or masses. Good bowel sounds.  Extremities: no cyanosis, clubbing, rash, no lower extremity edema  Neuro: alert & orientedx3, cranial nerves grossly intact. moves all 4 extremities w/o difficulty. Affect pleasant     ASSESSMENT & PLAN:   1) Chronic systolic HF: NICM, EF 58% (03/2012). On home Milrinone at 0.375 mcg  -  He is actively followed by [facility] and weekly lab work.  - Currently NYHA IIIb/IV symptoms. Volume status slightly increased, will have him take an extra torsemide today with extra potassium. He will then continue toresmide 40 mg  - Tolerating Inspra 12.5 mg daily. Unable to tolerate Spironolactone due to gynecomastia. He is no longer on Imdur due to viagra use.  - Continue milrinone 0.375 mcg through hickman cath - Remain on coreg 3.125 mg BID will not titrate with milrinone and increased fatigue - No ACEI with CKD - Reinforced the need and  importance of daily weights, a low sodium diet, and fluid restriction (less than 2 L a day). Instructed to call the HF clinic if weight increases more than 3 lbs overnight or 5 lbs in a week.  2) Atrial fibrillation/flutter: Paroxysmal - Appears to be in NSR today. Continue eliquis 5 mg BID and amiodarone 400 mg daily. He is not having any bleeding issues. Need to order TSH and LFTs next visit.  3) CKD, stage IV - BMET weekly per Hospice.   F/U 6-8 weeks. Patient is talking to  Hospice about some respite care and possibly going to facility to get some rest for a couple of days. If this is covered I highly recommend this option and think he could benefit from some time away from home.   Junie Bame B NP-C 9:30 AM

## 2013-03-20 IMAGING — CR DG CHEST 2V
3 series · 3 of 3 positions shown · non-contrast
Comparison: 09/30/2011.

CLINICAL DATA: Chest pain and shortness of breath.  History of
sarcoidosis.

CHEST - 2 VIEW

[x chest ap (1 of 2)]
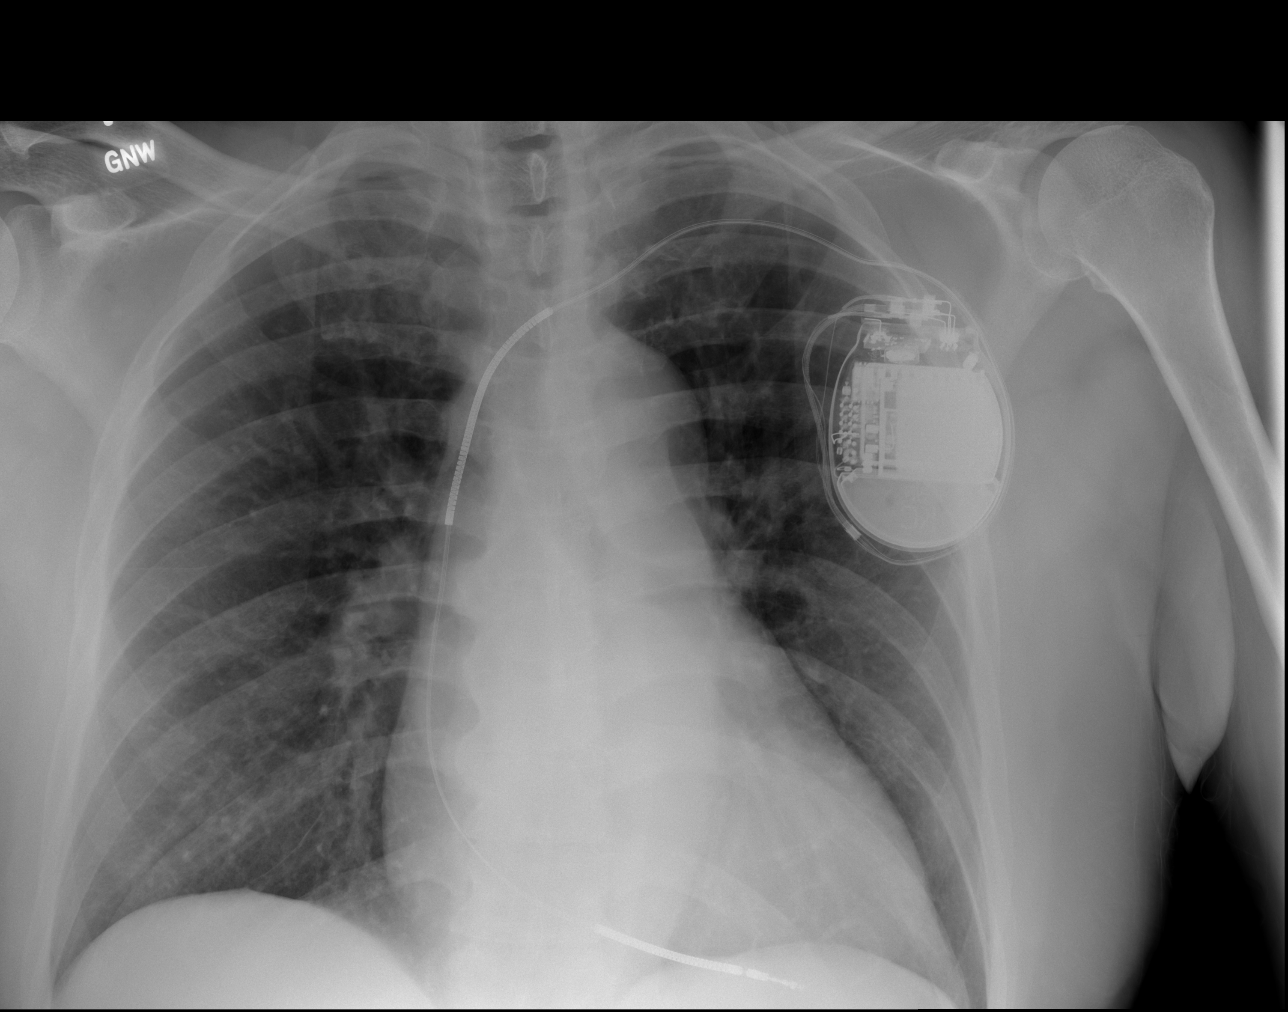

[x chest ap (2 of 2)]
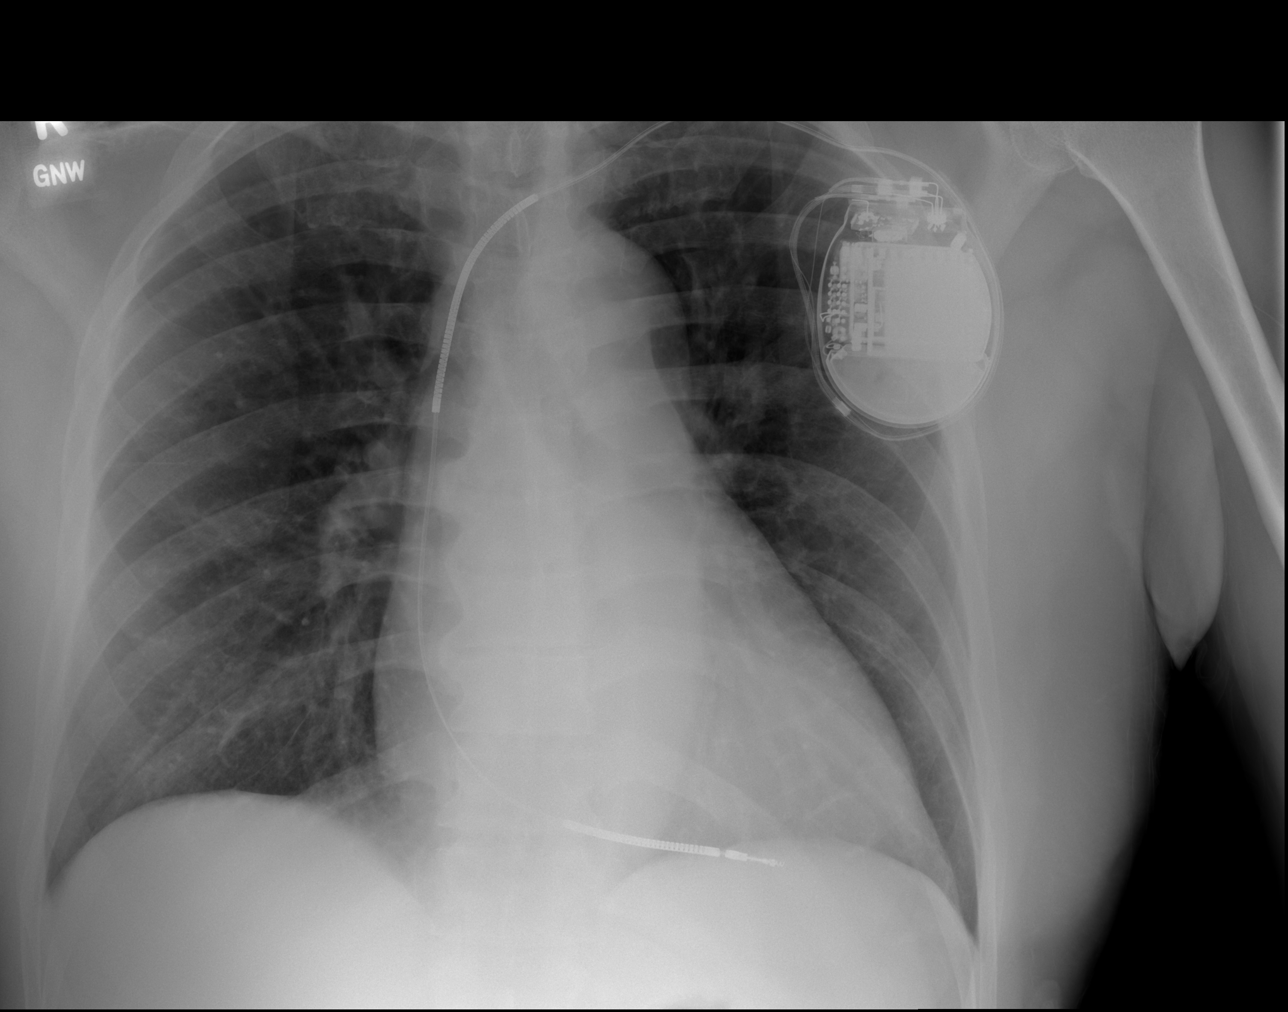

[w chest lat]
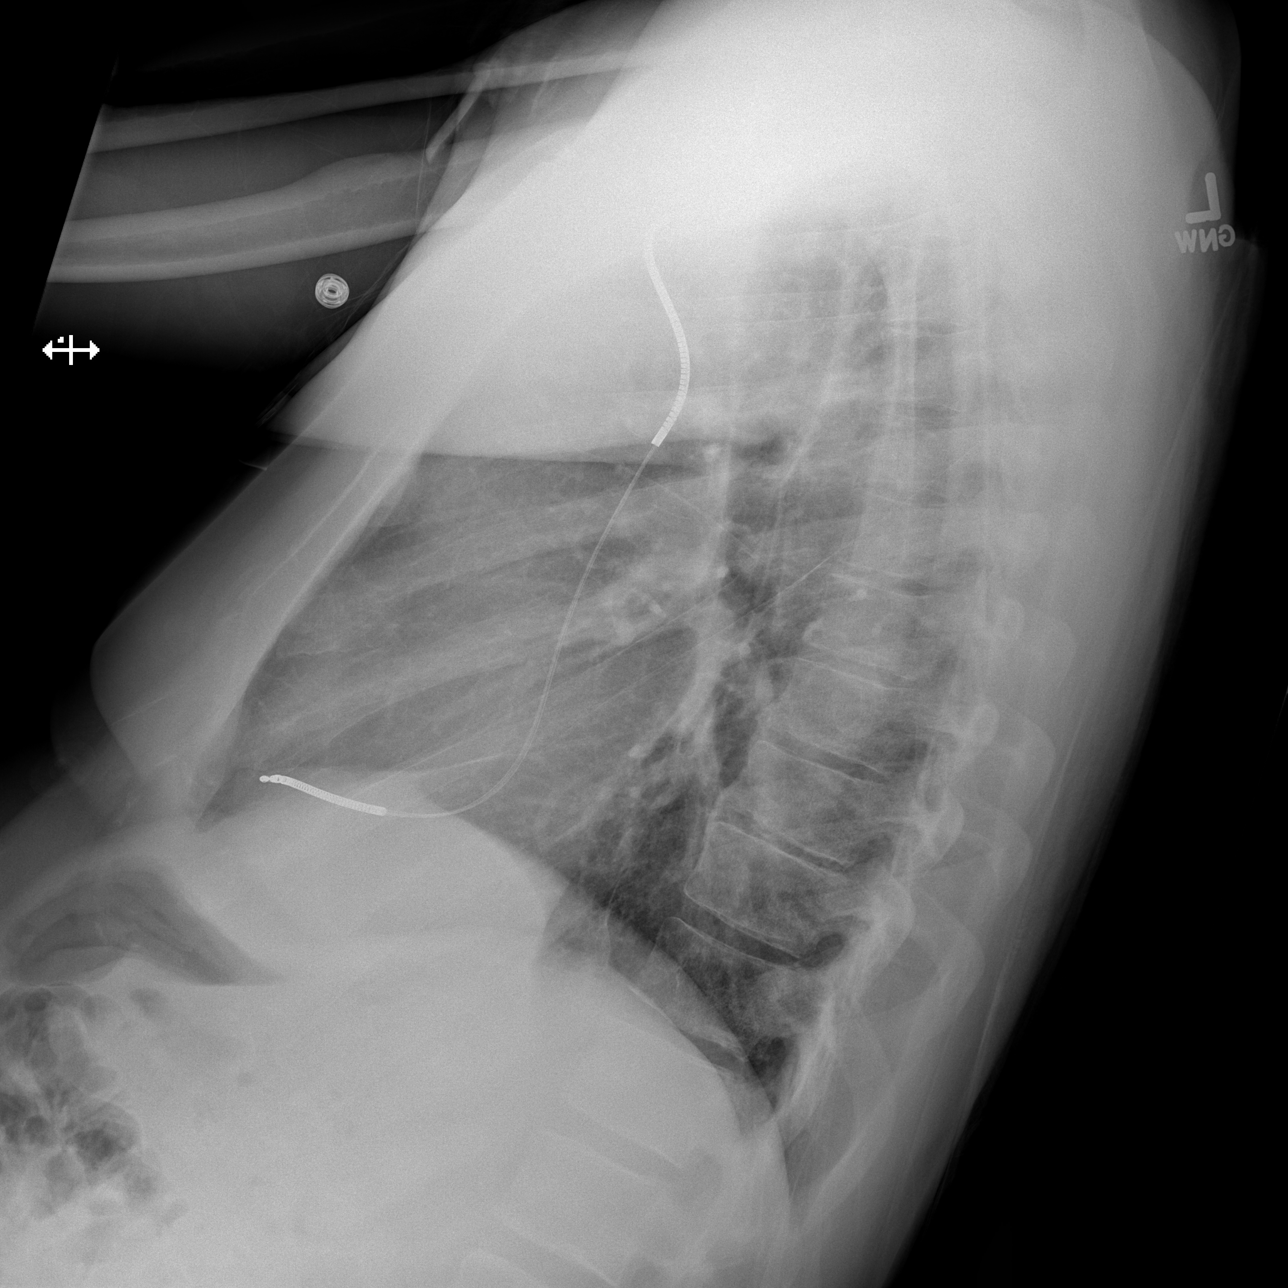

[3 of 3 positions shown; findings below may reference images not displayed]

FINDINGS: The cardiac silhouette remains borderline enlarged.
Stable left subclavian AICD lead.  Clear lungs with normal
vascularity.  Cervical spine fixation hardware.  Thoracic spine
degenerative changes.
IMPRESSION: No acute abnormality.
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
## 2013-03-20 IMAGING — CR DG FOOT COMPLETE 3+V*L*
3 series · 3 of 3 positions shown · non-contrast
Comparison: None.

CLINICAL DATA: Left foot pain and swelling.  History of gout.

LEFT FOOT - COMPLETE 3+ VIEW

[x foot ap left]
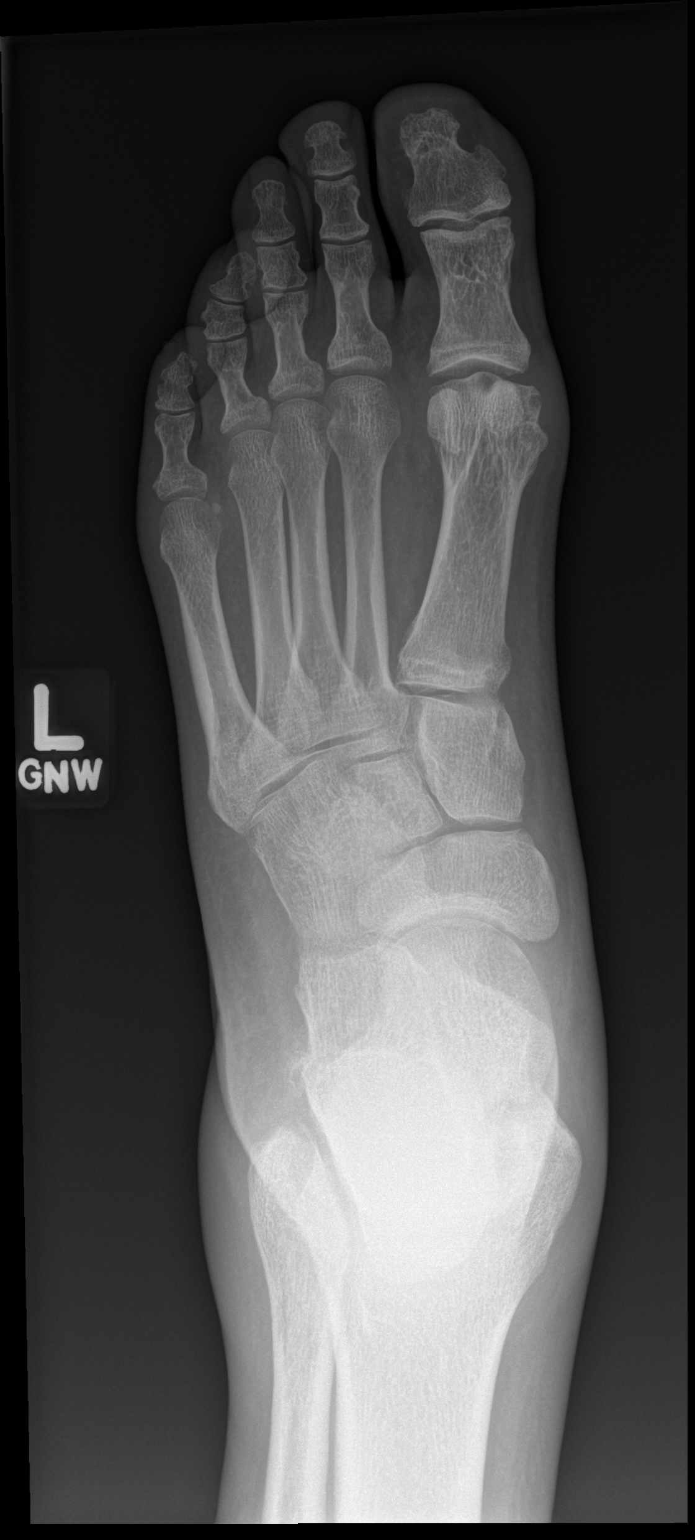

[x foot obl left]
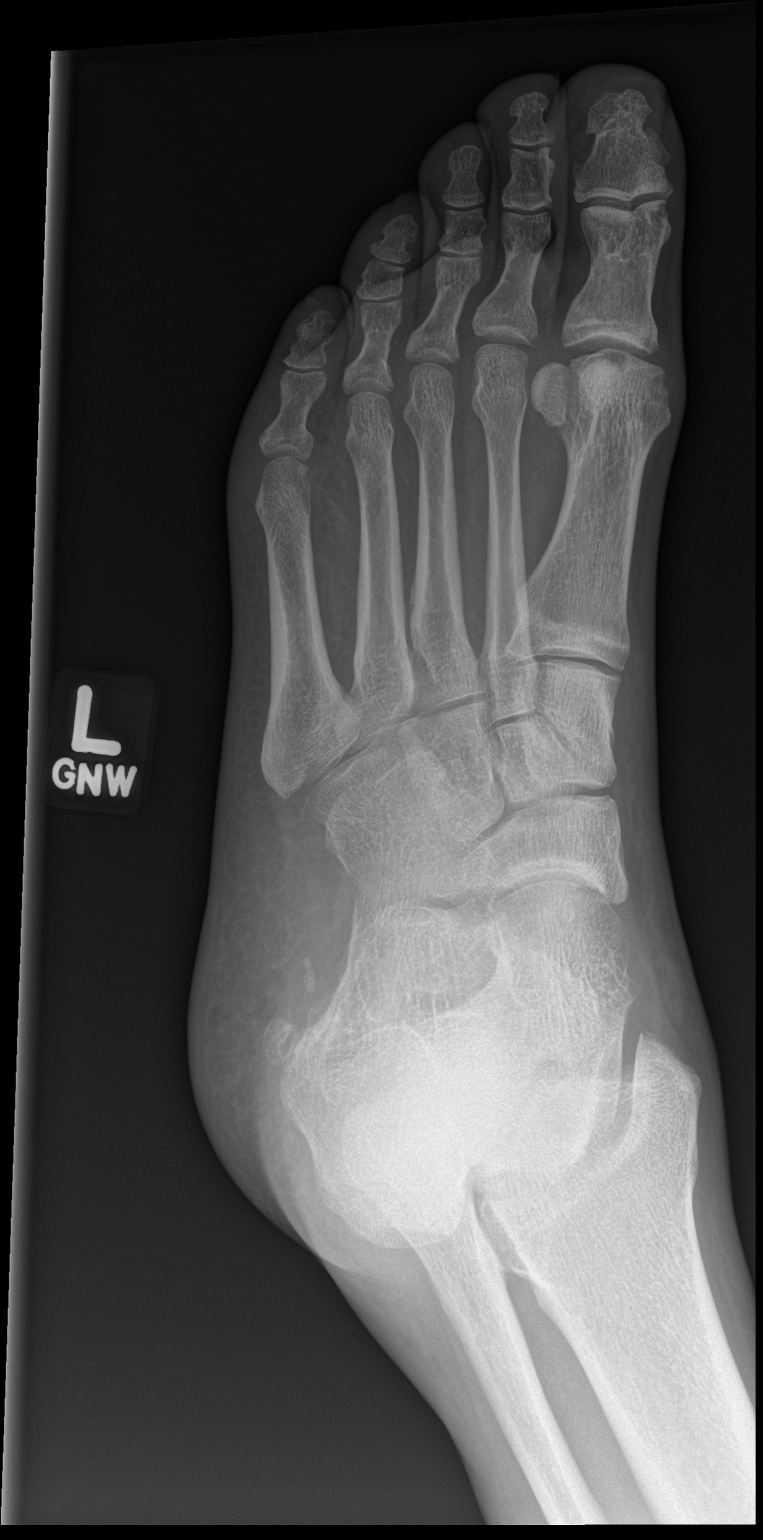

[x foot lat left]
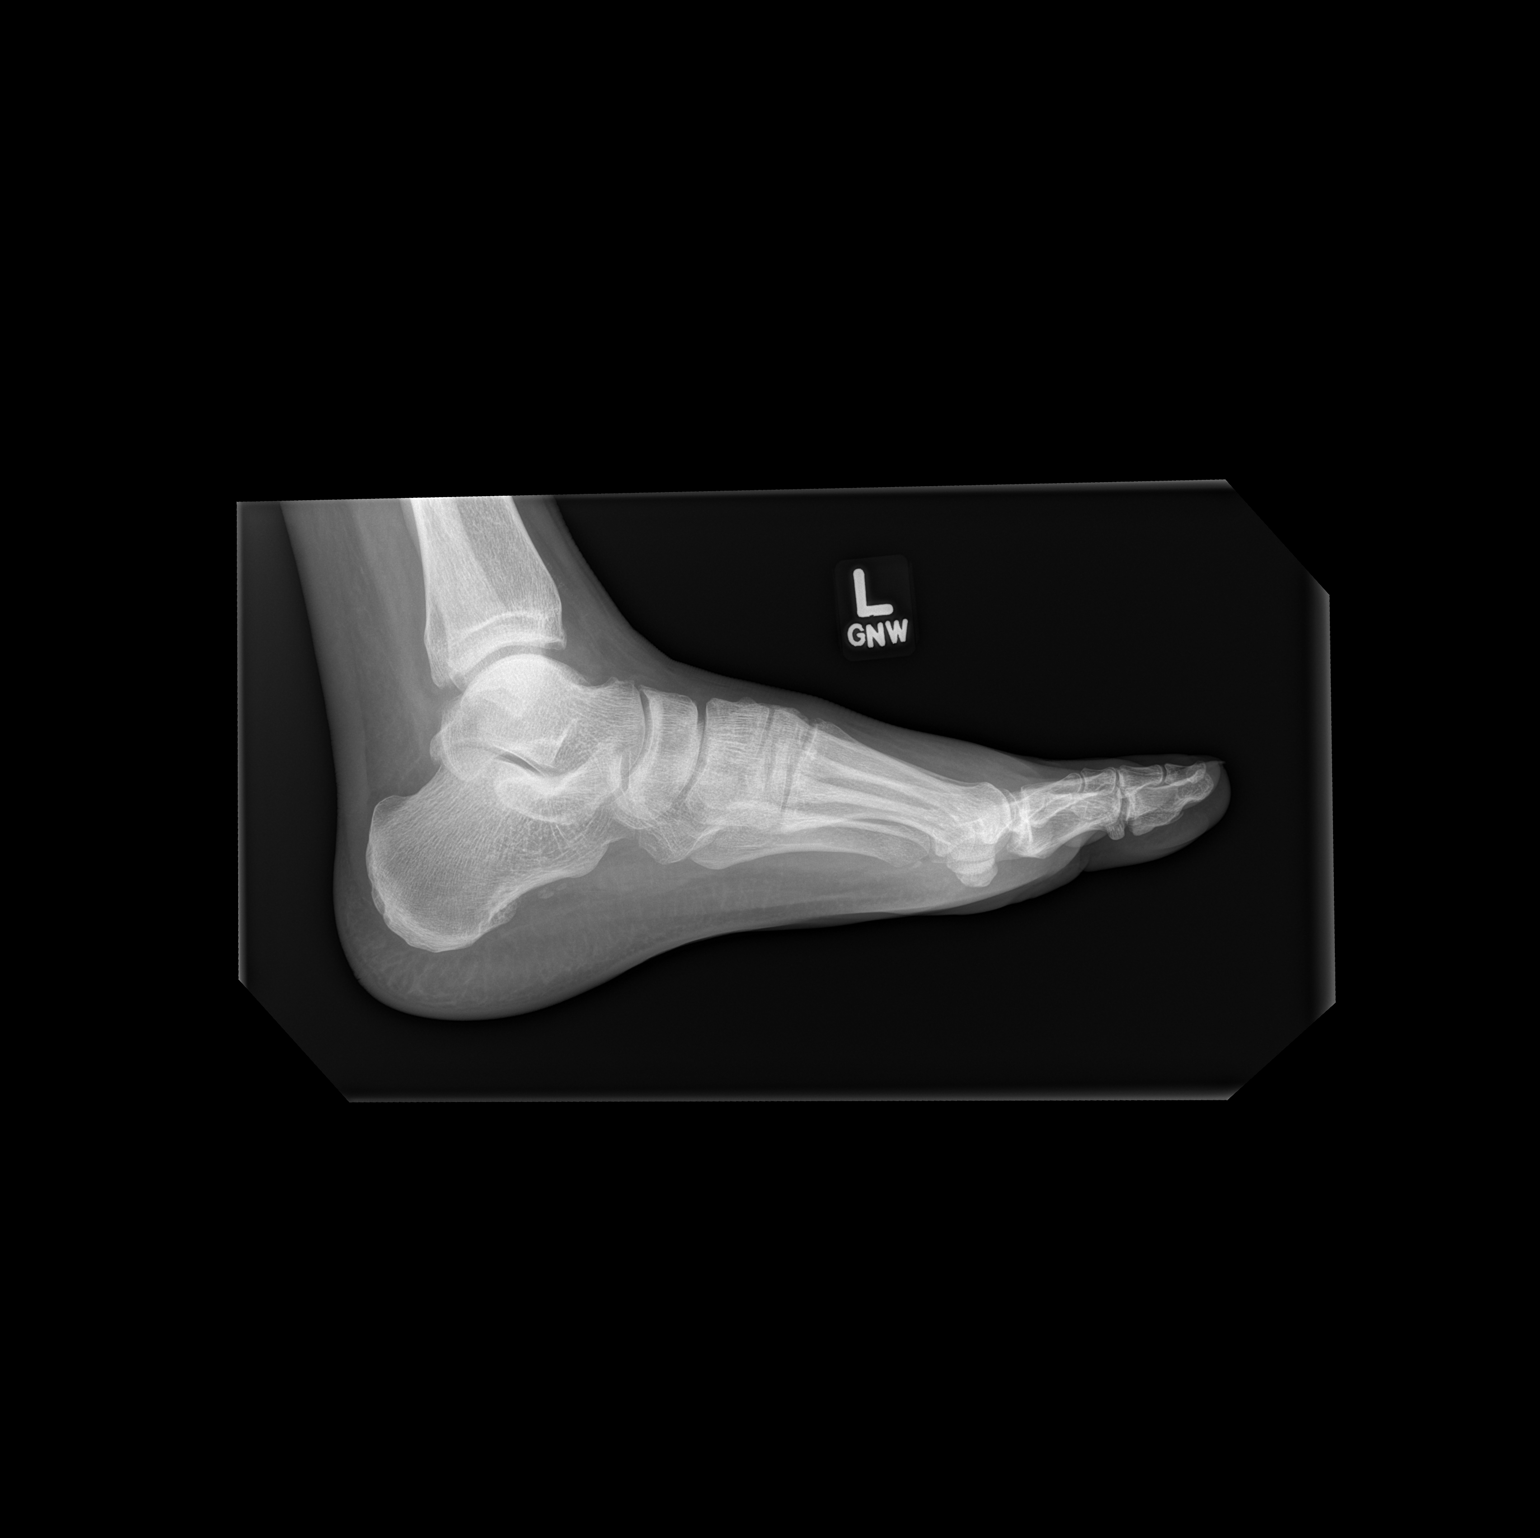

[3 of 3 positions shown; findings below may reference images not displayed]

FINDINGS: Small erosions in the distal aspect of the first
metatarsal head.  No soft tissue calcifications are seen.  Mild
inferior calcaneal spur formation.
IMPRESSION: 1.  No acute abnormality.
2.  Small erosions in the first metatarsal head, compatible with
the history of gout.

## 2013-03-28 ENCOUNTER — Encounter: Payer: Self-pay | Admitting: Internal Medicine

## 2013-04-03 ENCOUNTER — Telehealth: Payer: Self-pay | Admitting: Internal Medicine

## 2013-04-03 NOTE — Telephone Encounter (Signed)
04-03-13 lmm @ 1215pm for pt to set up past due defib ck with [facility], was due October 2014/mt

## 2013-04-21 ENCOUNTER — Other Ambulatory Visit (HOSPITAL_COMMUNITY): Payer: Self-pay

## 2013-04-21 ENCOUNTER — Telehealth (HOSPITAL_COMMUNITY): Payer: Self-pay | Admitting: Cardiology

## 2013-04-21 DIAGNOSIS — I5022 Chronic systolic (congestive) heart failure: Secondary | ICD-10-CM

## 2013-04-21 DIAGNOSIS — I5023 Acute on chronic systolic (congestive) heart failure: Secondary | ICD-10-CM

## 2013-04-21 MED ORDER — METOLAZONE 2.5 MG PO TABS: 2.5000 mg | ORAL_TABLET | Freq: Every day | ORAL | Status: DC | PRN

## 2013-04-21 NOTE — Telephone Encounter (Signed)
Sent message to Jiles Crocker with [facility] to arrange referral and continuation of care, waiting for response.

## 2013-04-21 NOTE — Telephone Encounter (Signed)
Called to inform office d/c'd from Hospice service-effective today 04/21/13 With need referral to [facility] to resume care Order for hospital bed Order for gel mattress Order for home O2 Metolazone refilled Pain management referral

## 2013-04-22 ENCOUNTER — Other Ambulatory Visit (HOSPITAL_COMMUNITY): Payer: Self-pay | Admitting: Cardiology

## 2013-04-22 DIAGNOSIS — I509 Heart failure, unspecified: Secondary | ICD-10-CM

## 2013-04-22 NOTE — Progress Notes (Signed)
Patient ID: [name], male   DOB: Aug 30, 1950, 63 y.o.   MRN: [id number]  Patient suffers from CHF and has trouble breathing at night when head is elevated less that 45 degrees. Bed wedges do not provide enough elevation to resolve breathing issues. SOB and LE edema cause patient to require frequent changes in body position which cannot be achieved with a normal bed.

## 2013-04-25 ENCOUNTER — Encounter: Payer: Self-pay | Admitting: Internal Medicine

## 2013-04-26 IMAGING — CR DG CHEST 1V PORT
1 series · 1 of 1 positions shown · non-contrast
Comparison: 10/07/2011

CLINICAL DATA: Chest pain and shortness of breath.

PORTABLE CHEST - 1 VIEW

[AP]
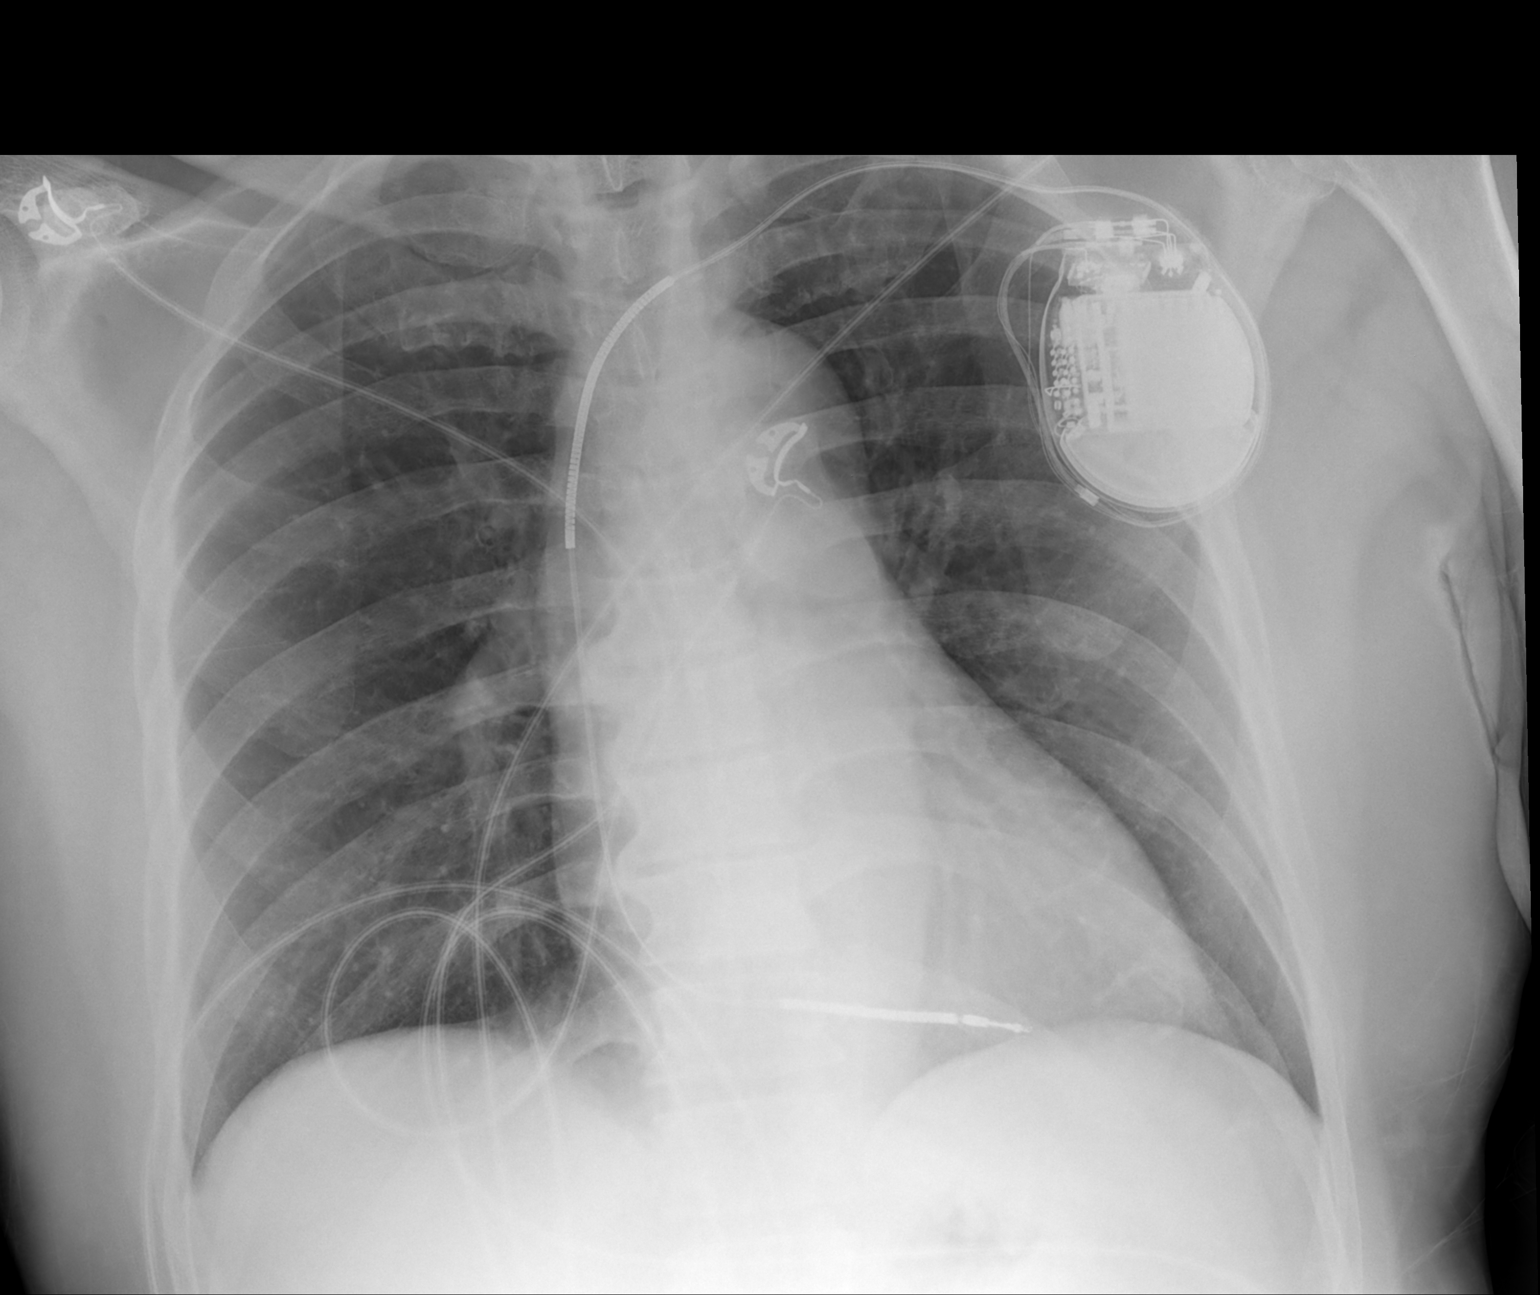

[1 of 1 positions shown; findings below may reference images not displayed]

FINDINGS: Pacer / AICD device with lead right ventricle.  Lower
cervical spine fixation. Numerous leads and wires project over the
chest.  Midline trachea.  Normal heart size.  No pleural effusion
or pneumothorax.  No congestive failure.

Minimal biapical pleural thickening. Clear lungs.
IMPRESSION: No acute cardiopulmonary disease.

## 2013-04-28 NOTE — Telephone Encounter (Signed)
Order placed, [facility] aware

## 2013-04-29 ENCOUNTER — Telehealth (HOSPITAL_COMMUNITY): Payer: Self-pay | Admitting: Cardiology

## 2013-04-29 ENCOUNTER — Encounter (HOSPITAL_COMMUNITY): Payer: Self-pay

## 2013-04-29 ENCOUNTER — Ambulatory Visit (HOSPITAL_BASED_OUTPATIENT_CLINIC_OR_DEPARTMENT_OTHER)
Admission: RE | Admit: 2013-04-29 | Discharge: 2013-04-29 | Disposition: A | Discharge status: Home / Self Care | Payer: Medicare Other | Source: Ambulatory Visit | Attending: Internal Medicine | Admitting: Internal Medicine

## 2013-04-29 VITALS — BP 104/72 | HR 102 | Resp 22 | Wt 252.2 lb

## 2013-04-29 DIAGNOSIS — I428 Other cardiomyopathies: Secondary | ICD-10-CM | POA: Diagnosis not present

## 2013-04-29 DIAGNOSIS — M109 Gout, unspecified: Secondary | ICD-10-CM | POA: Diagnosis not present

## 2013-04-29 DIAGNOSIS — I4729 Other ventricular tachycardia: Secondary | ICD-10-CM | POA: Diagnosis not present

## 2013-04-29 DIAGNOSIS — N184 Chronic kidney disease, stage 4 (severe): Secondary | ICD-10-CM | POA: Diagnosis not present

## 2013-04-29 DIAGNOSIS — Z7901 Long term (current) use of anticoagulants: Secondary | ICD-10-CM | POA: Diagnosis not present

## 2013-04-29 DIAGNOSIS — I509 Heart failure, unspecified: Secondary | ICD-10-CM

## 2013-04-29 DIAGNOSIS — I4891 Unspecified atrial fibrillation: Secondary | ICD-10-CM | POA: Insufficient documentation

## 2013-04-29 DIAGNOSIS — I2510 Atherosclerotic heart disease of native coronary artery without angina pectoris: Secondary | ICD-10-CM | POA: Diagnosis not present

## 2013-04-29 DIAGNOSIS — I129 Hypertensive chronic kidney disease with stage 1 through stage 4 chronic kidney disease, or unspecified chronic kidney disease: Secondary | ICD-10-CM | POA: Diagnosis not present

## 2013-04-29 DIAGNOSIS — D869 Sarcoidosis, unspecified: Secondary | ICD-10-CM | POA: Diagnosis not present

## 2013-04-29 DIAGNOSIS — I5022 Chronic systolic (congestive) heart failure: Secondary | ICD-10-CM

## 2013-04-29 DIAGNOSIS — Z95810 Presence of automatic (implantable) cardiac defibrillator: Secondary | ICD-10-CM | POA: Diagnosis not present

## 2013-04-29 DIAGNOSIS — I472 Ventricular tachycardia: Secondary | ICD-10-CM | POA: Diagnosis not present

## 2013-04-29 DIAGNOSIS — K219 Gastro-esophageal reflux disease without esophagitis: Secondary | ICD-10-CM | POA: Diagnosis not present

## 2013-04-29 DIAGNOSIS — N182 Chronic kidney disease, stage 2 (mild): Secondary | ICD-10-CM

## 2013-04-29 DIAGNOSIS — Z79899 Other long term (current) drug therapy: Secondary | ICD-10-CM | POA: Diagnosis not present

## 2013-04-29 DIAGNOSIS — I252 Old myocardial infarction: Secondary | ICD-10-CM | POA: Diagnosis not present

## 2013-04-29 DIAGNOSIS — E7800 Pure hypercholesterolemia, unspecified: Secondary | ICD-10-CM | POA: Diagnosis not present

## 2013-04-29 LAB — COMPREHENSIVE METABOLIC PANEL
ALT: 26 U/L (ref 0–53)
AST: 38 U/L — ABNORMAL HIGH (ref 0–37)
Albumin: 3.6 g/dL (ref 3.5–5.2)
Alkaline Phosphatase: 74 U/L (ref 39–117)
BUN: 55 mg/dL — AB (ref 6–23)
CALCIUM: 9.7 mg/dL (ref 8.4–10.5)
CO2: 26 mEq/L (ref 19–32)
CREATININE: 2.61 mg/dL — AB (ref 0.50–1.35)
Chloride: 96 mEq/L (ref 96–112)
GFR calc non Af Amer: 24 mL/min — ABNORMAL LOW (ref >90)
GFR, EST AFRICAN AMERICAN: 28 mL/min — AB (ref >90)
Glucose, Bld: 137 mg/dL — ABNORMAL HIGH (ref 70–99)
Potassium: 3.1 mEq/L — ABNORMAL LOW (ref 3.7–5.3)
Sodium: 139 mEq/L (ref 137–147)
TOTAL PROTEIN: 7.8 g/dL (ref 6.0–8.3)
Total Bilirubin: 0.8 mg/dL (ref 0.3–1.2)

## 2013-04-29 LAB — CBC
HCT: 42.1 % (ref 39.0–52.0)
Hemoglobin: 14.4 g/dL (ref 13.0–17.0)
MCH: 30.3 pg (ref 26.0–34.0)
MCHC: 34.2 g/dL (ref 30.0–36.0)
MCV: 88.4 fL (ref 78.0–100.0)
Platelets: 204 10*3/uL (ref 150–400)
RBC: 4.76 MIL/uL (ref 4.22–5.81)
RDW: 13.2 % (ref 11.5–15.5)
WBC: 5.8 10*3/uL (ref 4.0–10.5)

## 2013-04-29 LAB — PROTIME-INR
INR: 1.04 (ref 0.00–1.49)
PROTHROMBIN TIME: 13.4 s (ref 11.6–15.2)

## 2013-04-29 LAB — TSH: TSH: 4.76 u[IU]/mL — ABNORMAL HIGH (ref 0.350–4.500)

## 2013-04-29 MED ORDER — FUROSEMIDE 10 MG/ML IJ SOLN
80.0000 mg | Freq: Once | INTRAMUSCULAR | Status: AC
  Administered 2013-04-29: 80 mg via INTRAVENOUS

## 2013-04-29 MED ORDER — POTASSIUM CHLORIDE CRYS ER 20 MEQ PO TBCR
40.0000 meq | EXTENDED_RELEASE_TABLET | Freq: Once | ORAL | Status: AC
  Administered 2013-04-29: 40 meq via ORAL

## 2013-04-29 MED ORDER — HEPARIN SOD (PORK) LOCK FLUSH 100 UNIT/ML IV SOLN
250.0000 [IU] | INTRAVENOUS | Status: AC | PRN
  Administered 2013-04-29: 250 [IU]

## 2013-04-29 NOTE — Progress Notes (Signed)
EKG CRITICAL VALUE     12 lead EKG performed.  Critical value noted.  Vilinda Blanks, RN notified.   Vanessa Barbara, CCT 04/29/2013 9:53 AM

## 2013-04-29 NOTE — Telephone Encounter (Signed)
Pt scheduled for RHC on 04/30/13 cpt code 93452 icd9 [id number] With pts current insurance [facility]- pre cert approved  #[id number]

## 2013-04-29 NOTE — Progress Notes (Addendum)
IV team called to clinic to assist in lab draws from [place] port, followed by 80 iv lasix which IV team heparinized afterwards.  Patient also received 40 meq PO potassium. Patient resting comfortably on stretcher with wife at bedside and call bell in reach.  Will continue to monitor closely.  Total urinary output since 80 IV lasix administered: 250cc clear yellow non-odorous urine [name] Pain

## 2013-04-29 NOTE — Progress Notes (Addendum)
Patient ID: [name], male   DOB: [birth year], 63 y.o.   MRN: [id number]  HPI: Mr. Demirjian is a 62 y.o. gentlemen with severe HF due to NICM (EF 20-25%) with multiple hospital admissions for HF exacerbations. He also has history of ventricular tachycardia s/p St Jude ICD by Dr. Lovena Le, CKD: stage IV, and paroxysmal atrial arrhythmias on amiodarone as well as sarcoidosis and hypertension. Cath in 2008 by Dr. Terrence Dupont showed no CAD.Seen at [facility] and not felt to be a transplant candidate due to renal failure and lack of family support.  Milrinone initiated in January 2014 for low output. 03/01/12 milrinone increased 0.314mc/kg/min.   RHC 01/26/12  RA = 14  RV = 51/16/15  PA = 57/34 (43)  PCW = 26  Fick cardiac output/index = 4.2/1.8  Thermo CO/CI = 3.9/1.7  PVR = 4.0 Woods  O2 sat = 97%  PA sat = 57%, 58%  Ao Pressure (non-invasive): 116/80 (93)  SVR = 1504   ECHO 03/22/12 EF 15%   04/26/12 S/P Successful DC-CV for atrial fibrillation. Remains on amiodarone.   He returns for an acute work in due to chest pain,dyspnea, and arm pain. He says he has been had progressive symptoms over the last 48 hours. Complains of N/V and leg fatigue. Complains of abdominal bloating. Weight at home 243-250 pounds . Poor appetite. Weight at home 246-248 lbs. Has hospital bed at home.Hospice discharged. Now followed by [facility] or home milrinone and weekly lab work.     He is not on an ace inhibitor due to elevated creatinine. Not on nitrates due to Viagra use.   08/15/12 Creatinine 4.24 Potassium 3.3 09/19/12 Creatinine 3.98, Potassium 3.1, BUN 78 10/07/12 Creatinine 2.62, Potassium 3.4 10/18/12 Creatinine 3.3, K+3.1 11/08/12 Creatinine 2.15 K 4.0 11/25/12 Creatinine 2.93, K+ 3.5 03/10/13: K+ 2.9, Creatinine 2.63  ROS: All systems negative except as listed in HPI, PMH and Problem List.  Past Medical History  Diagnosis Date  . CHF (congestive heart failure)   . Sarcoidosis   . Cardiomyopathy, dilated,  nonischemic     non ischemic by cath  . Acute on chronic systolic heart failure   . Automatic implantable cardiac defibrillator in situ   . Atrial fibrillation   . NSVT (nonsustained ventricular tachycardia)   . GERD (gastroesophageal reflux disease)   . Hypercholesteremia   . Myocardial infarction   . Shortness of breath   . Chronic kidney disease (CKD), stage III (moderate)   . Pacemaker   . Anginal pain   . Gout   . Hypertension     dr [name]  . Coronary artery disease    Current Outpatient Prescriptions  Medication Sig Dispense Refill  . allopurinol (ZYLOPRIM) 100 MG tablet Take 1 tablet (100 mg total) by mouth daily.  30 tablet  6  . amiodarone (PACERONE) 200 MG tablet Take 200 mg by mouth 2 (two) times daily.      Marland Kitchen apixaban (ELIQUIS) 5 MG TABS tablet Take 1 tablet (5 mg total) by mouth 2 (two) times daily.  60 tablet  4  . carvedilol (COREG) 3.125 MG tablet Take 3.125 mg by mouth 2 (two) times daily with a meal.      . eplerenone (INSPRA) 25 MG tablet Take 0.5 tablets (12.5 mg total) by mouth daily.  15 tablet  6  . esomeprazole (NEXIUM) 40 MG capsule Take 1 capsule (40 mg total) by mouth daily.  30 capsule  3  . fluticasone (FLONASE) 50 MCG/ACT nasal spray  Place 2 sprays into both nostrils daily as needed for allergies.       Marland Kitchen LORazepam (ATIVAN) 0.5 MG tablet Take 1-2 tablets (0.5-1 mg total) by mouth 2 (two) times daily as needed for anxiety.  50 tablet  0  . Melatonin 3 MG CAPS Take by mouth.      . metolazone (ZAROXOLYN) 2.5 MG tablet Take 1 tablet (2.5 mg total) by mouth daily as needed (for fluid retention).  10 tablet  1  . milrinone (PRIMACOR) 20 MG/100ML SOLN infusion Inject 37.3125 mcg/min into the vein continuous.  100 mL    . oxycodone (OXY-IR) 5 MG capsule Take 10 mg by mouth every 4 (four) hours as needed.      . potassium chloride SA (K-DUR,KLOR-CON) 20 MEQ tablet Take 60 mEq by mouth 2 (two) times daily.      . promethazine (PHENERGAN) 25 MG tablet Take 25  mg by mouth every 6 (six) hours as needed for nausea or vomiting.      . senna-docusate (SENOKOT-S) 8.6-50 MG per tablet Take 1-2 tablets by mouth 2 (two) times daily as needed for mild constipation or moderate constipation.      . sildenafil (VIAGRA) 100 MG tablet Take 1 tablet (100 mg total) by mouth daily as needed for erectile dysfunction.  4 tablet  1  . torsemide (DEMADEX) 20 MG tablet Take 60 mg by mouth daily.      Marland Kitchen zolpidem (AMBIEN) 5 MG tablet Take 5 mg by mouth at bedtime as needed for sleep.       No current facility-administered medications for this encounter.   Filed Vitals:   04/29/13 0926  BP: 104/72  Pulse: 102  Resp: 22  Weight: 252 lb 4 oz (114.42 kg)  SpO2: 95%    PHYSICAL EXAM: General: NAD; chronically ill appearing. Dyspnea walking into the clinicc HEENT: normal  Neck: supple. JVP 8-9. Carotids 2+ bilat; no bruits. No lymphadenopathy or thryomegaly appreciated.  Cor: PMI laterally displaced. Regular S1 S2 with +S3 and 2/6 HSM apex. R upper chest hickman site with Milrinone infusing.  Lungs: clear Abdomen: soft, nontender, obese, +distended, No bruits or masses. Good bowel sounds.  Extremities: no cyanosis, clubbing, rash, no lower extremity edema  Neuro: alert & orientedx3, cranial nerves grossly intact. moves all 4 extremities w/o difficulty. Affect pleasant   EKG: NSR 98   ASSESSMENT & PLAN:   1) Chronic systolic HF: NICM, EF 29% (03/2012). He has been on home Milrinone 0.375 mcg over 1 year.    Currently NYHA IIIb/IV symptoms. He presents today for an acute acute work in with nausea, vomiting, and increased dyspnea with exertion.  Mild volume overload noted but this may represent low output heart failure.  Will give 80 mg IV lasix and 40 meq of potassium. Plan to continue 60 mg torsemide daily and 60 meq potassium twice a day.   - Continue Inspra 12.5 mg daily. Unable to tolerate Spironolactone due to gynecomastia. He is no longer on Imdur due to viagra  use.  - Continue milrinone 0.375 mcg through hickman cath - Remain on coreg 3.125 mg BID for now. If RHC worse tomorrow will ned to stop.   - No ACEI with CKD - Reinforced the need and importance of daily weights, a low sodium diet, and fluid restriction (less than 2 L a day). Instructed to call the HF clinic if weight increases more than 3 lbs overnight or 5 lbs in a week.  Will plan on  RHC tomorrow to reassess hemodynamics.  2) Atrial fibrillation/flutter: Paroxysmal -SR today . Continue eliquis 5 mg BID and amiodarone 200 mg twice a day. He is not having any bleeding issues. Check TSH and LFTs today.  3) CKD, stage IV -Check BMET today.   He was observed 2 hours in HF clinic while receiving IV lasix. As above RHC tomorrow tomorrow to further assess assess hemodynamics. Check pre cath labs this am.   Conrad Finley NP-C  9:37 AM

## 2013-04-29 NOTE — Patient Instructions (Addendum)
Follow up in 2 weeks   Take extra 60 meq potassium ( 3 tabs) once you get home!  Do the following things EVERYDAY: 1) Weigh yourself in the morning before breakfast. Write it down and keep it in a log. 2) Take your medicines as prescribed 3) Eat low salt foods-Limit salt (sodium) to 2000 mg per day.  4) Stay as active as you can everyday 5) Limit all fluids for the day to less than 2 liters

## 2013-04-30 ENCOUNTER — Encounter (HOSPITAL_COMMUNITY): Payer: Medicare Other

## 2013-04-30 ENCOUNTER — Encounter (HOSPITAL_COMMUNITY)
Admission: RE | Disposition: A | Discharge status: Home / Self Care | Payer: Self-pay | Source: Ambulatory Visit | Attending: Internal Medicine

## 2013-04-30 ENCOUNTER — Encounter (HOSPITAL_COMMUNITY): Payer: Self-pay | Admitting: Pharmacy Technician

## 2013-04-30 ENCOUNTER — Ambulatory Visit (HOSPITAL_COMMUNITY)
Admission: RE | Admit: 2013-04-30 | Discharge: 2013-04-30 | Disposition: A | Discharge status: Home / Self Care | Payer: Medicare Other | Source: Ambulatory Visit | Attending: Internal Medicine | Admitting: Internal Medicine

## 2013-04-30 DIAGNOSIS — I2510 Atherosclerotic heart disease of native coronary artery without angina pectoris: Secondary | ICD-10-CM | POA: Insufficient documentation

## 2013-04-30 DIAGNOSIS — I509 Heart failure, unspecified: Secondary | ICD-10-CM

## 2013-04-30 DIAGNOSIS — N184 Chronic kidney disease, stage 4 (severe): Secondary | ICD-10-CM | POA: Insufficient documentation

## 2013-04-30 DIAGNOSIS — M109 Gout, unspecified: Secondary | ICD-10-CM | POA: Insufficient documentation

## 2013-04-30 DIAGNOSIS — I4729 Other ventricular tachycardia: Secondary | ICD-10-CM | POA: Insufficient documentation

## 2013-04-30 DIAGNOSIS — Z95810 Presence of automatic (implantable) cardiac defibrillator: Secondary | ICD-10-CM | POA: Insufficient documentation

## 2013-04-30 DIAGNOSIS — I472 Ventricular tachycardia, unspecified: Secondary | ICD-10-CM | POA: Insufficient documentation

## 2013-04-30 DIAGNOSIS — D869 Sarcoidosis, unspecified: Secondary | ICD-10-CM | POA: Insufficient documentation

## 2013-04-30 DIAGNOSIS — I129 Hypertensive chronic kidney disease with stage 1 through stage 4 chronic kidney disease, or unspecified chronic kidney disease: Secondary | ICD-10-CM | POA: Insufficient documentation

## 2013-04-30 DIAGNOSIS — E7800 Pure hypercholesterolemia, unspecified: Secondary | ICD-10-CM | POA: Insufficient documentation

## 2013-04-30 DIAGNOSIS — I5022 Chronic systolic (congestive) heart failure: Secondary | ICD-10-CM | POA: Insufficient documentation

## 2013-04-30 DIAGNOSIS — Z79899 Other long term (current) drug therapy: Secondary | ICD-10-CM | POA: Insufficient documentation

## 2013-04-30 DIAGNOSIS — K219 Gastro-esophageal reflux disease without esophagitis: Secondary | ICD-10-CM | POA: Insufficient documentation

## 2013-04-30 DIAGNOSIS — Z7901 Long term (current) use of anticoagulants: Secondary | ICD-10-CM | POA: Insufficient documentation

## 2013-04-30 DIAGNOSIS — I428 Other cardiomyopathies: Secondary | ICD-10-CM | POA: Insufficient documentation

## 2013-04-30 DIAGNOSIS — I4891 Unspecified atrial fibrillation: Secondary | ICD-10-CM | POA: Insufficient documentation

## 2013-04-30 DIAGNOSIS — I252 Old myocardial infarction: Secondary | ICD-10-CM | POA: Insufficient documentation

## 2013-04-30 HISTORY — PX: RIGHT HEART CATHETERIZATION: SHX5447

## 2013-04-30 LAB — POCT I-STAT 3, VENOUS BLOOD GAS (G3P V)
Acid-Base Excess: 2 mmol/L (ref 0.0–2.0)
BICARBONATE: 28.4 meq/L — AB (ref 20.0–24.0)
Bicarbonate: 26 mEq/L — ABNORMAL HIGH (ref 20.0–24.0)
O2 SAT: 64 %
O2 Saturation: 62 %
PH VEN: 7.373 — AB (ref 7.250–7.300)
PO2 VEN: 34 mmHg (ref 30.0–45.0)
TCO2: 30 mmol/L (ref 0–100)
TCO2: 27 mmol/L (ref 0–100)
pCO2, Ven: 44.8 mmHg — ABNORMAL LOW (ref 45.0–50.0)
pCO2, Ven: 48.8 mmHg (ref 45.0–50.0)
pH, Ven: 7.372 — ABNORMAL HIGH (ref 7.250–7.300)
pO2, Ven: 34 mmHg (ref 30.0–45.0)

## 2013-04-30 LAB — BASIC METABOLIC PANEL
BUN: 55 mg/dL — ABNORMAL HIGH (ref 6–23)
CO2: 29 meq/L (ref 19–32)
Calcium: 9.8 mg/dL (ref 8.4–10.5)
Chloride: 96 mEq/L (ref 96–112)
Creatinine, Ser: 2.4 mg/dL — ABNORMAL HIGH (ref 0.50–1.35)
GFR calc Af Amer: 31 mL/min — ABNORMAL LOW (ref >90)
GFR calc non Af Amer: 27 mL/min — ABNORMAL LOW (ref >90)
Glucose, Bld: 123 mg/dL — ABNORMAL HIGH (ref 70–99)
Potassium: 3.7 mEq/L (ref 3.7–5.3)
SODIUM: 141 meq/L (ref 137–147)

## 2013-04-30 SURGERY — RIGHT HEART CATH
Anesthesia: LOCAL

## 2013-04-30 MED ORDER — SODIUM CHLORIDE 0.9 % IV SOLN: 250.0000 mL | INTRAVENOUS | Status: DC | PRN

## 2013-04-30 MED ORDER — DIAZEPAM 5 MG PO TABS
ORAL_TABLET | ORAL | Status: AC
  Filled 2013-04-30: qty 1

## 2013-04-30 MED ORDER — FENTANYL CITRATE 0.05 MG/ML IJ SOLN
INTRAMUSCULAR | Status: AC
  Filled 2013-04-30: qty 2

## 2013-04-30 MED ORDER — DIAZEPAM 5 MG PO TABS
5.0000 mg | ORAL_TABLET | Freq: Once | ORAL | Status: AC
  Administered 2013-04-30: 5 mg via ORAL

## 2013-04-30 MED ORDER — HEPARIN SOD (PORK) LOCK FLUSH 100 UNIT/ML IV SOLN
250.0000 [IU] | INTRAVENOUS | Status: AC | PRN
  Administered 2013-04-30: 250 [IU]

## 2013-04-30 MED ORDER — HEPARIN (PORCINE) IN NACL 2-0.9 UNIT/ML-% IJ SOLN
INTRAMUSCULAR | Status: AC
  Filled 2013-04-30: qty 1000

## 2013-04-30 MED ORDER — SODIUM CHLORIDE 0.9 % IJ SOLN: 3.0000 mL | Freq: Two times a day (BID) | INTRAMUSCULAR | Status: DC

## 2013-04-30 MED ORDER — SODIUM CHLORIDE 0.9 % IJ SOLN: 3.0000 mL | INTRAMUSCULAR | Status: DC | PRN

## 2013-04-30 MED ORDER — LIDOCAINE HCL (PF) 1 % IJ SOLN
INTRAMUSCULAR | Status: AC
  Filled 2013-04-30: qty 30

## 2013-04-30 MED ORDER — MIDAZOLAM HCL 2 MG/2ML IJ SOLN
INTRAMUSCULAR | Status: AC
  Filled 2013-04-30: qty 2

## 2013-04-30 MED ORDER — NITROGLYCERIN IN D5W 200-5 MCG/ML-% IV SOLN
INTRAVENOUS | Status: AC
  Filled 2013-04-30: qty 250

## 2013-04-30 MED ORDER — ASPIRIN 81 MG PO CHEW: 81.0000 mg | CHEWABLE_TABLET | ORAL | Status: DC

## 2013-04-30 MED ORDER — SODIUM CHLORIDE 0.9 % IV SOLN
INTRAVENOUS | Status: DC
Start: 2013-05-01 — End: 2013-04-30
  Administered 2013-04-30: 12:00:00 via INTRAVENOUS

## 2013-04-30 MED ORDER — DIAZEPAM 2 MG PO TABS: 2.0000 mg | ORAL_TABLET | ORAL | Status: DC

## 2013-04-30 NOTE — Discharge Instructions (Signed)
Wound Care Wound care helps prevent pain and infection.   HOME CARE   Only take medicine as told by your doctor.  Clean the wound daily with mild soap and water.  Remove bandages (dressings) in 24 hours or as told by your doctor.  Change the bandage if it gets wet, dirty, or starts to smell.  Take showers. Do not take baths, swim, or do anything that puts your wound under water.  Keep all doctor visits as told. GET HELP RIGHT AWAY IF:   Yellowish-white fluid (pus) comes from the wound.  Medicine does not lessen your pain.  There is a red streak going away from the wound.  You have a fever. MAKE SURE YOU:   Understand these instructions.  Will watch your condition.  Will get help right away if you are not doing well or get worse. Document Released: 09/28/2007 Document Revised: 03/13/2011 Document Reviewed: 04/24/2010 [facility] Patient Information 2014 [place], [place].

## 2013-04-30 NOTE — H&P (View-Only) (Signed)
Patient ID: [name], male   DOB: [birth year], 63 y.o.   MRN: [id number]  HPI: [name] is a 62 y.o. gentlemen with severe HF due to NICM (EF 20-25%) with multiple hospital admissions for HF exacerbations. He also has history of ventricular tachycardia s/p St Jude ICD by Dr. Lovena Le, CKD: stage IV, and paroxysmal atrial arrhythmias on amiodarone as well as sarcoidosis and hypertension. Cath in 2008 by Dr. Terrence Dupont showed no CAD.Seen at [facility] and not felt to be a transplant candidate due to renal failure and lack of family support.  Milrinone initiated in January 2014 for low output. 03/01/12 milrinone increased 0.314mc/kg/min.   RHC 01/26/12  RA = 14  RV = 51/16/15  PA = 57/34 (43)  PCW = 26  Fick cardiac output/index = 4.2/1.8  Thermo CO/CI = 3.9/1.7  PVR = 4.0 Woods  O2 sat = 97%  PA sat = 57%, 58%  Ao Pressure (non-invasive): 116/80 (93)  SVR = 1504   ECHO 03/22/12 EF 15%   04/26/12 S/P Successful DC-CV for atrial fibrillation. Remains on amiodarone.   He returns for an acute work in due to chest pain,dyspnea, and arm pain. He says he has been had progressive symptoms over the last 48 hours. Complains of N/V and leg fatigue. Complains of abdominal bloating. Weight at home 243-250 pounds . Poor appetite. Weight at home 246-248 lbs. Has hospital bed at home.Hospice discharged. Now followed by [facility] or home milrinone and weekly lab work.     He is not on an ace inhibitor due to elevated creatinine. Not on nitrates due to Viagra use.   08/15/12 Creatinine 4.24 Potassium 3.3 09/19/12 Creatinine 3.98, Potassium 3.1, BUN 78 10/07/12 Creatinine 2.62, Potassium 3.4 10/18/12 Creatinine 3.3, K+3.1 11/08/12 Creatinine 2.15 K 4.0 11/25/12 Creatinine 2.93, K+ 3.5 03/10/13: K+ 2.9, Creatinine 2.63  ROS: All systems negative except as listed in HPI, PMH and Problem List.  Past Medical History  Diagnosis Date  . CHF (congestive heart failure)   . Sarcoidosis   . Cardiomyopathy, dilated,  nonischemic     non ischemic by cath  . Acute on chronic systolic heart failure   . Automatic implantable cardiac defibrillator in situ   . Atrial fibrillation   . NSVT (nonsustained ventricular tachycardia)   . GERD (gastroesophageal reflux disease)   . Hypercholesteremia   . Myocardial infarction   . Shortness of breath   . Chronic kidney disease (CKD), stage III (moderate)   . Pacemaker   . Anginal pain   . Gout   . Hypertension     dr [name]  . Coronary artery disease    Current Outpatient Prescriptions  Medication Sig Dispense Refill  . allopurinol (ZYLOPRIM) 100 MG tablet Take 1 tablet (100 mg total) by mouth daily.  30 tablet  6  . amiodarone (PACERONE) 200 MG tablet Take 200 mg by mouth 2 (two) times daily.      Marland Kitchen apixaban (ELIQUIS) 5 MG TABS tablet Take 1 tablet (5 mg total) by mouth 2 (two) times daily.  60 tablet  4  . carvedilol (COREG) 3.125 MG tablet Take 3.125 mg by mouth 2 (two) times daily with a meal.      . eplerenone (INSPRA) 25 MG tablet Take 0.5 tablets (12.5 mg total) by mouth daily.  15 tablet  6  . esomeprazole (NEXIUM) 40 MG capsule Take 1 capsule (40 mg total) by mouth daily.  30 capsule  3  . fluticasone (FLONASE) 50 MCG/ACT nasal spray  Place 2 sprays into both nostrils daily as needed for allergies.       . LORazepam (ATIVAN) 0.5 MG tablet Take 1-2 tablets (0.5-1 mg total) by mouth 2 (two) times daily as needed for anxiety.  50 tablet  0  . Melatonin 3 MG CAPS Take by mouth.      . metolazone (ZAROXOLYN) 2.5 MG tablet Take 1 tablet (2.5 mg total) by mouth daily as needed (for fluid retention).  10 tablet  1  . milrinone (PRIMACOR) 20 MG/100ML SOLN infusion Inject 37.3125 mcg/min into the vein continuous.  100 mL    . oxycodone (OXY-IR) 5 MG capsule Take 10 mg by mouth every 4 (four) hours as needed.      . potassium chloride SA (K-DUR,KLOR-CON) 20 MEQ tablet Take 60 mEq by mouth 2 (two) times daily.      . promethazine (PHENERGAN) 25 MG tablet Take 25  mg by mouth every 6 (six) hours as needed for nausea or vomiting.      . senna-docusate (SENOKOT-S) 8.6-50 MG per tablet Take 1-2 tablets by mouth 2 (two) times daily as needed for mild constipation or moderate constipation.      . sildenafil (VIAGRA) 100 MG tablet Take 1 tablet (100 mg total) by mouth daily as needed for erectile dysfunction.  4 tablet  1  . torsemide (DEMADEX) 20 MG tablet Take 60 mg by mouth daily.      . zolpidem (AMBIEN) 5 MG tablet Take 5 mg by mouth at bedtime as needed for sleep.       No current facility-administered medications for this encounter.   Filed Vitals:   04/29/13 0926  BP: 104/72  Pulse: 102  Resp: 22  Weight: 252 lb 4 oz (114.42 kg)  SpO2: 95%    PHYSICAL EXAM: General: NAD; chronically ill appearing. Dyspnea walking into the clinicc HEENT: normal  Neck: supple. JVP 8-9. Carotids 2+ bilat; no bruits. No lymphadenopathy or thryomegaly appreciated.  Cor: PMI laterally displaced. Regular S1 S2 with +S3 and 2/6 HSM apex. R upper chest hickman site with Milrinone infusing.  Lungs: clear Abdomen: soft, nontender, obese, +distended, No bruits or masses. Good bowel sounds.  Extremities: no cyanosis, clubbing, rash, no lower extremity edema  Neuro: alert & orientedx3, cranial nerves grossly intact. moves all 4 extremities w/o difficulty. Affect pleasant   EKG: NSR 98   ASSESSMENT & PLAN:   1) Chronic systolic HF: NICM, EF 15% (03/2012). He has been on home Milrinone 0.375 mcg over 1 year.    Currently NYHA IIIb/IV symptoms. He presents today for an acute acute work in with nausea, vomiting, and increased dyspnea with exertion.  Mild volume overload noted but this may represent low output heart failure.  Will give 80 mg IV lasix and 40 meq of potassium. Plan to continue 60 mg torsemide daily and 60 meq potassium twice a day.   - Continue Inspra 12.5 mg daily. Unable to tolerate Spironolactone due to gynecomastia. He is no longer on Imdur due to viagra  use.  - Continue milrinone 0.375 mcg through hickman cath - Remain on coreg 3.125 mg BID for now. If RHC worse tomorrow will ned to stop.   - No ACEI with CKD - Reinforced the need and importance of daily weights, a low sodium diet, and fluid restriction (less than 2 L a day). Instructed to call the HF clinic if weight increases more than 3 lbs overnight or 5 lbs in a week.  Will plan on   RHC tomorrow to reassess hemodynamics.  2) Atrial fibrillation/flutter: Paroxysmal -SR today . Continue eliquis 5 mg BID and amiodarone 200 mg twice a day. He is not having any bleeding issues. Check TSH and LFTs today.  3) CKD, stage IV -Check BMET today.   He was observed 2 hours in HF clinic while receiving IV lasix. As above RHC tomorrow tomorrow to further assess assess hemodynamics. Check pre cath labs this am.   Amy D Clegg NP-C  9:37 AM            

## 2013-04-30 NOTE — Interval H&P Note (Signed)
History and Physical Interval Note:  04/30/2013 2:23 PM  [name]  has presented today for surgery, with the diagnosis of hf  The various methods of treatment have been discussed with the patient and family. After consideration of risks, benefits and other options for treatment, the patient has consented to  Procedure(s): RIGHT HEART CATH (N/A) as a surgical intervention .  The patient's history has been reviewed, patient examined, no change in status, stable for surgery.  I have reviewed the patient's chart and labs.  Questions were answered to the patient's satisfaction.     Shaune Pascal Bensimhon

## 2013-04-30 NOTE — Progress Notes (Signed)
Pt. States took Eliquis yesterday 4/28; K+ 3.1 4/28, pt states he took "extra K+" yesterday; Evelene Croon, RN notified and will relay  message to Dr. Haroldine Laws.

## 2013-04-30 NOTE — CV Procedure (Signed)
Cardiac Cath Procedure Note:  Indication:  Heart failure  Procedures performed:  1) Right heart catheterization  Description of procedure:   The risks and indication of the procedure were explained. Consent was signed and placed on the chart. An appropriate timeout was taken prior to the procedure. The right neck was prepped and draped in the routine sterile fashion and anesthetized with 1% local lidocaine.   A 7 FR venous sheath was placed in the right internal jugular vein using a modified Seldinger technique. A standard Swan-Ganz catheter was used for the procedure.   Complications: None apparent.  Findings:  Done on milrinone 0.375 mcg/kg/min  RA = 4 RV = 20/1/3 PA = 23/8 (15) PCW = 5 Fick cardiac output/index =4.5/1.9 Thermo CO/CI = 3.9/1.6 PVR = FA sat = 98% PA sat = 62%, 64%  Assessment: 1. Low filling pressures with moderately reduced CO despite milrinone support  Plan/Discussion:  Continue medical therapy. Can cut back on diuretics as needed. Not candidate for VAD due to renal failure  Shaune Pascal BensimhonMD 2:47 PM

## 2013-05-02 ENCOUNTER — Encounter: Payer: PRIVATE HEALTH INSURANCE | Admitting: Internal Medicine

## 2013-05-05 ENCOUNTER — Telehealth (HOSPITAL_COMMUNITY): Payer: Self-pay | Admitting: Cardiology

## 2013-05-05 NOTE — Telephone Encounter (Signed)
Scott,RN [facility] -verbal to continue services -will pt need labs week of 5/4, pt had labs done 4/28 in office -Amiodarone co pay $259, pt will not be able to afford this - small ulcer seen under PICC line dressing(probably from scratching) Area cleaned, dressing left off area for air exposure, no drainage, RECOMMENDS Silver dressings,covered with PICC dressing x 2 weeks

## 2013-05-07 ENCOUNTER — Telehealth: Payer: Self-pay | Admitting: Licensed Clinical Social Worker

## 2013-05-07 NOTE — Telephone Encounter (Signed)
CSW referred to assist with obtaining a PCP. CSW contacted patient who reports he has no primary and has [employer]. He states that he has transportation and is open to first available appointment. CSW contacted a a few offices and able to obtain appointment at [facility] office on Monday May 18 at 11:15am. Patient appreciative and verbalizes understanding on paperwork needed and appointment time. CSW continues to be available as needed. Raquel Sarna, [place]

## 2013-05-12 ENCOUNTER — Telehealth (HOSPITAL_COMMUNITY): Payer: Self-pay | Admitting: Cardiology

## 2013-05-12 NOTE — Telephone Encounter (Signed)
[name] with [facility] called with concerns/update after [place] visit. Cp over the weekend radiating into L arm B/p 120/70 HR 72 regular Resp 20 O2 Sat 92-93% RA Weight 251 Pt did not seek medical treatment No change in milrinone dose per [facility] pharmacy MIld LE edema   Please advise further if needed

## 2013-05-12 NOTE — Telephone Encounter (Signed)
Pt is sch for f/u on 5/12, will address then

## 2013-05-13 ENCOUNTER — Encounter (HOSPITAL_COMMUNITY): Payer: Self-pay

## 2013-05-13 ENCOUNTER — Inpatient Hospital Stay (HOSPITAL_COMMUNITY): Admission: RE | Admit: 2013-05-13 | Payer: PRIVATE HEALTH INSURANCE | Source: Ambulatory Visit

## 2013-05-15 ENCOUNTER — Encounter (HOSPITAL_COMMUNITY): Payer: Self-pay

## 2013-05-15 ENCOUNTER — Ambulatory Visit (HOSPITAL_COMMUNITY)
Admission: RE | Admit: 2013-05-15 | Discharge: 2013-05-15 | Disposition: A | Discharge status: Home / Self Care | Payer: PRIVATE HEALTH INSURANCE | Source: Ambulatory Visit | Attending: Internal Medicine | Admitting: Internal Medicine

## 2013-05-15 ENCOUNTER — Ambulatory Visit (INDEPENDENT_AMBULATORY_CARE_PROVIDER_SITE_OTHER): Payer: PRIVATE HEALTH INSURANCE | Admitting: *Deleted

## 2013-05-15 ENCOUNTER — Encounter: Payer: Self-pay | Admitting: Internal Medicine

## 2013-05-15 VITALS — BP 111/80 | HR 93 | Resp 20 | Wt 257.2 lb

## 2013-05-15 DIAGNOSIS — I5022 Chronic systolic (congestive) heart failure: Secondary | ICD-10-CM | POA: Insufficient documentation

## 2013-05-15 DIAGNOSIS — I252 Old myocardial infarction: Secondary | ICD-10-CM | POA: Insufficient documentation

## 2013-05-15 DIAGNOSIS — I4892 Unspecified atrial flutter: Secondary | ICD-10-CM | POA: Insufficient documentation

## 2013-05-15 DIAGNOSIS — I4891 Unspecified atrial fibrillation: Secondary | ICD-10-CM

## 2013-05-15 DIAGNOSIS — N184 Chronic kidney disease, stage 4 (severe): Secondary | ICD-10-CM | POA: Insufficient documentation

## 2013-05-15 DIAGNOSIS — I428 Other cardiomyopathies: Secondary | ICD-10-CM | POA: Insufficient documentation

## 2013-05-15 DIAGNOSIS — I420 Dilated cardiomyopathy: Secondary | ICD-10-CM

## 2013-05-15 DIAGNOSIS — R072 Precordial pain: Secondary | ICD-10-CM

## 2013-05-15 DIAGNOSIS — N189 Chronic kidney disease, unspecified: Secondary | ICD-10-CM

## 2013-05-15 DIAGNOSIS — I2510 Atherosclerotic heart disease of native coronary artery without angina pectoris: Secondary | ICD-10-CM | POA: Insufficient documentation

## 2013-05-15 DIAGNOSIS — I509 Heart failure, unspecified: Secondary | ICD-10-CM

## 2013-05-15 DIAGNOSIS — E876 Hypokalemia: Secondary | ICD-10-CM

## 2013-05-15 DIAGNOSIS — Z95810 Presence of automatic (implantable) cardiac defibrillator: Secondary | ICD-10-CM | POA: Insufficient documentation

## 2013-05-15 DIAGNOSIS — I129 Hypertensive chronic kidney disease with stage 1 through stage 4 chronic kidney disease, or unspecified chronic kidney disease: Secondary | ICD-10-CM | POA: Insufficient documentation

## 2013-05-15 DIAGNOSIS — R0789 Other chest pain: Secondary | ICD-10-CM

## 2013-05-15 DIAGNOSIS — G8929 Other chronic pain: Secondary | ICD-10-CM | POA: Insufficient documentation

## 2013-05-15 MED ORDER — OXYCODONE-ACETAMINOPHEN 5-325 MG PO TABS: 2.0000 | ORAL_TABLET | ORAL | Status: DC | PRN

## 2013-05-15 NOTE — Patient Instructions (Signed)
Will discuss with Dr. Haroldine Laws about your case.  Go home and think about whether you want to have your ICD turned off, if so call me.  Follow up 2 weeks with Dr. Haroldine Laws.  Do not take any metolazone right now unless you gain more than 5 lbs in a day.  Do the following things EVERYDAY: 1) Weigh yourself in the morning before breakfast. Write it down and keep it in a log. 2) Take your medicines as prescribed 3) Eat low salt foods-Limit salt (sodium) to 2000 mg per day.  4) Stay as active as you can everyday 5) Limit all fluids for the day to less than 2 liters

## 2013-05-15 NOTE — Progress Notes (Signed)
Patient ID: [name], male   DOB: [birth year], 63 y.o.   MRN: [id number]  PCP: Pending [facility]  HPI: [name] is a 63 y.o. gentlemen with severe HF due to NICM (EF 20-25%) with multiple hospital admissions for HF exacerbations. He also has history of ventricular tachycardia s/p [employer] ICD by [name], CKD: stage IV, and paroxysmal atrial arrhythmias on amiodarone as well as sarcoidosis and hypertension. Cath in 2008 by [name] showed no CAD.Seen at [facility] and not felt to be a transplant candidate due to renal failure and lack of family support.  Milrinone initiated in January 2014 for low output. 03/01/12 milrinone increased 0.389mc/kg/min.   ECHO 03/22/12 EF 15%   04/26/12 S/P Successful DC-CV for atrial fibrillation. Remains on amiodarone.   RHC 04/30/13 RA = 4  RV = 20/1/3  PA = 23/8 (15)  PCW = 5  Fick cardiac output/index =4.5/1.9  Thermo CO/CI = 3.9/1.6  PVR =  FA sat = 98%  PA sat = 62%, 64% - On milrinone 0.25  Follow up: Since last visit he had heart cath showing low filling pressures with moderately reduced CO despite milrinone support. Complaints of CP constant that goes down L arm and he has pain throughout whole body. Wearing O2 constantly. Complaints of SOB with minimal activity and orthopnea. Weight at home 252 lbs. Remains on home milrinone 0.25 and followed by [facility]. No appetite. Hospice discharged patient. Following a low salt diet and drinking less than 2L a day.   He is not on an ace inhibitor due to elevated creatinine. Not on nitrates due to Viagra use.   08/15/12 Creatinine 4.24 Potassium 3.3 09/19/12 Creatinine 3.98, Potassium 3.1, BUN 78 10/07/12 Creatinine 2.62, Potassium 3.4 10/18/12 Creatinine 3.3, K+3.1 11/08/12 Creatinine 2.15 K 4.0 11/25/12 Creatinine 2.93, K+ 3.5 03/10/13: K+ 2.9, Creatinine 2.63 04/30/13: K+ 3.7, creatinine 2.4  ROS: All systems negative except as listed in HPI, PMH and Problem List.  Past Medical History  Diagnosis Date   . CHF (congestive heart failure)   . Sarcoidosis   . Cardiomyopathy, dilated, nonischemic     non ischemic by cath  . Acute on chronic systolic heart failure   . Automatic implantable cardiac defibrillator in situ   . Atrial fibrillation   . NSVT (nonsustained ventricular tachycardia)   . GERD (gastroesophageal reflux disease)   . Hypercholesteremia   . Myocardial infarction   . Shortness of breath   . Chronic kidney disease (CKD), stage III (moderate)   . Pacemaker   . Anginal pain   . Gout   . Hypertension     [name]  . Coronary artery disease    Current Outpatient Prescriptions  Medication Sig Dispense Refill  . allopurinol (ZYLOPRIM) 100 MG tablet Take 1 tablet (100 mg total) by mouth daily.  30 tablet  6  . ALPRAZolam (XANAX) 0.5 MG tablet Take 0.5 mg by mouth 3 (three) times daily as needed for anxiety.      Marland Kitchen amiodarone (PACERONE) 200 MG tablet Take 200 mg by mouth 2 (two) times daily.      . carvedilol (COREG) 3.125 MG tablet Take 3.125 mg by mouth 2 (two) times daily with a meal.      . eplerenone (INSPRA) 25 MG tablet Take 0.5 tablets (12.5 mg total) by mouth daily.  15 tablet  6  . esomeprazole (NEXIUM) 40 MG capsule Take 1 capsule (40 mg total) by mouth daily.  30 capsule  3  .  fluticasone (FLONASE) 50 MCG/ACT nasal spray Place 2 sprays into both nostrils daily as needed for allergies.       . hydrOXYzine (ATARAX/VISTARIL) 25 MG tablet Take 25 mg by mouth 3 (three) times daily as needed for itching.      Marland Kitchen LORazepam (ATIVAN) 0.5 MG tablet Take 1-2 tablets (0.5-1 mg total) by mouth 2 (two) times daily as needed for anxiety.  50 tablet  0  . metolazone (ZAROXOLYN) 2.5 MG tablet Take 1 tablet (2.5 mg total) by mouth daily as needed (for fluid retention).  10 tablet  1  . milrinone (PRIMACOR) 20 MG/100ML SOLN infusion Inject 37.3125 mcg/min into the vein continuous.  100 mL    . potassium chloride SA (K-DUR,KLOR-CON) 20 MEQ tablet Take 60 mEq by mouth 2 (two) times  daily.      . promethazine (PHENERGAN) 25 MG tablet Take 25 mg by mouth every 6 (six) hours as needed for nausea or vomiting.      . ranitidine (ZANTAC) 150 MG capsule Take 150 mg by mouth 2 (two) times daily.      Marland Kitchen senna-docusate (SENOKOT-S) 8.6-50 MG per tablet Take 1-2 tablets by mouth 2 (two) times daily as needed for mild constipation or moderate constipation.      . sertraline (ZOLOFT) 50 MG tablet Take 50 mg by mouth daily.      . sildenafil (VIAGRA) 100 MG tablet Take 1 tablet (100 mg total) by mouth daily as needed for erectile dysfunction.  4 tablet  1  . torsemide (DEMADEX) 20 MG tablet Take 60 mg by mouth daily.      Marland Kitchen zolpidem (AMBIEN) 5 MG tablet Take 5-10 mg by mouth at bedtime as needed for sleep.       Marland Kitchen apixaban (ELIQUIS) 5 MG TABS tablet Take 1 tablet (5 mg total) by mouth 2 (two) times daily.  60 tablet  4   No current facility-administered medications for this encounter.   Filed Vitals:   05/15/13 0923  BP: 111/80  Pulse: 93  Resp: 20  Weight: 257 lb 4 oz (116.688 kg)  SpO2: 96%    PHYSICAL EXAM: General: NAD; chronically ill appearing. NAD HEENT: normal  Neck: supple. JVP flat. Carotids 2+ bilat; no bruits. No lymphadenopathy or thryomegaly appreciated.  Cor: PMI laterally displaced. Regular S1 S2 with +S3 and 2/6 HSM apex. R upper chest hickman site with Milrinone infusing.  Lungs: clear Abdomen: soft, nontender, obese, +distended, No bruits or masses. Good bowel sounds.  Extremities: no cyanosis, clubbing, rash, no lower extremity edema  Neuro: alert & orientedx3, cranial nerves grossly intact. moves all 4 extremities w/o difficulty. Affect pleasant   ASSESSMENT & PLAN:   1) Chronic systolic HF: NICM, EF 10% (03/2012). He has been on home Milrinone 0.375 mcg over 1 year.   - Currently NYHA IV symptoms and volume status stable. Difficult to assess volume status, however just had RHC with normal filling pressures. Will continue torsemide 60 mg daily and  insturcted to take metolazone only if he gains more than 5 lbs in a day.  - Continue low dose coreg 3.125 mg BID - He is on Inspra for K+ sparing, will continue - Continue milrinone 0.375 mcg through hickman cath - No ACEI with CKD - Lengthy discussion with patient about his prognosis, concerned that he is nearing the end of life. He was discharged from Hospice a couple weeks ago and will discuss with Darrell. Haroldine Laws if he thinks we can enroll him. Discussed  with him about turning ICD off because I am concerned that with his heart getting weaker that he may have a lethal rhythm and do not want him to get multiple shocks. He said he would think about this. - Reinforced the need and importance of daily weights, a low sodium diet, and fluid restriction (less than 2 L a day). Instructed to call the HF clinic if weight increases more than 3 lbs overnight or 5 lbs in a week.  Will plan on RHC tomorrow to reassess hemodynamics.  2) Atrial fibrillation/flutter: Paroxysmal -Appears to be in NSR today. No bleeding. Continue eliquis 5 mg  BID. Continue amiodarone 200 mg BID.  3) CKD, stage IV -Cr has been stable. 4) Chronic pain - Patient was on multiple pain medications when he was Hospice patient, however was discharged with no follow up or PCP. We have helped him find PCP. Will provided prescription for oxycodone 5-325 mg (2 tablets) every 4 hr PRN #45 no refills.   F/U 2 weeks with MD Rande Brunt NP-C  9:42 AM

## 2013-05-19 ENCOUNTER — Ambulatory Visit (INDEPENDENT_AMBULATORY_CARE_PROVIDER_SITE_OTHER): Payer: PRIVATE HEALTH INSURANCE | Admitting: Family

## 2013-05-19 ENCOUNTER — Encounter: Payer: Self-pay | Admitting: Family

## 2013-05-19 ENCOUNTER — Telehealth: Payer: Self-pay | Admitting: *Deleted

## 2013-05-19 VITALS — BP 102/86 | HR 76 | Temp 97.9°F | Resp 18 | Ht 75.0 in | Wt 250.1 lb

## 2013-05-19 DIAGNOSIS — G894 Chronic pain syndrome: Secondary | ICD-10-CM

## 2013-05-19 DIAGNOSIS — N184 Chronic kidney disease, stage 4 (severe): Secondary | ICD-10-CM

## 2013-05-19 DIAGNOSIS — I5023 Acute on chronic systolic (congestive) heart failure: Secondary | ICD-10-CM

## 2013-05-19 DIAGNOSIS — N189 Chronic kidney disease, unspecified: Secondary | ICD-10-CM

## 2013-05-19 DIAGNOSIS — M109 Gout, unspecified: Secondary | ICD-10-CM | POA: Insufficient documentation

## 2013-05-19 DIAGNOSIS — F418 Other specified anxiety disorders: Secondary | ICD-10-CM | POA: Insufficient documentation

## 2013-05-19 DIAGNOSIS — F341 Dysthymic disorder: Secondary | ICD-10-CM

## 2013-05-19 DIAGNOSIS — I509 Heart failure, unspecified: Secondary | ICD-10-CM

## 2013-05-19 MED ORDER — LORAZEPAM 0.5 MG PO TABS: 0.5000 mg | ORAL_TABLET | Freq: Two times a day (BID) | ORAL | Status: DC | PRN

## 2013-05-19 MED ORDER — OXYCODONE HCL 20 MG PO TABS: 1.0000 | ORAL_TABLET | Freq: Three times a day (TID) | ORAL | Status: DC | PRN

## 2013-05-19 MED ORDER — ZOLPIDEM TARTRATE 5 MG PO TABS: 5.0000 mg | ORAL_TABLET | Freq: Once | ORAL | Status: DC

## 2013-05-19 NOTE — Assessment & Plan Note (Signed)
Will refer to nephrology.

## 2013-05-19 NOTE — Assessment & Plan Note (Signed)
Stable on zoloft 

## 2013-05-19 NOTE — Progress Notes (Signed)
Pre visit review using our clinic review tool, if applicable. No additional management support is needed unless otherwise documented below in the visit note. 

## 2013-05-19 NOTE — Assessment & Plan Note (Signed)
Fair control with allopurinol, continue same.

## 2013-05-19 NOTE — Patient Instructions (Signed)
Stop percocet. Start oxycodone 20mg  3 times daily as needed for pain. You will be contacted about your referral to pain clinic and the kidney doctor. Follow up in 1 month for medicare wellness visit.

## 2013-05-19 NOTE — Progress Notes (Signed)
Subjective:    Patient ID: [name], male    DOB: 1950/09/21, 63 y.o.   MRN: [id number]  HPI  [name] is a 63 yr old male who presents today to establish care. Chart is reviewed.  He has multiple medical problems including chronic systolic heart failure- last EF 3/14 was 15%.  He is followed by Dr. Sung Amabile and is maintained on milrinone. He has an ICD.  Discussion was had this week with cardiology about the possibility of turning off his icd.  He is considering this. He has AF/flutter and is on amiodarone and eliquis.  CKD-  Stage IV- he has not seen a nephrologist in the past.   Lab Results  Component Value Date   CREATININE 2.40* 04/30/2013   Chronic pain- he reports chronic left sided chest pain.  Pain is located in his chest and radiates down into leg pain as well.  He reports that hospice had him on oxycodone 20mg  twice daily for several months.  No improvement in the pain in his legs.    Depression/anxiety- on sertraline. Reports that hospice started this medication.  He reports only occasional panic attacks.    Gout- reports hx of bad gout flares.  Shrimp flares it up.  Reports that his flare ups are about once every 3-4 months.   History of cocaine abuse- reports back in 2004, crack.  Was incarcerated and quit in prior to this.    Hyperlipidemia-   Lab Results  Component Value Date   CHOL 144 10/09/2011   HDL 70 10/09/2011   LDLCALC 55 10/09/2011   TRIG 96 10/09/2011   CHOLHDL 2.1 10/09/2011       Review of Systems See HPI  Past Medical History  Diagnosis Date  . CHF (congestive heart failure)   . Sarcoidosis   . Cardiomyopathy, dilated, nonischemic     non ischemic by cath  . Acute on chronic systolic heart failure   . Automatic implantable cardiac defibrillator in situ   . Atrial fibrillation   . NSVT (nonsustained ventricular tachycardia)   . GERD (gastroesophageal reflux disease)   . Hypercholesteremia   . Myocardial infarction   . Shortness of  breath   . Chronic kidney disease (CKD), stage III (moderate)   . Pacemaker   . Anginal pain   . Gout   . Hypertension     dr [name]  . Coronary artery disease     History   Social History  . Marital Status: Divorced    Spouse Name: N/A    Number of Children: N/A  . Years of Education: N/A   Occupational History  . Not on file.   Social History Main Topics  . Smoking status: Never Smoker   . Smokeless tobacco: Never Used  . Alcohol Use: No  . Drug Use: Yes    Special: "Crack" cocaine, Marijuana, Cocaine     Comment: last use Novemenber  . Sexual Activity: Not Currently   Other Topics Concern  . Not on file   Social History Narrative   3 sons- one in [place], 1 in [place], on in [place]   Retired from post office- on disability   Completed Bachelors from [employer]    Past Surgical History  Procedure Laterality Date  . Back surgery  1987    Ruptured disk repair  . Pacemaker insertion      with ICD  . Tee without cardioversion  01/17/2011    Procedure: TRANSESOPHAGEAL ECHOCARDIOGRAM (TEE);  Surgeon: Birdie Riddle, MD;  Location: [place];  Service: Cardiovascular;  Laterality: N/A;  . Cardioversion  01/17/2011    Procedure: CARDIOVERSION;  Surgeon: Birdie Riddle, MD;  Location: [place];  Service: Cardiovascular;  Laterality: N/A;  . Cardiac catheterization    . Insert / replace / remove pacemaker    . Anterior cervical decomp/discectomy fusion  08/21/2011    Procedure: ANTERIOR CERVICAL DECOMPRESSION/DISCECTOMY FUSION 2 LEVELS;  Surgeon: Marybelle Killings, MD;  Location: [place];  Service: Orthopedics;  Laterality: N/A;  C5-6, C6-7 Anterior Cervical Discectomy and Fusion, allograft, plate  . Tee without cardioversion N/A 02/16/2012    Procedure: TRANSESOPHAGEAL ECHOCARDIOGRAM (TEE);  Surgeon: Jolaine Artist, MD;  Location: [facility] ENDOSCOPY;  Service: Cardiovascular;  Laterality: N/A;  original case scheduled under his dad (who is deceased), rescheduled under correct mrn/pt/dob.  [place]/dl  . Tee without cardioversion N/A 03/22/2012    Procedure: TRANSESOPHAGEAL ECHOCARDIOGRAM (TEE);  Surgeon: Lelon Perla, MD;  Location: [place];  Service: Cardiovascular;  Laterality: N/A;  . Cardioversion N/A 03/22/2012    Procedure: CARDIOVERSION;  Surgeon: Lelon Perla, MD;  Location: [facility] ENDOSCOPY;  Service: Cardiovascular;  Laterality: N/A;  . Cardioversion N/A 04/26/2012    Procedure: CARDIOVERSION;  Surgeon: Jolaine Artist, MD;  Location: [facility] ENDOSCOPY;  Service: Cardiovascular;  Laterality: N/A;  . Central venous catheter tunneled insertion single lumen  09/16/2012    right IJ  . Cardioversion N/A 11/08/2012    Procedure: CARDIOVERSION;  Surgeon: Jolaine Artist, MD;  Location: [facility] ENDOSCOPY;  Service: Cardiovascular;  Laterality: N/A;    Family History  Problem Relation Age of Onset  . Heart disease    . Heart failure    . Stroke    . Anesthesia problems Neg Hx   . Hypotension Neg Hx   . Malignant hyperthermia Neg Hx   . Pseudochol deficiency Neg Hx     Allergies  Allergen Reactions  . Nitroglycerin Other (See Comments)    "feels like head is going to bust open"    Current Outpatient Prescriptions on File Prior to Visit  Medication Sig Dispense Refill  . allopurinol (ZYLOPRIM) 100 MG tablet Take 1 tablet (100 mg total) by mouth daily.  30 tablet  6  . ALPRAZolam (XANAX) 0.5 MG tablet Take 0.5 mg by mouth 3 (three) times daily as needed for anxiety.      Marland Kitchen amiodarone (PACERONE) 200 MG tablet Take 200 mg by mouth 2 (two) times daily.      Marland Kitchen apixaban (ELIQUIS) 5 MG TABS tablet Take 1 tablet (5 mg total) by mouth 2 (two) times daily.  60 tablet  4  . carvedilol (COREG) 3.125 MG tablet Take 3.125 mg by mouth 2 (two) times daily with a meal.      . eplerenone (INSPRA) 25 MG tablet Take 0.5 tablets (12.5 mg total) by mouth daily.  15 tablet  6  . esomeprazole (NEXIUM) 40 MG capsule Take 1 capsule (40 mg total) by mouth daily.  30 capsule  3  . fluticasone  (FLONASE) 50 MCG/ACT nasal spray Place 2 sprays into both nostrils daily as needed for allergies.       . hydrOXYzine (ATARAX/VISTARIL) 25 MG tablet Take 25 mg by mouth 3 (three) times daily as needed for itching.      Marland Kitchen LORazepam (ATIVAN) 0.5 MG tablet Take 1-2 tablets (0.5-1 mg total) by mouth 2 (two) times daily as needed for anxiety.  50 tablet  0  .  metolazone (ZAROXOLYN) 2.5 MG tablet Take 1 tablet (2.5 mg total) by mouth daily as needed (for fluid retention).  10 tablet  1  . milrinone (PRIMACOR) 20 MG/100ML SOLN infusion Inject 37.3125 mcg/min into the vein continuous.  100 mL    . oxyCODONE-acetaminophen (ROXICET) 5-325 MG per tablet Take 2 tablets by mouth every 4 (four) hours as needed for severe pain.  45 tablet  0  . potassium chloride SA (K-DUR,KLOR-CON) 20 MEQ tablet Take 60 mEq by mouth 2 (two) times daily.      . promethazine (PHENERGAN) 25 MG tablet Take 25 mg by mouth every 6 (six) hours as needed for nausea or vomiting.      . ranitidine (ZANTAC) 150 MG capsule Take 150 mg by mouth 2 (two) times daily.      Marland Kitchen senna-docusate (SENOKOT-S) 8.6-50 MG per tablet Take 1-2 tablets by mouth 2 (two) times daily as needed for mild constipation or moderate constipation.      . sertraline (ZOLOFT) 50 MG tablet Take 50 mg by mouth daily.      . sildenafil (VIAGRA) 100 MG tablet Take 1 tablet (100 mg total) by mouth daily as needed for erectile dysfunction.  4 tablet  1  . torsemide (DEMADEX) 20 MG tablet Take 60 mg by mouth daily.      Marland Kitchen zolpidem (AMBIEN) 5 MG tablet Take 5-10 mg by mouth at bedtime as needed for sleep.        No current facility-administered medications on file prior to visit.    BP 102/86  Pulse 76  Temp(Src) 97.9 F (36.6 C) (Oral)  Resp 18  Ht 6\' 3"  (1.905 m)  Wt 250 lb 1.3 oz (113.436 kg)  BMI 31.26 kg/m2  SpO2 94%       Objective:   Physical Exam  Constitutional: He is oriented to person, place, and time. He appears well-developed and well-nourished.    HENT:  Head: Normocephalic and atraumatic.  Cardiovascular: Normal rate and regular rhythm.   No murmur heard. Pulmonary/Chest: Effort normal and breath sounds normal. No respiratory distress. He has no wheezes. He has no rales. He exhibits no tenderness.  Musculoskeletal:  1+ bilateral edema Anterior chest wall is tender to palation  Neurological: He is alert and oriented to person, place, and time.  Bilateral straight leg raise negative.   Psychiatric: He has a normal mood and affect. His behavior is normal. Judgment and thought content normal.          Assessment & Plan:

## 2013-05-19 NOTE — Assessment & Plan Note (Signed)
Clinically euvolemic. Management per cardiology.

## 2013-05-19 NOTE — Assessment & Plan Note (Signed)
Advised pt that I would write his meds until he can get in with pain management.  Advised him to stop percocet and can restart oxycodone.  Advised him to discontinue percocet. A pain contract was signed today.

## 2013-05-19 NOTE — Telephone Encounter (Signed)
Pt left message that [facility] will not be able to get Oxycodone until Friday. Pt wants to know if he can get Rx filled at a different pharmacy so he doesn't have to wait? Attempted to reach pt and left message for him to return my call.

## 2013-05-20 LAB — MDC_IDC_ENUM_SESS_TYPE_INCLINIC
Battery Remaining Longevity: 27.6 mo
Battery Voltage: 2.56 V
Brady Statistic RV Percent Paced: 0.01 %
Date Time Interrogation Session: 20150514153126
HIGH POWER IMPEDANCE MEASURED VALUE: 52.2422
Implantable Pulse Generator Serial Number: 521670
Lead Channel Impedance Value: 575 Ohm
Lead Channel Pacing Threshold Amplitude: 1 V
Lead Channel Pacing Threshold Amplitude: 1 V
Lead Channel Sensing Intrinsic Amplitude: 11.5 mV
Lead Channel Setting Pacing Amplitude: 2.5 V
Lead Channel Setting Pacing Pulse Width: 0.6 ms
MDC IDC MSMT LEADCHNL RV PACING THRESHOLD PULSEWIDTH: 0.6 ms
MDC IDC MSMT LEADCHNL RV PACING THRESHOLD PULSEWIDTH: 0.6 ms
MDC IDC SET LEADCHNL RV SENSING SENSITIVITY: 0.3 mV
Zone Setting Detection Interval: 250 ms
Zone Setting Detection Interval: 300 ms

## 2013-05-20 NOTE — Telephone Encounter (Signed)
Spoke with pt. He states he currently has enough medication on hand to last until Friday and will get it at his designated pharmacy.

## 2013-05-20 NOTE — Progress Notes (Signed)
ICD check in clinic. Normal device function. Threshold and sensing consistent with previous device measurements. Impedance trends stable over time. No evidence of any ventricular arrhythmias. Histogram distribution appropriate for patient and level of activity. No changes made this session. Device programmed at appropriate safety margins. Device programmed to optimize intrinsic conduction. Estimated longevity 2.3 years. Pt will follow up with GTon 8-17 @ 9:30am.

## 2013-05-22 IMAGING — CR DG CHEST 2V
2 series · 2 of 2 positions shown · non-contrast
Comparison: 11/13/2011.

CLINICAL DATA: Shortness of breath.

CHEST - 2 VIEW

[w chest lat]
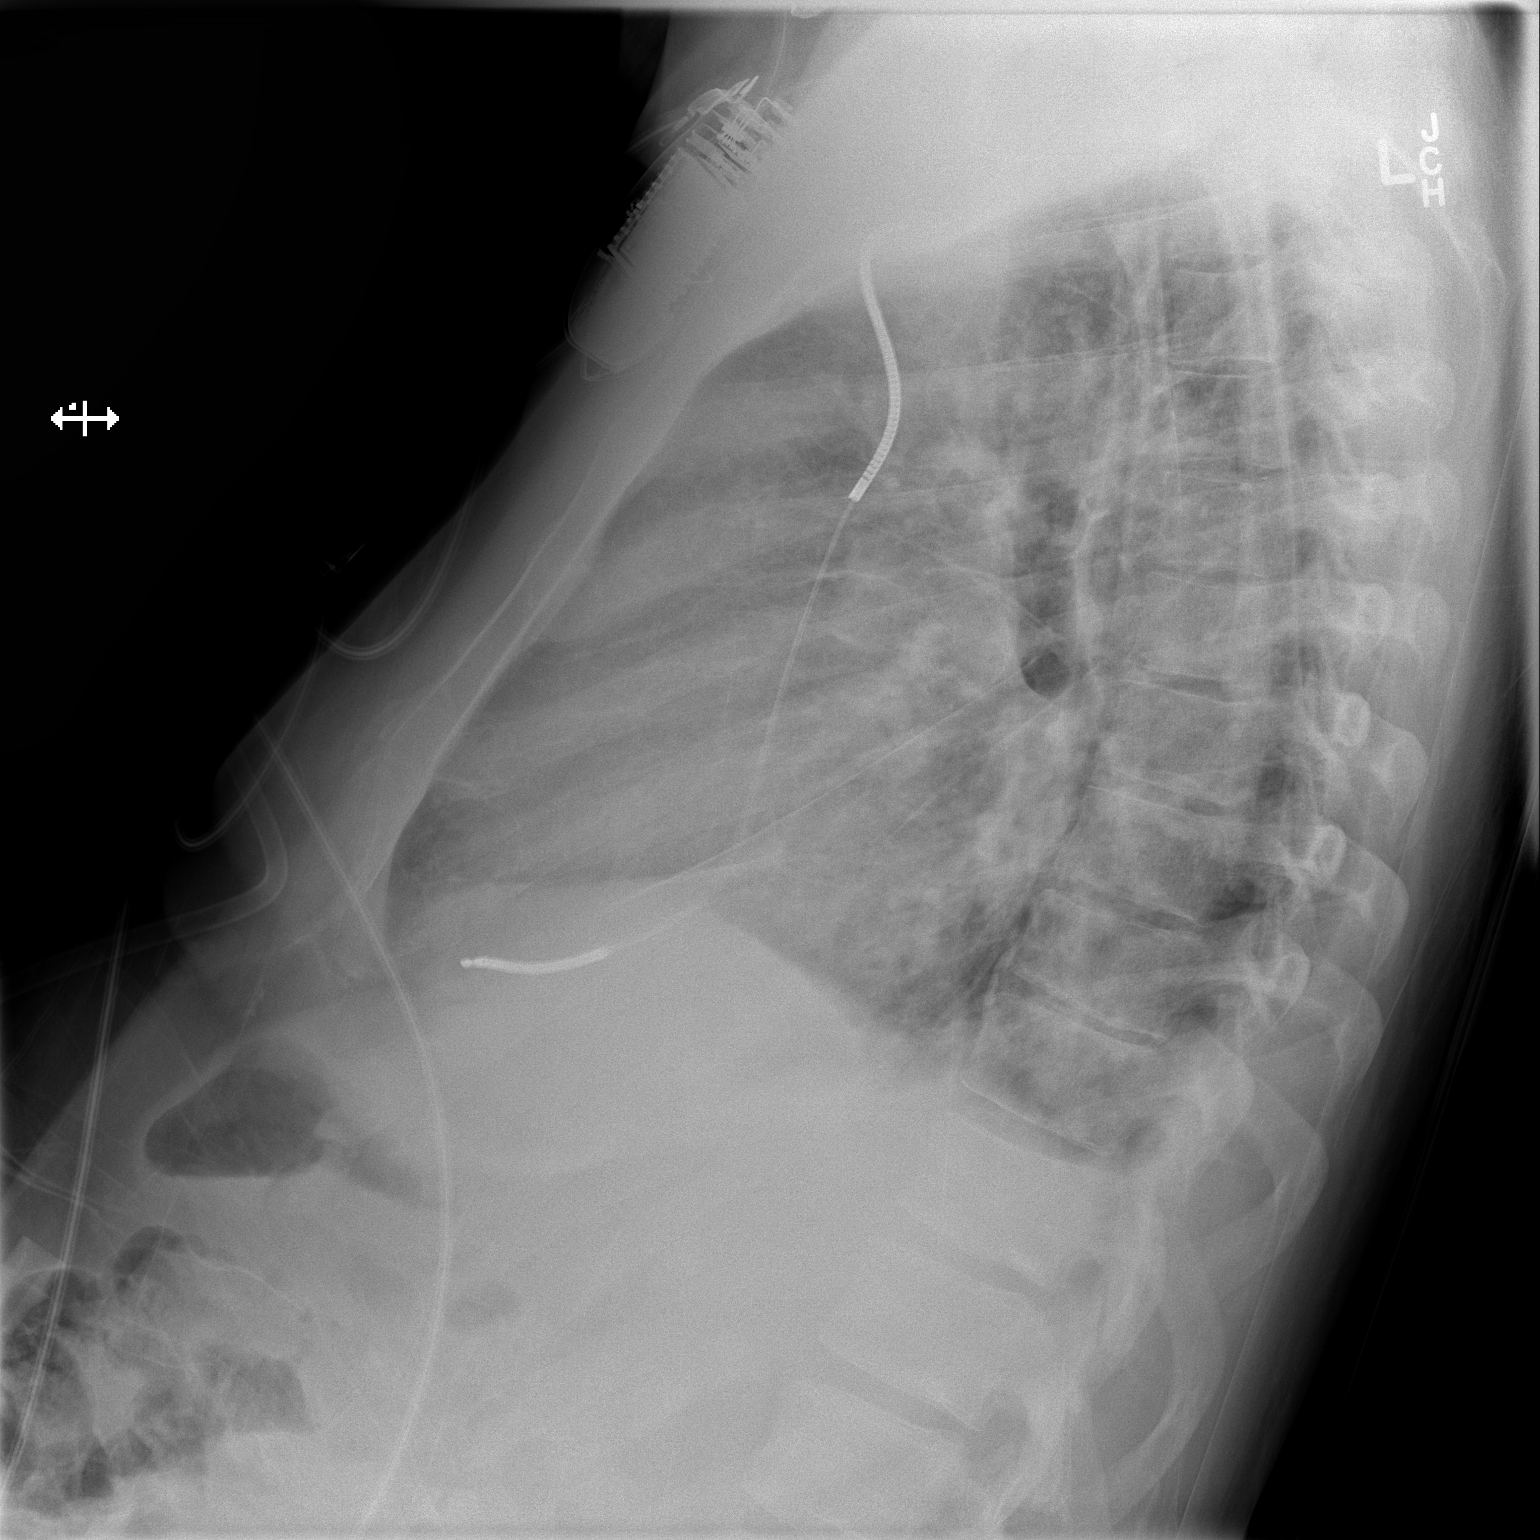

[x chest ap]
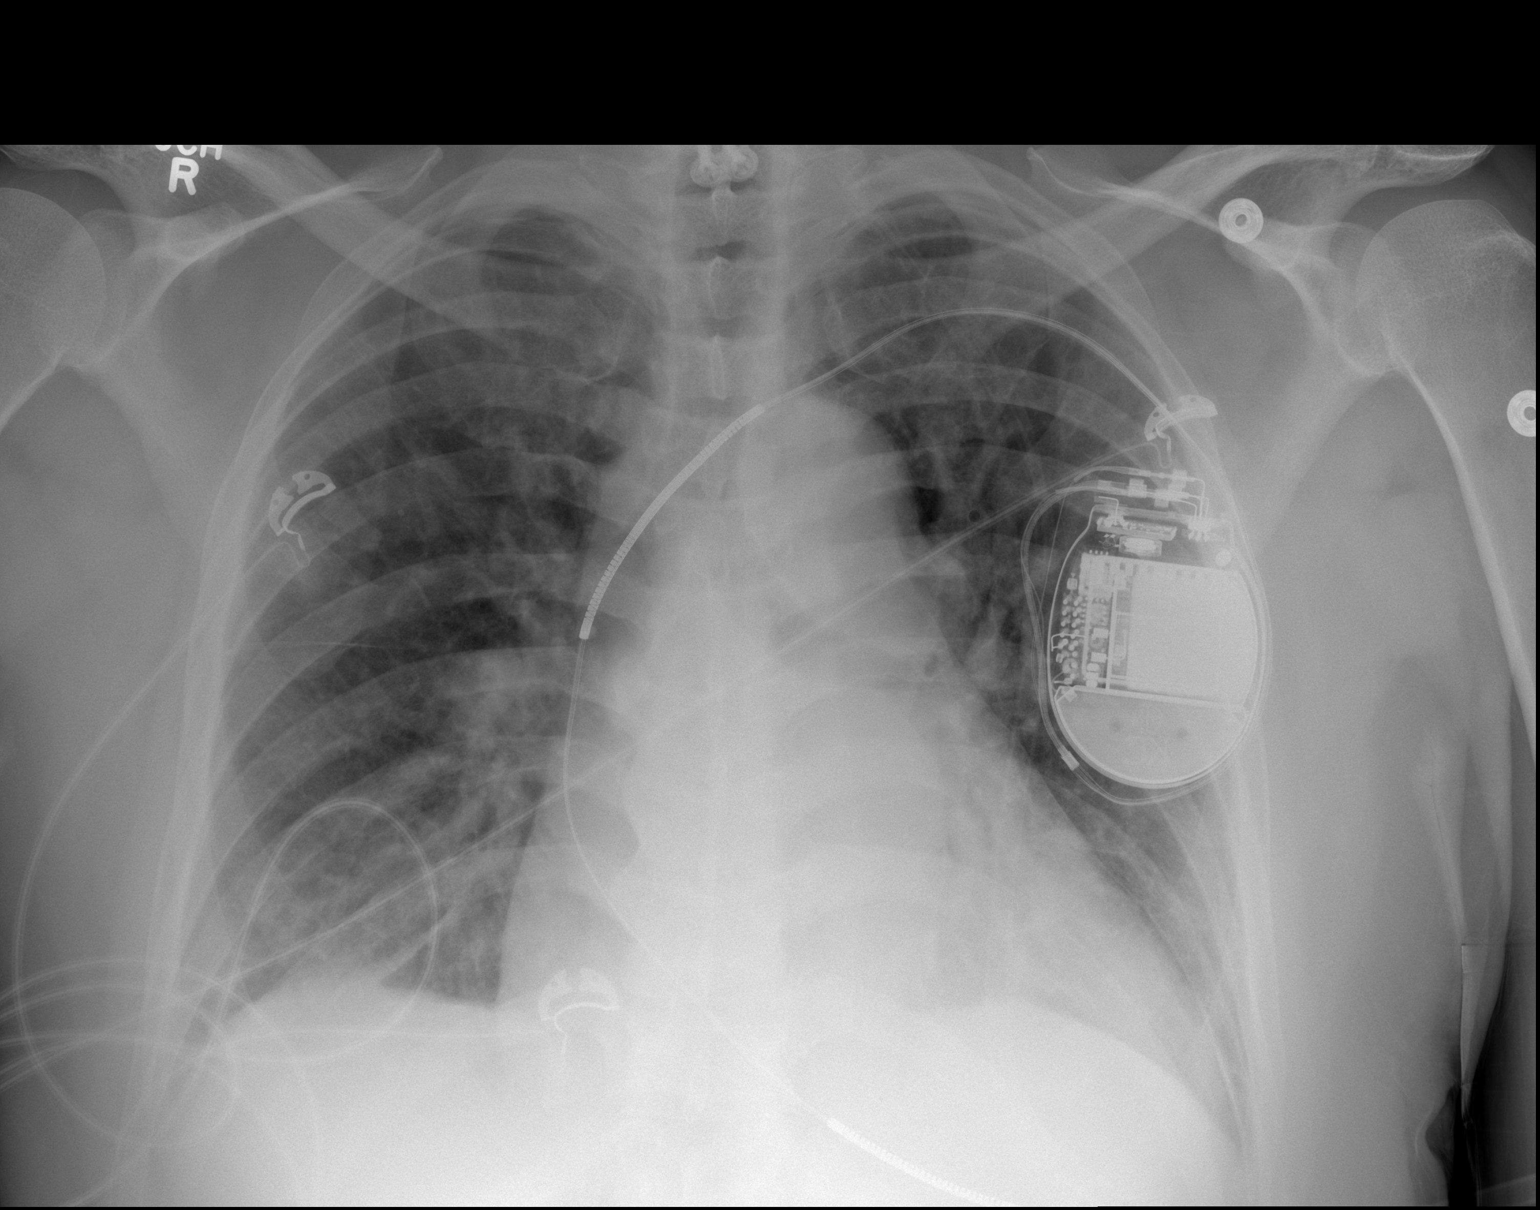

[2 of 2 positions shown; findings below may reference images not displayed]

FINDINGS: The heart is borderline enlarged.  The right ventricular
pacer wire / AICD is unchanged.  There is vascular congestion and
pulmonary edema consistent with CHF.  No definite pleural
effusions.
IMPRESSION: CHF.

## 2013-05-28 ENCOUNTER — Ambulatory Visit
Admission: RE | Admit: 2013-05-28 | Discharge: 2013-05-28 | Disposition: A | Discharge status: Home / Self Care | Payer: Medicare Other | Source: Ambulatory Visit | Attending: Physician Assistant | Admitting: Physician Assistant

## 2013-05-28 ENCOUNTER — Ambulatory Visit (HOSPITAL_COMMUNITY)
Admission: RE | Admit: 2013-05-28 | Discharge: 2013-05-28 | Disposition: A | Discharge status: Home / Self Care | Payer: PRIVATE HEALTH INSURANCE | Source: Ambulatory Visit | Attending: Internal Medicine | Admitting: Internal Medicine

## 2013-05-28 ENCOUNTER — Other Ambulatory Visit: Payer: Self-pay | Admitting: Physician Assistant

## 2013-05-28 VITALS — BP 114/77 | HR 92 | Resp 18 | Wt 254.0 lb

## 2013-05-28 DIAGNOSIS — I428 Other cardiomyopathies: Secondary | ICD-10-CM | POA: Insufficient documentation

## 2013-05-28 DIAGNOSIS — N184 Chronic kidney disease, stage 4 (severe): Secondary | ICD-10-CM | POA: Insufficient documentation

## 2013-05-28 DIAGNOSIS — I509 Heart failure, unspecified: Secondary | ICD-10-CM

## 2013-05-28 DIAGNOSIS — D869 Sarcoidosis, unspecified: Secondary | ICD-10-CM | POA: Insufficient documentation

## 2013-05-28 DIAGNOSIS — C001 Malignant neoplasm of external lower lip: Secondary | ICD-10-CM | POA: Insufficient documentation

## 2013-05-28 DIAGNOSIS — E7800 Pure hypercholesterolemia, unspecified: Secondary | ICD-10-CM | POA: Insufficient documentation

## 2013-05-28 DIAGNOSIS — I5022 Chronic systolic (congestive) heart failure: Secondary | ICD-10-CM | POA: Insufficient documentation

## 2013-05-28 DIAGNOSIS — Z95810 Presence of automatic (implantable) cardiac defibrillator: Secondary | ICD-10-CM | POA: Insufficient documentation

## 2013-05-28 DIAGNOSIS — K219 Gastro-esophageal reflux disease without esophagitis: Secondary | ICD-10-CM | POA: Insufficient documentation

## 2013-05-28 DIAGNOSIS — I129 Hypertensive chronic kidney disease with stage 1 through stage 4 chronic kidney disease, or unspecified chronic kidney disease: Secondary | ICD-10-CM | POA: Insufficient documentation

## 2013-05-28 DIAGNOSIS — I4892 Unspecified atrial flutter: Secondary | ICD-10-CM | POA: Insufficient documentation

## 2013-05-28 DIAGNOSIS — M545 Low back pain, unspecified: Secondary | ICD-10-CM

## 2013-05-28 DIAGNOSIS — N189 Chronic kidney disease, unspecified: Secondary | ICD-10-CM

## 2013-05-28 DIAGNOSIS — I498 Other specified cardiac arrhythmias: Secondary | ICD-10-CM | POA: Insufficient documentation

## 2013-05-28 DIAGNOSIS — I4891 Unspecified atrial fibrillation: Secondary | ICD-10-CM | POA: Insufficient documentation

## 2013-05-28 DIAGNOSIS — M109 Gout, unspecified: Secondary | ICD-10-CM | POA: Insufficient documentation

## 2013-05-28 DIAGNOSIS — Z7901 Long term (current) use of anticoagulants: Secondary | ICD-10-CM | POA: Insufficient documentation

## 2013-05-28 NOTE — Progress Notes (Signed)
Patient ID: HAVARD [name], male   DOB: [birth year], 63 y.o.   MRN: [id number]  PCP: [facility]ending [facility]  HPI: [name] is a 63 y.o. gentlemen with severe HF due to NICM (EF 20-25%) with multiple hospital admissions for HF exacerbations. He also has history of ventricular tachycardia s/p [employer] Jude ICD by Dr. Lovena Le, CKD: stage IV, and paroxysmal atrial arrhythmias on amiodarone as well as sarcoidosis and hypertension. Cath in 2008 by Dr. Terrence Dupont showed no CAD.Seen at [facility] and not felt to be a transplant candidate due to renal failure and lack of family support.  Milrinone initiated in January 2014 for low output. 03/01/12 milrinone increased 0.370mc/kg/min.   ECHO 03/22/12 EF 15%   04/26/12 S/P Successful DC-CV for atrial fibrillation. Remains on amiodarone.   RHC 04/30/13 RA = 4  RV = 20/1/3  PA = 23/8 (15)  PCW = 5  Fick cardiac output/index =4.5/1.9  Thermo CO/CI = 3.9/1.6  PVR =  FA sat = 98%  PA sat = 62%, 64% - On milrinone 0.25  He returns for follow up. Says he is having a good day. He would like to turn off ICD and consider stopping milrinone because he would like his remaining days to be off Milrinone.  Denies SOB/PND/Orthopnea. Wearing 2 liters oxygen when he ambulates. On chronic Milrinone at 0.375 mcg. Weight at home 245-247 pounds. [facility] following for home milrinone and weekly bmet. Taking all medications but has not had them today.  Following a low salt diet and drinking less than 2L a day.   He is not on an ace inhibitor due to elevated creatinine. Not on nitrates due to Viagra use.   08/15/12 Creatinine 4.24 Potassium 3.3 09/19/12 Creatinine 3.98, Potassium 3.1, BUN 78 10/07/12 Creatinine 2.62, Potassium 3.4 10/18/12 Creatinine 3.3, K+3.1 11/08/12 Creatinine 2.15 K 4.0 11/25/12 Creatinine 2.93, K+ 3.5 03/10/13: K+ 2.9, Creatinine 2.63 04/30/13: K+ 3.7, creatinine 2.4  ROS: All systems negative except as listed in HPI, PMH and Problem List.  Past Medical History   Diagnosis Date  . CHF (congestive heart failure)   . Sarcoidosis   . Cardiomyopathy, dilated, nonischemic     non ischemic by cath  . Acute on chronic systolic heart failure   . Automatic implantable cardiac defibrillator in situ   . Atrial fibrillation   . NSVT (nonsustained ventricular tachycardia)   . GERD (gastroesophageal reflux disease)   . Hypercholesteremia   . Myocardial infarction   . Shortness of breath   . Chronic kidney disease (CKD), stage III (moderate)   . Pacemaker   . Anginal pain   . Gout   . Hypertension     dr [name]  . Coronary artery disease    Current Outpatient Prescriptions  Medication Sig Dispense Refill  . allopurinol (ZYLOPRIM) 100 MG tablet Take 1 tablet (100 mg total) by mouth daily.  30 tablet  6  . ALPRAZolam (XANAX) 0.5 MG tablet Take 0.5 mg by mouth 3 (three) times daily as needed for anxiety.      Marland Kitchen amiodarone (PACERONE) 200 MG tablet Take 200 mg by mouth 2 (two) times daily.      . carvedilol (COREG) 3.125 MG tablet Take 3.125 mg by mouth 2 (two) times daily with a meal.      . eplerenone (INSPRA) 25 MG tablet Take 0.5 tablets (12.5 mg total) by mouth daily.  15 tablet  6  . esomeprazole (NEXIUM) 40 MG capsule Take 1 capsule (40 mg total) by mouth  daily.  30 capsule  3  . fluticasone (FLONASE) 50 MCG/ACT nasal spray Place 2 sprays into both nostrils daily as needed for allergies.       . hydrOXYzine (ATARAX/VISTARIL) 25 MG tablet Take 25 mg by mouth 3 (three) times daily as needed for itching.      Marland Kitchen LORazepam (ATIVAN) 0.5 MG tablet Take 1-2 tablets (0.5-1 mg total) by mouth 2 (two) times daily as needed for anxiety.  50 tablet  0  . metolazone (ZAROXOLYN) 2.5 MG tablet Take 1 tablet (2.5 mg total) by mouth daily as needed (for fluid retention).  10 tablet  1  . milrinone (PRIMACOR) 20 MG/100ML SOLN infusion Inject 37.3125 mcg/min into the vein continuous.  100 mL    . Oxycodone HCl 20 MG TABS Take 1 tablet (20 mg total) by mouth 3 (three)  times daily as needed.  90 tablet  0  . oxyCODONE-acetaminophen (ROXICET) 5-325 MG per tablet Take 2 tablets by mouth every 4 (four) hours as needed for severe pain.  45 tablet  0  . potassium chloride SA (K-DUR,KLOR-CON) 20 MEQ tablet Take 60 mEq by mouth 2 (two) times daily.      . promethazine (PHENERGAN) 25 MG tablet Take 25 mg by mouth every 6 (six) hours as needed for nausea or vomiting.      . ranitidine (ZANTAC) 150 MG capsule Take 150 mg by mouth 2 (two) times daily.      Marland Kitchen senna-docusate (SENOKOT-S) 8.6-50 MG per tablet Take 1-2 tablets by mouth 2 (two) times daily as needed for mild constipation or moderate constipation.      . sertraline (ZOLOFT) 50 MG tablet Take 50 mg by mouth daily.      . sildenafil (VIAGRA) 100 MG tablet Take 1 tablet (100 mg total) by mouth daily as needed for erectile dysfunction.  4 tablet  1  . torsemide (DEMADEX) 20 MG tablet Take 60 mg by mouth daily.      Marland Kitchen zolpidem (AMBIEN) 5 MG tablet Take 1-2 tablets (5-10 mg total) by mouth once.  30 tablet  0  . apixaban (ELIQUIS) 5 MG TABS tablet Take 1 tablet (5 mg total) by mouth 2 (two) times daily.  60 tablet  4   No current facility-administered medications for this encounter.   Filed Vitals:   05/28/13 1535  BP: 114/77  Pulse: 92  Resp: 18  Weight: 254 lb (115.214 kg)  SpO2: 95%    PHYSICAL EXAM: General: NAD; chronically ill appearing. NAD HEENT: normal  Neck: supple. JVP flat. Carotids 2+ bilat; no bruits. No lymphadenopathy or thryomegaly appreciated.  Cor: PMI laterally displaced. Regular S1 S2 with +S3 and 2/6 HSM apex. R upper chest hickman site with Milrinone infusing.  Lungs: clear Abdomen: soft, nontender, obese, nondistended, No bruits or masses. Good bowel sounds.  Extremities: no cyanosis, clubbing, rash, no lower extremity edema  Neuro: alert & orientedx3, cranial nerves grossly intact. moves all 4 extremities w/o difficulty. Affect pleasant   ASSESSMENT & PLAN:   1) Chronic systolic  HF: NICM, EF 27% (03/2012). He has been on home Milrinone 0.375 mcg over 1 year.  Overall he is doing much better today. - Currently NYHA III. Volume status stable. Will continue torsemide 60 mg daily and insturcted to take metolazone only if he gains more than 5 lbs in a day.  - Continue low dose coreg 3.125 mg BID - Continue Inspra 25 mg daily for K+ sparing.  - Continue milrinone 0.375 mcg  through [facility] cath He would like to turn off ICD and start weaning milrinone but he would like to talk to Dr Haroldine Laws. If he elects to stop milrionone and turn off ICD will need to refer back to Hospice.  - No ACEI with CKD -Check ECHO at next visit  -- Reinforced the need and importance of daily weights, a low sodium diet, and fluid restriction (less than 2 L a day). Instructed to call the HF clinic if weight increases more than 3 lbs overnight or 5 lbs in a week.  2) Atrial fibrillation/flutter: Paroxysmal -Regular rate.  No bleeding. Continue eliquis 5 mg  BID. Continue amiodarone 200 mg BID.  3) CKD, stage IV -Cr has been stable. 4) Chronic pain - Per pain clinic.   Follow up 2 weeks with Dr Haroldine Laws  Chelsi Warr D Beckie Viscardi NP-C  3:39 PM

## 2013-05-28 NOTE — Patient Instructions (Signed)
Follow up in 2 weeks with Dr Haroldine Laws   Do the following things EVERYDAY: 1) Weigh yourself in the morning before breakfast. Write it down and keep it in a log. 2) Take your medicines as prescribed 3) Eat low salt foods-Limit salt (sodium) to 2000 mg per day.  4) Stay as active as you can everyday 5) Limit all fluids for the day to less than 2 liters

## 2013-05-29 ENCOUNTER — Encounter (HOSPITAL_COMMUNITY): Payer: PRIVATE HEALTH INSURANCE

## 2013-06-09 ENCOUNTER — Telehealth: Payer: Self-pay | Admitting: *Deleted

## 2013-06-09 ENCOUNTER — Emergency Department (HOSPITAL_COMMUNITY)
Admission: EM | Admit: 2013-06-09 | Discharge: 2013-06-10 | Disposition: A | Discharge status: Home / Self Care | Payer: PRIVATE HEALTH INSURANCE | Attending: Emergency Medicine | Admitting: Emergency Medicine

## 2013-06-09 ENCOUNTER — Encounter (HOSPITAL_COMMUNITY): Payer: Self-pay | Admitting: Emergency Medicine

## 2013-06-09 ENCOUNTER — Emergency Department (HOSPITAL_COMMUNITY): Payer: PRIVATE HEALTH INSURANCE

## 2013-06-09 DIAGNOSIS — I4891 Unspecified atrial fibrillation: Secondary | ICD-10-CM | POA: Insufficient documentation

## 2013-06-09 DIAGNOSIS — Z95810 Presence of automatic (implantable) cardiac defibrillator: Secondary | ICD-10-CM | POA: Insufficient documentation

## 2013-06-09 DIAGNOSIS — I4729 Other ventricular tachycardia: Secondary | ICD-10-CM | POA: Insufficient documentation

## 2013-06-09 DIAGNOSIS — I5022 Chronic systolic (congestive) heart failure: Secondary | ICD-10-CM | POA: Insufficient documentation

## 2013-06-09 DIAGNOSIS — I428 Other cardiomyopathies: Secondary | ICD-10-CM | POA: Insufficient documentation

## 2013-06-09 DIAGNOSIS — I252 Old myocardial infarction: Secondary | ICD-10-CM | POA: Insufficient documentation

## 2013-06-09 DIAGNOSIS — I209 Angina pectoris, unspecified: Secondary | ICD-10-CM | POA: Insufficient documentation

## 2013-06-09 DIAGNOSIS — I472 Ventricular tachycardia, unspecified: Secondary | ICD-10-CM | POA: Insufficient documentation

## 2013-06-09 DIAGNOSIS — R059 Cough, unspecified: Secondary | ICD-10-CM | POA: Insufficient documentation

## 2013-06-09 DIAGNOSIS — Z79899 Other long term (current) drug therapy: Secondary | ICD-10-CM | POA: Insufficient documentation

## 2013-06-09 DIAGNOSIS — I129 Hypertensive chronic kidney disease with stage 1 through stage 4 chronic kidney disease, or unspecified chronic kidney disease: Secondary | ICD-10-CM | POA: Insufficient documentation

## 2013-06-09 DIAGNOSIS — Z8619 Personal history of other infectious and parasitic diseases: Secondary | ICD-10-CM | POA: Insufficient documentation

## 2013-06-09 DIAGNOSIS — K219 Gastro-esophageal reflux disease without esophagitis: Secondary | ICD-10-CM | POA: Insufficient documentation

## 2013-06-09 DIAGNOSIS — N183 Chronic kidney disease, stage 3 unspecified: Secondary | ICD-10-CM | POA: Insufficient documentation

## 2013-06-09 DIAGNOSIS — Z950 Presence of cardiac pacemaker: Secondary | ICD-10-CM | POA: Insufficient documentation

## 2013-06-09 DIAGNOSIS — M7989 Other specified soft tissue disorders: Secondary | ICD-10-CM | POA: Insufficient documentation

## 2013-06-09 DIAGNOSIS — R05 Cough: Secondary | ICD-10-CM | POA: Insufficient documentation

## 2013-06-09 DIAGNOSIS — R000 Tachycardia, unspecified: Secondary | ICD-10-CM | POA: Insufficient documentation

## 2013-06-09 DIAGNOSIS — R072 Precordial pain: Secondary | ICD-10-CM | POA: Insufficient documentation

## 2013-06-09 DIAGNOSIS — Z9889 Other specified postprocedural states: Secondary | ICD-10-CM | POA: Insufficient documentation

## 2013-06-09 DIAGNOSIS — I2510 Atherosclerotic heart disease of native coronary artery without angina pectoris: Secondary | ICD-10-CM | POA: Insufficient documentation

## 2013-06-09 DIAGNOSIS — R42 Dizziness and giddiness: Secondary | ICD-10-CM | POA: Insufficient documentation

## 2013-06-09 DIAGNOSIS — R079 Chest pain, unspecified: Secondary | ICD-10-CM

## 2013-06-09 LAB — BASIC METABOLIC PANEL
BUN: 44 mg/dL — AB (ref 6–23)
CALCIUM: 9.8 mg/dL (ref 8.4–10.5)
CO2: 21 mEq/L (ref 19–32)
Chloride: 92 mEq/L — ABNORMAL LOW (ref 96–112)
Creatinine, Ser: 3.2 mg/dL — ABNORMAL HIGH (ref 0.50–1.35)
GFR calc Af Amer: 22 mL/min — ABNORMAL LOW (ref >90)
GFR calc non Af Amer: 19 mL/min — ABNORMAL LOW (ref >90)
GLUCOSE: 185 mg/dL — AB (ref 70–99)
POTASSIUM: 3.1 meq/L — AB (ref 3.7–5.3)
Sodium: 135 mEq/L — ABNORMAL LOW (ref 137–147)

## 2013-06-09 LAB — CBC WITH DIFFERENTIAL/PLATELET
BASOS PCT: 0 % (ref 0–1)
Basophils Absolute: 0 10*3/uL (ref 0.0–0.1)
Eosinophils Absolute: 0.4 10*3/uL (ref 0.0–0.7)
Eosinophils Relative: 6 % — ABNORMAL HIGH (ref 0–5)
HEMATOCRIT: 43.1 % (ref 39.0–52.0)
HEMOGLOBIN: 15.4 g/dL (ref 13.0–17.0)
Lymphocytes Relative: 37 % (ref 12–46)
Lymphs Abs: 2.6 10*3/uL (ref 0.7–4.0)
MCH: 30.9 pg (ref 26.0–34.0)
MCHC: 35.7 g/dL (ref 30.0–36.0)
MCV: 86.4 fL (ref 78.0–100.0)
MONO ABS: 1 10*3/uL (ref 0.1–1.0)
Monocytes Relative: 14 % — ABNORMAL HIGH (ref 3–12)
NEUTROS ABS: 2.9 10*3/uL (ref 1.7–7.7)
Neutrophils Relative %: 43 % (ref 43–77)
Platelets: 230 10*3/uL (ref 150–400)
RBC: 4.99 MIL/uL (ref 4.22–5.81)
RDW: 12.5 % (ref 11.5–15.5)
WBC: 6.9 10*3/uL (ref 4.0–10.5)

## 2013-06-09 LAB — I-STAT CHEM 8, ED
BUN: 46 mg/dL — ABNORMAL HIGH (ref 6–23)
Calcium, Ion: 1.17 mmol/L (ref 1.13–1.30)
Chloride: 95 mEq/L — ABNORMAL LOW (ref 96–112)
Creatinine, Ser: 3.6 mg/dL — ABNORMAL HIGH (ref 0.50–1.35)
GLUCOSE: 188 mg/dL — AB (ref 70–99)
HEMATOCRIT: 48 % (ref 39.0–52.0)
HEMOGLOBIN: 16.3 g/dL (ref 13.0–17.0)
Potassium: 3 mEq/L — ABNORMAL LOW (ref 3.7–5.3)
Sodium: 139 mEq/L (ref 137–147)
TCO2: 28 mmol/L (ref 0–100)

## 2013-06-09 LAB — LACTIC ACID, PLASMA: Lactic Acid, Venous: 2.7 mmol/L — ABNORMAL HIGH (ref 0.5–2.2)

## 2013-06-09 LAB — CBG MONITORING, ED: Glucose-Capillary: 181 mg/dL — ABNORMAL HIGH (ref 70–99)

## 2013-06-09 LAB — I-STAT TROPONIN, ED: Troponin i, poc: 0.04 ng/mL (ref 0.00–0.08)

## 2013-06-09 LAB — PROTIME-INR
INR: 1.09 (ref 0.00–1.49)
Prothrombin Time: 13.9 seconds (ref 11.6–15.2)

## 2013-06-09 LAB — PRO B NATRIURETIC PEPTIDE: Pro B Natriuretic peptide (BNP): 564 pg/mL — ABNORMAL HIGH (ref 0–125)

## 2013-06-09 NOTE — Telephone Encounter (Signed)
I see that he had 90 tabs filled on 6/2. Cannot refill.

## 2013-06-09 NOTE — ED Notes (Signed)
Per EMS: pt coming from home with c/o chest pain, and abdominal distention. Pt has continuous milrinone running. Pt reports substernal chest pain, radiating down right arm and right neck since yesterday. Pt also c/o increased abdominal distention. Pt A&Ox4, respirations equal and unlabored, skin warm and dry

## 2013-06-09 NOTE — ED Provider Notes (Signed)
CSN: [id number]     Arrival date & time 06/09/13  2152 History   First MD Initiated Contact with Patient 06/09/13 2156     Chief Complaint  Patient presents with  . Chest Pain  . Shortness of Breath     (Consider location/radiation/quality/duration/timing/severity/associated sxs/prior Treatment) Patient is a 63 y.o. male presenting with chest pain. The history is provided by the patient, the EMS personnel and medical records.  Chest Pain Pain location:  Substernal area and R chest Pain quality: tightness   Pain radiates to:  Neck, R arm and R shoulder Pain radiates to the back: no   Pain severity:  Severe Onset quality:  Gradual Timing:  Constant Progression:  Worsening Chronicity:  Recurrent Relieved by:  Nothing Worsened by:  Nothing tried Associated symptoms: cough, palpitations and shortness of breath   Associated symptoms: no abdominal pain, no dizziness, no fever, no headache and not vomiting     63 yo male with chest tightness. Onset yesterday. Central/right sided. Radiates to right neck and arm. H/o same. Has chf and h/o arrhythmias. On milrinone ggt constantly Worsening abdominal girth and ble edema. No leg pain.  Mild sob.  Chronic orthopnea unchanged.  Cough. No fevers.  EMS called. Tachycardic to 120's with them.   Past Medical History  Diagnosis Date  . CHF (congestive heart failure)   . Sarcoidosis   . Cardiomyopathy, dilated, nonischemic     non ischemic by cath  . Acute on chronic systolic heart failure   . Automatic implantable cardiac defibrillator in situ   . Atrial fibrillation   . NSVT (nonsustained ventricular tachycardia)   . GERD (gastroesophageal reflux disease)   . Hypercholesteremia   . Myocardial infarction   . Shortness of breath   . Chronic kidney disease (CKD), stage III (moderate)   . Pacemaker   . Anginal pain   . Gout   . Hypertension     dr [name]  . Coronary artery disease    Past Surgical History  Procedure Laterality  Date  . Back surgery  1987    Ruptured disk repair  . Pacemaker insertion      with ICD  . Tee without cardioversion  01/17/2011    Procedure: TRANSESOPHAGEAL ECHOCARDIOGRAM (TEE);  Surgeon: Birdie Riddle, MD;  Location: [place];  Service: Cardiovascular;  Laterality: N/A;  . Cardioversion  01/17/2011    Procedure: CARDIOVERSION;  Surgeon: Birdie Riddle, MD;  Location: [place];  Service: Cardiovascular;  Laterality: N/A;  . Cardiac catheterization    . Insert / replace / remove pacemaker    . Anterior cervical decomp/discectomy fusion  08/21/2011    Procedure: ANTERIOR CERVICAL DECOMPRESSION/DISCECTOMY FUSION 2 LEVELS;  Surgeon: Marybelle Killings, MD;  Location: [place];  Service: Orthopedics;  Laterality: N/A;  C5-6, C6-7 Anterior Cervical Discectomy and Fusion, allograft, plate  . Tee without cardioversion N/A 02/16/2012    Procedure: TRANSESOPHAGEAL ECHOCARDIOGRAM (TEE);  Surgeon: Jolaine Artist, MD;  Location: [facility] ENDOSCOPY;  Service: Cardiovascular;  Laterality: N/A;  original case scheduled under his dad (who is deceased), rescheduled under correct mrn/pt/dob. [place]/dl  . Tee without cardioversion N/A 03/22/2012    Procedure: TRANSESOPHAGEAL ECHOCARDIOGRAM (TEE);  Surgeon: Lelon Perla, MD;  Location: [facility] ENDOSCOPY;  Service: Cardiovascular;  Laterality: N/A;  . Cardioversion N/A 03/22/2012    Procedure: CARDIOVERSION;  Surgeon: Lelon Perla, MD;  Location: [place];  Service: Cardiovascular;  Laterality: N/A;  . Cardioversion N/A 04/26/2012    Procedure: CARDIOVERSION;  Surgeon: Jolaine Artist, MD;  Location: [facility] ENDOSCOPY;  Service: Cardiovascular;  Laterality: N/A;  . Central venous catheter tunneled insertion single lumen  09/16/2012    right IJ  . Cardioversion N/A 11/08/2012    Procedure: CARDIOVERSION;  Surgeon: Jolaine Artist, MD;  Location: [facility] ENDOSCOPY;  Service: Cardiovascular;  Laterality: N/A;   Family History  Problem Relation Age of Onset  . Heart  disease    . Heart failure    . Stroke    . Anesthesia problems Neg Hx   . Hypotension Neg Hx   . Malignant hyperthermia Neg Hx   . Pseudochol deficiency Neg Hx    History  Substance Use Topics  . Smoking status: Never Smoker   . Smokeless tobacco: Never Used  . Alcohol Use: No    Review of Systems  Constitutional: Negative for fever and chills.  HENT: Negative for congestion and rhinorrhea.   Respiratory: Positive for cough and shortness of breath.   Cardiovascular: Positive for chest pain, palpitations and leg swelling.  Gastrointestinal: Negative for vomiting, abdominal pain and diarrhea.  Genitourinary: Negative for flank pain and difficulty urinating.  Skin: Negative for color change and rash.  Neurological: Positive for light-headedness. Negative for dizziness and headaches.  All other systems reviewed and are negative.     Allergies  Nitroglycerin  Home Medications   Prior to Admission medications   Medication Sig Start Date End Date Taking? Authorizing Provider  allopurinol (ZYLOPRIM) 100 MG tablet Take 1 tablet (100 mg total) by mouth daily. 02/05/12  Yes Wynetta Emery, PA-C  ALPRAZolam Duanne Moron) 0.5 MG tablet Take 0.5 mg by mouth 3 (three) times daily as needed for anxiety.   Yes Historical Provider, MD  amiodarone (PACERONE) 200 MG tablet Take 200 mg by mouth 2 (two) times daily.   Yes Historical Provider, MD  apixaban (ELIQUIS) 5 MG TABS tablet Take 1 tablet (5 mg total) by mouth 2 (two) times daily. 10/09/12  Yes Rande Brunt, NP  carvedilol (COREG) 3.125 MG tablet Take 3.125 mg by mouth 2 (two) times daily with a meal.   Yes Historical Provider, MD  eplerenone (INSPRA) 25 MG tablet Take 0.5 tablets (12.5 mg total) by mouth daily. 01/23/13  Yes Amy D Clegg, NP  esomeprazole (NEXIUM) 40 MG capsule Take 1 capsule (40 mg total) by mouth daily. 01/10/13  Yes Shaune Pascal Bensimhon, MD  fluticasone (FLONASE) 50 MCG/ACT nasal spray Place 2 sprays into both nostrils daily as  needed for allergies.    Yes Historical Provider, MD  hydrOXYzine (ATARAX/VISTARIL) 25 MG tablet Take 25 mg by mouth 3 (three) times daily as needed for itching.   Yes Historical Provider, MD  LORazepam (ATIVAN) 0.5 MG tablet Take 1-2 tablets (0.5-1 mg total) by mouth 2 (two) times daily as needed for anxiety. 05/19/13  Yes Debbrah Alar, NP  metolazone (ZAROXOLYN) 2.5 MG tablet Take 1 tablet (2.5 mg total) by mouth daily as needed (for fluid retention). 04/21/13  Yes Shaune Pascal Bensimhon, MD  milrinone [facility]) 20 MG/100ML SOLN infusion Inject 0.375 mcg/kg/min into the vein continuous. 440mg  in 457ml infusion 2.5 ml per hour   Yes Historical Provider, MD  Oxycodone HCl 20 MG TABS Take 1 tablet (20 mg total) by mouth 3 (three) times daily as needed. 05/19/13  Yes Debbrah Alar, NP  potassium chloride SA (K-DUR,KLOR-CON) 20 MEQ tablet Take 60 mEq by mouth 2 (two) times daily.   Yes Historical Provider, MD  promethazine (PHENERGAN) 25 MG tablet  Take 25 mg by mouth every 6 (six) hours as needed for nausea or vomiting.   Yes Historical Provider, MD  ranitidine (ZANTAC) 150 MG capsule Take 150 mg by mouth 2 (two) times daily.   Yes Historical Provider, MD  senna-docusate (SENOKOT-S) 8.6-50 MG per tablet Take 1-2 tablets by mouth 2 (two) times daily as needed for mild constipation or moderate constipation.   Yes Historical Provider, MD  sertraline (ZOLOFT) 50 MG tablet Take 50 mg by mouth daily.   Yes Historical Provider, MD  sildenafil (VIAGRA) 100 MG tablet Take 1 tablet (100 mg total) by mouth daily as needed for erectile dysfunction. 06/10/12  Yes Amy D Clegg, NP  torsemide (DEMADEX) 20 MG tablet Take 60 mg by mouth daily.   Yes Historical Provider, MD  zolpidem (AMBIEN) 5 MG tablet Take 5 mg by mouth at bedtime as needed for sleep.   Yes Historical Provider, MD   BP 117/83  Pulse 145  Temp(Src) 98.1 F (36.7 C) (Oral)  Resp 24  SpO2 93% Physical Exam  Nursing note and vitals  reviewed. Constitutional: He is oriented to person, place, and time. He appears well-developed and well-nourished. No distress.  HENT:  Head: Normocephalic and atraumatic.  Eyes: Conjunctivae are normal. Right eye exhibits no discharge. Left eye exhibits no discharge.  Neck: No tracheal deviation present.  Cardiovascular: Regular rhythm, normal heart sounds and intact distal pulses.   Tachycardic to 139.  Pulmonary/Chest: Effort normal. No stridor. No respiratory distress. He has no wheezes. He has rales (mild bibasilar).  Abdominal: Soft. He exhibits no distension. There is no tenderness. There is no guarding.  Musculoskeletal: He exhibits edema. He exhibits no tenderness.  Neurological: He is alert and oriented to person, place, and time.  Skin: Skin is warm and dry.    ED Course  Procedures (including critical care time) Labs Review Labs Reviewed  CBC WITH DIFFERENTIAL - Abnormal; Notable for the following:    Monocytes Relative 14 (*)    Eosinophils Relative 6 (*)    All other components within normal limits  BASIC METABOLIC PANEL - Abnormal; Notable for the following:    Sodium 135 (*)    Potassium 3.1 (*)    Chloride 92 (*)    Glucose, Bld 185 (*)    BUN 44 (*)    Creatinine, Ser 3.20 (*)    GFR calc non Af Amer 19 (*)    GFR calc Af Amer 22 (*)    All other components within normal limits  LACTIC ACID, PLASMA - Abnormal; Notable for the following:    Lactic Acid, Venous 2.7 (*)    All other components within normal limits  PRO B NATRIURETIC PEPTIDE - Abnormal; Notable for the following:    Pro B Natriuretic peptide (BNP) 564.0 (*)    All other components within normal limits  CBG MONITORING, ED - Abnormal; Notable for the following:    Glucose-Capillary 181 (*)    All other components within normal limits  I-STAT CHEM 8, ED - Abnormal; Notable for the following:    Potassium 3.0 (*)    Chloride 95 (*)    BUN 46 (*)    Creatinine, Ser 3.60 (*)    Glucose, Bld 188  (*)    All other components within normal limits  PROTIME-INR  Randolm Idol, ED    Imaging Review Dg Chest Port 1 View  06/09/2013   CLINICAL DATA:  Right-sided chest pain and shortness of breath.  EXAM: PORTABLE  CHEST - 1 VIEW  COMPARISON:  11/25/2012  FINDINGS: Unchanged appearance of cardiac pacemaker, right central venous catheter, and postoperative changes in the cervical spine. Borderline heart size with normal pulmonary vascularity. Lungs appear grossly clear and expanded. No significant change since prior study.  IMPRESSION: No active disease.   Electronically Signed   By: Lucienne Capers M.D.   On: 06/09/2013 23:04     EKG Interpretation   Date/Time:  Monday June 09 2013 22:15:19 EDT Ventricular Rate:  143 PR Interval:  142 QRS Duration: 143 QT Interval:  353 QTC Calculation: 544 R Axis:   -80 Text Interpretation:  Ectopic atrial tachycardia, unifocal Nonspecific  IVCD with LAD Inferior infarct, acute (LCx) Lateral leads are also  involved Baseline wander in lead(s) V4 V5 V6 Confirmed by Alvino Chapel  MD,  NATHAN [phone]) on 06/09/2013 11:22:15 PM      MDM   Final diagnoses:  None    CP with SOB. H/o same. Tachycardic. EKG with likely atrial tachycardia. Spontaneous conversion to sinus tachycardia at about 100-110. Discussed with cards (Dr. Aundra Dubin). Patient h/o NICM. Troponin negative and without ischemic changes. Plan for delta troponin. Amio IV 150mg  (patient briefly went back into atrial tachycardia just prior to administration, but subsequently back to sinus tachycardia). Plan to keep amio dosing the same at 600mg  BID PO. Has scheduled follow up with cardiology <48 hours.  Bump in Cr. Has had similar previously. No decrease uop or po intake.  W/u without infectious findings.  Remains at baseline BP with O2 sats mid to high 90's on baseline O2.   Discussed with patient who is currently working with cardiology about deescalating his care. Was previously on hospice and  looking to go back on hospice again.   Awaiting delta troponin on to monitor after amio 150mg  IV.       Bonnita Hollow, MD 06/10/13 [phone]

## 2013-06-09 NOTE — ED Notes (Signed)
MD at bedside. 

## 2013-06-09 NOTE — ED Notes (Signed)
CBG result was 181. Nurse Mortimer Fries) informed of elevated glucose.

## 2013-06-09 NOTE — Telephone Encounter (Signed)
Pt called requesting refill of alprazolam be called to [facility] on [address]. I do not see that we have filled rx for pt before.  Please advise.

## 2013-06-10 ENCOUNTER — Telehealth (HOSPITAL_COMMUNITY): Payer: Self-pay

## 2013-06-10 LAB — I-STAT TROPONIN, ED: Troponin i, poc: 0.03 ng/mL (ref 0.00–0.08)

## 2013-06-10 MED ORDER — POTASSIUM CHLORIDE CRYS ER 20 MEQ PO TBCR
40.0000 meq | EXTENDED_RELEASE_TABLET | Freq: Once | ORAL | Status: AC
  Administered 2013-06-10: 40 meq via ORAL
  Filled 2013-06-10: qty 2

## 2013-06-10 MED ORDER — MORPHINE SULFATE 4 MG/ML IJ SOLN
4.0000 mg | Freq: Once | INTRAMUSCULAR | Status: AC
  Administered 2013-06-10: 4 mg via INTRAVENOUS
  Filled 2013-06-10: qty 1

## 2013-06-10 MED ORDER — AMIODARONE IV BOLUS ONLY 150 MG/100ML
150.0000 mg | Freq: Once | INTRAVENOUS | Status: AC
  Administered 2013-06-10: 150 mg via INTRAVENOUS
  Filled 2013-06-10: qty 100

## 2013-06-10 NOTE — Discharge Instructions (Signed)
PLEASE CONTINUE YOUR AMIODARONE DOSING AS IT CURRENTLY IS - 600MG  TWICE DAILY.  FOLLOW UP WITH CARDIOLOGY AS DISCUSSED.      Cardiac Arrhythmia Your heart is a muscle that works to pump blood through your body by regular contractions. The beating of your heart is controlled by a system of special pacemaker cells. These cells control the electrical activity of the heart. When the system controlling this regular beating is disturbed, a heart rhythm abnormality (arrhythmia) results. WHEN YOUR HEART SKIPS A BEAT One of the most common and least serious heart arrhythmias is called an ectopic or premature atrial heartbeat (PAC). This may be noticed as a small change in your regular pulse. A PAC originates from the top part (atrium) of the heart. Within the right atrium, the SA node is the area that normally controls the regularity of the heart. PACs occur in heart tissue outside of the SA node region. You may feel this as a skipped beat or heart flutter, especially if several occur in succession or occur frequently.  Another arrhythmia is ventricular premature complex (VCP or PVC). These extra beats start out in the bottom, more muscular chambers of the heart. In most cases a PVC is harmless. If there are underlying causes that are making the heart irritable such as an overactive thyroid or a prior heart attack PVCs may be of more concern. In a few cases, medications to control the heart rhythm may be prescribed. Things to try at home:  Cut down or avoid alcohol, tobacco and caffeine.  Get enough sleep.  Reduce stress.  Exercise more. WHEN THE HEART BEATS TOO FAST Atrial tachycardia is a fast heart rate, which starts out in the atrium. It may last from minutes to much longer. Your heart may beat 140 to 240 times per minute instead of the normal 60 to 100.  Symptoms include a worried feeling (anxiety) and a sense that your heart is beating fast and hard.  You may be able to stop the fast rate by  holding your breath or bearing down as if you were going to have a bowel movement.  This type of fast rate is usually not dangerous. Atrial fibrillation and atrial flutter are other fast rhythms that start in the atria. Both conditions keep the atria from filling with enough blood so the heart does not work well.  Symptoms include feeling light-headed or faint.  These fast rates may be the result of heart damage or disease. Too much thyroid hormone may play a role.  There may be no clear cause or it may be from heart disease or damage.  Medication or a special electrical treatment (cardioversion) may be needed to get the heart beating normally. Ventricular tachycardia is a fast heart rate that starts in the lower muscular chambers (ventricles) This is a serious disorder that requires treatment as soon as possible. You need someone else to get and use a small defibrillator.  Symptoms include collapse, chest pain, or being short of breath.  Treatment may include medication, procedures to improve blood flow to the heart, or an implantable cardiac defibrillator (ICD). DIAGNOSIS   A cardiogram (EKG or ECG) will be done to see the arrhythmia, as well as lab tests to check the underlying cause.  If the extra beats or fast rate come and go, you may wear a Holter monitor that records your heart rate for a longer period of time. SEEK MEDICAL CARE IF:  You have irregular or fast heartbeats (palpitations).  You  experience skipped beats.  You develop lightheadedness.  You have chest discomfort.  You have shortness of breath.  You have more frequent episodes, if you are already being treated. SEEK IMMEDIATE MEDICAL CARE IF:   You have severe chest pain, especially if the pain is crushing or pressure-like and spreads to the arms, back, neck, or jaw, or if you have sweating, feeling sick to your stomach (nausea), or shortness of breath. THIS IS AN EMERGENCY. Do not wait to see if the pain will  go away. Get medical help at once. Call 911 or 0 (operator). DO NOT drive yourself to the hospital.  You feel dizzy or faint.  You have episodes of previously documented atrial tachycardia that do not resolve with the techniques your caregiver has taught you.  Irregular or rapid heartbeats begin to occur more often than in the past, especially if they are associated with more pronounced symptoms or of longer duration. Document Released: 12/19/2004 Document Revised: 03/13/2011 Document Reviewed: 08/07/2007 [facility] Patient Information 2014 [place].  Chest Pain (Nonspecific) It is often hard to give a specific diagnosis for the cause of chest pain. There is always a chance that your pain could be related to something serious, such as a heart attack or a blood clot in the lungs. You need to follow up with your caregiver for further evaluation. CAUSES   Heartburn.  Pneumonia or bronchitis.  Anxiety or stress.  Inflammation around your heart (pericarditis) or lung (pleuritis or pleurisy).  A blood clot in the lung.  A collapsed lung (pneumothorax). It can develop suddenly on its own (spontaneous pneumothorax) or from injury (trauma) to the chest.  Shingles infection (herpes zoster virus). The chest wall is composed of bones, muscles, and cartilage. Any of these can be the source of the pain.  The bones can be bruised by injury.  The muscles or cartilage can be strained by coughing or overwork.  The cartilage can be affected by inflammation and become sore (costochondritis). DIAGNOSIS  Lab tests or other studies, such as X-rays, electrocardiography, stress testing, or cardiac imaging, may be needed to find the cause of your pain.  TREATMENT   Treatment depends on what may be causing your chest pain. Treatment may include:  Acid blockers for heartburn.  Anti-inflammatory medicine.  Pain medicine for inflammatory conditions.  Antibiotics if an infection is  present.  You may be advised to change lifestyle habits. This includes stopping smoking and avoiding alcohol, caffeine, and chocolate.  You may be advised to keep your head raised (elevated) when sleeping. This reduces the chance of acid going backward from your stomach into your esophagus.  Most of the time, nonspecific chest pain will improve within 2 to 3 days with rest and mild pain medicine. HOME CARE INSTRUCTIONS   If antibiotics were prescribed, take your antibiotics as directed. Finish them even if you start to feel better.  For the next few days, avoid physical activities that bring on chest pain. Continue physical activities as directed.  Do not smoke.  Avoid drinking alcohol.  Only take over-the-counter or prescription medicine for pain, discomfort, or fever as directed by your caregiver.  Follow your caregiver's suggestions for further testing if your chest pain does not go away.  Keep any follow-up appointments you made. If you do not go to an appointment, you could develop lasting (chronic) problems with pain. If there is any problem keeping an appointment, you must call to reschedule. SEEK MEDICAL CARE IF:   You think  you are having problems from the medicine you are taking. Read your medicine instructions carefully.  Your chest pain does not go away, even after treatment.  You develop a rash with blisters on your chest. SEEK IMMEDIATE MEDICAL CARE IF:   You have increased chest pain or pain that spreads to your arm, neck, jaw, back, or abdomen.  You develop shortness of breath, an increasing cough, or you are coughing up blood.  You have severe back or abdominal pain, feel nauseous, or vomit.  You develop severe weakness, fainting, or chills.  You have a fever. THIS IS AN EMERGENCY. Do not wait to see if the pain will go away. Get medical help at once. Call your local emergency services (911 in [place] Do not drive yourself to the hospital. MAKE SURE YOU:    Understand these instructions.  Will watch your condition.  Will get help right away if you are not doing well or get worse. Document Released: 09/28/2004 Document Revised: 03/13/2011 Document Reviewed: 07/25/2007 [facility] Patient Information 2014 [place].

## 2013-06-10 NOTE — Telephone Encounter (Signed)
Lab results reviewed with patient.  K low from ED visit on 06/09/13.  Patient confirms he takes 78meq (3tabs) of potassium twice daily.  Instructed to take extra 40 meq (2 tabs) today at lunch.  Will recheck bmet tomorrow at his appointment with our clinic.  Aware and agreeable. [name] Pain

## 2013-06-10 NOTE — ED Provider Notes (Signed)
I saw and evaluated the patient, reviewed the resident's note and I agree with the findings and plan.   EKG Interpretation   Date/Time:  Tuesday June 10 2013 00:08:09 EDT Ventricular Rate:  109 PR Interval:  142 QRS Duration: 118 QT Interval:  444 QTC Calculation: 598 R Axis:   -80 Text Interpretation:  Atrial fibrillation Multiple ventricular premature  complexes Left anterior fascicular block Low voltage, precordial leads ST  elevation, consider inferior injury Confirmed by Alvino Chapel  MD, Shakil Dirk  [phone]) on 06/10/2013 12:15:10 AM     Patient with tachycardia. Has history of atrial tachycardias and atrial fibrillation. He has nonischemic cardiopathy and has been discussion of changing to palliative care. Had previous been on hospice. Discussed with the patient and he states he is thinking of going more palliative care as he knows he is not any better. He states his family may not be ready this but he has made the decision. Discussed cardiology. Blood pressure is improved heart rate is coming down. Will be loaded on amiodarone and a second troponin.  [name]. Alvino Chapel, MD 06/10/13 1505

## 2013-06-10 NOTE — Telephone Encounter (Signed)
Notified pt. He states he did not pick up this Rx on 06/03/13. Advised him to check with his pharmacy. Pt wanted to know if someone else could have picked up his medication. I told him he would have to check with his pharmacy on their policy. Pt voices understanding.

## 2013-06-10 NOTE — ED Provider Notes (Signed)
3:45 AM patient resting comfortably. Remains in sinus tachycardia question 105 beats per minute. I spoke with Dr. Algernon Huxley. Patient is at his baseline pulse and blood pressure. He has ejection fraction of approximately 15%. Plan he has appointment with heart failure clinic this week. Results for orders placed during the hospital encounter of 06/09/13  CBC WITH DIFFERENTIAL      Result Value Ref Range   WBC 6.9  4.0 - 10.5 K/uL   RBC 4.99  4.22 - 5.81 MIL/uL   Hemoglobin 15.4  13.0 - 17.0 g/dL   HCT 43.1  39.0 - 52.0 %   MCV 86.4  78.0 - 100.0 fL   MCH 30.9  26.0 - 34.0 pg   MCHC 35.7  30.0 - 36.0 g/dL   RDW 12.5  11.5 - 15.5 %   Platelets 230  150 - 400 K/uL   Neutrophils Relative % 43  43 - 77 %   Lymphocytes Relative 37  12 - 46 %   Monocytes Relative 14 (*) 3 - 12 %   Eosinophils Relative 6 (*) 0 - 5 %   Basophils Relative 0  0 - 1 %   Neutro Abs 2.9  1.7 - 7.7 K/uL   Lymphs Abs 2.6  0.7 - 4.0 K/uL   Monocytes Absolute 1.0  0.1 - 1.0 K/uL   Eosinophils Absolute 0.4  0.0 - 0.7 K/uL   Basophils Absolute 0.0  0.0 - 0.1 K/uL   Smear Review MORPHOLOGY UNREMARKABLE    BASIC METABOLIC PANEL      Result Value Ref Range   Sodium 135 (*) 137 - 147 mEq/L   Potassium 3.1 (*) 3.7 - 5.3 mEq/L   Chloride 92 (*) 96 - 112 mEq/L   CO2 21  19 - 32 mEq/L   Glucose, Bld 185 (*) 70 - 99 mg/dL   BUN 44 (*) 6 - 23 mg/dL   Creatinine, Ser 3.20 (*) 0.50 - 1.35 mg/dL   Calcium 9.8  8.4 - 10.5 mg/dL   GFR calc non Af Amer 19 (*) >90 mL/min   GFR calc Af Amer 22 (*) >90 mL/min  LACTIC ACID, PLASMA      Result Value Ref Range   Lactic Acid, Venous 2.7 (*) 0.5 - 2.2 mmol/L  PRO B NATRIURETIC PEPTIDE      Result Value Ref Range   Pro B Natriuretic peptide (BNP) 564.0 (*) 0 - 125 pg/mL  PROTIME-INR      Result Value Ref Range   Prothrombin Time 13.9  11.6 - 15.2 seconds   INR 1.09  0.00 - 1.49  CBG MONITORING, ED      Result Value Ref Range   Glucose-Capillary 181 (*) 70 - 99 mg/dL  I-STAT CHEM 8,  ED      Result Value Ref Range   Sodium 139  137 - 147 mEq/L   Potassium 3.0 (*) 3.7 - 5.3 mEq/L   Chloride 95 (*) 96 - 112 mEq/L   BUN 46 (*) 6 - 23 mg/dL   Creatinine, Ser 3.60 (*) 0.50 - 1.35 mg/dL   Glucose, Bld 188 (*) 70 - 99 mg/dL   Calcium, Ion 1.17  1.13 - 1.30 mmol/L   TCO2 28  0 - 100 mmol/L   Hemoglobin 16.3  13.0 - 17.0 g/dL   HCT 48.0  39.0 - 52.0 %  I-STAT TROPOININ, ED      Result Value Ref Range   Troponin i, poc 0.04  0.00 - 0.08 ng/mL  Comment 3           I-STAT TROPOININ, ED      Result Value Ref Range   Troponin i, poc 0.03  0.00 - 0.08 ng/mL   Comment 3            Dg Lumbar Spine Complete  05/28/2013   CLINICAL DATA:  Lower back pain.  EXAM: LUMBAR SPINE - COMPLETE 4+ VIEW  COMPARISON:  None.  FINDINGS: No fracture is noted. Moderate degenerative disc disease of L4-5 is noted with minimal grade 1 retrolisthesis. Moderate degenerative disc disease is noted at L5-S1 is well. Mild spurring is seen at L1-2 and L2-3 as well.  IMPRESSION: Moderate degenerative disc disease is noted at L4-5 and L5-S1. No acute abnormality seen in the lumbar spine.   Electronically Signed   By: Sabino Dick M.D.   On: 05/28/2013 16:49   Dg Chest Port 1 View  06/09/2013   CLINICAL DATA:  Right-sided chest pain and shortness of breath.  EXAM: PORTABLE CHEST - 1 VIEW  COMPARISON:  11/25/2012  FINDINGS: Unchanged appearance of cardiac pacemaker, right central venous catheter, and postoperative changes in the cervical spine. Borderline heart size with normal pulmonary vascularity. Lungs appear grossly clear and expanded. No significant change since prior study.  IMPRESSION: No active disease.   Electronically Signed   By: Lucienne Capers M.D.   On: 06/09/2013 23:04     Orlie Dakin, MD 06/10/13 [phone]

## 2013-06-11 ENCOUNTER — Ambulatory Visit (HOSPITAL_COMMUNITY)
Admission: RE | Admit: 2013-06-11 | Discharge: 2013-06-11 | Disposition: A | Discharge status: Home / Self Care | Payer: PRIVATE HEALTH INSURANCE | Source: Ambulatory Visit | Attending: Family | Admitting: Family

## 2013-06-11 ENCOUNTER — Encounter (HOSPITAL_COMMUNITY): Payer: Self-pay

## 2013-06-11 ENCOUNTER — Ambulatory Visit (HOSPITAL_BASED_OUTPATIENT_CLINIC_OR_DEPARTMENT_OTHER)
Admission: RE | Admit: 2013-06-11 | Discharge: 2013-06-11 | Disposition: A | Discharge status: Home / Self Care | Payer: PRIVATE HEALTH INSURANCE | Source: Ambulatory Visit | Attending: Family | Admitting: Family

## 2013-06-11 ENCOUNTER — Other Ambulatory Visit: Payer: Self-pay | Admitting: *Deleted

## 2013-06-11 ENCOUNTER — Encounter (HOSPITAL_COMMUNITY): Payer: Self-pay | Admitting: *Deleted

## 2013-06-11 VITALS — BP 102/80 | HR 105 | Wt 252.4 lb

## 2013-06-11 DIAGNOSIS — I509 Heart failure, unspecified: Secondary | ICD-10-CM

## 2013-06-11 DIAGNOSIS — N184 Chronic kidney disease, stage 4 (severe): Secondary | ICD-10-CM | POA: Insufficient documentation

## 2013-06-11 DIAGNOSIS — I5022 Chronic systolic (congestive) heart failure: Secondary | ICD-10-CM

## 2013-06-11 DIAGNOSIS — I252 Old myocardial infarction: Secondary | ICD-10-CM | POA: Insufficient documentation

## 2013-06-11 DIAGNOSIS — I129 Hypertensive chronic kidney disease with stage 1 through stage 4 chronic kidney disease, or unspecified chronic kidney disease: Secondary | ICD-10-CM | POA: Insufficient documentation

## 2013-06-11 DIAGNOSIS — I2510 Atherosclerotic heart disease of native coronary artery without angina pectoris: Secondary | ICD-10-CM | POA: Insufficient documentation

## 2013-06-11 DIAGNOSIS — I379 Nonrheumatic pulmonary valve disorder, unspecified: Secondary | ICD-10-CM

## 2013-06-11 DIAGNOSIS — N182 Chronic kidney disease, stage 2 (mild): Secondary | ICD-10-CM

## 2013-06-11 DIAGNOSIS — I4891 Unspecified atrial fibrillation: Secondary | ICD-10-CM | POA: Insufficient documentation

## 2013-06-11 LAB — BASIC METABOLIC PANEL
BUN: 51 mg/dL — ABNORMAL HIGH (ref 6–23)
CO2: 18 meq/L — AB (ref 19–32)
CREATININE: 3.11 mg/dL — AB (ref 0.50–1.35)
Calcium: 10.3 mg/dL (ref 8.4–10.5)
Chloride: 94 mEq/L — ABNORMAL LOW (ref 96–112)
GFR calc Af Amer: 23 mL/min — ABNORMAL LOW (ref >90)
GFR calc non Af Amer: 20 mL/min — ABNORMAL LOW (ref >90)
Glucose, Bld: 149 mg/dL — ABNORMAL HIGH (ref 70–99)
Potassium: 3.9 mEq/L (ref 3.7–5.3)
SODIUM: 136 meq/L — AB (ref 137–147)

## 2013-06-11 LAB — PRO B NATRIURETIC PEPTIDE: Pro B Natriuretic peptide (BNP): 542.4 pg/mL — ABNORMAL HIGH (ref 0–125)

## 2013-06-11 IMAGING — CR DG CHEST 2V
2 series · 2 of 2 positions shown · non-contrast
Comparison: Chest radiograph performed 12/09/2011

CLINICAL DATA: Shortness of breath and left-sided chest pain.

CHEST - 2 VIEW

[w chest pa]
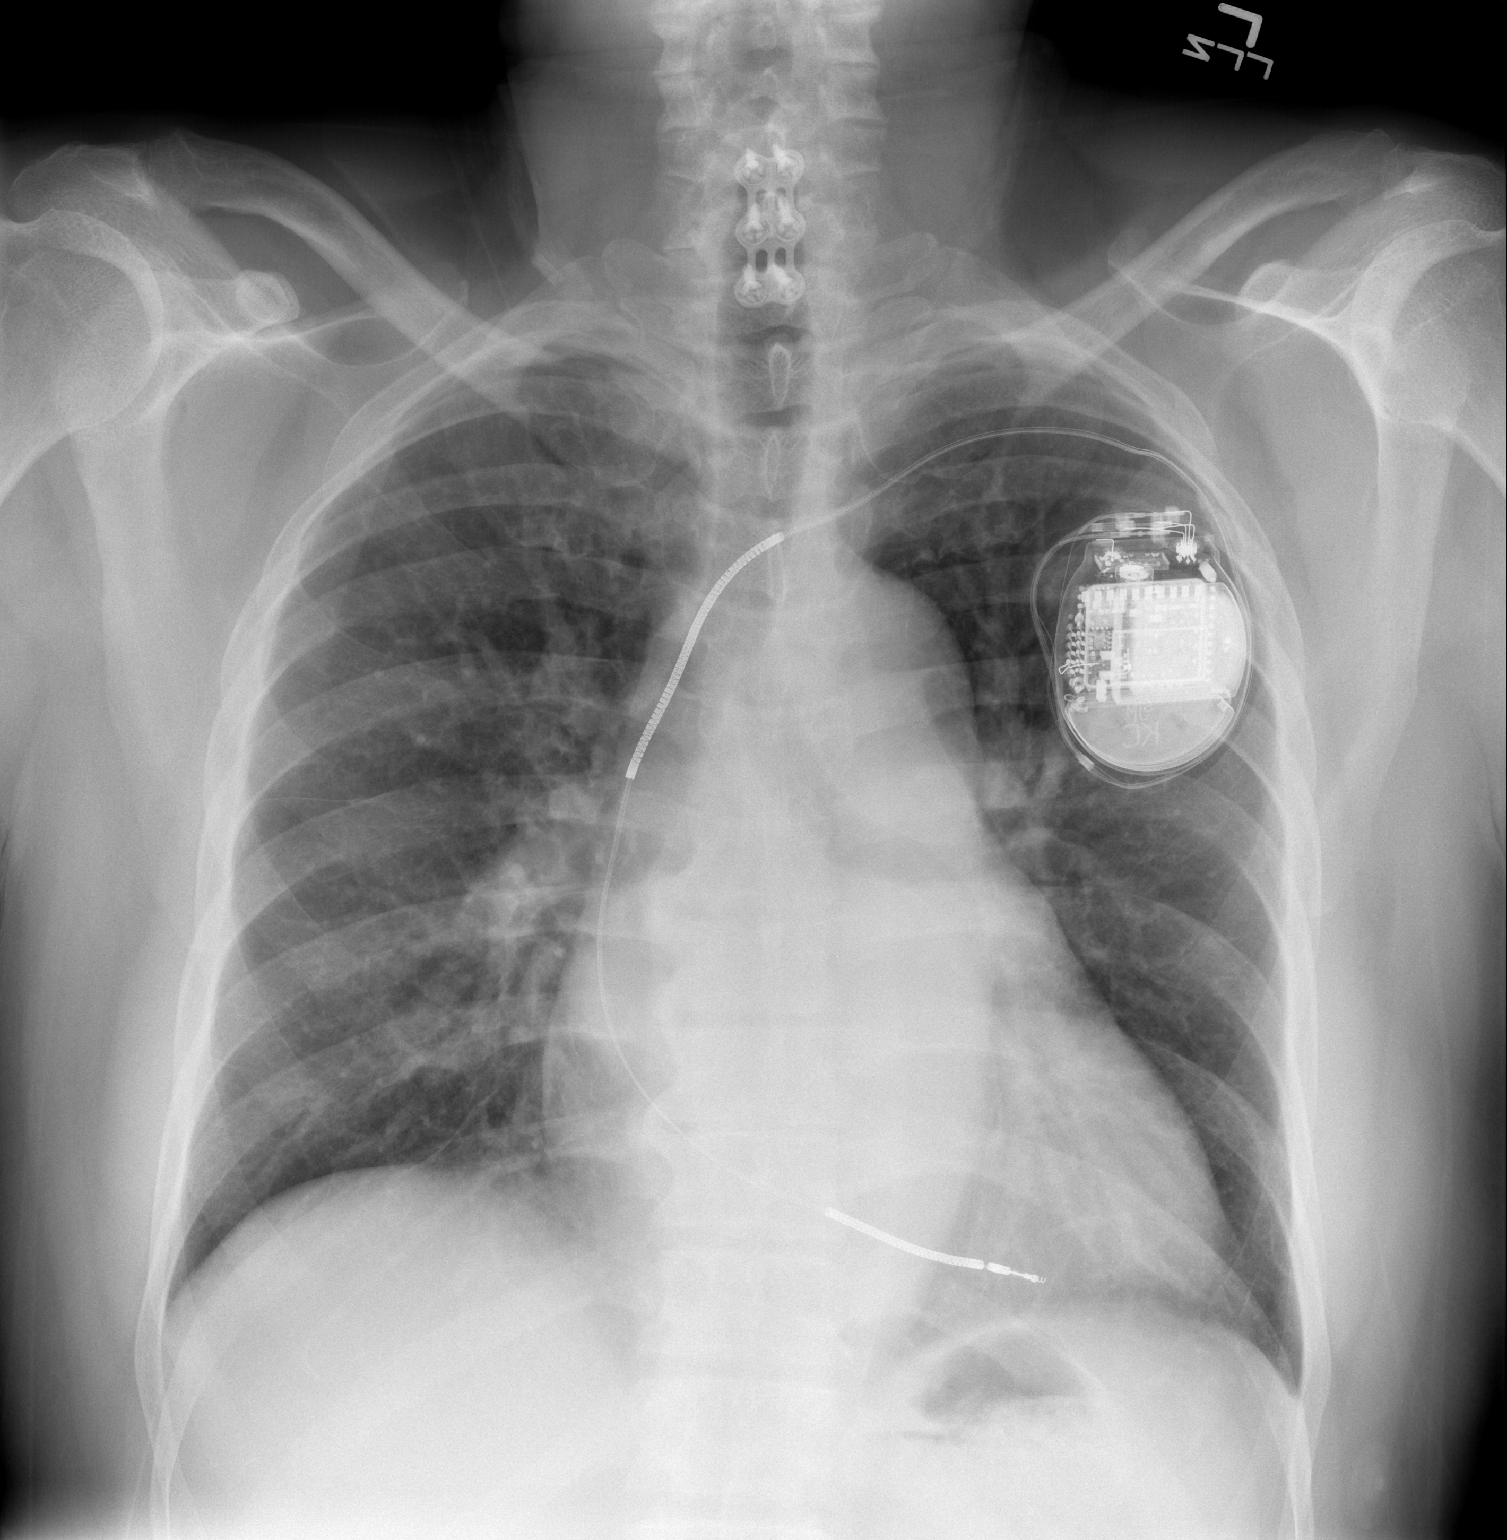

[w chest lat]
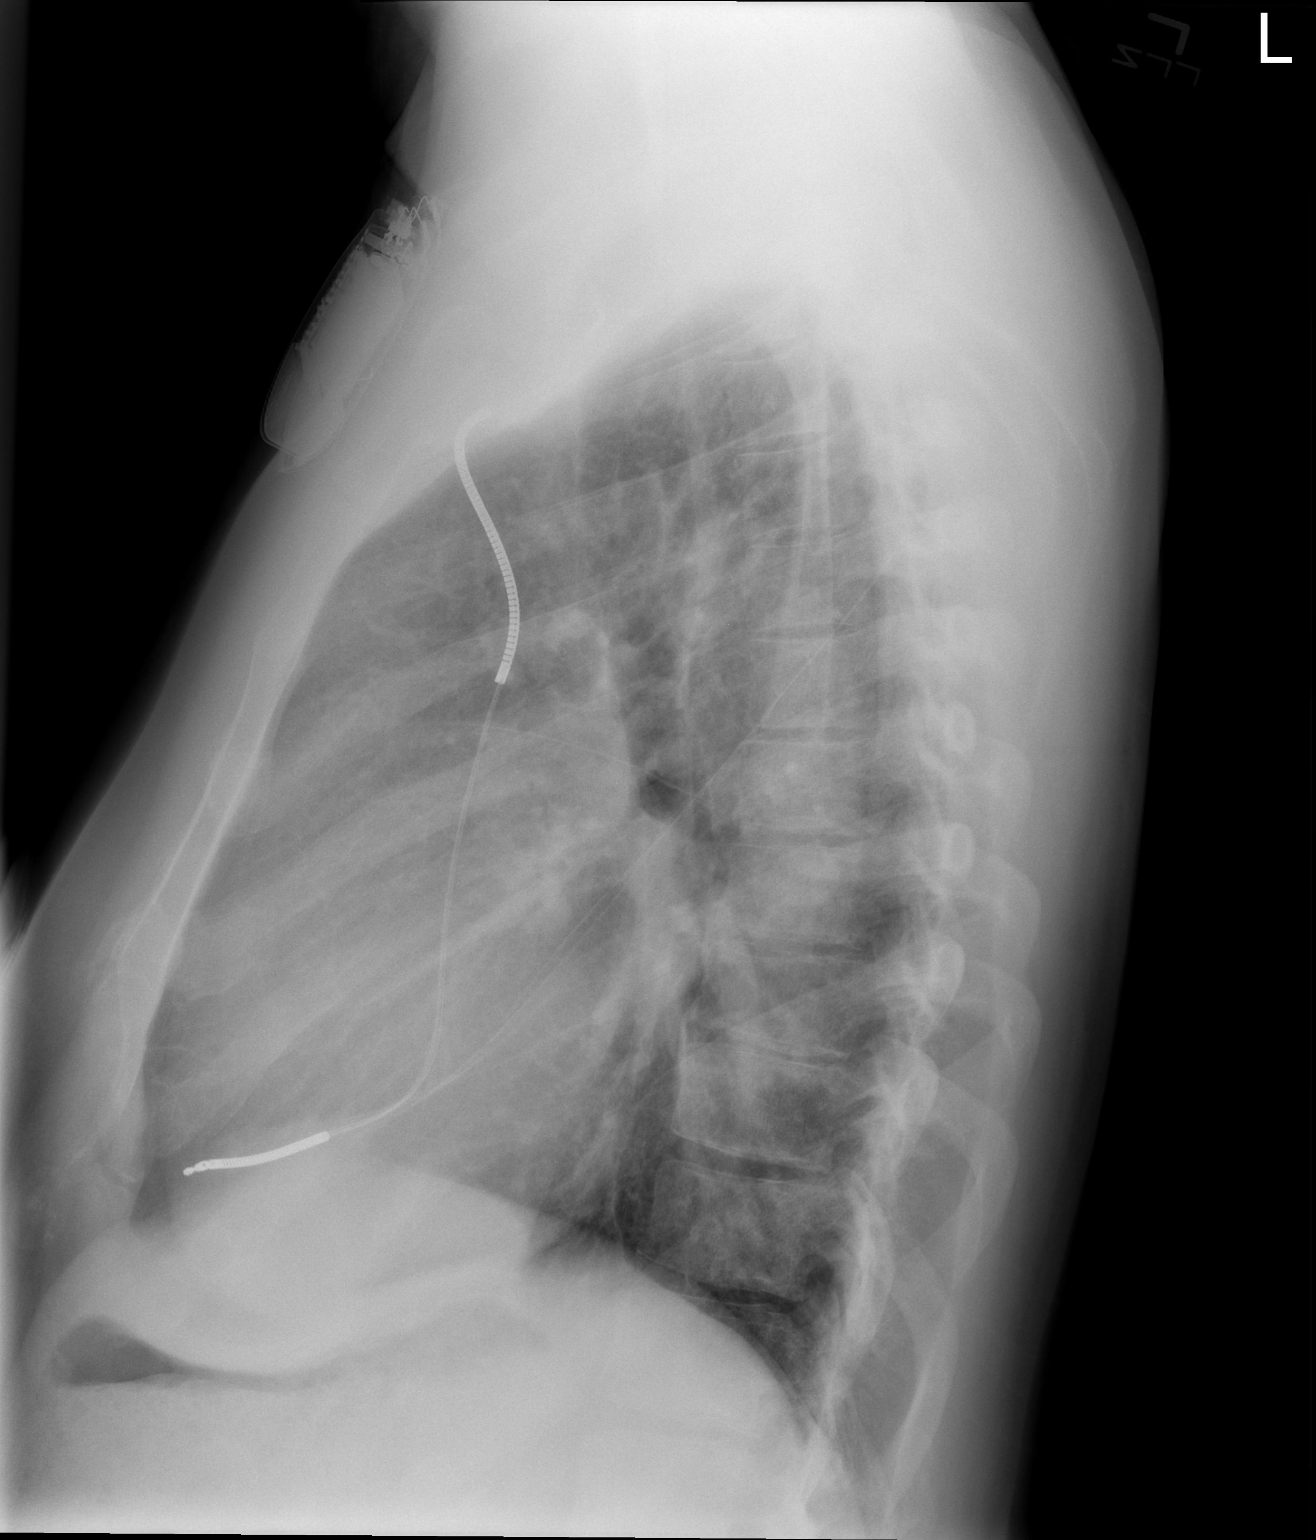

[2 of 2 positions shown; findings below may reference images not displayed]

FINDINGS: The lungs are well-aerated.  Mild vascular congestion is
noted.  There is no evidence of focal opacification, pleural
effusion or pneumothorax.

The heart is mildly enlarged.  An AICD is noted at the left chest
wall, with a single lead ending at the right ventricle.  No acute
osseous abnormalities are seen. Cervical spinal fusion hardware is
noted.
IMPRESSION: Mild vascular congestion and mild cardiomegaly; lungs remain
grossly clear.

## 2013-06-11 MED ORDER — SILDENAFIL CITRATE 100 MG PO TABS: 100.0000 mg | ORAL_TABLET | Freq: Every day | ORAL | Status: DC | PRN

## 2013-06-11 MED ORDER — TORSEMIDE 20 MG PO TABS
80.0000 mg | ORAL_TABLET | Freq: Every day | ORAL | Status: DC
Start: 2013-06-11 — End: 2013-08-18

## 2013-06-11 NOTE — Progress Notes (Signed)
EKG CRITICAL VALUE     12 lead EKG performed.  Critical value noted.  Kevan Rosebush, RN notified.   Laurell Josephs, [place] 06/11/2013 12:34 PM

## 2013-06-11 NOTE — Patient Instructions (Signed)
Increase Torsemide to 80 mg (4 tabs) daily  Your physician has recommended that you have a Cardioversion (DCCV). Electrical Cardioversion uses a jolt of electricity to your heart either through paddles or wired patches attached to your chest. This is a controlled, usually prescheduled, procedure. Defibrillation is done under light anesthesia in the hospital, and you usually go home the day of the procedure. This is done to get your heart back into a normal rhythm. You are not awake for the procedure. Please see the instruction sheet given to you today.  Your physician recommends that you schedule a follow-up appointment in: 1-2 weeks

## 2013-06-11 NOTE — Progress Notes (Signed)
  Echocardiogram 2D Echocardiogram has been performed.  Katrin Grabel 06/11/2013, 10:41 AM

## 2013-06-12 ENCOUNTER — Telehealth: Payer: Self-pay | Admitting: Family

## 2013-06-12 ENCOUNTER — Telehealth (HOSPITAL_COMMUNITY): Payer: Self-pay | Admitting: Cardiology

## 2013-06-12 NOTE — Telephone Encounter (Signed)
Went to the pain clinic and was given fentanyl 12mg  patch.  when he puts the patch on it gives him an explosive headache./  He cannot tolerate the patch.  He would like an rx for some pain meds until he can go back to the pain clinic on 6-18

## 2013-06-12 NOTE — Telephone Encounter (Signed)
Pt scheduled for DCCV on 06/13/13 Cpt code 92960 icd-9 code 427.31 With pts current insurance- [employer] No pre cert is required

## 2013-06-12 NOTE — Addendum Note (Signed)
Encounter addended by: Evalee Mutton, CCT on: 06/12/2013  9:43 AM<BR>     Documentation filed: Charges VN

## 2013-06-12 NOTE — Progress Notes (Signed)
Patient ID: [name], male   DOB: [birth year], 63 y.o.   MRN: [id number]  HPI: [name] is a 63 y.o. gentlemen with severe HF due to NICM (EF 20-25%) with multiple hospital admissions for HF exacerbations. He also has history of ventricular tachycardia s/p St Jude ICD by Dr. Lovena Le, CKD: stage IV, and paroxysmal atrial arrhythmias on amiodarone as well as sarcoidosis and hypertension. Cath in 2008 by Dr. Terrence Dupont showed no CAD.Seen at [facility] and not felt to be a transplant candidate due to renal failure and lack of family support.  Milrinone initiated in January 2014 for low output. 03/01/12 milrinone increased 0.360mc/kg/min.   ECHO 03/22/12 EF 15%   04/26/12 S/P Successful DC-CV for atrial fibrillation. Remains on amiodarone.   RHC 04/30/13 RA = 4  RV = 20/1/3  PA = 23/8 (15)  PCW = 5  Fick cardiac output/index =4.5/1.9  Thermo CO/CI = 3.9/1.6  PVR =  FA sat = 98%  PA sat = 62%, 64% - On milrinone 0.25  He returns as an add-on. On chronic milrinone at 0.375 mcg/kg/min.  He was seen in the ER 2 nights ago with chest pain and tachypalpitations.  He developed severe chest pain that evening so went to the ER for evaluation.  HR was in the 140s-150s.  Initial ECG appears to show atrial tachycardia at 143 bpm.  HR decreased with an IV amiodarone bolus and repeat ECG appeared to show atrial fibrillation at a rate of 109.  Today, ECG shows possible atrial tachycardia at rate of 102.  Chest pain is considerably decreased with slower heart rate but he still has some dull right-sided chest pain.  He has worse abdominal distention and a cough.  He has been taking metolazone more, twice in the last week.  Dyspnea is worse, he is short of breath after walking 30-40 feet.  CXR in the ER showed no active disease. Weight is stable compared to prior appointment.   He is not on an ace inhibitor due to elevated creatinine. Not on nitrates due to Viagra use.   Labs:  08/15/12 Creatinine 4.24 Potassium  3.3 09/19/12 Creatinine 3.98, Potassium 3.1, BUN 78 10/07/12 Creatinine 2.62, Potassium 3.4 10/18/12 Creatinine 3.3, K+3.1 11/08/12 Creatinine 2.15 K 4.0 11/25/12 Creatinine 2.93, K+ 3.5 03/10/13: K+ 2.9, Creatinine 2.63 04/30/13: K+ 3.7, creatinine 2.4 6/15: K 3, creatinine 3.6  ROS: All systems negative except as listed in HPI, PMH and Problem List.  Past Medical History  Diagnosis Date  . CHF (congestive heart failure)   . Sarcoidosis   . Cardiomyopathy, dilated, nonischemic     non ischemic by cath  . Acute on chronic systolic heart failure   . Automatic implantable cardiac defibrillator in situ   . Atrial fibrillation   . NSVT (nonsustained ventricular tachycardia)   . GERD (gastroesophageal reflux disease)   . Hypercholesteremia   . Myocardial infarction   . Shortness of breath   . Chronic kidney disease (CKD), stage III (moderate)   . Pacemaker   . Anginal pain   . Gout   . Hypertension     dr [name]  . Coronary artery disease    Current Outpatient Prescriptions  Medication Sig Dispense Refill  . allopurinol (ZYLOPRIM) 100 MG tablet Take 1 tablet (100 mg total) by mouth daily.  30 tablet  6  . ALPRAZolam (XANAX) 0.5 MG tablet Take 0.5 mg by mouth 3 (three) times daily as needed for anxiety.      Marland Kitchen amiodarone (  PACERONE) 200 MG tablet Take 200 mg by mouth 2 (two) times daily.      Marland Kitchen apixaban (ELIQUIS) 5 MG TABS tablet Take 1 tablet (5 mg total) by mouth 2 (two) times daily.  60 tablet  4  . carvedilol (COREG) 3.125 MG tablet Take 3.125 mg by mouth 2 (two) times daily with a meal.      . eplerenone (INSPRA) 25 MG tablet Take 0.5 tablets (12.5 mg total) by mouth daily.  15 tablet  6  . esomeprazole (NEXIUM) 40 MG capsule Take 1 capsule (40 mg total) by mouth daily.  30 capsule  3  . fluticasone (FLONASE) 50 MCG/ACT nasal spray Place 2 sprays into both nostrils daily as needed for allergies.       . Gabapentin, PHN, (GRALISE STARTER) 300 & 600 MG MISC Take by mouth.      .  hydrOXYzine (ATARAX/VISTARIL) 25 MG tablet Take 25 mg by mouth 3 (three) times daily as needed for itching.      Marland Kitchen LORazepam (ATIVAN) 0.5 MG tablet Take 1-2 tablets (0.5-1 mg total) by mouth 2 (two) times daily as needed for anxiety.  50 tablet  0  . metolazone (ZAROXOLYN) 2.5 MG tablet Take 1 tablet (2.5 mg total) by mouth daily as needed (for fluid retention).  10 tablet  1  . milrinone (PRIMACOR) 20 MG/100ML SOLN infusion Inject 0.375 mcg/kg/min into the vein continuous. 440mg  in 485ml infusion 2.5 ml per hour      . Oxycodone HCl 20 MG TABS Take 1 tablet (20 mg total) by mouth 3 (three) times daily as needed.  90 tablet  0  . potassium chloride SA (K-DUR,KLOR-CON) 20 MEQ tablet Take 60 mEq by mouth 2 (two) times daily.      . promethazine (PHENERGAN) 25 MG tablet Take 25 mg by mouth every 6 (six) hours as needed for nausea or vomiting.      . ranitidine (ZANTAC) 150 MG capsule Take 150 mg by mouth 2 (two) times daily.      Marland Kitchen senna-docusate (SENOKOT-S) 8.6-50 MG per tablet Take 1-2 tablets by mouth 2 (two) times daily as needed for mild constipation or moderate constipation.      . sertraline (ZOLOFT) 50 MG tablet Take 50 mg by mouth daily.      . sildenafil (VIAGRA) 100 MG tablet Take 1 tablet (100 mg total) by mouth daily as needed for erectile dysfunction.  5 tablet  1  . torsemide (DEMADEX) 20 MG tablet Take 4 tablets (80 mg total) by mouth daily.  120 tablet  3  . zolpidem (AMBIEN) 5 MG tablet Take 5 mg by mouth at bedtime as needed for sleep.       No current facility-administered medications for this encounter.   Filed Vitals:   06/11/13 1200  BP: 102/80  Pulse: 105  Weight: 252 lb 6.4 oz (114.488 kg)  SpO2: 96%    PHYSICAL EXAM: General: NAD; chronically ill appearing. NAD HEENT: normal  Neck: supple. JVP 8-9 cm. Carotids 2+ bilat; no bruits. No lymphadenopathy or thyromegaly appreciated.  Cor: PMI laterally displaced. Regular S1 S2 with +S3 and 2/6 HSM apex. R upper chest  hickman site with Milrinone infusing.  Lungs: clear Abdomen: soft, nontender, obese. No bruits or masses. Good bowel sounds.  Mild abdominal distention.  Extremities: no cyanosis, clubbing, rash, no lower extremity edema  Neuro: alert & orientedx3, cranial nerves grossly intact. moves all 4 extremities w/o difficulty. Affect pleasant   ASSESSMENT & PLAN:  1) Chronic systolic HF: NICM, EF 14% (03/2012). St Jude ICD. He has been on home milrinone 0.375 mcg/kg/min over 1 year.  He is doing worse symptomatically.  This may correlate with atrial tachycardia.  He was in atrial tachycardia initially in the ER 2 nights ago with fast rate.  Followup ECG appeared to be atrial fib versus AT, and today's ECG looks like possible atrial tachycardia.  NYHA class IIIb symptoms. I suspect that he has some volume overload.  - Continue low dose coreg 3.125 mg BID - Continue Inspra 12.5 mg daily  - Continue milrinone 0.375 mcg through hickman cath - Increase torsemide to 80 mg daily, will get BMET today and in a week.  - No ACEI with CKD - As I think worsened symptoms correspond to onset of atrial arrhythmias, I will tentatively schedule him for a cardioversion later this week.  He has been on Eliquis without missing doses.  However, he has also expressed interest in turning off ICD and stopping milrinone, going back with hospice.  I would like him to have this conversation with Dr. Haroldine Laws who knows him well.  Will schedule cardioversion with Dr. Haroldine Laws later this week.  2) Atrial arrhythmias: Paroxysmal.  As above, atrial tachycardia onset may be the cause of worsened symptoms. He has been on Eliquis without missing doses.  - As above, will plan for DCCV later this week.  - He will continue amiodarone 400 mg bid.   3) CKD, stage IV: Creatinine a bit higher in ER recently, will repeat BMET today.  4) Chronic pain: Per pain clinic.   Loralie Champagne 06/12/2013

## 2013-06-13 ENCOUNTER — Encounter (HOSPITAL_COMMUNITY)
Admission: RE | Disposition: A | Discharge status: Home / Self Care | Payer: Self-pay | Source: Ambulatory Visit | Attending: Internal Medicine

## 2013-06-13 ENCOUNTER — Encounter (HOSPITAL_COMMUNITY): Payer: Medicare Other | Admitting: Certified Registered"

## 2013-06-13 ENCOUNTER — Encounter (HOSPITAL_COMMUNITY): Payer: Self-pay | Admitting: Certified Registered"

## 2013-06-13 ENCOUNTER — Ambulatory Visit (HOSPITAL_COMMUNITY)
Admission: RE | Admit: 2013-06-13 | Discharge: 2013-06-13 | Disposition: A | Discharge status: Home / Self Care | Payer: PRIVATE HEALTH INSURANCE | Source: Ambulatory Visit | Attending: Internal Medicine | Admitting: Internal Medicine

## 2013-06-13 DIAGNOSIS — Z5309 Procedure and treatment not carried out because of other contraindication: Secondary | ICD-10-CM | POA: Insufficient documentation

## 2013-06-13 SURGERY — CANCELLED PROCEDURE

## 2013-06-13 NOTE — Telephone Encounter (Signed)
Left message with spouse for pt to return my call. 

## 2013-06-13 NOTE — Progress Notes (Signed)
12 lead EKG performed precardioversion, patient is in Sinus rhythm. Notified Dr Haroldine Laws, cardioversion cancelled per MD.

## 2013-06-13 NOTE — Telephone Encounter (Signed)
No answer at home #. Unable to reach pt cell# (non-working).

## 2013-06-13 NOTE — Telephone Encounter (Signed)
Spoke with pt and advised him to notify his pain management clinic as they are managing chronic pain meds for him and it will violate his contract with them if we try to prescribe or change his pain medication. Pt voices understanding and states he has an appt with pain management on 06/18/13 and will discuss it with them if he can't get in touch with them before that time.

## 2013-06-16 ENCOUNTER — Other Ambulatory Visit (HOSPITAL_COMMUNITY): Payer: Self-pay

## 2013-06-16 ENCOUNTER — Encounter: Payer: Self-pay | Admitting: Anesthesiology

## 2013-06-16 ENCOUNTER — Telehealth (HOSPITAL_COMMUNITY): Payer: Self-pay

## 2013-06-16 MED ORDER — SPIRONOLACTONE 25 MG PO TABS: 12.5000 mg | ORAL_TABLET | Freq: Every day | ORAL | Status: DC

## 2013-06-16 NOTE — Telephone Encounter (Signed)
Lab results reviewed with patient, instructed to take extra 60 meq potassium today and to also begin taking 12.5 mg sprinolactone once daily.  Aware and agreeable.  Rx sent to preferred pharmacy electronically. [name] Pain

## 2013-06-17 ENCOUNTER — Telehealth (HOSPITAL_COMMUNITY): Payer: Self-pay | Admitting: *Deleted

## 2013-06-17 NOTE — Telephone Encounter (Signed)
Miranda, pharm D with [facility] called to let us know pt's wt is up to 254 lb today, yesterday he was 250 and last week he was 245 lb, he denies increased SOB or edema, she advised him to take Metolazone since he has not taken it recently, he is sch to see [place] in clinic tomorrow

## 2013-06-18 ENCOUNTER — Ambulatory Visit (HOSPITAL_COMMUNITY)
Admission: RE | Admit: 2013-06-18 | Discharge: 2013-06-18 | Disposition: A | Discharge status: Home / Self Care | Payer: Medicare Other | Source: Ambulatory Visit | Attending: Internal Medicine | Admitting: Internal Medicine

## 2013-06-18 ENCOUNTER — Encounter (HOSPITAL_COMMUNITY): Payer: Self-pay

## 2013-06-18 VITALS — BP 114/92 | HR 92 | Wt 253.1 lb

## 2013-06-18 DIAGNOSIS — I4891 Unspecified atrial fibrillation: Secondary | ICD-10-CM | POA: Insufficient documentation

## 2013-06-18 DIAGNOSIS — Z950 Presence of cardiac pacemaker: Secondary | ICD-10-CM | POA: Insufficient documentation

## 2013-06-18 DIAGNOSIS — S46009A Unspecified injury of muscle(s) and tendon(s) of the rotator cuff of unspecified shoulder, initial encounter: Secondary | ICD-10-CM

## 2013-06-18 DIAGNOSIS — N185 Chronic kidney disease, stage 5: Secondary | ICD-10-CM | POA: Diagnosis not present

## 2013-06-18 DIAGNOSIS — K219 Gastro-esophageal reflux disease without esophagitis: Secondary | ICD-10-CM | POA: Diagnosis not present

## 2013-06-18 DIAGNOSIS — I120 Hypertensive chronic kidney disease with stage 5 chronic kidney disease or end stage renal disease: Secondary | ICD-10-CM | POA: Diagnosis not present

## 2013-06-18 DIAGNOSIS — Z79899 Other long term (current) drug therapy: Secondary | ICD-10-CM | POA: Diagnosis not present

## 2013-06-18 DIAGNOSIS — I509 Heart failure, unspecified: Secondary | ICD-10-CM | POA: Diagnosis not present

## 2013-06-18 DIAGNOSIS — I428 Other cardiomyopathies: Secondary | ICD-10-CM | POA: Diagnosis not present

## 2013-06-18 DIAGNOSIS — M25511 Pain in right shoulder: Secondary | ICD-10-CM

## 2013-06-18 DIAGNOSIS — I5022 Chronic systolic (congestive) heart failure: Secondary | ICD-10-CM | POA: Insufficient documentation

## 2013-06-18 DIAGNOSIS — I480 Paroxysmal atrial fibrillation: Secondary | ICD-10-CM

## 2013-06-18 DIAGNOSIS — M25519 Pain in unspecified shoulder: Secondary | ICD-10-CM

## 2013-06-18 DIAGNOSIS — R079 Chest pain, unspecified: Secondary | ICD-10-CM

## 2013-06-18 NOTE — Progress Notes (Signed)
Patient ID: [name], male   DOB: September 18, 1950, 63 y.o.   MRN: [id number]  HPI: [name] is a 63 y.o. gentlemen with severe HF due to NICM (EF 20-25%) with multiple hospital admissions for HF exacerbations. [name] also has history of ventricular tachycardia s/p St Jude ICD by Dr. Lovena Le, CKD: stage IV (Cr 3.0-3.5), and paroxysmal atrial arrhythmias on amiodarone as well as sarcoidosis and hypertension. Cath in 2008 by Dr. Terrence Dupont showed no CAD.Seen at [facility] and not felt to be a transplant candidate due to renal failure and lack of family support.  Milrinone initiated in January 2014 for low output. 03/01/12 milrinone increased 0.31mc/kg/min.   ECHO 03/22/12 EF 15%   04/26/12 S/P Successful DC-CV for atrial fibrillation. Remains on amiodarone.   RHC 04/30/13 RA = 4  RV = 20/1/3  PA = 23/8 (15)  PCW = 5  Fick cardiac output/index =4.5/1.9  Thermo CO/CI = 3.9/1.6  PVR =  FA sat = 98%  PA sat = 62%, 64% - On milrinone 0.25  [name] returns as an add-on. On chronic milrinone at 0.63 mcg/kg/min [name] saw Dr. Aundra Dubin last week. Feeling very poorly. Found to be back in atrial tachycardia. Arranged for DC-CV on 6/12. Arrived for DC-CV but was back in NSR. For past few weeks has had constant right-sided going to neck and arm. Not worse with exertion. Hurts more when [name] lies on his back or if [name] turns his neck. Denies injury. Weight unchanged. But feels bloated. Very fatigued. No edema. On good days can walk a few hundred feet on bad days just sits around all day. Saw Pain Clinic and prescribed Oxycodone.  [name] is not on an ace inhibitor due to elevated creatinine. Not on nitrates due to Viagra use.   Labs:  08/15/12 Creatinine 4.24 Potassium 3.3 09/19/12 Creatinine 3.98, Potassium 3.1, BUN 78 10/07/12 Creatinine 2.62, Potassium 3.4 10/18/12 Creatinine 3.3, K+3.1 11/08/12 Creatinine 2.15 K 4.0 11/25/12 Creatinine 2.93, K+ 3.5 03/10/13: K+ 2.9, Creatinine 2.63 04/30/13: K+ 3.7, creatinine 2.4 6/15: K 3,  creatinine 3.6  ROS: All systems negative except as listed in HPI, PMH and Problem List.  Past Medical History  Diagnosis Date  . CHF (congestive heart failure)   . Sarcoidosis   . Cardiomyopathy, dilated, nonischemic     non ischemic by cath  . Acute on chronic systolic heart failure   . Automatic implantable cardiac defibrillator in situ   . Atrial fibrillation   . NSVT (nonsustained ventricular tachycardia)   . GERD (gastroesophageal reflux disease)   . Hypercholesteremia   . Myocardial infarction   . Shortness of breath   . Chronic kidney disease (CKD), stage III (moderate)   . Pacemaker   . Anginal pain   . Gout   . Hypertension     dr [name]  . Coronary artery disease    Current Outpatient Prescriptions  Medication Sig Dispense Refill  . allopurinol (ZYLOPRIM) 100 MG tablet Take 1 tablet (100 mg total) by mouth daily.  30 tablet  6  . ALPRAZolam (XANAX) 0.5 MG tablet Take 0.5 mg by mouth 3 (three) times daily as needed for anxiety.      Marland Kitchen amiodarone (PACERONE) 200 MG tablet Take 200 mg by mouth 2 (two) times daily.      Marland Kitchen apixaban (ELIQUIS) 5 MG TABS tablet Take 1 tablet (5 mg total) by mouth 2 (two) times daily.  60 tablet  4  . carvedilol (COREG) 3.125 MG tablet Take 3.125 mg by  mouth 2 (two) times daily with a meal.      . eplerenone (INSPRA) 25 MG tablet Take 0.5 tablets (12.5 mg total) by mouth daily.  15 tablet  6  . esomeprazole (NEXIUM) 40 MG capsule Take 1 capsule (40 mg total) by mouth daily.  30 capsule  3  . fluticasone (FLONASE) 50 MCG/ACT nasal spray Place 2 sprays into both nostrils daily as needed for allergies.       . Gabapentin, PHN, (GRALISE STARTER) 300 & 600 MG MISC Take by mouth.      . hydrOXYzine (ATARAX/VISTARIL) 25 MG tablet Take 25 mg by mouth 3 (three) times daily as needed for itching.      Marland Kitchen LORazepam (ATIVAN) 0.5 MG tablet Take 1-2 tablets (0.5-1 mg total) by mouth 2 (two) times daily as needed for anxiety.  50 tablet  0  . metolazone  (ZAROXOLYN) 2.5 MG tablet Take 1 tablet (2.5 mg total) by mouth daily as needed (for fluid retention).  10 tablet  1  . milrinone (PRIMACOR) 20 MG/100ML SOLN infusion Inject 0.375 mcg/kg/min into the vein continuous. 440mg  in 435ml infusion 2.5 ml per hour      . Oxycodone HCl 20 MG TABS Take 1 tablet (20 mg total) by mouth 3 (three) times daily as needed.  90 tablet  0  . potassium chloride SA (K-DUR,KLOR-CON) 20 MEQ tablet Take 60 mEq by mouth 2 (two) times daily.      . promethazine (PHENERGAN) 25 MG tablet Take 25 mg by mouth every 6 (six) hours as needed for nausea or vomiting.      . ranitidine (ZANTAC) 150 MG capsule Take 150 mg by mouth 2 (two) times daily.      Marland Kitchen senna-docusate (SENOKOT-S) 8.6-50 MG per tablet Take 1-2 tablets by mouth 2 (two) times daily as needed for mild constipation or moderate constipation.      . sertraline (ZOLOFT) 50 MG tablet Take 50 mg by mouth daily.      . sildenafil (VIAGRA) 100 MG tablet Take 1 tablet (100 mg total) by mouth daily as needed for erectile dysfunction.  5 tablet  1  . spironolactone (ALDACTONE) 25 MG tablet Take 0.5 tablets (12.5 mg total) by mouth daily.  20 tablet  3  . torsemide (DEMADEX) 20 MG tablet Take 4 tablets (80 mg total) by mouth daily.  120 tablet  3  . zolpidem (AMBIEN) 5 MG tablet Take 5 mg by mouth at bedtime as needed for sleep.       No current facility-administered medications for this encounter.   Filed Vitals:   06/18/13 1552  BP: 114/92  Pulse: 92  Weight: 253 lb 1.9 oz (114.814 kg)  SpO2: 96%    PHYSICAL EXAM: General: NAD; chronically ill appearing.  HEENT: normal  Neck: supple. JVP 5-6 cm. Carotids 2+ bilat; no bruits. No lymphadenopathy or thyromegaly appreciated.  Cor: PMI laterally displaced. Regular S1 S2 with +S3 and 2/6 HSM apex. R upper chest hickman site with Milrinone infusing.  Lungs: clear Abdomen: soft, nontender, obese. No bruits or masses. Good bowel sounds.  Mild abdominal distention.   Extremities: no cyanosis, clubbing, rash, no lower extremity edema . Painful ROM in R shoulder with all rotator cuff movements Neuro: alert & orientedx3, cranial nerves grossly intact. moves all 4 extremities w/o difficulty. Affect pleasant   ASSESSMENT & PLAN:   1) Chest pain:  I suspect [name] has a rotator cuff tear. Not surgical candidate. Will refer to PT for  further evaluatoin 1) Chronic systolic HF: NICM, EF 20% (03/2012). St Jude ICD. [name] has been on home milrinone 0.375 mcg/kg/min over 1 year.  Stable  NYHA class IIIb symptoms. Volume status Ok - [name] was supposed to come in today to discuss possibly stopping inotropes and returning to Hospice care but [name] says [name] is feeling better and would like to continue milrinone.  - Continue low dose coreg 3.125 mg BID - Continue Inspra 12.5 mg daily  - Continue milrinone 0.375 mcg through hickman cath - Continue torsemide 80 mg daily - No ACEI with CKD  2) Atrial arrhythmias: - [name] is back in NSR - [name] will continue amiodarone 400 mg bid and Eliquis.   3) CKD, stage IV: stable 4) Chronic pain: Per pain clinic.   Glori Bickers MD 06/18/2013

## 2013-06-18 NOTE — Patient Instructions (Signed)
Your physician recommends that you schedule a follow-up appointment in: 2 months  Do the following things EVERYDAY: 1) Weigh yourself in the morning before breakfast. Write it down and keep it in a log. 2) Take your medicines as prescribed 3) Eat low salt foods-Limit salt (sodium) to 2000 mg per day.  4) Stay as active as you can everyday 5) Limit all fluids for the day to less than 2 liters 6)   

## 2013-06-19 DIAGNOSIS — S46009A Unspecified injury of muscle(s) and tendon(s) of the rotator cuff of unspecified shoulder, initial encounter: Secondary | ICD-10-CM | POA: Insufficient documentation

## 2013-06-20 ENCOUNTER — Other Ambulatory Visit: Payer: Self-pay | Admitting: Family

## 2013-06-20 NOTE — Telephone Encounter (Signed)
eScribe request from [facility] for refill on Zolpidem Last filled -  Last AEX -  Next AEX -

## 2013-06-22 IMAGING — CT CT ABD-PELV W/O CM
2 of 4 series · 16 of 46 positions shown, 18 images · non-contrast
Comparison: None.

CLINICAL DATA: Chest pain and epigastric pain with shortness of
breath for 2 days.

CT ABDOMEN AND PELVIS WITHOUT CONTRAST
TECHNIQUE: Multidetector CT imaging of the abdomen and pelvis was
performed following the standard protocol without intravenous
contrast.

[Series 2: abd/pelv w/o 5.0 b31f st · axial · non-contrast · 0.92mm/px · z∈[-508,-58]mm · 13 of 98 slices shown, 15 images]
[im 4/98  soft-tissue]
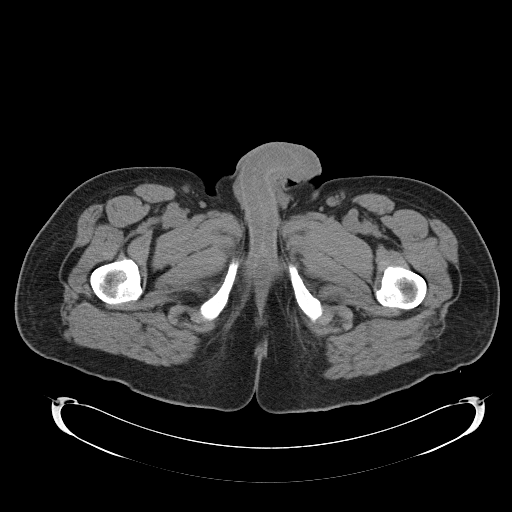
[im 4/98  bone]
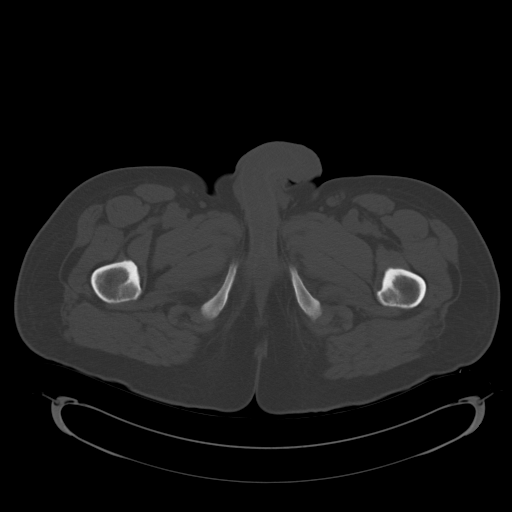
[im 12/98  soft-tissue]
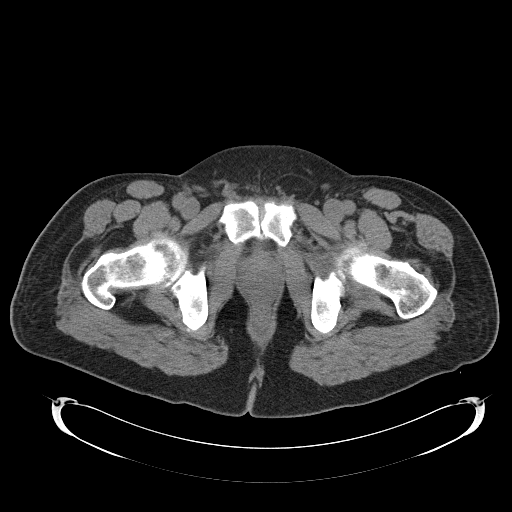
[im 20/98  soft-tissue]
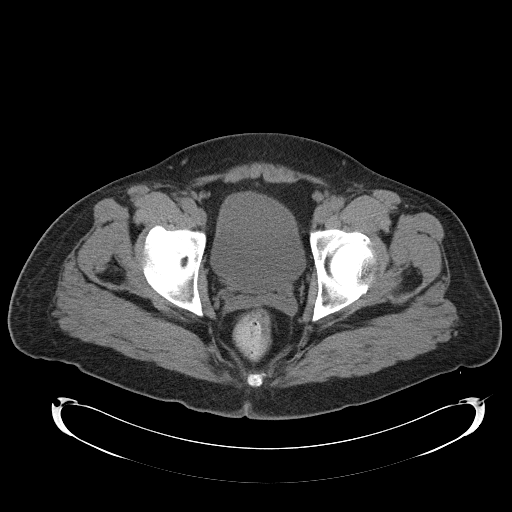
[im 28/98  soft-tissue]
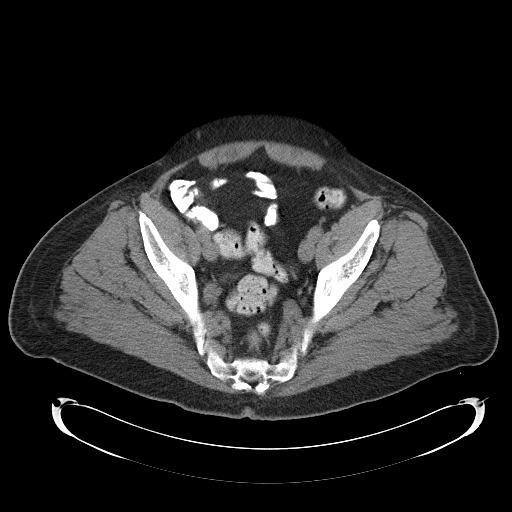
[im 35/98  soft-tissue]
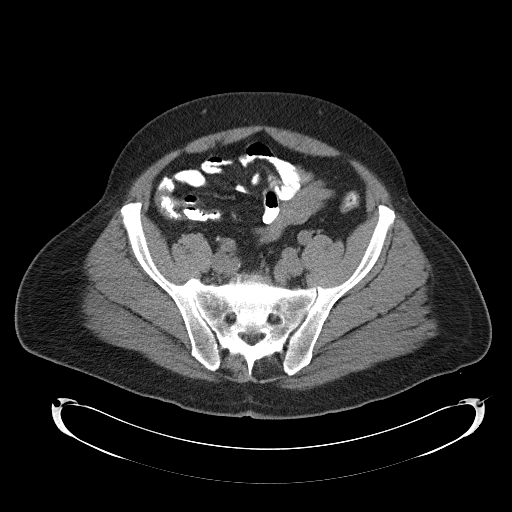
[im 43/98  soft-tissue]
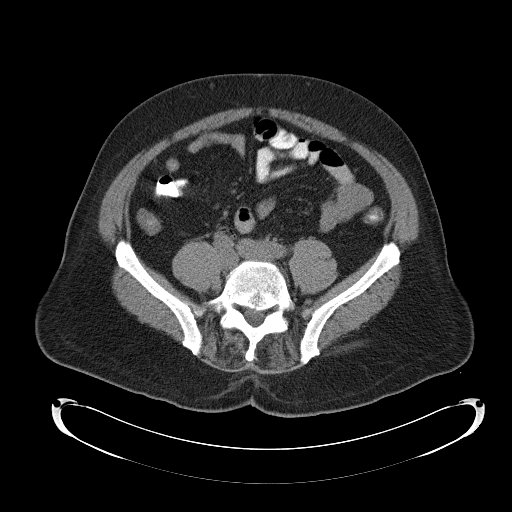
[im 51/98  soft-tissue]
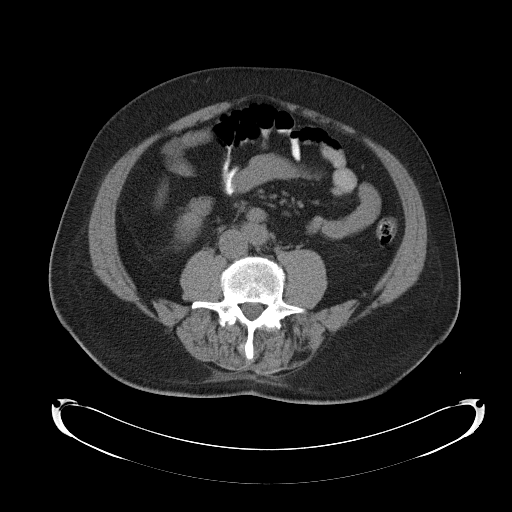
[im 55/98  soft-tissue]
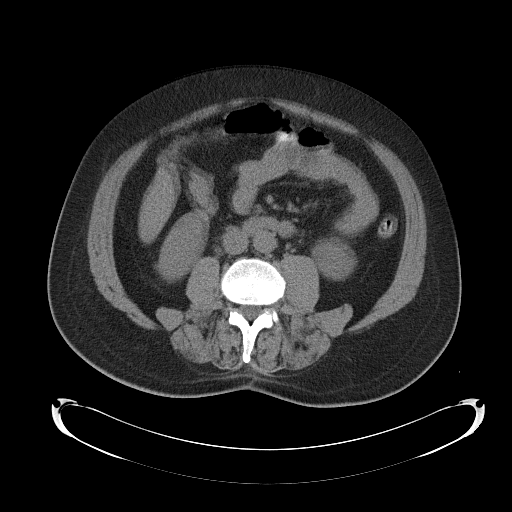
[im 63/98  soft-tissue]
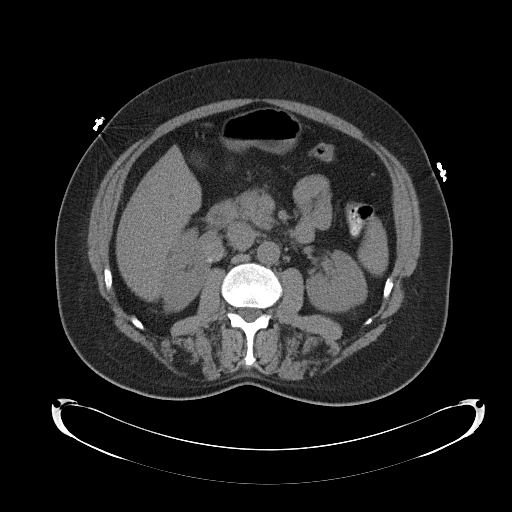
[im 63/98  bone]
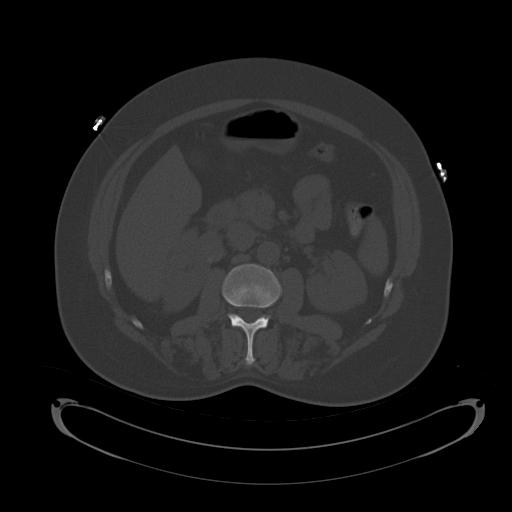
[im 70/98  soft-tissue]
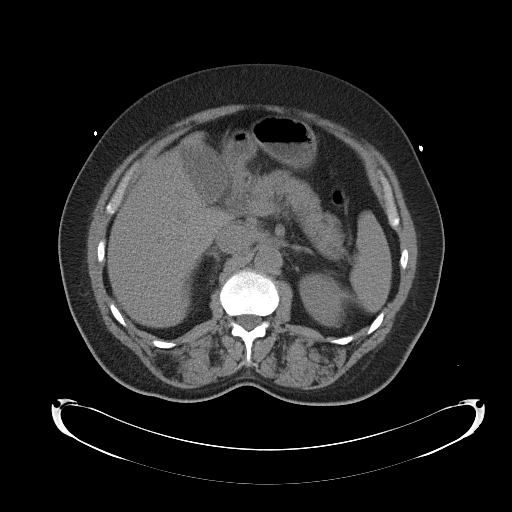
[im 78/98  soft-tissue]
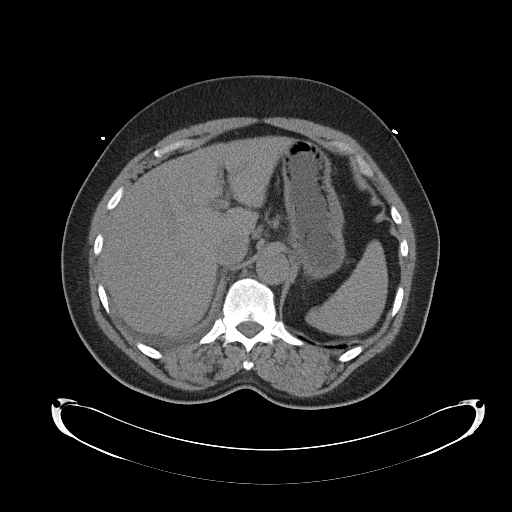
[im 86/98  soft-tissue]
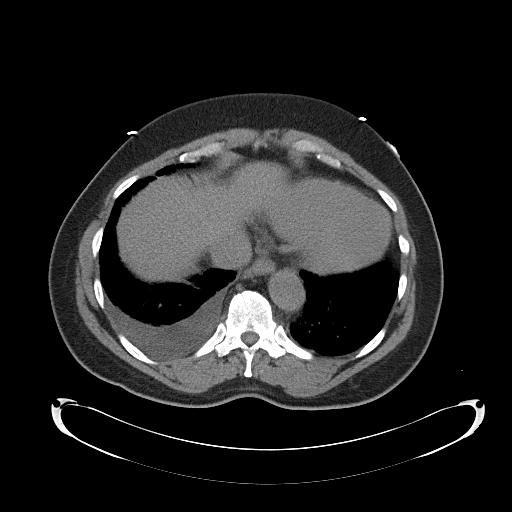
[im 94/98  soft-tissue]
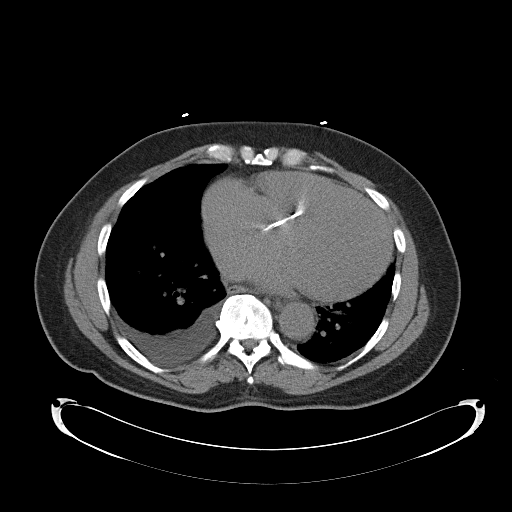

[Series 5: coronals · coronal · 0.93mm/px · 3 of 133 slices shown]
[im 45/133  soft-tissue]
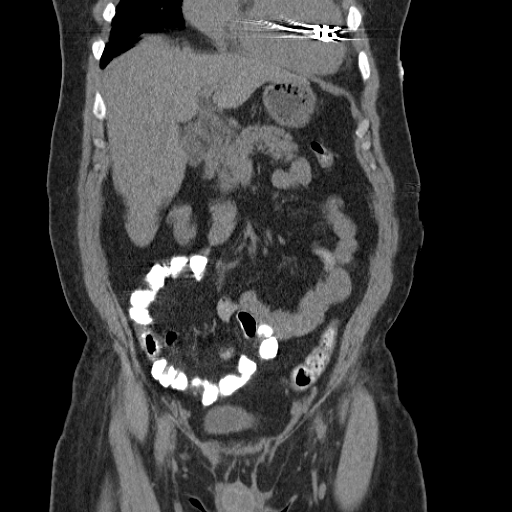
[im 59/133  soft-tissue]
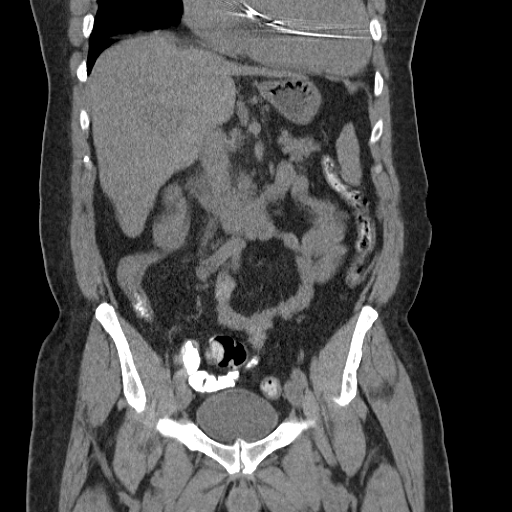
[im 74/133  soft-tissue]
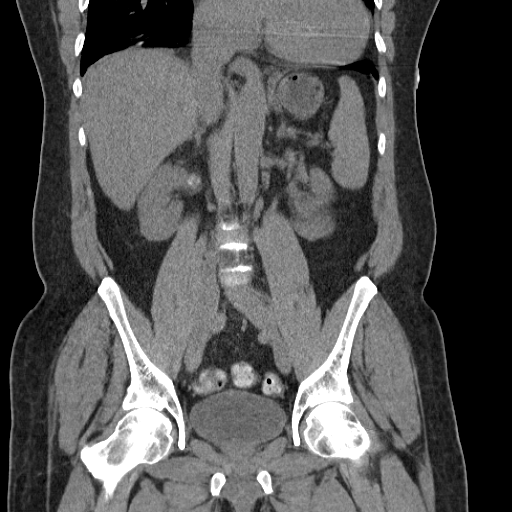

[16 of 46 positions shown; findings below may reference images not displayed]

FINDINGS: Cardiac enlargement.  A transvenous pacer lead terminates
in the right ventricle.  There is a right pleural effusion.
Compressive atelectasis noted right base..  No infiltrates.

Unenhanced appearance of the liver, spleen, gallbladder, and
pancreas is unremarkable.  No renal obstruction.

There is a hyperdense 29 x 28 by 28 mm lesion adjacent to the upper
pole of the right kidney projecting medially toward the IVC.  It is
inferior to the adrenal gland.  There is some faint peripheral
calcification.  Concern is raised for a renal neoplasm, either
benign or malignant, versus a vascular structure related to the
renal vein or renal artery. It is unlikely to represent a lymph
node.  MRI of the abdomen could be performed for further
evaluation.  The patient's GFR is currently too low to allow
administration of Gadolinium but if the renal function improves, a
low dose Gadolinium agent could be employed.  No similar lesions on
the left.

Mild aortic atherosclerosis without aneurysmal dilatation.  No free
fluid or free air.  Colonic diverticulosis without diverticulitis.
No bowel obstruction.  Unremarkable prostate and seminal vesicles.
No distal ureteral calculi or bladder outlet obstruction.  Lumbar
degenerative disc disease.  No worrisome osseous lesions.
IMPRESSION: Hyperdense 28 x 28 x 29 mm lesion adjacent to the upper pole the
right kidney could represent an abnormal vascular structure or a
neoplasm.  Elective MRI of the abdomen could be performed to
further evaluate as clinically indicated.

No obstructive uropathy, free fluid, or free air.

Cardiomegaly with right pleural effusion, question early or
resolving CHF.

## 2013-06-22 IMAGING — CR DG CHEST 1V PORT
1 series · 1 of 1 positions shown · non-contrast
Comparison: 12/29/2011

CLINICAL DATA: Shortness of breath and epigastric pain for greater
than 12 hours.

PORTABLE CHEST - 1 VIEW

[AP]
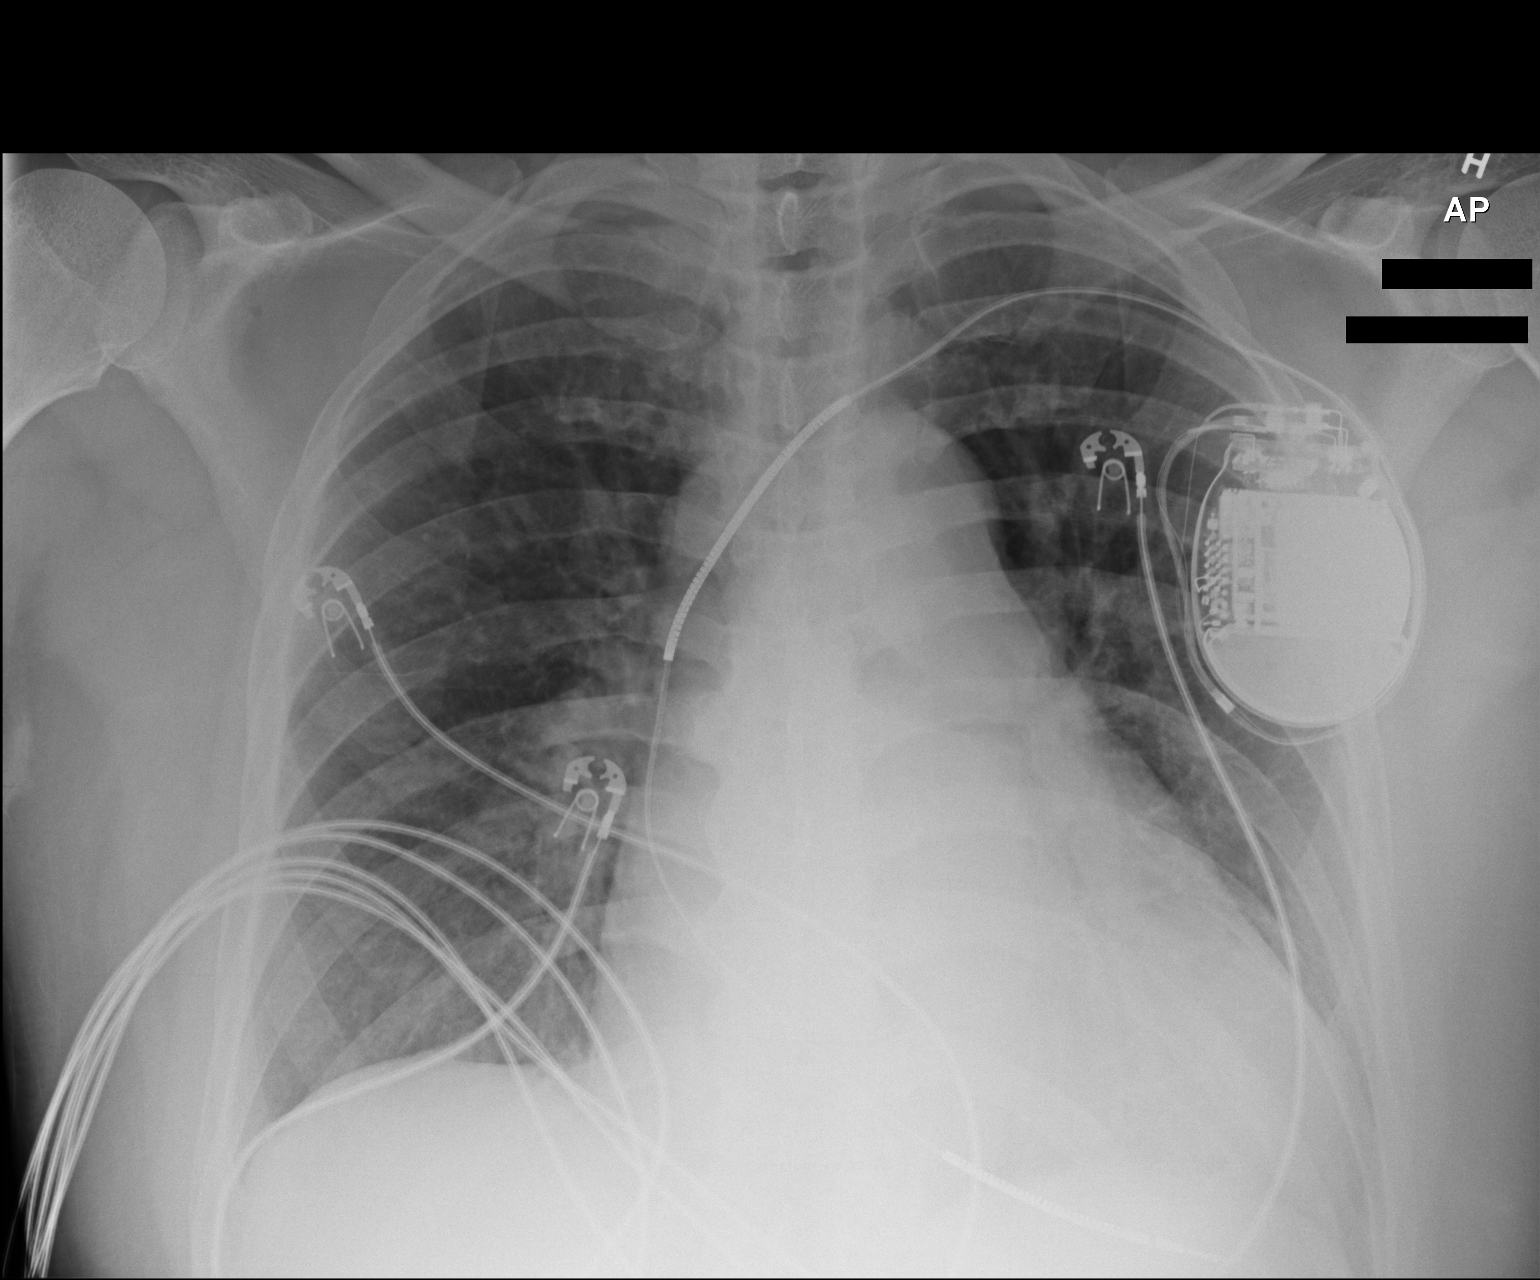

[1 of 1 positions shown; findings below may reference images not displayed]

FINDINGS: Cardiac enlargement.  Left subclavian pacemaker, with
single lead.   Mild vascular congestion.  No infiltrates or
failure.  No effusion or pneumothorax.  Prior cervical fusion.
Similar appearance to priors.
IMPRESSION: Cardiomegaly.  Mild vascular congestion.  Stable exam.

## 2013-06-23 ENCOUNTER — Encounter: Payer: Self-pay | Admitting: Family

## 2013-06-23 ENCOUNTER — Ambulatory Visit (INDEPENDENT_AMBULATORY_CARE_PROVIDER_SITE_OTHER): Payer: PRIVATE HEALTH INSURANCE | Admitting: Family

## 2013-06-23 VITALS — BP 120/90 | HR 86 | Temp 98.1°F | Ht 75.0 in | Wt 258.0 lb

## 2013-06-23 DIAGNOSIS — Z0000 Encounter for general adult medical examination without abnormal findings: Secondary | ICD-10-CM

## 2013-06-23 DIAGNOSIS — E669 Obesity, unspecified: Secondary | ICD-10-CM

## 2013-06-23 DIAGNOSIS — Z125 Encounter for screening for malignant neoplasm of prostate: Secondary | ICD-10-CM

## 2013-06-23 DIAGNOSIS — M25511 Pain in right shoulder: Secondary | ICD-10-CM

## 2013-06-23 DIAGNOSIS — R739 Hyperglycemia, unspecified: Secondary | ICD-10-CM

## 2013-06-23 DIAGNOSIS — M25519 Pain in unspecified shoulder: Secondary | ICD-10-CM

## 2013-06-23 LAB — LIPID PANEL
Cholesterol: 171 mg/dL (ref 0–200)
HDL: 63 mg/dL (ref >39)
LDL Cholesterol: 70 mg/dL (ref 0–99)
Total CHOL/HDL Ratio: 2.7 Ratio
Triglycerides: 188 mg/dL — ABNORMAL HIGH (ref <150)
VLDL: 38 mg/dL (ref 0–40)

## 2013-06-23 LAB — HEMOGLOBIN A1C
HEMOGLOBIN A1C: 6.5 % — AB (ref <5.7)
MEAN PLASMA GLUCOSE: 140 mg/dL — AB (ref <117)

## 2013-06-23 MED ORDER — ZOLPIDEM TARTRATE 5 MG PO TABS: 5.0000 mg | ORAL_TABLET | Freq: Every evening | ORAL | Status: DC | PRN

## 2013-06-23 MED ORDER — AMOXICILLIN 500 MG PO CAPS: 500.0000 mg | ORAL_CAPSULE | Freq: Three times a day (TID) | ORAL | Status: DC

## 2013-06-23 MED ORDER — SERTRALINE HCL 50 MG PO TABS: 50.0000 mg | ORAL_TABLET | Freq: Every day | ORAL | Status: DC

## 2013-06-23 NOTE — Progress Notes (Signed)
Subjective:    Patient ID: [name], male    DOB: 1950/09/09, 64 y.o.   MRN: [id number]  HPI  Subjective:   Patient here for Medicare annual wellness visit and management of other chronic and acute problems.  CKD-  Last visit he was referred to nephrology.  Depression/anxiety- maintained on zoloft. Reports that the ran out 4-5 days.  Feels more depressed since he stopped the medication.    Systolic CHF- he is followed by Dr. Haroldine Laws.  Reports that he converted into sinus rhythm.    Chronic pain syndrome- last visit he was started on oxycodone and percocet was discontinued. He was referred to pain management.  He has since been seen by the pain clinic. Saw Amy Thomas.    R shoulder pain- reports that 3 weeks ago he was mopping the floor at the church, then developed pain swelling, right shoulder.  >7 days cough/dark phlegm.     Risk factors:   Roster of Physicians Providing Medical Care to Patient: Dr. Haroldine Laws Dr. Judeth Horn Dr. Terrence Dupont Dr. Vanita Panda   Activities of Daily Living  In your present state of health, do you have any difficulty performing the following activities? Preparing food and eating?: No  Bathing yourself: No  Getting dressed:  Gets winded easily with dressing Using the toilet:No  Moving around from place to place: No  In the past year have you fallen or had a near fall?:No    Home Safety: Has smoke detector and wears seat belts. No firearms. No excess sun exposure.  Diet and Exercise  Current exercise habits: unable Dietary issues discussed: healthy diet   Depression Screen  (Note: if answer to either of the following is "Yes", then a more complete depression screening is indicated)  Q1: Over the past two weeks, have you felt down, depressed or hopeless?no  Q2: Over the past two weeks, have you felt little interest or pleasure in doing things? no   The following portions of the patient's history were reviewed and updated as  appropriate: allergies, current medications, past family history, past medical history, past social history, past surgical history and problem list.    Objective:   Vision: 3 years ago at [place] Hearing: Able to hear forced whisper at 6 feet Body mass index: Body mass index is 32.25 kg/(m^2).  Cognitive Impairment Assessment: cognition, memory and judgment appear normal.   Physical Exam  Constitutional: He is oriented to person, place, and time. He appears well-developed and well-nourished. No distress.  HENT:  Head: Normocephalic and atraumatic.  Right Ear: Tympanic membrane and ear canal normal.  Left Ear: Tympanic membrane and ear canal normal.  Mouth/Throat: Oropharynx is clear and moist.  Eyes: Pupils are equal, round, and reactive to light. No scleral icterus.  Neck: Normal range of motion. No thyromegaly present.  Cardiovascular: Normal rate and regular rhythm.   No murmur heard. Pulmonary/Chest: Effort normal and breath sounds normal. No respiratory distress. He has no wheezes. He has no rales. He exhibits no tenderness.  Abdominal: Soft. Bowel sounds are normal. He exhibits no distension and no mass. There is no tenderness. There is no rebound and no guarding.  Musculoskeletal: He exhibits 1+ bilateral LE  edema. + tenderness right shoulder with decrease active/passive ROM Lymphadenopathy:    He has no cervical adenopathy.  Neurological: He is alert and oriented to person, place, and time. He has normal reflexes. He exhibits normal muscle tone. Coordination normal.  Skin: Skin is warm and dry.  Psychiatric: He has a normal mood and affect. His behavior is normal. Judgment and thought content normal.        Assessment & Plan:     Assessment:   Medicare wellness utd on preventive parameters  Plan:    During the course of the visit the patient was educated and counseled about appropriate screening and preventive services including:  Colon cancer screening- declines  colonoscopy, agreeable to IFOB. Discussed pros/cons of PSA and he is agreeable to proceed.  Vaccines / LABS  Discussed zostavax with pt. Advised him to check with insurance re: coverage and call us back to arrange nurse visit.   Patient Instructions (the written plan) was given to the patient.       Review of Systems     Objective:   Physical Exam        Assessment & Plan:

## 2013-06-23 NOTE — Progress Notes (Signed)
Pre visit review using our clinic review tool, if applicable. No additional management support is needed unless otherwise documented below in the visit note. 

## 2013-06-23 NOTE — Assessment & Plan Note (Signed)
Will refer to sports med for further evaluation.  Not candidate for MRI due to defibrillator, not good surgical candidate. Would avoid NSAIDS due to renal insufficiency.  He is followed by pain management.

## 2013-06-23 NOTE — Telephone Encounter (Signed)
Refill was provided to pt today at appointment.

## 2013-06-23 NOTE — Patient Instructions (Addendum)
Restart Zoloft. Complete lab work prior to leaving. You will be contacted about your referral to sports medicine. Complete stool kit and mail back at your earliest convenience.   Follow up in 3 months.

## 2013-06-24 ENCOUNTER — Other Ambulatory Visit: Payer: Self-pay | Admitting: Family

## 2013-06-24 ENCOUNTER — Telehealth: Payer: Self-pay | Admitting: Family

## 2013-06-24 ENCOUNTER — Encounter: Payer: Self-pay | Admitting: Family

## 2013-06-24 ENCOUNTER — Other Ambulatory Visit (HOSPITAL_COMMUNITY): Payer: Self-pay | Admitting: *Deleted

## 2013-06-24 DIAGNOSIS — R972 Elevated prostate specific antigen [PSA]: Secondary | ICD-10-CM

## 2013-06-24 HISTORY — DX: Elevated prostate specific antigen (PSA): R97.20

## 2013-06-24 LAB — PSA, MEDICARE: PSA: 7.27 ng/mL — AB (ref <=4.00)

## 2013-06-24 NOTE — Telephone Encounter (Signed)
Please contact pt and advise him that his PSA is elevated.  (prostate test).  I will refer him to urology for further evaluation.

## 2013-06-24 NOTE — Telephone Encounter (Signed)
LMOVM for pt to return call concerning results.

## 2013-06-25 ENCOUNTER — Encounter: Payer: Self-pay | Admitting: Internal Medicine

## 2013-06-26 ENCOUNTER — Ambulatory Visit (INDEPENDENT_AMBULATORY_CARE_PROVIDER_SITE_OTHER): Payer: PRIVATE HEALTH INSURANCE | Admitting: Family Medicine

## 2013-06-26 ENCOUNTER — Ambulatory Visit: Admitting: Family Medicine

## 2013-06-26 ENCOUNTER — Encounter: Payer: Self-pay | Admitting: Family Medicine

## 2013-06-26 VITALS — BP 108/78 | HR 71 | Ht 75.0 in | Wt 248.0 lb

## 2013-06-26 DIAGNOSIS — S4980XA Other specified injuries of shoulder and upper arm, unspecified arm, initial encounter: Secondary | ICD-10-CM

## 2013-06-26 DIAGNOSIS — S46909A Unspecified injury of unspecified muscle, fascia and tendon at shoulder and upper arm level, unspecified arm, initial encounter: Secondary | ICD-10-CM

## 2013-06-26 DIAGNOSIS — M67919 Unspecified disorder of synovium and tendon, unspecified shoulder: Secondary | ICD-10-CM

## 2013-06-26 DIAGNOSIS — M719 Bursopathy, unspecified: Secondary | ICD-10-CM

## 2013-06-26 DIAGNOSIS — M7541 Impingement syndrome of right shoulder: Secondary | ICD-10-CM

## 2013-06-26 DIAGNOSIS — S4991XA Unspecified injury of right shoulder and upper arm, initial encounter: Secondary | ICD-10-CM

## 2013-06-26 MED ORDER — METHYLPREDNISOLONE ACETATE 40 MG/ML IJ SUSP: 40.0000 mg | Freq: Once | INTRAMUSCULAR | Status: DC

## 2013-06-26 NOTE — Telephone Encounter (Signed)
Patient came in and I read him his lab results and he expressed understanding.  He wants the referral to urology

## 2013-06-26 NOTE — Patient Instructions (Signed)
You have strained your rotator cuff in addition to the muscles of your neck (trapezius) and chest (pectorals). Try to avoid painful activities (overhead activities, lifting with extended arm) as much as possible. Aleve 2 tabs twice a day with food OR ibuprofen 3 tabs three times a day with food for pain and inflammation. Take you pain medicine as you usually do. Subacromial injection may be beneficial to help with pain and to decrease inflammation - we did this today. Start physical therapy with transition to home exercise program. Do home exercise program with theraband and scapular stabilization exercises daily - these are very important for long term relief even if an injection was given. Follow up with me in 5-6 weeks.

## 2013-06-28 IMAGING — CR DG CHEST 2V
2 series · 2 of 2 positions shown · non-contrast
Comparison: 01/09/2012

CLINICAL DATA: Chest pain

CHEST - 2 VIEW

[w chest pa]
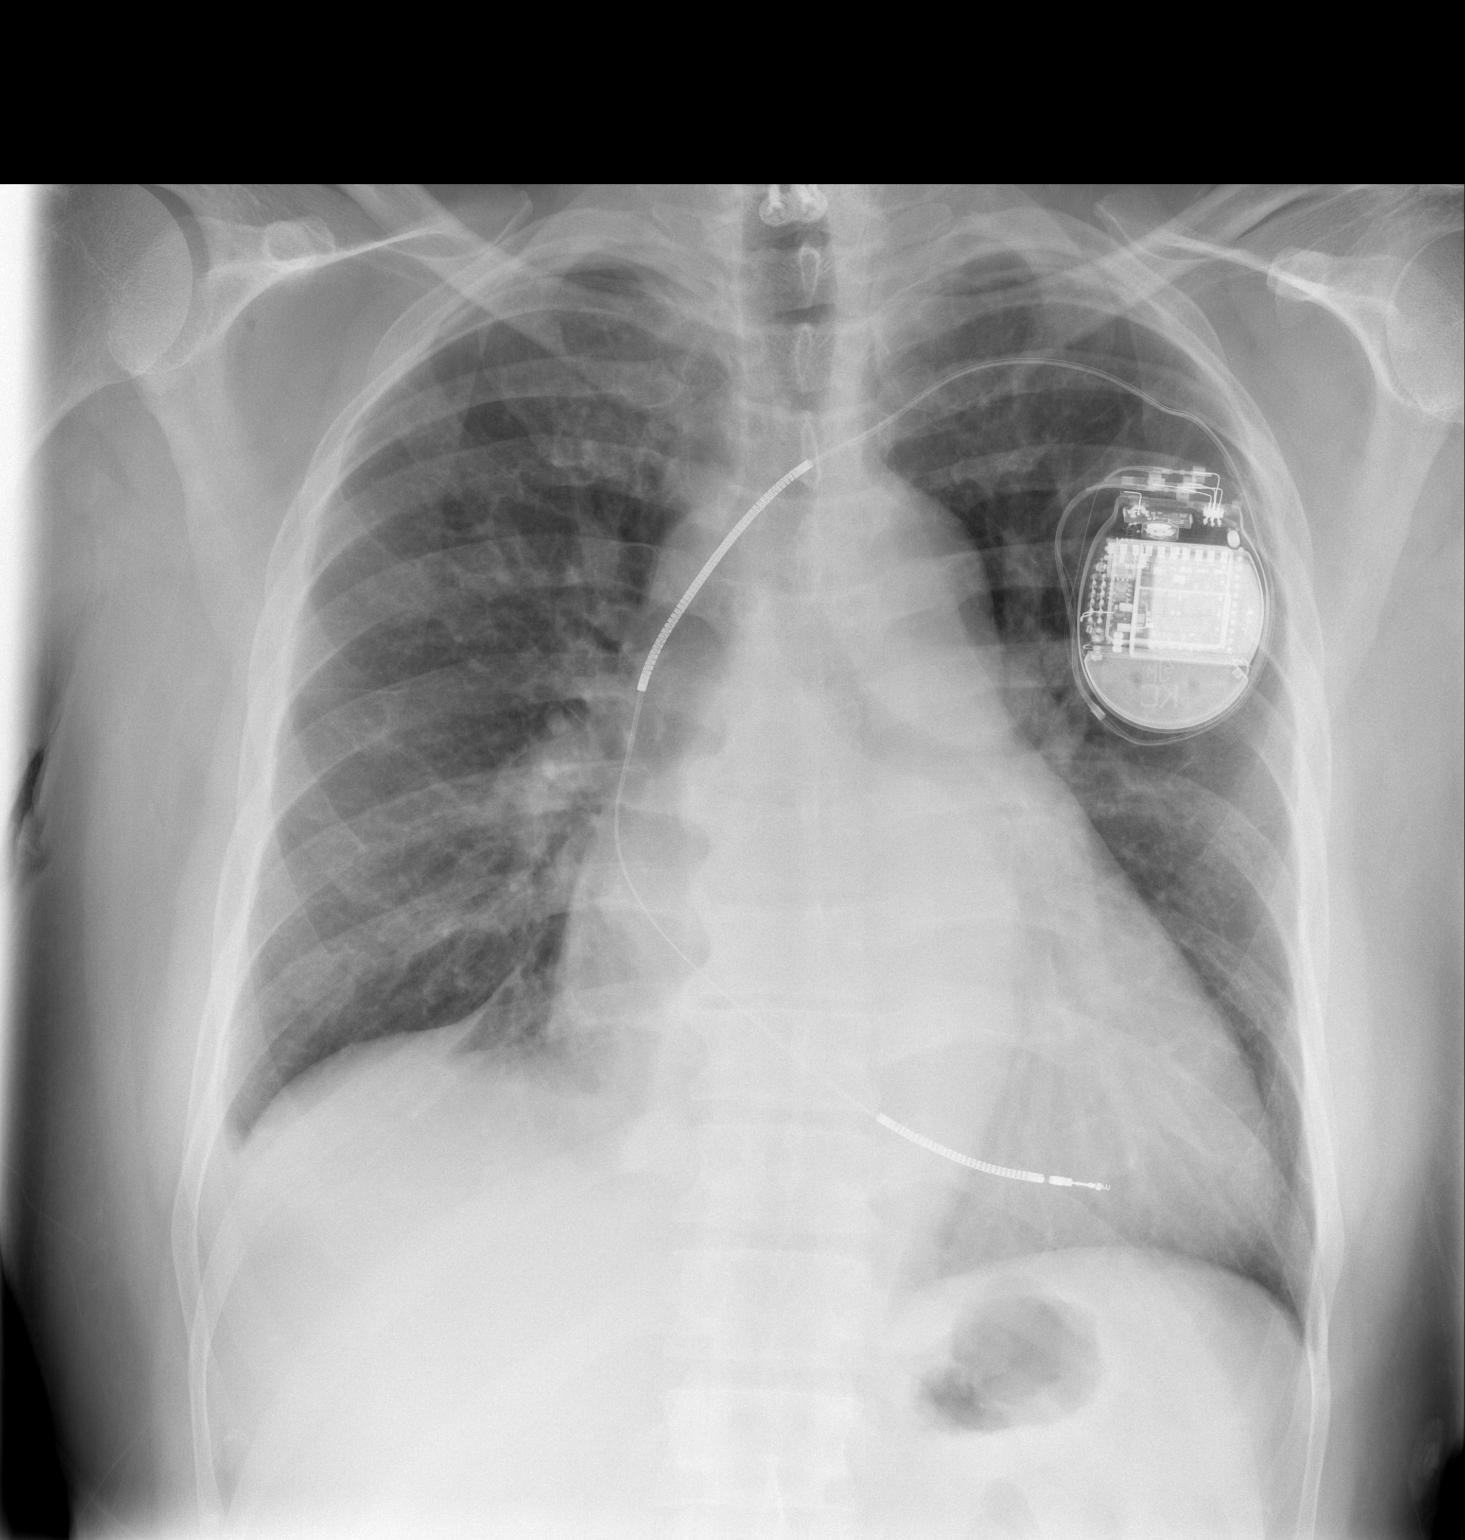

[w chest lat]
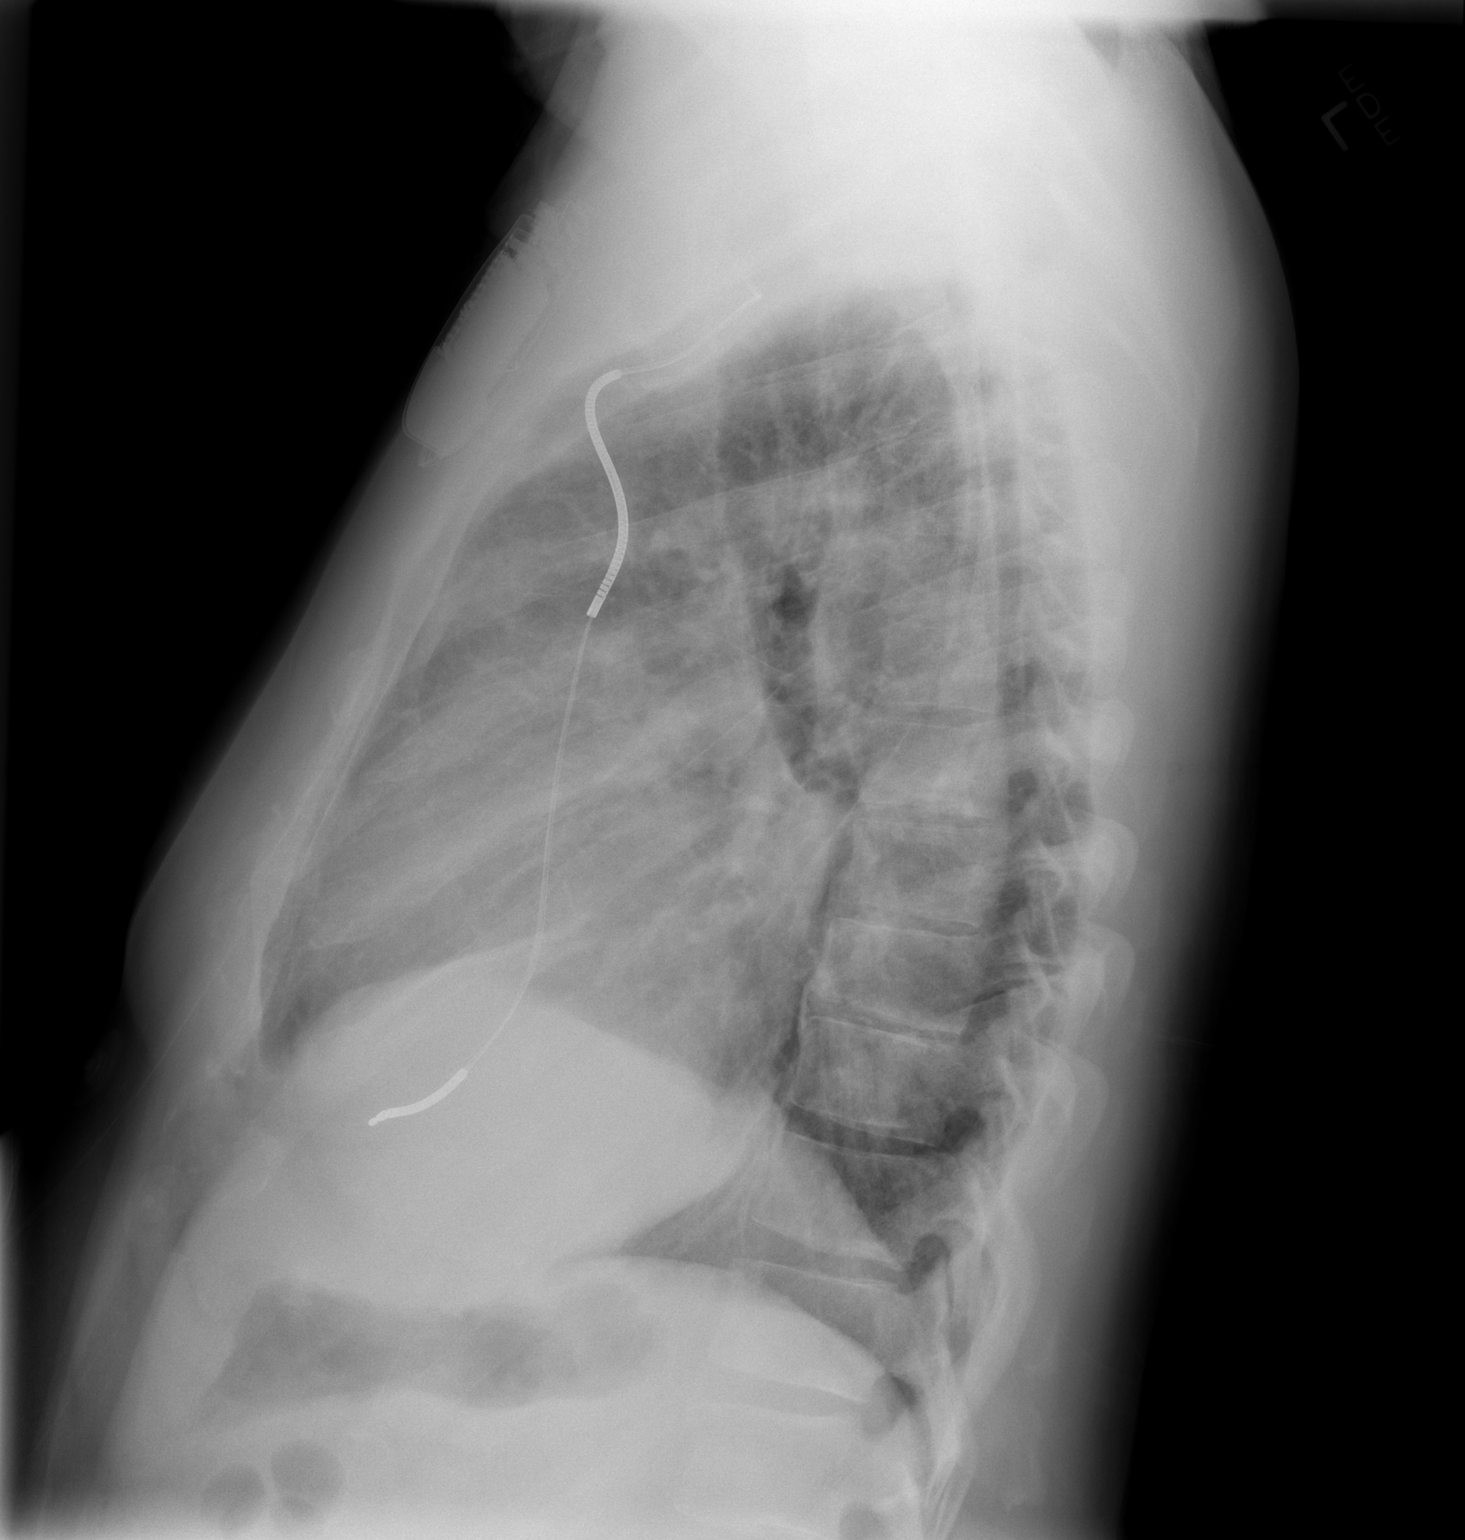

[2 of 2 positions shown; findings below may reference images not displayed]

FINDINGS: Moderate cardiomegaly.  Left subclavian AICD device and
leads are stable.  No new consolidation or mass.  No pneumothorax.
No pleural effusion.  Normal vascularity.
IMPRESSION: Cardiomegaly without decompensation.  Stable left subclavian AICD
device.

## 2013-07-01 ENCOUNTER — Telehealth (HOSPITAL_COMMUNITY): Payer: Self-pay

## 2013-07-01 IMAGING — CR DG ABDOMEN ACUTE W/ 1V CHEST
4 series · 4 of 4 positions shown · non-contrast
Comparison: Chest 01/15/2012.  CT abdomen and pelvis 01/09/2012.

CLINICAL DATA: Abdominal pain and constipation for 2 weeks.  Chest
pain.

ACUTE ABDOMEN SERIES (ABDOMEN 2 VIEW & CHEST 1 VIEW)

[w chest pa]
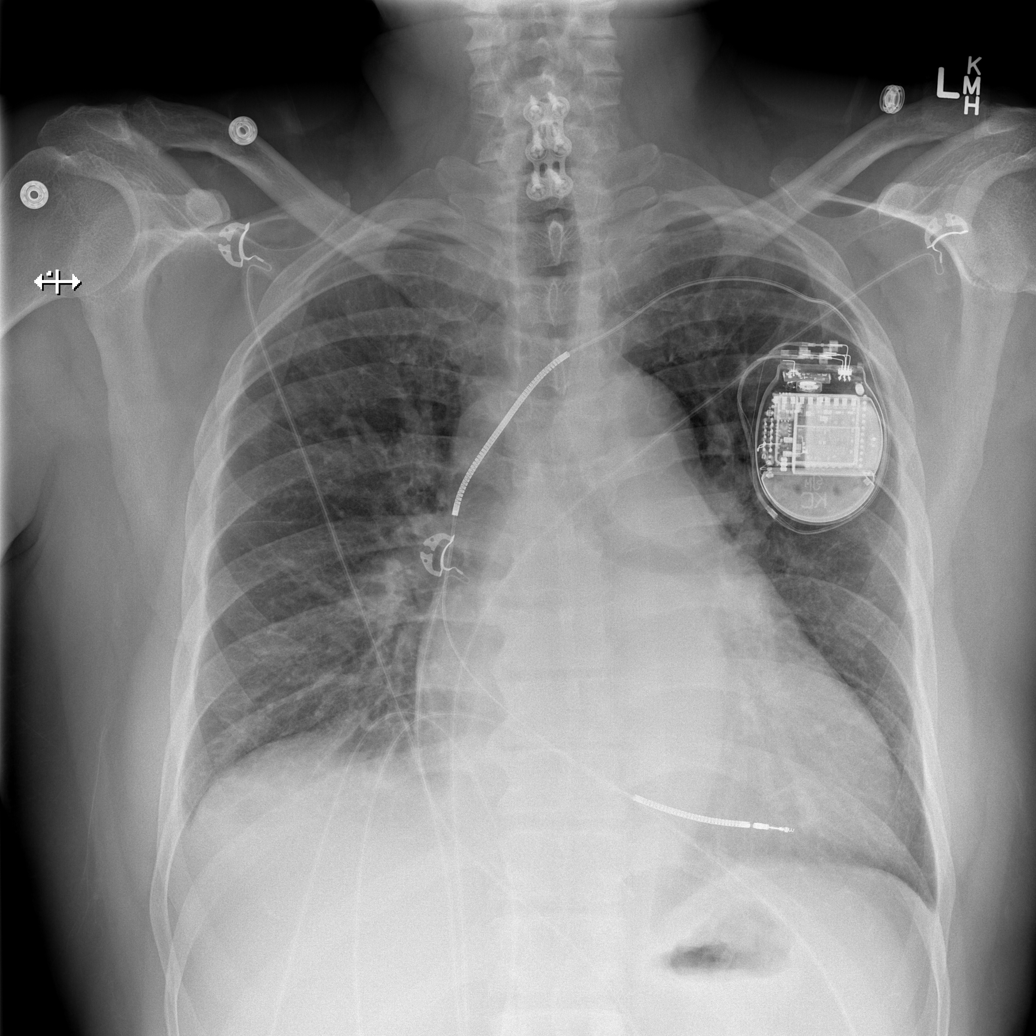

[w abdomen upright]
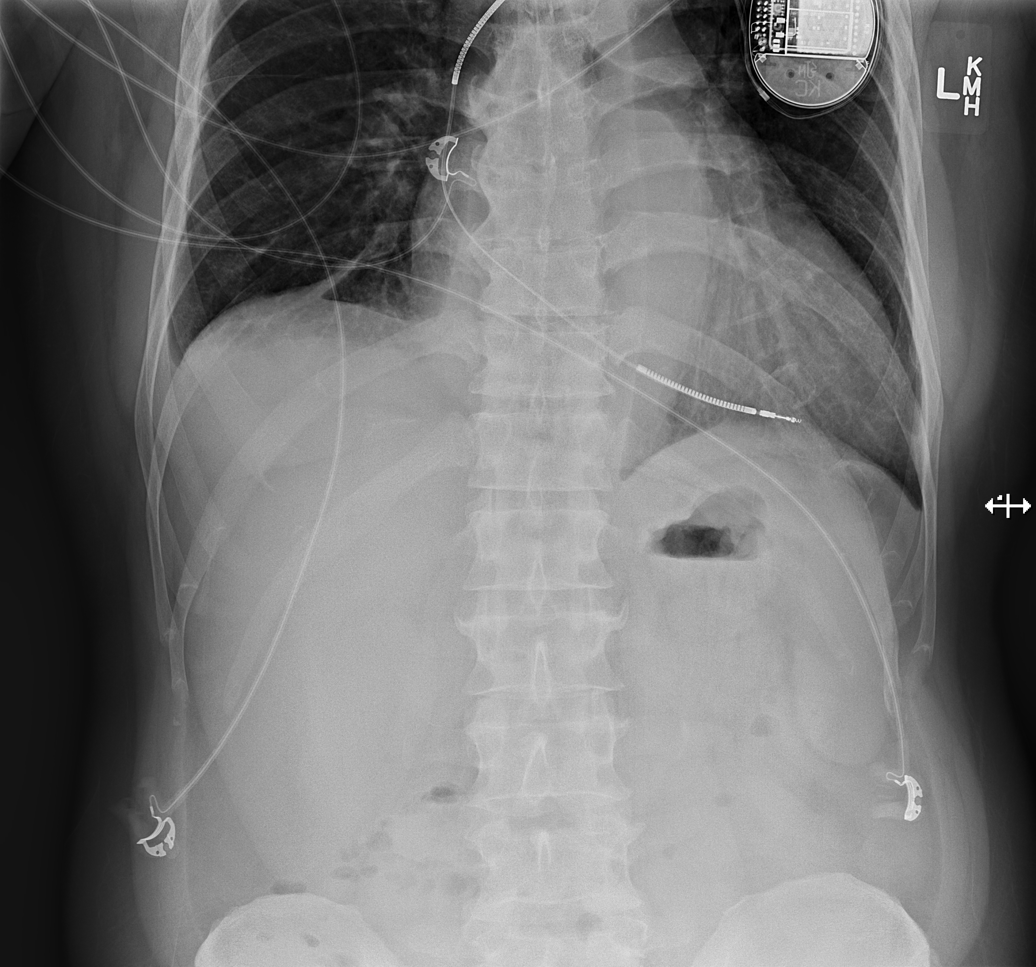

[t abdomen supine (1 of 2)]
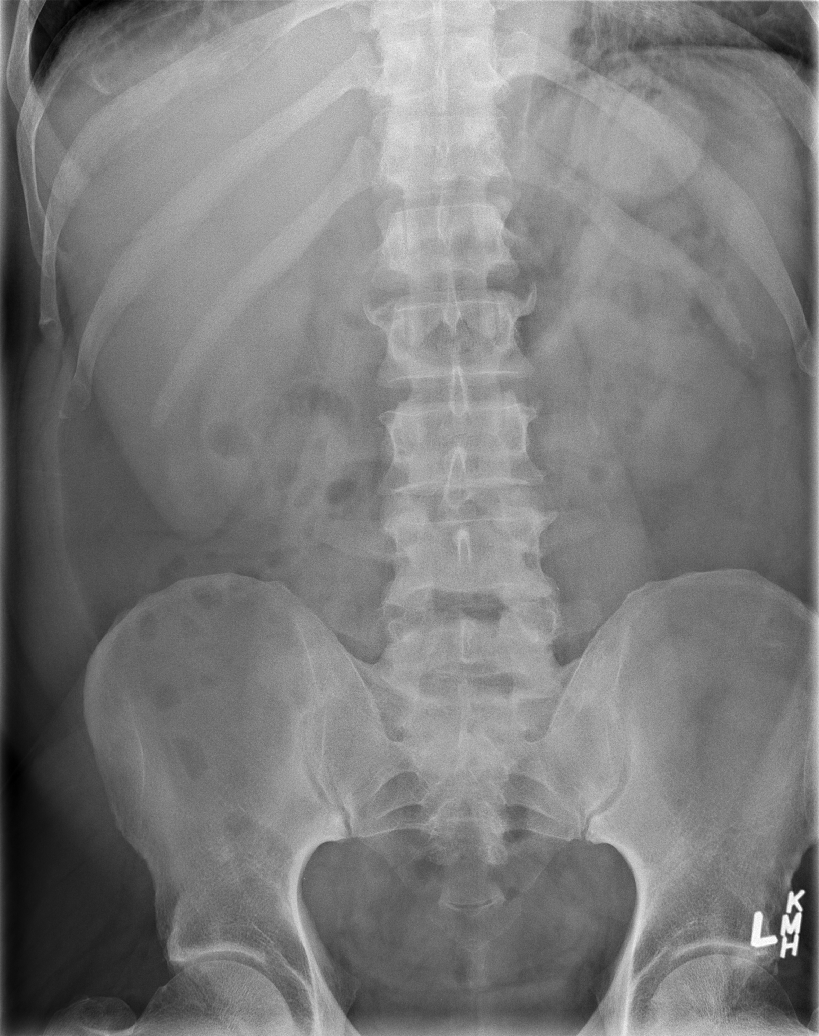

[t abdomen supine (2 of 2)]
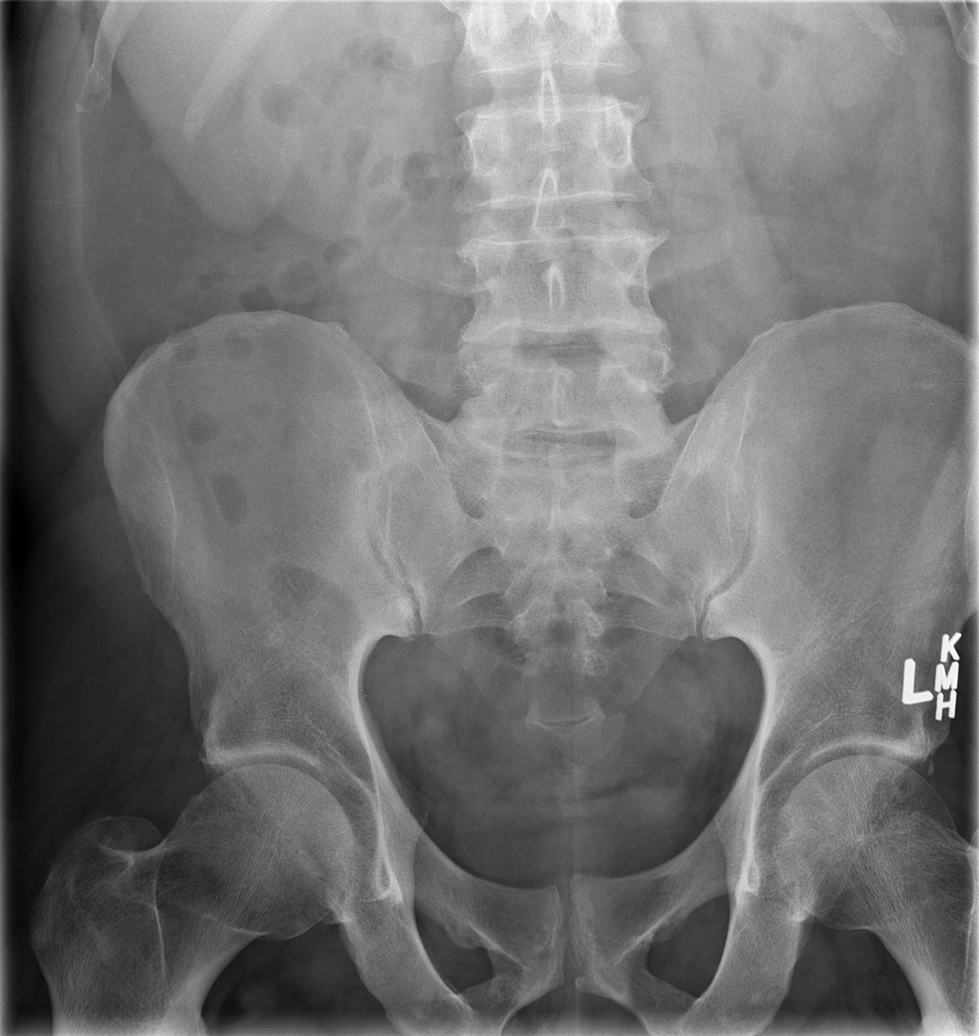

[4 of 4 positions shown; findings below may reference images not displayed]

FINDINGS: Stable appearance of cardiac pacemaker.  Cardiac
enlargement with mild pulmonary vascular congestion vascular
congestion is developing since previous study.  No definite edema.
No blunting of costophrenic angles.  No pneumothorax.  No focal
consolidation.

Minimal scattered gas and stool in the colon.  No free intra-
abdominal air.  No abnormal air fluid levels.  No radiopaque
stones.  Mild degenerative changes in the spine.
IMPRESSION: Cardiac enlargement with developing pulmonary vascular congestion.
Nonobstructive bowel gas pattern.

## 2013-07-01 MED ORDER — POTASSIUM CHLORIDE CRYS ER 20 MEQ PO TBCR: EXTENDED_RELEASE_TABLET | ORAL | Status: DC

## 2013-07-01 MED ORDER — EPLERENONE 25 MG PO TABS: 25.0000 mg | ORAL_TABLET | Freq: Every day | ORAL | Status: DC

## 2013-07-01 NOTE — Telephone Encounter (Signed)
Called patient and he is taking Spironolactone 12.5 mg daily and Inspra 12.5 mg dialy. Will stop spironolactone and increase Inspra to 25 mg daily. Reviewed labs and K+ 2.9 and Creatinine 3.26. Will also increase home potassium to 60 meq q am and 40 meq q pm. He understands and reports he will increase his potassium and Inspra today.

## 2013-07-02 ENCOUNTER — Encounter: Payer: Self-pay | Admitting: Family Medicine

## 2013-07-02 ENCOUNTER — Encounter: Payer: Self-pay | Admitting: Family

## 2013-07-02 DIAGNOSIS — S4991XA Unspecified injury of right shoulder and upper arm, initial encounter: Secondary | ICD-10-CM | POA: Insufficient documentation

## 2013-07-02 IMAGING — CT CT ABD-PELV W/O CM
1 of 3 series · 15 of 32 positions shown, 19 images · non-contrast
Comparison: [HOSPITAL] CT abdomen and pelvis 01/09/2012.

CLINICAL DATA: Constipation and abdominal distension for 2 weeks.
Pain, nausea, and vomiting.  Tarry stools.

CT ABDOMEN AND PELVIS WITHOUT CONTRAST
TECHNIQUE: Multidetector CT imaging of the abdomen and pelvis was
performed following the standard protocol without intravenous
contrast.

[Series 2: abd/pel w/o · axial · non-contrast · 0.74mm/px · z∈[+1312,+1698]mm · 15 of 85 slices shown, 19 images]
[im 4/85  soft-tissue]
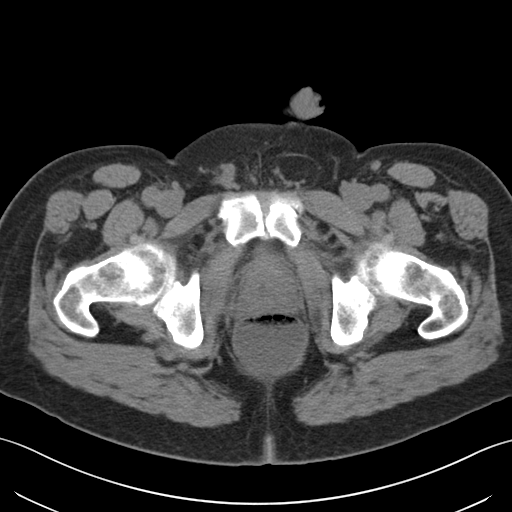
[im 4/85  bone]
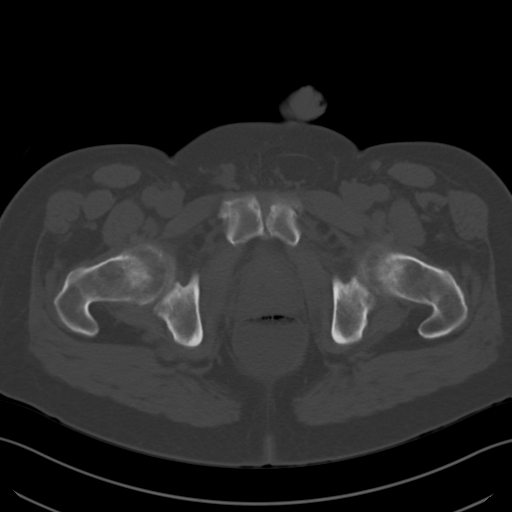
[im 11/85  soft-tissue]
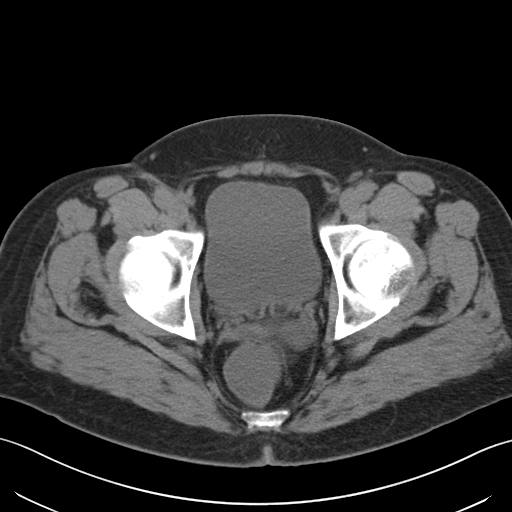
[im 19/85  soft-tissue]
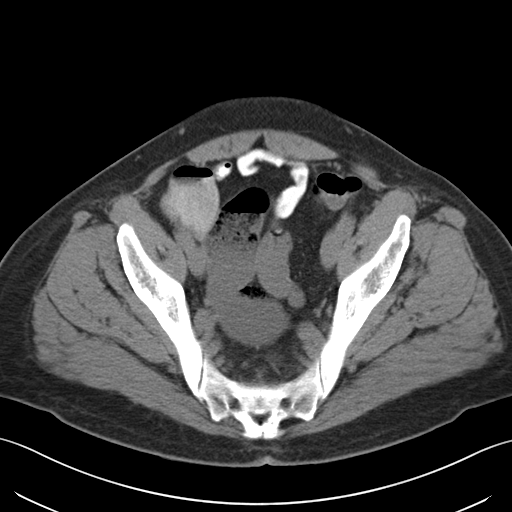
[im 22/85  soft-tissue]
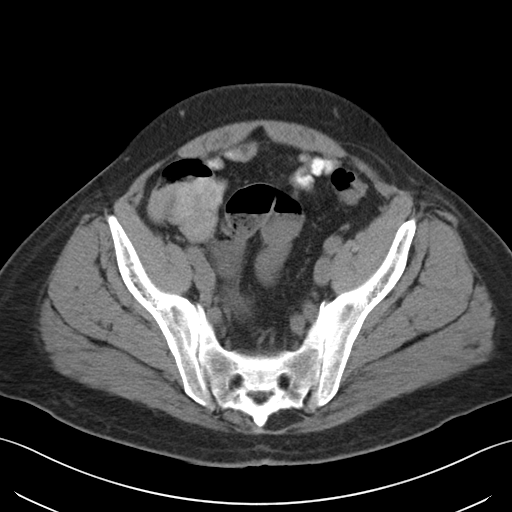
[im 30/85  soft-tissue]
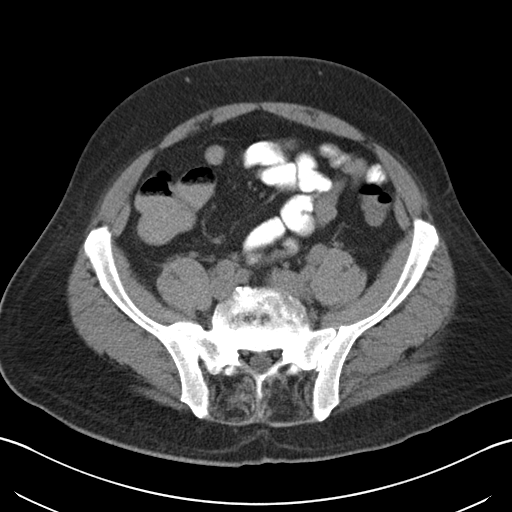
[im 37/85  soft-tissue]
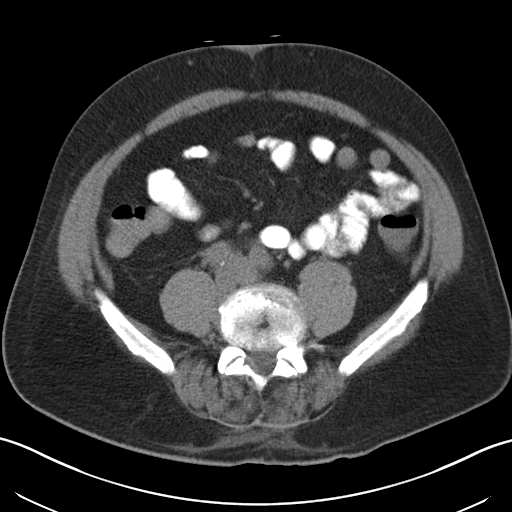
[im 44/85  soft-tissue]
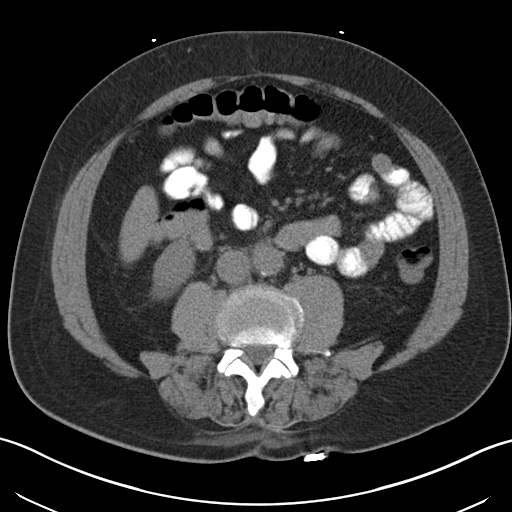
[im 48/85  soft-tissue]
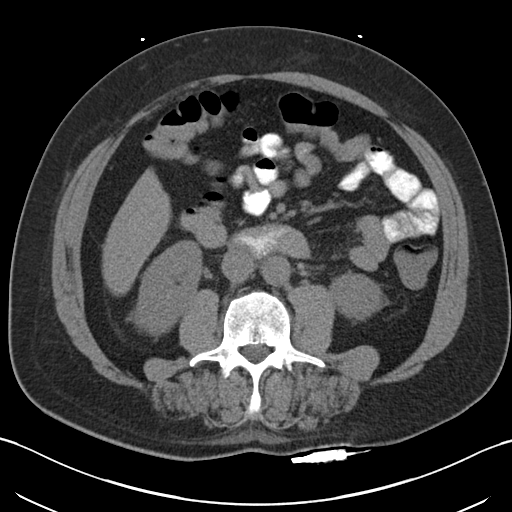
[im 55/85  soft-tissue]
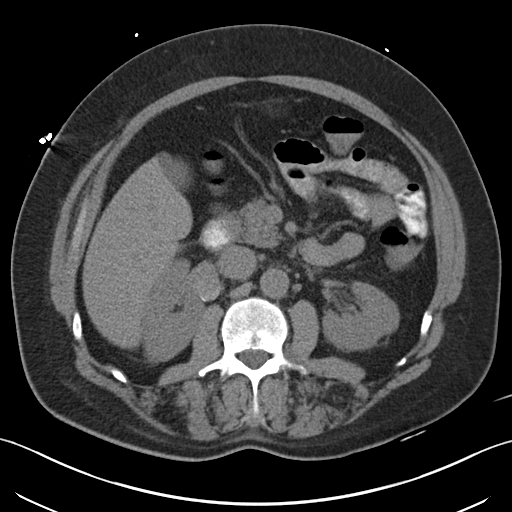
[im 55/85  bone]
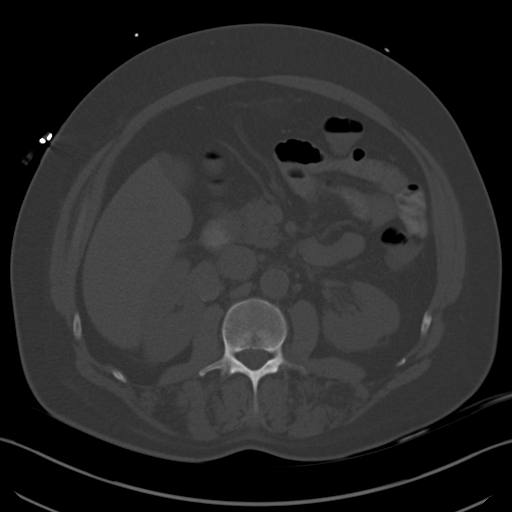
[im 63/85  soft-tissue]
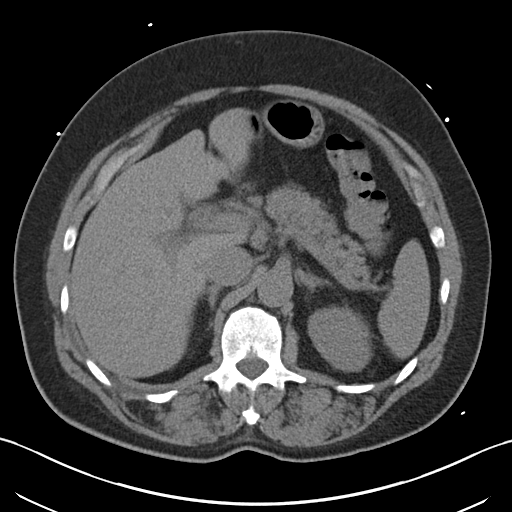
[im 66/85  soft-tissue]
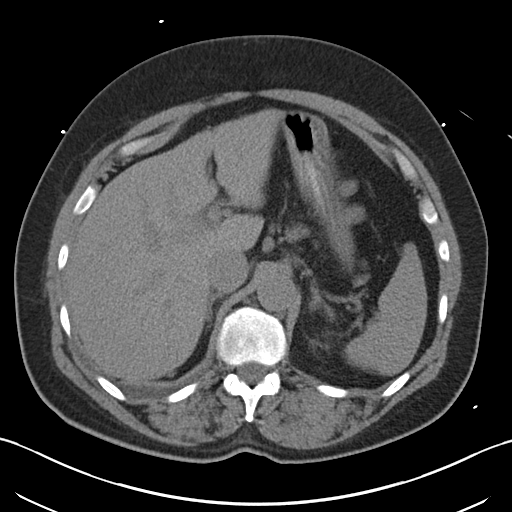
[im 70/85  lung]
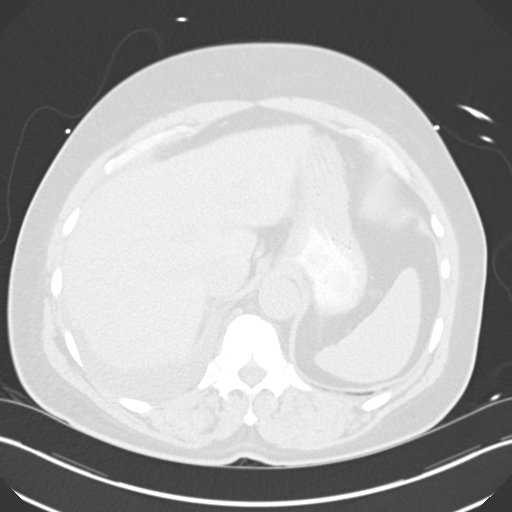
[im 74/85  soft-tissue]
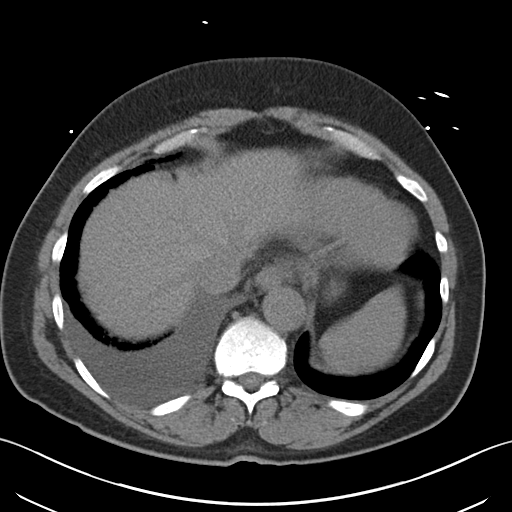
[im 74/85  lung]
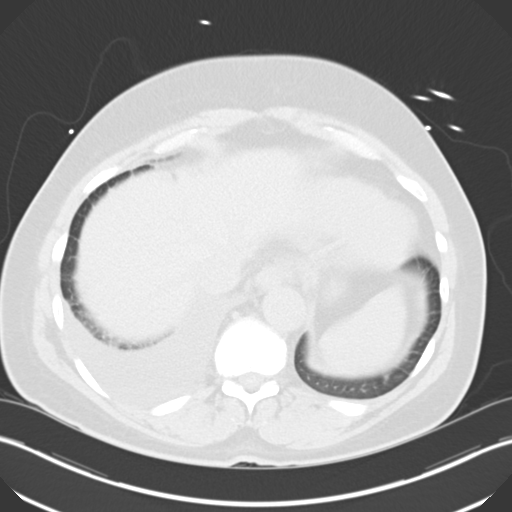
[im 77/85  lung]
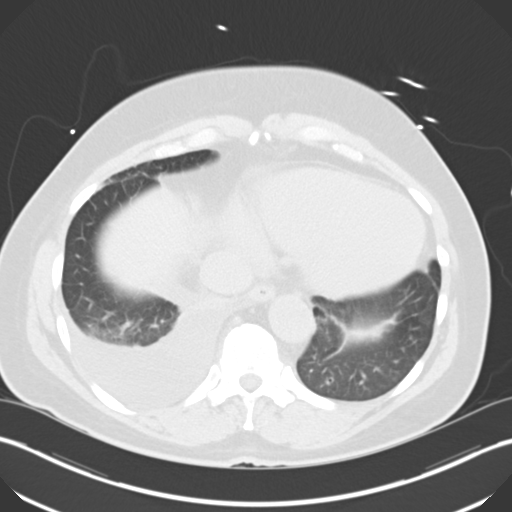
[im 81/85  soft-tissue]
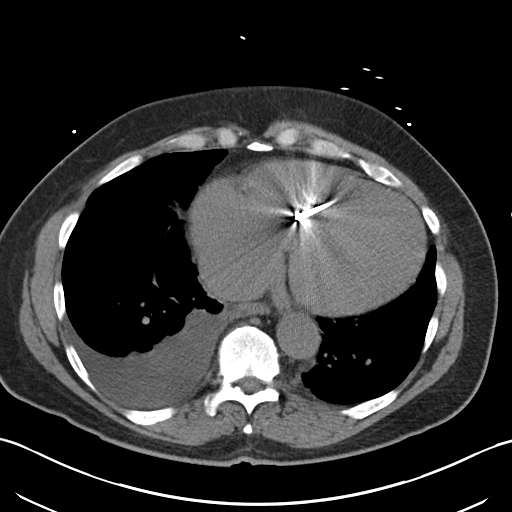
[im 81/85  lung]
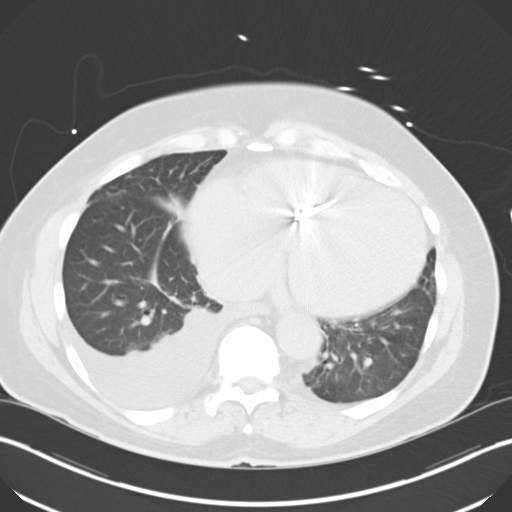

[15 of 32 positions shown; findings below may reference images not displayed]

FINDINGS: There is a small moderate sized right pleural effusion
with mild basilar atelectasis.  Diffuse cardiac enlargement.

The unenhanced appearance of the liver, spleen, gallbladder,
pancreas, adrenal glands, abdominal aorta, and retroperitoneal
lymph nodes is unremarkable.  There is a hyperdense rounded
structure adjacent to the mid pole of the right kidney centrally,
measuring 2.1 cm diameter.  This may represent a hemorrhagic cyst
or perhaps renal artery aneurysm.  This is stable since the
previous study.  Again, I elected MRI evaluation should be
considered.  Hydronephrosis in either kidney.  The stomach, small
bowel, and colon are not abnormally distended.  No free air or free
fluid in the abdomen.  Contrast material flows to the cecum without
evidence of bowel obstruction.  Stool and liquid stool filled
colon.

Pelvis:  Prostate gland is not enlarged.  Bladder wall is not
thickened.  No free or loculated pelvic fluid collections.  No
significant pelvic lymphadenopathy.  No evidence of diverticulitis.
The appendix is normal.  Degenerative changes in the lumbar spine.
No destructive bone lesions appreciated.  No significant changes
since the previous study.
IMPRESSION: No acute process demonstrated in the abdomen or pelvis.  Stable
appearance since previous study.  Right pleural effusion with
basilar atelectasis.  Hyperdense rounded structure in the right
renal hilum could represent hemorrhagic cyst or renal artery
aneurysm.  Consider further evaluation with elective MRI.

## 2013-07-02 NOTE — Assessment & Plan Note (Signed)
overuse rotator cuff strain.  Unlikely torn given he did not have an acute injury (partial tear from fraying is possible but motion better than would expect for full thickness tear).  Start with conservative treatment - physical therapy, nsaids (advised to discuss with other physicians -heart, kidney - if he can take these).  Subacromial injection given today.  F/u in 5-6 weeks.  After informed written consent, patient was seated on exam table. Right shoulder was prepped with alcohol swab and utilizing posterior approach, patient's right subacromial space was injected with 3:1 marcaine: depomedrol. Patient tolerated the procedure well without immediate complications.

## 2013-07-02 NOTE — Progress Notes (Signed)
Patient ID: [name], male   DOB: [birth year], 63 y.o.   MRN: [id number]  PCP: Nance Pear., NP Referred by: Debbrah Alar NP  Subjective:   HPI: Patient is a 63 y.o. male here for right shoulder injury.  Patient reports about 2 weeks ago he was helping clean up at his church. Was mopping with a big industrial mop. No acute injury but by next day shoulder was painful and has worsened since. No night pain. Takes pain medicine regularly for chronic pain. Some radiation into neck and right chest. No numbness/tingling.  Past Medical History  Diagnosis Date  . CHF (congestive heart failure)   . Sarcoidosis   . Cardiomyopathy, dilated, nonischemic     non ischemic by cath  . Acute on chronic systolic heart failure   . Automatic implantable cardiac defibrillator in situ   . Atrial fibrillation   . NSVT (nonsustained ventricular tachycardia)   . GERD (gastroesophageal reflux disease)   . Hypercholesteremia   . Myocardial infarction   . Shortness of breath   . Chronic kidney disease (CKD), stage III (moderate)   . Pacemaker   . Anginal pain   . Gout   . Hypertension     dr [name]  . Coronary artery disease   . Elevated PSA 06/24/2013    Current Outpatient Prescriptions on File Prior to Visit  Medication Sig Dispense Refill  . allopurinol (ZYLOPRIM) 100 MG tablet Take 1 tablet (100 mg total) by mouth daily.  30 tablet  6  . ALPRAZolam (XANAX) 0.5 MG tablet Take 0.5 mg by mouth 3 (three) times daily as needed for anxiety.      Marland Kitchen amiodarone (PACERONE) 200 MG tablet Take 200 mg by mouth 2 (two) times daily.      Marland Kitchen amoxicillin (AMOXIL) 500 MG capsule Take 1 capsule (500 mg total) by mouth 3 (three) times daily.  30 capsule  0  . apixaban (ELIQUIS) 5 MG TABS tablet Take 1 tablet (5 mg total) by mouth 2 (two) times daily.  60 tablet  4  . carvedilol (COREG) 3.125 MG tablet Take 3.125 mg by mouth 2 (two) times daily with a meal.      . esomeprazole (NEXIUM) 40  MG capsule Take 1 capsule (40 mg total) by mouth daily.  30 capsule  3  . fluticasone (FLONASE) 50 MCG/ACT nasal spray Place 2 sprays into both nostrils daily as needed for allergies.       . Gabapentin, PHN, (GRALISE STARTER) 300 & 600 MG MISC Take by mouth.      . hydrOXYzine (ATARAX/VISTARIL) 25 MG tablet Take 25 mg by mouth 3 (three) times daily as needed for itching.      Marland Kitchen LORazepam (ATIVAN) 0.5 MG tablet Take 1-2 tablets (0.5-1 mg total) by mouth 2 (two) times daily as needed for anxiety.  50 tablet  0  . metolazone (ZAROXOLYN) 2.5 MG tablet Take 1 tablet (2.5 mg total) by mouth daily as needed (for fluid retention).  10 tablet  1  . milrinone (PRIMACOR) 20 MG/100ML SOLN infusion Inject 0.375 mcg/kg/min into the vein continuous. 440mg  in 433ml infusion 2.5 ml per hour      . Oxycodone HCl 20 MG TABS Take 1 tablet (20 mg total) by mouth 3 (three) times daily as needed.  90 tablet  0  . promethazine (PHENERGAN) 25 MG tablet Take 25 mg by mouth every 6 (six) hours as needed for nausea or vomiting.      . ranitidine (  ZANTAC) 150 MG capsule Take 150 mg by mouth 2 (two) times daily.      Marland Kitchen senna-docusate (SENOKOT-S) 8.6-50 MG per tablet Take 1-2 tablets by mouth 2 (two) times daily as needed for mild constipation or moderate constipation.      . sertraline (ZOLOFT) 50 MG tablet Take 1 tablet (50 mg total) by mouth daily.  30 tablet  5  . sildenafil (VIAGRA) 100 MG tablet Take 1 tablet (100 mg total) by mouth daily as needed for erectile dysfunction.  5 tablet  1  . torsemide (DEMADEX) 20 MG tablet Take 4 tablets (80 mg total) by mouth daily.  120 tablet  3  . zolpidem (AMBIEN) 5 MG tablet Take 1 tablet (5 mg total) by mouth at bedtime as needed for sleep.  30 tablet  0   No current facility-administered medications on file prior to visit.    Past Surgical History  Procedure Laterality Date  . Back surgery  1987    Ruptured disk repair  . Pacemaker insertion      with ICD  . Tee without  cardioversion  01/17/2011    Procedure: TRANSESOPHAGEAL ECHOCARDIOGRAM (TEE);  Surgeon: Birdie Riddle, MD;  Location: [place];  Service: Cardiovascular;  Laterality: N/A;  . Cardioversion  01/17/2011    Procedure: CARDIOVERSION;  Surgeon: Birdie Riddle, MD;  Location: [place];  Service: Cardiovascular;  Laterality: N/A;  . Cardiac catheterization    . Insert / replace / remove pacemaker    . Anterior cervical decomp/discectomy fusion  08/21/2011    Procedure: ANTERIOR CERVICAL DECOMPRESSION/DISCECTOMY FUSION 2 LEVELS;  Surgeon: Marybelle Killings, MD;  Location: [place];  Service: Orthopedics;  Laterality: N/A;  C5-6, C6-7 Anterior Cervical Discectomy and Fusion, allograft, plate  . Tee without cardioversion N/A 02/16/2012    Procedure: TRANSESOPHAGEAL ECHOCARDIOGRAM (TEE);  Surgeon: Jolaine Artist, MD;  Location: [facility] ENDOSCOPY;  Service: Cardiovascular;  Laterality: N/A;  original case scheduled under his dad (who is deceased), rescheduled under correct mrn/pt/dob. [place]/dl  . Tee without cardioversion N/A 03/22/2012    Procedure: TRANSESOPHAGEAL ECHOCARDIOGRAM (TEE);  Surgeon: Lelon Perla, MD;  Location: [place];  Service: Cardiovascular;  Laterality: N/A;  . Cardioversion N/A 03/22/2012    Procedure: CARDIOVERSION;  Surgeon: Lelon Perla, MD;  Location: [facility] ENDOSCOPY;  Service: Cardiovascular;  Laterality: N/A;  . Cardioversion N/A 04/26/2012    Procedure: CARDIOVERSION;  Surgeon: Jolaine Artist, MD;  Location: [facility] ENDOSCOPY;  Service: Cardiovascular;  Laterality: N/A;  . Central venous catheter tunneled insertion single lumen  09/16/2012    right IJ  . Cardioversion N/A 11/08/2012    Procedure: CARDIOVERSION;  Surgeon: Jolaine Artist, MD;  Location: [facility] ENDOSCOPY;  Service: Cardiovascular;  Laterality: N/A;    Allergies  Allergen Reactions  . Nitroglycerin Other (See Comments)    "feels like head is going to bust open"    History   Social History  . Marital Status:  Single    Spouse Name: N/A    Number of Children: N/A  . Years of Education: N/A   Occupational History  . Not on file.   Social History Main Topics  . Smoking status: Never Smoker   . Smokeless tobacco: Never Used  . Alcohol Use: No  . Drug Use: Yes    Special: "Crack" cocaine, Marijuana, Cocaine     Comment: last use Novemenber  . Sexual Activity: Not Currently   Other Topics Concern  . Not on file   Social  History Narrative   3 sons- one in [place], 1 in [place], on in [place]   Retired from post office- on disability   Completed Bachelors from [employer]    Family History  Problem Relation Age of Onset  . Heart disease    . Heart failure    . Stroke    . Anesthesia problems Neg Hx   . Hypotension Neg Hx   . Malignant hyperthermia Neg Hx   . Pseudochol deficiency Neg Hx     BP 108/78  Pulse 71  Ht 6\' 3"  (1.905 m)  Wt 248 lb (112.492 kg)  BMI 31.00 kg/m2  Review of Systems: See HPI above.    Objective:  Physical Exam:  Gen: NAD  Right shoulder: No swelling, ecchymoses.  No gross deformity. TTP right cervical paraspinal muscles, trapezius, superior pectoralis. ROM - full ER compared to left.  Abduction and flexion to 90 degrees. Positive Hawkins, Neers. Negative Speeds, Yergasons. Strength 5-/5 with empty can and 5/5 resisted internal/external rotation. Negative apprehension. NV intact distally.    Assessment & Plan:  1. Right shoulder injury - overuse rotator cuff strain.  Unlikely torn given he did not have an acute injury (partial tear from fraying is possible but motion better than would expect for full thickness tear).  Start with conservative treatment - physical therapy, nsaids (advised to discuss with other physicians -heart, kidney - if he can take these).  Subacromial injection given today.  F/u in 5-6 weeks.  After informed written consent, patient was seated on exam table. Right shoulder was prepped with alcohol swab and utilizing posterior approach,  patient's right subacromial space was injected with 3:1 marcaine: depomedrol. Patient tolerated the procedure well without immediate complications.

## 2013-07-03 IMAGING — CR DG CHEST 2V
2 series · 2 of 2 positions shown · non-contrast
Comparison: 01/15/2012

CLINICAL DATA: Shortness of breath, abdominal pain and distention.

CHEST - 2 VIEW

[w chest pa]
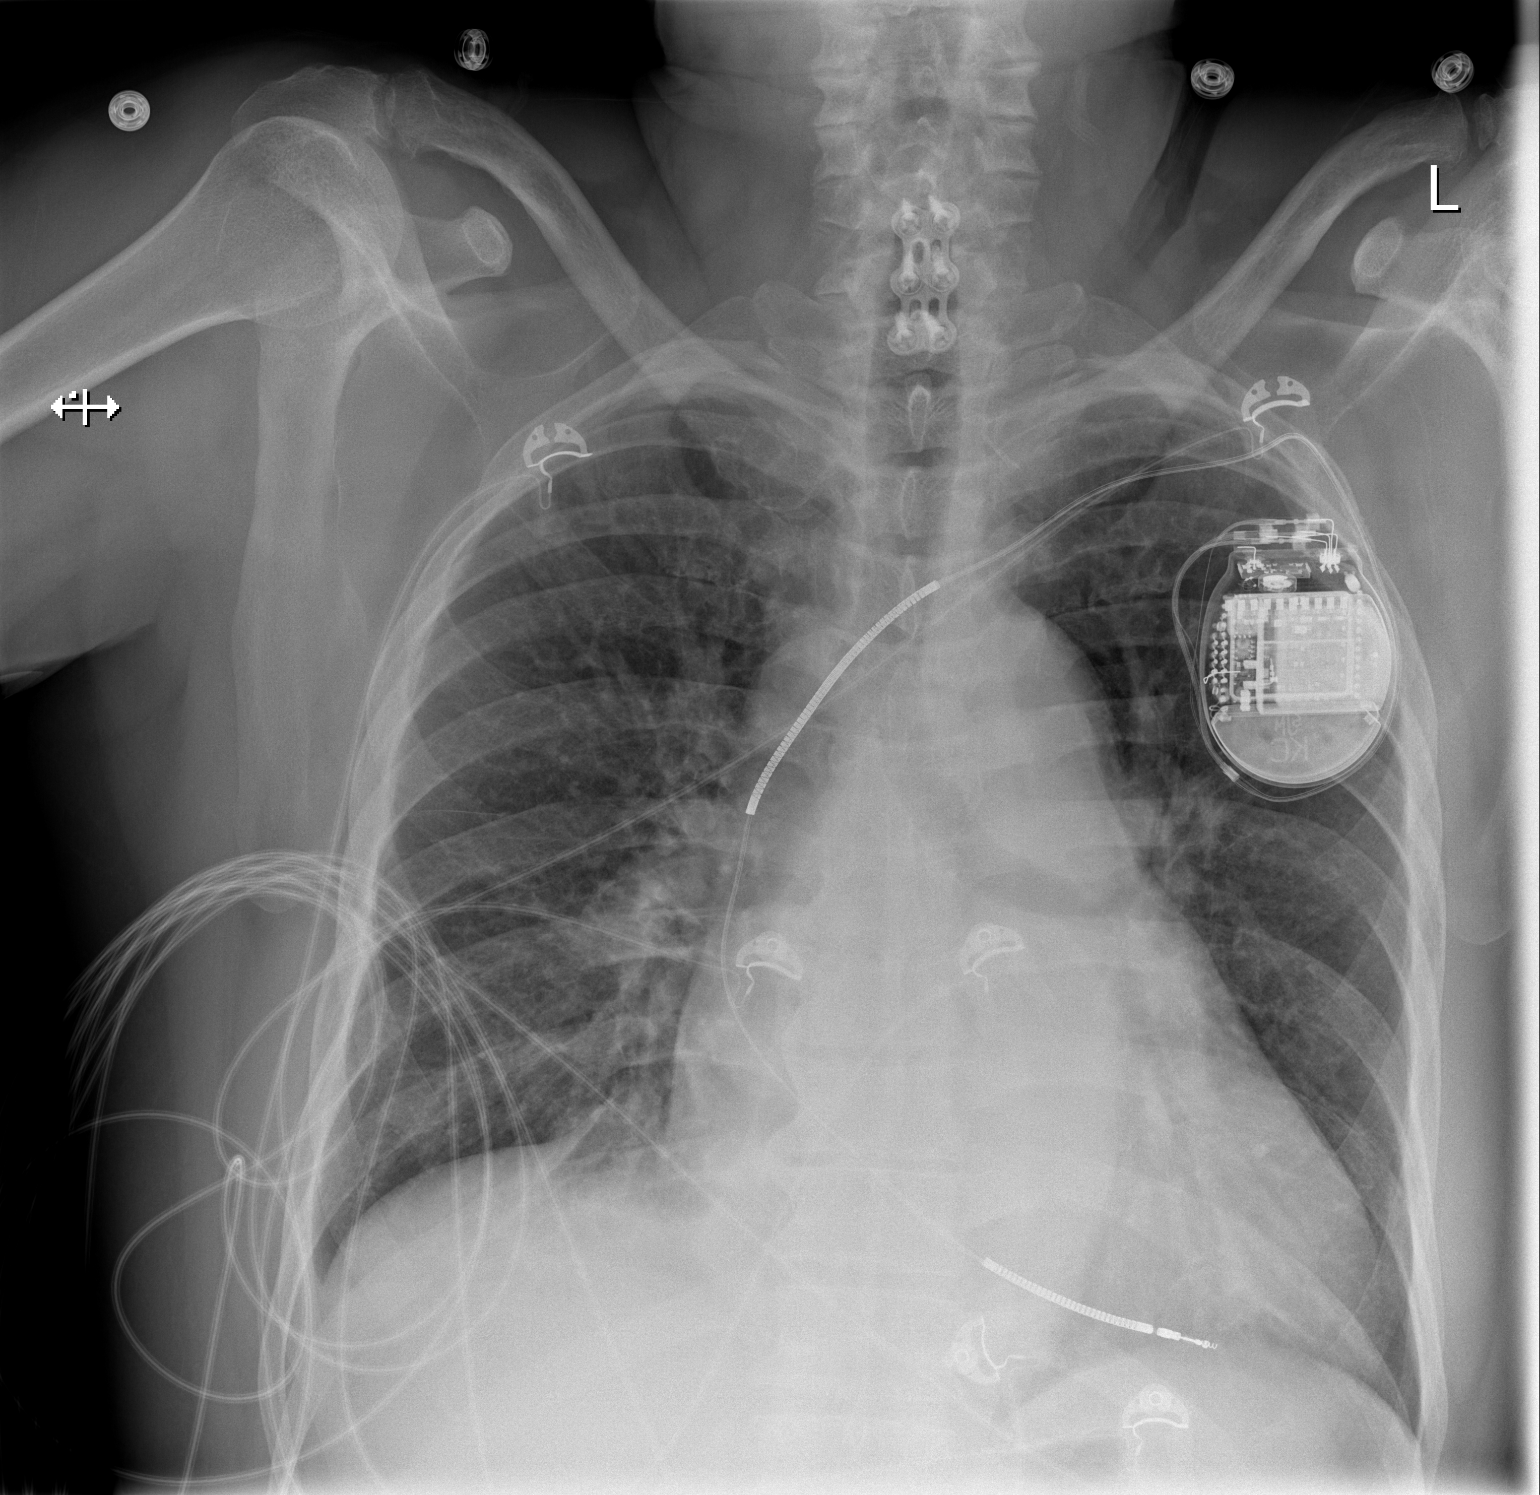

[w chest lat]
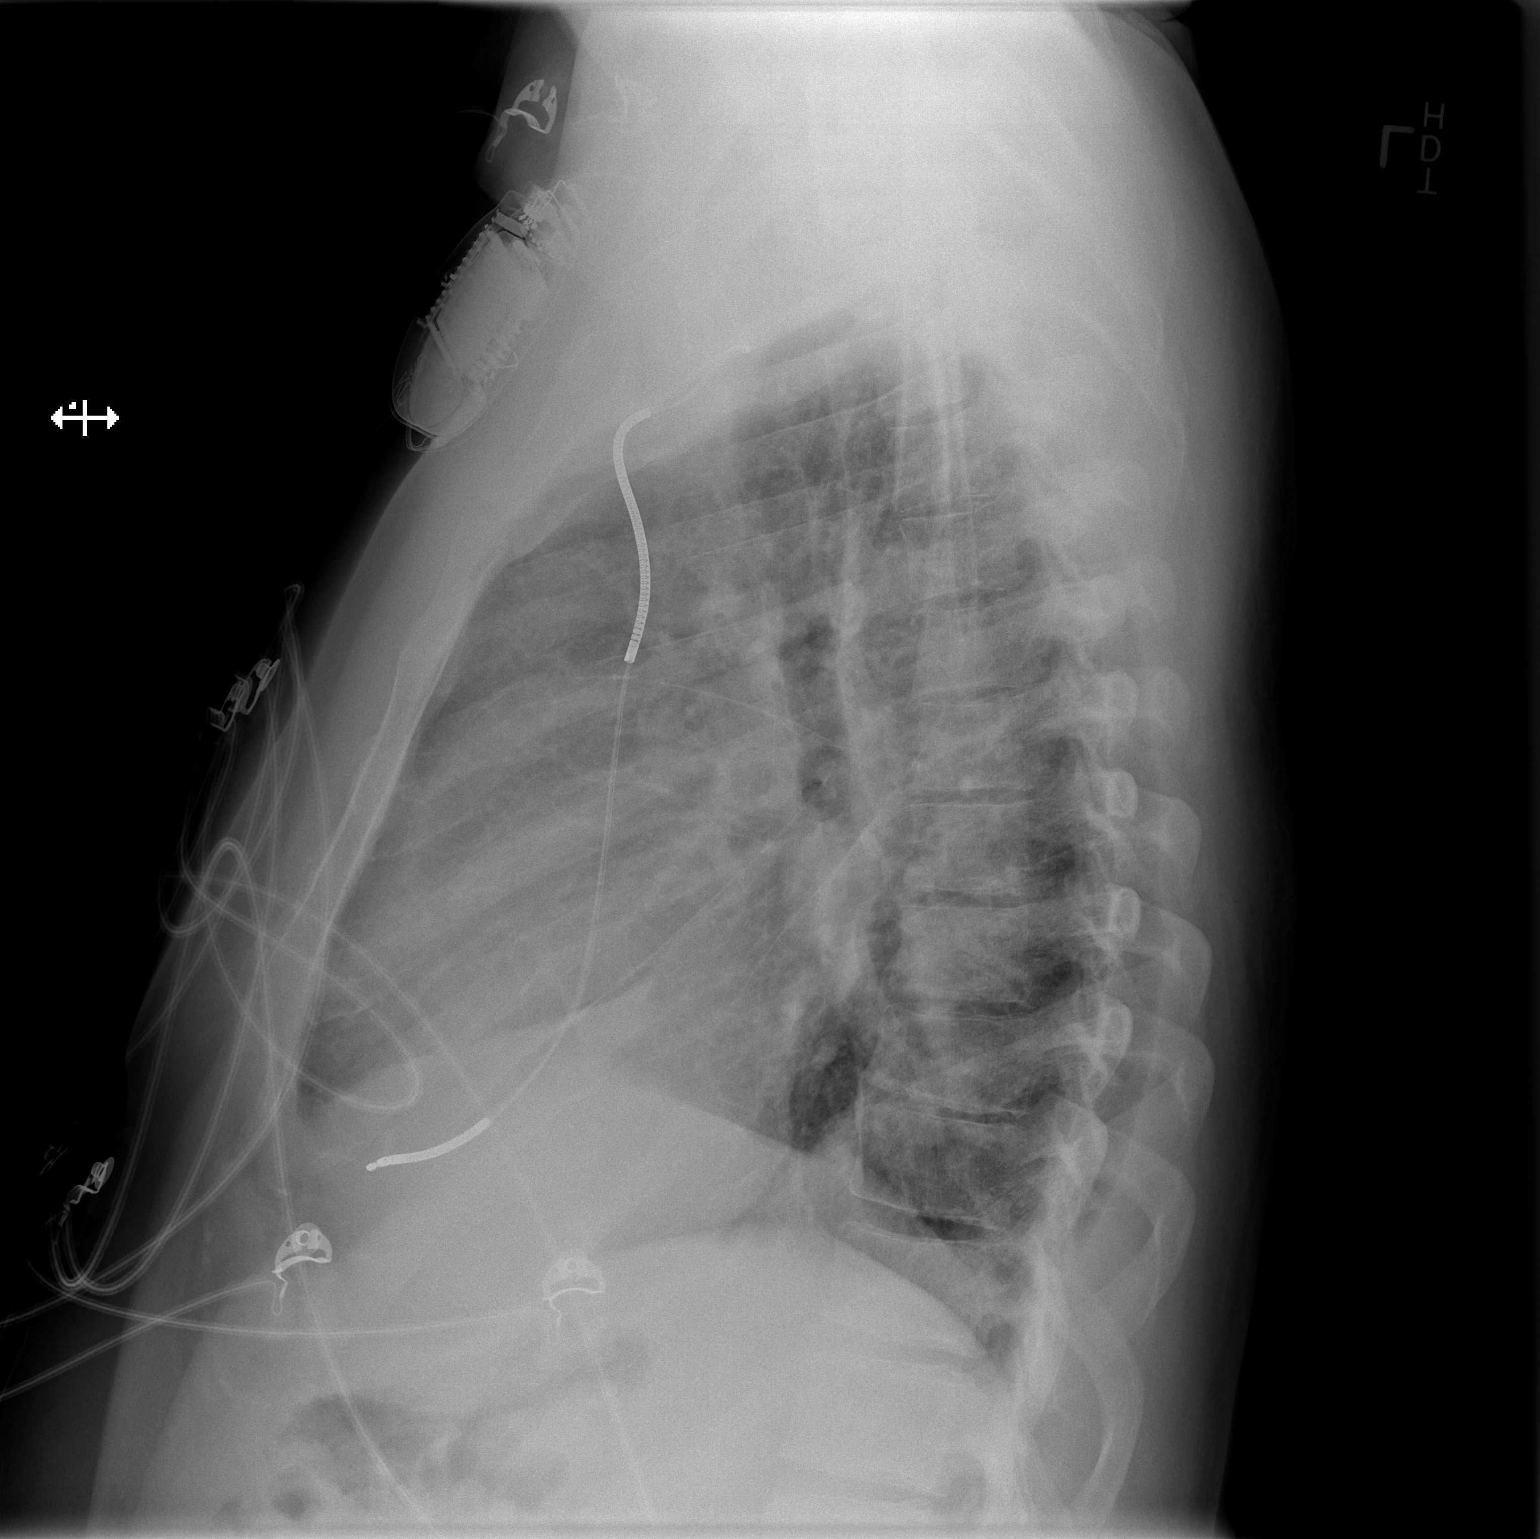

[2 of 2 positions shown; findings below may reference images not displayed]

FINDINGS: Cardiac enlargement is present.  Single lead AICD from a
left subclavian approach.  Mild vascular congestion without focal
infiltrates or failure.  No pneumothorax.  No effusion.  Prior
cervical fusion.
IMPRESSION: Cardiac enlargement with mild vascular congestion.  Slight
worsening aeration from priors.  No overt failure or infiltrates.

## 2013-07-08 ENCOUNTER — Ambulatory Visit: Payer: Medicare Other | Attending: Family Medicine

## 2013-07-08 DIAGNOSIS — M25519 Pain in unspecified shoulder: Secondary | ICD-10-CM | POA: Insufficient documentation

## 2013-07-08 DIAGNOSIS — M25619 Stiffness of unspecified shoulder, not elsewhere classified: Secondary | ICD-10-CM | POA: Diagnosis not present

## 2013-07-08 IMAGING — CR DG CHEST 1V PORT
1 series · 1 of 1 positions shown · non-contrast
Comparison: 01/19/2002

CLINICAL DATA: Shortness of breath, chest pain, cough

PORTABLE CHEST - 1 VIEW

[AP]
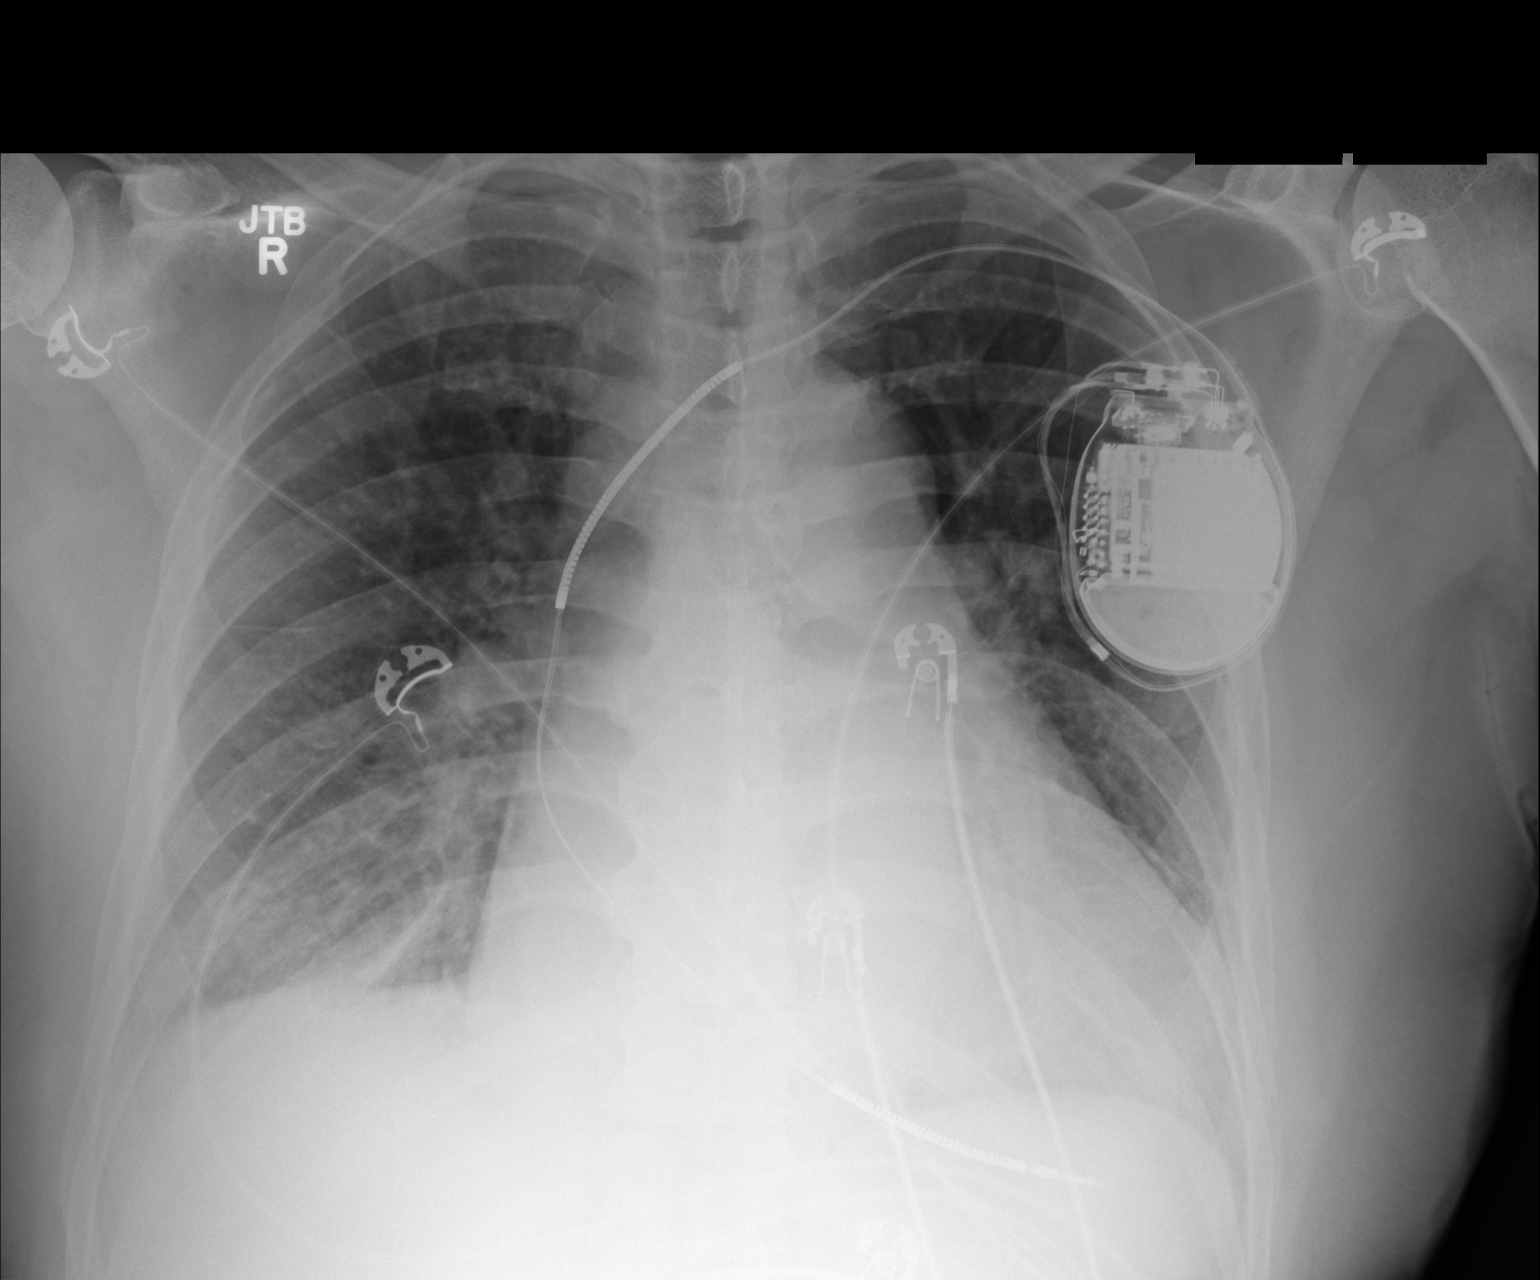

[1 of 1 positions shown; findings below may reference images not displayed]

FINDINGS: .  Cardiomegaly.  Single lead cardiac pacemaker is
unchanged in position.  No pulmonary edema.  There is hazy right
basilar atelectasis or infiltrate.
IMPRESSION: Cardiomegaly.  Hazy right basilar atelectasis or infiltrate.  No
convincing pulmonary edema.

## 2013-07-16 ENCOUNTER — Encounter: Admitting: Physical Therapy

## 2013-07-16 ENCOUNTER — Encounter

## 2013-07-17 IMAGING — CR DG CHEST 1V PORT
1 series · 1 of 1 positions shown · non-contrast
Comparison: 01/25/2012; 01/20/2012

CLINICAL DATA: Confirm PICC line placement

PORTABLE CHEST - 1 VIEW

[AP]
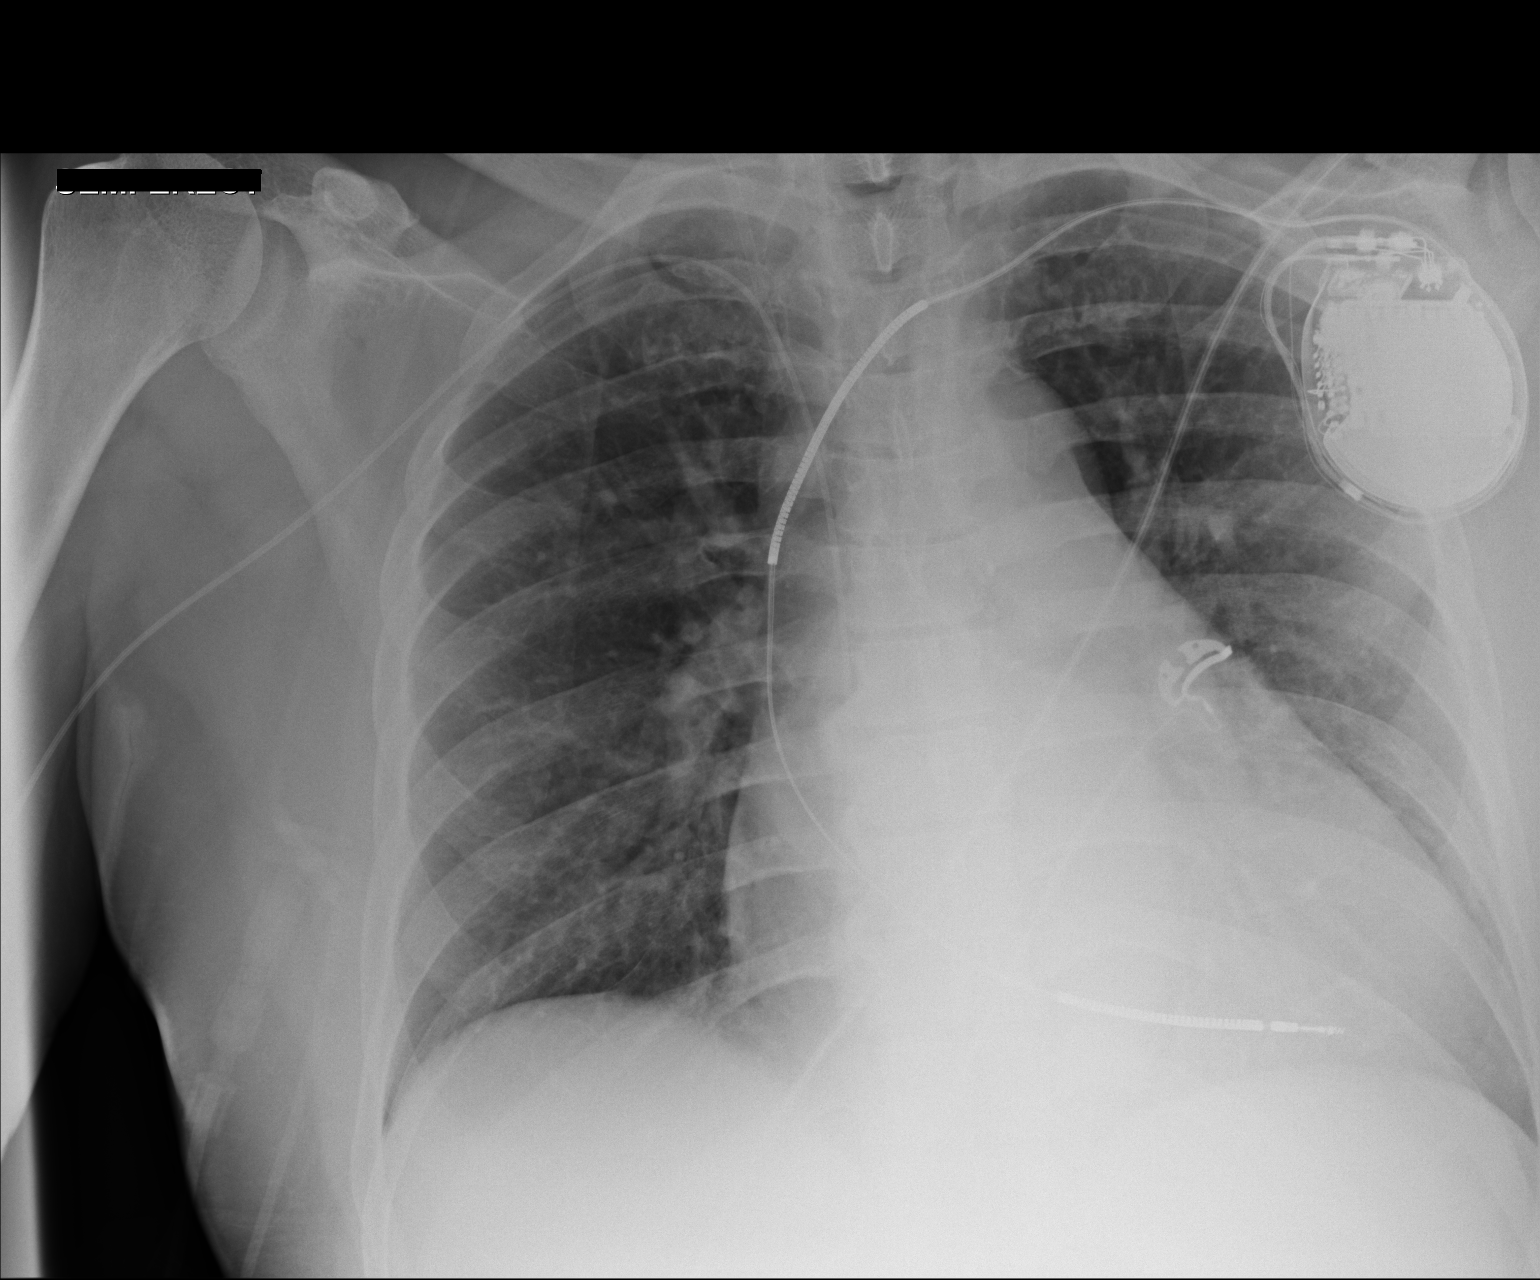

[1 of 1 positions shown; findings below may reference images not displayed]

FINDINGS: Grossly unchanged enlarged cardiac silhouette mediastinal contours.
Interval placement right upper extremity approach PICC line with
tip projected over the superior cavoatrial junction.  Otherwise,
stable position of support apparatus.  Mild pulmonary venous
congestion without frank evidence of edema.] Minimal improved
aeration of the bilateral lung bases with persistent left
basilar/retrocardiac opacity.  No new focal airspace opacity.  No
definite pleural effusion or pneumothorax.  Unchanged bones of the
lower cervical ACDF, incompletely imaged.
IMPRESSION: 1.  Right upper extremity approach PICC line tip projects of the
superior cavoatrial junction.
2.  Pulmonary venous congestion with improved basilar atelectasis.

## 2013-07-18 ENCOUNTER — Encounter

## 2013-07-18 ENCOUNTER — Encounter: Payer: Self-pay | Admitting: Internal Medicine

## 2013-07-18 ENCOUNTER — Encounter: Admitting: Physical Therapy

## 2013-07-22 ENCOUNTER — Ambulatory Visit: Payer: Medicare Other | Admitting: Rehabilitation

## 2013-07-22 DIAGNOSIS — M25519 Pain in unspecified shoulder: Secondary | ICD-10-CM | POA: Diagnosis not present

## 2013-07-23 ENCOUNTER — Inpatient Hospital Stay (HOSPITAL_COMMUNITY): Admission: RE | Admit: 2013-07-23 | Source: Ambulatory Visit

## 2013-07-24 ENCOUNTER — Ambulatory Visit (INDEPENDENT_AMBULATORY_CARE_PROVIDER_SITE_OTHER): Payer: PRIVATE HEALTH INSURANCE | Admitting: Family Medicine

## 2013-07-24 ENCOUNTER — Encounter: Payer: Self-pay | Admitting: Family Medicine

## 2013-07-24 ENCOUNTER — Encounter: Admitting: Physical Therapy

## 2013-07-24 VITALS — BP 107/72 | HR 139 | Ht 75.0 in | Wt 243.0 lb

## 2013-07-24 DIAGNOSIS — M25511 Pain in right shoulder: Secondary | ICD-10-CM

## 2013-07-24 DIAGNOSIS — M25519 Pain in unspecified shoulder: Secondary | ICD-10-CM

## 2013-07-28 ENCOUNTER — Ambulatory Visit: Payer: Medicare Other

## 2013-07-28 ENCOUNTER — Encounter: Payer: Self-pay | Admitting: Family Medicine

## 2013-07-28 NOTE — Progress Notes (Signed)
Patient ID: [name], male   DOB: 1950-09-12, 63 y.o.   MRN: [id number]  PCP: Nance Pear., NP Referred by: Debbrah Alar NP  Subjective:   HPI: Patient is a 63 y.o. male here for right shoulder injury.  6/25: Patient reports about 2 weeks ago he was helping clean up at his church. Was mopping with a big industrial mop. No acute injury but by next day shoulder was painful and has worsened since. No night pain. Takes pain medicine regularly for chronic pain. Some radiation into neck and right chest. No numbness/tingling.  7/23: Patient reports his shoulder feels better. Is s/p injection and did PT for shoulder, 3 visits, but felt more sick doing this. Still doing home exercise program though. Some night pain - can't lie down on this side.  Past Medical History  Diagnosis Date  . CHF (congestive heart failure)   . Sarcoidosis   . Cardiomyopathy, dilated, nonischemic     non ischemic by cath  . Acute on chronic systolic heart failure   . Automatic implantable cardiac defibrillator in situ   . Atrial fibrillation   . NSVT (nonsustained ventricular tachycardia)   . GERD (gastroesophageal reflux disease)   . Hypercholesteremia   . Myocardial infarction   . Shortness of breath   . Chronic kidney disease (CKD), stage III (moderate)   . Pacemaker   . Anginal pain   . Gout   . Hypertension     dr [name]  . Coronary artery disease   . Elevated PSA 06/24/2013    Current Outpatient Prescriptions on File Prior to Visit  Medication Sig Dispense Refill  . allopurinol (ZYLOPRIM) 100 MG tablet Take 1 tablet (100 mg total) by mouth daily.  30 tablet  6  . ALPRAZolam (XANAX) 0.5 MG tablet Take 0.5 mg by mouth 3 (three) times daily as needed for anxiety.      Marland Kitchen amiodarone (PACERONE) 200 MG tablet Take 200 mg by mouth 2 (two) times daily.      Marland Kitchen amoxicillin (AMOXIL) 500 MG capsule Take 1 capsule (500 mg total) by mouth 3 (three) times daily.  30 capsule  0  .  apixaban (ELIQUIS) 5 MG TABS tablet Take 1 tablet (5 mg total) by mouth 2 (two) times daily.  60 tablet  4  . carvedilol (COREG) 3.125 MG tablet Take 3.125 mg by mouth 2 (two) times daily with a meal.      . eplerenone (INSPRA) 25 MG tablet Take 1 tablet (25 mg total) by mouth daily.  30 tablet  3  . esomeprazole (NEXIUM) 40 MG capsule Take 1 capsule (40 mg total) by mouth daily.  30 capsule  3  . fentaNYL (DURAGESIC - DOSED MCG/HR) 12 MCG/HR       . fluticasone (FLONASE) 50 MCG/ACT nasal spray Place 2 sprays into both nostrils daily as needed for allergies.       . Gabapentin, PHN, (GRALISE STARTER) 300 & 600 MG MISC Take by mouth.      . hydrOXYzine (ATARAX/VISTARIL) 25 MG tablet Take 25 mg by mouth 3 (three) times daily as needed for itching.      Marland Kitchen LORazepam (ATIVAN) 0.5 MG tablet Take 1-2 tablets (0.5-1 mg total) by mouth 2 (two) times daily as needed for anxiety.  50 tablet  0  . metolazone (ZAROXOLYN) 2.5 MG tablet Take 1 tablet (2.5 mg total) by mouth daily as needed (for fluid retention).  10 tablet  1  . milrinone (PRIMACOR) 20 MG/100ML  SOLN infusion Inject 0.375 mcg/kg/min into the vein continuous. 440mg  in 45ml infusion 2.5 ml per hour      . Oxycodone HCl 20 MG TABS Take 1 tablet (20 mg total) by mouth 3 (three) times daily as needed.  90 tablet  0  . oxyCODONE-acetaminophen (PERCOCET/ROXICET) 5-325 MG per tablet       . potassium chloride SA (K-DUR,KLOR-CON) 20 MEQ tablet Take 60 meq (3 tablets) in the morning and 40 meq (2 tablets) in the evening.  150 tablet  3  . promethazine (PHENERGAN) 25 MG tablet Take 25 mg by mouth every 6 (six) hours as needed for nausea or vomiting.      . ranitidine (ZANTAC) 150 MG capsule Take 150 mg by mouth 2 (two) times daily.      Marland Kitchen senna-docusate (SENOKOT-S) 8.6-50 MG per tablet Take 1-2 tablets by mouth 2 (two) times daily as needed for mild constipation or moderate constipation.      . sertraline (ZOLOFT) 50 MG tablet Take 1 tablet (50 mg total) by  mouth daily.  30 tablet  5  . sildenafil (VIAGRA) 100 MG tablet Take 1 tablet (100 mg total) by mouth daily as needed for erectile dysfunction.  5 tablet  1  . spironolactone (ALDACTONE) 25 MG tablet       . torsemide (DEMADEX) 20 MG tablet Take 4 tablets (80 mg total) by mouth daily.  120 tablet  3  . zolpidem (AMBIEN) 5 MG tablet Take 1 tablet (5 mg total) by mouth at bedtime as needed for sleep.  30 tablet  0   No current facility-administered medications on file prior to visit.    Past Surgical History  Procedure Laterality Date  . Back surgery  1987    Ruptured disk repair  . Pacemaker insertion      with ICD  . Tee without cardioversion  01/17/2011    Procedure: TRANSESOPHAGEAL ECHOCARDIOGRAM (TEE);  Surgeon: Birdie Riddle, MD;  Location: [place];  Service: Cardiovascular;  Laterality: N/A;  . Cardioversion  01/17/2011    Procedure: CARDIOVERSION;  Surgeon: Birdie Riddle, MD;  Location: [place];  Service: Cardiovascular;  Laterality: N/A;  . Cardiac catheterization    . Insert / replace / remove pacemaker    . Anterior cervical decomp/discectomy fusion  08/21/2011    Procedure: ANTERIOR CERVICAL DECOMPRESSION/DISCECTOMY FUSION 2 LEVELS;  Surgeon: Marybelle Killings, MD;  Location: [place];  Service: Orthopedics;  Laterality: N/A;  C5-6, C6-7 Anterior Cervical Discectomy and Fusion, allograft, plate  . Tee without cardioversion N/A 02/16/2012    Procedure: TRANSESOPHAGEAL ECHOCARDIOGRAM (TEE);  Surgeon: Jolaine Artist, MD;  Location: [facility] ENDOSCOPY;  Service: Cardiovascular;  Laterality: N/A;  original case scheduled under his dad (who is deceased), rescheduled under correct mrn/pt/dob. [place]/dl  . Tee without cardioversion N/A 03/22/2012    Procedure: TRANSESOPHAGEAL ECHOCARDIOGRAM (TEE);  Surgeon: Lelon Perla, MD;  Location: [place];  Service: Cardiovascular;  Laterality: N/A;  . Cardioversion N/A 03/22/2012    Procedure: CARDIOVERSION;  Surgeon: Lelon Perla, MD;   Location: [facility] ENDOSCOPY;  Service: Cardiovascular;  Laterality: N/A;  . Cardioversion N/A 04/26/2012    Procedure: CARDIOVERSION;  Surgeon: Jolaine Artist, MD;  Location: [facility] ENDOSCOPY;  Service: Cardiovascular;  Laterality: N/A;  . Central venous catheter tunneled insertion single lumen  09/16/2012    right IJ  . Cardioversion N/A 11/08/2012    Procedure: CARDIOVERSION;  Surgeon: Jolaine Artist, MD;  Location: [place];  Service: Cardiovascular;  Laterality: N/A;    Allergies  Allergen Reactions  . Nitroglycerin Other (See Comments)    "feels like head is going to bust open"    History   Social History  . Marital Status: Single    Spouse Name: N/A    Number of Children: N/A  . Years of Education: N/A   Occupational History  . Not on file.   Social History Main Topics  . Smoking status: Never Smoker   . Smokeless tobacco: Never Used  . Alcohol Use: No  . Drug Use: Yes    Special: "Crack" cocaine, Marijuana, Cocaine     Comment: last use Novemenber  . Sexual Activity: Not Currently   Other Topics Concern  . Not on file   Social History Narrative   3 sons- one in [place], 1 in [place], on in [place]   Retired from post office- on disability   Completed Bachelors from [employer]    Family History  Problem Relation Age of Onset  . Heart disease    . Heart failure    . Stroke    . Anesthesia problems Neg Hx   . Hypotension Neg Hx   . Malignant hyperthermia Neg Hx   . Pseudochol deficiency Neg Hx     BP 107/72  Pulse 139  Ht 6\' 3"  (1.905 m)  Wt 243 lb (110.224 kg)  BMI 30.37 kg/m2  Review of Systems: See HPI above.    Objective:  Physical Exam:  Gen: NAD  Right shoulder: No swelling, ecchymoses.  No gross deformity. TTP right cervical paraspinal muscles, trapezius, superior pectoralis. Abduction and flexion to 130 degrees, full ER. Mild positive Hawkins, Neers. Negative Speeds, Yergasons. Strength 5-/5 with empty can and 5/5 resisted internal/external  rotation. Negative apprehension. NV intact distally.    Assessment & Plan:  1. Right shoulder injury - overuse rotator cuff strain.  Improved with injection and home exercises.  Seemed to cause more pain in PT so he's only doing home exercises now.  He would like to continue with current treatment.  Would be poor candidate for surgical intervention.  We discussed nitro patches if not improving.  Could repeat injection as well.  F/u in 6 weeks or prn.

## 2013-07-28 NOTE — Assessment & Plan Note (Signed)
overuse rotator cuff strain.  Improved with injection and home exercises.  Seemed to cause more pain in PT so he's only doing home exercises now.  He would like to continue with current treatment.  Would be poor candidate for surgical intervention.  We discussed nitro patches if not improving.  Could repeat injection as well.  F/u in 6 weeks or prn.

## 2013-08-06 ENCOUNTER — Emergency Department (HOSPITAL_COMMUNITY): Payer: Medicare Other

## 2013-08-06 ENCOUNTER — Encounter (HOSPITAL_COMMUNITY): Payer: Self-pay | Admitting: Emergency Medicine

## 2013-08-06 ENCOUNTER — Emergency Department (HOSPITAL_COMMUNITY)
Admission: EM | Admit: 2013-08-06 | Discharge: 2013-08-06 | Disposition: A | Discharge status: Home / Self Care | Payer: Medicare Other | Attending: Emergency Medicine | Admitting: Emergency Medicine

## 2013-08-06 ENCOUNTER — Telehealth (HOSPITAL_COMMUNITY): Payer: Self-pay | Admitting: *Deleted

## 2013-08-06 DIAGNOSIS — N184 Chronic kidney disease, stage 4 (severe): Secondary | ICD-10-CM

## 2013-08-06 DIAGNOSIS — Z95810 Presence of automatic (implantable) cardiac defibrillator: Secondary | ICD-10-CM | POA: Diagnosis not present

## 2013-08-06 DIAGNOSIS — I252 Old myocardial infarction: Secondary | ICD-10-CM | POA: Diagnosis not present

## 2013-08-06 DIAGNOSIS — I5023 Acute on chronic systolic (congestive) heart failure: Secondary | ICD-10-CM | POA: Diagnosis not present

## 2013-08-06 DIAGNOSIS — R112 Nausea with vomiting, unspecified: Secondary | ICD-10-CM | POA: Insufficient documentation

## 2013-08-06 DIAGNOSIS — I4891 Unspecified atrial fibrillation: Secondary | ICD-10-CM | POA: Diagnosis not present

## 2013-08-06 DIAGNOSIS — N183 Chronic kidney disease, stage 3 unspecified: Secondary | ICD-10-CM | POA: Diagnosis not present

## 2013-08-06 DIAGNOSIS — Z8619 Personal history of other infectious and parasitic diseases: Secondary | ICD-10-CM | POA: Diagnosis not present

## 2013-08-06 DIAGNOSIS — Z79899 Other long term (current) drug therapy: Secondary | ICD-10-CM | POA: Insufficient documentation

## 2013-08-06 DIAGNOSIS — E876 Hypokalemia: Secondary | ICD-10-CM | POA: Insufficient documentation

## 2013-08-06 DIAGNOSIS — I2510 Atherosclerotic heart disease of native coronary artery without angina pectoris: Secondary | ICD-10-CM | POA: Diagnosis not present

## 2013-08-06 DIAGNOSIS — I509 Heart failure, unspecified: Secondary | ICD-10-CM | POA: Diagnosis not present

## 2013-08-06 DIAGNOSIS — I129 Hypertensive chronic kidney disease with stage 1 through stage 4 chronic kidney disease, or unspecified chronic kidney disease: Secondary | ICD-10-CM | POA: Insufficient documentation

## 2013-08-06 DIAGNOSIS — R079 Chest pain, unspecified: Secondary | ICD-10-CM | POA: Diagnosis present

## 2013-08-06 DIAGNOSIS — I4949 Other premature depolarization: Secondary | ICD-10-CM

## 2013-08-06 DIAGNOSIS — Z9861 Coronary angioplasty status: Secondary | ICD-10-CM | POA: Diagnosis not present

## 2013-08-06 LAB — CBC WITH DIFFERENTIAL/PLATELET
BASOS ABS: 0 10*3/uL (ref 0.0–0.1)
Basophils Relative: 0 % (ref 0–1)
Eosinophils Absolute: 0.2 10*3/uL (ref 0.0–0.7)
Eosinophils Relative: 4 % (ref 0–5)
HEMATOCRIT: 41.5 % (ref 39.0–52.0)
HEMOGLOBIN: 14 g/dL (ref 13.0–17.0)
LYMPHS ABS: 2 10*3/uL (ref 0.7–4.0)
LYMPHS PCT: 38 % (ref 12–46)
MCH: 30 pg (ref 26.0–34.0)
MCHC: 33.7 g/dL (ref 30.0–36.0)
MCV: 88.9 fL (ref 78.0–100.0)
MONO ABS: 0.8 10*3/uL (ref 0.1–1.0)
Monocytes Relative: 16 % — ABNORMAL HIGH (ref 3–12)
NEUTROS ABS: 2.2 10*3/uL (ref 1.7–7.7)
Neutrophils Relative %: 42 % — ABNORMAL LOW (ref 43–77)
Platelets: 198 10*3/uL (ref 150–400)
RBC: 4.67 MIL/uL (ref 4.22–5.81)
RDW: 13.8 % (ref 11.5–15.5)
WBC: 5.2 10*3/uL (ref 4.0–10.5)

## 2013-08-06 LAB — COMPREHENSIVE METABOLIC PANEL
ALBUMIN: 3.4 g/dL — AB (ref 3.5–5.2)
ALT: 20 U/L (ref 0–53)
ANION GAP: 17 — AB (ref 5–15)
AST: 28 U/L (ref 0–37)
Alkaline Phosphatase: 84 U/L (ref 39–117)
BILIRUBIN TOTAL: 0.6 mg/dL (ref 0.3–1.2)
BUN: 28 mg/dL — AB (ref 6–23)
CHLORIDE: 99 meq/L (ref 96–112)
CO2: 24 mEq/L (ref 19–32)
Calcium: 9.6 mg/dL (ref 8.4–10.5)
Creatinine, Ser: 2.18 mg/dL — ABNORMAL HIGH (ref 0.50–1.35)
GFR calc Af Amer: 35 mL/min — ABNORMAL LOW (ref >90)
GFR calc non Af Amer: 30 mL/min — ABNORMAL LOW (ref >90)
Glucose, Bld: 83 mg/dL (ref 70–99)
POTASSIUM: 3.2 meq/L — AB (ref 3.7–5.3)
Sodium: 140 mEq/L (ref 137–147)
Total Protein: 7.5 g/dL (ref 6.0–8.3)

## 2013-08-06 LAB — URINALYSIS, ROUTINE W REFLEX MICROSCOPIC
Bilirubin Urine: NEGATIVE
Glucose, UA: NEGATIVE mg/dL
Hgb urine dipstick: NEGATIVE
KETONES UR: NEGATIVE mg/dL
LEUKOCYTES UA: NEGATIVE
NITRITE: NEGATIVE
PROTEIN: NEGATIVE mg/dL
Specific Gravity, Urine: 1.01 (ref 1.005–1.030)
Urobilinogen, UA: 0.2 mg/dL (ref 0.0–1.0)
pH: 5 (ref 5.0–8.0)

## 2013-08-06 LAB — I-STAT TROPONIN, ED: Troponin i, poc: 0.08 ng/mL (ref 0.00–0.08)

## 2013-08-06 LAB — PRO B NATRIURETIC PEPTIDE: Pro B Natriuretic peptide (BNP): 3225 pg/mL — ABNORMAL HIGH (ref 0–125)

## 2013-08-06 LAB — LIPASE, BLOOD: Lipase: 59 U/L (ref 11–59)

## 2013-08-06 MED ORDER — POTASSIUM CHLORIDE CRYS ER 20 MEQ PO TBCR
80.0000 meq | EXTENDED_RELEASE_TABLET | Freq: Once | ORAL | Status: AC
  Administered 2013-08-06: 80 meq via ORAL
  Filled 2013-08-06: qty 4

## 2013-08-06 MED ORDER — MORPHINE SULFATE 4 MG/ML IJ SOLN
4.0000 mg | Freq: Once | INTRAMUSCULAR | Status: AC
  Administered 2013-08-06: 4 mg via INTRAVENOUS
  Filled 2013-08-06: qty 1

## 2013-08-06 MED ORDER — ONDANSETRON HCL 4 MG/2ML IJ SOLN
4.0000 mg | Freq: Once | INTRAMUSCULAR | Status: AC
  Administered 2013-08-06: 4 mg via INTRAVENOUS
  Filled 2013-08-06: qty 2

## 2013-08-06 MED ORDER — FUROSEMIDE 10 MG/ML IJ SOLN
80.0000 mg | Freq: Once | INTRAMUSCULAR | Status: AC
  Administered 2013-08-06: 80 mg via INTRAVENOUS
  Filled 2013-08-06: qty 8

## 2013-08-06 NOTE — ED Notes (Addendum)
Patient presents today with a chief complaint of substernal chest pain and shortness of breath that has been present since this AM. Patient reports he has not taken his Milrinone x 3 days and has been coughing up yellow sputum this morning.

## 2013-08-06 NOTE — ED Notes (Signed)
Homehealth PA is at bedside hooking patient up to his Milrinone.

## 2013-08-06 NOTE — ED Provider Notes (Signed)
Medical screening examination/treatment/procedure(s) were conducted as a shared visit with non-physician practitioner(s) and myself.  I personally evaluated the patient during the encounter.   EKG Interpretation   Date/Time:  Wednesday August 06 2013 11:21:46 EDT Ventricular Rate:  78 PR Interval:  208 QRS Duration: 118 QT Interval:  354 QTC Calculation: 403 R Axis:   -79 Text Interpretation:  Sinus rhythm with frequent Premature ventricular  complexes Left axis deviation Incomplete left bundle branch block  Nonspecific T wave abnormality Abnormal ECG No significant change since  last tracing Confirmed by Rage Beever  MD, Anamari Galeas [phone]) on 08/06/2013 3:02:38  PM      Results for orders placed during the hospital encounter of 08/06/13  CBC WITH DIFFERENTIAL      Result Value Ref Range   WBC 5.2  4.0 - 10.5 K/uL   RBC 4.67  4.22 - 5.81 MIL/uL   Hemoglobin 14.0  13.0 - 17.0 g/dL   HCT 41.5  39.0 - 52.0 %   MCV 88.9  78.0 - 100.0 fL   MCH 30.0  26.0 - 34.0 pg   MCHC 33.7  30.0 - 36.0 g/dL   RDW 13.8  11.5 - 15.5 %   Platelets 198  150 - 400 K/uL   Neutrophils Relative % 42 (*) 43 - 77 %   Neutro Abs 2.2  1.7 - 7.7 K/uL   Lymphocytes Relative 38  12 - 46 %   Lymphs Abs 2.0  0.7 - 4.0 K/uL   Monocytes Relative 16 (*) 3 - 12 %   Monocytes Absolute 0.8  0.1 - 1.0 K/uL   Eosinophils Relative 4  0 - 5 %   Eosinophils Absolute 0.2  0.0 - 0.7 K/uL   Basophils Relative 0  0 - 1 %   Basophils Absolute 0.0  0.0 - 0.1 K/uL  PRO B NATRIURETIC PEPTIDE      Result Value Ref Range   Pro B Natriuretic peptide (BNP) 3225.0 (*) 0 - 125 pg/mL  COMPREHENSIVE METABOLIC PANEL      Result Value Ref Range   Sodium 140  137 - 147 mEq/L   Potassium 3.2 (*) 3.7 - 5.3 mEq/L   Chloride 99  96 - 112 mEq/L   CO2 24  19 - 32 mEq/L   Glucose, Bld 83  70 - 99 mg/dL   BUN 28 (*) 6 - 23 mg/dL   Creatinine, Ser 2.18 (*) 0.50 - 1.35 mg/dL   Calcium 9.6  8.4 - 10.5 mg/dL   Total Protein 7.5  6.0 - 8.3 g/dL    Albumin 3.4 (*) 3.5 - 5.2 g/dL   AST 28  0 - 37 U/L   ALT 20  0 - 53 U/L   Alkaline Phosphatase 84  39 - 117 U/L   Total Bilirubin 0.6  0.3 - 1.2 mg/dL   GFR calc non Af Amer 30 (*) >90 mL/min   GFR calc Af Amer 35 (*) >90 mL/min   Anion gap 17 (*) 5 - 15  LIPASE, BLOOD      Result Value Ref Range   Lipase 59  11 - 59 U/L  I-STAT TROPOININ, ED      Result Value Ref Range   Troponin i, poc 0.08  0.00 - 0.08 ng/mL   Comment 3            Results for orders placed during the hospital encounter of 08/06/13  CBC WITH DIFFERENTIAL      Result Value Ref Range  WBC 5.2  4.0 - 10.5 K/uL   RBC 4.67  4.22 - 5.81 MIL/uL   Hemoglobin 14.0  13.0 - 17.0 g/dL   HCT 41.5  39.0 - 52.0 %   MCV 88.9  78.0 - 100.0 fL   MCH 30.0  26.0 - 34.0 pg   MCHC 33.7  30.0 - 36.0 g/dL   RDW 13.8  11.5 - 15.5 %   Platelets 198  150 - 400 K/uL   Neutrophils Relative % 42 (*) 43 - 77 %   Neutro Abs 2.2  1.7 - 7.7 K/uL   Lymphocytes Relative 38  12 - 46 %   Lymphs Abs 2.0  0.7 - 4.0 K/uL   Monocytes Relative 16 (*) 3 - 12 %   Monocytes Absolute 0.8  0.1 - 1.0 K/uL   Eosinophils Relative 4  0 - 5 %   Eosinophils Absolute 0.2  0.0 - 0.7 K/uL   Basophils Relative 0  0 - 1 %   Basophils Absolute 0.0  0.0 - 0.1 K/uL  PRO B NATRIURETIC PEPTIDE      Result Value Ref Range   Pro B Natriuretic peptide (BNP) 3225.0 (*) 0 - 125 pg/mL  COMPREHENSIVE METABOLIC PANEL      Result Value Ref Range   Sodium 140  137 - 147 mEq/L   Potassium 3.2 (*) 3.7 - 5.3 mEq/L   Chloride 99  96 - 112 mEq/L   CO2 24  19 - 32 mEq/L   Glucose, Bld 83  70 - 99 mg/dL   BUN 28 (*) 6 - 23 mg/dL   Creatinine, Ser 2.18 (*) 0.50 - 1.35 mg/dL   Calcium 9.6  8.4 - 10.5 mg/dL   Total Protein 7.5  6.0 - 8.3 g/dL   Albumin 3.4 (*) 3.5 - 5.2 g/dL   AST 28  0 - 37 U/L   ALT 20  0 - 53 U/L   Alkaline Phosphatase 84  39 - 117 U/L   Total Bilirubin 0.6  0.3 - 1.2 mg/dL   GFR calc non Af Amer 30 (*) >90 mL/min   GFR calc Af Amer 35 (*) >90 mL/min    Anion gap 17 (*) 5 - 15  LIPASE, BLOOD      Result Value Ref Range   Lipase 59  11 - 59 U/L  I-STAT TROPOININ, ED      Result Value Ref Range   Troponin i, poc 0.08  0.00 - 0.08 ng/mL   Comment 3            Dg Chest 2 View  08/06/2013   CLINICAL DATA:  Chest pain.  Shortness of breath.  EXAM: CHEST  2 VIEW  COMPARISON:  06/09/2013.  FINDINGS: Mediastinum hilar structures normal. Cardiomegaly. Cardiac pacer with lead tip in the right ventricle. Central line in stable position. Lungs are clear. No pleural effusion or pneumothorax. Cervical spine fusion.  IMPRESSION: 1. Central in stable position.  Cardiac pacer stent position. 2. Stable cardiomegaly, no CHF. 3. No acute pulmonary disease.  Chest is stable from 06/09/2013.   Electronically Signed   By: Marcello Moores  Register   On: 08/06/2013 12:26    Patient seen by me. Patient with known history of congestive heart failure. Followed by Nani Ravens from cardiology. Patient with some increase shortness of breath here to day. Not been taking his Demadex medication could contribute to this. However chest x-ray negative for congestive heart failure. Her BNP is elevated significantly compared to  his most recent ones. We'll have cardiology evaluate disposition based on them. Patient does not have a black-and-white reason for admission at this time.  Fredia Sorrow, MD 08/06/13 [phone]

## 2013-08-06 NOTE — Progress Notes (Signed)
ED CM spoke with Carolynn Sayers from [facility] regarding patient's milrinone home infusion. She confirmed that Junie Bame NP from [facility] will restart the infusion once it arrives from [facility].  Anna RN on [employer] made aware that patient will have to wait until the continuous home infusion is restarted

## 2013-08-06 NOTE — ED Notes (Addendum)
Pt states he has had chest pain since yesterday, with nausea vomiting starting today. Pt has a PICC line and has not "had his Milrinone through his PICC line since Monday."

## 2013-08-06 NOTE — Discharge Instructions (Signed)
Your heart failure is worse due to not having your milrinone. Please take this when you get home. Return for new or worsening symptoms.   Heart Failure Heart failure is a condition in which the heart has trouble pumping blood. This means your heart does not pump blood efficiently for your body to work well. In some cases of heart failure, fluid may back up into your lungs or you may have swelling (edema) in your lower legs. Heart failure is usually a long-term (chronic) condition. It is important for you to take good care of yourself and follow your health care provider's treatment plan. CAUSES  Some health conditions can cause heart failure. Those health conditions include:  High blood pressure (hypertension). Hypertension causes the heart muscle to work harder than normal. When pressure in the blood vessels is high, the heart needs to pump (contract) with more force in order to circulate blood throughout the body. High blood pressure eventually causes the heart to become stiff and weak.  Coronary artery disease (CAD). CAD is the buildup of cholesterol and fat (plaque) in the arteries of the heart. The blockage in the arteries deprives the heart muscle of oxygen and blood. This can cause chest pain and may lead to a heart attack. High blood pressure can also contribute to CAD.  Heart attack (myocardial infarction). A heart attack occurs when one or more arteries in the heart become blocked. The loss of oxygen damages the muscle tissue of the heart. When this happens, part of the heart muscle dies. The injured tissue does not contract as well and weakens the heart's ability to pump blood.  Abnormal heart valves. When the heart valves do not open and close properly, it can cause heart failure. This makes the heart muscle pump harder to keep the blood flowing.  Heart muscle disease (cardiomyopathy or myocarditis). Heart muscle disease is damage to the heart muscle from a variety of causes. These can  include drug or alcohol abuse, infections, or unknown reasons. These can increase the risk of heart failure.  Lung disease. Lung disease makes the heart work harder because the lungs do not work properly. This can cause a strain on the heart, leading it to fail.  Diabetes. Diabetes increases the risk of heart failure. High blood sugar contributes to high fat (lipid) levels in the blood. Diabetes can also cause slow damage to tiny blood vessels that carry important nutrients to the heart muscle. When the heart does not get enough oxygen and food, it can cause the heart to become weak and stiff. This leads to a heart that does not contract efficiently.  Other conditions can contribute to heart failure. These include abnormal heart rhythms, thyroid problems, and low blood counts (anemia). Certain unhealthy behaviors can increase the risk of heart failure, including:  Being overweight.  Smoking or chewing tobacco.  Eating foods high in fat and cholesterol.  Abusing illicit drugs or alcohol.  Lacking physical activity. SYMPTOMS  Heart failure symptoms may vary and can be hard to detect. Symptoms may include:  Shortness of breath with activity, such as climbing stairs.  Persistent cough.  Swelling of the feet, ankles, legs, or abdomen.  Unexplained weight gain.  Difficulty breathing when lying flat (orthopnea).  Waking from sleep because of the need to sit up and get more air.  Rapid heartbeat.  Fatigue and loss of energy.  Feeling light-headed, dizzy, or close to fainting.  Loss of appetite.  Nausea.  Increased urination during the night (nocturia).  DIAGNOSIS  A diagnosis of heart failure is based on your history, symptoms, physical examination, and diagnostic tests. Diagnostic tests for heart failure may include:  Echocardiography.  Electrocardiography.  Chest X-ray.  Blood tests.  Exercise stress test.  Cardiac angiography.  Radionuclide scans. TREATMENT    Treatment is aimed at managing the symptoms of heart failure. Medicines, behavioral changes, or surgical intervention may be necessary to treat heart failure.  Medicines to help treat heart failure may include:  Angiotensin-converting enzyme (ACE) inhibitors. This type of medicine blocks the effects of a blood protein called angiotensin-converting enzyme. ACE inhibitors relax (dilate) the blood vessels and help lower blood pressure.  Angiotensin receptor blockers (ARBs). This type of medicine blocks the actions of a blood protein called angiotensin. Angiotensin receptor blockers dilate the blood vessels and help lower blood pressure.  Water pills (diuretics). Diuretics cause the kidneys to remove salt and water from the blood. The extra fluid is removed through urination. This loss of extra fluid lowers the volume of blood the heart pumps.  Beta blockers. These prevent the heart from beating too fast and improve heart muscle strength.  Digitalis. This increases the force of the heartbeat.  Healthy behavior changes include:  Obtaining and maintaining a healthy weight.  Stopping smoking or chewing tobacco.  Eating heart-healthy foods.  Limiting or avoiding alcohol.  Stopping illicit drug use.  Physical activity as directed by your health care provider.  Surgical treatment for heart failure may include:  A procedure to open blocked arteries, repair damaged heart valves, or remove damaged heart muscle tissue.  A pacemaker to improve heart muscle function and control certain abnormal heart rhythms.  An internal cardioverter defibrillator to treat certain serious abnormal heart rhythms.  A left ventricular assist device (LVAD) to assist the pumping ability of the heart. HOME CARE INSTRUCTIONS   Take medicines only as directed by your health care provider. Medicines are important in reducing the workload of your heart, slowing the progression of heart failure, and improving your  symptoms.  Do not stop taking your medicine unless directed by your health care provider.  Do not skip any dose of medicine.  Refill your prescriptions before you run out of medicine. Your medicines are needed every day.  Engage in moderate physical activity if directed by your health care provider. Moderate physical activity can benefit some people. The elderly and people with severe heart failure should consult with a health care provider for physical activity recommendations.  Eat heart-healthy foods. Food choices should be free of trans fat and low in saturated fat, cholesterol, and salt (sodium). Healthy choices include fresh or frozen fruits and vegetables, fish, lean meats, legumes, fat-free or low-fat dairy products, and whole grain or high fiber foods. Talk to a dietitian to learn more about heart-healthy foods.  Limit sodium if directed by your health care provider. Sodium restriction may reduce symptoms of heart failure in some people. Talk to a dietitian to learn more about heart-healthy seasonings.  Use healthy cooking methods. Healthy cooking methods include roasting, grilling, broiling, baking, poaching, steaming, or stir-frying. Talk to a dietitian to learn more about healthy cooking methods.  Limit fluids if directed by your health care provider. Fluid restriction may reduce symptoms of heart failure in some people.  Weigh yourself every day. Daily weights are important in the early recognition of excess fluid. You should weigh yourself every morning after you urinate and before you eat breakfast. Wear the same amount of clothing each time you weigh  yourself. Record your daily weight. Provide your health care provider with your weight record.  Monitor and record your blood pressure if directed by your health care provider.  Check your pulse if directed by your health care provider.  Lose weight if directed by your health care provider. Weight loss may reduce symptoms of heart  failure in some people.  Stop smoking or chewing tobacco. Nicotine makes your heart work harder by causing your blood vessels to constrict. Do not use nicotine gum or patches before talking to your health care provider.  Keep all follow-up visits as directed by your health care provider. This is important.  Limit alcohol intake to no more than 1 drink per day for nonpregnant women and 2 drinks per day for men. One drink equals 12 ounces of beer, 5 ounces of wine, or 1 ounces of hard liquor. Drinking more than that is harmful to your heart. Tell your health care provider if you drink alcohol several times a week. Talk with your health care provider about whether alcohol is safe for you. If your heart has already been damaged by alcohol or you have severe heart failure, drinking alcohol should be stopped completely.  Stop illicit drug use.  Stay up-to-date with immunizations. It is especially important to prevent respiratory infections through current pneumococcal and influenza immunizations.  Manage other health conditions such as hypertension, diabetes, thyroid disease, or abnormal heart rhythms as directed by your health care provider.  Learn to manage stress.  Plan rest periods when fatigued.  Learn strategies to manage high temperatures. If the weather is extremely hot:  Avoid vigorous physical activity.  Use air conditioning or fans or seek a cooler location.  Avoid caffeine and alcohol.  Wear loose-fitting, lightweight, and light-colored clothing.  Learn strategies to manage cold temperatures. If the weather is extremely cold:  Avoid vigorous physical activity.  Layer clothes.  Wear mittens or gloves, a hat, and a scarf when going outside.  Avoid alcohol.  Obtain ongoing education and support as needed.  Participate in or seek rehabilitation as needed to maintain or improve independence and quality of life. SEEK MEDICAL CARE IF:   Your weight increases by 03 lb/1.4 kg  in 1 day or 05 lb/2.3 kg in a week.  You have increasing shortness of breath that is unusual for you.  You are unable to participate in your usual physical activities.  You tire easily.  You cough more than normal, especially with physical activity.  You have any or more swelling in areas such as your hands, feet, ankles, or abdomen.  You are unable to sleep because it is hard to breathe.  You feel like your heart is beating fast (palpitations).  You become dizzy or light-headed upon standing up. SEEK IMMEDIATE MEDICAL CARE IF:   You have difficulty breathing.  There is a change in mental status such as decreased alertness or difficulty with concentration.  You have a pain or discomfort in your chest.  You have an episode of fainting (syncope). MAKE SURE YOU:   Understand these instructions.  Will watch your condition.  Will get help right away if you are not doing well or get worse. Document Released: 12/19/2004 Document Revised: 05/05/2013 Document Reviewed: 01/19/2012 [facility] Patient Information 2015 [place], [place]. This information is not intended to replace advice given to you by your health care provider. Make sure you discuss any questions you have with your health care provider.

## 2013-08-06 NOTE — ED Notes (Signed)
Pt unable to void at present

## 2013-08-06 NOTE — Telephone Encounter (Signed)
Pt's girlfriend Jocelyn Lamer called to let us know pt had been with here in [facility] and forget to take his new milrinone bag with him so he has been out since Monday, she states he is very sick and vomited this AM, she states he has already left [facility] and is in [facility] he is driving himself, advised pt needs to report to ER if he is that sick she states she will let him know, he is trying to make it back to [place], advised pt may need to stop at nearest ER she will let pt know

## 2013-08-06 NOTE — Consult Note (Signed)
Advanced Heart Failure Team History and Physical Note    Primary Cardiologist:  Dr. Haroldine Laws  Reason for Consult: HF   HPI:    [name] is a 63 y.o. gentlemen with severe HF due to NICM (EF 20-25%) with multiple hospital admissions for HF exacerbations. He also has history of ventricular tachycardia s/p St Jude ICD by Dr. Lovena Le, CKD: stage IV (Cr 3.0-3.5), and paroxysmal atrial arrhythmias on amiodarone as well as sarcoidosis and hypertension. Cath in 2008 by Dr. Terrence Dupont showed no CAD.Seen at [facility] and not felt to be a transplant candidate due to renal failure and lack of family support.  Milrinone initiated in January 2014 for low output. 03/01/12 milrinone increased 0.329mc/kg/min  He is followed closely in the HF clinic and has needed multiple DC-CV for afib with RVR. He has been on chronic milrinone 0.375 and was traveling to [facility] to see his girlfriend and forgot his replacement milrinone bag. He ran out of milrinone on Monday at 11 am and Monday evening started feeling nauseous, dizzy and increased SOB. He called [facility] to have medicine [employer] shipped to him however it did not show up and he headed back to [place] today. Presented to ED with nausea, SOB and fatigue. Pertinent labs: K+ 3.2, creatinine 2.18, pro-BNP 3225, troponin 0.08.   Review of Systems: [y] = yes, [ ]  = no   General: Weight gain [ ] ; Weight loss [ ] ; Anorexia [ ] ; Fatigue [Y ]; Fever [ ] ; Chills [ ] ; Weakness [Y ]  Cardiac: Chest pain/pressure [ ] ; Resting SOB [Y ]; Exertional SOB [Y ]; Orthopnea [ ] ; Pedal Edema [ ] ; Palpitations [ Y]; Syncope [ ] ; Presyncope [ ] ; Paroxysmal nocturnal dyspnea[ ]   Pulmonary: Cough [ ] ; Wheezing[ ] ; Hemoptysis[ ] ; Sputum [ ] ; Snoring [ ]   GI: Vomiting[ ] ; Dysphagia[ ] ; Melena[ ] ; Hematochezia [ ] ; Heartburn[ ] ; Abdominal pain [ ] ; Constipation [ ] ; Diarrhea [ ] ; BRBPR [ ]   GU: Hematuria[ ] ; Dysuria [ ] ; Nocturia[ ]   Vascular: Pain in legs with walking [ ] ; Pain in feet with  lying flat [ ] ; Non-healing sores [ ] ; Stroke [ ] ; TIA [ ] ; Slurred speech [ ] ;  Neuro: Headaches[ ] ; Vertigo[ ] ; Seizures[ ] ; Paresthesias[ ] ;Blurred vision [ ] ; Diplopia [ ] ; Vision changes [ ]   Ortho/Skin: Arthritis [ ] ; Joint pain [ ] ; Muscle pain [ ] ; Joint swelling [ ] ; Back Pain [ ] ; Rash [ ]   Psych: Depression[ ] ; Anxiety[ ]   Heme: Bleeding problems [ ] ; Clotting disorders [ ] ; Anemia [ ]   Endocrine: Diabetes [ ] ; Thyroid dysfunction[ ]   Home Medications Prior to Admission medications   Medication Sig Start Date End Date Taking? Authorizing Provider  allopurinol (ZYLOPRIM) 100 MG tablet Take 1 tablet (100 mg total) by mouth daily. 02/05/12  Yes Wynetta Emery, PA-C  amiodarone (PACERONE) 200 MG tablet Take 200 mg by mouth 2 (two) times daily.   Yes Historical Provider, MD  apixaban (ELIQUIS) 5 MG TABS tablet Take 1 tablet (5 mg total) by mouth 2 (two) times daily. 10/09/12  Yes Rande Brunt, NP  carvedilol (COREG) 3.125 MG tablet Take 3.125 mg by mouth 2 (two) times daily with a meal.   Yes Historical Provider, MD  eplerenone (INSPRA) 25 MG tablet Take 1 tablet (25 mg total) by mouth daily. 07/01/13  Yes Rande Brunt, NP  fluticasone (FLONASE) 50 MCG/ACT nasal spray Place 2 sprays into both nostrils daily as needed for allergies.  Yes Historical Provider, MD  LORazepam (ATIVAN) 0.5 MG tablet Take 1-2 tablets (0.5-1 mg total) by mouth 2 (two) times daily as needed for anxiety. 05/19/13  Yes Debbrah Alar, NP  metolazone (ZAROXOLYN) 2.5 MG tablet Take 1 tablet (2.5 mg total) by mouth daily as needed (for fluid retention). 04/21/13  Yes Shaune Pascal Riggs Dineen, MD  milrinone [facility]) 20 MG/100ML SOLN infusion Inject 0.375 mcg/kg/min into the vein continuous. 440mg  in 429ml infusion 2.5 ml per hour   Yes Historical Provider, MD  Oxycodone HCl 20 MG TABS Take 1 tablet (20 mg total) by mouth 3 (three) times daily as needed. 05/19/13  Yes Debbrah Alar, NP  potassium chloride SA  (K-DUR,KLOR-CON) 20 MEQ tablet Take 60 meq (3 tablets) in the morning and 40 meq (2 tablets) in the evening. 07/01/13  Yes Rande Brunt, NP  promethazine (PHENERGAN) 25 MG tablet Take 25 mg by mouth every 6 (six) hours as needed for nausea or vomiting.   Yes Historical Provider, MD  ranitidine (ZANTAC) 150 MG capsule Take 150 mg by mouth 2 (two) times daily.   Yes Historical Provider, MD  senna-docusate (SENOKOT-S) 8.6-50 MG per tablet Take 1-2 tablets by mouth 2 (two) times daily as needed for mild constipation or moderate constipation.   Yes Historical Provider, MD  torsemide (DEMADEX) 20 MG tablet Take 4 tablets (80 mg total) by mouth daily. 06/11/13  Yes Larey Dresser, MD  zolpidem (AMBIEN) 5 MG tablet Take 1 tablet (5 mg total) by mouth at bedtime as needed for sleep. 06/23/13  Yes Debbrah Alar, NP  oxyCODONE-acetaminophen (PERCOCET/ROXICET) 5-325 MG per tablet  05/15/13   Historical Provider, MD  spironolactone (ALDACTONE) 25 MG tablet  06/16/13   Historical Provider, MD    Past Medical History: Past Medical History  Diagnosis Date  . CHF (congestive heart failure)   . Sarcoidosis   . Cardiomyopathy, dilated, nonischemic     non ischemic by cath  . Acute on chronic systolic heart failure   . Automatic implantable cardiac defibrillator in situ   . Atrial fibrillation   . NSVT (nonsustained ventricular tachycardia)   . GERD (gastroesophageal reflux disease)   . Hypercholesteremia   . Myocardial infarction   . Shortness of breath   . Chronic kidney disease (CKD), stage III (moderate)   . Pacemaker   . Anginal pain   . Gout   . Hypertension     dr [name]  . Coronary artery disease   . Elevated PSA 06/24/2013    Past Surgical History: Past Surgical History  Procedure Laterality Date  . Back surgery  1987    Ruptured disk repair  . Pacemaker insertion      with ICD  . Tee without cardioversion  01/17/2011    Procedure: TRANSESOPHAGEAL ECHOCARDIOGRAM (TEE);  Surgeon:  Birdie Riddle, MD;  Location: [place];  Service: Cardiovascular;  Laterality: N/A;  . Cardioversion  01/17/2011    Procedure: CARDIOVERSION;  Surgeon: Birdie Riddle, MD;  Location: [place];  Service: Cardiovascular;  Laterality: N/A;  . Cardiac catheterization    . Insert / replace / remove pacemaker    . Anterior cervical decomp/discectomy fusion  08/21/2011    Procedure: ANTERIOR CERVICAL DECOMPRESSION/DISCECTOMY FUSION 2 LEVELS;  Surgeon: Marybelle Killings, MD;  Location: [place];  Service: Orthopedics;  Laterality: N/A;  C5-6, C6-7 Anterior Cervical Discectomy and Fusion, allograft, plate  . Tee without cardioversion N/A 02/16/2012    Procedure: TRANSESOPHAGEAL ECHOCARDIOGRAM (TEE);  Surgeon: Shaune Pascal  Antuan Limes, MD;  Location: [place] ENDOSCOPY;  Service: Cardiovascular;  Laterality: N/A;  original case scheduled under his dad (who is deceased), rescheduled under correct mrn/pt/dob. [place]/dl  . Tee without cardioversion N/A 03/22/2012    Procedure: TRANSESOPHAGEAL ECHOCARDIOGRAM (TEE);  Surgeon: Lelon Perla, MD;  Location: [place];  Service: Cardiovascular;  Laterality: N/A;  . Cardioversion N/A 03/22/2012    Procedure: CARDIOVERSION;  Surgeon: Lelon Perla, MD;  Location: [facility] ENDOSCOPY;  Service: Cardiovascular;  Laterality: N/A;  . Cardioversion N/A 04/26/2012    Procedure: CARDIOVERSION;  Surgeon: Jolaine Artist, MD;  Location: [facility] ENDOSCOPY;  Service: Cardiovascular;  Laterality: N/A;  . Central venous catheter tunneled insertion single lumen  09/16/2012    right IJ  . Cardioversion N/A 11/08/2012    Procedure: CARDIOVERSION;  Surgeon: Jolaine Artist, MD;  Location: [facility] ENDOSCOPY;  Service: Cardiovascular;  Laterality: N/A;    Family History: Family History  Problem Relation Age of Onset  . Heart disease    . Heart failure    . Stroke    . Anesthesia problems Neg Hx   . Hypotension Neg Hx   . Malignant hyperthermia Neg Hx   . Pseudochol deficiency Neg Hx     Social  History: History   Social History  . Marital Status: Single    Spouse Name: N/A    Number of Children: N/A  . Years of Education: N/A   Social History Main Topics  . Smoking status: Never Smoker   . Smokeless tobacco: Never Used  . Alcohol Use: No  . Drug Use: Yes    Special: "Crack" cocaine, Marijuana, Cocaine     Comment: last use Novemenber  . Sexual Activity: Not Currently   Other Topics Concern  . None   Social History Narrative   3 sons- one in [place], 1 in [place], on in [place]   Retired from post [employer]- on disability   Completed Bachelors from [employer]    Allergies:  Allergies  Allergen Reactions  . Nitroglycerin Other (See Comments)    "feels like head is going to bust open"    Objective:    Vital Signs:   Temp:  [97.8 F (36.6 C)] 97.8 F (36.6 C) (08/05 1124) Pulse Rate:  [54-75] 56 (08/05 1445) Resp:  [18-24] 20 (08/05 1445) BP: (79-112)/(62-85) 112/80 mmHg (08/05 1445) SpO2:  [92 %-99 %] 95 % (08/05 1445)   There were no vitals filed for this visit.  Physical Exam: General:  Chronically ill appearing. No resp difficulty HEENT: normal Neck: supple. JVP . Carotids 2+ bilat; no bruits. No lymphadenopathy or thryomegaly appreciated. Cor: PMI nondisplaced. Regular rate & rhythm. No rubs, gallops or murmurs. Lungs: clear Abdomen: soft, nontender, nondistended. No hepatosplenomegaly. No bruits or masses. Good bowel sounds. Extremities: no cyanosis, clubbing, rash, edema Neuro: alert & orientedx3, cranial nerves grossly intact. moves all 4 extremities w/o difficulty. Affect pleasant  Telemetry: SB 50s with PVCs  Labs: Basic Metabolic Panel:  Recent Labs Lab 08/06/13 1245  NA 140  K 3.2*  CL 99  CO2 24  GLUCOSE 83  BUN 28*  CREATININE 2.18*  CALCIUM 9.6    Liver Function Tests:  Recent Labs Lab 08/06/13 1245  AST 28  ALT 20  ALKPHOS 84  BILITOT 0.6  PROT 7.5  ALBUMIN 3.4*    Recent Labs Lab 08/06/13 1245  LIPASE 59   No results  found for this basename: AMMONIA,  in the last 168 hours  CBC:  Recent Labs Lab 08/06/13 1245  WBC 5.2  NEUTROABS 2.2  HGB 14.0  HCT 41.5  MCV 88.9  PLT 198    Cardiac Enzymes: No results found for this basename: CKTOTAL, CKMB, CKMBINDEX, TROPONINI,  in the last 168 hours  BNP: BNP (last 3 results)  Recent Labs  06/09/13 2212 06/11/13 1254 08/06/13 1245  PROBNP 564.0* 542.4* 3225.0*    CBG: No results found for this basename: GLUCAP,  in the last 168 hours  Coagulation Studies: No results found for this basename: LABPROT, INR,  in the last 72 hours  Other results: EKG: SR with PVCs 78 bpm  Imaging: Dg Chest 2 View  08/06/2013   CLINICAL DATA:  Chest pain.  Shortness of breath.  EXAM: CHEST  2 VIEW  COMPARISON:  06/09/2013.  FINDINGS: Mediastinum hilar structures normal. Cardiomegaly. Cardiac pacer with lead tip in the right ventricle. Central line in stable position. Lungs are clear. No pleural effusion or pneumothorax. Cervical spine fusion.  IMPRESSION: 1. Central in stable position.  Cardiac pacer stent position. 2. Stable cardiomegaly, no CHF. 3. No acute pulmonary disease.  Chest is stable from 06/09/2013.   Electronically Signed   By: Marcello Moores  Register   On: 08/06/2013 12:26         Assessment:   1) A/C systolic HF d/t NICM 2) Multiple PVCs 3) Hypokalemia 4) Stage 3-4 CKD   Plan/Discussion:    Mr. Hedman is well known to the HF team and has been on home milrinone 0.375 for over a year now. He went to the beach and forgot his milrinone bag and started developing acute SOB, fatigue and nausea. He drove back from [facility] today and came immediately to the ED.   He does not appear markedly volume overloaded but does have some volume on board. Will give 80 mg IV lasix with 80 meq of potassium.   Will have [facility] hook milrinone back up and patient can go home with close follow up in the HF clinic.   Length of Stay: 0 Rande Brunt NP-C 08/06/2013,  3:55 PM   Advanced Heart Failure Team Pager [phone] (M-F; 7a - 4p)  Please contact [facility] for night-coverage after hours (4p -7a ) and weekends on [email or web address]  Patient seen and examined with Darrell Bame, NP. We discussed all aspects of the encounter. I agree with the assessment and plan as stated above.   He has mildly decompensated HF in setting of running out of milrinone. Will give 1 dose of lasix 80 IV and supp with kcl 80 po. We have contacted [place] who will resume milrinone in the ER and I think he can be safely discharged home with close f/u in HF Clinic. Long talk about need for compliance with HF regimen.   Darrell Rae,MD 4:41 PM

## 2013-08-06 NOTE — ED Provider Notes (Signed)
CSN: [id number]     Arrival date & time 08/06/13  1115 History   First MD Initiated Contact with Patient 08/06/13 1238     Chief Complaint  Patient presents with  . Chest Pain  . Shortness of Breath     (Consider location/radiation/quality/duration/timing/severity/associated sxs/prior Treatment) HPI Comments: Patient is a 63 year old male with history of congestive heart failure, end-stage renal disease, sarcoidosis, atrial fibrillation, hypercholesterolemia, coronary artery disease, hypertension who presents to the emergency department today with generalized weakness. He states that he has been getting gradually worse since Monday. He had associated substernal chest pain and mild shortness of breath. He had associated nausea and vomiting this morning. He no longer has nausea or vomiting. He does have associated epigastric pain. He takes milrinone for his congestive heart failure. He gets this through a central line. He has not been able to take his milrinone since 11 AM on Monday morning due to being out of town. His cardiologist is Dr. Haroldine Laws.   The history is provided by the patient. No language interpreter was used.    Past Medical History  Diagnosis Date  . CHF (congestive heart failure)   . Sarcoidosis   . Cardiomyopathy, dilated, nonischemic     non ischemic by cath  . Acute on chronic systolic heart failure   . Automatic implantable cardiac defibrillator in situ   . Atrial fibrillation   . NSVT (nonsustained ventricular tachycardia)   . GERD (gastroesophageal reflux disease)   . Hypercholesteremia   . Myocardial infarction   . Shortness of breath   . Chronic kidney disease (CKD), stage III (moderate)   . Pacemaker   . Anginal pain   . Gout   . Hypertension     dr [name]  . Coronary artery disease   . Elevated PSA 06/24/2013   Past Surgical History  Procedure Laterality Date  . Back surgery  1987    Ruptured disk repair  . Pacemaker insertion      with ICD  .  Tee without cardioversion  01/17/2011    Procedure: TRANSESOPHAGEAL ECHOCARDIOGRAM (TEE);  Surgeon: Birdie Riddle, MD;  Location: [place];  Service: Cardiovascular;  Laterality: N/A;  . Cardioversion  01/17/2011    Procedure: CARDIOVERSION;  Surgeon: Birdie Riddle, MD;  Location: [place];  Service: Cardiovascular;  Laterality: N/A;  . Cardiac catheterization    . Insert / replace / remove pacemaker    . Anterior cervical decomp/discectomy fusion  08/21/2011    Procedure: ANTERIOR CERVICAL DECOMPRESSION/DISCECTOMY FUSION 2 LEVELS;  Surgeon: Marybelle Killings, MD;  Location: [place];  Service: Orthopedics;  Laterality: N/A;  C5-6, C6-7 Anterior Cervical Discectomy and Fusion, allograft, plate  . Tee without cardioversion N/A 02/16/2012    Procedure: TRANSESOPHAGEAL ECHOCARDIOGRAM (TEE);  Surgeon: Jolaine Artist, MD;  Location: [facility] ENDOSCOPY;  Service: Cardiovascular;  Laterality: N/A;  original case scheduled under his dad (who is deceased), rescheduled under correct mrn/pt/dob. [place]/dl  . Tee without cardioversion N/A 03/22/2012    Procedure: TRANSESOPHAGEAL ECHOCARDIOGRAM (TEE);  Surgeon: Lelon Perla, MD;  Location: [place];  Service: Cardiovascular;  Laterality: N/A;  . Cardioversion N/A 03/22/2012    Procedure: CARDIOVERSION;  Surgeon: Lelon Perla, MD;  Location: [facility] ENDOSCOPY;  Service: Cardiovascular;  Laterality: N/A;  . Cardioversion N/A 04/26/2012    Procedure: CARDIOVERSION;  Surgeon: Jolaine Artist, MD;  Location: [facility] ENDOSCOPY;  Service: Cardiovascular;  Laterality: N/A;  . Central venous catheter tunneled insertion single lumen  09/16/2012  right IJ  . Cardioversion N/A 11/08/2012    Procedure: CARDIOVERSION;  Surgeon: Jolaine Artist, MD;  Location: [facility] ENDOSCOPY;  Service: Cardiovascular;  Laterality: N/A;   Family History  Problem Relation Age of Onset  . Heart disease    . Heart failure    . Stroke    . Anesthesia problems Neg Hx   . Hypotension Neg Hx    . Malignant hyperthermia Neg Hx   . Pseudochol deficiency Neg Hx    History  Substance Use Topics  . Smoking status: Never Smoker   . Smokeless tobacco: Never Used  . Alcohol Use: No    Review of Systems  Constitutional: Negative for fever and chills.  Respiratory: Positive for shortness of breath.   Cardiovascular: Positive for chest pain.  Gastrointestinal: Positive for nausea and vomiting.  Neurological: Positive for weakness (generalized).  All other systems reviewed and are negative.     Allergies  Nitroglycerin  Home Medications   Prior to Admission medications   Medication Sig Start Date End Date Taking? Authorizing Provider  allopurinol (ZYLOPRIM) 100 MG tablet Take 1 tablet (100 mg total) by mouth daily. 02/05/12  Yes Wynetta Emery, PA-C  amiodarone (PACERONE) 200 MG tablet Take 200 mg by mouth 2 (two) times daily.   Yes Historical Provider, MD  apixaban (ELIQUIS) 5 MG TABS tablet Take 1 tablet (5 mg total) by mouth 2 (two) times daily. 10/09/12  Yes Rande Brunt, NP  carvedilol (COREG) 3.125 MG tablet Take 3.125 mg by mouth 2 (two) times daily with a meal.   Yes Historical Provider, MD  eplerenone (INSPRA) 25 MG tablet Take 1 tablet (25 mg total) by mouth daily. 07/01/13  Yes Rande Brunt, NP  fluticasone (FLONASE) 50 MCG/ACT nasal spray Place 2 sprays into both nostrils daily as needed for allergies.    Yes Historical Provider, MD  LORazepam (ATIVAN) 0.5 MG tablet Take 1-2 tablets (0.5-1 mg total) by mouth 2 (two) times daily as needed for anxiety. 05/19/13  Yes Debbrah Alar, NP  metolazone (ZAROXOLYN) 2.5 MG tablet Take 1 tablet (2.5 mg total) by mouth daily as needed (for fluid retention). 04/21/13  Yes Shaune Pascal Bensimhon, MD  milrinone [facility]) 20 MG/100ML SOLN infusion Inject 0.375 mcg/kg/min into the vein continuous. 440mg  in 433ml infusion 2.5 ml per hour   Yes Historical Provider, MD  Oxycodone HCl 20 MG TABS Take 1 tablet (20 mg total) by mouth 3  (three) times daily as needed. 05/19/13  Yes Debbrah Alar, NP  potassium chloride SA (K-DUR,KLOR-CON) 20 MEQ tablet Take 60 meq (3 tablets) in the morning and 40 meq (2 tablets) in the evening. 07/01/13  Yes Rande Brunt, NP  promethazine (PHENERGAN) 25 MG tablet Take 25 mg by mouth every 6 (six) hours as needed for nausea or vomiting.   Yes Historical Provider, MD  ranitidine (ZANTAC) 150 MG capsule Take 150 mg by mouth 2 (two) times daily.   Yes Historical Provider, MD  senna-docusate (SENOKOT-S) 8.6-50 MG per tablet Take 1-2 tablets by mouth 2 (two) times daily as needed for mild constipation or moderate constipation.   Yes Historical Provider, MD  torsemide (DEMADEX) 20 MG tablet Take 4 tablets (80 mg total) by mouth daily. 06/11/13  Yes Larey Dresser, MD  zolpidem (AMBIEN) 5 MG tablet Take 1 tablet (5 mg total) by mouth at bedtime as needed for sleep. 06/23/13  Yes Debbrah Alar, NP  oxyCODONE-acetaminophen (PERCOCET/ROXICET) 5-325 MG per tablet  05/15/13  Historical Provider, MD  spironolactone (ALDACTONE) 25 MG tablet  06/16/13   Historical Provider, MD   BP 108/78  Pulse 87  Temp(Src) 98.2 F (36.8 C) (Oral)  Resp 19  SpO2 96% Physical Exam  Nursing note and vitals reviewed. Constitutional: He is oriented to person, place, and time. He appears well-developed and well-nourished. No distress.  Chronically ill appearing  HENT:  Head: Normocephalic and atraumatic.  Right Ear: External ear normal.  Left Ear: External ear normal.  Nose: Nose normal.  Mouth/Throat: Oropharynx is clear and moist.  Eyes: Conjunctivae are normal.  Neck: Normal range of motion. Neck supple. No tracheal deviation present.  Cardiovascular: Normal rate, regular rhythm and normal heart sounds.   Pulmonary/Chest: Effort normal and breath sounds normal. No stridor.  Central line in place. No surrounding erythema.   Abdominal: Soft. He exhibits no distension. There is no tenderness.  Musculoskeletal:  Normal range of motion.  Neurological: He is alert and oriented to person, place, and time.  Skin: Skin is warm and dry. He is not diaphoretic.  Psychiatric: He has a normal mood and affect. His behavior is normal.    ED Course  Procedures (including critical care time) Labs Review Labs Reviewed  CBC WITH DIFFERENTIAL - Abnormal; Notable for the following:    Neutrophils Relative % 42 (*)    Monocytes Relative 16 (*)    All other components within normal limits  PRO B NATRIURETIC PEPTIDE - Abnormal; Notable for the following:    Pro B Natriuretic peptide (BNP) 3225.0 (*)    All other components within normal limits  COMPREHENSIVE METABOLIC PANEL - Abnormal; Notable for the following:    Potassium 3.2 (*)    BUN 28 (*)    Creatinine, Ser 2.18 (*)    Albumin 3.4 (*)    GFR calc non Af Amer 30 (*)    GFR calc Af Amer 35 (*)    Anion gap 17 (*)    All other components within normal limits  URINE CULTURE  LIPASE, BLOOD  URINALYSIS, ROUTINE W REFLEX MICROSCOPIC  I-STAT TROPOININ, ED    Imaging Review Dg Chest 2 View  08/06/2013   CLINICAL DATA:  Chest pain.  Shortness of breath.  EXAM: CHEST  2 VIEW  COMPARISON:  06/09/2013.  FINDINGS: Mediastinum hilar structures normal. Cardiomegaly. Cardiac pacer with lead tip in the right ventricle. Central line in stable position. Lungs are clear. No pleural effusion or pneumothorax. Cervical spine fusion.  IMPRESSION: 1. Central in stable position.  Cardiac pacer stent position. 2. Stable cardiomegaly, no CHF. 3. No acute pulmonary disease.  Chest is stable from 06/09/2013.   Electronically Signed   By: Marcello Moores  Register   On: 08/06/2013 12:26     EKG Interpretation   Date/Time:  Wednesday August 06 2013 11:21:46 EDT Ventricular Rate:  78 PR Interval:  208 QRS Duration: 118 QT Interval:  354 QTC Calculation: 403 R Axis:   -79 Text Interpretation:  Sinus rhythm with frequent Premature ventricular  complexes Left axis deviation Incomplete  left bundle branch block  Nonspecific T wave abnormality Abnormal ECG No significant change since  last tracing Confirmed by ZACKOWSKI  MD, SCOTT [phone]) on 08/06/2013 3:02:38  PM      MDM   Final diagnoses:  Acute on chronic congestive heart failure, unspecified congestive heart failure type  Hypokalemia    Patient presents to ED with generalized weakness, chest pain, and shortness of breath after being off milrinone since Monday. Dr. Haroldine Laws  evaluated patient and recommends discharge after 80mg  Lasix, 80 of potassium, and restarting milrinone. Patient will follow up with Dr. Haroldine Laws as an outpatient. Discussed reasons to return to ED immediately. Vital signs stable for discharge. Dr. Rogene Houston evaluated patient and agrees with plan. Patient / Family / Caregiver informed of clinical course, understand medical decision-making process, and agree with plan.     Elwyn Lade, PA-C 08/07/13 1031

## 2013-08-07 LAB — URINE CULTURE: Colony Count: 5000

## 2013-08-08 ENCOUNTER — Telehealth: Payer: Self-pay | Admitting: *Deleted

## 2013-08-08 MED ORDER — ZOLPIDEM TARTRATE 5 MG PO TABS: 5.0000 mg | ORAL_TABLET | Freq: Every evening | ORAL | Status: DC | PRN

## 2013-08-08 NOTE — Telephone Encounter (Signed)
Rx faxed

## 2013-08-08 NOTE — Telephone Encounter (Signed)
Pt left message requesting refill of zolpidem. Last Rx provided 06/23/13. Pt has f/u in 09/2013. Rx printed and forwarded to Provider for signature.

## 2013-08-08 NOTE — Telephone Encounter (Signed)
Notified pt. 

## 2013-08-09 ENCOUNTER — Other Ambulatory Visit (HOSPITAL_COMMUNITY): Payer: Self-pay | Admitting: Internal Medicine

## 2013-08-11 ENCOUNTER — Telehealth (HOSPITAL_COMMUNITY): Payer: Self-pay | Admitting: Vascular Surgery

## 2013-08-11 ENCOUNTER — Ambulatory Visit (INDEPENDENT_AMBULATORY_CARE_PROVIDER_SITE_OTHER): Payer: Medicare Other | Admitting: Family

## 2013-08-11 ENCOUNTER — Encounter: Payer: Self-pay | Admitting: Family

## 2013-08-11 VITALS — BP 100/80 | HR 88 | Temp 98.1°F | Resp 16 | Ht 75.0 in | Wt 242.1 lb

## 2013-08-11 DIAGNOSIS — R1013 Epigastric pain: Secondary | ICD-10-CM

## 2013-08-11 MED ORDER — OMEPRAZOLE-SODIUM BICARBONATE 40-1100 MG PO CAPS: 1.0000 | ORAL_CAPSULE | Freq: Every day | ORAL | Status: DC

## 2013-08-11 NOTE — Progress Notes (Signed)
Pre visit review using our clinic review tool, if applicable. No additional management support is needed unless otherwise documented below in the visit note. 

## 2013-08-11 NOTE — Progress Notes (Signed)
Subjective:    Patient ID: [name], male    DOB: 09-01-1950, 63 y.o.   MRN: [id number]  HPI  Mr. Jagiello is a 63 yr old male who presents today with chief complaint of abdominal pain. Pt reports abdominal pain and burning, nausea worse after eating x 1 year. Reports pain worsens with he eats. Pain is associated with vomiting. He has tried promethazine without improvement in his symptoms.   Pt had normal EGD 2012.  He had a limited abd [place] 2011.  He reports chest pain is chronic. He follows with pain management.  Failed omeprazole, nexium, protonix and zantac.  Review of Systems See HPI  Past Medical History  Diagnosis Date  . CHF (congestive heart failure)   . Sarcoidosis   . Cardiomyopathy, dilated, nonischemic     non ischemic by cath  . Acute on chronic systolic heart failure   . Automatic implantable cardiac defibrillator in situ   . Atrial fibrillation   . NSVT (nonsustained ventricular tachycardia)   . GERD (gastroesophageal reflux disease)   . Hypercholesteremia   . Myocardial infarction   . Shortness of breath   . Chronic kidney disease (CKD), stage III (moderate)   . Pacemaker   . Anginal pain   . Gout   . Hypertension     dr [name]  . Coronary artery disease   . Elevated PSA 06/24/2013    History   Social History  . Marital Status: Single    Spouse Name: N/A    Number of Children: N/A  . Years of Education: N/A   Occupational History  . Not on file.   Social History Main Topics  . Smoking status: Never Smoker   . Smokeless tobacco: Never Used  . Alcohol Use: No  . Drug Use: Yes    Special: "Crack" cocaine, Marijuana, Cocaine     Comment: last use Novemenber  . Sexual Activity: Not Currently   Other Topics Concern  . Not on file   Social History Narrative   3 sons- one in [place], 1 in [place], on in [place]   Retired from post office- on disability   Completed Bachelors from [employer]    Past Surgical History  Procedure Laterality Date  . Back  surgery  1987    Ruptured disk repair  . Pacemaker insertion      with ICD  . Tee without cardioversion  01/17/2011    Procedure: TRANSESOPHAGEAL ECHOCARDIOGRAM (TEE);  Surgeon: Birdie Riddle, MD;  Location: [place];  Service: Cardiovascular;  Laterality: N/A;  . Cardioversion  01/17/2011    Procedure: CARDIOVERSION;  Surgeon: Birdie Riddle, MD;  Location: [place];  Service: Cardiovascular;  Laterality: N/A;  . Cardiac catheterization    . Insert / replace / remove pacemaker    . Anterior cervical decomp/discectomy fusion  08/21/2011    Procedure: ANTERIOR CERVICAL DECOMPRESSION/DISCECTOMY FUSION 2 LEVELS;  Surgeon: Marybelle Killings, MD;  Location: [place];  Service: Orthopedics;  Laterality: N/A;  C5-6, C6-7 Anterior Cervical Discectomy and Fusion, allograft, plate  . Tee without cardioversion N/A 02/16/2012    Procedure: TRANSESOPHAGEAL ECHOCARDIOGRAM (TEE);  Surgeon: Jolaine Artist, MD;  Location: [facility] ENDOSCOPY;  Service: Cardiovascular;  Laterality: N/A;  original case scheduled under his dad (who is deceased), rescheduled under correct mrn/pt/dob. /dl  . Tee without cardioversion N/A 03/22/2012    Procedure: TRANSESOPHAGEAL ECHOCARDIOGRAM (TEE);  Surgeon: Lelon Perla, MD;  Location: [place];  Service: Cardiovascular;  Laterality: N/A;  . Cardioversion N/A 03/22/2012    Procedure: CARDIOVERSION;  Surgeon: Lelon Perla, MD;  Location: [facility] ENDOSCOPY;  Service: Cardiovascular;  Laterality: N/A;  . Cardioversion N/A 04/26/2012    Procedure: CARDIOVERSION;  Surgeon: Jolaine Artist, MD;  Location: [facility] ENDOSCOPY;  Service: Cardiovascular;  Laterality: N/A;  . Central venous catheter tunneled insertion single lumen  09/16/2012    right IJ  . Cardioversion N/A 11/08/2012    Procedure: CARDIOVERSION;  Surgeon: Jolaine Artist, MD;  Location: [facility] ENDOSCOPY;  Service: Cardiovascular;  Laterality: N/A;    Family History  Problem Relation Age of Onset  . Heart disease    .  Heart failure    . Stroke    . Anesthesia problems Neg Hx   . Hypotension Neg Hx   . Malignant hyperthermia Neg Hx   . Pseudochol deficiency Neg Hx     Allergies  Allergen Reactions  . Nitroglycerin Other (See Comments)    "feels like head is going to bust open"    Current Outpatient Prescriptions on File Prior to Visit  Medication Sig Dispense Refill  . allopurinol (ZYLOPRIM) 100 MG tablet Take 1 tablet (100 mg total) by mouth daily.  30 tablet  6  . amiodarone (PACERONE) 200 MG tablet Take 200 mg by mouth 2 (two) times daily.      Marland Kitchen apixaban (ELIQUIS) 5 MG TABS tablet Take 1 tablet (5 mg total) by mouth 2 (two) times daily.  60 tablet  4  . carvedilol (COREG) 3.125 MG tablet Take 3.125 mg by mouth 2 (two) times daily with a meal.      . eplerenone (INSPRA) 25 MG tablet Take 1 tablet (25 mg total) by mouth daily.  30 tablet  3  . fluticasone (FLONASE) 50 MCG/ACT nasal spray Place 2 sprays into both nostrils daily as needed for allergies.       Marland Kitchen LORazepam (ATIVAN) 0.5 MG tablet Take 1-2 tablets (0.5-1 mg total) by mouth 2 (two) times daily as needed for anxiety.  50 tablet  0  . metolazone (ZAROXOLYN) 2.5 MG tablet Take 1 tablet (2.5 mg total) by mouth daily as needed (for fluid retention).  10 tablet  1  . milrinone (PRIMACOR) 20 MG/100ML SOLN infusion Inject 0.375 mcg/kg/min into the vein continuous. 440mg  in 415ml infusion 2.5 ml per hour      . Oxycodone HCl 20 MG TABS Take 1 tablet (20 mg total) by mouth 3 (three) times daily as needed.  90 tablet  0  . oxyCODONE-acetaminophen (PERCOCET/ROXICET) 5-325 MG per tablet       . potassium chloride SA (K-DUR,KLOR-CON) 20 MEQ tablet Take 60 meq (3 tablets) in the morning and 40 meq (2 tablets) in the evening.  150 tablet  3  . promethazine (PHENERGAN) 25 MG tablet Take 25 mg by mouth every 6 (six) hours as needed for nausea or vomiting.      . ranitidine (ZANTAC) 150 MG capsule Take 150 mg by mouth 2 (two) times daily.      Marland Kitchen  senna-docusate (SENOKOT-S) 8.6-50 MG per tablet Take 1-2 tablets by mouth 2 (two) times daily as needed for mild constipation or moderate constipation.      Marland Kitchen spironolactone (ALDACTONE) 25 MG tablet       . torsemide (DEMADEX) 20 MG tablet Take 4 tablets (80 mg total) by mouth daily.  120 tablet  3  . zolpidem (AMBIEN) 5 MG tablet Take 1 tablet (5 mg total) by mouth  at bedtime as needed for sleep.  30 tablet  0  . NEXIUM 40 MG capsule TAKE 1 CAPSULE EVERY DAY  30 capsule  3   No current facility-administered medications on file prior to visit.    BP 100/80  Pulse 88  Temp(Src) 98.1 F (36.7 C) (Oral)  Resp 16  Ht 6\' 3"  (1.905 m)  Wt 242 lb 1.3 oz (109.807 kg)  BMI 30.26 kg/m2  SpO2 99%       Objective:   Physical Exam  Constitutional: He is oriented to person, place, and time. He appears well-developed and well-nourished. No distress.  HENT:  Head: Normocephalic and atraumatic.  Cardiovascular: Normal rate and regular rhythm.   No murmur heard. Pulmonary/Chest: Effort normal and breath sounds normal. No respiratory distress. He has no wheezes. He has no rales. He exhibits no tenderness.  Abdominal: Soft. Bowel sounds are normal. There is tenderness in the epigastric area.  Neurological: He is alert and oriented to person, place, and time.  Psychiatric: He has a normal mood and affect. His behavior is normal. Judgment and thought content normal.          Assessment & Plan:

## 2013-08-11 NOTE — Patient Instructions (Signed)
Start Zegerid. Return tomorrow for lab work and abdominal ultrasound. You will be contacted about your referral to the gastroenterologist. Call if worsening abdominal pain. Go to ER if you are unable to keep down food/liquid.

## 2013-08-12 ENCOUNTER — Telehealth: Payer: Self-pay | Admitting: Family

## 2013-08-12 ENCOUNTER — Ambulatory Visit (HOSPITAL_BASED_OUTPATIENT_CLINIC_OR_DEPARTMENT_OTHER)
Admission: RE | Admit: 2013-08-12 | Discharge: 2013-08-12 | Disposition: A | Discharge status: Home / Self Care | Payer: Medicare Other | Source: Ambulatory Visit | Attending: Family | Admitting: Family

## 2013-08-12 DIAGNOSIS — N281 Cyst of kidney, acquired: Secondary | ICD-10-CM | POA: Diagnosis not present

## 2013-08-12 DIAGNOSIS — R1013 Epigastric pain: Secondary | ICD-10-CM | POA: Insufficient documentation

## 2013-08-12 DIAGNOSIS — K838 Other specified diseases of biliary tract: Secondary | ICD-10-CM | POA: Diagnosis not present

## 2013-08-12 DIAGNOSIS — R109 Unspecified abdominal pain: Secondary | ICD-10-CM

## 2013-08-12 MED ORDER — SUCRALFATE 1 GM/10ML PO SUSP: 1.0000 g | Freq: Two times a day (BID) | ORAL | Status: DC

## 2013-08-12 MED ORDER — ESOMEPRAZOLE MAGNESIUM 40 MG PO CPDR: 40.0000 mg | DELAYED_RELEASE_CAPSULE | Freq: Two times a day (BID) | ORAL | Status: DC

## 2013-08-12 NOTE — Telephone Encounter (Signed)
Try Carafate, + nexium twice daily. I would also like him to complete an ultrasound to check his aorta and make sure that this is not contributing to his abdominal pain.

## 2013-08-12 NOTE — Telephone Encounter (Signed)
Insurance will not cover omeprazole, do you suggest a new med?

## 2013-08-13 ENCOUNTER — Other Ambulatory Visit (HOSPITAL_COMMUNITY): Payer: Self-pay | Admitting: Internal Medicine

## 2013-08-13 ENCOUNTER — Telehealth: Payer: Self-pay | Admitting: *Deleted

## 2013-08-13 NOTE — Telephone Encounter (Signed)
Received call from [place], [place] with [facility] stating she was seeing the pt today and would obtain labs from pt's PICC line since venipuncture in our office on 08/12/13 was unsuccessful. They will send tests to [place].

## 2013-08-13 NOTE — Telephone Encounter (Signed)
Notified pt and he is agreeable to proceed with u/s and medications as recommended below.

## 2013-08-14 LAB — CBC WITH DIFFERENTIAL
BASOS ABS: 0 10*3/uL (ref 0.0–0.2)
Basos: 1 %
Eos: 6 %
Eosinophils Absolute: 0.3 10*3/uL (ref 0.0–0.4)
HCT: 41.6 % (ref 37.5–51.0)
Hemoglobin: 15 g/dL (ref 12.6–17.7)
IMMATURE GRANULOCYTES: 0 %
Immature Grans (Abs): 0 10*3/uL (ref 0.0–0.1)
LYMPHS ABS: 2.1 10*3/uL (ref 0.7–3.1)
Lymphs: 38 %
MCH: 30.1 pg (ref 26.6–33.0)
MCHC: 36.1 g/dL — ABNORMAL HIGH (ref 31.5–35.7)
MCV: 84 fL (ref 79–97)
Monocytes Absolute: 0.8 10*3/uL (ref 0.1–0.9)
Monocytes: 15 %
Neutrophils Absolute: 2.2 10*3/uL (ref 1.4–7.0)
Neutrophils Relative %: 40 %
PLATELETS: 193 10*3/uL (ref 150–379)
RBC: 4.98 x10E6/uL (ref 4.14–5.80)
RDW: 14.4 % (ref 12.3–15.4)
WBC: 5.5 10*3/uL (ref 3.4–10.8)

## 2013-08-14 LAB — BASIC METABOLIC PANEL
BUN/Creatinine Ratio: 21 (ref 10–22)
BUN: 59 mg/dL — ABNORMAL HIGH (ref 8–27)
CO2: 31 mmol/L — AB (ref 18–29)
Calcium: 10.3 mg/dL — ABNORMAL HIGH (ref 8.6–10.2)
Chloride: 90 mmol/L — ABNORMAL LOW (ref 97–108)
Creatinine, Ser: 2.84 mg/dL — ABNORMAL HIGH (ref 0.76–1.27)
GFR calc non Af Amer: 23 mL/min/{1.73_m2} — ABNORMAL LOW (ref >59)
GFR, EST AFRICAN AMERICAN: 26 mL/min/{1.73_m2} — AB (ref >59)
Glucose: 83 mg/dL (ref 65–99)
Potassium: 2.7 mmol/L — ABNORMAL LOW (ref 3.5–5.2)
Sodium: 138 mmol/L (ref 134–144)

## 2013-08-14 LAB — HEPATIC FUNCTION PANEL
ALK PHOS: 89 IU/L (ref 39–117)
ALT: 23 IU/L (ref 0–44)
AST: 37 IU/L (ref 0–40)
Albumin: 4.2 g/dL (ref 3.6–4.8)
Bilirubin, Direct: 0.18 mg/dL (ref 0.00–0.40)
Total Bilirubin: 0.5 mg/dL (ref 0.0–1.2)
Total Protein: 8.3 g/dL (ref 6.0–8.5)

## 2013-08-14 LAB — LIPASE: Lipase: 60 U/L — ABNORMAL HIGH (ref 0–59)

## 2013-08-14 LAB — PLEASE NOTE

## 2013-08-14 LAB — MAGNESIUM: Magnesium: 2.1 mg/dL (ref 1.6–2.6)

## 2013-08-14 NOTE — Assessment & Plan Note (Signed)
See phone note 8/11-  Try Carafate, + nexium twice daily. I would also like him to complete an ultrasound to check his aorta and make sure that this is not contributing to his abdominal pain.       Consider GI referral if above work up is unrevealing. Also advised him to complete h. Pylori, lft, lipase.

## 2013-08-15 ENCOUNTER — Ambulatory Visit (HOSPITAL_BASED_OUTPATIENT_CLINIC_OR_DEPARTMENT_OTHER): Admission: RE | Admit: 2013-08-15 | Payer: Medicare Other | Source: Ambulatory Visit

## 2013-08-15 ENCOUNTER — Other Ambulatory Visit: Payer: Self-pay | Admitting: Family

## 2013-08-15 DIAGNOSIS — R748 Abnormal levels of other serum enzymes: Secondary | ICD-10-CM

## 2013-08-15 LAB — H. PYLORI ANTIBODY, IGG: H Pylori IgG: 0.9 U/mL — ABNORMAL HIGH (ref 0.0–0.8)

## 2013-08-15 NOTE — Telephone Encounter (Signed)
Advised pt that due to potential drug interactions with his amiodarone and drug combos for h. Pylori, will refer to gi for further eval and recommendations.

## 2013-08-15 NOTE — Telephone Encounter (Signed)
Reviewed lab work from [place].  Note is made of mild elevation of h. Pylori and lipase.  He could have mild pancreatitis.  I would recommend that he repeat lipase in 1 week- contact us if worsening abdominal pain. Also, I would recommend that we treat him with abx as below for possible H. Pylori infection which may explain his abdominal discomfort. Continue bid nexium dosing.

## 2013-08-15 NOTE — Telephone Encounter (Signed)
Notified pt and he voices understanding. Pt wants to complete Lipase through home health nurse like he did last week. Order will be sent to [facility]. Pt will pick up order on Monday. Please advise re: possible drug-drug interactions. Per verbal from Provider, she will review meds at home and make necessary changes.

## 2013-08-18 ENCOUNTER — Ambulatory Visit (INDEPENDENT_AMBULATORY_CARE_PROVIDER_SITE_OTHER): Payer: Medicare Other | Admitting: Internal Medicine

## 2013-08-18 ENCOUNTER — Encounter: Payer: PRIVATE HEALTH INSURANCE | Admitting: Internal Medicine

## 2013-08-18 ENCOUNTER — Telehealth: Payer: Self-pay

## 2013-08-18 ENCOUNTER — Encounter: Payer: Self-pay | Admitting: Internal Medicine

## 2013-08-18 VITALS — BP 100/70 | HR 61 | Ht 75.0 in | Wt 246.0 lb

## 2013-08-18 DIAGNOSIS — I472 Ventricular tachycardia: Secondary | ICD-10-CM

## 2013-08-18 DIAGNOSIS — I428 Other cardiomyopathies: Secondary | ICD-10-CM

## 2013-08-18 DIAGNOSIS — I5022 Chronic systolic (congestive) heart failure: Secondary | ICD-10-CM

## 2013-08-18 DIAGNOSIS — I4729 Other ventricular tachycardia: Secondary | ICD-10-CM

## 2013-08-18 DIAGNOSIS — I509 Heart failure, unspecified: Secondary | ICD-10-CM

## 2013-08-18 DIAGNOSIS — I480 Paroxysmal atrial fibrillation: Secondary | ICD-10-CM

## 2013-08-18 DIAGNOSIS — I5023 Acute on chronic systolic (congestive) heart failure: Secondary | ICD-10-CM

## 2013-08-18 DIAGNOSIS — I420 Dilated cardiomyopathy: Secondary | ICD-10-CM

## 2013-08-18 DIAGNOSIS — Z95810 Presence of automatic (implantable) cardiac defibrillator: Secondary | ICD-10-CM

## 2013-08-18 DIAGNOSIS — I4891 Unspecified atrial fibrillation: Secondary | ICD-10-CM

## 2013-08-18 LAB — MDC_IDC_ENUM_SESS_TYPE_INCLINIC
Battery Remaining Longevity: 16.8 mo
Battery Voltage: 2.54 V
Brady Statistic RV Percent Paced: 0.18 %
Date Time Interrogation Session: 20150817111940
HighPow Impedance: 51.538
Implantable Pulse Generator Serial Number: 521670
Lead Channel Pacing Threshold Pulse Width: 0.6 ms
Lead Channel Pacing Threshold Pulse Width: 0.6 ms
Lead Channel Setting Pacing Amplitude: 2.5 V
Lead Channel Setting Sensing Sensitivity: 0.3 mV
MDC IDC MSMT LEADCHNL RV IMPEDANCE VALUE: 550 Ohm
MDC IDC MSMT LEADCHNL RV PACING THRESHOLD AMPLITUDE: 1.25 V
MDC IDC MSMT LEADCHNL RV PACING THRESHOLD AMPLITUDE: 1.25 V
MDC IDC MSMT LEADCHNL RV SENSING INTR AMPL: 11.7 mV
MDC IDC SET LEADCHNL RV PACING PULSEWIDTH: 0.6 ms
MDC IDC SET ZONE DETECTION INTERVAL: 250 ms
Zone Setting Detection Interval: 300 ms

## 2013-08-18 NOTE — Assessment & Plan Note (Signed)
His symptoms are class III. He will continue his current medical therapy, and followup with Dr. Jeffie Pollock.

## 2013-08-18 NOTE — Assessment & Plan Note (Signed)
His [employer] ICD is working normally. We'll plan to recheck in several months. He has just over a year of battery longevity.

## 2013-08-18 NOTE — Patient Instructions (Addendum)
Your physician recommends that you schedule a follow-up appointment in: 3 MONTHS with [facility] ([facility] ICD)  Your physician wants you to follow-up in: 1 YEAR with Dr Lovena Le.  You will receive a reminder letter in the mail two months in advance. If you don't receive a letter, please call our office to schedule the follow-up appointment.  Your physician recommends that you continue on your current medications as directed. Please refer to the Current Medication list given to you today.

## 2013-08-18 NOTE — Telephone Encounter (Signed)
We have no way of knowing what Lab Orders Melissa [PCP] wants to have [facility] draw as the only orders pending from 08.10.15 were drawn on 08.12.15 at Dr. Clayborne Dana order, so unfortunately you will have to call patient back and inform him that Melissa did not place any further orders and will not be back in the office until Monday, 08.24.15/SLS Thanks.

## 2013-08-18 NOTE — Assessment & Plan Note (Signed)
Since last interrogation, he had a single episode of ventricular fibrillation. At this point, he will continue amiodarone therapy. He is a large man, and now be inclined to continue 40 mg daily at least for several months.

## 2013-08-18 NOTE — Progress Notes (Signed)
HPI [name] returns today for followup. He is a very pleasant 62 year old man with a long-standing dilated cardiomyopathy, chronic systolic heart failure, ventricular tachycardia, status post ICD implantation. He also is a history of paroxysmal atrial arrhythmias which have been controlled on amiodarone. The patient has been on chronic inotropic support for several months secondary to worsening congestive heart failure which is class III despite intravenous milrinone. Approximately 2 weeks ago, he received an ICD shock while he was sleeping. His fiance observed the episode and noted seizure-like activity. Subsequent interrogation of his ICD demonstrated ventricular fibrillation. He admits to some medical noncompliance. Allergies  Allergen Reactions  . Nitroglycerin Other (See Comments)    "feels like head is going to bust open"     Current Outpatient Prescriptions  Medication Sig Dispense Refill  . allopurinol (ZYLOPRIM) 100 MG tablet Take 1 tablet (100 mg total) by mouth daily.  30 tablet  6  . amiodarone (PACERONE) 200 MG tablet Take 200 mg by mouth 2 (two) times daily.      Marland Kitchen apixaban (ELIQUIS) 5 MG TABS tablet Take 1 tablet (5 mg total) by mouth 2 (two) times daily.  60 tablet  4  . carvedilol (COREG) 3.125 MG tablet Take 3.125 mg by mouth 2 (two) times daily with a meal.      . eplerenone (INSPRA) 25 MG tablet Take 1 tablet (25 mg total) by mouth daily.  30 tablet  3  . esomeprazole (NEXIUM) 40 MG capsule Take 1 capsule (40 mg total) by mouth 2 (two) times daily before a meal.  60 capsule  1  . fluticasone (FLONASE) 50 MCG/ACT nasal spray Place 2 sprays into both nostrils daily as needed for allergies.       Marland Kitchen LORazepam (ATIVAN) 0.5 MG tablet Take 1-2 tablets (0.5-1 mg total) by mouth 2 (two) times daily as needed for anxiety.  50 tablet  0  . metolazone (ZAROXOLYN) 2.5 MG tablet Take 1 tablet (2.5 mg total) by mouth daily as needed (for fluid retention).  10 tablet  1  . milrinone  (PRIMACOR) 20 MG/100ML SOLN infusion Inject 0.375 mcg/kg/min into the vein continuous. 440mg  in 438ml infusion 2.5 ml per hour      . NEXIUM 40 MG capsule TAKE 1 CAPSULE EVERY DAY  30 capsule  3  . Oxycodone HCl 20 MG TABS Take 1 tablet (20 mg total) by mouth 3 (three) times daily as needed.  90 tablet  0  . potassium chloride SA (K-DUR,KLOR-CON) 20 MEQ tablet Take 60 meq (3 tablets) in the morning and 40 meq (2 tablets) in the evening.  150 tablet  3  . promethazine (PHENERGAN) 25 MG tablet Take 25 mg by mouth every 6 (six) hours as needed for nausea or vomiting.      . ranitidine (ZANTAC) 150 MG capsule Take 150 mg by mouth 2 (two) times daily.      Marland Kitchen senna-docusate (SENOKOT-S) 8.6-50 MG per tablet Take 1-2 tablets by mouth 2 (two) times daily as needed for mild constipation or moderate constipation.      Marland Kitchen spironolactone (ALDACTONE) 25 MG tablet       . torsemide (DEMADEX) 20 MG tablet Take by mouth daily. 2 in the ma and 3 in the pm      . zolpidem (AMBIEN) 5 MG tablet Take 1 tablet (5 mg total) by mouth at bedtime as needed for sleep.  30 tablet  0   No current facility-administered medications for this visit.  Past Medical History  Diagnosis Date  . CHF (congestive heart failure)   . Sarcoidosis   . Cardiomyopathy, dilated, nonischemic     non ischemic by cath  . Acute on chronic systolic heart failure   . Automatic implantable cardiac defibrillator in situ   . Atrial fibrillation   . NSVT (nonsustained ventricular tachycardia)   . GERD (gastroesophageal reflux disease)   . Hypercholesteremia   . Myocardial infarction   . Shortness of breath   . Chronic kidney disease (CKD), stage III (moderate)   . Pacemaker   . Anginal pain   . Gout   . Hypertension     dr [name]  . Coronary artery disease   . Elevated PSA 06/24/2013    ROS:   All systems reviewed and negative except as noted in the HPI.   Past Surgical History  Procedure Laterality Date  . Back surgery   1987    Ruptured disk repair  . Pacemaker insertion      with ICD  . Tee without cardioversion  01/17/2011    Procedure: TRANSESOPHAGEAL ECHOCARDIOGRAM (TEE);  Surgeon: Birdie Riddle, MD;  Location: [place];  Service: Cardiovascular;  Laterality: N/A;  . Cardioversion  01/17/2011    Procedure: CARDIOVERSION;  Surgeon: Birdie Riddle, MD;  Location: [place];  Service: Cardiovascular;  Laterality: N/A;  . Cardiac catheterization    . Insert / replace / remove pacemaker    . Anterior cervical decomp/discectomy fusion  08/21/2011    Procedure: ANTERIOR CERVICAL DECOMPRESSION/DISCECTOMY FUSION 2 LEVELS;  Surgeon: Marybelle Killings, MD;  Location: [place];  Service: Orthopedics;  Laterality: N/A;  C5-6, C6-7 Anterior Cervical Discectomy and Fusion, allograft, plate  . Tee without cardioversion N/A 02/16/2012    Procedure: TRANSESOPHAGEAL ECHOCARDIOGRAM (TEE);  Surgeon: Jolaine Artist, MD;  Location: [facility] ENDOSCOPY;  Service: Cardiovascular;  Laterality: N/A;  original case scheduled under his dad (who is deceased), rescheduled under correct mrn/pt/dob. [place]/dl  . Tee without cardioversion N/A 03/22/2012    Procedure: TRANSESOPHAGEAL ECHOCARDIOGRAM (TEE);  Surgeon: Lelon Perla, MD;  Location: [place];  Service: Cardiovascular;  Laterality: N/A;  . Cardioversion N/A 03/22/2012    Procedure: CARDIOVERSION;  Surgeon: Lelon Perla, MD;  Location: [facility] ENDOSCOPY;  Service: Cardiovascular;  Laterality: N/A;  . Cardioversion N/A 04/26/2012    Procedure: CARDIOVERSION;  Surgeon: Jolaine Artist, MD;  Location: [facility] ENDOSCOPY;  Service: Cardiovascular;  Laterality: N/A;  . Central venous catheter tunneled insertion single lumen  09/16/2012    right IJ  . Cardioversion N/A 11/08/2012    Procedure: CARDIOVERSION;  Surgeon: Jolaine Artist, MD;  Location: [facility] ENDOSCOPY;  Service: Cardiovascular;  Laterality: N/A;     Family History  Problem Relation Age of Onset  . Heart disease    . Heart  failure    . Stroke    . Anesthesia problems Neg Hx   . Hypotension Neg Hx   . Malignant hyperthermia Neg Hx   . Pseudochol deficiency Neg Hx      History   Social History  . Marital Status: Single    Spouse Name: N/A    Number of Children: N/A  . Years of Education: N/A   Occupational History  . Not on file.   Social History Main Topics  . Smoking status: Never Smoker   . Smokeless tobacco: Never Used  . Alcohol Use: No  . Drug Use: Yes    Special: "Crack" cocaine, Marijuana, Cocaine  Comment: last use Novemenber  . Sexual Activity: Not Currently   Other Topics Concern  . Not on file   Social History Narrative   3 sons- one in [place], 1 in [place], on in [place]   Retired from post office- on disability   Completed Bachelors from [employer]     BP 100/70  Pulse 61  Ht 6\' 3"  (1.905 m)  Wt 246 lb (111.585 kg)  BMI 30.75 kg/m2  Physical Exam:  Well appearing middle-aged man, NAD HEENT: Unremarkable Neck:  7 cm JVD, no thyromegally Lungs:  Clear with no wheezes, rales, or rhonchi. HEART:  Regular rate rhythm, no murmurs, no rubs, no clicks Abd:  soft, positive bowel sounds, no organomegally, no rebound, no guarding Ext:  2 plus pulses, no edema, no cyanosis, no clubbing Skin:  No rashes no nodules Neuro:  CN II through XII intact, motor grossly intact   DEVICE  Normal device function.  See PaceArt for details.   Assess/Plan:

## 2013-08-18 NOTE — Assessment & Plan Note (Signed)
His ventricular rate appears to be well-controlled and maintaining sinus rhythm. He will continue amiodarone therapy.

## 2013-08-18 NOTE — Telephone Encounter (Signed)
Pt stated that "he wanted someone to know that he will be here tomorrow to pick up orders to have [facility] drawn through [facility]." LDM

## 2013-08-19 NOTE — Telephone Encounter (Signed)
Orders were up front, pt called and aware. LDM

## 2013-08-20 ENCOUNTER — Telehealth: Payer: Self-pay

## 2013-08-20 NOTE — Telephone Encounter (Signed)
Referral was placed by Melissa.  According to referral the GI office tried to contact patient a few times.  Finally reached the wife and left a message with her for patient to call them back to set up appointment.  They need to call [facility].

## 2013-08-20 NOTE — Telephone Encounter (Signed)
[name] stated that "she was wanting to know if the dr. Arville Go the referral to Gastroenterologist yet?"LDM

## 2013-08-21 ENCOUNTER — Encounter: Payer: Self-pay | Admitting: Internal Medicine

## 2013-08-21 ENCOUNTER — Telehealth: Payer: Self-pay

## 2013-08-21 NOTE — Telephone Encounter (Signed)
Spoke with pt. He's aware and is going to set up appt with GI today. LDM

## 2013-08-21 NOTE — Telephone Encounter (Signed)
Patient returned phone call. Informed him of this.

## 2013-08-22 ENCOUNTER — Telehealth: Payer: Self-pay

## 2013-08-22 NOTE — Telephone Encounter (Signed)
[name] ([facility]) stated that "pt cant get an appt to GI until October. Tha's not going to work because pt is in too much pain. Can Melissa do anything to move appt up? Also can pt be seen at infectious disease office to see if they can get antibiotic for stomach."LDM

## 2013-08-23 NOTE — Telephone Encounter (Signed)
I would not recommend ID consult at this time. Will defer management to GI.  I will see if we can move up his appointment.  Anderson Malta, could you please see if one of the PA's/NP's in GI can see him sooner? Thanks.

## 2013-08-25 IMAGING — CT CT CHEST W/O CM
2 of 4 series · 15 of 36 positions shown, 18 images · non-contrast
Comparison: 02/18/2012

CLINICAL DATA: Shortness of breath, pre LVAD placement

CT CHEST WITHOUT CONTRAST
TECHNIQUE: Multidetector CT imaging of the chest was performed
following the standard protocol without IV contrast.

[Series 2: routine chest 5.0 st · axial · 0.61mm/px · z∈[-324,-54]mm · 12 of 64 slices shown, 15 images]
[im 5/64  mediastinal]
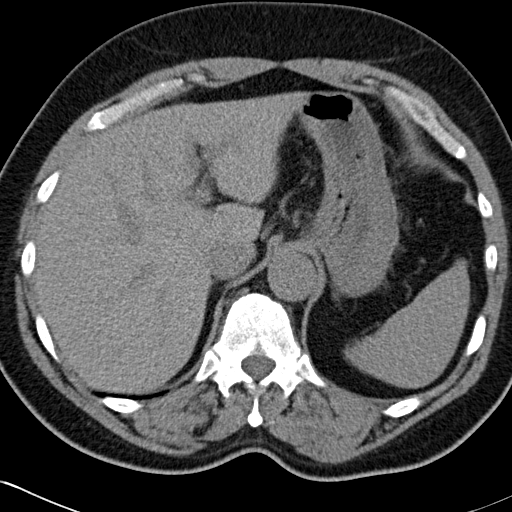
[im 5/64  lung]
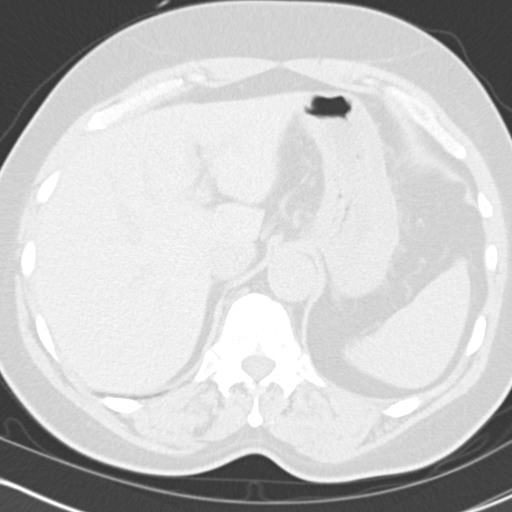
[im 10/64  lung]
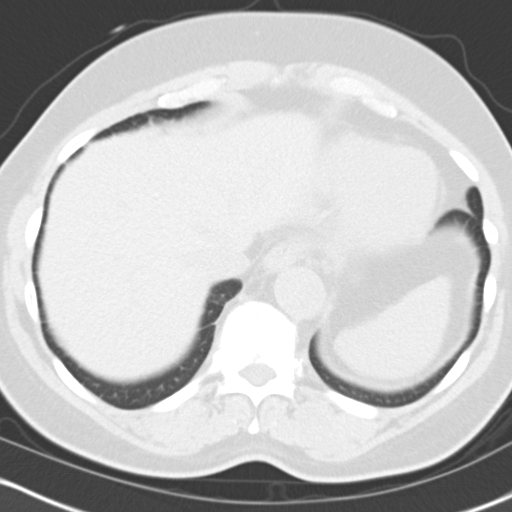
[im 15/64  lung]
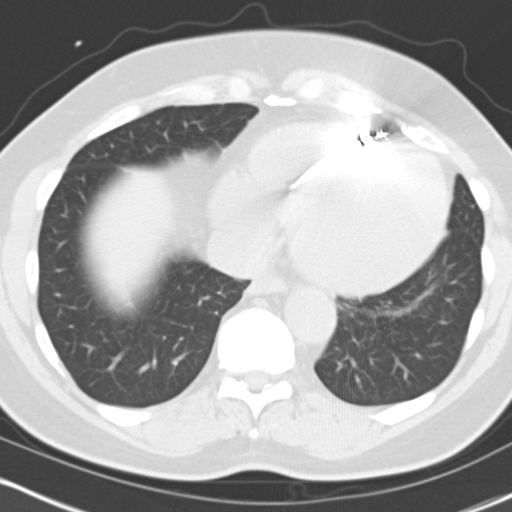
[im 20/64  lung]
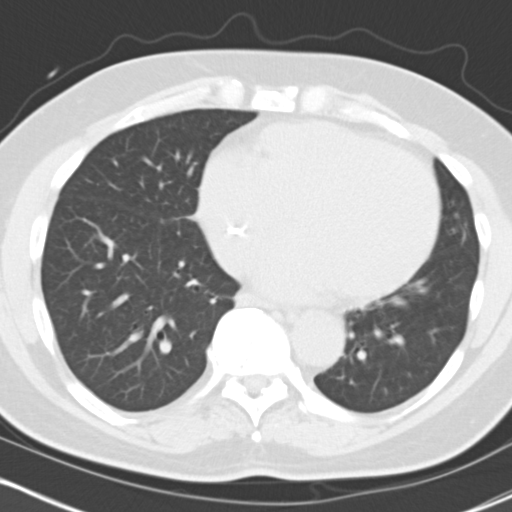
[im 25/64  mediastinal]
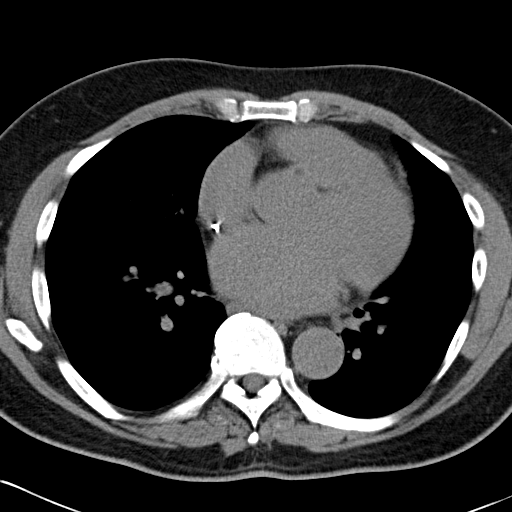
[im 25/64  lung]
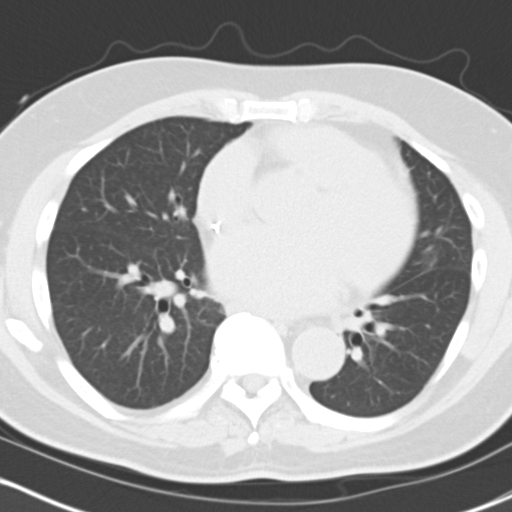
[im 30/64  lung]
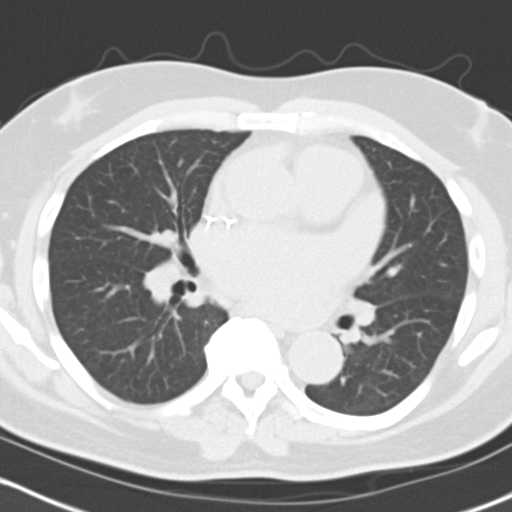
[im 34/64  lung]
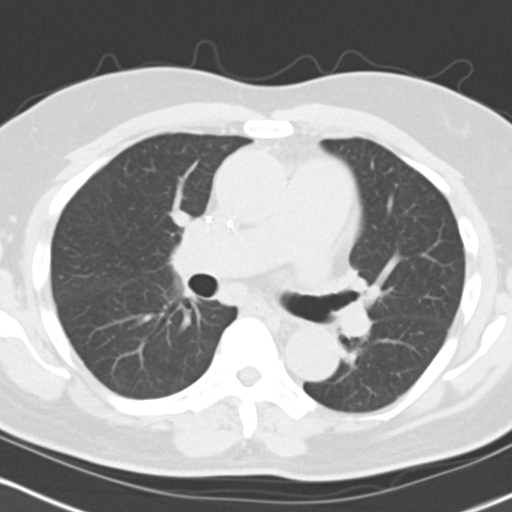
[im 39/64  lung]
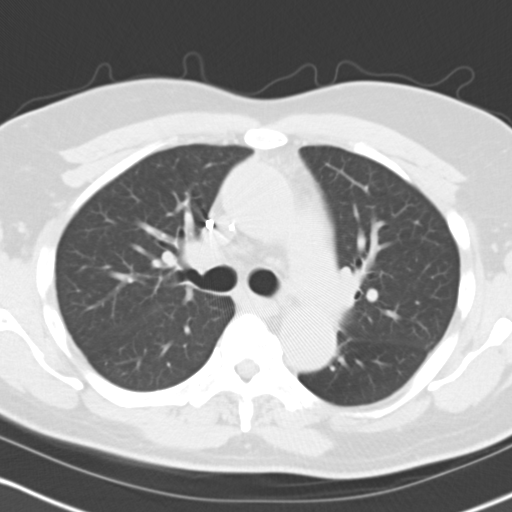
[im 44/64  mediastinal]
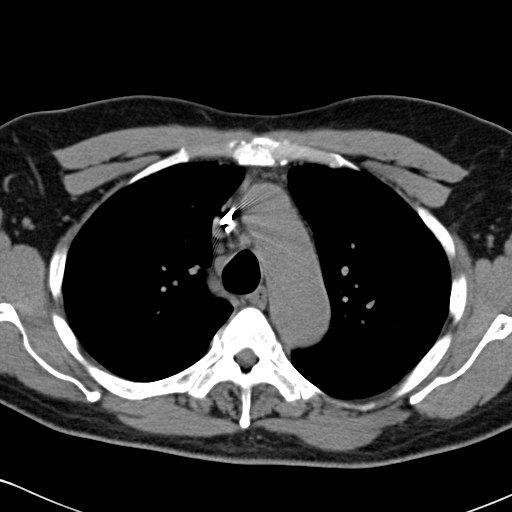
[im 44/64  lung]
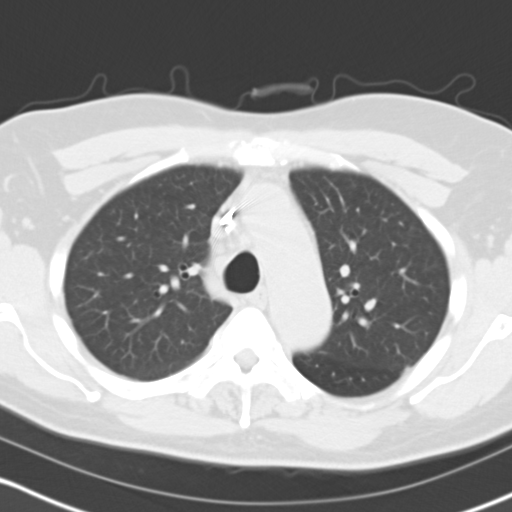
[im 49/64  lung]
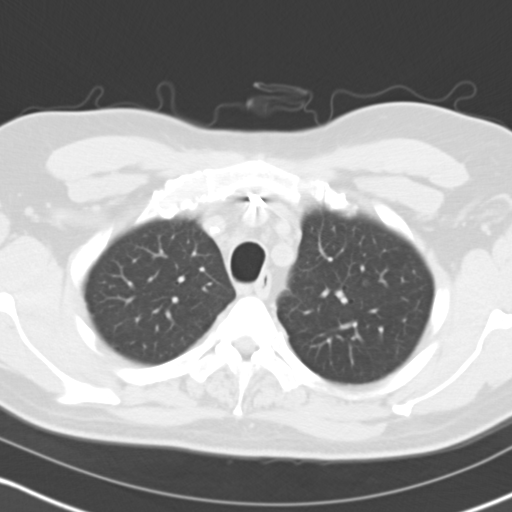
[im 54/64  lung]
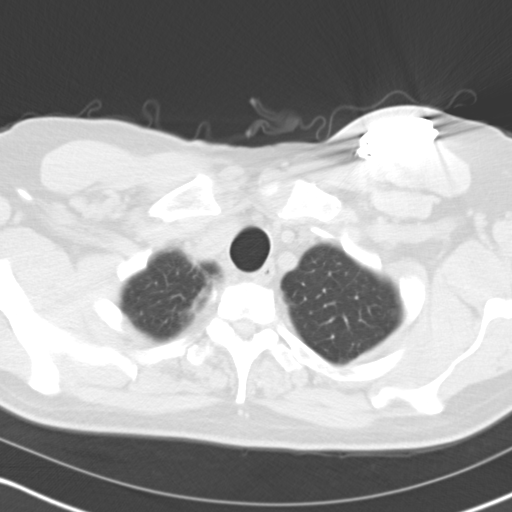
[im 59/64  lung]
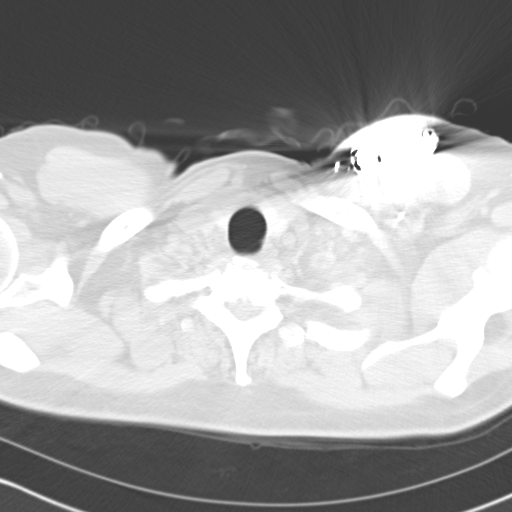

[Series 5: coronals · coronal · 0.65mm/px · 3 of 109 slices shown]
[im 22/109  lung]
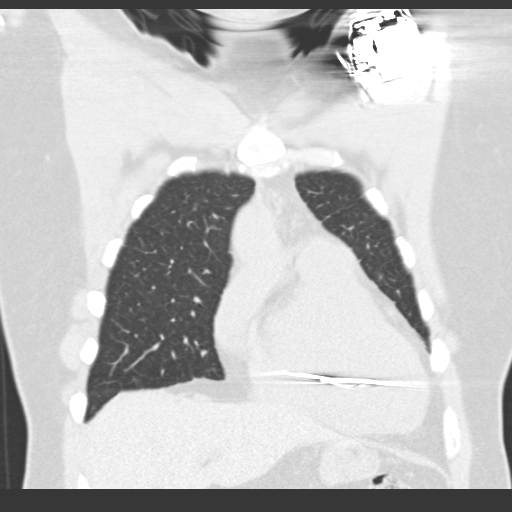
[im 44/109  lung]
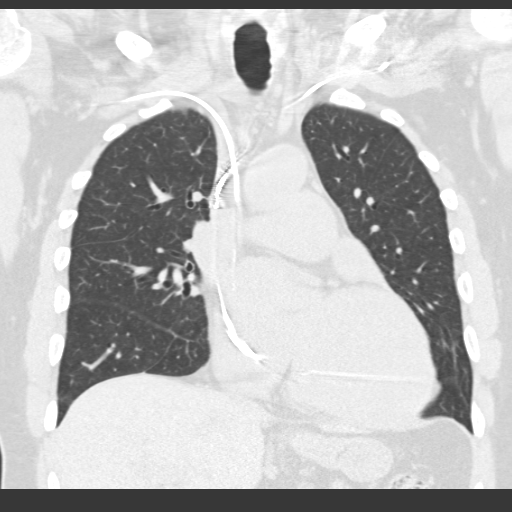
[im 65/109  lung]
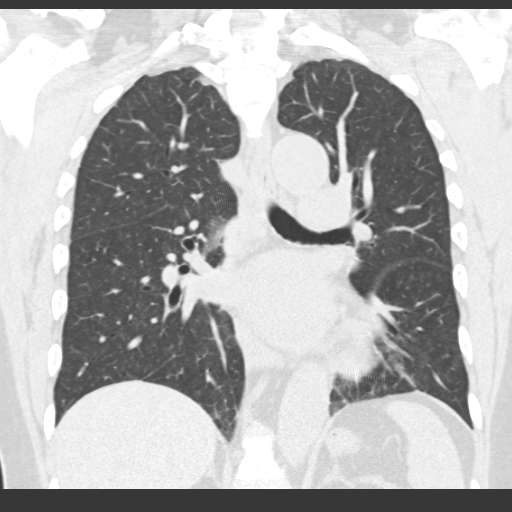

[15 of 36 positions shown; findings below may reference images not displayed]

FINDINGS: Left-sided defibrillator in place.  Leads terminate
within the distal SVC and right ventricle, respectively.  Heart
size is at upper limits of normal.  No pericardial or pleural
effusion.  Main pulmonary arterial diameter measures 3.9 cm
transversely image 31 which is at upper limits of normal compared
to adjacent aortic diameter.  Small pretracheal lymph node measures
1.1 cm in maximal diameter image 24.

Cervical fusion hardware partly visualized.  Central airways are
patent.  No pulmonary mass, nodule, or consolidation.  No acute
osseous abnormality.
IMPRESSION: Borderline pulmonary perianal enlargement which may indicate
pulmonary arterial hypertension.  Clear lungs without acute
abnormality.

## 2013-08-25 NOTE — Telephone Encounter (Signed)
Pt has appt with Alonza Bogus, PA on 9/8 @10 :30, pt aware

## 2013-08-26 ENCOUNTER — Encounter: Payer: Self-pay | Admitting: Gastroenterology

## 2013-08-27 ENCOUNTER — Encounter: Payer: Self-pay | Admitting: Internal Medicine

## 2013-08-27 ENCOUNTER — Other Ambulatory Visit (HOSPITAL_COMMUNITY): Payer: Self-pay | Admitting: *Deleted

## 2013-08-27 DIAGNOSIS — I5023 Acute on chronic systolic (congestive) heart failure: Secondary | ICD-10-CM

## 2013-08-27 MED ORDER — TORSEMIDE 20 MG PO TABS: ORAL_TABLET | ORAL | Status: DC

## 2013-08-27 MED ORDER — METOLAZONE 2.5 MG PO TABS: 2.5000 mg | ORAL_TABLET | Freq: Every day | ORAL | Status: DC | PRN

## 2013-08-28 ENCOUNTER — Ambulatory Visit (INDEPENDENT_AMBULATORY_CARE_PROVIDER_SITE_OTHER): Payer: Medicare Other | Admitting: Family Medicine

## 2013-08-28 ENCOUNTER — Encounter: Payer: Self-pay | Admitting: Family Medicine

## 2013-08-28 VITALS — BP 100/69 | HR 121 | Ht 75.0 in | Wt 240.0 lb

## 2013-08-28 DIAGNOSIS — M25519 Pain in unspecified shoulder: Secondary | ICD-10-CM

## 2013-08-28 DIAGNOSIS — M25511 Pain in right shoulder: Secondary | ICD-10-CM

## 2013-08-28 MED ORDER — PREDNISONE 10 MG PO TABS: ORAL_TABLET | ORAL | Status: DC

## 2013-08-29 ENCOUNTER — Encounter: Payer: Self-pay | Admitting: Family Medicine

## 2013-08-29 NOTE — Progress Notes (Signed)
Patient ID: [name], male   DOB: 06-10-1950, 63 y.o.   MRN: [id number]  PCP: Nance Pear., NP Referred by: Debbrah Alar NP  Subjective:   HPI: Patient is a 63 y.o. male here for right shoulder injury.  6/25: Patient reports about 2 weeks ago he was helping clean up at his church. Was mopping with a big industrial mop. No acute injury but by next day shoulder was painful and has worsened since. No night pain. Takes pain medicine regularly for chronic pain. Some radiation into neck and right chest. No numbness/tingling.  7/23: Patient reports his shoulder feels better. Is s/p injection and did PT for shoulder, 3 visits, but felt more sick doing this. Still doing home exercise program though. Some night pain - can't lie down on this side.  8/27: Patient reports pain has maybe slightly worsened since last visit, now 6/10 level of pain. Shooting pains down right arm. Some numbness into fingers at times. Pain worse with motions of right shoulder. Some neck pain. No bowel/bladder dysfunction. Difficulty sleeping at times.  Past Medical History  Diagnosis Date  . CHF (congestive heart failure)   . Sarcoidosis   . Cardiomyopathy, dilated, nonischemic     non ischemic by cath  . Acute on chronic systolic heart failure   . Automatic implantable cardiac defibrillator in situ   . Atrial fibrillation   . NSVT (nonsustained ventricular tachycardia)   . GERD (gastroesophageal reflux disease)   . Hypercholesteremia   . Myocardial infarction   . Shortness of breath   . Chronic kidney disease (CKD), stage III (moderate)   . Pacemaker   . Anginal pain   . Gout   . Hypertension     dr [name]  . Coronary artery disease   . Elevated PSA 06/24/2013    Current Outpatient Prescriptions on File Prior to Visit  Medication Sig Dispense Refill  . allopurinol (ZYLOPRIM) 100 MG tablet Take 1 tablet (100 mg total) by mouth daily.  30 tablet  6  . amiodarone  (PACERONE) 200 MG tablet Take 200 mg by mouth 2 (two) times daily.      Marland Kitchen apixaban (ELIQUIS) 5 MG TABS tablet Take 1 tablet (5 mg total) by mouth 2 (two) times daily.  60 tablet  4  . carvedilol (COREG) 3.125 MG tablet Take 3.125 mg by mouth 2 (two) times daily with a meal.      . eplerenone (INSPRA) 25 MG tablet Take 1 tablet (25 mg total) by mouth daily.  30 tablet  3  . esomeprazole (NEXIUM) 40 MG capsule Take 1 capsule (40 mg total) by mouth 2 (two) times daily before a meal.  60 capsule  1  . fluticasone (FLONASE) 50 MCG/ACT nasal spray Place 2 sprays into both nostrils daily as needed for allergies.       Marland Kitchen LORazepam (ATIVAN) 0.5 MG tablet Take 1-2 tablets (0.5-1 mg total) by mouth 2 (two) times daily as needed for anxiety.  50 tablet  0  . metolazone (ZAROXOLYN) 2.5 MG tablet Take 1 tablet (2.5 mg total) by mouth daily as needed (for fluid retention).  10 tablet  6  . milrinone (PRIMACOR) 20 MG/100ML SOLN infusion Inject 0.375 mcg/kg/min into the vein continuous. 440mg  in 429ml infusion 2.5 ml per hour      . NEXIUM 40 MG capsule TAKE 1 CAPSULE EVERY DAY  30 capsule  3  . Oxycodone HCl 20 MG TABS Take 1 tablet (20 mg total) by mouth 3 (  three) times daily as needed.  90 tablet  0  . potassium chloride SA (K-DUR,KLOR-CON) 20 MEQ tablet Take 60 meq (3 tablets) in the morning and 40 meq (2 tablets) in the evening.  150 tablet  3  . promethazine (PHENERGAN) 25 MG tablet Take 25 mg by mouth every 6 (six) hours as needed for nausea or vomiting.      . ranitidine (ZANTAC) 150 MG capsule Take 150 mg by mouth 2 (two) times daily.      Marland Kitchen senna-docusate (SENOKOT-S) 8.6-50 MG per tablet Take 1-2 tablets by mouth 2 (two) times daily as needed for mild constipation or moderate constipation.      Marland Kitchen spironolactone (ALDACTONE) 25 MG tablet       . torsemide (DEMADEX) 20 MG tablet Take 2 tabs in the am and 3 tabs in the pm  150 tablet  6  . zolpidem (AMBIEN) 5 MG tablet Take 1 tablet (5 mg total) by mouth at  bedtime as needed for sleep.  30 tablet  0   No current facility-administered medications on file prior to visit.    Past Surgical History  Procedure Laterality Date  . Back surgery  1987    Ruptured disk repair  . Pacemaker insertion      with ICD  . Tee without cardioversion  01/17/2011    Procedure: TRANSESOPHAGEAL ECHOCARDIOGRAM (TEE);  Surgeon: Birdie Riddle, MD;  Location: [place];  Service: Cardiovascular;  Laterality: N/A;  . Cardioversion  01/17/2011    Procedure: CARDIOVERSION;  Surgeon: Birdie Riddle, MD;  Location: [place];  Service: Cardiovascular;  Laterality: N/A;  . Cardiac catheterization    . Insert / replace / remove pacemaker    . Anterior cervical decomp/discectomy fusion  08/21/2011    Procedure: ANTERIOR CERVICAL DECOMPRESSION/DISCECTOMY FUSION 2 LEVELS;  Surgeon: Marybelle Killings, MD;  Location: [place];  Service: Orthopedics;  Laterality: N/A;  C5-6, C6-7 Anterior Cervical Discectomy and Fusion, allograft, plate  . Tee without cardioversion N/A 02/16/2012    Procedure: TRANSESOPHAGEAL ECHOCARDIOGRAM (TEE);  Surgeon: Jolaine Artist, MD;  Location: [facility] ENDOSCOPY;  Service: Cardiovascular;  Laterality: N/A;  original case scheduled under his dad (who is deceased), rescheduled under correct mrn/pt/dob. [place]/dl  . Tee without cardioversion N/A 03/22/2012    Procedure: TRANSESOPHAGEAL ECHOCARDIOGRAM (TEE);  Surgeon: Lelon Perla, MD;  Location: [place];  Service: Cardiovascular;  Laterality: N/A;  . Cardioversion N/A 03/22/2012    Procedure: CARDIOVERSION;  Surgeon: Lelon Perla, MD;  Location: [facility] ENDOSCOPY;  Service: Cardiovascular;  Laterality: N/A;  . Cardioversion N/A 04/26/2012    Procedure: CARDIOVERSION;  Surgeon: Jolaine Artist, MD;  Location: [facility] ENDOSCOPY;  Service: Cardiovascular;  Laterality: N/A;  . Central venous catheter tunneled insertion single lumen  09/16/2012    right IJ  . Cardioversion N/A 11/08/2012    Procedure: CARDIOVERSION;   Surgeon: Jolaine Artist, MD;  Location: [facility] ENDOSCOPY;  Service: Cardiovascular;  Laterality: N/A;    Allergies  Allergen Reactions  . Nitroglycerin Other (See Comments)    "feels like head is going to bust open"    History   Social History  . Marital Status: Single    Spouse Name: N/A    Number of Children: N/A  . Years of Education: N/A   Occupational History  . Not on file.   Social History Main Topics  . Smoking status: Never Smoker   . Smokeless tobacco: Never Used  . Alcohol Use: No  . Drug  Use: Yes    Special: "Crack" cocaine, Marijuana, Cocaine     Comment: last use Novemenber  . Sexual Activity: Not Currently   Other Topics Concern  . Not on file   Social History Narrative   3 sons- one in [place], 1 in [place], on in [place]   Retired from post office- on disability   Completed Bachelors from [employer]    Family History  Problem Relation Age of Onset  . Heart disease    . Heart failure    . Stroke    . Anesthesia problems Neg Hx   . Hypotension Neg Hx   . Malignant hyperthermia Neg Hx   . Pseudochol deficiency Neg Hx     BP 100/69  Pulse 121  Ht 6\' 3"  (1.905 m)  Wt 240 lb (108.863 kg)  BMI 30.00 kg/m2  Review of Systems: See HPI above.    Objective:  Physical Exam:  Gen: NAD  Neck: No gross deformity, swelling, bruising. TTP mildly right cervical paraspinal region.  No midline/bony TTP. FROM neck without pain. BUE strength 5/5 (see rotator cuff below).   Sensation intact to light touch currently.   1+ equal reflexes in triceps, biceps, brachioradialis tendons. Negative spurlings. NV intact distal BUEs.  Right shoulder: No swelling, ecchymoses.  No gross deformity. TTP mildly right cervical paraspinal muscles, trapezius. Abduction and flexion to 130 degrees, full ER. Positive Hawkins, Neers. Negative Speeds, Yergasons. Strength 5-/5 with empty can and 5/5 resisted internal/external rotation. Negative apprehension. NV intact distally.     Assessment & Plan:  1. Right shoulder injury - overuse rotator cuff strain, probable irritation of cervical nerve root as well vs cervical spasms.  His options at this point are limited.  He gets severe headaches with nitro patches.  Did not tolerate therapy - made him feel ill.  Poor candidate for surgical intervention.  Advised to trial prednisone dose pack vs repeat injection - he will try prednisone first, call us if not improving over next 1-2 weeks.

## 2013-08-29 NOTE — Assessment & Plan Note (Signed)
overuse rotator cuff strain, probable irritation of cervical nerve root as well vs cervical spasms.  His options at this point are limited.  He gets severe headaches with nitro patches.  Did not tolerate therapy - made him feel ill.  Poor candidate for surgical intervention.  Advised to trial prednisone dose pack vs repeat injection - he will try prednisone first, call us if not improving over next 1-2 weeks.

## 2013-09-09 ENCOUNTER — Encounter: Payer: Self-pay | Admitting: Family Medicine

## 2013-09-09 ENCOUNTER — Encounter: Payer: Self-pay | Admitting: Gastroenterology

## 2013-09-09 ENCOUNTER — Telehealth: Payer: Self-pay | Admitting: *Deleted

## 2013-09-09 ENCOUNTER — Ambulatory Visit (INDEPENDENT_AMBULATORY_CARE_PROVIDER_SITE_OTHER): Payer: Medicare Other | Admitting: Gastroenterology

## 2013-09-09 VITALS — BP 106/58 | HR 56 | Ht 74.75 in | Wt 243.4 lb

## 2013-09-09 DIAGNOSIS — G8929 Other chronic pain: Secondary | ICD-10-CM

## 2013-09-09 DIAGNOSIS — Z1211 Encounter for screening for malignant neoplasm of colon: Secondary | ICD-10-CM | POA: Insufficient documentation

## 2013-09-09 DIAGNOSIS — R112 Nausea with vomiting, unspecified: Secondary | ICD-10-CM | POA: Insufficient documentation

## 2013-09-09 DIAGNOSIS — R1013 Epigastric pain: Secondary | ICD-10-CM

## 2013-09-09 MED ORDER — MOVIPREP 100 G PO SOLR: 1.0000 | Freq: Once | ORAL | Status: DC

## 2013-09-09 MED ORDER — SUCRALFATE 1 GM/10ML PO SUSP
1.0000 g | Freq: Four times a day (QID) | ORAL | Status: DC
Start: 2013-09-09 — End: 2014-09-11

## 2013-09-09 NOTE — Telephone Encounter (Signed)
LMOM for patient to call back.

## 2013-09-09 NOTE — Patient Instructions (Signed)
You have been scheduled for an endoscopy and colonoscopy. Please follow the written instructions given to you at your visit today. Please pick up your prep at the pharmacy within the next 1-3 days. If you use inhalers (even only as needed), please bring them with you on the day of your procedure. Your physician has requested that you go to www.startemmi.com and enter the access code given to you at your visit today. This web site gives a general overview about your procedure. However, you should still follow specific instructions given to you by our office regarding your preparation for the procedure.  We have sent the following medications to your pharmacy for you to pick up at your convenience: Carafate

## 2013-09-09 NOTE — Telephone Encounter (Signed)
Status: Addendum        09/09/2013  RE: [name]  DOB: [birth year]  MRN: [id number]  Dear Dr Haroldine Laws,  We have scheduled the above patient for an Upper Endoscopy and Colonoscopy. Our records show that he is on anticoagulation therapy.  Please advise as to how long the patient may come off his therapy of Eliquis prior to the procedure, which is scheduled for 09-18-2013.  Please fax back/ or route the completed form to [employer].  Sincerely,  Hope Pigeon  Ok to come off 24 hours prior to procedure. Restart the night following the procedure.  Benay Spice  3:37 PM

## 2013-09-09 NOTE — Progress Notes (Signed)
I agree with the above note, plan 

## 2013-09-09 NOTE — Progress Notes (Signed)
09/09/2013 [name] [id number] 11-26-1950   HISTORY OF PRESENT ILLNESS:  This is a 63 year old male with multiple medical problems including hypertension, hyperlipidemia, chronic kidney disease stage 2-3, systolic CHF with EF of 61-95%, atrial fibrillation, ICD placement, depression with anxiety, and chronic pain. He is on Eliquis and follows with Dr. Lovena Le and Dr. Haroldine Laws for his heart.  He also takes Marinol via a pump.  He presents to our office today with complaints of epigastric abdominal pain and vomiting. He states that the pain has been present for the past 2-3 years but has gotten progressively worse particularly over the past 6 months or so. He also now complains of vomiting immediately following eating at times. The vomiting is new and he had not been experiencing that in the past. He reports that the pain does come and go, but when it is present it is constant and not necessarily worsened by eating. When the pain is present it will last a few days and then go away for a day or 2 before returning again. The pain does radiate to his left side at times. He complains of abdominal bloating and distention.  He is currently on Nexium 40 mg twice daily and had taken Zantac in the past as well, but is not taking that currently. He had an abdominal ultrasound in August 2015, which showed suggestion of mild gallbladder sludge, mild hepatic steatosis without focal mass, and subcentimeter left renal cyst, but was otherwise unremarkable. He had an EGD in June of 2012 by Dr. Michail Sermon for evaluation of epigastric abdominal pain that was normal at that time.  Recent CBC, lipase, and hepatic function panel were within normal limits. He had a CT scan in January 2014 without contrast for evaluation of epigastric abdominal pain at that time as well, which revealed cardiomegaly with right pleural effusion and a lesion in the upper pole of his right kidney, but was otherwise unremarkable.  He has never  undergone colonoscopy in the past.   Past Medical History  Diagnosis Date  . CHF (congestive heart failure)   . Sarcoidosis   . Cardiomyopathy, dilated, nonischemic     non ischemic by cath  . Acute on chronic systolic heart failure   . Automatic implantable cardiac defibrillator in situ   . Atrial fibrillation   . NSVT (nonsustained ventricular tachycardia)   . GERD (gastroesophageal reflux disease)   . Hypercholesteremia   . Myocardial infarction   . Shortness of breath   . Chronic kidney disease (CKD), stage III (moderate)   . Pacemaker   . Anginal pain   . Gout   . Hypertension     dr [name]  . Coronary artery disease   . Elevated PSA 06/24/2013   Past Surgical History  Procedure Laterality Date  . Back surgery  1987    Ruptured disk repair  . Pacemaker insertion      with ICD  . Tee without cardioversion  01/17/2011    Procedure: TRANSESOPHAGEAL ECHOCARDIOGRAM (TEE);  Surgeon: Birdie Riddle, MD;  Location: [place];  Service: Cardiovascular;  Laterality: N/A;  . Cardioversion  01/17/2011    Procedure: CARDIOVERSION;  Surgeon: Birdie Riddle, MD;  Location: [place];  Service: Cardiovascular;  Laterality: N/A;  . Cardiac catheterization    . Insert / replace / remove pacemaker    . Anterior cervical decomp/discectomy fusion  08/21/2011    Procedure: ANTERIOR CERVICAL DECOMPRESSION/DISCECTOMY FUSION 2 LEVELS;  Surgeon: Marybelle Killings, MD;  Location: [place];  Service: Orthopedics;  Laterality: N/A;  C5-6, C6-7 Anterior Cervical Discectomy and Fusion, allograft, plate  . Tee without cardioversion N/A 02/16/2012    Procedure: TRANSESOPHAGEAL ECHOCARDIOGRAM (TEE);  Surgeon: Jolaine Artist, MD;  Location: [facility] ENDOSCOPY;  Service: Cardiovascular;  Laterality: N/A;  original case scheduled under his dad (who is deceased), rescheduled under correct mrn/pt/dob. [place]/dl  . Tee without cardioversion N/A 03/22/2012    Procedure: TRANSESOPHAGEAL ECHOCARDIOGRAM (TEE);  Surgeon:  Lelon Perla, MD;  Location: [place];  Service: Cardiovascular;  Laterality: N/A;  . Cardioversion N/A 03/22/2012    Procedure: CARDIOVERSION;  Surgeon: Lelon Perla, MD;  Location: [facility] ENDOSCOPY;  Service: Cardiovascular;  Laterality: N/A;  . Cardioversion N/A 04/26/2012    Procedure: CARDIOVERSION;  Surgeon: Jolaine Artist, MD;  Location: [facility] ENDOSCOPY;  Service: Cardiovascular;  Laterality: N/A;  . Central venous catheter tunneled insertion single lumen  09/16/2012    right IJ  . Cardioversion N/A 11/08/2012    Procedure: CARDIOVERSION;  Surgeon: Jolaine Artist, MD;  Location: [facility] ENDOSCOPY;  Service: Cardiovascular;  Laterality: N/A;    reports that he has never smoked. He has never used smokeless tobacco. He reports that he uses illicit drugs ("Crack" cocaine, Marijuana, and Cocaine). He reports that he does not drink alcohol. family history includes Heart disease in an other family member; Heart failure in an other family member; Stroke in an other family member. There is no history of Anesthesia problems, Hypotension, Malignant hyperthermia, or Pseudochol deficiency. Allergies  Allergen Reactions  . Nitroglycerin Other (See Comments)    "feels like head is going to bust open"      Outpatient Encounter Prescriptions as of 09/09/2013  Medication Sig  . allopurinol (ZYLOPRIM) 100 MG tablet Take 1 tablet (100 mg total) by mouth daily.  Marland Kitchen amiodarone (PACERONE) 200 MG tablet Take 200 mg by mouth 2 (two) times daily.  Marland Kitchen apixaban (ELIQUIS) 5 MG TABS tablet Take 1 tablet (5 mg total) by mouth 2 (two) times daily.  . carvedilol (COREG) 3.125 MG tablet Take 3.125 mg by mouth 2 (two) times daily with a meal.  . eplerenone (INSPRA) 25 MG tablet Take 1 tablet (25 mg total) by mouth daily.  Marland Kitchen esomeprazole (NEXIUM) 40 MG capsule Take 1 capsule (40 mg total) by mouth 2 (two) times daily before a meal.  . fluticasone (FLONASE) 50 MCG/ACT nasal spray Place 2 sprays into both nostrils  daily as needed for allergies.   Marland Kitchen LORazepam (ATIVAN) 0.5 MG tablet Take 1-2 tablets (0.5-1 mg total) by mouth 2 (two) times daily as needed for anxiety.  . metolazone (ZAROXOLYN) 2.5 MG tablet Take 1 tablet (2.5 mg total) by mouth daily as needed (for fluid retention).  . milrinone (PRIMACOR) 20 MG/100ML SOLN infusion Inject 0.375 mcg/kg/min into the vein continuous. 440mg  in 432ml infusion 2.5 ml per hour  . NEXIUM 40 MG capsule TAKE 1 CAPSULE EVERY DAY  . Oxycodone HCl 20 MG TABS Take 1 tablet (20 mg total) by mouth 3 (three) times daily as needed.  . potassium chloride SA (K-DUR,KLOR-CON) 20 MEQ tablet Take 60 meq (3 tablets) in the morning and 40 meq (2 tablets) in the evening.  . predniSONE (DELTASONE) 10 MG tablet 6 tabs po day 1, 5 tabs po day 2, 4 tabs po day 3, 3 tabs po day 4, 2 tabs po day 5, 1 tab po day 6  . promethazine (PHENERGAN) 25 MG tablet Take 25 mg by mouth every 6 (  six) hours as needed for nausea or vomiting.  . ranitidine (ZANTAC) 150 MG capsule Take 150 mg by mouth 2 (two) times daily.  Marland Kitchen senna-docusate (SENOKOT-S) 8.6-50 MG per tablet Take 1-2 tablets by mouth 2 (two) times daily as needed for mild constipation or moderate constipation.  Marland Kitchen spironolactone (ALDACTONE) 25 MG tablet   . torsemide (DEMADEX) 20 MG tablet Take 2 tabs in the am and 3 tabs in the pm  . zolpidem (AMBIEN) 5 MG tablet Take 1 tablet (5 mg total) by mouth at bedtime as needed for sleep.  . sucralfate (CARAFATE) 1 GM/10ML suspension Take 10 mLs (1 g total) by mouth 4 (four) times daily.     REVIEW OF SYSTEMS  : All other systems reviewed and negative except where noted in the History of Present Illness.   PHYSICAL EXAM: BP 106/58  Pulse 56  Ht 6' 2.75" (1.899 m)  Wt 243 lb 6.4 oz (110.406 kg)  BMI 30.62 kg/m2 General: Well developed black male in no acute distress Head: Normocephalic and atraumatic Eyes:  Sclerae anicteric, conjunctiva pink. Ears: Normal auditory acuity Lungs: Clear  throughout to auscultation Heart: Slightly bradycardic. Abdomen: Soft, non-distended.  Normal bowel sounds.  Epigastric TTP without R/R/G. Musculoskeletal: Symmetrical with no gross deformities  Skin: No lesions on visible extremities Extremities: No edema  Neurological: Alert oriented x 4, grossly non-focal Psychological:  Alert and cooperative. Normal mood and affect  ASSESSMENT AND PLAN: -Chronic epigastric abdominal pain, but worsening recently and now with nausea and vomiting:  I had a long discussion with the patient and his wife.  I offered a CT scan vs evaluation with another EGD.  Patient elected for EGD despite it being more invasive testing and stated that he "was willing to take the risk".  Will schedule for EGD pending cardiac clearance and approval to hold Eliquis for 48 hours.  If clearance is denied then I explained to the patient that we would contact him to cancel the procedure and schedule CT scan of the abdomen without contrast instead. -Screening colonoscopy:  Patient has never undergone procedure in the past.  He would like to have colonoscopy as well if we get clearance for EGD since we will be sedating him for that procedure anyway.  Will await for cardiac clearance (see above), but will schedule tentatively.     -Multiple medical problems including extensive cardiac history.  *Procedures tentatively scheduled at [facility] hospital with Dr. Ardis Hughs with MAC pending cardiac clearance and ok to hold Eliquis.

## 2013-09-09 NOTE — Telephone Encounter (Addendum)
09/09/2013   RE: [name] DOB: [birth year] MRN: [id number]   Dear Dr Haroldine Laws,    We have scheduled the above patient for an Upper Endoscopy and Colonoscopy. Our records show that he is on anticoagulation therapy.   Please advise as to how long the patient may come off his therapy of Eliquis prior to the procedure, which is scheduled for 09-18-2013.  Please fax back/ or route the completed form to [employer].  Sincerely,    Hope Pigeon     Ok to come off 24 hours prior to procedure. Restart the night following the procedure.   Benay Spice 3:37 PM

## 2013-09-10 ENCOUNTER — Encounter (HOSPITAL_COMMUNITY): Payer: Self-pay | Admitting: Pharmacy Technician

## 2013-09-10 ENCOUNTER — Other Ambulatory Visit: Payer: Self-pay | Admitting: *Deleted

## 2013-09-10 ENCOUNTER — Encounter (HOSPITAL_COMMUNITY): Payer: Self-pay | Admitting: *Deleted

## 2013-09-10 NOTE — Telephone Encounter (Signed)
Patient called back. I advised patient per Dr. Haroldine Laws that it is okay for him to hold his Eliquis 24 hrs before procedure  Patient verbalized understanding

## 2013-09-11 ENCOUNTER — Telehealth (HOSPITAL_COMMUNITY): Payer: Self-pay | Admitting: *Deleted

## 2013-09-11 MED ORDER — MAGNESIUM OXIDE -MG SUPPLEMENT 400 (240 MG) MG PO TABS: 1.0000 | ORAL_TABLET | Freq: Two times a day (BID) | ORAL | Status: DC

## 2013-09-11 MED ORDER — EPLERENONE 25 MG PO TABS: 25.0000 mg | ORAL_TABLET | Freq: Two times a day (BID) | ORAL | Status: DC

## 2013-09-11 MED ORDER — POTASSIUM CHLORIDE CRYS ER 20 MEQ PO TBCR: 40.0000 meq | EXTENDED_RELEASE_TABLET | Freq: Two times a day (BID) | ORAL | Status: DC

## 2013-09-11 NOTE — Telephone Encounter (Signed)
Received pt's labs, bun 36 cr 1.87, k 3.1, mag 1.4 per Junie Bame, NP increase have pt take extra 40 meq of KCL and increase Inspra to 25 mg Twice daily, add mag ox 400 mg Twice daily, spoke w/pt and he is aware and agreeable, he states he has only been taking KCL 40 meq Twice daily, med list updated

## 2013-09-15 ENCOUNTER — Encounter (HOSPITAL_COMMUNITY): Payer: Self-pay

## 2013-09-15 ENCOUNTER — Ambulatory Visit (HOSPITAL_COMMUNITY)
Admission: RE | Admit: 2013-09-15 | Discharge: 2013-09-15 | Disposition: A | Discharge status: Home / Self Care | Payer: Medicare Other | Source: Ambulatory Visit | Attending: Internal Medicine | Admitting: Internal Medicine

## 2013-09-15 VITALS — BP 116/80 | HR 70 | Wt 250.5 lb

## 2013-09-15 DIAGNOSIS — I509 Heart failure, unspecified: Secondary | ICD-10-CM

## 2013-09-15 DIAGNOSIS — G8929 Other chronic pain: Secondary | ICD-10-CM | POA: Diagnosis not present

## 2013-09-15 DIAGNOSIS — F418 Other specified anxiety disorders: Secondary | ICD-10-CM

## 2013-09-15 DIAGNOSIS — I120 Hypertensive chronic kidney disease with stage 5 chronic kidney disease or end stage renal disease: Secondary | ICD-10-CM | POA: Insufficient documentation

## 2013-09-15 DIAGNOSIS — I428 Other cardiomyopathies: Secondary | ICD-10-CM | POA: Diagnosis not present

## 2013-09-15 DIAGNOSIS — F341 Dysthymic disorder: Secondary | ICD-10-CM

## 2013-09-15 DIAGNOSIS — I5022 Chronic systolic (congestive) heart failure: Secondary | ICD-10-CM | POA: Diagnosis present

## 2013-09-15 DIAGNOSIS — N185 Chronic kidney disease, stage 5: Secondary | ICD-10-CM | POA: Diagnosis not present

## 2013-09-15 DIAGNOSIS — Z95810 Presence of automatic (implantable) cardiac defibrillator: Secondary | ICD-10-CM | POA: Diagnosis not present

## 2013-09-15 DIAGNOSIS — I4891 Unspecified atrial fibrillation: Secondary | ICD-10-CM | POA: Insufficient documentation

## 2013-09-15 DIAGNOSIS — D869 Sarcoidosis, unspecified: Secondary | ICD-10-CM | POA: Diagnosis not present

## 2013-09-15 DIAGNOSIS — I4892 Unspecified atrial flutter: Secondary | ICD-10-CM

## 2013-09-15 DIAGNOSIS — I480 Paroxysmal atrial fibrillation: Secondary | ICD-10-CM

## 2013-09-15 DIAGNOSIS — N184 Chronic kidney disease, stage 4 (severe): Secondary | ICD-10-CM

## 2013-09-15 MED ORDER — HYDROXYZINE HCL 25 MG PO TABS: 25.0000 mg | ORAL_TABLET | Freq: Two times a day (BID) | ORAL | Status: DC | PRN

## 2013-09-15 NOTE — Progress Notes (Signed)
Patient ID: [name], male   DOB: May 06, 1950, 63 y.o.   MRN: [id number]  HPI: Mr. Rishel is a 63 y.o. gentlemen with severe HF due to NICM (EF 20-25%) with multiple hospital admissions for HF exacerbations. He also has history of ventricular tachycardia s/p St Jude ICD by Dr. Lovena Le, CKD: stage IV, and paroxysmal atrial arrhythmias on amiodarone as well as sarcoidosis and hypertension. Cath in 2008 by Dr. Terrence Dupont showed no CAD.Seen at [facility] and not felt to be a transplant candidate due to renal failure and lack of family support.  Milrinone initiated in January 2014 for low output. 03/01/12 milrinone increased 0.373m/kg/min.   ECHO 03/22/12 EF 15%  Echo 6/15: 20-25%  04/26/12 S/P Successful DC-CV for atrial fibrillation. Remains on amiodarone.   RHC 04/30/13 RA = 4  RV = 20/1/3  PA = 23/8 (15)  PCW = 5  Fick cardiac output/index =4.5/1.9  Thermo CO/CI = 3.9/1.6  PVR =  FA sat = 98%  PA sat = 62%, 64% - On milrinone 0.25  Follow-up: Says he has been having good days and bad days. Energy level down. Remains on milrinone 0.375. Feels he is swelling some. Weighing every day 242-243. However up 7 pounds on our scale. Eating a lot. Taking torsemide 60/40. Takes metolazone only on occasion. No palpitations. Pending colonoscopy this week for ab pain.   He is not on an ace inhibitor due to elevated creatinine. Not on nitrates due to Viagra use.   Labs:  08/15/12 Creatinine 4.24 Potassium 3.3 09/19/12 Creatinine 3.98, Potassium 3.1, BUN 78 10/07/12 Creatinine 2.62, Potassium 3.4 10/18/12 Creatinine 3.3, K+3.1 11/08/12 Creatinine 2.15 K 4.0 11/25/12 Creatinine 2.93, K+ 3.5 03/10/13: K+ 2.9, Creatinine 2.63 04/30/13: K+ 3.7, creatinine 2.4 6/15: K 3, creatinine 3.6 09/11/13: K 3.1 Cr 1.87   ROS: All systems negative except as listed in HPI, PMH and Problem List.  Past Medical History  Diagnosis Date  . CHF (congestive heart failure)   . Sarcoidosis   . Cardiomyopathy, dilated,  nonischemic     non ischemic by cath  . Acute on chronic systolic heart failure   . Automatic implantable cardiac defibrillator in situ   . Atrial fibrillation   . NSVT (nonsustained ventricular tachycardia)   . GERD (gastroesophageal reflux disease)   . Hypercholesteremia   . Shortness of breath   . Chronic kidney disease (CKD), stage III (moderate)   . Pacemaker   . Anginal pain   . Gout   . Hypertension     dr hKennith Maes . Coronary artery disease   . Elevated PSA 06/24/2013  . Myocardial infarction    Current Outpatient Prescriptions  Medication Sig Dispense Refill  . allopurinol (ZYLOPRIM) 100 MG tablet Take 1 tablet (100 mg total) by mouth daily.  30 tablet  6  . amiodarone (PACERONE) 200 MG tablet Take 200 mg by mouth 2 (two) times daily.      .Marland Kitchenapixaban (ELIQUIS) 5 MG TABS tablet Take 1 tablet (5 mg total) by mouth 2 (two) times daily.  60 tablet  4  . carvedilol (COREG) 6.25 MG tablet Take 6.25 mg by mouth 2 (two) times daily with a meal.      . eplerenone (INSPRA) 25 MG tablet Take 25 mg by mouth daily.      .Marland Kitchenesomeprazole (NEXIUM) 40 MG capsule Take 40 mg by mouth 2 (two) times daily.      .Marland Kitchenesomeprazole (NEXIUM) 40 MG capsule TAKE 1 CAPSULE twice daily      .  fluticasone (FLONASE) 50 MCG/ACT nasal spray Place 2 sprays into both nostrils daily as needed for allergies.       . LORazepam (ATIVAN) 0.5 MG tablet Take 1-2 tablets (0.5-1 mg total) by mouth 2 (two) times daily as needed for anxiety.  50 tablet  0  . Magnesium Oxide 400 (240 MG) MG TABS Take 1 tablet by mouth 2 (two) times daily.  60 tablet  3  . metolazone (ZAROXOLYN) 2.5 MG tablet Take 1 tablet (2.5 mg total) by mouth daily as needed (for fluid retention).  10 tablet  6  . milrinone (PRIMACOR) 20 MG/100ML SOLN infusion Inject 0.375 mcg/kg/min into the vein continuous. 440mg in 440ml infusion 2.5 ml per hour      . MOVIPREP 100 G SOLR Take 1 kit (200 g total) by mouth once.  1 kit  0  . Oxycodone HCl 20 MG TABS  Take 1 tablet by mouth 3 (three) times daily as needed (pain.).      . potassium chloride SA (K-DUR,KLOR-CON) 20 MEQ tablet Take 3 tabs in AM and 2 tabs in PM      . promethazine (PHENERGAN) 25 MG tablet Take 25 mg by mouth every 6 (six) hours as needed for nausea or vomiting.      . senna-docusate (SENOKOT-S) 8.6-50 MG per tablet Take 1-2 tablets by mouth 2 (two) times daily as needed for mild constipation or moderate constipation.      . sucralfate (CARAFATE) 1 GM/10ML suspension Take 10 mLs (1 g total) by mouth 4 (four) times daily.  420 mL  1  . torsemide (DEMADEX) 20 MG tablet Take 20 mg by mouth See admin instructions. Takes three tablet in the morning (60mg) and two tablet in the evening (40mg)      . zolpidem (AMBIEN) 5 MG tablet Take 1 tablet (5 mg total) by mouth at bedtime as needed for sleep.  30 tablet  0   No current facility-administered medications for this encounter.   Filed Vitals:   09/15/13 0954  BP: 116/80  Pulse: 70  Weight: 250 lb 8 oz (113.626 kg)  SpO2: 96%    PHYSICAL EXAM: General: NAD; well appearing HEENT: normal  Neck: supple. JVP 8-9 cm. Carotids 2+ bilat; no bruits. No lymphadenopathy or thyromegaly appreciated.  Cor: PMI laterally displaced. Regular S1 S2 with +S3 and 2/6 HSM apex. R upper chest hickman site with Milrinone infusing.  Lungs: clear Abdomen: soft, nontender, obese. No bruits or masses. Good bowel sounds.  Mild abdominal distention.  Extremities: no cyanosis, clubbing, rash, no lower extremity edema  Neuro: alert & orientedx3, cranial nerves grossly intact. moves all 4 extremities w/o difficulty. Affect pleasant   ECG: SR 70. iRBBB  ASSESSMENT & PLAN:   1) Chronic systolic HF: NICM, EF 20-25% (06/2013). St Jude ICD. He has been on home milrinone 0.375 mcg/kg/min over 1 year.   - He is stable NYHA IIIB. Volume status looks good despite weight gain. Overall I think he is doing ok, if not a little better.  - Continue low dose coreg 3.125 mg  BID - Continue Inspra 12.5 mg daily  - Continue milrinone 0.375 mcg through hickman cath - Continue torsemide 60/40 with prn metolazone.  - No ACEI with CKD 2) Atrial arrhythmias: Paroxysmal. Now in NSR on amio  He has been on Eliquis without missing doses.  - Will leave amio at 200 bid for now given how many recurrences he has had in past.  - Continue Eliquis. Stop   24 hours for colonoscopy.   3) CKD, stage IV: Creatinine best I have seen it in a while. Continue to follow.  4) Chronic pain: Per pain clinic.   F/u 3 month.   Daniel Bensimhon 09/15/2013          

## 2013-09-15 NOTE — Patient Instructions (Signed)
Ok to restart Hydroxyzine 25 mg twice a day as needed  Your physician recommends that you schedule a follow-up appointment in: 3 months  Do the following things EVERYDAY: 1) Weigh yourself in the morning before breakfast. Write it down and keep it in a log. 2) Take your medicines as prescribed 3) Eat low salt foods-Limit salt (sodium) to 2000 mg per day.  4) Stay as active as you can everyday 5) Limit all fluids for the day to less than 2 liters 6)

## 2013-09-15 NOTE — Addendum Note (Signed)
Encounter addended by: Kerry Dory, CMA on: 09/15/2013 11:02 AM<BR>     Documentation filed: Visit Diagnoses, Patient Instructions Section, Orders

## 2013-09-17 ENCOUNTER — Telehealth (HOSPITAL_COMMUNITY): Payer: Self-pay | Admitting: Vascular Surgery

## 2013-09-17 NOTE — Anesthesia Preprocedure Evaluation (Addendum)
Anesthesia Evaluation  Patient identified by MRN, date of birth, ID band Patient awake  General Assessment Comment:[name] is a 63 y.o. gentlemen with severe HF due to NICM (EF 20-25%) with multiple hospital admissions for HF exacerbations. He also has history of ventricular tachycardia s/p St Jude ICD by Dr. Lovena Le, CKD: stage IV, and paroxysmal atrial arrhythmias on amiodarone as well as sarcoidosis and hypertension. Cath in 2008 by Dr. Terrence Dupont showed no CAD.Seen at [facility] and not felt to be a transplant candidate due to renal failure and lack of family support.   Milrinone initiated in January 2014 for low output. 03/01/12 milrinone increased 0.315mc/kg/min.    Reviewed: Allergy & Precautions, H&P , NPO status , Patient's Chart, lab work & pertinent test results, reviewed documented beta blocker date and time   History of Anesthesia Complications Negative for: history of anesthetic complications  Airway Mallampati: III TM Distance: >3 FB Neck ROM: Full    Dental no notable dental hx. (+) Missing,    Pulmonary shortness of breath and Long-Term Oxygen Therapy,  breath sounds clear to auscultation  Pulmonary exam normal       Cardiovascular Exercise Tolerance: Poor hypertension, Pt. on home beta blockers and Pt. on medications + angina + CAD, + Past MI and +CHF + dysrhythmias Atrial Fibrillation + pacemaker + Cardiac Defibrillator IIIRhythm:Regular Rate:Normal  Recently saw cardiologist and felt to be stable, if not in better shape that seen prior. Patient denies any significant respiratory difficulty or fluid overload. Reports that swelling is actually decreased and breathing is stable.    Neuro/Psych PSYCHIATRIC DISORDERS Anxiety Depression negative neurological ROS     GI/Hepatic Neg liver ROS, GERD-  Medicated and Controlled,  Endo/Other  negative endocrine ROS  Renal/GU CRFRenal disease  negative genitourinary    Musculoskeletal negative musculoskeletal ROS (+)   Abdominal   Peds negative pediatric ROS (+)  Hematology negative hematology ROS (+)   Anesthesia Other Findings   Reproductive/Obstetrics negative OB ROS                         Anesthesia Physical Anesthesia Plan  ASA: IV  Anesthesia Plan: MAC   Post-op Pain Management:    Induction: Intravenous  Airway Management Planned: Nasal Cannula  Additional Equipment:   Intra-op Plan:   Post-operative Plan:   Informed Consent: I have reviewed the patients History and Physical, chart, labs and discussed the procedure including the risks, benefits and alternatives for the proposed anesthesia with the patient or authorized representative who has indicated his/her understanding and acceptance.   Dental advisory given  Plan Discussed with: CRNA  Anesthesia Plan Comments:         Anesthesia Quick Evaluation

## 2013-09-17 NOTE — Telephone Encounter (Signed)
[facility] concerning pt right ankle edema, Distended stomach... He has a colonoscopy tomorrow he has to drink 64 ounces of fluid and a quart of prep.. pleae advise

## 2013-09-18 ENCOUNTER — Encounter (HOSPITAL_COMMUNITY): Payer: Medicare Other | Admitting: Anesthesiology

## 2013-09-18 ENCOUNTER — Encounter (HOSPITAL_COMMUNITY): Payer: Self-pay | Admitting: *Deleted

## 2013-09-18 ENCOUNTER — Encounter (HOSPITAL_COMMUNITY)
Admission: RE | Disposition: A | Discharge status: Still In Hospital | Payer: Self-pay | Source: Ambulatory Visit | Attending: Gastroenterology

## 2013-09-18 ENCOUNTER — Emergency Department (HOSPITAL_COMMUNITY)
Admission: EM | Admit: 2013-09-18 | Discharge: 2013-09-18 | Disposition: A | Discharge status: Home / Self Care | Payer: Medicare Other | Attending: Emergency Medicine | Admitting: Emergency Medicine

## 2013-09-18 ENCOUNTER — Ambulatory Visit (HOSPITAL_COMMUNITY)
Admission: RE | Admit: 2013-09-18 | Discharge: 2013-09-18 | Disposition: A | Discharge status: Still In Hospital | Payer: Medicare Other | Source: Ambulatory Visit | Attending: Gastroenterology | Admitting: Gastroenterology

## 2013-09-18 ENCOUNTER — Ambulatory Visit (HOSPITAL_COMMUNITY): Payer: Medicare Other | Admitting: Anesthesiology

## 2013-09-18 ENCOUNTER — Encounter (HOSPITAL_COMMUNITY): Payer: Self-pay | Admitting: Emergency Medicine

## 2013-09-18 ENCOUNTER — Telehealth: Payer: Self-pay

## 2013-09-18 DIAGNOSIS — I509 Heart failure, unspecified: Secondary | ICD-10-CM | POA: Insufficient documentation

## 2013-09-18 DIAGNOSIS — Z862 Personal history of diseases of the blood and blood-forming organs and certain disorders involving the immune mechanism: Secondary | ICD-10-CM | POA: Insufficient documentation

## 2013-09-18 DIAGNOSIS — G8929 Other chronic pain: Secondary | ICD-10-CM | POA: Diagnosis not present

## 2013-09-18 DIAGNOSIS — Z5309 Procedure and treatment not carried out because of other contraindication: Secondary | ICD-10-CM | POA: Insufficient documentation

## 2013-09-18 DIAGNOSIS — N182 Chronic kidney disease, stage 2 (mild): Secondary | ICD-10-CM | POA: Diagnosis not present

## 2013-09-18 DIAGNOSIS — Z1211 Encounter for screening for malignant neoplasm of colon: Secondary | ICD-10-CM

## 2013-09-18 DIAGNOSIS — R112 Nausea with vomiting, unspecified: Secondary | ICD-10-CM | POA: Insufficient documentation

## 2013-09-18 DIAGNOSIS — F411 Generalized anxiety disorder: Secondary | ICD-10-CM | POA: Insufficient documentation

## 2013-09-18 DIAGNOSIS — Z950 Presence of cardiac pacemaker: Secondary | ICD-10-CM | POA: Diagnosis not present

## 2013-09-18 DIAGNOSIS — E7800 Pure hypercholesterolemia, unspecified: Secondary | ICD-10-CM | POA: Insufficient documentation

## 2013-09-18 DIAGNOSIS — D869 Sarcoidosis, unspecified: Secondary | ICD-10-CM | POA: Diagnosis not present

## 2013-09-18 DIAGNOSIS — I2510 Atherosclerotic heart disease of native coronary artery without angina pectoris: Secondary | ICD-10-CM | POA: Insufficient documentation

## 2013-09-18 DIAGNOSIS — I129 Hypertensive chronic kidney disease with stage 1 through stage 4 chronic kidney disease, or unspecified chronic kidney disease: Secondary | ICD-10-CM | POA: Diagnosis not present

## 2013-09-18 DIAGNOSIS — R1013 Epigastric pain: Secondary | ICD-10-CM | POA: Diagnosis not present

## 2013-09-18 DIAGNOSIS — I4891 Unspecified atrial fibrillation: Secondary | ICD-10-CM | POA: Diagnosis not present

## 2013-09-18 DIAGNOSIS — Z9981 Dependence on supplemental oxygen: Secondary | ICD-10-CM | POA: Insufficient documentation

## 2013-09-18 DIAGNOSIS — R031 Nonspecific low blood-pressure reading: Secondary | ICD-10-CM | POA: Insufficient documentation

## 2013-09-18 DIAGNOSIS — I252 Old myocardial infarction: Secondary | ICD-10-CM | POA: Insufficient documentation

## 2013-09-18 DIAGNOSIS — F3289 Other specified depressive episodes: Secondary | ICD-10-CM | POA: Insufficient documentation

## 2013-09-18 DIAGNOSIS — N183 Chronic kidney disease, stage 3 unspecified: Secondary | ICD-10-CM | POA: Diagnosis not present

## 2013-09-18 DIAGNOSIS — Z79899 Other long term (current) drug therapy: Secondary | ICD-10-CM | POA: Insufficient documentation

## 2013-09-18 DIAGNOSIS — K219 Gastro-esophageal reflux disease without esophagitis: Secondary | ICD-10-CM | POA: Insufficient documentation

## 2013-09-18 DIAGNOSIS — I5023 Acute on chronic systolic (congestive) heart failure: Secondary | ICD-10-CM | POA: Diagnosis not present

## 2013-09-18 DIAGNOSIS — R000 Tachycardia, unspecified: Secondary | ICD-10-CM | POA: Diagnosis not present

## 2013-09-18 DIAGNOSIS — I959 Hypotension, unspecified: Secondary | ICD-10-CM

## 2013-09-18 DIAGNOSIS — E785 Hyperlipidemia, unspecified: Secondary | ICD-10-CM | POA: Diagnosis not present

## 2013-09-18 DIAGNOSIS — Z8639 Personal history of other endocrine, nutritional and metabolic disease: Secondary | ICD-10-CM | POA: Insufficient documentation

## 2013-09-18 DIAGNOSIS — Z95810 Presence of automatic (implantable) cardiac defibrillator: Secondary | ICD-10-CM | POA: Insufficient documentation

## 2013-09-18 DIAGNOSIS — F329 Major depressive disorder, single episode, unspecified: Secondary | ICD-10-CM | POA: Insufficient documentation

## 2013-09-18 LAB — POCT I-STAT 4, (NA,K, GLUC, HGB,HCT)
Glucose, Bld: 220 mg/dL — ABNORMAL HIGH (ref 70–99)
HCT: 43 % (ref 39.0–52.0)
Hemoglobin: 14.6 g/dL (ref 13.0–17.0)
POTASSIUM: 3.3 meq/L — AB (ref 3.7–5.3)
SODIUM: 141 meq/L (ref 137–147)

## 2013-09-18 SURGERY — CANCELLED PROCEDURE
Anesthesia: Monitor Anesthesia Care

## 2013-09-18 MED ORDER — SODIUM CHLORIDE 0.9 % IV SOLN: INTRAVENOUS | Status: DC

## 2013-09-18 MED ORDER — PROPOFOL 10 MG/ML IV BOLUS
INTRAVENOUS | Status: AC
  Filled 2013-09-18: qty 20

## 2013-09-18 MED ORDER — PHENYLEPHRINE HCL 10 MG/ML IJ SOLN
INTRAMUSCULAR | Status: DC | PRN
  Administered 2013-09-18: 40 ug via INTRAVENOUS
  Administered 2013-09-18: 80 ug via INTRAVENOUS

## 2013-09-18 MED ORDER — HEPARIN SOD (PORK) LOCK FLUSH 100 UNIT/ML IV SOLN
250.0000 [IU] | INTRAVENOUS | Status: AC | PRN
  Administered 2013-09-18: 250 [IU]

## 2013-09-18 MED ORDER — SODIUM CHLORIDE 0.9 % IJ SOLN
10.0000 mL | INTRAMUSCULAR | Status: DC | PRN
  Administered 2013-09-18: 10 mL

## 2013-09-18 MED ORDER — LABETALOL HCL 5 MG/ML IV SOLN
INTRAVENOUS | Status: DC | PRN
  Administered 2013-09-18 (×2): 5 mg via INTRAVENOUS

## 2013-09-18 MED ORDER — LABETALOL HCL 5 MG/ML IV SOLN
INTRAVENOUS | Status: AC
  Filled 2013-09-18: qty 4

## 2013-09-18 MED ORDER — LACTATED RINGERS IV SOLN
INTRAVENOUS | Status: DC
  Administered 2013-09-18: 07:00:00 via INTRAVENOUS

## 2013-09-18 NOTE — Interval H&P Note (Signed)
History and Physical Interval Note:  09/18/2013 7:15 AM  [name]  has presented today for surgery, with the diagnosis of screening/epigastric pain/nausea/vomiting  The various methods of treatment have been discussed with the patient and family. After consideration of risks, benefits and other options for treatment, the patient has consented to  Procedure(s): COLONOSCOPY (N/A) ESOPHAGOGASTRODUODENOSCOPY (EGD) (N/A) as a surgical intervention .  The patient's history has been reviewed, patient examined, no change in status, stable for surgery.  I have reviewed the patient's chart and labs.  Questions were answered to the patient's satisfaction.     Milus Banister

## 2013-09-18 NOTE — Progress Notes (Signed)
[facility]  Patient Status: Active pt with [facility]   [facility] is providing the following services:   [facility] and [facility] for home Milrinone therapy.  [facility] hospital team will follow and support DC home when deemed appropriate.   If patient discharges after hours, please call [phone].   Larry Sierras 09/18/2013, 10:32 AM

## 2013-09-18 NOTE — Telephone Encounter (Signed)
Message copied by Barron Alvine on Thu Sep 18, 2013  8:19 AM ------      Message from: Owens Loffler P      Created: Thu Sep 18, 2013  7:55 AM       He was hypotensive, HR too fast to safely undergo sedation, procedures this AM.  Can you set up ROV with me (wait list for expedited next availale) to discuss further options.            Thanks       ------

## 2013-09-18 NOTE — ED Provider Notes (Signed)
CSN: [id number]     Arrival date & time 09/18/13  7341 History   First MD Initiated Contact with Patient 09/18/13 [phone]     Chief Complaint  Patient presents with  . Hypotension     (Consider location/radiation/quality/duration/timing/severity/associated sxs/prior Treatment) HPI  [name] is a 63 y.o. male who was at the endoscopy suite. This morning, when it was noted that he was tachycardic and hypotensive. Documentation in the chart indicates a defibrillation, but no EKG was done at that time. It is reported to me that he was given labetalol and Neo-Synephrine. I don't see documentation for these medications, in EPIC. On arrival, the patient was hypotensive, but alert and responsive. He had been n.p.o. overnight for the planned endoscopy, which is being done to evaluate chronic upper abdominal pain. At that evaluation. He denies headache, chest pain, overt weakness, nausea, or vomiting. His is his usual medications. He is on milrinone drip for pulmonary hypertension. There are no other known modifying factors.   Past Medical History  Diagnosis Date  . CHF (congestive heart failure)   . Sarcoidosis   . Cardiomyopathy, dilated, nonischemic     non ischemic by cath  . Acute on chronic systolic heart failure   . Automatic implantable cardiac defibrillator in situ   . Atrial fibrillation   . NSVT (nonsustained ventricular tachycardia)   . GERD (gastroesophageal reflux disease)   . Hypercholesteremia   . Shortness of breath   . Chronic kidney disease (CKD), stage III (moderate)   . Pacemaker   . Anginal pain   . Gout   . Hypertension     dr [name]  . Coronary artery disease   . Elevated PSA 06/24/2013  . Myocardial infarction    Past Surgical History  Procedure Laterality Date  . Pacemaker insertion  2009    with ICD  . Tee without cardioversion  01/17/2011    Procedure: TRANSESOPHAGEAL ECHOCARDIOGRAM (TEE);  Surgeon: Birdie Riddle, MD;  Location: [place];   Service: Cardiovascular;  Laterality: N/A;  . Cardioversion  01/17/2011    Procedure: CARDIOVERSION;  Surgeon: Birdie Riddle, MD;  Location: [place];  Service: Cardiovascular;  Laterality: N/A;  . Cardiac catheterization    . Insert / replace / remove pacemaker    . Anterior cervical decomp/discectomy fusion  08/21/2011    Procedure: ANTERIOR CERVICAL DECOMPRESSION/DISCECTOMY FUSION 2 LEVELS;  Surgeon: Marybelle Killings, MD;  Location: [place];  Service: Orthopedics;  Laterality: N/A;  C5-6, C6-7 Anterior Cervical Discectomy and Fusion, allograft, plate  . Tee without cardioversion N/A 02/16/2012    Procedure: TRANSESOPHAGEAL ECHOCARDIOGRAM (TEE);  Surgeon: Jolaine Artist, MD;  Location: [facility] ENDOSCOPY;  Service: Cardiovascular;  Laterality: N/A;  original case scheduled under his dad (who is deceased), rescheduled under correct mrn/pt/dob. [place]/dl  . Tee without cardioversion N/A 03/22/2012    Procedure: TRANSESOPHAGEAL ECHOCARDIOGRAM (TEE);  Surgeon: Lelon Perla, MD;  Location: [place];  Service: Cardiovascular;  Laterality: N/A;  . Cardioversion N/A 03/22/2012    Procedure: CARDIOVERSION;  Surgeon: Lelon Perla, MD;  Location: [facility] ENDOSCOPY;  Service: Cardiovascular;  Laterality: N/A;  . Cardioversion N/A 04/26/2012    Procedure: CARDIOVERSION;  Surgeon: Jolaine Artist, MD;  Location: [facility] ENDOSCOPY;  Service: Cardiovascular;  Laterality: N/A;  . Central venous catheter tunneled insertion single lumen  09/16/2012    right IJ  . Cardioversion N/A 11/08/2012    Procedure: CARDIOVERSION;  Surgeon: Jolaine Artist, MD;  Location: [facility] ENDOSCOPY;  Service:  Cardiovascular;  Laterality: N/A;  . Back surgery  1987, lower    Ruptured disk repair   Family History  Problem Relation Age of Onset  . Heart disease    . Heart failure    . Stroke    . Anesthesia problems Neg Hx   . Hypotension Neg Hx   . Malignant hyperthermia Neg Hx   . Pseudochol deficiency Neg Hx    History  Substance  Use Topics  . Smoking status: Never Smoker   . Smokeless tobacco: Never Used  . Alcohol Use: No    Review of Systems  All other systems reviewed and are negative.     Allergies  Nitroglycerin  Home Medications   Prior to Admission medications   Medication Sig Start Date End Date Taking? Authorizing Provider  allopurinol (ZYLOPRIM) 100 MG tablet Take 1 tablet (100 mg total) by mouth daily. 02/05/12  Yes Wynetta Emery, PA-C  amiodarone (PACERONE) 200 MG tablet Take 200 mg by mouth 2 (two) times daily.   Yes Historical Provider, MD  apixaban (ELIQUIS) 5 MG TABS tablet Take 1 tablet (5 mg total) by mouth 2 (two) times daily. 10/09/12  Yes Rande Brunt, NP  carvedilol (COREG) 6.25 MG tablet Take 6.25 mg by mouth 2 (two) times daily with a meal.   Yes Historical Provider, MD  eplerenone (INSPRA) 25 MG tablet Take 25 mg by mouth daily. 09/11/13  Yes Rande Brunt, NP  esomeprazole (NEXIUM) 40 MG capsule Take 40 mg by mouth 2 (two) times daily.   Yes Historical Provider, MD  fluticasone (FLONASE) 50 MCG/ACT nasal spray Place 2 sprays into both nostrils daily as needed for allergies.    Yes Historical Provider, MD  hydrOXYzine (ATARAX/VISTARIL) 25 MG tablet Take 1 tablet (25 mg total) by mouth 2 (two) times daily as needed. 09/15/13  Yes Jolaine Artist, MD  LORazepam (ATIVAN) 0.5 MG tablet Take 1-2 tablets (0.5-1 mg total) by mouth 2 (two) times daily as needed for anxiety. 05/19/13  Yes Debbrah Alar, NP  milrinone (PRIMACOR) 20 MG/100ML SOLN infusion Inject 0.375 mcg/kg/min into the vein continuous. $RemoveBeforeDE'440mg'odwpCyuoBbjAczQ$  in 435ml infusion 2.5 ml per hour   Yes Historical Provider, MD  potassium chloride SA (K-DUR,KLOR-CON) 20 MEQ tablet Take 3 tabs in AM and 2 tabs in PM 09/11/13  Yes Rande Brunt, NP  promethazine (PHENERGAN) 25 MG tablet Take 25 mg by mouth every 6 (six) hours as needed for nausea or vomiting.   Yes Historical Provider, MD  sucralfate (CARAFATE) 1 GM/10ML suspension Take 10 mLs (1  g total) by mouth 4 (four) times daily. 09/09/13  Yes Jessica D. Zehr, PA-C  torsemide (DEMADEX) 20 MG tablet Take 20 mg by mouth See admin instructions. Takes three tablet in the morning ($RemoveBefo'60mg'JlrFYJRpDIN$ ) and two tablet in the evening ($RemoveBefo'40mg'adcKVivughr$ )   Yes Historical Provider, MD  Magnesium Oxide 400 (240 MG) MG TABS Take 1 tablet by mouth 2 (two) times daily. 09/11/13   Rande Brunt, NP  metolazone (ZAROXOLYN) 2.5 MG tablet Take 1 tablet (2.5 mg total) by mouth daily as needed (for fluid retention). 08/27/13   Jolaine Artist, MD  MOVIPREP 100 G SOLR Take 1 kit (200 g total) by mouth once. 09/09/13   Janett Billow D. Zehr, PA-C  Oxycodone HCl 20 MG TABS Take 1 tablet by mouth 3 (three) times daily as needed (pain.).    Historical Provider, MD  senna-docusate (SENOKOT-S) 8.6-50 MG per tablet Take 1-2 tablets by mouth 2 (  two) times daily as needed for mild constipation or moderate constipation.    Historical Provider, MD  zolpidem (AMBIEN) 5 MG tablet Take 1 tablet (5 mg total) by mouth at bedtime as needed for sleep. 08/08/13   Debbrah Alar, NP   BP 93/69  Pulse 97  Resp 20  SpO2 97% Physical Exam  Nursing note and vitals reviewed. Constitutional: He is oriented to person, place, and time. He appears well-developed and well-nourished.  HENT:  Head: Normocephalic and atraumatic.  Right Ear: External ear normal.  Left Ear: External ear normal.  Eyes: Conjunctivae and EOM are normal. Pupils are equal, round, and reactive to light.  Neck: Normal range of motion and phonation normal. Neck supple.  Cardiovascular: Normal heart sounds.   Tachycardia, Irregular, hypotensive  Pulmonary/Chest: Effort normal and breath sounds normal. He exhibits no bony tenderness.  Abdominal: Soft. There is no tenderness.  Musculoskeletal: Normal range of motion.  Neurological: He is alert and oriented to person, place, and time. No cranial nerve deficit or sensory deficit. He exhibits normal muscle tone. Coordination normal.  Skin:  Skin is warm, dry and intact.  Psychiatric: He has a normal mood and affect. His behavior is normal. Judgment and thought content normal.    ED Course  Procedures (including critical care time) Medications - No data to display  Patient Vitals for the past 24 hrs:  BP Pulse Resp SpO2  09/18/13 0947 110/67 mmHg 85 16 -  09/18/13 0928 93/69 mmHg 97 20 97 %  09/18/13 0905 91/61 mmHg 118 20 -  09/18/13 0856 103/80 mmHg 117 - -  09/18/13 0845 76/56 mmHg 69 20 98 %  09/18/13 0833 - 51 25 94 %    9:52 AM Reevaluation with update and discussion. After initial assessment and treatment, an updated evaluation reveals he is calm and comfortable. Heart rate is 74. Blood pressure is normal. Review of records indicate he has history of paroxysmal atrial fibrillation, and atrial flutter. He also recalls having PVCs in the past. Patient is alert, denies chest pain, weakness, or dizziness. Findings discussed with patient and all questions answered. [name]   11:45- Calm comfortable, HR 75 on monitor, appears sinus.  CRITICAL CARE Performed by: Richarda Blade Total critical care time: 35 minutes Critical care time was exclusive of separately billable procedures and treating other patients. Critical care was necessary to treat or prevent imminent or life-threatening deterioration. Critical care was time spent personally by me on the following activities: development of treatment plan with patient and/or surrogate as well as nursing, discussions with consultants, evaluation of patient's response to treatment, examination of patient, obtaining history from patient or surrogate, ordering and performing treatments and interventions, ordering and review of laboratory studies, ordering and review of radiographic studies, pulse oximetry and re-evaluation of patient's condition.   Labs Review Labs Reviewed - No data to display  Imaging Review No results found.   EKG Interpretation   Date/Time:   Thursday September 18 2013 08:30:16 EDT Ventricular Rate:  114 PR Interval:  251 QRS Duration: 116 QT Interval:  394 QTC Calculation: 543 R Axis:   -66 Text Interpretation:  Atrial fibrillation vs. Atrial flutter with rapid  ventricular response Ventricular premature complex Consider left atrial  enlargement Left anterior fascicular block Nonspecific T abnormalities,  lateral leads Since last tracing rate is faster, and probably not in Sinus  Rhythm Confirmed by Eulis Foster  MD, Caelyn Route ([id number]) on 09/18/2013 11:39:32 AM       EKG Interpretation  Date/Time:  Thursday September 18 2013 09:14:54 EDT Ventricular Rate:  86 PR Interval:  215 QRS Duration: 121 QT Interval:  434 QTC Calculation: 519 R Axis:   -75 Text Interpretation:  Sinus rhythm Ventricular bigeminy Borderline prolonged PR interval Probable left atrial enlargement Nonspecific IVCD with LAD Nonspecific T abnormalities, lateral leads Since last tracing of earlier today revovery of NSR, now with frequent PVCs Confirmed by [facility]  MD, Keiland Pickering ([id number]) on 09/18/2013 11:41:58 AM        MDM   Final diagnoses:  Transient hypotension  Tachycardia with 141 - 160 beats per minute     Transient hypotension, likely related to an episode of atrial flutter, with heart rate, around 150. Patient improved with IV fluids, and did not develop further symptoms. Heart rate stabilized and followup EKG is normal sinus rhythm. I suspect that this is paroxysmal, and possibly related to n.p.o. status. I doubt that there is metabolic instability serious bacterial infection or impending vascular collapse.   Nursing Notes Reviewed/ Care Coordinated Applicable Imaging Reviewed Interpretation of Laboratory Data incorporated into ED treatment  The patient appears reasonably screened and/or stabilized for discharge and I doubt any other medical condition or other [facility] requiring further screening, evaluation, or treatment in the ED at this time prior to  discharge.  Plan: Home Medications- usual; Home Treatments- rest; return here if the recommended treatment, does not improve the symptoms; Recommended follow up- PCP prn. Reschedule endoscopy    Richarda Blade, MD 09/18/13 1151

## 2013-09-18 NOTE — H&P (View-Only) (Signed)
09/09/2013 [name] [id number] 1950-01-08   HISTORY OF PRESENT ILLNESS:  This is a 63 year old male with multiple medical problems including hypertension, hyperlipidemia, chronic kidney disease stage 2-3, systolic CHF with EF of 97-67%, atrial fibrillation, ICD placement, depression with anxiety, and chronic pain. He is on Eliquis and follows with Dr. Lovena Le and Dr. Haroldine Laws for his heart.  He also takes Marinol via a pump.  He presents to our office today with complaints of epigastric abdominal pain and vomiting. He states that the pain has been present for the past 2-3 years but has gotten progressively worse particularly over the past 6 months or so. He also now complains of vomiting immediately following eating at times. The vomiting is new and he had not been experiencing that in the past. He reports that the pain does come and go, but when it is present it is constant and not necessarily worsened by eating. When the pain is present it will last a few days and then go away for a day or 2 before returning again. The pain does radiate to his left side at times. He complains of abdominal bloating and distention.  He is currently on Nexium 40 mg twice daily and had taken Zantac in the past as well, but is not taking that currently. He had an abdominal ultrasound in August 2015, which showed suggestion of mild gallbladder sludge, mild hepatic steatosis without focal mass, and subcentimeter left renal cyst, but was otherwise unremarkable. He had an EGD in June of 2012 by Dr. Michail Sermon for evaluation of epigastric abdominal pain that was normal at that time.  Recent CBC, lipase, and hepatic function panel were within normal limits. He had a CT scan in January 2014 without contrast for evaluation of epigastric abdominal pain at that time as well, which revealed cardiomegaly with right pleural effusion and a lesion in the upper pole of his right kidney, but was otherwise unremarkable.  He has never  undergone colonoscopy in the past.   Past Medical History  Diagnosis Date  . CHF (congestive heart failure)   . Sarcoidosis   . Cardiomyopathy, dilated, nonischemic     non ischemic by cath  . Acute on chronic systolic heart failure   . Automatic implantable cardiac defibrillator in situ   . Atrial fibrillation   . NSVT (nonsustained ventricular tachycardia)   . GERD (gastroesophageal reflux disease)   . Hypercholesteremia   . Myocardial infarction   . Shortness of breath   . Chronic kidney disease (CKD), stage III (moderate)   . Pacemaker   . Anginal pain   . Gout   . Hypertension     dr [name]  . Coronary artery disease   . Elevated PSA 06/24/2013   Past Surgical History  Procedure Laterality Date  . Back surgery  1987    Ruptured disk repair  . Pacemaker insertion      with ICD  . Tee without cardioversion  01/17/2011    Procedure: TRANSESOPHAGEAL ECHOCARDIOGRAM (TEE);  Surgeon: Birdie Riddle, MD;  Location: [place];  Service: Cardiovascular;  Laterality: N/A;  . Cardioversion  01/17/2011    Procedure: CARDIOVERSION;  Surgeon: Birdie Riddle, MD;  Location: [place];  Service: Cardiovascular;  Laterality: N/A;  . Cardiac catheterization    . Insert / replace / remove pacemaker    . Anterior cervical decomp/discectomy fusion  08/21/2011    Procedure: ANTERIOR CERVICAL DECOMPRESSION/DISCECTOMY FUSION 2 LEVELS;  Surgeon: Marybelle Killings, MD;  Location: [place];  Service: Orthopedics;  Laterality: N/A;  C5-6, C6-7 Anterior Cervical Discectomy and Fusion, allograft, plate  . Tee without cardioversion N/A 02/16/2012    Procedure: TRANSESOPHAGEAL ECHOCARDIOGRAM (TEE);  Surgeon: Jolaine Artist, MD;  Location: [facility] ENDOSCOPY;  Service: Cardiovascular;  Laterality: N/A;  original case scheduled under his dad (who is deceased), rescheduled under correct mrn/pt/dob. [place]/dl  . Tee without cardioversion N/A 03/22/2012    Procedure: TRANSESOPHAGEAL ECHOCARDIOGRAM (TEE);  Surgeon:  Lelon Perla, MD;  Location: [place];  Service: Cardiovascular;  Laterality: N/A;  . Cardioversion N/A 03/22/2012    Procedure: CARDIOVERSION;  Surgeon: Lelon Perla, MD;  Location: [facility] ENDOSCOPY;  Service: Cardiovascular;  Laterality: N/A;  . Cardioversion N/A 04/26/2012    Procedure: CARDIOVERSION;  Surgeon: Jolaine Artist, MD;  Location: [facility] ENDOSCOPY;  Service: Cardiovascular;  Laterality: N/A;  . Central venous catheter tunneled insertion single lumen  09/16/2012    right IJ  . Cardioversion N/A 11/08/2012    Procedure: CARDIOVERSION;  Surgeon: Jolaine Artist, MD;  Location: [facility] ENDOSCOPY;  Service: Cardiovascular;  Laterality: N/A;    reports that he has never smoked. He has never used smokeless tobacco. He reports that he uses illicit drugs ("Crack" cocaine, Marijuana, and Cocaine). He reports that he does not drink alcohol. family history includes Heart disease in an other family member; Heart failure in an other family member; Stroke in an other family member. There is no history of Anesthesia problems, Hypotension, Malignant hyperthermia, or Pseudochol deficiency. Allergies  Allergen Reactions  . Nitroglycerin Other (See Comments)    "feels like head is going to bust open"      Outpatient Encounter Prescriptions as of 09/09/2013  Medication Sig  . allopurinol (ZYLOPRIM) 100 MG tablet Take 1 tablet (100 mg total) by mouth daily.  Marland Kitchen amiodarone (PACERONE) 200 MG tablet Take 200 mg by mouth 2 (two) times daily.  Marland Kitchen apixaban (ELIQUIS) 5 MG TABS tablet Take 1 tablet (5 mg total) by mouth 2 (two) times daily.  . carvedilol (COREG) 3.125 MG tablet Take 3.125 mg by mouth 2 (two) times daily with a meal.  . eplerenone (INSPRA) 25 MG tablet Take 1 tablet (25 mg total) by mouth daily.  Marland Kitchen esomeprazole (NEXIUM) 40 MG capsule Take 1 capsule (40 mg total) by mouth 2 (two) times daily before a meal.  . fluticasone (FLONASE) 50 MCG/ACT nasal spray Place 2 sprays into both nostrils  daily as needed for allergies.   Marland Kitchen LORazepam (ATIVAN) 0.5 MG tablet Take 1-2 tablets (0.5-1 mg total) by mouth 2 (two) times daily as needed for anxiety.  . metolazone (ZAROXOLYN) 2.5 MG tablet Take 1 tablet (2.5 mg total) by mouth daily as needed (for fluid retention).  . milrinone (PRIMACOR) 20 MG/100ML SOLN infusion Inject 0.375 mcg/kg/min into the vein continuous. 440mg  in 458ml infusion 2.5 ml per hour  . NEXIUM 40 MG capsule TAKE 1 CAPSULE EVERY DAY  . Oxycodone HCl 20 MG TABS Take 1 tablet (20 mg total) by mouth 3 (three) times daily as needed.  . potassium chloride SA (K-DUR,KLOR-CON) 20 MEQ tablet Take 60 meq (3 tablets) in the morning and 40 meq (2 tablets) in the evening.  . predniSONE (DELTASONE) 10 MG tablet 6 tabs po day 1, 5 tabs po day 2, 4 tabs po day 3, 3 tabs po day 4, 2 tabs po day 5, 1 tab po day 6  . promethazine (PHENERGAN) 25 MG tablet Take 25 mg by mouth every 6 (  six) hours as needed for nausea or vomiting.  . ranitidine (ZANTAC) 150 MG capsule Take 150 mg by mouth 2 (two) times daily.  Marland Kitchen senna-docusate (SENOKOT-S) 8.6-50 MG per tablet Take 1-2 tablets by mouth 2 (two) times daily as needed for mild constipation or moderate constipation.  Marland Kitchen spironolactone (ALDACTONE) 25 MG tablet   . torsemide (DEMADEX) 20 MG tablet Take 2 tabs in the am and 3 tabs in the pm  . zolpidem (AMBIEN) 5 MG tablet Take 1 tablet (5 mg total) by mouth at bedtime as needed for sleep.  . sucralfate (CARAFATE) 1 GM/10ML suspension Take 10 mLs (1 g total) by mouth 4 (four) times daily.     REVIEW OF SYSTEMS  : All other systems reviewed and negative except where noted in the History of Present Illness.   PHYSICAL EXAM: BP 106/58  Pulse 56  Ht 6' 2.75" (1.899 m)  Wt 243 lb 6.4 oz (110.406 kg)  BMI 30.62 kg/m2 General: Well developed black male in no acute distress Head: Normocephalic and atraumatic Eyes:  Sclerae anicteric, conjunctiva pink. Ears: Normal auditory acuity Lungs: Clear  throughout to auscultation Heart: Slightly bradycardic. Abdomen: Soft, non-distended.  Normal bowel sounds.  Epigastric TTP without R/R/G. Musculoskeletal: Symmetrical with no gross deformities  Skin: No lesions on visible extremities Extremities: No edema  Neurological: Alert oriented x 4, grossly non-focal Psychological:  Alert and cooperative. Normal mood and affect  ASSESSMENT AND PLAN: -Chronic epigastric abdominal pain, but worsening recently and now with nausea and vomiting:  I had a long discussion with the patient and his wife.  I offered a CT scan vs evaluation with another EGD.  Patient elected for EGD despite it being more invasive testing and stated that he "was willing to take the risk".  Will schedule for EGD pending cardiac clearance and approval to hold Eliquis for 48 hours.  If clearance is denied then I explained to the patient that we would contact him to cancel the procedure and schedule CT scan of the abdomen without contrast instead. -Screening colonoscopy:  Patient has never undergone procedure in the past.  He would like to have colonoscopy as well if we get clearance for EGD since we will be sedating him for that procedure anyway.  Will await for cardiac clearance (see above), but will schedule tentatively.     -Multiple medical problems including extensive cardiac history.  *Procedures tentatively scheduled at [facility] hospital with Dr. Ardis Hughs with [facility] pending cardiac clearance and ok to hold Eliquis.

## 2013-09-18 NOTE — ED Notes (Signed)
Ice chips given; pt states feels a lot better; ekg done and reviewed by Dr Eulis Foster; no new orders given

## 2013-09-18 NOTE — ED Notes (Signed)
Pt resting quietly; talking with pharmacy; call bell within reach; iv in progress; tel sinus tach; siderails up bilaterally; no c/o distress

## 2013-09-18 NOTE — Interval H&P Note (Signed)
Prior to sedation, he was found to have HF in 140s. He did not take his BBlocker this AM, or his lasix.  His SBP was 80.  Anesthesiologist felt it was not wise to proceed with sedation.  I agreed.  She will be calling his cardiologist and we are sending him to the ER for evaluation.  His epigastric pains are chronic, we offered and encouraged non-invasive testing options at office visit.   My office will contact him for return visit to discuss further options.

## 2013-09-18 NOTE — ED Notes (Signed)
IV team RN flushed and discontinued patient's PICC line.

## 2013-09-18 NOTE — Discharge Instructions (Signed)
Hypotension  As your heart beats, it forces blood through your arteries. This force is your blood pressure. If your blood pressure is too low for you to go about your normal activities or to support the organs of your body, you have hypotension. Hypotension is also referred to as low blood pressure. When your blood pressure becomes too low, you may not get enough blood to your brain. As a result, you may feel weak, feel lightheaded, or develop a rapid heart rate. In a more severe case, you may faint.  CAUSES  Various conditions can cause hypotension. These include:  · Blood loss.  · Dehydration.  · Heart or endocrine problems.  · Pregnancy.  · Severe infection.  · Not having a well-balanced diet filled with needed nutrients.  · Severe allergic reactions (anaphylaxis).  Some medicines, such as blood pressure medicine or water pills (diuretics), may lower your blood pressure below normal. Sometimes taking too much medicine or taking medicine not as directed can cause hypotension.  TREATMENT   Hospitalization is sometimes required for hypotension if fluid or blood replacement is needed, if time is needed for medicines to wear off, or if further monitoring is needed. Treatment might include changing your diet, changing your medicines (including medicines aimed at raising your blood pressure), and use of support stockings.  HOME CARE INSTRUCTIONS   · Drink enough fluids to keep your urine clear or pale yellow.  · Take your medicines as directed by your health care provider.  · Get up slowly from reclining or sitting positions. This gives your blood pressure a chance to adjust.  · Wear support stockings as directed by your health care provider.  · Maintain a healthy diet by including nutritious food, such as fruits, vegetables, nuts, whole grains, and lean meats.  SEEK MEDICAL CARE IF:  · You have vomiting or diarrhea.  · You have a fever for more than 2-3 days.  · You feel more thirsty than usual.  · You feel weak and  tired.  SEEK IMMEDIATE MEDICAL CARE IF:   · You have chest pain or a fast or irregular heartbeat.  · You have a loss of feeling in some part of your body, or you lose movement in your arms or legs.  · You have trouble speaking.  · You become sweaty or feel lightheaded.  · You faint.  MAKE SURE YOU:   · Understand these instructions.  · Will watch your condition.  · Will get help right away if you are not doing well or get worse.  Document Released: 12/19/2004 Document Revised: 10/09/2012 Document Reviewed: 06/21/2012  Exit[employer]® Patient Information ©2015 [employer]. This information is not intended to replace advice given to you by your health care provider. Make sure you discuss any questions you have with your health care provider.

## 2013-09-18 NOTE — Telephone Encounter (Signed)
The pt is aware that he has an appt for 11/25/13 but will be rescheduled to a sooner appt when we get a cx.

## 2013-09-18 NOTE — Progress Notes (Signed)
Pt taken to the emergency room per Dr. Jillyn Hidden anesthesia due to cardiac changes. Pt is in a-fib with a decompensating B/P of 71/42.

## 2013-09-18 NOTE — ED Notes (Signed)
Pt with Hx of CHF and CKD reports to ED from endoscopy for hypotension. Pt was given labetalol at endoscopy and staff noted hypotension and tachycardia with HR 150. On arrival to ED, pt's blood pressure measured 75/34. Pt placed in trendelenburg and 250 mL fluid administered, blood pressure raised to 108/90. Pt denies pain.

## 2013-09-18 NOTE — ED Notes (Signed)
Pt resting quietly; iv patent via pump; tel sinus rhythm with occasional pvc rate 87; call bell within reach

## 2013-09-22 ENCOUNTER — Ambulatory Visit: Admitting: Family

## 2013-09-25 ENCOUNTER — Encounter: Payer: Self-pay | Admitting: Internal Medicine

## 2013-10-01 ENCOUNTER — Telehealth: Payer: Self-pay | Admitting: Internal Medicine

## 2013-10-01 ENCOUNTER — Telehealth (HOSPITAL_COMMUNITY): Payer: Self-pay

## 2013-10-01 NOTE — Telephone Encounter (Signed)
Pt states that he's having more episodes of increased HRs w/sxms of dizziness, SOB, and fatigue. He states that although he feels fine at present he had an episode where his HR was in the 140s for a great deal of time earlier today. Pt says that he does not have the equipment to send a remote. Will get pt in to see GT. Deferred to Lorenda Hatchet for scheduling.

## 2013-10-01 NOTE — Telephone Encounter (Signed)
New problem   Pt keeps going to afib to aflutter and was told by Dr Haroldine Laws to call our office. Please call pt.

## 2013-10-01 NOTE — Telephone Encounter (Signed)
Stephanie [facility] RN with [facility] called about patient being in and out of fast rhythm.  Thinks this may be afib, his HR goes from 70-130s, patient can feel himself in and out of "funky rhythm".  Weight stable, no other s/s of CHF exacerbation.  Called patient, states he feels fine now, "my heart feels normal now".  Patient states he thinks his battery life on his devise is running low.  Advised to see if he can get in with Dr. Lovena Le for device check, if not will see what we can do from our standpoint.

## 2013-10-01 NOTE — Telephone Encounter (Signed)
[name] Pain, RN at 10/01/2013 3:25 PM     Status: Signed        Col[place]ta [facility] RN with [facility] called about patient being in and out of fast rhythm. Thinks this may be afib, his HR goes from 70-130s, patient can feel himself in and out of "funky rhythm". Weight stable, no other s/s of CHF exacerbation. Called patient, states he feels fine now, "my heart feels normal now". Patient states he thinks his battery life on his devise is running low. Advised to see if he can get in with Dr. Lovena Le for device check, if not will see what we can do from our standpoint.   Will forward to device clinic to see if they can get a remote or bring in to see what his rhythm is and how fast he is going.  Dr Lovena Le here to day and can show to him if needed

## 2013-10-03 ENCOUNTER — Ambulatory Visit: Payer: Medicare Other | Admitting: Family

## 2013-10-03 NOTE — Telephone Encounter (Signed)
Open in error

## 2013-10-06 ENCOUNTER — Other Ambulatory Visit: Payer: Self-pay

## 2013-10-07 ENCOUNTER — Telehealth: Payer: Self-pay

## 2013-10-07 ENCOUNTER — Encounter: Payer: Self-pay | Admitting: Internal Medicine

## 2013-10-07 ENCOUNTER — Ambulatory Visit (INDEPENDENT_AMBULATORY_CARE_PROVIDER_SITE_OTHER): Payer: Medicare Other | Admitting: Internal Medicine

## 2013-10-07 VITALS — BP 106/78 | HR 108 | Ht 76.0 in | Wt 252.2 lb

## 2013-10-07 DIAGNOSIS — R0602 Shortness of breath: Secondary | ICD-10-CM

## 2013-10-07 DIAGNOSIS — I472 Ventricular tachycardia: Secondary | ICD-10-CM

## 2013-10-07 DIAGNOSIS — I420 Dilated cardiomyopathy: Secondary | ICD-10-CM

## 2013-10-07 DIAGNOSIS — I429 Cardiomyopathy, unspecified: Secondary | ICD-10-CM

## 2013-10-07 DIAGNOSIS — I4729 Other ventricular tachycardia: Secondary | ICD-10-CM

## 2013-10-07 DIAGNOSIS — Z95810 Presence of automatic (implantable) cardiac defibrillator: Secondary | ICD-10-CM

## 2013-10-07 DIAGNOSIS — I5022 Chronic systolic (congestive) heart failure: Secondary | ICD-10-CM

## 2013-10-07 LAB — MDC_IDC_ENUM_SESS_TYPE_INCLINIC
Brady Statistic RV Percent Paced: 0.16 %
Date Time Interrogation Session: 20151006093047
HighPow Impedance: 54 Ohm
Lead Channel Impedance Value: 612.5 Ohm
Lead Channel Pacing Threshold Pulse Width: 0.6 ms
Lead Channel Sensing Intrinsic Amplitude: 12 mV
Lead Channel Setting Sensing Sensitivity: 0.3 mV
MDC IDC MSMT BATTERY REMAINING LONGEVITY: 16.8 mo
MDC IDC MSMT BATTERY VOLTAGE: 2.54 V
MDC IDC MSMT LEADCHNL RV PACING THRESHOLD AMPLITUDE: 1 V
MDC IDC MSMT LEADCHNL RV PACING THRESHOLD AMPLITUDE: 1 V
MDC IDC MSMT LEADCHNL RV PACING THRESHOLD PULSEWIDTH: 0.6 ms
MDC IDC PG SERIAL: 521670
MDC IDC SET LEADCHNL RV PACING AMPLITUDE: 2.5 V
MDC IDC SET LEADCHNL RV PACING PULSEWIDTH: 0.6 ms
Zone Setting Detection Interval: 250 ms
Zone Setting Detection Interval: 300 ms

## 2013-10-07 NOTE — Assessment & Plan Note (Signed)
He has had no recurrent ventricular arrhythmias. He will continue amiodarone 400 mg daily.

## 2013-10-07 NOTE — Assessment & Plan Note (Signed)
His chronic systolic heart failure is class IIIB despite inotropic support. He may require early followup in the heart failure clinic. Suspect his long-term prognosis will be very poor unless he receives a left ventricular assist device, as he has previously not been thought to be a cardiac transplant candidate secondary to renal insufficiency.

## 2013-10-07 NOTE — Assessment & Plan Note (Signed)
His [employer] ICD is working normally. We'll plan to recheck in several months. 

## 2013-10-07 NOTE — Progress Notes (Signed)
HPI [name] returns today for followup. He is a very pleasant 63 year old man with a long-standing nonischemic cardiomyopathy, chronic class III congestive heart failure, requiring inotropic support. I saw the patient 2 months ago and at that time he had a single ICD shock secondary to ventricular fibrillation. He had been bothered by fatigue and weakness. He was continued on 400 mg of amiodarone daily. In the interim, he dizziness as well as shortness of breath and chest pressure. He has not received an ICD shock. He denies syncope. He had to take metolazone several days ago because of weight gain. Allergies  Allergen Reactions  . Nitroglycerin Other (See Comments)    "feels like head is going to bust open"     Current Outpatient Prescriptions  Medication Sig Dispense Refill  . allopurinol (ZYLOPRIM) 100 MG tablet Take 1 tablet (100 mg total) by mouth daily.  30 tablet  6  . amiodarone (PACERONE) 200 MG tablet Take 300 mg by mouth 2 (two) times daily.       Marland Kitchen apixaban (ELIQUIS) 5 MG TABS tablet Take 1 tablet (5 mg total) by mouth 2 (two) times daily.  60 tablet  4  . carvedilol (COREG) 6.25 MG tablet Take 6.25 mg by mouth 2 (two) times daily with a meal.      . eplerenone (INSPRA) 25 MG tablet Take 25 mg by mouth daily.      Marland Kitchen esomeprazole (NEXIUM) 40 MG capsule Take 40 mg by mouth 2 (two) times daily.      . fluticasone (FLONASE) 50 MCG/ACT nasal spray Place 2 sprays into both nostrils daily as needed for allergies.       . hydrOXYzine (ATARAX/VISTARIL) 25 MG tablet Take 1 tablet (25 mg total) by mouth 2 (two) times daily as needed.  60 tablet  2  . LORazepam (ATIVAN) 0.5 MG tablet Take 1-2 tablets (0.5-1 mg total) by mouth 2 (two) times daily as needed for anxiety.  50 tablet  0  . Magnesium Oxide 400 (240 MG) MG TABS Take 1 tablet by mouth 2 (two) times daily.  60 tablet  3  . metolazone (ZAROXOLYN) 2.5 MG tablet Take 1 tablet (2.5 mg total) by mouth daily as needed (for fluid  retention).  10 tablet  6  . milrinone (PRIMACOR) 20 MG/100ML SOLN infusion Inject 0.375 mcg/kg/min into the vein continuous. $RemoveBeforeDE'440mg'CmLzZdKqrNKylCG$  in 478ml infusion 2.5 ml per hour      . Oxycodone HCl 20 MG TABS Take 1 tablet by mouth 3 (three) times daily as needed (pain.).      Marland Kitchen potassium chloride SA (K-DUR,KLOR-CON) 20 MEQ tablet Take 4 tabs in AM and 4 tabs in PM      . promethazine (PHENERGAN) 25 MG tablet Take 25 mg by mouth every 6 (six) hours as needed for nausea or vomiting.      . senna-docusate (SENOKOT-S) 8.6-50 MG per tablet Take 1-2 tablets by mouth 2 (two) times daily as needed for mild constipation or moderate constipation.      . sucralfate (CARAFATE) 1 GM/10ML suspension Take 10 mLs (1 g total) by mouth 4 (four) times daily.  420 mL  1  . torsemide (DEMADEX) 20 MG tablet Take 20 mg by mouth See admin instructions. Takes three tablet in the morning ($RemoveBefo'60mg'dWyYcLmLGuV$ ) and two tablet in the evening ($RemoveBefo'40mg'lxfnjTZkHhS$ )      . zolpidem (AMBIEN) 5 MG tablet Take 1 tablet (5 mg total) by mouth at bedtime as needed for sleep.  30 tablet  0  . MOVIPREP 100 G SOLR Take 1 kit (200 g total) by mouth once.  1 kit  0   No current facility-administered medications for this visit.     Past Medical History  Diagnosis Date  . CHF (congestive heart failure)   . Sarcoidosis   . Cardiomyopathy, dilated, nonischemic     non ischemic by cath  . Acute on chronic systolic heart failure   . Automatic implantable cardiac defibrillator in situ   . Atrial fibrillation   . NSVT (nonsustained ventricular tachycardia)   . GERD (gastroesophageal reflux disease)   . Hypercholesteremia   . Shortness of breath   . Chronic kidney disease (CKD), stage III (moderate)   . Pacemaker   . Anginal pain   . Gout   . Hypertension     dr [name]  . Coronary artery disease   . Elevated PSA 06/24/2013  . Myocardial infarction     ROS:   All systems reviewed and negative except as noted in the HPI.   Past Surgical History  Procedure  Laterality Date  . Pacemaker insertion  2009    with ICD  . Tee without cardioversion  01/17/2011    Procedure: TRANSESOPHAGEAL ECHOCARDIOGRAM (TEE);  Surgeon: Birdie Riddle, MD;  Location: [place];  Service: Cardiovascular;  Laterality: N/A;  . Cardioversion  01/17/2011    Procedure: CARDIOVERSION;  Surgeon: Birdie Riddle, MD;  Location: [place];  Service: Cardiovascular;  Laterality: N/A;  . Cardiac catheterization    . Insert / replace / remove pacemaker    . Anterior cervical decomp/discectomy fusion  08/21/2011    Procedure: ANTERIOR CERVICAL DECOMPRESSION/DISCECTOMY FUSION 2 LEVELS;  Surgeon: Marybelle Killings, MD;  Location: [place];  Service: Orthopedics;  Laterality: N/A;  C5-6, C6-7 Anterior Cervical Discectomy and Fusion, allograft, plate  . Tee without cardioversion N/A 02/16/2012    Procedure: TRANSESOPHAGEAL ECHOCARDIOGRAM (TEE);  Surgeon: Jolaine Artist, MD;  Location: [facility] ENDOSCOPY;  Service: Cardiovascular;  Laterality: N/A;  original case scheduled under his dad (who is deceased), rescheduled under correct mrn/pt/dob. [place]/dl  . Tee without cardioversion N/A 03/22/2012    Procedure: TRANSESOPHAGEAL ECHOCARDIOGRAM (TEE);  Surgeon: Lelon Perla, MD;  Location: [place];  Service: Cardiovascular;  Laterality: N/A;  . Cardioversion N/A 03/22/2012    Procedure: CARDIOVERSION;  Surgeon: Lelon Perla, MD;  Location: [facility] ENDOSCOPY;  Service: Cardiovascular;  Laterality: N/A;  . Cardioversion N/A 04/26/2012    Procedure: CARDIOVERSION;  Surgeon: Jolaine Artist, MD;  Location: [facility] ENDOSCOPY;  Service: Cardiovascular;  Laterality: N/A;  . Central venous catheter tunneled insertion single lumen  09/16/2012    right IJ  . Cardioversion N/A 11/08/2012    Procedure: CARDIOVERSION;  Surgeon: Jolaine Artist, MD;  Location: [facility] ENDOSCOPY;  Service: Cardiovascular;  Laterality: N/A;  . Back surgery  1987, lower    Ruptured disk repair     Family History  Problem Relation  Age of Onset  . Heart disease    . Heart failure    . Stroke    . Anesthesia problems Neg Hx   . Hypotension Neg Hx   . Malignant hyperthermia Neg Hx   . Pseudochol deficiency Neg Hx      History   Social History  . Marital Status: Single    Spouse Name: N/A    Number of Children: N/A  . Years of Education: N/A   Occupational History  . Not on file.   Social  History Main Topics  . Smoking status: Never Smoker   . Smokeless tobacco: Never Used  . Alcohol Use: No  . Drug Use: Yes    Special: "Crack" cocaine, Cocaine     Comment: last use august 2004  . Sexual Activity: Not Currently   Other Topics Concern  . Not on file   Social History Narrative   3 sons- one in [place], 1 in [place], on in [place]   Retired from post office- on disability   Completed Bachelors from [employer]     BP 106/78  Pulse 108  Ht $R'6\' 4"'hh$  (1.93 m)  Wt 252 lb 3.2 oz (114.397 kg)  BMI 30.71 kg/m2  Physical Exam:  Stable appearing middle-aged man, NAD HEENT: Unremarkable Neck:  7 cm JVD, no thyromegally Back:  No CVA tenderness Lungs:  Clear except for basilar rales HEART:  Regular rate rhythm, no murmurs, no rubs, no clicks, soft S3 is present Abd:  soft, positive bowel sounds, no organomegally, no rebound, no guarding Ext:  2 plus pulses, no edema, no cyanosis, no clubbing Skin:  No rashes no nodules Neuro:  CN II through XII intact, motor grossly intact  EKG - sinus tachycardia with frequent PVCs  DEVICE  Normal device function.  See PaceArt for details.   Assess/Plan:

## 2013-10-07 NOTE — Patient Instructions (Signed)
Your physician recommends that you continue on your current medications as directed. Please refer to the Current Medication list given to you today.  Your physician wants you to follow-up in: 1 year with Dr. Taylor.  You will receive a reminder letter in the mail two months in advance. If you don't receive a letter, please call our office to schedule the follow-up appointment.  

## 2013-10-07 NOTE — Telephone Encounter (Signed)
Spoke with patient who stated that he has had an eye exam within the last year.  He last received his eye exam at the [facility].  He is also scheduled to have an eye exam on 10/17/13.  Appointment verified by [facility].

## 2013-10-08 ENCOUNTER — Encounter: Payer: Self-pay | Admitting: Anesthesiology

## 2013-10-08 ENCOUNTER — Ambulatory Visit (INDEPENDENT_AMBULATORY_CARE_PROVIDER_SITE_OTHER): Payer: Medicare Other | Admitting: Family

## 2013-10-08 ENCOUNTER — Encounter: Payer: Self-pay | Admitting: Family

## 2013-10-08 ENCOUNTER — Encounter: Payer: Self-pay | Admitting: Internal Medicine

## 2013-10-08 VITALS — BP 102/80 | HR 92 | Temp 98.2°F | Resp 20 | Ht 75.0 in | Wt 251.0 lb

## 2013-10-08 DIAGNOSIS — R739 Hyperglycemia, unspecified: Secondary | ICD-10-CM

## 2013-10-08 DIAGNOSIS — R1013 Epigastric pain: Secondary | ICD-10-CM

## 2013-10-08 DIAGNOSIS — G8929 Other chronic pain: Secondary | ICD-10-CM

## 2013-10-08 DIAGNOSIS — F418 Other specified anxiety disorders: Secondary | ICD-10-CM

## 2013-10-08 DIAGNOSIS — I5022 Chronic systolic (congestive) heart failure: Secondary | ICD-10-CM

## 2013-10-08 DIAGNOSIS — Z23 Encounter for immunization: Secondary | ICD-10-CM

## 2013-10-08 LAB — HEMOGLOBIN A1C: Hgb A1c MFr Bld: 6.5 % (ref 4.6–6.5)

## 2013-10-08 NOTE — Patient Instructions (Signed)
Please complete lab work prior to leaving. Follow up in 3 months, sooner if problems/concerns.

## 2013-10-08 NOTE — Progress Notes (Signed)
Pre visit review using our clinic review tool, if applicable. No additional management support is needed unless otherwise documented below in the visit note. 

## 2013-10-08 NOTE — Progress Notes (Signed)
Subjective:    Patient ID: [name], male    DOB: 1950/12/16, 63 y.o.   MRN: [id number]  HPI  [name] is a 63 yr old male who presents today for follow up.  1) Hyperglycemia- denies polyuria/polydipsia.  Lab Results  Component Value Date   HGBA1C 6.5* 06/23/2013    3) Depression/anxiety- using ativan prn.  Reports depression is well controlled. Has not needed for several weeks.  Has needed Lorrin Mais more recently.    4) Abdominal pain- reports that patient continues to have abdominal pain.  GI has postponed EGD due to episode of hypotension and tachycardia which occurred while in the endoscopy suite and ultimately resulted in pt being sent to the ED.  It was felt that tachycardia was likely secondary atrial flutter. Pt is maintained on amiodarone per cardiologist.  5) CHF- class 3-4- He is followed by Dr. Lovena Le (saw him yesterday). He is felt to have an overall poor prognosis.   Review of Systems See HPI  Past Medical History  Diagnosis Date  . CHF (congestive heart failure)   . Sarcoidosis   . Cardiomyopathy, dilated, nonischemic     non ischemic by cath  . Acute on chronic systolic heart failure   . Automatic implantable cardiac defibrillator in situ   . Atrial fibrillation   . NSVT (nonsustained ventricular tachycardia)   . GERD (gastroesophageal reflux disease)   . Hypercholesteremia   . Shortness of breath   . Chronic kidney disease (CKD), stage III (moderate)   . Pacemaker   . Anginal pain   . Gout   . Hypertension     dr [name]  . Coronary artery disease   . Elevated PSA 06/24/2013  . Myocardial infarction     History   Social History  . Marital Status: Single    Spouse Name: N/A    Number of Children: N/A  . Years of Education: N/A   Occupational History  . Not on file.   Social History Main Topics  . Smoking status: Never Smoker   . Smokeless tobacco: Never Used  . Alcohol Use: No  . Drug Use: Yes    Special: "Crack" cocaine,  Cocaine     Comment: last use august 2004  . Sexual Activity: Not Currently   Other Topics Concern  . Not on file   Social History Narrative   3 sons- one in [place], 1 in [place], on in [place]   Retired from post office- on disability   Completed Bachelors from [employer]    Past Surgical History  Procedure Laterality Date  . Pacemaker insertion  2009    with ICD  . Tee without cardioversion  01/17/2011    Procedure: TRANSESOPHAGEAL ECHOCARDIOGRAM (TEE);  Surgeon: Birdie Riddle, MD;  Location: [place];  Service: Cardiovascular;  Laterality: N/A;  . Cardioversion  01/17/2011    Procedure: CARDIOVERSION;  Surgeon: Birdie Riddle, MD;  Location: [place];  Service: Cardiovascular;  Laterality: N/A;  . Cardiac catheterization    . Insert / replace / remove pacemaker    . Anterior cervical decomp/discectomy fusion  08/21/2011    Procedure: ANTERIOR CERVICAL DECOMPRESSION/DISCECTOMY FUSION 2 LEVELS;  Surgeon: Marybelle Killings, MD;  Location: [place];  Service: Orthopedics;  Laterality: N/A;  C5-6, C6-7 Anterior Cervical Discectomy and Fusion, allograft, plate  . Tee without cardioversion N/A 02/16/2012    Procedure: TRANSESOPHAGEAL ECHOCARDIOGRAM (TEE);  Surgeon: Jolaine Artist, MD;  Location: [place];  Service: Cardiovascular;  Laterality: N/A;  original case scheduled under his dad (who is deceased), rescheduled under correct mrn/pt/dob. [place]/dl  . Tee without cardioversion N/A 03/22/2012    Procedure: TRANSESOPHAGEAL ECHOCARDIOGRAM (TEE);  Surgeon: Lelon Perla, MD;  Location: [place];  Service: Cardiovascular;  Laterality: N/A;  . Cardioversion N/A 03/22/2012    Procedure: CARDIOVERSION;  Surgeon: Lelon Perla, MD;  Location: [facility] ENDOSCOPY;  Service: Cardiovascular;  Laterality: N/A;  . Cardioversion N/A 04/26/2012    Procedure: CARDIOVERSION;  Surgeon: Jolaine Artist, MD;  Location: [facility] ENDOSCOPY;  Service: Cardiovascular;  Laterality: N/A;  . Central venous catheter tunneled  insertion single lumen  09/16/2012    right IJ  . Cardioversion N/A 11/08/2012    Procedure: CARDIOVERSION;  Surgeon: Jolaine Artist, MD;  Location: [facility] ENDOSCOPY;  Service: Cardiovascular;  Laterality: N/A;  . Back surgery  1987, lower    Ruptured disk repair    Family History  Problem Relation Age of Onset  . Heart disease    . Heart failure    . Stroke    . Anesthesia problems Neg Hx   . Hypotension Neg Hx   . Malignant hyperthermia Neg Hx   . Pseudochol deficiency Neg Hx     Allergies  Allergen Reactions  . Nitroglycerin Other (See Comments)    "feels like head is going to bust open"    Current Outpatient Prescriptions on File Prior to Visit  Medication Sig Dispense Refill  . allopurinol (ZYLOPRIM) 100 MG tablet Take 1 tablet (100 mg total) by mouth daily.  30 tablet  6  . amiodarone (PACERONE) 200 MG tablet Take 300 mg by mouth 2 (two) times daily.       Marland Kitchen apixaban (ELIQUIS) 5 MG TABS tablet Take 1 tablet (5 mg total) by mouth 2 (two) times daily.  60 tablet  4  . carvedilol (COREG) 6.25 MG tablet Take 6.25 mg by mouth 2 (two) times daily with a meal.      . eplerenone (INSPRA) 25 MG tablet Take 25 mg by mouth daily.      Marland Kitchen esomeprazole (NEXIUM) 40 MG capsule Take 40 mg by mouth 2 (two) times daily.      . fluticasone (FLONASE) 50 MCG/ACT nasal spray Place 2 sprays into both nostrils daily as needed for allergies.       . hydrOXYzine (ATARAX/VISTARIL) 25 MG tablet Take 1 tablet (25 mg total) by mouth 2 (two) times daily as needed.  60 tablet  2  . LORazepam (ATIVAN) 0.5 MG tablet Take 1-2 tablets (0.5-1 mg total) by mouth 2 (two) times daily as needed for anxiety.  50 tablet  0  . Magnesium Oxide 400 (240 MG) MG TABS Take 1 tablet by mouth 2 (two) times daily.  60 tablet  3  . metolazone (ZAROXOLYN) 2.5 MG tablet Take 1 tablet (2.5 mg total) by mouth daily as needed (for fluid retention).  10 tablet  6  . milrinone (PRIMACOR) 20 MG/100ML SOLN infusion Inject 0.375  mcg/kg/min into the vein continuous. $RemoveBeforeDE'440mg'ywdtOKLZHbiDPsZ$  in 426ml infusion 2.5 ml per hour      . MOVIPREP 100 G SOLR Take 1 kit (200 g total) by mouth once.  1 kit  0  . Oxycodone HCl 20 MG TABS Take 1 tablet by mouth 3 (three) times daily as needed (pain.).      Marland Kitchen potassium chloride SA (K-DUR,KLOR-CON) 20 MEQ tablet Take 4 tabs in AM and 4 tabs in PM      .  promethazine (PHENERGAN) 25 MG tablet Take 25 mg by mouth every 6 (six) hours as needed for nausea or vomiting.      . senna-docusate (SENOKOT-S) 8.6-50 MG per tablet Take 1-2 tablets by mouth 2 (two) times daily as needed for mild constipation or moderate constipation.      . sucralfate (CARAFATE) 1 GM/10ML suspension Take 10 mLs (1 g total) by mouth 4 (four) times daily.  420 mL  1  . torsemide (DEMADEX) 20 MG tablet Take 20 mg by mouth See admin instructions. Takes three tablet in the morning ($RemoveBefo'60mg'espQuHUGZal$ ) and two tablet in the evening ($RemoveBefo'40mg'GwMlqHLNcoF$ )      . zolpidem (AMBIEN) 5 MG tablet Take 1 tablet (5 mg total) by mouth at bedtime as needed for sleep.  30 tablet  0   No current facility-administered medications on file prior to visit.    BP 102/80  Pulse 92  Temp(Src) 98.2 F (36.8 C) (Oral)  Resp 20  Ht $R'6\' 3"'TR$  (1.905 m)  Wt 251 lb (113.853 kg)  BMI 31.37 kg/m2  SpO2 98%       Objective:   Physical Exam  Constitutional: He is oriented to person, place, and time. He appears well-developed and well-nourished. No distress.  Cardiovascular: Normal rate and regular rhythm.   No murmur heard. Pulmonary/Chest: Effort normal and breath sounds normal. No respiratory distress. He has no wheezes. He has no rales. He exhibits no tenderness.  Abdominal: Soft. Bowel sounds are normal. There is no tenderness.  Neurological: He is alert and oriented to person, place, and time.  Psychiatric: He has a normal mood and affect. His behavior is normal. Judgment and thought content normal.          Assessment & Plan:

## 2013-10-11 IMAGING — XA DG FLUORO GUIDE CV LINE
1 series · 2 of 2 positions shown · non-contrast
Comparison: none

CLINICAL DATA: Chronic systolic   heart failure, with continuous
infusion.  Previous PICC line inadvertently removed.

PICC PLACEMENT WITH ULTRASOUND AND FLUOROSCOPY
TECHNIQUE: After written informed consent was obtained, patient was
placed in the supine position on angiographic table. Patency of the
right basilic vein was confirmed with ultrasound with image
documentation. An appropriate skin site was determined. Skin site
was marked. Region was prepped using maximum barrier technique
including cap and mask, sterile gown, sterile gloves, large sterile
sheet, and Chlorhexidine   as cutaneous antisepsis.  The region was
infiltrated locally with 1% lidocaine.   Under real-time ultrasound
guidance, the right basilic vein was accessed with a 21 gauge
micropuncture needle; the needle tip within the vein was confirmed
with ultrasound image documentation.   Needle exchanged over a 018
guidewire for a peel-away sheath, through which a 5-French double-
lumen power injectable PICC trimmed to 43cm was advanced,
positioned with its tip near the cavoatrial junction.  Spot chest
radiograph confirms appropriate catheter position.  Catheter was
flushed per protocol and secured externally with 0-Prolene sutures.
The patient tolerated procedure well, with no immediate
complication.
Fluoroscopy time: 6 seconds

[Series 1: fl cto · 2 of 2 slices shown]
[im 1/2]
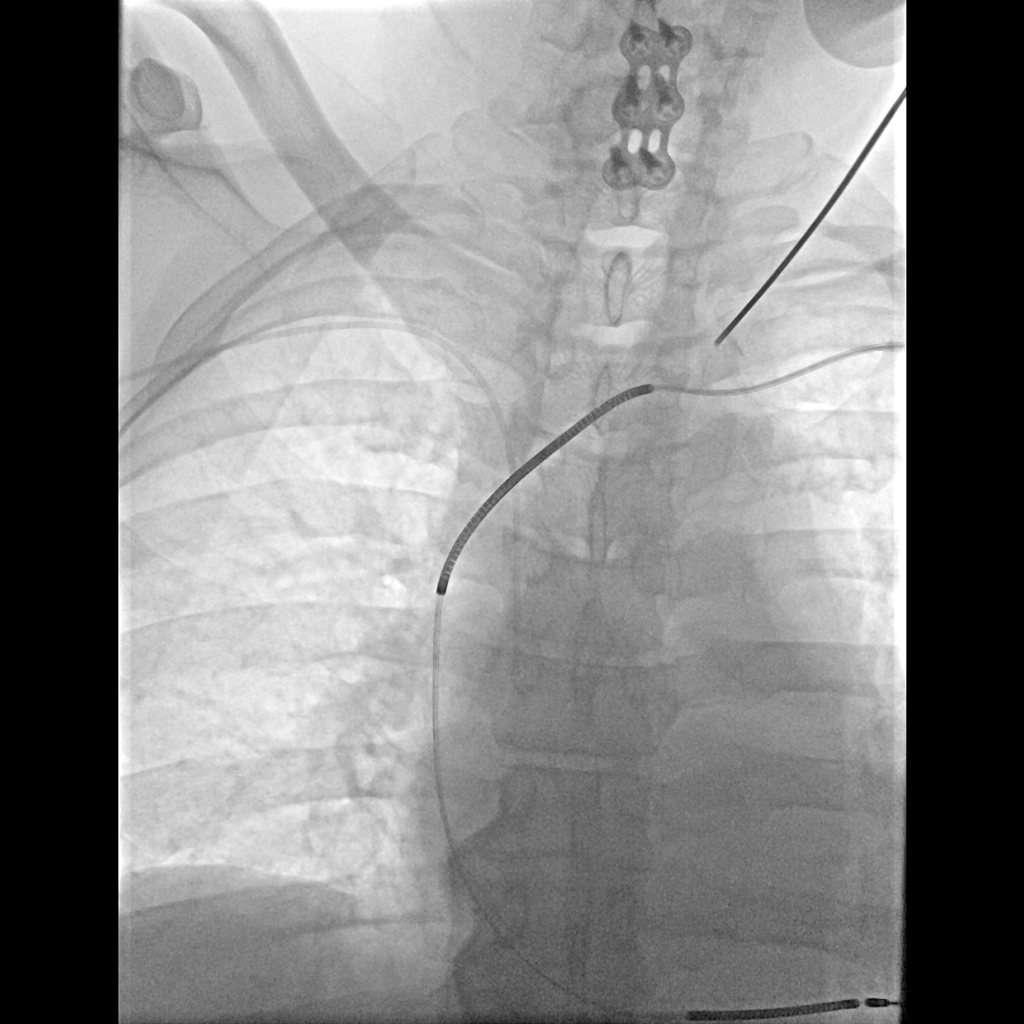
[im 2/2]
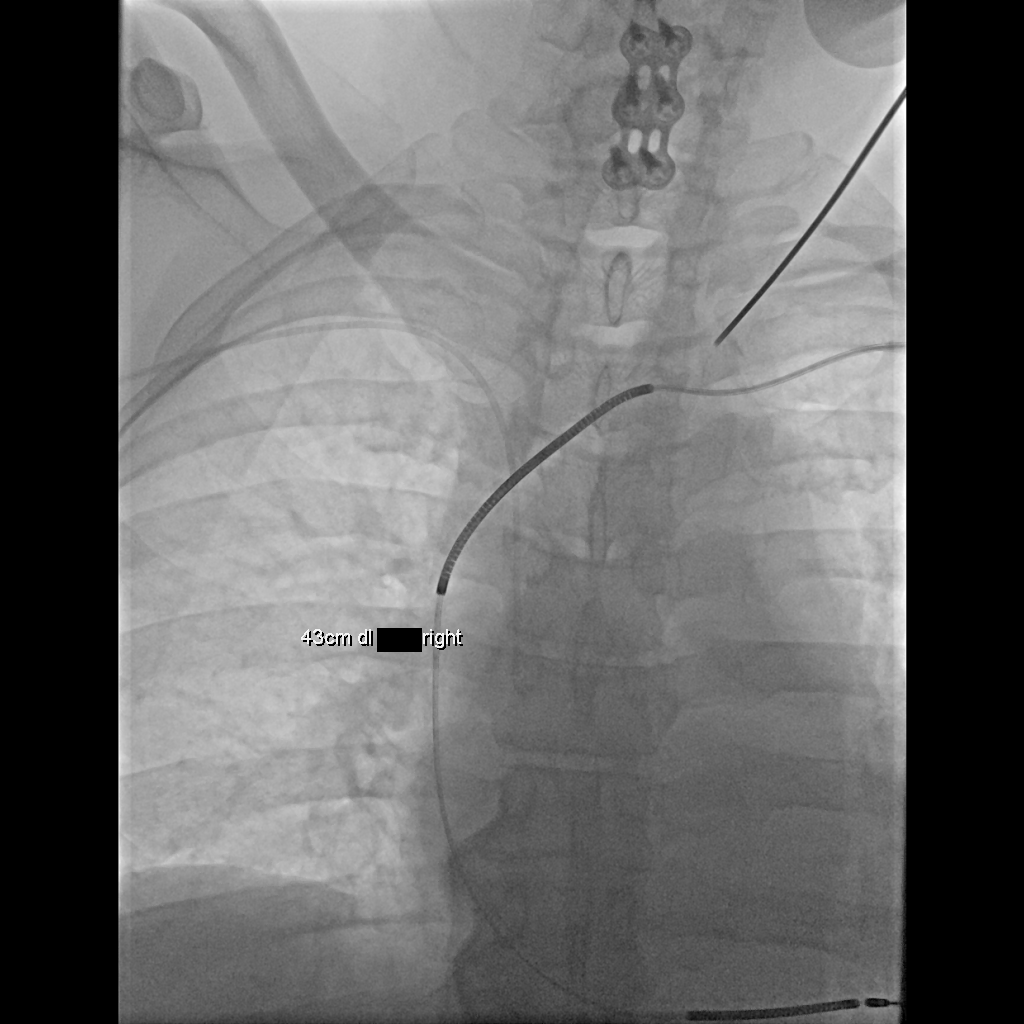

[2 of 2 positions shown; findings below may reference images not displayed]

IMPRESSION: Technically successful five French double lumen power injectable
PICC placement

## 2013-10-12 DIAGNOSIS — R739 Hyperglycemia, unspecified: Secondary | ICD-10-CM | POA: Insufficient documentation

## 2013-10-12 NOTE — Assessment & Plan Note (Signed)
Unchanged.  Colo to be re-ordered by GI.  Management per GI.

## 2013-10-12 NOTE — Assessment & Plan Note (Signed)
Lab Results  Component Value Date   HGBA1C 6.5 10/08/2013   A1C stable, continue diabetic diet.

## 2013-10-12 NOTE — Assessment & Plan Note (Signed)
Stable with prn ativan, continue same.

## 2013-10-12 NOTE — Assessment & Plan Note (Signed)
Unchanged, managed by cardiology.

## 2013-10-14 ENCOUNTER — Encounter: Payer: Self-pay | Admitting: Gastroenterology

## 2013-10-14 ENCOUNTER — Ambulatory Visit (INDEPENDENT_AMBULATORY_CARE_PROVIDER_SITE_OTHER): Payer: Medicare Other | Admitting: Gastroenterology

## 2013-10-14 VITALS — BP 120/90 | HR 80 | Ht 75.0 in | Wt 249.8 lb

## 2013-10-14 DIAGNOSIS — R1013 Epigastric pain: Secondary | ICD-10-CM

## 2013-10-14 NOTE — Progress Notes (Signed)
Review of pertinent gastrointestinal problems: 1. Intermittent vomiting, abdominal pain;  abdominal ultrasound in August 2015, which showed suggestion of mild gallbladder sludge, mild hepatic steatosis without focal mass, and subcentimeter left renal cyst, but was otherwise unremarkable. He had an EGD in June of 2012 by Dr. Michail Sermon for evaluation of epigastric abdominal pain that was normal at that time. Recent CBC, lipase, and hepatic function panel were within normal limits. He had a CT scan in January 2014 without contrast for evaluation of epigastric abdominal pain;  He was set to have colonoscopy, EGD 09/2013 Dr. Ardis Hughs at [facility] but presented after his prep hypotensive with HR 110-130s.  Procedure was cancelled. 2. Chronic systolic heart failure IIIB (per cardiology note 10/2013) despite inotropic support EF 20-25%.     HPI: This is a   very pleasant 63 year old man whom I last saw [facility] when we are canceling his elective colonoscopy upper endoscopy due to hypotension, racing heart.  Knot it epigastrium, burning, hurting to left side.  Bloated after any food intake.  Fills up when he eats.  Will vomit occasionally.  Overall weight stable.  No stomach cancers.   Has had these issues for many years.  Has tried gas ex.  Past Medical History  Diagnosis Date  . CHF (congestive heart failure)   . Sarcoidosis   . Cardiomyopathy, dilated, nonischemic     non ischemic by cath  . Acute on chronic systolic heart failure   . Automatic implantable cardiac defibrillator in situ   . Atrial fibrillation   . NSVT (nonsustained ventricular tachycardia)   . GERD (gastroesophageal reflux disease)   . Hypercholesteremia   . Shortness of breath   . Chronic kidney disease (CKD), stage III (moderate)   . Pacemaker   . Anginal pain   . Gout   . Hypertension     dr [name]  . Coronary artery disease   . Elevated PSA 06/24/2013  . Myocardial infarction     Past Surgical History   Procedure Laterality Date  . Pacemaker insertion  2009    with ICD  . Tee without cardioversion  01/17/2011    Procedure: TRANSESOPHAGEAL ECHOCARDIOGRAM (TEE);  Surgeon: Birdie Riddle, MD;  Location: [place];  Service: Cardiovascular;  Laterality: N/A;  . Cardioversion  01/17/2011    Procedure: CARDIOVERSION;  Surgeon: Birdie Riddle, MD;  Location: [place];  Service: Cardiovascular;  Laterality: N/A;  . Cardiac catheterization    . Insert / replace / remove pacemaker    . Anterior cervical decomp/discectomy fusion  08/21/2011    Procedure: ANTERIOR CERVICAL DECOMPRESSION/DISCECTOMY FUSION 2 LEVELS;  Surgeon: Marybelle Killings, MD;  Location: [place];  Service: Orthopedics;  Laterality: N/A;  C5-6, C6-7 Anterior Cervical Discectomy and Fusion, allograft, plate  . Tee without cardioversion N/A 02/16/2012    Procedure: TRANSESOPHAGEAL ECHOCARDIOGRAM (TEE);  Surgeon: Jolaine Artist, MD;  Location: [facility] ENDOSCOPY;  Service: Cardiovascular;  Laterality: N/A;  original case scheduled under his dad (who is deceased), rescheduled under correct mrn/pt/dob. [place]/dl  . Tee without cardioversion N/A 03/22/2012    Procedure: TRANSESOPHAGEAL ECHOCARDIOGRAM (TEE);  Surgeon: Lelon Perla, MD;  Location: [facility] ENDOSCOPY;  Service: Cardiovascular;  Laterality: N/A;  . Cardioversion N/A 03/22/2012    Procedure: CARDIOVERSION;  Surgeon: Lelon Perla, MD;  Location: [place];  Service: Cardiovascular;  Laterality: N/A;  . Cardioversion N/A 04/26/2012    Procedure: CARDIOVERSION;  Surgeon: Jolaine Artist, MD;  Location: [place];  Service: Cardiovascular;  Laterality: N/A;  . Central venous catheter tunneled insertion single lumen  09/16/2012    right IJ  . Cardioversion N/A 11/08/2012    Procedure: CARDIOVERSION;  Surgeon: Jolaine Artist, MD;  Location: [facility] ENDOSCOPY;  Service: Cardiovascular;  Laterality: N/A;  . Back surgery  1987, lower    Ruptured disk repair    Current Outpatient  Prescriptions  Medication Sig Dispense Refill  . allopurinol (ZYLOPRIM) 100 MG tablet Take 1 tablet (100 mg total) by mouth daily.  30 tablet  6  . amiodarone (PACERONE) 200 MG tablet Take 300 mg by mouth 2 (two) times daily.       Marland Kitchen apixaban (ELIQUIS) 5 MG TABS tablet Take 1 tablet (5 mg total) by mouth 2 (two) times daily.  60 tablet  4  . carvedilol (COREG) 6.25 MG tablet Take 6.25 mg by mouth 2 (two) times daily with a meal.      . eplerenone (INSPRA) 25 MG tablet Take 25 mg by mouth daily.      Marland Kitchen esomeprazole (NEXIUM) 40 MG capsule Take 40 mg by mouth 2 (two) times daily.      . fluticasone (FLONASE) 50 MCG/ACT nasal spray Place 2 sprays into both nostrils daily as needed for allergies.       . hydrOXYzine (ATARAX/VISTARIL) 25 MG tablet Take 1 tablet (25 mg total) by mouth 2 (two) times daily as needed.  60 tablet  2  . LORazepam (ATIVAN) 0.5 MG tablet Take 1-2 tablets (0.5-1 mg total) by mouth 2 (two) times daily as needed for anxiety.  50 tablet  0  . Magnesium Oxide 400 (240 MG) MG TABS Take 1 tablet by mouth 2 (two) times daily.  60 tablet  3  . metolazone (ZAROXOLYN) 2.5 MG tablet Take 1 tablet (2.5 mg total) by mouth daily as needed (for fluid retention).  10 tablet  6  . milrinone (PRIMACOR) 20 MG/100ML SOLN infusion Inject 0.375 mcg/kg/min into the vein continuous. 440mg  in 434ml infusion 2.5 ml per hour      . Oxycodone HCl 20 MG TABS Take 1 tablet by mouth 3 (three) times daily as needed (pain.).      Marland Kitchen potassium chloride SA (K-DUR,KLOR-CON) 20 MEQ tablet Take 4 tabs in AM and 4 tabs in PM      . promethazine (PHENERGAN) 25 MG tablet Take 25 mg by mouth every 6 (six) hours as needed for nausea or vomiting.      . senna-docusate (SENOKOT-S) 8.6-50 MG per tablet Take 1-2 tablets by mouth 2 (two) times daily as needed for mild constipation or moderate constipation.      . sucralfate (CARAFATE) 1 GM/10ML suspension Take 10 mLs (1 g total) by mouth 4 (four) times daily.  420 mL  1  .  torsemide (DEMADEX) 20 MG tablet Take 20 mg by mouth See admin instructions. Takes three tablet in the morning (60mg ) and two tablet in the evening (40mg )      . zolpidem (AMBIEN) 5 MG tablet Take 1 tablet (5 mg total) by mouth at bedtime as needed for sleep.  30 tablet  0   No current facility-administered medications for this visit.    Allergies as of 10/14/2013 - Review Complete 10/14/2013  Allergen Reaction Noted  . Nitroglycerin Other (See Comments) 11/16/2012    Family History  Problem Relation Age of Onset  . Heart disease    . Heart failure    . Stroke    . Anesthesia problems Neg Hx   .  Hypotension Neg Hx   . Malignant hyperthermia Neg Hx   . Pseudochol deficiency Neg Hx     History   Social History  . Marital Status: Single    Spouse Name: N/A    Number of Children: N/A  . Years of Education: N/A   Occupational History  . Not on file.   Social History Main Topics  . Smoking status: Never Smoker   . Smokeless tobacco: Never Used  . Alcohol Use: No  . Drug Use: Yes    Special: "Crack" cocaine, Cocaine     Comment: last use august 2004  . Sexual Activity: Not Currently   Other Topics Concern  . Not on file   Social History Narrative   3 sons- one in [place], 1 in [place], on in [place]   Retired from post office- on disability   Completed Bachelors from [employer]      Physical Exam: BP 120/90  Pulse 80  Ht 6\' 3"  (1.905 m)  Wt 249 lb 12.8 oz (113.309 kg)  BMI 31.22 kg/m2 Constitutional: generally well-appearing Psychiatric: alert and oriented x3 Abdomen: soft, nontender, nondistended, no obvious ascites, no peritoneal signs, normal bowel sounds     Assessment and plan: 63 y.o. male with severe heart disease, chronic abdominal discomforts  He is on 24-hour inotropic support through intravenous milrinone. Any endoscopic procedures should only be done if an absolute emergency. The symptoms he is having are unlikely to be related to underlying cancer but we did  talk about that possibility. Even if we knew he had a cancer his heart and kidneys are too weak to really undergo any type of treatments. We're going to focus on symptomatic control of his bloating, intermittent GERD. He is going to start taking Gas-X pill with every meal. He believes H2 blockers seem to help him better than proton pump inhibitors and so will start taking Pepcid or Zantac twice daily and as a needed basis. He'll return to see me in 2 months and sooner if needed.

## 2013-10-14 NOTE — Patient Instructions (Addendum)
Try taking gas-es pill with every meal. OK to take zantac or pepcid (buy the generic brand), twice daily and as needed. Please return to see Dr. Ardis Hughs on 12/22/13 9:45 am. Your heart is too weak to undergo any elective invasive tests.

## 2013-10-16 ENCOUNTER — Telehealth: Payer: Self-pay | Admitting: *Deleted

## 2013-10-16 NOTE — Telephone Encounter (Signed)
[facility] calling for pt prescriptions refills for flonase and ativan

## 2013-10-17 ENCOUNTER — Other Ambulatory Visit: Payer: Self-pay

## 2013-10-17 MED ORDER — LORAZEPAM 0.5 MG PO TABS: 0.5000 mg | ORAL_TABLET | Freq: Two times a day (BID) | ORAL | Status: DC | PRN

## 2013-10-17 MED ORDER — FLUTICASONE PROPIONATE 50 MCG/ACT NA SUSP: 2.0000 | Freq: Every day | NASAL | Status: DC | PRN

## 2013-10-17 NOTE — Addendum Note (Signed)
Addended by: Kelle Darting A on: 10/17/2013 09:42 AM   Modules accepted: Orders

## 2013-10-20 ENCOUNTER — Telehealth: Payer: Self-pay | Admitting: Family

## 2013-10-20 NOTE — Telephone Encounter (Signed)
Lorazepam rx faxed to pharmacy at 11:58am.

## 2013-10-20 NOTE — Telephone Encounter (Signed)
[name], flonase and ativan refills sent to pharmacy. Please advise re: Eliquis and senokot refills?

## 2013-10-20 NOTE — Telephone Encounter (Signed)
Eliquis is being rx'd by cardiology- please forward to them. Ok to refill senokot.

## 2013-10-20 NOTE — Telephone Encounter (Signed)
Caller name: Leelyn, Jasinski Relation to pt: self  Call back number: [phone] [facility] [phone]   Reason for call:     LORazepam (ATIVAN) 0.5 MG tablet senna-docusate (SENOKOT-S) 8.6-50 MG per tablet  apixaban (ELIQUIS) 5 MG TABS tablet fluticasone (FLONASE) 50 MCG/ACT nasal spray

## 2013-10-20 NOTE — Telephone Encounter (Signed)
Flonase refill sent 10/17/13 and Ativan Rx printed and forwarded to [facility] for signature.

## 2013-10-21 ENCOUNTER — Ambulatory Visit: Payer: Medicare Other | Admitting: Internal Medicine

## 2013-10-21 MED ORDER — SENNOSIDES-DOCUSATE SODIUM 8.6-50 MG PO TABS: 1.0000 | ORAL_TABLET | Freq: Two times a day (BID) | ORAL | Status: AC | PRN

## 2013-10-21 NOTE — Telephone Encounter (Signed)
Pt is requesting refill of Eliquis.

## 2013-10-23 ENCOUNTER — Telehealth: Payer: Self-pay | Admitting: Family

## 2013-10-23 ENCOUNTER — Telehealth (HOSPITAL_COMMUNITY): Payer: Self-pay | Admitting: Vascular Surgery

## 2013-10-23 NOTE — Telephone Encounter (Signed)
Caller name: Casen Relation to pt: self Call back number: [phone] [facility] [address]  Reason for call:   Patient is requesting a refill of [place]

## 2013-10-23 NOTE — Telephone Encounter (Signed)
[facility]ill Eliquis [facility]ien [facility]

## 2013-10-24 ENCOUNTER — Telehealth (HOSPITAL_COMMUNITY): Payer: Self-pay | Admitting: Vascular Surgery

## 2013-10-24 MED ORDER — ZOLPIDEM TARTRATE 5 MG PO TABS: 5.0000 mg | ORAL_TABLET | Freq: Every evening | ORAL | Status: DC | PRN

## 2013-10-24 NOTE — Telephone Encounter (Signed)
Spoke w/Patty she states pt has not been feeling well, increased SOB, VS stable, abd very distended and larger than a few days ago, she states wt is stable at 244.  She states pt's picc line has also been giving her trouble with blood return and is still sluggish with flushing, advised pt should take metolazone today and appt sch for mon at 10, Chong Sicilian will advise pt

## 2013-10-24 NOTE — Telephone Encounter (Signed)
Rx faxed to pharmacy  

## 2013-10-24 NOTE — Telephone Encounter (Signed)
[facility] from [facility] needs to speak to someone about some some symptoms she is seeing

## 2013-10-24 NOTE — Telephone Encounter (Signed)
Pt has f/u 01/09/14. Last rx printed 08/08/13, #30.  Rx printed and forwarded to [facility] for signature.

## 2013-10-27 ENCOUNTER — Other Ambulatory Visit (HOSPITAL_COMMUNITY): Payer: Self-pay | Admitting: Cardiology

## 2013-10-27 ENCOUNTER — Encounter (HOSPITAL_COMMUNITY): Payer: Self-pay

## 2013-10-27 ENCOUNTER — Encounter: Payer: Self-pay | Admitting: Internal Medicine

## 2013-10-27 ENCOUNTER — Ambulatory Visit (HOSPITAL_COMMUNITY)
Admission: RE | Admit: 2013-10-27 | Discharge: 2013-10-27 | Disposition: A | Discharge status: Home / Self Care | Payer: Medicare Other | Source: Ambulatory Visit | Attending: Internal Medicine | Admitting: Internal Medicine

## 2013-10-27 ENCOUNTER — Ambulatory Visit (HOSPITAL_COMMUNITY)
Admission: RE | Admit: 2013-10-27 | Discharge: 2013-10-27 | Disposition: A | Discharge status: Home / Self Care | Payer: Medicare Other | Source: Ambulatory Visit | Attending: Cardiology | Admitting: Cardiology

## 2013-10-27 VITALS — BP 88/56 | HR 102 | Wt 246.0 lb

## 2013-10-27 DIAGNOSIS — I129 Hypertensive chronic kidney disease with stage 1 through stage 4 chronic kidney disease, or unspecified chronic kidney disease: Secondary | ICD-10-CM | POA: Insufficient documentation

## 2013-10-27 DIAGNOSIS — N184 Chronic kidney disease, stage 4 (severe): Secondary | ICD-10-CM | POA: Insufficient documentation

## 2013-10-27 DIAGNOSIS — Y831 Surgical operation with implant of artificial internal device as the cause of abnormal reaction of the patient, or of later complication, without mention of misadventure at the time of the procedure: Secondary | ICD-10-CM | POA: Insufficient documentation

## 2013-10-27 DIAGNOSIS — I252 Old myocardial infarction: Secondary | ICD-10-CM | POA: Diagnosis not present

## 2013-10-27 DIAGNOSIS — I4891 Unspecified atrial fibrillation: Secondary | ICD-10-CM | POA: Insufficient documentation

## 2013-10-27 DIAGNOSIS — I5022 Chronic systolic (congestive) heart failure: Secondary | ICD-10-CM

## 2013-10-27 DIAGNOSIS — I472 Ventricular tachycardia: Secondary | ICD-10-CM | POA: Insufficient documentation

## 2013-10-27 DIAGNOSIS — K219 Gastro-esophageal reflux disease without esophagitis: Secondary | ICD-10-CM | POA: Insufficient documentation

## 2013-10-27 DIAGNOSIS — E780 Pure hypercholesterolemia: Secondary | ICD-10-CM | POA: Insufficient documentation

## 2013-10-27 DIAGNOSIS — Z7901 Long term (current) use of anticoagulants: Secondary | ICD-10-CM | POA: Diagnosis not present

## 2013-10-27 DIAGNOSIS — I429 Cardiomyopathy, unspecified: Secondary | ICD-10-CM | POA: Insufficient documentation

## 2013-10-27 DIAGNOSIS — R000 Tachycardia, unspecified: Secondary | ICD-10-CM | POA: Insufficient documentation

## 2013-10-27 DIAGNOSIS — I2510 Atherosclerotic heart disease of native coronary artery without angina pectoris: Secondary | ICD-10-CM | POA: Diagnosis not present

## 2013-10-27 DIAGNOSIS — T82594A Other mechanical complication of infusion catheter, initial encounter: Secondary | ICD-10-CM | POA: Diagnosis not present

## 2013-10-27 DIAGNOSIS — Z950 Presence of cardiac pacemaker: Secondary | ICD-10-CM | POA: Diagnosis not present

## 2013-10-27 DIAGNOSIS — Z95810 Presence of automatic (implantable) cardiac defibrillator: Secondary | ICD-10-CM | POA: Insufficient documentation

## 2013-10-27 DIAGNOSIS — D869 Sarcoidosis, unspecified: Secondary | ICD-10-CM | POA: Diagnosis not present

## 2013-10-27 DIAGNOSIS — I4892 Unspecified atrial flutter: Secondary | ICD-10-CM

## 2013-10-27 LAB — COMPREHENSIVE METABOLIC PANEL
ALT: 26 U/L (ref 0–53)
ANION GAP: 20 — AB (ref 5–15)
AST: 35 U/L (ref 0–37)
Albumin: 3.6 g/dL (ref 3.5–5.2)
Alkaline Phosphatase: 97 U/L (ref 39–117)
BILIRUBIN TOTAL: 0.5 mg/dL (ref 0.3–1.2)
BUN: 62 mg/dL — AB (ref 6–23)
CALCIUM: 10.4 mg/dL (ref 8.4–10.5)
CHLORIDE: 95 meq/L — AB (ref 96–112)
CO2: 24 meq/L (ref 19–32)
Creatinine, Ser: 2.67 mg/dL — ABNORMAL HIGH (ref 0.50–1.35)
GFR, EST AFRICAN AMERICAN: 28 mL/min — AB (ref >90)
GFR, EST NON AFRICAN AMERICAN: 24 mL/min — AB (ref >90)
Glucose, Bld: 138 mg/dL — ABNORMAL HIGH (ref 70–99)
Potassium: 3.4 mEq/L — ABNORMAL LOW (ref 3.7–5.3)
Sodium: 139 mEq/L (ref 137–147)
Total Protein: 8.5 g/dL — ABNORMAL HIGH (ref 6.0–8.3)

## 2013-10-27 LAB — CARBOXYHEMOGLOBIN
Carboxyhemoglobin: 1 % (ref 0.5–1.5)
METHEMOGLOBIN: 1 % (ref 0.0–1.5)
O2 Saturation: 57.5 %
Total hemoglobin: 15.4 g/dL (ref 13.5–18.0)

## 2013-10-27 LAB — TSH: TSH: 3.97 u[IU]/mL (ref 0.350–4.500)

## 2013-10-27 MED ORDER — APIXABAN 5 MG PO TABS: 5.0000 mg | ORAL_TABLET | Freq: Two times a day (BID) | ORAL | Status: DC

## 2013-10-27 NOTE — Patient Instructions (Signed)
Labs today  Your physician recommends that you schedule a follow-up appointment in: 2 weeks  

## 2013-10-27 NOTE — Addendum Note (Signed)
Encounter addended by: Kerry Dory, CMA on: 10/27/2013 11:31 AM<BR>     Documentation filed: Orders

## 2013-10-27 NOTE — Progress Notes (Signed)
Patient ID: [name], male   DOB: March 23, 1950, 63 y.o.   MRN: [id number]  HPI: [name] is a 63 y.o. gentlemen with severe HF due to NICM (EF 20-25%) with multiple hospital admissions for HF exacerbations. He also has history of ventricular tachycardia s/p St Jude ICD by [name], CKD: stage IV, and paroxysmal atrial arrhythmias on amiodarone as well as sarcoidosis and hypertension. Cath in 2008 by [name] showed no CAD.Seen at [facility] and not felt to be a transplant candidate due to renal failure and lack of family support.  Milrinone initiated in January 2014 for low output. 03/01/12 milrinone increased 0.329mc/kg/min.   ECHO 03/22/12 EF 15%  Echo 6/15: 20-25%  04/26/12 S/P Successful DC-CV for atrial fibrillation. Remains on amiodarone.   RHC 04/30/13 RA = 4  RV = 20/1/3  PA = 23/8 (15)  PCW = 5  Fick cardiac output/index =4.5/1.9  Thermo CO/CI = 3.9/1.6  PVR =  FA sat = 98%  PA sat = 62%, 64% - On milrinone 0.25  Follow-up: Remains on milrinone 0.375. Weight is down 4 lbs.  Feels "bad," no energy.  Feels like he is in atrial fibrillation with his heart pounding.  Today's ECG shows mild sinus tachycardia with PVCs.  He is tired and short of breath after walking 100 feet.  He has been out of Eliquis x 1 week.  No chest pain.  Tunneled catheter is not working well with high pressure alarms.  Creatinine relatively stable at 2.13 when last checked.   He is not on an ace inhibitor due to elevated creatinine. Not on nitrates due to Viagra use.   Labs:  08/15/12 Creatinine 4.24 Potassium 3.3 09/19/12 Creatinine 3.98, Potassium 3.1, BUN 78 10/07/12 Creatinine 2.62, Potassium 3.4 10/18/12 Creatinine 3.3, K+3.1 11/08/12 Creatinine 2.15 K 4.0 11/25/12 Creatinine 2.93, K+ 3.5 03/10/13: K+ 2.9, Creatinine 2.63 04/30/13: K+ 3.7, creatinine 2.4 6/15: K 3, creatinine 3.6 09/11/13: K 3.1 Cr 1.87  10/15: K 3.8, creatinine 2.13, HCT 40.7  ROS: All systems negative except as listed in HPI, PMH  and Problem List.  Past Medical History  Diagnosis Date  . CHF (congestive heart failure)   . Sarcoidosis   . Cardiomyopathy, dilated, nonischemic     non ischemic by cath  . Acute on chronic systolic heart failure   . Automatic implantable cardiac defibrillator in situ   . Atrial fibrillation   . NSVT (nonsustained ventricular tachycardia)   . GERD (gastroesophageal reflux disease)   . Hypercholesteremia   . Shortness of breath   . Chronic kidney disease (CKD), stage III (moderate)   . Pacemaker   . Anginal pain   . Gout   . Hypertension     [name]  . Coronary artery disease   . Elevated PSA 06/24/2013  . Myocardial infarction    Current Outpatient Prescriptions  Medication Sig Dispense Refill  . allopurinol (ZYLOPRIM) 100 MG tablet Take 1 tablet (100 mg total) by mouth daily.  30 tablet  6  . amiodarone (PACERONE) 200 MG tablet Take 200 mg by mouth 2 (two) times daily.       Marland Kitchen apixaban (ELIQUIS) 5 MG TABS tablet Take 1 tablet (5 mg total) by mouth 2 (two) times daily.  60 tablet  4  . carvedilol (COREG) 6.25 MG tablet Take 3.125 mg by mouth 2 (two) times daily with a meal.       . eplerenone (INSPRA) 25 MG tablet Take 25 mg by mouth daily.      Marland Kitchen  esomeprazole (NEXIUM) 40 MG capsule Take 40 mg by mouth 2 (two) times daily.      . fluticasone (FLONASE) 50 MCG/ACT nasal spray Place 2 sprays into both nostrils daily as needed for allergies.  16 g  2  . hydrOXYzine (ATARAX/VISTARIL) 25 MG tablet Take 1 tablet (25 mg total) by mouth 2 (two) times daily as needed.  60 tablet  2  . LORazepam (ATIVAN) 0.5 MG tablet Take 1-2 tablets (0.5-1 mg total) by mouth 2 (two) times daily as needed for anxiety.  50 tablet  0  . Magnesium Oxide 400 (240 MG) MG TABS Take 1 tablet by mouth 2 (two) times daily.  60 tablet  3  . metolazone (ZAROXOLYN) 2.5 MG tablet Take 1 tablet (2.5 mg total) by mouth daily as needed (for fluid retention).  10 tablet  6  . milrinone (PRIMACOR) 20 MG/100ML SOLN  infusion Inject 0.375 mcg/kg/min into the vein continuous. 440mg  in 469ml infusion 2.5 ml per hour      . Oxycodone HCl 20 MG TABS Take 1 tablet by mouth 3 (three) times daily as needed (pain.).      Marland Kitchen potassium chloride SA (K-DUR,KLOR-CON) 20 MEQ tablet Take 4 tabs in AM and 4 tabs in PM      . promethazine (PHENERGAN) 25 MG tablet Take 25 mg by mouth every 6 (six) hours as needed for nausea or vomiting.      . senna-docusate (SENOKOT-S) 8.6-50 MG per tablet Take 1-2 tablets by mouth 2 (two) times daily as needed for mild constipation or moderate constipation.  60 tablet  3  . sucralfate (CARAFATE) 1 GM/10ML suspension Take 10 mLs (1 g total) by mouth 4 (four) times daily.  420 mL  1  . torsemide (DEMADEX) 20 MG tablet Take 20 mg by mouth See admin instructions. Takes three tablet in the morning (60mg ) and two tablet in the evening (40mg )      . zolpidem (AMBIEN) 5 MG tablet Take 1 tablet (5 mg total) by mouth at bedtime as needed for sleep.  30 tablet  0   No current facility-administered medications for this encounter.   Filed Vitals:   10/27/13 0918  BP: 88/56  Pulse: 49  Weight: 246 lb (111.585 kg)  SpO2: 96%    PHYSICAL EXAM: General: NAD; well appearing HEENT: normal  Neck: supple. JVP 7 cm. Carotids 2+ bilat; no bruits. No lymphadenopathy or thyromegaly appreciated.  Cor: PMI laterally displaced. Regular S1 S2 with no S3 and 2/6 HSM apex. R upper chest hickman site with milrinone infusing.  Lungs: clear Abdomen: soft, nontender, obese. No bruits or masses. Good bowel sounds.  Mild abdominal distention.  Extremities: no cyanosis, clubbing, rash, no lower extremity edema  Neuro: alert & orientedx3, cranial nerves grossly intact. moves all 4 extremities w/o difficulty. Affect pleasant   ECG: sinus tachy at 102 with PVCs  ASSESSMENT & PLAN:   1) Chronic systolic HF: NICM, EF 17-00% (06/2013). St Jude ICD. He has been on home milrinone 0.375 mcg/kg/min over 1 year.  He is more  fatigued and short of breath with exertion though he does not look particularly volume overloaded.  I wonder if he is not getting steady milrinone infusion given pump alarms and difficulty drawing/flushing with his tunneled catheter.  - I am going to have his catheter replaced today.  Continue current milrinone but will check co-ox.   - Continue low dose coreg 3.125 mg BID - Continue Inspra 12.5 mg daily  -  Continue torsemide 60/40 with metolazone once a week.  - No ACEI with CKD - BMET today.  2) Atrial arrhythmias: Paroxysmal. Now in NSR (sinus tachycardia today) on amiodarone.  He is out of Eliquis.  - Will leave amio at 200 bid for now given how many recurrences he has had in past. Check LFTs and TSH today given amiodarone use.  - Restart Eliquis.  3) CKD, stage IV: Recheck creatinine today.   4) Chronic pain: Per pain clinic.   F/u 2 wks to see if he feels better.   Loralie Champagne 10/27/2013

## 2013-10-28 ENCOUNTER — Ambulatory Visit (HOSPITAL_COMMUNITY)
Admission: RE | Admit: 2013-10-28 | Discharge: 2013-10-28 | Disposition: A | Discharge status: Home / Self Care | Payer: Medicare Other | Source: Ambulatory Visit | Attending: Cardiology | Admitting: Cardiology

## 2013-10-28 DIAGNOSIS — I5022 Chronic systolic (congestive) heart failure: Secondary | ICD-10-CM

## 2013-10-28 DIAGNOSIS — T82594A Other mechanical complication of infusion catheter, initial encounter: Secondary | ICD-10-CM | POA: Insufficient documentation

## 2013-10-28 DIAGNOSIS — Y831 Surgical operation with implant of artificial internal device as the cause of abnormal reaction of the patient, or of later complication, without mention of misadventure at the time of the procedure: Secondary | ICD-10-CM | POA: Insufficient documentation

## 2013-10-28 MED ORDER — LIDOCAINE HCL 1 % IJ SOLN
INTRAMUSCULAR | Status: AC
  Filled 2013-10-28: qty 20

## 2013-10-28 MED ORDER — CHLORHEXIDINE GLUCONATE 4 % EX LIQD
CUTANEOUS | Status: AC
  Filled 2013-10-28: qty 15

## 2013-10-28 NOTE — Procedures (Signed)
PICC flushed scheduked for replacement PICC 10/27

## 2013-10-28 NOTE — Procedures (Signed)
Procedure:  Right tunneled central catheter exchange Findings:  New 6 Fr dual lumen tunneled power line placed over wire after removal of occluded cath. New cath 23 cm from tip to cuff.  Tip at cavoatrial junction.  OK to use.

## 2013-10-28 NOTE — Addendum Note (Signed)
Encounter addended by: Vanessa Barbara, CCT on: 10/28/2013  8:42 AM<BR>     Documentation filed: Charges VN

## 2013-10-29 NOTE — Telephone Encounter (Signed)
Addressed during pts office visit 10/27/13

## 2013-11-02 IMAGING — US US ABDOMEN LIMITED
1 series · 5 of 5 positions shown · non-contrast
Comparison: none

CLINICAL DATA: Patient with history of CHF, chronic kidney
disease, reported history of ascites.  Request is made for
paracentesis.

LIMITED ABDOMINAL ULTRASOUND
Date of procedure: 05/21/2012

[Series 1: us abdomen limited · 0.30mm/px · 5 of 5 slices shown]
[im 1/5]
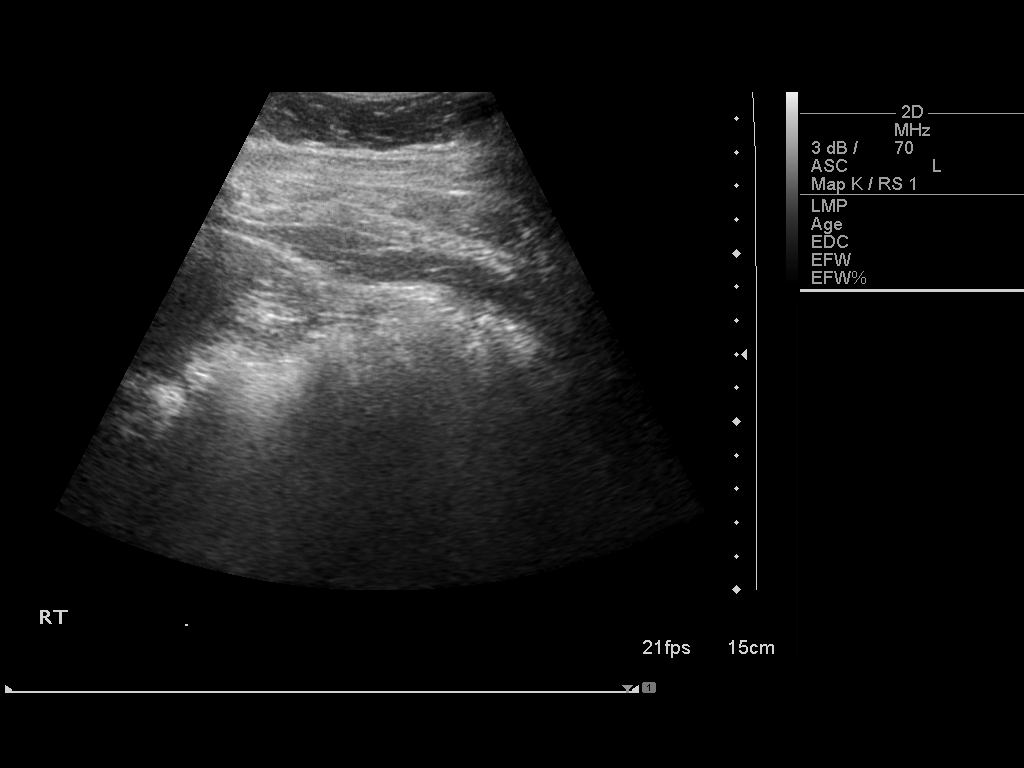
[im 2/5]
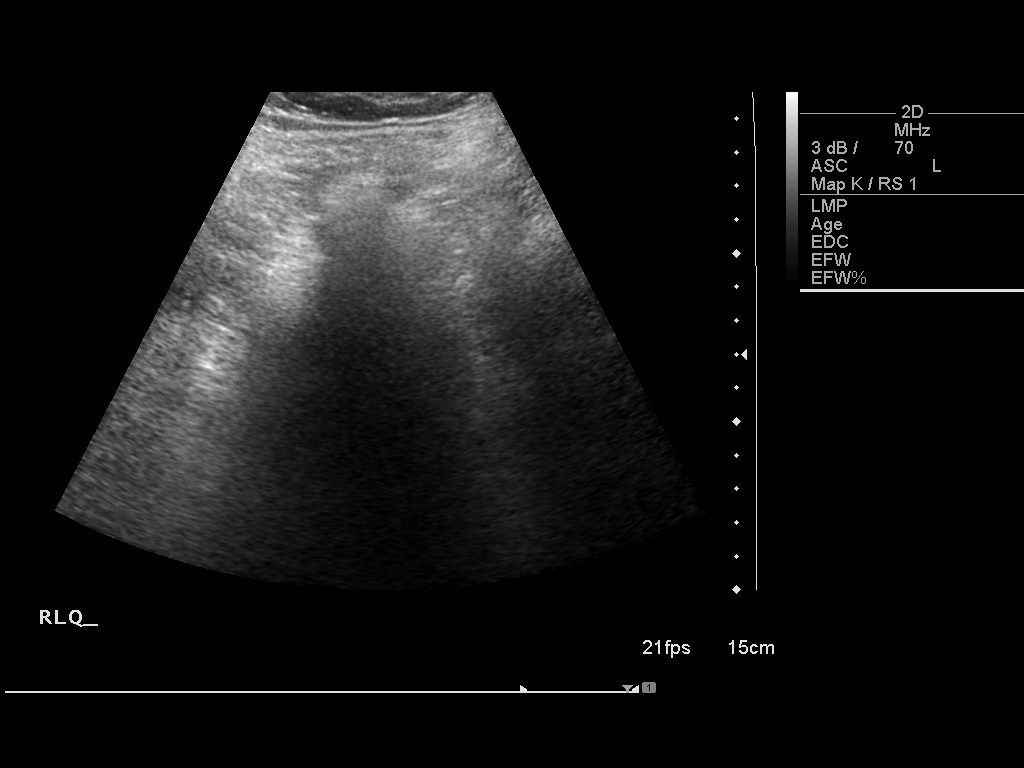
[im 3/5]
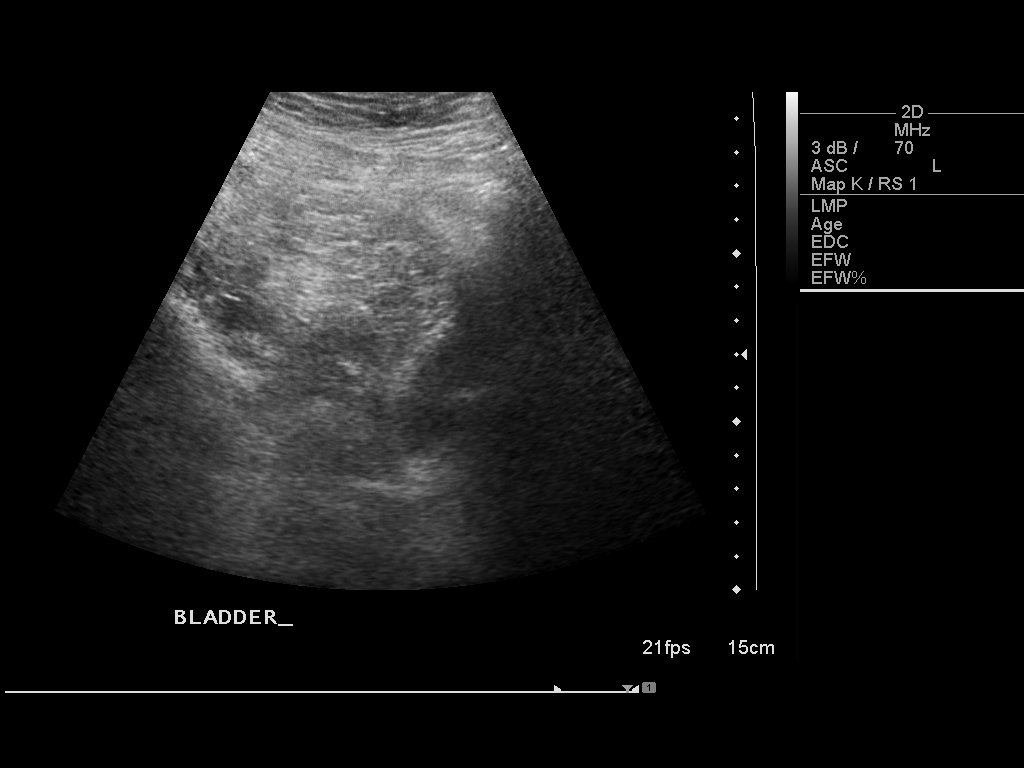
[im 4/5]
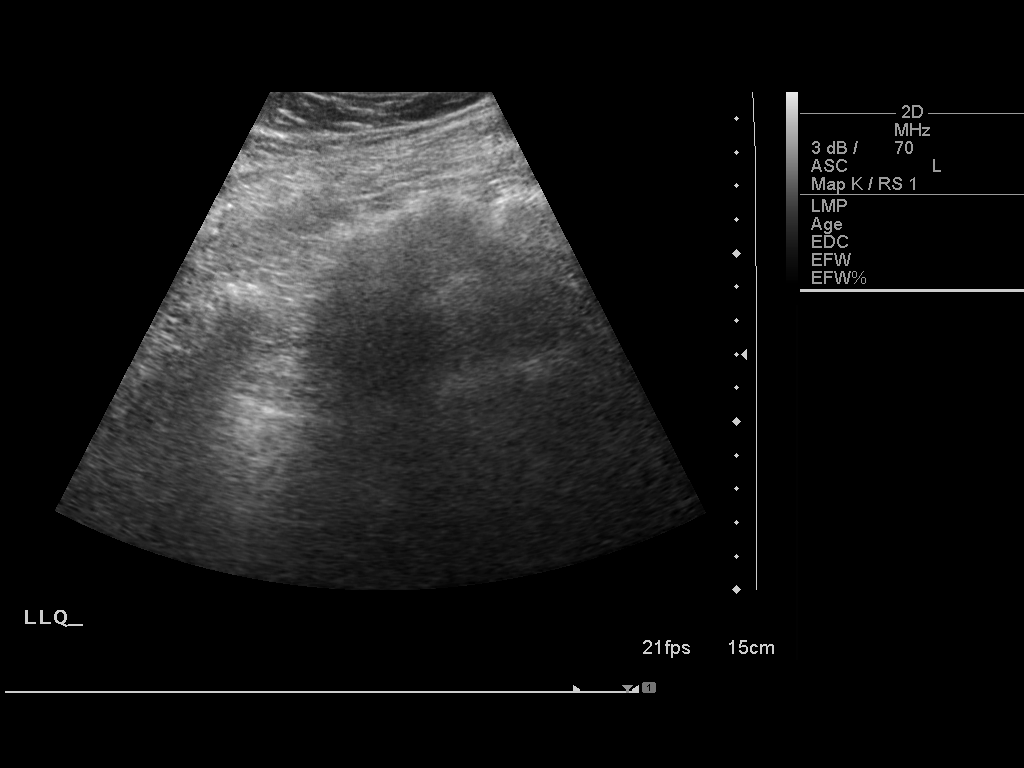
[im 5/5]
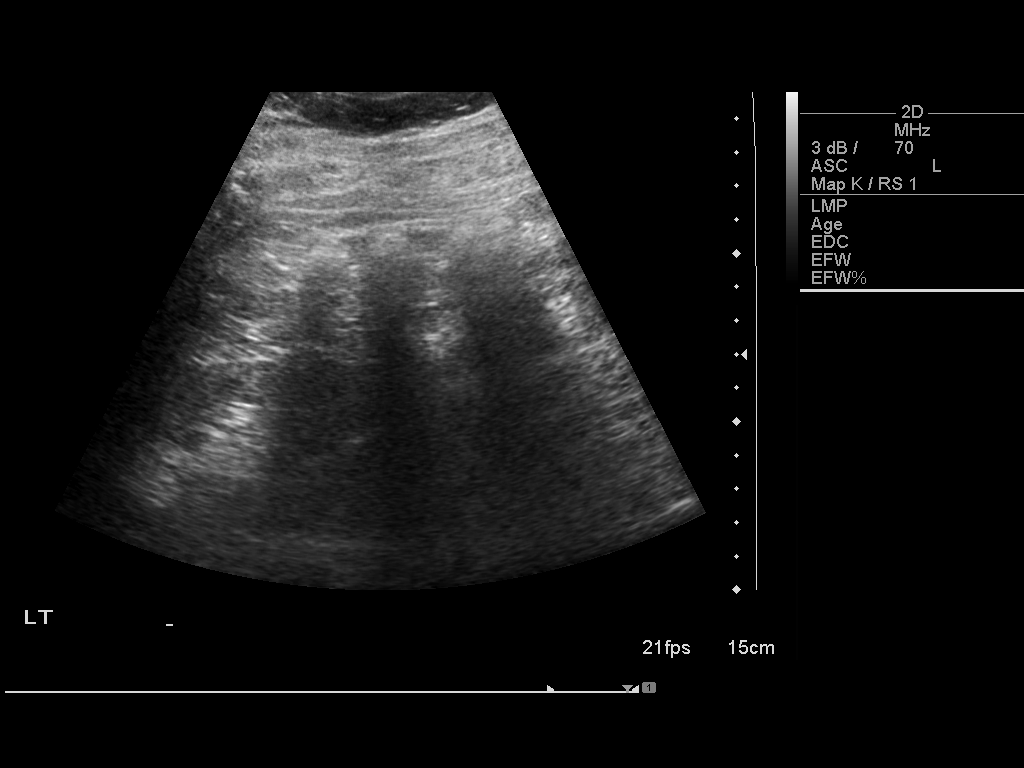

[5 of 5 positions shown; findings below may reference images not displayed]

FINDINGS: Limited ultrasound of all four abdominal quadrants
reveals no appreciable ascites.  Paracentesis was not performed.
The patient was informed of findings.
IMPRESSION: No significant ascites noted on limited ultrasound of
abdomen.  Paracentesis was not performed.

## 2013-11-07 ENCOUNTER — Encounter (HOSPITAL_COMMUNITY): Payer: Self-pay

## 2013-11-07 ENCOUNTER — Ambulatory Visit (HOSPITAL_COMMUNITY)
Admission: RE | Admit: 2013-11-07 | Discharge: 2013-11-07 | Disposition: A | Discharge status: Home / Self Care | Payer: Medicare Other | Source: Ambulatory Visit | Attending: Cardiology | Admitting: Cardiology

## 2013-11-07 VITALS — BP 110/84 | HR 88 | Wt 252.0 lb

## 2013-11-07 DIAGNOSIS — I498 Other specified cardiac arrhythmias: Secondary | ICD-10-CM

## 2013-11-07 DIAGNOSIS — N182 Chronic kidney disease, stage 2 (mild): Secondary | ICD-10-CM

## 2013-11-07 DIAGNOSIS — G8929 Other chronic pain: Secondary | ICD-10-CM | POA: Diagnosis not present

## 2013-11-07 DIAGNOSIS — I5022 Chronic systolic (congestive) heart failure: Secondary | ICD-10-CM | POA: Diagnosis not present

## 2013-11-07 DIAGNOSIS — G894 Chronic pain syndrome: Secondary | ICD-10-CM

## 2013-11-07 DIAGNOSIS — I472 Ventricular tachycardia: Secondary | ICD-10-CM | POA: Diagnosis not present

## 2013-11-07 DIAGNOSIS — I499 Cardiac arrhythmia, unspecified: Secondary | ICD-10-CM

## 2013-11-07 DIAGNOSIS — N184 Chronic kidney disease, stage 4 (severe): Secondary | ICD-10-CM | POA: Insufficient documentation

## 2013-11-07 NOTE — Patient Instructions (Signed)
Follow up in 4 eeks.   Do the following things EVERYDAY: 1) Weigh yourself in the morning before breakfast. Write it down and keep it in a log. 2) Take your medicines as prescribed 3) Eat low salt foods-Limit salt (sodium) to 2000 mg per day.  4) Stay as active as you can everyday 5) Limit all fluids for the day to less than 2 liters

## 2013-11-07 NOTE — Progress Notes (Signed)
Patient ID: [name], male   DOB: [birth year], 63 y.o.   MRN: [id number] PCP: [name]   HPI: [name] is a 63 y.o. gentlemen with severe HF due to NICM (EF 20-25%) with multiple hospital admissions for HF exacerbations. He also has history of ventricular tachycardia s/p St Jude ICD by [name], CKD: stage IV, and paroxysmal atrial arrhythmias on amiodarone as well as sarcoidosis and hypertension. Cath in 2008 by [name] showed no CAD.Seen at [facility] and not felt to be a transplant candidate due to renal failure and lack of family support.  Milrinone initiated in January 2014 for low output. 03/01/12 milrinone increased 0.322mc/kg/min.   ECHO 03/22/12 EF 15%  Echo 6/15: 20-25%  04/26/12 S/P Successful DC-CV for atrial fibrillation. Remains on amiodarone.   RHC 04/30/13 RA = 4  RV = 20/1/3  PA = 23/8 (15)  PCW = 5  Fick cardiac output/index =4.5/1.9  Thermo CO/CI = 3.9/1.6  PVR =  FA sat = 98%  PA sat = 62%, 64% - On milrinone 0.25  Follow-up: Had tunneling catheter replaced 10/27 and he says he is feeling much better. Mild dyspnea ambulating. More energy. Denies PND/Orthopnea.  Sleeping on 2 pillows. Weight at home 243-244 pounds. Taking all medications. [facility] following for home milrinone.   He is not on an ace inhibitor due to elevated creatinine. Not on nitrates due to Viagra use.   Labs:  08/15/12 Creatinine 4.24 Potassium 3.3 09/19/12 Creatinine 3.98, Potassium 3.1, BUN 78 10/07/12 Creatinine 2.62, Potassium 3.4 10/18/12 Creatinine 3.3, K+3.1 11/08/12 Creatinine 2.15 K 4.0 11/25/12 Creatinine 2.93, K+ 3.5 03/10/13: K+ 2.9, Creatinine 2.63 04/30/13: K+ 3.7, creatinine 2.4 6/15: K 3, creatinine 3.6 09/11/13: K 3.1 Cr 1.87  10/15: K 3.8, creatinine 2.13, HCT 40.7 11/06/13 K 4.0 Creatinine 1.58   ROS: All systems negative except as listed in HPI, PMH and Problem List.  Past Medical History  Diagnosis Date  . CHF (congestive heart failure)   . Sarcoidosis   .  Cardiomyopathy, dilated, nonischemic     non ischemic by cath  . Acute on chronic systolic heart failure   . Automatic implantable cardiac defibrillator in situ   . Atrial fibrillation   . NSVT (nonsustained ventricular tachycardia)   . GERD (gastroesophageal reflux disease)   . Hypercholesteremia   . Shortness of breath   . Chronic kidney disease (CKD), stage III (moderate)   . Pacemaker   . Anginal pain   . Gout   . Hypertension     [name]  . Coronary artery disease   . Elevated PSA 06/24/2013  . Myocardial infarction    Current Outpatient Prescriptions  Medication Sig Dispense Refill  . allopurinol (ZYLOPRIM) 100 MG tablet Take 1 tablet (100 mg total) by mouth daily. 30 tablet 6  . amiodarone (PACERONE) 200 MG tablet Take 200 mg by mouth 2 (two) times daily.     Marland Kitchen apixaban (ELIQUIS) 5 MG TABS tablet Take 1 tablet (5 mg total) by mouth 2 (two) times daily. 60 tablet 4  . carvedilol (COREG) 6.25 MG tablet Take 6.25 mg by mouth 2 (two) times daily with a meal.     . eplerenone (INSPRA) 25 MG tablet Take 25 mg by mouth daily.    Marland Kitchen esomeprazole (NEXIUM) 40 MG capsule Take 40 mg by mouth 2 (two) times daily.    . fluticasone (FLONASE) 50 MCG/ACT nasal spray Place 2 sprays into both nostrils daily as needed for allergies. [place]  g 2  . hydrOXYzine (ATARAX/VISTARIL) 25 MG tablet Take 1 tablet (25 mg total) by mouth 2 (two) times daily as needed. 60 tablet 2  . LORazepam (ATIVAN) 0.5 MG tablet Take 1-2 tablets (0.5-1 mg total) by mouth 2 (two) times daily as needed for anxiety. 50 tablet 0  . Magnesium Oxide 400 (240 MG) MG TABS Take 1 tablet by mouth 2 (two) times daily. 60 tablet 3  . metolazone (ZAROXOLYN) 2.5 MG tablet Take 1 tablet (2.5 mg total) by mouth daily as needed (for fluid retention). 10 tablet 6  . milrinone (PRIMACOR) 20 MG/100ML SOLN infusion Inject 0.375 mcg/kg/min into the vein continuous. 440mg  in 461ml infusion 2.5 ml per hour    . Oxycodone HCl 20 MG TABS Take 1  tablet by mouth 3 (three) times daily as needed (pain.).    Marland Kitchen potassium chloride SA (K-DUR,KLOR-CON) 20 MEQ tablet Take 4 tabs in AM and 4 tabs in PM    . promethazine (PHENERGAN) 25 MG tablet Take 25 mg by mouth every 6 (six) hours as needed for nausea or vomiting.    . senna-docusate (SENOKOT-S) 8.6-50 MG per tablet Take 1-2 tablets by mouth 2 (two) times daily as needed for mild constipation or moderate constipation. 60 tablet 3  . sucralfate (CARAFATE) 1 GM/10ML suspension Take 10 mLs (1 g total) by mouth 4 (four) times daily. 420 mL 1  . torsemide (DEMADEX) 20 MG tablet Take 20 mg by mouth See admin instructions. Takes three tablet in the morning (60mg ) and two tablet in the evening (40mg )    . zolpidem (AMBIEN) 5 MG tablet Take 1 tablet (5 mg total) by mouth at bedtime as needed for sleep. 30 tablet 0   No current facility-administered medications for this encounter.   Filed Vitals:   11/07/13 0955  BP: 110/84  Pulse: 88  Weight: 252 lb (114.306 kg)  SpO2: 93%    PHYSICAL EXAM: General: NAD; well appearing HEENT: normal  Neck: supple. JVP 7 cm. Carotids 2+ bilat; no bruits. No lymphadenopathy or thyromegaly appreciated.  Cor: PMI laterally displaced. Regular S1 S2 with no S3 and 2/6 HSM apex. R upper chest hickman site with milrinone infusing.  Lungs: clear Abdomen: soft, nontender, obese. No bruits or masses. Good bowel sounds.  Mild abdominal distention.  Extremities: no cyanosis, clubbing, rash, no lower extremity edema  Neuro: alert & orientedx3, cranial nerves grossly intact. moves all 4 extremities w/o difficulty. Affect pleasant   EKG: SR   ASSESSMENT & PLAN:   1) Chronic systolic HF: NICM, EF 62-83% (06/2013). St Jude ICD. He has been on home milrinone 0.375 mcg/kg/min over 1 year.  Doing much better.  - Continue current milrinone dose.  - Continue low dose coreg 3.125 mg BID - Continue Inspra 12.5 mg daily  - Continue torsemide 60/40 with metolazone once a week.  -  No ACEI with CKD - Continue [facility] with weekly BMET  2) Atrial arrhythmias: Paroxysmal. Now in NSR on amiodarone. Back on Eliquis.   3) CKD, stage IV: Recheck creatinine today.   4) Chronic pain: Per pain clinic.   Follow up in 4 weeks. Continue [facility] for home milrinone.    CLEGG,AMY NP-C  11/07/2013

## 2013-11-09 NOTE — Addendum Note (Signed)
Encounter addended by: Asencion Gowda, CCT on: 11/09/2013 11:20 AM<BR>     Documentation filed: Charges VN

## 2013-11-11 ENCOUNTER — Telehealth (HOSPITAL_COMMUNITY): Payer: Self-pay | Admitting: Adult Health

## 2013-11-11 NOTE — Telephone Encounter (Signed)
Recived call from Mr Robb.   He reports ICD shock x 2.   I have asked him to report to ED. He is currently in [place].   He confirmed he will present to [facility] ED for an evaluations.   CLEGG,AMY NP-C  1:44 PM

## 2013-11-13 ENCOUNTER — Other Ambulatory Visit: Payer: Self-pay | Admitting: Family

## 2013-11-13 IMAGING — CR DG ELBOW COMPLETE 3+V*R*
4 series · 4 of 4 positions shown · non-contrast
Comparison: None.

CLINICAL DATA: Fall, right elbow pain

RIGHT ELBOW - COMPLETE 3+ VIEW

[x elbow joint ap right]
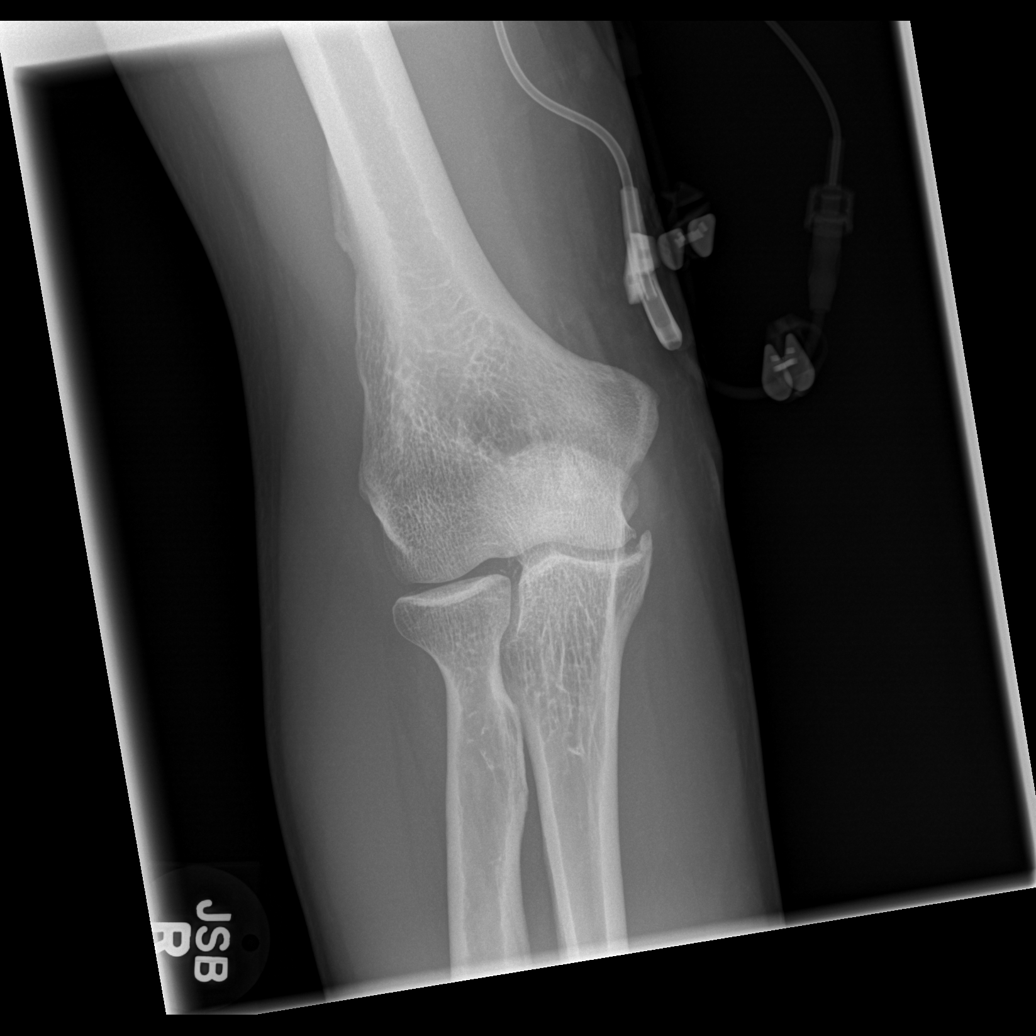

[x elbow joint obl. right (1 of 2)]
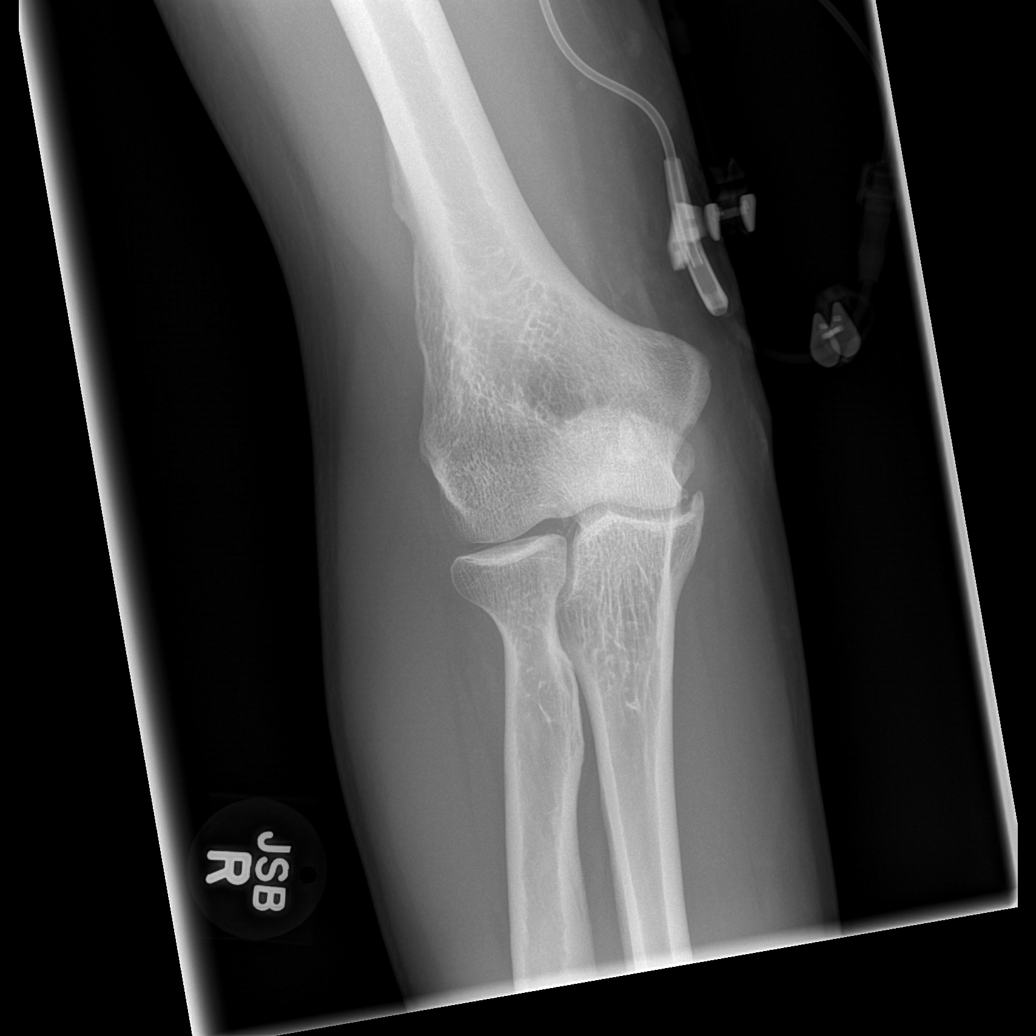

[x elbow joint obl. right (2 of 2)]
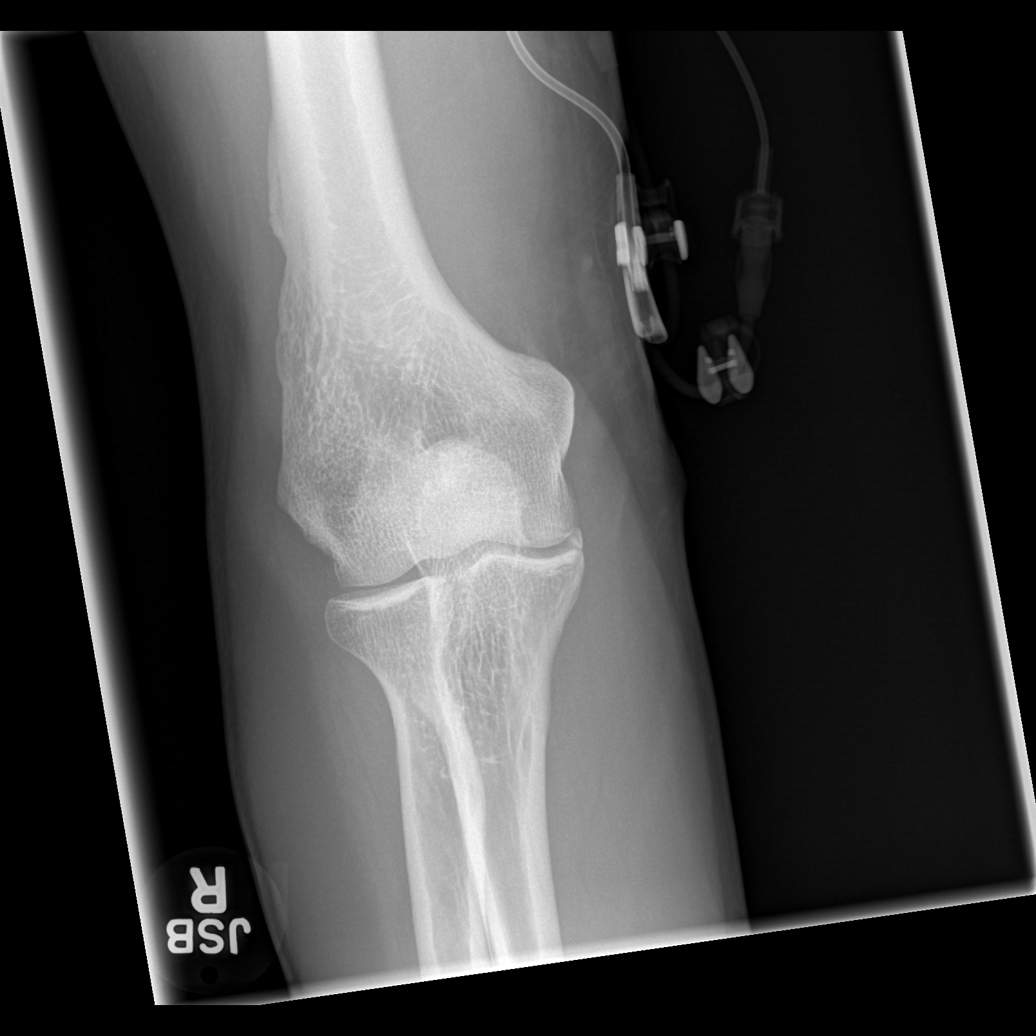

[x elbow joint lat right]
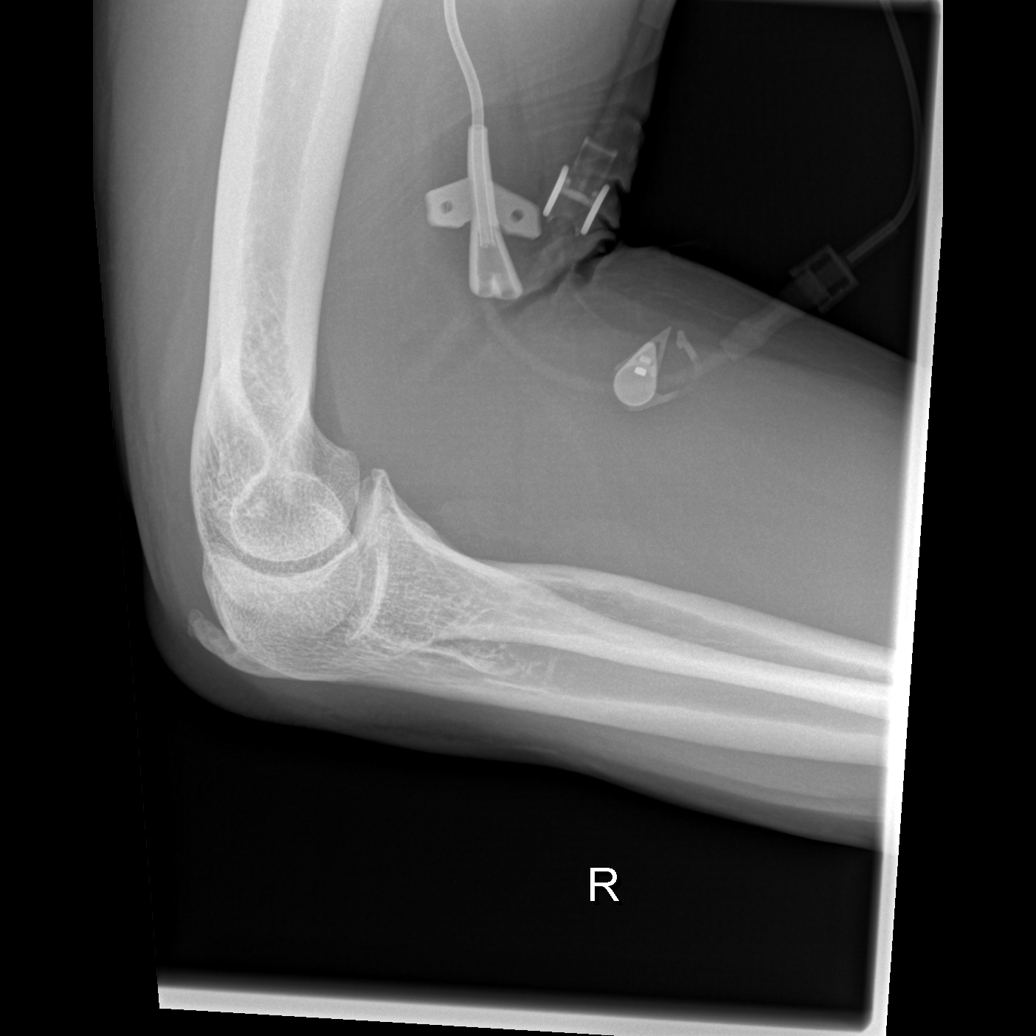

[4 of 4 positions shown; findings below may reference images not displayed]

FINDINGS: No fracture or dislocation is seen.

Mild degenerative changes.

No displaced elbow joint fat pads to suggest an elbow joint
effusion.
IMPRESSION: No fracture or dislocation is seen.

Mild degenerative changes.

## 2013-11-25 ENCOUNTER — Ambulatory Visit: Payer: Medicare Other | Admitting: Gastroenterology

## 2013-11-26 ENCOUNTER — Telehealth (HOSPITAL_COMMUNITY): Payer: Self-pay | Admitting: Vascular Surgery

## 2013-11-26 MED ORDER — ZOLPIDEM TARTRATE 5 MG PO TABS: 5.0000 mg | ORAL_TABLET | Freq: Every evening | ORAL | Status: DC | PRN

## 2013-11-26 NOTE — Telephone Encounter (Signed)
Ok per Dr Haroldine Laws to refill now but further refills need to come from pcp, pt is aware and agreeable

## 2013-11-26 NOTE — Telephone Encounter (Signed)
PT called for refill Ambien.Marland Kitchen He called the pharmacy and they told him to contact the office ... Please advise

## 2013-11-27 ENCOUNTER — Encounter (HOSPITAL_COMMUNITY): Payer: Self-pay | Admitting: *Deleted

## 2013-11-27 ENCOUNTER — Emergency Department (HOSPITAL_COMMUNITY): Payer: Medicare Other

## 2013-11-27 ENCOUNTER — Emergency Department (HOSPITAL_COMMUNITY)
Admission: EM | Admit: 2013-11-27 | Discharge: 2013-11-28 | Disposition: A | Discharge status: Home / Self Care | Payer: Medicare Other | Attending: Emergency Medicine | Admitting: Emergency Medicine

## 2013-11-27 DIAGNOSIS — I252 Old myocardial infarction: Secondary | ICD-10-CM | POA: Diagnosis not present

## 2013-11-27 DIAGNOSIS — I5023 Acute on chronic systolic (congestive) heart failure: Secondary | ICD-10-CM | POA: Diagnosis not present

## 2013-11-27 DIAGNOSIS — M25561 Pain in right knee: Secondary | ICD-10-CM | POA: Insufficient documentation

## 2013-11-27 DIAGNOSIS — Z95810 Presence of automatic (implantable) cardiac defibrillator: Secondary | ICD-10-CM | POA: Insufficient documentation

## 2013-11-27 DIAGNOSIS — I4891 Unspecified atrial fibrillation: Secondary | ICD-10-CM | POA: Insufficient documentation

## 2013-11-27 DIAGNOSIS — K219 Gastro-esophageal reflux disease without esophagitis: Secondary | ICD-10-CM | POA: Insufficient documentation

## 2013-11-27 DIAGNOSIS — Z79899 Other long term (current) drug therapy: Secondary | ICD-10-CM | POA: Diagnosis not present

## 2013-11-27 DIAGNOSIS — M1990 Unspecified osteoarthritis, unspecified site: Secondary | ICD-10-CM

## 2013-11-27 DIAGNOSIS — I129 Hypertensive chronic kidney disease with stage 1 through stage 4 chronic kidney disease, or unspecified chronic kidney disease: Secondary | ICD-10-CM | POA: Diagnosis not present

## 2013-11-27 DIAGNOSIS — N183 Chronic kidney disease, stage 3 (moderate): Secondary | ICD-10-CM | POA: Diagnosis not present

## 2013-11-27 DIAGNOSIS — I2510 Atherosclerotic heart disease of native coronary artery without angina pectoris: Secondary | ICD-10-CM | POA: Diagnosis not present

## 2013-11-27 DIAGNOSIS — M109 Gout, unspecified: Secondary | ICD-10-CM | POA: Diagnosis not present

## 2013-11-27 DIAGNOSIS — M79606 Pain in leg, unspecified: Secondary | ICD-10-CM | POA: Diagnosis present

## 2013-11-27 LAB — CBC WITH DIFFERENTIAL/PLATELET
Basophils Absolute: 0 10*3/uL (ref 0.0–0.1)
Basophils Relative: 1 % (ref 0–1)
Eosinophils Absolute: 0.5 10*3/uL (ref 0.0–0.7)
Eosinophils Relative: 9 % — ABNORMAL HIGH (ref 0–5)
HCT: 38.7 % — ABNORMAL LOW (ref 39.0–52.0)
HEMOGLOBIN: 13.2 g/dL (ref 13.0–17.0)
LYMPHS ABS: 2.3 10*3/uL (ref 0.7–4.0)
LYMPHS PCT: 42 % (ref 12–46)
MCH: 29.6 pg (ref 26.0–34.0)
MCHC: 34.1 g/dL (ref 30.0–36.0)
MCV: 86.8 fL (ref 78.0–100.0)
Monocytes Absolute: 0.5 10*3/uL (ref 0.1–1.0)
Monocytes Relative: 9 % (ref 3–12)
NEUTROS ABS: 2.2 10*3/uL (ref 1.7–7.7)
NEUTROS PCT: 39 % — AB (ref 43–77)
Platelets: 202 10*3/uL (ref 150–400)
RBC: 4.46 MIL/uL (ref 4.22–5.81)
RDW: 12.8 % (ref 11.5–15.5)
WBC: 5.5 10*3/uL (ref 4.0–10.5)

## 2013-11-27 LAB — BASIC METABOLIC PANEL
ANION GAP: 16 — AB (ref 5–15)
BUN: 40 mg/dL — AB (ref 6–23)
CHLORIDE: 97 meq/L (ref 96–112)
CO2: 25 mEq/L (ref 19–32)
Calcium: 10 mg/dL (ref 8.4–10.5)
Creatinine, Ser: 2.08 mg/dL — ABNORMAL HIGH (ref 0.50–1.35)
GFR calc non Af Amer: 32 mL/min — ABNORMAL LOW (ref >90)
GFR, EST AFRICAN AMERICAN: 37 mL/min — AB (ref >90)
Glucose, Bld: 103 mg/dL — ABNORMAL HIGH (ref 70–99)
POTASSIUM: 3.9 meq/L (ref 3.7–5.3)
Sodium: 138 mEq/L (ref 137–147)

## 2013-11-27 LAB — PROTIME-INR
INR: 1.07 (ref 0.00–1.49)
Prothrombin Time: 14 seconds (ref 11.6–15.2)

## 2013-11-27 LAB — URIC ACID: URIC ACID, SERUM: 9.1 mg/dL — AB (ref 4.0–7.8)

## 2013-11-27 MED ORDER — HYDROMORPHONE HCL 1 MG/ML IJ SOLN: 1.0000 mg | Freq: Once | INTRAMUSCULAR | Status: DC

## 2013-11-27 MED ORDER — BUPIVACAINE HCL (PF) 0.5 % IJ SOLN
4.0000 mL | Freq: Once | INTRAMUSCULAR | Status: DC
  Filled 2013-11-27: qty 10

## 2013-11-27 MED ORDER — HYDROMORPHONE HCL 1 MG/ML IJ SOLN
1.0000 mg | Freq: Once | INTRAMUSCULAR | Status: AC
  Administered 2013-11-27: 1 mg via INTRAMUSCULAR
  Filled 2013-11-27: qty 1

## 2013-11-27 MED ORDER — METHYLPREDNISOLONE ACETATE 40 MG/ML IJ SUSP
40.0000 mg | Freq: Once | INTRAMUSCULAR | Status: DC
  Filled 2013-11-27: qty 1

## 2013-11-27 MED ORDER — LIDOCAINE-EPINEPHRINE 1 %-1:100000 IJ SOLN
10.0000 mL | Freq: Once | INTRAMUSCULAR | Status: DC
Start: 2013-11-27 — End: 2013-11-28
  Filled 2013-11-27: qty 1

## 2013-11-27 MED ORDER — OXYCODONE HCL 5 MG PO TABS: 10.0000 mg | ORAL_TABLET | Freq: Once | ORAL | Status: DC

## 2013-11-27 NOTE — ED Provider Notes (Signed)
CSN: [id number]     Arrival date & time 11/27/13  1917 History   First MD Initiated Contact with Patient 11/27/13 1921     Chief Complaint  Patient presents with  . Leg Pain     Patient is a 63 y.o. male presenting with leg pain. The history is provided by the patient.  Leg Pain   Mr. Capuano presents for evaluation of right knee pain. He has had pain in the right knee for the last 2 nights. He has no history of trauma. Reports the pain has increased significantly today and he is no longer to bear weight. He denies history of similar symptoms. He denies fevers, nausea, vomiting. He does have a history of gout in the right foot but this feels significantly different of his gout. He states that with gout he is normally able to bear weight and today he can barely even move the knee much less bear weight. The pain in the right knee is worse with range of motion and with range of motion radiates to the right mid shin. Symptoms are severe, constant, and worsening. He has a history of heart failure and is on a milrinone drip and has a right subclavian central line that has been in place for the last 18 months. He denies any history of infection related to central line. Patient takes Eliquis.  Past Medical History  Diagnosis Date  . CHF (congestive heart failure)   . Sarcoidosis   . Cardiomyopathy, dilated, nonischemic     non ischemic by cath  . Acute on chronic systolic heart failure   . Automatic implantable cardiac defibrillator in situ   . Atrial fibrillation   . NSVT (nonsustained ventricular tachycardia)   . GERD (gastroesophageal reflux disease)   . Hypercholesteremia   . Shortness of breath   . Chronic kidney disease (CKD), stage III (moderate)   . Pacemaker   . Anginal pain   . Gout   . Hypertension     dr [name]  . Coronary artery disease   . Elevated PSA 06/24/2013  . Myocardial infarction    Past Surgical History  Procedure Laterality Date  . Pacemaker insertion  2009     with ICD  . Tee without cardioversion  01/17/2011    Procedure: TRANSESOPHAGEAL ECHOCARDIOGRAM (TEE);  Surgeon: Birdie Riddle, MD;  Location: [place];  Service: Cardiovascular;  Laterality: N/A;  . Cardioversion  01/17/2011    Procedure: CARDIOVERSION;  Surgeon: Birdie Riddle, MD;  Location: [place];  Service: Cardiovascular;  Laterality: N/A;  . Cardiac catheterization    . Insert / replace / remove pacemaker    . Anterior cervical decomp/discectomy fusion  08/21/2011    Procedure: ANTERIOR CERVICAL DECOMPRESSION/DISCECTOMY FUSION 2 LEVELS;  Surgeon: Marybelle Killings, MD;  Location: [place];  Service: Orthopedics;  Laterality: N/A;  C5-6, C6-7 Anterior Cervical Discectomy and Fusion, allograft, plate  . Tee without cardioversion N/A 02/16/2012    Procedure: TRANSESOPHAGEAL ECHOCARDIOGRAM (TEE);  Surgeon: Jolaine Artist, MD;  Location: [facility] ENDOSCOPY;  Service: Cardiovascular;  Laterality: N/A;  original case scheduled under his dad (who is deceased), rescheduled under correct mrn/pt/dob. [place]/dl  . Tee without cardioversion N/A 03/22/2012    Procedure: TRANSESOPHAGEAL ECHOCARDIOGRAM (TEE);  Surgeon: Lelon Perla, MD;  Location: [place];  Service: Cardiovascular;  Laterality: N/A;  . Cardioversion N/A 03/22/2012    Procedure: CARDIOVERSION;  Surgeon: Lelon Perla, MD;  Location: [place];  Service: Cardiovascular;  Laterality: N/A;  .  Cardioversion N/A 04/26/2012    Procedure: CARDIOVERSION;  Surgeon: Jolaine Artist, MD;  Location: [facility] ENDOSCOPY;  Service: Cardiovascular;  Laterality: N/A;  . Central venous catheter tunneled insertion single lumen  09/16/2012    right IJ  . Cardioversion N/A 11/08/2012    Procedure: CARDIOVERSION;  Surgeon: Jolaine Artist, MD;  Location: [facility] ENDOSCOPY;  Service: Cardiovascular;  Laterality: N/A;  . Back surgery  1987, lower    Ruptured disk repair   Family History  Problem Relation Age of Onset  . Heart disease    . Heart failure     . Stroke    . Anesthesia problems Neg Hx   . Hypotension Neg Hx   . Malignant hyperthermia Neg Hx   . Pseudochol deficiency Neg Hx    History  Substance Use Topics  . Smoking status: Never Smoker   . Smokeless tobacco: Never Used  . Alcohol Use: No    Review of Systems  All other systems reviewed and are negative.     Allergies  Nitroglycerin  Home Medications   Prior to Admission medications   Medication Sig Start Date End Date Taking? Authorizing Provider  allopurinol (ZYLOPRIM) 100 MG tablet Take 1 tablet (100 mg total) by mouth daily. 02/05/12   Wynetta Emery, PA-C  amiodarone (PACERONE) 200 MG tablet Take 200 mg by mouth 2 (two) times daily.     Historical Provider, MD  apixaban (ELIQUIS) 5 MG TABS tablet Take 1 tablet (5 mg total) by mouth 2 (two) times daily. 10/27/13   Larey Dresser, MD  carvedilol (COREG) 6.25 MG tablet Take 6.25 mg by mouth 2 (two) times daily with a meal.     Historical Provider, MD  eplerenone (INSPRA) 25 MG tablet Take 25 mg by mouth daily. 09/11/13   Rande Brunt, NP  fluticasone (FLONASE) 50 MCG/ACT nasal spray Place 2 sprays into both nostrils daily as needed for allergies. 10/17/13   Debbrah Alar, NP  hydrOXYzine (ATARAX/VISTARIL) 25 MG tablet Take 1 tablet (25 mg total) by mouth 2 (two) times daily as needed. 09/15/13   Jolaine Artist, MD  LORazepam (ATIVAN) 0.5 MG tablet Take 1-2 tablets (0.5-1 mg total) by mouth 2 (two) times daily as needed for anxiety. 10/17/13   Debbrah Alar, NP  Magnesium Oxide 400 (240 MG) MG TABS Take 1 tablet by mouth 2 (two) times daily. 09/11/13   Rande Brunt, NP  metolazone (ZAROXOLYN) 2.5 MG tablet Take 1 tablet (2.5 mg total) by mouth daily as needed (for fluid retention). 08/27/13   Jolaine Artist, MD  milrinone [facility]) 20 MG/100ML SOLN infusion Inject 0.375 mcg/kg/min into the vein continuous. 440mg  in 459ml infusion 2.5 ml per hour    Historical Provider, MD  NEXIUM 40 MG capsule TAKE  ONE CAPSULE BY MOUTH TWICE A DAY BEFORE A MEAL 11/13/13   Debbrah Alar, NP  Oxycodone HCl 20 MG TABS Take 1 tablet by mouth 3 (three) times daily as needed (pain.).    Historical Provider, MD  potassium chloride SA (K-DUR,KLOR-CON) 20 MEQ tablet Take 4 tabs in AM and 4 tabs in PM 09/11/13   Rande Brunt, NP  promethazine (PHENERGAN) 25 MG tablet Take 25 mg by mouth every 6 (six) hours as needed for nausea or vomiting.    Historical Provider, MD  senna-docusate (SENOKOT-S) 8.6-50 MG per tablet Take 1-2 tablets by mouth 2 (two) times daily as needed for mild constipation or moderate constipation. 10/21/13   Debbrah Alar,  NP  sucralfate (CARAFATE) 1 GM/10ML suspension Take 10 mLs (1 g total) by mouth 4 (four) times daily. 09/09/13   Janett Billow D. Zehr, PA-C  torsemide (DEMADEX) 20 MG tablet Take 20 mg by mouth See admin instructions. Takes three tablet in the morning (60mg ) and two tablet in the evening (40mg )    Historical Provider, MD  zolpidem (AMBIEN) 5 MG tablet Take 1 tablet (5 mg total) by mouth at bedtime as needed for sleep. 11/26/13   Shaune Pascal Bensimhon, MD   BP 116/84 mmHg  Pulse 86  Temp(Src) 98.4 F (36.9 C) (Oral)  Resp 22  Ht 6\' 3"  (1.905 m)  Wt 243 lb (110.224 kg)  BMI 30.37 kg/m2  SpO2 96% Physical Exam  Constitutional: He is oriented to person, place, and time. He appears well-developed and well-nourished.  HENT:  Head: Normocephalic and atraumatic.  Cardiovascular: Normal rate.   No murmur heard. Pulmonary/Chest:  No respiratory distress, decreased air movement in bases.  Central line in right upper chest with dressing c/d/i.  No local erythema.  Abdominal: Soft. There is no tenderness. There is no rebound and no guarding.  Musculoskeletal:  Moderate right knee effusion with tenderness to palpation over medial and superior aspect of knee.  Pt unable to range knee secondary to pain.  Erythema of knee without overlying cellulitis.  2+ DP pulses.    Neurological: He  is alert and oriented to person, place, and time.  Skin: Skin is warm and dry.  Psychiatric: He has a normal mood and affect. His behavior is normal.  Nursing note and vitals reviewed.   ED Course  ARTHOCENTESIS Date/Time: 11/28/2013 1:03 AM Performed by: Quintella Reichert Authorized by: Quintella Reichert Consent: Verbal consent obtained. Risks and benefits: risks, benefits and alternatives were discussed Consent given by: patient Patient understanding: patient states understanding of the procedure being performed Patient consent: the patient's understanding of the procedure matches consent given Procedure consent: procedure consent matches procedure scheduled Relevant documents: relevant documents present and verified Test results: test results available and properly labeled Site marked: the operative site was marked Imaging studies: imaging studies available Patient identity confirmed: verbally with patient Time out: Immediately prior to procedure a "time out" was called to verify the correct patient, procedure, equipment, support staff and site/side marked as required. Indications: diagnostic evaluation and joint swelling  Body area: knee Joint: right knee Local anesthesia used: yes Anesthesia: local infiltration Patient sedated: no Preparation: Patient was prepped and draped in the usual sterile fashion. Needle gauge: 18 G Ultrasound guidance: no Approach: medial Aspirate: cloudy and yellow Aspirate amount: 20 mL Patient tolerance: Patient tolerated the procedure well with no immediate complications Comments: Procedure performed by Resident, Merrily Pew, MD and supervised by myself.    (including critical care time)   Labs Review Labs Reviewed  BASIC METABOLIC PANEL - Abnormal; Notable for the following:    Glucose, Bld 103 (*)    BUN 40 (*)    Creatinine, Ser 2.08 (*)    GFR calc non Af Amer 32 (*)    GFR calc Af Amer 37 (*)    Anion gap 16 (*)    All other  components within normal limits  CBC WITH DIFFERENTIAL - Abnormal; Notable for the following:    HCT 38.7 (*)    Neutrophils Relative % 39 (*)    Eosinophils Relative 9 (*)    All other components within normal limits  URIC ACID - Abnormal; Notable for the following:  Uric Acid, Serum 9.1 (*)    All other components within normal limits  BODY FLUID CULTURE  PROTIME-INR  SYNOVIAL CELL COUNT + DIFF, W/ CRYSTALS    Imaging Review Dg Knee Complete 4 Views Right  11/27/2013   CLINICAL DATA:  One day history of pain  EXAM: RIGHT KNEE - COMPLETE 4+ VIEW  COMPARISON:  None.  FINDINGS: Frontal, lateral, and bilateral oblique views were obtained. There is no fracture or dislocation. There is a joint effusion. There is mild narrowing of the patellofemoral joint. There is spurring along the posterior aspect of the patella. There are scattered foci of intrameniscal calcification. There is a small loose body in Hoffa's fat pad.  IMPRESSION: Joint effusion present. No fracture or dislocation. Narrowing of the patellofemoral joint with areas of meniscal calcification. These are findings that may be seen with osteoarthritis. These are also findings that may be seen with calcium pyrophosphate deposition disease which may present clinically as pseudogout.   Electronically Signed   By: Lowella Grip M.D.   On: 11/27/2013 21:03     EKG Interpretation None      MDM   Final diagnoses:  Knee pain, acute, right  Inflammatory arthritis    Pt here with right knee pain.  D/w Dr. Louanne Skye pt case and concern for gout vs septic arthritis.  Dr Louanne Skye recommends checking uric acid level, treating for gout, and is ok with arthrocentesis for definitive diagnosis.  Unable to maintain joint space on arthrocentesis to provide intra-articular injection.  Synovial fluid analysis not c/w septic arthritis.  Discussed with pt home care and return precautions.     Quintella Reichert, MD 11/28/13 [phone]

## 2013-11-27 NOTE — ED Notes (Signed)
EMS reported BP 116/80, P90, 18R, 10/10 pain.   Patient has Milrinone infusing via central line.

## 2013-11-27 NOTE — ED Notes (Signed)
Patient presents via EMS with c/o right knee and leg pain.  Right knee swollen and warm to touch.  Painful when moved or with touching.  Swollen area noted to the right shin.

## 2013-11-27 NOTE — ED Notes (Signed)
Right leg above knee 19", below knee 15 1/4", above ankle 10 1/8"  Left leg above knee 18 1/4", below knee 14 3/4", above ankle 10 1/2"

## 2013-11-28 LAB — SYNOVIAL CELL COUNT + DIFF, W/ CRYSTALS
Crystals, Fluid: NONE SEEN
MONOCYTE-MACROPHAGE-SYNOVIAL FLUID: 14 % — AB (ref 50–90)
NEUTROPHIL, SYNOVIAL: 86 % — AB (ref 0–25)
WBC, Synovial: 9832 /mm3 — ABNORMAL HIGH (ref 0–200)

## 2013-11-28 MED ORDER — COLCHICINE 0.6 MG PO TABS: 0.6000 mg | ORAL_TABLET | Freq: Every day | ORAL | Status: DC

## 2013-11-28 MED ORDER — COLCHICINE 0.6 MG PO TABS
1.2000 mg | ORAL_TABLET | Freq: Once | ORAL | Status: AC
  Administered 2013-11-28: 1.2 mg via ORAL
  Filled 2013-11-28: qty 2

## 2013-11-28 NOTE — ED Notes (Signed)
Ace wrap applied to right knee

## 2013-11-28 NOTE — Discharge Instructions (Signed)

## 2013-11-28 NOTE — ED Notes (Signed)
Patient discharged home.  Discharge instructions given

## 2013-11-29 ENCOUNTER — Telehealth: Payer: Self-pay | Admitting: Family

## 2013-11-29 NOTE — Telephone Encounter (Signed)
Did he get in to see nephrology?  If so can we request consult note?

## 2013-11-30 LAB — BODY FLUID CULTURE: CULTURE: NO GROWTH

## 2013-12-01 ENCOUNTER — Telehealth (HOSPITAL_COMMUNITY): Payer: Self-pay

## 2013-12-01 NOTE — Telephone Encounter (Signed)
It was placed back on 5/18.

## 2013-12-01 NOTE — Telephone Encounter (Signed)
[place] sorry, I didn't see it on the referrals tab on the appointment desk. However I did find it in the chart. Pt was seen on May 28, I will call and get office note

## 2013-12-01 NOTE — ED Notes (Signed)
called to see who to followup with.  wrong info given at DC. Dr Maureen Ralphs was on call 11/26

## 2013-12-01 NOTE — Telephone Encounter (Signed)
Office note is in media tab

## 2013-12-01 NOTE — Telephone Encounter (Signed)
I do not see where a nephrology referral was placed, only urology.

## 2013-12-05 ENCOUNTER — Inpatient Hospital Stay (HOSPITAL_COMMUNITY): Admission: RE | Admit: 2013-12-05 | Payer: Medicare Other | Source: Ambulatory Visit

## 2013-12-09 ENCOUNTER — Inpatient Hospital Stay (HOSPITAL_COMMUNITY)
Admission: EM | Admit: 2013-12-09 | Discharge: 2013-12-11 | DRG: 292 | Disposition: A | Discharge status: Home / Self Care | Payer: Medicare Other | Attending: Internal Medicine | Admitting: Internal Medicine

## 2013-12-09 ENCOUNTER — Emergency Department (HOSPITAL_COMMUNITY): Payer: Medicare Other

## 2013-12-09 ENCOUNTER — Other Ambulatory Visit: Payer: Self-pay

## 2013-12-09 ENCOUNTER — Encounter (HOSPITAL_COMMUNITY): Payer: Self-pay | Admitting: Emergency Medicine

## 2013-12-09 DIAGNOSIS — I5023 Acute on chronic systolic (congestive) heart failure: Secondary | ICD-10-CM | POA: Diagnosis not present

## 2013-12-09 DIAGNOSIS — I509 Heart failure, unspecified: Secondary | ICD-10-CM | POA: Diagnosis not present

## 2013-12-09 DIAGNOSIS — I5022 Chronic systolic (congestive) heart failure: Secondary | ICD-10-CM | POA: Insufficient documentation

## 2013-12-09 DIAGNOSIS — I2510 Atherosclerotic heart disease of native coronary artery without angina pectoris: Secondary | ICD-10-CM | POA: Diagnosis present

## 2013-12-09 DIAGNOSIS — D869 Sarcoidosis, unspecified: Secondary | ICD-10-CM | POA: Diagnosis present

## 2013-12-09 DIAGNOSIS — I420 Dilated cardiomyopathy: Secondary | ICD-10-CM | POA: Diagnosis present

## 2013-12-09 DIAGNOSIS — K219 Gastro-esophageal reflux disease without esophagitis: Secondary | ICD-10-CM | POA: Diagnosis present

## 2013-12-09 DIAGNOSIS — Z95810 Presence of automatic (implantable) cardiac defibrillator: Secondary | ICD-10-CM

## 2013-12-09 DIAGNOSIS — R059 Cough, unspecified: Secondary | ICD-10-CM

## 2013-12-09 DIAGNOSIS — R05 Cough: Secondary | ICD-10-CM

## 2013-12-09 DIAGNOSIS — I129 Hypertensive chronic kidney disease with stage 1 through stage 4 chronic kidney disease, or unspecified chronic kidney disease: Secondary | ICD-10-CM | POA: Diagnosis present

## 2013-12-09 DIAGNOSIS — M109 Gout, unspecified: Secondary | ICD-10-CM | POA: Diagnosis present

## 2013-12-09 DIAGNOSIS — R079 Chest pain, unspecified: Secondary | ICD-10-CM

## 2013-12-09 DIAGNOSIS — I252 Old myocardial infarction: Secondary | ICD-10-CM

## 2013-12-09 DIAGNOSIS — R42 Dizziness and giddiness: Secondary | ICD-10-CM

## 2013-12-09 DIAGNOSIS — I493 Ventricular premature depolarization: Secondary | ICD-10-CM | POA: Diagnosis present

## 2013-12-09 DIAGNOSIS — N184 Chronic kidney disease, stage 4 (severe): Secondary | ICD-10-CM | POA: Diagnosis present

## 2013-12-09 DIAGNOSIS — E780 Pure hypercholesterolemia: Secondary | ICD-10-CM | POA: Diagnosis present

## 2013-12-09 DIAGNOSIS — I480 Paroxysmal atrial fibrillation: Secondary | ICD-10-CM | POA: Diagnosis present

## 2013-12-09 HISTORY — DX: Polyneuropathy, unspecified: G62.9

## 2013-12-09 HISTORY — DX: Headache: R51

## 2013-12-09 HISTORY — DX: Headache, unspecified: R51.9

## 2013-12-09 LAB — BASIC METABOLIC PANEL
Anion gap: 19 — ABNORMAL HIGH (ref 5–15)
BUN: 49 mg/dL — AB (ref 6–23)
CALCIUM: 9.8 mg/dL (ref 8.4–10.5)
CO2: 18 mEq/L — ABNORMAL LOW (ref 19–32)
Chloride: 103 mEq/L (ref 96–112)
Creatinine, Ser: 2.2 mg/dL — ABNORMAL HIGH (ref 0.50–1.35)
GFR calc Af Amer: 35 mL/min — ABNORMAL LOW (ref >90)
GFR calc non Af Amer: 30 mL/min — ABNORMAL LOW (ref >90)
GLUCOSE: 170 mg/dL — AB (ref 70–99)
POTASSIUM: 3.9 meq/L (ref 3.7–5.3)
SODIUM: 140 meq/L (ref 137–147)

## 2013-12-09 LAB — CBC
HCT: 40.5 % (ref 39.0–52.0)
HEMOGLOBIN: 14 g/dL (ref 13.0–17.0)
MCH: 29.7 pg (ref 26.0–34.0)
MCHC: 34.6 g/dL (ref 30.0–36.0)
MCV: 85.8 fL (ref 78.0–100.0)
Platelets: 231 10*3/uL (ref 150–400)
RBC: 4.72 MIL/uL (ref 4.22–5.81)
RDW: 13.2 % (ref 11.5–15.5)
WBC: 5.7 10*3/uL (ref 4.0–10.5)

## 2013-12-09 LAB — I-STAT TROPONIN, ED: Troponin i, poc: 0.01 ng/mL (ref 0.00–0.08)

## 2013-12-09 LAB — PRO B NATRIURETIC PEPTIDE: PRO B NATRI PEPTIDE: 665.8 pg/mL — AB (ref 0–125)

## 2013-12-09 MED ORDER — ASPIRIN 81 MG PO CHEW
324.0000 mg | CHEWABLE_TABLET | Freq: Once | ORAL | Status: AC
  Administered 2013-12-09: 324 mg via ORAL
  Filled 2013-12-09: qty 4

## 2013-12-09 MED ORDER — MECLIZINE HCL 25 MG PO TABS
25.0000 mg | ORAL_TABLET | Freq: Once | ORAL | Status: AC
  Administered 2013-12-09: 25 mg via ORAL
  Filled 2013-12-09: qty 1

## 2013-12-09 MED ORDER — ONDANSETRON HCL 4 MG/2ML IJ SOLN
4.0000 mg | Freq: Once | INTRAMUSCULAR | Status: AC
  Administered 2013-12-09: 4 mg via INTRAVENOUS
  Filled 2013-12-09: qty 2

## 2013-12-09 NOTE — ED Notes (Signed)
Patient with chest pain and shortness of breath.  Patient has had nausea and vomiting with it.  Patient having itchiness and hot flashes and dizziness.

## 2013-12-09 NOTE — ED Provider Notes (Signed)
CSN: [id number]     Arrival date & time 12/09/13  2228 History   First MD Initiated Contact with Patient 12/09/13 2326     Chief Complaint  Patient presents with  . Chest Pain  . Shortness of Breath     (Consider location/radiation/quality/duration/timing/severity/associated sxs/prior Treatment) Patient is a 63 y.o. male presenting with chest pain and shortness of breath. The history is provided by the patient.  Chest Pain Associated symptoms: shortness of breath   Shortness of Breath Associated symptoms: chest pain   He is the patient in the heart failure clinic and has an implanted defibrillator. Last night, the defibrillator fired. He has been having problems with dizziness for the last 2 days which she describes as a spinning sensation which is worse with change in body position. He has chronic chest pain, but today he noted worsening of the chest pain. He described as a heavy feeling in his chest with radiation to his left arm with occasional sharp pains. He rated pain at 9/10. He has dyspnea which he states is not really any different from his baseline. He has had nausea and vomiting. He has felt hot but has not had any diaphoresis. He does relate that recently his weight had gone up 4 pounds and he took a dose of metolazone. Overall, his weight has been stable.  Past Medical History  Diagnosis Date  . CHF (congestive heart failure)   . Sarcoidosis   . Cardiomyopathy, dilated, nonischemic     non ischemic by cath  . Acute on chronic systolic heart failure   . Automatic implantable cardiac defibrillator in situ   . Atrial fibrillation   . NSVT (nonsustained ventricular tachycardia)   . GERD (gastroesophageal reflux disease)   . Hypercholesteremia   . Shortness of breath   . Chronic kidney disease (CKD), stage III (moderate)   . Pacemaker   . Anginal pain   . Gout   . Hypertension     dr [name]  . Coronary artery disease   . Elevated PSA 06/24/2013  . Myocardial  infarction    Past Surgical History  Procedure Laterality Date  . Pacemaker insertion  2009    with ICD  . Tee without cardioversion  01/17/2011    Procedure: TRANSESOPHAGEAL ECHOCARDIOGRAM (TEE);  Surgeon: Birdie Riddle, MD;  Location: [facility];  Service: Cardiovascular;  Laterality: N/A;  . Cardioversion  01/17/2011    Procedure: CARDIOVERSION;  Surgeon: Birdie Riddle, MD;  Location: [facility];  Service: Cardiovascular;  Laterality: N/A;  . Cardiac catheterization    . Insert / replace / remove pacemaker    . Anterior cervical decomp/discectomy fusion  08/21/2011    Procedure: ANTERIOR CERVICAL DECOMPRESSION/DISCECTOMY FUSION 2 LEVELS;  Surgeon: Marybelle Killings, MD;  Location: [facility];  Service: Orthopedics;  Laterality: N/A;  C5-6, C6-7 Anterior Cervical Discectomy and Fusion, allograft, plate  . Tee without cardioversion N/A 02/16/2012    Procedure: TRANSESOPHAGEAL ECHOCARDIOGRAM (TEE);  Surgeon: Jolaine Artist, MD;  Location: [facility] ENDOSCOPY;  Service: Cardiovascular;  Laterality: N/A;  original case scheduled under his dad (who is deceased), rescheduled under correct mrn/pt/dob. [place]/dl  . Tee without cardioversion N/A 03/22/2012    Procedure: TRANSESOPHAGEAL ECHOCARDIOGRAM (TEE);  Surgeon: Lelon Perla, MD;  Location: [facility] ENDOSCOPY;  Service: Cardiovascular;  Laterality: N/A;  . Cardioversion N/A 03/22/2012    Procedure: CARDIOVERSION;  Surgeon: Lelon Perla, MD;  Location: [place];  Service: Cardiovascular;  Laterality: N/A;  . Cardioversion N/A 04/26/2012  Procedure: CARDIOVERSION;  Surgeon: Jolaine Artist, MD;  Location: [facility] ENDOSCOPY;  Service: Cardiovascular;  Laterality: N/A;  . Central venous catheter tunneled insertion single lumen  09/16/2012    right IJ  . Cardioversion N/A 11/08/2012    Procedure: CARDIOVERSION;  Surgeon: Jolaine Artist, MD;  Location: [facility] ENDOSCOPY;  Service: Cardiovascular;  Laterality: N/A;  . Back surgery  1987, lower    Ruptured disk  repair   Family History  Problem Relation Age of Onset  . Heart disease    . Heart failure    . Stroke    . Anesthesia problems Neg Hx   . Hypotension Neg Hx   . Malignant hyperthermia Neg Hx   . Pseudochol deficiency Neg Hx    History  Substance Use Topics  . Smoking status: Never Smoker   . Smokeless tobacco: Never Used  . Alcohol Use: No    Review of Systems  Respiratory: Positive for shortness of breath.   Cardiovascular: Positive for chest pain.  All other systems reviewed and are negative.     Allergies  Nitroglycerin  Home Medications   Prior to Admission medications   Medication Sig Start Date End Date Taking? Authorizing Provider  allopurinol (ZYLOPRIM) 100 MG tablet Take 1 tablet (100 mg total) by mouth daily. 02/05/12   Wynetta Emery, PA-C  amiodarone (PACERONE) 200 MG tablet Take 200 mg by mouth 2 (two) times daily.     Historical Provider, MD  apixaban (ELIQUIS) 5 MG TABS tablet Take 1 tablet (5 mg total) by mouth 2 (two) times daily. 10/27/13   Larey Dresser, MD  carvedilol (COREG) 6.25 MG tablet Take 6.25 mg by mouth 2 (two) times daily with a meal.     Historical Provider, MD  colchicine 0.6 MG tablet Take 1 tablet (0.6 mg total) by mouth daily. 11/28/13   Quintella Reichert, MD  eplerenone (INSPRA) 25 MG tablet Take 25 mg by mouth daily. 09/11/13   Rande Brunt, NP  fluticasone (FLONASE) 50 MCG/ACT nasal spray Place 2 sprays into both nostrils daily as needed for allergies. 10/17/13   Debbrah Alar, NP  hydrOXYzine (ATARAX/VISTARIL) 25 MG tablet Take 1 tablet (25 mg total) by mouth 2 (two) times daily as needed. 09/15/13   Jolaine Artist, MD  LORazepam (ATIVAN) 0.5 MG tablet Take 1-2 tablets (0.5-1 mg total) by mouth 2 (two) times daily as needed for anxiety. 10/17/13   Debbrah Alar, NP  Magnesium Oxide 400 (240 MG) MG TABS Take 1 tablet by mouth 2 (two) times daily. 09/11/13   Rande Brunt, NP  metolazone (ZAROXOLYN) 2.5 MG tablet Take 1  tablet (2.5 mg total) by mouth daily as needed (for fluid retention). 08/27/13   Jolaine Artist, MD  milrinone [facility]) 20 MG/100ML SOLN infusion Inject 0.375 mcg/kg/min into the vein continuous. 440mg  in 496ml infusion 2.5 ml per hour    Historical Provider, MD  NEXIUM 40 MG capsule TAKE ONE CAPSULE BY MOUTH TWICE A DAY BEFORE A MEAL 11/13/13   Debbrah Alar, NP  Oxycodone HCl 20 MG TABS Take 1 tablet by mouth 3 (three) times daily as needed (pain.).    Historical Provider, MD  potassium chloride SA (K-DUR,KLOR-CON) 20 MEQ tablet Take 4 tabs in AM and 4 tabs in PM 09/11/13   Rande Brunt, NP  promethazine (PHENERGAN) 25 MG tablet Take 25 mg by mouth every 6 (six) hours as needed for nausea or vomiting.    Historical Provider, MD  senna-docusate (SENOKOT-S) 8.6-50 MG per tablet Take 1-2 tablets by mouth 2 (two) times daily as needed for mild constipation or moderate constipation. 10/21/13   Debbrah Alar, NP  sucralfate (CARAFATE) 1 GM/10ML suspension Take 10 mLs (1 g total) by mouth 4 (four) times daily. 09/09/13   Janett Billow D. Zehr, PA-C  torsemide (DEMADEX) 20 MG tablet Take 20 mg by mouth See admin instructions. Takes three tablet in the morning (60mg ) and two tablet in the evening (40mg )    Historical Provider, MD  zolpidem (AMBIEN) 5 MG tablet Take 1 tablet (5 mg total) by mouth at bedtime as needed for sleep. 11/26/13   Shaune Pascal Bensimhon, MD   BP 112/79 mmHg  Pulse 117  Temp(Src) 97.6 F (36.4 C)  Resp 18  SpO2 91% Physical Exam  Nursing note and vitals reviewed.  63 year old male, resting comfortably and in no acute distress. Vital signs are significant for tachycardia. Oxygen saturation is 91%, which is hypoxic. Head is normocephalic and atraumatic. PERRLA, EOMI. Oropharynx is clear. Neck is nontender and supple without adenopathy or JVD. Back is nontender and there is no CVA tenderness. Lungs have few bibasilar rales. Chest is nontender. Central line catheter is in  place for his milrinone infusion. Heart has regular rate and rhythm without murmur. Abdomen is soft, flat, nontender without masses or hepatosplenomegaly and peristalsis is normoactive. Extremities have no cyanosis or edema, full range of motion is present. Skin is warm and dry without rash. Neurologic: Mental status is normal, cranial nerves are intact, there are no motor or sensory deficits. Dizziness is reproduced by passive head movement.  ED Course  Procedures (including critical care time) Labs Review Results for orders placed or performed during the hospital encounter of 12/09/13  CBC  Result Value Ref Range   WBC 5.7 4.0 - 10.5 K/uL   RBC 4.72 4.22 - 5.81 MIL/uL   Hemoglobin 14.0 13.0 - 17.0 g/dL   HCT 40.5 39.0 - 52.0 %   MCV 85.8 78.0 - 100.0 fL   MCH 29.7 26.0 - 34.0 pg   MCHC 34.6 30.0 - 36.0 g/dL   RDW 13.2 11.5 - 15.5 %   Platelets 231 150 - 400 K/uL  Basic metabolic panel  Result Value Ref Range   Sodium 140 137 - 147 mEq/L   Potassium 3.9 3.7 - 5.3 mEq/L   Chloride 103 96 - 112 mEq/L   CO2 18 (L) 19 - 32 mEq/L   Glucose, Bld 170 (H) 70 - 99 mg/dL   BUN 49 (H) 6 - 23 mg/dL   Creatinine, Ser 2.20 (H) 0.50 - 1.35 mg/dL   Calcium 9.8 8.4 - 10.5 mg/dL   GFR calc non Af Amer 30 (L) >90 mL/min   GFR calc Af Amer 35 (L) >90 mL/min   Anion gap 19 (H) 5 - 15  BNP (order ONLY if patient complains of dyspnea/SOB AND you have documented it for THIS visit)  Result Value Ref Range   Pro B Natriuretic peptide (BNP) 665.8 (H) 0 - 125 pg/mL  Hepatic function panel  Result Value Ref Range   Total Protein 8.4 (H) 6.0 - 8.3 g/dL   Albumin 3.4 (L) 3.5 - 5.2 g/dL   AST 48 (H) 0 - 37 U/L   ALT 25 0 - 53 U/L   Alkaline Phosphatase 87 39 - 117 U/L   Total Bilirubin 0.4 0.3 - 1.2 mg/dL   Bilirubin, Direct <0.2 0.0 - 0.3 mg/dL   Indirect Bilirubin  NOT CALCULATED 0.3 - 0.9 mg/dL  Basic metabolic panel  Result Value Ref Range   Sodium 141 137 - 147 mEq/L   Potassium 4.0 3.7 - 5.3  mEq/L   Chloride 103 96 - 112 mEq/L   CO2 22 19 - 32 mEq/L   Glucose, Bld 120 (H) 70 - 99 mg/dL   BUN 47 (H) 6 - 23 mg/dL   Creatinine, Ser 2.14 (H) 0.50 - 1.35 mg/dL   Calcium 9.9 8.4 - 10.5 mg/dL   GFR calc non Af Amer 31 (L) >90 mL/min   GFR calc Af Amer 36 (L) >90 mL/min   Anion gap 16 (H) 5 - 15  I-stat troponin, ED (not at [facility])  Result Value Ref Range   Troponin i, poc 0.01 0.00 - 0.08 ng/mL   Comment 3            Imaging Review Ct Head Wo Contrast  12/10/2013   CLINICAL DATA:  Headache and dizziness beginning last week, no trauma or loss of consciousness.  EXAM: CT HEAD WITHOUT CONTRAST  TECHNIQUE: Contiguous axial images were obtained from the base of the skull through the vertex without intravenous contrast.  COMPARISON:  CT of the head October 07, 2011  FINDINGS: The ventricles and sulci are normal for age. No intraparenchymal hemorrhage, mass effect nor midline shift. Patchy supratentorial white matter hypodensities are within normal range for patient's age and though non-specific suggest sequelae of chronic small vessel ischemic disease. Mild basal ganglia mineralization. No acute large vascular territory infarcts. Re- demonstration of LEFT transcortical occipital lobe encephalomalacia.  No abnormal extra-axial fluid collections. Basal cisterns are patent. Moderate calcific atherosclerosis of the carotid siphons.  No skull fracture. The included ocular globes and orbital contents are non-suspicious. Trace paranasal sinus mucosal thickening without air-fluid levels. The mastoid air cells appear well-aerated. Pneumatized petrous apices.  IMPRESSION: No acute intracranial process.  Mild white matter changes suggest chronic small vessel ischemic disease. LEFT occipital encephalomalacia may be posttraumatic or, are represent remote LEFT posterior cerebral artery territory infarct.   Electronically Signed   By: Elon Alas   On: 12/10/2013 03:05   Dg Chest Port 1 View  12/09/2013    CLINICAL DATA:  Mid and left-sided chest pain. Pain down the left arm. Shortness of breath.  EXAM: PORTABLE CHEST - 1 VIEW  COMPARISON:  08/06/2013  FINDINGS: Cardiac pacemaker. Right central venous catheter with tip over the low SVC region. No pneumothorax. Shallow inspiration. Heart size and pulmonary vascularity are normal. Lungs appear clear and expanded. Postoperative changes in the cervical spine.  IMPRESSION: No active disease.   Electronically Signed   By: Lucienne Capers M.D.   On: 12/09/2013 23:17     EKG Interpretation   Date/Time:  Tuesday December 09 2013 22:31:53 EST Ventricular Rate:  114 PR Interval:  152 QRS Duration: 112 QT Interval:  404 QTC Calculation: 556 R Axis:   -65 Text Interpretation:  Sinus tachycardia with occasional Premature  ventricular complexes and Fusion complexes Incomplete right bundle branch  block Left anterior fascicular block Abnormal ECG When compared with ECG  of 11/07/2013, Nonspecific T wave abnormality is no longer Present  Confirmed by [facility]  MD, Mayan Kloepfer ([id number]) on 12/09/2013 11:31:19 PM        EKG Interpretation   Date/Time:  Wednesday December 10 2013 01:04:55 EST Ventricular Rate:  97 PR Interval:  225 QRS Duration: 117 QT Interval:  421 QTC Calculation: 535 R Axis:   -72 Text Interpretation:  Sinus rhythm Premature ventricular complexes in a  pattern of bigeminy Prolonged PR interval Left atrial enlargement Left  anterior fascicular block Low voltage, precordial leads Nonspecific T  abnormalities, lateral leads When compared with ECG of 12/09/2013,  Premature ventricular complexes in a pattern of bigeminy is now Present  Confirmed by [facility]  MD, Shalyn Koral ([id number]) on 12/10/2013 2:06:02 AM     MDM   Final diagnoses:  Dizziness  Chest pain, unspecified chest pain type    Chest pain of uncertain cause. Normal troponin and ECG not showing any ischemic changes. Dizziness appears to be peripheral vertigo and he is given a dose of  meclizine. Is also given aspirin and ondansetron. Old records are reviewed and he is class IIIB heart failure with poor prognosis.  ED workup was unremarkable. Troponin is negative, BNP is about at baseline, and ECG x-ray shows improvement and T waves compared with old ECG. However, dizziness did not improve with meclizine. Case was discussed with Dr. Radford Pax of cardiology who feels that his chest pain is unlikely to be ischemic cardiac pain and feels that he should be admitted under internal medicine service for evaluation of his dizziness. He may indeed have a posterior circulation stroke, but will be unable to get MRI because of his pacemaker-defibrillator. CT was obtained which showed no evidence of acute infarct, but CT is notoriously unreliable for posterior circulation strokes. Neurology consultation has been requested and cases been discussed with Dr. Alcario Drought of [facility] who agrees to admit the patient.  Delora Fuel, MD 28/78/67 [id number]

## 2013-12-10 ENCOUNTER — Encounter (HOSPITAL_COMMUNITY): Payer: Self-pay | Admitting: Radiology

## 2013-12-10 ENCOUNTER — Inpatient Hospital Stay (HOSPITAL_COMMUNITY): Payer: Medicare Other

## 2013-12-10 ENCOUNTER — Emergency Department (HOSPITAL_COMMUNITY): Payer: Medicare Other

## 2013-12-10 DIAGNOSIS — I429 Cardiomyopathy, unspecified: Secondary | ICD-10-CM

## 2013-12-10 DIAGNOSIS — E780 Pure hypercholesterolemia: Secondary | ICD-10-CM | POA: Diagnosis present

## 2013-12-10 DIAGNOSIS — I5023 Acute on chronic systolic (congestive) heart failure: Principal | ICD-10-CM

## 2013-12-10 DIAGNOSIS — I2510 Atherosclerotic heart disease of native coronary artery without angina pectoris: Secondary | ICD-10-CM | POA: Diagnosis present

## 2013-12-10 DIAGNOSIS — D869 Sarcoidosis, unspecified: Secondary | ICD-10-CM | POA: Diagnosis present

## 2013-12-10 DIAGNOSIS — Z95810 Presence of automatic (implantable) cardiac defibrillator: Secondary | ICD-10-CM | POA: Diagnosis not present

## 2013-12-10 DIAGNOSIS — I493 Ventricular premature depolarization: Secondary | ICD-10-CM | POA: Diagnosis present

## 2013-12-10 DIAGNOSIS — I129 Hypertensive chronic kidney disease with stage 1 through stage 4 chronic kidney disease, or unspecified chronic kidney disease: Secondary | ICD-10-CM | POA: Diagnosis present

## 2013-12-10 DIAGNOSIS — I420 Dilated cardiomyopathy: Secondary | ICD-10-CM | POA: Diagnosis present

## 2013-12-10 DIAGNOSIS — N184 Chronic kidney disease, stage 4 (severe): Secondary | ICD-10-CM | POA: Diagnosis present

## 2013-12-10 DIAGNOSIS — M109 Gout, unspecified: Secondary | ICD-10-CM | POA: Diagnosis present

## 2013-12-10 DIAGNOSIS — K219 Gastro-esophageal reflux disease without esophagitis: Secondary | ICD-10-CM | POA: Diagnosis present

## 2013-12-10 DIAGNOSIS — I509 Heart failure, unspecified: Secondary | ICD-10-CM | POA: Diagnosis present

## 2013-12-10 DIAGNOSIS — I252 Old myocardial infarction: Secondary | ICD-10-CM | POA: Diagnosis not present

## 2013-12-10 DIAGNOSIS — R42 Dizziness and giddiness: Secondary | ICD-10-CM

## 2013-12-10 DIAGNOSIS — I499 Cardiac arrhythmia, unspecified: Secondary | ICD-10-CM

## 2013-12-10 DIAGNOSIS — I480 Paroxysmal atrial fibrillation: Secondary | ICD-10-CM | POA: Diagnosis present

## 2013-12-10 DIAGNOSIS — I4891 Unspecified atrial fibrillation: Secondary | ICD-10-CM

## 2013-12-10 LAB — HEPATIC FUNCTION PANEL
ALBUMIN: 3.4 g/dL — AB (ref 3.5–5.2)
ALK PHOS: 87 U/L (ref 39–117)
ALT: 25 U/L (ref 0–53)
AST: 48 U/L — AB (ref 0–37)
Total Bilirubin: 0.4 mg/dL (ref 0.3–1.2)
Total Protein: 8.4 g/dL — ABNORMAL HIGH (ref 6.0–8.3)

## 2013-12-10 LAB — BASIC METABOLIC PANEL
Anion gap: 16 — ABNORMAL HIGH (ref 5–15)
BUN: 47 mg/dL — ABNORMAL HIGH (ref 6–23)
CALCIUM: 9.9 mg/dL (ref 8.4–10.5)
CO2: 22 mEq/L (ref 19–32)
CREATININE: 2.14 mg/dL — AB (ref 0.50–1.35)
Chloride: 103 mEq/L (ref 96–112)
GFR, EST AFRICAN AMERICAN: 36 mL/min — AB (ref >90)
GFR, EST NON AFRICAN AMERICAN: 31 mL/min — AB (ref >90)
GLUCOSE: 120 mg/dL — AB (ref 70–99)
Potassium: 4 mEq/L (ref 3.7–5.3)
Sodium: 141 mEq/L (ref 137–147)

## 2013-12-10 MED ORDER — SODIUM CHLORIDE 0.9 % IJ SOLN: 3.0000 mL | INTRAMUSCULAR | Status: DC | PRN

## 2013-12-10 MED ORDER — PANTOPRAZOLE SODIUM 40 MG PO TBEC
80.0000 mg | DELAYED_RELEASE_TABLET | Freq: Every day | ORAL | Status: DC
  Administered 2013-12-10 – 2013-12-11 (×2): 80 mg via ORAL
  Filled 2013-12-10 (×2): qty 2

## 2013-12-10 MED ORDER — ACETAMINOPHEN 325 MG PO TABS: 650.0000 mg | ORAL_TABLET | ORAL | Status: DC | PRN

## 2013-12-10 MED ORDER — SODIUM CHLORIDE 0.9 % IJ SOLN
3.0000 mL | Freq: Two times a day (BID) | INTRAMUSCULAR | Status: DC
  Administered 2013-12-10 – 2013-12-11 (×3): 3 mL via INTRAVENOUS

## 2013-12-10 MED ORDER — ALLOPURINOL 100 MG PO TABS
100.0000 mg | ORAL_TABLET | Freq: Every day | ORAL | Status: DC
  Administered 2013-12-10 – 2013-12-11 (×2): 100 mg via ORAL
  Filled 2013-12-10 (×2): qty 1

## 2013-12-10 MED ORDER — SPIRONOLACTONE 12.5 MG HALF TABLET
12.5000 mg | ORAL_TABLET | Freq: Every day | ORAL | Status: DC
  Administered 2013-12-10 – 2013-12-11 (×2): 12.5 mg via ORAL
  Filled 2013-12-10 (×2): qty 1

## 2013-12-10 MED ORDER — AMIODARONE HCL 200 MG PO TABS
200.0000 mg | ORAL_TABLET | Freq: Two times a day (BID) | ORAL | Status: DC
  Administered 2013-12-10 – 2013-12-11 (×3): 200 mg via ORAL
  Filled 2013-12-10 (×4): qty 1

## 2013-12-10 MED ORDER — OXYCODONE HCL 5 MG PO TABS
20.0000 mg | ORAL_TABLET | Freq: Three times a day (TID) | ORAL | Status: DC | PRN
  Administered 2013-12-10 (×2): 20 mg via ORAL
  Filled 2013-12-10 (×2): qty 4

## 2013-12-10 MED ORDER — MILRINONE IN DEXTROSE 20 MG/100ML IV SOLN
0.3750 ug/kg/min | INTRAVENOUS | Status: DC
  Administered 2013-12-10 – 2013-12-11 (×4): 0.375 ug/kg/min via INTRAVENOUS
  Filled 2013-12-10 (×4): qty 100

## 2013-12-10 MED ORDER — CARVEDILOL 6.25 MG PO TABS
6.2500 mg | ORAL_TABLET | Freq: Two times a day (BID) | ORAL | Status: DC
  Administered 2013-12-10 – 2013-12-11 (×3): 6.25 mg via ORAL
  Filled 2013-12-10 (×6): qty 1

## 2013-12-10 MED ORDER — APIXABAN 5 MG PO TABS
5.0000 mg | ORAL_TABLET | Freq: Two times a day (BID) | ORAL | Status: DC
  Administered 2013-12-10 – 2013-12-11 (×3): 5 mg via ORAL
  Filled 2013-12-10 (×4): qty 1

## 2013-12-10 MED ORDER — ONDANSETRON HCL 4 MG/2ML IJ SOLN: 4.0000 mg | Freq: Four times a day (QID) | INTRAMUSCULAR | Status: DC | PRN

## 2013-12-10 MED ORDER — ZOLPIDEM TARTRATE 5 MG PO TABS: 5.0000 mg | ORAL_TABLET | Freq: Every evening | ORAL | Status: DC | PRN

## 2013-12-10 MED ORDER — FLUTICASONE PROPIONATE 50 MCG/ACT NA SUSP: 2.0000 | Freq: Every day | NASAL | Status: DC | PRN

## 2013-12-10 MED ORDER — COLCHICINE 0.6 MG PO TABS
0.6000 mg | ORAL_TABLET | Freq: Every day | ORAL | Status: DC
  Filled 2013-12-10 (×2): qty 1

## 2013-12-10 MED ORDER — SENNOSIDES-DOCUSATE SODIUM 8.6-50 MG PO TABS
1.0000 | ORAL_TABLET | Freq: Two times a day (BID) | ORAL | Status: DC | PRN
  Administered 2013-12-11: 2 via ORAL
  Filled 2013-12-10 (×2): qty 2

## 2013-12-10 MED ORDER — PROMETHAZINE HCL 25 MG PO TABS: 25.0000 mg | ORAL_TABLET | Freq: Four times a day (QID) | ORAL | Status: DC | PRN

## 2013-12-10 MED ORDER — POTASSIUM CHLORIDE CRYS ER 20 MEQ PO TBCR
80.0000 meq | EXTENDED_RELEASE_TABLET | Freq: Two times a day (BID) | ORAL | Status: DC
Start: 2013-12-10 — End: 2013-12-11
  Administered 2013-12-10 – 2013-12-11 (×3): 80 meq via ORAL
  Filled 2013-12-10 (×4): qty 4

## 2013-12-10 MED ORDER — SODIUM CHLORIDE 0.9 % IJ SOLN
10.0000 mL | INTRAMUSCULAR | Status: DC | PRN
  Administered 2013-12-10: 20 mL
  Administered 2013-12-11: 10 mL
  Filled 2013-12-10: qty 40

## 2013-12-10 MED ORDER — TORSEMIDE 20 MG PO TABS
60.0000 mg | ORAL_TABLET | Freq: Every day | ORAL | Status: DC
  Administered 2013-12-10 – 2013-12-11 (×2): 60 mg via ORAL
  Filled 2013-12-10 (×4): qty 3

## 2013-12-10 MED ORDER — MAGNESIUM OXIDE 400 (241.3 MG) MG PO TABS
400.0000 mg | ORAL_TABLET | Freq: Two times a day (BID) | ORAL | Status: DC
  Administered 2013-12-10 – 2013-12-11 (×3): 400 mg via ORAL
  Filled 2013-12-10 (×4): qty 1

## 2013-12-10 MED ORDER — SODIUM CHLORIDE 0.9 % IV SOLN: 250.0000 mL | INTRAVENOUS | Status: DC | PRN

## 2013-12-10 MED ORDER — TORSEMIDE 20 MG PO TABS
40.0000 mg | ORAL_TABLET | Freq: Every evening | ORAL | Status: DC
  Administered 2013-12-10: 40 mg via ORAL
  Filled 2013-12-10 (×2): qty 2

## 2013-12-10 MED ORDER — HYDROXYZINE HCL 25 MG PO TABS
25.0000 mg | ORAL_TABLET | Freq: Two times a day (BID) | ORAL | Status: DC | PRN
  Administered 2013-12-10: 25 mg via ORAL
  Filled 2013-12-10 (×3): qty 1

## 2013-12-10 MED ORDER — SUCRALFATE 1 GM/10ML PO SUSP
1.0000 g | Freq: Three times a day (TID) | ORAL | Status: DC
  Administered 2013-12-10 – 2013-12-11 (×6): 1 g via ORAL
  Filled 2013-12-10 (×10): qty 10

## 2013-12-10 MED ORDER — TORSEMIDE 20 MG PO TABS: 40.0000 mg | ORAL_TABLET | Freq: Every evening | ORAL | Status: DC

## 2013-12-10 MED ORDER — LORAZEPAM 0.5 MG PO TABS: 0.5000 mg | ORAL_TABLET | Freq: Three times a day (TID) | ORAL | Status: DC | PRN

## 2013-12-10 MED ORDER — OXYCODONE HCL 20 MG PO TABS: 1.0000 | ORAL_TABLET | Freq: Three times a day (TID) | ORAL | Status: DC | PRN

## 2013-12-10 MED ORDER — FUROSEMIDE 10 MG/ML IJ SOLN
80.0000 mg | Freq: Once | INTRAMUSCULAR | Status: AC
  Administered 2013-12-10: 80 mg via INTRAVENOUS
  Filled 2013-12-10: qty 8

## 2013-12-10 MED ORDER — BENZONATATE 100 MG PO CAPS
100.0000 mg | ORAL_CAPSULE | Freq: Three times a day (TID) | ORAL | Status: DC | PRN
  Administered 2013-12-10 – 2013-12-11 (×3): 100 mg via ORAL
  Filled 2013-12-10 (×5): qty 1

## 2013-12-10 MED ORDER — LORAZEPAM 1 MG PO TABS
0.5000 mg | ORAL_TABLET | Freq: Two times a day (BID) | ORAL | Status: DC | PRN
  Administered 2013-12-11: 1 mg via ORAL
  Filled 2013-12-10: qty 1

## 2013-12-10 NOTE — Progress Notes (Signed)
Pt c./o of coughing yellow sputum, moderate amt and would like something for it.  Dr. Coralyn Pear text message sent to Dr. Coralyn Pear.  Will continue to monitor.  Karie Kirks, Therapist, sports.

## 2013-12-10 NOTE — Progress Notes (Signed)
   Dr. Theodosia Blender H&P reviewed. Patient seen and examined.   Volume status looks ok. He c/o some vertigo which seems to be getting better. Head CT without acute process.   Have contacted [employer] to interrogate ICD - ? ICD shock or recurrent AF as driver of symptoms.  Spoke with Dr. Coralyn Pear. Possibly home in am, if stable.   Daniel Bensimhon,MD 10:06 AM

## 2013-12-10 NOTE — Progress Notes (Signed)
MEDICATION RELATED CONSULT NOTE - INITIAL   Pharmacy Consult for Continuation of Home Milrinone Indication: NICM (EF 20-25%)  Allergies  Allergen Reactions  . Nitroglycerin Other (See Comments)    "feels like head is going to bust open"    Patient Measurements: Height: 6\' 4"  (193 cm) IBW/kg (Calculated) : 86.8  Vital Signs: Temp: 97.4 F (36.3 C) (12/09 0605) Temp Source: Oral (12/09 0605) BP: 108/71 mmHg (12/09 0605) Pulse Rate: 91 (12/09 0605)  Labs:  Recent Labs  12/09/13 2250 12/10/13 0406  WBC 5.7  --   HGB 14.0  --   HCT 40.5  --   PLT 231  --   CREATININE 2.20* 2.14*  ALBUMIN 3.4*  --   PROT 8.4*  --   AST 48*  --   ALT 25  --   ALKPHOS 87  --   BILITOT 0.4  --   BILIDIR <0.2  --   IBILI NOT CALCULATED  --    Estimated Creatinine Clearance: 48.1 mL/min (by C-G formula based on Cr of 2.14).   Medical History: Past Medical History  Diagnosis Date  . CHF (congestive heart failure)   . Sarcoidosis   . Cardiomyopathy, dilated, nonischemic     non ischemic by cath  . Acute on chronic systolic heart failure   . Automatic implantable cardiac defibrillator in situ   . Atrial fibrillation   . NSVT (nonsustained ventricular tachycardia)   . GERD (gastroesophageal reflux disease)   . Hypercholesteremia   . Shortness of breath   . Chronic kidney disease (CKD), stage III (moderate)   . Pacemaker   . Anginal pain   . Gout   . Hypertension     dr [name]  . Coronary artery disease   . Elevated PSA 06/24/2013  . Myocardial infarction      Assessment: Acute on chronic systolic HF due to NICM (EF 20-25%), on home milrinone   Plan:  -Continue milrinone at 0.375 mcg/kg/min -Monitor BP, HR, SCr  Alyne Martinson 12/10/2013,6:07 AM

## 2013-12-10 NOTE — Progress Notes (Signed)
Pt Hr 130-140 symptomatic sitting edge of bed pt states he feels like he's in afib. Pt SOB and c/o palpitations. While in room pts. Hr back to 80-90's. Deatra Canter, NP  No new orders at this time. Cont to monitor.

## 2013-12-10 NOTE — Plan of Care (Signed)
Problem: Phase I Progression Outcomes Goal: Voiding-avoid urinary catheter unless indicated Outcome: Completed/Met Date Met:  12/10/13     

## 2013-12-10 NOTE — Care Management Note (Signed)
    Page 1 of 2   12/11/2013     3:04:07 PM CARE MANAGEMENT NOTE 12/11/2013  Patient:  [name], [name]   Account Number:  [id number]  Date Initiated:  12/10/2013  Documentation initiated by:  Solange Emry  Subjective/Objective Assessment:   Pt adm on 12/09/13 with COPD, CHF.  PTA, pt resides at home with his mom and sister.   He is on home milrinone drip, managed by [facility].  They are aware of his hospitalization.     Action/Plan:   Pt is managed by Dr. Sung Amabile in [facility] CHF clinic, and will follow at dc.  Pt denies any needs for home currently.   Anticipated DC Date:  12/13/2013   Anticipated DC Plan:  [place]  CM consult      [facility] Choice  Resumption Of Svcs/PTA Provider   Choice offered to / List presented to:  C-1 Patient   DME arranged  IV PUMP/EQUIPMENT      DME agency  [place] arranged  HH-1 RN      [place].   Status of service:  Completed, signed off Medicare Important Message given?  NA - LOS <3 / Initial given by admissions (If response is "NO", the following Medicare IM given date fields will be blank) Date Medicare IM given:   Medicare IM given by:   Date Additional Medicare IM given:   Additional Medicare IM given by:    Discharge Disposition:  [place]  Per UR Regulation:  Reviewed for med. necessity/level of care/duration of stay  If discussed at [place] of Stay Meetings, dates discussed:    Comments:  12/11/13 Ellan Lambert, RN, BSN [phone] Pt for dc home today.  Notified Pam with [facility] of discharge and need for reconnection to milrinone drip.  [place] IV nurse to bring pump/IV milrinone to floor to reconnect drip prior to dc home.  Floor nurse aware of plans.

## 2013-12-10 NOTE — Plan of Care (Signed)
Problem: Phase I Progression Outcomes Goal: Dyspnea controlled at rest (HF) Outcome: Completed/Met Date Met:  12/10/13

## 2013-12-10 NOTE — Progress Notes (Signed)
[facility] PROGRESS NOTE  [name] YNW:[id number] DOB: 09-17-1950 DOA: 12/09/2013 PCP: Nance Pear., NP  Assessment/Plan: 1. Acute on chronic systolic congestive heart failure. -Patient with history of systolic congestive heart failure having ejection fraction of 20-25%, requiring milrinone drip at baseline, presenting with complaints of shortness of breath -There was concern about the possibility of ICD discharge -His pacemaker was interrogated, did not show recent shocks -Will continue Demadex 40 mg by mouth every afternoon and 60 mg by mouth every morning, Aldactone 12.5 mg daily -Patient on milrinone drip, pharmacy consult -Continue carvedilol 6.25 mg by mouth twice a day -Device was interrogated, no shocks have recently been delivered  2.  Atrial fibrillation -Rhythm controlled. Continue amiodarone 200 mg by mouth twice a day -Continue anticoagulation with Eliquis 5 mg by mouth twice a day  3.  Vertigo. -Patient reporting dizziness that tends to be positional -Seems consistent with benign positional vertigo -Device interrogation did not show delivery of shocks -Plan to monitor overnight  4.  Stage IV chronic kidney disease. -Kidney function stable, labs showing creatinine of 2.1 for which is slightly better than his baseline  5.  Hypertension -Blood pressure stable, continue Coreg 6.25 mg by mouth twice a day   Code Status: Full code Family Communication:  Disposition Plan: Anticipate discharge in the next 24 hours if remains stable/continues to improve   Consultants:  Heart failure team   HPI/Subjective: Patient is a pleasant 63 year old with a past medical history of nonischemic cardiomyopathy having an ejection fraction of 20-25% based on transthoracic echocardiogram from 06/11/2013, status post AICD placement, requiring milrinone drip at baseline, admitted to the medicine service on 12/10/2013 presenting with complaints of shortness of  breath. Symptoms likely reflecting congestive heart failure exacerbation. Patient was administered 80 mg of IV Lasix 1 in the emergency department, then continued on Demadex and Aldactone. He was seen and evaluated by the heart failure team during this hospitalization.  Objective: Filed Vitals:   12/10/13 1516  BP: 106/68  Pulse:   Temp: 98.3 F (36.8 C)  Resp: 18    Intake/Output Summary (Last 24 hours) at 12/10/13 1519 Last data filed at 12/10/13 6578  Gross per 24 hour  Intake    240 ml  Output   1010 ml  Net   -770 ml   Filed Weights   12/10/13 0605  Weight: 110.224 kg (243 lb)    Exam:   General:  Patient is no acute distress, awake alert oriented  Cardiovascular: Regular rate and rhythm normal S1-S2  Respiratory: Bibasilar crackles, no wheezing rhonchi or rales, normal respiratory effort  Abdomen: Soft nontender nondistended  Musculoskeletal: Trace edema to lower extremities bilaterally  Data Reviewed: Basic Metabolic Panel:  Recent Labs Lab 12/09/13 2250 12/10/13 0406  NA 140 141  K 3.9 4.0  CL 103 103  CO2 18* 22  GLUCOSE 170* 120*  BUN 49* 47*  CREATININE 2.20* 2.14*  CALCIUM 9.8 9.9   Liver Function Tests:  Recent Labs Lab 12/09/13 2250  AST 48*  ALT 25  ALKPHOS 87  BILITOT 0.4  PROT 8.4*  ALBUMIN 3.4*   No results for input(s): LIPASE, AMYLASE in the last 168 hours. No results for input(s): AMMONIA in the last 168 hours. CBC:  Recent Labs Lab 12/09/13 2250  WBC 5.7  HGB 14.0  HCT 40.5  MCV 85.8  PLT 231   Cardiac Enzymes: No results for input(s): CKTOTAL, CKMB, CKMBINDEX, TROPONINI in the last 168 hours. BNP (last 3 results)  Recent Labs  06/11/13 1254 08/06/13 1245 12/09/13 2250  PROBNP 542.4* 3225.0* 665.8*   CBG: No results for input(s): GLUCAP in the last 168 hours.  No results found for this or any previous visit (from the past 240 hour(s)).   Studies: Ct Head Wo Contrast  12/10/2013   CLINICAL DATA:   Headache and dizziness beginning last week, no trauma or loss of consciousness.  EXAM: CT HEAD WITHOUT CONTRAST  TECHNIQUE: Contiguous axial images were obtained from the base of the skull through the vertex without intravenous contrast.  COMPARISON:  CT of the head October 07, 2011  FINDINGS: The ventricles and sulci are normal for age. No intraparenchymal hemorrhage, mass effect nor midline shift. Patchy supratentorial white matter hypodensities are within normal range for patient's age and though non-specific suggest sequelae of chronic small vessel ischemic disease. Mild basal ganglia mineralization. No acute large vascular territory infarcts. Re- demonstration of LEFT transcortical occipital lobe encephalomalacia.  No abnormal extra-axial fluid collections. Basal cisterns are patent. Moderate calcific atherosclerosis of the carotid siphons.  No skull fracture. The included ocular globes and orbital contents are non-suspicious. Trace paranasal sinus mucosal thickening without air-fluid levels. The mastoid air cells appear well-aerated. Pneumatized petrous apices.  IMPRESSION: No acute intracranial process.  Mild white matter changes suggest chronic small vessel ischemic disease. LEFT occipital encephalomalacia may be posttraumatic or, are represent remote LEFT posterior cerebral artery territory infarct.   Electronically Signed   By: Elon Alas   On: 12/10/2013 03:05   Dg Chest Port 1 View  12/09/2013   CLINICAL DATA:  Mid and left-sided chest pain. Pain down the left arm. Shortness of breath.  EXAM: PORTABLE CHEST - 1 VIEW  COMPARISON:  08/06/2013  FINDINGS: Cardiac pacemaker. Right central venous catheter with tip over the low SVC region. No pneumothorax. Shallow inspiration. Heart size and pulmonary vascularity are normal. Lungs appear clear and expanded. Postoperative changes in the cervical spine.  IMPRESSION: No active disease.   Electronically Signed   By: Lucienne Capers M.D.   On: 12/09/2013  23:17    Scheduled Meds: . allopurinol  100 mg Oral Daily  . amiodarone  200 mg Oral BID  . apixaban  5 mg Oral BID  . carvedilol  6.25 mg Oral BID WC  . colchicine  0.6 mg Oral Daily  . magnesium oxide  400 mg Oral BID  . pantoprazole  80 mg Oral Q1200  . potassium chloride SA  80 mEq Oral BID  . sodium chloride  3 mL Intravenous Q12H  . spironolactone  12.5 mg Oral Daily  . sucralfate  1 g Oral TID WC & HS  . torsemide  40 mg Oral QPM  . torsemide  60 mg Oral Q breakfast   Continuous Infusions: . milrinone      Principal Problem:   Systolic CHF, acute on chronic Active Problems:   Cardiomyopathy, dilated, nonischemic   Atrial arrhythmia   CKD (chronic kidney disease) stage 4, GFR 15-29 ml/min   Vertigo   Acute on chronic systolic CHF (congestive heart failure), NYHA class 3    Time spent:     Kelvin Cellar  [facility] Pager [phone]. If 7PM-7AM, please contact night-coverage at [email or web address], password [facility] 12/10/2013, 3:19 PM  LOS: 1 day

## 2013-12-10 NOTE — Progress Notes (Signed)
Unable to start Milrinone gtt as pt's has home picc with med currently infusing.  New order to change to floor pump not done yet as Home Health nurse will come see pt to change over med to floor pump.  Spoke with Patty with [facility] and she instructed that [place], [facility] nurse will be in to do so.  Deatra Canter, NP made aware and instructed "it was fine."  Will continue to monitor.  Karie Kirks, Therapist, sports.

## 2013-12-10 NOTE — Progress Notes (Signed)
ICD interrogated:   No shocks. Patient has increased V rate unclear etiology (no atrial lead). Will place monitor zone up to 170 with no treatment. Continue to follow.   Junie Bame B  NP-C  12:05 PM

## 2013-12-10 NOTE — H&P (Signed)
[facility] History and Physical  [name] NOI:[id number] DOB: 12-Feb-1950 DOA: 12/09/2013  Referring physician: EDP PCP: Nance Pear., NP   Chief Complaint: CHF exacerbation   HPI: [name] is a 63 y.o. male with h/o NICM EF 20-25%, s/p AICD placement, milrinone gtt at baseline, numerous hospital admissions for CHF.  Seen at [facility], not a transplant candidate due to renal failure and lack of family support.  Patient presents to ED with c/o vertigo, chest pressure and SOB.  He states he developed dizziness over the past 2 days, spinning sensation that is worse with change in body position.  Last evening his defibrillator fired he says and the dizziness has been constant since then.  Additionally his chronic chest pain, has been worse following the defibrillator firing.  Recently his weight had gone up 4 lbs and so he took a dose of metolazone.  SOB isnt really any different from baseline he states.  Review of Systems: Systems reviewed.  As above, otherwise negative  Past Medical History  Diagnosis Date  . CHF (congestive heart failure)   . Sarcoidosis   . Cardiomyopathy, dilated, nonischemic     non ischemic by cath  . Acute on chronic systolic heart failure   . Automatic implantable cardiac defibrillator in situ   . Atrial fibrillation   . NSVT (nonsustained ventricular tachycardia)   . GERD (gastroesophageal reflux disease)   . Hypercholesteremia   . Shortness of breath   . Chronic kidney disease (CKD), stage III (moderate)   . Pacemaker   . Anginal pain   . Gout   . Hypertension     dr [name]  . Coronary artery disease   . Elevated PSA 06/24/2013  . Myocardial infarction    Past Surgical History  Procedure Laterality Date  . Pacemaker insertion  2009    with ICD  . Tee without cardioversion  01/17/2011    Procedure: TRANSESOPHAGEAL ECHOCARDIOGRAM (TEE);  Surgeon: Birdie Riddle, MD;  Location: [place];  Service: Cardiovascular;   Laterality: N/A;  . Cardioversion  01/17/2011    Procedure: CARDIOVERSION;  Surgeon: Birdie Riddle, MD;  Location: [place];  Service: Cardiovascular;  Laterality: N/A;  . Cardiac catheterization    . Insert / replace / remove pacemaker    . Anterior cervical decomp/discectomy fusion  08/21/2011    Procedure: ANTERIOR CERVICAL DECOMPRESSION/DISCECTOMY FUSION 2 LEVELS;  Surgeon: Marybelle Killings, MD;  Location: [place];  Service: Orthopedics;  Laterality: N/A;  C5-6, C6-7 Anterior Cervical Discectomy and Fusion, allograft, plate  . Tee without cardioversion N/A 02/16/2012    Procedure: TRANSESOPHAGEAL ECHOCARDIOGRAM (TEE);  Surgeon: Jolaine Artist, MD;  Location: [facility] ENDOSCOPY;  Service: Cardiovascular;  Laterality: N/A;  original case scheduled under his dad (who is deceased), rescheduled under correct mrn/pt/dob. [place]/dl  . Tee without cardioversion N/A 03/22/2012    Procedure: TRANSESOPHAGEAL ECHOCARDIOGRAM (TEE);  Surgeon: Lelon Perla, MD;  Location: [place];  Service: Cardiovascular;  Laterality: N/A;  . Cardioversion N/A 03/22/2012    Procedure: CARDIOVERSION;  Surgeon: Lelon Perla, MD;  Location[facility] END[facility]COPY;  Service: Cardiovascular;  Laterality: N/A;  . Cardioversion N/A 04/26/2012    Procedure: CARDIOVERSION;  Surgeon: Jolaine Artist, MD;  Location: [facility] ENDOSCOPY;  Service: Cardiovascular;  Laterality: N/A;  . Central venous catheter tunneled insertion single lumen  09/16/2012    right IJ  . Cardioversion N/A 11/08/2012    Procedure: CARDIOVERSION;  Surgeon: Jolaine Artist, MD;  Location: [facility] ENDOSCOPY;  Service: Cardiovascular;  Laterality: N/A;  . Back surgery  1987, lower    Ruptured disk repair   Social History:  reports that he has never smoked. He has never used smokeless tobacco. He reports that he uses illicit drugs ("Crack" cocaine and Cocaine). He reports that he does not drink alcohol.  Allergies  Allergen Reactions  . Nitroglycerin Other (See Comments)     "feels like head is going to bust open"    Family History  Problem Relation Age of Onset  . Heart disease    . Heart failure    . Stroke    . Anesthesia problems Neg Hx   . Hypotension Neg Hx   . Malignant hyperthermia Neg Hx   . Pseudochol deficiency Neg Hx      Prior to Admission medications   Medication Sig Start Date End Date Taking? Authorizing Provider  allopurinol (ZYLOPRIM) 100 MG tablet Take 1 tablet (100 mg total) by mouth daily. 02/05/12  Yes Wynetta Emery, PA-C  amiodarone (PACERONE) 200 MG tablet Take 200 mg by mouth 2 (two) times daily.    Yes Historical Provider, MD  apixaban (ELIQUIS) 5 MG TABS tablet Take 1 tablet (5 mg total) by mouth 2 (two) times daily. 10/27/13  Yes Larey Dresser, MD  carvedilol (COREG) 6.25 MG tablet Take 6.25 mg by mouth 2 (two) times daily with a meal.    Yes Historical Provider, MD  colchicine 0.6 MG tablet Take 1 tablet (0.6 mg total) by mouth daily. 11/28/13  Yes Quintella Reichert, MD  eplerenone (INSPRA) 25 MG tablet Take 25 mg by mouth daily. 09/11/13  Yes Rande Brunt, NP  esomeprazole (NEXIUM) 40 MG capsule Take 40 mg by mouth daily at 12 noon.   Yes Historical Provider, MD  fluticasone (FLONASE) 50 MCG/ACT nasal spray Place 2 sprays into both nostrils daily as needed for allergies. 10/17/13  Yes Debbrah Alar, NP  hydrOXYzine (ATARAX/VISTARIL) 25 MG tablet Take 1 tablet (25 mg total) by mouth 2 (two) times daily as needed. 09/15/13  Yes Jolaine Artist, MD  LORazepam (ATIVAN) 0.5 MG tablet Take 1-2 tablets (0.5-1 mg total) by mouth 2 (two) times daily as needed for anxiety. 10/17/13  Yes Debbrah Alar, NP  Magnesium Oxide 400 (240 MG) MG TABS Take 1 tablet by mouth 2 (two) times daily. 09/11/13  Yes Rande Brunt, NP  metolazone (ZAROXOLYN) 2.5 MG tablet Take 1 tablet (2.5 mg total) by mouth daily as needed (for fluid retention). 08/27/13  Yes Shaune Pascal Bensimhon, MD  milrinone [facility]) 20 MG/100ML SOLN infusion Inject 0.375  mcg/kg/min into the vein continuous. 440mg  in 425ml infusion 2.5 ml per hour   Yes Historical Provider, MD  Oxycodone HCl 20 MG TABS Take 1 tablet by mouth 3 (three) times daily as needed (pain.).   Yes Historical Provider, MD  potassium chloride SA (K-DUR,KLOR-CON) 20 MEQ tablet Take 4 tabs in AM and 4 tabs in PM 09/11/13  Yes Rande Brunt, NP  senna-docusate (SENOKOT-S) 8.6-50 MG per tablet Take 1-2 tablets by mouth 2 (two) times daily as needed for mild constipation or moderate constipation. 10/21/13  Yes Debbrah Alar, NP  sucralfate (CARAFATE) 1 GM/10ML suspension Take 10 mLs (1 g total) by mouth 4 (four) times daily. 09/09/13  Yes Jessica D. Zehr, PA-C  torsemide (DEMADEX) 20 MG tablet Take 20 mg by mouth See admin instructions. Takes three tablet in the morning (60mg ) and two tablet in the evening (40mg )   Yes  Historical Provider, MD  zolpidem (AMBIEN) 5 MG tablet Take 1 tablet (5 mg total) by mouth at bedtime as needed for sleep. 11/26/13  Yes Jolaine Artist, MD  NEXIUM 40 MG capsule TAKE ONE CAPSULE BY MOUTH TWICE A DAY BEFORE A MEAL 11/13/13   Debbrah Alar, NP  promethazine (PHENERGAN) 25 MG tablet Take 25 mg by mouth every 6 (six) hours as needed for nausea or vomiting.    Historical Provider, MD   Physical Exam: Filed Vitals:   12/10/13 0415  BP: 99/58  Pulse: 41  Temp:   Resp: 19    BP 99/58 mmHg  Pulse 41  Temp(Src) 97.6 F (36.4 C)  Resp 19  SpO2 98%  General Appearance:    Alert, oriented, no distress, appears stated age  Head:    Normocephalic, atraumatic  Eyes:    PERRL, EOMI, sclera non-icteric        Nose:   Nares without drainage or epistaxis. Mucosa, turbinates normal  Throat:   Moist mucous membranes. Oropharynx without erythema or exudate.  Neck:   Supple. No carotid bruits.  No thyromegaly.  No lymphadenopathy.   Back:     No CVA tenderness, no spinal tenderness  Lungs:     Clear to auscultation bilaterally, without wheezes, rhonchi or rales   Chest wall:    No tenderness to palpitation  Heart:    Regular rate and rhythm without murmurs, gallops, rubs  Abdomen:     Soft, non-tender, nondistended, normal bowel sounds, no organomegaly  Genitalia:    deferred  Rectal:    deferred  Extremities:   No clubbing, cyanosis or edema.  Pulses:   2+ and symmetric all extremities  Skin:   Skin color, texture, turgor normal, no rashes or lesions  Lymph nodes:   Cervical, supraclavicular, and axillary nodes normal  Neurologic:   CNII-XII intact. Normal strength, sensation and reflexes      throughout    Labs on Admission:  Basic Metabolic Panel:  Recent Labs Lab 12/09/13 2250 12/10/13 0406  NA 140 141  K 3.9 4.0  CL 103 103  CO2 18* 22  GLUCOSE 170* 120*  BUN 49* 47*  CREATININE 2.20* 2.14*  CALCIUM 9.8 9.9   Liver Function Tests:  Recent Labs Lab 12/09/13 2250  AST 48*  ALT 25  ALKPHOS 87  BILITOT 0.4  PROT 8.4*  ALBUMIN 3.4*   No results for input(s): LIPASE, AMYLASE in the last 168 hours. No results for input(s): AMMONIA in the last 168 hours. CBC:  Recent Labs Lab 12/09/13 2250  WBC 5.7  HGB 14.0  HCT 40.5  MCV 85.8  PLT 231   Cardiac Enzymes: No results for input(s): CKTOTAL, CKMB, CKMBINDEX, TROPONINI in the last 168 hours.  BNP (last 3 results)  Recent Labs  06/11/13 1254 08/06/13 1245 12/09/13 2250  PROBNP 542.4* 3225.0* 665.8*   CBG: No results for input(s): GLUCAP in the last 168 hours.  Radiological Exams on Admission: Ct Head Wo Contrast  12/10/2013   CLINICAL DATA:  Headache and dizziness beginning last week, no trauma or loss of consciousness.  EXAM: CT HEAD WITHOUT CONTRAST  TECHNIQUE: Contiguous axial images were obtained from the base of the skull through the vertex without intravenous contrast.  COMPARISON:  CT of the head October 07, 2011  FINDINGS: The ventricles and sulci are normal for age. No intraparenchymal hemorrhage, mass effect nor midline shift. Patchy supratentorial  white matter hypodensities are within normal range for patient's age  and though non-specific suggest sequelae of chronic small vessel ischemic disease. Mild basal ganglia mineralization. No acute large vascular territory infarcts. Re- demonstration of LEFT transcortical occipital lobe encephalomalacia.  No abnormal extra-axial fluid collections. Basal cisterns are patent. Moderate calcific atherosclerosis of the carotid siphons.  No skull fracture. The included ocular globes and orbital contents are non-suspicious. Trace paranasal sinus mucosal thickening without air-fluid levels. The mastoid air cells appear well-aerated. Pneumatized petrous apices.  IMPRESSION: No acute intracranial process.  Mild white matter changes suggest chronic small vessel ischemic disease. LEFT occipital encephalomalacia may be posttraumatic or, are represent remote LEFT posterior cerebral artery territory infarct.   Electronically Signed   By: Elon Alas   On: 12/10/2013 03:05   Dg Chest Port 1 View  12/09/2013   CLINICAL DATA:  Mid and left-sided chest pain. Pain down the left arm. Shortness of breath.  EXAM: PORTABLE CHEST - 1 VIEW  COMPARISON:  08/06/2013  FINDINGS: Cardiac pacemaker. Right central venous catheter with tip over the low SVC region. No pneumothorax. Shallow inspiration. Heart size and pulmonary vascularity are normal. Lungs appear clear and expanded. Postoperative changes in the cervical spine.  IMPRESSION: No active disease.   Electronically Signed   By: Lucienne Capers M.D.   On: 12/09/2013 23:17    EKG: Independently reviewed.  Assessment/Plan Principal Problem:   Systolic CHF, acute on chronic Active Problems:   Cardiomyopathy, dilated, nonischemic   Atrial arrhythmia   CKD (chronic kidney disease) stage 4, GFR 15-29 ml/min   Vertigo   1. Acute on chronic Systolic CHF - 1. Cardiology has seen patient in consult 2. Lasix 80 mg IV given in ED 3. Continuing other home meds including  milrinone per pharm consult 4. On CHF pathway 2. Atrial arrhythmias - 1. Chronic 2. Amiodarone 200mg  BID 3. ICD interrogation 4. Continue eliquis 3. Vertigo - 1. Neurology evaluating, but sounds more peripheral vertigo 2. Not candidate for MRI due to AICD in place 3. Does have encephalomalacia that has been present since 2013 in his left posterior cerebral artery territory. 4. On eliquis for atrial arrhythmias and low EF. 5. Will defer further testing recommendations, if any, to neurology. 4. Chronic pain - continue home meds 5. CKD stage 4 - creatinine is stable and actually perhaps slightly better than his baseline on todays evening labs.    Code Status: Full Code  Family Communication: No family in room Disposition Plan: Admit to obs   Time spent: 70 min  GARDNER, JARED M. [facility] Pager [phone]  If 7AM-7PM, please contact the day team taking care of the patient [email or web address] Password [email or web address] 12/10/2013, 5:13 AM

## 2013-12-10 NOTE — Consult Note (Addendum)
Admit date: 12/09/2013 Referring Physician  Dr. Roxanne Mins Primary Cardiologist  Dr. Haroldine Laws Reason for Consultation  SOB, sharp left arm pain, vertigo and chest pressure  HPI:Mr. Narine is a 63 y.o. gentlemen with severe HF due to NICM (EF 20-25%) with multiple hospital admissions for HF exacerbations. He also has history of ventricular tachycardia s/p St Jude ICD by Dr. Lovena Le, CKD: stage IV, and paroxysmal atrial arrhythmias on amiodarone as well as sarcoidosis and hypertension. Cath in 2008 by Dr. Terrence Dupont showed no CAD.Seen at [facility] and not felt to be a transplant candidate due to renal failure and lack of family support.  Milrinone initiated in January 2014 for low output. 03/01/12 milrinone increased 0.321mc/kg/min.   ECHO 03/22/12 EF 15%  Echo 6/15: 20-25%  04/26/12 S/P Successful DC-CV for atrial fibrillation. Remains on amiodarone.   RHC 04/30/13 RA = 4  RV = 20/1/3  PA = 23/8 (15)  PCW = 5  Fick cardiac output/index =4.5/1.9  Thermo CO/CI = 3.9/1.6  PVR =  FA sat = 98%  PA sat = 62%, 64% - On milrinone 0.25  Follow-up: Had tunneling catheter replaced 10/27  Weight at home has been 243-244 pounds although it increased the other night and he took a metalazone and his weight is back down to 244 today. Taking all medications. [facility] following for home milrinone. He has been having dizziness for the past 2 weeks that he describes as vertigo. It is reproducible with head movements.  He says that he has had problems with balance due to the vertigo and has fallen several times.  This am he was real dizzy and had some nausea and vomiting with it.  He started having sharp pain going down his neck and left arm and then started feeling his heart beat fast and says that he was in afib for a short period of time.  He then developed severe SOB and came to [facility] ER. Per the nurse in triage, he was very SOB gasping for air and initial O2 sats were 91% on RA.  He denies any chills but says that he  has had some hot flashes with diffuse itching.  He denies any cough or LE edema.  He has had multiple admissions for similar symptoms except the vertigo is new.  On Labs his BNP appears slightly up from prior but his chest xray is normal.  He has no LE edema on exam.  He appears to have a metabolic acidosis on BMET.  EKG showed sinus tachycardia initially and now NSR with bigeminal PVC's.  Of note he says that his AICD fired last night.  He is not on an ace inhibitor due to elevated creatinine. Not on nitrates due to Viagra use.       PMH:   Past Medical History  Diagnosis Date  . CHF (congestive heart failure)   . Sarcoidosis   . Cardiomyopathy, dilated, nonischemic     non ischemic by cath  . Acute on chronic systolic heart failure   . Automatic implantable cardiac defibrillator in situ   . Atrial fibrillation   . NSVT (nonsustained ventricular tachycardia)   . GERD (gastroesophageal reflux disease)   . Hypercholesteremia   . Shortness of breath   . Chronic kidney disease (CKD), stage III (moderate)   . Pacemaker   . Anginal pain   . Gout   . Hypertension     dr [name]  . Coronary artery disease   . Elevated PSA 06/24/2013  .  Myocardial infarction      PSH:   Past Surgical History  Procedure Laterality Date  . Pacemaker insertion  2009    with ICD  . Tee without cardioversion  01/17/2011    Procedure: TRANSESOPHAGEAL ECHOCARDIOGRAM (TEE);  Surgeon: Birdie Riddle, MD;  Location: [place];  Service: Cardiovascular;  Laterality: N/A;  . Cardioversion  01/17/2011    Procedure: CARDIOVERSION;  Surgeon: Birdie Riddle, MD;  Location: [place];  Service: Cardiovascular;  Laterality: N/A;  . Cardiac catheterization    . Insert / replace / remove pacemaker    . Anterior cervical decomp/discectomy fusion  08/21/2011    Procedure: ANTERIOR CERVICAL DECOMPRESSION/DISCECTOMY FUSION 2 LEVELS;  Surgeon: Marybelle Killings, MD;  Location: [place];  Service: Orthopedics;  Laterality: N/A;   C5-6, C6-7 Anterior Cervical Discectomy and Fusion, allograft, plate  . Tee without cardioversion N/A 02/16/2012    Procedure: TRANSESOPHAGEAL ECHOCARDIOGRAM (TEE);  Surgeon: Jolaine Artist, MD;  Location: [facility] ENDOSCOPY;  Service: Cardiovascular;  Laterality: N/A;  original case scheduled under his dad (who is deceased), rescheduled under correct mrn/pt/dob. [place]/dl  . Tee without cardioversion N/A 03/22/2012    Procedure: TRANSESOPHAGEAL ECHOCARDIOGRAM (TEE);  Surgeon: Lelon Perla, MD;  Location: [place];  Service: Cardiovascular;  Laterality: N/A;  . Cardioversion N/A 03/22/2012    Procedure: CARDIOVERSION;  Surgeon: Lelon Perla, MD;  Location[facility] ENDOSCOPY;  Service: Cardiovascular;  Laterality: N/A;  . Cardioversion N/A 04/26/2012    Procedure: CARDIOVERSION;  Surgeon: Jolaine Artist, MD;  Location: [facility] ENDOSCOPY;  Service: Cardiovascular;  Laterality: N/A;  . Central venous catheter tunneled insertion single lumen  09/16/2012    right IJ  . Cardioversion N/A 11/08/2012    Procedure: CARDIOVERSION;  Surgeon: Jolaine Artist, MD;  Location: [facility] ENDOSCOPY;  Service: Cardiovascular;  Laterality: N/A;  . Back surgery  1987, lower    Ruptured disk repair    Allergies:  Nitroglycerin Prior to Admit Meds:   (Not in a hospital admission) Fam HX:    Family History  Problem Relation Age of Onset  . Heart disease    . Heart failure    . Stroke    . Anesthesia problems Neg Hx   . Hypotension Neg Hx   . Malignant hyperthermia Neg Hx   . Pseudochol deficiency Neg Hx    Social HX:    History   Social History  . Marital Status: Single    Spouse Name: N/A    Number of Children: N/A  . Years of Education: N/A   Occupational History  . Not on file.   Social History Main Topics  . Smoking status: Never Smoker   . Smokeless tobacco: Never Used  . Alcohol Use: No  . Drug Use: Yes    Special: "Crack" cocaine, Cocaine     Comment: last use august 2004  . Sexual Activity:  Not Currently   Other Topics Concern  . Not on file   Social History Narrative   3 sons- one in [place], 1 in [place], on in [place]   Retired from post office- on disability   Completed Bachelors from [employer]     ROS:  All 11 ROS were addressed and are negative except what is stated in the HPI  Physical Exam: Blood pressure 110/79, pulse 101, temperature 97.6 F (36.4 C), resp. rate 20, SpO2 100 %.    General: Well developed, well nourished, in no acute distress Head: Eyes PERRLA, No xanthomas.  Normal cephalic and atramatic  Lungs:   Clear bilaterally to auscultation and percussion. Heart:   HRRR S1 S2 Pulses are 2+ & equal.            No carotid bruit. No JVD.  No abdominal bruits. No femoral bruits. Abdomen: Bowel sounds are positive, abdomen soft and non-tender without masses  Extremities:   No clubbing, cyanosis or edema.  DP +1 Neuro: Alert and oriented X 3. Psych:  Good affect, responds appropriately    Labs:   Lab Results  Component Value Date   WBC 5.7 12/09/2013   HGB 14.0 12/09/2013   HCT 40.5 12/09/2013   MCV 85.8 12/09/2013   PLT 231 12/09/2013    Recent Labs Lab 12/09/13 2250  NA 140  K 3.9  CL 103  CO2 18*  BUN 49*  CREATININE 2.20*  CALCIUM 9.8  GLUCOSE 170*   No results found for: PTT Lab Results  Component Value Date   INR 1.07 11/27/2013   INR 1.09 06/09/2013   INR 1.04 04/29/2013   Lab Results  Component Value Date   CKTOTAL 50 10/10/2011   CKMB 2.6 10/10/2011   TROPONINI <0.30 11/17/2012     Lab Results  Component Value Date   CHOL 171 06/23/2013   CHOL 144 10/09/2011   CHOL 193 08/19/2011   Lab Results  Component Value Date   HDL 63 06/23/2013   HDL 70 10/09/2011   HDL 87 08/19/2011   Lab Results  Component Value Date   LDLCALC 70 06/23/2013   LDLCALC 55 10/09/2011   LDLCALC 74 08/19/2011   Lab Results  Component Value Date   TRIG 188* 06/23/2013   TRIG 96 10/09/2011   TRIG 161* 08/19/2011   Lab Results  Component  Value Date   CHOLHDL 2.7 06/23/2013   CHOLHDL 2.1 10/09/2011   CHOLHDL 2.2 08/19/2011   No results found for: LDLDIRECT    Radiology:  Dg Chest Port 1 View  12/09/2013   CLINICAL DATA:  Mid and left-sided chest pain. Pain down the left arm. Shortness of breath.  EXAM: PORTABLE CHEST - 1 VIEW  COMPARISON:  08/06/2013  FINDINGS: Cardiac pacemaker. Right central venous catheter with tip over the low SVC region. No pneumothorax. Shallow inspiration. Heart size and pulmonary vascularity are normal. Lungs appear clear and expanded. Postoperative changes in the cervical spine.  IMPRESSION: No active disease.   Electronically Signed   By: Lucienne Capers M.D.   On: 12/09/2013 23:17    EKG:  NSR with bigeminal PVC's and LAE  ASSESSMENT & PLAN:   1) Acute on chronic systolic HF: NICM, EF 58-85% (06/2013). St Jude ICD. He has been on home milrinone 0.375 mcg/kg/min over 1 year. He is more fatigued and short of breath with exertion though he does not look particularly volume overloaded. His chest xray is normal and he has no rales in his lungs or LE edema.  His weight did go up to 250 but he took a metalazone and his weight was back to 244lbs today which is at his baseline.  His BNP is mildly elevated.  He may have had a brief episode of PAF which triggered the SOB and mildly elevated BNP.   - Continue current milrinone but will check co-ox.  - Continue low dose coreg 3.125 mg BID - Continue Inspra 12.5 mg daily  - Continue torsemide 60/40 with metolazone once a week.  - No ACEI with CKD - Will give one dose of Lasix  80mg  IV now - check BMET in am  2) Atrial arrhythmias: Paroxysmal. Now in NSR (sinus tachycardia earlier on EKG today) on amiodarone with bigeminal PVC's.  He thinks he was in PAF earlier today and also says his ICD fired last PM.  This may have been what led to the acute episode os severe SOB.   - Will leave amio at 200 bid for now  - continue Eliquis.  - ICD interrogation in the  am to determine if he received a shock  3) CKD, stage HK:FEXMDY creatinine  4) Chronic pain: Per pain clinic.   5) Vertigo of unclear etiology.  He gives a history of dizziness with his CHF episodes in the past but he says that this is different and he has been very off balance and fell last week.  Workup per hospitalist.  Sueanne Margarita, MD  12/10/2013  1:16 AM

## 2013-12-10 NOTE — Progress Notes (Signed)
[facility]  Patient Status: Active pt with [facility] prior to this readmission  [facility] is providing the following services: HHRN and [facility] for home inotropes.  [facility] hospital team will follow Mr. Annie during his hospital stay and support DC home when deemed appropriate by HF Team.  If patient discharges after hours, please call [phone].   Larry Sierras 12/10/2013, 11:11 AM

## 2013-12-10 NOTE — Progress Notes (Signed)
UR completed 

## 2013-12-11 ENCOUNTER — Telehealth: Payer: Self-pay | Admitting: Family

## 2013-12-11 ENCOUNTER — Encounter (HOSPITAL_COMMUNITY): Payer: Self-pay | Admitting: Internal Medicine

## 2013-12-11 ENCOUNTER — Inpatient Hospital Stay (HOSPITAL_COMMUNITY): Payer: Medicare Other

## 2013-12-11 ENCOUNTER — Encounter: Payer: Self-pay | Admitting: Internal Medicine

## 2013-12-11 DIAGNOSIS — R42 Dizziness and giddiness: Secondary | ICD-10-CM | POA: Insufficient documentation

## 2013-12-11 DIAGNOSIS — I5022 Chronic systolic (congestive) heart failure: Secondary | ICD-10-CM

## 2013-12-11 LAB — BASIC METABOLIC PANEL
Anion gap: 12 (ref 5–15)
BUN: 48 mg/dL — ABNORMAL HIGH (ref 6–23)
CALCIUM: 9.7 mg/dL (ref 8.4–10.5)
CO2: 25 mEq/L (ref 19–32)
Chloride: 99 mEq/L (ref 96–112)
Creatinine, Ser: 2.24 mg/dL — ABNORMAL HIGH (ref 0.50–1.35)
GFR calc Af Amer: 34 mL/min — ABNORMAL LOW (ref >90)
GFR, EST NON AFRICAN AMERICAN: 29 mL/min — AB (ref >90)
GLUCOSE: 127 mg/dL — AB (ref 70–99)
Potassium: 4.1 mEq/L (ref 3.7–5.3)
Sodium: 136 mEq/L — ABNORMAL LOW (ref 137–147)

## 2013-12-11 MED ORDER — ZOLPIDEM TARTRATE 5 MG PO TABS: 5.0000 mg | ORAL_TABLET | Freq: Every evening | ORAL | Status: DC | PRN

## 2013-12-11 NOTE — Progress Notes (Signed)
Cardiac monitor discontinued, CCMD notified. Pam with [facility] in room setting up patient's home Milrinone.

## 2013-12-11 NOTE — Progress Notes (Signed)
Home discharge instructions discussed with patient. Discussed diet, activity, follow up appointment and discharge medications with patient. Pam, from [facility] to come and hook up patient to home Milrinone IV via PICC line. Prescriptions for Ativan, Oxycodone and Ambien given to patient. Heart failure S/S also discussed with patient. Patient able to use teachback to verbalize understanding.

## 2013-12-11 NOTE — Progress Notes (Signed)
Patient ambulatory out of hospital for discharge. [facility] to see patient tomorrow at home.

## 2013-12-11 NOTE — Progress Notes (Signed)
Advanced Heart Failure Rounding Note   Subjective:    Admitted yesterday with possible ICD shock.  Device interrogation did not reveal ICD shock.   Denies SOB.      Objective:   Weight Range:  Vital Signs:   Temp:  [97.3 F (36.3 C)-98.3 F (36.8 C)] 97.8 F (36.6 C) (12/10 0953) Pulse Rate:  [72-91] 81 (12/10 0953) Resp:  [16-19] 16 (12/10 0953) BP: (96-117)/(55-83) 96/55 mmHg (12/10 0953) SpO2:  [96 %-100 %] 100 % (12/10 0953) Weight:  [246 lb 7.6 oz (111.8 kg)] 246 lb 7.6 oz (111.8 kg) (12/10 0537) Last BM Date: 12/10/13  Weight change: Filed Weights   12/10/13 0605 12/11/13 0537  Weight: 243 lb (110.224 kg) 246 lb 7.6 oz (111.8 kg)    Intake/Output:   Intake/Output Summary (Last 24 hours) at 12/11/13 0958 Last data filed at 12/11/13 0530  Gross per 24 hour  Intake 1070.09 ml  Output   2080 ml  Net -1009.91 ml     Physical Exam: General:  Chronically ill appearing. No resp difficulty HEENT: normal Neck: supple. JVP 7-8  . Carotids 2+ bilat; no bruits. No lymphadenopathy or thryomegaly appreciated. Cor: PMI nondisplaced. Regular rate & rhythm. No rubs, gallops or murmurs. R upper chest hickman Lungs: clear Abdomen: obese, soft, nontender, nondistended. No hepatosplenomegaly. No bruits or masses. Good bowel sounds. Extremities: no cyanosis, clubbing, rash, edema Neuro: alert & orientedx3, cranial nerves grossly intact. moves all 4 extremities w/o difficulty. Affect pleasant  Telemetry: NSR 80s  Labs: Basic Metabolic Panel:  Recent Labs Lab 12/09/13 2250 12/10/13 0406 12/11/13 0525  NA 140 141 136*  K 3.9 4.0 4.1  CL 103 103 99  CO2 18* 22 25  GLUCOSE 170* 120* 127*  BUN 49* 47* 48*  CREATININE 2.20* 2.14* 2.24*  CALCIUM 9.8 9.9 9.7    Liver Function Tests:  Recent Labs Lab 12/09/13 2250  AST 48*  ALT 25  ALKPHOS 87  BILITOT 0.4  PROT 8.4*  ALBUMIN 3.4*   No results for input(s): LIPASE, AMYLASE in the last 168 hours. No results for  input(s): AMMONIA in the last 168 hours.  CBC:  Recent Labs Lab 12/09/13 2250  WBC 5.7  HGB 14.0  HCT 40.5  MCV 85.8  PLT 231    Cardiac Enzymes: No results for input(s): CKTOTAL, CKMB, CKMBINDEX, TROPONINI in the last 168 hours.  BNP: BNP (last 3 results)  Recent Labs  06/11/13 1254 08/06/13 1245 12/09/13 2250  PROBNP 542.4* 3225.0* 665.8*     Other results:  EKG:   Imaging: Dg Chest 2 View  12/10/2013   CLINICAL DATA:  Cough and shortness of breath.  EXAM: CHEST  2 VIEW  COMPARISON:  Chest x-rays dated 12/09/2013 and 08/06/2013  FINDINGS: AICD in place. PICC line in place with the tip just below the level of the carina in good position.  Heart size and pulmonary vascularity are normal. Lungs are clear except for a tiny area of linear scarring at the right lung base laterally.  No acute osseous abnormality.  IMPRESSION: No active cardiopulmonary disease.   Electronically Signed   By: Rozetta Nunnery M.D.   On: 12/10/2013 21:12   Ct Head Wo Contrast  12/10/2013   CLINICAL DATA:  Headache and dizziness beginning last week, no trauma or loss of consciousness.  EXAM: CT HEAD WITHOUT CONTRAST  TECHNIQUE: Contiguous axial images were obtained from the base of the skull through the vertex without intravenous contrast.  COMPARISON:  CT  of the head October 07, 2011  FINDINGS: The ventricles and sulci are normal for age. No intraparenchymal hemorrhage, mass effect nor midline shift. Patchy supratentorial white matter hypodensities are within normal range for patient's age and though non-specific suggest sequelae of chronic small vessel ischemic disease. Mild basal ganglia mineralization. No acute large vascular territory infarcts. Re- demonstration of LEFT transcortical occipital lobe encephalomalacia.  No abnormal extra-axial fluid collections. Basal cisterns are patent. Moderate calcific atherosclerosis of the carotid siphons.  No skull fracture. The included ocular globes and orbital  contents are non-suspicious. Trace paranasal sinus mucosal thickening without air-fluid levels. The mastoid air cells appear well-aerated. Pneumatized petrous apices.  IMPRESSION: No acute intracranial process.  Mild white matter changes suggest chronic small vessel ischemic disease. LEFT occipital encephalomalacia may be posttraumatic or, are represent remote LEFT posterior cerebral artery territory infarct.   Electronically Signed   By: Elon Alas   On: 12/10/2013 03:05   Dg Chest Port 1 View  12/09/2013   CLINICAL DATA:  Mid and left-sided chest pain. Pain down the left arm. Shortness of breath.  EXAM: PORTABLE CHEST - 1 VIEW  COMPARISON:  08/06/2013  FINDINGS: Cardiac pacemaker. Right central venous catheter with tip over the low SVC region. No pneumothorax. Shallow inspiration. Heart size and pulmonary vascularity are normal. Lungs appear clear and expanded. Postoperative changes in the cervical spine.  IMPRESSION: No active disease.   Electronically Signed   By: Lucienne Capers M.D.   On: 12/09/2013 23:17      Medications:     Scheduled Medications: . allopurinol  100 mg Oral Daily  . amiodarone  200 mg Oral BID  . apixaban  5 mg Oral BID  . carvedilol  6.25 mg Oral BID WC  . colchicine  0.6 mg Oral Daily  . magnesium oxide  400 mg Oral BID  . pantoprazole  80 mg Oral Q1200  . potassium chloride SA  80 mEq Oral BID  . sodium chloride  3 mL Intravenous Q12H  . spironolactone  12.5 mg Oral Daily  . sucralfate  1 g Oral TID WC & HS  . torsemide  40 mg Oral QPM  . torsemide  60 mg Oral Q breakfast     Infusions: . milrinone 0.375 mcg/kg/min (12/11/13 0448)     PRN Medications:  sodium chloride, acetaminophen, benzonatate, fluticasone, hydrOXYzine, LORazepam, ondansetron (ZOFRAN) IV, oxyCODONE, promethazine, senna-docusate, sodium chloride, sodium chloride, zolpidem   Assessment:  1. Possible ICD shock 2. Chronic Systolic Heart Failure on Milrinone  3. Atrial  Arrhythmias 4. CKD Stage IV 5. Chronic Pain    Plan/Discussion:    Volume status ok despite weight gain. Continue current diuretic regimen, milrionone 0.375 mcg, carvedilol 6.265 mg twice a day, Inspra 12.5 mg daily. No ace due to ckd.   Maintaining SR. Continue amiodarone 200 mg twice a day and apixaban 2.5 mg twice a day.   D/C HF meds Inspra 12.5 mg daily ( not spiro due to breast tenderness) Carvedilol 6.25 mg twice a day Torsemide 60 mg in am and 40 mg in pm Amiodarone 200 mg twice a day --> continue to due to recurrence of A fib Apixaban 2.5 mg twice a day Milrinone 0.375 mcg --> already set up with [facility]  Will set follow up in HF clinic.     Length of Stay: 2   CLEGG,AMY 12/11/2013, 9:58 AM  Advanced Heart Failure Team Pager [phone] (M-F; 7a - 4p)  Please contact [facility] for night-coverage after  hours (4p -7a ) and weekends on [email or web address]  Patient seen and examined with Darrick Grinder, NP. We discussed all aspects of the encounter. I agree with the assessment and plan as stated above.   Agree he is ok for d/c today. ICD interrogation yesterday without VT or ICD shock.   Will f/u in HF Clinic.   Damon Baisch,MD 4:02 PM

## 2013-12-11 NOTE — Telephone Encounter (Signed)
Please arrange hospital follow up.  

## 2013-12-11 NOTE — Discharge Summary (Signed)
Physician Discharge Summary  [name] OEU:[id number] DOB: 06-16-1950 DOA: 12/09/2013  PCP: Nance Pear., NP  Admit date: 12/09/2013 Discharge date: 12/11/2013  Time spent: 35 minutes  Recommendations for Outpatient Follow-up:  1. Please follow up on volume status, he was admitted for acute CHF 2. Follow up on BMP in 1 week  Discharge Diagnoses:  Principal Problem:   Systolic CHF, acute on chronic Active Problems:   Cardiomyopathy, dilated, nonischemic   Atrial arrhythmia   CKD (chronic kidney disease) stage 4, GFR 15-29 ml/min   Vertigo   Acute on chronic systolic CHF (congestive heart failure), NYHA class 3   Dizziness   Discharge Condition: Stable/Improved  Diet recommendation: Heart Healthy  Filed Weights   12/10/13 0605 12/11/13 0537  Weight: 110.224 kg (243 lb) 111.8 kg (246 lb 7.6 oz)    History of present illness:  [name] is a 63 y.o. male with h/o NICM EF 20-25%, s/p AICD placement, milrinone gtt at baseline, numerous hospital admissions for CHF. Seen at [facility], not a transplant candidate due to renal failure and lack of family support. Patient presents to ED with c/o vertigo, chest pressure and SOB. He states he developed dizziness over the past 2 days, spinning sensation that is worse with change in body position. Last evening his defibrillator fired he says and the dizziness has been constant since then. Additionally his chronic chest pain, has been worse following the defibrillator firing. Recently his weight had gone up 4 lbs and so he took a dose of metolazone. SOB isnt really any different from baseline he states.  Hospital Course:  Patient is a pleasant 63 year old with a past medical history of nonischemic cardiomyopathy having an ejection fraction of 20-25% based on transthoracic echocardiogram from 06/11/2013, status post AICD placement, requiring milrinone drip at baseline, admitted to the medicine service on 12/10/2013  presenting with complaints of shortness of breath. Symptoms likely reflecting congestive heart failure exacerbation. Patient was administered 80 mg of IV Lasix 1 in the emergency department, then continued on Demadex and Aldactone. He was seen and evaluated by the heart failure team during this hospitalization.   Acute on chronic systolic congestive heart failure. -Patient with history of systolic congestive heart failure having ejection fraction of 20-25%, requiring milrinone drip at baseline, presenting with complaints of shortness of breath -His pacemaker was interrogated, did not show recent shocks -Will discharge on Demadex 40 mg by mouth every afternoon and 60 mg by mouth every morning, eplerenone 25 mg PO q daily and milrinone drip.  -He will follow up at the heart failure clinic.  2. Atrial fibrillation -Rhythm controlled. Continue amiodarone 200 mg by mouth twice a day -Continue anticoagulation with Eliquis 5 mg by mouth twice a day  3. Vertigo. -Patient reporting dizziness that tends to be positional -Seems consistent with benign positional vertigo -Improved by day of discharge  4. Stage IV chronic kidney disease. -Kidney function stable, labs showing creatinine of 2.4 on day of discharge  5. Hypertension -Blood pressure stable, continue Coreg 6.25 mg by mouth twice a day   Consultations:  Heart Failure Team  Discharge Exam: Filed Vitals:   12/11/13 0953  BP: 96/55  Pulse: 81  Temp: 97.8 F (36.6 C)  Resp: 16    General: Patient is no acute distress, awake alert oriented  Cardiovascular: Regular rate and rhythm normal S1-S2  Respiratory: Bibasilar crackles, no wheezing rhonchi or rales, normal respiratory effort  Abdomen: Soft nontender nondistended  Musculoskeletal: Trace edema to lower  extremities bilaterally  Discharge Instructions You were cared for by a hospitalist during your hospital stay. If you have any questions about your discharge  medications or the care you received while you were in the hospital after you are discharged, you can call the unit and asked to speak with the hospitalist on call if the hospitalist that took care of you is not available. Once you are discharged, your primary care physician will handle any further medical issues. Please note that NO REFILLS for any discharge medications will be authorized once you are discharged, as it is imperative that you return to your primary care physician (or establish a relationship with a primary care physician if you do not have one) for your aftercare needs so that they can reassess your need for medications and monitor your lab values.  Discharge Instructions    (HEART FAILURE PATIENTS) Call MD:  Anytime you have any of the following symptoms: 1) 3 pound weight gain in 24 hours or 5 pounds in 1 week 2) shortness of breath, with or without a dry hacking cough 3) swelling in the hands, feet or stomach 4) if you have to sleep on extra pillows at night in order to breathe.    Complete by:  As directed      Call MD for:  difficulty breathing, headache or visual disturbances    Complete by:  As directed      Call MD for:  extreme fatigue    Complete by:  As directed      Call MD for:  hives    Complete by:  As directed      Call MD for:  persistant dizziness or light-headedness    Complete by:  As directed      Call MD for:  persistant nausea and vomiting    Complete by:  As directed      Call MD for:  redness, tenderness, or signs of infection (pain, swelling, redness, odor or green/yellow discharge around incision site)    Complete by:  As directed      Call MD for:  severe uncontrolled pain    Complete by:  As directed      Call MD for:  temperature >100.4    Complete by:  As directed      Diet - low sodium heart healthy    Complete by:  As directed      Increase activity slowly    Complete by:  As directed           Current Discharge Medication List     CONTINUE these medications which have CHANGED   Details  LORazepam (ATIVAN) 0.5 MG tablet Take 1 tablet (0.5 mg total) by mouth every 8 (eight) hours as needed for anxiety. Qty: 15 tablet, Refills: 0    Oxycodone HCl 20 MG TABS Take 1 tablet (20 mg total) by mouth 3 (three) times daily as needed (pain.). Qty: 15 tablet, Refills: 0      CONTINUE these medications which have NOT CHANGED   Details  allopurinol (ZYLOPRIM) 100 MG tablet Take 1 tablet (100 mg total) by mouth daily. Qty: 30 tablet, Refills: 6    amiodarone (PACERONE) 200 MG tablet Take 200 mg by mouth 2 (two) times daily.     apixaban (ELIQUIS) 5 MG TABS tablet Take 1 tablet (5 mg total) by mouth 2 (two) times daily. Qty: 60 tablet, Refills: 4   Associated Diagnoses: Atrial flutter, unspecified    carvedilol (COREG) 6.25  MG tablet Take 6.25 mg by mouth 2 (two) times daily with a meal.     colchicine 0.6 MG tablet Take 1 tablet (0.6 mg total) by mouth daily. Qty: 4 tablet, Refills: 0    eplerenone (INSPRA) 25 MG tablet Take 25 mg by mouth daily.    esomeprazole (NEXIUM) 40 MG capsule Take 40 mg by mouth daily at 12 noon.    fluticasone (FLONASE) 50 MCG/ACT nasal spray Place 2 sprays into both nostrils daily as needed for allergies. Qty: 16 g, Refills: 2    hydrOXYzine (ATARAX/VISTARIL) 25 MG tablet Take 1 tablet (25 mg total) by mouth 2 (two) times daily as needed. Qty: 60 tablet, Refills: 2   Associated Diagnoses: Depression with anxiety    Magnesium Oxide 400 (240 MG) MG TABS Take 1 tablet by mouth 2 (two) times daily. Qty: 60 tablet, Refills: 3    metolazone (ZAROXOLYN) 2.5 MG tablet Take 1 tablet (2.5 mg total) by mouth daily as needed (for fluid retention). Qty: 10 tablet, Refills: 6   Associated Diagnoses: Acute on chronic systolic heart failure    milrinone (PRIMACOR) 20 MG/100ML SOLN infusion Inject 0.375 mcg/kg/min into the vein continuous. 440mg  in 461ml infusion 2.5 ml per hour    potassium chloride  SA (K-DUR,KLOR-CON) 20 MEQ tablet Take 4 tabs in AM and 4 tabs in PM    senna-docusate (SENOKOT-S) 8.6-50 MG per tablet Take 1-2 tablets by mouth 2 (two) times daily as needed for mild constipation or moderate constipation. Qty: 60 tablet, Refills: 3    sucralfate (CARAFATE) 1 GM/10ML suspension Take 10 mLs (1 g total) by mouth 4 (four) times daily. Qty: 420 mL, Refills: 1    torsemide (DEMADEX) 20 MG tablet Take 20 mg by mouth See admin instructions. Takes three tablet in the morning (60mg ) and two tablet in the evening (40mg )    zolpidem (AMBIEN) 5 MG tablet Take 1 tablet (5 mg total) by mouth at bedtime as needed for sleep. Qty: 15 tablet, Refills: 0    promethazine (PHENERGAN) 25 MG tablet Take 25 mg by mouth every 6 (six) hours as needed for nausea or vomiting.       Allergies  Allergen Reactions  . Nitroglycerin Other (See Comments)    "feels like head is going to bust open"   Follow-up Information    Follow up with Glori Bickers, MD On 12/18/2013.   Specialty:  Cardiology   Why:  at 10:15 [place] information:   [address] [place] [address] [phone]        The results of significant diagnostics from this hospitalization (including imaging, microbiology, ancillary and laboratory) are listed below for reference.    Significant Diagnostic Studies: Dg Chest 2 View  12/10/2013   CLINICAL DATA:  Cough and shortness of breath.  EXAM: CHEST  2 VIEW  COMPARISON:  Chest x-rays dated 12/09/2013 and 08/06/2013  FINDINGS: AICD in place. PICC line in place with the tip just below the level of the carina in good position.  Heart size and pulmonary vascularity are normal. Lungs are clear except for a tiny area of linear scarring at the right lung base laterally.  No acute osseous abnormality.  IMPRESSION: No active cardiopulmonary disease.   Electronically Signed   By: Rozetta Nunnery M.D.   On: 12/10/2013 21:12   Ct Head Wo  Contrast  12/10/2013   CLINICAL DATA:  Headache and dizziness beginning last week, no trauma or  loss of consciousness.  EXAM: CT HEAD WITHOUT CONTRAST  TECHNIQUE: Contiguous axial images were obtained from the base of the skull through the vertex without intravenous contrast.  COMPARISON:  CT of the head October 07, 2011  FINDINGS: The ventricles and sulci are normal for age. No intraparenchymal hemorrhage, mass effect nor midline shift. Patchy supratentorial white matter hypodensities are within normal range for patient's age and though non-specific suggest sequelae of chronic small vessel ischemic disease. Mild basal ganglia mineralization. No acute large vascular territory infarcts. Re- demonstration of LEFT transcortical occipital lobe encephalomalacia.  No abnormal extra-axial fluid collections. Basal cisterns are patent. Moderate calcific atherosclerosis of the carotid siphons.  No skull fracture. The included ocular globes and orbital contents are non-suspicious. Trace paranasal sinus mucosal thickening without air-fluid levels. The mastoid air cells appear well-aerated. Pneumatized petrous apices.  IMPRESSION: No acute intracranial process.  Mild white matter changes suggest chronic small vessel ischemic disease. LEFT occipital encephalomalacia may be posttraumatic or, are represent remote LEFT posterior cerebral artery territory infarct.   Electronically Signed   By: Elon Alas   On: 12/10/2013 03:05   Dg Chest Port 1 View  12/09/2013   CLINICAL DATA:  Mid and left-sided chest pain. Pain down the left arm. Shortness of breath.  EXAM: PORTABLE CHEST - 1 VIEW  COMPARISON:  08/06/2013  FINDINGS: Cardiac pacemaker. Right central venous catheter with tip over the low SVC region. No pneumothorax. Shallow inspiration. Heart size and pulmonary vascularity are normal. Lungs appear clear and expanded. Postoperative changes in the cervical spine.  IMPRESSION: No active disease.   Electronically Signed   By:  Lucienne Capers M.D.   On: 12/09/2013 23:17   Dg Knee Complete 4 Views Right  11/27/2013   CLINICAL DATA:  One day history of pain  EXAM: RIGHT KNEE - COMPLETE 4+ VIEW  COMPARISON:  None.  FINDINGS: Frontal, lateral, and bilateral oblique views were obtained. There is no fracture or dislocation. There is a joint effusion. There is mild narrowing of the patellofemoral joint. There is spurring along the posterior aspect of the patella. There are scattered foci of intrameniscal calcification. There is a small loose body in Hoffa's fat pad.  IMPRESSION: Joint effusion present. No fracture or dislocation. Narrowing of the patellofemoral joint with areas of meniscal calcification. These are findings that may be seen with osteoarthritis. These are also findings that may be seen with calcium pyrophosphate deposition disease which may present clinically as pseudogout.   Electronically Signed   By: Lowella Grip M.D.   On: 11/27/2013 21:03    Microbiology: No results found for this or any previous visit (from the past 240 hour(s)).   Labs: Basic Metabolic Panel:  Recent Labs Lab 12/09/13 2250 12/10/13 0406 12/11/13 0525  NA 140 141 136*  K 3.9 4.0 4.1  CL 103 103 99  CO2 18* 22 25  GLUCOSE 170* 120* 127*  BUN 49* 47* 48*  CREATININE 2.20* 2.14* 2.24*  CALCIUM 9.8 9.9 9.7   Liver Function Tests:  Recent Labs Lab 12/09/13 2250  AST 48*  ALT 25  ALKPHOS 87  BILITOT 0.4  PROT 8.4*  ALBUMIN 3.4*   No results for input(s): LIPASE, AMYLASE in the last 168 hours. No results for input(s): AMMONIA in the last 168 hours. CBC:  Recent Labs Lab 12/09/13 2250  WBC 5.7  HGB 14.0  HCT 40.5  MCV 85.8  PLT 231   Cardiac Enzymes: No results for input(s): CKTOTAL, CKMB, CKMBINDEX, TROPONINI  in the last 168 hours. BNP: BNP (last 3 results)  Recent Labs  06/11/13 1254 08/06/13 1245 12/09/13 2250  PROBNP 542.4* 3225.0* 665.8*   CBG: No results for input(s): GLUCAP in the last 168  hours.     SignedKelvin Cellar  [facility] 12/11/2013, 11:58 AM

## 2013-12-15 ENCOUNTER — Encounter (HOSPITAL_COMMUNITY): Payer: Medicare Other

## 2013-12-15 ENCOUNTER — Telehealth: Payer: Self-pay

## 2013-12-15 NOTE — Telephone Encounter (Signed)
Done.  Please see phone note on 12/15/13.

## 2013-12-15 NOTE — Telephone Encounter (Signed)
Admit date: 12/09/2013 Discharge date: 12/11/2013  Recommendations for Outpatient Follow-up:  1. Please follow up on volume status, he was admitted for acute CHF 2. Follow up on BMP in 1 week   Transition Care Management Follow-up Telephone Call  How have you been since you were released from the hospital? Pt states that today is a good day. He has seen some improvement since discharge.  Denies shortness of breath, increased weight gain, chest pain, dizziness, or fatigue.  States he only becomes short of breath with overexertion.  States he is increasing activity slowly.  Weighing self daily. "Trying" to eat appropriately (low sodium heart healthy diet).     Do you understand why you were in the hospital? yes   Do you understand the discharge instructions? yes  Items Reviewed:  Medications reviewed: yes  Allergies reviewed: yes  Dietary changes reviewed: yes  Referrals reviewed: yes, has an appointment with Dr. Haroldine Laws, his cardiologist, on 12/18/13 @ 10:45 am.   Functional Questionnaire:   Activities of Daily Living (ADLs):   He states they are independent in the following: ambulation, bathing and hygiene, feeding, continence, grooming, toileting and dressing States they require assistance with the following: none   Any transportation issues/concerns?: no   Any patient concerns? Wanted to know who prescribes his metolazone.  Pt was made aware that Dr. Haroldine Laws prescribes this medication.     Confirmed importance and date/time of follow-up visits scheduled: yes   Confirmed with patient if condition begins to worsen call PCP or go to the ER.  Patient was given the [place] line [phone]: yes   Hospital follow up appointment with Debbrah Alar scheduled for Wednesday, 12/17/13 @ 1:45 pm.

## 2013-12-17 ENCOUNTER — Ambulatory Visit (INDEPENDENT_AMBULATORY_CARE_PROVIDER_SITE_OTHER): Payer: Medicare Other | Admitting: Family

## 2013-12-17 ENCOUNTER — Telehealth (HOSPITAL_COMMUNITY): Payer: Self-pay | Admitting: Cardiology

## 2013-12-17 ENCOUNTER — Encounter: Payer: Self-pay | Admitting: Family

## 2013-12-17 VITALS — BP 100/70 | HR 93 | Temp 97.7°F | Resp 16 | Ht 75.0 in | Wt 251.0 lb

## 2013-12-17 DIAGNOSIS — I5022 Chronic systolic (congestive) heart failure: Secondary | ICD-10-CM

## 2013-12-17 DIAGNOSIS — F418 Other specified anxiety disorders: Secondary | ICD-10-CM

## 2013-12-17 DIAGNOSIS — I5020 Unspecified systolic (congestive) heart failure: Secondary | ICD-10-CM

## 2013-12-17 LAB — BASIC METABOLIC PANEL
BUN: 49 mg/dL — AB (ref 6–23)
CO2: 23 mEq/L (ref 19–32)
CREATININE: 2.3 mg/dL — AB (ref 0.4–1.5)
Calcium: 9.6 mg/dL (ref 8.4–10.5)
Chloride: 105 mEq/L (ref 96–112)
GFR: 36.46 mL/min — AB (ref >60.00)
GLUCOSE: 120 mg/dL — AB (ref 70–99)
Potassium: 3.6 mEq/L (ref 3.5–5.1)
Sodium: 139 mEq/L (ref 135–145)

## 2013-12-17 MED ORDER — ESCITALOPRAM OXALATE 10 MG PO TABS: ORAL_TABLET | ORAL | Status: DC

## 2013-12-17 NOTE — Telephone Encounter (Signed)
Pt will need to get from PCP, will address at [place] 12/17

## 2013-12-17 NOTE — Patient Instructions (Addendum)
Start lexapro 10mg - 1/2 tab by mouth once daily for 1 week,then increase to a full tab once daily on week two. Complete lab work prior to leaving. Please schedule a follow up appointment in 1 month.

## 2013-12-17 NOTE — Progress Notes (Signed)
Subjective:    Patient ID: [name], male    DOB: [birth year], 63 y.o.   MRN: [id number]  HPI  Mr. Wacha is a 63 yr old male who presents today for hospital follow up. Patient was hospitalized 12/8-12/10 for acute chf exacerbation. Hospital discharge summary is reviewed.  Mr. Mcfetridge has known hx of NICM with EF 20-25%, s/p AICD placement. He is on continuous milrinone drip.  When he presented to the ED he reported vertigo. His weight was up 4 pounds at the time of admission. The patient was diuresed and was placed on demadex 60mg  in the morning and 40mg  in the evening. He was also placed on eplerone once daily, and ongoing milrinone. His dizziness was ultimately felt to be positional and was improved at the time of discharge.   Wt Readings from Last 3 Encounters:  12/17/13 251 lb (113.853 kg)  12/11/13 246 lb 7.6 oz (111.8 kg)  11/27/13 243 lb (110.224 kg)   Weight at home this AM without clothes 245. Reports baseline weight 243.  He was 244 pounds the day after discharge at home per home scale.    Reports very poor energy since well before his hospitalization. He continue to have some stomach pain but notes that this is not as bad as it used to be.    Complains of anxiety- reports that he is very restless all night.  Lives with his mom and his "crazy sister." wants to move in with his fiance in [place].  Looks forward to going to church. Reports that he has had 3 panic attacks in the last week. Denies SI/HI.  Review of Systems    see HPI  Past Medical History  Diagnosis Date  . CHF (congestive heart failure)   . Sarcoidosis   . Cardiomyopathy, dilated, nonischemic     non ischemic by cath  . Acute on chronic systolic heart failure   . Automatic implantable cardiac defibrillator in situ   . Atrial fibrillation   . NSVT (nonsustained ventricular tachycardia)   . GERD (gastroesophageal reflux disease)   . Hypercholesteremia   . Shortness of breath   . Chronic  kidney disease (CKD), stage III (moderate)   . Pacemaker   . Anginal pain   . Gout   . Hypertension     dr [name]  . Coronary artery disease   . Elevated PSA 06/24/2013  . Myocardial infarction   . Headache   . Neuropathy     History   Social History  . Marital Status: Single    Spouse Name: N/A    Number of Children: N/A  . Years of Education: N/A   Occupational History  . Not on file.   Social History Main Topics  . Smoking status: Never Smoker   . Smokeless tobacco: Never Used  . Alcohol Use: No  . Drug Use: Yes    Special: "Crack" cocaine, Cocaine     Comment: last use august 2004  . Sexual Activity: Not Currently   Other Topics Concern  . Not on file   Social History Narrative   3 sons- one in [place], 1 in [place], on in [place]   Retired from post office- on disability   Completed Bachelors from [employer]    Past Surgical History  Procedure Laterality Date  . Pacemaker insertion  2009    with ICD  . Tee without cardioversion  01/17/2011    Procedure: TRANSESOPHAGEAL ECHOCARDIOGRAM (TEE);  Surgeon: Lorenda Ishihara  Doylene Canard, MD;  Location: [facility] ENDOSCOPY;  Service: Cardiovascular;  Laterality: N/A;  . Cardioversion  01/17/2011    Procedure: CARDIOVERSION;  Surgeon: Birdie Riddle, MD;  Location: [place];  Service: Cardiovascular;  Laterality: N/A;  . Cardiac catheterization    . Insert / replace / remove pacemaker    . Anterior cervical decomp/discectomy fusion  08/21/2011    Procedure: ANTERIOR CERVICAL DECOMPRESSION/DISCECTOMY FUSION 2 LEVELS;  Surgeon: Marybelle Killings, MD;  Location: [place];  Service: Orthopedics;  Laterality: N/A;  C5-6, C6-7 Anterior Cervical Discectomy and Fusion, allograft, plate  . Tee without cardioversion N/A 02/16/2012    Procedure: TRANSESOPHAGEAL ECHOCARDIOGRAM (TEE);  Surgeon: Jolaine Artist, MD;  Location: [facility] ENDOSCOPY;  Service: Cardiovascular;  Laterality: N/A;  original case scheduled under his dad (who is deceased), rescheduled under correct  mrn/pt/dob. Cecil/dl  . Tee without cardioversion N/A 03/22/2012    Procedure: TRANSESOPHAGEAL ECHOCARDIOGRAM (TEE);  Surgeon: Lelon Perla, MD;  Location: [place];  Service: Cardiovascular;  Laterality: N/A;  . Cardioversion N/A 03/22/2012    Procedure: CARDIOVERSION;  Surgeon: Lelon Perla, MD;  Location: [facility] ENDOSCOPY;  Service: Cardiovascular;  Laterality: N/A;  . Cardioversion N/A 04/26/2012    Procedure: CARDIOVERSION;  Surgeon: Jolaine Artist, MD;  Location: [facility] ENDOSCOPY;  Service: Cardiovascular;  Laterality: N/A;  . Central venous catheter tunneled insertion single lumen  09/16/2012    right IJ  . Cardioversion N/A 11/08/2012    Procedure: CARDIOVERSION;  Surgeon: Jolaine Artist, MD;  Location: [facility] ENDOSCOPY;  Service: Cardiovascular;  Laterality: N/A;  . Back surgery  1987, lower    Ruptured disk repair  . Peripherally inserted central catheter insertion  2013  . Right heart catheterization N/A 01/26/2012    Procedure: RIGHT HEART CATH;  Surgeon: Jolaine Artist, MD;  Location: [facility] CATH LAB;  Service: Cardiovascular;  Laterality: N/A;  . Right heart catheterization N/A 04/30/2013    Procedure: RIGHT HEART CATH;  Surgeon: Jolaine Artist, MD;  Location: [facility] CATH LAB;  Service: Cardiovascular;  Laterality: N/A;    Family History  Problem Relation Age of Onset  . Heart disease    . Heart failure    . Stroke    . Anesthesia problems Neg Hx   . Hypotension Neg Hx   . Malignant hyperthermia Neg Hx   . Pseudochol deficiency Neg Hx     Allergies  Allergen Reactions  . Nitroglycerin Other (See Comments)    "feels like head is going to bust open"  . Colchicine Nausea Only    Current Outpatient Prescriptions on File Prior to Visit  Medication Sig Dispense Refill  . allopurinol (ZYLOPRIM) 100 MG tablet Take 1 tablet (100 mg total) by mouth daily. 30 tablet 6  . amiodarone (PACERONE) 200 MG tablet Take 200 mg by mouth 2 (two) times daily.     Marland Kitchen apixaban (ELIQUIS)  5 MG TABS tablet Take 1 tablet (5 mg total) by mouth 2 (two) times daily. 60 tablet 4  . carvedilol (COREG) 6.25 MG tablet Take 6.25 mg by mouth 2 (two) times daily with a meal.     . eplerenone (INSPRA) 25 MG tablet Take 25 mg by mouth daily.    Marland Kitchen esomeprazole (NEXIUM) 40 MG capsule Take 40 mg by mouth daily at 12 noon.    . fluticasone (FLONASE) 50 MCG/ACT nasal spray Place 2 sprays into both nostrils daily as needed for allergies. 16 g 2  . hydrOXYzine (ATARAX/VISTARIL) 25 MG tablet Take 1  tablet (25 mg total) by mouth 2 (two) times daily as needed. 60 tablet 2  . LORazepam (ATIVAN) 0.5 MG tablet Take 1 tablet (0.5 mg total) by mouth every 8 (eight) hours as needed for anxiety. 15 tablet 0  . Magnesium Oxide 400 (240 MG) MG TABS Take 1 tablet by mouth 2 (two) times daily. 60 tablet 3  . metolazone (ZAROXOLYN) 2.5 MG tablet Take 1 tablet (2.5 mg total) by mouth daily as needed (for fluid retention). 10 tablet 6  . milrinone (PRIMACOR) 20 MG/100ML SOLN infusion Inject 0.375 mcg/kg/min into the vein continuous. 440mg  in 448ml infusion 2.5 ml per hour    . Oxycodone HCl 20 MG TABS Take 1 tablet (20 mg total) by mouth 3 (three) times daily as needed (pain.). 15 tablet 0  . potassium chloride SA (K-DUR,KLOR-CON) 20 MEQ tablet Take 4 tabs in AM and 4 tabs in PM    . promethazine (PHENERGAN) 25 MG tablet Take 25 mg by mouth every 6 (six) hours as needed for nausea or vomiting.    . senna-docusate (SENOKOT-S) 8.6-50 MG per tablet Take 1-2 tablets by mouth 2 (two) times daily as needed for mild constipation or moderate constipation. 60 tablet 3  . sucralfate (CARAFATE) 1 GM/10ML suspension Take 10 mLs (1 g total) by mouth 4 (four) times daily. 420 mL 1  . torsemide (DEMADEX) 20 MG tablet Take 20 mg by mouth See admin instructions. Takes three tablet in the morning (60mg ) and two tablet in the evening (40mg )    . zolpidem (AMBIEN) 5 MG tablet Take 1 tablet (5 mg total) by mouth at bedtime as needed for  sleep. 15 tablet 0   No current facility-administered medications on file prior to visit.    BP 100/70 mmHg  Pulse 93  Temp(Src) 97.7 F (36.5 C) (Oral)  Resp 16  Ht 6\' 3"  (1.905 m)  Wt 251 lb (113.853 kg)  BMI 31.37 kg/m2  SpO2 99%    Objective:   Physical Exam  Constitutional: He appears well-developed and well-nourished. No distress.  Cardiovascular: Normal rate and regular rhythm.   No murmur heard. Pulmonary/Chest: Effort normal and breath sounds normal. No respiratory distress. He has no wheezes. He has no rales. He exhibits no tenderness.  Musculoskeletal:  2+ bilateral LE edema  Psychiatric: His behavior is normal. Judgment and thought content normal.  Seems somewhat irritable.           Assessment & Plan:  30 min spent with pt today.  >50% of this time was spent counseling pt on his anxiety and depression.

## 2013-12-17 NOTE — Progress Notes (Signed)
Pre visit review using our clinic review tool, if applicable. No additional management support is needed unless otherwise documented below in the visit note. 

## 2013-12-17 NOTE — Telephone Encounter (Signed)
[name] called after pts [name] visit Pt is c/o increased discomfort, pt normally takes Oxycodone TID however he is unable to get filled until 12/17/158. Pt would like to know if there is something else he can take until he can get his normal RX of Oxy ALSO pt is very restless, unable to rest at all, very uncomfortable Pt does have 0.5mg  of Ativan and it does not help, pt was getting Valium in the hospital and pt states this help him a lot. [name] would like to know if pt can get a rx for this.   Please advise

## 2013-12-18 ENCOUNTER — Ambulatory Visit (HOSPITAL_COMMUNITY)
Admit: 2013-12-18 | Discharge: 2013-12-18 | Disposition: A | Discharge status: Home / Self Care | Payer: Medicare Other | Attending: Internal Medicine | Admitting: Internal Medicine

## 2013-12-18 ENCOUNTER — Encounter: Payer: Self-pay | Admitting: Family

## 2013-12-18 ENCOUNTER — Encounter (HOSPITAL_COMMUNITY): Payer: Self-pay

## 2013-12-18 VITALS — BP 118/77 | HR 78 | Resp 18 | Wt 250.5 lb

## 2013-12-18 DIAGNOSIS — N184 Chronic kidney disease, stage 4 (severe): Secondary | ICD-10-CM | POA: Insufficient documentation

## 2013-12-18 DIAGNOSIS — G8929 Other chronic pain: Secondary | ICD-10-CM | POA: Diagnosis not present

## 2013-12-18 DIAGNOSIS — I129 Hypertensive chronic kidney disease with stage 1 through stage 4 chronic kidney disease, or unspecified chronic kidney disease: Secondary | ICD-10-CM | POA: Diagnosis not present

## 2013-12-18 DIAGNOSIS — I472 Ventricular tachycardia: Secondary | ICD-10-CM | POA: Diagnosis not present

## 2013-12-18 DIAGNOSIS — I4891 Unspecified atrial fibrillation: Secondary | ICD-10-CM | POA: Insufficient documentation

## 2013-12-18 DIAGNOSIS — I5022 Chronic systolic (congestive) heart failure: Secondary | ICD-10-CM | POA: Diagnosis present

## 2013-12-18 DIAGNOSIS — Z79899 Other long term (current) drug therapy: Secondary | ICD-10-CM | POA: Insufficient documentation

## 2013-12-18 DIAGNOSIS — I499 Cardiac arrhythmia, unspecified: Secondary | ICD-10-CM | POA: Diagnosis not present

## 2013-12-18 DIAGNOSIS — I480 Paroxysmal atrial fibrillation: Secondary | ICD-10-CM

## 2013-12-18 DIAGNOSIS — D869 Sarcoidosis, unspecified: Secondary | ICD-10-CM | POA: Diagnosis not present

## 2013-12-18 DIAGNOSIS — G894 Chronic pain syndrome: Secondary | ICD-10-CM

## 2013-12-18 NOTE — Assessment & Plan Note (Signed)
Will initiate lexapro 10 mg.  I instructed pt to start 1/2 tablet once daily for 1 week and then increase to a full tablet once daily on week two as tolerated.  We discussed common side effects such as nausea, drowsiness and weight gain.  Also discussed rare but serious side effect of suicide ideation.  She is instructed to discontinue medication go directly to ED if this occurs.  Pt verbalizes understanding.  Plan follow up in 1 month to evaluate progress.

## 2013-12-18 NOTE — Progress Notes (Signed)
Patient ID: [name], male   DOB: 1950-02-02, 63 y.o.   MRN: [id number] PCP: [name]   HPI: [name] is a 63 y.o. gentlemen with severe HF due to NICM (EF 20-25%) with multiple hospital admissions for HF exacerbations. He also has history of ventricular tachycardia s/p St Jude ICD by [name], CKD: stage IV, and paroxysmal atrial arrhythmias on amiodarone as well as sarcoidosis and hypertension. Cath in 2008 by [name] showed no CAD.Seen at [name] and not felt to be a transplant candidate due to renal failure and lack of family support.  Milrinone initiated in January 2014 for low output. 03/01/12 milrinone increased 0.368mc/kg/min.   ECHO 03/22/12 EF 15%  Echo 6/15: 20-25%  04/26/12 S/P Successful DC-CV for atrial fibrillation. Remains on amiodarone.   RHC 04/30/13 RA = 4  RV = 20/1/3  PA = 23/8 (15)  PCW = 5  Fick cardiac output/index =4.5/1.9  Thermo CO/CI = 3.9/1.6  PVR =  FA sat = 98%  PA sat = 62%, 64% - On milrinone 0.25  He returns for post hospital follow up. Admitted last week for possible ICD shock. Device was interrogated and no shock was noted. He remains on Milrinone 0.375 mcg. Taking all medications. [name] for home milrinone.  Complaining of fatigue. Ongoing dyspnea with exertion. Appetite ok.. Limiting fluid intake to less 2 liters. Having difficulty sleeping and coping with illness.   He is not on an ace inhibitor due to elevated creatinine. Not on nitrates due to Viagra use.   Labs:  08/15/12 Creatinine 4.24 Potassium 3.3 09/19/12 Creatinine 3.98, Potassium 3.1, BUN 78 10/07/12 Creatinine 2.62, Potassium 3.4 10/18/12 Creatinine 3.3, K+3.1 11/08/12 Creatinine 2.15 K 4.0 11/25/12 Creatinine 2.93, K+ 3.5 03/10/13: K+ 2.9, Creatinine 2.63 04/30/13: K+ 3.7, creatinine 2.4 6/15: K 3, creatinine 3.6 09/11/13: K 3.1 Cr 1.87  10/15: K 3.8, creatinine 2.13, HCT 40.7 11/06/13 K 4.0 Creatinine 1.58  12/17/13 K 3.6 Creatinine 2.3   ROS: All systems  negative except as listed in HPI, PMH and Problem List.  Past Medical History  Diagnosis Date  . CHF (congestive heart failure)   . Sarcoidosis   . Cardiomyopathy, dilated, nonischemic     non ischemic by cath  . Acute on chronic systolic heart failure   . Automatic implantable cardiac defibrillator in situ   . Atrial fibrillation   . NSVT (nonsustained ventricular tachycardia)   . GERD (gastroesophageal reflux disease)   . Hypercholesteremia   . Shortness of breath   . Chronic kidney disease (CKD), stage III (moderate)   . Pacemaker   . Anginal pain   . Gout   . Hypertension     [name]  . Coronary artery disease   . Elevated PSA 06/24/2013  . Myocardial infarction   . Headache   . Neuropathy    Current Outpatient Prescriptions  Medication Sig Dispense Refill  . allopurinol (ZYLOPRIM) 100 MG tablet Take 1 tablet (100 mg total) by mouth daily. 30 tablet 6  . amiodarone (PACERONE) 200 MG tablet Take 200 mg by mouth 2 (two) times daily.     Marland Kitchen apixaban (ELIQUIS) 5 MG TABS tablet Take 1 tablet (5 mg total) by mouth 2 (two) times daily. 60 tablet 4  . carvedilol (COREG) 6.25 MG tablet Take 6.25 mg by mouth 2 (two) times daily with a meal.     . eplerenone (INSPRA) 25 MG tablet Take 25 mg by mouth daily.    Marland Kitchen escitalopram (  LEXAPRO) 10 MG tablet 1/2 tab by mouth once daily for 1 week, then increase to a full tab once daily 30 tablet 0  . esomeprazole (NEXIUM) 40 MG capsule Take 40 mg by mouth daily at 12 noon.    . fluticasone (FLONASE) 50 MCG/ACT nasal spray Place 2 sprays into both nostrils daily as needed for allergies. 16 Darrell 2  . hydrOXYzine (ATARAX/VISTARIL) 25 MG tablet Take 1 tablet (25 mg total) by mouth 2 (two) times daily as needed. 60 tablet 2  . LORazepam (ATIVAN) 0.5 MG tablet Take 1 tablet (0.5 mg total) by mouth every 8 (eight) hours as needed for anxiety. 15 tablet 0  . Magnesium Oxide 400 (240 MG) MG TABS Take 1 tablet by mouth 2 (two) times daily. 60 tablet 3  .  metolazone (ZAROXOLYN) 2.5 MG tablet Take 1 tablet (2.5 mg total) by mouth daily as needed (for fluid retention). 10 tablet 6  . milrinone (PRIMACOR) 20 MG/100ML SOLN infusion Inject 0.375 mcg/kg/min into the vein continuous. 440mg  in 418ml infusion 2.5 ml per hour    . Oxycodone HCl 20 MG TABS Take 1 tablet (20 mg total) by mouth 3 (three) times daily as needed (pain.). 15 tablet 0  . potassium chloride SA (K-DUR,KLOR-CON) 20 MEQ tablet Take 4 tabs in AM and 4 tabs in PM    . promethazine (PHENERGAN) 25 MG tablet Take 25 mg by mouth every 6 (six) hours as needed for nausea or vomiting.    . senna-docusate (SENOKOT-S) 8.6-50 MG per tablet Take 1-2 tablets by mouth 2 (two) times daily as needed for mild constipation or moderate constipation. 60 tablet 3  . sucralfate (CARAFATE) 1 GM/10ML suspension Take 10 mLs (1 Darrell total) by mouth 4 (four) times daily. 420 mL 1  . torsemide (DEMADEX) 20 MG tablet Take 20 mg by mouth See admin instructions. Takes three tablet in the morning (60mg ) and two tablet in the evening (40mg )    . zolpidem (AMBIEN) 5 MG tablet Take 1 tablet (5 mg total) by mouth at bedtime as needed for sleep. 15 tablet 0   No current facility-administered medications for this encounter.   Filed Vitals:   12/18/13 1029  BP: 118/77  Pulse: 78  Resp: 18  Weight: 250 lb 8 oz (113.626 kg)  SpO2: 98%    PHYSICAL EXAM: General: NAD; Chronically ill appearing HEENT: normal  Neck: supple. JVP 6-7 cm. Carotids 2+ bilat; no bruits. No lymphadenopathy or thyromegaly appreciated.  Cor: PMI laterally displaced. Regular S1 S2 with no S3 and 2/6 HSM apex. R upper chest hickman site with milrinone infusing.  Lungs: clear Abdomen: soft, nontender, obese. No bruits or masses. Good bowel sounds.  Mild abdominal distention.  Extremities: no cyanosis, clubbing, rash, no lower extremity edema  Neuro: alert & orientedx3, cranial nerves grossly intact. moves all 4 extremities w/o difficulty. Affect  pleasant     ASSESSMENT & PLAN:   1) Chronic systolic HF: NICM, EF 90-30% (06/2013). St Jude ICD. He has been on home milrinone 0.375 mcg/kg/min since January 2014. NYHA III.   - Continue current milrinone dose.  - Continue coreg 6.25 mg BID - Continue Inspra 12.5 mg daily  - Continue torsemide 60/40 with metolazone once a week.  - No ACEI with CKD - Continue Darrell with weekly BMET Refer to HF SW for coping strategies.   2) Atrial arrhythmias: Paroxysmal. Regular rhythm. On Eliquis.   3) CKD, stage IV: Recheck creatinine today.   4) Chronic pain:  Per pain clinic.    Follow up in 6 weeks. Continue Darrell for home milrinone.    Monie Shere NP-C  12/18/2013

## 2013-12-18 NOTE — Patient Instructions (Signed)
Doing great!  Will have our social worker contact you.  Follow up 6 weeks.  Merry Christmas and Happy New Year!  Do the following things EVERYDAY: 1) Weigh yourself in the morning before breakfast. Write it down and keep it in a log. 2) Take your medicines as prescribed 3) Eat low salt foods-Limit salt (sodium) to 2000 mg per day.  4) Stay as active as you can everyday 5) Limit all fluids for the day to less than 2 liters

## 2013-12-18 NOTE — Assessment & Plan Note (Signed)
Volume stable.  Management per cardiology.

## 2013-12-22 ENCOUNTER — Ambulatory Visit: Payer: Medicare Other | Admitting: Gastroenterology

## 2013-12-22 ENCOUNTER — Telehealth: Payer: Self-pay | Admitting: Licensed Clinical Social Worker

## 2013-12-22 NOTE — Telephone Encounter (Signed)
CSW referred to assist with transportation and community resources. CSW attempted to reach patient with no success. Will continue to attempt contact. Raquel Sarna, [place]

## 2013-12-29 ENCOUNTER — Encounter: Payer: Self-pay | Admitting: Internal Medicine

## 2013-12-31 ENCOUNTER — Telehealth (HOSPITAL_COMMUNITY): Payer: Self-pay | Admitting: Vascular Surgery

## 2013-12-31 NOTE — Telephone Encounter (Signed)
Unable to refill ambien Pt should seek refills from PCP Detailed message left for pt with instructions to contact pcp

## 2013-12-31 NOTE — Telephone Encounter (Signed)
Refill Ambien sent to [facility] in [place]

## 2014-01-05 ENCOUNTER — Telehealth: Payer: Self-pay | Admitting: Licensed Clinical Social Worker

## 2014-01-05 NOTE — Telephone Encounter (Signed)
CSW contacted patient to discuss referral. Patient reports that he has been feeling depressed lately and requested to come into the clinic to meet with CSW for supportive Counseling. Patient will meet in the clinic on Wednesday January 07, 2014 at 9:15am. Raquel Sarna, [place]

## 2014-01-07 ENCOUNTER — Encounter: Payer: Self-pay | Admitting: Licensed Clinical Social Worker

## 2014-01-08 ENCOUNTER — Telehealth (HOSPITAL_COMMUNITY): Payer: Self-pay | Admitting: Vascular Surgery

## 2014-01-08 DIAGNOSIS — I509 Heart failure, unspecified: Secondary | ICD-10-CM | POA: Diagnosis not present

## 2014-01-08 DIAGNOSIS — F418 Other specified anxiety disorders: Secondary | ICD-10-CM | POA: Diagnosis not present

## 2014-01-08 DIAGNOSIS — I5020 Unspecified systolic (congestive) heart failure: Secondary | ICD-10-CM | POA: Diagnosis not present

## 2014-01-08 DIAGNOSIS — I5022 Chronic systolic (congestive) heart failure: Secondary | ICD-10-CM | POA: Diagnosis not present

## 2014-01-08 DIAGNOSIS — R0602 Shortness of breath: Secondary | ICD-10-CM | POA: Diagnosis not present

## 2014-01-08 NOTE — Progress Notes (Signed)
CSW met with patient in the clinic for supportive counseling. Patient spoke at length about his home life and the stressors with family members. Patient admitted that it is not the best environment for him to maintain good health. He states that his siblings are not supportive and his mother provides support but "does not take sides with arguments between us". Patient stated that he has a male friend who is very supportive along with his church community. Patient stated that he previously lived on his own but moved back home to live with his mother for the support when his health declined.CSW discussed housing and exploring alternative options. Patient considering possible move and will look into options provided by CSW. CSW will continue to be available for supportive counseling and further discussion about home/family stressors. Jackie , LCSW [phone] 

## 2014-01-08 NOTE — Telephone Encounter (Signed)
Nurse from [facility] called pt  Has a headache, dizziness and Nausea.. Pt is having burning pain in chest, Vital are ok.. Pt does not have temp.. Please advise

## 2014-01-08 NOTE — Telephone Encounter (Signed)
Spoke w/pt, he states he has been feeling bad for the past week, he states he is dizzy all the time and has no appetite at all, he states wt is stable at 139 today, usually it runs 139-142 lb, he states when he is up walking around his house he is SOB, which is new over the past week, he denies edema and doesn't feel like he is in a-fib.  He does state he is very fatigued and has no energy, appt sch for tomorrow

## 2014-01-09 ENCOUNTER — Encounter (HOSPITAL_COMMUNITY): Payer: Self-pay

## 2014-01-09 ENCOUNTER — Inpatient Hospital Stay (HOSPITAL_COMMUNITY)
Admission: AD | Admit: 2014-01-09 | Discharge: 2014-01-15 | DRG: 315 | Disposition: A | Discharge status: Home / Self Care | Payer: Medicare Other | Source: Ambulatory Visit | Attending: Internal Medicine | Admitting: Internal Medicine

## 2014-01-09 ENCOUNTER — Ambulatory Visit (HOSPITAL_BASED_OUTPATIENT_CLINIC_OR_DEPARTMENT_OTHER)
Admission: RE | Admit: 2014-01-09 | Discharge: 2014-01-09 | Disposition: A | Discharge status: Home / Self Care | Payer: Medicare Other | Source: Ambulatory Visit | Attending: Internal Medicine | Admitting: Internal Medicine

## 2014-01-09 ENCOUNTER — Ambulatory Visit: Payer: Medicare Other | Admitting: Family

## 2014-01-09 ENCOUNTER — Encounter (HOSPITAL_COMMUNITY): Payer: Self-pay | Admitting: General Practice

## 2014-01-09 VITALS — BP 76/55 | HR 96 | Resp 18 | Wt 243.4 lb

## 2014-01-09 DIAGNOSIS — R509 Fever, unspecified: Secondary | ICD-10-CM | POA: Diagnosis not present

## 2014-01-09 DIAGNOSIS — I4729 Other ventricular tachycardia: Secondary | ICD-10-CM

## 2014-01-09 DIAGNOSIS — B999 Unspecified infectious disease: Secondary | ICD-10-CM

## 2014-01-09 DIAGNOSIS — E780 Pure hypercholesterolemia: Secondary | ICD-10-CM | POA: Diagnosis not present

## 2014-01-09 DIAGNOSIS — E876 Hypokalemia: Secondary | ICD-10-CM | POA: Diagnosis not present

## 2014-01-09 DIAGNOSIS — I2510 Atherosclerotic heart disease of native coronary artery without angina pectoris: Secondary | ICD-10-CM | POA: Diagnosis not present

## 2014-01-09 DIAGNOSIS — Z7982 Long term (current) use of aspirin: Secondary | ICD-10-CM

## 2014-01-09 DIAGNOSIS — I480 Paroxysmal atrial fibrillation: Secondary | ICD-10-CM | POA: Diagnosis not present

## 2014-01-09 DIAGNOSIS — M109 Gout, unspecified: Secondary | ICD-10-CM | POA: Diagnosis present

## 2014-01-09 DIAGNOSIS — T888XXA Other specified complications of surgical and medical care, not elsewhere classified, initial encounter: Secondary | ICD-10-CM

## 2014-01-09 DIAGNOSIS — I5022 Chronic systolic (congestive) heart failure: Secondary | ICD-10-CM | POA: Diagnosis present

## 2014-01-09 DIAGNOSIS — I129 Hypertensive chronic kidney disease with stage 1 through stage 4 chronic kidney disease, or unspecified chronic kidney disease: Secondary | ICD-10-CM | POA: Diagnosis present

## 2014-01-09 DIAGNOSIS — D869 Sarcoidosis, unspecified: Secondary | ICD-10-CM | POA: Diagnosis present

## 2014-01-09 DIAGNOSIS — T80211A Bloodstream infection due to central venous catheter, initial encounter: Principal | ICD-10-CM | POA: Diagnosis present

## 2014-01-09 DIAGNOSIS — Z95810 Presence of automatic (implantable) cardiac defibrillator: Secondary | ICD-10-CM

## 2014-01-09 DIAGNOSIS — G629 Polyneuropathy, unspecified: Secondary | ICD-10-CM | POA: Diagnosis not present

## 2014-01-09 DIAGNOSIS — I951 Orthostatic hypotension: Secondary | ICD-10-CM | POA: Diagnosis present

## 2014-01-09 DIAGNOSIS — G8929 Other chronic pain: Secondary | ICD-10-CM | POA: Diagnosis not present

## 2014-01-09 DIAGNOSIS — I5023 Acute on chronic systolic (congestive) heart failure: Secondary | ICD-10-CM

## 2014-01-09 DIAGNOSIS — A4150 Gram-negative sepsis, unspecified: Secondary | ICD-10-CM | POA: Diagnosis not present

## 2014-01-09 DIAGNOSIS — I5021 Acute systolic (congestive) heart failure: Secondary | ICD-10-CM | POA: Diagnosis not present

## 2014-01-09 DIAGNOSIS — I420 Dilated cardiomyopathy: Secondary | ICD-10-CM | POA: Diagnosis not present

## 2014-01-09 DIAGNOSIS — N184 Chronic kidney disease, stage 4 (severe): Secondary | ICD-10-CM | POA: Diagnosis present

## 2014-01-09 DIAGNOSIS — R7881 Bacteremia: Secondary | ICD-10-CM

## 2014-01-09 DIAGNOSIS — I472 Ventricular tachycardia, unspecified: Secondary | ICD-10-CM

## 2014-01-09 DIAGNOSIS — Z79899 Other long term (current) drug therapy: Secondary | ICD-10-CM

## 2014-01-09 DIAGNOSIS — I252 Old myocardial infarction: Secondary | ICD-10-CM

## 2014-01-09 DIAGNOSIS — N183 Chronic kidney disease, stage 3 (moderate): Secondary | ICD-10-CM | POA: Diagnosis not present

## 2014-01-09 DIAGNOSIS — I959 Hypotension, unspecified: Secondary | ICD-10-CM

## 2014-01-09 LAB — COMPREHENSIVE METABOLIC PANEL
ALK PHOS: 72 U/L (ref 39–117)
ALT: 23 U/L (ref 0–53)
ANION GAP: 6 (ref 5–15)
AST: 35 U/L (ref 0–37)
Albumin: 3.2 g/dL — ABNORMAL LOW (ref 3.5–5.2)
BILIRUBIN TOTAL: 1.2 mg/dL (ref 0.3–1.2)
BUN: 38 mg/dL — ABNORMAL HIGH (ref 6–23)
CO2: 28 mmol/L (ref 19–32)
Calcium: 9.3 mg/dL (ref 8.4–10.5)
Chloride: 103 mEq/L (ref 96–112)
Creatinine, Ser: 2.41 mg/dL — ABNORMAL HIGH (ref 0.50–1.35)
GFR, EST AFRICAN AMERICAN: 31 mL/min — AB (ref >90)
GFR, EST NON AFRICAN AMERICAN: 27 mL/min — AB (ref >90)
GLUCOSE: 131 mg/dL — AB (ref 70–99)
Potassium: 3.1 mmol/L — ABNORMAL LOW (ref 3.5–5.1)
Sodium: 137 mmol/L (ref 135–145)
Total Protein: 7 g/dL (ref 6.0–8.3)

## 2014-01-09 LAB — CBC WITH DIFFERENTIAL/PLATELET
BASOS ABS: 0 10*3/uL (ref 0.0–0.1)
Basophils Relative: 0 % (ref 0–1)
EOS ABS: 0.1 10*3/uL (ref 0.0–0.7)
Eosinophils Relative: 1 % (ref 0–5)
HCT: 36.2 % — ABNORMAL LOW (ref 39.0–52.0)
Hemoglobin: 12.1 g/dL — ABNORMAL LOW (ref 13.0–17.0)
LYMPHS ABS: 1.4 10*3/uL (ref 0.7–4.0)
Lymphocytes Relative: 16 % (ref 12–46)
MCH: 28.5 pg (ref 26.0–34.0)
MCHC: 33.4 g/dL (ref 30.0–36.0)
MCV: 85.4 fL (ref 78.0–100.0)
Monocytes Absolute: 1.3 10*3/uL — ABNORMAL HIGH (ref 0.1–1.0)
Monocytes Relative: 14 % — ABNORMAL HIGH (ref 3–12)
NEUTROS ABS: 6.1 10*3/uL (ref 1.7–7.7)
NEUTROS PCT: 69 % (ref 43–77)
Platelets: 146 10*3/uL — ABNORMAL LOW (ref 150–400)
RBC: 4.24 MIL/uL (ref 4.22–5.81)
RDW: 13.3 % (ref 11.5–15.5)
WBC: 8.8 10*3/uL (ref 4.0–10.5)

## 2014-01-09 LAB — CBC
HCT: 36.8 % — ABNORMAL LOW (ref 39.0–52.0)
HEMOGLOBIN: 12.4 g/dL — AB (ref 13.0–17.0)
MCH: 28.7 pg (ref 26.0–34.0)
MCHC: 33.7 g/dL (ref 30.0–36.0)
MCV: 85.2 fL (ref 78.0–100.0)
Platelets: 141 10*3/uL — ABNORMAL LOW (ref 150–400)
RBC: 4.32 MIL/uL (ref 4.22–5.81)
RDW: 13.2 % (ref 11.5–15.5)
WBC: 7.8 10*3/uL (ref 4.0–10.5)

## 2014-01-09 LAB — BRAIN NATRIURETIC PEPTIDE: B NATRIURETIC PEPTIDE 5: 178 pg/mL — AB (ref 0.0–100.0)

## 2014-01-09 LAB — MAGNESIUM: Magnesium: 2.2 mg/dL (ref 1.5–2.5)

## 2014-01-09 MED ORDER — AMIODARONE HCL 200 MG PO TABS
200.0000 mg | ORAL_TABLET | Freq: Two times a day (BID) | ORAL | Status: DC
  Filled 2014-01-09 (×2): qty 1

## 2014-01-09 MED ORDER — OXYCODONE HCL 20 MG PO TABS: 1.0000 | ORAL_TABLET | Freq: Three times a day (TID) | ORAL | Status: DC | PRN

## 2014-01-09 MED ORDER — SPIRONOLACTONE 25 MG PO TABS
25.0000 mg | ORAL_TABLET | Freq: Every day | ORAL | Status: DC
  Administered 2014-01-09 – 2014-01-15 (×7): 25 mg via ORAL
  Filled 2014-01-09 (×7): qty 1

## 2014-01-09 MED ORDER — SODIUM CHLORIDE 0.9 % IJ SOLN
10.0000 mL | INTRAMUSCULAR | Status: DC | PRN
  Administered 2014-01-10: 10 mL
  Filled 2014-01-09: qty 40

## 2014-01-09 MED ORDER — SODIUM CHLORIDE 0.9 % IJ SOLN: 3.0000 mL | INTRAMUSCULAR | Status: DC | PRN

## 2014-01-09 MED ORDER — VANCOMYCIN HCL 10 G IV SOLR
2000.0000 mg | Freq: Once | INTRAVENOUS | Status: AC
  Administered 2014-01-09: 2000 mg via INTRAVENOUS
  Filled 2014-01-09: qty 2000

## 2014-01-09 MED ORDER — HYDROXYZINE HCL 25 MG PO TABS
25.0000 mg | ORAL_TABLET | Freq: Two times a day (BID) | ORAL | Status: DC | PRN
  Filled 2014-01-09: qty 1

## 2014-01-09 MED ORDER — TORSEMIDE 20 MG PO TABS
40.0000 mg | ORAL_TABLET | Freq: Every day | ORAL | Status: DC
  Filled 2014-01-09: qty 2

## 2014-01-09 MED ORDER — MAGNESIUM OXIDE -MG SUPPLEMENT 400 (240 MG) MG PO TABS: 1.0000 | ORAL_TABLET | Freq: Two times a day (BID) | ORAL | Status: DC

## 2014-01-09 MED ORDER — MILRINONE IN DEXTROSE 20 MG/100ML IV SOLN
0.3750 ug/kg/min | INTRAVENOUS | Status: DC
  Administered 2014-01-09 – 2014-01-15 (×14): 0.375 ug/kg/min via INTRAVENOUS
  Filled 2014-01-09 (×18): qty 100

## 2014-01-09 MED ORDER — SODIUM CHLORIDE 0.9 % IV SOLN: 250.0000 mL | INTRAVENOUS | Status: DC | PRN

## 2014-01-09 MED ORDER — PROMETHAZINE HCL 25 MG PO TABS: 25.0000 mg | ORAL_TABLET | Freq: Four times a day (QID) | ORAL | Status: DC | PRN

## 2014-01-09 MED ORDER — SODIUM CHLORIDE 0.9 % IJ SOLN
10.0000 mL | Freq: Two times a day (BID) | INTRAMUSCULAR | Status: DC
  Administered 2014-01-09 – 2014-01-13 (×2): 10 mL

## 2014-01-09 MED ORDER — PANTOPRAZOLE SODIUM 40 MG PO TBEC
80.0000 mg | DELAYED_RELEASE_TABLET | Freq: Every day | ORAL | Status: DC
  Administered 2014-01-09 – 2014-01-14 (×6): 80 mg via ORAL
  Filled 2014-01-09 (×5): qty 2

## 2014-01-09 MED ORDER — ACETAMINOPHEN 325 MG PO TABS: 650.0000 mg | ORAL_TABLET | ORAL | Status: DC | PRN

## 2014-01-09 MED ORDER — APIXABAN 5 MG PO TABS
5.0000 mg | ORAL_TABLET | Freq: Two times a day (BID) | ORAL | Status: DC
  Administered 2014-01-09 – 2014-01-15 (×13): 5 mg via ORAL
  Filled 2014-01-09 (×14): qty 1

## 2014-01-09 MED ORDER — ALLOPURINOL 100 MG PO TABS
100.0000 mg | ORAL_TABLET | Freq: Every day | ORAL | Status: DC
  Administered 2014-01-09 – 2014-01-15 (×7): 100 mg via ORAL
  Filled 2014-01-09 (×7): qty 1

## 2014-01-09 MED ORDER — ONDANSETRON HCL 4 MG/2ML IJ SOLN
4.0000 mg | Freq: Four times a day (QID) | INTRAMUSCULAR | Status: DC | PRN
  Administered 2014-01-10: 4 mg via INTRAVENOUS
  Filled 2014-01-09: qty 2

## 2014-01-09 MED ORDER — ESCITALOPRAM OXALATE 10 MG PO TABS
10.0000 mg | ORAL_TABLET | Freq: Every day | ORAL | Status: DC
  Administered 2014-01-09 – 2014-01-15 (×7): 10 mg via ORAL
  Filled 2014-01-09 (×7): qty 1

## 2014-01-09 MED ORDER — SODIUM CHLORIDE 0.9 % IV SOLN
INTRAVENOUS | Status: DC
Start: 2014-01-09 — End: 2014-01-11
  Administered 2014-01-09: 15:00:00 via INTRAVENOUS

## 2014-01-09 MED ORDER — VANCOMYCIN HCL 10 G IV SOLR
1250.0000 mg | INTRAVENOUS | Status: DC
  Administered 2014-01-11: 1250 mg via INTRAVENOUS
  Filled 2014-01-09 (×2): qty 1250

## 2014-01-09 MED ORDER — SUCRALFATE 1 GM/10ML PO SUSP
1.0000 g | Freq: Three times a day (TID) | ORAL | Status: DC
  Administered 2014-01-09 – 2014-01-15 (×25): 1 g via ORAL
  Filled 2014-01-09 (×28): qty 10

## 2014-01-09 MED ORDER — SENNOSIDES-DOCUSATE SODIUM 8.6-50 MG PO TABS
1.0000 | ORAL_TABLET | Freq: Two times a day (BID) | ORAL | Status: DC | PRN
Start: 2014-01-09 — End: 2014-01-15
  Administered 2014-01-13: 2 via ORAL
  Filled 2014-01-09 (×2): qty 2

## 2014-01-09 MED ORDER — LORAZEPAM 0.5 MG PO TABS
0.5000 mg | ORAL_TABLET | Freq: Three times a day (TID) | ORAL | Status: DC | PRN
  Administered 2014-01-11 – 2014-01-13 (×4): 0.5 mg via ORAL
  Filled 2014-01-09 (×4): qty 1

## 2014-01-09 MED ORDER — SODIUM CHLORIDE 0.9 % IJ SOLN
3.0000 mL | Freq: Two times a day (BID) | INTRAMUSCULAR | Status: DC
  Administered 2014-01-09 – 2014-01-15 (×7): 3 mL via INTRAVENOUS

## 2014-01-09 MED ORDER — OXYCODONE HCL 5 MG PO TABS
20.0000 mg | ORAL_TABLET | Freq: Three times a day (TID) | ORAL | Status: DC | PRN
  Administered 2014-01-09: 20 mg via ORAL
  Filled 2014-01-09: qty 4

## 2014-01-09 MED ORDER — PIPERACILLIN-TAZOBACTAM 3.375 G IVPB
3.3750 g | Freq: Three times a day (TID) | INTRAVENOUS | Status: DC
  Administered 2014-01-09 – 2014-01-12 (×8): 3.375 g via INTRAVENOUS
  Filled 2014-01-09 (×11): qty 50

## 2014-01-09 MED ORDER — MILRINONE IN DEXTROSE 20 MG/100ML IV SOLN: 0.3750 ug/kg/min | INTRAVENOUS | Status: DC

## 2014-01-09 MED ORDER — ACETAMINOPHEN 325 MG PO TABS
650.0000 mg | ORAL_TABLET | ORAL | Status: DC | PRN
  Administered 2014-01-10: 650 mg via ORAL
  Filled 2014-01-09 (×2): qty 2

## 2014-01-09 MED ORDER — FLUTICASONE PROPIONATE 50 MCG/ACT NA SUSP
2.0000 | Freq: Every day | NASAL | Status: DC | PRN
  Filled 2014-01-09: qty 16

## 2014-01-09 MED ORDER — MAGNESIUM OXIDE 400 (241.3 MG) MG PO TABS
400.0000 mg | ORAL_TABLET | Freq: Two times a day (BID) | ORAL | Status: DC
  Administered 2014-01-09 – 2014-01-15 (×12): 400 mg via ORAL
  Filled 2014-01-09 (×15): qty 1

## 2014-01-09 MED ORDER — TORSEMIDE 20 MG PO TABS
60.0000 mg | ORAL_TABLET | Freq: Every day | ORAL | Status: DC
  Filled 2014-01-09 (×2): qty 3

## 2014-01-09 MED ORDER — POTASSIUM CHLORIDE CRYS ER 20 MEQ PO TBCR
60.0000 meq | EXTENDED_RELEASE_TABLET | Freq: Two times a day (BID) | ORAL | Status: DC
  Administered 2014-01-09 (×2): 60 meq via ORAL
  Filled 2014-01-09 (×4): qty 3

## 2014-01-09 MED ORDER — ZOLPIDEM TARTRATE 5 MG PO TABS
5.0000 mg | ORAL_TABLET | Freq: Every evening | ORAL | Status: DC | PRN
  Administered 2014-01-10 – 2014-01-14 (×5): 5 mg via ORAL
  Filled 2014-01-09 (×5): qty 1

## 2014-01-09 NOTE — Progress Notes (Addendum)
Patient ID: [name], male   DOB: 1950-04-15, 64 y.o.   MRN: [id number] PCP: Debbrah Alar   HPI: [name] is a 64 y.o. gentlemen with severe HF due to NICM (EF 20-25%) with multiple hospital admissions for HF exacerbations. He also has history of ventricular tachycardia s/p St Jude ICD by Dr. Lovena Le, CKD: stage IV, and paroxysmal atrial arrhythmias on amiodarone as well as sarcoidosis and hypertension. Cath in 2008 by Dr. Terrence Dupont showed no CAD.Seen at [facility] and not felt to be a transplant candidate due to renal failure and lack of family support.  Milrinone initiated in January 2014 for low output. 03/01/12 milrinone increased 0.373mc/kg/min.   ECHO 03/22/12 EF 15%  Echo 6/15: 20-25%  04/26/12 S/P Successful DC-CV for atrial fibrillation. Remains on amiodarone.   RHC 04/30/13 RA = 4  RV = 20/1/3  PA = 23/8 (15)  PCW = 5  Fick cardiac output/index =4.5/1.9  Thermo CO/CI = 3.9/1.6  PVR =  FA sat = 98%  PA sat = 62%, 64% - On milrinone 0.25  He returns for an acute work in due to increased dyspnea and fatigue. Says he has been dizzy for the last few days.  Weight at home 239 pounds. Has no appetite. Having nausea. He remains on Milrinone 0.375 mcg. Taking all medications. [facility] following for home milrinone.  Complaining of fatigue. Ongoing dyspnea with exertion. Appetite ok.. Limiting fluid intake to less 2 liters. Having difficulty sleeping and coping with illness.   He is not on an ace inhibitor due to elevated creatinine. Not on nitrates due to Viagra use.   Labs:  08/15/12 Creatinine 4.24 Potassium 3.3 09/19/12 Creatinine 3.98, Potassium 3.1, BUN 78 10/07/12 Creatinine 2.62, Potassium 3.4 10/18/12 Creatinine 3.3, K+3.1 11/08/12 Creatinine 2.15 K 4.0 11/25/12 Creatinine 2.93, K+ 3.5 03/10/13: K+ 2.9, Creatinine 2.63 04/30/13: K+ 3.7, creatinine 2.4 6/15: K 3, creatinine 3.6 09/11/13: K 3.1 Cr 1.87  10/15: K 3.8, creatinine 2.13, HCT 40.7 11/06/13 K 4.0 Creatinine 1.58   12/17/13 K 3.6 Creatinine 2.3   ROS: All systems negative except as listed in HPI, PMH and Problem List.  Past Medical History  Diagnosis Date  . CHF (congestive heart failure)   . Sarcoidosis   . Cardiomyopathy, dilated, nonischemic     non ischemic by cath  . Acute on chronic systolic heart failure   . Automatic implantable cardiac defibrillator in situ   . Atrial fibrillation   . NSVT (nonsustained ventricular tachycardia)   . GERD (gastroesophageal reflux disease)   . Hypercholesteremia   . Shortness of breath   . Chronic kidney disease (CKD), stage III (moderate)   . Pacemaker   . Anginal pain   . Gout   . Hypertension     dr [name]  . Coronary artery disease   . Elevated PSA 06/24/2013  . Myocardial infarction   . Headache   . Neuropathy    Current Outpatient Prescriptions  Medication Sig Dispense Refill  . allopurinol (ZYLOPRIM) 100 MG tablet Take 1 tablet (100 mg total) by mouth daily. 30 tablet 6  . amiodarone (PACERONE) 200 MG tablet Take 200 mg by mouth 2 (two) times daily.     Marland Kitchen apixaban (ELIQUIS) 5 MG TABS tablet Take 1 tablet (5 mg total) by mouth 2 (two) times daily. 60 tablet 4  . carvedilol (COREG) 6.25 MG tablet Take 6.25 mg by mouth 2 (two) times daily with a meal.     . eplerenone (INSPRA) 25 MG  tablet Take 25 mg by mouth daily.    Marland Kitchen escitalopram (LEXAPRO) 10 MG tablet 1/2 tab by mouth once daily for 1 week, then increase to a full tab once daily 30 tablet 0  . esomeprazole (NEXIUM) 40 MG capsule Take 40 mg by mouth daily at 12 noon.    . fluticasone (FLONASE) 50 MCG/ACT nasal spray Place 2 sprays into both nostrils daily as needed for allergies. 16 g 2  . hydrOXYzine (ATARAX/VISTARIL) 25 MG tablet Take 1 tablet (25 mg total) by mouth 2 (two) times daily as needed. 60 tablet 2  . LORazepam (ATIVAN) 0.5 MG tablet Take 1 tablet (0.5 mg total) by mouth every 8 (eight) hours as needed for anxiety. 15 tablet 0  . Magnesium Oxide 400 (240 MG) MG TABS Take 1  tablet by mouth 2 (two) times daily. 60 tablet 3  . metolazone (ZAROXOLYN) 2.5 MG tablet Take 1 tablet (2.5 mg total) by mouth daily as needed (for fluid retention). 10 tablet 6  . milrinone (PRIMACOR) 20 MG/100ML SOLN infusion Inject 0.375 mcg/kg/min into the vein continuous. 440mg  in 415ml infusion 2.5 ml per hour    . Oxycodone HCl 20 MG TABS Take 1 tablet (20 mg total) by mouth 3 (three) times daily as needed (pain.). 15 tablet 0  . potassium chloride SA (K-DUR,KLOR-CON) 20 MEQ tablet Take 4 tabs in AM and 4 tabs in PM    . promethazine (PHENERGAN) 25 MG tablet Take 25 mg by mouth every 6 (six) hours as needed for nausea or vomiting.    . senna-docusate (SENOKOT-S) 8.6-50 MG per tablet Take 1-2 tablets by mouth 2 (two) times daily as needed for mild constipation or moderate constipation. 60 tablet 3  . sucralfate (CARAFATE) 1 GM/10ML suspension Take 10 mLs (1 g total) by mouth 4 (four) times daily. 420 mL 1  . torsemide (DEMADEX) 20 MG tablet Take 20 mg by mouth See admin instructions. Takes three tablet in the morning (60mg ) and two tablet in the evening (40mg )    . zolpidem (AMBIEN) 5 MG tablet Take 1 tablet (5 mg total) by mouth at bedtime as needed for sleep. 15 tablet 0   No current facility-administered medications for this encounter.   Filed Vitals:   01/09/14 0839  BP: 76/55  Pulse: 96  Resp: 18  Weight: 243 lb 6 oz (110.394 kg)  SpO2: 94%    PHYSICAL EXAM: General: NAD; Chronically ill appearing. Dypsnea walking in the clinic.  HEENT: normal  Neck: supple. JVP 6-7 cm. Carotids 2+ bilat; no bruits. No lymphadenopathy or thyromegaly appreciated.  Cor: PMI laterally displaced. Regular S1 S2 with no S3 and 2/6 HSM apex. R upper chest hickman site with milrinone infusing.  Lungs: clear Abdomen: soft, nontender, obese. No bruits or masses. Good bowel sounds.  Mild abdominal distention.  Extremities: no cyanosis, clubbing, rash, no lower extremity edema  Neuro: alert & orientedx3,  cranial nerves grossly intact. moves all 4 extremities w/o difficulty. Affect pleasant   EKG: NSR  87 bpm   ASSESSMENT & PLAN:   1) A/Chronic systolic HF: NICM, EF 03-50% (06/2013). St Jude ICD. He has been on home milrinone 0.375 mcg/kg/min since January 2014. NYHA III.   - Hypotensive with N/V likely the result of end stage HF. Dyspnea with exertion and this does not appear to be related to volume overload. Stop coreg due to hypotension.  Continue Inspra  And torsemide for now.  Check CO-OX today.   - No ACEI with  CKD  2) Atrial arrhythmias: Paroxysmal. Regular rhythm. On Eliquis. Stop BB for now due to hypotension.   3) CKD, stage IV: Recheck creatinine today.   4) Chronic pain: Per pain clinic.    Admit to telemetry today for end stage HF.  CLEGG,AMY NP-C  01/09/2014

## 2014-01-09 NOTE — Progress Notes (Signed)
Pt admitted to 3E25 via wheelchair from the Heart clinic. Pt is alert and oriented x4, able to follow commands and denies any pain at this time. Standard hospital orientation completed, callbell within reach, bed in lowest position. Will continue to monitor closely. Report received from [facility].

## 2014-01-09 NOTE — Progress Notes (Signed)
ANTIBIOTIC CONSULT NOTE - INITIAL  Pharmacy Consult:  Vancomycin / Zosyn Indication:  Sepsis / Bacteremia  Allergies  Allergen Reactions  . Nitroglycerin Other (See Comments)    "feels like head is going to bust open"  . Colchicine Nausea Only    Patient Measurements: Height: 6\' 3"  (190.5 cm) Weight: 242 lb 4.6 oz (109.9 kg) IBW/kg (Calculated) : 84.5  Vital Signs: Temp: 100.2 F (37.9 C) (01/08 1750) Temp Source: Oral (01/08 1750) BP: 126/72 mmHg (01/08 1750) Pulse Rate: 109 (01/08 1750) Intake/Output from this shift: Total I/O In: 480 [P.O.:480] Out: 275 [Urine:275]  Labs:  Recent Labs  01/09/14 1144  WBC 8.8  HGB 12.1*  PLT 146*  CREATININE 2.41*   Estimated Creatinine Clearance: 42 mL/min (by C-G formula based on Cr of 2.41). No results for input(s): VANCOTROUGH, VANCOPEAK, VANCORANDOM, GENTTROUGH, GENTPEAK, GENTRANDOM, TOBRATROUGH, TOBRAPEAK, TOBRARND, AMIKACINPEAK, AMIKACINTROU, AMIKACIN in the last 72 hours.   Microbiology: No results found for this or any previous visit (from the past 720 hour(s)).  Medical History: Past Medical History  Diagnosis Date  . CHF (congestive heart failure)   . Sarcoidosis   . Cardiomyopathy, dilated, nonischemic     non ischemic by cath  . Acute on chronic systolic heart failure   . Automatic implantable cardiac defibrillator in situ   . Atrial fibrillation   . NSVT (nonsustained ventricular tachycardia)   . GERD (gastroesophageal reflux disease)   . Hypercholesteremia   . Shortness of breath   . Chronic kidney disease (CKD), stage III (moderate)   . Pacemaker   . Anginal pain   . Gout   . Hypertension     dr [name]  . Coronary artery disease   . Elevated PSA 06/24/2013  . Myocardial infarction   . Headache   . Neuropathy       Assessment: 89 YOM admitted from HF clinic with increased dyspnea and fatigue.  Pharmacy consulted to initiate vancomycin and Zosyn for sepsis and bacteremia.  Patient with CKD  stage IV.   Goal of Therapy:  Vancomycin trough level 15-20 mcg/ml   Plan:  - Vanc 2gm IV x 1, then 1250mg  IV Q24H - Zosyn 3.375gm IV Q8H, 4 hr infusion - Monitor renal fxn, clinical progress, vanc trough at Css - Watch K+ (renal dysfunction on scheduled KCL)    Syrianna Schillaci D. Mina Marble, PharmD, BCPS Pager:  [phone] 01/09/2014, 7:05 PM

## 2014-01-09 NOTE — H&P (Addendum)
HPI: Mr. Downie is a 64 y.o. gentlemen with severe HF due to NICM (EF 20-25%) with multiple hospital admissions for HF exacerbations. He also has history of ventricular tachycardia s/p St Jude ICD by Dr. Lovena Le, CKD: stage IV, and paroxysmal atrial arrhythmias on amiodarone as well as sarcoidosis and hypertension. Cath in 2008 by Dr. Terrence Dupont showed no CAD.Seen at [facility] and not felt to be a transplant candidate due to renal failure and lack of family support.Milrinone initiated in January 2014 for low output. 03/01/12 milrinone increased 0.368mc/kg/min.  Today he presented to HF clinic for increased dyspnea and fatigue. Says he was throwing up all day on Tuesday. Po intake poor since that time. No Diarrhea. No f/c. No cough/dysuria. Says he has been dizzy for the last few days.Worse when walking.  Weight at home 239 pounds. He remains on Milrinone 0.375 mcg. Taking all medications. [facility] following for home milrinone. Complaining of fatigue. Ongoing dyspnea with exertion. Appetite ok.. Limiting fluid intake to less 2 liters. Having difficulty sleeping and coping with illness.     Review of Systems:    Cardiac Review of Systems: {Y] = yes [ ]  = no  Chest Pain [    ]  Resting SOB [   ] Exertional SOB  [ Y ]  Orthopnea [  ]   Pedal Edema [   ]    Palpitations [  ] Syncope  [  ]   Presyncope [   ]  General Review of Systems: [Y] = yes [  ]=no Constitional: recent weight change [  ]; anorexia [  ]; fatigue [ Y ]; nausea [  ]; night sweats [  ]; fever [  ]; or chills [  ];                                                                     Dental: poor dentition[  ];   Eye : blurred vision [  ]; diplopia [   ]; vision changes [  ];  Amaurosis fugax[  ]; Resp: cough [  ];  wheezing[  ];  hemoptysis[  ]; shortness of breath[Y  ]; paroxysmal nocturnal dyspnea[  ]; dyspnea on exertion[ Y ]; or orthopnea[  ];  GI:  gallstones[  ], vomiting[  ];  dysphagia[  ]; melena[  ];  hematochezia [  ]; heartburn[  ];     GU: kidney stones [  ]; hematuria[  ];   dysuria [  ];  nocturia[  ];               Skin: rash [  ], swelling[  ];, hair loss[  ];  peripheral edema[  ];  or itching[  ]; Musculosketetal: myalgias[  ];  joint swelling[  ];  joint erythema[  ];  joint pain[  ];  back pain[  ];  Heme/Lymph: bruising[  ];  bleeding[  ];  anemia[  ];  Neuro: TIA[  ];  headaches[  ];  stroke[  ];  vertigo[  ];  seizures[  ];   paresthesias[  ];  difficulty walking[  ];  Psych:depression[ Y ]; anxiety[Y  ];  Endocrine: diabetes[  ];  thyroid dysfunction[  ];  Other:  Past Medical History  Diagnosis Date  . CHF (congestive heart failure)   . Sarcoidosis   . Cardiomyopathy, dilated, nonischemic     non ischemic by cath  . Acute on chronic systolic heart failure   . Automatic implantable cardiac defibrillator in situ   . Atrial fibrillation   . NSVT (nonsustained ventricular tachycardia)   . GERD (gastroesophageal reflux disease)   . Hypercholesteremia   . Shortness of breath   . Chronic kidney disease (CKD), stage III (moderate)   . Pacemaker   . Anginal pain   . Gout   . Hypertension     dr [name]  . Coronary artery disease   . Elevated PSA 06/24/2013  . Myocardial infarction   . Headache   . Neuropathy     Prior to Admission medications   Medication Sig Start Date End Date Taking? Authorizing Provider  allopurinol (ZYLOPRIM) 100 MG tablet Take 1 tablet (100 mg total) by mouth daily. 02/05/12  Yes Wynetta Emery, PA-C  amiodarone (PACERONE) 400 MG tablet Take 400 mg by mouth 2 (two) times daily.   Yes Historical Provider, MD  apixaban (ELIQUIS) 5 MG TABS tablet Take 1 tablet (5 mg total) by mouth 2 (two) times daily. 10/27/13  Yes Larey Dresser, MD  Aspirin Effervescent (ALKA-SELTZER PO) Take 2 tablets by mouth 2 (two) times daily as needed (gas).   Yes Historical Provider, MD  diphenhydrAMINE (BENADRYL) 50 MG capsule Take 50 mg by mouth daily as needed for itching.   Yes Historical Provider,  MD  eplerenone (INSPRA) 25 MG tablet Take 12.5 mg by mouth every other day.  09/11/13  Yes Rande Brunt, NP  esomeprazole (NEXIUM) 40 MG capsule Take 40 mg by mouth 2 (two) times daily as needed (reflux).    Yes Historical Provider, MD  fluticasone (FLONASE) 50 MCG/ACT nasal spray Place 2 sprays into both nostrils daily as needed for allergies. 10/17/13  Yes Debbrah Alar, NP  Gabapentin Enacarbil ER (HORIZANT) 300 MG TBCR Take 300 mg by mouth at bedtime as needed (pain).    Yes Historical Provider, MD  hydrOXYzine (ATARAX/VISTARIL) 25 MG tablet Take 1 tablet (25 mg total) by mouth 2 (two) times daily as needed. 09/15/13  Yes Jolaine Artist, MD  ibuprofen (ADVIL,MOTRIN) 800 MG tablet Take 400 mg by mouth 2 (two) times daily.    Yes Historical Provider, MD  LORazepam (ATIVAN) 0.5 MG tablet Take 1 tablet (0.5 mg total) by mouth every 8 (eight) hours as needed for anxiety. Patient taking differently: Take 0.5-1 mg by mouth 2 (two) times daily as needed for anxiety.  12/10/13  Yes Kelvin Cellar, MD  metolazone (ZAROXOLYN) 2.5 MG tablet Take 1 tablet (2.5 mg total) by mouth daily as needed (for fluid retention). 08/27/13  Yes Shaune Pascal Lando Alcalde, MD  milrinone [facility]) 20 MG/100ML SOLN infusion Inject 0.375 mcg/kg/min into the vein continuous. 440mg  in 471ml infusion 2.5 ml per hour   Yes Historical Provider, MD  Oxycodone HCl 20 MG TABS Take 1 tablet (20 mg total) by mouth 3 (three) times daily as needed (pain.). Patient taking differently: Take 20 mg by mouth 3 (three) times daily as needed (pain.).  12/10/13  Yes Kelvin Cellar, MD  potassium chloride SA (K-DUR,KLOR-CON) 20 MEQ tablet Take 60 mEq by mouth 2 (two) times daily.  09/11/13  Yes Rande Brunt, NP  promethazine (PHENERGAN) 25 MG tablet Take 25 mg by mouth every 6 (six) hours as needed for nausea or vomiting.  Yes Historical Provider, MD  ranitidine (ZANTAC) 150 MG capsule Take 150 mg by mouth daily as needed for heartburn (reflux).    Yes Historical Provider, MD  senna-docusate (SENOKOT-S) 8.6-50 MG per tablet Take 1-2 tablets by mouth 2 (two) times daily as needed for mild constipation or moderate constipation. 10/21/13  Yes Debbrah Alar, NP  sucralfate (CARAFATE) 1 GM/10ML suspension Take 10 mLs (1 g total) by mouth 4 (four) times daily. Patient taking differently: Take 1 g by mouth 2 (two) times daily.  09/09/13  Yes Jessica D. Zehr, PA-C  torsemide (DEMADEX) 20 MG tablet Take 40-60 mg by mouth 2 (two) times daily. Takes three tablet in the morning (60mg ) and two tablet in the evening (40mg )   Yes Historical Provider, MD  zolpidem (AMBIEN) 5 MG tablet Take 1 tablet (5 mg total) by mouth at bedtime as needed for sleep. Patient taking differently: Take 7.5 mg by mouth at bedtime.  12/11/13  Yes Kelvin Cellar, MD  escitalopram (LEXAPRO) 10 MG tablet 1/2 tab by mouth once daily for 1 week, then increase to a full tab once daily Patient not taking: Reported on 01/09/2014 12/17/13   Debbrah Alar, NP  Magnesium Oxide 400 (240 MG) MG TABS Take 1 tablet by mouth 2 (two) times daily. Patient taking differently: Take 400 mg by mouth daily.  09/11/13   Rande Brunt, NP      Allergies  Allergen Reactions  . Nitroglycerin Other (See Comments)    "feels like head is going to bust open"  . Colchicine Nausea Only    History   Social History  . Marital Status: Single    Spouse Name: N/A    Number of Children: N/A  . Years of Education: N/A   Occupational History  . Not on file.   Social History Main Topics  . Smoking status: Never Smoker   . Smokeless tobacco: Never Used  . Alcohol Use: No  . Drug Use: Yes    Special: "Crack" cocaine, Cocaine     Comment: last use august 2004  . Sexual Activity: Not Currently   Other Topics Concern  . Not on file   Social History Narrative   3 sons- one in [place], 1 in [place], on in [place]   Retired from post office- on disability   Completed Bachelors from [employer]    Family  History  Problem Relation Age of Onset  . Heart disease    . Heart failure    . Stroke    . Anesthesia problems Neg Hx   . Hypotension Neg Hx   . Malignant hyperthermia Neg Hx   . Pseudochol deficiency Neg Hx     PHYSICAL EXAM: Filed Vitals:   01/09/14 1019  BP: 93/67  Pulse: 92  Temp: 97.7 F (36.5 C)  Resp: 20   PHYSICAL EXAM: General: NAD; Chronically ill appearing. Dypsnea walking in the clinic.  HEENT: normal  Neck: supple. JVP 5 cm. Carotids 2+ bilat; no bruits. No lymphadenopathy or thyromegaly appreciated.  Cor: PMI laterally displaced. Regular S1 S2 with n+ S3 S3 and 2/6 HSM apex. R upper chest hickman site with milrinone infusing.  Lungs: clear Abdomen: soft, nontender, obese. No bruits or masses. Good bowel sounds. Mild abdominal distention.  Extremities: no cyanosis, clubbing, rash, no lower extremity edema  Neuro: alert & orientedx3, cranial nerves grossly intact. moves all 4 extremities w/o difficulty. Affect pleasant    No results found for this or any previous visit (from  the past 24 hour(s)). No results found.   ASSESSMENT/PLAN/DISCUSSION:  1) Orthostatic hypotension 2) Chronic systolic HF: NICM, EF 16-10% (06/2013). St Jude ICD. He has been on home milrinone 0.375 mcg/kg/min since January 2014. NYHA III.  - Hypotensive in HF clinic this am  SBP 70s.  N/V likely the result of end stage HF. Dyspnea with exertion and this does not appear to be related to volume overload. Stop coreg due to hypotension. Continue Inspra Not on ace due to CKD. Not on nitrates due to viagra.  Check CO-OX today.  - No ACEI with CKD  3) Atrial arrhythmias: Paroxysmal. Regular rhythm. On Eliquis. Stop BB for now due to hypotension. Watch on tele 4) CKD, stage IV: Recheck creatinine today.  5) Chronic pain: Per pain clinic.  6) Hypokalemia   Admit to telemetry for observation. Check CO-OX.   CLEGG,AMY NP-C  11:02 AM   Patient seen and examined with Darrick Grinder,  NP. We discussed all aspects of the encounter. I agree with the assessment and plan as stated above.   He appears dry. Will hold diuretics and give 1L NS. Supp Kcl. Will check co-ox. Hopefully home soon.   Vonzell Lindblad,MD 1:47 PM

## 2014-01-09 NOTE — Progress Notes (Signed)
Pt having full body tremors, febrile and increased HR (140), loose stools x3, stool sample collected. Pt placed on Enteric precautions. MD aware, new orders received and implemented. Report regarding pt status given to night shift RN.

## 2014-01-09 NOTE — Progress Notes (Signed)
  Patient developed fever to 102 with chills. SBP 120s.  Will get bcx and UA. Start tylenol. Empiric vanc/zosyn for now. Follow closely. May need to move to SDU.   Benay Spice 6:38 PM

## 2014-01-09 NOTE — Care Management Note (Addendum)
    Page 1 of 2   01/15/2014     4:46:09 PM CARE MANAGEMENT NOTE 01/15/2014  Patient:  [name], [name]   Account Number:  [id number]  Date Initiated:  01/09/2014  Documentation initiated by:  Mariann Laster  Subjective/Objective Assessment:   HF d/t NICM, EF 20-25%     Action/Plan:   CM to follow for dispositon needs   Anticipated DC Date:  01/12/2014   Anticipated DC Plan:  [place]  CM consult  Medication Assistance      Choice offered to / List presented to:  NA        [place] arranged  HH-1 RN      [place].   Status of service:  Completed, signed off Medicare Important Message given?  YES (If response is "NO", the following Medicare IM given date fields will be blank) Date Medicare IM given:  01/15/2014 Medicare IM given by:  Kaelen Brennan Date Additional Medicare IM given:   Additional Medicare IM given by:    Discharge Disposition:  [place]  Per UR Regulation:  Reviewed for med. necessity/level of care/duration of stay  If discussed at Long Length of Stay Meetings, dates discussed:   01/15/2014    Comments:  Adolpho Meenach RN, BSN, MSHL, CCM  Nurse - Case Manager,  (Unit [facility]) [phone]  01/12/2014 HF d/t NICM, EF 20-25% Hx/o severe HF due to NICM (EF 20-25%), ventricular tachycardia s/p St Jude ICD, CKD: stage IV, and paroxysmal atrial arrhythmias on amiodarone,  sarcoidosis, HTN, CAD. Hx/o Seen at [facility] and not felt to be a transplant candidate due to renal failure and lack of family support. Hx/o 2 admissions and 3 ER visits over the past 6 months. Social:  Home with mother; has supportive girlfriend and church community. Coverage:  [facility] [place] [facility]: active Disposition Plan:  Home with HHS:  RN to resume home milrinone drip.  ([facility] notified) [place] IV nurse to bring pump/IV milrinone to floor to reconnect drip prior to dc home. Unit Floor nurse aware of plans. CM  will continue to follow for disposition needs

## 2014-01-10 DIAGNOSIS — E780 Pure hypercholesterolemia: Secondary | ICD-10-CM | POA: Diagnosis present

## 2014-01-10 DIAGNOSIS — R0600 Dyspnea, unspecified: Secondary | ICD-10-CM | POA: Diagnosis present

## 2014-01-10 DIAGNOSIS — I480 Paroxysmal atrial fibrillation: Secondary | ICD-10-CM | POA: Diagnosis present

## 2014-01-10 DIAGNOSIS — I129 Hypertensive chronic kidney disease with stage 1 through stage 4 chronic kidney disease, or unspecified chronic kidney disease: Secondary | ICD-10-CM | POA: Diagnosis present

## 2014-01-10 DIAGNOSIS — D869 Sarcoidosis, unspecified: Secondary | ICD-10-CM | POA: Diagnosis present

## 2014-01-10 DIAGNOSIS — I472 Ventricular tachycardia: Secondary | ICD-10-CM | POA: Diagnosis not present

## 2014-01-10 DIAGNOSIS — I420 Dilated cardiomyopathy: Secondary | ICD-10-CM | POA: Diagnosis present

## 2014-01-10 DIAGNOSIS — I951 Orthostatic hypotension: Secondary | ICD-10-CM | POA: Diagnosis not present

## 2014-01-10 DIAGNOSIS — R7881 Bacteremia: Secondary | ICD-10-CM | POA: Diagnosis not present

## 2014-01-10 DIAGNOSIS — E876 Hypokalemia: Secondary | ICD-10-CM | POA: Diagnosis not present

## 2014-01-10 DIAGNOSIS — M109 Gout, unspecified: Secondary | ICD-10-CM | POA: Diagnosis present

## 2014-01-10 DIAGNOSIS — I5022 Chronic systolic (congestive) heart failure: Secondary | ICD-10-CM | POA: Diagnosis not present

## 2014-01-10 DIAGNOSIS — G8929 Other chronic pain: Secondary | ICD-10-CM | POA: Diagnosis present

## 2014-01-10 DIAGNOSIS — R509 Fever, unspecified: Secondary | ICD-10-CM | POA: Diagnosis not present

## 2014-01-10 DIAGNOSIS — G629 Polyneuropathy, unspecified: Secondary | ICD-10-CM | POA: Diagnosis present

## 2014-01-10 DIAGNOSIS — A4150 Gram-negative sepsis, unspecified: Secondary | ICD-10-CM | POA: Diagnosis not present

## 2014-01-10 DIAGNOSIS — Z7982 Long term (current) use of aspirin: Secondary | ICD-10-CM | POA: Diagnosis not present

## 2014-01-10 DIAGNOSIS — I252 Old myocardial infarction: Secondary | ICD-10-CM | POA: Diagnosis not present

## 2014-01-10 DIAGNOSIS — Z452 Encounter for adjustment and management of vascular access device: Secondary | ICD-10-CM | POA: Diagnosis not present

## 2014-01-10 DIAGNOSIS — T80211A Bloodstream infection due to central venous catheter, initial encounter: Secondary | ICD-10-CM | POA: Diagnosis present

## 2014-01-10 DIAGNOSIS — N184 Chronic kidney disease, stage 4 (severe): Secondary | ICD-10-CM | POA: Diagnosis present

## 2014-01-10 DIAGNOSIS — I5021 Acute systolic (congestive) heart failure: Secondary | ICD-10-CM

## 2014-01-10 DIAGNOSIS — Z95810 Presence of automatic (implantable) cardiac defibrillator: Secondary | ICD-10-CM | POA: Diagnosis not present

## 2014-01-10 DIAGNOSIS — I059 Rheumatic mitral valve disease, unspecified: Secondary | ICD-10-CM | POA: Diagnosis not present

## 2014-01-10 DIAGNOSIS — I2510 Atherosclerotic heart disease of native coronary artery without angina pectoris: Secondary | ICD-10-CM | POA: Diagnosis present

## 2014-01-10 DIAGNOSIS — N183 Chronic kidney disease, stage 3 (moderate): Secondary | ICD-10-CM | POA: Diagnosis not present

## 2014-01-10 LAB — URINALYSIS, ROUTINE W REFLEX MICROSCOPIC
Glucose, UA: NEGATIVE mg/dL
HGB URINE DIPSTICK: NEGATIVE
Ketones, ur: 15 mg/dL — AB
Leukocytes, UA: NEGATIVE
NITRITE: NEGATIVE
PH: 6 (ref 5.0–8.0)
Protein, ur: 100 mg/dL — AB
Specific Gravity, Urine: 1.021 (ref 1.005–1.030)
Urobilinogen, UA: 2 mg/dL — ABNORMAL HIGH (ref 0.0–1.0)

## 2014-01-10 LAB — CLOSTRIDIUM DIFFICILE BY PCR: Toxigenic C. Difficile by PCR: NEGATIVE

## 2014-01-10 LAB — BASIC METABOLIC PANEL
ANION GAP: 4 — AB (ref 5–15)
BUN: 30 mg/dL — ABNORMAL HIGH (ref 6–23)
CO2: 25 mmol/L (ref 19–32)
Calcium: 8.6 mg/dL (ref 8.4–10.5)
Chloride: 104 mEq/L (ref 96–112)
Creatinine, Ser: 2.27 mg/dL — ABNORMAL HIGH (ref 0.50–1.35)
GFR calc Af Amer: 34 mL/min — ABNORMAL LOW (ref >90)
GFR, EST NON AFRICAN AMERICAN: 29 mL/min — AB (ref >90)
GLUCOSE: 107 mg/dL — AB (ref 70–99)
Potassium: 3.9 mmol/L (ref 3.5–5.1)
Sodium: 133 mmol/L — ABNORMAL LOW (ref 135–145)

## 2014-01-10 LAB — URINE MICROSCOPIC-ADD ON

## 2014-01-10 MED ORDER — POTASSIUM CHLORIDE CRYS ER 20 MEQ PO TBCR
40.0000 meq | EXTENDED_RELEASE_TABLET | Freq: Once | ORAL | Status: AC
  Administered 2014-01-10: 40 meq via ORAL
  Filled 2014-01-10: qty 2

## 2014-01-10 MED ORDER — AMIODARONE HCL IN DEXTROSE 360-4.14 MG/200ML-% IV SOLN
30.0000 mg/h | INTRAVENOUS | Status: AC
  Administered 2014-01-10 – 2014-01-12 (×4): 30 mg/h via INTRAVENOUS
  Filled 2014-01-10 (×10): qty 200

## 2014-01-10 MED ORDER — MAGNESIUM SULFATE 2 GM/50ML IV SOLN
2.0000 g | Freq: Once | INTRAVENOUS | Status: AC
  Administered 2014-01-10: 2 g via INTRAVENOUS
  Filled 2014-01-10: qty 50

## 2014-01-10 MED ORDER — AMIODARONE LOAD VIA INFUSION: 150.0000 mg | Freq: Once | INTRAVENOUS | Status: DC

## 2014-01-10 MED ORDER — AMIODARONE HCL IN DEXTROSE 360-4.14 MG/200ML-% IV SOLN
60.0000 mg/h | INTRAVENOUS | Status: AC
  Administered 2014-01-10: 60 mg/h via INTRAVENOUS
  Filled 2014-01-10 (×2): qty 200

## 2014-01-10 MED ORDER — AMIODARONE LOAD VIA INFUSION: 300.0000 mg | Freq: Once | INTRAVENOUS | Status: DC

## 2014-01-10 MED ORDER — AMIODARONE LOAD VIA INFUSION
150.0000 mg | Freq: Once | INTRAVENOUS | Status: AC
  Administered 2014-01-10: 150 mg via INTRAVENOUS
  Filled 2014-01-10: qty 83.34

## 2014-01-10 NOTE — Progress Notes (Signed)
Patient Name: [name] Date of Encounter: 01/10/2014     Active Problems:   Acute systolic CHF (congestive heart failure), NYHA class 3    SUBJECTIVE  Feeling terrible. Febrile again to 102. Now on vanc/zosyn. 4 loose stools. C. Diff PCR pending. Unable to explain why feeling so badly. Not complaining of SOB. No myalgias. Multiple runs NSVT up to 30 beats.  CURRENT MEDS . allopurinol  100 mg Oral Daily  . apixaban  5 mg Oral BID  . escitalopram  10 mg Oral Daily  . magnesium oxide  400 mg Oral BID  . pantoprazole  80 mg Oral Q1200  . piperacillin-tazobactam (ZOSYN)  IV  3.375 g Intravenous Q8H  . potassium chloride SA  60 mEq Oral BID  . sodium chloride  10-40 mL Intracatheter Q12H  . sodium chloride  3 mL Intravenous Q12H  . spironolactone  25 mg Oral Daily  . sucralfate  1 g Oral TID AC & HS  . vancomycin  1,250 mg Intravenous Q24H    OBJECTIVE  Filed Vitals:   01/09/14 1750 01/09/14 2138 01/10/14 0155 01/10/14 0531  BP: 126/72 129/64 128/72 103/70  Pulse: 109 98 96 79  Temp: 100.2 F (37.9 C) 100.1 F (37.8 C) 99.4 F (37.4 C) 100.4 F (38 C)  TempSrc: Oral Oral Oral Oral  Resp: 22 20 20 18   Height:      Weight:    239 lb 15.3 oz (108.842 kg)  SpO2: 94% 96% 98% 96%    Intake/Output Summary (Last 24 hours) at 01/10/14 0751 Last data filed at 01/10/14 0225  Gross per 24 hour  Intake 1324.17 ml  Output    625 ml  Net 699.17 ml   Filed Weights   01/09/14 1019 01/10/14 0531  Weight: 242 lb 4.6 oz (109.9 kg) 239 lb 15.3 oz (108.842 kg)    PHYSICAL EXAM  General: NAD; Chronically ill appearing.  HEENT: normal  Neck: supple. JVP 7 cm. Carotids 2+ bilat; no bruits. No lymphadenopathy or thyromegaly appreciated.  Cor: PMI laterally displaced. Regular S1 S2 with n+ S3 S3 and 2/6 HSM apex. R upper chest hickman site with milrinone infusing. No exudate or tenderness Lungs: clear Abdomen: soft, nontender, obese. No bruits or masses. Good bowel  sounds. Mild abdominal distention.  Extremities: no cyanosis, clubbing, rash, no lower extremity edema  Neuro: alert & orientedx3, cranial nerves grossly intact. moves all 4 extremities w/o difficulty.  Accessory Clinical Findings  CBC  Recent Labs  01/09/14 1144 01/09/14 2015  WBC 8.8 7.8  NEUTROABS 6.1  --   HGB 12.1* 12.4*  HCT 36.2* 36.8*  MCV 85.4 85.2  PLT 146* 301*   Basic Metabolic Panel  Recent Labs  01/09/14 1144 01/10/14 0537  NA 137 133*  K 3.1* 3.9  CL 103 104  CO2 28 25  GLUCOSE 131* 107*  BUN 38* 30*  CREATININE 2.41* 2.27*  CALCIUM 9.3 8.6  MG 2.2  --    Liver Function Tests  Recent Labs  01/09/14 1144  AST 35  ALT 23  ALKPHOS 72  BILITOT 1.2  PROT 7.0  ALBUMIN 3.2*    TELE  Runs of NSVT. Now resolved and in sinus tach  Radiology/Studies  No results found.  ASSESSMENT AND PLAN  [name] is a 64 y.o. gentlemen with severe HF due to NICM (EF 20-25%) with multiple hospital admissions for HF exacerbations. He also has history of ventricular tachycardia s/p St Jude ICD by Dr.  Lovena [name], CKD: stage IV, and paroxysmal atrial arrhythmias on amiodarone as well as sarcoidosis and hypertension. Cath in 2008 by Dr. Terrence [name] showed no CAD.Seen at [facility] and not felt to be a transplant candidate due to renal failure and lack of family support. Milrinone initiated in January 2014 for low output. 03/01/12 milrinone increased 0.321mc/kg/min. He was admitted from CHF clinic yesterday for dyspnea, fatigue and nausea.   NSVT- back to back runs of NSVT. ( longest almost 11 seconds). Patient symptomatic. He has an ICD in place but was not shocked. K 3.9 today. Mag 2.2 yesterday. -- Will give 2mg  Mag and IV amio load and infusion.  -- Will have St Jude device interrogated.  -- BB was discontinued 01/09/14 due to hypotension. BP better, consider adding back at lower dose. (previously 6.25mg  BID) -- Consider transferring to Stepdown.  Fevers/Rigors- Temp up to  102.4 today per nursing. Patient feeling "terrible."  -- Continue Vanc/Zosyn and tylenol.  -- Cdiff PCR in process.  Chronic systolic HF: NICM, EF 78-46% (06/2013). St Jude ICD. He has been on home milrinone 0.375 mcg/kg/min since January 2014. NYHA III.  -- Hypotensive in HF clinic SBP 70s. N/V likely the result of end stage HF. -- Dyspnea with exertion and this does not appear to be related to volume overload.  -- coreg discontinued due to hypotension. Continue Inspra -- Not on ace due to CKD. Not on nitrates due to viagra.  -- He appeared dry yesterday and held diuretics and given 1 L NS. +499 L. Weight down 3 lbs.   Atrial arrhythmias: Paroxysmal. Regular rhythm today. On Eliquis.  -- BB discontinued 01/09/14 due to hypotension.   CKD, stage IV:  Creatinine slightly improved today. 2.41--> 2.27   Hypokalemia- K 3.9. Continue to monitor   Hypotension- Hypotensive in HF clinic this am SBP 70s and yesterday. BPs much improved today. Last BP 103/70.  Darrell Pimple PA-C  Pager [phone]  Patient seen and examined with Darrell Range, PA-C. We discussed all aspects of the encounter. I agree with the assessment and plan as stated above.  Source of fevers unclear. On vanc and zosyn to cover potential PICC bacteremia. Cultures pending. Having loose stools. C. Diff pending. Symptoms not clearly suggestive of influenza. No sick contacts. Continue to hold diuretics. He had multiple runs NSVT/VT. Start amio. STJ to interrogate device this am.   [name] 9:36 AM

## 2014-01-10 NOTE — Progress Notes (Signed)
Late Entry: Called by nursing to evaluate patient due to multiple runs of v tach/v fib.  Upon my arrival to patients room, Rn at bedside attempting to obtain EKG.  Patient with full body tremors.  Patient is lethargic and oxygen.  PA at bedside.  No assistance needed at this time.  Rn to call if assistance needed.

## 2014-01-10 NOTE — Progress Notes (Signed)
The patient had an uneventful night until shift change this morning.  His temperature was 100.4 at 0530 and he received Tylenol at that time.  Around 0650, the patient began to shiver uncontrollably.  He was given additional warm blankets.  He then had multiple runs of V fib/V tach very close to one another.  The patient became lethargic and stated that he felt the V tach/V fib.  VS were taken and are charted.  His temperature was 102.6 and his O2 was 91-92% on 2L of O2.  His O2 was turned up to 3L.  An EKG was taken.  Rapid response was called and the PA was also called.  Both came to bedside to assess the patient.

## 2014-01-10 NOTE — Progress Notes (Signed)
Pt begun on Amiodarone drip earlier with no further runs VT, Temp down to 98.4. Pt up in chair, feeling somewhat better

## 2014-01-10 NOTE — Addendum Note (Signed)
Encounter addended by: Vanessa Barbara, CCT on: 01/10/2014  1:15 PM<BR>     Documentation filed: Charges VN

## 2014-01-10 NOTE — Progress Notes (Signed)
UR completed 

## 2014-01-11 ENCOUNTER — Inpatient Hospital Stay (HOSPITAL_COMMUNITY): Payer: Medicare Other

## 2014-01-11 DIAGNOSIS — R7881 Bacteremia: Secondary | ICD-10-CM

## 2014-01-11 DIAGNOSIS — A4150 Gram-negative sepsis, unspecified: Secondary | ICD-10-CM

## 2014-01-11 LAB — BASIC METABOLIC PANEL
Anion gap: 6 (ref 5–15)
BUN: 19 mg/dL (ref 6–23)
CO2: 26 mmol/L (ref 19–32)
Calcium: 9.1 mg/dL (ref 8.4–10.5)
Chloride: 104 mEq/L (ref 96–112)
Creatinine, Ser: 1.84 mg/dL — ABNORMAL HIGH (ref 0.50–1.35)
GFR calc non Af Amer: 37 mL/min — ABNORMAL LOW (ref >90)
GFR, EST AFRICAN AMERICAN: 43 mL/min — AB (ref >90)
GLUCOSE: 108 mg/dL — AB (ref 70–99)
POTASSIUM: 3.8 mmol/L (ref 3.5–5.1)
Sodium: 136 mmol/L (ref 135–145)

## 2014-01-11 LAB — MAGNESIUM: Magnesium: 2.1 mg/dL (ref 1.5–2.5)

## 2014-01-11 MED ORDER — CARVEDILOL 3.125 MG PO TABS
3.1250 mg | ORAL_TABLET | Freq: Two times a day (BID) | ORAL | Status: DC
  Administered 2014-01-11 – 2014-01-15 (×10): 3.125 mg via ORAL
  Filled 2014-01-11 (×11): qty 1

## 2014-01-11 MED ORDER — CHLORHEXIDINE GLUCONATE 4 % EX LIQD
CUTANEOUS | Status: AC
  Filled 2014-01-11: qty 15

## 2014-01-11 MED ORDER — OXYCODONE HCL 5 MG PO TABS
10.0000 mg | ORAL_TABLET | Freq: Three times a day (TID) | ORAL | Status: DC | PRN
  Administered 2014-01-11 – 2014-01-14 (×4): 10 mg via ORAL
  Filled 2014-01-11 (×4): qty 2

## 2014-01-11 MED ORDER — LIDOCAINE HCL 1 % IJ SOLN
INTRAMUSCULAR | Status: AC
  Filled 2014-01-11: qty 20

## 2014-01-11 MED ORDER — POTASSIUM CHLORIDE ER 10 MEQ PO TBCR
20.0000 meq | EXTENDED_RELEASE_TABLET | Freq: Every day | ORAL | Status: DC
  Administered 2014-01-11 – 2014-01-15 (×5): 20 meq via ORAL
  Filled 2014-01-11 (×5): qty 2

## 2014-01-11 MED ORDER — TORSEMIDE 20 MG PO TABS
40.0000 mg | ORAL_TABLET | Freq: Every day | ORAL | Status: DC
  Administered 2014-01-12 – 2014-01-13 (×2): 40 mg via ORAL
  Filled 2014-01-11 (×4): qty 2

## 2014-01-11 NOTE — Progress Notes (Signed)
The patient did not have any acute events or complaints of pain overnight.  He received Ambien once and Lorazepam once.  His blood pressure is remaining in the mid-upper 90s/60.

## 2014-01-11 NOTE — Procedures (Signed)
Successful removal of tunneled PICC.  No immediate complications.

## 2014-01-11 NOTE — Progress Notes (Signed)
Patient Name: [name] Date of Encounter: 01/11/2014     Active Problems:   Acute systolic CHF (congestive heart failure), NYHA class 3    SUBJECTIVE  Feels much better. No fevers. No more VT. On amio/vanc/zosyn. Bcx fro PICC growing GNR   CURRENT MEDS . allopurinol  100 mg Oral Daily  . apixaban  5 mg Oral BID  . escitalopram  10 mg Oral Daily  . magnesium oxide  400 mg Oral BID  . pantoprazole  80 mg Oral Q1200  . piperacillin-tazobactam (ZOSYN)  IV  3.375 g Intravenous Q8H  . sodium chloride  10-40 mL Intracatheter Q12H  . sodium chloride  3 mL Intravenous Q12H  . spironolactone  25 mg Oral Daily  . sucralfate  1 g Oral TID AC & HS  . vancomycin  1,250 mg Intravenous Q24H    OBJECTIVE  Filed Vitals:   01/10/14 1700 01/10/14 2018 01/11/14 0135 01/11/14 0521  BP: 95/63 105/73 93/60 96/54   Pulse: 70 68 67 65  Temp: 98 F (36.7 C) 97.7 F (36.5 C) 98.3 F (36.8 C) 97.9 F (36.6 C)  TempSrc: Oral Oral Oral Oral  Resp: 18 18 16 20   Height:      Weight:    111.222 kg (245 lb 3.2 oz)  SpO2: 92% 93% 93% 94%    Intake/Output Summary (Last 24 hours) at 01/11/14 0913 Last data filed at 01/11/14 0700  Gross per 24 hour  Intake   2518 ml  Output   1700 ml  Net    818 ml   Filed Weights   01/09/14 1019 01/10/14 0531 01/11/14 0521  Weight: 109.9 kg (242 lb 4.6 oz) 108.842 kg (239 lb 15.3 oz) 111.222 kg (245 lb 3.2 oz)    PHYSICAL EXAM  General: NAD; lying flat in bed NAD HEENT: normal  Neck: supple. JVP 8 cm. Carotids 2+ bilat; no bruits. No lymphadenopathy or thyromegaly appreciated.  Cor: PMI laterally displaced. Regular S1 S2 with n+ S3 S3 and 2/6 HSM apex. R upper chest PICC site with milrinone infusing. No exudate or tenderness Lungs: clear Abdomen: soft, nontender, obese. No bruits or masses. Good bowel sounds. Mild abdominal distention.  Extremities: no cyanosis, clubbing, rash, no lower extremity edema  Neuro: alert & orientedx3, cranial  nerves grossly intact. moves all 4 extremities w/o difficulty.  Accessory Clinical Findings  CBC  Recent Labs  01/09/14 1144 01/09/14 2015  WBC 8.8 7.8  NEUTROABS 6.1  --   HGB 12.1* 12.4*  HCT 36.2* 36.8*  MCV 85.4 85.2  PLT 146* 614*   Basic Metabolic Panel  Recent Labs  01/09/14 1144 01/10/14 0537 01/11/14 0508  NA 137 133* 136  K 3.1* 3.9 3.8  CL 103 104 104  CO2 28 25 26   GLUCOSE 131* 107* 108*  BUN 38* 30* 19  CREATININE 2.41* 2.27* 1.84*  CALCIUM 9.3 8.6 9.1  MG 2.2  --  2.1   Liver Function Tests  Recent Labs  01/09/14 1144  AST 35  ALT 23  ALKPHOS 72  BILITOT 1.2  PROT 7.0  ALBUMIN 3.2*    TELE  Runs of NSVT. Now resolved and in sinus tach  Radiology/Studies  No results found.  ASSESSMENT AND PLAN  [name] is a 64 y.o. gentlemen with severe HF due to NICM (EF 20-25%) with multiple hospital admissions for HF exacerbations. He also has history of ventricular tachycardia s/p St Jude ICD by Dr. Lovena Le, CKD: stage IV, and paroxysmal  atrial arrhythmias on amiodarone as well as sarcoidosis and hypertension. Cath in 2008 by Dr. Terrence Dupont showed no CAD.Seen at [facility] and not felt to be a transplant candidate due to renal failure and lack of family support. Milrinone initiated in January 2014 for low output. 03/01/12 milrinone increased 0.344mc/kg/min. He was admitted from CHF clinic yesterday for dyspnea, fatigue and nausea.    1) VT/VF - amio started this admit 2) GNR bacteremia 3) Chronic systolic HF: NICM, EF 23-95% (06/2013). St Jude ICD. He has been on home milrinone 0.375 mcg/kg/min since January 2014. NYHA III.  4) Atrial arrhythmias: Paroxysmal. Regular rhythm today. On Eliquis.  -- BB discontinued 01/09/14 due to hypotension.  5) CKD, stage IV:  Creatinine slightly improved today. 6) Hypokalemia- K 3.9. Continue to monitor    Blood culture from PICC with GNR. Continue Zosyn. Can stop vanc. Will ask IR to pull PICC. Await speciation of bug.  Will likely need TEE to look at ICD.   Continue amio for VT/VF. No further events on tele. CHF stable. Can start back low-dose b-blocker. Continue to hold diuretics. Will resume in am.    Benay Spice 9:13 AM

## 2014-01-11 NOTE — Progress Notes (Signed)
Picc line removed by DR Pascal Lux IR at bedside without complications. Iv medications transferred to PIV

## 2014-01-11 NOTE — Progress Notes (Signed)
The patient's blood culture from the PICC line came back positive.  Dr. Oval Linsey was notified.

## 2014-01-12 DIAGNOSIS — I059 Rheumatic mitral valve disease, unspecified: Secondary | ICD-10-CM

## 2014-01-12 LAB — BASIC METABOLIC PANEL
Anion gap: 3 — ABNORMAL LOW (ref 5–15)
BUN: 20 mg/dL (ref 6–23)
CALCIUM: 9.1 mg/dL (ref 8.4–10.5)
CO2: 22 mmol/L (ref 19–32)
CREATININE: 1.91 mg/dL — AB (ref 0.50–1.35)
Chloride: 110 mEq/L (ref 96–112)
GFR calc Af Amer: 41 mL/min — ABNORMAL LOW (ref >90)
GFR, EST NON AFRICAN AMERICAN: 35 mL/min — AB (ref >90)
Glucose, Bld: 107 mg/dL — ABNORMAL HIGH (ref 70–99)
Potassium: 4.1 mmol/L (ref 3.5–5.1)
Sodium: 135 mmol/L (ref 135–145)

## 2014-01-12 LAB — CULTURE, BLOOD (SINGLE)

## 2014-01-12 MED ORDER — AMIODARONE HCL 200 MG PO TABS
200.0000 mg | ORAL_TABLET | Freq: Two times a day (BID) | ORAL | Status: DC
  Administered 2014-01-12 – 2014-01-15 (×7): 200 mg via ORAL
  Filled 2014-01-12 (×8): qty 1

## 2014-01-12 MED ORDER — CEFTRIAXONE SODIUM IN DEXTROSE 40 MG/ML IV SOLN
2.0000 g | INTRAVENOUS | Status: DC
  Administered 2014-01-13 – 2014-01-15 (×3): 2 g via INTRAVENOUS
  Filled 2014-01-12 (×3): qty 50

## 2014-01-12 MED ORDER — CEFTRIAXONE SODIUM IN DEXTROSE 20 MG/ML IV SOLN
1.0000 g | INTRAVENOUS | Status: DC
  Administered 2014-01-12: 1 g via INTRAVENOUS
  Filled 2014-01-12: qty 50

## 2014-01-12 NOTE — Progress Notes (Signed)
The patient wanted 10 mg of Oxy IR instead of 20 mg.  The MD was notified.  The order was rewritten for 10 mg of Oxy IR.

## 2014-01-12 NOTE — Progress Notes (Signed)
  Echocardiogram 2D Echocardiogram has been performed.  [name] 01/12/2014, 5:52 PM

## 2014-01-12 NOTE — Progress Notes (Signed)
Patient Name: [name] Date of Encounter: 01/12/2014     Active Problems:   Chronic systolic CHF (congestive heart failure), NYHA class 3-4   Paroxysmal VT   Hypokalemia   Chronic systolic heart failure   Acute systolic CHF (congestive heart failure), NYHA class 3   Gram-negative bacteremia    SUBJECTIVE  Feels much better. Denies SOB. Having ongoing diarrhea but slowing down.  Afebrile.  No more VT.   Bcx from PICC growing GNR. Enterobac Cloace   CURRENT MEDS . allopurinol  100 mg Oral Daily  . apixaban  5 mg Oral BID  . carvedilol  3.125 mg Oral BID WC  . escitalopram  10 mg Oral Daily  . magnesium oxide  400 mg Oral BID  . pantoprazole  80 mg Oral Q1200  . piperacillin-tazobactam (ZOSYN)  IV  3.375 g Intravenous Q8H  . potassium chloride  20 mEq Oral Daily  . sodium chloride  10-40 mL Intracatheter Q12H  . sodium chloride  3 mL Intravenous Q12H  . spironolactone  25 mg Oral Daily  . sucralfate  1 g Oral TID AC & HS  . torsemide  40 mg Oral Daily    OBJECTIVE  Filed Vitals:   01/11/14 1936 01/11/14 1945 01/12/14 0136 01/12/14 0508  BP:  118/71 110/76 104/81  Pulse:  68 66 72  Temp: 97.5 F (36.4 C) 97.3 F (36.3 C) 97.7 F (36.5 C) 98.7 F (37.1 C)  TempSrc:  Oral Oral Oral  Resp:  18 18 16   Height:      Weight:    245 lb 9.5 oz (111.4 kg)  SpO2:  98% 91% 97%    Intake/Output Summary (Last 24 hours) at 01/12/14 0855 Last data filed at 01/12/14 0610  Gross per 24 hour  Intake 1641.76 ml  Output    600 ml  Net 1041.76 ml   Filed Weights   01/10/14 0531 01/11/14 0521 01/12/14 0508  Weight: 239 lb 15.3 oz (108.842 kg) 245 lb 3.2 oz (111.222 kg) 245 lb 9.5 oz (111.4 kg)    PHYSICAL EXAM  General: NAD; lying flat in bed NAD HEENT: normal  Neck: supple. JVP 7-8 cm. Carotids 2+ bilat; no bruits. No lymphadenopathy or thyromegaly appreciated.  Cor: PMI laterally displaced. Regular S1 S2 with + S3 and 2/6 HSM apex. R upper chest PICC site  with milrinone infusing. No exudate or tenderness Lungs: clear Abdomen: soft, nontender, obese. No bruits or masses. Good bowel sounds. Mild abdominal distention.  Extremities: no cyanosis, clubbing, rash, no lower extremity edema  Neuro: alert & orientedx3, cranial nerves grossly intact. moves all 4 extremities w/o difficulty.  Accessory Clinical Findings  CBC  Recent Labs  01/09/14 1144 01/09/14 2015  WBC 8.8 7.8  NEUTROABS 6.1  --   HGB 12.1* 12.4*  HCT 36.2* 36.8*  MCV 85.4 85.2  PLT 146* 295*   Basic Metabolic Panel  Recent Labs  01/09/14 1144  01/11/14 0508 01/12/14 0307  NA 137  < > 136 135  K 3.1*  < > 3.8 4.1  CL 103  < > 104 110  CO2 28  < > 26 22  GLUCOSE 131*  < > 108* 107*  BUN 38*  < > 19 20  CREATININE 2.41*  < > 1.84* 1.91*  CALCIUM 9.3  < > 9.1 9.1  MG 2.2  --  2.1  --   < > = values in this interval not displayed. Liver Function Tests  Recent  Labs  01/09/14 1144  AST 35  ALT 23  ALKPHOS 72  BILITOT 1.2  PROT 7.0  ALBUMIN 3.2*    TELE  Runs of NSVT. Now resolved and in sinus tach  Radiology/Studies  Ir Removal Tun Cv Cath W/o Fl  01/11/2014   INDICATION: History of chronic central venous access for continuous medication infusion. Concern for catheter infection.  EXAM: REMOVAL OF TUNNELED PICC  COMPARISON:  Fluoroscopic guided tunneled PICC line catheter exchange - 10/28/2013  MEDICATIONS: None  COMPLICATIONS: None immediate  PROCEDURE: Maximal barrier sterile technique was utilized including caps, mask, sterile gowns, sterile gloves, large sterile drape, hand hygiene, and Betadine.  1% lidocaine with epinephrine was injected under sterile conditions along the subcutaneous tunnel. Utilizing a combination of blunt dissection and gentle traction, the catheter was removed intact. Hemostasis was obtained with manual compression. A dressing was placed. The patient tolerated the procedure well without immediate post procedural complication.   IMPRESSION: Successful removal of tunneled PICC line.   Electronically Signed   By: Sandi Mariscal M.D.   On: 01/11/2014 11:53    ASSESSMENT AND PLAN  Mr. Cessna is a 64 y.o. gentlemen with severe HF due to NICM (EF 20-25%) with multiple hospital admissions for HF exacerbations. He also has history of ventricular tachycardia s/p St Jude ICD by Dr. Lovena Le, CKD: stage IV, and paroxysmal atrial arrhythmias on amiodarone as well as sarcoidosis and hypertension. Cath in 2008 by Dr. Terrence Dupont showed no CAD.Seen at [facility] and not felt to be a transplant candidate due to renal failure and lack of family support. Milrinone initiated in January 2014 for low output. 03/01/12 milrinone increased 0.333mc/kg/min. He was admitted from CHF clinic with dyspnea, fatigue and nausea.    1) VT/VF - Stop IV amio and switch to po amiodarone.  2) GNR bacteremia-PICC with GNR. PICC out. Cultures showed enterobacter cloace. Narrow antibiotics to rocephin. Repeat blood cultures today. May need TEE.  3) Chronic systolic HF: NICM, EF 09-23% (06/2013). St Jude ICD. He has been on home milrinone 0.375 mcg/kg/min since January 2014. NYHA III. Weight unchanged. Hold diuretics for now.  4) Atrial arrhythmias: Paroxysmal. Regular rhythm today. On Eliquis.  -- BB discontinued 01/09/14 due to hypotension.  5) CKD, stage IV:  Creatinine down to 1.9.  6) Hypokalemia- K 4.1 . Continue to monitor     CLEGG,AMY, NP-C  8:55 AM   Patient seen and examined with Darrick Grinder, NP. We discussed all aspects of the encounter. I agree with the assessment and plan as stated above.   Much improved. Diarrhea slowing down. C. Diff negative. No longer febrile. Bcx from PICC + enterobacter. PICC out. Will swich to rocephin. Low predilection for endocarditis. Will check TTE to screen. Check surveillance cx. PICC back in when surveillance cx negative. Resume diuretics tomorrow. Continue amio for VT/VF  Benay Spice 9:17 AM

## 2014-01-13 LAB — BASIC METABOLIC PANEL
Anion gap: 5 (ref 5–15)
BUN: 19 mg/dL (ref 6–23)
CHLORIDE: 105 meq/L (ref 96–112)
CO2: 23 mmol/L (ref 19–32)
Calcium: 9 mg/dL (ref 8.4–10.5)
Creatinine, Ser: 2.12 mg/dL — ABNORMAL HIGH (ref 0.50–1.35)
GFR, EST AFRICAN AMERICAN: 36 mL/min — AB (ref >90)
GFR, EST NON AFRICAN AMERICAN: 31 mL/min — AB (ref >90)
GLUCOSE: 122 mg/dL — AB (ref 70–99)
POTASSIUM: 3.6 mmol/L (ref 3.5–5.1)
Sodium: 133 mmol/L — ABNORMAL LOW (ref 135–145)

## 2014-01-13 MED ORDER — POTASSIUM CHLORIDE CRYS ER 20 MEQ PO TBCR
20.0000 meq | EXTENDED_RELEASE_TABLET | Freq: Once | ORAL | Status: AC
  Administered 2014-01-13: 20 meq via ORAL
  Filled 2014-01-13: qty 1

## 2014-01-13 NOTE — Progress Notes (Signed)
UR completed Ladavia Lindenbaum K. Osmel Dykstra, RN, BSN, MSHL, CCM  01/13/2014 12:26 PM

## 2014-01-13 NOTE — Progress Notes (Signed)
Patient Name: [name] Date of Encounter: 01/13/2014     Active Problems:   Chronic systolic CHF (congestive heart failure), NYHA class 3-4   Paroxysmal VT   Hypokalemia   Chronic systolic heart failure   Acute systolic CHF (congestive heart failure), NYHA class 3   Gram-negative bacteremia    SUBJECTIVE  Yesterday he was started back on torsemide. Weight down 4 pounds. Afebrile. No VT. No further diarrhea.   Bcx from PICC growing GNR. Enterobac Cloace. Repeat cultures drawn 01/12/14   CURRENT MEDS . allopurinol  100 mg Oral Daily  . amiodarone  200 mg Oral BID  . apixaban  5 mg Oral BID  . carvedilol  3.125 mg Oral BID WC  . cefTRIAXone (ROCEPHIN)  IV  2 g Intravenous Q24H  . escitalopram  10 mg Oral Daily  . magnesium oxide  400 mg Oral BID  . pantoprazole  80 mg Oral Q1200  . potassium chloride  20 mEq Oral Daily  . sodium chloride  10-40 mL Intracatheter Q12H  . sodium chloride  3 mL Intravenous Q12H  . spironolactone  25 mg Oral Daily  . sucralfate  1 g Oral TID AC & HS  . torsemide  40 mg Oral Daily    OBJECTIVE  Filed Vitals:   01/12/14 2144 01/12/14 2326 01/13/14 0119 01/13/14 0614  BP: 111/71 104/76 113/67 104/73  Pulse: 70 70 77 72  Temp: 98.5 F (36.9 C)  97.6 F (36.4 C)   TempSrc: Oral  Oral   Resp: 18  18   Height:      Weight:      SpO2: 97%  100%     Intake/Output Summary (Last 24 hours) at 01/13/14 0700 Last data filed at 01/13/14 0616  Gross per 24 hour  Intake 1439.55 ml  Output   2750 ml  Net -1310.45 ml   Filed Weights   01/10/14 0531 01/11/14 0521 01/12/14 0508  Weight: 239 lb 15.3 oz (108.842 kg) 245 lb 3.2 oz (111.222 kg) 245 lb 9.5 oz (111.4 kg)    PHYSICAL EXAM  General: NAD; lying flat in bed NAD HEENT: normal  Neck: supple. JVP 5-6 cm. Carotids 2+ bilat; no bruits. No lymphadenopathy or thyromegaly appreciated.  Cor: PMI laterally displaced. Regular S1 S2 with + S3 and 2/6 HSM apex. R upper chest Lungs:  clear Abdomen: soft, nontender, obese. No bruits or masses. Good bowel sounds. Mild abdominal distention.  Extremities: no cyanosis, clubbing, rash, no lower extremity edema  Neuro: alert & orientedx3, cranial nerves grossly intact. moves all 4 extremities w/o difficulty.  Accessory Clinical Findings  CBC No results for input(s): WBC, NEUTROABS, HGB, HCT, MCV, PLT in the last 72 hours. Basic Metabolic Panel  Recent Labs  01/11/14 0508 01/12/14 0307  NA 136 135  K 3.8 4.1  CL 104 110  CO2 26 22  GLUCOSE 108* 107*  BUN 19 20  CREATININE 1.84* 1.91*  CALCIUM 9.1 9.1  MG 2.1  --    Liver Function Tests No results for input(s): AST, ALT, ALKPHOS, BILITOT, PROT, ALBUMIN in the last 72 hours.   Radiology/Studies  Ir Removal Tun Cv Cath W/o Fl  01/11/2014   INDICATION: History of chronic central venous access for continuous medication infusion. Concern for catheter infection.  EXAM: REMOVAL OF TUNNELED PICC  COMPARISON:  Fluoroscopic guided tunneled PICC line catheter exchange - 10/28/2013  MEDICATIONS: None  COMPLICATIONS: None immediate  PROCEDURE: Maximal barrier sterile technique was utilized including  caps, mask, sterile gowns, sterile gloves, large sterile drape, hand hygiene, and Betadine.  1% lidocaine with epinephrine was injected under sterile conditions along the subcutaneous tunnel. Utilizing a combination of blunt dissection and gentle traction, the catheter was removed intact. Hemostasis was obtained with manual compression. A dressing was placed. The patient tolerated the procedure well without immediate post procedural complication.  IMPRESSION: Successful removal of tunneled PICC line.   Electronically Signed   By: Sandi Mariscal M.D.   On: 01/11/2014 11:53    ASSESSMENT AND PLAN  Mr. Baxendale is a 64 y.o. gentlemen with severe HF due to NICM (EF 20-25%) with multiple hospital admissions for HF exacerbations. He also has history of ventricular tachycardia s/p St Jude ICD  by Dr. Lovena Le, CKD: stage IV, and paroxysmal atrial arrhythmias on amiodarone as well as sarcoidosis and hypertension. Cath in 2008 by Dr. Terrence Dupont showed no CAD.Seen at [facility] and not felt to be a transplant candidate due to renal failure and lack of family support. Milrinone initiated in January 2014 for low output. 03/01/12 milrinone increased 0.324mc/kg/min. He was admitted from CHF clinic with dyspnea, fatigue and nausea.    1) VT/VF - Stop IV amio and switch to po amiodarone.  2) GNR bacteremia-PICC with GNR. PICC out. Cultures showed enterobacter cloace. Narrow antibiotics to rocephin. Repeat blood cultures results pending.   3) Chronic systolic HF: NICM, EF 70-17% (06/2013). St Jude ICD. He has been on home milrinone 0.375 mcg/kg/min since January 2014. NYHA III. ECHO completed. Weight down 4 pounds. Continue torsemide 40 mg daily. Labs pending.  4) Atrial arrhythmias: Paroxysmal. Regular rhythm today. On Eliquis.  -- BB discontinued 01/09/14 due to hypotension.  5) CKD, stage IV:  Labs pending. Marland Kitchen  6) Hypokalemia- Labs pending. K 4.1 . Continue to monitor     CLEGG,AMY, NP-C  7:00 AM   Patient seen and examined with Darrick Grinder, NP. We discussed all aspects of the encounter. I agree with the assessment and plan as stated above. He is back to baseline. Surveillance cx drawn and still negative. Once negative x 48 hours will replace PICC for home milrinone. Diarrhea resolved. Volume status ok on demadex 40 daily. Will continue for now. Enterobacter low predilection for endocarditis so will hold off on TEE. Check TTE. Continue amio for VT/VF.   Daniel Bensimhon,MD 7:26 AM

## 2014-01-14 LAB — BASIC METABOLIC PANEL
ANION GAP: 8 (ref 5–15)
BUN: 26 mg/dL — ABNORMAL HIGH (ref 6–23)
CALCIUM: 9.4 mg/dL (ref 8.4–10.5)
CO2: 24 mmol/L (ref 19–32)
CREATININE: 2.45 mg/dL — AB (ref 0.50–1.35)
Chloride: 102 mEq/L (ref 96–112)
GFR, EST AFRICAN AMERICAN: 30 mL/min — AB (ref >90)
GFR, EST NON AFRICAN AMERICAN: 26 mL/min — AB (ref >90)
Glucose, Bld: 94 mg/dL (ref 70–99)
Potassium: 4.1 mmol/L (ref 3.5–5.1)
Sodium: 134 mmol/L — ABNORMAL LOW (ref 135–145)

## 2014-01-14 NOTE — Progress Notes (Signed)
Patient Name: [name] Date of Encounter: 01/14/2014     Active Problems:   Chronic systolic CHF (congestive heart failure), NYHA class 3-4   Paroxysmal VT   Hypokalemia   Chronic systolic heart failure   Acute systolic CHF (congestive heart failure), NYHA class 3   Gram-negative bacteremia    SUBJECTIVE  Yesterday he was started back on torsemide. Feels fine. No further VT. Echo with EF 20-25%. RV ok.   Bcx from PICC growing GNR. Enterobac Cloace. Repeat cultures drawn 01/12/14. Remain negative.   CURRENT MEDS . allopurinol  100 mg Oral Daily  . amiodarone  200 mg Oral BID  . apixaban  5 mg Oral BID  . carvedilol  3.125 mg Oral BID WC  . cefTRIAXone (ROCEPHIN)  IV  2 g Intravenous Q24H  . escitalopram  10 mg Oral Daily  . magnesium oxide  400 mg Oral BID  . pantoprazole  80 mg Oral Q1200  . potassium chloride  20 mEq Oral Daily  . sodium chloride  10-40 mL Intracatheter Q12H  . sodium chloride  3 mL Intravenous Q12H  . spironolactone  25 mg Oral Daily  . sucralfate  1 g Oral TID AC & HS    OBJECTIVE  Filed Vitals:   01/13/14 2148 01/14/14 0135 01/14/14 0526 01/14/14 0900  BP: 97/67 101/74 98/73 101/75  Pulse: 72 85 72 83  Temp: 98 F (36.7 C) 97.9 F (36.6 C) 98 F (36.7 C) 97.4 F (36.3 C)  TempSrc: Oral Oral Oral Oral  Resp: 18 18 18 18   Height:      Weight:   108.183 kg (238 lb 8 oz)   SpO2: 94% 95% 95% 98%    Intake/Output Summary (Last 24 hours) at 01/14/14 1244 Last data filed at 01/14/14 1000  Gross per 24 hour  Intake 1242.71 ml  Output   1775 ml  Net -532.29 ml   Filed Weights   01/12/14 0508 01/13/14 0719 01/14/14 0526  Weight: 111.4 kg (245 lb 9.5 oz) 109.272 kg (240 lb 14.4 oz) 108.183 kg (238 lb 8 oz)    PHYSICAL EXAM  General: NAD; lying flat in bed NAD HEENT: normal  Neck: supple. JVP flat. Carotids 2+ bilat; no bruits. No lymphadenopathy or thyromegaly appreciated.  Cor: PMI laterally displaced. Regular S1 S2 with +  S3 and 2/6 HSM apex. R upper chest Lungs: clear Abdomen: soft, nontender, obese. No bruits or masses. Good bowel sounds. Mild abdominal distention.  Extremities: no cyanosis, clubbing, rash, no lower extremity edema  Neuro: alert & orientedx3, cranial nerves grossly intact. moves all 4 extremities w/o difficulty.  Accessory Clinical Findings  CBC No results for input(s): WBC, NEUTROABS, HGB, HCT, MCV, PLT in the last 72 hours. Basic Metabolic Panel  Recent Labs  01/13/14 0605 01/14/14 0444  NA 133* 134*  K 3.6 4.1  CL 105 102  CO2 23 24  GLUCOSE 122* 94  BUN 19 26*  CREATININE 2.12* 2.45*  CALCIUM 9.0 9.4   Liver Function Tests No results for input(s): AST, ALT, ALKPHOS, BILITOT, PROT, ALBUMIN in the last 72 hours.   Radiology/Studies  Ir Removal Tun Cv Cath W/o Fl  01/11/2014   INDICATION: History of chronic central venous access for continuous medication infusion. Concern for catheter infection.  EXAM: REMOVAL OF TUNNELED PICC  COMPARISON:  Fluoroscopic guided tunneled PICC line catheter exchange - 10/28/2013  MEDICATIONS: None  COMPLICATIONS: None immediate  PROCEDURE: Maximal barrier sterile technique was utilized including caps,  mask, sterile gowns, sterile gloves, large sterile drape, hand hygiene, and Betadine.  1% lidocaine with epinephrine was injected under sterile conditions along the subcutaneous tunnel. Utilizing a combination of blunt dissection and gentle traction, the catheter was removed intact. Hemostasis was obtained with manual compression. A dressing was placed. The patient tolerated the procedure well without immediate post procedural complication.  IMPRESSION: Successful removal of tunneled PICC line.   Electronically Signed   By: Sandi Mariscal M.D.   On: 01/11/2014 11:53    ASSESSMENT AND PLAN  Mr. Danzy is a 64 y.o. gentlemen with severe HF due to NICM (EF 20-25%) with multiple hospital admissions for HF exacerbations. He also has history of  ventricular tachycardia s/p St Jude ICD by Dr. Lovena Le, CKD: stage IV, and paroxysmal atrial arrhythmias on amiodarone as well as sarcoidosis and hypertension. Cath in 2008 by Dr. Terrence Dupont showed no CAD.Seen at [facility] and not felt to be a transplant candidate due to renal failure and lack of family support. Milrinone initiated in January 2014 for low output. 03/01/12 milrinone increased 0.363mc/kg/min. He was admitted from CHF clinic with dyspnea, fatigue and nausea.    1) VT/VF - Quiescent on po amiodarone.  2) GNR bacteremia-PICC with GNR. PICC out. Cultures showed enterobacter cloace. Narrow antibiotics to rocephin. Repeat blood cultures NGTD. Will plan to replace PICC with IR tomorrow. 3) Chronic systolic HF: NICM, EF 03-21% (06/2013). St Jude ICD. He has been on home milrinone 0.375 mcg/kg/min since January 2014. NYHA III. ECHO EF 20-25%. Weight down 1 pounds. Volume status down. Hold lasix.  4) Atrial arrhythmias: Paroxysmal. Regular rhythm today. On Eliquis.  -- BB discontinued 01/09/14 due to hypotension.  5) CKD, stage IV:  Weight down. Creatinine worse today. Will hold diuretics.  6) Hypokalemia- Labs pending. K 4.1 . Continue to monitor   Possibly home tomorrow after PICC replaced.    Jaylyn Iyer,MD 12:44 PM

## 2014-01-14 NOTE — Discharge Instructions (Signed)

## 2014-01-15 ENCOUNTER — Encounter (HOSPITAL_COMMUNITY): Payer: Self-pay | Admitting: Orthopaedic Surgery

## 2014-01-15 ENCOUNTER — Inpatient Hospital Stay (HOSPITAL_COMMUNITY): Payer: Medicare Other

## 2014-01-15 LAB — BASIC METABOLIC PANEL
Anion gap: 16 — ABNORMAL HIGH (ref 5–15)
BUN: 24 mg/dL — AB (ref 6–23)
CO2: 23 mmol/L (ref 19–32)
CREATININE: 1.99 mg/dL — AB (ref 0.50–1.35)
Calcium: 9.9 mg/dL (ref 8.4–10.5)
Chloride: 100 mEq/L (ref 96–112)
GFR, EST AFRICAN AMERICAN: 39 mL/min — AB (ref >90)
GFR, EST NON AFRICAN AMERICAN: 34 mL/min — AB (ref >90)
Glucose, Bld: 128 mg/dL — ABNORMAL HIGH (ref 70–99)
POTASSIUM: 4.2 mmol/L (ref 3.5–5.1)
SODIUM: 139 mmol/L (ref 135–145)

## 2014-01-15 MED ORDER — CARVEDILOL 3.125 MG PO TABS: 3.1250 mg | ORAL_TABLET | Freq: Two times a day (BID) | ORAL | Status: DC

## 2014-01-15 MED ORDER — LIDOCAINE HCL 1 % IJ SOLN
INTRAMUSCULAR | Status: AC
  Filled 2014-01-15: qty 20

## 2014-01-15 MED ORDER — TORSEMIDE 20 MG PO TABS: 40.0000 mg | ORAL_TABLET | Freq: Every day | ORAL | Status: DC

## 2014-01-15 MED ORDER — POTASSIUM CHLORIDE CRYS ER 20 MEQ PO TBCR: 40.0000 meq | EXTENDED_RELEASE_TABLET | Freq: Every day | ORAL | Status: DC

## 2014-01-15 MED ORDER — POTASSIUM CHLORIDE CRYS ER 20 MEQ PO TBCR: 20.0000 meq | EXTENDED_RELEASE_TABLET | Freq: Every day | ORAL | Status: DC

## 2014-01-15 MED ORDER — AMIODARONE HCL 200 MG PO TABS: 200.0000 mg | ORAL_TABLET | Freq: Two times a day (BID) | ORAL | Status: DC

## 2014-01-15 NOTE — Progress Notes (Signed)
Patient Name: [name] Date of Encounter: 01/15/2014     Active Problems:   Chronic systolic CHF (congestive heart failure), NYHA class 3-4   Paroxysmal VT   Hypokalemia   Chronic systolic heart failure   Acute systolic CHF (congestive heart failure), NYHA class 3   Gram-negative bacteremia    SUBJECTIVE  Yesterday diuretics held. No further VT. Echo with EF 20-25%. RV ok.   Bcx from PICC growing GNR. Enterobac Cloace. Repeat cultures drawn 01/12/14. Remain negative.  Overall feeling better. Denies SOB.   CURRENT MEDS . allopurinol  100 mg Oral Daily  . amiodarone  200 mg Oral BID  . apixaban  5 mg Oral BID  . carvedilol  3.125 mg Oral BID WC  . cefTRIAXone (ROCEPHIN)  IV  2 g Intravenous Q24H  . escitalopram  10 mg Oral Daily  . magnesium oxide  400 mg Oral BID  . pantoprazole  80 mg Oral Q1200  . potassium chloride  20 mEq Oral Daily  . sodium chloride  10-40 mL Intracatheter Q12H  . sodium chloride  3 mL Intravenous Q12H  . spironolactone  25 mg Oral Daily  . sucralfate  1 g Oral TID AC & HS    OBJECTIVE  Filed Vitals:   01/14/14 2024 01/15/14 0222 01/15/14 0626 01/15/14 0822  BP: 111/71 112/78 109/77 109/74  Pulse: 70 72 73 65  Temp: 98 F (36.7 C) 97.9 F (36.6 C) 98 F (36.7 C)   TempSrc: Oral Oral Oral   Resp: 18 17 18    Height:      Weight:   240 lb 1.3 oz (108.9 kg)   SpO2: 95% 92% 96%     Intake/Output Summary (Last 24 hours) at 01/15/14 0832 Last data filed at 01/15/14 0626  Gross per 24 hour  Intake 1131.27 ml  Output   1425 ml  Net -293.73 ml   Filed Weights   01/13/14 0719 01/14/14 0526 01/15/14 0626  Weight: 240 lb 14.4 oz (109.272 kg) 238 lb 8 oz (108.183 kg) 240 lb 1.3 oz (108.9 kg)    PHYSICAL EXAM  General: NAD; lying flat in bed NAD HEENT: normal  Neck: supple. JVP flat. Carotids 2+ bilat; no bruits. No lymphadenopathy or thyromegaly appreciated.  Cor: PMI laterally displaced. Regular S1 S2 with + S3 and 2/6 HSM  apex. R upper chest Lungs: clear Abdomen: soft, nontender, obese. No bruits or masses. Good bowel sounds. Mild abdominal distention.  Extremities: no cyanosis, clubbing, rash, no lower extremity edema  Neuro: alert & orientedx3, cranial nerves grossly intact. moves all 4 extremities w/o difficulty.   EKG: NSR   Accessory Clinical Findings  CBC No results for input(s): WBC, NEUTROABS, HGB, HCT, MCV, PLT in the last 72 hours. Basic Metabolic Panel  Recent Labs  01/13/14 0605 01/14/14 0444  NA 133* 134*  K 3.6 4.1  CL 105 102  CO2 23 24  GLUCOSE 122* 94  BUN 19 26*  CREATININE 2.12* 2.45*  CALCIUM 9.0 9.4   Liver Function Tests No results for input(s): AST, ALT, ALKPHOS, BILITOT, PROT, ALBUMIN in the last 72 hours.   Radiology/Studies  Ir Removal Tun Cv Cath W/o Fl  01/11/2014   INDICATION: History of chronic central venous access for continuous medication infusion. Concern for catheter infection.  EXAM: REMOVAL OF TUNNELED PICC  COMPARISON:  Fluoroscopic guided tunneled PICC line catheter exchange - 10/28/2013  MEDICATIONS: None  COMPLICATIONS: None immediate  PROCEDURE: Maximal barrier sterile technique was utilized  including caps, mask, sterile gowns, sterile gloves, large sterile drape, hand hygiene, and Betadine.  1% lidocaine with epinephrine was injected under sterile conditions along the subcutaneous tunnel. Utilizing a combination of blunt dissection and gentle traction, the catheter was removed intact. Hemostasis was obtained with manual compression. A dressing was placed. The patient tolerated the procedure well without immediate post procedural complication.  IMPRESSION: Successful removal of tunneled PICC line.   Electronically Signed   By: [name] M.D.   On: 01/11/2014 11:53    ASSESSMENT AND PLAN  [name] is a 64 y.o. gentlemen with severe HF due to NICM (EF 20-25%) with multiple hospital admissions for HF exacerbations. He also has history of ventricular  tachycardia s/p St Jude ICD by Dr. Lovena [name], CKD: stage IV, and paroxysmal atrial arrhythmias on amiodarone as well as sarcoidosis and hypertension. Cath in 2008 by Dr. Terrence [name] showed no CAD.Seen at [facility] and not felt to be a transplant candidate due to renal failure and lack of family support. Milrinone initiated in January 2014 for low output. 03/01/12 milrinone increased 0.342mc/kg/min. He was admitted from CHF clinic with dyspnea, fatigue and nausea.    1) VT/VF - Quiescent on po amiodarone 200 mg twice a day.  2) GNR bacteremia-PICC with GNR. PICC out. Cultures showed enterobacter cloace. Narrow antibiotics to rocephin. Repeat blood cultures NGTD. Place PICC today by IR. Marland Kitchen 3) Chronic systolic HF: NICM, EF 83-41% (06/2013). St Jude ICD. He has been on home milrinone 0.375 mcg/kg/min since January 2014. NYHA III. ECHO EF 20-25%. Continue carvedilol 3.125 mg twice a day. Weight up 2 pounds. Renal function pending will consider restarting diuretics later.  4) Atrial arrhythmias: Paroxysmal. Regular rhythm today. On Eliquis. On amiodarone 200 mg twice a day.  -- BB discontinued 01/09/14 due to hypotension but restarted 01/11/14.   5) CKD, stage IV:  Weight up 2 pounds. Creatinine pending.   6) Hypokalemia- Labs pending.   BMET pending.   CLEGG,AMY,NP-C  8:32 AM   Patient seen and examined with Darrell Grinder, NP. We discussed all aspects of the encounter. I agree with the assessment and plan as stated above.   Doing well. For PICC today. Await BMET. Likely can restart low-dose torsemide soon. Home after PICC placement either this afternoon or tomorrow am.  [name] 9:09 AM

## 2014-01-15 NOTE — Discharge Summary (Signed)
Advanced Heart Failure Team  Discharge Summary   Patient ID: [name] MRN: [id number], DOB/AGE: [birth year] 64 y.o. Admit date: 01/09/2014 D/C date:     01/15/2014   Primary Discharge Diagnoses:  ) VT/VF - Quiescent on po amiodarone 200 mg twice a day.  2) GNR bacteremia-PICC with GNR. PICC out. Cultures showed enterobacter cloace. Was on rocephin. Repeat blood cultures NGTD. Place PICC today by IR. Marland Kitchen 3) Chronic systolic HF: NICM, EF 00-17% (06/2013). St Jude ICD. He has been on home milrinone 0.375 mcg/kg/min since January 2014. NYHA III. ECHO EF 20-25%. 4) Atrial arrhythmias: Paroxysmal.  On Eliquis. On amiodarone 200 mg twice a day.  5) CKD, stage IV: Weight up 2 pounds. Creatinine pending.  6) Hypokalemia   Hospital Course:  [name] is a 64 y.o. gentlemen with severe HF due to NICM (EF 20-25%) with multiple hospital admissions for HF exacerbations. He also has history of ventricular tachycardia s/p St Jude ICD by Dr. Lovena Le, CKD: stage IV, and paroxysmal atrial arrhythmias on amiodarone as well as sarcoidosis and hypertension. Cath in 2008 by Dr. Terrence Dupont showed no CAD.Seen at [facility] and not felt to be a transplant candidate due to renal failure and lack of family support. Milrinone initiated in January 2014 for low output. 03/01/12 milrinone increased 0.338mc/kg/min.   He was admitted from CHF clinic with dyspnea, fatigue and nausea. Developed a fever with blood cultures showing GNR--enterobacter cloace. PICC was removed and he was started vancomycin and zosyn. Antibiotics narrowed once blood cultures finalized. Repeat blood cultures obtained were negative and PICC was replaced by IR for home milrinone. Clinically he improved with not further dyspnea or chills.   He also had multiple runs of VT/VF and was started on amiodarone. He also restarted low dose carvediol.  No further events noted on telemetry.  He will continue on amiodarone 200 mg twice a day as well as eliquis.    From  HF perspective his volume was low so diuretics have been adjusted. Renal function  was followed closely with creatinine on the day of discharge creatinine was 1.99 He will continued to be followed by [facility] for home milrinone and week BMET. Plan to see him in the HF clinic next.   Discharge Weight Range: Weight 240 pounds.  Discharge Vitals: Blood pressure 108/65, pulse 64, temperature 97.7 F (36.5 C), temperature source Oral, resp. rate 18, height 6\' 3"  (1.905 m), weight 240 lb 1.3 oz (108.9 kg), SpO2 94 %.  Labs: Lab Results  Component Value Date   WBC 7.8 01/09/2014   HGB 12.4* 01/09/2014   HCT 36.8* 01/09/2014   MCV 85.2 01/09/2014   PLT 141* 01/09/2014    Recent Labs Lab 01/09/14 1144  01/15/14 0915  NA 137  < > 139  K 3.1*  < > 4.2  CL 103  < > 100  CO2 28  < > 23  BUN 38*  < > 24*  CREATININE 2.41*  < > 1.99*  CALCIUM 9.3  < > 9.9  PROT 7.0  --   --   BILITOT 1.2  --   --   ALKPHOS 72  --   --   ALT 23  --   --   AST 35  --   --   GLUCOSE 131*  < > 128*  < > = values in this interval not displayed. Lab Results  Component Value Date   CHOL 171 06/23/2013   HDL 63 06/23/2013   LDLCALC 70  06/23/2013   TRIG 188* 06/23/2013   BNP (last 3 results)  Recent Labs  06/11/13 1254 08/06/13 1245 12/09/13 2250  PROBNP 542.4* 3225.0* 665.8*    Diagnostic Studies/Procedures   Ir Fluoro Guide Cv Line Right  01/15/2014   CLINICAL DATA:  Chronic systolic CHF, needs long-term venous access for iv therapy.  EXAM: RIGHT IJ CENTRAL VENOUS CATHETER PLACEMENT UNDER ULTRASOUND AND FLUOROSCOPIC GUIDANCE:  FLUOROSCOPY TIME:  6 seconds  TECHNIQUE: Patency of the right IJ vein was confirmed with ultrasound with image documentation. An appropriate skin site was determined. Skin site was marked. Region was prepped using maximum barrier technique including cap and mask, sterile gown, sterile gloves, large sterile sheet, and Chlorhexidine as cutaneous antisepsis. The region was infiltrated  locally with 1% lidocaine. Under real-time ultrasound guidance, the right IJ vein was accessed with a 21 gauge micropuncture needle; the needle tip within the vein was confirmed with ultrasound image documentation. The needle exchanged over a guidewire for peel-away  sheath through which an 25cm 5 [place] PowerPICC dual lumen catheter was  advanced. This was positioned with the tip at the cavoatrial junction. Spot  chest radiograph shows good positioning and no pneumothorax. Catheter was flushed  and sutured externally with 0-Prolene sutures. Patient tolerated the procedure  well, with no immediate complication.  IMPRESSION: 1. Technically successful right IJ double lumen power injectable central venous catheter placement.   Electronically Signed   By: Arne Cleveland M.D.   On: 01/15/2014 16:03   Ir US Guide Vasc Access Right  01/15/2014   CLINICAL DATA:  Chronic systolic CHF, needs long-term venous access for iv therapy.  EXAM: RIGHT IJ CENTRAL VENOUS CATHETER PLACEMENT UNDER ULTRASOUND AND FLUOROSCOPIC GUIDANCE:  FLUOROSCOPY TIME:  6 seconds  TECHNIQUE: Patency of the right IJ vein was confirmed with ultrasound with image documentation. An appropriate skin site was determined. Skin site was marked. Region was prepped using maximum barrier technique including cap and mask, sterile gown, sterile gloves, large sterile sheet, and Chlorhexidine as cutaneous antisepsis. The region was infiltrated locally with 1% lidocaine. Under real-time ultrasound guidance, the right IJ vein was accessed with a 21 gauge micropuncture needle; the needle tip within the vein was confirmed with ultrasound image documentation. The needle exchanged over a guidewire for peel-away  sheath through which an 25cm 5 [place] PowerPICC dual lumen catheter was  advanced. This was positioned with the tip at the cavoatrial junction. Spot  chest radiograph shows good positioning and no pneumothorax. Catheter was flushed  and sutured externally with  0-Prolene sutures. Patient tolerated the procedure  well, with no immediate complication.  IMPRESSION: 1. Technically successful right IJ double lumen power injectable central venous catheter placement.   Electronically Signed   By: Arne Cleveland M.D.   On: 01/15/2014 16:03    Discharge Medications     Medication List    STOP taking these medications        ALKA-SELTZER PO     ibuprofen 800 MG tablet  Commonly known as:  ADVIL,MOTRIN      TAKE these medications        allopurinol 100 MG tablet  Commonly known as:  ZYLOPRIM  Take 1 tablet (100 mg total) by mouth daily.     amiodarone 200 MG tablet  Commonly known as:  PACERONE  Take 1 tablet (200 mg total) by mouth 2 (two) times daily.     apixaban 5 MG Tabs tablet  Commonly known as:  ELIQUIS  Take 1 tablet (  5 mg total) by mouth 2 (two) times daily.     carvedilol 3.125 MG tablet  Commonly known as:  COREG  Take 1 tablet (3.125 mg total) by mouth 2 (two) times daily with a meal.     diphenhydrAMINE 50 MG capsule  Commonly known as:  BENADRYL  Take 50 mg by mouth daily as needed for itching.     eplerenone 25 MG tablet  Commonly known as:  INSPRA  Take 12.5 mg by mouth every other day.     escitalopram 10 MG tablet  Commonly known as:  LEXAPRO  1/2 tab by mouth once daily for 1 week, then increase to a full tab once daily     esomeprazole 40 MG capsule  Commonly known as:  NEXIUM  Take 40 mg by mouth 2 (two) times daily as needed (reflux).     fluticasone 50 MCG/ACT nasal spray  Commonly known as:  FLONASE  Place 2 sprays into both nostrils daily as needed for allergies.     HORIZANT 300 MG Tbcr  Generic drug:  Gabapentin Enacarbil ER  Take 300 mg by mouth at bedtime as needed (pain).     hydrOXYzine 25 MG tablet  Commonly known as:  ATARAX/VISTARIL  Take 1 tablet (25 mg total) by mouth 2 (two) times daily as needed.     LORazepam 0.5 MG tablet  Commonly known as:  ATIVAN  Take 1 tablet (0.5 mg total)  by mouth every 8 (eight) hours as needed for anxiety.     Magnesium Oxide 400 (240 MG) MG Tabs  Take 1 tablet by mouth 2 (two) times daily.     metolazone 2.5 MG tablet  Commonly known as:  ZAROXOLYN  Take 1 tablet (2.5 mg total) by mouth daily as needed (for fluid retention).     milrinone 20 MG/100ML Soln infusion  Commonly known as:  PRIMACOR  Inject 0.375 mcg/kg/min into the vein continuous. 440mg  in 46ml infusion 2.5 ml per hour     Oxycodone HCl 20 MG Tabs  Take 1 tablet (20 mg total) by mouth 3 (three) times daily as needed (pain.).     potassium chloride SA 20 MEQ tablet  Commonly known as:  K-DUR,KLOR-CON  Take 1 tablet (20 mEq total) by mouth daily.     promethazine 25 MG tablet  Commonly known as:  PHENERGAN  Take 25 mg by mouth every 6 (six) hours as needed for nausea or vomiting.     ranitidine 150 MG capsule  Commonly known as:  ZANTAC  Take 150 mg by mouth daily as needed for heartburn (reflux).     senna-docusate 8.6-50 MG per tablet  Commonly known as:  Senokot-S  Take 1-2 tablets by mouth 2 (two) times daily as needed for mild constipation or moderate constipation.     sucralfate 1 GM/10ML suspension  Commonly known as:  CARAFATE  Take 10 mLs (1 g total) by mouth 4 (four) times daily.     torsemide 20 MG tablet  Commonly known as:  DEMADEX  Take 2 tablets (40 mg total) by mouth daily.     zolpidem 5 MG tablet  Commonly known as:  AMBIEN  Take 1 tablet (5 mg total) by mouth at bedtime as needed for sleep.        Disposition   The patient will be discharged in stable condition to home. Discharge Instructions    Contraindication to ACEI at discharge    Complete by:  As directed  Diet - low sodium heart healthy    Complete by:  As directed      Heart Failure patients record your daily weight using the same scale at the same time of day    Complete by:  As directed      Increase activity slowly    Complete by:  As directed            Follow-up Information    Follow up with Glori Bickers, MD On 01/21/2014.   Specialty:  Cardiology   Why:  Earnie Larsson Code [id number]   Contact information:   [place] [address] [phone]         Duration of Discharge Encounter: Greater than 35 minutes   Signed, CLEGG,AMY NP-C  01/15/2014, 4:10 PM  Patient seen and examined with Darrick Grinder, NP. We discussed all aspects of the encounter. I agree with the assessment and plan as stated above. He is much improved. PICC has been exchanged. Will reach out to ID to determine duration of antibiotic course. Continue home milrinone. Cut back torsemide. Will follow closely in clinic.   Quillian Quince Lesieli Bresee,MD 9:42 PM

## 2014-01-15 NOTE — Progress Notes (Signed)
Cardiac monitor discontinued per d/c orders. CCMD notified. Pam from [facility] in room setting up patient's home Milrinone. Home discharge instructions discussed with patient. Discussed follow up appointment, medications, diet and activity. Patient able to use teach back with Heart Failure instructions.

## 2014-01-15 NOTE — Progress Notes (Signed)
Patient taken out of hospital via wheelchair for discharge.

## 2014-01-15 NOTE — Procedures (Signed)
R IJ tunneled PICC No complication No blood loss. See complete dictation in [facility].

## 2014-01-16 ENCOUNTER — Ambulatory Visit: Payer: Medicare Other | Admitting: Family

## 2014-01-16 ENCOUNTER — Telehealth: Payer: Self-pay | Admitting: Family

## 2014-01-16 DIAGNOSIS — I5022 Chronic systolic (congestive) heart failure: Secondary | ICD-10-CM | POA: Diagnosis not present

## 2014-01-16 LAB — CULTURE, BLOOD (ROUTINE X 2)
CULTURE: NO GROWTH
CULTURE: NO GROWTH

## 2014-01-16 MED ORDER — ZOLPIDEM TARTRATE 5 MG PO TABS: 7.5000 mg | ORAL_TABLET | Freq: Every day | ORAL | Status: DC

## 2014-01-16 NOTE — Telephone Encounter (Signed)
Caller name: Avrohom, Mckelvin Relationship to patient: self  Can be reached:[phone]   Reason for call: Pt states he was admitted to [facility] and discharged 01/15/14. Pt scheduled hospital follow up with PA 01/26/14

## 2014-01-16 NOTE — Telephone Encounter (Signed)
Spoke with pharmacy and verified change in directions to 1 1/2 tablets qhs prn.

## 2014-01-16 NOTE — Telephone Encounter (Signed)
Caller name: Vihan, Santagata Relationship to patient: self  Can be reached: [phone]   Reason for call:  Pt requesting a refill zolpidem (AMBIEN) 5 MG tablet .

## 2014-01-16 NOTE — Telephone Encounter (Signed)
Looks like pt is taking 1 1/2 at bedtime. Please advise directions and quantity?

## 2014-01-16 NOTE — Telephone Encounter (Signed)
Pharmacy calling

## 2014-01-16 NOTE — Telephone Encounter (Signed)
Admit date: 01/09/2014 D/C date: 01/15/2014  Reason for admission:  CHF  Left a message for call back.

## 2014-01-16 NOTE — Telephone Encounter (Signed)
Rx called to pharmacy voicemail as below. Left message on pt's voicemail that request was completed and to call if any questions.

## 2014-01-16 NOTE — Telephone Encounter (Signed)
Please send 45 tabs.

## 2014-01-16 NOTE — Telephone Encounter (Signed)
Trying to confirm directions. Wants to know if directions have been increased? Last fill was 1 tablet and this refill 1 1/2? Best # [phone]

## 2014-01-18 LAB — CULTURE, BLOOD (ROUTINE X 2)
Culture: NO GROWTH
Culture: NO GROWTH

## 2014-01-19 ENCOUNTER — Encounter: Payer: Self-pay | Admitting: Internal Medicine

## 2014-01-19 NOTE — Telephone Encounter (Signed)
Transition Care Management Follow-up Telephone Call   ADMISSION DATE:  01-09-2014   DISCHARGE DATE: 01-15-2014  REASON FOR ADMISSION: Dyspnea,Fatigue,Nausea,Fever    How have you been since you were released from the hospital? Patient states he is experiencing weakness and continued stomach issues since hospitalization.     Do you understand why you were in the hospital? YES   Do you understand the discharge instrcutions? Yes  Items Reviewed:  Medications reviewed:  Medications unchanged on discharge.  Allergies reviewed: Nitroglycerine,Colchicine  Dietary changes reviewed:Low Sodium Heart Healthy  Referrals reviewed: Yes, appointment with Dr. Candee Furbish (Cardiology) 01/21/14 and Hansel Starling 01/26/14 at 3:00pm.    Functional Questionnaire:   Activities of Daily Living (ADLs): SOB upon excertion, Can dress and bathe himself without help,May need help coming to appointments however patient states he drives although he should not. Patient states he does have someone to drive him if necessary.    Any transportation issues/concerns?: No   Any patient concerns? Patient concerned about his stomach issues. States he has been to Gastoenterologist and Colonoscopy was ordered but he was not able to have done because his heart rate increases when they begin procedure. States no one seems to be able to find out what is going on with his stomach. Would like for this to be resolved.     Confirmed importance and date/time of follow-up visits scheduled: Yes    Confirmed with patient if condition begins to worsen call PCP or go to the ER. Yes   Patient was given the [facility] line [phone]: Yes

## 2014-01-19 NOTE — Telephone Encounter (Signed)
I have never seen pt before that I can see. Based on my review and his complaints I think he needs to come in sooner rather than waiting on wed with me.Or go UC if no one has availability in our office.

## 2014-01-19 NOTE — Telephone Encounter (Signed)
He has appt 1/20. If feeling worse before that he should come to ER.

## 2014-01-19 NOTE — Telephone Encounter (Signed)
I can see at 7 AM if you make it a 30-minute appointment.  Otherwise I am full as I am leaving before lunch.

## 2014-01-19 NOTE — Telephone Encounter (Signed)
I am out of the office until Wednesday.

## 2014-01-19 NOTE — Telephone Encounter (Deleted)
Transition Care Management Follow-up Telephone Call  How have you been since you were released from the hospital? ***   Do you understand why you were in the hospital? {YES/NO/WILD CARDS:18581}   Do you understand the discharge instrcutions? {YES/NO/WILD IHKVQ:25956}  Items Reviewed:  Medications reviewed: {YES/NO/WILD LOVFI:43329}  Allergies reviewed: {YES/NO/WILD JJOAC:16606}  Dietary changes reviewed: {YES/NO/WILD TKZSW:10932}  Referrals reviewed: {YES/NO/WILD TFTDD:22025}   Functional Questionnaire:   Activities of Daily Living (ADLs):   He states they are independent in the following: {adls:17999} States they require assistance with the following: {adls:17999}   Any transportation issues/concerns?: {YES/NO/WILD CARDS:18581}   Any patient concerns? {YES/NO/WILD KYHCW:23762}   Confirmed importance and date/time of follow-up visits scheduled: {YES/NO/WILD CARDS:18581}   Confirmed with patient if condition begins to worsen call PCP or go to the ER.  Patient was given the [facility]a-Nurse line [phone]: {YES/NO/WILD VPXTG:62694}

## 2014-01-20 ENCOUNTER — Other Ambulatory Visit (HOSPITAL_COMMUNITY): Payer: Self-pay | Admitting: Adult Health

## 2014-01-20 ENCOUNTER — Telehealth (HOSPITAL_COMMUNITY): Payer: Self-pay | Admitting: *Deleted

## 2014-01-20 ENCOUNTER — Ambulatory Visit: Payer: Medicare Other | Admitting: Physician Assistant

## 2014-01-20 MED ORDER — CEFUROXIME AXETIL 500 MG PO TABS: 500.0000 mg | ORAL_TABLET | Freq: Two times a day (BID) | ORAL | Status: DC

## 2014-01-20 NOTE — Telephone Encounter (Signed)
01/20/2014 7:10 AM Amy Estrella Deeds, NP [name] Pain, RN Patient Calls       Scarlette Calico, RN      Comment: Please call him this morning and ask him to pick antibiotics at his pharmacy. He will need to take ceftin 500 mg twice a day for 7 days. I have sent to pharmacy this am. Thanks Amy

## 2014-01-20 NOTE — Telephone Encounter (Signed)
Spoke w/pt, he is aware antibiotic has been sent in and will pick it up today, he states he is feeling better and will keep appt as sch tomorrow

## 2014-01-21 ENCOUNTER — Ambulatory Visit (HOSPITAL_COMMUNITY)
Admission: RE | Admit: 2014-01-21 | Discharge: 2014-01-21 | Disposition: A | Discharge status: Home / Self Care | Payer: Medicare Other | Source: Ambulatory Visit | Attending: Internal Medicine | Admitting: Internal Medicine

## 2014-01-21 VITALS — BP 100/72 | HR 84 | Wt 243.2 lb

## 2014-01-21 DIAGNOSIS — K219 Gastro-esophageal reflux disease without esophagitis: Secondary | ICD-10-CM | POA: Insufficient documentation

## 2014-01-21 DIAGNOSIS — D869 Sarcoidosis, unspecified: Secondary | ICD-10-CM | POA: Diagnosis not present

## 2014-01-21 DIAGNOSIS — M109 Gout, unspecified: Secondary | ICD-10-CM | POA: Insufficient documentation

## 2014-01-21 DIAGNOSIS — I129 Hypertensive chronic kidney disease with stage 1 through stage 4 chronic kidney disease, or unspecified chronic kidney disease: Secondary | ICD-10-CM | POA: Insufficient documentation

## 2014-01-21 DIAGNOSIS — I472 Ventricular tachycardia, unspecified: Secondary | ICD-10-CM | POA: Insufficient documentation

## 2014-01-21 DIAGNOSIS — R3911 Hesitancy of micturition: Secondary | ICD-10-CM

## 2014-01-21 DIAGNOSIS — I5022 Chronic systolic (congestive) heart failure: Secondary | ICD-10-CM | POA: Insufficient documentation

## 2014-01-21 DIAGNOSIS — I4891 Unspecified atrial fibrillation: Secondary | ICD-10-CM | POA: Diagnosis not present

## 2014-01-21 DIAGNOSIS — A4150 Gram-negative sepsis, unspecified: Secondary | ICD-10-CM

## 2014-01-21 DIAGNOSIS — Z95810 Presence of automatic (implantable) cardiac defibrillator: Secondary | ICD-10-CM | POA: Diagnosis not present

## 2014-01-21 DIAGNOSIS — I2510 Atherosclerotic heart disease of native coronary artery without angina pectoris: Secondary | ICD-10-CM | POA: Insufficient documentation

## 2014-01-21 DIAGNOSIS — N184 Chronic kidney disease, stage 4 (severe): Secondary | ICD-10-CM | POA: Diagnosis not present

## 2014-01-21 DIAGNOSIS — Z7901 Long term (current) use of anticoagulants: Secondary | ICD-10-CM | POA: Insufficient documentation

## 2014-01-21 DIAGNOSIS — E780 Pure hypercholesterolemia: Secondary | ICD-10-CM | POA: Diagnosis not present

## 2014-01-21 DIAGNOSIS — Z79899 Other long term (current) drug therapy: Secondary | ICD-10-CM | POA: Diagnosis not present

## 2014-01-21 DIAGNOSIS — I498 Other specified cardiac arrhythmias: Secondary | ICD-10-CM | POA: Diagnosis not present

## 2014-01-21 DIAGNOSIS — I429 Cardiomyopathy, unspecified: Secondary | ICD-10-CM | POA: Diagnosis not present

## 2014-01-21 DIAGNOSIS — I252 Old myocardial infarction: Secondary | ICD-10-CM | POA: Diagnosis not present

## 2014-01-21 DIAGNOSIS — R7881 Bacteremia: Secondary | ICD-10-CM | POA: Diagnosis not present

## 2014-01-21 NOTE — Telephone Encounter (Signed)
Patient was unable to come in on earlier appointment that was open for him. Will keep appointment he has on 25th of this month.

## 2014-01-21 NOTE — Patient Instructions (Signed)
Your physician recommends that you schedule a follow-up appointment in: 3 weeks  

## 2014-01-21 NOTE — Progress Notes (Signed)
Patient ID: [name], male   DOB: 1950/05/21, 64 y.o.   MRN: [id number] PCP: Debbrah Alar   HPI: [name] is a 64 y.o. gentlemen with severe HF due to NICM (EF 20-25%) with multiple hospital admissions for HF exacerbations. He also has history of ventricular tachycardia s/p St Jude ICD by Dr. Lovena Le, CKD: stage IV, and paroxysmal atrial arrhythmias on amiodarone as well as sarcoidosis and hypertension. Cath in 2008 by Dr. Terrence Dupont showed no CAD.Seen at [facility] and not felt to be a transplant candidate due to renal failure and lack of family support.  Milrinone initiated in January 2014 for low output. 03/01/12 milrinone increased 0.347mc/kg/min.   Admitted to [facility] 01/09/14-01/15/2014. with an infected PICC. Gram negative bacteremia with antibiotic course. Hospitalization was complicated by VT/VF. Treated with Discharge weight was 240 pounds.   He returns for follow up. Overall he is feeling much better. Denies dyspnea/pnd/orthopnea. increased dyspnea and fatigue. Weight at home 233-234 pounds. Has [facility] for weekly milrinone. Limiting fluid intake to less 2 liters.   ECHO 03/22/12 EF 15%  Echo 6/15: 20-25%  04/26/12 S/P Successful DC-CV for atrial fibrillation. Remains on amiodarone.   RHC 04/30/13 RA = 4  RV = 20/1/3  PA = 23/8 (15)  PCW = 5  Fick cardiac output/index =4.5/1.9  Thermo CO/CI = 3.9/1.6  PVR =  FA sat = 98%  PA sat = 62%, 64% - On milrinone 0.25  He is not on an ace inhibitor due to elevated creatinine. Not on nitrates due to Viagra use.   Labs:  08/15/12 Creatinine 4.24 Potassium 3.3 09/19/12 Creatinine 3.98, Potassium 3.1, BUN 78 10/07/12 Creatinine 2.62, Potassium 3.4 10/18/12 Creatinine 3.3, K+3.1 11/08/12 Creatinine 2.15 K 4.0 11/25/12 Creatinine 2.93, K+ 3.5 03/10/13: K+ 2.9, Creatinine 2.63 04/30/13: K+ 3.7, creatinine 2.4 6/15: K 3, creatinine 3.6 09/11/13: K 3.1 Cr 1.87  10/15: K 3.8, creatinine 2.13, HCT 40.7 11/06/13 K 4.0 Creatinine 1.58  12/17/13 K 3.6  Creatinine 2.3   ROS: All systems negative except as listed in HPI, PMH and Problem List.  Past Medical History  Diagnosis Date  . CHF (congestive heart failure)   . Sarcoidosis   . Cardiomyopathy, dilated, nonischemic     non ischemic by cath  . Acute on chronic systolic heart failure   . Automatic implantable cardiac defibrillator in situ   . Atrial fibrillation   . NSVT (nonsustained ventricular tachycardia)   . GERD (gastroesophageal reflux disease)   . Hypercholesteremia   . Shortness of breath   . Chronic kidney disease (CKD), stage III (moderate)   . Pacemaker   . Anginal pain   . Gout   . Hypertension     dr [name]  . Coronary artery disease   . Elevated PSA 06/24/2013  . Myocardial infarction   . Headache   . Neuropathy    Current Outpatient Prescriptions  Medication Sig Dispense Refill  . allopurinol (ZYLOPRIM) 100 MG tablet Take 1 tablet (100 mg total) by mouth daily. 30 tablet 6  . amiodarone (PACERONE) 200 MG tablet Take 1 tablet (200 mg total) by mouth 2 (two) times daily. 60 tablet 6  . apixaban (ELIQUIS) 5 MG TABS tablet Take 1 tablet (5 mg total) by mouth 2 (two) times daily. 60 tablet 4  . carvedilol (COREG) 3.125 MG tablet Take 1 tablet (3.125 mg total) by mouth 2 (two) times daily with a meal. 60 tablet 6  . diphenhydrAMINE (BENADRYL) 50 MG capsule Take 50 mg  by mouth daily as needed for itching.    Marland Kitchen eplerenone (INSPRA) 25 MG tablet Take 12.5 mg by mouth every other day.     . escitalopram (LEXAPRO) 10 MG tablet 1/2 tab by mouth once daily for 1 week, then increase to a full tab once daily 30 tablet 0  . esomeprazole (NEXIUM) 40 MG capsule Take 40 mg by mouth 2 (two) times daily as needed (reflux).     . fluticasone (FLONASE) 50 MCG/ACT nasal spray Place 2 sprays into both nostrils daily as needed for allergies. 16 g 2  . Gabapentin Enacarbil ER (HORIZANT) 300 MG TBCR Take 300 mg by mouth at bedtime as needed (pain).     . hydrOXYzine (ATARAX/VISTARIL)  25 MG tablet Take 1 tablet (25 mg total) by mouth 2 (two) times daily as needed. 60 tablet 2  . LORazepam (ATIVAN) 0.5 MG tablet Take 1 tablet (0.5 mg total) by mouth every 8 (eight) hours as needed for anxiety. (Patient taking differently: Take 0.5-1 mg by mouth 2 (two) times daily as needed for anxiety. ) 15 tablet 0  . Magnesium Oxide 400 (240 MG) MG TABS Take 1 tablet by mouth 2 (two) times daily. (Patient taking differently: Take 400 mg by mouth daily. ) 60 tablet 3  . metolazone (ZAROXOLYN) 2.5 MG tablet Take 1 tablet (2.5 mg total) by mouth daily as needed (for fluid retention). 10 tablet 6  . milrinone (PRIMACOR) 20 MG/100ML SOLN infusion Inject 0.375 mcg/kg/min into the vein continuous. 440mg  in 436ml infusion 2.5 ml per hour    . Oxycodone HCl 20 MG TABS Take 1 tablet (20 mg total) by mouth 3 (three) times daily as needed (pain.). (Patient taking differently: Take 20 mg by mouth 3 (three) times daily as needed (pain.). ) 15 tablet 0  . potassium chloride SA (K-DUR,KLOR-CON) 20 MEQ tablet Take 1 tablet (20 mEq total) by mouth daily. 60 tablet 6  . promethazine (PHENERGAN) 25 MG tablet Take 25 mg by mouth every 6 (six) hours as needed for nausea or vomiting.    . ranitidine (ZANTAC) 150 MG capsule Take 150 mg by mouth daily as needed for heartburn (reflux).    Marland Kitchen senna-docusate (SENOKOT-S) 8.6-50 MG per tablet Take 1-2 tablets by mouth 2 (two) times daily as needed for mild constipation or moderate constipation. 60 tablet 3  . sucralfate (CARAFATE) 1 GM/10ML suspension Take 10 mLs (1 g total) by mouth 4 (four) times daily. (Patient taking differently: Take 1 g by mouth 2 (two) times daily. ) 420 mL 1  . torsemide (DEMADEX) 20 MG tablet Take 2 tablets (40 mg total) by mouth daily. (Patient taking differently: Take 60 mg in AM and 40 mg in PM) 60 tablet 6  . zolpidem (AMBIEN) 5 MG tablet Take 1.5 tablets (7.5 mg total) by mouth at bedtime. 45 tablet 0  . cefUROXime (CEFTIN) 500 MG tablet Take 1  tablet (500 mg total) by mouth 2 (two) times daily with a meal. (Patient not taking: Reported on 01/21/2014) 14 tablet 0   No current facility-administered medications for this encounter.   Filed Vitals:   01/21/14 1440  BP: 100/72  Pulse: 84  Weight: 243 lb 4 oz (110.337 kg)  SpO2: 95%    PHYSICAL EXAM: General: NAD; Chronically ill appearing. Ambulated in the clinic without dyspnea.  HEENT: normal  Neck: supple. JVP 6-7 cm. Carotids 2+ bilat; no bruits. No lymphadenopathy or thyromegaly appreciated.  Cor: PMI laterally displaced. Regular S1 S2 with no  S3 and 2/6 HSM apex. R upper chest hickman site with milrinone infusing.  Lungs: clear Abdomen: soft, nontender, obese. No bruits or masses. Good bowel sounds.  Mild abdominal distention.  Extremities: no cyanosis, clubbing, rash, no lower extremity edema  Neuro: alert & orientedx3, cranial nerves grossly intact. moves all 4 extremities w/o difficulty. Affect pleasant     ASSESSMENT & PLAN:   1) Chronic systolic HF: NICM, EF 15-83% (06/2013). St Jude ICD. He has been on home milrinone 0.375 mcg/kg/min since January 2014.  Doing well post hospital. NYHA III. Volume status stable. Continue current dose of torsemide, inspra, carvedilol, and potassium  - - No ACEI with CKD Continue weekly BMET and magnesium per [facility].   2) Atrial arrhythmias: Paroxysmal. Regular rhythm. On Eliquis. Stop BB for now due to hypotension.   3) CKD, stage IV: Recheck creatinine today.   4) Chronic pain: Per pain clinic.  5) Infected PICC. -Gram negative bacteremia. New PICC placed during recent hospitalization. He will complete 7 day course of  ceftin 500 mg twice a day. He will pick today.  6) VT/VF during hospitalization 01/10/14 . On amiodarone 200 mg twice a day.   Follow up in 3-4 weeks.   CLEGG,AMY NP-C  01/21/2014

## 2014-01-22 ENCOUNTER — Ambulatory Visit: Payer: Medicare Other | Admitting: Medical

## 2014-01-22 DIAGNOSIS — I509 Heart failure, unspecified: Secondary | ICD-10-CM | POA: Diagnosis not present

## 2014-01-22 DIAGNOSIS — I5022 Chronic systolic (congestive) heart failure: Secondary | ICD-10-CM | POA: Diagnosis not present

## 2014-01-22 DIAGNOSIS — R0602 Shortness of breath: Secondary | ICD-10-CM | POA: Diagnosis not present

## 2014-01-26 ENCOUNTER — Ambulatory Visit: Payer: Medicare Other | Admitting: Medical

## 2014-01-26 ENCOUNTER — Telehealth: Payer: Self-pay | Admitting: *Deleted

## 2014-01-26 NOTE — Telephone Encounter (Signed)
Pt did not show for appointment 01/26/14 at 1:30pm for hospital follow up.  Pt will not be charged no show fee due to weather.

## 2014-01-27 DIAGNOSIS — R972 Elevated prostate specific antigen [PSA]: Secondary | ICD-10-CM | POA: Diagnosis not present

## 2014-01-27 NOTE — Telephone Encounter (Signed)
Why did I get this?

## 2014-01-27 NOTE — Telephone Encounter (Signed)
Pt no showed for appointment.  Called patient to reschedule.  Pt stated that he had an appt tomorrow with Melissa at 1100 am.  No appointment was noted on appointment desk.  Appointment was rescheduled with Dr. Etter Sjogren for Thursday, 01/29/14 at 11:30 am.

## 2014-01-29 ENCOUNTER — Encounter (HOSPITAL_COMMUNITY): Payer: Medicare Other

## 2014-01-29 ENCOUNTER — Encounter: Payer: Self-pay | Admitting: Family Medicine

## 2014-01-29 ENCOUNTER — Ambulatory Visit (INDEPENDENT_AMBULATORY_CARE_PROVIDER_SITE_OTHER): Payer: Medicare Other | Admitting: Family Medicine

## 2014-01-29 VITALS — BP 125/91 | HR 69 | Temp 98.1°F | Wt 246.0 lb

## 2014-01-29 DIAGNOSIS — A4150 Gram-negative sepsis, unspecified: Secondary | ICD-10-CM

## 2014-01-29 DIAGNOSIS — N184 Chronic kidney disease, stage 4 (severe): Secondary | ICD-10-CM | POA: Diagnosis not present

## 2014-01-29 DIAGNOSIS — I509 Heart failure, unspecified: Secondary | ICD-10-CM | POA: Diagnosis not present

## 2014-01-29 DIAGNOSIS — F419 Anxiety disorder, unspecified: Secondary | ICD-10-CM | POA: Diagnosis not present

## 2014-01-29 DIAGNOSIS — R7881 Bacteremia: Secondary | ICD-10-CM | POA: Diagnosis not present

## 2014-01-29 DIAGNOSIS — I5022 Chronic systolic (congestive) heart failure: Secondary | ICD-10-CM

## 2014-01-29 DIAGNOSIS — I5023 Acute on chronic systolic (congestive) heart failure: Secondary | ICD-10-CM | POA: Diagnosis not present

## 2014-01-29 DIAGNOSIS — R0602 Shortness of breath: Secondary | ICD-10-CM | POA: Diagnosis not present

## 2014-01-29 DIAGNOSIS — F418 Other specified anxiety disorders: Secondary | ICD-10-CM

## 2014-01-29 DIAGNOSIS — R972 Elevated prostate specific antigen [PSA]: Secondary | ICD-10-CM | POA: Diagnosis not present

## 2014-01-29 DIAGNOSIS — Z79899 Other long term (current) drug therapy: Secondary | ICD-10-CM | POA: Diagnosis not present

## 2014-01-29 DIAGNOSIS — Z79891 Long term (current) use of opiate analgesic: Secondary | ICD-10-CM | POA: Diagnosis not present

## 2014-01-29 LAB — BASIC METABOLIC PANEL
BUN: 35 mg/dL — ABNORMAL HIGH (ref 6–23)
CHLORIDE: 104 meq/L (ref 96–112)
CO2: 26 mEq/L (ref 19–32)
CREATININE: 1.79 mg/dL — AB (ref 0.40–1.50)
Calcium: 10 mg/dL (ref 8.4–10.5)
GFR: 49.41 mL/min — AB (ref >60.00)
Glucose, Bld: 95 mg/dL (ref 70–99)
Potassium: 3.7 mEq/L (ref 3.5–5.1)
Sodium: 139 mEq/L (ref 135–145)

## 2014-01-29 MED ORDER — SERTRALINE HCL 50 MG PO TABS: 50.0000 mg | ORAL_TABLET | Freq: Every day | ORAL | Status: DC

## 2014-01-29 MED ORDER — ALPRAZOLAM 0.5 MG PO TABS: 0.5000 mg | ORAL_TABLET | Freq: Three times a day (TID) | ORAL | Status: DC | PRN

## 2014-01-29 NOTE — Progress Notes (Signed)
Pre visit review using our clinic review tool, if applicable. No additional management support is needed unless otherwise documented below in the visit note. 

## 2014-01-29 NOTE — Progress Notes (Signed)
Subjective:    Patient ID: [name], male    DOB: Feb 01, 1950, 64 y.o.   MRN: [id number]  HPI  Patient here for hospital f/u for chf and infection in picc line- GNR.  Pt is feeling much better since d/c.  Pt is struggling with anxiety secondary to home situation ---lives with mother, sister.  He has had family members tell him they wish he was dead. Pt is working with a SW to find a new place to live.    Past Medical History  Diagnosis Date  . CHF (congestive heart failure)   . Sarcoidosis   . Cardiomyopathy, dilated, nonischemic     non ischemic by cath  . Acute on chronic systolic heart failure   . Automatic implantable cardiac defibrillator in situ   . Atrial fibrillation   . NSVT (nonsustained ventricular tachycardia)   . GERD (gastroesophageal reflux disease)   . Hypercholesteremia   . Shortness of breath   . Chronic kidney disease (CKD), stage III (moderate)   . Pacemaker   . Anginal pain   . Gout   . Hypertension     dr [name]  . Coronary artery disease   . Elevated PSA 06/24/2013  . Myocardial infarction   . Headache   . Neuropathy     Review of Systems  Constitutional: Negative for fatigue and unexpected weight change.  Respiratory: Negative for cough and shortness of breath.   Cardiovascular: Negative for chest pain, palpitations and leg swelling.  Genitourinary: Negative for dysuria, urgency, frequency, flank pain and difficulty urinating.  Psychiatric/Behavioral: Positive for sleep disturbance and dysphoric mood. Negative for suicidal ideas, behavioral problems, confusion, self-injury, decreased concentration and agitation. The patient is nervous/anxious.        Objective:    Physical Exam  BP 125/91 mmHg  Pulse 69  Temp(Src) 98.1 F (36.7 C) (Oral)  Wt 246 lb (111.585 kg)  SpO2 96% Wt Readings from Last 3 Encounters:  01/29/14 246 lb (111.585 kg)  01/21/14 243 lb 4 oz (110.337 kg)  01/15/14 240 lb 1.3 oz (108.9 kg)     Lab Results    Component Value Date   WBC 7.8 01/09/2014   HGB 12.4* 01/09/2014   HCT 36.8* 01/09/2014   PLT 141* 01/09/2014   GLUCOSE 128* 01/15/2014   CHOL 171 06/23/2013   TRIG 188* 06/23/2013   HDL 63 06/23/2013   LDLCALC 70 06/23/2013   ALT 23 01/09/2014   AST 35 01/09/2014   NA 139 01/15/2014   K 4.2 01/15/2014   CL 100 01/15/2014   CREATININE 1.99* 01/15/2014   BUN 24* 01/15/2014   CO2 23 01/15/2014   TSH 3.970 10/27/2013   PSA 7.27* 06/23/2013   INR 1.07 11/27/2013   HGBA1C 6.5 10/08/2013    No results found.     Assessment & Plan:   Problem List Items Addressed This Visit    Acute on chronic systolic CHF (congestive heart failure), NYHA class 3           Chronic systolic CHF (congestive heart failure), NYHA class 3-4    Per cardio con't meds      Depression with anxiety    D/c ativan--- not effective D/c lexapro-- pt not taking because it made him sick Start zoloft --- 50 mg 1/2 po qd for 1 week then 1 po qd Xanax prn rto 1 month or sooner prn      Gram-negative bacteremia    Finished abx last night  Other Visit Diagnoses    Anxiety disorder, unspecified anxiety disorder type    -  Primary    Relevant Medications    sertraline (ZOLOFT) tablet    ALPRAZolam Duanne Moron) tablet        Garnet Koyanagi, DO

## 2014-01-29 NOTE — Assessment & Plan Note (Signed)
Finished abx last night

## 2014-01-29 NOTE — Patient Instructions (Signed)
Generalized Anxiety Disorder Generalized anxiety disorder (GAD) is a mental disorder. It interferes with life functions, including relationships, work, and school. GAD is different from normal anxiety, which everyone experiences at some point in their lives in response to specific life events and activities. Normal anxiety actually helps us prepare for and get through these life events and activities. Normal anxiety goes away after the event or activity is over.  GAD causes anxiety that is not necessarily related to specific events or activities. It also causes excess anxiety in proportion to specific events or activities. The anxiety associated with GAD is also difficult to control. GAD can vary from mild to severe. People with severe GAD can have intense waves of anxiety with physical symptoms (panic attacks).  SYMPTOMS The anxiety and worry associated with GAD are difficult to control. This anxiety and worry are related to many life events and activities and also occur more days than not for 6 months or longer. People with GAD also have three or more of the following symptoms (one or more in children):  Restlessness.   Fatigue.  Difficulty concentrating.   Irritability.  Muscle tension.  Difficulty sleeping or unsatisfying sleep. DIAGNOSIS GAD is diagnosed through an assessment by your health care provider. Your health care provider will ask you questions aboutyour mood,physical symptoms, and events in your life. Your health care provider may ask you about your medical history and use of alcohol or drugs, including prescription medicines. Your health care provider may also do a physical exam and blood tests. Certain medical conditions and the use of certain substances can cause symptoms similar to those associated with GAD. Your health care provider may refer you to a mental health specialist for further evaluation. TREATMENT The following therapies are usually used to treat GAD:    Medication. Antidepressant medication usually is prescribed for long-term daily control. Antianxiety medicines may be added in severe cases, especially when panic attacks occur.   Talk therapy (psychotherapy). Certain types of talk therapy can be helpful in treating GAD by providing support, education, and guidance. A form of talk therapy called cognitive behavioral therapy can teach you healthy ways to think about and react to daily life events and activities.  Stress managementtechniques. These include yoga, meditation, and exercise and can be very helpful when they are practiced regularly. A mental health specialist can help determine which treatment is best for you. Some people see improvement with one therapy. However, other people require a combination of therapies. Document Released: 04/15/2012 Document Revised: 05/05/2013 Document Reviewed: 04/15/2012 Exit[employer] Patient Information 2015 [employer]. This information is not intended to replace advice given to you by your health care provider. Make sure you discuss any questions you have with your health care provider.  

## 2014-01-29 NOTE — Assessment & Plan Note (Signed)
D/c ativan--- not effective D/c lexapro-- pt not taking because it made him sick Start zoloft --- 50 mg 1/2 po qd for 1 week then 1 po qd Xanax prn rto 1 month or sooner prn

## 2014-01-29 NOTE — Assessment & Plan Note (Signed)
Per cardio con't meds

## 2014-02-04 DIAGNOSIS — Z95810 Presence of automatic (implantable) cardiac defibrillator: Secondary | ICD-10-CM | POA: Diagnosis not present

## 2014-02-04 DIAGNOSIS — N184 Chronic kidney disease, stage 4 (severe): Secondary | ICD-10-CM | POA: Diagnosis not present

## 2014-02-04 DIAGNOSIS — I5022 Chronic systolic (congestive) heart failure: Secondary | ICD-10-CM | POA: Diagnosis not present

## 2014-02-04 DIAGNOSIS — I429 Cardiomyopathy, unspecified: Secondary | ICD-10-CM | POA: Diagnosis not present

## 2014-02-05 ENCOUNTER — Telehealth: Payer: Self-pay | Admitting: Family

## 2014-02-05 DIAGNOSIS — I509 Heart failure, unspecified: Secondary | ICD-10-CM | POA: Diagnosis not present

## 2014-02-05 DIAGNOSIS — I5022 Chronic systolic (congestive) heart failure: Secondary | ICD-10-CM | POA: Diagnosis not present

## 2014-02-05 DIAGNOSIS — R0602 Shortness of breath: Secondary | ICD-10-CM | POA: Diagnosis not present

## 2014-02-05 NOTE — Telephone Encounter (Signed)
Please advise 

## 2014-02-05 NOTE — Telephone Encounter (Signed)
Caller name: Arrie Senate Relation to pt: [place]  Call back number: [phone]    Reason for call:  As per RN from [facility] pt was seen today, pt is experiencing head ache due to tooth ace. RN suggested a referral to a dentist please advise

## 2014-02-06 NOTE — Telephone Encounter (Signed)
Yes, pt should contact his dentist.  If he does not have a dentist he will need to obtain one.  We generally do not make dental referrals- pt makes these appointments.

## 2014-02-06 NOTE — Telephone Encounter (Signed)
Notified pt and he voices understanding. 

## 2014-02-09 ENCOUNTER — Encounter: Payer: Self-pay | Admitting: Gastroenterology

## 2014-02-09 ENCOUNTER — Telehealth (HOSPITAL_COMMUNITY): Payer: Self-pay | Admitting: *Deleted

## 2014-02-09 ENCOUNTER — Ambulatory Visit (INDEPENDENT_AMBULATORY_CARE_PROVIDER_SITE_OTHER): Payer: Medicare Other | Admitting: Gastroenterology

## 2014-02-09 VITALS — BP 100/70 | HR 68 | Ht 75.0 in | Wt 244.0 lb

## 2014-02-09 DIAGNOSIS — R1013 Epigastric pain: Secondary | ICD-10-CM

## 2014-02-09 MED ORDER — POTASSIUM CHLORIDE CRYS ER 20 MEQ PO TBCR: 40.0000 meq | EXTENDED_RELEASE_TABLET | Freq: Every day | ORAL | Status: DC

## 2014-02-09 NOTE — Telephone Encounter (Signed)
Received labs from [facility] bun 49, cr 2.18, K 3.3 per Darrick Grinder, NP hold Torsemide for 1 day, increase KCL to 40 meq daily, pt aware and agreeable

## 2014-02-09 NOTE — Progress Notes (Signed)
Review of pertinent gastrointestinal problems: 1. Intermittent vomiting, abdominal pain; abdominal ultrasound in August 2015, which showed suggestion of mild gallbladder sludge, mild hepatic steatosis without focal mass, and subcentimeter left renal cyst, but was otherwise unremarkable. He had an EGD in June of 2012 by Dr. Michail Sermon for evaluation of epigastric abdominal pain that was normal at that time. Recent CBC, lipase, and hepatic function panel were within normal limits. He had a CT scan in January 2014 without contrast for evaluation of epigastric abdominal pain; He was set to have colonoscopy, EGD 09/2013 Dr. Ardis Hughs at [facility] but presented after his prep hypotensive with HR 110-130s. Procedure was cancelled. 10/2013 decided to treat GI symptoms empirically with gas-ex, H2 blocker 2. Chronic systolic heart failure IIIB (per cardiology note 10/2013) despite 24 hour continuous inotropic support EF 20-25%.   HPI: This is a very pleasant 64 year old man whom I last saw about 3 months ago.  Since last office visit he was admitted with PICC line associated GN bactermia, new picc line placed.  While admitted he had Vt/Vf per cards note.  Was in hosp for 8 days.  He is off abx for about a week now.  GI wise he had been feeling well on once to twice daily H2 blocker and when necessary Gas-X.  Was having dental issues, started taking about 10 ASA per day for about 3 days.  Had a lot of stomach upset quickly and so he stopped the ASA and now stomach is feeling feeling.  He's been working on getting a Pharmacist, community.  Past Medical History  Diagnosis Date  . CHF (congestive heart failure)   . Sarcoidosis   . Cardiomyopathy, dilated, nonischemic     non ischemic by cath  . Acute on chronic systolic heart failure   . Automatic implantable cardiac defibrillator in situ   . Atrial fibrillation   . NSVT (nonsustained ventricular tachycardia)   . GERD (gastroesophageal reflux disease)   . Hypercholesteremia    . Shortness of breath   . Chronic kidney disease (CKD), stage III (moderate)   . Pacemaker   . Anginal pain   . Gout   . Hypertension     dr [name]  . Coronary artery disease   . Elevated PSA 06/24/2013  . Myocardial infarction   . Headache   . Neuropathy     Past Surgical History  Procedure Laterality Date  . Pacemaker insertion  2009    with ICD  . Tee without cardioversion  01/17/2011    Procedure: TRANSESOPHAGEAL ECHOCARDIOGRAM (TEE);  Surgeon: Birdie Riddle, MD;  Location: [place];  Service: Cardiovascular;  Laterality: N/A;  . Cardioversion  01/17/2011    Procedure: CARDIOVERSION;  Surgeon: Birdie Riddle, MD;  Location: [place];  Service: Cardiovascular;  Laterality: N/A;  . Cardiac catheterization    . Insert / replace / remove pacemaker    . Anterior cervical decomp/discectomy fusion  08/21/2011    Procedure: ANTERIOR CERVICAL DECOMPRESSION/DISCECTOMY FUSION 2 LEVELS;  Surgeon: Marybelle Killings, MD;  Location: [place];  Service: Orthopedics;  Laterality: N/A;  C5-6, C6-7 Anterior Cervical Discectomy and Fusion, allograft, plate  . Tee without cardioversion N/A 02/16/2012    Procedure: TRANSESOPHAGEAL ECHOCARDIOGRAM (TEE);  Surgeon: Jolaine Artist, MD;  Location: [facility] ENDOSCOPY;  Service: Cardiovascular;  Laterality: N/A;  original case scheduled under his dad (who is deceased), rescheduled under correct mrn/pt/dob. [place]/dl  . Tee without cardioversion N/A 03/22/2012    Procedure: TRANSESOPHAGEAL ECHOCARDIOGRAM (TEE);  Surgeon: Denice Bors  Stanford Breed, MD;  Location: [place];  Service: Cardiovascular;  Laterality: N/A;  . Cardioversion N/A 03/22/2012    Procedure: CARDIOVERSION;  Surgeon: Lelon Perla, MD;  Location: [facility] ENDOSCOPY;  Service: Cardiovascular;  Laterality: N/A;  . Cardioversion N/A 04/26/2012    Procedure: CARDIOVERSION;  Surgeon: Jolaine Artist, MD;  Location: [facility] ENDOSCOPY;  Service: Cardiovascular;  Laterality: N/A;  . Central venous catheter tunneled  insertion single lumen  09/16/2012    right IJ  . Cardioversion N/A 11/08/2012    Procedure: CARDIOVERSION;  Surgeon: Jolaine Artist, MD;  Location: [facility] ENDOSCOPY;  Service: Cardiovascular;  Laterality: N/A;  . Back surgery  1987, lower    Ruptured disk repair  . Peripherally inserted central catheter insertion  2013  . Right heart catheterization N/A 01/26/2012    Procedure: RIGHT HEART CATH;  Surgeon: Jolaine Artist, MD;  Location: [facility] CATH LAB;  Service: Cardiovascular;  Laterality: N/A;  . Right heart catheterization N/A 04/30/2013    Procedure: RIGHT HEART CATH;  Surgeon: Jolaine Artist, MD;  Location: [facility] CATH LAB;  Service: Cardiovascular;  Laterality: N/A;    Current Outpatient Prescriptions  Medication Sig Dispense Refill  . allopurinol (ZYLOPRIM) 100 MG tablet Take 1 tablet (100 mg total) by mouth daily. 30 tablet 6  . ALPRAZolam (XANAX) 0.5 MG tablet Take 1 tablet (0.5 mg total) by mouth 3 (three) times daily as needed for anxiety. 60 tablet 0  . amiodarone (PACERONE) 200 MG tablet Take 1 tablet (200 mg total) by mouth 2 (two) times daily. 60 tablet 6  . apixaban (ELIQUIS) 5 MG TABS tablet Take 1 tablet (5 mg total) by mouth 2 (two) times daily. 60 tablet 4  . carvedilol (COREG) 3.125 MG tablet Take 1 tablet (3.125 mg total) by mouth 2 (two) times daily with a meal. 60 tablet 6  . diphenhydrAMINE (BENADRYL) 50 MG capsule Take 50 mg by mouth daily as needed for itching.    Marland Kitchen eplerenone (INSPRA) 25 MG tablet Take 12.5 mg by mouth every other day.     . escitalopram (LEXAPRO) 10 MG tablet   0  . esomeprazole (NEXIUM) 40 MG capsule Take 40 mg by mouth 2 (two) times daily as needed (reflux).     . fluticasone (FLONASE) 50 MCG/ACT nasal spray Place 2 sprays into both nostrils daily as needed for allergies. 16 g 2  . Gabapentin Enacarbil ER (HORIZANT) 300 MG TBCR Take 300 mg by mouth at bedtime as needed (pain).     . hydrOXYzine (ATARAX/VISTARIL) 25 MG tablet Take 1 tablet (25 mg  total) by mouth 2 (two) times daily as needed. 60 tablet 2  . LORazepam (ATIVAN) 0.5 MG tablet Take 1 tablet (0.5 mg total) by mouth every 8 (eight) hours as needed for anxiety. (Patient taking differently: Take 0.5-1 mg by mouth 2 (two) times daily as needed for anxiety. ) 15 tablet 0  . Magnesium Oxide 400 (240 MG) MG TABS Take 1 tablet by mouth 2 (two) times daily. (Patient taking differently: Take 400 mg by mouth daily. ) 60 tablet 3  . metolazone (ZAROXOLYN) 2.5 MG tablet Take 1 tablet (2.5 mg total) by mouth daily as needed (for fluid retention). 10 tablet 6  . milrinone (PRIMACOR) 20 MG/100ML SOLN infusion Inject 0.375 mcg/kg/min into the vein continuous. 440mg  in 420ml infusion 2.5 ml per hour    . Oxycodone HCl 20 MG TABS Take 1 tablet (20 mg total) by mouth 3 (three) times daily as needed (pain.). (  Patient taking differently: Take 20 mg by mouth 3 (three) times daily as needed (pain.). ) 15 tablet 0  . potassium chloride SA (K-DUR,KLOR-CON) 20 MEQ tablet Take 1 tablet (20 mEq total) by mouth daily. 60 tablet 6  . promethazine (PHENERGAN) 25 MG tablet Take 25 mg by mouth every 6 (six) hours as needed for nausea or vomiting.    . ranitidine (ZANTAC) 150 MG capsule Take 150 mg by mouth daily as needed for heartburn (reflux).    Marland Kitchen senna-docusate (SENOKOT-S) 8.6-50 MG per tablet Take 1-2 tablets by mouth 2 (two) times daily as needed for mild constipation or moderate constipation. 60 tablet 3  . sertraline (ZOLOFT) 50 MG tablet Take 1 tablet (50 mg total) by mouth daily. 30 tablet 3  . sucralfate (CARAFATE) 1 GM/10ML suspension Take 10 mLs (1 g total) by mouth 4 (four) times daily. (Patient taking differently: Take 1 g by mouth 2 (two) times daily. ) 420 mL 1  . torsemide (DEMADEX) 20 MG tablet Take 2 tablets (40 mg total) by mouth daily. (Patient taking differently: Take 60 mg in AM and 40 mg in PM) 60 tablet 6  . zolpidem (AMBIEN) 5 MG tablet Take 1.5 tablets (7.5 mg total) by mouth at bedtime.  45 tablet 0   No current facility-administered medications for this visit.    Allergies as of 02/09/2014 - Review Complete 02/09/2014  Allergen Reaction Noted  . Nitroglycerin Other (See Comments) 11/16/2012  . Colchicine Nausea Only 12/17/2013    Family History  Problem Relation Age of Onset  . Heart disease    . Heart failure    . Stroke    . Anesthesia problems Neg Hx   . Hypotension Neg Hx   . Malignant hyperthermia Neg Hx   . Pseudochol deficiency Neg Hx     History   Social History  . Marital Status: Single    Spouse Name: N/A    Number of Children: N/A  . Years of Education: N/A   Occupational History  . Not on file.   Social History Main Topics  . Smoking status: Never Smoker   . Smokeless tobacco: Never Used  . Alcohol Use: No  . Drug Use: Yes    Special: "Crack" cocaine, Cocaine     Comment: last use august 2004  . Sexual Activity: Not Currently   Other Topics Concern  . Not on file   Social History Narrative   3 sons- one in [place], 1 in [place], on in [place]   Retired from post office- on disability   Completed Bachelors from [employer]      Physical Exam: BP 100/70 mmHg  Pulse 68  Ht 6\' 3"  (1.905 m)  Wt 244 lb (110.678 kg)  BMI 30.50 kg/m2 Constitutional: generally well-appearing Psychiatric: alert and oriented x3 Abdomen: soft, nontender, nondistended, no obvious ascites, no peritoneal signs, normal bowel sounds     Assessment and plan: 64 y.o. male with severe CHF, chronic abdominal discomforts  His abdominal discomforts have been getting quite a bit better on once to twice daily H2 blocker with as needed Gas-X. Took a turn for the worse with severe dyspepsia after he started taking 10-12 aspirin a day recently for dental pain. After just 3 or 4 days of this extremely high-dose aspirin he started having terrible abdominal pains and he stop the aspirin and his pains have nearly resolved already. He is searching for a dentist and is likely going [facility]  [facility]. He will continue  once to twice daily H2 blockers and as needed Gas-X and he'll return to see me on an as-needed basis.

## 2014-02-09 NOTE — Patient Instructions (Signed)
Continue your zantac once to twice daily. Refrain from too much aspirin use. Return as needed.

## 2014-02-10 ENCOUNTER — Telehealth (HOSPITAL_COMMUNITY): Payer: Self-pay | Admitting: *Deleted

## 2014-02-10 NOTE — Telephone Encounter (Signed)
Called [facility] for PA on Eliquis, med was approved through 02/11/15, confirmation # [id number]

## 2014-02-11 ENCOUNTER — Encounter (HOSPITAL_COMMUNITY): Payer: Medicare Other

## 2014-02-12 DIAGNOSIS — R0602 Shortness of breath: Secondary | ICD-10-CM | POA: Diagnosis not present

## 2014-02-12 DIAGNOSIS — I509 Heart failure, unspecified: Secondary | ICD-10-CM | POA: Diagnosis not present

## 2014-02-19 DIAGNOSIS — I509 Heart failure, unspecified: Secondary | ICD-10-CM | POA: Diagnosis not present

## 2014-02-19 DIAGNOSIS — R0602 Shortness of breath: Secondary | ICD-10-CM | POA: Diagnosis not present

## 2014-02-20 DIAGNOSIS — M5137 Other intervertebral disc degeneration, lumbosacral region: Secondary | ICD-10-CM | POA: Diagnosis not present

## 2014-02-20 DIAGNOSIS — I5022 Chronic systolic (congestive) heart failure: Secondary | ICD-10-CM | POA: Diagnosis not present

## 2014-02-20 DIAGNOSIS — M545 Low back pain: Secondary | ICD-10-CM | POA: Diagnosis not present

## 2014-02-20 DIAGNOSIS — Z79899 Other long term (current) drug therapy: Secondary | ICD-10-CM | POA: Diagnosis not present

## 2014-02-20 DIAGNOSIS — M47817 Spondylosis without myelopathy or radiculopathy, lumbosacral region: Secondary | ICD-10-CM | POA: Diagnosis not present

## 2014-02-20 DIAGNOSIS — G894 Chronic pain syndrome: Secondary | ICD-10-CM | POA: Diagnosis not present

## 2014-02-20 DIAGNOSIS — M79606 Pain in leg, unspecified: Secondary | ICD-10-CM | POA: Diagnosis not present

## 2014-02-24 ENCOUNTER — Encounter: Payer: Self-pay | Admitting: Internal Medicine

## 2014-02-24 ENCOUNTER — Ambulatory Visit (HOSPITAL_COMMUNITY)
Admission: RE | Admit: 2014-02-24 | Discharge: 2014-02-24 | Disposition: A | Discharge status: Home / Self Care | Payer: Medicare Other | Source: Ambulatory Visit | Attending: Cardiology | Admitting: Cardiology

## 2014-02-24 VITALS — BP 120/74 | HR 74 | Wt 247.0 lb

## 2014-02-24 DIAGNOSIS — R7881 Bacteremia: Secondary | ICD-10-CM | POA: Insufficient documentation

## 2014-02-24 DIAGNOSIS — I480 Paroxysmal atrial fibrillation: Secondary | ICD-10-CM | POA: Diagnosis not present

## 2014-02-24 DIAGNOSIS — I5022 Chronic systolic (congestive) heart failure: Secondary | ICD-10-CM | POA: Diagnosis not present

## 2014-02-24 DIAGNOSIS — A4150 Gram-negative sepsis, unspecified: Secondary | ICD-10-CM | POA: Insufficient documentation

## 2014-02-24 DIAGNOSIS — N184 Chronic kidney disease, stage 4 (severe): Secondary | ICD-10-CM | POA: Insufficient documentation

## 2014-02-24 DIAGNOSIS — G8929 Other chronic pain: Secondary | ICD-10-CM | POA: Diagnosis not present

## 2014-02-24 DIAGNOSIS — I498 Other specified cardiac arrhythmias: Secondary | ICD-10-CM | POA: Insufficient documentation

## 2014-02-24 MED ORDER — TORSEMIDE 20 MG PO TABS: 40.0000 mg | ORAL_TABLET | Freq: Every day | ORAL | Status: DC

## 2014-02-24 NOTE — Patient Instructions (Signed)
Follow-up in 6 weeks

## 2014-02-24 NOTE — Progress Notes (Signed)
Patient ID: [name], male   DOB: 05/30/1950, 64 y.o.   MRN: [id number] PCP: Debbrah Alar   HPI: Mr. Manning is a 64 y.o. gentlemen with severe HF due to NICM (EF 20-25%) with multiple hospital admissions for HF exacerbations. He also has history of ventricular tachycardia s/p St Jude ICD by Dr. Lovena Le, CKD: stage IV, and paroxysmal atrial arrhythmias on amiodarone as well as sarcoidosis and hypertension. Cath in 2008 by Dr. Terrence Dupont showed no CAD.Seen at [facility] and not felt to be a transplant candidate due to renal failure and lack of family support.  Milrinone initiated in January 2014 for low output. 03/01/12 milrinone increased 0.365mc/kg/min.   Admitted to [facility] 01/09/14-01/15/2014. with an infected PICC. Gram negative bacteremia with antibiotic course. Hospitalization was complicated by VT/VF. Treated with Discharge weight was 240 pounds.   He returns for follow up. Overall feeling great.  Denies dyspnea/pnd/orthopnea. Says he is having more good days then bad.  Weight at home 239-241pounds. Has [facility] for  Milrinone and monthly lab work.  Limiting fluid intake to less 2 liters. No bleeding problems.    ECHO 03/22/12 EF 15%  Echo 6/15: 20-25%  04/26/12 S/P Successful DC-CV for atrial fibrillation. Remains on amiodarone.   RHC 04/30/13 RA = 4  RV = 20/1/3  PA = 23/8 (15)  PCW = 5  Fick cardiac output/index =4.5/1.9  Thermo CO/CI = 3.9/1.6  PVR =  FA sat = 98%  PA sat = 62%, 64% - On milrinone 0.25  He is not on an ace inhibitor due to elevated creatinine. Not on nitrates due to Viagra use.   Labs:  08/15/12 Creatinine 4.24 Potassium 3.3 09/19/12 Creatinine 3.98, Potassium 3.1, BUN 78 10/07/12 Creatinine 2.62, Potassium 3.4 10/18/12 Creatinine 3.3, K+3.1 11/08/12 Creatinine 2.15 K 4.0 11/25/12 Creatinine 2.93, K+ 3.5 03/10/13: K+ 2.9, Creatinine 2.63 04/30/13: K+ 3.7, creatinine 2.4 6/15: K 3, creatinine 3.6 09/11/13: K 3.1 Cr 1.87  10/15: K 3.8, creatinine 2.13, HCT 40.7 11/06/13 K  4.0 Creatinine 1.58  12/17/13 K 3.6 Creatinine 2.3   ROS: All systems negative except as listed in HPI, PMH and Problem List.  Past Medical History  Diagnosis Date  . CHF (congestive heart failure)   . Sarcoidosis   . Cardiomyopathy, dilated, nonischemic     non ischemic by cath  . Acute on chronic systolic heart failure   . Automatic implantable cardiac defibrillator in situ   . Atrial fibrillation   . NSVT (nonsustained ventricular tachycardia)   . GERD (gastroesophageal reflux disease)   . Hypercholesteremia   . Shortness of breath   . Chronic kidney disease (CKD), stage III (moderate)   . Pacemaker   . Anginal pain   . Gout   . Hypertension     dr [name]  . Coronary artery disease   . Elevated PSA 06/24/2013  . Myocardial infarction   . Headache   . Neuropathy    Current Outpatient Prescriptions  Medication Sig Dispense Refill  . allopurinol (ZYLOPRIM) 100 MG tablet Take 1 tablet (100 mg total) by mouth daily. 30 tablet 6  . ALPRAZolam (XANAX) 0.5 MG tablet Take 1 tablet (0.5 mg total) by mouth 3 (three) times daily as needed for anxiety. 60 tablet 0  . amiodarone (PACERONE) 200 MG tablet Take 1 tablet (200 mg total) by mouth 2 (two) times daily. 60 tablet 6  . apixaban (ELIQUIS) 5 MG TABS tablet Take 1 tablet (5 mg total) by mouth 2 (two) times daily.  60 tablet 4  . carvedilol (COREG) 3.125 MG tablet Take 1 tablet (3.125 mg total) by mouth 2 (two) times daily with a meal. 60 tablet 6  . diphenhydrAMINE (BENADRYL) 50 MG capsule Take 50 mg by mouth daily as needed for itching.    Marland Kitchen eplerenone (INSPRA) 25 MG tablet Take 12.5 mg by mouth every other day.     . escitalopram (LEXAPRO) 10 MG tablet   0  . esomeprazole (NEXIUM) 40 MG capsule Take 40 mg by mouth 2 (two) times daily as needed (reflux).     . fluticasone (FLONASE) 50 MCG/ACT nasal spray Place 2 sprays into both nostrils daily as needed for allergies. 16 g 2  . Gabapentin Enacarbil ER (HORIZANT) 300 MG TBCR Take  300 mg by mouth at bedtime as needed (pain).     . hydrOXYzine (ATARAX/VISTARIL) 25 MG tablet Take 1 tablet (25 mg total) by mouth 2 (two) times daily as needed. 60 tablet 2  . LORazepam (ATIVAN) 0.5 MG tablet Take 1 tablet (0.5 mg total) by mouth every 8 (eight) hours as needed for anxiety. (Patient taking differently: Take 0.5-1 mg by mouth 2 (two) times daily as needed for anxiety. ) 15 tablet 0  . Magnesium Oxide 400 (240 MG) MG TABS Take 1 tablet by mouth 2 (two) times daily. (Patient taking differently: Take 400 mg by mouth daily. ) 60 tablet 3  . metolazone (ZAROXOLYN) 2.5 MG tablet Take 1 tablet (2.5 mg total) by mouth daily as needed (for fluid retention). 10 tablet 6  . milrinone (PRIMACOR) 20 MG/100ML SOLN infusion Inject 0.375 mcg/kg/min into the vein continuous. 440mg  in 485ml infusion 2.5 ml per hour    . Oxycodone HCl 20 MG TABS Take 1 tablet (20 mg total) by mouth 3 (three) times daily as needed (pain.). (Patient taking differently: Take 20 mg by mouth 3 (three) times daily as needed (pain.). ) 15 tablet 0  . potassium chloride SA (K-DUR,KLOR-CON) 20 MEQ tablet Take 2 tablets (40 mEq total) by mouth daily. 60 tablet 6  . promethazine (PHENERGAN) 25 MG tablet Take 25 mg by mouth every 6 (six) hours as needed for nausea or vomiting.    . ranitidine (ZANTAC) 150 MG capsule Take 150 mg by mouth daily as needed for heartburn (reflux).    Marland Kitchen senna-docusate (SENOKOT-S) 8.6-50 MG per tablet Take 1-2 tablets by mouth 2 (two) times daily as needed for mild constipation or moderate constipation. 60 tablet 3  . sertraline (ZOLOFT) 50 MG tablet Take 1 tablet (50 mg total) by mouth daily. 30 tablet 3  . sucralfate (CARAFATE) 1 GM/10ML suspension Take 10 mLs (1 g total) by mouth 4 (four) times daily. (Patient taking differently: Take 1 g by mouth 2 (two) times daily. ) 420 mL 1  . torsemide (DEMADEX) 20 MG tablet Take 2 tablets (40 mg total) by mouth daily. (Patient taking differently: Take 60 mg in AM  and 40 mg in PM) 60 tablet 6  . zolpidem (AMBIEN) 5 MG tablet Take 1.5 tablets (7.5 mg total) by mouth at bedtime. 45 tablet 0   No current facility-administered medications for this encounter.   Filed Vitals:   02/24/14 0915  BP: 120/74  Pulse: 74  Weight: 247 lb (112.038 kg)  SpO2: 97%    PHYSICAL EXAM: General: NAD; Chronically ill appearing. Ambulated in the clinic without dyspnea.  HEENT: normal  Neck: supple. JVP flat. Carotids 2+ bilat; no bruits. No lymphadenopathy or thyromegaly appreciated.  Cor: PMI laterally  displaced. Regular S1 S2 with no S3 and 2/6 HSM apex. R upper chest hickman site with milrinone infusing.  Lungs: clear Abdomen: soft, nontender, obese. No bruits or masses. Good bowel sounds.  Mild abdominal distention.  Extremities: no cyanosis, clubbing, rash, no lower extremity edema  Neuro: alert & orientedx3, cranial nerves grossly intact. moves all 4 extremities w/o difficulty. Affect pleasant     ASSESSMENT & PLAN:   1) Chronic systolic HF: NICM, EF 32-91% (06/2013). St Jude ICD. He has been on home milrinone 0.375 mcg/kg/min since January 2014.  Marland Kitchen NYHA IIID.  Functionally doing well. Volume status stable. Continue current dose of torsemide 40 mg daily and inspra 12.5 mg daily and 40 meq potassium daily Continue carvedilol 3.125 mg twice a day.  - - No ACEI with CKD Continue monthly BMET and magnesium per [facility].   2) Atrial arrhythmias: Paroxysmal. Regular rhythm. On Eliquis. Continue low dose carvedilol 3.125 mg twice a day.    3) CKD, stage IV: Continue mont.   4) Chronic pain: Per pain clinic.  5) Infected PICC. -Gram negative bacteremia. He completed a 7 day course of antibiotics.  6) VT/VF during hospitalization 01/10/14 . On amiodarone 200 mg twice a day.  Will need TSH and LFTs next blood draw.   Follow up in 6 weeks.   Orvell Careaga NP-C  02/24/2014

## 2014-02-25 ENCOUNTER — Encounter: Payer: Self-pay | Admitting: Family

## 2014-02-26 DIAGNOSIS — I509 Heart failure, unspecified: Secondary | ICD-10-CM | POA: Diagnosis not present

## 2014-02-26 DIAGNOSIS — I5022 Chronic systolic (congestive) heart failure: Secondary | ICD-10-CM | POA: Diagnosis not present

## 2014-02-26 DIAGNOSIS — R0602 Shortness of breath: Secondary | ICD-10-CM | POA: Diagnosis not present

## 2014-02-26 DIAGNOSIS — I5023 Acute on chronic systolic (congestive) heart failure: Secondary | ICD-10-CM | POA: Diagnosis not present

## 2014-02-28 IMAGING — XA IR FLUORO GUIDE CV LINE*R*
1 series · 1 of 1 positions shown · non-contrast
Comparison: none

INDICATION: 62-year-old with chronic systolic heart failure and
requires continuousmilrinone infusion.  Recent infection of the
right arm PICC line and the catheter needs to be removed.  The
patient has a left-sided ICD.  After discussion with the patient,
the patient preferred to have a tunneled chest catheter.

[Series 1: fl cto · 1 of 1 slices shown]
[im 1/1]
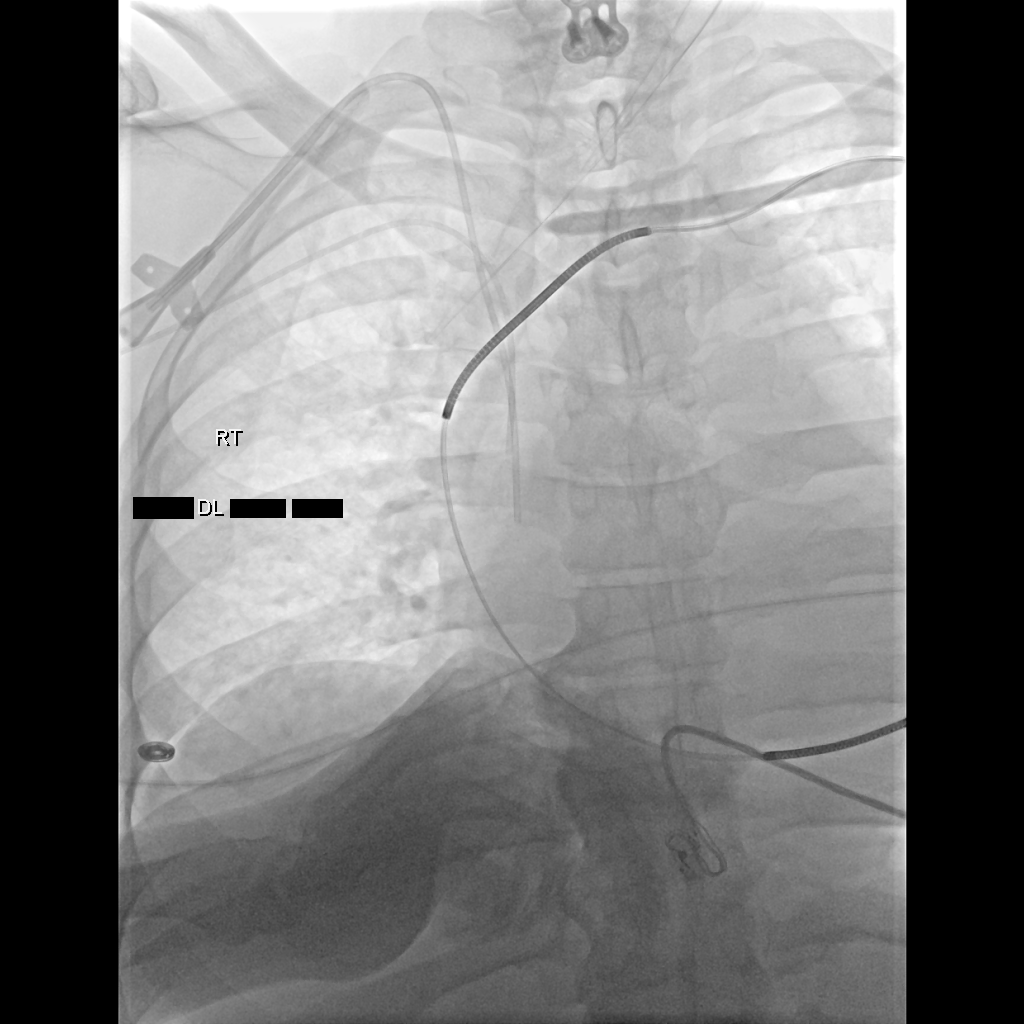

[1 of 1 positions shown; findings below may reference images not displayed]

PROCEDURE(S): FLUOROSCOPIC AND ULTRASOUND GUIDED PLACEMENT OF A
TUNNELED CENTRAL VENOUS CATHETER

Medications: Versed 1 mg, Fentanyl 50 mcg. A radiology nurse
monitored the patient for moderate sedation.

Moderate sedation time: 15 minutes

Fluoroscopy time: 24 seconds

Procedure:Informed consent was obtained for placement of a tunneled
central venous catheter.  The patient was placed supine on the
interventional table.  Ultrasound confirmed a patent right internal
jugularvein.  Ultrasound images were obtained for documentation.
The right side of the neck was prepped and draped in a sterile
fashion.  The right side of the neck was anesthetized with 1%
lidocaine.  Maximal barrier sterile technique was utilized
including caps, mask, sterile gowns, sterile gloves, sterile drape,
hand hygiene and skin antiseptic.  A small incision was made with
#11 blade scalpel.  A 21 gauge needle directed into the right
internal jugular vein with ultrasound guidance.  A micropuncture
dilator set was placed.  A dual lumen Powerline catheter was
selected.  The skin below the right clavicle was anesthetized and a
small incision was made with an #11 blade scalpel.  A subcutaneous
tunnel was formed to the vein dermatotomy site.  The catheter was
brought through the tunnel.  The vein dermatotomy site was dilated
to accommodate a peel-away sheath.  The catheter was placed through
the peel-away sheath and directed into the central venous
structures.  The tip of the catheter was placed in the lower SVC
with fluoroscopy.  Fluoroscopic images were obtained for
documentation.  Both lumens were found to aspirate and flush well.
The vein dermatotomy site was closed using Dermabond.  The catheter
was secured to the skin using Prolene suture.Fluoroscopic and
ultrasound images were taken and saved for documentation.
FINDINGS: Catheter tip in the lower SVC.

Complications: None
IMPRESSION: Successful placement of a right jugular tunneled
catheter using ultrasound and fluoroscopic guidance.

## 2014-03-03 IMAGING — CT CT HEAD W/O CM
1 series · 16 of 30 positions shown, 20 images · non-contrast
Comparison: 10/07/2011.

CLINICAL DATA: Chest pain

EXAM:
CT HEAD WITHOUT CONTRAST
TECHNIQUE: Contiguous axial images were obtained from the base of the skull
through the vertex without intravenous contrast.

[Series 2: head 5.0 h30s · axial · 0.49mm/px · z∈[+1346,+1481]mm · 16 of 31 slices shown, 20 images]
[im 2/31  brain]
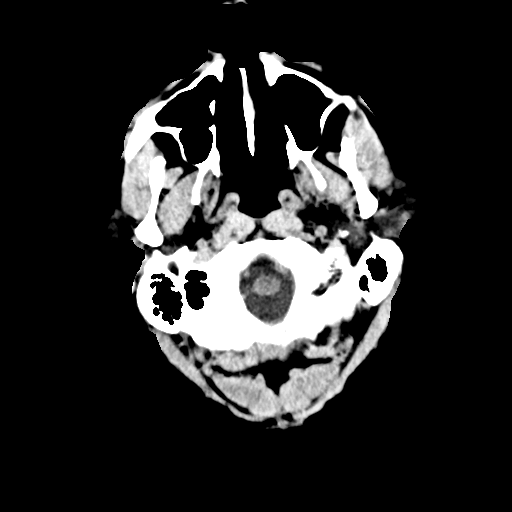
[im 2/31  bone]
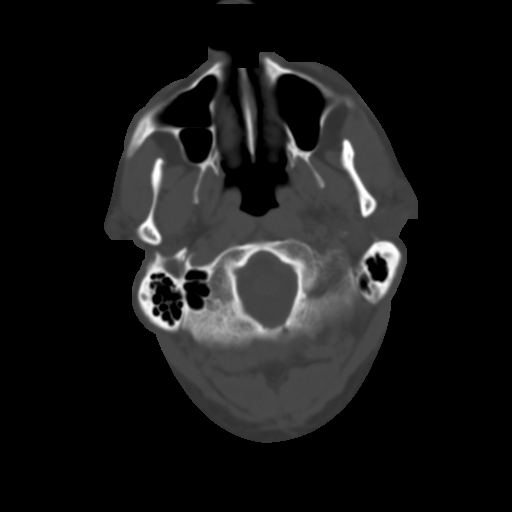
[im 4/31  brain]
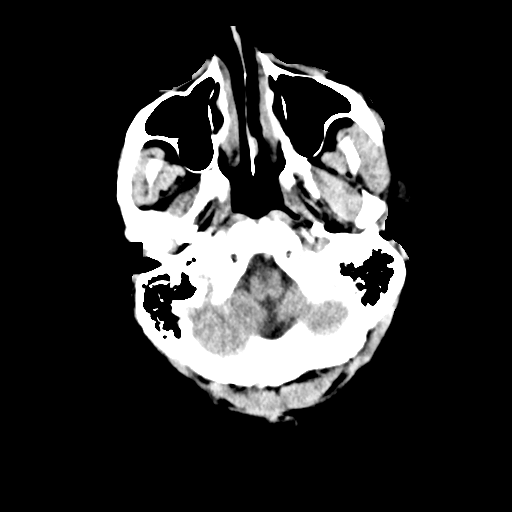
[im 6/31  brain]
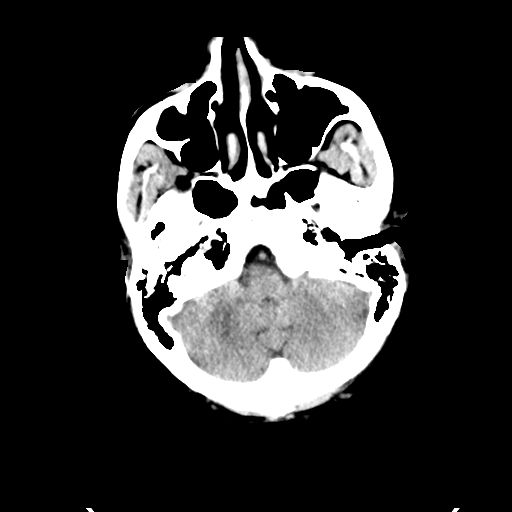
[im 8/31  brain]
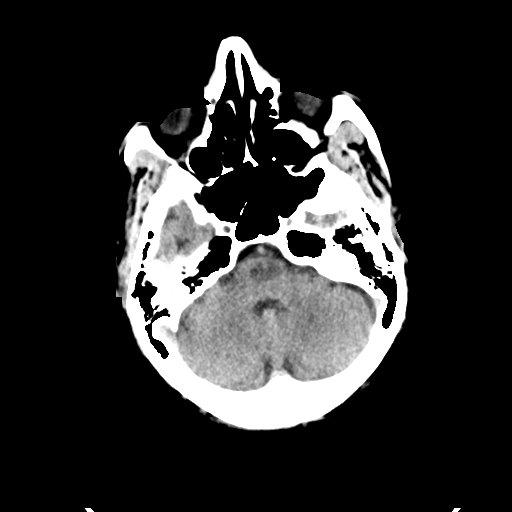
[im 9/31  brain]
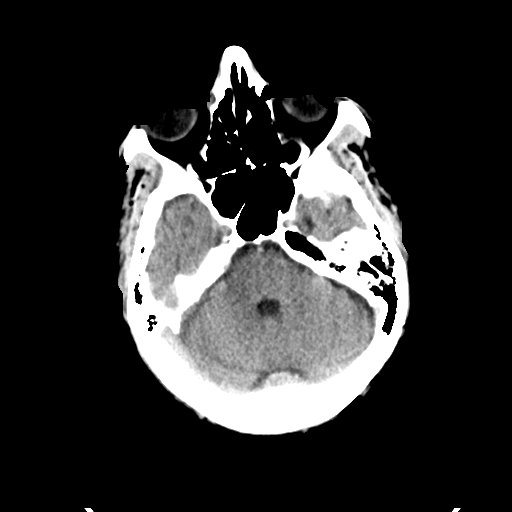
[im 9/31  bone]
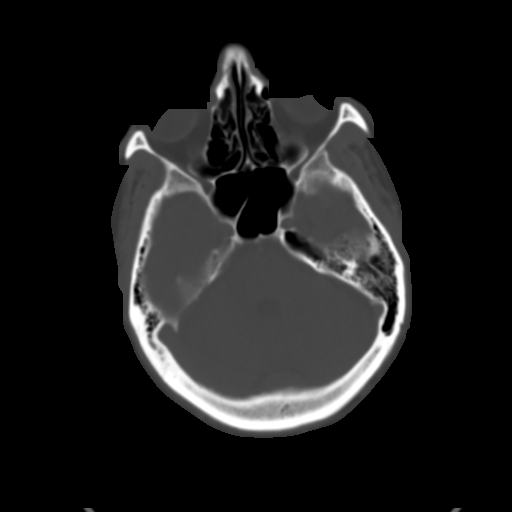
[im 11/31  brain]
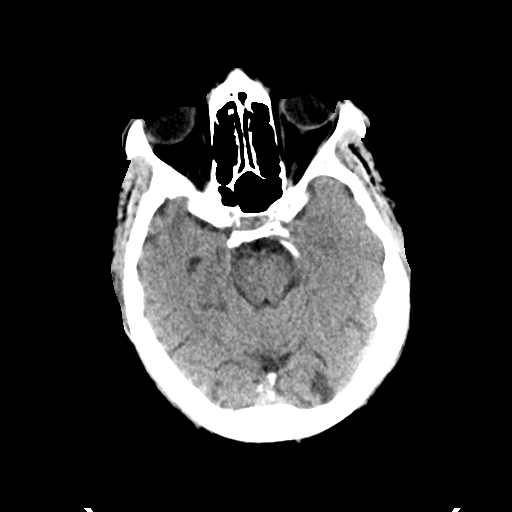
[im 13/31  brain]
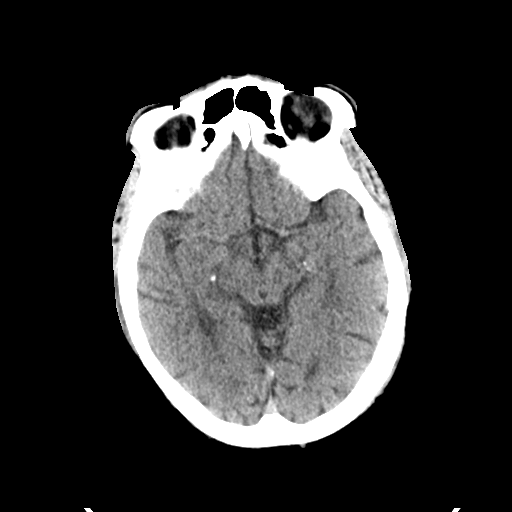
[im 15/31  brain]
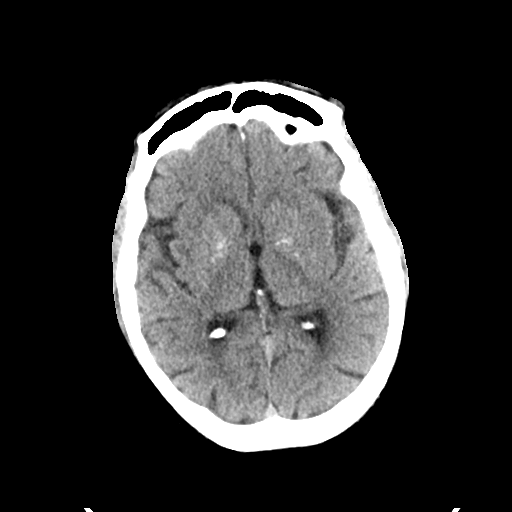
[im 16/31  brain]
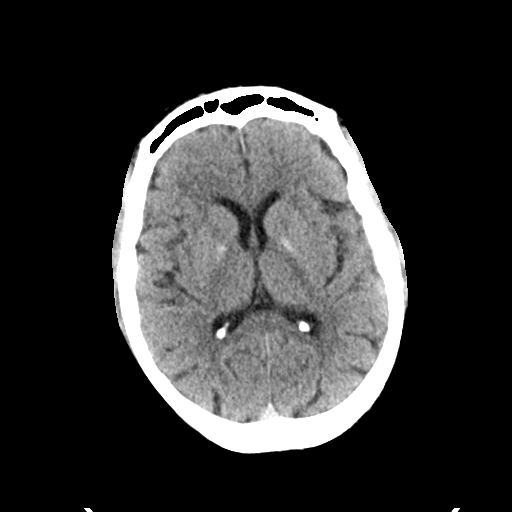
[im 16/31  bone]
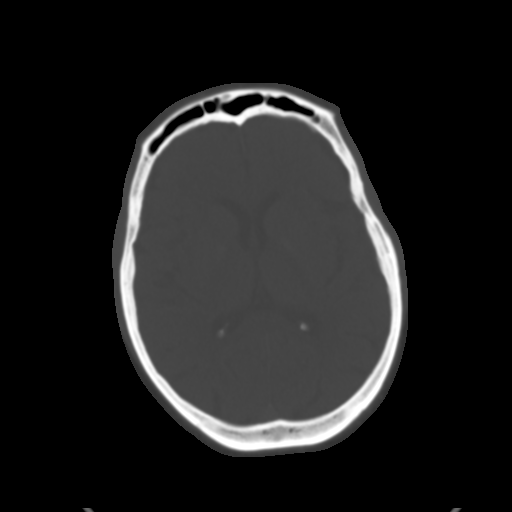
[im 18/31  brain]
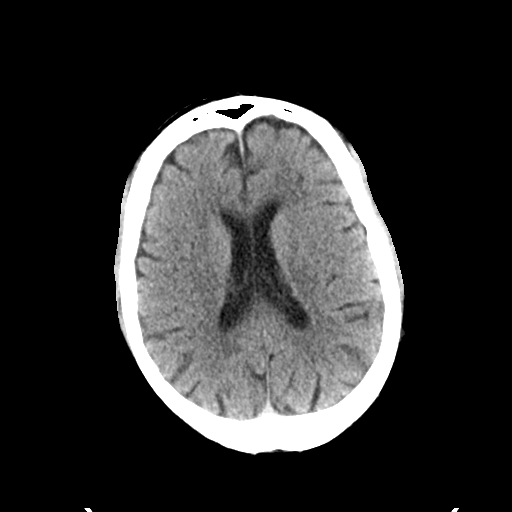
[im 20/31  brain]
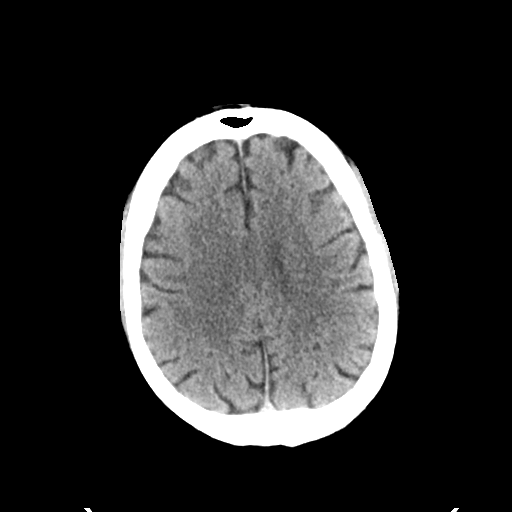
[im 22/31  brain]
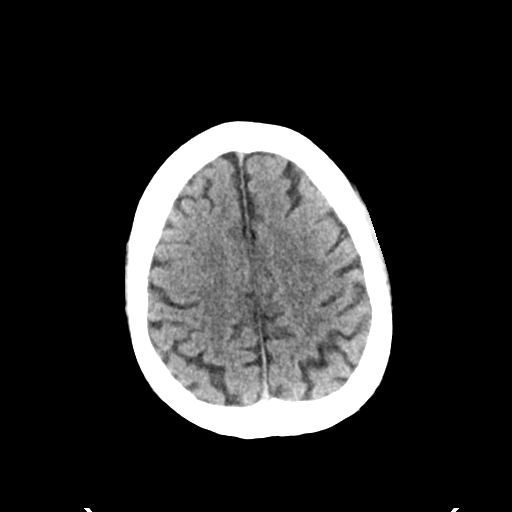
[im 23/31  brain]
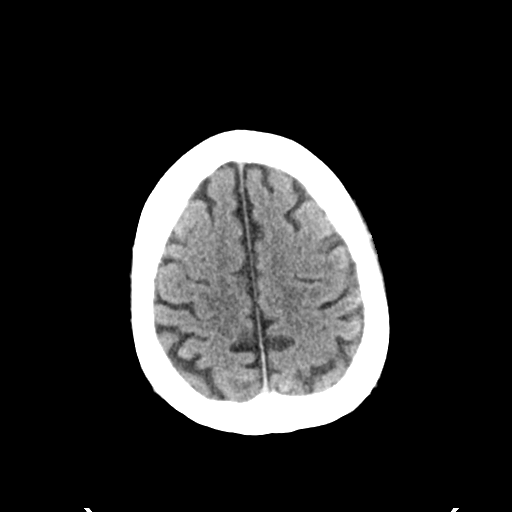
[im 23/31  bone]
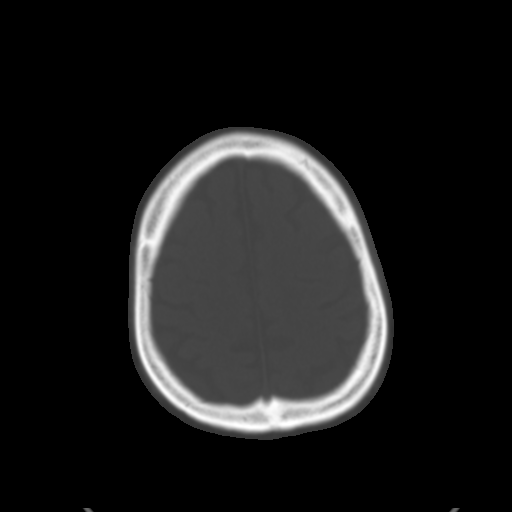
[im 25/31  brain]
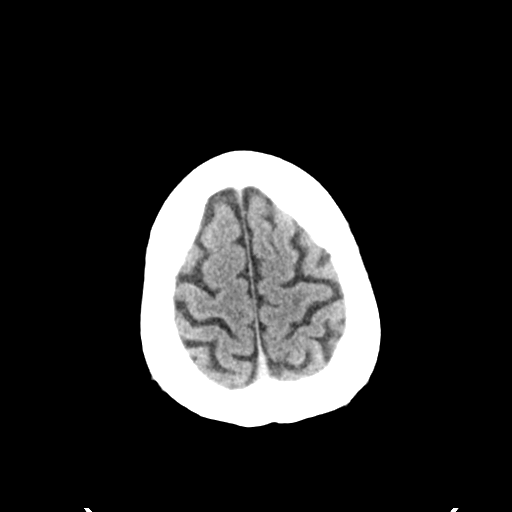
[im 27/31  brain]
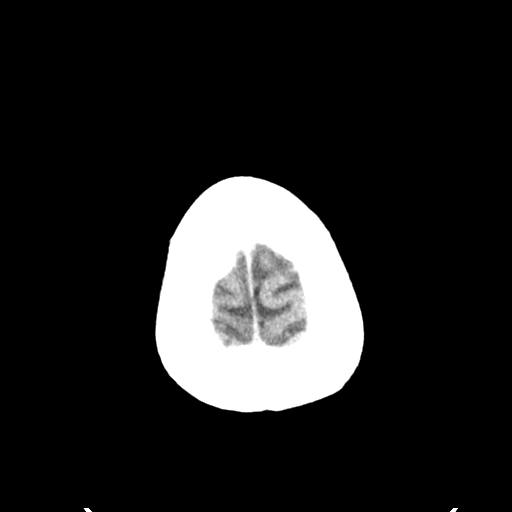
[im 29/31  brain]
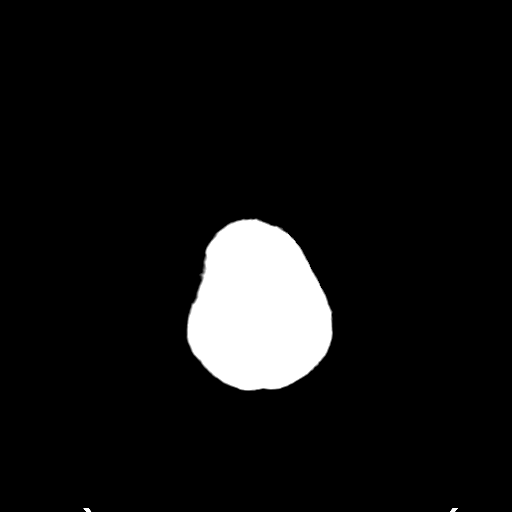

[16 of 30 positions shown; findings below may reference images not displayed]

FINDINGS: Skull:No acute osseous abnormality. No lytic or blastic lesion.

Orbits: No acute abnormality.

Brain: No evidence of acute abnormality, such as acute infarction,
hemorrhage, hydrocephalus, or mass lesion/mass effect. There is been
a remote cortical infarct involving the left occipital pole.
Question mild, patchy bilateral cerebral white matter
low-attenuation, compatible with chronic small vessel ischemia.
IMPRESSION: 1. No evidence of acute intracranial disease.
2. Remote, small left occipital cortical infarct.

## 2014-03-03 IMAGING — CR DG CHEST 1V PORT
1 series · 1 of 1 positions shown · non-contrast
Comparison: 02/18/2012.

CLINICAL DATA: Chest pain. Shortness of breath.

EXAM:
PORTABLE CHEST - 1 VIEW

[AP]
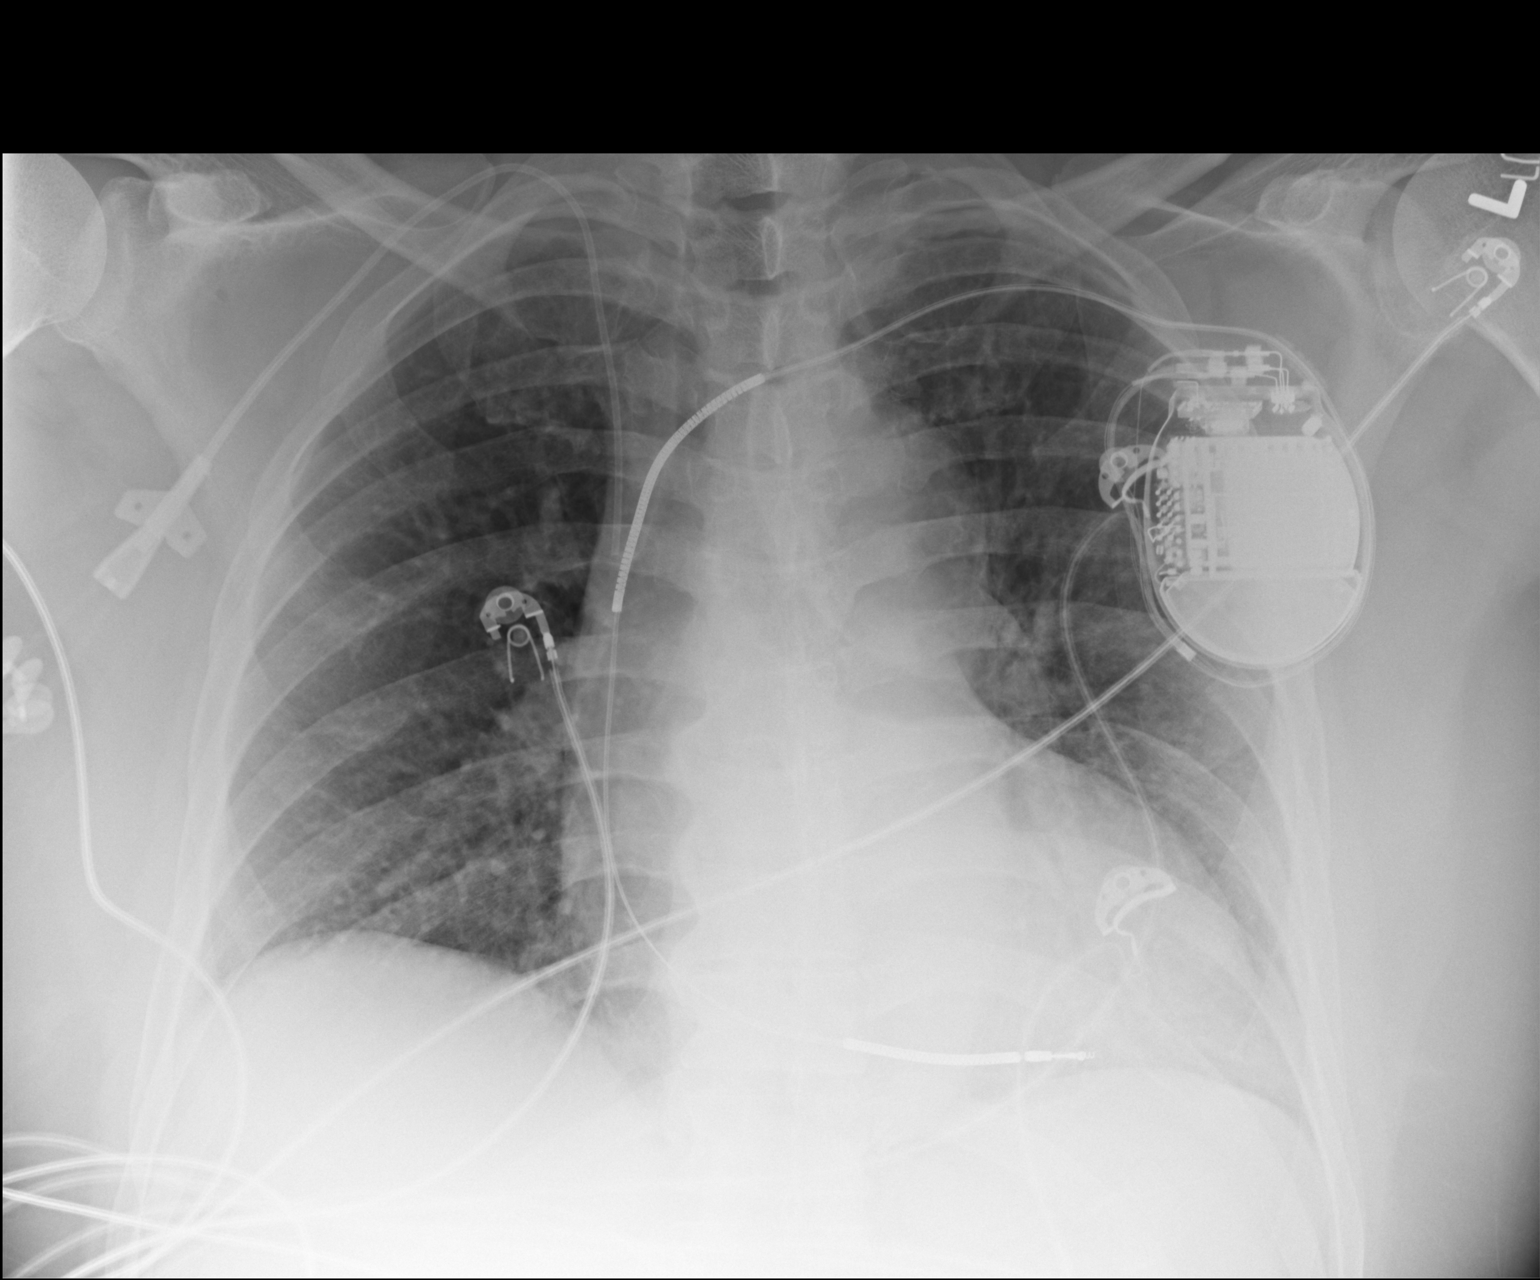

[1 of 1 positions shown; findings below may reference images not displayed]

FINDINGS: Single chamber left subclavian approach AICD/pacer in remarkable
position. Right IJ central venous catheter, tip at the mid SVC.

Chronic cardiopericardial enlargement. Tortuous thoracic aorta,
similar in appearance to radiograph 01/25/2012. No overt edema. No
infiltrate, effusion, or pneumothorax. No acute osseous findings.
Prior lower cervical ACDF with ventral plate and screw fixation.
IMPRESSION: Cardiomegaly without failure.

## 2014-03-04 ENCOUNTER — Ambulatory Visit: Payer: Medicare Other | Admitting: Family

## 2014-03-05 DIAGNOSIS — R0602 Shortness of breath: Secondary | ICD-10-CM | POA: Diagnosis not present

## 2014-03-05 DIAGNOSIS — I509 Heart failure, unspecified: Secondary | ICD-10-CM | POA: Diagnosis not present

## 2014-03-06 ENCOUNTER — Other Ambulatory Visit (HOSPITAL_COMMUNITY): Payer: Self-pay

## 2014-03-06 DIAGNOSIS — I5020 Unspecified systolic (congestive) heart failure: Secondary | ICD-10-CM

## 2014-03-09 ENCOUNTER — Encounter: Payer: Self-pay | Admitting: Family

## 2014-03-09 ENCOUNTER — Ambulatory Visit (INDEPENDENT_AMBULATORY_CARE_PROVIDER_SITE_OTHER): Payer: Medicare Other | Admitting: Family

## 2014-03-09 VITALS — BP 120/82 | HR 54 | Temp 97.4°F | Resp 16 | Ht 75.0 in | Wt 249.0 lb

## 2014-03-09 DIAGNOSIS — F418 Other specified anxiety disorders: Secondary | ICD-10-CM | POA: Diagnosis not present

## 2014-03-09 DIAGNOSIS — F419 Anxiety disorder, unspecified: Secondary | ICD-10-CM | POA: Diagnosis not present

## 2014-03-09 MED ORDER — ALPRAZOLAM 0.5 MG PO TABS: 0.5000 mg | ORAL_TABLET | Freq: Three times a day (TID) | ORAL | Status: DC | PRN

## 2014-03-09 MED ORDER — SERTRALINE HCL 50 MG PO TABS: 50.0000 mg | ORAL_TABLET | Freq: Every day | ORAL | Status: DC

## 2014-03-09 NOTE — Progress Notes (Signed)
Pre visit review using our clinic review tool, if applicable. No additional management support is needed unless otherwise documented below in the visit note. 

## 2014-03-09 NOTE — Patient Instructions (Signed)
Take Zoloft every day. You may use xanax as needed for panic attacks/anxiety. Please schedule a follow up appointment in 1 month.

## 2014-03-09 NOTE — Progress Notes (Signed)
Subjective:    Patient ID: [name], male    DOB: 09/08/1950, 64 y.o.   MRN: [id number]  HPI  [name] is a 64 yr old male who presents today for Depression/anxiety follow up. Last visit lexapro was changed to zoloft.  The patient has not been taking zoloft every day, he got confused and has been taking xanax every day instead.  He reports that anxiety and depression is unchanged.  Does have a lot of stress at home and when he leaves his family situation and visits his girlfriend a few hours a way, he reports this helps his mood.    Review of Systems    see HPI  Past Medical History  Diagnosis Date  . CHF (congestive heart failure)   . Sarcoidosis   . Cardiomyopathy, dilated, nonischemic     non ischemic by cath  . Acute on chronic systolic heart failure   . Automatic implantable cardiac defibrillator in situ   . Atrial fibrillation   . NSVT (nonsustained ventricular tachycardia)   . GERD (gastroesophageal reflux disease)   . Hypercholesteremia   . Shortness of breath   . Chronic kidney disease (CKD), stage III (moderate)   . Pacemaker   . Anginal pain   . Gout   . Hypertension     dr [name]  . Coronary artery disease   . Elevated PSA 06/24/2013  . Myocardial infarction   . Headache   . Neuropathy     History   Social History  . Marital Status: Single    Spouse Name: N/A  . Number of Children: N/A  . Years of Education: N/A   Occupational History  . Not on file.   Social History Main Topics  . Smoking status: Never Smoker   . Smokeless tobacco: Never Used  . Alcohol Use: No  . Drug Use: Yes    Special: "Crack" cocaine, Cocaine     Comment: last use august 2004  . Sexual Activity: Not Currently   Other Topics Concern  . Not on file   Social History Narrative   3 sons- one in [place], 1 in [place], on in [place]   Retired from post office- on disability   Completed Bachelors from [employer]    Past Surgical History  Procedure Laterality Date  .  Pacemaker insertion  2009    with ICD  . Tee without cardioversion  01/17/2011    Procedure: TRANSESOPHAGEAL ECHOCARDIOGRAM (TEE);  Surgeon: Birdie Riddle, MD;  Location: [place];  Service: Cardiovascular;  Laterality: N/A;  . Cardioversion  01/17/2011    Procedure: CARDIOVERSION;  Surgeon: Birdie Riddle, MD;  Location: [place];  Service: Cardiovascular;  Laterality: N/A;  . Cardiac catheterization    . Insert / replace / remove pacemaker    . Anterior cervical decomp/discectomy fusion  08/21/2011    Procedure: ANTERIOR CERVICAL DECOMPRESSION/DISCECTOMY FUSION 2 LEVELS;  Surgeon: Marybelle Killings, MD;  Location: [place];  Service: Orthopedics;  Laterality: N/A;  C5-6, C6-7 Anterior Cervical Discectomy and Fusion, allograft, plate  . Tee without cardioversion N/A 02/16/2012    Procedure: TRANSESOPHAGEAL ECHOCARDIOGRAM (TEE);  Surgeon: Jolaine Artist, MD;  Location: [facility] ENDOSCOPY;  Service: Cardiovascular;  Laterality: N/A;  original case scheduled under his dad (who is deceased), rescheduled under correct mrn/pt/dob. [place]/dl  . Tee without cardioversion N/A 03/22/2012    Procedure: TRANSESOPHAGEAL ECHOCARDIOGRAM (TEE);  Surgeon: Lelon Perla, MD;  Location: [place];  Service: Cardiovascular;  Laterality:  N/A;  . Cardioversion N/A 03/22/2012    Procedure: CARDIOVERSION;  Surgeon: Lelon Perla, MD;  Location: [facility] ENDOSCOPY;  Service: Cardiovascular;  Laterality: N/A;  . Cardioversion N/A 04/26/2012    Procedure: CARDIOVERSION;  Surgeon: Jolaine Artist, MD;  Location: [facility] ENDOSCOPY;  Service: Cardiovascular;  Laterality: N/A;  . Central venous catheter tunneled insertion single lumen  09/16/2012    right IJ  . Cardioversion N/A 11/08/2012    Procedure: CARDIOVERSION;  Surgeon: Jolaine Artist, MD;  Location: [facility] ENDOSCOPY;  Service: Cardiovascular;  Laterality: N/A;  . Back surgery  1987, lower    Ruptured disk repair  . Peripherally inserted central catheter insertion  2013  .  Right heart catheterization N/A 01/26/2012    Procedure: RIGHT HEART CATH;  Surgeon: Jolaine Artist, MD;  Location: [facility] CATH LAB;  Service: Cardiovascular;  Laterality: N/A;  . Right heart catheterization N/A 04/30/2013    Procedure: RIGHT HEART CATH;  Surgeon: Jolaine Artist, MD;  Location: [facility] CATH LAB;  Service: Cardiovascular;  Laterality: N/A;    Family History  Problem Relation Age of Onset  . Heart disease    . Heart failure    . Stroke    . Anesthesia problems Neg Hx   . Hypotension Neg Hx   . Malignant hyperthermia Neg Hx   . Pseudochol deficiency Neg Hx     Allergies  Allergen Reactions  . Nitroglycerin Other (See Comments)    "feels like head is going to bust open"  . Colchicine Nausea Only    Current Outpatient Prescriptions on File Prior to Visit  Medication Sig Dispense Refill  . allopurinol (ZYLOPRIM) 100 MG tablet Take 1 tablet (100 mg total) by mouth daily. 30 tablet 6  . amiodarone (PACERONE) 200 MG tablet Take 1 tablet (200 mg total) by mouth 2 (two) times daily. 60 tablet 6  . apixaban (ELIQUIS) 5 MG TABS tablet Take 1 tablet (5 mg total) by mouth 2 (two) times daily. 60 tablet 4  . carvedilol (COREG) 3.125 MG tablet Take 1 tablet (3.125 mg total) by mouth 2 (two) times daily with a meal. 60 tablet 6  . diphenhydrAMINE (BENADRYL) 50 MG capsule Take 50 mg by mouth daily as needed for itching.    Marland Kitchen eplerenone (INSPRA) 25 MG tablet Take 12.5 mg by mouth every other day.     . esomeprazole (NEXIUM) 40 MG capsule Take 40 mg by mouth 2 (two) times daily as needed (reflux).     . fluticasone (FLONASE) 50 MCG/ACT nasal spray Place 2 sprays into both nostrils daily as needed for allergies. 16 g 2  . Gabapentin Enacarbil ER (HORIZANT) 300 MG TBCR Take 300 mg by mouth at bedtime as needed (pain).     . hydrOXYzine (ATARAX/VISTARIL) 25 MG tablet Take 1 tablet (25 mg total) by mouth 2 (two) times daily as needed. 60 tablet 2  . Magnesium Oxide 400 (240 MG) MG TABS Take  1 tablet by mouth 2 (two) times daily. (Patient taking differently: Take 400 mg by mouth daily. ) 60 tablet 3  . metolazone (ZAROXOLYN) 2.5 MG tablet Take 1 tablet (2.5 mg total) by mouth daily as needed (for fluid retention). 10 tablet 6  . milrinone (PRIMACOR) 20 MG/100ML SOLN infusion Inject 0.375 mcg/kg/min into the vein continuous. 440mg  in 422ml infusion 2.5 ml per hour    . Oxycodone HCl 20 MG TABS Take 1 tablet (20 mg total) by mouth 3 (three) times daily as needed (pain.). (Patient  taking differently: Take 20 mg by mouth 3 (three) times daily as needed (pain.). ) 15 tablet 0  . potassium chloride SA (K-DUR,KLOR-CON) 20 MEQ tablet Take 2 tablets (40 mEq total) by mouth daily. 60 tablet 6  . promethazine (PHENERGAN) 25 MG tablet Take 25 mg by mouth every 6 (six) hours as needed for nausea or vomiting.    . ranitidine (ZANTAC) 150 MG capsule Take 150 mg by mouth daily as needed for heartburn (reflux).    Marland Kitchen senna-docusate (SENOKOT-S) 8.6-50 MG per tablet Take 1-2 tablets by mouth 2 (two) times daily as needed for mild constipation or moderate constipation. 60 tablet 3  . sucralfate (CARAFATE) 1 GM/10ML suspension Take 10 mLs (1 g total) by mouth 4 (four) times daily. (Patient taking differently: Take 1 g by mouth 2 (two) times daily. ) 420 mL 1  . torsemide (DEMADEX) 20 MG tablet Take 2 tablets (40 mg total) by mouth daily. 60 tablet 6  . zolpidem (AMBIEN) 5 MG tablet Take 1.5 tablets (7.5 mg total) by mouth at bedtime. 45 tablet 0   No current facility-administered medications on file prior to visit.    BP 120/82 mmHg  Pulse 54  Temp(Src) 97.4 F (36.3 C) (Oral)  Resp 16  Ht 6\' 3"  (1.905 m)  Wt 249 lb (112.946 kg)  BMI 31.12 kg/m2  SpO2 98%    Objective:   Physical Exam  Constitutional: He is oriented to person, place, and time. He appears well-developed and well-nourished. No distress.  HENT:  Head: Normocephalic and atraumatic.  Pulmonary/Chest: Effort normal and breath sounds  normal.  Neurological: He is alert and oriented to person, place, and time.  Skin: Skin is warm and dry.  Psychiatric: He has a normal mood and affect. His behavior is normal. Thought content normal.          Assessment & Plan:

## 2014-03-12 DIAGNOSIS — I509 Heart failure, unspecified: Secondary | ICD-10-CM | POA: Diagnosis not present

## 2014-03-12 DIAGNOSIS — I5023 Acute on chronic systolic (congestive) heart failure: Secondary | ICD-10-CM | POA: Diagnosis not present

## 2014-03-12 DIAGNOSIS — R0602 Shortness of breath: Secondary | ICD-10-CM | POA: Diagnosis not present

## 2014-03-12 NOTE — Assessment & Plan Note (Signed)
We clarified that zoloft should be a daily med and xanax prn.  Pt is now clear on this and will begin daily zoloft. Follow up in 1 month.

## 2014-03-19 DIAGNOSIS — I509 Heart failure, unspecified: Secondary | ICD-10-CM | POA: Diagnosis not present

## 2014-03-19 DIAGNOSIS — R0602 Shortness of breath: Secondary | ICD-10-CM | POA: Diagnosis not present

## 2014-03-19 DIAGNOSIS — I5023 Acute on chronic systolic (congestive) heart failure: Secondary | ICD-10-CM | POA: Diagnosis not present

## 2014-03-20 ENCOUNTER — Telehealth (HOSPITAL_COMMUNITY): Payer: Self-pay | Admitting: *Deleted

## 2014-03-20 NOTE — Telephone Encounter (Signed)
Application for handicap parking sticker completed, signed by Dr Haroldine Laws and mailed to pt

## 2014-03-25 ENCOUNTER — Encounter: Payer: Self-pay | Admitting: Physician Assistant

## 2014-03-25 ENCOUNTER — Ambulatory Visit (INDEPENDENT_AMBULATORY_CARE_PROVIDER_SITE_OTHER): Payer: Medicare Other | Admitting: Physician Assistant

## 2014-03-25 VITALS — BP 100/85 | HR 95 | Temp 98.5°F | Resp 20 | Ht 75.0 in | Wt 242.0 lb

## 2014-03-25 DIAGNOSIS — B360 Pityriasis versicolor: Secondary | ICD-10-CM | POA: Diagnosis not present

## 2014-03-25 DIAGNOSIS — R5383 Other fatigue: Secondary | ICD-10-CM | POA: Insufficient documentation

## 2014-03-25 DIAGNOSIS — E875 Hyperkalemia: Secondary | ICD-10-CM

## 2014-03-25 MED ORDER — ZOLPIDEM TARTRATE 5 MG PO TABS: 7.5000 mg | ORAL_TABLET | Freq: Every day | ORAL | Status: DC

## 2014-03-25 MED ORDER — KETOCONAZOLE 2 % EX CREA: 1.0000 "application " | TOPICAL_CREAM | Freq: Two times a day (BID) | CUTANEOUS | Status: DC

## 2014-03-25 NOTE — Assessment & Plan Note (Signed)
Unclear etiology. Likely multifactorial. Will obtain lab workup today. Some mild positional lightheadedness noted. BP low on standing, but not truly orthostasis. Patient has prescription compression stockings at home. Encouraged patient to wear them every day. Follow-up with PCP as scheduled.

## 2014-03-25 NOTE — Patient Instructions (Signed)
For the rash, please use the ketoconazole cream twice daily as directed for 2 weeks. Make sure to dry the skin well after taking a shower or bath. Follow-up if symptoms are not improving.  For your fatigue, please stop by the lab for blood work.  Make sure to take your medications as directed. Stay well hydrated and eat a well-balanced diet. Wear your compression stockings every day, as this will prevent your blood pressure from dropping when you are standing.  Follow-up if symptoms not improving.

## 2014-03-25 NOTE — Assessment & Plan Note (Signed)
Rx ketoconazole ointment twice daily for 2 weeks. Supportive measures discussed with patient.

## 2014-03-25 NOTE — Progress Notes (Signed)
Patient presents to clinic today c/o intermittent rash of chest, with both dark and light lesions. States rash is sometimes itchy. Sometimes "daily". Symptoms been present off and on for the past 3-6 months. Patient has not taken anything for his symptoms.  Patient also complains of fatigue over the past 2-3 weeks. Denies change in medication regimen. Denies change in diet. Has noticed some lightheadedness with standing. Denies dizziness. Denies chest pain or shortness of breath. Is followed by cardiology for atrial fibrillation and congestive heart failure, which is well controlled at present.  Past Medical History  Diagnosis Date  . CHF (congestive heart failure)   . Sarcoidosis   . Cardiomyopathy, dilated, nonischemic     non ischemic by cath  . Acute on chronic systolic heart failure   . Automatic implantable cardiac defibrillator in situ   . Atrial fibrillation   . NSVT (nonsustained ventricular tachycardia)   . GERD (gastroesophageal reflux disease)   . Hypercholesteremia   . Shortness of breath   . Chronic kidney disease (CKD), stage III (moderate)   . Pacemaker   . Anginal pain   . Gout   . Hypertension     dr [name]  . Coronary artery disease   . Elevated PSA 06/24/2013  . Myocardial infarction   . Headache   . Neuropathy     Current Outpatient Prescriptions on File Prior to Visit  Medication Sig Dispense Refill  . allopurinol (ZYLOPRIM) 100 MG tablet Take 1 tablet (100 mg total) by mouth daily. 30 tablet 6  . ALPRAZolam (XANAX) 0.5 MG tablet Take 1 tablet (0.5 mg total) by mouth 3 (three) times daily as needed for anxiety. 60 tablet 0  . amiodarone (PACERONE) 200 MG tablet Take 1 tablet (200 mg total) by mouth 2 (two) times daily. 60 tablet 6  . apixaban (ELIQUIS) 5 MG TABS tablet Take 1 tablet (5 mg total) by mouth 2 (two) times daily. 60 tablet 4  . diphenhydrAMINE (BENADRYL) 50 MG capsule Take 50 mg by mouth daily as needed for itching.    Marland Kitchen eplerenone (INSPRA)  25 MG tablet Take 12.5 mg by mouth every other day.     . esomeprazole (NEXIUM) 40 MG capsule Take 40 mg by mouth 2 (two) times daily as needed (reflux).     . fluticasone (FLONASE) 50 MCG/ACT nasal spray Place 2 sprays into both nostrils daily as needed for allergies. 16 g 2  . Gabapentin Enacarbil ER (HORIZANT) 300 MG TBCR Take 300 mg by mouth at bedtime as needed (pain).     . hydrOXYzine (ATARAX/VISTARIL) 25 MG tablet Take 1 tablet (25 mg total) by mouth 2 (two) times daily as needed. 60 tablet 2  . Magnesium Oxide 400 (240 MG) MG TABS Take 1 tablet by mouth 2 (two) times daily. (Patient taking differently: Take 400 mg by mouth daily. ) 60 tablet 3  . metolazone (ZAROXOLYN) 2.5 MG tablet Take 1 tablet (2.5 mg total) by mouth daily as needed (for fluid retention). 10 tablet 6  . milrinone (PRIMACOR) 20 MG/100ML SOLN infusion Inject 0.375 mcg/kg/min into the vein continuous. $RemoveBeforeDE'440mg'GikzLGHGgWprddC$  in 477ml infusion 2.5 ml per hour    . Oxycodone HCl 20 MG TABS Take 1 tablet (20 mg total) by mouth 3 (three) times daily as needed (pain.). (Patient taking differently: Take 20 mg by mouth 3 (three) times daily as needed (pain.). ) 15 tablet 0  . potassium chloride SA (K-DUR,KLOR-CON) 20 MEQ tablet Take 2 tablets (40 mEq total) by  mouth daily. 60 tablet 6  . promethazine (PHENERGAN) 25 MG tablet Take 25 mg by mouth every 6 (six) hours as needed for nausea or vomiting.    . ranitidine (ZANTAC) 150 MG capsule Take 150 mg by mouth daily as needed for heartburn (reflux).    Marland Kitchen senna-docusate (SENOKOT-S) 8.6-50 MG per tablet Take 1-2 tablets by mouth 2 (two) times daily as needed for mild constipation or moderate constipation. 60 tablet 3  . sertraline (ZOLOFT) 50 MG tablet Take 1 tablet (50 mg total) by mouth daily. 30 tablet 3  . sucralfate (CARAFATE) 1 GM/10ML suspension Take 10 mLs (1 g total) by mouth 4 (four) times daily. (Patient taking differently: Take 1 g by mouth 2 (two) times daily. ) 420 mL 1  . torsemide  (DEMADEX) 20 MG tablet Take 2 tablets (40 mg total) by mouth daily. (Patient taking differently: Take 40 mg by mouth daily. Take [3] tabs in Am and [2] tabs in Evening) 60 tablet 6   No current facility-administered medications on file prior to visit.    Allergies  Allergen Reactions  . Nitroglycerin Other (See Comments)    "feels like head is going to bust open"  . Colchicine Nausea Only    Family History  Problem Relation Age of Onset  . Heart disease    . Heart failure    . Stroke    . Anesthesia problems Neg Hx   . Hypotension Neg Hx   . Malignant hyperthermia Neg Hx   . Pseudochol deficiency Neg Hx     History   Social History  . Marital Status: Single    Spouse Name: N/A  . Number of Children: N/A  . Years of Education: N/A   Social History Main Topics  . Smoking status: Never Smoker   . Smokeless tobacco: Never Used  . Alcohol Use: No  . Drug Use: Yes    Special: "Crack" cocaine, Cocaine     Comment: last use august 2004  . Sexual Activity: Not Currently   Other Topics Concern  . None   Social History Narrative   3 sons- one in [place], 1 in [place], on in [place]   Retired from post office- on disability   Completed Bachelors from [employer]    Review of Systems - See HPI.  All other ROS are negative.  BP 100/85 mmHg  Pulse 95  Temp(Src) 98.5 F (36.9 C) (Oral)  Resp 20  Ht $R'6\' 3"'by$  (1.905 m)  Wt 242 lb (109.77 kg)  BMI 30.25 kg/m2  SpO2 97%  Physical Exam  Constitutional: He is oriented to person, place, and time and well-developed, well-nourished, and in no distress.  HENT:  Head: Normocephalic and atraumatic.  Cardiovascular: Normal rate, regular rhythm, normal heart sounds and intact distal pulses.   Pulmonary/Chest: Effort normal and breath sounds normal. No respiratory distress. He has no wheezes. He has no rales. He exhibits no tenderness.  Neurological: He is alert and oriented to person, place, and time.  Skin: Skin is warm and dry.  Presence of a  multi-pigmented fungal rash on torso, with shining and scaling.  Psychiatric: Affect normal.  Vitals reviewed.   Recent Results (from the past 2160 hour(s))  Comprehensive metabolic panel     Status: Abnormal   Collection Time: 01/09/14 11:44 AM  Result Value Ref Range   Sodium 137 135 - 145 mmol/L    Comment: Please note change in reference range.   Potassium 3.1 (L) 3.5 -  5.1 mmol/L    Comment: Please note change in reference range.   Chloride 103 96 - 112 mEq/L   CO2 28 19 - 32 mmol/L   Glucose, Bld 131 (H) 70 - 99 mg/dL   BUN 38 (H) 6 - 23 mg/dL   Creatinine, Ser 2.41 (H) 0.50 - 1.35 mg/dL   Calcium 9.3 8.4 - 10.5 mg/dL   Total Protein 7.0 6.0 - 8.3 g/dL   Albumin 3.2 (L) 3.5 - 5.2 g/dL   AST 35 0 - 37 U/L   ALT 23 0 - 53 U/L   Alkaline Phosphatase 72 39 - 117 U/L   Total Bilirubin 1.2 0.3 - 1.2 mg/dL   GFR calc non Af Amer 27 (L) >90 mL/min   GFR calc Af Amer 31 (L) >90 mL/min    Comment: (NOTE) The eGFR has been calculated using the CKD EPI equation. This calculation has not been validated in all clinical situations. eGFR's persistently <90 mL/min signify possible Chronic Kidney Disease.    Anion gap 6 5 - 15  Magnesium     Status: None   Collection Time: 01/09/14 11:44 AM  Result Value Ref Range   Magnesium 2.2 1.5 - 2.5 mg/dL  Brain natriuretic peptide     Status: Abnormal   Collection Time: 01/09/14 11:44 AM  Result Value Ref Range   B Natriuretic Peptide 178.0 (H) 0.0 - 100.0 pg/mL    Comment: Please note change in reference range.  CBC WITH DIFFERENTIAL     Status: Abnormal   Collection Time: 01/09/14 11:44 AM  Result Value Ref Range   WBC 8.8 4.0 - 10.5 K/uL   RBC 4.24 4.22 - 5.81 MIL/uL   Hemoglobin 12.1 (L) 13.0 - 17.0 g/dL   HCT 36.2 (L) 39.0 - 52.0 %   MCV 85.4 78.0 - 100.0 fL   MCH 28.5 26.0 - 34.0 pg   MCHC 33.4 30.0 - 36.0 g/dL   RDW 13.3 11.5 - 15.5 %   Platelets 146 (L) 150 - 400 K/uL   Neutrophils Relative % 69 43 - 77 %   Neutro Abs 6.1  1.7 - 7.7 K/uL   Lymphocytes Relative 16 12 - 46 %   Lymphs Abs 1.4 0.7 - 4.0 K/uL   Monocytes Relative 14 (H) 3 - 12 %   Monocytes Absolute 1.3 (H) 0.1 - 1.0 K/uL   Eosinophils Relative 1 0 - 5 %   Eosinophils Absolute 0.1 0.0 - 0.7 K/uL   Basophils Relative 0 0 - 1 %   Basophils Absolute 0.0 0.0 - 0.1 K/uL  Culture, blood (routine x 2)     Status: None   Collection Time: 01/09/14  8:00 PM  Result Value Ref Range   Specimen Description BLOOD RIGHT ARM    Special Requests      BOTTLES DRAWN AEROBIC AND ANAEROBIC 10CC AER,5CC ANA   Culture      NO GROWTH 5 DAYS Performed at [facility]    Report Status 01/16/2014 FINAL   Culture, blood (routine x 2)     Status: None   Collection Time: 01/09/14  8:15 PM  Result Value Ref Range   Specimen Description BLOOD LEFT ARM    Special Requests      BOTTLES DRAWN AEROBIC AND ANAEROBIC 10CC AER,5CC ANA   Culture      NO GROWTH 5 DAYS Performed at [facility]    Report Status 01/16/2014 FINAL   CBC     Status:  Abnormal   Collection Time: 01/09/14  8:15 PM  Result Value Ref Range   WBC 7.8 4.0 - 10.5 K/uL   RBC 4.32 4.22 - 5.81 MIL/uL   Hemoglobin 12.4 (L) 13.0 - 17.0 g/dL   HCT 36.8 (L) 39.0 - 52.0 %   MCV 85.2 78.0 - 100.0 fL   MCH 28.7 26.0 - 34.0 pg   MCHC 33.7 30.0 - 36.0 g/dL   RDW 13.2 11.5 - 15.5 %   Platelets 141 (L) 150 - 400 K/uL  Culture, blood (single)     Status: None   Collection Time: 01/09/14  9:15 PM  Result Value Ref Range   Specimen Description BLOOD PICC LINE    Special Requests BOTTLES DRAWN AEROBIC AND ANAEROBIC 5CC    Culture      ENTEROBACTER CLOACAE Note: Gram Stain Report Called to,Read Back By and Verified With: PEGGY GERHARD 01/10/14 @ 7:15PM BY RUSCOE A. Performed at [facility]    Report Status 01/12/2014 FINAL    Organism ID, Bacteria ENTEROBACTER CLOACAE       Susceptibility   Enterobacter cloacae - MIC*    CEFAZOLIN >=64 RESISTANT Resistant     CEFEPIME <=1 SENSITIVE  Sensitive     CEFTAZIDIME <=1 SENSITIVE Sensitive     CEFTRIAXONE <=1 SENSITIVE Sensitive     CIPROFLOXACIN <=0.25 SENSITIVE Sensitive     GENTAMICIN <=1 SENSITIVE Sensitive     IMIPENEM <=0.25 SENSITIVE Sensitive     PIP/TAZO 8 SENSITIVE Sensitive     TOBRAMYCIN <=1 SENSITIVE Sensitive     TRIMETH/SULFA <=20 SENSITIVE Sensitive     * ENTEROBACTER CLOACAE  Urinalysis, Routine w reflex microscopic     Status: Abnormal   Collection Time: 01/10/14  2:15 AM  Result Value Ref Range   Color, Urine AMBER (A) YELLOW    Comment: BIOCHEMICALS MAY BE AFFECTED BY COLOR   APPearance CLEAR CLEAR   Specific Gravity, Urine 1.021 1.005 - 1.030   pH 6.0 5.0 - 8.0   Glucose, UA NEGATIVE NEGATIVE mg/dL   Hgb urine dipstick NEGATIVE NEGATIVE   Bilirubin Urine SMALL (A) NEGATIVE   Ketones, ur 15 (A) NEGATIVE mg/dL   Protein, ur 100 (A) NEGATIVE mg/dL   Urobilinogen, UA 2.0 (H) 0.0 - 1.0 mg/dL   Nitrite NEGATIVE NEGATIVE   Leukocytes, UA NEGATIVE NEGATIVE  Urine microscopic-add on     Status: None   Collection Time: 01/10/14  2:15 AM  Result Value Ref Range   Squamous Epithelial / LPF RARE RARE   WBC, UA 0-2 <3 WBC/hpf   RBC / HPF 0-2 <3 RBC/hpf   Bacteria, UA RARE RARE  Clostridium Difficile by PCR     Status: None   Collection Time: 01/10/14  2:20 AM  Result Value Ref Range   C difficile by pcr NEGATIVE NEGATIVE  Basic metabolic panel     Status: Abnormal   Collection Time: 01/10/14  5:37 AM  Result Value Ref Range   Sodium 133 (L) 135 - 145 mmol/L    Comment: Please note change in reference range.   Potassium 3.9 3.5 - 5.1 mmol/L    Comment: Please note change in reference range. DELTA CHECK NOTED    Chloride 104 96 - 112 mEq/L   CO2 25 19 - 32 mmol/L   Glucose, Bld 107 (H) 70 - 99 mg/dL   BUN 30 (H) 6 - 23 mg/dL   Creatinine, Ser 2.27 (H) 0.50 - 1.35 mg/dL   Calcium 8.6 8.4 -  10.5 mg/dL   GFR calc non Af Amer 29 (L) >90 mL/min   GFR calc Af Amer 34 (L) >90 mL/min    Comment:  (NOTE) The eGFR has been calculated using the CKD EPI equation. This calculation has not been validated in all clinical situations. eGFR's persistently <90 mL/min signify possible Chronic Kidney Disease.    Anion gap 4 (L) 5 - 15  Basic metabolic panel     Status: Abnormal   Collection Time: 01/11/14  5:08 AM  Result Value Ref Range   Sodium 136 135 - 145 mmol/L    Comment: Please note change in reference range.   Potassium 3.8 3.5 - 5.1 mmol/L    Comment: Please note change in reference range.   Chloride 104 96 - 112 mEq/L   CO2 26 19 - 32 mmol/L   Glucose, Bld 108 (H) 70 - 99 mg/dL   BUN 19 6 - 23 mg/dL    Comment: DELTA CHECK NOTED   Creatinine, Ser 1.84 (H) 0.50 - 1.35 mg/dL   Calcium 9.1 8.4 - 10.5 mg/dL   GFR calc non Af Amer 37 (L) >90 mL/min   GFR calc Af Amer 43 (L) >90 mL/min    Comment: (NOTE) The eGFR has been calculated using the CKD EPI equation. This calculation has not been validated in all clinical situations. eGFR's persistently <90 mL/min signify possible Chronic Kidney Disease.    Anion gap 6 5 - 15  Magnesium     Status: None   Collection Time: 01/11/14  5:08 AM  Result Value Ref Range   Magnesium 2.1 1.5 - 2.5 mg/dL  Basic metabolic panel     Status: Abnormal   Collection Time: 01/12/14  3:07 AM  Result Value Ref Range   Sodium 135 135 - 145 mmol/L    Comment: Please note change in reference range.   Potassium 4.1 3.5 - 5.1 mmol/L    Comment: Please note change in reference range.   Chloride 110 96 - 112 mEq/L   CO2 22 19 - 32 mmol/L   Glucose, Bld 107 (H) 70 - 99 mg/dL   BUN 20 6 - 23 mg/dL   Creatinine, Ser 1.91 (H) 0.50 - 1.35 mg/dL   Calcium 9.1 8.4 - 10.5 mg/dL   GFR calc non Af Amer 35 (L) >90 mL/min   GFR calc Af Amer 41 (L) >90 mL/min    Comment: (NOTE) The eGFR has been calculated using the CKD EPI equation. This calculation has not been validated in all clinical situations. eGFR's persistently <90 mL/min signify possible Chronic  Kidney Disease.    Anion gap 3 (L) 5 - 15  Culture, blood (routine x 2)     Status: None   Collection Time: 01/12/14 10:33 AM  Result Value Ref Range   Specimen Description BLOOD RIGHT FOREARM    Special Requests BOTTLES DRAWN AEROBIC AND ANAEROBIC 6CC    Culture      NO GROWTH 5 DAYS Performed at [facility]    Report Status 01/18/2014 FINAL   Culture, blood (routine x 2)     Status: None   Collection Time: 01/12/14 10:44 AM  Result Value Ref Range   Specimen Description BLOOD RIGHT WRIST    Special Requests BOTTLES DRAWN AEROBIC AND ANAEROBIC 10CC    Culture      NO GROWTH 5 DAYS Note: Culture results may be compromised due to an excessive volume of blood received in culture bottles. Performed at [facility]    Report Status 01/18/2014 FINAL   Basic metabolic panel     Status: Abnormal   Collection Time: 01/13/14  6:05 AM  Result Value Ref Range   Sodium 133 (L) 135 - 145 mmol/L    Comment: Please note change in reference range.   Potassium 3.6 3.5 - 5.1 mmol/L    Comment: Please note change in reference range.   Chloride 105 96 - 112 mEq/L   CO2 23 19 - 32 mmol/L   Glucose, Bld 122 (H) 70 - 99 mg/dL   BUN 19 6 - 23 mg/dL   Creatinine, Ser 2.12 (H) 0.50 - 1.35 mg/dL   Calcium 9.0 8.4 - 10.5 mg/dL   GFR calc non Af Amer 31 (L) >90 mL/min   GFR calc Af Amer 36 (L) >90 mL/min    Comment: (NOTE) The eGFR has been calculated using the CKD EPI equation. This calculation has not been validated in all clinical situations. eGFR's persistently <90 mL/min signify possible Chronic Kidney Disease.    Anion gap 5 5 - 15  Basic metabolic panel     Status: Abnormal   Collection Time: 01/14/14  4:44 AM  Result Value Ref Range   Sodium 134 (L) 135 - 145 mmol/L    Comment: Please note change in reference range.   Potassium 4.1 3.5 - 5.1 mmol/L    Comment: Please note change in reference range.   Chloride 102 96 - 112 mEq/L   CO2 24 19 - 32 mmol/L   Glucose,  Bld 94 70 - 99 mg/dL   BUN 26 (H) 6 - 23 mg/dL   Creatinine, Ser 2.45 (H) 0.50 - 1.35 mg/dL   Calcium 9.4 8.4 - 10.5 mg/dL   GFR calc non Af Amer 26 (L) >90 mL/min   GFR calc Af Amer 30 (L) >90 mL/min    Comment: (NOTE) The eGFR has been calculated using the CKD EPI equation. This calculation has not been validated in all clinical situations. eGFR's persistently <90 mL/min signify possible Chronic Kidney Disease.    Anion gap 8 5 - 15  Basic metabolic panel     Status: Abnormal   Collection Time: 01/15/14  9:15 AM  Result Value Ref Range   Sodium 139 135 - 145 mmol/L    Comment: Please note change in reference range.   Potassium 4.2 3.5 - 5.1 mmol/L    Comment: Please note change in reference range.   Chloride 100 96 - 112 mEq/L   CO2 23 19 - 32 mmol/L   Glucose, Bld 128 (H) 70 - 99 mg/dL   BUN 24 (H) 6 - 23 mg/dL   Creatinine, Ser 1.99 (H) 0.50 - 1.35 mg/dL   Calcium 9.9 8.4 - 10.5 mg/dL   GFR calc non Af Amer 34 (L) >90 mL/min   GFR calc Af Amer 39 (L) >90 mL/min    Comment: (NOTE) The eGFR has been calculated using the CKD EPI equation. This calculation has not been validated in all clinical situations. eGFR's persistently <90 mL/min signify possible Chronic Kidney Disease.    Anion gap 16 (H) 5 - 15  Basic metabolic panel     Status: Abnormal   Collection Time: 01/29/14 12:30 PM  Result Value Ref Range   Sodium 139 135 - 145 mEq/L   Potassium 3.7 3.5 - 5.1 mEq/L   Chloride 104 96 - 112 mEq/L   CO2 26 19 - 32 mEq/L   Glucose, Bld 95 70 -  99 mg/dL   BUN 35 (H) 6 - 23 mg/dL   Creatinine, Ser 1.79 (H) 0.40 - 1.50 mg/dL   Calcium 10.0 8.4 - 10.5 mg/dL   GFR 49.41 (L) >60.00 mL/min    Assessment/Plan: Other fatigue Unclear etiology. Likely multifactorial. Will obtain lab workup today. Some mild positional lightheadedness noted. BP low on standing, but not truly orthostasis. Patient has prescription compression stockings at home. Encouraged patient to wear them every  day. Follow-up with PCP as scheduled.   Tinea versicolor Rx ketoconazole ointment twice daily for 2 weeks. Supportive measures discussed with patient.

## 2014-03-25 NOTE — Progress Notes (Signed)
Pre visit review using our clinic review tool, if applicable. No additional management support is needed unless otherwise documented below in the visit note/SLS  

## 2014-03-26 ENCOUNTER — Emergency Department (HOSPITAL_COMMUNITY): Payer: Medicare Other

## 2014-03-26 ENCOUNTER — Telehealth: Payer: Self-pay | Admitting: Physician Assistant

## 2014-03-26 ENCOUNTER — Emergency Department (HOSPITAL_COMMUNITY)
Admission: EM | Admit: 2014-03-26 | Discharge: 2014-03-27 | Disposition: A | Discharge status: Home / Self Care | Payer: Medicare Other | Attending: Emergency Medicine | Admitting: Emergency Medicine

## 2014-03-26 ENCOUNTER — Encounter (HOSPITAL_COMMUNITY): Payer: Self-pay | Admitting: Emergency Medicine

## 2014-03-26 ENCOUNTER — Other Ambulatory Visit (INDEPENDENT_AMBULATORY_CARE_PROVIDER_SITE_OTHER): Payer: Medicare Other

## 2014-03-26 DIAGNOSIS — Z79899 Other long term (current) drug therapy: Secondary | ICD-10-CM | POA: Insufficient documentation

## 2014-03-26 DIAGNOSIS — Z862 Personal history of diseases of the blood and blood-forming organs and certain disorders involving the immune mechanism: Secondary | ICD-10-CM | POA: Diagnosis not present

## 2014-03-26 DIAGNOSIS — R5383 Other fatigue: Secondary | ICD-10-CM | POA: Diagnosis not present

## 2014-03-26 DIAGNOSIS — G629 Polyneuropathy, unspecified: Secondary | ICD-10-CM | POA: Diagnosis not present

## 2014-03-26 DIAGNOSIS — I252 Old myocardial infarction: Secondary | ICD-10-CM | POA: Insufficient documentation

## 2014-03-26 DIAGNOSIS — J069 Acute upper respiratory infection, unspecified: Secondary | ICD-10-CM | POA: Insufficient documentation

## 2014-03-26 DIAGNOSIS — Z8639 Personal history of other endocrine, nutritional and metabolic disease: Secondary | ICD-10-CM | POA: Insufficient documentation

## 2014-03-26 DIAGNOSIS — K219 Gastro-esophageal reflux disease without esophagitis: Secondary | ICD-10-CM | POA: Diagnosis not present

## 2014-03-26 DIAGNOSIS — M109 Gout, unspecified: Secondary | ICD-10-CM | POA: Diagnosis not present

## 2014-03-26 DIAGNOSIS — I5033 Acute on chronic diastolic (congestive) heart failure: Secondary | ICD-10-CM | POA: Diagnosis not present

## 2014-03-26 DIAGNOSIS — B9789 Other viral agents as the cause of diseases classified elsewhere: Secondary | ICD-10-CM

## 2014-03-26 DIAGNOSIS — R42 Dizziness and giddiness: Secondary | ICD-10-CM | POA: Insufficient documentation

## 2014-03-26 DIAGNOSIS — R9431 Abnormal electrocardiogram [ECG] [EKG]: Secondary | ICD-10-CM | POA: Diagnosis not present

## 2014-03-26 DIAGNOSIS — Z95810 Presence of automatic (implantable) cardiac defibrillator: Secondary | ICD-10-CM | POA: Diagnosis not present

## 2014-03-26 DIAGNOSIS — I509 Heart failure, unspecified: Secondary | ICD-10-CM | POA: Diagnosis not present

## 2014-03-26 DIAGNOSIS — E875 Hyperkalemia: Secondary | ICD-10-CM | POA: Diagnosis not present

## 2014-03-26 DIAGNOSIS — R000 Tachycardia, unspecified: Secondary | ICD-10-CM | POA: Diagnosis not present

## 2014-03-26 DIAGNOSIS — I5023 Acute on chronic systolic (congestive) heart failure: Secondary | ICD-10-CM | POA: Diagnosis not present

## 2014-03-26 DIAGNOSIS — N183 Chronic kidney disease, stage 3 (moderate): Secondary | ICD-10-CM | POA: Diagnosis not present

## 2014-03-26 DIAGNOSIS — I129 Hypertensive chronic kidney disease with stage 1 through stage 4 chronic kidney disease, or unspecified chronic kidney disease: Secondary | ICD-10-CM | POA: Insufficient documentation

## 2014-03-26 DIAGNOSIS — R05 Cough: Secondary | ICD-10-CM | POA: Diagnosis not present

## 2014-03-26 DIAGNOSIS — R0602 Shortness of breath: Secondary | ICD-10-CM | POA: Diagnosis not present

## 2014-03-26 LAB — COMPREHENSIVE METABOLIC PANEL
ALK PHOS: 102 U/L (ref 39–117)
ALT: 50 U/L (ref 0–53)
ALT: 43 U/L (ref 0–53)
AST: 67 U/L — ABNORMAL HIGH (ref 0–37)
AST: 52 U/L — ABNORMAL HIGH (ref 0–37)
Albumin: 3.7 g/dL (ref 3.5–5.2)
Albumin: 3.5 g/dL (ref 3.5–5.2)
Alkaline Phosphatase: 108 U/L (ref 39–117)
BILIRUBIN TOTAL: 0.6 mg/dL (ref 0.2–1.2)
BUN: 38 mg/dL — ABNORMAL HIGH (ref 6–23)
BUN: 33 mg/dL — ABNORMAL HIGH (ref 6–23)
CO2: 22 mEq/L (ref 19–32)
CO2: 26 mEq/L (ref 19–32)
CREATININE: 2.07 mg/dL — AB (ref 0.40–1.50)
Calcium: 10 mg/dL (ref 8.4–10.5)
Calcium: 9.9 mg/dL (ref 8.4–10.5)
Chloride: 104 mEq/L (ref 96–112)
Chloride: 105 mEq/L (ref 96–112)
Creatinine, Ser: 1.93 mg/dL — ABNORMAL HIGH (ref 0.40–1.50)
GFR: 41.76 mL/min — AB (ref >60.00)
GFR: 45.27 mL/min — ABNORMAL LOW (ref >60.00)
GLUCOSE: 95 mg/dL (ref 70–99)
Glucose, Bld: 52 mg/dL — ABNORMAL LOW (ref 70–99)
Potassium: 8.5 mEq/L (ref 3.5–5.1)
Potassium: 3.8 mEq/L (ref 3.5–5.1)
Sodium: 136 mEq/L (ref 135–145)
Sodium: 137 mEq/L (ref 135–145)
TOTAL PROTEIN: 7.5 g/dL (ref 6.0–8.3)
Total Bilirubin: 0.6 mg/dL (ref 0.2–1.2)
Total Protein: 8 g/dL (ref 6.0–8.3)

## 2014-03-26 LAB — CBC
HCT: 41.9 % (ref 39.0–52.0)
HCT: 41 % (ref 39.0–52.0)
Hemoglobin: 14.5 g/dL (ref 13.0–17.0)
Hemoglobin: 13.9 g/dL (ref 13.0–17.0)
MCH: 29.6 pg (ref 26.0–34.0)
MCHC: 34.7 g/dL (ref 30.0–36.0)
MCHC: 33.9 g/dL (ref 30.0–36.0)
MCV: 86.5 fl (ref 78.0–100.0)
MCV: 87.4 fL (ref 78.0–100.0)
PLATELETS: 238 10*3/uL (ref 150.0–400.0)
PLATELETS: 211 10*3/uL (ref 150–400)
RBC: 4.85 Mil/uL (ref 4.22–5.81)
RBC: 4.69 MIL/uL (ref 4.22–5.81)
RDW: 14.1 % (ref 11.5–15.5)
RDW: 13.4 % (ref 11.5–15.5)
WBC: 5.1 10*3/uL (ref 4.0–10.5)
WBC: 5.1 10*3/uL (ref 4.0–10.5)

## 2014-03-26 LAB — I-STAT TROPONIN, ED: TROPONIN I, POC: 0.03 ng/mL (ref 0.00–0.08)

## 2014-03-26 LAB — BASIC METABOLIC PANEL
Anion gap: 8 (ref 5–15)
BUN: 32 mg/dL — AB (ref 6–23)
CALCIUM: 9.8 mg/dL (ref 8.4–10.5)
CO2: 23 mmol/L (ref 19–32)
Chloride: 108 mmol/L (ref 96–112)
Creatinine, Ser: 2.02 mg/dL — ABNORMAL HIGH (ref 0.50–1.35)
GFR calc Af Amer: 38 mL/min — ABNORMAL LOW (ref >90)
GFR, EST NON AFRICAN AMERICAN: 33 mL/min — AB (ref >90)
Glucose, Bld: 115 mg/dL — ABNORMAL HIGH (ref 70–99)
Potassium: 3.9 mmol/L (ref 3.5–5.1)
SODIUM: 139 mmol/L (ref 135–145)

## 2014-03-26 LAB — TSH: TSH: 6.77 u[IU]/mL — ABNORMAL HIGH (ref 0.35–4.50)

## 2014-03-26 NOTE — Telephone Encounter (Signed)
Received critical lab result on this patient -- potassium 8.5.  Lab reported that this is not an accurate specimen as specimen was not spun correctly and likely not an accurate value.  Spoke with Shanon Brow in our lab who received the call -- he is calling patient to have him come in for repeat BMP.   Will you follow-up on this as I am not in office.  If patient cannot come in to lab today, please assess for any palpitation, dizziness, confusion, etc.  If present he is to have someone take him to ER for assessment.  If feeling well, we will proceed with repeat lab.

## 2014-03-26 NOTE — ED Notes (Signed)
Pt presents productive cough for the past week with weakness that has gotten progressively worse.  Pt is on continuous Milrinone drip followed by cardiology.  Pt saw PCP 2 days ago for symptoms and had blood drawn with critical values- redraw today shows stable results.

## 2014-03-26 NOTE — Telephone Encounter (Signed)
Called patient on [facility] # [phone].  No answer, left message to return call, will retry today.

## 2014-03-26 NOTE — Telephone Encounter (Signed)
Patient called in regarding this and he states that he cannot come in before 5pm. [facility] # [phone]

## 2014-03-26 NOTE — Addendum Note (Signed)
Addended by: Peggyann Shoals on: 03/26/2014 04:29 PM   Modules accepted: Orders

## 2014-03-26 NOTE — Telephone Encounter (Signed)
Spoke with patient and he stated that he went to [facility] lab to have his labs redrawn (repeat K was 3.8).

## 2014-03-26 NOTE — ED Provider Notes (Signed)
CSN: [id number]     Arrival date & time 03/26/14  2216 History   This chart was scribed for Everlene Balls, MD by Randa Evens, ED Scribe. This patient was seen in room A07C/A07C and the patient's care was started at 11:28 PM.      Chief Complaint  Patient presents with  . Weakness  . Cough   HPI HPI Comments: [name] is a 64 y.o. male who presents to the Emergency Department complaining of new weakness onset 2 days prior. Pt states that he has associated cough, CP, SOB and dizziness. Pt states that today his PCP called him and to come to ED for his potassium level off. Pt states that coughing makes the CP worse. Pt states that he has been having to sit up more than usual because he feels more SOB when laying flat. Pt denies any medications PTA. Pt denies having appetite. Pt denies fever, vomiting or diarrhea.   Past Medical History  Diagnosis Date  . CHF (congestive heart failure)   . Sarcoidosis   . Cardiomyopathy, dilated, nonischemic     non ischemic by cath  . Acute on chronic systolic heart failure   . Automatic implantable cardiac defibrillator in situ   . Atrial fibrillation   . NSVT (nonsustained ventricular tachycardia)   . GERD (gastroesophageal reflux disease)   . Hypercholesteremia   . Shortness of breath   . Chronic kidney disease (CKD), stage III (moderate)   . Pacemaker   . Anginal pain   . Gout   . Hypertension     dr [name]  . Coronary artery disease   . Elevated PSA 06/24/2013  . Myocardial infarction   . Headache   . Neuropathy    Past Surgical History  Procedure Laterality Date  . Pacemaker insertion  2009    with ICD  . Tee without cardioversion  01/17/2011    Procedure: TRANSESOPHAGEAL ECHOCARDIOGRAM (TEE);  Surgeon: Birdie Riddle, MD;  Location: [place];  Service: Cardiovascular;  Laterality: N/A;  . Cardioversion  01/17/2011    Procedure: CARDIOVERSION;  Surgeon: Birdie Riddle, MD;  Location: [place];  Service: Cardiovascular;   Laterality: N/A;  . Cardiac catheterization    . Insert / replace / remove pacemaker    . Anterior cervical decomp/discectomy fusion  08/21/2011    Procedure: ANTERIOR CERVICAL DECOMPRESSION/DISCECTOMY FUSION 2 LEVELS;  Surgeon: Marybelle Killings, MD;  Location: [place];  Service: Orthopedics;  Laterality: N/A;  C5-6, C6-7 Anterior Cervical Discectomy and Fusion, allograft, plate  . Tee without cardioversion N/A 02/16/2012    Procedure: TRANSESOPHAGEAL ECHOCARDIOGRAM (TEE);  Surgeon: Jolaine Artist, MD;  Location: [facility] ENDOSCOPY;  Service: Cardiovascular;  Laterality: N/A;  original case scheduled under his dad (who is deceased), rescheduled under correct mrn/pt/dob. [place]/dl  . Tee without cardioversion N/A 03/22/2012    Procedure: TRANSESOPHAGEAL ECHOCARDIOGRAM (TEE);  Surgeon: Lelon Perla, MD;  Location: [employer];  Service: Cardiovascular;  Laterality: N/A;  . Cardioversion N/A 03/22/2012    Procedure: CARDIOVERSION;  Surgeon: Lelon Perla, MD;  Location: [facility] ENDOSCOPY;  Service: Cardiovascular;  Laterality: N/A;  . Cardioversion N/A 04/26/2012    Procedure: CARDIOVERSION;  Surgeon: Jolaine Artist, MD;  Location: [facility] ENDOSCOPY;  Service: Cardiovascular;  Laterality: N/A;  . Central venous catheter tunneled insertion single lumen  09/16/2012    right IJ  . Cardioversion N/A 11/08/2012    Procedure: CARDIOVERSION;  Surgeon: Jolaine Artist, MD;  Location: [place];  Service: Cardiovascular;  Laterality: N/A;  . Back surgery  1987, lower    Ruptured disk repair  . Peripherally inserted central catheter insertion  2013  . Right heart catheterization N/A 01/26/2012    Procedure: RIGHT HEART CATH;  Surgeon: Jolaine Artist, MD;  Location: [facility] CATH LAB;  Service: Cardiovascular;  Laterality: N/A;  . Right heart catheterization N/A 04/30/2013    Procedure: RIGHT HEART CATH;  Surgeon: Jolaine Artist, MD;  Location: [facility] CATH LAB;  Service: Cardiovascular;  Laterality: N/A;   Family  History  Problem Relation Age of Onset  . Heart disease    . Heart failure    . Stroke    . Anesthesia problems Neg Hx   . Hypotension Neg Hx   . Malignant hyperthermia Neg Hx   . Pseudochol deficiency Neg Hx    History  Substance Use Topics  . Smoking status: Never Smoker   . Smokeless tobacco: Never Used  . Alcohol Use: No    Review of Systems  Constitutional: Negative for fever.  Respiratory: Positive for cough and shortness of breath.   Cardiovascular: Positive for chest pain.  Gastrointestinal: Negative for vomiting and diarrhea.  Neurological: Positive for dizziness.  All other systems reviewed and are negative.    Allergies  Nitroglycerin and Colchicine  Home Medications   Prior to Admission medications   Medication Sig Start Date End Date Taking? Authorizing Provider  allopurinol (ZYLOPRIM) 100 MG tablet Take 1 tablet (100 mg total) by mouth daily. 02/05/12  Yes Wynetta Emery, PA-C  ALPRAZolam Duanne Moron) 0.5 MG tablet Take 1 tablet (0.5 mg total) by mouth 3 (three) times daily as needed for anxiety. 03/09/14  Yes Debbrah Alar, NP  amiodarone (PACERONE) 200 MG tablet Take 1 tablet (200 mg total) by mouth 2 (two) times daily. 01/15/14  Yes Amy D Clegg, NP  apixaban (ELIQUIS) 5 MG TABS tablet Take 1 tablet (5 mg total) by mouth 2 (two) times daily. 10/27/13  Yes Larey Dresser, MD  carvedilol (COREG) 6.25 MG tablet Take 6.25 mg by mouth daily.   Yes Historical Provider, MD  diphenhydrAMINE (BENADRYL) 50 MG capsule Take 50 mg by mouth daily as needed for itching.   Yes Historical Provider, MD  eplerenone (INSPRA) 25 MG tablet Take 12.5 mg by mouth every other day.  09/11/13  Yes Rande Brunt, NP  esomeprazole (NEXIUM) 40 MG capsule Take 40 mg by mouth 2 (two) times daily as needed (reflux).    Yes Historical Provider, MD  fluticasone (FLONASE) 50 MCG/ACT nasal spray Place 2 sprays into both nostrils daily as needed for allergies. 10/17/13  Yes Debbrah Alar, NP   Gabapentin Enacarbil ER (HORIZANT) 300 MG TBCR Take 300 mg by mouth at bedtime as needed (pain).    Yes Historical Provider, MD  hydrOXYzine (ATARAX/VISTARIL) 25 MG tablet Take 1 tablet (25 mg total) by mouth 2 (two) times daily as needed. 09/15/13  Yes Shaune Pascal Bensimhon, MD  ketoconazole (NIZORAL) 2 % cream Apply 1 application topically 2 (two) times daily. X 2 weeks. 03/25/14  Yes Brunetta Jeans, PA-C  Magnesium Oxide 400 (240 MG) MG TABS Take 1 tablet by mouth 2 (two) times daily. Patient taking differently: Take 400 mg by mouth daily.  09/11/13  Yes Rande Brunt, NP  metolazone (ZAROXOLYN) 2.5 MG tablet Take 1 tablet (2.5 mg total) by mouth daily as needed (for fluid retention). 08/27/13  Yes Jolaine Artist, MD  milrinone (PRIMACOR) 20 MG/100ML SOLN infusion Inject 0.375  mcg/kg/min into the vein continuous. 440mg  in 423ml infusion 2.5 ml per hour   Yes Historical Provider, MD  Oxycodone HCl 20 MG TABS Take 1 tablet (20 mg total) by mouth 3 (three) times daily as needed (pain.). Patient taking differently: Take 20 mg by mouth 3 (three) times daily as needed (pain.).  12/10/13  Yes Kelvin Cellar, MD  potassium chloride SA (K-DUR,KLOR-CON) 20 MEQ tablet Take 2 tablets (40 mEq total) by mouth daily. 02/09/14  Yes Amy D Clegg, NP  promethazine (PHENERGAN) 25 MG tablet Take 25 mg by mouth every 6 (six) hours as needed for nausea or vomiting.   Yes Historical Provider, MD  ranitidine (ZANTAC) 150 MG capsule Take 150 mg by mouth daily as needed for heartburn (reflux).   Yes Historical Provider, MD  senna-docusate (SENOKOT-S) 8.6-50 MG per tablet Take 1-2 tablets by mouth 2 (two) times daily as needed for mild constipation or moderate constipation. 10/21/13  Yes Debbrah Alar, NP  sertraline (ZOLOFT) 50 MG tablet Take 1 tablet (50 mg total) by mouth daily. 03/09/14  Yes Debbrah Alar, NP  sucralfate (CARAFATE) 1 GM/10ML suspension Take 10 mLs (1 g total) by mouth 4 (four) times daily. Patient  taking differently: Take 1 g by mouth 2 (two) times daily.  09/09/13  Yes Jessica D Zehr, PA-C  torsemide (DEMADEX) 20 MG tablet Take 2 tablets (40 mg total) by mouth daily. Patient taking differently: Take 40 mg by mouth daily. Take [3] tabs in Am and [2] tabs in Evening 02/24/14  Yes Amy D Clegg, NP  zolpidem (AMBIEN) 5 MG tablet Take 1.5 tablets (7.5 mg total) by mouth at bedtime. 03/25/14  Yes Brunetta Jeans, PA-C   BP 128/81 mmHg  Pulse 60  Temp(Src) 99 F (37.2 C) (Oral)  Resp 16  Ht 6\' 3"  (1.905 m)  Wt 242 lb (109.77 kg)  BMI 30.25 kg/m2  SpO2 96%   Physical Exam  Constitutional: He is oriented to person, place, and time. Vital signs are normal. He appears well-developed and well-nourished.  Non-toxic appearance. He does not appear ill. No distress.  HENT:  Head: Normocephalic and atraumatic.  Nose: Nose normal.  Mouth/Throat: Oropharynx is clear and moist. No oropharyngeal exudate.  Eyes: Conjunctivae and EOM are normal. Pupils are equal, round, and reactive to light. No scleral icterus.  Neck: Normal range of motion. Neck supple. No tracheal deviation, no edema, no erythema and normal range of motion present. No thyroid mass and no thyromegaly present.  Cardiovascular: Regular rhythm, S1 normal, S2 normal, normal heart sounds, intact distal pulses and normal pulses.  Tachycardia present.  Exam reveals no gallop and no friction rub.   No murmur heard. Pulses:      Radial pulses are 2+ on the right side, and 2+ on the left side.       Dorsalis pedis pulses are 2+ on the right side, and 2+ on the left side.  Pulmonary/Chest: Effort normal and breath sounds normal. No respiratory distress. He has no wheezes. He has no rhonchi. He has no rales.  Right chest wall central line thats clean, dry, intact and no warmth.   Abdominal: Soft. Normal appearance and bowel sounds are normal. He exhibits no distension, no ascites and no mass. There is no hepatosplenomegaly. There is no tenderness.  There is no rebound, no guarding and no CVA tenderness.  Musculoskeletal: Normal range of motion. He exhibits no edema or tenderness.  Lymphadenopathy:    He has no cervical adenopathy.  Neurological: He is alert and oriented to person, place, and time. He has normal strength. No cranial nerve deficit or sensory deficit.  Skin: Skin is warm, dry and intact. No petechiae and no rash noted. He is not diaphoretic. No erythema. No pallor.  Psychiatric: He has a normal mood and affect. His behavior is normal. Judgment normal.  Nursing note and vitals reviewed.   ED Course  Procedures (including critical care time) DIAGNOSTIC STUDIES: Oxygen Saturation is 96% on RA, normal by my interpretation.    COORDINATION OF CARE: 11:39 PM-Discussed treatment plan with pt at bedside and pt agreed to plan.     Labs Review Labs Reviewed  BASIC METABOLIC PANEL - Abnormal; Notable for the following:    Glucose, Bld 115 (*)    BUN 32 (*)    Creatinine, Ser 2.02 (*)    GFR calc non Af Amer 33 (*)    GFR calc Af Amer 38 (*)    All other components within normal limits  BRAIN NATRIURETIC PEPTIDE - Abnormal; Notable for the following:    B Natriuretic Peptide 112.9 (*)    All other components within normal limits  CBC  I-STAT TROPOININ, ED    Imaging Review Dg Chest 2 View  03/26/2014   CLINICAL DATA:  Cough and congestion for 2 days.  Weakness.  EXAM: CHEST  2 VIEW  COMPARISON:  10/10/2013  FINDINGS: Tip of the right central line in the mid SVC. Single lead pacemaker remains in place, lead projecting over the right ventricle. The heart size and mediastinal contours are unchanged, mild tortuosity of the thoracic aorta. The lungs are clear. No consolidation, pleural effusion, or pneumothorax. No acute osseous abnormalities are seen, mild degenerative change in the spine.  IMPRESSION: No acute pulmonary process.   Electronically Signed   By: Jeb Levering M.D.   On: 03/26/2014 23:10     EKG  Interpretation   Date/Time:  Thursday March 26 2014 22:27:43 EDT Ventricular Rate:  119 PR Interval:    QRS Duration: 106 QT Interval:  390 QTC Calculation: 548 R Axis:   -60 Text Interpretation:  Accelerated Junctional rhythm with Premature  ventricular complexes or Fusion complexes Left anterior fascicular block  Prolonged QT Abnormal ECG Confirmed by Glynn Octave [phone]) on  03/26/2014 11:35:28 PM      MDM   Final diagnoses:  None   patient does emergency department for productive cough over the past week. His primary care physician also had a potassium level VIII.5 and told patient to come to the emergency department. This is likely hemolyzed specimen as potassium level here is 3.9. Creatinine is stable at 2.02. Chest x-ray does not show any pneumonia. Patient likely has viral URI like symptoms. Patient was advised on supportive therapy, his vital signs may within his normal limits and the patient is safe for discharge for primary care follow-up within 3 days.   I personally performed the services described in this documentation, which was scribed in my presence. The recorded information has been reviewed and is accurate.      Everlene Balls, MD 03/27/14 [phone]

## 2014-03-26 NOTE — Telephone Encounter (Signed)
Thank you both for your swift work.  Please let patient know repeat lab looked good.  Will call with rest of lab results when all are back.

## 2014-03-26 NOTE — Telephone Encounter (Signed)
[facility] called stating that pts potassium was critical at 8.5 but was elevated possibly due to lab not being spun down when sent.  Spoke with pt , pt agrees to go to [facility] today  to have test repeated . Lab ordered as stat.

## 2014-03-27 LAB — BRAIN NATRIURETIC PEPTIDE: B NATRIURETIC PEPTIDE 5: 112.9 pg/mL — AB (ref 0.0–100.0)

## 2014-03-27 NOTE — Discharge Instructions (Signed)
Upper Respiratory Infection, Adult [name], your potassium level here was normal, the previous value was due to a lab error. Follow-up with your primary care physician within 3 days for continued management of your upper respiratory infection. If symptoms worsen come back to the emergency department immediately. Thank you. An upper respiratory infection (URI) is also known as the common cold. It is often caused by a type of germ (virus). Colds are easily spread (contagious). You can pass it to others by kissing, coughing, sneezing, or drinking out of the same glass. Usually, you get better in 1 or 2 weeks.  HOME CARE   Only take medicine as told by your doctor.  Use a warm mist humidifier or breathe in steam from a hot shower.  Drink enough water and fluids to keep your pee (urine) clear or pale yellow.  Get plenty of rest.  Return to work when your temperature is back to normal or as told by your doctor. You may use a face mask and wash your hands to stop your cold from spreading. GET HELP RIGHT AWAY IF:   After the first few days, you feel you are getting worse.  You have questions about your medicine.  You have chills, shortness of breath, or brown or red spit (mucus).  You have yellow or brown snot (nasal discharge) or pain in the face, especially when you bend forward.  You have a fever, puffy (swollen) neck, pain when you swallow, or white spots in the back of your throat.  You have a bad headache, ear pain, sinus pain, or chest pain.  You have a high-pitched whistling sound when you breathe in and out (wheezing).  You have a lasting cough or cough up blood.  You have sore muscles or a stiff neck. MAKE SURE YOU:   Understand these instructions.  Will watch your condition.  Will get help right away if you are not doing well or get worse. Document Released: 06/07/2007 Document Revised: 03/13/2011 Document Reviewed: 03/26/2013 [facility] Patient Information 2015  [place], [place]. This information is not intended to replace advice given to you by your health care provider. Make sure you discuss any questions you have with your health care provider.

## 2014-04-02 DIAGNOSIS — I5022 Chronic systolic (congestive) heart failure: Secondary | ICD-10-CM | POA: Diagnosis not present

## 2014-04-02 DIAGNOSIS — R0602 Shortness of breath: Secondary | ICD-10-CM | POA: Diagnosis not present

## 2014-04-02 DIAGNOSIS — I509 Heart failure, unspecified: Secondary | ICD-10-CM | POA: Diagnosis not present

## 2014-04-02 DIAGNOSIS — I5023 Acute on chronic systolic (congestive) heart failure: Secondary | ICD-10-CM | POA: Diagnosis not present

## 2014-04-07 ENCOUNTER — Ambulatory Visit (HOSPITAL_COMMUNITY)
Admission: RE | Admit: 2014-04-07 | Discharge: 2014-04-07 | Disposition: A | Discharge status: Home / Self Care | Payer: Medicare Other | Source: Ambulatory Visit | Attending: Cardiology | Admitting: Cardiology

## 2014-04-07 ENCOUNTER — Encounter (HOSPITAL_COMMUNITY): Payer: Self-pay

## 2014-04-07 DIAGNOSIS — E780 Pure hypercholesterolemia: Secondary | ICD-10-CM | POA: Diagnosis not present

## 2014-04-07 DIAGNOSIS — I429 Cardiomyopathy, unspecified: Secondary | ICD-10-CM | POA: Diagnosis not present

## 2014-04-07 DIAGNOSIS — I2510 Atherosclerotic heart disease of native coronary artery without angina pectoris: Secondary | ICD-10-CM | POA: Diagnosis not present

## 2014-04-07 DIAGNOSIS — I129 Hypertensive chronic kidney disease with stage 1 through stage 4 chronic kidney disease, or unspecified chronic kidney disease: Secondary | ICD-10-CM | POA: Diagnosis not present

## 2014-04-07 DIAGNOSIS — D869 Sarcoidosis, unspecified: Secondary | ICD-10-CM | POA: Insufficient documentation

## 2014-04-07 DIAGNOSIS — K219 Gastro-esophageal reflux disease without esophagitis: Secondary | ICD-10-CM | POA: Diagnosis not present

## 2014-04-07 DIAGNOSIS — I5022 Chronic systolic (congestive) heart failure: Secondary | ICD-10-CM | POA: Insufficient documentation

## 2014-04-07 DIAGNOSIS — I480 Paroxysmal atrial fibrillation: Secondary | ICD-10-CM | POA: Diagnosis not present

## 2014-04-07 DIAGNOSIS — M109 Gout, unspecified: Secondary | ICD-10-CM | POA: Insufficient documentation

## 2014-04-07 DIAGNOSIS — Z79899 Other long term (current) drug therapy: Secondary | ICD-10-CM | POA: Diagnosis not present

## 2014-04-07 DIAGNOSIS — G629 Polyneuropathy, unspecified: Secondary | ICD-10-CM | POA: Insufficient documentation

## 2014-04-07 DIAGNOSIS — N183 Chronic kidney disease, stage 3 (moderate): Secondary | ICD-10-CM | POA: Insufficient documentation

## 2014-04-07 DIAGNOSIS — I252 Old myocardial infarction: Secondary | ICD-10-CM | POA: Insufficient documentation

## 2014-04-07 DIAGNOSIS — Z95810 Presence of automatic (implantable) cardiac defibrillator: Secondary | ICD-10-CM | POA: Diagnosis not present

## 2014-04-07 DIAGNOSIS — Z7901 Long term (current) use of anticoagulants: Secondary | ICD-10-CM | POA: Insufficient documentation

## 2014-04-07 DIAGNOSIS — G8929 Other chronic pain: Secondary | ICD-10-CM | POA: Insufficient documentation

## 2014-04-07 LAB — BASIC METABOLIC PANEL
Anion gap: 10 (ref 5–15)
BUN: 42 mg/dL — AB (ref 6–23)
CHLORIDE: 105 mmol/L (ref 96–112)
CO2: 25 mmol/L (ref 19–32)
Calcium: 9.2 mg/dL (ref 8.4–10.5)
Creatinine, Ser: 2.37 mg/dL — ABNORMAL HIGH (ref 0.50–1.35)
GFR calc Af Amer: 32 mL/min — ABNORMAL LOW (ref >90)
GFR, EST NON AFRICAN AMERICAN: 27 mL/min — AB (ref >90)
GLUCOSE: 122 mg/dL — AB (ref 70–99)
Potassium: 3.4 mmol/L — ABNORMAL LOW (ref 3.5–5.1)
SODIUM: 140 mmol/L (ref 135–145)

## 2014-04-07 LAB — HEPATIC FUNCTION PANEL
ALT: 45 U/L (ref 0–53)
AST: 61 U/L — ABNORMAL HIGH (ref 0–37)
Albumin: 3.5 g/dL (ref 3.5–5.2)
Alkaline Phosphatase: 93 U/L (ref 39–117)
BILIRUBIN DIRECT: 0.1 mg/dL (ref 0.0–0.5)
Indirect Bilirubin: 0.9 mg/dL (ref 0.3–0.9)
Total Bilirubin: 1 mg/dL (ref 0.3–1.2)
Total Protein: 7.5 g/dL (ref 6.0–8.3)

## 2014-04-07 LAB — CBC
HCT: 43.4 % (ref 39.0–52.0)
Hemoglobin: 14.6 g/dL (ref 13.0–17.0)
MCH: 29.4 pg (ref 26.0–34.0)
MCHC: 33.6 g/dL (ref 30.0–36.0)
MCV: 87.5 fL (ref 78.0–100.0)
Platelets: 244 10*3/uL (ref 150–400)
RBC: 4.96 MIL/uL (ref 4.22–5.81)
RDW: 13.7 % (ref 11.5–15.5)
WBC: 4.8 10*3/uL (ref 4.0–10.5)

## 2014-04-07 LAB — MAGNESIUM: MAGNESIUM: 1.7 mg/dL (ref 1.5–2.5)

## 2014-04-07 LAB — TSH: TSH: 5.36 u[IU]/mL — ABNORMAL HIGH (ref 0.350–4.500)

## 2014-04-07 LAB — BRAIN NATRIURETIC PEPTIDE: B NATRIURETIC PEPTIDE 5: 85.8 pg/mL (ref 0.0–100.0)

## 2014-04-07 MED ORDER — CARVEDILOL 6.25 MG PO TABS: 6.2500 mg | ORAL_TABLET | Freq: Two times a day (BID) | ORAL | Status: DC

## 2014-04-07 MED ORDER — SILDENAFIL CITRATE 100 MG PO TABS: 100.0000 mg | ORAL_TABLET | Freq: Every day | ORAL | Status: DC | PRN

## 2014-04-07 NOTE — Progress Notes (Signed)
Patient ID: [name], male   DOB: February 26, 1950, 64 y.o.   MRN: [id number] PCP: Debbrah Alar   HPI: Mr. [place] is a 64 y.o. 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 He also has history of ventricular tachycardia s/p St Jude ICD (single lead, no Corevue) by Dr. Lovena Le, CKD stage IV, and paroxysmal atrial arrhythmias on amiodarone as well as sarcoidosis and hypertension. Cath in 2008 by Dr. Terrence Dupont showed no CAD.  Seen at [facility] and not felt to be a transplant candidate due to renal failure and lack of family support.  Milrinone initiated in January 2014 for low output. 03/01/12 milrinone increased 0.375 mcg/kg/min.   04/26/12 S/P Successful DC-CV for atrial fibrillation. Remains on amiodarone.   Admitted to [facility] 01/09/14-01/15/2014 with an infected PICC. Gram negative bacteremia with antibiotic course. Hospitalization was complicated by VT/VF. Discharge weight was 240 pounds.   He has felt worse over the last week or so.  Under a lot of stress at home.  Feels like he may be back in atrial fibrillation today but ECG shows NSR.  General lack of energy.  Short of breath after walking about 100 feet.  No lightheadedness or chest pain. Weight is down 2 lbs.   ECG: NSR, 1st degree AV block, LAFB, QTc 605  ECHO 03/22/12 EF 15%  Echo 6/15: 20-25%  RHC 04/30/13 RA = 4  RV = 20/1/3  PA = 23/8 (15)  PCW = 5  Fick cardiac output/index =4.5/1.9  Thermo CO/CI = 3.9/1.6  PVR =  FA sat = 98%  PA sat = 62%, 64% - On milrinone 0.25  He is not on an ace inhibitor due to elevated creatinine. Not on nitrates due to Viagra use.   Labs:  08/15/12 Creatinine 4.24 Potassium 3.3 09/19/12 Creatinine 3.98, Potassium 3.1, BUN 78 10/07/12 Creatinine 2.62, Potassium 3.4 10/18/12 Creatinine 3.3, K+3.1 11/08/12 Creatinine 2.15 K 4.0 11/25/12 Creatinine 2.93, K+ 3.5 03/10/13: K+ 2.9, Creatinine 2.63 04/30/13: K+ 3.7, creatinine  2.4 6/15: K 3, creatinine 3.6 09/11/13: K 3.1 Cr 1.87  10/15: K 3.8, creatinine 2.13, HCT 40.7 11/06/13 K 4.0 Creatinine 1.58  12/17/13 K 3.6 Creatinine 2.3  3/16 K 3.9, creatinine 2.02  ROS: All systems negative except as listed in HPI, PMH and Problem List.  Past Medical History  Diagnosis Date  . CHF (congestive heart failure)   . Sarcoidosis   . Cardiomyopathy, dilated, nonischemic     non ischemic by cath  . Acute on chronic systolic heart failure   . Automatic implantable cardiac defibrillator in situ   . Atrial fibrillation   . NSVT (nonsustained ventricular tachycardia)   . GERD (gastroesophageal reflux disease)   . Hypercholesteremia   . Shortness of breath   . Chronic kidney disease (CKD), stage III (moderate)   . Pacemaker   . Anginal pain   . Gout   . Hypertension     dr [name]  . Coronary artery disease   . Elevated PSA 06/24/2013  . Myocardial infarction   . Headache   . Neuropathy    Current Outpatient Prescriptions  Medication Sig Dispense Refill  . allopurinol (ZYLOPRIM) 100 MG tablet Take 1 tablet (100 mg total) by mouth daily. 30 tablet 6  . ALPRAZolam (XANAX) 0.5 MG tablet Take 1 tablet (0.5 mg total) by mouth 3 (three) times daily as needed for anxiety. 60 tablet 0  . amiodarone (PACERONE) 200 MG tablet Take 1 tablet (  200 mg total) by mouth 2 (two) times daily. 60 tablet 6  . apixaban (ELIQUIS) 5 MG TABS tablet Take 1 tablet (5 mg total) by mouth 2 (two) times daily. 60 tablet 4  . carvedilol (COREG) 6.25 MG tablet Take 1 tablet (6.25 mg total) by mouth 2 (two) times daily with a meal. 60 tablet 3  . diphenhydrAMINE (BENADRYL) 50 MG capsule Take 50 mg by mouth daily as needed for itching.    Marland Kitchen eplerenone (INSPRA) 25 MG tablet Take 12.5 mg by mouth every other day.     . esomeprazole (NEXIUM) 40 MG capsule Take 40 mg by mouth 2 (two) times daily as needed (reflux).     . fluticasone (FLONASE) 50 MCG/ACT nasal spray Place 2 sprays into both nostrils  daily as needed for allergies. 16 g 2  . Gabapentin Enacarbil ER (HORIZANT) 300 MG TBCR Take 300 mg by mouth at bedtime as needed (pain).     Marland Kitchen ketoconazole (NIZORAL) 2 % cream Apply 1 application topically 2 (two) times daily. X 2 weeks. 15 g 0  . Magnesium Oxide 400 (240 MG) MG TABS Take 1 tablet by mouth 2 (two) times daily. (Patient taking differently: Take 400 mg by mouth daily. ) 60 tablet 3  . metolazone (ZAROXOLYN) 2.5 MG tablet Take 1 tablet (2.5 mg total) by mouth daily as needed (for fluid retention). 10 tablet 6  . milrinone (PRIMACOR) 20 MG/100ML SOLN infusion Inject 0.375 mcg/kg/min into the vein continuous. 440mg  in 432ml infusion 2.5 ml per hour    . Oxycodone HCl 20 MG TABS Take 1 tablet (20 mg total) by mouth 3 (three) times daily as needed (pain.). (Patient taking differently: Take 20 mg by mouth 3 (three) times daily as needed (pain.). ) 15 tablet 0  . potassium chloride SA (K-DUR,KLOR-CON) 20 MEQ tablet Take 2 tablets (40 mEq total) by mouth daily. 60 tablet 6  . promethazine (PHENERGAN) 25 MG tablet Take 25 mg by mouth every 6 (six) hours as needed for nausea or vomiting.    . ranitidine (ZANTAC) 150 MG capsule Take 150 mg by mouth daily as needed for heartburn (reflux).    Marland Kitchen senna-docusate (SENOKOT-S) 8.6-50 MG per tablet Take 1-2 tablets by mouth 2 (two) times daily as needed for mild constipation or moderate constipation. 60 tablet 3  . sertraline (ZOLOFT) 50 MG tablet Take 1 tablet (50 mg total) by mouth daily. 30 tablet 3  . sucralfate (CARAFATE) 1 GM/10ML suspension Take 10 mLs (1 g total) by mouth 4 (four) times daily. (Patient taking differently: Take 1 g by mouth 2 (two) times daily. ) 420 mL 1  . torsemide (DEMADEX) 20 MG tablet Take 2 tablets (40 mg total) by mouth daily. (Patient taking differently: Take 40 mg by mouth daily. Take [3] tabs in Am and [2] tabs in Evening) 60 tablet 6  . zolpidem (AMBIEN) 5 MG tablet Take 1.5 tablets (7.5 mg total) by mouth at bedtime. 45  tablet 0  . hydrOXYzine (ATARAX/VISTARIL) 25 MG tablet Take 1 tablet (25 mg total) by mouth 2 (two) times daily as needed. (Patient not taking: Reported on 04/07/2014) 60 tablet 2  . sildenafil (VIAGRA) 100 MG tablet Take 1 tablet (100 mg total) by mouth daily as needed for erectile dysfunction. 10 tablet 3   No current facility-administered medications for this encounter.   Filed Vitals:   04/07/14 0841  BP: 114/62  Pulse: 109  Weight: 245 lb (111.131 kg)  SpO2: 96%  PHYSICAL EXAM: General: NAD; Chronically ill appearing.   HEENT: normal  Neck: supple. JVP flat. Carotids 2+ bilat; no bruits. No lymphadenopathy or thyromegaly appreciated.  Cor: PMI laterally displaced. Regular S1 S2 with no S3 and 2/6 HSM apex. R upper chest hickman site with milrinone infusing.  Lungs: clear Abdomen: soft, nontender, obese. No bruits or masses. Good bowel sounds.  Mild abdominal distention.  Extremities: no cyanosis, clubbing, rash, no lower extremity edema  Neuro: alert & orientedx3, cranial nerves grossly intact. moves all 4 extremities w/o difficulty. Affect pleasant   ASSESSMENT & PLAN:   1) Chronic systolic HF: Nonischemic cardiomyopathy, EF 20-25% (06/2013). St Jude ICD (single chamber, no Corevue). He has been on home milrinone 0.375 mcg/kg/min since January 2014. NYHA class III symptoms.  He feels more fatigued over the past week.  He is in NSR by ECG today.  Despite more fatigue, he does not appear volume overloaded on exam.   - Continue Coreg, eplerenone, torsemide at current doses.  - No ACEI with CKD - Continue milrinone at current dose.  2) Atrial arrhythmias: Paroxysmal. He is in NSR today.  Continue amiodarone and apixaban. CBC today.  Will check LFTs/TSH with amiodarone use.  Should get yearly eye exams.  3) CKD, stage IV: BMET today.    4) Chronic pain: Per pain clinic.  5) VT/VF during hospitalization 01/10/14 . On amiodarone 200 mg twice a day.    I will have him return in 2 wk.    Loralie Champagne 04/07/2014

## 2014-04-07 NOTE — Patient Instructions (Addendum)
INCREASE Carvedilol to 6.25mg  (1 tablet) twice daily. Rx sent to [facility] in [place].  Follow up 2 weeks.  Do the following things EVERYDAY: 1) Weigh yourself in the morning before breakfast. Write it down and keep it in a log. 2) Take your medicines as prescribed 3) Eat low salt foods-Limit salt (sodium) to 2000 mg per day.  4) Stay as active as you can everyday 5) Limit all fluids for the day to less than 2 liters

## 2014-04-08 ENCOUNTER — Ambulatory Visit (INDEPENDENT_AMBULATORY_CARE_PROVIDER_SITE_OTHER): Payer: Medicare Other | Admitting: Family

## 2014-04-08 ENCOUNTER — Encounter: Payer: Self-pay | Admitting: Family

## 2014-04-08 VITALS — BP 100/78 | HR 74 | Temp 97.9°F | Resp 18 | Ht 75.0 in | Wt 244.2 lb

## 2014-04-08 DIAGNOSIS — J302 Other seasonal allergic rhinitis: Secondary | ICD-10-CM | POA: Diagnosis not present

## 2014-04-08 DIAGNOSIS — F418 Other specified anxiety disorders: Secondary | ICD-10-CM | POA: Diagnosis not present

## 2014-04-08 DIAGNOSIS — I4892 Unspecified atrial flutter: Secondary | ICD-10-CM

## 2014-04-08 MED ORDER — SERTRALINE HCL 100 MG PO TABS: 100.0000 mg | ORAL_TABLET | Freq: Every day | ORAL | Status: DC

## 2014-04-08 NOTE — Progress Notes (Signed)
Subjective:    Patient ID: [name], male    DOB: [birth year], 64 y.o.   MRN: [id number]  HPI  Mr. Phaneuf is a 64 yr old male who presents today for follow up.  1) Anxiety/Depression- He reports some improvement in his anxiety since he has started zoloft daily.  Using xanax 2-3 x per week, last dose yesterday. Feeling some better but notes that he still has a lot of stress at home.  Reports that he yelled at his mother this am and felt badly about.  Reports sometimes he "just cries."  He has never worked with a Transport planner.  He is working with a SW to help get him in a better living situation.   2) Tachycardia- reports that he has been in and out of AF but seems worse this week. Saw HF clinic yesterday and his carvedilol was increased.    Notes + cough, sneezing in the AM.    Review of Systems    see HPI  Past Medical History  Diagnosis Date  . CHF (congestive heart failure)   . Sarcoidosis   . Cardiomyopathy, dilated, nonischemic     non ischemic by cath  . Acute on chronic systolic heart failure   . Automatic implantable cardiac defibrillator in situ   . Atrial fibrillation   . NSVT (nonsustained ventricular tachycardia)   . GERD (gastroesophageal reflux disease)   . Hypercholesteremia   . Shortness of breath   . Chronic kidney disease (CKD), stage III (moderate)   . Pacemaker   . Anginal pain   . Gout   . Hypertension     dr [name]  . Coronary artery disease   . Elevated PSA 06/24/2013  . Myocardial infarction   . Headache   . Neuropathy     History   Social History  . Marital Status: Single    Spouse Name: N/A  . Number of Children: N/A  . Years of Education: N/A   Occupational History  . Not on file.   Social History Main Topics  . Smoking status: Never Smoker   . Smokeless tobacco: Never Used  . Alcohol Use: No  . Drug Use: Yes    Special: "Crack" cocaine, Cocaine     Comment: last use august 2004  . Sexual Activity: Not Currently    Other Topics Concern  . Not on file   Social History Narrative   3 sons- one in [place], 1 in [place], on in [place]   Retired from post office- on disability   Completed Bachelors from [employer]    Past Surgical History  Procedure Laterality Date  . Pacemaker insertion  2009    with ICD  . Tee without cardioversion  01/17/2011    Procedure: TRANSESOPHAGEAL ECHOCARDIOGRAM (TEE);  Surgeon: Birdie Riddle, MD;  Location: [place];  Service: Cardiovascular;  Laterality: N/A;  . Cardioversion  01/17/2011    Procedure: CARDIOVERSION;  Surgeon: Birdie Riddle, MD;  Location: [place];  Service: Cardiovascular;  Laterality: N/A;  . Cardiac catheterization    . Insert / replace / remove pacemaker    . Anterior cervical decomp/discectomy fusion  08/21/2011    Procedure: ANTERIOR CERVICAL DECOMPRESSION/DISCECTOMY FUSION 2 LEVELS;  Surgeon: Marybelle Killings, MD;  Location: [place];  Service: Orthopedics;  Laterality: N/A;  C5-6, C6-7 Anterior Cervical Discectomy and Fusion, allograft, plate  . Tee without cardioversion N/A 02/16/2012    Procedure: TRANSESOPHAGEAL ECHOCARDIOGRAM (TEE);  Surgeon: Jolaine Artist, MD;  Location: [facility] ENDOSCOPY;  Service: Cardiovascular;  Laterality: N/A;  original case scheduled under his dad (who is deceased), rescheduled under correct mrn/pt/dob. [place]oweth/dl  . Tee without cardioversion N/A 03/22/2012    Procedure: TRANSESOPHAGEAL ECHOCARDIOGRAM (TEE);  Surgeon: Lelon Perla, MD;  Location: [place];  Service: Cardiovascular;  Laterality: N/A;  . Cardioversion N/A 03/22/2012    Procedure: CARDIOVERSION;  Surgeon: Lelon Perla, MD;  Location: [facility] ENDOSCOPY;  Service: Cardiovascular;  Laterality: N/A;  . Cardioversion N/A 04/26/2012    Procedure: CARDIOVERSION;  Surgeon: Jolaine Artist, MD;  Location: [facility] ENDOSCOPY;  Service: Cardiovascular;  Laterality: N/A;  . Central venous catheter tunneled insertion single lumen  09/16/2012    right IJ  . Cardioversion N/A 11/08/2012     Procedure: CARDIOVERSION;  Surgeon: Jolaine Artist, MD;  Location: [facility] ENDOSCOPY;  Service: Cardiovascular;  Laterality: N/A;  . Back surgery  1987, lower    Ruptured disk repair  . Peripherally inserted central catheter insertion  2013  . Right heart catheterization N/A 01/26/2012    Procedure: RIGHT HEART CATH;  Surgeon: Jolaine Artist, MD;  Location[facility] CATH LAB;  Service: Cardiovascular;  Laterality: N/A;  . Right heart catheterization N/A 04/30/2013    Procedure: RIGHT HEART CATH;  Surgeon: Jolaine Artist, MD;  Location: [facility] CATH LAB;  Service: Cardiovascular;  Laterality: N/A;    Family History  Problem Relation Age of Onset  . Heart disease    . Heart failure    . Stroke    . Anesthesia problems Neg Hx   . Hypotension Neg Hx   . Malignant hyperthermia Neg Hx   . Pseudochol deficiency Neg Hx     Allergies  Allergen Reactions  . Nitroglycerin Other (See Comments)    "feels like head is going to bust open"  . Colchicine Nausea Only    Current Outpatient Prescriptions on File Prior to Visit  Medication Sig Dispense Refill  . allopurinol (ZYLOPRIM) 100 MG tablet Take 1 tablet (100 mg total) by mouth daily. 30 tablet 6  . ALPRAZolam (XANAX) 0.5 MG tablet Take 1 tablet (0.5 mg total) by mouth 3 (three) times daily as needed for anxiety. 60 tablet 0  . amiodarone (PACERONE) 200 MG tablet Take 1 tablet (200 mg total) by mouth 2 (two) times daily. 60 tablet 6  . apixaban (ELIQUIS) 5 MG TABS tablet Take 1 tablet (5 mg total) by mouth 2 (two) times daily. 60 tablet 4  . carvedilol (COREG) 6.25 MG tablet Take 1 tablet (6.25 mg total) by mouth 2 (two) times daily with a meal. 60 tablet 3  . diphenhydrAMINE (BENADRYL) 50 MG capsule Take 50 mg by mouth daily as needed for itching.    Marland Kitchen eplerenone (INSPRA) 25 MG tablet Take 12.5 mg by mouth every other day.     . esomeprazole (NEXIUM) 40 MG capsule Take 40 mg by mouth 2 (two) times daily as needed (reflux).     . fluticasone  (FLONASE) 50 MCG/ACT nasal spray Place 2 sprays into both nostrils daily as needed for allergies. 16 g 2  . Gabapentin Enacarbil ER (HORIZANT) 300 MG TBCR Take 300 mg by mouth at bedtime as needed (pain).     . hydrOXYzine (ATARAX/VISTARIL) 25 MG tablet Take 1 tablet (25 mg total) by mouth 2 (two) times daily as needed. 60 tablet 2  . ketoconazole (NIZORAL) 2 % cream Apply 1 application topically 2 (two) times daily. X 2 weeks. 15 g 0  . Magnesium  Oxide 400 (240 MG) MG TABS Take 1 tablet by mouth 2 (two) times daily. (Patient taking differently: Take 400 mg by mouth daily. ) 60 tablet 3  . metolazone (ZAROXOLYN) 2.5 MG tablet Take 1 tablet (2.5 mg total) by mouth daily as needed (for fluid retention). 10 tablet 6  . milrinone (PRIMACOR) 20 MG/100ML SOLN infusion Inject 0.375 mcg/kg/min into the vein continuous. 440mg  in 462ml infusion 2.5 ml per hour    . Oxycodone HCl 20 MG TABS Take 1 tablet (20 mg total) by mouth 3 (three) times daily as needed (pain.). (Patient taking differently: Take 20 mg by mouth 3 (three) times daily as needed (pain.). ) 15 tablet 0  . potassium chloride SA (K-DUR,KLOR-CON) 20 MEQ tablet Take 2 tablets (40 mEq total) by mouth daily. 60 tablet 6  . promethazine (PHENERGAN) 25 MG tablet Take 25 mg by mouth every 6 (six) hours as needed for nausea or vomiting.    . ranitidine (ZANTAC) 150 MG capsule Take 150 mg by mouth daily as needed for heartburn (reflux).    Marland Kitchen senna-docusate (SENOKOT-S) 8.6-50 MG per tablet Take 1-2 tablets by mouth 2 (two) times daily as needed for mild constipation or moderate constipation. 60 tablet 3  . sertraline (ZOLOFT) 50 MG tablet Take 1 tablet (50 mg total) by mouth daily. 30 tablet 3  . sildenafil (VIAGRA) 100 MG tablet Take 1 tablet (100 mg total) by mouth daily as needed for erectile dysfunction. 10 tablet 3  . sucralfate (CARAFATE) 1 GM/10ML suspension Take 10 mLs (1 g total) by mouth 4 (four) times daily. (Patient taking differently: Take 1 g  by mouth 2 (two) times daily. ) 420 mL 1  . torsemide (DEMADEX) 20 MG tablet Take 2 tablets (40 mg total) by mouth daily. (Patient taking differently: Take 40 mg by mouth daily. Take [3] tabs in Am and [2] tabs in Evening) 60 tablet 6  . zolpidem (AMBIEN) 5 MG tablet Take 1.5 tablets (7.5 mg total) by mouth at bedtime. 45 tablet 0   No current facility-administered medications on file prior to visit.    BP 100/78 mmHg  Pulse 74  Temp(Src) 97.9 F (36.6 C) (Oral)  Resp 18  Ht 6\' 3"  (1.905 m)  Wt 244 lb 3.2 oz (110.768 kg)  BMI 30.52 kg/m2  SpO2 98%    Objective:   Physical Exam  Constitutional: He is oriented to person, place, and time. He appears well-developed and well-nourished. No distress.  HENT:  Head: Normocephalic and atraumatic.  Cardiovascular: Normal rate and regular rhythm.   No murmur heard. Pulmonary/Chest: Effort normal and breath sounds normal. No respiratory distress. He has no wheezes. He has no rales.  Musculoskeletal: He exhibits no edema.  Lymphadenopathy:    He has no cervical adenopathy.  Neurological: He is alert and oriented to person, place, and time.  Skin: Skin is warm and dry.  Psychiatric: He has a normal mood and affect. His behavior is normal. Thought content normal.          Assessment & Plan:

## 2014-04-08 NOTE — Assessment & Plan Note (Signed)
Improving, but still not optimally controlled. Refer to therapist and increase zoloft from 50mg  to 100mg . Continue prn xanax.

## 2014-04-08 NOTE — Progress Notes (Signed)
Pre visit review using our clinic review tool, if applicable. No additional management support is needed unless otherwise documented below in the visit note. 

## 2014-04-08 NOTE — Addendum Note (Signed)
Encounter addended by: Yehuda Mao on: 04/08/2014  9:49 AM<BR>     Documentation filed: Charges VN

## 2014-04-08 NOTE — Assessment & Plan Note (Signed)
Beta blocker increased by cardiology.

## 2014-04-08 NOTE — Assessment & Plan Note (Signed)
Add prn claritin.

## 2014-04-08 NOTE — Patient Instructions (Addendum)
Please contact [facility] to arrange an appointment with one of our therapists.  [phone] Increase zoloft from 50mg  to 100mg  once daily.   Please schedule a follow up appointment in 1 month. Add claritin 10mg  once daily as needed for allergy symptoms.

## 2014-04-09 DIAGNOSIS — I509 Heart failure, unspecified: Secondary | ICD-10-CM | POA: Diagnosis not present

## 2014-04-09 DIAGNOSIS — R0602 Shortness of breath: Secondary | ICD-10-CM | POA: Diagnosis not present

## 2014-04-16 DIAGNOSIS — M5137 Other intervertebral disc degeneration, lumbosacral region: Secondary | ICD-10-CM | POA: Diagnosis not present

## 2014-04-16 DIAGNOSIS — I509 Heart failure, unspecified: Secondary | ICD-10-CM | POA: Diagnosis not present

## 2014-04-16 DIAGNOSIS — R0602 Shortness of breath: Secondary | ICD-10-CM | POA: Diagnosis not present

## 2014-04-16 DIAGNOSIS — G2581 Restless legs syndrome: Secondary | ICD-10-CM | POA: Diagnosis not present

## 2014-04-16 DIAGNOSIS — G894 Chronic pain syndrome: Secondary | ICD-10-CM | POA: Diagnosis not present

## 2014-04-16 DIAGNOSIS — M79606 Pain in leg, unspecified: Secondary | ICD-10-CM | POA: Diagnosis not present

## 2014-04-16 DIAGNOSIS — I5023 Acute on chronic systolic (congestive) heart failure: Secondary | ICD-10-CM | POA: Diagnosis not present

## 2014-04-16 DIAGNOSIS — M47817 Spondylosis without myelopathy or radiculopathy, lumbosacral region: Secondary | ICD-10-CM | POA: Diagnosis not present

## 2014-04-16 DIAGNOSIS — Z79899 Other long term (current) drug therapy: Secondary | ICD-10-CM | POA: Diagnosis not present

## 2014-04-16 DIAGNOSIS — M545 Low back pain: Secondary | ICD-10-CM | POA: Diagnosis not present

## 2014-04-21 ENCOUNTER — Ambulatory Visit (HOSPITAL_COMMUNITY)
Admission: RE | Admit: 2014-04-21 | Discharge: 2014-04-21 | Disposition: A | Discharge status: Home / Self Care | Payer: Medicare Other | Source: Ambulatory Visit | Attending: Cardiology | Admitting: Cardiology

## 2014-04-21 ENCOUNTER — Encounter (HOSPITAL_COMMUNITY): Payer: Self-pay

## 2014-04-21 VITALS — BP 96/70 | HR 75 | Wt 240.0 lb

## 2014-04-21 DIAGNOSIS — G8929 Other chronic pain: Secondary | ICD-10-CM | POA: Diagnosis not present

## 2014-04-21 DIAGNOSIS — I4891 Unspecified atrial fibrillation: Secondary | ICD-10-CM | POA: Diagnosis not present

## 2014-04-21 DIAGNOSIS — Z950 Presence of cardiac pacemaker: Secondary | ICD-10-CM | POA: Diagnosis not present

## 2014-04-21 DIAGNOSIS — I472 Ventricular tachycardia, unspecified: Secondary | ICD-10-CM

## 2014-04-21 DIAGNOSIS — E780 Pure hypercholesterolemia: Secondary | ICD-10-CM | POA: Diagnosis not present

## 2014-04-21 DIAGNOSIS — I129 Hypertensive chronic kidney disease with stage 1 through stage 4 chronic kidney disease, or unspecified chronic kidney disease: Secondary | ICD-10-CM | POA: Insufficient documentation

## 2014-04-21 DIAGNOSIS — I498 Other specified cardiac arrhythmias: Secondary | ICD-10-CM | POA: Diagnosis not present

## 2014-04-21 DIAGNOSIS — I429 Cardiomyopathy, unspecified: Secondary | ICD-10-CM | POA: Diagnosis not present

## 2014-04-21 DIAGNOSIS — I252 Old myocardial infarction: Secondary | ICD-10-CM | POA: Diagnosis not present

## 2014-04-21 DIAGNOSIS — D869 Sarcoidosis, unspecified: Secondary | ICD-10-CM | POA: Diagnosis not present

## 2014-04-21 DIAGNOSIS — M109 Gout, unspecified: Secondary | ICD-10-CM | POA: Insufficient documentation

## 2014-04-21 DIAGNOSIS — I499 Cardiac arrhythmia, unspecified: Secondary | ICD-10-CM | POA: Diagnosis not present

## 2014-04-21 DIAGNOSIS — K219 Gastro-esophageal reflux disease without esophagitis: Secondary | ICD-10-CM | POA: Diagnosis not present

## 2014-04-21 DIAGNOSIS — I5021 Acute systolic (congestive) heart failure: Secondary | ICD-10-CM

## 2014-04-21 DIAGNOSIS — I5022 Chronic systolic (congestive) heart failure: Secondary | ICD-10-CM | POA: Diagnosis not present

## 2014-04-21 DIAGNOSIS — Z79899 Other long term (current) drug therapy: Secondary | ICD-10-CM | POA: Insufficient documentation

## 2014-04-21 DIAGNOSIS — N184 Chronic kidney disease, stage 4 (severe): Secondary | ICD-10-CM

## 2014-04-21 DIAGNOSIS — I2510 Atherosclerotic heart disease of native coronary artery without angina pectoris: Secondary | ICD-10-CM | POA: Insufficient documentation

## 2014-04-21 DIAGNOSIS — Z7901 Long term (current) use of anticoagulants: Secondary | ICD-10-CM | POA: Diagnosis not present

## 2014-04-21 LAB — BASIC METABOLIC PANEL
Anion gap: 9 (ref 5–15)
BUN: 46 mg/dL — ABNORMAL HIGH (ref 6–23)
CO2: 31 mmol/L (ref 19–32)
Calcium: 9.6 mg/dL (ref 8.4–10.5)
Chloride: 101 mmol/L (ref 96–112)
Creatinine, Ser: 2.02 mg/dL — ABNORMAL HIGH (ref 0.50–1.35)
GFR calc Af Amer: 38 mL/min — ABNORMAL LOW (ref >90)
GFR, EST NON AFRICAN AMERICAN: 33 mL/min — AB (ref >90)
GLUCOSE: 126 mg/dL — AB (ref 70–99)
POTASSIUM: 3.7 mmol/L (ref 3.5–5.1)
Sodium: 141 mmol/L (ref 135–145)

## 2014-04-21 NOTE — Progress Notes (Signed)
Patient ID: [name], male   DOB: 06/16/1950, 64 y.o.   MRN: [id number] PCP: Debbrah Alar   HPI: Mr. Hearne is a 64 y.o. 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 He also has history of ventricular tachycardia s/p St Jude ICD (single lead, no Corevue) by Dr. Lovena Le, CKD stage IV, and paroxysmal atrial arrhythmias on amiodarone as well as sarcoidosis and hypertension. Cath in 2008 by Dr. Terrence Dupont showed no CAD.  Seen at [facility] and not felt to be a transplant candidate due to renal failure and lack of family support.  Milrinone initiated in January 2014 for low output. 03/01/12 milrinone increased 0.375 mcg/kg/min.   04/26/12 S/P Successful DC-CV for atrial fibrillation. Remains on amiodarone.   Admitted to [facility] 01/09/14-01/15/2014 with an infected PICC. Gram negative bacteremia with antibiotic course. Hospitalization was complicated by VT/VF. Discharge weight was 240 pounds.   He returns for HF follow up. He remains on Milrinone 0.375 mcg. Having a good day. Denies SOB/PND/Orthoppnea. Weight at home 237-240 pounds. Not taking diuretics. Appetite ok.  Taking all meds. Followed by [facility] for home milrinone.   ECHO 03/22/12 EF 15%  Echo 6/15: 20-25%  RHC 04/30/13 RA = 4  RV = 20/1/3  PA = 23/8 (15)  PCW = 5  Fick cardiac output/index =4.5/1.9  Thermo CO/CI = 3.9/1.6  PVR =  FA sat = 98%  PA sat = 62%, 64% - On milrinone 0.25  He is not on an ace inhibitor due to elevated creatinine. Not on nitrates due to Viagra use.   Labs:  08/15/12 Creatinine 4.24 Potassium 3.3 09/19/12 Creatinine 3.98, Potassium 3.1, BUN 78 10/07/12 Creatinine 2.62, Potassium 3.4 10/18/12 Creatinine 3.3, K+3.1 11/08/12 Creatinine 2.15 K 4.0 11/25/12 Creatinine 2.93, K+ 3.5 03/10/13: K+ 2.9, Creatinine 2.63 04/30/13: K+ 3.7, creatinine 2.4 6/15: K 3, creatinine 3.6 09/11/13: K 3.1 Cr 1.87  10/15: K 3.8, creatinine 2.13, HCT  40.7 11/06/13 K 4.0 Creatinine 1.58  12/17/13 K 3.6 Creatinine 2.3  3/16 K 3.9, creatinine 2.02 04/07/2014: K 3.4 Creatinine 2.37   ROS: All systems negative except as listed in HPI, PMH and Problem List.  Past Medical History  Diagnosis Date  . CHF (congestive heart failure)   . Sarcoidosis   . Cardiomyopathy, dilated, nonischemic     non ischemic by cath  . Acute on chronic systolic heart failure   . Automatic implantable cardiac defibrillator in situ   . Atrial fibrillation   . NSVT (nonsustained ventricular tachycardia)   . GERD (gastroesophageal reflux disease)   . Hypercholesteremia   . Shortness of breath   . Chronic kidney disease (CKD), stage III (moderate)   . Pacemaker   . Anginal pain   . Gout   . Hypertension     dr [name]  . Coronary artery disease   . Elevated PSA 06/24/2013  . Myocardial infarction   . Headache   . Neuropathy    Current Outpatient Prescriptions  Medication Sig Dispense Refill  . allopurinol (ZYLOPRIM) 100 MG tablet Take 1 tablet (100 mg total) by mouth daily. 30 tablet 6  . ALPRAZolam (XANAX) 0.5 MG tablet Take 1 tablet (0.5 mg total) by mouth 3 (three) times daily as needed for anxiety. 60 tablet 0  . amiodarone (PACERONE) 200 MG tablet Take 1 tablet (200 mg total) by mouth 2 (two) times daily. 60 tablet 6  . apixaban (ELIQUIS) 5 MG TABS tablet Take 1 tablet (5  mg total) by mouth 2 (two) times daily. 60 tablet 4  . carvedilol (COREG) 6.25 MG tablet Take 1 tablet (6.25 mg total) by mouth 2 (two) times daily with a meal. 60 tablet 3  . diphenhydrAMINE (BENADRYL) 50 MG capsule Take 50 mg by mouth daily as needed for itching.    Marland Kitchen eplerenone (INSPRA) 25 MG tablet Take 12.5 mg by mouth every other day.     . esomeprazole (NEXIUM) 40 MG capsule Take 40 mg by mouth 2 (two) times daily as needed (reflux).     . fluticasone (FLONASE) 50 MCG/ACT nasal spray Place 2 sprays into both nostrils daily as needed for allergies. 16 g 2  . Gabapentin  Enacarbil ER (HORIZANT) 300 MG TBCR Take 300 mg by mouth at bedtime as needed (pain).     . hydrOXYzine (ATARAX/VISTARIL) 25 MG tablet Take 1 tablet (25 mg total) by mouth 2 (two) times daily as needed. 60 tablet 2  . ketoconazole (NIZORAL) 2 % cream Apply 1 application topically 2 (two) times daily. X 2 weeks. 15 g 0  . magnesium oxide (MAG-OX) 400 MG tablet Take 400 mg by mouth 2 (two) times daily.    . metolazone (ZAROXOLYN) 2.5 MG tablet Take 1 tablet (2.5 mg total) by mouth daily as needed (for fluid retention). 10 tablet 6  . milrinone (PRIMACOR) 20 MG/100ML SOLN infusion Inject 0.375 mcg/kg/min into the vein continuous. 440mg  in 470ml infusion 2.5 ml per hour    . Oxycodone HCl 20 MG TABS Take 1 tablet (20 mg total) by mouth 3 (three) times daily as needed (pain.). (Patient taking differently: Take 20 mg by mouth 3 (three) times daily as needed (pain.). ) 15 tablet 0  . potassium chloride SA (K-DUR,KLOR-CON) 20 MEQ tablet Take 2 tablets (40 mEq total) by mouth daily. 60 tablet 6  . promethazine (PHENERGAN) 25 MG tablet Take 25 mg by mouth every 6 (six) hours as needed for nausea or vomiting.    . ranitidine (ZANTAC) 150 MG capsule Take 150 mg by mouth daily as needed for heartburn (reflux).    Marland Kitchen senna-docusate (SENOKOT-S) 8.6-50 MG per tablet Take 1-2 tablets by mouth 2 (two) times daily as needed for mild constipation or moderate constipation. 60 tablet 3  . sertraline (ZOLOFT) 100 MG tablet Take 1 tablet (100 mg total) by mouth daily. 30 tablet 3  . sildenafil (VIAGRA) 100 MG tablet Take 1 tablet (100 mg total) by mouth daily as needed for erectile dysfunction. 10 tablet 3  . sucralfate (CARAFATE) 1 GM/10ML suspension Take 10 mLs (1 g total) by mouth 4 (four) times daily. (Patient taking differently: Take 1 g by mouth 2 (two) times daily. ) 420 mL 1  . torsemide (DEMADEX) 20 MG tablet Take 3 tabs in the AM and 2 tabs in the PM    . zolpidem (AMBIEN) 5 MG tablet Take 1.5 tablets (7.5 mg total)  by mouth at bedtime. 45 tablet 0   No current facility-administered medications for this encounter.   Filed Vitals:   04/21/14 0828  BP: 96/70  Pulse: 75  Weight: 240 lb (108.863 kg)  SpO2: 96%    PHYSICAL EXAM: General: NAD; Chronically ill appearing.   HEENT: normal  Neck: supple. JVP flat. Carotids 2+ bilat; no bruits. No lymphadenopathy or thyromegaly appreciated.  Cor: PMI laterally displaced. Regular S1 S2 with no S3 and 2/6 HSM apex. R upper chest hickman site with milrinone infusing.  Lungs: clear Abdomen: soft, nontender, obese. No bruits  or masses. Good bowel sounds.  Mild abdominal distention.  Extremities: no cyanosis, clubbing, rash, no lower extremity edema  Neuro: alert & orientedx3, cranial nerves grossly intact. moves all 4 extremities w/o difficulty. Affect pleasant   ASSESSMENT & PLAN:   1) Chronic systolic HF: Nonischemic cardiomyopathy, EF 20-25% (06/2013). St Jude ICD (single chamber, no Corevue). He has been on home milrinone 0.375 mcg/kg/min since January 2014. NYHA class III symptoms. Doing well today.  - Continue Coreg, eplerenone, torsemide at current doses.  - No ACEI with CKD - Continue milrinone at 0.375 mcg.  Continue monthly BMET by [facility].  2) Atrial arrhythmias: Paroxysmal. He is in NSR today.  Continue amiodarone and apixaban. CBC today.   Should get yearly eye exams.  3) CKD, stage IV: BMET today.    4) Chronic pain: Per pain clinic.  5) VT/VF during hospitalization 01/10/14 . On amiodarone 200 mg twice a day.    Follow up in 6 weeks.  Madai Nuccio 04/21/2014

## 2014-04-21 NOTE — Patient Instructions (Signed)
Lab today  Your physician recommends that you schedule a follow-up appointment in: 6 weeks  

## 2014-04-23 DIAGNOSIS — R0602 Shortness of breath: Secondary | ICD-10-CM | POA: Diagnosis not present

## 2014-04-23 DIAGNOSIS — I5023 Acute on chronic systolic (congestive) heart failure: Secondary | ICD-10-CM | POA: Diagnosis not present

## 2014-04-23 DIAGNOSIS — I509 Heart failure, unspecified: Secondary | ICD-10-CM | POA: Diagnosis not present

## 2014-04-30 DIAGNOSIS — I509 Heart failure, unspecified: Secondary | ICD-10-CM | POA: Diagnosis not present

## 2014-04-30 DIAGNOSIS — R0602 Shortness of breath: Secondary | ICD-10-CM | POA: Diagnosis not present

## 2014-04-30 DIAGNOSIS — I5023 Acute on chronic systolic (congestive) heart failure: Secondary | ICD-10-CM | POA: Diagnosis not present

## 2014-05-03 IMAGING — US US ABDOMEN LIMITED
1 series · 8 of 8 positions shown · non-contrast
Comparison: Previous ultrasound

CLINICAL DATA: The history of heart failure, abdominal distention.
Evaluate for ascites

EXAM:
US ABDOMEN LIMITED - RIGHT UPPER QUADRANT

[Series 1: us abdomen limited · 0.37mm/px · 8 of 8 slices shown]
[im 1/8]
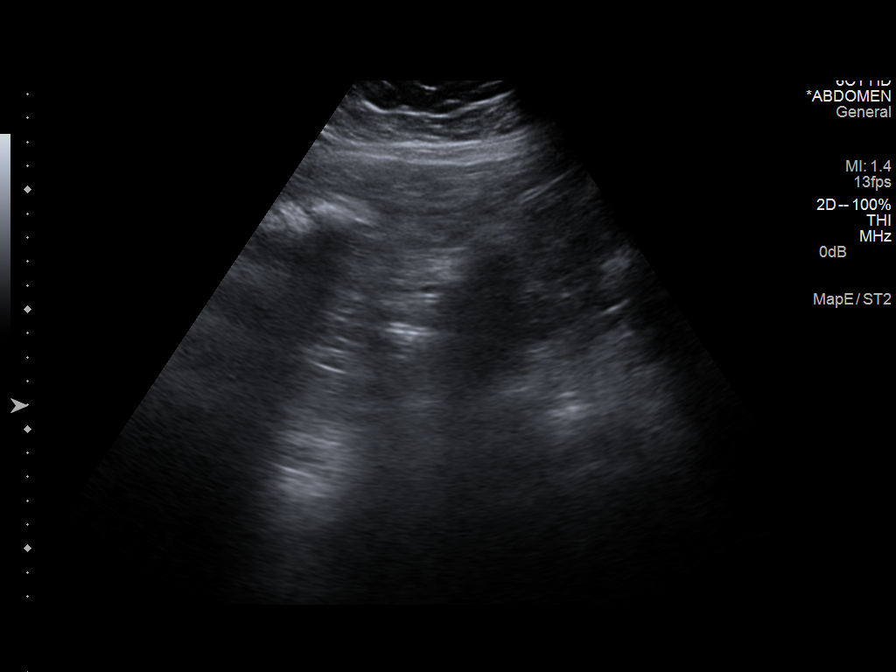
[im 2/8]
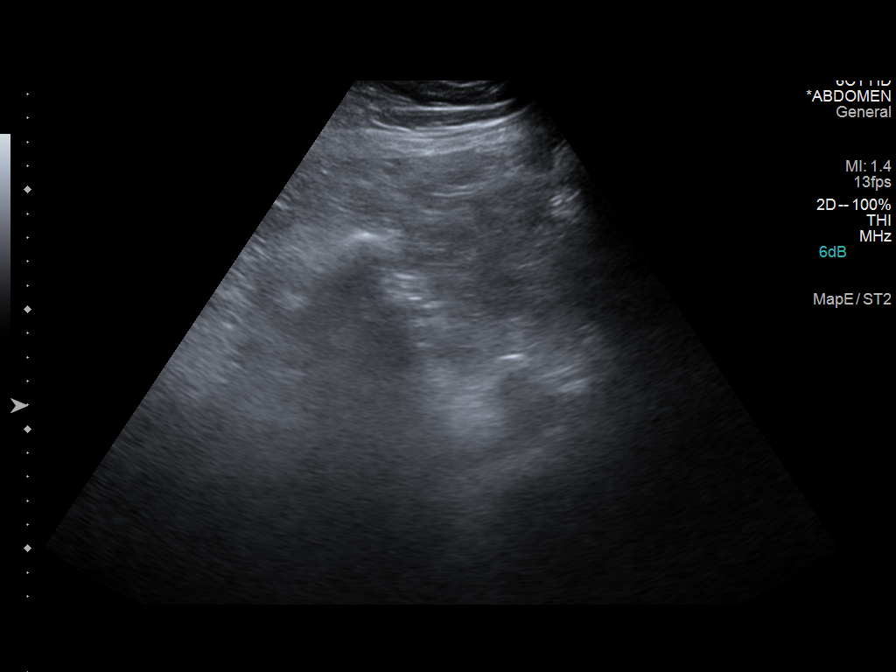
[im 3/8]
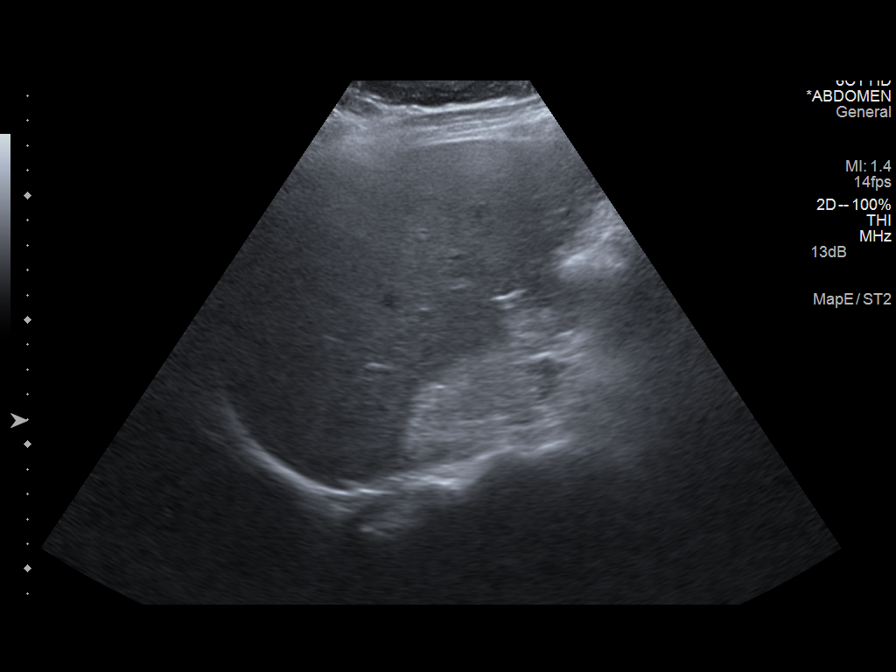
[im 4/8]
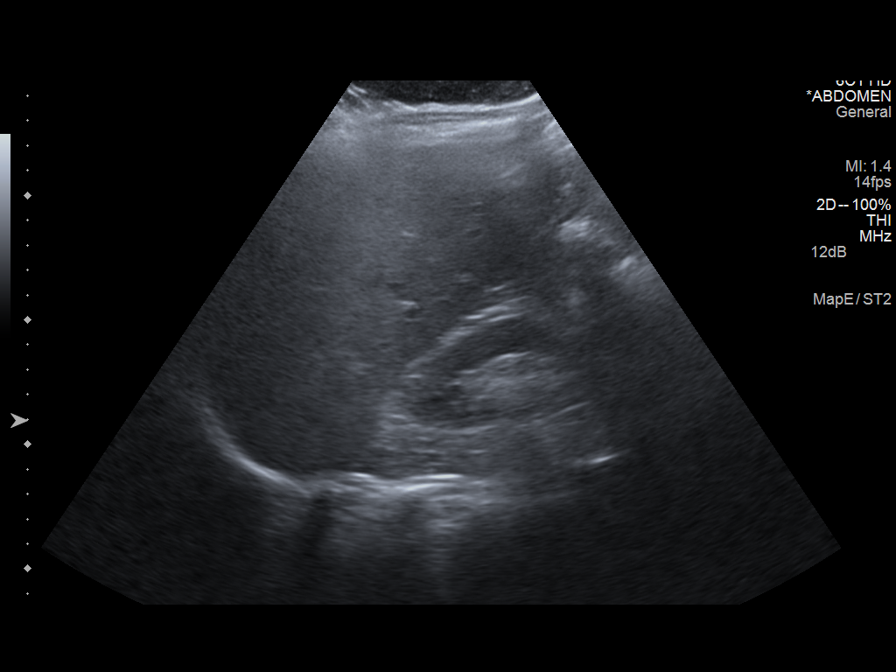
[im 5/8]
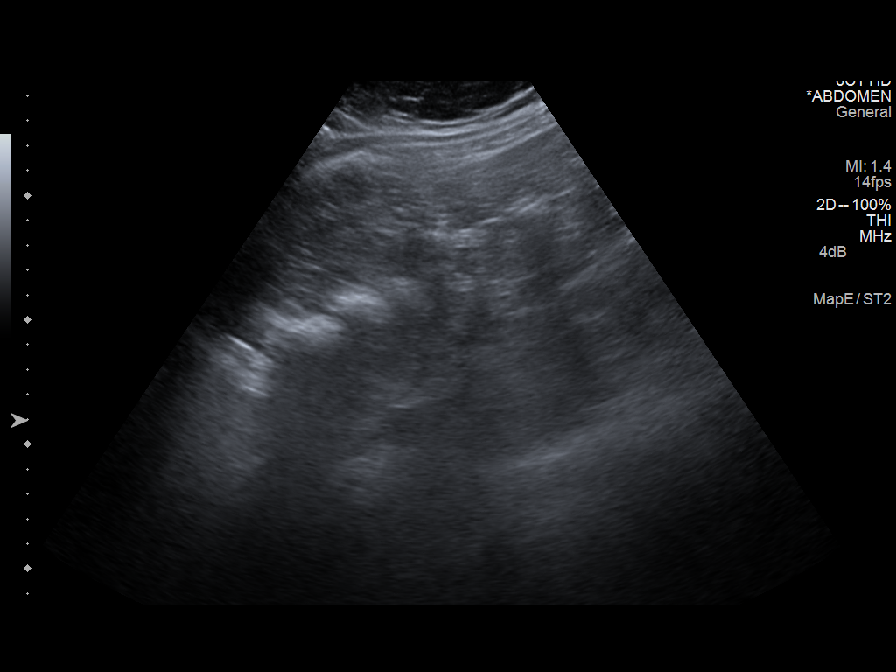
[im 6/8]
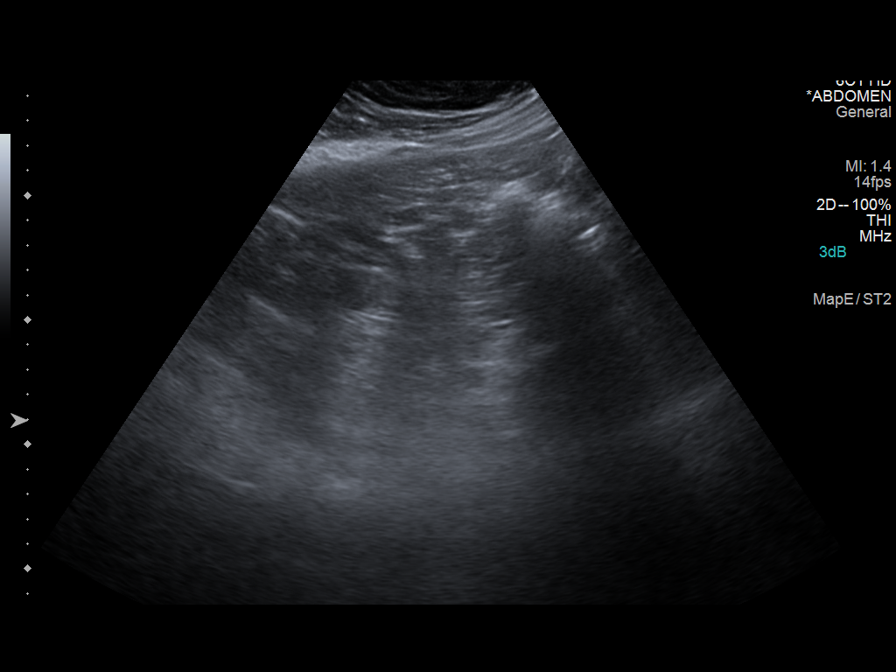
[im 7/8]
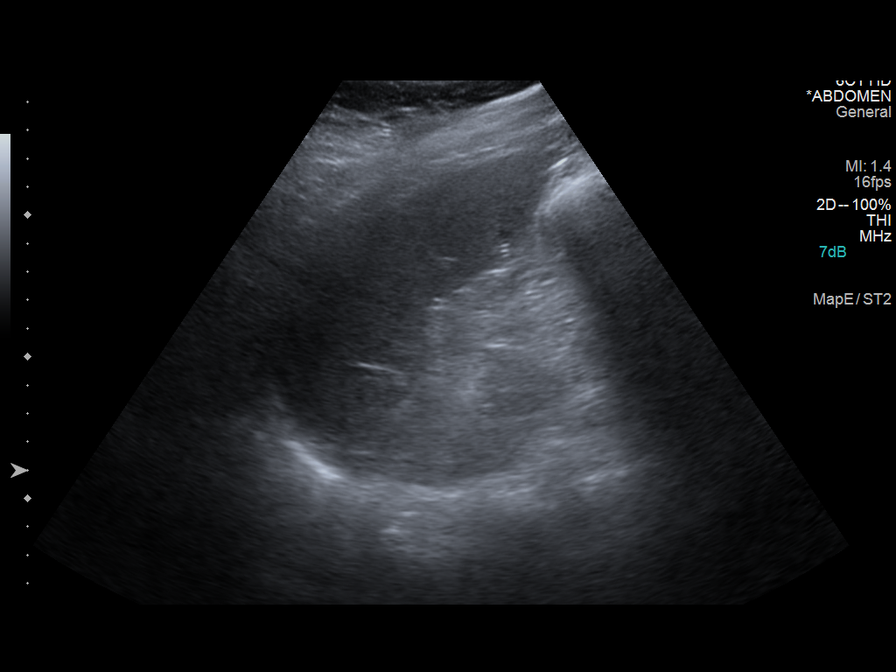
[im 8/8]
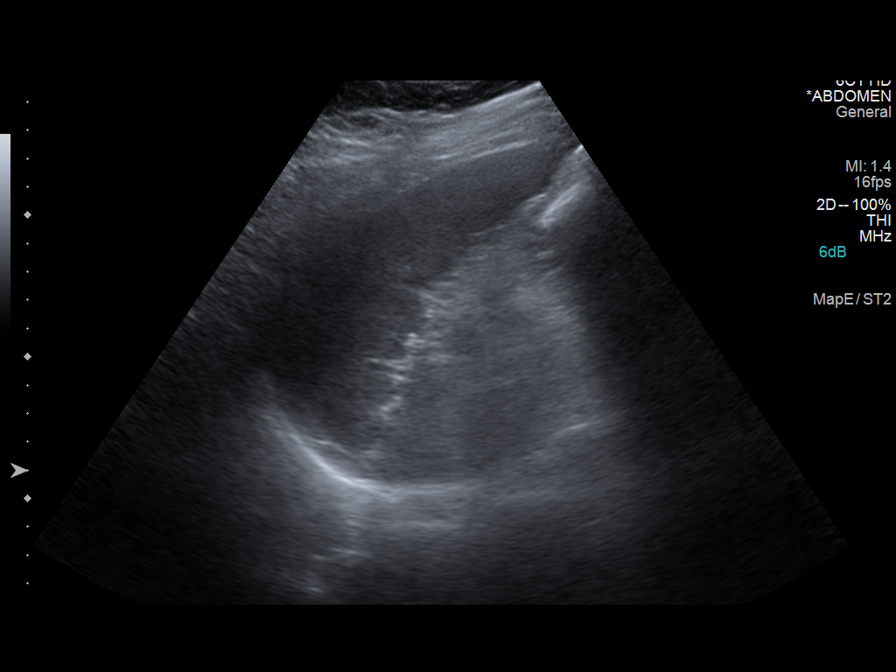

[8 of 8 positions shown; findings below may reference images not displayed]

FINDINGS: Limited ultrasound of the abdomen reveals no significant ascites.
Paracentesis is not amenable
IMPRESSION: No identifiable ascites

## 2014-05-07 ENCOUNTER — Other Ambulatory Visit: Payer: Self-pay | Admitting: Physician Assistant

## 2014-05-07 ENCOUNTER — Telehealth (HOSPITAL_COMMUNITY): Payer: Self-pay | Admitting: *Deleted

## 2014-05-07 DIAGNOSIS — I5022 Chronic systolic (congestive) heart failure: Secondary | ICD-10-CM | POA: Diagnosis not present

## 2014-05-07 DIAGNOSIS — I5023 Acute on chronic systolic (congestive) heart failure: Secondary | ICD-10-CM | POA: Diagnosis not present

## 2014-05-07 DIAGNOSIS — I509 Heart failure, unspecified: Secondary | ICD-10-CM | POA: Diagnosis not present

## 2014-05-07 DIAGNOSIS — R0602 Shortness of breath: Secondary | ICD-10-CM | POA: Diagnosis not present

## 2014-05-07 NOTE — Telephone Encounter (Signed)
Nurse with [facility] called pt is on Milrinone and is experiencing irregular heart beats and dizziness.  She stated his heart skips beats the races. Swelling in abdomen and ankles has gone down so the pharmacist decreased Milrinone slightly.

## 2014-05-07 NOTE — Telephone Encounter (Signed)
Will defer further refills of Ambien to PCP.

## 2014-05-08 ENCOUNTER — Ambulatory Visit: Payer: Medicare Other | Admitting: Family

## 2014-05-08 DIAGNOSIS — M5137 Other intervertebral disc degeneration, lumbosacral region: Secondary | ICD-10-CM | POA: Diagnosis not present

## 2014-05-08 DIAGNOSIS — Z79899 Other long term (current) drug therapy: Secondary | ICD-10-CM | POA: Diagnosis not present

## 2014-05-08 DIAGNOSIS — M79606 Pain in leg, unspecified: Secondary | ICD-10-CM | POA: Diagnosis not present

## 2014-05-08 DIAGNOSIS — M47817 Spondylosis without myelopathy or radiculopathy, lumbosacral region: Secondary | ICD-10-CM | POA: Diagnosis not present

## 2014-05-08 DIAGNOSIS — M545 Low back pain: Secondary | ICD-10-CM | POA: Diagnosis not present

## 2014-05-08 DIAGNOSIS — G894 Chronic pain syndrome: Secondary | ICD-10-CM | POA: Diagnosis not present

## 2014-05-08 NOTE — Telephone Encounter (Signed)
Rx printed and forwarded to Provider for signature. [facility]SC on file.  Medication name:  Name from pharmacy:  zolpidem (AMBIEN) 5 MG tablet ZOLPIDEM TARTRATE 5 MG TABLET     Sig: TAKE 1.5 TABLETS BY MOUTH EVERY DAY AT BEDTIME    Dispense: 45 tablet   Refills: 0   Start: 05/07/2014   Class: Normal    Notes to pharmacy: Not to exceed 5 additional fills before 09/21/2014    Requested on: 03/25/2014    Originally ordered on: 06/09/2013 03/25/2014

## 2014-05-08 NOTE — Telephone Encounter (Signed)
Rx faxed to pharmacy  

## 2014-05-09 IMAGING — CR DG CHEST 1V PORT
1 series · 1 of 1 positions shown · non-contrast
Comparison: 11/16/2012

CLINICAL DATA: Chest pain. Shortness of breath. History of previous
heart attacks.

EXAM:
PORTABLE CHEST - 1 VIEW

[AP]
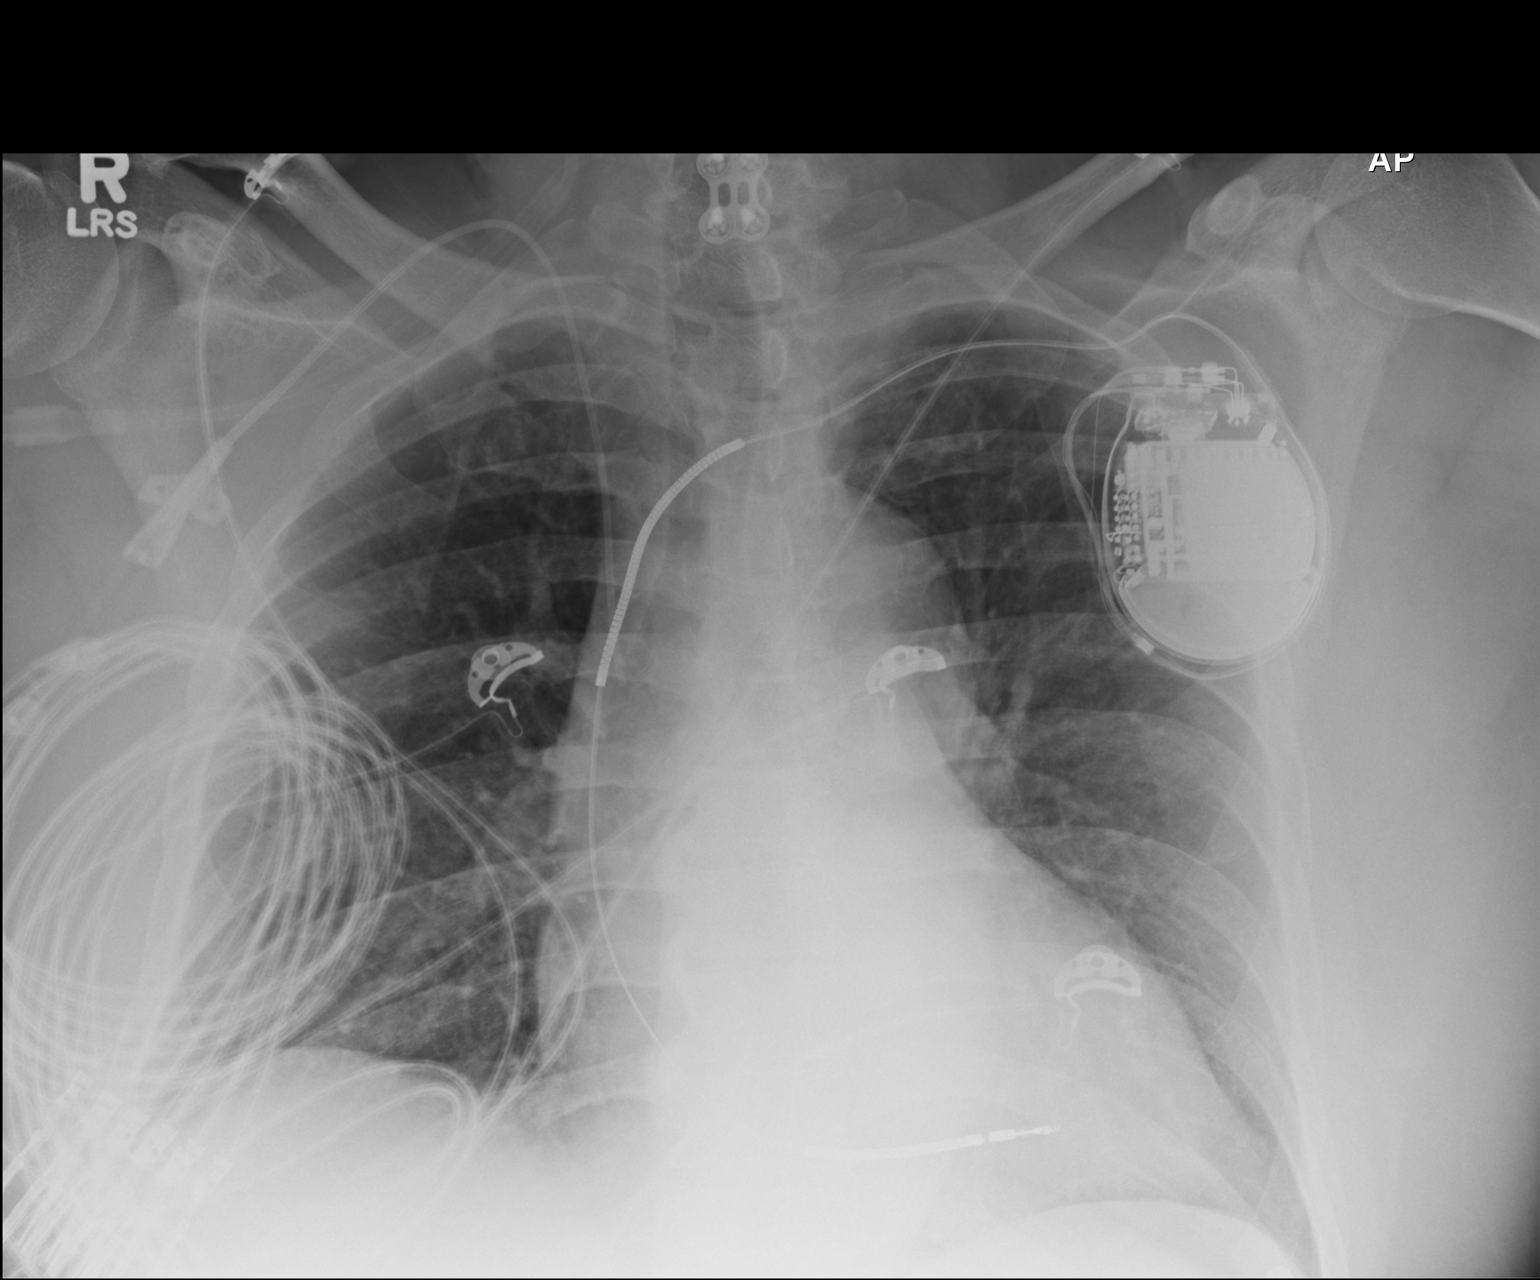

[1 of 1 positions shown; findings below may reference images not displayed]

FINDINGS: Stable appearance of cardiac pacemaker, right central venous
catheter, and postoperative changes in the cervical spine.
Superimposed wires obscure visualization of are of the right chest.
The heart size and pulmonary vascularity appear normal for
technique. No focal airspace consolidation in the lungs. No pleural
effusion or pneumothorax. No significant change since previous
study.
IMPRESSION: No active disease.

## 2014-05-12 ENCOUNTER — Encounter: Payer: Self-pay | Admitting: Internal Medicine

## 2014-05-13 ENCOUNTER — Ambulatory Visit (INDEPENDENT_AMBULATORY_CARE_PROVIDER_SITE_OTHER): Payer: Medicare Other | Admitting: Family

## 2014-05-13 ENCOUNTER — Encounter: Payer: Self-pay | Admitting: Family

## 2014-05-13 VITALS — BP 122/90 | HR 73 | Temp 97.9°F | Resp 16 | Ht 75.0 in | Wt 238.4 lb

## 2014-05-13 DIAGNOSIS — R1013 Epigastric pain: Secondary | ICD-10-CM | POA: Diagnosis not present

## 2014-05-13 DIAGNOSIS — F418 Other specified anxiety disorders: Secondary | ICD-10-CM

## 2014-05-13 DIAGNOSIS — R5383 Other fatigue: Secondary | ICD-10-CM

## 2014-05-13 LAB — HEPATIC FUNCTION PANEL
ALK PHOS: 87 U/L (ref 39–117)
ALT: 40 U/L (ref 0–53)
AST: 45 U/L — ABNORMAL HIGH (ref 0–37)
Albumin: 3.2 g/dL — ABNORMAL LOW (ref 3.5–5.2)
BILIRUBIN DIRECT: 0.1 mg/dL (ref 0.0–0.3)
TOTAL PROTEIN: 6.8 g/dL (ref 6.0–8.3)
Total Bilirubin: 0.5 mg/dL (ref 0.2–1.2)

## 2014-05-13 LAB — T3, FREE: T3 FREE: 3.2 pg/mL (ref 2.3–4.2)

## 2014-05-13 LAB — T4, FREE: FREE T4: 0.74 ng/dL (ref 0.60–1.60)

## 2014-05-13 LAB — LIPASE: Lipase: 48 U/L (ref 11.0–59.0)

## 2014-05-13 LAB — TSH: TSH: 1.93 u[IU]/mL (ref 0.35–4.50)

## 2014-05-13 MED ORDER — SERTRALINE HCL 100 MG PO TABS: 100.0000 mg | ORAL_TABLET | Freq: Every day | ORAL | Status: AC

## 2014-05-13 NOTE — Assessment & Plan Note (Signed)
Improved on zoloft 100mg . Continue same.

## 2014-05-13 NOTE — Progress Notes (Signed)
Pre visit review using our clinic review tool, if applicable. No additional management support is needed unless otherwise documented below in the visit note. 

## 2014-05-13 NOTE — Progress Notes (Signed)
Subjective:    Patient ID: [name], male    DOB: [birth year], 64 y.o.   MRN: [id number]  HPI  [name] is a 64 yr old male who presents today for follow up.  1) Depression/anxiety- Last visit we recommended that he establish with a therapist.  His Zoloft was increased from 50mg  to100mg . He was continued on his PRN xanax. Pt reports feeling much better, feels calmer, better mood, more positive, less tearful. Denies associated side effects.   2) Fatigue- Reports increased fatigue. Reports that has felt fatigued x approximately 6 months.   He had a TSH drawn 1 month ago by cardiology which noted elevated TSH.   CBC drawn last month was normal. Wt Readings from Last 3 Encounters:  05/13/14 238 lb 6.4 oz (108.138 kg)  04/08/14 244 lb 3.2 oz (110.768 kg)  03/26/14 242 lb (109.77 kg)   3) Abdominal pain- Pt has hx or chronic epigastric pain.  We addressed this complaint last year.  At that time we completed an abdominal US, added PPI and carafate.  US revealed mild gallbladder sludge. This past September he was referred to GI.  He was scheduled for EGD/Colo last September, but this was cancelled because he became hypotensive after his bowel prep.  Apparently, his symptoms improved with H2 blocker and prn Gas-x, but then worsened after he began taking 10-12 aspirin a day for dental pain.    Reports that abdominal pain comes and goes and is epigastric. Denies nausea. Reports that his dental issues have resolved.  No longer taking ASA containing products for his dental pain.   Review of Systems    see HPI  Past Medical History  Diagnosis Date  . CHF (congestive heart failure)   . Sarcoidosis   . Cardiomyopathy, dilated, nonischemic     non ischemic by cath  . Acute on chronic systolic heart failure   . Automatic implantable cardiac defibrillator in situ   . Atrial fibrillation   . NSVT (nonsustained ventricular tachycardia)   . GERD (gastroesophageal reflux disease)   .  Hypercholesteremia   . Shortness of breath   . Chronic kidney disease (CKD), stage III (moderate)   . Pacemaker   . Anginal pain   . Gout   . Hypertension     dr [name]  . Coronary artery disease   . Elevated PSA 06/24/2013  . Myocardial infarction   . Headache   . Neuropathy     History   Social History  . Marital Status: Single    Spouse Name: N/A  . Number of Children: N/A  . Years of Education: N/A   Occupational History  . Not on file.   Social History Main Topics  . Smoking status: Never Smoker   . Smokeless tobacco: Never Used  . Alcohol Use: No  . Drug Use: Yes    Special: "Crack" cocaine, Cocaine     Comment: last use august 2004  . Sexual Activity: Not Currently   Other Topics Concern  . Not on file   Social History Narrative   3 sons- one in [place], 1 in [place], on in [place]   Retired from post office- on disability   Completed Bachelors from [employer]    Past Surgical History  Procedure Laterality Date  . Pacemaker insertion  2009    with ICD  . Tee without cardioversion  01/17/2011    Procedure: TRANSESOPHAGEAL ECHOCARDIOGRAM (TEE);  Surgeon: Birdie Riddle, MD;  Location: [facility]  ENDOSCOPY;  Service: Cardiovascular;  Laterality: N/A;  . Cardioversion  01/17/2011    Procedure: CARDIOVERSION;  Surgeon: Birdie Riddle, MD;  Location: [place];  Service: Cardiovascular;  Laterality: N/A;  . Cardiac catheterization    . Insert / replace / remove pacemaker    . Anterior cervical decomp/discectomy fusion  08/21/2011    Procedure: ANTERIOR CERVICAL DECOMPRESSION/DISCECTOMY FUSION 2 LEVELS;  Surgeon: Marybelle Killings, MD;  Location: [place];  Service: Orthopedics;  Laterality: N/A;  C5-6, C6-7 Anterior Cervical Discectomy and Fusion, allograft, plate  . Tee without cardioversion N/A 02/16/2012    Procedure: TRANSESOPHAGEAL ECHOCARDIOGRAM (TEE);  Surgeon: Jolaine Artist, MD;  Location: [facility] ENDOSCOPY;  Service: Cardiovascular;  Laterality: N/A;  original case scheduled under  his dad (who is deceased), rescheduled under correct mrn/pt/dob. [facility]/dl  . Tee without cardioversion N/A 03/22/2012    Procedure: TRANSESOPHAGEAL ECHOCARDIOGRAM (TEE);  Surgeon: Lelon Perla, MD;  Location: [place];  Service: Cardiovascular;  Laterality: N/A;  . Cardioversion N/A 03/22/2012    Procedure: CARDIOVERSION;  Surgeon: Lelon Perla, MD;  Location: [facility] ENDOSCOPY;  Service: Cardiovascular;  Laterality: N/A;  . Cardioversion N/A 04/26/2012    Procedure: CARDIOVERSION;  Surgeon: Jolaine Artist, MD;  Location: [facility] ENDOSCOPY;  Service: Cardiovascular;  Laterality: N/A;  . Central venous catheter tunneled insertion single lumen  09/16/2012    right IJ  . Cardioversion N/A 11/08/2012    Procedure: CARDIOVERSION;  Surgeon: Jolaine Artist, MD;  Location: [facility] ENDOSCOPY;  Service: Cardiovascular;  Laterality: N/A;  . Back surgery  1987, lower    Ruptured disk repair  . Peripherally inserted central catheter insertion  2013  . Right heart catheterization N/A 01/26/2012    Procedure: RIGHT HEART CATH;  Surgeon: Jolaine Artist, MD;  Location: [facility] CATH LAB;  Service: Cardiovascular;  Laterality: N/A;  . Right heart catheterization N/A 04/30/2013    Procedure: RIGHT HEART CATH;  Surgeon: Jolaine Artist, MD;  Location: [facility] CATH LAB;  Service: Cardiovascular;  Laterality: N/A;    Family History  Problem Relation Age of Onset  . Heart disease    . Heart failure    . Stroke    . Anesthesia problems Neg Hx   . Hypotension Neg Hx   . Malignant hyperthermia Neg Hx   . Pseudochol deficiency Neg Hx     Allergies  Allergen Reactions  . Nitroglycerin Other (See Comments)    "feels like head is going to bust open"  . Colchicine Nausea Only    Current Outpatient Prescriptions on File Prior to Visit  Medication Sig Dispense Refill  . allopurinol (ZYLOPRIM) 100 MG tablet Take 1 tablet (100 mg total) by mouth daily. 30 tablet 6  . ALPRAZolam (XANAX) 0.5 MG tablet Take 1 tablet (0.5  mg total) by mouth 3 (three) times daily as needed for anxiety. 60 tablet 0  . amiodarone (PACERONE) 200 MG tablet Take 1 tablet (200 mg total) by mouth 2 (two) times daily. 60 tablet 6  . apixaban (ELIQUIS) 5 MG TABS tablet Take 1 tablet (5 mg total) by mouth 2 (two) times daily. 60 tablet 4  . carvedilol (COREG) 6.25 MG tablet Take 1 tablet (6.25 mg total) by mouth 2 (two) times daily with a meal. 60 tablet 3  . diphenhydrAMINE (BENADRYL) 50 MG capsule Take 50 mg by mouth daily as needed for itching.    Marland Kitchen eplerenone (INSPRA) 25 MG tablet Take 12.5 mg by mouth every other day.     Marland Kitchen  fluticasone (FLONASE) 50 MCG/ACT nasal spray Place 2 sprays into both nostrils daily as needed for allergies. 16 g 2  . Gabapentin Enacarbil ER (HORIZANT) 300 MG TBCR Take 300 mg by mouth at bedtime as needed (pain).     . hydrOXYzine (ATARAX/VISTARIL) 25 MG tablet Take 1 tablet (25 mg total) by mouth 2 (two) times daily as needed. 60 tablet 2  . ketoconazole (NIZORAL) 2 % cream Apply 1 application topically 2 (two) times daily. X 2 weeks. 15 g 0  . magnesium oxide (MAG-OX) 400 MG tablet Take 400 mg by mouth 2 (two) times daily.    . metolazone (ZAROXOLYN) 2.5 MG tablet Take 1 tablet (2.5 mg total) by mouth daily as needed (for fluid retention). 10 tablet 6  . milrinone (PRIMACOR) 20 MG/100ML SOLN infusion Inject 0.375 mcg/kg/min into the vein continuous. 440mg  in 458ml infusion 2.4 ml per hour    . Oxycodone HCl 20 MG TABS Take 1 tablet (20 mg total) by mouth 3 (three) times daily as needed (pain.). (Patient taking differently: Take 20 mg by mouth 3 (three) times daily as needed (pain.). ) 15 tablet 0  . potassium chloride SA (K-DUR,KLOR-CON) 20 MEQ tablet Take 2 tablets (40 mEq total) by mouth daily. 60 tablet 6  . promethazine (PHENERGAN) 25 MG tablet Take 25 mg by mouth every 6 (six) hours as needed for nausea or vomiting.    . ranitidine (ZANTAC) 150 MG capsule Take 150 mg by mouth daily as needed for heartburn  (reflux).    Marland Kitchen senna-docusate (SENOKOT-S) 8.6-50 MG per tablet Take 1-2 tablets by mouth 2 (two) times daily as needed for mild constipation or moderate constipation. 60 tablet 3  . sertraline (ZOLOFT) 100 MG tablet Take 1 tablet (100 mg total) by mouth daily. 30 tablet 3  . sildenafil (VIAGRA) 100 MG tablet Take 1 tablet (100 mg total) by mouth daily as needed for erectile dysfunction. 10 tablet 3  . sucralfate (CARAFATE) 1 GM/10ML suspension Take 10 mLs (1 g total) by mouth 4 (four) times daily. (Patient taking differently: Take 1 g by mouth 2 (two) times daily. ) 420 mL 1  . torsemide (DEMADEX) 20 MG tablet Take 3 tabs in the AM and 2 tabs in the PM    . zolpidem (AMBIEN) 5 MG tablet TAKE 1.5 TABLETS BY MOUTH EVERY DAY AT BEDTIME 45 tablet 0   No current facility-administered medications on file prior to visit.    BP 122/90 mmHg  Pulse 73  Temp(Src) 97.9 F (36.6 C) (Oral)  Resp 16  Ht 6\' 3"  (1.905 m)  Wt 238 lb 6.4 oz (108.138 kg)  BMI 29.80 kg/m2  SpO2 99%    Objective:   Physical Exam  Constitutional: He is oriented to person, place, and time. He appears well-developed and well-nourished. No distress.  Cardiovascular: Normal rate and regular rhythm.   No murmur heard. Pulmonary/Chest: Effort normal and breath sounds normal. No respiratory distress. He has no wheezes. He has no rales. He exhibits no tenderness.  Abdominal: Soft. He exhibits no distension. There is tenderness in the right upper quadrant, epigastric area and left upper quadrant. There is no rigidity and no guarding.  Musculoskeletal: He exhibits no edema.  Neurological: He is alert and oriented to person, place, and time.  Skin: Skin is warm and dry.  Psychiatric: He has a normal mood and affect. His behavior is normal. Judgment and thought content normal.          Assessment &  Plan:

## 2014-05-13 NOTE — Assessment & Plan Note (Signed)
Unchanged, suspect multifactorial, hypothyroid likely contributing.  Obtain follow up TFT's- will likely initiate synthroid pending results.

## 2014-05-13 NOTE — Patient Instructions (Addendum)
Please complete lab work prior to leaving. (blood work and UDS) Marketing executive.   Schedule follow up with Dr. Ardis Hughs. Follow up in 3 months.

## 2014-05-13 NOTE — Assessment & Plan Note (Addendum)
Unchanged. Will obtain follow up LFT's, Lipase, advise pt to schedule follow up with GI.

## 2014-05-14 ENCOUNTER — Encounter: Payer: Self-pay | Admitting: Family

## 2014-05-14 DIAGNOSIS — I509 Heart failure, unspecified: Secondary | ICD-10-CM | POA: Diagnosis not present

## 2014-05-14 DIAGNOSIS — I5023 Acute on chronic systolic (congestive) heart failure: Secondary | ICD-10-CM | POA: Diagnosis not present

## 2014-05-14 DIAGNOSIS — R0602 Shortness of breath: Secondary | ICD-10-CM | POA: Diagnosis not present

## 2014-05-19 ENCOUNTER — Ambulatory Visit (HOSPITAL_COMMUNITY)
Admission: RE | Admit: 2014-05-19 | Discharge: 2014-05-19 | Disposition: A | Discharge status: Home / Self Care | Payer: Medicare Other | Source: Ambulatory Visit | Attending: Cardiology | Admitting: Cardiology

## 2014-05-19 VITALS — BP 116/60 | HR 41 | Wt 232.0 lb

## 2014-05-19 DIAGNOSIS — I5022 Chronic systolic (congestive) heart failure: Secondary | ICD-10-CM | POA: Diagnosis not present

## 2014-05-19 DIAGNOSIS — I472 Ventricular tachycardia, unspecified: Secondary | ICD-10-CM

## 2014-05-19 DIAGNOSIS — N184 Chronic kidney disease, stage 4 (severe): Secondary | ICD-10-CM

## 2014-05-19 DIAGNOSIS — I498 Other specified cardiac arrhythmias: Secondary | ICD-10-CM

## 2014-05-19 DIAGNOSIS — I499 Cardiac arrhythmia, unspecified: Secondary | ICD-10-CM | POA: Diagnosis not present

## 2014-05-19 LAB — BASIC METABOLIC PANEL
ANION GAP: 9 (ref 5–15)
BUN: 28 mg/dL — ABNORMAL HIGH (ref 6–20)
CALCIUM: 9.5 mg/dL (ref 8.9–10.3)
CO2: 22 mmol/L (ref 22–32)
Chloride: 109 mmol/L (ref 101–111)
Creatinine, Ser: 1.51 mg/dL — ABNORMAL HIGH (ref 0.61–1.24)
GFR, EST AFRICAN AMERICAN: 55 mL/min — AB (ref >60)
GFR, EST NON AFRICAN AMERICAN: 47 mL/min — AB (ref >60)
Glucose, Bld: 112 mg/dL — ABNORMAL HIGH (ref 65–99)
Potassium: 4 mmol/L (ref 3.5–5.1)
Sodium: 140 mmol/L (ref 135–145)

## 2014-05-19 LAB — BRAIN NATRIURETIC PEPTIDE: B Natriuretic Peptide: 537 pg/mL — ABNORMAL HIGH (ref 0.0–100.0)

## 2014-05-19 LAB — MAGNESIUM: Magnesium: 2.2 mg/dL (ref 1.7–2.4)

## 2014-05-19 NOTE — Patient Instructions (Signed)
Take Amiodarone 400 mg (2 tabs) Twice daily for 1 week THEN take 200 mg Twice daily   Labs today  You have been referred back to Dr Lovena Le  Your physician recommends that you schedule a follow-up appointment in: 2 weeks

## 2014-05-19 NOTE — Progress Notes (Signed)
Patient ID: [name], male   DOB: [birth year], 64 y.o.   MRN: [id number] PCP: Debbrah Alar   HPI: [name] is a 64 y.o. 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 He also has history of ventricular tachycardia s/p St Jude ICD (single lead, no Corevue) by Dr. Lovena Le, CKD stage IV, and paroxysmal atrial arrhythmias on amiodarone as well as sarcoidosis and hypertension. Cath in 2008 by Dr. Terrence Dupont showed no CAD.  Seen at [facility] and not felt to be a transplant candidate due to renal failure and lack of family support.  Milrinone initiated in January 2014 for low output. 03/01/12 milrinone increased 0.375 mcg/kg/min.   04/26/12 S/P Successful DC-CV for atrial fibrillation. Remains on amiodarone.   Admitted to [facility] 01/09/14-01/15/2014 with an infected PICC. Gram negative bacteremia with antibiotic course. Hospitalization was complicated by VT/VF. Discharge weight was 240 pounds.   He returns for HF follow up.  He remains on Milrinone 0.375 mcg/kg/min.  He has been taking all his meds.  This morning, he was lying in bed and felt warm all over then had ICD discharge.  He did not pass out.  St Jude interrogated his ICD today, this is his first episode since 1/16.  He says he has been stable recently, dyspnea only with moderate to heavy exertion (no change).  Weight is down 8 lbs.  He says he has felt "a little weak" recently.  No chest pain.  ECHO 03/22/12 EF 15%  Echo 6/15: 20-25%  RHC 04/30/13 RA = 4  RV = 20/1/3  PA = 23/8 (15)  PCW = 5  Fick cardiac output/index =4.5/1.9  Thermo CO/CI = 3.9/1.6  PVR =  FA sat = 98%  PA sat = 62%, 64% - On milrinone 0.25  He is not on an ace inhibitor due to elevated creatinine. Not on nitrates due to Viagra use.   Labs:  08/15/12 Creatinine 4.24 Potassium 3.3 09/19/12 Creatinine 3.98, Potassium 3.1, BUN 78 10/07/12 Creatinine 2.62, Potassium 3.4 10/18/12 Creatinine 3.3,  K+3.1 11/08/12 Creatinine 2.15 K 4.0 11/25/12 Creatinine 2.93, K+ 3.5 03/10/13: K+ 2.9, Creatinine 2.63 04/30/13: K+ 3.7, creatinine 2.4 6/15: K 3, creatinine 3.6 09/11/13: K 3.1 Cr 1.87  10/15: K 3.8, creatinine 2.13, HCT 40.7 11/06/13 K 4.0 Creatinine 1.58  12/17/13 K 3.6 Creatinine 2.3  3/16 K 3.9, creatinine 2.02 04/2014: K 3.4 => 3.7,  Creatinine 2.37 => 2.02, TSH/free T4/free T3 normal, AST 45, ALT 40  ECG: NSR with PVCs, LAFB, lateral TWIs  ROS: All systems negative except as listed in HPI, PMH and Problem List.  Past Medical History  Diagnosis Date  . CHF (congestive heart failure)   . Sarcoidosis   . Cardiomyopathy, dilated, nonischemic     non ischemic by cath  . Acute on chronic systolic heart failure   . Automatic implantable cardiac defibrillator in situ   . Atrial fibrillation   . NSVT (nonsustained ventricular tachycardia)   . GERD (gastroesophageal reflux disease)   . Hypercholesteremia   . Shortness of breath   . Chronic kidney disease (CKD), stage III (moderate)   . Pacemaker   . Anginal pain   . Gout   . Hypertension     dr [name]  . Coronary artery disease   . Elevated PSA 06/24/2013  . Myocardial infarction   . Headache   . Neuropathy    Current Outpatient Prescriptions  Medication Sig Dispense Refill  . allopurinol (  ZYLOPRIM) 100 MG tablet Take 1 tablet (100 mg total) by mouth daily. 30 tablet 6  . ALPRAZolam (XANAX) 0.5 MG tablet Take 1 tablet (0.5 mg total) by mouth 3 (three) times daily as needed for anxiety. 60 tablet 0  . amiodarone (PACERONE) 200 MG tablet Take 1 tablet (200 mg total) by mouth 2 (two) times daily. 60 tablet 6  . apixaban (ELIQUIS) 5 MG TABS tablet Take 1 tablet (5 mg total) by mouth 2 (two) times daily. 60 tablet 4  . carvedilol (COREG) 6.25 MG tablet Take 1 tablet (6.25 mg total) by mouth 2 (two) times daily with a meal. 60 tablet 3  . diphenhydrAMINE (BENADRYL) 50 MG capsule Take 50 mg by mouth daily as needed for itching.     Marland Kitchen eplerenone (INSPRA) 25 MG tablet Take 12.5 mg by mouth every other day.     . fluticasone (FLONASE) 50 MCG/ACT nasal spray Place 2 sprays into both nostrils daily as needed for allergies. 16 g 2  . Gabapentin Enacarbil ER (HORIZANT) 300 MG TBCR Take 300 mg by mouth at bedtime as needed (pain).     . hydrOXYzine (ATARAX/VISTARIL) 25 MG tablet Take 1 tablet (25 mg total) by mouth 2 (two) times daily as needed. 60 tablet 2  . ketoconazole (NIZORAL) 2 % cream Apply 1 application topically 2 (two) times daily. X 2 weeks. 15 g 0  . magnesium oxide (MAG-OX) 400 MG tablet Take 400 mg by mouth 2 (two) times daily.    . metolazone (ZAROXOLYN) 2.5 MG tablet Take 1 tablet (2.5 mg total) by mouth daily as needed (for fluid retention). 10 tablet 6  . milrinone (PRIMACOR) 20 MG/100ML SOLN infusion Inject 0.375 mcg/kg/min into the vein continuous. 440mg  in 474ml infusion 2.4 ml per hour    . Oxycodone HCl 20 MG TABS Take 1 tablet (20 mg total) by mouth 3 (three) times daily as needed (pain.). (Patient taking differently: Take 20 mg by mouth 3 (three) times daily as needed (pain.). ) 15 tablet 0  . potassium chloride SA (K-DUR,KLOR-CON) 20 MEQ tablet Take 2 tablets (40 mEq total) by mouth daily. 60 tablet 6  . promethazine (PHENERGAN) 25 MG tablet Take 25 mg by mouth every 6 (six) hours as needed for nausea or vomiting.    . ranitidine (ZANTAC) 150 MG capsule Take 150 mg by mouth daily as needed for heartburn (reflux).    Marland Kitchen senna-docusate (SENOKOT-S) 8.6-50 MG per tablet Take 1-2 tablets by mouth 2 (two) times daily as needed for mild constipation or moderate constipation. 60 tablet 3  . sertraline (ZOLOFT) 100 MG tablet Take 1 tablet (100 mg total) by mouth daily. 90 tablet 1  . sildenafil (VIAGRA) 100 MG tablet Take 1 tablet (100 mg total) by mouth daily as needed for erectile dysfunction. 10 tablet 3  . sucralfate (CARAFATE) 1 GM/10ML suspension Take 10 mLs (1 g total) by mouth 4 (four) times daily. (Patient  taking differently: Take 1 g by mouth 2 (two) times daily. ) 420 mL 1  . torsemide (DEMADEX) 20 MG tablet Take 3 tabs in the AM and 2 tabs in the PM    . zolpidem (AMBIEN) 5 MG tablet TAKE 1.5 TABLETS BY MOUTH EVERY DAY AT BEDTIME 45 tablet 0   No current facility-administered medications for this encounter.   Filed Vitals:   05/19/14 1552  BP: 116/60  Pulse: 60  Weight: 232 lb (105.235 kg)  SpO2: 97%    PHYSICAL EXAM: General: NAD; Chronically  ill appearing.   HEENT: normal  Neck: supple. JVP flat. Carotids 2+ bilat; no bruits. No lymphadenopathy or thyromegaly appreciated.  Cor: PMI laterally displaced. Regular S1 S2 with no S3 and 2/6 HSM LLSB. R upper chest hickman site with milrinone infusing.  Lungs: clear Abdomen: soft, nontender, obese. No bruits or masses. Good bowel sounds.  Mild abdominal distention.  Extremities: no cyanosis, clubbing, rash, no lower extremity edema  Neuro: alert & orientedx3, cranial nerves grossly intact. moves all 4 extremities w/o difficulty. Affect pleasant   ASSESSMENT & PLAN:   1) Chronic systolic HF: Nonischemic cardiomyopathy, EF 20-25% (06/2013). St Jude ICD (single chamber, no Corevue). He has been on home milrinone 0.375 mcg/kg/min since January 2014. NYHA class II-III symptoms. He does not look volume overloaded on exam today.   - Continue Coreg, eplerenone, torsemide at current doses.  - No ACEI with CKD - Continue milrinone at 0.375 mcg/kg/min.   - BMET/Mg/BNP now.   2) Atrial arrhythmias: Paroxysmal. He is in NSR today.  Continue amiodarone and apixaban. Recent thyroid tests and LFTs ok, should get yearly eye exams.  3) CKD, stage IV: BMET today.    4) VT/VF: During hospitalization 01/10/14.  He had another episode this morning.  He did not lose consciousness.   - Check K and Mg - Increase amiodarone to 400 mg bid x 1 week then back to 200 mg bid.  - Needs to see Dr Lovena Le as device is nearing ERI.  - If he has another discharge, needs to  go to ER.   - Followup 2 wks.   Loralie Champagne 05/19/2014

## 2014-05-21 DIAGNOSIS — R0602 Shortness of breath: Secondary | ICD-10-CM | POA: Diagnosis not present

## 2014-05-21 DIAGNOSIS — I5023 Acute on chronic systolic (congestive) heart failure: Secondary | ICD-10-CM | POA: Diagnosis not present

## 2014-05-21 DIAGNOSIS — I509 Heart failure, unspecified: Secondary | ICD-10-CM | POA: Diagnosis not present

## 2014-05-28 ENCOUNTER — Telehealth (HOSPITAL_COMMUNITY): Payer: Self-pay

## 2014-05-28 DIAGNOSIS — R0602 Shortness of breath: Secondary | ICD-10-CM | POA: Diagnosis not present

## 2014-05-28 DIAGNOSIS — I5023 Acute on chronic systolic (congestive) heart failure: Secondary | ICD-10-CM | POA: Diagnosis not present

## 2014-05-28 DIAGNOSIS — I509 Heart failure, unspecified: Secondary | ICD-10-CM | POA: Diagnosis not present

## 2014-05-28 NOTE — Telephone Encounter (Signed)
Recent lab work done in HF clinic reviewed by Dr. Aundra Dubin.  Per Dr. Aundra Dubin, BNP higher, volume overload may have played a role in VT. Would increase torsemide to 60 mg bid.  Info left on patient's VM, asked to call us back to let us know he received info.  Rx updated to preferred pharmacy electronically.  [name] Pain

## 2014-05-31 ENCOUNTER — Other Ambulatory Visit: Payer: Self-pay | Admitting: Family

## 2014-06-02 ENCOUNTER — Encounter (HOSPITAL_COMMUNITY): Payer: Self-pay

## 2014-06-02 ENCOUNTER — Ambulatory Visit (HOSPITAL_COMMUNITY)
Admission: RE | Admit: 2014-06-02 | Discharge: 2014-06-02 | Disposition: A | Discharge status: Home / Self Care | Payer: Medicare Other | Source: Ambulatory Visit | Attending: Cardiology | Admitting: Cardiology

## 2014-06-02 VITALS — BP 110/74 | HR 60 | Wt 237.8 lb

## 2014-06-02 DIAGNOSIS — I472 Ventricular tachycardia: Secondary | ICD-10-CM

## 2014-06-02 DIAGNOSIS — I5022 Chronic systolic (congestive) heart failure: Secondary | ICD-10-CM | POA: Insufficient documentation

## 2014-06-02 DIAGNOSIS — N184 Chronic kidney disease, stage 4 (severe): Secondary | ICD-10-CM | POA: Insufficient documentation

## 2014-06-02 DIAGNOSIS — I129 Hypertensive chronic kidney disease with stage 1 through stage 4 chronic kidney disease, or unspecified chronic kidney disease: Secondary | ICD-10-CM | POA: Diagnosis not present

## 2014-06-02 DIAGNOSIS — I429 Cardiomyopathy, unspecified: Secondary | ICD-10-CM | POA: Diagnosis not present

## 2014-06-02 DIAGNOSIS — I4729 Other ventricular tachycardia: Secondary | ICD-10-CM

## 2014-06-02 DIAGNOSIS — I480 Paroxysmal atrial fibrillation: Secondary | ICD-10-CM

## 2014-06-02 MED ORDER — TORSEMIDE 20 MG PO TABS: ORAL_TABLET | ORAL | Status: DC

## 2014-06-02 NOTE — Patient Instructions (Addendum)
Home Health Nurse will check your labs. (bmet and mag)  TAKE Amiodarone 200mg  twice a day.  INCREASE Torsemide to 60mg  (3 tablets) am and 40 mg (2 tablets) pm.

## 2014-06-02 NOTE — Progress Notes (Signed)
Patient ID: [name], male   DOB: 1950-03-11, 64 y.o.   MRN: [id number] PCP: Debbrah Alar   HPI: [name] is a 63 y.o. 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 He also has history of ventricular tachycardia s/p St Jude ICD (single lead, no Corevue) by Dr. Lovena Le, CKD stage IV, and paroxysmal atrial arrhythmias on amiodarone as well as sarcoidosis and hypertension. Cath in 2008 by Dr. Terrence Dupont showed no CAD.  Seen at [facility] and not felt to be a transplant candidate due to renal failure and lack of family support.  Milrinone initiated in January 2014 for low output. 03/01/12 milrinone increased 0.375 mcg/kg/min.   04/26/12 S/P Successful DC-CV for atrial fibrillation. Remains on amiodarone.   Admitted to [facility] 01/09/14-01/15/2014 with an infected PICC. Gram negative bacteremia with antibiotic course. Hospitalization was complicated by VT/VF. Discharge weight was 240 pounds.   He returns for HF follow up. Last visit amio was increased to 400 mg twice a day. Had ICD shock.  He remains on Milrinone 0.375 mcg/kg/min.  Increased palpitations and tachycardia.  Weight at home 232-234 pounds. Taking all medications. Says he cut back torsemide to 40 mg in am and 40 mg in pm.  He has been taking all his meds.  Mild dyspnea with exertion. Ongoing fatigue.   Weight is up 5 lbs.   St Jude ICD interrogated, no further discharges.  Nearing ERI.   ECHO 03/22/12 EF 15%  Echo 6/15: 20-25%  RHC 04/30/13 RA = 4  RV = 20/1/3  PA = 23/8 (15)  PCW = 5  Fick cardiac output/index =4.5/1.9  Thermo CO/CI = 3.9/1.6  PVR =  FA sat = 98%  PA sat = 62%, 64% - On milrinone 0.25  He is not on an ace inhibitor due to elevated creatinine. Not on nitrates due to Viagra use.   Labs:  08/15/12 Creatinine 4.24 Potassium 3.3 09/19/12 Creatinine 3.98, Potassium 3.1, BUN 78 10/07/12 Creatinine 2.62, Potassium 3.4 10/18/12 Creatinine 3.3,  K+3.1 11/08/12 Creatinine 2.15 K 4.0 11/25/12 Creatinine 2.93, K+ 3.5 03/10/13: K+ 2.9, Creatinine 2.63 04/30/13: K+ 3.7, creatinine 2.4 6/15: K 3, creatinine 3.6 09/11/13: K 3.1 Cr 1.87  10/15: K 3.8, creatinine 2.13, HCT 40.7 11/06/13 K 4.0 Creatinine 1.58  12/17/13 K 3.6 Creatinine 2.3  3/16 K 3.9, creatinine 2.02 04/2014: K 3.4 => 3.7,  Creatinine 2.37 => 2.02, TSH/free T4/free T3 normal, AST 45, ALT 40 05/19/2014: L 4.0 Creatinine 1.51 Mag 2.2 BNP 537  ROS: All systems negative except as listed in HPI, PMH and Problem List.  Past Medical History  Diagnosis Date  . CHF (congestive heart failure)   . Sarcoidosis   . Cardiomyopathy, dilated, nonischemic     non ischemic by cath  . Acute on chronic systolic heart failure   . Automatic implantable cardiac defibrillator in situ   . Atrial fibrillation   . NSVT (nonsustained ventricular tachycardia)   . GERD (gastroesophageal reflux disease)   . Hypercholesteremia   . Shortness of breath   . Chronic kidney disease (CKD), stage III (moderate)   . Pacemaker   . Anginal pain   . Gout   . Hypertension     dr [name]  . Coronary artery disease   . Elevated PSA 06/24/2013  . Myocardial infarction   . Headache   . Neuropathy    Current Outpatient Prescriptions  Medication Sig Dispense Refill  . allopurinol (ZYLOPRIM) 100 MG  tablet Take 1 tablet (100 mg total) by mouth daily. 30 tablet 6  . ALPRAZolam (XANAX) 0.5 MG tablet Take 1 tablet (0.5 mg total) by mouth 3 (three) times daily as needed for anxiety. 60 tablet 0  . amiodarone (PACERONE) 200 MG tablet Take 1 tablet (200 mg total) by mouth 2 (two) times daily. 60 tablet 6  . apixaban (ELIQUIS) 5 MG TABS tablet Take 1 tablet (5 mg total) by mouth 2 (two) times daily. 60 tablet 4  . carvedilol (COREG) 6.25 MG tablet Take 1 tablet (6.25 mg total) by mouth 2 (two) times daily with a meal. 60 tablet 3  . diphenhydrAMINE (BENADRYL) 50 MG capsule Take 50 mg by mouth daily as needed for  itching.    Marland Kitchen eplerenone (INSPRA) 25 MG tablet Take 12.5 mg by mouth every other day.     . fluticasone (FLONASE) 50 MCG/ACT nasal spray Place 2 sprays into both nostrils daily as needed for allergies. 16 g 2  . Gabapentin Enacarbil ER (HORIZANT) 300 MG TBCR Take 300 mg by mouth at bedtime as needed (pain).     . hydrOXYzine (ATARAX/VISTARIL) 25 MG tablet Take 1 tablet (25 mg total) by mouth 2 (two) times daily as needed. 60 tablet 2  . ketoconazole (NIZORAL) 2 % cream Apply 1 application topically 2 (two) times daily. X 2 weeks. 15 g 0  . magnesium oxide (MAG-OX) 400 MG tablet Take 400 mg by mouth 2 (two) times daily.    . metolazone (ZAROXOLYN) 2.5 MG tablet Take 1 tablet (2.5 mg total) by mouth daily as needed (for fluid retention). 10 tablet 6  . milrinone (PRIMACOR) 20 MG/100ML SOLN infusion Inject 0.375 mcg/kg/min into the vein continuous. 440mg  in 452ml infusion 2.4 ml per hour    . Oxycodone HCl 20 MG TABS Take 1 tablet (20 mg total) by mouth 3 (three) times daily as needed (pain.). (Patient taking differently: Take 20 mg by mouth 3 (three) times daily as needed (pain.). ) 15 tablet 0  . potassium chloride SA (K-DUR,KLOR-CON) 20 MEQ tablet Take 2 tablets (40 mEq total) by mouth daily. 60 tablet 6  . promethazine (PHENERGAN) 25 MG tablet Take 25 mg by mouth every 6 (six) hours as needed for nausea or vomiting.    . ranitidine (ZANTAC) 150 MG capsule Take 150 mg by mouth daily as needed for heartburn (reflux).    . sertraline (ZOLOFT) 100 MG tablet Take 1 tablet (100 mg total) by mouth daily. 90 tablet 1  . sildenafil (VIAGRA) 100 MG tablet Take 1 tablet (100 mg total) by mouth daily as needed for erectile dysfunction. 10 tablet 3  . sucralfate (CARAFATE) 1 GM/10ML suspension Take 10 mLs (1 g total) by mouth 4 (four) times daily. (Patient taking differently: Take 1 g by mouth 2 (two) times daily. ) 420 mL 1  . torsemide (DEMADEX) 20 MG tablet Take 40 mg by mouth 2 (two) times daily.     Marland Kitchen  zolpidem (AMBIEN) 5 MG tablet TAKE 1.5 TABLETS BY MOUTH EVERY DAY AT BEDTIME 45 tablet 0  . senna-docusate (SENOKOT-S) 8.6-50 MG per tablet Take 1-2 tablets by mouth 2 (two) times daily as needed for mild constipation or moderate constipation. 60 tablet 3   No current facility-administered medications for this encounter.   Filed Vitals:   05/19/14 1552  BP: 116/60  Pulse: 60  Weight: 232 lb (105.235 kg)  SpO2: 97%    PHYSICAL EXAM: General: NAD; Chronically ill appearing.   HEENT:  normal  Neck: supple. JVP 8-9 cm. Carotids 2+ bilat; no bruits. No lymphadenopathy or thyromegaly appreciated.  Cor: PMI laterally displaced. Regular S1 S2 with no S3 and 2/6 HSM LLSB. R upper chest hickman site with milrinone infusing.  Lungs: clear Abdomen: soft, nontender, obese. No bruits or masses. Good bowel sounds.  Mild abdominal distention.  Extremities: no cyanosis, clubbing, rash, no lower extremity edema  Neuro: alert & orientedx3, cranial nerves grossly intact. moves all 4 extremities w/o difficulty. Affect pleasant   ASSESSMENT & PLAN:   1) Chronic systolic HF: Nonischemic cardiomyopathy, EF 20-25% (06/2013). St Jude ICD (single chamber, no Corevue). He has been on home milrinone 0.375 mcg/kg/min since January 2014. NYHA class II-III symptoms. Some volume overload today with wight up 5 lbs.   - Continue Coreg, eplerenone - Increase  Torsemide back to 60 mg in am and 40 mg in pm, BMET/Mg in 2 wks.    - No ACEI with CKD - Continue milrinone at 0.375 mcg/kg/min.   - Check BMET/Mg/BNP now.   2) Atrial arrhythmias: Paroxysmal. He is in NSR today.  Continue amiodarone and apixaban. Recent thyroid tests and LFTs ok, should get yearly eye exams.  3) CKD, stage IV: BMET today.    4) VT/VF: During hospitalization 01/10/14.  He had another episode 5/17 with amio increased to 400 mg twice a day x 1 week then amio 200 mg bid x 2 wks then back to amiodarone 200 daily.  - Device near ERI. Has follow up with Dr  Lovena Le tomorrow.   Follow up in 1 month -CLEGG,AMY NP_C  06/02/2014   Patient seen with NP, agree with the above note.  He cut back on his torsemide, now weight up 5 lbs.  Needs to go back to torsemide 60 qam/40 qpm, check BMET 2 wks.    No further ICD discharges.  Device nearing ERI, has appt with Dr Lovena Le.  Now on amiodarone 200 mg bid, continue for total of 2 wks then back to 200 daily.   Loralie Champagne 06/02/2014 9:45 AM

## 2014-06-03 ENCOUNTER — Encounter: Payer: Self-pay | Admitting: Internal Medicine

## 2014-06-03 ENCOUNTER — Ambulatory Visit (INDEPENDENT_AMBULATORY_CARE_PROVIDER_SITE_OTHER): Payer: Medicare Other | Admitting: Internal Medicine

## 2014-06-03 VITALS — BP 112/68 | HR 102 | Ht 75.0 in | Wt 241.8 lb

## 2014-06-03 DIAGNOSIS — I420 Dilated cardiomyopathy: Secondary | ICD-10-CM

## 2014-06-03 DIAGNOSIS — I5022 Chronic systolic (congestive) heart failure: Secondary | ICD-10-CM

## 2014-06-03 DIAGNOSIS — I429 Cardiomyopathy, unspecified: Secondary | ICD-10-CM | POA: Diagnosis not present

## 2014-06-03 DIAGNOSIS — I472 Ventricular tachycardia, unspecified: Secondary | ICD-10-CM

## 2014-06-03 DIAGNOSIS — Z95810 Presence of automatic (implantable) cardiac defibrillator: Secondary | ICD-10-CM

## 2014-06-03 DIAGNOSIS — I480 Paroxysmal atrial fibrillation: Secondary | ICD-10-CM

## 2014-06-03 LAB — CUP PACEART INCLINIC DEVICE CHECK
Battery Remaining Longevity: 10.4 mo
Date Time Interrogation Session: 20160601121248
HIGH POWER IMPEDANCE MEASURED VALUE: 48.4442
Lead Channel Pacing Threshold Amplitude: 1 V
Lead Channel Pacing Threshold Pulse Width: 0.5 ms
Lead Channel Sensing Intrinsic Amplitude: 12 mV
Lead Channel Setting Pacing Amplitude: 2.5 V
Lead Channel Setting Sensing Sensitivity: 0.3 mV
MDC IDC MSMT BATTERY VOLTAGE: 2.53 V
MDC IDC MSMT LEADCHNL RV IMPEDANCE VALUE: 512.5 Ohm
MDC IDC PG SERIAL: 521670
MDC IDC SET LEADCHNL RV PACING PULSEWIDTH: 0.5 ms
MDC IDC STAT BRADY RV PERCENT PACED: 0.18 %
Zone Setting Detection Interval: 250 ms
Zone Setting Detection Interval: 300 ms
Zone Setting Detection Interval: 340 ms

## 2014-06-03 NOTE — Assessment & Plan Note (Signed)
His device is working normally and he has had no recurrent arrhythmias since his last CHF clinic visit. Will recheck in several months.

## 2014-06-03 NOTE — Assessment & Plan Note (Signed)
He is on 400 mg of amio daily. I would continue this dose for another 3 months before reducing the dose to 300 mg daily.

## 2014-06-03 NOTE — Progress Notes (Signed)
HPI [name] returns today for followup. He is a very pleasant 64 year old man with a long-standing nonischemic cardiomyopathy, chronic class III congestive heart failure, requiring inotropic support. Since then he has had another ICD shock. We do not have the strips to review. His amio was increased from 200 mg daily to 800 mg daily for 2 weeks and is now back to 400 mg daily in divided doses. He had been bothered by fatigue and weakness. He was continued on 400 mg of amiodarone daily. In the interim, he has had dizziness as well as shortness of breath. He has not received an ICD shock since he dose of amio was increased. He denies syncope. He has not had to take metolazone several days ago as his weight has been stable. Allergies  Allergen Reactions  . Nitroglycerin Other (See Comments)    "feels like head is going to bust open"  . Colchicine Nausea Only     Current Outpatient Prescriptions  Medication Sig Dispense Refill  . allopurinol (ZYLOPRIM) 100 MG tablet Take 1 tablet (100 mg total) by mouth daily. 30 tablet 6  . ALPRAZolam (XANAX) 0.5 MG tablet Take 1 tablet (0.5 mg total) by mouth 3 (three) times daily as needed for anxiety. 60 tablet 0  . amiodarone (PACERONE) 200 MG tablet Take 1 tablet (200 mg total) by mouth 2 (two) times daily. 60 tablet 6  . apixaban (ELIQUIS) 5 MG TABS tablet Take 1 tablet (5 mg total) by mouth 2 (two) times daily. 60 tablet 4  . carvedilol (COREG) 6.25 MG tablet Take 1 tablet (6.25 mg total) by mouth 2 (two) times daily with a meal. 60 tablet 3  . diphenhydrAMINE (BENADRYL) 50 MG capsule Take 50 mg by mouth daily as needed for itching.    Marland Kitchen eplerenone (INSPRA) 25 MG tablet Take 12.5 mg by mouth every other day.     . fluticasone (FLONASE) 50 MCG/ACT nasal spray Place 2 sprays into both nostrils daily as needed for allergies. 16 g 2  . gabapentin (NEURONTIN) 600 MG tablet Take 600 mg by mouth daily.    . magnesium oxide (MAG-OX) 400 MG tablet Take  400 mg by mouth 2 (two) times daily.    . metolazone (ZAROXOLYN) 2.5 MG tablet Take 1 tablet (2.5 mg total) by mouth daily as needed (for fluid retention). 10 tablet 6  . milrinone (PRIMACOR) 20 MG/100ML SOLN infusion Inject 0.375 mcg/kg/min into the vein continuous. 440mg  in 449ml infusion 2.4 ml per hour    . NEXIUM 40 MG capsule Take 1 tablet by mouth daily.    . Oxycodone HCl 20 MG TABS Take 1 tablet (20 mg total) by mouth 3 (three) times daily as needed (pain.). 15 tablet 0  . potassium chloride SA (K-DUR,KLOR-CON) 20 MEQ tablet Take 2 tablets (40 mEq total) by mouth daily. 60 tablet 6  . promethazine (PHENERGAN) 25 MG tablet Take 25 mg by mouth every 6 (six) hours as needed for nausea or vomiting.    . ranitidine (ZANTAC) 150 MG capsule Take 150 mg by mouth daily as needed for heartburn (reflux).    Marland Kitchen senna-docusate (SENOKOT-S) 8.6-50 MG per tablet Take 1-2 tablets by mouth 2 (two) times daily as needed for mild constipation or moderate constipation. 60 tablet 3  . sertraline (ZOLOFT) 100 MG tablet Take 1 tablet (100 mg total) by mouth daily. 90 tablet 1  . sildenafil (VIAGRA) 100 MG tablet Take 1 tablet (100 mg total) by mouth daily  as needed for erectile dysfunction. 10 tablet 3  . sucralfate (CARAFATE) 1 GM/10ML suspension Take 10 mLs (1 g total) by mouth 4 (four) times daily. 420 mL 1  . torsemide (DEMADEX) 20 MG tablet TAKE 60 MG BY MOUTH IN THE AM & 40 MG BY MOUTH IN THE PM    . zolpidem (AMBIEN) 5 MG tablet TAKE 1.5 TABLETS BY MOUTH EVERY DAY AT BEDTIME 45 tablet 0   No current facility-administered medications for this visit.     Past Medical History  Diagnosis Date  . CHF (congestive heart failure)   . Sarcoidosis   . Cardiomyopathy, dilated, nonischemic     non ischemic by cath  . Acute on chronic systolic heart failure   . Automatic implantable cardiac defibrillator in situ   . Atrial fibrillation   . NSVT (nonsustained ventricular tachycardia)   . GERD (gastroesophageal  reflux disease)   . Hypercholesteremia   . Shortness of breath   . Chronic kidney disease (CKD), stage III (moderate)   . Pacemaker   . Anginal pain   . Gout   . Hypertension     dr [name]  . Coronary artery disease   . Elevated PSA 06/24/2013  . Myocardial infarction   . Headache   . Neuropathy     ROS:   All systems reviewed and negative except as noted in the HPI.   Past Surgical History  Procedure Laterality Date  . Pacemaker insertion  2009    with ICD  . Tee without cardioversion  01/17/2011    Procedure: TRANSESOPHAGEAL ECHOCARDIOGRAM (TEE);  Surgeon: Birdie Riddle, MD;  Location: [place];  Service: Cardiovascular;  Laterality: N/A;  . Cardioversion  01/17/2011    Procedure: CARDIOVERSION;  Surgeon: Birdie Riddle, MD;  Location: [place];  Service: Cardiovascular;  Laterality: N/A;  . Cardiac catheterization    . Insert / replace / remove pacemaker    . Anterior cervical decomp/discectomy fusion  08/21/2011    Procedure: ANTERIOR CERVICAL DECOMPRESSION/DISCECTOMY FUSION 2 LEVELS;  Surgeon: Marybelle Killings, MD;  Location: [place];  Service: Orthopedics;  Laterality: N/A;  C5-6, C6-7 Anterior Cervical Discectomy and Fusion, allograft, plate  . Tee without cardioversion N/A 02/16/2012    Procedure: TRANSESOPHAGEAL ECHOCARDIOGRAM (TEE);  Surgeon: Jolaine Artist, MD;  Location: [facility] ENDOSCOPY;  Service: Cardiovascular;  Laterality: N/A;  original case scheduled under his dad (who is deceased), rescheduled under correct mrn/pt/dob. [place]/dl  . Tee without cardioversion N/A 03/22/2012    Procedure: TRANSESOPHAGEAL ECHOCARDIOGRAM (TEE);  Surgeon: Lelon Perla, MD;  Location: [place];  Service: Cardiovascular;  Laterality: N/A;  . Cardioversion N/A 03/22/2012    Procedure: CARDIOVERSION;  Surgeon: Lelon Perla, MD;  Location: [facility] ENDOSCOPY;  Service: Cardiovascular;  Laterality: N/A;  . Cardioversion N/A 04/26/2012    Procedure: CARDIOVERSION;  Surgeon: Jolaine Artist, MD;  Location: [facility] ENDOSCOPY;  Service: Cardiovascular;  Laterality: N/A;  . Central venous catheter tunneled insertion single lumen  09/16/2012    right IJ  . Cardioversion N/A 11/08/2012    Procedure: CARDIOVERSION;  Surgeon: Jolaine Artist, MD;  Location: [facility] ENDOSCOPY;  Service: Cardiovascular;  Laterality: N/A;  . Back surgery  1987, lower    Ruptured disk repair  . Peripherally inserted central catheter insertion  2013  . Right heart catheterization N/A 01/26/2012    Procedure: RIGHT HEART CATH;  Surgeon: Jolaine Artist, MD;  Location: [facility] CATH LAB;  Service: Cardiovascular;  Laterality: N/A;  . Right  heart catheterization N/A 04/30/2013    Procedure: RIGHT HEART CATH;  Surgeon: Jolaine Artist, MD;  Location: [facility] CATH LAB;  Service: Cardiovascular;  Laterality: N/A;     Family History  Problem Relation Age of Onset  . Heart disease    . Heart failure    . Stroke    . Anesthesia problems Neg Hx   . Hypotension Neg Hx   . Malignant hyperthermia Neg Hx   . Pseudochol deficiency Neg Hx   . Heart failure Sister   . Heart attack Sister   . Hyperlipidemia Mother      History   Social History  . Marital Status: Single    Spouse Name: N/A  . Number of Children: N/A  . Years of Education: N/A   Occupational History  . Not on file.   Social History Main Topics  . Smoking status: Never Smoker   . Smokeless tobacco: Never Used  . Alcohol Use: No  . Drug Use: Yes    Special: "Crack" cocaine, Cocaine     Comment: last use august 2004  . Sexual Activity: Not Currently   Other Topics Concern  . Not on file   Social History Narrative   3 sons- one in [place], 1 in [place], on in [place]   Retired from post office- on disability   Completed Bachelors from [employer]     BP 112/68 mmHg  Pulse 102  Ht 6\' 3"  (1.905 m)  Wt 241 lb 12.8 oz (109.68 kg)  BMI 30.22 kg/m2  Physical Exam:  Stable appearing middle-aged man, NAD HEENT: Unremarkable Neck:  7 cm JVD, no  thyromegally Back:  No CVA tenderness Lungs:  Clear except for basilar rales HEART:  Regular rate rhythm, no murmurs, no rubs, no clicks, soft S3 is present Abd:  soft, obese, positive bowel sounds, no organomegally, no rebound, no guarding Ext:  2 plus pulses, no edema, no cyanosis, no clubbing Skin:  No rashes no nodules Neuro:  CN II through XII intact, motor grossly intact  EKG - sinus tachycardia  DEVICE  Normal device function.  See PaceArt for details.   Assess/Plan:

## 2014-06-03 NOTE — Assessment & Plan Note (Signed)
His symptoms are mostly class 2 on IV milrinone. He is dosing metolazone based on his weight. Will follow.

## 2014-06-03 NOTE — Patient Instructions (Addendum)
Medication Instructions:  .instcurt   Labwork: NONE  Testing/Procedures: NONE  Follow-Up:  Remote monitoring is used to monitor your Pacemaker or ICD from home. This monitoring reduces the number of office visits required to check your device to one time per year. It allows [place] to keep an eye on the functioning of your device to ensure it is working properly. You are scheduled for a device check from home on 09/02/2014. You may send your transmission at any time that day. If you have a wireless device, the transmission will be sent automatically. After your physician reviews your transmission, you will receive a postcard with your next transmission date.  Your physician recommends that you schedule a follow-up appointment in: 3  Months 09/08/2014 @ 9:45am   Any Other Special Instructions Will Be Listed Below (If Applicable).

## 2014-06-03 NOTE — Assessment & Plan Note (Signed)
He is maintaining NSR. Will follow.  

## 2014-06-04 DIAGNOSIS — I5023 Acute on chronic systolic (congestive) heart failure: Secondary | ICD-10-CM | POA: Diagnosis not present

## 2014-06-04 DIAGNOSIS — I5022 Chronic systolic (congestive) heart failure: Secondary | ICD-10-CM | POA: Diagnosis not present

## 2014-06-04 DIAGNOSIS — I509 Heart failure, unspecified: Secondary | ICD-10-CM | POA: Diagnosis not present

## 2014-06-04 DIAGNOSIS — R0602 Shortness of breath: Secondary | ICD-10-CM | POA: Diagnosis not present

## 2014-06-08 ENCOUNTER — Encounter: Payer: Self-pay | Admitting: Internal Medicine

## 2014-06-08 DIAGNOSIS — Z79899 Other long term (current) drug therapy: Secondary | ICD-10-CM | POA: Diagnosis not present

## 2014-06-08 DIAGNOSIS — G894 Chronic pain syndrome: Secondary | ICD-10-CM | POA: Diagnosis not present

## 2014-06-08 DIAGNOSIS — M545 Low back pain: Secondary | ICD-10-CM | POA: Diagnosis not present

## 2014-06-11 ENCOUNTER — Encounter: Payer: Self-pay | Admitting: Internal Medicine

## 2014-06-11 DIAGNOSIS — I509 Heart failure, unspecified: Secondary | ICD-10-CM | POA: Diagnosis not present

## 2014-06-11 DIAGNOSIS — R0602 Shortness of breath: Secondary | ICD-10-CM | POA: Diagnosis not present

## 2014-06-12 ENCOUNTER — Encounter: Payer: Self-pay | Admitting: Internal Medicine

## 2014-06-18 DIAGNOSIS — I5023 Acute on chronic systolic (congestive) heart failure: Secondary | ICD-10-CM | POA: Diagnosis not present

## 2014-06-18 DIAGNOSIS — R0602 Shortness of breath: Secondary | ICD-10-CM | POA: Diagnosis not present

## 2014-06-18 DIAGNOSIS — I509 Heart failure, unspecified: Secondary | ICD-10-CM | POA: Diagnosis not present

## 2014-06-25 DIAGNOSIS — I5023 Acute on chronic systolic (congestive) heart failure: Secondary | ICD-10-CM | POA: Diagnosis not present

## 2014-06-25 DIAGNOSIS — I509 Heart failure, unspecified: Secondary | ICD-10-CM | POA: Diagnosis not present

## 2014-06-25 DIAGNOSIS — R0602 Shortness of breath: Secondary | ICD-10-CM | POA: Diagnosis not present

## 2014-06-26 ENCOUNTER — Encounter: Payer: Self-pay | Admitting: Internal Medicine

## 2014-07-02 ENCOUNTER — Encounter (HOSPITAL_COMMUNITY): Payer: Self-pay

## 2014-07-02 ENCOUNTER — Ambulatory Visit (HOSPITAL_COMMUNITY)
Admission: RE | Admit: 2014-07-02 | Discharge: 2014-07-02 | Disposition: A | Discharge status: Home / Self Care | Payer: Medicare Other | Source: Ambulatory Visit | Attending: Internal Medicine | Admitting: Internal Medicine

## 2014-07-02 VITALS — BP 110/70 | HR 89 | Wt 234.5 lb

## 2014-07-02 DIAGNOSIS — I472 Ventricular tachycardia, unspecified: Secondary | ICD-10-CM

## 2014-07-02 DIAGNOSIS — I498 Other specified cardiac arrhythmias: Secondary | ICD-10-CM | POA: Diagnosis not present

## 2014-07-02 DIAGNOSIS — G894 Chronic pain syndrome: Secondary | ICD-10-CM | POA: Diagnosis not present

## 2014-07-02 DIAGNOSIS — N184 Chronic kidney disease, stage 4 (severe): Secondary | ICD-10-CM | POA: Insufficient documentation

## 2014-07-02 DIAGNOSIS — I5022 Chronic systolic (congestive) heart failure: Secondary | ICD-10-CM | POA: Insufficient documentation

## 2014-07-02 DIAGNOSIS — M79606 Pain in leg, unspecified: Secondary | ICD-10-CM | POA: Diagnosis not present

## 2014-07-02 DIAGNOSIS — Z79899 Other long term (current) drug therapy: Secondary | ICD-10-CM | POA: Diagnosis not present

## 2014-07-02 DIAGNOSIS — Z9981 Dependence on supplemental oxygen: Secondary | ICD-10-CM | POA: Diagnosis not present

## 2014-07-02 DIAGNOSIS — M150 Primary generalized (osteo)arthritis: Secondary | ICD-10-CM | POA: Diagnosis not present

## 2014-07-02 DIAGNOSIS — I509 Heart failure, unspecified: Secondary | ICD-10-CM | POA: Diagnosis not present

## 2014-07-02 DIAGNOSIS — M545 Low back pain: Secondary | ICD-10-CM | POA: Diagnosis not present

## 2014-07-02 DIAGNOSIS — R0602 Shortness of breath: Secondary | ICD-10-CM | POA: Diagnosis not present

## 2014-07-02 LAB — BASIC METABOLIC PANEL
ANION GAP: 8 (ref 5–15)
BUN: 33 mg/dL — ABNORMAL HIGH (ref 6–20)
CALCIUM: 9.4 mg/dL (ref 8.9–10.3)
CO2: 26 mmol/L (ref 22–32)
Chloride: 106 mmol/L (ref 101–111)
Creatinine, Ser: 1.53 mg/dL — ABNORMAL HIGH (ref 0.61–1.24)
GFR, EST AFRICAN AMERICAN: 54 mL/min — AB (ref >60)
GFR, EST NON AFRICAN AMERICAN: 46 mL/min — AB (ref >60)
Glucose, Bld: 113 mg/dL — ABNORMAL HIGH (ref 65–99)
Potassium: 3.4 mmol/L — ABNORMAL LOW (ref 3.5–5.1)
Sodium: 140 mmol/L (ref 135–145)

## 2014-07-02 LAB — CBC
HCT: 39.2 % (ref 39.0–52.0)
Hemoglobin: 13.5 g/dL (ref 13.0–17.0)
MCH: 30.3 pg (ref 26.0–34.0)
MCHC: 34.4 g/dL (ref 30.0–36.0)
MCV: 87.9 fL (ref 78.0–100.0)
PLATELETS: 173 10*3/uL (ref 150–400)
RBC: 4.46 MIL/uL (ref 4.22–5.81)
RDW: 14.1 % (ref 11.5–15.5)
WBC: 5.9 10*3/uL (ref 4.0–10.5)

## 2014-07-02 LAB — BRAIN NATRIURETIC PEPTIDE: B Natriuretic Peptide: 354.6 pg/mL — ABNORMAL HIGH (ref 0.0–100.0)

## 2014-07-02 NOTE — Progress Notes (Signed)
Patient ID: IZAYIAH TIBBITTS, male   DOB: 11-Mar-1950, 64 y.o.   MRN: [id number] PCP: Debbrah Alar   HPI: Mr. Matlock is a 64 y.o. 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 He also has history of ventricular tachycardia s/p St Jude ICD (single lead, no Corevue) by Dr. Lovena Le, CKD stage IV, and paroxysmal atrial arrhythmias on amiodarone as well as sarcoidosis and hypertension. Cath in 2008 by Dr. Terrence Dupont showed no CAD.  Seen at [facility] and not felt to be a transplant candidate due to renal failure and lack of family support.  Milrinone initiated in January 2014 for low output. 03/01/12 milrinone increased 0.375 mcg/kg/min.   04/26/12 S/P Successful DC-CV for atrial fibrillation. Remains on amiodarone.   Admitted to [facility] 01/09/14-01/15/2014 with an infected PICC. Gram negative bacteremia with antibiotic course. Hospitalization was complicated by VT/VF. Discharge weight was 240 pounds.   He returns for HF follow up. No further ICD shocks.  He remains on Milrinone 0.375 mcg/kg/min.  Weight stable.  Feeling pretty good.  Has been to [facility] and [facility] with no problems. No dyspnea walking on flat ground.  Some dyspnea with steps.  No lightheadedness or palpitations.    ECHO 03/22/12 EF 15%  Echo 6/15: 20-25%  RHC 04/30/13 RA = 4  RV = 20/1/3  PA = 23/8 (15)  PCW = 5  Fick cardiac output/index =4.5/1.9  Thermo CO/CI = 3.9/1.6  PVR =  FA sat = 98%  PA sat = 62%, 64% - On milrinone 0.25  He is not on an ace inhibitor due to elevated creatinine. Not on nitrates due to Viagra use.   Labs:  08/15/12 Creatinine 4.24 Potassium 3.3 09/19/12 Creatinine 3.98, Potassium 3.1, BUN 78 10/07/12 Creatinine 2.62, Potassium 3.4 10/18/12 Creatinine 3.3, K+3.1 11/08/12 Creatinine 2.15 K 4.0 11/25/12 Creatinine 2.93, K+ 3.5 03/10/13: K+ 2.9, Creatinine 2.63 04/30/13: K+ 3.7, creatinine 2.4 6/15: K 3, creatinine 3.6 09/11/13: K  3.1 Cr 1.87  10/15: K 3.8, creatinine 2.13, HCT 40.7 11/06/13 K 4.0 Creatinine 1.58  12/17/13 K 3.6 Creatinine 2.3  3/16 K 3.9, creatinine 2.02 04/2014: K 3.4 => 3.7,  Creatinine 2.37 => 2.02, TSH/free T4/free T3 normal, AST 45, ALT 40 05/19/2014: L 4.0 Creatinine 1.51 Mag 2.2 BNP 537  ECG: NSR, 1st degree AV block, LAFB, nonspecific T wave flattening.   ROS: All systems negative except as listed in HPI, PMH and Problem List.  Past Medical History  Diagnosis Date  . CHF (congestive heart failure)   . Sarcoidosis   . Cardiomyopathy, dilated, nonischemic     non ischemic by cath  . Acute on chronic systolic heart failure   . Automatic implantable cardiac defibrillator in situ   . Atrial fibrillation   . NSVT (nonsustained ventricular tachycardia)   . GERD (gastroesophageal reflux disease)   . Hypercholesteremia   . Shortness of breath   . Chronic kidney disease (CKD), stage III (moderate)   . Pacemaker   . Anginal pain   . Gout   . Hypertension     dr [name]  . Coronary artery disease   . Elevated PSA 06/24/2013  . Myocardial infarction   . Headache   . Neuropathy    Current Outpatient Prescriptions  Medication Sig Dispense Refill  . allopurinol (ZYLOPRIM) 100 MG tablet Take 1 tablet (100 mg total) by mouth daily. 30 tablet 6  . ALPRAZolam (XANAX) 0.5 MG tablet Take 1 tablet (0.5 mg  total) by mouth 3 (three) times daily as needed for anxiety. 60 tablet 0  . amiodarone (PACERONE) 200 MG tablet Take 1 tablet (200 mg total) by mouth 2 (two) times daily. 60 tablet 6  . apixaban (ELIQUIS) 5 MG TABS tablet Take 1 tablet (5 mg total) by mouth 2 (two) times daily. 60 tablet 4  . carvedilol (COREG) 6.25 MG tablet Take 1 tablet (6.25 mg total) by mouth 2 (two) times daily with a meal. 60 tablet 3  . diphenhydrAMINE (BENADRYL) 50 MG capsule Take 50 mg by mouth daily as needed for itching.    Marland Kitchen eplerenone (INSPRA) 25 MG tablet Take 12.5 mg by mouth every other day.     . fluticasone  (FLONASE) 50 MCG/ACT nasal spray Place 2 sprays into both nostrils daily as needed for allergies. 16 g 2  . gabapentin (NEURONTIN) 600 MG tablet Take 600 mg by mouth daily.    . magnesium oxide (MAG-OX) 400 MG tablet Take 400 mg by mouth 2 (two) times daily.    . metolazone (ZAROXOLYN) 2.5 MG tablet Take 1 tablet (2.5 mg total) by mouth daily as needed (for fluid retention). 10 tablet 6  . milrinone (PRIMACOR) 20 MG/100ML SOLN infusion Inject 0.375 mcg/kg/min into the vein continuous. 440mg  in 444ml infusion 2.4 ml per hour    . NEXIUM 40 MG capsule Take 1 tablet by mouth daily.    . Oxycodone HCl 20 MG TABS Take 1 tablet (20 mg total) by mouth 3 (three) times daily as needed (pain.). 15 tablet 0  . potassium chloride SA (K-DUR,KLOR-CON) 20 MEQ tablet Take 2 tablets (40 mEq total) by mouth daily. 60 tablet 6  . promethazine (PHENERGAN) 25 MG tablet Take 25 mg by mouth every 6 (six) hours as needed for nausea or vomiting.    . ranitidine (ZANTAC) 150 MG capsule Take 150 mg by mouth daily as needed for heartburn (reflux).    Marland Kitchen senna-docusate (SENOKOT-S) 8.6-50 MG per tablet Take 1-2 tablets by mouth 2 (two) times daily as needed for mild constipation or moderate constipation. 60 tablet 3  . sertraline (ZOLOFT) 100 MG tablet Take 1 tablet (100 mg total) by mouth daily. 90 tablet 1  . sildenafil (VIAGRA) 100 MG tablet Take 1 tablet (100 mg total) by mouth daily as needed for erectile dysfunction. 10 tablet 3  . sucralfate (CARAFATE) 1 GM/10ML suspension Take 10 mLs (1 g total) by mouth 4 (four) times daily. 420 mL 1  . torsemide (DEMADEX) 20 MG tablet TAKE 60 MG BY MOUTH IN THE AM & 40 MG BY MOUTH IN THE PM    . zolpidem (AMBIEN) 5 MG tablet TAKE 1.5 TABLETS BY MOUTH EVERY DAY AT BEDTIME 45 tablet 0   No current facility-administered medications for this encounter.   Filed Vitals:   05/19/14 1552  BP: 116/60  Pulse: 60  Weight: 232 lb (105.235 kg)  SpO2: 97%    PHYSICAL EXAM: General: NAD;  Chronically ill appearing.   HEENT: normal  Neck: supple. JVP not elevated. Carotids 2+ bilat; no bruits. No lymphadenopathy or thyromegaly appreciated.  Cor: PMI laterally displaced. Regular S1 S2 with no S3 and 2/6 HSM LLSB. R upper chest hickman site with milrinone infusing.  Lungs: clear Abdomen: soft, nontender, obese. No bruits or masses. Good bowel sounds.  Mild abdominal distention.  Extremities: no cyanosis, clubbing, rash, no lower extremity edema  Neuro: alert & orientedx3, cranial nerves grossly intact. moves all 4 extremities w/o difficulty. Affect pleasant  ASSESSMENT & PLAN:   1) Chronic systolic HF: Nonischemic cardiomyopathy, EF 20-25% (06/2013). St Jude ICD (single chamber, no Corevue). He has been on home milrinone 0.375 mcg/kg/min since January 2014. NYHA class II-III symptoms. Volume looks stable today. - Continue Coreg, eplerenone - Continue torsemide 60 mg in am and 40 mg in pm, BMET/BNP today.    - No ACEI with CKD - Continue milrinone at 0.375 mcg/kg/min.   2) Atrial arrhythmias: Paroxysmal. He is in NSR today.  Continue amiodarone and apixaban. Recent thyroid tests and LFTs ok, should get yearly eye exams.  3) CKD, stage IV: BMET today.    4) VT/VF: During hospitalization 01/10/14.  He had another episode 05/19/14 with amio increased to 400 mg twice a day x 1 week then amio 200 mg bid x 3 months (9/16) then back to amiodarone 200 daily.  - Device near ERI. Seeing Dr Lovena Le.    Follow up in 6 wks  Loralie Champagne 07/02/2014

## 2014-07-07 ENCOUNTER — Telehealth: Payer: Self-pay | Admitting: Family

## 2014-07-07 DIAGNOSIS — F419 Anxiety disorder, unspecified: Secondary | ICD-10-CM

## 2014-07-07 DIAGNOSIS — M47817 Spondylosis without myelopathy or radiculopathy, lumbosacral region: Secondary | ICD-10-CM | POA: Diagnosis not present

## 2014-07-07 DIAGNOSIS — M5137 Other intervertebral disc degeneration, lumbosacral region: Secondary | ICD-10-CM | POA: Diagnosis not present

## 2014-07-07 MED ORDER — ALPRAZOLAM 0.5 MG PO TABS: 0.5000 mg | ORAL_TABLET | Freq: Three times a day (TID) | ORAL | Status: DC | PRN

## 2014-07-07 NOTE — Telephone Encounter (Signed)
Spoke with pt and advised him that we show he has been taking alprazolam. Pt states that is what he was trying to request and couldn't remember the name of it. Last Rx 03/30/14, #60. Pt has f/u 08/2014. Rx printed and forwarded to Provider for signature.

## 2014-07-07 NOTE — Telephone Encounter (Signed)
Relation to pt: self Call back number: [phone] Pharmacy: [id number] - [name], [place] [phone] (Phone) [phone] (Fax)         Reason for call:  Pt requesting a refill LORazepam (ATIVAN)

## 2014-07-08 NOTE — Telephone Encounter (Signed)
Rx faxed to pharmacy below.

## 2014-07-09 DIAGNOSIS — I509 Heart failure, unspecified: Secondary | ICD-10-CM | POA: Diagnosis not present

## 2014-07-09 DIAGNOSIS — I5023 Acute on chronic systolic (congestive) heart failure: Secondary | ICD-10-CM | POA: Diagnosis not present

## 2014-07-09 DIAGNOSIS — R0602 Shortness of breath: Secondary | ICD-10-CM | POA: Diagnosis not present

## 2014-07-09 DIAGNOSIS — I5022 Chronic systolic (congestive) heart failure: Secondary | ICD-10-CM | POA: Diagnosis not present

## 2014-07-09 IMAGING — CR DG KNEE COMPLETE 4+V*L*
4 series · 4 of 4 positions shown · non-contrast
Comparison: None.

CLINICAL DATA: Diffuse leg pain.

EXAM:
LEFT KNEE - COMPLETE 4+ VIEW

[t knee ap left]
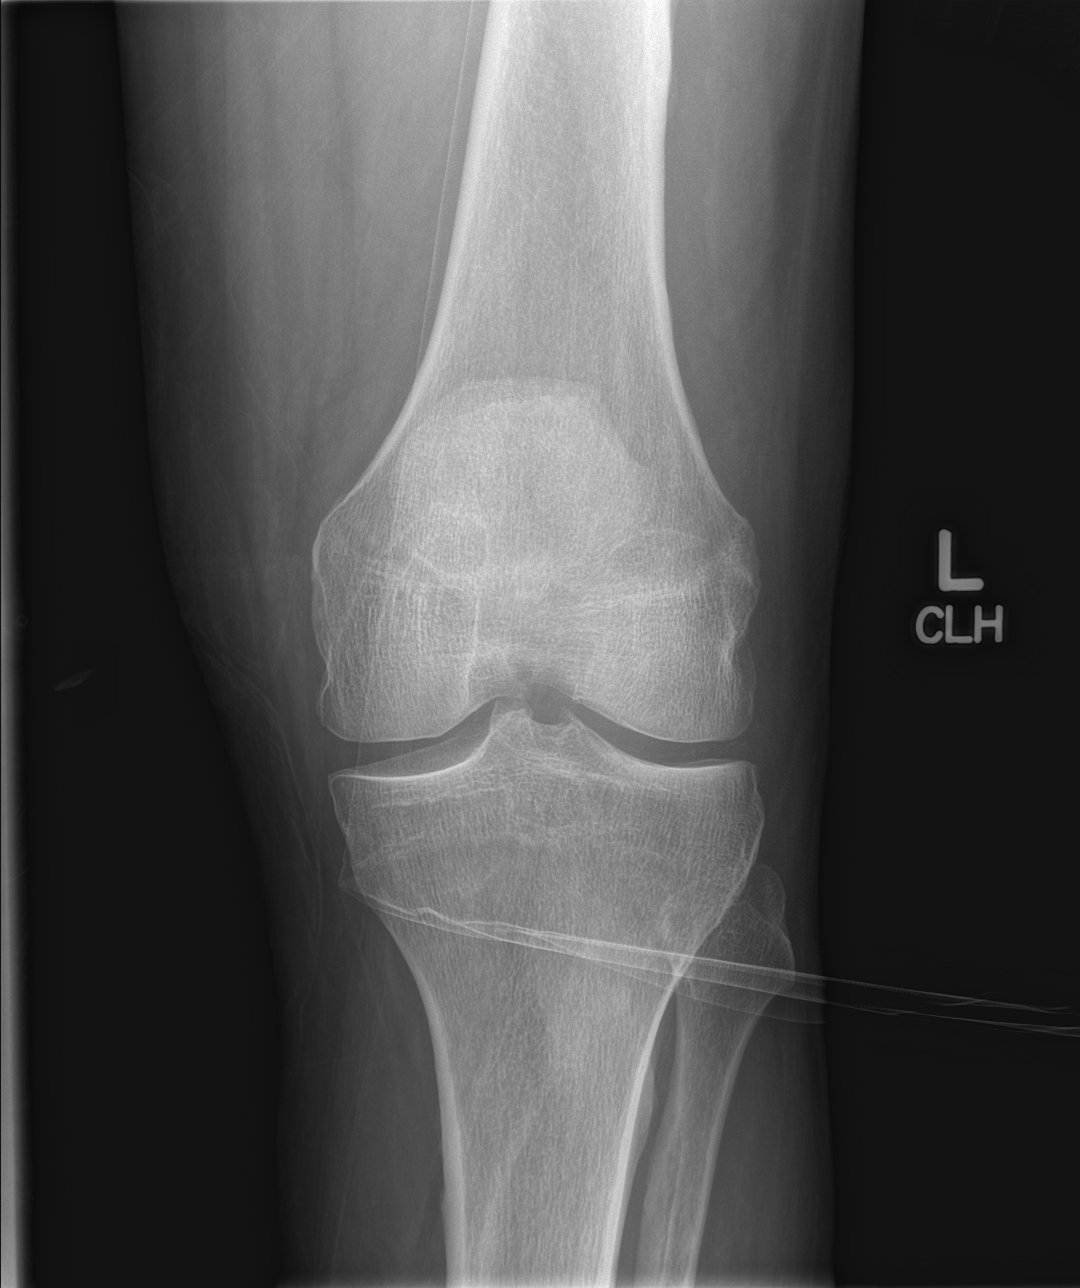

[t knee obl left (1 of 2)]
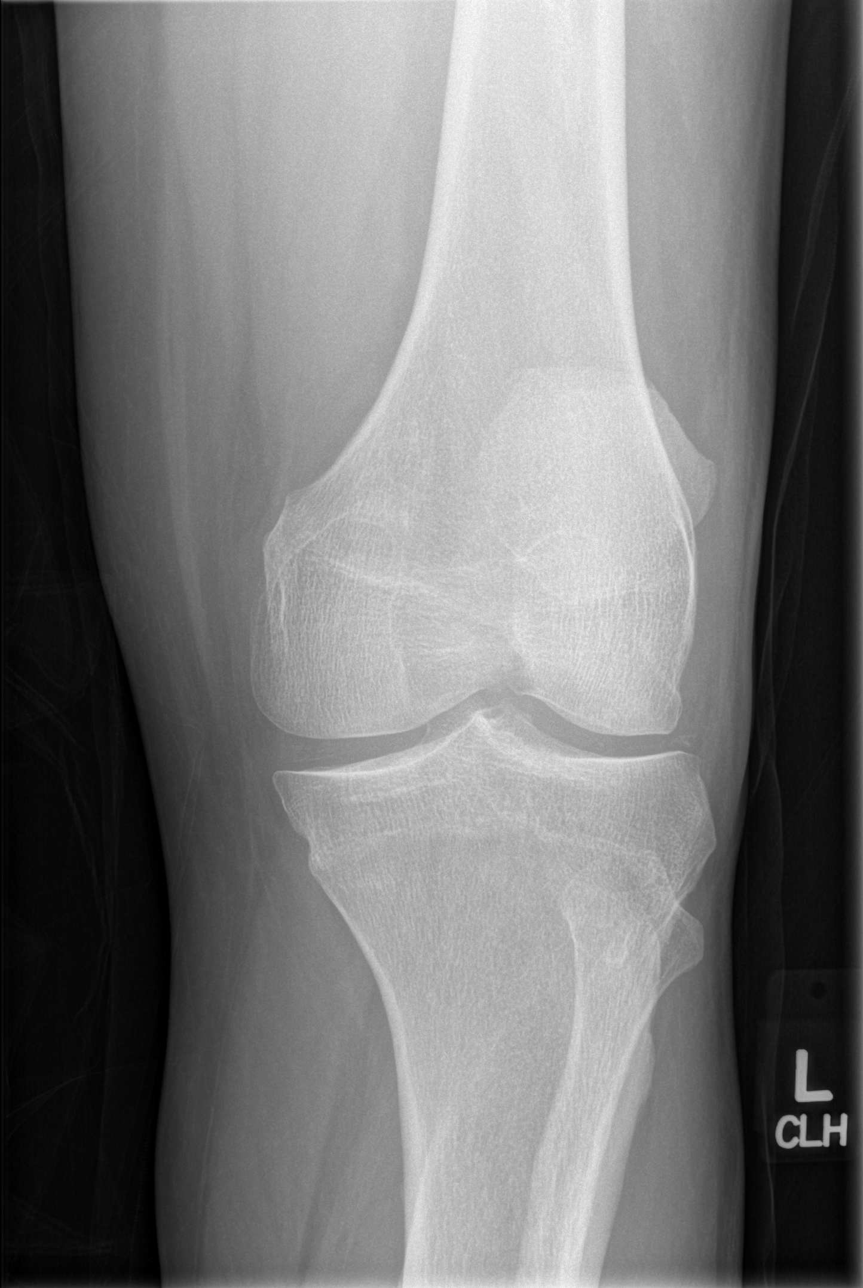

[t knee obl left (2 of 2)]
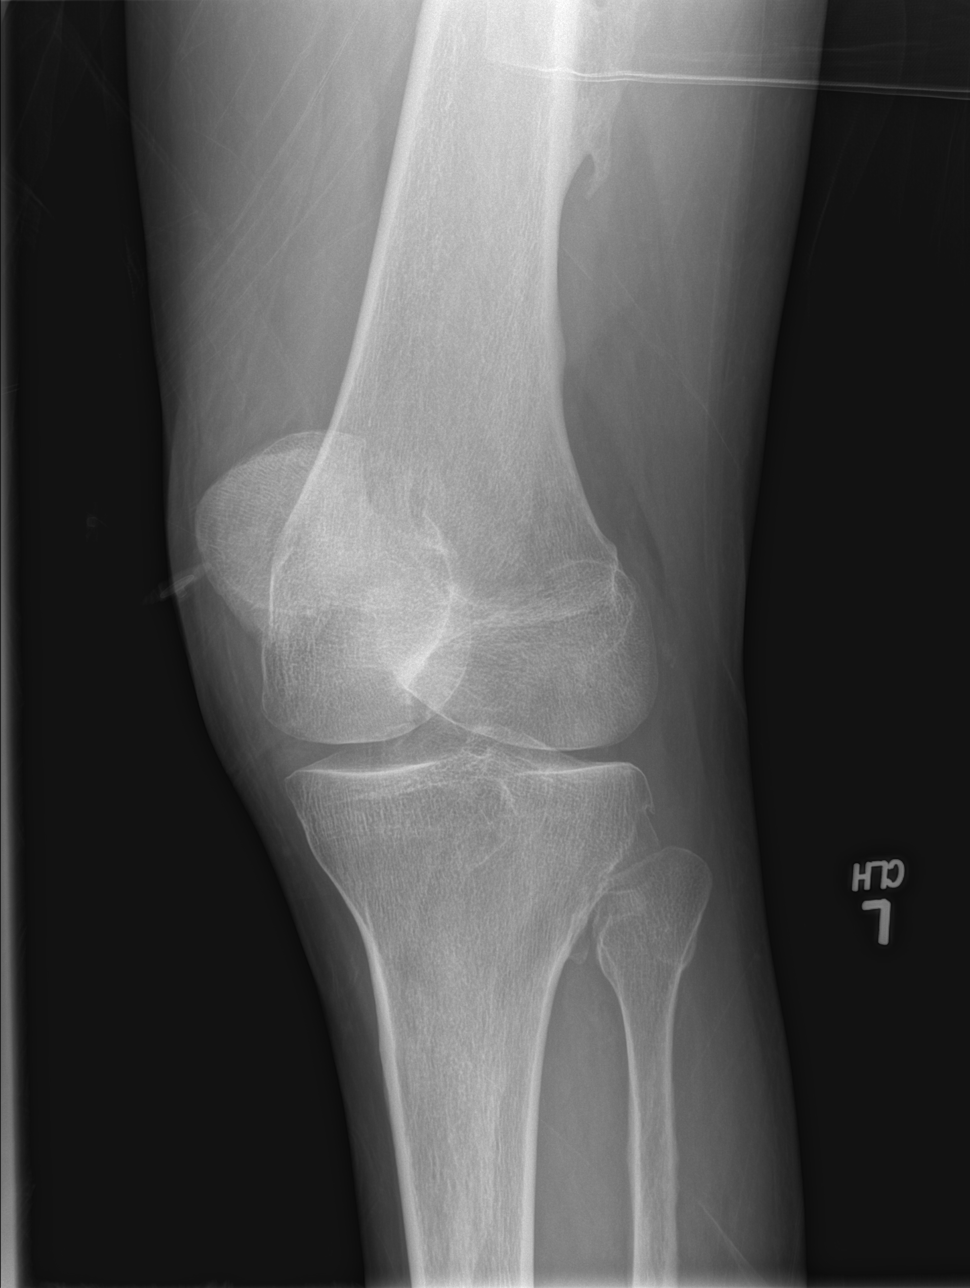

[x knee lat left]
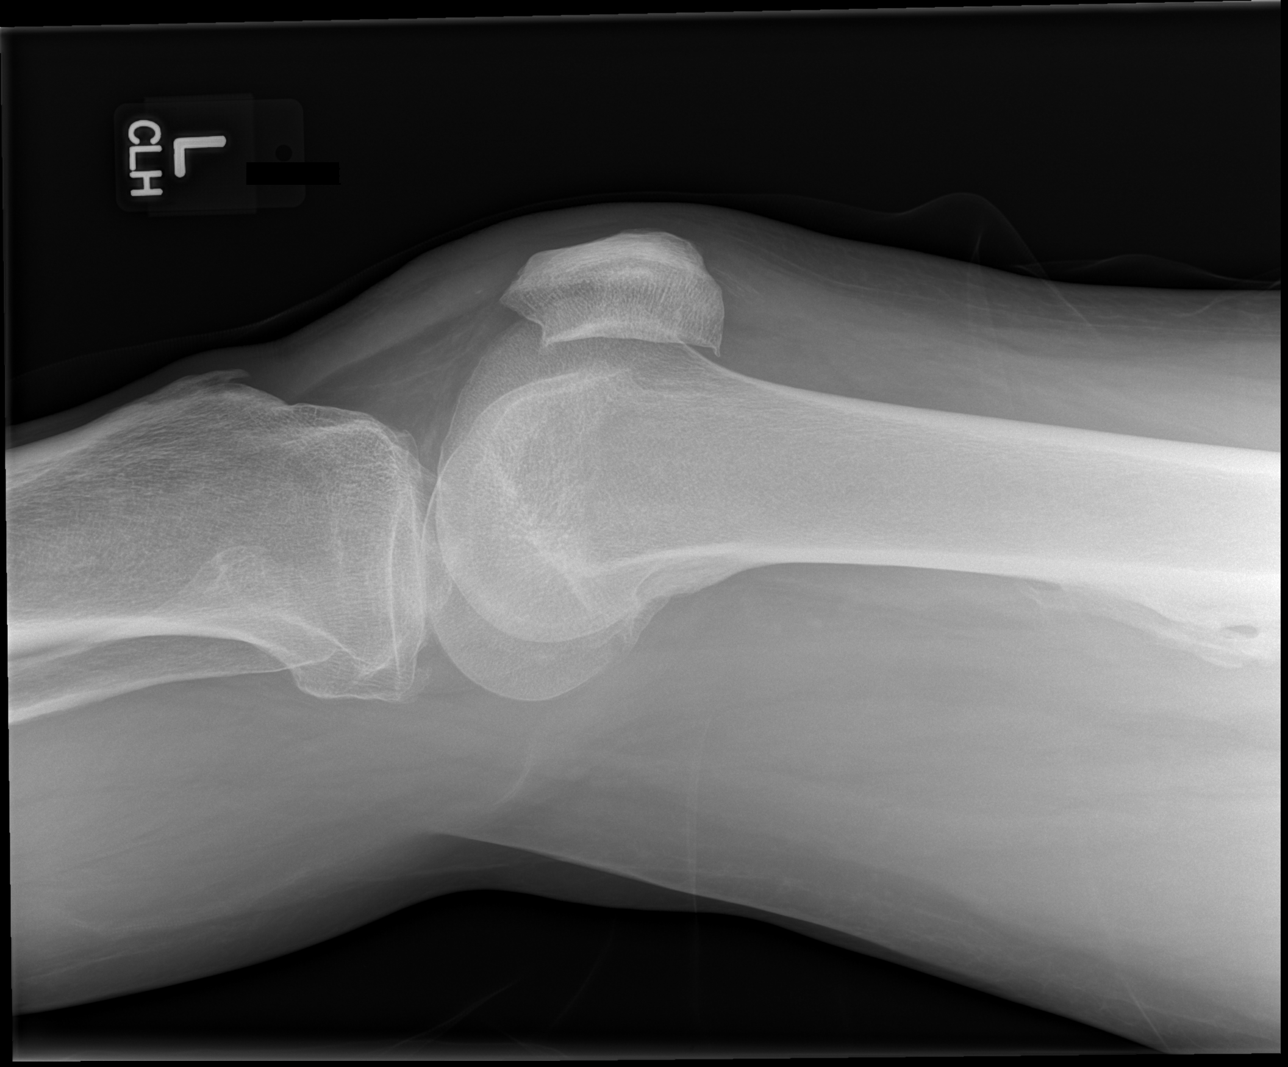

[4 of 4 positions shown; findings below may reference images not displayed]

FINDINGS: There is no evidence of fracture, dislocation, or joint effusion.
Sharp in the tibial spines and marginal spur formation identified.
There is no fracture or subluxation noted. Soft tissues are
unremarkable.
IMPRESSION: 1. Osteoarthritis.
2. No acute findings noted.

## 2014-07-09 IMAGING — CR DG FOOT COMPLETE 3+V*L*
1 series · 1 of 1 positions shown · non-contrast
Comparison: None.

CLINICAL DATA: Diffuse lower leg pain

EXAM:
LEFT FOOT - COMPLETE 3+ VIEW

[x ankle lat left]
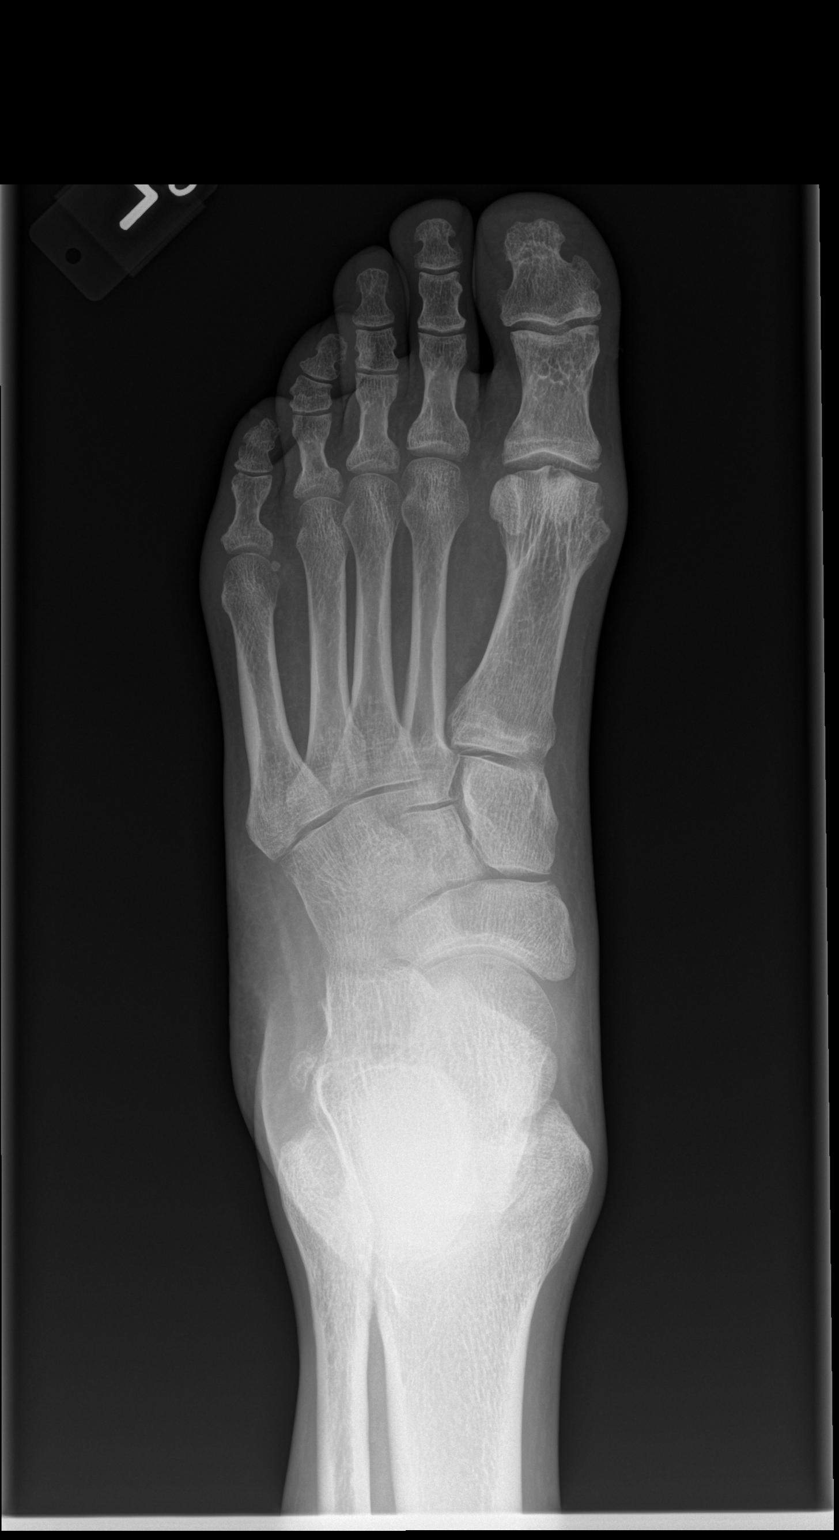

[1 of 1 positions shown; findings below may reference images not displayed]

FINDINGS: There is a small, intra-articular fracture involving the medial of
base of the first proximal phalanx. There is no evidence of
arthropathy or other focal bone abnormality. Soft tissues are
unremarkable.
IMPRESSION: Small intra-articular fracture involves the base of the first
proximal phalanx.

## 2014-07-09 IMAGING — CR DG FOOT COMPLETE 3+V*L*
2 series · 2 of 2 positions shown · non-contrast
Comparison: None.

CLINICAL DATA: Diffuse lower leg pain

EXAM:
LEFT FOOT - COMPLETE 3+ VIEW

[x foot obl left]
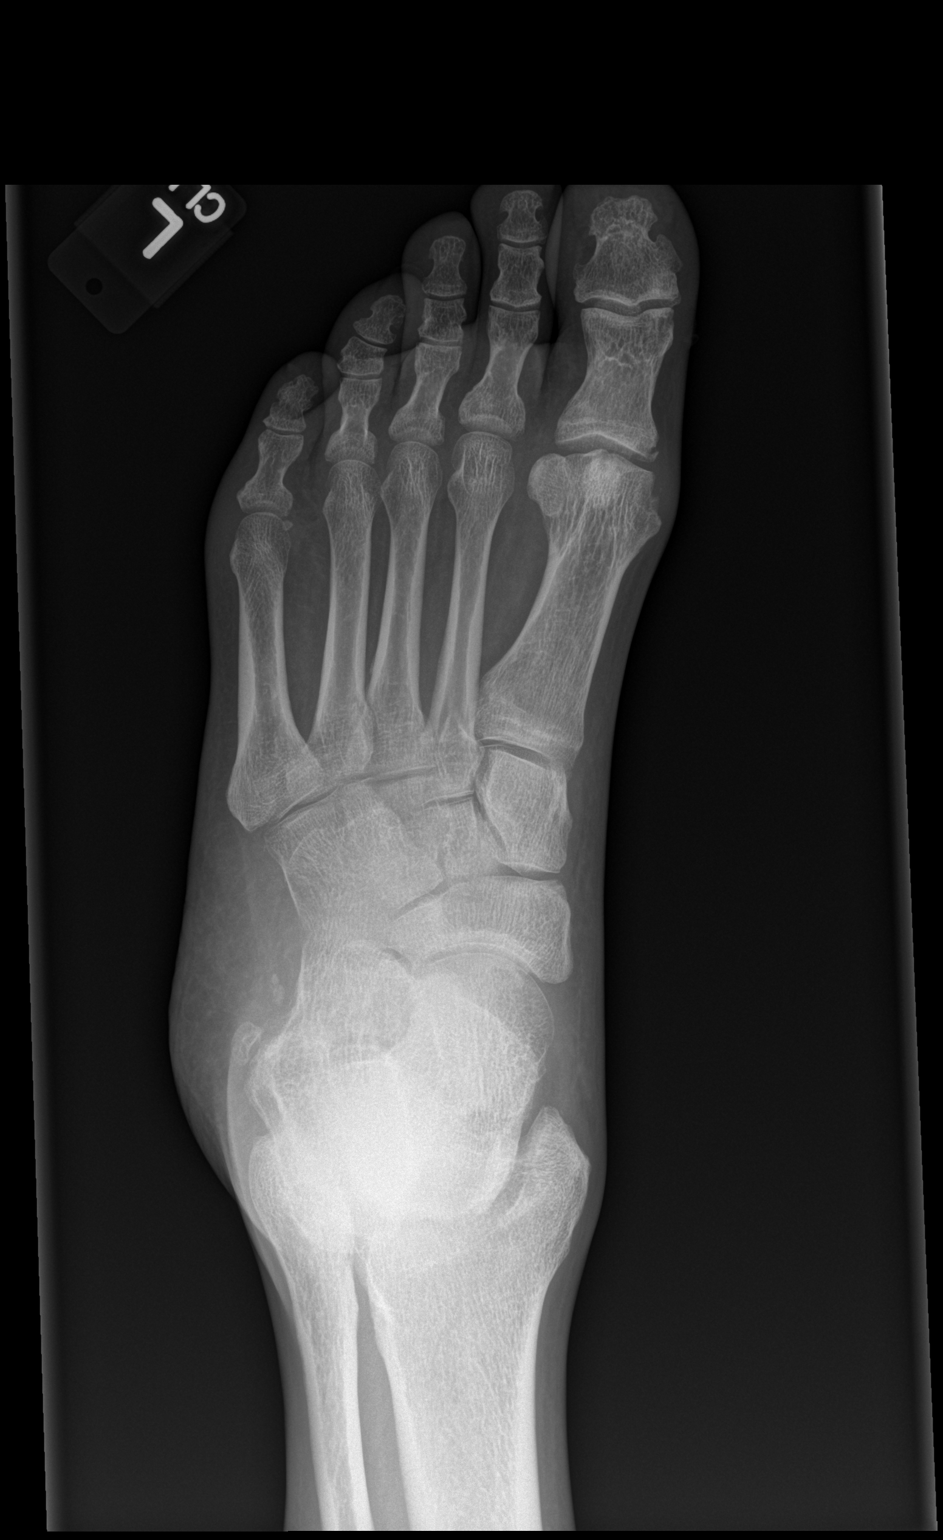

[x foot lat left]
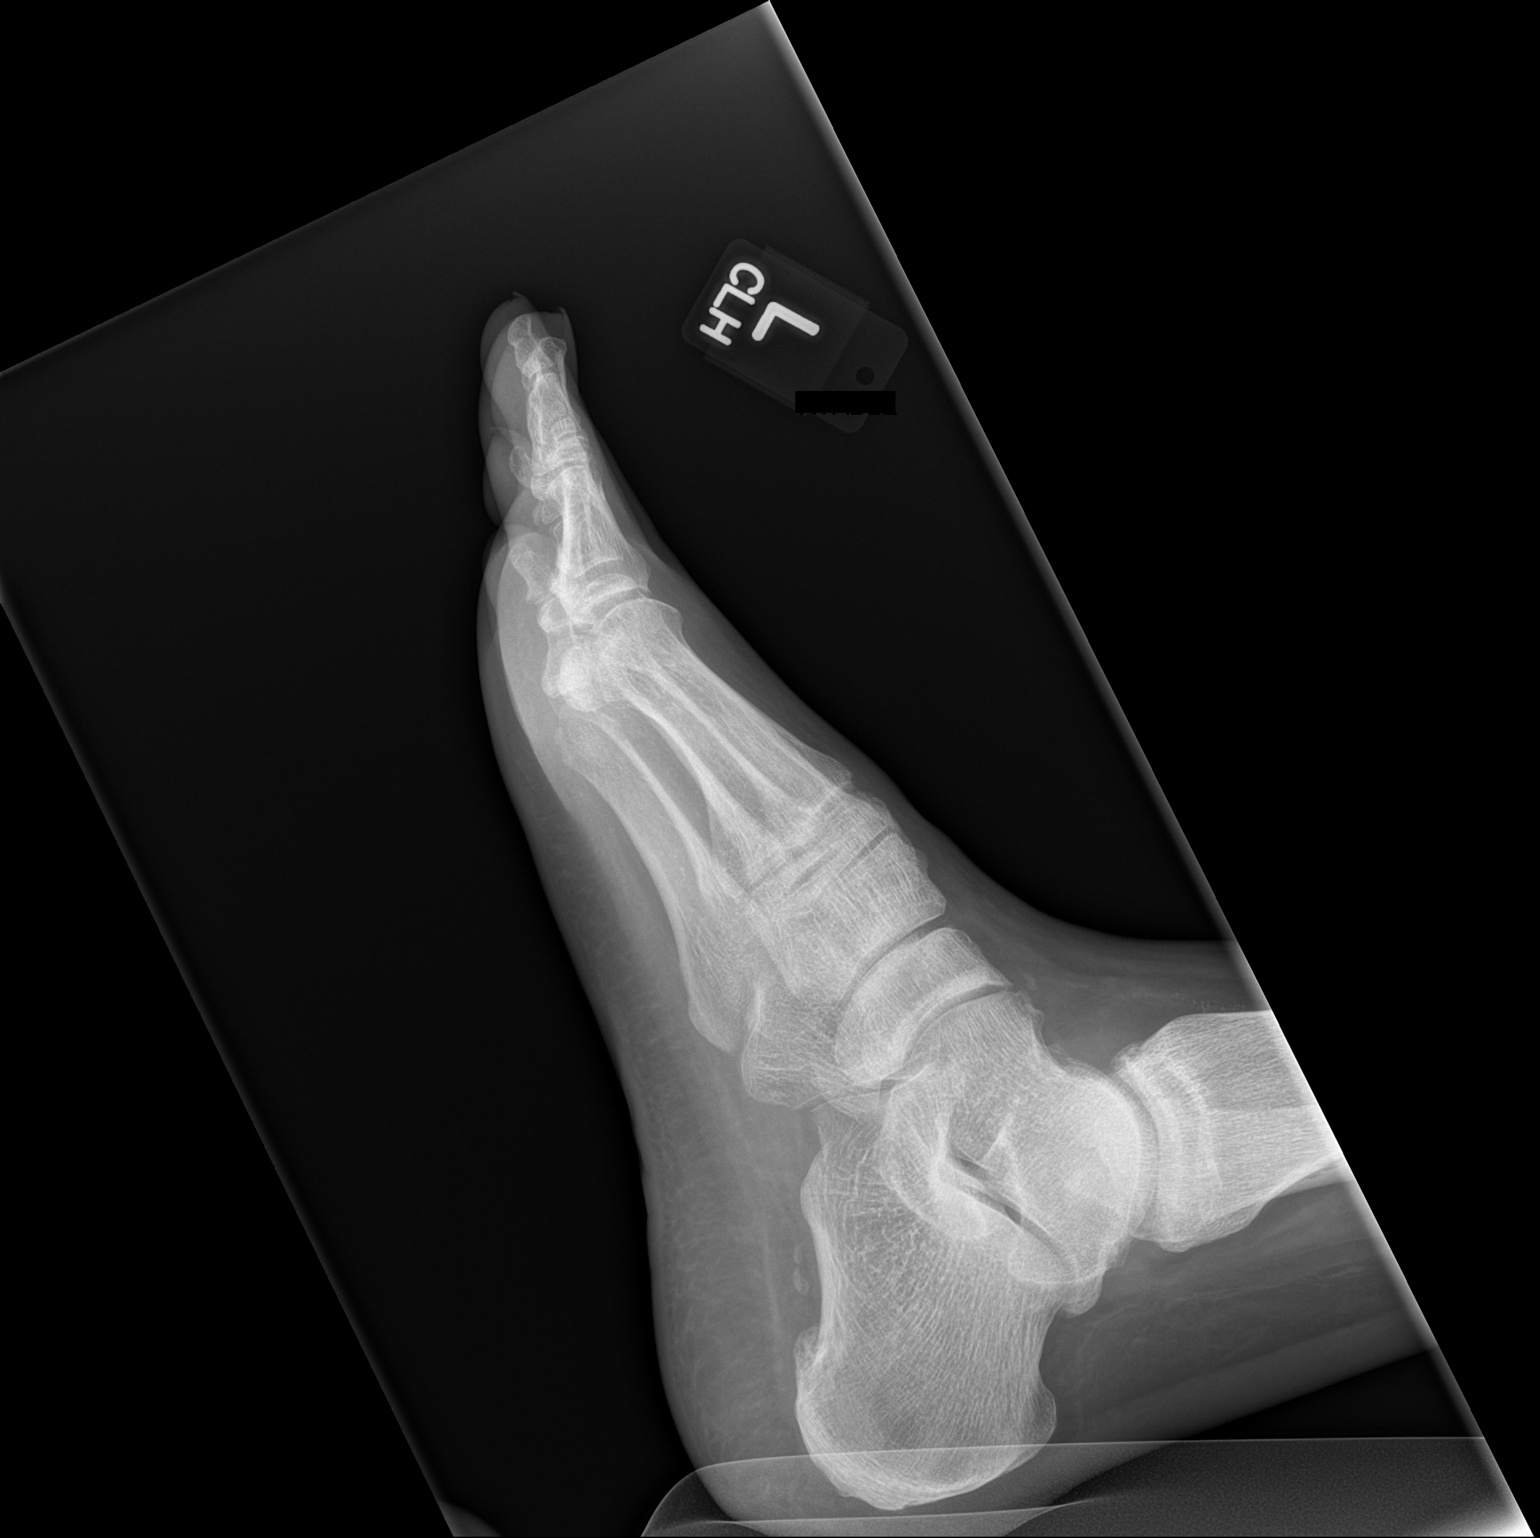

[2 of 2 positions shown; findings below may reference images not displayed]

FINDINGS: There is a small, intra-articular fracture involving the medial of
base of the first proximal phalanx. There is no evidence of
arthropathy or other focal bone abnormality. Soft tissues are
unremarkable.
IMPRESSION: Small intra-articular fracture involves the base of the first
proximal phalanx.

## 2014-07-09 IMAGING — CR DG ANKLE COMPLETE 3+V*L*
3 series · 3 of 3 positions shown · non-contrast
Comparison: None.

CLINICAL DATA: Leg pain.  Numbness in foot

EXAM:
LEFT ANKLE COMPLETE - 3+ VIEW

[x ankle ap left]
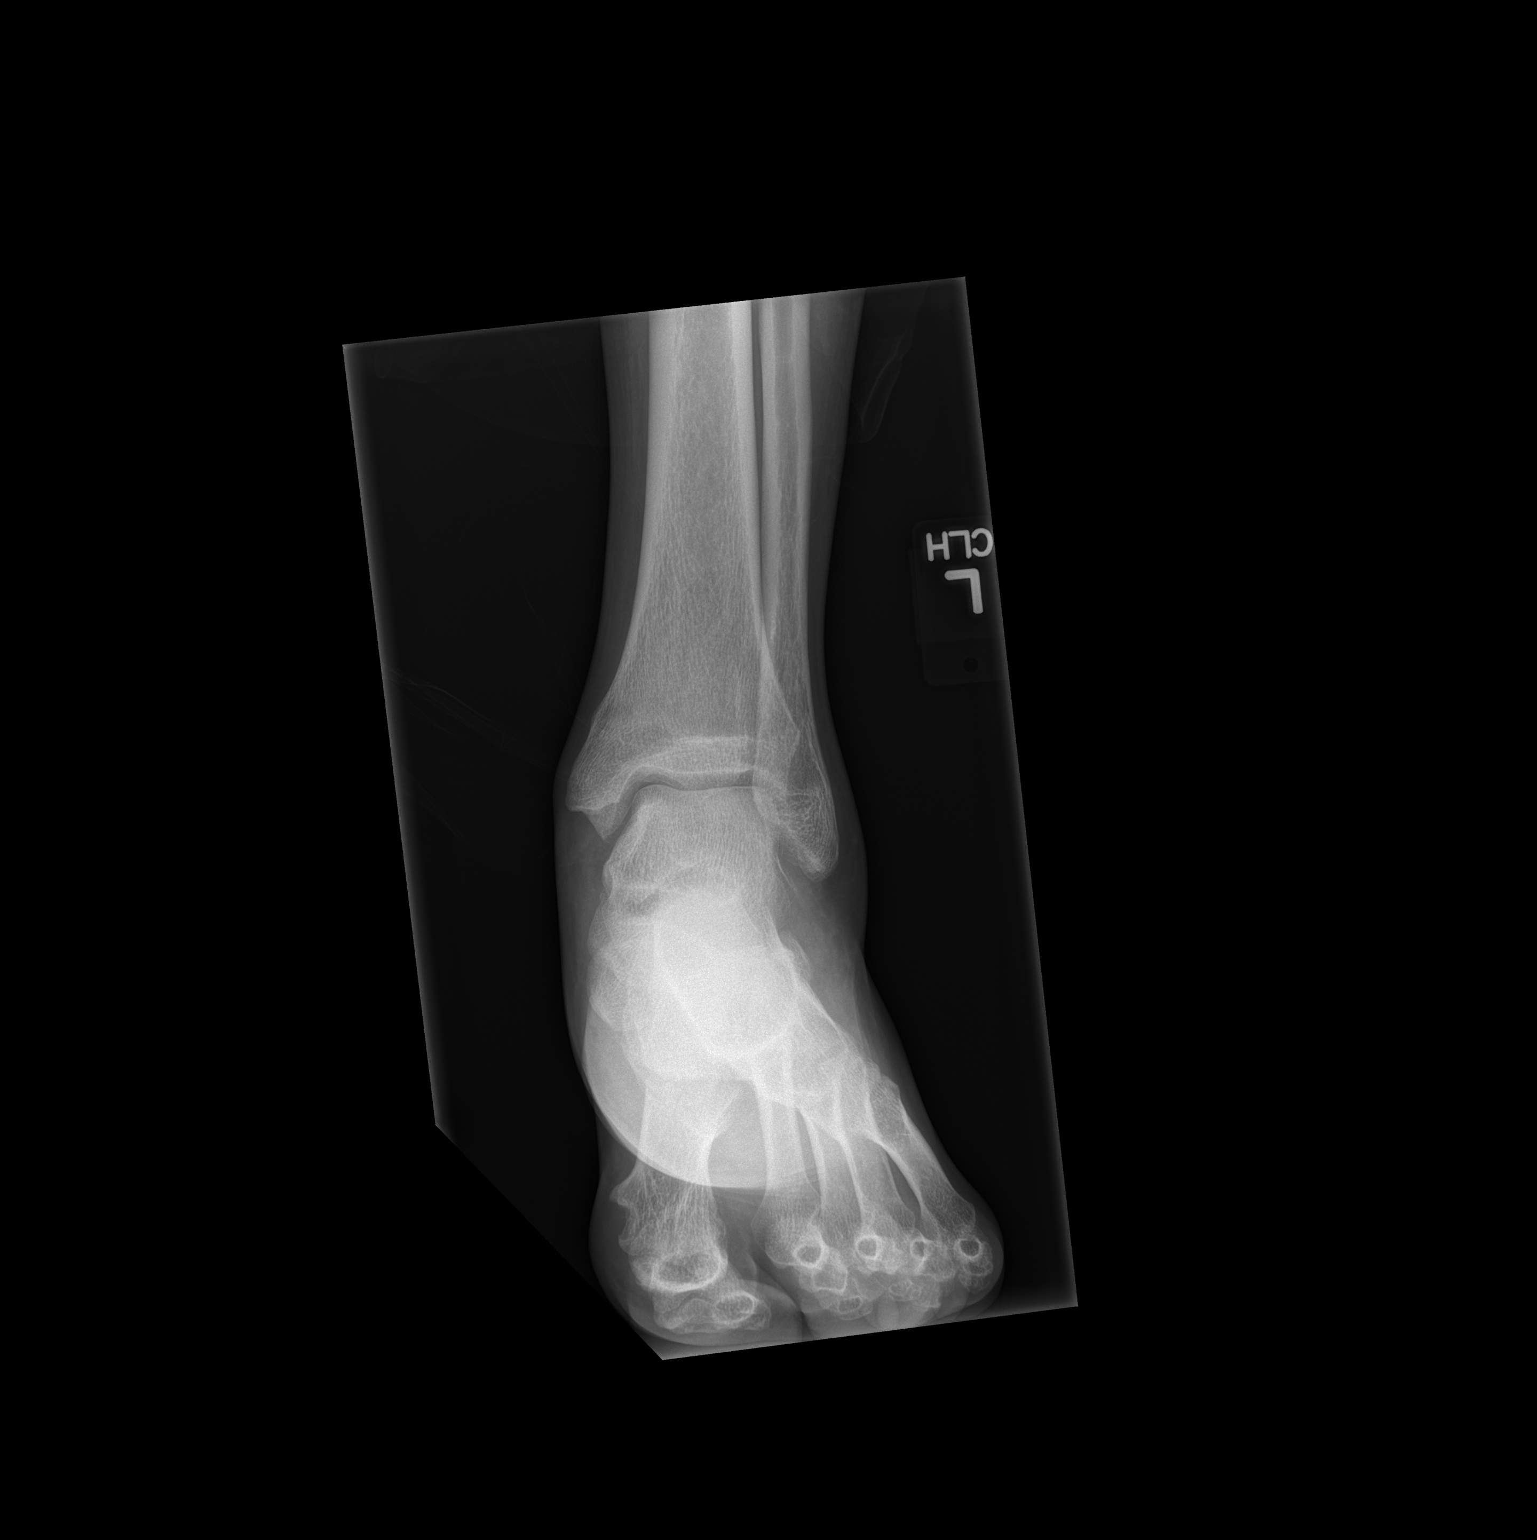

[x ankle obl left]
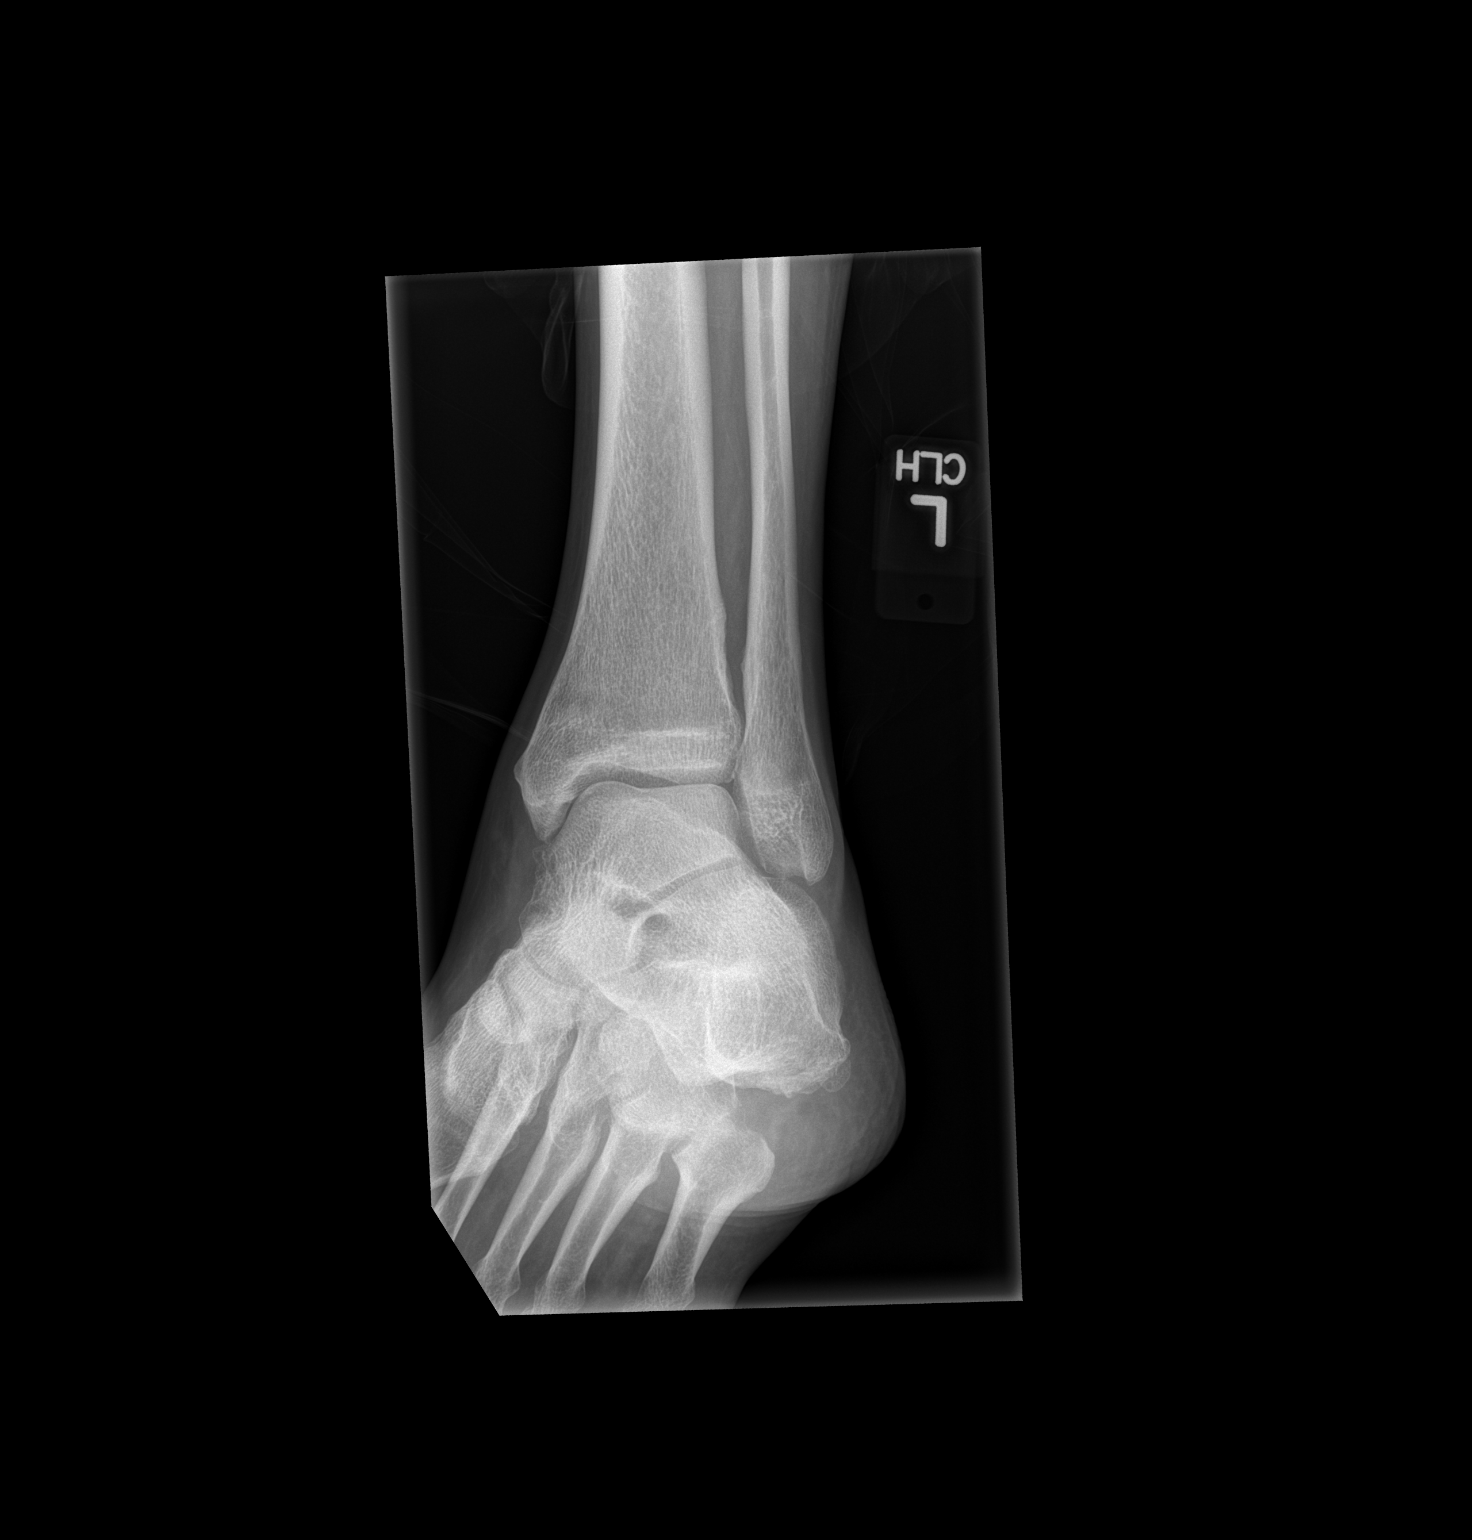

[x ankle lat left]
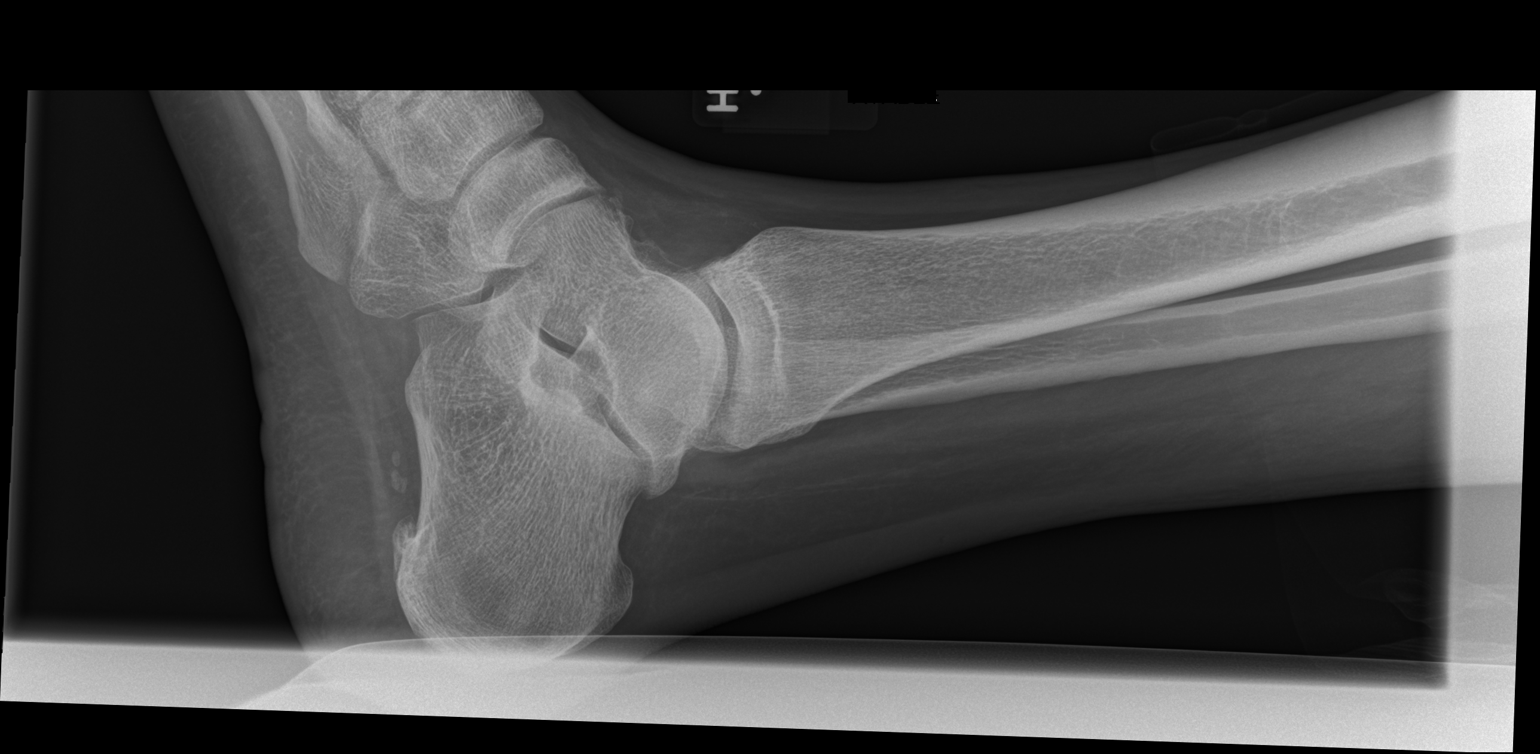

[3 of 3 positions shown; findings below may reference images not displayed]

FINDINGS: There is no evidence of fracture, dislocation, or joint effusion.
Plantar heel spur are noted. Soft tissues are unremarkable.
IMPRESSION: 1. No acute findings.
2. Plantar heel spur.

## 2014-07-09 IMAGING — CR DG FEMUR 2V*L*
6 series · 6 of 6 positions shown · non-contrast
Comparison: None.

CLINICAL DATA: Leg pain.  Fall.

EXAM:
LEFT FEMUR - 2 VIEW

[t femur proximal ap left]
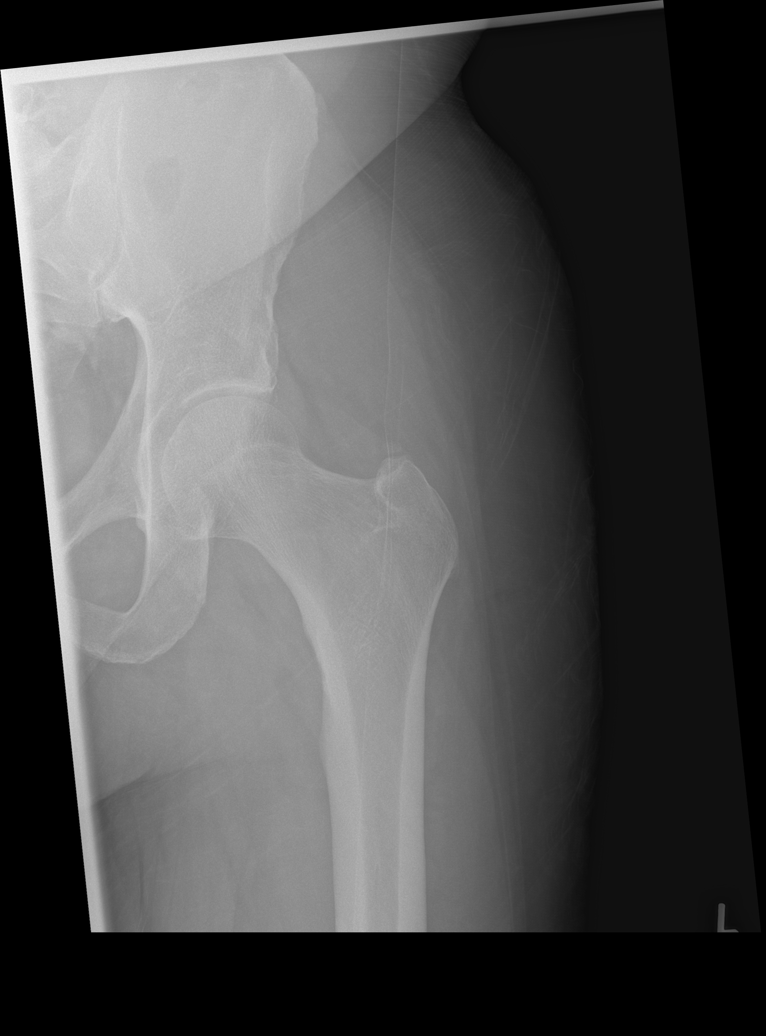

[t femur distal ap left]
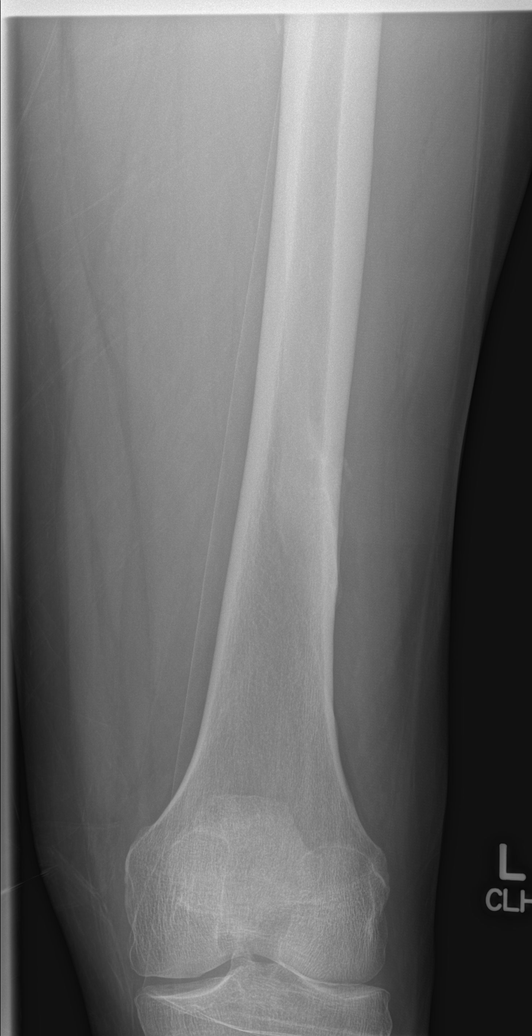

[x femur distal lat left]
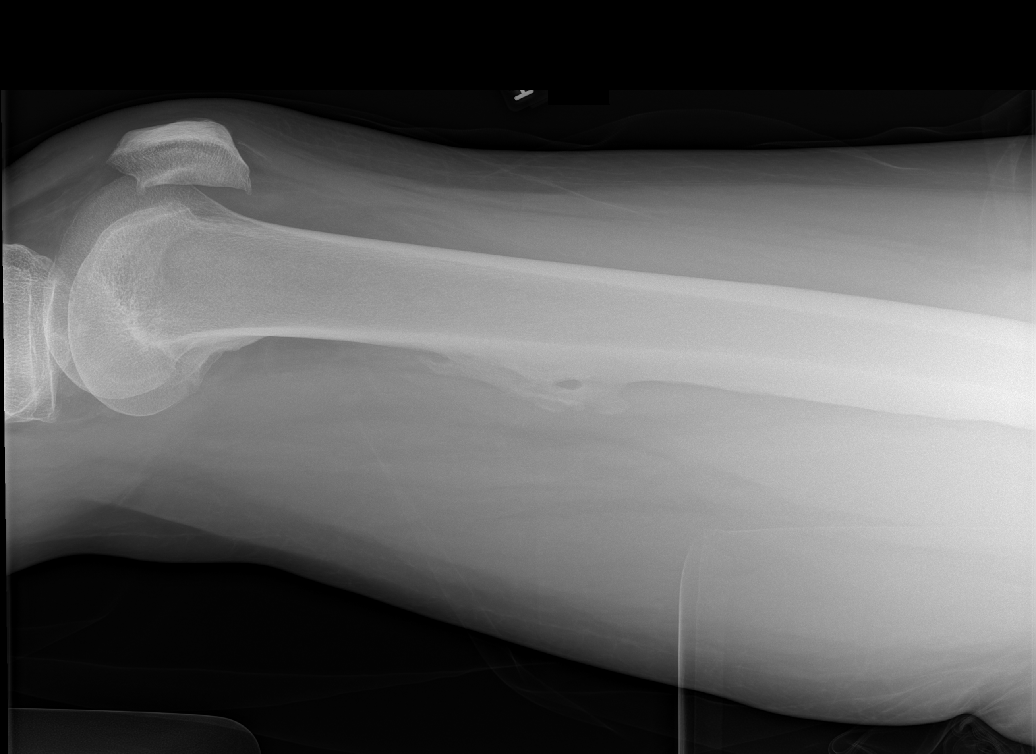

[x hip lat left (1 of 2)]
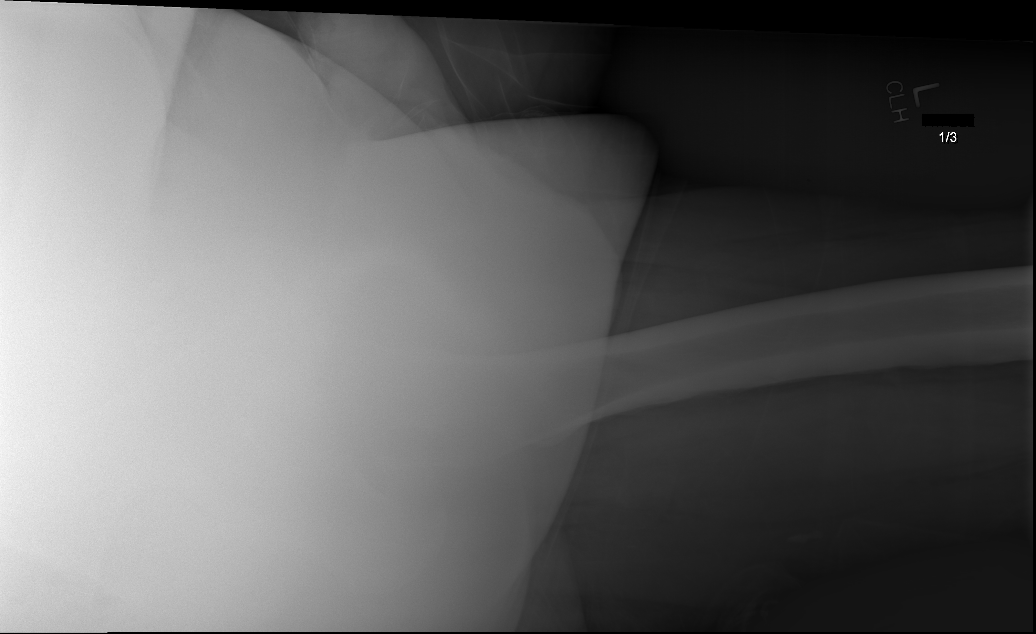

[x hip lat left (2 of 2)]
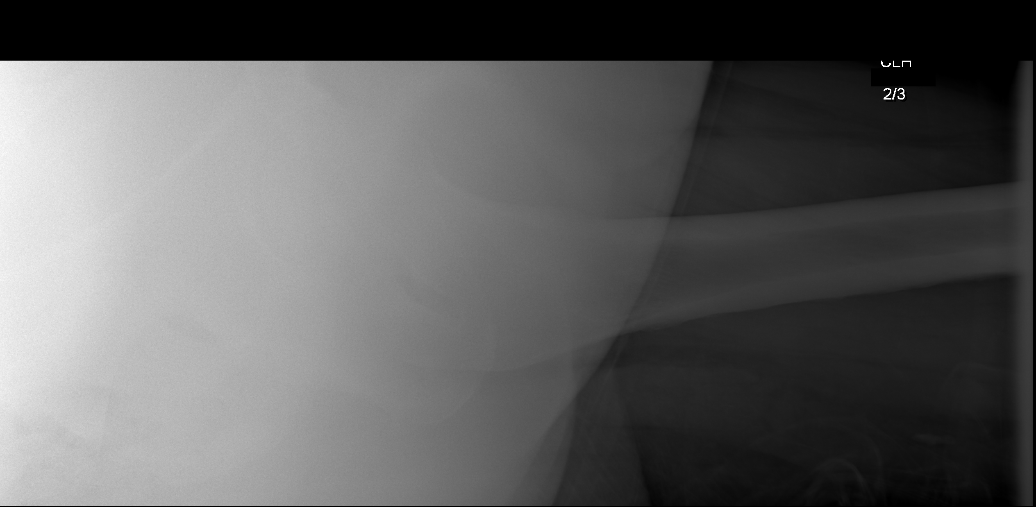

[w hip lat left]
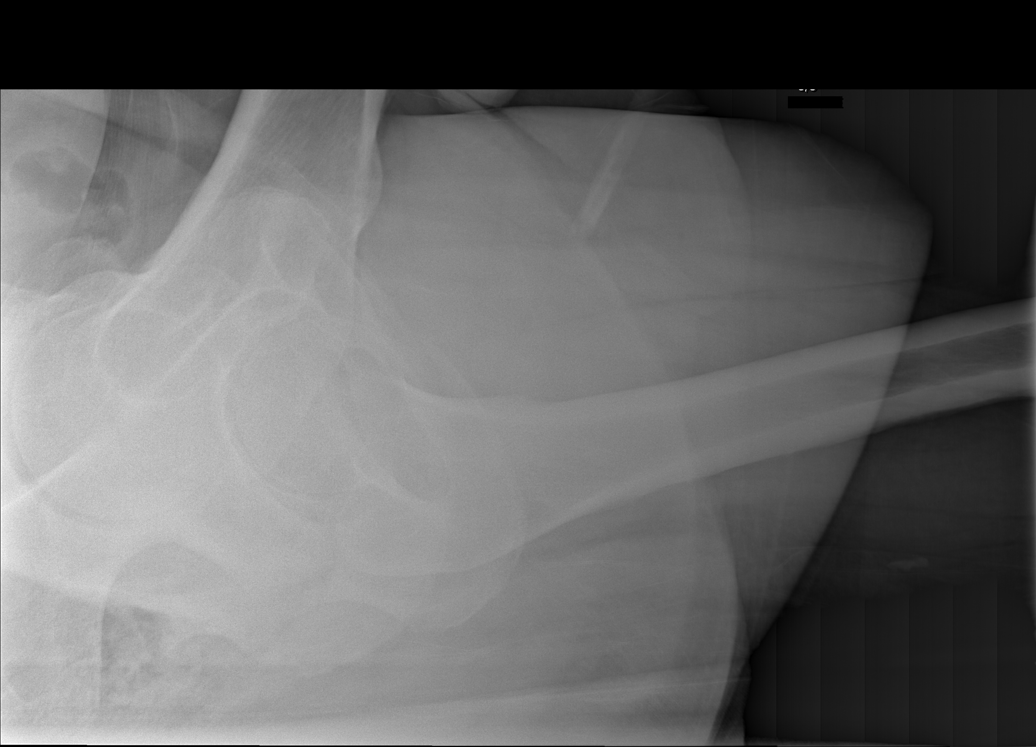

[6 of 6 positions shown; findings below may reference images not displayed]

FINDINGS: There is no evidence of fracture or other focal bone lesions.
Nonaggressive appearing exostosis is noted arising from the
posterior cortex of the distal diaphysis of the left femur. Soft
tissues are unremarkable.
IMPRESSION: 1. No acute findings.
2. Exostosis.

## 2014-07-09 IMAGING — CR DG TIBIA/FIBULA 2V*L*
2 series · 2 of 2 positions shown · non-contrast
Comparison: None.

CLINICAL DATA: Leg pain

EXAM:
LEFT TIBIA AND FIBULA - 2 VIEW

[x tib-fib ap left]
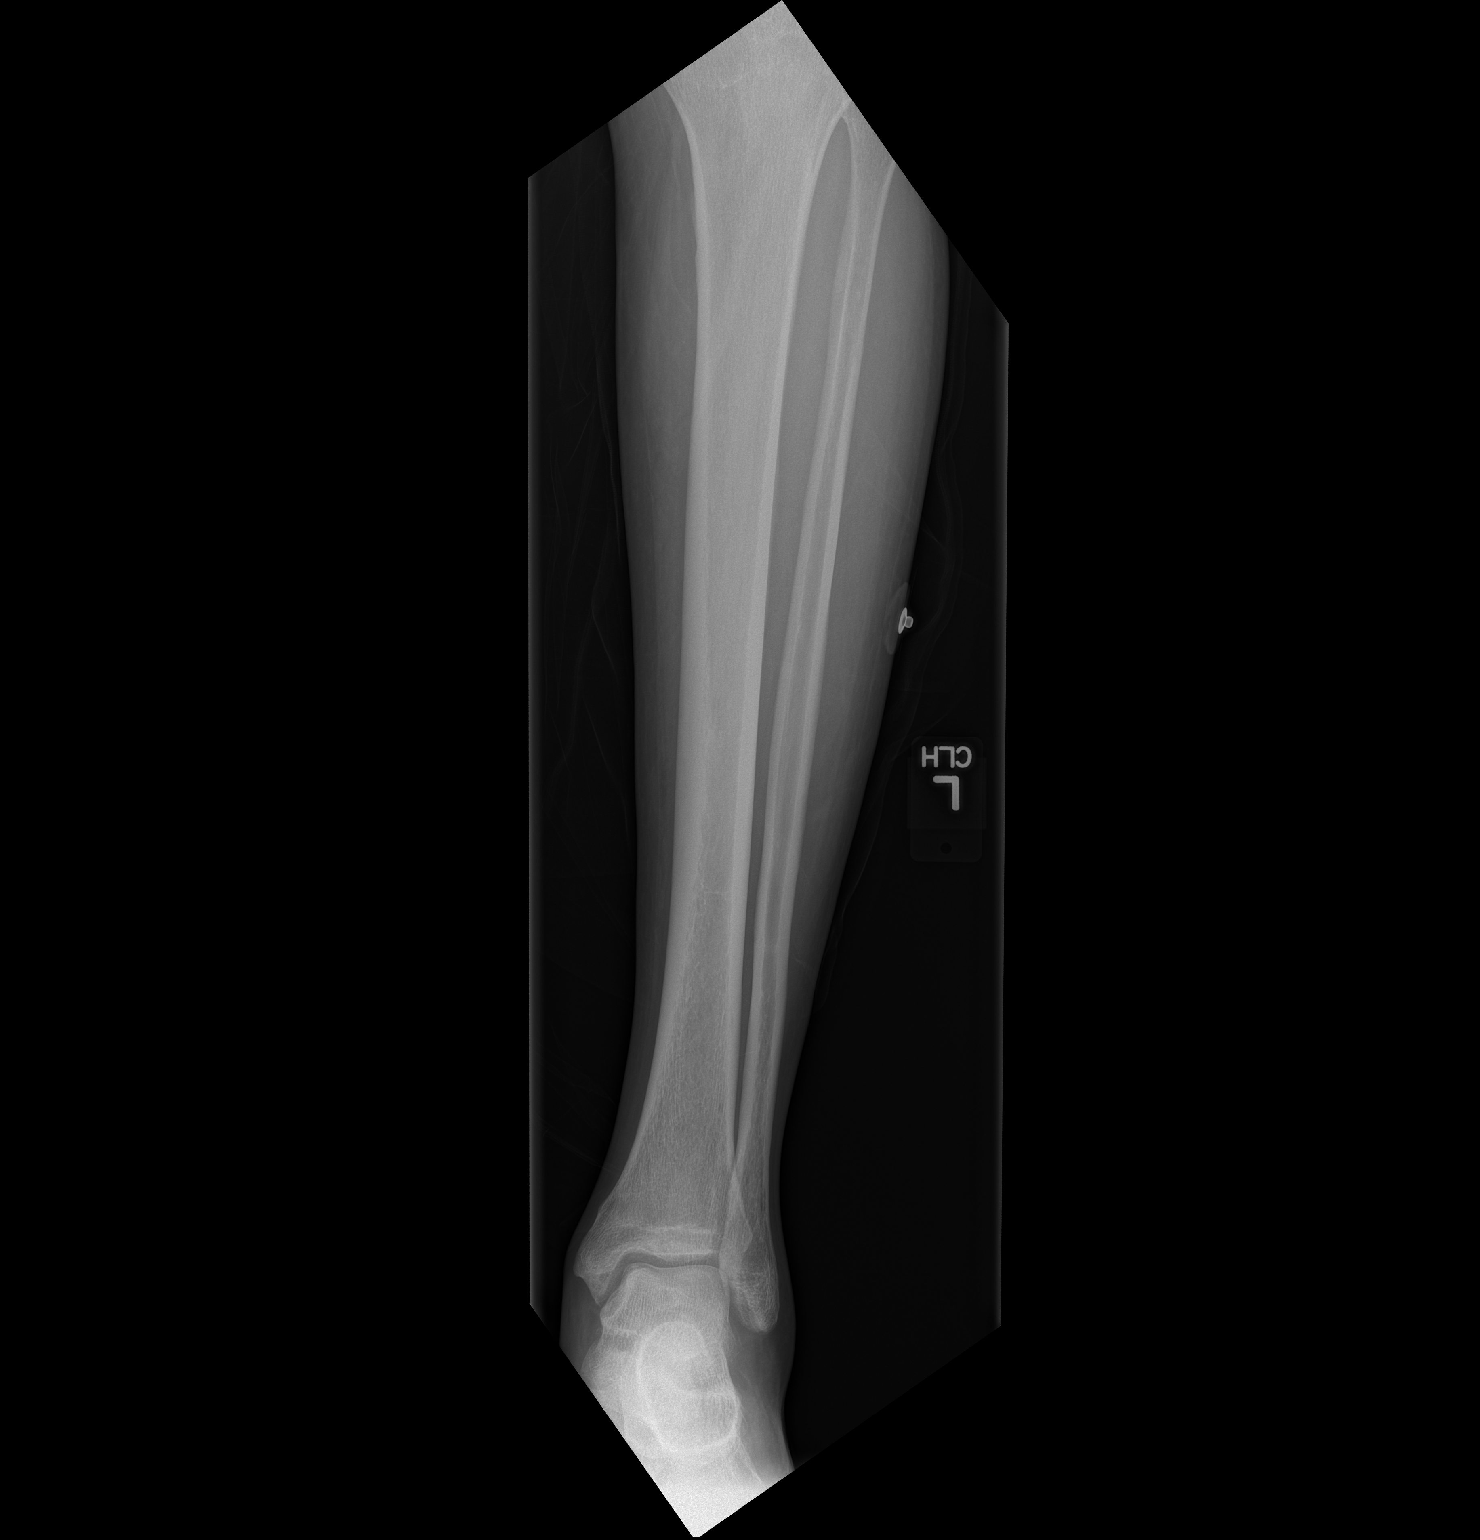

[x tib-fib lat left]
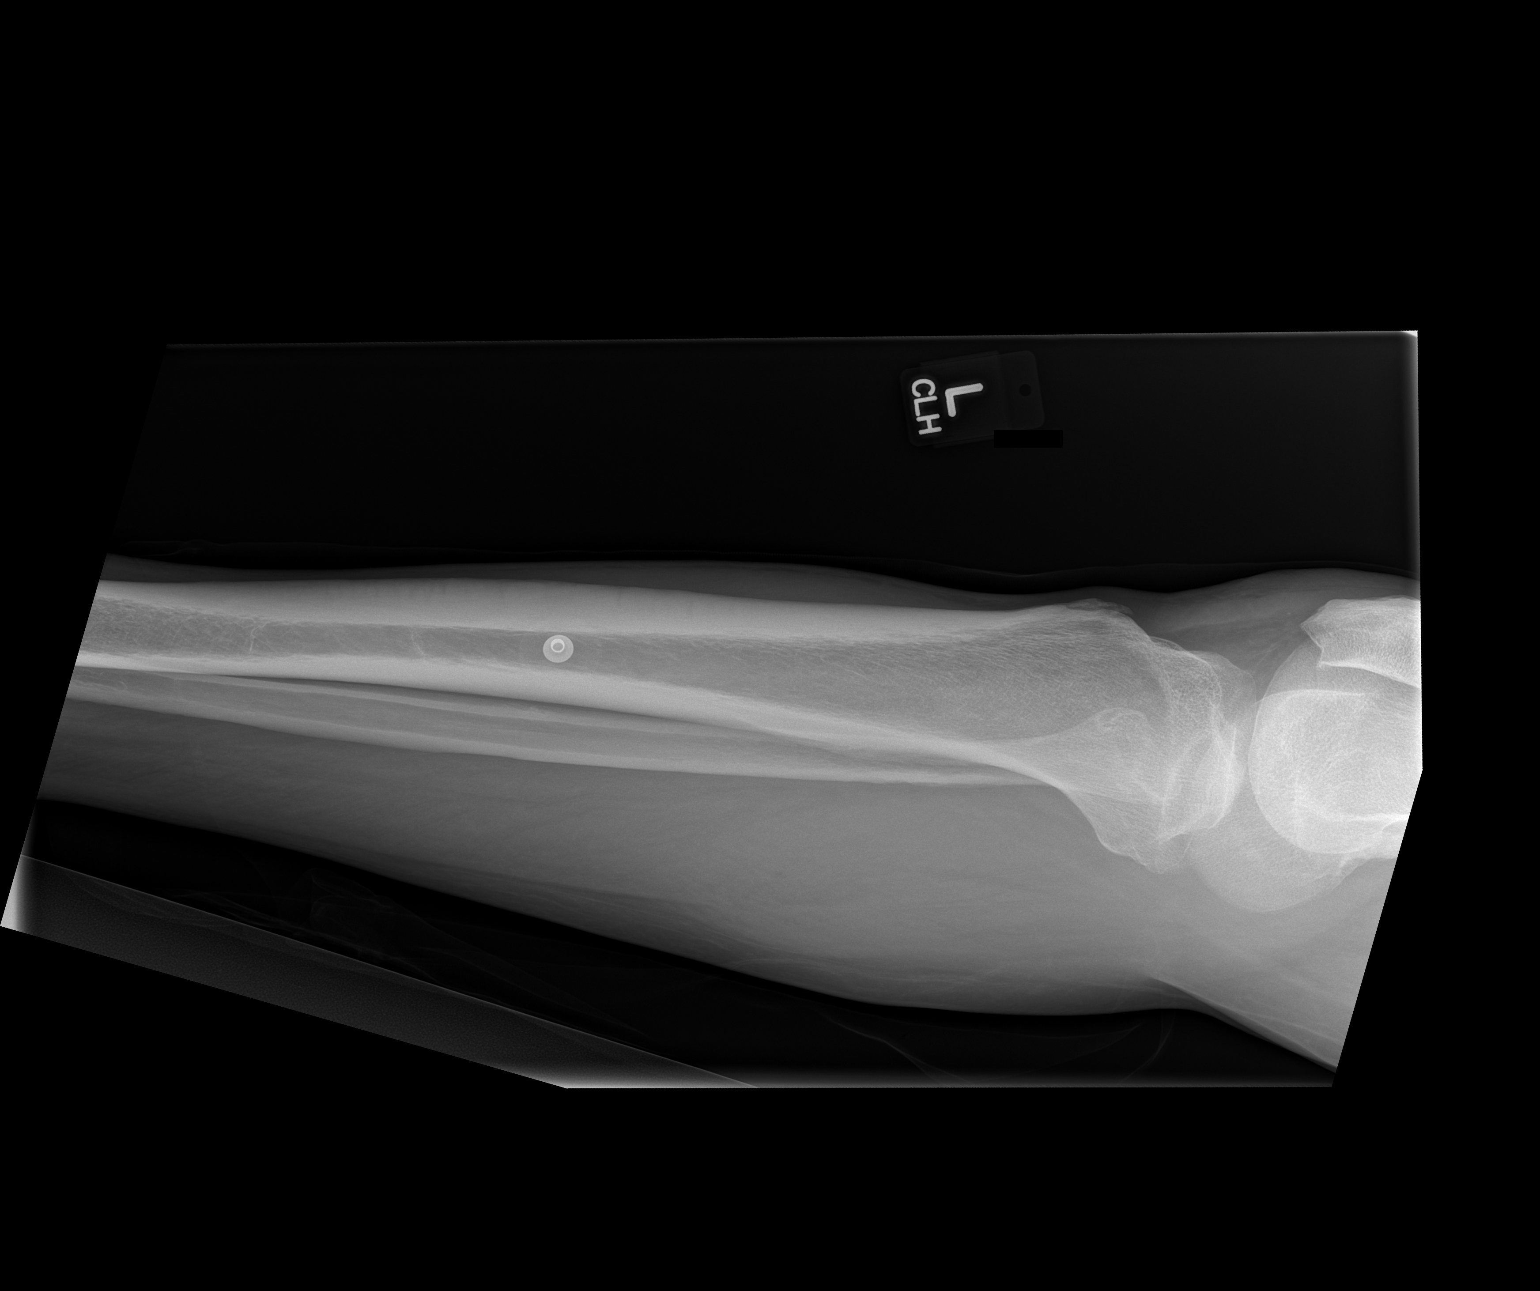

[2 of 2 positions shown; findings below may reference images not displayed]

FINDINGS: There is no evidence of fracture or other focal bone lesions. Soft
tissues are unremarkable.
IMPRESSION: Negative.

## 2014-07-16 DIAGNOSIS — I509 Heart failure, unspecified: Secondary | ICD-10-CM | POA: Diagnosis not present

## 2014-07-16 DIAGNOSIS — R0602 Shortness of breath: Secondary | ICD-10-CM | POA: Diagnosis not present

## 2014-07-22 ENCOUNTER — Other Ambulatory Visit: Payer: Self-pay | Admitting: Family

## 2014-07-23 ENCOUNTER — Encounter: Payer: Self-pay | Admitting: Internal Medicine

## 2014-07-23 DIAGNOSIS — R0602 Shortness of breath: Secondary | ICD-10-CM | POA: Diagnosis not present

## 2014-07-23 DIAGNOSIS — I509 Heart failure, unspecified: Secondary | ICD-10-CM | POA: Diagnosis not present

## 2014-07-23 DIAGNOSIS — I5023 Acute on chronic systolic (congestive) heart failure: Secondary | ICD-10-CM | POA: Diagnosis not present

## 2014-07-27 ENCOUNTER — Telehealth (HOSPITAL_COMMUNITY): Payer: Self-pay | Admitting: Cardiology

## 2014-07-27 ENCOUNTER — Observation Stay (HOSPITAL_COMMUNITY)
Admission: EM | Admit: 2014-07-27 | Discharge: 2014-07-28 | Disposition: A | Discharge status: Home / Self Care | Payer: Medicare Other | Attending: Internal Medicine | Admitting: Internal Medicine

## 2014-07-27 ENCOUNTER — Emergency Department (HOSPITAL_COMMUNITY): Payer: Medicare Other

## 2014-07-27 ENCOUNTER — Encounter (HOSPITAL_COMMUNITY): Payer: Self-pay | Admitting: Emergency Medicine

## 2014-07-27 DIAGNOSIS — G629 Polyneuropathy, unspecified: Secondary | ICD-10-CM | POA: Insufficient documentation

## 2014-07-27 DIAGNOSIS — I509 Heart failure, unspecified: Secondary | ICD-10-CM | POA: Diagnosis not present

## 2014-07-27 DIAGNOSIS — K219 Gastro-esophageal reflux disease without esophagitis: Secondary | ICD-10-CM | POA: Insufficient documentation

## 2014-07-27 DIAGNOSIS — Z5181 Encounter for therapeutic drug level monitoring: Secondary | ICD-10-CM | POA: Diagnosis not present

## 2014-07-27 DIAGNOSIS — R002 Palpitations: Secondary | ICD-10-CM | POA: Diagnosis present

## 2014-07-27 DIAGNOSIS — I129 Hypertensive chronic kidney disease with stage 1 through stage 4 chronic kidney disease, or unspecified chronic kidney disease: Secondary | ICD-10-CM | POA: Diagnosis not present

## 2014-07-27 DIAGNOSIS — Z452 Encounter for adjustment and management of vascular access device: Secondary | ICD-10-CM | POA: Diagnosis not present

## 2014-07-27 DIAGNOSIS — Z9981 Dependence on supplemental oxygen: Secondary | ICD-10-CM | POA: Diagnosis not present

## 2014-07-27 DIAGNOSIS — I25119 Atherosclerotic heart disease of native coronary artery with unspecified angina pectoris: Secondary | ICD-10-CM | POA: Insufficient documentation

## 2014-07-27 DIAGNOSIS — R072 Precordial pain: Secondary | ICD-10-CM | POA: Diagnosis not present

## 2014-07-27 DIAGNOSIS — I4891 Unspecified atrial fibrillation: Secondary | ICD-10-CM | POA: Insufficient documentation

## 2014-07-27 DIAGNOSIS — I252 Old myocardial infarction: Secondary | ICD-10-CM | POA: Insufficient documentation

## 2014-07-27 DIAGNOSIS — R0602 Shortness of breath: Secondary | ICD-10-CM | POA: Diagnosis not present

## 2014-07-27 DIAGNOSIS — R0789 Other chest pain: Principal | ICD-10-CM | POA: Diagnosis present

## 2014-07-27 DIAGNOSIS — I5023 Acute on chronic systolic (congestive) heart failure: Secondary | ICD-10-CM | POA: Insufficient documentation

## 2014-07-27 DIAGNOSIS — Z7902 Long term (current) use of antithrombotics/antiplatelets: Secondary | ICD-10-CM | POA: Insufficient documentation

## 2014-07-27 DIAGNOSIS — N184 Chronic kidney disease, stage 4 (severe): Secondary | ICD-10-CM | POA: Diagnosis not present

## 2014-07-27 DIAGNOSIS — D869 Sarcoidosis, unspecified: Secondary | ICD-10-CM | POA: Insufficient documentation

## 2014-07-27 DIAGNOSIS — R079 Chest pain, unspecified: Secondary | ICD-10-CM | POA: Diagnosis not present

## 2014-07-27 DIAGNOSIS — Z950 Presence of cardiac pacemaker: Secondary | ICD-10-CM | POA: Diagnosis not present

## 2014-07-27 DIAGNOSIS — E780 Pure hypercholesterolemia: Secondary | ICD-10-CM | POA: Insufficient documentation

## 2014-07-27 DIAGNOSIS — I472 Ventricular tachycardia: Secondary | ICD-10-CM | POA: Diagnosis not present

## 2014-07-27 DIAGNOSIS — M109 Gout, unspecified: Secondary | ICD-10-CM | POA: Insufficient documentation

## 2014-07-27 DIAGNOSIS — I5022 Chronic systolic (congestive) heart failure: Secondary | ICD-10-CM | POA: Diagnosis present

## 2014-07-27 DIAGNOSIS — N183 Chronic kidney disease, stage 3 (moderate): Secondary | ICD-10-CM | POA: Insufficient documentation

## 2014-07-27 DIAGNOSIS — I429 Cardiomyopathy, unspecified: Secondary | ICD-10-CM | POA: Diagnosis not present

## 2014-07-27 DIAGNOSIS — R51 Headache: Secondary | ICD-10-CM | POA: Diagnosis not present

## 2014-07-27 DIAGNOSIS — R42 Dizziness and giddiness: Secondary | ICD-10-CM | POA: Diagnosis not present

## 2014-07-27 DIAGNOSIS — Z79899 Other long term (current) drug therapy: Secondary | ICD-10-CM | POA: Diagnosis not present

## 2014-07-27 LAB — PROTIME-INR
INR: 1.05 (ref 0.00–1.49)
PROTHROMBIN TIME: 13.9 s (ref 11.6–15.2)

## 2014-07-27 LAB — CBC
HEMATOCRIT: 43.5 % (ref 39.0–52.0)
HEMOGLOBIN: 15.3 g/dL (ref 13.0–17.0)
MCH: 31.4 pg (ref 26.0–34.0)
MCHC: 35.2 g/dL (ref 30.0–36.0)
MCV: 89.1 fL (ref 78.0–100.0)
Platelets: 166 10*3/uL (ref 150–400)
RBC: 4.88 MIL/uL (ref 4.22–5.81)
RDW: 13.6 % (ref 11.5–15.5)
WBC: 7.4 10*3/uL (ref 4.0–10.5)

## 2014-07-27 LAB — BRAIN NATRIURETIC PEPTIDE: B Natriuretic Peptide: 407.5 pg/mL — ABNORMAL HIGH (ref 0.0–100.0)

## 2014-07-27 LAB — URINALYSIS, ROUTINE W REFLEX MICROSCOPIC
Bilirubin Urine: NEGATIVE
Glucose, UA: NEGATIVE mg/dL
Hgb urine dipstick: NEGATIVE
KETONES UR: NEGATIVE mg/dL
LEUKOCYTES UA: NEGATIVE
Nitrite: NEGATIVE
Protein, ur: >300 mg/dL — AB
Specific Gravity, Urine: 1.023 (ref 1.005–1.030)
Urobilinogen, UA: 1 mg/dL (ref 0.0–1.0)
pH: 5.5 (ref 5.0–8.0)

## 2014-07-27 LAB — BASIC METABOLIC PANEL
ANION GAP: 10 (ref 5–15)
BUN: 31 mg/dL — AB (ref 6–20)
CO2: 24 mmol/L (ref 22–32)
CREATININE: 1.69 mg/dL — AB (ref 0.61–1.24)
Calcium: 9.5 mg/dL (ref 8.9–10.3)
Chloride: 101 mmol/L (ref 101–111)
GFR calc Af Amer: 48 mL/min — ABNORMAL LOW (ref >60)
GFR, EST NON AFRICAN AMERICAN: 41 mL/min — AB (ref >60)
GLUCOSE: 156 mg/dL — AB (ref 65–99)
POTASSIUM: 3.3 mmol/L — AB (ref 3.5–5.1)
Sodium: 135 mmol/L (ref 135–145)

## 2014-07-27 LAB — I-STAT TROPONIN, ED: TROPONIN I, POC: 0.08 ng/mL (ref 0.00–0.08)

## 2014-07-27 LAB — URINE MICROSCOPIC-ADD ON

## 2014-07-27 LAB — TROPONIN I: TROPONIN I: 0.07 ng/mL — AB (ref <0.031)

## 2014-07-27 MED ORDER — SODIUM CHLORIDE 0.9 % IV BOLUS (SEPSIS)
250.0000 mL | Freq: Once | INTRAVENOUS | Status: AC
  Administered 2014-07-27: 250 mL via INTRAVENOUS

## 2014-07-27 MED ORDER — METOLAZONE 2.5 MG PO TABS: 2.5000 mg | ORAL_TABLET | Freq: Every day | ORAL | Status: DC | PRN

## 2014-07-27 MED ORDER — FLUTICASONE PROPIONATE 50 MCG/ACT NA SUSP
2.0000 | Freq: Every day | NASAL | Status: DC | PRN
  Filled 2014-07-27: qty 16

## 2014-07-27 MED ORDER — SPIRONOLACTONE 12.5 MG HALF TABLET: 12.5000 mg | ORAL_TABLET | ORAL | Status: DC

## 2014-07-27 MED ORDER — SUCRALFATE 1 GM/10ML PO SUSP: 1.0000 g | Freq: Four times a day (QID) | ORAL | Status: DC | PRN

## 2014-07-27 MED ORDER — GABAPENTIN ENACARBIL ER 600 MG PO TBCR: 600.0000 mg | EXTENDED_RELEASE_TABLET | Freq: Every day | ORAL | Status: DC

## 2014-07-27 MED ORDER — OXYCODONE HCL 5 MG PO TABS
20.0000 mg | ORAL_TABLET | Freq: Three times a day (TID) | ORAL | Status: DC | PRN
  Administered 2014-07-28: 20 mg via ORAL
  Filled 2014-07-27: qty 4

## 2014-07-27 MED ORDER — GABAPENTIN 600 MG PO TABS
600.0000 mg | ORAL_TABLET | Freq: Every day | ORAL | Status: DC
  Administered 2014-07-28: 600 mg via ORAL
  Filled 2014-07-27: qty 1

## 2014-07-27 MED ORDER — ONDANSETRON HCL 4 MG/2ML IJ SOLN: 4.0000 mg | Freq: Four times a day (QID) | INTRAMUSCULAR | Status: DC | PRN

## 2014-07-27 MED ORDER — FENTANYL CITRATE (PF) 100 MCG/2ML IJ SOLN
50.0000 ug | Freq: Once | INTRAMUSCULAR | Status: AC
  Administered 2014-07-27: 50 ug via INTRAVENOUS
  Filled 2014-07-27: qty 2

## 2014-07-27 MED ORDER — CARVEDILOL 6.25 MG PO TABS
6.2500 mg | ORAL_TABLET | Freq: Two times a day (BID) | ORAL | Status: DC
  Administered 2014-07-28: 6.25 mg via ORAL
  Filled 2014-07-27 (×2): qty 1

## 2014-07-27 MED ORDER — POTASSIUM CHLORIDE CRYS ER 20 MEQ PO TBCR: 40.0000 meq | EXTENDED_RELEASE_TABLET | Freq: Every day | ORAL | Status: DC

## 2014-07-27 MED ORDER — PANTOPRAZOLE SODIUM 40 MG PO TBEC
80.0000 mg | DELAYED_RELEASE_TABLET | Freq: Every day | ORAL | Status: DC
  Administered 2014-07-28: 80 mg via ORAL
  Filled 2014-07-27: qty 2

## 2014-07-27 MED ORDER — AMIODARONE HCL 200 MG PO TABS
200.0000 mg | ORAL_TABLET | Freq: Two times a day (BID) | ORAL | Status: DC
  Administered 2014-07-28 (×2): 200 mg via ORAL
  Filled 2014-07-27 (×2): qty 1

## 2014-07-27 MED ORDER — TORSEMIDE 20 MG PO TABS: 40.0000 mg | ORAL_TABLET | Freq: Two times a day (BID) | ORAL | Status: DC

## 2014-07-27 MED ORDER — METOCLOPRAMIDE HCL 5 MG/ML IJ SOLN
5.0000 mg | Freq: Once | INTRAMUSCULAR | Status: AC
  Administered 2014-07-27: 5 mg via INTRAVENOUS
  Filled 2014-07-27: qty 2

## 2014-07-27 MED ORDER — DIPHENHYDRAMINE HCL 50 MG/ML IJ SOLN
12.5000 mg | Freq: Once | INTRAMUSCULAR | Status: AC
  Administered 2014-07-27: 12.5 mg via INTRAVENOUS
  Filled 2014-07-27: qty 1

## 2014-07-27 MED ORDER — ACETAMINOPHEN 325 MG PO TABS: 650.0000 mg | ORAL_TABLET | ORAL | Status: DC | PRN

## 2014-07-27 MED ORDER — ALPRAZOLAM 0.5 MG PO TABS: 0.5000 mg | ORAL_TABLET | Freq: Three times a day (TID) | ORAL | Status: DC | PRN

## 2014-07-27 MED ORDER — APIXABAN 5 MG PO TABS
5.0000 mg | ORAL_TABLET | Freq: Two times a day (BID) | ORAL | Status: DC
  Administered 2014-07-28 (×2): 5 mg via ORAL
  Filled 2014-07-27 (×2): qty 1

## 2014-07-27 MED ORDER — ACETAMINOPHEN 325 MG PO TABS
650.0000 mg | ORAL_TABLET | Freq: Once | ORAL | Status: DC
  Filled 2014-07-27: qty 2

## 2014-07-27 MED ORDER — DIPHENHYDRAMINE HCL 25 MG PO CAPS: 50.0000 mg | ORAL_CAPSULE | Freq: Every day | ORAL | Status: DC | PRN

## 2014-07-27 MED ORDER — SENNOSIDES-DOCUSATE SODIUM 8.6-50 MG PO TABS: 1.0000 | ORAL_TABLET | Freq: Two times a day (BID) | ORAL | Status: DC | PRN

## 2014-07-27 MED ORDER — ALLOPURINOL 100 MG PO TABS
100.0000 mg | ORAL_TABLET | Freq: Every day | ORAL | Status: DC
  Administered 2014-07-28 (×2): 100 mg via ORAL
  Filled 2014-07-27 (×2): qty 1

## 2014-07-27 MED ORDER — SERTRALINE HCL 100 MG PO TABS
100.0000 mg | ORAL_TABLET | Freq: Every day | ORAL | Status: DC
  Administered 2014-07-28 (×2): 100 mg via ORAL
  Filled 2014-07-27 (×2): qty 1

## 2014-07-27 MED ORDER — MAGNESIUM OXIDE 400 (241.3 MG) MG PO TABS
400.0000 mg | ORAL_TABLET | Freq: Two times a day (BID) | ORAL | Status: DC
  Administered 2014-07-28 (×2): 400 mg via ORAL
  Filled 2014-07-27 (×2): qty 1

## 2014-07-27 MED ORDER — ZOLPIDEM TARTRATE 5 MG PO TABS
5.0000 mg | ORAL_TABLET | Freq: Every evening | ORAL | Status: DC | PRN
  Administered 2014-07-28: 5 mg via ORAL
  Filled 2014-07-27: qty 1

## 2014-07-27 NOTE — Consult Note (Signed)
Primary cardiologist: Dr Pierre Bali MD Consulting cardiologist: Dr Carlyle Dolly MD  Clinical Summary [name] is a 64 y.o.male history of NICM LVEF 20-25% on home milrinone 0.375 mcg/kg/min, hx of VT with ICD, sarcoid CKD stage IV, HTN, paroxysmal afib on amiodarone and eliquis admitted with multiple systemic symptoms chills/sweats, dry cough, headache, diarrhea and fatigue since Thursday. He also reports worsening palpitations over that time period, with feeling of his heart pounding at times. He also developed a sharp chest pain that is worst with deep breathing and position. No edema, no orthopnea, no PND.    Last CHF clinic visit 07/02/14, weight at that time 232 lbs. Admit weight reportedly 221 lbs  WBC 7.4 Hgb 15.3 Plt 166 Na 135 K 3.3 BUN 31 Cr 1.69 Trop neg CXR no acute process CT head pending EKG SR, LAE, LAFB, nonspecific ST/T changes   Allergies  Allergen Reactions  . Nitroglycerin Other (See Comments)    "feels like head is going to bust open"  . Colchicine Nausea Only    Medications Scheduled Medications: . acetaminophen  650 mg Oral Once  . diphenhydrAMINE  12.5 mg Intravenous Once  . fentaNYL (SUBLIMAZE) injection  50 mcg Intravenous Once  . metoCLOPramide (REGLAN) injection  5 mg Intravenous Once     Infusions: . sodium chloride       PRN Medications:     Past Medical History  Diagnosis Date  . CHF (congestive heart failure)   . Sarcoidosis   . Cardiomyopathy, dilated, nonischemic     non ischemic by cath  . Acute on chronic systolic heart failure   . Automatic implantable cardiac defibrillator in situ   . Atrial fibrillation   . NSVT (nonsustained ventricular tachycardia)   . GERD (gastroesophageal reflux disease)   . Hypercholesteremia   . Shortness of breath   . Chronic kidney disease (CKD), stage III (moderate)   . Pacemaker   . Anginal pain   . Gout   . Hypertension     dr [name]  . Coronary artery disease   . Elevated  PSA 06/24/2013  . Myocardial infarction   . Headache   . Neuropathy     Past Surgical History  Procedure Laterality Date  . Pacemaker insertion  2009    with ICD  . Tee without cardioversion  01/17/2011    Procedure: TRANSESOPHAGEAL ECHOCARDIOGRAM (TEE);  Surgeon: Birdie Riddle, MD;  Location: [place];  Service: Cardiovascular;  Laterality: N/A;  . Cardioversion  01/17/2011    Procedure: CARDIOVERSION;  Surgeon: Birdie Riddle, MD;  Location: [place];  Service: Cardiovascular;  Laterality: N/A;  . Cardiac catheterization    . Insert / replace / remove pacemaker    . Anterior cervical decomp/discectomy fusion  08/21/2011    Procedure: ANTERIOR CERVICAL DECOMPRESSION/DISCECTOMY FUSION 2 LEVELS;  Surgeon: Marybelle Killings, MD;  Location: [place];  Service: Orthopedics;  Laterality: N/A;  C5-6, C6-7 Anterior Cervical Discectomy and Fusion, allograft, plate  . Tee without cardioversion N/A 02/16/2012    Procedure: TRANSESOPHAGEAL ECHOCARDIOGRAM (TEE);  Surgeon: Jolaine Artist, MD;  Location: [facility] ENDOSCOPY;  Service: Cardiovascular;  Laterality: N/A;  original case scheduled under his dad (who is deceased), rescheduled under correct mrn/pt/dob. [place]/dl  . Tee without cardioversion N/A 03/22/2012    Procedure: TRANSESOPHAGEAL ECHOCARDIOGRAM (TEE);  Surgeon: Lelon Perla, MD;  Location: [facility] ENDOSCOPY;  Service: Cardiovascular;  Laterality: N/A;  . Cardioversion N/A 03/22/2012    Procedure: CARDIOVERSION;  Surgeon: Denice Bors  Stanford Breed, MD;  Location: [place];  Service: Cardiovascular;  Laterality: N/A;  . Cardioversion N/A 04/26/2012    Procedure: CARDIOVERSION;  Surgeon: Jolaine Artist, MD;  Location: [facility] ENDOSCOPY;  Service: Cardiovascular;  Laterality: N/A;  . Central venous catheter tunneled insertion single lumen  09/16/2012    right IJ  . Cardioversion N/A 11/08/2012    Procedure: CARDIOVERSION;  Surgeon: Jolaine Artist, MD;  Location: [facility] ENDOSCOPY;  Service: Cardiovascular;   Laterality: N/A;  . Back surgery  1987, lower    Ruptured disk repair  . Peripherally inserted central catheter insertion  2013  . Right heart catheterization N/A 01/26/2012    Procedure: RIGHT HEART CATH;  Surgeon: Jolaine Artist, MD;  Location: [facility] CATH LAB;  Service: Cardiovascular;  Laterality: N/A;  . Right heart catheterization N/A 04/30/2013    Procedure: RIGHT HEART CATH;  Surgeon: Jolaine Artist, MD;  Location: [facility] CATH LAB;  Service: Cardiovascular;  Laterality: N/A;    Family History  Problem Relation Age of Onset  . Heart disease    . Heart failure    . Stroke    . Anesthesia problems Neg Hx   . Hypotension Neg Hx   . Malignant hyperthermia Neg Hx   . Pseudochol deficiency Neg Hx   . Heart failure Sister   . Heart attack Sister   . Hyperlipidemia Mother     Social History [name] reports that he has never smoked. He has never used smokeless tobacco. [name] reports that he does not drink alcohol.  Review of Systems CONSTITUTIONAL: per HPI HEENT: Eyes: No visual loss, blurred vision, double vision or yellow sclerae. No hearing loss, sneezing, congestion, runny nose or sore throat.  SKIN: No rash or itching.  CARDIOVASCULAR: No chest pain, chest pressure or chest discomfort. No palpitations or edema.  RESPIRATORY: per HPI.  GASTROINTESTINAL: per HPI.  GENITOURINARY: no polyuria, no dysuria NEUROLOGICAL: No headache, dizziness, syncope, paralysis, ataxia, numbness or tingling in the extremities. No change in bowel or bladder control.  MUSCULOSKELETAL: No muscle, back pain, joint pain or stiffness.  HEMATOLOGIC: No anemia, bleeding or bruising.  LYMPHATICS: No enlarged nodes. No history of splenectomy.  PSYCHIATRIC: No history of depression or anxiety.      Physical Examination Blood pressure 114/92, pulse 101, temperature 97.8 F (36.6 C), resp. rate 22, height 6\' 3"  (1.905 m), weight 221 lb (100.245 kg), SpO2 98 %. No intake or output data in the  24 hours ending 07/27/14 1925  HEENT: sclera clear  Cardiovascular: RRR, no m/r/g, no JVD  Respiratory: CTAB  GI: abdomen soft, NT, ND  MSK: no LE edema  Neuro: no focal deficits  Psych: appropriate affect   Lab Results  Basic Metabolic Panel:  Recent Labs Lab 07/27/14 1551  NA 135  K 3.3*  CL 101  CO2 24  GLUCOSE 156*  BUN 31*  CREATININE 1.69*  CALCIUM 9.5    Liver Function Tests: No results for input(s): AST, ALT, ALKPHOS, BILITOT, PROT, ALBUMIN in the last 168 hours.  CBC:  Recent Labs Lab 07/27/14 1551  WBC 7.4  HGB 15.3  HCT 43.5  MCV 89.1  PLT 166    Cardiac Enzymes: No results for input(s): CKTOTAL, CKMB, CKMBINDEX, TROPONINI in the last 168 hours.  BNP: Invalid input(s): POCBNP     Impression/Recommendations  1. Viral syndrome - multiple systemic symptoms likely related to viral illness, including sweats/chills, cough, probable sinus headache, fatigue, and diarrhea - management and evaluation per primary  team  2. Chronic systolic heart failure - does not appear volume overloaded, potentially even on the dry side due to diarrhea along with continued diuretic use. Uptrend in Cr from 2 month trend, weight reportedly 221 lbs (232 at last outpt visit). Would hold diuretic tomorrow, continue other CHF meds including milrinone.   3. Chest pain - atypical chest pain, pleuritic in description. Likely related to viral syndrome with pulmonary/pleural involvement - cycle cardiac enzymes overnight  4. Palpitations - history of atrial arrhythmias including afib, has been on amio. In sinus rhythm, monitor tele overnight. Needs to have ICD checked tomorrow. Keep K at 4 and Mg at 2.  Will need to be seen by CHF team in AM.   Carlyle Dolly, M.D.

## 2014-07-27 NOTE — ED Provider Notes (Signed)
CSN: [id number]     Arrival date & time 07/27/14  1534 History   First MD Initiated Contact with Patient 07/27/14 1825     Chief Complaint  Patient presents with  . Chest Pain    The patient said he has been having chest, since friday.  He says he thought it would go away and it got worse.       (Consider location/radiation/quality/duration/timing/severity/associated sxs/prior Treatment) Patient is a 64 y.o. male presenting with chest pain. The history is provided by the patient.  Chest Pain Pain location:  Substernal area Pain quality: sharp and throbbing   Pain radiates to:  Does not radiate Pain radiates to the back: no   Pain severity:  Moderate Onset quality:  Gradual Duration: 2-3 days. Timing:  Intermittent Progression:  Unchanged Chronicity:  New Context: at rest   Relieved by:  Nothing Worsened by:  Nothing tried Ineffective treatments:  None tried Associated symptoms: dizziness, headache, palpitations and shortness of breath   Associated symptoms: no abdominal pain, no fever, no nausea, no numbness and not vomiting     Past Medical History  Diagnosis Date  . CHF (congestive heart failure)   . Sarcoidosis   . Cardiomyopathy, dilated, nonischemic     non ischemic by cath  . Acute on chronic systolic heart failure   . Automatic implantable cardiac defibrillator in situ   . Atrial fibrillation   . NSVT (nonsustained ventricular tachycardia)   . GERD (gastroesophageal reflux disease)   . Hypercholesteremia   . Shortness of breath   . Chronic kidney disease (CKD), stage III (moderate)   . Pacemaker   . Anginal pain   . Gout   . Hypertension     dr [name]  . Coronary artery disease   . Elevated PSA 06/24/2013  . Myocardial infarction   . Headache   . Neuropathy    Past Surgical History  Procedure Laterality Date  . Pacemaker insertion  2009    with ICD  . Tee without cardioversion  01/17/2011    Procedure: TRANSESOPHAGEAL ECHOCARDIOGRAM (TEE);   Surgeon: Birdie Riddle, MD;  Location: [place];  Service: Cardiovascular;  Laterality: N/A;  . Cardioversion  01/17/2011    Procedure: CARDIOVERSION;  Surgeon: Birdie Riddle, MD;  Location: [place];  Service: Cardiovascular;  Laterality: N/A;  . Cardiac catheterization    . Insert / replace / remove pacemaker    . Anterior cervical decomp/discectomy fusion  08/21/2011    Procedure: ANTERIOR CERVICAL DECOMPRESSION/DISCECTOMY FUSION 2 LEVELS;  Surgeon: Marybelle Killings, MD;  Location: [place];  Service: Orthopedics;  Laterality: N/A;  C5-6, C6-7 Anterior Cervical Discectomy and Fusion, allograft, plate  . Tee without cardioversion N/A 02/16/2012    Procedure: TRANSESOPHAGEAL ECHOCARDIOGRAM (TEE);  Surgeon: Jolaine Artist, MD;  Location: [facility] ENDOSCOPY;  Service: Cardiovascular;  Laterality: N/A;  original case scheduled under his dad (who is deceased), rescheduled under correct mrn/pt/dob. [place]/dl  . Tee without cardioversion N/A 03/22/2012    Procedure: TRANSESOPHAGEAL ECHOCARDIOGRAM (TEE);  Surgeon: Lelon Perla, MD;  Location: [place];  Service: Cardiovascular;  Laterality: N/A;  . Cardioversion N/A 03/22/2012    Procedure: CARDIOVERSION;  Surgeon: Lelon Perla, MD;  Location: [facility] ENDOSCOPY;  Service: Cardiovascular;  Laterality: N/A;  . Cardioversion N/A 04/26/2012    Procedure: CARDIOVERSION;  Surgeon: Jolaine Artist, MD;  Location: [facility] ENDOSCOPY;  Service: Cardiovascular;  Laterality: N/A;  . Central venous catheter tunneled insertion single lumen  09/16/2012  right IJ  . Cardioversion N/A 11/08/2012    Procedure: CARDIOVERSION;  Surgeon: Jolaine Artist, MD;  Location: [facility] ENDOSCOPY;  Service: Cardiovascular;  Laterality: N/A;  . Back surgery  1987, lower    Ruptured disk repair  . Peripherally inserted central catheter insertion  2013  . Right heart catheterization N/A 01/26/2012    Procedure: RIGHT HEART CATH;  Surgeon: Jolaine Artist, MD;  Location: [facility] CATH LAB;   Service: Cardiovascular;  Laterality: N/A;  . Right heart catheterization N/A 04/30/2013    Procedure: RIGHT HEART CATH;  Surgeon: Jolaine Artist, MD;  Location: [facility] CATH LAB;  Service: Cardiovascular;  Laterality: N/A;   Family History  Problem Relation Age of Onset  . Heart disease    . Heart failure    . Stroke    . Anesthesia problems Neg Hx   . Hypotension Neg Hx   . Malignant hyperthermia Neg Hx   . Pseudochol deficiency Neg Hx   . Heart failure Sister   . Heart attack Sister   . Hyperlipidemia Mother    History  Substance Use Topics  . Smoking status: Never Smoker   . Smokeless tobacco: Never Used  . Alcohol Use: No    Review of Systems  Constitutional: Positive for chills. Negative for fever.  HENT: Negative for drooling and rhinorrhea.   Eyes: Negative for pain.  Respiratory: Positive for shortness of breath.   Cardiovascular: Positive for chest pain and palpitations. Negative for leg swelling.  Gastrointestinal: Negative for nausea, vomiting, abdominal pain and diarrhea.  Genitourinary: Negative for dysuria and hematuria.  Musculoskeletal: Negative for gait problem and neck pain.  Skin: Negative for color change.  Neurological: Positive for dizziness and headaches. Negative for numbness.  Hematological: Negative for adenopathy.  Psychiatric/Behavioral: Negative for behavioral problems.  All other systems reviewed and are negative.     Allergies  Nitroglycerin and Colchicine  Home Medications   Prior to Admission medications   Medication Sig Start Date End Date Taking? Authorizing Provider  allopurinol (ZYLOPRIM) 100 MG tablet Take 1 tablet (100 mg total) by mouth daily. 02/05/12   Wynetta Emery, PA-C  ALPRAZolam Duanne Moron) 0.5 MG tablet Take 1 tablet (0.5 mg total) by mouth 3 (three) times daily as needed for anxiety. 07/07/14   Debbrah Alar, NP  amiodarone (PACERONE) 200 MG tablet Take 1 tablet (200 mg total) by mouth 2 (two) times daily. 01/15/14   Amy  D Ninfa Meeker, NP  apixaban (ELIQUIS) 5 MG TABS tablet Take 1 tablet (5 mg total) by mouth 2 (two) times daily. 10/27/13   Larey Dresser, MD  carvedilol (COREG) 6.25 MG tablet Take 1 tablet (6.25 mg total) by mouth 2 (two) times daily with a meal. 04/07/14   Larey Dresser, MD  diphenhydrAMINE (BENADRYL) 50 MG capsule Take 50 mg by mouth daily as needed for itching.    Historical Provider, MD  eplerenone (INSPRA) 25 MG tablet Take 12.5 mg by mouth every other day.  09/11/13   Rande Brunt, NP  fluticasone (FLONASE) 50 MCG/ACT nasal spray Place 2 sprays into both nostrils daily as needed for allergies. 10/17/13   Debbrah Alar, NP  gabapentin (NEURONTIN) 600 MG tablet Take 600 mg by mouth daily.    Historical Provider, MD  magnesium oxide (MAG-OX) 400 MG tablet Take 400 mg by mouth 2 (two) times daily.    Historical Provider, MD  metolazone (ZAROXOLYN) 2.5 MG tablet Take 1 tablet (2.5 mg total) by mouth daily as  needed (for fluid retention). 08/27/13   Jolaine Artist, MD  milrinone [facility]) 20 MG/100ML SOLN infusion Inject 0.375 mcg/kg/min into the vein continuous. 440mg  in 476ml infusion 2.4 ml per hour    Historical Provider, MD  NEXIUM 40 MG capsule Take 1 tablet by mouth daily. 05/31/14   Historical Provider, MD  Oxycodone HCl 20 MG TABS Take 1 tablet (20 mg total) by mouth 3 (three) times daily as needed (pain.). 12/10/13   Kelvin Cellar, MD  potassium chloride SA (K-DUR,KLOR-CON) 20 MEQ tablet Take 2 tablets (40 mEq total) by mouth daily. 02/09/14   Amy D Ninfa Meeker, NP  promethazine (PHENERGAN) 25 MG tablet Take 25 mg by mouth every 6 (six) hours as needed for nausea or vomiting.    Historical Provider, MD  ranitidine (ZANTAC) 150 MG capsule Take 150 mg by mouth daily as needed for heartburn (reflux).    Historical Provider, MD  senna-docusate (SENOKOT-S) 8.6-50 MG per tablet Take 1-2 tablets by mouth 2 (two) times daily as needed for mild constipation or moderate constipation. 10/21/13   Debbrah Alar, NP  sertraline (ZOLOFT) 100 MG tablet Take 1 tablet (100 mg total) by mouth daily. 05/13/14   Debbrah Alar, NP  sildenafil (VIAGRA) 100 MG tablet Take 1 tablet (100 mg total) by mouth daily as needed for erectile dysfunction. 04/07/14   Larey Dresser, MD  sucralfate (CARAFATE) 1 GM/10ML suspension Take 10 mLs (1 g total) by mouth 4 (four) times daily. 09/09/13   Laban Emperor Zehr, PA-C  torsemide (DEMADEX) 20 MG tablet TAKE 60 MG BY MOUTH IN THE AM & 40 MG BY MOUTH IN THE PM    Historical Provider, MD  zolpidem (AMBIEN) 5 MG tablet TAKE 1.5 TABLETS BY MOUTH EVERY DAY AT BEDTIME 05/08/14   Debbrah Alar, NP   BP 114/92 mmHg  Pulse 101  Temp(Src) 97.8 F (36.6 C)  Resp 22  Ht 6\' 3"  (1.905 m)  Wt 221 lb (100.245 kg)  BMI 27.62 kg/m2  SpO2 98% Physical Exam  Constitutional: He is oriented to person, place, and time. He appears well-developed and well-nourished.  HENT:  Head: Normocephalic and atraumatic.  Right Ear: External ear normal.  Left Ear: External ear normal.  Nose: Nose normal.  Mouth/Throat: Oropharynx is clear and moist. No oropharyngeal exudate.  Eyes: Conjunctivae and EOM are normal. Pupils are equal, round, and reactive to light.  Neck: Normal range of motion. Neck supple.  Cardiovascular: Normal rate, regular rhythm, normal heart sounds and intact distal pulses.  Exam reveals no gallop and no friction rub.   No murmur heard. Pulmonary/Chest: Effort normal and breath sounds normal. No respiratory distress. He has no wheezes.  Abdominal: Soft. Bowel sounds are normal. He exhibits no distension. There is no tenderness. There is no rebound and no guarding.  Musculoskeletal: Normal range of motion. He exhibits no edema or tenderness.  Neurological: He is alert and oriented to person, place, and time.  alert, oriented x3 speech: normal in context and clarity memory: intact grossly cranial nerves II-XII: intact motor strength: full proximally and distally no  involuntary movements or tremors sensation: intact to light touch diffusely  cerebellar: finger-to-nose and heel-to-shin intact gait: slight ataxia d/t worsening dizziness w/ standing  Skin: Skin is warm and dry.  Psychiatric: He has a normal mood and affect. His behavior is normal.  Nursing note and vitals reviewed.   ED Course  Procedures (including critical care time) Labs Review Labs Reviewed  BASIC METABOLIC PANEL -  Abnormal; Notable for the following:    Potassium 3.3 (*)    Glucose, Bld 156 (*)    BUN 31 (*)    Creatinine, Ser 1.69 (*)    GFR calc non Af Amer 41 (*)    GFR calc Af Amer 48 (*)    All other components within normal limits  CBC  PROTIME-INR  URINALYSIS, ROUTINE W REFLEX MICROSCOPIC (NOT AT [facility])  Randolm Idol, ED    Imaging Review Dg Chest 2 View  07/27/2014   CLINICAL DATA:  Chest pain.  Weight loss.  EXAM: CHEST  2 VIEW  COMPARISON:  03/26/2014  FINDINGS: AICD in the right ventricle and unchanged. Right jugular central venous catheter tip in the SVC also unchanged.  Lungs are clear. No infiltrate effusion or mass. No change from the prior study.  IMPRESSION: No active cardiopulmonary disease.   Electronically Signed   By: Franchot Gallo M.D.   On: 07/27/2014 16:15     EKG Interpretation   Date/Time:  Monday July 27 2014 15:42:12 EDT Ventricular Rate:  115 PR Interval:  134 QRS Duration: 112 QT Interval:  364 QTC Calculation: 503 R Axis:   -53 Text Interpretation:  Sinus tachycardia with Premature atrial complexes  Left anterior fascicular block T wave abnormality, consider inferior  ischemia Abnormal ECG Confirmed by Jeneen Rinks  MD, [place] ([id number]) on 07/27/2014  3:46:24 PM      MDM   Final diagnoses:  Dizziness  Other chest pain    7:16 PM 64 y.o. male w h/o NICM EF 20-25%, s/p AICD placement, milrinone gtt at baseline, afib on eliquis, CKD, CAD who presents with multiple complaints which began 4 days ago. He complains of intermittent chills,  shortness of breath but denies any fever. He developed a gradual onset bitemporal headache yesterday. He describes it as a 6 out of 10 throbbing pain now. He is also had dizziness and intermittent sharp and throbbing chest pains over the last few days. He is afebrile and has a heart rate in the 90s to low 100s on exam. Vital signs otherwise unremarkable. Screening workup thus far noncontributory. Will discuss the case with cardiology and likely admit to medicine.  Consulted cards. Hospitalist to admit.   Pamella Pert, MD 07/27/14 2153

## 2014-07-27 NOTE — Telephone Encounter (Signed)
Triage call Ahc,rn called to report Irregular HR 107 Weight down (221) Chills,fever 99.5 oral PICC line site-sore to touch, painful per patient  Per vo Amy Clegg,NP Pt should report to ER for further evaluation

## 2014-07-27 NOTE — H&P (Signed)
[facility] History and Physical  [name] IHK:[id number] DOB: Feb 25, 1950 DOA: 07/27/2014  Referring physician: EDP PCP: Nance Pear., NP   Chief Complaint: Chest pain   HPI: [name] is a 64 y.o. male h/o NICM, EF 20-25%, on home milrinone, h/o VT with AICD.  PAF on amiodarone.  Patient presents with multi-system complaints of chest pain, headache, dry cough, diarrhea, fatigue.  Worsening palpitations since that time.  Chest pain is pleuritic, worse when sitting up, better when lying down flat on his back (which he is doing when I enter the room to examine him).  Review of Systems: Systems reviewed.  As above, otherwise negative  Past Medical History  Diagnosis Date  . CHF (congestive heart failure)   . Sarcoidosis   . Cardiomyopathy, dilated, nonischemic     non ischemic by cath  . Acute on chronic systolic heart failure   . Automatic implantable cardiac defibrillator in situ   . Atrial fibrillation   . NSVT (nonsustained ventricular tachycardia)   . GERD (gastroesophageal reflux disease)   . Hypercholesteremia   . Shortness of breath   . Chronic kidney disease (CKD), stage III (moderate)   . Pacemaker   . Anginal pain   . Gout   . Hypertension     dr [name]  . Coronary artery disease   . Elevated PSA 06/24/2013  . Myocardial infarction   . Headache   . Neuropathy    Past Surgical History  Procedure Laterality Date  . Pacemaker insertion  2009    with ICD  . Tee without cardioversion  01/17/2011    Procedure: TRANSESOPHAGEAL ECHOCARDIOGRAM (TEE);  Surgeon: Birdie Riddle, MD;  Location: [place];  Service: Cardiovascular;  Laterality: N/A;  . Cardioversion  01/17/2011    Procedure: CARDIOVERSION;  Surgeon: Birdie Riddle, MD;  Location: [place];  Service: Cardiovascular;  Laterality: N/A;  . Cardiac catheterization    . Insert / replace / remove pacemaker    . Anterior cervical decomp/discectomy fusion  08/21/2011   Procedure: ANTERIOR CERVICAL DECOMPRESSION/DISCECTOMY FUSION 2 LEVELS;  Surgeon: Marybelle Killings, MD;  Location: [place];  Service: Orthopedics;  Laterality: N/A;  C5-6, C6-7 Anterior Cervical Discectomy and Fusion, allograft, plate  . Tee without cardioversion N/A 02/16/2012    Procedure: TRANSESOPHAGEAL ECHOCARDIOGRAM (TEE);  Surgeon: Jolaine Artist, MD;  Location: [facility] ENDOSCOPY;  Service: Cardiovascular;  Laterality: N/A;  original case scheduled under his dad (who is deceased), rescheduled under correct mrn/pt/dob. [place]/dl  . Tee without cardioversion N/A 03/22/2012    Procedure: TRANSESOPHAGEAL ECHOCARDIOGRAM (TEE);  Surgeon: Lelon Perla, MD;  Location: [place];  Service: Cardiovascular;  Laterality: N/A;  . Cardioversion N/A 03/22/2012    Procedure: CARDIOVERSION;  Surgeon: Lelon Perla, MD;  Location: [facility] ENDOSCOPY;  Service: Cardiovascular;  Laterality: N/A;  . Cardioversion N/A 04/26/2012    Procedure: CARDIOVERSION;  Surgeon: Jolaine Artist, MD;  Location: [facility] ENDOSCOPY;  Service: Cardiovascular;  Laterality: N/A;  . Central venous catheter tunneled insertion single lumen  09/16/2012    right IJ  . Cardioversion N/A 11/08/2012    Procedure: CARDIOVERSION;  Surgeon: Jolaine Artist, MD;  Location: [facility] ENDOSCOPY;  Service: Cardiovascular;  Laterality: N/A;  . Back surgery  1987, lower    Ruptured disk repair  . Peripherally inserted central catheter insertion  2013  . Right heart catheterization N/A 01/26/2012    Procedure: RIGHT HEART CATH;  Surgeon: Jolaine Artist, MD;  Location: [facility] CATH LAB;  Service: Cardiovascular;  Laterality: N/A;  . Right heart catheterization N/A 04/30/2013    Procedure: RIGHT HEART CATH;  Surgeon: Jolaine Artist, MD;  Location: [facility] CATH LAB;  Service: Cardiovascular;  Laterality: N/A;   Social History:  reports that he has never smoked. He has never used smokeless tobacco. He reports that he uses illicit drugs ("Crack" cocaine and Cocaine). He  reports that he does not drink alcohol.  Allergies  Allergen Reactions  . Nitroglycerin Other (See Comments)    "feels like head is going to bust open"  . Colchicine Nausea Only    Family History  Problem Relation Age of Onset  . Heart disease    . Heart failure    . Stroke    . Anesthesia problems Neg Hx   . Hypotension Neg Hx   . Malignant hyperthermia Neg Hx   . Pseudochol deficiency Neg Hx   . Heart failure Sister   . Heart attack Sister   . Hyperlipidemia Mother      Prior to Admission medications   Medication Sig Start Date End Date Taking? Authorizing Provider  allopurinol (ZYLOPRIM) 100 MG tablet Take 1 tablet (100 mg total) by mouth daily. 02/05/12  Yes Wynetta Emery, PA-C  ALPRAZolam Duanne Moron) 0.5 MG tablet Take 1 tablet (0.5 mg total) by mouth 3 (three) times daily as needed for anxiety. 07/07/14  Yes Debbrah Alar, NP  amiodarone (PACERONE) 200 MG tablet Take 1 tablet (200 mg total) by mouth 2 (two) times daily. 01/15/14  Yes Amy D Clegg, NP  apixaban (ELIQUIS) 5 MG TABS tablet Take 1 tablet (5 mg total) by mouth 2 (two) times daily. 10/27/13  Yes Larey Dresser, MD  carvedilol (COREG) 6.25 MG tablet Take 1 tablet (6.25 mg total) by mouth 2 (two) times daily with a meal. 04/07/14  Yes Larey Dresser, MD  diphenhydrAMINE (BENADRYL) 50 MG capsule Take 50 mg by mouth daily as needed for itching.   Yes Historical Provider, MD  fluticasone (FLONASE) 50 MCG/ACT nasal spray Place 2 sprays into both nostrils daily as needed for allergies. 10/17/13  Yes Debbrah Alar, NP  HORIZANT 600 MG TBCR Take 600 mg by mouth at bedtime. 07/08/14  Yes Historical Provider, MD  magnesium oxide (MAG-OX) 400 MG tablet Take 400 mg by mouth 2 (two) times daily.   Yes Historical Provider, MD  metolazone (ZAROXOLYN) 2.5 MG tablet Take 1 tablet (2.5 mg total) by mouth daily as needed (for fluid retention). 08/27/13  Yes Shaune Pascal Bensimhon, MD  milrinone [facility]) 20 MG/100ML SOLN infusion Inject  0.375 mcg/kg/min into the vein continuous. 440mg  in 424ml infusion 2.4 ml per hour   Yes Historical Provider, MD  NEXIUM 40 MG capsule Take 1 tablet by mouth daily. 05/31/14  Yes Historical Provider, MD  Oxycodone HCl 20 MG TABS Take 1 tablet (20 mg total) by mouth 3 (three) times daily as needed (pain.). Patient taking differently: Take 1 tablet by mouth 4 (four) times daily.  12/10/13  Yes Kelvin Cellar, MD  potassium chloride SA (K-DUR,KLOR-CON) 20 MEQ tablet Take 2 tablets (40 mEq total) by mouth daily. 02/09/14  Yes Amy D Clegg, NP  ranitidine (ZANTAC) 150 MG capsule Take 150 mg by mouth daily as needed for heartburn (reflux).   Yes Historical Provider, MD  senna-docusate (SENOKOT-S) 8.6-50 MG per tablet Take 1-2 tablets by mouth 2 (two) times daily as needed for mild constipation or moderate constipation. 10/21/13  Yes Debbrah Alar, NP  sertraline (ZOLOFT) 100  MG tablet Take 1 tablet (100 mg total) by mouth daily. 05/13/14  Yes Debbrah Alar, NP  sildenafil (VIAGRA) 100 MG tablet Take 1 tablet (100 mg total) by mouth daily as needed for erectile dysfunction. 04/07/14  Yes Larey Dresser, MD  sucralfate (CARAFATE) 1 GM/10ML suspension Take 10 mLs (1 g total) by mouth 4 (four) times daily. Patient taking differently: Take 1 g by mouth 4 (four) times daily as needed (for indigestion).  09/09/13  Yes Jessica D Zehr, PA-C  torsemide (DEMADEX) 20 MG tablet Take 40 mg by mouth 2 (two) times daily.    Yes Historical Provider, MD  zolpidem (AMBIEN) 5 MG tablet TAKE 1.5 TABLETS BY MOUTH EVERY DAY AT BEDTIME Patient taking differently: TAKE 1-2 TABLETS BY MOUTH EVERY DAY AT BEDTIME 05/08/14  Yes Debbrah Alar, NP  eplerenone (INSPRA) 25 MG tablet Take 12.5 mg by mouth every other day.  09/11/13   Rande Brunt, NP   Physical Exam: Filed Vitals:   07/27/14 2100  BP: 123/96  Pulse: 96  Temp:   Resp: 20    BP 123/96 mmHg  Pulse 96  Temp(Src) 97.8 F (36.6 C)  Resp 20  Ht 6\' 3"  (1.905 m)   Wt 100.245 kg (221 lb)  BMI 27.62 kg/m2  SpO2 87%  General Appearance:    Alert, oriented, no distress, appears stated age  Head:    Normocephalic, atraumatic  Eyes:    PERRL, EOMI, sclera non-icteric        Nose:   Nares without drainage or epistaxis. Mucosa, turbinates normal  Throat:   Moist mucous membranes. Oropharynx without erythema or exudate.  Neck:   Supple. No carotid bruits.  No thyromegaly.  No lymphadenopathy.   Back:     No CVA tenderness, no spinal tenderness  Lungs:     Clear to auscultation bilaterally, without wheezes, rhonchi or rales  Chest wall:    No tenderness to palpitation  Heart:    Regular rate and rhythm without murmurs, gallops, rubs  Abdomen:     Soft, non-tender, nondistended, normal bowel sounds, no organomegaly  Genitalia:    deferred  Rectal:    deferred  Extremities:   No clubbing, cyanosis or edema.  Pulses:   2+ and symmetric all extremities  Skin:   Skin color, texture, turgor normal, no rashes or lesions  Lymph nodes:   Cervical, supraclavicular, and axillary nodes normal  Neurologic:   CNII-XII intact. Normal strength, sensation and reflexes      throughout    Labs on Admission:  Basic Metabolic Panel:  Recent Labs Lab 07/27/14 1551  NA 135  K 3.3*  CL 101  CO2 24  GLUCOSE 156*  BUN 31*  CREATININE 1.69*  CALCIUM 9.5   Liver Function Tests: No results for input(s): AST, ALT, ALKPHOS, BILITOT, PROT, ALBUMIN in the last 168 hours. No results for input(s): LIPASE, AMYLASE in the last 168 hours. No results for input(s): AMMONIA in the last 168 hours. CBC:  Recent Labs Lab 07/27/14 1551  WBC 7.4  HGB 15.3  HCT 43.5  MCV 89.1  PLT 166   Cardiac Enzymes: No results for input(s): CKTOTAL, CKMB, CKMBINDEX, TROPONINI in the last 168 hours.  BNP (last 3 results)  Recent Labs  08/06/13 1245 12/09/13 2250  PROBNP 3225.0* 665.8*   CBG: No results for input(s): GLUCAP in the last 168 hours.  Radiological Exams on  Admission: Dg Chest 2 View  07/27/2014   CLINICAL DATA:  Chest  pain.  Weight loss.  EXAM: CHEST  2 VIEW  COMPARISON:  03/26/2014  FINDINGS: AICD in the right ventricle and unchanged. Right jugular central venous catheter tip in the SVC also unchanged.  Lungs are clear. No infiltrate effusion or mass. No change from the prior study.  IMPRESSION: No active cardiopulmonary disease.   Electronically Signed   By: Franchot Gallo M.D.   On: 07/27/2014 16:15   Ct Head Wo Contrast  07/27/2014   CLINICAL DATA:  Shortness of breath. Dizziness, lightheadedness. Nausea, vomiting, diarrhea.  EXAM: CT HEAD WITHOUT CONTRAST  TECHNIQUE: Contiguous axial images were obtained from the base of the skull through the vertex without intravenous contrast.  COMPARISON:  12/10/2013  FINDINGS: There is focal area of encephalomalacia in the left occipital lobe consistent with old infarct. Bilateral basal ganglia calcifications are present. There is no intra or extra-axial fluid collection or mass lesion. The basilar cisterns and ventricles have a normal appearance. There is no CT evidence for acute infarction or hemorrhage. Bone windows are unremarkable.  IMPRESSION: 1. Old left occipital infarct. 2.  No evidence for acute intracranial abnormality.   Electronically Signed   By: Nolon Nations M.D.   On: 07/27/2014 20:58    EKG: Independently reviewed.  Assessment/Plan Active Problems:   Chest pain, mid sternal   Chronic systolic CHF (congestive heart failure), NYHA class 3-4   Chest pain   Palpitations   1. Chest pain, mid sternal -  1. Tele monitor 2. Serial trops 3. Cards on board 4. Overnight obs planned 5. Agree with Dr. Harl Bowie, seems unlikely to be ACS. 2. Chronic systolic CHF - 1. Milrinone per cards 2. Cards following 3. Continue home meds except diuretics, hold those tomorrow per cards.    Code Status: Full code  Family Communication: No family in room Disposition Plan: Admit to obs   Time spent: 30  min  GARDNER, JARED M. [facility] Pager [phone]  If 7AM-7PM, please contact the day team taking care of the patient [email or web address] Password [email or web address] 07/27/2014, 9:59 PM

## 2014-07-27 NOTE — ED Notes (Signed)
The patient said he has been having chest, since friday.  He says he thought it would go away and it got worse.  The patient has had SOB, dizziness,lightheadedness, nausea, vomiting and diarrhea.  He is a CHF on Milranone.  He rates his pain 7/10.

## 2014-07-28 DIAGNOSIS — R0789 Other chest pain: Secondary | ICD-10-CM

## 2014-07-28 DIAGNOSIS — I5022 Chronic systolic (congestive) heart failure: Secondary | ICD-10-CM | POA: Diagnosis not present

## 2014-07-28 LAB — CBC WITH DIFFERENTIAL/PLATELET
Basophils Absolute: 0 10*3/uL (ref 0.0–0.1)
Basophils Relative: 0 % (ref 0–1)
EOS PCT: 1 % (ref 0–5)
Eosinophils Absolute: 0.1 10*3/uL (ref 0.0–0.7)
HCT: 39.3 % (ref 39.0–52.0)
Hemoglobin: 13.5 g/dL (ref 13.0–17.0)
Lymphocytes Relative: 30 % (ref 12–46)
Lymphs Abs: 1.4 10*3/uL (ref 0.7–4.0)
MCH: 30.6 pg (ref 26.0–34.0)
MCHC: 34.4 g/dL (ref 30.0–36.0)
MCV: 89.1 fL (ref 78.0–100.0)
Monocytes Absolute: 0.7 10*3/uL (ref 0.1–1.0)
Monocytes Relative: 16 % — ABNORMAL HIGH (ref 3–12)
NEUTROS PCT: 53 % (ref 43–77)
Neutro Abs: 2.4 10*3/uL (ref 1.7–7.7)
PLATELETS: 149 10*3/uL — AB (ref 150–400)
RBC: 4.41 MIL/uL (ref 4.22–5.81)
RDW: 13.5 % (ref 11.5–15.5)
WBC: 4.6 10*3/uL (ref 4.0–10.5)

## 2014-07-28 LAB — BASIC METABOLIC PANEL
ANION GAP: 8 (ref 5–15)
BUN: 26 mg/dL — AB (ref 6–20)
CO2: 25 mmol/L (ref 22–32)
Calcium: 9.1 mg/dL (ref 8.9–10.3)
Chloride: 103 mmol/L (ref 101–111)
Creatinine, Ser: 1.31 mg/dL — ABNORMAL HIGH (ref 0.61–1.24)
GFR calc Af Amer: >60 mL/min (ref >60)
GFR calc non Af Amer: 56 mL/min — ABNORMAL LOW (ref >60)
Glucose, Bld: 107 mg/dL — ABNORMAL HIGH (ref 65–99)
POTASSIUM: 3.2 mmol/L — AB (ref 3.5–5.1)
Sodium: 136 mmol/L (ref 135–145)

## 2014-07-28 LAB — TROPONIN I
Troponin I: 0.07 ng/mL — ABNORMAL HIGH (ref <0.031)
Troponin I: 0.06 ng/mL — ABNORMAL HIGH (ref <0.031)

## 2014-07-28 LAB — MRSA PCR SCREENING: MRSA by PCR: NEGATIVE

## 2014-07-28 MED ORDER — POTASSIUM CHLORIDE CRYS ER 20 MEQ PO TBCR
40.0000 meq | EXTENDED_RELEASE_TABLET | Freq: Once | ORAL | Status: AC
  Administered 2014-07-28: 40 meq via ORAL
  Filled 2014-07-28: qty 2

## 2014-07-28 MED ORDER — MILRINONE IN DEXTROSE 20 MG/100ML IV SOLN
0.3750 ug/kg/min | INTRAVENOUS | Status: DC
  Filled 2014-07-28: qty 100

## 2014-07-28 MED ORDER — ACETAMINOPHEN 325 MG PO TABS: 650.0000 mg | ORAL_TABLET | ORAL | Status: DC | PRN

## 2014-07-28 MED ORDER — OXYCODONE HCL 20 MG PO TABS: 1.0000 | ORAL_TABLET | Freq: Three times a day (TID) | ORAL | Status: DC | PRN

## 2014-07-28 NOTE — Progress Notes (Deleted)
Advanced Heart Failure Rounding Note  Primary Cardiologist: Bensimhon  Subjective:    Weight 232 lbs in clinic on 6/30, 229 this morning.  64 y.o. male with history of NICM EF 20-25%, s/p AICD placement, home milrinone, afib, CKD, and CAD. He presented to [facility] 07/27/14 c/o chills, SOB, HA, and intermittent chest pains.  He also complains of diarrhea and fatigue since 7/22.  He states he has continued taking his medications including his diuretics. He denies any known sick contacts.  CT head and CXR in ED were both unremarkable. Cyclic troponins negative  Weight trending down at home, pt does not feel fluid overloaded. Says he lost 20 lbs since June according to the hospital scale this admission.  C/o lightheadedness and dizziness as well.  He does say he is tender around his PICC site.    Objective:   Weight Range: 229 lb (103.874 kg) Body mass index is 28.62 kg/(m^2).   Vital Signs:   Temp:  [97.8 F (36.6 C)-98.2 F (36.8 C)] 98.2 F (36.8 C) (07/26 0733) Pulse Rate:  [92-117] 117 (07/25 2200) Resp:  [9-29] 9 (07/26 0015) BP: (93-136)/(71-100) 121/79 mmHg (07/26 0015) SpO2:  [87 %-100 %] 100 % (07/25 2330) Weight:  [221 lb (100.245 kg)-229 lb (103.874 kg)] 229 lb (103.874 kg) (07/25 2330) Last BM Date: 07/27/14  Weight change: Filed Weights   07/27/14 1545 07/27/14 2330  Weight: 221 lb (100.245 kg) 229 lb (103.874 kg)    Intake/Output:   Intake/Output Summary (Last 24 hours) at 07/28/14 0849 Last data filed at 07/28/14 0000  Gross per 24 hour  Intake    240 ml  Output      0 ml  Net    240 ml     Physical Exam: General: Pleasant, NAD. Neuro: Alert and oriented X 3. Moves all extremities spontaneously. Psych: Normal affect. HEENT: Normal Neck: Supple without bruits. JVP 6-7 Lungs: Resp regular and unlabored, CTA. Chest: R upper chest hickman site with milrinone infusting.  No erythema or warmth appreciated.  No discharge Heart: Rapid HR, RRR no s3,  s4,. 2/6 HSM LLSB Abdomen: Soft, non-tender, non-distended, BS + x 4.  Extremities: No clubbing, cyanosis or edema. DP/PT/Radials 2+ and equal bilaterally.   Telemetry:  Sinus tach as high as 130s.  Labs: CBC  Recent Labs  07/27/14 1551  WBC 7.4  HGB 15.3  HCT 43.5  MCV 89.1  PLT 481   Basic Metabolic Panel  Recent Labs  07/27/14 1551  NA 135  K 3.3*  CL 101  CO2 24  GLUCOSE 156*  BUN 31*  CALCIUM 9.5   Liver Function Tests No results for input(s): AST, ALT, ALKPHOS, BILITOT, PROT, ALBUMIN in the last 72 hours. No results for input(s): LIPASE, AMYLASE in the last 72 hours. Cardiac Enzymes  Recent Labs  07/27/14 2227 07/28/14 0040 07/28/14 0411  TROPONINI 0.07* 0.07* 0.06*    BNP: BNP (last 3 results)  Recent Labs  05/19/14 1634 07/02/14 1010 07/27/14 1928  BNP 537.0* 354.6* 407.5*    ProBNP (last 3 results)  Recent Labs  08/06/13 1245 12/09/13 2250  PROBNP 3225.0* 665.8*     D-Dimer No results for input(s): DDIMER in the last 72 hours. Hemoglobin A1C No results for input(s): HGBA1C in the last 72 hours. Fasting Lipid Panel No results for input(s): CHOL, HDL, LDLCALC, TRIG, CHOLHDL, LDLDIRECT in the last 72 hours. Thyroid Function Tests No results for input(s): TSH, T4TOTAL, T3FREE, THYROIDAB in the last 72 hours.  Invalid  input(s): FREET3  Other results:     Imaging/Studies:  Dg Chest 2 View  07/27/2014   CLINICAL DATA:  Chest pain.  Weight loss.  EXAM: CHEST  2 VIEW  COMPARISON:  03/26/2014  FINDINGS: AICD in the right ventricle and unchanged. Right jugular central venous catheter tip in the SVC also unchanged.  Lungs are clear. No infiltrate effusion or mass. No change from the prior study.  IMPRESSION: No active cardiopulmonary disease.   Electronically Signed   By: Franchot Gallo M.D.   On: 07/27/2014 16:15   Ct Head Wo Contrast  07/27/2014   CLINICAL DATA:  Shortness of breath. Dizziness, lightheadedness. Nausea, vomiting,  diarrhea.  EXAM: CT HEAD WITHOUT CONTRAST  TECHNIQUE: Contiguous axial images were obtained from the base of the skull through the vertex without intravenous contrast.  COMPARISON:  12/10/2013  FINDINGS: There is focal area of encephalomalacia in the left occipital lobe consistent with old infarct. Bilateral basal ganglia calcifications are present. There is no intra or extra-axial fluid collection or mass lesion. The basilar cisterns and ventricles have a normal appearance. There is no CT evidence for acute infarction or hemorrhage. Bone windows are unremarkable.  IMPRESSION: 1. Old left occipital infarct. 2.  No evidence for acute intracranial abnormality.   Electronically Signed   By: Nolon Nations M.D.   On: 07/27/2014 20:58     Latest Echo  Latest Cath   Medications:     Scheduled Medications: . acetaminophen  650 mg Oral Once  . allopurinol  100 mg Oral Daily  . amiodarone  200 mg Oral BID  . apixaban  5 mg Oral BID  . carvedilol  6.25 mg Oral BID WC  . gabapentin  600 mg Oral QHS  . magnesium oxide  400 mg Oral BID  . pantoprazole  80 mg Oral Q1200  . sertraline  100 mg Oral Daily     Infusions:     PRN Medications:  acetaminophen, ALPRAZolam, diphenhydrAMINE, fluticasone, ondansetron (ZOFRAN) IV, oxyCODONE, senna-docusate, sucralfate, zolpidem   Assessment/Plan   1. Viral syndrome 2. Chronic systolic HF- NICM 87-86%. St Jude ICD (no Corevue) 3. Chest pain 4. Afib- NSR currently on amio.  5. CKD, Stage IV  ? Viral syndrome with possible component of dehydration with lightheadedness and dizziness, also more heme concentrated then several weeks ago.  Afebrile and no white count currently. Near recent weight in clinic.  No labs drawn for today, will put in. Cyclic troponins negative. CXR and head CT negative.  Continue milrinone via pts home supply.   Will hold diuresis for today.  ? Giving small bolus of fluid.  Will replace potassium.   Length of Stay:     Shirley Friar PA-C 07/28/2014, 8:49 AM  Advanced Heart Failure Team Pager [phone] (M-F; 7a - 4p)  Please contact [facility] for night-coverage after hours (4p -7a ) and weekends on [email or web address]

## 2014-07-28 NOTE — Progress Notes (Signed)
Nutrition Brief Note  Patient identified on the Malnutrition Screening Tool (MST) Report. Patient thinks he has lost 20 lbs since June, however, this is not confirmed by review of the usual weights below. Weight seems to have fluctuated with a little weight loss, nothing significant for the time frame.  Wt Readings from Last 15 Encounters:  07/27/14 229 lb (103.874 kg)  07/02/14 234 lb 8 oz (106.369 kg)  06/03/14 241 lb 12.8 oz (109.68 kg)  06/02/14 237 lb 12 oz (107.843 kg)  05/19/14 232 lb (105.235 kg)  05/13/14 238 lb 6.4 oz (108.138 kg)  04/21/14 240 lb (108.863 kg)  04/08/14 244 lb 3.2 oz (110.768 kg)  04/07/14 245 lb (111.131 kg)  03/26/14 242 lb (109.77 kg)  03/25/14 242 lb (109.77 kg)  03/09/14 249 lb (112.946 kg)  02/24/14 247 lb (112.038 kg)  02/09/14 244 lb (110.678 kg)  01/29/14 246 lb (111.585 kg)    Body mass index is 28.62 kg/(m^2). Patient meets criteria for overweight based on current BMI.   Diet just advanced to heart healthy. Labs and medications reviewed.   No nutrition interventions warranted at this time. If nutrition issues arise, please consult RD.   Molli Barrows, RD, LDN, [place] Pager [phone] After Hours Pager [phone]

## 2014-07-28 NOTE — Progress Notes (Signed)
[facility]  Patient Status: Active pt with [facility] prior to this readmission  [facility] is providing the following services: [facility] and [facility] for home Milrinone.  [facility] hospital team will follow Mr. Mccarry while he is here and support transition home when ordered.   If patient discharges after hours, please call [phone].   Larry Sierras 07/28/2014, 2:32 PM

## 2014-07-28 NOTE — Progress Notes (Signed)
Called [facility] at [phone] after hours about pt being D/C home this evening. Answering service person unable to get this writer in contact with the right agent. Pt to contact [facility] tomorrow to resume service. Discharged home with family, in stable condition.

## 2014-07-28 NOTE — Progress Notes (Signed)
Pt running tachy to 120s while asleep. VSS, no complaints, sent text to on-call

## 2014-07-28 NOTE — Discharge Summary (Signed)
Physician Discharge Summary  [name] XBD:[id number] DOB: 1950/10/11 DOA: 07/27/2014  PCP: Nance Pear., NP  Admit date: 07/27/2014 Discharge date: 07/29/2014  Time spent: 35 minutes  Recommendations for Outpatient Follow-up:  1. Keep appointment with CHF clinic  Discharge Diagnoses:  Active Problems:   Chest pain, mid sternal   Chronic systolic CHF (congestive heart failure), NYHA class 3-4   Chest pain   Palpitations   Other chest pain   Discharge Condition: improved  Diet recommendation: cardiac  Filed Weights   07/27/14 1545 07/27/14 2330  Weight: 100.245 kg (221 lb) 103.874 kg (229 lb)    History of present illness:  [name] is a 64 y.o. male h/o NICM, EF 20-25%, on home milrinone, h/o VT with AICD. PAF on amiodarone. Patient presents with multi-system complaints of chest pain, headache, dry cough, diarrhea, fatigue. Worsening palpitations since that time. Chest pain is pleuritic, worse when sitting up, better when lying down flat on his back (which he is doing when I enter the room to examine him).  Hospital Course:  Appears to be a viral URI--- cycle CE, appears to be at baseline  Procedures:    Consultations:  cardiology  Discharge Exam: Filed Vitals:   07/28/14 1525  BP:   Pulse:   Temp: 99.1 F (37.3 C)  Resp:     General: awake, NAD   Discharge Instructions   Discharge Instructions    Diet - low sodium heart healthy    Complete by:  As directed      Increase activity slowly    Complete by:  As directed           Discharge Medication List as of 07/28/2014  7:11 PM    START taking these medications   Details  acetaminophen (TYLENOL) 325 MG tablet Take 2 tablets (650 mg total) by mouth every 4 (four) hours as needed for headache or mild pain., Starting 07/28/2014, Until Discontinued, No Print      CONTINUE these medications which have CHANGED   Details  Oxycodone HCl 20 MG TABS Take 1 tablet (20 mg total)  by mouth 3 (three) times daily as needed (pain.)., Starting 07/28/2014, Until Discontinued, No Print      CONTINUE these medications which have NOT CHANGED   Details  allopurinol (ZYLOPRIM) 100 MG tablet Take 1 tablet (100 mg total) by mouth daily., Starting 02/05/2012, Until Discontinued, Normal    ALPRAZolam (XANAX) 0.5 MG tablet Take 1 tablet (0.5 mg total) by mouth 3 (three) times daily as needed for anxiety., Starting 07/07/2014, Until Discontinued, Print    amiodarone (PACERONE) 200 MG tablet Take 1 tablet (200 mg total) by mouth 2 (two) times daily., Starting 01/15/2014, Until Discontinued, Normal    apixaban (ELIQUIS) 5 MG TABS tablet Take 1 tablet (5 mg total) by mouth 2 (two) times daily., Starting 10/27/2013, Until Discontinued, Normal    carvedilol (COREG) 6.25 MG tablet Take 1 tablet (6.25 mg total) by mouth 2 (two) times daily with a meal., Starting 04/07/2014, Until Discontinued, Normal    diphenhydrAMINE (BENADRYL) 50 MG capsule Take 50 mg by mouth daily as needed for itching., Historical Med    fluticasone (FLONASE) 50 MCG/ACT nasal spray Place 2 sprays into both nostrils daily as needed for allergies., Starting 10/17/2013, Until Discontinued, Normal    HORIZANT 600 MG TBCR Take 600 mg by mouth at bedtime., Starting 07/08/2014, Until Discontinued, Historical Med    magnesium oxide (MAG-OX) 400 MG tablet Take 400 mg by mouth  2 (two) times daily., Until Discontinued, Historical Med    metolazone (ZAROXOLYN) 2.5 MG tablet Take 1 tablet (2.5 mg total) by mouth daily as needed (for fluid retention)., Starting 08/27/2013, Until Discontinued, Normal    milrinone (PRIMACOR) 20 MG/100ML SOLN infusion Inject 0.375 mcg/kg/min into the vein continuous. 440mg  in 48ml infusion 2.4 ml per hour, Until Discontinued, Historical Med    NEXIUM 40 MG capsule Take 1 tablet by mouth daily., Starting 05/31/2014, Until Discontinued, Historical Med    potassium chloride SA (K-DUR,KLOR-CON) 20 MEQ tablet  Take 2 tablets (40 mEq total) by mouth daily., Starting 02/09/2014, Until Discontinued, Normal    ranitidine (ZANTAC) 150 MG capsule Take 150 mg by mouth daily as needed for heartburn (reflux)., Until Discontinued, Historical Med    senna-docusate (SENOKOT-S) 8.6-50 MG per tablet Take 1-2 tablets by mouth 2 (two) times daily as needed for mild constipation or moderate constipation., Starting 10/21/2013, Until Discontinued, Normal    sertraline (ZOLOFT) 100 MG tablet Take 1 tablet (100 mg total) by mouth daily., Starting 05/13/2014, Until Discontinued, Normal    sucralfate (CARAFATE) 1 GM/10ML suspension Take 10 mLs (1 g total) by mouth 4 (four) times daily., Starting 09/09/2013, Until Discontinued, Normal    torsemide (DEMADEX) 20 MG tablet Take 40 mg by mouth 2 (two) times daily. , Until Discontinued, Historical Med    zolpidem (AMBIEN) 5 MG tablet TAKE 1.5 TABLETS BY MOUTH EVERY DAY AT BEDTIME, Print    eplerenone (INSPRA) 25 MG tablet Take 12.5 mg by mouth every other day. , Starting 09/11/2013, Until Discontinued, Historical Med      STOP taking these medications     sildenafil (VIAGRA) 100 MG tablet        Allergies  Allergen Reactions  . Nitroglycerin Other (See Comments)    "feels like head is going to bust open"  . Colchicine Nausea Only      The results of significant diagnostics from this hospitalization (including imaging, microbiology, ancillary and laboratory) are listed below for reference.    Significant Diagnostic Studies: Dg Chest 2 View  07/27/2014   CLINICAL DATA:  Chest pain.  Weight loss.  EXAM: CHEST  2 VIEW  COMPARISON:  03/26/2014  FINDINGS: AICD in the right ventricle and unchanged. Right jugular central venous catheter tip in the SVC also unchanged.  Lungs are clear. No infiltrate effusion or mass. No change from the prior study.  IMPRESSION: No active cardiopulmonary disease.   Electronically Signed   By: Franchot Gallo M.D.   On: 07/27/2014 16:15   Ct Head  Wo Contrast  07/27/2014   CLINICAL DATA:  Shortness of breath. Dizziness, lightheadedness. Nausea, vomiting, diarrhea.  EXAM: CT HEAD WITHOUT CONTRAST  TECHNIQUE: Contiguous axial images were obtained from the base of the skull through the vertex without intravenous contrast.  COMPARISON:  12/10/2013  FINDINGS: There is focal area of encephalomalacia in the left occipital lobe consistent with old infarct. Bilateral basal ganglia calcifications are present. There is no intra or extra-axial fluid collection or mass lesion. The basilar cisterns and ventricles have a normal appearance. There is no CT evidence for acute infarction or hemorrhage. Bone windows are unremarkable.  IMPRESSION: 1. Old left occipital infarct. 2.  No evidence for acute intracranial abnormality.   Electronically Signed   By: Nolon Nations M.D.   On: 07/27/2014 20:58    Microbiology: Recent Results (from the past 240 hour(s))  MRSA PCR Screening     Status: None   Collection Time: 07/27/14  11:31 PM  Result Value Ref Range Status   MRSA by PCR NEGATIVE NEGATIVE Final    Comment:        The GeneXpert MRSA Assay (FDA approved for NASAL specimens only), is one component of a comprehensive MRSA colonization surveillance program. It is not intended to diagnose MRSA infection nor to guide or monitor treatment for MRSA infections.      Labs: Basic Metabolic Panel:  Recent Labs Lab 07/27/14 1551 07/28/14 0959  NA 135 136  K 3.3* 3.2*  CL 101 103  CO2 24 25  GLUCOSE 156* 107*  BUN 31* 26*  CREATININE 1.69* 1.31*  CALCIUM 9.5 9.1   Liver Function Tests: No results for input(s): AST, ALT, ALKPHOS, BILITOT, PROT, ALBUMIN in the last 168 hours. No results for input(s): LIPASE, AMYLASE in the last 168 hours. No results for input(s): AMMONIA in the last 168 hours. CBC:  Recent Labs Lab 07/27/14 1551 07/28/14 0959  WBC 7.4 4.6  NEUTROABS  --  2.4  HGB 15.3 13.5  HCT 43.5 39.3  MCV 89.1 89.1  PLT 166 149*    Cardiac Enzymes:  Recent Labs Lab 07/27/14 2227 07/28/14 0040 07/28/14 0411  TROPONINI 0.07* 0.07* 0.06*   BNP: BNP (last 3 results)  Recent Labs  05/19/14 1634 07/02/14 1010 07/27/14 1928  BNP 537.0* 354.6* 407.5*    ProBNP (last 3 results)  Recent Labs  08/06/13 1245 12/09/13 2250  PROBNP 3225.0* 665.8*    CBG: No results for input(s): GLUCAP in the last 168 hours.     SignedEulogio Bear  [facility] 07/29/2014, 2:37 PM

## 2014-07-30 ENCOUNTER — Other Ambulatory Visit: Payer: Self-pay | Admitting: Family Medicine

## 2014-07-30 DIAGNOSIS — I509 Heart failure, unspecified: Secondary | ICD-10-CM | POA: Diagnosis not present

## 2014-07-30 DIAGNOSIS — I5023 Acute on chronic systolic (congestive) heart failure: Secondary | ICD-10-CM | POA: Diagnosis not present

## 2014-07-30 DIAGNOSIS — R0602 Shortness of breath: Secondary | ICD-10-CM | POA: Diagnosis not present

## 2014-07-30 DIAGNOSIS — R972 Elevated prostate specific antigen [PSA]: Secondary | ICD-10-CM | POA: Diagnosis not present

## 2014-07-31 DIAGNOSIS — I429 Cardiomyopathy, unspecified: Secondary | ICD-10-CM | POA: Diagnosis not present

## 2014-07-31 DIAGNOSIS — I5023 Acute on chronic systolic (congestive) heart failure: Secondary | ICD-10-CM | POA: Diagnosis not present

## 2014-07-31 DIAGNOSIS — I5022 Chronic systolic (congestive) heart failure: Secondary | ICD-10-CM | POA: Diagnosis not present

## 2014-07-31 DIAGNOSIS — Z5181 Encounter for therapeutic drug level monitoring: Secondary | ICD-10-CM | POA: Diagnosis not present

## 2014-07-31 DIAGNOSIS — Z9981 Dependence on supplemental oxygen: Secondary | ICD-10-CM | POA: Diagnosis not present

## 2014-07-31 DIAGNOSIS — Z452 Encounter for adjustment and management of vascular access device: Secondary | ICD-10-CM | POA: Diagnosis not present

## 2014-07-31 DIAGNOSIS — I4891 Unspecified atrial fibrillation: Secondary | ICD-10-CM | POA: Diagnosis not present

## 2014-07-31 DIAGNOSIS — D869 Sarcoidosis, unspecified: Secondary | ICD-10-CM | POA: Diagnosis not present

## 2014-07-31 DIAGNOSIS — I129 Hypertensive chronic kidney disease with stage 1 through stage 4 chronic kidney disease, or unspecified chronic kidney disease: Secondary | ICD-10-CM | POA: Diagnosis not present

## 2014-07-31 DIAGNOSIS — N184 Chronic kidney disease, stage 4 (severe): Secondary | ICD-10-CM | POA: Diagnosis not present

## 2014-07-31 NOTE — Telephone Encounter (Signed)
Rx faxed to pharmacy  

## 2014-07-31 NOTE — Telephone Encounter (Signed)
Last Zolpidem rx 05/08/14. Pt has f/u 08/14/14.  Rx printed and forwarded to PCP for signature.

## 2014-08-03 DIAGNOSIS — I639 Cerebral infarction, unspecified: Secondary | ICD-10-CM

## 2014-08-03 HISTORY — DX: Cerebral infarction, unspecified: I63.9

## 2014-08-06 DIAGNOSIS — R0602 Shortness of breath: Secondary | ICD-10-CM | POA: Diagnosis not present

## 2014-08-06 DIAGNOSIS — I509 Heart failure, unspecified: Secondary | ICD-10-CM | POA: Diagnosis not present

## 2014-08-09 DIAGNOSIS — R079 Chest pain, unspecified: Secondary | ICD-10-CM | POA: Diagnosis not present

## 2014-08-09 DIAGNOSIS — R0602 Shortness of breath: Secondary | ICD-10-CM | POA: Diagnosis not present

## 2014-08-10 DIAGNOSIS — G931 Anoxic brain damage, not elsewhere classified: Secondary | ICD-10-CM | POA: Diagnosis not present

## 2014-08-10 DIAGNOSIS — I378 Other nonrheumatic pulmonary valve disorders: Secondary | ICD-10-CM | POA: Diagnosis not present

## 2014-08-10 DIAGNOSIS — K5900 Constipation, unspecified: Secondary | ICD-10-CM | POA: Diagnosis not present

## 2014-08-10 DIAGNOSIS — D869 Sarcoidosis, unspecified: Secondary | ICD-10-CM | POA: Diagnosis not present

## 2014-08-10 DIAGNOSIS — I5023 Acute on chronic systolic (congestive) heart failure: Secondary | ICD-10-CM | POA: Diagnosis not present

## 2014-08-10 DIAGNOSIS — I639 Cerebral infarction, unspecified: Secondary | ICD-10-CM | POA: Diagnosis not present

## 2014-08-10 DIAGNOSIS — I348 Other nonrheumatic mitral valve disorders: Secondary | ICD-10-CM | POA: Diagnosis not present

## 2014-08-10 DIAGNOSIS — N184 Chronic kidney disease, stage 4 (severe): Secondary | ICD-10-CM | POA: Diagnosis not present

## 2014-08-10 DIAGNOSIS — N179 Acute kidney failure, unspecified: Secondary | ICD-10-CM | POA: Diagnosis not present

## 2014-08-10 DIAGNOSIS — I509 Heart failure, unspecified: Secondary | ICD-10-CM | POA: Diagnosis not present

## 2014-08-10 DIAGNOSIS — R4182 Altered mental status, unspecified: Secondary | ICD-10-CM | POA: Diagnosis not present

## 2014-08-10 DIAGNOSIS — I472 Ventricular tachycardia: Secondary | ICD-10-CM | POA: Diagnosis not present

## 2014-08-10 DIAGNOSIS — A419 Sepsis, unspecified organism: Secondary | ICD-10-CM | POA: Diagnosis not present

## 2014-08-10 DIAGNOSIS — K219 Gastro-esophageal reflux disease without esophagitis: Secondary | ICD-10-CM | POA: Diagnosis not present

## 2014-08-10 DIAGNOSIS — R310 Gross hematuria: Secondary | ICD-10-CM | POA: Diagnosis not present

## 2014-08-10 DIAGNOSIS — R4701 Aphasia: Secondary | ICD-10-CM | POA: Diagnosis not present

## 2014-08-10 DIAGNOSIS — R440 Auditory hallucinations: Secondary | ICD-10-CM | POA: Diagnosis not present

## 2014-08-10 DIAGNOSIS — I4892 Unspecified atrial flutter: Secondary | ICD-10-CM | POA: Diagnosis not present

## 2014-08-10 DIAGNOSIS — I6781 Acute cerebrovascular insufficiency: Secondary | ICD-10-CM | POA: Diagnosis not present

## 2014-08-10 DIAGNOSIS — E785 Hyperlipidemia, unspecified: Secondary | ICD-10-CM | POA: Diagnosis not present

## 2014-08-10 DIAGNOSIS — R079 Chest pain, unspecified: Secondary | ICD-10-CM | POA: Diagnosis not present

## 2014-08-10 DIAGNOSIS — E1122 Type 2 diabetes mellitus with diabetic chronic kidney disease: Secondary | ICD-10-CM | POA: Diagnosis not present

## 2014-08-10 DIAGNOSIS — J9601 Acute respiratory failure with hypoxia: Secondary | ICD-10-CM | POA: Diagnosis not present

## 2014-08-10 DIAGNOSIS — M109 Gout, unspecified: Secondary | ICD-10-CM | POA: Diagnosis not present

## 2014-08-10 DIAGNOSIS — Z9989 Dependence on other enabling machines and devices: Secondary | ICD-10-CM | POA: Diagnosis not present

## 2014-08-10 DIAGNOSIS — A411 Sepsis due to other specified staphylococcus: Secondary | ICD-10-CM | POA: Diagnosis not present

## 2014-08-10 DIAGNOSIS — E871 Hypo-osmolality and hyponatremia: Secondary | ICD-10-CM | POA: Diagnosis not present

## 2014-08-10 DIAGNOSIS — R0602 Shortness of breath: Secondary | ICD-10-CM | POA: Diagnosis not present

## 2014-08-10 DIAGNOSIS — I5043 Acute on chronic combined systolic (congestive) and diastolic (congestive) heart failure: Secondary | ICD-10-CM | POA: Diagnosis not present

## 2014-08-10 DIAGNOSIS — J9811 Atelectasis: Secondary | ICD-10-CM | POA: Diagnosis not present

## 2014-08-10 DIAGNOSIS — R443 Hallucinations, unspecified: Secondary | ICD-10-CM | POA: Diagnosis not present

## 2014-08-10 DIAGNOSIS — I252 Old myocardial infarction: Secondary | ICD-10-CM | POA: Diagnosis not present

## 2014-08-10 DIAGNOSIS — I130 Hypertensive heart and chronic kidney disease with heart failure and stage 1 through stage 4 chronic kidney disease, or unspecified chronic kidney disease: Secondary | ICD-10-CM | POA: Diagnosis not present

## 2014-08-10 DIAGNOSIS — I358 Other nonrheumatic aortic valve disorders: Secondary | ICD-10-CM | POA: Diagnosis not present

## 2014-08-10 DIAGNOSIS — E875 Hyperkalemia: Secondary | ICD-10-CM | POA: Diagnosis not present

## 2014-08-10 DIAGNOSIS — R0600 Dyspnea, unspecified: Secondary | ICD-10-CM | POA: Diagnosis not present

## 2014-08-10 DIAGNOSIS — I672 Cerebral atherosclerosis: Secondary | ICD-10-CM | POA: Diagnosis not present

## 2014-08-10 DIAGNOSIS — I119 Hypertensive heart disease without heart failure: Secondary | ICD-10-CM | POA: Diagnosis not present

## 2014-08-10 DIAGNOSIS — I4891 Unspecified atrial fibrillation: Secondary | ICD-10-CM | POA: Diagnosis not present

## 2014-08-10 DIAGNOSIS — I480 Paroxysmal atrial fibrillation: Secondary | ICD-10-CM | POA: Diagnosis not present

## 2014-08-10 DIAGNOSIS — I428 Other cardiomyopathies: Secondary | ICD-10-CM | POA: Diagnosis not present

## 2014-08-11 ENCOUNTER — Encounter: Payer: Self-pay | Admitting: Internal Medicine

## 2014-08-13 DIAGNOSIS — I509 Heart failure, unspecified: Secondary | ICD-10-CM | POA: Diagnosis not present

## 2014-08-13 DIAGNOSIS — R0602 Shortness of breath: Secondary | ICD-10-CM | POA: Diagnosis not present

## 2014-08-14 ENCOUNTER — Ambulatory Visit: Payer: Medicare Other | Admitting: Family

## 2014-08-20 ENCOUNTER — Other Ambulatory Visit (HOSPITAL_COMMUNITY): Payer: Self-pay | Admitting: Cardiology

## 2014-08-25 ENCOUNTER — Encounter (HOSPITAL_COMMUNITY): Payer: Self-pay

## 2014-08-26 DIAGNOSIS — R0602 Shortness of breath: Secondary | ICD-10-CM | POA: Diagnosis not present

## 2014-08-26 DIAGNOSIS — I509 Heart failure, unspecified: Secondary | ICD-10-CM | POA: Diagnosis not present

## 2014-08-27 ENCOUNTER — Telehealth: Payer: Self-pay | Admitting: Family

## 2014-08-27 NOTE — Telephone Encounter (Signed)
Received call from [facility] with [facility] to give a d/c update - call dropped - pt dc /23 from [facility]

## 2014-08-28 ENCOUNTER — Telehealth: Payer: Self-pay | Admitting: Family

## 2014-08-28 NOTE — Telephone Encounter (Signed)
I called the patient's home and his mother said he was still in the hospital in [facility]. She will have the patient call when he is home.     KP

## 2014-08-28 NOTE — Telephone Encounter (Signed)
Lyndee Leo from [facility] called in to inform us that the pt was discharged from [facility] on 8/23  with a   Dx: Unspecified atrial fibulation.

## 2014-08-28 NOTE — Telephone Encounter (Signed)
I have tried to contact the patient, MSG left to call the office    KP

## 2014-08-29 DIAGNOSIS — K5790 Diverticulosis of intestine, part unspecified, without perforation or abscess without bleeding: Secondary | ICD-10-CM | POA: Diagnosis not present

## 2014-08-29 DIAGNOSIS — Z95810 Presence of automatic (implantable) cardiac defibrillator: Secondary | ICD-10-CM | POA: Diagnosis not present

## 2014-08-29 DIAGNOSIS — Z950 Presence of cardiac pacemaker: Secondary | ICD-10-CM | POA: Diagnosis not present

## 2014-08-29 DIAGNOSIS — Z8673 Personal history of transient ischemic attack (TIA), and cerebral infarction without residual deficits: Secondary | ICD-10-CM | POA: Diagnosis not present

## 2014-08-29 DIAGNOSIS — R109 Unspecified abdominal pain: Secondary | ICD-10-CM | POA: Diagnosis not present

## 2014-08-29 DIAGNOSIS — I252 Old myocardial infarction: Secondary | ICD-10-CM | POA: Diagnosis not present

## 2014-08-29 DIAGNOSIS — I4891 Unspecified atrial fibrillation: Secondary | ICD-10-CM | POA: Diagnosis not present

## 2014-08-29 DIAGNOSIS — K5730 Diverticulosis of large intestine without perforation or abscess without bleeding: Secondary | ICD-10-CM | POA: Diagnosis not present

## 2014-08-29 DIAGNOSIS — K2970 Gastritis, unspecified, without bleeding: Secondary | ICD-10-CM | POA: Diagnosis not present

## 2014-08-29 DIAGNOSIS — I509 Heart failure, unspecified: Secondary | ICD-10-CM | POA: Diagnosis not present

## 2014-08-29 DIAGNOSIS — I10 Essential (primary) hypertension: Secondary | ICD-10-CM | POA: Diagnosis not present

## 2014-08-29 DIAGNOSIS — R1013 Epigastric pain: Secondary | ICD-10-CM | POA: Diagnosis not present

## 2014-09-02 NOTE — Telephone Encounter (Signed)
Spoke with male at home residence that said pt was still in the city and would not be home for a few days.  Left message on cell# to return my call.

## 2014-09-03 DIAGNOSIS — R0602 Shortness of breath: Secondary | ICD-10-CM | POA: Diagnosis not present

## 2014-09-03 DIAGNOSIS — I509 Heart failure, unspecified: Secondary | ICD-10-CM | POA: Diagnosis not present

## 2014-09-08 ENCOUNTER — Telehealth (HOSPITAL_COMMUNITY): Payer: Self-pay | Admitting: Vascular Surgery

## 2014-09-08 ENCOUNTER — Other Ambulatory Visit: Payer: Self-pay

## 2014-09-08 ENCOUNTER — Encounter: Payer: Self-pay | Admitting: Internal Medicine

## 2014-09-08 NOTE — Telephone Encounter (Signed)
Pt sch for appt Fri 9/9

## 2014-09-08 NOTE — Telephone Encounter (Signed)
Pt called he wants a ASAP f/u appt he has been in the hospital 3 week in [place].. Please advise

## 2014-09-09 ENCOUNTER — Encounter: Payer: Self-pay | Admitting: Internal Medicine

## 2014-09-09 ENCOUNTER — Telehealth: Payer: Self-pay | Admitting: *Deleted

## 2014-09-09 ENCOUNTER — Telehealth (HOSPITAL_COMMUNITY): Payer: Self-pay | Admitting: *Deleted

## 2014-09-09 DIAGNOSIS — F419 Anxiety disorder, unspecified: Secondary | ICD-10-CM

## 2014-09-09 DIAGNOSIS — I5022 Chronic systolic (congestive) heart failure: Secondary | ICD-10-CM

## 2014-09-09 MED ORDER — ALPRAZOLAM 0.5 MG PO TABS: 0.5000 mg | ORAL_TABLET | Freq: Three times a day (TID) | ORAL | Status: DC | PRN

## 2014-09-09 NOTE — Telephone Encounter (Signed)
Per [facility] they need orders to restart pt's [facility] care as pt was out of town, orders placed

## 2014-09-09 NOTE — Telephone Encounter (Signed)
Received fax for refill of alprazolam 0.5mg , #60. Last refill 07/2014.  Last UDS 01/29/14 and is due for repeat UDS.  Pt has f/u on 09/11/14 and will obtain UDS at that visit.  Rx called to pharmacy voicemail.

## 2014-09-10 DIAGNOSIS — I509 Heart failure, unspecified: Secondary | ICD-10-CM | POA: Diagnosis not present

## 2014-09-10 DIAGNOSIS — I5022 Chronic systolic (congestive) heart failure: Secondary | ICD-10-CM | POA: Diagnosis not present

## 2014-09-10 DIAGNOSIS — R0602 Shortness of breath: Secondary | ICD-10-CM | POA: Diagnosis not present

## 2014-09-11 ENCOUNTER — Ambulatory Visit (HOSPITAL_COMMUNITY)
Admission: RE | Admit: 2014-09-11 | Discharge: 2014-09-11 | Disposition: A | Discharge status: Home / Self Care | Payer: Medicare Other | Source: Ambulatory Visit | Attending: Cardiology | Admitting: Cardiology

## 2014-09-11 ENCOUNTER — Ambulatory Visit (INDEPENDENT_AMBULATORY_CARE_PROVIDER_SITE_OTHER): Payer: Medicare Other | Admitting: Family

## 2014-09-11 ENCOUNTER — Encounter (HOSPITAL_COMMUNITY): Payer: Self-pay

## 2014-09-11 ENCOUNTER — Encounter: Payer: Self-pay | Admitting: Family

## 2014-09-11 VITALS — BP 99/73 | HR 92 | Resp 22 | Wt 224.0 lb

## 2014-09-11 VITALS — BP 110/72 | HR 87 | Temp 97.8°F | Resp 16 | Ht 75.0 in

## 2014-09-11 DIAGNOSIS — N183 Chronic kidney disease, stage 3 (moderate): Secondary | ICD-10-CM | POA: Insufficient documentation

## 2014-09-11 DIAGNOSIS — I472 Ventricular tachycardia, unspecified: Secondary | ICD-10-CM

## 2014-09-11 DIAGNOSIS — Z23 Encounter for immunization: Secondary | ICD-10-CM

## 2014-09-11 DIAGNOSIS — R319 Hematuria, unspecified: Secondary | ICD-10-CM | POA: Diagnosis not present

## 2014-09-11 DIAGNOSIS — I480 Paroxysmal atrial fibrillation: Secondary | ICD-10-CM | POA: Diagnosis not present

## 2014-09-11 DIAGNOSIS — Z8673 Personal history of transient ischemic attack (TIA), and cerebral infarction without residual deficits: Secondary | ICD-10-CM | POA: Diagnosis not present

## 2014-09-11 DIAGNOSIS — R34 Anuria and oliguria: Secondary | ICD-10-CM | POA: Diagnosis not present

## 2014-09-11 DIAGNOSIS — I639 Cerebral infarction, unspecified: Secondary | ICD-10-CM | POA: Insufficient documentation

## 2014-09-11 DIAGNOSIS — R140 Abdominal distension (gaseous): Secondary | ICD-10-CM

## 2014-09-11 DIAGNOSIS — R972 Elevated prostate specific antigen [PSA]: Secondary | ICD-10-CM

## 2014-09-11 DIAGNOSIS — I5022 Chronic systolic (congestive) heart failure: Secondary | ICD-10-CM | POA: Insufficient documentation

## 2014-09-11 LAB — URINALYSIS, ROUTINE W REFLEX MICROSCOPIC
Bilirubin Urine: NEGATIVE
HGB URINE DIPSTICK: NEGATIVE
Ketones, ur: NEGATIVE
Leukocytes, UA: NEGATIVE
Nitrite: NEGATIVE
PH: 5.5 (ref 5.0–8.0)
Specific Gravity, Urine: 1.015 (ref 1.000–1.030)
Total Protein, Urine: 30 — AB
Urine Glucose: NEGATIVE
Urobilinogen, UA: 0.2 (ref 0.0–1.0)

## 2014-09-11 LAB — CBC WITH DIFFERENTIAL/PLATELET
HCT: 39.8 % (ref 39.0–52.0)
Hemoglobin: 13.3 g/dL (ref 13.0–17.0)
MCHC: 33.4 g/dL (ref 30.0–36.0)
MCV: 90.4 fl (ref 78.0–100.0)
PLATELETS: 235 10*3/uL (ref 150.0–400.0)
RBC: 4.4 Mil/uL (ref 4.22–5.81)
RDW: 15.5 % (ref 11.5–15.5)
WBC: 4 10*3/uL (ref 4.0–10.5)

## 2014-09-11 LAB — COMPREHENSIVE METABOLIC PANEL
ALBUMIN: 3.6 g/dL (ref 3.5–5.2)
ALK PHOS: 85 U/L (ref 39–117)
ALT: 20 U/L (ref 0–53)
AST: 31 U/L (ref 0–37)
BILIRUBIN TOTAL: 0.5 mg/dL (ref 0.2–1.2)
BUN: 25 mg/dL — AB (ref 6–23)
CO2: 27 mEq/L (ref 19–32)
CREATININE: 1.6 mg/dL — AB (ref 0.40–1.50)
Calcium: 9.6 mg/dL (ref 8.4–10.5)
Chloride: 104 mEq/L (ref 96–112)
GFR: 56.13 mL/min — ABNORMAL LOW (ref >60.00)
Glucose, Bld: 110 mg/dL — ABNORMAL HIGH (ref 70–99)
Potassium: 3.1 mEq/L — ABNORMAL LOW (ref 3.5–5.1)
SODIUM: 142 meq/L (ref 135–145)
TOTAL PROTEIN: 7.5 g/dL (ref 6.0–8.3)

## 2014-09-11 LAB — BRAIN NATRIURETIC PEPTIDE: B Natriuretic Peptide: 262.5 pg/mL — ABNORMAL HIGH (ref 0.0–100.0)

## 2014-09-11 MED ORDER — TORSEMIDE 20 MG PO TABS: 40.0000 mg | ORAL_TABLET | Freq: Two times a day (BID) | ORAL | Status: DC

## 2014-09-11 NOTE — Progress Notes (Signed)
Pre visit review using our clinic review tool, if applicable. No additional management support is needed unless otherwise documented below in the visit note. 

## 2014-09-11 NOTE — Assessment & Plan Note (Signed)
I would like to add flomax due to his urinary concerns, however I am afraid of him developing orthostatic hypotension with his other meds if we add flomax.  Obtain UA and culture to ensure resolution of UTI. He reports he had GI upset with abx given by the hospital and did not finish.

## 2014-09-11 NOTE — Progress Notes (Signed)
Subjective:    Patient ID: [name], male    DOB: January 09, 1950, 64 y.o.   MRN: [id number]  HPI  [name] is a 64 yr old male who presents today for hospital follow up.  Was hospitalized 8/8- 08/22/14.  Was in [place].  Reports that he developed hallucination,  Not breathing well, was urinating blood. In [place].  He was in the ICU x 2 weeks.  Reports that he suffered a CVA following ? Angiogram to check lower extremity blood flow. Reports that he was in and out of A fib.  Reports that his walking "still ain't as sturdy" as it was. Reports that he continues to have dizziness since the CVA.  Reports SOB is unchanged, but becomes winded with minimal activity. He continues to have chronic abdominal pain- unchanged. He was told that he had hematuria for bladder infection and was treated with abx.  Reports that he has some bladder pressure and has to "push" to empty his bladder.    He reports that they stopped his Eliquis and is is unsure if he needs to restart.    Review of Systems See HPI  Past Medical History  Diagnosis Date  . CHF (congestive heart failure)   . Sarcoidosis   . Cardiomyopathy, dilated, nonischemic     non ischemic by cath  . Acute on chronic systolic heart failure   . Automatic implantable cardiac defibrillator in situ   . Atrial fibrillation   . NSVT (nonsustained ventricular tachycardia)   . GERD (gastroesophageal reflux disease)   . Hypercholesteremia   . Shortness of breath   . Chronic kidney disease (CKD), stage III (moderate)   . Pacemaker   . Anginal pain   . Gout   . Hypertension     dr [name]  . Coronary artery disease   . Elevated PSA 06/24/2013  . Myocardial infarction   . Headache   . Neuropathy     Social History   Social History  . Marital Status: Single    Spouse Name: N/A  . Number of Children: N/A  . Years of Education: N/A   Occupational History  . Not on file.   Social History Main Topics  . Smoking  status: Never Smoker   . Smokeless tobacco: Never Used  . Alcohol Use: No  . Drug Use: Yes    Special: "Crack" cocaine, Cocaine     Comment: last use august 2004  . Sexual Activity: Not Currently   Other Topics Concern  . Not on file   Social History Narrative   3 sons- one in [place], 1 in [place], on in [place]   Retired from post office- on disability   Completed Bachelors from [employer]    Past Surgical History  Procedure Laterality Date  . Pacemaker insertion  2009    with ICD  . Tee without cardioversion  01/17/2011    Procedure: TRANSESOPHAGEAL ECHOCARDIOGRAM (TEE);  Surgeon: Birdie Riddle, MD;  Location: [place];  Service: Cardiovascular;  Laterality: N/A;  . Cardioversion  01/17/2011    Procedure: CARDIOVERSION;  Surgeon: Birdie Riddle, MD;  Location: [place];  Service: Cardiovascular;  Laterality: N/A;  . Cardiac catheterization    . Insert / replace / remove pacemaker    . Anterior cervical decomp/discectomy fusion  08/21/2011    Procedure: ANTERIOR CERVICAL DECOMPRESSION/DISCECTOMY FUSION 2 LEVELS;  Surgeon: Marybelle Killings, MD;  Location: [place];  Service: Orthopedics;  Laterality: N/A;  C5-6,  C6-7 Anterior Cervical Discectomy and Fusion, allograft, plate  . Tee without cardioversion N/A 02/16/2012    Procedure: TRANSESOPHAGEAL ECHOCARDIOGRAM (TEE);  Surgeon: Jolaine Artist, MD;  Location: [facility] ENDOSCOPY;  Service: Cardiovascular;  Laterality: N/A;  original case scheduled under his dad (who is deceased), rescheduled under correct mrn/pt/dob. [place]/dl  . Tee without cardioversion N/A 03/22/2012    Procedure: TRANSESOPHAGEAL ECHOCARDIOGRAM (TEE);  Surgeon: Lelon Perla, MD;  Location: [place];  Service: Cardiovascular;  Laterality: N/A;  . Cardioversion N/A 03/22/2012    Procedure: CARDIOVERSION;  Surgeon: Lelon Perla, MD;  Location: [facility] ENDOSCOPY;  Service: Cardiovascular;  Laterality: N/A;  . Cardioversion N/A 04/26/2012    Procedure: CARDIOVERSION;  Surgeon: Jolaine Artist, MD;  Location: [facility] ENDOSCOPY;  Service: Cardiovascular;  Laterality: N/A;  . Central venous catheter tunneled insertion single lumen  09/16/2012    right IJ  . Cardioversion N/A 11/08/2012    Procedure: CARDIOVERSION;  Surgeon: Jolaine Artist, MD;  Location: [facility] ENDOSCOPY;  Service: Cardiovascular;  Laterality: N/A;  . Back surgery  1987, lower    Ruptured disk repair  . Peripherally inserted central catheter insertion  2013  . Right heart catheterization N/A 01/26/2012    Procedure: RIGHT HEART CATH;  Surgeon: Jolaine Artist, MD;  Location: [facility] CATH LAB;  Service: Cardiovascular;  Laterality: N/A;  . Right heart catheterization N/A 04/30/2013    Procedure: RIGHT HEART CATH;  Surgeon: Jolaine Artist, MD;  Location: [name] CATH LAB;  Service: Cardiovascular;  Laterality: N/A;    Family History  Problem Relation Age of Onset  . Heart disease    . Heart failure    . Stroke    . Anesthesia problems Neg Hx   . Hypotension Neg Hx   . Malignant hyperthermia Neg Hx   . Pseudochol deficiency Neg Hx   . Heart failure Sister   . Heart attack Sister   . Hyperlipidemia Mother     Allergies  Allergen Reactions  . Nitroglycerin Other (See Comments)    "feels like head is going to bust open"  . Colchicine Nausea Only    Current Outpatient Prescriptions on File Prior to Visit  Medication Sig Dispense Refill  . acetaminophen (TYLENOL) 325 MG tablet Take 2 tablets (650 mg total) by mouth every 4 (four) hours as needed for headache or mild pain.    Marland Kitchen allopurinol (ZYLOPRIM) 100 MG tablet Take 1 tablet (100 mg total) by mouth daily. 30 tablet 6  . ALPRAZolam (XANAX) 0.5 MG tablet Take 1 tablet (0.5 mg total) by mouth 3 (three) times daily as needed for anxiety. 60 tablet 0  . amiodarone (PACERONE) 200 MG tablet Take 1 tablet (200 mg total) by mouth 2 (two) times daily. 60 tablet 6  . apixaban (ELIQUIS) 5 MG TABS tablet Take 1 tablet (5 mg total) by mouth 2 (two) times daily. 60 tablet  4  . carvedilol (COREG) 6.25 MG tablet TAKE 1 TABLET (6.25 MG TOTAL) BY MOUTH 2 (TWO) TIMES DAILY WITH A MEAL. 60 tablet 3  . diphenhydrAMINE (BENADRYL) 50 MG capsule Take 50 mg by mouth daily as needed for itching.    Marland Kitchen eplerenone (INSPRA) 25 MG tablet Take 12.5 mg by mouth every other day.     . fluticasone (FLONASE) 50 MCG/ACT nasal spray Place 2 sprays into both nostrils daily as needed for allergies. 16 g 2  . HORIZANT 600 MG TBCR Take 600 mg by mouth at bedtime.    . magnesium  oxide (MAG-OX) 400 MG tablet Take 400 mg by mouth 2 (two) times daily.    . metolazone (ZAROXOLYN) 2.5 MG tablet Take 1 tablet (2.5 mg total) by mouth daily as needed (for fluid retention). 10 tablet 6  . milrinone (PRIMACOR) 20 MG/100ML SOLN infusion Inject 0.375 mcg/kg/min into the vein continuous. 440mg  in 454ml infusion 2.4 ml per hour    . NEXIUM 40 MG capsule Take 1 tablet by mouth daily.    . Oxycodone HCl 20 MG TABS Take 1 tablet (20 mg total) by mouth 3 (three) times daily as needed (pain.). 15 tablet 0  . potassium chloride SA (K-DUR,KLOR-CON) 20 MEQ tablet Take 2 tablets (40 mEq total) by mouth daily. 60 tablet 6  . ranitidine (ZANTAC) 150 MG capsule Take 150 mg by mouth daily as needed for heartburn (reflux).    Marland Kitchen senna-docusate (SENOKOT-S) 8.6-50 MG per tablet Take 1-2 tablets by mouth 2 (two) times daily as needed for mild constipation or moderate constipation. 60 tablet 3  . sertraline (ZOLOFT) 100 MG tablet Take 1 tablet (100 mg total) by mouth daily. 90 tablet 1  . sucralfate (CARAFATE) 1 GM/10ML suspension Take 10 mLs (1 g total) by mouth 4 (four) times daily. (Patient taking differently: Take 1 g by mouth 4 (four) times daily as needed (for indigestion). ) 420 mL 1  . torsemide (DEMADEX) 20 MG tablet Take 40 mg by mouth 2 (two) times daily.     Marland Kitchen zolpidem (AMBIEN) 5 MG tablet TAKE 1 AND 1/2 TABLETS BY MOUTH EVERY DAY AT BEDTIME 45 tablet 0   No current facility-administered medications on file prior  to visit.    BP 110/72 mmHg  Pulse 87  Temp(Src) 97.8 F (36.6 C) (Oral)  Resp 16  Ht 6\' 3"  (1.905 m)  SpO2 98%       Objective:   Physical Exam  Constitutional: He is oriented to person, place, and time. He appears well-developed and well-nourished. No distress.  HENT:  Head: Normocephalic and atraumatic.  Eyes: Pupils are equal, round, and reactive to light.  Cardiovascular: Normal rate and regular rhythm.   No murmur heard. Pulmonary/Chest: Effort normal and breath sounds normal. No respiratory distress. He has no wheezes. He has no rales.  Musculoskeletal: He exhibits no edema.  Neurological: He is alert and oriented to person, place, and time.  RUE 4-5/5 strength LUE 5/5 strength RLE 4-5/5 strength RUE 5/5 strength  + facial symmetry, speech is clear  EOM intact  Skin: Skin is warm and dry.  Psychiatric: He has a normal mood and affect. His behavior is normal. Thought content normal.          Assessment & Plan:

## 2014-09-11 NOTE — Progress Notes (Signed)
Advanced Heart Failure Medication Review by a Pharmacist  Does the patient  feel that his/her medications are working for him/her?  yes  Has the patient been experiencing any side effects to the medications prescribed?  no  Does the patient measure his/her own blood pressure or blood glucose at home?  no   Does the patient have any problems obtaining medications due to transportation or finances?   no  Understanding of regimen: good Understanding of indications: good Potential of compliance: good    Pharmacist comments:  [name] is a pleasant 64 yo M presenting with a recent hospital discharge medication list. He states that his Eliquis was discontinued during his last hospital admission for significant hematuria so he has not been taking it for at least 1 month. All discrepancies were reconciled.   [name]. [name], PharmD, BCPS, CPP Clinical Pharmacist Pager: [phone] Phone: [phone] 09/11/2014 10:16 AM

## 2014-09-11 NOTE — Patient Instructions (Addendum)
INCREASE Torsemide to 40 mg twice daily. Pharmacy: [id number] [address], [place] [phone]  Will refer you to [facility]. Address: [address], [place], [place] [address]  Phone: [phone]  Will schedule you for abdominal ultrasound at [facility] to evaluate distended abdomen.  Routine lab work today. Will notify you of abnormal results, otherwise no news is good news!  Follow up 2 weeks.  Do the following things EVERYDAY: 1) Weigh yourself in the morning before breakfast. Write it down and keep it in a log. 2) Take your medicines as prescribed 3) Eat low salt foods-Limit salt (sodium) to 2000 mg per day.  4) Stay as active as you can everyday 5) Limit all fluids for the day to less than 2 liters

## 2014-09-11 NOTE — Patient Instructions (Signed)
Please complete lab work prior to leaving. Follow up with cardiology as scheduled.  Follow up with [place] in 3  months, sooner if problems/concerns.

## 2014-09-11 NOTE — Assessment & Plan Note (Signed)
Patient's Eliquis was d/c'd during his hospitalization (? Presumably due to hematuria). Will defer resuming Eliquis to cardiology (he has appointment with them this AM).

## 2014-09-11 NOTE — Assessment & Plan Note (Signed)
Pt reports + CVA while admitted.  We have requested his hospital records.  He has some mild R sided weakness and gait instability. Will refer for home health PT for gait training.  Will also refer to neurology.

## 2014-09-12 ENCOUNTER — Telehealth: Payer: Self-pay | Admitting: Family

## 2014-09-12 DIAGNOSIS — E876 Hypokalemia: Secondary | ICD-10-CM

## 2014-09-12 LAB — URINE CULTURE
COLONY COUNT: NO GROWTH
ORGANISM ID, BACTERIA: NO GROWTH

## 2014-09-12 NOTE — Telephone Encounter (Signed)
Potassium is low. Confirm he is taking kdur 40 meq daily and if so, advise pt to increase K dur to 40 meq bid x 3 days, then repeat bmet dx hypokalemia.

## 2014-09-13 NOTE — Progress Notes (Signed)
Patient ID: SHANT HENCE, male   DOB: 01-27-1950, 64 y.o.   MRN: [id number] PCP: Debbrah Alar   HPI: Mr. Rappaport is a 64 y.o. 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 He also has history of ventricular tachycardia s/p St Jude ICD (single lead, no Corevue) by Dr. Lovena Le, CKD stage IV, and paroxysmal atrial arrhythmias on amiodarone as well as sarcoidosis and hypertension. Cath in 2008 by Dr. Terrence Dupont showed no CAD.  Seen at [facility] and not felt to be a transplant candidate due to renal failure and lack of family support.  Milrinone initiated in January 2014 for low output. 03/01/12 milrinone increased 0.375 mcg/kg/min.   04/26/12 S/P Successful DC-CV for atrial fibrillation. Remains on amiodarone.   Admitted to [facility] 01/09/14-01/15/2014 with an infected PICC. Gram negative bacteremia with antibiotic course. Hospitalization was complicated by VT/VF. Discharge weight was 240 pounds.   Since last appointment here, he was admitted to the hospital in [facility] for 3 weeks in 8/16.  He was initially admitted with chest pain, dyspnea, and abdominal pain.  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  His stay was complicated by delirium with hallucinations.  He had hematuria and Eliquis was stopped.  He says they told him that he had a bladder infection.  Not currently having hematuria.  By the end of his hospitalization, he was very debilitated and barely able to walk.  He is now back at home.    He remains on Milrinone 0.375 mcg/kg/min.  He has lost 8 lbs.  He still has periodic chest pain.  He has dyspnea after walking 50 yards or going up a hill or incline.   He remains off Eliquis.   ECHO 03/22/12 EF 15%  Echo 6/15: EF 20-25%  RHC 04/30/13 RA = 4  RV = 20/1/3  PA = 23/8 (15)  PCW = 5  Fick cardiac output/index =4.5/1.9  Thermo CO/CI = 3.9/1.6   PVR =  FA sat = 98%  PA sat = 62%, 64% - On milrinone 0.25  He is not on an ace inhibitor due to elevated creatinine. Not on nitrates due to Viagra use.   Labs:  08/15/12 Creatinine 4.24 Potassium 3.3 09/19/12 Creatinine 3.98, Potassium 3.1, BUN 78 10/07/12 Creatinine 2.62, Potassium 3.4 10/18/12 Creatinine 3.3, K+3.1 11/08/12 Creatinine 2.15 K 4.0 11/25/12 Creatinine 2.93, K+ 3.5 03/10/13: K+ 2.9, Creatinine 2.63 04/30/13: K+ 3.7, creatinine 2.4 6/15: K 3, creatinine 3.6 09/11/13: K 3.1 Cr 1.87  10/15: K 3.8, creatinine 2.13, HCT 40.7 11/06/13 K 4.0 Creatinine 1.58  12/17/13 K 3.6 Creatinine 2.3  3/16 K 3.9, creatinine 2.02 04/2014: K 3.4 => 3.7,  Creatinine 2.37 => 2.02, TSH/free T4/free T3 normal, AST 45, ALT 40 05/19/2014: L 4.0 Creatinine 1.51 Mag 2.2 BNP 53 9/16: K 3.2, creatinine 1.37, hgb 11.7, plts 196  ECG: ST at 102, LAFB  ROS: All systems negative except as listed in HPI, PMH and Problem List.  Past Medical History  Diagnosis Date  . CHF (congestive heart failure)   . Sarcoidosis   . Cardiomyopathy, dilated, nonischemic     non ischemic by cath  . Acute on chronic systolic heart failure   . Automatic implantable cardiac defibrillator in situ   . Atrial fibrillation   . NSVT (nonsustained ventricular tachycardia)   . GERD (gastroesophageal reflux disease)   . Hypercholesteremia   .  Shortness of breath   . Chronic kidney disease (CKD), stage III (moderate)   . Pacemaker   . Anginal pain   . Gout   . Hypertension     dr [name]  . Coronary artery disease   . Elevated PSA 06/24/2013  . Myocardial infarction   . Headache   . Neuropathy    Current Outpatient Prescriptions  Medication Sig Dispense Refill  . allopurinol (ZYLOPRIM) 100 MG tablet Take 1 tablet (100 mg total) by mouth daily. 30 tablet 6  . ALPRAZolam (XANAX) 0.5 MG tablet Take 1 tablet (0.5 mg total) by mouth 3 (three) times daily as needed for anxiety. 60 tablet 0  . amiodarone (PACERONE) 200 MG  tablet Take 1 tablet (200 mg total) by mouth 2 (two) times daily. 60 tablet 6  . carvedilol (COREG) 6.25 MG tablet TAKE 1 TABLET (6.25 MG TOTAL) BY MOUTH 2 (TWO) TIMES DAILY WITH A MEAL. 60 tablet 3  . diphenhydrAMINE (BENADRYL) 50 MG capsule Take 50 mg by mouth daily as needed for itching.    Marland Kitchen eplerenone (INSPRA) 25 MG tablet Take 12.5 mg by mouth every other day.     . fluticasone (FLONASE) 50 MCG/ACT nasal spray Place 2 sprays into both nostrils daily as needed for allergies. 16 g 2  . HORIZANT 600 MG TBCR Take 600 mg by mouth at bedtime.    . magnesium oxide (MAG-OX) 400 MG tablet Take 400 mg by mouth 2 (two) times daily.    . milrinone (PRIMACOR) 20 MG/100ML SOLN infusion Inject 0.375 mcg/kg/min into the vein continuous. 440mg  in 496ml infusion 2.4 ml per hour    . Oxycodone HCl 20 MG TABS Take 1 tablet (20 mg total) by mouth 3 (three) times daily as needed (pain.). 15 tablet 0  . potassium chloride SA (K-DUR,KLOR-CON) 20 MEQ tablet Take 2 tablets (40 mEq total) by mouth daily. 60 tablet 6  . ranitidine (ZANTAC) 150 MG capsule Take 150 mg by mouth daily as needed for heartburn (reflux).    Marland Kitchen senna-docusate (SENOKOT-S) 8.6-50 MG per tablet Take 1-2 tablets by mouth 2 (two) times daily as needed for mild constipation or moderate constipation. 60 tablet 3  . sertraline (ZOLOFT) 100 MG tablet Take 1 tablet (100 mg total) by mouth daily. 90 tablet 1  . sucralfate (CARAFATE) 1 GM/10ML suspension Take 1 g by mouth 4 (four) times daily as needed (indigestion).    . torsemide (DEMADEX) 20 MG tablet Take 2 tablets (40 mg total) by mouth 2 (two) times daily. 120 tablet 6  . zolpidem (AMBIEN) 5 MG tablet TAKE 1 AND 1/2 TABLETS BY MOUTH EVERY DAY AT BEDTIME 45 tablet 0  . metolazone (ZAROXOLYN) 2.5 MG tablet Take 1 tablet (2.5 mg total) by mouth daily as needed (for fluid retention). (Patient not taking: Reported on 09/11/2014) 10 tablet 6   No current facility-administered medications for this encounter.    BP 99/73 mmHg  Pulse 92  Resp 22  Wt 224 lb (101.606 kg)  SpO2 93%  PHYSICAL EXAM: General: NAD; Chronically ill appearing.   HEENT: normal  Neck: supple. JVP 8 cm. Carotids 2+ bilat; no bruits. No lymphadenopathy or thyromegaly appreciated.  Cor: PMI laterally displaced. Regular S1 S2 with no S3 and 2/6 HSM LLSB. R upper chest hickman site with milrinone infusing.  Lungs: Slight crackles at bases bilaterally.  Abdomen: soft, nontender, obese. No bruits or masses. Good bowel sounds.  Moderate abdominal distention.  Extremities: no cyanosis, clubbing, rash, no lower  extremity edema  Neuro: alert & orientedx3, cranial nerves grossly intact. moves all 4 extremities w/o difficulty. Affect pleasant   ASSESSMENT & PLAN:   1) Chronic systolic HF: Nonischemic cardiomyopathy, EF 20-25% (06/2013). St Jude ICD (single chamber, no Corevue). He has been on home milrinone 0.375 mcg/kg/min since January 2014. NYHA class III symptoms. Recent prolonged admission at [facility], sound like he had a cardiac cath there that showed no CAD.  He does not look markedly volume overloaded but is more short of breath than in the past.  This may be due in part to deconditioning.   - Continue Coreg, eplerenone - Increase torsemide to 40 mg bid and check BNP today.     - No ACEI with CKD - Continue milrinone at 0.375 mcg/kg/min.   - We will try to get records of recent hospitalization in [facility].  - Abdominal distention: will arrange for abdominal US to look for ascites.  2) Atrial arrhythmias: Paroxysmal. He is in NSR today.   - Continue amiodarone. He will need TSH and LFTs at next appointment, should get yearly eye exams.  - He was taken off Eliquis during recent hospitalization due to hematuria.  He is no longer having hematuria but is having dysuria and difficulty urinating.  He was told to followup with urology when he got home.  I will arrange for a urology appointment as soon as possible.   Hopefully can restart Eliquis soon.  3) CKD, stage III: Stable creatinine on recent BMET.     4) VT/VF: During hospitalization 01/10/14.  He had another episode 05/19/14 with amio increased to 400 mg twice a day x 1 week then amio 200 mg bid x 3 months.  He can go back to amiodarone 200 mg daily at next appt.   5) Deconditioning: PCP arranging for home PT>   Follow up in 2 wks  Loralie Champagne 09/13/2014

## 2014-09-14 DIAGNOSIS — G894 Chronic pain syndrome: Secondary | ICD-10-CM | POA: Diagnosis not present

## 2014-09-14 DIAGNOSIS — M545 Low back pain: Secondary | ICD-10-CM | POA: Diagnosis not present

## 2014-09-14 DIAGNOSIS — Z79899 Other long term (current) drug therapy: Secondary | ICD-10-CM | POA: Diagnosis not present

## 2014-09-15 DIAGNOSIS — M5137 Other intervertebral disc degeneration, lumbosacral region: Secondary | ICD-10-CM | POA: Diagnosis not present

## 2014-09-15 DIAGNOSIS — I5022 Chronic systolic (congestive) heart failure: Secondary | ICD-10-CM | POA: Diagnosis not present

## 2014-09-15 NOTE — Telephone Encounter (Signed)
Left message with pt's spouse for pt to return my call.

## 2014-09-15 NOTE — Telephone Encounter (Signed)
Patient is home and would like for you to call him back

## 2014-09-15 NOTE — Telephone Encounter (Signed)
Spoke with pt's wife. She states he is at a doctor's appt and should be out in about 1 hour and requests that I call him on his cell#.

## 2014-09-15 NOTE — Telephone Encounter (Signed)
Pt is returning your call.   CB#: [phone]

## 2014-09-16 NOTE — Telephone Encounter (Signed)
Notified pt and he voices understanding. Lab appt scheduled for 09/23/14 at 10am. Future lab order entered.

## 2014-09-17 DIAGNOSIS — I5022 Chronic systolic (congestive) heart failure: Secondary | ICD-10-CM | POA: Diagnosis not present

## 2014-09-18 ENCOUNTER — Ambulatory Visit (HOSPITAL_COMMUNITY)
Admission: RE | Admit: 2014-09-18 | Discharge: 2014-09-18 | Disposition: A | Discharge status: Home / Self Care | Payer: Medicare Other | Source: Ambulatory Visit | Attending: Cardiology | Admitting: Cardiology

## 2014-09-18 ENCOUNTER — Other Ambulatory Visit (HOSPITAL_COMMUNITY): Payer: Self-pay | Admitting: Internal Medicine

## 2014-09-18 ENCOUNTER — Other Ambulatory Visit (HOSPITAL_COMMUNITY): Payer: Self-pay | Admitting: Vascular Surgery

## 2014-09-18 DIAGNOSIS — R932 Abnormal findings on diagnostic imaging of liver and biliary tract: Secondary | ICD-10-CM | POA: Diagnosis not present

## 2014-09-18 DIAGNOSIS — I2510 Atherosclerotic heart disease of native coronary artery without angina pectoris: Secondary | ICD-10-CM | POA: Diagnosis not present

## 2014-09-18 DIAGNOSIS — I509 Heart failure, unspecified: Secondary | ICD-10-CM | POA: Insufficient documentation

## 2014-09-18 DIAGNOSIS — R140 Abdominal distension (gaseous): Secondary | ICD-10-CM | POA: Insufficient documentation

## 2014-09-18 DIAGNOSIS — I10 Essential (primary) hypertension: Secondary | ICD-10-CM | POA: Diagnosis not present

## 2014-09-21 DIAGNOSIS — I5022 Chronic systolic (congestive) heart failure: Secondary | ICD-10-CM | POA: Diagnosis not present

## 2014-09-23 ENCOUNTER — Other Ambulatory Visit (INDEPENDENT_AMBULATORY_CARE_PROVIDER_SITE_OTHER): Payer: Medicare Other

## 2014-09-23 DIAGNOSIS — E876 Hypokalemia: Secondary | ICD-10-CM | POA: Diagnosis not present

## 2014-09-23 LAB — BASIC METABOLIC PANEL
BUN: 38 mg/dL — AB (ref 6–23)
CO2: 30 mEq/L (ref 19–32)
CREATININE: 1.72 mg/dL — AB (ref 0.40–1.50)
Calcium: 9.6 mg/dL (ref 8.4–10.5)
Chloride: 103 mEq/L (ref 96–112)
GFR: 51.63 mL/min — AB (ref >60.00)
Glucose, Bld: 120 mg/dL — ABNORMAL HIGH (ref 70–99)
POTASSIUM: 3.3 meq/L — AB (ref 3.5–5.1)
Sodium: 140 mEq/L (ref 135–145)

## 2014-09-24 ENCOUNTER — Encounter (HOSPITAL_COMMUNITY)
Admission: RE | Admit: 2014-09-24 | Discharge: 2014-09-24 | Disposition: A | Discharge status: Home / Self Care | Payer: Medicare Other | Source: Ambulatory Visit | Attending: Internal Medicine | Admitting: Internal Medicine

## 2014-09-24 ENCOUNTER — Encounter (HOSPITAL_COMMUNITY): Payer: Self-pay

## 2014-09-24 ENCOUNTER — Telehealth: Payer: Self-pay | Admitting: Family

## 2014-09-24 ENCOUNTER — Ambulatory Visit (HOSPITAL_COMMUNITY)
Admission: RE | Admit: 2014-09-24 | Discharge: 2014-09-24 | Disposition: A | Discharge status: Home / Self Care | Payer: Medicare Other | Source: Ambulatory Visit | Attending: Internal Medicine | Admitting: Internal Medicine

## 2014-09-24 VITALS — BP 132/84 | HR 91 | Wt 226.0 lb

## 2014-09-24 DIAGNOSIS — K219 Gastro-esophageal reflux disease without esophagitis: Secondary | ICD-10-CM | POA: Insufficient documentation

## 2014-09-24 DIAGNOSIS — N183 Chronic kidney disease, stage 3 (moderate): Secondary | ICD-10-CM | POA: Diagnosis not present

## 2014-09-24 DIAGNOSIS — I428 Other cardiomyopathies: Secondary | ICD-10-CM | POA: Diagnosis not present

## 2014-09-24 DIAGNOSIS — I5022 Chronic systolic (congestive) heart failure: Secondary | ICD-10-CM | POA: Insufficient documentation

## 2014-09-24 DIAGNOSIS — I639 Cerebral infarction, unspecified: Secondary | ICD-10-CM | POA: Diagnosis not present

## 2014-09-24 DIAGNOSIS — I2510 Atherosclerotic heart disease of native coronary artery without angina pectoris: Secondary | ICD-10-CM | POA: Diagnosis not present

## 2014-09-24 DIAGNOSIS — N184 Chronic kidney disease, stage 4 (severe): Secondary | ICD-10-CM

## 2014-09-24 DIAGNOSIS — D869 Sarcoidosis, unspecified: Secondary | ICD-10-CM | POA: Insufficient documentation

## 2014-09-24 DIAGNOSIS — Z79899 Other long term (current) drug therapy: Secondary | ICD-10-CM | POA: Insufficient documentation

## 2014-09-24 DIAGNOSIS — I252 Old myocardial infarction: Secondary | ICD-10-CM | POA: Insufficient documentation

## 2014-09-24 DIAGNOSIS — E780 Pure hypercholesterolemia: Secondary | ICD-10-CM | POA: Diagnosis not present

## 2014-09-24 DIAGNOSIS — I509 Heart failure, unspecified: Secondary | ICD-10-CM | POA: Diagnosis not present

## 2014-09-24 DIAGNOSIS — R0602 Shortness of breath: Secondary | ICD-10-CM | POA: Diagnosis not present

## 2014-09-24 DIAGNOSIS — Z95810 Presence of automatic (implantable) cardiac defibrillator: Secondary | ICD-10-CM | POA: Diagnosis not present

## 2014-09-24 DIAGNOSIS — I69351 Hemiplegia and hemiparesis following cerebral infarction affecting right dominant side: Secondary | ICD-10-CM | POA: Diagnosis not present

## 2014-09-24 DIAGNOSIS — I129 Hypertensive chronic kidney disease with stage 1 through stage 4 chronic kidney disease, or unspecified chronic kidney disease: Secondary | ICD-10-CM | POA: Insufficient documentation

## 2014-09-24 MED ORDER — HEPARIN SOD (PORK) LOCK FLUSH 100 UNIT/ML IV SOLN
250.0000 [IU] | INTRAVENOUS | Status: AC | PRN
  Administered 2014-09-24: 250 [IU]

## 2014-09-24 MED ORDER — APIXABAN 5 MG PO TABS: 5.0000 mg | ORAL_TABLET | Freq: Two times a day (BID) | ORAL | Status: AC

## 2014-09-24 MED ORDER — AMIODARONE HCL 200 MG PO TABS: 200.0000 mg | ORAL_TABLET | Freq: Every day | ORAL | Status: DC

## 2014-09-24 MED ORDER — ATORVASTATIN CALCIUM 40 MG PO TABS: 40.0000 mg | ORAL_TABLET | Freq: Every day | ORAL | Status: DC

## 2014-09-24 NOTE — Progress Notes (Signed)
Patient ID: [name], male   DOB: 1950/09/16, 64 y.o.   MRN: [id number] PCP: Debbrah Alar   HPI: [name] is a 64 y.o. 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 He also has history of ventricular tachycardia s/p St Jude ICD (single lead, no Corevue) by Dr. Lovena Le, CKD stage IV, and paroxysmal atrial arrhythmias on amiodarone as well as sarcoidosis and hypertension. Cath in 2008 by Dr. Terrence Dupont showed no CAD.  Seen at [facility] and not felt to be a transplant candidate due to renal failure and lack of family support.  Milrinone initiated in January 2014 for low output. 03/01/12 milrinone increased 0.375 mcg/kg/min.   04/26/12 S/P Successful DC-CV for atrial fibrillation. Remains on amiodarone.   Admitted to [facility] 01/09/14-01/15/2014 with an infected PICC. Gram negative bacteremia with antibiotic course. Hospitalization was complicated by VT/VF. Discharge weight was 240 pounds.   Since last appointment here, he was admitted to the hospital in [facility] for 3 weeks in 8/16.  He was initially admitted with chest pain, dyspnea, and abdominal pain.  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  His stay was complicated by delirium with hallucinations.  He had hematuria and Eliquis was stopped.  He says they told him that he had a bladder infection.  Not currently having hematuria.  By the end of his hospitalization, he was very debilitated and barely able to walk.  He is now back at home.    He remains on Milrinone 0.375 mcg/kg/min.  Denies SOB/PND/Orthopnea. Weight at hom 221 pounds.  Appetite fair.  Taking all medications. He remains off Eliquis. No hematuria. Followed by [facility]. No BRBPR .   ECHO 03/22/12 EF 15%  Echo 6/15: EF 20-25%  RHC 04/30/13 RA = 4  RV = 20/1/3  PA = 23/8 (15)  PCW = 5  Fick cardiac output/index =4.5/1.9  Thermo CO/CI =  3.9/1.6  PVR =  FA sat = 98%  PA sat = 62%, 64% - On milrinone 0.25  He is not on an ace inhibitor due to elevated creatinine. Not on nitrates due to Viagra use.   Labs:  08/15/12 Creatinine 4.24 Potassium 3.3 09/19/12 Creatinine 3.98, Potassium 3.1, BUN 78 10/07/12 Creatinine 2.62, Potassium 3.4 10/18/12 Creatinine 3.3, K+3.1 11/08/12 Creatinine 2.15 K 4.0 11/25/12 Creatinine 2.93, K+ 3.5 03/10/13: K+ 2.9, Creatinine 2.63 04/30/13: K+ 3.7, creatinine 2.4 6/15: K 3, creatinine 3.6 09/11/13: K 3.1 Cr 1.87  10/15: K 3.8, creatinine 2.13, HCT 40.7 11/06/13 K 4.0 Creatinine 1.58  12/17/13 K 3.6 Creatinine 2.3  3/16 K 3.9, creatinine 2.02 04/2014: K 3.4 => 3.7,  Creatinine 2.37 => 2.02, TSH/free T4/free T3 normal, AST 45, ALT 40 05/19/2014: L 4.0 Creatinine 1.51 Mag 2.2 BNP 53 9/16: K 3.2, creatinine 1.37, hgb 11.7, plts 196 09/23/2014: K 3.3 Creatinine 1.72     ROS: All systems negative except as listed in HPI, PMH and Problem List.  Past Medical History  Diagnosis Date  . CHF (congestive heart failure)   . Sarcoidosis   . Cardiomyopathy, dilated, nonischemic     non ischemic by cath  . Acute on chronic systolic heart failure   . Automatic implantable cardiac defibrillator in situ   . Atrial fibrillation   . NSVT (nonsustained ventricular tachycardia)   . GERD (gastroesophageal reflux disease)   . Hypercholesteremia   . Shortness of breath   .  Chronic kidney disease (CKD), stage III (moderate)   . Pacemaker   . Anginal pain   . Gout   . Hypertension     dr [name]  . Coronary artery disease   . Elevated PSA 06/24/2013  . Myocardial infarction   . Headache   . Neuropathy    Current Outpatient Prescriptions  Medication Sig Dispense Refill  . allopurinol (ZYLOPRIM) 100 MG tablet Take 1 tablet (100 mg total) by mouth daily. 30 tablet 6  . ALPRAZolam (XANAX) 0.5 MG tablet Take 1 tablet (0.5 mg total) by mouth 3 (three) times daily as needed for anxiety. 60 tablet 0  .  amiodarone (PACERONE) 200 MG tablet Take 1 tablet (200 mg total) by mouth 2 (two) times daily. 60 tablet 6  . carvedilol (COREG) 6.25 MG tablet TAKE 1 TABLET (6.25 MG TOTAL) BY MOUTH 2 (TWO) TIMES DAILY WITH A MEAL. 60 tablet 3  . diphenhydrAMINE (BENADRYL) 50 MG capsule Take 50 mg by mouth daily as needed for itching.    Marland Kitchen eplerenone (INSPRA) 25 MG tablet Take 12.5 mg by mouth every other day.     . fluticasone (FLONASE) 50 MCG/ACT nasal spray Place 2 sprays into both nostrils daily as needed for allergies. 16 g 2  . HORIZANT 600 MG TBCR Take 600 mg by mouth at bedtime.    . magnesium oxide (MAG-OX) 400 MG tablet Take 400 mg by mouth 2 (two) times daily.    . milrinone (PRIMACOR) 20 MG/100ML SOLN infusion Inject 0.375 mcg/kg/min into the vein continuous. 440mg  in 456ml infusion 2.4 ml per hour    . Oxycodone HCl 20 MG TABS Take 1 tablet (20 mg total) by mouth 3 (three) times daily as needed (pain.). 15 tablet 0  . potassium chloride SA (K-DUR,KLOR-CON) 20 MEQ tablet Take 2 tablets (40 mEq total) by mouth daily. 60 tablet 6  . ranitidine (ZANTAC) 150 MG capsule Take 150 mg by mouth daily as needed for heartburn (reflux).    Marland Kitchen senna-docusate (SENOKOT-S) 8.6-50 MG per tablet Take 1-2 tablets by mouth 2 (two) times daily as needed for mild constipation or moderate constipation. 60 tablet 3  . sertraline (ZOLOFT) 100 MG tablet Take 1 tablet (100 mg total) by mouth daily. 90 tablet 1  . sucralfate (CARAFATE) 1 GM/10ML suspension Take 1 g by mouth 4 (four) times daily as needed (indigestion).    . torsemide (DEMADEX) 20 MG tablet Take 2 tablets (40 mg total) by mouth 2 (two) times daily. 120 tablet 6  . zolpidem (AMBIEN) 5 MG tablet TAKE 1 AND 1/2 TABLETS BY MOUTH EVERY DAY AT BEDTIME 45 tablet 0  . metolazone (ZAROXOLYN) 2.5 MG tablet Take 1 tablet (2.5 mg total) by mouth daily as needed (for fluid retention). (Patient not taking: Reported on 09/24/2014) 10 tablet 6   No current facility-administered  medications for this encounter.   BP 132/84 mmHg  Pulse 91  Wt 226 lb (102.513 kg)  SpO2 97%  PHYSICAL EXAM: General: NAD; Chronically ill appearing.  Ambulated in clinic without difficulty.  HEENT: normal  Neck: supple. JVP 6-7 cm. Carotids 2+ bilat; no bruits. No lymphadenopathy or thyromegaly appreciated.  Cor: PMI laterally displaced. Regular S1 S2 with no S3 and 2/6 HSM LLSB. R upper chest hickman site with milrinone infusing.  Lungs: Slight crackles at bases bilaterally.  Abdomen: soft, nontender, obese. No bruits or masses. Good bowel sounds.  Moderate abdominal distention.  Extremities: no cyanosis, clubbing, rash, no lower extremity edema  Neuro:  alert & orientedx3, cranial nerves grossly intact. moves all 4 extremities w/o difficulty. Affect pleasant   ASSESSMENT & PLAN:   1) Chronic systolic HF: Nonischemic cardiomyopathy, EF 20-25% (06/2013). St Jude ICD (single chamber, no Corevue). He has been on home milrinone 0.375 mcg/kg/min since January 2014. NYHA class III symptoms. Overall doing ok.  Volume status stable.  Continue torsemide to 40 mg bid -Continue carvedilol 6.25 mg twice a day.  - No ACEI with CKD. Continue inspra.  - Continue milrinone at 0.375 mcg/kg/min.   - 2) Atrial arrhythmias: Paroxysmal. - Continue amiodarone. He will need TSH and LFTs at next appointment, should get yearly eye exams.  - Had problems with hematuria during recent hospital admit Eliquis was stopped. No further hematuria.  Urology appointment scheduled.  Restart eliquis today. Has appointment in October.    3) CKD, stage III: Stable creatinine on recent BMET.     4) VT/VF: During hospitalization 01/10/14.  He had another episode 05/19/14 with amio increased to 400 mg twice a day x 1 week then amio 200 mg bid x 3 months.  Today we will cut back amiodarone to 200 mg daily. Marland Kitchen   5) Deconditioning: PCP arranging for home PT 6) CVA 08/2014 - Starting eliquis as above. Add atorvastatin 40 mg today    Today we are requestin records from [facility].    Follow up in 4-6 weeks NP  CLEGG,AMY NP-C  09/24/2014

## 2014-09-24 NOTE — Telephone Encounter (Signed)
Please confirm that pt is taking Kdur 40 once daily.  If so, I would like him to change to 62meq in AM and 20 meq in PM, repeat bmet in 1 week, dx hypokalemia. Kidney function is stable.

## 2014-09-24 NOTE — Patient Instructions (Signed)
Decrease Amiodarone 200 mg once daily  Start Eliquis 5 mg one tablet twice a day  Start Atorvastatin 40 mg one tablet at night time   Follow up in 4-6 weeks with Amy NP   We will get records from [facility] from your admit

## 2014-09-24 NOTE — Telephone Encounter (Signed)
Spoke with patient who says that he has not been taking Kdur as prescribed.  He says that he typically takes 2 tablets (40 meq) every other day.  He says that he'll start back taking med every day due to recent low in potassium.  He also inquired about the results of his most recent abdominal ultrasound.  He also mentioned that he really would like to have an EGD done.  He says that he continues to have stomach pains and no one seems to know why.   Please advise.

## 2014-09-25 ENCOUNTER — Telehealth (HOSPITAL_COMMUNITY): Payer: Self-pay | Admitting: *Deleted

## 2014-09-25 ENCOUNTER — Other Ambulatory Visit (HOSPITAL_COMMUNITY): Payer: Self-pay | Admitting: Internal Medicine

## 2014-09-25 MED ORDER — MAGNESIUM OXIDE 400 MG PO TABS: 400.0000 mg | ORAL_TABLET | Freq: Three times a day (TID) | ORAL | Status: DC

## 2014-09-25 MED ORDER — POTASSIUM CHLORIDE CRYS ER 20 MEQ PO TBCR: 40.0000 meq | EXTENDED_RELEASE_TABLET | Freq: Two times a day (BID) | ORAL | Status: DC

## 2014-09-25 NOTE — Telephone Encounter (Signed)
Pt notified.  Provider's recommendation discussed with patient.  He stated understanding and agreed.  He also asked for an appointment to discuss a personal issue.  Appointment scheduled for Tuesday, 09/29/14 @ 8:30 am.

## 2014-09-25 NOTE — Telephone Encounter (Signed)
Patty, RN called to report they have never gotten any PT orders for pt and wants a VO if ok with [place], per OV note 9/22 appeared PCP was ordering but she states they have not done so, gave VO for PT eval

## 2014-09-25 NOTE — Telephone Encounter (Signed)
Spoke with pt and advised him to take potassium 63meq twice a day and magnesium 40mg  3 times a day.

## 2014-09-25 NOTE — Telephone Encounter (Signed)
Gallbladder looked normal on his ultrasound. Since he is already established with Dr. Ardis Hughs, I would recommend that he arrange follow up with them and discuss EGD with Dr. Ardis Hughs. Their number is: [phone]

## 2014-09-25 NOTE — Telephone Encounter (Signed)
Attempted to reach pt at home, unable to leave message.

## 2014-09-25 NOTE — Telephone Encounter (Signed)
When you speak to patient, please also let him know that I reviwed the letter from the nurse from his insurance company and she wanted to know if pt still needs to be on nexium.  I see that he is on zantac and our records indicate that nexium was discontinued back in the spring, so I would recommend he remain off of nexium. Also, there was question if he should remain on Horizant 600mg  and I think it is fine for him to continue at bedtime.

## 2014-09-28 ENCOUNTER — Telehealth (HOSPITAL_COMMUNITY): Payer: Self-pay | Admitting: *Deleted

## 2014-09-28 DIAGNOSIS — Z5181 Encounter for therapeutic drug level monitoring: Secondary | ICD-10-CM | POA: Diagnosis not present

## 2014-09-28 DIAGNOSIS — Z950 Presence of cardiac pacemaker: Secondary | ICD-10-CM | POA: Diagnosis not present

## 2014-09-28 DIAGNOSIS — Z452 Encounter for adjustment and management of vascular access device: Secondary | ICD-10-CM | POA: Diagnosis not present

## 2014-09-28 DIAGNOSIS — I129 Hypertensive chronic kidney disease with stage 1 through stage 4 chronic kidney disease, or unspecified chronic kidney disease: Secondary | ICD-10-CM | POA: Diagnosis not present

## 2014-09-28 DIAGNOSIS — Z7901 Long term (current) use of anticoagulants: Secondary | ICD-10-CM | POA: Diagnosis not present

## 2014-09-28 DIAGNOSIS — Z79899 Other long term (current) drug therapy: Secondary | ICD-10-CM | POA: Diagnosis not present

## 2014-09-28 DIAGNOSIS — I480 Paroxysmal atrial fibrillation: Secondary | ICD-10-CM | POA: Diagnosis not present

## 2014-09-28 DIAGNOSIS — Z9981 Dependence on supplemental oxygen: Secondary | ICD-10-CM | POA: Diagnosis not present

## 2014-09-28 DIAGNOSIS — N184 Chronic kidney disease, stage 4 (severe): Secondary | ICD-10-CM | POA: Diagnosis not present

## 2014-09-28 DIAGNOSIS — I5022 Chronic systolic (congestive) heart failure: Secondary | ICD-10-CM | POA: Diagnosis not present

## 2014-09-28 NOTE — Telephone Encounter (Signed)
Discussed w/Dr Bensimhon, he feels pt could have URI or bad cold advised he continue to monitor, can be seen at urgent care of ER if needed, pt is aware and agreeable

## 2014-09-28 NOTE — Telephone Encounter (Signed)
Faxed over a medical release of information to [facility]  Pt was seen Aug of 2016  Fax # [phone] Faxed OK

## 2014-09-28 NOTE — Telephone Encounter (Signed)
Pt called stating he is really feeling bad.  He states he has been having the chills, chest pressure and just generally feels bad.  He states the physical therapist is there now and he dose have a little fever of 99.7.  He states he is having "chest pressure" and he is more SOB than usual, he states his picc site looks fine, no redness or tenderness. Will discuss w/Dr Bensimhon and call pt back

## 2014-09-29 ENCOUNTER — Ambulatory Visit: Payer: Medicare Other | Admitting: Family

## 2014-09-29 DIAGNOSIS — M545 Low back pain: Secondary | ICD-10-CM | POA: Diagnosis not present

## 2014-09-29 DIAGNOSIS — G894 Chronic pain syndrome: Secondary | ICD-10-CM | POA: Diagnosis not present

## 2014-09-29 DIAGNOSIS — M5137 Other intervertebral disc degeneration, lumbosacral region: Secondary | ICD-10-CM | POA: Diagnosis not present

## 2014-09-29 DIAGNOSIS — M47817 Spondylosis without myelopathy or radiculopathy, lumbosacral region: Secondary | ICD-10-CM | POA: Diagnosis not present

## 2014-09-29 DIAGNOSIS — I5022 Chronic systolic (congestive) heart failure: Secondary | ICD-10-CM | POA: Diagnosis not present

## 2014-09-29 DIAGNOSIS — Z79899 Other long term (current) drug therapy: Secondary | ICD-10-CM | POA: Diagnosis not present

## 2014-09-29 DIAGNOSIS — M79606 Pain in leg, unspecified: Secondary | ICD-10-CM | POA: Diagnosis not present

## 2014-09-30 ENCOUNTER — Other Ambulatory Visit (HOSPITAL_COMMUNITY): Payer: Self-pay | Admitting: Internal Medicine

## 2014-09-30 ENCOUNTER — Encounter: Payer: Self-pay | Admitting: Family

## 2014-09-30 ENCOUNTER — Ambulatory Visit (INDEPENDENT_AMBULATORY_CARE_PROVIDER_SITE_OTHER): Payer: Medicare Other | Admitting: Family

## 2014-09-30 VITALS — BP 123/97 | HR 90 | Temp 98.2°F | Resp 16 | Ht 75.0 in | Wt 224.0 lb

## 2014-09-30 DIAGNOSIS — R300 Dysuria: Secondary | ICD-10-CM | POA: Diagnosis not present

## 2014-09-30 DIAGNOSIS — N529 Male erectile dysfunction, unspecified: Secondary | ICD-10-CM | POA: Diagnosis not present

## 2014-09-30 DIAGNOSIS — R195 Other fecal abnormalities: Secondary | ICD-10-CM | POA: Diagnosis not present

## 2014-09-30 DIAGNOSIS — N410 Acute prostatitis: Secondary | ICD-10-CM

## 2014-09-30 LAB — POCT URINALYSIS DIPSTICK
Bilirubin, UA: NEGATIVE
Glucose, UA: NEGATIVE
KETONES UA: NEGATIVE
Leukocytes, UA: NEGATIVE
Nitrite, UA: NEGATIVE
PH UA: 6
RBC UA: NEGATIVE
SPEC GRAV UA: 1.02
UROBILINOGEN UA: 0.2

## 2014-09-30 LAB — COMPREHENSIVE METABOLIC PANEL
ALK PHOS: 77 U/L (ref 39–117)
ALT: 22 U/L (ref 0–53)
AST: 29 U/L (ref 0–37)
Albumin: 3.5 g/dL (ref 3.5–5.2)
BUN: 39 mg/dL — AB (ref 6–23)
CHLORIDE: 104 meq/L (ref 96–112)
CO2: 26 mEq/L (ref 19–32)
CREATININE: 1.54 mg/dL — AB (ref 0.40–1.50)
Calcium: 9.8 mg/dL (ref 8.4–10.5)
GFR: 58.65 mL/min — ABNORMAL LOW (ref >60.00)
GLUCOSE: 121 mg/dL — AB (ref 70–99)
POTASSIUM: 3.6 meq/L (ref 3.5–5.1)
SODIUM: 139 meq/L (ref 135–145)
TOTAL PROTEIN: 7.3 g/dL (ref 6.0–8.3)
Total Bilirubin: 0.6 mg/dL (ref 0.2–1.2)

## 2014-09-30 LAB — CBC WITH DIFFERENTIAL/PLATELET
Basophils Absolute: 0.1 10*3/uL (ref 0.0–0.1)
Basophils Relative: 0.8 % (ref 0.0–3.0)
EOS ABS: 0.1 10*3/uL (ref 0.0–0.7)
EOS PCT: 1 % (ref 0.0–5.0)
HCT: 41.5 % (ref 39.0–52.0)
HEMOGLOBIN: 13.9 g/dL (ref 13.0–17.0)
LYMPHS ABS: 1.5 10*3/uL (ref 0.7–4.0)
Lymphocytes Relative: 22.9 % (ref 12.0–46.0)
MCHC: 33.4 g/dL (ref 30.0–36.0)
MCV: 89.6 fl (ref 78.0–100.0)
MONO ABS: 1 10*3/uL (ref 0.1–1.0)
Monocytes Relative: 15.5 % — ABNORMAL HIGH (ref 3.0–12.0)
NEUTROS PCT: 59.8 % (ref 43.0–77.0)
Neutro Abs: 4 10*3/uL (ref 1.4–7.7)
Platelets: 214 10*3/uL (ref 150.0–400.0)
RBC: 4.64 Mil/uL (ref 4.22–5.81)
RDW: 15.2 % (ref 11.5–15.5)
WBC: 6.7 10*3/uL (ref 4.0–10.5)

## 2014-09-30 MED ORDER — SULFAMETHOXAZOLE-TRIMETHOPRIM 400-80 MG PO TABS: 1.0000 | ORAL_TABLET | Freq: Two times a day (BID) | ORAL | Status: DC

## 2014-09-30 NOTE — Patient Instructions (Signed)
Please complete lab work prior to leaving. Start Bactrim (antibiotic for urine infection and for prostate infection) You will be contacted about your referral to Urology and GI. Go to ER if you develop worsening weakness, inability to empty your bladder or if you develop fever >100 on antibiotics. Follow up with me on Friday.

## 2014-09-30 NOTE — Progress Notes (Signed)
Pre visit review using our clinic review tool, if applicable. No additional management support is needed unless otherwise documented below in the visit note. 

## 2014-09-30 NOTE — Progress Notes (Signed)
Subjective:    Patient ID: [name], male    DOB: 26-Jul-1950, 64 y.o.   MRN: [id number]  HPI  [name] is a 64 yr old male who presents today with chief complaint of fever.  Reports that on Monday he felt weak and was told by PT that he had low grade fever 99.7.  Reports that he developed a urinary odor, dark yellow/brown urine.  He reports shooting pains in his lower abdomen and the pain radiates down into the groin. Reports that when he urinates he has to "push" and has dysuria.  + frequency. Has had some dark stools, denies bloody stools.    Review of Systems   See HPI  Past Medical History  Diagnosis Date  . CHF (congestive heart failure)   . Sarcoidosis   . Cardiomyopathy, dilated, nonischemic     non ischemic by cath  . Acute on chronic systolic heart failure   . Automatic implantable cardiac defibrillator in situ   . Atrial fibrillation   . NSVT (nonsustained ventricular tachycardia)   . GERD (gastroesophageal reflux disease)   . Hypercholesteremia   . Shortness of breath   . Chronic kidney disease (CKD), stage III (moderate)   . Pacemaker   . Anginal pain   . Gout   . Hypertension     dr [name]  . Coronary artery disease   . Elevated PSA 06/24/2013  . Myocardial infarction   . Headache   . Neuropathy     Social History   Social History  . Marital Status: Single    Spouse Name: N/A  . Number of Children: N/A  . Years of Education: N/A   Occupational History  . Not on file.   Social History Main Topics  . Smoking status: Never Smoker   . Smokeless tobacco: Never Used  . Alcohol Use: No  . Drug Use: Yes    Special: "Crack" cocaine, Cocaine     Comment: last use august 2004  . Sexual Activity: Not Currently   Other Topics Concern  . Not on file   Social History Narrative   3 sons- one in [place], 1 in [place], on in [place]   Retired from post office- on disability   Completed Bachelors from [employer]    Past Surgical History  Procedure  Laterality Date  . Pacemaker insertion  2009    with ICD  . Tee without cardioversion  01/17/2011    Procedure: TRANSESOPHAGEAL ECHOCARDIOGRAM (TEE);  Surgeon: Birdie Riddle, MD;  Location: [place];  Service: Cardiovascular;  Laterality: N/A;  . Cardioversion  01/17/2011    Procedure: CARDIOVERSION;  Surgeon: Birdie Riddle, MD;  Location: [place];  Service: Cardiovascular;  Laterality: N/A;  . Cardiac catheterization    . Insert / replace / remove pacemaker    . Anterior cervical decomp/discectomy fusion  08/21/2011    Procedure: ANTERIOR CERVICAL DECOMPRESSION/DISCECTOMY FUSION 2 LEVELS;  Surgeon: Marybelle Killings, MD;  Location: [place];  Service: Orthopedics;  Laterality: N/A;  C5-6, C6-7 Anterior Cervical Discectomy and Fusion, allograft, plate  . Tee without cardioversion N/A 02/16/2012    Procedure: TRANSESOPHAGEAL ECHOCARDIOGRAM (TEE);  Surgeon: Jolaine Artist, MD;  Location: [facility] ENDOSCOPY;  Service: Cardiovascular;  Laterality: N/A;  original case scheduled under his dad (who is deceased), rescheduled under correct mrn/pt/dob. [place]/dl  . Tee without cardioversion N/A 03/22/2012    Procedure: TRANSESOPHAGEAL ECHOCARDIOGRAM (TEE);  Surgeon: Lelon Perla, MD;  Location: [facility] ENDOSCOPY;  Service: Cardiovascular;  Laterality: N/A;  . Cardioversion N/A 03/22/2012    Procedure: CARDIOVERSION;  Surgeon: Lelon Perla, MD;  Location: [facility] ENDOSCOPY;  Service: Cardiovascular;  Laterality: N/A;  . Cardioversion N/A 04/26/2012    Procedure: CARDIOVERSION;  Surgeon: Jolaine Artist, MD;  Location: [facility] ENDOSCOPY;  Service: Cardiovascular;  Laterality: N/A;  . Central venous catheter tunneled insertion single lumen  09/16/2012    right IJ  . Cardioversion N/A 11/08/2012    Procedure: CARDIOVERSION;  Surgeon: Jolaine Artist, MD;  Location: [facility] ENDOSCOPY;  Service: Cardiovascular;  Laterality: N/A;  . Back surgery  1987, lower    Ruptured disk repair  . Peripherally inserted central catheter  insertion  2013  . Right heart catheterization N/A 01/26/2012    Procedure: RIGHT HEART CATH;  Surgeon: Jolaine Artist, MD;  Location: [facility] CATH LAB;  Service: Cardiovascular;  Laterality: N/A;  . Right heart catheterization N/A 04/30/2013    Procedure: RIGHT HEART CATH;  Surgeon: Jolaine Artist, MD;  Location: [facility] CATH LAB;  Service: Cardiovascular;  Laterality: N/A;    Family History  Problem Relation Age of Onset  . Heart disease    . Heart failure    . Stroke    . Anesthesia problems Neg Hx   . Hypotension Neg Hx   . Malignant hyperthermia Neg Hx   . Pseudochol deficiency Neg Hx   . Heart failure Sister   . Heart attack Sister   . Hyperlipidemia Mother     Allergies  Allergen Reactions  . Nitroglycerin Other (See Comments)    "feels like head is going to bust open"  . Colchicine Nausea Only    Current Outpatient Prescriptions on File Prior to Visit  Medication Sig Dispense Refill  . allopurinol (ZYLOPRIM) 100 MG tablet Take 1 tablet (100 mg total) by mouth daily. 30 tablet 6  . ALPRAZolam (XANAX) 0.5 MG tablet Take 1 tablet (0.5 mg total) by mouth 3 (three) times daily as needed for anxiety. 60 tablet 0  . amiodarone (PACERONE) 200 MG tablet Take 1 tablet (200 mg total) by mouth daily. 30 tablet 3  . apixaban (ELIQUIS) 5 MG TABS tablet Take 1 tablet (5 mg total) by mouth 2 (two) times daily. 60 tablet 3  . atorvastatin (LIPITOR) 40 MG tablet Take 1 tablet (40 mg total) by mouth daily. 30 tablet 3  . carvedilol (COREG) 6.25 MG tablet TAKE 1 TABLET (6.25 MG TOTAL) BY MOUTH 2 (TWO) TIMES DAILY WITH A MEAL. 60 tablet 3  . diphenhydrAMINE (BENADRYL) 50 MG capsule Take 50 mg by mouth daily as needed for itching.    Marland Kitchen eplerenone (INSPRA) 25 MG tablet Take 12.5 mg by mouth every other day.     . fluticasone (FLONASE) 50 MCG/ACT nasal spray Place 2 sprays into both nostrils daily as needed for allergies. 16 g 2  . HORIZANT 600 MG TBCR Take 600 mg by mouth at bedtime.    .  magnesium oxide (MAG-OX) 400 MG tablet Take 1 tablet (400 mg total) by mouth 3 (three) times daily. 90 tablet 3  . metolazone (ZAROXOLYN) 2.5 MG tablet Take 1 tablet (2.5 mg total) by mouth daily as needed (for fluid retention). 10 tablet 6  . milrinone (PRIMACOR) 20 MG/100ML SOLN infusion Inject 0.375 mcg/kg/min into the vein continuous. 440mg  in 419ml infusion 2.4 ml per hour    . potassium chloride SA (K-DUR,KLOR-CON) 20 MEQ tablet Take 2 tablets (40 mEq total) by mouth 2 (two) times daily.  120 tablet 6  . ranitidine (ZANTAC) 150 MG capsule Take 150 mg by mouth daily as needed for heartburn (reflux).    Marland Kitchen senna-docusate (SENOKOT-S) 8.6-50 MG per tablet Take 1-2 tablets by mouth 2 (two) times daily as needed for mild constipation or moderate constipation. 60 tablet 3  . sertraline (ZOLOFT) 100 MG tablet Take 1 tablet (100 mg total) by mouth daily. 90 tablet 1  . sucralfate (CARAFATE) 1 GM/10ML suspension Take 1 g by mouth 4 (four) times daily as needed (indigestion).    . torsemide (DEMADEX) 20 MG tablet Take 2 tablets (40 mg total) by mouth 2 (two) times daily. 120 tablet 6  . zolpidem (AMBIEN) 5 MG tablet TAKE 1 AND 1/2 TABLETS BY MOUTH EVERY DAY AT BEDTIME 45 tablet 0   No current facility-administered medications on file prior to visit.    BP 123/97 mmHg  Pulse 90  Temp(Src) 98.2 F (36.8 C) (Oral)  Resp 16  Ht 6\' 3"  (1.905 m)  Wt 224 lb (101.606 kg)  BMI 28.00 kg/m2  SpO2 100%       Objective:   Physical Exam  Constitutional: He is oriented to person, place, and time. He appears well-developed and well-nourished.  Tired/weak appearing.  HENT:  Head: Normocephalic and atraumatic.  Cardiovascular: Normal rate and regular rhythm.   Pulmonary/Chest: Effort normal and breath sounds normal. No respiratory distress. He has no wheezes. He has no rales. He exhibits no tenderness.  Abdominal: Soft.  Mild lower abdominal tenderness, no guarding  Genitourinary:  Prostate is tender  and enlarged.  Stool is trace heme positive  Musculoskeletal: He exhibits no edema.  Lymphadenopathy:    He has no cervical adenopathy.  Neurological: He is alert and oriented to person, place, and time.  Skin: Skin is warm and dry.  Psychiatric: He has a normal mood and affect. His behavior is normal. Judgment and thought content normal.          Assessment & Plan:  ED- reports that he has tried viagra in the past which has not worked. Refer to urology for further evaluation.  Heme + stool-  Will review with GI.  Hemoglobin is normal.  Prostatitis- UA unremarkable. Will rx with renally dosed bactrim (avoid quinolones due to amiodarone) refer to urology.  He may benefit from flomax, but I am concerned that this may drop his BP, will defer to urology. I have advised pt:  Go to ER if you develop worsening weakness, inability to empty your bladder or if you develop fever >100 on antibiotics. Follow up with me on Friday.

## 2014-10-01 DIAGNOSIS — I509 Heart failure, unspecified: Secondary | ICD-10-CM | POA: Diagnosis not present

## 2014-10-01 DIAGNOSIS — R0602 Shortness of breath: Secondary | ICD-10-CM | POA: Diagnosis not present

## 2014-10-01 DIAGNOSIS — N419 Inflammatory disease of prostate, unspecified: Secondary | ICD-10-CM | POA: Insufficient documentation

## 2014-10-01 DIAGNOSIS — I5022 Chronic systolic (congestive) heart failure: Secondary | ICD-10-CM | POA: Diagnosis not present

## 2014-10-01 LAB — URINE CULTURE
Colony Count: NO GROWTH
ORGANISM ID, BACTERIA: NO GROWTH

## 2014-10-02 ENCOUNTER — Encounter: Payer: Self-pay | Admitting: Family

## 2014-10-02 ENCOUNTER — Ambulatory Visit (INDEPENDENT_AMBULATORY_CARE_PROVIDER_SITE_OTHER): Payer: Medicare Other | Admitting: Family

## 2014-10-02 VITALS — BP 128/96 | HR 100 | Temp 97.9°F | Resp 20 | Ht 75.0 in | Wt 222.6 lb

## 2014-10-02 DIAGNOSIS — N410 Acute prostatitis: Secondary | ICD-10-CM

## 2014-10-02 NOTE — Progress Notes (Signed)
Pre visit review using our clinic review tool, if applicable. No additional management support is needed unless otherwise documented below in the visit note. 

## 2014-10-02 NOTE — Patient Instructions (Signed)
Continue bactrim (antibiotic) for prostatitis. Keep upcoming appointment with urology. Go to ER if you are unable to urinate. Follow up with our office in 3 months, sooner if problems/concerns.

## 2014-10-02 NOTE — Progress Notes (Signed)
Subjective:    Patient ID: [name], male    DOB: 03/09/1950, 64 y.o.   MRN: [id number]  HPI  [name] is a 64 yr old male who presents today for a 2 day follow up of his prostatitis. Denies fever.  He reports that he has some intermittent difficulty urinating.  He reports that on Wednesday he had episode of urinary incontinence.  This is the only time that this has ever occurred.  The patient reports feeling much better than he did when he came in two days ago. Weakness has resolved.  Energy is improved.   Review of Systems See HPI  Past Medical History  Diagnosis Date  . CHF (congestive heart failure)   . Sarcoidosis   . Cardiomyopathy, dilated, nonischemic     non ischemic by cath  . Acute on chronic systolic heart failure   . Automatic implantable cardiac defibrillator in situ   . Atrial fibrillation   . NSVT (nonsustained ventricular tachycardia)   . GERD (gastroesophageal reflux disease)   . Hypercholesteremia   . Shortness of breath   . Chronic kidney disease (CKD), stage III (moderate)   . Pacemaker   . Anginal pain   . Gout   . Hypertension     [name]  . Coronary artery disease   . Elevated PSA 06/24/2013  . Myocardial infarction   . Headache   . Neuropathy     Social History   Social History  . Marital Status: Single    Spouse Name: N/A  . Number of Children: N/A  . Years of Education: N/A   Occupational History  . Not on file.   Social History Main Topics  . Smoking status: Never Smoker   . Smokeless tobacco: Never Used  . Alcohol Use: No  . Drug Use: Yes    Special: "Crack" cocaine, Cocaine     Comment: last use august 2004  . Sexual Activity: Not Currently   Other Topics Concern  . Not on file   Social History Narrative   3 sons- one in [place], 1 in [place], on in [place]   Retired from post office- on disability   Completed Bachelors from [employer]    Past Surgical History  Procedure Laterality Date  . Pacemaker insertion  2009   with ICD  . Tee without cardioversion  01/17/2011    Procedure: TRANSESOPHAGEAL ECHOCARDIOGRAM (TEE);  Surgeon: Birdie Riddle, MD;  Location: [place];  Service: Cardiovascular;  Laterality: N/A;  . Cardioversion  01/17/2011    Procedure: CARDIOVERSION;  Surgeon: Birdie Riddle, MD;  Location: [place];  Service: Cardiovascular;  Laterality: N/A;  . Cardiac catheterization    . Insert / replace / remove pacemaker    . Anterior cervical decomp/discectomy fusion  08/21/2011    Procedure: ANTERIOR CERVICAL DECOMPRESSION/DISCECTOMY FUSION 2 LEVELS;  Surgeon: Marybelle Killings, MD;  Location: [place];  Service: Orthopedics;  Laterality: N/A;  C5-6, C6-7 Anterior Cervical Discectomy and Fusion, allograft, plate  . Tee without cardioversion N/A 02/16/2012    Procedure: TRANSESOPHAGEAL ECHOCARDIOGRAM (TEE);  Surgeon: Jolaine Artist, MD;  Location: [place] ENDOSCOPY;  Service: Cardiovascular;  Laterality: N/A;  original case scheduled under his dad (who is deceased), rescheduled under correct mrn/pt/dob. [place]/dl  . Tee without cardioversion N/A 03/22/2012    Procedure: TRANSESOPHAGEAL ECHOCARDIOGRAM (TEE);  Surgeon: Lelon Perla, MD;  Location: [place];  Service: Cardiovascular;  Laterality: N/A;  . Cardioversion N/A 03/22/2012  Procedure: CARDIOVERSION;  Surgeon: Lelon Perla, MD;  Location: [facility] ENDOSCOPY;  Service: Cardiovascular;  Laterality: N/A;  . Cardioversion N/A 04/26/2012    Procedure: CARDIOVERSION;  Surgeon: Jolaine Artist, MD;  Location: [facility] ENDOSCOPY;  Service: Cardiovascular;  Laterality: N/A;  . Central venous catheter tunneled insertion single lumen  09/16/2012    right IJ  . Cardioversion N/A 11/08/2012    Procedure: CARDIOVERSION;  Surgeon: Jolaine Artist, MD;  Location: [facility] ENDOSCOPY;  Service: Cardiovascular;  Laterality: N/A;  . Back surgery  1987, lower    Ruptured disk repair  . Peripherally inserted central catheter insertion  2013  . Right heart catheterization N/A  01/26/2012    Procedure: RIGHT HEART CATH;  Surgeon: Jolaine Artist, MD;  Location: [facility] CATH LAB;  Service: Cardiovascular;  Laterality: N/A;  . Right heart catheterization N/A 04/30/2013    Procedure: RIGHT HEART CATH;  Surgeon: Jolaine Artist, MD;  Location: [facility] CATH LAB;  Service: Cardiovascular;  Laterality: N/A;    Family History  Problem Relation Age of Onset  . Heart disease    . Heart failure    . Stroke    . Anesthesia problems Neg Hx   . Hypotension Neg Hx   . Malignant hyperthermia Neg Hx   . Pseudochol deficiency Neg Hx   . Heart failure Sister   . Heart attack Sister   . Hyperlipidemia Mother     Allergies  Allergen Reactions  . Nitroglycerin Other (See Comments)    "feels like head is going to bust open"  . Colchicine Nausea Only    Current Outpatient Prescriptions on File Prior to Visit  Medication Sig Dispense Refill  . allopurinol (ZYLOPRIM) 100 MG tablet Take 1 tablet (100 mg total) by mouth daily. 30 tablet 6  . ALPRAZolam (XANAX) 0.5 MG tablet Take 1 tablet (0.5 mg total) by mouth 3 (three) times daily as needed for anxiety. 60 tablet 0  . amiodarone (PACERONE) 200 MG tablet Take 1 tablet (200 mg total) by mouth daily. 30 tablet 3  . apixaban (ELIQUIS) 5 MG TABS tablet Take 1 tablet (5 mg total) by mouth 2 (two) times daily. 60 tablet 3  . atorvastatin (LIPITOR) 40 MG tablet Take 1 tablet (40 mg total) by mouth daily. 30 tablet 3  . carvedilol (COREG) 6.25 MG tablet TAKE 1 TABLET (6.25 MG TOTAL) BY MOUTH 2 (TWO) TIMES DAILY WITH A MEAL. 60 tablet 3  . diphenhydrAMINE (BENADRYL) 50 MG capsule Take 50 mg by mouth daily as needed for itching.    Marland Kitchen eplerenone (INSPRA) 25 MG tablet Take 12.5 mg by mouth every other day.     . fluticasone (FLONASE) 50 MCG/ACT nasal spray Place 2 sprays into both nostrils daily as needed for allergies. 16 g 2  . HORIZANT 600 MG TBCR Take 600 mg by mouth at bedtime.    . magnesium oxide (MAG-OX) 400 MG tablet Take 1 tablet (400  mg total) by mouth 3 (three) times daily. 90 tablet 3  . metolazone (ZAROXOLYN) 2.5 MG tablet Take 1 tablet (2.5 mg total) by mouth daily as needed (for fluid retention). 10 tablet 6  . milrinone (PRIMACOR) 20 MG/100ML SOLN infusion Inject 0.375 mcg/kg/min into the vein continuous. 440mg  in 463ml infusion 2.4 ml per hour    . oxyCODONE-acetaminophen (PERCOCET) 10-325 MG tablet Take 1 tablet by mouth. 4-6 times a day    . potassium chloride SA (K-DUR,KLOR-CON) 20 MEQ tablet Take 2 tablets (40 mEq total) by  mouth 2 (two) times daily. 120 tablet 6  . ranitidine (ZANTAC) 150 MG capsule Take 150 mg by mouth daily as needed for heartburn (reflux).    Marland Kitchen senna-docusate (SENOKOT-S) 8.6-50 MG per tablet Take 1-2 tablets by mouth 2 (two) times daily as needed for mild constipation or moderate constipation. 60 tablet 3  . sertraline (ZOLOFT) 100 MG tablet Take 1 tablet (100 mg total) by mouth daily. 90 tablet 1  . sucralfate (CARAFATE) 1 GM/10ML suspension Take 1 g by mouth 4 (four) times daily as needed (indigestion).    Marland Kitchen sulfamethoxazole-trimethoprim (BACTRIM) 400-80 MG tablet Take 1 tablet by mouth 2 (two) times daily. 42 tablet 0  . torsemide (DEMADEX) 20 MG tablet Take 2 tablets (40 mg total) by mouth 2 (two) times daily. 120 tablet 6  . zolpidem (AMBIEN) 5 MG tablet TAKE 1 AND 1/2 TABLETS BY MOUTH EVERY DAY AT BEDTIME 45 tablet 0   No current facility-administered medications on file prior to visit.    BP 128/96 mmHg  Pulse 100  Temp(Src) 97.9 F (36.6 C) (Oral)  Resp 20  Ht 6\' 3"  (1.905 m)  Wt 222 lb 9.6 oz (100.971 kg)  BMI 27.82 kg/m2  SpO2 96%       Objective:   Physical Exam  Constitutional: He is oriented to person, place, and time. He appears well-developed and well-nourished. No distress.  HENT:  Head: Normocephalic and atraumatic.  Cardiovascular: Normal rate and regular rhythm.   No murmur heard. Pulmonary/Chest: Effort normal and breath sounds normal. No respiratory  distress. He has no wheezes. He has no rales.  Abdominal: Soft. He exhibits no distension.  Mild lower abdominal tenderness without guarding.  No bladder distension noted.   Musculoskeletal: He exhibits no edema.  Neurological: He is alert and oriented to person, place, and time.  Skin: Skin is warm and dry.  Psychiatric: He has a normal mood and affect. His behavior is normal. Thought content normal.          Assessment & Plan:  Prostatitis- He has apt scheduled with Urology in 2 weeks. Advised pt to keep his upcoming appointment, continue abx. Will defer continuation of abx longer than 2 weeks to urology.

## 2014-10-05 ENCOUNTER — Telehealth: Payer: Self-pay | Admitting: Family

## 2014-10-05 NOTE — Telephone Encounter (Signed)
-----   Message from Darrell Banister, MD sent at 10/05/2014 10:31 AM EDT ----- [name], sorry for the delay.  I would overlook the fobt testing for now since his Hb is normal and he is a poor candidate for invasive testing with his severe CHF.  If he has overt bleeding and drop in Hb please let me know  Thanks   ----- Message -----    From: Darrell Alar, NP    Sent: 10/01/2014  10:13 AM      To: Darrell Banister, MD  Dr. Ardis [name],  I saw [name] yesterday.  Guiac was trace positive. His hemoglobin is normal. With his multiple co-morbidities, I am not sure you would want to do an aggressive work up but I wanted to bring this to your attention.    Thank you,  [name]

## 2014-10-07 DIAGNOSIS — N2581 Secondary hyperparathyroidism of renal origin: Secondary | ICD-10-CM | POA: Diagnosis not present

## 2014-10-07 DIAGNOSIS — R339 Retention of urine, unspecified: Secondary | ICD-10-CM | POA: Diagnosis not present

## 2014-10-07 DIAGNOSIS — Z950 Presence of cardiac pacemaker: Secondary | ICD-10-CM | POA: Diagnosis not present

## 2014-10-07 DIAGNOSIS — Z452 Encounter for adjustment and management of vascular access device: Secondary | ICD-10-CM | POA: Diagnosis not present

## 2014-10-07 DIAGNOSIS — I129 Hypertensive chronic kidney disease with stage 1 through stage 4 chronic kidney disease, or unspecified chronic kidney disease: Secondary | ICD-10-CM | POA: Diagnosis not present

## 2014-10-07 DIAGNOSIS — Z79899 Other long term (current) drug therapy: Secondary | ICD-10-CM | POA: Diagnosis not present

## 2014-10-07 DIAGNOSIS — I5022 Chronic systolic (congestive) heart failure: Secondary | ICD-10-CM | POA: Diagnosis not present

## 2014-10-07 DIAGNOSIS — Z9981 Dependence on supplemental oxygen: Secondary | ICD-10-CM | POA: Diagnosis not present

## 2014-10-07 DIAGNOSIS — N184 Chronic kidney disease, stage 4 (severe): Secondary | ICD-10-CM | POA: Diagnosis not present

## 2014-10-07 DIAGNOSIS — Z7901 Long term (current) use of anticoagulants: Secondary | ICD-10-CM | POA: Diagnosis not present

## 2014-10-07 DIAGNOSIS — Z95810 Presence of automatic (implantable) cardiac defibrillator: Secondary | ICD-10-CM | POA: Diagnosis not present

## 2014-10-07 DIAGNOSIS — I480 Paroxysmal atrial fibrillation: Secondary | ICD-10-CM | POA: Diagnosis not present

## 2014-10-07 DIAGNOSIS — Z5181 Encounter for therapeutic drug level monitoring: Secondary | ICD-10-CM | POA: Diagnosis not present

## 2014-10-08 ENCOUNTER — Other Ambulatory Visit (HOSPITAL_COMMUNITY): Payer: Self-pay | Admitting: Internal Medicine

## 2014-10-08 DIAGNOSIS — I5022 Chronic systolic (congestive) heart failure: Secondary | ICD-10-CM | POA: Diagnosis not present

## 2014-10-08 DIAGNOSIS — I509 Heart failure, unspecified: Secondary | ICD-10-CM | POA: Diagnosis not present

## 2014-10-08 DIAGNOSIS — R0602 Shortness of breath: Secondary | ICD-10-CM | POA: Diagnosis not present

## 2014-10-12 DIAGNOSIS — N419 Inflammatory disease of prostate, unspecified: Secondary | ICD-10-CM | POA: Diagnosis not present

## 2014-10-12 DIAGNOSIS — Z79899 Other long term (current) drug therapy: Secondary | ICD-10-CM | POA: Diagnosis not present

## 2014-10-12 DIAGNOSIS — R350 Frequency of micturition: Secondary | ICD-10-CM | POA: Diagnosis not present

## 2014-10-12 DIAGNOSIS — M545 Low back pain: Secondary | ICD-10-CM | POA: Diagnosis not present

## 2014-10-12 DIAGNOSIS — N184 Chronic kidney disease, stage 4 (severe): Secondary | ICD-10-CM | POA: Diagnosis not present

## 2014-10-12 DIAGNOSIS — G894 Chronic pain syndrome: Secondary | ICD-10-CM | POA: Diagnosis not present

## 2014-10-12 DIAGNOSIS — M792 Neuralgia and neuritis, unspecified: Secondary | ICD-10-CM | POA: Diagnosis not present

## 2014-10-13 DIAGNOSIS — I480 Paroxysmal atrial fibrillation: Secondary | ICD-10-CM | POA: Diagnosis not present

## 2014-10-13 DIAGNOSIS — I129 Hypertensive chronic kidney disease with stage 1 through stage 4 chronic kidney disease, or unspecified chronic kidney disease: Secondary | ICD-10-CM | POA: Diagnosis not present

## 2014-10-13 DIAGNOSIS — Z5181 Encounter for therapeutic drug level monitoring: Secondary | ICD-10-CM | POA: Diagnosis not present

## 2014-10-13 DIAGNOSIS — N184 Chronic kidney disease, stage 4 (severe): Secondary | ICD-10-CM | POA: Diagnosis not present

## 2014-10-13 DIAGNOSIS — Z452 Encounter for adjustment and management of vascular access device: Secondary | ICD-10-CM | POA: Diagnosis not present

## 2014-10-13 DIAGNOSIS — Z9981 Dependence on supplemental oxygen: Secondary | ICD-10-CM | POA: Diagnosis not present

## 2014-10-13 DIAGNOSIS — I5022 Chronic systolic (congestive) heart failure: Secondary | ICD-10-CM | POA: Diagnosis not present

## 2014-10-13 DIAGNOSIS — Z7901 Long term (current) use of anticoagulants: Secondary | ICD-10-CM | POA: Diagnosis not present

## 2014-10-13 DIAGNOSIS — Z950 Presence of cardiac pacemaker: Secondary | ICD-10-CM | POA: Diagnosis not present

## 2014-10-13 DIAGNOSIS — Z79899 Other long term (current) drug therapy: Secondary | ICD-10-CM | POA: Diagnosis not present

## 2014-10-15 ENCOUNTER — Encounter: Payer: Self-pay | Admitting: Neurology

## 2014-10-15 ENCOUNTER — Other Ambulatory Visit (INDEPENDENT_AMBULATORY_CARE_PROVIDER_SITE_OTHER): Payer: Medicare Other

## 2014-10-15 ENCOUNTER — Ambulatory Visit (INDEPENDENT_AMBULATORY_CARE_PROVIDER_SITE_OTHER): Payer: Medicare Other | Admitting: Neurology

## 2014-10-15 VITALS — BP 104/70 | HR 92 | Ht 75.0 in | Wt 225.1 lb

## 2014-10-15 DIAGNOSIS — I509 Heart failure, unspecified: Secondary | ICD-10-CM | POA: Diagnosis not present

## 2014-10-15 DIAGNOSIS — I5023 Acute on chronic systolic (congestive) heart failure: Secondary | ICD-10-CM

## 2014-10-15 DIAGNOSIS — G459 Transient cerebral ischemic attack, unspecified: Secondary | ICD-10-CM

## 2014-10-15 DIAGNOSIS — I480 Paroxysmal atrial fibrillation: Secondary | ICD-10-CM

## 2014-10-15 DIAGNOSIS — E7800 Pure hypercholesterolemia, unspecified: Secondary | ICD-10-CM | POA: Diagnosis not present

## 2014-10-15 DIAGNOSIS — I10 Essential (primary) hypertension: Secondary | ICD-10-CM

## 2014-10-15 DIAGNOSIS — I429 Cardiomyopathy, unspecified: Secondary | ICD-10-CM

## 2014-10-15 DIAGNOSIS — R0602 Shortness of breath: Secondary | ICD-10-CM | POA: Diagnosis not present

## 2014-10-15 DIAGNOSIS — E1149 Type 2 diabetes mellitus with other diabetic neurological complication: Secondary | ICD-10-CM

## 2014-10-15 DIAGNOSIS — I420 Dilated cardiomyopathy: Secondary | ICD-10-CM

## 2014-10-15 DIAGNOSIS — I5022 Chronic systolic (congestive) heart failure: Secondary | ICD-10-CM | POA: Diagnosis not present

## 2014-10-15 LAB — LIPID PANEL
CHOL/HDL RATIO: 3
CHOLESTEROL: 264 mg/dL — AB (ref 0–200)
HDL: 92.5 mg/dL (ref >39.00)
LDL CALC: 138 mg/dL — AB (ref 0–99)
NonHDL: 171.84
TRIGLYCERIDES: 170 mg/dL — AB (ref 0.0–149.0)
VLDL: 34 mg/dL (ref 0.0–40.0)

## 2014-10-15 NOTE — Progress Notes (Signed)
NEUROLOGY CONSULTATION NOTE  [name] MRN: [id number] DOB: 03-Apr-1950  Referring provider: Debbrah Alar Primary care provider: Debbrah Alar  Reason for consult:  stroke  HISTORY OF PRESENT ILLNESS: [name] is a 64 year old right-handed male with nonischemic cardiomyopathy, paroxysmal atrial fibrillation with AICD, CKD stage 3, hypertension who presents for stroke.  History obtained by patient, hospital notes and PCP notes.  Images of head CT and labs reviewed.  He was admitted to [facility] in [place] from 08/10/14 to 08/25/14 for delirium, sepsis, UTI, shortness of breath and atrial fibrillation with rapid ventricular response.  He was put on diltiazem drip and treated with IV Lasix.  Sepsis was treated with Zosyn and vancomycin.  2D echo showed EF of 15-20%.  Hgb A1c was 6.1.    Patient reports that he had a small stroke on the table after undergoing a cardiac catheterization.  He reportedly was unable to talk and exhibited right sided weakness for about 15 minutes.  He says he had a CT of the head which showed a small stroke which was age-indeterminant. The hospital notes available do not mention stroke or any of these events.  She was admitted to [facility] on 07/27/14 for dizziness, chest pain and palpitations.  CT of the head performed on 07/27/14, which showed old left occipital infarct but nothing acute.  He was ultimately diagnosed with a viral upper respiratory tract infection.  LDL from 06/23/13 was 70.  Hgb A1c from 10/08/13 was 6.5.  Recent labs include CBC with mild normocytic anemia of 12.5/37.2 and BMP with BUN 33 and Cr 1.46.  He takes Eliquis and Lipitor 40mg  daily.  PAST MEDICAL HISTORY: Past Medical History  Diagnosis Date  . CHF (congestive heart failure) ([place])   . Sarcoidosis ([place])   . Cardiomyopathy, dilated, nonischemic ([place])     non ischemic by cath  . Acute on chronic systolic heart failure ([place])   .  Automatic implantable cardiac defibrillator in situ   . Atrial fibrillation ([place])   . NSVT (nonsustained ventricular tachycardia) ([place])   . GERD (gastroesophageal reflux disease)   . Hypercholesteremia   . Shortness of breath   . Chronic kidney disease (CKD), stage III (moderate)   . Pacemaker   . Anginal pain ([place])   . Gout   . Hypertension     dr [name]  . Coronary artery disease   . Elevated PSA 06/24/2013  . Myocardial infarction ([place])   . Headache   . Neuropathy ([place])     PAST SURGICAL HISTORY: Past Surgical History  Procedure Laterality Date  . Pacemaker insertion  2009    with ICD  . Tee without cardioversion  01/17/2011    Procedure: TRANSESOPHAGEAL ECHOCARDIOGRAM (TEE);  Surgeon: Birdie Riddle, MD;  Location: [place];  Service: Cardiovascular;  Laterality: N/A;  . Cardioversion  01/17/2011    Procedure: CARDIOVERSION;  Surgeon: Birdie Riddle, MD;  Location: [place];  Service: Cardiovascular;  Laterality: N/A;  . Cardiac catheterization    . Insert / replace / remove pacemaker    . Anterior cervical decomp/discectomy fusion  08/21/2011    Procedure: ANTERIOR CERVICAL DECOMPRESSION/DISCECTOMY FUSION 2 LEVELS;  Surgeon: Marybelle Killings, MD;  Location: [place];  Service: Orthopedics;  Laterality: N/A;  C5-6, C6-7 Anterior Cervical Discectomy and Fusion, allograft, plate  . Tee without cardioversion N/A 02/16/2012    Procedure: TRANSESOPHAGEAL ECHOCARDIOGRAM (TEE);  Surgeon: Jolaine Artist, MD;  Location: [facility] ENDOSCOPY;  Service: Cardiovascular;  Laterality: N/A;  original case scheduled under his dad (who is deceased), rescheduled under correct mrn/pt/dob. [place]/dl  . Tee without cardioversion N/A 03/22/2012    Procedure: TRANSESOPHAGEAL ECHOCARDIOGRAM (TEE);  Surgeon: Lelon Perla, MD;  Location: [place];  Service: Cardiovascular;  Laterality: N/A;  . Cardioversion N/A 03/22/2012    Procedure: CARDIOVERSION;  Surgeon: Lelon Perla, MD;  Location: [facility]  ENDOSCOPY;  Service: Cardiovascular;  Laterality: N/A;  . Cardioversion N/A 04/26/2012    Procedure: CARDIOVERSION;  Surgeon: Jolaine Artist, MD;  Location: [facility] ENDOSCOPY;  Service: Cardiovascular;  Laterality: N/A;  . Central venous catheter tunneled insertion single lumen  09/16/2012    right IJ  . Cardioversion N/A 11/08/2012    Procedure: CARDIOVERSION;  Surgeon: Jolaine Artist, MD;  Location: [facility] ENDOSCOPY;  Service: Cardiovascular;  Laterality: N/A;  . Back surgery  1987, lower    Ruptured disk repair  . Peripherally inserted central catheter insertion  2013  . Right heart catheterization N/A 01/26/2012    Procedure: RIGHT HEART CATH;  Surgeon: Jolaine Artist, MD;  Location: [facility] CATH LAB;  Service: Cardiovascular;  Laterality: N/A;  . Right heart catheterization N/A 04/30/2013    Procedure: RIGHT HEART CATH;  Surgeon: Jolaine Artist, MD;  Location: [facility] CATH LAB;  Service: Cardiovascular;  Laterality: N/A;    MEDICATIONS: Current Outpatient Prescriptions on File Prior to Visit  Medication Sig Dispense Refill  . allopurinol (ZYLOPRIM) 100 MG tablet Take 1 tablet (100 mg total) by mouth daily. 30 tablet 6  . ALPRAZolam (XANAX) 0.5 MG tablet Take 1 tablet (0.5 mg total) by mouth 3 (three) times daily as needed for anxiety. 60 tablet 0  . amiodarone (PACERONE) 200 MG tablet Take 1 tablet (200 mg total) by mouth daily. 30 tablet 3  . apixaban (ELIQUIS) 5 MG TABS tablet Take 1 tablet (5 mg total) by mouth 2 (two) times daily. 60 tablet 3  . atorvastatin (LIPITOR) 40 MG tablet Take 1 tablet (40 mg total) by mouth daily. 30 tablet 3  . carvedilol (COREG) 6.25 MG tablet TAKE 1 TABLET (6.25 MG TOTAL) BY MOUTH 2 (TWO) TIMES DAILY WITH A MEAL. 60 tablet 3  . diphenhydrAMINE (BENADRYL) 50 MG capsule Take 50 mg by mouth daily as needed for itching.    Marland Kitchen eplerenone (INSPRA) 25 MG tablet Take 12.5 mg by mouth every other day.     . fluticasone (FLONASE) 50 MCG/ACT nasal spray Place 2 sprays  into both nostrils daily as needed for allergies. 16 g 2  . HORIZANT 600 MG TBCR Take 600 mg by mouth at bedtime.    . magnesium oxide (MAG-OX) 400 MG tablet Take 1 tablet (400 mg total) by mouth 3 (three) times daily. 90 tablet 3  . metolazone (ZAROXOLYN) 2.5 MG tablet Take 1 tablet (2.5 mg total) by mouth daily as needed (for fluid retention). 10 tablet 6  . milrinone (PRIMACOR) 20 MG/100ML SOLN infusion Inject 0.375 mcg/kg/min into the vein continuous. 440mg  in 49ml infusion 2.4 ml per hour    . oxyCODONE-acetaminophen (PERCOCET) 10-325 MG tablet Take 1 tablet by mouth. 4-6 times a day    . potassium chloride SA (K-DUR,KLOR-CON) 20 MEQ tablet Take 2 tablets (40 mEq total) by mouth 2 (two) times daily. 120 tablet 6  . ranitidine (ZANTAC) 150 MG capsule Take 150 mg by mouth daily as needed for heartburn (reflux).    Marland Kitchen senna-docusate (SENOKOT-S) 8.6-50 MG per tablet Take 1-2 tablets by mouth 2 (  two) times daily as needed for mild constipation or moderate constipation. 60 tablet 3  . sertraline (ZOLOFT) 100 MG tablet Take 1 tablet (100 mg total) by mouth daily. 90 tablet 1  . sucralfate (CARAFATE) 1 GM/10ML suspension Take 1 g by mouth 4 (four) times daily as needed (indigestion).    Marland Kitchen sulfamethoxazole-trimethoprim (BACTRIM) 400-80 MG tablet Take 1 tablet by mouth 2 (two) times daily. 42 tablet 0  . torsemide (DEMADEX) 20 MG tablet Take 2 tablets (40 mg total) by mouth 2 (two) times daily. 120 tablet 6  . zolpidem (AMBIEN) 5 MG tablet TAKE 1 AND 1/2 TABLETS BY MOUTH EVERY DAY AT BEDTIME 45 tablet 0   No current facility-administered medications on file prior to visit.    ALLERGIES: Allergies  Allergen Reactions  . Nitroglycerin Other (See Comments)    "feels like head is going to bust open"  . Colchicine Nausea Only    FAMILY HISTORY: Family History  Problem Relation Age of Onset  . Heart disease    . Heart failure    . Stroke    . Anesthesia problems Neg Hx   . Hypotension Neg Hx     . Malignant hyperthermia Neg Hx   . Pseudochol deficiency Neg Hx   . Heart failure Sister   . Heart attack Sister   . Hyperlipidemia Mother     SOCIAL HISTORY: Social History   Social History  . Marital Status: Single    Spouse Name: N/A  . Number of Children: N/A  . Years of Education: N/A   Occupational History  . Not on file.   Social History Main Topics  . Smoking status: Never Smoker   . Smokeless tobacco: Never Used  . Alcohol Use: No  . Drug Use: No     Comment: last use august 2004  . Sexual Activity: Not Currently   Other Topics Concern  . Not on file   Social History Narrative   3 sons- one in [place], 1 in [place], on in [place]   Retired from post office- on disability   Completed Bachelors from [employer]    REVIEW OF SYSTEMS: Constitutional: No fevers, chills, or sweats, no generalized fatigue, change in appetite Eyes: No visual changes, double vision, eye pain Ear, nose and throat: No hearing loss, ear pain, nasal congestion, sore throat Cardiovascular: No chest pain, palpitations Respiratory:  No shortness of breath at rest or with exertion, wheezes GastrointestinaI: No nausea, vomiting, diarrhea, abdominal pain, fecal incontinence Genitourinary:  No dysuria, urinary retention or frequency Musculoskeletal:  No neck pain, back pain Integumentary: No rash, pruritus, skin lesions Neurological: as above Psychiatric: No depression, insomnia, anxiety Endocrine: No palpitations, fatigue, diaphoresis, mood swings, change in appetite, change in weight, increased thirst Hematologic/Lymphatic:  No anemia, purpura, petechiae. Allergic/Immunologic: no itchy/runny eyes, nasal congestion, recent allergic reactions, rashes  PHYSICAL EXAM: Filed Vitals:   10/15/14 0955  BP: 104/70  Pulse: 92   General: No acute distress.  Head:  Normocephalic/atraumatic Eyes:  fundi unremarkable, without vessel changes, exudates, hemorrhages or papilledema. Neck: supple, no paraspinal  tenderness, full range of motion Back: No paraspinal tenderness Heart: regular rate and rhythm Lungs: Clear to auscultation bilaterally. Vascular: No carotid bruits. Neurological Exam: Mental status: alert and oriented to person, place, and time, recent and remote memory intact, fund of knowledge intact, attention and concentration intact, speech fluent and not dysarthric, language intact. Cranial nerves: CN I: not tested CN II: pupils equal, round and reactive to light,  visual fields intact, fundi unremarkable, without vessel changes, exudates, hemorrhages or papilledema. CN III, IV, VI:  full range of motion, no nystagmus, no ptosis CN V: facial sensation intact CN VII: upper and lower face symmetric CN VIII: hearing intact CN IX, X: gag intact, uvula midline CN XI: sternocleidomastoid and trapezius muscles intact CN XII: tongue midline Bulk & Tone: normal, no fasciculations. Motor:  5/5 throughout  Sensation:  Pinprick and vibration sensation reduced in feet up to below knees. Deep Tendon Reflexes:  1+ in upper extremities and absent in lower extremities, toes equivocal Finger to nose testing:  Without dysmetria.  Heel to shin:  Without dysmetria.  Gait:  Mild unsteadiness.  Able to turn and tandem walk. Romberg negative.  IMPRESSION: Possible TIA in setting of cardiac catheterization Hypertension Cardiomyopathy and heart failure with EF 15-20% Paroxysmal atrial fibrillation with AICD and Eliquis Type 2 diabetes mellitus Hyperlipidemia  PLAN: 1.  Continue Lipitor.  Check fasting lipid panel.  LDL goal should be less 70.  Adjustment and management may be coordinated by PCP. 2.  Continue Eliquis 3.  Try to obtain CT head report or notes pertaining to stroke.  The mechanism of his stroke is clear (secondary to the cardiac catheterization) I wouldn't check carotid doppler, because I don't think it would change management.  Even if he had stenosis, he is not an appropriate medical  candidate for surgery anyway.  No follow up needed.  Thank you for allowing me to take part in the care of this patient.  Metta Clines, DO  CC:  Debbrah Alar

## 2014-10-15 NOTE — Patient Instructions (Signed)
Check fasting lipid panel Continue Eliquis and Lipitor Follow up as needed.

## 2014-10-17 ENCOUNTER — Telehealth: Payer: Self-pay | Admitting: Family

## 2014-10-17 MED ORDER — ATORVASTATIN CALCIUM 80 MG PO TABS: 80.0000 mg | ORAL_TABLET | Freq: Every day | ORAL | Status: AC

## 2014-10-17 NOTE — Telephone Encounter (Signed)
Please advise pt that per cholesterol result with Dr. Tomi Likens, please increase lipitor to 80mg .  Rx sent to pharmacy.

## 2014-10-17 NOTE — Telephone Encounter (Signed)
-----   Message from Pieter Partridge, DO sent at 10/16/2014  7:35 AM EDT ----- Lipid panel shows LDL of 138.  I would like it less than 70.  I recommend that the Lipitor be increased to 80mg  daily.  This can be managed by his PCP.  I will send this message to his PCP as well.

## 2014-10-19 NOTE — Telephone Encounter (Signed)
Notified pt and he voices understanding. 

## 2014-10-20 DIAGNOSIS — Z5181 Encounter for therapeutic drug level monitoring: Secondary | ICD-10-CM | POA: Diagnosis not present

## 2014-10-20 DIAGNOSIS — N184 Chronic kidney disease, stage 4 (severe): Secondary | ICD-10-CM | POA: Diagnosis not present

## 2014-10-20 DIAGNOSIS — I5022 Chronic systolic (congestive) heart failure: Secondary | ICD-10-CM | POA: Diagnosis not present

## 2014-10-20 DIAGNOSIS — Z79899 Other long term (current) drug therapy: Secondary | ICD-10-CM | POA: Diagnosis not present

## 2014-10-20 DIAGNOSIS — I129 Hypertensive chronic kidney disease with stage 1 through stage 4 chronic kidney disease, or unspecified chronic kidney disease: Secondary | ICD-10-CM | POA: Diagnosis not present

## 2014-10-20 DIAGNOSIS — Z452 Encounter for adjustment and management of vascular access device: Secondary | ICD-10-CM | POA: Diagnosis not present

## 2014-10-20 DIAGNOSIS — Z7901 Long term (current) use of anticoagulants: Secondary | ICD-10-CM | POA: Diagnosis not present

## 2014-10-20 DIAGNOSIS — I480 Paroxysmal atrial fibrillation: Secondary | ICD-10-CM | POA: Diagnosis not present

## 2014-10-20 DIAGNOSIS — Z950 Presence of cardiac pacemaker: Secondary | ICD-10-CM | POA: Diagnosis not present

## 2014-10-20 DIAGNOSIS — Z9981 Dependence on supplemental oxygen: Secondary | ICD-10-CM | POA: Diagnosis not present

## 2014-10-21 DIAGNOSIS — I5022 Chronic systolic (congestive) heart failure: Secondary | ICD-10-CM | POA: Diagnosis not present

## 2014-10-22 ENCOUNTER — Other Ambulatory Visit (HOSPITAL_COMMUNITY): Payer: Self-pay | Admitting: Internal Medicine

## 2014-10-22 DIAGNOSIS — I5022 Chronic systolic (congestive) heart failure: Secondary | ICD-10-CM | POA: Diagnosis not present

## 2014-10-22 DIAGNOSIS — I129 Hypertensive chronic kidney disease with stage 1 through stage 4 chronic kidney disease, or unspecified chronic kidney disease: Secondary | ICD-10-CM | POA: Diagnosis not present

## 2014-10-22 DIAGNOSIS — N184 Chronic kidney disease, stage 4 (severe): Secondary | ICD-10-CM | POA: Diagnosis not present

## 2014-10-22 DIAGNOSIS — Z950 Presence of cardiac pacemaker: Secondary | ICD-10-CM | POA: Diagnosis not present

## 2014-10-22 DIAGNOSIS — Z79899 Other long term (current) drug therapy: Secondary | ICD-10-CM | POA: Diagnosis not present

## 2014-10-22 DIAGNOSIS — Z9981 Dependence on supplemental oxygen: Secondary | ICD-10-CM | POA: Diagnosis not present

## 2014-10-22 DIAGNOSIS — Z7901 Long term (current) use of anticoagulants: Secondary | ICD-10-CM | POA: Diagnosis not present

## 2014-10-22 DIAGNOSIS — Z452 Encounter for adjustment and management of vascular access device: Secondary | ICD-10-CM | POA: Diagnosis not present

## 2014-10-22 DIAGNOSIS — I480 Paroxysmal atrial fibrillation: Secondary | ICD-10-CM | POA: Diagnosis not present

## 2014-10-22 DIAGNOSIS — Z5181 Encounter for therapeutic drug level monitoring: Secondary | ICD-10-CM | POA: Diagnosis not present

## 2014-10-23 ENCOUNTER — Telehealth (HOSPITAL_COMMUNITY): Payer: Self-pay

## 2014-10-23 NOTE — Telephone Encounter (Signed)
Called patient as requested by Darrell Kilts, PA-C to make sure that he was taking two 20 mEq tablets of potasium twice a day. Patient verbally confirms that he is taking two 20 mEq tablets of potasium twice a day

## 2014-10-26 ENCOUNTER — Other Ambulatory Visit (HOSPITAL_COMMUNITY): Payer: Self-pay | Admitting: Internal Medicine

## 2014-10-27 DIAGNOSIS — M5137 Other intervertebral disc degeneration, lumbosacral region: Secondary | ICD-10-CM | POA: Diagnosis not present

## 2014-10-28 ENCOUNTER — Telehealth: Payer: Self-pay | Admitting: *Deleted

## 2014-10-28 ENCOUNTER — Encounter: Payer: Self-pay | Admitting: Internal Medicine

## 2014-10-28 DIAGNOSIS — F419 Anxiety disorder, unspecified: Secondary | ICD-10-CM

## 2014-10-28 MED ORDER — ALPRAZOLAM 0.5 MG PO TABS: 0.5000 mg | ORAL_TABLET | Freq: Three times a day (TID) | ORAL | Status: DC | PRN

## 2014-10-28 NOTE — Telephone Encounter (Signed)
Signed Rx faxed to the pharmacy.

## 2014-10-28 NOTE — Telephone Encounter (Signed)
[facility] alpraz[facility]am 0.5mg  three times daily prn. Last Rx given 09/09/14, #60.  Pt has f/u on 12/2014.  Rx printed and forwarded to PCP for signature.

## 2014-10-29 DIAGNOSIS — I5022 Chronic systolic (congestive) heart failure: Secondary | ICD-10-CM | POA: Diagnosis not present

## 2014-10-29 DIAGNOSIS — I509 Heart failure, unspecified: Secondary | ICD-10-CM | POA: Diagnosis not present

## 2014-10-29 DIAGNOSIS — R0602 Shortness of breath: Secondary | ICD-10-CM | POA: Diagnosis not present

## 2014-10-29 DIAGNOSIS — R200 Anesthesia of skin: Secondary | ICD-10-CM | POA: Diagnosis not present

## 2014-10-29 DIAGNOSIS — M5412 Radiculopathy, cervical region: Secondary | ICD-10-CM | POA: Diagnosis not present

## 2014-10-30 ENCOUNTER — Telehealth (HOSPITAL_COMMUNITY): Payer: Self-pay | Admitting: *Deleted

## 2014-10-30 NOTE — Telephone Encounter (Signed)
Received labs from [facility] drawn 10/27:  K 4.3 Bun 53 CR 2.53  Per Darrick Grinder, NP hold torsemide for 2 days, repeat labs in 1 week.  Pt aware, agreeable and verbalizes understanding.

## 2014-11-02 ENCOUNTER — Encounter: Payer: Self-pay | Admitting: Internal Medicine

## 2014-11-02 ENCOUNTER — Telehealth: Payer: Self-pay | Admitting: Family

## 2014-11-02 ENCOUNTER — Ambulatory Visit (INDEPENDENT_AMBULATORY_CARE_PROVIDER_SITE_OTHER): Payer: Medicare Other | Admitting: Internal Medicine

## 2014-11-02 VITALS — BP 130/90 | HR 71 | Ht 76.0 in | Wt 228.6 lb

## 2014-11-02 DIAGNOSIS — I480 Paroxysmal atrial fibrillation: Secondary | ICD-10-CM | POA: Diagnosis not present

## 2014-11-02 DIAGNOSIS — I472 Ventricular tachycardia, unspecified: Secondary | ICD-10-CM

## 2014-11-02 DIAGNOSIS — I5023 Acute on chronic systolic (congestive) heart failure: Secondary | ICD-10-CM

## 2014-11-02 DIAGNOSIS — I5022 Chronic systolic (congestive) heart failure: Secondary | ICD-10-CM | POA: Diagnosis not present

## 2014-11-02 DIAGNOSIS — I10 Essential (primary) hypertension: Secondary | ICD-10-CM | POA: Diagnosis not present

## 2014-11-02 DIAGNOSIS — I4892 Unspecified atrial flutter: Secondary | ICD-10-CM | POA: Diagnosis not present

## 2014-11-02 DIAGNOSIS — R42 Dizziness and giddiness: Secondary | ICD-10-CM | POA: Diagnosis not present

## 2014-11-02 LAB — CUP PACEART INCLINIC DEVICE CHECK
Battery Remaining Longevity: 6.6
Brady Statistic RV Percent Paced: 0.06 %
Date Time Interrogation Session: 20161031101300
HIGH POWER IMPEDANCE MEASURED VALUE: 50.7148
HIGH POWER IMPEDANCE MEASURED VALUE: 51 Ohm
Implantable Lead Implant Date: 20090112
Lead Channel Pacing Threshold Amplitude: 1 V
Lead Channel Pacing Threshold Pulse Width: 0.5 ms
Lead Channel Sensing Intrinsic Amplitude: 12 mV
Lead Channel Setting Pacing Amplitude: 2.5 V
Lead Channel Setting Pacing Pulse Width: 0.5 ms
MDC IDC LEAD LOCATION: 753860
MDC IDC LEAD MODEL: 7121
MDC IDC MSMT BATTERY VOLTAGE: 2.51 V
MDC IDC MSMT LEADCHNL RV IMPEDANCE VALUE: 512.5 Ohm
MDC IDC MSMT LEADCHNL RV PACING THRESHOLD AMPLITUDE: 1 V
MDC IDC MSMT LEADCHNL RV PACING THRESHOLD PULSEWIDTH: 0.5 ms
MDC IDC PG SERIAL: 521670
MDC IDC SET LEADCHNL RV SENSING SENSITIVITY: 0.3 mV

## 2014-11-02 NOTE — Progress Notes (Signed)
HPI Mr. Givler returns today for followup. He is a very pleasant 64 year old man with a long-standing nonischemic cardiomyopathy, chronic class III congestive heart failure, requiring inotropic support. He has been in the hospital with a UTI. He has been stable from a CHF perspective although he notes his weight is up 3 lbs after [employer] homecoming this weekend. His main complaint is related to dizziness and balance. No other problems.  Allergies  Allergen Reactions  . Nitroglycerin Other (See Comments)    "feels like head is going to bust open"  . Colchicine Nausea Only     Current Outpatient Prescriptions  Medication Sig Dispense Refill  . allopurinol (ZYLOPRIM) 100 MG tablet Take 1 tablet (100 mg total) by mouth daily. 30 tablet 6  . ALPRAZolam (XANAX) 0.5 MG tablet Take 1 tablet (0.5 mg total) by mouth 3 (three) times daily as needed for anxiety. 60 tablet 0  . amiodarone (PACERONE) 200 MG tablet Take 1 tablet (200 mg total) by mouth daily. 30 tablet 3  . apixaban (ELIQUIS) 5 MG TABS tablet Take 1 tablet (5 mg total) by mouth 2 (two) times daily. 60 tablet 3  . atorvastatin (LIPITOR) 80 MG tablet Take 1 tablet (80 mg total) by mouth daily. 30 tablet 3  . carvedilol (COREG) 6.25 MG tablet TAKE 1 TABLET (6.25 MG TOTAL) BY MOUTH 2 (TWO) TIMES DAILY WITH A MEAL. 60 tablet 3  . diphenhydrAMINE (BENADRYL) 50 MG capsule Take 50 mg by mouth daily as needed for itching.    Marland Kitchen eplerenone (INSPRA) 25 MG tablet Take 12.5 mg by mouth every other day.     . fluticasone (FLONASE) 50 MCG/ACT nasal spray Place 2 sprays into both nostrils daily as needed for allergies. 16 g 2  . HORIZANT 600 MG TBCR Take 600 mg by mouth at bedtime.    . magnesium oxide (MAG-OX) 400 MG tablet Take 1 tablet (400 mg total) by mouth 3 (three) times daily. 90 tablet 3  . metolazone (ZAROXOLYN) 2.5 MG tablet Take 1 tablet (2.5 mg total) by mouth daily as needed (for fluid retention). 10 tablet 6  . milrinone (PRIMACOR) 20  MG/100ML SOLN infusion Inject 0.375 mcg/kg/min into the vein continuous. 440mg  in 471ml infusion 2.4 ml per hour    . Oxycodone HCl 20 MG TABS Take 1 tablet by mouth every 4 (four) hours as needed (pain).     . ranitidine (ZANTAC) 150 MG capsule Take 150 mg by mouth daily as needed for heartburn (reflux).    Marland Kitchen senna-docusate (SENOKOT-S) 8.6-50 MG per tablet Take 1-2 tablets by mouth 2 (two) times daily as needed for mild constipation or moderate constipation. 60 tablet 3  . sertraline (ZOLOFT) 100 MG tablet Take 1 tablet (100 mg total) by mouth daily. 90 tablet 1  . sucralfate (CARAFATE) 1 GM/10ML suspension Take 1 g by mouth 4 (four) times daily as needed (indigestion).    Marland Kitchen sulfamethoxazole-trimethoprim (BACTRIM) 400-80 MG tablet Take 1 tablet by mouth 2 (two) times daily. 42 tablet 0  . torsemide (DEMADEX) 20 MG tablet Take 2 tablets (40 mg total) by mouth 2 (two) times daily. 120 tablet 6  . zolpidem (AMBIEN) 5 MG tablet TAKE 1 AND 1/2 TABLETS BY MOUTH EVERY DAY AT BEDTIME 45 tablet 0  . potassium chloride SA (K-DUR,KLOR-CON) 20 MEQ tablet Take 2 tablets (40 mEq total) by mouth 2 (two) times daily. (Patient not taking: Reported on 11/02/2014) 120 tablet 6   No current facility-administered medications  for this visit.     Past Medical History  Diagnosis Date  . CHF (congestive heart failure) ([place])   . Sarcoidosis ([place])   . Cardiomyopathy, dilated, nonischemic ([place])     non ischemic by cath  . Acute on chronic systolic heart failure ([place])   . Automatic implantable cardiac defibrillator in situ   . Atrial fibrillation ([place])   . NSVT (nonsustained ventricular tachycardia) ([place])   . GERD (gastroesophageal reflux disease)   . Hypercholesteremia   . Shortness of breath   . Chronic kidney disease (CKD), stage III (moderate)   . Pacemaker   . Anginal pain ([place])   . Gout   . Hypertension     dr [name]  . Coronary artery disease   . Elevated PSA 06/24/2013  . Myocardial infarction  ([place])   . Headache   . Neuropathy ([place])     ROS:   All systems reviewed and negative except as noted in the HPI.   Past Surgical History  Procedure Laterality Date  . Pacemaker insertion  2009    with ICD  . Tee without cardioversion  01/17/2011    Procedure: TRANSESOPHAGEAL ECHOCARDIOGRAM (TEE);  Surgeon: Birdie Riddle, MD;  Location: [place];  Service: Cardiovascular;  Laterality: N/A;  . Cardioversion  01/17/2011    Procedure: CARDIOVERSION;  Surgeon: Birdie Riddle, MD;  Location: [place];  Service: Cardiovascular;  Laterality: N/A;  . Cardiac catheterization    . Insert / replace / remove pacemaker    . Anterior cervical decomp/discectomy fusion  08/21/2011    Procedure: ANTERIOR CERVICAL DECOMPRESSION/DISCECTOMY FUSION 2 LEVELS;  Surgeon: Marybelle Killings, MD;  Location: [place];  Service: Orthopedics;  Laterality: N/A;  C5-6, C6-7 Anterior Cervical Discectomy and Fusion, allograft, plate  . Tee without cardioversion N/A 02/16/2012    Procedure: TRANSESOPHAGEAL ECHOCARDIOGRAM (TEE);  Surgeon: Jolaine Artist, MD;  Location: [facility] ENDOSCOPY;  Service: Cardiovascular;  Laterality: N/A;  original case scheduled under his dad (who is deceased), rescheduled under correct mrn/pt/dob. [place]/dl  . Tee without cardioversion N/A 03/22/2012    Procedure: TRANSESOPHAGEAL ECHOCARDIOGRAM (TEE);  Surgeon: Lelon Perla, MD;  Location: [place];  Service: Cardiovascular;  Laterality: N/A;  . Cardioversion N/A 03/22/2012    Procedure: CARDIOVERSION;  Surgeon: Lelon Perla, MD;  Location: [facility] ENDOSCOPY;  Service: Cardiovascular;  Laterality: N/A;  . Cardioversion N/A 04/26/2012    Procedure: CARDIOVERSION;  Surgeon: Jolaine Artist, MD;  Location: [facility] ENDOSCOPY;  Service: Cardiovascular;  Laterality: N/A;  . Central venous catheter tunneled insertion single lumen  09/16/2012    right IJ  . Cardioversion N/A 11/08/2012    Procedure: CARDIOVERSION;  Surgeon: Jolaine Artist, MD;   Location: [facility] ENDOSCOPY;  Service: Cardiovascular;  Laterality: N/A;  . Back surgery  1987, lower    Ruptured disk repair  . Peripherally inserted central catheter insertion  2013  . Right heart catheterization N/A 01/26/2012    Procedure: RIGHT HEART CATH;  Surgeon: Jolaine Artist, MD;  Location[facility] CATH LAB;  Service: Cardiovascular;  Laterality: N/A;  . Right heart catheterization N/A 04/30/2013    Procedure: RIGHT HEART CATH;  Surgeon: Jolaine Artist, MD;  Location: [facility] CATH LAB;  Service: Cardiovascular;  Laterality: N/A;     Family History  Problem Relation Age of Onset  . Heart disease    . Heart failure    . Stroke    . Anesthesia problems Neg Hx   . Hypotension Neg Hx   .  Malignant hyperthermia Neg Hx   . Pseudochol deficiency Neg Hx   . Heart failure Sister   . Heart attack Sister   . Hyperlipidemia Mother      Social History   Social History  . Marital Status: Single    Spouse Name: N/A  . Number of Children: N/A  . Years of Education: N/A   Occupational History  . Not on file.   Social History Main Topics  . Smoking status: Never Smoker   . Smokeless tobacco: Never Used  . Alcohol Use: No  . Drug Use: No     Comment: last use august 2004  . Sexual Activity: Not Currently   Other Topics Concern  . Not on file   Social History Narrative   3 sons- one in [place], 1 in [place], on in [place]   Retired from post office- on disability   Completed Bachelors from [employer]     BP 130/90 mmHg  Pulse 71  Ht 6\' 4"  (1.93 m)  Wt 228 lb 9.6 oz (103.692 kg)  BMI 27.84 kg/m2  Physical Exam:  Stable but chronically ill appearing middle-aged man, NAD HEENT: Unremarkable Neck:  7 cm JVD, no thyromegally Back:  No CVA tenderness Lungs:  Clear except for basilar rales HEART:  Regular rate rhythm, no murmurs, no rubs, no clicks, soft S3 is present Abd:  soft, obese, positive bowel sounds, no organomegally, no rebound, no guarding Ext:  2 plus pulses, no edema, no  cyanosis, no clubbing Skin:  No rashes no nodules Neuro:  CN II through XII intact, motor grossly intact   DEVICE  Normal device function.  See PaceArt for details.   Assess/Plan:

## 2014-11-02 NOTE — Assessment & Plan Note (Signed)
He has had no recurrent VT. I plan to stop his amio for dizzy and balance problems and if he has more VT, might try mexitil or restart amio

## 2014-11-02 NOTE — Telephone Encounter (Signed)
Relation to PC:HEKB Call back number:[phone] [id number] [place], [place]  Reason for call:  Patient requesting a refill LORazepam (ATIVAN) injection 1 mg

## 2014-11-02 NOTE — Assessment & Plan Note (Signed)
His blood pressure is up a bit. He is encouraged to reduce his salt intake.

## 2014-11-02 NOTE — Patient Instructions (Signed)
Medication Instructions:  Your physician has recommended you make the following change in your medication:  1) STOP Amiodarone  Labwork: None ordered  Testing/Procedures: None ordered  Follow-Up: Remote monitoring is used to monitor your Pacemaker of ICD from home. This monitoring reduces the number of office visits required to check your device to one time per year. It allows [place] to keep an eye on the functioning of your device to ensure it is working properly. You are scheduled for a device check from home on 02/01/2015. You may send your transmission at any time that day. If you have a wireless device, the transmission will be sent automatically. After your physician reviews your transmission, you will receive a postcard with your next transmission date.   Your physician wants you to follow-up in:  12 months with Dr Lovena Le.  You will receive a reminder letter in the mail two months in advance. If you don't receive a letter, please call our office to schedule the follow-up appointment.   Any Other Special Instructions Will Be Listed Below (If Applicable). Call us in 2 months and let us know if your dizziness and balance problems have improved.   If you need a refill on your cardiac medications before your next appointment, please call your pharmacy.  Thank you for choosing [facility]!!     Janan Halter, RN [phone]

## 2014-11-02 NOTE — Assessment & Plan Note (Signed)
I am concerned that this is related to his amio. I have asked him to stop this medication and call us in 8 weeks. If dizziness is improved, will continue to hold amio. If it is unchanged, then will plan to restart amio.

## 2014-11-02 NOTE — Telephone Encounter (Signed)
Spoke with pt and advised that we have he is no longer taking lorazepam but is on alprazolam and rx was faxed to pharmacy for that on 10/28/14. Spoke with Maudie Mercury at [facility] and she states that alprazolam rx was picked up on 10/29/14.  Notified pt and advised him to contact Maudie Mercury if he doesn't have Rx. Pt voices understanding.

## 2014-11-02 NOTE — Assessment & Plan Note (Signed)
He appears to be reasonably well compensated. He will followup in our CHF clinic.

## 2014-11-03 ENCOUNTER — Telehealth (HOSPITAL_COMMUNITY): Payer: Self-pay

## 2014-11-03 NOTE — Telephone Encounter (Signed)
Recertification verbal order for continued care with [facility] given to P. Wilson RN from Dr. Haroldine Laws

## 2014-11-05 ENCOUNTER — Ambulatory Visit (HOSPITAL_COMMUNITY)
Admission: RE | Admit: 2014-11-05 | Discharge: 2014-11-05 | Disposition: A | Discharge status: Home / Self Care | Payer: Medicare Other | Source: Ambulatory Visit | Attending: Cardiology | Admitting: Cardiology

## 2014-11-05 ENCOUNTER — Encounter (HOSPITAL_COMMUNITY): Payer: Self-pay

## 2014-11-05 VITALS — BP 130/90 | HR 103 | Wt 230.2 lb

## 2014-11-05 DIAGNOSIS — I252 Old myocardial infarction: Secondary | ICD-10-CM | POA: Diagnosis not present

## 2014-11-05 DIAGNOSIS — I2510 Atherosclerotic heart disease of native coronary artery without angina pectoris: Secondary | ICD-10-CM | POA: Diagnosis not present

## 2014-11-05 DIAGNOSIS — E7800 Pure hypercholesterolemia, unspecified: Secondary | ICD-10-CM | POA: Insufficient documentation

## 2014-11-05 DIAGNOSIS — G629 Polyneuropathy, unspecified: Secondary | ICD-10-CM | POA: Diagnosis not present

## 2014-11-05 DIAGNOSIS — R002 Palpitations: Secondary | ICD-10-CM

## 2014-11-05 DIAGNOSIS — I130 Hypertensive heart and chronic kidney disease with heart failure and stage 1 through stage 4 chronic kidney disease, or unspecified chronic kidney disease: Secondary | ICD-10-CM | POA: Insufficient documentation

## 2014-11-05 DIAGNOSIS — Z79899 Other long term (current) drug therapy: Secondary | ICD-10-CM | POA: Insufficient documentation

## 2014-11-05 DIAGNOSIS — D869 Sarcoidosis, unspecified: Secondary | ICD-10-CM | POA: Insufficient documentation

## 2014-11-05 DIAGNOSIS — I428 Other cardiomyopathies: Secondary | ICD-10-CM | POA: Insufficient documentation

## 2014-11-05 DIAGNOSIS — I472 Ventricular tachycardia, unspecified: Secondary | ICD-10-CM

## 2014-11-05 DIAGNOSIS — N183 Chronic kidney disease, stage 3 (moderate): Secondary | ICD-10-CM | POA: Diagnosis not present

## 2014-11-05 DIAGNOSIS — R0602 Shortness of breath: Secondary | ICD-10-CM | POA: Diagnosis not present

## 2014-11-05 DIAGNOSIS — N184 Chronic kidney disease, stage 4 (severe): Secondary | ICD-10-CM | POA: Diagnosis not present

## 2014-11-05 DIAGNOSIS — I5022 Chronic systolic (congestive) heart failure: Secondary | ICD-10-CM | POA: Diagnosis not present

## 2014-11-05 DIAGNOSIS — Z95810 Presence of automatic (implantable) cardiac defibrillator: Secondary | ICD-10-CM | POA: Diagnosis not present

## 2014-11-05 DIAGNOSIS — K219 Gastro-esophageal reflux disease without esophagitis: Secondary | ICD-10-CM | POA: Insufficient documentation

## 2014-11-05 DIAGNOSIS — I4891 Unspecified atrial fibrillation: Secondary | ICD-10-CM | POA: Insufficient documentation

## 2014-11-05 DIAGNOSIS — I509 Heart failure, unspecified: Secondary | ICD-10-CM | POA: Diagnosis not present

## 2014-11-05 DIAGNOSIS — I69351 Hemiplegia and hemiparesis following cerebral infarction affecting right dominant side: Secondary | ICD-10-CM | POA: Insufficient documentation

## 2014-11-05 NOTE — Patient Instructions (Signed)
Follow up in 4-6 weeks

## 2014-11-05 NOTE — Progress Notes (Signed)
Patient ID: [name], male   DOB: [birth year], 64 y.o.   MRN: [id number]    PCP: Debbrah Alar  HF Cardiologist: Dr Haroldine Laws   HPI: [name] is a 64 y.o. 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 He also has history of ventricular tachycardia s/p St Jude ICD (single lead, no Corevue) by Dr. Lovena Le, CKD stage IV, and paroxysmal atrial arrhythmias on amiodarone as well as sarcoidosis and hypertension. Cath in 2008 by Dr. Terrence Dupont showed no CAD.  Seen at [facility] and not felt to be a transplant candidate due to renal failure and lack of family support.  Milrinone initiated in January 2014 for low output. 03/01/12 milrinone increased 0.375 mcg/kg/min.   04/26/12 S/P Successful DC-CV for atrial fibrillation. Remains on amiodarone.   Admitted to [facility] 01/09/14-01/15/2014 with an infected PICC. Gram negative bacteremia with antibiotic course. Hospitalization was complicated by VT/VF. Discharge weight was 240 pounds.   Since last appointment here, he was admitted to the hospital in [facility] for 3 weeks in 8/16.  He was initially admitted with chest pain, dyspnea, and abdominal pain.  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  His stay was complicated by delirium with hallucinations.  He had hematuria and Eliquis was stopped.  He says they told him that he had a bladder infection.  Not currently having hematuria.  By the end of his hospitalization, he was very debilitated and barely able to walk.  He is now back at home.    He remains on Milrinone 0.375 mcg/kg/min. On Oct 13 started on antibiotics for prostatitis. Earlier this week he was taken off amiodarone by Dr Lovena Le for dizziness.  Mild dyspnea with exertion but this is his baseline. Weight at home 222-224 pounds. Able to walk around without difficulty. Says he has been eating a ton over the  last weekend. Taking all medications. He remains on Eliquis. No hematuria. Followed by [facility]. No BRBPR .   ECHO 03/22/12 EF 15%  Echo 6/15: EF 20-25%  RHC 04/30/13 RA = 4  RV = 20/1/3  PA = 23/8 (15)  PCW = 5  Fick cardiac output/index =4.5/1.9  Thermo CO/CI = 3.9/1.6  PVR =  FA sat = 98%  PA sat = 62%, 64% - On milrinone 0.25  He is not on an ace inhibitor due to elevated creatinine. Not on nitrates due to Viagra use.   Labs:  08/15/12 Creatinine 4.24 Potassium 3.3 09/19/12 Creatinine 3.98, Potassium 3.1, BUN 78 10/07/12 Creatinine 2.62, Potassium 3.4 10/18/12 Creatinine 3.3, K+3.1 11/08/12 Creatinine 2.15 K 4.0 11/25/12 Creatinine 2.93, K+ 3.5 03/10/13: K+ 2.9, Creatinine 2.63 04/30/13: K+ 3.7, creatinine 2.4 6/15: K 3, creatinine 3.6 09/11/13: K 3.1 Cr 1.87  10/15: K 3.8, creatinine 2.13, HCT 40.7 11/06/13 K 4.0 Creatinine 1.58  12/17/13 K 3.6 Creatinine 2.3  3/16 K 3.9, creatinine 2.02 04/2014: K 3.4 => 3.7,  Creatinine 2.37 => 2.02, TSH/free T4/free T3 normal, AST 45, ALT 40 05/19/2014: L 4.0 Creatinine 1.51 Mag 2.2 BNP 53 9/16: K 3.2, creatinine 1.37, hgb 11.7, plts 196 09/23/2014: K 3.3 Creatinine 1.72  10/15/14 Creatine 2.27 10/29/14 Creatinine 2.53 torsemide held for 2 days.     ROS: All systems negative except as listed in HPI, PMH and Problem List.  Past Medical History  Diagnosis Date  . CHF (congestive heart failure) ([place])   .  Sarcoidosis ([place])   . Cardiomyopathy, dilated, nonischemic ([place])     non ischemic by cath  . Acute on chronic systolic heart failure ([place])   . Automatic implantable cardiac defibrillator in situ   . Atrial fibrillation ([place])   . NSVT (nonsustained ventricular tachycardia) ([place])   . GERD (gastroesophageal reflux disease)   . Hypercholesteremia   . Shortness of breath   . Chronic kidney disease (CKD), stage III (moderate)   . Pacemaker   . Anginal pain ([place])   . Gout   . Hypertension     dr [name]  . Coronary artery disease   .  Elevated PSA 06/24/2013  . Myocardial infarction ([place])   . Headache   . Neuropathy [facility])    Current Outpatient Prescriptions  Medication Sig Dispense Refill  . allopurinol (ZYLOPRIM) 100 MG tablet Take 1 tablet (100 mg total) by mouth daily. 30 tablet 6  . ALPRAZolam (XANAX) 0.5 MG tablet Take 1 tablet (0.5 mg total) by mouth 3 (three) times daily as needed for anxiety. 60 tablet 0  . apixaban (ELIQUIS) 5 MG TABS tablet Take 1 tablet (5 mg total) by mouth 2 (two) times daily. 60 tablet 3  . atorvastatin (LIPITOR) 80 MG tablet Take 1 tablet (80 mg total) by mouth daily. 30 tablet 3  . carvedilol (COREG) 6.25 MG tablet TAKE 1 TABLET (6.25 MG TOTAL) BY MOUTH 2 (TWO) TIMES DAILY WITH A MEAL. 60 tablet 3  . diphenhydrAMINE (BENADRYL) 50 MG capsule Take 50 mg by mouth daily as needed for itching.    Marland Kitchen eplerenone (INSPRA) 25 MG tablet Take 12.5 mg by mouth every other day.     . fluticasone (FLONASE) 50 MCG/ACT nasal spray Place 2 sprays into both nostrils daily as needed for allergies. 16 g 2  . HORIZANT 600 MG TBCR Take 600 mg by mouth at bedtime.    . magnesium oxide (MAG-OX) 400 MG tablet Take 1 tablet (400 mg total) by mouth 3 (three) times daily. 90 tablet 3  . metolazone (ZAROXOLYN) 2.5 MG tablet Take 1 tablet (2.5 mg total) by mouth daily as needed (for fluid retention). 10 tablet 6  . milrinone (PRIMACOR) 20 MG/100ML SOLN infusion Inject 0.375 mcg/kg/min into the vein continuous. 440mg  in 453ml infusion 2.4 ml per hour    . Oxycodone HCl 20 MG TABS Take 1 tablet by mouth every 4 (four) hours as needed (pain).     . potassium chloride SA (K-DUR,KLOR-CON) 20 MEQ tablet Take 2 tablets (40 mEq total) by mouth 2 (two) times daily. 120 tablet 6  . ranitidine (ZANTAC) 150 MG capsule Take 150 mg by mouth daily as needed for heartburn (reflux).    Marland Kitchen senna-docusate (SENOKOT-S) 8.6-50 MG per tablet Take 1-2 tablets by mouth 2 (two) times daily as needed for mild constipation or moderate constipation. 60  tablet 3  . sertraline (ZOLOFT) 100 MG tablet Take 1 tablet (100 mg total) by mouth daily. 90 tablet 1  . sucralfate (CARAFATE) 1 GM/10ML suspension Take 1 g by mouth 4 (four) times daily as needed (indigestion).    Marland Kitchen sulfamethoxazole-trimethoprim (BACTRIM) 400-80 MG tablet Take 1 tablet by mouth 2 (two) times daily. 42 tablet 0  . torsemide (DEMADEX) 20 MG tablet Take 2 tablets (40 mg total) by mouth 2 (two) times daily. 120 tablet 6  . zolpidem (AMBIEN) 5 MG tablet TAKE 1 AND 1/2 TABLETS BY MOUTH EVERY DAY AT BEDTIME 45 tablet 0   No current facility-administered medications for this  encounter.   BP 130/90 mmHg  Pulse 103  Wt 230 lb 3.2 oz (104.418 kg)  SpO2 97%  PHYSICAL EXAM: General: NAD; Chronically ill appearing.  Ambulated in clinic without difficulty.  HEENT: normal  Neck: supple. JVP 6-7 cm. Carotids 2+ bilat; no bruits. No lymphadenopathy or thyromegaly appreciated.  Cor: PMI laterally displaced. Regular S1 S2 with no S3 and 2/6 HSM LLSB. R upper chest hickman site with milrinone infusing.  Lungs: Slight crackles at bases bilaterally.  Abdomen: soft, nontender, obese. No bruits or masses. Good bowel sounds.  Moderate abdominal distention.  Extremities: no cyanosis, clubbing, rash, no lower extremity edema  Neuro: alert & orientedx3, cranial nerves grossly intact. moves all 4 extremities w/o difficulty. Affect pleasant   EKG: SR 86 bpm   ASSESSMENT & PLAN:   1) Chronic systolic HF: Nonischemic cardiomyopathy, EF 20-25% (06/2013). St Jude ICD (single chamber, no Corevue). He has been on home milrinone 0.375 mcg/kg/min since January 2014. NYHA class III symptoms. Overall doing ok.  Volume status stable despite weight gain. Continue torsemide to 40 mg bid -Continue carvedilol 6.25 mg twice a day.  - No ACEI with CKD. Continue inspra.   - Continue milrinone at 0.375 mcg/kg/min.  Check labs weekly.  - 2) Atrial arrhythmias: Paroxysmal. - Continue amiodarone. He will need TSH and  LFTs at next appointment, should get yearly eye exams.  - Had problems with hematuria during recent hospital admit Eliquis was stopped. No further hematuria.  Urology appointment scheduled.  Restart eliquis today. Has appointment in October.    3) CKD, stage III: Elevated renal function. Repeat labs today.  4) VT/VF: Most recent  05/19/14. Amio stopped per EP due to dizzinees. May need mexilitene if VT reoccurs.    5) CVA 08/2014 - On eliquis 5 mg twice a day. Continue atorvastatin 40 mg today    Follow up in 4-6 weeks NP  Amy Clegg NP-C  11/05/2014

## 2014-11-09 DIAGNOSIS — Z79899 Other long term (current) drug therapy: Secondary | ICD-10-CM | POA: Diagnosis not present

## 2014-11-09 DIAGNOSIS — G894 Chronic pain syndrome: Secondary | ICD-10-CM | POA: Diagnosis not present

## 2014-11-09 DIAGNOSIS — R0789 Other chest pain: Secondary | ICD-10-CM | POA: Diagnosis not present

## 2014-11-09 DIAGNOSIS — M545 Low back pain: Secondary | ICD-10-CM | POA: Diagnosis not present

## 2014-11-09 DIAGNOSIS — M792 Neuralgia and neuritis, unspecified: Secondary | ICD-10-CM | POA: Diagnosis not present

## 2014-11-09 IMAGING — CR DG LUMBAR SPINE COMPLETE 4+V
5 series · 5 of 5 positions shown · non-contrast
Comparison: None.

CLINICAL DATA: Lower back pain.

EXAM:
LUMBAR SPINE - COMPLETE 4+ VIEW

[t l-spine a.p. *]
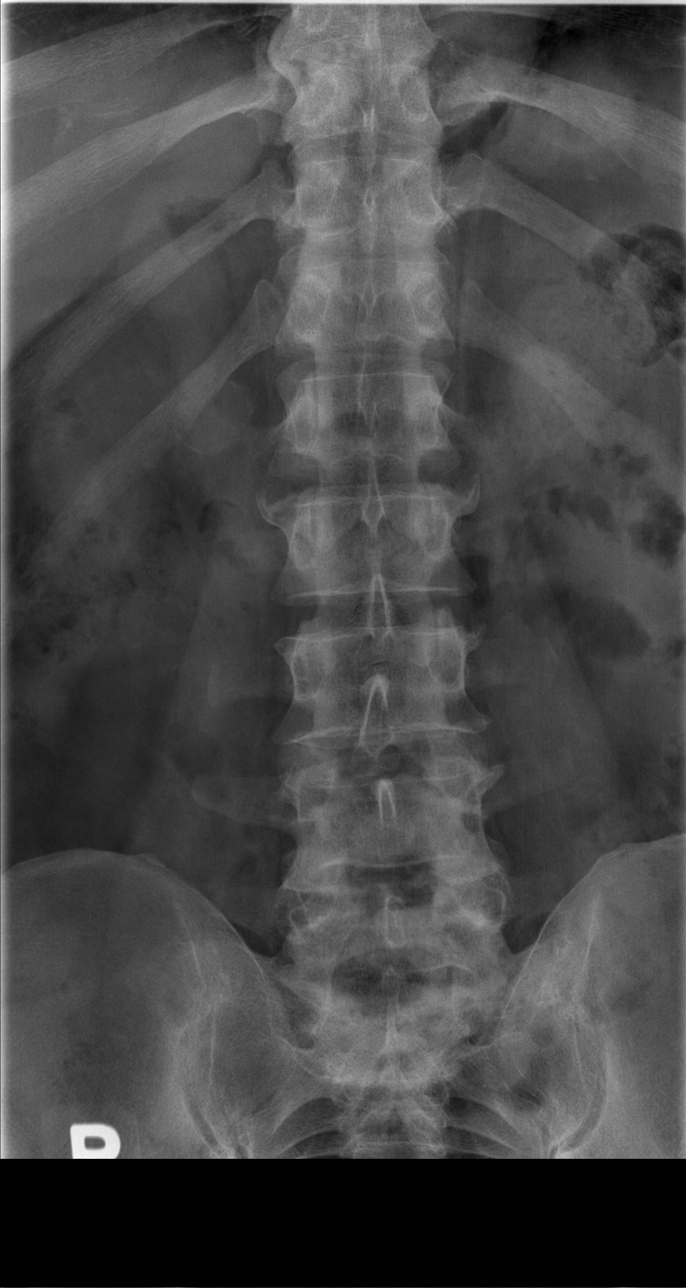

[t l-spine oblique exposure (1 of 2)]
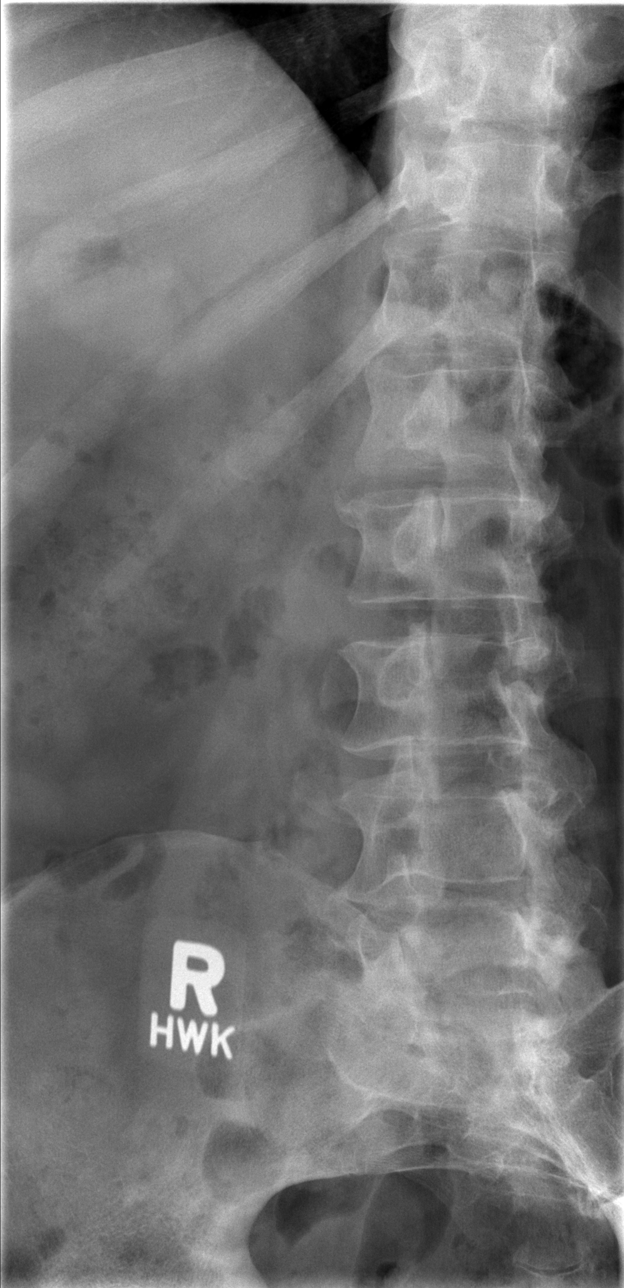

[t l-spine oblique exposure (2 of 2)]
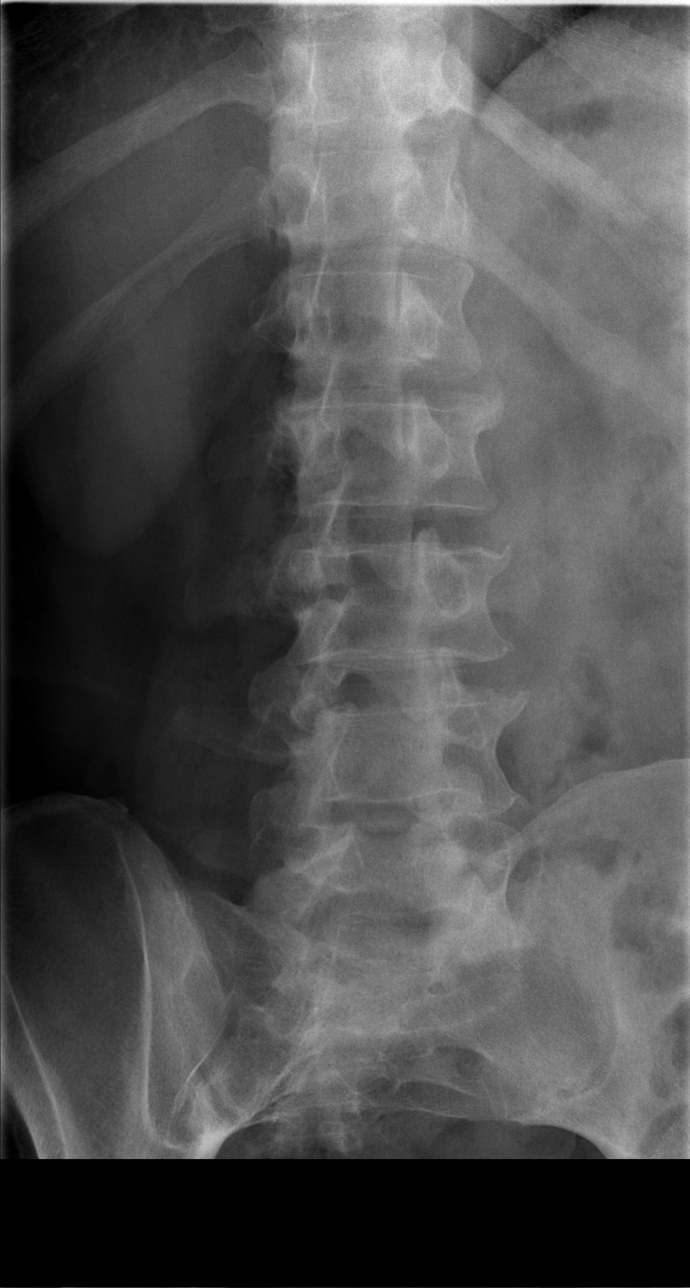

[t l-spine lat]
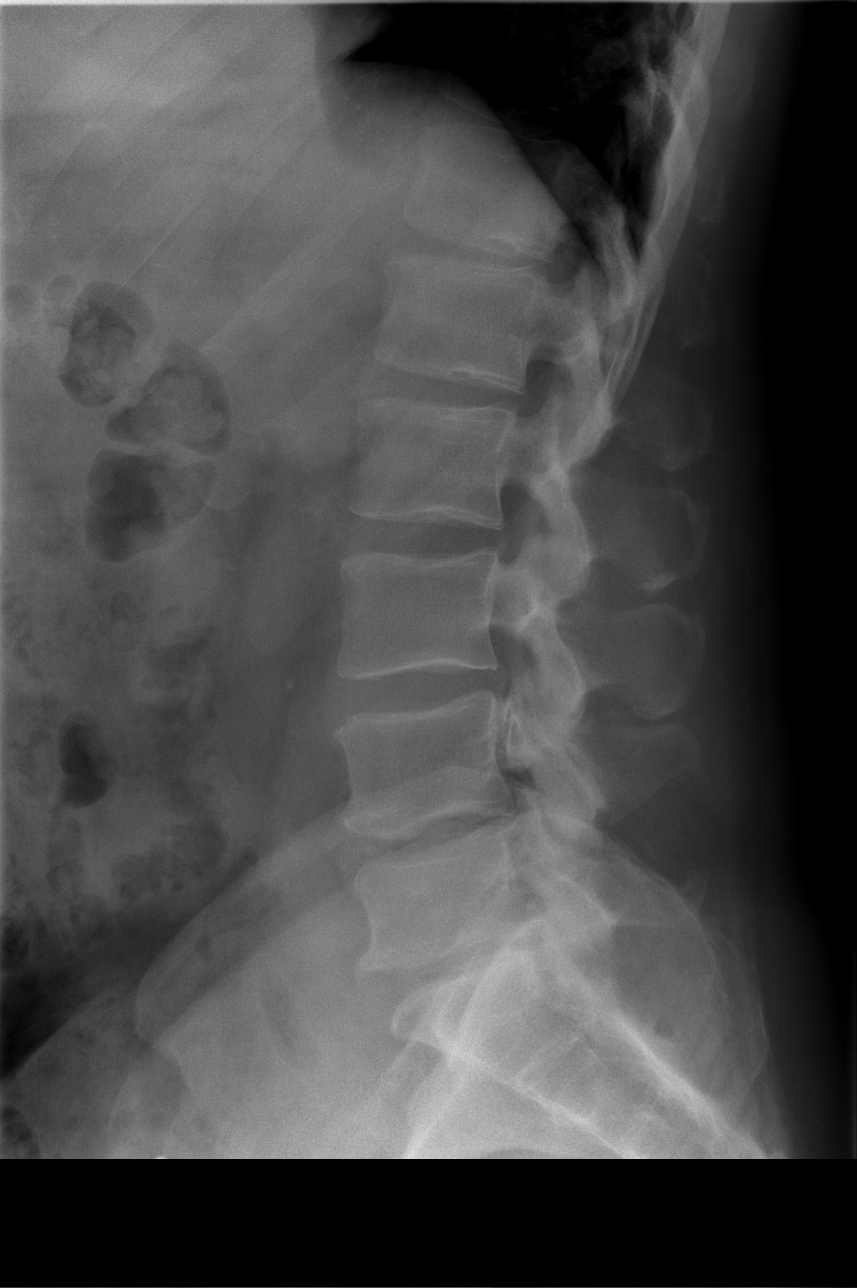

[t l-spine l5-s1 spot]
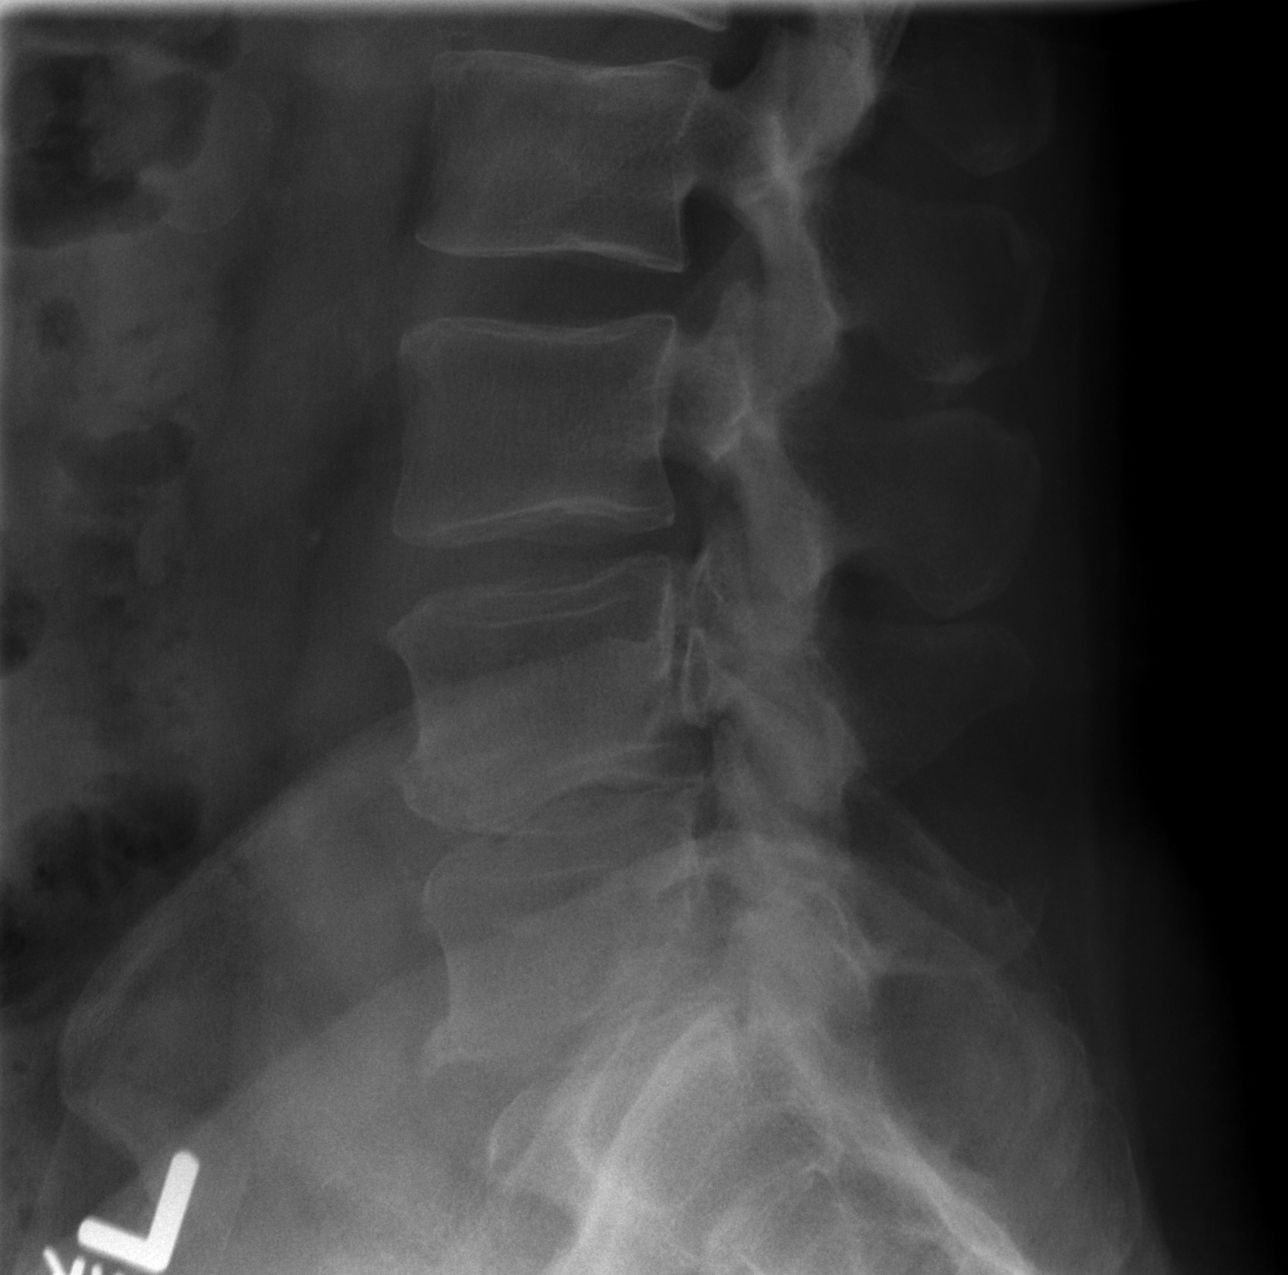

[5 of 5 positions shown; findings below may reference images not displayed]

FINDINGS: No fracture is noted. Moderate degenerative disc disease of L4-5 is
noted with minimal grade 1 retrolisthesis. Moderate degenerative
disc disease is noted at L5-S1 is well. Mild spurring is seen at
L1-2 and L2-3 as well.
IMPRESSION: Moderate degenerative disc disease is noted at L4-5 and L5-S1. No
acute abnormality seen in the lumbar spine.

## 2014-11-12 ENCOUNTER — Telehealth (HOSPITAL_COMMUNITY): Payer: Self-pay

## 2014-11-12 DIAGNOSIS — R0602 Shortness of breath: Secondary | ICD-10-CM | POA: Diagnosis not present

## 2014-11-12 DIAGNOSIS — I509 Heart failure, unspecified: Secondary | ICD-10-CM | POA: Diagnosis not present

## 2014-11-12 NOTE — Telephone Encounter (Signed)
[place] [phone]) left message that patient informed her by phone today that he will be going out of town the rest of this week, all of next week and part of the week after.  HHN states that he takes all of his equipment with him to [place] with his girlfriend who is a Marine scientist who takes care of the dressings and PICC line.  [facility]N said that since he will be gone they will not be drawing his weekly labs.  Told Patty to just draw him when he gets back

## 2014-11-13 ENCOUNTER — Other Ambulatory Visit (HOSPITAL_COMMUNITY): Payer: Self-pay | Admitting: Internal Medicine

## 2014-11-19 DIAGNOSIS — R0602 Shortness of breath: Secondary | ICD-10-CM | POA: Diagnosis not present

## 2014-11-19 DIAGNOSIS — I509 Heart failure, unspecified: Secondary | ICD-10-CM | POA: Diagnosis not present

## 2014-11-19 DIAGNOSIS — I5022 Chronic systolic (congestive) heart failure: Secondary | ICD-10-CM | POA: Diagnosis not present

## 2014-11-20 ENCOUNTER — Telehealth (HOSPITAL_COMMUNITY): Payer: Self-pay

## 2014-11-20 DIAGNOSIS — Z452 Encounter for adjustment and management of vascular access device: Secondary | ICD-10-CM | POA: Diagnosis not present

## 2014-11-20 DIAGNOSIS — I129 Hypertensive chronic kidney disease with stage 1 through stage 4 chronic kidney disease, or unspecified chronic kidney disease: Secondary | ICD-10-CM | POA: Diagnosis not present

## 2014-11-20 DIAGNOSIS — Z950 Presence of cardiac pacemaker: Secondary | ICD-10-CM | POA: Diagnosis not present

## 2014-11-20 DIAGNOSIS — Z9981 Dependence on supplemental oxygen: Secondary | ICD-10-CM | POA: Diagnosis not present

## 2014-11-20 DIAGNOSIS — I480 Paroxysmal atrial fibrillation: Secondary | ICD-10-CM | POA: Diagnosis not present

## 2014-11-20 DIAGNOSIS — Z7901 Long term (current) use of anticoagulants: Secondary | ICD-10-CM | POA: Diagnosis not present

## 2014-11-20 DIAGNOSIS — N184 Chronic kidney disease, stage 4 (severe): Secondary | ICD-10-CM | POA: Diagnosis not present

## 2014-11-20 DIAGNOSIS — Z79899 Other long term (current) drug therapy: Secondary | ICD-10-CM | POA: Diagnosis not present

## 2014-11-20 DIAGNOSIS — I5022 Chronic systolic (congestive) heart failure: Secondary | ICD-10-CM | POA: Diagnosis not present

## 2014-11-20 DIAGNOSIS — Z5181 Encounter for therapeutic drug level monitoring: Secondary | ICD-10-CM | POA: Diagnosis not present

## 2014-11-20 NOTE — Telephone Encounter (Signed)
Patient has just returned from being out of town for 2 weeks [place] nurse called with report. Said it sounds like he had a lot of dietary indiscretions while gone Weight was up to 232  Took metolazone  Weight today is 221  Abdominal Girth down 3.5 cm Ankle circumference down 1.5 cm bilaterally  BMET being done today

## 2014-11-21 IMAGING — CR DG CHEST 1V PORT
1 series · 1 of 1 positions shown · non-contrast
Comparison: 11/25/2012

CLINICAL DATA: Right-sided chest pain and shortness of breath.

EXAM:
PORTABLE CHEST - 1 VIEW

[AP]
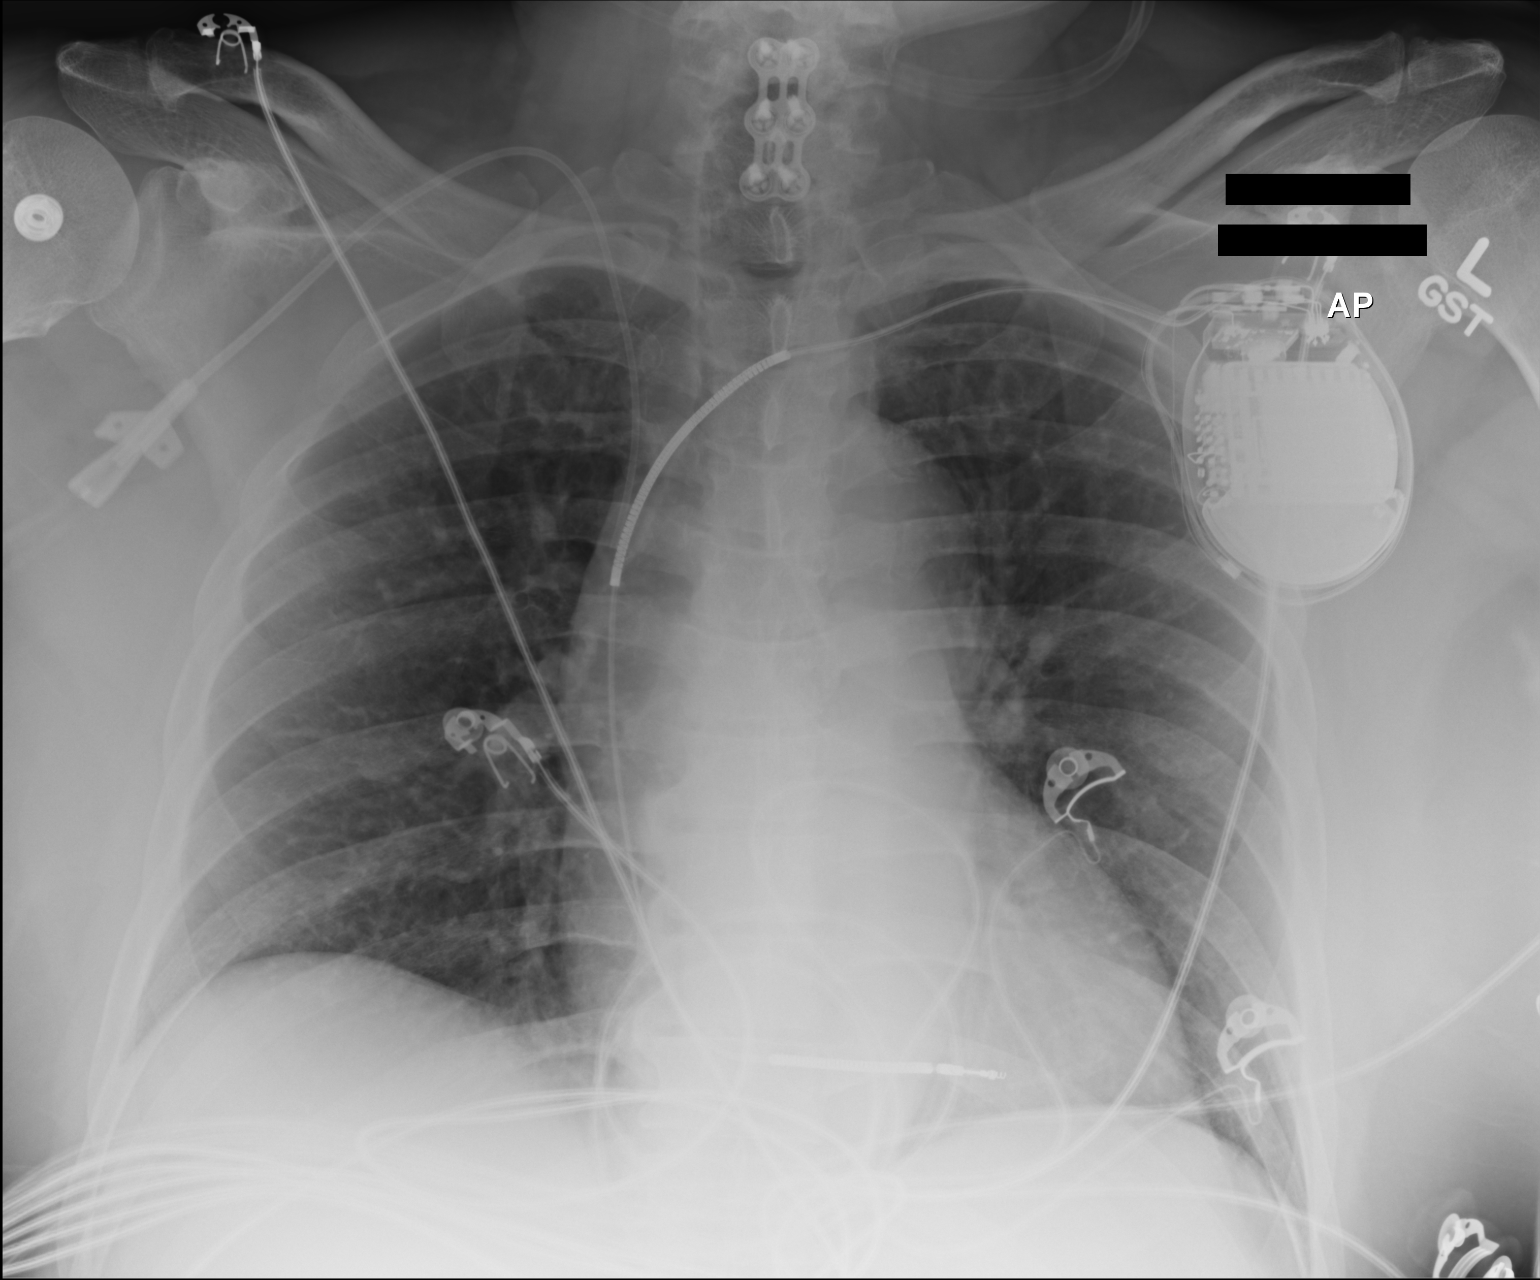

[1 of 1 positions shown; findings below may reference images not displayed]

FINDINGS: Unchanged appearance of cardiac pacemaker, right central venous
catheter, and postoperative changes in the cervical spine.
Borderline heart size with normal pulmonary vascularity. Lungs
appear grossly clear and expanded. No significant change since prior
study.
IMPRESSION: No active disease.

## 2014-11-23 ENCOUNTER — Emergency Department (HOSPITAL_COMMUNITY): Payer: Medicare Other

## 2014-11-23 ENCOUNTER — Encounter (HOSPITAL_COMMUNITY): Payer: Self-pay

## 2014-11-23 ENCOUNTER — Emergency Department (HOSPITAL_COMMUNITY)
Admission: EM | Admit: 2014-11-23 | Discharge: 2014-11-23 | Disposition: A | Discharge status: Home / Self Care | Payer: Medicare Other | Attending: Emergency Medicine | Admitting: Emergency Medicine

## 2014-11-23 DIAGNOSIS — Z9889 Other specified postprocedural states: Secondary | ICD-10-CM | POA: Insufficient documentation

## 2014-11-23 DIAGNOSIS — I2510 Atherosclerotic heart disease of native coronary artery without angina pectoris: Secondary | ICD-10-CM | POA: Insufficient documentation

## 2014-11-23 DIAGNOSIS — I252 Old myocardial infarction: Secondary | ICD-10-CM | POA: Insufficient documentation

## 2014-11-23 DIAGNOSIS — Z95810 Presence of automatic (implantable) cardiac defibrillator: Secondary | ICD-10-CM | POA: Insufficient documentation

## 2014-11-23 DIAGNOSIS — S9032XA Contusion of left foot, initial encounter: Secondary | ICD-10-CM | POA: Diagnosis not present

## 2014-11-23 DIAGNOSIS — Y998 Other external cause status: Secondary | ICD-10-CM | POA: Insufficient documentation

## 2014-11-23 DIAGNOSIS — Z8669 Personal history of other diseases of the nervous system and sense organs: Secondary | ICD-10-CM | POA: Insufficient documentation

## 2014-11-23 DIAGNOSIS — Y9222 Religious institution as the place of occurrence of the external cause: Secondary | ICD-10-CM | POA: Insufficient documentation

## 2014-11-23 DIAGNOSIS — M109 Gout, unspecified: Secondary | ICD-10-CM | POA: Insufficient documentation

## 2014-11-23 DIAGNOSIS — Z862 Personal history of diseases of the blood and blood-forming organs and certain disorders involving the immune mechanism: Secondary | ICD-10-CM | POA: Insufficient documentation

## 2014-11-23 DIAGNOSIS — N183 Chronic kidney disease, stage 3 (moderate): Secondary | ICD-10-CM | POA: Diagnosis not present

## 2014-11-23 DIAGNOSIS — Y9389 Activity, other specified: Secondary | ICD-10-CM | POA: Diagnosis not present

## 2014-11-23 DIAGNOSIS — I129 Hypertensive chronic kidney disease with stage 1 through stage 4 chronic kidney disease, or unspecified chronic kidney disease: Secondary | ICD-10-CM | POA: Diagnosis not present

## 2014-11-23 DIAGNOSIS — K219 Gastro-esophageal reflux disease without esophagitis: Secondary | ICD-10-CM | POA: Insufficient documentation

## 2014-11-23 DIAGNOSIS — Z7902 Long term (current) use of antithrombotics/antiplatelets: Secondary | ICD-10-CM | POA: Diagnosis not present

## 2014-11-23 DIAGNOSIS — S99922A Unspecified injury of left foot, initial encounter: Secondary | ICD-10-CM | POA: Diagnosis present

## 2014-11-23 DIAGNOSIS — I5023 Acute on chronic systolic (congestive) heart failure: Secondary | ICD-10-CM | POA: Diagnosis not present

## 2014-11-23 DIAGNOSIS — Z7951 Long term (current) use of inhaled steroids: Secondary | ICD-10-CM | POA: Diagnosis not present

## 2014-11-23 DIAGNOSIS — W010XXA Fall on same level from slipping, tripping and stumbling without subsequent striking against object, initial encounter: Secondary | ICD-10-CM | POA: Diagnosis not present

## 2014-11-23 DIAGNOSIS — E7800 Pure hypercholesterolemia, unspecified: Secondary | ICD-10-CM | POA: Insufficient documentation

## 2014-11-23 DIAGNOSIS — M79672 Pain in left foot: Secondary | ICD-10-CM

## 2014-11-23 DIAGNOSIS — Z79899 Other long term (current) drug therapy: Secondary | ICD-10-CM | POA: Diagnosis not present

## 2014-11-23 MED ORDER — OXYCODONE HCL 5 MG PO TABS: 5.0000 mg | ORAL_TABLET | ORAL | Status: DC | PRN

## 2014-11-23 MED ORDER — OXYCODONE HCL 5 MG PO TABS
10.0000 mg | ORAL_TABLET | Freq: Once | ORAL | Status: AC
  Administered 2014-11-23: 10 mg via ORAL
  Filled 2014-11-23: qty 2

## 2014-11-23 NOTE — Discharge Instructions (Signed)
1. Medications: oxycodone for severe pain, usual home medications 2. Treatment: rest, ice, elevate and use brace, drink plenty of fluids, gentle stretching 3. Follow Up: Please followup with orthopedics as directed or your PCP in 3 days if no improvement for discussion of your diagnoses and further evaluation after today's visit; if you do not have a primary care doctor use the resource guide provided to find one; Please return to the ER for worsening symptoms or other concerns    Cryotherapy Cryotherapy means treatment with cold. Ice or gel packs can be used to reduce both pain and swelling. Ice is the most helpful within the first 24 to 48 hours after an injury or flare-up from overusing a muscle or joint. Sprains, strains, spasms, burning pain, shooting pain, and aches can all be eased with ice. Ice can also be used when recovering from surgery. Ice is effective, has very few side effects, and is safe for most people to use. PRECAUTIONS  Ice is not a safe treatment option for people with:  Raynaud phenomenon. This is a condition affecting small blood vessels in the extremities. Exposure to cold may cause your problems to return.  Cold hypersensitivity. There are many forms of cold hypersensitivity, including:  Cold urticaria. Red, itchy hives appear on the skin when the tissues begin to warm after being iced.  Cold erythema. This is a red, itchy rash caused by exposure to cold.  Cold hemoglobinuria. Red blood cells break down when the tissues begin to warm after being iced. The hemoglobin that carry oxygen are passed into the urine because they cannot combine with blood proteins fast enough.  Numbness or altered sensitivity in the area being iced. If you have any of the following conditions, do not use ice until you have discussed cryotherapy with your caregiver:  Heart conditions, such as arrhythmia, angina, or chronic heart disease.  High blood pressure.  Healing wounds or open skin  in the area being iced.  Current infections.  Rheumatoid arthritis.  Poor circulation.  Diabetes. Ice slows the blood flow in the region it is applied. This is beneficial when trying to stop inflamed tissues from spreading irritating chemicals to surrounding tissues. However, if you expose your skin to cold temperatures for too long or without the proper protection, you can damage your skin or nerves. Watch for signs of skin damage due to cold. HOME CARE INSTRUCTIONS Follow these tips to use ice and cold packs safely.  Place a dry or damp towel between the ice and skin. A damp towel will cool the skin more quickly, so you may need to shorten the time that the ice is used.  For a more rapid response, add gentle compression to the ice.  Ice for no more than 10 to 20 minutes at a time. The bonier the area you are icing, the less time it will take to get the benefits of ice.  Check your skin after 5 minutes to make sure there are no signs of a poor response to cold or skin damage.  Rest 20 minutes or more between uses.  Once your skin is numb, you can end your treatment. You can test numbness by very lightly touching your skin. The touch should be so light that you do not see the skin dimple from the pressure of your fingertip. When using ice, most people will feel these normal sensations in this order: cold, burning, aching, and numbness.  Do not use ice on someone who cannot communicate their  responses to pain, such as small children or people with dementia. HOW TO MAKE AN ICE PACK Ice packs are the most common way to use ice therapy. Other methods include ice massage, ice baths, and cryosprays. Muscle creams that cause a cold, tingly feeling do not offer the same benefits that ice offers and should not be used as a substitute unless recommended by your caregiver. To make an ice pack, do one of the following:  Place crushed ice or a bag of frozen vegetables in a sealable plastic bag.  Squeeze out the excess air. Place this bag inside another plastic bag. Slide the bag into a pillowcase or place a damp towel between your skin and the bag.  Mix 3 parts water with 1 part rubbing alcohol. Freeze the mixture in a sealable plastic bag. When you remove the mixture from the freezer, it will be slushy. Squeeze out the excess air. Place this bag inside another plastic bag. Slide the bag into a pillowcase or place a damp towel between your skin and the bag. SEEK MEDICAL CARE IF:  You develop white spots on your skin. This may give the skin a blotchy (mottled) appearance.  Your skin turns blue or pale.  Your skin becomes waxy or hard.  Your swelling gets worse. MAKE SURE YOU:   Understand these instructions.  Will watch your condition.  Will get help right away if you are not doing well or get worse.   This information is not intended to replace advice given to you by your health care provider. Make sure you discuss any questions you have with your health care provider.   Document Released: 08/15/2010 Document Revised: 01/09/2014 Document Reviewed: 08/15/2010 Elsevier Interactive Patient Education [employer].

## 2014-11-23 NOTE — ED Provider Notes (Signed)
CSN: [id number]     Arrival date & time 11/23/14  1731 History  By signing my name below, I, Julien Nordmann, attest that this documentation has been prepared under the direction and in the presence of [employer], PA-C. Electronically Signed: Julien Nordmann, ED Scribe. 11/23/2014. 8:35 PM.      Chief Complaint  Patient presents with  . Fall  . Foot Pain      The history is provided by the patient. No language interpreter was used.   HPI Comments: [name] is a 64 y.o. male who has a hx of CHF (on milranone drip), Sarcoidosis, Stage IV Liver disease, and HTN presents to the Emergency Department complaining of constant, moderate, gradual worsening left foot pain onset last night. Pt reports having sharp shooting pains in his left foot this morning which he describes as burning. He states he slipped and fell at church last night injuring his foot. He states he is unable to ambulate on that foot due to increased pain. Pt has not taken any medication to alleviate the pain. Pt did not hit his head or lose consciousness.  He denies hip pain, neck pain and back pain.  Pt is anticoagulated on Eliquis.  Pt is very clear that he tripped and fell.  He denies syncope, LOC, confusion, vision changes, numbness, weakness.    Past Medical History  Diagnosis Date  . CHF (congestive heart failure) ([place])   . Sarcoidosis ([place])   . Cardiomyopathy, dilated, nonischemic ([place])     non ischemic by cath  . Acute on chronic systolic heart failure ([place])   . Automatic implantable cardiac defibrillator in situ   . Atrial fibrillation ([place])   . NSVT (nonsustained ventricular tachycardia) ([place])   . GERD (gastroesophageal reflux disease)   . Hypercholesteremia   . Shortness of breath   . Chronic kidney disease (CKD), stage III (moderate)   . Pacemaker   . Anginal pain ([place])   . Gout   . Hypertension     dr [name]  . Coronary artery disease   . Elevated PSA 06/24/2013  . Myocardial infarction  ([place])   . Headache   . Neuropathy [facility])    Past Surgical History  Procedure Laterality Date  . Pacemaker insertion  2009    with ICD  . Tee without cardioversion  01/17/2011    Procedure: TRANSESOPHAGEAL ECHOCARDIOGRAM (TEE);  Surgeon: Birdie Riddle, MD;  Location: [place];  Service: Cardiovascular;  Laterality: N/A;  . Cardioversion  01/17/2011    Procedure: CARDIOVERSION;  Surgeon: Birdie Riddle, MD;  Location: [facility] [facility];  Service: Cardiovascular;  Laterality: N/A;  . Cardiac catheterization    . Insert / replace / remove pacemaker    . Anterior cervical decomp/discectomy fusion  08/21/2011    Procedure: ANTERIOR CERVICAL DECOMPRESSION/DISCECTOMY FUSION 2 LEVELS;  Surgeon: Marybelle Killings, MD;  Location: [facility];  Service: Orthopedics;  Laterality: N/A;  C5-6, C6-7 Anterior Cervical Discectomy and Fusion, allograft, plate  . Tee without cardioversion N/A 02/16/2012    Procedure: TRANSESOPHAGEAL ECHOCARDIOGRAM (TEE);  Surgeon: Jolaine Artist, MD;  Location: [facility] ENDOSCOPY;  Service: Cardiovascular;  Laterality: N/A;  original case scheduled under his dad (who is deceased), rescheduled under correct mrn/pt/dob. [place]/dl  . Tee without cardioversion N/A 03/22/2012    Procedure: TRANSESOPHAGEAL ECHOCARDIOGRAM (TEE);  Surgeon: Lelon Perla, MD;  Location: [facility] ENDOSCOPY;  Service: Cardiovascular;  Laterality: N/A;  . Cardioversion N/A 03/22/2012    Procedure: CARDIOVERSION;  Surgeon: Denice Bors  Stanford Breed, MD;  Location: [place];  Service: Cardiovascular;  Laterality: N/A;  . Cardioversion N/A 04/26/2012    Procedure: CARDIOVERSION;  Surgeon: Jolaine Artist, MD;  Location: [facility] ENDOSCOPY;  Service: Cardiovascular;  Laterality: N/A;  . Central venous catheter tunneled insertion single lumen  09/16/2012    right IJ  . Cardioversion N/A 11/08/2012    Procedure: CARDIOVERSION;  Surgeon: Jolaine Artist, MD;  Location: [facility] ENDOSCOPY;  Service: Cardiovascular;  Laterality: N/A;  . Back surgery   1987, lower    Ruptured disk repair  . Peripherally inserted central catheter insertion  2013  . Right heart catheterization N/A 01/26/2012    Procedure: RIGHT HEART CATH;  Surgeon: Jolaine Artist, MD;  Location: [facility] CATH LAB;  Service: Cardiovascular;  Laterality: N/A;  . Right heart catheterization N/A 04/30/2013    Procedure: RIGHT HEART CATH;  Surgeon: Jolaine Artist, MD;  Location: [facility] CATH LAB;  Service: Cardiovascular;  Laterality: N/A;   Family History  Problem Relation Age of Onset  . Heart disease    . Heart failure    . Stroke    . Anesthesia problems Neg Hx   . Hypotension Neg Hx   . Malignant hyperthermia Neg Hx   . Pseudochol deficiency Neg Hx   . Heart failure Sister   . Heart attack Sister   . Hyperlipidemia Mother    Social History  Substance Use Topics  . Smoking status: Never Smoker   . Smokeless tobacco: Never Used  . Alcohol Use: No    Review of Systems  Constitutional: Negative for fever and chills.  Gastrointestinal: Negative for nausea and vomiting.  Musculoskeletal: Positive for joint swelling and arthralgias. Negative for back pain, neck pain and neck stiffness.  Skin: Negative for wound.  Neurological: Negative for numbness.  Hematological: Does not bruise/bleed easily.  Psychiatric/Behavioral: The patient is not nervous/anxious.   All other systems reviewed and are negative.     Allergies  Nitroglycerin and Colchicine  Home Medications   Prior to Admission medications   Medication Sig Start Date End Date Taking? Authorizing Provider  allopurinol (ZYLOPRIM) 100 MG tablet Take 1 tablet (100 mg total) by mouth daily. 02/05/12   Wynetta Emery, PA-C  ALPRAZolam Duanne Moron) 0.5 MG tablet Take 1 tablet (0.5 mg total) by mouth 3 (three) times daily as needed for anxiety. 10/28/14   Debbrah Alar, NP  apixaban (ELIQUIS) 5 MG TABS tablet Take 1 tablet (5 mg total) by mouth 2 (two) times daily. 09/24/14   Amy D Ninfa Meeker, NP  atorvastatin (LIPITOR)  80 MG tablet Take 1 tablet (80 mg total) by mouth daily. 10/17/14   Debbrah Alar, NP  carvedilol (COREG) 6.25 MG tablet TAKE 1 TABLET (6.25 MG TOTAL) BY MOUTH 2 (TWO) TIMES DAILY WITH A MEAL. 08/20/14   Larey Dresser, MD  diphenhydrAMINE (BENADRYL) 50 MG capsule Take 50 mg by mouth daily as needed for itching.    Historical Provider, MD  eplerenone (INSPRA) 25 MG tablet Take 12.5 mg by mouth every other day.  09/11/13   Rande Brunt, NP  fluticasone (FLONASE) 50 MCG/ACT nasal spray Place 2 sprays into both nostrils daily as needed for allergies. 10/17/13   Debbrah Alar, NP  HORIZANT 600 MG TBCR Take 600 mg by mouth at bedtime. 07/08/14   Historical Provider, MD  magnesium oxide (MAG-OX) 400 MG tablet Take 1 tablet (400 mg total) by mouth 3 (three) times daily. 09/25/14   Amy Estrella Deeds, NP  metolazone (  ZAROXOLYN) 2.5 MG tablet Take 1 tablet (2.5 mg total) by mouth daily as needed (for fluid retention). 08/27/13   Jolaine Artist, MD  milrinone [facility]) 20 MG/100ML SOLN infusion Inject 0.375 mcg/kg/min into the vein continuous. 440mg  in 423ml infusion 2.4 ml per hour    Historical Provider, MD  oxyCODONE (OXY IR/ROXICODONE) 5 MG immediate release tablet Take 1 tablet (5 mg total) by mouth every 4 (four) hours as needed for severe pain. 11/23/14   Vivek Grealish, PA-C  potassium chloride SA (K-DUR,KLOR-CON) 20 MEQ tablet Take 2 tablets (40 mEq total) by mouth 2 (two) times daily. 09/25/14   Amy D Ninfa Meeker, NP  ranitidine (ZANTAC) 150 MG capsule Take 150 mg by mouth daily as needed for heartburn (reflux).    Historical Provider, MD  senna-docusate (SENOKOT-S) 8.6-50 MG per tablet Take 1-2 tablets by mouth 2 (two) times daily as needed for mild constipation or moderate constipation. 10/21/13   Debbrah Alar, NP  sertraline (ZOLOFT) 100 MG tablet Take 1 tablet (100 mg total) by mouth daily. 05/13/14   Debbrah Alar, NP  sucralfate (CARAFATE) 1 GM/10ML suspension Take 1 g by mouth 4  (four) times daily as needed (indigestion).    Historical Provider, MD  sulfamethoxazole-trimethoprim (BACTRIM) 400-80 MG tablet Take 1 tablet by mouth 2 (two) times daily. 09/30/14   Debbrah Alar, NP  torsemide (DEMADEX) 20 MG tablet Take 2 tablets (40 mg total) by mouth 2 (two) times daily. 09/11/14   Larey Dresser, MD  zolpidem (AMBIEN) 5 MG tablet TAKE 1 AND 1/2 TABLETS BY MOUTH EVERY DAY AT BEDTIME 07/31/14   Debbrah Alar, NP   Triage vitals: BP 101/82 mmHg  Pulse 99  Temp(Src) 98.2 F (36.8 C) (Oral)  Resp 16  SpO2 95% Physical Exam  Constitutional: He appears well-developed and well-nourished. No distress.  HENT:  Head: Normocephalic and atraumatic.  Eyes: Conjunctivae are normal.  Neck: Normal range of motion.  Cardiovascular: Normal rate, regular rhythm and intact distal pulses.   Capillary refill < 3 sec  Pulmonary/Chest: Effort normal and breath sounds normal.  Musculoskeletal: He exhibits tenderness. He exhibits no edema.  ROM: full ROM of left hip and knee, slightly limited ROM of left ankle due to pain and decreased ROM of all toes due to pain. Mild swelling and ecchymosis noted to lateral portion of left foot, no lacerations or open wounds to left foot or ankle and negative Thompson's test. No midline or perispinal tenderness to t-spine, c-spine, and l-spine.  Neurological: He is alert. Coordination normal.  Sensation intact throughout the LLE Strength 4-5 in left ankle and left great toe due to pain, 5-5 at the left knee and hip.  Skin: Skin is warm and dry. He is not diaphoretic.  No tenting of the skin  Psychiatric: He has a normal mood and affect.  Nursing note and vitals reviewed.   ED Course  Procedures  DIAGNOSTIC STUDIES: Oxygen Saturation is 95% on RA, low by my interpretation.  COORDINATION OF CARE:  8:31 PM Discussed treatment plan which includes pain medication and left foot brace with pt at bedside and pt agreed to plan.  Labs  Review Labs Reviewed - No data to display  Imaging Review Dg Foot Complete Left  11/23/2014  CLINICAL DATA:  Status post fall the night of 11/22/2014. Diffuse left foot pain. Initial encounter. EXAM: LEFT FOOT - COMPLETE 3+ VIEW COMPARISON:  Plain films left foot 01/25/2013. FINDINGS: No acute bony or joint abnormality is identified. First MTP  degenerative change is noted. Soft tissues are unremarkable. IMPRESSION: No acute abnormality. Electronically Signed   By: Inge Rise M.D.   On: 11/23/2014 18:21   I have personally reviewed and evaluated these images and lab results as part of my medical decision-making.   EKG Interpretation None      MDM   Final diagnoses:  Left foot pain  Fall from slip, trip, or stumble, initial encounter   [name] presents with left foot pain.  Patient X-Ray negative for obvious fracture or dislocation. Pain managed in ED. Pt advised to follow up with orthopedics if symptoms persist for possibility of missed fracture diagnosis. Patient given brace while in ED, conservative therapy recommended and discussed. No head trauma noted and pt with normal gross neurologic exam.  No neck and back pain reported or on exam.  Concern for pt safety with crutches.  Ability to ambualte improved with brace and pain control, though pt continues to report significant pain.  Pt also has his mother's walker at home he can use.  He wishes for d/c home and no further pain control in the ED.  Patient will be dc home & is agreeable with above plan.  BP 129/100 mmHg  Pulse 95  Temp(Src) 98.2 F (36.8 C) (Temporal)  Resp 18  SpO2 98%  I personally performed the services described in this documentation, which was scribed in my presence. The recorded information has been reviewed and is accurate.   Jarrett Soho Ryanne Morand, PA-C 11/23/14 2125  Davonna Belling, MD 11/24/14 [phone]

## 2014-11-23 NOTE — ED Notes (Signed)
Pt c/o L foot pain after a fall last night.  Pain score 10/10.  Pt reports taking ibuprofen w/o relief.

## 2014-11-24 ENCOUNTER — Telehealth: Payer: Self-pay | Admitting: Family

## 2014-11-24 NOTE — Telephone Encounter (Signed)
Scheduled 11/25/14 8:15am

## 2014-11-24 NOTE — Telephone Encounter (Signed)
Please contact pt to arrange ED follow up with me 11/23.

## 2014-11-25 ENCOUNTER — Encounter: Payer: Self-pay | Admitting: Family

## 2014-11-25 ENCOUNTER — Ambulatory Visit (INDEPENDENT_AMBULATORY_CARE_PROVIDER_SITE_OTHER): Payer: Medicare Other | Admitting: Family

## 2014-11-25 VITALS — BP 118/86 | HR 80 | Temp 98.0°F | Resp 16 | Ht 75.0 in | Wt 231.4 lb

## 2014-11-25 DIAGNOSIS — I5022 Chronic systolic (congestive) heart failure: Secondary | ICD-10-CM | POA: Diagnosis not present

## 2014-11-25 DIAGNOSIS — G894 Chronic pain syndrome: Secondary | ICD-10-CM

## 2014-11-25 MED ORDER — GABAPENTIN 300 MG PO CAPS: 300.0000 mg | ORAL_CAPSULE | Freq: Three times a day (TID) | ORAL | Status: DC

## 2014-11-25 NOTE — Progress Notes (Signed)
Subjective:    Patient ID: [name], male    DOB: January 09, 1950, 64 y.o.   MRN: [id number]  HPI  [name] is a 64 yr old male who presents today for ED follow up. ED record is reviewed.  Pt was seen on 11/23/14 with chief complaint of left foot pain.  He had apparently slipped and fallen the night before at church and injured his left foot.  He had X ray performed of the left foot.  X ray is reviewed and this was negative for acute abnormality.  Pt reports continued pain the left foot. Has pain which radiates from the left lateral leg down into the foot.  Brace helps some.    Review of Systems    see HPI  Past Medical History  Diagnosis Date  . CHF (congestive heart failure) ([place])   . Sarcoidosis ([place])   . Cardiomyopathy, dilated, nonischemic ([place])     non ischemic by cath  . Acute on chronic systolic heart failure ([place])   . Automatic implantable cardiac defibrillator in situ   . Atrial fibrillation ([place])   . NSVT (nonsustained ventricular tachycardia) ([place])   . GERD (gastroesophageal reflux disease)   . Hypercholesteremia   . Shortness of breath   . Chronic kidney disease (CKD), stage III (moderate)   . Pacemaker   . Anginal pain ([place])   . Gout   . Hypertension     dr [name]  . Coronary artery disease   . Elevated PSA 06/24/2013  . Myocardial infarction ([place])   . Headache   . Neuropathy [facility])     Social History   Social History  . Marital Status: Single    Spouse Name: N/A  . Number of Children: N/A  . Years of Education: N/A   Occupational History  . Not on file.   Social History Main Topics  . Smoking status: Never Smoker   . Smokeless tobacco: Never Used  . Alcohol Use: No  . Drug Use: No     Comment: last use august 2004  . Sexual Activity: Not Currently   Other Topics Concern  . Not on file   Social History Narrative   3 sons- one in [place], 1 in [place], on in [place]   Retired from [employer] office- on disability   Completed Bachelors from [employer]     Past Surgical History  Procedure Laterality Date  . Pacemaker insertion  2009    with ICD  . Tee without cardioversion  01/17/2011    Procedure: TRANSESOPHAGEAL ECHOCARDIOGRAM (TEE);  Surgeon: Birdie Riddle, MD;  Location: [place];  Service: Cardiovascular;  Laterality: N/A;  . Cardioversion  01/17/2011    Procedure: CARDIOVERSION;  Surgeon: Birdie Riddle, MD;  Location: [place];  Service: Cardiovascular;  Laterality: N/A;  . Cardiac catheterization    . Insert / replace / remove pacemaker    . Anterior cervical decomp/discectomy fusion  08/21/2011    Procedure: ANTERIOR CERVICAL DECOMPRESSION/DISCECTOMY FUSION 2 LEVELS;  Surgeon: Marybelle Killings, MD;  Location: [place];  Service: Orthopedics;  Laterality: N/A;  C5-6, C6-7 Anterior Cervical Discectomy and Fusion, allograft, plate  . Tee without cardioversion N/A 02/16/2012    Procedure: TRANSESOPHAGEAL ECHOCARDIOGRAM (TEE);  Surgeon: Jolaine Artist, MD;  Location: [facility] ENDOSCOPY;  Service: Cardiovascular;  Laterality: N/A;  original case scheduled under his dad (who is deceased), rescheduled under correct mrn/pt/dob. [place]/dl  . Tee without cardioversion N/A 03/22/2012    Procedure: TRANSESOPHAGEAL ECHOCARDIOGRAM (  TEE);  Surgeon: Lelon Perla, MD;  Location: [facility] ENDOSCOPY;  Service: Cardiovascular;  Laterality: N/A;  . Cardioversion N/A 03/22/2012    Procedure: CARDIOVERSION;  Surgeon: Lelon Perla, MD;  Location: [facility] ENDOSCOPY;  Service: Cardiovascular;  Laterality: N/A;  . Cardioversion N/A 04/26/2012    Procedure: CARDIOVERSION;  Surgeon: Jolaine Artist, MD;  Location: [facility] ENDOSCOPY;  Service: Cardiovascular;  Laterality: N/A;  . Central venous catheter tunneled insertion single lumen  09/16/2012    right IJ  . Cardioversion N/A 11/08/2012    Procedure: CARDIOVERSION;  Surgeon: Jolaine Artist, MD;  Location: [facility] ENDOSCOPY;  Service: Cardiovascular;  Laterality: N/A;  . Back surgery  1987, lower    Ruptured disk repair  .  Peripherally inserted central catheter insertion  2013  . Right heart catheterization N/A 01/26/2012    Procedure: RIGHT HEART CATH;  Surgeon: Jolaine Artist, MD;  Location: [facility] CATH LAB;  Service: Cardiovascular;  Laterality: N/A;  . Right heart catheterization N/A 04/30/2013    Procedure: RIGHT HEART CATH;  Surgeon: Jolaine Artist, MD;  Location: [facility] CATH LAB;  Service: Cardiovascular;  Laterality: N/A;    Family History  Problem Relation Age of Onset  . Heart disease    . Heart failure    . Stroke    . Anesthesia problems Neg Hx   . Hypotension Neg Hx   . Malignant hyperthermia Neg Hx   . Pseudochol deficiency Neg Hx   . Heart failure Sister   . Heart attack Sister   . Hyperlipidemia Mother     Allergies  Allergen Reactions  . Nitroglycerin Other (See Comments)    "feels like head is going to bust open"  . Colchicine Nausea Only    Current Outpatient Prescriptions on File Prior to Visit  Medication Sig Dispense Refill  . allopurinol (ZYLOPRIM) 100 MG tablet Take 1 tablet (100 mg total) by mouth daily. 30 tablet 6  . ALPRAZolam (XANAX) 0.5 MG tablet Take 1 tablet (0.5 mg total) by mouth 3 (three) times daily as needed for anxiety. 60 tablet 0  . apixaban (ELIQUIS) 5 MG TABS tablet Take 1 tablet (5 mg total) by mouth 2 (two) times daily. 60 tablet 3  . atorvastatin (LIPITOR) 80 MG tablet Take 1 tablet (80 mg total) by mouth daily. 30 tablet 3  . carvedilol (COREG) 6.25 MG tablet TAKE 1 TABLET (6.25 MG TOTAL) BY MOUTH 2 (TWO) TIMES DAILY WITH A MEAL. 60 tablet 3  . diphenhydrAMINE (BENADRYL) 50 MG capsule Take 50 mg by mouth daily as needed for itching.    Marland Kitchen eplerenone (INSPRA) 25 MG tablet Take 12.5 mg by mouth every other day.     . fluticasone (FLONASE) 50 MCG/ACT nasal spray Place 2 sprays into both nostrils daily as needed for allergies. 16 g 2  . HORIZANT 600 MG TBCR Take 600 mg by mouth at bedtime.    . magnesium oxide (MAG-OX) 400 MG tablet Take 1 tablet (400 mg  total) by mouth 3 (three) times daily. 90 tablet 3  . metolazone (ZAROXOLYN) 2.5 MG tablet Take 1 tablet (2.5 mg total) by mouth daily as needed (for fluid retention). 10 tablet 6  . milrinone (PRIMACOR) 20 MG/100ML SOLN infusion Inject 0.375 mcg/kg/min into the vein continuous. 440mg  in 429ml infusion 2.4 ml per hour    . oxyCODONE (OXY IR/ROXICODONE) 5 MG immediate release tablet Take 1 tablet (5 mg total) by mouth every 4 (four) hours as needed for severe pain. 15  tablet 0  . potassium chloride SA (K-DUR,KLOR-CON) 20 MEQ tablet Take 2 tablets (40 mEq total) by mouth 2 (two) times daily. 120 tablet 6  . ranitidine (ZANTAC) 150 MG capsule Take 150 mg by mouth daily as needed for heartburn (reflux).    Marland Kitchen senna-docusate (SENOKOT-S) 8.6-50 MG per tablet Take 1-2 tablets by mouth 2 (two) times daily as needed for mild constipation or moderate constipation. 60 tablet 3  . sertraline (ZOLOFT) 100 MG tablet Take 1 tablet (100 mg total) by mouth daily. 90 tablet 1  . sucralfate (CARAFATE) 1 GM/10ML suspension Take 1 g by mouth 4 (four) times daily as needed (indigestion).    Marland Kitchen sulfamethoxazole-trimethoprim (BACTRIM) 400-80 MG tablet Take 1 tablet by mouth 2 (two) times daily. 42 tablet 0  . torsemide (DEMADEX) 20 MG tablet Take 2 tablets (40 mg total) by mouth 2 (two) times daily. 120 tablet 6  . zolpidem (AMBIEN) 5 MG tablet TAKE 1 AND 1/2 TABLETS BY MOUTH EVERY DAY AT BEDTIME 45 tablet 0   No current facility-administered medications on file prior to visit.    BP 118/86 mmHg  Pulse 80  Temp(Src) 98 F (36.7 C) (Oral)  Resp 16  Ht 6\' 3"  (1.905 m)  Wt 231 lb 6.4 oz (104.962 kg)  BMI 28.92 kg/m2  SpO2 98%    Objective:   Physical Exam  Constitutional: He is oriented to person, place, and time. He appears well-developed and well-nourished. No distress.  HENT:  Head: Normocephalic and atraumatic.  Cardiovascular: Normal rate.  An irregular rhythm present.  Murmur heard.  Systolic murmur is  present with a grade of 2/6  Midsystolic murmur  Pulmonary/Chest: Effort normal and breath sounds normal. No respiratory distress. He has no wheezes. He has no rales.  Musculoskeletal: He exhibits no edema.  Slight swelling of left foot + tenderness to palpation overlying the let dorsal foot  Neurological: He is alert and oriented to person, place, and time.  Skin: Skin is warm and dry.  Psychiatric: He has a normal mood and affect. His behavior is normal. Thought content normal.          Assessment & Plan:  Foot pain-new.  trial of gabapentin.  Continue brace. Pt requests referral to a different pain clinic. Will place referral.

## 2014-11-25 NOTE — Patient Instructions (Signed)
Please start gabapentin for left leg/foot pain. We will place a referral to pain clinic for you. Continue your foot/ankle brace until symptoms are improved.

## 2014-11-25 NOTE — Progress Notes (Signed)
Pre visit review using our clinic review tool, if applicable. No additional management support is needed unless otherwise documented below in the visit note. 

## 2014-11-26 DIAGNOSIS — I509 Heart failure, unspecified: Secondary | ICD-10-CM | POA: Diagnosis not present

## 2014-11-26 DIAGNOSIS — R0602 Shortness of breath: Secondary | ICD-10-CM | POA: Diagnosis not present

## 2014-12-03 ENCOUNTER — Encounter (HOSPITAL_COMMUNITY): Payer: Self-pay | Admitting: Emergency Medicine

## 2014-12-03 ENCOUNTER — Telehealth (HOSPITAL_COMMUNITY): Payer: Self-pay

## 2014-12-03 ENCOUNTER — Observation Stay (HOSPITAL_COMMUNITY)
Admission: EM | Admit: 2014-12-03 | Discharge: 2014-12-03 | Disposition: A | Discharge status: Home Health | Payer: Medicare Other | Attending: Emergency Medicine | Admitting: Emergency Medicine

## 2014-12-03 ENCOUNTER — Emergency Department (HOSPITAL_COMMUNITY): Payer: Medicare Other

## 2014-12-03 DIAGNOSIS — K219 Gastro-esophageal reflux disease without esophagitis: Secondary | ICD-10-CM | POA: Insufficient documentation

## 2014-12-03 DIAGNOSIS — E7800 Pure hypercholesterolemia, unspecified: Secondary | ICD-10-CM | POA: Diagnosis not present

## 2014-12-03 DIAGNOSIS — I5022 Chronic systolic (congestive) heart failure: Secondary | ICD-10-CM | POA: Diagnosis present

## 2014-12-03 DIAGNOSIS — Z79899 Other long term (current) drug therapy: Secondary | ICD-10-CM | POA: Diagnosis not present

## 2014-12-03 DIAGNOSIS — Z7901 Long term (current) use of anticoagulants: Secondary | ICD-10-CM | POA: Insufficient documentation

## 2014-12-03 DIAGNOSIS — N183 Chronic kidney disease, stage 3 (moderate): Secondary | ICD-10-CM | POA: Insufficient documentation

## 2014-12-03 DIAGNOSIS — R42 Dizziness and giddiness: Secondary | ICD-10-CM | POA: Diagnosis not present

## 2014-12-03 DIAGNOSIS — Z95810 Presence of automatic (implantable) cardiac defibrillator: Secondary | ICD-10-CM | POA: Insufficient documentation

## 2014-12-03 DIAGNOSIS — G8929 Other chronic pain: Secondary | ICD-10-CM

## 2014-12-03 DIAGNOSIS — R0789 Other chest pain: Principal | ICD-10-CM | POA: Insufficient documentation

## 2014-12-03 DIAGNOSIS — M109 Gout, unspecified: Secondary | ICD-10-CM | POA: Diagnosis not present

## 2014-12-03 DIAGNOSIS — I10 Essential (primary) hypertension: Secondary | ICD-10-CM | POA: Diagnosis present

## 2014-12-03 DIAGNOSIS — R079 Chest pain, unspecified: Secondary | ICD-10-CM | POA: Diagnosis not present

## 2014-12-03 DIAGNOSIS — I130 Hypertensive heart and chronic kidney disease with heart failure and stage 1 through stage 4 chronic kidney disease, or unspecified chronic kidney disease: Secondary | ICD-10-CM | POA: Insufficient documentation

## 2014-12-03 DIAGNOSIS — R072 Precordial pain: Secondary | ICD-10-CM | POA: Diagnosis not present

## 2014-12-03 DIAGNOSIS — I2510 Atherosclerotic heart disease of native coronary artery without angina pectoris: Secondary | ICD-10-CM | POA: Diagnosis not present

## 2014-12-03 DIAGNOSIS — N184 Chronic kidney disease, stage 4 (severe): Secondary | ICD-10-CM | POA: Diagnosis present

## 2014-12-03 DIAGNOSIS — D869 Sarcoidosis, unspecified: Secondary | ICD-10-CM | POA: Diagnosis not present

## 2014-12-03 DIAGNOSIS — I420 Dilated cardiomyopathy: Secondary | ICD-10-CM | POA: Diagnosis not present

## 2014-12-03 DIAGNOSIS — R0602 Shortness of breath: Secondary | ICD-10-CM | POA: Insufficient documentation

## 2014-12-03 DIAGNOSIS — I509 Heart failure, unspecified: Secondary | ICD-10-CM | POA: Diagnosis not present

## 2014-12-03 DIAGNOSIS — I4891 Unspecified atrial fibrillation: Secondary | ICD-10-CM | POA: Diagnosis not present

## 2014-12-03 DIAGNOSIS — R05 Cough: Secondary | ICD-10-CM | POA: Diagnosis not present

## 2014-12-03 DIAGNOSIS — R002 Palpitations: Secondary | ICD-10-CM | POA: Diagnosis not present

## 2014-12-03 DIAGNOSIS — Z8673 Personal history of transient ischemic attack (TIA), and cerebral infarction without residual deficits: Secondary | ICD-10-CM | POA: Insufficient documentation

## 2014-12-03 DIAGNOSIS — I252 Old myocardial infarction: Secondary | ICD-10-CM | POA: Diagnosis not present

## 2014-12-03 LAB — CBC
HCT: 42.4 % (ref 39.0–52.0)
Hemoglobin: 14 g/dL (ref 13.0–17.0)
MCH: 29.3 pg (ref 26.0–34.0)
MCHC: 33 g/dL (ref 30.0–36.0)
MCV: 88.7 fL (ref 78.0–100.0)
PLATELETS: 244 10*3/uL (ref 150–400)
RBC: 4.78 MIL/uL (ref 4.22–5.81)
RDW: 14 % (ref 11.5–15.5)
WBC: 5.8 10*3/uL (ref 4.0–10.5)

## 2014-12-03 LAB — BASIC METABOLIC PANEL
Anion gap: 9 (ref 5–15)
BUN: 28 mg/dL — ABNORMAL HIGH (ref 6–20)
CALCIUM: 9.4 mg/dL (ref 8.9–10.3)
CHLORIDE: 105 mmol/L (ref 101–111)
CO2: 25 mmol/L (ref 22–32)
CREATININE: 1.82 mg/dL — AB (ref 0.61–1.24)
GFR calc Af Amer: 44 mL/min — ABNORMAL LOW (ref >60)
GFR calc non Af Amer: 38 mL/min — ABNORMAL LOW (ref >60)
GLUCOSE: 100 mg/dL — AB (ref 65–99)
Potassium: 3.5 mmol/L (ref 3.5–5.1)
Sodium: 139 mmol/L (ref 135–145)

## 2014-12-03 LAB — I-STAT TROPONIN, ED: TROPONIN I, POC: 0.04 ng/mL (ref 0.00–0.08)

## 2014-12-03 MED ORDER — ACETAMINOPHEN 500 MG PO TABS: 1000.0000 mg | ORAL_TABLET | Freq: Four times a day (QID) | ORAL | Status: DC | PRN

## 2014-12-03 MED ORDER — GI COCKTAIL ~~LOC~~
30.0000 mL | Freq: Once | ORAL | Status: AC
  Administered 2014-12-03: 30 mL via ORAL
  Filled 2014-12-03: qty 30

## 2014-12-03 NOTE — Consult Note (Signed)
Advanced Heart Failure Team Consult Note  Referring Physician: Dr Laneta Simmers Primary Physician: Debbrah Alar NP Primary Cardiologist: Dr Haroldine Laws   Reason for Consultation: Chest Pain / Heart Failure   HPI:   [name] is a 64 y.o. 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 He also has history of ventricular tachycardia s/p St Jude ICD (single lead, no Corevue) by Dr. Lovena Le, CKD stage IV, and paroxysmal atrial arrhythmias,sarcoidosis, CVA 08/2014, and hypertension. Cath in 2008 by Dr. Terrence Dupont showed no CAD  He has been on chronic milrinone 0.375 mcg since January 2014 and followed closely in the HF clinic. Seen at [facility] and not felt to be a transplant candidate due to renal failure and lack of family support.  Today he presents to the ED with chest pain and dyspnea.He was been taking extra diuretics for the last 3 days. Weight has gone down from 230 to 223 pounds. Complaining of chronic chest and neck discomfort. He has been out of pain medications for the last month. Followed at the Pain Clinic and said he violated the contract because he took extra oxycodone.  Says pain is worse off medication. No bleeding problems. CXR ok no acute findings. [facility] following for milrinone.  Todays pertinent labs: K  3.5 Creatinine 1.82 Troponin 0.04 WBC 5.8   Review of Systems: [y] = yes, [ ]  = no   General: Weight gain [ ] ; Weight loss [ ] ; Anorexia [ ] ; Fatigue [Y ]; Fever [ ] ; Chills [ ] ; Weakness [Y ]  Cardiac: Chest pain/pressure [ Y]; Resting SOB [ ] ; Exertional SOB [ ] ; Orthopnea [ ] ; Pedal Edema [ ] ; Palpitations [ ] ; Syncope [ ] ; Presyncope [ ] ; Paroxysmal nocturnal dyspnea[ ]   Pulmonary: Cough [ ] ; Wheezing[ ] ; Hemoptysis[ ] ; Sputum [ ] ; Snoring [ ]   GI: Vomiting[ ] ; Dysphagia[ ] ; Melena[ ] ; Hematochezia [ ] ; Heartburn[ ] ; Abdominal pain [ ] ; Constipation [ ] ; Diarrhea [ ] ; BRBPR [ ]    GU: Hematuria[ ] ; Dysuria [ ] ;  Nocturia[ ]   Vascular: Pain in legs with walking [ ] ; Pain in feet with lying flat [ ] ; Non-healing sores [ ] ; Stroke [ ] ; TIA [ ] ; Slurred speech [ ] ;  Neuro: Headaches[ ] ; Vertigo[ ] ; Seizures[ ] ; Paresthesias[ ] ;Blurred vision [ ] ; Diplopia [ ] ; Vision changes [ ]   Ortho/Skin: Arthritis [ ] ; Joint pain [Y ]; Muscle pain [ ] ; Joint swelling [ ] ; Back Pain [ ] ; Rash [ ]   Psych: Depression[ Y]; Anxiety[ ]   Heme: Bleeding problems [ ] ; Clotting disorders [ ] ; Anemia [ ]   Endocrine: Diabetes [ ] ; Thyroid dysfunction[ ]   Home Medications Prior to Admission medications   Medication Sig Start Date End Date Taking? Authorizing Provider  allopurinol (ZYLOPRIM) 100 MG tablet Take 1 tablet (100 mg total) by mouth daily. 02/05/12  Yes Wynetta Emery, PA-C  ALPRAZolam Duanne Moron) 0.5 MG tablet Take 1 tablet (0.5 mg total) by mouth 3 (three) times daily as needed for anxiety. 10/28/14  Yes Debbrah Alar, NP  apixaban (ELIQUIS) 5 MG TABS tablet Take 1 tablet (5 mg total) by mouth 2 (two) times daily. 09/24/14  Yes Amy D Clegg, NP  atorvastatin (LIPITOR) 80 MG tablet Take 1 tablet (80 mg total) by mouth daily. 10/17/14  Yes Debbrah Alar, NP  carvedilol (COREG) 6.25 MG tablet TAKE 1 TABLET (6.25 MG TOTAL) BY MOUTH 2 (TWO) TIMES DAILY WITH A MEAL. 08/20/14  Yes Larey Dresser,  MD  diphenhydrAMINE (BENADRYL) 50 MG capsule Take 50 mg by mouth daily as needed for itching.   Yes Historical Provider, MD  fluticasone (FLONASE) 50 MCG/ACT nasal spray Place 2 sprays into both nostrils daily as needed for allergies. 10/17/13  Yes Debbrah Alar, NP  gabapentin (NEURONTIN) 300 MG capsule Take 1 capsule (300 mg total) by mouth 3 (three) times daily. Patient taking differently: Take 300 mg by mouth at bedtime.  11/25/14  Yes Debbrah Alar, NP  magnesium oxide (MAG-OX) 400 MG tablet Take 1 tablet (400 mg total) by mouth 3 (three) times daily. Patient taking differently: Take 400 mg by mouth daily.  09/25/14   Yes Amy D Clegg, NP  metolazone (ZAROXOLYN) 2.5 MG tablet Take 1 tablet (2.5 mg total) by mouth daily as needed (for fluid retention). 08/27/13  Yes Shaune Pascal Bensimhon, MD  milrinone [facility]) 20 MG/100ML SOLN infusion Inject 0.375 mcg/kg/min into the vein continuous. 440mg  in 444ml infusion 2.4 ml per hour   Yes Historical Provider, MD  oxyCODONE (OXY IR/ROXICODONE) 5 MG immediate release tablet Take 1 tablet (5 mg total) by mouth every 4 (four) hours as needed for severe pain. 11/23/14  Yes Hannah Muthersbaugh, PA-C  potassium chloride SA (K-DUR,KLOR-CON) 20 MEQ tablet Take 2 tablets (40 mEq total) by mouth 2 (two) times daily. Patient taking differently: Take 30 mEq by mouth daily.  09/25/14  Yes Amy D Clegg, NP  ranitidine (ZANTAC) 150 MG capsule Take 150 mg by mouth daily as needed for heartburn (reflux).   Yes Historical Provider, MD  senna-docusate (SENOKOT-S) 8.6-50 MG per tablet Take 1-2 tablets by mouth 2 (two) times daily as needed for mild constipation or moderate constipation. 10/21/13  Yes Debbrah Alar, NP  sertraline (ZOLOFT) 100 MG tablet Take 1 tablet (100 mg total) by mouth daily. 05/13/14  Yes Debbrah Alar, NP  torsemide (DEMADEX) 20 MG tablet Take 2 tablets (40 mg total) by mouth 2 (two) times daily. 09/11/14  Yes Larey Dresser, MD  zolpidem (AMBIEN) 5 MG tablet TAKE 1 AND 1/2 TABLETS BY MOUTH EVERY DAY AT BEDTIME 07/31/14  Yes Debbrah Alar, NP  eplerenone (INSPRA) 25 MG tablet Take 12.5 mg by mouth every other day.  09/11/13   Rande Brunt, NP  HORIZANT 600 MG TBCR Take 600 mg by mouth at bedtime. 07/08/14   Historical Provider, MD  sucralfate (CARAFATE) 1 GM/10ML suspension Take 1 g by mouth 4 (four) times daily as needed (indigestion).    Historical Provider, MD  sulfamethoxazole-trimethoprim (BACTRIM) 400-80 MG tablet Take 1 tablet by mouth 2 (two) times daily. Patient not taking: Reported on 12/03/2014 09/30/14   Debbrah Alar, NP    Past Medical  History: Past Medical History  Diagnosis Date  . CHF (congestive heart failure) ([place])   . Sarcoidosis ([place])   . Cardiomyopathy, dilated, nonischemic ([place])     non ischemic by cath  . Acute on chronic systolic heart failure ([place])   . Automatic implantable cardiac defibrillator in situ   . Atrial fibrillation ([place])   . NSVT (nonsustained ventricular tachycardia) ([place])   . GERD (gastroesophageal reflux disease)   . Hypercholesteremia   . Shortness of breath   . Chronic kidney disease (CKD), stage III (moderate)   . Pacemaker   . Anginal pain ([place])   . Gout   . Hypertension     dr [name]  . Coronary artery disease   . Elevated PSA 06/24/2013  . Myocardial infarction ([place])   . Headache   .  Neuropathy [facility])     Past Surgical History: Past Surgical History  Procedure Laterality Date  . Pacemaker insertion  2009    with ICD  . Tee without cardioversion  01/17/2011    Procedure: TRANSESOPHAGEAL ECHOCARDIOGRAM (TEE);  Surgeon: Birdie Riddle, MD;  Location: [place];  Service: Cardiovascular;  Laterality: N/A;  . Cardioversion  01/17/2011    Procedure: CARDIOVERSION;  Surgeon: Birdie Riddle, MD;  Location: [place];  Service: Cardiovascular;  Laterality: N/A;  . Cardiac catheterization    . Insert / replace / remove pacemaker    . Anterior cervical decomp/discectomy fusion  08/21/2011    Procedure: ANTERIOR CERVICAL DECOMPRESSION/DISCECTOMY FUSION 2 LEVELS;  Surgeon: Marybelle Killings, MD;  Location: [place];  Service: Orthopedics;  Laterality: N/A;  C5-6, C6-7 Anterior Cervical Discectomy and Fusion, allograft, plate  . Tee without cardioversion N/A 02/16/2012    Procedure: TRANSESOPHAGEAL ECHOCARDIOGRAM (TEE);  Surgeon: Jolaine Artist, MD;  Location[facility] ENDOSCOPY;  Service: Cardiovascular;  Laterality: N/A;  original case scheduled under his dad (who is deceased), rescheduled under correct mrn/pt/dob. [place]/dl  . Tee without cardioversion N/A 03/22/2012    Procedure:  TRANSESOPHAGEAL ECHOCARDIOGRAM (TEE);  Surgeon: Lelon Perla, MD;  Location: [place];  Service: Cardiovascular;  Laterality: N/A;  . Cardioversion N/A 03/22/2012    Procedure: CARDIOVERSION;  Surgeon: Lelon Perla, MD;  Location: [facility] ENDOSCOPY;  Service: Cardiovascular;  Laterality: N/A;  . Cardioversion N/A 04/26/2012    Procedure: CARDIOVERSION;  Surgeon: Jolaine Artist, MD;  Location: [facility] ENDOSCOPY;  Service: Cardiovascular;  Laterality: N/A;  . Central venous catheter tunneled insertion single lumen  09/16/2012    right IJ  . Cardioversion N/A 11/08/2012    Procedure: CARDIOVERSION;  Surgeon: Jolaine Artist, MD;  Location: [facility] ENDOSCOPY;  Service: Cardiovascular;  Laterality: N/A;  . Back surgery  1987, lower    Ruptured disk repair  . Peripherally inserted central catheter insertion  2013  . Right heart catheterization N/A 01/26/2012    Procedure: RIGHT HEART CATH;  Surgeon: Jolaine Artist, MD;  Location: [facility] CATH LAB;  Service: Cardiovascular;  Laterality: N/A;  . Right heart catheterization N/A 04/30/2013    Procedure: RIGHT HEART CATH;  Surgeon: Jolaine Artist, MD;  Location: [facility] CATH LAB;  Service: Cardiovascular;  Laterality: N/A;    Family History: Family History  Problem Relation Age of Onset  . Heart disease    . Heart failure    . Stroke    . Anesthesia problems Neg Hx   . Hypotension Neg Hx   . Malignant hyperthermia Neg Hx   . Pseudochol deficiency Neg Hx   . Heart failure Sister   . Heart attack Sister   . Hyperlipidemia Mother     Social History: Social History   Social History  . Marital Status: Single    Spouse Name: N/A  . Number of Children: N/A  . Years of Education: N/A   Social History Main Topics  . Smoking status: Never Smoker   . Smokeless tobacco: Never Used  . Alcohol Use: No  . Drug Use: No     Comment: last use august 2004  . Sexual Activity: Not Currently   Other Topics Concern  . None   Social History Narrative    3 sons- one in [place], 1 in [place], on in [place]   Retired from [employer]- on disability   Completed Bachelors from [employer]    Allergies:  Allergies  Allergen  Reactions  . Nitroglycerin Other (See Comments)    "feels like head is going to bust open"  . Colchicine Nausea Only    Objective:    Vital Signs:   Temp:  [98.2 F (36.8 C)] 98.2 F (36.8 C) (12/01 1309) Pulse Rate:  [75-87] 75 (12/01 1345) Resp:  [14-22] 14 (12/01 1345) BP: (110-121)/(93-96) 121/95 mmHg (12/01 1345) SpO2:  [92 %-99 %] 95 % (12/01 1345) Weight:  [223 lb (101.152 kg)] 223 lb (101.152 kg) (12/01 1309)    Weight change: Filed Weights   12/03/14 1309  Weight: 223 lb (101.152 kg)    Intake/Output:  No intake or output data in the 24 hours ending 12/03/14 1529   Physical Exam: General:  Fatigued appearing. No resp difficulty HEENT: normal Neck: supple. JVP 5-6 . Carotids 2+ bilat; no bruits. No lymphadenopathy or thryomegaly appreciated. Cor: PMI nondisplaced. Regular rate & rhythm. No rubs, gallops or murmurs. R upper chest  Tunneled catheter. L upper chest scar ICD Lungs: clear Abdomen: soft, obese, nontender, nondistended. No hepatosplenomegaly. No bruits or masses. Good bowel sounds. Extremities: no cyanosis, clubbing, rash, edema Neuro: alert & orientedx3, cranial nerves grossly intact. moves all 4 extremities w/o difficulty. Affect pleasant  Telemetry: NSR 85 bpm   Labs: Basic Metabolic Panel:  Recent Labs Lab 12/03/14 1331  NA 139  K 3.5  CL 105  CO2 25  GLUCOSE 100*  BUN 28*  CREATININE 1.82*  CALCIUM 9.4    Liver Function Tests: No results for input(s): AST, ALT, ALKPHOS, BILITOT, PROT, ALBUMIN in the last 168 hours. No results for input(s): LIPASE, AMYLASE in the last 168 hours. No results for input(s): AMMONIA in the last 168 hours.  CBC:  Recent Labs Lab 12/03/14 1331  WBC 5.8  HGB 14.0  HCT 42.4  MCV 88.7  PLT 244    Cardiac Enzymes: No results for input(s):  CKTOTAL, CKMB, CKMBINDEX, TROPONINI in the last 168 hours.  BNP: BNP (last 3 results)  Recent Labs  07/02/14 1010 07/27/14 1928 09/11/14 1052  BNP 354.6* 407.5* 262.5*    ProBNP (last 3 results)  Recent Labs  12/09/13 2250  PROBNP 665.8*     CBG: No results for input(s): GLUCAP in the last 168 hours.  Coagulation Studies: No results for input(s): LABPROT, INR in the last 72 hours.  Other results: EKG: NSR 81 bpm   Imaging: Dg Chest 2 View  12/03/2014  CLINICAL DATA:  Chest pain EXAM: CHEST  2 VIEW COMPARISON:  07/27/2014 FINDINGS: Cardiac enlargement. AICD unchanged in position. Negative for heart failure. Lungs are clear without infiltrate or effusion Right jugular central venous catheter tip in the SVC is unchanged from the prior study IMPRESSION: No active cardiopulmonary disease. Electronically Signed   By: Franchot Gallo M.D.   On: 12/03/2014 14:32      Medications:     Current Medications: . gi cocktail  30 mL Oral Once     Infusions:      Assessment/Plan   1) Pain - Per [facility] . Chronic . Has been off narcotics for a month. 2) Chronic systolic HF: Nonischemic cardiomyopathy, EF 20-25% (06/2013). St Jude ICD (single chamber, no Corevue). He has been on home milrinone 0.375 mcg/kg/min since January 2014. NYHA class III symptoms.   From HF he is stable. Renal function stable. Continue torsemide to 40 mg bid.  -Continue carvedilol 6.25 mg twice a day.  - No ACEI with CKD. Continue inspra.  - Continue milrinone at 0.375 mcg/kg/min.  3) Atrial arrhythmias: Paroxysmal. - Off amiodarone. Todays he is in NSR. Continue eliquis.    4) CKD, stage III: Renal funciton stable.  5) VT/VF: Most recent 05/19/14. Amio stopped per EP due to dizzinees. May need mexilitene if VT reoccurs.  6) CVA 08/2014 - On eliquis 5 mg twice a day. Continue atorvastatin 40 mg today   He is has follow up in HF clinic December 15.   Length of Stay:   Darrick Grinder NP-C   12/03/2014, 3:29 PM  Advanced Heart Failure Team Pager [phone] (M-F; 7a - 4p)  Please contact [facility] for night-coverage after hours (4p -7a ) and weekends on [email or web address]  Patient seen with NP, agree with the above note.    Volume status looks ok, weight is down, he is in NSR.  No evidence for infection.  The main driver here seems to be chronic chest and neck pain made worse by him being out of his oxycodone.  He has had this pain for a long time and goes to a pain clinic.  Apparently, he broke his contract at pain management.  His chest wall and upper abdomen are tender.  I think he is stable from a cardiac standpoint.  However, will need to figure out how to control his pain and get him back into pain management.  He will probably need a small supply of oxycodone for home until he can followup with pain management.   Loralie Champagne 12/03/2014 4:05 PM

## 2014-12-03 NOTE — ED Notes (Signed)
Pt back from Xray.  Dr. Laneta Simmers made aware.

## 2014-12-03 NOTE — ED Provider Notes (Signed)
CSN: [id number]     Arrival date & time 12/03/14  1300 History   First MD Initiated Contact with Patient 12/03/14 1314     Chief Complaint  Patient presents with  . Chest Pain     (Consider location/radiation/quality/duration/timing/severity/associated sxs/prior Treatment) Patient is a 64 y.o. male presenting with chest pain. The history is provided by the patient.  Chest Pain Pain location:  Substernal area Pain quality: pressure (and heaviness) and sharp   Pain radiates to:  L jaw and neck Pain radiates to the back: no   Pain severity:  Moderate Onset quality:  Gradual Duration:  1 day Timing:  Intermittent Progression:  Waxing and waning Chronicity:  New Context: breathing and at rest   Context comment:  Laying on his back Relieved by:  Nothing Worsened by:  Nothing tried Ineffective treatments:  None tried Associated symptoms: abdominal pain (chronic), cough, dizziness and palpitations   Associated symptoms: no fever, no heartburn, no shortness of breath and not vomiting   Risk factors: coronary artery disease, high cholesterol and hypertension     Past Medical History  Diagnosis Date  . CHF (congestive heart failure) ([place])   . Sarcoidosis ([place])   . Cardiomyopathy, dilated, nonischemic ([place])     non ischemic by cath  . Acute on chronic systolic heart failure ([place])   . Automatic implantable cardiac defibrillator in situ   . Atrial fibrillation ([place])   . NSVT (nonsustained ventricular tachycardia) ([place])   . GERD (gastroesophageal reflux disease)   . Hypercholesteremia   . Shortness of breath   . Chronic kidney disease (CKD), stage III (moderate)   . Pacemaker   . Anginal pain ([place])   . Gout   . Hypertension     dr [name]  . Coronary artery disease   . Elevated PSA 06/24/2013  . Myocardial infarction ([place])   . Headache   . Neuropathy [facility])    Past Surgical History  Procedure Laterality Date  . Pacemaker insertion  2009    with ICD  . Tee without  cardioversion  01/17/2011    Procedure: TRANSESOPHAGEAL ECHOCARDIOGRAM (TEE);  Surgeon: Birdie Riddle, MD;  Location: [place];  Service: Cardiovascular;  Laterality: N/A;  . Cardioversion  01/17/2011    Procedure: CARDIOVERSION;  Surgeon: Birdie Riddle, MD;  Location: [place];  Service: Cardiovascular;  Laterality: N/A;  . Cardiac catheterization    . Insert / replace / remove pacemaker    . Anterior cervical decomp/discectomy fusion  08/21/2011    Procedure: ANTERIOR CERVICAL DECOMPRESSION/DISCECTOMY FUSION 2 LEVELS;  Surgeon: Marybelle Killings, MD;  Location: [place];  Service: Orthopedics;  Laterality: N/A;  C5-6, C6-7 Anterior Cervical Discectomy and Fusion, allograft, plate  . Tee without cardioversion N/A 02/16/2012    Procedure: TRANSESOPHAGEAL ECHOCARDIOGRAM (TEE);  Surgeon: Jolaine Artist, MD;  Location: [facility] ENDOSCOPY;  Service: Cardiovascular;  Laterality: N/A;  original case scheduled under his dad (who is deceased), rescheduled under correct mrn/pt/dob. [place]/dl  . Tee without cardioversion N/A 03/22/2012    Procedure: TRANSESOPHAGEAL ECHOCARDIOGRAM (TEE);  Surgeon: Lelon Perla, MD;  Location: [place];  Service: Cardiovascular;  Laterality: N/A;  . Cardioversion N/A 03/22/2012    Procedure: CARDIOVERSION;  Surgeon: Lelon Perla, MD;  Location: [facility] ENDOSCOPY;  Service: Cardiovascular;  Laterality: N/A;  . Cardioversion N/A 04/26/2012    Procedure: CARDIOVERSION;  Surgeon: Jolaine Artist, MD;  Location: [facility] ENDOSCOPY;  Service: Cardiovascular;  Laterality: N/A;  . Central venous catheter tunneled insertion single  lumen  09/16/2012    right IJ  . Cardioversion N/A 11/08/2012    Procedure: CARDIOVERSION;  Surgeon: Jolaine Artist, MD;  Location: [facility] ENDOSCOPY;  Service: Cardiovascular;  Laterality: N/A;  . Back surgery  1987, lower    Ruptured disk repair  . Peripherally inserted central catheter insertion  2013  . Right heart catheterization N/A 01/26/2012    Procedure:  RIGHT HEART CATH;  Surgeon: Jolaine Artist, MD;  Location: [facility] CATH LAB;  Service: Cardiovascular;  Laterality: N/A;  . Right heart catheterization N/A 04/30/2013    Procedure: RIGHT HEART CATH;  Surgeon: Jolaine Artist, MD;  Location: [facility] CATH LAB;  Service: Cardiovascular;  Laterality: N/A;   Family History  Problem Relation Age of Onset  . Heart disease    . Heart failure    . Stroke    . Anesthesia problems Neg Hx   . Hypotension Neg Hx   . Malignant hyperthermia Neg Hx   . Pseudochol deficiency Neg Hx   . Heart failure Sister   . Heart attack Sister   . Hyperlipidemia Mother    Social History  Substance Use Topics  . Smoking status: Never Smoker   . Smokeless tobacco: Never Used  . Alcohol Use: No    Review of Systems  Constitutional: Negative for fever.  Respiratory: Positive for cough. Negative for shortness of breath.   Cardiovascular: Positive for chest pain and palpitations.  Gastrointestinal: Positive for abdominal pain (chronic). Negative for heartburn and vomiting.  Neurological: Positive for dizziness.  All other systems reviewed and are negative.     Allergies  Nitroglycerin and Colchicine  Home Medications   Prior to Admission medications   Medication Sig Start Date End Date Taking? Authorizing Provider  allopurinol (ZYLOPRIM) 100 MG tablet Take 1 tablet (100 mg total) by mouth daily. 02/05/12  Yes Wynetta Emery, PA-C  ALPRAZolam Duanne Moron) 0.5 MG tablet Take 1 tablet (0.5 mg total) by mouth 3 (three) times daily as needed for anxiety. 10/28/14  Yes Debbrah Alar, NP  apixaban (ELIQUIS) 5 MG TABS tablet Take 1 tablet (5 mg total) by mouth 2 (two) times daily. 09/24/14  Yes Amy D Clegg, NP  atorvastatin (LIPITOR) 80 MG tablet Take 1 tablet (80 mg total) by mouth daily. 10/17/14  Yes Debbrah Alar, NP  carvedilol (COREG) 6.25 MG tablet TAKE 1 TABLET (6.25 MG TOTAL) BY MOUTH 2 (TWO) TIMES DAILY WITH A MEAL. 08/20/14  Yes Larey Dresser, MD   diphenhydrAMINE (BENADRYL) 50 MG capsule Take 50 mg by mouth daily as needed for itching.   Yes Historical Provider, MD  fluticasone (FLONASE) 50 MCG/ACT nasal spray Place 2 sprays into both nostrils daily as needed for allergies. 10/17/13  Yes Debbrah Alar, NP  gabapentin (NEURONTIN) 300 MG capsule Take 1 capsule (300 mg total) by mouth 3 (three) times daily. Patient taking differently: Take 300 mg by mouth at bedtime.  11/25/14  Yes Debbrah Alar, NP  magnesium oxide (MAG-OX) 400 MG tablet Take 1 tablet (400 mg total) by mouth 3 (three) times daily. Patient taking differently: Take 400 mg by mouth daily.  09/25/14  Yes Amy D Clegg, NP  metolazone (ZAROXOLYN) 2.5 MG tablet Take 1 tablet (2.5 mg total) by mouth daily as needed (for fluid retention). 08/27/13  Yes Shaune Pascal Bensimhon, MD  milrinone [facility]) 20 MG/100ML SOLN infusion Inject 0.375 mcg/kg/min into the vein continuous. 440mg  in 450ml infusion 2.4 ml per hour   Yes Historical Provider, MD  oxyCODONE (  OXY IR/ROXICODONE) 5 MG immediate release tablet Take 1 tablet (5 mg total) by mouth every 4 (four) hours as needed for severe pain. 11/23/14  Yes Hannah Muthersbaugh, PA-C  potassium chloride SA (K-DUR,KLOR-CON) 20 MEQ tablet Take 2 tablets (40 mEq total) by mouth 2 (two) times daily. Patient taking differently: Take 30 mEq by mouth daily.  09/25/14  Yes Amy D Clegg, NP  ranitidine (ZANTAC) 150 MG capsule Take 150 mg by mouth daily as needed for heartburn (reflux).   Yes Historical Provider, MD  senna-docusate (SENOKOT-S) 8.6-50 MG per tablet Take 1-2 tablets by mouth 2 (two) times daily as needed for mild constipation or moderate constipation. 10/21/13  Yes Debbrah Alar, NP  sertraline (ZOLOFT) 100 MG tablet Take 1 tablet (100 mg total) by mouth daily. 05/13/14  Yes Debbrah Alar, NP  torsemide (DEMADEX) 20 MG tablet Take 2 tablets (40 mg total) by mouth 2 (two) times daily. 09/11/14  Yes Larey Dresser, MD  zolpidem  (AMBIEN) 5 MG tablet TAKE 1 AND 1/2 TABLETS BY MOUTH EVERY DAY AT BEDTIME 07/31/14  Yes Debbrah Alar, NP  eplerenone (INSPRA) 25 MG tablet Take 12.5 mg by mouth every other day.  09/11/13   Rande Brunt, NP  HORIZANT 600 MG TBCR Take 600 mg by mouth at bedtime. 07/08/14   Historical Provider, MD  sucralfate (CARAFATE) 1 GM/10ML suspension Take 1 g by mouth 4 (four) times daily as needed (indigestion).    Historical Provider, MD  sulfamethoxazole-trimethoprim (BACTRIM) 400-80 MG tablet Take 1 tablet by mouth 2 (two) times daily. Patient not taking: Reported on 12/03/2014 09/30/14   Debbrah Alar, NP   BP 121/95 mmHg  Pulse 75  Temp(Src) 98.2 F (36.8 C) (Oral)  Resp 14  Ht 6\' 3"  (1.905 m)  Wt 223 lb (101.152 kg)  BMI 27.87 kg/m2  SpO2 95% Physical Exam  Constitutional: He is oriented to person, place, and time. He appears well-developed and well-nourished. No distress.  HENT:  Head: Normocephalic and atraumatic.  Eyes: Conjunctivae are normal.  Neck: Neck supple. No tracheal deviation present.  Cardiovascular: Normal rate, regular rhythm and normal heart sounds.   Pulmonary/Chest: Effort normal and breath sounds normal. No respiratory distress. He has no wheezes. He has no rales. He exhibits no tenderness.  Abdominal: Soft. He exhibits no distension. There is no tenderness.  Neurological: He is alert and oriented to person, place, and time.  Skin: Skin is warm and dry.  Psychiatric: He has a normal mood and affect.    ED Course  Procedures (including critical care time) Labs Review Labs Reviewed  BASIC METABOLIC PANEL - Abnormal; Notable for the following:    Glucose, Bld 100 (*)    BUN 28 (*)    Creatinine, Ser 1.82 (*)    GFR calc non Af Amer 38 (*)    GFR calc Af Amer 44 (*)    All other components within normal limits  CBC  BRAIN NATRIURETIC PEPTIDE  URINALYSIS, ROUTINE W REFLEX MICROSCOPIC (NOT AT [facility])  Randolm Idol, ED    Imaging Review Dg Chest 2  View  12/03/2014  CLINICAL DATA:  Chest pain EXAM: CHEST  2 VIEW COMPARISON:  07/27/2014 FINDINGS: Cardiac enlargement. AICD unchanged in position. Negative for heart failure. Lungs are clear without infiltrate or effusion Right jugular central venous catheter tip in the SVC is unchanged from the prior study IMPRESSION: No active cardiopulmonary disease. Electronically Signed   By: Franchot Gallo M.D.   On: 12/03/2014 14:32  I have personally reviewed and evaluated these images and lab results as part of my medical decision-making.   EKG Interpretation   Date/Time:  Thursday December 03 2014 13:02:43 EST Ventricular Rate:  83 PR Interval:  217 QRS Duration: 118 QT Interval:  439 QTC Calculation: 516 R Axis:   -61 Text Interpretation:  Sinus rhythm Ventricular premature complex  Borderline prolonged PR interval Probable left atrial enlargement Left  anterior fascicular block Nonspecific T abnormalities, lateral leads No  significant change since last tracing Confirmed by Dontrey Snellgrove MD, Quillian Quince  [id number]) on 12/03/2014 1:21:50 PM      MDM   Final diagnoses:  Chest pain, unspecified chest pain type  Chest wall pain, chronic    64 year old male presents with onset of chest pain yesterday that is pressure, sharp, left upper chest. He has nonischemic cardiomyopathy and is on a chronic milrinone infusion. He states his pain is new, does not feel like his previous heartburn, feels intermittent palpitations and some shortness of breath with activity. This pain is at rest and his description is concerning for potential cardiac etiology. First troponin is negative, cardiology was called to follow the patient in the hospital and we will admit to the hospitalist for high-risk chest pain rule out.  Patient was evaluated by the hospitalist in the emergency department and double set he was fired from his pain clinic for taking too much medication, he states this is the same as his chronic ongoing upper chest  pain that he was being treated for through them. At this time hospitalist is not offering admission. Patient is in no acute distress and is agreeable to discharge.  Leo Grosser, MD 12/03/14 [phone]

## 2014-12-03 NOTE — Telephone Encounter (Signed)
Ms. Redmond Pulling from [facility] called and left message that patient was experiencing chest pain describes as heaviness with increased shortness of breath and numbness in right arm. EMS was called and he was transported to [facility] ED

## 2014-12-03 NOTE — Progress Notes (Signed)
[facility]  Patient Status: Active patient with [facility]  [facility] is providing the following services:  HHRN and Home Infusion Pharmacy services for home inotropes.  [facility] will follow pt while here in ED to support transition home when ordered.   If patient discharges after hours, please call [phone].   Larry Sierras 12/03/2014, 2:55 PM

## 2014-12-03 NOTE — Discharge Instructions (Signed)

## 2014-12-03 NOTE — ED Notes (Addendum)
Pt arrives from home via [facility] c/o CP onset yesterday, pt states can feel himself going in and out of afib.  Pt reports palpitations, dizziness, some SOB.  Pt denies LOC, n/v. Pt has milranone drip running.   EMS reports giving 324 ASA, pt refused NTG.  Pt moaning and appears to be uncomfortable.

## 2014-12-04 ENCOUNTER — Telehealth: Payer: Self-pay | Admitting: Family

## 2014-12-04 ENCOUNTER — Telehealth: Payer: Self-pay | Admitting: *Deleted

## 2014-12-04 DIAGNOSIS — F419 Anxiety disorder, unspecified: Secondary | ICD-10-CM

## 2014-12-04 MED ORDER — ALPRAZOLAM 0.5 MG PO TABS: 0.5000 mg | ORAL_TABLET | Freq: Three times a day (TID) | ORAL | Status: AC | PRN

## 2014-12-04 NOTE — Telephone Encounter (Signed)
Last alprazolam Rx 10/30/14, #60 x no refills left on pharmacy voicemail.

## 2014-12-04 NOTE — Telephone Encounter (Signed)
Pt called in lvm at 9:18 am stating that he was rushed to the hospital yesterday and was told by his cardiologist that he need to be seen in the office sooner than his 12/12 appt. Called to schedule him in sooner, LVM for pt to call us back to schedule.

## 2014-12-07 ENCOUNTER — Encounter: Payer: Self-pay | Admitting: Internal Medicine

## 2014-12-07 DIAGNOSIS — G894 Chronic pain syndrome: Secondary | ICD-10-CM | POA: Diagnosis not present

## 2014-12-07 DIAGNOSIS — R079 Chest pain, unspecified: Secondary | ICD-10-CM | POA: Diagnosis not present

## 2014-12-07 DIAGNOSIS — Z79899 Other long term (current) drug therapy: Secondary | ICD-10-CM | POA: Diagnosis not present

## 2014-12-07 DIAGNOSIS — M545 Low back pain: Secondary | ICD-10-CM | POA: Diagnosis not present

## 2014-12-08 ENCOUNTER — Other Ambulatory Visit: Payer: Self-pay | Admitting: Family

## 2014-12-08 DIAGNOSIS — M5137 Other intervertebral disc degeneration, lumbosacral region: Secondary | ICD-10-CM | POA: Diagnosis not present

## 2014-12-08 DIAGNOSIS — M47817 Spondylosis without myelopathy or radiculopathy, lumbosacral region: Secondary | ICD-10-CM | POA: Diagnosis not present

## 2014-12-08 NOTE — Telephone Encounter (Signed)
Rx faxed to pharmacy  

## 2014-12-08 NOTE — Telephone Encounter (Signed)
Zolpidem request:  Last Rx:  07/31/14, #45. Next OV:  12/14/14 Rx printed and forwarded to PCP for signature.

## 2014-12-10 DIAGNOSIS — I509 Heart failure, unspecified: Secondary | ICD-10-CM | POA: Diagnosis not present

## 2014-12-10 DIAGNOSIS — R0602 Shortness of breath: Secondary | ICD-10-CM | POA: Diagnosis not present

## 2014-12-11 DIAGNOSIS — I5022 Chronic systolic (congestive) heart failure: Secondary | ICD-10-CM | POA: Diagnosis not present

## 2014-12-14 ENCOUNTER — Encounter: Payer: Self-pay | Admitting: Family

## 2014-12-14 ENCOUNTER — Ambulatory Visit (INDEPENDENT_AMBULATORY_CARE_PROVIDER_SITE_OTHER): Payer: Medicare Other | Admitting: Family

## 2014-12-14 VITALS — BP 127/88 | HR 91 | Temp 98.4°F | Resp 16 | Ht 75.0 in | Wt 233.0 lb

## 2014-12-14 DIAGNOSIS — Z79899 Other long term (current) drug therapy: Secondary | ICD-10-CM | POA: Diagnosis not present

## 2014-12-14 DIAGNOSIS — F418 Other specified anxiety disorders: Secondary | ICD-10-CM

## 2014-12-14 DIAGNOSIS — I429 Cardiomyopathy, unspecified: Secondary | ICD-10-CM

## 2014-12-14 DIAGNOSIS — I5023 Acute on chronic systolic (congestive) heart failure: Secondary | ICD-10-CM

## 2014-12-14 DIAGNOSIS — G894 Chronic pain syndrome: Secondary | ICD-10-CM

## 2014-12-14 DIAGNOSIS — G8929 Other chronic pain: Secondary | ICD-10-CM

## 2014-12-14 DIAGNOSIS — I420 Dilated cardiomyopathy: Secondary | ICD-10-CM

## 2014-12-14 DIAGNOSIS — R1013 Epigastric pain: Secondary | ICD-10-CM

## 2014-12-14 MED ORDER — METOLAZONE 2.5 MG PO TABS: 2.5000 mg | ORAL_TABLET | Freq: Every day | ORAL | Status: AC | PRN

## 2014-12-14 NOTE — Assessment & Plan Note (Signed)
Pt has upcoming apt with new pain clinic. Advised pt to keep this appointment.  Due to ongoing epigastric pain will refer back to Dr. Ardis Hughs.

## 2014-12-14 NOTE — Progress Notes (Signed)
Subjective:    Patient ID: [name], male    DOB: 1950-02-02, 64 y.o.   MRN: [id number]  HPI   Mr.  [name] is a 64 yr old male who presents today for follow up.  1) HTN-  Pt is maintained on coreg/zaroxolyn/demadex.  BP Readings from Last 3 Encounters:  12/14/14 127/88  12/03/14 116/99  11/25/14 118/86   2) Depression/Anxiety- Uses xanax prn, not really helping his anxiety. He continues sertraline daily.Reports home situation is very stressful for him and he is looking into possibility of moving out.  3) Chronic pain- Pt was seen in the ED on 12/03/14 with chief complaint of chest pain-  Troponin was negative.  Has upcoming appointment with [facility].   4) CHF- continues milrinone drip.  Reports that his weight has "shot up."  Wt Readings from Last 3 Encounters:  12/14/14 233 lb (105.688 kg)  12/03/14 223 lb (101.152 kg)  11/25/14 231 lb 6.4 oz (104.962 kg)  Has ran out of metolazone x 10 days.     Review of Systems See HPI  Past Medical History  Diagnosis Date  . CHF (congestive heart failure) ([place])   . Sarcoidosis ([place])   . Cardiomyopathy, dilated, nonischemic ([place])     non ischemic by cath  . Acute on chronic systolic heart failure ([place])   . Automatic implantable cardiac defibrillator in situ   . Atrial fibrillation ([place])   . NSVT (nonsustained ventricular tachycardia) ([place])   . GERD (gastroesophageal reflux disease)   . Hypercholesteremia   . Shortness of breath   . Chronic kidney disease (CKD), stage III (moderate)   . Pacemaker   . Anginal pain ([place])   . Gout   . Hypertension     dr [name]  . Coronary artery disease   . Elevated PSA 06/24/2013  . Myocardial infarction ([place])   . Headache   . Neuropathy [facility])     Social History   Social History  . Marital Status: Single    Spouse Name: N/A  . Number of Children: N/A  . Years of Education: N/A   Occupational History  . Not on file.   Social History Main Topics  .  Smoking status: Never Smoker   . Smokeless tobacco: Never Used  . Alcohol Use: No  . Drug Use: No     Comment: last use august 2004  . Sexual Activity: Not Currently   Other Topics Concern  . Not on file   Social History Narrative   3 sons- one in [place], 1 in [place], on in [place]   Retired from [employer]- on disability   Completed Bachelors from [employer]    Past Surgical History  Procedure Laterality Date  . Pacemaker insertion  2009    with ICD  . Tee without cardioversion  01/17/2011    Procedure: TRANSESOPHAGEAL ECHOCARDIOGRAM (TEE);  Surgeon: Birdie Riddle, MD;  Location: [place];  Service: Cardiovascular;  Laterality: N/A;  . Cardioversion  01/17/2011    Procedure: CARDIOVERSION;  Surgeon: Birdie Riddle, MD;  Location: [place];  Service: Cardiovascular;  Laterality: N/A;  . Cardiac catheterization    . Insert / replace / remove pacemaker    . Anterior cervical decomp/discectomy fusion  08/21/2011    Procedure: ANTERIOR CERVICAL DECOMPRESSION/DISCECTOMY FUSION 2 LEVELS;  Surgeon: Marybelle Killings, MD;  Location: [place];  Service: Orthopedics;  Laterality: N/A;  C5-6, C6-7 Anterior Cervical Discectomy and Fusion, allograft, plate  .  Tee without cardioversion N/A 02/16/2012    Procedure: TRANSESOPHAGEAL ECHOCARDIOGRAM (TEE);  Surgeon: Jolaine Artist, MD;  Location: [facility] ENDOSCOPY;  Service: Cardiovascular;  Laterality: N/A;  original case scheduled under his dad (who is deceased), rescheduled under correct mrn/pt/dob. [place]/dl  . Tee without cardioversion N/A 03/22/2012    Procedure: TRANSESOPHAGEAL ECHOCARDIOGRAM (TEE);  Surgeon: Lelon Perla, MD;  Location: [place];  Service: Cardiovascular;  Laterality: N/A;  . Cardioversion N/A 03/22/2012    Procedure: CARDIOVERSION;  Surgeon: Lelon Perla, MD;  Location: [facility] ENDOSCOPY;  Service: Cardiovascular;  Laterality: N/A;  . Cardioversion N/A 04/26/2012    Procedure: CARDIOVERSION;  Surgeon: Jolaine Artist, MD;  Location: [facility]  ENDOSCOPY;  Service: Cardiovascular;  Laterality: N/A;  . Central venous catheter tunneled insertion single lumen  09/16/2012    right IJ  . Cardioversion N/A 11/08/2012    Procedure: CARDIOVERSION;  Surgeon: Jolaine Artist, MD;  Location: [facility] ENDOSCOPY;  Service: Cardiovascular;  Laterality: N/A;  . Back surgery  1987, lower    Ruptured disk repair  . Peripherally inserted central catheter insertion  2013  . Right heart catheterization N/A 01/26/2012    Procedure: RIGHT HEART CATH;  Surgeon: Jolaine Artist, MD;  Location: [facility] CATH LAB;  Service: Cardiovascular;  Laterality: N/A;  . Right heart catheterization N/A 04/30/2013    Procedure: RIGHT HEART CATH;  Surgeon: Jolaine Artist, MD;  Location: [facility] CATH LAB;  Service: Cardiovascular;  Laterality: N/A;    Family History  Problem Relation Age of Onset  . Heart disease    . Heart failure    . Stroke    . Anesthesia problems Neg Hx   . Hypotension Neg Hx   . Malignant hyperthermia Neg Hx   . Pseudochol deficiency Neg Hx   . Heart failure Sister   . Heart attack Sister   . Hyperlipidemia Mother     Allergies  Allergen Reactions  . Nitroglycerin Other (See Comments)    "feels like head is going to bust open"  . Colchicine Nausea Only    Current Outpatient Prescriptions on File Prior to Visit  Medication Sig Dispense Refill  . allopurinol (ZYLOPRIM) 100 MG tablet Take 1 tablet (100 mg total) by mouth daily. 30 tablet 6  . ALPRAZolam (XANAX) 0.5 MG tablet Take 1 tablet (0.5 mg total) by mouth 3 (three) times daily as needed for anxiety. 60 tablet 0  . apixaban (ELIQUIS) 5 MG TABS tablet Take 1 tablet (5 mg total) by mouth 2 (two) times daily. 60 tablet 3  . atorvastatin (LIPITOR) 80 MG tablet Take 1 tablet (80 mg total) by mouth daily. 30 tablet 3  . carvedilol (COREG) 6.25 MG tablet TAKE 1 TABLET (6.25 MG TOTAL) BY MOUTH 2 (TWO) TIMES DAILY WITH A MEAL. 60 tablet 3  . diphenhydrAMINE (BENADRYL) 50 MG capsule Take 50 mg by  mouth daily as needed for itching.    Marland Kitchen eplerenone (INSPRA) 25 MG tablet Take 12.5 mg by mouth every other day.     . fluticasone (FLONASE) 50 MCG/ACT nasal spray Place 2 sprays into both nostrils daily as needed for allergies. 16 g 2  . gabapentin (NEURONTIN) 300 MG capsule Take 1 capsule (300 mg total) by mouth 3 (three) times daily. (Patient taking differently: Take 300 mg by mouth at bedtime. ) 90 capsule 0  . HORIZANT 600 MG TBCR Take 600 mg by mouth at bedtime.    . magnesium oxide (MAG-OX) 400 MG tablet Take 1 tablet (  400 mg total) by mouth 3 (three) times daily. (Patient taking differently: Take 400 mg by mouth daily. ) 90 tablet 3  . metolazone (ZAROXOLYN) 2.5 MG tablet Take 1 tablet (2.5 mg total) by mouth daily as needed (for fluid retention). 10 tablet 6  . milrinone (PRIMACOR) 20 MG/100ML SOLN infusion Inject 0.375 mcg/kg/min into the vein continuous. 440mg  in 484ml infusion 2.4 ml per hour    . potassium chloride SA (K-DUR,KLOR-CON) 20 MEQ tablet Take 2 tablets (40 mEq total) by mouth 2 (two) times daily. (Patient taking differently: Take 30 mEq by mouth daily. ) 120 tablet 6  . ranitidine (ZANTAC) 150 MG capsule Take 150 mg by mouth daily as needed for heartburn (reflux).    Marland Kitchen senna-docusate (SENOKOT-S) 8.6-50 MG per tablet Take 1-2 tablets by mouth 2 (two) times daily as needed for mild constipation or moderate constipation. 60 tablet 3  . sertraline (ZOLOFT) 100 MG tablet Take 1 tablet (100 mg total) by mouth daily. 90 tablet 1  . sucralfate (CARAFATE) 1 GM/10ML suspension Take 1 g by mouth 4 (four) times daily as needed (indigestion).    . torsemide (DEMADEX) 20 MG tablet Take 2 tablets (40 mg total) by mouth 2 (two) times daily. 120 tablet 6  . zolpidem (AMBIEN) 5 MG tablet TAKE 1 AND 1/2 TABLET BY MOUTH EVERY DAY AT BEDTIME 45 tablet 0   No current facility-administered medications on file prior to visit.    BP 127/88 mmHg  Pulse 91  Temp(Src) 98.4 F (36.9 C) (Oral)  Resp  16  Ht 6\' 3"  (1.905 m)  Wt 233 lb (105.688 kg)  BMI 29.12 kg/m2  SpO2 99%       Objective:   Physical Exam  Constitutional: He is oriented to person, place, and time. He appears well-developed and well-nourished. No distress.  HENT:  Head: Normocephalic and atraumatic.  Cardiovascular: Normal rate and regular rhythm.   No murmur heard. Pulmonary/Chest: Effort normal and breath sounds normal. No respiratory distress. He has no wheezes. He has no rales.  Abdominal: Soft. He exhibits no distension. There is tenderness in the epigastric area.  Musculoskeletal: He exhibits no edema.  Neurological: He is alert and oriented to person, place, and time.  Skin: Skin is warm and dry.  Psychiatric: His behavior is normal. Thought content normal.  Flat affect          Assessment & Plan:

## 2014-12-14 NOTE — Assessment & Plan Note (Signed)
Deteriorated due to situational stressors and overall poor health. Continue sertraline, recommended referral to counselor- pt declines at this time, continue prn xanax, controlled substance contract signed today. Obtain UDS

## 2014-12-14 NOTE — Assessment & Plan Note (Signed)
Suspect some volume overload based on weight, resume prn zaroxolyn (refill sent).

## 2014-12-14 NOTE — Patient Instructions (Addendum)
Please complete lab work prior to leaving (UDS) Restart zaroxolyn once daily as needed. You will be contacted about your referral back to Dr. Ardis Hughs.

## 2014-12-15 ENCOUNTER — Other Ambulatory Visit (HOSPITAL_COMMUNITY): Payer: Self-pay | Admitting: Internal Medicine

## 2014-12-17 ENCOUNTER — Ambulatory Visit (HOSPITAL_COMMUNITY)
Admission: RE | Admit: 2014-12-17 | Discharge: 2014-12-17 | Disposition: A | Discharge status: Home / Self Care | Payer: Medicare Other | Source: Ambulatory Visit | Attending: Internal Medicine | Admitting: Internal Medicine

## 2014-12-17 ENCOUNTER — Encounter (HOSPITAL_COMMUNITY)
Admission: RE | Admit: 2014-12-17 | Discharge: 2014-12-17 | Disposition: A | Discharge status: Home / Self Care | Payer: Medicare Other | Source: Ambulatory Visit | Attending: Internal Medicine | Admitting: Internal Medicine

## 2014-12-17 VITALS — BP 112/80 | HR 84 | Wt 224.0 lb

## 2014-12-17 DIAGNOSIS — I509 Heart failure, unspecified: Secondary | ICD-10-CM | POA: Diagnosis not present

## 2014-12-17 DIAGNOSIS — Z79899 Other long term (current) drug therapy: Secondary | ICD-10-CM | POA: Insufficient documentation

## 2014-12-17 DIAGNOSIS — G629 Polyneuropathy, unspecified: Secondary | ICD-10-CM | POA: Diagnosis not present

## 2014-12-17 DIAGNOSIS — N184 Chronic kidney disease, stage 4 (severe): Secondary | ICD-10-CM

## 2014-12-17 DIAGNOSIS — I4891 Unspecified atrial fibrillation: Secondary | ICD-10-CM | POA: Insufficient documentation

## 2014-12-17 DIAGNOSIS — M542 Cervicalgia: Secondary | ICD-10-CM | POA: Insufficient documentation

## 2014-12-17 DIAGNOSIS — I5022 Chronic systolic (congestive) heart failure: Secondary | ICD-10-CM

## 2014-12-17 DIAGNOSIS — N183 Chronic kidney disease, stage 3 (moderate): Secondary | ICD-10-CM | POA: Insufficient documentation

## 2014-12-17 DIAGNOSIS — I129 Hypertensive chronic kidney disease with stage 1 through stage 4 chronic kidney disease, or unspecified chronic kidney disease: Secondary | ICD-10-CM | POA: Insufficient documentation

## 2014-12-17 DIAGNOSIS — Z95810 Presence of automatic (implantable) cardiac defibrillator: Secondary | ICD-10-CM | POA: Insufficient documentation

## 2014-12-17 DIAGNOSIS — Z452 Encounter for adjustment and management of vascular access device: Secondary | ICD-10-CM | POA: Insufficient documentation

## 2014-12-17 DIAGNOSIS — I2510 Atherosclerotic heart disease of native coronary artery without angina pectoris: Secondary | ICD-10-CM | POA: Diagnosis not present

## 2014-12-17 DIAGNOSIS — K219 Gastro-esophageal reflux disease without esophagitis: Secondary | ICD-10-CM | POA: Diagnosis not present

## 2014-12-17 DIAGNOSIS — I252 Old myocardial infarction: Secondary | ICD-10-CM | POA: Diagnosis not present

## 2014-12-17 DIAGNOSIS — I5023 Acute on chronic systolic (congestive) heart failure: Secondary | ICD-10-CM | POA: Insufficient documentation

## 2014-12-17 DIAGNOSIS — R972 Elevated prostate specific antigen [PSA]: Secondary | ICD-10-CM | POA: Diagnosis not present

## 2014-12-17 DIAGNOSIS — G8929 Other chronic pain: Secondary | ICD-10-CM | POA: Diagnosis not present

## 2014-12-17 DIAGNOSIS — I420 Dilated cardiomyopathy: Secondary | ICD-10-CM | POA: Insufficient documentation

## 2014-12-17 DIAGNOSIS — R0602 Shortness of breath: Secondary | ICD-10-CM | POA: Diagnosis not present

## 2014-12-17 DIAGNOSIS — E7800 Pure hypercholesterolemia, unspecified: Secondary | ICD-10-CM | POA: Diagnosis not present

## 2014-12-17 MED ORDER — ALTEPLASE 2 MG IJ SOLR
2.0000 mg | Freq: Once | INTRAMUSCULAR | Status: AC
  Administered 2014-12-17: 2 mg
  Filled 2014-12-17: qty 2

## 2014-12-17 MED ORDER — POTASSIUM CHLORIDE ER 20 MEQ PO TBCR: 40.0000 meq | EXTENDED_RELEASE_TABLET | Freq: Two times a day (BID) | ORAL | Status: AC

## 2014-12-17 NOTE — Progress Notes (Signed)
Advanced Heart Failure Medication Review by a Pharmacist  Does the patient  feel that his/her medications are working for him/her?  yes  Has the patient been experiencing any side effects to the medications prescribed?  no  Does the patient measure his/her own blood pressure or blood glucose at home?  no   Does the patient have any problems obtaining medications due to transportation or finances?   no  Understanding of regimen: good Understanding of indications: good Potential of compliance: good Patient understands to avoid NSAIDs. Patient understands to avoid decongestants.  Issues to address at subsequent visits: None   Pharmacist comments:  [name] is a pleasant 64 yo M presenting without a medication list. He reports good compliance with his medications and states that he has not used potassium 2/2 Dr. Olin Pia recommendation. He did not have any specific medication-related questions or concerns for me at this time.   [name]. [name], PharmD, BCPS, CPP Clinical Pharmacist Pager: [phone] Phone: [phone] 12/17/2014 12:11 PM      Time with patient: 8 minutes Preparation and documentation time: 2 minutes Total time: 10 minutes

## 2014-12-17 NOTE — Patient Instructions (Signed)
RESTART Potassium 40 MeQ, twice a day  Your physician recommends that you schedule a follow-up appointment in: 6 weeks  Do the following things EVERYDAY: 1) Weigh yourself in the morning before breakfast. Write it down and keep it in a log. 2) Take your medicines as prescribed 3) Eat low salt foods-Limit salt (sodium) to 2000 mg per day.  4) Stay as active as you can everyday 5) Limit all fluids for the day to less than 2 liters 6)

## 2014-12-17 NOTE — Progress Notes (Signed)
Patient ID: [name], male   DOB: 27-Nov-1950, 64 y.o.   MRN: [id number]     PCP: Debbrah Alar  HF Cardiologist: Dr Haroldine Laws   HPI: [name] is a 64 y.o. 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 He also has history of ventricular tachycardia s/p St Jude ICD (single lead, no Corevue) by Dr. Lovena Le, CKD stage IV, and paroxysmal atrial arrhythmias on amiodarone as well as sarcoidosis and hypertension. Cath in 2008 by Dr. Terrence Dupont showed no CAD.  Seen at [facility] and not felt to be a transplant candidate due to renal failure and lack of family support.  Milrinone initiated in January 2014 for low output. 03/01/12 milrinone increased 0.375 mcg/kg/min.   04/26/12 S/P Successful DC-CV for atrial fibrillation. Remains on amiodarone.   Admitted to [facility] 01/09/14-01/15/2014 with an infected PICC. Gram negative bacteremia with antibiotic course. Hospitalization was complicated by VT/VF. Discharge weight was 240 pounds.   Since last appointment here, he was admitted to the hospital in [facility] for 3 weeks in 8/16.  He was initially admitted with chest pain, dyspnea, and abdominal pain.  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  His stay was complicated by delirium with hallucinations.  He had hematuria and Eliquis was stopped.  He says they told him that he had a bladder infection.  Not currently having hematuria.  By the end of his hospitalization, he was very debilitated and barely able to walk.  He is now back at home.    He remains on Milrinone 0.375 mcg/kg/min. Evaluated on 12/1 in the ED for chest and neck discomfort. He ws turned away form pain clinic because he broke the pain contract. He was later sent home with follow up with PCP. He has been referred to a new pain specialist. Says he is stressed about his home situation. Denies  SOB/PND/Orthopnea. Weight at home 222-232 pounds. Took metolazone 2 days ago.  Taking all medications but has not had potassium in a week. Marland Kitchen He remains on Eliquis. No hematuria. Followed by [facility]. No BRBPR .   ECHO 03/22/12 EF 15%  Echo 6/15: EF 20-25%  RHC 04/30/13 RA = 4  RV = 20/1/3  PA = 23/8 (15)  PCW = 5  Fick cardiac output/index =4.5/1.9  Thermo CO/CI = 3.9/1.6  PVR =  FA sat = 98%  PA sat = 62%, 64% - On milrinone 0.25  He is not on an ace inhibitor due to elevated creatinine. Not on nitrates due to Viagra use.   Labs:  08/15/12 Creatinine 4.24 Potassium 3.3 09/19/12 Creatinine 3.98, Potassium 3.1, BUN 78 10/07/12 Creatinine 2.62, Potassium 3.4 10/18/12 Creatinine 3.3, K+3.1 11/08/12 Creatinine 2.15 K 4.0 11/25/12 Creatinine 2.93, K+ 3.5 03/10/13: K+ 2.9, Creatinine 2.63 04/30/13: K+ 3.7, creatinine 2.4 6/15: K 3, creatinine 3.6 09/11/13: K 3.1 Cr 1.87  10/15: K 3.8, creatinine 2.13, HCT 40.7 11/06/13 K 4.0 Creatinine 1.58  12/17/13 K 3.6 Creatinine 2.3  3/16 K 3.9, creatinine 2.02 04/2014: K 3.4 => 3.7,  Creatinine 2.37 => 2.02, TSH/free T4/free T3 normal, AST 45, ALT 40 05/19/2014: L 4.0 Creatinine 1.51 Mag 2.2 BNP 53 9/16: K 3.2, creatinine 1.37, hgb 11.7, plts 196 09/23/2014: K 3.3 Creatinine 1.72  10/15/14 Creatine 2.27 10/29/14 Creatinine 2.53 torsemide held for 2 days.  12/03/2014: K 3.5 Creatinine 1.82     ROS: All  systems negative except as listed in HPI, PMH and Problem List.  Past Medical History  Diagnosis Date  . CHF (congestive heart failure) ([place])   . Sarcoidosis ([place])   . Cardiomyopathy, dilated, nonischemic ([place])     non ischemic by cath  . Acute on chronic systolic heart failure ([place])   . Automatic implantable cardiac defibrillator in situ   . Atrial fibrillation ([place])   . NSVT (nonsustained ventricular tachycardia) ([place])   . GERD (gastroesophageal reflux disease)   . Hypercholesteremia   . Shortness of breath   . Chronic kidney disease (CKD),  stage III (moderate)   . Pacemaker   . Anginal pain ([place])   . Gout   . Hypertension     dr [name]  . Coronary artery disease   . Elevated PSA 06/24/2013  . Myocardial infarction ([place])   . Headache   . Neuropathy [facility])    Current Outpatient Prescriptions  Medication Sig Dispense Refill  . allopurinol (ZYLOPRIM) 100 MG tablet Take 1 tablet (100 mg total) by mouth daily. 30 tablet 6  . ALPRAZolam (XANAX) 0.5 MG tablet Take 1 tablet (0.5 mg total) by mouth 3 (three) times daily as needed for anxiety. 60 tablet 0  . apixaban (ELIQUIS) 5 MG TABS tablet Take 1 tablet (5 mg total) by mouth 2 (two) times daily. 60 tablet 3  . atorvastatin (LIPITOR) 80 MG tablet Take 1 tablet (80 mg total) by mouth daily. 30 tablet 3  . carvedilol (COREG) 6.25 MG tablet TAKE 1 TABLET (6.25 MG TOTAL) BY MOUTH 2 (TWO) TIMES DAILY WITH A MEAL. 60 tablet 3  . diphenhydrAMINE (BENADRYL) 50 MG capsule Take 50 mg by mouth daily as needed for itching.    Marland Kitchen eplerenone (INSPRA) 25 MG tablet Take 12.5 mg by mouth every other day.     . fluticasone (FLONASE) 50 MCG/ACT nasal spray Place 2 sprays into both nostrils daily as needed for allergies. 16 g 2  . gabapentin (NEURONTIN) 300 MG capsule Take 300 mg by mouth at bedtime.    . metolazone (ZAROXOLYN) 2.5 MG tablet Take 1 tablet (2.5 mg total) by mouth daily as needed (for fluid retention). 10 tablet 6  . milrinone (PRIMACOR) 20 MG/100ML SOLN infusion Inject 0.375 mcg/kg/min into the vein continuous. 440mg  in 489ml infusion 2.4 ml per hour    . oxyCODONE-acetaminophen (PERCOCET) 10-325 MG tablet Take 1 tablet by mouth every 6 (six) hours as needed for pain.     . ranitidine (ZANTAC) 150 MG capsule Take 150 mg by mouth daily as needed for heartburn (reflux).    Marland Kitchen senna-docusate (SENOKOT-S) 8.6-50 MG per tablet Take 1-2 tablets by mouth 2 (two) times daily as needed for mild constipation or moderate constipation. 60 tablet 3  . sertraline (ZOLOFT) 100 MG tablet Take 1 tablet  (100 mg total) by mouth daily. 90 tablet 1  . torsemide (DEMADEX) 20 MG tablet Take 80 mg by mouth daily.    Marland Kitchen zolpidem (AMBIEN) 5 MG tablet TAKE 1 AND 1/2 TABLET BY MOUTH EVERY DAY AT BEDTIME 45 tablet 0   No current facility-administered medications for this encounter.   BP 112/80 mmHg  Pulse 84  Wt 224 lb (101.606 kg)  SpO2 97%  PHYSICAL EXAM: General: NAD; Chronically ill appearing.  Ambulated in clinic without difficulty.  HEENT: normal  Neck: supple. JVP 6-7 cm. Carotids 2+ bilat; no bruits. No lymphadenopathy or thyromegaly appreciated.  Cor: PMI laterally displaced. Regular S1 S2 with no S3 and 2/6  HSM LLSB. R upper chest hickman site with milrinone infusing.  Lungs: Clear   Abdomen: soft, nontender, obese. No bruits or masses. Good bowel sounds.  Moderate abdominal distention.  Extremities: no cyanosis, clubbing, rash, no lower extremity edema  Neuro: alert & orientedx3, cranial nerves grossly intact. moves all 4 extremities w/o difficulty. Affect pleasant     ASSESSMENT & PLAN:   1) Chronic systolic HF: Nonischemic cardiomyopathy, EF 20-25% (06/2013). St Jude ICD (single chamber, no Corevue). He has been on home milrinone 0.375 mcg/kg/min since January 2014. NYHA class III symptoms. Volume status stable.  Continue torsemide to 40 mg bid. Today I will start 40 meq potassium twice a day. He has not been taking potassium.  -Continue carvedilol 6.25 mg twice a day.  - No ACEI with CKD. Continue inspra.   - Continue milrinone at 0.375 mcg/kg/min.  Check labs weekly.   2) Atrial arrhythmias: Paroxysmal. - Continue amiodarone. He will need TSH and LFTs at next appointment, should get yearly eye exams.  - Had problems with hematuria during recent hospital admit Eliquis was stopped. No further hematuria.  Urology appointment scheduled.  Continue  eliquis 5 mg twice a day.     3) CKD, stage III: Creatinine baseline 1.7-2.0   4) VT/VF: Most recent  05/19/14. Amio stopped per EP due to  dizzinees. May need mexilitene if VT reoccurs.    5) CVA 08/2014 - On eliquis 5 mg twice a day. Continue atorvastatin 40 mg today  6) Chronic Pain- Followed at the pain clinic and will follow up next week for pain in neck and back.    Follow up in 4-6 weeks NP.  Darrell Clegg NP-C  12/17/2014

## 2014-12-18 ENCOUNTER — Other Ambulatory Visit (HOSPITAL_COMMUNITY): Payer: Self-pay | Admitting: Internal Medicine

## 2014-12-18 DIAGNOSIS — G8929 Other chronic pain: Secondary | ICD-10-CM | POA: Insufficient documentation

## 2014-12-24 ENCOUNTER — Ambulatory Visit (INDEPENDENT_AMBULATORY_CARE_PROVIDER_SITE_OTHER): Payer: Medicare Other | Admitting: Physician Assistant

## 2014-12-24 ENCOUNTER — Encounter: Payer: Self-pay | Admitting: Physician Assistant

## 2014-12-24 ENCOUNTER — Encounter: Payer: Self-pay | Admitting: Family

## 2014-12-24 VITALS — BP 130/88 | HR 77 | Ht 76.0 in | Wt 229.0 lb

## 2014-12-24 DIAGNOSIS — R1013 Epigastric pain: Secondary | ICD-10-CM | POA: Diagnosis not present

## 2014-12-24 DIAGNOSIS — I509 Heart failure, unspecified: Secondary | ICD-10-CM | POA: Diagnosis not present

## 2014-12-24 DIAGNOSIS — R1310 Dysphagia, unspecified: Secondary | ICD-10-CM

## 2014-12-24 DIAGNOSIS — G8929 Other chronic pain: Secondary | ICD-10-CM

## 2014-12-24 DIAGNOSIS — R0602 Shortness of breath: Secondary | ICD-10-CM | POA: Diagnosis not present

## 2014-12-24 DIAGNOSIS — I5022 Chronic systolic (congestive) heart failure: Secondary | ICD-10-CM | POA: Diagnosis not present

## 2014-12-24 MED ORDER — DEXLANSOPRAZOLE 60 MG PO CPDR: 60.0000 mg | DELAYED_RELEASE_CAPSULE | Freq: Every day | ORAL | Status: AC

## 2014-12-24 MED ORDER — ONDANSETRON HCL 4 MG PO TABS
4.0000 mg | ORAL_TABLET | Freq: Three times a day (TID) | ORAL | Status: AC | PRN
Start: 2014-12-24 — End: ?

## 2014-12-24 NOTE — Progress Notes (Signed)
i agree with the above note, plan 

## 2014-12-24 NOTE — Patient Instructions (Signed)
We have sent the following medications to your pharmacy for you to pick up at your convenience: Dexilant and Zofran  Please follow up with Dr Ardis Hughs in early Feb 2017. [phone]  You have been scheduled for a gastric emptying scan at [facility] on 01-14-15 at 7:00 am. Please arrive at least 15 minutes prior to your appointment for registration. Please make certain not to have anything to eat or drink after midnight the night before your test. Hold all stomach medications (ex: Zofran, phenergan, Reglan) 48 hours prior to your test. If you need to reschedule your appointment, please contact radiology scheduling at [phone]. _____________________________________________________________________ A gastric-emptying study measures how long it takes for food to move through your stomach. There are several ways to measure stomach emptying. In the most common test, you eat food that contains a small amount of radioactive material. A scanner that detects the movement of the radioactive material is placed over your abdomen to monitor the rate at which food leaves your stomach. This test normally takes about 2 hours to complete. _____________________________________________________________________   [name] have been scheduled for a Barium Esophogram at [facility] (1st floor of the hospital) on 02/18/2015 at 9:30am. Please arrive 15 minutes prior to your appointment for registration. Make certain not to have anything to eat or drink 6 hours prior to your test. If you need to reschedule for any reason, please contact radiology at [phone] to do so. __________________________________________________________________ A barium swallow is an examination that concentrates on views of the esophagus. This tends to be a double contrast exam (barium and two liquids which, when combined, create a gas to distend the wall of the oesophagus) or single contrast (non-ionic iodine based). The study is  usually tailored to your symptoms so a good history is essential. Attention is paid during the study to the form, structure and configuration of the esophagus, looking for functional disorders (such as aspiration, dysphagia, achalasia, motility and reflux) EXAMINATION You may be asked to change into a gown, depending on the type of swallow being performed. A radiologist and radiographer will perform the procedure. The radiologist will advise you of the type of contrast selected for your procedure and direct you during the exam. You will be asked to stand, sit or lie in several different positions and to hold a small amount of fluid in your mouth before being asked to swallow while the imaging is performed .In some instances you may be asked to swallow barium coated marshmallows to assess the motility of a solid food bolus. The exam can be recorded as a digital or video fluoroscopy procedure. POST PROCEDURE It will take 1-2 days for the barium to pass through your system. To facilitate this, it is important, unless otherwise directed, to increase your fluids for the next 24-48hrs and to resume your normal diet.  This test typically takes about 30 minutes to perform. __________________________________________________________________________________

## 2014-12-24 NOTE — Progress Notes (Addendum)
Patient ID: [name], male   DOB: 1950-11-24, 64 y.o.   MRN: [id number]     History of Present Illness: [name] is a 64 year old gentleman who is known to Dr. Ardis Hughs. He has been evaluated for intermittent vomiting and abdominal pain in the past. He had an abdominal ultrasound in August 2015 showed the suggestion of mild gallbladder sludge, mild hepatic steatosis without focal mass, and subcentimeter left renal cyst, but was otherwise unremarkable. He had an EGD in June 2012 by Dr. school her for evaluation of epigastric pain that was normal. He T scan in January 2014 without contrast for evaluation of epigastric abdominal pain. He was set to have a colonoscopy and EGD in September 2015 with Dr. Ardis Hughs at [facility], but presented after his prep hypotensive with a heart rate 110 to 1:30. Procedure was canceled. October 2015 the decision was made to treat GI symptoms empirically with Gas-X and an H2 blocker.  His past medical history significant for CHF, sarcoidosis, nonischemic cardiomyopathy, atrial fibrillation, nonsustained V. Tach, status post automatic implantable cardiac defibrillator in situ. GERD, hypercholesterolemia, chronic kidney disease stage III, pacemaker, angina, gout, hypertension, elevated PSA, myocardial infarction, headache, and neuropathies. He has been seen at [facility] and felt not to be a transplant candidate due to renal failure and lack of family support. He has depression and anxiety and has been using Xanax when necessary but feels it is not helping he uses sertraline daily. He feels that his living situation is very stressful for him. He was recently in the emergency room December 1 with complaints of chest pain. Troponins were negative. He has an upcoming appointment with [facility] pain [facility]. On January 16. He is currently on milrinone. When in the emergency room on December 1 he was complaining of chest and neck discomfort. He has been turned away from his pain  clinic because he broke the pain contract. He has been referred to a new pain specialist as noted above. He remains on Winifred Olive. He has not noted any bright red blood per rectum or melena.  He is here today with ongoing complaints of intermittent epigastric pain. It is not alleviated nor exacerbated with ingestion of food. He describes it as a burning discomfort. He does have intermittent heartburn. He has tried Prilosec, Protonix, and Prevacid which she says provided no relief. He has tried Carafate which provides no relief. He reports that he feels as if solid foods are getting stuck in the bottom of his esophagus he has to eat slowly and let the food pass. He has not had to spit his food up. He reports that several times per week if he takes a sip of the very cold drink, he will feel as if he has a lump in his throat. He feels full after 1 or 2 bites of food and feels as everything sits in his stomach like a lump of lead and he gets very bloated. He is moving his bowels every other day and they are soft formed to firm with no diarrhea.   Past Medical History  Diagnosis Date  . CHF (congestive heart failure) ([place])   . Sarcoidosis ([place])   . Cardiomyopathy, dilated, nonischemic ([place])     non ischemic by cath  . Acute on chronic systolic heart failure ([place])   . Automatic implantable cardiac defibrillator in situ   . Atrial fibrillation ([place])   . NSVT (nonsustained ventricular tachycardia) ([place])   . GERD (gastroesophageal reflux disease)   .  Hypercholesteremia   . Shortness of breath   . Chronic kidney disease (CKD), stage III (moderate)   . Pacemaker   . Anginal pain ([place])   . Gout   . Hypertension     dr [name]  . Coronary artery disease   . Elevated PSA 06/24/2013  . Myocardial infarction ([place])   . Headache   . Neuropathy [facility])     Past Surgical History  Procedure Laterality Date  . Pacemaker insertion  2009    with ICD  . Tee without cardioversion  01/17/2011    Procedure:  TRANSESOPHAGEAL ECHOCARDIOGRAM (TEE);  Surgeon: Birdie Riddle, MD;  Location: [place];  Service: Cardiovascular;  Laterality: N/A;  . Cardioversion  01/17/2011    Procedure: CARDIOVERSION;  Surgeon: Birdie Riddle, MD;  Location: [place];  Service: Cardiovascular;  Laterality: N/A;  . Cardiac catheterization    . Insert / replace / remove pacemaker    . Anterior cervical decomp/discectomy fusion  08/21/2011    Procedure: ANTERIOR CERVICAL DECOMPRESSION/DISCECTOMY FUSION 2 LEVELS;  Surgeon: Marybelle Killings, MD;  Location: [place];  Service: Orthopedics;  Laterality: N/A;  C5-6, C6-7 Anterior Cervical Discectomy and Fusion, allograft, plate  . Tee without cardioversion N/A 02/16/2012    Procedure: TRANSESOPHAGEAL ECHOCARDIOGRAM (TEE);  Surgeon: Jolaine Artist, MD;  Location: [facility] ENDOSCOPY;  Service: Cardiovascular;  Laterality: N/A;  original case scheduled under his dad (who is deceased), rescheduled under correct mrn/pt/dob. [place]/dl  . Tee without cardioversion N/A 03/22/2012    Procedure: TRANSESOPHAGEAL ECHOCARDIOGRAM (TEE);  Surgeon: Lelon Perla, MD;  Location: [place];  Service: Cardiovascular;  Laterality: N/A;  . Cardioversion N/A 03/22/2012    Procedure: CARDIOVERSION;  Surgeon: Lelon Perla, MD;  Location: [facility] ENDOSCOPY;  Service: Cardiovascular;  Laterality: N/A;  . Cardioversion N/A 04/26/2012    Procedure: CARDIOVERSION;  Surgeon: Jolaine Artist, MD;  Location: [facility] ENDOSCOPY;  Service: Cardiovascular;  Laterality: N/A;  . Central venous catheter tunneled insertion single lumen  09/16/2012    right IJ  . Cardioversion N/A 11/08/2012    Procedure: CARDIOVERSION;  Surgeon: Jolaine Artist, MD;  Location: [facility] ENDOSCOPY;  Service: Cardiovascular;  Laterality: N/A;  . Back surgery  1987, lower    Ruptured disk repair  . Peripherally inserted central catheter insertion  2013  . Right heart catheterization N/A 01/26/2012    Procedure: RIGHT HEART CATH;  Surgeon: Jolaine Artist, MD;  Location: [facility] CATH LAB;  Service: Cardiovascular;  Laterality: N/A;  . Right heart catheterization N/A 04/30/2013    Procedure: RIGHT HEART CATH;  Surgeon: Jolaine Artist, MD;  Location: [facility] CATH LAB;  Service: Cardiovascular;  Laterality: N/A;   Family History  Problem Relation Age of Onset  . Heart disease    . Heart failure    . Stroke    . Anesthesia problems Neg Hx   . Hypotension Neg Hx   . Malignant hyperthermia Neg Hx   . Pseudochol deficiency Neg Hx   . Heart failure Sister   . Heart attack Sister   . Hyperlipidemia Mother    Social History  Substance Use Topics  . Smoking status: Never Smoker   . Smokeless tobacco: Never Used  . Alcohol Use: No   Current Outpatient Prescriptions  Medication Sig Dispense Refill  . allopurinol (ZYLOPRIM) 100 MG tablet Take 1 tablet (100 mg total) by mouth daily. 30 tablet 6  . ALPRAZolam (XANAX) 0.5 MG tablet Take 1 tablet (0.5 mg total) by mouth  3 (three) times daily as needed for anxiety. 60 tablet 0  . apixaban (ELIQUIS) 5 MG TABS tablet Take 1 tablet (5 mg total) by mouth 2 (two) times daily. 60 tablet 3  . atorvastatin (LIPITOR) 80 MG tablet Take 1 tablet (80 mg total) by mouth daily. 30 tablet 3  . carvedilol (COREG) 6.25 MG tablet TAKE 1 TABLET (6.25 MG TOTAL) BY MOUTH 2 (TWO) TIMES DAILY WITH A MEAL. 60 tablet 3  . diphenhydrAMINE (BENADRYL) 50 MG capsule Take 50 mg by mouth daily as needed for itching.    Marland Kitchen eplerenone (INSPRA) 25 MG tablet Take 12.5 mg by mouth every other day.     . fluticasone (FLONASE) 50 MCG/ACT nasal spray Place 2 sprays into both nostrils daily as needed for allergies. 16 g 2  . gabapentin (NEURONTIN) 300 MG capsule Take 300 mg by mouth at bedtime.    . metolazone (ZAROXOLYN) 2.5 MG tablet Take 1 tablet (2.5 mg total) by mouth daily as needed (for fluid retention). 10 tablet 6  . milrinone (PRIMACOR) 20 MG/100ML SOLN infusion Inject 0.375 mcg/kg/min into the vein continuous. 440mg  in 482ml  infusion 2.4 ml per hour    . oxyCODONE-acetaminophen (PERCOCET) 10-325 MG tablet Take 1 tablet by mouth every 6 (six) hours as needed for pain.     . potassium chloride 20 MEQ TBCR Take 40 mEq by mouth 2 (two) times daily. 60 tablet 3  . ranitidine (ZANTAC) 150 MG capsule Take 150 mg by mouth daily as needed for heartburn (reflux).    Marland Kitchen senna-docusate (SENOKOT-S) 8.6-50 MG per tablet Take 1-2 tablets by mouth 2 (two) times daily as needed for mild constipation or moderate constipation. 60 tablet 3  . sertraline (ZOLOFT) 100 MG tablet Take 1 tablet (100 mg total) by mouth daily. 90 tablet 1  . torsemide (DEMADEX) 20 MG tablet Take 80 mg by mouth daily.    Marland Kitchen zolpidem (AMBIEN) 5 MG tablet TAKE 1 AND 1/2 TABLET BY MOUTH EVERY DAY AT BEDTIME 45 tablet 0   No current facility-administered medications for this visit.   Allergies  Allergen Reactions  . Nitroglycerin Other (See Comments)    "feels like head is going to bust open"  . Colchicine Nausea Only     Review of Systems: Per history of present illness otherwise negative.  LAB RESULTS: CBC 12/03/2014 showed WBC 5.8, hemoglobin 14, hematocrit 42.4, platelets 244,000. Comprehensive metabolic panel 99991111 total bili 0.6, alkaline phosphatase 77, AST 29, ALT 22. Basic metabolic panel 99991111 BUN 28, creatinine 1.82, GFR 44. Studies:   Dg Chest 2 View  12/03/2014  CLINICAL DATA:  Chest pain EXAM: CHEST  2 VIEW COMPARISON:  07/27/2014 FINDINGS: Cardiac enlargement. AICD unchanged in position. Negative for heart failure. Lungs are clear without infiltrate or effusion Right jugular central venous catheter tip in the SVC is unchanged from the prior study IMPRESSION: No active cardiopulmonary disease. Electronically Signed   By: Franchot Gallo M.D.   On: 12/03/2014 14:32     Physical Exam: BP 130/88 mmHg  Pulse 77  Ht 6\' 4"  (1.93 m)  Wt 229 lb (103.874 kg)  BMI 27.89 kg/m2  SpO2 98% General: Pleasant, well developed ,  African-American male in no acute distress Head: Normocephalic and atraumatic Eyes:  sclerae anicteric, conjunctiva pink  Ears: Normal auditory acuity Lungs: Clear throughout to auscultation Heart: Regular rate and rhythm Abdomen: Soft, non distended, non-tender. No masses, no hepatomegaly. Normal bowel sounds Musculoskeletal: Symmetrical with no gross deformities  Extremities:  No edema  Neurological: Alert oriented x 4, grossly nonfocal Psychological:  Alert and cooperative. Normal mood and affect  Assessment and Recommendations: 64 year old male with severe CHF and chronic kidney disease presenting with chronic epigastric discomfort. He has had improvement in the past with his H2 blocker but he states they are no longer working. In addition. He now feels as if his food is getting stuck and he feels full sooner than normal. An antireflux regimen has been reviewed. He will be given a trial of Dexilant 60 mg 1 by mouth every morning 30 minutes prior to breakfast. He will also be given a trial of Zofran 4 mg by mouth every 8 hours when necessary nausea. Gastric emptying scan will be obtained to evaluate for possible gastroparesis, and a barium swallow with tablet will be obtained to evaluate for possible stricture or dysmotility. He will follow up 2-3 weeks after completion of the above.        Rexton Greulich, Deloris Ping 12/24/2014,   Rec notes from [facility]. Pt had EGD 06/30/10--nl upper endoscopy. There were 3 benign appearing yellowish small nodules noted. (these had been biopsied in 2010 and were xanthelasmas--benign.)

## 2014-12-25 ENCOUNTER — Other Ambulatory Visit (HOSPITAL_COMMUNITY): Payer: Self-pay | Admitting: Internal Medicine

## 2014-12-29 ENCOUNTER — Telehealth (HOSPITAL_COMMUNITY): Payer: Self-pay | Admitting: *Deleted

## 2014-12-29 NOTE — Telephone Encounter (Signed)
Received mess from [place] at [facility], pt has gone to [facility] from 12/25-1/10 and they will get labs when he returns

## 2014-12-30 DIAGNOSIS — R4781 Slurred speech: Secondary | ICD-10-CM | POA: Diagnosis not present

## 2014-12-30 DIAGNOSIS — I110 Hypertensive heart disease with heart failure: Secondary | ICD-10-CM | POA: Diagnosis not present

## 2014-12-30 DIAGNOSIS — Z79899 Other long term (current) drug therapy: Secondary | ICD-10-CM | POA: Diagnosis not present

## 2014-12-30 DIAGNOSIS — I69954 Hemiplegia and hemiparesis following unspecified cerebrovascular disease affecting left non-dominant side: Secondary | ICD-10-CM | POA: Diagnosis not present

## 2014-12-30 DIAGNOSIS — Z95810 Presence of automatic (implantable) cardiac defibrillator: Secondary | ICD-10-CM | POA: Diagnosis not present

## 2014-12-30 DIAGNOSIS — I639 Cerebral infarction, unspecified: Secondary | ICD-10-CM | POA: Diagnosis not present

## 2014-12-30 DIAGNOSIS — R1010 Upper abdominal pain, unspecified: Secondary | ICD-10-CM | POA: Diagnosis not present

## 2014-12-30 DIAGNOSIS — N3289 Other specified disorders of bladder: Secondary | ICD-10-CM | POA: Diagnosis not present

## 2014-12-30 DIAGNOSIS — I2510 Atherosclerotic heart disease of native coronary artery without angina pectoris: Secondary | ICD-10-CM | POA: Diagnosis not present

## 2014-12-30 DIAGNOSIS — I509 Heart failure, unspecified: Secondary | ICD-10-CM | POA: Diagnosis not present

## 2014-12-30 DIAGNOSIS — M6281 Muscle weakness (generalized): Secondary | ICD-10-CM | POA: Diagnosis not present

## 2014-12-30 DIAGNOSIS — I480 Paroxysmal atrial fibrillation: Secondary | ICD-10-CM | POA: Diagnosis not present

## 2014-12-30 DIAGNOSIS — Z950 Presence of cardiac pacemaker: Secondary | ICD-10-CM | POA: Diagnosis not present

## 2014-12-30 DIAGNOSIS — G8929 Other chronic pain: Secondary | ICD-10-CM | POA: Diagnosis not present

## 2014-12-30 DIAGNOSIS — R1013 Epigastric pain: Secondary | ICD-10-CM | POA: Diagnosis not present

## 2014-12-30 DIAGNOSIS — K760 Fatty (change of) liver, not elsewhere classified: Secondary | ICD-10-CM | POA: Diagnosis not present

## 2014-12-30 DIAGNOSIS — K5900 Constipation, unspecified: Secondary | ICD-10-CM | POA: Diagnosis not present

## 2014-12-30 DIAGNOSIS — Z7901 Long term (current) use of anticoagulants: Secondary | ICD-10-CM | POA: Diagnosis not present

## 2014-12-30 DIAGNOSIS — R7989 Other specified abnormal findings of blood chemistry: Secondary | ICD-10-CM | POA: Diagnosis not present

## 2014-12-30 DIAGNOSIS — I517 Cardiomegaly: Secondary | ICD-10-CM | POA: Diagnosis not present

## 2014-12-30 DIAGNOSIS — I4891 Unspecified atrial fibrillation: Secondary | ICD-10-CM | POA: Diagnosis not present

## 2014-12-31 DIAGNOSIS — R0602 Shortness of breath: Secondary | ICD-10-CM | POA: Diagnosis not present

## 2014-12-31 DIAGNOSIS — I509 Heart failure, unspecified: Secondary | ICD-10-CM | POA: Diagnosis not present

## 2015-01-01 ENCOUNTER — Other Ambulatory Visit (HOSPITAL_COMMUNITY): Payer: Self-pay | Admitting: Internal Medicine

## 2015-01-01 ENCOUNTER — Ambulatory Visit: Payer: Medicare Other | Admitting: Family

## 2015-01-03 DIAGNOSIS — R1013 Epigastric pain: Secondary | ICD-10-CM | POA: Diagnosis not present

## 2015-01-04 ENCOUNTER — Other Ambulatory Visit: Payer: Self-pay | Admitting: Family

## 2015-01-06 ENCOUNTER — Telehealth: Payer: Self-pay | Admitting: Gastroenterology

## 2015-01-06 DIAGNOSIS — N184 Chronic kidney disease, stage 4 (severe): Secondary | ICD-10-CM | POA: Diagnosis not present

## 2015-01-06 DIAGNOSIS — Z5181 Encounter for therapeutic drug level monitoring: Secondary | ICD-10-CM | POA: Diagnosis not present

## 2015-01-06 DIAGNOSIS — I480 Paroxysmal atrial fibrillation: Secondary | ICD-10-CM | POA: Diagnosis not present

## 2015-01-06 DIAGNOSIS — I129 Hypertensive chronic kidney disease with stage 1 through stage 4 chronic kidney disease, or unspecified chronic kidney disease: Secondary | ICD-10-CM | POA: Diagnosis not present

## 2015-01-06 DIAGNOSIS — Z9981 Dependence on supplemental oxygen: Secondary | ICD-10-CM | POA: Diagnosis not present

## 2015-01-06 DIAGNOSIS — Z79899 Other long term (current) drug therapy: Secondary | ICD-10-CM | POA: Diagnosis not present

## 2015-01-06 DIAGNOSIS — Z452 Encounter for adjustment and management of vascular access device: Secondary | ICD-10-CM | POA: Diagnosis not present

## 2015-01-06 DIAGNOSIS — Z7901 Long term (current) use of anticoagulants: Secondary | ICD-10-CM | POA: Diagnosis not present

## 2015-01-06 DIAGNOSIS — Z950 Presence of cardiac pacemaker: Secondary | ICD-10-CM | POA: Diagnosis not present

## 2015-01-06 DIAGNOSIS — I5022 Chronic systolic (congestive) heart failure: Secondary | ICD-10-CM | POA: Diagnosis not present

## 2015-01-06 NOTE — Telephone Encounter (Signed)
[name] states pt is having bad epigastric pain and pressure making him feel SOB. Pt does have cardiac issues. He is having vomiting and is unable to keep anything down including his meds. Discussed with her that the pt should go to the ER to be evaluated because he has to keep his meds down. She verbalized understanding.

## 2015-01-06 NOTE — Telephone Encounter (Signed)
[name] from [place] is calling in for patient. she is at his home now. Chong Sicilian states that patient is complaining of sever epigastric pain, SOB, she says that pt has been vomiting and cannot eat/drink anything. Chong Sicilian states that patient has a heart condition and cannot keep medicine down.

## 2015-01-06 NOTE — Telephone Encounter (Signed)
Left message for Patty to call back.

## 2015-01-07 ENCOUNTER — Telehealth: Payer: Self-pay | Admitting: Medical

## 2015-01-07 ENCOUNTER — Ambulatory Visit (INDEPENDENT_AMBULATORY_CARE_PROVIDER_SITE_OTHER): Payer: Medicare Other | Admitting: Medical

## 2015-01-07 ENCOUNTER — Encounter: Payer: Self-pay | Admitting: Medical

## 2015-01-07 ENCOUNTER — Telehealth: Payer: Self-pay | Admitting: Family

## 2015-01-07 VITALS — BP 130/86 | HR 60 | Temp 98.1°F | Ht 76.0 in | Wt 233.6 lb

## 2015-01-07 DIAGNOSIS — I509 Heart failure, unspecified: Secondary | ICD-10-CM | POA: Diagnosis not present

## 2015-01-07 DIAGNOSIS — R109 Unspecified abdominal pain: Secondary | ICD-10-CM | POA: Diagnosis not present

## 2015-01-07 DIAGNOSIS — R112 Nausea with vomiting, unspecified: Secondary | ICD-10-CM | POA: Diagnosis not present

## 2015-01-07 DIAGNOSIS — R0602 Shortness of breath: Secondary | ICD-10-CM | POA: Diagnosis not present

## 2015-01-07 DIAGNOSIS — I5022 Chronic systolic (congestive) heart failure: Secondary | ICD-10-CM | POA: Diagnosis not present

## 2015-01-07 MED ORDER — RANITIDINE HCL 150 MG PO CAPS
150.0000 mg | ORAL_CAPSULE | Freq: Two times a day (BID) | ORAL | Status: AC
Start: 2015-01-07 — End: ?

## 2015-01-07 NOTE — Progress Notes (Signed)
Pre visit review using our clinic review tool, if applicable. No additional management support is needed unless otherwise documented below in the visit note. 

## 2015-01-07 NOTE — Patient Instructions (Addendum)
Continue current med regimen.  Rx ranitidine.  Add otc ginger tab with each meal.   Hydrate well tonight.(propel fitness water good option)  If your symptoms worsen tonight then got to ED.  Cbc, cmp, amylase, and lipase pendning. I asked MA to designate stat so we can get results tomorrow am  I will send referral message to  GI office to see if they can see you next week and move studies up.  I want you to get US abdomen at 10:30 tomorrow am. Nothing to eat or drink 6 hours before study.  Follow up this coming Monday with me provided no Gi appointment available.

## 2015-01-07 NOTE — Progress Notes (Signed)
Subjective:    Patient ID: [name], male    DOB: 15-Jan-1950, 65 y.o.   MRN: [id number]  HPI  Pt in for some epigastric pain recently. Pt states some similar symptoms since 2012. Pt states recently flare of pain but he has constant daily pain to some degree even before this past week. He states he feels that when he eats will get food stuck. Also after eating has some epigastric pain and some pain that radiates to his rt upper abdomen.  Pt takes delixant in the morning. Pt sees GI. He is scheduled to have procedure on January 13, 2014. Pt tried to call gi office and be seen before 12 th.  Pt thinks he may get procedure to dilate his esophagus.   Most recent note by  Gi PA-C reviewed today.  Appears pt may get gastric emptying scan on the 12th to evaluate for gasroparesis and barium swollow to look for stricture of dysmotility.  Pt pain presently has moderate pain. When he eats pain is a lot worse and will vomit. Describes regurgitation with each meal but keeping fluids down. He hydrates with propel  Pt in 2015 was scheduled for egd and colonoscopy. But test canceled due to high ventricle rate with afib.   Below section accidentally placed and tried to delete but could not.  End of section accidentally placed. Past Medical History  Diagnosis Date  . CHF (congestive heart failure) ([place])   . Sarcoidosis ([place])   . Cardiomyopathy, dilated,  nonischemic ([place])     non ischemic by cath  . Acute on chronic systolic heart failure ([place])   . Automatic implantable cardiac defibrillator in situ   . Atrial fibrillation ([place])   . NSVT (nonsustained ventricular tachycardia) ([place])   . GERD (gastroesophageal reflux disease)   . Hypercholesteremia   . Shortness of breath   . Chronic kidney disease (CKD), stage III (moderate)   . Pacemaker   . Anginal pain ([place])   . Gout   . Hypertension     dr [name]  . Coronary artery disease   . Elevated PSA 06/24/2013  . Myocardial infarction ([place])   . Headache   . Neuropathy [facility])     Social History   Social History  . Marital Status: Single    Spouse Name: N/A  . Number of Children: N/A  . Years of Education: N/A   Occupational History  . Not on file.   Social History Main Topics  . Smoking status: Never Smoker   . Smokeless tobacco: Never Used  . Alcohol Use: No  . Drug Use: No     Comment: last use august 2004  . Sexual Activity: Not Currently   Other Topics Concern  . Not on file   Social History Narrative   3 sons- one in [place], 1 in [place], on in [place]   Retired from post office- on disability   Completed Bachelors from [employer]    Past Surgical History  Procedure Laterality Date  . Pacemaker insertion  2009    with ICD  . Tee without cardioversion  01/17/2011    Procedure: TRANSESOPHAGEAL ECHOCARDIOGRAM (TEE);  Surgeon: Birdie Riddle, MD;  Location: [place];  Service: Cardiovascular;  Laterality: N/A;  . Cardioversion  01/17/2011    Procedure: CARDIOVERSION;  Surgeon: Birdie Riddle, MD;  Location: [place];  Service: Cardiovascular;  Laterality: N/A;  . Cardiac catheterization    . Insert / replace / remove pacemaker    . Anterior cervical decomp/discectomy fusion  08/21/2011    Procedure: ANTERIOR CERVICAL DECOMPRESSION/DISCECTOMY FUSION 2 LEVELS;  Surgeon: Marybelle Killings, MD;  Location: [place];  Service: Orthopedics;  Laterality: N/A;  C5-6, C6-7 Anterior Cervical  Discectomy and Fusion, allograft, plate  . Tee without cardioversion N/A 02/16/2012    Procedure: TRANSESOPHAGEAL ECHOCARDIOGRAM (TEE);  Surgeon: Jolaine Artist, MD;  Location: [facility] ENDOSCOPY;  Service: Cardiovascular;  Laterality: N/A;  original case scheduled under his dad (who is deceased), rescheduled under correct mrn/pt/dob. [place]/dl  . Tee without cardioversion N/A 03/22/2012    Procedure: TRANSESOPHAGEAL ECHOCARDIOGRAM (TEE);  Surgeon: Lelon Perla, MD;  Location: [place];  Service: Cardiovascular;  Laterality: N/A;  . Cardioversion N/A 03/22/2012    Procedure: CARDIOVERSION;  Surgeon: Lelon Perla, MD;  Location: [facility] ENDOSCOPY;  Service: Cardiovascular;  Laterality: N/A;  . Cardioversion N/A 04/26/2012    Procedure: CARDIOVERSION;  Surgeon: Jolaine Artist, MD;  Location: [facility] ENDOSCOPY;  Service: Cardiovascular;  Laterality: N/A;  . Central venous catheter tunneled insertion single lumen  09/16/2012    right IJ  . Cardioversion N/A 11/08/2012    Procedure: CARDIOVERSION;  Surgeon: Jolaine Artist, MD;  Location: [facility] ENDOSCOPY;  Service: Cardiovascular;  Laterality: N/A;  . Back surgery  1987, lower    Ruptured disk repair  . Peripherally  inserted central catheter insertion  2013  . Right heart catheterization N/A 01/26/2012    Procedure: RIGHT HEART CATH;  Surgeon: Jolaine Artist, MD;  Location: [facility] CATH LAB;  Service: Cardiovascular;  Laterality: N/A;  . Right heart catheterization N/A 04/30/2013    Procedure: RIGHT HEART CATH;  Surgeon: Jolaine Artist, MD;  Location: [facility] CATH LAB;  Service: Cardiovascular;  Laterality: N/A;    Family History  Problem Relation Age of Onset  . Heart disease    . Heart failure    . Stroke    . Anesthesia problems Neg Hx   . Hypotension Neg Hx   . Malignant hyperthermia Neg Hx   . Pseudochol deficiency Neg Hx   . Heart failure Sister   . Heart attack Sister   . Hyperlipidemia Mother     Allergies  Allergen Reactions  .  Nitroglycerin Other (See Comments)    "feels like head is going to bust open"  . Colchicine Nausea Only    Current Outpatient Prescriptions on File Prior to Visit  Medication Sig Dispense Refill  . allopurinol (ZYLOPRIM) 100 MG tablet Take 1 tablet (100 mg total) by mouth daily. 30 tablet 6  . ALPRAZolam (XANAX) 0.5 MG tablet Take 1 tablet (0.5 mg total) by mouth 3 (three) times daily as needed for anxiety. 60 tablet 0  . apixaban (ELIQUIS) 5 MG TABS tablet Take 1 tablet (5 mg total) by mouth 2 (two) times daily. 60 tablet 3  . atorvastatin (LIPITOR) 80 MG tablet Take 1 tablet (80 mg total) by mouth daily. 30 tablet 3  . carvedilol (COREG) 6.25 MG tablet TAKE 1 TABLET (6.25 MG TOTAL) BY MOUTH 2 (TWO) TIMES DAILY WITH A MEAL. 60 tablet 3  . dexlansoprazole (DEXILANT) 60 MG capsule Take 1 capsule (60 mg total) by mouth daily. 30 min prior to breakfast 30 capsule 3  . diphenhydrAMINE (BENADRYL) 50 MG capsule Take 50 mg by mouth daily as needed for itching.    Marland Kitchen eplerenone (INSPRA) 25 MG tablet Take 12.5 mg by mouth every other day.     . fluticasone (FLONASE) 50 MCG/ACT nasal spray PLACE 2 SPRAYS INTO BOTH NOSTRILS DAILY AS NEEDED FOR ALLERGIES. 16 g 5  . gabapentin (NEURONTIN) 300 MG capsule Take 300 mg by mouth at bedtime.    . metolazone (ZAROXOLYN) 2.5 MG tablet Take 1 tablet (2.5 mg total) by mouth daily as needed (for fluid retention). 10 tablet 6  . milrinone (PRIMACOR) 20 MG/100ML SOLN infusion Inject 0.375 mcg/kg/min into the vein continuous. 440mg  in 416ml infusion 2.4 ml per hour    . ondansetron (ZOFRAN) 4 MG tablet Take 1 tablet (4 mg total) by mouth every 8 (eight) hours as needed for nausea or vomiting. 30 tablet 1  . oxyCODONE-acetaminophen (PERCOCET) 10-325 MG tablet Take 1 tablet by mouth every 6 (six) hours as needed for pain.     . potassium chloride 20 MEQ TBCR Take 40 mEq by mouth 2 (two) times daily. 60 tablet 3  . ranitidine (ZANTAC) 150 MG capsule Take 150 mg by mouth  daily as needed for heartburn (reflux).    Marland Kitchen senna-docusate (SENOKOT-S) 8.6-50 MG per tablet Take 1-2 tablets by mouth 2 (two) times daily as needed for mild constipation or moderate constipation. 60 tablet 3  . sertraline (ZOLOFT) 100 MG tablet Take 1 tablet (100 mg total) by mouth daily. 90 tablet 1  . torsemide (DEMADEX) 20 MG tablet Take 80 mg by mouth daily.    Marland Kitchen  zolpidem (AMBIEN) 5 MG tablet TAKE 1 AND 1/2 TABLET BY MOUTH EVERY DAY AT BEDTIME 45 tablet 0   No current facility-administered medications on file prior to visit.    BP 130/86 mmHg  Pulse 60  Temp(Src) 98.1 F (36.7 C) (Oral)  Ht 6\' 4"  (1.93 m)  Wt 233 lb 9.6 oz (105.96 kg)  BMI 28.45 kg/m2  SpO2 97%     Review of Systems  Constitutional: Negative for fever, chills and fatigue.  Respiratory: Negative for cough, chest tightness, shortness of breath and wheezing.   Cardiovascular: Negative for chest pain and palpitations.  Gastrointestinal: Positive for vomiting and abdominal pain. Negative for nausea, diarrhea, constipation, blood in stool, abdominal distention, anal bleeding and rectal pain.  Musculoskeletal: Negative for back pain.  Neurological: Negative for dizziness and headaches.  Hematological: Negative for adenopathy. Does not bruise/bleed easily.       Objective:   Physical Exam  General Appearance- Not in acute distress.  HEENT Eyes- Scleraeral/Conjuntiva-bilat- Not Yellow. Mouth & Throat- Normal.  Chest and Lung Exam Auscultation: Breath sounds:-Normal. Adventitious sounds:- No Adventitious sounds.  Cardiovascular Auscultation:Rythm - Regular. Heart Sounds -Normal heart sounds.  Abdomen Inspection:-Inspection Normal.  Palpation/Perucssion: Palpation and Percussion of the abdomen reveal- mild- moderate epigasricTender and ruq pain, No Rebound tenderness, No rigidity(Guarding) and No Palpable abdominal masses.  Liver:-Normal.  Spleen:- Normal.   Skin- no tenting. Moist mucous  membranes.  Back- no cva tenderness       Assessment & Plan:  Continue current med regimen.  Rx ranitidine.(discussed tx with Dr. Charlett Blake) Add otc ginger tab with each meal.   Hydrate well tonight.(propel fitness water good option)  If your symptoms worsen tonight then got to ED.  Cbc, cmp, amylase, and lipase pendning. I asked MA to designate stat so we can get results tomorrow am  I will send referral message to  GI office to see if they can see you next week and move studies up.  I want you to get US abdomen at 10:30 tomorrow am. Nothing to eat or drink 6 hours before study.  Follow up this coming Monday with me provided no Gi appointment available.

## 2015-01-07 NOTE — Telephone Encounter (Signed)
error:[id number] ° °

## 2015-01-07 NOTE — Telephone Encounter (Signed)
Labs drawn on 5th need to go out stat on 6th in early am. Very important.

## 2015-01-08 ENCOUNTER — Ambulatory Visit (HOSPITAL_BASED_OUTPATIENT_CLINIC_OR_DEPARTMENT_OTHER)
Admission: RE | Admit: 2015-01-08 | Discharge: 2015-01-08 | Disposition: A | Discharge status: Home / Self Care | Payer: Medicare Other | Source: Ambulatory Visit | Attending: Medical | Admitting: Medical

## 2015-01-08 DIAGNOSIS — R109 Unspecified abdominal pain: Secondary | ICD-10-CM | POA: Diagnosis not present

## 2015-01-08 DIAGNOSIS — R932 Abnormal findings on diagnostic imaging of liver and biliary tract: Secondary | ICD-10-CM | POA: Insufficient documentation

## 2015-01-08 DIAGNOSIS — R1013 Epigastric pain: Secondary | ICD-10-CM | POA: Diagnosis not present

## 2015-01-08 LAB — CBC WITH DIFFERENTIAL/PLATELET
Basophils Absolute: 0 10*3/uL (ref 0.0–0.1)
Basophils Relative: 0.5 % (ref 0.0–3.0)
EOS PCT: 1.7 % (ref 0.0–5.0)
Eosinophils Absolute: 0.1 10*3/uL (ref 0.0–0.7)
HEMATOCRIT: 38.3 % — AB (ref 39.0–52.0)
HEMOGLOBIN: 12.6 g/dL — AB (ref 13.0–17.0)
LYMPHS PCT: 21 % (ref 12.0–46.0)
Lymphs Abs: 1.4 10*3/uL (ref 0.7–4.0)
MCHC: 32.9 g/dL (ref 30.0–36.0)
MCV: 88.5 fl (ref 78.0–100.0)
MONOS PCT: 7.9 % (ref 3.0–12.0)
Monocytes Absolute: 0.5 10*3/uL (ref 0.1–1.0)
Neutro Abs: 4.7 10*3/uL (ref 1.4–7.7)
Neutrophils Relative %: 68.9 % (ref 43.0–77.0)
Platelets: 259 10*3/uL (ref 150.0–400.0)
RBC: 4.33 Mil/uL (ref 4.22–5.81)
RDW: 14.8 % (ref 11.5–15.5)
WBC: 6.8 10*3/uL (ref 4.0–10.5)

## 2015-01-08 LAB — COMPREHENSIVE METABOLIC PANEL
ALBUMIN: 3.2 g/dL — AB (ref 3.5–5.2)
ALT: 42 U/L (ref 0–53)
AST: 49 U/L — AB (ref 0–37)
Alkaline Phosphatase: 81 U/L (ref 39–117)
BILIRUBIN TOTAL: 0.7 mg/dL (ref 0.2–1.2)
BUN: 19 mg/dL (ref 6–23)
CALCIUM: 9.9 mg/dL (ref 8.4–10.5)
CO2: 25 meq/L (ref 19–32)
CREATININE: 1.49 mg/dL (ref 0.40–1.50)
Chloride: 106 mEq/L (ref 96–112)
GFR: 60.87 mL/min (ref >60.00)
Glucose, Bld: 84 mg/dL (ref 70–99)
Potassium: 4 mEq/L (ref 3.5–5.1)
Sodium: 139 mEq/L (ref 135–145)
TOTAL PROTEIN: 7.4 g/dL (ref 6.0–8.3)

## 2015-01-08 LAB — AMYLASE: Amylase: 60 U/L (ref 27–131)

## 2015-01-08 LAB — LIPASE: LIPASE: 26 U/L (ref 11.0–59.0)

## 2015-01-11 ENCOUNTER — Ambulatory Visit: Payer: Medicare Other | Admitting: Medical

## 2015-01-13 ENCOUNTER — Encounter: Payer: Self-pay | Admitting: Medical

## 2015-01-13 ENCOUNTER — Ambulatory Visit (INDEPENDENT_AMBULATORY_CARE_PROVIDER_SITE_OTHER): Payer: Medicare Other | Admitting: Medical

## 2015-01-13 VITALS — BP 110/80 | HR 100 | Temp 97.6°F | Ht 76.0 in | Wt 237.2 lb

## 2015-01-13 DIAGNOSIS — R112 Nausea with vomiting, unspecified: Secondary | ICD-10-CM | POA: Diagnosis not present

## 2015-01-13 DIAGNOSIS — R109 Unspecified abdominal pain: Secondary | ICD-10-CM | POA: Diagnosis not present

## 2015-01-13 LAB — CBC WITH DIFFERENTIAL/PLATELET
BASOS ABS: 0 10*3/uL (ref 0.0–0.1)
Basophils Relative: 0.5 % (ref 0.0–3.0)
EOS PCT: 1.2 % (ref 0.0–5.0)
Eosinophils Absolute: 0.1 10*3/uL (ref 0.0–0.7)
HEMATOCRIT: 35.9 % — AB (ref 39.0–52.0)
Hemoglobin: 11.7 g/dL — ABNORMAL LOW (ref 13.0–17.0)
LYMPHS ABS: 1.5 10*3/uL (ref 0.7–4.0)
LYMPHS PCT: 17.2 % (ref 12.0–46.0)
MCHC: 32.5 g/dL (ref 30.0–36.0)
MCV: 88.6 fl (ref 78.0–100.0)
MONOS PCT: 9.2 % (ref 3.0–12.0)
Monocytes Absolute: 0.8 10*3/uL (ref 0.1–1.0)
NEUTROS PCT: 71.9 % (ref 43.0–77.0)
Neutro Abs: 6.5 10*3/uL (ref 1.4–7.7)
Platelets: 298 10*3/uL (ref 150.0–400.0)
RBC: 4.06 Mil/uL — AB (ref 4.22–5.81)
RDW: 14.7 % (ref 11.5–15.5)
WBC: 9 10*3/uL (ref 4.0–10.5)

## 2015-01-13 NOTE — Progress Notes (Signed)
Subjective:    Patient ID: [name], male    DOB: 1950/01/20, 65 y.o.   MRN: [id number]  HPI   Pt in with no improvement of his chronic conditions. Pt has procedures scheduled for tomorrow. He will see Gi office tomorrow.  Please see last note. I had tried to get him in sooner with GI.  Pt labs looked good the other day. His hb/hct had dropped since prior cbc.  Pt abdomen did not show any acute abnormality.   Pt still feels like he can't swollow foods and if he does swallow foods will regurgitate foods up.  We added zantac otc last visit and ginger.   Pt states zofran tablets did help.   Review of Systems  Constitutional: Negative for fever, chills and fatigue.  Respiratory: Negative for cough, shortness of breath and wheezing.   Cardiovascular: Negative for chest pain and palpitations.  Gastrointestinal: Positive for nausea, vomiting and abdominal pain. Negative for diarrhea, constipation, blood in stool, abdominal distention, anal bleeding and rectal pain.  Genitourinary: Negative for dysuria and flank pain.  Musculoskeletal: Negative for back pain.  Skin: Negative for rash.  Neurological: Negative for dizziness, syncope, weakness and headaches.  Hematological: Negative for adenopathy. Does not bruise/bleed easily.  Psychiatric/Behavioral: Negative for behavioral problems and confusion.     Past Medical History  Diagnosis Date  . CHF (congestive heart failure) ([place])   . Sarcoidosis ([place])   . Cardiomyopathy, dilated, nonischemic ([place])     non ischemic by cath  . Acute on chronic systolic heart failure ([place])   . Automatic implantable cardiac defibrillator in situ   . Atrial fibrillation ([place])   . NSVT (nonsustained ventricular tachycardia) ([place])   . GERD (gastroesophageal reflux disease)   . Hypercholesteremia   . Shortness of breath   . Chronic kidney disease (CKD), stage III (moderate)   . Pacemaker   . Anginal pain ([place])   . Gout   . Hypertension     dr [name]  . Coronary artery disease   . Elevated PSA 06/24/2013  . Myocardial infarction ([place])   . Headache   . Neuropathy [facility])     Social History   Social History  . Marital Status: Single    Spouse Name: N/A  . Number of Children: N/A  . Years of Education: N/A   Occupational History  . Not on file.   Social History Main Topics  . Smoking status: Never Smoker   . Smokeless tobacco: Never Used  . Alcohol Use: No  . Drug Use: No     Comment: last use august 2004  . Sexual Activity: Not Currently   Other Topics Concern  . Not on file   Social History Narrative   3 sons- one in [place], 1 in [place], on in [place]   Retired from post office- on disability   Completed Bachelors from [employer]    Past Surgical History  Procedure Laterality Date  . Pacemaker insertion  2009    with ICD  . Tee without cardioversion  01/17/2011    Procedure: TRANSESOPHAGEAL ECHOCARDIOGRAM (TEE);  Surgeon: Birdie Riddle, MD;  Location: [place];  Service: Cardiovascular;  Laterality: N/A;  . Cardioversion  01/17/2011    Procedure: CARDIOVERSION;  Surgeon: Birdie Riddle, MD;  Location: [place];  Service: Cardiovascular;  Laterality: N/A;  . Cardiac catheterization    . Insert / replace / remove pacemaker    . Anterior cervical decomp/discectomy fusion  08/21/2011  Procedure: ANTERIOR CERVICAL DECOMPRESSION/DISCECTOMY FUSION 2 LEVELS;  Surgeon: Marybelle Killings, MD;  Location: [name];  Service: Orthopedics;  Laterality: N/A;  C5-6, C6-7 Anterior Cervical Discectomy and Fusion, allograft, plate  . Tee without cardioversion N/A 02/16/2012    Procedure: TRANSESOPHAGEAL ECHOCARDIOGRAM (TEE);  Surgeon: Jolaine Artist, MD;  Location: [facility] ENDOSCOPY;  Service: Cardiovascular;  Laterality: N/A;  original case scheduled under his dad (who is deceased), rescheduled under correct mrn/pt/dob. [place]/dl  . Tee without cardioversion N/A 03/22/2012    Procedure: TRANSESOPHAGEAL ECHOCARDIOGRAM (TEE);  Surgeon: Lelon Perla, MD;  Location: [place];  Service: Cardiovascular;  Laterality: N/A;  . Cardioversion N/A 03/22/2012    Procedure: CARDIOVERSION;  Surgeon: Lelon Perla, MD;  Location: [facility] ENDOSCOPY;  Service: Cardiovascular;  Laterality: N/A;  . Cardioversion N/A 04/26/2012    Procedure: CARDIOVERSION;  Surgeon: Jolaine Artist, MD;  Location: [facility] ENDOSCOPY;  Service: Cardiovascular;  Laterality: N/A;  . Central venous catheter tunneled insertion single lumen  09/16/2012    right IJ  . Cardioversion N/A 11/08/2012    Procedure: CARDIOVERSION;  Surgeon: Jolaine Artist, MD;  Location: [facility] ENDOSCOPY;  Service: Cardiovascular;  Laterality: N/A;  . Back surgery  1987, lower    Ruptured disk repair  . Peripherally inserted central catheter insertion  2013  . Right heart catheterization N/A 01/26/2012    Procedure: RIGHT HEART CATH;  Surgeon: Jolaine Artist, MD;  Location: [facility] CATH LAB;  Service: Cardiovascular;  Laterality: N/A;  . Right heart catheterization N/A 04/30/2013    Procedure: RIGHT HEART CATH;  Surgeon: Jolaine Artist, MD;  Location: [facility] CATH LAB;  Service: Cardiovascular;  Laterality: N/A;    Family History  Problem Relation Age of Onset  . Heart disease    . Heart failure    . Stroke    . Anesthesia problems Neg Hx   . Hypotension Neg Hx   . Malignant hyperthermia Neg Hx   . Pseudochol deficiency Neg Hx   . Heart failure Sister   . Heart attack Sister   . Hyperlipidemia Mother     Allergies  Allergen Reactions  . Nitroglycerin Other (See Comments)    "feels like head is going to bust open"  . Colchicine Nausea Only    Current Outpatient Prescriptions on File Prior to Visit  Medication Sig Dispense Refill  . allopurinol (ZYLOPRIM) 100 MG tablet Take 1 tablet (100 mg total) by mouth daily. 30 tablet 6  . ALPRAZolam (XANAX) 0.5 MG tablet Take 1 tablet (0.5 mg total) by mouth 3 (three) times daily as needed for anxiety. 60 tablet 0  . apixaban (ELIQUIS) 5 MG  TABS tablet Take 1 tablet (5 mg total) by mouth 2 (two) times daily. 60 tablet 3  . atorvastatin (LIPITOR) 80 MG tablet Take 1 tablet (80 mg total) by mouth daily. 30 tablet 3  . carvedilol (COREG) 6.25 MG tablet TAKE 1 TABLET (6.25 MG TOTAL) BY MOUTH 2 (TWO) TIMES DAILY WITH A MEAL. 60 tablet 3  . dexlansoprazole (DEXILANT) 60 MG capsule Take 1 capsule (60 mg total) by mouth daily. 30 min prior to breakfast 30 capsule 3  . diphenhydrAMINE (BENADRYL) 50 MG capsule Take 50 mg by mouth daily as needed for itching.    Marland Kitchen eplerenone (INSPRA) 25 MG tablet Take 12.5 mg by mouth every other day.     . fluticasone (FLONASE) 50 MCG/ACT nasal spray PLACE 2 SPRAYS INTO BOTH NOSTRILS DAILY AS NEEDED FOR ALLERGIES. 16 g 5  .  gabapentin (NEURONTIN) 300 MG capsule Take 300 mg by mouth at bedtime.    . metolazone (ZAROXOLYN) 2.5 MG tablet Take 1 tablet (2.5 mg total) by mouth daily as needed (for fluid retention). 10 tablet 6  . milrinone (PRIMACOR) 20 MG/100ML SOLN infusion Inject 0.375 mcg/kg/min into the vein continuous. 440mg  in 452ml infusion 2.4 ml per hour    . ondansetron (ZOFRAN) 4 MG tablet Take 1 tablet (4 mg total) by mouth every 8 (eight) hours as needed for nausea or vomiting. 30 tablet 1  . oxyCODONE-acetaminophen (PERCOCET) 10-325 MG tablet Take 1 tablet by mouth every 6 (six) hours as needed for pain.     . potassium chloride 20 MEQ TBCR Take 40 mEq by mouth 2 (two) times daily. 60 tablet 3  . ranitidine (ZANTAC) 150 MG capsule Take 1 capsule (150 mg total) by mouth 2 (two) times daily. 60 capsule 0  . senna-docusate (SENOKOT-S) 8.6-50 MG per tablet Take 1-2 tablets by mouth 2 (two) times daily as needed for mild constipation or moderate constipation. 60 tablet 3  . sertraline (ZOLOFT) 100 MG tablet Take 1 tablet (100 mg total) by mouth daily. 90 tablet 1  . torsemide (DEMADEX) 20 MG tablet Take 80 mg by mouth daily.    Marland Kitchen zolpidem (AMBIEN) 5 MG tablet TAKE 1 AND 1/2 TABLET BY MOUTH EVERY DAY AT  BEDTIME 45 tablet 0   No current facility-administered medications on file prior to visit.    BP 110/80 mmHg  Pulse 100  Temp(Src) 97.6 F (36.4 C) (Oral)  Ht 6\' 4"  (1.93 m)  Wt 237 lb 3.2 oz (107.593 kg)  BMI 28.88 kg/m2  SpO2 95%       Objective:   Physical Exam  General Appearance- Not in acute distress.  HEENT Eyes- Scleraeral/Conjuntiva-bilat- Not Yellow. Mouth & Throat- Normal.  Chest and Lung Exam Auscultation: Breath sounds:-Normal. Adventitious sounds:- No Adventitious sounds.  Cardiovascular Auscultation:Rythm - Regular. Heart Sounds -Normal heart sounds.  Abdomen Inspection:-Inspection Normal.  Palpation/Perucssion: Palpation and Percussion of the abdomen reveal- faint epigastric  Tender, No Rebound tenderness, No rigidity(Guarding) and No Palpable abdominal masses.  Liver:-Normal.  Spleen:- Normal.   Back- no cva tenderness  Rectum- sphincter tone normal. Heme negative stool test. No masses         Assessment & Plan:  Symptoms appear about same as other day. Continue with current regiment until GI adjusts.  Will get lab to make sure mild anemia is stable and not dropping.  You have appointment scheduled with Gi tomorrow. Please attend.  Follow up after GI as regularly scheduled with pcp.

## 2015-01-13 NOTE — Patient Instructions (Signed)
Symptoms appear about same as other day. Continue with current regiment until GI adjusts.  Will get lab to make sure mild anemia is stable and not dropping.  You have appointment scheduled with Gi tomorrow. Please attend.  Follow up after GI as regularly scheduled with pcp.

## 2015-01-13 NOTE — Progress Notes (Signed)
Pre visit review using our clinic review tool, if applicable. No additional management support is needed unless otherwise documented below in the visit note. 

## 2015-01-14 ENCOUNTER — Ambulatory Visit (INDEPENDENT_AMBULATORY_CARE_PROVIDER_SITE_OTHER): Payer: Medicare Other | Admitting: Gastroenterology

## 2015-01-14 ENCOUNTER — Ambulatory Visit (HOSPITAL_COMMUNITY)
Admission: RE | Admit: 2015-01-14 | Discharge: 2015-01-14 | Disposition: A | Discharge status: Home / Self Care | Payer: Medicare Other | Source: Ambulatory Visit | Attending: Physician Assistant | Admitting: Physician Assistant

## 2015-01-14 ENCOUNTER — Telehealth: Payer: Self-pay | Admitting: *Deleted

## 2015-01-14 ENCOUNTER — Encounter: Payer: Self-pay | Admitting: Gastroenterology

## 2015-01-14 VITALS — BP 100/60 | HR 84 | Ht 76.0 in | Wt 234.2 lb

## 2015-01-14 DIAGNOSIS — R112 Nausea with vomiting, unspecified: Secondary | ICD-10-CM

## 2015-01-14 DIAGNOSIS — K219 Gastro-esophageal reflux disease without esophagitis: Secondary | ICD-10-CM | POA: Insufficient documentation

## 2015-01-14 DIAGNOSIS — R1310 Dysphagia, unspecified: Secondary | ICD-10-CM

## 2015-01-14 DIAGNOSIS — R079 Chest pain, unspecified: Secondary | ICD-10-CM | POA: Insufficient documentation

## 2015-01-14 DIAGNOSIS — R109 Unspecified abdominal pain: Secondary | ICD-10-CM

## 2015-01-14 DIAGNOSIS — R1013 Epigastric pain: Secondary | ICD-10-CM | POA: Insufficient documentation

## 2015-01-14 DIAGNOSIS — R0602 Shortness of breath: Secondary | ICD-10-CM | POA: Diagnosis not present

## 2015-01-14 DIAGNOSIS — R6881 Early satiety: Secondary | ICD-10-CM

## 2015-01-14 DIAGNOSIS — R140 Abdominal distension (gaseous): Secondary | ICD-10-CM | POA: Insufficient documentation

## 2015-01-14 DIAGNOSIS — I509 Heart failure, unspecified: Secondary | ICD-10-CM | POA: Diagnosis not present

## 2015-01-14 DIAGNOSIS — I5022 Chronic systolic (congestive) heart failure: Secondary | ICD-10-CM | POA: Diagnosis not present

## 2015-01-14 MED ORDER — TECHNETIUM TC 99M SULFUR COLLOID
2.1000 | Freq: Once | INTRAVENOUS | Status: AC | PRN
  Administered 2015-01-14: 2.1 via ORAL

## 2015-01-14 NOTE — Telephone Encounter (Signed)
01/14/2015   RE: [name] DOB: 04/18/1950 MRN: [id number]  Dear Dr Haroldine Laws,   We have scheduled the above patient for an endoscopy procedure. Our records show that he is on anticoagulation therapy.  Please advise as to whether the patient may come off his therapy of Eliquis 2 days prior to the procedure, which is scheduled for 01/21/15. Please route your response to Dixon Boos, CMA.  Sincerely,  Dixon Boos

## 2015-01-14 NOTE — Patient Instructions (Addendum)
You have been scheduled for a CT scan of the abdomen and pelvis at [facility] ([address]---this is in the same building as Press [facility]).   You are scheduled on Wednesday 01/20/15 at 9:30 am. You should arrive 15 minutes prior to your appointment time for registration. Please follow the written instructions below on the day of your exam:  WARNING: IF YOU ARE ALLERGIC TO IODINE/X-RAY DYE, PLEASE NOTIFY RADIOLOGY IMMEDIATELY AT [phone]! YOU WILL BE GIVEN A 13 HOUR PREMEDICATION PREP.  1) Do not eat or drink anything after 5:30 am (4 hours prior to your test)  better if refrigerated, but do NOT add ice or any other liquid to this solution. Shake well before drinking.    Drink 1 bottle of contrast @ 6:30 am (2 hours prior to your exam)  Drink 1 bottle of contrast @ 7:30 am (1 hour prior to your exam)  You may take any medications as prescribed with a small amount of water except for the following: Metformin, Glucophage, Glucovance, Avandamet, Riomet, Fortamet, Actoplus Met, Janumet, Glumetza or Metaglip. The above medications must be held the day of the exam AND 48 hours after the exam.  The purpose of you drinking the oral contrast is to aid in the visualization of your intestinal tract. The contrast solution may cause some diarrhea. Before your exam is started, you will be given a small amount of fluid to drink. Depending on your individual set of symptoms, you may also receive an intravenous injection of x-ray contrast/dye. Plan on being at [facility] for 30 minutes or longer, depending on the type of exam you are having performed.  This test typically takes 30-45 minutes to complete.  If you have any questions regarding your exam or if you need to reschedule, you may call the CT department at [phone] between the hours of 8:00 am and 5:00 pm, Monday-Friday.  ________________________________________________________________________  [name] have been  scheduled for an endoscopy. Please follow written instructions given to you at your visit today. If you use inhalers (even only as needed), please bring them with you on the day of your procedure. Your physician has requested that you go to www.startemmi.com and enter the access code given to you at your visit today. This web site gives a general overview about your procedure. However, you should still follow specific instructions given to you by our office regarding your preparation for the procedure.  You will need to hold your eliquis for 2 days prior to the EGD, we will make sure Dr. Haroldine Laws thinks this is safe.

## 2015-01-14 NOTE — Progress Notes (Signed)
Review of pertinent gastrointestinal problems: 1. Intermittent vomiting, abdominal pain; abdominal ultrasound in August 2015, which showed suggestion of mild gallbladder sludge, mild hepatic steatosis without focal mass, and subcentimeter left renal cyst, but was otherwise unremarkable. He had an EGD in June of 2012 by Dr. Michail Sermon from [place] for evaluation of epigastric abdominal pain that was normal at that time. Recent CBC, lipase, and hepatic function panel were within normal limits. He had a CT scan in January 2014 without contrast for evaluation of epigastric abdominal pain; He was set to have colonoscopy, EGD 09/2013 Dr. Ardis Hughs at [facility] but presented after his prep hypotensive with HR 110-130s. Procedure was cancelled. 10/2013 decided to treat GI symptoms empirically with gas-ex, H2 blocker 2. Chronic systolic heart failure IIIB (per cardiology note 10/2013) despite 24 hour continuous inotropic support EF 20-25%.   HPI: This is a   Pleasant 65 year old man whom I last saw about a year ago.     GES 01/2015 normal Abd [place] 01/2015 essentially normal  Has abdominal pains.  Dypshagia at times to solid foods,  This occurs through 4 times per week. He will usually drink extra liquids to get the food to go down..    Stomach is "on fire". Feels a pushing abdominal pain that he believes makes him short of breath.  Vomits just about daily.   his weight has been stable.  Not taking any NSAIDs currently.  No overt GI bleeding.  Labs  recently showed hemoglobin 11.7, AST slightly elevated, otherwise complete metabolic profile is normal.  Chief complaint is  Abdominal pain, dysphagia   Past Medical History  Diagnosis Date  . CHF (congestive heart failure) ([place])   . Sarcoidosis ([place])   . Cardiomyopathy, dilated, nonischemic ([place])     non ischemic by cath  . Acute on chronic systolic heart failure ([place])   . Automatic implantable cardiac defibrillator in situ   . Atrial fibrillation ([place])    . NSVT (nonsustained ventricular tachycardia) ([place])   . GERD (gastroesophageal reflux disease)   . Hypercholesteremia   . Shortness of breath   . Chronic kidney disease (CKD), stage III (moderate)   . Pacemaker   . Anginal pain ([place])   . Gout   . Hypertension     dr [name]  . Coronary artery disease   . Elevated PSA 06/24/2013  . Myocardial infarction ([place])   . Headache   . Neuropathy ([place])   . Stroke [facility]) 08/2014    Past Surgical History  Procedure Laterality Date  . Pacemaker insertion  2009    with ICD  . Tee without cardioversion  01/17/2011    Procedure: TRANSESOPHAGEAL ECHOCARDIOGRAM (TEE);  Surgeon: Birdie Riddle, MD;  Location: [place];  Service: Cardiovascular;  Laterality: N/A;  . Cardioversion  01/17/2011    Procedure: CARDIOVERSION;  Surgeon: Birdie Riddle, MD;  Location: [place];  Service: Cardiovascular;  Laterality: N/A;  . Cardiac catheterization    . Insert / replace / remove pacemaker    . Anterior cervical decomp/discectomy fusion  08/21/2011    Procedure: ANTERIOR CERVICAL DECOMPRESSION/DISCECTOMY FUSION 2 LEVELS;  Surgeon: Marybelle Killings, MD;  Location: [place];  Service: Orthopedics;  Laterality: N/A;  C5-6, C6-7 Anterior Cervical Discectomy and Fusion, allograft, plate  . Tee without cardioversion N/A 02/16/2012    Procedure: TRANSESOPHAGEAL ECHOCARDIOGRAM (TEE);  Surgeon: Jolaine Artist, MD;  Location: [facility] ENDOSCOPY;  Service: Cardiovascular;  Laterality: N/A;  original case scheduled under his dad (who is deceased),  rescheduled under correct mrn/pt/dob. [place]/dl  . Tee without cardioversion N/A 03/22/2012    Procedure: TRANSESOPHAGEAL ECHOCARDIOGRAM (TEE);  Surgeon: Lelon Perla, MD;  Location: [place];  Service: Cardiovascular;  Laterality: N/A;  . Cardioversion N/A 03/22/2012    Procedure: CARDIOVERSION;  Surgeon: Lelon Perla, MD;  Location: [facility] ENDOSCOPY;  Service: Cardiovascular;  Laterality: N/A;  . Cardioversion N/A 04/26/2012     Procedure: CARDIOVERSION;  Surgeon: Jolaine Artist, MD;  Location: [facility] ENDOSCOPY;  Service: Cardiovascular;  Laterality: N/A;  . Central venous catheter tunneled insertion single lumen  09/16/2012    right IJ  . Cardioversion N/A 11/08/2012    Procedure: CARDIOVERSION;  Surgeon: Jolaine Artist, MD;  Location: [facility] ENDOSCOPY;  Service: Cardiovascular;  Laterality: N/A;  . Back surgery  1987, lower    Ruptured disk repair  . Peripherally inserted central catheter insertion  2013  . Right heart catheterization N/A 01/26/2012    Procedure: RIGHT HEART CATH;  Surgeon: Jolaine Artist, MD;  Location: [facility] CATH LAB;  Service: Cardiovascular;  Laterality: N/A;  . Right heart catheterization N/A 04/30/2013    Procedure: RIGHT HEART CATH;  Surgeon: Jolaine Artist, MD;  Location: [facility] CATH LAB;  Service: Cardiovascular;  Laterality: N/A;    Current Outpatient Prescriptions  Medication Sig Dispense Refill  . allopurinol (ZYLOPRIM) 100 MG tablet Take 1 tablet (100 mg total) by mouth daily. 30 tablet 6  . ALPRAZolam (XANAX) 0.5 MG tablet Take 1 tablet (0.5 mg total) by mouth 3 (three) times daily as needed for anxiety. 60 tablet 0  . apixaban (ELIQUIS) 5 MG TABS tablet Take 1 tablet (5 mg total) by mouth 2 (two) times daily. 60 tablet 3  . atorvastatin (LIPITOR) 80 MG tablet Take 1 tablet (80 mg total) by mouth daily. 30 tablet 3  . carvedilol (COREG) 6.25 MG tablet TAKE 1 TABLET (6.25 MG TOTAL) BY MOUTH 2 (TWO) TIMES DAILY WITH A MEAL. 60 tablet 3  . dexlansoprazole (DEXILANT) 60 MG capsule Take 1 capsule (60 mg total) by mouth daily. 30 min prior to breakfast 30 capsule 3  . diphenhydrAMINE (BENADRYL) 50 MG capsule Take 50 mg by mouth daily as needed for itching.    Marland Kitchen eplerenone (INSPRA) 25 MG tablet Take 12.5 mg by mouth every other day.     . fluticasone (FLONASE) 50 MCG/ACT nasal spray PLACE 2 SPRAYS INTO BOTH NOSTRILS DAILY AS NEEDED FOR ALLERGIES. 16 g 5  . gabapentin (NEURONTIN) 300 MG  capsule Take 300 mg by mouth at bedtime.    . metolazone (ZAROXOLYN) 2.5 MG tablet Take 1 tablet (2.5 mg total) by mouth daily as needed (for fluid retention). 10 tablet 6  . milrinone (PRIMACOR) 20 MG/100ML SOLN infusion Inject 0.375 mcg/kg/min into the vein continuous. 440mg  in 428ml infusion 2.4 ml per hour    . ondansetron (ZOFRAN) 4 MG tablet Take 1 tablet (4 mg total) by mouth every 8 (eight) hours as needed for nausea or vomiting. 30 tablet 1  . oxyCODONE-acetaminophen (PERCOCET) 10-325 MG tablet Take 1 tablet by mouth every 6 (six) hours as needed for pain.     . potassium chloride 20 MEQ TBCR Take 40 mEq by mouth 2 (two) times daily. 60 tablet 3  . ranitidine (ZANTAC) 150 MG capsule Take 1 capsule (150 mg total) by mouth 2 (two) times daily. 60 capsule 0  . senna-docusate (SENOKOT-S) 8.6-50 MG per tablet Take 1-2 tablets by mouth 2 (two) times daily as needed for mild constipation  or moderate constipation. 60 tablet 3  . sertraline (ZOLOFT) 100 MG tablet Take 1 tablet (100 mg total) by mouth daily. 90 tablet 1  . torsemide (DEMADEX) 20 MG tablet Take 80 mg by mouth daily.    Marland Kitchen zolpidem (AMBIEN) 5 MG tablet TAKE 1 AND 1/2 TABLET BY MOUTH EVERY DAY AT BEDTIME 45 tablet 0   No current facility-administered medications for this visit.    Allergies as of 01/14/2015 - Review Complete 01/14/2015  Allergen Reaction Noted  . Nitroglycerin Other (See Comments) 11/16/2012  . Colchicine Nausea Only 12/17/2013    Family History  Problem Relation Age of Onset  . Heart disease    . Heart failure    . Stroke    . Anesthesia problems Neg Hx   . Hypotension Neg Hx   . Malignant hyperthermia Neg Hx   . Pseudochol deficiency Neg Hx   . Heart failure Sister   . Heart attack Sister   . Hyperlipidemia Mother   . Colon cancer Father     dx in his early 57's    Social History   Social History  . Marital Status: Single    Spouse Name: N/A  . Number of Children: 4  . Years of Education: N/A    Occupational History  . retired    Social History Main Topics  . Smoking status: Never Smoker   . Smokeless tobacco: Never Used  . Alcohol Use: No  . Drug Use: No     Comment: last use august 2004  . Sexual Activity: Not Currently   Other Topics Concern  . Not on file   Social History Narrative   3 sons- one in [place], 1 in [place], on in [place]   Retired from post office- on disability   Completed Bachelors from [employer]     Physical Exam: BP 100/60 mmHg  Pulse 84  Ht 6\' 4"  (1.93 m)  Wt 234 lb 3.2 oz (106.232 kg)  BMI 28.52 kg/m2 Constitutional: generally well-appearing Psychiatric: alert and oriented x3 Abdomen: soft, nontender, nondistended, no obvious ascites, no peritoneal signs, normal bowel sounds   Assessment and plan: 65 y.o. male with  Abdominal pain, dysphagia   unclear etiology of his abdominal pain however recent gastric emptying scan recent abdominal ultrasound and lab test being essentially normal or all reassuring. I think a CAT scan is probably the best imaging test available to evaluate this abdominal pain we will arrange for that. He has chronic intermittent solid food dysphagia now schedule him for EGD at [place] at his soonest convenience. He is on a blood thinner and we will contact his cardiologist to make sure it is safe for him to hold the blood thinner for 2 days prior to the upper endoscopy which might require esophageal dilation.   Owens Loffler, MD [facility] 01/14/2015, 2:35 PM

## 2015-01-14 NOTE — Telephone Encounter (Signed)
Ok to hold Eliquis. For endoscopy should only need to hold for 1 day but ok to hold for 2 if desired. Please resume as soon as possible after procedure.

## 2015-01-15 ENCOUNTER — Inpatient Hospital Stay (HOSPITAL_COMMUNITY)
Admission: EM | Admit: 2015-01-15 | Discharge: 2015-02-03 | DRG: 314 | Disposition: E | Payer: Medicare Other | Attending: Internal Medicine | Admitting: Internal Medicine

## 2015-01-15 ENCOUNTER — Encounter (HOSPITAL_COMMUNITY): Payer: Self-pay | Admitting: *Deleted

## 2015-01-15 ENCOUNTER — Telehealth (HOSPITAL_COMMUNITY): Payer: Self-pay | Admitting: Cardiology

## 2015-01-15 ENCOUNTER — Encounter (HOSPITAL_COMMUNITY): Payer: Self-pay | Admitting: Nurse Practitioner

## 2015-01-15 ENCOUNTER — Emergency Department (HOSPITAL_COMMUNITY): Payer: Medicare Other

## 2015-01-15 ENCOUNTER — Other Ambulatory Visit: Payer: Self-pay

## 2015-01-15 DIAGNOSIS — I5022 Chronic systolic (congestive) heart failure: Secondary | ICD-10-CM | POA: Diagnosis not present

## 2015-01-15 DIAGNOSIS — Z7901 Long term (current) use of anticoagulants: Secondary | ICD-10-CM

## 2015-01-15 DIAGNOSIS — T80211A Bloodstream infection due to central venous catheter, initial encounter: Principal | ICD-10-CM | POA: Diagnosis present

## 2015-01-15 DIAGNOSIS — F329 Major depressive disorder, single episode, unspecified: Secondary | ICD-10-CM | POA: Diagnosis present

## 2015-01-15 DIAGNOSIS — Z8249 Family history of ischemic heart disease and other diseases of the circulatory system: Secondary | ICD-10-CM | POA: Diagnosis not present

## 2015-01-15 DIAGNOSIS — K219 Gastro-esophageal reflux disease without esophagitis: Secondary | ICD-10-CM | POA: Diagnosis present

## 2015-01-15 DIAGNOSIS — R112 Nausea with vomiting, unspecified: Secondary | ICD-10-CM | POA: Diagnosis not present

## 2015-01-15 DIAGNOSIS — Z4682 Encounter for fitting and adjustment of non-vascular catheter: Secondary | ICD-10-CM | POA: Diagnosis not present

## 2015-01-15 DIAGNOSIS — R6881 Early satiety: Secondary | ICD-10-CM | POA: Diagnosis not present

## 2015-01-15 DIAGNOSIS — I252 Old myocardial infarction: Secondary | ICD-10-CM | POA: Diagnosis not present

## 2015-01-15 DIAGNOSIS — G629 Polyneuropathy, unspecified: Secondary | ICD-10-CM | POA: Diagnosis present

## 2015-01-15 DIAGNOSIS — R072 Precordial pain: Secondary | ICD-10-CM | POA: Diagnosis not present

## 2015-01-15 DIAGNOSIS — E7800 Pure hypercholesterolemia, unspecified: Secondary | ICD-10-CM | POA: Diagnosis present

## 2015-01-15 DIAGNOSIS — R0789 Other chest pain: Secondary | ICD-10-CM | POA: Diagnosis not present

## 2015-01-15 DIAGNOSIS — Z823 Family history of stroke: Secondary | ICD-10-CM | POA: Diagnosis not present

## 2015-01-15 DIAGNOSIS — Z8673 Personal history of transient ischemic attack (TIA), and cerebral infarction without residual deficits: Secondary | ICD-10-CM

## 2015-01-15 DIAGNOSIS — Z66 Do not resuscitate: Secondary | ICD-10-CM | POA: Diagnosis present

## 2015-01-15 DIAGNOSIS — G8929 Other chronic pain: Secondary | ICD-10-CM | POA: Diagnosis present

## 2015-01-15 DIAGNOSIS — I129 Hypertensive chronic kidney disease with stage 1 through stage 4 chronic kidney disease, or unspecified chronic kidney disease: Secondary | ICD-10-CM | POA: Diagnosis not present

## 2015-01-15 DIAGNOSIS — I480 Paroxysmal atrial fibrillation: Secondary | ICD-10-CM | POA: Diagnosis not present

## 2015-01-15 DIAGNOSIS — N179 Acute kidney failure, unspecified: Secondary | ICD-10-CM | POA: Diagnosis not present

## 2015-01-15 DIAGNOSIS — R1013 Epigastric pain: Secondary | ICD-10-CM

## 2015-01-15 DIAGNOSIS — I482 Chronic atrial fibrillation: Secondary | ICD-10-CM | POA: Diagnosis not present

## 2015-01-15 DIAGNOSIS — Z515 Encounter for palliative care: Secondary | ICD-10-CM | POA: Diagnosis present

## 2015-01-15 DIAGNOSIS — E876 Hypokalemia: Secondary | ICD-10-CM | POA: Diagnosis present

## 2015-01-15 DIAGNOSIS — R748 Abnormal levels of other serum enzymes: Secondary | ICD-10-CM | POA: Diagnosis present

## 2015-01-15 DIAGNOSIS — T827XXA Infection and inflammatory reaction due to other cardiac and vascular devices, implants and grafts, initial encounter: Secondary | ICD-10-CM | POA: Diagnosis not present

## 2015-01-15 DIAGNOSIS — Z9981 Dependence on supplemental oxygen: Secondary | ICD-10-CM | POA: Diagnosis not present

## 2015-01-15 DIAGNOSIS — J9601 Acute respiratory failure with hypoxia: Secondary | ICD-10-CM | POA: Diagnosis not present

## 2015-01-15 DIAGNOSIS — R1310 Dysphagia, unspecified: Secondary | ICD-10-CM | POA: Diagnosis not present

## 2015-01-15 DIAGNOSIS — R4020 Unspecified coma: Secondary | ICD-10-CM | POA: Diagnosis not present

## 2015-01-15 DIAGNOSIS — R570 Cardiogenic shock: Secondary | ICD-10-CM | POA: Insufficient documentation

## 2015-01-15 DIAGNOSIS — K760 Fatty (change of) liver, not elsewhere classified: Secondary | ICD-10-CM | POA: Diagnosis present

## 2015-01-15 DIAGNOSIS — E785 Hyperlipidemia, unspecified: Secondary | ICD-10-CM | POA: Diagnosis present

## 2015-01-15 DIAGNOSIS — I5023 Acute on chronic systolic (congestive) heart failure: Secondary | ICD-10-CM | POA: Insufficient documentation

## 2015-01-15 DIAGNOSIS — I420 Dilated cardiomyopathy: Secondary | ICD-10-CM

## 2015-01-15 DIAGNOSIS — R7881 Bacteremia: Secondary | ICD-10-CM | POA: Diagnosis not present

## 2015-01-15 DIAGNOSIS — I2510 Atherosclerotic heart disease of native coronary artery without angina pectoris: Secondary | ICD-10-CM | POA: Diagnosis present

## 2015-01-15 DIAGNOSIS — F419 Anxiety disorder, unspecified: Secondary | ICD-10-CM | POA: Diagnosis not present

## 2015-01-15 DIAGNOSIS — N184 Chronic kidney disease, stage 4 (severe): Secondary | ICD-10-CM | POA: Diagnosis not present

## 2015-01-15 DIAGNOSIS — D869 Sarcoidosis, unspecified: Secondary | ICD-10-CM | POA: Diagnosis not present

## 2015-01-15 DIAGNOSIS — Z452 Encounter for adjustment and management of vascular access device: Secondary | ICD-10-CM

## 2015-01-15 DIAGNOSIS — Z79899 Other long term (current) drug therapy: Secondary | ICD-10-CM

## 2015-01-15 DIAGNOSIS — N189 Chronic kidney disease, unspecified: Secondary | ICD-10-CM | POA: Diagnosis not present

## 2015-01-15 DIAGNOSIS — R0602 Shortness of breath: Secondary | ICD-10-CM

## 2015-01-15 DIAGNOSIS — Z95810 Presence of automatic (implantable) cardiac defibrillator: Secondary | ICD-10-CM | POA: Diagnosis present

## 2015-01-15 DIAGNOSIS — Z888 Allergy status to other drugs, medicaments and biological substances status: Secondary | ICD-10-CM

## 2015-01-15 DIAGNOSIS — I429 Cardiomyopathy, unspecified: Secondary | ICD-10-CM

## 2015-01-15 DIAGNOSIS — Y848 Other medical procedures as the cause of abnormal reaction of the patient, or of later complication, without mention of misadventure at the time of the procedure: Secondary | ICD-10-CM | POA: Diagnosis present

## 2015-01-15 DIAGNOSIS — T8579XA Infection and inflammatory reaction due to other internal prosthetic devices, implants and grafts, initial encounter: Secondary | ICD-10-CM

## 2015-01-15 DIAGNOSIS — R7989 Other specified abnormal findings of blood chemistry: Secondary | ICD-10-CM | POA: Diagnosis present

## 2015-01-15 DIAGNOSIS — K55019 Acute (reversible) ischemia of small intestine, extent unspecified: Secondary | ICD-10-CM | POA: Diagnosis not present

## 2015-01-15 DIAGNOSIS — R079 Chest pain, unspecified: Secondary | ICD-10-CM

## 2015-01-15 DIAGNOSIS — R778 Other specified abnormalities of plasma proteins: Secondary | ICD-10-CM | POA: Diagnosis present

## 2015-01-15 DIAGNOSIS — A419 Sepsis, unspecified organism: Secondary | ICD-10-CM | POA: Diagnosis not present

## 2015-01-15 DIAGNOSIS — M109 Gout, unspecified: Secondary | ICD-10-CM | POA: Diagnosis not present

## 2015-01-15 DIAGNOSIS — D638 Anemia in other chronic diseases classified elsewhere: Secondary | ICD-10-CM | POA: Diagnosis present

## 2015-01-15 DIAGNOSIS — T827XXD Infection and inflammatory reaction due to other cardiac and vascular devices, implants and grafts, subsequent encounter: Secondary | ICD-10-CM | POA: Diagnosis not present

## 2015-01-15 DIAGNOSIS — I10 Essential (primary) hypertension: Secondary | ICD-10-CM | POA: Diagnosis present

## 2015-01-15 DIAGNOSIS — I4891 Unspecified atrial fibrillation: Secondary | ICD-10-CM | POA: Diagnosis present

## 2015-01-15 LAB — CBC
HEMATOCRIT: 36.5 % — AB (ref 39.0–52.0)
HEMOGLOBIN: 11.8 g/dL — AB (ref 13.0–17.0)
MCH: 29 pg (ref 26.0–34.0)
MCHC: 32.3 g/dL (ref 30.0–36.0)
MCV: 89.7 fL (ref 78.0–100.0)
Platelets: 276 10*3/uL (ref 150–400)
RBC: 4.07 MIL/uL — AB (ref 4.22–5.81)
RDW: 14.5 % (ref 11.5–15.5)
WBC: 6.2 10*3/uL (ref 4.0–10.5)

## 2015-01-15 LAB — BRAIN NATRIURETIC PEPTIDE: B Natriuretic Peptide: 700 pg/mL — ABNORMAL HIGH (ref 0.0–100.0)

## 2015-01-15 LAB — BASIC METABOLIC PANEL
ANION GAP: 13 (ref 5–15)
BUN: 17 mg/dL (ref 6–20)
CHLORIDE: 103 mmol/L (ref 101–111)
CO2: 25 mmol/L (ref 22–32)
Calcium: 9.7 mg/dL (ref 8.9–10.3)
Creatinine, Ser: 1.72 mg/dL — ABNORMAL HIGH (ref 0.61–1.24)
GFR calc Af Amer: 46 mL/min — ABNORMAL LOW (ref >60)
GFR calc non Af Amer: 40 mL/min — ABNORMAL LOW (ref >60)
GLUCOSE: 93 mg/dL (ref 65–99)
POTASSIUM: 3.2 mmol/L — AB (ref 3.5–5.1)
Sodium: 141 mmol/L (ref 135–145)

## 2015-01-15 LAB — URINALYSIS, ROUTINE W REFLEX MICROSCOPIC
Bilirubin Urine: NEGATIVE
Glucose, UA: NEGATIVE mg/dL
Hgb urine dipstick: NEGATIVE
Ketones, ur: NEGATIVE mg/dL
Leukocytes, UA: NEGATIVE
NITRITE: NEGATIVE
PROTEIN: 30 mg/dL — AB
SPECIFIC GRAVITY, URINE: 1.006 (ref 1.005–1.030)
pH: 5.5 (ref 5.0–8.0)

## 2015-01-15 LAB — I-STAT CG4 LACTIC ACID, ED
LACTIC ACID, VENOUS: 1.86 mmol/L (ref 0.5–2.0)
Lactic Acid, Venous: 1.37 mmol/L (ref 0.5–2.0)

## 2015-01-15 LAB — URINE MICROSCOPIC-ADD ON: RBC / HPF: NONE SEEN RBC/hpf (ref 0–5)

## 2015-01-15 LAB — HEPATIC FUNCTION PANEL
ALT: 22 U/L (ref 17–63)
AST: 31 U/L (ref 15–41)
Albumin: 2.8 g/dL — ABNORMAL LOW (ref 3.5–5.0)
Alkaline Phosphatase: 65 U/L (ref 38–126)
BILIRUBIN DIRECT: 0.5 mg/dL (ref 0.1–0.5)
BILIRUBIN INDIRECT: 0.9 mg/dL (ref 0.3–0.9)
BILIRUBIN TOTAL: 1.4 mg/dL — AB (ref 0.3–1.2)
Total Protein: 7.2 g/dL (ref 6.5–8.1)

## 2015-01-15 LAB — DIFFERENTIAL
BASOS PCT: 0 %
Basophils Absolute: 0 10*3/uL (ref 0.0–0.1)
EOS PCT: 2 %
Eosinophils Absolute: 0.1 10*3/uL (ref 0.0–0.7)
LYMPHS PCT: 26 %
Lymphs Abs: 1.5 10*3/uL (ref 0.7–4.0)
MONOS PCT: 8 %
Monocytes Absolute: 0.5 10*3/uL (ref 0.1–1.0)
NEUTROS ABS: 3.8 10*3/uL (ref 1.7–7.7)
Neutrophils Relative %: 64 %

## 2015-01-15 LAB — I-STAT TROPONIN, ED: Troponin i, poc: 0.11 ng/mL (ref 0.00–0.08)

## 2015-01-15 LAB — LIPASE, BLOOD: Lipase: 27 U/L (ref 11–51)

## 2015-01-15 MED ORDER — OXYCODONE-ACETAMINOPHEN 5-325 MG PO TABS
1.0000 | ORAL_TABLET | Freq: Four times a day (QID) | ORAL | Status: DC | PRN
  Administered 2015-01-16 – 2015-01-19 (×6): 1 via ORAL
  Filled 2015-01-15 (×6): qty 1

## 2015-01-15 MED ORDER — ZOLPIDEM TARTRATE 5 MG PO TABS
5.0000 mg | ORAL_TABLET | Freq: Every evening | ORAL | Status: DC | PRN
  Administered 2015-01-16 – 2015-01-19 (×3): 5 mg via ORAL
  Filled 2015-01-15 (×3): qty 1

## 2015-01-15 MED ORDER — SERTRALINE HCL 100 MG PO TABS
100.0000 mg | ORAL_TABLET | Freq: Every day | ORAL | Status: DC
  Administered 2015-01-16 – 2015-01-20 (×5): 100 mg via ORAL
  Filled 2015-01-15 (×6): qty 1

## 2015-01-15 MED ORDER — VANCOMYCIN HCL 10 G IV SOLR
2000.0000 mg | Freq: Once | INTRAVENOUS | Status: AC
  Administered 2015-01-16: 2000 mg via INTRAVENOUS
  Filled 2015-01-15 (×2): qty 2000

## 2015-01-15 MED ORDER — SENNOSIDES-DOCUSATE SODIUM 8.6-50 MG PO TABS: 1.0000 | ORAL_TABLET | Freq: Two times a day (BID) | ORAL | Status: DC | PRN

## 2015-01-15 MED ORDER — FAMOTIDINE 20 MG PO TABS
20.0000 mg | ORAL_TABLET | Freq: Two times a day (BID) | ORAL | Status: DC
  Administered 2015-01-16 – 2015-01-18 (×7): 20 mg via ORAL
  Filled 2015-01-15 (×8): qty 1

## 2015-01-15 MED ORDER — ONDANSETRON HCL 4 MG PO TABS
4.0000 mg | ORAL_TABLET | Freq: Three times a day (TID) | ORAL | Status: DC | PRN
  Administered 2015-01-17: 4 mg via ORAL
  Filled 2015-01-15: qty 1

## 2015-01-15 MED ORDER — METOLAZONE 5 MG PO TABS: 2.5000 mg | ORAL_TABLET | Freq: Every day | ORAL | Status: DC | PRN

## 2015-01-15 MED ORDER — ASPIRIN 81 MG PO CHEW
324.0000 mg | CHEWABLE_TABLET | Freq: Once | ORAL | Status: AC
  Administered 2015-01-15: 324 mg via ORAL
  Filled 2015-01-15: qty 4

## 2015-01-15 MED ORDER — ATORVASTATIN CALCIUM 80 MG PO TABS
80.0000 mg | ORAL_TABLET | Freq: Every day | ORAL | Status: DC
  Administered 2015-01-16 – 2015-01-20 (×5): 80 mg via ORAL
  Filled 2015-01-15 (×6): qty 1

## 2015-01-15 MED ORDER — CARVEDILOL 6.25 MG PO TABS
6.2500 mg | ORAL_TABLET | Freq: Two times a day (BID) | ORAL | Status: DC
  Administered 2015-01-16 – 2015-01-18 (×6): 6.25 mg via ORAL
  Filled 2015-01-15 (×6): qty 1

## 2015-01-15 MED ORDER — APIXABAN 5 MG PO TABS
5.0000 mg | ORAL_TABLET | Freq: Two times a day (BID) | ORAL | Status: DC
  Administered 2015-01-16 – 2015-01-20 (×10): 5 mg via ORAL
  Filled 2015-01-15 (×11): qty 1

## 2015-01-15 MED ORDER — ONDANSETRON HCL 4 MG/2ML IJ SOLN: 4.0000 mg | Freq: Four times a day (QID) | INTRAMUSCULAR | Status: DC | PRN

## 2015-01-15 MED ORDER — TORSEMIDE 20 MG PO TABS
40.0000 mg | ORAL_TABLET | Freq: Every day | ORAL | Status: DC
  Administered 2015-01-16 – 2015-01-17 (×2): 40 mg via ORAL
  Filled 2015-01-15 (×2): qty 2

## 2015-01-15 MED ORDER — SPIRONOLACTONE 25 MG PO TABS
25.0000 mg | ORAL_TABLET | Freq: Every day | ORAL | Status: DC
  Administered 2015-01-16 – 2015-01-17 (×2): 25 mg via ORAL
  Filled 2015-01-15 (×2): qty 1

## 2015-01-15 MED ORDER — PANTOPRAZOLE SODIUM 40 MG PO TBEC
40.0000 mg | DELAYED_RELEASE_TABLET | Freq: Every day | ORAL | Status: DC
  Administered 2015-01-16 – 2015-01-20 (×5): 40 mg via ORAL
  Filled 2015-01-15 (×6): qty 1

## 2015-01-15 MED ORDER — ALPRAZOLAM 0.5 MG PO TABS
0.5000 mg | ORAL_TABLET | Freq: Three times a day (TID) | ORAL | Status: DC | PRN
  Administered 2015-01-16 – 2015-01-20 (×3): 0.5 mg via ORAL
  Filled 2015-01-15 (×3): qty 1

## 2015-01-15 MED ORDER — MILRINONE IN DEXTROSE 20 MG/100ML IV SOLN
0.5000 ug/kg/min | INTRAVENOUS | Status: DC
  Administered 2015-01-16 – 2015-01-18 (×9): 0.375 ug/kg/min via INTRAVENOUS
  Administered 2015-01-18: 0.5 ug/kg/min via INTRAVENOUS
  Filled 2015-01-15 (×11): qty 100

## 2015-01-15 MED ORDER — POTASSIUM CHLORIDE 20 MEQ PO PACK
40.0000 meq | PACK | Freq: Once | ORAL | Status: AC
  Administered 2015-01-15: 40 meq via ORAL
  Filled 2015-01-15: qty 2

## 2015-01-15 MED ORDER — OXYCODONE HCL 5 MG PO TABS
5.0000 mg | ORAL_TABLET | Freq: Four times a day (QID) | ORAL | Status: DC | PRN
  Administered 2015-01-16 – 2015-01-19 (×6): 5 mg via ORAL
  Filled 2015-01-15 (×6): qty 1

## 2015-01-15 MED ORDER — MORPHINE SULFATE (PF) 4 MG/ML IV SOLN
4.0000 mg | Freq: Once | INTRAVENOUS | Status: AC
  Administered 2015-01-15: 4 mg via INTRAVENOUS
  Filled 2015-01-15: qty 1

## 2015-01-15 MED ORDER — ONDANSETRON HCL 4 MG/2ML IJ SOLN: 4.0000 mg | Freq: Three times a day (TID) | INTRAMUSCULAR | Status: DC | PRN

## 2015-01-15 MED ORDER — OXYCODONE-ACETAMINOPHEN 10-325 MG PO TABS
1.0000 | ORAL_TABLET | Freq: Four times a day (QID) | ORAL | Status: DC | PRN
Start: 2015-01-15 — End: 2015-01-15

## 2015-01-15 MED ORDER — PIPERACILLIN-TAZOBACTAM 3.375 G IVPB
3.3750 g | Freq: Three times a day (TID) | INTRAVENOUS | Status: DC
  Administered 2015-01-16 – 2015-01-20 (×12): 3.375 g via INTRAVENOUS
  Filled 2015-01-15 (×19): qty 50

## 2015-01-15 MED ORDER — PIPERACILLIN-TAZOBACTAM 3.375 G IVPB 30 MIN
3.3750 g | Freq: Once | INTRAVENOUS | Status: AC
  Administered 2015-01-15: 3.375 g via INTRAVENOUS

## 2015-01-15 MED ORDER — ONDANSETRON HCL 4 MG/2ML IJ SOLN
4.0000 mg | Freq: Once | INTRAMUSCULAR | Status: AC | PRN
  Administered 2015-01-20: 4 mg via INTRAVENOUS
  Filled 2015-01-15: qty 2

## 2015-01-15 MED ORDER — CHLORHEXIDINE GLUCONATE CLOTH 2 % EX PADS
6.0000 | MEDICATED_PAD | Freq: Once | CUTANEOUS | Status: AC
  Administered 2015-01-15: 6 via TOPICAL

## 2015-01-15 MED ORDER — AMIODARONE HCL 200 MG PO TABS
200.0000 mg | ORAL_TABLET | Freq: Two times a day (BID) | ORAL | Status: DC
  Administered 2015-01-16 – 2015-01-20 (×10): 200 mg via ORAL
  Filled 2015-01-15 (×11): qty 1

## 2015-01-15 MED ORDER — ALLOPURINOL 100 MG PO TABS
100.0000 mg | ORAL_TABLET | Freq: Every day | ORAL | Status: DC
  Administered 2015-01-16 – 2015-01-20 (×5): 100 mg via ORAL
  Filled 2015-01-15 (×6): qty 1

## 2015-01-15 MED ORDER — TORSEMIDE 20 MG PO TABS
60.0000 mg | ORAL_TABLET | ORAL | Status: DC
  Administered 2015-01-16 – 2015-01-18 (×3): 60 mg via ORAL
  Filled 2015-01-15 (×3): qty 3

## 2015-01-15 MED ORDER — DIPHENHYDRAMINE HCL 25 MG PO CAPS
50.0000 mg | ORAL_CAPSULE | Freq: Every day | ORAL | Status: DC | PRN
  Administered 2015-01-16: 25 mg via ORAL
  Administered 2015-01-17: 50 mg via ORAL
  Filled 2015-01-15 (×2): qty 2

## 2015-01-15 MED ORDER — VANCOMYCIN HCL 10 G IV SOLR
1250.0000 mg | INTRAVENOUS | Status: DC
  Administered 2015-01-16 – 2015-01-17 (×2): 1250 mg via INTRAVENOUS
  Filled 2015-01-15 (×3): qty 1250

## 2015-01-15 MED ORDER — GABAPENTIN 300 MG PO CAPS
300.0000 mg | ORAL_CAPSULE | Freq: Every day | ORAL | Status: DC
  Administered 2015-01-16 – 2015-01-19 (×5): 300 mg via ORAL
  Filled 2015-01-15 (×5): qty 1

## 2015-01-15 MED ORDER — ACETAMINOPHEN 325 MG PO TABS: 650.0000 mg | ORAL_TABLET | ORAL | Status: DC | PRN

## 2015-01-15 MED ORDER — POTASSIUM CHLORIDE ER 10 MEQ PO TBCR
40.0000 meq | EXTENDED_RELEASE_TABLET | Freq: Two times a day (BID) | ORAL | Status: DC
  Administered 2015-01-16 – 2015-01-17 (×4): 40 meq via ORAL
  Filled 2015-01-15 (×11): qty 4

## 2015-01-15 MED ORDER — FLUTICASONE PROPIONATE 50 MCG/ACT NA SUSP: 1.0000 | Freq: Every day | NASAL | Status: DC | PRN

## 2015-01-15 NOTE — Telephone Encounter (Signed)
Cancel blood cultures patient should report to ER for further evaluation per Dr.Bensimhon/ Rebecca Eaton  patient aware and voiced understanding Detailed message left for Arrie Senate, RN with plan [phone]

## 2015-01-15 NOTE — Progress Notes (Signed)
ANTIBIOTIC CONSULT NOTE - INITIAL  Pharmacy Consult for vanc/zosyn Indication: line infection, r/o sepsis  Allergies  Allergen Reactions  . Nitroglycerin Other (See Comments)    "feels like head is going to bust open"  . Colchicine Nausea Only    Patient Measurements: Height: 6\' 4"  (193 cm) Weight: 229 lb 9 oz (104.129 kg) IBW/kg (Calculated) : 86.8 Adjusted Body Weight:   Vital Signs: Temp: 98.7 F (37.1 C) (01/13 2117) Temp Source: Oral (01/13 2117) BP: 110/78 mmHg (01/13 2119) Pulse Rate: 107 (01/13 2045) Intake/Output from previous day:   Intake/Output from this shift:    Labs:  Recent Labs  01/13/15 0932 01/27/2015 1622  WBC 9.0 6.2  HGB 11.7* 11.8*  PLT 298.0 276  CREATININE  --  1.72*   Estimated Creatinine Clearance: 52.6 mL/min (by C-G formula based on Cr of 1.72). No results for input(s): VANCOTROUGH, VANCOPEAK, VANCORANDOM, GENTTROUGH, GENTPEAK, GENTRANDOM, TOBRATROUGH, TOBRAPEAK, TOBRARND, AMIKACINPEAK, AMIKACINTROU, AMIKACIN in the last 72 hours.   Microbiology: No results found for this or any previous visit (from the past 720 hour(s)).  Medical History: Past Medical History  Diagnosis Date  . CHF (congestive heart failure) ([place])   . Sarcoidosis ([place])   . Cardiomyopathy, dilated, nonischemic ([place])     non ischemic by cath  . Acute on chronic systolic heart failure ([place])   . Automatic implantable cardiac defibrillator in situ   . Atrial fibrillation ([place])   . NSVT (nonsustained ventricular tachycardia) ([place])   . GERD (gastroesophageal reflux disease)   . Hypercholesteremia   . Shortness of breath   . Pacemaker   . Anginal pain ([place])   . Gout   . Hypertension     dr [name]  . Coronary artery disease   . Elevated PSA 06/24/2013  . Myocardial infarction ([place])   . Headache   . Neuropathy ([place])   . Stroke ([place]) 08/2014  . Anxiety   . Chronic kidney disease (CKD), stage III (moderate)     colodonato  . On supplemental oxygen therapy    oxygen 2.5 l/m nasally -as needed- not using at present time.    Assessment: 53 yom with multiple hospital admissions for HF exacerbations - on home milrinone. PICC line appears infected per MD notes. Pharmacy consulted to dose vanc/zosyn for r/o sepsis, line infection. Afeb, wbc wnl. SCr 1.72 on admit, normalized CrCl~43  1/13 vanc>> 1/13 zosyn>>  1/13 BCx3>> 1/13 UC>>  Goal of Therapy:  Vancomycin trough level 15-20 mcg/ml  Plan:  Zosyn 3.375g IV (46min inf); then Zosyn 3.375g IV q8h Vanc 2g IV x1; then Vanc 1250mg  IV q24h Monitor clinical progress, c/s, renal function, abx plan/LOT VT@SS  as indicated  Elicia Lamp, PharmD, [facility] Clinical Pharmacist Pager [phone] 01/20/2015 9:33 PM

## 2015-01-15 NOTE — Telephone Encounter (Signed)
Dr Ardis Hughs- You indicated that you would like patient to hold Eliquis x 2 days prior to endoscopy. Dr Haroldine Laws states that it is typically okay to hold Eliquis x only 1 day for endoscopy but okay to hold for 2 days if needed. Do you still want me to have patient to hold x 2 days or just the 1 as Dr Haroldine Laws indicated?

## 2015-01-15 NOTE — ED Notes (Signed)
Pt c/o 3 day history generalized CP and SOB. His home health nurse came today and was concerned about his CP and SOB along with redness and swelling around his PICC line, she sent him to ED for evaluation. He has PICC for milrinone drip. States hes had chills this week. hes A&Ox4, mild labored breathing

## 2015-01-15 NOTE — Telephone Encounter (Signed)
Patient advised that per Dr Haroldine Laws and Dr Ardis Hughs, he may hold Eliquis 1 day prior to procedure. He verbalizes understanding.

## 2015-01-15 NOTE — ED Notes (Signed)
Pt requested a Coke to drink, ok by [employer] pt given the same

## 2015-01-15 NOTE — ED Notes (Signed)
Patient transported to X-ray 

## 2015-01-15 NOTE — Telephone Encounter (Signed)
One day is fine

## 2015-01-15 NOTE — ED Notes (Signed)
IV team at bedside 

## 2015-01-15 NOTE — Consult Note (Signed)
Cardiologist:  [place] Reason for Consult: Chest Pain Referring Physician: Dorie Rank MD  [name] is an 65 y.o. male.  HPI:  [name] is a 65 y.o. 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 He also has history of ventricular tachycardia s/p St Jude ICD (single lead, no Corevue) by Dr. Lovena Le, CKD stage IV, and paroxysmal atrial arrhythmias on amiodarone as well as sarcoidosis and hypertension. Cath in 2008 by Dr. Terrence Dupont showed no CAD. Seen at [facility] and not felt to be a transplant candidate due to renal failure and lack of family support.  Milrinone initiated in January 2014 for low output. 03/01/12 milrinone increased 0.375 mcg/kg/min.   04/26/12 S/P Successful DC-CV for atrial fibrillation. Remains on amiodarone.   Admitted to [facility] 01/09/14-01/15/2014 with an infected PICC. Gram negative bacteremia with antibiotic course. Hospitalization was complicated by VT/VF. Discharge weight was 240 pounds.   He was admitted to the hospital in [facility] for 3 weeks in 8/16. He was initially admitted with chest pain, dyspnea, and abdominal pain. His wife states he had a cardiac cath in his arteries were clear. However, while he was on the table he suffered a CVA.  At this point he feels recovered from the stroke. His stay was complicated by delirium with hallucinations. He had hematuria and Eliquis was stopped and restarted later. He says they told him that he had a bladder infection. By the end of his hospitalization, he was very debilitated and barely able to walk. He is now back at home.   He remains on Milrinone 0.375 mcg/kg/min. Evaluated on 12/1 in the ED for chest and neck discomfort. He ws turned away from pain clinic because he broke the pain contract. He was later sent home with follow up with PCP. He has been referred to a new pain specialist.   The patient presents with right-sided chest pain  around PICC line. Describes as shooting.  He says it is very tender above the PICC line above and around the clavicle.   It's been progressively getting worse over the last 1.5 days. He's had a low-grade temperature(99.8) today. He's also had some chills, shortness of breath, cough, congestion and generalized weakness. His weight today at home is 229 pounds, which is up 5 pounds.   He also has chronic epigastric pain and yesterday underwent a gastric emptying scan.  Had some dark stools but apparently last admission he was heme-negative.   His gastroenterologist is planning to do an EGD.  The patient currently denies nausea, vomiting, orthopnea, dizziness, PND,  hematochezia, melena.     Past Medical History  Diagnosis Date  . CHF (congestive heart failure) ([place])   . Sarcoidosis ([place])   . Cardiomyopathy, dilated, nonischemic ([place])     non ischemic by cath  . Acute on chronic systolic heart failure ([place])   . Automatic implantable cardiac defibrillator in situ   . Atrial fibrillation ([place])   . NSVT (nonsustained ventricular tachycardia) ([place])   . GERD (gastroesophageal reflux disease)   . Hypercholesteremia   . Shortness of breath   . Pacemaker   . Anginal pain ([place])   . Gout   . Hypertension     dr [name]  . Coronary artery disease   . Elevated PSA 06/24/2013  . Myocardial infarction ([place])   . Headache   . Neuropathy ([place])   . Stroke ([place]) 08/2014  . Anxiety   . Chronic kidney  disease (CKD), stage III (moderate)     colodonato  . On supplemental oxygen therapy     oxygen 2.5 l/m nasally -as needed- not using at present time.    Past Surgical History  Procedure Laterality Date  . Pacemaker insertion  2009    with ICD  . Tee without cardioversion  01/17/2011    Procedure: TRANSESOPHAGEAL ECHOCARDIOGRAM (TEE);  Surgeon: Birdie Riddle, MD;  Location: [place];  Service: Cardiovascular;  Laterality: N/A;  . Cardioversion  01/17/2011    Procedure: CARDIOVERSION;  Surgeon: Birdie Riddle, MD;  Location: [place];  Service: Cardiovascular;  Laterality: N/A;  . Cardiac catheterization    . Insert / replace / remove pacemaker    . Anterior cervical decomp/discectomy fusion  08/21/2011    Procedure: ANTERIOR CERVICAL DECOMPRESSION/DISCECTOMY FUSION 2 LEVELS;  Surgeon: Marybelle Killings, MD;  Location: [place];  Service: Orthopedics;  Laterality: N/A;  C5-6, C6-7 Anterior Cervical Discectomy and Fusion, allograft, plate  . Tee without cardioversion N/A 02/16/2012    Procedure: TRANSESOPHAGEAL ECHOCARDIOGRAM (TEE);  Surgeon: Jolaine Artist, MD;  Location: [facility] ENDOSCOPY;  Service: Cardiovascular;  Laterality: N/A;  original case scheduled under his dad (who is deceased), rescheduled under correct mrn/pt/dob. [place]/dl  . Tee without cardioversion N/A 03/22/2012    Procedure: TRANSESOPHAGEAL ECHOCARDIOGRAM (TEE);  Surgeon: Lelon Perla, MD;  Location: [place];  Service: Cardiovascular;  Laterality: N/A;  . Cardioversion N/A 03/22/2012    Procedure: CARDIOVERSION;  Surgeon: Lelon Perla, MD;  Location: [facility] ENDOSCOPY;  Service: Cardiovascular;  Laterality: N/A;  . Cardioversion N/A 04/26/2012    Procedure: CARDIOVERSION;  Surgeon: Jolaine Artist, MD;  Location: [facility] ENDOSCOPY;  Service: Cardiovascular;  Laterality: N/A;  . Central venous catheter tunneled insertion single lumen  09/16/2012    right IJ  . Cardioversion N/A 11/08/2012    Procedure: CARDIOVERSION;  Surgeon: Jolaine Artist, MD;  Location: [facility] ENDOSCOPY;  Service: Cardiovascular;  Laterality: N/A;  . Back surgery  1987, lower    Ruptured disk repair  . Peripherally inserted central catheter insertion  2013  . Right heart catheterization N/A 01/26/2012    Procedure: RIGHT HEART CATH;  Surgeon: Jolaine Artist, MD;  Location: [facility] CATH LAB;  Service: Cardiovascular;  Laterality: N/A;  . Right heart catheterization N/A 04/30/2013    Procedure: RIGHT HEART CATH;  Surgeon: Jolaine Artist, MD;  Location: [facility] CATH  LAB;  Service: Cardiovascular;  Laterality: N/A;    Family History  Problem Relation Age of Onset  . Heart disease    . Heart failure    . Stroke    . Anesthesia problems Neg Hx   . Hypotension Neg Hx   . Malignant hyperthermia Neg Hx   . Pseudochol deficiency Neg Hx   . Heart failure Sister   . Heart attack Sister   . Hyperlipidemia Mother   . Colon cancer Father     dx in his early 27's    Social History:  reports that he has never smoked. He has never used smokeless tobacco. He reports that he does not drink alcohol or use illicit drugs.  Allergies:  Allergies  Allergen Reactions  . Nitroglycerin Other (See Comments)    "feels like head is going to bust open"  . Colchicine Nausea Only    Medications:  Medication Sig  allopurinol (ZYLOPRIM) 100 MG tablet Take 1 tablet (100 mg total) by mouth daily.  ALPRAZolam (XANAX) 0.5 MG tablet Take 1 tablet (0.5  mg total) by mouth 3 (three) times daily as needed for anxiety.  apixaban (ELIQUIS) 5 MG TABS tablet Take 1 tablet (5 mg total) by mouth 2 (two) times daily.  atorvastatin (LIPITOR) 80 MG tablet Take 1 tablet (80 mg total) by mouth daily.  carvedilol (COREG) 6.25 MG tablet TAKE 1 TABLET (6.25 MG TOTAL) BY MOUTH 2 (TWO) TIMES DAILY WITH A MEAL.  dexlansoprazole (DEXILANT) 60 MG capsule Take 1 capsule (60 mg total) by mouth daily. 30 min prior to breakfast  diphenhydrAMINE (BENADRYL) 50 MG capsule Take 50 mg by mouth daily as needed for itching.  eplerenone (INSPRA) 25 MG tablet Take 12.5 mg by mouth daily.   fluticasone (FLONASE) 50 MCG/ACT nasal spray PLACE 2 SPRAYS INTO BOTH NOSTRILS DAILY AS NEEDED FOR ALLERGIES.  gabapentin (NEURONTIN) 300 MG capsule Take 300 mg by mouth at bedtime.  metolazone (ZAROXOLYN) 2.5 MG tablet Take 1 tablet (2.5 mg total) by mouth daily as needed (for fluid retention).  milrinone (PRIMACOR) 20 MG/100ML SOLN infusion Inject 0.375 mcg/kg/min into the vein continuous. '440mg'$  in 447m infusion 2.4 ml  per hour  ondansetron (ZOFRAN) 4 MG tablet Take 1 tablet (4 mg total) by mouth every 8 (eight) hours as needed for nausea or vomiting.  oxyCODONE-acetaminophen (PERCOCET) 10-325 MG tablet Take 1 tablet by mouth every 6 (six) hours as needed for pain.   potassium chloride 20 MEQ TBCR Take 40 mEq by mouth 2 (two) times daily. Patient taking differently: Take 20 mEq by mouth 3 (three) times daily.   ranitidine (ZANTAC) 150 MG capsule Take 1 capsule (150 mg total) by mouth 2 (two) times daily.  senna-docusate (SENOKOT-S) 8.6-50 MG per tablet Take 1-2 tablets by mouth 2 (two) times daily as needed for mild constipation or moderate constipation.  sertraline (ZOLOFT) 100 MG tablet Take 1 tablet (100 mg total) by mouth daily.  torsemide (DEMADEX) 20 MG tablet Take 40-60 mg by mouth 2 (two) times daily. Takes 3 tablets in the morning and 2 tablets in the evening.  zolpidem (AMBIEN) 5 MG tablet TAKE 1 AND 1/2 TABLET BY MOUTH EVERY DAY AT BEDTIME     Results for orders placed or performed during the hospital encounter of 01/07/2015 (from the past 48 hour(s))  Basic metabolic panel     Status: Abnormal   Collection Time: 01/30/2015  4:22 PM  Result Value Ref Range   Sodium 141 135 - 145 mmol/L   Potassium 3.2 (L) 3.5 - 5.1 mmol/L   Chloride 103 101 - 111 mmol/L   CO2 25 22 - 32 mmol/L   Glucose, Bld 93 65 - 99 mg/dL   BUN 17 6 - 20 mg/dL   Creatinine, Ser 1.72 (H) 0.61 - 1.24 mg/dL   Calcium 9.7 8.9 - 10.3 mg/dL   GFR calc non Af Amer 40 (L) >60 mL/min   GFR calc Af Amer 46 (L) >60 mL/min    Comment: (NOTE) The eGFR has been calculated using the CKD EPI equation. This calculation has not been validated in all clinical situations. eGFR's persistently <60 mL/min signify possible Chronic Kidney Disease.    Anion gap 13 5 - 15  CBC     Status: Abnormal   Collection Time: 01/08/2015  4:22 PM  Result Value Ref Range   WBC 6.2 4.0 - 10.5 K/uL   RBC 4.07 (L) 4.22 - 5.81 MIL/uL   Hemoglobin 11.8 (L) 13.0  - 17.0 g/dL   HCT 36.5 (L) 39.0 - 52.0 %   MCV 89.7  78.0 - 100.0 fL   MCH 29.0 26.0 - 34.0 pg   MCHC 32.3 30.0 - 36.0 g/dL   RDW 14.5 11.5 - 15.5 %   Platelets 276 150 - 400 K/uL  Differential     Status: None   Collection Time: 01/28/2015  4:22 PM  Result Value Ref Range   Neutrophils Relative % 64 %   Neutro Abs 3.8 1.7 - 7.7 K/uL   Lymphocytes Relative 26 %   Lymphs Abs 1.5 0.7 - 4.0 K/uL   Monocytes Relative 8 %   Monocytes Absolute 0.5 0.1 - 1.0 K/uL   Eosinophils Relative 2 %   Eosinophils Absolute 0.1 0.0 - 0.7 K/uL   Basophils Relative 0 %   Basophils Absolute 0.0 0.0 - 0.1 K/uL  I-stat troponin, ED (not at [facility])     Status: Abnormal   Collection Time: 01/19/2015  4:37 PM  Result Value Ref Range   Troponin i, poc 0.11 (HH) 0.00 - 0.08 ng/mL   Comment NOTIFIED PHYSICIAN    Comment 3            Comment: Due to the release kinetics of cTnI, a negative result within the first hours of the onset of symptoms does not rule out myocardial infarction with certainty. If myocardial infarction is still suspected, repeat the test at appropriate intervals.   I-Stat CG4 Lactic Acid, ED (Not at [facility])     Status: None   Collection Time: 01/06/2015  4:39 PM  Result Value Ref Range   Lactic Acid, Venous 1.86 0.5 - 2.0 mmol/L  Urinalysis, Routine w reflex microscopic (not at [facility])     Status: Abnormal   Collection Time: 01/27/2015  6:12 PM  Result Value Ref Range   Color, Urine YELLOW YELLOW   APPearance CLEAR CLEAR   Specific Gravity, Urine 1.006 1.005 - 1.030   pH 5.5 5.0 - 8.0   Glucose, UA NEGATIVE NEGATIVE mg/dL   Hgb urine dipstick NEGATIVE NEGATIVE   Bilirubin Urine NEGATIVE NEGATIVE   Ketones, ur NEGATIVE NEGATIVE mg/dL   Protein, ur 30 (A) NEGATIVE mg/dL   Nitrite NEGATIVE NEGATIVE   Leukocytes, UA NEGATIVE NEGATIVE  Urine microscopic-add on     Status: Abnormal   Collection Time: 01/27/2015  6:12 PM  Result Value Ref Range   Squamous Epithelial / LPF 0-5 (A) NONE SEEN     WBC, UA 0-5 0 - 5 WBC/hpf   RBC / HPF NONE SEEN 0 - 5 RBC/hpf   Bacteria, UA RARE (A) NONE SEEN   Casts HYALINE CASTS (A) NEGATIVE  Brain natriuretic peptide     Status: Abnormal   Collection Time: 01/05/2015  7:16 PM  Result Value Ref Range   B Natriuretic Peptide 700.0 (H) 0.0 - 100.0 pg/mL  Hepatic function panel     Status: Abnormal   Collection Time: 01/27/2015  7:16 PM  Result Value Ref Range   Total Protein 7.2 6.5 - 8.1 g/dL   Albumin 2.8 (L) 3.5 - 5.0 g/dL   AST 31 15 - 41 U/L   ALT 22 17 - 63 U/L   Alkaline Phosphatase 65 38 - 126 U/L   Total Bilirubin 1.4 (H) 0.3 - 1.2 mg/dL   Bilirubin, Direct 0.5 0.1 - 0.5 mg/dL   Indirect Bilirubin 0.9 0.3 - 0.9 mg/dL  Lipase, blood     Status: None   Collection Time: 01/13/2015  7:16 PM  Result Value Ref Range   Lipase 27 11 - 51 U/L  I-Stat CG4 Lactic Acid, ED (Not at [facility])     Status: None   Collection Time: 01/07/2015  7:23 PM  Result Value Ref Range   Lactic Acid, Venous 1.37 0.5 - 2.0 mmol/L    Dg Chest 2 View  01/19/2015  CLINICAL DATA:  Chest pain for 2 weeks. EXAM: CHEST  2 VIEW COMPARISON:  December 03, 2014. FINDINGS: Stable mild cardiomegaly is noted. Left-sided pacemaker is unchanged in position. Right internal jugular PICC line is also noted with distal tip in expected position of the SVC. No pneumothorax or pleural effusion is noted. No acute pulmonary disease is noted. Bony thorax is unremarkable. IMPRESSION: No active cardiopulmonary disease. Electronically Signed   By: Marijo Conception, M.D.   On: 01/24/2015 17:48   Nm Gastric Emptying  01/14/2015  CLINICAL DATA:  Early satiety associated with abdominal chest pain with nausea vomiting bloating and reflux. EXAM: NUCLEAR MEDICINE GASTRIC EMPTYING SCAN TECHNIQUE: After oral ingestion of radiolabeled meal, sequential abdominal images were obtained for 4 hours. Percentage of activity emptying the stomach was calculated at 1 hour, 2 hour, 3 hour, and 4 hours. RADIOPHARMACEUTICALS:   2.0 MCi Tc-50mMDP labeled sulfur colloid orally in 2 egg whites with 1 piece of toast and 6 ounces of water. COMPARISON:  Abdominal ultrasound of January 08, 2015 FINDINGS: Expected location of the stomach in the left upper quadrant. Ingested meal empties the stomach gradually over the course of the study. Sixty-five percent emptied at 1 hr ( normal >= 10%) Seventy-seven percent emptied at 2 hr ( normal >= 40%) 95% emptied at 3 hr ( normal >= 70%) IMPRESSION: Normal. Gastric emptying study. Electronically Signed   By: David  JMartiniqueM.D.   On: 01/14/2015 10:47    Review of Systems  Constitutional: Positive for fever and chills. Negative for diaphoresis.  HENT: Positive for congestion. Negative for sore throat.   Respiratory: Positive for cough and shortness of breath.   Cardiovascular: Positive for chest pain and leg swelling. Negative for orthopnea and PND.  Gastrointestinal: Positive for abdominal pain ( epigastric). Negative for nausea, vomiting, blood in stool and melena.  Neurological: Positive for weakness. Negative for dizziness.  All other systems reviewed and are negative.  Blood pressure 100/82, pulse 100, temperature 99.5 F (37.5 C), temperature source Oral, resp. rate 18, height '6\' 4"'$  (1.93 m), weight 229 lb 9 oz (104.129 kg), SpO2 99 %. Physical Exam  Nursing note and vitals reviewed. Constitutional: He is oriented to person, place, and time. He appears well-developed and well-nourished.  He's not in distress. He appears uncomfortable.  HENT:  Head: Normocephalic and atraumatic.  Mouth/Throat: No oropharyngeal exudate.  Eyes: EOM are normal. Pupils are equal, round, and reactive to light. No scleral icterus.  Neck: Normal range of motion. Neck supple. No JVD present.  Cardiovascular: S1 normal and S2 normal.  An irregularly irregular rhythm present. Tachycardia present.   No murmur heard. Pulses:      Radial pulses are 2+ on the right side, and 2+ on the left side.        Dorsalis pedis pulses are 2+ on the right side, and 2+ on the left side.  Respiratory: Effort normal and breath sounds normal. He has no wheezes. He has no rales. He exhibits tenderness.  GI: Soft. Bowel sounds are normal. He exhibits distension. There is tenderness (epigastric area).  Musculoskeletal: He exhibits edema (Trace lower extremity edema) and tenderness (He is very tender of around the right clavicle.).  Lymphadenopathy:  He has no cervical adenopathy.  Neurological: He is alert and oriented to person, place, and time.  Skin: Skin is warm and dry. There is erythema (Around PICC line and around the right clavicle which is very tender).  Psychiatric: He has a normal mood and affect.    Assessment/Plan: Active Problems:   Elevated troponin   Essential hypertension   Automatic implantable cardioverter-defibrillator in situ   Chronic systolic CHF (congestive heart failure), NYHA class 3-4   Cardiomyopathy, dilated, nonischemic (HCC)   Atrial fibrillation (HCC)   Abdominal pain, chronic, epigastric   Chest pain:  Right-sided around PICC line   Hypokalemia   65 y.o. 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 He also has history of ventricular tachycardia s/p St Jude ICD (single lead, no Corevue) by Dr. Lovena Le, CKD stage IV, and paroxysmal atrial arrhythmias on amiodarone as well as sarcoidosis and hypertension. Cath in 2008 by Dr. Terrence Dupont showed no CAD. Seen at [facility] and not felt to be a transplant candidate due to renal failure and lack of family support.  Milrinone initiated in January 2014 for low output.  Presents with right-sided chest pain. It appears that his PICC line is infected. There is erythema around it above near the clavicle. He is in atrial fibrillation as well with a heart rate around 100. I suspect any elevation in heart rate is related to pain. He is taking amiodarone 400 mg twice a  day however, it is not on his med list. This will need to be corrected.  We'll need to continue this however, I would restart at 200 mg daily.  Continue Coreg.  He apparently had a heart catheterization in August and his wife said his coronary arteries were clear. It may be beneficial to get the records.  Troponin is mildly elevated. This could be from heart failure, afib or systemic infection.  He does not appear grossly volume overloaded and CXR shows nothing acute.  Continue home torsemide dose.  His PICC line does appear infected(Very tender over the clavicle).  Blood cultures were drawn.  BP on the low side.   Replace K.    Tarri Fuller, [facility] 01/18/2015, 8:29 PM   Attending NOTe  Patient seen and discussed with PA Samara Snide, I agree with his documentation. 65 yo male with chronic systolic HF/NICM LVEF 26-94% by echo 01/2014 on home milrinone, chronic chest pain with multiple negative caths most recently 08/2014 during admission to hospital in [facility] (per last CHF clinic note, difficulty finding on epic). From notes he has been referred to a chronic pain specialist. Admitted with pain at PICC site as well as separate chest pressure like pain. EKG no specific ischemic changes, initial POCT troponin 0.11 nonspecific in setting of chronic systolic HF. Given long history of chronic chest pain with negative workup, unless new strong objective evidence of ischemia would not pursue repeat cardiac testing. He is exquisitely tender at his PICC line site and this is the most likely cause of his pain, we will ask for admission to medicine service to help manage this likely picc line infection. Continue home CHF medications including his home milrinone.    Carlyle Dolly MD

## 2015-01-15 NOTE — Telephone Encounter (Signed)
Patty RN with [name] called with concerns during [facility] visit today Patient reports PICC site has been painful for a few days PICC site is red, puffy, and tender Patient denies fever however has had chills for a few days- oral temp 98.9  Per vo Oda [place], [place] Will need blood cultures x 2

## 2015-01-15 NOTE — ED Notes (Signed)
Attempted report at this time.  Nurse to call back when available. 

## 2015-01-15 NOTE — ED Provider Notes (Signed)
CSN: [id number]     Arrival date & time 01/04/2015  47 History   First MD Initiated Contact with Patient 02/01/2015 1640     Chief Complaint  Patient presents with  . Chest Pain     (Consider location/radiation/quality/duration/timing/severity/associated sxs/prior Treatment) HPI   [name] is a 65 y.o. male, with a history of severe heart failure with EF of 20-25%, Afib, GERD, MI, CVA, CKD stage III, and hypertension, presenting to the ED with chest pain and shortness of breath for the last 2-3 days. Pt describes the pain as sharp, located in his right chest around his PICC line, rates it 8/10, radiating outward from the PICC sight. Patient describes the pain in the center of his chest is more of a pressure, but still rates it 8 out of 10. Pt states he has the PICC line for a milrinone drip. Pt states he has had the line for 3 years and it was last changed a year ago due to a "heart infection." Pt endorses subjective fever with chills. Pt also endorses a productive cough for the past week with yellow sputum. Pt denies N/V, recent surgeries, abdominal pain, or any other pain or complaints.    Past Medical History  Diagnosis Date  . CHF (congestive heart failure) ([place])   . Sarcoidosis ([place])   . Cardiomyopathy, dilated, nonischemic ([place])     non ischemic by cath  . Acute on chronic systolic heart failure ([place])   . Automatic implantable cardiac defibrillator in situ   . Atrial fibrillation ([place])   . NSVT (nonsustained ventricular tachycardia) ([place])   . GERD (gastroesophageal reflux disease)   . Hypercholesteremia   . Shortness of breath   . Pacemaker   . Anginal pain ([place])   . Gout   . Hypertension     dr [name]  . Coronary artery disease   . Elevated PSA 06/24/2013  . Myocardial infarction ([place])   . Headache   . Neuropathy ([place])   . Stroke ([place]) 08/2014  . Anxiety   . Chronic kidney disease (CKD), stage III (moderate)     colodonato  . On supplemental oxygen therapy     oxygen 2.5 l/m nasally -as needed- not using at present time.   Past Surgical History  Procedure Laterality Date  . Pacemaker insertion  2009    with ICD  . Tee without cardioversion  01/17/2011    Procedure: TRANSESOPHAGEAL ECHOCARDIOGRAM (TEE);  Surgeon: Birdie Riddle, MD;  Location: [place];  Service: Cardiovascular;  Laterality: N/A;  . Cardioversion  01/17/2011    Procedure: CARDIOVERSION;  Surgeon: Birdie Riddle, MD;  Location: [place];  Service: Cardiovascular;  Laterality: N/A;  . Cardiac catheterization    . Insert / replace / remove pacemaker    . Anterior cervical decomp/discectomy fusion  08/21/2011    Procedure: ANTERIOR CERVICAL DECOMPRESSION/DISCECTOMY FUSION 2 LEVELS;  Surgeon: Marybelle Killings, MD;  Location: [place];  Service: Orthopedics;  Laterality: N/A;  C5-6, C6-7 Anterior Cervical Discectomy and Fusion, allograft, plate  . Tee without cardioversion N/A 02/16/2012    Procedure: TRANSESOPHAGEAL ECHOCARDIOGRAM (TEE);  Surgeon: Jolaine Artist, MD;  Location: [facility] ENDOSCOPY;  Service: Cardiovascular;  Laterality: N/A;  original case scheduled under his dad (who is deceased), rescheduled under correct mrn/pt/dob. [place]/dl  . Tee without cardioversion N/A 03/22/2012    Procedure: TRANSESOPHAGEAL ECHOCARDIOGRAM (TEE);  Surgeon: Lelon Perla, MD;  Location: [facility] ENDOSCOPY;  Service: Cardiovascular;  Laterality: N/A;  . Cardioversion N/A  03/22/2012    Procedure: CARDIOVERSION;  Surgeon: Lelon Perla, MD;  Location: [facility] ENDOSCOPY;  Service: Cardiovascular;  Laterality: N/A;  . Cardioversion N/A 04/26/2012    Procedure: CARDIOVERSION;  Surgeon: Jolaine Artist, MD;  Location: [facility] ENDOSCOPY;  Service: Cardiovascular;  Laterality: N/A;  . Central venous catheter tunneled insertion single lumen  09/16/2012    right IJ  . Cardioversion N/A 11/08/2012    Procedure: CARDIOVERSION;  Surgeon: Jolaine Artist, MD;  Location: [facility] ENDOSCOPY;  Service: Cardiovascular;  Laterality:  N/A;  . Back surgery  1987, lower    Ruptured disk repair  . Peripherally inserted central catheter insertion  2013  . Right heart catheterization N/A 01/26/2012    Procedure: RIGHT HEART CATH;  Surgeon: Jolaine Artist, MD;  Location: [facility] CATH LAB;  Service: Cardiovascular;  Laterality: N/A;  . Right heart catheterization N/A 04/30/2013    Procedure: RIGHT HEART CATH;  Surgeon: Jolaine Artist, MD;  Location: [facility] CATH LAB;  Service: Cardiovascular;  Laterality: N/A;   Family History  Problem Relation Age of Onset  . Heart disease    . Heart failure    . Stroke    . Anesthesia problems Neg Hx   . Hypotension Neg Hx   . Malignant hyperthermia Neg Hx   . Pseudochol deficiency Neg Hx   . Heart failure Sister   . Heart attack Sister   . Hyperlipidemia Mother   . Colon cancer Father     dx in his early 25's   Social History  Substance Use Topics  . Smoking status: Never Smoker   . Smokeless tobacco: Never Used  . Alcohol Use: No    Review of Systems  Constitutional: Positive for fever and chills. Negative for diaphoresis.  Respiratory: Positive for cough and shortness of breath.   Cardiovascular: Positive for chest pain.  Gastrointestinal: Negative for nausea, vomiting and abdominal pain.  Skin: Positive for color change (Redness and tenderness surrounding PICC site). Negative for pallor.  Neurological: Negative for dizziness, light-headedness and headaches.  All other systems reviewed and are negative.     Allergies  Nitroglycerin and Colchicine  Home Medications   Prior to Admission medications   Medication Sig Start Date End Date Taking? Authorizing Provider  allopurinol (ZYLOPRIM) 100 MG tablet Take 1 tablet (100 mg total) by mouth daily. 02/05/12  Yes Wynetta Emery, PA-C  ALPRAZolam Duanne Moron) 0.5 MG tablet Take 1 tablet (0.5 mg total) by mouth 3 (three) times daily as needed for anxiety. 12/04/14  Yes Debbrah Alar, NP  apixaban (ELIQUIS) 5 MG TABS tablet Take  1 tablet (5 mg total) by mouth 2 (two) times daily. 09/24/14  Yes Amy D Clegg, NP  atorvastatin (LIPITOR) 80 MG tablet Take 1 tablet (80 mg total) by mouth daily. 10/17/14  Yes Debbrah Alar, NP  carvedilol (COREG) 6.25 MG tablet TAKE 1 TABLET (6.25 MG TOTAL) BY MOUTH 2 (TWO) TIMES DAILY WITH A MEAL. 08/20/14  Yes Larey Dresser, MD  dexlansoprazole (DEXILANT) 60 MG capsule Take 1 capsule (60 mg total) by mouth daily. 30 min prior to breakfast 12/24/14  Yes Lori P Hvozdovic, PA-C  diphenhydrAMINE (BENADRYL) 50 MG capsule Take 50 mg by mouth daily as needed for itching.   Yes Historical Provider, MD  eplerenone (INSPRA) 25 MG tablet Take 12.5 mg by mouth daily.  09/11/13  Yes Rande Brunt, NP  fluticasone (FLONASE) 50 MCG/ACT nasal spray PLACE 2 SPRAYS INTO BOTH NOSTRILS DAILY AS NEEDED FOR ALLERGIES.  01/06/15  Yes Debbrah Alar, NP  gabapentin (NEURONTIN) 300 MG capsule Take 300 mg by mouth at bedtime.   Yes Historical Provider, MD  metolazone (ZAROXOLYN) 2.5 MG tablet Take 1 tablet (2.5 mg total) by mouth daily as needed (for fluid retention). 12/14/14  Yes Debbrah Alar, NP  milrinone (PRIMACOR) 20 MG/100ML SOLN infusion Inject 0.375 mcg/kg/min into the vein continuous. 440mg  in 482ml infusion 2.4 ml per hour   Yes Historical Provider, MD  ondansetron (ZOFRAN) 4 MG tablet Take 1 tablet (4 mg total) by mouth every 8 (eight) hours as needed for nausea or vomiting. 12/24/14  Yes Lori P Hvozdovic, PA-C  oxyCODONE-acetaminophen (PERCOCET) 10-325 MG tablet Take 1 tablet by mouth every 6 (six) hours as needed for pain.  12/07/14  Yes Historical Provider, MD  potassium chloride 20 MEQ TBCR Take 40 mEq by mouth 2 (two) times daily. Patient taking differently: Take 20 mEq by mouth 3 (three) times daily.  12/17/14  Yes Amy D Clegg, NP  ranitidine (ZANTAC) 150 MG capsule Take 1 capsule (150 mg total) by mouth 2 (two) times daily. 01/07/15  Yes Edward Saguier, PA-C  senna-docusate (SENOKOT-S) 8.6-50 MG  per tablet Take 1-2 tablets by mouth 2 (two) times daily as needed for mild constipation or moderate constipation. 10/21/13  Yes Debbrah Alar, NP  sertraline (ZOLOFT) 100 MG tablet Take 1 tablet (100 mg total) by mouth daily. 05/13/14  Yes Debbrah Alar, NP  torsemide (DEMADEX) 20 MG tablet Take 40-60 mg by mouth 2 (two) times daily. Takes 3 tablets in the morning and 2 tablets in the evening.   Yes Historical Provider, MD  zolpidem (AMBIEN) 5 MG tablet TAKE 1 AND 1/2 TABLET BY MOUTH EVERY DAY AT BEDTIME 12/08/14  Yes Debbrah Alar, NP   BP 100/82 mmHg  Pulse 100  Temp(Src) 99.5 F (37.5 C) (Oral)  Resp 18  Ht 6\' 4"  (1.93 m)  Wt 104.129 kg  BMI 27.95 kg/m2  SpO2 99% Physical Exam  Constitutional: He appears well-developed and well-nourished. No distress.  HENT:  Head: Normocephalic and atraumatic.  Eyes: Conjunctivae are normal. Pupils are equal, round, and reactive to light.  Neck: Normal range of motion. Neck supple.  Cardiovascular: Normal rate, regular rhythm and normal heart sounds.   Pulmonary/Chest: Breath sounds normal. No respiratory distress. He exhibits tenderness (Right chest around PICC line).  Minor increased work of breath, but speaks in sentences. Tenderness and some minimal redness surrounding his PICC line located in the upper right chest. No discharge noted. No other tenderness noted in the other chest wall areas.   Abdominal: Soft. Bowel sounds are normal.  Musculoskeletal: He exhibits no edema or tenderness.  Lymphadenopathy:    He has no cervical adenopathy.  Neurological: He is alert.  Skin: Skin is warm and dry. He is not diaphoretic.  Nursing note and vitals reviewed.   ED Course  Procedures (including critical care time) Labs Review Labs Reviewed  BASIC METABOLIC PANEL - Abnormal; Notable for the following:    Potassium 3.2 (*)    Creatinine, Ser 1.72 (*)    GFR calc non Af Amer 40 (*)    GFR calc Af Amer 46 (*)    All other components  within normal limits  CBC - Abnormal; Notable for the following:    RBC 4.07 (*)    Hemoglobin 11.8 (*)    HCT 36.5 (*)    All other components within normal limits  URINALYSIS, ROUTINE W REFLEX MICROSCOPIC (NOT AT [facility]) -  Abnormal; Notable for the following:    Protein, ur 30 (*)    All other components within normal limits  BRAIN NATRIURETIC PEPTIDE - Abnormal; Notable for the following:    B Natriuretic Peptide 700.0 (*)    All other components within normal limits  HEPATIC FUNCTION PANEL - Abnormal; Notable for the following:    Albumin 2.8 (*)    Total Bilirubin 1.4 (*)    All other components within normal limits  URINE MICROSCOPIC-ADD ON - Abnormal; Notable for the following:    Squamous Epithelial / LPF 0-5 (*)    Bacteria, UA RARE (*)    Casts HYALINE CASTS (*)    All other components within normal limits  I-STAT TROPOININ, ED - Abnormal; Notable for the following:    Troponin i, poc 0.11 (*)    All other components within normal limits  URINE CULTURE  CULTURE, BLOOD (ROUTINE X 2)  CULTURE, BLOOD (ROUTINE X 2)  LIPASE, BLOOD  DIFFERENTIAL  I-STAT CG4 LACTIC ACID, ED  I-STAT CG4 LACTIC ACID, ED    Imaging Review Dg Chest 2 View  01/06/2015  CLINICAL DATA:  Chest pain for 2 weeks. EXAM: CHEST  2 VIEW COMPARISON:  December 03, 2014. FINDINGS: Stable mild cardiomegaly is noted. Left-sided pacemaker is unchanged in position. Right internal jugular PICC line is also noted with distal tip in expected position of the SVC. No pneumothorax or pleural effusion is noted. No acute pulmonary disease is noted. Bony thorax is unremarkable. IMPRESSION: No active cardiopulmonary disease. Electronically Signed   By: Marijo Conception, M.D.   On: 01/25/2015 17:48   Nm Gastric Emptying  01/14/2015  CLINICAL DATA:  Early satiety associated with abdominal chest pain with nausea vomiting bloating and reflux. EXAM: NUCLEAR MEDICINE GASTRIC EMPTYING SCAN TECHNIQUE: After oral ingestion of  radiolabeled meal, sequential abdominal images were obtained for 4 hours. Percentage of activity emptying the stomach was calculated at 1 hour, 2 hour, 3 hour, and 4 hours. RADIOPHARMACEUTICALS:  2.0 MCi Tc-47m MDP labeled sulfur colloid orally in 2 egg whites with 1 piece of toast and 6 ounces of water. COMPARISON:  Abdominal ultrasound of January 08, 2015 FINDINGS: Expected location of the stomach in the left upper quadrant. Ingested meal empties the stomach gradually over the course of the study. Sixty-five percent emptied at 1 hr ( normal >= 10%) Seventy-seven percent emptied at 2 hr ( normal >= 40%) 95% emptied at 3 hr ( normal >= 70%) IMPRESSION: Normal. Gastric emptying study. Electronically Signed   By: David  Martinique M.D.   On: 01/14/2015 10:47   I have personally reviewed and evaluated these images and lab results as part of my medical decision-making.   EKG Interpretation   Date/Time:  Friday January 15 2015 15:58:01 EST Ventricular Rate:  101 PR Interval:    QRS Duration: 112 QT Interval:  310 QTC Calculation: 401 R Axis:   -62 Text Interpretation:  Atrial fibrillation with rapid ventricular response  with premature ventricular or aberrantly conducted complexes , new since  last tracing Left axis deviation Low voltage QRS Incomplete right bundle  branch block Septal infarct , age undetermined Abnormal ECG Confirmed by  KNAPP  MD-J, JON [id number]) on 01/05/2015 4:48:15 PM      MDM   Final diagnoses:  Chest pain, unspecified chest pain type  PICC (peripherally inserted central catheter) in place    [place] presents with chest pain and shortness of breath for the last 3 days.  Findings and plan of care discussed with Dorie Rank, MD.  This patient's evaluation will include both a search for possible infection as well as going down the route of a cardiac workup. Cardiac cath in 2008 showed no CAD. Pt does not appear to be in acute heart failure. Blood cultures were drawn  both peripherally as well as from the PICC line. Patient's troponin noted to be elevated above the level of previous results. Cardiology consult was placed. 5:53 PM Spoke with Dr. Gwenlyn Found, cardiologist on call, who states that the patient will need to be evaluated by cardiology while he is here. Requested that the patient be admitted via the hospitalist service and that cardiology would consult. No further instructions. Hospitalist consult placed. 7:25 PM Spoke with Dr. Harl Bowie who reiterated what Dr. Gwenlyn Found, that is that the patient should be admitted via the hospitalist service and that cardiology would be happy to consult. 8:10 PM Spoke with Dr. Olevia Bowens, who agreed to admit the patient to stepdown. No further instructions.  Filed Vitals:   01/26/2015 1621 01/11/2015 1645 02/01/2015 1900 01/18/2015 1930  BP:  119/96 100/63 100/82  Pulse:   105 100  Temp:      TempSrc:      Resp:  27 23 18   Height: 6\' 4"  (1.93 m)     Weight: 104.129 kg     SpO2:   96% 99%     Lorayne Bender, PA-C 01/29/2015 2014  Dorie Rank, MD 01/10/2015 2033

## 2015-01-15 NOTE — ED Notes (Signed)
Cardiologist berry at bedside

## 2015-01-16 ENCOUNTER — Inpatient Hospital Stay (HOSPITAL_COMMUNITY): Payer: Medicare Other

## 2015-01-16 DIAGNOSIS — R0602 Shortness of breath: Secondary | ICD-10-CM | POA: Insufficient documentation

## 2015-01-16 DIAGNOSIS — T827XXA Infection and inflammatory reaction due to other cardiac and vascular devices, implants and grafts, initial encounter: Secondary | ICD-10-CM

## 2015-01-16 DIAGNOSIS — Z95810 Presence of automatic (implantable) cardiac defibrillator: Secondary | ICD-10-CM

## 2015-01-16 DIAGNOSIS — R7881 Bacteremia: Secondary | ICD-10-CM | POA: Insufficient documentation

## 2015-01-16 DIAGNOSIS — I482 Chronic atrial fibrillation: Secondary | ICD-10-CM

## 2015-01-16 DIAGNOSIS — R072 Precordial pain: Secondary | ICD-10-CM

## 2015-01-16 DIAGNOSIS — I5022 Chronic systolic (congestive) heart failure: Secondary | ICD-10-CM

## 2015-01-16 DIAGNOSIS — I480 Paroxysmal atrial fibrillation: Secondary | ICD-10-CM

## 2015-01-16 DIAGNOSIS — I10 Essential (primary) hypertension: Secondary | ICD-10-CM

## 2015-01-16 LAB — BASIC METABOLIC PANEL
Anion gap: 7 (ref 5–15)
BUN: 16 mg/dL (ref 6–20)
CALCIUM: 8.7 mg/dL — AB (ref 8.9–10.3)
CO2: 27 mmol/L (ref 22–32)
CREATININE: 1.77 mg/dL — AB (ref 0.61–1.24)
Chloride: 104 mmol/L (ref 101–111)
GFR calc non Af Amer: 39 mL/min — ABNORMAL LOW (ref >60)
GFR, EST AFRICAN AMERICAN: 45 mL/min — AB (ref >60)
Glucose, Bld: 104 mg/dL — ABNORMAL HIGH (ref 65–99)
Potassium: 3.2 mmol/L — ABNORMAL LOW (ref 3.5–5.1)
SODIUM: 138 mmol/L (ref 135–145)

## 2015-01-16 LAB — HEPATIC FUNCTION PANEL
ALBUMIN: 2.3 g/dL — AB (ref 3.5–5.0)
ALT: 19 U/L (ref 17–63)
AST: 25 U/L (ref 15–41)
Alkaline Phosphatase: 52 U/L (ref 38–126)
Bilirubin, Direct: 0.4 mg/dL (ref 0.1–0.5)
Indirect Bilirubin: 1.1 mg/dL — ABNORMAL HIGH (ref 0.3–0.9)
TOTAL PROTEIN: 6.3 g/dL — AB (ref 6.5–8.1)
Total Bilirubin: 1.5 mg/dL — ABNORMAL HIGH (ref 0.3–1.2)

## 2015-01-16 LAB — CBC WITH DIFFERENTIAL/PLATELET
Basophils Absolute: 0 10*3/uL (ref 0.0–0.1)
Basophils Relative: 0 %
EOS ABS: 0.1 10*3/uL (ref 0.0–0.7)
Eosinophils Relative: 1 %
HCT: 30.5 % — ABNORMAL LOW (ref 39.0–52.0)
HEMOGLOBIN: 10 g/dL — AB (ref 13.0–17.0)
LYMPHS ABS: 1.6 10*3/uL (ref 0.7–4.0)
LYMPHS PCT: 22 %
MCH: 29.3 pg (ref 26.0–34.0)
MCHC: 32.8 g/dL (ref 30.0–36.0)
MCV: 89.4 fL (ref 78.0–100.0)
Monocytes Absolute: 0.6 10*3/uL (ref 0.1–1.0)
Monocytes Relative: 8 %
NEUTROS PCT: 69 %
Neutro Abs: 4.8 10*3/uL (ref 1.7–7.7)
Platelets: 232 10*3/uL (ref 150–400)
RBC: 3.41 MIL/uL — AB (ref 4.22–5.81)
RDW: 14.7 % (ref 11.5–15.5)
WBC: 7.1 10*3/uL (ref 4.0–10.5)

## 2015-01-16 LAB — TROPONIN I
TROPONIN I: 0.12 ng/mL — AB (ref <0.031)
TROPONIN I: 0.11 ng/mL — AB (ref <0.031)
Troponin I: 0.13 ng/mL — ABNORMAL HIGH (ref <0.031)

## 2015-01-16 LAB — MRSA PCR SCREENING: MRSA BY PCR: NEGATIVE

## 2015-01-16 LAB — MAGNESIUM: MAGNESIUM: 1.5 mg/dL — AB (ref 1.7–2.4)

## 2015-01-16 LAB — PHOSPHORUS: Phosphorus: 2.8 mg/dL (ref 2.5–4.6)

## 2015-01-16 MED ORDER — SODIUM CHLORIDE 0.9 % IJ SOLN: 10.0000 mL | INTRAMUSCULAR | Status: DC | PRN

## 2015-01-16 MED ORDER — SODIUM CHLORIDE 0.9 % IJ SOLN
10.0000 mL | INTRAMUSCULAR | Status: DC | PRN
  Administered 2015-01-16: 10 mL

## 2015-01-16 MED ORDER — SODIUM CHLORIDE 0.9 % IV SOLN: INTRAVENOUS | Status: DC | PRN

## 2015-01-16 MED ORDER — SODIUM CHLORIDE 0.9 % IJ SOLN
10.0000 mL | Freq: Two times a day (BID) | INTRAMUSCULAR | Status: DC
  Administered 2015-01-16: 10 mL

## 2015-01-16 MED ORDER — MAGNESIUM SULFATE 4 GM/100ML IV SOLN
4.0000 g | Freq: Once | INTRAVENOUS | Status: AC
  Administered 2015-01-16: 4 g via INTRAVENOUS
  Filled 2015-01-16: qty 100

## 2015-01-16 MED ORDER — OXYCODONE HCL 5 MG PO TABS
10.0000 mg | ORAL_TABLET | Freq: Once | ORAL | Status: AC
  Administered 2015-01-16: 10 mg via ORAL
  Filled 2015-01-16: qty 2

## 2015-01-16 MED ORDER — SODIUM CHLORIDE 0.9 % IJ SOLN
10.0000 mL | INTRAMUSCULAR | Status: DC | PRN
Start: 2015-01-16 — End: 2015-01-20
  Administered 2015-01-16: 10 mL via INTRAVENOUS
  Filled 2015-01-16: qty 10

## 2015-01-16 MED ORDER — FUROSEMIDE 10 MG/ML IJ SOLN
40.0000 mg | Freq: Once | INTRAMUSCULAR | Status: AC
  Administered 2015-01-16: 40 mg via INTRAVENOUS
  Filled 2015-01-16: qty 4

## 2015-01-16 NOTE — Progress Notes (Deleted)
Peripherally Inserted Central Catheter/Midline Placement  The IV Nurse has discussed with the patient and/or persons authorized to consent for the patient, the purpose of this procedure and the potential benefits and risks involved with this procedure.  The benefits include less needle sticks, lab draws from the catheter and patient may be discharged home with the catheter.  Risks include, but not limited to, infection, bleeding, blood clot (thrombus formation), and puncture of an artery; nerve damage and irregular heat beat.  Alternatives to this procedure were also discussed.  Consent signed by wife.    PICC/Midline Placement Documentation  PICC Double Lumen PICC Right Other (Comment) (Active)  Indication for Insertion or Continuance of Line Home intravenous therapies (PICC only);Prolonged intravenous therapies 01/16/2015  8:00 AM  Site Assessment Red;Tender;Clean;Dry;Intact;Painful 01/16/2015  8:00 AM  Lumen #1 Status Flushed 01/16/2015  8:00 AM  Lumen #2 Status Occluded 01/16/2015  8:00 AM  Dressing Type Occlusive 01/16/2015  8:00 AM  Dressing Status Clean;Dry;Intact 01/16/2015  8:00 AM  Line Care Cap(s) changed;Connections checked and tightened 01/16/2015  5:55 AM  Dressing Change Due 01/23/2015 01/16/2015  5:55 AM     PICC Double Lumen (Ped) 10/28/13 PICC Right 23 cm (Active)       Gordan Payment 01/16/2015, 12:00 PM

## 2015-01-16 NOTE — Procedures (Signed)
Successful removal of (R)IJ tunneled PICC line Originally placed [id number] No complications.  Ascencion Dike PA-C Interventional Radiology 01/16/2015 1:07 PM

## 2015-01-16 NOTE — Progress Notes (Signed)
[facility] PROGRESS NOTE  TAWON AURICH [id number] DOB: [birth year] DOA: 01/28/2015 PCP: Nance Pear., NP  Assessment/Plan: 1-Line sepsis and bacteremia: non-toxic on exam -will continue current antibiotics -PICC line removed -will follow culture data -needs TEE to r/o ICD vegetation -cardiology on board, will follow rec's  2-essential HTN: stable and well controlled -will continue current antihypertensive therapy  3-hypokalemia: -will replete potassium -will check Mg level  4-Atrial fibrillation (HCC) -CHA2DS2-VASc SCORE=6 -continue apixaban. -On amiodarone and carvedilol for rate control.  5-Chronic systolic CHF (congestive heart failure), NYHA class 3-4  Cardiomyopathy, dilated, nonischemic (HCC) -Continue milrinone infusion.  -no signs of fluid overload on exam -will follow heart failure team rec's -daily weight and strict I's and O's  6-Anemia: anemia of chronic disease -given hx of anticoagulation will monitor Hgb trend -will also check hemoccult    Code Status: Full Family Communication: wife at bedside  Disposition Plan: remains inpatient, home when medically stable. Follow blood cx's speciation, needs TEE and then new line to be able to be discharge on Milrinone therapy.   Consultants:  Cardiology service   Procedures:  See below for x-ray reports   PICC removed on 01/15/14  Per cardiology will need TEE  Antibiotics:  vacomycin and zosyn 1/13  HPI/Subjective: Afebrile, complaining of pain on right side where PICC line is. Also with some SOB. No nausea, no vomiting, no palpitations   Objective: Filed Vitals:   01/16/15 0226 01/16/15 0400  BP: 106/89 99/75  Pulse: 109 96  Temp:    Resp: 33 22    Intake/Output Summary (Last 24 hours) at 01/16/15 1339 Last data filed at 01/16/15 0909  Gross per 24 hour  Intake 850.23 ml  Output    500 ml  Net 350.23 ml   Filed Weights   01/13/2015 1621 01/31/2015 2144 01/16/15 0700   Weight: 104.129 kg (229 lb 9 oz) 103.193 kg (227 lb 8 oz) 102.876 kg (226 lb 12.8 oz)    Exam:   General:  Afebrile, feeling SOB and complaining of pain in his right side around PICC line insertion   Cardiovascular: S1 and S2, no rubs or gallops  Respiratory: CTA bilaterally, good air movement   Abdomen: soft, NT, ND, positive BS  Musculoskeletal: no edema or cyanosis  Skin: right side of chest with mild erythema around site of PICC insertion, no drainage; tender to palpation.  Data Reviewed: Basic Metabolic Panel:  Recent Labs Lab 01/30/2015 1622 01/16/15 0004 01/16/15 0600  NA 141  --  138  K 3.2*  --  3.2*  CL 103  --  104  CO2 25  --  27  GLUCOSE 93  --  104*  BUN 17  --  16  CREATININE 1.72*  --  1.77*  CALCIUM 9.7  --  8.7*  MG  --  1.5*  --   PHOS  --  2.8  --    Liver Function Tests:  Recent Labs Lab 01/31/2015 1916 01/16/15 0600  AST 31 25  ALT 22 19  ALKPHOS 65 52  BILITOT 1.4* 1.5*  PROT 7.2 6.3*  ALBUMIN 2.8* 2.3*    Recent Labs Lab 01/19/2015 1916  LIPASE 27   CBC:  Recent Labs Lab 01/13/15 0932 01/26/2015 1622 01/16/15 0600  WBC 9.0 6.2 7.1  NEUTROABS 6.5 3.8 4.8  HGB 11.7* 11.8* 10.0*  HCT 35.9* 36.5* 30.5*  MCV 88.6 89.7 89.4  PLT 298.0 276 232   Cardiac Enzymes:  Recent Labs Lab 01/16/15 0004 01/16/15  0600  TROPONINI 0.13* 0.12*   BNP (last 3 results)  Recent Labs  07/27/14 1928 09/11/14 1052 01/11/2015 1916  BNP 407.5* 262.5* 700.0*   CBG: No results for input(s): GLUCAP in the last 168 hours.  Recent Results (from the past 240 hour(s))  Culture, blood (Routine X 2) w Reflex to ID Panel     Status: None (Preliminary result)   Collection Time: 01/06/2015  6:08 PM  Result Value Ref Range Status   Specimen Description BLOOD PICC LINE RIGHT CHEST  Final   Special Requests BOTTLES DRAWN AEROBIC AND ANAEROBIC 5CC  Final   Culture  Setup Time   Final    GRAM VARIABLE ROD IN BOTH AEROBIC AND ANAEROBIC BOTTLES CRITICAL  RESULT CALLED TO, READ BACK BY AND VERIFIED WITH: D DUBALL AT [phone] [id number] M. KELLY    Culture TOO YOUNG TO READ  Final   Report Status PENDING  Incomplete  Urine culture     Status: None (Preliminary result)   Collection Time: 02/02/2015  6:12 PM  Result Value Ref Range Status   Specimen Description URINE, CLEAN CATCH  Final   Special Requests NONE  Final   Culture NO GROWTH < 24 HOURS  Final   Report Status PENDING  Incomplete  MRSA PCR Screening     Status: None   Collection Time: 01/23/2015 10:15 PM  Result Value Ref Range Status   MRSA by PCR NEGATIVE NEGATIVE Final    Comment:        The GeneXpert MRSA Assay (FDA approved for NASAL specimens only), is one component of a comprehensive MRSA colonization surveillance program. It is not intended to diagnose MRSA infection nor to guide or monitor treatment for MRSA infections.      Studies: Dg Chest 2 View  01/31/2015  CLINICAL DATA:  Chest pain for 2 weeks. EXAM: CHEST  2 VIEW COMPARISON:  December 03, 2014. FINDINGS: Stable mild cardiomegaly is noted. Left-sided pacemaker is unchanged in position. Right internal jugular PICC line is also noted with distal tip in expected position of the SVC. No pneumothorax or pleural effusion is noted. No acute pulmonary disease is noted. Bony thorax is unremarkable. IMPRESSION: No active cardiopulmonary disease. Electronically Signed   By: Marijo Conception, M.D.   On: 01/13/2015 17:48    Scheduled Meds: . allopurinol  100 mg Oral Daily  . amiodarone  200 mg Oral BID  . apixaban  5 mg Oral BID  . atorvastatin  80 mg Oral Daily  . carvedilol  6.25 mg Oral BID WC  . famotidine  20 mg Oral BID  . gabapentin  300 mg Oral QHS  . pantoprazole  40 mg Oral Daily  . piperacillin-tazobactam (ZOSYN)  IV  3.375 g Intravenous Q8H  . potassium chloride  40 mEq Oral BID  . sertraline  100 mg Oral Daily  . sodium chloride  10-40 mL Intracatheter Q12H  . spironolactone  25 mg Oral Daily  . torsemide  40 mg  Oral q1800  . torsemide  60 mg Oral Q24H  . vancomycin  1,250 mg Intravenous Q24H   Continuous Infusions: . milrinone 0.375 mcg/kg/min (01/16/15 1245)    Principal Problem:   Line sepsis (HCC) Active Problems:   Essential hypertension   Automatic implantable cardioverter-defibrillator in situ   Chronic systolic CHF (congestive heart failure), NYHA class 3-4   Cardiomyopathy, dilated, nonischemic (HCC)   Atrial fibrillation (HCC)   Abdominal pain, chronic, epigastric   Chest pain:  Right-sided around  PICC line   Elevated troponin    Time spent: [facility] Pager [phone]. If 7PM-7AM, please contact night-coverage at [email or web address], password [facility] 01/16/2015, 1:39 PM  LOS: 1 day

## 2015-01-16 NOTE — H&P (Addendum)
[facility] History and Physical  GARO [name] [id number] DOB: 1950/12/23 DOA: 01/11/2015  Referring physician: Lorayne Bender, PA-C PCP: Darrell Pear., NP   Chief Complaint: Chest pain  HPI: [name] is a 65 y.o. male with a past medical history of CHF,  dilated nonischemic cardiomyopathy, nonsustained ventricular tachycardia, status post AICD placement, atrial fibrillation, sarcoidosis,GERD, hyperlipidemia, gout, hypertension, stroke, neuropathy who comes to the emergency department due to having 3 day history of chest pain and dyspnea.  Per patient, his home health nurse came to his house today and was concerned about his chest pain and dyspnea. She also noticed that he had erythema, tenderness and edema around the PICC line area. Patient also states he has been having low-grade fevers and chills. He is states that his symptoms feel similar to when he had a previous line infection about 2 or 3 years ago.   When seen in the ED, the patient was in no acute distress. He stated that he felt better after analgesics were given.    Review of Systems:  Constitutional:  Positive for Fevers, chills, fatigue. No weight loss, night sweats,   HEENT:  No headaches, Difficulty swallowing,Tooth/dental problems,Sore throat,  No sneezing, itching, ear ache, nasal congestion, post nasal drip,  Cardio-vascular:   Positive chest pain,swelling in lower extremities,  No  Orthopnea, PND,anasarca, dizziness, palpitations  GI:  Positive chronic abdominal pain, nausea, No heartburn, indigestion,  vomiting, diarrhea, change in bowel habits, loss of appetite  Resp:  Positive dyspnea, occasionally productive cough. Denies wheezing or hemoptysis. Skin:  no rash or lesions.  GU:  no dysuria, change in color of urine, no urgency or frequency. No flank pain.  Musculoskeletal:  No joint pain or swelling. No decreased range of motion. No back pain.  Psych:  No change in mood or  affect. No depression or anxiety. No memory loss.   Past Medical History  Diagnosis Date  . CHF (congestive heart failure) ([place])   . Sarcoidosis ([place])   . Cardiomyopathy, dilated, nonischemic ([place])     non ischemic by cath  . Acute on chronic systolic heart failure ([place])   . Automatic implantable cardiac defibrillator in situ   . Atrial fibrillation ([place])   . NSVT (nonsustained ventricular tachycardia) ([place])   . GERD (gastroesophageal reflux disease)   . Hypercholesteremia   . Shortness of breath   . Pacemaker   . Anginal pain ([place])   . Gout   . Hypertension     dr [name]  . Coronary artery disease   . Elevated PSA 06/24/2013  . Myocardial infarction ([place])   . Headache   . Neuropathy ([place])   . Stroke ([place]) 08/2014  . Anxiety   . Chronic kidney disease (CKD), stage III (moderate)     colodonato  . On supplemental oxygen therapy     "2.5L; pretty much all the time; mostly when I'm mobile" (01/13/2015"   Past Surgical History  Procedure Laterality Date  . Pacemaker insertion  2009    with ICD  . Tee without cardioversion  01/17/2011    Procedure: TRANSESOPHAGEAL ECHOCARDIOGRAM (TEE);  Surgeon: Birdie Riddle, MD;  Location: [place];  Service: Cardiovascular;  Laterality: N/A;  . Cardioversion  01/17/2011    Procedure: CARDIOVERSION;  Surgeon: Birdie Riddle, MD;  Location: [place];  Service: Cardiovascular;  Laterality: N/A;  . Cardiac catheterization    . Insert / replace / remove pacemaker    . Anterior cervical decomp/discectomy fusion  08/21/2011    Procedure: ANTERIOR CERVICAL DECOMPRESSION/DISCECTOMY FUSION 2 LEVELS;  Surgeon: Marybelle Killings, MD;  Location: [place];  Service: Orthopedics;  Laterality: N/A;  C5-6, C6-7 Anterior Cervical Discectomy and Fusion, allograft, plate  . Tee without cardioversion N/A 02/16/2012    Procedure: TRANSESOPHAGEAL ECHOCARDIOGRAM (TEE);  Surgeon: Jolaine Artist, MD;  Location: [facility] ENDOSCOPY;  Service: Cardiovascular;  Laterality:  N/A;  original case scheduled under his dad (who is deceased), rescheduled under correct mrn/pt/dob. [place]/dl  . Tee without cardioversion N/A 03/22/2012    Procedure: TRANSESOPHAGEAL ECHOCARDIOGRAM (TEE);  Surgeon: Lelon Perla, MD;  Location: [place];  Service: Cardiovascular;  Laterality: N/A;  . Cardioversion N/A 03/22/2012    Procedure: CARDIOVERSION;  Surgeon: Lelon Perla, MD;  Location: [facility] ENDOSCOPY;  Service: Cardiovascular;  Laterality: N/A;  . Cardioversion N/A 04/26/2012    Procedure: CARDIOVERSION;  Surgeon: Jolaine Artist, MD;  Location: [facility] ENDOSCOPY;  Service: Cardiovascular;  Laterality: N/A;  . Central venous catheter tunneled insertion single lumen  09/16/2012    right IJ  . Cardioversion N/A 11/08/2012    Procedure: CARDIOVERSION;  Surgeon: Jolaine Artist, MD;  Location: [facility] ENDOSCOPY;  Service: Cardiovascular;  Laterality: N/A;  . Back surgery  1987, lower    Ruptured disk repair  . Peripherally inserted central catheter insertion  2013  . Right heart catheterization N/A 01/26/2012    Procedure: RIGHT HEART CATH;  Surgeon: Jolaine Artist, MD;  Location: [facility] CATH LAB;  Service: Cardiovascular;  Laterality: N/A;  . Right heart catheterization N/A 04/30/2013    Procedure: RIGHT HEART CATH;  Surgeon: Jolaine Artist, MD;  Location: [facility] CATH LAB;  Service: Cardiovascular;  Laterality: N/A;   Social History:  reports that he has never smoked. He has never used smokeless tobacco. He reports that he does not drink alcohol or use illicit drugs.  Allergies  Allergen Reactions  . Nitroglycerin Other (See Comments)    "feels like head is going to bust open"  . Colchicine Nausea Only    Family History  Problem Relation Age of Onset  . Heart disease    . Heart failure    . Stroke    . Anesthesia problems Neg Hx   . Hypotension Neg Hx   . Malignant hyperthermia Neg Hx   . Pseudochol deficiency Neg Hx   . Heart failure Sister   . Heart attack Sister   .  Hyperlipidemia Mother   . Colon cancer Father     dx in his early 66's    Prior to Admission medications   Medication Sig Start Date End Date Taking? Authorizing Provider  allopurinol (ZYLOPRIM) 100 MG tablet Take 1 tablet (100 mg total) by mouth daily. 02/05/12  Yes Wynetta Emery, PA-C  ALPRAZolam Duanne Moron) 0.5 MG tablet Take 1 tablet (0.5 mg total) by mouth 3 (three) times daily as needed for anxiety. 12/04/14  Yes Debbrah Alar, NP  amiodarone (PACERONE) 200 MG tablet Take 200 mg by mouth 2 (two) times daily.   Yes Historical Provider, MD  apixaban (ELIQUIS) 5 MG TABS tablet Take 1 tablet (5 mg total) by mouth 2 (two) times daily. 09/24/14  Yes Amy D Clegg, NP  atorvastatin (LIPITOR) 80 MG tablet Take 1 tablet (80 mg total) by mouth daily. 10/17/14  Yes Debbrah Alar, NP  carvedilol (COREG) 6.25 MG tablet TAKE 1 TABLET (6.25 MG TOTAL) BY MOUTH 2 (TWO) TIMES DAILY WITH A MEAL. 08/20/14  Yes Larey Dresser, MD  dexlansoprazole (  DEXILANT) 60 MG capsule Take 1 capsule (60 mg total) by mouth daily. 30 min prior to breakfast 12/24/14  Yes Lori P Hvozdovic, PA-C  diphenhydrAMINE (BENADRYL) 50 MG capsule Take 50 mg by mouth daily as needed for itching.   Yes Historical Provider, MD  eplerenone (INSPRA) 25 MG tablet Take 12.5 mg by mouth daily.  09/11/13  Yes Rande Brunt, NP  fluticasone (FLONASE) 50 MCG/ACT nasal spray PLACE 2 SPRAYS INTO BOTH NOSTRILS DAILY AS NEEDED FOR ALLERGIES. 01/06/15  Yes Debbrah Alar, NP  gabapentin (NEURONTIN) 300 MG capsule Take 300 mg by mouth at bedtime.   Yes Historical Provider, MD  metolazone (ZAROXOLYN) 2.5 MG tablet Take 1 tablet (2.5 mg total) by mouth daily as needed (for fluid retention). 12/14/14  Yes Debbrah Alar, NP  milrinone (PRIMACOR) 20 MG/100ML SOLN infusion Inject 0.375 mcg/kg/min into the vein continuous. 440mg  in 414ml infusion 2.4 ml per hour   Yes Historical Provider, MD  ondansetron (ZOFRAN) 4 MG tablet Take 1 tablet (4 mg total) by  mouth every 8 (eight) hours as needed for nausea or vomiting. 12/24/14  Yes Lori P Hvozdovic, PA-C  oxyCODONE-acetaminophen (PERCOCET) 10-325 MG tablet Take 1 tablet by mouth every 6 (six) hours as needed for pain.  12/07/14  Yes Historical Provider, MD  potassium chloride 20 MEQ TBCR Take 40 mEq by mouth 2 (two) times daily. Patient taking differently: Take 20 mEq by mouth 3 (three) times daily.  12/17/14  Yes Amy D Clegg, NP  ranitidine (ZANTAC) 150 MG capsule Take 1 capsule (150 mg total) by mouth 2 (two) times daily. 01/07/15  Yes Edward Saguier, PA-C  senna-docusate (SENOKOT-S) 8.6-50 MG per tablet Take 1-2 tablets by mouth 2 (two) times daily as needed for mild constipation or moderate constipation. 10/21/13  Yes Debbrah Alar, NP  sertraline (ZOLOFT) 100 MG tablet Take 1 tablet (100 mg total) by mouth daily. 05/13/14  Yes Debbrah Alar, NP  torsemide (DEMADEX) 20 MG tablet Take 40-60 mg by mouth 2 (two) times daily. Takes 3 tablets in the morning and 2 tablets in the evening.   Yes Historical Provider, MD  zolpidem (AMBIEN) 5 MG tablet TAKE 1 AND 1/2 TABLET BY MOUTH EVERY DAY AT BEDTIME 12/08/14  Yes Debbrah Alar, NP   Physical Exam: Filed Vitals:   01/25/2015 2045 01/04/2015 2117 01/18/2015 2119 01/25/2015 2144  BP: 95/66  110/78   Pulse: 107     Temp:  98.7 F (37.1 C)  99.2 F (37.3 C)  TempSrc:  Oral    Resp: 33     Height:    6\' 4"  (1.93 m)  Weight:    103.193 kg (227 lb 8 oz)  SpO2: 97%  98%     Wt Readings from Last 3 Encounters:  01/16/2015 103.193 kg (227 lb 8 oz)  01/14/15 106.232 kg (234 lb 3.2 oz)  01/13/15 107.593 kg (237 lb 3.2 oz)    General:  Appears calm and comfortable Eyes: PERRL, normal lids, irises & conjunctiva ENT: grossly normal hearing, lips & tongue Neck: no LAD, masses or thyromegaly Cardiovascular: Irregularly irregular, no m/r/g. Trace LE edema. Telemetry: Atrial fibrillation Respiratory: CTA bilaterally, no w/r/r. Normal respiratory  effort. Abdomen: soft, mild positive epigastric tenderness, no guarding no rebound tenderness Skin: Positive erythema, tenderness, edema and calor around PICC line insertion area. Musculoskeletal: Right upper chest area tenderness. Psychiatric: grossly normal mood and affect, speech fluent and appropriate Neurologic: grossly non-focal.          Labs  on Admission:  Basic Metabolic Panel:  Recent Labs Lab 01/05/2015 1622 01/16/15 0004  NA 141  --   K 3.2*  --   CL 103  --   CO2 25  --   GLUCOSE 93  --   BUN 17  --   CREATININE 1.72*  --   CALCIUM 9.7  --   MG  --  1.5*  PHOS  --  2.8   Liver Function Tests:  Recent Labs Lab 01/08/2015 1916  AST 31  ALT 22  ALKPHOS 65  BILITOT 1.4*  PROT 7.2  ALBUMIN 2.8*    Recent Labs Lab 01/27/2015 1916  LIPASE 27   CBC:  Recent Labs Lab 01/13/15 0932 01/12/2015 1622  WBC 9.0 6.2  NEUTROABS 6.5 3.8  HGB 11.7* 11.8*  HCT 35.9* 36.5*  MCV 88.6 89.7  PLT 298.0 276   Cardiac Enzymes:  Recent Labs Lab 01/16/15 0004  TROPONINI 0.13*    BNP (last 3 results)  Recent Labs  07/27/14 1928 09/11/14 1052 01/05/2015 1916  BNP 407.5* 262.5* 700.0*     Radiological Exams on Admission: Dg Chest 2 View  01/05/2015  CLINICAL DATA:  Chest pain for 2 weeks. EXAM: CHEST  2 VIEW COMPARISON:  December 03, 2014. FINDINGS: Stable mild cardiomegaly is noted. Left-sided pacemaker is unchanged in position. Right internal jugular PICC line is also noted with distal tip in expected position of the SVC. No pneumothorax or pleural effusion is noted. No acute pulmonary disease is noted. Bony thorax is unremarkable. IMPRESSION: No active cardiopulmonary disease. Electronically Signed   By: Marijo Conception, M.D.   On: 01/03/2015 17:48   Nm Gastric Emptying  01/14/2015  CLINICAL DATA:  Early satiety associated with abdominal chest pain with nausea vomiting bloating and reflux. EXAM: NUCLEAR MEDICINE GASTRIC EMPTYING SCAN TECHNIQUE: After oral  ingestion of radiolabeled meal, sequential abdominal images were obtained for 4 hours. Percentage of activity emptying the stomach was calculated at 1 hour, 2 hour, 3 hour, and 4 hours. RADIOPHARMACEUTICALS:  2.0 MCi Tc-57m MDP labeled sulfur colloid orally in 2 egg whites with 1 piece of toast and 6 ounces of water. COMPARISON:  Abdominal ultrasound of January 08, 2015 FINDINGS: Expected location of the stomach in the left upper quadrant. Ingested meal empties the stomach gradually over the course of the study. Sixty-five percent emptied at 1 hr ( normal >= 10%) Seventy-seven percent emptied at 2 hr ( normal >= 40%) 95% emptied at 3 hr ( normal >= 70%) IMPRESSION: Normal. Gastric emptying study. Electronically Signed   By: Shelvy Perazzo  [place] M.D.   On: 01/14/2015 10:47    EKG: Independently reviewed.  Vent. rate 101 BPM PR interval * ms QRS duration 112 ms QT/QTc 310/401 ms P-R-T axes * -62 88 Atrial fibrillation with rapid ventricular response with premature ventricular or aberrantly conducted complexes Left axis deviation Low voltage QRS Incomplete right bundle branch block Septal infarct , age undetermined  Assessment/Plan Principal Problem:   Line sepsis ([place]) Admit to a stepdown. Blood cultures 2. IV antibiotic coverage with Zosyn and vancomycin per pharmacy. Analgesics as needed.   Active Problems:   Essential hypertension Continue current antihypertensive therapy with carvedilol 6.25 mg by mouth twice a day. Monitor blood pressure periodically.    Automatic implantable cardioverter-defibrillator in situ No discharges per patient. Continue amiodarone and beta blocker.     Chronic systolic CHF (congestive heart failure), NYHA class 3-4   Cardiomyopathy, dilated, nonischemic (HCC) Continue milrinone infusion.  Continue  Inspra 12.5 mg by mouth daily or equivalent.      Atrial fibrillation (HCC)    CHA2DS2-VASc SCORE=6  continue anticoagulation therapy.  On amiodarone and  carvedilol for rate control.  Replace magnesium and potassium.    Chest pain:  Right-sided around PICC line   Elevated troponin Trend troponin levels. Cardiology  does not feel very strongly about further cardio workup at the moment, unless the patient develops symptoms.   Cardiology on the case.    Code Status: Full code. DVT Prophylaxis: on Eliquis Family Communication: Disposition Plan:  admitted for IV antibiotic therapy   Time spent: About 70 minutes were spent in the process of this admission.  Reubin Milan [facility] Pager [phone].

## 2015-01-16 NOTE — Progress Notes (Signed)
18 beats NSVT noted on telemetry in pt with ICD.  VSS.  Pt asymptomatic.  Magnesium and potassium repletion in process.  Will continue to monitor.  [name]

## 2015-01-16 NOTE — Progress Notes (Signed)
[facility]  Patient Status:   Active pt with [facility] prior to this readmission  [facility] is providing the following services: HHRN and [facility] team for home Milrinone. [facility] hospital team will follow [name] while an inpatient to support home cares services upon  DC.  If patient discharges after hours, please call [phone].   [name] 01/16/2015, 6:38 AM

## 2015-01-16 NOTE — Progress Notes (Addendum)
Advanced Heart Failure Rounding Note   Subjective:     Admitted for CP at PICC line site and possible PICC line infection. + Chills. No fever. Coughing.   BCx from [facility] line growing "gram variable rods".  Says site was red and sore but now looks better.    Objective:   Weight Range:  Vital Signs:   Temp:  [98.7 F (37.1 C)-99.5 F (37.5 C)] 99.2 F (37.3 C) (01/13 2144) Pulse Rate:  [65-113] 96 (01/14 0400) Resp:  [18-36] 22 (01/14 0400) BP: (94-119)/(63-96) 99/75 mmHg (01/14 0400) SpO2:  [93 %-100 %] 96 % (01/14 0400) Weight:  [102.876 kg (226 lb 12.8 oz)-104.129 kg (229 lb 9 oz)] 102.876 kg (226 lb 12.8 oz) (01/14 0700)    Weight change: Filed Weights   01/21/2015 1621 01/09/2015 2144 01/16/15 0700  Weight: 104.129 kg (229 lb 9 oz) 103.193 kg (227 lb 8 oz) 102.876 kg (226 lb 12.8 oz)    Intake/Output:   Intake/Output Summary (Last 24 hours) at 01/16/15 1104 Last data filed at 01/16/15 0909  Gross per 24 hour  Intake 850.23 ml  Output    500 ml  Net 350.23 ml     Physical Exam: General:  Sitting on side of bed coughing HEENT: normal Neck: supple. JVP 6 . Carotids 2+ bilat; no bruits. No lymphadenopathy or thryomegaly appreciated. Cor: PMI laterally displaced. Regular rate & rhythm. No rubs, gallops or murmurs. R chest wall PICC line site ok mildly tender to palpation. No erythema or discharge.  Lungs: clear Abdomen: soft, nontender, nondistended. No hepatosplenomegaly. No bruits or masses. Good bowel sounds. Extremities: no cyanosis, clubbing, rash, edema Neuro: alert & orientedx3, cranial nerves grossly intact. moves all 4 extremities w/o difficulty. Affect pleasant  Telemetry: SR  Labs: Basic Metabolic Panel:  Recent Labs Lab 01/28/2015 1622 01/16/15 0004 01/16/15 0600  NA 141  --  138  K 3.2*  --  3.2*  CL 103  --  104  CO2 25  --  27  GLUCOSE 93  --  104*  BUN 17  --  16  CREATININE 1.72*  --  1.77*  CALCIUM 9.7  --  8.7*  MG  --  1.5*  --     PHOS  --  2.8  --     Liver Function Tests:  Recent Labs Lab 01/21/2015 1916 01/16/15 0600  AST 31 25  ALT 22 19  ALKPHOS 65 52  BILITOT 1.4* 1.5*  PROT 7.2 6.3*  ALBUMIN 2.8* 2.3*    Recent Labs Lab 01/09/2015 1916  LIPASE 27   No results for input(s): AMMONIA in the last 168 hours.  CBC:  Recent Labs Lab 01/13/15 0932 01/25/2015 1622 01/16/15 0600  WBC 9.0 6.2 7.1  NEUTROABS 6.5 3.8 4.8  HGB 11.7* 11.8* 10.0*  HCT 35.9* 36.5* 30.5*  MCV 88.6 89.7 89.4  PLT 298.0 276 232    Cardiac Enzymes:  Recent Labs Lab 01/16/15 0004 01/16/15 0600  TROPONINI 0.13* 0.12*    BNP: BNP (last 3 results)  Recent Labs  07/27/14 1928 09/11/14 1052 01/30/2015 1916  BNP 407.5* 262.5* 700.0*    ProBNP (last 3 results) No results for input(s): PROBNP in the last 8760 hours.    Other results:  Imaging: Dg Chest 2 View  01/07/2015  CLINICAL DATA:  Chest pain for 2 weeks. EXAM: CHEST  2 VIEW COMPARISON:  December 03, 2014. FINDINGS: Stable mild cardiomegaly is noted. Left-sided pacemaker is unchanged in position. Right  internal jugular PICC line is also noted with distal tip in expected position of the SVC. No pneumothorax or pleural effusion is noted. No acute pulmonary disease is noted. Bony thorax is unremarkable. IMPRESSION: No active cardiopulmonary disease. Electronically Signed   By: Marijo Conception, M.D.   On: 01/21/2015 17:48      Medications:     Scheduled Medications: . allopurinol  100 mg Oral Daily  . amiodarone  200 mg Oral BID  . apixaban  5 mg Oral BID  . atorvastatin  80 mg Oral Daily  . carvedilol  6.25 mg Oral BID WC  . famotidine  20 mg Oral BID  . gabapentin  300 mg Oral QHS  . pantoprazole  40 mg Oral Daily  . piperacillin-tazobactam (ZOSYN)  IV  3.375 g Intravenous Q8H  . potassium chloride  40 mEq Oral BID  . sertraline  100 mg Oral Daily  . spironolactone  25 mg Oral Daily  . torsemide  40 mg Oral q1800  . torsemide  60 mg Oral Q24H  .  vancomycin  1,250 mg Intravenous Q24H     Infusions: . milrinone 0.375 mcg/kg/min (01/16/15 0806)     PRN Medications:  acetaminophen, ALPRAZolam, diphenhydrAMINE, fluticasone, metolazone, ondansetron (ZOFRAN) IV, ondansetron, oxyCODONE-acetaminophen **AND** oxyCODONE, senna-docusate, sodium chloride, zolpidem   Assessment:   1. PICC line infection 2. Chronic systolic HF on home milrinone. NICM EF 20-25%      -s/p [employer] Jude ICD 3. PAF in NSR on amio 4. CKD stage III. Stable 5. H/o VT/VF 6. CP - due to PICC line site infection 7. Hypokalemia 8. Anemia   Plan/Discussion:     HF is stable. Bcx from PICC growing gram variable rods. Continue vanc/zosyn. Await speciation. Will need PICC line out. Will also need TEE to look for vegetation on ICD. Continue amio and apixaban for PAF. Supp K+.  Place PIV to give milrinone peripherally for now.   Hgb down from previous. No overt bleeding. Will need to follow.    Length of Stay: 1  Amiyrah Lamere MD 01/16/2015, 11:04 AM  Advanced Heart Failure Team Pager [phone] (M-F; [place])  Please contact [facility] for night-coverage after hours (4p -7a ) and weekends on [email or web address]

## 2015-01-17 DIAGNOSIS — R112 Nausea with vomiting, unspecified: Secondary | ICD-10-CM

## 2015-01-17 DIAGNOSIS — T827XXD Infection and inflammatory reaction due to other cardiac and vascular devices, implants and grafts, subsequent encounter: Secondary | ICD-10-CM

## 2015-01-17 LAB — BASIC METABOLIC PANEL
Anion gap: 7 (ref 5–15)
BUN: 24 mg/dL — AB (ref 6–20)
CO2: 21 mmol/L — ABNORMAL LOW (ref 22–32)
CREATININE: 2.39 mg/dL — AB (ref 0.61–1.24)
Calcium: 8.9 mg/dL (ref 8.9–10.3)
Chloride: 104 mmol/L (ref 101–111)
GFR calc Af Amer: 31 mL/min — ABNORMAL LOW (ref >60)
GFR, EST NON AFRICAN AMERICAN: 27 mL/min — AB (ref >60)
Glucose, Bld: 130 mg/dL — ABNORMAL HIGH (ref 65–99)
POTASSIUM: 4.2 mmol/L (ref 3.5–5.1)
SODIUM: 132 mmol/L — AB (ref 135–145)

## 2015-01-17 LAB — CBC WITH DIFFERENTIAL/PLATELET
BASOS ABS: 0 10*3/uL (ref 0.0–0.1)
BASOS PCT: 0 %
Eosinophils Absolute: 0.1 10*3/uL (ref 0.0–0.7)
Eosinophils Relative: 3 %
HEMATOCRIT: 30.5 % — AB (ref 39.0–52.0)
HEMOGLOBIN: 9.9 g/dL — AB (ref 13.0–17.0)
Lymphocytes Relative: 27 %
Lymphs Abs: 1.4 10*3/uL (ref 0.7–4.0)
MCH: 28.8 pg (ref 26.0–34.0)
MCHC: 32.5 g/dL (ref 30.0–36.0)
MCV: 88.7 fL (ref 78.0–100.0)
MONOS PCT: 15 %
Monocytes Absolute: 0.8 10*3/uL (ref 0.1–1.0)
NEUTROS ABS: 2.8 10*3/uL (ref 1.7–7.7)
NEUTROS PCT: 55 %
Platelets: 221 10*3/uL (ref 150–400)
RBC: 3.44 MIL/uL — AB (ref 4.22–5.81)
RDW: 14.4 % (ref 11.5–15.5)
WBC: 5.1 10*3/uL (ref 4.0–10.5)

## 2015-01-17 LAB — MAGNESIUM: Magnesium: 2.1 mg/dL (ref 1.7–2.4)

## 2015-01-17 LAB — URINE CULTURE

## 2015-01-17 MED ORDER — METOCLOPRAMIDE HCL 5 MG/ML IJ SOLN: 5.0000 mg | Freq: Three times a day (TID) | INTRAMUSCULAR | Status: DC | PRN

## 2015-01-17 NOTE — Progress Notes (Signed)
[facility] PROGRESS NOTE  [name] [id number] DOB: [birth year] DOA: 01/19/2015 PCP: Nance Pear., NP  Assessment/Plan: 1-Line sepsis and bacteremia: non-toxic on exam -will continue current antibiotics -PICC line removed; patient remains afebrile -will follow culture data (gram positive cocci growing) -needs TEE to r/o ICD vegetation -cardiology on board, will follow rec's  2-essential HTN: stable and well controlled -will continue current antihypertensive therapy  3-hypokalemia: -will replete potassium -will check Mg level  4-Atrial fibrillation (HCC) -CHA2DS2-VASc SCORE=6 -continue apixaban. -On amiodarone and carvedilol for rate control.  5-Chronic systolic CHF (congestive heart failure), NYHA class 3-4  Cardiomyopathy, dilated, nonischemic (HCC) -Continue milrinone infusion.  -no signs of fluid overload on exam -will follow heart failure team rec's -daily weight and strict I's and O's  6-Anemia: anemia of chronic disease -given hx of anticoagulation will monitor Hgb trend -will also check hemoccult   7-nausea/vomiting and early satiety -recent abd [place] and gastric emptying study WNL -plan was for EGD on 1/19 -given hospitalization and acute issues GI consulted -will change diet to Dys 3 and use PRN antiemetics   Code Status: Full Family Communication: wife at bedside  Disposition Plan: remains inpatient, home when medically stable. Follow blood cx's speciation, needs TEE and then new line to be able to be discharge on Milrinone therapy.   Consultants:  Cardiology service   GI  Procedures:  See below for x-ray reports   PICC removed on 01/15/14  Per cardiology will need TEE  Antibiotics:  vacomycin and zosyn 1/13  HPI/Subjective: Afebrile, breathing better and denying CP. Complaining of nausea, vomiting and early satiety. PICC line    Objective: Filed Vitals:   01/16/15 2100 01/17/15 0500  BP: 101/65 108/67  Pulse:  83 103  Temp: 98 F (36.7 C) 98.2 F (36.8 C)  Resp: 17 20    Intake/Output Summary (Last 24 hours) at 01/17/15 1454 Last data filed at 01/17/15 1000  Gross per 24 hour  Intake 1378.4 ml  Output    625 ml  Net  753.4 ml   Filed Weights   01/31/2015 2144 01/16/15 0700 01/17/15 0500  Weight: 103.193 kg (227 lb 8 oz) 102.876 kg (226 lb 12.8 oz) 105.235 kg (232 lb)    Exam:   General:  Afebrile, no CP, breathing better. Patient complaining of nausea, vomiting and early satiety. PICC removed on 1/14  Cardiovascular: S1 and S2, no rubs or gallops  Respiratory: CTA bilaterally, good air movement   Abdomen: soft, NT, ND, positive BS  Musculoskeletal: no edema or cyanosis  Data Reviewed: Basic Metabolic Panel:  Recent Labs Lab 01/17/2015 1622 01/16/15 0004 01/16/15 0600 01/17/15 0441  NA 141  --  138 132*  K 3.2*  --  3.2* 4.2  CL 103  --  104 104  CO2 25  --  27 21*  GLUCOSE 93  --  104* 130*  BUN 17  --  16 24*  CREATININE 1.72*  --  1.77* 2.39*  CALCIUM 9.7  --  8.7* 8.9  MG  --  1.5*  --   --   PHOS  --  2.8  --   --    Liver Function Tests:  Recent Labs Lab 01/23/2015 1916 01/16/15 0600  AST 31 25  ALT 22 19  ALKPHOS 65 52  BILITOT 1.4* 1.5*  PROT 7.2 6.3*  ALBUMIN 2.8* 2.3*    Recent Labs Lab 01/26/2015 1916  LIPASE 27   CBC:  Recent Labs Lab 01/13/15 0932 01/13/2015 1622  01/16/15 0600 01/17/15 0441  WBC 9.0 6.2 7.1 5.1  NEUTROABS 6.5 3.8 4.8 2.8  HGB 11.7* 11.8* 10.0* 9.9*  HCT 35.9* 36.5* 30.5* 30.5*  MCV 88.6 89.7 89.4 88.7  PLT 298.0 276 232 221   Cardiac Enzymes:  Recent Labs Lab 01/16/15 0004 01/16/15 0600 01/16/15 1700  TROPONINI 0.13* 0.12* 0.11*   BNP (last 3 results)  Recent Labs  07/27/14 1928 09/11/14 1052 01/27/2015 1916  BNP 407.5* 262.5* 700.0*   CBG: No results for input(s): GLUCAP in the last 168 hours.  Recent Results (from the past 240 hour(s))  Culture, blood (Routine X 2) w Reflex to ID Panel     Status:  None (Preliminary result)   Collection Time: 01/11/2015  6:08 PM  Result Value Ref Range Status   Specimen Description BLOOD PICC LINE RIGHT CHEST  Final   Special Requests BOTTLES DRAWN AEROBIC AND ANAEROBIC 5CC  Final   Culture  Setup Time   Final    GRAM VARIABLE ROD IN BOTH AEROBIC AND ANAEROBIC BOTTLES CRITICAL RESULT CALLED TO, READ BACK BY AND VERIFIED WITH: D DUBALL AT [phone] [id number] M. KELLY    Culture TOO YOUNG TO READ  Final   Report Status PENDING  Incomplete  Urine culture     Status: None   Collection Time: 01/18/2015  6:12 PM  Result Value Ref Range Status   Specimen Description URINE, CLEAN CATCH  Final   Special Requests NONE  Final   Culture MULTIPLE SPECIES PRESENT, SUGGEST RECOLLECTION  Final   Report Status 01/17/2015 FINAL  Final  Blood culture (routine x 2)     Status: None (Preliminary result)   Collection Time: 01/21/2015  6:55 PM  Result Value Ref Range Status   Specimen Description BLOOD RIGHT ARM  Final   Special Requests   Final    BOTTLES DRAWN AEROBIC AND ANAEROBIC 10CC AER,5CC ANA   Culture NO GROWTH 2 DAYS  Final   Report Status PENDING  Incomplete  Blood culture (routine x 2)     Status: None   Collection Time: 01/11/2015  7:00 PM  Result Value Ref Range Status   Specimen Description BLOOD LEFT HAND  Final   Special Requests BOTTLES DRAWN AEROBIC ONLY 10CC  Final   Culture  Setup Time   Final    GRAM POSITIVE COCCI IN CLUSTERS AEROBIC BOTTLE ONLY CRITICAL RESULT CALLED TO, READ BACK BY AND VERIFIED WITH: Woodfin Ganja [id number] [place]    Culture TOO YOUNG TO READ  Final   Report Status 01/17/2015 FINAL  Final  MRSA PCR Screening     Status: None   Collection Time: 01/14/2015 10:15 PM  Result Value Ref Range Status   MRSA by PCR NEGATIVE NEGATIVE Final    Comment:        The GeneXpert MRSA Assay (FDA approved for NASAL specimens only), is one component of a comprehensive MRSA colonization surveillance program. It is not intended to diagnose  MRSA infection nor to guide or monitor treatment for MRSA infections.      Studies: Dg Chest 2 View  01/29/2015  CLINICAL DATA:  Chest pain for 2 weeks. EXAM: CHEST  2 VIEW COMPARISON:  December 03, 2014. FINDINGS: Stable mild cardiomegaly is noted. Left-sided pacemaker is unchanged in position. Right internal jugular PICC line is also noted with distal tip in expected position of the SVC. No pneumothorax or pleural effusion is noted. No acute pulmonary disease is noted. Bony thorax is unremarkable. IMPRESSION: No active  cardiopulmonary disease. Electronically Signed   By: Marijo Conception, M.D.   On: 01/14/2015 17:48   Dg Chest Port 1 View  01/16/2015  CLINICAL DATA:  Short of breath for 1 week. History of hypertension and CHF well of sarcoidosis. EXAM: PORTABLE CHEST 1 VIEW COMPARISON:  01/14/2015 FINDINGS: Stable enlargement of the cardiac silhouette. No mediastinal or hilar masses or convincing adenopathy. Stable left anterior chest wall AICD. Right central line has been removed since the prior study. There is thickening of the peribronchovascular interstitial markings most evident in the lower lobes. No focal consolidation is seen to suggest pneumonia. No pleural effusion or pneumothorax. IMPRESSION: 1. Findings similar to the most recent prior exam. There is cardiomegaly and mild interstitial thickening. This interstitial thickening has increased when compared to an older prior study from 12/03/2014. Mild congestive heart failure with interstitial edema is suspected. Electronically Signed   By: Lajean Manes M.D.   On: 01/16/2015 14:15    Scheduled Meds: . allopurinol  100 mg Oral Daily  . amiodarone  200 mg Oral BID  . apixaban  5 mg Oral BID  . atorvastatin  80 mg Oral Daily  . carvedilol  6.25 mg Oral BID WC  . famotidine  20 mg Oral BID  . gabapentin  300 mg Oral QHS  . pantoprazole  40 mg Oral Daily  . piperacillin-tazobactam (ZOSYN)  IV  3.375 g Intravenous Q8H  . potassium  chloride  40 mEq Oral BID  . sertraline  100 mg Oral Daily  . spironolactone  25 mg Oral Daily  . torsemide  40 mg Oral q1800  . torsemide  60 mg Oral Q24H  . vancomycin  1,250 mg Intravenous Q24H   Continuous Infusions: . sodium chloride    . sodium chloride    . milrinone 0.375 mcg/kg/min (01/17/15 1448)    Principal Problem:   Line sepsis ([place]) Active Problems:   Essential hypertension   Automatic implantable cardioverter-defibrillator in situ   Chronic systolic CHF (congestive heart failure), NYHA class 3-4   Cardiomyopathy, dilated, nonischemic (HCC)   Atrial fibrillation (HCC)   Abdominal pain, chronic, epigastric   Chest pain:  Right-sided around PICC line   Elevated troponin   SOB (shortness of breath)   Bacteremia    Time spent: [facility] Pager [phone]. If 7PM-7AM, please contact night-coverage at [email or web address], password [facility] 01/17/2015, 2:54 PM  LOS: 2 days

## 2015-01-17 NOTE — Progress Notes (Addendum)
Patient had 23 beat run of [place] at 1722.  Patient was initially disoriented but that resolved and he was oriented X4.  I notified Dr. Eula Fried with Cardiology he gave a verbal order for a Magnesium lab draw.  I also notified Dr. Dyann Kief.  Patient's visitor stated he was seeing things (ie trying to pick up a cat off the floor).  Dr. Dyann Kief notified of this as well.

## 2015-01-17 NOTE — Progress Notes (Signed)
Patient had episode of Nausea and Vomiting.  He stated that it happens when he eats.  I gave po zofran and notified Dr. Dyann Kief.  He stated he would consult GI

## 2015-01-18 ENCOUNTER — Inpatient Hospital Stay (HOSPITAL_COMMUNITY): Payer: Medicare Other

## 2015-01-18 ENCOUNTER — Encounter (HOSPITAL_COMMUNITY): Payer: Self-pay | Admitting: Physician Assistant

## 2015-01-18 DIAGNOSIS — R1013 Epigastric pain: Secondary | ICD-10-CM

## 2015-01-18 DIAGNOSIS — R1310 Dysphagia, unspecified: Secondary | ICD-10-CM

## 2015-01-18 DIAGNOSIS — G8929 Other chronic pain: Secondary | ICD-10-CM

## 2015-01-18 DIAGNOSIS — I5023 Acute on chronic systolic (congestive) heart failure: Secondary | ICD-10-CM

## 2015-01-18 LAB — CBC WITH DIFFERENTIAL/PLATELET
Basophils Absolute: 0 10*3/uL (ref 0.0–0.1)
Basophils Relative: 0 %
EOS ABS: 0.1 10*3/uL (ref 0.0–0.7)
EOS PCT: 2 %
HCT: 29.6 % — ABNORMAL LOW (ref 39.0–52.0)
HEMOGLOBIN: 9.8 g/dL — AB (ref 13.0–17.0)
LYMPHS ABS: 1.4 10*3/uL (ref 0.7–4.0)
Lymphocytes Relative: 27 %
MCH: 29.1 pg (ref 26.0–34.0)
MCHC: 33.1 g/dL (ref 30.0–36.0)
MCV: 87.8 fL (ref 78.0–100.0)
MONO ABS: 0.6 10*3/uL (ref 0.1–1.0)
MONOS PCT: 11 %
Neutro Abs: 3.3 10*3/uL (ref 1.7–7.7)
Neutrophils Relative %: 60 %
PLATELETS: 241 10*3/uL (ref 150–400)
RBC: 3.37 MIL/uL — ABNORMAL LOW (ref 4.22–5.81)
RDW: 14.3 % (ref 11.5–15.5)
WBC: 5.4 10*3/uL (ref 4.0–10.5)

## 2015-01-18 LAB — CARBOXYHEMOGLOBIN
CARBOXYHEMOGLOBIN: 1.3 % (ref 0.5–1.5)
Carboxyhemoglobin: 0.8 % (ref 0.5–1.5)
Carboxyhemoglobin: 1.2 % (ref 0.5–1.5)
METHEMOGLOBIN: 0.8 % (ref 0.0–1.5)
METHEMOGLOBIN: 1 % (ref 0.0–1.5)
Methemoglobin: 0.8 % (ref 0.0–1.5)
O2 SAT: 20.3 %
O2 SAT: 75.3 %
O2 Saturation: 45.5 %
TOTAL HEMOGLOBIN: 10 g/dL — AB (ref 13.5–18.0)
Total hemoglobin: 7.7 g/dL — ABNORMAL LOW (ref 13.5–18.0)
Total hemoglobin: 9.7 g/dL — ABNORMAL LOW (ref 13.5–18.0)

## 2015-01-18 LAB — BASIC METABOLIC PANEL
Anion gap: 7 (ref 5–15)
BUN: 30 mg/dL — AB (ref 6–20)
CALCIUM: 9 mg/dL (ref 8.9–10.3)
CHLORIDE: 105 mmol/L (ref 101–111)
CO2: 23 mmol/L (ref 22–32)
CREATININE: 2.93 mg/dL — AB (ref 0.61–1.24)
GFR calc Af Amer: 24 mL/min — ABNORMAL LOW (ref >60)
GFR calc non Af Amer: 21 mL/min — ABNORMAL LOW (ref >60)
GLUCOSE: 108 mg/dL — AB (ref 65–99)
Potassium: 4.9 mmol/L (ref 3.5–5.1)
Sodium: 135 mmol/L (ref 135–145)

## 2015-01-18 LAB — VANCOMYCIN, TROUGH: VANCOMYCIN TR: 33 ug/mL — AB (ref 10.0–20.0)

## 2015-01-18 LAB — AMMONIA: AMMONIA: 40 umol/L — AB (ref 9–35)

## 2015-01-18 LAB — CULTURE, BLOOD (ROUTINE X 2)

## 2015-01-18 IMAGING — CR DG CHEST 2V
2 series · 2 of 2 positions shown · non-contrast
Comparison: 06/09/2013.

CLINICAL DATA: Chest pain.  Shortness of breath.

EXAM:
CHEST  2 VIEW

[w chest pa]
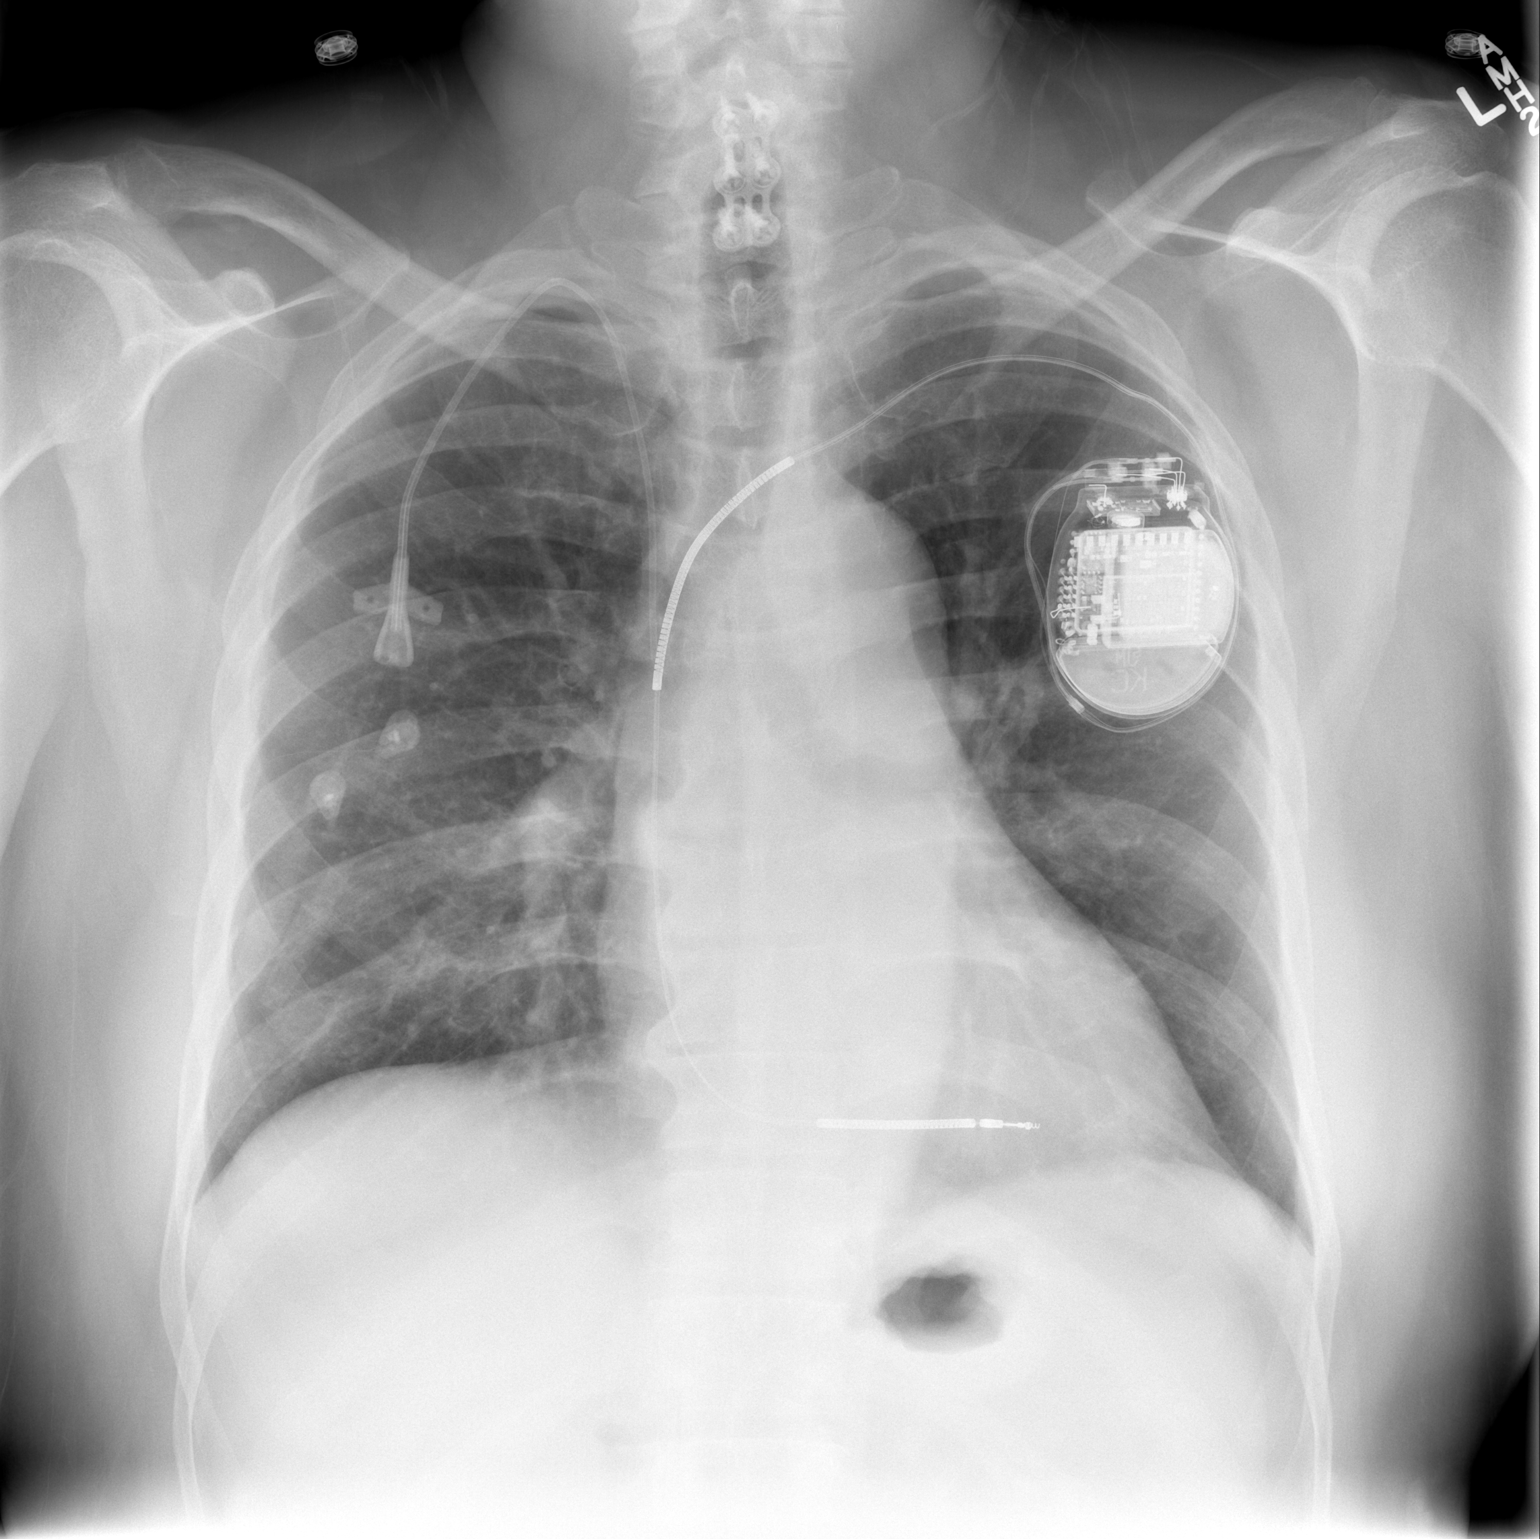

[w chest lat]
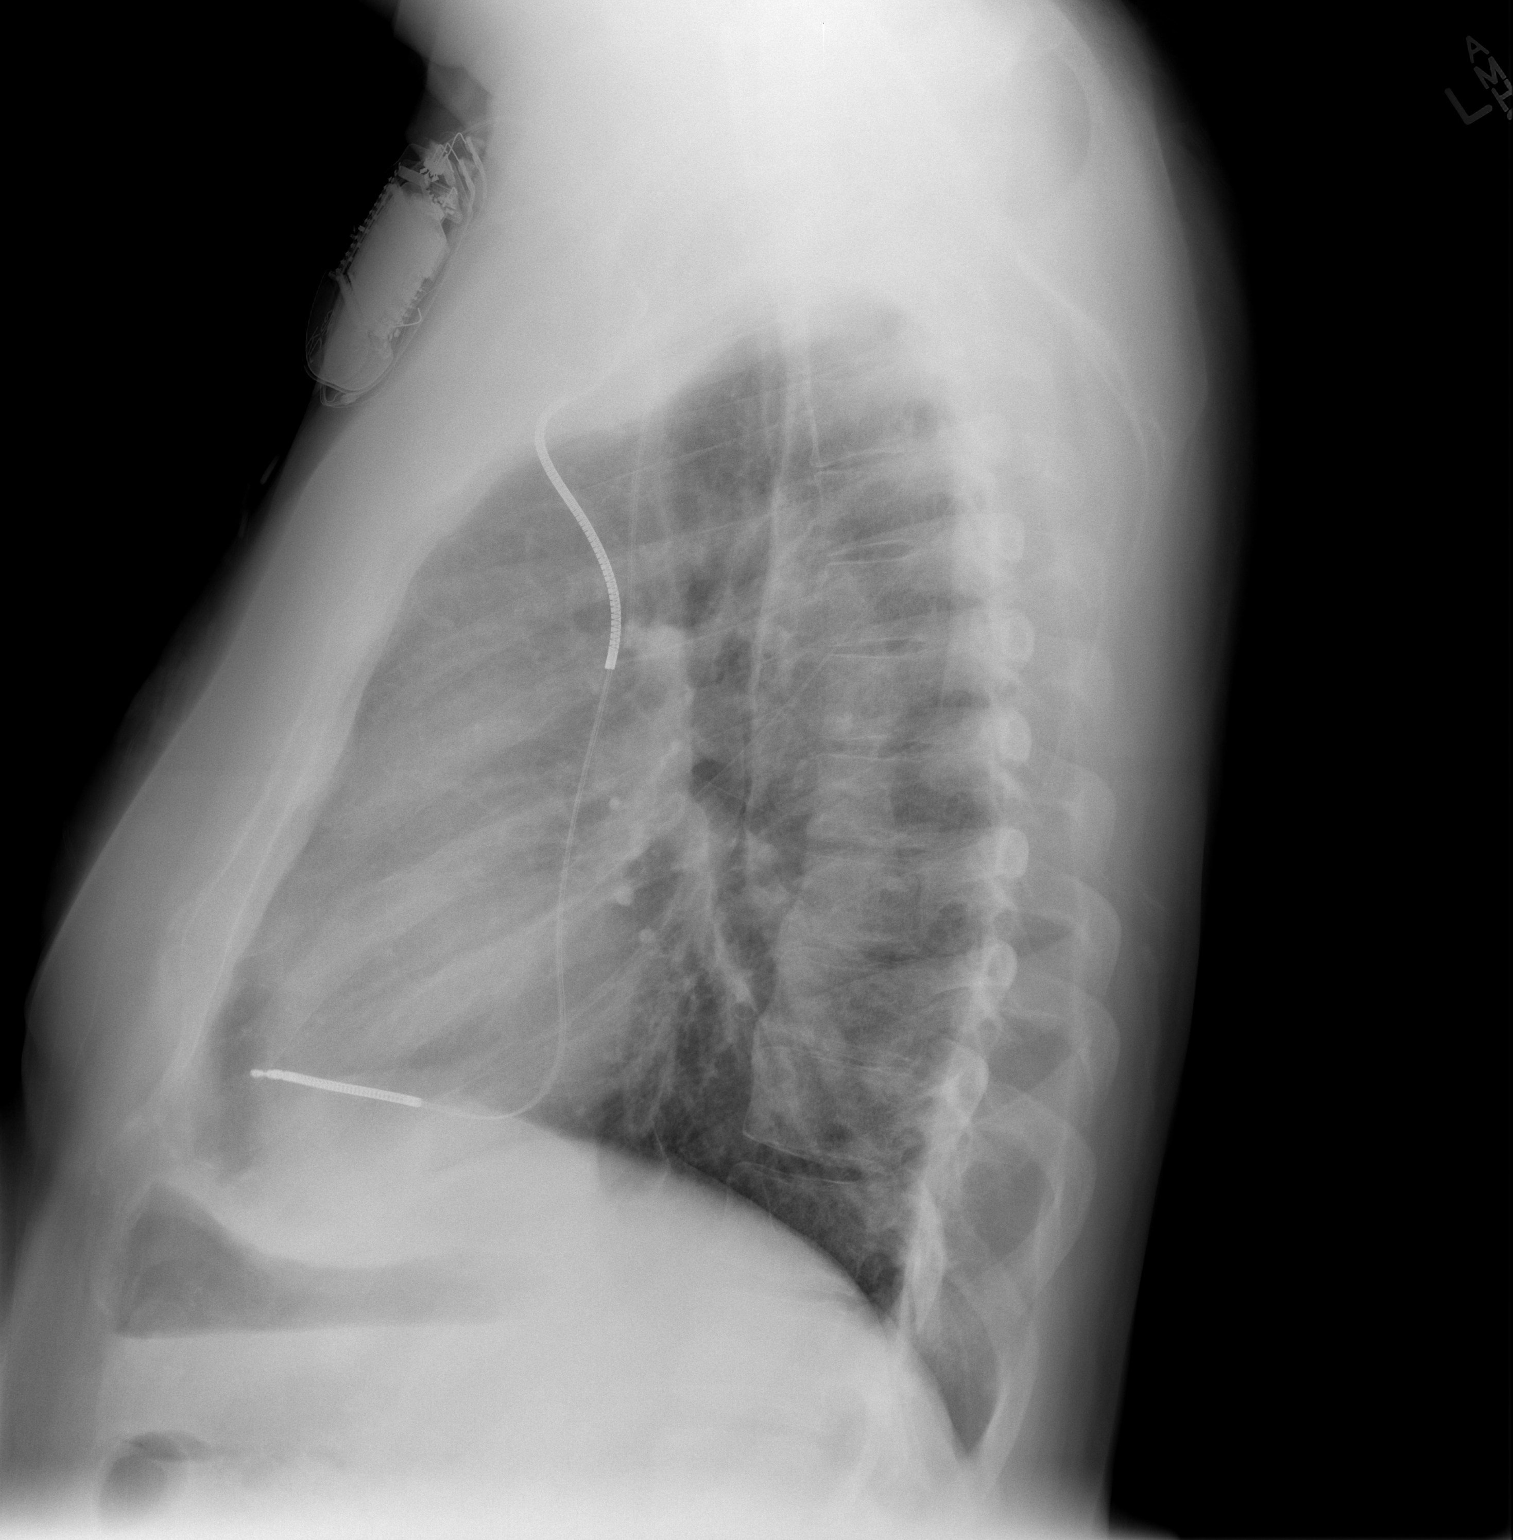

[2 of 2 positions shown; findings below may reference images not displayed]

FINDINGS: Mediastinum hilar structures normal. Cardiomegaly. Cardiac pacer
with lead tip in the right ventricle. Central line in stable
position. Lungs are clear. No pleural effusion or pneumothorax.
Cervical spine fusion.
IMPRESSION: 1. Central in stable position.  Cardiac pacer stent position.
2. Stable cardiomegaly, no CHF.
3. No acute pulmonary disease.  Chest is stable from 06/09/2013.

## 2015-01-18 MED ORDER — FUROSEMIDE 10 MG/ML IJ SOLN
120.0000 mg | Freq: Two times a day (BID) | INTRAMUSCULAR | Status: DC
  Administered 2015-01-18 – 2015-01-19 (×3): 120 mg via INTRAVENOUS
  Filled 2015-01-18 (×5): qty 12

## 2015-01-18 MED ORDER — FUROSEMIDE 10 MG/ML IJ SOLN
INTRAMUSCULAR | Status: AC
  Filled 2015-01-18: qty 8

## 2015-01-18 MED ORDER — VANCOMYCIN HCL IN DEXTROSE 1-5 GM/200ML-% IV SOLN
1000.0000 mg | INTRAVENOUS | Status: DC
  Administered 2015-01-19 – 2015-01-20 (×2): 1000 mg via INTRAVENOUS
  Filled 2015-01-18 (×4): qty 200

## 2015-01-18 MED ORDER — FUROSEMIDE 10 MG/ML IJ SOLN
INTRAMUSCULAR | Status: AC
  Filled 2015-01-18: qty 4

## 2015-01-18 MED ORDER — FUROSEMIDE 10 MG/ML IJ SOLN
80.0000 mg | Freq: Once | INTRAMUSCULAR | Status: AC
  Administered 2015-01-18: 80 mg via INTRAVENOUS

## 2015-01-18 MED ORDER — FUROSEMIDE 10 MG/ML IJ SOLN
40.0000 mg | Freq: Once | INTRAMUSCULAR | Status: AC
  Administered 2015-01-18: 40 mg via INTRAVENOUS

## 2015-01-18 MED ORDER — NOREPINEPHRINE BITARTRATE 1 MG/ML IV SOLN
2.0000 ug/min | INTRAVENOUS | Status: DC
  Administered 2015-01-18: 2 ug/min via INTRAVENOUS
  Filled 2015-01-18: qty 4

## 2015-01-18 NOTE — Procedures (Signed)
Central Venous Catheter Insertion Procedure Note [name] [id number] 12-13-1950  Procedure: Insertion of Central Venous Catheter Indications: Drug and/or fluid administration  Procedure Details Consent: Risks of procedure as well as the alternatives and risks of each were explained to the (patient/caregiver).  Consent for procedure obtained. Time Out: Verified patient identification, verified procedure, site/side was marked, verified correct patient position, special equipment/implants available, medications/allergies/relevent history reviewed, required imaging and test results available.  Performed  Maximum sterile technique was used including antiseptics, cap, gloves, gown, hand hygiene, mask and sheet. Skin prep: Chlorhexidine; local anesthetic administered A antimicrobial bonded/coated triple lumen catheter was placed in the left internal jugular vein using the Seldinger technique.  Evaluation Blood flow good Complications: No apparent complications Patient did tolerate procedure well. Chest X-ray ordered to verify placement.  CXR: normal. Procedure performed under direct supervision of Dr.Mannam. Ultrasound utilized for realtime vessel cannulation  [name] S. [facility] ANP-BC Pulmonary and [place] Pager: [phone] 01/18/2015, 1:38 PM

## 2015-01-18 NOTE — Progress Notes (Signed)
  Initial coox 20. Stat repeat sent.  PT has increased WOB and class IV symptoms. Tripod and tachypneic on my arrival.  Orders for Bipap placed.     Once CVP placed level 21-22 on my check.  Will give 80 mg IV lasix NOW and transfer to [facility] ICU bed when available.   Legrand Como [address] [place], PA-C 01/18/2015 2:53 PM

## 2015-01-18 NOTE — Progress Notes (Signed)
Patient transported, on Bipap, from 3W28 to room 2H13 without any complications.  Will continue to monitor.

## 2015-01-18 NOTE — Care Management Important Message (Signed)
Important Message  Patient Details  Name: [name] MRN: [id number] Date of Birth: 1950/11/18   Medicare Important Message Given:  Yes    Loann Quill 01/18/2015, 12:45 PM

## 2015-01-18 NOTE — Progress Notes (Signed)
UR Completed Vernica Wachtel Graves-Bigelow, RN,BSN [phone]  

## 2015-01-18 NOTE — Progress Notes (Signed)
Pharmacy Antibiotic Follow-up Note  [name] is a 65 y.o. year-old male admitted on 01/18/2015.    Patient currently on day # 3 Vanc and Zosyn for infected PICC line (started late 1/13 pm)  PICC line removed 1/14. Needs TEE to r/o vegetation on ICD when stable.  Afeb, WBC 5.4  Assessment/Plan Random vancomycin level drawn early by about 7 hours, would expect trough to still result high in 20s based on this level. Will reduce dose to 1g q24 hours and recheck at css later this week as needed. Continue to follow cx results and make adjustments as needed.  Temp (24hrs), Avg:97.7 F (36.5 C), Min:97.5 F (36.4 C), Max:98.2 F (36.8 C)   Recent Labs Lab 01/13/15 0932 01/07/2015 1622 01/16/15 0600 01/17/15 0441 01/18/15 0415  WBC 9.0 6.2 7.1 5.1 5.4    Recent Labs Lab 01/11/2015 1622 01/16/15 0600 01/17/15 0441 01/18/15 0415  CREATININE 1.72* 1.77* 2.39* 2.93*   Estimated Creatinine Clearance: 33.6 mL/min (by C-G formula based on Cr of 2.93).    Allergies  Allergen Reactions  . Nitroglycerin Other (See Comments)    "feels like head is going to bust open"  . Colchicine Nausea Only    Antimicrobials this admission: vanc 1/14 >> zosyn 1/13 >>  Levels/dose changes this admission: VR 33 - drawn ~7h prior to next dose>>new calculated trough around 22 on this dose   Microbiology results: 1/13 blood x 2 - 1/2 coag neg staph 1/13 blood - picc - gram variable rod (diphtheroids?) 1/13 urine - multiple species 1/13 MRSA PCR neg   Thank you for allowing pharmacy to be a part of this patient's care.  Erin Hearing PharmD., BCPS Clinical Pharmacist Pager [phone] 01/18/2015 3:37 PM

## 2015-01-18 NOTE — Progress Notes (Signed)
[facility] PROGRESS NOTE  [name] [id number] DOB: 11-13-1950 DOA: 01/21/2015 PCP: Nance Pear., NP  Assessment/Plan: 1-Line sepsis and bacteremia: non-toxic on exam -will continue current antibiotics -PICC line removed; patient remains afebrile -will follow culture data (gram positive cocci growing) -needs TEE to r/o ICD vegetation -cardiology on board, will follow rec's  2-essential HTN: stable and well controlled -will continue current antihypertensive therapy  3-hypokalemia: -will follow electrolytes and replete as needed   4-Atrial fibrillation (HCC) -CHA2DS2-VASc SCORE=6 -continue apixaban. -On amiodarone and carvedilol for rate control.  5-Chronic systolic CHF (congestive heart failure), NYHA class 3-4  Cardiomyopathy, dilated, nonischemic (HCC) -Continue milrinone infusion.  -positive fluid overload on exam, elevated BNP and also vascular congestion on CXR -will transfer to Heart failure team service -Cr also trending up  6-Anemia: anemia of chronic disease -given hx of anticoagulation will monitor Hgb trend  7-nausea/vomiting and early satiety -recent abd [place] and gastric emptying study WNL -plan was for EGD on 1/19 -given hospitalization and acute issues GI consulted while inpatient; doubt any procedure will be done until he more stable.  8-acute on chronic renal failure: most likely due poor perfusion with worsening heart failure -close follow up of patient Cr trend -further treatment for HF and diuretics to be manage by cardiologist  Code Status: Full Family Communication: wife at bedside  Disposition Plan: remains inpatient, will transfer to CHF team and move to [facility]   Consultants:  Cardiology service   GI  Procedures:  See below for x-ray reports   PICC removed on 01/15/14  Per cardiology will need TEE  Antibiotics:  vacomycin and zosyn 1/13  HPI/Subjective: Afebrile, denies CP. Overall feeling worse and with  increase work of breathing. BNP is elevated and patient's CXR with vascular congestion.   Objective: Filed Vitals:   01/18/15 1200 01/18/15 1503  BP: 106/80   Pulse: 98 90  Temp: 98.2 F (36.8 C)   Resp: 18 21    Intake/Output Summary (Last 24 hours) at 01/18/15 1511 Last data filed at 01/18/15 0830  Gross per 24 hour  Intake   1020 ml  Output    500 ml  Net    520 ml   Filed Weights   01/16/15 0700 01/17/15 0500 01/18/15 0500  Weight: 102.876 kg (226 lb 12.8 oz) 105.235 kg (232 lb) 106.051 kg (233 lb 12.8 oz)    Exam:   General:  Afebrile, complaining of SOB and using accessory muscles; not feeling well.  Cardiovascular: S1 and S2, no rubs or gallops, positive JVD  Respiratory: decrease BS at bases, positive tachypnea, no wheezing   Abdomen: soft, NT, ND, positive BS  Musculoskeletal: trace edema bilaterally, no cyanosis  Data Reviewed: Basic Metabolic Panel:  Recent Labs Lab 01/19/2015 1622 01/16/15 0004 01/16/15 0600 01/17/15 0441 01/17/15 1805 01/18/15 0415  NA 141  --  138 132*  --  135  K 3.2*  --  3.2* 4.2  --  4.9  CL 103  --  104 104  --  105  CO2 25  --  27 21*  --  23  GLUCOSE 93  --  104* 130*  --  108*  BUN 17  --  16 24*  --  30*  CREATININE 1.72*  --  1.77* 2.39*  --  2.93*  CALCIUM 9.7  --  8.7* 8.9  --  9.0  MG  --  1.5*  --   --  2.1  --   PHOS  --  2.8  --   --   --   --    Liver Function Tests:  Recent Labs Lab 01/18/2015 1916 01/16/15 0600  AST 31 25  ALT 22 19  ALKPHOS 65 52  BILITOT 1.4* 1.5*  PROT 7.2 6.3*  ALBUMIN 2.8* 2.3*    Recent Labs Lab 01/13/2015 1916  LIPASE 27   CBC:  Recent Labs Lab 01/13/15 0932 01/21/2015 1622 01/16/15 0600 01/17/15 0441 01/18/15 0415  WBC 9.0 6.2 7.1 5.1 5.4  NEUTROABS 6.5 3.8 4.8 2.8 3.3  HGB 11.7* 11.8* 10.0* 9.9* 9.8*  HCT 35.9* 36.5* 30.5* 30.5* 29.6*  MCV 88.6 89.7 89.4 88.7 87.8  PLT 298.0 276 232 221 241   Cardiac Enzymes:  Recent Labs Lab 01/16/15 0004  01/16/15 0600 01/16/15 1700  TROPONINI 0.13* 0.12* 0.11*   BNP (last 3 results)  Recent Labs  07/27/14 1928 09/11/14 1052 01/07/2015 1916  BNP 407.5* 262.5* 700.0*   CBG: No results for input(s): GLUCAP in the last 168 hours.  Recent Results (from the past 240 hour(s))  Culture, blood (Routine X 2) w Reflex to ID Panel     Status: None   Collection Time: 01/12/2015  6:08 PM  Result Value Ref Range Status   Specimen Description BLOOD PICC LINE RIGHT CHEST  Final   Special Requests BOTTLES DRAWN AEROBIC AND ANAEROBIC 5CC  Final   Culture  Setup Time   Final    GRAM VARIABLE ROD IN BOTH AEROBIC AND ANAEROBIC BOTTLES CRITICAL RESULT CALLED TO, READ BACK BY AND VERIFIED WITH: D DUBALL AT [phone] [id number] M. KELLY    Culture   Final    BACILLUS SPECIES Standardized susceptibility testing for this organism is not available.    Report Status 01/18/2015 FINAL  Final  Urine culture     Status: None   Collection Time: 01/30/2015  6:12 PM  Result Value Ref Range Status   Specimen Description URINE, CLEAN CATCH  Final   Special Requests NONE  Final   Culture MULTIPLE SPECIES PRESENT, SUGGEST RECOLLECTION  Final   Report Status 01/17/2015 FINAL  Final  Blood culture (routine x 2)     Status: None (Preliminary result)   Collection Time: 01/30/2015  6:55 PM  Result Value Ref Range Status   Specimen Description BLOOD RIGHT ARM  Final   Special Requests   Final    BOTTLES DRAWN AEROBIC AND ANAEROBIC 10CC AER,5CC ANA   Culture NO GROWTH 3 DAYS  Final   Report Status PENDING  Incomplete  Blood culture (routine x 2)     Status: None (Preliminary result)   Collection Time: 01/09/2015  7:00 PM  Result Value Ref Range Status   Specimen Description BLOOD LEFT HAND  Final   Special Requests BOTTLES DRAWN AEROBIC ONLY 10CC  Final   Culture  Setup Time   Final    GRAM POSITIVE COCCI IN CLUSTERS AEROBIC BOTTLE ONLY CRITICAL RESULT CALLED TO, READ BACK BY AND VERIFIED WITH: J TSOUTIS,RN [id number]  [place]    Culture   Final    STAPHYLOCOCCUS SPECIES (COAGULASE NEGATIVE) THE SIGNIFICANCE OF ISOLATING THIS ORGANISM FROM A SINGLE SET OF BLOOD CULTURES WHEN MULTIPLE SETS ARE DRAWN IS UNCERTAIN. PLEASE NOTIFY THE MICROBIOLOGY DEPARTMENT WITHIN ONE WEEK IF SPECIATION AND SENSITIVITIES ARE REQUIRED.    Report Status PENDING  Incomplete  MRSA PCR Screening     Status: None   Collection Time: 01/08/2015 10:15 PM  Result Value Ref Range Status   MRSA by PCR  NEGATIVE NEGATIVE Final    Comment:        The GeneXpert MRSA Assay (FDA approved for NASAL specimens only), is one component of a comprehensive MRSA colonization surveillance program. It is not intended to diagnose MRSA infection nor to guide or monitor treatment for MRSA infections.      Studies: Dg Chest Port 1 View  01/18/2015  CLINICAL DATA:  Central line placement. EXAM: PORTABLE CHEST 1 VIEW COMPARISON:  Single view of the chest 01/16/2015. FINDINGS: The patient has a new left IJ catheter. Tip of the catheter is just within the superior vena cava. No pneumothorax. There is cardiomegaly without edema. The lungs appear clear. AICD is noted. IMPRESSION: Left IJ catheter tip is just within the superior vena cava. Negative for pneumothorax. Cardiomegaly barium Electronically Signed   By: Inge Rise M.D.   On: 01/18/2015 13:32    Scheduled Meds: . allopurinol  100 mg Oral Daily  . amiodarone  200 mg Oral BID  . apixaban  5 mg Oral BID  . atorvastatin  80 mg Oral Daily  . carvedilol  6.25 mg Oral BID WC  . famotidine  20 mg Oral BID  . furosemide      . gabapentin  300 mg Oral QHS  . pantoprazole  40 mg Oral Daily  . piperacillin-tazobactam (ZOSYN)  IV  3.375 g Intravenous Q8H  . sertraline  100 mg Oral Daily   Continuous Infusions: . sodium chloride    . sodium chloride    . milrinone 0.375 mcg/kg/min (01/18/15 0741)  . norepinephrine (LEVOPHED) Adult infusion      Principal Problem:   Line sepsis ([place]) Active  Problems:   Essential hypertension   Automatic implantable cardioverter-defibrillator in situ   Chronic systolic CHF (congestive heart failure), NYHA class 3-4   Cardiomyopathy, dilated, nonischemic (HCC)   Atrial fibrillation (HCC)   Abdominal pain, chronic, epigastric   Chest pain:  Right-sided around PICC line   Elevated troponin   SOB (shortness of breath)   Bacteremia    Time spent: 25 minutes    Barton Dubois MD  [facility] Pager [phone]. If 7PM-7AM, please contact night-coverage at [email or web address], password [facility] 01/18/2015, 3:11 PM  LOS: 3 days

## 2015-01-18 NOTE — Care Management Note (Addendum)
Case Management Note  Patient Details  Name: [name] MRN: [id number] Date of Birth: Aug 06, 1950  Subjective/Objective: : Pt is a 65 y.o. male with a PMHx of CHF, dilated nonischemic cardiomyopathy, nonsustained ventricular tachycardia, status post AICD placement, atrial fibrillation, sarcoidosis,GERD, hyperlipidemia, gout, hypertension, stroke, neuropathy who comes to the emergency department due to having 3 day history of chest pain and dyspnea. Pt was having erythema, tenderness and edema around the PICC line area. Patient also stated he had been having low-grade fevers and chills. Pt is active with [facility] for [facility] for IV infusion Milrinone.   Action/Plan: If the plan continues to be home with [place] pt will need resumption orders for [facility] and F2F. CM will continue to monitor.   Expected Discharge Date:                  Expected Discharge Plan:  [place]  In-House Referral:  NA  Discharge planning Services  CM Consult  Post Acute Care Choice:  Home Health, Resumption of Svcs/PTA Provider Choice offered to:  Patient  DME Arranged:    DME Agency:     HH Arranged:  RN [place] Agency:  [place]  Status of Service:  In process, will continue to follow  Medicare Important Message Given:    Date Medicare IM Given:    Medicare IM give by:    Date Additional Medicare IM Given:    Additional Medicare Important Message give by:     If discussed at [place] of Stay Meetings, dates discussed:    Additional Comments:  Bethena Roys, RN 01/18/2015, 10:16 AM

## 2015-01-18 NOTE — Progress Notes (Signed)
RN called RT to patient room due to MD ordering Bipap on patient.  Upon arrival patient was noted to have increased respiratory rate, use of accessory muscles and diminished breath sounds.  Patient was placed on Bipap without complications and is currently tolerating well.  Will continue to monitor.

## 2015-01-18 NOTE — Consult Note (Signed)
[name]: 10:26 AM 01/18/2015  LOS: 3 days    Referring Provider: Dyann Kief  Primary Care Physician:  Nance Pear., NP Primary Gastroenterologist:  Dr. Ardis Hughs.      Reason for Consultation:  Drop in Hgb.    HPI: [name] is a 65 y.o. male.  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 Longstanding home milrinone.  CAD/MI.  Marland Kitchen A fib/flutter and hx V tach. S/p Pacemaker/ICD. S/p TEE/cardioversions.  Stage 4 CKD. Sarcoidosis.  Anxiety/depression.  Chronic pain ( on bid oxycodone).  S/p cervical spine surgery. Hx CVA. Not a heart transplant candidate.   Long standing recurrences of epigastric pain, dyspepsia/nausea/vomiting. Lipase consistently normal and LFTs normal except for AST into 40s to 60s at times.  07/2010 EGD for epigastric pain, Dr Michail Sermon: normal.  01/2012 non-contrast CT: unremarkable.  08/2013 HPylori Ig G was positive. Not clear this was treated.  08/2013 Ultrasound: likely fatty liver and GB sludge.  09/2013 Colonoscopy cxl'd due to tachycardia.  01/14/2015 GES: normal.  01/08/15 ultrasound: increased liver echo, c/w fatty liver.   Gi meds include Dexilant BID,  Zantac 150 BID, Senakot, OTC laxatives, colace.    Set up for 1/19/17EGD by Dr Ardis Hughs due to intermittent vomiting, post prandial abdominal pain (epigastric radiating to right abdomen), dysphagia/ regurgitaiton . Eliquis to be on hold 2 days beforehand.  Has chronic constipation for last few years, BM about one per week.  Has standing orders for outpt FOBT testing, no specs submitted.  Takes BCs and Aleve on infrequent occasions maybe once every 2 weeks, aware he should not be using these. No ETOH.   Admitted 3 days ago with chest pain, dyspnea.  Gi complaints ongoing, no different than in last few to several  years.    \Redness, swelling, tenderness around PICC site. + chlls and "low grade" fever.   Troponins 0.13, 0.12, 0.11.  Coag negative staph on 1 of 2 blood clx. Bacillus species on PICC line blood clx.  LFTs and Lipase normal.  + AKI.   Hgb 9.8 today: Previous Hgbs:   Ref. Range 09/30/2014 09:31 12/03/2014 13:31 01/07/2015 18:25 01/13/2015 09:32 01/29/2015 16:22 01/16/2015 06:00 01/17/2015 04:41 01/18/2015 04:15  Hemoglobin Latest Ref Range: 13.0-17.0 g/dL 13.9 14.0 12.6 (L) 11.7 (L) 11.8 (L) 10.0 (L) 9.9 (L) 9.8 (L)  HCT Latest Ref Range: 39.0-52.0 % 41.5 42.4 38.3 (L) 35.9 (L) 36.5 (L) 30.5 (L) 30.5 (L) 29.6 (L)  MCV Latest Ref Range: 78.0-100.0 fL 89.6 88.7 88.5 88.6 89.7 89.4 88.7 87.8       Past Medical History  Diagnosis Date  . CHF (congestive heart failure) ([place])   . Sarcoidosis ([place])   . Cardiomyopathy, dilated, nonischemic ([place])     non ischemic by cath  . Acute on chronic systolic heart failure ([place])   . Automatic implantable cardiac defibrillator in situ   . Atrial fibrillation ([place])   . NSVT (nonsustained ventricular tachycardia) ([place])   . GERD (gastroesophageal reflux disease)   . Hypercholesteremia   . Shortness of breath   .  Pacemaker   . Anginal pain ([place])   . Gout   . Hypertension     dr [name]  . Coronary artery disease   . Elevated PSA 06/24/2013  . Myocardial infarction ([place])   . Headache   . Neuropathy ([place])   . Stroke ([place]) 08/2014  . Anxiety   . Chronic kidney disease (CKD), stage III (moderate)     colodonato  . On supplemental oxygen therapy     "2.5L; pretty much all the time; mostly when I'm mobile" (01/16/2015"    Past Surgical History  Procedure Laterality Date  . Pacemaker insertion  2009    with ICD  . Tee without cardioversion  01/17/2011    Procedure: TRANSESOPHAGEAL ECHOCARDIOGRAM (TEE);  Surgeon: Birdie Riddle, MD;  Location: [facility];  Service: Cardiovascular;  Laterality: N/A;  . Cardioversion  01/17/2011    Procedure:  CARDIOVERSION;  Surgeon: Birdie Riddle, MD;  Location: [facility];  Service: Cardiovascular;  Laterality: N/A;  . Cardiac catheterization    . Insert / replace / remove pacemaker    . Anterior cervical decomp/discectomy fusion  08/21/2011    Procedure: ANTERIOR CERVICAL DECOMPRESSION/DISCECTOMY FUSION 2 LEVELS;  Surgeon: Marybelle Killings, MD;  Location: [facility];  Service: Orthopedics;  Laterality: N/A;  C5-6, C6-7 Anterior Cervical Discectomy and Fusion, allograft, plate  . Tee without cardioversion N/A 02/16/2012    Procedure: TRANSESOPHAGEAL ECHOCARDIOGRAM (TEE);  Surgeon: Jolaine Artist, MD;  Location: [facility] ENDOSCOPY;  Service: Cardiovascular;  Laterality: N/A;  original case scheduled under his dad (who is deceased), rescheduled under correct mrn/pt/dob. [place]/dl  . Tee without cardioversion N/A 03/22/2012    Procedure: TRANSESOPHAGEAL ECHOCARDIOGRAM (TEE);  Surgeon: Lelon Perla, MD;  Location: [facility];  Service: Cardiovascular;  Laterality: N/A;  . Cardioversion N/A 03/22/2012    Procedure: CARDIOVERSION;  Surgeon: Lelon Perla, MD;  Location: [facility] ENDOSCOPY;  Service: Cardiovascular;  Laterality: N/A;  . Cardioversion N/A 04/26/2012    Procedure: CARDIOVERSION;  Surgeon: Jolaine Artist, MD;  Location: [facility] ENDOSCOPY;  Service: Cardiovascular;  Laterality: N/A;  . Central venous catheter tunneled insertion single lumen  09/16/2012    right IJ  . Cardioversion N/A 11/08/2012    Procedure: CARDIOVERSION;  Surgeon: Jolaine Artist, MD;  Location: [facility] ENDOSCOPY;  Service: Cardiovascular;  Laterality: N/A;  . Back surgery  1987, lower    Ruptured disk repair  . Peripherally inserted central catheter insertion  2013  . Right heart catheterization N/A 01/26/2012    Procedure: RIGHT HEART CATH;  Surgeon: Jolaine Artist, MD;  Location: [facility] CATH LAB;  Service: Cardiovascular;  Laterality: N/A;  . Right heart catheterization N/A 04/30/2013    Procedure: RIGHT HEART CATH;  Surgeon: Jolaine Artist, MD;  Location: [facility] CATH LAB;  Service: Cardiovascular;  Laterality: N/A;    Prior to Admission medications   Medication Sig Start Date End Date Taking? Authorizing Provider  allopurinol (ZYLOPRIM) 100 MG tablet Take 1 tablet (100 mg total) by mouth daily. 02/05/12  Yes Wynetta Emery, PA-C  ALPRAZolam Duanne Moron) 0.5 MG tablet Take 1 tablet (0.5 mg total) by mouth 3 (three) times daily as needed for anxiety. 12/04/14  Yes Debbrah Alar, NP  amiodarone (PACERONE) 200 MG tablet Take 200 mg by mouth 2 (two) times daily.   Yes Historical Provider, MD  apixaban (ELIQUIS) 5 MG TABS tablet Take 1 tablet (5 mg total) by mouth 2 (two) times daily. 09/24/14  Yes Amy Estrella Deeds, NP  atorvastatin (  LIPITOR) 80 MG tablet Take 1 tablet (80 mg total) by mouth daily. 10/17/14  Yes Debbrah Alar, NP  carvedilol (COREG) 6.25 MG tablet TAKE 1 TABLET (6.25 MG TOTAL) BY MOUTH 2 (TWO) TIMES DAILY WITH A MEAL. 08/20/14  Yes Larey Dresser, MD  dexlansoprazole (DEXILANT) 60 MG capsule Take 1 capsule (60 mg total) by mouth daily. 30 min prior to breakfast 12/24/14  Yes Lori P Hvozdovic, PA-C  diphenhydrAMINE (BENADRYL) 50 MG capsule Take 50 mg by mouth daily as needed for itching.   Yes Historical Provider, MD  eplerenone (INSPRA) 25 MG tablet Take 12.5 mg by mouth daily.  09/11/13  Yes Rande Brunt, NP  fluticasone (FLONASE) 50 MCG/ACT nasal spray PLACE 2 SPRAYS INTO BOTH NOSTRILS DAILY AS NEEDED FOR ALLERGIES. 01/06/15  Yes Debbrah Alar, NP  gabapentin (NEURONTIN) 300 MG capsule Take 300 mg by mouth at bedtime.   Yes Historical Provider, MD  metolazone (ZAROXOLYN) 2.5 MG tablet Take 1 tablet (2.5 mg total) by mouth daily as needed (for fluid retention). 12/14/14  Yes Debbrah Alar, NP  milrinone (PRIMACOR) 20 MG/100ML SOLN infusion Inject 0.375 mcg/kg/min into the vein continuous. 440mg  in 424ml infusion 2.4 ml per hour   Yes Historical Provider, MD  ondansetron (ZOFRAN) 4 MG tablet Take 1 tablet (4 mg  total) by mouth every 8 (eight) hours as needed for nausea or vomiting. 12/24/14  Yes Lori P Hvozdovic, PA-C  oxyCODONE-acetaminophen (PERCOCET) 10-325 MG tablet Take 1 tablet by mouth every 6 (six) hours as needed for pain.  12/07/14  Yes Historical Provider, MD  potassium chloride 20 MEQ TBCR Take 40 mEq by mouth 2 (two) times daily. Patient taking differently: Take 20 mEq by mouth 3 (three) times daily.  12/17/14  Yes Amy D Clegg, NP  ranitidine (ZANTAC) 150 MG capsule Take 1 capsule (150 mg total) by mouth 2 (two) times daily. 01/07/15  Yes Edward Saguier, PA-C  senna-docusate (SENOKOT-S) 8.6-50 MG per tablet Take 1-2 tablets by mouth 2 (two) times daily as needed for mild constipation or moderate constipation. 10/21/13  Yes Debbrah Alar, NP  sertraline (ZOLOFT) 100 MG tablet Take 1 tablet (100 mg total) by mouth daily. 05/13/14  Yes Debbrah Alar, NP  torsemide (DEMADEX) 20 MG tablet Take 40-60 mg by mouth 2 (two) times daily. Takes 3 tablets in the morning and 2 tablets in the evening.   Yes Historical Provider, MD  zolpidem (AMBIEN) 5 MG tablet TAKE 1 AND 1/2 TABLET BY MOUTH EVERY DAY AT BEDTIME 12/08/14  Yes Debbrah Alar, NP    Scheduled Meds: . allopurinol  100 mg Oral Daily  . amiodarone  200 mg Oral BID  . apixaban  5 mg Oral BID  . atorvastatin  80 mg Oral Daily  . carvedilol  6.25 mg Oral BID WC  . famotidine  20 mg Oral BID  . gabapentin  300 mg Oral QHS  . pantoprazole  40 mg Oral Daily  . piperacillin-tazobactam (ZOSYN)  IV  3.375 g Intravenous Q8H  . sertraline  100 mg Oral Daily   Infusions: . sodium chloride    . sodium chloride    . milrinone 0.375 mcg/kg/min (01/18/15 0741)   PRN Meds: sodium chloride, sodium chloride, acetaminophen, ALPRAZolam, diphenhydrAMINE, fluticasone, metoCLOPramide (REGLAN) injection, ondansetron (ZOFRAN) IV, ondansetron, oxyCODONE-acetaminophen **AND** oxyCODONE, senna-docusate, sodium chloride, sodium chloride,  zolpidem   Allergies as of 01/19/2015 - Review Complete 01/24/2015  Allergen Reaction Noted  . Nitroglycerin Other (See Comments) 11/16/2012  . Colchicine Nausea  Only 12/17/2013    Family History  Problem Relation Age of Onset  . Heart disease    . Heart failure    . Stroke    . Anesthesia problems Neg Hx   . Hypotension Neg Hx   . Malignant hyperthermia Neg Hx   . Pseudochol deficiency Neg Hx   . Heart failure Sister   . Heart attack Sister   . Hyperlipidemia Mother   . Colon cancer Father     dx in his early 41's    Social History   Social History  . Marital Status: Divorced    Spouse Name: N/A  . Number of Children: 4  . Years of Education: N/A   Occupational History  . retired    Social History Main Topics  . Smoking status: Never Smoker   . Smokeless tobacco: Never Used  . Alcohol Use: No  . Drug Use: No     Comment: last use august 2004  . Sexual Activity: Not Currently   Other Topics Concern  . Not on file   Social History Narrative   3 sons- one in [place], 1 in [place], on in [place]   Retired from post office- on disability   Completed Bachelors from [employer]    REVIEW OF SYSTEMS: Constitutional:  Fatigue , weakness chronic ENT:  No nose bleeds Pulm:  Increased DOE, no cough. Recalls no testing for OSA CV:  No palpitations, no LE edema.  GU:  No hematuria, no frequency GI:  See HPI Heme:  No excessive bleeding or bruising.  Transfusions:  Does not recall any Neuro:  No headaches, no peripheral tingling or numbness Derm:  No itching, no rash or sores.  Endocrine:  No sweats or chills.  No polyuria or dysuria Immunization:  Reviewed. Up to date flu.  Travel:  None beyond local counties in last few months.    PHYSICAL EXAM: Vital signs in last 24 hours: Filed Vitals:   01/18/15 0500 01/18/15 0900  BP: 137/111 113/86  Pulse: 66 106  Temp: 97.7 F (36.5 C) 97.7 F (36.5 C)  Resp: 17 20   Wt Readings from Last 3 Encounters:  01/18/15 106.051 kg  (233 lb 12.8 oz)  01/14/15 106.232 kg (234 lb 3.2 oz)  01/13/15 107.593 kg (237 lb 3.2 oz)    General: overweight.  Looks unwell. Nodding off during interview.  Head:  No asymmetry or facial edem  Eyes:  No icterus or pallor Ears:  HOH vs AMS,   Nose:  No discharge.  Mouth:  Many teeth gone, remainder are eroded and caries ridden.  Neck:  No mass, no TMG, no JVD Lungs:  Former PICC line site on right chest bandaged.  Lungs clear but diminished BS.  No dyspnea Heart: RRR.  No MRG.  S1/S2 audible Abdomen:  Obese, NT, hypoactiveBS.  Marland Kitchen   Rectal: deferred   Musc/Skeltl: no joint erythema, contractures or swellilng. Extremities:  No CCE  Neurologic:  Oriented x 3, drowsy/somnolent and drifting off mid conversation.  Arouses with moderate stimulus.  Skin:  No rash, no sores Tattoos:  None seen.  Nodes:  No cervical adenopathy   Psych:  Affect blunted but cooperative and not grossly depressed. Calm.   Intake/Output from previous day: 01/15 0701 - 01/16 0700 In: 1500 [P.O.:1500] Out: 500 [Urine:500] Intake/Output this shift: Total I/O In: 360 [P.O.:360] Out: -   LAB RESULTS:  Recent Labs  01/16/15 0600 01/17/15 0441 01/18/15 0415  WBC 7.1 5.1 5.4  HGB 10.0* 9.9* 9.8*  HCT 30.5* 30.5* 29.6*  PLT 232 221 241   BMET Lab Results  Component Value Date   NA 135 01/18/2015   NA 132* 01/17/2015   NA 138 01/16/2015   K 4.9 01/18/2015   K 4.2 01/17/2015   K 3.2* 01/16/2015   CL 105 01/18/2015   CL 104 01/17/2015   CL 104 01/16/2015   CO2 23 01/18/2015   CO2 21* 01/17/2015   CO2 27 01/16/2015   GLUCOSE 108* 01/18/2015   GLUCOSE 130* 01/17/2015   GLUCOSE 104* 01/16/2015   BUN 30* 01/18/2015   BUN 24* 01/17/2015   BUN 16 01/16/2015   CREATININE 2.93* 01/18/2015   CREATININE 2.39* 01/17/2015   CREATININE 1.77* 01/16/2015   CALCIUM 9.0 01/18/2015   CALCIUM 8.9 01/17/2015   CALCIUM 8.7* 01/16/2015   LFT  Recent Labs  01/18/2015 1916 01/16/15 0600  PROT 7.2 6.3*   ALBUMIN 2.8* 2.3*  AST 31 25  ALT 22 19  ALKPHOS 65 52  BILITOT 1.4* 1.5*  BILIDIR 0.5 0.4  IBILI 0.9 1.1*   PT/INR Lab Results  Component Value Date   INR 1.05 07/27/2014   INR 1.07 11/27/2013   INR 1.09 06/09/2013       Component Value Date/Time   LIPASE 27 01/30/2015 1916     RADIOLOGY STUDIES: Dg Chest Port 1 View  01/16/2015  CLINICAL DATA:  Short of breath for 1 week. History of hypertension and CHF well of sarcoidosis. EXAM: PORTABLE CHEST 1 VIEW COMPARISON:  01/13/2015 FINDINGS: Stable enlargement of the cardiac silhouette. No mediastinal or hilar masses or convincing adenopathy. Stable left anterior chest wall AICD. Right central line has been removed since the prior study. There is thickening of the peribronchovascular interstitial markings most evident in the lower lobes. No focal consolidation is seen to suggest pneumonia. No pleural effusion or pneumothorax. IMPRESSION: 1. Findings similar to the most recent prior exam. There is cardiomegaly and mild interstitial thickening. This interstitial thickening has increased when compared to an older prior study from 12/03/2014. Mild congestive heart failure with interstitial edema is suspected. Electronically Signed   By: Lajean Manes M.D.   On: 01/16/2015 14:15    ENDOSCOPIC STUDIES: Per HPI  IMPRESSION:   *  Chronic epigastric/abdomina pain with vomiting/dysphagia.for several years despite max H2 blocker and PPI.  Normal previous EGD.  Fatty liver per ultrasounds but minimal previous elevation of AST.   EGD set for 1/19 at [facility], off Eliquis.   *  Non cardiac chest pain,.   *  CHF.  On home Milrinone for last few years.  Per cardiology note 1/16:  fluid overloaded.   *  Infected PICC line, now removed.  Plan to replace. On Zosyn.    *  AKI, baseline stage 3 CKD.   *  Chronic Eliquis for Afib/flutter, hx CVA. Not on hold.     PLAN:     *  Cards planning TEE to look for vegatation on ICD.   *  ? Should we  get barium esophagram? .     Azucena Freed  01/18/2015, 10:26 AM Pager: [place] Attending   I have taken an interval history, reviewed the chart and examined the patient. I agree with the Advanced Practitioner's note, impression and recommendations.   Since PA saw him he had resp decompensation and is on BiPap. So no studies now.  I think an EGD will make most sense - prior  to any TEE since he is having dysphagia.  Will need improved resp status first.  Gatha Mayer, MD, [facility] [phone] (pager) [phone] after 5 PM, weekends and holidays  01/18/2015 6:04 PM

## 2015-01-18 NOTE — Progress Notes (Addendum)
Advanced Heart Failure Rounding Note   Subjective:     Admitted for CP at PICC line site and possible PICC line infection. + Chills. No fever. Coughing.   BCx PICC with gram variable rods. BCx left hand gram positive cocci. Blood R arm no growth x 2 days. UCx inconclusive.   Creatinine trending up steeply 1.77 => 2.39 => 2.93. + ~3L from admission. CXR and exam looks fluid overloaded.  Breathing worse today, Class IIIB-IV symptoms. Has had intermittent confusion.   Objective:   Weight Range:  Vital Signs:   Temp:  [97.4 F (36.3 C)-97.7 F (36.5 C)] 97.7 F (36.5 C) (01/16 0500) Pulse Rate:  [53-88] 66 (01/16 0500) Resp:  [17-21] 17 (01/16 0500) BP: (90-137)/(52-111) 137/111 mmHg (01/16 0500) SpO2:  [96 %-98 %] 98 % (01/16 0500) Weight:  [233 lb 12.8 oz (106.051 kg)] 233 lb 12.8 oz (106.051 kg) (01/16 0500) Last BM Date: 01/14/15  Weight change: Filed Weights   01/16/15 0700 01/17/15 0500 01/18/15 0500  Weight: 226 lb 12.8 oz (102.876 kg) 232 lb (105.235 kg) 233 lb 12.8 oz (106.051 kg)    Intake/Output:   Intake/Output Summary (Last 24 hours) at 01/18/15 0900 Last data filed at 01/18/15 0830  Gross per 24 hour  Intake   1500 ml  Output    500 ml  Net   1000 ml     Physical Exam: General:  Sitting on side, increased work of breathing. fatigued HEENT: normal Neck: supple. JVP - hard to see. Looks up . Carotids 2+ bilat; no bruits. No thyromegaly or nodule noted Cor: PMI laterally displaced. Regular rate & rhythm. +s3 R chest wall PICC line out. Site bandaged. No erythema or discharge.  Lungs: Mildly diminished bases.  Abdomen: soft, NT, mildly distended, no HSM. No bruits or masses. +BS  Extremities: no cyanosis, clubbing, rash, Trace ankle edema. Neuro: alert & orientedx3, cranial nerves grossly intact. moves all 4 extremities w/o difficulty. Affect pleasant  Telemetry: Reviewed personally, Afib 80-90s  Labs: Basic Metabolic Panel:  Recent Labs Lab  01/16/2015 1622 01/16/15 0004 01/16/15 0600 01/17/15 0441 01/17/15 1805 01/18/15 0415  NA 141  --  138 132*  --  135  K 3.2*  --  3.2* 4.2  --  4.9  CL 103  --  104 104  --  105  CO2 25  --  27 21*  --  23  GLUCOSE 93  --  104* 130*  --  108*  BUN 17  --  16 24*  --  30*  CREATININE 1.72*  --  1.77* 2.39*  --  2.93*  CALCIUM 9.7  --  8.7* 8.9  --  9.0  MG  --  1.5*  --   --  2.1  --   PHOS  --  2.8  --   --   --   --     Liver Function Tests:  Recent Labs Lab 02/02/2015 1916 01/16/15 0600  AST 31 25  ALT 22 19  ALKPHOS 65 52  BILITOT 1.4* 1.5*  PROT 7.2 6.3*  ALBUMIN 2.8* 2.3*    Recent Labs Lab 02/01/2015 1916  LIPASE 27   No results for input(s): AMMONIA in the last 168 hours.  CBC:  Recent Labs Lab 01/13/15 0932 01/26/2015 1622 01/16/15 0600 01/17/15 0441 01/18/15 0415  WBC 9.0 6.2 7.1 5.1 5.4  NEUTROABS 6.5 3.8 4.8 2.8 3.3  HGB 11.7* 11.8* 10.0* 9.9* 9.8*  HCT 35.9* 36.5* 30.5*  30.5* 29.6*  MCV 88.6 89.7 89.4 88.7 87.8  PLT 298.0 276 232 221 241    Cardiac Enzymes:  Recent Labs Lab 01/16/15 0004 01/16/15 0600 01/16/15 1700  TROPONINI 0.13* 0.12* 0.11*    BNP: BNP (last 3 results)  Recent Labs  07/27/14 1928 09/11/14 1052 01/14/2015 1916  BNP 407.5* 262.5* 700.0*    ProBNP (last 3 results) No results for input(s): PROBNP in the last 8760 hours.    Other results:  Imaging: Dg Chest Port 1 View  01/16/2015  CLINICAL DATA:  Short of breath for 1 week. History of hypertension and CHF well of sarcoidosis. EXAM: PORTABLE CHEST 1 VIEW COMPARISON:  01/29/2015 FINDINGS: Stable enlargement of the cardiac silhouette. No mediastinal or hilar masses or convincing adenopathy. Stable left anterior chest wall AICD. Right central line has been removed since the prior study. There is thickening of the peribronchovascular interstitial markings most evident in the lower lobes. No focal consolidation is seen to suggest pneumonia. No pleural effusion or  pneumothorax. IMPRESSION: 1. Findings similar to the most recent prior exam. There is cardiomegaly and mild interstitial thickening. This interstitial thickening has increased when compared to an older prior study from 12/03/2014. Mild congestive heart failure with interstitial edema is suspected. Electronically Signed   By: Lajean Manes M.D.   On: 01/16/2015 14:15     Medications:     Scheduled Medications: . allopurinol  100 mg Oral Daily  . amiodarone  200 mg Oral BID  . apixaban  5 mg Oral BID  . atorvastatin  80 mg Oral Daily  . carvedilol  6.25 mg Oral BID WC  . famotidine  20 mg Oral BID  . gabapentin  300 mg Oral QHS  . pantoprazole  40 mg Oral Daily  . piperacillin-tazobactam (ZOSYN)  IV  3.375 g Intravenous Q8H  . potassium chloride  40 mEq Oral BID  . sertraline  100 mg Oral Daily  . spironolactone  25 mg Oral Daily  . torsemide  40 mg Oral q1800  . torsemide  60 mg Oral Q24H  . vancomycin  1,250 mg Intravenous Q24H    Infusions: . sodium chloride    . sodium chloride    . milrinone 0.375 mcg/kg/min (01/18/15 0741)    PRN Medications: sodium chloride, sodium chloride, acetaminophen, ALPRAZolam, diphenhydrAMINE, fluticasone, metoCLOPramide (REGLAN) injection, metolazone, ondansetron (ZOFRAN) IV, ondansetron, oxyCODONE-acetaminophen **AND** oxyCODONE, senna-docusate, sodium chloride, sodium chloride, zolpidem   Assessment:   1. PICC line infection 2. Acute on chronic systolic HF on home milrinone. NICM EF 20-25%      -s/p St Jude ICD 3. PAF in NSR on amio 4. CKD stage III. Stable 5. H/o VT/VF 6. CP - due to PICC line site infection 7. Hypokalemia 8. Anemia   Plan/Discussion:     BCx PICC with gram variable rods. BCx left hand gram positive cocci. Blood R arm no growth x 2 days. UCx inconclusive. Continue vanc/zosyn. Await speciation. PICC line removed 01/16/15. WBC 5.4. Marland Kitchen Afebrile.   Needs TEE to look for vegetation on ICD.   Up 4 lbs and + 3L from  admission. Creatinine trending up steeply. 1.77 => 2.39 => 2.93. Will do vanc trough vs vanc random per pharmacy.  BNP 700 on admit. CXR looks wet. Looks overloaded on exam. Will place central line for Coox and CVP. Hold diuretics for now with Cr bump. Will re-evaluate after line placement.   Continue amio and apixaban for PAF. K+ stable.   OK to run  milrinone peripherally for now.   Hgb slowly trending down, no obvious bleeding. 11.8 => 10.0 => 9.9 => 9.8   Length of Stay: 3  Shirley Friar PA-C 01/18/2015, 8:59 AM  Advanced Heart Failure Team Pager [phone] (M-F; 7a - 4p)  Please contact [facility] for night-coverage after hours (4p -7a ) and weekends on [email or web address]  Patient seen and examined with Oda Kilts, PA-C. We discussed all aspects of the encounter. I agree with the assessment and plan as stated above.   He looks much worse today. Volume status appears up. Here is much more dyspneic and fatigued. CXR shows worsening edema and BNP up. However creatinine getting worse with IV lasix. Suspected low output.  We d/w CCM and central line place. Initial co-ox 20% consistent with profound cardiogenic shock despite milrinone. Will repeat co-ox. If < 40% will transfer to ICU for additional inotropic support. With renal failure he is not candidate for advanced therapies.   Bcx 1/3 staph and 1/2 bacillus species. PICC is out. Continue abx. Will likely need TEE depending on course.   The patient is critically ill with multiple organ systems failure and requires high complexity decision making for assessment and support, frequent evaluation and titration of therapies, application of advanced monitoring technologies and extensive interpretation of multiple databases.   Critical Care Time devoted to patient care services described in this note is 35 Minutes.  Bensimhon, Daniel,MD 2:43 PM

## 2015-01-19 ENCOUNTER — Telehealth: Payer: Self-pay

## 2015-01-19 DIAGNOSIS — N189 Chronic kidney disease, unspecified: Secondary | ICD-10-CM

## 2015-01-19 DIAGNOSIS — R570 Cardiogenic shock: Secondary | ICD-10-CM | POA: Insufficient documentation

## 2015-01-19 DIAGNOSIS — I5023 Acute on chronic systolic (congestive) heart failure: Secondary | ICD-10-CM | POA: Insufficient documentation

## 2015-01-19 DIAGNOSIS — N179 Acute kidney failure, unspecified: Secondary | ICD-10-CM

## 2015-01-19 LAB — HEPATIC FUNCTION PANEL
ALK PHOS: 48 U/L (ref 38–126)
ALT: 34 U/L (ref 17–63)
AST: 70 U/L — ABNORMAL HIGH (ref 15–41)
Albumin: 2.4 g/dL — ABNORMAL LOW (ref 3.5–5.0)
BILIRUBIN INDIRECT: 0.9 mg/dL (ref 0.3–0.9)
BILIRUBIN TOTAL: 1.2 mg/dL (ref 0.3–1.2)
Bilirubin, Direct: 0.3 mg/dL (ref 0.1–0.5)
Total Protein: 6.6 g/dL (ref 6.5–8.1)

## 2015-01-19 LAB — CBC WITH DIFFERENTIAL/PLATELET
BASOS ABS: 0 10*3/uL (ref 0.0–0.1)
BASOS PCT: 1 %
Eosinophils Absolute: 0.1 10*3/uL (ref 0.0–0.7)
Eosinophils Relative: 2 %
HEMATOCRIT: 29 % — AB (ref 39.0–52.0)
HEMOGLOBIN: 9.7 g/dL — AB (ref 13.0–17.0)
Lymphocytes Relative: 22 %
Lymphs Abs: 1.3 10*3/uL (ref 0.7–4.0)
MCH: 29.6 pg (ref 26.0–34.0)
MCHC: 33.4 g/dL (ref 30.0–36.0)
MCV: 88.4 fL (ref 78.0–100.0)
MONOS PCT: 9 %
Monocytes Absolute: 0.5 10*3/uL (ref 0.1–1.0)
NEUTROS ABS: 4 10*3/uL (ref 1.7–7.7)
NEUTROS PCT: 66 %
Platelets: 240 10*3/uL (ref 150–400)
RBC: 3.28 MIL/uL — AB (ref 4.22–5.81)
RDW: 14.4 % (ref 11.5–15.5)
WBC: 6 10*3/uL (ref 4.0–10.5)

## 2015-01-19 LAB — CULTURE, BLOOD (ROUTINE X 2)

## 2015-01-19 LAB — BASIC METABOLIC PANEL
ANION GAP: 10 (ref 5–15)
BUN: 34 mg/dL — ABNORMAL HIGH (ref 6–20)
CALCIUM: 9.3 mg/dL (ref 8.9–10.3)
CO2: 25 mmol/L (ref 22–32)
Chloride: 99 mmol/L — ABNORMAL LOW (ref 101–111)
Creatinine, Ser: 2.98 mg/dL — ABNORMAL HIGH (ref 0.61–1.24)
GFR, EST AFRICAN AMERICAN: 24 mL/min — AB (ref >60)
GFR, EST NON AFRICAN AMERICAN: 21 mL/min — AB (ref >60)
Glucose, Bld: 151 mg/dL — ABNORMAL HIGH (ref 65–99)
Potassium: 4 mmol/L (ref 3.5–5.1)
SODIUM: 134 mmol/L — AB (ref 135–145)

## 2015-01-19 LAB — CARBOXYHEMOGLOBIN
CARBOXYHEMOGLOBIN: 1.1 % (ref 0.5–1.5)
Carboxyhemoglobin: 1.2 % (ref 0.5–1.5)
METHEMOGLOBIN: 0.7 % (ref 0.0–1.5)
Methemoglobin: 0.9 % (ref 0.0–1.5)
O2 SAT: 42.2 %
O2 SAT: 61.4 %
TOTAL HEMOGLOBIN: 9.5 g/dL — AB (ref 13.5–18.0)
Total hemoglobin: 10.1 g/dL — ABNORMAL LOW (ref 13.5–18.0)

## 2015-01-19 MED ORDER — NOREPINEPHRINE BITARTRATE 1 MG/ML IV SOLN: 10.0000 ug/min | INTRAVENOUS | Status: DC

## 2015-01-19 MED ORDER — NOREPINEPHRINE BITARTRATE 1 MG/ML IV SOLN
10.0000 ug/min | INTRAVENOUS | Status: DC
  Administered 2015-01-19: 10 ug/min via INTRAVENOUS
  Filled 2015-01-19 (×2): qty 16

## 2015-01-19 MED ORDER — FAMOTIDINE 20 MG PO TABS
20.0000 mg | ORAL_TABLET | Freq: Every day | ORAL | Status: DC
  Administered 2015-01-19: 20 mg via ORAL
  Filled 2015-01-19: qty 1

## 2015-01-19 MED ORDER — DOBUTAMINE IN D5W 4-5 MG/ML-% IV SOLN
7.5000 ug/kg/min | INTRAVENOUS | Status: DC
  Administered 2015-01-19: 5 ug/kg/min via INTRAVENOUS
  Filled 2015-01-19 (×2): qty 250

## 2015-01-19 MED ORDER — DEXTROSE 5 % IV SOLN: 10.0000 ug/min | INTRAVENOUS | Status: DC

## 2015-01-19 NOTE — Telephone Encounter (Signed)
Pt has been cancelled and Jill at [facility] notified

## 2015-01-19 NOTE — Telephone Encounter (Signed)
-----   Message from Milus Banister, MD sent at 01/19/2015  7:16 AM EST ----- Regarding: RE: can cancel egd w/ you Ok, thanks   Milah Recht, Can you cancel his upcoming EGD (was going to be this Thursday at [facility]).    dj  ----- Message -----    From: Gatha Mayer, MD    Sent: 01/18/2015   6:08 PM      To: Milus Banister, MD Subject: can cancel egd w/ you                          Currently in 2 H w/ biPap mask on

## 2015-01-19 NOTE — Progress Notes (Signed)
          Daily Rounding Note  01/19/2015, 8:52 AM  LOS: 4 days   SUBJECTIVE:       Transferred to heart ICU early this AM with resp distress, tachy to 140s.  Central line placed and dobutamine, levophed initiated. Bolus of IV Lasix.   Milrinone discontinued. Bipap initiated. MS changes and pt pulling at lines  OBJECTIVE:         Vital signs in last 24 hours:    Temp:  [97.1 F (36.2 C)-98.2 F (36.8 C)] 97.8 F (36.6 C) (01/17 0807) Pulse Rate:  [25-144] 25 (01/17 0756) Resp:  [10-47] 16 (01/17 0757) BP: (77-121)/(57-98) 101/78 mmHg (01/17 0757) SpO2:  [81 %-100 %] 100 % (01/17 0757) FiO2 (%):  [40 %-50 %] 40 % (01/17 0757) Weight:  [105.597 kg (232 lb 12.8 oz)] 105.597 kg (232 lb 12.8 oz) (01/17 0500) Last BM Date: 01/14/15 Filed Weights   01/17/15 0500 01/18/15 0500 01/19/15 0500  Weight: 105.235 kg (232 lb) 106.051 kg (233 lb 12.8 oz) 105.597 kg (232 lb 12.8 oz)   General: Bipap in place, not able to answer questions   Heart: RRR Chest: rough BS,  Labored resps, bipap in place Abdomen: distended, obese but soft.  No BS appreciated.   Extremities: no CCE.  Feet warm. Neuro/Psych:  Unable to assess but reponsive to commands.   Intake/Output from previous day: 01/16 0701 - 01/17 0700 In: 1003.4 [P.O.:480; I.V.:361.4; IV Piggyback:162] Out: 1510 [Urine:1510]  Lab Results:  Recent Labs  01/17/15 0441 01/18/15 0415 01/19/15 0500  WBC 5.1 5.4 6.0  HGB 9.9* 9.8* 9.7*  HCT 30.5* 29.6* 29.0*  PLT 221 241 240   BMET  Recent Labs  01/17/15 0441 01/18/15 0415 01/19/15 0500  NA 132* 135 134*  K 4.2 4.9 4.0  CL 104 105 99*  CO2 21* 23 25  GLUCOSE 130* 108* 151*  BUN 24* 30* 34*  CREATININE 2.39* 2.93* 2.98*  CALCIUM 8.9 9.0 9.3      ASSESMENT:   * Chronic epigastric/abdominal pain with vomiting/dysphagia.for several years despite max H2 blocker and PPI. Normal previous EGD. Fatty liver per ultrasounds  but minimal previous elevation of AST.   * Non cardiac chest pain. Minor elevation Troponins 1/14.   * CHF. On home Milrinone for last few years (discontinued this AM). Per cardiology note 1/16: fluid overloaded. Transferred to ICU with resp distress, requiring pressors.   * Infected PICC line, now removed. Plan to replace. On Zosyn.   * AKI, baseline stage 3 CKD.   * Chronic Eliquis for Afib/flutter, hx CVA. Not on hold.  Rate controlled in 70s, 80s.    PLAN   *  Pt in no condition for EGD.  Once his resp status recovers, consider EGD.  Or. If he ends up on vent, consider EGD then. ? Should his Eliquis be held for 48 hours beforehand?     Azucena Freed  01/19/2015, 8:52 AM Pager: [phone]    [facility] Attending   I have taken an interval history, reviewed the chart and examined the patient. I agree with the Advanced Practitioner's note, impression and recommendations.   Will need to decide when he improves re: EGD, Eliquis holding. We will f/u in a few days. Call us sooner if there is a need.

## 2015-01-19 NOTE — Progress Notes (Signed)
Advanced Heart Failure Rounding Note   Subjective:     Admitted for CP at PICC line site and possible PICC line infection. + Chills. No fever. Coughing.   BCx PICC with gram variable rods. BCx left hand gram positive cocci. Blood R arm no growth x 2 days. UCx inconclusive.   Decompensated 01/19/15. Placed on Bipap, Central line placed. Initial coox 20%, stat repeat 45%. Milrinone increased to 0.5 mcg/kg/min and low dose norepi.  Coox 42.2% this morning despite 0.5 milrinone and 2 norepi. CVP 18. Feels very tired.  Continues to have increased work of breathing. No lightheadedness or dizziness.   Creatinine trended up over past several days 1.77 => 2.39 => 2.93 => 2.98. Unimpressive diuresis with with 120 mg IV lasix BID yesterday. Remains + ~2L from admission.    Objective:   Weight Range:  Vital Signs:   Temp:  [97.1 F (36.2 C)-98.2 F (36.8 C)] 97.5 F (36.4 C) (01/17 0400) Pulse Rate:  [29-144] 87 (01/17 0615) Resp:  [10-47] 10 (01/17 0615) BP: (77-121)/(57-98) 103/70 mmHg (01/17 0615) SpO2:  [81 %-100 %] 90 % (01/17 0615) FiO2 (%):  [50 %] 50 % (01/16 1503) Weight:  [232 lb 12.8 oz (105.597 kg)] 232 lb 12.8 oz (105.597 kg) (01/17 0500) Last BM Date: 01/14/15  Weight change: Filed Weights   01/17/15 0500 01/18/15 0500 01/19/15 0500  Weight: 232 lb (105.235 kg) 233 lb 12.8 oz (106.051 kg) 232 lb 12.8 oz (105.597 kg)    Intake/Output:   Intake/Output Summary (Last 24 hours) at 01/19/15 0740 Last data filed at 01/19/15 0600  Gross per 24 hour  Intake 1003.35 ml  Output   1510 ml  Net -506.65 ml     Physical Exam: General:  In bed, very fatigued appearing, increased resp effort. HEENT: normal Neck: supple. JVP - hard to see. Looks up . Carotids 2+ bilat; no bruits. No thyromegaly or nodule noted. L neck central line. Cor: PMI laterally displaced. Irregularly irregular +s3 R chest wall PICC line out. Site bandaged. No erythema or discharge.  Lungs: Decreased  basilar sounds.  Abdomen: soft, NT, mildly distended, no HSM. No bruits or masses. +BS  Extremities: no cyanosis, clubbing, rash, Trace-1+ ankle edema. Neuro: alert & orientedx3, cranial nerves grossly intact. moves all 4 extremities w/o difficulty. Affect flat  Telemetry: Reviewed personally, Afib 80s, PVCs  Labs: Basic Metabolic Panel:  Recent Labs Lab 01/30/2015 1622 01/16/15 0004 01/16/15 0600 01/17/15 0441 01/17/15 1805 01/18/15 0415 01/19/15 0500  NA 141  --  138 132*  --  135 134*  K 3.2*  --  3.2* 4.2  --  4.9 4.0  CL 103  --  104 104  --  105 99*  CO2 25  --  27 21*  --  23 25  GLUCOSE 93  --  104* 130*  --  108* 151*  BUN 17  --  16 24*  --  30* 34*  CREATININE 1.72*  --  1.77* 2.39*  --  2.93* 2.98*  CALCIUM 9.7  --  8.7* 8.9  --  9.0 9.3  MG  --  1.5*  --   --  2.1  --   --   PHOS  --  2.8  --   --   --   --   --     Liver Function Tests:  Recent Labs Lab 01/21/2015 1916 01/16/15 0600  AST 31 25  ALT 22 19  ALKPHOS 65 52  BILITOT 1.4* 1.5*  PROT 7.2 6.3*  ALBUMIN 2.8* 2.3*    Recent Labs Lab 01/14/2015 1916  LIPASE 27    Recent Labs Lab 01/18/15 1041  AMMONIA 40*    CBC:  Recent Labs Lab 01/05/2015 1622 01/16/15 0600 01/17/15 0441 01/18/15 0415 01/19/15 0500  WBC 6.2 7.1 5.1 5.4 6.0  NEUTROABS 3.8 4.8 2.8 3.3 4.0  HGB 11.8* 10.0* 9.9* 9.8* 9.7*  HCT 36.5* 30.5* 30.5* 29.6* 29.0*  MCV 89.7 89.4 88.7 87.8 88.4  PLT 276 232 221 241 240    Cardiac Enzymes:  Recent Labs Lab 01/16/15 0004 01/16/15 0600 01/16/15 1700  TROPONINI 0.13* 0.12* 0.11*    BNP: BNP (last 3 results)  Recent Labs  07/27/14 1928 09/11/14 1052 01/04/2015 1916  BNP 407.5* 262.5* 700.0*    ProBNP (last 3 results) No results for input(s): PROBNP in the last 8760 hours.    Other results:  Imaging: Dg Chest Port 1 View  01/18/2015  CLINICAL DATA:  Central line placement. EXAM: PORTABLE CHEST 1 VIEW COMPARISON:  Single view of the chest 01/16/2015.  FINDINGS: The patient has a new left IJ catheter. Tip of the catheter is just within the superior vena cava. No pneumothorax. There is cardiomegaly without edema. The lungs appear clear. AICD is noted. IMPRESSION: Left IJ catheter tip is just within the superior vena cava. Negative for pneumothorax. Cardiomegaly barium Electronically Signed   By: Inge Rise M.D.   On: 01/18/2015 13:32     Medications:     Scheduled Medications: . allopurinol  100 mg Oral Daily  . amiodarone  200 mg Oral BID  . apixaban  5 mg Oral BID  . atorvastatin  80 mg Oral Daily  . famotidine  20 mg Oral QHS  . furosemide  120 mg Intravenous BID  . gabapentin  300 mg Oral QHS  . pantoprazole  40 mg Oral Daily  . piperacillin-tazobactam (ZOSYN)  IV  3.375 g Intravenous Q8H  . sertraline  100 mg Oral Daily  . vancomycin  1,000 mg Intravenous Q24H    Infusions: . sodium chloride    . sodium chloride    . milrinone 0.5 mcg/kg/min (01/18/15 2150)  . norepinephrine (LEVOPHED) Adult infusion      PRN Medications: sodium chloride, sodium chloride, acetaminophen, ALPRAZolam, diphenhydrAMINE, fluticasone, metoCLOPramide (REGLAN) injection, ondansetron (ZOFRAN) IV, ondansetron, oxyCODONE-acetaminophen **AND** oxyCODONE, senna-docusate, sodium chloride, sodium chloride, zolpidem   Assessment:   1. PICC line infection 2. Acute on chronic systolic HF on home milrinone. NICM EF 20-25%      -s/p St Jude ICD 3. PAF in NSR on amio 4. CKD stage III. Stable 5. H/o VT/VF 6. CP - due to PICC line site infection 7. Hypokalemia 8. Anemia   Plan/Discussion:     BCx PICC with gram variable rods. BCx left hand coag negative staph. Blood R arm no growth 32 days. UCx inconclusive. Continue vanc/zosyn. Await speciation. PICC line removed 01/16/15. WBC 6.0 today. Afebrile. May need TEE.  He is not diuresing well and Coox remains low. Will change milrinone to dobutamine 5 mcg/kg/min. Will increase norepi to 10. Send stat  repeat coox. And will recheck this afternoon with switch to dobutamine.  He is not a candidate for advance therapies with his renal failure.   No BB for now with low output. CVP 18  Creatinine remains elevated but stable 2.93 => 2.98. Vanc trough high yesterday but drawn early;  adjusted accordingly. With poor UO could also consider bladder scan +/-  foley.    Continue amio and apixaban for PAF. K+ stable.   Hgb continues to gradually trend down 11.8 => 10.0 => 9.9 => 9.8 => 9.7. GI on board. No obvious bleeding.   Length of Stay: 4  Shirley Friar PA-C 01/19/2015, 7:40 AM  Advanced Heart Failure Team Pager [phone] (M-F; 7a - 4p)  Please contact [facility] for night-coverage after hours (4p -7a ) and weekends on [email or web address]   Patient seen and examined with Oda Kilts, PA-C. We discussed all aspects of the encounter. I agree with the assessment and plan as stated above.   He was moved to CCU yesterday for profound cardiogenic shock and progressive renal failure. Now on milrinone 0.5 and low dose levophed. Co-ox still low. Will switch milrinone to dobutamine. Increase levophed as needed. CVP remains high. Continue high-dose lasix. Hopefully will not require CVVHD. Not candidate for VAD due CKD IV. Blood culture data is equivocal. Will need to discuss with ID. HGb relatively stable. Agree with GI tht he is not candidate for EGD currently.   The patient is critically ill with multiple organ systems failure and requires high complexity decision making for assessment and support, frequent evaluation and titration of therapies, application of advanced monitoring technologies and extensive interpretation of multiple databases.   Critical Care Time devoted to patient care services described in this note is 35 Minutes.  Joslyne Marshburn,MD 4:30 PM

## 2015-01-20 ENCOUNTER — Inpatient Hospital Stay: Admission: RE | Admit: 2015-01-20 | Payer: Medicare Other | Source: Ambulatory Visit

## 2015-01-20 DIAGNOSIS — R7881 Bacteremia: Secondary | ICD-10-CM

## 2015-01-20 DIAGNOSIS — J9601 Acute respiratory failure with hypoxia: Secondary | ICD-10-CM

## 2015-01-20 LAB — CBC WITH DIFFERENTIAL/PLATELET
BASOS ABS: 0 10*3/uL (ref 0.0–0.1)
Basophils Relative: 0 %
EOS ABS: 0.1 10*3/uL (ref 0.0–0.7)
EOS PCT: 2 %
HCT: 31.5 % — ABNORMAL LOW (ref 39.0–52.0)
Hemoglobin: 10.3 g/dL — ABNORMAL LOW (ref 13.0–17.0)
LYMPHS PCT: 25 %
Lymphs Abs: 1.6 10*3/uL (ref 0.7–4.0)
MCH: 29.3 pg (ref 26.0–34.0)
MCHC: 32.7 g/dL (ref 30.0–36.0)
MCV: 89.7 fL (ref 78.0–100.0)
MONO ABS: 0.7 10*3/uL (ref 0.1–1.0)
Monocytes Relative: 10 %
Neutro Abs: 4.2 10*3/uL (ref 1.7–7.7)
Neutrophils Relative %: 63 %
PLATELETS: 280 10*3/uL (ref 150–400)
RBC: 3.51 MIL/uL — ABNORMAL LOW (ref 4.22–5.81)
RDW: 14.8 % (ref 11.5–15.5)
WBC: 6.6 10*3/uL (ref 4.0–10.5)

## 2015-01-20 LAB — BASIC METABOLIC PANEL
Anion gap: 11 (ref 5–15)
BUN: 30 mg/dL — AB (ref 6–20)
CO2: 28 mmol/L (ref 22–32)
CREATININE: 2.72 mg/dL — AB (ref 0.61–1.24)
Calcium: 9.4 mg/dL (ref 8.9–10.3)
Chloride: 99 mmol/L — ABNORMAL LOW (ref 101–111)
GFR calc Af Amer: 27 mL/min — ABNORMAL LOW (ref >60)
GFR calc non Af Amer: 23 mL/min — ABNORMAL LOW (ref >60)
GLUCOSE: 129 mg/dL — AB (ref 65–99)
POTASSIUM: 4 mmol/L (ref 3.5–5.1)
Sodium: 138 mmol/L (ref 135–145)

## 2015-01-20 LAB — POCT I-STAT 3, ART BLOOD GAS (G3+)
ACID-BASE DEFICIT: 1 mmol/L (ref 0.0–2.0)
Bicarbonate: 23.2 mEq/L (ref 20.0–24.0)
O2 SAT: 95 %
PCO2 ART: 33.4 mmHg — AB (ref 35.0–45.0)
Patient temperature: 97.8
TCO2: 24 mmol/L (ref 0–100)
pH, Arterial: 7.447 (ref 7.350–7.450)
pO2, Arterial: 72 mmHg — ABNORMAL LOW (ref 80.0–100.0)

## 2015-01-20 LAB — CARBOXYHEMOGLOBIN
CARBOXYHEMOGLOBIN: 0.9 % (ref 0.5–1.5)
CARBOXYHEMOGLOBIN: 1.3 % (ref 0.5–1.5)
CARBOXYHEMOGLOBIN: 0.9 % (ref 0.5–1.5)
Carboxyhemoglobin: 0.9 % (ref 0.5–1.5)
METHEMOGLOBIN: 0.9 % (ref 0.0–1.5)
Methemoglobin: 0.8 % (ref 0.0–1.5)
Methemoglobin: 0.6 % (ref 0.0–1.5)
Methemoglobin: 0.7 % (ref 0.0–1.5)
O2 SAT: 47.4 %
O2 SAT: 37 %
O2 Saturation: 31.7 %
O2 Saturation: 29.3 %
TOTAL HEMOGLOBIN: 10.2 g/dL — AB (ref 13.5–18.0)
TOTAL HEMOGLOBIN: 10.4 g/dL — AB (ref 13.5–18.0)
TOTAL HEMOGLOBIN: 9.3 g/dL — AB (ref 13.5–18.0)
Total hemoglobin: 9.5 g/dL — ABNORMAL LOW (ref 13.5–18.0)

## 2015-01-20 LAB — CULTURE, BLOOD (ROUTINE X 2): Culture: NO GROWTH

## 2015-01-20 LAB — MAGNESIUM: Magnesium: 1.9 mg/dL (ref 1.7–2.4)

## 2015-01-20 LAB — AMMONIA: AMMONIA: 24 umol/L (ref 9–35)

## 2015-01-20 MED ORDER — MIDAZOLAM HCL 2 MG/2ML IJ SOLN
5.0000 mg | Freq: Once | INTRAMUSCULAR | Status: AC
  Administered 2015-01-20: 5 mg via INTRAVENOUS

## 2015-01-20 MED ORDER — FENTANYL CITRATE (PF) 100 MCG/2ML IJ SOLN
INTRAMUSCULAR | Status: AC
  Filled 2015-01-20: qty 2

## 2015-01-20 MED ORDER — AMIODARONE HCL IN DEXTROSE 360-4.14 MG/200ML-% IV SOLN
60.0000 mg/h | INTRAVENOUS | Status: DC
  Administered 2015-01-20 (×2): 60 mg/h via INTRAVENOUS
  Filled 2015-01-20 (×2): qty 200

## 2015-01-20 MED ORDER — MAGNESIUM SULFATE 2 GM/50ML IV SOLN
2.0000 g | Freq: Once | INTRAVENOUS | Status: AC
  Administered 2015-01-20: 2 g via INTRAVENOUS
  Filled 2015-01-20: qty 50

## 2015-01-20 MED ORDER — FENTANYL CITRATE (PF) 2500 MCG/50ML IJ SOLN
300.0000 ug/h | INTRAMUSCULAR | Status: DC
  Administered 2015-01-20 – 2015-01-21 (×3): 200 ug/h via INTRAVENOUS
  Administered 2015-01-22: 300 ug/h via INTRAVENOUS
  Administered 2015-01-22: 200 ug/h via INTRAVENOUS
  Filled 2015-01-20 (×6): qty 50

## 2015-01-20 MED ORDER — ONDANSETRON HCL 4 MG/2ML IJ SOLN
4.0000 mg | Freq: Four times a day (QID) | INTRAMUSCULAR | Status: DC | PRN
  Administered 2015-01-20: 4 mg via INTRAVENOUS

## 2015-01-20 MED ORDER — FENTANYL CITRATE (PF) 100 MCG/2ML IJ SOLN
50.0000 ug | Freq: Once | INTRAMUSCULAR | Status: AC
  Administered 2015-01-20: 50 ug via INTRAVENOUS

## 2015-01-20 MED ORDER — MIDAZOLAM HCL 2 MG/2ML IJ SOLN
INTRAMUSCULAR | Status: AC
  Filled 2015-01-20: qty 6

## 2015-01-20 MED ORDER — AMIODARONE LOAD VIA INFUSION
150.0000 mg | Freq: Once | INTRAVENOUS | Status: AC
  Administered 2015-01-20: 150 mg via INTRAVENOUS
  Filled 2015-01-20: qty 83.34

## 2015-01-20 MED ORDER — SODIUM CHLORIDE 0.9 % IV SOLN
5.0000 mg/h | INTRAVENOUS | Status: DC
  Administered 2015-01-20 – 2015-01-22 (×6): 5 mg/h via INTRAVENOUS
  Filled 2015-01-20 (×6): qty 10

## 2015-01-20 MED ORDER — AMIODARONE HCL IN DEXTROSE 360-4.14 MG/200ML-% IV SOLN: 30.0000 mg/h | INTRAVENOUS | Status: DC

## 2015-01-20 MED ORDER — ONDANSETRON HCL 4 MG/2ML IJ SOLN
INTRAMUSCULAR | Status: AC
  Filled 2015-01-20: qty 2

## 2015-01-20 MED ORDER — FUROSEMIDE 10 MG/ML IJ SOLN
15.0000 mg/h | INTRAVENOUS | Status: DC
  Administered 2015-01-20: 15 mg/h via INTRAVENOUS
  Filled 2015-01-20 (×3): qty 25

## 2015-01-20 NOTE — Progress Notes (Signed)
   Patient continues to deteriorate with cardiogenic shock. Very dyspneic. Severe ab pain/gut ischemia. Writhing in bed. He has exhausted options for advanced therapies.   Now asking for comfort care. Will make DNR.   Start versed/fentanyl drips for comfort.   Bensimhon, Daniel,MD 2:56 PM

## 2015-01-20 NOTE — Discharge Instructions (Signed)
You have been seen today for chest pain. Your imaging and lab tests showed no abnormalities. Follow up with PCP as needed. Return to ED should symptoms worsen.  Information on my medicine - ELIQUIS (apixaban) Why was Eliquis prescribed for you? Eliquis was prescribed for you to reduce the risk of a blood clot forming that can cause a stroke if you have a medical condition called atrial fibrillation (a type of irregular heartbeat).  What do You need to know about Eliquis ? Take your Eliquis TWICE DAILY - one tablet in the morning and one tablet in the evening with or without food. If you have difficulty swallowing the tablet whole please discuss with your pharmacist how to take the medication safely.  Take Eliquis exactly as prescribed by your doctor and DO NOT stop taking Eliquis without talking to the doctor who prescribed the medication.  Stopping may increase your risk of developing a stroke.  Refill your prescription before you run out.  After discharge, you should have regular check-up appointments with your healthcare provider that is prescribing your Eliquis.  In the future your dose may need to be changed if your kidney function or weight changes by a significant amount or as you get older.  What do you do if you miss a dose? If you miss a dose, take it as soon as you remember on the same day and resume taking twice daily.  Do not take more than one dose of ELIQUIS at the same time to make up a missed dose.  Important Safety Information A possible side effect of Eliquis is bleeding. You should call your healthcare provider right away if you experience any of the following: ? Bleeding from an injury or your nose that does not stop. ? Unusual colored urine (red or dark brown) or unusual colored stools (red or black). ? Unusual bruising for unknown reasons. ? A serious fall or if you hit your head (even if there is no bleeding).  Some medicines may interact with Eliquis and  might increase your risk of bleeding or clotting while on Eliquis. To help avoid this, consult your healthcare provider or pharmacist prior to using any new prescription or non-prescription medications, including herbals, vitamins, non-steroidal anti-inflammatory drugs (NSAIDs) and supplements.  This website has more information on Eliquis (apixaban): http://www.eliquis.com/eliquis/home

## 2015-01-20 NOTE — Progress Notes (Addendum)
Paged for coox 29.3, stat repeat 37%.  Decreased mentation and overbreathing BIPAP. RR 34.    Increased levophed to 20. Dr. Haroldine Laws aware. Will wean down if BP > 130.  Check q 15 for now.   RR going up and down. ABG looked OK. Will follow closely.    Darrell Como [address]" [place], PA-C 01/20/2015 1:29 PM   Patient with increasing lethargy and WOB. Co-ox persistently low despite 2 inotropes. CVP persistently high~18.   Long talk with him about poor prognosis. Not candidate for mechanical support due to CKD and inadequate support system. We discussed code status and he wants to remain full code despite my advice to the contrary.  We called his mother and his significant other (Vicky) and informed them of his worsening condition.  The patient is critically ill with multiple organ systems failure and requires high complexity decision making for assessment and support, frequent evaluation and titration of therapies, application of advanced monitoring technologies and extensive interpretation of multiple databases.   Critical Care Time devoted to patient care services described in this note is an additional 40 minutes.  Bensimhon, Daniel,MD 2:41 PM

## 2015-01-20 NOTE — Progress Notes (Signed)
Advanced Heart Failure Rounding Note   Subjective:     Admitted for CP at PICC line site and possible PICC line infection. + Chills. No fever. Coughing.   BCx PICC with gram variable rods. BCx left hand gram positive cocci. Blood R arm no growth x 2 days. UCx inconclusive.   Decompensated 01/19/15. Moved to CCU Placed on Bipap, Central line placed. Initial coox 20%, stat repeat 45%. Milrinone increased to 0.5 mcg/kg/min and low dose norepi.  Coox remains poor 47.4% this morning despite switch to 5 dobutamine and 10 norepi. CVP 18-19. Continues to have intermittent episodes of SOB. Having shooting RUQ pains. Feels like he's talking to himself and "hallucinating" at times.  Creatinine starting to come down slowly now. 2.98 => 2.72. Diuresis picking up. Out ~4L x 24 hrs and down 6 lbs.   Objective:   Weight Range:  Vital Signs:   Temp:  [97.3 F (36.3 C)-97.8 F (36.6 C)] 97.8 F (36.6 C) (01/18 0400) Pulse Rate:  [25-101] 94 (01/18 0700) Resp:  [12-45] 35 (01/18 0700) BP: (90-134)/(77-120) 111/94 mmHg (01/18 0700) SpO2:  [85 %-100 %] 99 % (01/18 0700) FiO2 (%):  [40 %] 40 % (01/17 0757) Weight:  [226 lb (102.513 kg)] 226 lb (102.513 kg) (01/18 0355) Last BM Date: 01/14/15  Weight change: Filed Weights   01/18/15 0500 01/19/15 0500 01/20/15 0355  Weight: 233 lb 12.8 oz (106.051 kg) 232 lb 12.8 oz (105.597 kg) 226 lb (102.513 kg)    Intake/Output:   Intake/Output Summary (Last 24 hours) at 01/20/15 0734 Last data filed at 01/20/15 0639  Gross per 24 hour  Intake 704.31 ml  Output   4351 ml  Net -3646.69 ml     Physical Exam: General:  In bed, very fatigued appearing, increased resp effort. HEENT: normal Neck: supple. JVP - difficult to assess, looks elevated. Carotids 2+ bilat; no bruits. No thyromegaly or lymphadenopathy. L neck central line. Cor: PMI laterally displaced. Irregularly irregular +s3 R chest wall PICC line out. Site without erythema or discharge.  Mild ecchymosis.   Lungs: Mildly diminished basal sounds.   Abdomen: soft, NT, mildly distended, no HSM. No bruits or masses. +BS  Extremities: no cyanosis, clubbing, rash, Trace-1+ ankle edema. Neuro: alert & orientedx3, cranial nerves grossly intact. moves all 4 extremities w/o difficulty. Affect flat  Telemetry: Reviewed personally, Afib 90s with occasional PVCs  Labs: Basic Metabolic Panel:  Recent Labs Lab 01/16/15 0004 01/16/15 0600 01/17/15 0441 01/17/15 1805 01/18/15 0415 01/19/15 0500 01/20/15 0405  NA  --  138 132*  --  135 134* 138  K  --  3.2* 4.2  --  4.9 4.0 4.0  CL  --  104 104  --  105 99* 99*  CO2  --  27 21*  --  23 25 28   GLUCOSE  --  104* 130*  --  108* 151* 129*  BUN  --  16 24*  --  30* 34* 30*  CREATININE  --  1.77* 2.39*  --  2.93* 2.98* 2.72*  CALCIUM  --  8.7* 8.9  --  9.0 9.3 9.4  MG 1.5*  --   --  2.1  --   --  1.9  PHOS 2.8  --   --   --   --   --   --     Liver Function Tests:  Recent Labs Lab 01/27/2015 1916 01/16/15 0600 01/19/15 0500  AST 31 25 70*  ALT 22 19  34  ALKPHOS 65 52 48  BILITOT 1.4* 1.5* 1.2  PROT 7.2 6.3* 6.6  ALBUMIN 2.8* 2.3* 2.4*    Recent Labs Lab 01/30/2015 1916  LIPASE 27    Recent Labs Lab 01/18/15 1041  AMMONIA 40*    CBC:  Recent Labs Lab 01/16/15 0600 01/17/15 0441 01/18/15 0415 01/19/15 0500 01/20/15 0405  WBC 7.1 5.1 5.4 6.0 6.6  NEUTROABS 4.8 2.8 3.3 4.0 4.2  HGB 10.0* 9.9* 9.8* 9.7* 10.3*  HCT 30.5* 30.5* 29.6* 29.0* 31.5*  MCV 89.4 88.7 87.8 88.4 89.7  PLT 232 221 241 240 280    Cardiac Enzymes:  Recent Labs Lab 01/16/15 0004 01/16/15 0600 01/16/15 1700  TROPONINI 0.13* 0.12* 0.11*    BNP: BNP (last 3 results)  Recent Labs  07/27/14 1928 09/11/14 1052 01/14/2015 1916  BNP 407.5* 262.5* 700.0*    ProBNP (last 3 results) No results for input(s): PROBNP in the last 8760 hours.    Other results:  Imaging: Dg Chest Port 1 View  01/18/2015  CLINICAL DATA:  Central  line placement. EXAM: PORTABLE CHEST 1 VIEW COMPARISON:  Single view of the chest 01/16/2015. FINDINGS: The patient has a new left IJ catheter. Tip of the catheter is just within the superior vena cava. No pneumothorax. There is cardiomegaly without edema. The lungs appear clear. AICD is noted. IMPRESSION: Left IJ catheter tip is just within the superior vena cava. Negative for pneumothorax. Cardiomegaly barium Electronically Signed   By: Inge Rise M.D.   On: 01/18/2015 13:32     Medications:     Scheduled Medications: . allopurinol  100 mg Oral Daily  . amiodarone  200 mg Oral BID  . apixaban  5 mg Oral BID  . atorvastatin  80 mg Oral Daily  . famotidine  20 mg Oral QHS  . furosemide  120 mg Intravenous BID  . gabapentin  300 mg Oral QHS  . pantoprazole  40 mg Oral Daily  . piperacillin-tazobactam (ZOSYN)  IV  3.375 g Intravenous Q8H  . sertraline  100 mg Oral Daily  . vancomycin  1,000 mg Intravenous Q24H    Infusions: . sodium chloride    . sodium chloride    . DOBUTamine 5 mcg/kg/min (01/19/15 2000)  . norepinephrine (LEVOPHED) Adult infusion 10 mcg/min (01/19/15 2000)    PRN Medications: sodium chloride, sodium chloride, acetaminophen, ALPRAZolam, diphenhydrAMINE, fluticasone, metoCLOPramide (REGLAN) injection, ondansetron (ZOFRAN) IV, ondansetron, oxyCODONE-acetaminophen **AND** oxyCODONE, senna-docusate, sodium chloride, sodium chloride, zolpidem   Assessment:   1. Acute on chronic systolic HF on home milrinone. NICM EF 20-25% -> cardiogenic shock      -s/p St Jude ICD 2. Acute hypoxic respiratory failure 3. PAF 4. Acute on chronic renal failure stage IV 5. PICC line infection 6. CP - due to PICC line site infection 7. Hypokalemia 8. Anemia  9. H/o VT/VF  Plan/Discussion:     BCx PICC with gram variable rods = bacillus. BCx left hand coag negative staph. Blood R arm no growth x4 days. UCx inconclusive. Continue vanc/zosyn. PICC line removed 01/16/15. WBC  6.6 today. Afebrile. May need TEE.  Now on dobutamine 5 mcg/kg/min and norepi to 10. Coox 47.4% (initially 31 with stat repeat). Will recheck this afternoon. Last night was as high as 61%. Diuresis now picking up. Hopefully he is turning the corner.  With poor response of his coox, concerned for his short term prognosis. Continue current diuretics.   With reported intermittent confusion will recheck Ammonia.   Creatinine now  down slightly 2.98 => 2.72. Vanc per pharm.     Continue amio and apixaban for PAF. K+ stable.   Hgb now stable.  Not candidate for EGD in current condition. GI following.    Length of Stay: 5  Shirley Friar PA-C 01/20/2015, 7:34 AM  Advanced Heart Failure Team Pager [phone] (M-F; 7a - 4p)  Please contact [facility] for night-coverage after hours (4p -7a ) and weekends on [email or web address]  Patient seen and examined with Oda Kilts, PA-C. We discussed all aspects of the encounter. I agree with the assessment and plan as stated above.   He remains extremely tenuous. Co-ox low despite 2 intoropes. Very volume overload. Dyspneic at rest. Will increase dobutamine to 7.5. Continue noepi. Repeat co-ox in 1 hour. Change to lasix drip at 15/hr. Suspect AF complicating matters. Will switch to IV amio. Continue apixaban. May benefit from DC-CV when respiratory status more stable. [place] equivocal. Will d/w ID. Continue vanc/zosyn for now but can likely narrow.   The patient is critically ill with multiple organ systems failure and requires high complexity decision making for assessment and support, frequent evaluation and titration of therapies, application of advanced monitoring technologies and extensive interpretation of multiple databases.   Critical Care Time devoted to patient care services described in this note is 35 Minutes.   Kevonta Phariss,MD 8:56 AM

## 2015-01-20 NOTE — Progress Notes (Signed)
Full assessment not completed due to comfort care measures. Pt on versed and fentanyl drips. Family at bedside. Will continue to monitor and provide support to family.

## 2015-01-21 ENCOUNTER — Encounter (HOSPITAL_COMMUNITY): Admission: AD | Payer: Self-pay | Source: Ambulatory Visit

## 2015-01-21 ENCOUNTER — Ambulatory Visit (HOSPITAL_COMMUNITY): Admission: AD | Admit: 2015-01-21 | Payer: Medicare Other | Source: Ambulatory Visit | Admitting: Gastroenterology

## 2015-01-21 HISTORY — DX: Anxiety disorder, unspecified: F41.9

## 2015-01-21 HISTORY — DX: Dependence on supplemental oxygen: Z99.81

## 2015-01-21 SURGERY — ESOPHAGOGASTRODUODENOSCOPY (EGD) WITH PROPOFOL
Anesthesia: Monitor Anesthesia Care

## 2015-01-21 MED ORDER — SODIUM CHLORIDE 0.9 % IJ SOLN: 10.0000 mL | Freq: Two times a day (BID) | INTRAMUSCULAR | Status: DC

## 2015-01-21 MED ORDER — ATROPINE SULFATE 1 % OP SOLN
2.0000 [drp] | OPHTHALMIC | Status: DC | PRN
  Administered 2015-01-21 – 2015-01-22 (×2): 2 [drp] via SUBLINGUAL
  Filled 2015-01-21: qty 2
  Filled 2015-01-21: qty 5

## 2015-01-21 MED ORDER — SODIUM CHLORIDE 0.9 % IJ SOLN: 10.0000 mL | INTRAMUSCULAR | Status: DC | PRN

## 2015-01-21 NOTE — Progress Notes (Signed)
Nutrition Brief Note  Chart reviewed. Pt now transitioning to comfort care.  No further nutrition interventions warranted at this time.  Please re-consult as needed.   Cornelious Bartolucci RD, LDN, CNSC [phone] Pager [phone] After Hours Pager    

## 2015-01-21 NOTE — Progress Notes (Signed)
Pharmacist Heart Failure Core Measure Documentation  Assessment: [name] has an EF documented as 20-25% on ECHO by 01/2014.  Rationale: Heart failure patients with left ventricular systolic dysfunction (LVSD) and an EF < 40% should be prescribed an angiotensin converting enzyme inhibitor (ACEI) or angiotensin receptor blocker (ARB) at discharge unless a contraindication is documented in the medical record.  This patient is not currently on an ACEI or ARB for HF.  This note is being placed in the record in order to provide documentation that a contraindication to the use of these agents is present for this encounter.  ACE Inhibitor or Angiotensin Receptor Blocker is contraindicated (specify all that apply)  []   ACEI allergy AND ARB allergy []   Angioedema []   Moderate or severe aortic stenosis []   Hyperkalemia []   Hypotension []   Renal artery stenosis [x]   Worsening renal function, preexisting renal disease or dysfunction   [name] 01/21/2015 11:06 AM

## 2015-01-21 NOTE — Progress Notes (Signed)
Advanced Heart Failure Rounding Note   Subjective:    Decompensated 01/19/15. Moved to CCU Placed on Bipap, Central line placed. Initial coox 20%, stat repeat 45%. Milrinone increased to 0.5 mcg/kg/min and norepi added.  Cardiac output remains poor and he developed marked ab pain and respiratory distress. Transitioned to comfort care. Now on versed and fentanly drips. Family asking if we could ltet him wake up a little to say good bye. No urine output. Sats 80-90s with cheyne stokes respirations.     Objective:   Weight Range:  Vital Signs:   Temp:  [97.8 F (36.6 C)] 97.8 F (36.6 C) (01/18 1159) Pulse Rate:  [25-147] 88 (01/19 0900) Resp:  [9-32] 9 (01/19 0900) BP: (77-128)/(62-99) 83/72 mmHg (01/19 0900) SpO2:  [70 %-100 %] 96 % (01/19 0900) FiO2 (%):  [40 %] 40 % (01/18 1200) Last BM Date: 01/14/15  Weight change: Filed Weights   01/18/15 0500 01/19/15 0500 01/20/15 0355  Weight: 233 lb 12.8 oz (106.051 kg) 232 lb 12.8 oz (105.597 kg) 226 lb (102.513 kg)    Intake/Output:   Intake/Output Summary (Last 24 hours) at 01/21/15 0920 Last data filed at 01/21/15 0900  Gross per 24 hour  Intake 1034.43 ml  Output    200 ml  Net 834.43 ml     Physical Exam: General:  In bed comatose Neck: supple. L neck central line. Cor: PMI laterally displaced. Irregularly irregular +s3 Lungs: cheyne-stokes Abdomen: soft,distended,  Extremities: no cyanosis, clubbing, rash, 1+ ankle edema. Neuro: nonresponsive  Telemetry: Reviewed personally, Afib 90s with occasional PVCs  Labs: Basic Metabolic Panel:  Recent Labs Lab 01/16/15 0004 01/16/15 0600 01/17/15 0441 01/17/15 1805 01/18/15 0415 01/19/15 0500 01/20/15 0405  NA  --  138 132*  --  135 134* 138  K  --  3.2* 4.2  --  4.9 4.0 4.0  CL  --  104 104  --  105 99* 99*  CO2  --  27 21*  --  23 25 28   GLUCOSE  --  104* 130*  --  108* 151* 129*  BUN  --  16 24*  --  30* 34* 30*  CREATININE  --  1.77* 2.39*  --  2.93*  2.98* 2.72*  CALCIUM  --  8.7* 8.9  --  9.0 9.3 9.4  MG 1.5*  --   --  2.1  --   --  1.9  PHOS 2.8  --   --   --   --   --   --     Liver Function Tests:  Recent Labs Lab 01/09/2015 1916 01/16/15 0600 01/19/15 0500  AST 31 25 70*  ALT 22 19 34  ALKPHOS 65 52 48  BILITOT 1.4* 1.5* 1.2  PROT 7.2 6.3* 6.6  ALBUMIN 2.8* 2.3* 2.4*    Recent Labs Lab 01/18/2015 1916  LIPASE 27    Recent Labs Lab 01/18/15 1041 01/20/15 1050  AMMONIA 40* 24    CBC:  Recent Labs Lab 01/16/15 0600 01/17/15 0441 01/18/15 0415 01/19/15 0500 01/20/15 0405  WBC 7.1 5.1 5.4 6.0 6.6  NEUTROABS 4.8 2.8 3.3 4.0 4.2  HGB 10.0* 9.9* 9.8* 9.7* 10.3*  HCT 30.5* 30.5* 29.6* 29.0* 31.5*  MCV 89.4 88.7 87.8 88.4 89.7  PLT 232 221 241 240 280    Cardiac Enzymes:  Recent Labs Lab 01/16/15 0004 01/16/15 0600 01/16/15 1700  TROPONINI 0.13* 0.12* 0.11*    BNP: BNP (last 3 results)  Recent Labs  07/27/14 1928 09/11/14 1052 01/20/2015 1916  BNP 407.5* 262.5* 700.0*    ProBNP (last 3 results) No results for input(s): PROBNP in the last 8760 hours.    Other results:  Imaging: No results found.   Medications:     Scheduled Medications:    Infusions: . sodium chloride    . sodium chloride    . DOBUTamine 7.5 mcg/kg/min (01/20/15 0900)  . fentaNYL infusion INTRAVENOUS 200 mcg/hr (01/21/15 0800)  . furosemide (LASIX) infusion 15 mg/hr (01/20/15 1004)  . midazolam (VERSED) infusion 5 mg/hr (01/21/15 [id number])  . norepinephrine (LEVOPHED) Adult infusion 20 mcg/min (01/20/15 1310)    PRN Medications: sodium chloride, sodium chloride, ondansetron (ZOFRAN) IV, ondansetron   Assessment:   1. Acute on chronic systolic HF on home milrinone. NICM EF 20-25% -> cardiogenic shock      -s/p St Jude ICD 2. Acute hypoxic respiratory failure 3. PAF 4. Acute on chronic renal failure stage IV 5. PICC line infection 6. CP - due to PICC line site infection 7. Hypokalemia 8. Anemia  9.  H/o VT/VF 10. DNR/DNI  Plan/Discussion:   Due to persistent cardiogenic shock and respiratory failure transitioned to full comfort care. Continue fentanyl and versed drip.   DNR/DNI  Discussed condition with his fiance.   Possible transfer to 6N   Length of Stay: [place] PA-C 01/21/2015, 9:20 AM  Advanced Heart Failure Team Pager [phone] (M-F; 7a - 4p)  Please contact [facility] for night-coverage after hours (4p -7a ) and weekends on [email or web address]  Patient seen and examined with Darrick Grinder, NP. We discussed all aspects of the encounter. I agree with the assessment and plan as stated above.   He is actively dying from end-stage HF refractory to multiple inotropes. He has multi-system organ failure with worsening renal and respiratory failure.  Now on comfort care. Family asking if we can lighten sedation briefly to say goodbye. Long talk about the risks of this as far as pain and resp distress. Will turn versed off for one hour and see if he is able to communicate with them but I was clear to them that we will not let him suffer. I expect he will pass in the next 24-48 hours.   Jaythan Hinely,MD 11:32 AM

## 2015-01-22 ENCOUNTER — Other Ambulatory Visit (HOSPITAL_COMMUNITY): Payer: Self-pay | Admitting: Internal Medicine

## 2015-01-22 ENCOUNTER — Ambulatory Visit (HOSPITAL_COMMUNITY): Payer: Medicare Other

## 2015-01-22 MED ORDER — ACETAMINOPHEN 650 MG RE SUPP: 650.0000 mg | Freq: Once | RECTAL | Status: DC

## 2015-01-24 IMAGING — US US ABDOMEN COMPLETE
1 series · 14 of 25 positions shown · non-contrast
Comparison: Ultrasound 11/19/2012 and CT 01/19/2012

CLINICAL DATA: Epigastric pain 2 weeks.

EXAM:
ULTRASOUND ABDOMEN COMPLETE

[Series 1: us abdomen complete · 0.41mm/px · 14 of 88 slices shown]
[im 1/88]
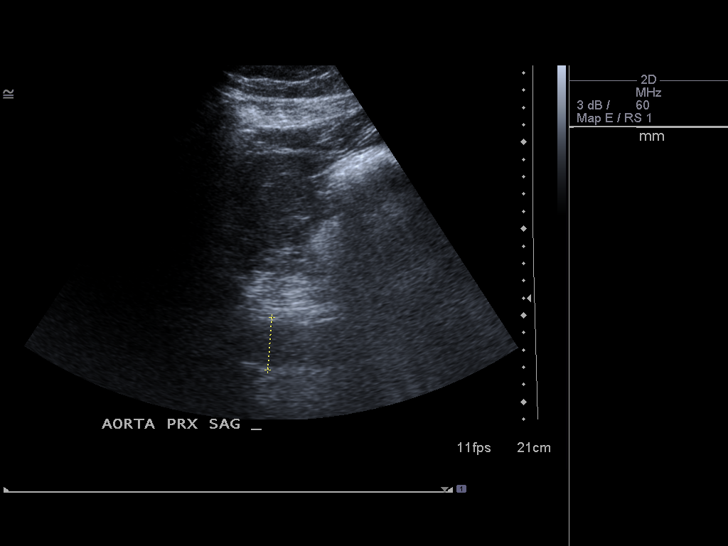
[im 8/88]
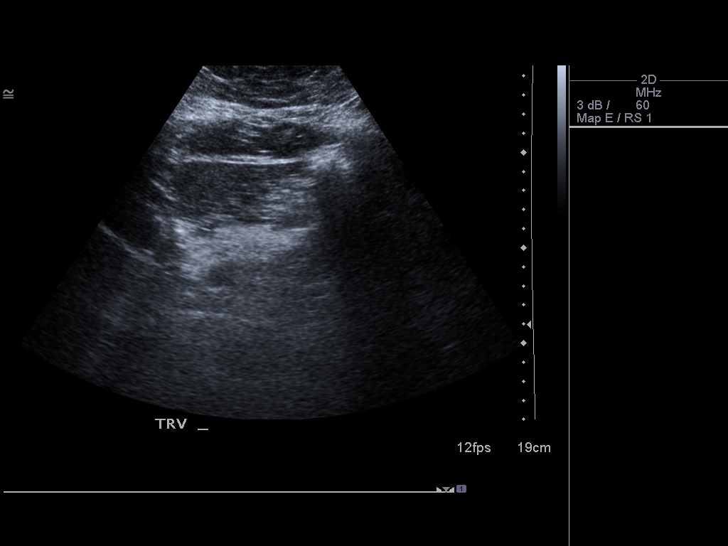
[im 15/88]
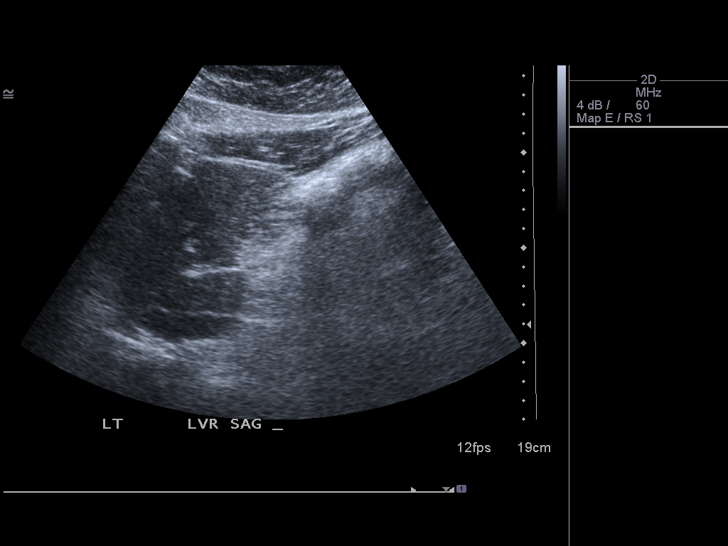
[im 22/88]
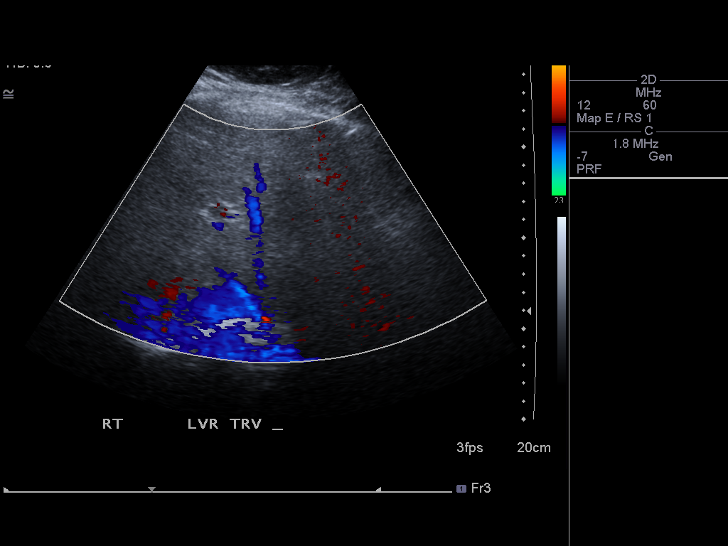
[im 30/88]
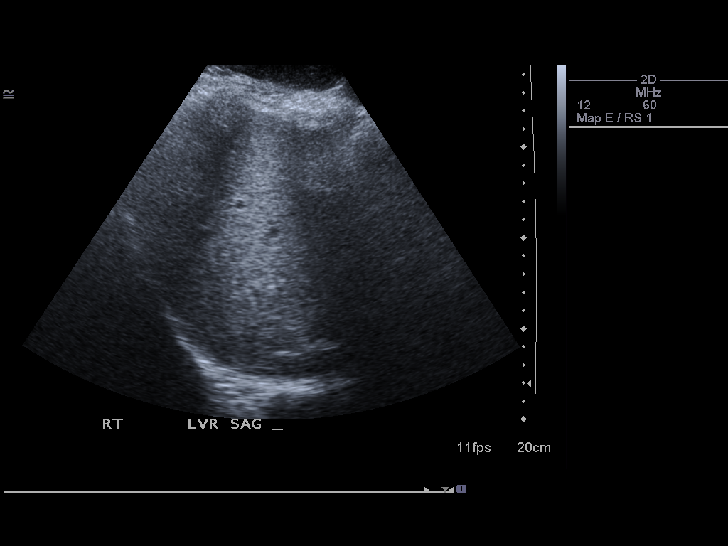
[im 33/88]
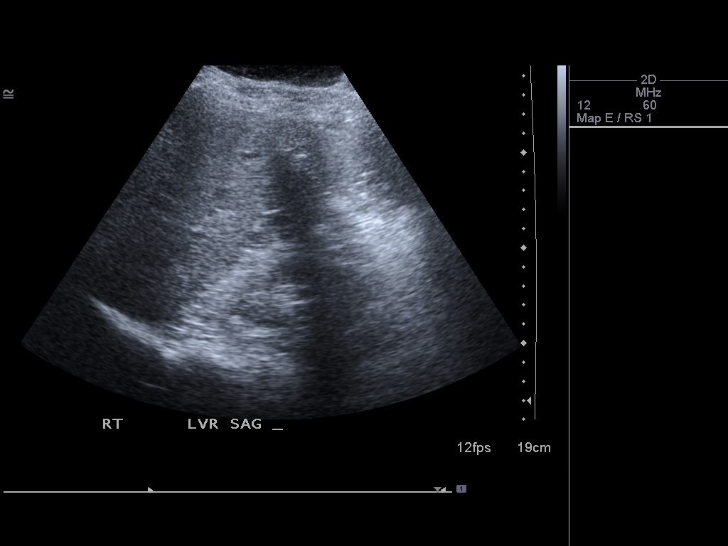
[im 40/88]
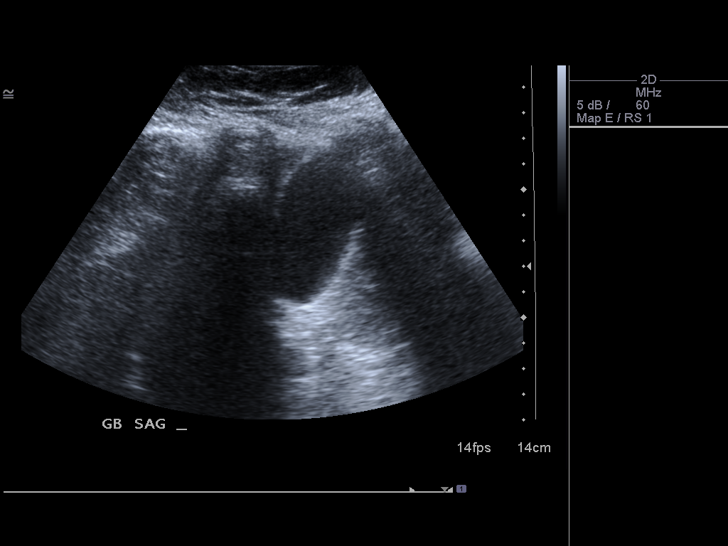
[im 48/88]
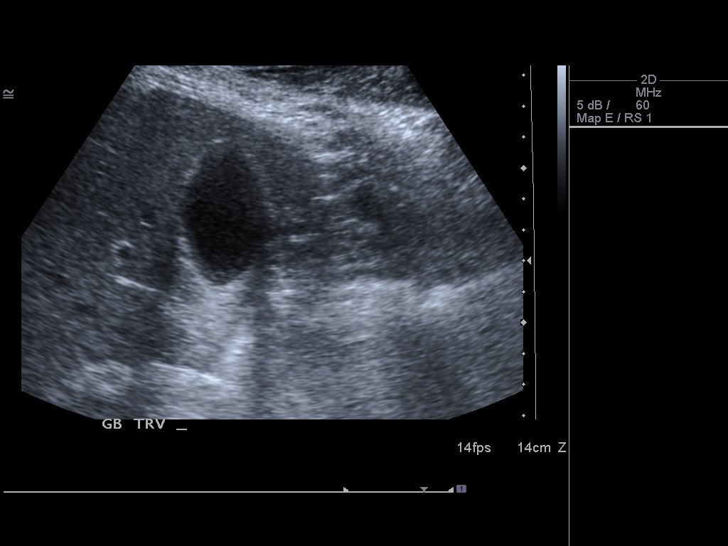
[im 55/88]
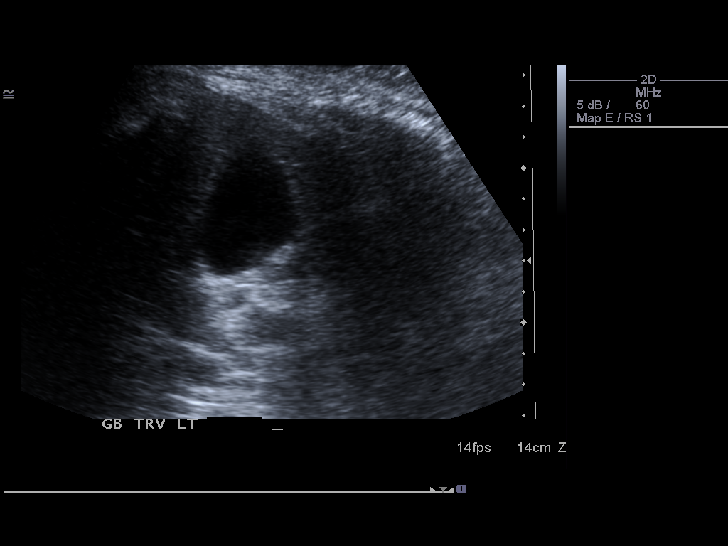
[im 59/88]
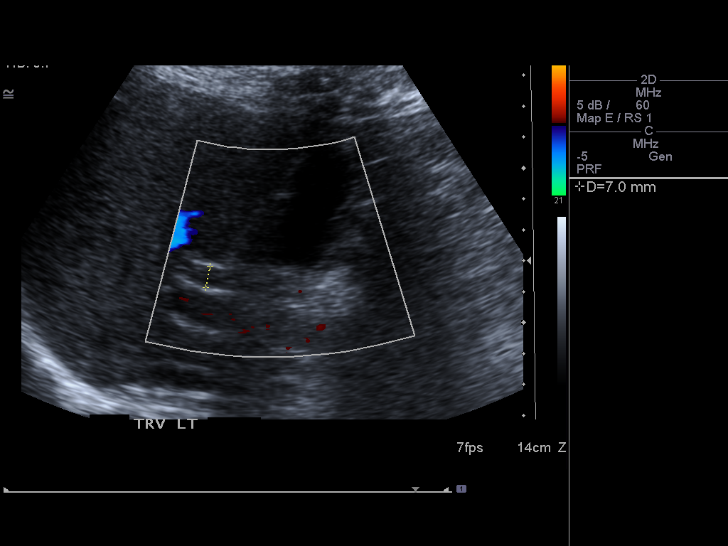
[im 66/88]
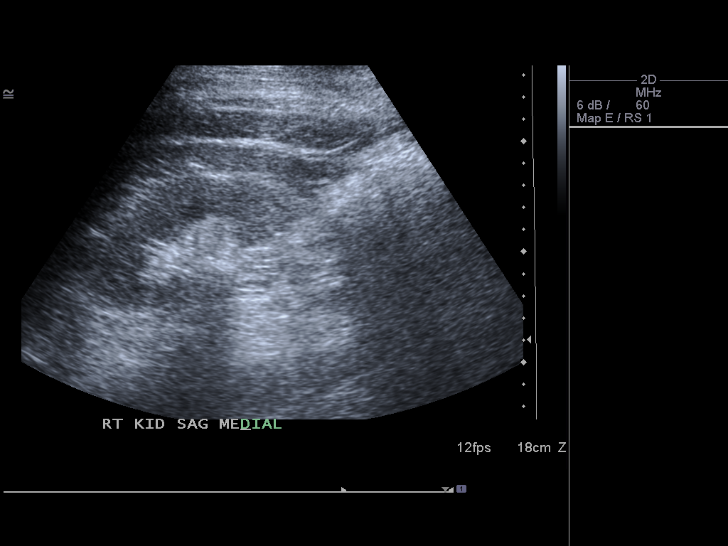
[im 73/88]
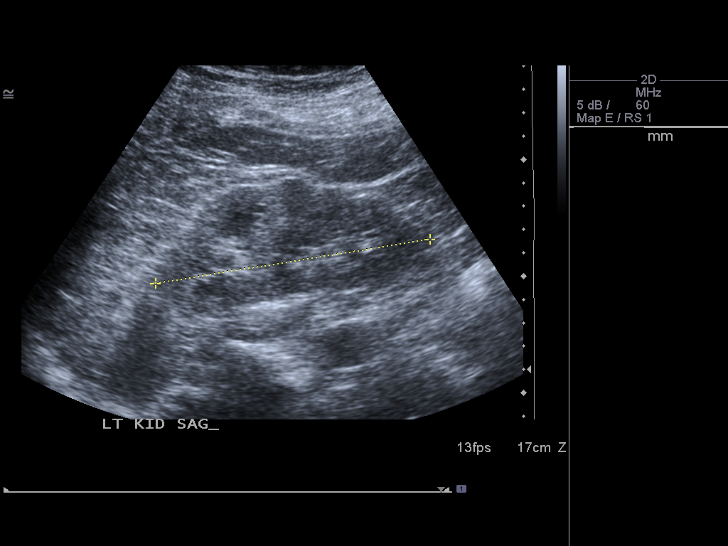
[im 80/88]
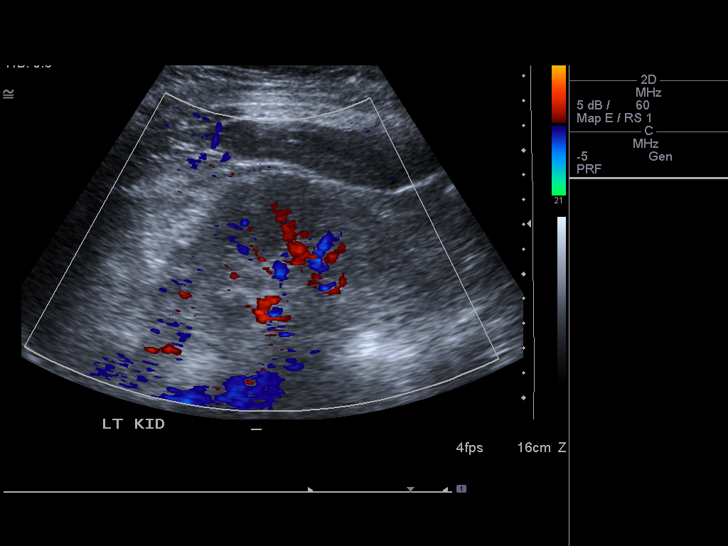
[im 88/88]
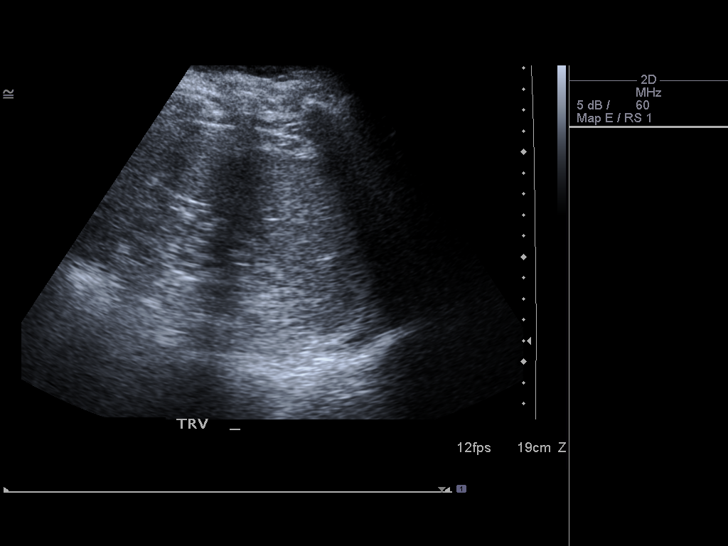

[14 of 25 positions shown; findings below may reference images not displayed]

FINDINGS: Gallbladder:

Mild sludge without evidence of cholelithiasis. Negative sonographic
Murphy sign. No adjacent free fluid. No wall thickening.

Common bile duct:

Diameter: 7 mm.

Liver:

On increased echogenicity compatible with steatosis without focal
mass.

IVC:

No abnormality visualized.

Pancreas:

Visualized portion unremarkable.

Spleen:

Size and appearance within normal limits.

Right Kidney:

Length: 10.5 cm. Echogenicity within normal limits. No mass or
hydronephrosis visualized.

Left Kidney:

Length: 11.9 cm. Echogenicity within normal limits. No mass or
hydronephrosis visualized. Sub cm cyst over the lower pole.

Abdominal aorta:

No aneurysm visualized.

Other findings:

None.
IMPRESSION: Suggestion mild gallbladder sludge. No acute hepatobiliary findings.

Mild hepatic steatosis without focal mass.

Sub cm left renal cyst.

## 2015-02-02 ENCOUNTER — Encounter (HOSPITAL_COMMUNITY): Payer: Medicare Other

## 2015-02-03 NOTE — Progress Notes (Signed)
Wasted Versed 56ml and Fentanyl 215ml in sink witnessed by Esaw Dace. 1 bag of Fentanyl drip not used returned to pharmacy.

## 2015-02-03 NOTE — Progress Notes (Signed)
Advanced Heart Failure Rounding Note   Subjective:    Decompensated 01/19/15. Moved to CCU Placed on Bipap, Central line placed. Initial coox 20%, stat repeat 45%. Milrinone increased to 0.5 mcg/kg/min and norepi added.  Off versed and fentanly drips for 1 hour but he did not wake up. Will restart now. Gurgling at times.   .   Objective:   Weight Range:  Vital Signs:   Pulse Rate:  [86-126] 114 (01/20 0600) Resp:  [11-24] 22 (01/20 0600) BP: (90-103)/(70-79) 94/77 mmHg (01/20 0600) SpO2:  [86 %-98 %] 87 % (01/20 0600) Last BM Date: 01/14/15  Weight change: Filed Weights   01/18/15 0500 01/19/15 0500 01/20/15 0355  Weight: 233 lb 12.8 oz (106.051 kg) 232 lb 12.8 oz (105.597 kg) 226 lb (102.513 kg)    Intake/Output:   Intake/Output Summary (Last 24 hours) at 2015/02/06 0908 Last data filed at 2015/02/06 0600  Gross per 24 hour  Intake    525 ml  Output      0 ml  Net    525 ml     Physical Exam: General:  In bed comatose. [place] Neck: supple. L neck central line. Cor: PMI laterally displaced. Irregularly irregular +s3 Lungs: cheyne-stokes Abdomen: soft,distended,  Extremities: no cyanosis, clubbing, rash, 1+ ankle edema. Neuro: nonresponsive  Telemetry: Reviewed personally, Afib 90s with occasional PVCs  Labs: Basic Metabolic Panel:  Recent Labs Lab 01/16/15 0004 01/16/15 0600 01/17/15 0441 01/17/15 1805 01/18/15 0415 01/19/15 0500 01/20/15 0405  NA  --  138 132*  --  135 134* 138  K  --  3.2* 4.2  --  4.9 4.0 4.0  CL  --  104 104  --  105 99* 99*  CO2  --  27 21*  --  23 25 28   GLUCOSE  --  104* 130*  --  108* 151* 129*  BUN  --  16 24*  --  30* 34* 30*  CREATININE  --  1.77* 2.39*  --  2.93* 2.98* 2.72*  CALCIUM  --  8.7* 8.9  --  9.0 9.3 9.4  MG 1.5*  --   --  2.1  --   --  1.9  PHOS 2.8  --   --   --   --   --   --     Liver Function Tests:  Recent Labs Lab 01/14/2015 1916 01/16/15 0600 01/19/15 0500  AST 31 25 70*  ALT 22 19 34    ALKPHOS 65 52 48  BILITOT 1.4* 1.5* 1.2  PROT 7.2 6.3* 6.6  ALBUMIN 2.8* 2.3* 2.4*    Recent Labs Lab 01/19/2015 1916  LIPASE 27    Recent Labs Lab 01/18/15 1041 01/20/15 1050  AMMONIA 40* 24    CBC:  Recent Labs Lab 01/16/15 0600 01/17/15 0441 01/18/15 0415 01/19/15 0500 01/20/15 0405  WBC 7.1 5.1 5.4 6.0 6.6  NEUTROABS 4.8 2.8 3.3 4.0 4.2  HGB 10.0* 9.9* 9.8* 9.7* 10.3*  HCT 30.5* 30.5* 29.6* 29.0* 31.5*  MCV 89.4 88.7 87.8 88.4 89.7  PLT 232 221 241 240 280    Cardiac Enzymes:  Recent Labs Lab 01/16/15 0004 01/16/15 0600 01/16/15 1700  TROPONINI 0.13* 0.12* 0.11*    BNP: BNP (last 3 results)  Recent Labs  07/27/14 1928 09/11/14 1052 01/13/2015 1916  BNP 407.5* 262.5* 700.0*    ProBNP (last 3 results) No results for input(s): PROBNP in the last 8760 hours.    Other results:  Imaging: No  results found.   Medications:     Scheduled Medications: . sodium chloride  10-40 mL Intracatheter Q12H    Infusions: . sodium chloride    . sodium chloride    . DOBUTamine 7.5 mcg/kg/min (01/20/15 0900)  . fentaNYL infusion INTRAVENOUS 200 mcg/hr (January 29, 2015 0538)  . midazolam (VERSED) infusion 5 mg/hr (2015/01/29 0539)    PRN Medications: sodium chloride, sodium chloride, atropine, ondansetron (ZOFRAN) IV, ondansetron, sodium chloride   Assessment:   1. Acute on chronic systolic HF on home milrinone. NICM EF 20-25% -> cardiogenic shock      -s/p St Jude ICD 2. Acute hypoxic respiratory failure 3. PAF 4. Acute on chronic renal failure stage IV 5. PICC line infection 6. CP - due to PICC line site infection 7. Hypokalemia 8. Anemia  9. H/o VT/VF 10. DNR/DNI  Plan/Discussion:   Due to persistent cardiogenic shock and respiratory failure transitioned to full comfort care. Continue fentanyl and versed drip.   DNR/DNI  Discussed condition with his fiance.   Transfer to  6N   Length of Stay: 7  Amy Clegg NP-C  Jan 29, 2015, 9:08  AM  Advanced Heart Failure Team Pager [phone] (M-F; 7a - 4p)  Please contact [facility] for night-coverage after hours (4p -7a ) and weekends on [email or web address]  Patient seen and examined with Darrick Grinder, NP. We discussed all aspects of the encounter. I agree with the assessment and plan as stated above.   Continue comfort care. Will increase Fentanyl to 300. Can move 6N. Discussed with family at bedside.   Bensimhon, Daniel,MD 9:26 AM

## 2015-02-03 NOTE — Discharge Summary (Addendum)
Advanced Heart Failure Team  Discharge Summary   Patient ID: [name] MRN: [id number], DOB/AGE: 1950/04/08 65 y.o. Admit date: 01/04/2015 D/C date:     February 03, 2015    Primary Discharge Diagnoses:  1. Acute on chronic systolic HF on home milrinone. NICM EF 20-25% -> cardiogenic shock  -s/p St Jude ICD 2. Acute hypoxic respiratory failure 3. PAF 4. Acute on chronic renal failure stage IV 5. PICC line infection with associated bacteremia and sepsis 6. Hypokalemia 7. Anemia  8. H/o VT/VF 9. Abdominal pain due to acute mesenteric ischemia from low output HF   Hospital Course:  [name] was a 65 y.o. 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 He also had a history of ventricular tachycardia s/p St Jude ICD, CKD stage IV, and paroxysmal atrial arrhythmias on amiodarone as well as sarcoidosis and hypertension. Cath in 2008 by Dr. Terrence Dupont showed no CAD. Seen at [facility] and not felt to be a transplant candidate due to renal failure and lack of family support.He was on chronic home milrinone at 0.375 mcg.  Admitted with PICC line infection. Hospital course was complicated by heart failure, sepsis, and abdominal pain.  Prior to admit he was on chronic home milrinone. Blood cultures obtained and he was started on vancomycin and zosyn. Culutres showed 1/3 CN staph and 1/2 bacillus species. PICC line was removed. Creatinine on admit was 1.77 but steadily climbed from 1.77 to 2.9. GI consulted for abdominal pain despite max dose PPI and H2 blocker but due to instability EGD was not an option. On hospital day 2 he developed increased dyspnea and fatigue. CCM consulted and central line placed. Mixed venous saturation was 20% consistent with profound cardiogenic shock despite milrinone at 0.375 mcg .Due to increased dyspnea he was transferred to ICU and placed on BiPap,  milrinone was increased, and norepi was added.  Mixed venous saturation remained extremely low, 42% so he was switched to 5 mcg of dobutamine. He had limited diuresis with high dose IV lasix and CVP continued to rise.  He continued to decline with increase abdominal pain and dyspnea. Mixed venous remained low despite 2 inotropes. He declined further with mixed venous saturation down to 29%.   At this point all options were exhausted and he requested comfort care and DNR. He was placed on versed and fentanyl drip due to abdominal pain and dyspnea. He passed quietly with family at the bedside on 2015-02-03.   Duration of Discharge Encounter: Greater than 35 minutes   Signed, Amy Clegg NP-C  01/29/2015, 3:21 PM  Patient seen and examined with Darrick Grinder, NP. We discussed all aspects of the encounter. I agree with the summary as stated above.   Jaleea Alesi,MD 10:07 PM

## 2015-02-03 DEATH — deceased

## 2015-04-11 IMAGING — XA IR FLUORO GUIDE CV LINE*R*
1 series · 1 of 1 positions shown · non-contrast
Comparison: none

CLINICAL DATA: Occluded tunneled right internal jugular vein
central venous catheter which is used for continue IV milrinone
therapy. The patient requires exchange of the catheter.

[Series 300: ir fluoro guide cv line*r*&&ir fluoro gu · 1 of 1 slices shown]
[im 1/1]
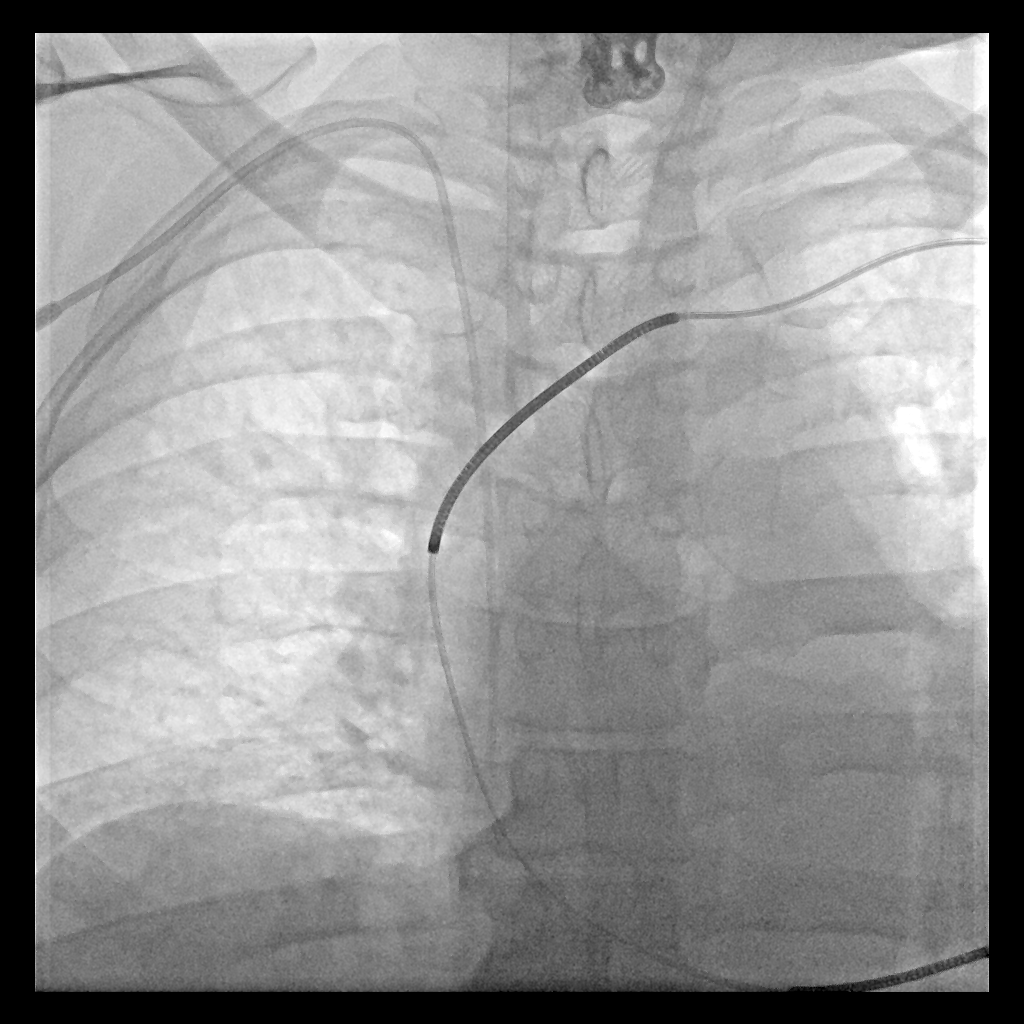

[1 of 1 positions shown; findings below may reference images not displayed]

EXAM:
EXCHANGE OF TUNNELED CENTRAL VENOUS CATHETER UNDER FLUOROSCOPIC
GUIDANCE

ANESTHESIA/SEDATION:
No conscious sedation was administered.

MEDICATIONS:
None

FLUOROSCOPY TIME:  12 seconds.

PROCEDURE:
The procedure, risks, benefits, and alternatives were explained to
the patient. Questions regarding the procedure were encouraged and
answered. The patient understands and consents to the procedure.

The preexisting right chest central venous catheter and surrounding
skin were prepped with chlorhexidine in a sterile fashion, and a
sterile drape was applied covering the operative field. Maximum
barrier sterile technique with sterile gowns and gloves were used
for the procedure. A time-out procedure was performed. Local
anesthesia was provided with 1% lidocaine.

The preexisting dialysis catheter was removed over a guidewire after
freeing the subcutaneous cuff using blunt dissection.

A new 6 French, dual-lumen tunneled power line catheter measuring 23
cm from tip to cuff was chosen for placement. This was advanced over
the guidewire under fluoroscopy. Final catheter positioning was
confirmed and documented with a fluoroscopic spot image. The
catheter was aspirated and flushed with saline.

COMPLICATIONS:
None.
FINDINGS: After catheter placement, the tip lies at the cavoatrial junction.
The catheter aspirates normally and is ready for immediate use.
IMPRESSION: Exchange of tunneled central venous catheter via the right internal
jugular vein. The catheter tip lies at the cavoatrial junction. The
catheter is ready for immediate use.

## 2015-05-11 IMAGING — DX DG KNEE COMPLETE 4+V*R*
4 series · 4 of 4 positions shown · non-contrast
Comparison: None.

CLINICAL DATA: One day history of pain

EXAM:
RIGHT KNEE - COMPLETE 4+ VIEW

[knee ap]
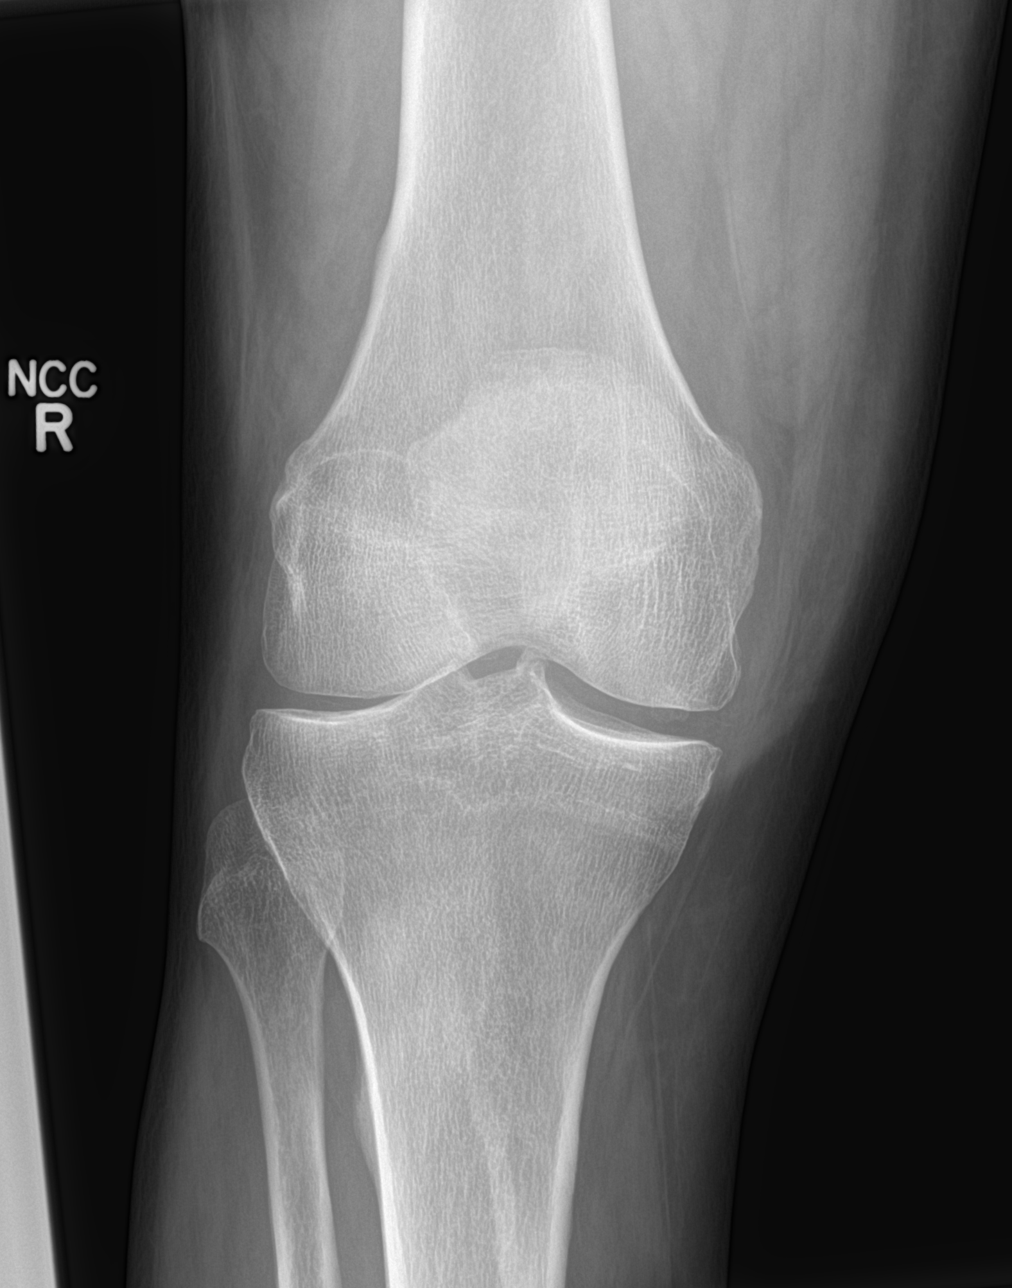

[knee obl (1 of 2)]
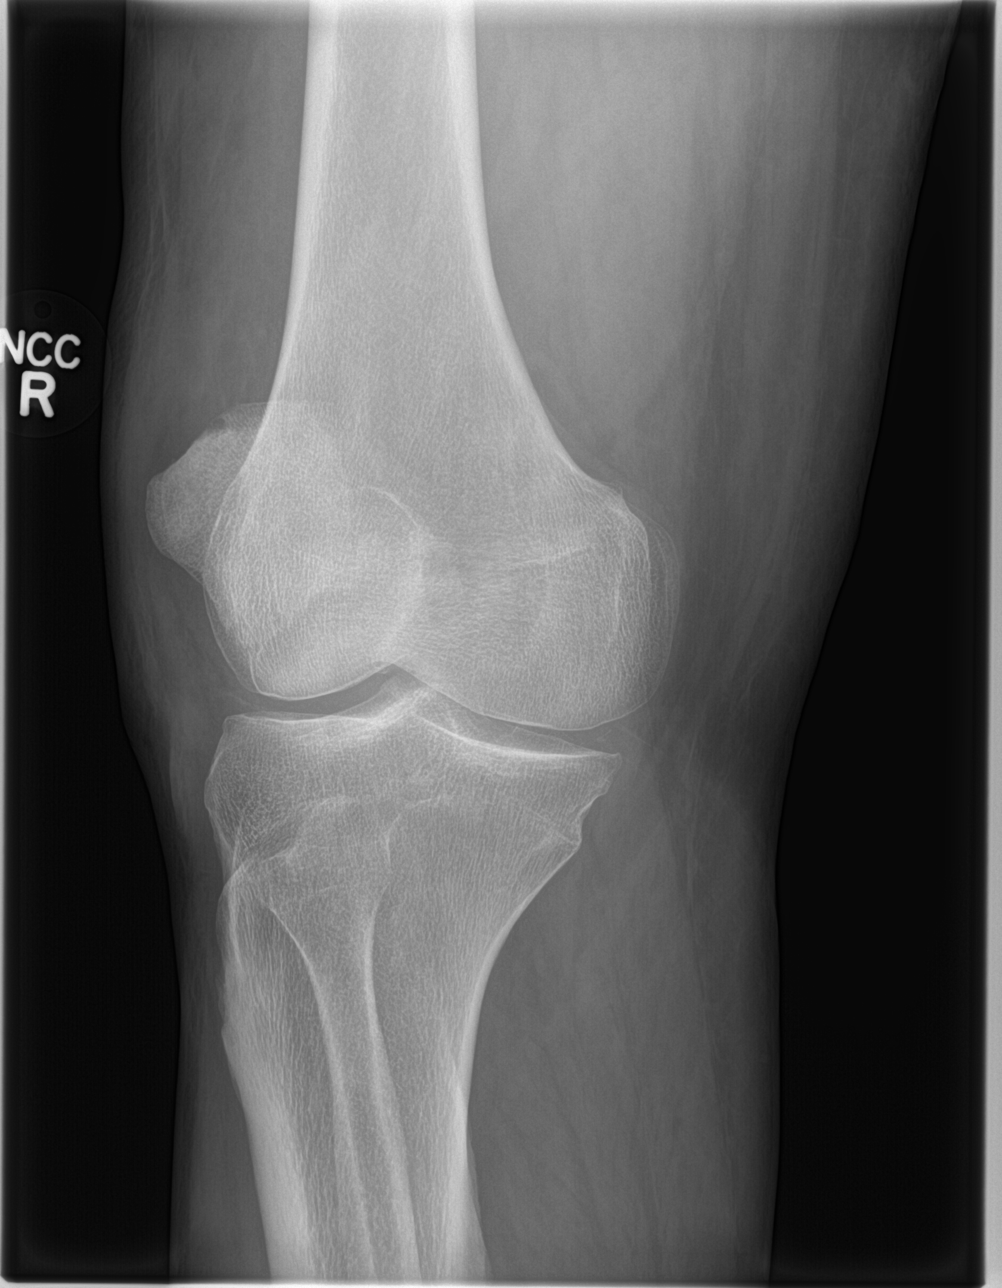

[knee obl (2 of 2)]
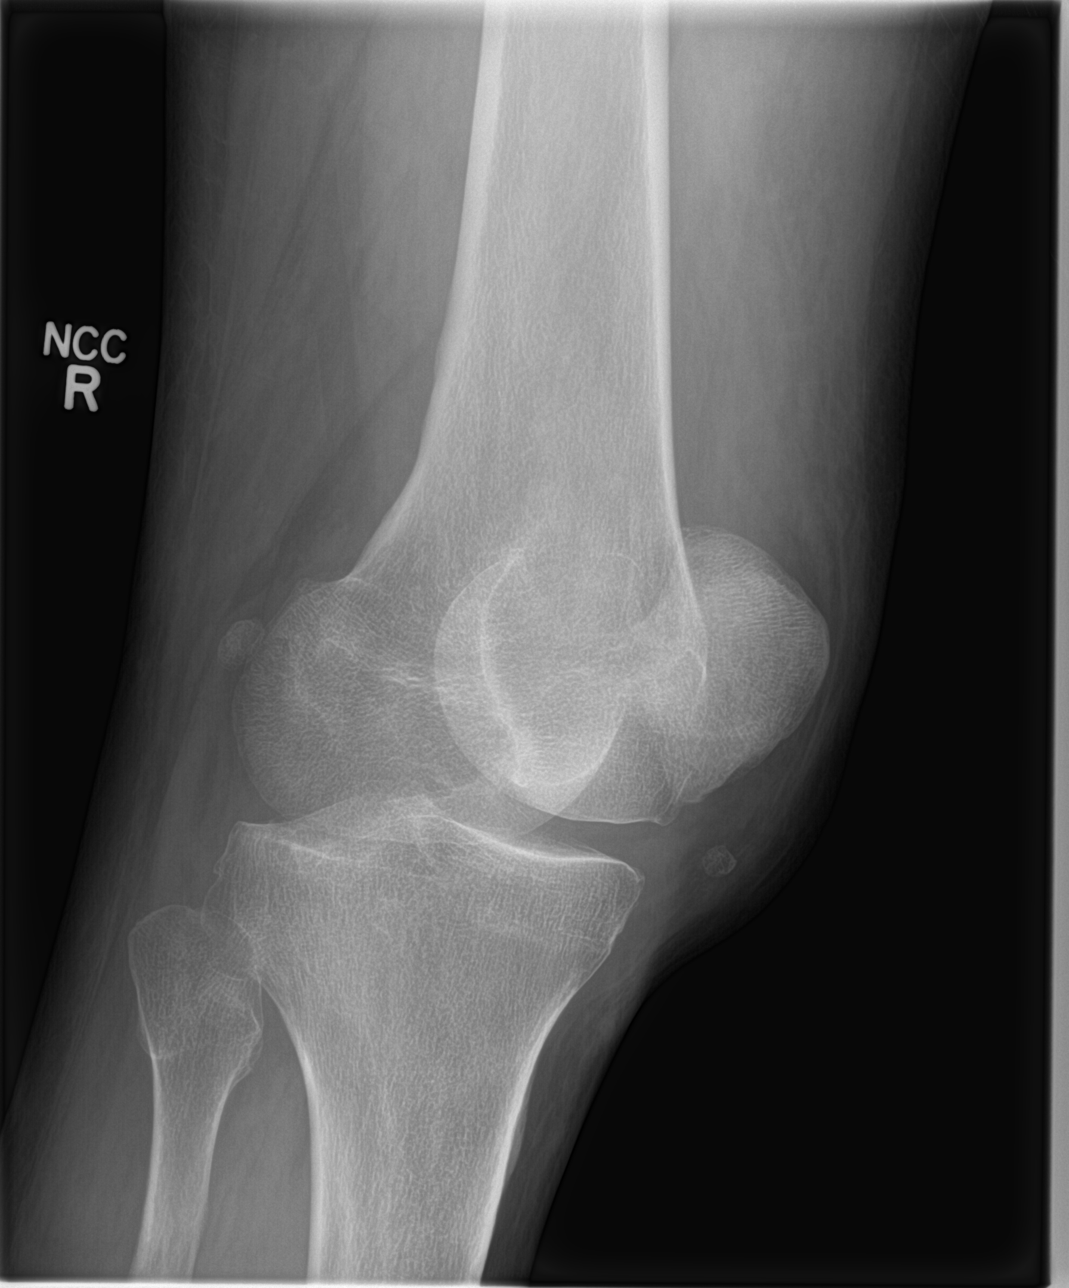

[knee lat]
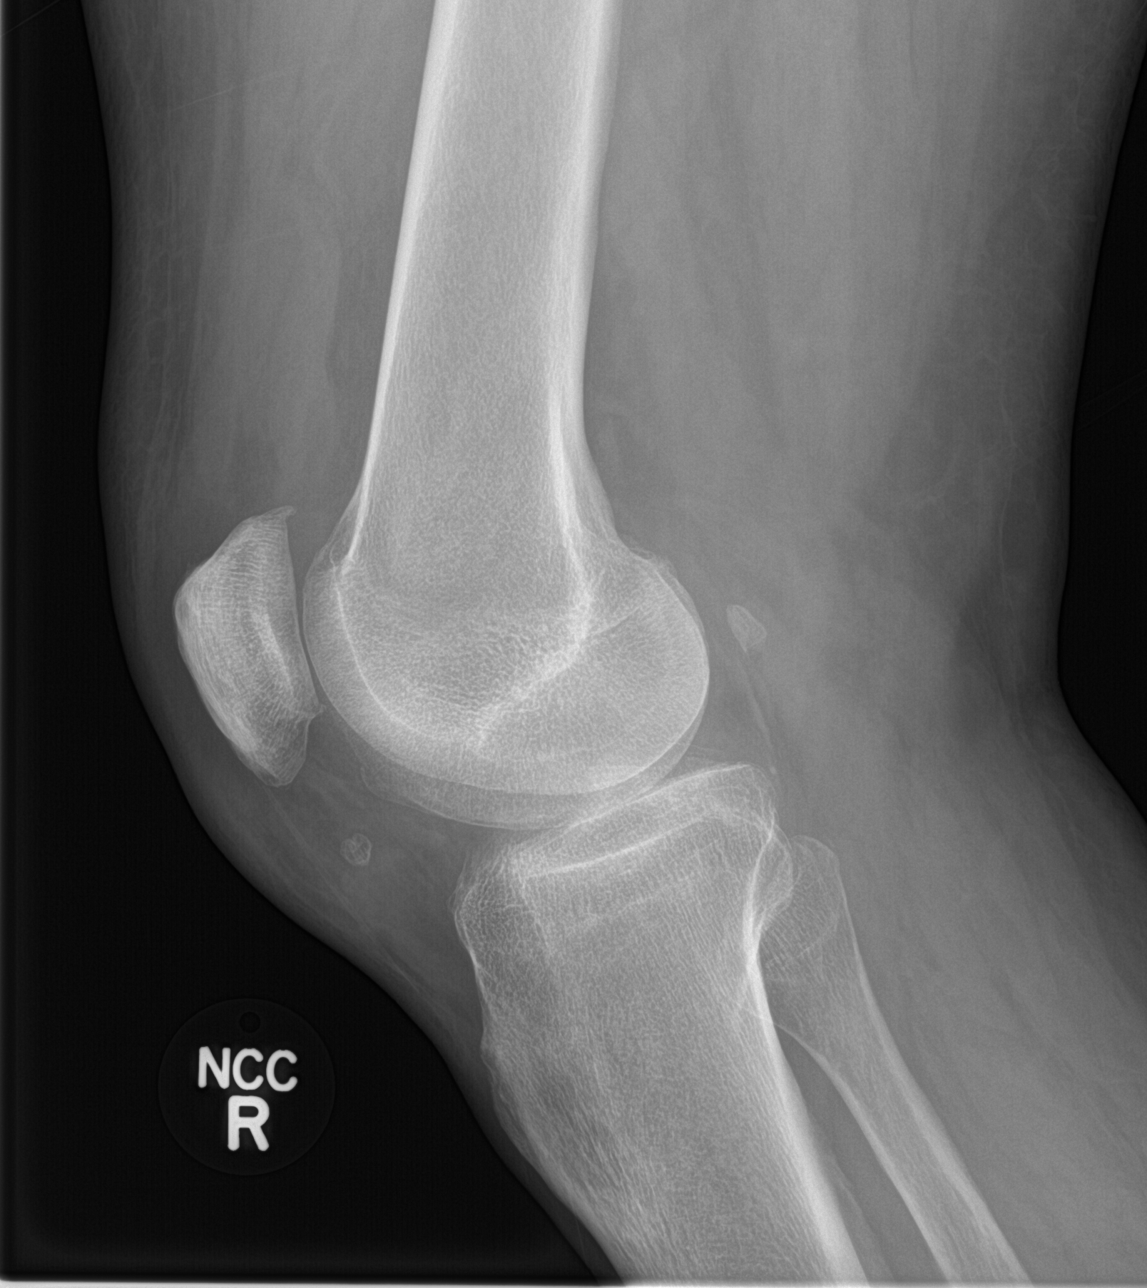

[4 of 4 positions shown; findings below may reference images not displayed]

FINDINGS: Frontal, lateral, and bilateral oblique views were obtained. There
is no fracture or dislocation. There is a joint effusion. There is
mild narrowing of the patellofemoral joint. There is spurring along
the posterior aspect of the patella. There are scattered foci of
intrameniscal calcification. There is a small loose body in Hoffa's
fat pad.
IMPRESSION: Joint effusion present. No fracture or dislocation. Narrowing of the
patellofemoral joint with areas of meniscal calcification. These are
findings that may be seen with osteoarthritis. These are also
findings that may be seen with calcium pyrophosphate deposition
disease which may present clinically as pseudogout.

## 2015-05-23 IMAGING — CR DG CHEST 1V PORT
1 series · 1 of 1 positions shown · non-contrast
Comparison: 08/06/2013

CLINICAL DATA: Mid and left-sided chest pain. Pain down the left
arm. Shortness of breath.

EXAM:
PORTABLE CHEST - 1 VIEW

[AP]
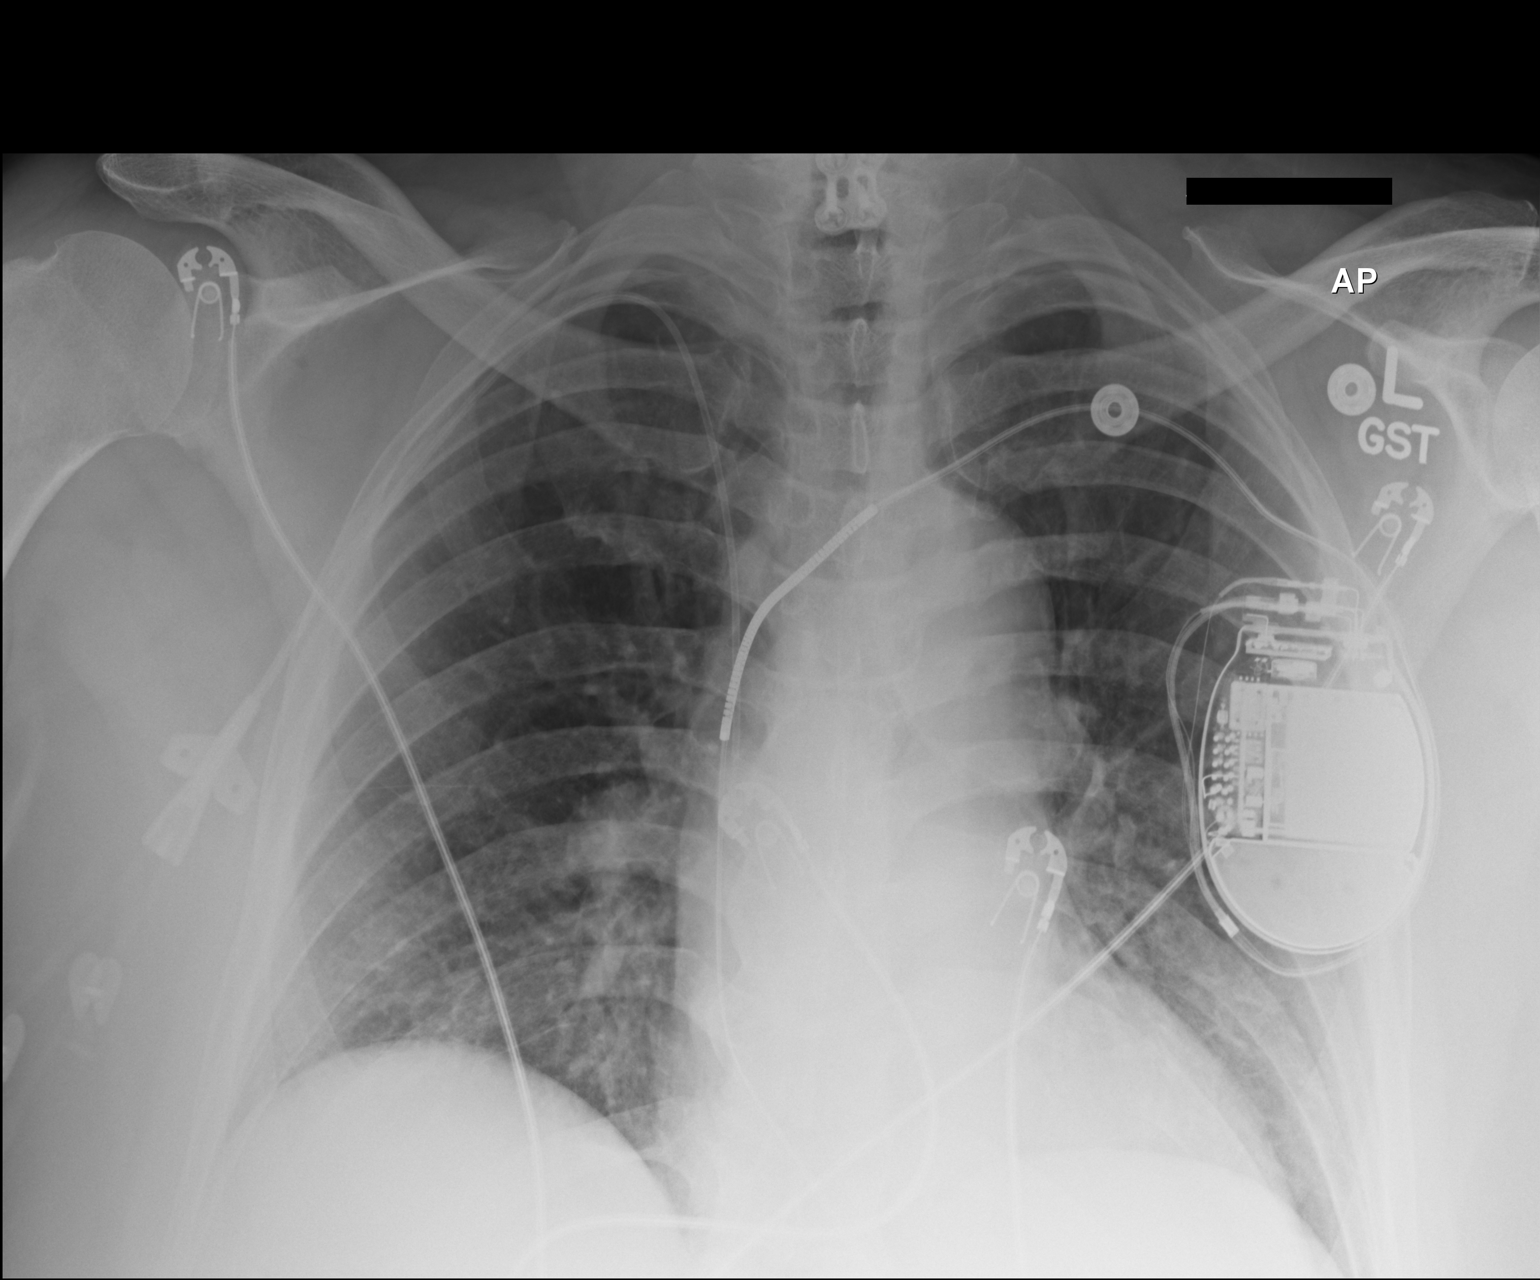

[1 of 1 positions shown; findings below may reference images not displayed]

FINDINGS: Cardiac pacemaker. Right central venous catheter with tip over the
low SVC region. No pneumothorax. Shallow inspiration. Heart size and
pulmonary vascularity are normal. Lungs appear clear and expanded.
Postoperative changes in the cervical spine.
IMPRESSION: No active disease.

## 2015-05-24 IMAGING — DX DG CHEST 2V
2 series · 2 of 2 positions shown · non-contrast
Comparison: Chest x-rays dated 12/09/2013 and 08/06/2013

CLINICAL DATA: Cough and shortness of breath.

EXAM:
CHEST  2 VIEW

[chest pa]
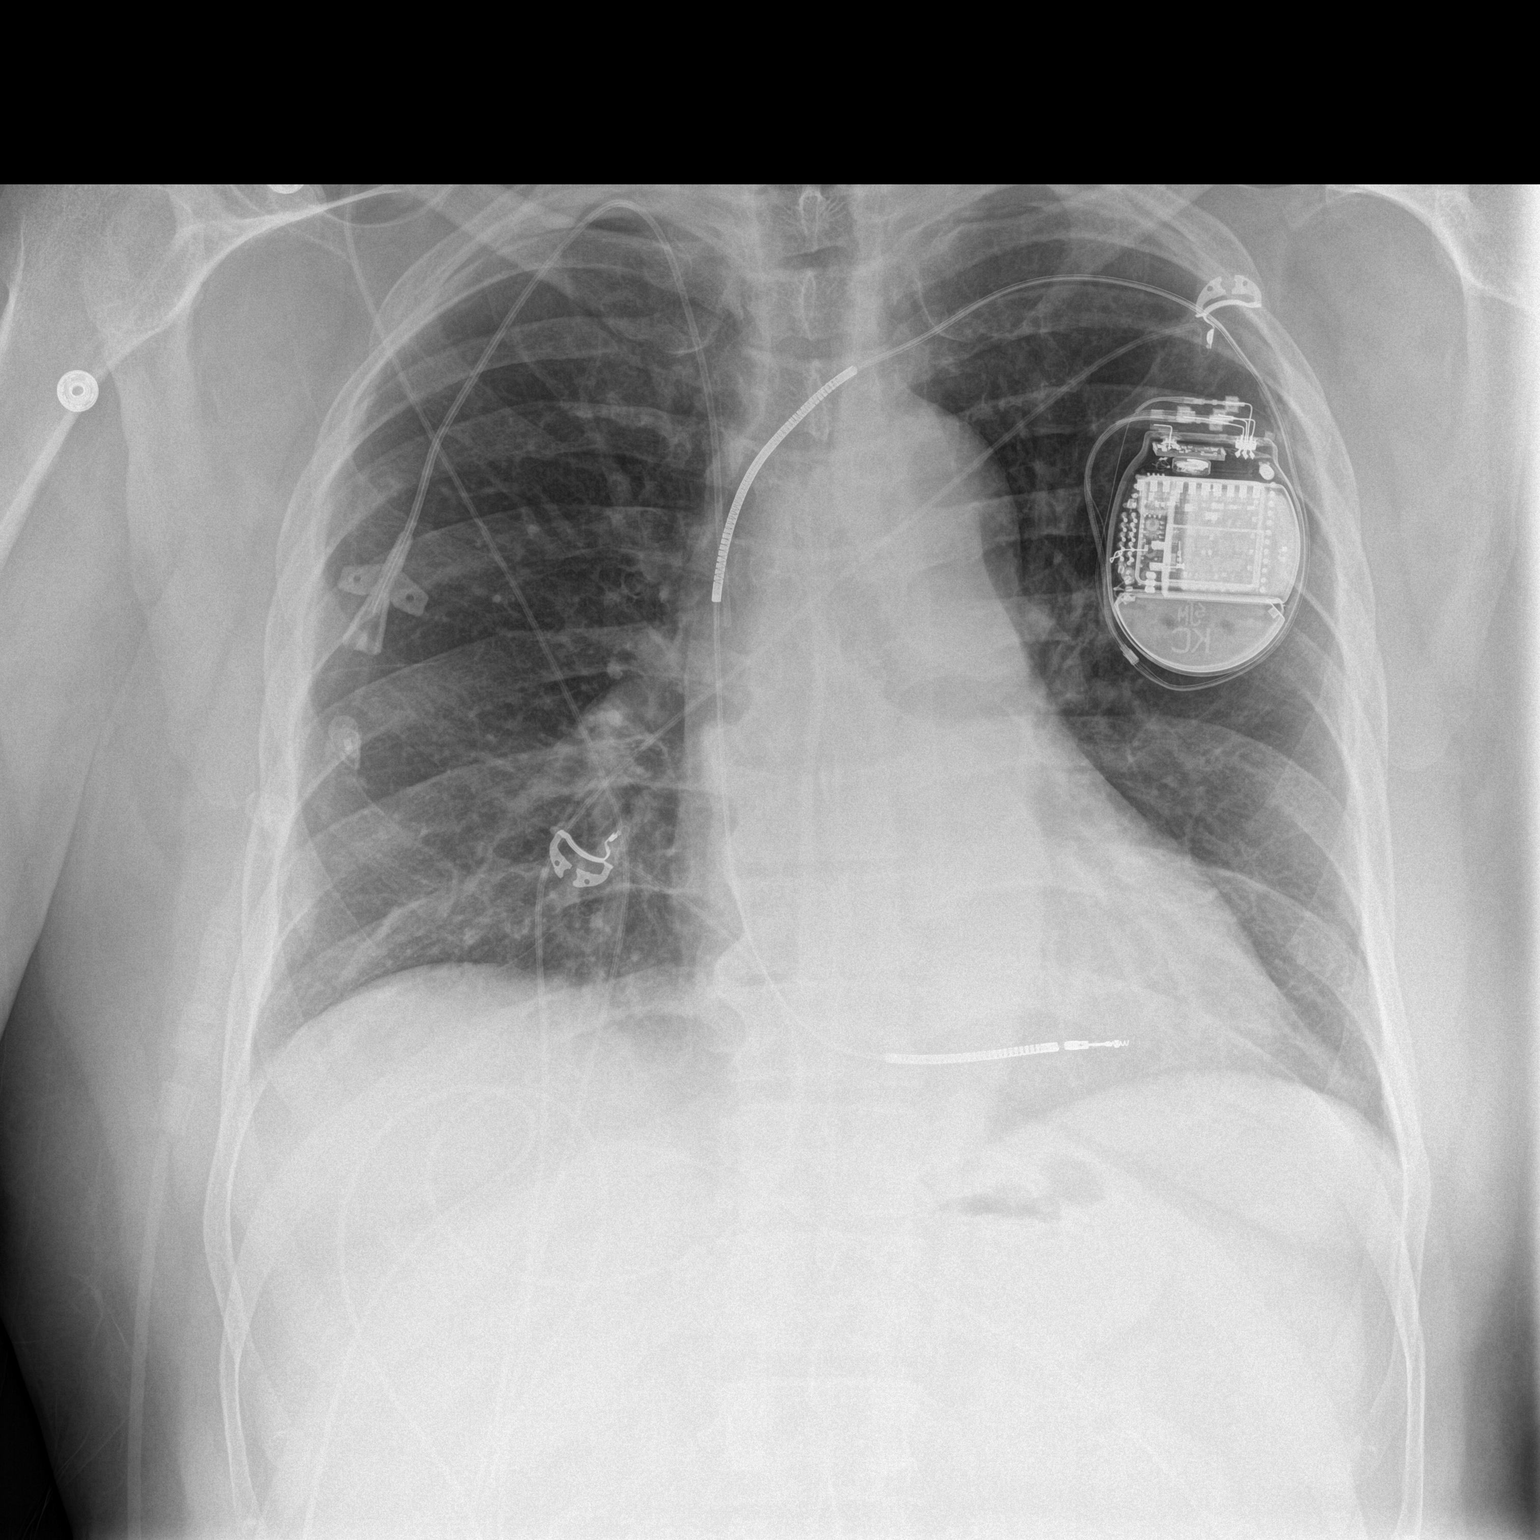

[chest lat]
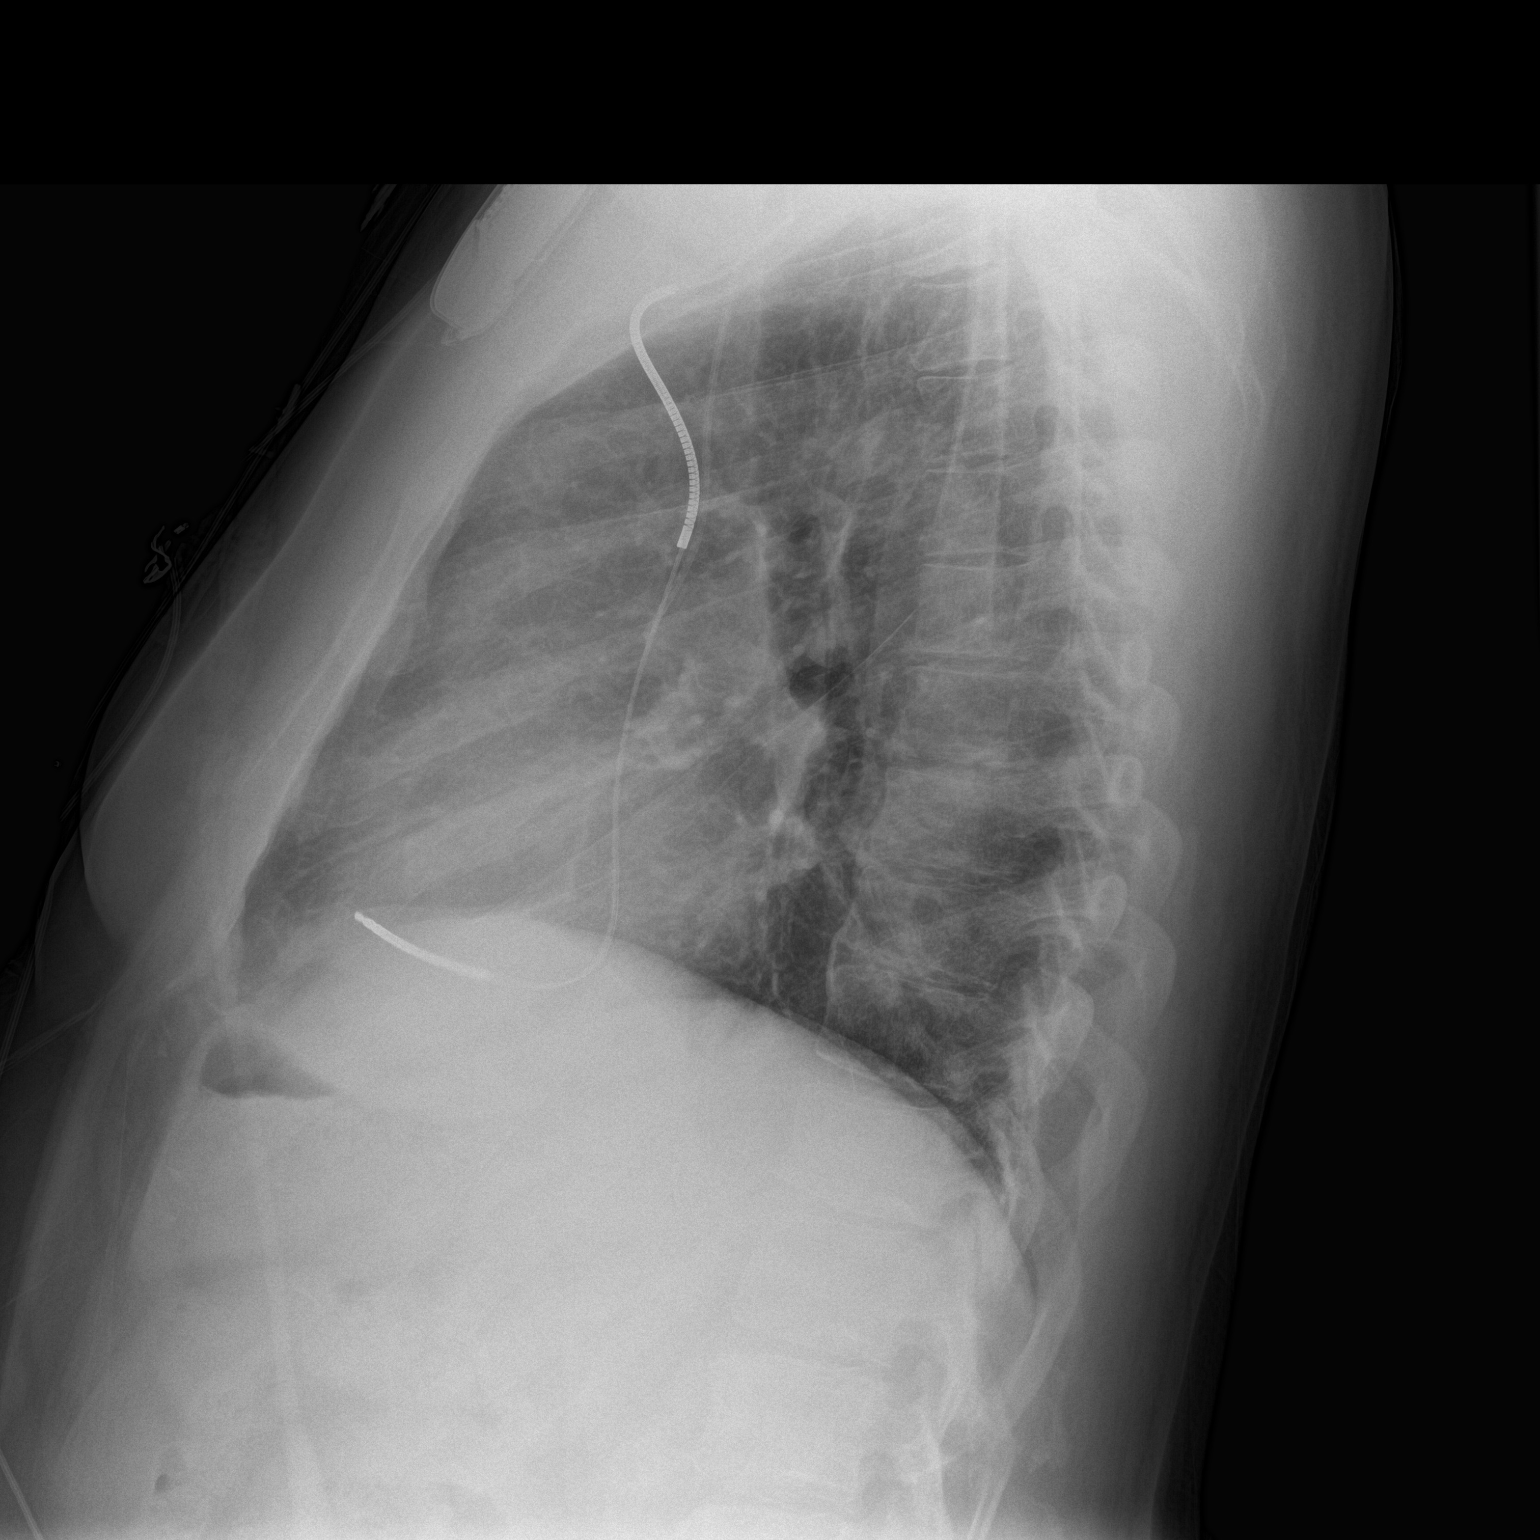

[2 of 2 positions shown; findings below may reference images not displayed]

FINDINGS: AICD in place. PICC line in place with the tip just below the level
of the carina in good position.

Heart size and pulmonary vascularity are normal. Lungs are clear
except for a tiny area of linear scarring at the right lung base
laterally.

No acute osseous abnormality.
IMPRESSION: No active cardiopulmonary disease.

## 2015-06-29 IMAGING — XA IR FLUORO GUIDE CV LINE*R*
1 series · 2 of 2 positions shown · non-contrast
Comparison: none

CLINICAL DATA: Chronic systolic CHF, needs long-term venous access
for iv therapy.

EXAM:
RIGHT IJ CENTRAL VENOUS CATHETER PLACEMENT UNDER ULTRASOUND AND
FLUOROSCOPIC GUIDANCE:
FLUOROSCOPY TIME:  6 seconds
TECHNIQUE: Patency of the right IJ vein was confirmed with ultrasound with
image documentation. An appropriate skin site was determined. Skin
site was marked. Region was prepped using maximum barrier technique
including cap and mask, sterile gown, sterile gloves, large sterile
sheet, and Chlorhexidine as cutaneous antisepsis. The region was
infiltrated locally with 1% lidocaine. Under real-time ultrasound
guidance, the right IJ vein was accessed with a 21 gauge
micropuncture needle; the needle tip within the vein was confirmed
with ultrasound image documentation. The needle exchanged over a
guidewire for peel-away

[Series 1: run · 2 of 2 slices shown]
[im 1/2]
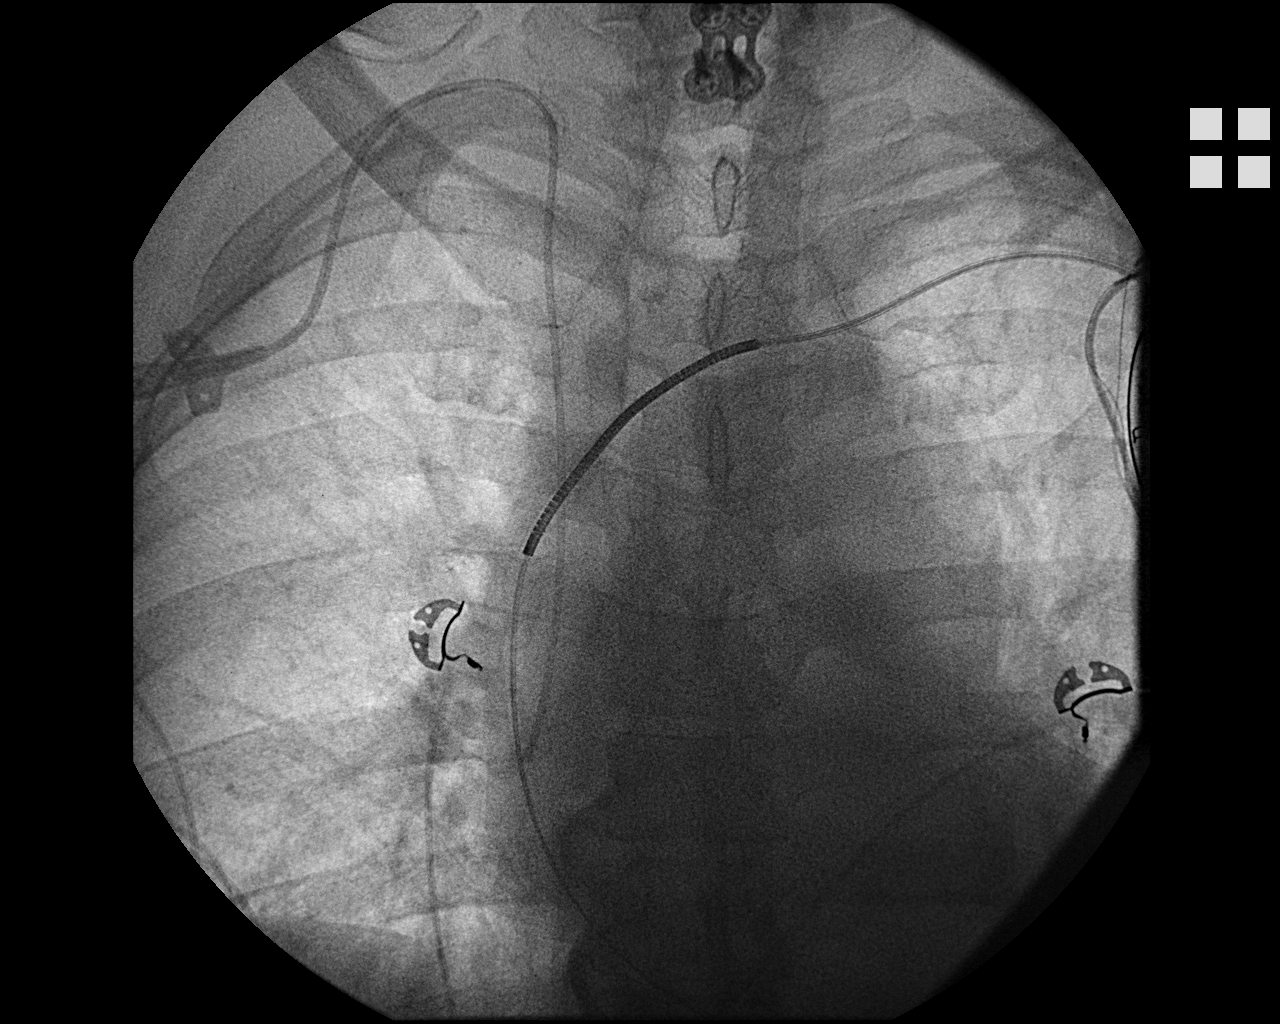
[im 2/2]
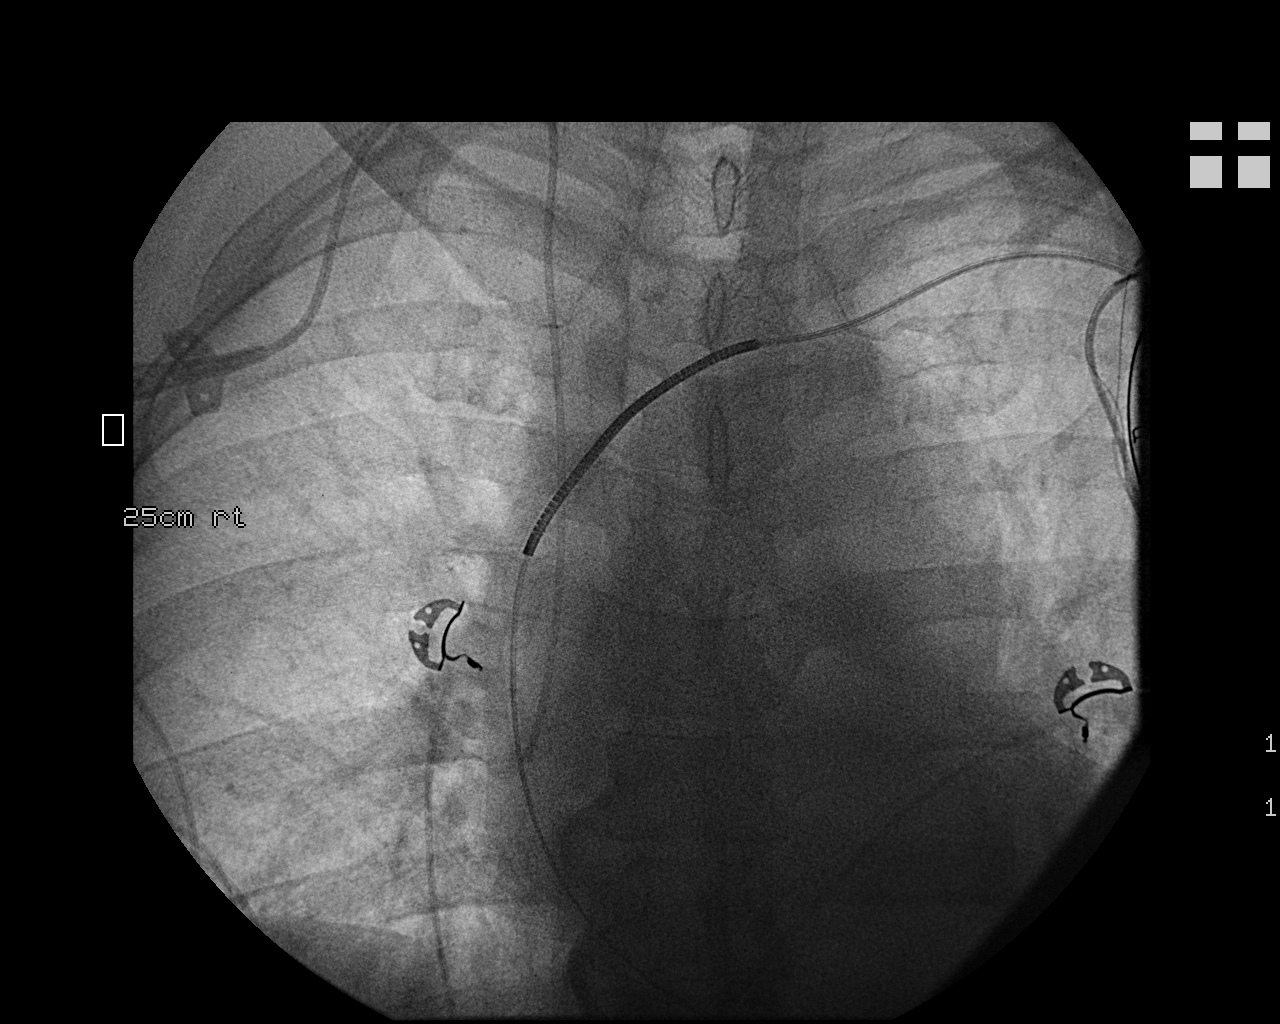

[2 of 2 positions shown; findings below may reference images not displayed]

sheath through which an 25cm 5 French PowerPICC dual lumen catheter
was

advanced. This was positioned with the tip at the cavoatrial
junction. Spot

chest radiograph shows good positioning and no pneumothorax.
Catheter was flushed

and sutured externally with 0-Prolene sutures. Patient tolerated the
procedure

well, with no immediate complication.
IMPRESSION: 1. Technically successful right IJ double lumen power injectable
central venous catheter placement.

## 2015-09-07 IMAGING — CR DG CHEST 2V
2 series · 2 of 2 positions shown · non-contrast
Comparison: 10/10/2013

CLINICAL DATA: Cough and congestion for 2 days.  Weakness.

EXAM:
CHEST  2 VIEW

[chest pa]
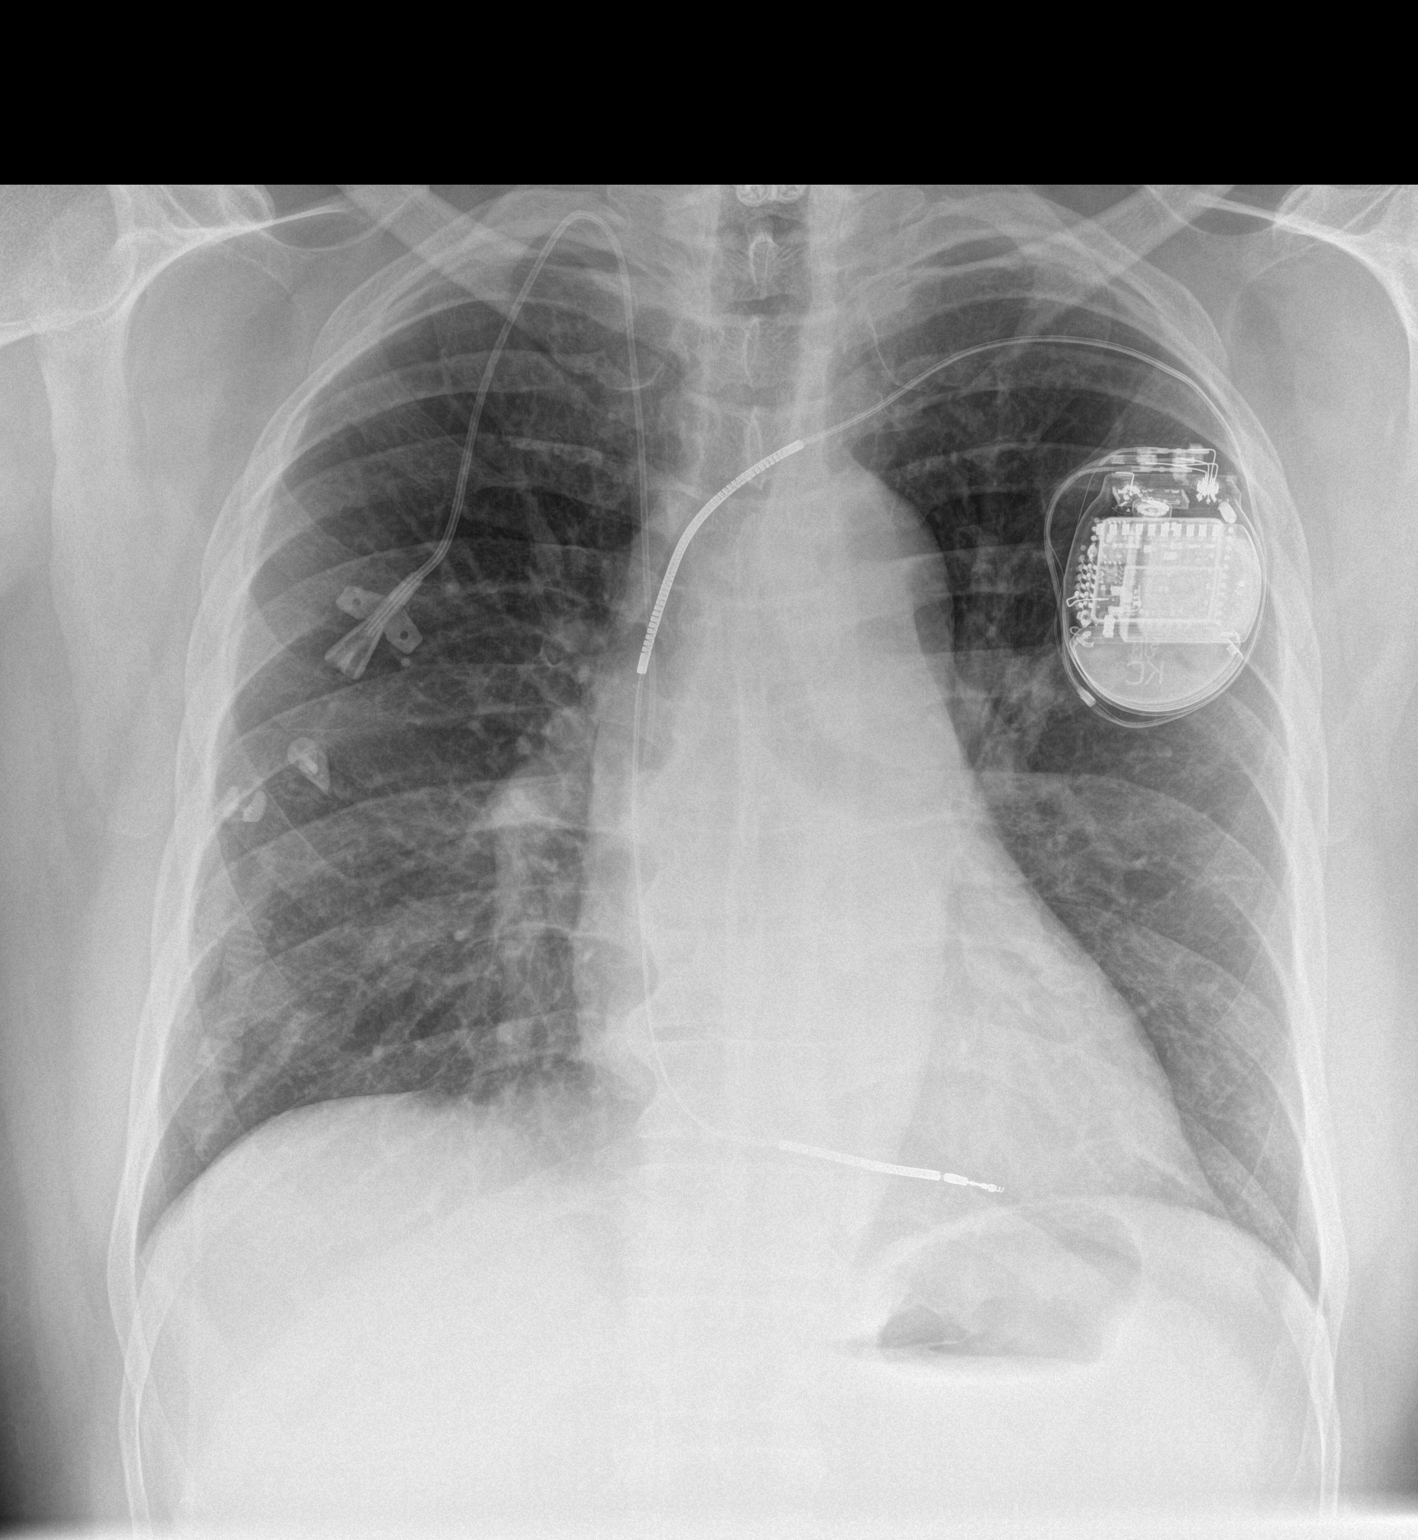

[chest lat]
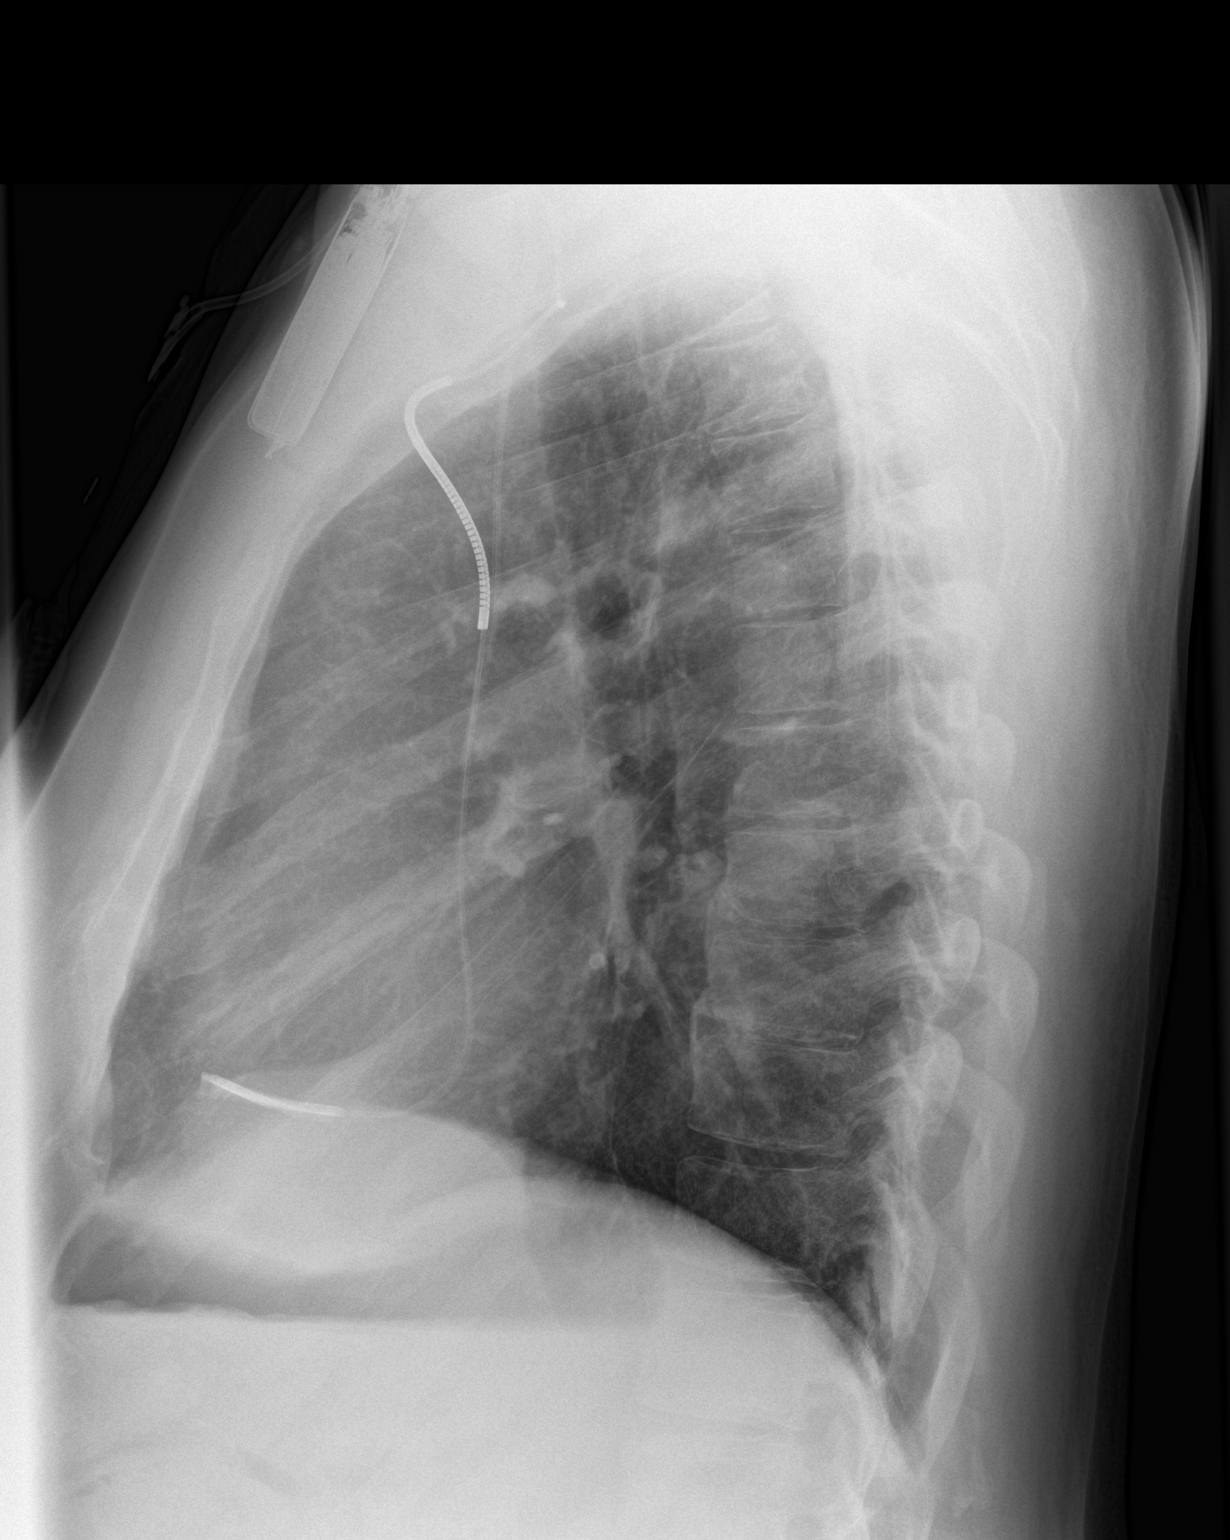

[2 of 2 positions shown; findings below may reference images not displayed]

FINDINGS: Tip of the right central line in the mid SVC. Single lead pacemaker
remains in place, lead projecting over the right ventricle. The
heart size and mediastinal contours are unchanged, mild tortuosity
of the thoracic aorta. The lungs are clear. No consolidation,
pleural effusion, or pneumothorax. No acute osseous abnormalities
are seen, mild degenerative change in the spine.
IMPRESSION: No acute pulmonary process.

## 2015-12-24 IMAGING — US US ABDOMEN COMPLETE
1 series · 14 of 25 positions shown · non-contrast
Comparison: 08/12/2013

CLINICAL DATA: Abdominal distention. Evaluate for cirrhosis.
History CHF, CAD, and hypertension.

EXAM:
ULTRASOUND ABDOMEN COMPLETE

[Series 1: us abdomen complete · 0.16mm/px · 14 of 97 slices shown]
[im 1/97]
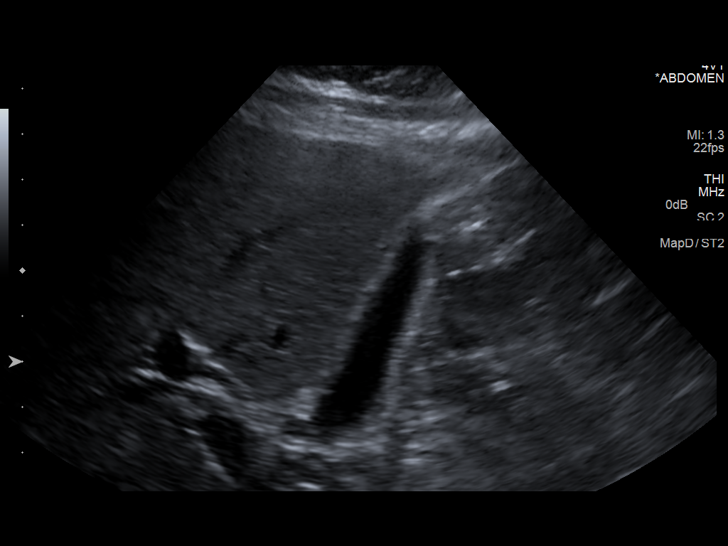
[im 9/97]
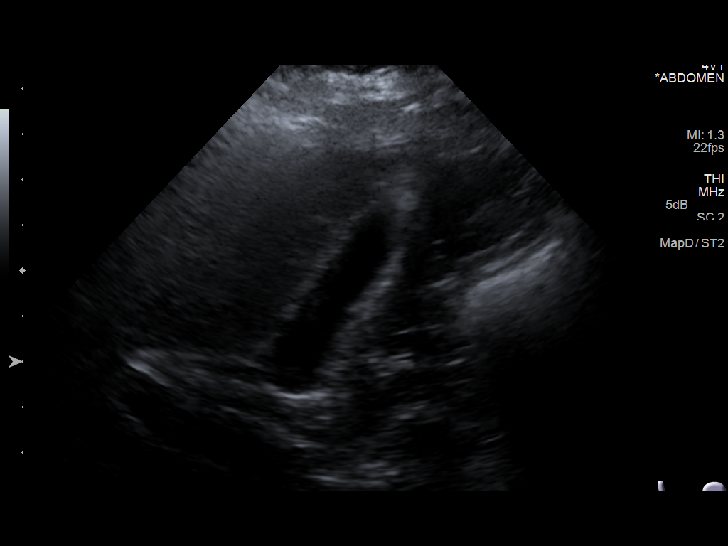
[im 17/97]
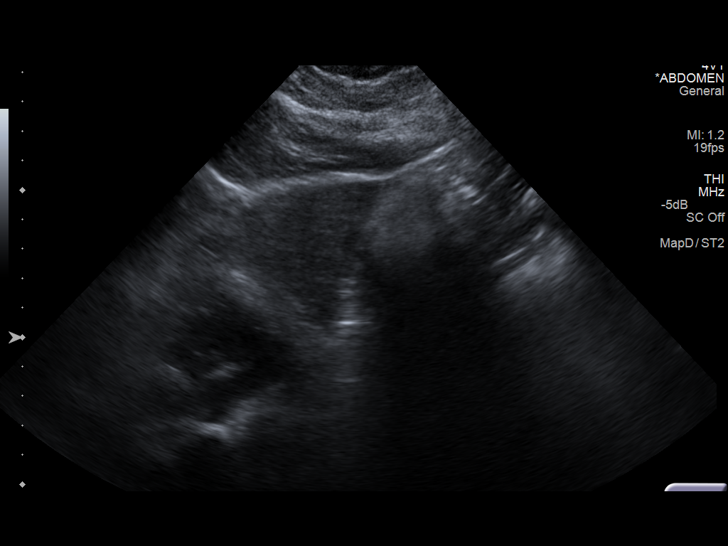
[im 25/97]
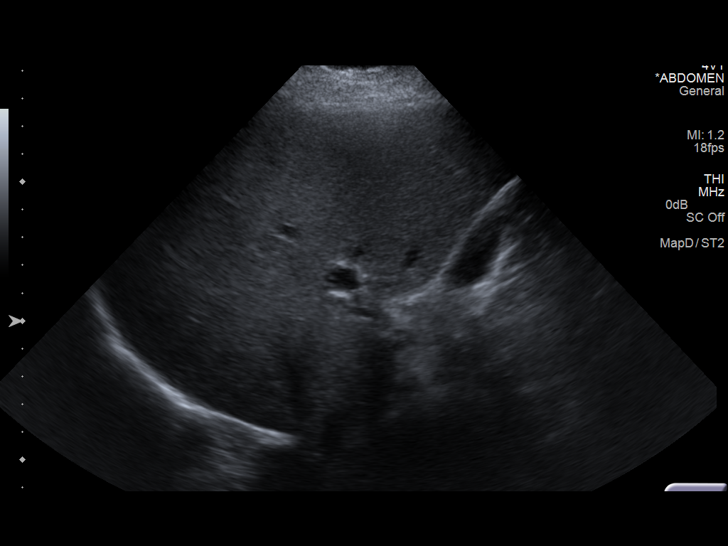
[im 33/97]
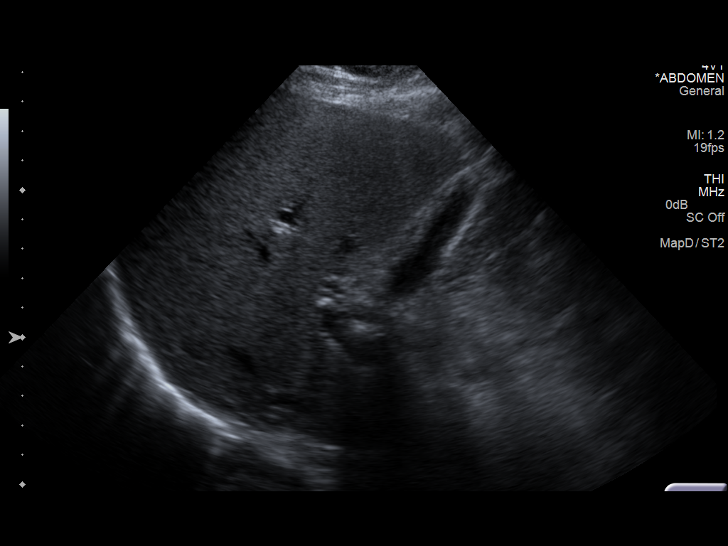
[im 37/97]
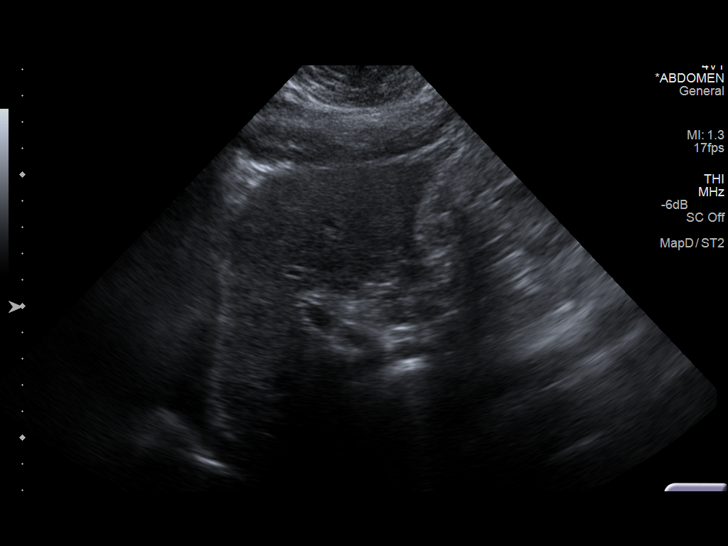
[im 45/97]
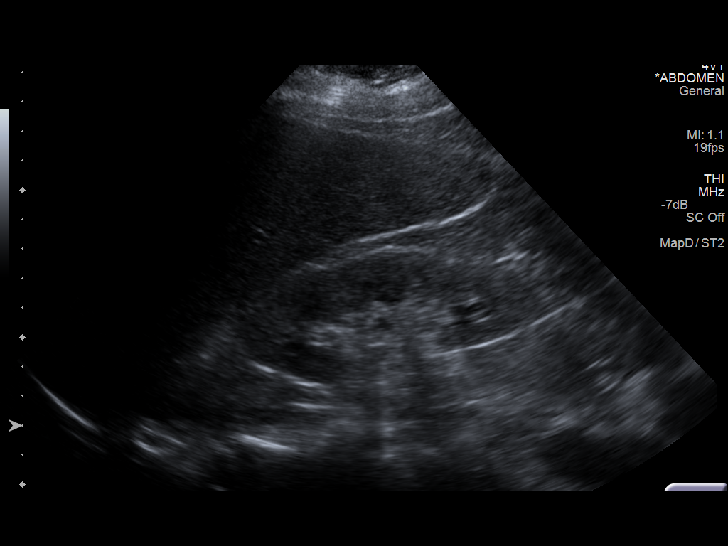
[im 53/97]
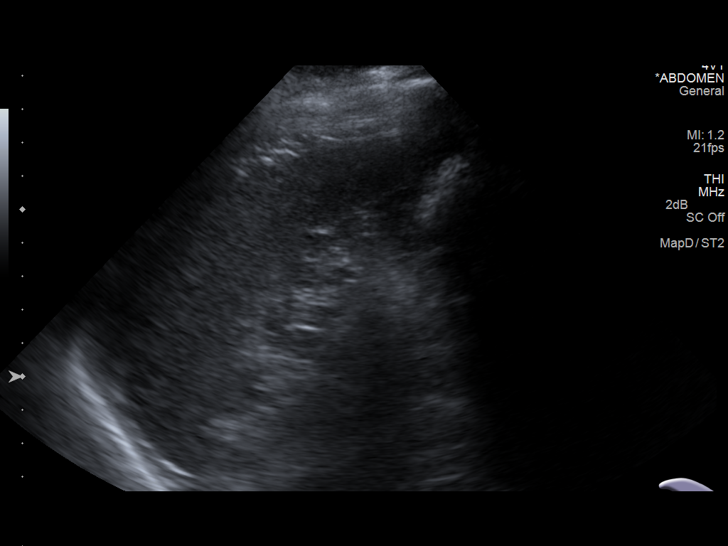
[im 61/97]
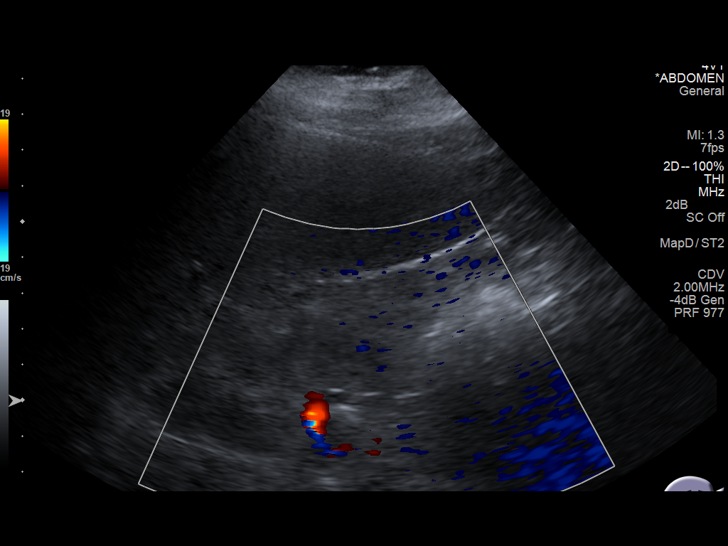
[im 65/97]
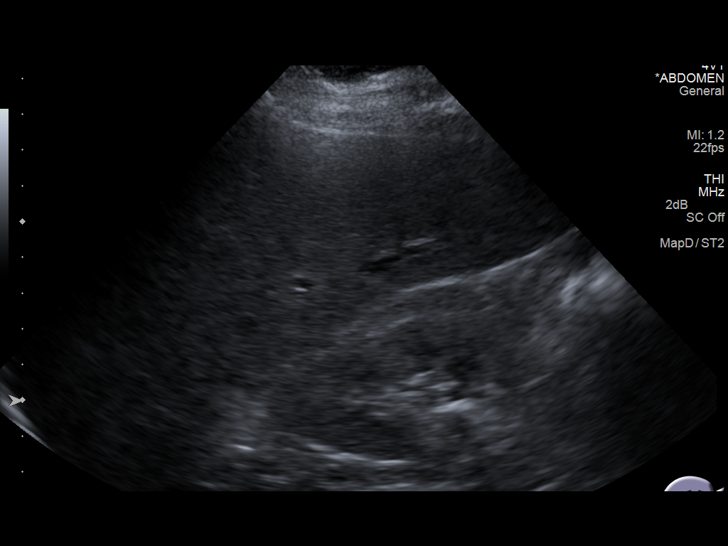
[im 73/97]
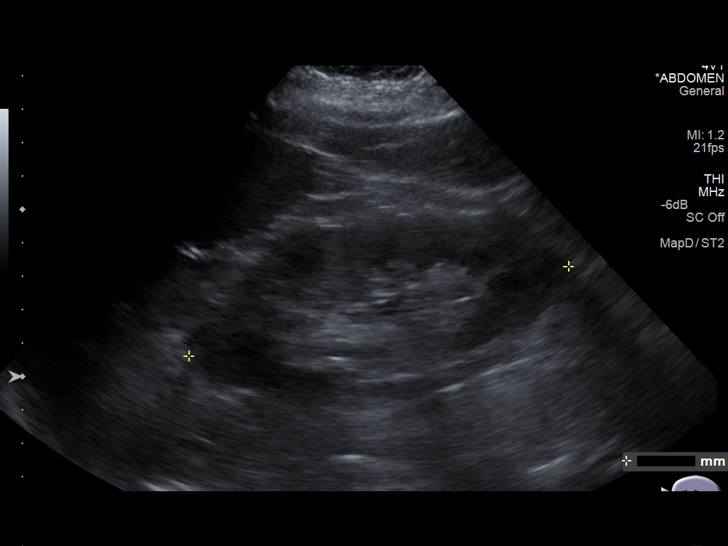
[im 81/97]
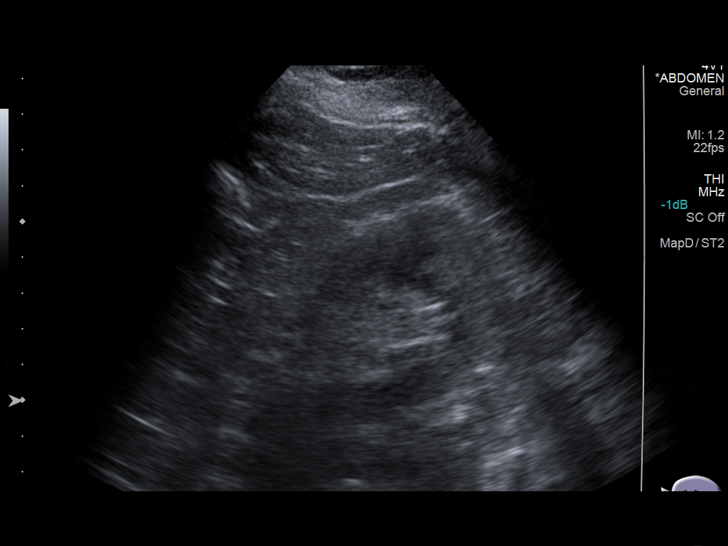
[im 89/97]
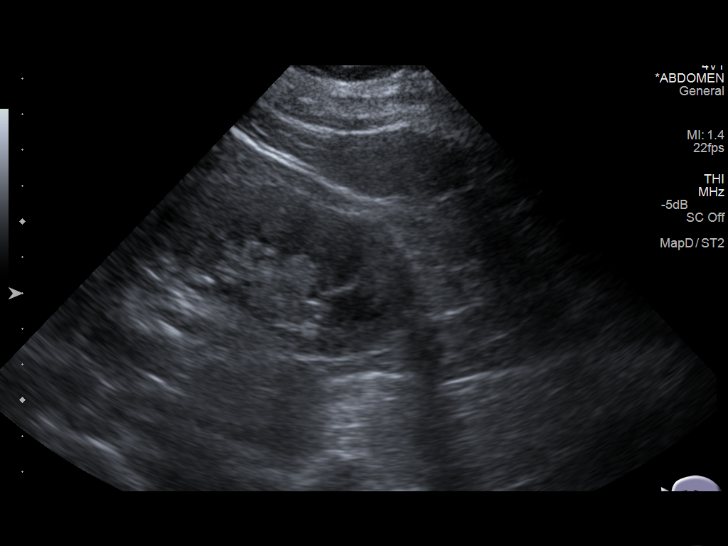
[im 97/97]
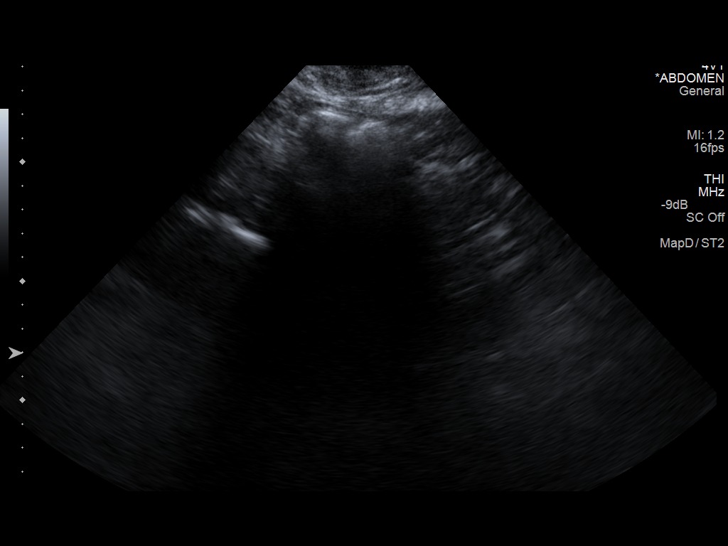

[14 of 25 positions shown; findings below may reference images not displayed]

FINDINGS: Gallbladder: Gallbladder has a normal appearance. Gallbladder wall
is 2.3 mm, within normal limits. No stones or pericholecystic fluid.
No sonographic Murphy's sign.

Common bile duct: Diameter: 5.2 mm

Liver: Mildly echogenic without focal lesion.

IVC: No abnormality visualized.

Pancreas: Not well seen because of bowel gas

Spleen: Size and appearance within normal limits.

Right Kidney: Length: 10.4 cm. Echogenicity within normal limits. No
mass or hydronephrosis visualized.

Left Kidney: Length: 11.7 cm. Echogenicity within normal limits. No
mass or hydronephrosis visualized.

Abdominal aorta: 2.7 cm

Other findings: None.
IMPRESSION: 1. No evidence for acute cholecystitis.
2. Mildly echogenic liver without other features of hepatic
steatosis.

## 2016-01-08 IMAGING — DX DG CHEST 2V
2 series · 2 of 2 positions shown · non-contrast
Comparison: 03/26/2014

CLINICAL DATA: Chest pain.  Weight loss.

EXAM:
CHEST  2 VIEW

[w chest pa]
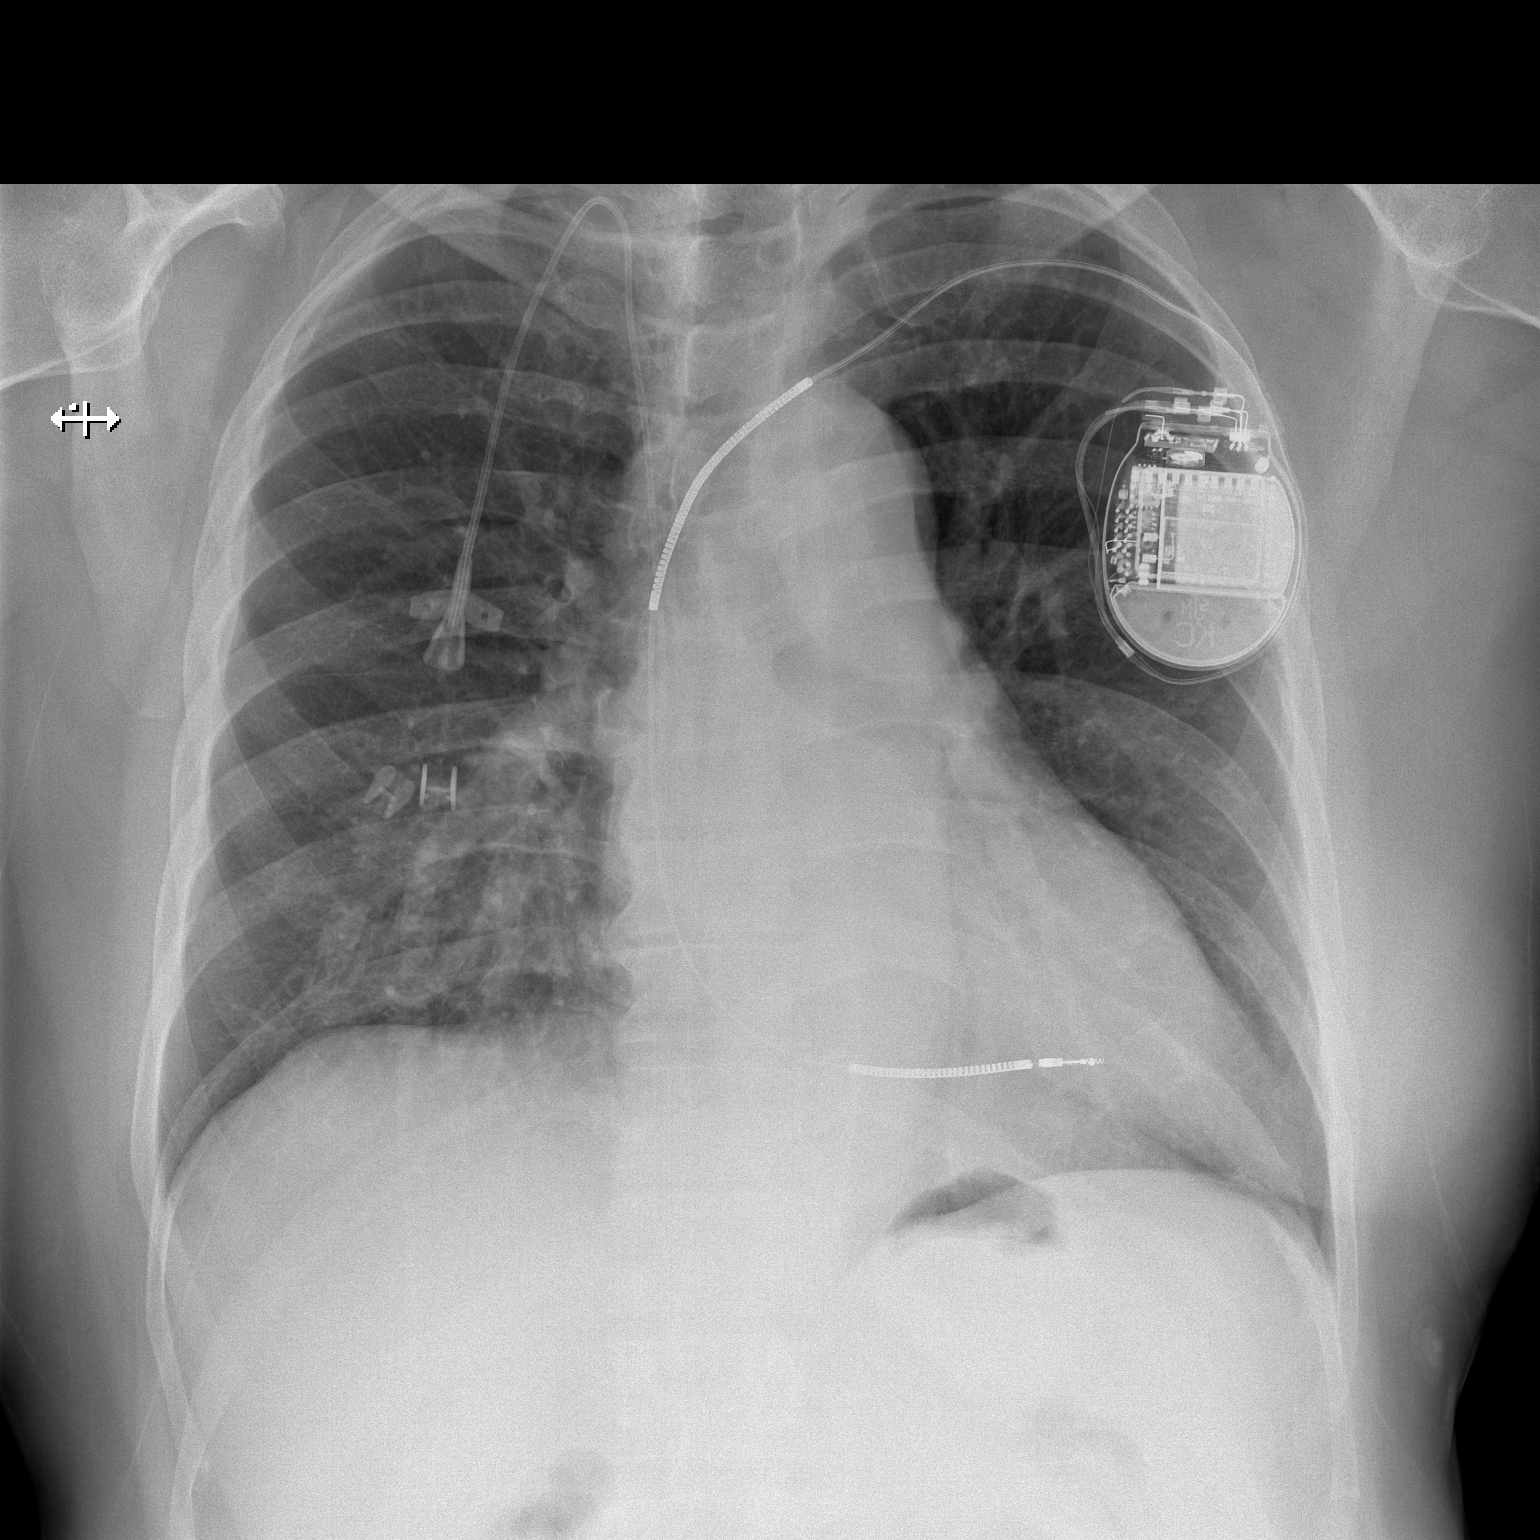

[w chest lat]
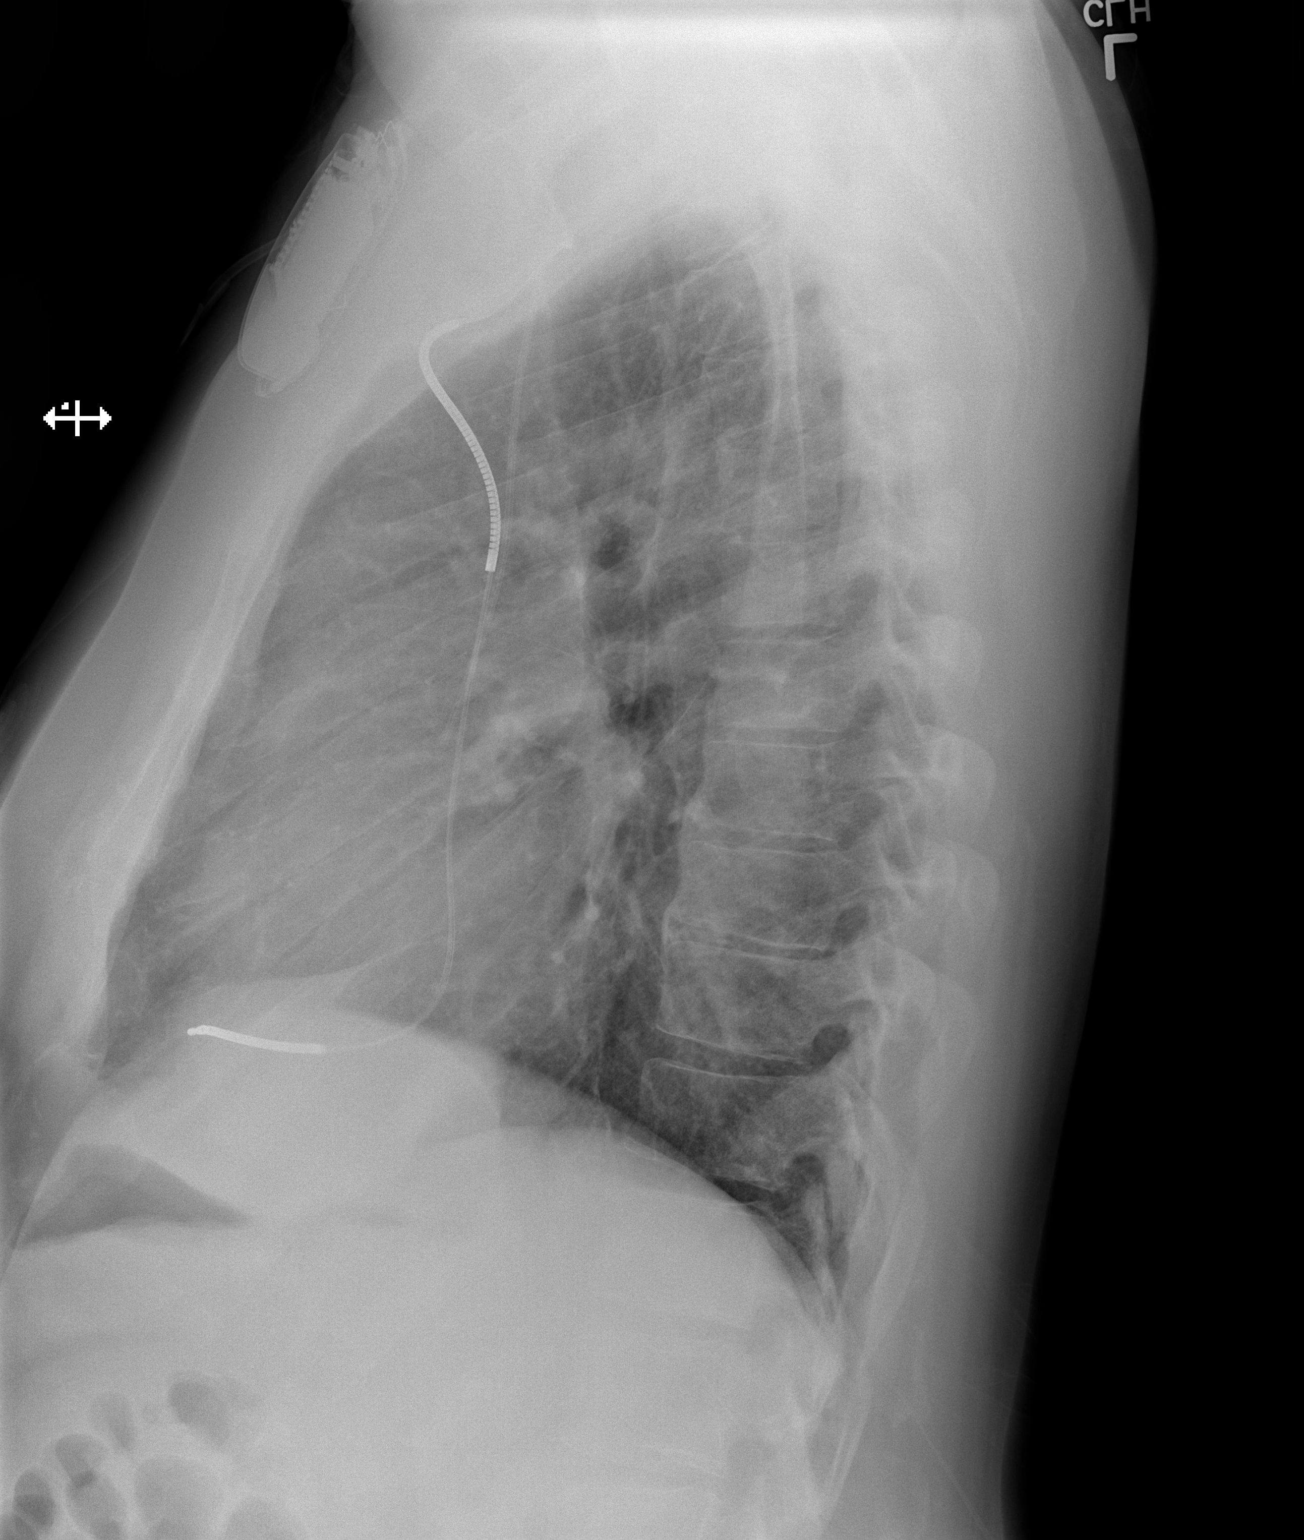

[2 of 2 positions shown; findings below may reference images not displayed]

FINDINGS: AICD in the right ventricle and unchanged. Right jugular central
venous catheter tip in the SVC also unchanged.

Lungs are clear. No infiltrate effusion or mass. No change from the
prior study.
IMPRESSION: No active cardiopulmonary disease.

## 2016-04-14 IMAGING — US US ABDOMEN COMPLETE
1 series · 13 of 25 positions shown · non-contrast
Comparison: 09/18/2014

CLINICAL DATA: Postprandial epigastric pain with worsening last 2 3
months

EXAM:
ABDOMEN ULTRASOUND COMPLETE

[Series 1: us abdomen complete · 0.18mm/px · 13 of 73 slices shown]
[im 1/73]
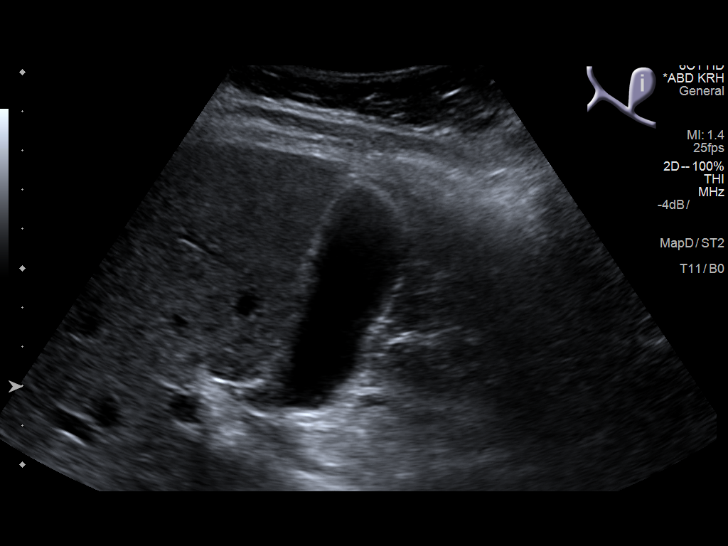
[im 7/73]
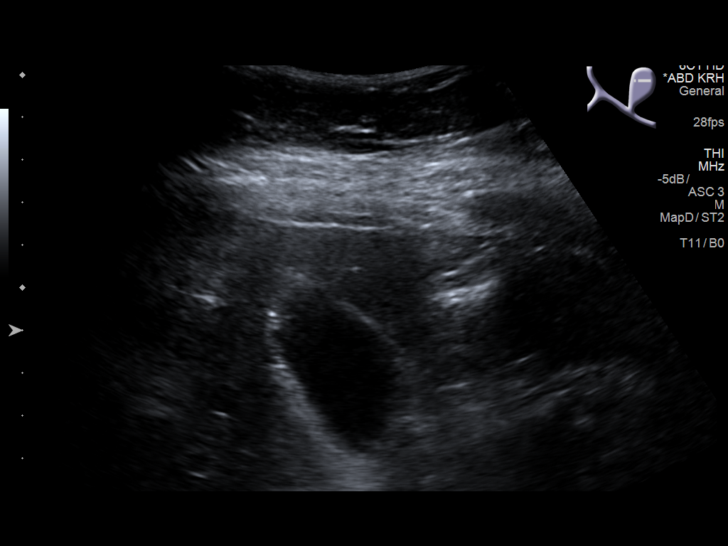
[im 13/73]
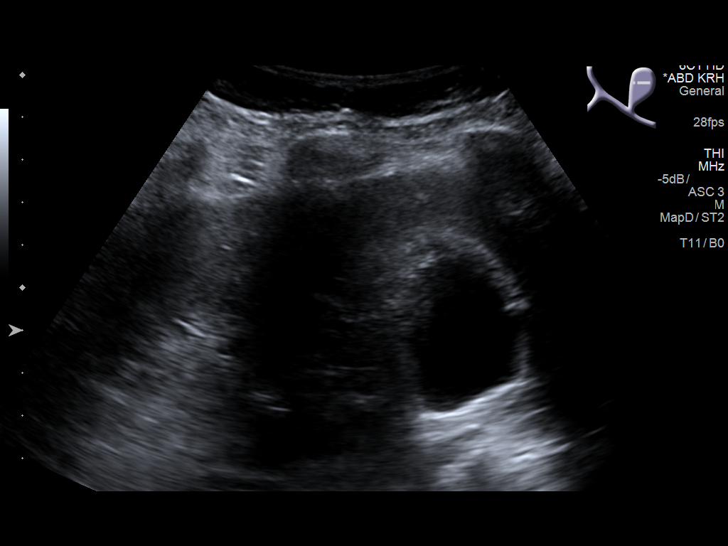
[im 19/73]
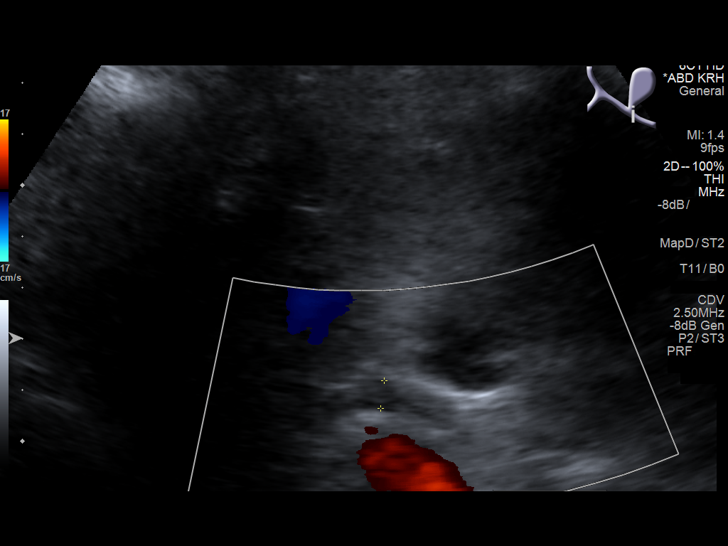
[im 25/73]
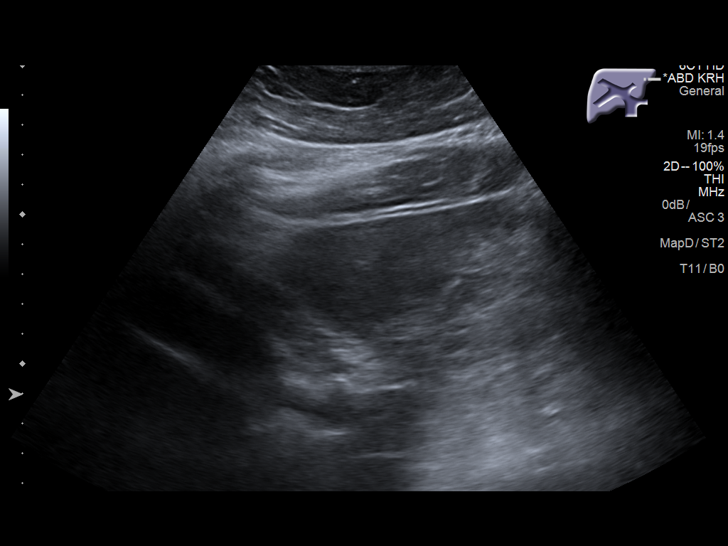
[im 31/73]
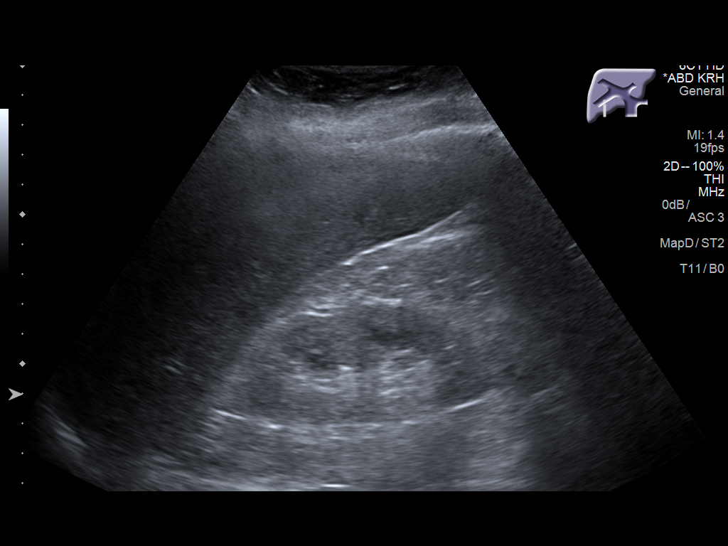
[im 37/73]
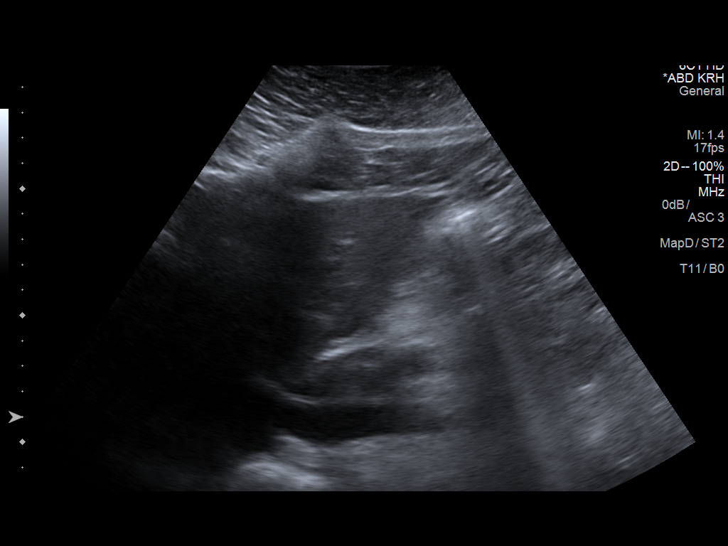
[im 43/73]
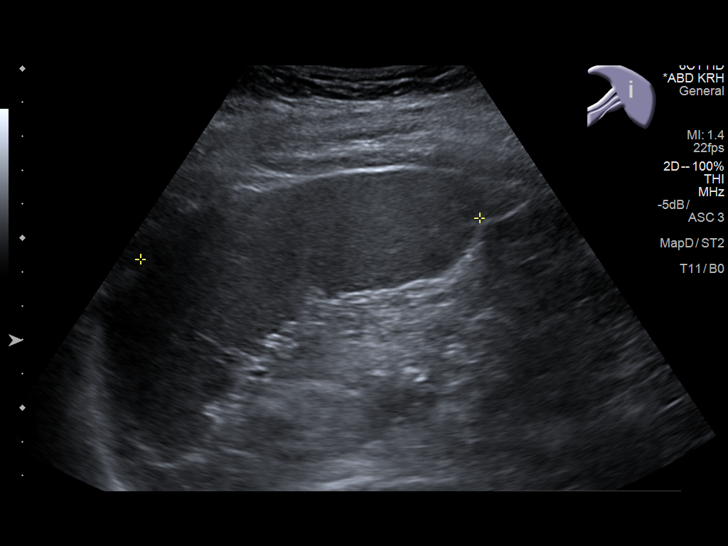
[im 49/73]
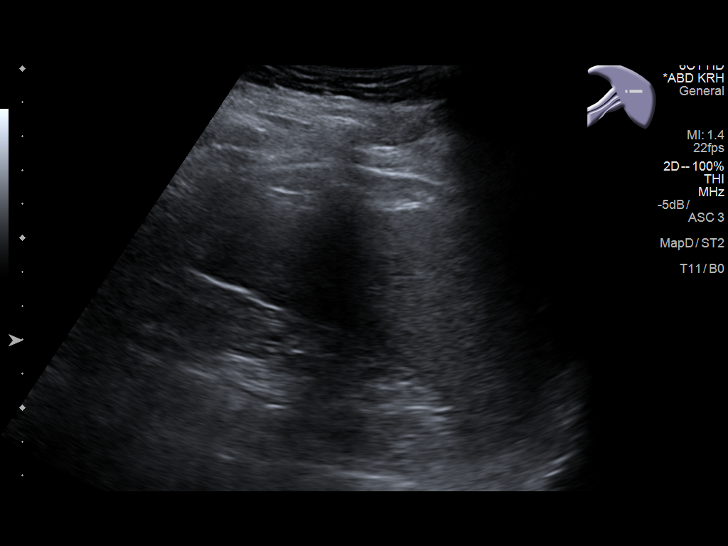
[im 55/73]
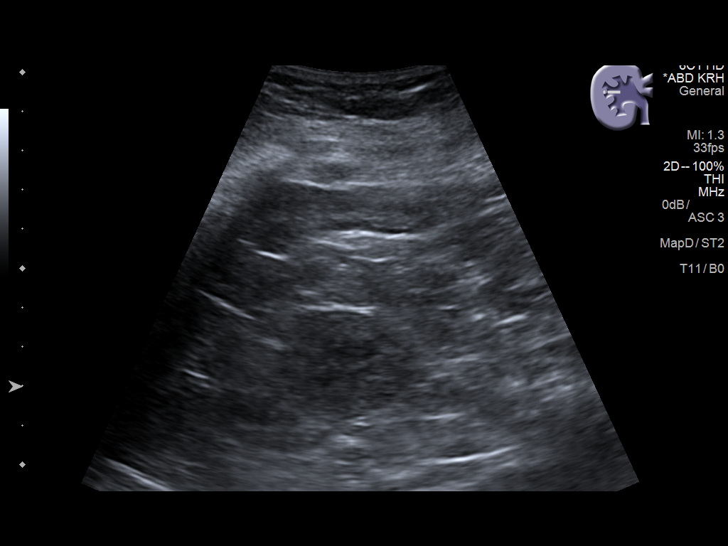
[im 61/73]
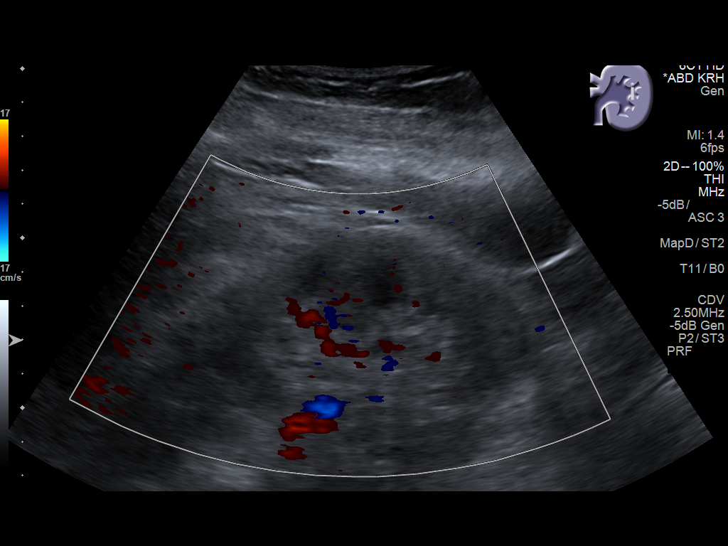
[im 67/73]
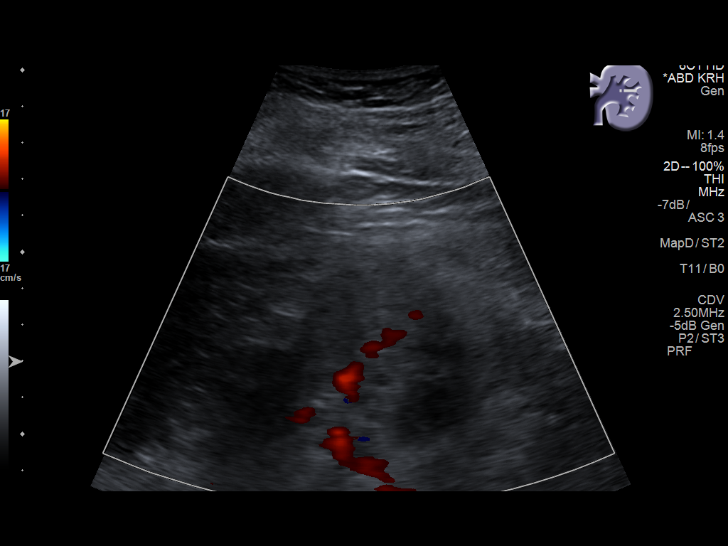
[im 73/73]
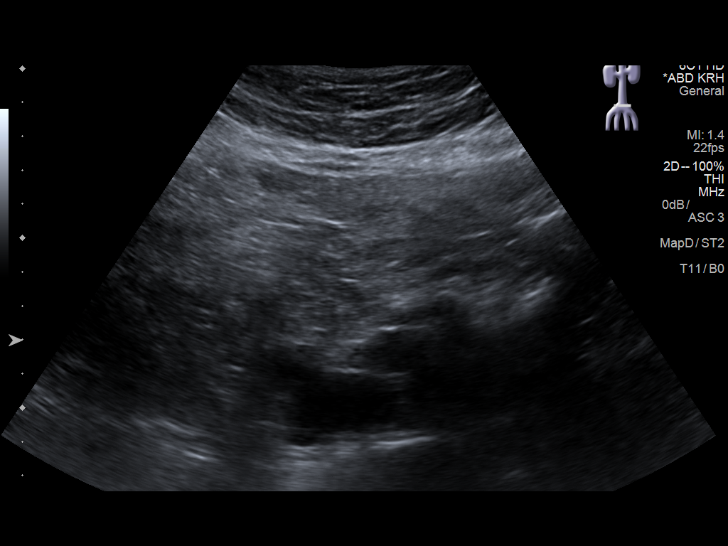

[13 of 25 positions shown; findings below may reference images not displayed]

FINDINGS: Gallbladder: No gallstones or wall thickening visualized. No
sonographic Murphy sign noted by sonographer.

Common bile duct: Diameter: 6 mm in diameter within normal limits.

Liver: No focal hepatic mass. Again noted mild increased
echogenicity of the liver suspicious for fatty infiltration.

IVC: Limited assessment due to abundant bowel gas

Pancreas: Limited assessment due to abundant bowel gas.

Spleen: Size and appearance within normal limits. Measures 10 cm in
length

Right Kidney: Length: 10.6 cm. Echogenicity within normal limits. No
mass or hydronephrosis visualized.

Left Kidney: Length: 10.6 cm . Echogenicity within normal limits. No
mass or hydronephrosis visualized.

Abdominal aorta: No aneurysm visualized. Measures 2.7 cm in
diameter.

Other findings: Suboptimal exam due to abundant bowel gas.
IMPRESSION: 1. No gallstones are noted within gallbladder.  Normal CBD.
2. No hydronephrosis.
3. Mild increased echogenicity of the liver suspicious for fatty
infiltration.
4. No aortic aneurysm.  Suboptimal exam due to abundant bowel gas.

## 2016-05-06 IMAGING — CR DG FOOT COMPLETE 3+V*L*
3 series · 3 of 3 positions shown · non-contrast
Comparison: Plain films left foot 01/25/2013.

CLINICAL DATA: Status post fall the night of 11/22/2014. Diffuse
left foot pain. Initial encounter.

EXAM:
LEFT FOOT - COMPLETE 3+ VIEW

[x foot ap left]
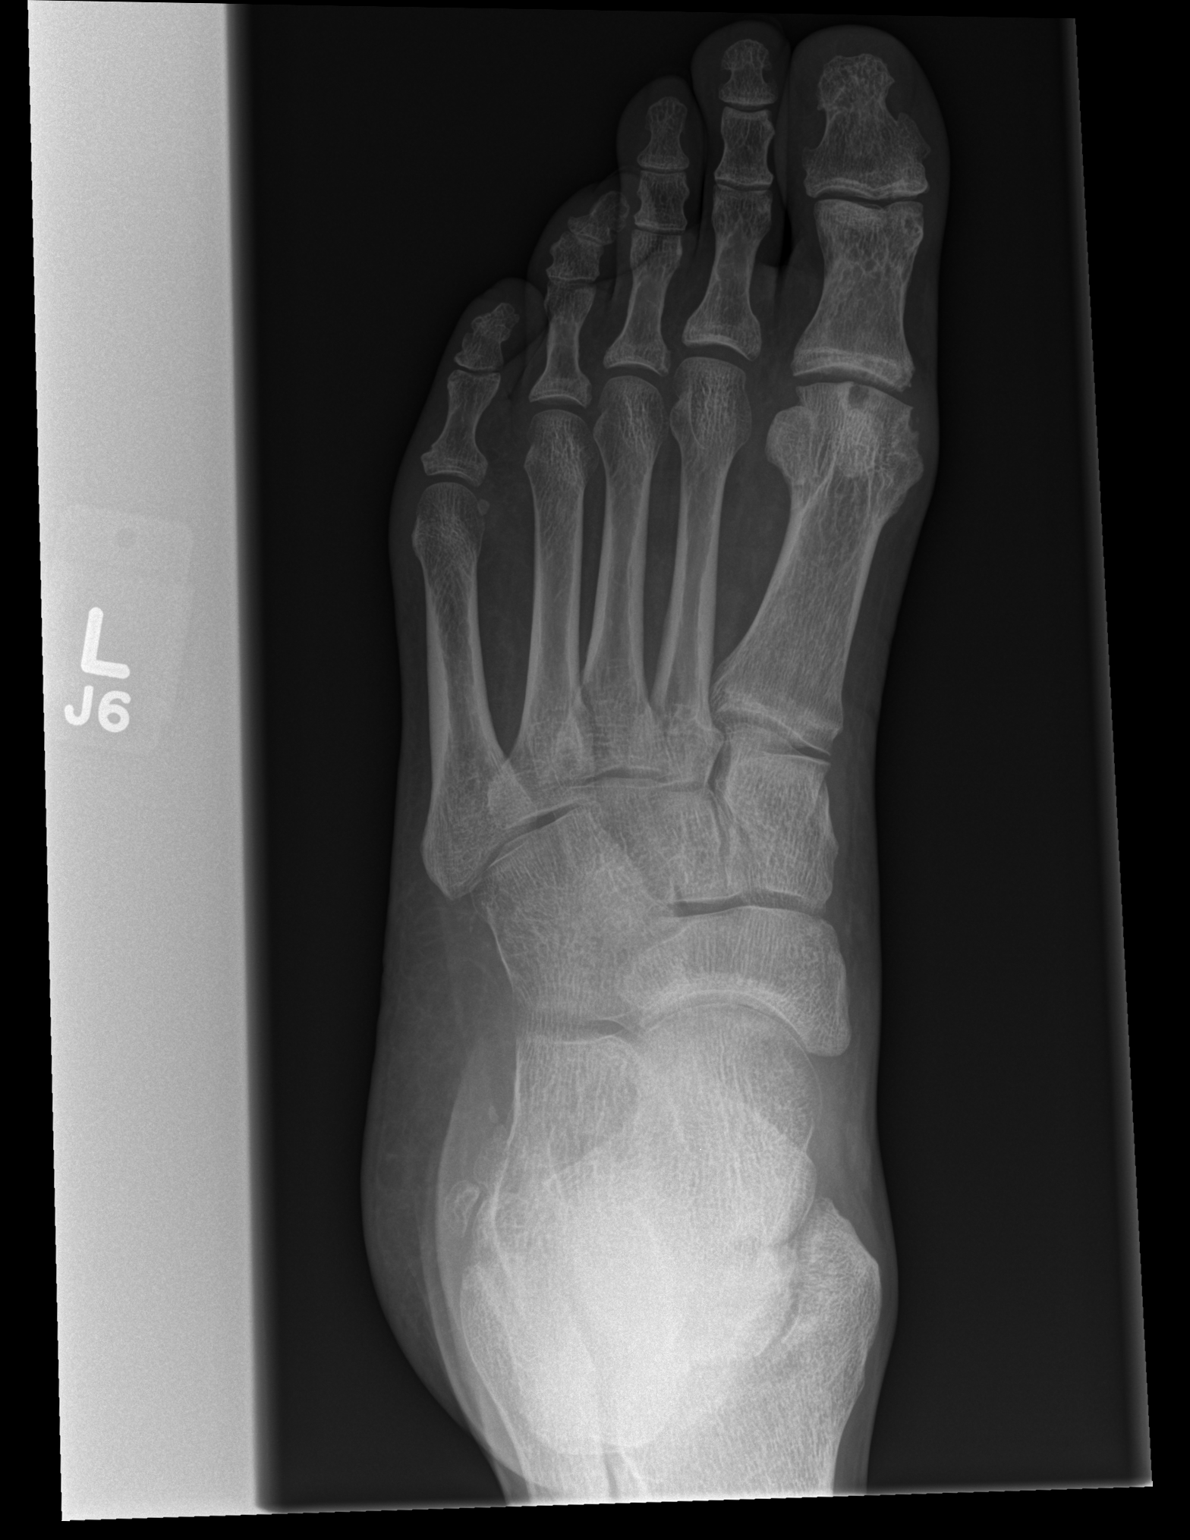

[x foot obl left]
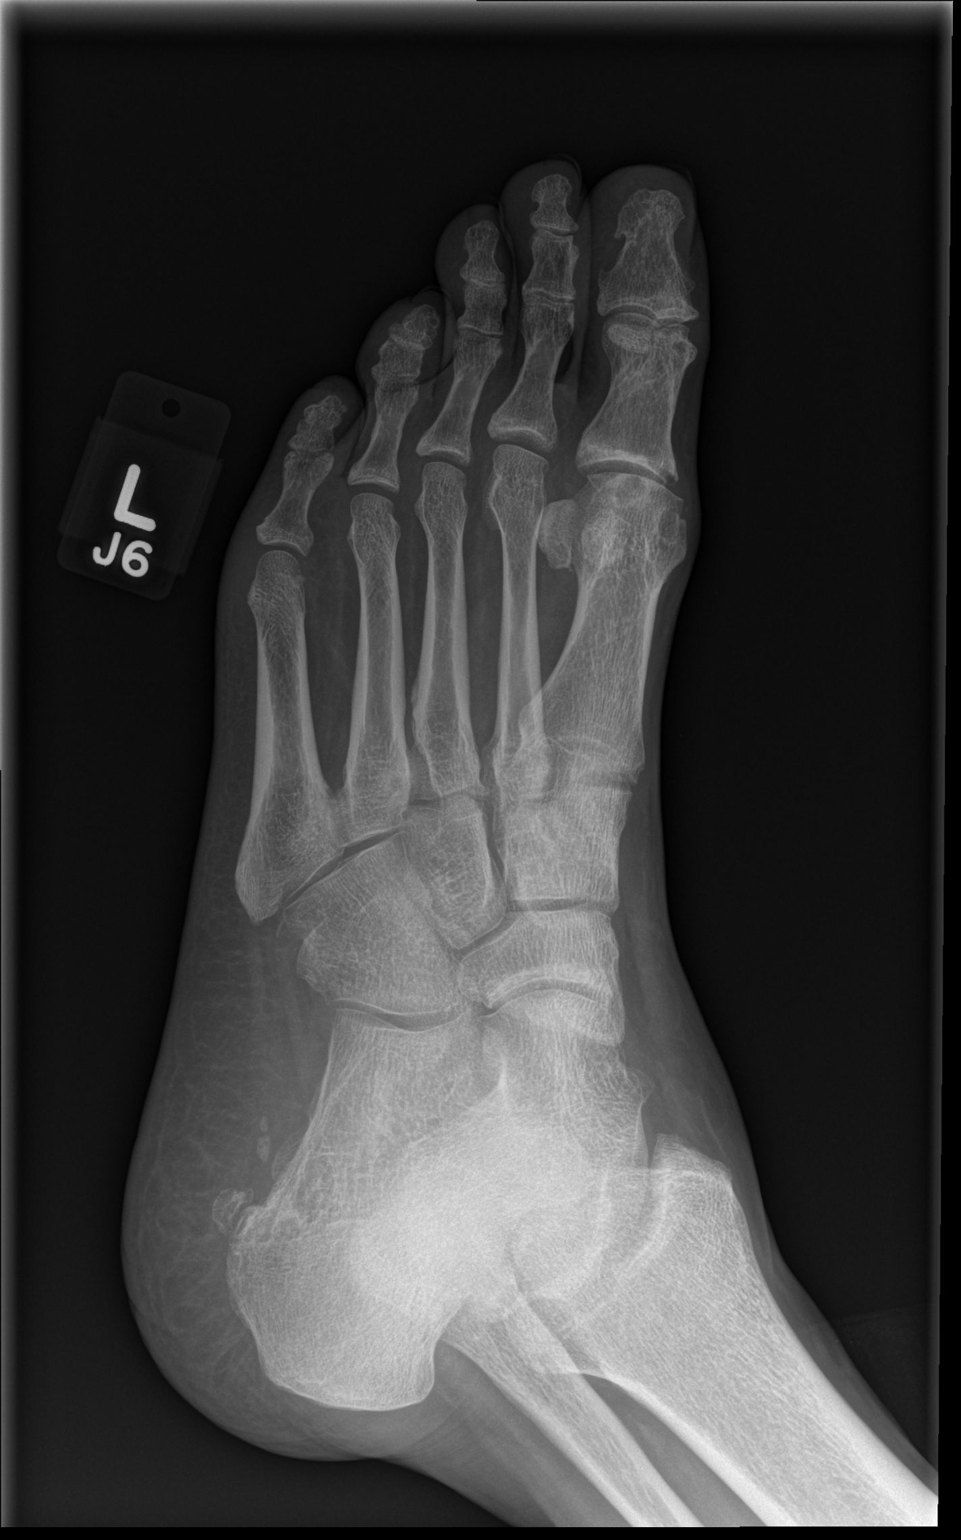

[x foot lat left]
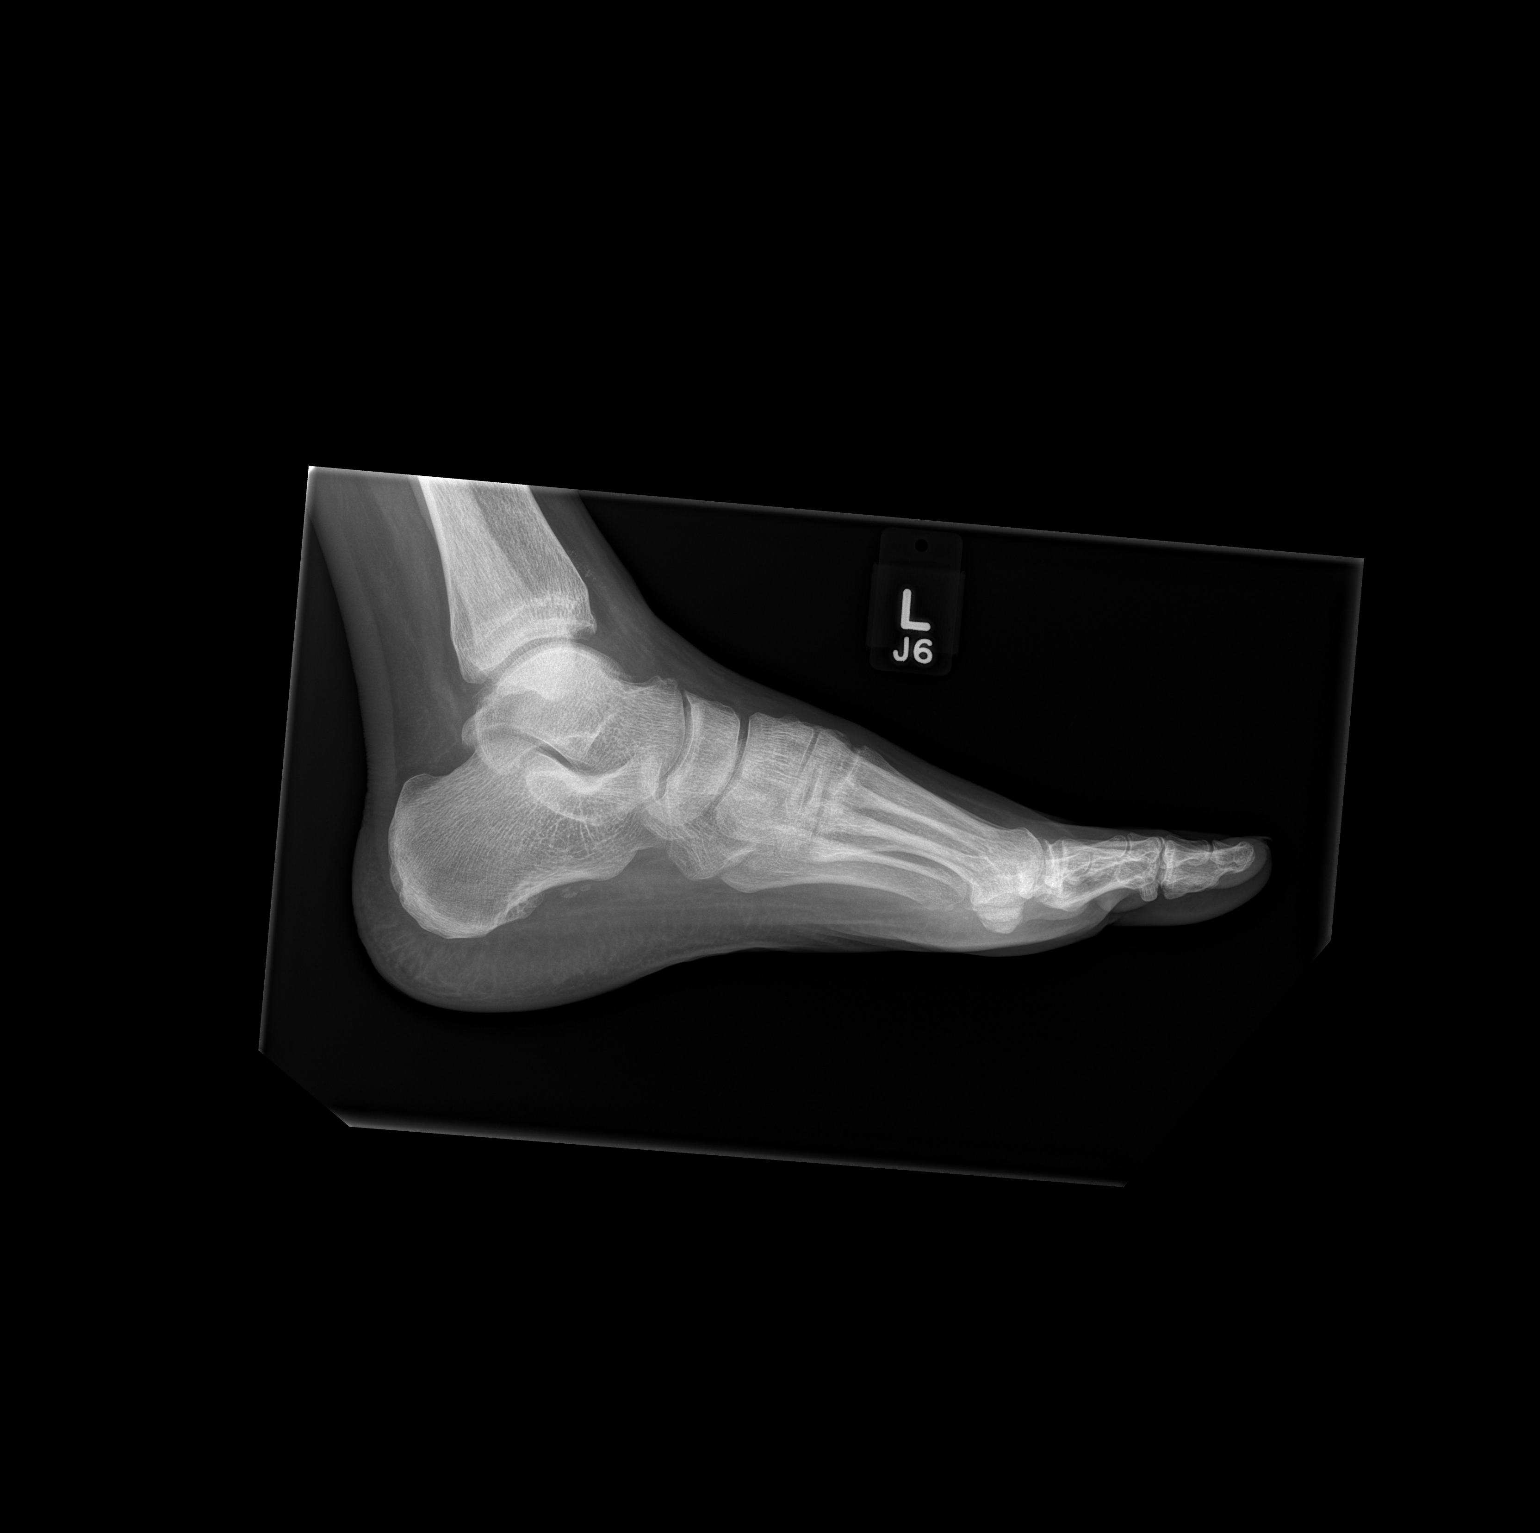

[3 of 3 positions shown; findings below may reference images not displayed]

FINDINGS: No acute bony or joint abnormality is identified. First MTP
degenerative change is noted. Soft tissues are unremarkable.
IMPRESSION: No acute abnormality.

## 2016-05-16 IMAGING — CR DG CHEST 2V
2 series · 2 of 2 positions shown · non-contrast
Comparison: 07/27/2014

CLINICAL DATA: Chest pain

EXAM:
CHEST  2 VIEW

[chest pa]
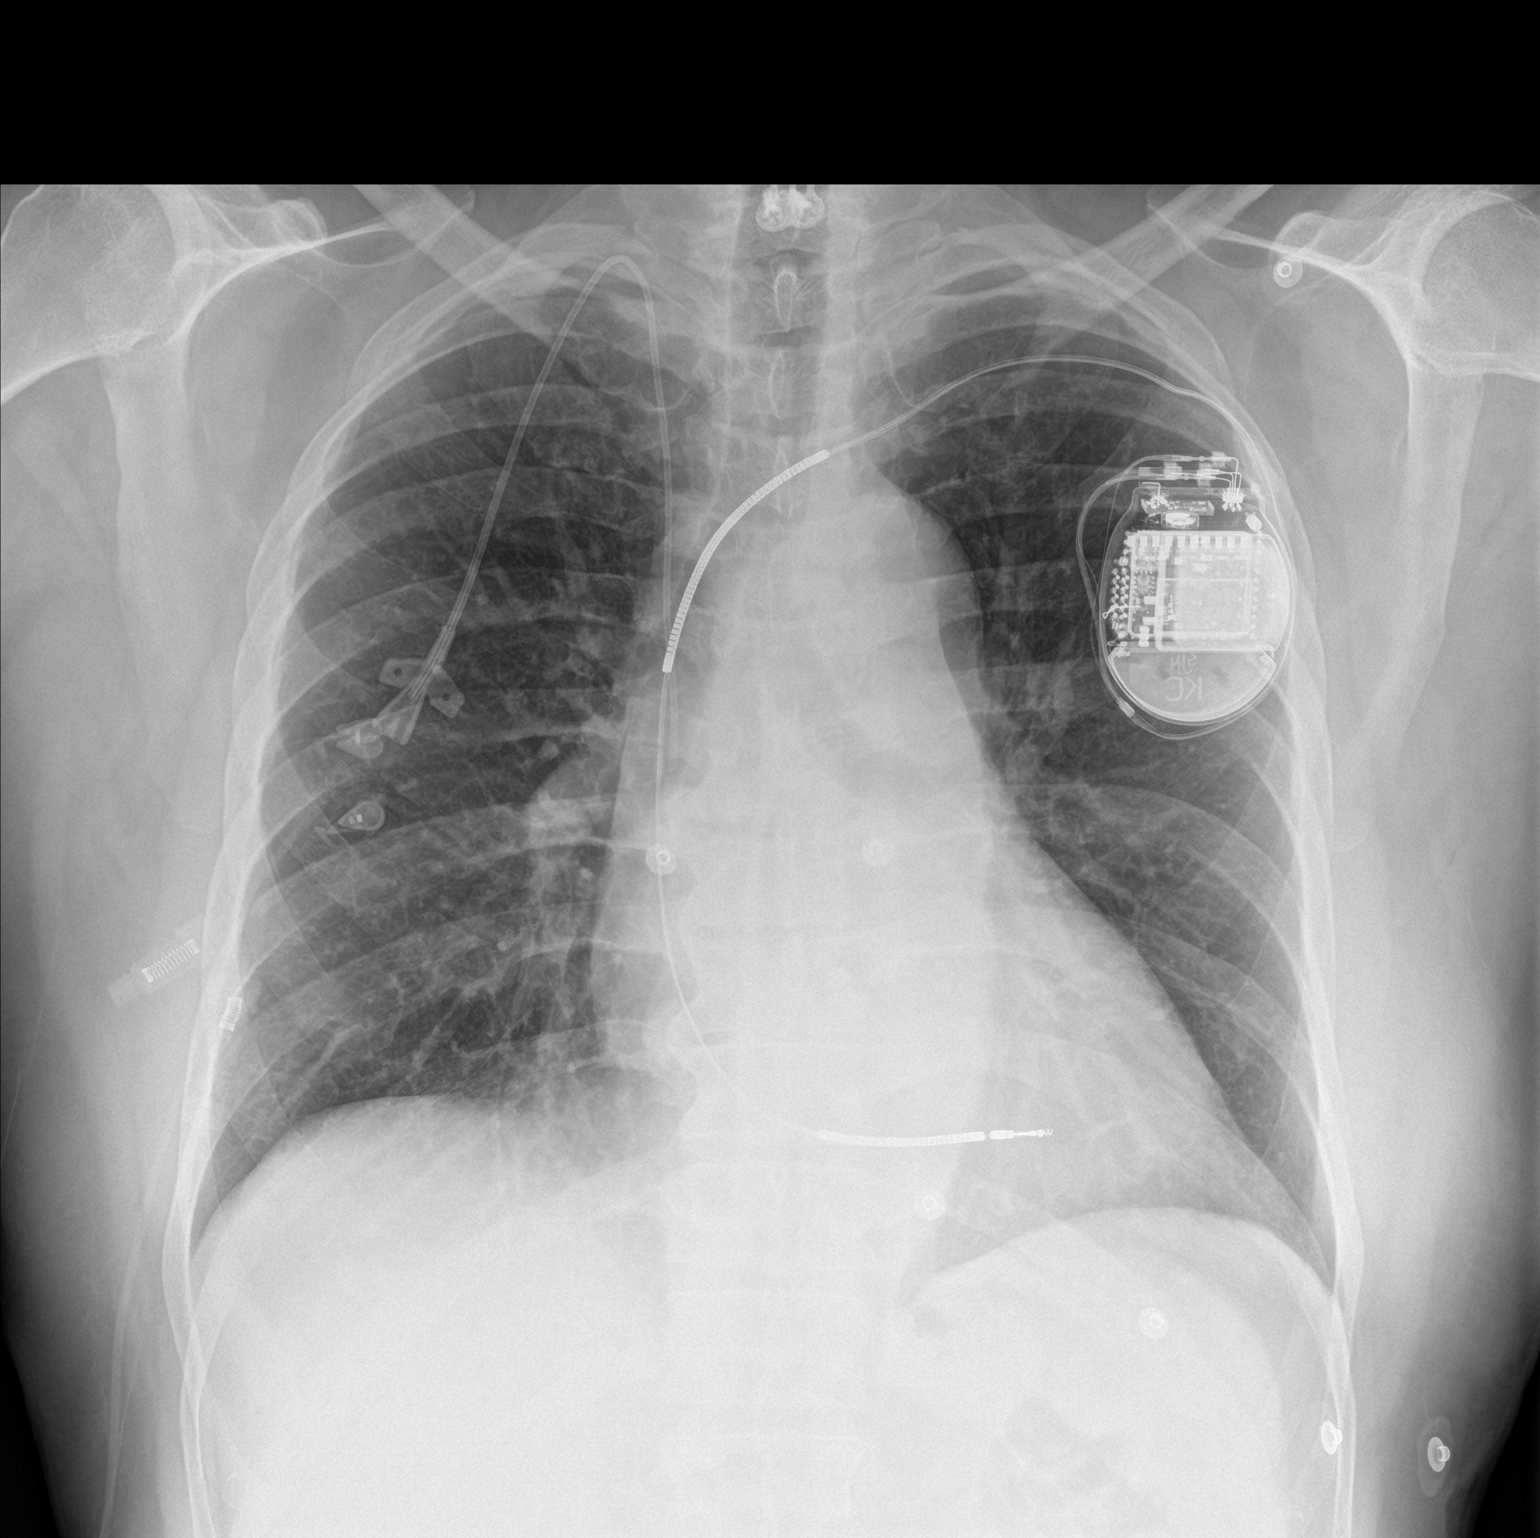

[chest lat]
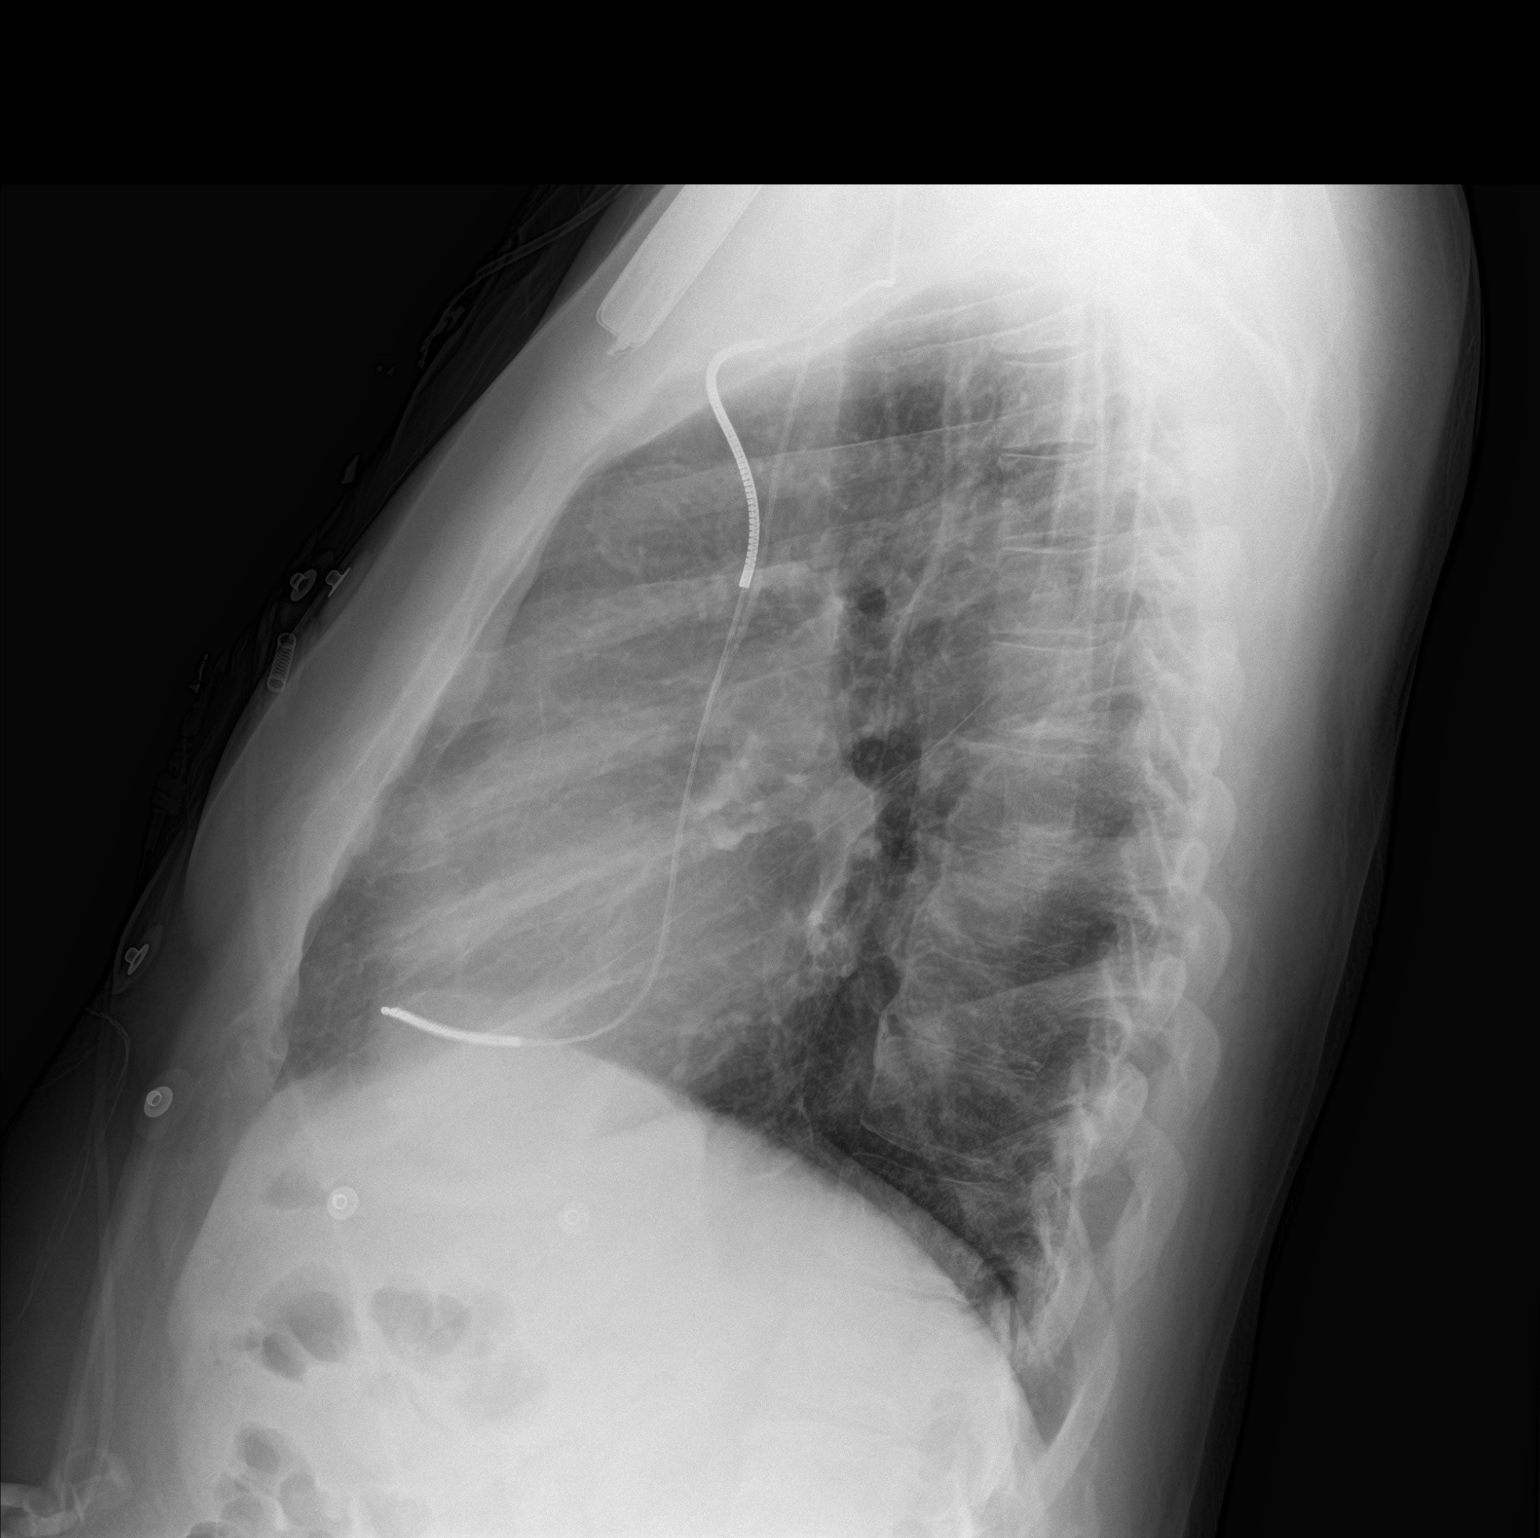

[2 of 2 positions shown; findings below may reference images not displayed]

FINDINGS: Cardiac enlargement. AICD unchanged in position. Negative for heart
failure.

Lungs are clear without infiltrate or effusion

Right jugular central venous catheter tip in the SVC is unchanged
from the prior study
IMPRESSION: No active cardiopulmonary disease.

## 2016-06-27 IMAGING — NM NM GASTRIC EMPTYING
4 series · 4 of 4 positions shown · non-contrast
Comparison: Abdominal ultrasound January 08, 2015

CLINICAL DATA: Early satiety associated with abdominal chest pain
with nausea vomiting bloating and reflux.

EXAM:
NUCLEAR MEDICINE GASTRIC EMPTYING SCAN
TECHNIQUE: After oral ingestion of radiolabeled meal, sequential abdominal
images were obtained for 4 hours. Percentage of activity emptying
the stomach was calculated at 1 hour, 2 hour, 3 hour, and 4 hours.
RADIOPHARMACEUTICALS:  2.0 MCi 9c-99m MDP labeled sulfur colloid
orally in 2 egg whites with 1 piece of toast and 6 ounces of water.

[Series 1: 0 min · 4.14mm/px · 1 of 1 slices shown]
[im 1/1]
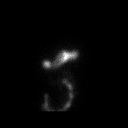

[Series 1: 1 hr · 4.14mm/px · 1 of 1 slices shown]
[im 1/1]
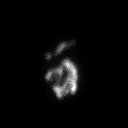

[Series 2: 2 hr · 4.14mm/px · 1 of 1 slices shown]
[im 1/1]
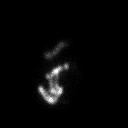

[Series 3: 3 hr · 4.14mm/px · 1 of 1 slices shown]
[im 1/1]
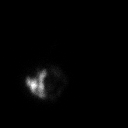

[4 of 4 positions shown; findings below may reference images not displayed]

FINDINGS: Expected location of the stomach in the left upper quadrant.
Ingested meal empties the stomach gradually over the course of the
study.

Sixty-five percent emptied at 1 hr ( normal >= 10%)

Seventy-seven percent emptied at 2 hr ( normal >= 40%)

95% emptied at 3 hr ( normal >= 70%)
IMPRESSION: Normal. Gastric emptying study.

## 2016-06-28 IMAGING — CR DG CHEST 2V
2 series · 2 of 2 positions shown · non-contrast
Comparison: December 03, 2014.

CLINICAL DATA: Chest pain for 2 weeks.

EXAM:
CHEST  2 VIEW

[chest lat]
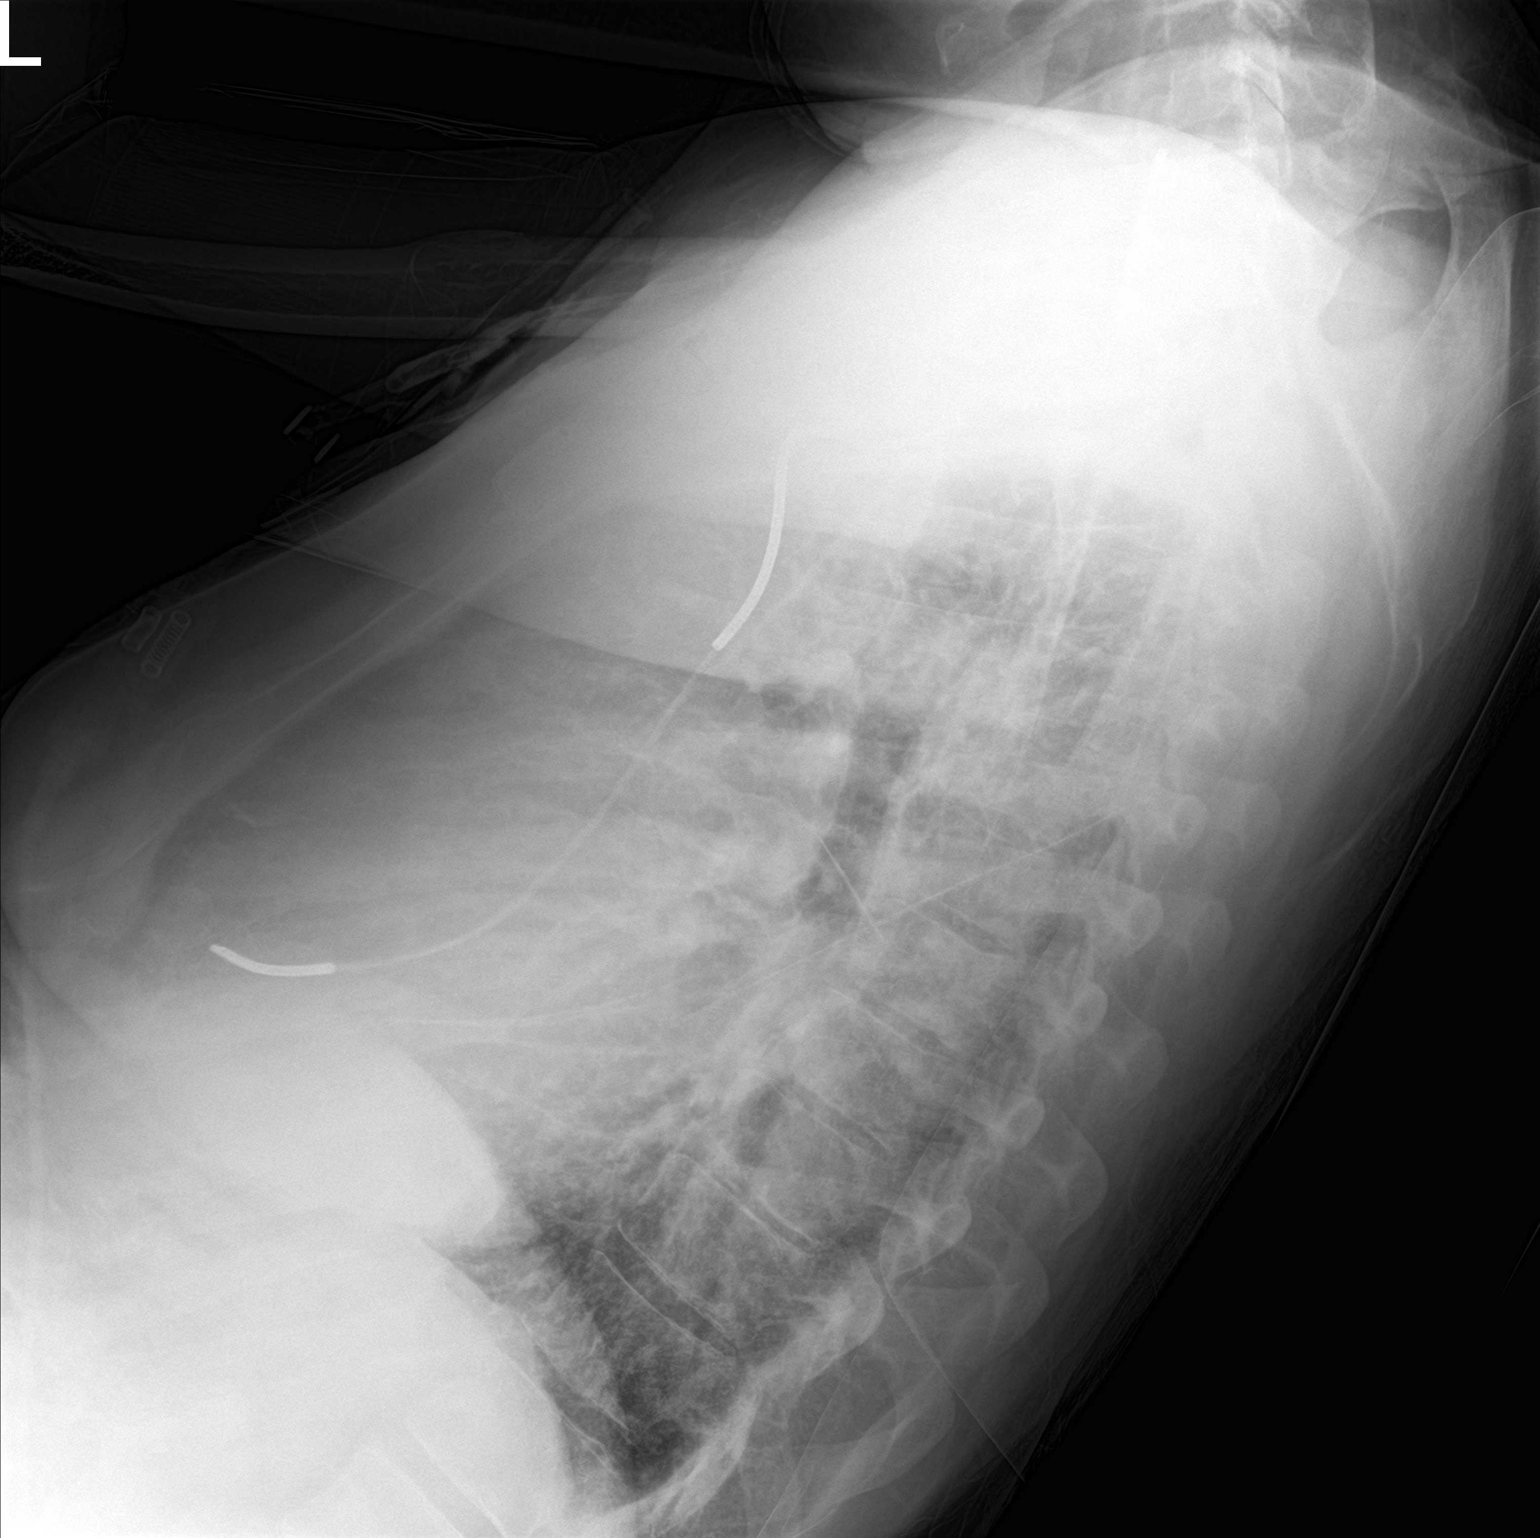

[chest ap]
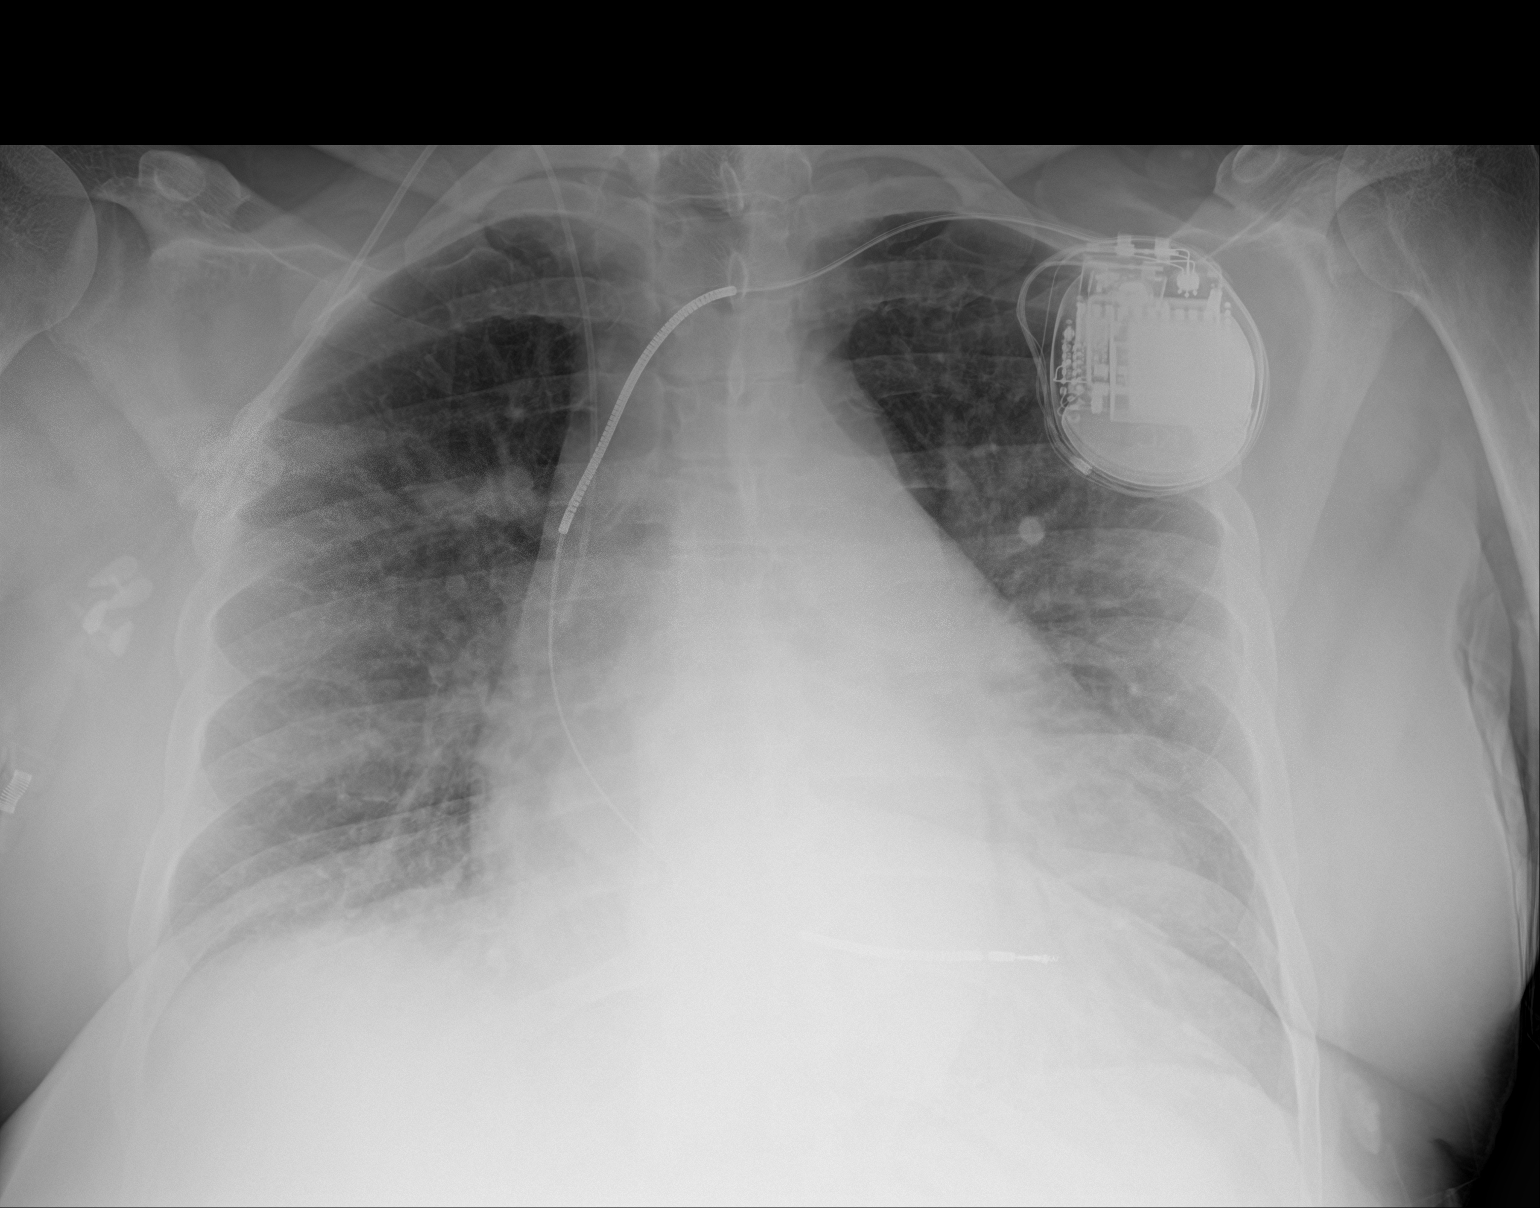

[2 of 2 positions shown; findings below may reference images not displayed]

FINDINGS: Stable mild cardiomegaly is noted. Left-sided pacemaker is unchanged
in position. Right internal jugular PICC line is also noted with
distal tip in expected position of the SVC. No pneumothorax or
pleural effusion is noted. No acute pulmonary disease is noted. Bony
thorax is unremarkable.
IMPRESSION: No active cardiopulmonary disease.

## 2016-06-29 IMAGING — CR DG CHEST 1V PORT
1 series · 1 of 1 positions shown · non-contrast
Comparison: 01/15/2015

CLINICAL DATA: Short of breath for 1 week. History of hypertension
and CHF well of sarcoidosis.

EXAM:
PORTABLE CHEST 1 VIEW

[AP]
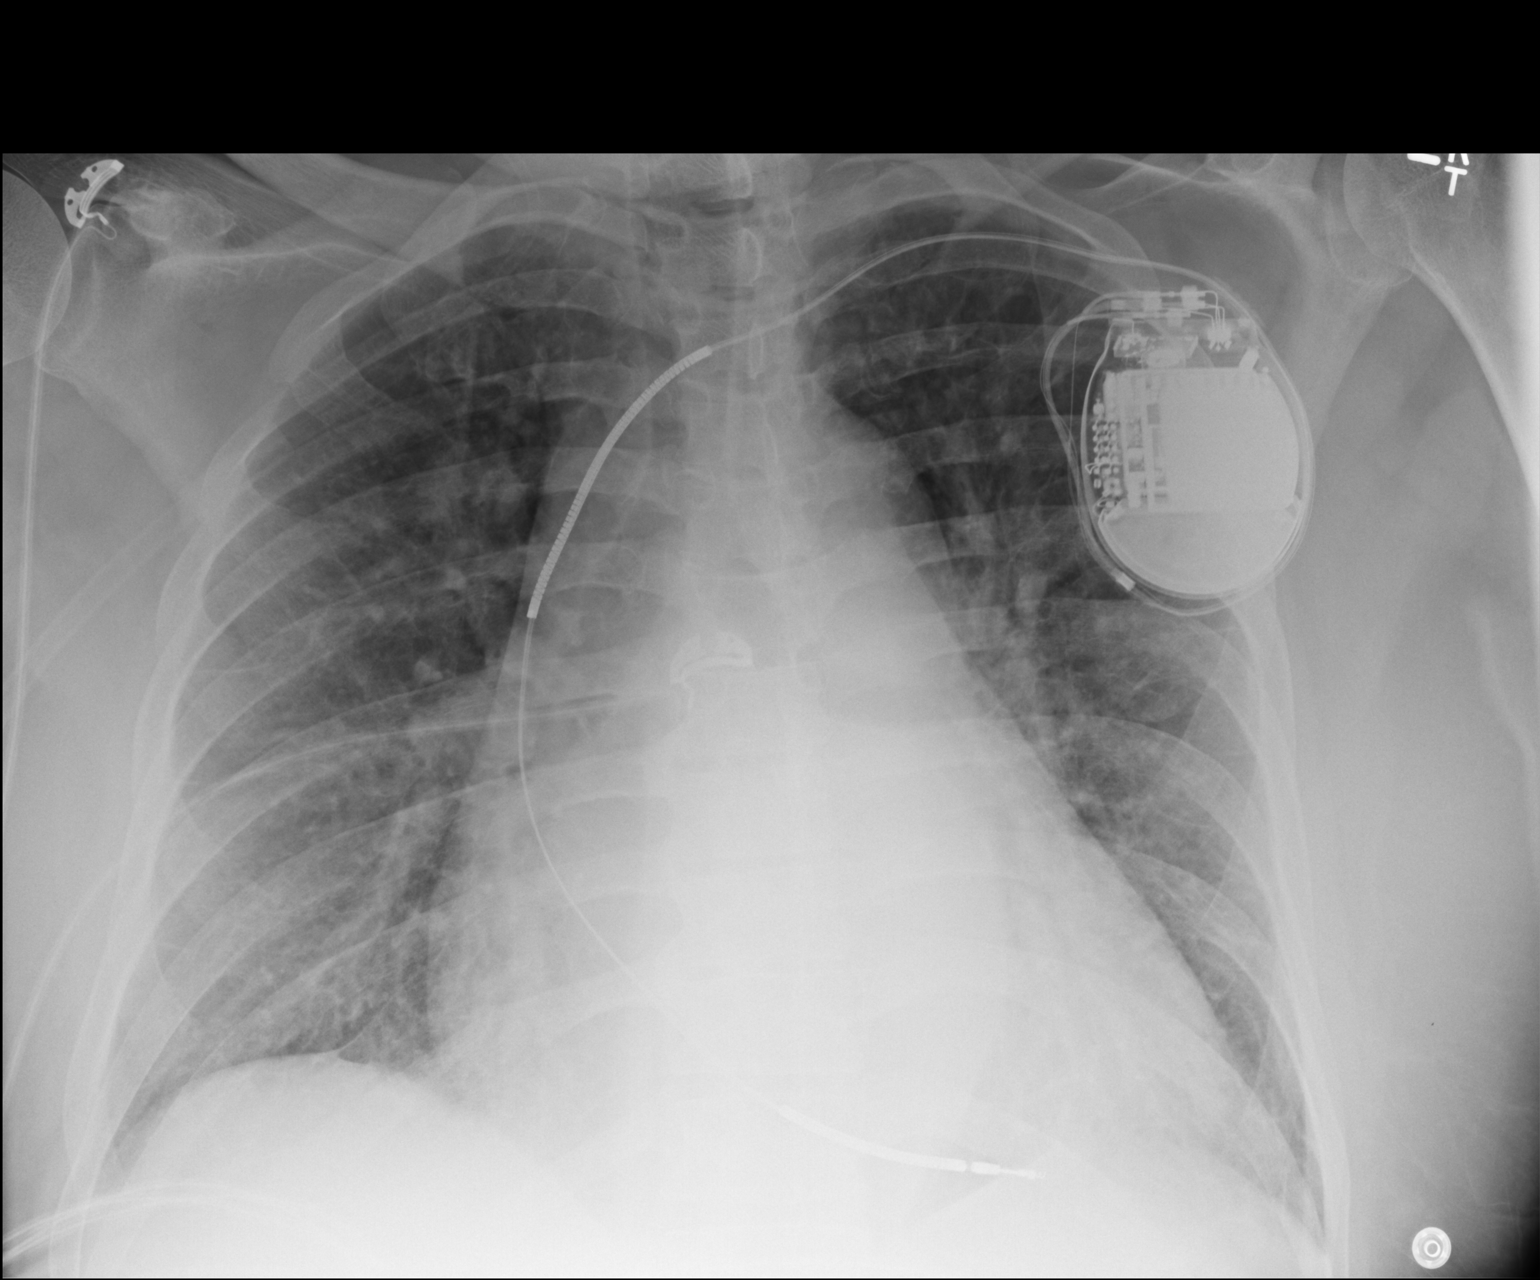

[1 of 1 positions shown; findings below may reference images not displayed]

FINDINGS: Stable enlargement of the cardiac silhouette. No mediastinal or
hilar masses or convincing adenopathy. Stable left anterior chest
wall AICD. Right central line has been removed since the prior
study.

There is thickening of the peribronchovascular interstitial markings
most evident in the lower lobes. No focal consolidation is seen to
suggest pneumonia. No pleural effusion or pneumothorax.
IMPRESSION: 1. Findings similar to the most recent prior exam. There is
cardiomegaly and mild interstitial thickening. This interstitial
thickening has increased when compared to an older prior study from
12/03/2014. Mild congestive heart failure with interstitial edema is
suspected.

## 2016-07-01 IMAGING — DX DG CHEST 1V PORT
1 series · 1 of 1 positions shown · non-contrast
Comparison: Single view of the chest 01/16/2015.

CLINICAL DATA: Central line placement.

EXAM:
PORTABLE CHEST 1 VIEW

[chest ap]
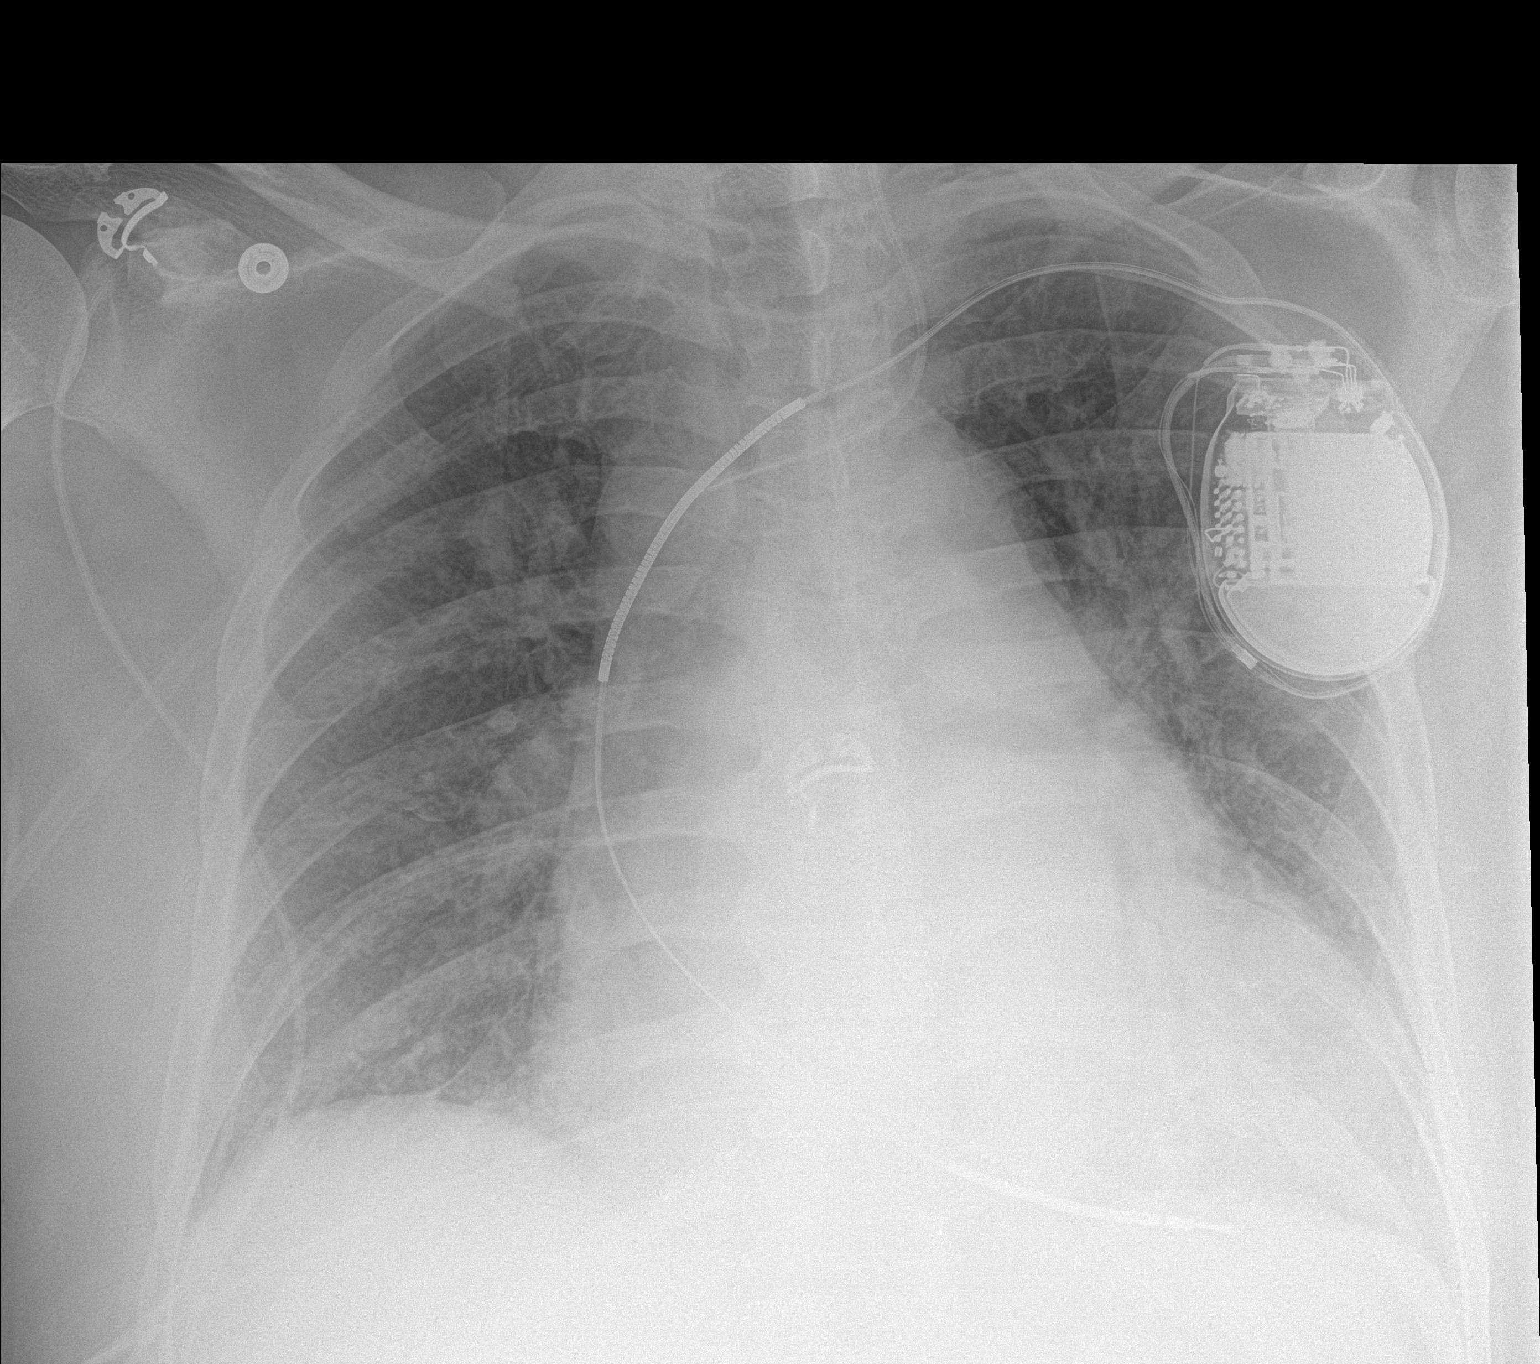

[1 of 1 positions shown; findings below may reference images not displayed]

FINDINGS: The patient has a new left IJ catheter. Tip of the catheter is just
within the superior vena cava. No pneumothorax.

There is cardiomegaly without edema. The lungs appear clear. AICD is
noted.
IMPRESSION: Left IJ catheter tip is just within the superior vena cava. Negative
for pneumothorax.

Cardiomegaly barium
